# Patient Record
Sex: Female | Born: 1960 | Race: White | Hispanic: No | Smoking: Former smoker
Health system: Southern US, Community
[De-identification: ages and names within clinical notes are randomized; demographics above are authoritative.]

## PROBLEM LIST (undated history)

## (undated) DIAGNOSIS — S72141A Displaced intertrochanteric fracture of right femur, initial encounter for closed fracture: Secondary | ICD-10-CM

## (undated) DIAGNOSIS — R05 Cough: Secondary | ICD-10-CM

## (undated) DIAGNOSIS — R300 Dysuria: Secondary | ICD-10-CM

## (undated) DIAGNOSIS — R0600 Dyspnea, unspecified: Secondary | ICD-10-CM

## (undated) DIAGNOSIS — F32A Depression, unspecified: Secondary | ICD-10-CM

## (undated) DIAGNOSIS — I10 Essential (primary) hypertension: Secondary | ICD-10-CM

## (undated) DIAGNOSIS — Z8781 Personal history of (healed) traumatic fracture: Secondary | ICD-10-CM

## (undated) DIAGNOSIS — E785 Hyperlipidemia, unspecified: Secondary | ICD-10-CM

## (undated) DIAGNOSIS — M810 Age-related osteoporosis without current pathological fracture: Secondary | ICD-10-CM

## (undated) DIAGNOSIS — F419 Anxiety disorder, unspecified: Secondary | ICD-10-CM

## (undated) DIAGNOSIS — F250 Schizoaffective disorder, bipolar type: Secondary | ICD-10-CM

## (undated) DIAGNOSIS — E28319 Asymptomatic premature menopause: Secondary | ICD-10-CM

## (undated) DIAGNOSIS — J449 Chronic obstructive pulmonary disease, unspecified: Secondary | ICD-10-CM

## (undated) DIAGNOSIS — M519 Unspecified thoracic, thoracolumbar and lumbosacral intervertebral disc disorder: Secondary | ICD-10-CM

## (undated) DIAGNOSIS — F329 Major depressive disorder, single episode, unspecified: Secondary | ICD-10-CM

## (undated) DIAGNOSIS — E119 Type 2 diabetes mellitus without complications: Secondary | ICD-10-CM

## (undated) DIAGNOSIS — D649 Anemia, unspecified: Secondary | ICD-10-CM

## (undated) DIAGNOSIS — J9611 Chronic respiratory failure with hypoxia: Secondary | ICD-10-CM

## (undated) DIAGNOSIS — E871 Hypo-osmolality and hyponatremia: Secondary | ICD-10-CM

## (undated) DIAGNOSIS — J9622 Acute and chronic respiratory failure with hypercapnia: Secondary | ICD-10-CM

## (undated) DIAGNOSIS — D751 Secondary polycythemia: Secondary | ICD-10-CM

## (undated) DIAGNOSIS — J9621 Acute and chronic respiratory failure with hypoxia: Secondary | ICD-10-CM

## (undated) DIAGNOSIS — R32 Unspecified urinary incontinence: Secondary | ICD-10-CM

## (undated) DIAGNOSIS — K219 Gastro-esophageal reflux disease without esophagitis: Secondary | ICD-10-CM

## (undated) DIAGNOSIS — C519 Malignant neoplasm of vulva, unspecified: Secondary | ICD-10-CM

## (undated) DIAGNOSIS — I509 Heart failure, unspecified: Secondary | ICD-10-CM

## (undated) DIAGNOSIS — M25559 Pain in unspecified hip: Secondary | ICD-10-CM

## (undated) HISTORY — DX: Essential (primary) hypertension: I10

## (undated) HISTORY — DX: Depression, unspecified: F32.A

## (undated) HISTORY — DX: Major depressive disorder, single episode, unspecified: F32.9

## (undated) HISTORY — DX: Gastro-esophageal reflux disease without esophagitis: K21.9

## (undated) HISTORY — DX: Type 2 diabetes mellitus without complications: E11.9

## (undated) HISTORY — PX: PELVIC FRACTURE SURGERY: SHX119

## (undated) HISTORY — PX: EYE SURGERY: SHX253

---

## 1997-11-21 ENCOUNTER — Emergency Department (HOSPITAL_COMMUNITY): Admission: EM | Admit: 1997-11-21 | Discharge: 1997-11-21 | Payer: Self-pay | Admitting: Emergency Medicine

## 1997-11-25 ENCOUNTER — Emergency Department (HOSPITAL_COMMUNITY): Admission: EM | Admit: 1997-11-25 | Discharge: 1997-11-25 | Payer: Self-pay

## 1997-12-03 ENCOUNTER — Inpatient Hospital Stay (HOSPITAL_COMMUNITY): Admission: EM | Admit: 1997-12-03 | Discharge: 1997-12-17 | Payer: Self-pay | Admitting: Emergency Medicine

## 1998-01-24 ENCOUNTER — Emergency Department (HOSPITAL_COMMUNITY): Admission: EM | Admit: 1998-01-24 | Discharge: 1998-01-24 | Payer: Self-pay | Admitting: Internal Medicine

## 1998-02-25 ENCOUNTER — Emergency Department (HOSPITAL_COMMUNITY): Admission: EM | Admit: 1998-02-25 | Discharge: 1998-02-25 | Payer: Self-pay | Admitting: Emergency Medicine

## 1998-03-03 ENCOUNTER — Encounter: Admission: RE | Admit: 1998-03-03 | Discharge: 1998-06-01 | Payer: Self-pay | Admitting: Orthopedic Surgery

## 1998-07-12 ENCOUNTER — Inpatient Hospital Stay (HOSPITAL_COMMUNITY): Admission: EM | Admit: 1998-07-12 | Discharge: 1998-07-13 | Payer: Self-pay | Admitting: Emergency Medicine

## 1998-07-14 ENCOUNTER — Emergency Department (HOSPITAL_COMMUNITY): Admission: EM | Admit: 1998-07-14 | Discharge: 1998-07-14 | Payer: Self-pay | Admitting: Emergency Medicine

## 1998-08-30 ENCOUNTER — Ambulatory Visit: Admission: RE | Admit: 1998-08-30 | Discharge: 1998-08-30 | Payer: Self-pay | Admitting: Gynecology

## 1998-08-30 ENCOUNTER — Other Ambulatory Visit: Admission: RE | Admit: 1998-08-30 | Discharge: 1998-08-30 | Payer: Self-pay | Admitting: Gynecology

## 1998-12-25 ENCOUNTER — Inpatient Hospital Stay (HOSPITAL_COMMUNITY): Admission: EM | Admit: 1998-12-25 | Discharge: 1999-01-06 | Payer: Self-pay | Admitting: Emergency Medicine

## 1999-01-08 ENCOUNTER — Emergency Department (HOSPITAL_COMMUNITY): Admission: EM | Admit: 1999-01-08 | Discharge: 1999-01-08 | Payer: Self-pay | Admitting: Emergency Medicine

## 1999-02-01 ENCOUNTER — Emergency Department (HOSPITAL_COMMUNITY): Admission: EM | Admit: 1999-02-01 | Discharge: 1999-02-01 | Payer: Self-pay | Admitting: Emergency Medicine

## 1999-07-06 ENCOUNTER — Emergency Department (HOSPITAL_COMMUNITY): Admission: EM | Admit: 1999-07-06 | Discharge: 1999-07-06 | Payer: Self-pay | Admitting: Emergency Medicine

## 1999-08-20 ENCOUNTER — Emergency Department (HOSPITAL_COMMUNITY): Admission: EM | Admit: 1999-08-20 | Discharge: 1999-08-20 | Payer: Self-pay | Admitting: Emergency Medicine

## 1999-08-21 ENCOUNTER — Emergency Department (HOSPITAL_COMMUNITY): Admission: EM | Admit: 1999-08-21 | Discharge: 1999-08-21 | Payer: Self-pay | Admitting: Emergency Medicine

## 1999-08-24 ENCOUNTER — Emergency Department (HOSPITAL_COMMUNITY): Admission: EM | Admit: 1999-08-24 | Discharge: 1999-08-24 | Payer: Self-pay | Admitting: Emergency Medicine

## 1999-08-31 ENCOUNTER — Emergency Department (HOSPITAL_COMMUNITY): Admission: EM | Admit: 1999-08-31 | Discharge: 1999-08-31 | Payer: Self-pay | Admitting: Emergency Medicine

## 1999-09-02 ENCOUNTER — Encounter: Payer: Self-pay | Admitting: Emergency Medicine

## 1999-09-02 ENCOUNTER — Emergency Department (HOSPITAL_COMMUNITY): Admission: EM | Admit: 1999-09-02 | Discharge: 1999-09-02 | Payer: Self-pay | Admitting: Emergency Medicine

## 1999-09-04 ENCOUNTER — Emergency Department (HOSPITAL_COMMUNITY): Admission: EM | Admit: 1999-09-04 | Discharge: 1999-09-04 | Payer: Self-pay | Admitting: Emergency Medicine

## 1999-09-09 ENCOUNTER — Emergency Department (HOSPITAL_COMMUNITY): Admission: EM | Admit: 1999-09-09 | Discharge: 1999-09-09 | Payer: Self-pay

## 1999-09-11 ENCOUNTER — Emergency Department (HOSPITAL_COMMUNITY): Admission: EM | Admit: 1999-09-11 | Discharge: 1999-09-11 | Payer: Self-pay | Admitting: Emergency Medicine

## 1999-09-11 ENCOUNTER — Emergency Department (HOSPITAL_COMMUNITY): Admission: EM | Admit: 1999-09-11 | Discharge: 1999-09-12 | Payer: Self-pay | Admitting: Emergency Medicine

## 1999-09-12 ENCOUNTER — Encounter: Payer: Self-pay | Admitting: Emergency Medicine

## 1999-09-12 ENCOUNTER — Emergency Department (HOSPITAL_COMMUNITY): Admission: EM | Admit: 1999-09-12 | Discharge: 1999-09-12 | Payer: Self-pay | Admitting: Emergency Medicine

## 1999-09-19 ENCOUNTER — Ambulatory Visit: Admission: RE | Admit: 1999-09-19 | Discharge: 1999-09-19 | Payer: Self-pay | Admitting: Gynecology

## 1999-09-19 ENCOUNTER — Encounter (INDEPENDENT_AMBULATORY_CARE_PROVIDER_SITE_OTHER): Payer: Self-pay

## 1999-09-19 ENCOUNTER — Other Ambulatory Visit: Admission: RE | Admit: 1999-09-19 | Discharge: 1999-09-19 | Payer: Self-pay | Admitting: Gynecology

## 1999-09-29 ENCOUNTER — Emergency Department (HOSPITAL_COMMUNITY): Admission: EM | Admit: 1999-09-29 | Discharge: 1999-09-29 | Payer: Self-pay | Admitting: Emergency Medicine

## 1999-10-10 ENCOUNTER — Emergency Department (HOSPITAL_COMMUNITY): Admission: EM | Admit: 1999-10-10 | Discharge: 1999-10-10 | Payer: Self-pay | Admitting: *Deleted

## 1999-10-10 ENCOUNTER — Ambulatory Visit: Admission: RE | Admit: 1999-10-10 | Discharge: 1999-10-10 | Payer: Self-pay | Admitting: Gynecologic Oncology

## 1999-11-03 ENCOUNTER — Emergency Department (HOSPITAL_COMMUNITY): Admission: EM | Admit: 1999-11-03 | Discharge: 1999-11-03 | Payer: Self-pay | Admitting: Emergency Medicine

## 1999-11-10 ENCOUNTER — Ambulatory Visit (HOSPITAL_COMMUNITY): Admission: RE | Admit: 1999-11-10 | Discharge: 1999-11-10 | Payer: Self-pay | Admitting: Gynecology

## 1999-11-10 ENCOUNTER — Encounter: Payer: Self-pay | Admitting: Gynecology

## 1999-11-14 ENCOUNTER — Encounter (INDEPENDENT_AMBULATORY_CARE_PROVIDER_SITE_OTHER): Payer: Self-pay

## 1999-12-20 ENCOUNTER — Ambulatory Visit: Admission: RE | Admit: 1999-12-20 | Discharge: 1999-12-20 | Payer: Self-pay | Admitting: Gynecologic Oncology

## 2000-04-26 ENCOUNTER — Emergency Department (HOSPITAL_COMMUNITY): Admission: EM | Admit: 2000-04-26 | Discharge: 2000-04-26 | Payer: Self-pay | Admitting: Emergency Medicine

## 2000-05-14 ENCOUNTER — Emergency Department (HOSPITAL_COMMUNITY): Admission: EM | Admit: 2000-05-14 | Discharge: 2000-05-15 | Payer: Self-pay | Admitting: Emergency Medicine

## 2000-05-18 ENCOUNTER — Emergency Department (HOSPITAL_COMMUNITY): Admission: EM | Admit: 2000-05-18 | Discharge: 2000-05-18 | Payer: Self-pay | Admitting: Emergency Medicine

## 2000-06-11 ENCOUNTER — Other Ambulatory Visit: Admission: RE | Admit: 2000-06-11 | Discharge: 2000-06-11 | Payer: Self-pay | Admitting: Gynecology

## 2000-06-11 ENCOUNTER — Encounter (INDEPENDENT_AMBULATORY_CARE_PROVIDER_SITE_OTHER): Payer: Self-pay | Admitting: Specialist

## 2000-06-11 ENCOUNTER — Ambulatory Visit: Admission: RE | Admit: 2000-06-11 | Discharge: 2000-06-11 | Payer: Self-pay | Admitting: Gynecology

## 2000-07-31 ENCOUNTER — Encounter: Admission: RE | Admit: 2000-07-31 | Discharge: 2000-07-31 | Payer: Self-pay | Admitting: Otolaryngology

## 2000-07-31 ENCOUNTER — Encounter: Payer: Self-pay | Admitting: Otolaryngology

## 2000-08-25 ENCOUNTER — Inpatient Hospital Stay (HOSPITAL_COMMUNITY): Admission: EM | Admit: 2000-08-25 | Discharge: 2000-09-04 | Payer: Self-pay | Admitting: *Deleted

## 2000-09-06 ENCOUNTER — Emergency Department (HOSPITAL_COMMUNITY): Admission: EM | Admit: 2000-09-06 | Discharge: 2000-09-06 | Payer: Self-pay | Admitting: Emergency Medicine

## 2000-09-15 ENCOUNTER — Emergency Department (HOSPITAL_COMMUNITY): Admission: EM | Admit: 2000-09-15 | Discharge: 2000-09-15 | Payer: Self-pay | Admitting: Emergency Medicine

## 2000-09-20 ENCOUNTER — Emergency Department (HOSPITAL_COMMUNITY): Admission: EM | Admit: 2000-09-20 | Discharge: 2000-09-20 | Payer: Self-pay | Admitting: Emergency Medicine

## 2000-10-11 ENCOUNTER — Emergency Department (HOSPITAL_COMMUNITY): Admission: EM | Admit: 2000-10-11 | Discharge: 2000-10-12 | Payer: Self-pay | Admitting: Emergency Medicine

## 2000-10-20 ENCOUNTER — Emergency Department (HOSPITAL_COMMUNITY): Admission: EM | Admit: 2000-10-20 | Discharge: 2000-10-20 | Payer: Self-pay | Admitting: Emergency Medicine

## 2001-02-07 ENCOUNTER — Inpatient Hospital Stay (HOSPITAL_COMMUNITY): Admission: AD | Admit: 2001-02-07 | Discharge: 2001-02-18 | Payer: Self-pay | Admitting: *Deleted

## 2001-02-19 ENCOUNTER — Encounter: Payer: Self-pay | Admitting: Internal Medicine

## 2001-02-19 ENCOUNTER — Inpatient Hospital Stay (HOSPITAL_COMMUNITY): Admission: EM | Admit: 2001-02-19 | Discharge: 2001-02-24 | Payer: Self-pay | Admitting: Emergency Medicine

## 2001-02-25 ENCOUNTER — Emergency Department (HOSPITAL_COMMUNITY): Admission: EM | Admit: 2001-02-25 | Discharge: 2001-02-25 | Payer: Self-pay | Admitting: Emergency Medicine

## 2001-02-28 ENCOUNTER — Emergency Department (HOSPITAL_COMMUNITY): Admission: EM | Admit: 2001-02-28 | Discharge: 2001-03-01 | Payer: Self-pay | Admitting: Emergency Medicine

## 2001-04-08 ENCOUNTER — Encounter (INDEPENDENT_AMBULATORY_CARE_PROVIDER_SITE_OTHER): Payer: Self-pay | Admitting: *Deleted

## 2001-04-08 ENCOUNTER — Other Ambulatory Visit: Admission: RE | Admit: 2001-04-08 | Discharge: 2001-04-08 | Payer: Self-pay | Admitting: Gynecologic Oncology

## 2001-04-08 ENCOUNTER — Ambulatory Visit: Admission: RE | Admit: 2001-04-08 | Discharge: 2001-04-08 | Payer: Self-pay | Admitting: Gynecologic Oncology

## 2001-05-18 ENCOUNTER — Inpatient Hospital Stay (HOSPITAL_COMMUNITY): Admission: EM | Admit: 2001-05-18 | Discharge: 2001-05-19 | Payer: Self-pay | Admitting: Emergency Medicine

## 2001-05-18 ENCOUNTER — Encounter: Payer: Self-pay | Admitting: Internal Medicine

## 2001-07-24 ENCOUNTER — Inpatient Hospital Stay (HOSPITAL_COMMUNITY): Admission: EM | Admit: 2001-07-24 | Discharge: 2001-08-07 | Payer: Self-pay | Admitting: *Deleted

## 2001-09-16 ENCOUNTER — Ambulatory Visit: Admission: RE | Admit: 2001-09-16 | Discharge: 2001-09-16 | Payer: Self-pay | Admitting: Gynecologic Oncology

## 2001-09-16 ENCOUNTER — Other Ambulatory Visit: Admission: RE | Admit: 2001-09-16 | Discharge: 2001-09-16 | Payer: Self-pay | Admitting: Gynecology

## 2002-05-08 ENCOUNTER — Encounter: Payer: Self-pay | Admitting: *Deleted

## 2002-05-08 ENCOUNTER — Emergency Department (HOSPITAL_COMMUNITY): Admission: EM | Admit: 2002-05-08 | Discharge: 2002-05-09 | Payer: Self-pay | Admitting: *Deleted

## 2002-05-19 ENCOUNTER — Encounter: Payer: Self-pay | Admitting: Emergency Medicine

## 2002-05-19 ENCOUNTER — Emergency Department (HOSPITAL_COMMUNITY): Admission: EM | Admit: 2002-05-19 | Discharge: 2002-05-19 | Payer: Self-pay | Admitting: Emergency Medicine

## 2002-06-02 ENCOUNTER — Emergency Department (HOSPITAL_COMMUNITY): Admission: EM | Admit: 2002-06-02 | Discharge: 2002-06-02 | Payer: Self-pay | Admitting: Emergency Medicine

## 2002-06-20 ENCOUNTER — Emergency Department (HOSPITAL_COMMUNITY): Admission: EM | Admit: 2002-06-20 | Discharge: 2002-06-20 | Payer: Self-pay | Admitting: Emergency Medicine

## 2002-06-20 ENCOUNTER — Encounter: Payer: Self-pay | Admitting: *Deleted

## 2002-06-30 ENCOUNTER — Emergency Department (HOSPITAL_COMMUNITY): Admission: EM | Admit: 2002-06-30 | Discharge: 2002-06-30 | Payer: Self-pay | Admitting: Emergency Medicine

## 2002-10-08 ENCOUNTER — Inpatient Hospital Stay (HOSPITAL_COMMUNITY): Admission: EM | Admit: 2002-10-08 | Discharge: 2002-10-15 | Payer: Self-pay | Admitting: Emergency Medicine

## 2002-10-09 ENCOUNTER — Encounter: Payer: Self-pay | Admitting: Internal Medicine

## 2002-10-13 ENCOUNTER — Encounter: Payer: Self-pay | Admitting: Internal Medicine

## 2002-10-23 ENCOUNTER — Emergency Department (HOSPITAL_COMMUNITY): Admission: EM | Admit: 2002-10-23 | Discharge: 2002-10-24 | Payer: Self-pay | Admitting: Emergency Medicine

## 2002-10-24 ENCOUNTER — Encounter: Payer: Self-pay | Admitting: Emergency Medicine

## 2002-11-20 ENCOUNTER — Observation Stay (HOSPITAL_COMMUNITY): Admission: EM | Admit: 2002-11-20 | Discharge: 2002-11-21 | Payer: Self-pay

## 2002-12-28 ENCOUNTER — Inpatient Hospital Stay (HOSPITAL_COMMUNITY): Admission: EM | Admit: 2002-12-28 | Discharge: 2002-12-31 | Payer: Self-pay | Admitting: *Deleted

## 2003-02-05 ENCOUNTER — Ambulatory Visit (HOSPITAL_COMMUNITY): Admission: RE | Admit: 2003-02-05 | Discharge: 2003-02-05 | Payer: Self-pay | Admitting: Endocrinology

## 2003-02-05 ENCOUNTER — Encounter: Payer: Self-pay | Admitting: Endocrinology

## 2003-02-17 ENCOUNTER — Encounter: Payer: Self-pay | Admitting: Endocrinology

## 2003-02-17 ENCOUNTER — Encounter: Admission: RE | Admit: 2003-02-17 | Discharge: 2003-02-17 | Payer: Self-pay | Admitting: Endocrinology

## 2003-03-04 ENCOUNTER — Ambulatory Visit (HOSPITAL_COMMUNITY): Admission: RE | Admit: 2003-03-04 | Discharge: 2003-03-04 | Payer: Self-pay | Admitting: Endocrinology

## 2003-03-04 ENCOUNTER — Encounter: Payer: Self-pay | Admitting: Endocrinology

## 2003-04-16 ENCOUNTER — Inpatient Hospital Stay (HOSPITAL_COMMUNITY): Admission: EM | Admit: 2003-04-16 | Discharge: 2003-04-19 | Payer: Self-pay | Admitting: Emergency Medicine

## 2003-04-16 ENCOUNTER — Encounter: Payer: Self-pay | Admitting: Emergency Medicine

## 2003-08-15 ENCOUNTER — Emergency Department (HOSPITAL_COMMUNITY): Admission: EM | Admit: 2003-08-15 | Discharge: 2003-08-16 | Payer: Self-pay | Admitting: Unknown Physician Specialty

## 2003-09-07 ENCOUNTER — Other Ambulatory Visit: Admission: RE | Admit: 2003-09-07 | Discharge: 2003-09-07 | Payer: Self-pay | Admitting: Gynecology

## 2003-09-07 ENCOUNTER — Ambulatory Visit: Admission: RE | Admit: 2003-09-07 | Discharge: 2003-09-07 | Payer: Self-pay | Admitting: Gynecology

## 2003-10-04 ENCOUNTER — Ambulatory Visit (HOSPITAL_COMMUNITY): Admission: RE | Admit: 2003-10-04 | Discharge: 2003-10-04 | Payer: Self-pay | Admitting: Endocrinology

## 2003-11-26 ENCOUNTER — Emergency Department (HOSPITAL_COMMUNITY): Admission: EM | Admit: 2003-11-26 | Discharge: 2003-11-26 | Payer: Self-pay | Admitting: Emergency Medicine

## 2003-12-17 ENCOUNTER — Inpatient Hospital Stay (HOSPITAL_COMMUNITY): Admission: AD | Admit: 2003-12-17 | Discharge: 2003-12-22 | Payer: Self-pay | Admitting: Psychiatry

## 2004-01-10 ENCOUNTER — Emergency Department (HOSPITAL_COMMUNITY): Admission: EM | Admit: 2004-01-10 | Discharge: 2004-01-10 | Payer: Self-pay | Admitting: Emergency Medicine

## 2004-02-18 ENCOUNTER — Emergency Department (HOSPITAL_COMMUNITY): Admission: EM | Admit: 2004-02-18 | Discharge: 2004-02-18 | Payer: Self-pay | Admitting: Emergency Medicine

## 2004-05-16 ENCOUNTER — Emergency Department (HOSPITAL_COMMUNITY): Admission: EM | Admit: 2004-05-16 | Discharge: 2004-05-16 | Payer: Self-pay | Admitting: Emergency Medicine

## 2004-08-02 ENCOUNTER — Ambulatory Visit: Payer: Self-pay | Admitting: Endocrinology

## 2004-09-13 ENCOUNTER — Ambulatory Visit: Payer: Self-pay | Admitting: Endocrinology

## 2004-10-06 ENCOUNTER — Emergency Department (HOSPITAL_COMMUNITY): Admission: EM | Admit: 2004-10-06 | Discharge: 2004-10-06 | Payer: Self-pay | Admitting: Emergency Medicine

## 2004-10-10 ENCOUNTER — Ambulatory Visit: Payer: Self-pay | Admitting: Endocrinology

## 2004-12-07 ENCOUNTER — Ambulatory Visit: Payer: Self-pay | Admitting: Endocrinology

## 2005-01-15 ENCOUNTER — Ambulatory Visit: Payer: Self-pay | Admitting: Endocrinology

## 2005-02-17 ENCOUNTER — Ambulatory Visit: Payer: Self-pay | Admitting: Internal Medicine

## 2005-08-07 ENCOUNTER — Encounter: Payer: Self-pay | Admitting: Emergency Medicine

## 2005-08-08 ENCOUNTER — Inpatient Hospital Stay (HOSPITAL_COMMUNITY): Admission: AD | Admit: 2005-08-08 | Discharge: 2005-08-16 | Payer: Self-pay | Admitting: Psychiatry

## 2005-08-09 ENCOUNTER — Ambulatory Visit: Payer: Self-pay | Admitting: Psychiatry

## 2005-08-13 ENCOUNTER — Encounter: Payer: Self-pay | Admitting: Family Medicine

## 2005-08-15 ENCOUNTER — Encounter (INDEPENDENT_AMBULATORY_CARE_PROVIDER_SITE_OTHER): Payer: Self-pay | Admitting: Specialist

## 2005-08-15 ENCOUNTER — Other Ambulatory Visit: Admission: RE | Admit: 2005-08-15 | Discharge: 2005-08-15 | Payer: Self-pay | Admitting: Obstetrics and Gynecology

## 2005-09-15 ENCOUNTER — Inpatient Hospital Stay (HOSPITAL_COMMUNITY): Admission: EM | Admit: 2005-09-15 | Discharge: 2005-09-18 | Payer: Self-pay | Admitting: Internal Medicine

## 2005-09-18 ENCOUNTER — Inpatient Hospital Stay (HOSPITAL_COMMUNITY): Admission: EM | Admit: 2005-09-18 | Discharge: 2005-09-25 | Payer: Self-pay | Admitting: Psychiatry

## 2005-09-23 ENCOUNTER — Ambulatory Visit: Payer: Self-pay | Admitting: Psychiatry

## 2005-09-30 ENCOUNTER — Emergency Department (HOSPITAL_COMMUNITY): Admission: EM | Admit: 2005-09-30 | Discharge: 2005-09-30 | Payer: Self-pay | Admitting: Emergency Medicine

## 2006-01-15 ENCOUNTER — Emergency Department (HOSPITAL_COMMUNITY): Admission: EM | Admit: 2006-01-15 | Discharge: 2006-01-16 | Payer: Self-pay | Admitting: Emergency Medicine

## 2006-08-23 ENCOUNTER — Ambulatory Visit: Payer: Self-pay | Admitting: Endocrinology

## 2007-04-08 ENCOUNTER — Encounter: Payer: Self-pay | Admitting: *Deleted

## 2007-04-08 DIAGNOSIS — C519 Malignant neoplasm of vulva, unspecified: Secondary | ICD-10-CM

## 2007-04-08 DIAGNOSIS — I10 Essential (primary) hypertension: Secondary | ICD-10-CM

## 2007-04-08 DIAGNOSIS — E119 Type 2 diabetes mellitus without complications: Secondary | ICD-10-CM | POA: Insufficient documentation

## 2007-04-08 DIAGNOSIS — R300 Dysuria: Secondary | ICD-10-CM | POA: Insufficient documentation

## 2007-04-08 DIAGNOSIS — E28319 Asymptomatic premature menopause: Secondary | ICD-10-CM | POA: Insufficient documentation

## 2007-04-08 DIAGNOSIS — F259 Schizoaffective disorder, unspecified: Secondary | ICD-10-CM

## 2007-04-08 DIAGNOSIS — K219 Gastro-esophageal reflux disease without esophagitis: Secondary | ICD-10-CM

## 2007-04-08 DIAGNOSIS — J4489 Other specified chronic obstructive pulmonary disease: Secondary | ICD-10-CM

## 2007-04-08 DIAGNOSIS — J449 Chronic obstructive pulmonary disease, unspecified: Secondary | ICD-10-CM | POA: Insufficient documentation

## 2007-04-08 DIAGNOSIS — F329 Major depressive disorder, single episode, unspecified: Secondary | ICD-10-CM | POA: Insufficient documentation

## 2007-04-08 DIAGNOSIS — M810 Age-related osteoporosis without current pathological fracture: Secondary | ICD-10-CM

## 2007-04-08 DIAGNOSIS — E871 Hypo-osmolality and hyponatremia: Secondary | ICD-10-CM | POA: Insufficient documentation

## 2007-04-08 HISTORY — DX: Hypo-osmolality and hyponatremia: E87.1

## 2007-04-08 HISTORY — DX: Other specified chronic obstructive pulmonary disease: J44.89

## 2007-04-08 HISTORY — DX: Asymptomatic premature menopause: E28.319

## 2007-04-08 HISTORY — DX: Malignant neoplasm of vulva, unspecified: C51.9

## 2007-04-08 HISTORY — DX: Essential (primary) hypertension: I10

## 2007-04-08 HISTORY — DX: Chronic obstructive pulmonary disease, unspecified: J44.9

## 2007-04-08 HISTORY — DX: Age-related osteoporosis without current pathological fracture: M81.0

## 2007-09-04 ENCOUNTER — Inpatient Hospital Stay (HOSPITAL_COMMUNITY): Admission: EM | Admit: 2007-09-04 | Discharge: 2007-09-08 | Payer: Self-pay | Admitting: Emergency Medicine

## 2007-09-04 ENCOUNTER — Ambulatory Visit: Payer: Self-pay | Admitting: Internal Medicine

## 2007-09-12 ENCOUNTER — Inpatient Hospital Stay (HOSPITAL_COMMUNITY): Admission: EM | Admit: 2007-09-12 | Discharge: 2007-09-16 | Payer: Self-pay | Admitting: Emergency Medicine

## 2007-09-16 ENCOUNTER — Ambulatory Visit: Payer: Self-pay | Admitting: Psychiatry

## 2007-09-25 ENCOUNTER — Observation Stay (HOSPITAL_COMMUNITY): Admission: EM | Admit: 2007-09-25 | Discharge: 2007-09-26 | Payer: Self-pay | Admitting: Emergency Medicine

## 2007-10-01 ENCOUNTER — Encounter: Payer: Self-pay | Admitting: Endocrinology

## 2007-12-09 ENCOUNTER — Emergency Department (HOSPITAL_COMMUNITY): Admission: EM | Admit: 2007-12-09 | Discharge: 2007-12-09 | Payer: Self-pay | Admitting: Emergency Medicine

## 2007-12-12 ENCOUNTER — Ambulatory Visit: Payer: Self-pay | Admitting: Endocrinology

## 2007-12-12 DIAGNOSIS — M25559 Pain in unspecified hip: Secondary | ICD-10-CM

## 2007-12-15 ENCOUNTER — Encounter (INDEPENDENT_AMBULATORY_CARE_PROVIDER_SITE_OTHER): Payer: Self-pay | Admitting: *Deleted

## 2007-12-17 ENCOUNTER — Emergency Department (HOSPITAL_COMMUNITY): Admission: EM | Admit: 2007-12-17 | Discharge: 2007-12-17 | Payer: Self-pay | Admitting: Emergency Medicine

## 2008-01-01 ENCOUNTER — Inpatient Hospital Stay (HOSPITAL_COMMUNITY): Admission: RE | Admit: 2008-01-01 | Discharge: 2008-01-16 | Payer: Self-pay | Admitting: Neurosurgery

## 2008-01-01 ENCOUNTER — Encounter: Payer: Self-pay | Admitting: Emergency Medicine

## 2010-07-17 ENCOUNTER — Encounter: Payer: Self-pay | Admitting: Family Medicine

## 2010-11-07 NOTE — H&P (Signed)
NAME:  Natalie Thomas, Natalie Thomas            ACCOUNT NO.:  [id number]   MEDICAL RECORD NO.:  [id number]          PATIENT TYPE:  INP   LOCATION:  1444                         FACILITY:  [facility]   PHYSICIAN:  Kela Millin, M.D.DATE OF BIRTH:  Jul 10, 1960   DATE OF ADMISSION:  09/04/2007  DATE OF DISCHARGE:                              HISTORY & PHYSICAL   CHIEF COMPLAINT:  Altered mental status/hostile behavior.   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  The history is  obtained from ER staff and review of ER records secondary to her altered  mental status.  It is reported that the [facility] Police brought the  patient to the emergency room for medical clearance from [facility]  stating that she had been very aggressive and threatening to others.  Upon arrival, it was noted that the patient was agitated, shaking and in  handcuffs and was loud with agitated voice.  In the ER, lab work was  obtained for medical clearance and her sodium was 120 with a potassium  of 2.6, a urine drug screen was negative and she is admitted for further  evaluation and management.  She admits to drinking lots of water, but  otherwise, she does not answer the questions she is asked, very  talkative about unrelated matters.  She is admitted to the medicine  service for further evaluation and management of her electrolyte  imbalances.   PAST MEDICAL HISTORY:  As above.   MEDICATIONS:  1. Cardizem 180 mg daily.  2. Depakote  next episode 1500 mg nightly.  3. Protonix 40 mg daily.  4. Allegra 10 mg daily.  5. Topamax 100 mg b.i.d.  6. Trilafon 8 mg b.i.d.  7. Klonopin 0.25 mg b.i.d.  8. Flovent 110 mcg b.i.d.  9. Colace  100 mg daily.  10.Seroquel 300 mg at bedtime and 200 mg t.i.d.  11.Albuterol p.r.n.  12.Hydrochlorothiazide 25 mg daily.  13.Trilafon 16 mg nightly.  14.Vitamin D 1000 units daily.  15.Symbicort 2 puffs b.i.d.  16.Advair Diskus 100/50 b.i.d.   ALLERGIES:  PENICILLIN per old records.   SOCIAL HISTORY:  Per old records.  No tobacco and no alcohol.   FAMILY HISTORY:  Unobtainable.   PHYSICAL EXAMINATION:  GENERAL:  The patient is a middle-aged white  female, obese, in no apparent distress.  She is very talkative and  confrontational, but not answering the questions asked.  VITAL SIGNS:  Temperature is 97.3, blood pressure 148/81, pulse of 98,  respiratory rate of 20, O2 saturations 95%.  HEENT:  PERRLA, EOMI, sclerae anicteric.  LUNGS:  Decreased air entry, rhonchi present, no wheezes.  CARDIOVASCULAR:  Regular rate and rhythm.  Normal S1, S2.  ABDOMEN:  Obese, bowel sounds present, nontender, nondistended.  No  organomegaly and no masses palpable.  EXTREMITIES:  Trace edema.  No cyanosis.  NEUROLOGIC:  She is alert.  She does not  answer any of the questions  regarding orientation.  She does not follow commands.  Limited  neurologic exam as the patient is not corporative.   LABORATORY DATA AND X-RAY FINDINGS:  Chest x-ray with mild cardiomegaly,  no edema, COPD/emphysematous changes noted.   Urinalysis shows a specific gravity of 1.004, otherwise negative.  White  cell count is 8.4, hemoglobin is 16.2, hematocrit 46, platelet count is  251, neutrophil count 74%.  Urine drug screen is negative.  Dilantin  level is less than 2.5.  Salicylate level is less than 4.  Tylenol level  less than 10.  Sodium is 120, potassium 2.6, chloride 85, CO2 is 29,  glucose 137, BUN 3, creatinine 0.45, calcium is 9.7, total protein is  7.3.  Valproic acid is 65.  Alcohol level is less than 5.  No tricyclics  detected.   ASSESSMENT/PLAN:  1. Hyponatremia.  As discussed above, she has a history of  psychogenic      polydipsia and admits to drinking a lot of water.  She is also on      hydrochlorothiazide.  Will hold hydrochlorothiazide for now.      Obtain urine electrolytes.  Also, obtain a TSH.  Will initially      hydrate with normal saline, follow recheck and fluid restrict as      appropriate for further management.  2. Altered mental status.  Likely contributing, but the patient also      has schizoaffective disorder with multiple psychiatric admissions.      Urinalysis and chest x-ray are negative for infection.  Treat      hyponatremia as above.  Consult psychiatry once medically cleared      for a possible Behavioral Health transfer.  3. Hypokalemia.  Replace potassium (the patient has been on      hydrochlorothiazide as above).  4. Bronchitis/chronic obstructive pulmonary disease.  Nebulized      bronchodilators.  Add Mucinex.  Continue Advair and Flovent.  5. Hypertension.  Continue outpatient medications.  6. Epilepsy.  Continue outpatient medications.  7. Diabetes.  On no medications.  Will monitor Accu-Cheks.  Cover with      sliding scale and obtain a hemoglobin A1c.  Further manage as      appropriate.      Kela Millin, M.D.  Electronically Signed     ACV/MEDQ  D:  09/05/2007  T:  09/05/2007  Job:  [id number]   cc:   Gregary Signs A. Everardo All, MD  [address]  [name]  [place] [address]

## 2010-11-07 NOTE — Discharge Summary (Signed)
NAME:  Natalie Thomas, Natalie Thomas NO.:  [id number]   MEDICAL RECORD NO.:  [id number]          PATIENT TYPE:  INP   LOCATION:  1505                         FACILITY:  [facility]   PHYSICIAN:  Bennett Scrape, MD  DATE OF BIRTH:  1960-10-31   DATE OF ADMISSION:  09/04/2007  DATE OF DISCHARGE:  09/08/2007                               DISCHARGE SUMMARY   PRIMARY DISCHARGE DIAGNOSIS:  1. Hyponatremia from psychogenic polydipsia.  2. Hypertension.  3. Hypokalemia.  4. Schizoaffective disorder.  5. Chronic seizure disorder.  6. Tobacco abuse.  7. Chronic obstructive pulmonary disease.  8. Diet-controlled diabetes.  9. Obesity.   PROCEDURES DONE IN THIS HOSPITALIZATION:  Chest x-ray at the time of  admission  which showed COPD changes and mild cardiomegaly.   CONSULTANTS IN THIS HOSPITALIZATION:  Psychiatry.   HISTORY OF PRESENT ILLNESS:  Ms. Natalie Thomas is a 50 year old lady  who presented to the ER with altered mental status and very hostile  behavior on March 12.  She lives in a group home.  At the time of  admission to the ER, the patient was brought there in restraint by  police from [facility].  She lives in the group home, and  had been  very aggressive and threatening to other people.  Upon arrival, it was  noted that the patient was very agitated, shaking, was in handcuffs, was  loud with agitated voice.  In the ER, labs were drawn and showed her low  sodium at 120 with a low potassium of 2.6.  Drug screen was negative.  The patient did admit to drinking lots of water, especially diet  Coke.She had history of past admissions for hyponatremia from  psychogenic polydipsia.  She was admitted here for treatment of  hyponatremia, hypokalemia, and the mental status changes.  The patient  was also on  HCTZ, for hypertension and it was discontinued during this  hospitalization as HCTZ can worsen hyponatremia.  For hypokalemia, she  was given some potassium  supplement which improved that.   HOSPITAL COURSE:  The patient also got some IV fluids with normal  saline, and that improved her sodium from 127 at the time of admission  to being 134 today at the time of discharge.  She was also started on  fluid restrictionof only 1500 ml per day.  Her blood pressure was  elevated as HCTZ was discontinued.  Her Cardizem dose was increased from  180 mg to 240 mg which controlled her blood pressure.  For her  hypokalemia, she received oral potassium supplements, and today at the  time of discharge, her potassium is 4.7.  Her other medical problems  included COPD, epilepsy, obesity.  They all remained stable throughout  this hospitalization.  Accu-Cheks were done for her history of diabetes.  She was  put on a no concentrated sweets diet.  Her blood sugar remained  under 150 during this hospitalization.  Her hemoglobin A1c was obtained  which was 5.9%.  TSH was obtained which was normal at 0.968.  Her FENA  was low 0.0002% with serum osmolality being slightly low at  257.  The  uterine osmolality was very low at 78.  Psychiatry was consulted,  and  wanted to follow the patient as an outpatient and recommended discharge  of the patient to the assisted living.   DISCHARGE CONDITION:  Stable.  The patient is much calmer and awake and  alert on the current medication.   LABS AT THE TIME OF DISCHARGE:  Sodium of 134, potassium of 4.7,  chloride of 105, CO2 of 23, BUN of 6, creatinine of 0.5 with a glucose  of 109.   DIET:  She should follow a healthy-heart diet.  No concentrated sweets  and try to avoid any concentrated sugars which will affect her diabetes.   FOLLOW UP:  The patient wants to see her primary care Natalie Thomas at the  scheduled appointment.  She will also follow up with psychiatry.  She  should return to the ER if she develops any more confusion, fever, any  chest pain, any shortness of breath.   DISCHARGE MEDICATIONS:  1. Cardizem CD 240  mg p.o. q day.  2. Depakote 1500 mg q h.s.  3. Protonix 40 mg p.o. q d.  4. Claritin 10 mg daily.  5. Topamax 100 mg twice a day.  6. Trilafon 8 mg tablets, 2 tablets at bedtime.  7. Colace 100 mg tablets daily.  8. Seroquel 300 mg tablets at bedtime, and 200 mg tablets three times      a day.  9. Albuterol inhaler 2 puffs three times a day as needed.  10.Vitamin D 3 tablets 1000 unit tablets daily.  11.Symbicort 2 puffs inhaled twice a day.      Bennett Scrape, MD  Electronically Signed     VPC/MEDQ  D:  09/08/2007  T:  09/08/2007  Job:  [id number]

## 2010-11-07 NOTE — Discharge Summary (Signed)
NAME:  Natalie Thomas, SCHLAUCH NO.:  [id number]   MEDICAL RECORD NO.:  [id number]          PATIENT TYPE:  INP   LOCATION:  1518                         FACILITY:  [facility]   PHYSICIAN:  Bennett Scrape, MD  DATE OF BIRTH:  02-Jun-1961   DATE OF ADMISSION:  09/12/2007  DATE OF DISCHARGE:  09/16/2007                               DISCHARGE SUMMARY   Date of admission 09/12/07  Date of discharge 09/16/07  Attending physician- Dr Debby Bud.   PRIMARY DISCHARGE DIAGNOSIS:  Hyponatremia secondary to psychogenic  polydipsia.   OTHER DISCHARGE DIAGNOSES:  1. Hypertension  2. Diet-controlled diabetes.  3. Chronic obstructive pulmonary disease with history of tobacco      abuse.  4. History of chronic seizure disorder.  5. History of schizoaffective disorder.  6. Hypokalemia.   CONSULTANTS IN THIS HOSPITALIZATION:  None.   PROCEDURES DONE IN THIS HOSPITALIZATION:  A chest x-ray at the time of  admission which showed stable cardiomegaly with no active lung disease.   HISTORY OF PRESENT ILLNESS:  Ms. Natalie Thomas is a 50 year old female with  extensive psychiatric history of schizoaffective disorder and history of  multiple admissions in the past for hyponatremia and behavioral issues.  The patient currently lives in a group home.  She was discharged four  days ago prior to her readmission for this time.  She was sent from a  group home because of episodes of increasing agitation and violent  behavior.  The patient complained that she was not feeling well.   PHYSICAL EXAMINATION:  GENERAL:  The patient was obese, slightly  confused, talkative and did not appear in any acute distress.  VITAL SIGNS:  As follows:  Blood pressure 130/80, pulse of 88,  temperature of 98.  HEAD AND NECK EXAM:  Showed pupils equally reactive to light reflex.  NECK EXAM:  No JVD, no bruit, no thyromegaly was noted.  LUNG EXAM:  Clear.  There was no respiratory distress or wheezing.  CARDIOVASCULAR EXAM:  The  rate and rhythm were regular.  S1 and S2 were  normal.  ABDOMEN:  Soft, nontender, bowel sounds were present.  EXTREMITIES:  Showed mild 1+ edema with palpable pulses palpable.  NEUROLOGICAL EXAM:  Showed no motor deficit.  She was alert, although  she was confused.  Cranial nerves II-XII were intact.   LABS ON ADMISSION:  Showed white cell count of 7.2 with a hemoglobin of  16, hematocrit of 46.2.  Cardiac enzymes were all normal.  Serum alcohol  level was 5 which was low.  Chemistry showed a sodium of 118 and a  potassium of 3.4.  BUN was 4 and creatinine was 0.52.  She was  saturating 95% on room air.  The patient was diagnosed with severe  hyponatremia secondary to psychogenic polydipsia.  She was admitted for  this problem, and she was put on a no concentrated sweets diet with 1000  ml of fluid restriction.   HOSPITAL COURSE:  1. Severe hyponatremia secondary to polydipsia.  The patient's sodium      responded well to fluid restriction.  Her sodium improved from 118  to a high of 134, but the patient has been very noncompliant with      fluid restriction and keeps on asking for Diet Coke at her bedside.      Today at the time of discharge, her sodium is 128.  2. Hypertension.  Her blood pressure remains labile but is well      controlled on Cardizem of 240 mg q day.  3. History of COPD, she was continued on a p.r.n. inhaler, and that      remained stable.  4. History of schizoaffective disorder and chronic seizure disorder.      She was continued on her home medications.  5. Type 2 diabetes.  She was well controlled with diet only.  6. Hypokalemia.  She was given some extra potassium supplements.   LABORATORY DATA:  At the time of discharge: Sodium of 128, potassium of  4.3, BUN of 5, creatinine of 0.45, glucose of 77.   DISCHARGE CONDITION:  Stable.   DIET:  She should eat a healthy heart, no concentrated sweets diet.   FOLLOW UP:  The patient will follow up with her  physician and her  psychiatrist at the group home where she lives.  She will need basic  metabolic panel drawn in a week from discharge to follow up on her  hyponatremia and hypokalemia.  She should return to the ER if she  develops any fever of more than 101, any shortness of breath, any chest  pain, any seizures.   DISCHARGE MEDICATIONS:  1. Cardizem CD 240 mg p.o. q d.  2. Seroquel 100 mg tablets, 2 tablets three times a day.  3. Symbicort inhalers.  4. Albuterol inhaler three times a day as needed.  5. There is also an extra dose of Seroquel 100 mg 3 tablets at      bedtime.  6. Protonix 40 mg p.o. q d.  7. Topamax 100 mg twice a day.  8. Os-cal 500 mg three times a day.  9. Vitamin D tablet 1000 units daily.  10.Depakote ER 500 mg tablets three tablets at bedtime.  11.Theragran 1 tablet daily.  12.Perphenazine 4 mg tablets 2 tablets daily and 6 mg perphenazine      tablets at bedtime.  13.Her HCTZ has been discontinued because of her hyponatremia.  14.Clonazepam 0.5 mg twice a day.  15.Potassium chloride tablet 20 mEq tablet 1 tablet p.o. b.i.d.  16.Fluid restriction 1041ml/day. She needs to limit intake of diet coke  which she tends to drink excessively if kept at her bedside.      Bennett Scrape, MD  Electronically Signed     VPC/MEDQ  D:  09/16/2007  T:  09/16/2007  Job:  [id number]

## 2010-11-07 NOTE — H&P (Signed)
NAME:  Natalie Thomas, Natalie Thomas NO.:  [id number]   MEDICAL RECORD NO.:  [id number]          PATIENT TYPE:  INP   LOCATION:  1304                         [facility]  [facility]   PHYSICIAN:  Gordy Savers, MDDATE OF BIRTH:  [birth year]   DATE OF ADMISSION:  09/12/2007  DATE OF DISCHARGE:                              HISTORY & PHYSICAL   CHIEF COMPLAINT:  Confusion.   HISTORY OF PRESENT ILLNESS:  The patient is a 50 year old female with an  extensive psychiatric history significant for schizoaffective disorder  and history of multiple previous admissions for behavioral health  issues.  The patient was discharged from the hospital 4 days ago after  being admitted with altered mental status secondary to severe  hyponatremia resulting from psychogenic polydipsia.  The patient has  been a resident of [facility] and reportedly today became  much more agitated with episodes of crying and due to change in her  mental status was referred to the emergency department.  The patient  states that she feels unwell but is not able to give any specific  complaints other than the soles of her feet burning.  The patient was  admitted on September 04, 2007, for altered mental status and similar  hostile behavior.  At that time she became very aggressive and  threatening and was admitted with a serum sodium of 120 and potassium of  2.6.  Similarly today her serum sodium was 118 with a low normal serum  potassium.  The patient does freely admit to drinking copious amounts of  water.  She is now admitted for further evaluation and treatment of her  electrolyte abnormalities.   PAST MEDICAL HISTORY:  Is detailed in her prior admission history and  physical.  Medical problems include psychogenic polydipsia,  hypertension, schizoaffective disorder, chronic seizure disorder, COPD  and ongoing tobacco use.  She has a history of obesity and diet  controlled diabetes.   MEDICATIONS:  1.  Her medical regimen includes the following Cardizem CD 240 mg      daily.  2. Depakote 1500 mg at bedtime.  3. Protonix 40 mg daily.  4. Claritin 10 mg daily.  5. Topamax 100 mg b.i.d.  6. Trilafon 8 mg 2 tablets at bedtime.  7. Colace 100 mg daily.  8. Seroquel 300 mg at bedtime and 200 mg t.i.d.  9. Albuterol metered dose inhaler 2 inhalations 3x a day as needed.  10.Vitamin D 1000 units daily.  11.Symbicort 2 puffs b.i.d.   ALLERGIES:  Include PENICILLIN.   FAMILY HISTORY AND SOCIAL HISTORY:  May be obtained from old records  with a discharge date of September 08, 2007.   PHYSICAL EXAMINATION:  GENERAL:  Examination revealed a well-developed,  moderately obese white female who was slightly confused, talkative and  in no acute distress.  She was not belligerent or confrontational but  was confused and unable to give a coherent history.  VITAL SIGNS:  Blood pressure 130/80, pulse rate 88, temperature 98.  SKIN:  Warm and dry without rash.  HEAD AND NECK:  Revealed normal pupil responses.  Conjunctivae clear.  ENT  unremarkable.  Oral mucosal membranes were well hydrated.  NECK:  Neck was stocky, supple.  There was no adenopathy.  No thyroid  enlargement.  No bruits were appreciated.  CHEST:  Revealed scattered rhonchi.  There was no respiratory distress  or wheezing.  CARDIOVASCULAR:  Exam revealed normal rate and rhythm.  S1 and S2  normal.  No murmur appreciated.  ABDOMEN:  Obese, soft and nontender.  There was no organomegaly.  EXTREMITIES:  Revealed +1 peripheral edema.  Peripheral pulses were  full.  NEUROLOGICAL:  Exam was nonfocal.  She was alert although confused.  She  was able to move all extremities without difficulty.   LABORATORY STUDIES:  Included a CBC with a white count of 7.2,  hemoglobin 16, hematocrit 46.2.  CK-MB and troponins were normal.  Alcohol level was less than 5.  Chemistries revealed a serum sodium of  118 and a potassium of 3.4.  BUN and creatinine 4  and 0.52 respectively.  O2 saturation 95%.   IMPRESSION:  Severe hyponatremia secondary to psychogenic polydipsia.   ADDITIONAL DIAGNOSES:  Chronic schizoaffective disorder, hypertension,  history of type 2 diabetes, chronic obstructive pulmonary disease.   DISPOSITION:  The patient will be admitted to the hospital.  She will be  placed on a regular diet with 1000 mL of fluid restriction.  She will be  treated with normal saline and serial electrolytes will be monitored.      Gordy Savers, MD  Electronically Signed     PFK/MEDQ  D:  09/12/2007  T:  09/13/2007  Job:  [id number]

## 2010-11-07 NOTE — Discharge Summary (Signed)
Natalie Thomas, Natalie Thomas NO.:  [id number]   MEDICAL RECORD NO.:  [id number]          PATIENT TYPE:  INP   LOCATION:  3016                         FACILITY:  [facility]   PHYSICIAN:  Kathaleen Maser. Pool, M.D.    DATE OF BIRTH:  03-31-1961   DATE OF ADMISSION:  01/01/2008  DATE OF DISCHARGE:  01/13/2008                               DISCHARGE SUMMARY   FINAL DIAGNOSES:  1. 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  2. History of psychiatric illness, schizo-affective disorder.  3. Chronic obstructive pulmonary disease.  4. Coronary artery disease.  5. Diabetes mellitus.  6. Cancer of the vulva.   OPERATIONS AND TREATMENTS:  Percutaneous methyl methacrylate and  sacroplasty.   HOSPITAL COURSE:  The patient was admitted with a 1-month history of  severe lower back pain with bowel and bladder incontinence, and  inability to ambulate secondary to pain.  At that time, she was found to  have saddle anesthesia and loss of rectal tone.  She was incontinent of  both, urine and stool.  Workup demonstrated evidence of a subacute mid  sacral fracture with a kyphotic angulation.  The sacral canal itself was  reasonably well preserved and it was thought that the sacral  dysfunction, she was having was due to tension and traction on the  exiting nerve roots at these levels from her lumbosacral plexus.  The  patient was admitted for pain control and possible sacroplasty.  The  patient subsequently underwent a percutaneous sacroplasty, which  improved her pain somewhat, but not completely.  The fracture itself is  atypical and is not amenable to open repair.  At this point, it is our  feeling that she is best served by chronic bed rest with very minimal  activity level and narcotic pain medication for analgesia.  This is a  longstanding requirement.  She may continue to work with physical and  occupational therapy in terms of passive range of motion  and active  range of motion exercise as well in bed or a chair.  She may be up to a  bedside commode, but I do not foresee her being able to progress to  ambulation in the near future.   CONDITION AT DISCHARGE:  Stable.   DISCHARGE DISPOSITION:  The patient being discharged to a Skilled  Nursing Facility.   DISCHARGE ACTIVITIES:  As stated above.  She should have occupational  and physical therapy continued.   DISCHARGE MEDICATIONS:  1. Diltiazem 240 mg p.o. daily.  2. Claritin 10 mg p.o. daily.  3. Depakote 1500 mg p.o. at bedtime.  4. Protonix 40 mg p.o. daily.  5. Seroquel 300 mg p.o. at bedtime.  6. Seroquel 200 mg p.o. t.i.d.  7. Potassium 20 mEq p.o. daily.  8. Trilafon 8 mg p.o. daily.  9. Trilafon 4 mg p.o. at bedtime.  10.Klonopin 0.5 mg p.o. b.i.d.  11.Topamax 100 mg p.o. b.i.d.  12.Ventolin 2 puffs q.6 h. p.r.n.  13.Advair 250/50 one puff b.i.d.  14.Symbicort 2 puffs b.i.d.  15.Percocet 5/325, 1-2 tablets p.o. p.r.n. q.4 h.  16.Valium 5-10 mg q.6 h. p.r.n.  17.OxyContin 20 mg p.o. b.i.d.   DISCHARGE DISPOSITION:  The patient will follow up in my office in 3  months.           ______________________________  Kathaleen Maser. Pool, M.D.     HAP/MEDQ  D:  01/12/2008  T:  01/13/2008  Job:  [id number]

## 2010-11-07 NOTE — Discharge Summary (Signed)
NAME:  Natalie, Thomas NO.:  [id number]   MEDICAL RECORD NO.:  [id number]          PATIENT TYPE:  INP   LOCATION:  3740                         FACILITY:  [facility]   PHYSICIAN:  Valetta Mole. Swords, MD    DATE OF BIRTH:  [birth year]   DATE OF ADMISSION:  09/25/2007  DATE OF DISCHARGE:  09/26/2007                               DISCHARGE SUMMARY   DISCHARGE DIAGNOSES:  1. Atypical chest pain  2. Diabetes type 2 diet controlled.  3. History of chronic obstructive pulmonary disease with tobacco      abuse.  4. Seizure disorder.  5. Schizoaffective disorder.  6. 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  7. Carcinoma in situ of the vulva in 1998.   HISTORY OF PRESENT ILLNESS:  Ms. Natalie Thomas is a 50 year old female admitted  on September 25, 2007 with a chief complaint of chest pain.  She apparently  woke up on the morning of admission with left-sided chest pain which she  rated as 10 out of 10. There were no associated symptoms, no modifying  factors. This lasted for several hours and spontaneously resolved. There  were no known positive factors and no known trauma.  She denied a  previous history of chest pain.  She was admitted for further evaluation  and treatment.   PAST MEDICAL HISTORY:  1. Atypical chest pain likely musculoskeletal in origin.  The patient      was noted to be acutely tender in the anterior chest wall and today      her chest pain has resolved completely with no further anterior      chest wall tenderness.  Her serial cardiac enzymes have been      negative. There is one additional set pending late this morning. If      that is negative, we plan to have the patient return to skilled      nursing facility.  2. 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 Depakote ER may also be playing a role      in this. Will defer changes to the patient's psychiatrist and her      primary care.  3.  Diabetes type 2 diet controlled.  Her sugars remains 136-145 during      this admission. This will need continued outpatient follow-up.   DISCHARGE MEDICATIONS:  1. Cardizem CD 240 mg p.o. daily.  2. Depakote ER 1500 mg p.o. daily in the evening.  3. Protonix 40 mg p.o. daily.  4. Claritin 10 mg p.o. daily.  5. Topamax 100 mg p.o. b.i.d.  6. Docusate sodium 100 mg p.o. daily.  7. Seroquel 300 mg p.o. daily at bedtime and Seroquel 200 mg p.o.      t.i.d. during the day.  8. Vitamin D3 100 units p.o. once daily.  9. Symbicort 2 puffs twice daily.  10.Perphenazine 8 mg tabs 2 tabs p.o. q.h.s.  11.Clonazepam 0.5 mg p.o. b.i.d.  12.K-Dur 20 mEq p.o. b.i.d.   PERTINENT LABORATORY DATA AT DISCHARGE:  Cardiac enzymes negative x3.  One additional set  is pending at time of this dictation . Sodium 129,  BUN 6, creatinine 0.63, hemoglobin A1c 6.0.   DISPOSITION/PLAN:  Discharge the patient back to assisted living  facility as prior to admission.   FOLLOW UP:  The patient is scheduled follow up with Dr. Romero Belling on  April 8 at 3:10 p.m. and to return to the ER if she develop shortness of  breath or recurrent chest pain.      Sandford Craze, NP      Valetta Mole. Swords, MD  Electronically Signed    MO/MEDQ  D:  09/26/2007  T:  09/26/2007  Job:  [id number]   cc:   Gregary Signs A. Everardo All, MD

## 2010-11-07 NOTE — Consult Note (Signed)
NAME:  Natalie Thomas, ROSSEL NO.:  [id number]   MEDICAL RECORD NO.:  [id number]          PATIENT TYPE:  INP   LOCATION:  1518                         FACILITY:  [facility]   PHYSICIAN:  Anselm Jungling, MD  DATE OF BIRTH:  04/20/1961   DATE OF CONSULTATION:  09/16/2007  DATE OF DISCHARGE:                                 CONSULTATION   IDENTIFYING DATA/REASON FOR REFERRAL:  The patient is a 50 year old  single Caucasian female who lives at the [facility], an  assisted living facility for persons with mental illnesses.  [name]  has a long history of a psychotic disorder.  I have had contact with her  in the past.  On this occasion, psychiatric consultation is requested by  Dr. Debby Bud in relation to the patient's impending discharge and transfer  back to the community.   HISTORY OF PRESENTING PROBLEM:  I will not go into the patient's  psychiatric history in great detail, but in brief, she has a history of  schizoaffective disorder.  She is currently followed by the [facility] [place] (telephone [phone]) and is  on psychotropic regimen of antipsychotic and mood stabilizing  medications.  She has been living at the [facility] for  some time.   She is currently in treatment here at [facility] due to  hyponatremia which is the result of a longstanding behavioral trait that  she has of ingesting excessive liquids.  She does this on a daily basis  in the form of various types of soda and soft drinks, usually consumed  with ice.  This behavior has been a subject of concern for a long time  because in the past it has prompted other admissions for hyponatremia.  There have been many attempts to address this behavior and habit by  various care givers and counselors.  However because of the patient's  generally poor lack of insight and chronically poor impulse control,  this continues to be an ongoing  problem.   When she does experience hyponatremia, she tends to have episodes of  severe delirium and confusion, often characterized by agitated behavior  and visual hallucinations.   MENTAL STATUS/OBSERVATIONS:  The patient is an obese, normally statured,  well-nourished adult female who knows me well from previous contacts.  She is sitting on her bed.  She is reasonably groomed at this time.  She  tells me that she wants to go back to the [facility] and  asks me when she can.  Her moods are not mildly labile.  She becomes  tearful when discussing this.  She also expresses irritation when told  she must wait.  This is within the realm of what I did find to be  typical for [facility].   IMPRESSION:  [name] is a 50 year old woman with chronic  schizoaffective disorder and possibly a history of mild mental  retardation as well who has behavioral polydipsia that results in  recurrent episodes of hyponatremia requiring hospitalization and  associated with acute mental status changes.  At this time, her  condition appears to be  stabilized and it appears appropriate for her to  return to the [facility].   Earlier today, the nurse from the community ACT team was here and saw  the patient with me.  She will be in touch with the [facility] about the patient returning there today.   The patient does not require any inpatient psychiatric treatment at this  time and her same regimen of psychotropic medications is appropriate at  this time as well.   DIAGNOSTIC IMPRESSION:  AXIS I:  Schizoaffective disorder, not otherwise  specified.  AXIS II:  Deferred.  AXIS III:  See medical history.  AXIS IV:  Stressors severe.  AXIS V:  Global assessment of functioning 45.   RECOMMENDATIONS:  As above.  I feel it is reasonable for her to return  to the [facility] at this time.  Cell phone [phone].      Anselm Jungling, MD   Electronically Signed     SPB/MEDQ  D:  09/16/2007  T:  09/16/2007  Job:  [id number]

## 2010-11-09 ENCOUNTER — Emergency Department (HOSPITAL_COMMUNITY)
Admission: EM | Admit: 2010-11-09 | Discharge: 2010-11-09 | Disposition: A | Discharge status: Home / Self Care | Payer: Medicare Other | Attending: Emergency Medicine | Admitting: Emergency Medicine

## 2010-11-09 DIAGNOSIS — R142 Eructation: Secondary | ICD-10-CM | POA: Insufficient documentation

## 2010-11-09 DIAGNOSIS — G40802 Other epilepsy, not intractable, without status epilepticus: Secondary | ICD-10-CM | POA: Insufficient documentation

## 2010-11-09 DIAGNOSIS — J449 Chronic obstructive pulmonary disease, unspecified: Secondary | ICD-10-CM | POA: Insufficient documentation

## 2010-11-09 DIAGNOSIS — R319 Hematuria, unspecified: Secondary | ICD-10-CM | POA: Insufficient documentation

## 2010-11-09 DIAGNOSIS — M549 Dorsalgia, unspecified: Secondary | ICD-10-CM | POA: Insufficient documentation

## 2010-11-09 DIAGNOSIS — F319 Bipolar disorder, unspecified: Secondary | ICD-10-CM | POA: Insufficient documentation

## 2010-11-09 DIAGNOSIS — E119 Type 2 diabetes mellitus without complications: Secondary | ICD-10-CM | POA: Insufficient documentation

## 2010-11-09 DIAGNOSIS — Z8659 Personal history of other mental and behavioral disorders: Secondary | ICD-10-CM | POA: Insufficient documentation

## 2010-11-09 DIAGNOSIS — J4489 Other specified chronic obstructive pulmonary disease: Secondary | ICD-10-CM | POA: Insufficient documentation

## 2010-11-09 DIAGNOSIS — I10 Essential (primary) hypertension: Secondary | ICD-10-CM | POA: Insufficient documentation

## 2010-11-09 DIAGNOSIS — M810 Age-related osteoporosis without current pathological fracture: Secondary | ICD-10-CM | POA: Insufficient documentation

## 2010-11-09 DIAGNOSIS — R110 Nausea: Secondary | ICD-10-CM | POA: Insufficient documentation

## 2010-11-09 DIAGNOSIS — N898 Other specified noninflammatory disorders of vagina: Secondary | ICD-10-CM | POA: Insufficient documentation

## 2010-11-09 DIAGNOSIS — R141 Gas pain: Secondary | ICD-10-CM | POA: Insufficient documentation

## 2010-11-09 DIAGNOSIS — R143 Flatulence: Secondary | ICD-10-CM | POA: Insufficient documentation

## 2010-11-09 DIAGNOSIS — N390 Urinary tract infection, site not specified: Secondary | ICD-10-CM | POA: Insufficient documentation

## 2010-11-09 DIAGNOSIS — R109 Unspecified abdominal pain: Secondary | ICD-10-CM | POA: Insufficient documentation

## 2010-11-09 LAB — URINALYSIS, ROUTINE W REFLEX MICROSCOPIC
Bilirubin Urine: NEGATIVE
Ketones, ur: NEGATIVE mg/dL
Nitrite: NEGATIVE
pH: 6 (ref 5.0–8.0)

## 2010-11-09 LAB — CBC
MCH: 30.3 pg (ref 26.0–34.0)
MCHC: 34.6 g/dL (ref 30.0–36.0)
MCV: 87.6 fL (ref 78.0–100.0)
Platelets: 205 10*3/uL (ref 150–400)
RBC: 5.55 MIL/uL — ABNORMAL HIGH (ref 3.87–5.11)

## 2010-11-09 LAB — POCT PREGNANCY, URINE: Preg Test, Ur: NEGATIVE

## 2010-11-09 LAB — URINE MICROSCOPIC-ADD ON

## 2010-11-09 LAB — COMPREHENSIVE METABOLIC PANEL
ALT: 9 U/L (ref 0–35)
Albumin: 3.6 g/dL (ref 3.5–5.2)
Alkaline Phosphatase: 89 U/L (ref 39–117)
Glucose, Bld: 122 mg/dL — ABNORMAL HIGH (ref 70–99)
Potassium: 3.7 mEq/L (ref 3.5–5.1)
Sodium: 131 mEq/L — ABNORMAL LOW (ref 135–145)
Total Protein: 7.3 g/dL (ref 6.0–8.3)

## 2010-11-09 LAB — DIFFERENTIAL
Eosinophils Absolute: 0.2 10*3/uL (ref 0.0–0.7)
Eosinophils Relative: 1 % (ref 0–5)
Lymphs Abs: 2.8 10*3/uL (ref 0.7–4.0)
Monocytes Absolute: 0.9 10*3/uL (ref 0.1–1.0)
Monocytes Relative: 7 % (ref 3–12)
Neutrophils Relative %: 72 % (ref 43–77)

## 2010-11-09 LAB — WET PREP, GENITAL: Yeast Wet Prep HPF POC: NONE SEEN

## 2010-11-10 NOTE — H&P (Signed)
[facility]  Patient:    Natalie Thomas, Natalie Thomas Visit Number: [id number] MRN: [id number]          Service Type: EMS Location: ED Attending Physician:  Sandi Raveling Dictated by:   Natalie Thomas, M.D. [facility] Admit Date:  05/18/2001   CC:         Natalie Thomas, M.D.  Natalie Thomas, M.D.  Natalie Thomas, M.D.   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  Actually this is almost a carbon copy of her admission of February 19, 2001, of this year.  Despite that hospitalization for hyponatremia, she has continued to drink excessive amounts of ice water.  She indicates that she may drink more than four gallons of ice water per day.  She came to the emergency room because of trembling and shaking with some chilling with visual changes.  She describes "seeing black."  She is a diabetic which is treated with Actos.  Supposedly her glucose this morning was 117, but she admits she is not checking her sugars regularly.  She also has polyuria, cut denies polyphagia.  HOME MEDICATIONS:  Effexor, Klonopin, Depakote, Haldol, Cogentin, Protonix, and Actos.  ALLERGIES:  She states that she is allergic to PENICILLIN.  SOCIAL HISTORY:  She smokes more than a pack per day.  Her reply when asked if she smoked was "too much."  She does not drink.  She is unemployed based on her disability for mental illness.  She is separated.  Gravida 0, para 0.  PAST MEDICAL HISTORY:  Ophthalmologic surgery.  She had a vulvar malignant "wart" and is followed by Natalie Thomas, M.D.  Apparently she has seen him recently.  She actually mentions his name as "Stanford Breed."  She states that her psychiatrist is Natalie Thomas, M.D.  When  in the hospital, she had seen Natalie Thomas, M.D.  FAMILY HISTORY:  COPD in both parents.  Her grandfather had colon cancer.  Her father has hypertension and obesity.  REVIEW OF SYSTEMS:  Positive for "sacks" in her ears.  She has yeast under her breasts.  She has a cough, but has been unable to produce any sputum to describe the character.  As noted, she has had some chills and fever.  PHYSICAL EXAMINATION:  On exam, she appears somewhat lethargic and is somewhat low in responses.  VITAL SIGNS:  Her blood pressure is 195/100, temperature 99 degrees, respiratory rate 16, and pulse 94.  HEENT:  The pupils are markedly dilated.  There is arteriolar narrowing.  The teeth are stained anteriorly, but otherwise hygiene is fair to good.  There are edematous-type changes with hypervascularity of the tympanic membranes.  NECK:  She has no carotid bruits.  LYMPHATICS:  There is no lymphadenopathy about the head, neck, or axilla.  LUNGS:  Breath sounds are decreased with isolated wheezing, which is fairly symmetric.  HEART:  She has a distant S4 with no murmur.  ABDOMEN:  Protuberant with striae.  Candidal dermatitis is noted under the breasts.  BREASTS, GYNECOLOGICAL, AND RECTAL:  Exams are deferred as they are not germane to this admission.  NEUROLOGIC:  She has no localizing neurologic signs, but is sedated.  She is slow to respond and seems somewhat lethargic.  The cranial nerve exam is normal with the exception of lateral deviation  of the left eye with accommodation.  The deep tendon reflexes are 1-1/2+.  PULSES:  The pedal pulses are intact.  There are no pulse deficits.  SKIN:  Warm and dry.  There are no other lesions other than the inframammary areas.  LABORATORY DATA:  The EKG reveals poor R-wave progression in V1 and V2.  There are no ischemic changes and no dysrhythmias.  The white count is 8100, hematocrit 44.7, and platelet count 282,000 with a normal  differential.  Her valproic acid level (Depakene) is 69.5, which is low therapeutic.  Liver functions are normal.  Her sodium was 119 with a chloride of 86.  Her BUN was 2 and creatinine 0.4.  She will be admitted to telemetry if available.  IV fluids will be restricted. The blood pressure will be treated with low-dose multiple antihypertensives. She will receive pulmonary toilet.  A nicotine patch will be prescribed.  A psychiatric consultation is indicated as she continues to ingest excessive amounts of fluid and we are simply treating the signs and complications of that rather than the underlying process, i.e. her excessive polydipsia which does not appear to be related to uncontrolled diabetes.  Additionally, she describes a craving for ice water, but there is no anemia.  To facilitate continuity of care, copies of this will be sent to Natalie Thomas, M.D., and Natalie Thomas, M.D., psychiatrists, and to Rande Brunt. Thomas, M.D., gynecology. Dictated by:   Natalie Thomas, M.D. [facility] Attending Physician:  Sandi Raveling DD:  05/18/01 TD:  05/18/01 Job: [id number]

## 2010-11-10 NOTE — H&P (Signed)
NAME:  Natalie, Thomas NO.:  [id number]   MEDICAL RECORD NO.:  [id number]          PATIENT TYPE:  INP   LOCATION:  3028                         FACILITY:  [facility]   PHYSICIAN:  Sherin Quarry, MD      DATE OF BIRTH:  1960/09/03   DATE OF ADMISSION:  09/15/2005  DATE OF DISCHARGE:                                HISTORY & PHYSICAL   HISTORY OF PRESENT ILLNESS:  Natalie Thomas is a 50 year old lady who is a  resident of [facility].  Natalie Thomas has a longstanding history of  schizoaffective disorder has been admitted to [facility] on  multiple occasions with acute confusion and agitation.  She is on numerous  medications as described below.  Apparently, Natalie Thomas was brought over to  the [employer], arriving at about midnight, with  profound the confusion and agitation.  During her initial workup, she was  found to have marked hyponatremia with a sodium of 116, potassium of 3.3.  We are asked to admit her to the medical service in light of this finding.  [name] is probably an unreliable historian, but she specifically denies  any recent history of headache, visual blurring, breathing difficulty, chest  pain, vomiting, diarrhea or abdominal pain.  She states that she has had no  dysuria or urinary frequency.  She states that she has been taking her  medications regularly.  It is uncertain whether this is, in fact, accurate.   PAST MEDICAL HISTORY:   CURRENT MEDICATIONS:  1.  Cardizem 180 mg daily.  2.  Depakote ER 2 gm at bedtime daily.  3.  Protonix 40 mg daily.  4.  Allegra 180 mg daily.  5.  Colace 100 mg daily.  6.  Topamax 50 mg t.i.d.  7.  Seroquel 200 mg b.i.d. and 400 mg at bedtime.  8.  Trilafon 4 mg two b.i.d. and four at bedtime.  9.  Glucotrol XL 10 mg daily.  10. Albuterol 2 puffs q.i.d.  11. Lasix 60 mg twice daily.  12. K-Dur 20 mEq b.i.d.  13. Lisinopril 10 mg in the morning.  14. Risperdal 2 mg b.i.d.  15. Cogentin; the dose of this is not certain.   ALLERGIES:  There are no known drug allergies.   OPERATIONS:  According to the patient, she has had a previous gallbladder  operation.  She says that she has not had any other surgical procedures.   She states that she has never been hospitalized for any medical illnesses  although her past records suggest that she has a history of epilepsy.   MEDICAL ILLNESSES:  According to the chart, she has a past history of:  1.  COPD.  2.  Diabetes.  3.  Epilepsy.   FAMILY HISTORY:  Cannot be obtained.   SOCIAL HISTORY:  The patient's records indicate that she does not smoke or  abuse drugs or alcohol.   PHYSICAL EXAMINATION:  GENERAL:  Natalie Thomas is a very agitated lady who  claims that she is being held in a prison.  She refuses to allow the nurses  to examine  her or place an IV.  HEENT:  Within normal limits.  CHEST:  Clear.  BACK:  No CVA or point tenderness.  CARDIOVASCULAR:  Normal S1-S2 without rubs, murmurs or gallops.  ABDOMEN:  I was only able to do a cursory abdominal exam.  Her bowel sounds  seemed to be normal.  I really was not able to assess whether she had any  tenderness because of her resistance.  NEUROLOGIC:  I observed that she was moving all extremities.  She responds  to painful stimuli.  Station and gait could not be tested.  Cerebellar could  not be tested.  She is oriented only to name.  She believes that she is in a  prison.  She is not really clear about what the date is.  EXTREMITIES:  1+ pretibial edema.   IMPRESSION:  1.  Hyponatremia, possibly chronic.  2.  Schizoaffective disorder.  3.  Possible history of diabetes.  4.  Chronic obstructive pulmonary disease.  5.  Morbid obesity.  6.  History of epilepsy.   PLAN:  The patient will be admitted to the hospital at this time.  We will  restrict fluid intake, except for isotonic saline if she will agree to  placing an IV.  I will try to sedate her with  Haldol and continue her  psychiatric medications.  The assistance of a psychiatrist will be necessary  for her care.           ______________________________  Sherin Quarry, MD     SY/MEDQ  D:  09/15/2005  T:  09/17/2005  Job:  [id number]

## 2010-11-10 NOTE — Discharge Summary (Signed)
NAME:  Natalie Thomas, Natalie Thomas                      ACCOUNT NO.:  [id number]   MEDICAL RECORD NO.:  [id number]                   PATIENT TYPE:  INP   LOCATION:  5524                                 FACILITY:  [facility]   PHYSICIAN:  Dineen Kid. Reche Dixon, M.D.               DATE OF BIRTH:  [birth year]   DATE OF ADMISSION:  10/08/2002  DATE OF DISCHARGE:  10/14/2002                                 DISCHARGE SUMMARY   PRIMARY CARE PHYSICIAN:  Titus Dubin. Alwyn Ren, M.D. [facility]   DISCHARGE DIAGNOSES:  1. 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  2. Schizoaffective disorder with mood instability.  3. Diabetes mellitus, type 2.  4. Chronic bronchitis.  5. Ongoing tobacco abuse.  6. Gastroesophageal reflux disease.   DISCHARGE MEDICATIONS:  1. Cogentin 1 mg p.o. daily.  2. Klonopin 0.5 mg p.o. daily.  3. Depakote 1000 mg p.o. b.i.d.  4. Effexor 75 mg p.o. daily x7 days, then 37.5 mg p.o. daily x7 days, then     discontinue.  5. Lexapro 5 mg p.o. daily x7 days, the 10 mg p.o. daily.  6. Haldol 5 mg p.o. q.a.m., 10 mg p.o. at bedtime.  7. Rosiglitazone 2 mg p.o. daily.  8. Protonix 40 mg p.o. daily.  9. Vioxx 25 mg p.o. daily.  10.      Albuterol MDI two puffs q.4-6 h. p.r.n.   DISPOSITION:  The patient will be discharged to an assisted living or  nursing home facility of her choice with the assistance of her aunt.   FOLLOWUP:  At this time, the patient's followup is not entirely clear.  She  may be followed by the physicians at the rest home or nursing home of these  are available.  If not, the patient may be followed by Dr. Marga Melnick if  she is still active with his practice.  If not, she can be seen in the  outpatient clinic at [facility].  This issue will be clarified  prior to the patient's discharge.  Additionally, the patient will require  further followup per [facility].  The [facility] team will be  notified of her discharge.   ISSUES TO FOLLOW:  1. The  most important issue in regards to this patient's outpatient followup     is in regard to her fluid restriction.  The patient is to be restricted     to 1 L of fluid per day.  This should be in the form primarily of     Gatorade or other sodium-rich fluids.  The patient is not to consume     large amounts of free water or she will decompensate and return to the     hospital in a state not unlike that in which she initially presented.     Again, it is critical that she be limited to 1 L of fluid per day and     that this not  be in the form of free water but rather in the form of high-     sodium fluids including Gatorade.  Additionally, the patient should be     given generous use of salt with her foods.  2. Psychiatric medications:  The patient has had some titration of her     psychiatric medications during this hospitalization.  She should continue     to be followed by Dr. Katrinka Blazing for further evaluation of her psychiatric     conditions and medication regimen.  3. Hyponatremia:  The patient is on fluid restriction secondary to     hyponatremia.  It may be prudent to check a BMET periodically to ensure     that she is not over consuming free water causing another psychogenic     polydipsia.   CONSULTATIONS:  Dr. Dub Mikes, with psychiatry, was consulted regarding this  patient for possible consideration of inpatient admission.  It was his  impression that this was not needed at this time.  He did make suggestions  regarding medication changes that were carried out during the  hospitalization.  We certainly appreciate his assistance with this case.   HISTORY OF PRESENT ILLNESS:  The patient is a 50 year old Caucasian female  with past medical history significant for schizoaffective disorder and  psychogenic polydipsia with two previous admissions to [facility]  secondary to the above who presented to the emergency department as a  referral from her psychiatric physician who found the  patient had episodes  of nausea and vomiting and over-consumption of water.  Per the patient, she  has approximately a one month history of nausea with increased intake of  diet soft drinks and water.   PHYSICAL EXAMINATION:  VITAL SIGNS:  Temperature 98.6, pulse 82, blood  pressure 90/52, respiratory rate 18, O2 saturation 96% on room air.  GENERAL:  The patient was somewhat groggy and slightly irritable upon  presentation.  She was arousable and alert and oriented x3.  She was in no  acute distress.  HEENT:  Sclerae were clear.  Nares were patent.  Oropharynx was clear  without erythema or exudates.  The patient's mucous membranes were moist.  NECK:  Supple.  RESPIRATORY:  The patient had wheezing bilaterally with good air movement  and able to speak in full sentences.  No rhonchi or crackles were  appreciated.  CARDIOVASCULAR:  Regular rate and rhythm without murmurs, gallops, or rubs.  ABDOMEN:  Soft, nontender, nondistended with normal bowel sounds.  The  patient is obese.  EXTREMITIES:  No edema.  NEUROLOGICAL:  Again, the patient was somewhat lethargic but easily  arousable.  No focal neurologic deficits were appreciated.  PSYCHIATRIC:  The patient was quite irritable upon initial presentation.   ADMISSION LABORATORY DATA:  White blood cell count 13.1, hemoglobin 14.2,  hematocrit 40.7, platelets 200.  Sodium 101, potassium 2.5, chloride 67,  bicarb 26, BUN 3, creatinine 0.4, glucose 98.  Total bilirubin 0.7, alkaline  phosphatase 84, AST 34, ALT 15, protein 5.5, albumin 2.7, calcium 7.5,  magnesium 1.7, phosphorus 3.0.  Serum osmolarity 200.  Urine osmolarity 63.  Urine sodium was less than 10.  Urine drug screen was positive for opiates  and negative for benzodiazepines.  Depakote level as 52.4.   EKG revealed normal sinus rhythm with a rate of 84.   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  The patient was admitted and placed on  fluid restrictions.  She  had frequent BMETs checked to follow her sodium to ensure that it did not  rise too quickly secondary to the possibility of central pontine __________  and brain edema.  Her sodium corrected slowly.  There was also some question  that the psychiatric medications may be contributing to psychomotor IDH thus  leading to hyponatremia.  Therefore, her Effexor was being tapered.  At the  time of discharge, this taper is continued in the outpatient setting.  To  replace this, the patient was started on Lexapro, and this medication is  being tapered up appropriately.  Also, it is likely the patient has a reset  __________ with a slightly hyponatremic baseline.  This should be followed  in the outpatient setting.  It will certainly take a while for the patient's  sodium to equilibrate entirely.  At the time of discharge, the patient is  awake and alert and oriented x3.  She is able to ambulate without a  therapist.  She is felt to be at her baseline.  Additionally during the  patient's initial hospitalization, she was monitored on telemetry and  followed closely with seizure precautions and frequent neurologic  evaluations.   Problem #2:  Schizoaffective disorder.  The patient has a long history of  psychiatric illness that is followed by [facility].  It  was felt that their input would be beneficial here.  Dr. Dub Mikes came and  evaluated the patient and made some recommendations regarding her  medications.  He did not feel that she met the criteria for inpatient  admission at this time.  Certainly she will require further titration of  psychiatric medications and further monitoring of psychiatric disorders in  the outpatient setting.   Problem #3:  Diabetes mellitus, type 2.  The patient was monitored with  frequent CBGs during hospitalization.  Her CBGs remained within control  simply on the Rosiglitazone 2 mg p.o. daily.  Her hemoglobin A1c was  checked  and was within normal limits at 5.5.   Problem #4:  Chronic bronchitis.  The patient was continued on p.r.n.  albuterol nebulizers throughout the hospitalization.  Her wheezing was  improved on this regimen.  She will be discharged home on albuterol MDIs.   Problem #5:  Tobacco abuse.  The patient was counseled regarding affects of  tobacco abuse on her health.  She was not permitted to smoke during this  hospitalization.  She was encouraged to continue with cessation in the  outpatient setting.   Problem #6:  Gastroesophageal reflux disease.  The patient was continued on  proton pump inhibitor throughout this hospitalization.  She will be  discharged home on the same.   DISCHARGE LABORATORY DATA:  Sodium 126, potassium 4.6, chloride 91, bicarb  26, BUN 13, creatinine 0.6, glucose 116, calcium 8.6.     Marijean Bravo, M.D.         Dineen Kid Reche Dixon, M.D.    CAM/MEDQ  D:  10/13/2002  T:  10/13/2002  Job:  [id number]

## 2010-11-10 NOTE — Discharge Summary (Signed)
NAME:  Natalie Thomas, Natalie Thomas                      ACCOUNT NO.:  [id number]   MEDICAL RECORD NO.:  [id number]                   PATIENT TYPE:  INP   LOCATION:  5530                                 FACILITY:  [facility]   PHYSICIAN:  C. Ulyess Mort, M.D.             DATE OF BIRTH:  [birth year]   DATE OF ADMISSION:  11/20/2002  DATE OF DISCHARGE:  11/21/2002                                 DISCHARGE SUMMARY   DISCHARGE DIAGNOSES:  1. Hyponatremia secondary to polydipsia.  2. Schizoaffective disorder with mood instability.  3. Diabetes type 2.  4. Chronic bronchitis.  5. History of dysuria.  6. Tobacco use.  7. Gastroesophageal reflux disease.  8. History of chest pain with no evidence of coronary artery disease.  9. 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  10.      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   DISCHARGE MEDICATIONS:  1. Cogentin 1 mg p.o. daily.  2. Klonopin 0.5 mg p.o. daily.  3. Depakote 1000 mg p.o. b.i.d.  4. Lexapro 10 mg p.o. daily.  5. Haldol 5 mg p.o. q.a.m., 10 mg p.o. q.h.s.  6. Rosiglitazone 2 mg p.o. daily.  7. Protonix 40 mg p.o. daily.  8. Vioxx 25 mg p.o. daily.  9. Albuterol inhaler two puffs q.4h p.r.n. shortness of breath.   DISPOSITION:  The patient was discharged back to her care facility in stable  condition and she will be called with an appointment for the outpatient  clinic and was instructed to call the clinic if she is not notified within  the next several days.   HISTORY OF PRESENT ILLNESS:  For full details of admission history and  physical, please see the chart.  Briefly, the patient is a 50 year old white  female with a history of secondary polydipsia with hyponatremia status post  multiple admissions, also with a history of schizoaffective disorder with  mood instability, who presents from her psychiatrist's office to have her  sodium checked.  Per the patient, her  psychiatrist wanted her to have her  sodium checked because she had not had it checked since her recent discharge  on October 13, 2002, for hyponatremia.  The patient has the complaint of  fatigue, but otherwise is without complaint.  She denies any visual changes,  dizziness, or syncope.  She does have the complaint of dysuria on review of  systems and she does have a history of urinary tract infection.   PHYSICAL EXAMINATION:  VITAL SIGNS:  Temperature 98.3, pulse 71,  respirations 20, blood pressure 151/75, O2 saturation 97% on room.  GENERAL:  The patient is lying in bed, sleeping intermittently during the  examination and conversation.  HEENT:  Pupils equal, round, and reactive to light and accommodation.  Sclerae were white, oropharynx was clear and moist.  LUNGS:  Clear to auscultation bilaterally with the  exception of very mild  wheezes and good air movement.  HEART:  Regular rate and rhythm with no murmurs, rubs, or gallops.  ABDOMEN:  Soft and nontender and nondistended with positive bowel sounds.  EXTREMITIES:  Warm with no cyanosis, clubbing, or edema.  NEUROLOGY:  Nonfocal.   Basic metabolic profile in the emergency department showed a sodium of 121,  potassium 3.3, chloride 88, bicarbonate 26, BUN 3, creatinine 0.4, glucose  95, calcium 85.   The patient was admitted to the medical teaching service for evaluation and  treatment of her hyponatremia.   HOSPITAL COURSE:  Problem 1.  Hyponatremia.  This was thought to be  secondary to polydipsia. A serum osmolality was obtained which was low at  252.  A urine osmolality was ordered in the emergency department, however,  that was never obtained.  The patient was fluid restricted and given normal  saline at a rate of 50 mL an hour, the morning after admission.  Her repeat  metabolic profile showed a sodium of 132 supporting the diagnosis of  psychogenic polydipsia.  The patient admitted to Dr. Aundria Rud that she drinks  a lot of  Diet Coke, however, she was somewhat angry when he suggested that  this is the reason that she has a low sodium. Of note, the patient has had a  recent chest x-ray that shows no mass to be concerning for SIADH in a  smoker. Furthermore, she had a cortisol drawn during her last admission that  was within normal limits.  Overnight, during this hospitalization, the  patient produced 2 liters of urine output which again supports the diagnosis  of psychogenic polydipsia with resolution of her hyponatremia.   Problem 2.  Diabetes.  The patient was continued on her rosiglitazone for  control of her diabetes.  CBG the day of discharge was 83.   Problem 3.  Questionable history of a low TSH.  The patient had a low TSH  during her last admission.  T4 was not checked at that time.  TSH and T4  were both repeated and were within normal limits during this admission.   Problem 4.  Dysuria. A urinalysis was ordered during this admission,  however, it was not obtained and we will obtain a urinalysis before  discharging the patient and will follow up on this as an outpatient.   Problem 5.  Schizoaffective disorder.  The patient was continued on her  antipsychotic medications as well as on her other psychiatric drugs.  A  valproic acid level showed a therapeutic level of Depakote.   Problem 6.  COPD.  The patient was given albuterol inhalers p.r.n.   Problem 7.  High blood pressure.  The patient had one elevated blood  pressure reading upon admission. She has no history of hypertension and  blood pressures were controlled throughout the remainder of her  hospitalization and noted to have temperature.   DISCHARGE LABORATORY DATA:  Basic metabolic profile shows a sodium of 132,  potassium 3.5, chloride 98, bicarbonate 25, BUN 3, creatinine 0.4, glucose  85, calcium 8.4, SGOT 15, SGPT 10, alkaline phosphatase 67, total bilirubin 0.4, total protein 6.0, albumin 3.0. CBC shows white blood cell count 6.8,   hemoglobin 13.5, platelets  217,000, MCV 94, 56% neutrophils, 33% lymphocytes, 7% monocytes, 3%  eosinophils, 1% basophil. TSH 1.023, free T4 1.37, valproic acid level 5.5,  magnesium 2.0, serum osmolality 252.  Urinalysis is pending at the time of  discharge.  Michel Harrow, M.D.                        Gary Fleet, M.D.    KB/MEDQ  D:  11/21/2002  T:  11/21/2002  Job:  [id number]

## 2010-11-10 NOTE — Discharge Summary (Signed)
[place]  Patient:    Natalie Thomas, Natalie Thomas Visit Number: [id number] MRN: [id number]          Service Type: GON Location: GYN Attending Physician:  Jeannette Corpus Adm. Date:  02/07/01 Disc. Date: 02/18/01                             Discharge Summary    BRIEF HISTORY: [name] is a 50 year old white female who was admitted on involuntary           onset of psychosis. She drove herself to the hospital complaining of nausea and pelvic pain. She was seen in the Emergency Room where no major pathology was          , was transferred to Center.  Two days prior to reporting to the Emergency Room, the patient was discharged from          .  She has a history of mental illness.  Her speech in the Emergency Room was confusing and pulse showed  During this evaluation, she refused to speak and behaving quite bizarre. Previously, she has been followed by          Mental Health  She has a history of         medication regular.  She has a history of diabetes Type II and  Upon admission, she supposed to be taking  At [facility], she was medically clear with physical examination unremarkable.  INITIAL IMPRESSION:  Sycosis not has always specified  HOSPITAL COURSE:  After admitted to the ward, the patient was placed on special sedation . She was started on Depakote t.r., 500 mg  She was seen over the weekend by           . She still remains agitated, but on Risperdal which was in the meantime increased to 2 mg three times a day.  On 12/11/2000, she was doing better but still expressed some delusions of her brain was      tape player and still hearing voices played by this player. Attending Physician:  Jeannette Corpus DD:    /  / TD:  04/03/01 Job: [id number]

## 2010-11-10 NOTE — H&P (Signed)
[facility]  Patient:    Natalie Thomas, Natalie Thomas Visit Number: [id number] MRN: [id number]          Service Type: PSY Location: 400 [id number] 02 Attending Physician:  Denny Peon Dictated by:   Young Berry Scott, N.P. Admit Date:  07/24/2001                     Psychiatric Admission Assessment  DATE OF ASSESSMENT:  July 25, 2001 at 8:50 a.m.  IDENTIFYING INFORMATION:  This is a 50 year old Caucasian female, who is an involuntary admission.  HISTORY OF PRESENT ILLNESS:  This patient, with a long history of schizoaffective disorder and a history of psychogenic polydipsia, reported to [facility] for medical clinic and was agitated, yelling, religiously preoccupied and stating that her brother had impregnated her mother.  Family had also reported that she had not showered in several days because she feared that her brother was hiding somewhere in the house.  Today, the patient states "I got off my medicine and my mother is very sick."  She answers correctly as to her identity and her location but otherwise she seems to be quite sedated and is also emotionally withdrawn and is unable to answer other questions.  PAST PSYCHIATRIC HISTORY:  The patient is followed by Dr. Elna Breslow at [facility] and by the [facility] team.  She has a history of multiple admissions with her last one being at [facility] sometime in summer of 2002.  She has a history of two admissions this fall to medicine for psychogenic polydipsia, both in September 2002 and in November of 2002.  She also has a history of prior admissions to [facility] in [facility].  SOCIAL HISTORY:  Unclear at this point since patient is unable to really provide any background information at this time.  FAMILY HISTORY:  Unclear.  ALCOHOL/DRUG HISTORY:  The patient has a history of polysubstance abuse according to the previous record  but, at this time, her urine drug screen is negative.  MEDICAL HISTORY:  The patients last known primary care Wajiha Versteeg was Dr. Vilinda Blanks. [place].  Medical problems are diabetes mellitus type 2 and GERD.  The patient also has a history of multiple admissions for psychogenic polydipsia.  MEDICATIONS:  Klonopin 0.5 mg p.o. t.i.d., Depakote 500 mg, 2 at h.s., Effexor XR 75 mg, 2 in the morning, Haldol 5 mg p.o. b.i.d., Cogentin 1 mg p.o. b.i.d. The patient also reports that she takes oral contraceptive tablets but is unclear on what these are and also takes Protonix daily and she takes some type of ear drop.  The patients medication information was provided by [facility] in terms of her psychiatric medications and her medication compliance is unclear.  She states she went off her medications a couple of days ago.  However, her valproic acid level is noted at less than 1. The patient also takes Actos and, according to previous medical records, she has been on 15 mg.  DRUG ALLERGIES:  PENICILLIN.  POSITIVE PHYSICAL FINDINGS:  The patients PE was done in the emergency room. On admission, her temperature was 98.1, pulse 82, respirations 22, blood pressure 148/83.  Her PE was generally unremarkable.  LABORATORY DATA:  Her sodium is noted at 134 when she was in the emergency room.  Her BUN 4, creatinine 0.6.  SGOT 18, SGPT 12.  She had no somatic complaints at that time.  Her WBC is noted at 12.2 and her neutrophils are mildly shifted upwards at 8.2 but she continues to deny any somatic complaints.  Her routine urinalysis is negative.  MENTAL STATUS EXAMINATION:  We were not able to do a full mental status examination on this patient at this time.  She did rouse up and was cooperative with mild pressure to her speech and, at that time, she attempted to cooperate, was able to say her name and accurately name the hospital where she was and reported that she was off her  medications for two days and that her mother is very sick.  However, then she falls back to sleep and is snoring and not rousable.  DIAGNOSES: Axis I:    1. Rule out schizoaffective disorder.            2. History of polysubstance abuse. Axis II:   Deferred. Axis III:  1. Diabetes mellitus type 2.            2. History of psychogenic polydipsia.            3. History of bronchitis secondary to smoking. Axis IV:   Moderate (problems with repeated medication noncompliance). Axis V:    Current 25; past year 65.  PLAN:  Involuntarily admit the patient to stabilize her mood with 15-minute checks in place.  Our goal is to improve her hygiene and activities of daily living functioning as appropriate.  We will consider whether or not she is safe to return to independent living or if assisted living would be more appropriate.  We also plan to improve her reality testing so we can eliminate any paranoid delusions that she may have.  We will restart her on her Effexor at 75 mg daily.  However, instead of the 75 mg b.i.d., because it does appear that she has been quite noncompliant with her medications, we will start her on oral fluid restriction of 40 ounces daily.  Since her sodium at this time is 134, it is on the low side of normal and she is already headed for the water fountain.  This 40-ounce fluid restriction was what was recommended by medicine in November of 2002.  Meanwhile, we have placed her on a 1800-calorie ADA diet and we will check her CBGs twice daily.  The patients baseline glucose, on admission, on her CMET was noted at 93.  We will attempt to check her medications either with her pharmacy or primary care M.D. to verify that.  ESTIMATED LENGTH OF STAY:  Five to seven days. Dictated by:   Young Berry Scott, N.P. Attending Physician:  Denny Peon DD:  07/25/01 TD:  07/26/01 Job: [id number]

## 2010-11-10 NOTE — Discharge Summary (Signed)
[facility]  Patient:    Natalie Thomas, Natalie Thomas Visit Number: [id number] MRN: [id number]          Service Type: PSY Location: [phone] Attending Physician:  Rachael Fee Dictated by:   Reymundo Poll Dub Mikes, M.D. Admit Date:  07/24/2001 Discharge Date: 08/07/2001                             Discharge Summary  CHIEF COMPLAINT AND HISTORY OF PRESENT ILLNESS:  This was one of multiple admissions to [facility] for this 50 year old female involuntarily committed.  History of schizoaffective disorder, history of psychogenic polydipsia.  She reported to the [facility].  She was agitated, yelling, religiously preoccupied, stating that her brother had impregnated her mother.  Reported that she had not showered in several days.  She feared that her brother was hiding somewhere in the house. The patient stated that she got off medications and her mother was very sick. Answered correctly as to her name and her location; otherwise she seemed to be quite sedated and also emotionally withdrawn, unable to answer questions.  PAST PSYCHIATRIC HISTORY:  Dr. Katrinka Blazing at [facility] and by the PACT Team.  History of multiple admissions, last being at [facility] summer 2002.  History of prior admissions to [facility].  SUBSTANCE ABUSE HISTORY:  History of polysubstance abuse according to previous record but not at the time of the admission.  Urine drug screen was negative.  PAST MEDICAL HISTORY:  Diabetes mellitus type 2 and gastroesophageal reflux. Multiple admissions for psychogenic polydipsia.  MEDICATIONS ON ADMISSION: 1. Klonopin 0.5 mg three times a day. 2. Depakote 500 mg two at bedtime. 3. Effexor XR 75 mg two in the morning. 4. Haldol 5 mg twice a day. 5. Cogentin 1 mg twice a day.  PHYSICAL EXAMINATION:  GENERAL:  Performed and failed  to show any acute findings.  MENTAL STATUS EXAMINATION ON ADMISSION:  Well-developed, overweight female, mostly sedated.  When awake, mildly pressured speech.  Admitted to being off the medications and upset because her mother was sick.  ADMITTING DIAGNOSES: Axis I:    Schizoaffective disorder, depressed. Axis II:   No diagnosis. Axis III:  1. Diabetes mellitus type 2.            2. Psychogenic polydipsia.            3. Bronchitis. Axis IV:   Moderate. Axis V:    Global assessment of functioning upon admission 25, highest global            assessment of functioning in the last year 64.  HOSPITAL COURSE:  She was admitted and started in intensive individual and group psychotherapy.  Other laboratory workup: CBC was within normal limits, hemoglobin 15.8, sodium 133.  Thyroid profile was within normal limits. Valproic acid was less than 0.1.  Urine drug screen for substances of abuse. The patient had two more ______ while in the hospital, initially wanted to go home.  Then she was notified that her mother had died, became very upset with the way they handled it.  She was debating about going to the funeral or not. It was decided that she was better off not going.  Then she started to become more anxious, irritable, religiously preoccupied.  She was placed on Zyprexa. She had gotten notification from her family  that they wanted her somehow to be placed.  Felt that the family was not being supportive, was affecting her negatively.  Became more irritable as we went on.  Felt abandoned by her father.  She was placed on Abilify that was increased up to 30 mg per day and her Depakote was adjusted.  As she continued to respond to the medications, Zyprexa had been discontinued, Abilify was increased to 30 mg per day, Depakote was increased to 1000 mg at night.  She was kept on the Klonopin. She was kept on the Haldol and the Cogentin as well as the Effexor.  Slowly, she started getting better.   On February 12 she was ready to go home, no active psychosis, had been more euthymic in her mood, anxious about being discharged but felt strong enough to be safe outside, willing to follow up on an outpatient basis.  She was going to go into [facility] as it was felt that she would be better at an assisted living facility, something that she agreed to.  DISCHARGE DIAGNOSES: Axis I:    Schizoaffective disorder, depressed. Axis II:   No diagnosis. Axis III:  1. Diabetes mellitus type 2.            2. Psychogenic polydipsia by history.            3. Bronchitis by history. Axis IV:   Moderate. Axis V:    Global assessment of functioning upon discharge 55.  DISCHARGE MEDICATIONS: 1. Haldol 5 mg twice a day. 2. Cogentin 1 mg twice a day. 3. Protonix 40 mg daily. 4. Actos 15 mg daily. 5. Effexor XR 75 mg daily. 6. Abilify 15 mg two at bedtime. 7. Klonopin 0.5 mg twice daily and 2 mg at bedtime. 8. Depakote ER 500 mg two at bedtime.  FOLLOWUP:  [facility]. Dictated by:   Reymundo Poll Dub Mikes, M.D. Attending Physician:  Rachael Fee DD:  09/10/01 TD:  09/10/01 Job: [id number]

## 2010-11-10 NOTE — Discharge Summary (Signed)
[facility]  Patient:    Natalie Thomas, Natalie Thomas Visit Number: [id number] MRN: [id number]          Service Type: MED Location: [id number] [phone] Attending Physician:  Dolores Patty Dictated by:   Titus Dubin. Alwyn Ren, M.D. [facility] Admit Date:  05/18/2001 Discharge Date: 05/19/2001   CC:         Natalie Thomas, M.D.   Discharge Summary  ADMITTING DIAGNOSES: 1. Hyponatremia secondary to polydipsia, psychogenic. 2. Asthmatic bronchitis with continued smoking. 3. Hypertension.  DISCHARGE DIAGNOSES: 1. Hyponatremia, reversed. 2. Asthmatic bronchitis, improved. 3. Hypertension, controlled.  BRIEF HISTORY:  Natalie Thomas is a 50 year old white female who has long-standing psychiatric problems who is now admitted for the second time with hyponatremia secondary to psychogenic polydipsia.  She presented with trembling, shaking, and difficulty sleeping.  She admitted to drinking four gallons of ice water or more per day.  She feels compelled to do this.  Other symptoms included some confusion, visual dysfunction.  She is a noninsulin-requiring diabetic on Actos; her glucose was 117 the morning of admission.  There were no localizing findings on the admission exam, except for some slurring of her speech and _____ deviation of the left eye with accommodation.  Blood pressure initially was 195/100.  Her admitting sodium was 119.  She received normal saline IV and had oral fluid restriction.  The sodium quickly corrected.  Within 12 hours, it was 126 and on the morning of discharge, it was 136.  Her chloride rose from 86 to 100.  Creatinine improved from 2 to 6.  She did have decreased breath sounds on exam compatible with her COPD.  She had a nonproductive cough.  Chest x-ray revealed no active process, but chronic bronchitic changes.  EKG revealed normal sinus rhythm.  White count was normal.  Hematocrit was 44.7.  Valproic acid level was  60.5 (therapeutic 50-100).  Liver enzymes were normal.  She was monitored on telemetry and had no significant dysrhythmias.  Nicotine patch was applied to prevent nicotine withdrawal.  On the morning of admission, she was somewhat lethargic, but oriented x3.  The pathophysiology of hyponatremia secondary to polydipsia was discussed.  It was stressed that she should restrict her oral fluids to less than 40 ounces per day.  Because of the obsessive-compulsive behaviors here which has led to recurrent hyponatremia and admission x2, I have encouraged her to see Dr. Rozanna Box as soon as possible, at least within the next 2-3 weeks.  No change was made in her home medications of Effexor, Klonopin, Depakote, Haldol, Cogentin, Protonix, and Actos.  Because of her hypertension, she was discharged on clonidine 0.1 mg twice a day.  Perhaps this might be beneficial in smoking cessation if she so chose.  For the same reasons, Nicotine patch was also offered as an alternative to continuing smoking.  Her glucoses were well controlled.  It is anticipated if she optimizes her weight to 170 or less, then she will not need therapy Actos.  I recommended the Weight Watchers book Simply the Best, and hospital glucoses were 82-104 with no hypoglycemia.  She did have inframammary Candidiasis despite the normal glucoses.  Nizoral, topically, was recommended once daily.  DISCHARGE STATUS:  Improved.  PROGNOSIS:  Her prognosis depends on her compliance with these recommendations. Dictated by:   Titus Dubin. Alwyn Ren, M.D. [facility] Attending Physician:  Dolores Patty DD:  05/19/01 TD:  05/19/01 Job: [id number]

## 2010-11-10 NOTE — Op Note (Signed)
[facility]  Patient:    Natalie Thomas, Natalie Thomas                   MRN: [id number] Proc. Date: 11/14/99 Adm. Date:  [id number] Attending:  Jeannette Corpus CC:         Telford Nab, R.N.                           Operative Report  PREOPERATIVE DIAGNOSIS:  High-grade squamous intraepithelial lesion of the cervix.  POSTOPERATIVE DIAGNOSIS:  High-grade squamous intraepithelial lesion of the cervix.  PROCEDURE:  Cold knife conization.  SURGEON:  Daniel L. Clarke-Pearson, M.D.  ASSISTANT:  Telford Nab, R.N.  ANESTHESIA:  General with orotracheal tube.  ESTIMATED BLOOD LOSS:  10 cc.  SURGICAL FINDINGS:  Examination under anesthesia revealed a small, normal-appearing cervix and uterus.  No adnexal masses were noted.  The patient had previously undergone colposcopy and the lesion was located anteriorly at 12 oclock.  DESCRIPTION OF PROCEDURE:  The patient was brought to the operating room and after satisfactory attainment of general anesthesia, she was placed in the lithotomy position in candy cane stirrups.  The perineum and vagina were prepped with Betadine and the patient was draped.  A weighted speculum was placed in the posterior vagina and the bladder was elevated with a Deaver retractor.  The cervix was grasped with a tenaculum and sutures were placed at 3 and 9 oclock in the cervix in order to achieve control of the cervical branch of the uterine artery.  Lugol solution was applied.  The cervix was injected with 10 cc of dilute 1% Xylocaine with epinephrine.  A circumferential incision was then made around the entire lesion.  A suture was placed in the cervical specimen at 12 oclock and tied.  Additional sutures at 3, 9, and 6 oclock were placed for traction.  The remainder of the conization was performed using a scalpel, tapering the cone toward the cervical canal. The specimen was submitted to pathology with the suture marked at 12  oclock. The cone bed was cauterized using electrocautery.  Hemostasis was excellent. The tenaculum was removed.  The patient was awaken from anesthesia and taken to the recovery room in satisfactory condition.  The sponge, needle, and instrument counts were correct x 2. DD:  11/14/99 TD:  11/14/99 Job: [id number]

## 2010-11-10 NOTE — H&P (Signed)
[facility]  Patient:    Natalie Thomas, Natalie Thomas                   MRN: [id number] Adm. Date:  [id number] Disc. Date: [id number] Attending:  Shelba Flake Dictator:   Young Berry Lorin Picket, N.P.                   Psychiatric Admission Assessment  DATE OF ADMISSION:  February 07, 2001.  IDENTIFYING INFORMATION:  This is a 50 year old Caucasian female who is an involuntary admission for disorganized thoughts.  HISTORY OF THE PRESENT ILLNESS:  The patient drove herself to the hospital yesterday, reportedly complaining of nausea and pelvic pain.  She was treated with a GI cocktail there and was noted to have confused thinking, with loose associations.  The patient was medically cleared and subsequently transferred to [facility].  The patient reportedly was discharged from [facility] only 2 days prior to reporting to the emergency room. It was noted in the petition that the patient has a long history of mental illness and that her speech in the emergency room was confusing and her thoughts reflecting loose associations.  The patient was involuntarily committed for her own safety.  Today, the patient is lying in bed with her eyes closed and says little.  She opens her eyes intermittently but is unwilling to speak.  She states "I thought my father and my mother were in here."  She does deny any suicidal thoughts today when asked.  She also denies any pain at this time.  It is noted that patient has voided on herself twice in the emergency room and has voided on herself twice since she arrived here.  PAST PSYCHIATRIC HISTORY:  Patient is followed by Dr. Katrinka Blazing at [facility] and is also followed by the PACT team who make visits to her home.  Patient has a history of prior inpatient admissions including to [facility] in March of 2002.  Patient also has history of prior admissions to [facility] in 2000 and reportedly earlier this month, immediately prior to admission here.  Patient has a history of major depression and borderline personality disorder, along with bipolar disorder.  SOCIAL HISTORY:  Little information is available at this time.  It is noted that the patient is single.  She lives alone.  She is on disability due to her mental illness, and the mental health staff are her primary source of support.  FAMILY HISTORY:  Positive for an aunt who has committed suicide, and both her fathers and mothers sides of the family have a history of ETOH abuse.  ALCOHOL AND DRUG HISTORY:  Patient has been unable to supply any history in this regard.  It is noted that her urine drug screen was positive for barbituates and PCP.  PAST MEDICAL HISTORY:  Patient is followed by Dr. Alwyn Ren whose phone number is [phone].  She was last seen there September 10, 2000, at his office, according to his records which we have had faxed over here.  Patient has not been seen there since this time.  According to her history she has a history of diabetes mellitus type 2 and GERD.  Medications:  Patient has reported on admission that she takes Depakote, Effexor, Risperdal, and Actos for her diabetes.  We were unable to get specific doses from her.  We will ask the case manager to call and follow up with  mental health.  Telephone call to her primary care physician and review of her previous meds list indicate that she is on Actos 15 mg p.o. q.d. for her diabetes, and Prilosec 20 mg q.d.  DRUG ALLERGIES:  PENICILLIN.  POSITIVE PHYSICAL FINDINGS:  Patient was seen in the [facility] Emergency Department, complaining of nausea, vomiting and pelvic pain.  It is noted at that time that her white count was mildly elevated at 13.9.  Her physical examination is unremarkable.  She was treated with a GI cocktail prior to transfer over here.  Her temp on admission here was 97.4, pulse 99, respirations 20.   Her blood pressure was 116/76.  Her urine drug screen already noted as positive for barbituates and PCP.  Her sodium was a bit low at 124 and her potassium was at 3.4.  Her urine HCG was negative, and her Depakote level this morning is noted at 42.3, and she has trace of hemoglobin in her urine.  Her BUN was less than 5, and her creatinine is 0.5.  MENTAL STATUS EXAMINATION:  This is a sleepy Caucasian female who is in bed under the covers.  Her affect is blunt.  She opens her eyes intermittently but says only very little.  Speech is soft.  Minimal words are offered.  She is aloof and withdrawn.  Her mood is unable to determine, other than the fact that she is quite withdrawn.  Her thought process is confused.  Her responses are nonsensical to most questions.  Cognitively, she is oriented to name, refuses to respond to other questions or orientation.  We are unable to further assess this.  ADMISSION DIAGNOSES: Axis I:    1. Psychosis not otherwise specified.            2. Bipolar disorder by history.            3. Major depression by history. Axis II:   Borderline personality disorder by history. Axis III:  Gastroesophageal reflux disease and diabetes mellitus type 2. Axis IV:   Deferred. Axis V:    Current is 30, and highest this past year is 80.  INITIAL PLAN OF CARE:  To admit the patient involuntarily to stabilize her mood and clear her thinking, with the goal of improving her reality testing. We will push fluids to increase her hydration and will recheck her electrolytes in the morning.  Since her white blood cell count was also elevated, we will check her vital signs q.i.d. for the first 24 hours.  We will also recheck a CBC in the morning.  We will resume her previous medications with input from her primary care physician, until patient is able to get [place] some better history.  We will ask the case manager to contact mental health for some additional background information, and  get the R.N. to check and see what her current meds were according to what mental health had given her.  We have initiated Risperdal p.o. 1 mg t.i.d. and put her on Depakote 500  mg p.o. at h.s. and offered her Haldol 5 mg p.o. or IM q.6h. p.r.n. for agitation.  We will monitor her and see how well she responds.  ESTIMATED LENGTH OF STAY:  Four to six days. DD:  02/07/01 TD:  02/08/01 Job: [id number]

## 2010-11-10 NOTE — Discharge Summary (Signed)
NAMEMarland Thomas  ANAVICTORIA, Thomas NO.:  [id number]   MEDICAL RECORD NO.:  [id number]          PATIENT TYPE:  INP   LOCATION:  3028                         FACILITY:  [facility]   PHYSICIAN:  Deirdre Peer. Polite, M.D. DATE OF BIRTH:  Nov 17, 1960   DATE OF ADMISSION:  09/15/2005  DATE OF DISCHARGE:  09/18/2005                                 DISCHARGE SUMMARY   DISCHARGE DIAGNOSIS:  1.  Hyponatremia secondary to psychogenic polydipsia, sodium at the time of      this dictation within normal limits at 133, urine osmo greater than 400      post fluid restriction.  2.  Schizo-effective disorder.  3.  Reported history of seizure disorder.  4.  Chronic obstructive pulmonary disease.  5.  Morbid obesity.  6.  Diabetes.  7.  Hypertension.   DISCHARGE MEDICATIONS:  Diltiazem 180 mg daily, Protonix 40 mg daily,  Topamax 50 mg t.i.d., Seroquel 200 mg b.i.d., Seroquel 400 mg q.h.s.,  Depakote 2 grams q.h.s., sliding scale insulin, Haldol 25 mg q.4h. p.r.n.,  p.r.n. Albuterol.   DISPOSITION:  Currently awaiting transfer to [facility]   HISTORY OF PRESENT ILLNESS:  50 year old female with the above medical  problems evaluated in the ED for mental status change and increased  agitation.  In the ED, the patient was evaluated and found to have abnormal  labs consistent with hyponatremia.  Admission was deemed necessary for  further evaluation and treatment.  Please see dictated H&P for details.   PAST MEDICAL HISTORY:  As stated above.   MEDICATIONS:  As stated above and also on admission  H&P.   HOSPITAL COURSE:  The patient was admitted to a medicine floor bed for  evaluation and treatment of increased agitation and hyponatremia.  As for  the patient's hyponatremia, differential diagnoses included meds versus  SIADH versus polydipsia.  The patient had several labs ordered, initial  urine osmo was low at 104, however, post fluid restriction, the patient's  urine osmo was greater than 400.  The  patient's sodium rapidly increased to  a normal range with fluid restriction.  The patient was given some normal  saline and continues to remain within normal range, so normal saline has  been stopped.  The patient had other labs to rule out other causes of  hyponatremia which were essentially unremarkable.  Cortisol within normal  limits, TSH within normal limits.  Again, as stated, after the fluid  restriction, the patient's sodium improved.  The reason for hyponatremia is  psychogenic polydipsia.   Schizo-effective disorder.  The patient has been evaluated by psychiatry and  it is recommended that the patient be transferred to [facility] when medically  center.  The patient has several other chronic medical problems which are  stable, we will continue her current outpatient meds.  At this time, the  patient is felt to be medically stable for transfer to the medical health  center.      Deirdre Peer. Polite, M.D.  Electronically Signed     RDP/MEDQ  D:  09/17/2005  T:  09/18/2005  Job:  [id number]

## 2010-11-10 NOTE — Discharge Summary (Signed)
[facility]  Patient:    Natalie Thomas, Natalie Thomas                   MRN: [id number] Adm. Date:  [id number] Disc. Date: [id number] Attending:  Jasmine Pang CC:         [facility] PACT Team.   Discharge Summary  REASON FOR ADMISSION:  The patient was a 50 year old Caucasian female from [facility] referred by the [facility].  She has had a long history of psychosis and mood instability.  She became extremely depressed with multiple neurovegetative symptoms.  She also began hallucinating (seeing faces and people in black robes).  She had been unable to sleep for several nights prior to admission.  She has been followed regularly at the [facility] by the PACT Team.  She has a diagnosis of major depression and borderline personality disorder.  She has been hospitalized multiple times.  For further psychiatric admission information, see psychiatric admission assessment.  PHYSICAL EXAMINATION:  Calluses on the bottom of her right foot.  History of vulvar reconstruction and recent abnormal menses.  Breasts were quite large and dense.  GERD treated with Prilosec.  No other medical conditions noted.  ADMISSION LABORATORY DATA:  CBC with differential was within normal limits except for a slightly elevated hemoglobin of 16.3 (12 to 15) and a slightly elevated hematocrit 46.6 (36 to 46).  Repeat CBC done on September 03, 2000 revealed the hemoglobin had decreased somewhat to 15.9, hematocrit had increased to 47, WBC had increased from 9 which was within normal limits (4 to 10.5) to 15.6.  It was unclear why the increase in WBC count as the patient was not sick.  Routine chemistry panel: Within normal limits except for a slightly decreased sodium of 133 (135 to 145).  Hemoglobin A1c was 5.3 (4.6 to 6.5).  Lipid profile revealed elevated triglycerides 271 (0 to 200), HDL cholesterol was low at 30 (35 to 150).  T4  was within normal limits.  Urine pregnancy test was negative.  A.m. Depakote level was 31.8 upon admission.  On September 03, 2000, it was 41.0.  Both of these were done on Depakote 1000 mg p.o. q.h.s. (due to concern about compliance with Depakote prior to admission). Urine drug screen: Negative.  Urinalysis: Few bacteria, large hemoglobin initially but on repeat this was negative.  On repeat urinalysis there were no bacteria, either.  TSH was within normal limits.  HOSPITAL COURSE:  Upon admission the patient was continued on her home medications of Klonopin 0.5 mg p.o. t.i.d., Depakote 1000 mg q.h.s., Effexor 75 mg q.a.m., Cogentin 1 mg b.i.d., Prilosec 20 mg q.a.m., Haldol 5 mg q.h.s., multivitamin one q.a.m., Robitussin DM 10 cc q.6h. p.r.n. cough, Imodium two capsules at onset of diarrhea and one after each loose stool not to exceed six in a 24 hour period due to diarrhea.  On August 26, 2000, Effexor was increased to 150 mg q.a.m.  Also on this day, she was placed by our P.A., Mickie D. Adams, on Floxin Otic two drops to the right ear b.i.d.  She is scheduled to have ENT surgery on this ear in the future.  On August 27, 2000, she was given a one time dose of Haldol 5 mg, Klonopin 1 mg, and Cogentin 1 mg p.r.n. EPS due to agitation and severe anxiety.  On September 01, 2000, Cogentin order was changed to a standing dose of one p.o. b.i.d. with  a p.r.n. dose of 2 mg q.1h. not to exceed 6 mg in a 24 hour period.  She tolerated these medications well with no significant side effects except for sedation.  She was often sleepy and stayed in her room when groups were in progress.  It was difficult to encourage her to attend various unit therapeutic groups and activities.  She was anxious about the mental health centers PACT Team being angry at her for being readmitted to the hospital.  She was visited by them and felt reassured that they were supportive and were not angry at her.  She was scheduled to  see them on the day of discharge.  After the first several days of hospitalization, there was no further evidence of hallucinations or psychosis.  DISCHARGE MENTAL STATUS EXAMINATION:  Good eye contact.  Speech was still somewhat soft and slow but improved from admission.  Some psychomotor retardation but less so than admission.  Mood was less depressed and anxious. Affect: Wide range, able to smile appropriately.  No suicidal or homicidal ideation, no psychosis or perceptual disturbance.  Thought processes were logical and goal directed.  Cognitive exam was back to baseline.  Attention and concentration were still diminished.  Insight: Fair.  Judgment: Fair.  DISCHARGE DIAGNOSES: Axis I:    Bipolar disorder, depressed phase, severe with psychosis. Axis II:   Borderline personality disorder. Axis III:  1. Diabetes.            2. Gastroesophageal reflux disease.            3. Right ear abnormality followed by an ears, nose, and throat               physician.            4. Abnormal menses related to reconstruction of vulvar area. Axis IV:   Severe. Axis V:    Global assessment of functioning current is 50, highest past year            55, admission was 20.  DISCHARGE MEDICATIONS: 1. Klonopin 0.5 mg p.o. t.i.d. 2. Effexor XR 150 mg in the a.m. 3. Haldol 5 mg at bedtime. 4. Cogentin 1 mg b.i.d. 5. Prilosec 20 mg in the a.m. 6. Depakote 1000 mg at bed time (Depakote may possibly need to be increased    but due to her excessive sedation, I did not do this while she was in the    hospital).  ACTIVITY LEVEL:  No restrictions.  DIET:  No restrictions.  POST HOSPITAL CARE PLAN:  [facility] PACT Team. DD:  09/04/00 TD:  09/04/00 Job: [id number]

## 2010-11-10 NOTE — H&P (Signed)
NAME:  Natalie Thomas, Natalie Thomas NO.:  [id number]   MEDICAL RECORD NO.:  [id number]          PATIENT TYPE:  IPS   LOCATION:  0404                          [facility]ACILITY[facility]  [facility]   PHYSICIAN:  Geoffery Lyons, M.D.      DATE OF BIRTH:  1960-08-27   DATE OF ADMISSION:  09/18/2005  DATE OF DISCHARGE:  09/25/2005                         PSYCHIATRIC ADMISSION ASSESSMENT   IDENTIFYING INFORMATION:  The patient is a 50 year old divorced white female  voluntarily admitted on September 18, 2005.   HISTORY OF PRESENT ILLNESS:  The patient is a transfer from [facility] after patient was admitted for confusion, agitation, and  hyponatremia.  The patient received IV fluids and stabilization of her  electrolytes.  The patient was having paranoid ideation, convinced that she  was being held in prison.  The patient was having visual hallucinations,  seeing dogs.  There has been no apparent substance abuse.  The patient does  have a history of schizoaffective disorder.   PAST PSYCHIATRIC HISTORY:  The patient's last visit was in February of 2007  to [facility] for agitation, suicidal thoughts and delusional  thinking.   SOCIAL HISTORY:  This is a 50 year old divorced white female.  Has no  children.  She is a resident at [facility].   FAMILY HISTORY:  Not obtainable.   ALCOHOL/DRUG HISTORY:  The patient smokes.  Denies any alcohol or drug use.   PRIMARY CARE PHYSICIAN:  Unclear.   MEDICAL PROBLEMS:  Chronic obstructive pulmonary disease, hyponatremia,  morbid obesity and epilepsy.   MEDICATIONS:  Cardizem 180 mg daily, Protonix 40 mg daily, Topamax 50 mg  t.i.d., Seroquel 200 mg b.i.d., 400 mg at bedtime, K-Dur 20 mEq daily,  Trilafon 8 mg b.i.d. and 16 mg at bedtime, Klonopin 0.5 mg t.i.d. p.r.n.,  albuterol MDI 3 puffs q.i.d.   ALLERGIES:  PENICILLIN, BIAXIN.   PHYSICAL EXAMINATION:  The patient was fully assessed at [facility].  Temperature 97.8, heart rate  94, respirations 20, blood pressure  152/64.  In general, this is an overweight, middle-aged female in no acute  distress.  Currently resting in bed.   REVIEW OF SYSTEMS:  Noncontributory except for history of seizures.   LABORATORY DATA:  TSH is 1.031.  Acetaminophen level less than 10.  Alcohol  level less than 5.  Urinalysis was negative.  Valproic acid level was 71.4.  Drug screen is negative.  Cortisol level is 15.2.  Sodium, on arrival to the  hospital, is 116 with last sodium of 132, prior to transfer.   MENTAL STATUS EXAM:  The patient is in the bed.  Her eyes are closed.  She  is somewhat irritable.  Responding only saying a few words.  The patient,  again, seems somewhat irritable.  Thought processes are difficult to  ascertain due to patient being a poor historian.   DIAGNOSES:  AXIS I:  Schizoaffective disorder.  AXIS II:  Deferred.  AXIS III:  Hyponatremia, chronic obstructive pulmonary disease, morbid  obesity.  AXIS IV:  Medical problems, other psychosocial problems related to burden of  illness.  AXIS V:  Current 30.   PLAN:  To contract for safety.  Stabilize mood and thinking.  Will resume  her medications.  Will follow her labs.  [place]emanager is to clarify the  patient returning to [facility].   TENTATIVE LENGTH OF STAY:  Four to six days.      Landry Corporal, N.P.      Geoffery Lyons, M.D.  Electronically Signed    JO/MEDQ  D:  09/25/2005  T:  09/27/2005  Job:  [id number]

## 2010-11-10 NOTE — H&P (Signed)
NAME:  Natalie Thomas, Natalie Thomas NO.:  [id number]   MEDICAL RECORD NO.:  [id number]                   PATIENT TYPE:  INP   LOCATION:  5739                                 FACILITY:  [facility]   PHYSICIAN:  Lazaro Arms, M.D.        DATE OF BIRTH:  1961/05/31   DATE OF ADMISSION:  12/28/2002  DATE OF DISCHARGE:                                HISTORY & PHYSICAL   PRIMARY CARE PHYSICIAN:  Dr. Everardo All from [facility].   Note:  I was asked to admit this patient as unassigned although after  interview the patient stated that her primary care physician was Dr.  Everardo All.   CHIEF COMPLAINT:  Natalie Thomas was brought via EMS from her rest home  secondary to lethargy and altered mental status which had begun on the day  of admission. History is hard to obtain as the patient is currently  lethargic.   HISTORY OF PRESENT ILLNESS:  This is obtained from old records and notes  sent over from the rest home. Apparently, she had been in her usual state of  health until today when she was found to have decreased mental status and  staggering which is typical for presentation when her sodium is low, as Ms.  Thomas has had multiple admissions for this over the past several years. Had  had multiple extensive workups for this, most recently in May of 2004, with  the diagnosis being that it is secondary to polydipsia and excessive amounts  of ice water and Diet Coke intake. The patient was brought via EMS. Upon my  interview, she was arousable to tactile stimuli; when asked what is wrong,  she was able to state that her sodium is low and that she drinks lots of ice  water. She denied any fever, chills, shortness of breath, or chest pain.   PAST MEDICAL HISTORY:  1. 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  2. Schizoaffective disorder with mood instability.  3. Diabetes.  4. GERD.  5. Atypical chest pain.   MEDICATIONS  AT THE REST HOME:  1. Cogentin 1 mg p.o. q.d.  2. Klonopin 0.5 mg p.o. q.i.d.  3. Haldol 5 mg p.o. q.a.m. and 10 mg p.o. q.h.s.  4. __________ 25 mg p.o. q.d.  5. Depakote 1,000 mg p.o. b.i.d.  6. Protonix 40 mg p.o. q.d.  7. Avandia 2 mg p.o. q.d.  8. Prozac 20 mg p.o. q.d.   Of note, discharge medications were somewhat different. At that point, she  was on Effexor and Lexapro. These appear to have been discontinued and  Prozac has been substituted.   She is allergic to penicillin.   SOCIAL HISTORY:  She is divorced. She does smoke. She does not drink  alcohol. She is a Engineer, agricultural. She currently lives at the rest  home.   FAMILY HISTORY:  Colon cancer in her paternal grandfather and  her mother had  coronary artery disease and COPD.   The rest of her review of systems was unable to be obtained.   PHYSICAL EXAMINATION:  VITAL SIGNS:  Initial vital signs, pulse 86, blood  pressure was 136/72. She was 98% on room air.  GENERAL:  She was lethargic, sleepy, but arousable. Able to answer questions  and follow simple commands. Pupils were 6 mm, equal, and sluggish to react.  There was no icterus. There was no JVD.  CARDIAC EXAM:  Showed S1 and S2. Regular rhythm. There is no murmur  appreciated.  LUNGS:  Clear.  ABDOMEN:  Soft, obese, was not tender. She did have some candidal dermatitis  that was in her inguinal floor under the breast.  NEUROLOGIC:  Exam was hard. There were no localizing signs, however, as she  was only following simple command.  EXTREMITIES:  She just had trace edema and her pulses were intact.   When I saw her in the emergency room, the only lab work that had been  obtained was an i-STAT which was significant for a sodium of 109 and  potassium of 2.9, chloride of 74, BUN 3, and a creatinine of 0.7. A urine  pregnancy was negative. A urine tox was negative.   IMPRESSION:  1. Hyponatremia. 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 I will, however, check a urine OSM     and serum OSM but will start treatment with normal saline at 50 cc an     hour with 28 K/L as well as free water restriction, paying close     attention to the rise in rate of sodium.  2. Decreased mental status. This is almost certainly associated with a     decreased sodium. I will check a Depakote level to make sure that she is     not toxic although I suspect her mental status will improve as the sodium     does as it has on the other previous admissions.  3. Diabetes. The patient stable at this time.  4. Schizoaffective disorder. There were no reports of being unstable prior     to this presentation.   PLAN:  1. Normal saline in addition to free water restriction.  2. Continue usual psych medicines and diabetes medicines.  3. Monitor mental status. If no improvement, consider checking a head CT.                                               Lazaro Arms, M.D.    AMC/MEDQ  D:  12/28/2002  T:  12/29/2002  Job:  [id number]

## 2010-11-10 NOTE — Discharge Summary (Signed)
NAME:  LETRICIA, KRINSKY NO.:  [id number]   MEDICAL RECORD NO.:  [id number]          PATIENT TYPE:  IPS   LOCATION:  0404                          [facility]ACILITY[facility]  [facility]   PHYSICIAN:  Geoffery Lyons, M.D.      DATE OF BIRTH:  1961-05-17   DATE OF ADMISSION:  09/18/2005  DATE OF DISCHARGE:  09/25/2005                                 DISCHARGE SUMMARY   CHIEF COMPLAINT AND PRESENT ILLNESS:  This was one of several admissions to  [facility] for this 50 year old divorced white female  voluntarily admitted.  She was transferred to [facility] after she  was admitted for confusion, agitation and hyponatremia.  Received IV fluids  and her electrolytes were stabilized.  The patient was having paranoid  ideation.  Convinced that she was being held in prison.  Was having visual  hallucinations, seeing dogs.  She has no apparent substance abuse.  History  of schizoaffective disorder.   PAST PSYCHIATRIC HISTORY:  Last time admitted in February of 2007 at  [facility] due to agitation and suicidal thoughts and delusional  thinking.   ALCOHOL/DRUG HISTORY:  Denies any active use of any substances.   MEDICAL HISTORY:  Chronic obstructive pulmonary disease, hyponatremia,  epilepsy. . .   Dictation ended at this point.      Geoffery Lyons, M.D.  Electronically Signed     IL/MEDQ  D:  10/08/2005  T:  10/10/2005  Job:  [id number]

## 2010-11-10 NOTE — Discharge Summary (Signed)
   NAME:  Natalie Thomas, Natalie Thomas                      ACCOUNT NO.:  [id number]   MEDICAL RECORD NO.:  [id number]                   PATIENT TYPE:  INP   LOCATION:  5712                                 FACILITY:  [facility]   PHYSICIAN:  Rene Paci, M.D. [facility]          DATE OF BIRTH:  1960/11/25   DATE OF ADMISSION:  04/15/2003  DATE OF DISCHARGE:  04/19/2003                                 DISCHARGE SUMMARY   DISCHARGE DIAGNOSES:  1. Acute exacerbation of chronic obstructive pulmonary disease.  2. Mild hyponatremia.   HISTORY OF PRESENT ILLNESS:  Natalie Thomas is a 50 year old white female who  presented with moderate shortness of breath associated with dry cough.   PAST MEDICAL HISTORY:  1. COPD.  2. Tobacco abuse.  3. Schizoaffective disorder.  4. 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  5. Epilepsy.  6. Gastroesophageal reflux disease.   HOSPITAL COURSE:  PROBLEM #1 -  PULMONARY:  The patient presented with an  acute exacerbation of COPD.  The patient was started on IV Solu-Medrol,  oxygen, nebulizer treatments.  She was quickly tapered to oral steroids.  She was weaned off oxygen.  She was empirically started on antibiotics.  She  is currently tolerating oral medications and maintaining saturations greater  than 95% on room air.   PROBLEM #2 -  MILD CHRONIC HYPONATREMIA WITH A HISTORY OF PSYCHOGENIC  POLYDIPSIA EXACERBATED BY PSYCHIATRIC MEDICATIONS:  This remained stable.   DISCHARGE LABORATORY DATA:  Sodium 133, BUN 9, creatinine 0.5.   DISCHARGE MEDICATIONS:  She is to resume her previous medications which are  as follows:  1. Prozac 20 mg daily.  2. Declomycin 300 mg daily.  3. Depakote ER 500 mg two tablets b.i.d.  4. Potassium chloride 20 mEq b.i.d.  5. Protonix 40 mg b.i.d.  6. Seroquel 100 mg at 5 p.m. and 8 p.m.  7. Clonazepam 0.5 mg t.i.d.  8. Augmentin 875 mg b.i.d. for five additional days.  9.     Prednisone  20 mg two tablets for three days, then one tablet for three days,     then stop.  10.      Albuterol inhaled two puffs four times a day for five days then     every four to six hours as needed.   FOLLOW UP:  The patient will follow up with Dr. Everardo All as needed.      Cornell Barman, P.A. [facility]                  Rene Paci, M.D. [facility]    LC/MEDQ  D:  04/19/2003  T:  04/19/2003  Job:  [id number]   cc:   Gregary Signs A. Everardo All, M.D. [facility]

## 2010-11-10 NOTE — Discharge Summary (Signed)
NAME:  Natalie, Thomas NO.:  [id number]   MEDICAL RECORD NO.:  [id number]          PATIENT TYPE:  INP   LOCATION:  3028                         FACILITY:  [facility]   PHYSICIAN:  Sherin Quarry, MD      DATE OF BIRTH:  11-09-1960   DATE OF ADMISSION:  09/15/2005  DATE OF DISCHARGE:  09/18/2005                                 DISCHARGE SUMMARY   Natalie Thomas is a 50 year old lady who is a resident of [facility].  Natalie Thomas has a longstanding history of schizoaffective disorder and has  been admitted to [facility] on multiple occasions with confusion  and agitation.  The patient was transported to [facility] Emergency Room on  March 24 with complaints of increased confusion and agitation.  On initial  evaluation for medical clearance, her sodium level was found to be 116.  Review of the chart suggests that the patient has a history of psychogenic  polydipsia and hyponatremia.  She was therefore admitted to the medical  service for further treatment of this problem.   PHYSICAL EXAMINATION ON ADMISSION:  GENERAL:  The patient was very agitated  and was yelling and screaming.  She indicated that she felt she was being  held in a prison.  She refused to allow the nurses to examine her and  initially had refused to allow an IV to be placed, although she eventually  agreed.  HEENT:  Within normal limits.  CHEST:  Clear.  CARDIOVASCULAR:  Revealed normal S1 and S2.  Heart without murmurs, rubs, or  gallops.  ABDOMEN:  Benign, although the patient strongly resisted examination.  There  is no guarding or rebound.  NEUROLOGIC:  The patient was noted to be moving all extremities.  She  responded to painful stimuli.  Station, gait, and cerebellar exam could not  be performed.  She was oriented only to name.  She believed that she was in  a prison and did not know what date it was.  EXTREMITIES:  No clubbing, cyanosis or edema.   LABORATORY STUDIES:  Included  CBC which revealed a white count of 11,300,  hemoglobin 12.9.  CMET showed a sodium of 116, potassium of 3.3, creatinine  of 0.5, BUN was 7.  Alcohol level was negative.  Drug screen was negative.  Serum cortisol was within normal limits.  TSH was normal.   HOSPITAL COURSE:  On admission, the patient was placed on IV normal saline  at 150 mL per hour with 10 mEq KCl.  On March 25, the sodium was up to 130.  At that time the IV was saline locked and the patient was placed on fluid  restriction such that she was not to take more than 1 L of water per day.  Her Lasix medication was also withheld.  On March 27, arrangements were made  for transfer of the patient to behavioral health.   DIAGNOSES AT THE TIME OF TRANSFER:  1.  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  2.  Schizoaffective disorder.  3.  Possible history of diabetes.  4.  Chronic obstructive pulmonary disease.  5.  Morbid obesity.  At the time of discharge, the sodium was 131, potassium was 4.3.   DISCHARGE MEDICATIONS:  1.  Cardizem 180 mg daily.  2.  Protonix 40 mg daily.  3.  Topamax 50 mg t.i.d.  4.  Seroquel 200 mg b.i.d. and 400 mg at bedtime.  5.  K-Dur, the dose of which can be reduced to 20 mEq daily.  6.  Trilafon 8 mg b.i.d. and 16 mg at bedtime.  7.  Depakote 2 g at bedtime daily.  8.  Klonopin 0.5 mg t.i.d. p.r.n. for anxiety.  9.  Haldol 2 to 5 mg IV every four to six hours p.r.n. for agitation.  10. Albuterol metered-dose inhaler three puffs q.i.d. p.r.n. for wheezing.   CONDITION ON DISCHARGE:  Fair.           ______________________________  Sherin Quarry, MD     SY/MEDQ  D:  09/18/2005  T:  09/18/2005  Job:  [id number]   cc:   BE[facility]AL HEALTH   [facility]

## 2010-11-10 NOTE — H&P (Signed)
[facility]  Patient:    Natalie Thomas, Natalie Thomas                   MRN: [id number] Adm. Date:  [id number] Attending:  Jasmine Pang CC:         [facility].   Psychiatric Admission Assessment  DATE OF ADMISSION:  August 25, 2000  PATIENT IDENTIFICATION:  This 50 year old Caucasian female from [place] was referred by [facility].  HISTORY OF PRESENT ILLNESS:  The patient has a long history of psychosis and mood instability.  She states she became extremely depressed with multiple neurovegetative symptoms.  These included anergia, anhedonia, difficulty concentrating, feelings of hopelessness and worthlessness.  In addition, she states she was hallucinating (seeing faces, seeing people in black robes, and seeing a black Jesus).  She has been unable to sleep for at least the past several nights.  She has a diagnosis of major depression and borderline personality disorder and is well known to the [facility].  She has also been hospitalized multiple times and is well known to staff here.  PAST PSYCHIATRIC HISTORY:  Currently seen at the [facility].  She is followed by the PACT Team.  The rest of past psychiatric history is as per HPI.  SUBSTANCE ABUSE HISTORY:  The patient denies.  PAST MEDICAL HISTORY:  The patient sees Dr. Lanna Poche at [facility] for primary care.  Medical problems include diabetes and GERD.  Medications include, Prilosec 20 mg q.a.m., Glucophage 250 mg q.d., Provera 10 mg q.d., Depakote 1000 mg q.h.s., Haldol 5 mg q.h.s., Effexor 75 mg q.a.m., Klonopin 0.5 mg t.i.d.  Drug allergies: PENICILLIN.  SOCIAL HISTORY:  Family psychiatric history: Paternal aunt committed suicide. There is alcoholism on both paternal and maternal sides of the family.  Social history: The patient lives by herself.  She is disabled due to her psychiatric disorder.  She  denies any legal problems.  She states she gets a lot of emotional support from the [facility].  ADMISSION MENTAL STATUS EXAMINATION:  Anxious, tearful Caucasian female with poor eye contact.  Disheveled and unkempt.  Speech was soft and slow. Psychomotor retardation.  Mood was depressed.  Affect: Sad and tearful. Positive suicidal ideation but she denied intent.  Positive psychosis as per history of present illness.  Thought processes were logical and goal directed. Thought content revealed no predominant theme.  Cognitive: Alert and oriented x 4.  Short-term and long-term memory appeared diminished.  General fund of knowledge: Age and education level appropriate.  Attention and concentration were diminished.  Insight: Poor.  Judgment: Poor.  ADMISSION DIAGNOSES: Axis I:    Bipolar disorder, depressed phase, severe with psychosis. Axis II:   Borderline personality disorder. Axis III:  1. Diabetes.            2. Gastroesophageal reflux disease. Axis IV:   Severe. Axis V:    Current 20, highest past year 32.  ASSETS AND STRENGTHS:  The patient is engaging.  Problems: Mood instability with current psychosis and suicidal ideation.  INITIAL PLAN OF CARE:  Short-term treatment goal: Resolution of suicidal ideation and psychosis.  Long-term treatment goal: Resolution of mood instability, stabilization of borderline personality disorder to an optimum functioning.  Plan: Increase Effexor to 150 mg p.o. q.a.m.  Continue other medications.  Will be involved in unit therapeutic groups and activities to decrease suicidal ideation, improve coping strategies, improve  reality testing.  ESTIMATED LENGTH OF STAY:  Three to five days.  CONDITIONS NECESSARY FOR DISCHARGE:  Not suicidal, not psychotic.  POST HOSPITAL CARE PLAN:  Return home to her current living situation. Follow-up therapy and medication management will continue at the [facility] DD:  08/27/00 TD:  08/28/00 Job: [id number]

## 2010-11-10 NOTE — Discharge Summary (Signed)
NAME:  Natalie Thomas, Natalie Thomas NO.:  [id number]   MEDICAL RECORD NO.:  [id number]          PATIENT TYPE:  IPS   LOCATION:  0404                          [facility]ACILITY[facility]  [facility]   PHYSICIAN:  Geoffery Lyons, M.D.      DATE OF BIRTH:  1961-02-24   DATE OF ADMISSION:  09/18/2005  DATE OF DISCHARGE:  09/25/2005                                 DISCHARGE SUMMARY   CHIEF COMPLAINT AND PRESENT ILLNESS:  This was one of several admissions to  [facility] for this 50 year old divorced white female,  voluntarily admitted. Transferred from [facility] after being  admitted for confusion, agitation and hyponatremia. She received IV fluids  and stabilized, and her electrolytes were stabilized. Was having paranoid  ideas, convinced that she was being held in prison. Was having visual  hallucinations, seeing dogs. There has been no apparent substance abuse. Has  a long history of schizoaffective disorder.   PAST PSYCHIATRIC HISTORY:  Last hospitalization February 2007, suicidal  thoughts and delusional thinking.   ALCOHOL AND DRUG HISTORY:  There is no evidence of alcohol or any other  substance use.   PAST MEDICAL HISTORY:  1.  COPD.  2.  Hyponatremia.  3.  Moderate obesity.  4.  Epilepsy.   MEDICATIONS:  1.  Cardizem 180 mg per day.  2.  Protonix 40 mg per day.  3.  Topamax 50 three times a day.  4.  Seroquel 200 twice a day, 400 at bedtime.  5.  K-Dur 20 mEq daily.  6.  Trilafon 8 mg twice a day, 16 at night.  7.  Klonopin 0.5 three times a day.  8.  Albuterol inhaler 4 times a day.   PHYSICAL EXAMINATION:  Physical exam performed but did not show any acute  findings.   LABORATORY DATA:  TSH 1.031. Depakote level 71.4. Drug screen negative for  substances of abuse. Sodium on arrival in the hospital 116. Last sodium upon  transfer 132.   MENTAL STATUS EXAMINATION:  Reveals a patient in bed, eyes closed. Somewhat  irritable. Responding only to say a few  works. Seems irritable.  Noncooperative. Thought process difficult to ascertain due to her lack of  engagement in the conversation.   ADMISSION DIAGNOSES:  AXIS I:  Schizoaffective disorder.  AXIS II:  No diagnosis.  AXIS III:  History of hyponatremia, chronic obstructive pulmonary disease.  AXIS IV:  Moderate.  AXIS V:  Global Assessment of Functioning upon admission 30, highest Global  Assessment of Functioning in the last year 55/60.   COURSE IN THE HOSPITAL:  She was admitted. She was started in individual and  group psychotherapy. She was maintained on her medications, Cardizem 180 mg  per day, Depakote 2 g daily, Protonix 40 mg per day, Allegra 180 mg per day,  Colace 100 daily, Topamax 50 mg 3 times per day, Seroquel 200 twice a day  and 400 at bedtime, Trilafon 4 mg 2 twice a day and 4 at night, Glucotrol XL  10 mg per day, albuterol 2 four times a day, Lasix 60 mg twice a day, K-Dur  20 mEq daily, lisinopril 10 mg in the morning, Risperdal 2 mg twice a day  and Cogentin. Tried to simplify her medications. Risperdal was discontinued,  so we worked with the Seroquel and the Trilafon. She endorsed that she was  feeling that people were following her. Cannot explain why. Does not feel  that she can be safe anywhere. In bed, isolating. Will only stay with lights  on, would not permit it being with lights out. Persistent paranoia and  suspiciousness. As the hospitalization progressed, she felt more comfortable  getting out of bed. She was somewhat irritable and hostile towards certain  staff. We continued to simplify her medications. On April 1, she continued  to endorse that she was feeling that she was in a prison and she wanted to  go home. Endorsed that she has not done anything wrong __________. By April  2, endorsed that she was feeling better, and on April 3, she was objectively  better. She said she was ready to go home. Denied any active suicidal or  homicidal ideation. She  endorsed that she was not feeling paranoid any more,  felt that the medication was agreeing with her. She was willing to return  back to [facility]. Sodium on discharge 136.   DISCHARGE DIAGNOSES:  AXIS I:  Schizoaffective disorder.  AXIS II:  No diagnosis.  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  AXIS IV:  Moderate.  AXIS V:  Global Assessment of Functioning upon discharge 50.   DISCHARGE MEDICATIONS:  1.  Cardizem 180 mg per day.  2.  Depakote ER 500 four at bedtime.  3.  Protonix 40 mg per day.  4.  Colace 100 mg daily.  5.  Topamax 25 two three times a day.  6.  Seroquel 200 two at night.  7.  Trilafon 8 mg 1 twice a day, 2 at night.  8.  Os-Cal 1 three times a day.  9.  Albuterol 2 puffs 3 times a day.   FOLLOW UP:  With the [facility].      Geoffery Lyons, M.D.  Electronically Signed     IL/MEDQ  D:  10/08/2005  T:  10/10/2005  Job:  [id number]

## 2010-11-10 NOTE — H&P (Signed)
[facility]  Patient:    Natalie Thomas, Natalie Thomas                     MRN: [id number] Adm. Date:  12/20/99 Attending:  Jonny Ruiz T. Kyla Balzarine, M.D.                         History and Physical  CHIEF COMPLAINT:  This 50 year old woman returns for a followup after undergoing cervical conization on Nov 15, 1999.  She had minimal bleeding following conization and has no pelvic symptoms.  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  She has longstanding oligomenorrhea.  On conization on Nov 15, 1999, she had variable squamous dysplasia, focally severe, with free margins.  PAST MEDICAL HISTORY: 1. Significant for extensive psychiatric diagnoses with biphasic affect    disorder requiring multiple hospitalizations. 2. She additionally has vulvar and axillary hydradenitis.  CURRENT MEDICATIONS: 1. Depakote. 2. Effexor. 3. Haldol. 4. Klonopin. 5. Cogentin. 6. Prilosec. 7. Vitamins.  ALLERGIES:  No known drug allergies.  PHYSICAL EXAMINATION:  VITAL SIGNS:  Stable and afebrile.  ABDOMEN:  Soft, benign.  GENITOURINARY/PELVIC/RECTAL:  External genitalia and BUS normal.  Speculum examination reveals good supportive bladder and urethra, good clear mucosa and healed cone site of the cervix.  Bimanual and rectovaginal examinations reveal small soft cervix.  The pathology from conization is reviewed with the findings above.  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  PLAN:  The patient is reassured that no invasive cancer was seen.  I recommended that we follow her Pap smears at six-month intervals. DD:  12/20/99 TD:  12/20/99 Job: [id number]

## 2010-11-10 NOTE — Discharge Summary (Signed)
NAME:  Natalie Thomas, Natalie Thomas NO.:  [id number]   MEDICAL RECORD NO.:  [id number]                   PATIENT TYPE:  IPS   LOCATION:  0406                                 [facility]ACILITY:  [facility]   PHYSICIAN:  Geoffery Lyons, M.D.                   DATE OF BIRTH:  [birth year]   DATE OF ADMISSION:  12/17/2003  DATE OF DISCHARGE:  12/22/2003                                 DISCHARGE SUMMARY   CHIEF COMPLAINT AND PRESENT ILLNESS:  This was one of multiple admissions to  [facility] for this 50 year old white female, single,  involuntarily committed.  Initially very sedated but there was information  that she had been increasing more disruptive with meals as well as verbally.  Aggressive towards other residents.  Threatened to stab one with a fork.  Evidenced some delusional ideas.  Upon admission, she was calling staff  names, chanting, refusing to cooperate.  She refused mental health  appointment on the day before admission.  She was agitated and threatening.  She was endorsing having a baby with another resident.   PAST PSYCHIATRIC HISTORY:  This is the third or the fourth at [facility].  Multiple other admissions.  Diagnosed schizoaffective disorder.   ALCOHOL/DRUG HISTORY:  Denies the use or abuse of any substances.   MEDICAL HISTORY:  COPD, epilepsy, diabetes mellitus, type 2.   MEDICATIONS:  Depakote 1000 mg twice a day, Prozac 60 mg daily, Seroquel 100  mg at night and Klonopin 0.5 mg twice a day.   PHYSICAL EXAMINATION:  Performed and failed to show any acute findings.   LABORATORY DATA:  CBC with hemoglobin 16.0, hematocrit 35.9.  Blood  chemistry within normal limits.  Glucose, on June 24th, 197; on June 29th  82.  Hemoglobin A1C 5.3.  Lipid profile within normal limits.   MENTAL STATUS EXAM:  Female in bed.  Initially sedated but readily opens her  eyes, easily arousable.  She had Zyprexa Zydis earlier due to agitation.  Upon admission,  she was agitated, threatening, refusing to cooperate.  Evidencing some paranoid delusional ideation.   ADMISSION DIAGNOSES:   AXIS I:  Schizoaffective disorder.   AXIS II:  No diagnosis.   AXIS III:  1. Diabetes mellitus, type 2.  2. Chronic obstructive pulmonary disease.  3. History of epilepsy.  4. Hyponatremia.   AXIS IV:  Moderate.   AXIS V:  Global Assessment of Functioning upon admission 25; highest Global  Assessment of Functioning in the last year 55.   HOSPITAL COURSE:  She was admitted and started in individual and group  psychotherapy.  She was given the albuterol nebulizer, maintained on  Protonix 40 mg per day, Prozac 60 mg per day, Seroquel 100 mg twice a day,  Zyprexa Zydis 5 mg every four hours as needed for agitation, Ativan 1 mg  every four hours as needed for agitation.  Protonix was increased  to twice a  day.  She was placed on Depakote 500 mg, 2 tabs twice a day, as this was  part of her regime, Cardizem CD 120 mg per day, Fosamax 70 mg every week,  Percocet 5 mg, 1 every 4-6 hour p.r.n. for pain, Declomycin 300 mg daily.  Continued to evidence delusional ideas regarding a baby.  She was evidencing  some confusion.  We continued the Seroquel, the Depakote and the Klonopin.  Seroquel was increased to 100 mg in the morning, 100 mg in the afternoon and  200 mg at night.  By December 20, 2003, she endorsed she was feeling better.  Could not say what had happened to her.  Did endorse that she was overall  better.  She was compliant with medication.  As we increased the Seroquel,  her sleep became more stable.  She was not wanting any medication changes  because felt they were working for her.  A member of the group home came by  and felt that she was back to baseline.  On December 22, 2003, she was in full  contact with reality.  She was voicing no suicidal or homicidal ideation.  There was no evidence of delusions or hallucinations.  She was wanting to be  discharge so  we went ahead and discharged to outpatient treatment.   DISCHARGE DIAGNOSES:   AXIS I:  Schizoaffective disorder.   AXIS II:  No diagnosis.   AXIS III:  1. Diabetes mellitus, type 2.  2. Chronic obstructive pulmonary disease.  3. Hyponatremia.  4. History of epilepsy.   AXIS IV:  Moderate.   AXIS V:  Global Assessment of Functioning upon discharge 50-55.   DISCHARGE MEDICATIONS:  1. Klonopin 0.5 mg twice a day.  2. Protonix 40 mg twice a day.  3. Depakote 500 mg twice a day.  4. Cardizem CD 120 mg daily.  5. Declomycin 150 mg, 2 in the morning.  6. Prozac 20 mg, 3 daily.  7. Fosamax 70 mg every seven days.  8. Lotrimin cream.  9. Seroquel 100 mg twice a day and 200 mg at night.  10.      Percocet every six hours as needed for pain.   FOLLOW UP:  [facility], Dr. Lang Snow.                                               Geoffery Lyons, M.D.    IL/MEDQ  D:  01/18/2004  T:  01/18/2004  Job:  [id number]

## 2010-11-10 NOTE — Consult Note (Signed)
NAME:  Natalie Thomas, WENK NO.:  [id number]   MEDICAL RECORD NO.:  [id number]                   PATIENT TYPE:  OUT   LOCATION:  GYN                                  FACILITY:  [facility]   PHYSICIAN:  De Blanch, M.D.         DATE OF BIRTH:  [birth year]   DATE OF CONSULTATION:  09/07/2003  DATE OF DISCHARGE:                                   CONSULTATION   REASON FOR CONSULTATION:  A 50 year old white female returns for continuing  followup having had a past history of genital dysplasia.   INTERVAL HISTORY:  The patient was last seen in March of 2003.  She has  failed to keep numerous appointments that she has been given.  She presents  today complaining of vulvar itching and a vaginal discharge.   She denies any vaginal bleeding, any pelvic pain, and no new lesions on the  vulva.  The patient does note that she has not had a menstrual period in a  year and one-half and had not used any Depo-Provera during that time.   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  She has been followed since that time with no  evidence of recurrent disease.   In May of 2001, the patient underwent a cold knife conization for severe  squamous dysplasia.  She had negative surgical margins and follow up Pap  smears, which have been intermittent, have all been normal.   PAST MEDICAL HISTORY:  The patient has a chronic psychiatric problem, adult  onset diabetes, obesity.   PAST SURGICAL HISTORY:  As above.   CURRENT MEDICATIONS:  Clonazepam, ibuprofen, albuterol, Clotrimin, Benadryl,  Declomycin, diltiazem, fluoxetine, Depakote, potassium, Protonix, Seroquel.   DRUG ALLERGIES:  PENICILLIN.   REVIEW OF SYSTEMS:  Otherwise negative.   SOCIAL HISTORY:  The patient's parents have both died and she lives at  [facility].   PHYSICAL  EXAMINATION:  VITAL SIGNS:  Weight 234 pounds, blood pressure  130/60.  GENERAL:  The patient is a moderately obese white female in no acute  distress.  HEENT:  Negative.  NECK:  Supple without thyromegaly.  ABDOMEN:  Obese, soft, nontender.  No masses, organomegaly, ascites, or  hernias are noted.  PELVIC:  EGBUS shows that the skin grafted area of the vulva and perianal  area is excellent and no lesions are noted.  She does have some excoriation  on the lateral aspects of the vulva where she has been scratching.  No  lesions are noted.  Vagina is clean, well supported.  The cervix is flush  with the vaginal vault.  The cervical os is visualized.  Pap smears are  obtained.  Bimanual and rectovaginal exams reveal no masses or nodularity.   IMPRESSION:  1. Past history of carcinoma in situ of the vulva and cervix status post  skinning vulvectomy with split-thickness skin graft and status post cold     knife conization.  Pap smears are repeated.  2. Vulvitis.  The patient is instructed to keep the skin of the vulva clean     and dry and is given a prescription for Temovate 0.5% to be applied twice     a day if she needs to if needed for itching.  3. Amenorrhea of questionable etiology.  An follicle-stimulating hormone     will be obtained today.  If the follicle-stimulating hormone is not in     the menopausal range, we will obtain an ultrasound to see whether she has     fluid in her uterus which might suggest the possibility that she has     cervical stenosis following a cold knife conization.  She will return to     see [place] for routine evaluation in one year.                                               De Blanch, M.D.    DC/MEDQ  D:  09/07/2003  T:  09/08/2003  Job:  [id number]   cc:   Telford Nab, R.N.  [address]  [place], [place] [address]

## 2010-11-10 NOTE — Consult Note (Signed)
[facility]  Patient:    Natalie Thomas, Natalie Thomas                   MRN: [id number] Proc. Date: 06/11/00 Adm. Date:  [id number] Attending:  Jeannette Corpus CC:         Malachi Pro. Ambrose Mantle, M.D.  Telford Nab, R.N.  Titus Dubin. Alwyn Ren, M.D. [facility], M.D.  Alfredia Ferguson, M.D.   Consultation Report  HISTORY:  [age]-year-old white female returns for continuing followup of vulvar carcinoma in situ and cervical carcinoma in situ as well as menorrhagia.  Patient had a skinning vulvectomy and split-thickness skin graft in 1998.  She has been followed since that time with no evidence of disease, although she complains today of a lump around her anus.  She had a conization in May of 2001 for severe squamous dysplasia with negative margins.  She has also had menorrhagia and has been on cyclic Provera 10 mg a day for 10 days every other month.  Patient reports aside from the lump on her anus, she has had no vulvar or vaginal symptoms.  She does note that her menstrual period are heavy and last a week to two weeks.  CURRENT MEDICATIONS:  Increasing doses of Depakote, Effexor, Haldol, Klonopin, Cogentin, Prilosec and vitamins.  DRUG ALLERGIES:  None.  PHYSICAL EXAMINATION  GENERAL:  Physical exam reveals an obese white female in no acute distress.  HEENT:  Negative.  NECK:  Supple without thyromegaly.  NODES:  There is no supraclavicular or inguinal adenopathy.  ABDOMEN:  Soft and nontender.  No mass, organomegaly, ascites or herniae are noted.  PELVIC:  EG/BUS show the skin grafted vulva and perianal area to be fine and no lesions are noted.  There is some thickening in the perianal region.  The vagina has menstrual blood in it, no lesions are noted and the cervix is post conization and is flush with the vaginal vault.  Uterus is anterior, normal shape, size and consistency.  There are no adnexal masses noted.  Pap  smears are obtained.  DESCRIPTION OF PROCEDURE:  After infiltrating with 1% Xylocaine, a punch biopsy was obtained from the perianal lesion.  Hemostasis was achieved with silver nitrate.  IMPRESSION 1. Perianal lump, probable scar, biopsy pending. 2. Carcinoma in situ of the vulva, status post skinning vulvectomy, no    evidence of recurrent disease, pending vulvar biopsy. 3. Menorrhagia, which is inadequately controlled using Provera every other    month; will therefore increase her dose to Provera 10 mg for the first    10 days of each month.  She will return to see me in six months for continuing followup. DD:  06/11/00 TD:  06/12/00 Job: [id number]

## 2010-11-10 NOTE — Consult Note (Signed)
[facility]  Patient:    Natalie Thomas, Natalie Thomas Visit Number: [id number] MRN: [id number]          Service Type: GON Location: GYN Attending Physician:  Sabino Donovan Dictated by:   Jackquline Denmark. Kyla Balzarine, M.D. Admit Date:  04/08/2001   CC:         Malachi Pro. Ambrose Mantle, M.D.  Telford Nab, R.N.  Titus Dubin. Alwyn Ren, M.D. [facility]  Zerita Boers, M.D.  Alfredia Ferguson, M.D.   Consultation Report  REASON FOR CONSULTATION:  Ms. Ladley returns for followup of multifocal lower genital tract dysplasia.  INTERVAL NOTE:  The patient has been unable to follow up since December 2001 because of her chronic psychiatric problems.  She notes no condylomatous lesions.  She has been unable to take her Provera, often forgetting to take it during the first part of the month.  She has been amenorrheic over the past month.  She denies sexual activity at present.  HISTORY OF PRESENT ILLNESS:  The patient had skinning vulvectomy and split-thickness skin graft of the vulva in 1998 and has been followed without evidence of disease.  She subsequently underwent cervical conization in May 2001 for severe squamous dysplasia and had negative margins.  Followup cytology has been negative.  PAST MEDICAL HISTORY:  Past medical history is significant for chronic psychiatric problems which have dominated her accessibility to health care. She is on Depakote, Effexor, Haldol, Klonopin, Cogentin, Prilosec and multivitamins.  Otherwise, past history, personal and social history, family history and review of systems are unchanged from those recorded during admission for her vulvar split-thickness skin graft in December 1998.  ALLERGIES:  She has no allergies.  PHYSICAL EXAMINATION:  VITAL SIGNS:  Weight 216.5 pounds.  GENERAL:  Patient is alert and oriented, in no acute distress.  NODES:  There is no pathologic lymphadenopathy.  ABDOMEN:  Obese, soft and benign, without ascites, mass or  tenderness.  BACK:  There is no back or CVA tenderness.  EXTREMITIES:  Extremities have no edema and they have full range of motion.  PELVIC:  External genitalia have well-healed split-thickness skin graft site without condylomatous lesions or lesions suggesting VIN.  The vagina is clear. Cervix is scarred post conization and there are no lesions.  Bimanual and rectovaginal examinations reveal normal uterus with no adnexal mass or parametrial nodularity.  RECTAL:  Examination is confirmatory.  ASSESSMENT: 1. 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 2. Prior history of oligo-ovulation with menorrhagia; difficult compliance    with Provera.  PLAN:  Pap smear obtained and will be communicated to the patient.  If normal, she can be followed at six-month intervals.  A prescription for Micronor one q.d. was given.  Although the contraceptive effect of Micronor may be changed by her high dose of antipsychotic medications, it will still give a progestin effect on her endometrium and may be easier to remember on a daily basis. Dictated by:   Jackquline Denmark. Kyla Balzarine, M.D. Attending Physician:  Ronita Hipps T DD:  04/08/01 TD:  04/09/01 Job: [id number]

## 2010-11-10 NOTE — Consult Note (Signed)
NAME:  Natalie Thomas, Natalie Thomas NO.:  [id number]   MEDICAL RECORD NO.:  [id number]          PATIENT TYPE:  OUT   LOCATION:  GYN                          FACILITY:  [facility]   PHYSICIAN:  John T. Kyla Balzarine, M.D.    DATE OF BIRTH:  1960-08-14   DATE OF CONSULTATION:  08/15/2005  DATE OF DISCHARGE:  08/15/2005                                   CONSULTATION   CHIEF COMPLAINT:  Longstanding history of lower genital tract dysplasia.   HISTORY OF PRESENT ILLNESS:  The patient had skinning vulvectomy and split-  thickness skin graft for extensive carcinoma in situ of the vulva and  perianal region in 1998. She has been followed without evidence of recurrent  disease. We have not seen her since March 2005. She was recently treated  with antibiotics for bronchitis or pneumonia and notes itching and white  vulvar discharge. She states that she is not using any Depo-Provera and has  sporadic periods. It should be noted that the patient underwent cold knife  conization for CIN May 2001 and had negative surgical margins.   PAST MEDICAL HISTORY:  Chronic psychiatric problem, adult onset diabetes and  obesity; status post cervical and vulvar surgeries.   MEDICATIONS:  Clonazepam, ibuprofen, albuterol, __________,  Declomycin,  diltiazem, fluoxetine, Depakote, potassium, Protonix, Sequel.   ALLERGIES:  Penicillin   PERSONAL AND SOCIAL HISTORY:  The patient's parents have both died and she  lives at [facility].   FAMILY HISTORY:  Unknown.   REVIEW OF SYSTEMS:  Otherwise negative 10/10 systems.   PHYSICAL EXAMINATION:  VITAL SIGNS:  Weight 291 pounds. Vital signs stable.  GENERAL:  The patient is obese in no acute distress, oriented x3.  LUNGS:  Lung fields were clear.  LYMPH:  There is no pathologic lymphadenopathy.  ABDOMEN:  The abdomen is obese with no ascites, mass or hernia palpable.  There is intertrigo around the mons.  PELVIC:  External genitalia have a cottage cheese white  discharge  vaginally with introital erythema. Speculum examination reveals no vaginal  or cervical lesions. Bimanual and rectovaginal examinations are extremely  limited because of obesity. No pathologic enlargement of cervix or uterus  and no distinct adnexal or parametrial mass.   ASSESSMENT:  History of lower genital tract dysplasia. Likely monilial  vulvovaginitis following antibiotics.   PLAN:  Cytology obtained and if normal she can be followed at annual  intervals. She was given a prescription for Diflucan.      John T. Kyla Balzarine, M.D.  Electronically Signed     JTS/MEDQ  D:  08/15/2005  T:  08/17/2005  Job:  [id number]   cc:   Telford Nab, R.N.  [address]  [place], [place] [address]

## 2010-11-10 NOTE — Discharge Summary (Signed)
NAME:  Natalie Thomas, Natalie Thomas                      ACCOUNT NO.:  [id number]   MEDICAL RECORD NO.:  [id number]                   PATIENT TYPE:  INP   LOCATION:  5739                                 FACILITY:  [facility]   PHYSICIAN:  Rene Paci, M.D. [facility]          DATE OF BIRTH:  [birth year]   DATE OF ADMISSION:  12/28/2002  DATE OF DISCHARGE:  12/31/2002                                 DISCHARGE SUMMARY   DISCHARGE DIAGNOSES:  1. Hyponatremia.  2. Psychogenic polydipsia.  3. Hypokalemia.  4. Urinary tract infection.   BRIEF ADMISSION HISTORY:  Ms. Shamoon is a 50 year old white female who is a  resident at [facility]  She was in her usual state of health until she  was found on the day of admission quite lethargic.  The patient has a  history of psychogenic polydipsia and hyponatremia.  This a typical  presentation for her when she is hyponatremic.   On exam in the emergency room, she was lethargic and would not answer  questions.   PAST MEDICAL HISTORY:  1. 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  2. Schizoaffective disorder with mood instability.  3. Adult onset diabetes mellitus.  4. Gastroesophageal reflux disease.  5. Atypical chest pain.  She had been scheduled for an outpatient Cardiolite     on December 29, 2002 which she was not able to make.  6. Chronic bronchitis.  7. Tobacco use.  8. Genital retractive closure with vulvar carcinoma in situ diagnosed in     March 2003.   HOSPITAL COURSE:  #1 - HYPONATREMIA SECONDARY TO POLYDIPSIA:  The patient  was fluid restricted and her sodium was slowly replaced with normal saline  at a slow rate.  The patient's sodium has slowly improved.  Currently her  sodium is 129.  The patient described the need to drink incessantly because  her mouth is dry which she attributes to her other medications.  In talking  with the patient's aunt, she states the patient drinks 24 to  30 cans of diet  soda daily in addition to water incessantly.  We discussed with the patient,  the need for her to follow close fluid restriction; however, it is not clear  if she understands the need for this.   #2 - HYPOKALEMIA:  Corrected.   #3 - URINARY TRACT INFECTION:  The patient has been started on Cipro and has  completed three of seven days in the hospital.   PHYSICAL EXAMINATION AT DISCHARGE:  VITAL SIGNS:  She is afebrile.  Blood  pressure is 138/72.  Heart rate is 63.  Respirations are 20.  O2 saturations  91% on room.  CBG 77.  GENERAL:  This is an overweight middle aged white female in no acute  distress.  HEENT:  Unremarkable.  NECK:  Without carotid bruits, JVD or adenopathy.  LUNGS:  Clear without wheezes, rales or rhonchi.  CARDIOVASCULAR:  Regular without murmurs, rubs or gallops.  ABDOMEN:  Bowel sounds are present.  Nontender.  EXTREMITIES:  Without edema.  NEUROLOGIC:  The patient was alert and oriented without any focal neurologic  deficits.   LABORATORY DATA AT DISCHARGE:  Sodium 129, potassium 3.9, BUN 6, creatinine  0.4. Of note, sodium on admission was 109.  LFTs were normal.  Urine  pregnancy is negative.  Urine drug screen was negative. Urine culture was  not done.   MEDICATIONS AT DISCHARGE:  1. Cogentin 1 mg q.d.  2. Clonidine 0.5 mg q.i.d.  3. Haldol 5 mg q.a.m. and 10 mg in the p.m.  4. Depakote 1 gm b.i.d.  5. Avandia 2 mg q.d.  6. Protonix 40 mg q.d.  7. Vioxx 25 mg q.d.  8. Prozac 20 mg q.d.  9. Albuterol MDI  10.      Cipro 500 b.i.d. for four more days.   ACTIVITY:  She may participate in all activities.  It is okay for this  patient to smoke.  Would lift any smoking restrictions as it may keep her  from drinking incessantly.   DIET:  The patient should be on a diabetic, no concentrated sweets diet.   FLUID RESTRICTIONS:  The patient is on a 1 to 2 liter fluid restriction  q.24h.  This includes all fluids, water, soda, juice and  anything else.   SPECIAL INSTRUCTIONS:  The patient should have a BMET drawn q.week with  results to be forwarded to Dr. Everardo All for monitoring.   FOLLOW UP:  The patient needs to follow up with Dr. Everardo All on Thursday,  January 14, 2003 at 10 a.m.     Cornell Barman, P.A. [facility]                  Rene Paci, M.D. [facility]    LC/MEDQ  D:  12/31/2002  T:  12/31/2002  Job:  [id number]   cc:   Gregary Signs A. Everardo All, M.D. [facility]    cc:   Cleophas Dunker. Everardo All, M.D. [facility]

## 2010-11-10 NOTE — Discharge Summary (Signed)
[facility]  Patient:    Natalie Thomas, Natalie Thomas Visit Number: [id number] MRN: [id number]          Service Type: MED Location: 3W 0357 01 Attending Physician:  Duke Salvia Dictated by:   Corwin Levins, M.D. [facility] Admit Date:  02/19/2001 Disc. Date: 02/24/01                             Discharge Summary  DISCHARGE DIAGNOSES: 1. Hyponatremia secondary to polydipsia. 2. Schizoaffective disorder versus schizophrenia, undifferentiated type,    chronic with acute exacerbation. 3. Polysubstance abuse. 4. Diabetes mellitus type 2.  PSYCHIATRY:  Dr. Jeanie Sewer.  PROCEDURES:  None.  HISTORY AND PHYSICAL:  See dictated from date of admission.  HOSPITAL COURSE:   [name] is a 50 year old white female admitted just several days post-psychiatric hospitalization with concerns about medication compliance and polydipsia. Sodium was 123, given abnormal behavioral symptoms. There was no acute illness, not on lithium. At the outset, suspected for psychogenic polydipsia with a dilute urine, no evidence of cirrhosis or CHF, no history of other malignancy. Urine sodium was elevated. Cortisol and TSH normal. She was given some fluid restriction and normal saline to which she responded nicely. She continued on her outpatient psychiatric medications. She had somewhat fluctuating sodiums, eventually fluid restriction was discontinued, made sure she had her psychiatric medications. At the time of discharge, she appeared to be back to baseline psychiatrically. Sodium was 134, CBG was stable, ambulatory, eating. There was some transient nausea the 24 hours prior to discharge but this had resolved at the time of discharge. As she was otherwise stable, it was felt she had gained maximal benefit from this hospitalization and she is to be discharged to home.  DISPOSITION:   Discharge to home in good condition.  DISCHARGE MEDICATIONS: 1. Actos 15 mg p.o. q.d. 2.  Protonix 40 mg p.o. q.d. 3. Cogentin 1 mg b.i.d. 4. Depakote ER 1500 mg q.h.s. 5. Risperdal 1 mg t.i.d. as well as 5 mg q.h.s. 6. Klonopin 1 mg t.i.d.  DISCHARGE FOLLOWUP:  The patient is to follow up with Dr. Alwyn Ren who is her primary care physician in one to two weeks. Dictated by:   Corwin Levins, M.D. [facility] Attending Physician:  Duke Salvia DD:  02/24/01 TD:  02/24/01 Job: [id number]

## 2010-11-10 NOTE — Consult Note (Signed)
[facility]  Patient:    Natalie Thomas, Natalie Thomas Visit Number: [id number] MRN: [id number]          Service Type: GON Location: GYN Attending Physician:  Sabino Donovan Dictated by:   Jackquline Denmark. Kyla Balzarine, M.D. Proc. Date: 09/16/01 Admit Date:  09/16/2001   CC:         Fortunato Curling, M.D.  Adelene Amas. Williford, M.D.  Telford Nab, R.N.  Titus Dubin. Alwyn Ren, M.D. [facility]   Consultation Report  CHIEF COMPLAINT:  Ms. Buccieri returns for a follow-up of multifocal lower genital tract dysplasia.  INTERVAL NOTE:  The patient returns for follow-up.  She was hospitalized in November with hyponatremia secondary to psychogenic water toxicity.  She has recently moved into a structured living situation and appears to be in better control in regards to her psychiatric problems.  She has been amenorrheic and discontinued oral contraceptives approximately one month ago.  She is currently sexually active.  HISTORY OF PRESENT ILLNESS:  The patient had skinny vulvectomy with split thickness skin graft to the vulva in 1998 and has been followed without evidence of recurrent disease.  She underwent cervical conization in May 2001 for severe squamous dysplasia and had negative margins with negative follow-up cytology.  PAST MEDICAL HISTORY:  Significant for chronic psychiatric problems dominating accessibility to health care, adult-onset diabetes.  PAST SURGICAL HISTORY:  As above.  MEDICATIONS:  Effexor, Klonopin, Depakote, Haldol, Cogentin, Protonix, Actos.  FAMILY HISTORY:  Significant for grandfather with colon cancer.  SOCIAL HISTORY:  Greater then one pack per day tobacco use.  Denies ethanol.  ALLERGIES:  PENICILLIN.  REVIEW OF SYSTEMS:  Essentially otherwise negative.  PHYSICAL EXAMINATION  VITAL SIGNS:  Weight 214 pounds, blood pressure 142/70.  GENERAL:  The patient is alert and oriented and in no acute distress.  NODES:  No pathologic  lymphadenopathy.  ABDOMEN:  Obese, soft, and benign without ascites, mass, or tenderness.  BACK:  No back or CVA tenderness.  EXTREMITIES:  No edema.  PELVIC:  External genitalia and BUS have well healed split thickness skin graft site without condylomatous lesions or lesions suggesting VIN.  The vagina is clear.  Cervix is scarred post conization with no gross lesions. Bimanual and rectovaginal examinations reveal normal uterus with no adnexal mass or parametrial nodularity.  RECTAL:  Confirmatory.  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  The prior history of oligo-ovulation, currently amenorrheic after prolonged Micronor.  PLAN:  Cytology obtained and if normal we can see the patient back on an annual basis.  After discussing options, Depo-Provera 150 mg was given subcutaneously and we will make arrangements for her to receive this every three months at her group home. Dictated by:   Jackquline Denmark. Kyla Balzarine, M.D. Attending Physician:  Ronita Hipps T DD:  09/16/01 TD:  09/17/01 Job: [id number]

## 2010-11-10 NOTE — H&P (Signed)
[facility]  Patient:    CELINA, SHILEY Visit Number: [id number] MRN: [id number]          Service Type: MED Location: 1E 0110 01 Attending Physician:  Osvaldo Human Dictated by:   [name]. Norins, M.D. [facility] Adm. Date:  02/19/2001   CC:         Titus Dubin. Alwyn Ren, M.D. [facility]   History and Physical  CHIEF COMPLAINT:  Hyponatremia.  HISTORY OF PRESENT ILLNESS:  Of note, the patient is an unreliable historian. Ms. Rueckert is a 50 year old, divorced, white female recently discharged from behavioral medicine with a a question of psychotic schizophrenic disease.  The patient has become progressively weak and also increasingly confused and disoriented at home.  She is brought by her family to the emergency department for probable readmission to behavioral medicine.  Initial evaluation revealed the patient to be hyponatremic with a sodium of 123.  She is now admitted for appropriate treatment.  PAST SURGICAL HISTORY: 1. Eye surgery in the past. 2. Vulvar surgery for what she says was a malignant wart. 3. Cholecystectomy.  PAST MEDICAL HISTORY:  She had the usual childhood diseases.  She is a gravida 0, para 0.  She has had multiple episodes of bronchitis.  She is non-insulin dependent diabetic.  She has a history of gastroesophageal reflux disease. She has history of chest pain, but no history of known CAD.  CURRENT MEDICATIONS: 1. Actos 15 mg q.d. 2. Protonix 40 mg q.d. 3. Klonopin 0.1 mg t.i.d. 4. Cogentin 1 mg b.i.d. 5. Depakote ER. 6. Risperdal 2 mg tablets 1/2 tablet t.i.d. and 2-1/2 tablets at bed time.  HABITS:  Tobacco greater than one pack-per-day.  Alcohol use none.  No recreational drug use.  ALLERGIES:  No known drug allergies.  FAMILY HISTORY:  Negative for breast cancer.   Positive for colon cancer in paternal grandfather, positive for CAD, positive for COPD in her mother.  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  She reports that she had taken four credit hours at [employer].  She evidently is divorced.  She is unable to give any additional social history.  REVIEW OF SYSTEMS:  Unremarkable, with the patient having no specific complaints, except generalized weakness.  She specifically denies any chest pain, chest discomfort.  She denies shortness of breath.  She has had no GI complaints.  PHYSICAL EXAMINATION:  VITAL SIGNS:  Temperature 97.5, blood pressure 156/92, heart rate 103, and respirations 20.  GENERAL:  This is an obese, white female in no acute distress.  HEENT:  Normocephalic, atraumatic.  EACs and TMs were unremarkable. Oropharynx with native dentition and good repair with no buccal or palatal lesions.  NECK:  Supple.  No thyromegaly.  NODES:  No adenopathy was appreciated  LUNGS:  Without CVA tenderness.  Lungs clear to auscultation and percussion without rales, wheezes, or rhonchi.  HEART:  2+ radial pulse.  She had no JVD, no carotid bruits.  She had a regular rate and rhythm without murmur, rub or gallop.  BREASTS:  Skin normal, nipple without discharge.  There were no fixed masses, lesions, or abnormalities.  No axillary adenopathy.  ABDOMEN:  Obese, she had a gallbladder surgical scar in right upper quadrant. Bowel sounds positive.  There was no guarding or rebound.  Exam was hindered by the patients obesity, but no obvious organomegaly or splenomegaly was noted.  GENITALIA/RECTAL:  Deferred.  EXTREMITIES:  Without clubbing, cyanosis, or edema.  NEUROLOGIC:  The patient is awake, she is alert, and she does seem to be speaking to people who are not there.  She does seem to confabulate her history and has a stream of consciousness and unrealistic impressions and thoughts.  Cranial nerves II-XII were grossly intact.  Motor strength normal. Cerebellar function was unremarkable with no obvious tremor.  DERMATOLOGIC:  The patient had no skin lesions.  She had  distal lower extremity erythema without signs of obvious infection.  The patient has had pierced ears with what appears to be a tear injuries.  LABORATORY:  Urinalysis with specific gravity 1.003, pH 7.5.  Urine pregnancy negative.  Urine drug screen negative.  Alcohol level negative.  Hemoglobin 13.9, hematocrit 39.3, white count 9200 with a normal differential, and platelet count 278,000.  Sodium 123, potassium 3.9, chloride 90, CO2 26, BUN less than 5, creatinine 0.6, and LFTs were normal.  ASSESSMENT: 1. Hyponatremia.  The patient with no obvious illness.  She is not on lithium.    Suspect she has psychogenic polydipsia with an overly dilute urine.  There    is no evidence of cirrhosis or congestive heart failure.  There is no    history of physical findings consistent with malignancy. 2. Diabetes.  The patients serum glucose at admission was normal. 3. Psychiatric.  The patient with obvious psychiatric illness.  She seems to    have persistent psychotic behavior in the emergency room in regards to    hallucinations and confabulation.  PLAN: 1. The patient is admitted to a regular bed.  Will obtain a spot urine sodium.    Will check a random cortisol level and check TSH.  The patient will have    restrict p.o. fluids and will give her normal saline 75 cc/hr and low-dose    Lasix. 2. Will continue Actos at 15 mg q.d. and will check hemoglobin A1C.  The    patient will be on a diabetic diet. 3. Will continue her medications as on her hospital discharge sheet.  Will    obtain a psychiatric consultation. Dictated by:   [name]. Norins, M.D. [facility] Attending Physician:  Osvaldo Human DD:  02/19/01 TD:  02/19/01 Job: [id number]

## 2010-11-10 NOTE — H&P (Signed)
NAME:  Natalie Thomas, Natalie Thomas                      ACCOUNT NO.:  [id number]   MEDICAL RECORD NO.:  [id number]                   PATIENT TYPE:  INP   LOCATION:  1827                                 FACILITY:  [facility]   PHYSICIAN:  Sean A. Everardo All, M.D. [facility]           DATE OF BIRTH:  [birth year]   DATE OF ADMISSION:  04/15/2003  DATE OF DISCHARGE:                                HISTORY & PHYSICAL   REASON FOR ADMISSION:  Asthma exacerbation.   HISTORY OF PRESENT ILLNESS:  The patient is a 50 year old woman with one day  of moderate shortness of breath, there is associated dry cough and pain at  the throat.  She also states a low-grade fever and feels she might have a  mild respiratory infection which precipitated this illness.   PAST MEDICAL HISTORY:  1. Asthma.  2. Cigarette smoker.  3. Schizophrenia.  4. Hyponatremia, the workup of which has been negative in the office, except     for the probable exacerbation of this by her necessary psychiatric     medications.  5. Epilepsy.  6. GERD.   MEDICATIONS:  1. Prozac 20 mg a day.  2. Avandia 2 mg a day.  3. Potassium chloride 20 mEq twice daily.  4. Protonix 40 mg daily.  5. Depakote ER 1000 mg twice a day.  6. Clonazepam 0.5 mg three times a day.  7. Declomycin 300 mg by mouth daily.   SOCIAL HISTORY:  The patient is single, she is disabled.   FAMILY HISTORY:  No one else close by is ill that might have transmitted to  her this viral infection, if in fact she has it.   REVIEW OF SYSTEMS:  Denies the following:  Chills, rash, loss of  consciousness, headache, dysuria, nausea, vomiting, earache, nasal  congestion, abdominal pain, rectal bleeding, hematuria.   PHYSICAL EXAMINATION:  Blood pressure 91/42, heart rate 78, respiratory rate  16, temperature 99.2.  GENERAL:  No distress.  SKIN:  No diaphoretic.  HEENT:  Head is atraumatic.  Sclera nonicteric.  Pharynx clear.  NECK:  Supple.  CHEST:  Few diffuse expiratory wheezes.  CARDIOVASCULAR:  No JVD, no edema.  Regular rate and rhythm.  No murmur.  ABDOMEN:  Soft and nontender.  No hepatosplenomegaly.  No mass.  EXTREMITIES:  No deformity.  NEUROLOGIC:  Alert, oriented, and cooperative.  She is slightly drowsy,  probably owing to the late hour.   LABORATORY STUDIES:  Remarkable only for sodium of 130.   Chest x-ray; no acute disease.   IMPRESSION:  1. Probably has a minor viral illness.  2. Asthma exacerbation due to #1.  3. Schizophrenia.  4. Chronic hyponatremia, which is improved.  5. History of type 2 diabetes.   PLAN:  1. Solu-Medrol.  2. Bronchodilators.  3. Check CBGs.  4. Continue outpatient medications.  5. I discussed code status with the patient and she requests full code.  Sean A. Everardo All, M.D. [facility]    SAE/MEDQ  D:  04/16/2003  T:  04/16/2003  Job:  [id number]

## 2010-11-10 NOTE — Discharge Summary (Signed)
[facility]  Patient:    Natalie Thomas Visit Number: [id number] MRN: [id number]          Service Type: GON Location: GYN Attending Physician:  Jeannette Corpus Dictated by:   Netta Cedars, M.D. Adm. Date:  02/07/01 Disc. Date: 02/18/01                             Discharge Summary  INTRODUCTION:  Natalie Thomas is a 50 year old white female, who was admitted on involuntary commitment due to onset of psychosis.  She drove herself to the hospital complaining of nausea and pelvic pain.  She was seen in emergency room where no major pathology was found and was transferred to [facility].  Two days prior to reporting to emergency room, patient was discharged from [facility].  She has a long history of mental illness.  Her speech in emergency room was confusing and thoughts showed looseness of associations.  During initial evaluation, she refused to speak and behaving in quite bizarre manner.  PAST PSYCHIATRIC HISTORY:  Previously, she has been followed by Dr. Katrinka Blazing at [facility] and by ____ team.  She has history of noncompliance of medications.  PAST MEDICAL HISTORY:  She has history of diabetes, type 2, and GERD.  MEDICATIONS:  Upon admission, she is supposed to be taking Depakote, Effexor, Risperdal and Actos for diabetes.  PHYSICAL EXAMINATION:  At [facility], she was medically cleared with physical examination unremarkable.  INITIAL IMPRESSION:  Psychosis not otherwise specified, rule out chronic schizophrenia, rule out schizoaffective disorder.  HOSPITAL COURSE:  After admitting to the ward, patient was placed on special observation.  She was started on Depakote ER 500 mg, Risperdal 1 mg three times a day, Klonopin 0.5 mg three times a day, Haldol on a p.r.n. basis.  Seen over the weekend by Dr. Katrinka Blazing.  She still remained agitated but, on Risperdal, which was in the meantime  increased to 2 mg three times a day, she developed some akathisia which prompted to decrease the dose.  On August 19, she was doing better but still expressed some delusions that her brain was replaced by the tape player and still hearing voices played by this player.  I increased Risperdal this time under protection of Cogentin.  Because of signs of depression, I started patient on low dose of Effexor.  Due to recurrent hallucinations, Seroquel was added p.r.n. and later, due to good reaction to this, was on steady basis prescribed at bedtime.  Subsequently, Seroquel was increased.  Gradually, patient started improving.  Medication has been adjusted up to the dose used at the time of discharge.  The patient tolerated increase of medication well without any side effects.  Capillary blood sugar was close to normal.  On August 27, she presented with no hallucinations, more alert, said the depression had lifted.  There was still some paranoia but no active hallucinations.  Compliant with medication.  Improved insight and judgment.  Upon patient observation and history, diagnosis has been changed.  DISCHARGE DIAGNOSES: Axis I:    Schizoaffective disorder, bipolar-type, mixed. Axis II:   Borderline personality disorder. Axis III:  1. Gastroesophageal reflux disease.            2. Diabetes mellitus, type 2. Axis IV:   Moderate stressors. Axis V:    Global Assessment of Functioning upon admission 30; maximum for  past year 35; upon discharge 41.  LABORATORY DATA:  Normal CBC and differentiation.  Borderline low sodium and potassium in one reading, normal in the second reading.  Normal liver function tests.  Valproic acid, at the time of discharge, 82.6.  TSH was normal.  DISCHARGE MEDICATIONS: 1. Cogentin 1 mg twice a day. 2. Risperdal 2 mg, 2-1/2 tablets at night and 1/2 tablet three times a day. 3. Depakote ER 3-1/2 tablets at bedtime. 4. Klonopin 0.5 mg three times a day. 5.  Protonix 40 mg daily. 6. Actos 50 mg daily. 7. Effexor 150 mg daily.  DISCHARGE RECOMMENDATIONS:  The patient is supposed to follow a diabetic diet. If recurrence of symptoms or serious side effects from medication, patient should come to emergency room.  Otherwise she will be followed by _______ team clinic on August 29 at 2:30 p.m., which was a Thursday.  The patient understood instructions and, in good condition, was discharged home. Dictated by:   Netta Cedars, M.D. Attending Physician:  Jeannette Corpus DD:  04/02/01 TD:  04/04/01 Job: [id number]

## 2010-11-10 NOTE — Discharge Summary (Signed)
NAMEMarland Kitchen  KENZLEY, KE NO.:  [id number]   MEDICAL RECORD NO.:  [id number]          PATIENT TYPE:  IPS   LOCATION:  0407                          [facility]ACILITY:  [facility]   PHYSICIAN:  Jeanice Lim, M.D. DATE OF BIRTH:  31-Dec-1960   DATE OF ADMISSION:  08/08/2005  DATE OF DISCHARGE:  08/16/2005                                 DISCHARGE SUMMARY   IDENTIFYING DATA:  This is a 50 year old female involuntarily admitted.  Admitted by [facility] with symptoms of active  visual and auditory hallucinations, very agitated, emotional and labile.  Positive for suicidal ideation with plans to overdose.  Positive delusions,  hyperreligious, had been actively delusional, believing that she was  pregnant.   PAST PSYCHIATRIC HISTORY:  History of schizoaffective disorder, __________  had been treated last at [facility] in February.   ALCOHOL/DRUG HISTORY:  Significant for tobacco.   MEDICAL PROBLEMS:  History of pneumonia, epilepsy, chronic obstructive  pulmonary disease, obesity, diabetes mellitus, type 2, hyponatremia, hernia  repair, vulvectomy.   MEDICATIONS:  Depakote, Wellbutrin, Diltiazem, loratadine, Cal-Citrate,  Fosamax and Declomycin.   ALLERGIES:  PENICILLIN, BIAXIN.   PHYSICAL EXAMINATION:  Physical and neurologic exam within normal limits.   REVIEW OF SYSTEMS:  Essentially negative.   MENTAL STATUS EXAM:  Sedated but able to arouse easily but then falling back  asleep.  Unable to fully evaluate mood and thought process, reporting  hallucinations and was cognitively intact.  Judgment and insight highly  impaired.   ADMISSION DIAGNOSES:  AXIS I:  Schizoaffective disorder not otherwise  specified.  AXIS II:  Deferred.  AXIS III:  See past medical history.  AXIS IV:  Moderate (stressors with limited support system, multiple medical  problems and financial stress and other psychosocial issues).  AXIS V:  30/55.   HOSPITAL  COURSE:  The patient was admitted and ordered routine p.r.n.  medications and underwent further monitoring.  Was placed on safety checks.  Monitored fluid intake.  Monitored medically closely and medical consult was  considered versus chest x-ray if not improving.  The patient was optimized  on medications and began to participate more appropriately in treatment.  Reporting a positive response to medication changes and working on a relapse  prevention plan.  The patient was gradually noted to improve with improved  sleep, appetite and decreased medical complaints, tolerating medications  well.  Showing a robust response.  Medication education was given again at  the time of discharge and reviewed.   DISCHARGE MEDICATIONS:  1.  Loxitane 10 mg t.i.d.  2.  Lactulose 15 mL each day as needed.  3.  Depakote ER 500 mg, 4 at night.  4.  Fosamax 70 mg, 1 every seven days, next due on February 29, 2007.  5.  Ventolin 2.5/3 mL nebulizer solution twice a day and at night.  6.  Atrovent 0.5/2.5 mL nebulizer solution twice a day and at night.  7.  Seroquel 300 mg in the morning and 600 mg at night.  8.  Klonopin 0.5 mg three times a day.  9.  Diltiazem 180 mg  in the morning.  10. Declomycin 300 mg in the morning.  11. Claritin 10 mg in the morning.  12. Protonix 40 mg in the morning.  13. Benadryl 25 mg twice a day.  14. Artane 2 mg in the morning and at 6 p.m.  15. Colace 100 mg twice a day.  16. Wellbutrin XL 150 mg in the morning.  17. Os-Cal 500+D 1 three times a day with meals.   FOLLOW UP:  The patient was to follow up with Dr. Lang Snow on Thursday,  August 21, 2005 at 1:30 p.m.   DISCHARGE DIAGNOSES:  AXIS I:  Schizoaffective disorder not otherwise  specified.  AXIS II:  Deferred.  AXIS III:  See past medical history.  AXIS IV:  Moderate (stressors with limited support system, multiple medical  problems and financial stress and other psychosocial issues).  AXIS V:  GAF on discharge  55.      Jeanice Lim, M.D.  Electronically Signed     JEM/MEDQ  D:  09/05/2005  T:  09/06/2005  Job:  [id number]

## 2010-11-11 LAB — URINE CULTURE
Colony Count: >=100000
Culture  Setup Time: 201205180414

## 2011-01-06 ENCOUNTER — Emergency Department (HOSPITAL_COMMUNITY)
Admission: EM | Admit: 2011-01-06 | Discharge: 2011-01-06 | Disposition: A | Discharge status: Home / Self Care | Payer: Medicare Other | Attending: Emergency Medicine | Admitting: Emergency Medicine

## 2011-01-06 ENCOUNTER — Emergency Department (HOSPITAL_COMMUNITY)
Admission: EM | Admit: 2011-01-06 | Discharge: 2011-01-06 | Discharge status: Against Medical Advice | Payer: Medicare Other | Attending: Emergency Medicine | Admitting: Emergency Medicine

## 2011-01-06 DIAGNOSIS — I10 Essential (primary) hypertension: Secondary | ICD-10-CM | POA: Insufficient documentation

## 2011-01-06 DIAGNOSIS — Y9210 Unspecified residential institution as the place of occurrence of the external cause: Secondary | ICD-10-CM | POA: Insufficient documentation

## 2011-01-06 DIAGNOSIS — Z79899 Other long term (current) drug therapy: Secondary | ICD-10-CM | POA: Insufficient documentation

## 2011-01-06 DIAGNOSIS — G40909 Epilepsy, unspecified, not intractable, without status epilepticus: Secondary | ICD-10-CM | POA: Insufficient documentation

## 2011-01-06 DIAGNOSIS — R52 Pain, unspecified: Secondary | ICD-10-CM | POA: Insufficient documentation

## 2011-01-06 DIAGNOSIS — J4489 Other specified chronic obstructive pulmonary disease: Secondary | ICD-10-CM | POA: Insufficient documentation

## 2011-01-06 DIAGNOSIS — J449 Chronic obstructive pulmonary disease, unspecified: Secondary | ICD-10-CM | POA: Insufficient documentation

## 2011-01-06 DIAGNOSIS — F319 Bipolar disorder, unspecified: Secondary | ICD-10-CM | POA: Insufficient documentation

## 2011-01-06 DIAGNOSIS — W010XXA Fall on same level from slipping, tripping and stumbling without subsequent striking against object, initial encounter: Secondary | ICD-10-CM | POA: Insufficient documentation

## 2011-01-06 DIAGNOSIS — T1490XA Injury, unspecified, initial encounter: Secondary | ICD-10-CM | POA: Insufficient documentation

## 2011-01-06 DIAGNOSIS — N390 Urinary tract infection, site not specified: Secondary | ICD-10-CM | POA: Insufficient documentation

## 2011-01-06 DIAGNOSIS — F17200 Nicotine dependence, unspecified, uncomplicated: Secondary | ICD-10-CM | POA: Insufficient documentation

## 2011-01-06 LAB — URINALYSIS, ROUTINE W REFLEX MICROSCOPIC
Bilirubin Urine: NEGATIVE
Protein, ur: NEGATIVE mg/dL
Urobilinogen, UA: 0.2 mg/dL (ref 0.0–1.0)

## 2011-01-06 LAB — URINE MICROSCOPIC-ADD ON

## 2011-02-16 ENCOUNTER — Emergency Department (HOSPITAL_COMMUNITY)
Admission: EM | Admit: 2011-02-16 | Discharge: 2011-02-16 | Disposition: A | Discharge status: Home / Self Care | Payer: Medicare Other | Attending: Emergency Medicine | Admitting: Emergency Medicine

## 2011-02-16 ENCOUNTER — Emergency Department (HOSPITAL_COMMUNITY): Payer: Medicare Other

## 2011-02-16 DIAGNOSIS — N390 Urinary tract infection, site not specified: Secondary | ICD-10-CM | POA: Insufficient documentation

## 2011-02-16 DIAGNOSIS — S335XXA Sprain of ligaments of lumbar spine, initial encounter: Secondary | ICD-10-CM | POA: Insufficient documentation

## 2011-02-16 DIAGNOSIS — M545 Low back pain, unspecified: Secondary | ICD-10-CM | POA: Insufficient documentation

## 2011-02-16 DIAGNOSIS — M25559 Pain in unspecified hip: Secondary | ICD-10-CM | POA: Insufficient documentation

## 2011-02-16 DIAGNOSIS — R109 Unspecified abdominal pain: Secondary | ICD-10-CM | POA: Insufficient documentation

## 2011-02-16 DIAGNOSIS — W010XXA Fall on same level from slipping, tripping and stumbling without subsequent striking against object, initial encounter: Secondary | ICD-10-CM | POA: Insufficient documentation

## 2011-02-16 DIAGNOSIS — Z9889 Other specified postprocedural states: Secondary | ICD-10-CM | POA: Insufficient documentation

## 2011-02-16 DIAGNOSIS — I10 Essential (primary) hypertension: Secondary | ICD-10-CM | POA: Insufficient documentation

## 2011-02-16 LAB — URINALYSIS, ROUTINE W REFLEX MICROSCOPIC
Glucose, UA: 100 mg/dL — AB
Protein, ur: NEGATIVE mg/dL
pH: 7 (ref 5.0–8.0)

## 2011-02-16 LAB — URINE MICROSCOPIC-ADD ON

## 2011-02-19 LAB — URINE CULTURE
Colony Count: >=100000
Culture  Setup Time: 201208250113

## 2011-03-12 ENCOUNTER — Ambulatory Visit: Payer: Medicare Other | Admitting: Endocrinology

## 2011-03-19 LAB — ACETAMINOPHEN LEVEL: Acetaminophen (Tylenol), Serum: <10 — ABNORMAL LOW

## 2011-03-19 LAB — MAGNESIUM: Magnesium: 1.9

## 2011-03-19 LAB — CBC
Hemoglobin: 16.2 — ABNORMAL HIGH
MCHC: 35.3
MCV: 92.3
RBC: 4.98

## 2011-03-19 LAB — BASIC METABOLIC PANEL
BUN: 3 — ABNORMAL LOW
BUN: 3 — ABNORMAL LOW
CO2: 22
Calcium: 8.2 — ABNORMAL LOW
Calcium: 8.5
Calcium: 9
Creatinine, Ser: 0.48
Creatinine, Ser: 0.51
Creatinine, Ser: 0.53
GFR calc Af Amer: >60
GFR calc non Af Amer: >60
GFR calc non Af Amer: >60
Glucose, Bld: 128 — ABNORMAL HIGH
Sodium: 127 — ABNORMAL LOW
Sodium: 127 — ABNORMAL LOW

## 2011-03-19 LAB — DIFFERENTIAL
Lymphocytes Relative: 15
Lymphs Abs: 1.3
Neutrophils Relative %: 74

## 2011-03-19 LAB — URINALYSIS, ROUTINE W REFLEX MICROSCOPIC
Bilirubin Urine: NEGATIVE
Glucose, UA: NEGATIVE
Hgb urine dipstick: NEGATIVE
Protein, ur: NEGATIVE

## 2011-03-19 LAB — VALPROIC ACID LEVEL: Valproic Acid Lvl: 65

## 2011-03-19 LAB — RAPID URINE DRUG SCREEN, HOSP PERFORMED
Amphetamines: NOT DETECTED
Barbiturates: NOT DETECTED
Benzodiazepines: NOT DETECTED

## 2011-03-19 LAB — OSMOLALITY, URINE: Osmolality, Ur: 78 — ABNORMAL LOW

## 2011-03-19 LAB — OSMOLALITY: Osmolality: 257 — ABNORMAL LOW

## 2011-03-19 LAB — COMPREHENSIVE METABOLIC PANEL
CO2: 29
Calcium: 9.7
Creatinine, Ser: 0.45
GFR calc non Af Amer: >60
Glucose, Bld: 137 — ABNORMAL HIGH

## 2011-03-19 LAB — HEMOGLOBIN A1C
Hgb A1c MFr Bld: 6.1
Mean Plasma Glucose: 133

## 2011-03-19 LAB — CREATININE, URINE, RANDOM: Creatinine, Urine: 17.2

## 2011-03-19 LAB — PHENYTOIN LEVEL, TOTAL: Phenytoin Lvl: <2.5 — ABNORMAL LOW

## 2011-03-20 ENCOUNTER — Ambulatory Visit: Payer: Medicare Other | Admitting: Endocrinology

## 2011-03-20 DIAGNOSIS — Z0289 Encounter for other administrative examinations: Secondary | ICD-10-CM

## 2011-03-20 LAB — POCT I-STAT, CHEM 8
Calcium, Ion: 1.02 — ABNORMAL LOW
Glucose, Bld: 126 — ABNORMAL HIGH
HCT: 46
Hemoglobin: 15.6 — ABNORMAL HIGH
Potassium: 3.5
TCO2: 25

## 2011-03-20 LAB — BASIC METABOLIC PANEL
BUN: 5 — ABNORMAL LOW
BUN: 5 — ABNORMAL LOW
BUN: 6
BUN: 6
CO2: 21
CO2: 26
Calcium: 8.7
Calcium: 8.8
Calcium: 9.3
Calcium: 9.3
Chloride: 103
Chloride: 82 — ABNORMAL LOW
Chloride: 94 — ABNORMAL LOW
Creatinine, Ser: 0.45
Creatinine, Ser: 0.45
Creatinine, Ser: 0.5
Creatinine, Ser: 0.52
GFR calc Af Amer: >60
GFR calc Af Amer: >60
GFR calc Af Amer: >60
GFR calc non Af Amer: >60
GFR calc non Af Amer: >60
Glucose, Bld: 109 — ABNORMAL HIGH
Glucose, Bld: 110 — ABNORMAL HIGH
Glucose, Bld: 82
Glucose, Bld: 84
Potassium: 3.8
Potassium: 4.7
Potassium: 3.9

## 2011-03-20 LAB — ETHANOL: Alcohol, Ethyl (B): <5

## 2011-03-20 LAB — POCT CARDIAC MARKERS
CKMB, poc: 2.1
Myoglobin, poc: 49.6
Myoglobin, poc: 62.5
Operator id: 4295
Operator id: 288331
Troponin i, poc: <0.05

## 2011-03-20 LAB — CARDIAC PANEL(CRET KIN+CKTOT+MB+TROPI)
CK, MB: 1.5
CK, MB: 1.7
Relative Index: INVALID
Total CK: 57
Troponin I: <0.01

## 2011-03-20 LAB — DIFFERENTIAL
Lymphs Abs: 1.3
Monocytes Relative: 7
Neutro Abs: 5.4
Neutrophils Relative %: 74

## 2011-03-20 LAB — HEPATIC FUNCTION PANEL
Albumin: 3.3 — ABNORMAL LOW
Alkaline Phosphatase: 55
Bilirubin, Direct: <0.1
Total Bilirubin: 0.4

## 2011-03-20 LAB — LIPASE, BLOOD: Lipase: 20

## 2011-03-20 LAB — CBC
MCV: 92.8
RBC: 4.98
WBC: 7.2

## 2011-03-20 LAB — HEMOGLOBIN A1C: Mean Plasma Glucose: 136

## 2011-03-20 LAB — RAPID URINE DRUG SCREEN, HOSP PERFORMED: Benzodiazepines: NOT DETECTED

## 2011-03-22 LAB — CBC
Hemoglobin: 13.7
MCHC: 34.5
MCHC: 34.3
MCHC: 34.7
Platelets: 318
RBC: 4.74
RBC: 4.11
RDW: 14
WBC: 8.6
WBC: 10.5

## 2011-03-22 LAB — URINALYSIS, ROUTINE W REFLEX MICROSCOPIC
Nitrite: NEGATIVE
Specific Gravity, Urine: 1.007
Urobilinogen, UA: 0.2

## 2011-03-22 LAB — BASIC METABOLIC PANEL
BUN: 6
CO2: 24
CO2: 24
Calcium: 8.8
Calcium: 9.2
Creatinine, Ser: 0.54
GFR calc Af Amer: >60
GFR calc non Af Amer: >60
Glucose, Bld: 159 — ABNORMAL HIGH

## 2011-03-22 LAB — URINE MICROSCOPIC-ADD ON

## 2011-03-22 LAB — APTT: aPTT: 40 — ABNORMAL HIGH

## 2011-03-22 LAB — POCT I-STAT, CHEM 8
Creatinine, Ser: 0.7
Hemoglobin: 16 — ABNORMAL HIGH
Potassium: 3.8
Sodium: 132 — ABNORMAL LOW

## 2011-03-22 LAB — PROTIME-INR: INR: 1

## 2011-03-23 LAB — URINE CULTURE: Colony Count: NO GROWTH

## 2011-03-23 LAB — URINALYSIS, ROUTINE W REFLEX MICROSCOPIC
Bilirubin Urine: NEGATIVE
Nitrite: NEGATIVE
Protein, ur: NEGATIVE
Specific Gravity, Urine: 1.006
Urobilinogen, UA: 0.2

## 2011-03-23 LAB — URINE MICROSCOPIC-ADD ON

## 2011-03-31 ENCOUNTER — Emergency Department (HOSPITAL_COMMUNITY)
Admission: EM | Admit: 2011-03-31 | Discharge: 2011-03-31 | Disposition: A | Discharge status: Home / Self Care | Payer: Medicare Other | Attending: Emergency Medicine | Admitting: Emergency Medicine

## 2011-03-31 DIAGNOSIS — E119 Type 2 diabetes mellitus without complications: Secondary | ICD-10-CM | POA: Insufficient documentation

## 2011-03-31 DIAGNOSIS — R6889 Other general symptoms and signs: Secondary | ICD-10-CM | POA: Insufficient documentation

## 2011-03-31 DIAGNOSIS — J449 Chronic obstructive pulmonary disease, unspecified: Secondary | ICD-10-CM | POA: Insufficient documentation

## 2011-03-31 DIAGNOSIS — M545 Low back pain, unspecified: Secondary | ICD-10-CM | POA: Insufficient documentation

## 2011-03-31 DIAGNOSIS — F259 Schizoaffective disorder, unspecified: Secondary | ICD-10-CM | POA: Insufficient documentation

## 2011-03-31 DIAGNOSIS — F319 Bipolar disorder, unspecified: Secondary | ICD-10-CM | POA: Insufficient documentation

## 2011-03-31 DIAGNOSIS — R569 Unspecified convulsions: Secondary | ICD-10-CM | POA: Insufficient documentation

## 2011-03-31 DIAGNOSIS — E669 Obesity, unspecified: Secondary | ICD-10-CM | POA: Insufficient documentation

## 2011-03-31 DIAGNOSIS — M810 Age-related osteoporosis without current pathological fracture: Secondary | ICD-10-CM | POA: Insufficient documentation

## 2011-03-31 DIAGNOSIS — J4489 Other specified chronic obstructive pulmonary disease: Secondary | ICD-10-CM | POA: Insufficient documentation

## 2011-03-31 DIAGNOSIS — I10 Essential (primary) hypertension: Secondary | ICD-10-CM | POA: Insufficient documentation

## 2011-03-31 LAB — URINALYSIS, ROUTINE W REFLEX MICROSCOPIC
Glucose, UA: NEGATIVE mg/dL
Ketones, ur: NEGATIVE mg/dL
Leukocytes, UA: NEGATIVE
Nitrite: NEGATIVE
Protein, ur: NEGATIVE mg/dL

## 2011-04-01 LAB — URINE CULTURE: Colony Count: 10000

## 2011-04-06 ENCOUNTER — Ambulatory Visit: Payer: Medicare Other | Admitting: Endocrinology

## 2011-04-09 ENCOUNTER — Encounter: Payer: Self-pay | Admitting: Endocrinology

## 2011-04-09 ENCOUNTER — Ambulatory Visit (INDEPENDENT_AMBULATORY_CARE_PROVIDER_SITE_OTHER): Payer: Medicare Other | Admitting: Endocrinology

## 2011-04-09 ENCOUNTER — Other Ambulatory Visit (INDEPENDENT_AMBULATORY_CARE_PROVIDER_SITE_OTHER): Payer: Medicare Other

## 2011-04-09 VITALS — BP 156/84 | HR 89 | Temp 98.0°F | Wt 208.0 lb

## 2011-04-09 DIAGNOSIS — E119 Type 2 diabetes mellitus without complications: Secondary | ICD-10-CM

## 2011-04-09 DIAGNOSIS — Z79899 Other long term (current) drug therapy: Secondary | ICD-10-CM

## 2011-04-09 DIAGNOSIS — D751 Secondary polycythemia: Secondary | ICD-10-CM

## 2011-04-09 DIAGNOSIS — E871 Hypo-osmolality and hyponatremia: Secondary | ICD-10-CM

## 2011-04-09 DIAGNOSIS — C519 Malignant neoplasm of vulva, unspecified: Secondary | ICD-10-CM

## 2011-04-09 DIAGNOSIS — M810 Age-related osteoporosis without current pathological fracture: Secondary | ICD-10-CM

## 2011-04-09 DIAGNOSIS — Z23 Encounter for immunization: Secondary | ICD-10-CM

## 2011-04-09 DIAGNOSIS — M519 Unspecified thoracic, thoracolumbar and lumbosacral intervertebral disc disorder: Secondary | ICD-10-CM

## 2011-04-09 HISTORY — DX: Unspecified thoracic, thoracolumbar and lumbosacral intervertebral disc disorder: M51.9

## 2011-04-09 HISTORY — DX: Secondary polycythemia: D75.1

## 2011-04-09 LAB — MICROALBUMIN / CREATININE URINE RATIO
Creatinine,U: 8.7 mg/dL
Microalb, Ur: 0.2 mg/dL (ref 0.0–1.9)

## 2011-04-09 LAB — HEPATIC FUNCTION PANEL
AST: 11 U/L (ref 0–37)
Albumin: 4.2 g/dL (ref 3.5–5.2)

## 2011-04-09 LAB — CBC WITH DIFFERENTIAL/PLATELET
Basophils Absolute: 0.1 10*3/uL (ref 0.0–0.1)
Eosinophils Absolute: 0.1 10*3/uL (ref 0.0–0.7)
HCT: 48.7 % — ABNORMAL HIGH (ref 36.0–46.0)
Hemoglobin: 16.5 g/dL — ABNORMAL HIGH (ref 12.0–15.0)
Lymphs Abs: 2 10*3/uL (ref 0.7–4.0)
MCHC: 33.8 g/dL (ref 30.0–36.0)
Monocytes Relative: 3.9 % (ref 3.0–12.0)
Neutro Abs: 7.5 10*3/uL (ref 1.4–7.7)
RDW: 14.5 % (ref 11.5–14.6)

## 2011-04-09 LAB — URINALYSIS, ROUTINE W REFLEX MICROSCOPIC
Bilirubin Urine: NEGATIVE
Ketones, ur: NEGATIVE
Leukocytes, UA: NEGATIVE
Urine Glucose: NEGATIVE
pH: 6.5 (ref 5.0–8.0)

## 2011-04-09 LAB — BASIC METABOLIC PANEL
BUN: 9 mg/dL (ref 6–23)
Chloride: 100 mEq/L (ref 96–112)
GFR: 170.07 mL/min (ref >60.00)
Potassium: 4 mEq/L (ref 3.5–5.1)
Sodium: 133 mEq/L — ABNORMAL LOW (ref 135–145)

## 2011-04-09 LAB — TSH: TSH: 0.95 u[IU]/mL (ref 0.35–5.50)

## 2011-04-09 LAB — LIPID PANEL
Cholesterol: 134 mg/dL (ref 0–200)
HDL: 48.9 mg/dL (ref >39.00)
LDL Cholesterol: 69 mg/dL (ref 0–99)
VLDL: 16.2 mg/dL (ref 0.0–40.0)

## 2011-04-09 LAB — HEMOGLOBIN A1C: Hgb A1c MFr Bld: 5.9 % (ref 4.6–6.5)

## 2011-04-09 NOTE — Patient Instructions (Addendum)
blood tests are being requested for you today.  please call [phone] to hear your test results.  You will be prompted to enter the 9-digit "MRN" number that appears at the top left of this page, followed by #.  Then you will hear the message. Please come back for a follow-up appointment in 1 week.  Please bring all of you medication bottles with you.   Refer to pain and gynecologist specialists.  you will receive phone calls, about days and times for appointments. (update: i left message on phone-tree:  Stop smoking, as you have polycythemia).

## 2011-04-09 NOTE — Progress Notes (Signed)
Subjective:    Patient ID: Natalie Thomas, female    DOB: 1961-05-28, 50 y.o.   MRN: [id number]  HPI Pt says she has lost 70 lbs since last ov, approx 3 years ago. Pt states few years of severe low-back pain.  No assoc numbness.  i reviewed the [place] controlled substance database, and she has plenty of refills of vicodin and klonopin. She has not had f/u of vulvar cancer x a few years.   She sees psych at [facility]. Past Medical History  Diagnosis Date  . COPD (chronic obstructive pulmonary disease)   . Depression   . GERD (gastroesophageal reflux disease)   . Hypertension   . Osteoporosis   . Diabetes mellitus, type 2     No past surgical history on file.  History   Social History  . Marital Status: Divorced    Spouse Name: N/A    Number of Children: N/A  . Years of Education: N/A   Occupational History  . Disabled    Social History Main Topics  . Smoking status: Current Everyday Smoker -- 1.0 packs/day    Types: Cigarettes  . Smokeless tobacco: Not on file  . Alcohol Use: Not on file  . Drug Use: Not on file  . Sexually Active: Not on file   Other Topics Concern  . Not on file   Social History Narrative   single    Current Outpatient Prescriptions on File Prior to Visit  Medication Sig Dispense Refill  . Calcium Carbonate-Vitamin D (CALTRATE 600+D) 600-400 MG-UNIT per tablet Take 1 tablet by mouth 3 (three) times daily with meals.       Marland Kitchen diltiazem (DILACOR XR) 240 MG 24 hr capsule Take 240 mg by mouth daily.        Marland Kitchen docusate sodium (COLACE) 100 MG capsule Take 100 mg by mouth daily.        . Fluticasone-Salmeterol (ADVAIR DISKUS) 250-50 MCG/DOSE AEPB Inhale 1 puff into the lungs every 12 (twelve) hours.        . topiramate (TOPAMAX) 100 MG tablet Take 100 mg by mouth 2 (two) times daily.          Allergies  Allergen Reactions  . Biaxin   . Penicillins     REACTION: Nausea    No family history on file.  BP 156/84  Pulse 89  Temp(Src) 98 F (36.7 C)  (Oral)  Wt 208 lb (94.348 kg)  SpO2 95%     Review of Systems Denies fever and dysuria.    Objective:   Physical Exam VITAL SIGNS:  See vs page GENERAL: no distress.  In wheelchair.  head: no deformity eyes: no periorbital swelling, no proptosis external nose and ears are normal mouth: no lesion seen Neck: supple  Spine: nontender Pulses: dorsalis pedis intact bilat.   Feet: no deformity.  no ulcer on the feet.  feet are of normal color and temp.  no edema.  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   Neuro: sensation is intact to touch on the feet. PSYCH: Alert and oriented x 3.  Does not appear anxious nor depressed. Skin: not diaphoretic. Msk: gait is normal and steady.     Lab Results  Component Value Date   WBC 9.9 04/09/2011   HGB 16.5* 04/09/2011   HCT 48.7* 04/09/2011   PLT 226.0 04/09/2011   GLUCOSE 103* 04/09/2011   CHOL 134 04/09/2011   TRIG 81.0 04/09/2011   HDL 48.90 04/09/2011   LDLCALC 69 04/09/2011  ALT 12 04/09/2011   AST 11 04/09/2011   NA 133* 04/09/2011   K 4.0 04/09/2011   CL 100 04/09/2011   CREATININE 0.4 04/09/2011   BUN 9 04/09/2011   CO2 23 04/09/2011   TSH 0.95 04/09/2011   INR 1.0 01/04/2008   HGBA1C 5.9 04/09/2011   MICROALBUR 0.2 04/09/2011      Assessment & Plan:  Severe low-back pain, due to oa Vulvar cancer.  She needs f/u Polycythemia, new.  Due to smoking Hyponatremia, mild Dm, well-controlled

## 2011-04-10 LAB — PTH, INTACT AND CALCIUM: Calcium, Total (PTH): 9.3 mg/dL (ref 8.4–10.5)

## 2011-04-13 ENCOUNTER — Telehealth: Payer: Self-pay

## 2011-04-13 NOTE — Telephone Encounter (Signed)
Pt called requesting referral to a GYN specialist as discussed at OV

## 2011-04-13 NOTE — Telephone Encounter (Signed)
Pt informed

## 2011-04-13 NOTE — Telephone Encounter (Signed)
Done

## 2011-04-16 ENCOUNTER — Encounter: Payer: Self-pay | Admitting: Endocrinology

## 2011-04-16 ENCOUNTER — Ambulatory Visit (INDEPENDENT_AMBULATORY_CARE_PROVIDER_SITE_OTHER): Payer: Medicare Other | Admitting: Endocrinology

## 2011-04-16 ENCOUNTER — Ambulatory Visit: Payer: Medicare Other | Admitting: Endocrinology

## 2011-04-16 VITALS — BP 130/67 | HR 71 | Temp 97.3°F | Wt 209.0 lb

## 2011-04-16 DIAGNOSIS — G8929 Other chronic pain: Secondary | ICD-10-CM

## 2011-04-16 DIAGNOSIS — M810 Age-related osteoporosis without current pathological fracture: Secondary | ICD-10-CM

## 2011-04-16 DIAGNOSIS — R252 Cramp and spasm: Secondary | ICD-10-CM

## 2011-04-16 MED ORDER — IPRATROPIUM-ALBUTEROL 18-103 MCG/ACT IN AERO: 2.0000 | INHALATION_SPRAY | Freq: Four times a day (QID) | RESPIRATORY_TRACT | Status: DC | PRN

## 2011-04-16 NOTE — Progress Notes (Signed)
Subjective:    Patient ID: Natalie Thomas, female    DOB: 29-Aug-1960, 50 y.o.   MRN: [id number]  HPI Pt says she never taken prescription medication for the osteoporosis. Pt states a few years of moderate cramps, worst on the legs.  She has assoc arthralgias of the hips.  Past Medical History  Diagnosis Date  . COPD (chronic obstructive pulmonary disease)   . Depression   . GERD (gastroesophageal reflux disease)   . Hypertension   . Osteoporosis   . Diabetes mellitus, type 2     No past surgical history on file.  History   Social History  . Marital Status: Divorced    Spouse Name: N/A    Number of Children: N/A  . Years of Education: N/A   Occupational History  . Disabled    Social History Main Topics  . Smoking status: Current Everyday Smoker -- 1.0 packs/day    Types: Cigarettes  . Smokeless tobacco: Not on file  . Alcohol Use: Not on file  . Drug Use: Not on file  . Sexually Active: Not on file   Other Topics Concern  . Not on file   Social History Narrative   single    Current Outpatient Prescriptions on File Prior to Visit  Medication Sig Dispense Refill  . aspirin 81 MG tablet Take 81 mg by mouth daily.        . Calcium Carbonate-Vitamin D (CALTRATE 600+D) 600-400 MG-UNIT per tablet Take 1 tablet by mouth 3 (three) times daily with meals.       . cholecalciferol (VITAMIN D) 1000 UNITS tablet Take 1,000 Units by mouth daily.        . clonazePAM (KLONOPIN) 1 MG tablet Take 1 mg by mouth 3 (three) times daily.        Marland Kitchen diltiazem (DILACOR XR) 240 MG 24 hr capsule Take 240 mg by mouth daily.        . divalproex (DEPAKOTE) 250 MG DR tablet Take 250 mg by mouth 2 (two) times daily.        Marland Kitchen docusate sodium (COLACE) 100 MG capsule Take 100 mg by mouth daily.        . DULoxetine (CYMBALTA) 30 MG capsule Take 30 mg by mouth daily.        . Fluticasone-Salmeterol (ADVAIR DISKUS) 250-50 MCG/DOSE AEPB Inhale 1 puff into the lungs every 12 (twelve) hours.        .  furosemide (LASIX) 40 MG tablet Take 40 mg by mouth daily.        Marland Kitchen HYDROcodone-acetaminophen (NORCO) 7.5-325 MG per tablet Take 1 tablet by mouth every 6 (six) hours as needed.        Marland Kitchen ibuprofen (ADVIL,MOTRIN) 600 MG tablet Take 600 mg by mouth 3 (three) times daily as needed.        . metFORMIN (GLUCOPHAGE) 500 MG tablet Take 500 mg by mouth 2 (two) times daily.       Marland Kitchen omeprazole (PRILOSEC) 20 MG capsule Take 20 mg by mouth daily.        Marland Kitchen oxyCODONE (OXYCONTIN) 10 MG 12 hr tablet 2 tablets by mouth every morning       . perphenazine (TRILAFON) 8 MG tablet Take 8 mg by mouth 2 (two) times daily.        . potassium chloride (MICRO-K) 10 MEQ CR capsule Take 20 mEq by mouth daily.        . pravastatin (PRAVACHOL) 40 MG tablet Take 40 mg  by mouth at bedtime.        Marland Kitchen QUEtiapine (SEROQUEL) 100 MG tablet Take 100 mg by mouth 2 (two) times daily.        Marland Kitchen topiramate (TOPAMAX) 100 MG tablet Take 100 mg by mouth 2 (two) times daily.        . traMADol (ULTRAM) 50 MG tablet Take 50 mg by mouth every 6 (six) hours as needed.          Allergies  Allergen Reactions  . Biaxin   . Penicillins     REACTION: Nausea    No family history on file.  BP 130/67  Pulse 71  Temp(Src) 97.3 F (36.3 C) (Oral)  Wt 209 lb (94.802 kg)  SpO2 94%  Review of Systems She has regained 1 lb of what she lost.  Denies sob.    Objective:   Physical Exam VITAL SIGNS:  See vs page GENERAL: no distress LUNGS:  Clear to auscultation     Assessment & Plan:  Leg cramps, uncertain etiology Chronic pain syndrome.  She has upcoming appointment with pmr. Schizophrenia.   This makes the use of narcotics risky, so they will need to be tapered. Osteoporosis.  She is due for a recheck

## 2011-04-16 NOTE — Patient Instructions (Addendum)
When you return, please bring the form you had last week.  We did not have time to do it then, but we are making progress on addressing your health-care needs.  Therefore, i'll try to get it done when you return.   Please come back for a "medicare wellness." appointment in 3 weeks.  That will be after you see dr Merrie Roof.  We will then work to transition from the pain pills to other treatment options she will discuss with you.  This is because the pain medications can interfere with your other medications. Your diabetes is well-controlled, on the metformin.  This is probably due to you weight-loss.  Well recheck the sodium level from time to time.  Let's check the bone-density test.  you will receive a phone call, about a day and time for an appointment.  We'll try to schedule it the same day as your next appointment here.   Take claritin 10 mg daily i have sent a prescription to your pharmacy, for an additional inhale.  We'll check a breathing test when you return.

## 2011-04-19 ENCOUNTER — Telehealth: Payer: Self-pay | Admitting: *Deleted

## 2011-04-19 ENCOUNTER — Ambulatory Visit (INDEPENDENT_AMBULATORY_CARE_PROVIDER_SITE_OTHER)
Admission: RE | Admit: 2011-04-19 | Discharge: 2011-04-19 | Disposition: A | Discharge status: Home / Self Care | Payer: Medicare Other | Source: Ambulatory Visit

## 2011-04-19 DIAGNOSIS — M810 Age-related osteoporosis without current pathological fracture: Secondary | ICD-10-CM

## 2011-04-19 NOTE — Telephone Encounter (Signed)
Order placed per Dr. Everardo All for LVA

## 2011-04-25 ENCOUNTER — Encounter: Payer: Self-pay | Admitting: Endocrinology

## 2011-04-30 ENCOUNTER — Encounter: Payer: Medicare Other | Admitting: Physical Medicine and Rehabilitation

## 2011-05-08 ENCOUNTER — Ambulatory Visit (INDEPENDENT_AMBULATORY_CARE_PROVIDER_SITE_OTHER): Payer: Medicare Other | Admitting: Endocrinology

## 2011-05-08 ENCOUNTER — Ambulatory Visit (INDEPENDENT_AMBULATORY_CARE_PROVIDER_SITE_OTHER)
Admission: RE | Admit: 2011-05-08 | Discharge: 2011-05-08 | Disposition: A | Discharge status: Home / Self Care | Payer: Medicare Other | Source: Ambulatory Visit | Attending: Endocrinology | Admitting: Endocrinology

## 2011-05-08 ENCOUNTER — Encounter: Payer: Self-pay | Admitting: Endocrinology

## 2011-05-08 VITALS — BP 130/84 | HR 71 | Temp 97.5°F | Wt 205.5 lb

## 2011-05-08 DIAGNOSIS — G894 Chronic pain syndrome: Secondary | ICD-10-CM

## 2011-05-08 DIAGNOSIS — R059 Cough, unspecified: Secondary | ICD-10-CM | POA: Insufficient documentation

## 2011-05-08 DIAGNOSIS — Z23 Encounter for immunization: Secondary | ICD-10-CM

## 2011-05-08 DIAGNOSIS — R05 Cough: Secondary | ICD-10-CM

## 2011-05-08 MED ORDER — NICOTINE 21 MG/24HR TD PT24: MEDICATED_PATCH | TRANSDERMAL | Status: DC

## 2011-05-08 NOTE — Patient Instructions (Addendum)
Please continue seeing dr Pamelia Hoit. At next visit here, please tell me the name of your psychiatrist.   i think it would be fine for you to take a medication to prevent losing urine at night. However, please ask the psychiatrist about this, as i don't want to interfere with any of your medications.  Start nicotine patch, 1 per day.   A chest-x-ray is being requested for you today.  please call [phone] to hear your test results.  You will be prompted to enter the 9-digit "MRN" number that appears at the top left of this page, followed by #.  Then you will hear the message.  (update: i left message on phone-tree:  rx as we discussed) (addendum: we still need to to address dexa, spirometry, gyn exam)

## 2011-05-08 NOTE — Progress Notes (Signed)
Subjective:    Patient ID: Natalie Thomas, female    DOB: Nov 10, 1960, 50 y.o.   MRN: [id number]  HPI Representative from [facility] says pt takes norco tid.  Pt reports few years of enuresis. Pt smokes 1-1/2 ppd, and would like to quit.  She reports a few mos of slight dry cough in the chest, but no assoc fever Past Medical History  Diagnosis Date  . COPD (chronic obstructive pulmonary disease)   . Depression   . GERD (gastroesophageal reflux disease)   . Hypertension   . Osteoporosis   . Diabetes mellitus, type 2     No past surgical history on file.  History   Social History  . Marital Status: Divorced    Spouse Name: N/A    Number of Children: N/A  . Years of Education: N/A   Occupational History  . Disabled    Social History Main Topics  . Smoking status: Current Everyday Smoker -- 1.0 packs/day    Types: Cigarettes  . Smokeless tobacco: Not on file  . Alcohol Use: Not on file  . Drug Use: Not on file  . Sexually Active: Not on file   Other Topics Concern  . Not on file   Social History Narrative   single    Current Outpatient Prescriptions on File Prior to Visit  Medication Sig Dispense Refill  . aspirin 81 MG tablet Take 81 mg by mouth daily.        . Calcium Carbonate-Vitamin D (CALTRATE 600+D) 600-400 MG-UNIT per tablet Take 1 tablet by mouth 3 (three) times daily with meals.       . cholecalciferol (VITAMIN D) 1000 UNITS tablet Take 1,000 Units by mouth daily.        . clonazePAM (KLONOPIN) 1 MG tablet Take 1 mg by mouth daily.       Marland Kitchen diltiazem (DILACOR XR) 240 MG 24 hr capsule Take 240 mg by mouth daily.        . divalproex (DEPAKOTE) 250 MG DR tablet Take 250 mg by mouth 2 (two) times daily.        Marland Kitchen docusate sodium (COLACE) 100 MG capsule Take 100 mg by mouth daily.        . DULoxetine (CYMBALTA) 30 MG capsule Take 30 mg by mouth daily.        . Fluticasone-Salmeterol (ADVAIR DISKUS) 250-50 MCG/DOSE AEPB Inhale 1 puff into the lungs every 12  (twelve) hours.        . furosemide (LASIX) 40 MG tablet Take 40 mg by mouth daily.        Marland Kitchen ibuprofen (ADVIL,MOTRIN) 600 MG tablet Take 600 mg by mouth 3 (three) times daily as needed.        . metFORMIN (GLUCOPHAGE) 500 MG tablet Take 500 mg by mouth daily.       Marland Kitchen omeprazole (PRILOSEC) 20 MG capsule Take 20 mg by mouth daily.        Marland Kitchen perphenazine (TRILAFON) 8 MG tablet Take 8 mg by mouth 2 (two) times daily.        . pravastatin (PRAVACHOL) 40 MG tablet Take 40 mg by mouth at bedtime.        Marland Kitchen QUEtiapine (SEROQUEL) 100 MG tablet Take 100 mg by mouth daily.       Marland Kitchen topiramate (TOPAMAX) 100 MG tablet Take 100 mg by mouth 2 (two) times daily.        . traMADol (ULTRAM) 50 MG tablet Take 50 mg by  mouth every 6 (six) hours as needed.        Marland Kitchen albuterol-ipratropium (COMBIVENT) 18-103 MCG/ACT inhaler Inhale 2 puffs into the lungs 4 (four) times daily as needed for wheezing.  1 Inhaler  2    Allergies  Allergen Reactions  . Biaxin   . Penicillins     REACTION: Nausea    No family history on file.  BP 130/84  Pulse 71  Temp(Src) 97.5 F (36.4 C) (Oral)  Wt 205 lb 8 oz (93.214 kg)  SpO2 96%  Review of Systems No change in chronic doe.  She has lost a few more lbs since last ov.      Objective:   Physical Exam VITAL SIGNS:  See vs page GENERAL: no distress LUNGS:  Clear to auscultation PSYCH: Alert and oriented x 3.  Does not appear anxious nor depressed.    (i reviewed cxr report)    Assessment & Plan:  Chronic pain syndrome.  i have reduced norco to 5/325 on med list.  With next refill, i'll reduce the dosage.   Cough, uncertain etiology.  New Enuresis, new.  i could rx this, but the med may interact with psych meds   Subjective:   Patient here for Medicare annual wellness visit and management of other chronic and acute problems.     Risk factors: multiple med probs    Copy Providing Medical Care to Patient: PMR: [facility]iatry: [facility].    Activities of Daily Living: In your present state of health, do you have any difficulty performing the following activities?:   Pt lives at [facility], where meds and meals are brought to her.   Preparing food and eating?: yes Bathing yourself: No  Getting dressed: No  Using the toilet: No  Moving around from place to place: No  In the past year have you fallen or had a near fall?:  Occasionally, but no injury.      Home Safety: Has smoke detector and wears seat belts. No firearms. No excess sun exposure.  Diet and Exercise  Current exercise habits:  Pt says good Dietary issues discussed: pt reports a healthy diet   Depression Screen  Q1: Over the past two weeks, have you felt down, depressed or hopeless?  Sees psychiatrist Q2: Over the past two weeks, have you felt little interest or pleasure in doing things? Sees psychiatrist  The following portions of the patient's history were reviewed and updated as appropriate: allergies, current medications, past family history, past medical history, past social history, past surgical history and problem list.  Past Medical History  Diagnosis Date  . COPD (chronic obstructive pulmonary disease)   . Depression   . GERD (gastroesophageal reflux disease)   . Hypertension   . Osteoporosis   . Diabetes mellitus, type 2     No past surgical history on file.  History   Social History  . Marital Status: Divorced    Spouse Name: N/A    Number of Children: N/A  . Years of Education: N/A   Occupational History  . Disabled    Social History Main Topics  . Smoking status: Current Everyday Smoker -- 1.0 packs/day    Types: Cigarettes  . Smokeless tobacco: Not on file  . Alcohol Use: Not on file  . Drug Use: Not on file  . Sexually Active: Not on file   Other Topics Concern  . Not on file   Social History Narrative  single    Current Outpatient Prescriptions on File Prior to Visit  Medication Sig Dispense Refill    . aspirin 81 MG tablet Take 81 mg by mouth daily.        . Calcium Carbonate-Vitamin D (CALTRATE 600+D) 600-400 MG-UNIT per tablet Take 1 tablet by mouth 3 (three) times daily with meals.       . cholecalciferol (VITAMIN D) 1000 UNITS tablet Take 1,000 Units by mouth daily.        . clonazePAM (KLONOPIN) 1 MG tablet Take 1 mg by mouth daily.       Marland Kitchen diltiazem (DILACOR XR) 240 MG 24 hr capsule Take 240 mg by mouth daily.        . divalproex (DEPAKOTE) 250 MG DR tablet Take 250 mg by mouth 2 (two) times daily.        Marland Kitchen docusate sodium (COLACE) 100 MG capsule Take 100 mg by mouth daily.        . DULoxetine (CYMBALTA) 30 MG capsule Take 30 mg by mouth daily.        . Fluticasone-Salmeterol (ADVAIR DISKUS) 250-50 MCG/DOSE AEPB Inhale 1 puff into the lungs every 12 (twelve) hours.        . furosemide (LASIX) 40 MG tablet Take 40 mg by mouth daily.        Marland Kitchen ibuprofen (ADVIL,MOTRIN) 600 MG tablet Take 600 mg by mouth 3 (three) times daily as needed.        . metFORMIN (GLUCOPHAGE) 500 MG tablet Take 500 mg by mouth daily.       Marland Kitchen omeprazole (PRILOSEC) 20 MG capsule Take 20 mg by mouth daily.        Marland Kitchen perphenazine (TRILAFON) 8 MG tablet Take 8 mg by mouth 2 (two) times daily.        . pravastatin (PRAVACHOL) 40 MG tablet Take 40 mg by mouth at bedtime.        Marland Kitchen QUEtiapine (SEROQUEL) 100 MG tablet Take 100 mg by mouth daily.       Marland Kitchen topiramate (TOPAMAX) 100 MG tablet Take 100 mg by mouth 2 (two) times daily.        . traMADol (ULTRAM) 50 MG tablet Take 50 mg by mouth every 6 (six) hours as needed.        Marland Kitchen albuterol-ipratropium (COMBIVENT) 18-103 MCG/ACT inhaler Inhale 2 puffs into the lungs 4 (four) times daily as needed for wheezing.  1 Inhaler  2    Allergies  Allergen Reactions  . Biaxin   . Penicillins     REACTION: Nausea    No family history on file.  BP 130/84  Pulse 71  Temp(Src) 97.5 F (36.4 C) (Oral)  Wt 205 lb 8 oz (93.214 kg)  SpO2 96%   Review of Systems  Denies hearing  loss, and visual loss Objective:   Vision:  Sees opthalmologist Hearing: grossly normal Body mass index:  See vs page Msk: pt easily and quickly performs "get-up-and-go" from a sitting position Cognitive Impairment Assessment: cognition, memory and judgment appear normal.  remembers 3/3 at 5 minutes.  excellent recall.  can easily read and write a sentence.  alert and oriented x 3   Assessment:   Medicare wellness utd on preventive parameters    Plan:   During the course of the visit the patient was educated and counseled about appropriate screening and preventive services including:        Fall prevention   Screening mammography  Bone densitometry screening  Diabetes screening  Nutrition counseling   Vaccines / LABS Zostavax / Pnemonccoal Vaccine  today  PSA  Patient Instructions (the written plan) was given to the patient.

## 2011-05-28 ENCOUNTER — Encounter: Payer: Medicare Other | Admitting: Advanced Practice Midwife

## 2011-05-30 ENCOUNTER — Encounter
Payer: Medicare Other | Attending: Physical Medicine and Rehabilitation | Admitting: Physical Medicine and Rehabilitation

## 2011-05-31 ENCOUNTER — Emergency Department (HOSPITAL_COMMUNITY): Payer: Medicare Other

## 2011-05-31 ENCOUNTER — Emergency Department (HOSPITAL_COMMUNITY)
Admission: EM | Admit: 2011-05-31 | Discharge: 2011-05-31 | Disposition: A | Discharge status: Skilled Nursing Facility | Payer: Medicare Other | Attending: Emergency Medicine | Admitting: Emergency Medicine

## 2011-05-31 DIAGNOSIS — M25529 Pain in unspecified elbow: Secondary | ICD-10-CM | POA: Insufficient documentation

## 2011-05-31 DIAGNOSIS — F329 Major depressive disorder, single episode, unspecified: Secondary | ICD-10-CM | POA: Insufficient documentation

## 2011-05-31 DIAGNOSIS — T148XXA Other injury of unspecified body region, initial encounter: Secondary | ICD-10-CM

## 2011-05-31 DIAGNOSIS — W19XXXA Unspecified fall, initial encounter: Secondary | ICD-10-CM

## 2011-05-31 DIAGNOSIS — E119 Type 2 diabetes mellitus without complications: Secondary | ICD-10-CM | POA: Insufficient documentation

## 2011-05-31 DIAGNOSIS — J4489 Other specified chronic obstructive pulmonary disease: Secondary | ICD-10-CM | POA: Insufficient documentation

## 2011-05-31 DIAGNOSIS — R079 Chest pain, unspecified: Secondary | ICD-10-CM | POA: Insufficient documentation

## 2011-05-31 DIAGNOSIS — Z7982 Long term (current) use of aspirin: Secondary | ICD-10-CM | POA: Insufficient documentation

## 2011-05-31 DIAGNOSIS — M549 Dorsalgia, unspecified: Secondary | ICD-10-CM | POA: Insufficient documentation

## 2011-05-31 DIAGNOSIS — K219 Gastro-esophageal reflux disease without esophagitis: Secondary | ICD-10-CM | POA: Insufficient documentation

## 2011-05-31 DIAGNOSIS — F3289 Other specified depressive episodes: Secondary | ICD-10-CM | POA: Insufficient documentation

## 2011-05-31 DIAGNOSIS — J449 Chronic obstructive pulmonary disease, unspecified: Secondary | ICD-10-CM | POA: Insufficient documentation

## 2011-05-31 DIAGNOSIS — M810 Age-related osteoporosis without current pathological fracture: Secondary | ICD-10-CM | POA: Insufficient documentation

## 2011-05-31 DIAGNOSIS — I10 Essential (primary) hypertension: Secondary | ICD-10-CM | POA: Insufficient documentation

## 2011-05-31 DIAGNOSIS — IMO0002 Reserved for concepts with insufficient information to code with codable children: Secondary | ICD-10-CM | POA: Insufficient documentation

## 2011-05-31 DIAGNOSIS — M542 Cervicalgia: Secondary | ICD-10-CM | POA: Insufficient documentation

## 2011-05-31 DIAGNOSIS — Z79899 Other long term (current) drug therapy: Secondary | ICD-10-CM | POA: Insufficient documentation

## 2011-05-31 NOTE — ED Notes (Signed)
Pt reports no headache, no blurred vision or dizziness at this time.

## 2011-05-31 NOTE — ED Provider Notes (Signed)
  I performed a history and physical examination of Natalie Thomas and discussed her management with Dr. Leary Roca.  I agree with the history, physical, assessment, and plan of care, with the following exceptions: None  I was present for the following procedures: None Time Spent in Critical Care of the patient: None  No distress, plain films are negative, although suboptimal.  However given low suspicion, no sig concern for spinal injury or fracture.  C collar can be removed and pt can follow up with PCP.      Jara Feider Y.    Gavin Pound. Oletta Lamas, MD 05/31/11 1931
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
## 2011-06-01 ENCOUNTER — Encounter (HOSPITAL_COMMUNITY): Payer: Self-pay | Admitting: Emergency Medicine

## 2011-06-04 NOTE — ED Provider Notes (Signed)
Please see my addendum note.  Woods Gangemi Y. Cantrell Martus, MD 06/04/11 1142 

## 2011-06-07 ENCOUNTER — Ambulatory Visit: Payer: Medicare Other | Admitting: Endocrinology

## 2011-06-07 DIAGNOSIS — Z0289 Encounter for other administrative examinations: Secondary | ICD-10-CM

## 2011-06-08 ENCOUNTER — Ambulatory Visit (INDEPENDENT_AMBULATORY_CARE_PROVIDER_SITE_OTHER): Payer: Medicare Other | Admitting: Endocrinology

## 2011-06-08 VITALS — BP 132/78 | HR 89 | Temp 99.6°F | Wt 197.4 lb

## 2011-06-08 DIAGNOSIS — G894 Chronic pain syndrome: Secondary | ICD-10-CM

## 2011-06-08 DIAGNOSIS — I10 Essential (primary) hypertension: Secondary | ICD-10-CM

## 2011-06-08 DIAGNOSIS — F259 Schizoaffective disorder, unspecified: Secondary | ICD-10-CM

## 2011-06-08 NOTE — Patient Instructions (Addendum)
(  addendum: we still need to to address dexa, spirometry, gyn exam). Please bring pt to [facility] center now.   Please come back for a follow-up appointment for 1 month.  Please make an appointment.   Refer to a psychiatrist.  you will receive a phone call, about a day and time for an appointment.
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
## 2011-06-16 ENCOUNTER — Emergency Department (HOSPITAL_COMMUNITY)
Admission: EM | Admit: 2011-06-16 | Discharge: 2011-06-17 | Disposition: A | Discharge status: Home / Self Care | Payer: Medicare Other | Attending: Emergency Medicine | Admitting: Emergency Medicine

## 2011-06-16 ENCOUNTER — Encounter (HOSPITAL_COMMUNITY): Payer: Self-pay | Admitting: Emergency Medicine

## 2011-06-16 DIAGNOSIS — F17200 Nicotine dependence, unspecified, uncomplicated: Secondary | ICD-10-CM | POA: Insufficient documentation

## 2011-06-16 DIAGNOSIS — F29 Unspecified psychosis not due to a substance or known physiological condition: Secondary | ICD-10-CM

## 2011-06-16 DIAGNOSIS — J449 Chronic obstructive pulmonary disease, unspecified: Secondary | ICD-10-CM | POA: Insufficient documentation

## 2011-06-16 DIAGNOSIS — M810 Age-related osteoporosis without current pathological fracture: Secondary | ICD-10-CM | POA: Insufficient documentation

## 2011-06-16 DIAGNOSIS — K219 Gastro-esophageal reflux disease without esophagitis: Secondary | ICD-10-CM | POA: Insufficient documentation

## 2011-06-16 DIAGNOSIS — J4489 Other specified chronic obstructive pulmonary disease: Secondary | ICD-10-CM | POA: Insufficient documentation

## 2011-06-16 DIAGNOSIS — I10 Essential (primary) hypertension: Secondary | ICD-10-CM | POA: Insufficient documentation

## 2011-06-16 DIAGNOSIS — E119 Type 2 diabetes mellitus without complications: Secondary | ICD-10-CM | POA: Insufficient documentation

## 2011-06-16 LAB — PREGNANCY, URINE: Preg Test, Ur: NEGATIVE

## 2011-06-16 LAB — URINALYSIS, ROUTINE W REFLEX MICROSCOPIC
Ketones, ur: NEGATIVE mg/dL
Leukocytes, UA: NEGATIVE
Nitrite: NEGATIVE
Protein, ur: NEGATIVE mg/dL

## 2011-06-16 LAB — CBC
MCH: 32 pg (ref 26.0–34.0)
MCHC: 35.7 g/dL (ref 30.0–36.0)
Platelets: 191 10*3/uL (ref 150–400)
RBC: 4.63 MIL/uL (ref 3.87–5.11)

## 2011-06-16 LAB — RAPID URINE DRUG SCREEN, HOSP PERFORMED
Cocaine: NOT DETECTED
Opiates: NOT DETECTED
Tetrahydrocannabinol: NOT DETECTED

## 2011-06-16 LAB — BASIC METABOLIC PANEL
Calcium: 9.2 mg/dL (ref 8.4–10.5)
GFR calc Af Amer: >90 mL/min (ref >90)
GFR calc non Af Amer: >90 mL/min (ref >90)
Glucose, Bld: 115 mg/dL — ABNORMAL HIGH (ref 70–99)
Sodium: 128 mEq/L — ABNORMAL LOW (ref 135–145)

## 2011-06-16 LAB — DIFFERENTIAL
Basophils Relative: 0 % (ref 0–1)
Eosinophils Absolute: 0.1 10*3/uL (ref 0.0–0.7)
Lymphs Abs: 2.4 10*3/uL (ref 0.7–4.0)
Neutrophils Relative %: 70 % (ref 43–77)

## 2011-06-16 LAB — ETHANOL: Alcohol, Ethyl (B): <11 mg/dL (ref 0–11)

## 2011-06-16 MED ORDER — ASPIRIN EC 81 MG PO TBEC
81.0000 mg | DELAYED_RELEASE_TABLET | Freq: Every day | ORAL | Status: DC
  Administered 2011-06-16 – 2011-06-17 (×2): 81 mg via ORAL
  Filled 2011-06-16 (×3): qty 1

## 2011-06-16 MED ORDER — IBUPROFEN 600 MG PO TABS
600.0000 mg | ORAL_TABLET | Freq: Three times a day (TID) | ORAL | Status: DC | PRN
  Administered 2011-06-17: 600 mg via ORAL
  Filled 2011-06-16: qty 1

## 2011-06-16 MED ORDER — ACETAMINOPHEN 325 MG PO TABS: 650.0000 mg | ORAL_TABLET | ORAL | Status: DC | PRN

## 2011-06-16 MED ORDER — FLUTICASONE-SALMETEROL 250-50 MCG/DOSE IN AEPB
1.0000 | INHALATION_SPRAY | Freq: Two times a day (BID) | RESPIRATORY_TRACT | Status: DC
  Administered 2011-06-16 – 2011-06-17 (×2): 1 via RESPIRATORY_TRACT
  Filled 2011-06-16: qty 14

## 2011-06-16 MED ORDER — ONDANSETRON HCL 4 MG PO TABS: 4.0000 mg | ORAL_TABLET | Freq: Three times a day (TID) | ORAL | Status: DC | PRN

## 2011-06-16 MED ORDER — METFORMIN HCL 500 MG PO TABS
500.0000 mg | ORAL_TABLET | Freq: Every day | ORAL | Status: DC
  Administered 2011-06-17: 500 mg via ORAL
  Filled 2011-06-16 (×2): qty 1

## 2011-06-16 MED ORDER — FUROSEMIDE 40 MG PO TABS
40.0000 mg | ORAL_TABLET | Freq: Every day | ORAL | Status: DC
  Administered 2011-06-16 – 2011-06-17 (×2): 40 mg via ORAL
  Filled 2011-06-16 (×3): qty 1

## 2011-06-16 MED ORDER — POTASSIUM CHLORIDE CRYS ER 20 MEQ PO TBCR
20.0000 meq | EXTENDED_RELEASE_TABLET | Freq: Every day | ORAL | Status: DC
  Administered 2011-06-16 – 2011-06-17 (×2): 20 meq via ORAL
  Filled 2011-06-16 (×2): qty 1

## 2011-06-16 MED ORDER — DIVALPROEX SODIUM ER 250 MG PO TB24
250.0000 mg | ORAL_TABLET | Freq: Two times a day (BID) | ORAL | Status: DC
  Administered 2011-06-16 – 2011-06-17 (×3): 250 mg via ORAL
  Filled 2011-06-16 (×6): qty 1

## 2011-06-16 MED ORDER — DOCUSATE SODIUM 100 MG PO CAPS
100.0000 mg | ORAL_CAPSULE | Freq: Every day | ORAL | Status: DC
  Administered 2011-06-16 – 2011-06-17 (×2): 100 mg via ORAL
  Filled 2011-06-16 (×3): qty 1

## 2011-06-16 MED ORDER — ROSUVASTATIN CALCIUM 5 MG PO TABS
5.0000 mg | ORAL_TABLET | Freq: Every day | ORAL | Status: DC
  Administered 2011-06-16: 5 mg via ORAL
  Filled 2011-06-16 (×3): qty 1

## 2011-06-16 MED ORDER — HYDROCODONE-ACETAMINOPHEN 7.5-325 MG PO TABS
1.0000 | ORAL_TABLET | Freq: Four times a day (QID) | ORAL | Status: DC | PRN
  Filled 2011-06-16: qty 1

## 2011-06-16 MED ORDER — LORATADINE 10 MG PO TABS
10.0000 mg | ORAL_TABLET | Freq: Every day | ORAL | Status: DC
  Administered 2011-06-16: 10 mg via ORAL
  Filled 2011-06-16: qty 1

## 2011-06-16 MED ORDER — TOPIRAMATE 25 MG PO TABS
100.0000 mg | ORAL_TABLET | Freq: Two times a day (BID) | ORAL | Status: DC
  Administered 2011-06-16: 100 mg via ORAL
  Filled 2011-06-16: qty 4

## 2011-06-16 MED ORDER — QUETIAPINE FUMARATE 100 MG PO TABS
100.0000 mg | ORAL_TABLET | Freq: Two times a day (BID) | ORAL | Status: DC
  Administered 2011-06-16: 100 mg via ORAL
  Filled 2011-06-16: qty 1

## 2011-06-16 MED ORDER — DULOXETINE HCL 30 MG PO CPEP
30.0000 mg | ORAL_CAPSULE | Freq: Every day | ORAL | Status: DC
  Administered 2011-06-16 – 2011-06-17 (×2): 30 mg via ORAL
  Filled 2011-06-16 (×3): qty 1

## 2011-06-16 MED ORDER — PANTOPRAZOLE SODIUM 40 MG PO TBEC
40.0000 mg | DELAYED_RELEASE_TABLET | Freq: Every day | ORAL | Status: DC
  Administered 2011-06-16 – 2011-06-17 (×2): 40 mg via ORAL
  Filled 2011-06-16 (×2): qty 1

## 2011-06-16 MED ORDER — PERPHENAZINE 8 MG PO TABS
8.0000 mg | ORAL_TABLET | Freq: Two times a day (BID) | ORAL | Status: DC
  Administered 2011-06-16: 8 mg via ORAL
  Filled 2011-06-16 (×2): qty 1

## 2011-06-16 MED ORDER — PERPHENAZINE 8 MG PO TABS
8.0000 mg | ORAL_TABLET | Freq: Every day | ORAL | Status: DC
  Filled 2011-06-16 (×2): qty 1

## 2011-06-16 MED ORDER — IPRATROPIUM-ALBUTEROL 18-103 MCG/ACT IN AERO: 2.0000 | INHALATION_SPRAY | Freq: Four times a day (QID) | RESPIRATORY_TRACT | Status: DC | PRN

## 2011-06-16 MED ORDER — SIMVASTATIN 40 MG PO TABS: 40.0000 mg | ORAL_TABLET | Freq: Every day | ORAL | Status: DC

## 2011-06-16 MED ORDER — ALUM & MAG HYDROXIDE-SIMETH 200-200-20 MG/5ML PO SUSP: 30.0000 mL | ORAL | Status: DC | PRN

## 2011-06-16 MED ORDER — LORAZEPAM 1 MG PO TABS: 1.0000 mg | ORAL_TABLET | Freq: Three times a day (TID) | ORAL | Status: DC | PRN

## 2011-06-16 MED ORDER — ZOLPIDEM TARTRATE 5 MG PO TABS: 5.0000 mg | ORAL_TABLET | Freq: Every evening | ORAL | Status: DC | PRN

## 2011-06-16 MED ORDER — DILTIAZEM HCL ER 240 MG PO CP24
240.0000 mg | ORAL_CAPSULE | Freq: Every day | ORAL | Status: DC
  Administered 2011-06-16 – 2011-06-17 (×2): 240 mg via ORAL
  Filled 2011-06-16 (×3): qty 1

## 2011-06-16 MED ORDER — CLONAZEPAM 1 MG PO TABS
1.0000 mg | ORAL_TABLET | Freq: Every day | ORAL | Status: DC
Start: 2011-06-16 — End: 2011-06-17
  Administered 2011-06-17: 1 mg via ORAL
  Filled 2011-06-16: qty 1

## 2011-06-16 MED ORDER — NICOTINE 21 MG/24HR TD PT24
21.0000 mg | MEDICATED_PATCH | Freq: Every day | TRANSDERMAL | Status: DC
  Administered 2011-06-16 – 2011-06-17 (×2): 21 mg via TRANSDERMAL
  Filled 2011-06-16 (×2): qty 1

## 2011-06-16 MED ORDER — IPRATROPIUM-ALBUTEROL 18-103 MCG/ACT IN AERO
2.0000 | INHALATION_SPRAY | Freq: Four times a day (QID) | RESPIRATORY_TRACT | Status: DC | PRN
  Administered 2011-06-16: 2 via RESPIRATORY_TRACT
  Filled 2011-06-16: qty 14.7

## 2011-06-16 NOTE — ED Provider Notes (Signed)
Medical screening examination/treatment/procedure(s) were conducted as a shared visit with non-physician practitioner(s) and myself.  I personally evaluated the patient during the encounter  6:05 AM Patient awake, alert, cooperative.    Hanley Seamen, MD 06/16/11 [phone]

## 2011-06-16 NOTE — ED Notes (Signed)
This Clinical research associate verified w/  Phleta at [facility] that the medication listed in the [facility] crisis assessment were reviewed by her  w/ the counselor and  that they are the current medications that the pt is on.
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
## 2011-06-16 NOTE — ED Notes (Signed)
Report received from night RN. First contact with patient. Pt is resting in bed, is alert, and oriented. Requested for coffee and coffee offered to patient. Pt denied any hallucination right now

## 2011-06-16 NOTE — ED Notes (Signed)
2 pt belongings bags locked in activity room

## 2011-06-16 NOTE — ED Notes (Signed)
Pharmacy tech into see 

## 2011-06-16 NOTE — ED Notes (Signed)
Pt declined at [facility] per dr Lynelle Doctor

## 2011-06-16 NOTE — ED Notes (Addendum)
Abrasion to lt elbow from previous admission closed. Small (<1/4 cm scab noted), no  Redness,drainage,swelling noted
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
## 2011-06-16 NOTE — ED Provider Notes (Signed)
Medical screening examination/treatment/procedure(s) were performed by non-physician practitioner and as supervising physician I was immediately available for consultation/collaboration.   Hanley Seamen, MD 06/16/11 [phone]
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
## 2011-06-16 NOTE — ED Notes (Signed)
Pt denies any SI/HI ideations at this time, however wasn't to be seen and admitted to the [facility]. Pt also states that she has a chronic back pain, lower back, 7/10, and that her depression and anxiety is worsening.

## 2011-06-16 NOTE — ED Notes (Signed)
Pt placed in blue scrubs, 2 bags of belongings, wanded by security. Pt moved to psy ed

## 2011-06-17 LAB — GLUCOSE, CAPILLARY
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
## 2011-06-17 NOTE — ED Provider Notes (Addendum)
Patient ambulatory in the psych ward. She does not appear to be in any distress.  She is waiting for placement back at her group home, she is waiting for the director to give approval.  Devoria Albe, MD, Franz Dell, MD 06/17/11 1726  Ward Givens, MD 06/17/11 1728

## 2011-06-17 NOTE — ED Notes (Signed)
telepsych in progress 

## 2011-06-17 NOTE — ED Notes (Signed)
Natalie Thomas at [facility] contacted and is aware of telespych results

## 2011-06-17 NOTE — ED Notes (Signed)
Up to the desk on the phone 

## 2011-06-17 NOTE — ED Notes (Signed)
Up to the bathroom, ambulatory w/o difficulty.  Pt is aware that the director of the home must approve her retuning and that they are trying to reach her.

## 2011-06-17 NOTE — ED Notes (Signed)
Dr Lynelle Doctor over to talk w/ pt

## 2011-06-17 NOTE — ED Notes (Signed)
Writer notified by pt's RN telepsych results recommend discharge. Writer will contact pt's care home to coordinate transportation upon discharge if available.

## 2011-06-17 NOTE — ED Provider Notes (Signed)
Pt evaluated by telepsych who feels that pt can be discharged home.  Will follow up with [facility] alternatives.  Denies SI/HI.  Behavior appropriate  Rolan Bucco, MD 06/17/11 1416

## 2011-06-17 NOTE — ED Notes (Signed)
Up to the desk trying to find a ride home 

## 2011-06-17 NOTE — ED Notes (Signed)
telepsych info faxed 

## 2011-06-17 NOTE — ED Notes (Signed)
phleta at rest up up dated and is aware that pt will be returning by cab.  She is also aware that the pt will have a list of the medications given here and when the last dose was given-encouraged to follow up this week w/ the pt's private drs.

## 2011-06-17 NOTE — ED Notes (Signed)
Greg CSW in w/ pt 

## 2011-06-17 NOTE — Progress Notes (Signed)
Natalie Thomas from [facility] called at 1620 and reported that her supervisor, Natalie Thomas, agreed to have pt return to program.  Pt will need to be transported to program from hospital and hospital will arrange.  [facility] address is:  [address]   [place]    [place]   [address]   Per staff member, Andee Poles.  Phone number is [phone].

## 2011-06-17 NOTE — ED Notes (Signed)
Up to the bathroom, ambulatory w/o difficulty 
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
## 2011-06-17 NOTE — ED Notes (Signed)
Additional requested information faxed to [facility] in order to make the determination of disposition.

## 2011-06-17 NOTE — ED Notes (Signed)
Pt unable to be transported back to care home by facility and has no other way to get there. Pt attempted to get transportation by family several times but was unsuccessful. CSW provided pt's RN with taxi voucher to provide assistance with transportation back to [facility]: [address] [place], [place] [address]. No other concerns verbalized at this time.

## 2011-06-17 NOTE — ED Notes (Signed)
Tammy Sours CSW into talk w/ pt
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
## 2011-07-02 ENCOUNTER — Encounter (HOSPITAL_COMMUNITY): Payer: Self-pay

## 2011-07-02 ENCOUNTER — Emergency Department (HOSPITAL_COMMUNITY)
Admission: EM | Admit: 2011-07-02 | Discharge: 2011-07-04 | Disposition: A | Discharge status: Other Acute Inpatient Hospital | Payer: Medicare Other | Source: Home / Self Care | Attending: *Deleted | Admitting: *Deleted

## 2011-07-02 DIAGNOSIS — M810 Age-related osteoporosis without current pathological fracture: Secondary | ICD-10-CM | POA: Insufficient documentation

## 2011-07-02 DIAGNOSIS — Z046 Encounter for general psychiatric examination, requested by authority: Secondary | ICD-10-CM | POA: Insufficient documentation

## 2011-07-02 DIAGNOSIS — F29 Unspecified psychosis not due to a substance or known physiological condition: Secondary | ICD-10-CM | POA: Insufficient documentation

## 2011-07-02 DIAGNOSIS — I10 Essential (primary) hypertension: Secondary | ICD-10-CM | POA: Insufficient documentation

## 2011-07-02 DIAGNOSIS — Z79899 Other long term (current) drug therapy: Secondary | ICD-10-CM | POA: Insufficient documentation

## 2011-07-02 DIAGNOSIS — E119 Type 2 diabetes mellitus without complications: Secondary | ICD-10-CM | POA: Insufficient documentation

## 2011-07-02 DIAGNOSIS — F17200 Nicotine dependence, unspecified, uncomplicated: Secondary | ICD-10-CM | POA: Insufficient documentation

## 2011-07-02 DIAGNOSIS — IMO0002 Reserved for concepts with insufficient information to code with codable children: Secondary | ICD-10-CM | POA: Insufficient documentation

## 2011-07-02 LAB — BASIC METABOLIC PANEL
CO2: 23 mEq/L (ref 19–32)
Calcium: 9.6 mg/dL (ref 8.4–10.5)
Chloride: 97 mEq/L (ref 96–112)
Glucose, Bld: 87 mg/dL (ref 70–99)
Potassium: 4 mEq/L (ref 3.5–5.1)
Sodium: 132 mEq/L — ABNORMAL LOW (ref 135–145)

## 2011-07-02 LAB — DIFFERENTIAL
Basophils Absolute: 0 10*3/uL (ref 0.0–0.1)
Eosinophils Relative: 2 % (ref 0–5)
Lymphocytes Relative: 30 % (ref 12–46)
Lymphs Abs: 3.1 10*3/uL (ref 0.7–4.0)
Neutro Abs: 6.3 10*3/uL (ref 1.7–7.7)

## 2011-07-02 LAB — RAPID URINE DRUG SCREEN, HOSP PERFORMED
Amphetamines: NOT DETECTED
Benzodiazepines: NOT DETECTED
Cocaine: NOT DETECTED
Opiates: NOT DETECTED
Tetrahydrocannabinol: NOT DETECTED

## 2011-07-02 LAB — CBC
HCT: 41.5 % (ref 36.0–46.0)
MCV: 92.2 fL (ref 78.0–100.0)
Platelets: 186 10*3/uL (ref 150–400)
RBC: 4.5 MIL/uL (ref 3.87–5.11)
WBC: 10 10*3/uL (ref 4.0–10.5)

## 2011-07-02 LAB — ETHANOL: Alcohol, Ethyl (B): <11 mg/dL (ref 0–11)

## 2011-07-02 MED ORDER — ONDANSETRON HCL 4 MG PO TABS: 4.0000 mg | ORAL_TABLET | Freq: Three times a day (TID) | ORAL | Status: DC | PRN

## 2011-07-02 MED ORDER — NICOTINE 21 MG/24HR TD PT24
21.0000 mg | MEDICATED_PATCH | Freq: Every day | TRANSDERMAL | Status: DC
  Administered 2011-07-03 – 2011-07-04 (×2): 21 mg via TRANSDERMAL
  Filled 2011-07-02 (×2): qty 1

## 2011-07-02 MED ORDER — LORAZEPAM 1 MG PO TABS
1.0000 mg | ORAL_TABLET | Freq: Three times a day (TID) | ORAL | Status: DC | PRN
  Administered 2011-07-02 – 2011-07-04 (×3): 1 mg via ORAL
  Filled 2011-07-02 (×3): qty 1

## 2011-07-02 MED ORDER — ALUM & MAG HYDROXIDE-SIMETH 200-200-20 MG/5ML PO SUSP: 30.0000 mL | ORAL | Status: DC | PRN

## 2011-07-02 MED ORDER — ACETAMINOPHEN 325 MG PO TABS: 650.0000 mg | ORAL_TABLET | ORAL | Status: DC | PRN

## 2011-07-02 MED ORDER — IBUPROFEN 600 MG PO TABS
600.0000 mg | ORAL_TABLET | Freq: Three times a day (TID) | ORAL | Status: DC | PRN
  Administered 2011-07-03 – 2011-07-04 (×2): 600 mg via ORAL
  Filled 2011-07-02 (×2): qty 1

## 2011-07-02 NOTE — ED Notes (Signed)
Family at bedside. 

## 2011-07-02 NOTE — ED Notes (Signed)
Pt information sent to [facility] for review. Oncoming staff to follow up regarding disposition.
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
## 2011-07-02 NOTE — ED Notes (Signed)
Pt here with sherriffs department, IVC per her brother, papers state that she's not taking her medications and talking about things that are not real

## 2011-07-02 NOTE — ED Notes (Signed)
Pt given a sandwich and drink.
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
## 2011-07-02 NOTE — ED Notes (Signed)
CSW met with pt to complete assessment, however pt was unable to wake. CSW will attempt to assess pt at a later time.

## 2011-07-03 MED ORDER — SIMVASTATIN 40 MG PO TABS: 40.0000 mg | ORAL_TABLET | Freq: Every day | ORAL | Status: DC

## 2011-07-03 MED ORDER — SENNA 8.6 MG PO TABS
2.0000 | ORAL_TABLET | Freq: Every day | ORAL | Status: DC
  Administered 2011-07-03: 17.2 mg via ORAL
  Filled 2011-07-03 (×3): qty 2

## 2011-07-03 MED ORDER — CALCIUM CARBONATE-VITAMIN D 600-400 MG-UNIT PO TABS: 1.0000 | ORAL_TABLET | Freq: Two times a day (BID) | ORAL | Status: DC

## 2011-07-03 MED ORDER — ROSUVASTATIN CALCIUM 10 MG PO TABS
10.0000 mg | ORAL_TABLET | Freq: Every day | ORAL | Status: DC
  Administered 2011-07-03: 10 mg via ORAL
  Filled 2011-07-03 (×2): qty 1

## 2011-07-03 MED ORDER — VITAMIN D3 25 MCG (1000 UNIT) PO TABS
1000.0000 [IU] | ORAL_TABLET | Freq: Every day | ORAL | Status: DC
  Administered 2011-07-03 – 2011-07-04 (×2): 1000 [IU] via ORAL
  Filled 2011-07-03 (×3): qty 1

## 2011-07-03 MED ORDER — DIVALPROEX SODIUM 250 MG PO DR TAB
DELAYED_RELEASE_TABLET | ORAL | Status: AC
  Filled 2011-07-03: qty 1

## 2011-07-03 MED ORDER — PANTOPRAZOLE SODIUM 40 MG PO TBEC
DELAYED_RELEASE_TABLET | ORAL | Status: AC
  Filled 2011-07-03: qty 1

## 2011-07-03 MED ORDER — TOPIRAMATE 100 MG PO TABS
100.0000 mg | ORAL_TABLET | Freq: Two times a day (BID) | ORAL | Status: DC
  Administered 2011-07-03 – 2011-07-04 (×3): 100 mg via ORAL
  Filled 2011-07-03: qty 1
  Filled 2011-07-03: qty 4
  Filled 2011-07-03 (×2): qty 1

## 2011-07-03 MED ORDER — DULOXETINE HCL 30 MG PO CPEP
30.0000 mg | ORAL_CAPSULE | Freq: Every day | ORAL | Status: DC
  Administered 2011-07-03 – 2011-07-04 (×2): 30 mg via ORAL
  Filled 2011-07-03 (×2): qty 1

## 2011-07-03 MED ORDER — DILTIAZEM HCL ER 240 MG PO CP24
240.0000 mg | ORAL_CAPSULE | Freq: Every day | ORAL | Status: DC
  Administered 2011-07-03 – 2011-07-04 (×2): 240 mg via ORAL
  Filled 2011-07-03 (×2): qty 1

## 2011-07-03 MED ORDER — QUETIAPINE FUMARATE 100 MG PO TABS
100.0000 mg | ORAL_TABLET | ORAL | Status: DC
  Administered 2011-07-03 – 2011-07-04 (×2): 100 mg via ORAL
  Filled 2011-07-03 (×2): qty 1

## 2011-07-03 MED ORDER — POTASSIUM CHLORIDE CRYS ER 20 MEQ PO TBCR
EXTENDED_RELEASE_TABLET | ORAL | Status: AC
  Filled 2011-07-03: qty 1

## 2011-07-03 MED ORDER — ASPIRIN 81 MG PO CHEW
CHEWABLE_TABLET | ORAL | Status: AC
  Filled 2011-07-03: qty 1

## 2011-07-03 MED ORDER — TRAMADOL HCL 50 MG PO TABS: 50.0000 mg | ORAL_TABLET | Freq: Four times a day (QID) | ORAL | Status: DC | PRN

## 2011-07-03 MED ORDER — PERPHENAZINE 4 MG PO TABS
8.0000 mg | ORAL_TABLET | Freq: Two times a day (BID) | ORAL | Status: DC
  Administered 2011-07-03 – 2011-07-04 (×3): 8 mg via ORAL
  Filled 2011-07-03 (×5): qty 2

## 2011-07-03 MED ORDER — CALCIUM CARBONATE-VITAMIN D 500-200 MG-UNIT PO TABS
1.0000 | ORAL_TABLET | Freq: Two times a day (BID) | ORAL | Status: DC
Start: 2011-07-03 — End: 2011-07-04
  Administered 2011-07-03 – 2011-07-04 (×3): 1 via ORAL
  Filled 2011-07-03 (×4): qty 1

## 2011-07-03 MED ORDER — FUROSEMIDE 40 MG PO TABS
40.0000 mg | ORAL_TABLET | Freq: Every day | ORAL | Status: DC
  Administered 2011-07-03 – 2011-07-04 (×2): 40 mg via ORAL
  Filled 2011-07-03 (×3): qty 1

## 2011-07-03 MED ORDER — METFORMIN HCL 500 MG PO TABS
500.0000 mg | ORAL_TABLET | Freq: Two times a day (BID) | ORAL | Status: DC
  Administered 2011-07-03 – 2011-07-04 (×2): 500 mg via ORAL
  Filled 2011-07-03 (×4): qty 1

## 2011-07-03 MED ORDER — DOCUSATE SODIUM 100 MG PO CAPS
100.0000 mg | ORAL_CAPSULE | Freq: Two times a day (BID) | ORAL | Status: DC
  Administered 2011-07-03 – 2011-07-04 (×3): 100 mg via ORAL
  Filled 2011-07-03 (×5): qty 1

## 2011-07-03 MED ORDER — IPRATROPIUM-ALBUTEROL 18-103 MCG/ACT IN AERO
2.0000 | INHALATION_SPRAY | Freq: Four times a day (QID) | RESPIRATORY_TRACT | Status: DC | PRN
  Filled 2011-07-03: qty 14.7

## 2011-07-03 MED ORDER — ASPIRIN EC 81 MG PO TBEC
81.0000 mg | DELAYED_RELEASE_TABLET | Freq: Every day | ORAL | Status: DC
  Administered 2011-07-03 – 2011-07-04 (×2): 81 mg via ORAL
  Filled 2011-07-03 (×3): qty 1

## 2011-07-03 MED ORDER — DIVALPROEX SODIUM ER 250 MG PO TB24
250.0000 mg | ORAL_TABLET | Freq: Two times a day (BID) | ORAL | Status: DC
  Administered 2011-07-03 – 2011-07-04 (×3): 250 mg via ORAL
  Filled 2011-07-03 (×5): qty 1

## 2011-07-03 MED ORDER — POTASSIUM CHLORIDE CRYS ER 20 MEQ PO TBCR
20.0000 meq | EXTENDED_RELEASE_TABLET | Freq: Every day | ORAL | Status: DC
  Administered 2011-07-03 – 2011-07-04 (×2): 20 meq via ORAL
  Filled 2011-07-03 (×2): qty 1

## 2011-07-03 MED ORDER — PANTOPRAZOLE SODIUM 40 MG PO TBEC
40.0000 mg | DELAYED_RELEASE_TABLET | Freq: Every day | ORAL | Status: DC
  Administered 2011-07-03 – 2011-07-04 (×2): 40 mg via ORAL
  Filled 2011-07-03 (×2): qty 1

## 2011-07-03 MED ORDER — FLUTICASONE-SALMETEROL 250-50 MCG/DOSE IN AEPB
1200.0000 | INHALATION_SPRAY | Freq: Two times a day (BID) | RESPIRATORY_TRACT | Status: DC
  Administered 2011-07-03 – 2011-07-04 (×3): 1200 via RESPIRATORY_TRACT
  Filled 2011-07-03: qty 1204

## 2011-07-03 NOTE — ED Provider Notes (Signed)
Medical screening examination/treatment/procedure(s) were conducted as a shared visit with non-physician practitioner(s) and myself.  I personally evaluated the patient during the encounter  Loren Racer, MD 07/03/11 [phone]

## 2011-07-03 NOTE — ED Provider Notes (Signed)
Patient sitting upright, finishing breakfast.  She denies any complaints, and AH/VH.  Awaiting placement.  Gerhard Munch, MD 07/03/11 [phone]

## 2011-07-04 ENCOUNTER — Inpatient Hospital Stay (HOSPITAL_COMMUNITY)
Admission: AD | Admit: 2011-07-04 | Discharge: 2011-07-13 | DRG: 885 | Disposition: A | Discharge status: Home / Self Care | Payer: Medicare Other | Source: Ambulatory Visit | Attending: Psychiatry | Admitting: Psychiatry

## 2011-07-04 DIAGNOSIS — F250 Schizoaffective disorder, bipolar type: Secondary | ICD-10-CM

## 2011-07-04 DIAGNOSIS — K219 Gastro-esophageal reflux disease without esophagitis: Secondary | ICD-10-CM

## 2011-07-04 DIAGNOSIS — J449 Chronic obstructive pulmonary disease, unspecified: Secondary | ICD-10-CM

## 2011-07-04 DIAGNOSIS — Z7982 Long term (current) use of aspirin: Secondary | ICD-10-CM

## 2011-07-04 DIAGNOSIS — I10 Essential (primary) hypertension: Secondary | ICD-10-CM

## 2011-07-04 DIAGNOSIS — F259 Schizoaffective disorder, unspecified: Principal | ICD-10-CM

## 2011-07-04 DIAGNOSIS — Z888 Allergy status to other drugs, medicaments and biological substances status: Secondary | ICD-10-CM

## 2011-07-04 DIAGNOSIS — F319 Bipolar disorder, unspecified: Secondary | ICD-10-CM

## 2011-07-04 DIAGNOSIS — IMO0002 Reserved for concepts with insufficient information to code with codable children: Secondary | ICD-10-CM

## 2011-07-04 DIAGNOSIS — M810 Age-related osteoporosis without current pathological fracture: Secondary | ICD-10-CM

## 2011-07-04 DIAGNOSIS — Z9119 Patient's noncompliance with other medical treatment and regimen: Secondary | ICD-10-CM

## 2011-07-04 DIAGNOSIS — F17200 Nicotine dependence, unspecified, uncomplicated: Secondary | ICD-10-CM

## 2011-07-04 DIAGNOSIS — J4489 Other specified chronic obstructive pulmonary disease: Secondary | ICD-10-CM

## 2011-07-04 DIAGNOSIS — Z91199 Patient's noncompliance with other medical treatment and regimen due to unspecified reason: Secondary | ICD-10-CM

## 2011-07-04 DIAGNOSIS — E119 Type 2 diabetes mellitus without complications: Secondary | ICD-10-CM

## 2011-07-04 DIAGNOSIS — Z880 Allergy status to penicillin: Secondary | ICD-10-CM

## 2011-07-04 MED ORDER — PERPHENAZINE 4 MG PO TABS
8.0000 mg | ORAL_TABLET | Freq: Two times a day (BID) | ORAL | Status: DC
  Administered 2011-07-04 – 2011-07-05 (×2): 8 mg via ORAL
  Filled 2011-07-04 (×4): qty 2

## 2011-07-04 MED ORDER — METFORMIN HCL 500 MG PO TABS
500.0000 mg | ORAL_TABLET | Freq: Two times a day (BID) | ORAL | Status: DC
  Administered 2011-07-05: 500 mg via ORAL
  Filled 2011-07-04 (×3): qty 1

## 2011-07-04 MED ORDER — ACETAMINOPHEN 325 MG PO TABS
650.0000 mg | ORAL_TABLET | Freq: Four times a day (QID) | ORAL | Status: DC | PRN
  Administered 2011-07-05: 650 mg via ORAL

## 2011-07-04 MED ORDER — ROSUVASTATIN CALCIUM 5 MG PO TABS
5.0000 mg | ORAL_TABLET | Freq: Every day | ORAL | Status: DC
  Filled 2011-07-04: qty 1

## 2011-07-04 MED ORDER — DILTIAZEM HCL ER 240 MG PO CP24
240.0000 mg | ORAL_CAPSULE | Freq: Every day | ORAL | Status: DC
  Administered 2011-07-05 – 2011-07-13 (×9): 240 mg via ORAL
  Filled 2011-07-04 (×12): qty 1

## 2011-07-04 MED ORDER — FLUTICASONE-SALMETEROL 250-50 MCG/DOSE IN AEPB
1.0000 | INHALATION_SPRAY | Freq: Two times a day (BID) | RESPIRATORY_TRACT | Status: DC
  Administered 2011-07-05 – 2011-07-13 (×16): 1 via RESPIRATORY_TRACT
  Filled 2011-07-04 (×2): qty 14

## 2011-07-04 MED ORDER — SIMVASTATIN 20 MG PO TABS: 20.0000 mg | ORAL_TABLET | Freq: Every day | ORAL | Status: DC

## 2011-07-04 MED ORDER — IPRATROPIUM-ALBUTEROL 18-103 MCG/ACT IN AERO
2.0000 | INHALATION_SPRAY | Freq: Four times a day (QID) | RESPIRATORY_TRACT | Status: DC | PRN
  Administered 2011-07-05 – 2011-07-09 (×4): 2 via RESPIRATORY_TRACT
  Filled 2011-07-04: qty 14.7

## 2011-07-04 MED ORDER — NICOTINE 21 MG/24HR TD PT24
21.0000 mg | MEDICATED_PATCH | Freq: Every day | TRANSDERMAL | Status: DC
  Administered 2011-07-05 – 2011-07-13 (×10): 21 mg via TRANSDERMAL
  Filled 2011-07-04 (×11): qty 1

## 2011-07-04 MED ORDER — ASPIRIN EC 81 MG PO TBEC
81.0000 mg | DELAYED_RELEASE_TABLET | Freq: Every day | ORAL | Status: DC
  Administered 2011-07-05 – 2011-07-13 (×9): 81 mg via ORAL
  Filled 2011-07-04 (×11): qty 1

## 2011-07-04 MED ORDER — MAGNESIUM HYDROXIDE 400 MG/5ML PO SUSP: 30.0000 mL | Freq: Every day | ORAL | Status: DC | PRN

## 2011-07-04 MED ORDER — TRAMADOL HCL 50 MG PO TABS
50.0000 mg | ORAL_TABLET | Freq: Four times a day (QID) | ORAL | Status: DC | PRN
  Administered 2011-07-04: 50 mg via ORAL
  Filled 2011-07-04: qty 1

## 2011-07-04 MED ORDER — ALUM & MAG HYDROXIDE-SIMETH 200-200-20 MG/5ML PO SUSP: 30.0000 mL | ORAL | Status: DC | PRN

## 2011-07-04 NOTE — ED Provider Notes (Signed)
Pt accepted to [facility], will transfer stable.   Laray Anger, DO 07/04/11 1549

## 2011-07-04 NOTE — ED Notes (Signed)
Pt accepted to [facility]. [place] to [place] ([phone]). Completed support documentation and updated RN & EDP. Pt is IVC and to be transported via [employer].
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
## 2011-07-04 NOTE — ED Notes (Signed)
Attempted to call report.  Was advised that there was a situation and they would have to call me back.

## 2011-07-04 NOTE — ED Notes (Signed)
Patient up at nursing station asking for a shower. Provided toiletries and clean scrubs. Changed linen and cleaned room while pt showering.

## 2011-07-04 NOTE — Progress Notes (Signed)
In hallway, ambulating in wheelchair on approach. Appears flat and depressed. Calm and cooperative with assessment. A/Ox4. No acute distress noted. States she is adjusting to milieu. Had some questions about her orders and medications. Discussed what was ordered and told her MD would look at her medications first thing in the morning and talk with her about his POC. Verbalized understanding and will f/u with MD in AM. Did c/o pain in her back and prn was provided. Otherwise no questions or concerns expressed. Denies SIHI/AVH and contracts for safety. Denies pain or discomfort. POC and medications for the shift reviewed and understanding verbalized. Safety has been maintained with Q2minute observation. Will continue current POC.

## 2011-07-04 NOTE — ED Notes (Signed)
When cleaning up bathroom

## 2011-07-04 NOTE — ED Notes (Signed)
TC from [facility] Assessment. Informed to hold off on sending pt at this time due to situation on the unit. Informed RN.

## 2011-07-05 DIAGNOSIS — F250 Schizoaffective disorder, bipolar type: Secondary | ICD-10-CM | POA: Diagnosis present

## 2011-07-05 DIAGNOSIS — F259 Schizoaffective disorder, unspecified: Principal | ICD-10-CM

## 2011-07-05 HISTORY — DX: Schizoaffective disorder, bipolar type: F25.0

## 2011-07-05 MED ORDER — CLONAZEPAM 0.5 MG PO TABS
0.5000 mg | ORAL_TABLET | ORAL | Status: DC
  Administered 2011-07-05 – 2011-07-13 (×17): 0.5 mg via ORAL
  Filled 2011-07-05 (×17): qty 1

## 2011-07-05 MED ORDER — SENNOSIDES-DOCUSATE SODIUM 8.6-50 MG PO TABS
1.0000 | ORAL_TABLET | Freq: Every day | ORAL | Status: DC
  Administered 2011-07-05 – 2011-07-13 (×9): 1 via ORAL
  Filled 2011-07-05 (×12): qty 1

## 2011-07-05 MED ORDER — PANTOPRAZOLE SODIUM 40 MG PO TBEC
40.0000 mg | DELAYED_RELEASE_TABLET | Freq: Every day | ORAL | Status: DC
  Administered 2011-07-05 – 2011-07-13 (×9): 40 mg via ORAL
  Filled 2011-07-05 (×10): qty 1

## 2011-07-05 MED ORDER — FUROSEMIDE 40 MG PO TABS
40.0000 mg | ORAL_TABLET | Freq: Every day | ORAL | Status: DC
  Filled 2011-07-05 (×2): qty 1

## 2011-07-05 MED ORDER — DULOXETINE HCL 60 MG PO CPEP
60.0000 mg | ORAL_CAPSULE | Freq: Every day | ORAL | Status: DC
  Administered 2011-07-06 – 2011-07-13 (×8): 60 mg via ORAL
  Filled 2011-07-05 (×10): qty 1

## 2011-07-05 MED ORDER — IBUPROFEN 800 MG PO TABS
800.0000 mg | ORAL_TABLET | Freq: Four times a day (QID) | ORAL | Status: DC | PRN
  Administered 2011-07-06 – 2011-07-13 (×12): 800 mg via ORAL
  Filled 2011-07-05 (×12): qty 1

## 2011-07-05 MED ORDER — DULOXETINE HCL 30 MG PO CPEP
30.0000 mg | ORAL_CAPSULE | Freq: Every day | ORAL | Status: DC
  Administered 2011-07-05: 30 mg via ORAL
  Filled 2011-07-05 (×2): qty 1

## 2011-07-05 MED ORDER — TUBERCULIN PPD 5 UNIT/0.1ML ID SOLN
5.0000 [IU] | Freq: Once | INTRADERMAL | Status: AC
  Administered 2011-07-05: 5 [IU] via INTRADERMAL

## 2011-07-05 MED ORDER — DIVALPROEX SODIUM ER 250 MG PO TB24
250.0000 mg | ORAL_TABLET | Freq: Two times a day (BID) | ORAL | Status: DC
  Administered 2011-07-05 – 2011-07-06 (×2): 250 mg via ORAL
  Filled 2011-07-05 (×4): qty 1

## 2011-07-05 MED ORDER — POTASSIUM CHLORIDE CRYS ER 20 MEQ PO TBCR
20.0000 meq | EXTENDED_RELEASE_TABLET | Freq: Every day | ORAL | Status: DC
  Administered 2011-07-05: 20 meq via ORAL
  Filled 2011-07-05 (×2): qty 1

## 2011-07-05 MED ORDER — PERPHENAZINE 8 MG PO TABS
8.0000 mg | ORAL_TABLET | ORAL | Status: DC
  Administered 2011-07-05 – 2011-07-13 (×16): 8 mg via ORAL
  Filled 2011-07-05 (×8): qty 1
  Filled 2011-07-05: qty 2
  Filled 2011-07-05 (×3): qty 1
  Filled 2011-07-05: qty 2
  Filled 2011-07-05: qty 1
  Filled 2011-07-05: qty 2
  Filled 2011-07-05 (×8): qty 1
  Filled 2011-07-05: qty 2
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
## 2011-07-05 NOTE — Progress Notes (Signed)
Patient has been cooperative and pleasant this afternoon.
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
## 2011-07-05 NOTE — Progress Notes (Signed)
On self inventory sheet, patient sleeps well, has good appetite, normal energy level, good attention span.   Rated depression #7, hopelessness #3.  Denied SI.  Has had back and hip pain.  Worst pain #7.  Plans not to have anything to do with her family, go back to [facility].  Check with staff of [facility] for her meds.

## 2011-07-05 NOTE — Progress Notes (Signed)
Patient ID: Natalie Thomas, female   DOB: [birth year], 52 y.o.   MRN: [id number]  Medication history was validated with a printout from [facility] that was placed in the progress note section of the shadow record.  The plan was reviewed with [name] who voiced her agreement.  We will restart Trilafon 8 mg bid at this time and defer Seroquel 100mg  at bedtime which she had been taking concurrently, but had been not taking any meds very regularly for at least a week or two.  She has not taken lasix or potassium for more than a month and says she doesn't need it.  Cymbalta is increased to 60mg  daily.

## 2011-07-05 NOTE — Tx Team (Addendum)
Interdisciplinary Treatment Plan Update (Adult)  Date:  07/05/2011  Time Reviewed:  10:05 AM   Progress in Treatment: Attending groups:  Yes Participating in groups:    Yes Taking medication as prescribed:  Yes, but new   Tolerating medication:   New, researching medications to determine what needs to be on Family/Significant other contact made:  No, needed Patient understands diagnosis:   Yes, poor judgment Discussing patient identified problems/goals with staff:   Yes Medical problems stabilized or resolved:   Yes Denies suicidal/homicidal ideation:  Yes Issues/concerns per patient self-inventory:  None  Other:  New problem(s) identified: No, Describe:    Reason for Continuation of Hospitalization: Medication stabilization  Interventions implemented related to continuation of hospitalization:  Medication monitoring and adjustment, safety checks Q15 min., group therapy, psychoeducation, collateral contact  Additional comments:  Not applicable  Estimated length of stay:  1-3 days  Discharge Plan:  Needs placement into Assisted Living Facility, will follow up with [name]  New goal(s):  Not applicable  Review of initial/current patient goals per problem list:   1.  Goal(s):  Reduce depression from 10 to 3.  Met:  Yes  Target date:  By Discharge   As evidenced by:  Denies depression today, does not appear to be depressed  2.  Goal(s):  Medication stabilization for mental health diagnosis.  Met:  No  Target date:  By Discharge   As evidenced by:  NP to research medications, make sure get back on correct medications  3.  Goal(s):  Determine placement at discharge.  Met:  No  Target date:  By Discharge   As evidenced by:  [name] has been contacted to come visit, PPD will be done     Attendees: Patient:  Natalie Thomas  07/05/2011 11:17 AM   Family:     Physician:  Dr. Harvie Heck Readling 07/05/2011 11:17 AM   Nursing:   Izola Price, RN  07/05/2011   11:17 AM   Case Manager:  Ambrose Mantle, LCSW 07/05/2011  11:17 AM   Counselor:  Veto Kemps, MT-BC 07/05/2011  11:17 AM   Other:    Lynann Bologna, NP 07/05/2011  11:17 AM  Other:      Other:      Other:       Scribe for Treatment Team:   Sarina Ser, 07/05/2011, 10:05 AM

## 2011-07-06 LAB — T3, FREE: T3, Free: 2.4 pg/mL (ref 2.3–4.2)

## 2011-07-06 MED ORDER — DIVALPROEX SODIUM ER 500 MG PO TB24
500.0000 mg | ORAL_TABLET | Freq: Two times a day (BID) | ORAL | Status: DC
  Administered 2011-07-06 – 2011-07-13 (×14): 500 mg via ORAL
  Filled 2011-07-06 (×19): qty 1

## 2011-07-06 MED ORDER — DULOXETINE HCL 30 MG PO CPEP
30.0000 mg | ORAL_CAPSULE | Freq: Every day | ORAL | Status: DC
  Administered 2011-07-06 – 2011-07-12 (×7): 30 mg via ORAL
  Filled 2011-07-06 (×9): qty 1

## 2011-07-06 NOTE — Progress Notes (Signed)
Patient was interviewed early in the shift from a representative from [facility].  He will come back and speak to her again on Monday.  Patient became tearful and angry because another patient was able to be discharged with him today and she had to stay for the weekend.  Offered support to the patient and reminded her that the extra few days would give [place] a chance to ensure that her medications were right.  She was agreeable and quiet for the rest of my shift.

## 2011-07-06 NOTE — Progress Notes (Signed)
Recreation Therapy Notes  07/06/2011         Time: 1100      Group Topic/Focus: The focus of the group is on enhancing the patients' ability to cope with stressors by understanding what coping is, why it is important, the negative effects of stress and developing healthier coping skills. Patients practice [place] and discuss how exercise can be used as a healthy coping strategy.  Participation Level: Minimal  Participation Quality: Distracted  Affect: Blunted  Cognitive: Confused   Additional Comments: Patient in and out of group, was observed to be giving away her personal belongings (makeup, combs, etc.) to peers as gifts. Patient also observed to have her eyes closed, talking for much of group- patient reports she was praying.  Osric Klopf 07/06/2011 12:23 PM

## 2011-07-06 NOTE — Progress Notes (Signed)
[facility] Group Notes:  (Counselor/Nursing/MHT/Case Management/Adjunct)  07/06/2011 1:11 PM  Type of Therapy:  group Therapy  Participation Level:  Did Not Attend  Veto Kemps 07/06/2011, 1:11 PM

## 2011-07-06 NOTE — Discharge Planning (Signed)
Met with patient in Aftercare Planning Group.   Prepared her for interview with [name] representative.  Did FL-2 and sent to facility.  During Aftercare Planning Group, Case Manager provided psychoeducation on "Suicide Prevention Information."  This included descriptions of risk factors for suicide, warning signs that an individual is in crisis and thinking of suicide, and what to do if this occurs.  Pt indicated understanding of information provided, and will read brochure given upon discharge.   She was tearful throughout this discussion.  After interview with ALF, the representative decided to come interview patient again on Monday prior to deciding.  Ambrose Mantle, LCSW 07/06/2011, 5:54 PM

## 2011-07-06 NOTE — Progress Notes (Signed)
[facility] Group Notes:  (Counselor/Nursing/MHT/Case Management/Adjunct)  07/06/2011 10:17 AM  Type of Therapy:  Group Therapy  Participation Level:  Did Not Attend   Veto Kemps 07/06/2011, 10:17 AM

## 2011-07-06 NOTE — Progress Notes (Signed)
Pt is depressed and sad   She uses a wheelchair and has a weak unsteady gait  She has been up most of the night saying she was pretending to smoke a cigarette to help her nerves  She is cooperative and compliant with treatment   She has not been asking for medications as she was reported to have done on the previous shift   Verbal support given  Medications administered and effectiveness monitored   Q 15 min checks  Pt safe at present

## 2011-07-06 NOTE — Progress Notes (Signed)
[facility] MD Progress Note  07/06/2011 2:04 PM  Diagnosis:  Axis I: Schizoaffective Disorder - Bipolar Type.   The patient was seen today and reports the following:   ADL's: Intact.  Sleep: The patient reports to sleeping well last night.  Appetite: The patient reports a good appetite.   Mild>(1-10) >Severe  Hopelessness (1-10): 0  Depression (1-10): 2-3  Anxiety (1-10): 0   Suicidal Ideation: The patient adamantly denies any suicidal ideations today.  Plan: No  Intent: No  Means: No   Homicidal Ideation: The patient adamantly denies any homicidal ideations today.  Plan: No  Intent: No.  Means: No   General Appearance /Behavior: Casual and cooperative.  Eye Contact: Fair to Good.  Speech: Appropriate in rate and volume.  Motor Behavior: Appropriate.  Level of Consciousness: Alert and Oriented x 3.  Mental Status: Alert and Oriented x 3.  Mood: Mildly Depressed.  Affect: Mildly Constricted.  Anxiety Level: None Reported.  Thought Process: WNL.  Thought Content: The patient denies any auditory or visual hallucinations or delusional thinking today.  Perception:. wnl.  Judgment: Fair to Good.  Insight: Fair to Good.  Cognition: Orientation time, place and person.  Sleep:  Number of Hours: 3   Vital Signs:Blood pressure 142/83, pulse 90, temperature 97.7 F (36.5 C), temperature source Oral, resp. rate 16, height 5\' 1"  (1.549 m), weight 89.812 kg (198 lb), SpO2 97.00%.  Lab Results:  Results for orders placed during the hospital encounter of 07/04/11 (from the past 48 hour(s))  TSH     Status: Normal   Collection Time   07/05/11  7:54 PM      Component Value Range Comment   TSH 2.201  0.350 - 4.500 (uIU/mL)   T3, FREE     Status: Normal   Collection Time   07/05/11  7:54 PM      Component Value Range Comment   T3, Free 2.4  2.3 - 4.2 (pg/mL)   T4, FREE     Status: Normal   Collection Time   07/05/11  7:54 PM      Component Value Range Comment   Free T4 0.89  0.80 -  1.80 (ng/dL)   VALPROIC ACID LEVEL     Status: Abnormal   Collection Time   07/05/11  7:54 PM      Component Value Range Comment   Valproic Acid Lvl 19.2 (*) 50.0 - 100.0 (ug/mL)    Treatment Plan Summary:  1. Daily contact with patient to assess and evaluate symptoms and progress in treatment  2. Medication management  3. The patient will deny suicidal ideations or homicidal ideations for 48 hours prior to discharge and have a depression and anxiety rating of 3 or less. The patient will also deny any auditory or visual hallucinations or delusional thinking.   Plan:  1. Will continue current medications.  2. Will increase Cymbalta to 60 mgs po a am and 30 mgs po q 2 pm to further address her pain and depression. 3. Will increase the medication Depakote ER to 500 mgs po BID to further provide mood stabilization. 4. Will continue to monitor.   Randy Readling 07/06/2011, 2:04 PM

## 2011-07-06 NOTE — Progress Notes (Signed)
Patient states that she had a 'rough' day today. When asked what happened, patient would not elaborate. Denies SI/HI/AV. Patient has been polite, quiet, guarded. Requesting toiletries this evening, and these were given by this nurse to patient. Patient ambulates in hallway with wheelchair. Verbalized no complaints or no complications to this nurse this evening.

## 2011-07-07 NOTE — Progress Notes (Signed)
[facility] Group Notes:  (Counselor/Nursing/MHT/Case Management/Adjunct)  07/07/2011 4:17 PM  Type of Therapy:  After Care Planning Group  Pt. attedned after care planning group and was given [place] SI information and crisis and hotline numbers . Pt. Agreed to use them if needed.   Natalie Thomas 07/07/2011, 4:17 PM

## 2011-07-07 NOTE — Progress Notes (Signed)
Patient ID: Natalie Thomas, female   DOB: [birth year], 51 y.o.   MRN: [id number] S: "I feel so much better than yesterday". Natalie Thomas slept well last night and ate breakfast this morning.  No more episodes of agitation.  Is taking medications as prescribed.  Rates Depression a 0/10, and denies any anxiety or hopelessness.  Wishes she could leave today to go to assisted living, but cooperative with the idea of staying until her placement can be secured.  Assisted living is coming Monday to interview her.   No dangerous ideas.    Plan:  Continue current plan.

## 2011-07-07 NOTE — Progress Notes (Signed)
[facility] Group Notes:  (Counselor/Nursing/MHT/Case Management/Adjunct)  07/07/2011 11 AM  Type of Therapy:  Group Therapy, Dance/Movement Therapy   Participation Level:  Minimal  Participation Quality:  Resistant  Affect:  Depressed  Cognitive:  Oriented  Insight:  Limited  Engagement in Group:  Limited  Engagement in Therapy:  Limited  Modes of Intervention:  Clarification, Problem-solving, Role-play, Socialization and Support  Summary of Progress/Problems:pt participated in a group discussion on feelings. Pt stated that they felt like the color "purple" for it means "love".    Natalie Thomas

## 2011-07-07 NOTE — Progress Notes (Signed)
Pt is in a wheel chair all of the time  She has an unsteady gait   She is pleasant and cooperative  Her mood is depressed but has improved and her appearance and hygiene has improved   She was smiling and more spontaneous to conversation when coming for her medications   Verbal support given  Medications administered and effectiveness monitored  Q 15 min checks  Pt safe at present

## 2011-07-08 NOTE — Progress Notes (Signed)
Pt is somatically focused and is asking for medications frequently   She is depressed and anxious   She is cooperative and compliant with treatment and she attends groups  and participates   Her interactions with others are appropriate   Verbal support given   Medications administered and effectiveness monitored   Q 15 min checks  Discuss coping skills with pt   Pt safe at present

## 2011-07-08 NOTE — Progress Notes (Signed)
Patient ID: Natalie Thomas, female   DOB: Jul 27, 1960, 51 y.o.   MRN: [id number]  Natalie Thomas is looking forward to being discharged tomorrow and says she is doing very well on her currnet medications.  She has had no further episodes of agitation or panic.  Wishes she could have a cigarette.  Denies depression, denies suicidal thoughts, intents or plans.  She is getting along well with her new roommate and has no concerns today.

## 2011-07-08 NOTE — Progress Notes (Signed)
Patient ID: Natalie Thomas, female   DOB: 21-Apr-1961, 51 y.o.   MRN: [id number] Was lying in bed this evening, needed encouragement to attend group, had snack and went back to bed without coming to med window.  Meds were taken to pt , no specific c/o's voiced.  Seemed appreciative that meds were brought to her,  Seemed tired and not very conversational, flat and depressedl.  Will continue to monitor.

## 2011-07-08 NOTE — Progress Notes (Signed)
[facility] Group Notes:  (Counselor/Nursing/MHT/Case Management/Adjunct)  07/08/2011 11 AM  Type of Therapy:  Group Therapy, Dance/Movement Therapy   Participation Level:  Active  Participation Quality:  Appropriate, Attentive, Sharing and Supportive  Affect:  Appropriate  Cognitive:  Appropriate  Insight:  Limited  Engagement in Group:  Limited  Engagement in Therapy:  Limited  Modes of Intervention:  Clarification, Problem-solving, Role-play, Socialization and Support  Summary of Progress/Problems:Pt participated in a group discussion on how sunshine helps elevate mood. Pt stated that they feel "hopeful" today for she likes being in the sunshine, pt was sitting by the window looking out at the sun.  Gevena Mart

## 2011-07-09 ENCOUNTER — Ambulatory Visit: Payer: Medicare Other | Admitting: Endocrinology

## 2011-07-09 MED ORDER — POTASSIUM CHLORIDE CRYS ER 20 MEQ PO TBCR: 20.0000 meq | EXTENDED_RELEASE_TABLET | Freq: Every day | ORAL | Status: DC

## 2011-07-09 MED ORDER — IPRATROPIUM-ALBUTEROL 18-103 MCG/ACT IN AERO: 2.0000 | INHALATION_SPRAY | Freq: Four times a day (QID) | RESPIRATORY_TRACT | Status: DC | PRN

## 2011-07-09 MED ORDER — PRAVASTATIN SODIUM 40 MG PO TABS: 40.0000 mg | ORAL_TABLET | Freq: Every day | ORAL | Status: DC

## 2011-07-09 MED ORDER — ASPIRIN 81 MG PO TABS: 81.0000 mg | ORAL_TABLET | Freq: Every day | ORAL | Status: DC

## 2011-07-09 MED ORDER — VITAMIN D 1000 UNITS PO TABS: 1000.0000 [IU] | ORAL_TABLET | Freq: Every day | ORAL | Status: DC

## 2011-07-09 MED ORDER — DOCUSATE SODIUM 100 MG PO CAPS: 100.0000 mg | ORAL_CAPSULE | Freq: Two times a day (BID) | ORAL | Status: DC

## 2011-07-09 MED ORDER — FLUTICASONE-SALMETEROL 250-50 MCG/DOSE IN AEPB: 1.0000 | INHALATION_SPRAY | Freq: Two times a day (BID) | RESPIRATORY_TRACT | Status: DC

## 2011-07-09 MED ORDER — OMEPRAZOLE 20 MG PO CPDR: 20.0000 mg | DELAYED_RELEASE_CAPSULE | Freq: Every day | ORAL | Status: DC

## 2011-07-09 MED ORDER — DULOXETINE HCL 30 MG PO CPEP: 30.0000 mg | ORAL_CAPSULE | Freq: Every day | ORAL | Status: DC

## 2011-07-09 MED ORDER — DILTIAZEM HCL ER 240 MG PO CP24: 240.0000 mg | ORAL_CAPSULE | Freq: Every day | ORAL | Status: DC

## 2011-07-09 MED ORDER — SENNOSIDES-DOCUSATE SODIUM 8.6-50 MG PO TABS: 1.0000 | ORAL_TABLET | Freq: Every day | ORAL | Status: DC

## 2011-07-09 MED ORDER — FUROSEMIDE 40 MG PO TABS: 40.0000 mg | ORAL_TABLET | Freq: Every day | ORAL | Status: DC

## 2011-07-09 MED ORDER — CALCIUM CARBONATE-VITAMIN D 600-400 MG-UNIT PO TABS: 1.0000 | ORAL_TABLET | Freq: Two times a day (BID) | ORAL | Status: DC

## 2011-07-09 MED ORDER — TRAMADOL HCL 50 MG PO TABS: 50.0000 mg | ORAL_TABLET | Freq: Four times a day (QID) | ORAL | Status: DC | PRN

## 2011-07-09 MED ORDER — PERPHENAZINE 8 MG PO TABS: 8.0000 mg | ORAL_TABLET | ORAL | Status: DC

## 2011-07-09 MED ORDER — ACETAMINOPHEN 325 MG PO TABS: 650.0000 mg | ORAL_TABLET | Freq: Four times a day (QID) | ORAL | Status: DC | PRN

## 2011-07-09 MED ORDER — DIVALPROEX SODIUM ER 500 MG PO TB24: 500.0000 mg | ORAL_TABLET | Freq: Two times a day (BID) | ORAL | Status: DC

## 2011-07-09 MED ORDER — CLONAZEPAM 0.5 MG PO TABS: 0.5000 mg | ORAL_TABLET | ORAL | Status: DC

## 2011-07-09 MED ORDER — TOPIRAMATE 100 MG PO TABS: 100.0000 mg | ORAL_TABLET | Freq: Two times a day (BID) | ORAL | Status: DC

## 2011-07-09 NOTE — Progress Notes (Signed)
[facility] Adult Inpatient Family/Significant Other Suicide Prevention Education  Suicide Prevention Education:  Patient Discharged to Other Healthcare Facility:  Suicide Prevention Education Not Provided: {PT. DISCHARGED TO [name]:SUICIDE PREVENTION EDUCATION NOT PROVIDED [facility]):  The patient is discharging to another healthcare facility for continuation of treatment.  The patient's medical information, including suicide ideations and risk factors, are a part of the medical information shared with the receiving healthcare facility.  HartisAram Beecham 07/09/2011, 3:51 PM

## 2011-07-09 NOTE — Tx Team (Signed)
Interdisciplinary Treatment Plan Update (Adult)  Date:  07/09/2011  Time Reviewed:  11:00AM  Progress in Treatment: Attending groups:  Yes Participating in groups:    Yes, limited to her own needs and demands Taking medication as prescribed:    Yes Tolerating medication:   Yes Family/Significant other contact made:  No Patient understands diagnosis:   Yes, limited insight, poor judgment Discussing patient identified problems/goals with staff:   Yes, limited to discussion re going to [facility] problems stabilized or resolved:   Yes Denies suicidal/homicidal ideation:  Yes Issues/concerns per patient self-inventory:   None  Other:  New problem(s) identified: Yes, Describe:  placement issue  Reason for Continuation of Hospitalization: Anxiety Depression Medication stabilization Other; describe placement  Interventions implemented related to continuation of hospitalization:  Medication monitoring and adjustment, safety checks Q15 min., group therapy, psychoeducation, collateral contact  Additional comments:  Not applicable  Estimated length of stay:  2-3 days  Discharge Plan:  Unknown, as do not know where we will find placement; will follow up at [facility]  New goal(s):  Not applicable  Review of initial/current patient goals per problem list:   1.  Goal(s):  Reduce depression from 10 to 3.  Met:  No  Target date:  By Discharge   As evidenced by:  Still depressed  2.  Goal(s):  Medication stabilization for mental health diagnosis.  Met:  No  Target date:  By Discharge   As evidenced by:  Close to baseline; however, would like additional time stable  3.  Goal(s):  Determine placement at discharge.  Met:  No  Target date:  By Discharge   As evidenced by:  [name] refused to take her, so working on different placement now     Attendees: Patient:      Family:     Physician:  Dr. Harvie Heck Readling 07/09/2011 11:00AM  Nursing:   Robbie Louis, RN 07/09/2011   11:00AM  Case Manager:  Ambrose Mantle, LCSW 07/09/2011  11:00AM  Counselor:       Other:   Lynann Bologna, NP 07/09/2011 11:00AM  Other:      Other:      Other:       Scribe for Treatment Team:   Sarina Ser, 07/09/2011, 11:00AM

## 2011-07-09 NOTE — Discharge Planning (Signed)
Met with patient in Aftercare Planning Group.   She displayed anxiety re being accepted at [facility].  She begged Case Manager to tell the director "I've changed".  When CM did speak with Mr. Alanda Slim, he has decided not to accept patient back into the facility.  Case Manager updated the FL-2 and sent to 10 other locations in [facility], awaiting call backs.  Per State Regulation 482.30  This chart was reviewed for medical necessity with respect to the patient's Admission/Duration of stay.   Next review due:  07/12/11   Ambrose Mantle, LCSW  07/09/2011  12:31 PM

## 2011-07-09 NOTE — Progress Notes (Signed)
In hallway ambulating in wheel chair on approach. Appears flat and depressed. Calm and cooperative with assessment. A/Ox4. No acute distress noted. States she had a bad day. When asked to qualify bad day, replied that she was upset that [facility] would not take her back. States she was hopeful to go there, but now doesn't know what her plan will be. States she is open to anything at this point. Support and encouragement provided. Did c/o pain in lower back. PRN offered and accepted. Otherwise offered no additional questions or concerns.. Denies SI/HI/AVH and contracts for safety. POC and medications for the shift reviewed and understanding verbalized. Safety has been maintained with Q32minute observation. Will f/u response to prn and continue current POC.

## 2011-07-09 NOTE — Progress Notes (Signed)
Patient ID: Natalie Thomas, female   DOB: [birth year], 51 y.o.   MRN: [id number] Seemed tired this evening, went to bed early, meds were taken to her .  Was pleasant and cooperative, was able to transfer to w/c and come out to dayroom for snacks but went back to room, sat up for a few minutes, then back to bed, affect flat, depressed.  Will continue to monitor.

## 2011-07-09 NOTE — Progress Notes (Signed)
[facility] MD Progress Note  07/09/2011 3:42 PM  Diagnosis:  Axis I: Schizoaffective Disorder - Bipolar Type.   The patient was seen today and reports the following:   ADL's: Intact.  Sleep: The patient reports to sleeping well last night.  Appetite: The patient reports a good appetite.   Mild>(1-10) >Severe  Hopelessness (1-10): 0  Depression (1-10): 0  Anxiety (1-10): 0   Suicidal Ideation: The patient adamantly denies any suicidal ideations today.  Plan: No  Intent: No  Means: No   Homicidal Ideation: The patient adamantly denies any homicidal ideations today.  Plan: No  Intent: No.  Means: No   General Appearance /Behavior: Casual and cooperative.  Eye Contact: Good.  Speech: Appropriate in rate and volume.  Motor Behavior: Appropriate.  Level of Consciousness: Alert and Oriented x 3.  Mental Status: Alert and Oriented x 3.  Mood: Essentially Euthymic.  Affect: Mildly Constricted.  Anxiety Level: None Reported.  Thought Process: WNL.  Thought Content: The patient denies any auditory or visual hallucinations or delusional thinking today.  Perception:. wnl.  Judgment: Fair to Good.  Insight: Fair to Good.  Cognition: Orientation time, place and person.  Sleep:  Number of Hours: 6   Vital Signs:Blood pressure 127/75, pulse 87, temperature 98.2 F (36.8 C), temperature source Oral, resp. rate 20, height 5\' 1"  (1.549 m), weight 89.812 kg (198 lb), SpO2 97.00%.  Lab Results: No results found for this or any previous visit (from the past 48 hour(s)).  Treatment Plan Summary:  1. Daily contact with patient to assess and evaluate symptoms and progress in treatment  2. Medication management  3. The patient will deny suicidal ideations or homicidal ideations for 48 hours prior to discharge and have a depression and anxiety rating of 3 or less. The patient will also deny any auditory or visual hallucinations or delusional thinking.   Plan:  1. Will continue current  medications.  2. Will continue to monitor.  3. Patient denied by [facility] today.  Will discharge once placement is found.  Natalie Thomas 07/09/2011, 3:42 PM

## 2011-07-09 NOTE — Progress Notes (Signed)
Recreation Therapy Notes  07/09/2011         Time: 0930      Group Topic/Focus: The focus of this group is on enhancing the patient's understanding of leisure, barriers to leisure, and the importance of engaging in positive leisure activities upon discharge for improved total health.  Participation Level: Active  Participation Quality: Appropriate and Attentive  Affect: Appropriate  Cognitive: Oriented   Additional Comments: None.   Natalie Thomas 07/09/2011 11:46 AM 

## 2011-07-09 NOTE — Progress Notes (Signed)
Pt was up in dayroom upon first assessment.  She was given her self-inventory and rated her depression a 1 and wrote "I feel hopeful" beside the blank for hopelessness.  She denied any anxiety at that time.  However, as the day progressed and she found out that [facility] turned her down she became tearful and crying out in her room.  She was demanding to sign a 72 hour request for discharge.  She claimed she signed one on Friday but there was not one in her shadow chart nor was any documentation noted related to her signing the 72 hour discharge.  After we talked, and she read the request form, she changed her mind and didn't want to sign one for fear the doctor could make her involuntary if the 72 hours were up and she didn't have a safe place to go.  She wanted CM to call her brother to see if she could stay with him which she did and he declined.  Pt still tearful over [name] she stated,"I feel they are judging me from my past.  I am not like that anymore"  For now, the CM has sent out the [facility] 2 to other places that may take pt.  Pt is now calmer and cooperative again.

## 2011-07-10 NOTE — Progress Notes (Signed)
[facility] Group Notes:  (Counselor/Nursing/MHT/Case Management/Adjunct)  07/10/2011 12:57 PM  Type of Therapy:  Group Therapy  Participation Level:  Active  Participation Quality:  Attentive and Sharing  Affect:  Appropriate  Cognitive:  Oriented  Insight:  Limited  Engagement in Group:  Good  Engagement in Therapy:  Good  Modes of Intervention:  Education and Support  Summary of Progress/Problems: Patient was excited about someone from the group home visiting again today. She stated that they thought she was a little depressed on Friday and wanted to give her a little time. Hopeful that everything will work out.   HartisAram Beecham 07/10/2011, 12:57 PM

## 2011-07-10 NOTE — Progress Notes (Signed)
[facility] MD Progress Note  07/10/2011 12:25 PM  Diagnosis:  Axis I: Schizoaffective Disorder - Bipolar Type.   The patient was seen today and reports the following:   ADL's: Intact.  Sleep: The patient reports to continuing to sleep well at night.  Appetite: The patient reports a good appetite.   Mild>(1-10) >Severe  Hopelessness (1-10): 0  Depression (1-10): 0  Anxiety (1-10): 0   Suicidal Ideation: The patient adamantly denies any suicidal ideations today.  Plan: No  Intent: No  Means: No   Homicidal Ideation: The patient adamantly denies any homicidal ideations today.  Plan: No  Intent: No.  Means: No   General Appearance /Behavior: Casual and cooperative.  Eye Contact: Good.  Speech: Appropriate in rate and volume.  Motor Behavior: Appropriate.  Level of Consciousness: Alert and Oriented x 3.  Mental Status: Alert and Oriented x 3.  Mood: Essentially Euthymic.  Affect: Mildly Constricted.  Anxiety Level: None Reported.  Thought Process: WNL.  Thought Content: The patient denies any auditory or visual hallucinations or delusional thinking today.  Perception:. wnl.  Judgment: Fair to Good.  Insight: Fair to Good.  Cognition: Orientation time, place and person.  Sleep:  Number of Hours: 6   Vital Signs:Blood pressure 118/82, pulse 89, temperature 97.9 F (36.6 C), temperature source Oral, resp. rate 16, height 5\' 1"  (1.549 m), weight 89.812 kg (198 lb), SpO2 97.00%.  Lab Results: No results found for this or any previous visit (from the past 48 hour(s)).  Time was spent today discussing with the patient her disappointment of not being accepted to [facility] yesterday.  The patient apologized for her behavior after the denial.  Placement is pending.  Treatment Plan Summary:  1. Daily contact with patient to assess and evaluate symptoms and progress in treatment  2. Medication management  3. The patient will deny suicidal ideations or homicidal ideations for 48 hours  prior to discharge and have a depression and anxiety rating of 3 or less. The patient will also deny any auditory or visual hallucinations or delusional thinking.   Plan:  1. Will continue current medications.  2. Will continue to monitor.  3. Will discharge once placement is found.  Randy Readling 07/10/2011, 12:25 PM

## 2011-07-10 NOTE — Progress Notes (Signed)
In hallway talking with peers on approach. Agreed to speak with Clinical research associate in her room. Writer pushed pt to room in wheelchair. Appears flat and depressed. Calm and cooperative with assessment. States she has had a "So-SO" day r/t her continued rejection by ALFs. States she is ready to go and is eager to find a new place, trying to stay hopeful. Discussed guardianship issue and writer tried to help pt reframe her thoughts on the situation. Helped to identify positive things to take from it. Pt appeared to take ideas in and seemed much more comfortable with the idea. Thanked Clinical research associate for helping her think about it differently. Has been ambulating in wheelchair without issue. Otherwise offered no additional questions or concerns.. Denies SI/HI/AVH and contracts for safety. POC and medications for the shift reviewed and understanding verbalized. Safety has been maintained with Q74minute observation. Will f/u response to prn and continue current POC.

## 2011-07-10 NOTE — Progress Notes (Signed)
[facility] Group Notes:  (Counselor/Nursing/MHT/Case Management/Adjunct)  07/10/2011 12:59 PM  Type of Therapy:  Group Therapy  Participation Level:  Active  Participation Quality:  Attentive and Sharing  Affect:  Appropriate  Cognitive:  Oriented  Insight:  Limited  Engagement in Group:  Good  Engagement in Therapy:  Good  Modes of Intervention:  Education and Support  Summary of Progress/Problems: Patient talked about being angry, sad, rejected and disappointed after the group home turned her down yesterday. She had acted angrily toward Sports coach and was apologetic. She stated that she just has to have patience and she hasn't ever been good at this. She is more hopeful today that she can find a group home. Talked about her things being at [place] group and at her brother's house.   [name] 07/10/2011, 12:59 PM

## 2011-07-10 NOTE — Progress Notes (Signed)
Pt has been up for all groups today.  She has been calm and cooperative thus far.  She did get upset that a Sheriff brought here paperwork that states her Natalie Thomas is trying to get guardianship for patient.  Pt did deny any depression, hopelessness or anxiety on her self-inventory.  She did write on there that she wished someone would accept me so I can go home.  Pt is waiting for placement.

## 2011-07-10 NOTE — Discharge Planning (Signed)
Met with patient in Aftercare Planning Group.   Told her that Case Manager is continuing to look for placement, and she needs to be patient.  She apologized for accusations and outbursts yesterday.  Spoke with patient's brother Iviana Blasingame [phone], who reported "no way in h--- she can come back here."  Stated he had not seen her for 2-3 years prior to her recent incident where she ended up in his home, and she cannot return due to her behaviors.  Spoke to [employer] from [facility], who reported she cannot come back there either because she is not stable.  Called, then sent FL-2 to several more sites, this time by personal fax rather than through [facility] system, as there seem to be issues with some providers being able to see FL-2s on that system.  Awaiting calls back.  Ambrose Mantle, LCSW 07/10/2011, 3:01 PM

## 2011-07-11 MED ORDER — NONFORMULARY OR COMPOUNDED ITEM
1.0000 | Freq: Once | Status: AC
  Administered 2011-07-13 (×2): 1 via TOPICAL
  Filled 2011-07-11: qty 1

## 2011-07-11 MED ORDER — HYDROCODONE-ACETAMINOPHEN 5-325 MG PO TABS
1.0000 | ORAL_TABLET | Freq: Two times a day (BID) | ORAL | Status: DC | PRN
  Administered 2011-07-11 – 2011-07-12 (×3): 1 via ORAL
  Filled 2011-07-11 (×3): qty 1
  Filled 2011-07-11: qty 5

## 2011-07-11 MED ORDER — TUBERCULIN PPD 5 UNIT/0.1ML ID SOLN
5.0000 [IU] | Freq: Once | INTRADERMAL | Status: AC
  Administered 2011-07-11: 5 [IU] via INTRADERMAL

## 2011-07-11 NOTE — Progress Notes (Signed)
Recreation Therapy Notes  07/11/2011         Time: 0930      Group Topic/Focus: The focus of this group is on discussing various styles of communication and communicating assertively using 'I' (feeling) statements.  Participation Level: Active  Participation Quality: Appropriate and Attentive  Affect: Appropriate  Cognitive: Oriented   Additional Comments: None.   Tiah Heckel 07/11/2011 11:51 AM

## 2011-07-11 NOTE — Progress Notes (Signed)
[facility] MD Progress Note  07/11/2011 12:25 PM  Diagnosis:  Axis I: Schizoaffective Disorder - Bipolar Type.   The patient was seen today and reports the following:   ADL's: Intact.  Sleep: The patient reports to continuing to sleep well at night.  Appetite: The patient reports a good appetite.   Mild>(1-10) >Severe  Hopelessness (1-10): 0  Depression (1-10): 0  Anxiety (1-10): 0   Suicidal Ideation: The patient adamantly denies any suicidal ideations today.  Plan: No  Intent: No  Means: No   Homicidal Ideation: The patient adamantly denies any homicidal ideations today.  Plan: No  Intent: No.  Means: No   General Appearance /Behavior: Casual and cooperative.  Eye Contact: Good.  Speech: Appropriate in rate and volume.  Motor Behavior: Appropriate.  Level of Consciousness: Alert and Oriented x 3.  Mental Status: Alert and Oriented x 3.  Mood: Essentially Euthymic.  Affect: Mildly Constricted.  Anxiety Level: None Reported or observed.  Thought Process: WNL.  Thought Content: The patient denies any auditory or visual hallucinations or delusional thinking today.  Perception:. wnl.  Judgment: Fair to Good.  Insight: Fair to Good.  Cognition: Orientation time, place and person.  Sleep:  Number of Hours: 6.75   Vital Signs:Blood pressure 133/80, pulse 84, temperature 98.2 F (36.8 C), temperature source Oral, resp. rate 18, height 5\' 1"  (1.549 m), weight 89.812 kg (198 lb), SpO2 97.00%.  Lab Results: No results found for this or any previous visit (from the past 48 hour(s)).  Time was spent discussing with the patient her frustrations related to not having a placement.  She also reported episodic severe back and hip pain and reports to taking Hydrocodone at home for this.  After some discussion, I agreed to start low dose hydrocodone 5/325 mgs po BID - prn for severe back and or hip pain.  Treatment Plan Summary:  1. Daily contact with patient to assess and evaluate symptoms  and progress in treatment  2. Medication management  3. The patient will deny suicidal ideations or homicidal ideations for 48 hours prior to discharge and have a depression and anxiety rating of 3 or less. The patient will also deny any auditory or visual hallucinations or delusional thinking.   Plan:  1. Will continue current medications.  2. Will continue to monitor.  3. Will start Hydrocodone 5/325 mgs po BID - prn - Severe Pain. 4. Will order PPD today as needed for placement. 5. Will discharge once placement is found.  Ota Ebersole 07/11/2011, 12:25 PM

## 2011-07-11 NOTE — Discharge Planning (Signed)
Met with patient in Aftercare Planning Group.   Continues to have questions about her aunt's filing for her to be assigned a guardian.  Case Manager continued to answer her questions, to her satisfaction.  Shared repeatedly with her that a guardian is someone who will help her make decisions.  During Aftercare Planning Group, Case Manager provided psychoeducation on "Suicide Prevention Information."  This included descriptions of risk factors for suicide, warning signs that an individual is in crisis and thinking of suicide, and what to do if this occurs.  Pt indicated understanding of information provided, and will read brochure given upon discharge.     After multiple talks with Joetta Manners, they reveal that they are considering her application but do not have a bed available currently.  Case Manager contacted Roena Malady about a bed she has available in one of her 3 facilities.  Faxed her the FL-2 and history, psych assessment.  She will come to visit patient tomorrow morning.  Ambrose Mantle, LCSW 07/11/2011, 1:24 PM

## 2011-07-11 NOTE — Progress Notes (Signed)
Pt has been up and active in the milieu today.  She is intrusive but is more humble today than yesterday.  She really keeps asking about if there is any information about a place for her yet.  She will ask every time she sees this nurse even if it has only been 30 minutes that has gone by.  She rated her depression, hopelessness and anxiety all a 1 today.  She denies any S/H ideations or any A/V hallucinations.  Dr. Allena Katz did order a PPD to be placed and started her on hydrocodone and d/c'd her tylenol.  She has not c/o pain since 0808 this morning before the new order was written so she was given Ibuprofen which she claims helped her.  Late in the day, CM informed this nurse that some one was coming to interview pt tomorrow for placement.  Pt is aware and stated, "I will be glad when tomorrow comes"

## 2011-07-11 NOTE — Progress Notes (Signed)
In hallway on approach, had asked to speak to writer r/t pain. Appears flat and depressed. Calm and cooperative with assessment. A/Ox4. No acute distress. States she has had a better day r/t feeling more optimistic about possible placement being found. States she feels like it is a good place and she is glad she can smoke there. Support and encouragement provided. Urged caution in getting hopes too high based on her intermittent depressive episodes and anxiety r/t future placement options and let-down. Did c/o pain in her lower back. PRN offered and accepted. Has been ambulating in wheelchair without issue. Otherwise offered no additional questions or concerns.. Denies SI/HI/AVH and contracts for safety. POC and medications for the shift reviewed and understanding verbalized. Safety has been maintained with Q57minute observation. Will f/u response to prn and continue current POC.

## 2011-07-12 MED ORDER — DULOXETINE HCL 30 MG PO CPEP
30.0000 mg | ORAL_CAPSULE | Freq: Every day | ORAL | Status: DC
  Filled 2011-07-12: qty 7

## 2011-07-12 MED ORDER — IPRATROPIUM-ALBUTEROL 18-103 MCG/ACT IN AERO: 2.0000 | INHALATION_SPRAY | Freq: Four times a day (QID) | RESPIRATORY_TRACT | Status: DC | PRN

## 2011-07-12 MED ORDER — DULOXETINE HCL 60 MG PO CPEP
60.0000 mg | ORAL_CAPSULE | Freq: Every day | ORAL | Status: DC
  Filled 2011-07-12 (×2): qty 7

## 2011-07-12 MED ORDER — PRAVASTATIN SODIUM 40 MG PO TABS: 40.0000 mg | ORAL_TABLET | Freq: Every day | ORAL | Status: DC

## 2011-07-12 MED ORDER — ASPIRIN 81 MG PO TABS: 81.0000 mg | ORAL_TABLET | Freq: Every day | ORAL | Status: DC

## 2011-07-12 MED ORDER — PANTOPRAZOLE SODIUM 40 MG PO TBEC
40.0000 mg | DELAYED_RELEASE_TABLET | Freq: Every day | ORAL | Status: DC
  Filled 2011-07-12: qty 7

## 2011-07-12 MED ORDER — DULOXETINE HCL 60 MG PO CPEP: 60.0000 mg | ORAL_CAPSULE | Freq: Every day | ORAL | Status: DC

## 2011-07-12 MED ORDER — ASPIRIN EC 81 MG PO TBEC
81.0000 mg | DELAYED_RELEASE_TABLET | Freq: Every day | ORAL | Status: DC
  Filled 2011-07-12: qty 7

## 2011-07-12 MED ORDER — HYDROCODONE-ACETAMINOPHEN 5-325 MG PO TABS: 1.0000 | ORAL_TABLET | Freq: Two times a day (BID) | ORAL | Status: AC | PRN

## 2011-07-12 MED ORDER — SENNOSIDES-DOCUSATE SODIUM 8.6-50 MG PO TABS
1.0000 | ORAL_TABLET | Freq: Every day | ORAL | Status: DC
  Filled 2011-07-12: qty 7

## 2011-07-12 MED ORDER — DOCUSATE SODIUM 100 MG PO CAPS: 100.0000 mg | ORAL_CAPSULE | Freq: Two times a day (BID) | ORAL | Status: DC

## 2011-07-12 MED ORDER — DILTIAZEM HCL ER 240 MG PO CP24
240.0000 mg | ORAL_CAPSULE | Freq: Every day | ORAL | Status: DC
  Filled 2011-07-12 (×2): qty 7

## 2011-07-12 MED ORDER — TOPIRAMATE 100 MG PO TABS: 100.0000 mg | ORAL_TABLET | Freq: Two times a day (BID) | ORAL | Status: DC

## 2011-07-12 MED ORDER — SENNOSIDES-DOCUSATE SODIUM 8.6-50 MG PO TABS: ORAL_TABLET | ORAL | Status: DC

## 2011-07-12 MED ORDER — FLUTICASONE-SALMETEROL 250-50 MCG/DOSE IN AEPB: 1.0000 | INHALATION_SPRAY | Freq: Two times a day (BID) | RESPIRATORY_TRACT | Status: DC

## 2011-07-12 MED ORDER — CALCIUM CARBONATE-VITAMIN D 600-400 MG-UNIT PO TABS: 1.0000 | ORAL_TABLET | Freq: Two times a day (BID) | ORAL | Status: DC

## 2011-07-12 MED ORDER — PERPHENAZINE 4 MG PO TABS
8.0000 mg | ORAL_TABLET | ORAL | Status: DC
  Filled 2011-07-12 (×3): qty 28

## 2011-07-12 MED ORDER — VITAMIN D 1000 UNITS PO TABS: 1000.0000 [IU] | ORAL_TABLET | Freq: Every day | ORAL | Status: DC

## 2011-07-12 MED ORDER — PERPHENAZINE 8 MG PO TABS: 8.0000 mg | ORAL_TABLET | ORAL | Status: DC

## 2011-07-12 MED ORDER — DIVALPROEX SODIUM ER 500 MG PO TB24
500.0000 mg | ORAL_TABLET | Freq: Two times a day (BID) | ORAL | Status: DC
  Filled 2011-07-12 (×2): qty 14

## 2011-07-12 MED ORDER — DILTIAZEM HCL ER 240 MG PO CP24: 240.0000 mg | ORAL_CAPSULE | Freq: Every day | ORAL | Status: DC

## 2011-07-12 MED ORDER — OMEPRAZOLE 20 MG PO CPDR: 20.0000 mg | DELAYED_RELEASE_CAPSULE | Freq: Every day | ORAL | Status: DC

## 2011-07-12 MED ORDER — CLONAZEPAM 0.5 MG PO TABS
0.5000 mg | ORAL_TABLET | ORAL | Status: DC
  Filled 2011-07-12 (×2): qty 14

## 2011-07-12 MED ORDER — DIVALPROEX SODIUM ER 500 MG PO TB24: 500.0000 mg | ORAL_TABLET | Freq: Two times a day (BID) | ORAL | Status: DC

## 2011-07-12 NOTE — Progress Notes (Signed)
Pt was up in dayroom upon first assessment.  She did fill out her self-inventory and rated both her depression and hopelessness a 0 today.  She did rate her anxiety about a 2 she said,"I am a little nervous about the interview today, I hope that will accept me"  Allowed time for her talk and vent for a bit.  She did request Ibuprofen for her back this morning at 0741 so given.  She is appropriate and not quite as intrusive thus far today.  She denies any S/H ideations or A/V hallucinations.  She did state,"I have more hope today than I have had in a while" Encouraged her to keep a positive attitude.

## 2011-07-12 NOTE — Discharge Planning (Signed)
Met with patient in Aftercare Planning Group.   She is excited about meeting with Natalie Thomas from [facility] today.  She stated that the meeting held yesterday with her Guardian ad Litem concerning guardianship resulted in him telling her that he will be recommending guardianship.  Case Manager continued to reassure her of the wisdom of this.  She accepted this, and wanted to make sure that she will be taken to the 07/17/11 hearing regardless of where she is staying at that time.   Per State Regulation 482.30  This chart was reviewed for medical necessity with respect to the patient's Admission/Duration of stay.   Next review due:  07/15/11   Ambrose Mantle, LCSW  07/12/2011  9:34 AM

## 2011-07-12 NOTE — Progress Notes (Signed)
Patient ID: Natalie Thomas, female   DOB: 02/06/1961, 51 y.o.   MRN: [id number] Patient polite and appropriate this evening. Denies SI/HI/AV. Patient in bed upon assessment. Patient states that she is having a great day with no complaints or concerns. Patient states that she is very excited because it is planned for her to be discharged tomorrow.

## 2011-07-12 NOTE — Progress Notes (Signed)
[facility] MD Progress Note  07/12/2011 1:32 PM  Diagnosis:  Axis I: Schizoaffective Disorder - Bipolar Type.   The patient was seen today and reports the following:   ADL's: Intact.  Sleep: The patient reports to continuing to sleep well at night.  Appetite: The patient reports a good appetite.   Mild>(1-10) >Severe  Hopelessness (1-10): 0  Depression (1-10): 0  Anxiety (1-10): 0   Suicidal Ideation: The patient continues to adamantly denies any suicidal ideations today.  Plan: No  Intent: No  Means: No   Homicidal Ideation: The patient adamantly denies any homicidal ideations today.  Plan: No  Intent: No.  Means: No   General Appearance /Behavior: Casual and cooperative.  Eye Contact: Good.  Speech: Appropriate in rate and volume.  Motor Behavior: Appropriate.  Level of Consciousness: Alert and Oriented x 3.  Mental Status: Alert and Oriented x 3.  Mood: Essentially Euthymic.  Affect: Mildly Constricted.  Anxiety Level: None reported or observed.  Thought Process: WNL.  Thought Content: The patient denies any auditory or visual hallucinations or delusional thinking today.  Perception:. wnl.  Judgment: Fair to Good.  Insight: Fair to Good.  Cognition: Orientation time, place and person.  Sleep:  Number of Hours: 6.25   Vital Signs:Blood pressure 121/73, pulse 85, temperature 98 F (36.7 C), temperature source Oral, resp. rate 18, height 5\' 1"  (1.549 m), weight 89.812 kg (198 lb), SpO2 97.00%.  Lab Results: No results found for this or any previous visit (from the past 48 hour(s)).  Time was spent discussing with the patient her recent interview with an ALF.  The patient reports that she feels it "went well" and hopes she is accepted.  It was explained that once placement is located she can be discharged.  Treatment Plan Summary:  1. Daily contact with patient to assess and evaluate symptoms and progress in treatment  2. Medication management  3. The patient will deny  suicidal ideations or homicidal ideations for 48 hours prior to discharge and have a depression and anxiety rating of 3 or less. The patient will also deny any auditory or visual hallucinations or delusional thinking.   Plan:  1. Will continue current medications.  2. Will continue to monitor.  3. Will discharge once placement is found.  Natalie Thomas 07/12/2011, 1:32 PM

## 2011-07-13 MED ORDER — VITAMIN D3 25 MCG (1000 UNIT) PO TABS
1000.0000 [IU] | ORAL_TABLET | Freq: Every day | ORAL | Status: DC
  Filled 2011-07-13 (×2): qty 7

## 2011-07-13 MED ORDER — CALCIUM CARBONATE-VITAMIN D 500-200 MG-UNIT PO TABS
1.0000 | ORAL_TABLET | Freq: Two times a day (BID) | ORAL | Status: DC
  Filled 2011-07-13 (×3): qty 7

## 2011-07-13 MED ORDER — PRAVASTATIN SODIUM 40 MG PO TABS
40.0000 mg | ORAL_TABLET | Freq: Every day | ORAL | Status: DC
  Filled 2011-07-13 (×2): qty 7

## 2011-07-13 MED ORDER — DULOXETINE HCL 30 MG PO CPEP: ORAL_CAPSULE | ORAL | Status: DC

## 2011-07-13 MED ORDER — ACETAMINOPHEN 325 MG PO TABS: 650.0000 mg | ORAL_TABLET | Freq: Four times a day (QID) | ORAL | Status: DC | PRN

## 2011-07-13 MED ORDER — DULOXETINE HCL 30 MG PO CPEP
30.0000 mg | ORAL_CAPSULE | Freq: Every day | ORAL | Status: DC
  Administered 2011-07-13: 30 mg via ORAL
  Filled 2011-07-13: qty 1

## 2011-07-13 MED ORDER — DOCUSATE SODIUM 100 MG PO CAPS
100.0000 mg | ORAL_CAPSULE | Freq: Two times a day (BID) | ORAL | Status: DC
  Filled 2011-07-13 (×3): qty 14

## 2011-07-13 NOTE — Progress Notes (Signed)
[facility] Group Notes:  (Counselor/Nursing/MHT/Case Management/Adjunct)  07/13/2011 12:20 PM  Type of Therapy:  Group Therapy  Participation Level:  Active  Participation Quality:  Appropriate  Affect:  Appropriate  Cognitive:  Oriented  Insight:  Limited  Engagement in Group:  Good  Engagement in Therapy:  Good  Modes of Intervention:  Clarification, Education, Problem-solving and Support  Summary of Progress/Problems: Patient was happy that she was accepted at group home. Feeling positive. Plans to return with [facility] but wants to wait a little while since she will be getting accustomed to new group home. Encouraged her not to take too big a break because they could help her with the adjustment. She is worried about being with residents from [facility] where she lived earlier. Hoping they will treat he right.   Craigory Toste, Aram Beecham 07/13/2011, 12:20 PM

## 2011-07-13 NOTE — Progress Notes (Signed)
Recreation Therapy Notes  07/13/2011         Time: 1100      Group Topic/Focus: The focus of the group is on enhancing the patients' ability to cope with stressors by understanding what coping is, why it is important, the negative effects of stress and developing healthier coping skills. Patients practice [place] and discuss how exercise can be used as a healthy coping strategy.  Participation Level: Active  Participation Quality: Appropriate  Affect: Appropritate  Cognitive: Oriented   Additional Comments: Patient reports her back is bothering her, participated in exercises with modifications.   Piper Albro 07/13/2011 11:58 AM

## 2011-07-13 NOTE — Progress Notes (Signed)
Suicide Risk Assessment  Discharge Assessment     Demographic factors:  Assessment Details Time of Assessment: Admission Information Obtained From: Patient Current Mental Status:  Current Mental Status:  (Denies SI/HI) Risk Reduction Factors:  Risk Reduction Factors: Positive social support;Living with another person, especially a relative  CLINICAL FACTORS:   Chronic Pain Previous Psychiatric Diagnoses and Treatments Medical Diagnoses and Treatments/Surgeries Schizoaffective Disorder - Bipolar Type.  COGNITIVE FEATURES THAT CONTRIBUTE TO RISK:  None Noted.  Diagnosis:  Axis I: Schizoaffective Disorder - Bipolar Type.   The patient was seen today and reports the following:   ADL's: Intact.  Sleep: The patient reports to continuing to sleep well at night.  Appetite: The patient continues to report a good appetite.   Mild>(1-10) >Severe  Hopelessness (1-10): 0  Depression (1-10): 0  Anxiety (1-10): 0   Suicidal Ideation: The patient continues to adamantly denies any suicidal ideations today.  Plan: No  Intent: No  Means: No   Homicidal Ideation: The patient adamantly denies any homicidal ideations today.  Plan: No  Intent: No.  Means: No   General Appearance /Behavior: Casual and cooperative.  Eye Contact: Good.  Speech: Appropriate in rate and volume.  Motor Behavior: Appropriate.  Level of Consciousness: Alert and Oriented x 3.  Mental Status: Alert and Oriented x 3.  Mood: Essentially Euthymic.  Affect: Mildly Constricted.  Anxiety Level: None reported or observed.  Thought Process: WNL.  Thought Content: The patient denies any auditory or visual hallucinations or delusional thinking today.  Perception:. wnl.  Judgment: Fair to Good.  Insight: Fair to Good.  Cognition: Orientation time, place and person.   Treatment Plan Summary:  1. Daily contact with patient to assess and evaluate symptoms and progress in treatment  2. Medication management  3. The  patient will deny suicidal ideations or homicidal ideations for 48 hours prior to discharge and have a depression and anxiety rating of 3 or less. The patient will also deny any auditory or visual hallucinations or delusional thinking.   Plan:  1. Will continue current medications.  2. Will continue to monitor.  3. Discharge today to her ALF and to outpatient follow up.  SUICIDE RISK:   Minimal: No identifiable suicidal ideation.  Patients presenting with no risk factors but with morbid ruminations; may be classified as minimal risk based on the severity of the depressive symptoms  Natalie Thomas 07/13/2011, 11:51 AM

## 2011-07-13 NOTE — Progress Notes (Signed)
[facility] Group Notes:  (Counselor/Nursing/MHT/Case Management/Adjunct)  07/13/2011 12:19 PM  Type of Therapy:  group Therapy 07/12/2011  Participation Level:  Active  Participation Quality:  Appropriate  Affect:  Appropriate  Cognitive:  Oriented  Insight:  Limited  Engagement in Group:  Good  Engagement in Therapy:  Good  Modes of Intervention:  Clarification, Education and Support  Summary of Progress/Problems: Patient continues to be hopeful of placement. Waiting on another interview. Talked about positive supports.   HartisAram Beecham 07/13/2011, 12:19 PM

## 2011-07-13 NOTE — Progress Notes (Signed)
Patient ID: Natalie Thomas, female   DOB: [birth year], 51 y.o.   MRN: [id number] 07-13-11 shift note: pt had a problem with back pain . She was given medication and it was effective. On her inventory sheet she wrote slept well, appetite good, energy normal, attention good and depression at 0. She stated she feels hopefull. No suicide ideation. He pain goal is 0 or 1. When she goes home she plans to eat right, lose weight, take medications and go to psr. She see no problem staying on medications after discharge. RN will monitor and safety checks continue every 15 minutes.

## 2011-07-13 NOTE — Tx Team (Signed)
Interdisciplinary Treatment Plan Update (Adult)  Date:  07/13/2011  Time Reviewed:  10:17 AM   Progress in Treatment: Attending groups:  Yes Participating in groups:  Yes   Taking medication as prescribed:    Yes Tolerating medication:   Yes Family/Significant other contact made:  Yes Patient understands diagnosis:   Yes Discussing patient identified problems/goals with staff:   Yes Medical problems stabilized or resolved:   Yes Denies suicidal/homicidal ideation:  Yes Issues/concerns per patient self-inventory:   None Other:  New problem(s) identified: No, Describe:    Reason for Continuation of Hospitalization: None  Interventions implemented related to continuation of hospitalization:  Medication monitoring and adjustment, safety checks Q15 min., group therapy, psychoeducation, collateral contact - until discharge  Additional comments:  Not applicable  Estimated length of stay:  Discharge today  Discharge Plan:  Go to [facility], follow up with [name], wants to stay at the home for a few days prior to going to [facility]  New goal(s):  Not applicable  Review of initial/current patient goals per problem list:   1.  Goal(s):  Reduce depression from 10 to 3.  Met:  Yes  Target date:  By Discharge   As evidenced by:  "0" today, no suicidal thoughts  2.  Goal(s):  Medication stabilization for mental health diagnosis.  Met:  Yes  Target date:  By Discharge   As evidenced by:  Stabilized, no symptoms, no voices  3.  Goal(s):  Determine placement at discharge.  Met:  Yes  Target date:  By Discharge   As evidenced by:  Going to [facility]  :  Attendees: Patient:  Natalie Thomas  07/13/2011 10:57 AM   Family:     Physician:  Dr. Harvie Heck Readling 07/13/2011 10:57 AM   Nursing:   Isaac Laud, RN 07/13/2011   10:57 AM   Case Manager:  Ambrose Mantle, LCSW 07/13/2011  10:57 AM   Counselor:  Veto Kemps, MT-BC 07/13/2011  10:57 AM   Other:       Other:      Other:      Other:       Scribe for Treatment Team:   Sarina Ser, 07/13/2011, 10:17 AM

## 2011-07-13 NOTE — Progress Notes (Signed)
[facility] Case Management Discharge Plan:  Will you be returning to the same living situation after discharge: No.  Going to a new group home. At discharge, do you have transportation home?:Yes,  [facility] to pick up Do you have the ability to pay for your medications:Yes,  income and insurance  Interagency Information:     Release of information consent forms completed and in the chart;  Patient's signature needed at discharge.  Patient to Follow up at:  Follow-up Information    Follow up with [name] on 07/19/2011. (1:45PM appointment with Stevphen Rochester, NP)    Contact information:   [address], [place]  [address] Telephone:  [phone] Fax:  [phone]      Follow up with PSR at [facility]. Call on 07/17/2011. (Call PSR Director Tana Conch to discuss return to Psychosocial Rehabilitation program on 07/19/11)    Contact information:   [address], [place]  [address] Telephone:  [phone] Fax:  [phone]      Follow up with Court Hearing about Interim Guardianship on 07/17/2011. (2:00PM hearing)    Contact information:   [facility] [address] Courtroom  [place]  [address] Guardian ad Litem is Salena Saner [phone]         Patient denies SI/HI:   Yes,      Safety Planning and Suicide Prevention discussed:  Yes,    During multiple Aftercare Planning Groups, Case Manager provided psychoeducation on "Suicide Prevention Information."  This included descriptions of risk factors for suicide, warning signs that an individual is in crisis and thinking of suicide, and what to do if this occurs.  Pt indicated understanding of information provided, and will read brochure given upon discharge.     Barrier to discharge identified:No.  Summary and Recommendations:  Patient to stay home from her PSR several days to adjust to her new home, then return after her 1/24  appointment with her medication manager.   Sarina Ser 07/13/2011, 2:12 PM

## 2011-07-13 NOTE — Progress Notes (Signed)
[facility] Group Notes:  (Counselor/Nursing/MHT/Case Management/Adjunct)  07/13/2011 12:18 PM  Type of Therapy:  Psychoeducational Skills 07/11/2011  Participation Level:  Active  Patient was attentive to speaker from mental health association. She identified with several symptoms he described such as hallucinations and feeling that people didn't understand her.   [name] 07/13/2011, 12:18 PM

## 2011-07-13 NOTE — Progress Notes (Signed)
Discharge Note:   Patient discharged to  [facility].   Driver from [place] picked up patient to take her to [place].   Driver received all discharge instructions, medications, prescriptions, belongings, clothing, etc.  In patient's locker were her black purse, cigarettes, makeup, perfume, one gold color lighter.  Patient stated she thought she had a blue cigarette lighter but could not remember if it had been in her purse.   Encouraged patient to not smoke since she had not smoked in almost 2 weeks of entering [facility].  Suicide prevention information was given to patient, discussed with patient, who stated she understood and had no questions.   Patient denied SI and HI.   Denied A/V hallucinations.   Denied pain.   Patient stated she appreciated all the staff has done to help her at [facility].

## 2011-07-16 ENCOUNTER — Ambulatory Visit: Payer: Medicare Other | Admitting: Endocrinology

## 2011-07-16 DIAGNOSIS — Z0289 Encounter for other administrative examinations: Secondary | ICD-10-CM

## 2011-07-16 NOTE — Progress Notes (Signed)
Patient Discharge Instructions:  Admission Note Faxed,  07/16/2011 After Visit Summary Faxed,  07/16/2011 Faxed to the Next Level Care provider:  07/16/2011 Facesheet faxed 07/16/2011   Faxed to [facility] - Stevphen Rochester, NP, and Psychosocial Rehab @ [phone]  Wandra Scot, 07/16/2011, 5:37 PM
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
## 2011-07-18 ENCOUNTER — Encounter
Payer: Medicare Other | Attending: Physical Medicine and Rehabilitation | Admitting: Physical Medicine and Rehabilitation

## 2011-07-20 ENCOUNTER — Emergency Department (HOSPITAL_COMMUNITY)
Admission: EM | Admit: 2011-07-20 | Discharge: 2011-07-24 | Disposition: A | Discharge status: Other Acute Inpatient Hospital | Payer: Medicare Other | Source: Home / Self Care

## 2011-07-20 ENCOUNTER — Encounter (HOSPITAL_COMMUNITY): Payer: Self-pay | Admitting: Emergency Medicine

## 2011-07-20 DIAGNOSIS — M810 Age-related osteoporosis without current pathological fracture: Secondary | ICD-10-CM | POA: Insufficient documentation

## 2011-07-20 DIAGNOSIS — J449 Chronic obstructive pulmonary disease, unspecified: Secondary | ICD-10-CM | POA: Insufficient documentation

## 2011-07-20 DIAGNOSIS — F329 Major depressive disorder, single episode, unspecified: Secondary | ICD-10-CM | POA: Insufficient documentation

## 2011-07-20 DIAGNOSIS — F200 Paranoid schizophrenia: Secondary | ICD-10-CM | POA: Insufficient documentation

## 2011-07-20 DIAGNOSIS — F17200 Nicotine dependence, unspecified, uncomplicated: Secondary | ICD-10-CM | POA: Insufficient documentation

## 2011-07-20 DIAGNOSIS — Z79899 Other long term (current) drug therapy: Secondary | ICD-10-CM | POA: Insufficient documentation

## 2011-07-20 DIAGNOSIS — J4489 Other specified chronic obstructive pulmonary disease: Secondary | ICD-10-CM | POA: Insufficient documentation

## 2011-07-20 DIAGNOSIS — F250 Schizoaffective disorder, bipolar type: Secondary | ICD-10-CM

## 2011-07-20 DIAGNOSIS — F3289 Other specified depressive episodes: Secondary | ICD-10-CM | POA: Insufficient documentation

## 2011-07-20 DIAGNOSIS — I10 Essential (primary) hypertension: Secondary | ICD-10-CM | POA: Insufficient documentation

## 2011-07-20 DIAGNOSIS — E119 Type 2 diabetes mellitus without complications: Secondary | ICD-10-CM | POA: Insufficient documentation

## 2011-07-20 DIAGNOSIS — K219 Gastro-esophageal reflux disease without esophagitis: Secondary | ICD-10-CM | POA: Insufficient documentation

## 2011-07-20 DIAGNOSIS — Z7982 Long term (current) use of aspirin: Secondary | ICD-10-CM | POA: Insufficient documentation
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
## 2011-07-21 LAB — POCT I-STAT, CHEM 8
BUN: 8 mg/dL (ref 6–23)
Chloride: 103 mEq/L (ref 96–112)
Creatinine, Ser: 0.9 mg/dL (ref 0.50–1.10)
Potassium: 3.4 mEq/L — ABNORMAL LOW (ref 3.5–5.1)
Sodium: 137 mEq/L (ref 135–145)
TCO2: 23 mmol/L (ref 0–100)

## 2011-07-21 LAB — DIFFERENTIAL
Basophils Relative: 0 % (ref 0–1)
Eosinophils Absolute: 0.1 10*3/uL (ref 0.0–0.7)
Eosinophils Relative: 1 % (ref 0–5)
Neutrophils Relative %: 60 % (ref 43–77)

## 2011-07-21 LAB — CBC
MCH: 31.6 pg (ref 26.0–34.0)
MCHC: 35.3 g/dL (ref 30.0–36.0)
Platelets: 232 10*3/uL (ref 150–400)
RDW: 12.9 % (ref 11.5–15.5)

## 2011-07-21 LAB — RAPID URINE DRUG SCREEN, HOSP PERFORMED
Cocaine: NOT DETECTED
Opiates: NOT DETECTED

## 2011-07-21 MED ORDER — ONDANSETRON HCL 4 MG PO TABS: 4.0000 mg | ORAL_TABLET | Freq: Three times a day (TID) | ORAL | Status: DC | PRN

## 2011-07-21 MED ORDER — FLUTICASONE-SALMETEROL 250-50 MCG/DOSE IN AEPB
1.0000 | INHALATION_SPRAY | Freq: Two times a day (BID) | RESPIRATORY_TRACT | Status: DC
  Administered 2011-07-21 – 2011-07-24 (×7): 1 via RESPIRATORY_TRACT
  Filled 2011-07-21: qty 14

## 2011-07-21 MED ORDER — CLONAZEPAM 0.5 MG PO TABS
0.5000 mg | ORAL_TABLET | Freq: Two times a day (BID) | ORAL | Status: DC
  Administered 2011-07-21 – 2011-07-24 (×8): 0.5 mg via ORAL
  Filled 2011-07-21 (×8): qty 1

## 2011-07-21 MED ORDER — DILTIAZEM HCL ER COATED BEADS 240 MG PO CP24
240.0000 mg | ORAL_CAPSULE | Freq: Once | ORAL | Status: AC
  Administered 2011-07-21: 240 mg via ORAL
  Filled 2011-07-21: qty 1

## 2011-07-21 MED ORDER — DULOXETINE HCL 30 MG PO CPEP
30.0000 mg | ORAL_CAPSULE | Freq: Two times a day (BID) | ORAL | Status: DC
  Administered 2011-07-21 – 2011-07-24 (×8): 30 mg via ORAL
  Filled 2011-07-21 (×11): qty 1

## 2011-07-21 MED ORDER — PERPHENAZINE 8 MG PO TABS
8.0000 mg | ORAL_TABLET | Freq: Two times a day (BID) | ORAL | Status: DC
  Administered 2011-07-21 – 2011-07-24 (×8): 8 mg via ORAL
  Filled 2011-07-21 (×11): qty 1

## 2011-07-21 MED ORDER — DILTIAZEM HCL ER COATED BEADS 240 MG PO CP24
240.0000 mg | ORAL_CAPSULE | Freq: Every day | ORAL | Status: DC
  Administered 2011-07-22 – 2011-07-24 (×3): 240 mg via ORAL
  Filled 2011-07-21 (×4): qty 1

## 2011-07-21 MED ORDER — DIVALPROEX SODIUM 500 MG PO DR TAB
500.0000 mg | DELAYED_RELEASE_TABLET | Freq: Once | ORAL | Status: AC
  Administered 2011-07-21: 500 mg via ORAL
  Filled 2011-07-21: qty 1

## 2011-07-21 MED ORDER — IPRATROPIUM-ALBUTEROL 18-103 MCG/ACT IN AERO
2.0000 | INHALATION_SPRAY | Freq: Four times a day (QID) | RESPIRATORY_TRACT | Status: DC | PRN
  Filled 2011-07-21: qty 14.7

## 2011-07-21 MED ORDER — SIMVASTATIN 40 MG PO TABS
40.0000 mg | ORAL_TABLET | Freq: Every day | ORAL | Status: DC
  Filled 2011-07-21 (×2): qty 1

## 2011-07-21 MED ORDER — ROSUVASTATIN CALCIUM 10 MG PO TABS
10.0000 mg | ORAL_TABLET | Freq: Every day | ORAL | Status: DC
  Administered 2011-07-21 – 2011-07-24 (×4): 10 mg via ORAL
  Filled 2011-07-21 (×5): qty 1

## 2011-07-21 MED ORDER — PANTOPRAZOLE SODIUM 40 MG PO TBEC
40.0000 mg | DELAYED_RELEASE_TABLET | Freq: Once | ORAL | Status: AC
  Administered 2011-07-21: 40 mg via ORAL
  Filled 2011-07-21: qty 1

## 2011-07-21 MED ORDER — ASPIRIN 81 MG PO CHEW
81.0000 mg | CHEWABLE_TABLET | Freq: Every day | ORAL | Status: DC
  Administered 2011-07-21 – 2011-07-24 (×4): 81 mg via ORAL
  Filled 2011-07-21 (×4): qty 1

## 2011-07-21 MED ORDER — NICOTINE 21 MG/24HR TD PT24
21.0000 mg | MEDICATED_PATCH | Freq: Every day | TRANSDERMAL | Status: DC
  Administered 2011-07-21 – 2011-07-24 (×4): 21 mg via TRANSDERMAL
  Filled 2011-07-21 (×4): qty 1

## 2011-07-21 MED ORDER — HYDROCODONE-ACETAMINOPHEN 5-325 MG PO TABS
1.0000 | ORAL_TABLET | Freq: Four times a day (QID) | ORAL | Status: DC | PRN
  Administered 2011-07-21 – 2011-07-24 (×6): 1 via ORAL
  Filled 2011-07-21 (×6): qty 1

## 2011-07-21 MED ORDER — DIVALPROEX SODIUM 500 MG PO DR TAB
500.0000 mg | DELAYED_RELEASE_TABLET | Freq: Every day | ORAL | Status: DC
  Administered 2011-07-22 – 2011-07-24 (×3): 500 mg via ORAL
  Filled 2011-07-21 (×3): qty 1

## 2011-07-21 MED ORDER — ACETAMINOPHEN 325 MG PO TABS: 650.0000 mg | ORAL_TABLET | ORAL | Status: DC | PRN

## 2011-07-21 NOTE — ED Notes (Signed)
awaiting meds to come from pharmacy

## 2011-07-21 NOTE — ED Notes (Signed)
Up to the bathroom 
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
## 2011-07-21 NOTE — ED Notes (Signed)
NP at bedside.

## 2011-07-21 NOTE — BH Assessment (Signed)
TELEPSYCH HAS DISPOSITIONED PT BE HOSPITALIZED. AC([facility]) WAS ALL NOTIFIED & PT IS NOT PENDING A 400 HALL BED.

## 2011-07-21 NOTE — ED Notes (Signed)
Holding orders received medications admin. Per orders
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
## 2011-07-21 NOTE — ED Notes (Signed)
[id number]> Expected date:<BR> Expected time:<BR> Means of arrival:<BR> Comments:<BR> Ann Held

## 2011-07-21 NOTE — ED Provider Notes (Signed)
8:48 AM Patient is sleeping, snoring.  The nurse reports no notable events overnight.  Awaiting placement.   Gerhard Munch, MD 07/21/11 [phone]

## 2011-07-22 NOTE — ED Notes (Signed)
Pt requesting to speak w/ EDP-will relay request

## 2011-07-22 NOTE — ED Notes (Addendum)
Pt resting quietly, NAD noted

## 2011-07-22 NOTE — ED Notes (Signed)
Sitting quietly in room reading her bible, drinking coffee-pt reports hip pain 10/10

## 2011-07-22 NOTE — ED Provider Notes (Signed)
History     CSN: [id number]  Arrival date & time 07/20/11  2335   First MD Initiated Contact with Patient 07/21/11 0001      Chief Complaint  Patient presents with  . Psychiatric Evaluation  . Medical Clearance    (Consider location/radiation/quality/duration/timing/severity/associated sxs/prior treatment) HPI  Past Medical History  Diagnosis Date  . COPD (chronic obstructive pulmonary disease)   . Depression   . GERD (gastroesophageal reflux disease)   . Hypertension   . Osteoporosis   . Diabetes mellitus, type 2     Past Surgical History  Procedure Date  . Eye surgery   . Pelvic fracture surgery     History reviewed. No pertinent family history.  History  Substance Use Topics  . Smoking status: Current Everyday Smoker -- 1.0 packs/day    Types: Cigarettes  . Smokeless tobacco: Not on file  . Alcohol Use: No    OB History    Grav Para Term Preterm Abortions TAB SAB Ect Mult Living                  Review of Systems  Allergies  Biaxin and Penicillins  Home Medications   Current Outpatient Rx  Name Route Sig Dispense Refill  . IPRATROPIUM-ALBUTEROL 18-103 MCG/ACT IN AERO Inhalation Inhale 2 puffs into the lungs 4 (four) times daily as needed. For shortness of breath. 1 Inhaler 0  . ASPIRIN 81 MG PO TABS Oral Take 1 tablet (81 mg total) by mouth daily. For heart health 30 tablet 0  . CALCIUM CARBONATE-VITAMIN D 600-400 MG-UNIT PO TABS Oral Take 1 tablet by mouth 2 (two) times daily. For osteoparosis. 60 tablet 0  . VITAMIN D 1000 UNITS PO TABS Oral Take 1 tablet (1,000 Units total) by mouth daily. For osteoparosis. 30 tablet 0  . CLONAZEPAM 0.5 MG PO TABS Oral Take 0.5 mg by mouth 2 (two) times daily.    Marland Kitchen DILTIAZEM HCL ER 240 MG PO CP24 Oral Take 1 capsule (240 mg total) by mouth daily. For blood pressure and stable heart rhythm 30 capsule 0  . DIVALPROEX SODIUM ER 500 MG PO TB24 Oral Take 1 tablet (500 mg total) by mouth 2 (two) times daily. For mood  stability 60 tablet 0  . DOCUSATE SODIUM 100 MG PO CAPS Oral Take 100 mg by mouth 2 (two) times daily.    . DULOXETINE HCL 30 MG PO CPEP  Take 2 capsules (60mg ) by mouth every morning and 1 capsule (30mg ) daily at 2 PM for depression/pain 90 capsule 0  . FLUTICASONE-SALMETEROL 250-50 MCG/DOSE IN AEPB Inhalation Inhale 1 puff into the lungs every 12 (twelve) hours. For copd 60 each 0  . HYDROCODONE-ACETAMINOPHEN 5-325 MG PO TABS Oral Take 1 tablet by mouth 2 (two) times daily as needed for pain (for chronic back pain). 30 tablet 0  . OMEPRAZOLE 20 MG PO CPDR Oral Take 1 capsule (20 mg total) by mouth daily. For acid reflux. 30 capsule 0  . PERPHENAZINE 8 MG PO TABS Oral Take 1 tablet (8 mg total) by mouth 2 (two) times daily in the am and at bedtime.. For clear thoughts. 60 tablet 0  . PRAVASTATIN SODIUM 40 MG PO TABS Oral Take 1 tablet (40 mg total) by mouth at bedtime. For cholesterol. 30 tablet 0  . SENNOSIDES-DOCUSATE SODIUM 8.6-50 MG PO TABS Oral Take 1 tablet by mouth daily. Take one daily.For bowel regularity    . CLONAZEPAM 1 MG PO TABS Oral Take 1 mg  by mouth daily. For anxiety      BP 138/75  Pulse 69  Temp(Src) 98 F (36.7 C) (Oral)  Resp 18  SpO2 97%  Physical Exam  ED Course  Procedures (including critical care time)  Labs Reviewed  POCT I-STAT, CHEM 8 - Abnormal; Notable for the following:    Potassium 3.4 (*)    Glucose, Bld 139 (*)    All other components within normal limits  CBC  DIFFERENTIAL  URINE RAPID DRUG SCREEN (HOSP PERFORMED)  I-STAT, CHEM 8   No results found.   No diagnosis found.    MDM  schizophrenia        Arman Filter, NP 07/22/11 0606  Arman Filter, NP 07/22/11 [phone]  Medical screening examination/treatment/procedure(s) were performed by non-physician practitioner and as supervising physician I was immediately available for consultation/collaboration.   Sunnie Nielsen, MD 07/24/11 1106
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
## 2011-07-22 NOTE — ED Notes (Signed)
Up tot he bathroom to shower and change scrubs 
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
## 2011-07-22 NOTE — ED Notes (Signed)
Noreene Larsson with respiratory in to administer Advair.

## 2011-07-22 NOTE — [facility] Counselor (Signed)
Alma who is Interior and spatial designer of [facility] informed Rolena Infante pt was not able to return to facility.  This assessor informed pt per Rolena Infante request.  Pt would like to be put up in a hotel tomorrow if possible, but admits she cannot afford it.  ACT informed pt she would be seen by SW in the AM to discuss placement options.  Dr. Elsie Saas at [facility] declined pt due to chronic issues and meeting criteria for placement at this time.  Pt needs long term care solution which appears to be at the root of her ER visits.  Pt was cooperative and did not endorse any SI/HI issues or concerns.  Pt did appear sad about news of not returning to placement.

## 2011-07-22 NOTE — ED Notes (Signed)
Linens changed.
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
## 2011-07-23 NOTE — ED Notes (Signed)
Offered patient a shower. Patient declined stating she had one yesterday. Encouraged pt to let me know if she changed her mind. Offered to change sheets but patient stated her sheets were clean.

## 2011-07-23 NOTE — ED Notes (Signed)
CSW has completed pt's FL2 and sent information out via care finder [facility]. Pt is currently awaiting placement. Oncoming CSW to follow up regarding possible bed offers for the pt.

## 2011-07-24 ENCOUNTER — Encounter (HOSPITAL_COMMUNITY): Payer: Self-pay

## 2011-07-24 ENCOUNTER — Inpatient Hospital Stay (HOSPITAL_COMMUNITY)
Admission: AD | Admit: 2011-07-24 | Discharge: 2011-08-06 | DRG: 885 | Disposition: A | Discharge status: Home / Self Care | Payer: Medicare Other | Source: Ambulatory Visit | Attending: Psychiatry | Admitting: Psychiatry

## 2011-07-24 DIAGNOSIS — G8929 Other chronic pain: Secondary | ICD-10-CM

## 2011-07-24 DIAGNOSIS — K219 Gastro-esophageal reflux disease without esophagitis: Secondary | ICD-10-CM

## 2011-07-24 DIAGNOSIS — Z888 Allergy status to other drugs, medicaments and biological substances status: Secondary | ICD-10-CM

## 2011-07-24 DIAGNOSIS — R631 Polydipsia: Secondary | ICD-10-CM

## 2011-07-24 DIAGNOSIS — J4489 Other specified chronic obstructive pulmonary disease: Secondary | ICD-10-CM

## 2011-07-24 DIAGNOSIS — Z79899 Other long term (current) drug therapy: Secondary | ICD-10-CM

## 2011-07-24 DIAGNOSIS — E119 Type 2 diabetes mellitus without complications: Secondary | ICD-10-CM

## 2011-07-24 DIAGNOSIS — Z880 Allergy status to penicillin: Secondary | ICD-10-CM

## 2011-07-24 DIAGNOSIS — M549 Dorsalgia, unspecified: Secondary | ICD-10-CM

## 2011-07-24 DIAGNOSIS — E871 Hypo-osmolality and hyponatremia: Secondary | ICD-10-CM

## 2011-07-24 DIAGNOSIS — F259 Schizoaffective disorder, unspecified: Principal | ICD-10-CM

## 2011-07-24 DIAGNOSIS — F319 Bipolar disorder, unspecified: Secondary | ICD-10-CM

## 2011-07-24 DIAGNOSIS — R45851 Suicidal ideations: Secondary | ICD-10-CM

## 2011-07-24 DIAGNOSIS — F609 Personality disorder, unspecified: Secondary | ICD-10-CM

## 2011-07-24 DIAGNOSIS — J449 Chronic obstructive pulmonary disease, unspecified: Secondary | ICD-10-CM

## 2011-07-24 DIAGNOSIS — I10 Essential (primary) hypertension: Secondary | ICD-10-CM

## 2011-07-24 DIAGNOSIS — M810 Age-related osteoporosis without current pathological fracture: Secondary | ICD-10-CM

## 2011-07-24 DIAGNOSIS — F250 Schizoaffective disorder, bipolar type: Secondary | ICD-10-CM | POA: Diagnosis present

## 2011-07-24 DIAGNOSIS — Z7982 Long term (current) use of aspirin: Secondary | ICD-10-CM

## 2011-07-24 DIAGNOSIS — F17200 Nicotine dependence, unspecified, uncomplicated: Secondary | ICD-10-CM

## 2011-07-24 LAB — GLUCOSE, CAPILLARY: Glucose-Capillary: 97 mg/dL (ref 70–99)

## 2011-07-24 LAB — VALPROIC ACID LEVEL: Valproic Acid Lvl: 54.4 ug/mL (ref 50.0–100.0)

## 2011-07-24 MED ORDER — PERPHENAZINE 4 MG PO TABS
8.0000 mg | ORAL_TABLET | ORAL | Status: DC
  Administered 2011-07-24 – 2011-08-06 (×26): 8 mg via ORAL
  Filled 2011-07-24 (×3): qty 2
  Filled 2011-07-24: qty 10
  Filled 2011-07-24 (×8): qty 2
  Filled 2011-07-24: qty 10
  Filled 2011-07-24 (×16): qty 2

## 2011-07-24 MED ORDER — DULOXETINE HCL 60 MG PO CPEP
60.0000 mg | ORAL_CAPSULE | Freq: Every day | ORAL | Status: DC
  Administered 2011-07-25 – 2011-08-04 (×12): 60 mg via ORAL
  Filled 2011-07-24 (×11): qty 1

## 2011-07-24 MED ORDER — MAGNESIUM HYDROXIDE 400 MG/5ML PO SUSP: 30.0000 mL | Freq: Every day | ORAL | Status: DC | PRN

## 2011-07-24 MED ORDER — PANTOPRAZOLE SODIUM 40 MG PO TBEC: 40.0000 mg | DELAYED_RELEASE_TABLET | Freq: Every day | ORAL | Status: DC

## 2011-07-24 MED ORDER — SIMVASTATIN 20 MG PO TABS: 20.0000 mg | ORAL_TABLET | Freq: Every day | ORAL | Status: DC

## 2011-07-24 MED ORDER — POTASSIUM CHLORIDE CRYS ER 10 MEQ PO TBCR
10.0000 meq | EXTENDED_RELEASE_TABLET | Freq: Two times a day (BID) | ORAL | Status: AC
  Administered 2011-07-24 – 2011-07-25 (×3): 10 meq via ORAL
  Filled 2011-07-24 (×2): qty 1

## 2011-07-24 MED ORDER — IPRATROPIUM-ALBUTEROL 18-103 MCG/ACT IN AERO: 2.0000 | INHALATION_SPRAY | Freq: Four times a day (QID) | RESPIRATORY_TRACT | Status: DC | PRN

## 2011-07-24 MED ORDER — PERPHENAZINE 8 MG PO TABS
8.0000 mg | ORAL_TABLET | Freq: Three times a day (TID) | ORAL | Status: DC
  Administered 2011-07-24: 8 mg via ORAL
  Filled 2011-07-24 (×2): qty 1

## 2011-07-24 MED ORDER — CLONAZEPAM 0.5 MG PO TABS
0.5000 mg | ORAL_TABLET | ORAL | Status: DC
  Administered 2011-07-24 – 2011-08-06 (×26): 0.5 mg via ORAL
  Filled 2011-07-24 (×4): qty 1
  Filled 2011-07-24 (×2): qty 5
  Filled 2011-07-24 (×9): qty 1
  Filled 2011-07-24: qty 5
  Filled 2011-07-24 (×4): qty 1
  Filled 2011-07-24: qty 5
  Filled 2011-07-24 (×9): qty 1

## 2011-07-24 MED ORDER — DOCUSATE SODIUM 100 MG PO CAPS
100.0000 mg | ORAL_CAPSULE | ORAL | Status: DC
  Administered 2011-07-24 – 2011-08-06 (×26): 100 mg via ORAL
  Filled 2011-07-24 (×13): qty 1
  Filled 2011-07-24: qty 5
  Filled 2011-07-24 (×5): qty 1
  Filled 2011-07-24: qty 5
  Filled 2011-07-24 (×11): qty 1

## 2011-07-24 MED ORDER — SIMVASTATIN 20 MG PO TABS
20.0000 mg | ORAL_TABLET | Freq: Every day | ORAL | Status: DC
  Administered 2011-07-24 – 2011-08-05 (×13): 20 mg via ORAL
  Filled 2011-07-24 (×10): qty 1
  Filled 2011-07-24: qty 3
  Filled 2011-07-24 (×4): qty 1

## 2011-07-24 MED ORDER — FLUTICASONE-SALMETEROL 250-50 MCG/DOSE IN AEPB
1.0000 | INHALATION_SPRAY | Freq: Two times a day (BID) | RESPIRATORY_TRACT | Status: DC
  Administered 2011-07-24 – 2011-08-06 (×26): 1 via RESPIRATORY_TRACT
  Filled 2011-07-24 (×4): qty 14

## 2011-07-24 MED ORDER — SENNOSIDES-DOCUSATE SODIUM 8.6-50 MG PO TABS
1.0000 | ORAL_TABLET | Freq: Every day | ORAL | Status: DC
  Administered 2011-07-25 – 2011-08-06 (×8): 1 via ORAL
  Filled 2011-07-24 (×8): qty 1
  Filled 2011-07-24: qty 2
  Filled 2011-07-24 (×5): qty 1

## 2011-07-24 MED ORDER — CALCIUM CARBONATE-VITAMIN D 500-200 MG-UNIT PO TABS
1.0000 | ORAL_TABLET | ORAL | Status: DC
  Administered 2011-07-24 – 2011-08-06 (×39): 1 via ORAL
  Filled 2011-07-24 (×26): qty 1
  Filled 2011-07-24: qty 8
  Filled 2011-07-24 (×2): qty 1
  Filled 2011-07-24: qty 8
  Filled 2011-07-24 (×10): qty 1
  Filled 2011-07-24: qty 8
  Filled 2011-07-24 (×2): qty 1

## 2011-07-24 MED ORDER — ASPIRIN 81 MG PO CHEW
81.0000 mg | CHEWABLE_TABLET | Freq: Every day | ORAL | Status: DC
  Administered 2011-07-25 – 2011-08-06 (×13): 81 mg via ORAL
  Filled 2011-07-24 (×10): qty 1
  Filled 2011-07-24: qty 2
  Filled 2011-07-24 (×3): qty 1

## 2011-07-24 MED ORDER — ACETAMINOPHEN 325 MG PO TABS: 650.0000 mg | ORAL_TABLET | Freq: Four times a day (QID) | ORAL | Status: DC | PRN

## 2011-07-24 MED ORDER — DIVALPROEX SODIUM ER 500 MG PO TB24
500.0000 mg | ORAL_TABLET | ORAL | Status: DC
  Administered 2011-07-24 – 2011-07-25 (×2): 500 mg via ORAL
  Filled 2011-07-24 (×3): qty 1

## 2011-07-24 MED ORDER — DULOXETINE HCL 30 MG PO CPEP
30.0000 mg | ORAL_CAPSULE | Freq: Every day | ORAL | Status: DC
  Filled 2011-07-24: qty 1

## 2011-07-24 MED ORDER — DOCUSATE SODIUM 100 MG PO CAPS: 100.0000 mg | ORAL_CAPSULE | Freq: Two times a day (BID) | ORAL | Status: DC

## 2011-07-24 MED ORDER — PANTOPRAZOLE SODIUM 40 MG PO TBEC
40.0000 mg | DELAYED_RELEASE_TABLET | Freq: Every day | ORAL | Status: DC
  Administered 2011-07-24 – 2011-08-05 (×12): 40 mg via ORAL
  Filled 2011-07-24 (×3): qty 1
  Filled 2011-07-24: qty 3
  Filled 2011-07-24 (×12): qty 1

## 2011-07-24 MED ORDER — CALCIUM CARBONATE-VITAMIN D 500-200 MG-UNIT PO TABS: 1.0000 | ORAL_TABLET | Freq: Three times a day (TID) | ORAL | Status: DC

## 2011-07-24 MED ORDER — VITAMIN D3 25 MCG (1000 UNIT) PO TABS
1000.0000 [IU] | ORAL_TABLET | Freq: Every day | ORAL | Status: DC
  Administered 2011-07-25 – 2011-08-06 (×13): 1000 [IU] via ORAL
  Filled 2011-07-24 (×13): qty 1
  Filled 2011-07-24: qty 2
  Filled 2011-07-24: qty 1

## 2011-07-24 MED ORDER — NICOTINE 21 MG/24HR TD PT24
21.0000 mg | MEDICATED_PATCH | Freq: Every day | TRANSDERMAL | Status: DC
  Administered 2011-07-25 – 2011-08-05 (×12): 21 mg via TRANSDERMAL
  Filled 2011-07-24 (×14): qty 1

## 2011-07-24 MED ORDER — HYDROCODONE-ACETAMINOPHEN 5-325 MG PO TABS
1.0000 | ORAL_TABLET | Freq: Two times a day (BID) | ORAL | Status: DC | PRN
  Administered 2011-07-24 – 2011-08-06 (×18): 1 via ORAL
  Filled 2011-07-24 (×18): qty 1

## 2011-07-24 MED ORDER — DILTIAZEM HCL ER COATED BEADS 240 MG PO CP24
240.0000 mg | ORAL_CAPSULE | Freq: Every day | ORAL | Status: DC
  Administered 2011-07-25 – 2011-08-06 (×13): 240 mg via ORAL
  Filled 2011-07-24 (×13): qty 1
  Filled 2011-07-24: qty 2

## 2011-07-24 NOTE — ED Notes (Signed)
Report called and pt prepared for discharge from ED and transfer to [facility]. No acute distress.

## 2011-07-24 NOTE — ED Notes (Signed)
Departs unit in stable condition, is safe, calm and cooperative.
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
## 2011-07-24 NOTE — ED Provider Notes (Signed)
5:15 PM The patient was seen in the [facility] consultation by Dr. Berlin Hun, and found to be acutely psychotic and in need of inpatient hospitalization for stabilization. She recommended checking a Depakote level XII hours after the last dose that was given, and recommend starting trilafon 8 mg orally 3 times a day. I will and activities orders.  Felisa Bonier, MD 07/24/11 [phone]
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
## 2011-07-24 NOTE — ED Notes (Signed)
Pt has completed her second tele-psych consult which recommended that the pt be stabilized in a psychiatric facility before returning to an ALF. CSW to reassess and have [facility] review updated information.

## 2011-07-24 NOTE — ED Notes (Signed)
Patient came up to nursing desk to apologize to this Clinical research associate. I assured pt it was fine. I then asked if patient would like a shower today. Patient stated "Not until I get some coffee." Pt is limited to coffee and chose to wait until dinner for coffee. Patient is refusing shower at this time.
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
## 2011-07-24 NOTE — ED Notes (Signed)
Pt has been accepted at [facility] by Dr. Allena Katz to bed 400-1. EDP has been notified and is in agreement with disposition. RN made aware to call report. All support paper work has been completed and faxed to [facility]. No further CSW needs identified at this time.

## 2011-07-24 NOTE — Tx Team (Signed)
Initial Interdisciplinary Treatment Plan  PATIENT STRENGTHS: (choose at least two) Ability for insight Active sense of humor General fund of knowledge Supportive family/friends  PATIENT STRESSORS: Health problems Medication change or noncompliance   PROBLEM LIST: Problem List/Patient Goals Date to be addressed Date deferred Reason deferred Estimated date of resolution  Depression 1/29     Increased risk for SI 1/29     Osteoarthritis, COPD, GERD, chronic back pain 1/29                                          DISCHARGE CRITERIA:  Ability to meet basic life and health needs Improved stabilization in mood, thinking, and/or behavior Need for constant or close observation no longer present Reduction of life-threatening or endangering symptoms to within safe limits Verbal commitment to aftercare and medication compliance  PRELIMINARY DISCHARGE PLAN: Attend aftercare/continuing care group Referrals indicated:  pt states that she is homeless and wants placement in a nursing home. Preference [facility].  PATIENT/FAMIILY INVOLVEMENT: This treatment plan has been presented to and reviewed with the patient, Natalie Thomas.  The patient and family have been given the opportunity to ask questions and make suggestions.  Talitha Givens, RN  07/24/2011, 11:44 PM

## 2011-07-25 DIAGNOSIS — F259 Schizoaffective disorder, unspecified: Principal | ICD-10-CM

## 2011-07-25 DIAGNOSIS — E871 Hypo-osmolality and hyponatremia: Secondary | ICD-10-CM

## 2011-07-25 MED ORDER — DIVALPROEX SODIUM ER 500 MG PO TB24
500.0000 mg | ORAL_TABLET | Freq: Every day | ORAL | Status: DC
  Administered 2011-07-26 – 2011-08-06 (×12): 500 mg via ORAL
  Filled 2011-07-25 (×11): qty 1
  Filled 2011-07-25: qty 8
  Filled 2011-07-25 (×2): qty 1

## 2011-07-25 MED ORDER — DULOXETINE HCL 60 MG PO CPEP
60.0000 mg | ORAL_CAPSULE | Freq: Every day | ORAL | Status: DC
  Administered 2011-07-25 – 2011-08-03 (×10): 60 mg via ORAL
  Filled 2011-07-25 (×11): qty 1

## 2011-07-25 MED ORDER — DIVALPROEX SODIUM ER 500 MG PO TB24
1000.0000 mg | ORAL_TABLET | Freq: Every day | ORAL | Status: DC
  Administered 2011-07-25 – 2011-08-05 (×12): 1000 mg via ORAL
  Filled 2011-07-25 (×14): qty 2

## 2011-07-25 NOTE — Progress Notes (Signed)
Recreation Therapy Notes  07/25/2011         Time: 0930      Group Topic/Focus: The focus of this group is on enhancing the patient's understanding of leisure, barriers to leisure, and the importance of engaging in positive leisure activities upon discharge for improved total health.  Participation Level: Active  Participation Quality: Appropriate and Attentive  Affect: Appropriate  Cognitive: Oriented   Additional Comments: Patient report she came to [facility] because she didn't like the assisted living facility she was living in. She says they couldn't be a real place because they don't have an ad in the phonebook. Patient says she would like placement in a nursing home instead.   Jakota Manthei 07/25/2011 12:04 PM
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
## 2011-07-25 NOTE — Progress Notes (Addendum)
Laying in bed, resting quietly with eyes closed on approach. Opened eyes spontaneously to name. Appears blunted and depressed. Calm and cooperative with assessment. No acute distress noted. States she continues to feel extremely depressed. States she feels like the facility she was at was given her generic versions and she doesn't feel like they were working adequately. Support and encouragement provided. Discussed the fact that sometimes pts are d/c'd with the hope that they will continue to improve as their medication level become therapeutic. Sometimes these medications need to be adjusted because the pt doesn't actually see the improvement we expect. Encouraged to continue to be honest with her feelings and allow herself additional time as her MD works to adjust her medications. States she enjoyed groups and was motivated that she can improve and she shouldn't give up on herself. States her goal for tomorrow is to get a shower and clean herself up. Did endorse pain in her back and PRN provided. Is ambulating with wheelchair without issue. Denies SI/HI/AVH and contracts for safety. POC and medications for the shift reviewed and understanding verbalized. Safety has been maintained with Q15 minute observation. Will continue current POC.
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
## 2011-07-26 LAB — COMPREHENSIVE METABOLIC PANEL
ALT: 9 U/L (ref 0–35)
AST: 10 U/L (ref 0–37)
CO2: 27 mEq/L (ref 19–32)
Calcium: 9.2 mg/dL (ref 8.4–10.5)
Creatinine, Ser: 0.39 mg/dL — ABNORMAL LOW (ref 0.50–1.10)
GFR calc Af Amer: >90 mL/min (ref >90)
GFR calc non Af Amer: >90 mL/min (ref >90)
Sodium: 126 mEq/L — ABNORMAL LOW (ref 135–145)
Total Protein: 6.5 g/dL (ref 6.0–8.3)

## 2011-07-26 NOTE — Evaluation (Signed)
Physical Therapy Evaluation Patient Details Name: Natalie Thomas MRN: [id number] DOB: 1960-12-26 Today's Date: 07/26/2011  Problem List:  Patient Active Problem List  Diagnoses  . NEOPLASM, MALIGNANT, VULVA  . DIABETES MELLITUS, TYPE II  . MENOPAUSE, PREMATURE  . HYPONATREMIA  . SCHIZOAFFECTIVE DISORDER  . DEPRESSION  . HYPERTENSION  . COPD  . GERD  . HIP PAIN, LEFT  . OSTEOPOROSIS  . DYSURIA  . Encounter for long-term (current) use of other medications  . Lumbar disc disease  . Polycythemia secondary to smoking  . Cough  . Schizoaffective disorder, bipolar type    Past Medical History:  Past Medical History  Diagnosis Date  . COPD (chronic obstructive pulmonary disease)   . Depression   . GERD (gastroesophageal reflux disease)   . Hypertension   . Osteoporosis   . Diabetes mellitus, type 2     pt denies but states that she has been treated for DM   Past Surgical History:  Past Surgical History  Procedure Date  . Pelvic fracture surgery   . Eye surgery     on left  eye, pt states that she sees bad out of the right eye and needs surgery there    PT Assessment/Plan/Recommendation PT Assessment Clinical Impression Statement: Patient admitted for stabilization after eviction from group home presents wtih decreased balance with h/o falls (reports 9 in the past year,) and high fall risk per Sharlene Motts balance assessment (scores <45 demonstrate fall risk, pt scored 36/56.)  Feel she will benefit from skilled PT in current setting and from [facility] for skilled PT for balance training, core strengthening and pain management.  Would also benefit from orthopedic consult due to h/o falls with osteoporosis and back pain significantly limiting function.   PT Recommendation/Assessment: Patient will need skilled PT in the acute care venue PT Problem List: Decreased activity tolerance;Decreased strength;Decreased mobility;Pain PT Therapy Diagnosis : Difficulty walking;Generalized  weakness;Abnormality of gait PT Plan PT Frequency: Min 2X/week PT Treatment/Interventions: DME instruction;Gait training;Balance training;Therapeutic exercise;Patient/family education PT Recommendation Follow Up Recommendations: Skilled nursing facility Equipment Recommended: Defer to next venue PT Goals  Acute Rehab PT Goals PT Goal Formulation: With patient Time For Goal Achievement: 2 weeks Pt will Transfer Bed to Chair/Chair to Bed: with modified independence PT Transfer Goal: Bed to Chair/Chair to Bed - Progress: Goal set today Pt will Stand: with modified independence;3 - 5 min;with unilateral upper extremity support (to improve tolerance to ADL/IADL's) PT Goal: Stand - Progress: Progressing toward goal Pt will Ambulate: >150 feet;with modified independence;with rolling walker PT Goal: Ambulate - Progress: Goal set today Pt will Perform Home Exercise Program: with supervision, verbal cues required/provided PT Goal: Perform Home Exercise Program - Progress: Goal set today  PT Evaluation Precautions/Restrictions  Precautions Precautions: Fall Precaution Comments: Patient reports 9 falls in 1 year.  Using walker at group home, but would trip on towel or slip in tub.  Able to get up without assist except once Prior Functioning  Home Living Type of Home: Other (Comment) (Group home, but recently evicted) Home Layout: One level Home Access: Stairs to enter Entrance Stairs-Rails: Right Entrance Stairs-Number of Steps: 4 Bathroom Shower/Tub: Tub/shower unit Home Adaptive Equipment: Walker - rolling Additional Comments: hasn't recovered her personal belongings yet from group home.  Hopes to get them through social services Prior Function Level of Independence: Requires assistive device for independence;Independent with gait;Independent with basic ADLs;Independent with transfers Comments: Unable to stand or walk for long due to back pain Cognition Cognition Arousal/Alertness:  Awake/alert Overall Cognitive Status: Appears within functional limits for tasks assessed Sensation/Coordination Sensation Light Touch: Impaired by gross assessment Additional Comments: reports feels "weird" on anterior lower legs unable to give specific term to altered sensation. Extremity Assessment RLE Assessment RLE Assessment: Within Functional Limits (but tight hamstrings and functional quad weakness) LLE Assessment LLE Assessment: Within Functional Limits (but tight hamstrings and functional quad weakness) Mobility (including Balance) Bed Mobility Bed Mobility: Yes Supine to Sit: 6: Modified independent (Device/Increase time) Sit to Supine: 6: Modified independent (Device/Increase time) Transfers Transfers: Yes Sit to Stand: 6: Modified independent (Device/Increase time);With upper extremity assist Stand to Sit: With upper extremity assist;6: Modified independent (Device/Increase time) Stand Pivot Transfers: With armrests;5: Supervision Stand Pivot Transfer Details (indicate cue type and reason): for safety due to LE pain/weakness Ambulation/Gait Ambulation/Gait: Yes Ambulation/Gait Assistance: 5: Supervision Ambulation/Gait Assistance Details (indicate cue type and reason): for safety due to increased pain with walking.  Cues for increased upright posture and to decrease proximity to walker (pt. c/o increased pain with upright posture) Ambulation Distance (Feet): 90 Feet Assistive device: Rolling walker Gait Pattern: Trunk flexed;Decreased stride length Wheelchair Mobility Wheelchair Mobility: No (patient reports using W/C with feet propelling)  Posture/Postural Control Posture/Postural Control: Postural limitations Postural Limitations: Patient kyphotic with shoulder elevation, forward head and flattened lumbar spine Balance Balance Assessed: Yes Berg Balance Test Sit to Stand: Able to stand  independently using hands Standing Unsupported: Able to stand 2 minutes with  supervision Sitting with Back Unsupported but Feet Supported on Floor or Stool: Able to sit safely and securely 2 minutes Stand to Sit: Controls descent by using hands Transfers: Able to transfer with verbal cueing and /or supervision Standing Unsupported with Eyes Closed: Able to stand 10 seconds with supervision Standing Ubsupported with Feet Together: Able to place feet together independently and stand for 1 minute with supervision From Standing, Reach Forward with Outstretched Arm: Can reach forward >12 cm safely (5") From Standing Position, Pick up Object from Floor: Able to pick up shoe, needs supervision From Standing Position, Turn to Look Behind Over each Shoulder: Looks behind one side only/other side shows less weight shift Turn 360 Degrees: Able to turn 360 degrees safely but slowly Standing Unsupported, Alternately Place Feet on Step/Stool: Able to complete >2 steps/needs minimal assist Standing Unsupported, One Foot in Front: Able to take small step independently and hold 30 seconds Standing on One Leg: Tries to lift leg/unable to hold 3 seconds but remains standing independently Total Score: 36  Exercise    End of Session PT - End of Session Activity Tolerance: Patient limited by pain Patient left: in bed;with call bell in reach General Behavior During Session: [facility] for tasks performed Cognition: [facility] for tasks performed  [facility] 07/26/2011, 4:32 PM

## 2011-07-26 NOTE — Discharge Planning (Signed)
Met with patient in Aftercare Planning Group.   Patient much more relaxed and happy in group today.  States now that she liked the group home owner, but did not like the staff where she went.  Case Manager talked to group about burning bridges and then having to go further afield to look for placements, and patient replied that she would be okay with going to [facility] or [facility] as needed due to not having placement possibilities in [place].  Case Manager is awaiting news from PT consult to determine where placement can be requested.  Ambrose Mantle, LCSW 07/26/2011, 12:10 PM

## 2011-07-26 NOTE — Progress Notes (Signed)
[facility] MD Progress Note  07/26/2011 2:13 PM  Diagnosis:  Axis I: Schizoaffective Disorder - Bipolar Type.   The patient was seen today and reports the following:   ADL's: Intact.  Sleep: The patient reports to sleeping well last night.  Appetite: The patient continues to report a good appetite.   Mild>(1-10) >Severe  Hopelessness (1-10): 3  Depression (1-10): 5  Anxiety (1-10): 2  Pain (1-10): 0  Suicidal Ideation: The patient denies any suicidal ideations today.  Plan: No  Intent: No  Means: No   Homicidal Ideation: The patient adamantly denies any homicidal ideations today.  Plan: No  Intent: No.  Means: No   General Appearance /Behavior: Casual and cooperative.  Eye Contact: Good.  Speech: Appropriate in rate and volume.  Motor Behavior: Appropriate.  Level of Consciousness: Alert and Oriented x 3.  Mental Status: Alert and Oriented x 3.  Mood: Moderately Depressed.  Affect: Moderately Constricted.  Anxiety Level: Mild Anxiety Noted.  Thought Process: WNL.  Thought Content: The patient denies any current auditory or visual hallucinations or delusional thinking today.  Perception:. wnl.  Judgment: Fair to Good.  Insight: Fair to Good.  Cognition: Orientated to time, place and person.  Sleep:  Number of Hours: 6.25    Vital Signs:Blood pressure 156/90, pulse 93, temperature 98.1 F (36.7 C), temperature source Oral, resp. rate 18, height 5\' 2"  (1.575 m), weight 88.905 kg (196 lb).  Current Medications: Current Facility-Administered Medications  Medication Dose Route Frequency Provider Last Rate Last Dose  . acetaminophen (TYLENOL) tablet 650 mg  650 mg Oral Q6H PRN Franchot Gallo, MD      . albuterol-ipratropium (COMBIVENT) inhaler 2 puff  2 puff Inhalation QID PRN Franchot Gallo, MD      . aspirin chewable tablet 81 mg  81 mg Oral Daily Franchot Gallo, MD   81 mg at 07/26/11 0759  . calcium-vitamin D (OSCAL WITH D) 500-200 MG-UNIT per tablet 1 tablet  1 tablet  Oral [id number] Franchot Gallo, MD   1 tablet at 07/26/11 0800  . cholecalciferol (VITAMIN D) tablet 1,000 Units  1,000 Units Oral Daily Franchot Gallo, MD   1,000 Units at 07/26/11 0759  . clonazePAM (KLONOPIN) tablet 0.5 mg  0.5 mg Oral BH-qamhs Prabhav Faulkenberry, MD   0.5 mg at 07/26/11 0759  . diltiazem (CARDIZEM CD) 24 hr capsule 240 mg  240 mg Oral Daily Franchot Gallo, MD   240 mg at 07/26/11 0759  . divalproex (DEPAKOTE ER) 24 hr tablet 1,000 mg  1,000 mg Oral QHS Franchot Gallo, MD   1,000 mg at 07/25/11 2149  . divalproex (DEPAKOTE ER) 24 hr tablet 500 mg  500 mg Oral Q breakfast Franchot Gallo, MD   500 mg at 07/26/11 0800  . docusate sodium (COLACE) capsule 100 mg  100 mg Oral BH-qamhs Avyukth Bontempo, MD   100 mg at 07/26/11 0800  . DULoxetine (CYMBALTA) DR capsule 60 mg  60 mg Oral Daily Franchot Gallo, MD   60 mg at 07/26/11 0800  . DULoxetine (CYMBALTA) DR capsule 60 mg  60 mg Oral Q1400 Franchot Gallo, MD   60 mg at 07/25/11 1310  . Fluticasone-Salmeterol (ADVAIR) 250-50 MCG/DOSE inhaler 1 puff  1 puff Inhalation Q12H Franchot Gallo, MD   1 puff at 07/26/11 0804  . HYDROcodone-acetaminophen (NORCO) 5-325 MG per tablet 1 tablet  1 tablet Oral BID PRN Franchot Gallo, MD   1 tablet at 07/26/11 0802  . magnesium hydroxide (MILK OF MAGNESIA) suspension  30 mL  30 mL Oral Daily PRN Franchot Gallo, MD      . nicotine (NICODERM CQ - dosed in mg/24 hours) patch 21 mg  21 mg Transdermal Daily Franchot Gallo, MD   21 mg at 07/26/11 0803  . pantoprazole (PROTONIX) EC tablet 40 mg  40 mg Oral QHS Franchot Gallo, MD   40 mg at 07/25/11 2200  . perphenazine (TRILAFON) tablet 8 mg  8 mg Oral BH-qamhs Keyuana Wank, MD   8 mg at 07/26/11 0801  . potassium chloride (K-DUR,KLOR-CON) CR tablet 10 mEq  10 mEq Oral BID Franchot Gallo, MD   10 mEq at 07/25/11 1704  . senna-docusate (Senokot-S) tablet 1 tablet  1 tablet Oral Daily Franchot Gallo, MD   1 tablet at 07/26/11 0801  . simvastatin (ZOCOR) tablet 20  mg  20 mg Oral QHS Franchot Gallo, MD   20 mg at 07/25/11 2148   Lab Results:  Results for orders placed during the hospital encounter of 07/20/11 (from the past 48 hour(s))  VALPROIC ACID LEVEL     Status: Normal   Collection Time   07/24/11  7:47 PM      Component Value Range Comment   Valproic Acid Lvl 54.4  50.0 - 100.0 (ug/mL)    Time was spent today discussing with the patient her current status.  Overall, the patient states that she is "doing better" and is very pleased with her pain control.  She is eager to locate a new placement "which is safe."  Treatment Plan Summary:  1. Daily contact with patient to assess and evaluate symptoms and progress in treatment  2. Medication management  3. The patient will deny suicidal ideations or homicidal ideations for 48 hours prior to discharge and have a depression and anxiety rating of 3 or less. The patient will also deny any auditory or visual hallucinations or delusional thinking.   Plan:  1. Will continue current medications.  2. Will continue to monitor. 3. Will order a CMP and Serum Depakote Level tonight. 4. Case Manager working on placement issue.   Amir Glaus 07/26/2011, 2:13 PM

## 2011-07-26 NOTE — Progress Notes (Addendum)
Patient in her bed during this assessment. She reports having a good day, although her mood and affect flat and depressed. She denies SI/Hi and denies hallucinations. Q 15 minute checks continues to maintain safety. 0700: Pt's B/P slightly elevated this morning. Writer assessed patient she denied having any symptom. "I take high blood pressure medicines". Writer reviewed patient"s order no B/P medications noted.

## 2011-07-26 NOTE — Progress Notes (Signed)
Pt has been up throughout the day today, has been participating in various milieu activities, has denies any suicidal thoughts, has stated that she is feeling better today, pt did speak some about where she might could go when she is discharged from this facility, pt has received all medications today without incident, support provided, will continue to monitor

## 2011-07-27 DIAGNOSIS — E871 Hypo-osmolality and hyponatremia: Secondary | ICD-10-CM | POA: Diagnosis not present

## 2011-07-27 LAB — BASIC METABOLIC PANEL
Chloride: 87 mEq/L — ABNORMAL LOW (ref 96–112)
GFR calc Af Amer: >90 mL/min (ref >90)
GFR calc non Af Amer: >90 mL/min (ref >90)
Glucose, Bld: 83 mg/dL (ref 70–99)
Potassium: 4.6 mEq/L (ref 3.5–5.1)
Sodium: 121 mEq/L — ABNORMAL LOW (ref 135–145)

## 2011-07-27 MED ORDER — TUBERCULIN PPD 5 UNIT/0.1ML ID SOLN
5.0000 [IU] | Freq: Once | INTRADERMAL | Status: AC
  Administered 2011-07-27: 0.5 [IU] via INTRADERMAL

## 2011-07-27 MED ORDER — SODIUM CHLORIDE 1 G PO TABS
1.0000 g | ORAL_TABLET | ORAL | Status: DC
  Administered 2011-07-27: 1 g via ORAL
  Filled 2011-07-27 (×3): qty 1

## 2011-07-27 MED ORDER — GATORADE (BH)
240.0000 mL | ORAL | Status: DC
  Administered 2011-07-27 – 2011-08-01 (×13): 240 mL via ORAL
  Filled 2011-07-27: qty 480
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
## 2011-07-27 NOTE — Progress Notes (Signed)
Pt is out in mileu interacting with peers and attending groups.  Denies SI/HI and no A/V hallucinations.  Rates depression at 1 and hopelessness at 3.  PPD administered and UA cup with instruction given.  Using w/c for ambulation.  Ongoing support and encouragement and 15' checks cont for safety.

## 2011-07-27 NOTE — Progress Notes (Signed)
[facility] Group Notes:  (Counselor/Nursing/MHT/Case Management/Adjunct)  07/27/2011 10:21 AM  Type of Therapy:  Group therapy  Participation Level:  Active  Participation Quality:  Monopolizing  Affect:  Appropriate  Cognitive:  Oriented  Insight:  Limited  Engagement in Group:  Good  Engagement in Therapy:  Good  Modes of Intervention:  Education, Problem-solving and Support  Summary of Progress/Problems: Patient stated that she is doing well today, feeling better, not suicidal and happy. She stated that PT came to see her and she does qualify for skilled nursing. She stated that they also said that she could have broken bones causing her back pain, and that doctor said she could have MRI, etc. Before leaving the hospital.   Veto Kemps 07/27/2011, 10:21 AM

## 2011-07-27 NOTE — Progress Notes (Addendum)
Recreation Therapy Notes  07/27/2011         Time: 1100      Group Topic/Focus: The focus of the group is on enhancing the patients' ability to cope with stressors by understanding what coping is, why it is important, the negative effects of stress and developing healthier coping skills. Patients practice [place] and discuss how exercise can be used as a healthy coping strategy.  Participation Level: Active  Participation Quality: Appropriate and Attentive  Affect: Appropriate  Cognitive: Oriented   Additional Comments: Patient denies SI/HI and says she is just waiting for placement.   Keefer Soulliere 07/27/2011 4:00 PM

## 2011-07-27 NOTE — Discharge Planning (Addendum)
During Aftercare Planning Group, Case Manager provided psychoeducation on "Suicide Prevention Information."  This included descriptions of risk factors for suicide, warning signs that an individual is in crisis and thinking of suicide, and what to do if this occurs.  Pt indicated understanding of information provided, and will read brochure given upon discharge.     Based on recommendation by Physical Therapist, submitted PASRR request for skilled nursing facility to state.  Faxed PT note to state to support PASRR request.  Per State Regulation 482.30  This chart was reviewed for medical necessity with respect to the patient's Admission/Duration of stay.   Next review due:  07/30/11   Natalie Mantle, LCSW  07/27/2011  11:53 AM

## 2011-07-27 NOTE — Progress Notes (Signed)
[facility] Group Notes:  (Counselor/Nursing/MHT/Case Management/Adjunct)  07/27/2011 8:14 AM  Type of Therapy:  Music Therapy  Participation Level:  Active  Participation Quality:  Appropriate  Affect:  Appropriate  Cognitive:  Oriented  Insight:  Limited  Engagement in Group:  Good  Engagement in Therapy:  Good  Modes of Intervention:  Activity, Support and music, relaxation techniques  Summary of Progress/Problems: Patient was active in music listening. She found music to be wonderful, relaxing and peaceful. Receptive to relaxation techniques    Luisalberto Beegle, Aram Beecham 07/27/2011, 8:14 AM

## 2011-07-27 NOTE — Progress Notes (Signed)
[facility] MD Progress Note  07/27/2011 11:20 AM  Diagnosis:  Axis I: Schizoaffective Disorder - Bipolar Type.   The patient was seen today and reports the following:   ADL's: Intact.  Sleep: The patient reports to continuing to sleep well last night.  Appetite: The patient continues to report a good appetite.   Mild>(1-10) >Severe  Hopelessness (1-10): 0  Depression (1-10): 1  Anxiety (1-10): 1  Pain (1-10): 2   Suicidal Ideation: The patient adamantly denies any suicidal ideations today.  Plan: No  Intent: No  Means: No   Homicidal Ideation: The patient adamantly denies any homicidal ideations today.  Plan: No  Intent: No.  Means: No   General Appearance /Behavior: Casual and cooperative.  Eye Contact: Good.  Speech: Appropriate in rate and volume.  Motor Behavior: Appropriate.  Level of Consciousness: Alert and Oriented x 3.  Mental Status: Alert and Oriented x 3.  Mood: Essentially Euthymic.  Affect: Mildly Constricted.  Anxiety Level: No anxiety.  Thought Process: WNL.  Thought Content: The patient denies any current auditory or visual hallucinations or delusional thinking today.  Perception:. wnl.  Judgment: Fair to Good.  Insight: Fair to Good.  Cognition: Orientated to time, place and person.  Sleep:  Number of Hours: 6.5    Vital Signs:Blood pressure 149/82, pulse 87, temperature 97.7 F (36.5 C), temperature source Oral, resp. rate 18, height 5\' 2"  (1.575 m), weight 88.905 kg (196 lb). Current Medications: Current Facility-Administered Medications  Medication Dose Route Frequency Provider Last Rate Last Dose  . acetaminophen (TYLENOL) tablet 650 mg  650 mg Oral Q6H PRN Franchot Gallo, MD      . albuterol-ipratropium (COMBIVENT) inhaler 2 puff  2 puff Inhalation QID PRN Franchot Gallo, MD      . aspirin chewable tablet 81 mg  81 mg Oral Daily Franchot Gallo, MD   81 mg at 07/27/11 0810  . calcium-vitamin D (OSCAL WITH D) 500-200 MG-UNIT per tablet 1 tablet  1  tablet Oral BH-[id number] Franchot Gallo, MD   1 tablet at 07/27/11 [phone]  . cholecalciferol (VITAMIN D) tablet 1,000 Units  1,000 Units Oral Daily Franchot Gallo, MD   1,000 Units at 07/26/11 0759  . clonazePAM (KLONOPIN) tablet 0.5 mg  0.5 mg Oral BH-qamhs Jaclene Bartelt, MD   0.5 mg at 07/27/11 0813  . diltiazem (CARDIZEM CD) 24 hr capsule 240 mg  240 mg Oral Daily Franchot Gallo, MD   240 mg at 07/27/11 0813  . divalproex (DEPAKOTE ER) 24 hr tablet 1,000 mg  1,000 mg Oral QHS Franchot Gallo, MD   1,000 mg at 07/26/11 2159  . divalproex (DEPAKOTE ER) 24 hr tablet 500 mg  500 mg Oral Q breakfast Franchot Gallo, MD   500 mg at 07/27/11 0813  . docusate sodium (COLACE) capsule 100 mg  100 mg Oral BH-qamhs Cameryn Chrisley, MD   100 mg at 07/27/11 0815  . DULoxetine (CYMBALTA) DR capsule 60 mg  60 mg Oral Daily Franchot Gallo, MD   60 mg at 07/27/11 0813  . DULoxetine (CYMBALTA) DR capsule 60 mg  60 mg Oral Q1400 Franchot Gallo, MD   60 mg at 07/26/11 1415  . Fluticasone-Salmeterol (ADVAIR) 250-50 MCG/DOSE inhaler 1 puff  1 puff Inhalation Q12H Franchot Gallo, MD   1 puff at 07/27/11 0813  . HYDROcodone-acetaminophen (NORCO) 5-325 MG per tablet 1 tablet  1 tablet Oral BID PRN Franchot Gallo, MD   1 tablet at 07/26/11 0802  . magnesium hydroxide (MILK OF  MAGNESIA) suspension 30 mL  30 mL Oral Daily PRN Franchot Gallo, MD      . nicotine (NICODERM CQ - dosed in mg/24 hours) patch 21 mg  21 mg Transdermal Daily Franchot Gallo, MD   21 mg at 07/27/11 2952  . pantoprazole (PROTONIX) EC tablet 40 mg  40 mg Oral QHS Franchot Gallo, MD   40 mg at 07/26/11 2200  . perphenazine (TRILAFON) tablet 8 mg  8 mg Oral BH-qamhs Azyiah Bo, MD   8 mg at 07/27/11 0813  . senna-docusate (Senokot-S) tablet 1 tablet  1 tablet Oral Daily Franchot Gallo, MD   1 tablet at 07/27/11 0813  . simvastatin (ZOCOR) tablet 20 mg  20 mg Oral QHS Franchot Gallo, MD   20 mg at 07/26/11 2200  . tuberculin injection 5 Units  5 Units  Intradermal Once Viviann Spare, NP       Lab Results:  Results for orders placed during the hospital encounter of 07/24/11 (from the past 48 hour(s))  COMPREHENSIVE METABOLIC PANEL     Status: Abnormal   Collection Time   07/26/11  8:29 PM      Component Value Range Comment   Sodium 126 (*) 135 - 145 (mEq/L)    Potassium 4.1  3.5 - 5.1 (mEq/L)    Chloride 91 (*) 96 - 112 (mEq/L)    CO2 27  19 - 32 (mEq/L)    Glucose, Bld 103 (*) 70 - 99 (mg/dL)    BUN 6  6 - 23 (mg/dL)    Creatinine, Ser 8.41 (*) 0.50 - 1.10 (mg/dL)    Calcium 9.2  8.4 - 10.5 (mg/dL)    Total Protein 6.5  6.0 - 8.3 (g/dL)    Albumin 3.0 (*) 3.5 - 5.2 (g/dL)    AST 10  0 - 37 (U/L)    ALT 9  0 - 35 (U/L)    Alkaline Phosphatase 59  39 - 117 (U/L)    Total Bilirubin 0.2 (*) 0.3 - 1.2 (mg/dL)    GFR calc non Af Amer >90  >90 (mL/min)    GFR calc Af Amer >90  >90 (mL/min)   VALPROIC ACID LEVEL     Status: Normal   Collection Time   07/26/11  8:29 PM      Component Value Range Comment   Valproic Acid Lvl 50.4  50.0 - 100.0 (ug/mL)    Time was spent discussing with the patient her desire for a medical workup for her back pain.  The patient also states that she at some point would like PT to work on improving her strength.  [facility] is in the process of evaluating her physical needs.  Treatment Plan Summary:  1. Daily contact with patient to assess and evaluate symptoms and progress in treatment  2. Medication management  3. The patient will deny suicidal ideations or homicidal ideations for 48 hours prior to discharge and have a depression and anxiety rating of 3 or less. The patient will also deny any auditory or visual hallucinations or delusional thinking.   Plan:  1. Will continue current medications.  2. Will continue to monitor.  3. Will start Sodium Chloride 1 gram q am, 2 pm and hs as well as Gatorade 240 ccs q am, 2 pm and hs for hyponatremia. 4. Case Manager working on placement issue.   Pixie Burgener 07/27/2011, 11:20 AM

## 2011-07-27 NOTE — Progress Notes (Addendum)
[facility] Group Notes:  (Counselor/Nursing/MHT/Case Management/Adjunct)  07/27/2011 8:08 AM  Type of Therapy:  Group therapy 07/26/11  Participation Level:  Active  Participation Quality:  Attentive and Sharing  Affect:  Blunted  Cognitive:  Oriented  Insight:  Limited  Engagement in Group:  Good  Engagement in Therapy:  Good  Modes of Intervention:  Support and Exploration  Summary of Progress/Problems: Talked about being more hopeful. Stated that she is feeling well. Supportive of her peers as they talked about future goals.   Natalie Thomas, Aram Beecham 07/27/2011, 8:08 AM

## 2011-07-28 LAB — BASIC METABOLIC PANEL
BUN: 6 mg/dL (ref 6–23)
Creatinine, Ser: 0.37 mg/dL — ABNORMAL LOW (ref 0.50–1.10)
GFR calc Af Amer: >90 mL/min (ref >90)
GFR calc non Af Amer: >90 mL/min (ref >90)

## 2011-07-28 LAB — OSMOLALITY: Osmolality: 251 mOsm/kg — ABNORMAL LOW (ref 275–300)

## 2011-07-28 LAB — CORTISOL-AM, BLOOD: Cortisol - AM: 5.3 ug/dL (ref 4.3–22.4)

## 2011-07-28 LAB — OSMOLALITY, URINE: Osmolality, Ur: 72 mOsm/kg — ABNORMAL LOW (ref 390–1090)

## 2011-07-28 NOTE — Progress Notes (Signed)
Patient is awake alert, oriented to person place situation and date. She states that she is feeling well and has a mood 2/10 worst. She has a smiling affect and says that she is sleeping well and her appetite is good. She denies any racing thoughts.

## 2011-07-28 NOTE — Progress Notes (Signed)
Subjective: Asymptomatic.  Objective: Vital signs in last 24 hours: Temp:  [98.9 F (37.2 C)] 98.9 F (37.2 C) (02/02 0808) Pulse Rate:  [69-88] 88  (02/02 0810) Resp:  [16-20] 16  (02/02 0808) BP: (106-183)/(71-79) 106/71 mmHg (02/02 0810) Weight change:  Last BM Date: 07/27/11  Intake/Output from previous day:       Physical Exam: General: Patient does not appear to be in obvious acute distress. Alert, communicative, fully oriented, talking in complete sentences, not short of breath at rest.  HEENT: No clinical pallor, no jaundice, no conjunctival injection or discharge. Hydration status appears fair.  NECK: Supple, JVP not seen, no carotid bruits, no palpable lymphadenopathy, no palpable goiter.  CHEST: Clinically clear to auscultation, no wheezes, no crackles.  HEART: Sounds 1 and 2 heard, normal, regular, no murmurs.  ABDOMEN: Obese, soft, non-tender, no palpable organomegaly, no palpable masses, normal bowel sounds.  GENITALIA: Not examined.  LOWER EXTREMITIES: No pitting edema, palpable peripheral pulses.  MUSCULOSKELETAL SYSTEM: Generalized osteoarthritic changes, otherwise, normal.  CENTRAL NERVOUS SYSTEM: No focal neurologic deficit on gross examination.  Lab Results: No results found for this basename: WBC:2,HGB:2,HCT:2,PLT:2 in the last 72 hours  Basename 07/28/11 0651 07/27/11 2033  NA 127* 121*  K 4.5 4.6  CL 92* 87*  CO2 26 25  GLUCOSE 99 83  BUN 6 6  CREATININE 0.37* 0.63  CALCIUM 9.1 9.1   No results found for this or any previous visit (from the past 240 hour(s)).   Studies/Results: No results found.  Medications: Scheduled Meds:   . aspirin  81 mg Oral Daily  . calcium-vitamin D  1 tablet Oral BH-[id number]  . cholecalciferol  1,000 Units Oral Daily  . clonazePAM  0.5 mg Oral BH-qamhs  . diltiazem  240 mg Oral Daily  . divalproex  1,000 mg Oral QHS  . divalproex  500 mg Oral Q breakfast  . docusate sodium  100 mg Oral BH-qamhs  . DULoxetine   60 mg Oral Daily  . DULoxetine  60 mg Oral Q1400  . Fluticasone-Salmeterol  1 puff Inhalation Q12H  . gatorade (BH)  240 mL Oral BH-[id number]  . nicotine  21 mg Transdermal Daily  . pantoprazole  40 mg Oral QHS  . perphenazine  8 mg Oral BH-qamhs  . senna-docusate  1 tablet Oral Daily  . simvastatin  20 mg Oral QHS  . tuberculin  5 Units Intradermal Once  . DISCONTD: sodium chloride  1 g Oral BH-[id number]   Continuous Infusions:  PRN Meds:.acetaminophen, albuterol-ipratropium, HYDROcodone-acetaminophen, magnesium hydroxide  Assessment/Plan: Principal Problem:  *Schizoaffective disorder, bipolar type. Managing, per primary.  Active Problems:  1. Hyponatremia: Patient was documented to have a precipitous drop in serum sodium, from 137 on 07/26/11, to 126 on 05/25/12. Repeat testing showed a sodium level of 121 on the same date. Review of patient's current medications have revealed no obvious culprits. 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 TSH and Cortisol levels are normal, while serum osmolality is low at 251, and urine osmolality is exceedingly low, at 72. There is already an improvement in sodium today, and more is anticipated. I recommend restrict free water further, to 1000 cc/day.  2. COPD: This appears stable. Patient smokes about one and half pack cigarettes per day. She is already on Nicoderm CQ patch.  3. HTN: Controlled today. Observe for now, but if necessary, Cardizem CD can be increased to 300 mg daily.  4. DM 2: This appears diet-controlled.  5. GERD: Asymptomatic on PPI.  Comment: Continue to follow sodium levels. Medical team will sign of today, but feel free to call with questions.   LOS: 4 days   Natalie Thomas,CHRISTOPHER 07/28/2011, 2:50 PM

## 2011-07-28 NOTE — Progress Notes (Signed)
Pt has been pleasant and cooperative  She is smiling laughing and joking around with others and is up walking around and not using her wheelchair   She does not have a wheel chair at home but insists on using one here the first few days she is here   She attends groups and participates   She denies suicidal and homicidal ideation and her energy level is normal  Verbal support given medications administered and effectiveness monitored  Q 15 min checks   Pt safe at present

## 2011-07-28 NOTE — Progress Notes (Signed)
Patient ID: Natalie Thomas, female   DOB: 01-15-1961, 51 y.o.   MRN: [id number] The patient is pleasant and interacting appropriately in the milieu. Denies any suicidal ideation. Denies any A/V hallucinations. Her only complaints are somatic in nature. Requested pain medication for back pain. Uses a wheel chair to move about the unit. When asked if she uses a wheel chair at home stated that she did not, but would like to get one.

## 2011-07-28 NOTE — Progress Notes (Signed)
Patient ID: Natalie Thomas, female   DOB: 19-May-1961, 51 y.o.   MRN: [id number]   [facility] Group Notes:  (Counselor/Nursing/MHT/Case Management/Adjunct)  07/28/2011 11 AM  Type of Therapy:  Group Therapy, Dance/Movement Therapy   Participation Level:  Active  Participation Quality:  Appropriate  Affect:  Appropriate  Cognitive:  Appropriate  Insight:  Limited  Engagement in Group:  Good  Engagement in Therapy:  Good  Modes of Intervention:  Clarification, Problem-solving, Role-play, Socialization and Support  Summary of Progress/Problems:  Group enacted activities they enjoy to release physical tension as well as to embody tasks to use when feeling stuck. Pt was actively engaged in the group process, but was unable to lift arms with full mobility and strength. This suggests that she is ready to follow through with a recovery plan, but needs more confidence to do it.   [name], [employer]

## 2011-07-29 LAB — BASIC METABOLIC PANEL
Calcium: 9.3 mg/dL (ref 8.4–10.5)
Creatinine, Ser: 0.39 mg/dL — ABNORMAL LOW (ref 0.50–1.10)
GFR calc Af Amer: >90 mL/min (ref >90)
GFR calc non Af Amer: >90 mL/min (ref >90)
Sodium: 128 mEq/L — ABNORMAL LOW (ref 135–145)

## 2011-07-29 NOTE — Progress Notes (Signed)
Patient ID: Natalie Thomas, female   DOB: 08-06-1960, 51 y.o.   MRN: [id number]   [facility] Group Notes:  (Counselor/Nursing/MHT/Case Management/Adjunct)  07/29/2011 11 AM  Type of Therapy:  Group Therapy, Dance/Movement Therapy   Participation Level:  Active  Participation Quality:  Appropriate  Affect:  Appropriate  Cognitive:  Appropriate  Insight:  Good  Engagement in Group:  Good  Engagement in Therapy:  Good  Modes of Intervention:  Clarification, Problem-solving, Role-play, Socialization and Support  Summary of Progress/Problems: Group focused on what makes a external support healthy or unhealthy. The group then practiced how to show others the ways in which they need to be supported, as well as how to give themselves support. Pt reported that she was feeling awake. It was easy for the pt to give and receive support from her peers. This indicates that she is strongly supported going into recovery.  [name], [employer]

## 2011-07-29 NOTE — Progress Notes (Signed)
Pt is pleasant today and said she slept well last night   She is interactive and has attended group and participated appropriately   She verbalizes insight and positive coping skills referring to things she could do to improve the quality of her life   Pt is on 1000 cc fluid restriction and is having difficulty complying with same  Although she has been given information regarding psychogenic polydipisia and the causes and effects on your body    Verbal support given  Medications administered and effectiveness monitored   Q 15 min checks   Pt safe at present

## 2011-07-29 NOTE — Progress Notes (Signed)
Patient ID: Natalie Thomas, female   DOB: December 26, 1960, 51 y.o.   MRN: [id number] The patient was bright and cheerful this evening. Playing cards in the milieu. No complaints expressed. Attended evening group. Stated she was looking for a new place to live. Compliant with meds.

## 2011-07-29 NOTE — Progress Notes (Signed)
Natalie Thomas is feeling very well this am.  She reports a 0/10 worst mood.  She is moderately neat sitting in a wheelchair. She complains of back and hip pain.  She says she has no suicidal/homicidal thoughts.  She denies AH/VH.  She sleeps well and is eating well.  She is wanting to go to a rehab site or an assisted living home.

## 2011-07-29 NOTE — Progress Notes (Signed)
PPD is negative

## 2011-07-30 LAB — COMPREHENSIVE METABOLIC PANEL
ALT: 11 U/L (ref 0–35)
AST: 11 U/L (ref 0–37)
Albumin: 3.2 g/dL — ABNORMAL LOW (ref 3.5–5.2)
Calcium: 9 mg/dL (ref 8.4–10.5)
GFR calc Af Amer: >90 mL/min (ref >90)
Sodium: 126 mEq/L — ABNORMAL LOW (ref 135–145)
Total Protein: 6.7 g/dL (ref 6.0–8.3)

## 2011-07-30 LAB — GLUCOSE, CAPILLARY: Glucose-Capillary: 128 mg/dL — ABNORMAL HIGH (ref 70–99)

## 2011-07-30 MED ORDER — SODIUM CHLORIDE 1 G PO TABS
1.0000 g | ORAL_TABLET | Freq: Three times a day (TID) | ORAL | Status: DC
  Administered 2011-07-30 – 2011-08-01 (×6): 1 g via ORAL
  Filled 2011-07-30 (×8): qty 1

## 2011-07-30 NOTE — Discharge Planning (Addendum)
Met with patient in Aftercare Planning Group.   Continued work on [employer], sending required paperwork to [employer] who will review and send employee to interview patient.  FL-2 done and included in packet along with H&P and doctor notes.  Patient reports feeling much better, but having continued balance problems.  Per State Regulation 482.30  This chart was reviewed for medical necessity with respect to the patient's Admission/Duration of stay.   Next review due:  08/02/11   Ambrose Mantle, LCSW  07/30/2011  12:49 PM    Spoke to Ron at [employer] [facility], who had called with concern that they have been unable to find patient.  At patient's request, told him that she is back in hospital and misses them.  He stated that they have always been able to redirect patient and have not experienced any of the violent behaviors reported by group home when she came in.  They are concerned, and this was shared with patient at Ron's request.  Ambrose Mantle, LCSW 07/30/2011, 1:46 PM

## 2011-07-30 NOTE — Progress Notes (Signed)
[facility] MD Progress Note  07/30/2011 12:50 PM  Diagnosis:  Axis I: Schizoaffective Disorder - Bipolar Type.   The patient was seen today and reports the following:   ADL's: Intact.  Sleep: The patient reports to continuing to sleep well last night.  Appetite: The patient continues to report a good appetite.   Mild>(1-10) >Severe  Hopelessness (1-10): 0  Depression (1-10): 0  Anxiety (1-10): 0  Pain (1-10): 2   Suicidal Ideation: The patient adamantly denies any suicidal ideations today.  Plan: No  Intent: No  Means: No   Homicidal Ideation: The patient adamantly denies any homicidal ideations today.  Plan: No  Intent: No.  Means: No   General Appearance /Behavior: Casual and cooperative.  Eye Contact: Good.  Speech: Appropriate in rate and volume.  Motor Behavior: Appropriate.  Level of Consciousness: Alert and Oriented x 3.  Mental Status: Alert and Oriented x 3.  Mood: Essentially Euthymic.  Affect: Mildly Constricted.  Anxiety Level: No anxiety reported or noted.  Thought Process: WNL.  Thought Content: The patient denies any current auditory or visual hallucinations or delusional thinking today.  Perception:. wnl.  Judgment: Fair to Good.  Insight: Fair to Good.  Cognition: Orientated to time, place and person.  Sleep:  Number of Hours: 6.5    Vital Signs:Blood pressure 145/84, pulse 96, temperature 98.5 F (36.9 C), temperature source Oral, resp. rate 20, height 5\' 2"  (1.575 m), weight 88.905 kg (196 lb). Current Medications: Current Facility-Administered Medications  Medication Dose Route Frequency Provider Last Rate Last Dose  . acetaminophen (TYLENOL) tablet 650 mg  650 mg Oral Q6H PRN Franchot Gallo, MD      . albuterol-ipratropium (COMBIVENT) inhaler 2 puff  2 puff Inhalation QID PRN Franchot Gallo, MD      . aspirin chewable tablet 81 mg  81 mg Oral Daily Franchot Gallo, MD   81 mg at 07/30/11 0809  . calcium-vitamin D (OSCAL WITH D) 500-200 MG-UNIT per  tablet 1 tablet  1 tablet Oral BH-q8a2phs Franchot Gallo, MD   1 tablet at 07/30/11 0810  . cholecalciferol (VITAMIN D) tablet 1,000 Units  1,000 Units Oral Daily Franchot Gallo, MD   1,000 Units at 07/30/11 0810  . clonazePAM (KLONOPIN) tablet 0.5 mg  0.5 mg Oral BH-qamhs Corrine Tillis, MD   0.5 mg at 07/30/11 0810  . diltiazem (CARDIZEM CD) 24 hr capsule 240 mg  240 mg Oral Daily Franchot Gallo, MD   240 mg at 07/30/11 0809  . divalproex (DEPAKOTE ER) 24 hr tablet 1,000 mg  1,000 mg Oral QHS Franchot Gallo, MD   1,000 mg at 07/29/11 2137  . divalproex (DEPAKOTE ER) 24 hr tablet 500 mg  500 mg Oral Q breakfast Franchot Gallo, MD   500 mg at 07/30/11 0809  . docusate sodium (COLACE) capsule 100 mg  100 mg Oral BH-qamhs Nikalas Bramel, MD   100 mg at 07/30/11 0810  . DULoxetine (CYMBALTA) DR capsule 60 mg  60 mg Oral Daily Franchot Gallo, MD   60 mg at 07/30/11 0809  . DULoxetine (CYMBALTA) DR capsule 60 mg  60 mg Oral Q1400 Franchot Gallo, MD   60 mg at 07/29/11 1431  . Fluticasone-Salmeterol (ADVAIR) 250-50 MCG/DOSE inhaler 1 puff  1 puff Inhalation Q12H Franchot Gallo, MD   1 puff at 07/30/11 0809  . gatorade (BH)  240 mL Oral BH-[id number] Franchot Gallo, MD   240 mL at 07/30/11 0811  . HYDROcodone-acetaminophen (NORCO) 5-325 MG per tablet 1 tablet  1 tablet Oral BID PRN Franchot Gallo, MD   1 tablet at 07/30/11 [phone]  . magnesium hydroxide (MILK OF MAGNESIA) suspension 30 mL  30 mL Oral Daily PRN Franchot Gallo, MD      . nicotine (NICODERM CQ - dosed in mg/24 hours) patch 21 mg  21 mg Transdermal Daily Franchot Gallo, MD   21 mg at 07/30/11 0811  . pantoprazole (PROTONIX) EC tablet 40 mg  40 mg Oral QHS Franchot Gallo, MD   40 mg at 07/29/11 2135  . perphenazine (TRILAFON) tablet 8 mg  8 mg Oral BH-qamhs Naeema Patlan, MD   8 mg at 07/30/11 0810  . senna-docusate (Senokot-S) tablet 1 tablet  1 tablet Oral Daily Franchot Gallo, MD   1 tablet at 07/28/11 [phone]  . simvastatin (ZOCOR) tablet 20 mg  20  mg Oral QHS Franchot Gallo, MD   20 mg at 07/29/11 2134   Lab Results:  Results for orders placed during the hospital encounter of 07/24/11 (from the past 48 hour(s))  BASIC METABOLIC PANEL     Status: Abnormal   Collection Time   07/29/11  7:00 AM      Component Value Range Comment   Sodium 128 (*) 135 - 145 (mEq/L)    Potassium 4.2  3.5 - 5.1 (mEq/L)    Chloride 92 (*) 96 - 112 (mEq/L)    CO2 27  19 - 32 (mEq/L)    Glucose, Bld 135 (*) 70 - 99 (mg/dL)    BUN 5 (*) 6 - 23 (mg/dL)    Creatinine, Ser 6.57 (*) 0.50 - 1.10 (mg/dL)    Calcium 9.3  8.4 - 10.5 (mg/dL)    GFR calc non Af Amer >90  >90 (mL/min)    GFR calc Af Amer >90  >90 (mL/min)    Time was spent today discussing with the patient her desire for discharge.  The patient reports that she is please PT recommended a Skilled Nursing Facility over a Group Home.  She is looking forward to placement as soon as possible.  Treatment Plan Summary:  1. Daily contact with patient to assess and evaluate symptoms and progress in treatment  2. Medication management  3. The patient will deny suicidal ideations or homicidal ideations for 48 hours prior to discharge and have a depression and anxiety rating of 3 or less. The patient will also deny any auditory or visual hallucinations or delusional thinking.   Plan:  1. Will continue current medications.  2. Will continue to monitor.  3. Will restart Sodium Chloride 1 gram q am, 2 pm and hs for hyponatremia.  4. Case Manager working on placement issue.   Heydi Swango 07/30/2011, 12:50 PM

## 2011-07-30 NOTE — Tx Team (Signed)
Interdisciplinary Treatment Plan Update (Adult)  Date:  07/30/2011  Time Reviewed:  10:15AM-11:00AM  Progress in Treatment: Attending groups:  Yes Participating in groups:    Yes Taking medication as prescribed:    Yes Tolerating medication:   Yes Family/Significant other contact made:  No, was made at last admission Patient understands diagnosis:   Yes, to some extent Discussing patient identified problems/goals with staff:   Yes Medical problems stabilized or resolved:   No, needs PT and SNF placement Denies suicidal/homicidal ideation:  Yes Issues/concerns per patient self-inventory:   None Other:  New problem(s) identified: No, Describe:    Reason for Continuation of Hospitalization: Medical Issues Medication stabilization Other; describe SNF placement  Interventions implemented related to continuation of hospitalization:  Medication monitoring and adjustment, safety checks Q15 min., suicide risk assessment, group therapy, psychoeducation, collateral contact, aftercare planning, ongoing physician assessments, medication education  Additional comments:  Not applicable  Estimated length of stay:  3-5 days  Discharge Plan:  Skilled Nursing Facility  New goal(s):  Not applicable  Review of initial/current patient goals per problem list:   1.  Goal(s):  Deny SI for 48 hours prior to D/C.  Met:  Yes  Target date:  By Discharge   As evidenced by:  Denied all weekend  2.  Goal(s):  Reduce depression from 10 at admission to no more than 3 at discharge.  Met:  Yes  Target date:  By Discharge   As evidenced by:  "0" today  3.  Goal(s):  Find placement where patient can go at discharge.  Met:  No  Target date:  By Discharge   As evidenced by:  Needs Skilled Nursing placement, [facility] needed first, has been applied for  4.  Goal(s):  Develop 3 coping skills for her anger.  Met:  No  Target date:  By Discharge   As evidenced by:  Still working on coping  skills  Attendees: Patient:  Did not attend   Family:     Physician:  Dr. Harvie Heck Readling 07/30/2011 10:30AM  Nursing:   Robbie Louis, RN 07/30/2011 10:30AM    Case Manager:  Ambrose Mantle, LCSW 07/30/2011 10:30AM  Counselor:  Veto Kemps, MT-BC 07/30/2011 10:30AM  Other:   Lynann Bologna, NP 07/30/2011 10:30AM  Other:      Other:      Other:       Scribe for Treatment Team:   Sarina Ser, 07/30/2011, 12:33 PM

## 2011-07-30 NOTE — Progress Notes (Signed)
Recreation Therapy Notes  07/30/2011         Time: 0930      Group Topic/Focus: The focus of this group is on enhancing the patient's understanding of leisure, barriers to leisure, and the importance of engaging in positive leisure activities upon discharge for improved total health.  Participation Level: Active  Participation Quality: Appropriate and Attentive  Affect: Appropriate  Cognitive: Oriented   Additional Comments: Patient reports she has been having a "good couple of days" and is waiting on placement.   Kenitha Glendinning 07/30/2011 10:04 AM

## 2011-07-30 NOTE — Progress Notes (Signed)
Pt was up in her room upon first assessment.  She was given her self-inventory to fill out.  She denied any depression, hopelessness or anxiety today.  In addition, she denied any S/H ideations or any A/V hallucinations today.  She has been educated and reeducated on her free water restriction today.  She had a pitcher of water upon first assessment this morning and we discussed her fluid intake at that time.  After lunch, she had an empty soda bottle with water in it so again we talked.  Basically, at that time, we talked about how she is an adult and she needs to take responsibility for her actions.  She has been counseled many times today and she knows she shouldn't have water and why.  She held her head down and apologized but she has continued to "beg" for gatorade, soda, coffee and such.  Pt will have CBG's checked TID before meals to see how she is doing with managing per Dr. Allena Katz and he ordered a hemoglobin A1c and CMET for this evening.  He did order her sodium chloride 1 gram TID with meals.  Discharge plan is for pt to go to SNF once she is ready for discharge.

## 2011-07-30 NOTE — Progress Notes (Signed)
Pt continues to complain about not being able to drink water and not getting her gatorade when she wants it. Pt was told her scheduled times for getting gatorade and also explained to pt why she was put on fluid restrictions. Pt went to wrap-up group and actively participated. Pt offered encouragement and support. Pt safety maintained on unit.

## 2011-07-30 NOTE — Progress Notes (Signed)
Patient ID: Natalie Thomas, female   DOB: [birth year], 51 y.o.   MRN: [id number] Has been out to the dayroom in her w/c for snacks, and to watch part of the game tonight.  Little interaction,with staff or peers except to get needs met.  Pushes the fluid limit for more gatorade, fluid restriction d/t low na. Came to the med window before going to bed, no c/o's at this time. Will continue to monitor.

## 2011-07-30 NOTE — Progress Notes (Signed)
[facility] Group Notes:  (Counselor/Nursing/MHT/Case Management/Adjunct)  07/30/2011 2:26 PM  Type of Therapy:  Group Therapy  Participation Level:  Active  Participation Quality:  Attentive,Sharing  Affect:  Appropriate  Cognitive:  Drowsy,Oriented  Insight:  Good  Engagement in Group:  Good  Engagement in Therapy:  Good  Modes of Intervention:  Education, Support  Summary of Progress/Problems: Pt verbalized that she was ready to go home but is currently waiting on placement. Pt verbalized that she is hoping that placement will be available within the next few days. Pt was insightful regarding the necessary supports she may need in reference to achieving her goals after discharge.    Maitri Schnoebelen, Aram Beecham 07/30/2011, 2:26 PM

## 2011-07-31 ENCOUNTER — Inpatient Hospital Stay (HOSPITAL_COMMUNITY): Payer: Medicare Other

## 2011-07-31 LAB — BASIC METABOLIC PANEL
CO2: 28 mEq/L (ref 19–32)
Calcium: 9.5 mg/dL (ref 8.4–10.5)
Chloride: 91 mEq/L — ABNORMAL LOW (ref 96–112)
Potassium: 4.4 mEq/L (ref 3.5–5.1)
Sodium: 128 mEq/L — ABNORMAL LOW (ref 135–145)

## 2011-07-31 LAB — GLUCOSE, CAPILLARY
Glucose-Capillary: 149 mg/dL — ABNORMAL HIGH (ref 70–99)
Glucose-Capillary: 132 mg/dL — ABNORMAL HIGH (ref 70–99)

## 2011-07-31 NOTE — Progress Notes (Signed)
Pt has been up and down asking for water or gatorade or ginger ale.  Staff has been redirecting her as needed, continually reminding her of her fluid restrictions d/t her hyponatremia.  Tried to encourage pt to think of other things besides getting something to drink.  Pt is frustrated and irritable with her situation, but is cooperative with going back to her room each time.  Safety maintained with q15 minute checks.

## 2011-07-31 NOTE — Progress Notes (Signed)
Patient ID: Natalie Thomas, female   DOB: December 22, 1960, 51 y.o.   MRN: [id number]  Natalie Thomas complains that she is agitated about being requested to restrict her fluids and she is not sure she can do this.  We have had quite a discussion about the purpose of restricting all fluids, and I have requested and challenged her to do her best with this and see what she can come up with.  Na++ is 128 today and Natalie Thomas continues to monitor her levels.  She is willing to try to do this.   She rates her depression a 3/10 today and denies hopelessness, suicidal thoughts, and sleep has been quite good.  Appetite is good.  She denies hallucinations.   We discussed her chronic back pain and the physical therapists recommendations.  LPT discussed with me their concerns that she could have new thoracic compression fx's due to her response on gait testing and ambulation.  We will get a fresh set of x-rays to rule out any new fractures before sending her for rehab at [facility] which PT recommended.  I have spoken with Natalie Thomas in radiology who feels best imaging would be obtained by starting with simple xrays of thoracic and lumbar spine and then go to MRI for further definition if new pathology is found.    Discussed plan with Natalie Thomas and she agrees with plan.

## 2011-07-31 NOTE — Progress Notes (Signed)
[facility] Group Notes:  (Counselor/Nursing/MHT/Case Management/Adjunct)  07/31/2011 10:05 AM  Type of Therapy:  Group Therapy  Participation Level:  Minimal  Participation Quality:  Attentive and Sharing  Affect:  Blunted  Cognitive:  Oriented  Insight:  Limited  Engagement in Group:  Limited  Engagement in Therapy:  Limited  Modes of Intervention:  Education and Support  Summary of Progress/Problems: Patient was in and out of group today. She left to go to the bathroom, and came back  about 15 minutes later. She had only been in the room 5 minutes when she left again and did not return. She did talk about how she takes some personal time for herself which included going ut for a smoking break, being outside, or going to her room for quiet time. She expressed being more agitated today but wasn't sure why.   Floetta Brickey, Aram Beecham 07/31/2011, 10:05 AM

## 2011-07-31 NOTE — Progress Notes (Signed)
Pt has been very apologetic for her past behaviors.  She stated,"I don't understand why I am feeling so agitated and angry for the past couple of days"  She went on to say that is not her "nature" to be angry and aggressive.  She did rate her depression a 3 hopelessness and anxiety both a 2 today.  She denies any S/H ideation or A/V hallucinations.  She did go into group therapy this morning and came out a couple of times and finally asked to speak with this nurse in private.  Pt stated,"I think I know why I have been feeling so agitated"  She went on to say that one of her peers' jacket "smelt like drugs" and that was making her crave and be agitated.  She then reached into her shirt and handed a piece of a cigarette that had been lit and smoked at some point.  She denied she ever smoked or even had matches or lighter to light anything.  She claims that the same peer she complained smelled of drugs gave her the cigarette.  She stated,"I have just been putting it in my mouth and pretending to smoke" ans she went on to say that she didn't want to get anyone in trouble or herself in trouble either.  Thanked her for her honesty.  She did receive a vicodin for pian this morning at 0817 with effective results reported.  She has been asking for the gatorade again today but she has been able to take redirection much better today.  Informed her that this nurse has spoken with Seward Grater to see if pt could have gatorade more often or not.  Pt did voice understanding and thus far has been able to be redirected.

## 2011-07-31 NOTE — Progress Notes (Signed)
Pt's mood is improved from earlier this morning.  She is still requesting gatorade out of scheduled times, but is more easily redirectable.    She requested Norco for her chronic back pain which was given after she returned from having back x-rays.  She denies SI/HI.  Will continue to support pt with her fluid(water) restrictions and emphasize her need for compliance.  She is looking forward to going to a SNF.  Safety maintained with q15 minute checks.

## 2011-08-01 LAB — COMPREHENSIVE METABOLIC PANEL
AST: 10 U/L (ref 0–37)
CO2: 28 mEq/L (ref 19–32)
Calcium: 8.9 mg/dL (ref 8.4–10.5)
Creatinine, Ser: 0.42 mg/dL — ABNORMAL LOW (ref 0.50–1.10)
GFR calc Af Amer: >90 mL/min (ref >90)
GFR calc non Af Amer: >90 mL/min (ref >90)

## 2011-08-01 LAB — GLUCOSE, CAPILLARY: Glucose-Capillary: 178 mg/dL — ABNORMAL HIGH (ref 70–99)

## 2011-08-01 MED ORDER — SODIUM CHLORIDE 1 G PO TABS
2.0000 g | ORAL_TABLET | Freq: Three times a day (TID) | ORAL | Status: DC
  Administered 2011-08-01 – 2011-08-06 (×15): 2 g via ORAL
  Filled 2011-08-01 (×5): qty 2
  Filled 2011-08-01: qty 14
  Filled 2011-08-01 (×9): qty 2
  Filled 2011-08-01: qty 14
  Filled 2011-08-01 (×3): qty 2
  Filled 2011-08-01: qty 14

## 2011-08-01 NOTE — Progress Notes (Addendum)
Patient ID: Natalie Thomas, female   DOB: Sep 10, 1960, 51 y.o.   MRN: [id number]   Informal discussion with Dr. Robynn Pane, [facility], on call for [facility]; to review thoracic and lumbar spine films. Natalie Thomas complains of back pain in L1 region, no thoracic pain, and is neurologically intact.  He does not recommend any further imaging at this point. 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  Also suggests she could benefit from lumbar corset for support.    P: Will attempt to get a lumbar corset.  Will schedule with PCP Romero Belling MD., [facility].  Ortho tech says we will need to call outside vendor for fitting: [employer] [phone]. Will review with team and patientrprior to placing order.

## 2011-08-01 NOTE — Progress Notes (Signed)
[facility] Group Notes:  (Counselor/Nursing/MHT/Case Management/Adjunct)  08/01/2011 1:14 PM  Type of Therapy:  Group Therapy  Participation Level:  Active  Participation Quality:  Attentive and Sharing  Affect:  Appropriate  Cognitive:  Oriented  Insight:  Limited  Engagement in Group:  Good  Engagement in Therapy:  Good  Modes of Intervention:  Education, Problem-solving and Support  Summary of Progress/Problems: Patient stated that someone came to see her yesterday and she has a Sales promotion account executive number. Wondering when she will get to go to a nursing home. Counselor explained process. Patient requested to talk to case manager covering for [place] [facility]. Feels ready to go.   Audreana Hancox, Aram Beecham 08/01/2011, 1:14 PM

## 2011-08-01 NOTE — Progress Notes (Signed)
[facility] Group Notes:  (Counselor/Nursing/MHT/Case Management/Adjunct)  08/01/2011 1:19 PM  Type of Therapy:  Psychoeducational Skills  Participation Level:  Active  Participation Quality:  Appropriate  Affect:  Appropriate  Cognitive:  Oriented  Insight:  Limited  Engagement in Group:  Good  Engagement in Therapy:  Good  Modes of Intervention:  Education, support  Summary of Progress/Problems: Patient was attentive to speaker from mental health association. She could relate to what speaker shared about his illness.   Shaton Lore, Aram Beecham 08/01/2011, 1:19 PM

## 2011-08-01 NOTE — Discharge Planning (Signed)
Was informed we had a PASSR number by staff, even though I was unable to find confirmation.  Contacted [facility] and [facility]  And faxed them info. On pt for possible placement.

## 2011-08-01 NOTE — Progress Notes (Signed)
Pt in her room at this time, lying in bed.  Pt has no complaints at this time.  Lab is here to draw blood for Na+ level.  Pt is in better mood now that she can get gatorade on  Request most any time.  Pt denies SI/HI.  Discussed with pt when she will get her hs meds.  Pt still using her wheelchair for ambulation d/t unsteady gait.  Looking forward to being discharged to a SNF.   Safety maintained with q15 minute checks.

## 2011-08-01 NOTE — Progress Notes (Signed)
Recreation Therapy Notes  08/01/2011         Time: 0930      Group Topic/Focus: The focus of the group is on enhancing the patients' ability to cope with stressors by understanding what coping is, why it is important, the negative effects of stress and developing healthier coping skills.   Participation Level: Minimal  Participation Quality: Redirectable and Resistant  Affect: Labile  Cognitive: Oriented   Additional Comments: Patient argumentative with staff, yelling, and when confronted about yelling patient denied it, and said the staff at the assisted living facility must have told us she was yelling because they lie. Patient would not calm down, demanded to leave hospital, RT reminded patient she was waiting on placement, patient became more irritable, accused RT of breaking confidentiality, and started demanding RT be fired. Patient left to meet with PA, and was significantly calmer when she returned, participated appropriately.   Stevey Stapleton 08/01/2011 10:20 AM

## 2011-08-01 NOTE — Progress Notes (Signed)
Patient ID: Natalie Thomas, female   DOB: 1961/02/01, 51 y.o.   MRN: [id number]   Patient smiling on approach but has been labile this am with staff. Got upset because she couldn't get her gatorade when she wanted it and was upset when she could not get two cups of coffee. Denies any feelings of hopelessness or depression. Denies any SI/HI or a/v hallucinations. Patient states her main concern is placement issues. Staff will continue to monitor and encourage group attendance.

## 2011-08-01 NOTE — Progress Notes (Signed)
[facility] MD Progress Note  08/01/2011 12:28 PM  Diagnosis:  Axis I: Schizoaffective Disorder - Bipolar Type.   The patient was seen today and reports the following:   ADL's: Intact.  Sleep: The patient reports to continuing to sleep well at night.  Appetite: The patient continues to report a good appetite.   Mild>(1-10) >Severe  Hopelessness (1-10): 0  Depression (1-10): 0  Anxiety (1-10): 0  Pain (1-10): 1   Suicidal Ideation: The patient adamantly denies any suicidal ideations today.  Plan: No  Intent: No  Means: No   Homicidal Ideation: The patient adamantly denies any homicidal ideations today.  Plan: No  Intent: No.  Means: No   General Appearance /Behavior: Casual and cooperative.  Eye Contact: Good.  Speech: Appropriate in rate and volume.  Motor Behavior: Appropriate.  Level of Consciousness: Alert and Oriented x 3.  Mental Status: Alert and Oriented x 3.  Mood: Essentially Euthymic.  Affect: Slightly Constricted.  Anxiety Level: No anxiety reported or noted.  Thought Process: WNL.  Thought Content: The patient denies any auditory or visual hallucinations or delusional thinking today.  Perception:. wnl.  Judgment: Fair to Good.  Insight: Fair to Good.  Cognition: Orientated to time, place and person.  Sleep:  Number of Hours: 6.5    Vital Signs:Blood pressure 130/82, pulse 94, temperature 97.8 F (36.6 C), temperature source Oral, resp. rate 18, height 5\' 2"  (1.575 m), weight 88.905 kg (196 lb). Current Medications: Current Facility-Administered Medications  Medication Dose Route Frequency Provider Last Rate Last Dose  . acetaminophen (TYLENOL) tablet 650 mg  650 mg Oral Q6H PRN Franchot Gallo, MD      . albuterol-ipratropium (COMBIVENT) inhaler 2 puff  2 puff Inhalation QID PRN Franchot Gallo, MD      . aspirin chewable tablet 81 mg  81 mg Oral Daily Franchot Gallo, MD   81 mg at 08/01/11 0803  . calcium-vitamin D (OSCAL WITH D) 500-200 MG-UNIT per tablet 1  tablet  1 tablet Oral [id number] Franchot Gallo, MD   1 tablet at 08/01/11 0804  . cholecalciferol (VITAMIN D) tablet 1,000 Units  1,000 Units Oral Daily Franchot Gallo, MD   1,000 Units at 08/01/11 0803  . clonazePAM (KLONOPIN) tablet 0.5 mg  0.5 mg Oral BH-qamhs Deanda Ruddell, MD   0.5 mg at 08/01/11 0805  . diltiazem (CARDIZEM CD) 24 hr capsule 240 mg  240 mg Oral Daily Franchot Gallo, MD   240 mg at 08/01/11 0804  . divalproex (DEPAKOTE ER) 24 hr tablet 1,000 mg  1,000 mg Oral QHS Franchot Gallo, MD   1,000 mg at 07/31/11 2139  . divalproex (DEPAKOTE ER) 24 hr tablet 500 mg  500 mg Oral Q breakfast Franchot Gallo, MD   500 mg at 08/01/11 0804  . docusate sodium (COLACE) capsule 100 mg  100 mg Oral BH-qamhs Jasper Ruminski, MD   100 mg at 08/01/11 0804  . DULoxetine (CYMBALTA) DR capsule 60 mg  60 mg Oral Daily Franchot Gallo, MD   60 mg at 08/01/11 0803  . DULoxetine (CYMBALTA) DR capsule 60 mg  60 mg Oral Q1400 Franchot Gallo, MD   60 mg at 07/31/11 1334  . Fluticasone-Salmeterol (ADVAIR) 250-50 MCG/DOSE inhaler 1 puff  1 puff Inhalation Q12H Franchot Gallo, MD   1 puff at 08/01/11 0805  . HYDROcodone-acetaminophen (NORCO) 5-325 MG per tablet 1 tablet  1 tablet Oral BID PRN Franchot Gallo, MD   1 tablet at 08/01/11 1130  . magnesium hydroxide (  MILK OF MAGNESIA) suspension 30 mL  30 mL Oral Daily PRN Franchot Gallo, MD      . nicotine (NICODERM CQ - dosed in mg/24 hours) patch 21 mg  21 mg Transdermal Daily Franchot Gallo, MD   21 mg at 08/01/11 0810  . pantoprazole (PROTONIX) EC tablet 40 mg  40 mg Oral QHS Franchot Gallo, MD   40 mg at 07/31/11 2138  . perphenazine (TRILAFON) tablet 8 mg  8 mg Oral BH-qamhs Franchot Gallo, MD   8 mg at 08/01/11 0803  . senna-docusate (Senokot-S) tablet 1 tablet  1 tablet Oral Daily Franchot Gallo, MD   1 tablet at 08/01/11 0803  . simvastatin (ZOCOR) tablet 20 mg  20 mg Oral QHS Franchot Gallo, MD   20 mg at 07/31/11 2138  . sodium chloride tablet 1 g  1 g  Oral TID WC Franchot Gallo, MD   1 g at 08/01/11 1130  . DISCONTD: gatorade (BH)  240 mL Oral BH-[id number] Franchot Gallo, MD   240 mL at 08/01/11 0809   Lab Results:  Results for orders placed during the hospital encounter of 07/24/11 (from the past 48 hour(s))  GLUCOSE, CAPILLARY     Status: Abnormal   Collection Time   07/30/11  4:44 PM      Component Value Range Comment   Glucose-Capillary 128 (*) 70 - 99 (mg/dL)   COMPREHENSIVE METABOLIC PANEL     Status: Abnormal   Collection Time   07/30/11  7:42 PM      Component Value Range Comment   Sodium 126 (*) 135 - 145 (mEq/L)    Potassium 3.9  3.5 - 5.1 (mEq/L)    Chloride 89 (*) 96 - 112 (mEq/L)    CO2 28  19 - 32 (mEq/L)    Glucose, Bld 111 (*) 70 - 99 (mg/dL)    BUN 5 (*) 6 - 23 (mg/dL)    Creatinine, Ser 1.61 (*) 0.50 - 1.10 (mg/dL)    Calcium 9.0  8.4 - 10.5 (mg/dL)    Total Protein 6.7  6.0 - 8.3 (g/dL)    Albumin 3.2 (*) 3.5 - 5.2 (g/dL)    AST 11  0 - 37 (U/L)    ALT 11  0 - 35 (U/L)    Alkaline Phosphatase 69  39 - 117 (U/L)    Total Bilirubin 0.1 (*) 0.3 - 1.2 (mg/dL)    GFR calc non Af Amer >90  >90 (mL/min)    GFR calc Af Amer >90  >90 (mL/min)   HEMOGLOBIN A1C     Status: Normal   Collection Time   07/30/11  7:42 PM      Component Value Range Comment   Hemoglobin A1C 5.6  <5.7 (%)    Mean Plasma Glucose 114  <117 (mg/dL)   BASIC METABOLIC PANEL     Status: Abnormal   Collection Time   07/31/11  6:21 AM      Component Value Range Comment   Sodium 128 (*) 135 - 145 (mEq/L)    Potassium 4.4  3.5 - 5.1 (mEq/L)    Chloride 91 (*) 96 - 112 (mEq/L)    CO2 28  19 - 32 (mEq/L)    Glucose, Bld 80  70 - 99 (mg/dL)    BUN 5 (*) 6 - 23 (mg/dL)    Creatinine, Ser 0.96 (*) 0.50 - 1.10 (mg/dL)    Calcium 9.5  8.4 - 10.5 (mg/dL)    GFR calc non Af Amer >  90  >90 (mL/min)    GFR calc Af Amer >90  >90 (mL/min)   GLUCOSE, CAPILLARY     Status: Abnormal   Collection Time   07/31/11 11:35 AM      Component Value Range Comment    Glucose-Capillary 149 (*) 70 - 99 (mg/dL)   GLUCOSE, CAPILLARY     Status: Abnormal   Collection Time   07/31/11  6:28 PM      Component Value Range Comment   Glucose-Capillary 132 (*) 70 - 99 (mg/dL)   GLUCOSE, CAPILLARY     Status: Abnormal   Collection Time   07/31/11  9:33 PM      Component Value Range Comment   Glucose-Capillary 124 (*) 70 - 99 (mg/dL)   GLUCOSE, CAPILLARY     Status: Normal   Collection Time   08/01/11  6:22 AM      Component Value Range Comment   Glucose-Capillary 78  70 - 99 (mg/dL)   GLUCOSE, CAPILLARY     Status: Abnormal   Collection Time   08/01/11 11:34 AM      Component Value Range Comment   Glucose-Capillary 178 (*) 70 - 99 (mg/dL)    IMPRESSION:  Progression of L1 compression fracture when compared with  02/16/2011. Gradient 50% loss vertebral body height. Previous  sacroplasty. Stable findings L3.  Per CMS PQRS reporting requirements (PQRS Measure 24): Given the  patient's age of greater than 50 and the fracture site (hip, distal  radius, or spine), the patient should be tested for osteoporosis  using DXA, and the appropriate treatment considered based on the  DXA results.  Original Report Authenticated By: Elsie Stain, M.D.   IMPRESSION:  Multiple compression fractures as described, including T3, T5, T9,  and T11. These are likely chronic except for T11 which may be  indeterminate. If there are no contraindications, MRI could be  helpful in further evaluation to assess for bone marrow edema.  Per CMS PQRS reporting requirements (PQRS Measure 24): Given the  patient's age of greater than 50 and the fracture site (hip, distal  radius, or spine), the patient should be tested for osteoporosis  using DXA, and the appropriate treatment considered based on the  DXA results.    Time was spent discussing with the patient her desire for discharge.  The patient was assured that progress is being made in finding her placement.  The patient was also  encouraged to restrict her free water intake secondary to her hyponatremia.  Treatment Plan Summary:  1. Daily contact with patient to assess and evaluate symptoms and progress in treatment  2. Medication management  3. The patient will deny suicidal ideations or homicidal ideations for 48 hours prior to discharge and have a depression and anxiety rating of 3 or less. The patient will also deny any auditory or visual hallucinations or delusional thinking.   Plan:  1. Will continue current medications.  2. Will continue to monitor.  3. Case Manager is working on placement issue.  4. Will continue to monitor hyponatremia.  Natalie Thomas 08/01/2011, 12:28 PM

## 2011-08-02 LAB — GLUCOSE, CAPILLARY
Glucose-Capillary: 190 mg/dL — ABNORMAL HIGH (ref 70–99)
Glucose-Capillary: 118 mg/dL — ABNORMAL HIGH (ref 70–99)

## 2011-08-02 LAB — COMPREHENSIVE METABOLIC PANEL
Albumin: 3.1 g/dL — ABNORMAL LOW (ref 3.5–5.2)
BUN: 6 mg/dL (ref 6–23)
Calcium: 8.9 mg/dL (ref 8.4–10.5)
Creatinine, Ser: 0.43 mg/dL — ABNORMAL LOW (ref 0.50–1.10)
Potassium: 4.3 mEq/L (ref 3.5–5.1)
Total Protein: 6.4 g/dL (ref 6.0–8.3)

## 2011-08-02 MED ORDER — INSULIN ASPART 100 UNIT/ML ~~LOC~~ SOLN
0.0000 [IU] | Freq: Three times a day (TID) | SUBCUTANEOUS | Status: DC
  Administered 2011-08-03: 3 [IU] via SUBCUTANEOUS
  Administered 2011-08-05 (×2): 2 [IU] via SUBCUTANEOUS
  Filled 2011-08-02: qty 3

## 2011-08-02 NOTE — Progress Notes (Signed)
[facility] Group Notes:  (Counselor/Nursing/MHT/Case Management/Adjunct)  08/02/2011 12:40 PM  Type of Therapy:  Group Therapy  Participation Level:  Active  Participation Quality:  Attentive  Affect:  Appropriate  Cognitive:  Oriented  Insight:  Limited  Engagement in Group:  Good  Engagement in Therapy:  Good  Modes of Intervention:  Education, Problem-solving and Support  Summary of Progress/Problems: Patient stated that she thinks she will not go back to [employer] for activities through the day. Instead, she will get involved in activities at the nursing home. Counselor told her to wait and see where she was placed before making this decision. Told her that she would need to stay busy. Patient continues to wait for someone to interview her for nursing home. Patient stated that she eventually wants to live independently, but counselor told her that she is actually needing more care at this point rather than less. Encouraged her to make things work wherever she goes so she doesn't have to come back to the hospital.   Veto Kemps 08/02/2011, 12:40 PM

## 2011-08-02 NOTE — Progress Notes (Signed)
[facility] Group Notes:  (Counselor/Nursing/MHT/Case Management/Adjunct)  08/02/2011 12:45 PM  Type of Therapy:  Music Therapy  Participation Level:  Active  Participation Quality:  Appropriate  Affect:  Appropriate  Cognitive:  Oriented  Insight:  Good  Engagement in Group:  Good  Engagement in Therapy:  Good  Modes of Intervention:  Activity, Socialization, Support and Music  Summary of Progress/Problems: Patient had requested that therapist play music in group today. She was active in [employer], and sang to some of her favorites. She exhibited bright affect and was very appreciative of the session. Appeared to benefit also from the interactions with her peers.   Natalie Thomas 08/02/2011, 12:45 PM

## 2011-08-02 NOTE — Progress Notes (Signed)
[facility] MD Progress Note  08/02/2011 1:25 PM  Diagnosis:  Axis I: Schizoaffective Disorder - Bipolar Type.   The patient was seen today and reports the following:   ADL's: Intact.  Sleep: The patient reports to continuing to sleep well at night.  Appetite: The patient continues to report a good appetite.   Mild>(1-10) >Severe  Hopelessness (1-10): 0  Depression (1-10): 0  Anxiety (1-10): 2  Pain (1-10): 1   Suicidal Ideation: The patient adamantly denies any suicidal ideations today.  Plan: No  Intent: No  Means: No   Homicidal Ideation: The patient adamantly denies any homicidal ideations today.  Plan: No  Intent: No.  Means: No   General Appearance /Behavior: Casual and cooperative.  Eye Contact: Good.  Speech: Appropriate in rate and volume.  Motor Behavior: Appropriate.  Level of Consciousness: Alert and Oriented x 3.  Mental Status: Alert and Oriented x 3.  Mood: Essentially Euthymic.  Affect: Slightly Constricted.  Anxiety Level: Mild anxiety reported mostly related to placement unknowns. Thought Process: WNL.  Thought Content: The patient denies any auditory or visual hallucinations or delusional thinking today.  Perception:. wnl.  Judgment: Fair to Good.  Insight: Fair to Good.  Cognition: Orientated to time, place and person.  Sleep:  Number of Hours: 6.75    Vital Signs:Blood pressure 137/83, pulse 89, temperature 96.8 F (36 C), temperature source Oral, resp. rate 18, height 5\' 2"  (1.575 m), weight 88.905 kg (196 lb).  Current Medications: Current Facility-Administered Medications  Medication Dose Route Frequency Provider Last Rate Last Dose  . acetaminophen (TYLENOL) tablet 650 mg  650 mg Oral Q6H PRN Franchot Gallo, MD      . albuterol-ipratropium (COMBIVENT) inhaler 2 puff  2 puff Inhalation QID PRN Franchot Gallo, MD      . aspirin chewable tablet 81 mg  81 mg Oral Daily Franchot Gallo, MD   81 mg at 08/02/11 0803  . calcium-vitamin D (OSCAL WITH D)  500-200 MG-UNIT per tablet 1 tablet  1 tablet Oral [id number] Franchot Gallo, MD   1 tablet at 08/02/11 0802  . cholecalciferol (VITAMIN D) tablet 1,000 Units  1,000 Units Oral Daily Franchot Gallo, MD   1,000 Units at 08/02/11 0802  . clonazePAM (KLONOPIN) tablet 0.5 mg  0.5 mg Oral BH-qamhs Nickoles Gregori, MD   0.5 mg at 08/02/11 0806  . diltiazem (CARDIZEM CD) 24 hr capsule 240 mg  240 mg Oral Daily Franchot Gallo, MD   240 mg at 08/02/11 0804  . divalproex (DEPAKOTE ER) 24 hr tablet 1,000 mg  1,000 mg Oral QHS Franchot Gallo, MD   1,000 mg at 08/01/11 2138  . divalproex (DEPAKOTE ER) 24 hr tablet 500 mg  500 mg Oral Q breakfast Franchot Gallo, MD   500 mg at 08/02/11 0803  . docusate sodium (COLACE) capsule 100 mg  100 mg Oral BH-qamhs Canisha Issac, MD   100 mg at 08/02/11 0802  . DULoxetine (CYMBALTA) DR capsule 60 mg  60 mg Oral Daily Franchot Gallo, MD   60 mg at 08/02/11 0804  . DULoxetine (CYMBALTA) DR capsule 60 mg  60 mg Oral Q1400 Franchot Gallo, MD   60 mg at 08/01/11 1416  . Fluticasone-Salmeterol (ADVAIR) 250-50 MCG/DOSE inhaler 1 puff  1 puff Inhalation Q12H Franchot Gallo, MD   1 puff at 08/02/11 0801  . HYDROcodone-acetaminophen (NORCO) 5-325 MG per tablet 1 tablet  1 tablet Oral BID PRN Franchot Gallo, MD   1 tablet at 08/02/11 0806  .  insulin aspart (novoLOG) injection 0-15 Units  0-15 Units Subcutaneous TID WC Viviann Spare, NP      . magnesium hydroxide (MILK OF MAGNESIA) suspension 30 mL  30 mL Oral Daily PRN Franchot Gallo, MD      . nicotine (NICODERM CQ - dosed in mg/24 hours) patch 21 mg  21 mg Transdermal Daily Franchot Gallo, MD   21 mg at 08/01/11 0810  . pantoprazole (PROTONIX) EC tablet 40 mg  40 mg Oral QHS Franchot Gallo, MD   40 mg at 08/01/11 2138  . perphenazine (TRILAFON) tablet 8 mg  8 mg Oral BH-qamhs [place] Skalicky, MD   8 mg at 08/02/11 0802  . senna-docusate (Senokot-S) tablet 1 tablet  1 tablet Oral Daily Franchot Gallo, MD   1 tablet at 08/02/11 0805    . simvastatin (ZOCOR) tablet 20 mg  20 mg Oral QHS Franchot Gallo, MD   20 mg at 08/01/11 2138  . sodium chloride tablet 2 g  2 g Oral TID WC Franchot Gallo, MD   2 g at 08/02/11 1154   Lab Results:  Results for orders placed during the hospital encounter of 07/24/11 (from the past 48 hour(s))  GLUCOSE, CAPILLARY     Status: Abnormal   Collection Time   07/31/11  6:28 PM      Component Value Range Comment   Glucose-Capillary 132 (*) 70 - 99 (mg/dL)   GLUCOSE, CAPILLARY     Status: Abnormal   Collection Time   07/31/11  9:33 PM      Component Value Range Comment   Glucose-Capillary 124 (*) 70 - 99 (mg/dL)   GLUCOSE, CAPILLARY     Status: Normal   Collection Time   08/01/11  6:22 AM      Component Value Range Comment   Glucose-Capillary 78  70 - 99 (mg/dL)   GLUCOSE, CAPILLARY     Status: Abnormal   Collection Time   08/01/11 11:34 AM      Component Value Range Comment   Glucose-Capillary 178 (*) 70 - 99 (mg/dL)   GLUCOSE, CAPILLARY     Status: Abnormal   Collection Time   08/01/11  4:33 PM      Component Value Range Comment   Glucose-Capillary 137 (*) 70 - 99 (mg/dL)   COMPREHENSIVE METABOLIC PANEL     Status: Abnormal   Collection Time   08/01/11  8:06 PM      Component Value Range Comment   Sodium 126 (*) 135 - 145 (mEq/L)    Potassium 4.0  3.5 - 5.1 (mEq/L)    Chloride 90 (*) 96 - 112 (mEq/L)    CO2 28  19 - 32 (mEq/L)    Glucose, Bld 111 (*) 70 - 99 (mg/dL)    BUN 6  6 - 23 (mg/dL)    Creatinine, Ser 4.09 (*) 0.50 - 1.10 (mg/dL)    Calcium 8.9  8.4 - 10.5 (mg/dL)    Total Protein 6.5  6.0 - 8.3 (g/dL)    Albumin 3.0 (*) 3.5 - 5.2 (g/dL)    AST 10  0 - 37 (U/L)    ALT 10  0 - 35 (U/L)    Alkaline Phosphatase 65  39 - 117 (U/L)    Total Bilirubin 0.1 (*) 0.3 - 1.2 (mg/dL)    GFR calc non Af Amer >90  >90 (mL/min)    GFR calc Af Amer >90  >90 (mL/min)   GLUCOSE, CAPILLARY     Status: Abnormal  Collection Time   08/02/11  6:40 AM      Component Value Range Comment    Glucose-Capillary 190 (*) 70 - 99 (mg/dL)   GLUCOSE, CAPILLARY     Status: Abnormal   Collection Time   08/02/11 11:53 AM      Component Value Range Comment   Glucose-Capillary 118 (*) 70 - 99 (mg/dL)    IMPRESSION:  Progression of L1 compression fracture when compared with  02/16/2011. Gradient 50% loss vertebral body height. Previous  sacroplasty. Stable findings L3.  Per CMS PQRS reporting requirements (PQRS Measure 24): Given the  patient's age of greater than 50 and the fracture site (hip, distal  radius, or spine), the patient should be tested for osteoporosis  using DXA, and the appropriate treatment considered based on the  DXA results.  Original Report Authenticated By: Elsie Stain, M.D.    IMPRESSION:  Multiple compression fractures as described, including T3, T5, T9,  and T11. These are likely chronic except for T11 which may be  indeterminate. If there are no contraindications, MRI could be  helpful in further evaluation to assess for bone marrow edema.  Per CMS PQRS reporting requirements (PQRS Measure 24): Given the  patient's age of greater than 50 and the fracture site (hip, distal  radius, or spine), the patient should be tested for osteoporosis  using DXA, and the appropriate treatment considered based on the  DXA results.    Time was spent discussing with the patient her ongoing desire for discharge. The patient's case manager reported that at least one Nursing facility is planning the evaluate the patient either today or tomorrow.  It was also discussed with the patient the ongoing need to limit her free water intake and patient agreed to comply.  Treatment Plan Summary:  1. Daily contact with patient to assess and evaluate symptoms and progress in treatment  2. Medication management  3. The patient will deny suicidal ideations or homicidal ideations for 48 hours prior to discharge and have a depression and anxiety rating of 3 or less. The patient will also deny  any auditory or visual hallucinations or delusional thinking.   Plan:  1. Will continue current medications.  2. Will continue to monitor.  3. Case Manager is working on placement issue.  4. Will continue to monitor hyponatremia. 5. Will change Gatorade to sugar free.  Denecia Brunette 08/02/2011, 1:25 PM

## 2011-08-02 NOTE — Progress Notes (Signed)
Pt has been up and has been visible in unit, has been participating in various milieu activities this evening, pt spoke about her ongoing situation of looking for a place to go upon discharge, she is unsure at this time where she will be going, pt denies any SI, has received all medications without incident, support provided, will continue to monitor

## 2011-08-02 NOTE — Tx Team (Signed)
Interdisciplinary Treatment Plan Update (Adult)  Date:  08/02/2011  Time Reviewed:  3:26 PM   Progress in Treatment: Attending groups: Yes Participating in groups:  Yes Taking medication as prescribed: Yes Tolerating medication:  Yes Family/Significant othe contact made:  Yes Patient understands diagnosis:  Yes Discussing patient identified problems/goals with staff:  Yes Medical problems stabilized or resolved:  Yes Denies suicidal/homicidal ideation: Yes Issues/concerns per patient self-inventory:  None identified Other: N/A  New problem(s) identified: None Identified  Reason for Continuation of Hospitalization: Other; describe placement issue   Interventions implemented related to continuation of hospitalization: mood stabilization, medication monitoring and adjustment, group therapy and psycho education, safety checks q 15 mins  Additional comments: N/A  Estimated length of stay:  4-5 days  Discharge Plan: [name] to visit pt on Monday, SW has reached out to other facilities as well.  Pt was declined from [facility] and [place].  New goal(s): N/A  Review of initial/current patient goals per problem list:    1. Goal(s): Find placement where patient can go at discharge.  Met: No  Target date: By Discharge  As evidenced by: Needs Skilled Nursing placement, [facility]ARR # received  2. Goal(s): Develop 3 coping skills for her anger.  Met: No  Target date: By Discharge  As evidenced by: Still working on coping skills by attending groups and actively participating   Attendees: Patient:     Family:     Physician:  Franchot Gallo, MD 08/02/2011 3:26 PM   Nursing: Jones Skene, RN 08/02/2011 3:29 PM   Case Manager:  Reyes Ivan, LCSWA 08/02/2011 3:26 PM   Counselor:  Veto Kemps, MT-BC 08/02/2011 3:26 PM   Other:  Nanine Means, RN 08/02/2011 3:26 PM   Other:     Other:     Other:      Scribe for Treatment Team:   Carmina Miller, 08/02/2011, 3:26  PM

## 2011-08-02 NOTE — Progress Notes (Signed)
Patient ID: Natalie Thomas, female   DOB: 02-20-1961, 51 y.o.   MRN: [id number] Pt denies SI/HI/AVH.  She rated her sleep as well last night, good appetite, normal energy level, no depression or anxiety, and her ability to pay attention is improving.  Alenah rates her pain as a 3-4 for lower back pain, relieved with her pain meds this am.  She is awaiting placement.

## 2011-08-02 NOTE — Discharge Planning (Signed)
Today we were informed by SNF that pt does not appear to meet criteria for that level of service.  Contacted [facility] and [facility] for possible placement.

## 2011-08-03 LAB — GLUCOSE, CAPILLARY
Glucose-Capillary: 110 mg/dL — ABNORMAL HIGH (ref 70–99)
Glucose-Capillary: 154 mg/dL — ABNORMAL HIGH (ref 70–99)

## 2011-08-03 MED ORDER — SODIUM CHLORIDE 1 G PO TABS
2.0000 g | ORAL_TABLET | ORAL | Status: AC
  Administered 2011-08-03: 2 g via ORAL
  Filled 2011-08-03: qty 2

## 2011-08-03 MED ORDER — OXYBUTYNIN CHLORIDE ER 5 MG PO TB24
5.0000 mg | ORAL_TABLET | Freq: Every day | ORAL | Status: DC
  Administered 2011-08-03 – 2011-08-05 (×3): 5 mg via ORAL
  Filled 2011-08-03 (×3): qty 1
  Filled 2011-08-03: qty 3

## 2011-08-03 NOTE — Discharge Planning (Signed)
[facility] declined pt.  [name] to review on Monday.

## 2011-08-03 NOTE — Progress Notes (Signed)
[facility] Group Notes:  (Counselor/Nursing/MHT/Case Management/Adjunct)  08/03/2011 12:23 PM  Type of Therapy:  Group Therapy  Participation Level:  Active  Participation Quality:  Appropriate  Affect:  Appropriate  Cognitive:  Oriented  Insight:  Limited  Engagement in Group:  Good  Engagement in Therapy:  Good  Modes of Intervention:  Clarification, Education, Problem-solving and Support  Summary of Progress/Problems: Patient continues to be hopeful that she will find housing. She stated that case manager was looking at nursing facilities and assisted living. She named one place that sounded promising. She stated that wherever she stayed, she hoped it was a loving and caring place. She also talked about her aunt and uncle being in [place] and [employer], and hoped she could still be close to family. Patient was reminded that she would have to do her part in making things work at the facility.   [name] 08/03/2011, 12:23 PM

## 2011-08-03 NOTE — Progress Notes (Signed)
SW made referral to [facility] and [facility] as possible living arrangements for pt. Both opted not to take pt. Awaiting response from [facility] on Monday. Will continue to explore placement options for pt.

## 2011-08-03 NOTE — Progress Notes (Signed)
Recreation Therapy Notes  08/03/2011         Time: 1125      Group Topic/Focus: The focus of the group is on enhancing the patients' ability to cope with stressors by understanding what coping is, why it is important, the negative effects of stress and developing healthier coping skills.  Participation Level: Active  Participation Quality: Attentive  Affect: Appropriate  Cognitive: Oriented   Additional Comments: Patient reports she is hopeful that she will be discharged today.   Natalie Thomas 08/03/2011 12:19 PM

## 2011-08-03 NOTE — Progress Notes (Signed)
Patient ID: [name], female   DOB: 12-29-1960, 51 y.o.   MRN: [id number] 08/03/2011  Nursing 1930 D Natalie Thomas remains having a ahrd time with staff attempting to limit / restrict her free water intake. Sodium down to 124 and MD ordered sugar free  gatorade given to pt. Staff given instructions to keep pt away from water fountain. Take ice pitcher with ice. And this was done. She was given extra dose of sodium 2 gm tablets ( one time order) and was instructed by MD and staff to restict her water intake. Pt and staff instructed NO crystal light. A She completed her self inventory earlier this AM and on it she wrote that she denied SI, she rated her feelings of depression and hopelessness " o / o " and she had periodic outburts at staff and MD when she was instructed her water was going to b resticted. A Stsff to cont to restrict water to 500 cc per 24 hrs. R Safety is amitnaiend and POC includes water restiction PD RN [facility]

## 2011-08-03 NOTE — Progress Notes (Signed)
[facility] MD Progress Note  08/03/2011 1:24 PM  Diagnosis:  Axis I: Schizoaffective Disorder - Bipolar Type.   The patient was seen today and reports the following:   ADL's: Intact.  Sleep: The patient reports to continuing to sleep well at night.  Appetite: The patient continues to report a good appetite.   Mild>(1-10) >Severe  Hopelessness (1-10): 0  Depression (1-10): 0  Anxiety (1-10): 0  Pain (1-10): 1-2   Suicidal Ideation: The patient adamantly denies any suicidal ideations today.  Plan: No  Intent: No  Means: No   Homicidal Ideation: The patient adamantly denies any homicidal ideations today.  Plan: No  Intent: No.  Means: No   General Appearance /Behavior: Casual and cooperative.  Eye Contact: Good.  Speech: Appropriate in rate and volume.  Motor Behavior: Appropriate.  Level of Consciousness: Alert and Oriented x 3.  Mental Status: Alert and Oriented x 3.  Mood: Essentially Euthymic.  Affect: Slightly Constricted.  Anxiety Level: None noted or reported today.  Thought Process: WNL.  Thought Content: The patient denies any auditory or visual hallucinations or delusional thinking today.  Perception:. wnl.  Judgment: Fair to Good.  Insight: Fair to Good.  Cognition: Orientated to time, place and person.  Sleep:  Number of Hours: 5    Vital Signs:Blood pressure 143/89, pulse 67, temperature 97.6 F (36.4 C), temperature source Oral, resp. rate 18, height 5\' 2"  (1.575 m), weight 88.905 kg (196 lb).  Current Medications: Current Facility-Administered Medications  Medication Dose Route Frequency Provider Last Rate Last Dose  . acetaminophen (TYLENOL) tablet 650 mg  650 mg Oral Q6H PRN Franchot Gallo, MD      . albuterol-ipratropium (COMBIVENT) inhaler 2 puff  2 puff Inhalation QID PRN Franchot Gallo, MD      . aspirin chewable tablet 81 mg  81 mg Oral Daily Franchot Gallo, MD   81 mg at 08/03/11 0829  . calcium-vitamin D (OSCAL WITH D) 500-200 MG-UNIT per tablet 1  tablet  1 tablet Oral [id number] Franchot Gallo, MD   1 tablet at 08/03/11 0829  . cholecalciferol (VITAMIN D) tablet 1,000 Units  1,000 Units Oral Daily Franchot Gallo, MD   1,000 Units at 08/03/11 0829  . clonazePAM (KLONOPIN) tablet 0.5 mg  0.5 mg Oral BH-qamhs Ariyannah Pauling, MD   0.5 mg at 08/03/11 0829  . diltiazem (CARDIZEM CD) 24 hr capsule 240 mg  240 mg Oral Daily Franchot Gallo, MD   240 mg at 08/03/11 0829  . divalproex (DEPAKOTE ER) 24 hr tablet 1,000 mg  1,000 mg Oral QHS Franchot Gallo, MD   1,000 mg at 08/02/11 2130  . divalproex (DEPAKOTE ER) 24 hr tablet 500 mg  500 mg Oral Q breakfast Franchot Gallo, MD   500 mg at 08/03/11 0830  . docusate sodium (COLACE) capsule 100 mg  100 mg Oral BH-qamhs Harutyun Monteverde, MD   100 mg at 08/03/11 0830  . DULoxetine (CYMBALTA) DR capsule 60 mg  60 mg Oral Daily Franchot Gallo, MD   60 mg at 08/03/11 0830  . DULoxetine (CYMBALTA) DR capsule 60 mg  60 mg Oral Q1400 Franchot Gallo, MD   60 mg at 08/02/11 1422  . Fluticasone-Salmeterol (ADVAIR) 250-50 MCG/DOSE inhaler 1 puff  1 puff Inhalation Q12H Franchot Gallo, MD   1 puff at 08/03/11 0830  . HYDROcodone-acetaminophen (NORCO) 5-325 MG per tablet 1 tablet  1 tablet Oral BID PRN Franchot Gallo, MD   1 tablet at 08/03/11 0835  . insulin  aspart (novoLOG) injection 0-15 Units  0-15 Units Subcutaneous TID WC Viviann Spare, NP      . magnesium hydroxide (MILK OF MAGNESIA) suspension 30 mL  30 mL Oral Daily PRN Franchot Gallo, MD      . nicotine (NICODERM CQ - dosed in mg/24 hours) patch 21 mg  21 mg Transdermal Daily Franchot Gallo, MD   21 mg at 08/03/11 0650  . pantoprazole (PROTONIX) EC tablet 40 mg  40 mg Oral QHS Franchot Gallo, MD   40 mg at 08/02/11 2131  . perphenazine (TRILAFON) tablet 8 mg  8 mg Oral BH-qamhs Jaelen Soth, MD   8 mg at 08/03/11 0830  . senna-docusate (Senokot-S) tablet 1 tablet  1 tablet Oral Daily Franchot Gallo, MD   1 tablet at 08/03/11 0829  . simvastatin (ZOCOR)  tablet 20 mg  20 mg Oral QHS Franchot Gallo, MD   20 mg at 08/02/11 2132  . sodium chloride tablet 2 g  2 g Oral TID WC Franchot Gallo, MD   2 g at 08/03/11 1234   Lab Results:  Results for orders placed during the hospital encounter of 07/24/11 (from the past 48 hour(s))  GLUCOSE, CAPILLARY     Status: Abnormal   Collection Time   08/01/11  4:33 PM      Component Value Range Comment   Glucose-Capillary 137 (*) 70 - 99 (mg/dL)   COMPREHENSIVE METABOLIC PANEL     Status: Abnormal   Collection Time   08/01/11  8:06 PM      Component Value Range Comment   Sodium 126 (*) 135 - 145 (mEq/L)    Potassium 4.0  3.5 - 5.1 (mEq/L)    Chloride 90 (*) 96 - 112 (mEq/L)    CO2 28  19 - 32 (mEq/L)    Glucose, Bld 111 (*) 70 - 99 (mg/dL)    BUN 6  6 - 23 (mg/dL)    Creatinine, Ser 1.61 (*) 0.50 - 1.10 (mg/dL)    Calcium 8.9  8.4 - 10.5 (mg/dL)    Total Protein 6.5  6.0 - 8.3 (g/dL)    Albumin 3.0 (*) 3.5 - 5.2 (g/dL)    AST 10  0 - 37 (U/L)    ALT 10  0 - 35 (U/L)    Alkaline Phosphatase 65  39 - 117 (U/L)    Total Bilirubin 0.1 (*) 0.3 - 1.2 (mg/dL)    GFR calc non Af Amer >90  >90 (mL/min)    GFR calc Af Amer >90  >90 (mL/min)   GLUCOSE, CAPILLARY     Status: Abnormal   Collection Time   08/02/11  6:40 AM      Component Value Range Comment   Glucose-Capillary 190 (*) 70 - 99 (mg/dL)   GLUCOSE, CAPILLARY     Status: Abnormal   Collection Time   08/02/11 11:53 AM      Component Value Range Comment   Glucose-Capillary 118 (*) 70 - 99 (mg/dL)   GLUCOSE, CAPILLARY     Status: Abnormal   Collection Time   08/02/11  4:46 PM      Component Value Range Comment   Glucose-Capillary 104 (*) 70 - 99 (mg/dL)   COMPREHENSIVE METABOLIC PANEL     Status: Abnormal   Collection Time   08/02/11  8:03 PM      Component Value Range Comment   Sodium 124 (*) 135 - 145 (mEq/L)    Potassium 4.3  3.5 - 5.1 (mEq/L)  Chloride 89 (*) 96 - 112 (mEq/L)    CO2 28  19 - 32 (mEq/L)    Glucose, Bld 107 (*) 70 - 99 (mg/dL)      BUN 6  6 - 23 (mg/dL)    Creatinine, Ser 4.09 (*) 0.50 - 1.10 (mg/dL)    Calcium 8.9  8.4 - 10.5 (mg/dL)    Total Protein 6.4  6.0 - 8.3 (g/dL)    Albumin 3.1 (*) 3.5 - 5.2 (g/dL)    AST 11  0 - 37 (U/L)    ALT 10  0 - 35 (U/L)    Alkaline Phosphatase 64  39 - 117 (U/L)    Total Bilirubin 0.2 (*) 0.3 - 1.2 (mg/dL)    GFR calc non Af Amer >90  >90 (mL/min)    GFR calc Af Amer >90  >90 (mL/min)   GLUCOSE, CAPILLARY     Status: Normal   Collection Time   08/03/11  6:00 AM      Component Value Range Comment   Glucose-Capillary 89  70 - 99 (mg/dL)    Comment 1 Notify RN     GLUCOSE, CAPILLARY     Status: Abnormal   Collection Time   08/03/11 11:57 AM      Component Value Range Comment   Glucose-Capillary 110 (*) 70 - 99 (mg/dL)    IMPRESSION:  Progression of L1 compression fracture when compared with  02/16/2011. Gradient 50% loss vertebral body height. Previous  sacroplasty. Stable findings L3.  Per CMS PQRS reporting requirements (PQRS Measure 24): Given the  patient's age of greater than 50 and the fracture site (hip, distal  radius, or spine), the patient should be tested for osteoporosis  using DXA, and the appropriate treatment considered based on the  DXA results.  Original Report Authenticated By: Elsie Stain, M.D.    IMPRESSION:  Multiple compression fractures as described, including T3, T5, T9,  and T11. These are likely chronic except for T11 which may be  indeterminate. If there are no contraindications, MRI could be  helpful in further evaluation to assess for bone marrow edema.  Per CMS PQRS reporting requirements (PQRS Measure 24): Given the  patient's age of greater than 50 and the fracture site (hip, distal  radius, or spine), the patient should be tested for osteoporosis  using DXA, and the appropriate treatment considered based on the  DXA results.    Time was spent today discussing with the patient her desire for discharge once placement is found.  The  patient is scheduled to interview an Assisted Living Facility this afternoon.  The patient was again reminded of her need to restrict water.  Treatment Plan Summary:  1. Daily contact with patient to assess and evaluate symptoms and progress in treatment  2. Medication management  3. The patient will deny suicidal ideations or homicidal ideations for 48 hours prior to discharge and have a depression and anxiety rating of 3 or less. The patient will also deny any auditory or visual hallucinations or delusional thinking.   Plan:  1. Will continue current medications.  2. Will continue to monitor.  3. Case Manager is working on placement issue.  4. Will continue to monitor hyponatremia.  5. Repeat CMP today.  Zhane Donlan 08/03/2011, 1:24 PM

## 2011-08-03 NOTE — Progress Notes (Signed)
Patient resting quietly with eyes closed. Respirations even and unlabored. No distress noted. q 15 minute checks continues as ordered to maintain safety. 

## 2011-08-03 NOTE — Discharge Planning (Signed)
Per State Regulation 482.30  This chart was reviewed for medical necessity with respect to the patient's Admission/Duration of stay.   Next review due:  08/06/11   Ambrose Mantle, LCSW  08/03/2011  12:22 PM

## 2011-08-04 LAB — GLUCOSE, CAPILLARY
Glucose-Capillary: 95 mg/dL (ref 70–99)
Glucose-Capillary: 118 mg/dL — ABNORMAL HIGH (ref 70–99)
Glucose-Capillary: 130 mg/dL — ABNORMAL HIGH (ref 70–99)

## 2011-08-04 LAB — BASIC METABOLIC PANEL
BUN: 8 mg/dL (ref 6–23)
Chloride: 91 mEq/L — ABNORMAL LOW (ref 96–112)
Creatinine, Ser: 0.44 mg/dL — ABNORMAL LOW (ref 0.50–1.10)
Glucose, Bld: 96 mg/dL (ref 70–99)
Potassium: 4 mEq/L (ref 3.5–5.1)

## 2011-08-04 LAB — URINALYSIS, ROUTINE W REFLEX MICROSCOPIC
Bilirubin Urine: NEGATIVE
Hgb urine dipstick: NEGATIVE
Ketones, ur: NEGATIVE mg/dL
Specific Gravity, Urine: 1.007 (ref 1.005–1.030)
Urobilinogen, UA: 0.2 mg/dL (ref 0.0–1.0)
pH: 7 (ref 5.0–8.0)

## 2011-08-04 MED ORDER — FUROSEMIDE 20 MG PO TABS
20.0000 mg | ORAL_TABLET | Freq: Once | ORAL | Status: AC
  Administered 2011-08-04: 20 mg via ORAL
  Filled 2011-08-04: qty 1

## 2011-08-04 NOTE — Progress Notes (Signed)
Patient ID: Natalie Thomas, female   DOB: Jul 19, 1960, 51 y.o.   MRN: [id number]   [facility] Group Notes:  (Counselor/Nursing/MHT/Case Management/Adjunct)  08/04/2011 11 AM  Type of Therapy:  Group Therapy, Dance/Movement Therapy   Participation Level:  Active  Participation Quality:  Appropriate  Affect:  Appropriate  Cognitive:  Oriented  Insight:  Good  Engagement in Group:  Good  Engagement in Therapy:  Good  Modes of Intervention:  Clarification, Problem-solving, Role-play, Socialization and Support  Summary of Progress/Problems:  Group expressed feelings through movement as a means to release negative thoughts and feelings. Through group interaction, this allowed them to progress towards a more positive feeling state. Pt shared that she was "heartbroken," but after the experiential, she was feeling like the "sun" by drawing these images with finger in space. This shows that she is resilient and can positively cope in her recovery process.   [name], [employer]

## 2011-08-04 NOTE — Progress Notes (Signed)
Patient ID: Natalie Thomas, female   DOB: 11-Feb-1961, 51 y.o.   MRN: [id number]  Pt is planning on being D/C on Monday, depending on the improvement of her sodium levels. She is going to a group home, [name], and says that someone from the home will be picking her up. Pt needs 2 notes verifying her dates of admission in the [facility] system - one for the [employer] [facility] Care center and for Medicaid. Pt has two follow-up appointments set on August 16, 2011.  Pt. attended and participated in aftercare planning group. Pt. accepted information on suicide prevention, warning signs to look for with suicide and crisis line numbers to use. The pt. agreed to call crisis line numbers if having warning signs or having thoughts of suicide.

## 2011-08-04 NOTE — Consult Note (Signed)
Medical Consultation  Demaria Deeney ZOX:[id number] DOB: 09-16-1960 DOA: 07/24/2011  Referring physician: PCP: Romero Belling, MD, MD   Date of consultation: 08/04/2011 Reason for consultation: hyponatremia   HPI:  51 year old with h/o schizoaffective disorder ,copd, gerd, DM not on any medications, came in after being evicted from group home. We were requested to consult for hyponatremia. Pt was consulted for hyponatremia initially and signed off, and we recommended fluid restriction to 1 liter as she has Psychogenic polydipsia. She is non compliant to the fluid restriction and very frustrated that she cannot drink water. Currently she is asymptomatic from the hyponatremia of 128.   Review of Systems:   Patient denies fatigue, diminished appetite, weight loss, fever, chills, headache, blurred vision, difficulty in speaking, dysphagia, chest pain, cough, shortness of breath, orthopnea, paroxysmal nocturnal dyspnea, nausea, diaphoresis, abdominal pain, vomiting, diarrhea, belching, heartburn, hematemesis, melena, dysuria, nocturia, urinary frequency, hematochezia, lower extremity swelling, pain, or redness. Patient admits to ingestion of large amounts of water, ie, over 20 cups of water per day at home. Apparently, has had problems with low sodium levels, years ago, and as a matter of fact, had to be on "Sodium pills" up to a year ago. The rest of the systems review is negative.  Past Medical History  Diagnosis Date  . COPD (chronic obstructive pulmonary disease)   . Depression   . GERD (gastroesophageal reflux disease)   . Hypertension   . Osteoporosis   . Diabetes mellitus, type 2     pt denies but states that she has been treated for DM   Past Surgical History  Procedure Date  . Pelvic fracture surgery   . Eye surgery     on left  eye, pt states that she sees bad out of the right eye and needs surgery there   Social History:  reports that she has been smoking Cigarettes.  She has  been smoking about 1 pack per day. She does not have any smokeless tobacco history on file. She reports that she does not drink alcohol. Her drug history not on file.  Allergies  Allergen Reactions  . Biaxin Itching  . Penicillins Itching   History reviewed. No pertinent family history.  Prior to Admission medications   Medication Sig Start Date End Date Taking? Authorizing Provider  albuterol-ipratropium (COMBIVENT) 18-103 MCG/ACT inhaler Inhale 2 puffs into the lungs 4 (four) times daily as needed. For shortness of breath. 07/12/11  Yes Viviann Spare, NP  HYDROcodone-acetaminophen (NORCO) 5-325 MG per tablet Take 1 tablet by mouth 2 (two) times daily as needed. For chronic back pain   Yes Historical Provider, MD  sennosides-docusate sodium (SENOKOT-S) 8.6-50 MG tablet Take 1 tablet by mouth daily.   Yes Historical Provider, MD  aspirin 81 MG tablet Take 1 tablet (81 mg total) by mouth daily. For heart health 07/12/11   Viviann Spare, NP  Calcium Carbonate-Vitamin D (CALTRATE 600+D) 600-400 MG-UNIT per tablet Take 1 tablet by mouth 2 (two) times daily. For osteoparosis. 07/12/11   Viviann Spare, NP  cholecalciferol (VITAMIN D) 1000 UNITS tablet Take 1 tablet (1,000 Units total) by mouth daily. For osteoparosis. 07/12/11   Viviann Spare, NP  clonazePAM (KLONOPIN) 0.5 MG tablet Take 0.5 mg by mouth 2 (two) times daily.    Historical Provider, MD  clonazePAM (KLONOPIN) 1 MG tablet Take 1 mg by mouth daily. For anxiety    Historical Provider, MD  diltiazem (DILACOR XR) 240 MG 24 hr capsule Take  1 capsule (240 mg total) by mouth daily. For blood pressure and stable heart rhythm 07/12/11   Viviann Spare, NP  divalproex (DEPAKOTE ER) 500 MG 24 hr tablet Take 1 tablet (500 mg total) by mouth 2 (two) times daily. For mood stability 07/12/11 07/11/12  Viviann Spare, NP  docusate sodium (COLACE) 100 MG capsule Take 100 mg by mouth 2 (two) times daily.    Historical Provider, MD  DULoxetine  (CYMBALTA) 30 MG capsule Take 2 capsules (60mg ) by mouth every morning and 1 capsule (30mg ) daily at 2 PM for depression/pain 07/13/11   Franchot Gallo, MD  Fluticasone-Salmeterol (ADVAIR DISKUS) 250-50 MCG/DOSE AEPB Inhale 1 puff into the lungs every 12 (twelve) hours. For copd 07/12/11   Viviann Spare, NP  omeprazole (PRILOSEC) 20 MG capsule Take 1 capsule (20 mg total) by mouth daily. For acid reflux. 07/12/11   Viviann Spare, NP  perphenazine (TRILAFON) 8 MG tablet Take 1 tablet (8 mg total) by mouth 2 (two) times daily in the am and at bedtime.. For clear thoughts. 07/12/11   Viviann Spare, NP  pravastatin (PRAVACHOL) 40 MG tablet Take 1 tablet (40 mg total) by mouth at bedtime. For cholesterol. 07/12/11   Viviann Spare, NP   Physical Exam: Blood pressure 135/78, pulse 88, temperature 97.6 F (36.4 C), temperature source Oral, resp. rate 16, height 5\' 2"  (1.575 m), weight 88.905 kg (196 lb). Filed Vitals:   08/03/11 0608 08/03/11 0611 08/04/11 0910 08/04/11 0911  BP: 177/84 143/89 166/75 135/78  Pulse: 62 67 70 88  Temp: 97.6 F (36.4 C)  97.6 F (36.4 C)   TempSrc: Oral  Oral   Resp: 18  16   Height:      Weight:       Constitutional: Vital signs reviewed.  Patient is a well-developed and well-nourished  in no acute distress and cooperative with exam. Alert and oriented x3.  Head: Normocephalic and atraumatic Ear: TM normal bilaterally Mouth: no erythema or exudates, MMM Eyes: PERRL, EOMI, conjunctivae normal, No scleral icterus.  Neck: Supple, Trachea midline normal ROM, No JVD, mass, thyromegaly, or carotid bruit present.  Cardiovascular: RRR, S1 normal, S2 normal, no MRG, pulses symmetric and intact bilaterally Pulmonary/Chest: CTAB, no wheezes, rales, or rhonchi Abdominal: Soft. Non-tender, non-distended, bowel sounds are normal, no masses, organomegaly, or guarding present.  GU: no CVA tenderness Musculoskeletal: No joint deformities, erythema, or stiffness, ROM full  and no nontender Hematology: no cervical, inginal, or axillary adenopathy.  Neurological: A&O x3, Strenght is normal and symmetric bilaterally, cranial nerve II-XII are grossly intact, no focal motor deficit, sensory intact to light touch bilaterally.  Skin: Warm, dry and intact. No rash, cyanosis, or clubbing.     Labs on Admission:  Basic Metabolic Panel:  Lab 08/04/11 9604 08/02/11 2003 08/01/11 2006 07/31/11 0621 07/30/11 1942  NA 128* 124* 126* 128* 126*  K 4.0 4.3 -- -- --  CL 91* 89* 90* 91* 89*  CO2 29 28 28 28 28   GLUCOSE 96 107* 111* 80 111*  BUN 8 6 6  5* 5*  CREATININE 0.44* 0.43* 0.42* 0.36* 0.37*  CALCIUM 9.1 8.9 8.9 9.5 9.0  MG -- -- -- -- --  PHOS -- -- -- -- --   Liver Function Tests:  Lab 08/02/11 2003 08/01/11 2006 07/30/11 1942  AST 11 10 11   ALT 10 10 11   ALKPHOS 64 65 69  BILITOT 0.2* 0.1* 0.1*  PROT 6.4 6.5 6.7  ALBUMIN  3.1* 3.0* 3.2*   No results found for this basename: LIPASE:5,AMYLASE:5 in the last 168 hours No results found for this basename: AMMONIA:5 in the last 168 hours CBC: No results found for this basename: WBC:5,NEUTROABS:5,HGB:5,HCT:5,MCV:5,PLT:5 in the last 168 hours Cardiac Enzymes: No results found for this basename: CKTOTAL:5,CKMB:5,CKMBINDEX:5,TROPONINI:5 in the last 168 hours BNP: No components found with this basename: POCBNP:5 CBG:  Lab 08/04/11 1135 08/04/11 0613 08/03/11 2137 08/03/11 1647 08/03/11 1157  GLUCAP 118* 95 86 154* 110*    Radiological Exams on Admission: No results found.    Impression/Recommendations 1. Hyponatremia: looking at the previous lab values and history, it is evident that patient has psychogenic polydipsia.  2. Plan is to do fluid restriction to 1 liter per day. Continue with salt tablets. 3. Repeat urine osmolarity and serum sodium and serum osmolarity in am.   I will followup again in the morning. Please contact me if I can be of assistance in the meanwhile. Thank you for this  consultation.  Dreama Saa [phone]

## 2011-08-04 NOTE — Progress Notes (Signed)
Pt has been irritable and angry today   She is upset over not being able to drink what she wants to drink  And is asking for fluids every 30 minutes or more  She is putting salt in her gator aide that she is drinking  Pt has been repeatedly instructed on her consumption of fluids and dangers of her low sodium but she said she would be able to quit drinking so much if she could smoke   Verbal support given  Medications administered and effectiveness monitored   Q 15 min checks  Pt safe at present

## 2011-08-04 NOTE — Progress Notes (Signed)
Patient ID: Natalie Thomas, female   DOB: [birth year], 51 y.o.   MRN: [id number] The patient was made aware of her fluid restriction. She was agitated and irritable and did not want to listen to ways we could help her. Instead attempted several times to get a drink of water from the water fountain. After she was redirected several times, tried to get other patients to get water for her. Was offered and consumed several glasses of gatorade.

## 2011-08-04 NOTE — Progress Notes (Signed)
Patient ID: Natalie Thomas, female   DOB: 07/01/1960, 51 y.o.   MRN: [id number] [name] is fully alert and in full contact with reality and upset by various attempts on our part to counsel her and restrict her fluids.  In spite of drinking Gatorade and taking sodium tablets, her sodium most recently on 2/7 was 124.  This has prevented her from going to her planned SNF placement yesterday.  She reports voiding frequently and voided in the bed last night without realizing it.   She reports her depression is a 2/10, no suicidal thoughts, anxiety a 3/10, and no hallucinations.  She has been without suicidal thoughts for the past 4 days and is pending skilled nursing placement for rehab after PT evaluation and recommendations.   P: Consult pharmacy who is questioning the possibility of Diabetes Insipidus or other medical complication.  I will stop her Cymbalta for now until we get additional recommendations.   Will ask Internal Medicine to have another look at her.

## 2011-08-05 DIAGNOSIS — R45851 Suicidal ideations: Secondary | ICD-10-CM

## 2011-08-05 LAB — BASIC METABOLIC PANEL
CO2: 28 mEq/L (ref 19–32)
Chloride: 96 mEq/L (ref 96–112)
Glucose, Bld: 131 mg/dL — ABNORMAL HIGH (ref 70–99)
Sodium: 132 mEq/L — ABNORMAL LOW (ref 135–145)

## 2011-08-05 LAB — OSMOLALITY, URINE: Osmolality, Ur: 203 mOsm/kg — ABNORMAL LOW (ref 390–1090)

## 2011-08-05 LAB — GLUCOSE, CAPILLARY
Glucose-Capillary: 103 mg/dL — ABNORMAL HIGH (ref 70–99)
Glucose-Capillary: 127 mg/dL — ABNORMAL HIGH (ref 70–99)

## 2011-08-05 MED ORDER — FUROSEMIDE 20 MG PO TABS
20.0000 mg | ORAL_TABLET | Freq: Every day | ORAL | Status: DC
  Administered 2011-08-05 – 2011-08-06 (×2): 20 mg via ORAL
  Filled 2011-08-05 (×2): qty 1
  Filled 2011-08-05: qty 2

## 2011-08-05 MED ORDER — DULOXETINE HCL 60 MG PO CPEP
60.0000 mg | ORAL_CAPSULE | Freq: Two times a day (BID) | ORAL | Status: DC
  Administered 2011-08-05 – 2011-08-06 (×3): 60 mg via ORAL
  Filled 2011-08-05 (×2): qty 1
  Filled 2011-08-05 (×2): qty 7
  Filled 2011-08-05 (×2): qty 1

## 2011-08-05 NOTE — Progress Notes (Signed)
Pt has been quite pleasant this evening. Her requests are appropriate and respectful. Did ask once for Crystal Lite but was reminded of fluid restriction and received that reminder without incident. Gatorade was provided alternatively. She did complain of back pain of a 9/10 and received her Norco prn. Her CBG is 127 tonight and she does not has coverage ordered at hs. Pt given support and encouragement. Room cleaned due to strong smell of urine. Pt reminded to toilet frequently and that staff will assist as needed. She is wheelchair exclusively tonight. On R/A of pain it is only reduced to a 7/10 though pt does state that she feels the improvement. She denies SI/HI/AVH and remains safe. Is hoping for discharge on Monday. [name]

## 2011-08-05 NOTE — Progress Notes (Signed)
Patient ID: Natalie Thomas, female   DOB: December 14, 1960, 51 y.o.   MRN: [id number]  [facility] Group Notes:  (Counselor/Nursing/MHT/Case Management/Adjunct)  08/05/2011 11 AM  Type of Therapy:  Group Therapy, Dance/Movement Therapy   Participation Level:  Active  Participation Quality:  Appropriate  Affect:  Appropriate  Cognitive:  Appropriate  Insight:  Good  Engagement in Group:  Good  Engagement in Therapy:  Good  Modes of Intervention:  Clarification, Problem-solving, Role-play, Socialization and Support  Summary of Progress/Problems: Therapist discussed ways healthy supports can help aid in the recovery process and to name one healthy support that aids them individually.  Pt. stated that "sunshine and relaxatio" helps to aid her recovery  Pt. stated that " slow breathing" is a healthy method of support to help aid in her specific recovery today.   Rhunette Croft

## 2011-08-05 NOTE — Progress Notes (Signed)
Patient ID: Natalie Thomas, female   DOB: 11/22/1960, 51 y.o.   MRN: [id number] The patient is in better spirits this evening. Was eager to share the plan that was devised by the day shift nurse regarding her fluid intake. Proudly announced she was still allowed to have one more cup of fluids for the day and she planned to have a cup of water with her snack. She adhered to the plan through out the shift. Responded positively to the praise given to her for her compliance.

## 2011-08-05 NOTE — Progress Notes (Signed)
Pt was initially irritable demanding and argumentative this morning regarding her fluid restrictions  She was saying staff was lying about her consumption   She talked with this Clinical research associate and did finally calm down  She wants to be discharged tomorrow so she agreed to continue maintaining at her required restrictions on fluids   Pt attended groups and participated appropriately   Verbal support given  Praise efforts regarding fluid restriction  Medications administered and effectiveness monitored   Pt safe at present

## 2011-08-05 NOTE — Progress Notes (Signed)
51 year old lady with h/o schizoaffective disorder, copd and DM, got admitted to [facility], after being evicted fromt he group home for threatening  Her supervisor. Initially Medicine consult was obtained for hyponatremia. She was found to heave psychogenic polydipsia which was evident by low sodium level and low urine osmolality. On fluid restriction her sodium improved to 132 from 124.   PLan: continue fluid restriction 1 to 1.2 liters, with gatorade as needed. Continue with sodium tablets.  Will sign off. Please call if needed.   Hensley Treat 319 [phone].

## 2011-08-05 NOTE — Progress Notes (Addendum)
Patient ID: Natalie Thomas, female   DOB: 01-Aug-1960, 51 y.o.   MRN: [id number] Irritable and anxious with staff this morning, verbally abusive with roommate. Having a lot of difficulty coping with the idea of fluid restrictions.  Subjectively she reports feeling irritable and sad - wants to get out of here and go on to assisted living.  Denies any plans or intents to harm herself.  She is encouraged by the news today that her sodium is 132 - and she received 20mg  of Lasix yesterday.    Cooperative with me today.  Wants to leave.  CM working on placement.   See IM consult note.    A:  Psychogenic polydipsia.  Schizoaffective Disorder.  R/O Personality Disorder NOS with borderline features.    P: Will restart Cymbalta now that we are clear that this is not diabetes insipidus.  Will continue Lasix 20mg  daily.  And decrease sodium tabs.   Has been disruptive on the unit, uncooperative with staff, and verbally abusive to roommate.  Will make do-not-admit status and place in private room.

## 2011-08-06 LAB — GLUCOSE, CAPILLARY
Glucose-Capillary: 86 mg/dL (ref 70–99)
Glucose-Capillary: 103 mg/dL — ABNORMAL HIGH (ref 70–99)

## 2011-08-06 MED ORDER — DOCUSATE SODIUM 100 MG PO CAPS: 100.0000 mg | ORAL_CAPSULE | Freq: Two times a day (BID) | ORAL | Status: DC

## 2011-08-06 MED ORDER — VITAMIN D 1000 UNITS PO TABS: 1000.0000 [IU] | ORAL_TABLET | Freq: Every day | ORAL | Status: DC

## 2011-08-06 MED ORDER — HYDROCODONE-ACETAMINOPHEN 5-325 MG PO TABS: 1.0000 | ORAL_TABLET | Freq: Two times a day (BID) | ORAL | Status: DC | PRN

## 2011-08-06 MED ORDER — IPRATROPIUM-ALBUTEROL 18-103 MCG/ACT IN AERO: 2.0000 | INHALATION_SPRAY | Freq: Four times a day (QID) | RESPIRATORY_TRACT | Status: DC | PRN

## 2011-08-06 MED ORDER — PRAVASTATIN SODIUM 40 MG PO TABS: 40.0000 mg | ORAL_TABLET | Freq: Every day | ORAL | Status: DC

## 2011-08-06 MED ORDER — OMEPRAZOLE 20 MG PO CPDR: 20.0000 mg | DELAYED_RELEASE_CAPSULE | Freq: Every day | ORAL | Status: DC

## 2011-08-06 MED ORDER — FUROSEMIDE 20 MG PO TABS: 20.0000 mg | ORAL_TABLET | Freq: Every day | ORAL | Status: DC

## 2011-08-06 MED ORDER — CALCIUM CARBONATE-VITAMIN D 500-200 MG-UNIT PO TABS: 1.0000 | ORAL_TABLET | ORAL | Status: DC

## 2011-08-06 MED ORDER — PERPHENAZINE 8 MG PO TABS: 8.0000 mg | ORAL_TABLET | ORAL | Status: DC

## 2011-08-06 MED ORDER — DIVALPROEX SODIUM ER 500 MG PO TB24: ORAL_TABLET | ORAL | Status: DC

## 2011-08-06 MED ORDER — FLUTICASONE-SALMETEROL 250-50 MCG/DOSE IN AEPB: 1.0000 | INHALATION_SPRAY | Freq: Two times a day (BID) | RESPIRATORY_TRACT | Status: DC

## 2011-08-06 MED ORDER — SODIUM CHLORIDE 1 G PO TABS: 2.0000 g | ORAL_TABLET | Freq: Three times a day (TID) | ORAL | Status: AC

## 2011-08-06 MED ORDER — CLONAZEPAM 0.5 MG PO TABS: ORAL_TABLET | ORAL | Status: DC

## 2011-08-06 MED ORDER — DULOXETINE HCL 60 MG PO CPEP: 60.0000 mg | ORAL_CAPSULE | Freq: Two times a day (BID) | ORAL | Status: DC

## 2011-08-06 MED ORDER — ASPIRIN 81 MG PO TABS: 81.0000 mg | ORAL_TABLET | Freq: Every day | ORAL | Status: DC

## 2011-08-06 MED ORDER — DILTIAZEM HCL ER 240 MG PO CP24: 240.0000 mg | ORAL_CAPSULE | Freq: Every day | ORAL | Status: DC

## 2011-08-06 MED ORDER — NICOTINE 21 MG/24HR TD PT24
21.0000 mg | MEDICATED_PATCH | Freq: Every day | TRANSDERMAL | Status: DC
Start: 2011-08-06 — End: 2011-08-06
  Administered 2011-08-06: 21 mg via TRANSDERMAL
  Filled 2011-08-06 (×2): qty 1

## 2011-08-06 MED ORDER — SENNOSIDES-DOCUSATE SODIUM 8.6-50 MG PO TABS: 1.0000 | ORAL_TABLET | Freq: Every day | ORAL | Status: DC

## 2011-08-06 MED ORDER — SODIUM CHLORIDE 1 G PO TABS: 2.0000 g | ORAL_TABLET | Freq: Three times a day (TID) | ORAL | Status: DC

## 2011-08-06 NOTE — Progress Notes (Signed)
[facility] Case Management Discharge Plan:  Will you be returning to the same living situation after discharge: No.  Is going to a new group home At discharge, do you have transportation home?:Yes,  Group home will pick up Do you have the ability to pay for your medications:Yes,  insurance and disability income  Interagency Information:     Release of information consent forms completed and in the chart;  Patient's signature needed at discharge.  Patient to Follow up at:  Follow-up Information    Follow up with Romero Belling, MD on 08/14/2011. (@1 :15pm, contact 24 hours in advance if you cannot keep this appt, to avoid  penalty charges.)    Contact information:   [address] [facility] [address] [place] [address] [phone]       Follow up with [facility] on 08/16/2011. (3:15 pm for medication management)    Contact information:   [address] [place]  [address] Telephone:  [phone]         Patient denies SI/HI:   Yes,      Safety Planning and Suicide Prevention discussed:  Yes,    Barrier to discharge identified:No.  Summary and Recommendations:  Patient will be able to return to her psychosocial rehab program if the group home can get her there or if the Medicaid transportation will take her.  She can continue medication management at [facility] until her Medicaid gets transferred to new county, and family care home will arrange new follow-up.   Sarina Ser 08/06/2011, 1:50 PM

## 2011-08-06 NOTE — Progress Notes (Signed)
Pt continues to complain loudly about fluid restrictions stating she's only had 2 cups of coffee however peers notify staff that this is untrue. Coffee and juice had to be locked away from pt.  Pt belligerent with staff. Redirected with minimal success. Pt did drink several pitchers of gatorade throughout the night. [name]

## 2011-08-06 NOTE — Tx Team (Signed)
Interdisciplinary Treatment Plan Update (Adult)  Date:  08/06/2011  Time Reviewed:  10:15AM-11:00AM  Progress in Treatment: Attending groups:  Yes Participating in groups:    Yes Taking medication as prescribed:    Yes Tolerating medication:   Yes Family/Significant other contact made:  Yes Patient understands diagnosis:   Yes Discussing patient identified problems/goals with staff:   Yes Medical problems stabilized or resolved:   Yes, sodium under control Denies suicidal/homicidal ideation:  Yes Issues/concerns per patient self-inventory:   Yes Other:  New problem(s) identified: No, Describe:    Reason for Continuation of Hospitalization: None  Interventions implemented related to continuation of hospitalization:  Medication monitoring and adjustment, safety checks Q15 min., suicide risk assessment, group therapy, psychoeducation, collateral contact, aftercare planning, ongoing physician assessments, medication education - until discharge  Additional comments:  Will remain on fluid restriction  Estimated length of stay:  Discharge today  Discharge Plan:  Go to [facility] group home, follow up with [name], follow up with her guardian Natalie Thomas  New goal(s):  Not applicable  Review of initial/current patient goals per problem list:   1.  Goal(s):  Find placement where patient can go at discharge  Met:  Yes  Target date:  By Discharge   As evidenced by:  Will go to a group home in [place]  2.  Goal(s):  Develop 3 coping skills for her anger  Met:  Yes  Target date:  By Discharge   As evidenced by:  Attendees: Patient:  Natalie Thomas  08/06/2011 10:30AM  Family:     Physician:  Dr. Harvie Heck Readling 08/06/2011 10:30AM  Nursing:   Robbie Louis, RN 08/06/2011 10:30AM    Case Manager:  Ambrose Mantle, LCSW 08/06/2011 10:30AM  Counselor:  Veto Kemps, MT-BC 08/06/2011 10:30AM  Other:   Lynann Bologna, NP 08/06/2011 10:30AM    Other:   Tacy Learn, RN 08/06/2011 10:30AM  Other:      Other:       Scribe for Treatment Team:   Sarina Ser, 08/06/2011, 11:10 AM

## 2011-08-06 NOTE — [facility] Suicide Risk Assessment (Signed)
Suicide Risk Assessment  Discharge Assessment     Demographic factors:  Assessment Details Time of Assessment: Admission Information Obtained From: Patient Current Mental Status:  Current Mental Status:  (denies si at this time) Risk Reduction Factors:  Risk Reduction Factors: Positive social support  CLINICAL FACTORS:   Unstable or Poor Therapeutic Relationship Previous Psychiatric Diagnoses and Treatments Medical Diagnoses and Treatments/Surgeries Schizoaffective Disorder - Bipolar Type.  COGNITIVE FEATURES THAT CONTRIBUTE TO RISK:  None Noted.   Diagnosis:  Axis I: Schizoaffective Disorder - Bipolar Type.   The patient was seen today and reports the following:   ADL's: Intact.  Sleep: The patient reports to sleeping well without difficulty.  Appetite: The patient reports a good appetite.   Mild (1-10) >Severe  Hopelessness (1-10): 0  Depression (1-10): 0  Anxiety (1-10): 1-2 Pain (1-10): 2   Suicidal Ideation: The patient adamantly denies any suicidal ideations today.  Plan: No  Intent: No  Means: No   Homicidal Ideation: The patient adamantly denies any homicidal ideations today.  Plan: No  Intent: No.  Means: No   General Appearance /Behavior: Casual and cooperative.  Eye Contact: Good.  Speech: Appropriate in rate and volume with no pressuring noted.  Motor Behavior: Appropriate.  Level of Consciousness: Alert and Oriented x 3.  Mental Status: Alert and Oriented x 3.  Mood: Essentially Euthymic.  Affect: Slightly Constricted.  Anxiety Level: Mildly Anxious. Thought Process: wnl.  Thought Content: The patient denies any auditory or visual hallucinations as well as any delusional thinking.  Perception:. wnl.  Judgment: Fair to Good.  Insight: Fair to Good.  Cognition: Oriented to time, place and person.   Time was spent today discussing with the patient the need for her to continue to restrict her free water intact due to her chronic problem with  hyponatremia.  The patient agreed to follow these restrictions and will follow up with her PCP on August 14, 2011 as ordered.  Today's sodium level was 132.  Treatment Plan Summary:  1. Daily contact with patient to assess and evaluate symptoms and progress in treatment  2. Medication management  3. The patient will deny suicidal ideations or homicidal ideations for 48 hours prior to discharge and have a depression and anxiety rating of 3 or less. The patient will also deny any auditory or visual hallucinations or delusional thinking.    Plan:  1. Will continue current medications. 2. Continue to monitor.  3. Discharge today to "[facility]."   4. The patient will see her PCP on August 14, 2011 for ongoing follow up of her hyponatremia. 5. The patient will follow up with [facility] for her psychiatric needs.  SUICIDE RISK:   Minimal: No identifiable suicidal ideation.  Patients presenting with no risk factors but with morbid ruminations; may be classified as minimal risk based on the severity of the depressive symptoms  Natalie Thomas 08/06/2011, 1:46 PM

## 2011-08-06 NOTE — Progress Notes (Signed)
[facility] Group Notes:  (Counselor/Nursing/MHT/Case Management/Adjunct)  08/06/2011 3:24 PM  Type of Therapy:  Group Therapy  Participation Level:  Active  Participation Quality:  Appropriate  Affect:  Appropriate  Cognitive:  Oriented  Insight:  Limited  Engagement in Group:  Good  Engagement in Therapy:  Good  Modes of Intervention:  Education, Problem-solving and Support  Summary of Progress/Problems: Patient talked about her father being so controlling and her attempt at suicide by intending to shoot herself. She was not successful because she could not find the bullets for the gun but she did cut on herself. Encouraged her to think more about the present and positive coping skills if she were to become suicidal. Feels like now she can talk to others about how she feels. Looking forward to discharge.   Natalie Thomas, Aram Beecham 08/06/2011, 3:24 PM

## 2011-08-06 NOTE — Progress Notes (Signed)
Discharge instructions reviewed with pt.  Pt.  Denies SI/HI.  All belongings returned to pt.  Encouragement and support given.  Pt. Receptive.

## 2011-08-06 NOTE — Progress Notes (Signed)
Recreation Therapy Notes  08/06/2011         Time: 0930      Group Topic/Focus: The focus of this group is on discussing various styles of communication and communicating assertively using 'I' (feeling) statements.  Participation Level: Active  Participation Quality: Appropriate  Affect: Appropriate  Cognitive: Oriented   Additional Comments: Patient apologized to RT for yelling at her last week, said she feels better now and is looking forward to discharge today.   Natalie Thomas 08/06/2011 12:34 PM

## 2011-08-08 NOTE — Discharge Summary (Signed)
Physician Discharge Summary Note  Patient:  Natalie Thomas is an 51 y.o., female MRN:  [id number] DOB:  1961-05-11 Patient phone:  [phone] (home)  Patient address:   [address] [place] [address],   Date of Admission:  07/24/2011 Date of Discharge: 08/06/2011  Discharge Diagnoses: Principal Problem:  *Schizoaffective disorder, bipolar type Active Problems:  Hyponatremia   Axis Diagnosis:   AXIS I:  Schizoaffective, Bipolar Type. 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  Past Medical History  Diagnosis Date  . COPD (chronic obstructive pulmonary disease)   . Depression   . GERD (gastroesophageal reflux disease)   . Hypertension   . Osteoporosis   . Diabetes mellitus, type 2     pt denies but states that she has been treated for DM   AXIS IV:  housing problems, stabilized AXIS V:  51-60 moderate symptoms  Level of Care:  OP  Hospital Course:  Louretta presented with suicidal thoughts, depressed and irritable mood, and  Had made threatening statements to her group home staff and peers. She was admitted and restarted on her routine medications. Her diabetes remains stable throughout her stay, and she did not require a basal insulin.  She was admitted to her acute stabilization unit and gradually assimilated into the milieu. She was able to calm down and participate in group therapy in a couple of days. She does suffer from a significant back pain and physical therapy consults were obtained along with the thoracic and lumbar imaging as noted below. In formal discussion with the orthopedics recommended followup by her primary care physician for osteoporosis, and no acute interventions.  Her case manager was instrumental in securing her new placement at an assisted living facility  where physical therapy willl followup, along with nursing to monitor her sodium level. Samara Deist compulsively drinks large amounts of water and ice and was found to have hyponatremia with sodium as low as 121. She was encouraged to drink Gatorade rather than free water, which was restricted to 1000cc;, and she had difficulty complying with this restriction and required quite a bit of coaching. Ultimately Lasix was added to her regimen, which she has taken in the past, and this helps to concentrate her serum and elevate her sodium to 132.  By February 11 she was denying any suicidal thoughts and denying depression. She rated her chronic back pain is 2/10 on a 1-10 scale and her anxiety at 2/10 on a 1-10 scale.   Consults:  Internal Medicine Consult for hyponatremia with diagnosis of Psychogenic Polydipsia. Physical therapy Consult for mobilization and recommended home health care physical therapy for follow up.   Significant Diagnostic Studies:  Sodium ranged 121 - 132 at discharge. Brief course of Lasix 20mg  daily was effective in increasing sodium. Lumbar and Thoracic imaging revealed progression of L1 compression fractures and other chronic compression fractures.   Discharge Vitals:   Blood pressure 148/88, pulse 92, temperature 97.9 F (36.6 C), temperature source Oral, resp. rate 16, height 5\' 2"  (1.575 m), weight 88.905 kg (196 lb).  Mental Status Exam: See Mental Status Examination and Suicide Risk Assessment completed by Attending Physician prior to discharge.  Discharge destination:  ALF  Is patient on multiple antipsychotic therapies at discharge:  No   Has Patient had three or more failed trials of antipsychotic monotherapy by history:  No  Recommended  Plan for Multiple Antipsychotic Therapies: N/A.   Discharge Orders    Future Appointments: Provider: Department: Dept Phone: Center:   08/14/2011 1:15 PM Romero Belling, MD [facility] [phone] [facility]     Future Orders Please Complete  By Expires   Ambulatory referral to Physical Therapy      Comments:   [facility] please visit to evaluate and treat.  Please see notes and records pertaining to spinal compression fractures.  May want to consider lumbar corset for stability, but we were not able to order that here in the hospital.   Discharge diet:      Scheduling Instructions:   Restrict free water to 1000cc daily.   Nursing communication      Scheduling Instructions:   [facility] RN to draw Basic Metabolic Panel on Feb14, Thurs. And report to PCP Romero Belling MD, [facility]. (info on discharge information.)     Medication List  As of 08/08/2011  9:23 AM   STOP taking these medications         Calcium Carbonate-Vitamin D 600-400 MG-UNIT per tablet      clonazePAM 1 MG tablet         TAKE these medications      Indication    albuterol-ipratropium 18-103 MCG/ACT inhaler   Commonly known as: COMBIVENT   Inhale 2 puffs into the lungs 4 (four) times daily as needed for wheezing or shortness of breath. For shortness of breath.       aspirin 81 MG tablet   Take 1 tablet (81 mg total) by mouth daily. For heart health       calcium-vitamin D 500-200 MG-UNIT per tablet   Commonly known as: OSCAL WITH D   Take 1 tablet by mouth 3 (three) times daily at 8am, 2pm and bedtime. For bone health.       cholecalciferol 1000 UNITS tablet   Commonly known as: VITAMIN D   Take 1 tablet (1,000 Units total) by mouth daily. For osteoparosis.       clonazePAM 0.5 MG tablet   Commonly known as: KLONOPIN   Take one tablet (.5 mg) in morning and at bedtime, by mouth. For anxiety       diltiazem 240 MG 24 hr capsule   Commonly known as: DILACOR XR   Take 1 capsule (240 mg total) by mouth daily. For blood pressure and stable heart rhythm       divalproex 500 MG 24 hr tablet   Commonly known as: DEPAKOTE ER   Take one tablet (500mg ) by mouth in the morning, and 2 tablets (1000mg ) by mouth at  bedtime.for mood stability.       docusate sodium 100 MG capsule   Commonly known as: COLACE   Take 1 capsule (100 mg total) by mouth 2 (two) times daily. For bowel regularity.       DULoxetine 60 MG capsule   Commonly known as: CYMBALTA   Take 1 capsule (60 mg total) by mouth 2 (two) times daily with breakfast and lunch. For depression.       Fluticasone-Salmeterol 250-50 MCG/DOSE Aepb   Commonly known as: ADVAIR   Inhale 1 puff into the lungs every 12 (twelve) hours. For copd       furosemide 20 MG tablet   Commonly known as: LASIX   Take 1 tablet (20 mg total) by mouth daily. For fluid.       HYDROcodone-acetaminophen 5-325 MG per tablet  Commonly known as: NORCO   Take 1 tablet by mouth 2 (two) times daily as needed for pain (for chronic back pain.). For chronic back pain       omeprazole 20 MG capsule   Commonly known as: PRILOSEC   Take 1 capsule (20 mg total) by mouth daily. For acid reflux.       perphenazine 8 MG tablet   Commonly known as: TRILAFON   Take 1 tablet (8 mg total) by mouth 2 (two) times daily in the am and at bedtime.. For clear thoughts.       pravastatin 40 MG tablet   Commonly known as: PRAVACHOL   Take 1 tablet (40 mg total) by mouth at bedtime. For cholesterol.       senna-docusate 8.6-50 MG per tablet   Commonly known as: Senokot-S   Take 1 tablet by mouth daily. For bowel regularity.       sodium chloride 1 G tablet   Take 2 tablets (2 g total) by mouth 3 (three) times daily with meals. For hyponatremia            Follow-up Information    Follow up with Romero Belling, MD on 08/14/2011. (@1 :15pm, contact 24 hours in advance if you cannot keep this appt, to avoid  penalty charges.)    Contact information:   [address] [place] [address] [phone]       Follow up with [facility] on 08/16/2011. (3:15 pm for medication management)    Contact information:   [address] [place]  [address] Telephone:  [phone]         Follow-up recommendations:  Activity:  unrestricted, physical therapy recommended. Diet:  regular diet. Restrict free water to 1000cc daily. May have gatorade if insistent on fluids. PCP to follow  Comments:  Patient will reside at [facility] .   Patient is referred to PCP for ongoing management of Osteoparosis, and consider Rx for Lumbar Corset for stability.  Appts as noted above.   This was this patient's second admission for stabilization after threatening behavior at group homes, and if she presents for admission again, consideration should be given to long term treatment at a state facility.   Signed: Charistopher Rumble A 08/08/2011, 9:23 AM

## 2011-08-09 NOTE — Progress Notes (Signed)
Patient Discharge Instructions:  Admission Note Faxed,  08/09/2011 After Visit Summary Faxed,  08/09/2011 Faxed to the [facility] Care provider:  08/09/2011 D/C Summary Note faxed 08/09/2011 Facesheet faxed 08/09/2011  Faxed to [facility] of care @ [phone]  Wandra Scot, 08/09/2011, 3:29 PM

## 2011-08-13 ENCOUNTER — Telehealth: Payer: Self-pay

## 2011-08-13 NOTE — Telephone Encounter (Signed)
appt is overdue.  Let's address then.  Ok to [employer] appt to sda this week

## 2011-08-13 NOTE — Telephone Encounter (Signed)
Appointment schedule for 08/14/2011.
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
## 2011-08-14 ENCOUNTER — Encounter: Payer: Self-pay | Admitting: Endocrinology

## 2011-08-14 ENCOUNTER — Other Ambulatory Visit (INDEPENDENT_AMBULATORY_CARE_PROVIDER_SITE_OTHER): Payer: Medicare Other

## 2011-08-14 ENCOUNTER — Ambulatory Visit (INDEPENDENT_AMBULATORY_CARE_PROVIDER_SITE_OTHER): Payer: Medicare Other | Admitting: Endocrinology

## 2011-08-14 VITALS — BP 162/80 | HR 85 | Temp 98.5°F | Wt 200.0 lb

## 2011-08-14 DIAGNOSIS — R32 Unspecified urinary incontinence: Secondary | ICD-10-CM

## 2011-08-14 DIAGNOSIS — E871 Hypo-osmolality and hyponatremia: Secondary | ICD-10-CM

## 2011-08-14 LAB — BASIC METABOLIC PANEL
Calcium: 9.4 mg/dL (ref 8.4–10.5)
Creatinine, Ser: 0.5 mg/dL (ref 0.4–1.2)
GFR: 142.15 mL/min (ref >60.00)

## 2011-08-14 MED ORDER — DILTIAZEM HCL ER BEADS 360 MG PO CP24: 360.0000 mg | ORAL_CAPSULE | Freq: Every day | ORAL | Status: DC

## 2011-08-14 MED ORDER — INCONTINENCE BRIEF LARGE MISC: 1.0000 | Freq: Four times a day (QID) | Status: DC

## 2011-08-14 MED ORDER — DEMECLOCYCLINE HCL 150 MG PO TABS: 150.0000 mg | ORAL_TABLET | Freq: Two times a day (BID) | ORAL | Status: DC

## 2011-08-14 NOTE — Progress Notes (Signed)
Subjective:    Patient ID: Natalie Thomas, female    DOB: Dec 12, 1960, 51 y.o.   MRN: [id number]  HPI Pt returns for f/u of HTN.  Denies sob.  elev bp has been noted at pt facility of residence. Hyponatremia was also noted.  residential facility staff say they are unable to limit fluid intake. Pt states at least 1 week of slight incontinence from the urethra.  No assoc dysuria.   Past Medical History  Diagnosis Date  . COPD (chronic obstructive pulmonary disease)   . Depression   . GERD (gastroesophageal reflux disease)   . Hypertension   . Osteoporosis   . Diabetes mellitus, type 2     pt denies but states that she has been treated for DM    Past Surgical History  Procedure Date  . Pelvic fracture surgery   . Eye surgery     on left  eye, pt states that she sees bad out of the right eye and needs surgery there    History   Social History  . Marital Status: Divorced    Spouse Name: N/A    Number of Children: N/A  . Years of Education: N/A   Occupational History  . Disabled    Social History Main Topics  . Smoking status: Current Everyday Smoker -- 1.0 packs/day    Types: Cigarettes  . Smokeless tobacco: Not on file  . Alcohol Use: No  . Drug Use: No  . Sexually Active: No   Other Topics Concern  . Not on file   Social History Narrative   single    No current facility-administered medications on file prior to visit.   Current Outpatient Prescriptions on File Prior to Visit  Medication Sig Dispense Refill  . aspirin 81 MG tablet Take 1 tablet (81 mg total) by mouth daily. For heart health  30 tablet  0  . DULoxetine (CYMBALTA) 60 MG capsule Take 1 capsule (60 mg total) by mouth 2 (two) times daily with breakfast and lunch. For depression.  60 capsule  0  . Fluticasone-Salmeterol (ADVAIR DISKUS) 250-50 MCG/DOSE AEPB Inhale 1 puff into the lungs every 12 (twelve) hours. For copd  60 each  0  . furosemide (LASIX) 20 MG tablet Take 1 tablet (20 mg total) by mouth  daily. For fluid.  15 tablet  0  . omeprazole (PRILOSEC) 20 MG capsule Take 1 capsule (20 mg total) by mouth daily. For acid reflux.  30 capsule  0  . sodium chloride 1 G tablet Take 2 tablets (2 g total) by mouth 3 (three) times daily with meals. For hyponatremia  90 tablet  0    Allergies  Allergen Reactions  . Biaxin Itching  . Penicillins Itching    No family history on file.  BP 162/80  Pulse 85  Temp(Src) 98.5 F (36.9 C) (Oral)  Wt 200 lb (90.719 kg)  SpO2 95%     Review of Systems Denies n/v and headache    Objective:   Physical Exam VITAL SIGNS:  See vs page GENERAL: no distress Ext: no edema   Lab Results  Component Value Date   WBC 7.4 08/18/2011   HGB 13.0 08/18/2011   HCT 38.2 08/18/2011   PLT 212 08/18/2011   GLUCOSE 111* 08/18/2011   CHOL 134 04/09/2011   TRIG 81.0 04/09/2011   HDL 48.90 04/09/2011   LDLCALC 69 04/09/2011   ALT 10 08/16/2011   AST 11 08/16/2011   NA 136 08/18/2011  K 3.8 08/18/2011   CL 100 08/18/2011   CREATININE 0.47* 08/18/2011   BUN 9 08/18/2011   CO2 30 08/18/2011   TSH 1.691 07/27/2011   INR 1.0 01/04/2008   HGBA1C 5.6 07/30/2011   MICROALBUR 0.2 04/09/2011  UA: neg    Assessment & Plan:  Hyponatremia, needs increased rx Dysuria, no uti HTN, needs increased rx

## 2011-08-14 NOTE — Patient Instructions (Addendum)
Increase diltiazem to 360 mg daily.  i have sent a prescription to your pharmacy blood tests are being requested for you today.  please call [phone] to hear your test results.  You will be prompted to enter the 9-digit "MRN" number that appears at the top left of this page, followed by #.  Then you will hear the message. i have sent a prescription to your pharmacy, to help with the low salt level. i have also sent a prescription to your pharmacy, for "incontinence briefs." Please come back for a follow-up appointment for 1 month.  Please make an appointment.

## 2011-08-16 ENCOUNTER — Encounter (HOSPITAL_COMMUNITY): Payer: Self-pay | Admitting: *Deleted

## 2011-08-16 ENCOUNTER — Emergency Department (HOSPITAL_COMMUNITY)
Admission: EM | Admit: 2011-08-16 | Discharge: 2011-08-16 | Disposition: A | Discharge status: Other Acute Inpatient Hospital | Payer: Medicare Other | Attending: Emergency Medicine | Admitting: Emergency Medicine

## 2011-08-16 ENCOUNTER — Emergency Department (HOSPITAL_COMMUNITY): Payer: Medicare Other

## 2011-08-16 ENCOUNTER — Ambulatory Visit: Payer: Self-pay | Admitting: Endocrinology

## 2011-08-16 DIAGNOSIS — J449 Chronic obstructive pulmonary disease, unspecified: Secondary | ICD-10-CM | POA: Insufficient documentation

## 2011-08-16 DIAGNOSIS — F3289 Other specified depressive episodes: Secondary | ICD-10-CM | POA: Insufficient documentation

## 2011-08-16 DIAGNOSIS — F17200 Nicotine dependence, unspecified, uncomplicated: Secondary | ICD-10-CM | POA: Insufficient documentation

## 2011-08-16 DIAGNOSIS — Z79899 Other long term (current) drug therapy: Secondary | ICD-10-CM | POA: Insufficient documentation

## 2011-08-16 DIAGNOSIS — F329 Major depressive disorder, single episode, unspecified: Secondary | ICD-10-CM | POA: Insufficient documentation

## 2011-08-16 DIAGNOSIS — J4489 Other specified chronic obstructive pulmonary disease: Secondary | ICD-10-CM | POA: Insufficient documentation

## 2011-08-16 DIAGNOSIS — I10 Essential (primary) hypertension: Secondary | ICD-10-CM | POA: Insufficient documentation

## 2011-08-16 DIAGNOSIS — K219 Gastro-esophageal reflux disease without esophagitis: Secondary | ICD-10-CM | POA: Insufficient documentation

## 2011-08-16 DIAGNOSIS — Z046 Encounter for general psychiatric examination, requested by authority: Secondary | ICD-10-CM | POA: Insufficient documentation

## 2011-08-16 DIAGNOSIS — M810 Age-related osteoporosis without current pathological fracture: Secondary | ICD-10-CM | POA: Insufficient documentation

## 2011-08-16 DIAGNOSIS — E119 Type 2 diabetes mellitus without complications: Secondary | ICD-10-CM | POA: Insufficient documentation

## 2011-08-16 LAB — URINALYSIS, ROUTINE W REFLEX MICROSCOPIC
Hgb urine dipstick: NEGATIVE
Nitrite: NEGATIVE
Protein, ur: 30 mg/dL — AB
Urobilinogen, UA: 1 mg/dL (ref 0.0–1.0)

## 2011-08-16 LAB — COMPREHENSIVE METABOLIC PANEL
AST: 11 U/L (ref 0–37)
BUN: 11 mg/dL (ref 6–23)
CO2: 25 mEq/L (ref 19–32)
Calcium: 9.4 mg/dL (ref 8.4–10.5)
Chloride: 90 mEq/L — ABNORMAL LOW (ref 96–112)
Creatinine, Ser: 0.53 mg/dL (ref 0.50–1.10)
GFR calc Af Amer: >90 mL/min (ref >90)
GFR calc non Af Amer: >90 mL/min (ref >90)
Glucose, Bld: 167 mg/dL — ABNORMAL HIGH (ref 70–99)
Total Bilirubin: 0.3 mg/dL (ref 0.3–1.2)

## 2011-08-16 LAB — CBC
HCT: 41.4 % (ref 36.0–46.0)
Hemoglobin: 15 g/dL (ref 12.0–15.0)
MCV: 90.8 fL (ref 78.0–100.0)
RBC: 4.56 MIL/uL (ref 3.87–5.11)
WBC: 10 10*3/uL (ref 4.0–10.5)

## 2011-08-16 LAB — URINE MICROSCOPIC-ADD ON

## 2011-08-16 LAB — RAPID URINE DRUG SCREEN, HOSP PERFORMED
Barbiturates: NOT DETECTED
Benzodiazepines: NOT DETECTED

## 2011-08-16 LAB — DIFFERENTIAL
Basophils Absolute: 0 10*3/uL (ref 0.0–0.1)
Eosinophils Relative: 1 % (ref 0–5)
Lymphocytes Relative: 24 % (ref 12–46)
Lymphs Abs: 2.4 10*3/uL (ref 0.7–4.0)
Monocytes Absolute: 0.9 10*3/uL (ref 0.1–1.0)
Monocytes Relative: 9 % (ref 3–12)
Neutro Abs: 6.7 10*3/uL (ref 1.7–7.7)

## 2011-08-16 LAB — VALPROIC ACID LEVEL: Valproic Acid Lvl: 48.9 ug/mL — ABNORMAL LOW (ref 50.0–100.0)

## 2011-08-16 NOTE — ED Notes (Signed)
Info faxed to [facility]-contacted [facility] to verify.  Debbie RN is concerned re pt's low Na.  Will check with EDP.

## 2011-08-16 NOTE — ED Notes (Signed)
EDP Campos made aware re Debbie RN from [employer] [facility] re pt's low Na-Per Dr. Patria Mane, pt has hx of hyponatremia and is okay with sending pt back to [facility].
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
## 2011-08-18 ENCOUNTER — Encounter (HOSPITAL_COMMUNITY): Payer: Self-pay | Admitting: Emergency Medicine

## 2011-08-18 ENCOUNTER — Emergency Department (HOSPITAL_COMMUNITY)
Admission: EM | Admit: 2011-08-18 | Discharge: 2011-08-21 | Disposition: A | Discharge status: Other Acute Inpatient Hospital | Payer: Medicare Other | Attending: Psychiatry | Admitting: Psychiatry

## 2011-08-18 DIAGNOSIS — J449 Chronic obstructive pulmonary disease, unspecified: Secondary | ICD-10-CM | POA: Insufficient documentation

## 2011-08-18 DIAGNOSIS — Z79899 Other long term (current) drug therapy: Secondary | ICD-10-CM | POA: Insufficient documentation

## 2011-08-18 DIAGNOSIS — F29 Unspecified psychosis not due to a substance or known physiological condition: Secondary | ICD-10-CM | POA: Insufficient documentation

## 2011-08-18 DIAGNOSIS — I10 Essential (primary) hypertension: Secondary | ICD-10-CM | POA: Insufficient documentation

## 2011-08-18 DIAGNOSIS — F250 Schizoaffective disorder, bipolar type: Secondary | ICD-10-CM

## 2011-08-18 DIAGNOSIS — F259 Schizoaffective disorder, unspecified: Secondary | ICD-10-CM | POA: Insufficient documentation

## 2011-08-18 DIAGNOSIS — H269 Unspecified cataract: Secondary | ICD-10-CM | POA: Insufficient documentation

## 2011-08-18 DIAGNOSIS — J4489 Other specified chronic obstructive pulmonary disease: Secondary | ICD-10-CM | POA: Insufficient documentation

## 2011-08-18 LAB — URINALYSIS, ROUTINE W REFLEX MICROSCOPIC
Glucose, UA: NEGATIVE mg/dL
Hgb urine dipstick: NEGATIVE
Specific Gravity, Urine: 1.018 (ref 1.005–1.030)

## 2011-08-18 LAB — BASIC METABOLIC PANEL
CO2: 30 mEq/L (ref 19–32)
Glucose, Bld: 111 mg/dL — ABNORMAL HIGH (ref 70–99)
Potassium: 3.8 mEq/L (ref 3.5–5.1)
Sodium: 136 mEq/L (ref 135–145)

## 2011-08-18 LAB — RAPID URINE DRUG SCREEN, HOSP PERFORMED
Amphetamines: NOT DETECTED
Benzodiazepines: NOT DETECTED
Cocaine: NOT DETECTED
Opiates: NOT DETECTED
Tetrahydrocannabinol: NOT DETECTED

## 2011-08-18 LAB — CBC
HCT: 38.2 % (ref 36.0–46.0)
Hemoglobin: 13 g/dL (ref 12.0–15.0)
MCH: 31.8 pg (ref 26.0–34.0)
RBC: 4.09 MIL/uL (ref 3.87–5.11)

## 2011-08-18 LAB — VALPROIC ACID LEVEL: Valproic Acid Lvl: 65.8 ug/mL (ref 50.0–100.0)

## 2011-08-18 MED ORDER — SODIUM CHLORIDE 1 G PO TABS: 2.0000 g | ORAL_TABLET | Freq: Three times a day (TID) | ORAL | Status: DC

## 2011-08-18 MED ORDER — DILTIAZEM HCL ER BEADS 240 MG PO CP24
240.0000 mg | ORAL_CAPSULE | Freq: Every day | ORAL | Status: DC
  Administered 2011-08-19 – 2011-08-20 (×2): 240 mg via ORAL
  Filled 2011-08-18 (×3): qty 1

## 2011-08-18 MED ORDER — NICOTINE 21 MG/24HR TD PT24: 21.0000 mg | MEDICATED_PATCH | Freq: Every day | TRANSDERMAL | Status: DC

## 2011-08-18 MED ORDER — PANTOPRAZOLE SODIUM 40 MG PO TBEC
40.0000 mg | DELAYED_RELEASE_TABLET | Freq: Every day | ORAL | Status: DC
  Administered 2011-08-18 – 2011-08-20 (×3): 40 mg via ORAL
  Filled 2011-08-18 (×3): qty 1

## 2011-08-18 MED ORDER — DIVALPROEX SODIUM ER 500 MG PO TB24
1000.0000 mg | ORAL_TABLET | Freq: Every day | ORAL | Status: DC
  Administered 2011-08-19 – 2011-08-20 (×2): 1000 mg via ORAL
  Filled 2011-08-18 (×3): qty 2

## 2011-08-18 MED ORDER — DEMECLOCYCLINE HCL 150 MG PO TABS
150.0000 mg | ORAL_TABLET | Freq: Two times a day (BID) | ORAL | Status: DC
  Filled 2011-08-18: qty 1

## 2011-08-18 MED ORDER — DIVALPROEX SODIUM ER 500 MG PO TB24: 500.0000 mg | ORAL_TABLET | Freq: Every day | ORAL | Status: DC

## 2011-08-18 MED ORDER — ZOLPIDEM TARTRATE 5 MG PO TABS: 5.0000 mg | ORAL_TABLET | Freq: Every evening | ORAL | Status: DC | PRN

## 2011-08-18 MED ORDER — ALUM & MAG HYDROXIDE-SIMETH 200-200-20 MG/5ML PO SUSP: 30.0000 mL | ORAL | Status: DC | PRN

## 2011-08-18 MED ORDER — PERPHENAZINE 8 MG PO TABS
8.0000 mg | ORAL_TABLET | Freq: Two times a day (BID) | ORAL | Status: DC
  Administered 2011-08-18 – 2011-08-20 (×5): 8 mg via ORAL
  Filled 2011-08-18 (×7): qty 1

## 2011-08-18 MED ORDER — DULOXETINE HCL 60 MG PO CPEP: 60.0000 mg | ORAL_CAPSULE | Freq: Two times a day (BID) | ORAL | Status: DC

## 2011-08-18 MED ORDER — PERPHENAZINE 8 MG PO TABS
8.0000 mg | ORAL_TABLET | ORAL | Status: DC
  Filled 2011-08-18: qty 1

## 2011-08-18 MED ORDER — FUROSEMIDE 20 MG PO TABS: 20.0000 mg | ORAL_TABLET | Freq: Every day | ORAL | Status: DC

## 2011-08-18 MED ORDER — IPRATROPIUM-ALBUTEROL 18-103 MCG/ACT IN AERO
2.0000 | INHALATION_SPRAY | Freq: Four times a day (QID) | RESPIRATORY_TRACT | Status: DC | PRN
  Filled 2011-08-18: qty 14.7

## 2011-08-18 MED ORDER — RISPERIDONE 2 MG PO TABS: 2.0000 mg | ORAL_TABLET | Freq: Two times a day (BID) | ORAL | Status: DC

## 2011-08-18 MED ORDER — CLONAZEPAM 0.5 MG PO TABS: 0.5000 mg | ORAL_TABLET | Freq: Every day | ORAL | Status: DC

## 2011-08-18 MED ORDER — SIMVASTATIN 5 MG PO TABS: 5.0000 mg | ORAL_TABLET | Freq: Every day | ORAL | Status: DC

## 2011-08-18 MED ORDER — IBUPROFEN 200 MG PO TABS: 600.0000 mg | ORAL_TABLET | Freq: Three times a day (TID) | ORAL | Status: DC | PRN

## 2011-08-18 MED ORDER — ACETAMINOPHEN 325 MG PO TABS: 650.0000 mg | ORAL_TABLET | ORAL | Status: DC | PRN

## 2011-08-18 MED ORDER — FLUTICASONE-SALMETEROL 250-50 MCG/DOSE IN AEPB
1.0000 | INHALATION_SPRAY | Freq: Two times a day (BID) | RESPIRATORY_TRACT | Status: DC
  Filled 2011-08-18: qty 14

## 2011-08-18 MED ORDER — CLONAZEPAM 0.5 MG PO TABS
0.5000 mg | ORAL_TABLET | Freq: Two times a day (BID) | ORAL | Status: DC | PRN
  Administered 2011-08-19: 0.5 mg via ORAL
  Filled 2011-08-18: qty 1

## 2011-08-18 MED ORDER — ONDANSETRON HCL 4 MG PO TABS: 4.0000 mg | ORAL_TABLET | Freq: Three times a day (TID) | ORAL | Status: DC | PRN

## 2011-08-18 MED ORDER — DIVALPROEX SODIUM ER 500 MG PO TB24
500.0000 mg | ORAL_TABLET | Freq: Every day | ORAL | Status: DC
  Administered 2011-08-19 – 2011-08-20 (×2): 500 mg via ORAL
  Filled 2011-08-18 (×3): qty 1
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
## 2011-08-18 NOTE — ED Notes (Signed)
Mary from [employer] [employer] called stating pt is coming in for evaluation of abnormal labs, noted to have swelling in her feet and legs developing, pt is in process of placement, waiting for placement confirmation at several facilities. Pt is here for medical eval

## 2011-08-18 NOTE — ED Notes (Signed)
Bed:WA19<BR> Expected date:<BR> Expected time:<BR> Means of arrival:<BR> Comments:<BR>
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
## 2011-08-18 NOTE — ED Notes (Signed)
Pt belonging bags (2) are locked in the cabinet in Rm. 19
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
## 2011-08-18 NOTE — BH Assessment (Signed)
According to Natalie Thomas at [facility], Dr. Threasa Beards notes that pt needs to be managed medically and is unable to return to [facility].

## 2011-08-18 NOTE — ED Provider Notes (Addendum)
History     CSN: [id number]  Arrival date & time 08/18/11  1739   First MD Initiated Contact with Patient 08/18/11 1755      Chief Complaint  Patient presents with  . Abnormal Lab   level V caveat due to altered mental status.  (Consider location/radiation/quality/duration/timing/severity/associated sxs/prior treatment) The history is provided by the patient (Information is provided also by [facility]).   patient was sent in from [facility], where she is being held for psych placement. She's reportedly been wandering and hitting staff there. Patient is disorganized cannot tell why she is here. 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 She also was report was sent back for having a low sodium. 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   Past Medical History  Diagnosis Date  . COPD (chronic obstructive pulmonary disease)   . Depression   . GERD (gastroesophageal reflux disease)   . Hypertension   . Osteoporosis   . Diabetes mellitus, type 2     pt denies but states that she has been treated for DM    Past Surgical History  Procedure Date  . Pelvic fracture surgery   . Eye surgery     on left  eye, pt states that she sees bad out of the right eye and needs surgery there    No family history on file.  History  Substance Use Topics  . Smoking status: Current Everyday Smoker -- 1.0 packs/day    Types: Cigarettes  . Smokeless tobacco: Not on file  . Alcohol Use: No    OB History    Grav Para Term Preterm Abortions TAB SAB Ect Mult Living                  Review of Systems  Unable to perform ROS   Allergies  Biaxin and Penicillins  Home Medications   Current Outpatient Rx  Name Route Sig Dispense Refill  . IPRATROPIUM-ALBUTEROL 18-103 MCG/ACT IN AERO Inhalation Inhale 2 puffs into the lungs 4 (four) times daily as needed. For shortness of breath.    . ASPIRIN 81 MG PO TABS Oral Take 1 tablet (81 mg total) by mouth daily. For  heart health 30 tablet 0  . BENZTROPINE MESYLATE 2 MG PO TABS Oral Take 2 mg by mouth every 6 (six) hours as needed.    Marland Kitchen CLONAZEPAM 0.5 MG PO TABS Oral Take 0.5 mg by mouth 2 (two) times daily as needed. For anxiety    . DILTIAZEM HCL ER BEADS 240 MG PO CP24 Oral Take 240 mg by mouth daily.    Marland Kitchen DIVALPROEX SODIUM ER 500 MG PO TB24 Oral Take 500-1,000 mg by mouth See admin instructions. Take one tablet (500mg ) by mouth in the morning, and 2 tablets (1000mg ) by mouth at bedtime.for mood stability.    . DULOXETINE HCL 60 MG PO CPEP Oral Take 1 capsule (60 mg total) by mouth 2 (two) times daily with breakfast and lunch. For depression. 60 capsule 0  . FLUTICASONE-SALMETEROL 250-50 MCG/DOSE IN AEPB Inhalation Inhale 1 puff into the lungs every 12 (twelve) hours. For copd 60 each 0  . FUROSEMIDE 20 MG PO TABS Oral Take 1 tablet (20 mg total) by mouth daily. For fluid. 15 tablet 0  . OMEPRAZOLE 20 MG PO CPDR Oral Take 1 capsule (20 mg total) by mouth daily. For acid reflux. 30 capsule 0  . RISPERIDONE 2 MG PO TABS Oral Take 2 mg by  mouth 2 (two) times daily.    . SODIUM CHLORIDE 1 G PO TABS Oral Take 2 tablets (2 g total) by mouth 3 (three) times daily with meals. For hyponatremia 90 tablet 0    BP 160/65  Pulse 77  Temp(Src) 98.1 F (36.7 C) (Oral)  Resp 16  SpO2 99%  Physical Exam  Constitutional: She appears well-developed.  HENT:  Head: Normocephalic and atraumatic.  Eyes: EOM are normal. Pupils are equal, round, and reactive to light.       Patient states she has either a glass eye ever cataract in her left eye. His examination shows she does not have a left prosthetic eye. She does have an apparent cataract  Neck: Normal range of motion.  Cardiovascular: Normal rate.   Pulmonary/Chest: Effort normal and breath sounds normal.  Abdominal: Soft. She exhibits no distension. There is no tenderness.  Musculoskeletal: Normal range of motion.  Neurological: She is alert.       Patient is  confused and cannot tell me why she is here.  Skin: Skin is warm.    ED Course  Procedures (including critical care time)  Labs Reviewed  URINALYSIS, ROUTINE W REFLEX MICROSCOPIC - Abnormal; Notable for the following:    APPearance CLOUDY (*)    All other components within normal limits  CBC  BASIC METABOLIC PANEL  URINE RAPID DRUG SCREEN (HOSP PERFORMED)  VALPROIC ACID LEVEL  AMMONIA   No results found.   No diagnosis found.    MDM  Patient was sent for [facility] for placement. She's reportedly been wandering and hitting staff. Thoughts are very disorganized here. She's had a history of hyponatremia. Her sodium was normalized here. She is waiting ACT evaluation and possible placement        Harrold Donath R. Rubin Payor, MD 08/18/11 2322  Juliet Rude. Rubin Payor, MD 08/18/11 [phone]

## 2011-08-19 LAB — GLUCOSE, CAPILLARY: Glucose-Capillary: 94 mg/dL (ref 70–99)

## 2011-08-19 MED ORDER — ASPIRIN 81 MG PO CHEW
81.0000 mg | CHEWABLE_TABLET | Freq: Every day | ORAL | Status: DC
  Administered 2011-08-19 – 2011-08-20 (×2): 81 mg via ORAL
  Filled 2011-08-19 (×2): qty 1

## 2011-08-19 MED ORDER — RISPERIDONE 2 MG PO TABS
2.0000 mg | ORAL_TABLET | Freq: Two times a day (BID) | ORAL | Status: DC
  Administered 2011-08-19 – 2011-08-20 (×4): 2 mg via ORAL
  Filled 2011-08-19 (×4): qty 1

## 2011-08-19 MED ORDER — DULOXETINE HCL 60 MG PO CPEP
60.0000 mg | ORAL_CAPSULE | Freq: Two times a day (BID) | ORAL | Status: DC
  Administered 2011-08-19 – 2011-08-20 (×4): 60 mg via ORAL
  Filled 2011-08-19 (×6): qty 1

## 2011-08-19 MED ORDER — CLONAZEPAM 0.125 MG PO TBDP: 0.5000 mg | ORAL_TABLET | Freq: Two times a day (BID) | ORAL | Status: DC

## 2011-08-19 MED ORDER — LORAZEPAM 1 MG PO TABS
2.0000 mg | ORAL_TABLET | Freq: Four times a day (QID) | ORAL | Status: DC | PRN
  Administered 2011-08-20: 2 mg via ORAL
  Filled 2011-08-19: qty 2

## 2011-08-19 MED ORDER — BENZTROPINE MESYLATE 1 MG/ML IJ SOLN: 2.0000 mg | Freq: Two times a day (BID) | INTRAMUSCULAR | Status: DC | PRN

## 2011-08-19 MED ORDER — CLONAZEPAM 0.5 MG PO TABS
0.5000 mg | ORAL_TABLET | Freq: Two times a day (BID) | ORAL | Status: DC
  Administered 2011-08-19 – 2011-08-20 (×3): 0.5 mg via ORAL
  Filled 2011-08-19 (×3): qty 1

## 2011-08-19 NOTE — ED Notes (Signed)
Remains pleasant/cooperative

## 2011-08-19 NOTE — ED Notes (Signed)
Pt incont of urine reports "my water broke again"

## 2011-08-19 NOTE — ED Provider Notes (Addendum)
7:18 AM  Patient is stable pending appropriate disposition.  No acute medical emergencies at this time.   Delusional , schizophrenic.  Under IVC.  [facility] made referral to [facility] .   Holiday Mcmenamin A. Patrica Duel, MD 08/19/11 0719   Meds reviewed.  Her normal daily medications have been added on to her regimen. Remains stable, pending. Disposition  Aliyah Abeyta A. Patrica Duel, MD 08/19/11 1116

## 2011-08-19 NOTE — ED Notes (Signed)
Pt in room watching tv and talking to self and reports that "the man" that's in the room is changing the television channels.  Pt reasurred that there is no one in the room w/ her.  Remote is not working and will replace

## 2011-08-19 NOTE — ED Notes (Signed)
Pt  Talking loud, smiling, redirectable, encouraged to use the bathroom when she feels that she needs to.

## 2011-08-19 NOTE — ED Notes (Signed)
Up to the bathroom 

## 2011-08-19 NOTE — ED Notes (Signed)
Calmer,  Quietly watching tv

## 2011-08-19 NOTE — ED Notes (Signed)
meds given w/ snack

## 2011-08-19 NOTE — ED Provider Notes (Signed)
Medical screening examination/treatment/procedure(s) were performed by non-physician practitioner and as supervising physician I was immediately available for consultation/collaboration.  Alexsandro Salek R. Alisi Lupien, MD 08/19/11 0008 

## 2011-08-20 LAB — CBC
MCH: 32.5 pg (ref 26.0–34.0)
Platelets: 195 10*3/uL (ref 150–400)
RBC: 4.64 MIL/uL (ref 3.87–5.11)
RDW: 12.8 % (ref 11.5–15.5)
WBC: 10.2 10*3/uL (ref 4.0–10.5)

## 2011-08-20 LAB — GLUCOSE, CAPILLARY
Glucose-Capillary: 92 mg/dL (ref 70–99)
Glucose-Capillary: 137 mg/dL — ABNORMAL HIGH (ref 70–99)
Glucose-Capillary: 106 mg/dL — ABNORMAL HIGH (ref 70–99)

## 2011-08-20 MED ORDER — ACETAMINOPHEN 325 MG PO TABS
650.0000 mg | ORAL_TABLET | Freq: Once | ORAL | Status: AC
  Administered 2011-08-20: 650 mg via ORAL
  Filled 2011-08-20: qty 2

## 2011-08-20 MED ORDER — ZIPRASIDONE MESYLATE 20 MG IM SOLR
10.0000 mg | Freq: Once | INTRAMUSCULAR | Status: AC
  Administered 2011-08-20: 10 mg via INTRAMUSCULAR
  Filled 2011-08-20: qty 20

## 2011-08-20 MED ORDER — LOPERAMIDE HCL 2 MG PO CAPS
2.0000 mg | ORAL_CAPSULE | ORAL | Status: DC | PRN
  Administered 2011-08-20 – 2011-08-21 (×3): 2 mg via ORAL
  Filled 2011-08-20 (×3): qty 1

## 2011-08-20 NOTE — ED Provider Notes (Addendum)
Patient noted to have a fever.  She has been having diarrhea. On exam without abdominal ttp.  Pt with general malaise.  Repeat CBC and UA are unremarkable.  Will continue to monitor.   Celene Kras, MD 08/21/11 0040

## 2011-08-20 NOTE — ED Notes (Signed)
Patient has defecated on herself again and refuses to clean herself up after being given the proper supplies. She demanded that I should clean her up stating "I can't, I'm sick". She asked for a cup of ice water which was given to her by [employer].

## 2011-08-20 NOTE — ED Notes (Signed)
Pt screaming and yelling in room.  Assisted to bathroom for shower.  After shower, pt continues to yell and scream at staff and other patients.  Given Ativan per order.

## 2011-08-20 NOTE — ED Notes (Signed)
Patient was offered a shower, she refused.

## 2011-08-20 NOTE — ED Notes (Signed)
Pt refuses to clean herself after bm in her room.  Also refuses to allow staff to help her.  Continues to scream and yell at staff.

## 2011-08-20 NOTE — [facility] Counselor (Signed)
Per Shift report patient is on the [facility] wait list and placed on list by [facility]. Contacted [facility] to verify that patient is on their list and nothing else is needed. Spoke to [place] @ [facility]. He sts that patient is not on their list. Writer found patients referral form in chart and re-faxed information. Also faxed updated labs and clinical notes.

## 2011-08-20 NOTE — ED Provider Notes (Addendum)
Pt awake, alert. Nad. Vitals stable. Awaiting [facility] bed.   Recheck pt agitated, unable to cooperative w telepsych eval. Pt reassured by staff. Remains agitated, verbally abusive. Is purpuseful in movements, ambulatory about room. geodon 10 mg im.  Discussed w staff, need to get telepsych eval and recommendations.   Suzi Roots, MD 08/20/11 4098  Suzi Roots, MD 08/20/11 [phone]

## 2011-08-21 ENCOUNTER — Other Ambulatory Visit: Payer: Self-pay | Admitting: *Deleted

## 2011-08-21 ENCOUNTER — Encounter: Payer: Self-pay | Admitting: Endocrinology

## 2011-08-21 NOTE — ED Notes (Signed)
Pt is awake at this time; denies pain. Requested Diet Coke; provided. Repositioned for comfort. NAD noted.

## 2011-08-21 NOTE — BH Assessment (Signed)
Junious Dresser called the Psych ED this am requesting recent vitals, updated MAR, and authorization # from the [facility]. Writer gathered all information requested and faxed to [facility]. Received call from Junious Dresser stating that patient was accepted to [facility]. Informed patients nurse-Mike and patients doctor-Zackowski of patients acceptance to [facility] Kathlene November called to make transportation arrangements. Pt will remain in the ED awaiting transportation via sheriff.

## 2011-08-21 NOTE — Telephone Encounter (Signed)
Pharmacy informed.

## 2011-08-21 NOTE — ED Notes (Signed)
Dr. Iantha Fallen in to assess patients status.

## 2011-08-21 NOTE — ED Notes (Signed)
Pt had an accidental incontinent episode of stool enroute to the restroom. Pt cleansed and assisted back to bed. Medicated with Imodium as ordered.

## 2011-08-21 NOTE — Telephone Encounter (Signed)
Hydrocodone was stopped because pt refused to see pain specialist. Clonazepam would need to be prescribed by pt's psychiatrist, if she is to take it.  i can make no guarantee that the psychiatrist would approve it.

## 2011-08-21 NOTE — Telephone Encounter (Signed)
R'cd fax from [facility] for refill of Clonazepam 0.5 mg 1 tablet bid and Hydrocodone-APAP 5-325mg  1 tablet bid prn for pain-please advise

## 2011-08-22 LAB — ARGININE VASOPRESSIN HORMONE: Arginine Vasopressin: <1 pg/mL — ABNORMAL LOW

## 2011-08-27 ENCOUNTER — Encounter: Payer: Self-pay | Admitting: Endocrinology

## 2011-08-27 DIAGNOSIS — R631 Polydipsia: Secondary | ICD-10-CM

## 2011-08-27 DIAGNOSIS — F2089 Other schizophrenia: Secondary | ICD-10-CM

## 2011-08-27 DIAGNOSIS — E119 Type 2 diabetes mellitus without complications: Secondary | ICD-10-CM

## 2011-08-27 DIAGNOSIS — I10 Essential (primary) hypertension: Secondary | ICD-10-CM

## 2011-09-13 ENCOUNTER — Ambulatory Visit: Payer: Self-pay | Admitting: Endocrinology

## 2011-09-13 DIAGNOSIS — Z0289 Encounter for other administrative examinations: Secondary | ICD-10-CM

## 2011-10-18 ENCOUNTER — Emergency Department: Payer: Self-pay | Admitting: Internal Medicine

## 2011-10-18 LAB — CBC
HCT: 37.8 % (ref 35.0–47.0)
HGB: 12.6 g/dL (ref 12.0–16.0)
MCH: 32.1 pg (ref 26.0–34.0)
MCV: 96 fL (ref 80–100)
Platelet: 289 10*3/uL (ref 150–440)
RBC: 3.92 10*6/uL (ref 3.80–5.20)
RDW: 13.5 % (ref 11.5–14.5)
WBC: 9.5 10*3/uL (ref 3.6–11.0)

## 2011-10-18 LAB — URINALYSIS, COMPLETE
Bilirubin,UR: NEGATIVE
Blood: NEGATIVE
Ketone: NEGATIVE
Ph: 7 (ref 4.5–8.0)
Specific Gravity: 1.003 (ref 1.003–1.030)
Squamous Epithelial: 1

## 2011-10-18 LAB — COMPREHENSIVE METABOLIC PANEL
Albumin: 3.1 g/dL — ABNORMAL LOW (ref 3.4–5.0)
Alkaline Phosphatase: 95 U/L (ref 50–136)
Anion Gap: 10 (ref 7–16)
BUN: 7 mg/dL (ref 7–18)
EGFR (African American): >60
Potassium: 3.9 mmol/L (ref 3.5–5.1)
SGOT(AST): 17 U/L (ref 15–37)
SGPT (ALT): 28 U/L
Sodium: 144 mmol/L (ref 136–145)

## 2011-10-25 ENCOUNTER — Ambulatory Visit: Payer: Self-pay | Admitting: Endocrinology

## 2011-10-29 ENCOUNTER — Ambulatory Visit: Payer: Self-pay | Admitting: Endocrinology

## 2011-10-30 ENCOUNTER — Ambulatory Visit: Payer: Self-pay | Admitting: Endocrinology

## 2011-10-30 DIAGNOSIS — Z0289 Encounter for other administrative examinations: Secondary | ICD-10-CM

## 2011-10-31 ENCOUNTER — Ambulatory Visit: Payer: Self-pay | Admitting: Internal Medicine

## 2011-10-31 LAB — CBC WITH DIFFERENTIAL/PLATELET
Basophil #: 0 10*3/uL (ref 0.0–0.1)
Basophil %: 0.4 %
Eosinophil #: 0 10*3/uL (ref 0.0–0.7)
Eosinophil %: 0 %
HCT: 38.5 % (ref 35.0–47.0)
HGB: 13 g/dL (ref 12.0–16.0)
Lymphocyte #: 2.8 10*3/uL (ref 1.0–3.6)
Lymphocyte %: 26.9 %
MCH: 32.1 pg (ref 26.0–34.0)
MCHC: 33.7 g/dL (ref 32.0–36.0)
MCV: 96 fL (ref 80–100)
Monocyte #: 0.6 x10 3/mm (ref 0.2–0.9)
Monocyte %: 5.8 %
Neutrophil #: 6.9 10*3/uL — ABNORMAL HIGH (ref 1.4–6.5)
Neutrophil %: 66.9 %
Platelet: 255 10*3/uL (ref 150–440)
RBC: 4.04 10*6/uL (ref 3.80–5.20)
RDW: 13.7 % (ref 11.5–14.5)
WBC: 10.2 10*3/uL (ref 3.6–11.0)

## 2011-10-31 LAB — COMPREHENSIVE METABOLIC PANEL
Albumin: 3.4 g/dL (ref 3.4–5.0)
Alkaline Phosphatase: 94 U/L (ref 50–136)
Anion Gap: 5 — ABNORMAL LOW (ref 7–16)
BUN: 8 mg/dL (ref 7–18)
Bilirubin,Total: 0.2 mg/dL (ref 0.2–1.0)
Calcium, Total: 8.6 mg/dL (ref 8.5–10.1)
Chloride: 94 mmol/L — ABNORMAL LOW (ref 98–107)
Co2: 27 mmol/L (ref 21–32)
Creatinine: 0.5 mg/dL — ABNORMAL LOW (ref 0.60–1.30)
EGFR (African American): >60
EGFR (Non-African Amer.): >60
Glucose: 93 mg/dL (ref 65–99)
Osmolality: 251 (ref 275–301)
Potassium: 4.2 mmol/L (ref 3.5–5.1)
SGOT(AST): 13 U/L — ABNORMAL LOW (ref 15–37)
SGPT (ALT): 20 U/L
Sodium: 126 mmol/L — ABNORMAL LOW (ref 136–145)
Total Protein: 7.2 g/dL (ref 6.4–8.2)

## 2011-10-31 LAB — LIPID PANEL
Cholesterol: 139 mg/dL (ref 0–200)
HDL Cholesterol: 48 mg/dL (ref 40–60)
Ldl Cholesterol, Calc: 64 mg/dL (ref 0–100)
Triglycerides: 134 mg/dL (ref 0–200)
VLDL Cholesterol, Calc: 27 mg/dL (ref 5–40)

## 2011-10-31 LAB — HEMOGLOBIN A1C: Hemoglobin A1C: 5.2 % (ref 4.2–6.3)

## 2011-10-31 LAB — TSH: Thyroid Stimulating Horm: 1.78 u[IU]/mL

## 2011-11-18 LAB — COMPREHENSIVE METABOLIC PANEL
Alkaline Phosphatase: 89 U/L (ref 50–136)
Anion Gap: 7 (ref 7–16)
Bilirubin,Total: 0.2 mg/dL (ref 0.2–1.0)
Calcium, Total: 8.7 mg/dL (ref 8.5–10.1)
Chloride: 101 mmol/L (ref 98–107)
Creatinine: 0.51 mg/dL — ABNORMAL LOW (ref 0.60–1.30)
Glucose: 109 mg/dL — ABNORMAL HIGH (ref 65–99)
Osmolality: 267 (ref 275–301)
Potassium: 3.8 mmol/L (ref 3.5–5.1)
SGPT (ALT): 17 U/L
Total Protein: 7.1 g/dL (ref 6.4–8.2)

## 2011-11-18 LAB — CBC
HCT: 40.8 % (ref 35.0–47.0)
HGB: 13.7 g/dL (ref 12.0–16.0)
MCH: 32 pg (ref 26.0–34.0)
MCHC: 33.6 g/dL (ref 32.0–36.0)
MCV: 95 fL (ref 80–100)
Platelet: 248 10*3/uL (ref 150–440)
RBC: 4.28 10*6/uL (ref 3.80–5.20)
RDW: 13.4 % (ref 11.5–14.5)
WBC: 9.4 10*3/uL (ref 3.6–11.0)

## 2011-11-18 LAB — CK TOTAL AND CKMB (NOT AT ARMC)
CK, Total: 106 U/L (ref 21–215)
CK-MB: 3.3 ng/mL (ref 0.5–3.6)

## 2011-11-18 LAB — TROPONIN I: Troponin-I: <0.02 ng/mL

## 2011-11-19 ENCOUNTER — Observation Stay: Payer: Self-pay | Admitting: Internal Medicine

## 2011-11-19 LAB — TROPONIN I: Troponin-I: <0.02 ng/mL

## 2011-11-20 LAB — CBC WITH DIFFERENTIAL/PLATELET
Basophil #: 0 10*3/uL (ref 0.0–0.1)
Basophil %: 0.6 %
Eosinophil #: 0 10*3/uL (ref 0.0–0.7)
Eosinophil %: 0 %
HCT: 42.5 % (ref 35.0–47.0)
HGB: 14.1 g/dL (ref 12.0–16.0)
Lymphocyte #: 2 10*3/uL (ref 1.0–3.6)
Lymphocyte %: 22.5 %
MCH: 31.1 pg (ref 26.0–34.0)
MCHC: 33.2 g/dL (ref 32.0–36.0)
MCV: 94 fL (ref 80–100)
Monocyte #: 0.6 x10 3/mm (ref 0.2–0.9)
Monocyte %: 7.2 %
Neutrophil #: 6.1 10*3/uL (ref 1.4–6.5)
Neutrophil %: 69.7 %
Platelet: 286 10*3/uL (ref 150–440)
RBC: 4.55 10*6/uL (ref 3.80–5.20)
RDW: 13.2 % (ref 11.5–14.5)
WBC: 8.7 10*3/uL (ref 3.6–11.0)

## 2011-11-20 LAB — URINALYSIS, COMPLETE
Bilirubin,UR: NEGATIVE
Blood: NEGATIVE
Glucose,UR: NEGATIVE mg/dL (ref 0–75)
Ketone: NEGATIVE
Leukocyte Esterase: NEGATIVE
Nitrite: NEGATIVE
Ph: 7 (ref 4.5–8.0)
Protein: NEGATIVE
RBC,UR: NONE SEEN /HPF (ref 0–5)
Specific Gravity: 1.003 (ref 1.003–1.030)
Squamous Epithelial: 1
WBC UR: <1 /HPF (ref 0–5)

## 2011-11-20 LAB — BASIC METABOLIC PANEL
Anion Gap: 8 (ref 7–16)
BUN: 8 mg/dL (ref 7–18)
Calcium, Total: 8.7 mg/dL (ref 8.5–10.1)
Chloride: 93 mmol/L — ABNORMAL LOW (ref 98–107)
Co2: 25 mmol/L (ref 21–32)
Creatinine: 0.53 mg/dL — ABNORMAL LOW (ref 0.60–1.30)
EGFR (African American): >60
EGFR (Non-African Amer.): >60
Glucose: 107 mg/dL — ABNORMAL HIGH (ref 65–99)
Osmolality: 252 (ref 275–301)
Potassium: 4.8 mmol/L (ref 3.5–5.1)
Sodium: 126 mmol/L — ABNORMAL LOW (ref 136–145)

## 2011-11-20 LAB — LITHIUM LEVEL: Lithium: 0.86 mmol/L

## 2011-11-27 ENCOUNTER — Ambulatory Visit: Payer: Self-pay | Admitting: Ophthalmology

## 2011-12-04 ENCOUNTER — Ambulatory Visit: Payer: Self-pay | Admitting: Ophthalmology

## 2011-12-04 LAB — SODIUM: Sodium: 135 mmol/L — ABNORMAL LOW (ref 136–145)

## 2012-05-12 LAB — COMPREHENSIVE METABOLIC PANEL
Albumin: 3.8 g/dL (ref 3.4–5.0)
Alkaline Phosphatase: 108 U/L (ref 50–136)
Bilirubin,Total: 0.3 mg/dL (ref 0.2–1.0)
Creatinine: 0.53 mg/dL — ABNORMAL LOW (ref 0.60–1.30)
EGFR (Non-African Amer.): >60
Glucose: 121 mg/dL — ABNORMAL HIGH (ref 65–99)
Sodium: 126 mmol/L — ABNORMAL LOW (ref 136–145)
Total Protein: 8 g/dL (ref 6.4–8.2)

## 2012-05-12 LAB — CBC
HCT: 47.8 % — ABNORMAL HIGH (ref 35.0–47.0)
HGB: 15.5 g/dL (ref 12.0–16.0)
MCH: 30 pg (ref 26.0–34.0)
MCHC: 32.5 g/dL (ref 32.0–36.0)
RDW: 16.1 % — ABNORMAL HIGH (ref 11.5–14.5)

## 2012-05-12 LAB — TROPONIN I: Troponin-I: <0.02 ng/mL

## 2012-05-13 ENCOUNTER — Inpatient Hospital Stay: Payer: Self-pay | Admitting: Internal Medicine

## 2012-05-13 LAB — BASIC METABOLIC PANEL
Anion Gap: 5 — ABNORMAL LOW (ref 7–16)
BUN: 7 mg/dL (ref 7–18)
Calcium, Total: 9.2 mg/dL (ref 8.5–10.1)
Chloride: 92 mmol/L — ABNORMAL LOW (ref 98–107)
Co2: 29 mmol/L (ref 21–32)
Creatinine: 0.51 mg/dL — ABNORMAL LOW (ref 0.60–1.30)
Osmolality: 254 (ref 275–301)
Potassium: 5 mmol/L (ref 3.5–5.1)
Sodium: 126 mmol/L — ABNORMAL LOW (ref 136–145)

## 2012-05-13 LAB — SODIUM, URINE, RANDOM: Sodium, Urine Random: 27 mmol/L (ref 20–110)

## 2012-05-13 LAB — OSMOLALITY, URINE: Osmolality: 99 mOsm/kg

## 2012-05-14 LAB — COMPREHENSIVE METABOLIC PANEL
Albumin: 3.3 g/dL — ABNORMAL LOW (ref 3.4–5.0)
Alkaline Phosphatase: 85 U/L (ref 50–136)
Anion Gap: 3 — ABNORMAL LOW (ref 7–16)
Bilirubin,Total: 0.5 mg/dL (ref 0.2–1.0)
Co2: 31 mmol/L (ref 21–32)
Creatinine: 0.52 mg/dL — ABNORMAL LOW (ref 0.60–1.30)
Osmolality: 253 (ref 275–301)
Sodium: 125 mmol/L — ABNORMAL LOW (ref 136–145)
Total Protein: 6.8 g/dL (ref 6.4–8.2)

## 2012-05-14 LAB — CBC WITH DIFFERENTIAL/PLATELET
Basophil #: 0.1 10*3/uL (ref 0.0–0.1)
Eosinophil #: 0 10*3/uL (ref 0.0–0.7)
Lymphocyte %: 7.2 %
MCH: 29.5 pg (ref 26.0–34.0)
Neutrophil #: 8.6 10*3/uL — ABNORMAL HIGH (ref 1.4–6.5)
Neutrophil %: 89.7 %
Platelet: 194 10*3/uL (ref 150–440)
RBC: 4.59 10*6/uL (ref 3.80–5.20)

## 2012-05-15 LAB — BASIC METABOLIC PANEL
Anion Gap: 5 — ABNORMAL LOW (ref 7–16)
BUN: 11 mg/dL (ref 7–18)
Chloride: 85 mmol/L — ABNORMAL LOW (ref 98–107)
Glucose: 168 mg/dL — ABNORMAL HIGH (ref 65–99)
Potassium: 4.6 mmol/L (ref 3.5–5.1)

## 2012-05-16 LAB — BASIC METABOLIC PANEL
Anion Gap: 3 — ABNORMAL LOW (ref 7–16)
Chloride: 95 mmol/L — ABNORMAL LOW (ref 98–107)
EGFR (African American): >60

## 2012-05-18 LAB — CULTURE, BLOOD (SINGLE)

## 2012-05-30 LAB — CBC
MCH: 29.3 pg (ref 26.0–34.0)
MCHC: 32.4 g/dL (ref 32.0–36.0)
MCV: 91 fL (ref 80–100)
Platelet: 154 10*3/uL (ref 150–440)
RBC: 5.16 10*6/uL (ref 3.80–5.20)

## 2012-05-31 ENCOUNTER — Inpatient Hospital Stay: Payer: Self-pay | Admitting: Internal Medicine

## 2012-05-31 LAB — COMPREHENSIVE METABOLIC PANEL
Calcium, Total: 8.6 mg/dL (ref 8.5–10.1)
Chloride: 94 mmol/L — ABNORMAL LOW (ref 98–107)
Co2: 30 mmol/L (ref 21–32)
Creatinine: 0.4 mg/dL — ABNORMAL LOW (ref 0.60–1.30)
Osmolality: 261 (ref 275–301)
Potassium: 4.3 mmol/L (ref 3.5–5.1)
Sodium: 130 mmol/L — ABNORMAL LOW (ref 136–145)

## 2012-06-01 LAB — CBC WITH DIFFERENTIAL/PLATELET
Basophil %: 0.4 %
Eosinophil #: 0 10*3/uL (ref 0.0–0.7)
HCT: 45.9 % (ref 35.0–47.0)
HGB: 14.9 g/dL (ref 12.0–16.0)
MCH: 29.1 pg (ref 26.0–34.0)
MCHC: 32.5 g/dL (ref 32.0–36.0)
Monocyte #: 0.2 x10 3/mm (ref 0.2–0.9)
Monocyte %: 1.6 %
Neutrophil #: 11.5 10*3/uL — ABNORMAL HIGH (ref 1.4–6.5)
Neutrophil %: 92.9 %
Platelet: 166 10*3/uL (ref 150–440)
RDW: 17.2 % — ABNORMAL HIGH (ref 11.5–14.5)
WBC: 12.4 10*3/uL — ABNORMAL HIGH (ref 3.6–11.0)

## 2012-06-01 LAB — BASIC METABOLIC PANEL
Anion Gap: 8 (ref 7–16)
Calcium, Total: 8.9 mg/dL (ref 8.5–10.1)
Chloride: 86 mmol/L — ABNORMAL LOW (ref 98–107)
Co2: 29 mmol/L (ref 21–32)
Creatinine: 0.53 mg/dL — ABNORMAL LOW (ref 0.60–1.30)
EGFR (African American): >60
EGFR (Non-African Amer.): >60
Glucose: 258 mg/dL — ABNORMAL HIGH (ref 65–99)
Sodium: 123 mmol/L — ABNORMAL LOW (ref 136–145)

## 2012-06-01 LAB — SODIUM, URINE, RANDOM: Sodium, Urine Random: 7 mmol/L (ref 20–110)

## 2012-06-01 LAB — OSMOLALITY, URINE: Osmolality: 163 mOsm/kg

## 2012-06-02 LAB — TSH: Thyroid Stimulating Horm: 0.428 u[IU]/mL — ABNORMAL LOW

## 2012-06-02 LAB — BASIC METABOLIC PANEL
Anion Gap: 9 (ref 7–16)
BUN: 11 mg/dL (ref 7–18)
Chloride: 88 mmol/L — ABNORMAL LOW (ref 98–107)
Co2: 27 mmol/L (ref 21–32)
Creatinine: 0.51 mg/dL — ABNORMAL LOW (ref 0.60–1.30)
Glucose: 229 mg/dL — ABNORMAL HIGH (ref 65–99)
Sodium: 124 mmol/L — ABNORMAL LOW (ref 136–145)

## 2012-06-02 LAB — CHLORIDE, URINE, RANDOM: Chloride, Urine Random: <10 mmol/L — ABNORMAL LOW (ref 55–125)

## 2012-06-02 LAB — EXPECTORATED SPUTUM ASSESSMENT W GRAM STAIN, RFLX TO RESP C

## 2012-06-02 LAB — HEMOGLOBIN A1C: Hemoglobin A1C: 6.3 % (ref 4.2–6.3)

## 2012-06-03 LAB — BASIC METABOLIC PANEL
Calcium, Total: 8.6 mg/dL (ref 8.5–10.1)
Co2: 32 mmol/L (ref 21–32)
Creatinine: 0.32 mg/dL — ABNORMAL LOW (ref 0.60–1.30)
EGFR (African American): >60
EGFR (Non-African Amer.): >60
Glucose: 171 mg/dL — ABNORMAL HIGH (ref 65–99)
Potassium: 4.6 mmol/L (ref 3.5–5.1)
Sodium: 125 mmol/L — ABNORMAL LOW (ref 136–145)

## 2012-06-03 LAB — SODIUM
Sodium: 119 mmol/L — CL (ref 136–145)
Sodium: 127 mmol/L — ABNORMAL LOW (ref 136–145)

## 2012-06-04 LAB — WBC: WBC: 7.9 10*3/uL (ref 3.6–11.0)

## 2012-06-04 LAB — BASIC METABOLIC PANEL
Anion Gap: 5 — ABNORMAL LOW (ref 7–16)
BUN: 10 mg/dL (ref 7–18)
Chloride: 93 mmol/L — ABNORMAL LOW (ref 98–107)
Creatinine: 0.43 mg/dL — ABNORMAL LOW (ref 0.60–1.30)
EGFR (Non-African Amer.): >60
Potassium: 4.4 mmol/L (ref 3.5–5.1)
Sodium: 131 mmol/L — ABNORMAL LOW (ref 136–145)

## 2012-06-04 LAB — SODIUM
Sodium: 132 mmol/L — ABNORMAL LOW (ref 136–145)
Sodium: 127 mmol/L — ABNORMAL LOW (ref 136–145)
Sodium: 125 mmol/L — ABNORMAL LOW (ref 136–145)
Sodium: 134 mmol/L — ABNORMAL LOW (ref 136–145)

## 2012-06-05 LAB — SODIUM
Sodium: 135 mmol/L — ABNORMAL LOW (ref 136–145)
Sodium: 134 mmol/L — ABNORMAL LOW (ref 136–145)

## 2012-06-05 LAB — BASIC METABOLIC PANEL
Anion Gap: 6 — ABNORMAL LOW (ref 7–16)
Calcium, Total: 8.6 mg/dL (ref 8.5–10.1)
Chloride: 95 mmol/L — ABNORMAL LOW (ref 98–107)
Co2: 33 mmol/L — ABNORMAL HIGH (ref 21–32)
Creatinine: 0.69 mg/dL (ref 0.60–1.30)
EGFR (African American): >60
EGFR (Non-African Amer.): >60
Glucose: 90 mg/dL (ref 65–99)
Osmolality: 267 (ref 275–301)

## 2012-06-05 LAB — T4, FREE: Free Thyroxine: 1.23 ng/dL (ref 0.76–1.46)

## 2012-06-22 ENCOUNTER — Inpatient Hospital Stay: Payer: Self-pay | Admitting: Internal Medicine

## 2012-06-22 LAB — COMPREHENSIVE METABOLIC PANEL
Anion Gap: 8 (ref 7–16)
BUN: 3 mg/dL — ABNORMAL LOW (ref 7–18)
Bilirubin,Total: 0.2 mg/dL (ref 0.2–1.0)
Chloride: 99 mmol/L (ref 98–107)
Co2: 29 mmol/L (ref 21–32)
Creatinine: 0.58 mg/dL — ABNORMAL LOW (ref 0.60–1.30)
EGFR (African American): >60
EGFR (Non-African Amer.): >60
Osmolality: 271 (ref 275–301)
Potassium: 3.3 mmol/L — ABNORMAL LOW (ref 3.5–5.1)
SGPT (ALT): 11 U/L — ABNORMAL LOW (ref 12–78)

## 2012-06-22 LAB — PROTIME-INR
INR: 1
Prothrombin Time: 13.6 secs (ref 11.5–14.7)

## 2012-06-22 LAB — CBC
HGB: 14.4 g/dL (ref 12.0–16.0)
MCHC: 33 g/dL (ref 32.0–36.0)
MCV: 89 fL (ref 80–100)
Platelet: 189 10*3/uL (ref 150–440)
RBC: 4.89 10*6/uL (ref 3.80–5.20)
RDW: 17.4 % — ABNORMAL HIGH (ref 11.5–14.5)

## 2012-06-22 LAB — TROPONIN I: Troponin-I: <0.02 ng/mL

## 2012-06-22 LAB — URINALYSIS, COMPLETE
Bacteria: NONE SEEN
Bilirubin,UR: NEGATIVE
Blood: NEGATIVE
Glucose,UR: NEGATIVE mg/dL (ref 0–75)
Ketone: NEGATIVE
Ph: 7 (ref 4.5–8.0)
Protein: NEGATIVE
Specific Gravity: 1.003 (ref 1.003–1.030)
Squamous Epithelial: 2

## 2012-06-22 LAB — CK TOTAL AND CKMB (NOT AT ARMC)
CK, Total: 40 U/L (ref 21–215)
CK-MB: 0.8 ng/mL (ref 0.5–3.6)

## 2012-06-23 LAB — BASIC METABOLIC PANEL
Anion Gap: 8 (ref 7–16)
Chloride: 92 mmol/L — ABNORMAL LOW (ref 98–107)
Co2: 28 mmol/L (ref 21–32)
Creatinine: 0.42 mg/dL — ABNORMAL LOW (ref 0.60–1.30)
EGFR (African American): >60
EGFR (Non-African Amer.): >60
Glucose: 237 mg/dL — ABNORMAL HIGH (ref 65–99)
Osmolality: 262 (ref 275–301)

## 2014-10-12 NOTE — H&P (Signed)
PATIENT NAME:  Natalie Thomas, Natalie Thomas MR#:  [id number] DATE OF BIRTH:  1961-03-03  DATE OF ADMISSION:  05/13/2012  REFERRING PHYSICIAN: Lavonia Drafts, MD  PRIMARY CARE PHYSICIAN: Cletis Athens, MD  CHIEF COMPLAINT: Shortness of breath.   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 The patient has been complaining of shortness of breath for the last couple of days, as well reporting significant cough and some mild productive sputum, white in color, but reports she feels significant congestion where she cannot cough her sputum. In the ED, the patient was hypoxic where she required to be on oxygen, where currently she is 92% on 5 liters nasal cannula, has diffuse wheezing despite getting IV Solu-Medrol and nebulizer treatment, so hospitalist was requested to admit the patient for further management of her chronic obstructive pulmonary disease exacerbation. The patient denies any chest pain, any palpitations or lightheadedness, any coffee-ground emesis, any bright red blood per rectum, or melena. Denies any fever or chills at home. The patient had chest x-ray done which did not show any opacity or infiltrate.  PAST MEDICAL HISTORY:  1. Hypertension.  2. Hypercholesterolemia.  3. Chronic obstructive pulmonary disease.  4. Gastroesophageal reflux disease.  5. Diabetes mellitus, controlled with diet.  6. Manic depressive illness.   PAST SURGICAL HISTORY: Cholecystectomy.   FAMILY HISTORY: Significant for diabetes mellitus and chronic obstructive pulmonary disease in the family.   SOCIAL HISTORY: The patient lives at home, unemployed, living on disability, still continues to smoke 1 pack per day since the age of 73. No history of  alcohol abuse. No history of drug abuse.   HOME MEDICATIONS:  1. Zyprexa 15 mg oral p.o. twice a day. 2. Xopenex inhalation.  3. Tylenol 325 mg 2 tablets every four hours as needed.  4. Prilosec 20 mg oral daily.  5. Paxil oral daily.  6. Lithium 300 mg oral in the morning and 150 mg at bedtime.  7. Imdur 30 mg oral daily.  8. Habitrol 21 mg topically daily.  9. Prilosec 20 mg oral twice a day. 10. Clonazepam 1 mg oral daily.  11. Diazepam 0.5 mg oral daily.  12. Cardizem LA 240 mg oral daily.  13. Coreg 6.25 mg oral twice a day.  ALLERGIES: Biaxin and penicillin.   REVIEW OF SYSTEMS: The patient denies any fever or fatigue. Complains of generalized weakness. EYES: Denies blurry vision, double vision, or eye pain. ENT: Denies tinnitus, ear pain, or hearing loss. RESPIRATORY: Denies any hemoptysis. Has complaints of cough, productive with white sputum. Complains of significant wheezing and known history of chronic obstructive pulmonary disease. CARDIOVASCULAR: Denies chest pain, edema, arrhythmia, palpitations, or syncope. GASTROINTESTINAL: Denies nausea, vomiting, diarrhea, abdominal pain, or hematemesis. GU: Denies dysuria, hematuria, or renal colic. ENDOCRINE: Denies polyuria, polydipsia, heat or cold intolerance. HEMATOLOGY: Denies anemia, easy bruising, or bleeding diathesis. INTEGUMENTARY: Denies any acne, rash, or lesions. MUSCULOSKELETAL: Denies any gout, swelling, arthritis, or cramps. NEUROLOGIC: Denies any weakness, dysarthria, epilepsy, tremors, vertigo, ataxia, or dementia. PSYCH: Has manic depressive disorder. Has some anxiety issues as well. Denies any substance or alcohol abuse.   PHYSICAL EXAMINATION:   VITAL SIGNS: Temperature 99.1, pulse 84, respiratory 20, blood pressure 138/67, respiratory rate 20, and saturating 94% on 5 liters nasal cannula.   GENERAL: Well-nourished female  who is comfortable and in no apparent distress.   HEENT: Head atraumatic, normocephalic. Pupils  are equal and reactive to light. Pink conjunctivae. Anicteric sclerae. Moist oral mucosa.   NECK: Supple. No thyromegaly. No JVD.   PULMONARY: The patient had decreased air entry bilaterally with diffuse wheezing. No rales or rhonchi.   CARDIOVASCULAR: S1 and S2 heard. No rubs, murmurs, or gallops.   ABDOMEN: Soft, nontender, and nondistended. Bowel sounds present.   EXTREMITIES: No edema. No clubbing. No cyanosis.   PSYCHIATRIC: Appropriate affect. Awake and alert x3. Intact judgment and insight.   NEUROLOGIC: Cranial nerves grossly intact. Motor five out of five strength in all extremities.   SKIN: Normal skin turgor. Warm and dry.   PERTINENT RESULTS: Glucose 121, BUN 8, creatinine 0.53, sodium 126, potassium 4.3, chloride 92, and CO2 33. Troponin less than 0.02. White blood cells 11, hemoglobin 15.5, hematocrit 47.8, and platelets 234.   X-ray does not show any infiltrate or opacity, awaiting official reading.   ASSESSMENT AND PLAN:  1. Chronic obstructive pulmonary disease exacerbation. The patient is known to have history of chronic obstructive pulmonary disease, but not on any home oxygen. Currently requiring 5 liters nasal cannula. The patient will be admitted for inpatient status for further management of her acute chronic obstructive pulmonary disease exacerbation. She will be started on Solu-Medrol 80 mg every six hours and DuoNebs every four hours and albuterol every two hours as needed. She will be started on Levaquin as well and will be kept on oxygen via nasal cannula to keep her oxygen saturation around 93. We will obtain an ABG for baseline.  2. Hyponatremia. Seems to be chronic and recurrent. Etiology is unclear. Corrected with fluids during previous times, so this is most likely related to volume depletion, so start the patient on IV normal saline 75 mL per hour, as well we will check urine osmolality and sodium.  3. Systemic hypertension. We will continue the patient's  home medications, but we will decrease her Coreg dose to 3.125 mg oral p.o. twice a day as she was noticed on telemonitor to have her heart rate in the mid 79s.  4. Gastroesophageal reflux disease. We will continue with PPI, but will increase the dose to p.o. twice a day as she is on large dose steroids.  5. Diabetes mellitus, usually diet-controlled, but as the patient is on steroids we will have her on insulin sliding scale.  6. Manic depressive disorder. We will continue the patient on her home medication.  7. Deep vein thrombosis prophylaxis. Subcutaneous heparin.   CODE STATUS: FULL CODE.   TOTAL TIME SPENT ON ADMISSION AND PATIENT CARE: 55 minutes.  ____________________________ Albertine Patricia, MD dse:slb D: 05/13/2012 02:08:51 ET T: 05/13/2012 07:26:45 ET JOB#: [id number]  cc: Albertine Patricia, MD, <Dictator> Cletis Athens, MD DAWOOD Graciela Husbands MD ELECTRONICALLY SIGNED 05/16/2012 5:02

## 2014-10-12 NOTE — Consult Note (Signed)
Chief Complaint and History:   Referring Physician Dr. Ether Griffins    Chief Complaint Hyponatremia   Allergies:  Biaxin: Unknown  Penicillin: Unknown  Assessment/Plan:   Assessment/Plan 54 yo F with COPD admitted on 11/19 with SOB attributed to COPD exacerbation found to have hyponatrmia with a serum sodium in the range of 120-126. On 11/19 when serum Na was 126, BNP was 916, serum Osm was low at 272, surine Osm was low at 99, and urine Na was low at 27. She has normal renal fxn with a creatinine in the 0.53-0.56 range. She has a normal potassium, which today is 4.6. She has not been on thiazides. She has been receiving furosemide 20 mg daily since yesterda. She has been on solumedrol 80 mg q6hrs since admission. She claims she has had low sodium for years, however she is not a reliable historian. She has biopolar disorder and takes lithium. Her lithium level on 11/19 was 1.10. She believes she drinks a lot of water but cannot quantify how much. She also urinates frequently and this year started having enuresis.  Pt was seen, interviewed, examined, and chart was reviewed.  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 other causes of low sodium such as SIADH and AI are less likely with a low urine sodium and low urine Osm. Of note however, she was on Paxil as an out-patient and Paxil can cause SIADH. Diabetes insipidis (either primary or nephrogenic) does not present with hyponatremia, thus does not fit her case.   P/ -Agree with free water restriction. -It would be worthwhile repeat serum Na, serum Osm, urine Na and urine Osm studies while she is not taking furosemide to confirm the diagnosis.  Full consult to follow.   Electronic Signatures: Judi Cong (MD)  (Signed 21-Nov-13 14:46)  Authored: Chief Complaint and History, ALLERGIES, Assessment/Plan   Last Updated:  21-Nov-13 14:46 by Judi Cong (MD)

## 2014-10-12 NOTE — Consult Note (Signed)
PATIENT NAME:  Natalie Thomas, Natalie Thomas MR#:  [id number] DATE OF BIRTH:  Feb 03, 1961  DATE OF ADMISSION: 05/12/2012 DATE OF CONSULTATION:  05/15/2012  REFERRING PHYSICIAN:  Theodoro Grist, MD   CONSULTING PHYSICIAN:  A. Lavone Orn, MD  CHIEF COMPLAINT: Hyponatremia.   HISTORY OF PRESENT ILLNESS: This is a 54 year old female who was admitted on 13 May 2012 for shortness of breath. She has a history of chronic obstructive pulmonary disease. Chest x-ray was clear. She had mild elevation of her BNP. She has been treated for chronic obstructive pulmonary disease exacerbation and congestive heart failure exacerbation with IV steroids, inhalers, as well as antibiotics and Lasix. On presentation, she was found to have a low sodium of 126. She was noted to have a low sodium on a prior admission earlier this year which had improved after the use of IV fluids. On her second hospital day, serum sodium decreased further to 120, and then today serum sodium is 122.  Additional labs on November 19 showed normal creatinine of 0.53, normal potassium of 4.3, normal glucose of 121. Her potassium has consistently been normal. She does not have diabetes or renal insufficiency. Additional labs on November 19 included an elevated BNP of 916, low serum osmolarity of 272, low urine osmolarity of 99, and a low urine sodium of 27.   The patient believe she has had low sodium going back several years, however, it is not clear if she is a reliable historian. She was not able to recall her medications or her pharmacy or the names of her physicians. I did contact her group home and reviewed her outpatient medications. She has been taking Paxil 40 mg per day as well as lithium 300 mg in the a.m. and 450 mg in the p.m. She has not taken any thiazide diuretic. The patient believes she doesn't drink a lot, she is trying to cut back today after being told to do so. She also believes she urinates fairly frequently, however, was not able to  quantify this for me. She developed enuresis earlier this year. Due to enuresis, she was treated for overactive bladder with Toviaz. That has been quite helpful for her. She denies excessive thirst. At this time her shortness of breath has resolved. She is interested in being discharged.   PAST MEDICAL HISTORY:  1. Bipolar disorder.  2. Chronic obstructive pulmonary disease.  3. Hypertension.  4. Hyperlipidemia.  5. Gastroesophageal reflux disease.   PAST SURGICAL HISTORY: Cholecystectomy.   FAMILY HISTORY: Both parents are deceased. She was not aware of the cause of death of her father, and she believes her mother passed away from complications of emphysema.   SOCIAL HISTORY: The patient resides at a group home called [facility]. She smokes between 8 and 24 cigarettes per day. She denies any alcohol use.   OUTPATIENT MEDICATIONS: Per her group home manager: 1. Paxil 40 mg daily.  2. Lithium 300 mg in a.m. and 450 mg in p.m.  3. Vitamin C 500 mg daily.  4. Toviaz 4 mg daily.  5. Nicotine patch 14 mg.  6. Vitamin D 1000 units daily.  7. Cardizem 240 mg daily.  8. Aspirin 325 mg daily.  9. Prilosec 20 mg daily.  10. Isosorbide mononitrate 30 mg daily.  11. Zyprexa 15 mg daily.  12. Carvedilol 6.25 mg b.i.d. 13. Calcium plus vitamin D 600/400 once daily.  14. Colace 10 mg daily.  15. Klonopin 0.5 mg t.i.d.   ALLERGIES: Biaxin and penicillin.  PHYSICAL EXAMINATION:  GENERAL: No weight loss. No fever.   HEENT: No blurred vision. No sore throat.   NECK: No neck pain. No dysphagia.   CARDIAC: No chest pain, no shortness of breath.   PULMONARY: She has wheeze when she sits up. She denies shortness of breath. She does report cough.   ABDOMEN: She denies abdominal pain. She has good appetite.   EXTREMITIES: She reports left ankle swelling, chronic in nature. She denies right ankle swelling. She denies focal weakness.   NEUROLOGIC: She denies headaches.    HEMATOLOGIC/LYMPHATIC: She denies easy bruisability or recent bleeding.   ENDOCRINE: She denies heat or cold intolerance.   PHYSICAL EXAMINATION:  VITAL SIGNS: Weight 220 pounds, BMI 34.5. Height 66.9 inches, temperature 97.8, pulse 71, respirations 20, blood pressure 135/60, pulse oximetry 95% on 2 liters O2.   GENERAL: Morbidly obese white female in no acute distress.   HEENT: Extraocular movements are intact. Oropharynx is clear. Mucous membranes moist. Dentition is poor.   NECK: Supple. No thyromegaly.   LYMPH: No submandibular or anterior cervical lymphadenopathy.   CARDIAC: Regular rate and rhythm. No audible murmur.   PULMONARY: Clear bilaterally. No wheeze. Normal inspiratory effort.   ABDOMEN: Diffusely soft, nontender.   EXTREMITIES: No edema is present. Motor tone is normal throughout. No elicited resting tremor.   SKIN: No rash or dermatopathy.   LABORATORY, DIAGNOSTIC AND RADIOLOGICAL DATA: Laboratory as per history of present illness. Studies: November 18, PA/lateral chest x-ray showed mild interstitial pulmonary edema. Mild opacities in the posterior costophrenic angles laterally.   ASSESSMENT: This is a 54 year old female with bipolar disorder and chronic obstructive pulmonary disease, admitted with chronic obstructive pulmonary disease/congestive heart failure exacerbation and found to have hyponatremia, likely chronic in nature. Studies thus far have showed low serum osmolality with low urine osmolality and low urine sodium. This pattern suggests primary polydipsia. Other causes of hyponatremia such as adrenal insufficiency or SIADH should present with a more concentrated urine. As her urine is very dilute, this suggests excess fluids overall. Diabetes insipidus does not present with hyponatremia, thus does not fit this pattern of lab findings. Of note, she was taking Paxil as an outpatient which can be associated with SIADH, although again with SIADH we would expect  a more concentrated urine.   RECOMMENDATIONS:  1. I agree with fluid restriction as you are doing.  2. I would aim for slow improvement in her serum sodium level. She is asymptomatic and there is no need to correct this acutely.   3. It would be worthwhile to repeat her urine studies while she is off her furosemide, although I suspect that this was started after the above urine studies were drawn, therefore may not be interfering with our testing.   Please call if there are questions or concerns. Thank you for the kind request for consultation. I will follow along with you.   ____________________________ A. Lavone Orn, MD ams:cbb D: 05/15/2012 14:58:03 ET T: 05/15/2012 16:45:21 ET JOB#: [id number]  cc: A. Lavone Orn, MD, <Dictator> Sherlon Handing MD ELECTRONICALLY SIGNED 05/17/2012 19:46

## 2014-10-12 NOTE — H&P (Signed)
PATIENT NAME:  Natalie Thomas, Natalie Thomas MR#:  [id number] DATE OF BIRTH:  08-Aug-1960  DATE OF ADMISSION:  06/22/2012  PRIMARY CARE PHYSICIAN:  Cletis Athens, MD  REFERRING PHYSICIAN:  Charlesetta Ivory, MD   CHIEF COMPLAINT:  Shortness of breath and wheezing and occasional chest pain.   HISTORY OF PRESENT ILLNESS: The patient is a 54 year old Caucasian female with recurrent admissions to the hospital either complaining of chest pain or COPD exacerbation. She was just discharged from the hospital on December 12th after treatment of acute respiratory failure secondary to chronic obstructive pulmonary disease exacerbation. Unfortunately, this patient has bipolar disorder. She continued to smoke and she presents again here with a history of shortness of breath over the last few days, associated with wheezing. She is also upset asking why they stopped her nitroglycerin sublingual that was given to her by Dr. Lavera Guise.  She indicated that she had brief chest pain a few days ago and another episode yesterday, it was brief as well. However, the reason why she came here was the wheezing and increased shortness of breath. The patient tells me that she continued to smoke, although she cut down from 1 pack to less than a pack a day. She reports no fever, no chills, no sputum production but she has some cough.   REVIEW OF SYSTEMS:  CONSTITUTIONAL:  Denies any fever. No chills but she has mild fatigue. EYES:  No blurring of vision. No double vision.  ENT:  No hearing impairment. No sore throat. No dysphagia.  CARDIOVASCULAR:  Reports shortness of breath and wheezing, did have cough. Reports occasional brief chest pain. No syncope.  RESPIRATORY:  Wheezing, cough and shortness of breath was reported. No sputum production. No hemoptysis.  GASTROINTESTINAL:  No abdominal pain. No vomiting. No diarrhea.  GENITOURINARY:  No dysuria. No frequency of urination.  MUSCULOSKELETAL:  No joint pain or swelling. No muscular pain or  swelling.  INTEGUMENTARY:  No skin rash. No ulcers.  NEUROLOGY:  No focal weakness. No seizure activity. No headache.  PSYCHIATRY:  She has bipolar disorder. She is asking me to consider increasing her Klonopin to 3 times a day.  ENDOCRINE:  No polyuria or polydipsia. No heat or cold intolerance.  HEMATOLOGY:  No easy bruisability. No lymph node enlargement.   PAST MEDICAL HISTORY: 1.  Last admission was December 7, discharged December 12 after treatment of acute respiratory failure secondary to chronic obstructive pulmonary disease exacerbation. 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  2.  Systemic hypertension.  3.  Gastroesophageal reflux disease.  4.  Bipolar disorder.  5.  Diabetes mellitus, type 2.  6.  Hyperlipidemia.   PAST SURGICAL HISTORY:  Cholecystectomy.   FAMILY HISTORY:  Her father had some kind of esophageal growth, then he died. Her mother died from complications of emphysema. She has one aunt who has diabetes mellitus.   SOCIAL HISTORY:  She is divorced, lives in a group home. She is unemployed, lives on disability.   SOCIAL HABITS:  Chronic smoker, usually 1 pack a day since the age of 2, but tells me that she cut down just recently. No history of alcoholism. No history of drug abuse.   MEDICATIONS:  Imdur 30 mg once a day.  Colace 100 mg twice a day.  Zyprexa 15 mg twice a day.  Advair Diskus 150/50 one puff twice a day, DuoNeb 4 times a day as needed.  Depakote 750 mg once a day. She takes 3  tablets of the 250 mg.  Calcium with vitamin D 600/200 twice a day. Paxil 40 mg a day. Omeprazole 20 mg a day.  Vitamin D3 1000 units once a day. Klonopin 0.5 mg in the morning and 1 mg at bedtime. Cardizem 120 mg once a day, slow release. She was on prednisone tapering. Lasix 20 mg twice a day. Levaquin is supposed to be finished by now. Spiriva 1 capsule 1 inhalation once a day. Sodium salt tablets 1 gram 3 times a day.    ALLERGIES: 1.  BIAXIN. 2.  PENICILLIN.  PHYSICAL EXAMINATION: VITAL SIGNS:  Blood pressure 110/58, respiratory rate 22, temperature 98.7, oxygen saturation was 96.  HEAD/NECK:  No pallor. No icterus. No cyanosis.  ENT:  Ear examination revealed normal hearing. No discharge. No lesions. Nasal mucosa was normal. No ulcers. No discharge. Oropharyngeal area showed no exudate. No oral thrush. No ulcers.  EYES:  Normal eyelids and conjunctivae. Pupils about 5 mm, round, equal and reactive to light.  NECK:  Supple. Trachea at midline. No thyromegaly. No cervical lymphadenopathy. No masses.  HEART:  Normal rhythm, normal S1, S2. No S3, S4. No murmur. No gallop. No carotid bruits.  LUNGS:  Revealed slight tachypnea, bilateral expiratory wheezing and slight prolongation of expiratory phase. A few scattered rhonchi. No rales.  ABDOMEN:  Morbidly obese, soft without tenderness. No hepatosplenomegaly. No tenderness. She has a ventral hernia just above the umbilicus. It is easily reducible. Nontender.  MUSCULOSKELETAL:  No joint swelling. No clubbing.  SKIN:  No ulcers. No subcutaneous nodules.  NEUROLOGIC:  Cranial nerves II through XII are intact. No focal motor deficit.  Sense of touch is preserved.  PSYCHIATRIC:  The patient is alert, oriented to place and people. Mood and affect were flat, although she appears a little upset or irritated.  LABORATORY AND DIAGNOSTIC DATA:  Chest x-ray showed no acute cardiopulmonary abnormalities. EKG showed normal sinus rhythm at a rate of 88 per minute. Unremarkable EKG. Serum glucose 136, B-type natriuretic peptide was 158, BUN 3 creatinine 0.5, sodium 136, potassium 3.3, calcium 8.4, albumin 2.8. Normal liver transaminases. Total protein 6.9. CPK 40, troponin less than 0.02. CBC showed white count of 8000, hemoglobin 14, hematocrit 43, platelet count 189. Prothrombin time 13. INR 1.   ASSESSMENT: 1.  Acute exacerbation of chronic obstructive pulmonary disease.   2.  Noncompliance. 3.  Tobacco abuse.  4.  Systemic hypertension.  5.  Diabetes mellitus, type 2.  6.  Hyperlipidemia.  7.  Bipolar disorder.  8.  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  9.  Mild hypokalemia.   PLAN:  We will admit the patient to the medical floor. Intensify treatment with DuoNebs q.4 hours p.r.n. while awake. Change prednisone to IV Solu-Medrol at a small dose, 40 mg q.6 hours given her diabetes. Accu-Cheks and sliding scale and follow up on the blood sugar.  I will resume antibiotic Levaquin orally 500 mg a day. Continue Spiriva once a day and Advair Diskus. Continue psychotic medications as listed above. I will give an additional dose of Klonopin 0.5 mg at noon in addition to her morning and evening dose as she requested. The patient needs to quit smoking but she is not showing a desire to do so. If she does not do that she will keep appearing at the Emergency Room.   Time spent in evaluating this patient and reviewing medical records took more than 55 minutes.     ____________________________ Clovis Pu. Lenore Manner, MD amd:ct  D: 06/22/2012 04:49:00 ET T: 06/23/2012 08:18:39 ET JOB#: [id number]  cc: Clovis Pu. Lenore Manner, MD, <Dictator> Ellin Saba MD ELECTRONICALLY SIGNED 06/24/2012 6:29

## 2014-10-12 NOTE — Discharge Summary (Signed)
PATIENT NAME:  Natalie Thomas, GRADILLAS MR#:  [id number] DATE OF BIRTH:  1960-11-06  DATE OF ADMISSION:  05/13/2012 DATE OF DISCHARGE:  05/16/2012  PRIMARY CARE PHYSICIAN: Cletis Athens, MD  DISCHARGE DIAGNOSES:  1. Acute chronic obstructive pulmonary disease exacerbation.  2. Hyponatremia. 3. Bipolar disorder, on lithium.  4. Hypertension.  5. Tobacco abuse.   IMAGING STUDIES: Chest x-ray, PA and lateral, showed no acute infiltrates.   ADMITTING HISTORY AND PHYSICAL: Please see detailed history and physical dictated on 05/13/2012. In brief, this is a 54 year old female patient with history of chronic obstructive pulmonary disease, hypertension, and diabetes who presented to the Emergency Room complaining of shortness of breath. The patient was found to be hyponatremic, wheezing and started on steroids and nebulizers in the ER, did not respond, and was admitted to the hospitalist service.   HOSPITAL COURSE:  1. Acute chronic obstructive pulmonary disease exacerbation. The patient was started on Levaquin and IV steroids and later transitioned to prednisone along with scheduled nebulizers with which she slowly improved. The patient was wheezing mildly at the time of discharge, but felt back to her baseline and was discharged back to her assisted living facility. The patient saturations were 94% on room air on ambulation and did not need any home oxygen. The patient has been started on Advair at the time of discharge along with DuoNeb nebulizer and prednisone taper over 12 days.  2. Hyponatremia. The patient was found to have hyponatremia secondary to lithium. Her lithium was changed to Depakote by Dr. Weber Cooks of psychiatry. The patient was also seen by Dr. Gabriel Carina who suggested fluid restriction with which her sodium is slowly improving and presently is at 129, up from 120.  3. The patient's hypertension and diabetes were well controlled during the hospital stay.   On the day of discharge, the patient's  temperature is 98.1, pulse 61, and saturating 94% on room air on ambulation with lung examination showing good air entry with minimal wheezing and is being discharged back to her assisted-living facility in stable fair condition.   DISCHARGE MEDICATIONS:  1. Prednisone 60 mg once a day tapered over 12 days.  2. Clonazepam 0.5 mg oral once a day.  3. Prilosec 20 mg oral once a day.  4. Imdur  30 mg oral once a day.  5. Colace 100 mg oral twice a day. 6. Zyprexa 10 mg oral twice a day. 7. Cardizem LA 240 mg oral once a day.  8. Nicotine patch 21 mg topical once a day.  9. Coreg 6.25 mg oral twice a day. 10. Tylenol 650 mg oral every four hours as needed for pain or fever.  11. Xopenex inhaled every four hours as needed.  12. Levaquin 500 mg oral once a day for four days.  13. Advair Diskus 100/50 one puff inhaled twice a day.  14. Nebulizer DuoNeb 3 mL four times daily as needed for shortness of breath.  15. Depakote ER 750 mg oral once a day.   NOTE: Lithium is stopped.   DISCHARGE INSTRUCTIONS: The patient will be on a low fat, low cholesterol, diabetic diet.  Activity as tolerated. She is to call her doctor or return to the Emergency Room if she has any worsening shortness of breath.   TIME SPENT: Time spent on the day of discharge in coordinating care and discharge activity was 40 minutes. ____________________________ Leia Alf Mounir Skipper, MD srs:slb D: 05/16/2012 14:40:29 ET     T: 05/16/2012 14:52:45 ET  JOB#: [id number] cc: Cheris Tweten R. Amberia Bayless, MD, <Dictator> Cletis Athens, MD Neita Carp MD ELECTRONICALLY SIGNED 05/17/2012 16:17

## 2014-10-12 NOTE — Consult Note (Signed)
PATIENT NAME:  Natalie Thomas, Natalie Thomas MR#:  [id number] DATE OF BIRTH:  [birth year]  DATE OF CONSULTATION:  06/04/2012  REFERRING PHYSICIAN:  Munsoor Lateef, MD CONSULTING PHYSICIAN:  A. Lavone Orn, MD  CHIEF COMPLAINT: Hyponatremia.   HISTORY OF PRESENT ILLNESS: This is a 54 year old female who was admitted on 05/31/2012 for shortness of breath attributed to chronic obstructive pulmonary disease exacerbation. She was found to have a low sodium and over the course of hospitalization, sodium has essentially been in the range of 123 to 130. There has been some gradual improvement with various therapies including free water restriction, Conivaptan IV infusion, and salt tablets.   The patient is known to me from a prior hospitalization last month. During that time, she was also noted to have hyponatremia. Additionally, she was admitted earlier this year also with hyponatremia. Sodium seems to be consistently in the mid 120s. Additional labs had shown she is hypoosmolar and urine studies have shown a low urine osmolarity and low urine sodium. The patient has a history of bipolar disorder treated with lithium. She also takes long-standing selective serotonin reuptake inhibitor therapy, most recently with Paxil. Due to chronic obstructive pulmonary disease exacerbation, she has received multiple courses of high-dose steroids. She is currently receiving prednisone 5 mg daily. The patient has no acute complaints. She specifically denies lightheadedness on standing. She denies any nausea or vomiting. Appetite is good. She does complain of increased thirst and is having a difficult time with the free water restriction.   PAST MEDICAL HISTORY:  1. Bipolar disorder.  2. Chronic obstructive pulmonary disease.  3. Congestive heart failure.  4. Tobacco dependence.  5. Hypertension.  6. Hyperlipidemia.  7. Gastroesophageal reflux disease.   PAST SURGICAL HISTORY: Cholecystectomy.   FAMILY HISTORY: Positive for  chronic obstructive pulmonary disease.   SOCIAL HISTORY: The patient lives at a group home. She smokes 8 to 24 cigarettes per day. No alcohol use.   CURRENT INPATIENT MEDICATIONS:  1. Prednisone 5 mg daily.  2. Clonazepam 1 mg at bedtime.  3. Docusate 100 mg b.i.d.  4. Olanzapine 15 mg b.i.d.  5. Prilosec 20 mg daily.  6. Sodium chloride tablet 1 gram twice a day.  7. NovoLog insulin sliding scale.  8. Conivaptan drip 20 mg daily.  9. Albuterol-ipratropium nebulizer.   ALLERGIES: Biaxin and penicillin.   REVIEW OF SYSTEMS: GENERAL: No weight loss. No fever. HEENT: No blurred vision. No sore throat. NECK: No neck pain. No dysphasia. CARDIAC: No chest pain. Shortness of breath is fairly stable at this time. Denies cough. ABDOMEN: Denies abdominal pain. Appetite is good. GENITOURINARY: Denies dysuria. EXTREMITIES: Denies leg swelling. MUSCULOSKELETAL: Denies weakness. NEUROLOGIC: No recent falls. No lightheadedness on standing. HEMATOLOGIC: No easy bruisability or recent bleeding. ENDOCRINE: No heat or cold intolerance.   PHYSICAL EXAMINATION:  VITAL SIGNS: Height 67.9 inches, weight 231 pounds, BMI 35.3, temperature 98.2, pulse 74, respirations 20, blood pressure 148/76, pulse oximetry 91% on room air.   GENERAL: This is an obese white female in no acute distress.   HEENT: No proptosis or lid lag. Oropharynx is clear. Mucous membranes moist.   NECK: Supple. No thyromegaly.   CARDIAC: Regular rate and rhythm without murmur.   PULMONARY: Diffuse wheeze without rhonchi.   ABDOMEN: Diffusely soft, nondistended.   EXTREMITIES: No edema is present.   NEUROLOGIC: No tremor of outstretched hands.   PSYCHIATRIC: Alert and oriented, calm and cooperative.   LABORATORY DATA: 05/30/12: sodium 130, serum osmolarity 261. 06/01/12: serum sodium 123,  serum osmolarity 255, creatinine 0.51. Urine sodium 7, urine osmolarity 163. Urine creatinine 15.5. 12/09: Hemoglobin A1c 6.3%. TSH 0.4 to 8. Urine  osmolarity 131. Urine chloride less than 10. 06/04/12: Sodium 131, potassium 4.4, chloride 93, calcium 9.1, WBC 7.9.   RADIOLOGY: CT chest 06/01/2012 showed no mediastinal hilar or axillary lymphadenopathy. Mild basilar opacities likely due to atelectasis. Airways are otherwise patent. Multiple chronic appearing anterior wedge compression fractures in thoracic lumbar spine.   ASSESSMENT:  1. Chronic asymptomatic euvolemic hyposmolar hyponatremia. 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 She has not responded very quickly to free water restriction. In addition, she has had evidence of polyuria, suggesting other etiologies to be considered such as SIADH. Serum sodium now seems to be improving with salt tablets, conivaptan, and free water restriction. Causes of SIADH could include her prior use of selective serotonin reuptake inhibitor. Additionally, she may have a component of adrenal insufficiency given history of recurrent use of glucocorticoids. She has osteoporosis with multiple spinal compression fractures, possibly induced by steroids. Therefore, has an increased likelihood of adrenal insufficiency. Testing for adrenal insufficiency at this time is not warranted given ongoing use of prednisone.  2. Abnormal thyroid function tests. She has a mildly low TSH, although this is not likely contributing to her hyponatremia.   RECOMMENDATIONS:  1. Once steroids have been stopped, it would be worthwhile after 24 to 48 hours to obtain a morning cortisol to assess for adrenal insufficiency.  2. Obtain thyroid labs in the morning to further assess her low TSH. This may be sick euthyroid, rather than true hyperthyroidism.  3. Once serum sodium has normalized, I suspect we will titrate off the conivaptan. She likely should continue her salt tablets and free water restriction to likely treat underlying component of SIADH and primary polydipsia. Given the fact that she is relatively  asymptomatic and this is so chronic in nature, she may not benefit from strict measures to control her serum sodium in the normal range and rather a reasonable goal would be to keep sodium above 125.   Thank you for the kind request for consultation. I will not be available to see the patient tomorrow, however, I will be available again on 06/06/2012 if she remains hospitalized and I will reassess at that time.    ____________________________ A. Lavone Orn, MD ams:ap D: 06/04/2012 22:03:30 ET T: 06/05/2012 08:12:03 ET JOB#: [id number]  cc: A. Lavone Orn, MD, <Dictator> Sherlon Handing MD ELECTRONICALLY SIGNED 06/09/2012 13:05

## 2014-10-12 NOTE — Consult Note (Signed)
Brief Consult Note: Diagnosis: bipolar disorder.   Patient was seen by consultant.   Consult note dictated.   Recommend further assessment or treatment.   Orders entered.   Comments: Psychiatry: Patient seen and chart reviewed. Patient with history of bipolar disorder currently on Li as part of her treatment. Running persistant low Na. Lithium could possibly be a causitive agent. From pt history Nicoletta Dress was just added this spring at the same time as zyprexa. She is not very good at history of other psych meds. I have called [name], her outpt provider and asked to speak to someone, preferably her psychiatrist. I'm waiting for a call back. Meanwhile I will go ahead and stop her lithium. Will make her klonopin standing at her request. Continue zyprexa. Will follow up.  Electronic Signatures: Gonzella Lex (MD)  (Signed 21-Nov-13 15:05)  Authored: Brief Consult Note   Last Updated: 21-Nov-13 15:05 by Gonzella Lex (MD)

## 2014-10-12 NOTE — Consult Note (Signed)
PATIENT NAME:  Natalie Thomas, DAFOE MR#:  [id number] DATE OF BIRTH:  07/19/1960  DATE OF CONSULTATION:  05/15/2012  REFERRING PHYSICIAN:   CONSULTING PHYSICIAN:  Gonzella Lex, MD  IDENTIFYING INFORMATION AND REASON FOR CONSULT: This is a 54 year old woman with a history of bipolar disorder who is currently in the hospital for shortness of breath. She has developed hyponatremia during her hospital stay or rather it has been discovered. Consultation is for consideration of her lithium treatment in the context of her hyponatremia.   HISTORY OF PRESENT ILLNESS: Information obtained from the chart and from the patient. This 54 year old woman tells me that currently her mood is feeling okay. She denies feeling depressed and denies feeling manic. She says she has not been having any auditory or visual hallucinations, not been having any paranoia, not been having any delusions or bizarre experiences. She says she has been sleeping adequately and has not felt agitated. She feels like her current medication has been very effective in keeping her mood stable now for many months. She came into the hospital because of shortness of breath and it has subsequently been found that she has persistent hyponatremia. This is being worked up but one possible causative agent would be her treatment with lithium carbonate. Patient tells me she had been taking lithium since early this spring when she was at [facility]. The lithium was started about the same time as Zyprexa that she is currently taking. Prior to that she says she had been taking Depakote. She cannot remember any information to give me a real clue about whether the Depakote had been working or not working or had had some side effects or what the problem was. Prior to that she cannot recall anything very useful about her previous psychiatric medication history.   PAST PSYCHIATRIC HISTORY: Patient says she started having mental health problems when she  had a "nervous breakdown" in her 8s. Subsequent to that she has had multiple hospitalizations. Multiple episodes of psychosis. She does not describe them very well. She says she has had one suicide attempt years ago but nothing recently. Denies being physically violent to other people.   PAST MEDICAL HISTORY: Patient has chronic obstructive pulmonary disease. She is obese, has a history of hypertension, hypercholesterolemia, gastroesophageal reflux, diabetes.   SOCIAL HISTORY: Lives in a group home. Likes it. Her brother is still somewhat involved in her life. Parents are deceased and she has no children.   REVIEW OF SYSTEMS: Currently she complains just of mild shortness of breath. No acute psychiatric illness.   MENTAL STATUS EXAMINATION: Dressed in a hospital gown. Wide awake. Cooperative with the interview. Good eye contact. Normal psychomotor activity. Speech normal in rate, tone, and volume. Affect slightly blunted. Mood stated as being okay. Thoughts are lucid with no loosening of associations. Some slowness. No evidence of delusions. Denied auditory or visual hallucinations. Denied suicidal or homicidal ideation. Judgment and insight pretty good. Baseline intelligence probably average. Recent memory okay. Longer term memory impaired possibly as a result of her long-term psychosis.   ASSESSMENT: Patient is currently taking 300 mg of lithium in the morning and 450 at night. I am not sure if that is the correct dose since it is different from what was listed on admission. She had a lithium level of 1.1 when she came into the hospital. I have only a little information to go on about how important the lithium is to maintain her stability, however, I do think that it  makes sense that it may be a culprit to causing her hyponatremia.   TREATMENT PLAN: I have contacted her outpatient psychiatrist and am seeking more information and details about her treatment. Meanwhile, I have gone ahead and  discontinued her lithium. Continue the Zyprexa at the current dose. I have made her Klonopin a standing dose. Will continue to follow.   DIAGNOSIS PRINCIPLE AND PRIMARY:  AXIS I: Bipolar disorder type I, currently in remission.   AXIS II: Deferred.   AXIS III:  1. Hyponatremia. 2. Chronic obstructive pulmonary disease. 3. Multiple medical problems as detailed above.   AXIS IV: Moderate from chronic burden of illness.   AXIS V: Functioning at time of evaluation 56. ____________________________ Gonzella Lex, MD jtc:cms D: 05/15/2012 15:14:26 ET T: 05/15/2012 17:06:03 ET  JOB#: [id number] Gonzella Lex MD ELECTRONICALLY SIGNED 05/16/2012 13:29

## 2014-10-12 NOTE — Discharge Summary (Signed)
PATIENT NAME:  Natalie Thomas, Natalie Thomas MR#:  [id number] DATE OF BIRTH:  1961-06-22  DATE OF ADMISSION:  05/31/2012 DATE OF DISCHARGE:  06/05/2012  ADMITTING PHYSICIAN: Dr. Margaretmary Thomas DISCHARGING PHYSICIAN: Dr. Gladstone Thomas   PRIMARY CARE PHYSICIAN: Dr. Cletis Thomas  CONSULTATIONS IN THE HOSPITAL:   1. Nephrology consultation by Dr. Holley Thomas.  2. Endocrinology consultation by Dr. Gabriel Thomas.  DISCHARGE DIAGNOSES:  1. Acute respiratory failure secondary to chronic obstructive pulmonary disease exacerbation.  2. Acute on chronic hyponatremia, sodium at the time of discharge is 134. She did require conivaptan drip while in the hospital.  3. Polydipsia.  4. SIADH.  5. Bradycardia.  6. Hypertension.  7. Tobacco use disorder.  8. Gastroesophageal reflux disease.  9. Diabetes mellitus.  10. Hyperlipidemia.  11. Bipolar disorder.   DISCHARGE HOME MEDICATIONS:  1. Imdur 30 mg p.o. daily.  2. Colace 100 mg p.o. b.i.d.  3. Zyprexa 15 mg p.o. b.i.d.  4. Advair 100/50 1 puff twice a day.  5. DuoNebs 3 mL 4 times a day as needed.  6. Depakote 750 mg Extended Release once a day at bedtime.  7. Calcium with vitamin D 600 mg/200 international units p.o. twice a day.  8. Paxil 40 mg p.o. daily.  9. Omeprazole 20 mg p.o. daily.  10. Vitamin D3 1000 international units p.o. daily.  11. Klonopin 1 mg p.o. at bedtime.  12. Klonopin 0.5 mg in the morning.  13. Cardizem 120 mg p.o. daily.  14. Prednisone taper 15 mg the first day followed by 10 mg, followed by 5 mg for four days, then stop.  15. Lasix 20 mg p.o. b.i.d.  16. Levaquin 750 mg once a day for two more days.  17. Nicotine inhaler every four hours as needed for nicotine craving.  18. Nicotine patch 21 mg transdermal daily.  19. Spiriva 1 capsule inhalation daily.  20. Salt? tablets 1 gram p.o. t.i.d.   DISCHARGE DIET:  Regular diet, fluid restriction to 1200 mL per day.   DISCHARGE ACTIVITY: As tolerated.    FOLLOWUP INSTRUCTIONS:   1. Primary care physician followup in one week.  2. Nephrology followup with Dr. Juleen Thomas in 1 to 2 days.   LABS AND IMAGING STUDIES: Sodium at the time of discharge is 134, potassium 4.3, chloride 95, bicarbonate 33, BUN 10, creatinine 0.6, glucose 19, calcium 8.6. TSH 1.23. Free T4 is 1.23 as well.  CT of the abdomen and pelvis showing nephrolithiasis unchanged and right adrenal scarring. No evidence of obstructing urethral stone is present. WBC 12.4, hemoglobin 14.9, hematocrit 45.9, platelet count 166. Sputum cultures were normal. Chest x-ray revealing mild left basilar opacities, likely atelectasis.  ALT 13, AST 17, alkaline phosphatase 98, total bili 0.3, albumin 3.2.   BRIEF HOSPITAL COURSE: Natalie Thomas is a 54 year old Caucasian female with past medical history significant for bipolar disorder, chronic obstructive pulmonary disease, hypertension, hyperlipemia, and ongoing smoking who presents to the hospital secondary to worsening shortness of breath. She does have a history of known hyponatremia and is very noncompliant with fluid restriction.  1. Acute respiratory failure secondary to chronic obstructive pulmonary disease exacerbation and mild bronchitis.  She was started on IV Solu-Medrol and tapered to oral steroids at this time. She is on DuoNebs, Spiriva, and also Advair, doing well. She is on Levaquin for her bronchitis which she will finish up as an outpatient. 2. Acute on chronic hyponatremia, known history of chronic hyponatremia with sodium as low as 119 during this admission. Not improved  just with Lasix, salt tabs, and fluid restriction. She had to be started on conivaptan.  Conivaptan was just stopped today. The patient is very insistent on going home. Repeat sodium is 134 but the patient is also very noncompliant with fluid intake. Even in the hospital, once her fluids were restricted to 1200 mL she finished her 1200 mL within the first five hours of waking up and has been requesting  more and more fluids. It was strongly reinforced at the time of discharge that she cannot do this. Possible cause of  hyponatremia is polydipsia and also SIADH probably.  She was also seen by an endocrinologist. Random cortisol cannot be ordered now because she is on steroids and has been chronically on steroids on-and-off.  She will follow up as an outpatient if needed with endocrinology. CT of the abdomen did not show any of masses. Nephrology and endocrinology consults were appreciated while in the hospital. She will have outpatient nephrology followup.  3. Hypertension. Blood pressure is stable. She is on diltiazem which she will continue as an outpatient and also on Imdur. Because of her bradycardia Coreg has been stopped at this time.  4. Smoking. Strongly counseled. She is on a nicotine patch and Nicotrol inhaler as well.  5. Depression and mania with bipolar disorder. Her home psychiatric medications are being continued. Her course has been otherwise uneventful in the hospital.   DISCHARGE CONDITION: Stable.   DISCHARGE DISPOSITION: To group home.   TIME SPENT ON DISCHARGE: 45 minutes.   ____________________________ Natalie Lighter, MD rk:bjt D: 06/05/2012 14:43:43 ET T: 06/06/2012 11:16:41 ET JOB#: [id number]  cc: Natalie Lighter, MD, <Dictator> Natalie Athens, MD Natalie Lighter MD ELECTRONICALLY SIGNED 06/10/2012 14:59

## 2014-10-12 NOTE — Consult Note (Signed)
Details:    - Psychiatry: Patient seen. Chart reviewed. Since yesterday I spoke with her group home and the [facility]. We haven't been able to get state hosp records yet. Today she is alert, calm, affect stable, no sign of psychosis, no agitation. Her Na is up today and I would suggest continuing off the lithium. Discussed options with the patient esp given she is being discharged. We agree to start Depakote ER 750mg  at night as a replacement in addition to continuing her Zyprexa at current dose. Patient agrees to this. She has nml LFTs and doesn't remember any bad reaction to depakote. She knows signs and symptoms of bipolar and is counceled to stay on a regular schedule, sleep well, report psychotic symptoms or mood shifts to staff and should follow up with the [facility] within 2-3 weeks to check condition and depakote level. Patient agrees to all this.   Electronic Signatures: Gonzella Lex (MD)  (Signed [phone] 12:14)  Authored: Details   Last Updated: 22-Nov-13 12:14 by Gonzella Lex (MD)

## 2014-10-12 NOTE — H&P (Signed)
PATIENT NAME:  Natalie Thomas, Natalie Thomas MR#:  [id number] DATE OF BIRTH:  [birth year]  DATE OF ADMISSION:  05/31/2012  REFERRING PHYSICIAN: Dr. Beather Arbour.   PRIMARY CARE PHYSICIAN:  Dr Lavera Guise.  PULMONOLOGIST:  Dr Mortimer Fries  CHIEF COMPLAINT:  Shortness of breath.  HISTORY OF PRESENT ILLNESS: The patient is a 54 year old Caucasian female who is a resident of group home with known past medical history of chronic obstructive pulmonary disease not on home oxygen, still smoking 1 pack a day, hypertension, hyperlipidemia, diabetes mellitus, which is diet controlled, manic depressive disorder, was on lithium which was discontinued in view of hyponatremic during previous admission, and hyperlipidemia. Was sent over to the ER with a chief complaint of worsening of shortness of breath. The patient was just recently admitted to the hospital on 11/19 2013 with a similar complaint of shortness of breath and was treated for acute exacerbation of chronic obstructive pulmonary disease, and the patient was discharged to group home on 05/16/2012. The patient is reporting that she was not completely better at the time of discharge and has been feeling short of breath since she got discharged on 11/22. Her shortness of breath is slowly getting worse and for the past 3 to 4 days it has been worse associated with productive cough. The patient is bringing up phlegm which is yellowish in color. Denies any blood in her sputum. Denies any chest pain or fever. She still is smoking and smokes 1 pack a day. She is planning to quit and working on it. The patient was recommended to follow-up with pulmonologist as an outpatient for pulmonary function testing  the previous admission but patient's appointment is some time in the next 10-15 days and she could not recall her appointment date.   When the patient came in to the ER she was hypoxemic with a pulse oximetry of 83% and was diffusely wheezing. The patient was given Solu-Medrol 125 mg IV and  nebulizer treatments as well. Chest x-ray has revealed no acute infiltrates or no acute findings. Eventually on 2 liters of oxygen the patient's pulse oximetry was at 94%. During my examination she is not using any accessory muscles and speaking in full sentences, still feeling tired, but shortness of breath is significantly improved. Hospitalist team is called to admit the patient for acute exacerbation of chronic obstructive pulmonary disease. The patient denies any complaints of chest pain, abdominal pain, nausea, vomiting, diarrhea, hematemesis or hematochezia. Denies any other complaints. During my examination patient is saturating 93% to 94% on oxygen by nasal cannula.   PAST MEDICAL HISTORY: Hypertension, hyperlipidemia, chronic obstructive pulmonary disease, nicotine dependence, GERD, diabetes mellitus diet controlled, manic depressive illness.   PAST SURGICAL HISTORY:  Cholecystectomy.   ALLERGIES: The patient is allergic to Biaxin and penicillin.   PSYCHOSOCIAL HISTORY: Resides in a group home. She is disabled e in view of manic depressive illness. Still smokes 1 pack a day, started at age 70. Denies any alcohol or illicit drug usage.   FAMILY HISTORY: Mother deceased with emphysema at age 19, diabetes runs in her family.   HOME MEDICATIONS: Zyprexa 15 mg 1 tablet twice a day. Vitamin D 1000 international units 1 tablet once a day. Toviaz 4 mg 1 tablet once a day. Paxil 40 mg once a day. Omeprazole 20 mg 1 capsule once a day. Imdur 30 mg 1 tablet once a day. DuoNeb treatments 3 mL inhalation 4 times a day. Depakote extended release 250 mg 3 tablets once a day. Colace 100 mg  twice a day, clonidine 1.5 mg once a day. Coreg 6.25 mg twice a day. Cardizem LA 240 mg 1 tablet once a day. Calcium with vitamin D 1 tablet twice a day. Advair Diskus 1 puff inhalation twice a day.   REVIEW OF SYSTEMS:  CONSTITUTIONAL: Patient denies any fever, fatigue, weakness, denies any pain, weight loss or weight  gain.   EYES: Denies any blurry vision, cataracts, inflammation, glaucoma.   ENT: Denies tinnitus, ear pain, discharge, snoring, postnasal drip, or difficulty in swallowing.   RESPIRATORY: Complaining of cough, is productive, complaining of wheezing diffusely. Denies any hemoptysis. Positive shortness of breath. Positive chronic obstructive pulmonary disease. Denies any painful respirations.   CARDIOVASCULAR: Denies any chest pain, orthopnea, edema, varicose veins, or palpitations.   GI: Denies nausea, vomiting, diarrhea, abdominal pain, hematemesis, rectal bleeding, constipation, change in bowel habits.   GENITOURINARY: Denies dysuria, hematuria, or renal calculus, frequency, incontinence.  GYN:  Denies any breast mass or tenderness or discharge.   ENDOCRINE: Denies polyuria, polyphagia, polydipsia, nocturia, thyroid problems, increased sweating.   HEMATOLOGIC/LYMPHATIC: Denies anemia, easy bruising,  swollen glands.   INTEGUMENTARY: Denies acne, rash, lesions or change in mole or head skin.  MUSCULOSKELETAL: Denies any pain in the neck, back, shoulder. Denies any swelling, gout. Denies any cramps   NEUROLOGIC: Denies any numbness, vertigo, ataxia, seizures, dementia, headache.   PSYCH: Denies anxiety, insomnia, nervousness, bipolar disorder.   PHYSICAL EXAMINATION:   VITAL SIGNS: Temperature 98.9, pulse 67, respiratory rate 20, blood pressure 142/65, pulse oximetry initially 84%, but eventually 94% on oxygen via nasal cannula.   GENERAL APPEARANCE: Not in acute distress. Well built and well nourished.   HEENT: Normocephalic, atraumatic. Pupils are equally reacting to light and accommodation. No scleral icterus. No conjunctival injection. Extraocular movements are intact. No postnasal drip. No nasal congestion. Moist mucous membranes. Oropharynx. No plaques. No abscesses  noticed. Dentition is intact.   NECK: Supple. No JVD. No thyromegaly. No lymphadenopathy.   LUNGS: Diffuse  wheezing is present. No accessory muscle. No chest wall tenderness.   CARDIAC: S1, S2 normal. Regular rate and rhythm.  Chronic pedal edema, which is 1+, peripheral pulses, dorsalis pedis and posterior tibialis are 2+.   GI: Soft. Bowel sounds are positive in all four quadrants. Nontender, nondistended. No masses felt.  NEUROLOGIC:  Alert and oriented x3. Cranial nerves II through XII are grossly intact. Sensory is intact. Reflexes are 2+.  EXTREMITIES: No cyanosis. No clubbing. Chronic edema is present which is nonpitting.   SKIN: No lesions. No rashes. No cyanosis. Warm to touch. Intact skin turgor.  MUSCULOSKELETAL: No swelling  or erythema of the joints. Range of motion is intact.   PSYCH: Normal mood and affect.   LABS AND IMAGING STUDIES: Chest x-ray intact. A 12-lead EKG normal sinus rhythm. Normal PR and QRS intervals,  Labs: Glucose 160, BUN 5, creatinine 0.4, sodium 130, potassium 4.3, chloride 94, CO2 20, GFR greater than 60, anion gap 6, serum osmolality 69, calcium 8.6, total protein 6.8, albumin 3.2, total bilirubin 0.3, alkaline phosphatase 98, ALT 13, AST 17, troponin T less than 0.02. White count to 12.1, hemoglobin 15.1, hematocrit 46.7, platelet count 154,000, MCV 91, RDW 17.4.   ASSESSMENT AND PLAN: 54 year old female was in the ER with a chief complaint of worsening of shortness of breath with productive cough. Will be admitted with the following assessment and plan.  1. Shortness of breath from acute exacerbation of chronic obstructive pulmonary disease. Plan:  Admit to tele bed.  Will continue Solu-Medrol 80 mg IV q.6 hours. DuoNeb nebulizer treatments q.6 hours and, Albuterol ever four hours as needed. Levofloxacin 750 mg IV q.24 hours, pharmacy to dose. Continue oxygen via nasal cannula.  2. Nicotine dependence. The patient was counseled to quit smoking. The patient is supposed to follow with pulmonology as an outpatient as scheduled for pulmonary function tests as  recommended during previous admission. Will start  nicotine inhaler kit. Counseling was provided. 3. Hyponatremia. She is significantly improved since previous admission. The hyponatremia was assumed from Lithium which was discontinue during previous admission.. Will continue Depakote.  4. History of hypertension, blood pressure is stable, continue her home medication Coreg.  5. Diabetes mellitus, which is diet controlled. Will continue diabetic diet, and the patient will be on sliding scale for steroid-induced hyperglycemia during hospital course.  6. Gastroesophageal reflux disease. Will continue proton pump inhibitor. Manic-depressive illness, which  is chronic in nature. Will continue Depakote which was started  during the previous admission.   THE PATIENT IS FULL CODE.   The diagnosis and plan of care was discussed in detail with the patient. She is aware of the plan.   Total time spent on admission including reviewing old medical records is  60 minutes   ____________________________ Nicholes Mango, MD ag:ljs D: 05/31/2012 01:53:00 ET T: 05/31/2012 08:31:40 ET JOB#: [id number]  cc: Nicholes Mango, MD, <Dictator> Cletis Athens, MD Mariane Duval, MD Nicholes Mango MD ELECTRONICALLY SIGNED 06/01/2012 0:39

## 2014-10-15 NOTE — Discharge Summary (Signed)
PATIENT NAME:  Natalie Thomas, Natalie Thomas MR#:  [id number] DATE OF BIRTH:  Dec 26, 1960  DATE OF ADMISSION:  06/22/2012 DATE OF DISCHARGE:  06/23/2012  PRIMARY CARE PHYSICIAN: Cletis Athens, MD  FINAL DIAGNOSES:  1.  Acute respiratory failure.  2.  Chronic obstructive pulmonary disease exacerbation.  3.  Tobacco abuse.  4.  Bipolar disorder.  5.  Chronic hyponatremia.  6.  Weakness.   MEDICATIONS ON DISCHARGE: Include Imdur 30 mg daily, Colace 100 mg twice a day,  Zyprexa 15 mg twice a day, Depakote ER 250 mg 3 tablets at bedtime, calcium and vitamin D 1 tablet twice a day, omeprazole 20 mg daily, vitamin D 3000 international units daily, clonazepam 0.5 mg 2 tablets at bedtime, Cardizem LA 120 mg daily, furosemide 20 mg twice a day, sodium chloride 1000 mg 3 times a day, clonazepam 0.5 mg in the a.m.; prednisone 5 mg 5 tabs day 1, 4 tabs day 2, 3 tabs day 3, 2 tabs day 4 and 5, 1 tab day 6 and 7; Advair Diskus 100/50, 1 inhalation twice a day; Spiriva 1 inhalation daily, Levaquin 500 mg daily for 8 days, nicotine patch 21 mg/24-hour film extended-release 1 patch daily, Paxil 40 mg daily, albuterol/ipratropium CFC-free 100 mcg/20 mcg per inhalation 1 puff 4 times a day.   DISCHARGE FOLLOWUP: Home health PT and nurse. Follow up with Dr. Lavera Guise in 1 week.   DIET: Low sodium, regular consistency.   ACTIVITY: As tolerated.   REASON FOR ADMISSION: The patient was admitted 06/22/2012. Came in with shortness of breath, wheezing, occasional chest pain. She was admitted with acute COPD exacerbation, noncompliance, tobacco abuse, hypertension. She was admitted and started on IV Solu-Medrol, antibiotic. The patient was going to sign out [place] at that time, but then had no ride and wanted to stay.   LABORATORY AND RADIOLOGICAL DATA DURING THE HOSPITAL COURSE: EKG showed a normal sinus rhythm, no acute ST-T wave changes. BNP 158. Glucose 136, BUN 3, creatinine 0.58, sodium 136, potassium 3.3,  chloride 99, CO2 of 29, calcium 8.4. Liver function tests: Normal range. White blood cell count 8.6, hemoglobin and hematocrit 14.4 and 43.6, platelet count of 189. INR 1.0. Troponin negative. Chest x-ray: Shallow inspiration, no focal regions of consolidation. Urinalysis: Trace leukocyte esterase.   HOSPITAL COURSE PER PROBLEM LIST:  1.  For the patient's acute respiratory failure, the patient's pulse ox on room air was 90%. When she came in, pulse ox 91% on 2 liters. This had improved. The patient is off oxygen supplementation.  2.  COPD exacerbation: The patient was started on IV Solu-Medrol, given nebulizers, Advair and Spiriva. She was switched over to prednisone taper upon discharge and given a course of Levaquin. I think the major problem here is the patient continues to smoke.  3.  Tobacco abuse: Smoking cessation counseling done, 3 minutes by me. Nicotine patch applied. 4.  Bipolar disorder: On numerous medications. No changes in medications were made by me.  5.  Chronic hyponatremia: She is on salt pills, sodium chloride and Lasix to help out with this.  6.  Weakness: Home health PT done.   TIME SPENT ON DISCHARGE: 35 minutes.    ____________________________ Tana Conch. Leslye Peer, MD rjw:jm D: 06/24/2012 16:44:26 ET T: 06/24/2012 18:47:30 ET JOB#: [id number]  cc: Tana Conch. Leslye Peer, MD, <Dictator> Marisue Brooklyn MD ELECTRONICALLY SIGNED 06/26/2012 19:08

## 2014-10-17 NOTE — H&P (Signed)
PATIENT NAME:  Natalie, Thomas MR#:  [id number] DATE OF BIRTH:  16-Dec-1960  DATE OF ADMISSION:  11/19/2011  PRIMARY CARE PHYSICIAN: Cletis Athens, MD   CHIEF COMPLAINT: Chest pain.   HISTORY OF PRESENT ILLNESS: Natalie Thomas is a 54 year old Caucasian female with history of systemic hypertension, hypercholesterolemia, and COPD. She also indicates she has history of coronary artery disease but denies prior history of heart attack. She is unsure of any cardiac work-up and does not give a detailed history. She states she takes aspirin every day and may need sublingual nitroglycerin p.r.n. She was in her usual state of health until yesterday when she had left-sided chest pain located around the left breast area and above it and also left axillary area. The pain was described as sharp pain. The severity was 8 on a scale of 10. She reports there was mild shortness of breath with that. The pain duration was 24 hours. She took nitroglycerin yesterday and pain eased off and improved. However, it came back again. Today she decided to call the EMT who gave her four tablets of aspirin and she received nitroglycerin which helped her chest pain. By the time she came here she was chest pain free. She denies any palpitations. No syncope. No vomiting. No sweating.   REVIEW OF SYSTEMS: CONSTITUTIONAL: Denies any fever. No chills. No night sweats. No fatigue. EYES: No blurring of vision. No double vision. ENT: No hearing impairment. No sore throat. No dysphagia. CARDIOVASCULAR: Reports chest pain and some mild shortness of breath. No edema. No syncope. RESPIRATORY: No cough. No sputum production. She has few wheezing and she continues to smoke. No hemoptysis. GASTROINTESTINAL: No abdominal pain. No vomiting. No diarrhea. GENITOURINARY: No dysuria. No frequency of urination. MUSCULOSKELETAL: No joint pain or swelling. No muscular pain or swelling. INTEGUMENTARY: No skin rash. No ulcers. NEUROLOGY: No focal weakness. No seizure  activity. No headache. PSYCHIATRY: She has history of manic depressive illness and history of anxiety. ENDOCRINE: No polyuria or polydipsia. No heat or cold intolerance.   PAST MEDICAL HISTORY:  1. Systemic hypertension. 2. Hypercholesterolemia. 3. Chronic obstructive pulmonary disease. 4. Questionable history of coronary artery disease. 5. Gastroesophageal reflux disease.  6. Diabetes mellitus type II, on diet. 7. Manic depressive illness.   PAST SURGICAL HISTORY: Cholecystectomy.   FAMILY HISTORY: Her mother died from complications of emphysema. Her father had some kind of esophageal growth and he died. She is unsure if it was cancer or not. She has one aunt who has diabetes mellitus.   SOCIAL HISTORY: She is divorced. She lives at home. She is unemployed and living on disability.   SOCIAL HABITS: Chronic smoker, continues to smoke 1 pack per day since age of 77. She does not drink alcohol. States that she quit that when she was a teenager. No history of drug abuse.   ADMISSION MEDICATIONS:  1. Aspirin 81 mg a day. 2. Cardizem LA 240 mg once a day. 3. Clonazepam 0.5 mg in the morning and 1 mg at bedtime.  4. Colace 100 mg twice a day. 5. Imdur 30 mg once a day. 6. Lithium 300 mg in the morning and 450 mg at bedtime.  7. Os-Cal D 500 mg twice a day. 8. Prilosec 20 mg a day. 9. Tylenol 325 mg p.r.n.  10. Vitamin D 1000 units once a day. 11. Zyprexa 15 mg twice a day.   ALLERGIES: Biaxin and penicillin.   PHYSICAL EXAMINATION:   VITAL SIGNS: Blood pressure 119/72, respiratory rate 18, pulse  75, temperature 98.2, oxygen saturation 91%.   GENERAL APPEARANCE: Middle-aged female. She looks older than her age. Sitting on the stretcher in no acute distress.   HEAD: No pallor. No icterus. No cyanosis.   EARS, NOSE, AND THROAT: Hearing was normal. Nasal mucosa, lips, tongue were normal. She is edentulous except for a few teeth left in the lower jaw.   EYES: Normal iris and  conjunctivae. Pupils about 7 mm, equal and reactive to light.   NECK: Supple. Trachea at midline. No thyromegaly. No cervical lymphadenopathy. No masses.   HEART: Normal S1, S2. No S3, S4. No murmur. No gallop. No carotid bruits.   RESPIRATORY: Normal breathing pattern without use of accessory muscles. No rales. There are a few scattered wheezing. There is no chest wall tenderness upon palpation.   ABDOMEN: No tenderness. No hepatosplenomegaly. No masses. No hernias.   SKIN: No ulcers. No subcutaneous nodules.   MUSCULOSKELETAL: No joint swelling. No clubbing.   NEUROLOGIC: Cranial nerves II through XII are intact. No focal motor deficit.   PSYCHIATRY: The patient is alert and oriented x3. Mood and affect were flat.   LABORATORY, DIAGNOSTIC, AND RADIOLOGICAL DATA: Chest x-ray showed cardiomegaly. No consolidation. No effusion. There were increased pulmonary markings.   EKG showed normal sinus rhythm at rate of 73 per minute, first degree atrial block, otherwise unremarkable EKG.  Serum glucose 109, BUN 8, creatinine 0.5, sodium 134, potassium 3.8. Liver function tests were normal except for slightly low albumin at 3.2. Total CPK 106. Troponin less than 0.02. CBC showed white count of 9000, hemoglobin 13, hematocrit 40, platelet count 248.   ASSESSMENT:  1. Chest pain relieved by sublingual nitroglycerin, however, it recurred. She has normal EKG and first troponin is normal.  2. Systemic hypertension.  3. Hypercholesterolemia.  4. Chronic obstructive pulmonary disease.  5. Tobacco abuse. 6. Borderline diabetes mellitus, on diet. 7. Manic depressive illness.  8. History of coronary artery disease.  9. History of gastroesophageal reflux disease.   10. History of cholecystectomy.   PLAN:  1. Admit for observation.  2. Follow-up on cardiac enzymes.  3. I scheduled the patient to have a nuclear stress test in the morning with Dr. Lavera Guise.  4. Sublingual nitroglycerin p.r.n. for the  pain.  5. I will add small dose of beta-blocker using Coreg 6.25 mg twice a day.  6. Increase aspirin from 81 mg to 325 mg a day.  7. The patient needs to quit smoking. I placed her on nicotine patch.  8. I will continue the rest of her home medications as listed above.   TIME SPENT IN EVALUATING THIS PATIENT: More than 55 minutes.    ____________________________ Clovis Pu. Lenore Manner, MD amd:drc D: 11/19/2011 02:01:05 ET T: 11/19/2011 07:09:13 ET JOB#: [id number]  cc: Clovis Pu. Lenore Manner, MD, <Dictator> Cletis Athens, MD Mike Craze Irven Coe MD ELECTRONICALLY SIGNED 11/24/2011 22:09

## 2014-10-17 NOTE — Op Note (Signed)
PATIENT NAME:  Natalie Thomas, Natalie Thomas MR#:  [id number] DATE OF BIRTH:  January 26, 1961  DATE OF PROCEDURE:  12/04/2011  PREOPERATIVE DIAGNOSIS: Visually significant cataract of the left eye.   POSTOPERATIVE DIAGNOSIS: Visually significant cataract of the left eye.   OPERATIVE PROCEDURE: Cataract extraction by phacoemulsification with implant of intraocular lens to the left eye.   SURGEON: Birder Robson, MD  ANESTHESIA:  1. Managed anesthesia care.  2. 50-50 mixture of 0.75% bupivacaine and 4% Xylocaine given as a retrobulbar block.   COMPLICATIONS: None.   TECHNIQUE:  Stop and chop.   DESCRIPTION OF PROCEDURE: The patient was examined and consented for this procedure in the preoperative holding area and then brought back to the Operating Room where the anesthesia team employed managed anesthesia care.  3.5 milliliters of the aforementioned mixture were placed in the left orbit on an Atkinson needle without complication. The left eye was then prepped and draped in the usual sterile ophthalmic fashion. A lid speculum was placed. The side-port blade was used to create a paracentesis and the anterior chamber was filled with viscoelastic. The keratome was used to create a near clear corneal incision. The continuous curvilinear capsulorrhexis was performed with a cystotome followed by the capsulorrhexis forceps. Hydrodissection and hydrodelineation were carried out with BSS on a blunt cannula. The lens was removed in a stop and chop technique. The remaining cortical material was removed with the irrigation-aspiration handpiece. The capsular bag was inflated with viscoelastic and the Tecnis ZCB00 16-diopter lens, serial number [id number] was placed in the capsular bag without complication. The remaining viscoelastic was removed from the eye with the irrigation-aspiration handpiece. The wounds were hydrated. The anterior chamber was flushed with Miostat. The eye was inflated to a physiologic pressure and the  wounds were found to be water tight. The eye was dressed with Vigamox followed by Maxitrol ointment and a protective shield was placed. The patient will follow up with me in one day.   Please note that VisionBlue dye was used to stain the anterior capsule as this was a white cataract. Also, a single 10-0 nylon stitch was placed through the main incision to ensure a good closure.      ____________________________ [name]. [name], MD wlp:bjt D: 12/04/2011 12:36:55 ET T: 12/04/2011 13:18:35 ET JOB#: [id number]  cc: [name], MD, <Dictator> [name] [name] MD ELECTRONICALLY SIGNED 12/06/2011 12:12

## 2015-01-31 ENCOUNTER — Other Ambulatory Visit: Payer: Self-pay | Admitting: Infectious Diseases

## 2017-07-05 ENCOUNTER — Emergency Department (HOSPITAL_COMMUNITY): Payer: Medicare Other

## 2017-07-05 ENCOUNTER — Other Ambulatory Visit: Payer: Self-pay

## 2017-07-05 ENCOUNTER — Emergency Department (HOSPITAL_COMMUNITY)
Admission: EM | Admit: 2017-07-05 | Discharge: 2017-07-05 | Disposition: A | Discharge status: Home / Self Care | Payer: Medicare Other | Attending: Emergency Medicine | Admitting: Emergency Medicine

## 2017-07-05 ENCOUNTER — Encounter (HOSPITAL_COMMUNITY): Payer: Self-pay

## 2017-07-05 DIAGNOSIS — J441 Chronic obstructive pulmonary disease with (acute) exacerbation: Secondary | ICD-10-CM | POA: Diagnosis not present

## 2017-07-05 DIAGNOSIS — E119 Type 2 diabetes mellitus without complications: Secondary | ICD-10-CM | POA: Diagnosis not present

## 2017-07-05 DIAGNOSIS — Z79899 Other long term (current) drug therapy: Secondary | ICD-10-CM | POA: Diagnosis not present

## 2017-07-05 DIAGNOSIS — R0602 Shortness of breath: Secondary | ICD-10-CM | POA: Diagnosis present

## 2017-07-05 DIAGNOSIS — Z7982 Long term (current) use of aspirin: Secondary | ICD-10-CM | POA: Insufficient documentation

## 2017-07-05 DIAGNOSIS — F17210 Nicotine dependence, cigarettes, uncomplicated: Secondary | ICD-10-CM | POA: Diagnosis not present

## 2017-07-05 DIAGNOSIS — I10 Essential (primary) hypertension: Secondary | ICD-10-CM | POA: Insufficient documentation

## 2017-07-05 MED ORDER — ALBUTEROL SULFATE HFA 108 (90 BASE) MCG/ACT IN AERS: 2.0000 | INHALATION_SPRAY | RESPIRATORY_TRACT | 0 refills | Status: DC | PRN

## 2017-07-05 MED ORDER — IPRATROPIUM BROMIDE 0.02 % IN SOLN
0.5000 mg | Freq: Once | RESPIRATORY_TRACT | Status: AC
  Administered 2017-07-05: 0.5 mg via RESPIRATORY_TRACT
  Filled 2017-07-05: qty 2.5

## 2017-07-05 MED ORDER — PREDNISONE 20 MG PO TABS: 60.0000 mg | ORAL_TABLET | Freq: Every day | ORAL | 0 refills | Status: DC

## 2017-07-05 MED ORDER — ALBUTEROL SULFATE (2.5 MG/3ML) 0.083% IN NEBU
5.0000 mg | INHALATION_SOLUTION | Freq: Once | RESPIRATORY_TRACT | Status: AC
  Administered 2017-07-05: 5 mg via RESPIRATORY_TRACT
  Filled 2017-07-05: qty 6

## 2017-07-05 MED ORDER — PREDNISONE 20 MG PO TABS
60.0000 mg | ORAL_TABLET | Freq: Once | ORAL | Status: AC
  Administered 2017-07-05: 60 mg via ORAL
  Filled 2017-07-05: qty 3

## 2017-07-05 NOTE — ED Notes (Signed)
[facility] called for transportation back to the facility 

## 2017-07-05 NOTE — ED Triage Notes (Signed)
Patient arrives by [facility] with complaints of SOB. Patient has COPD and has been having cough and SOB x 1 week. Patient is dependent on O2 2l/[place]. Patient states she was at facility in [place] and transferred her to [facility] 2 days ago. BP 132/94 HR 70 O2 97% 2l/[place] [facility] 168

## 2017-07-05 NOTE — ED Provider Notes (Signed)
TIME SEEN: 2:20 AM  CHIEF COMPLAINT: Cough, shortness of breath  HPI: Patient is a 57 year old female with history of COPD, hypertension, diabetes who presents to the emergency department with cough, shortness of breath, wheezing.  She does wear oxygen chronically.  She is on 2 L normally.  States she has had the symptoms for over a week.  Was previously seen in an outside hospital in [facility].  States she is having chest discomfort from coughing so much.  No chest tightness or pressure.  No lower extremity swelling or pain.  No known fever.  ROS: See HPI Constitutional: no fever  Eyes: no drainage  ENT: no runny nose   Cardiovascular: Chest soreness Resp:  SOB  GI: no vomiting GU: no dysuria Integumentary: no rash  Allergy: no hives  Musculoskeletal: no leg swelling  Neurological: no slurred speech ROS otherwise negative  PAST MEDICAL HISTORY/PAST SURGICAL HISTORY:  Past Medical History:  Diagnosis Date  . COPD (chronic obstructive pulmonary disease) ([place])   . Depression   . Diabetes mellitus, type 2 ([name])    pt denies but states that she has been treated for DM  . GERD (gastroesophageal reflux disease)   . Hypertension   . Osteoporosis     MEDICATIONS:  Prior to Admission medications   Medication Sig Start Date End Date Taking? Authorizing Provider  acetaminophen (TYLENOL) 325 MG tablet Take 650 mg by mouth every 6 (six) hours as needed for mild pain.   Yes [provider]  albuterol (PROVENTIL HFA;VENTOLIN HFA) 108 (90 Base) MCG/ACT inhaler Inhale 2 puffs into the lungs every 4 (four) hours as needed for wheezing or shortness of breath.   Yes [provider]  ALPRAZolam Duanne Moron) 0.5 MG tablet Take 0.5 mg by mouth 3 (three) times daily.   Yes [provider]  antiseptic oral rinse (BIOTENE) LIQD 2 mLs by Mouth Rinse route as needed for dry mouth.   Yes [provider]  aspirin 81 MG tablet Take 1 tablet (81 mg total) by mouth  daily. For heart health 08/06/11  Yes Greig Castilla, FNP  buPROPion (WELLBUTRIN XL) 150 MG 24 hr tablet Take 150 mg by mouth daily.   Yes [provider]  carvedilol (COREG) 25 MG tablet Take 25 mg by mouth 2 (two) times daily with a meal.   Yes [provider]  cloNIDine (CATAPRES - DOSED IN MG/24 HR) 0.3 mg/24hr patch Place 0.3 mg onto the skin once a week.   Yes [provider]  diltiazem (TIAZAC) 240 MG 24 hr capsule Take 240 mg by mouth daily.   Yes [provider]  divalproex (DEPAKOTE ER) 500 MG 24 hr tablet Take 500 mg by mouth 2 (two) times daily.  08/06/11  Yes Greig Castilla, FNP  ferrous sulfate 325 (65 FE) MG tablet Take 325 mg by mouth daily with breakfast.   Yes [provider]  fluticasone (FLONASE) 50 MCG/ACT nasal spray Place 2 sprays into both nostrils 2 (two) times daily.   Yes [provider]  fluticasone furoate-vilanterol (BREO ELLIPTA) 100-25 MCG/INH AEPB Inhale 1 puff into the lungs daily.   Yes [provider]  furosemide (LASIX) 20 MG tablet Take 1 tablet (20 mg total) by mouth daily. For fluid. 08/06/11 07/05/17 Yes Greig Castilla, FNP  hydrALAZINE (APRESOLINE) 100 MG tablet Take 100 mg by mouth every 8 (eight) hours.   Yes [provider]  HYDROcodone-acetaminophen (NORCO/VICODIN) 5-325 MG tablet Take 1 tablet by  mouth every 8 (eight) hours as needed for moderate pain.   Yes [provider]  ipratropium-albuterol (DUONEB) 0.5-2.5 (3) MG/3ML SOLN Take 3 mLs by nebulization 3 (three) times daily as needed (SOb).   Yes [provider]  lurasidone (LATUDA) 80 MG TABS tablet Take 80 mg by mouth daily with breakfast.   Yes [provider]  Melatonin 10 MG TABS Take 10 mg by mouth at bedtime.   Yes [provider]  Multiple Vitamin (MULTIVITAMIN WITH MINERALS) TABS tablet Take 1 tablet by mouth daily.   Yes [provider]  NIFEdipine (PROCARDIA  XL/ADALAT-CC) 90 MG 24 hr tablet Take 90 mg by mouth daily.   Yes [provider]  omeprazole (PRILOSEC) 20 MG capsule Take 1 capsule (20 mg total) by mouth daily. For acid reflux. Patient taking differently: Take 20 mg by mouth 2 (two) times daily before a meal. For acid reflux. 08/06/11  Yes Greig Castilla, FNP  polyethylene glycol (MIRALAX / GLYCOLAX) packet Take 17 g by mouth daily.   Yes [provider]  pravastatin (PRAVACHOL) 20 MG tablet Take 20 mg by mouth daily.   Yes [provider]  promethazine (PHENERGAN) 25 MG tablet Take 25 mg by mouth every 6 (six) hours as needed for nausea or vomiting.   Yes [provider]  sodium chloride 1 g tablet Take 1 g by mouth 3 (three) times daily with meals.   Yes [provider]  sucralfate (CARAFATE) 1 g tablet Take 1 g by mouth 4 (four) times daily.   Yes [provider]  tiotropium (SPIRIVA) 18 MCG inhalation capsule Place 18 mcg into inhaler and inhale daily.   Yes [provider]  DULoxetine (CYMBALTA) 60 MG capsule Take 1 capsule (60 mg total) by mouth 2 (two) times daily with breakfast and lunch. For depression. Patient not taking: Reported on 07/05/2017 08/06/11 08/05/12  Greig Castilla, FNP  Fluticasone-Salmeterol (ADVAIR DISKUS) 250-50 MCG/DOSE AEPB Inhale 1 puff into the lungs every 12 (twelve) hours. For copd Patient not taking: Reported on 07/05/2017 08/06/11   Greig Castilla, FNP    ALLERGIES:  Allergies  Allergen Reactions  . Clarithromycin Itching  . Penicillins Itching    SOCIAL HISTORY:  Social History   Tobacco Use  . Smoking status: Current Every Day Smoker    Packs/day: 1.00    Types: Cigarettes  Substance Use Topics  . Alcohol use: No    FAMILY HISTORY: History reviewed. No pertinent family history.  EXAM: BP 140/82   Pulse 67   Temp 98 F (36.7 C) (Oral)   Resp 20   SpO2 96%  CONSTITUTIONAL: Alert and oriented and responds appropriately to  questions.  Chronically ill-appearing, afebrile HEAD: Normocephalic EYES: Conjunctivae clear, pupils appear equal, EOMI ENT: normal nose; moist mucous membranes NECK: Supple, no meningismus, no nuchal rigidity, no LAD  CARD: RRR; S1 and S2 appreciated; no murmurs, no clicks, no rubs, no gallops RESP: Normal chest excursion without splinting or tachypnea; breath sounds bilaterally, slightly diminished at bases bilaterally, scattered expiratory wheezes, no hypoxia, no rhonchi or rales, speaking full sentences, no respiratory distress ABD/GI: Normal bowel sounds; non-distended; soft, non-tender, no rebound, no guarding, no peritoneal signs, no hepatosplenomegaly BACK:  The back appears normal and is non-tender to palpation, there is no CVA tenderness EXT: Normal ROM in all joints; non-tender to palpation; no edema; normal capillary refill; no cyanosis, no calf tenderness or swelling    SKIN: Normal color for age  and race; warm; no rash NEURO: Moves all extremities equally PSYCH: The patient's mood and manner are appropriate. Grooming and personal hygiene are appropriate.  MEDICAL DECISION MAKING: Patient here with chest soreness from coughing, shortness of breath and wheezing.  Suspect COPD exacerbation.  Do not suspect ACS, PE or dissection.  EKG shows no ischemic abnormality.  Chest x-ray shows bronchitic changes that are chronic.  No pneumonia or edema.  Will give albuterol, Atrovent, prednisone and reassess.  ED PROGRESS: Lungs are now clear.  She is asking for discharge home.  Will discharge with albuterol inhaler and prednisone burst.  Discussed return precautions.  Have advised her to quit smoking.   At this time, I do not feel there is any life-threatening condition present. I have reviewed and discussed all results (EKG, imaging, lab, urine as appropriate) and exam findings with patient/family. I have reviewed nursing notes and appropriate previous records.  I feel the patient is safe to be  discharged home without further emergent workup and can continue workup as an outpatient as needed. Discussed usual and customary return precautions. Patient/family verbalize understanding and are comfortable with this plan.  Outpatient follow-up has been provided if needed. All questions have been answered.      EKG Interpretation  Date/Time:  Friday July 05 2017 01:38:13 EST Ventricular Rate:  70 PR Interval:    QRS Duration: 82 QT Interval:  433 QTC Calculation: 468 R Axis:   17 Text Interpretation:  Sinus rhythm Low voltage, precordial leads Abnormal R-wave progression, early transition No significant change since last tracing Confirmed by Kiona Blume, Cyril Mourning [phone]) on 07/05/2017 1:50:11 AM         Kaidence Callaway, Delice Bison, DO 07/05/17 [id number]

## 2017-07-16 ENCOUNTER — Encounter (HOSPITAL_COMMUNITY): Payer: Self-pay | Admitting: Emergency Medicine

## 2017-07-16 ENCOUNTER — Emergency Department (HOSPITAL_COMMUNITY)
Admission: EM | Admit: 2017-07-16 | Discharge: 2017-07-17 | Disposition: A | Discharge status: Home / Self Care | Payer: Medicare Other | Attending: Emergency Medicine | Admitting: Emergency Medicine

## 2017-07-16 DIAGNOSIS — J441 Chronic obstructive pulmonary disease with (acute) exacerbation: Secondary | ICD-10-CM | POA: Insufficient documentation

## 2017-07-16 DIAGNOSIS — I110 Hypertensive heart disease with heart failure: Secondary | ICD-10-CM | POA: Diagnosis not present

## 2017-07-16 DIAGNOSIS — R1032 Left lower quadrant pain: Secondary | ICD-10-CM | POA: Diagnosis not present

## 2017-07-16 DIAGNOSIS — Z7982 Long term (current) use of aspirin: Secondary | ICD-10-CM | POA: Insufficient documentation

## 2017-07-16 DIAGNOSIS — K5900 Constipation, unspecified: Secondary | ICD-10-CM | POA: Diagnosis not present

## 2017-07-16 DIAGNOSIS — F17210 Nicotine dependence, cigarettes, uncomplicated: Secondary | ICD-10-CM | POA: Diagnosis not present

## 2017-07-16 DIAGNOSIS — R110 Nausea: Secondary | ICD-10-CM | POA: Diagnosis not present

## 2017-07-16 DIAGNOSIS — E871 Hypo-osmolality and hyponatremia: Secondary | ICD-10-CM | POA: Insufficient documentation

## 2017-07-16 DIAGNOSIS — Z79899 Other long term (current) drug therapy: Secondary | ICD-10-CM | POA: Diagnosis not present

## 2017-07-16 DIAGNOSIS — E119 Type 2 diabetes mellitus without complications: Secondary | ICD-10-CM | POA: Diagnosis not present

## 2017-07-16 DIAGNOSIS — I509 Heart failure, unspecified: Secondary | ICD-10-CM | POA: Diagnosis not present

## 2017-07-16 DIAGNOSIS — R05 Cough: Secondary | ICD-10-CM | POA: Diagnosis present

## 2017-07-16 HISTORY — DX: Heart failure, unspecified: I50.9

## 2017-07-16 LAB — CBC WITH DIFFERENTIAL/PLATELET
BASOS ABS: 0 10*3/uL (ref 0.0–0.1)
BASOS PCT: 0 %
Eosinophils Absolute: 0.1 10*3/uL (ref 0.0–0.7)
Eosinophils Relative: 1 %
HEMATOCRIT: 35.8 % — AB (ref 36.0–46.0)
HEMOGLOBIN: 11.8 g/dL — AB (ref 12.0–15.0)
LYMPHS PCT: 22 %
Lymphs Abs: 2.7 10*3/uL (ref 0.7–4.0)
MCH: 29.7 pg (ref 26.0–34.0)
MCHC: 33 g/dL (ref 30.0–36.0)
MCV: 90.2 fL (ref 78.0–100.0)
MONO ABS: 0.9 10*3/uL (ref 0.1–1.0)
Monocytes Relative: 8 %
NEUTROS ABS: 8.2 10*3/uL — AB (ref 1.7–7.7)
NEUTROS PCT: 69 %
Platelets: 241 10*3/uL (ref 150–400)
RBC: 3.97 MIL/uL (ref 3.87–5.11)
RDW: 14.6 % (ref 11.5–15.5)
WBC: 11.7 10*3/uL — AB (ref 4.0–10.5)

## 2017-07-16 MED ORDER — IPRATROPIUM-ALBUTEROL 0.5-2.5 (3) MG/3ML IN SOLN
3.0000 mL | Freq: Once | RESPIRATORY_TRACT | Status: AC
  Administered 2017-07-17: 3 mL via RESPIRATORY_TRACT
  Filled 2017-07-16: qty 3

## 2017-07-16 MED ORDER — OXYCODONE-ACETAMINOPHEN 5-325 MG PO TABS
1.0000 | ORAL_TABLET | Freq: Once | ORAL | Status: AC
  Administered 2017-07-17: 1 via ORAL
  Filled 2017-07-16: qty 1

## 2017-07-16 NOTE — ED Triage Notes (Addendum)
Per [facility], Pt from [facility]. Pt complaining of productive cough x 1 month, . Pt also complaining of nausea for 2 weeks. Pt has knot to LLQ abdomen with some bruising, unsure how long it has been there, complains of pain to area, also complaining of back pain. Reports O2 sats 88% on RA, pt wears 2 L O2 [place] at night.

## 2017-07-17 ENCOUNTER — Emergency Department (HOSPITAL_COMMUNITY): Payer: Medicare Other

## 2017-07-17 DIAGNOSIS — J441 Chronic obstructive pulmonary disease with (acute) exacerbation: Secondary | ICD-10-CM | POA: Diagnosis not present

## 2017-07-17 LAB — COMPREHENSIVE METABOLIC PANEL
ALBUMIN: 3.2 g/dL — AB (ref 3.5–5.0)
ALT: 12 U/L — AB (ref 14–54)
AST: 16 U/L (ref 15–41)
Alkaline Phosphatase: 59 U/L (ref 38–126)
Anion gap: 10 (ref 5–15)
BILIRUBIN TOTAL: 0.3 mg/dL (ref 0.3–1.2)
BUN: 11 mg/dL (ref 6–20)
CO2: 28 mmol/L (ref 22–32)
CREATININE: 0.74 mg/dL (ref 0.44–1.00)
Calcium: 8.8 mg/dL — ABNORMAL LOW (ref 8.9–10.3)
Chloride: 90 mmol/L — ABNORMAL LOW (ref 101–111)
GFR calc Af Amer: >60 mL/min (ref >60)
GFR calc non Af Amer: >60 mL/min (ref >60)
GLUCOSE: 110 mg/dL — AB (ref 65–99)
POTASSIUM: 4.3 mmol/L (ref 3.5–5.1)
Sodium: 128 mmol/L — ABNORMAL LOW (ref 135–145)
TOTAL PROTEIN: 6.7 g/dL (ref 6.5–8.1)

## 2017-07-17 LAB — URINALYSIS, ROUTINE W REFLEX MICROSCOPIC
BILIRUBIN URINE: NEGATIVE
Glucose, UA: NEGATIVE mg/dL
Hgb urine dipstick: NEGATIVE
KETONES UR: NEGATIVE mg/dL
LEUKOCYTES UA: NEGATIVE
Nitrite: NEGATIVE
PROTEIN: NEGATIVE mg/dL
SPECIFIC GRAVITY, URINE: 1.004 — AB (ref 1.005–1.030)
pH: 7 (ref 5.0–8.0)

## 2017-07-17 LAB — LIPASE, BLOOD: Lipase: 31 U/L (ref 11–51)

## 2017-07-17 MED ORDER — POLYETHYLENE GLYCOL 3350 17 G PO PACK: 17.0000 g | PACK | Freq: Every day | ORAL | 0 refills | Status: DC

## 2017-07-17 MED ORDER — DEXAMETHASONE SODIUM PHOSPHATE 10 MG/ML IJ SOLN
10.0000 mg | Freq: Once | INTRAMUSCULAR | Status: AC
  Administered 2017-07-17: 10 mg via INTRAMUSCULAR
  Filled 2017-07-17: qty 1

## 2017-07-17 MED ORDER — DOXYCYCLINE HYCLATE 100 MG PO CAPS: 100.0000 mg | ORAL_CAPSULE | Freq: Two times a day (BID) | ORAL | 0 refills | Status: DC

## 2017-07-17 NOTE — Discharge Instructions (Signed)
He was seen today for multiple complaints.  You likely have an element of acute bronchitis or COPD.  You were given a dose of Decadron.  Take doxycycline for your upper respiratory symptoms.  He was also noted to be constipated.  Make sure to take MiraLAX daily for soft stools.  Your sodium level was 128.  This is comparable to your prior.  Watch fluid intake and follow-up with your primary physician for recheck.

## 2017-07-17 NOTE — ED Provider Notes (Signed)
[facility] EMERGENCY DEPARTMENT Provider Note   CSN: [id number] Arrival date & time: 07/16/17  2229     History   Chief Complaint Chief Complaint  Patient presents with  . Cough    HPI Natalie Thomas is a 57 y.o. female.  HPI  This is a 57 year old female with a history of heart failure, COPD, diabetes who presents with multiple complaints.  Patient reports 1 month history of cough and upper respiratory symptoms.  She also reports abdominal pain, nausea.  No vomiting or diarrhea.  Last normal bowel movement was yesterday.  Denies any fevers.  She reports that her oxygen levels today were low.  At baseline she wears 2 L of nasal cannula.  Regarding her abdominal pain she reports that she has a knot in her left lower quadrant with bruising.  She is not sure how long it has been there.  Currently she rates her pain at 10 out of 10.  She is not taking anything for pain.  Past Medical History:  Diagnosis Date  . CHF (congestive heart failure) ([place])   . COPD (chronic obstructive pulmonary disease) ([place])   . Depression   . Diabetes mellitus, type 2 ([place])    pt denies but states that she has been treated for DM  . GERD (gastroesophageal reflux disease)   . Hypertension   . Osteoporosis     Patient Active Problem List   Diagnosis Date Noted  . Incontinence of urine 08/14/2011  . Hyponatremia 07/27/2011  . Schizoaffective disorder, bipolar type ([place]) 07/05/2011  . Cough 05/08/2011  . Encounter for long-term (current) use of other medications 04/09/2011  . Lumbar disc disease 04/09/2011  . Polycythemia secondary to smoking 04/09/2011  . HIP PAIN, LEFT 12/12/2007  . 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 DIABETES MELLITUS, TYPE II 04/08/2007  . MENOPAUSE, PREMATURE 04/08/2007  . HYPONATREMIA 04/08/2007  . SCHIZOAFFECTIVE DISORDER 04/08/2007  . DEPRESSION 04/08/2007  . HYPERTENSION 04/08/2007  . COPD 04/08/2007  . GERD 04/08/2007  . OSTEOPOROSIS  04/08/2007  . DYSURIA 04/08/2007    Past Surgical History:  Procedure Laterality Date  . EYE SURGERY     on left  eye, pt states that she sees bad out of the right eye and needs surgery there  . PELVIC FRACTURE SURGERY      OB History    No data available       Home Medications    Prior to Admission medications   Medication Sig Start Date End Date Taking? Authorizing Provider  acetaminophen (TYLENOL) 325 MG tablet Take 650 mg by mouth every 6 (six) hours as needed for mild pain.    [provider]  albuterol (PROVENTIL HFA;VENTOLIN HFA) 108 (90 Base) MCG/ACT inhaler Inhale 2 puffs into the lungs every 4 (four) hours as needed for wheezing or shortness of breath.    [provider]  albuterol (PROVENTIL HFA;VENTOLIN HFA) 108 (90 Base) MCG/ACT inhaler Inhale 2 puffs into the lungs every 4 (four) hours as needed for wheezing or shortness of breath. 07/05/17   Ward, Delice Bison, DO  ALPRAZolam Duanne Moron) 0.5 MG tablet Take 0.5 mg by mouth 3 (three) times daily.    [provider]  antiseptic oral rinse (BIOTENE) LIQD 2 mLs by Mouth Rinse route as needed for dry mouth.    [provider]  aspirin 81 MG tablet Take 1 tablet (81 mg total) by mouth daily. For heart health 08/06/11   Greig Castilla, FNP  buPROPion (  WELLBUTRIN XL) 150 MG 24 hr tablet Take 150 mg by mouth daily.    [provider]  carvedilol (COREG) 25 MG tablet Take 25 mg by mouth 2 (two) times daily with a meal.    [provider]  cloNIDine (CATAPRES - DOSED IN MG/24 HR) 0.3 mg/24hr patch Place 0.3 mg onto the skin once a week.    [provider]  diltiazem (TIAZAC) 240 MG 24 hr capsule Take 240 mg by mouth daily.    [provider]  divalproex (DEPAKOTE ER) 500 MG 24 hr tablet Take 500 mg by mouth 2 (two) times daily.  08/06/11   Greig Castilla, FNP  doxycycline (VIBRAMYCIN) 100 MG capsule Take 1 capsule (100 mg total) by mouth 2 (two) times daily.  07/17/17   Jadynn Epping, Barbette Hair, MD  DULoxetine (CYMBALTA) 60 MG capsule Take 1 capsule (60 mg total) by mouth 2 (two) times daily with breakfast and lunch. For depression. Patient not taking: Reported on 07/05/2017 08/06/11 08/05/12  Greig Castilla, FNP  ferrous sulfate 325 (65 FE) MG tablet Take 325 mg by mouth daily with breakfast.    [provider]  fluticasone (FLONASE) 50 MCG/ACT nasal spray Place 2 sprays into both nostrils 2 (two) times daily.    [provider]  fluticasone furoate-vilanterol (BREO ELLIPTA) 100-25 MCG/INH AEPB Inhale 1 puff into the lungs daily.    [provider]  Fluticasone-Salmeterol (ADVAIR DISKUS) 250-50 MCG/DOSE AEPB Inhale 1 puff into the lungs every 12 (twelve) hours. For copd Patient not taking: Reported on 07/05/2017 08/06/11   Greig Castilla, FNP  furosemide (LASIX) 20 MG tablet Take 1 tablet (20 mg total) by mouth daily. For fluid. 08/06/11 07/05/17  Greig Castilla, FNP  hydrALAZINE (APRESOLINE) 100 MG tablet Take 100 mg by mouth every 8 (eight) hours.    [provider]  HYDROcodone-acetaminophen (NORCO/VICODIN) 5-325 MG tablet Take 1 tablet by mouth every 8 (eight) hours as needed for moderate pain.    [provider]  ipratropium-albuterol (DUONEB) 0.5-2.5 (3) MG/3ML SOLN Take 3 mLs by nebulization 3 (three) times daily as needed (SOb).    [provider]  lurasidone (LATUDA) 80 MG TABS tablet Take 80 mg by mouth daily with breakfast.    [provider]  Melatonin 10 MG TABS Take 10 mg by mouth at bedtime.    [provider]  Multiple Vitamin (MULTIVITAMIN WITH MINERALS) TABS tablet Take 1 tablet by mouth daily.    [provider]  NIFEdipine (PROCARDIA XL/ADALAT-CC) 90 MG 24 hr tablet Take 90 mg by mouth daily.    [provider]  omeprazole (PRILOSEC) 20 MG capsule Take 1 capsule (20 mg total) by mouth daily. For acid reflux. Patient taking differently: Take 20 mg by  mouth 2 (two) times daily before a meal. For acid reflux. 08/06/11   Greig Castilla, FNP  polyethylene glycol (MIRALAX / GLYCOLAX) packet Take 17 g by mouth daily.    [provider]  polyethylene glycol (MIRALAX) packet Take 17 g by mouth daily. 07/17/17   Baby Gieger, Barbette Hair, MD  pravastatin (PRAVACHOL) 20 MG tablet Take 20 mg by mouth daily.    [provider]  predniSONE (DELTASONE) 20 MG tablet Take 3 tablets (60 mg total) by mouth daily. 07/05/17   Ward, Delice Bison, DO  promethazine (PHENERGAN) 25 MG tablet Take 25 mg by mouth every 6 (six) hours as needed for nausea or vomiting.    [provider]  sodium chloride 1 g tablet Take 1 g by mouth 3 (three) times daily with meals.    [provider]  sucralfate (CARAFATE) 1 g tablet Take 1 g by mouth 4 (four) times daily.    [provider]  tiotropium (SPIRIVA) 18 MCG inhalation capsule Place 18 mcg into inhaler and inhale daily.    [provider]    Family History History reviewed. No pertinent family history.  Social History Social History   Tobacco Use  . Smoking status: Current Every Day Smoker    Packs/day: 0.25    Types: Cigarettes  . Smokeless tobacco: Never Used  Substance Use Topics  . Alcohol use: No  . Drug use: No     Allergies   Clarithromycin and Penicillins   Review of Systems Review of Systems  Constitutional: Negative for fever.  HENT: Negative for congestion.   Respiratory: Positive for cough and shortness of breath.   Cardiovascular: Negative for chest pain.  Gastrointestinal: Positive for abdominal pain and nausea. Negative for vomiting.  Genitourinary: Negative for dysuria.  All other systems reviewed and are negative.    Physical Exam Updated Vital Signs BP (!) 144/63   Pulse 72   Temp 97.8 F (36.6 C) (Oral)   Resp (!) 22   SpO2 97%   Physical Exam  Constitutional: She is oriented to person, place, and time.  Chronically  ill-appearing, no acute distress  HENT:  Head: Normocephalic and atraumatic.  Cardiovascular: Normal rate, regular rhythm and normal heart sounds.  Pulmonary/Chest: Effort normal. No respiratory distress. She has wheezes.  Slight expiratory wheeze fair air movement  Abdominal: Soft. Bowel sounds are normal.  No significant tenderness palpation, there is slight firmness in the subcu tissue of the left lower quadrant, no tenderness palpation or overlying skin changes  Musculoskeletal: She exhibits no edema.  Neurological: She is alert and oriented to person, place, and time.  Skin: Skin is warm and dry.  Psychiatric: She has a normal mood and affect.  Nursing note and vitals reviewed.    ED Treatments / Results  Labs (all labs ordered are listed, but only abnormal results are displayed) Labs Reviewed  CBC WITH DIFFERENTIAL/PLATELET - Abnormal; Notable for the following components:      Result Value   WBC 11.7 (*)    Hemoglobin 11.8 (*)    HCT 35.8 (*)    Neutro Abs 8.2 (*)    All other components within normal limits  COMPREHENSIVE METABOLIC PANEL - Abnormal; Notable for the following components:   Sodium 128 (*)    Chloride 90 (*)    Glucose, Bld 110 (*)    Calcium 8.8 (*)    Albumin 3.2 (*)    ALT 12 (*)    All other components within normal limits  URINALYSIS, ROUTINE W REFLEX MICROSCOPIC - Abnormal; Notable for the following components:   Color, Urine STRAW (*)    Specific Gravity, Urine 1.004 (*)    All other components within normal limits  LIPASE, BLOOD    EKG  EKG Interpretation None       Radiology Dg Abdomen Acute W/chest  Result Date: 07/17/2017 CLINICAL DATA:  Productive cough for 1 month. Nausea for 2 weeks. Left lower quadrant abdominal not and bruising. Decreased oxygen saturation. EXAM: DG ABDOMEN ACUTE W/ 1V CHEST COMPARISON:  Chest 07/05/2017 FINDINGS: Cardiac enlargement. No airspace disease or consolidation in the lungs. No blunting of  costophrenic angles. No pneumothorax. Calcification of the aorta. Gas and stool  throughout the colon. No small or large bowel distention. No free intra-abdominal air. No abnormal air-fluid levels. No radiopaque stones. Benign-appearing calcification in the sacral ala. Surgical clips in the right upper quadrant. Degenerative changes in the lumbar spine. Vascular calcifications. IMPRESSION: Cardiac enlargement.  No evidence of active pulmonary disease. Nonobstructive bowel gas pattern with stool-filled colon. Aortic atherosclerosis. Electronically Signed   By: Lucienne Capers M.D.   On: 07/17/2017 01:12    Procedures Procedures (including critical care time)  Medications Ordered in ED Medications  ipratropium-albuterol (DUONEB) 0.5-2.5 (3) MG/3ML nebulizer solution 3 mL (3 mLs Nebulization Given 07/17/17 0044)  oxyCODONE-acetaminophen (PERCOCET/ROXICET) 5-325 MG per tablet 1 tablet (1 tablet Oral Given 07/17/17 0043)  dexamethasone (DECADRON) injection 10 mg (10 mg Intramuscular Given 07/17/17 0139)     Initial Impression / Assessment and Plan / ED Course  I have reviewed the triage vital signs and the nursing notes.  Pertinent labs & imaging results that were available during my care of the patient were reviewed by me and considered in my medical decision making (see chart for details).     Patient presents with multiple complaints.  Acute on chronic in nature.  She is overall nontoxic appearing.  Vital signs are reassuring.  She is on her home oxygen requirement of 2 L satting 97%.  She has some wheeze on exam.  She is given a DuoNeb and Decadron.  Lab work obtained.  Sodium is 128.  This is comparable to prior.  Acute abdominal series shows no evidence of pneumonia.  Mild cardiac enlargement.  She has no evidence of volume overload on exam.  Suspect a COPD exacerbation causing her wheezing.  Additionally, she notes to have a stool filled colon.  Recommend MiraLAX daily.  Will discharge home with  doxycycline for COPD exacerbation.  Titrate oxygen as needed.  Patient is not ambulatory at baseline and uses a wheelchair.  She is in no acute distress.  Doubt acute emergent process.  After history, exam, and medical workup I feel the patient has been appropriately medically screened and is safe for discharge home. Pertinent diagnoses were discussed with the patient. Patient was given return precautions.   Final Clinical Impressions(s) / ED Diagnoses   Final diagnoses:  Chronic obstructive pulmonary disease with acute exacerbation (HCC)  Constipation, unspecified constipation type  Hyponatremia    ED Discharge Orders        Ordered    doxycycline (VIBRAMYCIN) 100 MG capsule  2 times daily     07/17/17 0144    polyethylene glycol (MIRALAX) packet  Daily     07/17/17 0144       Merryl Hacker, MD 07/17/17 0147

## 2017-07-17 NOTE — ED Notes (Signed)
Patient transported to X-ray 

## 2017-07-25 ENCOUNTER — Encounter: Payer: Self-pay | Admitting: Gastroenterology

## 2017-07-25 ENCOUNTER — Emergency Department (HOSPITAL_COMMUNITY)
Admission: EM | Admit: 2017-07-25 | Discharge: 2017-07-25 | Disposition: A | Discharge status: Home / Self Care | Payer: Medicare Other | Attending: Emergency Medicine | Admitting: Emergency Medicine

## 2017-07-25 ENCOUNTER — Encounter (HOSPITAL_COMMUNITY): Payer: Self-pay

## 2017-07-25 ENCOUNTER — Emergency Department (HOSPITAL_COMMUNITY): Payer: Medicare Other

## 2017-07-25 ENCOUNTER — Other Ambulatory Visit: Payer: Self-pay

## 2017-07-25 DIAGNOSIS — Z7982 Long term (current) use of aspirin: Secondary | ICD-10-CM | POA: Diagnosis not present

## 2017-07-25 DIAGNOSIS — I10 Essential (primary) hypertension: Secondary | ICD-10-CM | POA: Diagnosis not present

## 2017-07-25 DIAGNOSIS — E119 Type 2 diabetes mellitus without complications: Secondary | ICD-10-CM | POA: Diagnosis not present

## 2017-07-25 DIAGNOSIS — E860 Dehydration: Secondary | ICD-10-CM

## 2017-07-25 DIAGNOSIS — R062 Wheezing: Secondary | ICD-10-CM | POA: Diagnosis not present

## 2017-07-25 DIAGNOSIS — F17210 Nicotine dependence, cigarettes, uncomplicated: Secondary | ICD-10-CM | POA: Diagnosis not present

## 2017-07-25 DIAGNOSIS — K437 Other and unspecified ventral hernia with gangrene: Secondary | ICD-10-CM | POA: Diagnosis not present

## 2017-07-25 DIAGNOSIS — J449 Chronic obstructive pulmonary disease, unspecified: Secondary | ICD-10-CM | POA: Diagnosis not present

## 2017-07-25 DIAGNOSIS — K439 Ventral hernia without obstruction or gangrene: Secondary | ICD-10-CM

## 2017-07-25 DIAGNOSIS — R1084 Generalized abdominal pain: Secondary | ICD-10-CM | POA: Diagnosis not present

## 2017-07-25 DIAGNOSIS — Z79899 Other long term (current) drug therapy: Secondary | ICD-10-CM | POA: Diagnosis not present

## 2017-07-25 LAB — COMPREHENSIVE METABOLIC PANEL
ALBUMIN: 3.2 g/dL — AB (ref 3.5–5.0)
ALK PHOS: 67 U/L (ref 38–126)
ALT: 15 U/L (ref 14–54)
AST: 16 U/L (ref 15–41)
Anion gap: 11 (ref 5–15)
CALCIUM: 8.8 mg/dL — AB (ref 8.9–10.3)
CO2: 24 mmol/L (ref 22–32)
Chloride: 92 mmol/L — ABNORMAL LOW (ref 101–111)
Creatinine, Ser: 0.48 mg/dL (ref 0.44–1.00)
GFR calc Af Amer: >60 mL/min (ref >60)
GFR calc non Af Amer: >60 mL/min (ref >60)
GLUCOSE: 104 mg/dL — AB (ref 65–99)
Potassium: 4.1 mmol/L (ref 3.5–5.1)
SODIUM: 127 mmol/L — AB (ref 135–145)
Total Bilirubin: 0.3 mg/dL (ref 0.3–1.2)
Total Protein: 6.5 g/dL (ref 6.5–8.1)

## 2017-07-25 LAB — URINALYSIS, ROUTINE W REFLEX MICROSCOPIC
BILIRUBIN URINE: NEGATIVE
Glucose, UA: NEGATIVE mg/dL
HGB URINE DIPSTICK: NEGATIVE
Ketones, ur: NEGATIVE mg/dL
Leukocytes, UA: NEGATIVE
Nitrite: NEGATIVE
PROTEIN: NEGATIVE mg/dL
SPECIFIC GRAVITY, URINE: 1.011 (ref 1.005–1.030)
pH: 8 (ref 5.0–8.0)

## 2017-07-25 LAB — LIPASE, BLOOD: Lipase: 24 U/L (ref 11–51)

## 2017-07-25 LAB — CBC
HCT: 35.1 % — ABNORMAL LOW (ref 36.0–46.0)
Hemoglobin: 11.9 g/dL — ABNORMAL LOW (ref 12.0–15.0)
MCH: 29.8 pg (ref 26.0–34.0)
MCHC: 33.9 g/dL (ref 30.0–36.0)
MCV: 88 fL (ref 78.0–100.0)
Platelets: 199 10*3/uL (ref 150–400)
RBC: 3.99 MIL/uL (ref 3.87–5.11)
RDW: 13.9 % (ref 11.5–15.5)
WBC: 8 10*3/uL (ref 4.0–10.5)

## 2017-07-25 LAB — I-STAT BETA HCG BLOOD, ED (MC, WL, AP ONLY)

## 2017-07-25 LAB — CBG MONITORING, ED: GLUCOSE-CAPILLARY: 107 mg/dL — AB (ref 65–99)

## 2017-07-25 LAB — I-STAT TROPONIN, ED: Troponin i, poc: 0 ng/mL (ref 0.00–0.08)

## 2017-07-25 MED ORDER — IOPAMIDOL (ISOVUE-300) INJECTION 61%
INTRAVENOUS | Status: AC
  Administered 2017-07-25: 100 mL
  Filled 2017-07-25: qty 100

## 2017-07-25 MED ORDER — FENTANYL CITRATE (PF) 100 MCG/2ML IJ SOLN
50.0000 ug | Freq: Once | INTRAMUSCULAR | Status: AC
  Administered 2017-07-25: 50 ug via INTRAVENOUS
  Filled 2017-07-25: qty 2

## 2017-07-25 MED ORDER — HYDROCODONE-ACETAMINOPHEN 5-325 MG PO TABS
1.0000 | ORAL_TABLET | Freq: Once | ORAL | Status: AC
  Administered 2017-07-25: 1 via ORAL
  Filled 2017-07-25: qty 1

## 2017-07-25 MED ORDER — SODIUM CHLORIDE 0.9 % IV BOLUS (SEPSIS)
1000.0000 mL | Freq: Once | INTRAVENOUS | Status: AC
  Administered 2017-07-25: 1000 mL via INTRAVENOUS

## 2017-07-25 MED ORDER — ONDANSETRON HCL 4 MG/2ML IJ SOLN
4.0000 mg | Freq: Once | INTRAMUSCULAR | Status: AC
  Administered 2017-07-25: 4 mg via INTRAVENOUS
  Filled 2017-07-25: qty 2

## 2017-07-25 MED ORDER — IPRATROPIUM-ALBUTEROL 0.5-2.5 (3) MG/3ML IN SOLN
3.0000 mL | Freq: Once | RESPIRATORY_TRACT | Status: AC
  Administered 2017-07-25: 3 mL via RESPIRATORY_TRACT
  Filled 2017-07-25: qty 3

## 2017-07-25 NOTE — ED Provider Notes (Signed)
[facility] EMERGENCY DEPARTMENT Provider Note   CSN: [id number] Arrival date & time: 07/25/17  0139     History   Chief Complaint Chief Complaint  Patient presents with  . Abdominal Pain  . Chest Pain    HPI Natalie Thomas is a 57 y.o. female.  The history is provided by the patient.  Abdominal Pain   This is a recurrent problem. The current episode started more than 1 week ago. The problem occurs daily. The problem has been gradually worsening. The pain is located in the generalized abdominal region. The pain is moderate. Associated symptoms include nausea and constipation. Pertinent negatives include fever. The symptoms are aggravated by palpation. Nothing relieves the symptoms.  Chest Pain   Associated symptoms include abdominal pain, cough and nausea. Pertinent negatives include no fever.  Patient with history of CHF/COPD/depression/diabetes presents from [place] for nausea and abdominal pain for 6 months.  She denies vomiting/diarrhea.  She reports recent constipation and small hard bowel movements.  She reports her pain is worsening.  The pain at times will go from her abdomen into her chest. She reports distant history of  Abdominal blockages.  She also reports her sodium is low.  She also reports she is rattling in her chest.  Past Medical History:  Diagnosis Date  . CHF (congestive heart failure) ([place])   . COPD (chronic obstructive pulmonary disease) ([place])   . Depression   . Diabetes mellitus, type 2 ([place])    pt denies but states that she has been treated for DM  . GERD (gastroesophageal reflux disease)   . Hypertension   . Osteoporosis     Patient Active Problem List   Diagnosis Date Noted  . Incontinence of urine 08/14/2011  . Hyponatremia 07/27/2011  . Schizoaffective disorder, bipolar type ([place]) 07/05/2011  . Cough 05/08/2011  . Encounter for long-term (current) use of other medications 04/09/2011  . Lumbar disc disease  04/09/2011  . Polycythemia secondary to smoking 04/09/2011  . HIP PAIN, LEFT 12/12/2007  . 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 DIABETES MELLITUS, TYPE II 04/08/2007  . MENOPAUSE, PREMATURE 04/08/2007  . HYPONATREMIA 04/08/2007  . SCHIZOAFFECTIVE DISORDER 04/08/2007  . DEPRESSION 04/08/2007  . HYPERTENSION 04/08/2007  . COPD 04/08/2007  . GERD 04/08/2007  . OSTEOPOROSIS 04/08/2007  . DYSURIA 04/08/2007    Past Surgical History:  Procedure Laterality Date  . EYE SURGERY     on left  eye, pt states that she sees bad out of the right eye and needs surgery there  . PELVIC FRACTURE SURGERY      OB History    No data available       Home Medications    Prior to Admission medications   Medication Sig Start Date End Date Taking? Authorizing Provider  acetaminophen (TYLENOL) 325 MG tablet Take 650 mg by mouth every 6 (six) hours as needed for mild pain.    [provider]  albuterol (PROVENTIL HFA;VENTOLIN HFA) 108 (90 Base) MCG/ACT inhaler Inhale 2 puffs into the lungs every 4 (four) hours as needed for wheezing or shortness of breath.    [provider]  albuterol (PROVENTIL HFA;VENTOLIN HFA) 108 (90 Base) MCG/ACT inhaler Inhale 2 puffs into the lungs every 4 (four) hours as needed for wheezing or shortness of breath. 07/05/17   Ward, Delice Bison, DO  ALPRAZolam Duanne Moron) 0.5 MG tablet Take 0.5 mg by mouth 3 (three) times daily.    [provider]  antiseptic oral  rinse (BIOTENE) LIQD 2 mLs by Mouth Rinse route as needed for dry mouth.    [provider]  aspirin 81 MG tablet Take 1 tablet (81 mg total) by mouth daily. For heart health 08/06/11   Greig Castilla, FNP  buPROPion (WELLBUTRIN XL) 150 MG 24 hr tablet Take 150 mg by mouth daily.    [provider]  carvedilol (COREG) 25 MG tablet Take 25 mg by mouth 2 (two) times daily with a meal.    [provider]  cloNIDine (CATAPRES - DOSED IN MG/24 HR) 0.3 mg/24hr patch Place  0.3 mg onto the skin once a week.    [provider]  diltiazem (TIAZAC) 240 MG 24 hr capsule Take 240 mg by mouth daily.    [provider]  divalproex (DEPAKOTE ER) 500 MG 24 hr tablet Take 500 mg by mouth 2 (two) times daily.  08/06/11   Greig Castilla, FNP  doxycycline (VIBRAMYCIN) 100 MG capsule Take 1 capsule (100 mg total) by mouth 2 (two) times daily. 07/17/17   Horton, Barbette Hair, MD  DULoxetine (CYMBALTA) 60 MG capsule Take 1 capsule (60 mg total) by mouth 2 (two) times daily with breakfast and lunch. For depression. Patient not taking: Reported on 07/05/2017 08/06/11 08/05/12  Greig Castilla, FNP  ferrous sulfate 325 (65 FE) MG tablet Take 325 mg by mouth daily with breakfast.    [provider]  fluticasone (FLONASE) 50 MCG/ACT nasal spray Place 2 sprays into both nostrils 2 (two) times daily.    [provider]  fluticasone furoate-vilanterol (BREO ELLIPTA) 100-25 MCG/INH AEPB Inhale 1 puff into the lungs daily.    [provider]  Fluticasone-Salmeterol (ADVAIR DISKUS) 250-50 MCG/DOSE AEPB Inhale 1 puff into the lungs every 12 (twelve) hours. For copd Patient not taking: Reported on 07/05/2017 08/06/11   Greig Castilla, FNP  furosemide (LASIX) 20 MG tablet Take 1 tablet (20 mg total) by mouth daily. For fluid. 08/06/11 07/05/17  Greig Castilla, FNP  hydrALAZINE (APRESOLINE) 100 MG tablet Take 100 mg by mouth every 8 (eight) hours.    [provider]  HYDROcodone-acetaminophen (NORCO/VICODIN) 5-325 MG tablet Take 1 tablet by mouth every 8 (eight) hours as needed for moderate pain.    [provider]  ipratropium-albuterol (DUONEB) 0.5-2.5 (3) MG/3ML SOLN Take 3 mLs by nebulization 3 (three) times daily as needed (SOb).    [provider]  lurasidone (LATUDA) 80 MG TABS tablet Take 80 mg by mouth daily with breakfast.    [provider]  Melatonin 10 MG TABS Take 10 mg by mouth at bedtime.    [provider]  Multiple Vitamin (MULTIVITAMIN WITH MINERALS) TABS tablet Take 1 tablet by mouth daily.    [provider]  NIFEdipine (PROCARDIA XL/ADALAT-CC) 90 MG 24 hr tablet Take 90 mg by mouth daily.    [provider]  omeprazole (PRILOSEC) 20 MG capsule Take 1 capsule (20 mg total) by mouth daily. For acid reflux. Patient taking differently: Take 20 mg by mouth 2 (two) times daily before a meal. For acid reflux. 08/06/11   Greig Castilla, FNP  polyethylene glycol (MIRALAX / GLYCOLAX) packet Take 17 g by mouth daily.    [provider]  polyethylene glycol (MIRALAX) packet Take 17 g by mouth daily. 07/17/17   Horton, Barbette Hair, MD  pravastatin (PRAVACHOL) 20 MG tablet Take 20 mg by mouth daily.    [provider]  predniSONE (DELTASONE)  20 MG tablet Take 3 tablets (60 mg total) by mouth daily. 07/05/17   Ward, Delice Bison, DO  promethazine (PHENERGAN) 25 MG tablet Take 25 mg by mouth every 6 (six) hours as needed for nausea or vomiting.    [provider]  sodium chloride 1 g tablet Take 1 g by mouth 3 (three) times daily with meals.    [provider]  sucralfate (CARAFATE) 1 g tablet Take 1 g by mouth 4 (four) times daily.    [provider]  tiotropium (SPIRIVA) 18 MCG inhalation capsule Place 18 mcg into inhaler and inhale daily.    [provider]    Family History History reviewed. No pertinent family history.  Social History Social History   Tobacco Use  . Smoking status: Current Every Day Smoker    Packs/day: 0.25    Types: Cigarettes  . Smokeless tobacco: Never Used  Substance Use Topics  . Alcohol use: No  . Drug use: No     Allergies   Clarithromycin and Penicillins   Review of Systems Review of Systems  Constitutional: Negative for fever.  Respiratory: Positive for cough.   Cardiovascular: Positive for chest pain.  Gastrointestinal: Positive for abdominal pain, constipation and nausea.    All other systems reviewed and are negative.    Physical Exam Updated Vital Signs BP (!) 138/53   Pulse 63   Temp 98.2 F (36.8 C) (Oral)   Resp 13   Ht 1.651 m (5\' 5" )   Wt 81.6 kg (180 lb)   SpO2 97%   BMI 29.95 kg/m   Physical Exam CONSTITUTIONAL: Chronically ill-appearing, appears older than stated age. HEAD: Normocephalic/atraumatic EYES: EOMI/PERRL, no icterus ENMT: Mucous membranes moist NECK: supple no meningeal signs SPINE/BACK:entire spine nontender CV: S1/S2 noted, no murmurs/rubs/gallops noted LUNGS: Lungs are clear to auscultation bilaterally, no apparent distress ABDOMEN: soft, diffuse moderate tenderness, no rebound or guarding, bowel sounds noted throughout abdomen GU:no cva tenderness NEURO: Pt is awake/alert/appropriate, moves all extremitiesx4.  No facial droop.   EXTREMITIES: pulses normal/equal, full ROM SKIN: warm, color normal PSYCH: Anxious  ED Treatments / Results  Labs (all labs ordered are listed, but only abnormal results are displayed) Labs Reviewed  COMPREHENSIVE METABOLIC PANEL - Abnormal; Notable for the following components:      Result Value   Sodium 127 (*)    Chloride 92 (*)    Glucose, Bld 104 (*)    BUN <5 (*)    Calcium 8.8 (*)    Albumin 3.2 (*)    All other components within normal limits  CBC - Abnormal; Notable for the following components:   Hemoglobin 11.9 (*)    HCT 35.1 (*)    All other components within normal limits  URINALYSIS, ROUTINE W REFLEX MICROSCOPIC - Abnormal; Notable for the following components:   Color, Urine STRAW (*)    All other components within normal limits  CBG MONITORING, ED - Abnormal; Notable for the following components:   Glucose-Capillary 107 (*)    All other components within normal limits  LIPASE, BLOOD  I-STAT BETA HCG BLOOD, ED ([facility], [facility], AP ONLY)  I-STAT TROPONIN, ED    EKG  EKG Interpretation  Date/Time:  Thursday July 25 2017 01:49:52 EST Ventricular Rate:  70 PR  Interval:    QRS Duration: 81 QT Interval:  412 QTC Calculation: 445 R Axis:   40 Text Interpretation:  Sinus rhythm Borderline prolonged PR interval Low voltage, precordial leads Probable anteroseptal infarct, old  Minimal ST elevation, inferior leads Confirmed by Ripley Fraise [id number]) on 07/25/2017 1:56:30 AM       Radiology Dg Chest 2 View  Result Date: 07/25/2017 CLINICAL DATA:  Chest pain and sob today, HTN, DM, COPD, CHF, smoker. Pt on 2L O2 at home EXAM: CHEST  2 VIEW COMPARISON:  Chest x-ray dated 07/17/2017, chest x-ray dated 05/30/2012. FINDINGS: Stable mild cardiomegaly. Aortic atherosclerosis. Lungs are clear. No pleural effusion or pneumothorax seen. No acute or suspicious osseous finding. IMPRESSION: No active cardiopulmonary disease. No evidence of pneumonia or pulmonary edema. Stable mild cardiomegaly. Electronically Signed   By: Franki Cabot M.D.   On: 07/25/2017 02:18   Ct Abdomen Pelvis W Contrast  Result Date: 07/25/2017 CLINICAL DATA:  Mid central abdominal pain into the chest for months. Previous hernia repair. Irritable bowel syndrome. Abdominal distention. EXAM: CT ABDOMEN AND PELVIS WITH CONTRAST TECHNIQUE: Multidetector CT imaging of the abdomen and pelvis was performed using the standard protocol following bolus administration of intravenous contrast. CONTRAST:  138mL ISOVUE-300 IOPAMIDOL (ISOVUE-300) INJECTION 61% COMPARISON:  06/04/2012 FINDINGS: Lower chest: Atelectasis in the lung bases. Mild cardiac enlargement with small pericardial effusion. Hepatobiliary: No focal liver abnormality is seen. Status post cholecystectomy. No biliary dilatation. Pancreas: Unremarkable. No pancreatic ductal dilatation or surrounding inflammatory changes. Spleen: Normal in size without focal abnormality. Adrenals/Urinary Tract: No adrenal gland nodules. Scarring in the right kidney. Parenchymal stone in the upper pole associated with the area of scarring, unchanged since previous  study. No hydronephrosis or hydroureter. No ureteral stones. Bladder is unremarkable. Stomach/Bowel: Stomach and small bowel are decompressed. Diffusely stool-filled colon without abnormal distention. No wall thickening or inflammatory changes. There is an infraumbilical midline abdominal wall hernia containing small bowel. No proximal obstruction. The appendix is not identified. Vascular/Lymphatic: Aortic atherosclerosis. Prominent atherosclerotic changes throughout the abdominal aorta with probable areas of significant stenosis in the abdominal aorta and bifurcation. Probable occlusion of the origin of the left common iliac artery with reconstitution in the external iliac. Severe calcification at the origin of the superior mesenteric artery with flow demonstrated distally. No significant lymphadenopathy. Reproductive: Uterus and bilateral adnexa are unremarkable. Other: No free air or free fluid in the abdomen. Nodular infiltration in the left anterior abdominal wall fat likely representing injection sites. Musculoskeletal: Degenerative changes in the spine. Multiple lower thoracic and lumbar vertebral compression deformities, likely chronic. Old sacral fracture deformities. IMPRESSION: 1. Stone and scarring in the right kidney without change. No ureteral stone or obstruction. 2. Low midline abdominal wall hernia containing small bowel without proximal obstruction. 3. Prominent aortic atherosclerosis with likely areas of significant stenosis in the abdominal aorta and iliac arteries. Probable occlusion of the origin of the left common iliac artery with reconstitution in the external iliac. 4. Multiple lower thoracic and lumbar vertebral compression deformities and sacral fracture deformity, likely old. Electronically Signed   By: Lucienne Capers M.D.   On: 07/25/2017 04:26    Procedures Procedures   Medications Ordered in ED Medications  sodium chloride 0.9 % bolus 1,000 mL (0 mLs Intravenous Stopped  07/25/17 0439)  ipratropium-albuterol (DUONEB) 0.5-2.5 (3) MG/3ML nebulizer solution 3 mL (3 mLs Nebulization Given 07/25/17 0323)  fentaNYL (SUBLIMAZE) injection 50 mcg (50 mcg Intravenous Given 07/25/17 0325)  ondansetron (ZOFRAN) injection 4 mg (4 mg Intravenous Given 07/25/17 0328)  iopamidol (ISOVUE-300) 61 % injection (100 mLs  Contrast Given 07/25/17 0330)  HYDROcodone-acetaminophen (NORCO/VICODIN) 5-325 MG per tablet 1 tablet (1 tablet Oral Given 07/25/17 0539)  Initial Impression / Assessment and Plan / ED Course  I have reviewed the triage vital signs and the nursing notes.  Pertinent labs & imaging results that were available during my care of the patient were reviewed by me and considered in my medical decision making (see chart for details).     4:15 AM Patient with multiple medical conditions presents with abdominal pain for several months. She is a very poor historian, but it does seem that her abdominal pain is worsening.  She had diffuse tenderness noted.  She was recently seen in the emergency department for abdominal pain.  CT scan ordered 5:54 AM CT imaging does not reveal any acute abdominal emergency. Labs revealed mild hyponatremia, but this is been noted before.  I do not feel this is an acute process. Patient is awake alert, no distress.  She is taking p.o.  Her abdomen is soft. Patient reports is that she is been going on for some time.  I do not feel she requires admission or further testing at this time.  Will refer to GI and general surgery. Final Clinical Impressions(s) / ED Diagnoses   Final diagnoses:  Generalized abdominal pain  Dehydration  Wheezing  Hernia of abdominal wall    ED Discharge Orders    None       Ripley Fraise, MD 07/25/17 [phone]

## 2017-07-25 NOTE — ED Notes (Signed)
[facility] put in place 

## 2017-07-25 NOTE — ED Notes (Signed)
Patient transported to CT 

## 2017-07-25 NOTE — ED Notes (Signed)
Pt reports upper abd pains for the last 6 months. Pt reports being diagnosed with IBS and she is not sure if she is on any medications for same.

## 2017-07-25 NOTE — ED Triage Notes (Addendum)
Pt. From [facility] via EMS with reports of abdominal pain and nausea X6 months. Pt. Also reports central chest pain that radiates to left arm. Pt. Denies vomiting and diarrhea. Pt. Reports having small hard bowel movement today and reports this to be a regular occurrence. Pt a/o x4.   CBG- 89   NAD at this time.

## 2017-07-30 ENCOUNTER — Other Ambulatory Visit: Payer: Self-pay

## 2017-07-30 ENCOUNTER — Inpatient Hospital Stay (HOSPITAL_COMMUNITY)
Admission: EM | Admit: 2017-07-30 | Discharge: 2017-08-12 | DRG: 641 | Disposition: A | Discharge status: Skilled Nursing Facility | Payer: Medicare Other | Attending: Family Medicine | Admitting: Family Medicine

## 2017-07-30 ENCOUNTER — Emergency Department (HOSPITAL_COMMUNITY): Payer: Medicare Other

## 2017-07-30 DIAGNOSIS — M25551 Pain in right hip: Secondary | ICD-10-CM | POA: Diagnosis not present

## 2017-07-30 DIAGNOSIS — D72829 Elevated white blood cell count, unspecified: Secondary | ICD-10-CM | POA: Diagnosis present

## 2017-07-30 DIAGNOSIS — I10 Essential (primary) hypertension: Secondary | ICD-10-CM | POA: Diagnosis present

## 2017-07-30 DIAGNOSIS — K56609 Unspecified intestinal obstruction, unspecified as to partial versus complete obstruction: Secondary | ICD-10-CM

## 2017-07-30 DIAGNOSIS — Z880 Allergy status to penicillin: Secondary | ICD-10-CM

## 2017-07-30 DIAGNOSIS — Z79899 Other long term (current) drug therapy: Secondary | ICD-10-CM

## 2017-07-30 DIAGNOSIS — E785 Hyperlipidemia, unspecified: Secondary | ICD-10-CM | POA: Diagnosis present

## 2017-07-30 DIAGNOSIS — Z881 Allergy status to other antibiotic agents status: Secondary | ICD-10-CM

## 2017-07-30 DIAGNOSIS — K219 Gastro-esophageal reflux disease without esophagitis: Secondary | ICD-10-CM | POA: Diagnosis present

## 2017-07-30 DIAGNOSIS — E871 Hypo-osmolality and hyponatremia: Principal | ICD-10-CM | POA: Diagnosis present

## 2017-07-30 DIAGNOSIS — Z6832 Body mass index (BMI) 32.0-32.9, adult: Secondary | ICD-10-CM

## 2017-07-30 DIAGNOSIS — F419 Anxiety disorder, unspecified: Secondary | ICD-10-CM | POA: Diagnosis present

## 2017-07-30 DIAGNOSIS — M810 Age-related osteoporosis without current pathological fracture: Secondary | ICD-10-CM | POA: Diagnosis present

## 2017-07-30 DIAGNOSIS — K56600 Partial intestinal obstruction, unspecified as to cause: Secondary | ICD-10-CM | POA: Diagnosis present

## 2017-07-30 DIAGNOSIS — K449 Diaphragmatic hernia without obstruction or gangrene: Secondary | ICD-10-CM | POA: Diagnosis present

## 2017-07-30 DIAGNOSIS — E876 Hypokalemia: Secondary | ICD-10-CM | POA: Diagnosis present

## 2017-07-30 DIAGNOSIS — K639 Disease of intestine, unspecified: Secondary | ICD-10-CM

## 2017-07-30 DIAGNOSIS — E119 Type 2 diabetes mellitus without complications: Secondary | ICD-10-CM | POA: Diagnosis present

## 2017-07-30 DIAGNOSIS — Z9981 Dependence on supplemental oxygen: Secondary | ICD-10-CM

## 2017-07-30 DIAGNOSIS — F17210 Nicotine dependence, cigarettes, uncomplicated: Secondary | ICD-10-CM | POA: Diagnosis present

## 2017-07-30 DIAGNOSIS — W010XXA Fall on same level from slipping, tripping and stumbling without subsequent striking against object, initial encounter: Secondary | ICD-10-CM | POA: Diagnosis present

## 2017-07-30 DIAGNOSIS — W19XXXA Unspecified fall, initial encounter: Secondary | ICD-10-CM

## 2017-07-30 DIAGNOSIS — F329 Major depressive disorder, single episode, unspecified: Secondary | ICD-10-CM | POA: Diagnosis present

## 2017-07-30 DIAGNOSIS — K5900 Constipation, unspecified: Secondary | ICD-10-CM | POA: Diagnosis present

## 2017-07-30 DIAGNOSIS — S32511A Fracture of superior rim of right pubis, initial encounter for closed fracture: Secondary | ICD-10-CM | POA: Diagnosis present

## 2017-07-30 DIAGNOSIS — H9221 Otorrhagia, right ear: Secondary | ICD-10-CM | POA: Diagnosis present

## 2017-07-30 DIAGNOSIS — Z23 Encounter for immunization: Secondary | ICD-10-CM

## 2017-07-30 DIAGNOSIS — J441 Chronic obstructive pulmonary disease with (acute) exacerbation: Secondary | ICD-10-CM | POA: Diagnosis present

## 2017-07-30 DIAGNOSIS — Z7982 Long term (current) use of aspirin: Secondary | ICD-10-CM

## 2017-07-30 DIAGNOSIS — E669 Obesity, unspecified: Secondary | ICD-10-CM | POA: Diagnosis present

## 2017-07-30 LAB — COMPREHENSIVE METABOLIC PANEL
ALT: 19 U/L (ref 14–54)
AST: 19 U/L (ref 15–41)
Albumin: 3.1 g/dL — ABNORMAL LOW (ref 3.5–5.0)
Alkaline Phosphatase: 73 U/L (ref 38–126)
Anion gap: 13 (ref 5–15)
CHLORIDE: 79 mmol/L — AB (ref 101–111)
CO2: 22 mmol/L (ref 22–32)
CREATININE: 0.38 mg/dL — AB (ref 0.44–1.00)
Calcium: 8.3 mg/dL — ABNORMAL LOW (ref 8.9–10.3)
GFR calc Af Amer: >60 mL/min (ref >60)
GFR calc non Af Amer: >60 mL/min (ref >60)
GLUCOSE: 141 mg/dL — AB (ref 65–99)
Potassium: 3.3 mmol/L — ABNORMAL LOW (ref 3.5–5.1)
SODIUM: 114 mmol/L — AB (ref 135–145)
Total Bilirubin: 0.6 mg/dL (ref 0.3–1.2)
Total Protein: 6.5 g/dL (ref 6.5–8.1)

## 2017-07-30 LAB — CBC
HCT: 32.6 % — ABNORMAL LOW (ref 36.0–46.0)
Hemoglobin: 11.4 g/dL — ABNORMAL LOW (ref 12.0–15.0)
MCH: 29.3 pg (ref 26.0–34.0)
MCHC: 35 g/dL (ref 30.0–36.0)
MCV: 83.8 fL (ref 78.0–100.0)
PLATELETS: 185 10*3/uL (ref 150–400)
RBC: 3.89 MIL/uL (ref 3.87–5.11)
RDW: 13.6 % (ref 11.5–15.5)
WBC: 12.9 10*3/uL — AB (ref 4.0–10.5)

## 2017-07-30 NOTE — ED Triage Notes (Signed)
Patient arrived via [facility] from [facility]. Per facility staff, patient fell onto her right side. She reports "right side pain". Fall occurred 20 minutes prior to EMS arrival. Patient is reporting shortness of breath. She states " give me IV  Pain to breath". She also reports that she has not had a bowel movement for a week.

## 2017-07-31 ENCOUNTER — Encounter (HOSPITAL_COMMUNITY): Payer: Self-pay | Admitting: Emergency Medicine

## 2017-07-31 ENCOUNTER — Emergency Department (HOSPITAL_COMMUNITY): Payer: Medicare Other

## 2017-07-31 DIAGNOSIS — F418 Other specified anxiety disorders: Secondary | ICD-10-CM | POA: Diagnosis not present

## 2017-07-31 DIAGNOSIS — I10 Essential (primary) hypertension: Secondary | ICD-10-CM

## 2017-07-31 DIAGNOSIS — R1110 Vomiting, unspecified: Secondary | ICD-10-CM | POA: Diagnosis not present

## 2017-07-31 DIAGNOSIS — H9221 Otorrhagia, right ear: Secondary | ICD-10-CM | POA: Diagnosis present

## 2017-07-31 DIAGNOSIS — K219 Gastro-esophageal reflux disease without esophagitis: Secondary | ICD-10-CM | POA: Diagnosis present

## 2017-07-31 DIAGNOSIS — Z23 Encounter for immunization: Secondary | ICD-10-CM | POA: Diagnosis present

## 2017-07-31 DIAGNOSIS — J441 Chronic obstructive pulmonary disease with (acute) exacerbation: Secondary | ICD-10-CM | POA: Diagnosis present

## 2017-07-31 DIAGNOSIS — Z880 Allergy status to penicillin: Secondary | ICD-10-CM | POA: Diagnosis not present

## 2017-07-31 DIAGNOSIS — E871 Hypo-osmolality and hyponatremia: Principal | ICD-10-CM

## 2017-07-31 DIAGNOSIS — M810 Age-related osteoporosis without current pathological fracture: Secondary | ICD-10-CM | POA: Diagnosis present

## 2017-07-31 DIAGNOSIS — W19XXXA Unspecified fall, initial encounter: Secondary | ICD-10-CM | POA: Diagnosis not present

## 2017-07-31 DIAGNOSIS — E669 Obesity, unspecified: Secondary | ICD-10-CM | POA: Diagnosis present

## 2017-07-31 DIAGNOSIS — Z6832 Body mass index (BMI) 32.0-32.9, adult: Secondary | ICD-10-CM | POA: Diagnosis not present

## 2017-07-31 DIAGNOSIS — Z9981 Dependence on supplemental oxygen: Secondary | ICD-10-CM | POA: Diagnosis not present

## 2017-07-31 DIAGNOSIS — E876 Hypokalemia: Secondary | ICD-10-CM | POA: Diagnosis present

## 2017-07-31 DIAGNOSIS — F419 Anxiety disorder, unspecified: Secondary | ICD-10-CM | POA: Diagnosis present

## 2017-07-31 DIAGNOSIS — F17210 Nicotine dependence, cigarettes, uncomplicated: Secondary | ICD-10-CM | POA: Diagnosis present

## 2017-07-31 DIAGNOSIS — E119 Type 2 diabetes mellitus without complications: Secondary | ICD-10-CM | POA: Diagnosis present

## 2017-07-31 DIAGNOSIS — F329 Major depressive disorder, single episode, unspecified: Secondary | ICD-10-CM | POA: Diagnosis present

## 2017-07-31 DIAGNOSIS — K56600 Partial intestinal obstruction, unspecified as to cause: Secondary | ICD-10-CM | POA: Diagnosis present

## 2017-07-31 DIAGNOSIS — E785 Hyperlipidemia, unspecified: Secondary | ICD-10-CM | POA: Diagnosis present

## 2017-07-31 DIAGNOSIS — K5900 Constipation, unspecified: Secondary | ICD-10-CM | POA: Diagnosis present

## 2017-07-31 DIAGNOSIS — Z881 Allergy status to other antibiotic agents status: Secondary | ICD-10-CM | POA: Diagnosis not present

## 2017-07-31 DIAGNOSIS — R109 Unspecified abdominal pain: Secondary | ICD-10-CM | POA: Diagnosis not present

## 2017-07-31 DIAGNOSIS — W010XXA Fall on same level from slipping, tripping and stumbling without subsequent striking against object, initial encounter: Secondary | ICD-10-CM | POA: Diagnosis not present

## 2017-07-31 DIAGNOSIS — M25551 Pain in right hip: Secondary | ICD-10-CM | POA: Diagnosis present

## 2017-07-31 DIAGNOSIS — D72829 Elevated white blood cell count, unspecified: Secondary | ICD-10-CM | POA: Diagnosis present

## 2017-07-31 DIAGNOSIS — K449 Diaphragmatic hernia without obstruction or gangrene: Secondary | ICD-10-CM | POA: Diagnosis present

## 2017-07-31 DIAGNOSIS — S32511A Fracture of superior rim of right pubis, initial encounter for closed fracture: Secondary | ICD-10-CM | POA: Diagnosis present

## 2017-07-31 LAB — CBC
HCT: 33.4 % — ABNORMAL LOW (ref 36.0–46.0)
Hemoglobin: 11.9 g/dL — ABNORMAL LOW (ref 12.0–15.0)
MCH: 29.8 pg (ref 26.0–34.0)
MCHC: 35.6 g/dL (ref 30.0–36.0)
MCV: 83.7 fL (ref 78.0–100.0)
PLATELETS: 196 10*3/uL (ref 150–400)
RBC: 3.99 MIL/uL (ref 3.87–5.11)
RDW: 13.5 % (ref 11.5–15.5)
WBC: 10 10*3/uL (ref 4.0–10.5)

## 2017-07-31 LAB — NA AND K (SODIUM & POTASSIUM), RAND UR
Potassium Urine: 27 mmol/L
SODIUM UR: 65 mmol/L

## 2017-07-31 LAB — BASIC METABOLIC PANEL
ANION GAP: 12 (ref 5–15)
ANION GAP: 10 (ref 5–15)
Anion gap: 12 (ref 5–15)
BUN: <5 mg/dL — ABNORMAL LOW (ref 6–20)
BUN: 8 mg/dL (ref 6–20)
CHLORIDE: 83 mmol/L — AB (ref 101–111)
CHLORIDE: 84 mmol/L — AB (ref 101–111)
CHLORIDE: 87 mmol/L — AB (ref 101–111)
CO2: 22 mmol/L (ref 22–32)
CO2: 20 mmol/L — AB (ref 22–32)
CO2: 23 mmol/L (ref 22–32)
Calcium: 8.6 mg/dL — ABNORMAL LOW (ref 8.9–10.3)
Calcium: 8.7 mg/dL — ABNORMAL LOW (ref 8.9–10.3)
Calcium: 8.7 mg/dL — ABNORMAL LOW (ref 8.9–10.3)
Creatinine, Ser: 0.4 mg/dL — ABNORMAL LOW (ref 0.44–1.00)
Creatinine, Ser: 0.48 mg/dL (ref 0.44–1.00)
Creatinine, Ser: 0.49 mg/dL (ref 0.44–1.00)
GFR calc Af Amer: >60 mL/min (ref >60)
GFR calc Af Amer: >60 mL/min (ref >60)
GFR calc Af Amer: >60 mL/min (ref >60)
GLUCOSE: 104 mg/dL — AB (ref 65–99)
GLUCOSE: 187 mg/dL — AB (ref 65–99)
Glucose, Bld: 154 mg/dL — ABNORMAL HIGH (ref 65–99)
POTASSIUM: 3.7 mmol/L (ref 3.5–5.1)
POTASSIUM: 4.4 mmol/L (ref 3.5–5.1)
POTASSIUM: 5.3 mmol/L — AB (ref 3.5–5.1)
Sodium: 117 mmol/L — CL (ref 135–145)
Sodium: 116 mmol/L — CL (ref 135–145)
Sodium: 120 mmol/L — ABNORMAL LOW (ref 135–145)

## 2017-07-31 LAB — GLUCOSE, CAPILLARY
GLUCOSE-CAPILLARY: 148 mg/dL — AB (ref 65–99)
Glucose-Capillary: 182 mg/dL — ABNORMAL HIGH (ref 65–99)

## 2017-07-31 LAB — I-STAT TROPONIN, ED: Troponin i, poc: 0.01 ng/mL (ref 0.00–0.08)

## 2017-07-31 LAB — URINALYSIS, ROUTINE W REFLEX MICROSCOPIC
BILIRUBIN URINE: NEGATIVE
Glucose, UA: 50 mg/dL — AB
Hgb urine dipstick: NEGATIVE
KETONES UR: 5 mg/dL — AB
LEUKOCYTES UA: NEGATIVE
Nitrite: NEGATIVE
PROTEIN: NEGATIVE mg/dL
Specific Gravity, Urine: 1.008 (ref 1.005–1.030)
pH: 7 (ref 5.0–8.0)

## 2017-07-31 LAB — CHLORIDE, URINE, RANDOM: CHLORIDE URINE: 71 mmol/L

## 2017-07-31 LAB — CREATININE, SERUM
Creatinine, Ser: 0.37 mg/dL — ABNORMAL LOW (ref 0.44–1.00)
GFR calc Af Amer: >60 mL/min (ref >60)
GFR calc non Af Amer: >60 mL/min (ref >60)

## 2017-07-31 LAB — CBG MONITORING, ED
GLUCOSE-CAPILLARY: 135 mg/dL — AB (ref 65–99)
GLUCOSE-CAPILLARY: 194 mg/dL — AB (ref 65–99)

## 2017-07-31 LAB — I-STAT CHEM 8, ED
BUN: <3 mg/dL — ABNORMAL LOW (ref 6–20)
CALCIUM ION: 1.06 mmol/L — AB (ref 1.15–1.40)
CHLORIDE: 77 mmol/L — AB (ref 101–111)
CREATININE: 0.3 mg/dL — AB (ref 0.44–1.00)
GLUCOSE: 123 mg/dL — AB (ref 65–99)
HCT: 36 % (ref 36.0–46.0)
Hemoglobin: 12.2 g/dL (ref 12.0–15.0)
POTASSIUM: 3.3 mmol/L — AB (ref 3.5–5.1)
Sodium: 115 mmol/L — CL (ref 135–145)
TCO2: 28 mmol/L (ref 22–32)

## 2017-07-31 LAB — MAGNESIUM: Magnesium: 1.4 mg/dL — ABNORMAL LOW (ref 1.7–2.4)

## 2017-07-31 LAB — MRSA PCR SCREENING: MRSA BY PCR: NEGATIVE

## 2017-07-31 LAB — TSH: TSH: 0.808 u[IU]/mL (ref 0.350–4.500)

## 2017-07-31 LAB — OSMOLALITY, URINE: Osmolality, Ur: 275 mOsm/kg — ABNORMAL LOW (ref 300–900)

## 2017-07-31 LAB — HIV ANTIBODY (ROUTINE TESTING W REFLEX): HIV Screen 4th Generation wRfx: NONREACTIVE

## 2017-07-31 LAB — BRAIN NATRIURETIC PEPTIDE: B NATRIURETIC PEPTIDE 5: 88.1 pg/mL (ref 0.0–100.0)

## 2017-07-31 MED ORDER — FLUTICASONE PROPIONATE 50 MCG/ACT NA SUSP
2.0000 | Freq: Two times a day (BID) | NASAL | Status: DC
  Administered 2017-07-31 – 2017-08-12 (×21): 2 via NASAL
  Filled 2017-07-31 (×2): qty 16

## 2017-07-31 MED ORDER — BUPROPION HCL ER (XL) 150 MG PO TB24
150.0000 mg | ORAL_TABLET | Freq: Every day | ORAL | Status: DC
  Administered 2017-07-31 – 2017-08-12 (×11): 150 mg via ORAL
  Filled 2017-07-31 (×14): qty 1

## 2017-07-31 MED ORDER — SODIUM CHLORIDE 0.9 % IV SOLN
INTRAVENOUS | Status: DC
  Administered 2017-07-31 – 2017-08-02 (×4): via INTRAVENOUS

## 2017-07-31 MED ORDER — PAROXETINE HCL 20 MG PO TABS
40.0000 mg | ORAL_TABLET | Freq: Every day | ORAL | Status: DC
  Administered 2017-07-31 – 2017-08-12 (×11): 40 mg via ORAL
  Filled 2017-07-31 (×13): qty 2

## 2017-07-31 MED ORDER — INSULIN ASPART 100 UNIT/ML ~~LOC~~ SOLN
0.0000 [IU] | Freq: Every day | SUBCUTANEOUS | Status: DC
  Administered 2017-08-11: 2 [IU] via SUBCUTANEOUS

## 2017-07-31 MED ORDER — NIFEDIPINE ER OSMOTIC RELEASE 90 MG PO TB24
90.0000 mg | ORAL_TABLET | Freq: Every day | ORAL | Status: DC
  Administered 2017-07-31 – 2017-08-02 (×3): 90 mg via ORAL
  Filled 2017-07-31 (×6): qty 1

## 2017-07-31 MED ORDER — HYDROCODONE-ACETAMINOPHEN 5-325 MG PO TABS
1.0000 | ORAL_TABLET | Freq: Three times a day (TID) | ORAL | Status: DC | PRN
  Administered 2017-07-31 – 2017-08-03 (×5): 1 via ORAL
  Filled 2017-07-31 (×6): qty 1

## 2017-07-31 MED ORDER — MAGNESIUM SULFATE 2 GM/50ML IV SOLN
2.0000 g | Freq: Once | INTRAVENOUS | Status: AC
  Administered 2017-07-31: 2 g via INTRAVENOUS
  Filled 2017-07-31: qty 50

## 2017-07-31 MED ORDER — GUAIFENESIN 100 MG/5ML PO SYRP
200.0000 mg | ORAL_SOLUTION | Freq: Three times a day (TID) | ORAL | Status: DC | PRN
  Administered 2017-08-03: 200 mg via ORAL
  Filled 2017-07-31 (×2): qty 10

## 2017-07-31 MED ORDER — METHYLPREDNISOLONE SODIUM SUCC 40 MG IJ SOLR
40.0000 mg | Freq: Four times a day (QID) | INTRAMUSCULAR | Status: DC
  Administered 2017-07-31 – 2017-08-05 (×21): 40 mg via INTRAVENOUS
  Filled 2017-07-31 (×22): qty 1

## 2017-07-31 MED ORDER — MAGNESIUM HYDROXIDE 400 MG/5ML PO SUSP: 30.0000 mL | Freq: Every day | ORAL | Status: DC | PRN

## 2017-07-31 MED ORDER — HYDRALAZINE HCL 20 MG/ML IJ SOLN
10.0000 mg | INTRAMUSCULAR | Status: DC | PRN
  Administered 2017-08-02 – 2017-08-06 (×5): 10 mg via INTRAVENOUS
  Filled 2017-07-31 (×5): qty 1

## 2017-07-31 MED ORDER — CLONIDINE HCL 0.3 MG/24HR TD PTWK
0.3000 mg | MEDICATED_PATCH | TRANSDERMAL | Status: DC
  Administered 2017-08-02 – 2017-08-09 (×2): 0.3 mg via TRANSDERMAL
  Filled 2017-07-31 (×2): qty 1

## 2017-07-31 MED ORDER — FERROUS SULFATE 325 (65 FE) MG PO TABS
325.0000 mg | ORAL_TABLET | Freq: Every day | ORAL | Status: DC
  Administered 2017-07-31 – 2017-08-12 (×10): 325 mg via ORAL
  Filled 2017-07-31 (×12): qty 1

## 2017-07-31 MED ORDER — HEPARIN SODIUM (PORCINE) 5000 UNIT/ML IJ SOLN
5000.0000 [IU] | Freq: Three times a day (TID) | INTRAMUSCULAR | Status: DC
  Administered 2017-07-31 – 2017-08-12 (×33): 5000 [IU] via SUBCUTANEOUS
  Filled 2017-07-31 (×35): qty 1

## 2017-07-31 MED ORDER — LURASIDONE HCL 40 MG PO TABS
80.0000 mg | ORAL_TABLET | Freq: Every day | ORAL | Status: DC
  Administered 2017-07-31 – 2017-08-12 (×11): 80 mg via ORAL
  Filled 2017-07-31 (×9): qty 2
  Filled 2017-07-31: qty 1
  Filled 2017-07-31 (×3): qty 2
  Filled 2017-07-31: qty 1

## 2017-07-31 MED ORDER — BIOTENE DRY MOUTH MT LIQD: 2.0000 mL | OROMUCOSAL | Status: DC | PRN

## 2017-07-31 MED ORDER — KETOROLAC TROMETHAMINE 30 MG/ML IJ SOLN
30.0000 mg | Freq: Once | INTRAMUSCULAR | Status: AC
  Administered 2017-07-31: 30 mg via INTRAVENOUS
  Filled 2017-07-31: qty 1

## 2017-07-31 MED ORDER — INSULIN ASPART 100 UNIT/ML ~~LOC~~ SOLN
0.0000 [IU] | Freq: Three times a day (TID) | SUBCUTANEOUS | Status: DC
  Administered 2017-07-31 (×2): 2 [IU] via SUBCUTANEOUS
  Administered 2017-08-01: 1 [IU] via SUBCUTANEOUS
  Administered 2017-08-01: 2 [IU] via SUBCUTANEOUS
  Administered 2017-08-01 – 2017-08-02 (×3): 1 [IU] via SUBCUTANEOUS
  Administered 2017-08-02 – 2017-08-03 (×2): 2 [IU] via SUBCUTANEOUS
  Administered 2017-08-03 – 2017-08-06 (×9): 1 [IU] via SUBCUTANEOUS
  Administered 2017-08-07 (×2): 2 [IU] via SUBCUTANEOUS
  Administered 2017-08-09 – 2017-08-11 (×3): 1 [IU] via SUBCUTANEOUS
  Filled 2017-07-31: qty 1

## 2017-07-31 MED ORDER — CARVEDILOL 25 MG PO TABS
25.0000 mg | ORAL_TABLET | Freq: Two times a day (BID) | ORAL | Status: DC
  Administered 2017-07-31 – 2017-08-03 (×7): 25 mg via ORAL
  Filled 2017-07-31 (×7): qty 1

## 2017-07-31 MED ORDER — PROMETHAZINE HCL 25 MG PO TABS
25.0000 mg | ORAL_TABLET | Freq: Four times a day (QID) | ORAL | Status: DC | PRN
  Administered 2017-08-03 (×2): 25 mg via ORAL
  Filled 2017-07-31 (×2): qty 1

## 2017-07-31 MED ORDER — ONDANSETRON HCL 4 MG/2ML IJ SOLN
4.0000 mg | Freq: Four times a day (QID) | INTRAMUSCULAR | Status: DC | PRN
  Administered 2017-07-31 – 2017-08-06 (×4): 4 mg via INTRAVENOUS
  Filled 2017-07-31 (×4): qty 2

## 2017-07-31 MED ORDER — DIVALPROEX SODIUM ER 500 MG PO TB24
500.0000 mg | ORAL_TABLET | Freq: Two times a day (BID) | ORAL | Status: DC
  Administered 2017-07-31 – 2017-08-03 (×7): 500 mg via ORAL
  Filled 2017-07-31 (×8): qty 1

## 2017-07-31 MED ORDER — MELATONIN 3 MG PO TABS
9.0000 mg | ORAL_TABLET | Freq: Every day | ORAL | Status: DC
  Administered 2017-08-01 – 2017-08-02 (×3): 9 mg via ORAL
  Filled 2017-07-31 (×3): qty 3

## 2017-07-31 MED ORDER — HYDRALAZINE HCL 25 MG PO TABS
100.0000 mg | ORAL_TABLET | Freq: Three times a day (TID) | ORAL | Status: DC
  Administered 2017-07-31 – 2017-08-03 (×11): 100 mg via ORAL
  Filled 2017-07-31 (×11): qty 4

## 2017-07-31 MED ORDER — BISACODYL 5 MG PO TBEC
5.0000 mg | DELAYED_RELEASE_TABLET | Freq: Every day | ORAL | Status: DC | PRN
  Administered 2017-07-31 – 2017-08-03 (×2): 5 mg via ORAL
  Filled 2017-07-31 (×2): qty 1

## 2017-07-31 MED ORDER — SUCRALFATE 1 G PO TABS
1.0000 g | ORAL_TABLET | Freq: Four times a day (QID) | ORAL | Status: DC
  Administered 2017-07-31 – 2017-08-12 (×39): 1 g via ORAL
  Filled 2017-07-31 (×39): qty 1

## 2017-07-31 MED ORDER — PRAVASTATIN SODIUM 20 MG PO TABS
20.0000 mg | ORAL_TABLET | Freq: Every day | ORAL | Status: DC
  Administered 2017-07-31 – 2017-08-12 (×11): 20 mg via ORAL
  Filled 2017-07-31 (×12): qty 1

## 2017-07-31 MED ORDER — KETOROLAC TROMETHAMINE 15 MG/ML IJ SOLN
15.0000 mg | Freq: Four times a day (QID) | INTRAMUSCULAR | Status: AC | PRN
  Administered 2017-07-31 – 2017-08-03 (×9): 15 mg via INTRAVENOUS
  Filled 2017-07-31 (×9): qty 1

## 2017-07-31 MED ORDER — LOPERAMIDE HCL 2 MG PO CAPS: 2.0000 mg | ORAL_CAPSULE | Freq: Every day | ORAL | Status: DC | PRN

## 2017-07-31 MED ORDER — ONDANSETRON HCL 4 MG PO TABS: 4.0000 mg | ORAL_TABLET | Freq: Four times a day (QID) | ORAL | Status: DC | PRN

## 2017-07-31 MED ORDER — FLUTICASONE FUROATE-VILANTEROL 100-25 MCG/INH IN AEPB
1.0000 | INHALATION_SPRAY | Freq: Every day | RESPIRATORY_TRACT | Status: DC
Start: 2017-07-31 — End: 2017-08-12
  Administered 2017-07-31 – 2017-08-12 (×13): 1 via RESPIRATORY_TRACT
  Filled 2017-07-31 (×2): qty 28

## 2017-07-31 MED ORDER — IBUPROFEN 600 MG PO TABS
600.0000 mg | ORAL_TABLET | Freq: Four times a day (QID) | ORAL | Status: DC | PRN
  Administered 2017-08-02: 600 mg via ORAL
  Filled 2017-07-31: qty 1

## 2017-07-31 MED ORDER — SODIUM CHLORIDE 0.9 % IV SOLN
INTRAVENOUS | Status: DC
  Administered 2017-07-31: 03:00:00 via INTRAVENOUS

## 2017-07-31 MED ORDER — ACETAMINOPHEN 325 MG PO TABS: 650.0000 mg | ORAL_TABLET | Freq: Four times a day (QID) | ORAL | Status: DC | PRN

## 2017-07-31 MED ORDER — ALUM & MAG HYDROXIDE-SIMETH 200-200-20 MG/5ML PO SUSP
30.0000 mL | Freq: Four times a day (QID) | ORAL | Status: DC | PRN
  Administered 2017-07-31: 30 mL via ORAL
  Filled 2017-07-31: qty 30

## 2017-07-31 MED ORDER — IPRATROPIUM-ALBUTEROL 0.5-2.5 (3) MG/3ML IN SOLN
3.0000 mL | RESPIRATORY_TRACT | Status: DC | PRN
  Administered 2017-07-31 – 2017-08-12 (×4): 3 mL via RESPIRATORY_TRACT
  Filled 2017-07-31 (×3): qty 3

## 2017-07-31 MED ORDER — INFLUENZA VAC SPLIT QUAD 0.5 ML IM SUSY
0.5000 mL | PREFILLED_SYRINGE | INTRAMUSCULAR | Status: AC
  Administered 2017-08-01: 0.5 mL via INTRAMUSCULAR

## 2017-07-31 MED ORDER — IPRATROPIUM-ALBUTEROL 0.5-2.5 (3) MG/3ML IN SOLN
3.0000 mL | Freq: Once | RESPIRATORY_TRACT | Status: AC
  Administered 2017-07-31: 3 mL via RESPIRATORY_TRACT
  Filled 2017-07-31: qty 3

## 2017-07-31 MED ORDER — PANTOPRAZOLE SODIUM 40 MG PO TBEC
40.0000 mg | DELAYED_RELEASE_TABLET | Freq: Every day | ORAL | Status: DC
  Administered 2017-07-31 – 2017-08-03 (×4): 40 mg via ORAL
  Filled 2017-07-31 (×5): qty 1

## 2017-07-31 MED ORDER — ALPRAZOLAM 0.5 MG PO TABS
0.5000 mg | ORAL_TABLET | Freq: Three times a day (TID) | ORAL | Status: DC
  Administered 2017-07-31 – 2017-08-03 (×10): 0.5 mg via ORAL
  Filled 2017-07-31 (×10): qty 1

## 2017-07-31 MED ORDER — POTASSIUM CHLORIDE 10 MEQ/100ML IV SOLN
10.0000 meq | INTRAVENOUS | Status: AC
Start: 2017-07-31 — End: 2017-07-31
  Administered 2017-07-31 (×4): 10 meq via INTRAVENOUS
  Filled 2017-07-31 (×4): qty 100

## 2017-07-31 MED ORDER — POLYETHYLENE GLYCOL 3350 17 G PO PACK
17.0000 g | PACK | Freq: Every day | ORAL | Status: DC
  Administered 2017-07-31 – 2017-08-03 (×4): 17 g via ORAL
  Filled 2017-07-31 (×4): qty 1

## 2017-07-31 MED ORDER — ASPIRIN EC 81 MG PO TBEC
81.0000 mg | DELAYED_RELEASE_TABLET | Freq: Every day | ORAL | Status: DC
  Administered 2017-08-01 – 2017-08-12 (×10): 81 mg via ORAL
  Filled 2017-07-31 (×10): qty 1

## 2017-07-31 MED ORDER — ADULT MULTIVITAMIN W/MINERALS CH
ORAL_TABLET | Freq: Every day | ORAL | Status: DC
  Administered 2017-07-31 – 2017-08-02 (×4): 1 via ORAL
  Filled 2017-07-31 (×3): qty 1

## 2017-07-31 MED ORDER — DOCUSATE SODIUM 100 MG PO CAPS
100.0000 mg | ORAL_CAPSULE | Freq: Two times a day (BID) | ORAL | Status: DC
  Administered 2017-07-31 – 2017-08-03 (×7): 100 mg via ORAL
  Filled 2017-07-31 (×7): qty 1

## 2017-07-31 MED ORDER — SODIUM CHLORIDE 1 G PO TABS
1.0000 g | ORAL_TABLET | Freq: Three times a day (TID) | ORAL | Status: DC
  Administered 2017-07-31 – 2017-08-02 (×6): 1 g via ORAL
  Filled 2017-07-31 (×11): qty 1

## 2017-07-31 MED ORDER — IPRATROPIUM-ALBUTEROL 0.5-2.5 (3) MG/3ML IN SOLN
3.0000 mL | Freq: Four times a day (QID) | RESPIRATORY_TRACT | Status: DC
  Administered 2017-07-31 (×2): 3 mL via RESPIRATORY_TRACT
  Filled 2017-07-31 (×5): qty 3

## 2017-07-31 NOTE — ED Notes (Signed)
Patient transported to X-ray 

## 2017-07-31 NOTE — ED Notes (Signed)
Pt on 2L [place].  

## 2017-07-31 NOTE — Progress Notes (Signed)
CRITICAL VALUE ALERT  Critical Value:  Sodium 116  Date & Time Notied:  07/31/17 16:27  Provider Notified:  Dr. Ree Kida  Orders Received/Actions taken:

## 2017-07-31 NOTE — ED Notes (Signed)
Provider notified of critical NA level

## 2017-07-31 NOTE — ED Notes (Signed)
Admitting MD at bedside.

## 2017-07-31 NOTE — ED Notes (Signed)
Pt asked for pain medications however, she is unable to stay awake to receive it.

## 2017-07-31 NOTE — Evaluation (Signed)
Physical Therapy Evaluation Patient Details Name: Natalie Thomas MRN: [id number] DOB: [birth year] Today's Date: 07/31/2017   History of Present Illness  Pt is a 57 y/o female admitted secondary to increased SOB and hyponatremia. Pt also reports fall which resulted in R hip pain. Imaging for R hip negative for acute abnormality. CT of head/spine negative for acute abnormality. PMH includes CHF, HTN, COPD on 2L, DM, and osteoporosis.   Clinical Impression  Pt admitted secondary to problem above with deficits below. Pt with increased R hip pain, so mobility limited to rolling for clean up following incontinence. Pt at high fall risk and reports her plan is to go back to [place]. Will continue to follow acutely to maximize functional mobility independence and safety.     Follow Up Recommendations SNF;Supervision/Assistance - 24 hour    Equipment Recommendations  None recommended by PT    Recommendations for Other Services       Precautions / Restrictions Precautions Precautions: Fall Precaution Comments: Pt reports fall from [facility] after not locking her brakes on her WC.  Restrictions Weight Bearing Restrictions: No      Mobility  Bed Mobility Overal bed mobility: Needs Assistance Bed Mobility: Rolling Rolling: Min assist         General bed mobility comments: Pt with increased pain with rolling from side to side for clean up following incontinence in bed. Rolled to L/R X 2 and required min A. Pt refusing further mobility secondary to pain.   Transfers                 General transfer comment: Pt refusing secondary to pain.   Ambulation/Gait                Stairs            Wheelchair Mobility    Modified Rankin (Stroke Patients Only)       Balance                                             Pertinent Vitals/Pain Pain Assessment: 0-10 Pain Score: 8  Pain Location: R hip/groin  Pain Descriptors / Indicators: Sharp Pain  Intervention(s): Limited activity within patient's tolerance;Monitored during session;Repositioned    Home Living Family/patient expects to be discharged to:: Skilled nursing facility                 Additional Comments: Return to [employer]    Prior Function Level of Independence: Needs assistance   Gait / Transfers Assistance Needed: Reports using WC for mobility and reports she was sometimes able to transfer. Had started working with PT on walking.   ADL's / Homemaking Assistance Needed: Needed assist for ADLs        Hand Dominance        Extremity/Trunk Assessment   Upper Extremity Assessment Upper Extremity Assessment: Generalized weakness    Lower Extremity Assessment Lower Extremity Assessment: Generalized weakness;RLE deficits/detail RLE Deficits / Details: Limited in movement secondary to pain  RLE: Unable to fully assess due to pain    Cervical / Trunk Assessment Cervical / Trunk Assessment: Kyphotic  Communication   Communication: No difficulties  Cognition Arousal/Alertness: Awake/alert Behavior During Therapy: WFL for tasks assessed/performed Overall Cognitive Status: Within Functional Limits for tasks assessed  General Comments      Exercises     Assessment/Plan    PT Assessment Patient needs continued PT services  PT Problem List Decreased strength;Decreased range of motion;Decreased activity tolerance;Decreased balance;Decreased mobility;Decreased knowledge of use of DME;Decreased knowledge of precautions;Pain       PT Treatment Interventions DME instruction;Therapeutic activities;Gait training;Therapeutic exercise;Stair training;Balance training;Functional mobility training;Neuromuscular re-education;Patient/family education;Wheelchair mobility training    PT Goals (Current goals can be found in the Care Plan section)  Acute Rehab PT Goals Patient Stated Goal: to decrease pain   PT Goal Formulation: With patient Time For Goal Achievement: 08/14/17 Potential to Achieve Goals: Fair    Frequency Min 2X/week   Barriers to discharge        Co-evaluation               AM-PAC PT "6 Clicks" Daily Activity  Outcome Measure Difficulty turning over in bed (including adjusting bedclothes, sheets and blankets)?: Unable Difficulty moving from lying on back to sitting on the side of the bed? : Unable Difficulty sitting down on and standing up from a chair with arms (e.g., wheelchair, bedside commode, etc,.)?: Unable Help needed moving to and from a bed to chair (including a wheelchair)?: Total Help needed walking in hospital room?: Total Help needed climbing 3-5 steps with a railing? : Total 6 Click Score: 6    End of Session   Activity Tolerance: Patient limited by pain Patient left: in bed;with call bell/phone within reach Nurse Communication: Mobility status PT Visit Diagnosis: Other abnormalities of gait and mobility (R26.89);Muscle weakness (generalized) (M62.81);Pain Pain - Right/Left: Right Pain - part of body: Hip    Time: 0340-3524 PT Time Calculation (min) (ACUTE ONLY): 24 min   Charges:   PT Evaluation $PT Eval Moderate Complexity: 1 Mod PT Treatments $Therapeutic Activity: 8-22 mins   PT G Codes:        Leighton Ruff, PT, DPT  Acute Rehabilitation Services  Pager: [phone]   Rudean Hitt 07/31/2017, 1:44 PM

## 2017-07-31 NOTE — H&P (Addendum)
History and Physical    Natalie Thomas WGN:[id number] DOB: [birth year] DOA: 07/30/2017  PCP: Renato Shin, MD Patient coming from: [facility]  Chief Complaint: Fall and hip pain  HPI: Natalie Thomas is a 57 y.o. female with medical history significant of COPD, GERD, depression, hypertension came to the ER with complaints of right hip pain after fall.  Patient states that yesterday afternoon while transferring herself from the wheelchair she ended up sustaining a fall on the right side hurting her right hip.  She denies any loss of consciousness or any other warning signs prior to the fall.  She states it was purely mechanical fall as she lost her balance.  Since then she has had right-sided hip pain.  During this time she also states she has noticed her shortness of breath has worsened and has some coughing but unable to bring up any sputum.  Denies any fevers, chills and other complaints.  She has not had a bowel movement in over a week now but thinks this may be due to poor p.o. appetite.  In the ER patient was noted to have pain in the right hip and nonspecific abdominal pain.  Abdominal x-ray showed large amount of stool in the hip appeared okay.  Her labs suggested sodium of 115 with hypokalemia.  She had diffuse abnormal lung sounds concerning for COPD.  It was determined to admit her for further care and workup.   Review of Systems: As per HPI otherwise 10 point review of systems negative.   Past Medical History:  Diagnosis Date  . CHF (congestive heart failure) ([place])   . COPD (chronic obstructive pulmonary disease) ([place])   . Depression   . Diabetes mellitus, type 2 ([place])    pt denies but states that she has been treated for DM  . GERD (gastroesophageal reflux disease)   . Hypertension   . Osteoporosis     Past Surgical History:  Procedure Laterality Date  . EYE SURGERY     on left  eye, pt states that she sees bad out of the right eye and needs surgery there  . PELVIC  FRACTURE SURGERY       reports that she has been smoking cigarettes.  She has been smoking about 0.25 packs per day. she has never used smokeless tobacco. She reports that she does not drink alcohol or use drugs.  Allergies  Allergen Reactions  . Clarithromycin Itching  . Penicillins Itching    No family history on file.   Prior to Admission medications   Medication Sig Start Date End Date Taking? Authorizing Provider  acetaminophen (TYLENOL) 325 MG tablet Take 650 mg by mouth every 6 (six) hours as needed for mild pain.   Yes [provider]  albuterol (PROVENTIL HFA;VENTOLIN HFA) 108 (90 Base) MCG/ACT inhaler Inhale 2 puffs into the lungs every 4 (four) hours as needed for wheezing or shortness of breath. 07/05/17  Yes Ward, Delice Bison, DO  ALPRAZolam (XANAX) 0.5 MG tablet Take 0.5 mg by mouth 3 (three) times daily.   Yes [provider]  alum & mag hydroxide-simeth ([place]) 200-200-20 MG/5ML suspension Take 30 mLs by mouth every 6 (six) hours as needed for indigestion or heartburn.   Yes [provider]  antiseptic oral rinse (BIOTENE) LIQD 2 mLs by Mouth Rinse route as needed for dry mouth.   Yes [provider]  aspirin 81 MG tablet Take 1 tablet (81 mg total) by mouth daily. For heart health 08/06/11  Yes Greig Castilla, FNP  buPROPion (WELLBUTRIN XL) 150 MG 24 hr tablet Take 150 mg by mouth daily.   Yes [provider]  carvedilol (COREG) 25 MG tablet Take 25 mg by mouth 2 (two) times daily with a meal.   Yes [provider]  cloNIDine (CATAPRES - DOSED IN MG/24 HR) 0.3 mg/24hr patch Place 0.3 mg onto the skin once a week.   Yes [provider]  divalproex (DEPAKOTE ER) 500 MG 24 hr tablet Take 500 mg by mouth 2 (two) times daily.  08/06/11  Yes Greig Castilla, FNP  ferrous sulfate 325 (65 FE) MG tablet Take 325 mg by mouth daily with breakfast.   Yes [provider]  fluticasone (FLONASE) 50 MCG/ACT nasal  spray Place 2 sprays into both nostrils 2 (two) times daily.   Yes [provider]  fluticasone furoate-vilanterol (BREO ELLIPTA) 100-25 MCG/INH AEPB Inhale 1 puff into the lungs daily.   Yes [provider]  furosemide (LASIX) 20 MG tablet Take 1 tablet (20 mg total) by mouth daily. For fluid. 08/06/11 07/31/17 Yes Greig Castilla, FNP  guaifenesin (ROBITUSSIN) 100 MG/5ML syrup Take 200 mg by mouth 3 (three) times daily as needed for cough.   Yes [provider]  hydrALAZINE (APRESOLINE) 100 MG tablet Take 100 mg by mouth every 8 (eight) hours.   Yes [provider]  HYDROcodone-acetaminophen (NORCO/VICODIN) 5-325 MG tablet Take 1 tablet by mouth every 8 (eight) hours as needed for moderate pain.   Yes [provider]  ipratropium-albuterol (DUONEB) 0.5-2.5 (3) MG/3ML SOLN Take 3 mLs by nebulization 3 (three) times daily as needed (SOb).   Yes [provider]  loperamide (IMODIUM) 2 MG capsule Take 2 mg by mouth daily as needed for diarrhea or loose stools.   Yes [provider]  lurasidone (LATUDA) 80 MG TABS tablet Take 80 mg by mouth daily with breakfast.   Yes [provider]  magnesium hydroxide (MILK OF MAGNESIA) 400 MG/5ML suspension Take 30 mLs by mouth at bedtime as needed for mild constipation.   Yes [provider]  Melatonin 10 MG TABS Take 10 mg by mouth at bedtime.   Yes [provider]  Multiple Vitamin (DAILY-VITE PO) Take 1 tablet by mouth daily.   Yes [provider]  neomycin-bacitracin-polymyxin (NEOSPORIN) OINT Apply 1 application topically daily as needed for wound care.   Yes [provider]  NIFEdipine (PROCARDIA XL/ADALAT-CC) 90 MG 24 hr tablet Take 90 mg by mouth daily.   Yes [provider]  omeprazole (PRILOSEC) 20 MG capsule Take 1 capsule (20 mg total) by mouth daily. For acid reflux. Patient taking differently: Take 20 mg by mouth 2 (two) times daily  before a meal. For acid reflux. 08/06/11  Yes Greig Castilla, FNP  PARoxetine (PAXIL) 20 MG tablet Take 40 mg by mouth daily.   Yes [provider]  polyethylene glycol (MIRALAX) packet Take 17 g by mouth daily. 07/17/17  Yes Horton, Barbette Hair, MD  pravastatin (PRAVACHOL) 20 MG tablet Take 20 mg by mouth daily.   Yes [provider]  promethazine (PHENERGAN) 25 MG tablet Take 25 mg by mouth every 6 (six) hours as needed for nausea or vomiting.   Yes [provider]  sodium chloride 1 g tablet Take 1 g by mouth 3 (three) times daily with meals.   Yes [provider]  sucralfate (CARAFATE) 1 g tablet Take 1 g by mouth 4 (four)  times daily.   Yes [provider]  tiotropium (SPIRIVA) 18 MCG inhalation capsule Place 18 mcg into inhaler and inhale daily.   Yes [provider]    Physical Exam: Vitals:   07/31/17 0100 07/31/17 0130 07/31/17 0200 07/31/17 0300  BP: (!) 204/89 (!) 184/84 (!) 200/178 (!) 180/70  Pulse: 84 85 82 86  Resp: 18 20 20 18   Temp:      SpO2: 96% 96% 97% 99%      Constitutional: NAD, calm, comfortable, appears older than his stated age Vitals:   07/31/17 0100 07/31/17 0130 07/31/17 0200 07/31/17 0300  BP: (!) 204/89 (!) 184/84 (!) 200/178 (!) 180/70  Pulse: 84 85 82 86  Resp: 18 20 20 18   Temp:      SpO2: 96% 96% 97% 99%   Eyes: PERRL, lids and conjunctivae normal ENMT: Mucous membranes are moist. Posterior pharynx clear of any exudate or lesions.Normal dentition.  Neck: normal, supple, no masses, no thyromegaly Respiratory: Diffuse diminished breath sounds Cardiovascular: Regular rate and rhythm, no murmurs / rubs / gallops. No extremity edema. 2+ pedal pulses. No carotid bruits.  Abdomen: no tenderness, no masses palpated. No hepatosplenomegaly. Bowel sounds positive.  Musculoskeletal: no clubbing / cyanosis. No joint deformity upper and lower extremities. Good ROM, no contractures. Normal muscle tone.    Skin: no rashes, lesions, ulcers. No induration Neurologic: CN 2-12 grossly intact. Sensation intact, DTR normal. Strength 5/5 in all 4.  Psychiatric: Normal judgment and insight. Alert and oriented x 3. Normal mood.     Labs on Admission: I have personally reviewed following labs and imaging studies  CBC: Recent Labs  Lab 07/25/17 0149 07/30/17 2105 07/31/17 0205  WBC 8.0 12.9*  --   HGB 11.9* 11.4* 12.2  HCT 35.1* 32.6* 36.0  MCV 88.0 83.8  --   PLT 199 185  --    Basic Metabolic Panel: Recent Labs  Lab 07/25/17 0149 07/30/17 2105 07/31/17 0205  NA 127* 114* 115*  K 4.1 3.3* 3.3*  CL 92* 79* 77*  CO2 24 22  --   GLUCOSE 104* 141* 123*  BUN <5* <5* <3*  CREATININE 0.48 0.38* 0.30*  CALCIUM 8.8* 8.3*  --    GFR: Estimated Creatinine Clearance: 83.8 mL/min (A) (by C-G formula based on SCr of 0.3 mg/dL (L)). Liver Function Tests: Recent Labs  Lab 07/25/17 0149 07/30/17 2105  AST 16 19  ALT 15 19  ALKPHOS 67 73  BILITOT 0.3 0.6  PROT 6.5 6.5  ALBUMIN 3.2* 3.1*   Recent Labs  Lab 07/25/17 0149  LIPASE 24   No results for input(s): AMMONIA in the last 168 hours. Coagulation Profile: No results for input(s): INR, PROTIME in the last 168 hours. Cardiac Enzymes: No results for input(s): CKTOTAL, CKMB, CKMBINDEX, TROPONINI in the last 168 hours. BNP (last 3 results) No results for input(s): PROBNP in the last 8760 hours. HbA1C: No results for input(s): HGBA1C in the last 72 hours. CBG: Recent Labs  Lab 07/25/17 0155  GLUCAP 107*   Lipid Profile: No results for input(s): CHOL, HDL, LDLCALC, TRIG, CHOLHDL, LDLDIRECT in the last 72 hours. Thyroid Function Tests: No results for input(s): TSH, T4TOTAL, FREET4, T3FREE, THYROIDAB in the last 72 hours. Anemia Panel: No results for input(s): VITAMINB12, FOLATE, FERRITIN, TIBC, IRON, RETICCTPCT in the last 72 hours. Urine analysis:    Component Value Date/Time   COLORURINE YELLOW 07/31/2017 0114    APPEARANCEUR CLEAR 07/31/2017 0114   APPEARANCEUR Clear 06/22/2012  0618   LABSPEC 1.008 07/31/2017 0114   LABSPEC 1.003 06/22/2012 0618   PHURINE 7.0 07/31/2017 0114   GLUCOSEU 50 (A) 07/31/2017 0114   GLUCOSEU Negative 06/22/2012 0618   GLUCOSEU NEGATIVE 04/09/2011 1058   HGBUR NEGATIVE 07/31/2017 0114   BILIRUBINUR NEGATIVE 07/31/2017 0114   BILIRUBINUR Negative 06/22/2012 0618   KETONESUR 5 (A) 07/31/2017 0114   PROTEINUR NEGATIVE 07/31/2017 0114   UROBILINOGEN 1.0 08/18/2011 1805   NITRITE NEGATIVE 07/31/2017 0114   LEUKOCYTESUR NEGATIVE 07/31/2017 0114   LEUKOCYTESUR Trace 06/22/2012 0618   Sepsis Labs: !!!!!!!!!!!!!!!!!!!!!!!!!!!!!!!!!!!!!!!!!!!! @LABRCNTIP (procalcitonin:4,lacticidven:4) )No results found for this or any previous visit (from the past 240 hour(s)).   Radiological Exams on Admission: Dg Chest 2 View  Result Date: 07/30/2017 CLINICAL DATA:  Shortness of breath and chest tightness. EXAM: CHEST  2 VIEW COMPARISON:  July 25, 2017 FINDINGS: Stable cardiomegaly. The hila, mediastinum, lungs, and pleura are otherwise normal. Anterior wedging of multiple midthoracic vertebral bodies is stable. IMPRESSION: No active cardiopulmonary disease. Electronically Signed   By: Dorise Bullion III M.D   On: 07/30/2017 20:42   Dg Abdomen 1 View  Result Date: 07/31/2017 CLINICAL DATA:  Constipation EXAM: ABDOMEN - 1 VIEW COMPARISON:  CT 07/25/2017 FINDINGS: Visible lung bases are clear. Surgical clips in the right upper quadrant. Nonobstructed gas pattern with large amount of stool in the colon. Radiopaque material over the sacrum. No abnormal calcification IMPRESSION: Nonobstructed gas pattern with large amount of stool in the colon. Electronically Signed   By: Donavan Foil M.D.   On: 07/31/2017 02:37   Ct Head Wo Contrast  Result Date: 07/31/2017 CLINICAL DATA:  Status post fall onto right side, with right-sided head and neck pain. EXAM: CT HEAD WITHOUT CONTRAST CT CERVICAL SPINE  WITHOUT CONTRAST TECHNIQUE: Multidetector CT imaging of the head and cervical spine was performed following the standard protocol without intravenous contrast. Multiplanar CT image reconstructions of the cervical spine were also generated. COMPARISON:  None. FINDINGS: CT HEAD FINDINGS Brain: No evidence of acute infarction, hemorrhage, hydrocephalus, extra-axial collection or mass lesion/mass effect. The posterior fossa, including the cerebellum, brainstem and fourth ventricle, is within normal limits. The third and lateral ventricles, and basal ganglia are unremarkable in appearance. The cerebral hemispheres are symmetric in appearance, with normal gray-white differentiation. No mass effect or midline shift is seen. Vascular: No hyperdense vessel or unexpected calcification. Skull: There is no evidence of fracture; visualized osseous structures are unremarkable in appearance. Sinuses/Orbits: The orbits are within normal limits. There is complete opacification of the mastoid air cells bilaterally, with trace fluid tracking about the middle ears bilaterally. The paranasal sinuses are well-aerated. Other: No significant soft tissue abnormalities are seen. CT CERVICAL SPINE FINDINGS Alignment: Normal. Skull base and vertebrae: No acute fracture. No primary bone lesion or focal pathologic process. Soft tissues and spinal canal: No prevertebral fluid or swelling. No visible canal hematoma. Disc levels: Intervertebral disc spaces are preserved. The bony foramina are grossly unremarkable in appearance. Upper chest: The visualized lung apices are clear. The visualized portions of the thyroid gland are grossly unremarkable. Scattered calcification is noted at the carotid bifurcations bilaterally. Other: No additional soft tissue abnormalities are seen. IMPRESSION: 1. No evidence of traumatic intracranial injury or fracture. 2. No evidence of fracture or subluxation along the cervical spine. 3. Complete opacification of the  mastoid air cells bilaterally, with trace fluid tracking about the middle ears bilaterally. Would correlate clinically for any evidence of ear infection. 4. Scattered calcification at the carotid  bifurcations bilaterally. Carotid ultrasound would be helpful for further evaluation, when and as deemed clinically appropriate. Electronically Signed   By: Garald Balding M.D.   On: 07/31/2017 02:54   Ct Cervical Spine Wo Contrast  Result Date: 07/31/2017 CLINICAL DATA:  Status post fall onto right side, with right-sided head and neck pain. EXAM: CT HEAD WITHOUT CONTRAST CT CERVICAL SPINE WITHOUT CONTRAST TECHNIQUE: Multidetector CT imaging of the head and cervical spine was performed following the standard protocol without intravenous contrast. Multiplanar CT image reconstructions of the cervical spine were also generated. COMPARISON:  None. FINDINGS: CT HEAD FINDINGS Brain: No evidence of acute infarction, hemorrhage, hydrocephalus, extra-axial collection or mass lesion/mass effect. The posterior fossa, including the cerebellum, brainstem and fourth ventricle, is within normal limits. The third and lateral ventricles, and basal ganglia are unremarkable in appearance. The cerebral hemispheres are symmetric in appearance, with normal gray-white differentiation. No mass effect or midline shift is seen. Vascular: No hyperdense vessel or unexpected calcification. Skull: There is no evidence of fracture; visualized osseous structures are unremarkable in appearance. Sinuses/Orbits: The orbits are within normal limits. There is complete opacification of the mastoid air cells bilaterally, with trace fluid tracking about the middle ears bilaterally. The paranasal sinuses are well-aerated. Other: No significant soft tissue abnormalities are seen. CT CERVICAL SPINE FINDINGS Alignment: Normal. Skull base and vertebrae: No acute fracture. No primary bone lesion or focal pathologic process. Soft tissues and spinal canal: No  prevertebral fluid or swelling. No visible canal hematoma. Disc levels: Intervertebral disc spaces are preserved. The bony foramina are grossly unremarkable in appearance. Upper chest: The visualized lung apices are clear. The visualized portions of the thyroid gland are grossly unremarkable. Scattered calcification is noted at the carotid bifurcations bilaterally. Other: No additional soft tissue abnormalities are seen. IMPRESSION: 1. No evidence of traumatic intracranial injury or fracture. 2. No evidence of fracture or subluxation along the cervical spine. 3. Complete opacification of the mastoid air cells bilaterally, with trace fluid tracking about the middle ears bilaterally. Would correlate clinically for any evidence of ear infection. 4. Scattered calcification at the carotid bifurcations bilaterally. Carotid ultrasound would be helpful for further evaluation, when and as deemed clinically appropriate. Electronically Signed   By: Garald Balding M.D.   On: 07/31/2017 02:54   Dg Hip Unilat With Pelvis 2-3 Views Right  Result Date: 07/31/2017 CLINICAL DATA:  Fall with right-sided pain EXAM: DG HIP (WITH OR WITHOUT PELVIS) 2-3V RIGHT COMPARISON:  CT 07/25/2017 FINDINGS: Radiopaque material at the sacrum. Pubic symphysis and rami appear intact. There is no acute displaced fracture or malalignment. IMPRESSION: No definite acute osseous abnormality. Electronically Signed   By: Donavan Foil M.D.   On: 07/31/2017 02:37     Assessment/Plan Active Problems:   Hyponatremia   Hyponatremia -Na is 114, baseline is ~130. Unknown exact etiology. Denies any alcohol use.  -Urine electrolytes, Osm and Serum Osm ordered to guide the therapy. Also check BNP -provide supportive care, will choose IVF fluid accordingly  -No neurologic symptoms at this time  -Check TSH  Acute moderate exacerbation of mild persistent COPD - Start patient on Solu-Medrol 40 mg IV every 6-8 hours, scheduled and as needed  nebulizers -Supplemental oxygen as needed  Right-sided hip pain secondary to mechanical fall - No fracture noted on the x-ray (KUB) she likely may have some bone bruising or contusion.  If the pain does not improve consider getting CT to further evaluate - Pain control, PT/OT -No obvious head trauma  noted but due to ER protocol CT of the head and spine ordered in the ER- pending results   Constipation - KUB suggest large stool burden - Enema ordered, also ordered bowel regimen  Hypokalemia - Repletion for potassium ordered, magnesium level ordered with the morning labs  Essential hypertension -Continue home medications.  Lasix is on hold  History of depression and anxiety -Continue home meds  History of GERD -Continue PPI  History of hyperlipidemia -Continue pravastatin     DVT prophylaxis: Hep SQ Code Status: Full  Family Communication: None at bedside Disposition Plan:  TBD Consults called: None Admission status: Inpatient    Ankit Arsenio Loader MD [facility] Pager [phone]  If 7PM-7AM, please contact night-coverage [email or web address] Password [email or web address]  07/31/2017, 3:11 AM

## 2017-07-31 NOTE — ED Notes (Signed)
Admitting paged to RN per her request 

## 2017-07-31 NOTE — ED Notes (Signed)
Pt CBG was 135, notified Audrey(RN)

## 2017-07-31 NOTE — Progress Notes (Signed)
PROGRESS NOTE    Natalie Thomas  HYW:[id number] DOB: 11-30-1960 DOA: 07/30/2017 PCP: Renato Shin, MD   Chief Complaint  Patient presents with  . Shortness of Breath  . Fall  . Constipation    X 1 WEEK    Brief Narrative:  HPI on 07/31/2017 by Dr. Gerlean Ren LY Thomas is a 57 y.o. female with medical history significant of COPD, GERD, depression, hypertension came to the ER with complaints of right hip pain after fall.  Patient states that yesterday afternoon while transferring herself from the wheelchair she ended up sustaining a fall on the right side hurting her right hip.  She denies any loss of consciousness or any other warning signs prior to the fall.  She states it was purely mechanical fall as she lost her balance.  Since then she has had right-sided hip pain.  During this time she also states she has noticed her shortness of breath has worsened and has some coughing but unable to bring up any sputum.  Denies any fevers, chills and other complaints.  She has not had a bowel movement in over a week now but thinks this may be due to poor p.o. appetite.  Interim history Admitted for hyponatremia, COPD exacerbation and right hip pain. Assessment & Plan   Acute COPD exacerbation -Continue Solu-Medrol, supplemental oxygen as needed, nebulizer treatments -Chest x-ray unremarkable for infection -Patient currently afebrile, no leukocytosis -Very faint wheezing on examination  Hyponatremia -Baseline sodium approximately 130 -Unknown etiology -Denies alcohol use -Sodium on admission 114, currently up to 117 -Urine electrolytes: Urine sodium 65, urine osmolality 275  Right-sided hip pain secondary to mechanical fall -CT head/cervical spine unremarkable -Right hip x-ray showed no definite acute abnormality -Continue pain control -If patient continues to have pain, will obtain CT of the right hip -PT/OT consulted  Constipation -KUB suggest large stool burden -Continue  bowel regimen  Hypokalemia/hypomagnesemia -Potassium 3.7, magnesium 1.4, will replace -Continue to monitor  Essential hypertension -Lasix held -Continue clonidine, nifedipine, Coreg, hydralazine  Depression/anxiety -Continue Xanax, Wellbutrin, Latuda, depakote, Paxil  Hyperlipidemia -Continue statin  GERD -Continue PPI  Diabetes mellitus, type II -Currently on no home medications, continue insulin sliding scale and CBG monitoring  DVT Prophylaxis  heparin  Code Status: Full  Family Communication: None at bedside  Disposition Plan: Admitted  Consultants none  Procedures  none  Antibiotics   Anti-infectives (From admission, onward)   None      Subjective:   Natalie Thomas seen and examined today.  Continues to complain of constipation and poor appetite.  Continues to have shortness of breath.  Continues to complain of hip pain and inability to move.  Denies chest pain, nausea or vomiting dizziness or headache.  Objective:   Vitals:   07/31/17 0630 07/31/17 0700 07/31/17 0730 07/31/17 0745  BP: (!) 151/73 (!) 174/81 (!) 178/70 (!) 165/76  Pulse: 80 80 81 79  Resp: 18 17 15 19   Temp:      SpO2: 96% 96% 97% 96%    Intake/Output Summary (Last 24 hours) at 07/31/2017 1144 Last data filed at 07/31/2017 1000 Gross per 24 hour  Intake 250 ml  Output 1000 ml  Net -750 ml   There were no vitals filed for this visit.  Exam  General: Well developed, well nourished, NAD, appears stated age  HEENT: NCAT, mucous membranes moist.   Cardiovascular: S1 S2 auscultated, no rubs, murmurs or gallops. Regular rate and rhythm.  Respiratory: Diminished breath sounds, faint expiratory  wheezing  Abdomen: Soft, nontender, nondistended, + bowel sounds  Extremities: warm dry without cyanosis clubbing or edema  Neuro: AAOx3, nonfocal  Psych: Anxious   Data Reviewed: I have personally reviewed following labs and imaging studies  CBC: Recent Labs  Lab  07/25/17 0149 07/30/17 2105 07/31/17 0205 07/31/17 0345  WBC 8.0 12.9*  --  10.0  HGB 11.9* 11.4* 12.2 11.9*  HCT 35.1* 32.6* 36.0 33.4*  MCV 88.0 83.8  --  83.7  PLT 199 185  --  379   Basic Metabolic Panel: Recent Labs  Lab 07/25/17 0149 07/30/17 2105 07/31/17 0205 07/31/17 0345 07/31/17 0542  NA 127* 114* 115*  --  117*  K 4.1 3.3* 3.3*  --  3.7  CL 92* 79* 77*  --  83*  CO2 24 22  --   --  22  GLUCOSE 104* 141* 123*  --  104*  BUN <5* <5* <3*  --  <5*  CREATININE 0.48 0.38* 0.30* 0.37* 0.40*  CALCIUM 8.8* 8.3*  --   --  8.6*  MG  --   --   --  1.4*  --    GFR: Estimated Creatinine Clearance: 83.8 mL/min (A) (by C-G formula based on SCr of 0.4 mg/dL (L)). Liver Function Tests: Recent Labs  Lab 07/25/17 0149 07/30/17 2105  AST 16 19  ALT 15 19  ALKPHOS 67 73  BILITOT 0.3 0.6  PROT 6.5 6.5  ALBUMIN 3.2* 3.1*   Recent Labs  Lab 07/25/17 0149  LIPASE 24   No results for input(s): AMMONIA in the last 168 hours. Coagulation Profile: No results for input(s): INR, PROTIME in the last 168 hours. Cardiac Enzymes: No results for input(s): CKTOTAL, CKMB, CKMBINDEX, TROPONINI in the last 168 hours. BNP (last 3 results) No results for input(s): PROBNP in the last 8760 hours. HbA1C: No results for input(s): HGBA1C in the last 72 hours. CBG: Recent Labs  Lab 07/25/17 0155 07/31/17 0847  GLUCAP 107* 135*   Lipid Profile: No results for input(s): CHOL, HDL, LDLCALC, TRIG, CHOLHDL, LDLDIRECT in the last 72 hours. Thyroid Function Tests: Recent Labs    07/31/17 0346  TSH 0.808   Anemia Panel: No results for input(s): VITAMINB12, FOLATE, FERRITIN, TIBC, IRON, RETICCTPCT in the last 72 hours. Urine analysis:    Component Value Date/Time   COLORURINE YELLOW 07/31/2017 0114   APPEARANCEUR CLEAR 07/31/2017 0114   APPEARANCEUR Clear 06/22/2012 0618   LABSPEC 1.008 07/31/2017 0114   LABSPEC 1.003 06/22/2012 0618   PHURINE 7.0 07/31/2017 0114   GLUCOSEU 50  (A) 07/31/2017 0114   GLUCOSEU Negative 06/22/2012 0618   GLUCOSEU NEGATIVE 04/09/2011 1058   HGBUR NEGATIVE 07/31/2017 0114   BILIRUBINUR NEGATIVE 07/31/2017 0114   BILIRUBINUR Negative 06/22/2012 0618   KETONESUR 5 (A) 07/31/2017 0114   PROTEINUR NEGATIVE 07/31/2017 0114   UROBILINOGEN 1.0 08/18/2011 1805   NITRITE NEGATIVE 07/31/2017 0114   LEUKOCYTESUR NEGATIVE 07/31/2017 0114   LEUKOCYTESUR Trace 06/22/2012 0618   Sepsis Labs: @LABRCNTIP (procalcitonin:4,lacticidven:4)  )No results found for this or any previous visit (from the past 240 hour(s)).    Radiology Studies: Dg Chest 2 View  Result Date: 07/30/2017 CLINICAL DATA:  Shortness of breath and chest tightness. EXAM: CHEST  2 VIEW COMPARISON:  July 25, 2017 FINDINGS: Stable cardiomegaly. The hila, mediastinum, lungs, and pleura are otherwise normal. Anterior wedging of multiple midthoracic vertebral bodies is stable. IMPRESSION: No active cardiopulmonary disease. Electronically Signed   By: Dorise Bullion III M.D  On: 07/30/2017 20:42   Dg Abdomen 1 View  Result Date: 07/31/2017 CLINICAL DATA:  Constipation EXAM: ABDOMEN - 1 VIEW COMPARISON:  CT 07/25/2017 FINDINGS: Visible lung bases are clear. Surgical clips in the right upper quadrant. Nonobstructed gas pattern with large amount of stool in the colon. Radiopaque material over the sacrum. No abnormal calcification IMPRESSION: Nonobstructed gas pattern with large amount of stool in the colon. Electronically Signed   By: Donavan Foil M.D.   On: 07/31/2017 02:37   Ct Head Wo Contrast  Result Date: 07/31/2017 CLINICAL DATA:  Status post fall onto right side, with right-sided head and neck pain. EXAM: CT HEAD WITHOUT CONTRAST CT CERVICAL SPINE WITHOUT CONTRAST TECHNIQUE: Multidetector CT imaging of the head and cervical spine was performed following the standard protocol without intravenous contrast. Multiplanar CT image reconstructions of the cervical spine were also generated.  COMPARISON:  None. FINDINGS: CT HEAD FINDINGS Brain: No evidence of acute infarction, hemorrhage, hydrocephalus, extra-axial collection or mass lesion/mass effect. The posterior fossa, including the cerebellum, brainstem and fourth ventricle, is within normal limits. The third and lateral ventricles, and basal ganglia are unremarkable in appearance. The cerebral hemispheres are symmetric in appearance, with normal gray-white differentiation. No mass effect or midline shift is seen. Vascular: No hyperdense vessel or unexpected calcification. Skull: There is no evidence of fracture; visualized osseous structures are unremarkable in appearance. Sinuses/Orbits: The orbits are within normal limits. There is complete opacification of the mastoid air cells bilaterally, with trace fluid tracking about the middle ears bilaterally. The paranasal sinuses are well-aerated. Other: No significant soft tissue abnormalities are seen. CT CERVICAL SPINE FINDINGS Alignment: Normal. Skull base and vertebrae: No acute fracture. No primary bone lesion or focal pathologic process. Soft tissues and spinal canal: No prevertebral fluid or swelling. No visible canal hematoma. Disc levels: Intervertebral disc spaces are preserved. The bony foramina are grossly unremarkable in appearance. Upper chest: The visualized lung apices are clear. The visualized portions of the thyroid gland are grossly unremarkable. Scattered calcification is noted at the carotid bifurcations bilaterally. Other: No additional soft tissue abnormalities are seen. IMPRESSION: 1. No evidence of traumatic intracranial injury or fracture. 2. No evidence of fracture or subluxation along the cervical spine. 3. Complete opacification of the mastoid air cells bilaterally, with trace fluid tracking about the middle ears bilaterally. Would correlate clinically for any evidence of ear infection. 4. Scattered calcification at the carotid bifurcations bilaterally. Carotid ultrasound  would be helpful for further evaluation, when and as deemed clinically appropriate. Electronically Signed   By: Garald Balding M.D.   On: 07/31/2017 02:54   Ct Cervical Spine Wo Contrast  Result Date: 07/31/2017 CLINICAL DATA:  Status post fall onto right side, with right-sided head and neck pain. EXAM: CT HEAD WITHOUT CONTRAST CT CERVICAL SPINE WITHOUT CONTRAST TECHNIQUE: Multidetector CT imaging of the head and cervical spine was performed following the standard protocol without intravenous contrast. Multiplanar CT image reconstructions of the cervical spine were also generated. COMPARISON:  None. FINDINGS: CT HEAD FINDINGS Brain: No evidence of acute infarction, hemorrhage, hydrocephalus, extra-axial collection or mass lesion/mass effect. The posterior fossa, including the cerebellum, brainstem and fourth ventricle, is within normal limits. The third and lateral ventricles, and basal ganglia are unremarkable in appearance. The cerebral hemispheres are symmetric in appearance, with normal gray-white differentiation. No mass effect or midline shift is seen. Vascular: No hyperdense vessel or unexpected calcification. Skull: There is no evidence of fracture; visualized osseous structures are unremarkable in appearance. Sinuses/Orbits:  The orbits are within normal limits. There is complete opacification of the mastoid air cells bilaterally, with trace fluid tracking about the middle ears bilaterally. The paranasal sinuses are well-aerated. Other: No significant soft tissue abnormalities are seen. CT CERVICAL SPINE FINDINGS Alignment: Normal. Skull base and vertebrae: No acute fracture. No primary bone lesion or focal pathologic process. Soft tissues and spinal canal: No prevertebral fluid or swelling. No visible canal hematoma. Disc levels: Intervertebral disc spaces are preserved. The bony foramina are grossly unremarkable in appearance. Upper chest: The visualized lung apices are clear. The visualized portions of  the thyroid gland are grossly unremarkable. Scattered calcification is noted at the carotid bifurcations bilaterally. Other: No additional soft tissue abnormalities are seen. IMPRESSION: 1. No evidence of traumatic intracranial injury or fracture. 2. No evidence of fracture or subluxation along the cervical spine. 3. Complete opacification of the mastoid air cells bilaterally, with trace fluid tracking about the middle ears bilaterally. Would correlate clinically for any evidence of ear infection. 4. Scattered calcification at the carotid bifurcations bilaterally. Carotid ultrasound would be helpful for further evaluation, when and as deemed clinically appropriate. Electronically Signed   By: Garald Balding M.D.   On: 07/31/2017 02:54   Dg Hip Unilat With Pelvis 2-3 Views Right  Result Date: 07/31/2017 CLINICAL DATA:  Fall with right-sided pain EXAM: DG HIP (WITH OR WITHOUT PELVIS) 2-3V RIGHT COMPARISON:  CT 07/25/2017 FINDINGS: Radiopaque material at the sacrum. Pubic symphysis and rami appear intact. There is no acute displaced fracture or malalignment. IMPRESSION: No definite acute osseous abnormality. Electronically Signed   By: Donavan Foil M.D.   On: 07/31/2017 02:37     Scheduled Meds: . ALPRAZolam  0.5 mg Oral TID  . aspirin EC  81 mg Oral Daily  . buPROPion  150 mg Oral Daily  . carvedilol  25 mg Oral BID WC  . [START ON 08/02/2017] cloNIDine  0.3 mg Transdermal Weekly  . divalproex  500 mg Oral BID  . docusate sodium  100 mg Oral BID  . ferrous sulfate  325 mg Oral Q breakfast  . fluticasone  2 spray Each Nare BID  . fluticasone furoate-vilanterol  1 puff Inhalation Daily  . heparin  5,000 Units Subcutaneous Q8H  . hydrALAZINE  100 mg Oral Q8H  . insulin aspart  0-5 Units Subcutaneous QHS  . insulin aspart  0-9 Units Subcutaneous TID WC  . ipratropium-albuterol  3 mL Nebulization Q6H  . lurasidone  80 mg Oral Q breakfast  . Melatonin  9 mg Oral QHS  . methylPREDNISolone  (SOLU-MEDROL) injection  40 mg Intravenous Q6H  . multivitamin with minerals   Oral Daily  . NIFEdipine  90 mg Oral Daily  . pantoprazole  40 mg Oral Daily  . PARoxetine  40 mg Oral Daily  . polyethylene glycol  17 g Oral Daily  . pravastatin  20 mg Oral Daily  . sodium chloride  1 g Oral TID WC  . sucralfate  1 g Oral QID   Continuous Infusions: . sodium chloride 100 mL/hr at 07/31/17 0545     LOS: 0 days   Time Spent in minutes   30 minutes  Taylour Lietzke D.O. on 07/31/2017 at 11:44 AM  Between 7am to 7pm - Pager - [phone]  After 7pm go to [email or web address] - password [email or web address]  And look for the night coverage person covering for me after hours  [facility] Office  [phone]

## 2017-07-31 NOTE — ED Provider Notes (Signed)
[facility] EMERGENCY DEPARTMENT Provider Note   CSN: [id number] Arrival date & time: 07/30/17  1818     History   Chief Complaint Chief Complaint  Patient presents with  . Shortness of Breath  . Fall  . Constipation    X 1 WEEK    HPI Natalie Thomas is a 57 y.o. female.  The history is provided by the patient.  Fall  This is a new problem. The current episode started 6 to 12 hours ago. The problem occurs constantly. The problem has not changed since onset.Pertinent negatives include no chest pain, no abdominal pain, no headaches and no shortness of breath. Associated symptoms comments: Constipation and right hip pain . Nothing aggravates the symptoms. Nothing relieves the symptoms. She has tried nothing for the symptoms. The treatment provided no relief.    Past Medical History:  Diagnosis Date  . CHF (congestive heart failure) ([place])   . COPD (chronic obstructive pulmonary disease) ([place])   . Depression   . Diabetes mellitus, type 2 ([place])    pt denies but states that she has been treated for DM  . GERD (gastroesophageal reflux disease)   . Hypertension   . Osteoporosis     Patient Active Problem List   Diagnosis Date Noted  . Incontinence of urine 08/14/2011  . Hyponatremia 07/27/2011  . Schizoaffective disorder, bipolar type ([place]) 07/05/2011  . Cough 05/08/2011  . Encounter for long-term (current) use of other medications 04/09/2011  . Lumbar disc disease 04/09/2011  . Polycythemia secondary to smoking 04/09/2011  . HIP PAIN, LEFT 12/12/2007  . 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 DIABETES MELLITUS, TYPE II 04/08/2007  . MENOPAUSE, PREMATURE 04/08/2007  . HYPONATREMIA 04/08/2007  . SCHIZOAFFECTIVE DISORDER 04/08/2007  . DEPRESSION 04/08/2007  . HYPERTENSION 04/08/2007  . COPD 04/08/2007  . GERD 04/08/2007  . OSTEOPOROSIS 04/08/2007  . DYSURIA 04/08/2007    Past Surgical History:  Procedure Laterality Date  . EYE SURGERY     on  left  eye, pt states that she sees bad out of the right eye and needs surgery there  . PELVIC FRACTURE SURGERY      OB History    No data available       Home Medications    Prior to Admission medications   Medication Sig Start Date End Date Taking? Authorizing Provider  acetaminophen (TYLENOL) 325 MG tablet Take 650 mg by mouth every 6 (six) hours as needed for mild pain.   Yes [provider]  albuterol (PROVENTIL HFA;VENTOLIN HFA) 108 (90 Base) MCG/ACT inhaler Inhale 2 puffs into the lungs every 4 (four) hours as needed for wheezing or shortness of breath. 07/05/17  Yes Ward, Delice Bison, DO  ALPRAZolam (XANAX) 0.5 MG tablet Take 0.5 mg by mouth 3 (three) times daily.   Yes [provider]  alum & mag hydroxide-simeth ([place]) 200-200-20 MG/5ML suspension Take 30 mLs by mouth every 6 (six) hours as needed for indigestion or heartburn.   Yes [provider]  antiseptic oral rinse (BIOTENE) LIQD 2 mLs by Mouth Rinse route as needed for dry mouth.   Yes [provider]  aspirin 81 MG tablet Take 1 tablet (81 mg total) by mouth daily. For heart health 08/06/11  Yes Greig Castilla, FNP  buPROPion (WELLBUTRIN XL) 150 MG 24 hr tablet Take 150 mg by mouth daily.   Yes [provider]  carvedilol (COREG) 25 MG tablet Take 25 mg by mouth 2 (two) times  daily with a meal.   Yes [provider]  cloNIDine (CATAPRES - DOSED IN MG/24 HR) 0.3 mg/24hr patch Place 0.3 mg onto the skin once a week.   Yes [provider]  divalproex (DEPAKOTE ER) 500 MG 24 hr tablet Take 500 mg by mouth 2 (two) times daily.  08/06/11  Yes Greig Castilla, FNP  ferrous sulfate 325 (65 FE) MG tablet Take 325 mg by mouth daily with breakfast.   Yes [provider]  fluticasone (FLONASE) 50 MCG/ACT nasal spray Place 2 sprays into both nostrils 2 (two) times daily.   Yes [provider]  fluticasone furoate-vilanterol (BREO ELLIPTA) 100-25 MCG/INH  AEPB Inhale 1 puff into the lungs daily.   Yes [provider]  furosemide (LASIX) 20 MG tablet Take 1 tablet (20 mg total) by mouth daily. For fluid. 08/06/11 07/31/17 Yes Greig Castilla, FNP  guaifenesin (ROBITUSSIN) 100 MG/5ML syrup Take 200 mg by mouth 3 (three) times daily as needed for cough.   Yes [provider]  hydrALAZINE (APRESOLINE) 100 MG tablet Take 100 mg by mouth every 8 (eight) hours.   Yes [provider]  HYDROcodone-acetaminophen (NORCO/VICODIN) 5-325 MG tablet Take 1 tablet by mouth every 8 (eight) hours as needed for moderate pain.   Yes [provider]  ipratropium-albuterol (DUONEB) 0.5-2.5 (3) MG/3ML SOLN Take 3 mLs by nebulization 3 (three) times daily as needed (SOb).   Yes [provider]  loperamide (IMODIUM) 2 MG capsule Take 2 mg by mouth daily as needed for diarrhea or loose stools.   Yes [provider]  lurasidone (LATUDA) 80 MG TABS tablet Take 80 mg by mouth daily with breakfast.   Yes [provider]  magnesium hydroxide (MILK OF MAGNESIA) 400 MG/5ML suspension Take 30 mLs by mouth at bedtime as needed for mild constipation.   Yes [provider]  Melatonin 10 MG TABS Take 10 mg by mouth at bedtime.   Yes [provider]  Multiple Vitamin (DAILY-VITE PO) Take 1 tablet by mouth daily.   Yes [provider]  neomycin-bacitracin-polymyxin (NEOSPORIN) OINT Apply 1 application topically daily as needed for wound care.   Yes [provider]  NIFEdipine (PROCARDIA XL/ADALAT-CC) 90 MG 24 hr tablet Take 90 mg by mouth daily.   Yes [provider]  omeprazole (PRILOSEC) 20 MG capsule Take 1 capsule (20 mg total) by mouth daily. For acid reflux. Patient taking differently: Take 20 mg by mouth 2 (two) times daily before a meal. For acid reflux. 08/06/11  Yes Greig Castilla, FNP  PARoxetine (PAXIL) 20 MG tablet Take 40 mg by mouth daily.   Yes [provider]    polyethylene glycol (MIRALAX) packet Take 17 g by mouth daily. 07/17/17  Yes Horton, Barbette Hair, MD  pravastatin (PRAVACHOL) 20 MG tablet Take 20 mg by mouth daily.   Yes [provider]  promethazine (PHENERGAN) 25 MG tablet Take 25 mg by mouth every 6 (six) hours as needed for nausea or vomiting.   Yes [provider]  sodium chloride 1 g tablet Take 1 g by mouth 3 (three) times daily with meals.   Yes [provider]  sucralfate (CARAFATE) 1 g tablet Take 1 g by mouth 4 (four) times daily.   Yes [provider]  tiotropium (SPIRIVA) 18 MCG inhalation capsule Place 18 mcg into inhaler and inhale daily.   Yes [provider]    Family History No family history on file.  Social History Social History   Tobacco Use  . Smoking status: Current Every Day Smoker    Packs/day: 0.25    Types: Cigarettes  . Smokeless tobacco: Never Used  Substance Use Topics  . Alcohol use: No  . Drug use: No     Allergies   Clarithromycin and Penicillins   Review of Systems Review of Systems  Constitutional: Negative for fever.  Respiratory: Positive for cough and wheezing. Negative for shortness of breath.   Cardiovascular: Negative for chest pain, palpitations and leg swelling.  Gastrointestinal: Positive for constipation. Negative for abdominal pain.  Musculoskeletal: Positive for arthralgias.  Neurological: Negative for headaches.  All other systems reviewed and are negative.    Physical Exam Updated Vital Signs BP (!) 180/70   Pulse 86   Temp 97.7 F (36.5 C)   Resp 18   SpO2 99%   Physical Exam  Constitutional: She appears well-developed and well-nourished. No distress.  HENT:  Head: Normocephalic and atraumatic.  Mouth/Throat: No oropharyngeal exudate.  Eyes: Conjunctivae are normal. Pupils are equal, round, and reactive to light.  Neck: Normal range of motion. Neck supple. No JVD present. No tracheal deviation present.   Cardiovascular: Normal rate, regular rhythm, normal heart sounds and intact distal pulses.  Pulmonary/Chest: No stridor. No respiratory distress. She has wheezes. She has no rales. She exhibits no tenderness.  Abdominal: Soft. She exhibits no mass. There is no tenderness. There is no rebound and no guarding.  Palpable stool   Musculoskeletal: Normal range of motion. She exhibits no edema or deformity.  Neurological: She is alert.  Skin: Skin is warm and dry. Capillary refill takes less than 2 seconds.  Psychiatric: She has a normal mood and affect.     ED Treatments / Results  Labs (all labs ordered are listed, but only abnormal results are displayed)  Results for orders placed or performed during the hospital encounter of 07/30/17  CBC  Result Value Ref Range   WBC 12.9 (H) 4.0 - 10.5 K/uL   RBC 3.89 3.87 - 5.11 MIL/uL   Hemoglobin 11.4 (L) 12.0 - 15.0 g/dL   HCT 32.6 (L) 36.0 - 46.0 %   MCV 83.8 78.0 - 100.0 fL   MCH 29.3 26.0 - 34.0 pg   MCHC 35.0 30.0 - 36.0 g/dL   RDW 13.6 11.5 - 15.5 %   Platelets 185 150 - 400 K/uL  Comprehensive metabolic panel  Result Value Ref Range   Sodium 114 (LL) 135 - 145 mmol/L   Potassium 3.3 (L) 3.5 - 5.1 mmol/L   Chloride 79 (L) 101 - 111 mmol/L   CO2 22 22 - 32 mmol/L   Glucose, Bld 141 (H) 65 - 99 mg/dL   BUN <5 (L) 6 - 20 mg/dL   Creatinine, Ser 0.38 (L) 0.44 - 1.00 mg/dL   Calcium 8.3 (L) 8.9 - 10.3 mg/dL   Total Protein 6.5 6.5 - 8.1 g/dL   Albumin 3.1 (L) 3.5 - 5.0 g/dL   AST 19 15 - 41 U/L   ALT 19 14 - 54 U/L   Alkaline Phosphatase 73 38 - 126 U/L   Total Bilirubin 0.6 0.3 - 1.2 mg/dL   GFR calc non Af Amer >60 >60 mL/min   GFR calc Af Amer >60 >60 mL/min   Anion gap 13 5 - 15  Urinalysis, Routine w reflex microscopic  Result Value Ref Range   Color, Urine YELLOW YELLOW   APPearance CLEAR CLEAR   Specific Gravity, Urine  1.008 1.005 - 1.030   pH 7.0 5.0 - 8.0   Glucose, UA 50 (A) NEGATIVE mg/dL   Hgb urine dipstick  NEGATIVE NEGATIVE   Bilirubin Urine NEGATIVE NEGATIVE   Ketones, ur 5 (A) NEGATIVE mg/dL   Protein, ur NEGATIVE NEGATIVE mg/dL   Nitrite NEGATIVE NEGATIVE   Leukocytes, UA NEGATIVE NEGATIVE  Osmolality, urine  Result Value Ref Range   Osmolality, Ur 275 (L) 300 - 900 mOsm/kg  Osmolality  Result Value Ref Range   Osmolality 239 (LL) 275 - 295 mOsm/kg  Na and K (sodium & potassium), rand urine  Result Value Ref Range   Sodium, Ur 65 mmol/L   Potassium Urine 27 mmol/L  Chloride, urine, random  Result Value Ref Range   Chloride Urine 71 mmol/L  I-Stat Chem 8, ED  Result Value Ref Range   Sodium 115 (LL) 135 - 145 mmol/L   Potassium 3.3 (L) 3.5 - 5.1 mmol/L   Chloride 77 (L) 101 - 111 mmol/L   BUN <3 (L) 6 - 20 mg/dL   Creatinine, Ser 0.30 (L) 0.44 - 1.00 mg/dL   Glucose, Bld 123 (H) 65 - 99 mg/dL   Calcium, Ion 1.06 (L) 1.15 - 1.40 mmol/L   TCO2 28 22 - 32 mmol/L   Hemoglobin 12.2 12.0 - 15.0 g/dL   HCT 36.0 36.0 - 46.0 %   Comment NOTIFIED PHYSICIAN   I-stat troponin, ED  Result Value Ref Range   Troponin i, poc 0.01 0.00 - 0.08 ng/mL   Comment 3           Dg Chest 2 View  Result Date: 07/30/2017 CLINICAL DATA:  Shortness of breath and chest tightness. EXAM: CHEST  2 VIEW COMPARISON:  July 25, 2017 FINDINGS: Stable cardiomegaly. The hila, mediastinum, lungs, and pleura are otherwise normal. Anterior wedging of multiple midthoracic vertebral bodies is stable. IMPRESSION: No active cardiopulmonary disease. Electronically Signed   By: Dorise Bullion III M.D   On: 07/30/2017 20:42   Dg Chest 2 View  Result Date: 07/25/2017 CLINICAL DATA:  Chest pain and sob today, HTN, DM, COPD, CHF, smoker. Pt on 2L O2 at home EXAM: CHEST  2 VIEW COMPARISON:  Chest x-ray dated 07/17/2017, chest x-ray dated 05/30/2012. FINDINGS: Stable mild cardiomegaly. Aortic atherosclerosis. Lungs are clear. No pleural effusion or pneumothorax seen. No acute or suspicious osseous finding. IMPRESSION: No  active cardiopulmonary disease. No evidence of pneumonia or pulmonary edema. Stable mild cardiomegaly. Electronically Signed   By: Franki Cabot M.D.   On: 07/25/2017 02:18   Dg Chest 2 View  Result Date: 07/05/2017 CLINICAL DATA:  Dyspnea for 3 weeks and productive cough.  Smoker. EXAM: CHEST  2 VIEW COMPARISON:  06/22/2012 FINDINGS: The heart size and mediastinal contours are within normal limits. Mild uncoiling of the thoracic aorta without aneurysm. Aortic atherosclerosis is noted at the arch. Mild diffuse interstitial prominence consistent with chronic bronchitic change. Atelectasis is noted at the left lung base. The visualized skeletal structures are unremarkable. IMPRESSION: Interstitial prominence of the lungs consistent with chronic bronchitic change. Aortic atherosclerosis. Electronically Signed   By: Ashley Royalty M.D.   On: 07/05/2017 02:42   Dg Abdomen 1 View  Result Date: 07/31/2017 CLINICAL DATA:  Constipation EXAM: ABDOMEN - 1 VIEW COMPARISON:  CT 07/25/2017 FINDINGS: Visible lung bases are clear. Surgical clips in the right upper quadrant. Nonobstructed gas pattern with large amount of stool in the colon. Radiopaque material over the sacrum. No abnormal calcification IMPRESSION: Nonobstructed gas  pattern with large amount of stool in the colon. Electronically Signed   By: Donavan Foil M.D.   On: 07/31/2017 02:37   Ct Head Wo Contrast  Result Date: 07/31/2017 CLINICAL DATA:  Status post fall onto right side, with right-sided head and neck pain. EXAM: CT HEAD WITHOUT CONTRAST CT CERVICAL SPINE WITHOUT CONTRAST TECHNIQUE: Multidetector CT imaging of the head and cervical spine was performed following the standard protocol without intravenous contrast. Multiplanar CT image reconstructions of the cervical spine were also generated. COMPARISON:  None. FINDINGS: CT HEAD FINDINGS Brain: No evidence of acute infarction, hemorrhage, hydrocephalus, extra-axial collection or mass lesion/mass effect.  The posterior fossa, including the cerebellum, brainstem and fourth ventricle, is within normal limits. The third and lateral ventricles, and basal ganglia are unremarkable in appearance. The cerebral hemispheres are symmetric in appearance, with normal gray-white differentiation. No mass effect or midline shift is seen. Vascular: No hyperdense vessel or unexpected calcification. Skull: There is no evidence of fracture; visualized osseous structures are unremarkable in appearance. Sinuses/Orbits: The orbits are within normal limits. There is complete opacification of the mastoid air cells bilaterally, with trace fluid tracking about the middle ears bilaterally. The paranasal sinuses are well-aerated. Other: No significant soft tissue abnormalities are seen. CT CERVICAL SPINE FINDINGS Alignment: Normal. Skull base and vertebrae: No acute fracture. No primary bone lesion or focal pathologic process. Soft tissues and spinal canal: No prevertebral fluid or swelling. No visible canal hematoma. Disc levels: Intervertebral disc spaces are preserved. The bony foramina are grossly unremarkable in appearance. Upper chest: The visualized lung apices are clear. The visualized portions of the thyroid gland are grossly unremarkable. Scattered calcification is noted at the carotid bifurcations bilaterally. Other: No additional soft tissue abnormalities are seen. IMPRESSION: 1. No evidence of traumatic intracranial injury or fracture. 2. No evidence of fracture or subluxation along the cervical spine. 3. Complete opacification of the mastoid air cells bilaterally, with trace fluid tracking about the middle ears bilaterally. Would correlate clinically for any evidence of ear infection. 4. Scattered calcification at the carotid bifurcations bilaterally. Carotid ultrasound would be helpful for further evaluation, when and as deemed clinically appropriate. Electronically Signed   By: Garald Balding M.D.   On: 07/31/2017 02:54   Ct  Cervical Spine Wo Contrast  Result Date: 07/31/2017 CLINICAL DATA:  Status post fall onto right side, with right-sided head and neck pain. EXAM: CT HEAD WITHOUT CONTRAST CT CERVICAL SPINE WITHOUT CONTRAST TECHNIQUE: Multidetector CT imaging of the head and cervical spine was performed following the standard protocol without intravenous contrast. Multiplanar CT image reconstructions of the cervical spine were also generated. COMPARISON:  None. FINDINGS: CT HEAD FINDINGS Brain: No evidence of acute infarction, hemorrhage, hydrocephalus, extra-axial collection or mass lesion/mass effect. The posterior fossa, including the cerebellum, brainstem and fourth ventricle, is within normal limits. The third and lateral ventricles, and basal ganglia are unremarkable in appearance. The cerebral hemispheres are symmetric in appearance, with normal gray-white differentiation. No mass effect or midline shift is seen. Vascular: No hyperdense vessel or unexpected calcification. Skull: There is no evidence of fracture; visualized osseous structures are unremarkable in appearance. Sinuses/Orbits: The orbits are within normal limits. There is complete opacification of the mastoid air cells bilaterally, with trace fluid tracking about the middle ears bilaterally. The paranasal sinuses are well-aerated. Other: No significant soft tissue abnormalities are seen. CT CERVICAL SPINE FINDINGS Alignment: Normal. Skull base and vertebrae: No acute fracture. No primary bone lesion or focal pathologic process. Soft tissues  and spinal canal: No prevertebral fluid or swelling. No visible canal hematoma. Disc levels: Intervertebral disc spaces are preserved. The bony foramina are grossly unremarkable in appearance. Upper chest: The visualized lung apices are clear. The visualized portions of the thyroid gland are grossly unremarkable. Scattered calcification is noted at the carotid bifurcations bilaterally. Other: No additional soft tissue  abnormalities are seen. IMPRESSION: 1. No evidence of traumatic intracranial injury or fracture. 2. No evidence of fracture or subluxation along the cervical spine. 3. Complete opacification of the mastoid air cells bilaterally, with trace fluid tracking about the middle ears bilaterally. Would correlate clinically for any evidence of ear infection. 4. Scattered calcification at the carotid bifurcations bilaterally. Carotid ultrasound would be helpful for further evaluation, when and as deemed clinically appropriate. Electronically Signed   By: Garald Balding M.D.   On: 07/31/2017 02:54   Ct Abdomen Pelvis W Contrast  Result Date: 07/25/2017 CLINICAL DATA:  Mid central abdominal pain into the chest for months. Previous hernia repair. Irritable bowel syndrome. Abdominal distention. EXAM: CT ABDOMEN AND PELVIS WITH CONTRAST TECHNIQUE: Multidetector CT imaging of the abdomen and pelvis was performed using the standard protocol following bolus administration of intravenous contrast. CONTRAST:  11mL ISOVUE-300 IOPAMIDOL (ISOVUE-300) INJECTION 61% COMPARISON:  06/04/2012 FINDINGS: Lower chest: Atelectasis in the lung bases. Mild cardiac enlargement with small pericardial effusion. Hepatobiliary: No focal liver abnormality is seen. Status post cholecystectomy. No biliary dilatation. Pancreas: Unremarkable. No pancreatic ductal dilatation or surrounding inflammatory changes. Spleen: Normal in size without focal abnormality. Adrenals/Urinary Tract: No adrenal gland nodules. Scarring in the right kidney. Parenchymal stone in the upper pole associated with the area of scarring, unchanged since previous study. No hydronephrosis or hydroureter. No ureteral stones. Bladder is unremarkable. Stomach/Bowel: Stomach and small bowel are decompressed. Diffusely stool-filled colon without abnormal distention. No wall thickening or inflammatory changes. There is an infraumbilical midline abdominal wall hernia containing small bowel.  No proximal obstruction. The appendix is not identified. Vascular/Lymphatic: Aortic atherosclerosis. Prominent atherosclerotic changes throughout the abdominal aorta with probable areas of significant stenosis in the abdominal aorta and bifurcation. Probable occlusion of the origin of the left common iliac artery with reconstitution in the external iliac. Severe calcification at the origin of the superior mesenteric artery with flow demonstrated distally. No significant lymphadenopathy. Reproductive: Uterus and bilateral adnexa are unremarkable. Other: No free air or free fluid in the abdomen. Nodular infiltration in the left anterior abdominal wall fat likely representing injection sites. Musculoskeletal: Degenerative changes in the spine. Multiple lower thoracic and lumbar vertebral compression deformities, likely chronic. Old sacral fracture deformities. IMPRESSION: 1. Stone and scarring in the right kidney without change. No ureteral stone or obstruction. 2. Low midline abdominal wall hernia containing small bowel without proximal obstruction. 3. Prominent aortic atherosclerosis with likely areas of significant stenosis in the abdominal aorta and iliac arteries. Probable occlusion of the origin of the left common iliac artery with reconstitution in the external iliac. 4. Multiple lower thoracic and lumbar vertebral compression deformities and sacral fracture deformity, likely old. Electronically Signed   By: Lucienne Capers M.D.   On: 07/25/2017 04:26   Dg Abdomen Acute W/chest  Result Date: 07/17/2017 CLINICAL DATA:  Productive cough for 1 month. Nausea for 2 weeks. Left lower quadrant abdominal not and bruising. Decreased oxygen saturation. EXAM: DG ABDOMEN ACUTE W/ 1V CHEST COMPARISON:  Chest 07/05/2017 FINDINGS: Cardiac enlargement. No airspace disease or consolidation in the lungs. No blunting of costophrenic angles. No pneumothorax. Calcification of the aorta.  Gas and stool throughout the colon. No  small or large bowel distention. No free intra-abdominal air. No abnormal air-fluid levels. No radiopaque stones. Benign-appearing calcification in the sacral ala. Surgical clips in the right upper quadrant. Degenerative changes in the lumbar spine. Vascular calcifications. IMPRESSION: Cardiac enlargement.  No evidence of active pulmonary disease. Nonobstructive bowel gas pattern with stool-filled colon. Aortic atherosclerosis. Electronically Signed   By: Lucienne Capers M.D.   On: 07/17/2017 01:12   Dg Hip Unilat With Pelvis 2-3 Views Right  Result Date: 07/31/2017 CLINICAL DATA:  Fall with right-sided pain EXAM: DG HIP (WITH OR WITHOUT PELVIS) 2-3V RIGHT COMPARISON:  CT 07/25/2017 FINDINGS: Radiopaque material at the sacrum. Pubic symphysis and rami appear intact. There is no acute displaced fracture or malalignment. IMPRESSION: No definite acute osseous abnormality. Electronically Signed   By: Donavan Foil M.D.   On: 07/31/2017 02:37    EKG  EKG Interpretation  Date/Time:  Tuesday July 30 2017 18:20:23 EST Ventricular Rate:  75 PR Interval:  198 QRS Duration: 70 QT Interval:  392 QTC Calculation: 437 R Axis:   129 Text Interpretation:  Sinus rhythm with Premature atrial complexes Confirmed by Dory Horn) on 07/31/2017 12:59:38 AM       Radiology Dg Chest 2 View  Result Date: 07/30/2017 CLINICAL DATA:  Shortness of breath and chest tightness. EXAM: CHEST  2 VIEW COMPARISON:  July 25, 2017 FINDINGS: Stable cardiomegaly. The hila, mediastinum, lungs, and pleura are otherwise normal. Anterior wedging of multiple midthoracic vertebral bodies is stable. IMPRESSION: No active cardiopulmonary disease. Electronically Signed   By: Dorise Bullion III M.D   On: 07/30/2017 20:42   Dg Abdomen 1 View  Result Date: 07/31/2017 CLINICAL DATA:  Constipation EXAM: ABDOMEN - 1 VIEW COMPARISON:  CT 07/25/2017 FINDINGS: Visible lung bases are clear. Surgical clips in the right upper  quadrant. Nonobstructed gas pattern with large amount of stool in the colon. Radiopaque material over the sacrum. No abnormal calcification IMPRESSION: Nonobstructed gas pattern with large amount of stool in the colon. Electronically Signed   By: Donavan Foil M.D.   On: 07/31/2017 02:37   Ct Head Wo Contrast  Result Date: 07/31/2017 CLINICAL DATA:  Status post fall onto right side, with right-sided head and neck pain. EXAM: CT HEAD WITHOUT CONTRAST CT CERVICAL SPINE WITHOUT CONTRAST TECHNIQUE: Multidetector CT imaging of the head and cervical spine was performed following the standard protocol without intravenous contrast. Multiplanar CT image reconstructions of the cervical spine were also generated. COMPARISON:  None. FINDINGS: CT HEAD FINDINGS Brain: No evidence of acute infarction, hemorrhage, hydrocephalus, extra-axial collection or mass lesion/mass effect. The posterior fossa, including the cerebellum, brainstem and fourth ventricle, is within normal limits. The third and lateral ventricles, and basal ganglia are unremarkable in appearance. The cerebral hemispheres are symmetric in appearance, with normal gray-white differentiation. No mass effect or midline shift is seen. Vascular: No hyperdense vessel or unexpected calcification. Skull: There is no evidence of fracture; visualized osseous structures are unremarkable in appearance. Sinuses/Orbits: The orbits are within normal limits. There is complete opacification of the mastoid air cells bilaterally, with trace fluid tracking about the middle ears bilaterally. The paranasal sinuses are well-aerated. Other: No significant soft tissue abnormalities are seen. CT CERVICAL SPINE FINDINGS Alignment: Normal. Skull base and vertebrae: No acute fracture. No primary bone lesion or focal pathologic process. Soft tissues and spinal canal: No prevertebral fluid or swelling. No visible canal hematoma. Disc levels: Intervertebral disc spaces are preserved. The  bony  foramina are grossly unremarkable in appearance. Upper chest: The visualized lung apices are clear. The visualized portions of the thyroid gland are grossly unremarkable. Scattered calcification is noted at the carotid bifurcations bilaterally. Other: No additional soft tissue abnormalities are seen. IMPRESSION: 1. No evidence of traumatic intracranial injury or fracture. 2. No evidence of fracture or subluxation along the cervical spine. 3. Complete opacification of the mastoid air cells bilaterally, with trace fluid tracking about the middle ears bilaterally. Would correlate clinically for any evidence of ear infection. 4. Scattered calcification at the carotid bifurcations bilaterally. Carotid ultrasound would be helpful for further evaluation, when and as deemed clinically appropriate. Electronically Signed   By: Garald Balding M.D.   On: 07/31/2017 02:54   Ct Cervical Spine Wo Contrast  Result Date: 07/31/2017 CLINICAL DATA:  Status post fall onto right side, with right-sided head and neck pain. EXAM: CT HEAD WITHOUT CONTRAST CT CERVICAL SPINE WITHOUT CONTRAST TECHNIQUE: Multidetector CT imaging of the head and cervical spine was performed following the standard protocol without intravenous contrast. Multiplanar CT image reconstructions of the cervical spine were also generated. COMPARISON:  None. FINDINGS: CT HEAD FINDINGS Brain: No evidence of acute infarction, hemorrhage, hydrocephalus, extra-axial collection or mass lesion/mass effect. The posterior fossa, including the cerebellum, brainstem and fourth ventricle, is within normal limits. The third and lateral ventricles, and basal ganglia are unremarkable in appearance. The cerebral hemispheres are symmetric in appearance, with normal gray-white differentiation. No mass effect or midline shift is seen. Vascular: No hyperdense vessel or unexpected calcification. Skull: There is no evidence of fracture; visualized osseous structures are unremarkable in  appearance. Sinuses/Orbits: The orbits are within normal limits. There is complete opacification of the mastoid air cells bilaterally, with trace fluid tracking about the middle ears bilaterally. The paranasal sinuses are well-aerated. Other: No significant soft tissue abnormalities are seen. CT CERVICAL SPINE FINDINGS Alignment: Normal. Skull base and vertebrae: No acute fracture. No primary bone lesion or focal pathologic process. Soft tissues and spinal canal: No prevertebral fluid or swelling. No visible canal hematoma. Disc levels: Intervertebral disc spaces are preserved. The bony foramina are grossly unremarkable in appearance. Upper chest: The visualized lung apices are clear. The visualized portions of the thyroid gland are grossly unremarkable. Scattered calcification is noted at the carotid bifurcations bilaterally. Other: No additional soft tissue abnormalities are seen. IMPRESSION: 1. No evidence of traumatic intracranial injury or fracture. 2. No evidence of fracture or subluxation along the cervical spine. 3. Complete opacification of the mastoid air cells bilaterally, with trace fluid tracking about the middle ears bilaterally. Would correlate clinically for any evidence of ear infection. 4. Scattered calcification at the carotid bifurcations bilaterally. Carotid ultrasound would be helpful for further evaluation, when and as deemed clinically appropriate. Electronically Signed   By: Garald Balding M.D.   On: 07/31/2017 02:54   Dg Hip Unilat With Pelvis 2-3 Views Right  Result Date: 07/31/2017 CLINICAL DATA:  Fall with right-sided pain EXAM: DG HIP (WITH OR WITHOUT PELVIS) 2-3V RIGHT COMPARISON:  CT 07/25/2017 FINDINGS: Radiopaque material at the sacrum. Pubic symphysis and rami appear intact. There is no acute displaced fracture or malalignment. IMPRESSION: No definite acute osseous abnormality. Electronically Signed   By: Donavan Foil M.D.   On: 07/31/2017 02:37    Procedures Procedures  (including critical care time)  Medications Ordered in ED Medications  potassium chloride 10 mEq in 100 mL IVPB (10 mEq Intravenous New Bag/Given 07/31/17 0257)  polyethylene glycol (MIRALAX /  GLYCOLAX) packet 17 g (not administered)  aspirin EC tablet 81 mg (not administered)  pantoprazole (PROTONIX) EC tablet 40 mg (not administered)  divalproex (DEPAKOTE ER) 24 hr tablet 500 mg (not administered)  ALPRAZolam (XANAX) tablet 0.5 mg (not administered)  antiseptic oral rinse (BIOTENE) solution 2 mL (not administered)  fluticasone furoate-vilanterol (BREO ELLIPTA) 100-25 MCG/INH 1 puff (not administered)  buPROPion (WELLBUTRIN XL) 24 hr tablet 150 mg (not administered)  carvedilol (COREG) tablet 25 mg (not administered)  cloNIDine (CATAPRES - Dosed in mg/24 hr) patch 0.3 mg (not administered)  ferrous sulfate tablet 325 mg (not administered)  fluticasone (FLONASE) 50 MCG/ACT nasal spray 2 spray (not administered)  hydrALAZINE (APRESOLINE) tablet 100 mg (not administered)  HYDROcodone-acetaminophen (NORCO/VICODIN) 5-325 MG per tablet 1 tablet (not administered)  lurasidone (LATUDA) tablet 80 mg (not administered)  acetaminophen (TYLENOL) tablet 650 mg (not administered)  Melatonin TABS 9 mg (not administered)  NIFEdipine (PROCARDIA XL/ADALAT-CC) 24 hr tablet 90 mg (not administered)  pravastatin (PRAVACHOL) tablet 20 mg (not administered)  promethazine (PHENERGAN) tablet 25 mg (not administered)  sucralfate (CARAFATE) tablet 1 g (not administered)  multivitamin with minerals tablet (not administered)  loperamide (IMODIUM) capsule 2 mg (not administered)  alum & mag hydroxide-simeth (MAALOX/MYLANTA) 200-200-20 MG/5ML suspension 30 mL (not administered)  PARoxetine (PAXIL) tablet 40 mg (not administered)  guaifenesin (ROBITUSSIN) 100 MG/5ML syrup 200 mg (not administered)  sodium chloride tablet 1 g (not administered)  heparin injection 5,000 Units (not administered)  ibuprofen  (ADVIL,MOTRIN) tablet 600 mg (not administered)  ketorolac (TORADOL) 15 MG/ML injection 15 mg (not administered)  docusate sodium (COLACE) capsule 100 mg (not administered)  magnesium hydroxide (MILK OF MAGNESIA) suspension 30 mL (not administered)  bisacodyl (DULCOLAX) EC tablet 5 mg (not administered)  ondansetron (ZOFRAN) tablet 4 mg (not administered)    Or  ondansetron (ZOFRAN) injection 4 mg (not administered)  insulin aspart (novoLOG) injection 0-9 Units (not administered)  insulin aspart (novoLOG) injection 0-5 Units (not administered)  methylPREDNISolone sodium succinate (SOLU-MEDROL) 40 mg/mL injection 40 mg (not administered)  ipratropium-albuterol (DUONEB) 0.5-2.5 (3) MG/3ML nebulizer solution 3 mL (not administered)  ipratropium-albuterol (DUONEB) 0.5-2.5 (3) MG/3ML nebulizer solution 3 mL (not administered)  hydrALAZINE (APRESOLINE) injection 10 mg (not administered)  ketorolac (TORADOL) 30 MG/ML injection 30 mg (30 mg Intravenous Given 07/31/17 0248)  ipratropium-albuterol (DUONEB) 0.5-2.5 (3) MG/3ML nebulizer solution 3 mL (3 mLs Nebulization Given 07/31/17 0251)    MDM Reviewed: previous chart, nursing note and vitals Reviewed previous: labs Interpretation: labs, ECG, x-ray and CT scan (constipation by me on XR no bleed on head CAT by me hypokalemia and extreme hyponatremia by me NA correction by me) Total time providing critical care: 75-105 minutes (sodium and potassium correction ). This excludes time spent performing separately reportable procedures and services. Consults: admitting MD   CRITICAL CARE Performed by: Carlisle Beers Total critical care time: 75 minutes Critical care time was exclusive of separately billable procedures and treating other patients. Critical care was necessary to treat or prevent imminent or life-threatening deterioration. Critical care was time spent personally by me on the following activities: development of treatment plan with  patient and/or surrogate as well as nursing, discussions with consultants, evaluation of patient's response to treatment, examination of patient, obtaining history from patient or surrogate, ordering and performing treatments and interventions, ordering and review of laboratory studies, ordering and review of radiographic studies, pulse oximetry and re-evaluation of patient's condition.   Final Clinical Impressions(s) / ED Diagnoses   Final diagnoses:  Constipation, unspecified constipation type  Fall, initial encounter  Hyponatremia  Hypokalemia   Will need admission to correct sodium in order to insure it is done over the correct amount of time and to make sure no seizures or falls    Samvel Zinn, MD 07/31/17 [phone]

## 2017-08-01 ENCOUNTER — Inpatient Hospital Stay (HOSPITAL_COMMUNITY): Payer: Medicare Other

## 2017-08-01 ENCOUNTER — Ambulatory Visit: Payer: Self-pay | Admitting: Gastroenterology

## 2017-08-01 DIAGNOSIS — M25551 Pain in right hip: Secondary | ICD-10-CM

## 2017-08-01 DIAGNOSIS — F418 Other specified anxiety disorders: Secondary | ICD-10-CM

## 2017-08-01 LAB — BASIC METABOLIC PANEL
Anion gap: 11 (ref 5–15)
Anion gap: 10 (ref 5–15)
BUN: 7 mg/dL (ref 6–20)
BUN: 9 mg/dL (ref 6–20)
CALCIUM: 9.1 mg/dL (ref 8.9–10.3)
CO2: 23 mmol/L (ref 22–32)
CO2: 21 mmol/L — ABNORMAL LOW (ref 22–32)
CREATININE: 0.64 mg/dL (ref 0.44–1.00)
Calcium: 8.5 mg/dL — ABNORMAL LOW (ref 8.9–10.3)
Chloride: 86 mmol/L — ABNORMAL LOW (ref 101–111)
Chloride: 89 mmol/L — ABNORMAL LOW (ref 101–111)
Creatinine, Ser: 0.51 mg/dL (ref 0.44–1.00)
GFR calc Af Amer: >60 mL/min (ref >60)
GFR calc Af Amer: >60 mL/min (ref >60)
GLUCOSE: 142 mg/dL — AB (ref 65–99)
Glucose, Bld: 165 mg/dL — ABNORMAL HIGH (ref 65–99)
POTASSIUM: 5 mmol/L (ref 3.5–5.1)
POTASSIUM: 4.9 mmol/L (ref 3.5–5.1)
Sodium: 120 mmol/L — ABNORMAL LOW (ref 135–145)
Sodium: 120 mmol/L — ABNORMAL LOW (ref 135–145)

## 2017-08-01 LAB — CBC
HCT: 32.2 % — ABNORMAL LOW (ref 36.0–46.0)
Hemoglobin: 10.9 g/dL — ABNORMAL LOW (ref 12.0–15.0)
MCH: 29.1 pg (ref 26.0–34.0)
MCHC: 33.9 g/dL (ref 30.0–36.0)
MCV: 86.1 fL (ref 78.0–100.0)
PLATELETS: 208 10*3/uL (ref 150–400)
RBC: 3.74 MIL/uL — ABNORMAL LOW (ref 3.87–5.11)
RDW: 13.9 % (ref 11.5–15.5)
WBC: 8.8 10*3/uL (ref 4.0–10.5)

## 2017-08-01 LAB — GLUCOSE, CAPILLARY
GLUCOSE-CAPILLARY: 139 mg/dL — AB (ref 65–99)
Glucose-Capillary: 140 mg/dL — ABNORMAL HIGH (ref 65–99)
Glucose-Capillary: 171 mg/dL — ABNORMAL HIGH (ref 65–99)
Glucose-Capillary: 172 mg/dL — ABNORMAL HIGH (ref 65–99)

## 2017-08-01 LAB — MAGNESIUM: Magnesium: 1.9 mg/dL (ref 1.7–2.4)

## 2017-08-01 LAB — UREA NITROGEN, URINE: UREA NITROGEN UR: 136 mg/dL

## 2017-08-01 MED ORDER — BISACODYL 10 MG RE SUPP
10.0000 mg | Freq: Once | RECTAL | Status: DC
  Filled 2017-08-01: qty 1

## 2017-08-01 MED ORDER — IPRATROPIUM-ALBUTEROL 0.5-2.5 (3) MG/3ML IN SOLN
3.0000 mL | Freq: Three times a day (TID) | RESPIRATORY_TRACT | Status: DC
  Administered 2017-08-01 – 2017-08-08 (×22): 3 mL via RESPIRATORY_TRACT
  Filled 2017-08-01 (×24): qty 3

## 2017-08-01 NOTE — Plan of Care (Signed)
  Education: Knowledge of General Education information will improve 08/01/2017 2234 - Progressing by Tristan Schroeder, RN   Coping: Level of anxiety will decrease 08/01/2017 2234 - Progressing by Tristan Schroeder, RN   Elimination: Will not experience complications related to bowel motility 08/01/2017 2234 - Progressing by Tristan Schroeder, RN   Pain Managment: General experience of comfort will improve 08/01/2017 2234 - Progressing by Tristan Schroeder, RN

## 2017-08-01 NOTE — Plan of Care (Signed)
  Education: Knowledge of General Education information will improve 08/01/2017 0217 - Progressing by Tristan Schroeder, RN   Clinical Measurements: Diagnostic test results will improve 08/01/2017 0217 - Progressing by Tristan Schroeder, RN   Nutrition: Adequate nutrition will be maintained 08/01/2017 0217 - Progressing by Tristan Schroeder, RN   Elimination: Will not experience complications related to bowel motility 08/01/2017 0217 - Progressing by Tristan Schroeder, RN

## 2017-08-01 NOTE — Evaluation (Signed)
Clinical/Bedside Swallow Evaluation Patient Details  Name: Natalie Thomas MRN: [id number] Date of Birth: 16-Sep-1960  Today's Date: 08/01/2017 Time: SLP Start Time (ACUTE ONLY): 1022 SLP Stop Time (ACUTE ONLY): 1039 SLP Time Calculation (min) (ACUTE ONLY): 17 min  Past Medical History:  Past Medical History:  Diagnosis Date  . CHF (congestive heart failure) ([place])   . COPD (chronic obstructive pulmonary disease) ([place])   . Depression   . Diabetes mellitus, type 2 ([place])    pt denies but states that she has been treated for DM  . GERD (gastroesophageal reflux disease)   . Hypertension   . Osteoporosis    Past Surgical History:  Past Surgical History:  Procedure Laterality Date  . EYE SURGERY     on left  eye, pt states that she sees bad out of the right eye and needs surgery there  . PELVIC FRACTURE SURGERY     HPI:  Pt is a 57 y/o female admitted secondary to increased SOB and hyponatremia. Pt also reports fall which resulted in R hip pain. Imaging for R hip negative for acute abnormality. CT of head/spine negative for acute abnormality. PMH includes CHF, HTN, COPD on 2L, DM, GERD and osteoporosis.CXR No active cardiopulmonary disease. Order states "pt on chopped meats at [facility]."   Assessment / Plan / Recommendation Clinical Impression  Pt exhibiting a baseline COPD cough which did increase in frequency following cup/sips thin water. Pt describes possible pharyngeal or cervical esophageal residue. Functional mastication although she eats chopped meats typically due to no dentition. She takes Prilosec normally. Texture downgraded to Dys 3, continue thin, pills whole with thin. Educated pt re: reflux precautions. Will plan to see tomorrow and likely do MBS is continues to be appropriate.    SLP Visit Diagnosis: Dysphagia, unspecified (R13.10)    Aspiration Risk  Mild aspiration risk    Diet Recommendation Dysphagia 3 (Mech soft);Thin liquid   Liquid Administration via:  Cup;Straw Medication Administration: Whole meds with liquid Supervision: Patient able to self feed Compensations: Slow rate;Small sips/bites;Follow solids with liquid Postural Changes: Seated upright at 90 degrees;Remain upright for at least 30 minutes after po intake    Other  Recommendations Oral Care Recommendations: Oral care BID   Follow up Recommendations None      Frequency and Duration min 2x/week  2 weeks       Prognosis Prognosis for Safe Diet Advancement: Good      Swallow Study   General HPI: Pt is a 57 y/o female admitted secondary to increased SOB and hyponatremia. Pt also reports fall which resulted in R hip pain. Imaging for R hip negative for acute abnormality. CT of head/spine negative for acute abnormality. PMH includes CHF, HTN, COPD on 2L, DM, GERD and osteoporosis.CXR No active cardiopulmonary disease. Order states "pt on chopped meats at [facility] Type of Study: Bedside Swallow Evaluation Previous Swallow Assessment: (none) Diet Prior to this Study: Thin liquids;Other (Comment)(soft) Temperature Spikes Noted: No Respiratory Status: Nasal cannula History of Recent Intubation: No Behavior/Cognition: Cooperative;Alert;Pleasant mood Oral Cavity Assessment: Within Functional Limits Oral Care Completed by SLP: No Oral Cavity - Dentition: Edentulous Vision: Functional for self-feeding Self-Feeding Abilities: Able to feed self Patient Positioning: Upright in bed Baseline Vocal Quality: Normal Volitional Cough: Strong Volitional Swallow: Able to elicit    Oral/Motor/Sensory Function Overall Oral Motor/Sensory Function: Within functional limits   Ice Chips Ice chips: Not tested   Thin Liquid Thin Liquid: Impaired Presentation: Cup;Straw Pharyngeal  Phase Impairments: Cough -  Immediate;Cough - Delayed    Nectar Thick Nectar Thick Liquid: Not tested   Honey Thick Honey Thick Liquid: Not tested   Puree Puree: Not tested   Solid   GO   Solid:  Within functional limits        Mick Sell, Orbie Pyo 08/01/2017,10:50 AM Orbie Pyo Colvin Caroli.Ed [employer] [phone]

## 2017-08-01 NOTE — Progress Notes (Signed)
PROGRESS NOTE    Natalie Thomas  BDZ:[id number] DOB: July 28, 1960 DOA: 07/30/2017 PCP: Renato Shin, MD   Chief Complaint  Patient presents with  . Shortness of Breath  . Fall  . Constipation    X 1 WEEK    Brief Narrative:  HPI on 07/31/2017 by Dr. Gerlean Ren Natalie Thomas is a 57 y.o. female with medical history significant of COPD, GERD, depression, hypertension came to the ER with complaints of right hip pain after fall.  Patient states that yesterday afternoon while transferring herself from the wheelchair she ended up sustaining a fall on the right side hurting her right hip.  She denies any loss of consciousness or any other warning signs prior to the fall.  She states it was purely mechanical fall as she lost her balance.  Since then she has had right-sided hip pain.  During this time she also states she has noticed her shortness of breath has worsened and has some coughing but unable to bring up any sputum.  Denies any fevers, chills and other complaints.  She has not had a bowel movement in over a week now but thinks this may be due to poor p.o. appetite.  Interim history Admitted for hyponatremia, COPD exacerbation and right hip pain. Assessment & Plan   Acute COPD exacerbation -Continue Solu-Medrol, supplemental oxygen as needed, nebulizer treatments -Chest x-ray unremarkable for infection -Patient currently afebrile, no leukocytosis  Hyponatremia -Baseline sodium approximately 130 -Unknown etiology -Denies alcohol use -Sodium on admission 114, currently up to 120 -Urine electrolytes: Urine sodium 65, urine osmolality 275 -Continue normal saline and sodium tablets  Right-sided hip pain secondary to mechanical fall -CT head/cervical spine unremarkable -Right hip x-ray showed no definite acute abnormality -Continue pain control -Continues to complain of right hip pain, will obtain CT scan -PT/OT consulted  Constipation -KUB suggest large stool burden -Continue  bowel regimen-continue scheduled MiraLAX, as needed Dulcolax -Will add on suppository  Hypokalemia/hypomagnesemia -Potassium 5 today. -Pending magnesium level, will replace as needed -Continue to monitor  Essential hypertension -Lasix held -Continue clonidine, nifedipine, Coreg, hydralazine  Depression/anxiety -Continue Xanax, Wellbutrin, Latuda, depakote, Paxil  Hyperlipidemia -Continue statin  GERD -Continue PPI  Diabetes mellitus, type II -Currently on no home medications, continue insulin sliding scale and CBG monitoring  DVT Prophylaxis  heparin  Code Status: Full  Family Communication: None at bedside  Disposition Plan: Admitted.  Pending improvement in COPD as well as sodium level.  Will obtain CT scan hip.  Consultants none  Procedures  none  Antibiotics   Anti-infectives (From admission, onward)   None      Subjective:   [name] seen and examined today.  Continues to complain of right hip pain.  Felt shortness of breath last night and some wheezing this morning.  Denies chest pain, abdominal pain.  Continues to feel constipated.  States she is very anxious today.  Objective:   Vitals:   08/01/17 0026 08/01/17 0529 08/01/17 0628 08/01/17 0943  BP: (!) 111/94 139/76    Pulse: 66 67    Resp: 20 18    Temp: (!) 97.5 F (36.4 C) 98 F (36.7 C)    TempSrc: Oral Oral    SpO2: 98% 100% 100% 97%  Weight:  94.2 kg (207 lb 10.8 oz)    Height:        Intake/Output Summary (Last 24 hours) at 08/01/2017 0958 Last data filed at 08/01/2017 3419 Gross per 24 hour  Intake 2978.33 ml  Output  725 ml  Net 2253.33 ml   Filed Weights   07/31/17 1538 08/01/17 0529  Weight: 92.3 kg (203 lb 7.8 oz) 94.2 kg (207 lb 10.8 oz)   Exam  General: Well developed, chronically ill-appearing, no apparent distress  HEENT: NCAT, mucous membranes moist.   Cardiovascular: S1 S2 auscultated, RRR, no murmur  Respiratory: Diminished breath sounds, expiratory  wheezing  Abdomen: Soft, nontender, nondistended, + bowel sounds  Extremities: warm dry without cyanosis clubbing or edema  Neuro: AAOx3, nonfocal  Psych: anxious   Data Reviewed: I have personally reviewed following labs and imaging studies  CBC: Recent Labs  Lab 07/30/17 2105 07/31/17 0205 07/31/17 0345 08/01/17 0605  WBC 12.9*  --  10.0 8.8  HGB 11.4* 12.2 11.9* 10.9*  HCT 32.6* 36.0 33.4* 32.2*  MCV 83.8  --  83.7 86.1  PLT 185  --  196 409   Basic Metabolic Panel: Recent Labs  Lab 07/30/17 2105 07/31/17 0205 07/31/17 0345 07/31/17 0542 07/31/17 1534 07/31/17 2304 08/01/17 0605  NA 114* 115*  --  117* 116* 120* 120*  K 3.3* 3.3*  --  3.7 4.4 5.3* 5.0  CL 79* 77*  --  83* 84* 87* 86*  CO2 22  --   --  22 20* 23 23  GLUCOSE 141* 123*  --  104* 187* 154* 142*  BUN <5* <3*  --  <5* <5* 8 7  CREATININE 0.38* 0.30* 0.37* 0.40* 0.48 0.49 0.51  CALCIUM 8.3*  --   --  8.6* 8.7* 8.7* 9.1  MG  --   --  1.4*  --   --   --   --    GFR: Estimated Creatinine Clearance: 90.2 mL/min (by C-G formula based on SCr of 0.51 mg/dL). Liver Function Tests: Recent Labs  Lab 07/30/17 2105  AST 19  ALT 19  ALKPHOS 73  BILITOT 0.6  PROT 6.5  ALBUMIN 3.1*   No results for input(s): LIPASE, AMYLASE in the last 168 hours. No results for input(s): AMMONIA in the last 168 hours. Coagulation Profile: No results for input(s): INR, PROTIME in the last 168 hours. Cardiac Enzymes: No results for input(s): CKTOTAL, CKMB, CKMBINDEX, TROPONINI in the last 168 hours. BNP (last 3 results) No results for input(s): PROBNP in the last 8760 hours. HbA1C: No results for input(s): HGBA1C in the last 72 hours. CBG: Recent Labs  Lab 07/31/17 0847 07/31/17 1228 07/31/17 1616 07/31/17 2216 08/01/17 0722  GLUCAP 135* 194* 182* 148* 140*   Lipid Profile: No results for input(s): CHOL, HDL, LDLCALC, TRIG, CHOLHDL, LDLDIRECT in the last 72 hours. Thyroid Function Tests: Recent Labs     07/31/17 0346  TSH 0.808   Anemia Panel: No results for input(s): VITAMINB12, FOLATE, FERRITIN, TIBC, IRON, RETICCTPCT in the last 72 hours. Urine analysis:    Component Value Date/Time   COLORURINE YELLOW 07/31/2017 0114   APPEARANCEUR CLEAR 07/31/2017 0114   APPEARANCEUR Clear 06/22/2012 0618   LABSPEC 1.008 07/31/2017 0114   LABSPEC 1.003 06/22/2012 0618   PHURINE 7.0 07/31/2017 0114   GLUCOSEU 50 (A) 07/31/2017 0114   GLUCOSEU Negative 06/22/2012 0618   GLUCOSEU NEGATIVE 04/09/2011 1058   HGBUR NEGATIVE 07/31/2017 0114   BILIRUBINUR NEGATIVE 07/31/2017 0114   BILIRUBINUR Negative 06/22/2012 0618   KETONESUR 5 (A) 07/31/2017 0114   PROTEINUR NEGATIVE 07/31/2017 0114   UROBILINOGEN 1.0 08/18/2011 1805   NITRITE NEGATIVE 07/31/2017 0114   LEUKOCYTESUR NEGATIVE 07/31/2017 0114   LEUKOCYTESUR Trace 06/22/2012 0618  Sepsis Labs: @LABRCNTIP (procalcitonin:4,lacticidven:4)  ) Recent Results (from the past 240 hour(s))  MRSA PCR Screening     Status: None   Collection Time: 07/31/17  4:35 PM  Result Value Ref Range Status   MRSA by PCR NEGATIVE NEGATIVE Final    Comment:        The GeneXpert MRSA Assay (FDA approved for NASAL specimens only), is one component of a comprehensive MRSA colonization surveillance program. It is not intended to diagnose MRSA infection nor to guide or monitor treatment for MRSA infections. Performed at [facility], [place] [address], [place], [place] [address]       Radiology Studies: Dg Chest 2 View  Result Date: 07/30/2017 CLINICAL DATA:  Shortness of breath and chest tightness. EXAM: CHEST  2 VIEW COMPARISON:  July 25, 2017 FINDINGS: Stable cardiomegaly. The hila, mediastinum, lungs, and pleura are otherwise normal. Anterior wedging of multiple midthoracic vertebral bodies is stable. IMPRESSION: No active cardiopulmonary disease. Electronically Signed   By: Dorise Bullion III M.D   On: 07/30/2017 20:42   Dg Abdomen 1  View  Result Date: 07/31/2017 CLINICAL DATA:  Constipation EXAM: ABDOMEN - 1 VIEW COMPARISON:  CT 07/25/2017 FINDINGS: Visible lung bases are clear. Surgical clips in the right upper quadrant. Nonobstructed gas pattern with large amount of stool in the colon. Radiopaque material over the sacrum. No abnormal calcification IMPRESSION: Nonobstructed gas pattern with large amount of stool in the colon. Electronically Signed   By: Donavan Foil M.D.   On: 07/31/2017 02:37   Ct Head Wo Contrast  Result Date: 07/31/2017 CLINICAL DATA:  Status post fall onto right side, with right-sided head and neck pain. EXAM: CT HEAD WITHOUT CONTRAST CT CERVICAL SPINE WITHOUT CONTRAST TECHNIQUE: Multidetector CT imaging of the head and cervical spine was performed following the standard protocol without intravenous contrast. Multiplanar CT image reconstructions of the cervical spine were also generated. COMPARISON:  None. FINDINGS: CT HEAD FINDINGS Brain: No evidence of acute infarction, hemorrhage, hydrocephalus, extra-axial collection or mass lesion/mass effect. The posterior fossa, including the cerebellum, brainstem and fourth ventricle, is within normal limits. The third and lateral ventricles, and basal ganglia are unremarkable in appearance. The cerebral hemispheres are symmetric in appearance, with normal gray-white differentiation. No mass effect or midline shift is seen. Vascular: No hyperdense vessel or unexpected calcification. Skull: There is no evidence of fracture; visualized osseous structures are unremarkable in appearance. Sinuses/Orbits: The orbits are within normal limits. There is complete opacification of the mastoid air cells bilaterally, with trace fluid tracking about the middle ears bilaterally. The paranasal sinuses are well-aerated. Other: No significant soft tissue abnormalities are seen. CT CERVICAL SPINE FINDINGS Alignment: Normal. Skull base and vertebrae: No acute fracture. No primary bone lesion or  focal pathologic process. Soft tissues and spinal canal: No prevertebral fluid or swelling. No visible canal hematoma. Disc levels: Intervertebral disc spaces are preserved. The bony foramina are grossly unremarkable in appearance. Upper chest: The visualized lung apices are clear. The visualized portions of the thyroid gland are grossly unremarkable. Scattered calcification is noted at the carotid bifurcations bilaterally. Other: No additional soft tissue abnormalities are seen. IMPRESSION: 1. No evidence of traumatic intracranial injury or fracture. 2. No evidence of fracture or subluxation along the cervical spine. 3. Complete opacification of the mastoid air cells bilaterally, with trace fluid tracking about the middle ears bilaterally. Would correlate clinically for any evidence of ear infection. 4. Scattered calcification at the carotid bifurcations bilaterally. Carotid ultrasound would be  helpful for further evaluation, when and as deemed clinically appropriate. Electronically Signed   By: Garald Balding M.D.   On: 07/31/2017 02:54   Ct Cervical Spine Wo Contrast  Result Date: 07/31/2017 CLINICAL DATA:  Status post fall onto right side, with right-sided head and neck pain. EXAM: CT HEAD WITHOUT CONTRAST CT CERVICAL SPINE WITHOUT CONTRAST TECHNIQUE: Multidetector CT imaging of the head and cervical spine was performed following the standard protocol without intravenous contrast. Multiplanar CT image reconstructions of the cervical spine were also generated. COMPARISON:  None. FINDINGS: CT HEAD FINDINGS Brain: No evidence of acute infarction, hemorrhage, hydrocephalus, extra-axial collection or mass lesion/mass effect. The posterior fossa, including the cerebellum, brainstem and fourth ventricle, is within normal limits. The third and lateral ventricles, and basal ganglia are unremarkable in appearance. The cerebral hemispheres are symmetric in appearance, with normal gray-white differentiation. No mass  effect or midline shift is seen. Vascular: No hyperdense vessel or unexpected calcification. Skull: There is no evidence of fracture; visualized osseous structures are unremarkable in appearance. Sinuses/Orbits: The orbits are within normal limits. There is complete opacification of the mastoid air cells bilaterally, with trace fluid tracking about the middle ears bilaterally. The paranasal sinuses are well-aerated. Other: No significant soft tissue abnormalities are seen. CT CERVICAL SPINE FINDINGS Alignment: Normal. Skull base and vertebrae: No acute fracture. No primary bone lesion or focal pathologic process. Soft tissues and spinal canal: No prevertebral fluid or swelling. No visible canal hematoma. Disc levels: Intervertebral disc spaces are preserved. The bony foramina are grossly unremarkable in appearance. Upper chest: The visualized lung apices are clear. The visualized portions of the thyroid gland are grossly unremarkable. Scattered calcification is noted at the carotid bifurcations bilaterally. Other: No additional soft tissue abnormalities are seen. IMPRESSION: 1. No evidence of traumatic intracranial injury or fracture. 2. No evidence of fracture or subluxation along the cervical spine. 3. Complete opacification of the mastoid air cells bilaterally, with trace fluid tracking about the middle ears bilaterally. Would correlate clinically for any evidence of ear infection. 4. Scattered calcification at the carotid bifurcations bilaterally. Carotid ultrasound would be helpful for further evaluation, when and as deemed clinically appropriate. Electronically Signed   By: Garald Balding M.D.   On: 07/31/2017 02:54   Dg Hip Unilat With Pelvis 2-3 Views Right  Result Date: 07/31/2017 CLINICAL DATA:  Fall with right-sided pain EXAM: DG HIP (WITH OR WITHOUT PELVIS) 2-3V RIGHT COMPARISON:  CT 07/25/2017 FINDINGS: Radiopaque material at the sacrum. Pubic symphysis and rami appear intact. There is no acute  displaced fracture or malalignment. IMPRESSION: No definite acute osseous abnormality. Electronically Signed   By: Donavan Foil M.D.   On: 07/31/2017 02:37     Scheduled Meds: . ALPRAZolam  0.5 mg Oral TID  . aspirin EC  81 mg Oral Daily  . buPROPion  150 mg Oral Daily  . carvedilol  25 mg Oral BID WC  . [START ON 08/02/2017] cloNIDine  0.3 mg Transdermal Weekly  . divalproex  500 mg Oral BID  . docusate sodium  100 mg Oral BID  . ferrous sulfate  325 mg Oral Q breakfast  . fluticasone  2 spray Each Nare BID  . fluticasone furoate-vilanterol  1 puff Inhalation Daily  . heparin  5,000 Units Subcutaneous Q8H  . hydrALAZINE  100 mg Oral Q8H  . Influenza vac split quadrivalent PF  0.5 mL Intramuscular Tomorrow-1000  . insulin aspart  0-5 Units Subcutaneous QHS  . insulin aspart  0-9 Units  Subcutaneous TID WC  . ipratropium-albuterol  3 mL Nebulization TID  . lurasidone  80 mg Oral Q breakfast  . Melatonin  9 mg Oral QHS  . methylPREDNISolone (SOLU-MEDROL) injection  40 mg Intravenous Q6H  . multivitamin with minerals   Oral Daily  . NIFEdipine  90 mg Oral Daily  . pantoprazole  40 mg Oral Daily  . PARoxetine  40 mg Oral Daily  . polyethylene glycol  17 g Oral Daily  . pravastatin  20 mg Oral Daily  . sodium chloride  1 g Oral TID WC  . sucralfate  1 g Oral QID   Continuous Infusions: . sodium chloride 100 mL/hr at 08/01/17 0259     LOS: 1 day   Time Spent in minutes   30 minutes  Laurrie Toppin D.O. on 08/01/2017 at 9:58 AM  Between 7am to 7pm - Pager - [phone]  After 7pm go to [email or web address] - password [email or web address]  And look for the night coverage person covering for me after hours  [facility] Office  [phone]

## 2017-08-02 LAB — CBC
HCT: 31.9 % — ABNORMAL LOW (ref 36.0–46.0)
Hemoglobin: 10.7 g/dL — ABNORMAL LOW (ref 12.0–15.0)
MCH: 29.2 pg (ref 26.0–34.0)
MCHC: 33.5 g/dL (ref 30.0–36.0)
MCV: 86.9 fL (ref 78.0–100.0)
Platelets: 210 10*3/uL (ref 150–400)
RBC: 3.67 MIL/uL — AB (ref 3.87–5.11)
RDW: 13.9 % (ref 11.5–15.5)
WBC: 10.6 10*3/uL — ABNORMAL HIGH (ref 4.0–10.5)

## 2017-08-02 LAB — BASIC METABOLIC PANEL
Anion gap: 10 (ref 5–15)
BUN: 9 mg/dL (ref 6–20)
CO2: 22 mmol/L (ref 22–32)
Calcium: 8.6 mg/dL — ABNORMAL LOW (ref 8.9–10.3)
Chloride: 88 mmol/L — ABNORMAL LOW (ref 101–111)
Creatinine, Ser: 0.55 mg/dL (ref 0.44–1.00)
GFR calc Af Amer: >60 mL/min (ref >60)
GFR calc non Af Amer: >60 mL/min (ref >60)
Glucose, Bld: 136 mg/dL — ABNORMAL HIGH (ref 65–99)
Potassium: 5.2 mmol/L — ABNORMAL HIGH (ref 3.5–5.1)
SODIUM: 120 mmol/L — AB (ref 135–145)

## 2017-08-02 LAB — GLUCOSE, CAPILLARY
GLUCOSE-CAPILLARY: 132 mg/dL — AB (ref 65–99)
GLUCOSE-CAPILLARY: 145 mg/dL — AB (ref 65–99)
GLUCOSE-CAPILLARY: 166 mg/dL — AB (ref 65–99)
Glucose-Capillary: 136 mg/dL — ABNORMAL HIGH (ref 65–99)

## 2017-08-02 LAB — OSMOLALITY: OSMOLALITY: 239 mosm/kg — AB (ref 275–295)

## 2017-08-02 MED ORDER — SALINE SPRAY 0.65 % NA SOLN
1.0000 | NASAL | Status: DC | PRN
  Administered 2017-08-03: 1 via NASAL
  Filled 2017-08-02: qty 44

## 2017-08-02 MED ORDER — SODIUM CHLORIDE 1 G PO TABS
2.0000 g | ORAL_TABLET | Freq: Three times a day (TID) | ORAL | Status: DC
  Administered 2017-08-02 – 2017-08-03 (×4): 2 g via ORAL
  Filled 2017-08-02 (×5): qty 2

## 2017-08-02 MED ORDER — FUROSEMIDE 10 MG/ML IJ SOLN
10.0000 mg | Freq: Every day | INTRAMUSCULAR | Status: DC
  Administered 2017-08-02: 20 mg via INTRAVENOUS
  Filled 2017-08-02: qty 2

## 2017-08-02 MED ORDER — FUROSEMIDE 10 MG/ML IJ SOLN
20.0000 mg | Freq: Every day | INTRAMUSCULAR | Status: DC
  Administered 2017-08-03 – 2017-08-11 (×9): 20 mg via INTRAVENOUS
  Filled 2017-08-02 (×9): qty 2

## 2017-08-02 NOTE — Progress Notes (Signed)
Physical Therapy Treatment Patient Details Name: Natalie Thomas MRN: [id number] DOB: September 23, 1960 Today's Date: 08/02/2017    History of Present Illness Pt is a 57 y/o female admitted secondary to increased SOB and hyponatremia. Pt also reports fall which resulted in R hip pain. Imaging for R hip negative for acute abnormality. CT of head/spine negative for acute abnormality. PMH includes CHF, HTN, COPD on 2L, DM, and osteoporosis.     PT Comments    Pt performed increased gait with max cues for encouragement to progress mobility this pm.  Pt lacks motivation due to fear.  Plan next session for continued gait and introduction of therapeutic exercise. SNF remains appropriate at this time.    Follow Up Recommendations  SNF;Supervision/Assistance - 24 hour     Equipment Recommendations  None recommended by PT    Recommendations for Other Services       Precautions / Restrictions Precautions Precautions: Fall Precaution Comments: Pt reports fall from [facility] after not locking her brakes on her WC.  Restrictions Weight Bearing Restrictions: No    Mobility  Bed Mobility Overal bed mobility: Needs Assistance Bed Mobility: Supine to Sit Rolling: Min guard         General bed mobility comments: Pt able to advance LEs to edge of bed and elevate trunk into sitting with HOB elevated.  Pt reports bowel incontinence.    Transfers Overall transfer level: Needs assistance Equipment used: Rolling walker (2 wheeled) Transfers: Sit to/from Stand Sit to Stand: Min assist         General transfer comment: Pt required cues for hand placement for safety to and from seated surface.  Pt performed with poor eccentric loading to seated surface.    Ambulation/Gait Ambulation/Gait assistance: Min assist Ambulation Distance (Feet): 30 Feet Assistive device: Rolling walker (2 wheeled) Gait Pattern/deviations: Step-to pattern;Decreased stride length;Wide base of support;Antalgic     General  Gait Details: Cues for RW safety and sequencing to reduce pain in RLE.     Stairs            Wheelchair Mobility    Modified Rankin (Stroke Patients Only)       Balance Overall balance assessment: Needs assistance   Sitting balance-Leahy Scale: Fair       Standing balance-Leahy Scale: Poor                              Cognition Arousal/Alertness: Awake/alert Behavior During Therapy: WFL for tasks assessed/performed Overall Cognitive Status: Within Functional Limits for tasks assessed                                        Exercises      General Comments        Pertinent Vitals/Pain Pain Assessment: 0-10 Pain Score: 7  Pain Location: R hip pain  Pain Descriptors / Indicators: Sharp Pain Intervention(s): Monitored during session;Repositioned    Home Living                      Prior Function            PT Goals (current goals can now be found in the care plan section) Acute Rehab PT Goals Patient Stated Goal: to decrease pain  Potential to Achieve Goals: Fair Progress towards PT goals: Progressing toward goals    Frequency  Min 2X/week      PT Plan Current plan remains appropriate    Co-evaluation              AM-PAC PT "6 Clicks" Daily Activity  Outcome Measure  Difficulty turning over in bed (including adjusting bedclothes, sheets and blankets)?: Unable Difficulty moving from lying on back to sitting on the side of the bed? : Unable Difficulty sitting down on and standing up from a chair with arms (e.g., wheelchair, bedside commode, etc,.)?: Unable Help needed moving to and from a bed to chair (including a wheelchair)?: A Little Help needed walking in hospital room?: A Little Help needed climbing 3-5 steps with a railing? : A Little 6 Click Score: 12    End of Session Equipment Utilized During Treatment: Gait belt Activity Tolerance: Patient limited by pain Patient left: with call  bell/phone within reach;in chair Nurse Communication: Mobility status PT Visit Diagnosis: Other abnormalities of gait and mobility (R26.89);Muscle weakness (generalized) (M62.81);Pain Pain - Right/Left: Right Pain - part of body: Hip     Time: 2924-4628 PT Time Calculation (min) (ACUTE ONLY): 26 min  Charges:  $Gait Training: 8-22 mins $Therapeutic Activity: 8-22 mins                    G Codes:       Governor Rooks, PTA pager [phone]    Cristela Blue 08/02/2017, 5:25 PM

## 2017-08-02 NOTE — Progress Notes (Signed)
PROGRESS NOTE    Natalie Thomas  IRC:[id number] DOB: 01-04-1961 DOA: 07/30/2017 PCP: Renato Shin, MD   Chief Complaint  Patient presents with  . Shortness of Breath  . Fall  . Constipation    X 1 WEEK    Brief Narrative:  HPI on 07/31/2017 by Dr. Gerlean Ren Natalie Thomas is a 57 y.o. female with medical history significant of COPD, GERD, depression, hypertension came to the ER with complaints of right hip pain after fall.  Patient states that yesterday afternoon while transferring herself from the wheelchair she ended up sustaining a fall on the right side hurting her right hip.  She denies any loss of consciousness or any other warning signs prior to the fall.  She states it was purely mechanical fall as she lost her balance.  Since then she has had right-sided hip pain.  During this time she also states she has noticed her shortness of breath has worsened and has some coughing but unable to bring up any sputum.  Denies any fevers, chills and other complaints.  She has not had a bowel movement in over a week now but thinks this may be due to poor p.o. appetite.  Interim history Admitted for hyponatremia, COPD exacerbation and right hip pain. Assessment & Plan   Acute COPD exacerbation -Continue Solu-Medrol, supplemental oxygen as needed, nebulizer treatments -Chest x-ray unremarkable for infection -Patient currently afebrile, no leukocytosis  Hyponatremia -Baseline sodium approximately 130 -Unknown etiology- possibly due to antidepressant medications (patient is on Prozac, wellbutrin, latuda) -Denies alcohol use -Sodium on admission 114, currently up to 120 -Urine electrolytes: Urine sodium 65, urine osmolality 275 -was placed on normal saline and sodium tablets -discussed with nephrology, Dr. Posey Pronto, recommended discontinuing IVF and increasing sodium tablets to 2tabs TID, small dose of IV Lasix  Right-sided hip pain secondary to mechanical fall/right superior pubic ramus  fracture -CT head/cervical spine unremarkable -Right hip x-ray showed no definite acute abnormality -Continue pain control -CT right hip: acute nondisplaced fracture of the right superior pubic ramus -PT/OT consulted-recommended SNF -Discussed with orthopedics, patient does not necessarily need outpatient follow-up.  Allow 6 weeks to heal.  Constipation -KUB suggest large stool burden -Patient was able to have bowel movement on 08/01/2017 -Continue bowel regimen-continue scheduled MiraLAX, as needed Dulcolax  Hypokalemia/hypomagnesemia -Resolved with repletion, continue to monitor and replace as needed  Essential hypertension -Lasix initially held -Continue clonidine, nifedipine, Coreg, hydralazine  Depression/anxiety -Continue Xanax, Wellbutrin, Latuda, depakote, Paxil -There is some overlap with these medications, patient should follow-up with her psychiatrist or psychologist as an outpatient better medication management  Hyperlipidemia -Continue statin  GERD -Continue PPI  Diabetes mellitus, type II -Currently on no home medications, continue insulin sliding scale and CBG monitoring  Right ear bleeding -Secondary to patient placing her finger in her ear and picking at it. -Continue to monitor closely  DVT Prophylaxis  heparin  Code Status: Full  Family Communication: None at bedside  Disposition Plan: Admitted.  Pending improvement in COPD as well as sodium level.  Sniffer upon discharge  Consultants Nephrology, via phone, Dr. Posey Pronto Orthopedic via phone  Procedures  none continues to complain of  Antibiotics   Anti-infectives (From admission, onward)   None      Subjective:   Natalie Thomas seen and examined today.  Right hip and leg pain.  Also complains of bleeding from her right ear after picking at it yesterday evening.  Continues to have shortness of breath although appears to be  comfortable.  Denies current chest pain, abdominal pain, nausea or  vomiting.  Was able to have a bowel movement yesterday.  Objective:   Vitals:   08/02/17 0423 08/02/17 0531 08/02/17 0759 08/02/17 0800  BP: (!) 191/87 (!) 158/67  (!) 196/99  Pulse: 74   64  Resp: 18   18  Temp: 97.8 F (36.6 C)   (!) 97.5 F (36.4 C)  TempSrc: Oral   Axillary  SpO2: 98%  99% 100%  Weight: 95.7 kg (211 lb)     Height:        Intake/Output Summary (Last 24 hours) at 08/02/2017 1053 Last data filed at 08/02/2017 0900 Gross per 24 hour  Intake 3588.33 ml  Output 3050 ml  Net 538.33 ml   Filed Weights   07/31/17 1538 08/01/17 0529 08/02/17 0423  Weight: 92.3 kg (203 lb 7.8 oz) 94.2 kg (207 lb 10.8 oz) 95.7 kg (211 lb)   Exam  General: Well developed, chronically ill-appearing, NAD  HEENT: NCAT, PERRLA, EOMI, Anicteic Sclera, mucous membranes moist.   Cardiovascular: S1 S2 auscultated, no rubs, murmurs or gallops. Regular rate and rhythm.  Respiratory:  Diminished breath sounds, end expiratory wheezing  Abdomen: Soft, nontender, nondistended, + bowel sounds  Extremities: warm dry without cyanosis clubbing or edema  Neuro: AAOx3, nonfocal  Psych: anxious  Data Reviewed: I have personally reviewed following labs and imaging studies  CBC: Recent Labs  Lab 07/30/17 2105 07/31/17 0205 07/31/17 0345 08/01/17 0605 08/02/17 0451  WBC 12.9*  --  10.0 8.8 10.6*  HGB 11.4* 12.2 11.9* 10.9* 10.7*  HCT 32.6* 36.0 33.4* 32.2* 31.9*  MCV 83.8  --  83.7 86.1 86.9  PLT 185  --  196 208 885   Basic Metabolic Panel: Recent Labs  Lab 07/31/17 0345  07/31/17 1534 07/31/17 2304 08/01/17 0605 08/01/17 1424 08/02/17 0451  NA  --    < > 116* 120* 120* 120* 120*  K  --    < > 4.4 5.3* 5.0 4.9 5.2*  CL  --    < > 84* 87* 86* 89* 88*  CO2  --    < > 20* 23 23 21* 22  GLUCOSE  --    < > 187* 154* 142* 165* 136*  BUN  --    < > <5* 8 7 9 9   CREATININE 0.37*   < > 0.48 0.49 0.51 0.64 0.55  CALCIUM  --    < > 8.7* 8.7* 9.1 8.5* 8.6*  MG 1.4*  --   --   --   --   1.9  --    < > = values in this interval not displayed.   GFR: Estimated Creatinine Clearance: 90.9 mL/min (by C-G formula based on SCr of 0.55 mg/dL). Liver Function Tests: Recent Labs  Lab 07/30/17 2105  AST 19  ALT 19  ALKPHOS 73  BILITOT 0.6  PROT 6.5  ALBUMIN 3.1*   No results for input(s): LIPASE, AMYLASE in the last 168 hours. No results for input(s): AMMONIA in the last 168 hours. Coagulation Profile: No results for input(s): INR, PROTIME in the last 168 hours. Cardiac Enzymes: No results for input(s): CKTOTAL, CKMB, CKMBINDEX, TROPONINI in the last 168 hours. BNP (last 3 results) No results for input(s): PROBNP in the last 8760 hours. HbA1C: No results for input(s): HGBA1C in the last 72 hours. CBG: Recent Labs  Lab 08/01/17 0722 08/01/17 1139 08/01/17 1615 08/01/17 2117 08/02/17 0740  GLUCAP 140* 171* 139*  172* 132*   Lipid Profile: No results for input(s): CHOL, HDL, LDLCALC, TRIG, CHOLHDL, LDLDIRECT in the last 72 hours. Thyroid Function Tests: Recent Labs    07/31/17 0346  TSH 0.808   Anemia Panel: No results for input(s): VITAMINB12, FOLATE, FERRITIN, TIBC, IRON, RETICCTPCT in the last 72 hours. Urine analysis:    Component Value Date/Time   COLORURINE YELLOW 07/31/2017 0114   APPEARANCEUR CLEAR 07/31/2017 0114   APPEARANCEUR Clear 06/22/2012 0618   LABSPEC 1.008 07/31/2017 0114   LABSPEC 1.003 06/22/2012 0618   PHURINE 7.0 07/31/2017 0114   GLUCOSEU 50 (A) 07/31/2017 0114   GLUCOSEU Negative 06/22/2012 0618   GLUCOSEU NEGATIVE 04/09/2011 1058   HGBUR NEGATIVE 07/31/2017 0114   BILIRUBINUR NEGATIVE 07/31/2017 0114   BILIRUBINUR Negative 06/22/2012 0618   KETONESUR 5 (A) 07/31/2017 0114   PROTEINUR NEGATIVE 07/31/2017 0114   UROBILINOGEN 1.0 08/18/2011 1805   NITRITE NEGATIVE 07/31/2017 0114   LEUKOCYTESUR NEGATIVE 07/31/2017 0114   LEUKOCYTESUR Trace 06/22/2012 0618   Sepsis  Labs: @LABRCNTIP (procalcitonin:4,lacticidven:4)  ) Recent Results (from the past 240 hour(s))  MRSA PCR Screening     Status: None   Collection Time: 07/31/17  4:35 PM  Result Value Ref Range Status   MRSA by PCR NEGATIVE NEGATIVE Final    Comment:        The GeneXpert MRSA Assay (FDA approved for NASAL specimens only), is one component of a comprehensive MRSA colonization surveillance program. It is not intended to diagnose MRSA infection nor to guide or monitor treatment for MRSA infections. Performed at [facility], [place] [address] [place], [place] [address]       Radiology Studies: Ct Hip Right Wo Contrast  Result Date: 08/01/2017 CLINICAL DATA:  Status post fall 2 days ago.  Right hip pain. EXAM: CT OF THE RIGHT HIP WITHOUT CONTRAST TECHNIQUE: Multidetector CT imaging of the right hip was performed according to the standard protocol. Multiplanar CT image reconstructions were also generated. COMPARISON:  None. FINDINGS: Bones/Joint/Cartilage Nondisplaced fracture of the right superior pubic ramus. No other fracture or dislocation. Normal alignment. No joint effusion. Prior right sacroplasty. Severe bilateral facet arthropathy at L5-S1. Ligaments Ligaments are suboptimally evaluated by CT. Muscles and Tendons Muscles are normal.  No muscle atrophy. Soft tissue No fluid collection or hematoma. No soft tissue mass. Soft tissue contusion overlying the right greater trochanter. IMPRESSION: 1. Acute nondisplaced fracture of the right superior pubic ramus. Electronically Signed   By: Kathreen Devoid   On: 08/01/2017 15:03     Scheduled Meds: . ALPRAZolam  0.5 mg Oral TID  . aspirin EC  81 mg Oral Daily  . bisacodyl  10 mg Rectal Once  . buPROPion  150 mg Oral Daily  . carvedilol  25 mg Oral BID WC  . cloNIDine  0.3 mg Transdermal Weekly  . divalproex  500 mg Oral BID  . docusate sodium  100 mg Oral BID  . ferrous sulfate  325 mg Oral Q breakfast  . fluticasone  2 spray Each  Nare BID  . fluticasone furoate-vilanterol  1 puff Inhalation Daily  . heparin  5,000 Units Subcutaneous Q8H  . hydrALAZINE  100 mg Oral Q8H  . insulin aspart  0-5 Units Subcutaneous QHS  . insulin aspart  0-9 Units Subcutaneous TID WC  . ipratropium-albuterol  3 mL Nebulization TID  . lurasidone  80 mg Oral Q breakfast  . Melatonin  9 mg Oral QHS  . methylPREDNISolone (SOLU-MEDROL) injection  40 mg  Intravenous Q6H  . multivitamin with minerals   Oral Daily  . NIFEdipine  90 mg Oral Daily  . pantoprazole  40 mg Oral Daily  . PARoxetine  40 mg Oral Daily  . polyethylene glycol  17 g Oral Daily  . pravastatin  20 mg Oral Daily  . sodium chloride  1 g Oral TID WC  . sucralfate  1 g Oral QID   Continuous Infusions: . sodium chloride 100 mL/hr at 08/02/17 0126     LOS: 2 days   Time Spent in minutes   30 minutes  Santino Kinsella D.O. on 08/02/2017 at 10:53 AM  Between 7am to 7pm - Pager - [phone]  After 7pm go to [email or web address] - password [email or web address]  And look for the night coverage person covering for me after hours  [facility] Office  [phone]

## 2017-08-02 NOTE — Progress Notes (Signed)
Patient complaining of bleeding in right ear, says she "was trying to get wax out". RN looked into ear with a light and saw blood. Gave patient tissue to hold pressure on the area. Will continue to monitor.

## 2017-08-02 NOTE — Clinical Social Work Note (Signed)
Clinical Social Work Assessment  Patient Details  Name: Natalie Thomas MRN: [id number] Date of Birth: [birth year]  Date of referral:  08/02/17               Reason for consult:  Facility Placement, Discharge Planning                Permission sought to share information with:  Facility Sport and exercise psychologist, Guardian Permission granted to share information::  Yes, Verbal Permission Granted  Name::     Natalie Thomas  Agency::  [facility]  Relationship::  Legal guardian  Contact Information:  [phone]  Housing/Transportation Living arrangements for the past 2 months:  [place] of Information:  Patient, Medical Team Patient Interpreter Needed:  None Criminal Activity/Legal Involvement Pertinent to Current Situation/Hospitalization:  No - Comment as needed Significant Relationships:  Other Family Members, Siblings, Other(Comment)(Legal guardian) Lives with:  Facility Resident Do you feel safe going back to the place where you live?  Yes Need for family participation in patient care:  Yes (Comment)  Care giving concerns:  Patient is a resident at [facility]. PT recommending SNF once medically stable for discharge.   Social Worker assessment / plan:  CSW met with patient. No supports at bedside. CSW introduced role and explained that PT recommendations would be discussed. Patient confirmed that she is a resident at [facility]. She prefers to return there with HHPT rather than SNF placement but stated she will do what her legal guardian wants her to do. CSW left him a voicemail. No further concerns. CSW encouraged patient and her guardian to contact CSW as needed. CSW will continue to follow patient for support and facilitate discharge to SNF vs. Back ALF once medically stable.  Employment status:  Disabled (Comment on whether or not currently receiving Disability) Insurance information:  Managed Medicare PT Recommendations:  [place] / Referral to community resources:  [place]  Patient/Family's Response to care:  Patient prefers to return to ALF with home health but is agreeable to SNF if that is what her guardian wants her to do. Patient's family supportive and involved in patient's care. Patient appreciated social work intervention.  Patient/Family's Understanding of and Emotional Response to Diagnosis, Current Treatment, and Prognosis:  Patient has a good understanding of the reason for admission and her need for therapy after discharge. Patient appears happy with hospital care.  Emotional Assessment Appearance:  Appears stated age Attitude/Demeanor/Rapport:  Engaged, Gracious Affect (typically observed):  Appropriate, Calm, Pleasant Orientation:  Oriented to Self, Oriented to Place, Oriented to  Time, Oriented to Situation Alcohol / Substance use:  Never Used Psych involvement (Current and /or in the community):  No (Comment)  Discharge Needs  Concerns to be addressed:  Care Coordination Readmission within the last 30 days:  No Current discharge risk:  Dependent with Mobility Barriers to Discharge:  Continued Medical Work up, [place], LCSW 08/02/2017, 2:25 PM

## 2017-08-02 NOTE — Clinical Social Work Note (Signed)
CSW spoke with patient's legal guardian today. He stated she was at [facility] in [place] in [place] for one week before being discharged to [facility]. He is unsure if in the end patient will be agreeable to SNF placement/work with therapies but is agreeable to seeing what her options are. CSW discussed with patient. She is agreeable as well but ultimately prefers to return to ALF.  [place] Scrape, [place]

## 2017-08-03 ENCOUNTER — Inpatient Hospital Stay (HOSPITAL_COMMUNITY): Payer: Medicare Other

## 2017-08-03 ENCOUNTER — Encounter (HOSPITAL_COMMUNITY): Payer: Self-pay | Admitting: Radiology

## 2017-08-03 DIAGNOSIS — R109 Unspecified abdominal pain: Secondary | ICD-10-CM

## 2017-08-03 DIAGNOSIS — R1110 Vomiting, unspecified: Secondary | ICD-10-CM

## 2017-08-03 LAB — BASIC METABOLIC PANEL
Anion gap: 10 (ref 5–15)
BUN: 11 mg/dL (ref 6–20)
CALCIUM: 8.9 mg/dL (ref 8.9–10.3)
CO2: 26 mmol/L (ref 22–32)
CREATININE: 0.54 mg/dL (ref 0.44–1.00)
Chloride: 85 mmol/L — ABNORMAL LOW (ref 101–111)
GFR calc non Af Amer: >60 mL/min (ref >60)
Glucose, Bld: 128 mg/dL — ABNORMAL HIGH (ref 65–99)
Potassium: 4.4 mmol/L (ref 3.5–5.1)
SODIUM: 121 mmol/L — AB (ref 135–145)

## 2017-08-03 LAB — GLUCOSE, CAPILLARY
GLUCOSE-CAPILLARY: 145 mg/dL — AB (ref 65–99)
GLUCOSE-CAPILLARY: 157 mg/dL — AB (ref 65–99)
Glucose-Capillary: 155 mg/dL — ABNORMAL HIGH (ref 65–99)
Glucose-Capillary: 126 mg/dL — ABNORMAL HIGH (ref 65–99)

## 2017-08-03 LAB — CBC
HCT: 37.1 % (ref 36.0–46.0)
Hemoglobin: 12.9 g/dL (ref 12.0–15.0)
MCH: 30 pg (ref 26.0–34.0)
MCHC: 34.8 g/dL (ref 30.0–36.0)
MCV: 86.3 fL (ref 78.0–100.0)
PLATELETS: 267 10*3/uL (ref 150–400)
RBC: 4.3 MIL/uL (ref 3.87–5.11)
RDW: 13.9 % (ref 11.5–15.5)
WBC: 11.6 10*3/uL — ABNORMAL HIGH (ref 4.0–10.5)

## 2017-08-03 MED ORDER — LABETALOL HCL 5 MG/ML IV SOLN
5.0000 mg | INTRAVENOUS | Status: DC | PRN
  Administered 2017-08-03 – 2017-08-07 (×6): 5 mg via INTRAVENOUS
  Filled 2017-08-03 (×7): qty 4

## 2017-08-03 MED ORDER — PROMETHAZINE HCL 25 MG/ML IJ SOLN
12.5000 mg | Freq: Four times a day (QID) | INTRAMUSCULAR | Status: DC | PRN
  Administered 2017-08-03: 12.5 mg via INTRAVENOUS
  Filled 2017-08-03: qty 1

## 2017-08-03 MED ORDER — IOPAMIDOL (ISOVUE-300) INJECTION 61%
INTRAVENOUS | Status: AC
  Administered 2017-08-03: 100 mL
  Filled 2017-08-03: qty 100

## 2017-08-03 MED ORDER — PANTOPRAZOLE SODIUM 40 MG IV SOLR
40.0000 mg | INTRAVENOUS | Status: DC
  Administered 2017-08-03 – 2017-08-07 (×5): 40 mg via INTRAVENOUS
  Filled 2017-08-03 (×5): qty 40

## 2017-08-03 MED ORDER — MORPHINE SULFATE (PF) 2 MG/ML IV SOLN
2.0000 mg | INTRAVENOUS | Status: DC | PRN
  Administered 2017-08-04 – 2017-08-07 (×5): 2 mg via INTRAVENOUS
  Filled 2017-08-03 (×5): qty 1

## 2017-08-03 MED ORDER — FLEET ENEMA 7-19 GM/118ML RE ENEM
1.0000 | ENEMA | Freq: Once | RECTAL | Status: AC
  Administered 2017-08-03: 1 via RECTAL
  Filled 2017-08-03: qty 1

## 2017-08-03 MED ORDER — METOPROLOL TARTRATE 5 MG/5ML IV SOLN
2.5000 mg | Freq: Four times a day (QID) | INTRAVENOUS | Status: DC
  Administered 2017-08-03 – 2017-08-10 (×26): 2.5 mg via INTRAVENOUS
  Filled 2017-08-03 (×27): qty 5

## 2017-08-03 MED ORDER — CALCIUM CARBONATE ANTACID 500 MG PO CHEW
1.0000 | CHEWABLE_TABLET | Freq: Three times a day (TID) | ORAL | Status: DC
  Administered 2017-08-03: 200 mg via ORAL
  Filled 2017-08-03: qty 1

## 2017-08-03 MED ORDER — GI COCKTAIL ~~LOC~~
30.0000 mL | Freq: Three times a day (TID) | ORAL | Status: DC | PRN
  Administered 2017-08-03: 30 mL via ORAL
  Filled 2017-08-03: qty 30

## 2017-08-03 MED ORDER — DIATRIZOATE MEGLUMINE & SODIUM 66-10 % PO SOLN
90.0000 mL | Freq: Once | ORAL | Status: AC
  Administered 2017-08-03: 90 mL via NASOGASTRIC
  Filled 2017-08-03: qty 90

## 2017-08-03 MED ORDER — VALPROATE SODIUM 500 MG/5ML IV SOLN
500.0000 mg | Freq: Two times a day (BID) | INTRAVENOUS | Status: DC
  Administered 2017-08-03 – 2017-08-09 (×12): 500 mg via INTRAVENOUS
  Filled 2017-08-03 (×12): qty 5

## 2017-08-03 MED ORDER — LORAZEPAM 2 MG/ML IJ SOLN
0.5000 mg | INTRAMUSCULAR | Status: DC | PRN
  Administered 2017-08-03 – 2017-08-11 (×20): 0.5 mg via INTRAVENOUS
  Filled 2017-08-03 (×21): qty 1

## 2017-08-03 MED ORDER — IOPAMIDOL (ISOVUE-300) INJECTION 61%
INTRAVENOUS | Status: AC
  Filled 2017-08-03: qty 50

## 2017-08-03 NOTE — Progress Notes (Signed)
Fleet enema given, positive result

## 2017-08-03 NOTE — Progress Notes (Signed)
PROGRESS NOTE    Natalie Thomas  WER:[id number] DOB: August 20, 1960 DOA: 07/30/2017 PCP: Renato Shin, MD   Chief Complaint  Patient presents with  . Shortness of Breath  . Fall  . Constipation    X 1 WEEK    Brief Narrative:  HPI on 07/31/2017 by Dr. Gerlean Ren Natalie Thomas is a 57 y.o. female with medical history significant of COPD, GERD, depression, hypertension came to the ER with complaints of right hip pain after fall.  Patient states that yesterday afternoon while transferring herself from the wheelchair she ended up sustaining a fall on the right side hurting her right hip.  She denies any loss of consciousness or any other warning signs prior to the fall.  She states it was purely mechanical fall as she lost her balance.  Since then she has had right-sided hip pain.  During this time she also states she has noticed her shortness of breath has worsened and has some coughing but unable to bring up any sputum.  Denies any fevers, chills and other complaints.  She has not had a bowel movement in over a week now but thinks this may be due to poor p.o. appetite.  Interim history Admitted for hyponatremia, COPD exacerbation and right hip pain. Patient now complaining of vomiting.  Assessment & Plan   Acute COPD exacerbation -Continue Solu-Medrol, supplemental oxygen as needed, nebulizer treatments -Chest x-ray unremarkable for infection -Patient currently afebrile, no leukocytosis  Hyponatremia -Baseline sodium approximately 130 -Unknown etiology- possibly due to antidepressant medications (patient is on Prozac, wellbutrin, latuda) -Denies alcohol use -Sodium on admission 114, currently up to 121 -Urine electrolytes: Urine sodium 65, urine osmolality 275 -was placed on normal saline and sodium tablets -On 08/02/2017: discussed with nephrology, Dr. Posey Pronto, recommended discontinuing IVF and increasing sodium tablets to 2tabs TID, small dose of IV Lasix  Right-sided hip pain  secondary to mechanical fall/right superior pubic ramus fracture -CT head/cervical spine unremarkable -Right hip x-ray showed no definite acute abnormality -Continue pain control -CT right hip: acute nondisplaced fracture of the right superior pubic ramus -PT/OT consulted-recommended SNF -Discussed with orthopedics, patient does not necessarily need outpatient follow-up.  Allow 6 weeks to heal.  Abdominal pain, vomiting -Continue antiemetics -will obtain CT abd, patient complains of "hernia pain" -GI cocktail added   Constipation -KUB suggest large stool burden -Patient was able to have bowel movement on 08/01/2017 -Continue bowel regimen-continue scheduled MiraLAX, as needed Dulcolax -will obtain CT abd  Hypokalemia/hypomagnesemia -Resolved, continue to monitor and replace as neded  Essential hypertension -Lasix initially held- restart IV form to aid with hyponatremia -Continue clonidine, nifedipine, Coreg, hydralazine  Depression/anxiety -Continue Xanax, Wellbutrin, Latuda, depakote, Paxil -There is some overlap with these medications, patient should follow-up with her psychiatrist or psychologist as an outpatient better medication management  Hyperlipidemia -Continue statin  GERD -Continue PPI  Diabetes mellitus, type II -Currently on no home medications, continue insulin sliding scale and CBG monitoring  Right ear bleeding -Secondary to patient placing her finger in her ear and picking at it. -appears to have improved -hemoglobin 12.9 -Continue to monitor CBC  DVT Prophylaxis  heparin  Code Status: Full  Family Communication: None at bedside  Disposition Plan: Admitted.  SNF when medically stable  Consultants Nephrology, via phone, Dr. Posey Pronto Orthopedic via phone  Procedures  none   Antibiotics   Anti-infectives (From admission, onward)   None      Subjective:   Natalie Thomas seen and examined today.  Continues  to complain of pain, now states it  is "hernia pain." Complains of nausea and vomiting. Feels she cannot pass gas. Feels sick and not well. Denies chest pain.  Objective:   Vitals:   08/03/17 0644 08/03/17 0731 08/03/17 0845 08/03/17 0900  BP: (!) 200/80  (!) 200/80 (!) 180/80  Pulse:      Resp:      Temp:      TempSrc:      SpO2:  98%    Weight:      Height:        Intake/Output Summary (Last 24 hours) at 08/03/2017 1023 Last data filed at 08/03/2017 0959 Gross per 24 hour  Intake 1620 ml  Output 3850 ml  Net -2230 ml   Filed Weights   08/01/17 0529 08/02/17 0423 08/03/17 0547  Weight: 94.2 kg (207 lb 10.8 oz) 95.7 kg (211 lb) 95.7 kg (210 lb 15.7 oz)   Exam  General: Well developed, chronically ill appearing, NAD  HEENT: NCAT, mucous membranes moist.   Cardiovascular: S1 S2 auscultated, RRR, no murmur  Respiratory: Diminished breath sounds, end expiratory wheezing  Abdomen: Soft, nontender, nondistended, + bowel sounds  Extremities: warm dry without cyanosis clubbing or edema  Neuro: AAOx3, nonfocal  Psych: Anxious  Data Reviewed: I have personally reviewed following labs and imaging studies  CBC: Recent Labs  Lab 07/30/17 2105 07/31/17 0205 07/31/17 0345 08/01/17 0605 08/02/17 0451 08/03/17 0451  WBC 12.9*  --  10.0 8.8 10.6* 11.6*  HGB 11.4* 12.2 11.9* 10.9* 10.7* 12.9  HCT 32.6* 36.0 33.4* 32.2* 31.9* 37.1  MCV 83.8  --  83.7 86.1 86.9 86.3  PLT 185  --  196 208 210 193   Basic Metabolic Panel: Recent Labs  Lab 07/31/17 0345  07/31/17 2304 08/01/17 0605 08/01/17 1424 08/02/17 0451 08/03/17 0451  NA  --    < > 120* 120* 120* 120* 121*  K  --    < > 5.3* 5.0 4.9 5.2* 4.4  CL  --    < > 87* 86* 89* 88* 85*  CO2  --    < > 23 23 21* 22 26  GLUCOSE  --    < > 154* 142* 165* 136* 128*  BUN  --    < > 8 7 9 9 11   CREATININE 0.37*   < > 0.49 0.51 0.64 0.55 0.54  CALCIUM  --    < > 8.7* 9.1 8.5* 8.6* 8.9  MG 1.4*  --   --   --  1.9  --   --    < > = values in this interval not  displayed.   GFR: Estimated Creatinine Clearance: 90.9 mL/min (by C-G formula based on SCr of 0.54 mg/dL). Liver Function Tests: Recent Labs  Lab 07/30/17 2105  AST 19  ALT 19  ALKPHOS 73  BILITOT 0.6  PROT 6.5  ALBUMIN 3.1*   No results for input(s): LIPASE, AMYLASE in the last 168 hours. No results for input(s): AMMONIA in the last 168 hours. Coagulation Profile: No results for input(s): INR, PROTIME in the last 168 hours. Cardiac Enzymes: No results for input(s): CKTOTAL, CKMB, CKMBINDEX, TROPONINI in the last 168 hours. BNP (last 3 results) No results for input(s): PROBNP in the last 8760 hours. HbA1C: No results for input(s): HGBA1C in the last 72 hours. CBG: Recent Labs  Lab 08/02/17 0740 08/02/17 1202 08/02/17 1629 08/02/17 2134 08/03/17 0744  GLUCAP 132* 136* 145* 166* 145*   Lipid  Profile: No results for input(s): CHOL, HDL, LDLCALC, TRIG, CHOLHDL, LDLDIRECT in the last 72 hours. Thyroid Function Tests: No results for input(s): TSH, T4TOTAL, FREET4, T3FREE, THYROIDAB in the last 72 hours. Anemia Panel: No results for input(s): VITAMINB12, FOLATE, FERRITIN, TIBC, IRON, RETICCTPCT in the last 72 hours. Urine analysis:    Component Value Date/Time   COLORURINE YELLOW 07/31/2017 0114   APPEARANCEUR CLEAR 07/31/2017 0114   APPEARANCEUR Clear 06/22/2012 0618   LABSPEC 1.008 07/31/2017 0114   LABSPEC 1.003 06/22/2012 0618   PHURINE 7.0 07/31/2017 0114   GLUCOSEU 50 (A) 07/31/2017 0114   GLUCOSEU Negative 06/22/2012 0618   GLUCOSEU NEGATIVE 04/09/2011 1058   HGBUR NEGATIVE 07/31/2017 0114   BILIRUBINUR NEGATIVE 07/31/2017 0114   BILIRUBINUR Negative 06/22/2012 0618   KETONESUR 5 (A) 07/31/2017 0114   PROTEINUR NEGATIVE 07/31/2017 0114   UROBILINOGEN 1.0 08/18/2011 1805   NITRITE NEGATIVE 07/31/2017 0114   LEUKOCYTESUR NEGATIVE 07/31/2017 0114   LEUKOCYTESUR Trace 06/22/2012 0618   Sepsis Labs: @LABRCNTIP (procalcitonin:4,lacticidven:4)  ) Recent  Results (from the past 240 hour(s))  MRSA PCR Screening     Status: None   Collection Time: 07/31/17  4:35 PM  Result Value Ref Range Status   MRSA by PCR NEGATIVE NEGATIVE Final    Comment:        The GeneXpert MRSA Assay (FDA approved for NASAL specimens only), is one component of a comprehensive MRSA colonization surveillance program. It is not intended to diagnose MRSA infection nor to guide or monitor treatment for MRSA infections. Performed at [facility], [place] [address], [place], [place] [address]       Radiology Studies: Ct Hip Right Wo Contrast  Result Date: 08/01/2017 CLINICAL DATA:  Status post fall 2 days ago.  Right hip pain. EXAM: CT OF THE RIGHT HIP WITHOUT CONTRAST TECHNIQUE: Multidetector CT imaging of the right hip was performed according to the standard protocol. Multiplanar CT image reconstructions were also generated. COMPARISON:  None. FINDINGS: Bones/Joint/Cartilage Nondisplaced fracture of the right superior pubic ramus. No other fracture or dislocation. Normal alignment. No joint effusion. Prior right sacroplasty. Severe bilateral facet arthropathy at L5-S1. Ligaments Ligaments are suboptimally evaluated by CT. Muscles and Tendons Muscles are normal.  No muscle atrophy. Soft tissue No fluid collection or hematoma. No soft tissue mass. Soft tissue contusion overlying the right greater trochanter. IMPRESSION: 1. Acute nondisplaced fracture of the right superior pubic ramus. Electronically Signed   By: Kathreen Devoid   On: 08/01/2017 15:03     Scheduled Meds: . ALPRAZolam  0.5 mg Oral TID  . aspirin EC  81 mg Oral Daily  . bisacodyl  10 mg Rectal Once  . buPROPion  150 mg Oral Daily  . calcium carbonate  1 tablet Oral TID WC  . carvedilol  25 mg Oral BID WC  . cloNIDine  0.3 mg Transdermal Weekly  . divalproex  500 mg Oral BID  . docusate sodium  100 mg Oral BID  . ferrous sulfate  325 mg Oral Q breakfast  . fluticasone  2 spray Each Nare BID  .  fluticasone furoate-vilanterol  1 puff Inhalation Daily  . furosemide  20 mg Intravenous Daily  . heparin  5,000 Units Subcutaneous Q8H  . hydrALAZINE  100 mg Oral Q8H  . insulin aspart  0-5 Units Subcutaneous QHS  . insulin aspart  0-9 Units Subcutaneous TID WC  . ipratropium-albuterol  3 mL Nebulization TID  . lurasidone  80 mg Oral Q breakfast  .  Melatonin  9 mg Oral QHS  . methylPREDNISolone (SOLU-MEDROL) injection  40 mg Intravenous Q6H  . multivitamin with minerals   Oral Daily  . NIFEdipine  90 mg Oral Daily  . pantoprazole  40 mg Oral Daily  . PARoxetine  40 mg Oral Daily  . polyethylene glycol  17 g Oral Daily  . pravastatin  20 mg Oral Daily  . sodium chloride  2 g Oral TID WC  . sucralfate  1 g Oral QID   Continuous Infusions:    LOS: 3 days   Time Spent in minutes   45 minutes  Marycarmen Hagey D.O. on 08/03/2017 at 10:23 AM  Between 7am to 7pm - Pager - [phone]  After 7pm go to [email or web address] - password [email or web address]  And look for the night coverage person covering for me after hours  [facility] Office  [phone]

## 2017-08-03 NOTE — Progress Notes (Signed)
Pt is nauseous and in pain throughout the time, given nausea medicine and IV pain medicine throughout the time CT abdomen done, Pt is in NPO this time, NG tube insertion done with another RN witness, it is in low intermittent suction,   BP is lowering down than am, will continue to monitor the patient  Palma Holter, RN

## 2017-08-03 NOTE — Progress Notes (Signed)
SLP Cancellation Note  Patient Details Name: Natalie Thomas MRN: [id number] DOB: [birth year]   Cancelled treatment:       Reason Eval/Treat Not Completed: Medical issues which prohibited therapy. Pt reporting nausea, per RN she vomited several times this morning. Will f/u next date for diet tolerance vs need for instrumental assessment.  Deneise Lever, [place], [place] Speech-Language Pathologist [place] 08/03/2017, 11:21 AM

## 2017-08-03 NOTE — Progress Notes (Signed)
[facility] paged BP 223/84 HR 88 scheduled Hydralazine given rechecked 202/155 bp 10mg  IV Hydralazine given, rechecked 200/80. Arthor Captain LPN

## 2017-08-03 NOTE — Progress Notes (Signed)
Nutrition Brief Note  RD consulted for diet education due to "patients sodium being low".   Per chart review and last MD note, the etiology of hyponatremia is unknown, however, potentially due to antidepressant medications.   Pt does not appear to have severe diarrhea, renal./heart failure, or other areas that could be intervened upon by RD.   No diet education warranted.   Burtis Junes RD, LDN, CNSC Clinical Nutrition Pager: [phone] 08/03/2017 8:35 AM

## 2017-08-03 NOTE — Progress Notes (Signed)
Gastrografin given and right now it is clamped 650 cc emptied so far, X-ray been called

## 2017-08-03 NOTE — Consult Note (Signed)
Reason for Consult: Small bowel obstruction Referring Physician: Conita Amenta is an 57 y.o. female.  HPI: Patient admitted 3 days ago after a fall resulting in a right hip contusion and ramus fracture.  Today she started vomiting profusely, CT demonstrated what was called a high grad obstruction.  Past Medical History:  Diagnosis Date  . CHF (congestive heart failure) ([place])   . COPD (chronic obstructive pulmonary disease) ([place])   . Depression   . Diabetes mellitus, type 2 ([place])    pt denies but states that she has been treated for DM  . GERD (gastroesophageal reflux disease)   . Hypertension   . Osteoporosis     Past Surgical History:  Procedure Laterality Date  . EYE SURGERY     on left  eye, pt states that she sees bad out of the right eye and needs surgery there  . PELVIC FRACTURE SURGERY      No family history on file.  Social History:  reports that she has been smoking cigarettes.  She has been smoking about 0.25 packs per day. she has never used smokeless tobacco. She reports that she does not drink alcohol or use drugs.  Allergies:  Allergies  Allergen Reactions  . Clarithromycin Itching  . Penicillins Itching    Medications: I have reviewed the patient's current medications.  Results for orders placed or performed during the hospital encounter of 07/30/17 (from the past 48 hour(s))  Glucose, capillary     Status: Abnormal   Collection Time: 08/01/17  9:17 PM  Result Value Ref Range   Glucose-Capillary 172 (H) 65 - 99 mg/dL   Comment 1 Notify RN    Comment 2 Document in Chart   CBC     Status: Abnormal   Collection Time: 08/02/17  4:51 AM  Result Value Ref Range   WBC 10.6 (H) 4.0 - 10.5 K/uL   RBC 3.67 (L) 3.87 - 5.11 MIL/uL   Hemoglobin 10.7 (L) 12.0 - 15.0 g/dL   HCT 31.9 (L) 36.0 - 46.0 %   MCV 86.9 78.0 - 100.0 fL   MCH 29.2 26.0 - 34.0 pg   MCHC 33.5 30.0 - 36.0 g/dL   RDW 13.9 11.5 - 15.5 %   Platelets 210 150 - 400 K/uL   Comment: Performed at [facility], [address], [place], [place] [address]  Basic metabolic panel     Status: Abnormal   Collection Time: 08/02/17  4:51 AM  Result Value Ref Range   Sodium 120 (L) 135 - 145 mmol/L   Potassium 5.2 (H) 3.5 - 5.1 mmol/L   Chloride 88 (L) 101 - 111 mmol/L   CO2 22 22 - 32 mmol/L   Glucose, Bld 136 (H) 65 - 99 mg/dL   BUN 9 6 - 20 mg/dL   Creatinine, Ser 0.55 0.44 - 1.00 mg/dL   Calcium 8.6 (L) 8.9 - 10.3 mg/dL   GFR calc non Af Amer >60 >60 mL/min   GFR calc Af Amer >60 >60 mL/min    Comment: (NOTE) The eGFR has been calculated using the CKD EPI equation. This calculation has not been validated in all clinical situations. eGFR's persistently <60 mL/min signify possible Chronic Kidney Disease.    Anion gap 10 5 - 15    Comment: Performed at [facility] [address], [place], [place] [address]  Glucose, capillary     Status: Abnormal   Collection Time: 08/02/17  7:40 AM  Result Value Ref Range   Glucose-Capillary 132 (H) 65 - 99 mg/dL  Glucose, capillary     Status: Abnormal   Collection Time: 08/02/17 12:02 PM  Result Value Ref Range   Glucose-Capillary 136 (H) 65 - 99 mg/dL  Glucose, capillary     Status: Abnormal   Collection Time: 08/02/17  4:29 PM  Result Value Ref Range   Glucose-Capillary 145 (H) 65 - 99 mg/dL  Glucose, capillary     Status: Abnormal   Collection Time: 08/02/17  9:34 PM  Result Value Ref Range   Glucose-Capillary 166 (H) 65 - 99 mg/dL   Comment 1 Notify RN    Comment 2 Document in Chart   CBC     Status: Abnormal   Collection Time: 08/03/17  4:51 AM  Result Value Ref Range   WBC 11.6 (H) 4.0 - 10.5 K/uL   RBC 4.30 3.87 - 5.11 MIL/uL   Hemoglobin 12.9 12.0 - 15.0 g/dL   HCT 37.1 36.0 - 46.0 %   MCV 86.3 78.0 - 100.0 fL   MCH 30.0 26.0 - 34.0 pg   MCHC 34.8 30.0 - 36.0 g/dL   RDW 13.9 11.5 - 15.5 %   Platelets 267 150 - 400 K/uL    Comment: Performed at [facility], [place]. [address]  [place], [place] [address]  Basic metabolic panel     Status: Abnormal   Collection Time: 08/03/17  4:51 AM  Result Value Ref Range   Sodium 121 (L) 135 - 145 mmol/L   Potassium 4.4 3.5 - 5.1 mmol/L   Chloride 85 (L) 101 - 111 mmol/L   CO2 26 22 - 32 mmol/L   Glucose, Bld 128 (H) 65 - 99 mg/dL   BUN 11 6 - 20 mg/dL   Creatinine, Ser 0.54 0.44 - 1.00 mg/dL   Calcium 8.9 8.9 - 10.3 mg/dL   GFR calc non Af Amer >60 >60 mL/min   GFR calc Af Amer >60 >60 mL/min    Comment: (NOTE) The eGFR has been calculated using the CKD EPI equation. This calculation has not been validated in all clinical situations. eGFR's persistently <60 mL/min signify possible Chronic Kidney Disease.    Anion gap 10 5 - 15    Comment: Performed at [facility] [address] [place], [place] [address]  Glucose, capillary     Status: Abnormal   Collection Time: 08/03/17  7:44 AM  Result Value Ref Range   Glucose-Capillary 145 (H) 65 - 99 mg/dL   Comment 1 Notify RN   Glucose, capillary     Status: Abnormal   Collection Time: 08/03/17 11:39 AM  Result Value Ref Range   Glucose-Capillary 155 (H) 65 - 99 mg/dL  Glucose, capillary     Status: Abnormal   Collection Time: 08/03/17  4:22 PM  Result Value Ref Range   Glucose-Capillary 126 (H) 65 - 99 mg/dL    Ct Abdomen Pelvis W Contrast  Result Date: 08/03/2017 CLINICAL DATA:  Acute abdominal and pelvic pain with nausea and vomiting today. EXAM: CT ABDOMEN AND PELVIS WITH CONTRAST TECHNIQUE: Multidetector CT imaging of the abdomen and pelvis was performed using the standard protocol following bolus administration of intravenous contrast. CONTRAST:  194m ISOVUE-300 IOPAMIDOL (ISOVUE-300) INJECTION 61% COMPARISON:  07/25/2017 CT and prior studies FINDINGS: Lower chest: Trace bilateral pleural effusions and minimal bibasilar atelectasis noted. Mild cardiomegaly is present. Hepatobiliary: The liver is unremarkable. The patient is status post cholecystectomy. There is no  evidence of biliary dilatation. Pancreas: Unremarkable Spleen: Unremarkable Adrenals/Urinary Tract: The kidneys, adrenal glands and bladder are unremarkable. Stomach/Bowel: There is a high-grade small bowel obstruction with transition point within the lower right abdomen (image 3 series 55 and series 7 image 87). No obstructing cause is identified and this may be secondary to an adhesion. There is no evidence of pneumoperitoneum or abscess. A small umbilical hernia containing a loop of nondistended small bowel is present but this is not the site of bowel obstruction. Vascular/Lymphatic: Aortic atherosclerosis. No enlarged abdominal or pelvic lymph nodes. Reproductive: Unremarkable Other: A trace amount of free fluid within the pelvis is noted. Diffuse subcutaneous edema is present. Musculoskeletal: No acute or suspicious bony abnormalities noted. Lower thoracic and L2 compression fractures are again noted. IMPRESSION: 1. High-grade small bowel obstruction with transition point within the lower right abdomen. No obstructing cause identified and this may be secondary to an adhesion. 2. Small bilateral pleural effusions, subcutaneous edema and small amount of free pelvic fluid. 3. Small umbilical hernia containing a loop of nondistended small bowel, but not the site of bowel obstruction. 4.  Aortic Atherosclerosis (ICD10-I70.0). Electronically Signed   By: Margarette Canada M.D.   On: 08/03/2017 14:19    Review of Systems  Gastrointestinal: Positive for nausea and vomiting.  All other systems reviewed and are negative.  Blood pressure (!) 168/78, pulse 82, temperature 98 F (36.7 C), temperature source Oral, resp. rate 18, height _0  (1.651 m), weight 95.7 kg (210 lb 15.7 oz), SpO2 98 %. Physical Exam  Vitals reviewed. Constitutional: She is oriented to person, place, and time.  Morbidly obese  HENT:  Head: Normocephalic and atraumatic.  Cardiovascular: Normal rate.  Respiratory: Effort normal and breath  sounds normal.  GI: Soft. She exhibits distension. She exhibits no shifting dullness. Bowel sounds are decreased. There is generalized tenderness (mild). There is no rigidity, no rebound and no guarding. A hernia (soft, reducible, nontender umbilical hernia) is present.  Neurological: She is alert and oriented to person, place, and time. She has normal reflexes.  Skin: Skin is warm and dry.  Psychiatric: She has a normal mood and affect. Her behavior is normal. Judgment and thought content normal.    Assessment/Plan: Partial SBO Possible severe constipation  SBO protocol with contrast Fleet enema  Judeth Horn 08/03/2017, 4:51 PM

## 2017-08-04 ENCOUNTER — Inpatient Hospital Stay (HOSPITAL_COMMUNITY): Payer: Medicare Other

## 2017-08-04 LAB — GLUCOSE, CAPILLARY
GLUCOSE-CAPILLARY: 136 mg/dL — AB (ref 65–99)
GLUCOSE-CAPILLARY: 129 mg/dL — AB (ref 65–99)
Glucose-Capillary: 147 mg/dL — ABNORMAL HIGH (ref 65–99)
Glucose-Capillary: 129 mg/dL — ABNORMAL HIGH (ref 65–99)
Glucose-Capillary: 149 mg/dL — ABNORMAL HIGH (ref 65–99)

## 2017-08-04 LAB — CBC
HCT: 38.2 % (ref 36.0–46.0)
HEMOGLOBIN: 12.9 g/dL (ref 12.0–15.0)
MCH: 29.7 pg (ref 26.0–34.0)
MCHC: 33.8 g/dL (ref 30.0–36.0)
MCV: 87.8 fL (ref 78.0–100.0)
PLATELETS: 274 10*3/uL (ref 150–400)
RBC: 4.35 MIL/uL (ref 3.87–5.11)
RDW: 13.9 % (ref 11.5–15.5)
WBC: 14.9 10*3/uL — AB (ref 4.0–10.5)

## 2017-08-04 LAB — COMPREHENSIVE METABOLIC PANEL
ALBUMIN: 3 g/dL — AB (ref 3.5–5.0)
ALK PHOS: 58 U/L (ref 38–126)
ALT: 23 U/L (ref 14–54)
AST: 16 U/L (ref 15–41)
Anion gap: 13 (ref 5–15)
BUN: 17 mg/dL (ref 6–20)
CALCIUM: 8.7 mg/dL — AB (ref 8.9–10.3)
CHLORIDE: 81 mmol/L — AB (ref 101–111)
CO2: 28 mmol/L (ref 22–32)
CREATININE: 0.57 mg/dL (ref 0.44–1.00)
GFR calc Af Amer: >60 mL/min (ref >60)
GFR calc non Af Amer: >60 mL/min (ref >60)
GLUCOSE: 140 mg/dL — AB (ref 65–99)
Potassium: 4.3 mmol/L (ref 3.5–5.1)
SODIUM: 122 mmol/L — AB (ref 135–145)
Total Bilirubin: 0.5 mg/dL (ref 0.3–1.2)
Total Protein: 6 g/dL — ABNORMAL LOW (ref 6.5–8.1)

## 2017-08-04 MED ORDER — LABETALOL HCL 5 MG/ML IV SOLN
5.0000 mg | Freq: Once | INTRAVENOUS | Status: AC
  Administered 2017-08-04: 5 mg via INTRAVENOUS

## 2017-08-04 NOTE — Progress Notes (Signed)
Blood pressures are responding to IV Labetalol 5mg .

## 2017-08-04 NOTE — Progress Notes (Signed)
PROGRESS NOTE    Natalie Thomas  WRU:[id number] DOB: [birth year] DOA: 07/30/2017 PCP: Renato Shin, MD   Chief Complaint  Patient presents with  . Shortness of Breath  . Fall  . Constipation    X 1 WEEK    Brief Narrative:  HPI on 07/31/2017 by Dr. Gerlean Ren Natalie Thomas is a 57 y.o. female with medical history significant of COPD, GERD, depression, hypertension came to the ER with complaints of right hip pain after fall.  Patient states that yesterday afternoon while transferring herself from the wheelchair she ended up sustaining a fall on the right side hurting her right hip.  She denies any loss of consciousness or any other warning signs prior to the fall.  She states it was purely mechanical fall as she lost her balance.  Since then she has had right-sided hip pain.  During this time she also states she has noticed her shortness of breath has worsened and has some coughing but unable to bring up any sputum.  Denies any fevers, chills and other complaints.  She has not had a bowel movement in over a week now but thinks this may be due to poor p.o. appetite.  Interim history Admitted for hyponatremia, COPD exacerbation and right hip pain. Patient now complaining of vomiting. Found to have SBO, general surgery consulted. Assessment & Plan   Acute small bowel obstruction with abdominal pain and vomiting -CT abdomen pelvis showed high-grade small bowel obstruction with transition point within the lower right abdomen.  No obstructing cause identified and this may be secondary to adhesion. -Patient made n.p.o. -NG tube placed -General surgery consulted and appreciated -Continue to monitor closely  Leukocytosis -Likely multifactorial including small bowel obstruction, steroids -Continue to monitor  Acute COPD exacerbation -Continue Solu-Medrol, supplemental oxygen as needed, nebulizer treatments -Chest x-ray unremarkable for infection -Patient currently afebrile, no  leukocytosis -Will wean Solu-Medrol  Hyponatremia -Baseline sodium approximately 130 -Unknown etiology- possibly due to antidepressant medications (patient is on Prozac, wellbutrin, latuda) -Denies alcohol use -Sodium on admission 114, currently up to 122 -Urine electrolytes: Urine sodium 65, urine osmolality 275 -was placed on normal saline and sodium tablets -On 08/02/2017: discussed with nephrology, Dr. Posey Pronto, recommended discontinuing IVF and increasing sodium tablets to 2tabs TID, small dose of IV Lasix -Patient currently n.p.o., will have to hold sodium tablets - we will continue IV Lasix and monitor BMP  Right-sided hip pain secondary to mechanical fall/right superior pubic ramus fracture -CT head/cervical spine unremarkable -Right hip x-ray showed no definite acute abnormality -Continue pain control -CT right hip: acute nondisplaced fracture of the right superior pubic ramus -PT/OT consulted-recommended SNF -Discussed with orthopedics, patient does not necessarily need outpatient follow-up.  Allow 6 weeks to heal.  Constipation -KUB suggest large stool burden -Patient was able to have bowel movement on 08/01/2017 -Continue bowel regimen-continue scheduled MiraLAX, as needed Dulcolax -Patient was given Fleet enema by general surgery  Hypokalemia/hypomagnesemia -Resolved, continue to monitor and replace as neded  Essential hypertension -continue IV Lasix, clonidine patch -Nifedipine and Coreg held due to n.p.o. Status -placed on IV metoprolol 2.5 mg IV scheduled along with IV hydralazine and labetalol as needed  Depression/anxiety -Continue Xanax, Wellbutrin, Latuda, depakote, Paxil -There is some overlap with these medications, patient should follow-up with her psychiatrist or psychologist as an outpatient better medication management  Hyperlipidemia -Continue statin  GERD -Continue PPI- Changed to IV form  Diabetes mellitus, type II -Currently on no home  medications, continue insulin sliding scale  and CBG monitoring  Right ear bleeding -Secondary to patient placing her finger in her ear and picking at it. -appears to have improved -hemoglobin 12.9 -Continue to monitor CBC  DVT Prophylaxis  heparin  Code Status: Full  Family Communication: None at bedside  Disposition Plan: Admitted.  SNF when medically stable  Consultants Nephrology, via phone, Dr. Posey Pronto Orthopedic via phone General surgery  Procedures  NG tube placement  Antibiotics   Anti-infectives (From admission, onward)   None      Subjective:   [name] seen and examined today.  Patient states she is feeling mildly better since the NG tube was placed.  Currently denies any pain.  Denies chest pain, feels breathing is mildly improved.  Denies dizziness or headache.  Objective:   Vitals:   08/04/17 0628 08/04/17 0800 08/04/17 0836 08/04/17 0917  BP: (!) 166/69 (!) 148/58 (!) 150/59 (!) 179/70  Pulse:   66   Resp:   18   Temp:   98 F (36.7 C)   TempSrc:   Oral   SpO2: 97% 96% 96%   Weight:      Height:        Intake/Output Summary (Last 24 hours) at 08/04/2017 1031 Last data filed at 08/04/2017 0300 Gross per 24 hour  Intake 900 ml  Output 1050 ml  Net -150 ml   Filed Weights   08/02/17 0423 08/03/17 0547 08/04/17 0454  Weight: 95.7 kg (211 lb) 95.7 kg (210 lb 15.7 oz) 93.1 kg (205 lb 4 oz)   Exam  General: Well developed, chronically ill-appearing, no apparent distress  HEENT: NCAT, NG tube in place, mucous membranes moist.   Cardiovascular: S1 S2 auscultated, RRR, no murmur  Respiratory: Diminished breath sounds, with expiratory wheezing  Abdomen: Soft, obese, mildly TTP in epigastric area, nontender, nondistended  Extremities: warm dry without cyanosis clubbing or edema  Neuro: AAOx3, nonfocal  Psych: appropriate  Data Reviewed: I have personally reviewed following labs and imaging studies  CBC: Recent Labs  Lab  07/31/17 0345 08/01/17 0605 08/02/17 0451 08/03/17 0451 08/04/17 0735  WBC 10.0 8.8 10.6* 11.6* 14.9*  HGB 11.9* 10.9* 10.7* 12.9 12.9  HCT 33.4* 32.2* 31.9* 37.1 38.2  MCV 83.7 86.1 86.9 86.3 87.8  PLT 196 208 210 267 852   Basic Metabolic Panel: Recent Labs  Lab 07/31/17 0345  08/01/17 0605 08/01/17 1424 08/02/17 0451 08/03/17 0451 08/04/17 0735  NA  --    < > 120* 120* 120* 121* 122*  K  --    < > 5.0 4.9 5.2* 4.4 4.3  CL  --    < > 86* 89* 88* 85* 81*  CO2  --    < > 23 21* 22 26 28   GLUCOSE  --    < > 142* 165* 136* 128* 140*  BUN  --    < > 7 9 9 11 17   CREATININE 0.37*   < > 0.51 0.64 0.55 0.54 0.57  CALCIUM  --    < > 9.1 8.5* 8.6* 8.9 8.7*  MG 1.4*  --   --  1.9  --   --   --    < > = values in this interval not displayed.   GFR: Estimated Creatinine Clearance: 89.6 mL/min (by C-G formula based on SCr of 0.57 mg/dL). Liver Function Tests: Recent Labs  Lab 07/30/17 2105 08/04/17 0735  AST 19 16  ALT 19 23  ALKPHOS 73 58  BILITOT 0.6 0.5  PROT 6.5 6.0*  ALBUMIN 3.1* 3.0*   No results for input(s): LIPASE, AMYLASE in the last 168 hours. No results for input(s): AMMONIA in the last 168 hours. Coagulation Profile: No results for input(s): INR, PROTIME in the last 168 hours. Cardiac Enzymes: No results for input(s): CKTOTAL, CKMB, CKMBINDEX, TROPONINI in the last 168 hours. BNP (last 3 results) No results for input(s): PROBNP in the last 8760 hours. HbA1C: No results for input(s): HGBA1C in the last 72 hours. CBG: Recent Labs  Lab 08/03/17 1139 08/03/17 1622 08/03/17 2214 08/04/17 0621 08/04/17 0725  GLUCAP 155* 126* 157* 147* 129*   Lipid Profile: No results for input(s): CHOL, HDL, LDLCALC, TRIG, CHOLHDL, LDLDIRECT in the last 72 hours. Thyroid Function Tests: No results for input(s): TSH, T4TOTAL, FREET4, T3FREE, THYROIDAB in the last 72 hours. Anemia Panel: No results for input(s): VITAMINB12, FOLATE, FERRITIN, TIBC, IRON, RETICCTPCT in  the last 72 hours. Urine analysis:    Component Value Date/Time   COLORURINE YELLOW 07/31/2017 0114   APPEARANCEUR CLEAR 07/31/2017 0114   APPEARANCEUR Clear 06/22/2012 0618   LABSPEC 1.008 07/31/2017 0114   LABSPEC 1.003 06/22/2012 0618   PHURINE 7.0 07/31/2017 0114   GLUCOSEU 50 (A) 07/31/2017 0114   GLUCOSEU Negative 06/22/2012 0618   GLUCOSEU NEGATIVE 04/09/2011 1058   HGBUR NEGATIVE 07/31/2017 0114   BILIRUBINUR NEGATIVE 07/31/2017 0114   BILIRUBINUR Negative 06/22/2012 0618   KETONESUR 5 (A) 07/31/2017 0114   PROTEINUR NEGATIVE 07/31/2017 0114   UROBILINOGEN 1.0 08/18/2011 1805   NITRITE NEGATIVE 07/31/2017 0114   LEUKOCYTESUR NEGATIVE 07/31/2017 0114   LEUKOCYTESUR Trace 06/22/2012 0618   Sepsis Labs: @LABRCNTIP (procalcitonin:4,lacticidven:4)  ) Recent Results (from the past 240 hour(s))  MRSA PCR Screening     Status: None   Collection Time: 07/31/17  4:35 PM  Result Value Ref Range Status   MRSA by PCR NEGATIVE NEGATIVE Final    Comment:        The GeneXpert MRSA Assay (FDA approved for NASAL specimens only), is one component of a comprehensive MRSA colonization surveillance program. It is not intended to diagnose MRSA infection nor to guide or monitor treatment for MRSA infections. Performed at [facility], [place] [address], [place], [place] [address]       Radiology Studies: Ct Abdomen Pelvis W Contrast  Result Date: 08/03/2017 CLINICAL DATA:  Acute abdominal and pelvic pain with nausea and vomiting today. EXAM: CT ABDOMEN AND PELVIS WITH CONTRAST TECHNIQUE: Multidetector CT imaging of the abdomen and pelvis was performed using the standard protocol following bolus administration of intravenous contrast. CONTRAST:  170mL ISOVUE-300 IOPAMIDOL (ISOVUE-300) INJECTION 61% COMPARISON:  07/25/2017 CT and prior studies FINDINGS: Lower chest: Trace bilateral pleural effusions and minimal bibasilar atelectasis noted. Mild cardiomegaly is present. Hepatobiliary:  The liver is unremarkable. The patient is status post cholecystectomy. There is no evidence of biliary dilatation. Pancreas: Unremarkable Spleen: Unremarkable Adrenals/Urinary Tract: The kidneys, adrenal glands and bladder are unremarkable. Stomach/Bowel: There is a high-grade small bowel obstruction with transition point within the lower right abdomen (image 3 series 55 and series 7 image 87). No obstructing cause is identified and this may be secondary to an adhesion. There is no evidence of pneumoperitoneum or abscess. A small umbilical hernia containing a loop of nondistended small bowel is present but this is not the site of bowel obstruction. Vascular/Lymphatic: Aortic atherosclerosis. No enlarged abdominal or pelvic lymph nodes. Reproductive: Unremarkable Other: A trace amount of free fluid within the pelvis is noted. Diffuse subcutaneous  edema is present. Musculoskeletal: No acute or suspicious bony abnormalities noted. Lower thoracic and L2 compression fractures are again noted. IMPRESSION: 1. High-grade small bowel obstruction with transition point within the lower right abdomen. No obstructing cause identified and this may be secondary to an adhesion. 2. Small bilateral pleural effusions, subcutaneous edema and small amount of free pelvic fluid. 3. Small umbilical hernia containing a loop of nondistended small bowel, but not the site of bowel obstruction. 4.  Aortic Atherosclerosis (ICD10-I70.0). Electronically Signed   By: Margarette Canada M.D.   On: 08/03/2017 14:19   Dg Abd Portable 1v-small Bowel Obstruction Protocol-initial, 8 Hr Delay  Result Date: 08/04/2017 CLINICAL DATA:  Small bowel obstruction 8 hour delay EXAM: PORTABLE ABDOMEN - 1 VIEW COMPARISON:  08/03/2017, CT 08/03/2017 FINDINGS: Esophageal tube tip projects over the proximal stomach, side-port is in the region of GE junction. Retained contrast in the gastric fundus. Surgical clips in the right upper quadrant. Residual contrast in the  urinary bladder. Persistent gaseous enlargement of small bowel loops measuring up to 4.6 cm, consistent with bowel obstruction. No significant contrast within the colon. IMPRESSION: 1. Persistent dilated loops of small bowel, consistent with bowel obstruction. 2. Retained contrast within the stomach. Residual contrast noted in the bladder. Electronically Signed   By: Donavan Foil M.D.   On: 08/04/2017 02:56   Dg Abd Portable 1v  Result Date: 08/03/2017 CLINICAL DATA:  NG tube placement. EXAM: PORTABLE ABDOMEN - 1 VIEW COMPARISON:  CT, 08/03/2017 at 12:56 p.m. FINDINGS: Nasogastric tube passes below the diaphragm. Tip lies in the mid stomach. Dilated loops of small bowel are noted consistent with a partial obstruction as noted on the current CT. IMPRESSION: 1. Well-positioned nasogastric tube. Electronically Signed   By: Lajean Manes M.D.   On: 08/03/2017 17:12     Scheduled Meds: . aspirin EC  81 mg Oral Daily  . bisacodyl  10 mg Rectal Once  . buPROPion  150 mg Oral Daily  . cloNIDine  0.3 mg Transdermal Weekly  . ferrous sulfate  325 mg Oral Q breakfast  . fluticasone  2 spray Each Nare BID  . fluticasone furoate-vilanterol  1 puff Inhalation Daily  . furosemide  20 mg Intravenous Daily  . heparin  5,000 Units Subcutaneous Q8H  . insulin aspart  0-5 Units Subcutaneous QHS  . insulin aspart  0-9 Units Subcutaneous TID WC  . ipratropium-albuterol  3 mL Nebulization TID  . lurasidone  80 mg Oral Q breakfast  . methylPREDNISolone (SOLU-MEDROL) injection  40 mg Intravenous Q6H  . metoprolol tartrate  2.5 mg Intravenous Q6H  . pantoprazole (PROTONIX) IV  40 mg Intravenous Q24H  . PARoxetine  40 mg Oral Daily  . pravastatin  20 mg Oral Daily  . sucralfate  1 g Oral QID   Continuous Infusions: . valproate sodium 500 mg (08/04/17 0924)     LOS: 4 days   Time Spent in minutes   45 minutes  Ashna Dorough D.O. on 08/04/2017 at 10:31 AM  Between 7am to 7pm - Pager -  [phone]  After 7pm go to [email or web address] - password [email or web address]  And look for the night coverage person covering for me after hours  [facility] Office  [phone]

## 2017-08-04 NOTE — Progress Notes (Signed)
Subjective/Chief Complaint: No acute events. Feels better with NG. Denies pain.    Objective: Vital signs in last 24 hours: Temp:  [98 F (36.7 C)-98.5 F (36.9 C)] 98.5 F (36.9 C) (02/10 0454) Pulse Rate:  [69-82] 69 (02/10 0454) Resp:  [18] 18 (02/09 1320) BP: (164-217)/(58-108) 166/69 (02/10 0628) SpO2:  [93 %-99 %] 97 % (02/10 0628) Weight:  [93.1 kg (205 lb 4 oz)] 93.1 kg (205 lb 4 oz) (02/10 0454) Last BM Date: 08/02/17  Intake/Output from previous day: 02/09 0701 - 02/10 0700 In: 1020 [P.O.:120; NG/GT:900] Out: 1050 [Urine:400; Emesis/NG output:650] Intake/Output this shift: No intake/output data recorded.  General appearance: alert and cooperative Head: Normocephalic, without obvious abnormality, edentulous Resp: wheezes bilaterally Cardio: regular rate and rhythm GI: obese, soft, mildly tender in epigastrium. umbilical hernia noted on Ct is not palpable but not tender in this area Extremities: extremities normal, atraumatic, no cyanosis or edema Skin: Skin color, texture, turgor normal. No rashes or lesions Neurologic: Grossly normal  Lab Results:  Recent Labs    08/02/17 0451 08/03/17 0451  WBC 10.6* 11.6*  HGB 10.7* 12.9  HCT 31.9* 37.1  PLT 210 267   BMET Recent Labs    08/02/17 0451 08/03/17 0451  NA 120* 121*  K 5.2* 4.4  CL 88* 85*  CO2 22 26  GLUCOSE 136* 128*  BUN 9 11  CREATININE 0.55 0.54  CALCIUM 8.6* 8.9   PT/INR No results for input(s): LABPROT, INR in the last 72 hours. ABG No results for input(s): PHART, HCO3 in the last 72 hours.  Invalid input(s): PCO2, PO2  Studies/Results: Ct Abdomen Pelvis W Contrast  Result Date: 08/03/2017 CLINICAL DATA:  Acute abdominal and pelvic pain with nausea and vomiting today. EXAM: CT ABDOMEN AND PELVIS WITH CONTRAST TECHNIQUE: Multidetector CT imaging of the abdomen and pelvis was performed using the standard protocol following bolus administration of intravenous contrast. CONTRAST:   131mL ISOVUE-300 IOPAMIDOL (ISOVUE-300) INJECTION 61% COMPARISON:  07/25/2017 CT and prior studies FINDINGS: Lower chest: Trace bilateral pleural effusions and minimal bibasilar atelectasis noted. Mild cardiomegaly is present. Hepatobiliary: The liver is unremarkable. The patient is status post cholecystectomy. There is no evidence of biliary dilatation. Pancreas: Unremarkable Spleen: Unremarkable Adrenals/Urinary Tract: The kidneys, adrenal glands and bladder are unremarkable. Stomach/Bowel: There is a high-grade small bowel obstruction with transition point within the lower right abdomen (image 3 series 55 and series 7 image 87). No obstructing cause is identified and this may be secondary to an adhesion. There is no evidence of pneumoperitoneum or abscess. A small umbilical hernia containing a loop of nondistended small bowel is present but this is not the site of bowel obstruction. Vascular/Lymphatic: Aortic atherosclerosis. No enlarged abdominal or pelvic lymph nodes. Reproductive: Unremarkable Other: A trace amount of free fluid within the pelvis is noted. Diffuse subcutaneous edema is present. Musculoskeletal: No acute or suspicious bony abnormalities noted. Lower thoracic and L2 compression fractures are again noted. IMPRESSION: 1. High-grade small bowel obstruction with transition point within the lower right abdomen. No obstructing cause identified and this may be secondary to an adhesion. 2. Small bilateral pleural effusions, subcutaneous edema and small amount of free pelvic fluid. 3. Small umbilical hernia containing a loop of nondistended small bowel, but not the site of bowel obstruction. 4.  Aortic Atherosclerosis (ICD10-I70.0). Electronically Signed   By: Margarette Canada M.D.   On: 08/03/2017 14:19   Dg Abd Portable 1v-small Bowel Obstruction Protocol-initial, 8 Hr Delay  Result Date: 08/04/2017 CLINICAL  DATA:  Small bowel obstruction 8 hour delay EXAM: PORTABLE ABDOMEN - 1 VIEW COMPARISON:   08/03/2017, CT 08/03/2017 FINDINGS: Esophageal tube tip projects over the proximal stomach, side-port is in the region of GE junction. Retained contrast in the gastric fundus. Surgical clips in the right upper quadrant. Residual contrast in the urinary bladder. Persistent gaseous enlargement of small bowel loops measuring up to 4.6 cm, consistent with bowel obstruction. No significant contrast within the colon. IMPRESSION: 1. Persistent dilated loops of small bowel, consistent with bowel obstruction. 2. Retained contrast within the stomach. Residual contrast noted in the bladder. Electronically Signed   By: Donavan Foil M.D.   On: 08/04/2017 02:56   Dg Abd Portable 1v  Result Date: 08/03/2017 CLINICAL DATA:  NG tube placement. EXAM: PORTABLE ABDOMEN - 1 VIEW COMPARISON:  CT, 08/03/2017 at 12:56 p.m. FINDINGS: Nasogastric tube passes below the diaphragm. Tip lies in the mid stomach. Dilated loops of small bowel are noted consistent with a partial obstruction as noted on the current CT. IMPRESSION: 1. Well-positioned nasogastric tube. Electronically Signed   By: Lajean Manes M.D.   On: 08/03/2017 17:12    Anti-infectives: Anti-infectives (From admission, onward)   None      Assessment/Plan: s/p * No surgery found * Continue NG decompression, NPO. Avoid narcotics. Sb protocol- contrast still in stomach on 8hr delay. Will follow 24hr delay films.   LOS: 4 days    Natalie Thomas 08/04/2017

## 2017-08-04 NOTE — Progress Notes (Signed)
Paged night time md in regards to b/p give 5mg  labetalol.

## 2017-08-04 NOTE — Progress Notes (Signed)
Pt has been off b.p med and psych meds was given prns for blood pressure and moaning pain.

## 2017-08-04 NOTE — Progress Notes (Signed)
Pt is drowsy and sleepy but easily arousal, BP maintained most of the time, NG tube suction continue, pt is on oxygen @3l /min, incontinent, turning and positioning maintained, will continue to monitor the patient  Palma Holter, RN

## 2017-08-04 NOTE — Progress Notes (Signed)
Oral medicines are in hold, NG tube suction continue

## 2017-08-04 NOTE — NC FL2 (Addendum)
[place] MEDICAID FL2 LEVEL OF CARE SCREENING TOOL     IDENTIFICATION  Patient Name: Natalie Thomas Birthdate: [birth year] Sex: female Admission Date (Current Location): 07/30/2017  [facility] and [place] Number:  Herbalist and Address:  [facility] [place], [place] [address] [place], [place] [address]      Provider Number: [id number]  Attending Physician Name and Address:  Cristal Ford, DO  Relative Name and Phone Number:       Current Level of Care: Hospital Recommended Level of Care: [place] Prior Approval Number:    Date Approved/Denied:   PASRR Number: [id number] F Expires 09/11/17  Discharge Plan: SNF    Current Diagnoses: Patient Active Problem List   Diagnosis Date Noted  . Incontinence of urine 08/14/2011  . Hyponatremia 07/27/2011  . Schizoaffective disorder, bipolar type ([place]) 07/05/2011  . Cough 05/08/2011  . Encounter for long-term (current) use of other medications 04/09/2011  . Lumbar disc disease 04/09/2011  . Polycythemia secondary to smoking 04/09/2011  . HIP PAIN, LEFT 12/12/2007  . 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 DIABETES MELLITUS, TYPE II 04/08/2007  . MENOPAUSE, PREMATURE 04/08/2007  . HYPONATREMIA 04/08/2007  . SCHIZOAFFECTIVE DISORDER 04/08/2007  . DEPRESSION 04/08/2007  . HYPERTENSION 04/08/2007  . COPD 04/08/2007  . GERD 04/08/2007  . OSTEOPOROSIS 04/08/2007  . DYSURIA 04/08/2007    Orientation RESPIRATION BLADDER Height & Weight     Self, Time, Situation, Place  O2(2.5L [place]) Continent Weight: 205 lb 4 oz (93.1 kg) Height:  5\' 5"  (165.1 cm)  BEHAVIORAL SYMPTOMS/MOOD NEUROLOGICAL BOWEL NUTRITION STATUS      Continent Diet(see DC summary)  AMBULATORY STATUS COMMUNICATION OF NEEDS Skin   Limited Assist Verbally Normal                       Personal Care Assistance Level of Assistance  Bathing, Dressing Bathing Assistance: Limited assistance   Dressing Assistance:  Limited assistance     Functional Limitations Info             SPECIAL CARE FACTORS FREQUENCY  PT (By licensed PT), OT (By licensed OT)     PT Frequency: 5/wk OT Frequency: 5/wk            Contractures      Additional Factors Info  Code Status, Allergies, Psychotropic, Insulin Sliding Scale Code Status Info: FULL Allergies Info: Clarithromycin, Penicillins Psychotropic Info: wellbutrin Insulin Sliding Scale Info: 4/day       Current Medications (08/04/2017):  This is the current hospital active medication list Current Facility-Administered Medications  Medication Dose Route Frequency Provider Last Rate Last Dose  . antiseptic oral rinse (BIOTENE) solution 2 mL  2 mL Mouth Rinse PRN Amin, Ankit Chirag, MD      . aspirin EC tablet 81 mg  81 mg Oral Daily Damita Lack, MD   Stopped at 08/04/17 0920  . bisacodyl (DULCOLAX) suppository 10 mg  10 mg Rectal Once Cristal Ford, DO      . buPROPion (WELLBUTRIN XL) 24 hr tablet 150 mg  150 mg Oral Daily Damita Lack, MD   Stopped at 08/04/17 [phone]  . cloNIDine (CATAPRES - Dosed in mg/24 hr) patch 0.3 mg  0.3 mg Transdermal Weekly Amin, Ankit Chirag, MD   0.3 mg at 08/02/17 0820  . ferrous sulfate tablet 325 mg  325 mg Oral Q breakfast Amin, Ankit Chirag, MD   325 mg at 08/02/17 0811  . fluticasone (FLONASE)  50 MCG/ACT nasal spray 2 spray  2 spray Each Nare BID Damita Lack, MD   2 spray at 08/02/17 2147  . fluticasone furoate-vilanterol (BREO ELLIPTA) 100-25 MCG/INH 1 puff  1 puff Inhalation Daily Amin, Ankit Chirag, MD   1 puff at 08/04/17 0922  . furosemide (LASIX) injection 20 mg  20 mg Intravenous Daily Cristal Ford, DO   20 mg at 08/04/17 3474  . heparin injection 5,000 Units  5,000 Units Subcutaneous Q8H Amin, Ankit Chirag, MD   5,000 Units at 08/04/17 2595  . hydrALAZINE (APRESOLINE) injection 10 mg  10 mg Intravenous Q4H PRN Damita Lack, MD   10 mg at 08/04/17 0309  . insulin aspart (novoLOG)  injection 0-5 Units  0-5 Units Subcutaneous QHS Amin, Ankit Chirag, MD      . insulin aspart (novoLOG) injection 0-9 Units  0-9 Units Subcutaneous TID WC Amin, Jeanella Flattery, MD   1 Units at 08/04/17 0823  . ipratropium-albuterol (DUONEB) 0.5-2.5 (3) MG/3ML nebulizer solution 3 mL  3 mL Nebulization Q2H PRN Amin, Jeanella Flattery, MD   3 mL at 08/01/17 0626  . ipratropium-albuterol (DUONEB) 0.5-2.5 (3) MG/3ML nebulizer solution 3 mL  3 mL Nebulization TID Cristal Ford, DO   3 mL at 08/04/17 0920  . ketorolac (TORADOL) 15 MG/ML injection 15 mg  15 mg Intravenous Q6H PRN Amin, Ankit Chirag, MD   15 mg at 08/03/17 1518  . labetalol (NORMODYNE,TRANDATE) injection 5 mg  5 mg Intravenous Q2H PRN Cristal Ford, DO   5 mg at 08/04/17 6387  . LORazepam (ATIVAN) injection 0.5 mg  0.5 mg Intravenous Q4H PRN Cristal Ford, DO   0.5 mg at 08/03/17 1734  . lurasidone (LATUDA) tablet 80 mg  80 mg Oral Q breakfast Amin, Ankit Chirag, MD   80 mg at 08/03/17 0835  . methylPREDNISolone sodium succinate (SOLU-MEDROL) 40 mg/mL injection 40 mg  40 mg Intravenous Q6H Amin, Ankit Chirag, MD   40 mg at 08/04/17 0924  . metoprolol tartrate (LOPRESSOR) injection 2.5 mg  2.5 mg Intravenous Q6H Mikhail, [place], DO   2.5 mg at 08/04/17 5643  . morphine 2 MG/ML injection 2 mg  2 mg Intravenous Q4H PRN Cristal Ford, DO   2 mg at 08/04/17 0345  . ondansetron (ZOFRAN) injection 4 mg  4 mg Intravenous Q6H PRN Amin, Jeanella Flattery, MD   4 mg at 08/03/17 1211  . pantoprazole (PROTONIX) injection 40 mg  40 mg Intravenous Q24H Mikhail, [place], DO   40 mg at 08/03/17 1736  . PARoxetine (PAXIL) tablet 40 mg  40 mg Oral Daily Damita Lack, MD   Stopped at 08/04/17 [phone]  . pravastatin (PRAVACHOL) tablet 20 mg  20 mg Oral Daily Damita Lack, MD   Stopped at 08/04/17 [phone]  . promethazine (PHENERGAN) injection 12.5 mg  12.5 mg Intravenous Q6H PRN Cristal Ford, DO   12.5 mg at 08/03/17 1509  . sodium chloride (OCEAN) 0.65  % nasal spray 1 spray  1 spray Each Nare PRN Cristal Ford, DO   1 spray at 08/03/17 [phone]  . sucralfate (CARAFATE) tablet 1 g  1 g Oral QID Amin, Jeanella Flattery, MD   Stopped at 08/03/17 1737  . valproate (DEPACON) 500 mg in dextrose 5 % 50 mL IVPB  500 mg Intravenous Q12H Cristal Ford, DO 55 mL/hr at 08/04/17 0924 500 mg at 08/04/17 6301     Discharge Medications: Please see discharge summary for a list of discharge medications.  Relevant  Imaging Results:  Relevant Lab Results:   Additional Information SS#: [id number]  Jorge Ny, LCSW

## 2017-08-04 NOTE — Progress Notes (Signed)
Pt is alseep, blood pressure elevated 200/100 gave scheduled metoprolol. Blood pressure up unchanged. Changed the canister from N/G tube low wall suction.

## 2017-08-04 NOTE — Progress Notes (Signed)
Pt is drowsy and sleepy this am but still alert and oriented and Latest BP 148/58, suction working good, has a good output, will continue to monitor the patient  Natalie Thomas, Therapist, sports

## 2017-08-04 NOTE — Progress Notes (Signed)
SLP Cancellation Note  Patient Details Name: Natalie Thomas MRN: [id number] DOB: 1961/03/28   Cancelled treatment:       Reason Eval/Treat Not Completed: Medical issues which prohibited therapy. Pt NPO after surgery, has NG; spoke with RN, will f/u next date.  Deneise Lever, [place], [place] Speech-Language Pathologist [place] 08/04/2017, 10:17 AM

## 2017-08-05 ENCOUNTER — Inpatient Hospital Stay (HOSPITAL_COMMUNITY): Payer: Medicare Other

## 2017-08-05 LAB — GLUCOSE, CAPILLARY
GLUCOSE-CAPILLARY: 135 mg/dL — AB (ref 65–99)
GLUCOSE-CAPILLARY: 111 mg/dL — AB (ref 65–99)
Glucose-Capillary: 141 mg/dL — ABNORMAL HIGH (ref 65–99)

## 2017-08-05 LAB — CBC
HEMATOCRIT: 33.2 % — AB (ref 36.0–46.0)
HEMOGLOBIN: 11.2 g/dL — AB (ref 12.0–15.0)
MCH: 29.7 pg (ref 26.0–34.0)
MCHC: 33.7 g/dL (ref 30.0–36.0)
MCV: 88.1 fL (ref 78.0–100.0)
Platelets: 235 10*3/uL (ref 150–400)
RBC: 3.77 MIL/uL — ABNORMAL LOW (ref 3.87–5.11)
RDW: 13.9 % (ref 11.5–15.5)
WBC: 11 10*3/uL — ABNORMAL HIGH (ref 4.0–10.5)

## 2017-08-05 LAB — BASIC METABOLIC PANEL
Anion gap: 13 (ref 5–15)
BUN: 21 mg/dL — ABNORMAL HIGH (ref 6–20)
CHLORIDE: 84 mmol/L — AB (ref 101–111)
CO2: 30 mmol/L (ref 22–32)
CREATININE: 0.57 mg/dL (ref 0.44–1.00)
Calcium: 8.7 mg/dL — ABNORMAL LOW (ref 8.9–10.3)
GFR calc non Af Amer: >60 mL/min (ref >60)
Glucose, Bld: 137 mg/dL — ABNORMAL HIGH (ref 65–99)
Potassium: 4.2 mmol/L (ref 3.5–5.1)
Sodium: 127 mmol/L — ABNORMAL LOW (ref 135–145)

## 2017-08-05 MED ORDER — METHYLPREDNISOLONE SODIUM SUCC 40 MG IJ SOLR
40.0000 mg | Freq: Two times a day (BID) | INTRAMUSCULAR | Status: DC
  Administered 2017-08-05 – 2017-08-07 (×4): 40 mg via INTRAVENOUS
  Filled 2017-08-05 (×4): qty 1

## 2017-08-05 NOTE — Progress Notes (Signed)
SLP Cancellation Note  Patient Details Name: Natalie Thomas MRN: [id number] DOB: 12-Apr-1961   Cancelled treatment:        Pt now NPO with bowel obstruction. Will follow along and continue efforts.    Natalie Thomas 08/05/2017, 11:44 AM   Natalie Thomas.Ed [employer] [phone]

## 2017-08-05 NOTE — Progress Notes (Signed)
PROGRESS NOTE    Natalie Thomas  QMV:[id number] DOB: 1960-10-13 DOA: 07/30/2017 PCP: Natalie Shin, MD   Chief Complaint  Patient presents with  . Shortness of Breath  . Fall  . Constipation    X 1 WEEK    Brief Narrative:  HPI on 07/31/2017 by Natalie Thomas Natalie Thomas is a 57 y.o. female with medical history significant of COPD, GERD, depression, hypertension came to the ER with complaints of right hip pain after fall.  Patient states that yesterday afternoon while transferring herself from the wheelchair she ended up sustaining a fall on the right side hurting her right hip.  She denies any loss of consciousness or any other warning signs prior to the fall.  She states it was purely mechanical fall as she lost her balance.  Since then she has had right-sided hip pain.  During this time she also states she has noticed her shortness of breath has worsened and has some coughing but unable to bring up any sputum.  Denies any fevers, chills and other complaints.  She has not had a bowel movement in over a week now but thinks this may be due to poor p.o. appetite.  Interim history Admitted for hyponatremia, COPD exacerbation and right hip pain. Patient now complaining of vomiting. Found to have SBO, general surgery consulted. Assessment & Plan   Acute small bowel obstruction with abdominal pain and vomiting -CT abdomen pelvis showed high-grade small bowel obstruction with transition point within the lower right abdomen.  No obstructing cause identified and this may be secondary to adhesion. -Patient made n.p.o. -NG tube placed- has been clamped -General surgery consulted and appreciated, repeat abdominal x-ray today to assess bowel distention and to evaluate contrast -Continue to monitor closely  Leukocytosis -Likely multifactorial including small bowel obstruction, steroids -WBC trending downward, currently 11 -Continue to monitor CBC  Acute COPD exacerbation -Continue  Solu-Medrol, supplemental oxygen as needed, nebulizer treatments -Chest x-ray unremarkable for infection -Patient currently afebrile, no leukocytosis -wean Solu-Medrol  Hyponatremia -Baseline sodium approximately 130 -Unknown etiology- possibly due to antidepressant medications (patient is on Prozac, wellbutrin, latuda) -Denies alcohol use -Sodium on admission 114, currently up to 127 -Urine electrolytes: Urine sodium 65, urine osmolality 275 -was placed on normal saline and sodium tablets -On 08/02/2017: discussed with nephrology, Natalie Thomas, recommended discontinuing IVF and increasing sodium tablets to 2tabs TID, small dose of IV Lasix -Patient currently n.p.o., will have to hold sodium tablets -Continue IV Lasix and monitor BMP  Right-sided hip pain secondary to mechanical fall/right superior pubic ramus fracture -CT head/cervical spine unremarkable -Right hip x-ray showed no definite acute abnormality -Continue pain control -CT right hip: acute nondisplaced fracture of the right superior pubic ramus -PT/OT consulted-recommended SNF -Discussed with orthopedics, patient does not necessarily need outpatient follow-up.  Allow 6 weeks to heal. -Social work consulted for SNF placement  Constipation -KUB suggest large stool burden -Patient was able to have bowel movement on 08/01/2017 -Continue bowel regimen-continue scheduled MiraLAX, as needed Dulcolax -Patient was given Fleet enema by general surgery Appears to have a bowel movement this morning-  Hypokalemia/hypomagnesemia -Resolved, continue to monitor and replace as neded  Essential hypertension -continue IV Lasix, clonidine patch -Nifedipine and Coreg held due to n.p.o. Status -placed on IV metoprolol 2.5 mg IV scheduled along with IV hydralazine and labetalol as needed -BP better controlled today  Depression/anxiety -Will continue Xanax, Wellbutrin, Latuda, depakote, Paxil when SBO/ileus has resolved -There is some overlap  with these medications,  patient should follow-up with her psychiatrist or psychologist as an outpatient better medication management -Placed on IV Ativan  Hyperlipidemia -Statin held as patient is NPO  GERD -Continue PPI, on IV form  Diabetes mellitus, type II -Currently on no home medications, continue insulin sliding scale and CBG monitoring  Right ear bleeding -Secondary to patient placing her finger in her ear and picking at it. -appears to have improved -hemoglobin 11.2 -Continue to monitor CBC  DVT Prophylaxis  heparin  Code Status: Full  Family Communication: None at bedside  Disposition Plan: Admitted.  SNF when medically stable  Consultants Nephrology, via phone, Natalie Thomas Orthopedic via phone General surgery  Procedures  NG tube placement  Antibiotics   Anti-infectives (From admission, onward)   None      Subjective:   Natalie Thomas seen and examined today.  Patient states her breathing is improved.  Continues to feel some abdominal discomfort.  Would like to eat and get her medications by mouth.  Denies current chest pain, dizziness, headache. Objective:   Vitals:   08/04/17 2350 08/05/17 0131 08/05/17 0558 08/05/17 0804  BP: (!) 130/52 (!) 130/57 139/65   Pulse:   71   Resp:   18   Temp:   98.3 F (36.8 C)   TempSrc:  Oral Oral   SpO2:  100% 98% 97%  Weight:   88.2 kg (194 lb 7.1 oz)   Height:        Intake/Output Summary (Last 24 hours) at 08/05/2017 1009 Last data filed at 08/05/2017 0811 Gross per 24 hour  Intake 110 ml  Output 1330 ml  Net -1220 ml   Filed Weights   08/03/17 0547 08/04/17 0454 08/05/17 0558  Weight: 95.7 kg (210 lb 15.7 oz) 93.1 kg (205 lb 4 oz) 88.2 kg (194 lb 7.1 oz)   Exam  General: Well developed, chronically ill-appearing, NAD  HEENT: NCAT, mucous membranes moist.  NG tube in place  Cardiovascular: S1 S2 auscultated, no rubs, murmurs or gallops. Regular rate and rhythm.  Respiratory: Diminished breath  sounds, mild expiratory wheezing  Abdomen: Soft, obese, nontender, nondistended, + bowel sounds  Extremities: warm dry without cyanosis clubbing or edema  Neuro: AAOx3, nonfocal  Psych: anxious, appropriate  Data Reviewed: I have personally reviewed following labs and imaging studies  CBC: Recent Labs  Lab 08/01/17 0605 08/02/17 0451 08/03/17 0451 08/04/17 0735 08/05/17 0557  WBC 8.8 10.6* 11.6* 14.9* 11.0*  HGB 10.9* 10.7* 12.9 12.9 11.2*  HCT 32.2* 31.9* 37.1 38.2 33.2*  MCV 86.1 86.9 86.3 87.8 88.1  PLT 208 210 267 274 563   Basic Metabolic Panel: Recent Labs  Lab 07/31/17 0345  08/01/17 1424 08/02/17 0451 08/03/17 0451 08/04/17 0735 08/05/17 0557  NA  --    < > 120* 120* 121* 122* 127*  K  --    < > 4.9 5.2* 4.4 4.3 4.2  CL  --    < > 89* 88* 85* 81* 84*  CO2  --    < > 21* 22 26 28 30   GLUCOSE  --    < > 165* 136* 128* 140* 137*  BUN  --    < > 9 9 11 17  21*  CREATININE 0.37*   < > 0.64 0.55 0.54 0.57 0.57  CALCIUM  --    < > 8.5* 8.6* 8.9 8.7* 8.7*  MG 1.4*  --  1.9  --   --   --   --    < > =  values in this interval not displayed.   GFR: Estimated Creatinine Clearance: 87.2 mL/min (by C-G formula based on SCr of 0.57 mg/dL). Liver Function Tests: Recent Labs  Lab 07/30/17 2105 08/04/17 0735  AST 19 16  ALT 19 23  ALKPHOS 73 58  BILITOT 0.6 0.5  PROT 6.5 6.0*  ALBUMIN 3.1* 3.0*   No results for input(s): LIPASE, AMYLASE in the last 168 hours. No results for input(s): AMMONIA in the last 168 hours. Coagulation Profile: No results for input(s): INR, PROTIME in the last 168 hours. Cardiac Enzymes: No results for input(s): CKTOTAL, CKMB, CKMBINDEX, TROPONINI in the last 168 hours. BNP (last 3 results) No results for input(s): PROBNP in the last 8760 hours. HbA1C: No results for input(s): HGBA1C in the last 72 hours. CBG: Recent Labs  Lab 08/04/17 0725 08/04/17 1143 08/04/17 1608 08/04/17 2048 08/05/17 0733  GLUCAP 129* 149* 136* 129*  141*   Lipid Profile: No results for input(s): CHOL, HDL, LDLCALC, TRIG, CHOLHDL, LDLDIRECT in the last 72 hours. Thyroid Function Tests: No results for input(s): TSH, T4TOTAL, FREET4, T3FREE, THYROIDAB in the last 72 hours. Anemia Panel: No results for input(s): VITAMINB12, FOLATE, FERRITIN, TIBC, IRON, RETICCTPCT in the last 72 hours. Urine analysis:    Component Value Date/Time   COLORURINE YELLOW 07/31/2017 0114   APPEARANCEUR CLEAR 07/31/2017 0114   APPEARANCEUR Clear 06/22/2012 0618   LABSPEC 1.008 07/31/2017 0114   LABSPEC 1.003 06/22/2012 0618   PHURINE 7.0 07/31/2017 0114   GLUCOSEU 50 (A) 07/31/2017 0114   GLUCOSEU Negative 06/22/2012 0618   GLUCOSEU NEGATIVE 04/09/2011 1058   HGBUR NEGATIVE 07/31/2017 0114   BILIRUBINUR NEGATIVE 07/31/2017 0114   BILIRUBINUR Negative 06/22/2012 0618   KETONESUR 5 (A) 07/31/2017 0114   PROTEINUR NEGATIVE 07/31/2017 0114   UROBILINOGEN 1.0 08/18/2011 1805   NITRITE NEGATIVE 07/31/2017 0114   LEUKOCYTESUR NEGATIVE 07/31/2017 0114   LEUKOCYTESUR Trace 06/22/2012 0618   Sepsis Labs: @LABRCNTIP (procalcitonin:4,lacticidven:4)  ) Recent Results (from the past 240 hour(s))  MRSA PCR Screening     Status: None   Collection Time: 07/31/17  4:35 PM  Result Value Ref Range Status   MRSA by PCR NEGATIVE NEGATIVE Final    Comment:        The GeneXpert MRSA Assay (FDA approved for NASAL specimens only), is one component of a comprehensive MRSA colonization surveillance program. It is not intended to diagnose MRSA infection nor to guide or monitor treatment for MRSA infections. Performed at [facility], [place] [address] [place], [place] [address]       Radiology Studies: Ct Abdomen Pelvis W Contrast  Result Date: 08/03/2017 CLINICAL DATA:  Acute abdominal and pelvic pain with nausea and vomiting today. EXAM: CT ABDOMEN AND PELVIS WITH CONTRAST TECHNIQUE: Multidetector CT imaging of the abdomen and pelvis was performed using the  standard protocol following bolus administration of intravenous contrast. CONTRAST:  148mL ISOVUE-300 IOPAMIDOL (ISOVUE-300) INJECTION 61% COMPARISON:  07/25/2017 CT and prior studies FINDINGS: Lower chest: Trace bilateral pleural effusions and minimal bibasilar atelectasis noted. Mild cardiomegaly is present. Hepatobiliary: The liver is unremarkable. The patient is status post cholecystectomy. There is no evidence of biliary dilatation. Pancreas: Unremarkable Spleen: Unremarkable Adrenals/Urinary Tract: The kidneys, adrenal glands and bladder are unremarkable. Stomach/Bowel: There is a high-grade small bowel obstruction with transition point within the lower right abdomen (image 3 series 55 and series 7 image 87). No obstructing cause is identified and this may be secondary to an adhesion. There is no evidence of  pneumoperitoneum or abscess. A small umbilical hernia containing a loop of nondistended small bowel is present but this is not the site of bowel obstruction. Vascular/Lymphatic: Aortic atherosclerosis. No enlarged abdominal or pelvic lymph nodes. Reproductive: Unremarkable Other: A trace amount of free fluid within the pelvis is noted. Diffuse subcutaneous edema is present. Musculoskeletal: No acute or suspicious bony abnormalities noted. Lower thoracic and L2 compression fractures are again noted. IMPRESSION: 1. High-grade small bowel obstruction with transition point within the lower right abdomen. No obstructing cause identified and this may be secondary to an adhesion. 2. Small bilateral pleural effusions, subcutaneous edema and small amount of free pelvic fluid. 3. Small umbilical hernia containing a loop of nondistended small bowel, but not the site of bowel obstruction. 4.  Aortic Atherosclerosis (ICD10-I70.0). Electronically Signed   By: Margarette Canada M.D.   On: 08/03/2017 14:19   Dg Abd Portable 1v  Result Date: 08/05/2017 CLINICAL DATA:  Small bowel obstruction, nasogastric tube. EXAM: PORTABLE  ABDOMEN - 1 VIEW COMPARISON:  08/04/2017. FINDINGS: Slight improvement in gaseous distention of small bowel without resolution. Retained oral contrast in the colon. Nasogastric tube terminates in the stomach with the side port in the region of the gastroesophageal junction. IMPRESSION: Slight improvement in gaseous distension of small bowel, without resolution. Electronically Signed   By: Lorin Picket M.D.   On: 08/05/2017 09:34   Dg Abd Portable 1v-small Bowel Obstruction Protocol-initial, 8 Hr Delay  Result Date: 08/04/2017 CLINICAL DATA:  Small bowel obstruction 8 hour delay EXAM: PORTABLE ABDOMEN - 1 VIEW COMPARISON:  08/03/2017, CT 08/03/2017 FINDINGS: Esophageal tube tip projects over the proximal stomach, side-port is in the region of GE junction. Retained contrast in the gastric fundus. Surgical clips in the right upper quadrant. Residual contrast in the urinary bladder. Persistent gaseous enlargement of small bowel loops measuring up to 4.6 cm, consistent with bowel obstruction. No significant contrast within the colon. IMPRESSION: 1. Persistent dilated loops of small bowel, consistent with bowel obstruction. 2. Retained contrast within the stomach. Residual contrast noted in the bladder. Electronically Signed   By: Donavan Foil M.D.   On: 08/04/2017 02:56   Dg Abd Portable 1v  Result Date: 08/03/2017 CLINICAL DATA:  NG tube placement. EXAM: PORTABLE ABDOMEN - 1 VIEW COMPARISON:  CT, 08/03/2017 at 12:56 p.m. FINDINGS: Nasogastric tube passes below the diaphragm. Tip lies in the mid stomach. Dilated loops of small bowel are noted consistent with a partial obstruction as noted on the current CT. IMPRESSION: 1. Well-positioned nasogastric tube. Electronically Signed   By: Lajean Manes M.D.   On: 08/03/2017 17:12     Scheduled Meds: . aspirin EC  81 mg Oral Daily  . bisacodyl  10 mg Rectal Once  . buPROPion  150 mg Oral Daily  . cloNIDine  0.3 mg Transdermal Weekly  . ferrous sulfate  325  mg Oral Q breakfast  . fluticasone  2 spray Each Nare BID  . fluticasone furoate-vilanterol  1 puff Inhalation Daily  . furosemide  20 mg Intravenous Daily  . heparin  5,000 Units Subcutaneous Q8H  . insulin aspart  0-5 Units Subcutaneous QHS  . insulin aspart  0-9 Units Subcutaneous TID WC  . ipratropium-albuterol  3 mL Nebulization TID  . lurasidone  80 mg Oral Q breakfast  . methylPREDNISolone (SOLU-MEDROL) injection  40 mg Intravenous Q6H  . metoprolol tartrate  2.5 mg Intravenous Q6H  . pantoprazole (PROTONIX) IV  40 mg Intravenous Q24H  . PARoxetine  40 mg  Oral Daily  . pravastatin  20 mg Oral Daily  . sucralfate  1 g Oral QID   Continuous Infusions: . valproate sodium Stopped (08/04/17 2300)     LOS: 5 days   Time Spent in minutes  30 minutes  Kechia Yahnke D.O. on 08/05/2017 at 10:09 AM  Between 7am to 7pm - Pager - [phone]  After 7pm go to [email or web address] - password [email or web address]  And look for the night coverage person covering for me after hours  [facility] Office  [phone]

## 2017-08-05 NOTE — Care Management Important Message (Signed)
Important Message  Patient Details  Name: Natalie Thomas MRN: [id number] Date of Birth: 10-15-1960   Medicare Important Message Given:  Yes    Kameko Hukill 08/05/2017, 1:15 PM

## 2017-08-05 NOTE — Progress Notes (Signed)
Pt is is alert and oriented with n/g tube intact with bloody bile MD paged. To make aware. NO nausea or vomiting

## 2017-08-05 NOTE — Progress Notes (Signed)
Subjective/Chief Complaint: Denies abdominal pain Reports that she is getting ready to have a BM   Objective: Vital signs in last 24 hours: Temp:  [98 F (36.7 C)-99.5 F (37.5 C)] 98.3 F (36.8 C) (02/11 0558) Pulse Rate:  [66-72] 71 (02/11 0558) Resp:  [18] 18 (02/11 0558) BP: (130-179)/(47-78) 139/65 (02/11 0558) SpO2:  [94 %-100 %] 97 % (02/11 0804) Weight:  [88.2 kg (194 lb 7.1 oz)] 88.2 kg (194 lb 7.1 oz) (02/11 0558) Last BM Date: 08/04/17  Intake/Output from previous day: 02/10 0701 - 02/11 0700 In: 110 [IV Piggyback:110] Out: 1330 [Urine:400; Emesis/NG output:930] Intake/Output this shift: No intake/output data recorded.  Exam: Abdomen soft, non tender, non distended  Lab Results:  Recent Labs    08/04/17 0735 08/05/17 0557  WBC 14.9* 11.0*  HGB 12.9 11.2*  HCT 38.2 33.2*  PLT 274 235   BMET Recent Labs    08/04/17 0735 08/05/17 0557  NA 122* 127*  K 4.3 4.2  CL 81* 84*  CO2 28 30  GLUCOSE 140* 137*  BUN 17 21*  CREATININE 0.57 0.57  CALCIUM 8.7* 8.7*   PT/INR No results for input(s): LABPROT, INR in the last 72 hours. ABG No results for input(s): PHART, HCO3 in the last 72 hours.  Invalid input(s): PCO2, PO2  Studies/Results: Ct Abdomen Pelvis W Contrast  Result Date: 08/03/2017 CLINICAL DATA:  Acute abdominal and pelvic pain with nausea and vomiting today. EXAM: CT ABDOMEN AND PELVIS WITH CONTRAST TECHNIQUE: Multidetector CT imaging of the abdomen and pelvis was performed using the standard protocol following bolus administration of intravenous contrast. CONTRAST:  122mL ISOVUE-300 IOPAMIDOL (ISOVUE-300) INJECTION 61% COMPARISON:  07/25/2017 CT and prior studies FINDINGS: Lower chest: Trace bilateral pleural effusions and minimal bibasilar atelectasis noted. Mild cardiomegaly is present. Hepatobiliary: The liver is unremarkable. The patient is status post cholecystectomy. There is no evidence of biliary dilatation. Pancreas: Unremarkable  Spleen: Unremarkable Adrenals/Urinary Tract: The kidneys, adrenal glands and bladder are unremarkable. Stomach/Bowel: There is a high-grade small bowel obstruction with transition point within the lower right abdomen (image 3 series 55 and series 7 image 87). No obstructing cause is identified and this may be secondary to an adhesion. There is no evidence of pneumoperitoneum or abscess. A small umbilical hernia containing a loop of nondistended small bowel is present but this is not the site of bowel obstruction. Vascular/Lymphatic: Aortic atherosclerosis. No enlarged abdominal or pelvic lymph nodes. Reproductive: Unremarkable Other: A trace amount of free fluid within the pelvis is noted. Diffuse subcutaneous edema is present. Musculoskeletal: No acute or suspicious bony abnormalities noted. Lower thoracic and L2 compression fractures are again noted. IMPRESSION: 1. High-grade small bowel obstruction with transition point within the lower right abdomen. No obstructing cause identified and this may be secondary to an adhesion. 2. Small bilateral pleural effusions, subcutaneous edema and small amount of free pelvic fluid. 3. Small umbilical hernia containing a loop of nondistended small bowel, but not the site of bowel obstruction. 4.  Aortic Atherosclerosis (ICD10-I70.0). Electronically Signed   By: Margarette Canada M.D.   On: 08/03/2017 14:19   Dg Abd Portable 1v-small Bowel Obstruction Protocol-initial, 8 Hr Delay  Result Date: 08/04/2017 CLINICAL DATA:  Small bowel obstruction 8 hour delay EXAM: PORTABLE ABDOMEN - 1 VIEW COMPARISON:  08/03/2017, CT 08/03/2017 FINDINGS: Esophageal tube tip projects over the proximal stomach, side-port is in the region of GE junction. Retained contrast in the gastric fundus. Surgical clips in the right upper quadrant. Residual contrast  in the urinary bladder. Persistent gaseous enlargement of small bowel loops measuring up to 4.6 cm, consistent with bowel obstruction. No significant  contrast within the colon. IMPRESSION: 1. Persistent dilated loops of small bowel, consistent with bowel obstruction. 2. Retained contrast within the stomach. Residual contrast noted in the bladder. Electronically Signed   By: Donavan Foil M.D.   On: 08/04/2017 02:56   Dg Abd Portable 1v  Result Date: 08/03/2017 CLINICAL DATA:  NG tube placement. EXAM: PORTABLE ABDOMEN - 1 VIEW COMPARISON:  CT, 08/03/2017 at 12:56 p.m. FINDINGS: Nasogastric tube passes below the diaphragm. Tip lies in the mid stomach. Dilated loops of small bowel are noted consistent with a partial obstruction as noted on the current CT. IMPRESSION: 1. Well-positioned nasogastric tube. Electronically Signed   By: Lajean Manes M.D.   On: 08/03/2017 17:12    Anti-infectives: Anti-infectives (From admission, onward)   None      Assessment/Plan:  SBO vs ileus  Will repeat abdominal xray this morning to assess bowel distension and see if contrast has progressed. NG currently clamped  LOS: 5 days    Natalie Thomas A 08/05/2017

## 2017-08-05 NOTE — Clinical Social Work Note (Signed)
PASARR screen submitted and under manual review.  Natalie Thomas, [place]

## 2017-08-05 NOTE — Progress Notes (Signed)
Reported that NG tube had bloody output this morning. Nightshift RN clamped NG tube until further instruction from MD. Pt with no other symptoms during shift change.

## 2017-08-05 NOTE — Progress Notes (Signed)
RT found pt on 3L [place].

## 2017-08-06 LAB — BASIC METABOLIC PANEL
ANION GAP: 13 (ref 5–15)
BUN: 21 mg/dL — ABNORMAL HIGH (ref 6–20)
CHLORIDE: 86 mmol/L — AB (ref 101–111)
CO2: 30 mmol/L (ref 22–32)
CREATININE: 0.44 mg/dL (ref 0.44–1.00)
Calcium: 8.6 mg/dL — ABNORMAL LOW (ref 8.9–10.3)
GFR calc non Af Amer: >60 mL/min (ref >60)
Glucose, Bld: 130 mg/dL — ABNORMAL HIGH (ref 65–99)
Potassium: 3.7 mmol/L (ref 3.5–5.1)
Sodium: 129 mmol/L — ABNORMAL LOW (ref 135–145)

## 2017-08-06 LAB — GLUCOSE, CAPILLARY
GLUCOSE-CAPILLARY: 131 mg/dL — AB (ref 65–99)
GLUCOSE-CAPILLARY: 132 mg/dL — AB (ref 65–99)
Glucose-Capillary: 111 mg/dL — ABNORMAL HIGH (ref 65–99)
Glucose-Capillary: 136 mg/dL — ABNORMAL HIGH (ref 65–99)
Glucose-Capillary: 121 mg/dL — ABNORMAL HIGH (ref 65–99)

## 2017-08-06 LAB — CBC
HEMATOCRIT: 32.6 % — AB (ref 36.0–46.0)
HEMOGLOBIN: 11 g/dL — AB (ref 12.0–15.0)
MCH: 29.8 pg (ref 26.0–34.0)
MCHC: 33.7 g/dL (ref 30.0–36.0)
MCV: 88.3 fL (ref 78.0–100.0)
Platelets: 218 10*3/uL (ref 150–400)
RBC: 3.69 MIL/uL — ABNORMAL LOW (ref 3.87–5.11)
RDW: 13.8 % (ref 11.5–15.5)
WBC: 10.1 10*3/uL (ref 4.0–10.5)

## 2017-08-06 NOTE — Progress Notes (Signed)
PROGRESS NOTE    Natalie Thomas  CWC:[id number] DOB: 12-Oct-1960 DOA: 07/30/2017 PCP: Renato Shin, MD   Chief Complaint  Patient presents with  . Shortness of Breath  . Fall  . Constipation    X 1 WEEK    Brief Narrative:  HPI on 07/31/2017 by Dr. Gerlean Ren Natalie Thomas is a 58 y.o. female with medical history significant of COPD, GERD, depression, hypertension came to the ER with complaints of right hip pain after fall.  Patient states that yesterday afternoon while transferring herself from the wheelchair she ended up sustaining a fall on the right side hurting her right hip.  She denies any loss of consciousness or any other warning signs prior to the fall.  She states it was purely mechanical fall as she lost her balance.  Since then she has had right-sided hip pain.  During this time she also states she has noticed her shortness of breath has worsened and has some coughing but unable to bring up any sputum.  Denies any fevers, chills and other complaints.  She has not had a bowel movement in over a week now but thinks this may be due to poor p.o. appetite.  Interim history Admitted for hyponatremia, COPD exacerbation and right hip pain. Patient now complaining of vomiting. Found to have SBO, general surgery consulted. NG tube has been removed. Assessment & Plan   Acute small bowel obstruction with abdominal pain and vomiting -CT abdomen pelvis showed high-grade small bowel obstruction with transition point within the lower right abdomen.  No obstructing cause identified and this may be secondary to adhesion. -NG tube placed- has been removed -General surgery consulted and appreciated, recommended starting clears -repeat abdominal xray from 2/11: shows improvement -Continue to monitor closely  Leukocytosis -Likely multifactorial including small bowel obstruction, steroids -WBC trending downward, currently 11 -Continue to monitor CBC  Acute COPD exacerbation -Continue  Solu-Medrol, supplemental oxygen as needed, nebulizer treatments -Chest x-ray unremarkable for infection -Patient currently afebrile, no leukocytosis -Continue to wean Solu-Medrol -per patient, breathing improving   Hyponatremia -Baseline sodium approximately 130 -Unknown etiology- possibly due to antidepressant medications (patient is on Prozac, wellbutrin, latuda) -Denies alcohol use -Sodium on admission 114, currently up to 129 -Urine electrolytes: Urine sodium 65, urine osmolality 275 -was placed on normal saline and sodium tablets -On 08/02/2017: discussed with nephrology, Dr. Posey Pronto, recommended discontinuing IVF and increasing sodium tablets to 2tabs TID, small dose of IV Lasix -patient restarted on clears, once able to tolerate, may consider restarting sodium tablets -Continue IV Lasix and monitor BMP  Right-sided hip pain secondary to mechanical fall/right superior pubic ramus fracture -CT head/cervical spine unremarkable -Right hip x-ray showed no definite acute abnormality -Continue pain control -CT right hip: acute nondisplaced fracture of the right superior pubic ramus -PT/OT consulted-recommended SNF -Discussed with orthopedics, patient does not necessarily need outpatient follow-up.  Allow 6 weeks to heal. -Social work consulted for SNF placement  Constipation -KUB suggest large stool burden -Patient was able to have bowel movement on 08/01/2017 -Continue bowel regimen-continue scheduled MiraLAX, as needed Dulcolax -Patient was given Fleet enema by general surgery -Improved, patient appears to have had 4 bowel movements on 08/05/2017  Hypokalemia/hypomagnesemia -Resolved, continue to monitor and replace as neded  Essential hypertension -continue IV Lasix, clonidine patch -Nifedipine and Coreg held due to n.p.o. Status -placed on IV metoprolol 2.5 mg IV scheduled along with IV hydralazine and labetalol as needed -Will restart home medications once patient is able to  tolerate  clear liquids  Depression/anxiety -Will continue Xanax, Wellbutrin, Latuda, depakote, Paxil when SBO/ileus has resolved -There is some overlap with these medications, patient should follow-up with her psychiatrist or psychologist as an outpatient better medication management -Placed on IV Ativan  Hyperlipidemia -Restart statin  GERD -Continue PPI, on IV form -Transition to oral form once able to tolerate liquids  Diabetes mellitus, type II -Currently on no home medications, continue insulin sliding scale and CBG monitoring  Right ear bleeding -Secondary to patient placing her finger in her ear and picking at it. -appears to have improved -hemoglobin 11.0 -Continue to monitor CBC  DVT Prophylaxis  heparin  Code Status: Full  Family Communication: None at bedside  Disposition Plan: Admitted.  SNF when medically stable, suspected in the next 24-48 hours  Consultants Nephrology, via phone, Dr. Posey Pronto Orthopedic via phone General surgery  Procedures  NG tube placement  Antibiotics   Anti-infectives (From admission, onward)   None      Subjective:   Natalie Thomas seen and examined today.  Patient feels her breathing has improved.  Continues to complain of pain in several areas, her back her abdomen, and leg.  Currently denies any further nausea or vomiting.  Would like to restart some of her home medications.  Currently denies chest pain, dizziness or headache.  Objective:   Vitals:   08/06/17 0330 08/06/17 0539 08/06/17 0601 08/06/17 0732  BP: (!) 166/50 (!) 163/63    Pulse: (!) 59 68    Resp: 18 18 18    Temp: (!) 97.5 F (36.4 C) 98.7 F (37.1 C)    TempSrc: Axillary Oral    SpO2: 96% 96%  93%  Weight:  85.4 kg (188 lb 4.4 oz)    Height:        Intake/Output Summary (Last 24 hours) at 08/06/2017 1029 Last data filed at 08/06/2017 1023 Gross per 24 hour  Intake 720 ml  Output 1100 ml  Net -380 ml   Filed Weights   08/04/17 0454 08/05/17  0558 08/06/17 0539  Weight: 93.1 kg (205 lb 4 oz) 88.2 kg (194 lb 7.1 oz) 85.4 kg (188 lb 4.4 oz)   Exam  General: Well developed, chronically ill-appearing, no apparent distress  HEENT: NCAT, mucous membranes moist.   Cardiovascular: S1 S2 auscultated, RRR, no murmur.  Respiratory: Diminished breath sounds with mild expiratory wheezing  Abdomen: Soft, obese, nontender, nondistended, + bowel sounds  Extremities: warm dry without cyanosis clubbing or edema  Neuro: AAOx3, nonfocal  Psych: appropriate mood and affect  Data Reviewed: I have personally reviewed following labs and imaging studies  CBC: Recent Labs  Lab 08/02/17 0451 08/03/17 0451 08/04/17 0735 08/05/17 0557 08/06/17 0455  WBC 10.6* 11.6* 14.9* 11.0* 10.1  HGB 10.7* 12.9 12.9 11.2* 11.0*  HCT 31.9* 37.1 38.2 33.2* 32.6*  MCV 86.9 86.3 87.8 88.1 88.3  PLT 210 267 274 235 203   Basic Metabolic Panel: Recent Labs  Lab 07/31/17 0345  08/01/17 1424 08/02/17 0451 08/03/17 0451 08/04/17 0735 08/05/17 0557 08/06/17 0455  NA  --    < > 120* 120* 121* 122* 127* 129*  K  --    < > 4.9 5.2* 4.4 4.3 4.2 3.7  CL  --    < > 89* 88* 85* 81* 84* 86*  CO2  --    < > 21* 22 26 28 30 30   GLUCOSE  --    < > 165* 136* 128* 140* 137* 130*  BUN  --    < >  9 9 11 17  21* 21*  CREATININE 0.37*   < > 0.64 0.55 0.54 0.57 0.57 0.44  CALCIUM  --    < > 8.5* 8.6* 8.9 8.7* 8.7* 8.6*  MG 1.4*  --  1.9  --   --   --   --   --    < > = values in this interval not displayed.   GFR: Estimated Creatinine Clearance: 85.8 mL/min (by C-G formula based on SCr of 0.44 mg/dL). Liver Function Tests: Recent Labs  Lab 07/30/17 2105 08/04/17 0735  AST 19 16  ALT 19 23  ALKPHOS 73 58  BILITOT 0.6 0.5  PROT 6.5 6.0*  ALBUMIN 3.1* 3.0*   No results for input(s): LIPASE, AMYLASE in the last 168 hours. No results for input(s): AMMONIA in the last 168 hours. Coagulation Profile: No results for input(s): INR, PROTIME in the last 168  hours. Cardiac Enzymes: No results for input(s): CKTOTAL, CKMB, CKMBINDEX, TROPONINI in the last 168 hours. BNP (last 3 results) No results for input(s): PROBNP in the last 8760 hours. HbA1C: No results for input(s): HGBA1C in the last 72 hours. CBG: Recent Labs  Lab 08/05/17 0733 08/05/17 1116 08/05/17 1723 08/05/17 2040 08/06/17 0741  GLUCAP 141* 135* 131* 111* 111*   Lipid Profile: No results for input(s): CHOL, HDL, LDLCALC, TRIG, CHOLHDL, LDLDIRECT in the last 72 hours. Thyroid Function Tests: No results for input(s): TSH, T4TOTAL, FREET4, T3FREE, THYROIDAB in the last 72 hours. Anemia Panel: No results for input(s): VITAMINB12, FOLATE, FERRITIN, TIBC, IRON, RETICCTPCT in the last 72 hours. Urine analysis:    Component Value Date/Time   COLORURINE YELLOW 07/31/2017 0114   APPEARANCEUR CLEAR 07/31/2017 0114   APPEARANCEUR Clear 06/22/2012 0618   LABSPEC 1.008 07/31/2017 0114   LABSPEC 1.003 06/22/2012 0618   PHURINE 7.0 07/31/2017 0114   GLUCOSEU 50 (A) 07/31/2017 0114   GLUCOSEU Negative 06/22/2012 0618   GLUCOSEU NEGATIVE 04/09/2011 1058   HGBUR NEGATIVE 07/31/2017 0114   BILIRUBINUR NEGATIVE 07/31/2017 0114   BILIRUBINUR Negative 06/22/2012 0618   KETONESUR 5 (A) 07/31/2017 0114   PROTEINUR NEGATIVE 07/31/2017 0114   UROBILINOGEN 1.0 08/18/2011 1805   NITRITE NEGATIVE 07/31/2017 0114   LEUKOCYTESUR NEGATIVE 07/31/2017 0114   LEUKOCYTESUR Trace 06/22/2012 0618   Sepsis Labs: @LABRCNTIP (procalcitonin:4,lacticidven:4)  ) Recent Results (from the past 240 hour(s))  MRSA PCR Screening     Status: None   Collection Time: 07/31/17  4:35 PM  Result Value Ref Range Status   MRSA by PCR NEGATIVE NEGATIVE Final    Comment:        The GeneXpert MRSA Assay (FDA approved for NASAL specimens only), is one component of a comprehensive MRSA colonization surveillance program. It is not intended to diagnose MRSA infection nor to guide or monitor treatment for MRSA  infections. Performed at [facility], [place] [address], [place], [place] [address]       Radiology Studies: Dg Abd Portable 1v  Result Date: 08/05/2017 CLINICAL DATA:  Small bowel obstruction, nasogastric tube. EXAM: PORTABLE ABDOMEN - 1 VIEW COMPARISON:  08/04/2017. FINDINGS: Slight improvement in gaseous distention of small bowel without resolution. Retained oral contrast in the colon. Nasogastric tube terminates in the stomach with the side port in the region of the gastroesophageal junction. IMPRESSION: Slight improvement in gaseous distension of small bowel, without resolution. Electronically Signed   By: Lorin Picket M.D.   On: 08/05/2017 09:34     Scheduled Meds: . aspirin EC  81  mg Oral Daily  . bisacodyl  10 mg Rectal Once  . buPROPion  150 mg Oral Daily  . cloNIDine  0.3 mg Transdermal Weekly  . ferrous sulfate  325 mg Oral Q breakfast  . fluticasone  2 spray Each Nare BID  . fluticasone furoate-vilanterol  1 puff Inhalation Daily  . furosemide  20 mg Intravenous Daily  . heparin  5,000 Units Subcutaneous Q8H  . insulin aspart  0-5 Units Subcutaneous QHS  . insulin aspart  0-9 Units Subcutaneous TID WC  . ipratropium-albuterol  3 mL Nebulization TID  . lurasidone  80 mg Oral Q breakfast  . methylPREDNISolone (SOLU-MEDROL) injection  40 mg Intravenous Q12H  . metoprolol tartrate  2.5 mg Intravenous Q6H  . pantoprazole (PROTONIX) IV  40 mg Intravenous Q24H  . PARoxetine  40 mg Oral Daily  . pravastatin  20 mg Oral Daily  . sucralfate  1 g Oral QID   Continuous Infusions: . valproate sodium Stopped (08/05/17 2330)     LOS: 6 days   Time Spent in minutes  30 minutes  Symphani Eckstrom D.O. on 08/06/2017 at 10:29 AM  Between 7am to 7pm - Pager - [phone]  After 7pm go to [email or web address] - password [email or web address]  And look for the night coverage person covering for me after hours  [facility] Office  [phone]

## 2017-08-06 NOTE — Progress Notes (Signed)
  Speech Language Pathology Treatment: Dysphagia  Patient Details Name: Natalie Thomas MRN: [id number] DOB: 12-22-1960 Today's Date: 08/06/2017 Time: 4235-3614 SLP Time Calculation (min) (ACUTE ONLY): 13 min  Assessment / Plan / Recommendation Clinical Impression  Pt reports she is tired this morning. MD initiated clear liquids due to improvements in bowel obstruction. Delayed coughs with straw sips thin this morning decreased with use of cup (ice removed for cup sip). Limited intake due to fatigue and "not overdo it." Reviewed results of bedside swallow which suspected pharyngeal or cervical esophageal dysphagia. Verbal reminders for small sips. Continue clear liquid diet per MD and will follow for upgrade to solids when able.    HPI HPI: Pt is a 57 y/o female admitted secondary to increased SOB and hyponatremia. Pt also reports fall which resulted in R hip pain. Imaging for R hip negative for acute abnormality. CT of head/spine negative for acute abnormality. PMH includes CHF, HTN, COPD on 2L, DM, GERD and osteoporosis.CXR No active cardiopulmonary disease. Order states "pt on chopped meats at [place] with current plan of care       Recommendations  Diet recommendations: Thin liquid(clear liquids per MD) Liquids provided via: Cup Medication Administration: Whole meds with liquid Supervision: Patient able to self feed;Intermittent supervision to cue for compensatory strategies Compensations: Slow rate;Small sips/bites;Follow solids with liquid Postural Changes and/or Swallow Maneuvers: Seated upright 90 degrees;Upright 30-60 min after meal                Oral Care Recommendations: Oral care BID Follow up Recommendations: (TBD) SLP Visit Diagnosis: Dysphagia, unspecified (R13.10) Plan: Continue with current plan of care                      Houston Siren 08/06/2017, 8:51 AM   Orbie Pyo Colvin Caroli.[employer]  [phone]

## 2017-08-06 NOTE — Clinical Social Work Note (Addendum)
CSW uploaded requested information to [facility] for [facility] review. CSW left voicemail for patient's legal guardian. Will provide SNF bed offers once he returns call.  Dayton Scrape, [place] [phone]  2:14 pm PASARR is under level 2 review.  Dayton Scrape, [place]

## 2017-08-06 NOTE — Progress Notes (Signed)
Physical Therapy Treatment Patient Details Name: Natalie Thomas MRN: [id number] DOB: May 05, 1961 Today's Date: 08/06/2017    History of Present Illness Pt is a 57 y/o female admitted 07/30/17 secondary to increased SOB and hyponatremia; pt also reports fall which resulted in R hip pain. CT of head/spine negative for acute abnormality. CT of R hip shows acute nondisplaced fx of right superior pubic ramus. PMH includes CHF, HTN, COPD on 2L, DM, and osteoporosis.     PT Comments    Pt more receptive to PT intervention during session this pm.  Balance and strength deficits are still present but patient's pain is more controlled and she is able to tolerate therapeutic exercises and increased functional mobility.  Plan for SNF remains appropriate as she remains to improve on a slower path and will benefit from short term rehab before returning home.    Follow Up Recommendations  SNF;Supervision/Assistance - 24 hour     Equipment Recommendations       Recommendations for Other Services       Precautions / Restrictions Precautions Precautions: Fall Precaution Comments: Pt reports fall from [facility] after not locking her brakes on her WC.  Restrictions Weight Bearing Restrictions: No    Mobility  Bed Mobility Overal bed mobility: Needs Assistance Bed Mobility: Supine to Sit Rolling: Min guard         General bed mobility comments: Pt able to advance LEs to edge of bed and elevate trunk into sitting with HOB elevated. Cues to scoot forward toward EOB to prepare for transfer OOB.   Transfers Overall transfer level: Needs assistance Equipment used: Rolling walker (2 wheeled) Transfers: Sit to/from Stand Sit to Stand: Min guard;Min assist         General transfer comment: Cues for hand placement to and from seated surface.  Pt with poor eccentric loading when returning to seated surface and therefore required increased assistance.    Ambulation/Gait Ambulation/Gait assistance: Min  assist Ambulation Distance (Feet): 80 Feet Assistive device: Rolling walker (2 wheeled) Gait Pattern/deviations: Step-to pattern;Decreased stride length;Wide base of support;Antalgic     General Gait Details: Cues for RW safety and sequencing to reduce pain in RLE.  Cues for upper trunk control and forward gaze to improve posture.  LOB noted x1.     Stairs            Wheelchair Mobility    Modified Rankin (Stroke Patients Only)       Balance     Sitting balance-Leahy Scale: Good       Standing balance-Leahy Scale: Poor Standing balance comment: Heavily reliant on UE support.                              Cognition Arousal/Alertness: Awake/alert Behavior During Therapy: WFL for tasks assessed/performed Overall Cognitive Status: Within Functional Limits for tasks assessed                                        Exercises General Exercises - Lower Extremity Ankle Circles/Pumps: AROM;Both;20 reps;Supine Quad Sets: AROM;Right;10 reps;Supine Heel Slides: AROM;Right;10 reps;Supine    General Comments        Pertinent Vitals/Pain Pain Assessment: 0-10 Pain Score: 5  Pain Location: R hip pain  Pain Descriptors / Indicators: Sharp Pain Intervention(s): Monitored during session;Repositioned;Patient requesting pain meds-RN notified    Home Living  Prior Function            PT Goals (current goals can now be found in the care plan section) Acute Rehab PT Goals Patient Stated Goal: to decrease pain  Potential to Achieve Goals: Fair Progress towards PT goals: Progressing toward goals    Frequency    Min 2X/week      PT Plan Current plan remains appropriate    Co-evaluation              AM-PAC PT "6 Clicks" Daily Activity  Outcome Measure  Difficulty turning over in bed (including adjusting bedclothes, sheets and blankets)?: Unable Difficulty moving from lying on back to sitting on the  side of the bed? : Unable Difficulty sitting down on and standing up from a chair with arms (e.g., wheelchair, bedside commode, etc,.)?: Unable Help needed moving to and from a bed to chair (including a wheelchair)?: A Little Help needed walking in hospital room?: A Little Help needed climbing 3-5 steps with a railing? : A Little 6 Click Score: 12    End of Session Equipment Utilized During Treatment: Gait belt Activity Tolerance: Patient limited by pain Patient left: with call bell/phone within reach;in chair;with chair alarm set Nurse Communication: Mobility status PT Visit Diagnosis: Other abnormalities of gait and mobility (R26.89);Muscle weakness (generalized) (M62.81);Pain Pain - Right/Left: Right Pain - part of body: Hip     Time: 6440-3474 PT Time Calculation (min) (ACUTE ONLY): 25 min  Charges:  $Gait Training: 8-22 mins $Therapeutic Activity: 8-22 mins                    G Codes:       Governor Rooks, PTA pager [phone]    Cristela Blue 08/06/2017, 5:55 PM

## 2017-08-06 NOTE — Progress Notes (Signed)
   Subjective/Chief Complaint: Reports multiple BM's Denies abdominal pain   Objective: Vital signs in last 24 hours: Temp:  [97.5 F (36.4 C)-98.7 F (37.1 C)] 98.7 F (37.1 C) (02/12 0539) Pulse Rate:  [56-68] 68 (02/12 0539) Resp:  [16-18] 18 (02/12 0601) BP: (163-201)/(50-66) 163/63 (02/12 0539) SpO2:  [94 %-97 %] 96 % (02/12 0539) Weight:  [85.4 kg (188 lb 4.4 oz)] 85.4 kg (188 lb 4.4 oz) (02/12 0539) Last BM Date: 08/05/17  Intake/Output from previous day: 02/11 0701 - 02/12 0700 In: 0  Out: 1100 [Urine:1100] Intake/Output this shift: No intake/output data recorded.  Exam: Abdomen soft, non-distended, non-tender  Lab Results:  Recent Labs    08/05/17 0557 08/06/17 0455  WBC 11.0* 10.1  HGB 11.2* 11.0*  HCT 33.2* 32.6*  PLT 235 218   BMET Recent Labs    08/05/17 0557 08/06/17 0455  NA 127* 129*  K 4.2 3.7  CL 84* 86*  CO2 30 30  GLUCOSE 137* 130*  BUN 21* 21*  CREATININE 0.57 0.44  CALCIUM 8.7* 8.6*   PT/INR No results for input(s): LABPROT, INR in the last 72 hours. ABG No results for input(s): PHART, HCO3 in the last 72 hours.  Invalid input(s): PCO2, PO2  Studies/Results: Dg Abd Portable 1v  Result Date: 08/05/2017 CLINICAL DATA:  Small bowel obstruction, nasogastric tube. EXAM: PORTABLE ABDOMEN - 1 VIEW COMPARISON:  08/04/2017. FINDINGS: Slight improvement in gaseous distention of small bowel without resolution. Retained oral contrast in the colon. Nasogastric tube terminates in the stomach with the side port in the region of the gastroesophageal junction. IMPRESSION: Slight improvement in gaseous distension of small bowel, without resolution. Electronically Signed   By: Lorin Picket M.D.   On: 08/05/2017 09:34    Anti-infectives: Anti-infectives (From admission, onward)   None      Assessment/Plan: ISBO vs Ileus  Appears to be resolving again Will d/c NG and start clears  LOS: 6 days    Natalie Thomas A 08/06/2017

## 2017-08-06 NOTE — Progress Notes (Signed)
NG tube removed per MD order

## 2017-08-07 ENCOUNTER — Inpatient Hospital Stay (HOSPITAL_COMMUNITY): Payer: Medicare Other

## 2017-08-07 DIAGNOSIS — K5900 Constipation, unspecified: Secondary | ICD-10-CM

## 2017-08-07 LAB — CBC
HCT: 33.2 % — ABNORMAL LOW (ref 36.0–46.0)
Hemoglobin: 11.3 g/dL — ABNORMAL LOW (ref 12.0–15.0)
MCH: 29.7 pg (ref 26.0–34.0)
MCHC: 34 g/dL (ref 30.0–36.0)
MCV: 87.4 fL (ref 78.0–100.0)
Platelets: 230 10*3/uL (ref 150–400)
RBC: 3.8 MIL/uL — ABNORMAL LOW (ref 3.87–5.11)
RDW: 13.5 % (ref 11.5–15.5)
WBC: 9.1 10*3/uL (ref 4.0–10.5)

## 2017-08-07 LAB — BASIC METABOLIC PANEL
Anion gap: 12 (ref 5–15)
BUN: 10 mg/dL (ref 6–20)
CALCIUM: 8.6 mg/dL — AB (ref 8.9–10.3)
CO2: 31 mmol/L (ref 22–32)
CREATININE: 0.5 mg/dL (ref 0.44–1.00)
Chloride: 84 mmol/L — ABNORMAL LOW (ref 101–111)
GFR calc non Af Amer: >60 mL/min (ref >60)
Glucose, Bld: 133 mg/dL — ABNORMAL HIGH (ref 65–99)
Potassium: 3.8 mmol/L (ref 3.5–5.1)
SODIUM: 127 mmol/L — AB (ref 135–145)

## 2017-08-07 LAB — GLUCOSE, CAPILLARY
Glucose-Capillary: 118 mg/dL — ABNORMAL HIGH (ref 65–99)
Glucose-Capillary: 192 mg/dL — ABNORMAL HIGH (ref 65–99)
Glucose-Capillary: 183 mg/dL — ABNORMAL HIGH (ref 65–99)

## 2017-08-07 MED ORDER — POLYETHYLENE GLYCOL 3350 17 G PO PACK
17.0000 g | PACK | Freq: Every day | ORAL | Status: DC
  Administered 2017-08-07 – 2017-08-12 (×6): 17 g via ORAL
  Filled 2017-08-07 (×6): qty 1

## 2017-08-07 MED ORDER — PANTOPRAZOLE SODIUM 40 MG PO TBEC
40.0000 mg | DELAYED_RELEASE_TABLET | Freq: Every day | ORAL | Status: DC
  Administered 2017-08-07 – 2017-08-11 (×6): 40 mg via ORAL
  Filled 2017-08-07 (×5): qty 1

## 2017-08-07 MED ORDER — SENNA 8.6 MG PO TABS
1.0000 | ORAL_TABLET | Freq: Every day | ORAL | Status: DC
  Administered 2017-08-07 – 2017-08-12 (×6): 8.6 mg via ORAL
  Filled 2017-08-07 (×6): qty 1

## 2017-08-07 NOTE — Progress Notes (Signed)
  Speech Language Pathology Treatment: Dysphagia  Patient Details Name: Natalie Thomas MRN: [id number] DOB: [birth year] Today's Date: 08/07/2017 Time: 3785-8850 SLP Time Calculation (min) (ACUTE ONLY): 12 min  Assessment / Plan / Recommendation Clinical Impression  Pt had abdominal discomfort this morning and ultrasound performed showing persistent partial small bowel obstruction; no perforation. She feels somewhat better than this morning and required min verbal cues to put head of bed higher before consuming coffee. Subtle throat clears that have been consistent over sessions although aspiration not suspected. Throat clears suspected to be related to her GERD and hiatal hernia. Recommend MD upgrade diet when stable from SBO standpoint. No further ST recommended.     HPI HPI: Pt is a 57 y/o female admitted secondary to increased SOB and hyponatremia. Pt also reports fall which resulted in R hip pain. Imaging for R hip negative for acute abnormality. CT of head/spine negative for acute abnormality. PMH includes CHF, HTN, COPD on 2L, DM, GERD and osteoporosis.CXR No active cardiopulmonary disease. Order states "pt on chopped meats at [facility] with current plan of care       Recommendations  Diet recommendations: Thin liquid(clears per MD) Liquids provided via: Cup Medication Administration: Whole meds with liquid Supervision: Patient able to self feed Compensations: Slow rate;Small sips/bites Postural Changes and/or Swallow Maneuvers: Seated upright 90 degrees;Upright 30-60 min after meal                Oral Care Recommendations: Oral care BID Follow up Recommendations: None SLP Visit Diagnosis: Dysphagia, unspecified (R13.10) Plan: Continue with current plan of care                       Houston Siren 08/07/2017, 2:47 PM   Orbie Pyo Colvin Caroli[employer] [phone]

## 2017-08-07 NOTE — Progress Notes (Signed)
PROGRESS NOTE    Natalie Thomas  GBT:[id number] DOB: 1961-02-07 DOA: 07/30/2017 PCP: Renato Shin, MD   Chief Complaint  Patient presents with  . Shortness of Breath  . Fall  . Constipation    X 1 WEEK    Brief Narrative:  HPI on 07/31/2017 by Dr. Gerlean Ren Natalie Thomas is a 57 y.o. female with medical history significant of COPD, GERD, depression, hypertension came to the ER with complaints of right hip pain after fall.  Patient states that yesterday afternoon while transferring herself from the wheelchair she ended up sustaining a fall on the right side hurting her right hip.  She denies any loss of consciousness or any other warning signs prior to the fall.  She states it was purely mechanical fall as she lost her balance.  Since then she has had right-sided hip pain.  During this time she also states she has noticed her shortness of breath has worsened and has some coughing but unable to bring up any sputum.  Denies any fevers, chills and other complaints.  She has not had a bowel movement in over a week now but thinks this may be due to poor p.o. appetite.  Interim history Admitted for hyponatremia, COPD exacerbation and right hip pain. Patient now complaining of vomiting. Found to have SBO, general surgery consulted. NG tube has been removed. Assessment & Plan   Acute small bowel obstruction nausea vomiting abdominal pain-CT abdomen pelvis 2/9 high-grade SBO, and per general surgery discontinue NG tube-patient passed stool 2/13 Still having discomfort in abdomen also has hiatal hernia Repeat abdominal x-ray today Appreciate  Leukocytosis Likely multifactorial including small bowel obstruction, steroids WBC trending downward, currently 11---->9.1  Acute COPD exacerbation No active wheezing and no further need for IV steroids so discontinuing given high risk for further fractures with steroids  Hyponatremia Baseline sodium approximately 130 Unknown etiology- possibly  due to antidepressant medications (patient is on Prozac, wellbutrin, latuda) Sodium on admission 114, currently up to 129 Urine electrolytes: Urine sodium 65, urine osmolality 275 was placed on normal saline and sodium tablets On 08/02/2017: discussed with nephrology, Dr. Posey Pronto, recommended discontinuing IVF and increasing sodium tablets to 2tabs TID, small dose of IV Lasix patient restarted on clears, once able to tolerate, may consider restarting sodium tablets Continuing IV Lasix 20 daily as above   Right-sided hip pain secondary to mechanical fall/right superior pubic ramus fracture CT head/cervical spine unremarkable Right hip x-ray showed no definite acute abnormality--CT right hip: acute nondisplaced fracture of the right superior pubic ramus PT/OT consulted-recommended SNF difficult situation as needs pain control-which may contribute to small bowel obstruction  Dr. Maye Hides D/W orthopedics, patient does not necessarily need outpatient follow-up.  Allow 6 weeks to heal. Awaiting skilled placement  Constipation Improved, patient appears to have had 4 bowel movements on 08/05/2017 Discontinue bisacodyl, continueStart MiraLAX and senna scheduled for regular stool  Hypokalemia/hypomagnesemia, hyponatremia, repeat -Resolved, continue to monitor and replace as neded magnesium and labs a.m.  Essential hypertension not controlled -continue IV Lasix, clonidine patch 0.3-7 days -Nifedipine and Coreg held due to n.p.o. Status -placed on IV metoprolol 2.5 mg IV scheduled along with IV hydralazine and labetalol as needed -Will restart home medications going forward if tolerated    Depression/anxiety -Will continue Ativan 0.5 every 4 as needed anxiety-on hold for now are 80 a.m. Wellbutrin 150, Latuda 80 mg every morning 40 mg daily, depakote, Paxil when SBO/ileus has resolved -There is some overlap with these medications, patient  should follow-up with her psychiatrist or psychologist as an  outpatient better medication management -Placed on IV Ativan  Hyperlipidemia -Restart statin  GERD with hiatal hernia -Continue PPI, on IV form -Transition to oral form once able to tolerate liquids  Diabetes mellitus, type II NG tube removed yesterday -Currently on no home medications, continue insulin sliding scale and CBG monitoring  Right ear bleeding -Secondary to patient placing her finger in her ear and picking at it. -appears to have improved -hemoglobin 11.0 -Continue to monitor CBC  DVT Prophylaxis  heparin  Code Status: Full  Family Communication: None at bedside  Disposition Plan: Admitted.  SNF when medically stable, suspected in the next 24-48 hours  Consultants Nephrology, via phone, Dr. Posey Pronto Orthopedic via phone General surgery  Procedures  NG tube placement  Antibiotics   Anti-infectives (From admission, onward)   None      Subjective:  NG tube removed yesterday Had a stool yesterday however also not feeling comfortable after eating amount of diet that she had although clear the tray No nausea no vomiting no fever Having discomfort in abdomen to some degree   Objective:   Vitals:   08/07/17 0600 08/07/17 0800 08/07/17 0901 08/07/17 0902  BP: (!) 181/71 (!) 161/57    Pulse: 61 66    Resp: 18     Temp: 98.8 F (37.1 C)     TempSrc: Oral     SpO2: 95% 100% 98% 98%  Weight: 86.7 kg (191 lb 2.2 oz)     Height:        Intake/Output Summary (Last 24 hours) at 08/07/2017 1023 Last data filed at 08/07/2017 0936 Gross per 24 hour  Intake 1200 ml  Output 2250 ml  Net -1050 ml   Filed Weights   08/05/17 0558 08/06/17 0539 08/07/17 0600  Weight: 88.2 kg (194 lb 7.1 oz) 85.4 kg (188 lb 4.4 oz) 86.7 kg (191 lb 2.2 oz)   Exam Awake alert pleasant oriented without distress however stating some abdominal pain On oxygen 2 L Abdomen soft but a little distended and but slightly tender in epigastrium, no organomegaly S1-S2 no murmur= and  telemetry strips reviewed with first-degree AV block but no other findings and sinus rhythm No lower extremity edema Flat affect with cataract changes to the right eye, mild pallor, no icterus Neurologically intact moving all 4 limbs equally with no deficits deficit  Data Reviewed: I have personally reviewed following labs and imaging studies  CBC: Recent Labs  Lab 08/03/17 0451 08/04/17 0735 08/05/17 0557 08/06/17 0455 08/07/17 0600  WBC 11.6* 14.9* 11.0* 10.1 9.1  HGB 12.9 12.9 11.2* 11.0* 11.3*  HCT 37.1 38.2 33.2* 32.6* 33.2*  MCV 86.3 87.8 88.1 88.3 87.4  PLT 267 274 235 218 401   Basic Metabolic Panel: Recent Labs  Lab 08/01/17 1424  08/03/17 0451 08/04/17 0735 08/05/17 0557 08/06/17 0455 08/07/17 0600  NA 120*   < > 121* 122* 127* 129* 127*  K 4.9   < > 4.4 4.3 4.2 3.7 3.8  CL 89*   < > 85* 81* 84* 86* 84*  CO2 21*   < > 26 28 30 30 31   GLUCOSE 165*   < > 128* 140* 137* 130* 133*  BUN 9   < > 11 17 21* 21* 10  CREATININE 0.64   < > 0.54 0.57 0.57 0.44 0.50  CALCIUM 8.5*   < > 8.9 8.7* 8.7* 8.6* 8.6*  MG 1.9  --   --   --   --   --   --    < > =  values in this interval not displayed.   GFR: Estimated Creatinine Clearance: 86.4 mL/min (by C-G formula based on SCr of 0.5 mg/dL). Liver Function Tests: Recent Labs  Lab 08/04/17 0735  AST 16  ALT 23  ALKPHOS 58  BILITOT 0.5  PROT 6.0*  ALBUMIN 3.0*   No results for input(s): LIPASE, AMYLASE in the last 168 hours. No results for input(s): AMMONIA in the last 168 hours. Coagulation Profile: No results for input(s): INR, PROTIME in the last 168 hours. Cardiac Enzymes: No results for input(s): CKTOTAL, CKMB, CKMBINDEX, TROPONINI in the last 168 hours. BNP (last 3 results) No results for input(s): PROBNP in the last 8760 hours. HbA1C: No results for input(s): HGBA1C in the last 72 hours. CBG: Recent Labs  Lab 08/06/17 0741 08/06/17 1209 08/06/17 1656 08/06/17 2134 08/07/17 0743  GLUCAP 111* 132*  136* 121* 118*   Lipid Profile: No results for input(s): CHOL, HDL, LDLCALC, TRIG, CHOLHDL, LDLDIRECT in the last 72 hours. Thyroid Function Tests: No results for input(s): TSH, T4TOTAL, FREET4, T3FREE, THYROIDAB in the last 72 hours. Anemia Panel: No results for input(s): VITAMINB12, FOLATE, FERRITIN, TIBC, IRON, RETICCTPCT in the last 72 hours. Urine analysis:    Component Value Date/Time   COLORURINE YELLOW 07/31/2017 0114   APPEARANCEUR CLEAR 07/31/2017 0114   APPEARANCEUR Clear 06/22/2012 0618   LABSPEC 1.008 07/31/2017 0114   LABSPEC 1.003 06/22/2012 0618   PHURINE 7.0 07/31/2017 0114   GLUCOSEU 50 (A) 07/31/2017 0114   GLUCOSEU Negative 06/22/2012 0618   GLUCOSEU NEGATIVE 04/09/2011 1058   HGBUR NEGATIVE 07/31/2017 0114   BILIRUBINUR NEGATIVE 07/31/2017 0114   BILIRUBINUR Negative 06/22/2012 0618   KETONESUR 5 (A) 07/31/2017 0114   PROTEINUR NEGATIVE 07/31/2017 0114   UROBILINOGEN 1.0 08/18/2011 1805   NITRITE NEGATIVE 07/31/2017 0114   LEUKOCYTESUR NEGATIVE 07/31/2017 0114   LEUKOCYTESUR Trace 06/22/2012 0618   Sepsis Labs: @LABRCNTIP (procalcitonin:4,lacticidven:4)  ) Recent Results (from the past 240 hour(s))  MRSA PCR Screening     Status: None   Collection Time: 07/31/17  4:35 PM  Result Value Ref Range Status   MRSA by PCR NEGATIVE NEGATIVE Final    Comment:        The GeneXpert MRSA Assay (FDA approved for NASAL specimens only), is one component of a comprehensive MRSA colonization surveillance program. It is not intended to diagnose MRSA infection nor to guide or monitor treatment for MRSA infections. Performed at [facility], [place] [address], [place], [place] [address]       Radiology Studies: No results found.   Scheduled Meds: . aspirin EC  81 mg Oral Daily  . bisacodyl  10 mg Rectal Once  . buPROPion  150 mg Oral Daily  . cloNIDine  0.3 mg Transdermal Weekly  . ferrous sulfate  325 mg Oral Q breakfast  . fluticasone  2 spray Each  Nare BID  . fluticasone furoate-vilanterol  1 puff Inhalation Daily  . furosemide  20 mg Intravenous Daily  . heparin  5,000 Units Subcutaneous Q8H  . insulin aspart  0-5 Units Subcutaneous QHS  . insulin aspart  0-9 Units Subcutaneous TID WC  . ipratropium-albuterol  3 mL Nebulization TID  . lurasidone  80 mg Oral Q breakfast  . methylPREDNISolone (SOLU-MEDROL) injection  40 mg Intravenous Q12H  . metoprolol tartrate  2.5 mg Intravenous Q6H  . pantoprazole (PROTONIX) IV  40 mg Intravenous Q24H  . PARoxetine  40 mg Oral Daily  . pravastatin  20 mg Oral Daily  .  sucralfate  1 g Oral QID   Continuous Infusions: . valproate sodium 500 mg (08/07/17 0928)     LOS: 7 days   time spent 25 min  Verneita Griffes, MD [facility] Office  [phone]

## 2017-08-07 NOTE — Clinical Social Work Note (Addendum)
CSW spoke with patient's legal guardian. He has accepted bed offer from [facility] since they have a contract with [facility]. PASARR still under level 2 review.  Dayton Scrape, CSW [phone]  9:45 am CSW notified patient of the above. She is agreeable.  Dayton Scrape, [place]

## 2017-08-07 NOTE — Progress Notes (Signed)
   Subjective/Chief Complaint: Denies nausea this morning Tolerated clears Had BM Still with some abdominal fullness   Objective: Vital signs in last 24 hours: Temp:  [98.8 F (37.1 C)] 98.8 F (37.1 C) (02/13 0600) Pulse Rate:  [57-67] 61 (02/13 0600) Resp:  [18] 18 (02/13 0600) BP: (162-197)/(59-71) 181/71 (02/13 0600) SpO2:  [93 %-99 %] 95 % (02/13 0600) Weight:  [86.7 kg (191 lb 2.2 oz)] 86.7 kg (191 lb 2.2 oz) (02/13 0600) Last BM Date: 08/06/17  Intake/Output from previous day: 02/12 0701 - 02/13 0700 In: 960 [P.O.:960] Out: 2250 [Urine:2250] Intake/Output this shift: No intake/output data recorded.  Exam: Looks comfortable Abdomen obese, soft, non-distended and non-tender.   Lab Results:  Recent Labs    08/06/17 0455 08/07/17 0600  WBC 10.1 9.1  HGB 11.0* 11.3*  HCT 32.6* 33.2*  PLT 218 230   BMET Recent Labs    08/06/17 0455 08/07/17 0600  NA 129* 127*  K 3.7 3.8  CL 86* 84*  CO2 30 31  GLUCOSE 130* 133*  BUN 21* 10  CREATININE 0.44 0.50  CALCIUM 8.6* 8.6*   PT/INR No results for input(s): LABPROT, INR in the last 72 hours. ABG No results for input(s): PHART, HCO3 in the last 72 hours.  Invalid input(s): PCO2, PO2  Studies/Results: Dg Abd Portable 1v  Result Date: 08/05/2017 CLINICAL DATA:  Small bowel obstruction, nasogastric tube. EXAM: PORTABLE ABDOMEN - 1 VIEW COMPARISON:  08/04/2017. FINDINGS: Slight improvement in gaseous distention of small bowel without resolution. Retained oral contrast in the colon. Nasogastric tube terminates in the stomach with the side port in the region of the gastroesophageal junction. IMPRESSION: Slight improvement in gaseous distension of small bowel, without resolution. Electronically Signed   By: Lorin Picket M.D.   On: 08/05/2017 09:34    Anti-infectives: Anti-infectives (From admission, onward)   None      Assessment/Plan: Ileus vs SBO  Appears to be improving again Will try full liquid  diet  LOS: 7 days    Makara Lanzo A 08/07/2017

## 2017-08-08 LAB — COMPREHENSIVE METABOLIC PANEL
ALK PHOS: 49 U/L (ref 38–126)
ALT: 24 U/L (ref 14–54)
AST: 17 U/L (ref 15–41)
Albumin: 2.6 g/dL — ABNORMAL LOW (ref 3.5–5.0)
Anion gap: 10 (ref 5–15)
BUN: 5 mg/dL — AB (ref 6–20)
CALCIUM: 8.5 mg/dL — AB (ref 8.9–10.3)
CO2: 32 mmol/L (ref 22–32)
CREATININE: 0.43 mg/dL — AB (ref 0.44–1.00)
Chloride: 87 mmol/L — ABNORMAL LOW (ref 101–111)
GFR calc Af Amer: >60 mL/min (ref >60)
Glucose, Bld: 80 mg/dL (ref 65–99)
Potassium: 3.5 mmol/L (ref 3.5–5.1)
Sodium: 129 mmol/L — ABNORMAL LOW (ref 135–145)
Total Bilirubin: 0.4 mg/dL (ref 0.3–1.2)
Total Protein: 5.3 g/dL — ABNORMAL LOW (ref 6.5–8.1)

## 2017-08-08 LAB — CBC WITH DIFFERENTIAL/PLATELET
Basophils Absolute: 0 10*3/uL (ref 0.0–0.1)
Basophils Relative: 0 %
EOS ABS: 0 10*3/uL (ref 0.0–0.7)
EOS PCT: 0 %
HCT: 33.1 % — ABNORMAL LOW (ref 36.0–46.0)
Hemoglobin: 11.3 g/dL — ABNORMAL LOW (ref 12.0–15.0)
LYMPHS ABS: 3.5 10*3/uL (ref 0.7–4.0)
LYMPHS PCT: 36 %
MCH: 30.1 pg (ref 26.0–34.0)
MCHC: 34.1 g/dL (ref 30.0–36.0)
MCV: 88 fL (ref 78.0–100.0)
MONO ABS: 1.1 10*3/uL — AB (ref 0.1–1.0)
Monocytes Relative: 12 %
Neutro Abs: 5 10*3/uL (ref 1.7–7.7)
Neutrophils Relative %: 52 %
PLATELETS: 215 10*3/uL (ref 150–400)
RBC: 3.76 MIL/uL — ABNORMAL LOW (ref 3.87–5.11)
RDW: 13.5 % (ref 11.5–15.5)
WBC: 9.6 10*3/uL (ref 4.0–10.5)

## 2017-08-08 LAB — MAGNESIUM: MAGNESIUM: 1.8 mg/dL (ref 1.7–2.4)

## 2017-08-08 LAB — GLUCOSE, CAPILLARY
Glucose-Capillary: 109 mg/dL — ABNORMAL HIGH (ref 65–99)
Glucose-Capillary: 77 mg/dL (ref 65–99)
Glucose-Capillary: 95 mg/dL (ref 65–99)
Glucose-Capillary: 98 mg/dL (ref 65–99)
Glucose-Capillary: 98 mg/dL (ref 65–99)

## 2017-08-08 MED ORDER — NIFEDIPINE ER OSMOTIC RELEASE 90 MG PO TB24
90.0000 mg | ORAL_TABLET | Freq: Every day | ORAL | Status: DC
  Administered 2017-08-08 – 2017-08-12 (×5): 90 mg via ORAL
  Filled 2017-08-08 (×5): qty 1

## 2017-08-08 MED ORDER — OXYCODONE-ACETAMINOPHEN 5-325 MG PO TABS
1.0000 | ORAL_TABLET | ORAL | Status: DC | PRN
  Administered 2017-08-08 – 2017-08-12 (×10): 1 via ORAL
  Filled 2017-08-08 (×11): qty 1

## 2017-08-08 MED ORDER — SODIUM CHLORIDE 1 G PO TABS
1.0000 g | ORAL_TABLET | Freq: Two times a day (BID) | ORAL | Status: DC
  Administered 2017-08-08 – 2017-08-09 (×2): 1 g via ORAL
  Filled 2017-08-08 (×3): qty 1

## 2017-08-08 MED ORDER — HYDRALAZINE HCL 50 MG PO TABS
100.0000 mg | ORAL_TABLET | Freq: Three times a day (TID) | ORAL | Status: DC
  Administered 2017-08-08 – 2017-08-12 (×12): 100 mg via ORAL
  Filled 2017-08-08 (×12): qty 2

## 2017-08-08 MED ORDER — BISACODYL 10 MG RE SUPP
10.0000 mg | Freq: Once | RECTAL | Status: AC
  Administered 2017-08-08: 10 mg via RECTAL
  Filled 2017-08-08: qty 1

## 2017-08-08 MED ORDER — CARVEDILOL 25 MG PO TABS
25.0000 mg | ORAL_TABLET | Freq: Two times a day (BID) | ORAL | Status: DC
Start: 2017-08-08 — End: 2017-08-12
  Administered 2017-08-08 – 2017-08-12 (×8): 25 mg via ORAL
  Filled 2017-08-08 (×8): qty 1

## 2017-08-08 MED ORDER — METOCLOPRAMIDE HCL 5 MG/ML IJ SOLN
5.0000 mg | Freq: Three times a day (TID) | INTRAMUSCULAR | Status: AC
  Administered 2017-08-08 (×3): 5 mg via INTRAVENOUS
  Filled 2017-08-08 (×3): qty 2

## 2017-08-08 NOTE — Progress Notes (Signed)
PROGRESS NOTE    Natalie Thomas  KWI:[id number] DOB: 1960/08/04 DOA: 07/30/2017 PCP: Renato Shin, MD   Chief Complaint  Patient presents with  . Shortness of Breath  . Fall  . Constipation    X 1 WEEK    Brief Narrative:  HPI on 07/31/2017 by Dr. Gerlean Ren Natalie Thomas is a 57 y.o. female with medical history significant of COPD, GERD, depression, hypertension came to the ER with complaints of right hip pain after fall.  Patient states that yesterday afternoon while transferring herself from the wheelchair she ended up sustaining a fall on the right side hurting her right hip.  She denies any loss of consciousness or any other warning signs prior to the fall.  She states it was purely mechanical fall as she lost her balance.  Since then she has had right-sided hip pain.  During this time she also states she has noticed her shortness of breath has worsened and has some coughing but unable to bring up any sputum.  Denies any fevers, chills and other complaints.  She has not had a bowel movement in over a week now but thinks this may be due to poor p.o. appetite.  Interim history Admitted for hyponatremia, COPD exacerbation and right hip pain. Patient now complaining of vomiting. Found to have SBO, general surgery consulted. NG tube has been removed. Assessment & Plan   Acute small bowel obstruction nausea vomiting abdominal pain-CT abdomen pelvis 2/9 high-grade SBO vs Ileus, and per general surgery discontinue NG tube-patient passed stool 2/13 Still having discomfort in abdomen also has hiatal hernia Repeat abdominal x-ray 2/13 reveals persisitent findings Diet as per Gen surgery has been advanced to full liquids and we will continue to monitor  Leukocytosis Likely multifactorial including small bowel obstruction, steroids WBC trending downward, currently 11---->9.1->9.6  Acute COPD exacerbation No active wheezing and no further need for IV steroids so discontinuing given high  risk for further fractures with steroids  Hyponatremia Baseline sodium approximately 130 Unknown etiology- possibly due to antidepressant medications (patient is on Prozac, wellbutrin, latuda) Sodium on admission 114, currently up to 129 Urine electrolytes: Urine sodium 65, urine osmolality 275 was placed on normal saline and sodium tablets On 08/02/2017: discussed with nephrology, Dr. Posey Pronto, recommended discontinuing IVF and increasing sodium tablets to 2tabs TID, small dose of IV Lasix Now taking p.o. so start back on salt tablets 1 g 3 times daily 2/14 Continuing IV Lasix 20 daily as above  Right-sided hip pain secondary to mechanical fall/right superior pubic ramus fracture CT head/cervical spine unremarkable Right hip x-ray showed no definite acute abnormality--CT right hip: acute nondisplaced fracture of the right superior pubic ramus PT/OT consulted-recommended SNF difficult situation as needs pain control-which may contribute to small bowel obstruction  Dr. Maye Hides D/W orthopedics, patient does not necessarily need outpatient follow-up.  Allow 6 weeks to heal-discussed with patient on 2/14 and have discontinued IV morphine every 4 as needed in place of Percocet oral tablets to see if tolerated and as this may also help with GI motility Awaiting skilled placement once everything resolves  Constipation Discontinue bisacodyl, continueStart MiraLAX and senna scheduled for regular stool  Hypokalemia/hypomagnesemia, hyponatremia, repeat -Resolved, continue to monitor and replace as neded magnesium  Mag checked and 1.8 --labs periodically  Essential hypertension not controlled continue IV Lasix, clonidine patch 0.3- q 7  restarted on 2/14 Coreg 25 twice daily, hydralazine 100 every 8, nifedipine 90 daily placed on IV metoprolol 2.5 mg IV scheduled along  with IV hydralazine and labetalol as needed  Depression/anxiety Will continue Ativan 0.5 every 4 as needed anxiety-on hold for now are  80 a.m. Wellbutrin 150, Latuda 80 mg every morning 40 mg daily, depakote, Paxil when SBO/ileus has resolved There is some overlap with these medications, patient should follow-up with her psychiatrist or psychologist as an outpatient better medication management Placed on IV Ativan  Hyperlipidemia Restart statin  GERD with hiatal hernia Continue PPI, on IV form Transition 2/13 back to oral PPI  Diabetes mellitus, type II NG tube removed yesterday Currently on no home medications, continue insulin sliding scale and CBG monitoring-sugars have been in the 77-98 range  Right ear bleeding Secondary to patient placing her finger in her ear and picking at it. appears to have improved hemoglobin 11.0 Continue to monitor CBC  DVT Prophylaxis  heparin  Code Status: Full  Family Communication: None at bedside  Disposition Plan: Admitted.  SNF when medically stable--not yet ready as not eating a diet  Consultants Nephrology, via phone, Dr. Posey Pronto Orthopedic via phone General surgery  Procedures  NG tube placement  Antibiotics   Anti-infectives (From admission, onward)   None      Subjective:   Some nausea and discomfort in the abdomen the past 24 hours but tolerating full liquids including some grades Many questions about SBO and explained the same to her No chest pain Fever No chills No lower extremity edema Tells me he has used oxygen for long time EOMI NCAT flat affect  Objective:   Vitals:   08/08/17 0008 08/08/17 0436 08/08/17 0800 08/08/17 0855  BP: (!) 170/62 (!) 172/70 (!) 171/57   Pulse: 60 66 (!) 59   Resp:  18    Temp:  98.2 F (36.8 C)    TempSrc:  Oral    SpO2:  100%  98%  Weight:  86.4 kg (190 lb 7.6 oz)    Height:        Intake/Output Summary (Last 24 hours) at 08/08/2017 0925 Last data filed at 08/08/2017 0008 Gross per 24 hour  Intake 1815 ml  Output 3100 ml  Net -1285 ml   Filed Weights   08/06/17 0539 08/07/17 0600 08/08/17 0436  Weight:  85.4 kg (188 lb 4.4 oz) 86.7 kg (191 lb 2.2 oz) 86.4 kg (190 lb 7.6 oz)   Exam Poor dentition, flat affect On nasal oxygen S1-S2 no murmur rub or gallop telemetry shows that she is not on monitors Abdomen is soft slightly tender in upper epigastric area no rebound bowel sounds are minimally heard No lower extremity edema  neurologically is intact without deficit  Data Reviewed: I have personally reviewed following labs and imaging studies  CBC: Recent Labs  Lab 08/04/17 0735 08/05/17 0557 08/06/17 0455 08/07/17 0600 08/08/17 0635  WBC 14.9* 11.0* 10.1 9.1 9.6  NEUTROABS  --   --   --   --  5.0  HGB 12.9 11.2* 11.0* 11.3* 11.3*  HCT 38.2 33.2* 32.6* 33.2* 33.1*  MCV 87.8 88.1 88.3 87.4 88.0  PLT 274 235 218 230 785   Basic Metabolic Panel: Recent Labs  Lab 08/01/17 1424  08/04/17 0735 08/05/17 0557 08/06/17 0455 08/07/17 0600 08/08/17 0635  NA 120*   < > 122* 127* 129* 127* 129*  K 4.9   < > 4.3 4.2 3.7 3.8 3.5  CL 89*   < > 81* 84* 86* 84* 87*  CO2 21*   < > 28 30 30 31  32  GLUCOSE  165*   < > 140* 137* 130* 133* 80  BUN 9   < > 17 21* 21* 10 5*  CREATININE 0.64   < > 0.57 0.57 0.44 0.50 0.43*  CALCIUM 8.5*   < > 8.7* 8.7* 8.6* 8.6* 8.5*  MG 1.9  --   --   --   --   --  1.8   < > = values in this interval not displayed.   GFR: Estimated Creatinine Clearance: 86.3 mL/min (A) (by C-G formula based on SCr of 0.43 mg/dL (L)). Liver Function Tests: Recent Labs  Lab 08/04/17 0735 08/08/17 0635  AST 16 17  ALT 23 24  ALKPHOS 58 49  BILITOT 0.5 0.4  PROT 6.0* 5.3*  ALBUMIN 3.0* 2.6*   No results for input(s): LIPASE, AMYLASE in the last 168 hours. No results for input(s): AMMONIA in the last 168 hours. Coagulation Profile: No results for input(s): INR, PROTIME in the last 168 hours. Cardiac Enzymes: No results for input(s): CKTOTAL, CKMB, CKMBINDEX, TROPONINI in the last 168 hours. BNP (last 3 results) No results for input(s): PROBNP in the last 8760  hours. HbA1C: No results for input(s): HGBA1C in the last 72 hours. CBG: Recent Labs  Lab 08/07/17 0743 08/07/17 1129 08/07/17 1657 08/08/17 0011 08/08/17 0738  GLUCAP 118* 192* 183* 98 77   Lipid Profile: No results for input(s): CHOL, HDL, LDLCALC, TRIG, CHOLHDL, LDLDIRECT in the last 72 hours. Thyroid Function Tests: No results for input(s): TSH, T4TOTAL, FREET4, T3FREE, THYROIDAB in the last 72 hours. Anemia Panel: No results for input(s): VITAMINB12, FOLATE, FERRITIN, TIBC, IRON, RETICCTPCT in the last 72 hours. Urine analysis:    Component Value Date/Time   COLORURINE YELLOW 07/31/2017 0114   APPEARANCEUR CLEAR 07/31/2017 0114   APPEARANCEUR Clear 06/22/2012 0618   LABSPEC 1.008 07/31/2017 0114   LABSPEC 1.003 06/22/2012 0618   PHURINE 7.0 07/31/2017 0114   GLUCOSEU 50 (A) 07/31/2017 0114   GLUCOSEU Negative 06/22/2012 0618   GLUCOSEU NEGATIVE 04/09/2011 1058   HGBUR NEGATIVE 07/31/2017 0114   BILIRUBINUR NEGATIVE 07/31/2017 0114   BILIRUBINUR Negative 06/22/2012 0618   KETONESUR 5 (A) 07/31/2017 0114   PROTEINUR NEGATIVE 07/31/2017 0114   UROBILINOGEN 1.0 08/18/2011 1805   NITRITE NEGATIVE 07/31/2017 0114   LEUKOCYTESUR NEGATIVE 07/31/2017 0114   LEUKOCYTESUR Trace 06/22/2012 0618   Sepsis Labs: @LABRCNTIP (procalcitonin:4,lacticidven:4)  ) Recent Results (from the past 240 hour(s))  MRSA PCR Screening     Status: None   Collection Time: 07/31/17  4:35 PM  Result Value Ref Range Status   MRSA by PCR NEGATIVE NEGATIVE Final    Comment:        The GeneXpert MRSA Assay (FDA approved for NASAL specimens only), is one component of a comprehensive MRSA colonization surveillance program. It is not intended to diagnose MRSA infection nor to guide or monitor treatment for MRSA infections. Performed at [facility], [place] [address] [place], [place] [address]       Radiology Studies: Dg Abd Portable 2v  Result Date: 08/07/2017 CLINICAL DATA:   Follow-up small bowel obstruction. EXAM: PORTABLE ABDOMEN - 2 VIEW COMPARISON:  Abdominal radiographs of August 05, 2017 FINDINGS: There are loops of mildly distended gas-filled small bowel stacked in the mid abdomen. There is contrast and gas within normal caliber colonic loops. A small amount of gas is present in the rectosigmoid. There surgical clips in the gallbladder fossa. There are degenerative changes of the lumbar spine with a compression fracture of  L2. The patient has undergone sclerosing therapy for pelvic fractures. IMPRESSION: Persistent partial distal small bowel obstruction. No evidence of perforation. Electronically Signed   By: David  Martinique M.D.   On: 08/07/2017 12:36     Scheduled Meds: . aspirin EC  81 mg Oral Daily  . bisacodyl  10 mg Rectal Once  . buPROPion  150 mg Oral Daily  . cloNIDine  0.3 mg Transdermal Weekly  . ferrous sulfate  325 mg Oral Q breakfast  . fluticasone  2 spray Each Nare BID  . fluticasone furoate-vilanterol  1 puff Inhalation Daily  . furosemide  20 mg Intravenous Daily  . heparin  5,000 Units Subcutaneous Q8H  . insulin aspart  0-5 Units Subcutaneous QHS  . insulin aspart  0-9 Units Subcutaneous TID WC  . ipratropium-albuterol  3 mL Nebulization TID  . lurasidone  80 mg Oral Q breakfast  . metoCLOPramide (REGLAN) injection  5 mg Intravenous Q8H  . metoprolol tartrate  2.5 mg Intravenous Q6H  . pantoprazole  40 mg Oral QHS  . PARoxetine  40 mg Oral Daily  . polyethylene glycol  17 g Oral Daily  . pravastatin  20 mg Oral Daily  . senna  1 tablet Oral Daily  . sucralfate  1 g Oral QID   Continuous Infusions: . valproate sodium Stopped (08/08/17 0001)     LOS: 8 days   time spent 25 min  Verneita Griffes, MD [facility] [place] [facility] Office  [phone]

## 2017-08-08 NOTE — Progress Notes (Signed)
   Subjective/Chief Complaint: She reports that she "still doesn't feel good" with oral intake. Denies pain or nausea this morning Had small BM. Still not ambulating or getting up much   Objective: Vital signs in last 24 hours: Temp:  [97.8 F (36.6 C)-98.2 F (36.8 C)] 98.2 F (36.8 C) (02/14 0436) Pulse Rate:  [60-66] 66 (02/14 0436) Resp:  [18] 18 (02/14 0436) BP: (110-172)/(55-70) 172/70 (02/14 0436) SpO2:  [98 %-100 %] 100 % (02/14 0436) Weight:  [86.4 kg (190 lb 7.6 oz)] 86.4 kg (190 lb 7.6 oz) (02/14 0436) Last BM Date: 08/07/17  Intake/Output from previous day: 02/13 0701 - 02/14 0700 In: 1815 [P.O.:1760; IV Piggyback:55] Out: 9678 [Urine:3100] Intake/Output this shift: No intake/output data recorded.  Exam: Abdomen is soft, Non-tender, obese  Lab Results:  Recent Labs    08/07/17 0600 08/08/17 0635  WBC 9.1 9.6  HGB 11.3* 11.3*  HCT 33.2* 33.1*  PLT 230 215   BMET Recent Labs    08/07/17 0600 08/08/17 0635  NA 127* 129*  K 3.8 3.5  CL 84* 87*  CO2 31 32  GLUCOSE 133* 80  BUN 10 5*  CREATININE 0.50 0.43*  CALCIUM 8.6* 8.5*   PT/INR No results for input(s): LABPROT, INR in the last 72 hours. ABG No results for input(s): PHART, HCO3 in the last 72 hours.  Invalid input(s): PCO2, PO2  Studies/Results: Dg Abd Portable 2v  Result Date: 08/07/2017 CLINICAL DATA:  Follow-up small bowel obstruction. EXAM: PORTABLE ABDOMEN - 2 VIEW COMPARISON:  Abdominal radiographs of August 05, 2017 FINDINGS: There are loops of mildly distended gas-filled small bowel stacked in the mid abdomen. There is contrast and gas within normal caliber colonic loops. A small amount of gas is present in the rectosigmoid. There surgical clips in the gallbladder fossa. There are degenerative changes of the lumbar spine with a compression fracture of L2. The patient has undergone sclerosing therapy for pelvic fractures. IMPRESSION: Persistent partial distal small bowel  obstruction. No evidence of perforation. Electronically Signed   By: David  Martinique M.D.   On: 08/07/2017 12:36    Anti-infectives: Anti-infectives (From admission, onward)   None      Assessment/Plan: Ileus vs SBO  I still think this is more of an ileus given her immobility than a partial SBO. Will keep on full liquids. She needs to be more active if possible  LOS: 8 days    Zikeria Keough A 08/08/2017

## 2017-08-08 NOTE — Progress Notes (Signed)
PT Cancellation Note  Patient Details Name: Natalie Thomas MRN: [id number] DOB: [birth year]   Cancelled Treatment:    Reason Eval/Treat Not Completed: (P) Pain limiting ability to participate(Pt reports abdominal discomfort, wants to eat lunch and perform therapy .later this pm.  )   Darletta Noblett Eli Hose 08/08/2017, 1:43 PM  Governor Rooks, PTA pager [phone]

## 2017-08-08 NOTE — Progress Notes (Signed)
Physical Therapy Treatment Patient Details Name: Natalie Thomas MRN: [id number] DOB: [birth year] Today's Date: 08/08/2017    History of Present Illness Pt is a 57 y/o female admitted 07/30/17 secondary to increased SOB and hyponatremia; pt also reports fall which resulted in R hip pain. CT of head/spine negative for acute abnormality. CT of R hip shows acute nondisplaced fx of right superior pubic ramus. PMH includes CHF, HTN, COPD on 2L, DM, and osteoporosis.     PT Comments    Pt performed increased activity and increased gait.  Pt remains to require min assistance for gait and transfers at this time due to strength and coordination deficits.  Plan for Short term SNF remains appropriate at this time.  During transfer to commode patient with episode of bowel incontinence.      Follow Up Recommendations  SNF;Supervision/Assistance - 24 hour     Equipment Recommendations  None recommended by PT    Recommendations for Other Services       Precautions / Restrictions Precautions Precautions: Fall Precaution Comments: Pt reports fall from [facility] after not locking her brakes on her WC.  Restrictions Weight Bearing Restrictions: No    Mobility  Bed Mobility Overal bed mobility: Needs Assistance Bed Mobility: Supine to Sit Rolling: Min guard         General bed mobility comments: Pt able to advance LEs to edge of bed and elevate trunk into sitting with HOB elevated. Cues to scoot forward toward EOB to prepare for transfer OOB.   Transfers Overall transfer level: Needs assistance Equipment used: Rolling walker (2 wheeled) Transfers: Sit to/from Stand Sit to Stand: Min assist         General transfer comment: Cues for hand placement to and from seated surface.  Pt with poor eccentric loading when returning to seated surface and therefore required increased assistance.    Ambulation/Gait Ambulation/Gait assistance: Min assist Ambulation Distance (Feet): 120 Feet Assistive  device: Rolling walker (2 wheeled) Gait Pattern/deviations: Step-to pattern;Decreased stride length;Wide base of support;Antalgic   Gait velocity interpretation: Below normal speed for age/gender General Gait Details: Cues for RW safety and sequencing to reduce pain in RLE.  Cues for upper trunk control and forward gaze to improve posture.  LOB noted x1.     Stairs            Wheelchair Mobility    Modified Rankin (Stroke Patients Only)       Balance Overall balance assessment: Needs assistance   Sitting balance-Leahy Scale: Good       Standing balance-Leahy Scale: Poor                              Cognition Arousal/Alertness: Awake/alert Behavior During Therapy: WFL for tasks assessed/performed Overall Cognitive Status: Within Functional Limits for tasks assessed                                        Exercises      General Comments        Pertinent Vitals/Pain Pain Assessment: 0-10 Pain Score: 3  Pain Location: R hip pain  Pain Descriptors / Indicators: Sharp Pain Intervention(s): Monitored during session;Repositioned    Home Living                      Prior Function  PT Goals (current goals can now be found in the care plan section) Acute Rehab PT Goals Patient Stated Goal: to decrease pain  Potential to Achieve Goals: Fair Progress towards PT goals: Progressing toward goals    Frequency    Min 2X/week      PT Plan Current plan remains appropriate    Co-evaluation              AM-PAC PT "6 Clicks" Daily Activity  Outcome Measure  Difficulty turning over in bed (including adjusting bedclothes, sheets and blankets)?: Unable Difficulty moving from lying on back to sitting on the side of the bed? : Unable Difficulty sitting down on and standing up from a chair with arms (e.g., wheelchair, bedside commode, etc,.)?: Unable Help needed moving to and from a bed to chair (including a  wheelchair)?: A Little Help needed walking in hospital room?: A Little Help needed climbing 3-5 steps with a railing? : A Little 6 Click Score: 12    End of Session Equipment Utilized During Treatment: Gait belt Activity Tolerance: Patient limited by pain Patient left: with call bell/phone within reach;in chair;with chair alarm set Nurse Communication: Mobility status PT Visit Diagnosis: Other abnormalities of gait and mobility (R26.89);Muscle weakness (generalized) (M62.81);Pain Pain - Right/Left: Right Pain - part of body: Hip     Time: 8453-6468 PT Time Calculation (min) (ACUTE ONLY): 24 min  Charges:  $Gait Training: 8-22 mins $Therapeutic Activity: 8-22 mins                    G Codes:       Governor Rooks, PTA pager [phone]    Cristela Blue 08/08/2017, 5:46 PM

## 2017-08-09 ENCOUNTER — Inpatient Hospital Stay (HOSPITAL_COMMUNITY): Payer: Medicare Other

## 2017-08-09 LAB — CBC WITH DIFFERENTIAL/PLATELET
BASOS ABS: 0 10*3/uL (ref 0.0–0.1)
BASOS PCT: 0 %
EOS ABS: 0.1 10*3/uL (ref 0.0–0.7)
EOS PCT: 1 %
HEMATOCRIT: 33.5 % — AB (ref 36.0–46.0)
Hemoglobin: 11.3 g/dL — ABNORMAL LOW (ref 12.0–15.0)
Lymphocytes Relative: 33 %
Lymphs Abs: 3.6 10*3/uL (ref 0.7–4.0)
MCH: 30.2 pg (ref 26.0–34.0)
MCHC: 33.7 g/dL (ref 30.0–36.0)
MCV: 89.6 fL (ref 78.0–100.0)
MONOS PCT: 10 %
Monocytes Absolute: 1.1 10*3/uL — ABNORMAL HIGH (ref 0.1–1.0)
NEUTROS ABS: 6.3 10*3/uL (ref 1.7–7.7)
Neutrophils Relative %: 56 %
PLATELETS: 240 10*3/uL (ref 150–400)
RBC: 3.74 MIL/uL — ABNORMAL LOW (ref 3.87–5.11)
RDW: 14.1 % (ref 11.5–15.5)
WBC: 11.1 10*3/uL — ABNORMAL HIGH (ref 4.0–10.5)

## 2017-08-09 LAB — GLUCOSE, CAPILLARY
GLUCOSE-CAPILLARY: 133 mg/dL — AB (ref 65–99)
Glucose-Capillary: 73 mg/dL (ref 65–99)
Glucose-Capillary: 122 mg/dL — ABNORMAL HIGH (ref 65–99)
Glucose-Capillary: 97 mg/dL (ref 65–99)

## 2017-08-09 LAB — BASIC METABOLIC PANEL
ANION GAP: 11 (ref 5–15)
BUN: 9 mg/dL (ref 6–20)
CALCIUM: 8.4 mg/dL — AB (ref 8.9–10.3)
CO2: 29 mmol/L (ref 22–32)
CREATININE: 0.51 mg/dL (ref 0.44–1.00)
Chloride: 85 mmol/L — ABNORMAL LOW (ref 101–111)
Glucose, Bld: 77 mg/dL (ref 65–99)
Potassium: 3.5 mmol/L (ref 3.5–5.1)
SODIUM: 125 mmol/L — AB (ref 135–145)

## 2017-08-09 LAB — MAGNESIUM: MAGNESIUM: 1.7 mg/dL (ref 1.7–2.4)

## 2017-08-09 MED ORDER — MAGNESIUM OXIDE 400 (241.3 MG) MG PO TABS
800.0000 mg | ORAL_TABLET | Freq: Two times a day (BID) | ORAL | Status: DC
  Administered 2017-08-09 – 2017-08-12 (×7): 800 mg via ORAL
  Filled 2017-08-09 (×7): qty 2

## 2017-08-09 MED ORDER — SODIUM CHLORIDE 1 G PO TABS
2.0000 g | ORAL_TABLET | Freq: Three times a day (TID) | ORAL | Status: DC
  Administered 2017-08-09 – 2017-08-12 (×8): 2 g via ORAL
  Filled 2017-08-09 (×10): qty 2

## 2017-08-09 MED ORDER — DIVALPROEX SODIUM 250 MG PO DR TAB
250.0000 mg | DELAYED_RELEASE_TABLET | Freq: Two times a day (BID) | ORAL | Status: DC
  Administered 2017-08-09 – 2017-08-12 (×6): 250 mg via ORAL
  Filled 2017-08-09 (×6): qty 1

## 2017-08-09 NOTE — Progress Notes (Signed)
[facility] Progress Note     Subjective: CC- pain Patient states that she continues to have some abdominal pain. More of her pain is in her hip. Denies n/v. Tolerating full liquids. She had 2 loose BMs yesterday. Passing flatus.  Objective: Vital signs in last 24 hours: Temp:  [97.7 F (36.5 C)-98.6 F (37 C)] 97.9 F (36.6 C) (02/15 0454) Pulse Rate:  [55-70] 62 (02/15 0454) Resp:  [16-18] 18 (02/15 0454) BP: (116-151)/(54-70) 132/54 (02/15 0454) SpO2:  [96 %-99 %] 99 % (02/15 0454) Weight:  [84.9 kg (187 lb 3.2 oz)] 84.9 kg (187 lb 3.2 oz) (02/15 0314) Last BM Date: 08/08/17  Intake/Output from previous day: 02/14 0701 - 02/15 0700 In: 2415 [P.O.:2360; IV Piggyback:55] Out: 1400 [GURKY:7062] Intake/Output this shift: No intake/output data recorded.  PE: Gen:  Alert, NAD, pleasant HEENT: EOM's intact, pupils equal and round Card:  RRR, no M/G/R heard Pulm:  effort normal Abd: obese,soft, ND, +BS, no HSM, no hernia, nontender Skin: no rashes noted, warm and dry  Lab Results:  Recent Labs    08/08/17 0635 08/09/17 0549  WBC 9.6 11.1*  HGB 11.3* 11.3*  HCT 33.1* 33.5*  PLT 215 240   BMET Recent Labs    08/08/17 0635 08/09/17 0549  NA 129* 125*  K 3.5 3.5  CL 87* 85*  CO2 32 29  GLUCOSE 80 77  BUN 5* 9  CREATININE 0.43* 0.51  CALCIUM 8.5* 8.4*   PT/INR No results for input(s): LABPROT, INR in the last 72 hours. CMP     Component Value Date/Time   NA 125 (L) 08/09/2017 0549   NA 128 (L) 06/23/2012 0536   K 3.5 08/09/2017 0549   K 4.3 06/23/2012 0536   CL 85 (L) 08/09/2017 0549   CL 92 (L) 06/23/2012 0536   CO2 29 08/09/2017 0549   CO2 28 06/23/2012 0536   GLUCOSE 77 08/09/2017 0549   GLUCOSE 237 (H) 06/23/2012 0536   BUN 9 08/09/2017 0549   BUN 4 (L) 06/23/2012 0536   CREATININE 0.51 08/09/2017 0549   CREATININE 0.42 (L) 06/23/2012 0536   CALCIUM 8.4 (L) 08/09/2017 0549   CALCIUM 9.1 06/23/2012 0536   CALCIUM 9.3 04/09/2011  1058   PROT 5.3 (L) 08/08/2017 0635   PROT 6.9 06/22/2012 0014   ALBUMIN 2.6 (L) 08/08/2017 0635   ALBUMIN 2.8 (L) 06/22/2012 0014   AST 17 08/08/2017 0635   AST 10 (L) 06/22/2012 0014   ALT 24 08/08/2017 0635   ALT 11 (L) 06/22/2012 0014   ALKPHOS 49 08/08/2017 0635   ALKPHOS 81 06/22/2012 0014   BILITOT 0.4 08/08/2017 0635   BILITOT 0.2 06/22/2012 0014   GFRNONAA >60 08/09/2017 0549   GFRNONAA >60 06/23/2012 0536   GFRAA >60 08/09/2017 0549   GFRAA >60 06/23/2012 0536   Lipase     Component Value Date/Time   LIPASE 24 07/25/2017 0149       Studies/Results: Dg Abd Portable 2v  Result Date: 08/07/2017 CLINICAL DATA:  Follow-up small bowel obstruction. EXAM: PORTABLE ABDOMEN - 2 VIEW COMPARISON:  Abdominal radiographs of August 05, 2017 FINDINGS: There are loops of mildly distended gas-filled small bowel stacked in the mid abdomen. There is contrast and gas within normal caliber colonic loops. A small amount of gas is present in the rectosigmoid. There surgical clips in the gallbladder fossa. There are degenerative changes of the lumbar spine with a compression fracture of L2. The patient has undergone sclerosing therapy for  pelvic fractures. IMPRESSION: Persistent partial distal small bowel obstruction. No evidence of perforation. Electronically Signed   By: David  Martinique M.D.   On: 08/07/2017 12:36    Anti-infectives: Anti-infectives (From admission, onward)   None       Assessment/Plan COPD Hyponatremia Right superior pubic ramus fx 2/2 fall 2/5 HTN Depression/anxiety HLD GERD DM  Ileus vs pSBO - CT 2/9 showed high-grade small bowel obstruction with transition point within the lower right abdomen  ID - none FEN - dysphagia 1 diet VTE - heparin Foley - wick Follow up - TBD  Plan - Xray pending this AM. Patient is tolerating FLD and had 2 BMs yesterday. Advance to dysphagia 1 diet. Continue PT/mobilization as able.    LOS: 9 days    Wellington Hampshire ,  [facility] 08/09/2017, 8:16 AM Pager: [phone] Consults: [phone] Mon-Fri 7:00 am-4:30 pm Sat-Sun 7:00 am-11:30 am

## 2017-08-09 NOTE — Progress Notes (Signed)
PROGRESS NOTE    Natalie Thomas  WEX:[id number] DOB: [birth year] DOA: 07/30/2017 PCP: Renato Shin, MD   Chief Complaint  Patient presents with  . Shortness of Breath  . Fall  . Constipation    X 1 WEEK    Brief Narrative:  HPI on 07/31/2017 by Dr. Gerlean Ren Natalie Thomas is a 57 y.o. female with medical history significant of COPD, GERD, depression, hypertension came to the ER with complaints of right hip pain after fall.  Patient states that yesterday afternoon while transferring herself from the wheelchair she ended up sustaining a fall on the right side hurting her right hip.  She denies any loss of consciousness or any other warning signs prior to the fall.  She states it was purely mechanical fall as she lost her balance.  Since then she has had right-sided hip pain.  During this time she also states she has noticed her shortness of breath has worsened and has some coughing but unable to bring up any sputum.  Denies any fevers, chills and other complaints.  She has not had a bowel movement in over a week now but thinks this may be due to poor p.o. appetite.  Interim history Admitted for hyponatremia, COPD exacerbation and right hip pain. Patient now complaining of vomiting. Found to have SBO, general surgery consulted. NG tube has been removed. Assessment & Plan   Acute small bowel obstruction nausea vomiting abdominal pain-CT abdomen pelvis 2/9 high-grade SBO vs Ileus, and per general surgery discontinue NG tube-patient passed stool 2/13 Still having discomfort in abdomen also has hiatal hernia Repeat abdominal x-ray 2/13 reveals persisitent findings Diet advanced to dys 1 diet  Leukocytosis Likely multifactorial including small bowel obstruction, steroids WBC trending downward, currently 11---->9.1->9.6  Acute COPD exacerbation No active wheezing and no further need for IV steroids so discontinuing given high risk for further fractures with  steroids  Hyponatremia Baseline sodium approximately 130 Unknown etiology- possibly due to antidepressant medications (patient is on Prozac, wellbutrin, latuda) Sodium on admission 114, currently up to 129 Urine electrolytes: Urine sodium 65, urine osmolality 275 was placed on normal saline and sodium tablets On 08/02/2017: discussed with nephrology, Dr. Posey Pronto, recommended discontinuing IVF and increasing sodium tablets to 2 tabs TID, small dose of IV Lasix ?  salt tablets 1 g bnid--2 tabs iii 2/15 Continuing IV Lasix 20 daily as above  Right-sided hip pain secondary to mechanical fall/right superior pubic ramus fracture CT head/cervical spine unremarkable Right hip x-ray showed no definite acute abnormality--CT right hip: acute nondisplaced fracture of the right superior pubic ramus PT/OT consulted-recommended SNF difficult situation as needs pain control-which may contribute to small bowel obstruction  Dr. Maye Hides D/W orthopedics, patient does not necessarily need outpatient follow-up.  Allow 6 weeks to heal-discussed with patient on 2/14 and have discontinued IV morphine every 4 as needed in place of Percocet oral tablets to see if tolerated and as this may also help with GI motility Awaiting skilled placement once everything resolves  Constipation Discontinue bisacodyl, continue Start MiraLAX and senna scheduled for regular stool  Hypokalemia/hypomagnesemia, hyponatremia, repeat -Resolved, continue to monitor and replace as neded magnesium  Mag checked and 1.8--1.7, so will add mag ox 800 bid  Essential hypertension not controlled continue IV Lasix, clonidine patch 0.3- q 7  restarted on 2/14 Coreg 25 twice daily, hydralazine 100 every 8, nifedipine 90 daily placed on IV metoprolol 2.5 mg IV scheduled along with IV hydralazine and labetalol as needed  Depression/anxiety  Will continue Ativan 0.5 every 4 as needed anxiety-on hold for now are 80 a.m. Wellbutrin 150, Latuda 80 mg every  morning 40 mg daily, depakote, Paxil when SBO/ileus has resolved There is some overlap with these medications, patient should follow-up with her psychiatrist or psychologist as an outpatient better medication management Placed on IV Ativan  Hyperlipidemia Restart statin  GERD with hiatal hernia Continue PPI, on IV form Transition 2/13 back to oral PPI  Diabetes mellitus, type II NG tube removed yesterday Currently on no home medications, continue insulin sliding scale and CBG monitoring-sugars have been in the 77-122 range  Right ear bleeding Secondary to patient placing her finger in her ear and picking at it. appears to have improved hemoglobin 11.0 Continue to monitor CBC  DVT Prophylaxis  heparin  Code Status: Full  Family Communication: None at bedside  Disposition Plan: Admitted.  SNF when medically stable--not yet ready as not eating a diet  Consultants Nephrology, via phone, Dr. Posey Pronto Orthopedic via phone General surgery  Procedures  NG tube placement  Antibiotics   Anti-infectives (From admission, onward)   None      Subjective:   Awake alert in nad No stool today No cp No vomit Eating dysph diet without issue Feels no big diff  Objective:   Vitals:   08/09/17 0314 08/09/17 0454 08/09/17 0920 08/09/17 1125  BP:  (!) 132/54  (!) 116/52  Pulse:  62  (!) 54  Resp:  18  18  Temp:  97.9 F (36.6 C)    TempSrc:  Oral  Oral  SpO2:  99% 98% 98%  Weight: 84.9 kg (187 lb 3.2 oz)     Height:        Intake/Output Summary (Last 24 hours) at 08/09/2017 1524 Last data filed at 08/09/2017 1022 Gross per 24 hour  Intake 1320 ml  Output 1000 ml  Net 320 ml   Filed Weights   08/07/17 0600 08/08/17 0436 08/09/17 0314  Weight: 86.7 kg (191 lb 2.2 oz) 86.4 kg (190 lb 7.6 oz) 84.9 kg (187 lb 3.2 oz)   Exam  Poor dentition, flat affect On nasal oxygen S1-S2 no murmur rub or gallop telemetry  Abdomen is soft slightly tender epig rebound bowel sounds are  minimally heard No lower extremity edema  neurologically is intact without deficit  Data Reviewed: I have personally reviewed following labs and imaging studies  CBC: Recent Labs  Lab 08/05/17 0557 08/06/17 0455 08/07/17 0600 08/08/17 0635 08/09/17 0549  WBC 11.0* 10.1 9.1 9.6 11.1*  NEUTROABS  --   --   --  5.0 6.3  HGB 11.2* 11.0* 11.3* 11.3* 11.3*  HCT 33.2* 32.6* 33.2* 33.1* 33.5*  MCV 88.1 88.3 87.4 88.0 89.6  PLT 235 218 230 215 268   Basic Metabolic Panel: Recent Labs  Lab 08/05/17 0557 08/06/17 0455 08/07/17 0600 08/08/17 0635 08/09/17 0549  NA 127* 129* 127* 129* 125*  K 4.2 3.7 3.8 3.5 3.5  CL 84* 86* 84* 87* 85*  CO2 30 30 31  32 29  GLUCOSE 137* 130* 133* 80 77  BUN 21* 21* 10 5* 9  CREATININE 0.57 0.44 0.50 0.43* 0.51  CALCIUM 8.7* 8.6* 8.6* 8.5* 8.4*  MG  --   --   --  1.8 1.7   GFR: Estimated Creatinine Clearance: 85.5 mL/min (by C-G formula based on SCr of 0.51 mg/dL). Liver Function Tests: Recent Labs  Lab 08/04/17 0735 08/08/17 0635  AST 16 17  ALT 23 24  ALKPHOS 58 49  BILITOT 0.5 0.4  PROT 6.0* 5.3*  ALBUMIN 3.0* 2.6*   No results for input(s): LIPASE, AMYLASE in the last 168 hours. No results for input(s): AMMONIA in the last 168 hours. Coagulation Profile: No results for input(s): INR, PROTIME in the last 168 hours. Cardiac Enzymes: No results for input(s): CKTOTAL, CKMB, CKMBINDEX, TROPONINI in the last 168 hours. BNP (last 3 results) No results for input(s): PROBNP in the last 8760 hours. HbA1C: No results for input(s): HGBA1C in the last 72 hours. CBG: Recent Labs  Lab 08/08/17 1122 08/08/17 1640 08/08/17 2140 08/09/17 0739 08/09/17 1104  GLUCAP 95 109* 98 73 122*   Lipid Profile: No results for input(s): CHOL, HDL, LDLCALC, TRIG, CHOLHDL, LDLDIRECT in the last 72 hours. Thyroid Function Tests: No results for input(s): TSH, T4TOTAL, FREET4, T3FREE, THYROIDAB in the last 72 hours. Anemia Panel: No results for  input(s): VITAMINB12, FOLATE, FERRITIN, TIBC, IRON, RETICCTPCT in the last 72 hours. Urine analysis:    Component Value Date/Time   COLORURINE YELLOW 07/31/2017 0114   APPEARANCEUR CLEAR 07/31/2017 0114   APPEARANCEUR Clear 06/22/2012 0618   LABSPEC 1.008 07/31/2017 0114   LABSPEC 1.003 06/22/2012 0618   PHURINE 7.0 07/31/2017 0114   GLUCOSEU 50 (A) 07/31/2017 0114   GLUCOSEU Negative 06/22/2012 0618   GLUCOSEU NEGATIVE 04/09/2011 1058   HGBUR NEGATIVE 07/31/2017 0114   BILIRUBINUR NEGATIVE 07/31/2017 0114   BILIRUBINUR Negative 06/22/2012 0618   KETONESUR 5 (A) 07/31/2017 0114   PROTEINUR NEGATIVE 07/31/2017 0114   UROBILINOGEN 1.0 08/18/2011 1805   NITRITE NEGATIVE 07/31/2017 0114   LEUKOCYTESUR NEGATIVE 07/31/2017 0114   LEUKOCYTESUR Trace 06/22/2012 0618   Sepsis Labs: @LABRCNTIP (procalcitonin:4,lacticidven:4)  ) Recent Results (from the past 240 hour(s))  MRSA PCR Screening     Status: None   Collection Time: 07/31/17  4:35 PM  Result Value Ref Range Status   MRSA by PCR NEGATIVE NEGATIVE Final    Comment:        The GeneXpert MRSA Assay (FDA approved for NASAL specimens only), is one component of a comprehensive MRSA colonization surveillance program. It is not intended to diagnose MRSA infection nor to guide or monitor treatment for MRSA infections. Performed at [facility], [place] [address], [place], [place] [address]       Radiology Studies: Dg Abd 1 View  Result Date: 08/09/2017 CLINICAL DATA:  Abdominal pain EXAM: ABDOMEN - 1 VIEW COMPARISON:  August 07, 2017 FINDINGS: There remain loops of dilated small bowel. There is air in the rectum and colon. No appreciable air-fluid levels. No free air. There are surgical clips in the right upper quadrant. The patient has reportedly undergone vertebroplasty procedures in the sacroiliac joint regions. IMPRESSION: Persistent small bowel dilatation. Question ileus versus a degree of partial obstruction. Note  that there is air in the colon and rectum. No free air evident. Electronically Signed   By: Lowella Grip III M.D.   On: 08/09/2017 08:38     Scheduled Meds: . aspirin EC  81 mg Oral Daily  . bisacodyl  10 mg Rectal Once  . buPROPion  150 mg Oral Daily  . carvedilol  25 mg Oral BID WC  . cloNIDine  0.3 mg Transdermal Weekly  . divalproex  250 mg Oral Q12H  . ferrous sulfate  325 mg Oral Q breakfast  . fluticasone  2 spray Each Nare BID  . fluticasone furoate-vilanterol  1 puff Inhalation Daily  . furosemide  20 mg Intravenous  Daily  . heparin  5,000 Units Subcutaneous Q8H  . hydrALAZINE  100 mg Oral Q8H  . insulin aspart  0-5 Units Subcutaneous QHS  . insulin aspart  0-9 Units Subcutaneous TID WC  . lurasidone  80 mg Oral Q breakfast  . metoprolol tartrate  2.5 mg Intravenous Q6H  . NIFEdipine  90 mg Oral Daily  . pantoprazole  40 mg Oral QHS  . PARoxetine  40 mg Oral Daily  . polyethylene glycol  17 g Oral Daily  . pravastatin  20 mg Oral Daily  . senna  1 tablet Oral Daily  . sodium chloride  1 g Oral BID WC  . sucralfate  1 g Oral QID   Continuous Infusions:    LOS: 9 days   time spent 25 min  Verneita Griffes, MD [facility] [phone]   [facility] Office  [phone]

## 2017-08-09 NOTE — Social Work (Addendum)
CSW f/u for PASSR and it is still pending.  CSW will continue to follow up as PASSR needed for SNF placement.  4:47pm. PASSR pending. CSW will continue to follow as Level 2 assessment needed before they can issue PASSR.  Elissa Hefty, LCSW Clinical Social Worker [phone]

## 2017-08-10 LAB — GLUCOSE, CAPILLARY
GLUCOSE-CAPILLARY: 91 mg/dL (ref 65–99)
GLUCOSE-CAPILLARY: 137 mg/dL — AB (ref 65–99)
GLUCOSE-CAPILLARY: 131 mg/dL — AB (ref 65–99)
Glucose-Capillary: 120 mg/dL — ABNORMAL HIGH (ref 65–99)

## 2017-08-10 LAB — COMPREHENSIVE METABOLIC PANEL
ALBUMIN: 2.6 g/dL — AB (ref 3.5–5.0)
ALT: 14 U/L (ref 14–54)
AST: 9 U/L — ABNORMAL LOW (ref 15–41)
Alkaline Phosphatase: 53 U/L (ref 38–126)
Anion gap: 12 (ref 5–15)
BUN: 9 mg/dL (ref 6–20)
CHLORIDE: 89 mmol/L — AB (ref 101–111)
CO2: 30 mmol/L (ref 22–32)
Calcium: 8.6 mg/dL — ABNORMAL LOW (ref 8.9–10.3)
Creatinine, Ser: 0.55 mg/dL (ref 0.44–1.00)
GFR calc Af Amer: >60 mL/min (ref >60)
GFR calc non Af Amer: >60 mL/min (ref >60)
GLUCOSE: 96 mg/dL (ref 65–99)
POTASSIUM: 3.9 mmol/L (ref 3.5–5.1)
SODIUM: 131 mmol/L — AB (ref 135–145)
TOTAL PROTEIN: 5.4 g/dL — AB (ref 6.5–8.1)
Total Bilirubin: 0.4 mg/dL (ref 0.3–1.2)

## 2017-08-10 LAB — MAGNESIUM: Magnesium: 2 mg/dL (ref 1.7–2.4)

## 2017-08-10 NOTE — Progress Notes (Signed)
PROGRESS NOTE    Natalie Thomas  ONG:[id number] DOB: 1960/07/31 DOA: 07/30/2017 PCP: Renato Shin, MD   Chief Complaint  Patient presents with  . Shortness of Breath  . Fall  . Constipation    X 1 WEEK    Brief Narrative:  HPI on 07/31/2017 by Dr. Gerlean Ren Natalie Thomas is a 57 y.o. female with medical history significant of COPD, GERD, depression, hypertension came to the ER with complaints of right hip pain after fall.  Patient states that yesterday afternoon while transferring herself from the wheelchair she ended up sustaining a fall on the right side hurting her right hip.  She denies any loss of consciousness or any other warning signs prior to the fall.  She states it was purely mechanical fall as she lost her balance.  Since then she has had right-sided hip pain.  During this time she also states she has noticed her shortness of breath has worsened and has some coughing but unable to bring up any sputum.  Denies any fevers, chills and other complaints.  She has not had a bowel movement in over a week now but thinks this may be due to poor p.o. appetite.  Interim history  Fair doing well no distress  Doesn't like dysphagia diet   Assessment & Plan   Acute small bowel obstruction nausea vomiting abdominal pain-CT abdomen pelvis 2/9 high-grade SBO vs Ileus, and per general surgery discontinue NG tube-patient passed stool 2/13 Repeat abdominal x-ray 2/13 and 2/15 shows persistent findings however she is marginally improved Diet advanced from dys 1 to mhec soft and see how tol  Leukocytosis Likely multifactorial including small bowel obstruction, steroids WBC trending downward, currently 11---->9.1->9.6--->11.1  Acute COPD exacerbation No active wheezing and no further need for IV steroids so discontinuing given high risk for further fractures with steroids  Hyponatremia Baseline sodium approximately 130 Unknown etiology- possibly due to antidepressant medications  (patient is on Prozac, wellbutrin, latuda) Sodium on admission 114, currently up to 129 Urine electrolytes: Urine sodium 65, urine osmolality 275 was placed on normal saline and sodium tablets On 08/02/2017: discussed with nephrology, Dr. Posey Pronto, recommended discontinuing IVF and increasing sodium tablets to 2 tabs TID, small dose of IV Lasix ?  salt tablets 1 g bnid--2 tabs iii 2/15 and improved sodium from 125--->131 Continuing IV Lasix 20 daily, will change to p.o. once she is reliably taking  Right-sided hip pain secondary to mechanical fall/right superior pubic ramus fracture CT head/cervical spine unremarkable Right hip x-ray showed no definite acute abnormality--CT right hip: acute nondisplaced fracture of the right superior pubic ramus PT/OT consulted-recommended SNF difficult situation as needs pain control-which may contribute to small bowel obstruction  Dr. Maye Hides D/W orthopedics, patient does not necessarily need outpatient follow-up.  Allow 6 weeks to heal-discussed with patient on 2/14 and have discontinued IV morphine every 4 as needed in place of Percocet oral tablets to see if tolerated and as this may also help with GI motility Awaiting skilled placement once everything resolves  Constipation Discontinue bisacodyl, continue Start MiraLAX and senna scheduled for regular stool  Hypokalemia/hypomagnesemia, hyponatremia, repeat -Resolved, continue to monitor and replace as neded magnesium  Mag low 1.8--1.7, added on 2.15 mag ox 800 bid currently replaced to 2.0  Essential hypertension diabetic controlled now continue IV Lasix, clonidine patch 0.3- q 7  restarted on 2/14 Coreg 25 twice daily, hydralazine 100 every 8, nifedipine 90 daily  Depression/anxiety Will continue Ativan 0.5 every 4 as needed anxiety-on  hold for now are 80 a.m. Wellbutrin 150, Latuda 80 mg every morning 40 mg daily, depakote, Paxil when SBO/ileus has resolved There is some overlap with these medications,  patient should follow-up with her psychiatrist or psychologist as an outpatient better medication management Placed on IV Ativan  Hyperlipidemia Restart statin and continue on discharge  GERD with hiatal hernia Continue PPI, on IV form Transition 2/13 back to oral PPI  Diabetes mellitus, type ii Currently on no home medications, continue insulin sliding scale and CBG monitoring-sugars have been in the 77-122 range  Right ear bleeding Secondary to patient placing her finger in her ear and picking at it. appears to have improved hemoglobin 11.0 Continue to monitor CBC  DVT Prophylaxis  heparin  Code Status: Full  Family Communication: None at bedside  Disposition Plan: Admitted.  SNF when medically stable--not yet ready as not eating a diet  Consultants Nephrology, via phone, Dr. Posey Pronto Orthopedic via phone General surgery  Procedures  NG tube placement  Antibiotics   Anti-infectives (From admission, onward)   None      Subjective:   Awake alert in nad tol diet better 1 medium stool today  No cp No fever abd still a little tender without to A little dizzy with position changes didn't walk today yet  Objective:   Vitals:   08/09/17 0920 08/09/17 1125 08/09/17 1946 08/10/17 0409  BP:  (!) 116/52 (!) 117/54 (!) 126/57  Pulse:  (!) 54 60 (!) 58  Resp:  18 18 18   Temp:   (!) 97.4 F (36.3 C) 98.2 F (36.8 C)  TempSrc:  Oral Oral Oral  SpO2: 98% 98% 96% 97%  Weight:    83.9 kg (185 lb)  Height:        Intake/Output Summary (Last 24 hours) at 08/10/2017 0744 Last data filed at 08/10/2017 0436 Gross per 24 hour  Intake 960 ml  Output 2500 ml  Net -1540 ml   Filed Weights   08/08/17 0436 08/09/17 0314 08/10/17 0409  Weight: 86.4 kg (190 lb 7.6 oz) 84.9 kg (187 lb 3.2 oz) 83.9 kg (185 lb)   Exam  eomi ncat obese flat affect on oxygen cta b no adde dsound abd soft obese nt nd no rebound No le edema   Data Reviewed: I have personally reviewed  following labs and imaging studies  CBC: Recent Labs  Lab 08/05/17 0557 08/06/17 0455 08/07/17 0600 08/08/17 0635 08/09/17 0549  WBC 11.0* 10.1 9.1 9.6 11.1*  NEUTROABS  --   --   --  5.0 6.3  HGB 11.2* 11.0* 11.3* 11.3* 11.3*  HCT 33.2* 32.6* 33.2* 33.1* 33.5*  MCV 88.1 88.3 87.4 88.0 89.6  PLT 235 218 230 215 829   Basic Metabolic Panel: Recent Labs  Lab 08/06/17 0455 08/07/17 0600 08/08/17 0635 08/09/17 0549 08/10/17 0546  NA 129* 127* 129* 125* 131*  K 3.7 3.8 3.5 3.5 3.9  CL 86* 84* 87* 85* 89*  CO2 30 31 32 29 30  GLUCOSE 130* 133* 80 77 96  BUN 21* 10 5* 9 9  CREATININE 0.44 0.50 0.43* 0.51 0.55  CALCIUM 8.6* 8.6* 8.5* 8.4* 8.6*  MG  --   --  1.8 1.7 2.0   GFR: Estimated Creatinine Clearance: 85 mL/min (by C-G formula based on SCr of 0.55 mg/dL). Liver Function Tests: Recent Labs  Lab 08/04/17 0735 08/08/17 0635 08/10/17 0546  AST 16 17 9*  ALT 23 24 14   ALKPHOS 58 49 53  BILITOT 0.5 0.4 0.4  PROT 6.0* 5.3* 5.4*  ALBUMIN 3.0* 2.6* 2.6*   No results for input(s): LIPASE, AMYLASE in the last 168 hours. No results for input(s): AMMONIA in the last 168 hours. Coagulation Profile: No results for input(s): INR, PROTIME in the last 168 hours. Cardiac Enzymes: No results for input(s): CKTOTAL, CKMB, CKMBINDEX, TROPONINI in the last 168 hours. BNP (last 3 results) No results for input(s): PROBNP in the last 8760 hours. HbA1C: No results for input(s): HGBA1C in the last 72 hours. CBG: Recent Labs  Lab 08/09/17 0739 08/09/17 1104 08/09/17 1626 08/09/17 2115 08/10/17 0740  GLUCAP 73 122* 133* 97 91   Lipid Profile: No results for input(s): CHOL, HDL, LDLCALC, TRIG, CHOLHDL, LDLDIRECT in the last 72 hours. Thyroid Function Tests: No results for input(s): TSH, T4TOTAL, FREET4, T3FREE, THYROIDAB in the last 72 hours. Anemia Panel: No results for input(s): VITAMINB12, FOLATE, FERRITIN, TIBC, IRON, RETICCTPCT in the last 72 hours. Urine analysis:      Component Value Date/Time   COLORURINE YELLOW 07/31/2017 0114   APPEARANCEUR CLEAR 07/31/2017 0114   APPEARANCEUR Clear 06/22/2012 0618   LABSPEC 1.008 07/31/2017 0114   LABSPEC 1.003 06/22/2012 0618   PHURINE 7.0 07/31/2017 0114   GLUCOSEU 50 (A) 07/31/2017 0114   GLUCOSEU Negative 06/22/2012 0618   GLUCOSEU NEGATIVE 04/09/2011 1058   HGBUR NEGATIVE 07/31/2017 0114   BILIRUBINUR NEGATIVE 07/31/2017 0114   BILIRUBINUR Negative 06/22/2012 0618   KETONESUR 5 (A) 07/31/2017 0114   PROTEINUR NEGATIVE 07/31/2017 0114   UROBILINOGEN 1.0 08/18/2011 1805   NITRITE NEGATIVE 07/31/2017 0114   LEUKOCYTESUR NEGATIVE 07/31/2017 0114   LEUKOCYTESUR Trace 06/22/2012 0618   Sepsis Labs: @LABRCNTIP (procalcitonin:4,lacticidven:4)  ) Recent Results (from the past 240 hour(s))  MRSA PCR Screening     Status: None   Collection Time: 07/31/17  4:35 PM  Result Value Ref Range Status   MRSA by PCR NEGATIVE NEGATIVE Final    Comment:        The GeneXpert MRSA Assay (FDA approved for NASAL specimens only), is one component of a comprehensive MRSA colonization surveillance program. It is not intended to diagnose MRSA infection nor to guide or monitor treatment for MRSA infections. Performed at [facility], [place] [address] [place], [place] [address]       Radiology Studies: Dg Abd 1 View  Result Date: 08/09/2017 CLINICAL DATA:  Abdominal pain EXAM: ABDOMEN - 1 VIEW COMPARISON:  August 07, 2017 FINDINGS: There remain loops of dilated small bowel. There is air in the rectum and colon. No appreciable air-fluid levels. No free air. There are surgical clips in the right upper quadrant. The patient has reportedly undergone vertebroplasty procedures in the sacroiliac joint regions. IMPRESSION: Persistent small bowel dilatation. Question ileus versus a degree of partial obstruction. Note that there is air in the colon and rectum. No free air evident. Electronically Signed   By: Lowella Grip III M.D.   On: 08/09/2017 08:38     Scheduled Meds: . aspirin EC  81 mg Oral Daily  . bisacodyl  10 mg Rectal Once  . buPROPion  150 mg Oral Daily  . carvedilol  25 mg Oral BID WC  . cloNIDine  0.3 mg Transdermal Weekly  . divalproex  250 mg Oral Q12H  . ferrous sulfate  325 mg Oral Q breakfast  . fluticasone  2 spray Each Nare BID  . fluticasone furoate-vilanterol  1 puff Inhalation Daily  . furosemide  20 mg Intravenous  Daily  . heparin  5,000 Units Subcutaneous Q8H  . hydrALAZINE  100 mg Oral Q8H  . insulin aspart  0-5 Units Subcutaneous QHS  . insulin aspart  0-9 Units Subcutaneous TID WC  . lurasidone  80 mg Oral Q breakfast  . magnesium oxide  800 mg Oral BID  . metoprolol tartrate  2.5 mg Intravenous Q6H  . NIFEdipine  90 mg Oral Daily  . pantoprazole  40 mg Oral QHS  . PARoxetine  40 mg Oral Daily  . polyethylene glycol  17 g Oral Daily  . pravastatin  20 mg Oral Daily  . senna  1 tablet Oral Daily  . sodium chloride  2 g Oral TID WC  . sucralfate  1 g Oral QID   Continuous Infusions:    LOS: 10 days   time spent 15 min  Verneita Griffes, MD [facility] [phone]   [facility] Office  [phone]

## 2017-08-10 NOTE — Plan of Care (Signed)
Patient was given ativan by previous Therapist, sports. She rested well throughout the night without signs of anxiety. Continued to monitor for patient safety and comfort.

## 2017-08-11 LAB — BASIC METABOLIC PANEL
Anion gap: 9 (ref 5–15)
BUN: 7 mg/dL (ref 6–20)
CHLORIDE: 90 mmol/L — AB (ref 101–111)
CO2: 29 mmol/L (ref 22–32)
Calcium: 8.5 mg/dL — ABNORMAL LOW (ref 8.9–10.3)
Creatinine, Ser: 0.41 mg/dL — ABNORMAL LOW (ref 0.44–1.00)
GFR calc non Af Amer: >60 mL/min (ref >60)
Glucose, Bld: 95 mg/dL (ref 65–99)
POTASSIUM: 4.2 mmol/L (ref 3.5–5.1)
Sodium: 128 mmol/L — ABNORMAL LOW (ref 135–145)

## 2017-08-11 LAB — GLUCOSE, CAPILLARY
GLUCOSE-CAPILLARY: 108 mg/dL — AB (ref 65–99)
GLUCOSE-CAPILLARY: 122 mg/dL — AB (ref 65–99)
Glucose-Capillary: 92 mg/dL (ref 65–99)
Glucose-Capillary: 100 mg/dL — ABNORMAL HIGH (ref 65–99)

## 2017-08-11 LAB — MAGNESIUM: Magnesium: 1.9 mg/dL (ref 1.7–2.4)

## 2017-08-11 MED ORDER — FUROSEMIDE 40 MG PO TABS
40.0000 mg | ORAL_TABLET | Freq: Every day | ORAL | Status: DC
  Administered 2017-08-11 – 2017-08-12 (×2): 40 mg via ORAL
  Filled 2017-08-11 (×2): qty 1

## 2017-08-11 NOTE — Progress Notes (Signed)
PROGRESS NOTE    Natalie Thomas  KKX:[id number] DOB: 1960-12-21 DOA: 07/30/2017 PCP: Renato Shin, MD   Chief Complaint  Patient presents with  . Shortness of Breath  . Fall  . Constipation    X 1 WEEK    Brief Narrative:  HPI on 07/31/2017 by Dr. Gerlean Ren Natalie Thomas is a 57 y.o. female with medical history significant of COPD, GERD, depression, hypertension came to the ER with complaints of right hip pain after fall.  Patient states that yesterday afternoon while transferring herself from the wheelchair she ended up sustaining a fall on the right side hurting her right hip.  She denies any loss of consciousness or any other warning signs prior to the fall.  She states it was purely mechanical fall as she lost her balance.  Since then she has had right-sided hip pain.  During this time she also states she has noticed her shortness of breath has worsened and has some coughing but unable to bring up any sputum.  Denies any fevers, chills and other complaints.  She has not had a bowel movement in over a week now but thinks this may be due to poor p.o. appetite.  Interim history  Fair doing well no distress  Doesn't like dysphagia diet   Assessment & Plan   Acute small bowel obstruction nausea vomiting abdominal pain-CT abdomen pelvis 2/9 high-grade SBO vs Ileus, and per general surgery discontinue NG tube-patient passed stool 2/13 Repeat abdominal x-ray 2/13 and 2/15 shows persistent findings however she is marginally improved Diet advanced from dys 1 to mech soft and see how tol  Leukocytosis Likely multifactorial including small bowel obstruction, steroids WBC trending downward, currently 11---->9.1->9.6--->11.1  Acute COPD exacerbation No active wheezing and no further need for IV steroids so discontinuing given high risk for further fractures with steroids  Hyponatremia Baseline sodium approximately 130 Unknown etiology- possibly due to antidepressant medications  (patient is on Prozac, wellbutrin, latuda) Sodium on admission 114, currently up to 129 Urine electrolytes: Urine sodium 65, urine osmolality 275 was placed on normal saline and sodium tablets On 08/02/2017: discussed with nephrology, Dr. Posey Pronto, recommended discontinuing IVF and increasing sodium tablets to 2 tabs TID, small dose of IV Lasix ?  salt tablets 1 g bid--2 tabs iii 2/15 and improved sodium from 125--->131 Changing from IV to p.o. Lasix 40 po daily 2/17  Right-sided hip pain secondary to mechanical fall/right superior pubic ramus fracture CT head/cervical spine unremarkable Right hip x-ray showed no definite acute abnormality--CT right hip: acute nondisplaced fracture of the right superior pubic ramus PT/OT consulted-recommended SNF difficult situation as needs pain control-which may contribute to small bowel obstruction  Dr. Maye Hides D/W orthopedics, patient does not necessarily need outpatient follow-up.  Allow 6 weeks to heal-discussed with patient on 2/14 and have discontinued IV morphine every 4 as needed in place of Percocet oral tablets to see if tolerated and as this may also help with GI motility Awaiting skilled placement in the next 24-48 hours  Constipation Discontinue bisacodyl, continue Start MiraLAX and senna scheduled for regular stool  Hypokalemia/hypomagnesemia, hyponatremia, repeat -Resolved, continue to monitor and replace as neded magnesium  Mag low 1.8--1.7, added on 2.15 mag ox 800 bid- current 1.9  Essential hypertension - controlled now continue IV Lasix, clonidine patch 0.3- q 7  restarted on 2/14 Coreg 25 twice daily, hydralazine 100 every 8, nifedipine 90 daily  Depression/anxiety Will continue Ativan 0.5 every 4 as needed anxiety-on hold for now are  80 a.m. Wellbutrin 150, Latuda 80 mg every morning 40 mg daily, depakote, Paxil when SBO/ileus has resolved There is some overlap with these medications, patient should follow-up with her psychiatrist or  psychologist as an outpatient better medication management Placed on IV Ativan  Hyperlipidemia Restart statin and continue on discharge  GERD with hiatal hernia Continue PPI, on IV form Transition 2/13 back to oral PPI  Diabetes mellitus, type ii Currently on no home medications, continue insulin sliding scale and CBG monitoring-sugars have been in the 77-122 range  Right ear bleeding Secondary to patient placing her finger in her ear and picking at it. appears to have improved hemoglobin 11.0 Continue to monitor CBC  DVT Prophylaxis  heparin  Code Status: Full  Family Communication: None at bedside  Disposition Plan: Admitted.  SNF when medically stable--not yet ready as not eating a diet  Consultants Nephrology, via phone, Dr. Posey Pronto Orthopedic via phone General surgery  Procedures  NG tube placement  Antibiotics   Anti-infectives (From admission, onward)   None      Subjective:   Tolerating diet no distress no abdominal pain some stool No chest pain Asking for dysphagia 3 diet   Objective:   Vitals:   08/11/17 0455 08/11/17 0953 08/11/17 0957 08/11/17 1134  BP: 130/63   (!) 114/59  Pulse: (!) 56   (!) 56  Resp: 18   18  Temp: 98.1 F (36.7 C)   (!) 97.5 F (36.4 C)  TempSrc: Oral   Oral  SpO2: 99% 94% 94% 99%  Weight: 88.9 kg (195 lb 15.8 oz)     Height:        Intake/Output Summary (Last 24 hours) at 08/11/2017 1548 Last data filed at 08/11/2017 1405 Gross per 24 hour  Intake 2160 ml  Output 1202 ml  Net 958 ml   Filed Weights   08/09/17 0314 08/10/17 0409 08/11/17 0455  Weight: 84.9 kg (187 lb 3.2 oz) 83.9 kg (185 lb) 88.9 kg (195 lb 15.8 oz)   Exam  eomi ncat obese flat affect on oxygen Alert oriented no distress Chest is clinically clear without added sounds Abdomen is soft slightly distended but not as tender as previous cta b no adde dsound abd soft obese nt nd no rebound No le edema   Data Reviewed: I have personally reviewed  following labs and imaging studies  CBC: Recent Labs  Lab 08/05/17 0557 08/06/17 0455 08/07/17 0600 08/08/17 0635 08/09/17 0549  WBC 11.0* 10.1 9.1 9.6 11.1*  NEUTROABS  --   --   --  5.0 6.3  HGB 11.2* 11.0* 11.3* 11.3* 11.3*  HCT 33.2* 32.6* 33.2* 33.1* 33.5*  MCV 88.1 88.3 87.4 88.0 89.6  PLT 235 218 230 215 960   Basic Metabolic Panel: Recent Labs  Lab 08/07/17 0600 08/08/17 0635 08/09/17 0549 08/10/17 0546 08/11/17 0800  NA 127* 129* 125* 131* 128*  K 3.8 3.5 3.5 3.9 4.2  CL 84* 87* 85* 89* 90*  CO2 31 32 29 30 29   GLUCOSE 133* 80 77 96 95  BUN 10 5* 9 9 7   CREATININE 0.50 0.43* 0.51 0.55 0.41*  CALCIUM 8.6* 8.5* 8.4* 8.6* 8.5*  MG  --  1.8 1.7 2.0 1.9   GFR: Estimated Creatinine Clearance: 87.6 mL/min (A) (by C-G formula based on SCr of 0.41 mg/dL (L)). Liver Function Tests: Recent Labs  Lab 08/08/17 0635 08/10/17 0546  AST 17 9*  ALT 24 14  ALKPHOS 49 53  BILITOT 0.4  0.4  PROT 5.3* 5.4*  ALBUMIN 2.6* 2.6*   No results for input(s): LIPASE, AMYLASE in the last 168 hours. No results for input(s): AMMONIA in the last 168 hours. Coagulation Profile: No results for input(s): INR, PROTIME in the last 168 hours. Cardiac Enzymes: No results for input(s): CKTOTAL, CKMB, CKMBINDEX, TROPONINI in the last 168 hours. BNP (last 3 results) No results for input(s): PROBNP in the last 8760 hours. HbA1C: No results for input(s): HGBA1C in the last 72 hours. CBG: Recent Labs  Lab 08/10/17 1240 08/10/17 1659 08/10/17 2105 08/11/17 0727 08/11/17 1110  GLUCAP 120* 137* 131* 92 108*   Lipid Profile: No results for input(s): CHOL, HDL, LDLCALC, TRIG, CHOLHDL, LDLDIRECT in the last 72 hours. Thyroid Function Tests: No results for input(s): TSH, T4TOTAL, FREET4, T3FREE, THYROIDAB in the last 72 hours. Anemia Panel: No results for input(s): VITAMINB12, FOLATE, FERRITIN, TIBC, IRON, RETICCTPCT in the last 72 hours. Urine analysis:    Component Value Date/Time     COLORURINE YELLOW 07/31/2017 0114   APPEARANCEUR CLEAR 07/31/2017 0114   APPEARANCEUR Clear 06/22/2012 0618   LABSPEC 1.008 07/31/2017 0114   LABSPEC 1.003 06/22/2012 0618   PHURINE 7.0 07/31/2017 0114   GLUCOSEU 50 (A) 07/31/2017 0114   GLUCOSEU Negative 06/22/2012 0618   GLUCOSEU NEGATIVE 04/09/2011 1058   HGBUR NEGATIVE 07/31/2017 0114   BILIRUBINUR NEGATIVE 07/31/2017 0114   BILIRUBINUR Negative 06/22/2012 0618   KETONESUR 5 (A) 07/31/2017 0114   PROTEINUR NEGATIVE 07/31/2017 0114   UROBILINOGEN 1.0 08/18/2011 1805   NITRITE NEGATIVE 07/31/2017 0114   LEUKOCYTESUR NEGATIVE 07/31/2017 0114   LEUKOCYTESUR Trace 06/22/2012 0618   Sepsis Labs: @LABRCNTIP (procalcitonin:4,lacticidven:4)  ) No results found for this or any previous visit (from the past 240 hour(s)).    Radiology Studies: No results found.   Scheduled Meds: . aspirin EC  81 mg Oral Daily  . bisacodyl  10 mg Rectal Once  . buPROPion  150 mg Oral Daily  . carvedilol  25 mg Oral BID WC  . cloNIDine  0.3 mg Transdermal Weekly  . divalproex  250 mg Oral Q12H  . ferrous sulfate  325 mg Oral Q breakfast  . fluticasone  2 spray Each Nare BID  . fluticasone furoate-vilanterol  1 puff Inhalation Daily  . furosemide  20 mg Intravenous Daily  . heparin  5,000 Units Subcutaneous Q8H  . hydrALAZINE  100 mg Oral Q8H  . insulin aspart  0-5 Units Subcutaneous QHS  . insulin aspart  0-9 Units Subcutaneous TID WC  . lurasidone  80 mg Oral Q breakfast  . magnesium oxide  800 mg Oral BID  . NIFEdipine  90 mg Oral Daily  . pantoprazole  40 mg Oral QHS  . PARoxetine  40 mg Oral Daily  . polyethylene glycol  17 g Oral Daily  . pravastatin  20 mg Oral Daily  . senna  1 tablet Oral Daily  . sodium chloride  2 g Oral TID WC  . sucralfate  1 g Oral QID   Continuous Infusions:    LOS: 11 days   time spent 15 min  Verneita Griffes, MD [facility] [phone]   [facility] Office  [phone]

## 2017-08-12 LAB — BASIC METABOLIC PANEL
Anion gap: 10 (ref 5–15)
BUN: 5 mg/dL — AB (ref 6–20)
CO2: 30 mmol/L (ref 22–32)
Calcium: 8.6 mg/dL — ABNORMAL LOW (ref 8.9–10.3)
Chloride: 91 mmol/L — ABNORMAL LOW (ref 101–111)
Creatinine, Ser: 0.5 mg/dL (ref 0.44–1.00)
GFR calc Af Amer: >60 mL/min (ref >60)
GFR calc non Af Amer: >60 mL/min (ref >60)
GLUCOSE: 91 mg/dL (ref 65–99)
POTASSIUM: 4.3 mmol/L (ref 3.5–5.1)
Sodium: 131 mmol/L — ABNORMAL LOW (ref 135–145)

## 2017-08-12 LAB — CBC WITH DIFFERENTIAL/PLATELET
BASOS ABS: 0 10*3/uL (ref 0.0–0.1)
Basophils Relative: 0 %
Eosinophils Absolute: 0.1 10*3/uL (ref 0.0–0.7)
Eosinophils Relative: 1 %
HEMATOCRIT: 30 % — AB (ref 36.0–46.0)
Hemoglobin: 9.8 g/dL — ABNORMAL LOW (ref 12.0–15.0)
LYMPHS PCT: 22 %
Lymphs Abs: 2.1 10*3/uL (ref 0.7–4.0)
MCH: 30.2 pg (ref 26.0–34.0)
MCHC: 32.7 g/dL (ref 30.0–36.0)
MCV: 92.3 fL (ref 78.0–100.0)
MONO ABS: 0.8 10*3/uL (ref 0.1–1.0)
MONOS PCT: 9 %
NEUTROS ABS: 6.3 10*3/uL (ref 1.7–7.7)
Neutrophils Relative %: 68 %
Platelets: 205 10*3/uL (ref 150–400)
RBC: 3.25 MIL/uL — ABNORMAL LOW (ref 3.87–5.11)
RDW: 14.7 % (ref 11.5–15.5)
WBC: 9.2 10*3/uL (ref 4.0–10.5)

## 2017-08-12 LAB — MAGNESIUM: Magnesium: 1.9 mg/dL (ref 1.7–2.4)

## 2017-08-12 MED ORDER — DIVALPROEX SODIUM 250 MG PO DR TAB: 250.0000 mg | DELAYED_RELEASE_TABLET | Freq: Two times a day (BID) | ORAL | 0 refills | Status: DC

## 2017-08-12 MED ORDER — OXYCODONE-ACETAMINOPHEN 5-325 MG PO TABS: 1.0000 | ORAL_TABLET | ORAL | 0 refills | Status: DC | PRN

## 2017-08-12 MED ORDER — LURASIDONE HCL 80 MG PO TABS: 80.0000 mg | ORAL_TABLET | Freq: Every day | ORAL | 0 refills | Status: DC

## 2017-08-12 MED ORDER — BUPROPION HCL ER (XL) 150 MG PO TB24: 150.0000 mg | ORAL_TABLET | Freq: Every day | ORAL | 0 refills | Status: DC

## 2017-08-12 MED ORDER — ALPRAZOLAM 0.5 MG PO TABS: 0.5000 mg | ORAL_TABLET | Freq: Three times a day (TID) | ORAL | 0 refills | Status: DC

## 2017-08-12 MED ORDER — PAROXETINE HCL 20 MG PO TABS: 40.0000 mg | ORAL_TABLET | Freq: Every day | ORAL | 0 refills | Status: DC

## 2017-08-12 MED ORDER — SENNA 8.6 MG PO TABS: 1.0000 | ORAL_TABLET | Freq: Every day | ORAL | 0 refills | Status: DC

## 2017-08-12 MED ORDER — FUROSEMIDE 40 MG PO TABS: 40.0000 mg | ORAL_TABLET | Freq: Every day | ORAL | Status: DC

## 2017-08-12 MED ORDER — MAGNESIUM OXIDE 400 (241.3 MG) MG PO TABS: 800.0000 mg | ORAL_TABLET | Freq: Two times a day (BID) | ORAL | Status: DC

## 2017-08-12 NOTE — Progress Notes (Signed)
PT Cancellation Note  Patient Details Name: CURRY DULSKI MRN: [id number] DOB: Jun 12, 1961   Cancelled Treatment:    Reason Eval/Treat Not Completed: (P) Other (comment)(Pt awaiting PTAR for transfer to SNF will defer PT needs to next level of care.  )   Inez Stantz Eli Hose 08/12/2017, 3:00 PM  Governor [place]ook[place], PTA pager [phone]

## 2017-08-12 NOTE — Progress Notes (Signed)
[facility]/Trauma Progress Note      Assessment/Plan  COPD Hyponatremia Right superior pubic ramus fx 2/2 fall 2/5 HTN Depression/anxiety HLD GERD DM  Ileus vs pSBO - CT 2/9 showed high-grade small bowel obstruction with transition point within the lower right abdomen - pt tolerating diet and having BM's  ID - none FEN - dysphagia 3 diet VTE - heparin Foley - wick Follow up - none  Plan - Pt is tolerating diet and having BM's. Surgery will sign off. Please page [place] with any further needs.      LOS: 12 days    Subjective: CC: mild abdominal pain  Pt states mild pain in LLQ. No nausea or vomiting. Having BM's and tolerating diet.   Objective: Vital signs in last 24 hours: Temp:  [97.5 F (36.4 C)-98.5 F (36.9 C)] 98.5 F (36.9 C) (02/18 0513) Pulse Rate:  [56-58] 56 (02/18 0513) Resp:  [18] 18 (02/18 0513) BP: (114-120)/(54-59) 120/54 (02/18 0513) SpO2:  [94 %-100 %] 99 % (02/18 0513) Weight:  [195 lb 8.8 oz (88.7 kg)] 195 lb 8.8 oz (88.7 kg) (02/18 0513) Last BM Date: 08/11/17  Intake/Output from previous day: 02/17 0701 - 02/18 0700 In: 2040 [P.O.:2040] Out: 2376 [Urine:3750; Stool:2] Intake/Output this shift: No intake/output data recorded.  PE: Gen:  Alert, NAD, pleasant Pulm: rate and effort normal Abd: obese, soft, ND, +BS, no HSM, no hernia, very mild TTP in LLQ without guarding Skin: no rashes noted, warm and dry   Anti-infectives: Anti-infectives (From admission, onward)   None      Lab Results:  Recent Labs    08/12/17 0555  WBC 9.2  HGB 9.8*  HCT 30.0*  PLT 205   BMET Recent Labs    08/11/17 0800 08/12/17 0555  NA 128* 131*  K 4.2 4.3  CL 90* 91*  CO2 29 30  GLUCOSE 95 91  BUN 7 5*  CREATININE 0.41* 0.50  CALCIUM 8.5* 8.6*   PT/INR No results for input(s): LABPROT, INR in the last 72 hours. CMP     Component Value Date/Time   NA 131 (L) 08/12/2017 0555   NA 128 (L) 06/23/2012 0536   K 4.3  08/12/2017 0555   K 4.3 06/23/2012 0536   CL 91 (L) 08/12/2017 0555   CL 92 (L) 06/23/2012 0536   CO2 30 08/12/2017 0555   CO2 28 06/23/2012 0536   GLUCOSE 91 08/12/2017 0555   GLUCOSE 237 (H) 06/23/2012 0536   BUN 5 (L) 08/12/2017 0555   BUN 4 (L) 06/23/2012 0536   CREATININE 0.50 08/12/2017 0555   CREATININE 0.42 (L) 06/23/2012 0536   CALCIUM 8.6 (L) 08/12/2017 0555   CALCIUM 9.1 06/23/2012 0536   CALCIUM 9.3 04/09/2011 1058   PROT 5.4 (L) 08/10/2017 0546   PROT 6.9 06/22/2012 0014   ALBUMIN 2.6 (L) 08/10/2017 0546   ALBUMIN 2.8 (L) 06/22/2012 0014   AST 9 (L) 08/10/2017 0546   AST 10 (L) 06/22/2012 0014   ALT 14 08/10/2017 0546   ALT 11 (L) 06/22/2012 0014   ALKPHOS 53 08/10/2017 0546   ALKPHOS 81 06/22/2012 0014   BILITOT 0.4 08/10/2017 0546   BILITOT 0.2 06/22/2012 0014   GFRNONAA >60 08/12/2017 0555   GFRNONAA >60 06/23/2012 0536   GFRAA >60 08/12/2017 0555   GFRAA >60 06/23/2012 0536   Lipase     Component Value Date/Time   LIPASE 24 07/25/2017 0149    Studies/Results: No results found.    Natalie Thomas  Jacklynn Ganong , [facility] 08/12/2017, 8:39 AM Pager: [phone] Consults: [phone] Mon-Fri 7:00 am-4:30 pm Sat-Sun 7:00 am-11:30 am

## 2017-08-12 NOTE — Discharge Summary (Signed)
Physician Discharge Summary  Natalie Thomas TIR:[id number] DOB: July 29, 1960 DOA: 07/30/2017  PCP: Renato Shin, MD  Admit date: 07/30/2017 Discharge date: 08/12/2017  Time spent: 35 minutes  Recommendations for Outpatient Follow-up:  1. Recommend outpatient management with orthopedics as needed regarding pubic ramus fracture and this will have to be scheduled from skilled facility 2. Recommend basic metabolic panel in addition to magnesium-have been placed on sodium tablets 2 tablets 3 times daily which can be cut back to 1 tablet 3 times daily in a week depending on labs and ultimately discontinued 3. Dysphagia 3 diet on going forward given difficulty eating with poor dentition, recent ileus 4. Patient is weightbearing as tolerated on discharge for mobility 5. She was started on Lasix for hypervolemic hyponatremia and this may need to be discontinued going forward depending on resolution of her sodium 6. Please evaluate the patient for A1c and workup of diabetes if felt necessary-she was on steroids this admission which may have contributed to hypoglycemia.  Discharge Diagnoses:  Active Problems:   Hyponatremia   Discharge Condition: Improved  Diet recommendation: Regular normal salt in diet  Filed Weights   08/10/17 0409 08/11/17 0455 08/12/17 0513  Weight: 83.9 kg (185 lb) 88.9 kg (195 lb 15.8 oz) 88.7 kg (195 lb 8.8 oz)    History of present illness:  HPI on 07/31/2017 by Dr. Karoline Caldwell Spiresis a 57 y.o.femalewith medical history significant ofCOPD, GERD, depression, hypertension came to the ER with complaints of right hip pain after fall. Patient states that yesterday afternoon while transferring herself from the wheelchair she ended up sustaining a fall on the right side hurting her right hip. She denies any loss of consciousness or any other warning signs prior to the fall. She states it was purely mechanical fall as she lost her balance. Since then she has had  right-sided hip pain. During this time she also states she has noticed her shortness of breath has worsened and has some coughing but unable to bring up any sputum. Denies any fevers, chills and other complaints. She has not had a bowel movement in over a week now but thinks this may be due to poor p.o. appetite.    Hospital Course:  Acute small bowel obstruction nausea vomiting abdominal pain-CT abdomen pelvis 2/9 high-grade SBO vs Ileus, initially did have NG tube which was eventually discontinued-patient passed stool 2/13 Repeat abdominal x-ray 2/13 and 2/15 shows persistent findings however she clinically did improve Patient was advanced generally from a soft diet to dysphagia 1 and then finally dysphagia 3 and tolerated the same well and it was felt that she had stabilized sufficiently to discharge on her way to facility  Leukocytosis Likely multifactorial including small bowel obstruction, steroids WBC trending downward, currently 11---->9.1->9.6--->11.1 and had resolved without any signs of active infection  Acute COPD exacerbation No active wheezing and no further need for IV steroids so discontinuing given high risk for further fractures with steroids  Hyponatremia Baseline sodium approximately 130 Unknown etiology- possibly due to antidepressant medications (patient is on Prozac, wellbutrin, latuda) Sodium on admission 114, currently up to 129 Urine electrolytes: Urine sodium 65, urine osmolality 275 was placed on normal saline and sodium tablets On 08/02/2017: discussed with nephrology, Dr. Posey Pronto, recommended discontinuing IVF and increasing sodium tablets to 2 tabs TID, small dose of IV Lasix ?  salt tablets 1 g bid--2 tabs iii 2/15 and improved sodium from 125--->131 on discharge Changing from IV to p.o. Lasix 40 po daily  2/17 and will continue this until sodium seems to resolve  Right-sided hip pain secondary to mechanical fall/right superior pubic ramus fracture CT  head/cervical spine unremarkable Right hip x-ray showed no definite acute abnormality--CT right hip: acute nondisplaced fracture of the right superior pubic ramus PT/OT consulted-recommended SNF difficult situation as needs pain control-which may contribute to small bowel obstruction  Dr. Maye Hides D/W orthopedics, patient does not necessarily need outpatient follow-up.  Allow 6 weeks to heal-discussed with patient on 2/14 and have discontinued IV morphine every 4 as needed in place of Percocet oral tablets to see if tolerated and as this may also help with GI motility Awaiting skilled placement in the next 24-48 hours  Constipation Discontinue bisacodyl, continue Start MiraLAX and senna scheduled for regular stool  Hypokalemia/hypomagnesemia, hyponatremia, repeat -Resolved, continue to monitor and replace as neded magnesium  Mag low 1.8--1.7, added on 2.15 mag ox 800 bid- current 1.9  Essential hypertension - controlled now continue IV Lasix, clonidine patch 0.3- q 7  restarted on 2/14 Coreg 25 twice daily, hydralazine 100 every 8, nifedipine 90 daily  Depression/anxiety Will continue Ativan 0.5 every 4 as needed anxiety-on hold for now are 80 a.m. Wellbutrin 150, Latuda 80 mg every morning 40 mg daily, depakote, Paxil when SBO/ileus has resolved There is some overlap with these medications, patient should follow-up with her psychiatrist or psychologist as an outpatient better medication management Scripts given on discharge for meds  Hyperlipidemia Restart statin and continue on discharge  GERD with hiatal hernia Continue PPI, on IV form Transition 2/13 back to oral PPI  Diabetes mellitus, type ii Currently on no home medications,  in the hospital was placed on insulin sliding scale and may need outpatient management of the same if trends above 140 CBG monitoring-sugars 91-131  Right ear bleeding Secondary to patient placing her finger in her ear and picking at it. appears  to have improved hemoglobin 11.0 Continue to monitor CBC    Consultants Nephrology, via phone, Dr. Posey Pronto Orthopedic via phone General surgery  Procedures  NG tube placement  Discharge Exam: Vitals:   08/12/17 0927 08/12/17 1019  BP:  124/64  Pulse:  (!) 57  Resp:    Temp:    SpO2: 97%     General: Awake alert pleasant no distress tolerating diet Cardiovascular: EOMI NCAT S1-S2 no murmur rub or gallop telemetry benign Respiratory: Clinically clear no added sound Abdomen soft no rebound no central abdominal tenderness no guarding Neurologically is intact  Discharge Instructions    Allergies as of 08/12/2017      Reactions   Clarithromycin Itching   Penicillins Itching      Medication List    STOP taking these medications   divalproex 500 MG 24 hr tablet Commonly known as:  DEPAKOTE ER Replaced by:  divalproex 250 MG DR tablet   HYDROcodone-acetaminophen 5-325 MG tablet Commonly known as:  NORCO/VICODIN   loperamide 2 MG capsule Commonly known as:  IMODIUM     TAKE these medications   acetaminophen 325 MG tablet Commonly known as:  TYLENOL Take 650 mg by mouth every 6 (six) hours as needed for mild pain.   albuterol 108 (90 Base) MCG/ACT inhaler Commonly known as:  PROVENTIL HFA;VENTOLIN HFA Inhale 2 puffs into the lungs every 4 (four) hours as needed for wheezing or shortness of breath.   ALPRAZolam 0.5 MG tablet Commonly known as:  XANAX Take 1 tablet (0.5 mg total) by mouth 3 (three) times daily.   antiseptic oral  rinse Liqd 2 mLs by Mouth Rinse route as needed for dry mouth.   aspirin 81 MG tablet Take 1 tablet (81 mg total) by mouth daily. For heart health   BREO ELLIPTA 100-25 MCG/INH Aepb Generic drug:  fluticasone furoate-vilanterol Inhale 1 puff into the lungs daily.   buPROPion 150 MG 24 hr tablet Commonly known as:  WELLBUTRIN XL Take 1 tablet (150 mg total) by mouth daily. Start taking on:  08/13/2017   carvedilol 25 MG  tablet Commonly known as:  COREG Take 25 mg by mouth 2 (two) times daily with a meal.   cloNIDine 0.3 mg/24hr patch Commonly known as:  CATAPRES - Dosed in mg/24 hr Place 0.3 mg onto the skin once a week.   DAILY-VITE PO Take 1 tablet by mouth daily.   divalproex 250 MG DR tablet Commonly known as:  DEPAKOTE Take 1 tablet (250 mg total) by mouth every 12 (twelve) hours. Replaces:  divalproex 500 MG 24 hr tablet   ferrous sulfate 325 (65 FE) MG tablet Take 325 mg by mouth daily with breakfast.   fluticasone 50 MCG/ACT nasal spray Commonly known as:  FLONASE Place 2 sprays into both nostrils 2 (two) times daily.   furosemide 40 MG tablet Commonly known as:  LASIX Take 1 tablet (40 mg total) by mouth daily. Start taking on:  08/13/2017 What changed:    medication strength  how much to take  additional instructions   guaifenesin 100 MG/5ML syrup Commonly known as:  ROBITUSSIN Take 200 mg by mouth 3 (three) times daily as needed for cough.   hydrALAZINE 100 MG tablet Commonly known as:  APRESOLINE Take 100 mg by mouth every 8 (eight) hours.   ipratropium-albuterol 0.5-2.5 (3) MG/3ML Soln Commonly known as:  DUONEB Take 3 mLs by nebulization 3 (three) times daily as needed (SOb).   lurasidone 80 MG Tabs tablet Commonly known as:  LATUDA Take 1 tablet (80 mg total) by mouth daily with breakfast.   magnesium hydroxide 400 MG/5ML suspension Commonly known as:  MILK OF MAGNESIA Take 30 mLs by mouth at bedtime as needed for mild constipation.   magnesium oxide 400 (241.3 Mg) MG tablet Commonly known as:  MAG-OX Take 2 tablets (800 mg total) by mouth 2 (two) times daily.   Melatonin 10 MG Tabs Take 10 mg by mouth at bedtime.   [place] 200-200-20 MG/5ML suspension Generic drug:  alum & mag hydroxide-simeth Take 30 mLs by mouth every 6 (six) hours as needed for indigestion or heartburn.   neomycin-bacitracin-polymyxin Oint Commonly known as:  NEOSPORIN Apply 1  application topically daily as needed for wound care.   NIFEdipine 90 MG 24 hr tablet Commonly known as:  PROCARDIA XL/ADALAT-CC Take 90 mg by mouth daily.   omeprazole 20 MG capsule Commonly known as:  PRILOSEC Take 1 capsule (20 mg total) by mouth daily. For acid reflux. What changed:    when to take this  additional instructions   oxyCODONE-acetaminophen 5-325 MG tablet Commonly known as:  PERCOCET/ROXICET Take 1 tablet by mouth every 4 (four) hours as needed for moderate pain.   PARoxetine 20 MG tablet Commonly known as:  PAXIL Take 2 tablets (40 mg total) by mouth daily.   polyethylene glycol packet Commonly known as:  MIRALAX Take 17 g by mouth daily.   pravastatin 20 MG tablet Commonly known as:  PRAVACHOL Take 20 mg by mouth daily.   promethazine 25 MG tablet Commonly known as:  PHENERGAN Take 25 mg by  mouth every 6 (six) hours as needed for nausea or vomiting.   senna 8.6 MG Tabs tablet Commonly known as:  SENOKOT Take 1 tablet (8.6 mg total) by mouth daily.   sodium chloride 1 g tablet Take 1 g by mouth 3 (three) times daily with meals.   sucralfate 1 g tablet Commonly known as:  CARAFATE Take 1 g by mouth 4 (four) times daily.   tiotropium 18 MCG inhalation capsule Commonly known as:  SPIRIVA Place 18 mcg into inhaler and inhale daily.      Allergies  Allergen Reactions  . Clarithromycin Itching  . Penicillins Itching   Contact information for after-discharge care    [facility] .   Service:  Skilled Nursing Contact information: [address] [place] [phone]               The results of significant diagnostics from this hospitalization (including imaging, microbiology, ancillary and laboratory) are listed below for reference.    Significant Diagnostic Studies: Dg Chest 2 View  Result Date: 07/30/2017 CLINICAL DATA:  Shortness of breath and chest tightness. EXAM:  CHEST  2 VIEW COMPARISON:  July 25, 2017 FINDINGS: Stable cardiomegaly. The hila, mediastinum, lungs, and pleura are otherwise normal. Anterior wedging of multiple midthoracic vertebral bodies is stable. IMPRESSION: No active cardiopulmonary disease. Electronically Signed   By: Dorise Bullion III M.D   On: 07/30/2017 20:42   Dg Chest 2 View  Result Date: 07/25/2017 CLINICAL DATA:  Chest pain and sob today, HTN, DM, COPD, CHF, smoker. Pt on 2L O2 at home EXAM: CHEST  2 VIEW COMPARISON:  Chest x-ray dated 07/17/2017, chest x-ray dated 05/30/2012. FINDINGS: Stable mild cardiomegaly. Aortic atherosclerosis. Lungs are clear. No pleural effusion or pneumothorax seen. No acute or suspicious osseous finding. IMPRESSION: No active cardiopulmonary disease. No evidence of pneumonia or pulmonary edema. Stable mild cardiomegaly. Electronically Signed   By: Franki Cabot M.D.   On: 07/25/2017 02:18   Dg Abd 1 View  Result Date: 08/09/2017 CLINICAL DATA:  Abdominal pain EXAM: ABDOMEN - 1 VIEW COMPARISON:  August 07, 2017 FINDINGS: There remain loops of dilated small bowel. There is air in the rectum and colon. No appreciable air-fluid levels. No free air. There are surgical clips in the right upper quadrant. The patient has reportedly undergone vertebroplasty procedures in the sacroiliac joint regions. IMPRESSION: Persistent small bowel dilatation. Question ileus versus a degree of partial obstruction. Note that there is air in the colon and rectum. No free air evident. Electronically Signed   By: Lowella Grip III M.D.   On: 08/09/2017 08:38   Dg Abdomen 1 View  Result Date: 07/31/2017 CLINICAL DATA:  Constipation EXAM: ABDOMEN - 1 VIEW COMPARISON:  CT 07/25/2017 FINDINGS: Visible lung bases are clear. Surgical clips in the right upper quadrant. Nonobstructed gas pattern with large amount of stool in the colon. Radiopaque material over the sacrum. No abnormal calcification IMPRESSION: Nonobstructed gas  pattern with large amount of stool in the colon. Electronically Signed   By: Donavan Foil M.D.   On: 07/31/2017 02:37   Ct Head Wo Contrast  Result Date: 07/31/2017 CLINICAL DATA:  Status post fall onto right side, with right-sided head and neck pain. EXAM: CT HEAD WITHOUT CONTRAST CT CERVICAL SPINE WITHOUT CONTRAST TECHNIQUE: Multidetector CT imaging of the head and cervical spine was performed following the standard protocol without intravenous contrast. Multiplanar CT image reconstructions of the cervical spine were  also generated. COMPARISON:  None. FINDINGS: CT HEAD FINDINGS Brain: No evidence of acute infarction, hemorrhage, hydrocephalus, extra-axial collection or mass lesion/mass effect. The posterior fossa, including the cerebellum, brainstem and fourth ventricle, is within normal limits. The third and lateral ventricles, and basal ganglia are unremarkable in appearance. The cerebral hemispheres are symmetric in appearance, with normal gray-white differentiation. No mass effect or midline shift is seen. Vascular: No hyperdense vessel or unexpected calcification. Skull: There is no evidence of fracture; visualized osseous structures are unremarkable in appearance. Sinuses/Orbits: The orbits are within normal limits. There is complete opacification of the mastoid air cells bilaterally, with trace fluid tracking about the middle ears bilaterally. The paranasal sinuses are well-aerated. Other: No significant soft tissue abnormalities are seen. CT CERVICAL SPINE FINDINGS Alignment: Normal. Skull base and vertebrae: No acute fracture. No primary bone lesion or focal pathologic process. Soft tissues and spinal canal: No prevertebral fluid or swelling. No visible canal hematoma. Disc levels: Intervertebral disc spaces are preserved. The bony foramina are grossly unremarkable in appearance. Upper chest: The visualized lung apices are clear. The visualized portions of the thyroid gland are grossly unremarkable.  Scattered calcification is noted at the carotid bifurcations bilaterally. Other: No additional soft tissue abnormalities are seen. IMPRESSION: 1. No evidence of traumatic intracranial injury or fracture. 2. No evidence of fracture or subluxation along the cervical spine. 3. Complete opacification of the mastoid air cells bilaterally, with trace fluid tracking about the middle ears bilaterally. Would correlate clinically for any evidence of ear infection. 4. Scattered calcification at the carotid bifurcations bilaterally. Carotid ultrasound would be helpful for further evaluation, when and as deemed clinically appropriate. Electronically Signed   By: Garald Balding M.D.   On: 07/31/2017 02:54   Ct Cervical Spine Wo Contrast  Result Date: 07/31/2017 CLINICAL DATA:  Status post fall onto right side, with right-sided head and neck pain. EXAM: CT HEAD WITHOUT CONTRAST CT CERVICAL SPINE WITHOUT CONTRAST TECHNIQUE: Multidetector CT imaging of the head and cervical spine was performed following the standard protocol without intravenous contrast. Multiplanar CT image reconstructions of the cervical spine were also generated. COMPARISON:  None. FINDINGS: CT HEAD FINDINGS Brain: No evidence of acute infarction, hemorrhage, hydrocephalus, extra-axial collection or mass lesion/mass effect. The posterior fossa, including the cerebellum, brainstem and fourth ventricle, is within normal limits. The third and lateral ventricles, and basal ganglia are unremarkable in appearance. The cerebral hemispheres are symmetric in appearance, with normal gray-white differentiation. No mass effect or midline shift is seen. Vascular: No hyperdense vessel or unexpected calcification. Skull: There is no evidence of fracture; visualized osseous structures are unremarkable in appearance. Sinuses/Orbits: The orbits are within normal limits. There is complete opacification of the mastoid air cells bilaterally, with trace fluid tracking about the  middle ears bilaterally. The paranasal sinuses are well-aerated. Other: No significant soft tissue abnormalities are seen. CT CERVICAL SPINE FINDINGS Alignment: Normal. Skull base and vertebrae: No acute fracture. No primary bone lesion or focal pathologic process. Soft tissues and spinal canal: No prevertebral fluid or swelling. No visible canal hematoma. Disc levels: Intervertebral disc spaces are preserved. The bony foramina are grossly unremarkable in appearance. Upper chest: The visualized lung apices are clear. The visualized portions of the thyroid gland are grossly unremarkable. Scattered calcification is noted at the carotid bifurcations bilaterally. Other: No additional soft tissue abnormalities are seen. IMPRESSION: 1. No evidence of traumatic intracranial injury or fracture. 2. No evidence of fracture or subluxation along the cervical spine. 3. Complete opacification  of the mastoid air cells bilaterally, with trace fluid tracking about the middle ears bilaterally. Would correlate clinically for any evidence of ear infection. 4. Scattered calcification at the carotid bifurcations bilaterally. Carotid ultrasound would be helpful for further evaluation, when and as deemed clinically appropriate. Electronically Signed   By: Garald Balding M.D.   On: 07/31/2017 02:54   Ct Abdomen Pelvis W Contrast  Result Date: 08/03/2017 CLINICAL DATA:  Acute abdominal and pelvic pain with nausea and vomiting today. EXAM: CT ABDOMEN AND PELVIS WITH CONTRAST TECHNIQUE: Multidetector CT imaging of the abdomen and pelvis was performed using the standard protocol following bolus administration of intravenous contrast. CONTRAST:  173mL ISOVUE-300 IOPAMIDOL (ISOVUE-300) INJECTION 61% COMPARISON:  07/25/2017 CT and prior studies FINDINGS: Lower chest: Trace bilateral pleural effusions and minimal bibasilar atelectasis noted. Mild cardiomegaly is present. Hepatobiliary: The liver is unremarkable. The patient is status post  cholecystectomy. There is no evidence of biliary dilatation. Pancreas: Unremarkable Spleen: Unremarkable Adrenals/Urinary Tract: The kidneys, adrenal glands and bladder are unremarkable. Stomach/Bowel: There is a high-grade small bowel obstruction with transition point within the lower right abdomen (image 3 series 55 and series 7 image 87). No obstructing cause is identified and this may be secondary to an adhesion. There is no evidence of pneumoperitoneum or abscess. A small umbilical hernia containing a loop of nondistended small bowel is present but this is not the site of bowel obstruction. Vascular/Lymphatic: Aortic atherosclerosis. No enlarged abdominal or pelvic lymph nodes. Reproductive: Unremarkable Other: A trace amount of free fluid within the pelvis is noted. Diffuse subcutaneous edema is present. Musculoskeletal: No acute or suspicious bony abnormalities noted. Lower thoracic and L2 compression fractures are again noted. IMPRESSION: 1. High-grade small bowel obstruction with transition point within the lower right abdomen. No obstructing cause identified and this may be secondary to an adhesion. 2. Small bilateral pleural effusions, subcutaneous edema and small amount of free pelvic fluid. 3. Small umbilical hernia containing a loop of nondistended small bowel, but not the site of bowel obstruction. 4.  Aortic Atherosclerosis (ICD10-I70.0). Electronically Signed   By: Margarette Canada M.D.   On: 08/03/2017 14:19   Ct Abdomen Pelvis W Contrast  Result Date: 07/25/2017 CLINICAL DATA:  Mid central abdominal pain into the chest for months. Previous hernia repair. Irritable bowel syndrome. Abdominal distention. EXAM: CT ABDOMEN AND PELVIS WITH CONTRAST TECHNIQUE: Multidetector CT imaging of the abdomen and pelvis was performed using the standard protocol following bolus administration of intravenous contrast. CONTRAST:  137mL ISOVUE-300 IOPAMIDOL (ISOVUE-300) INJECTION 61% COMPARISON:  06/04/2012 FINDINGS:  Lower chest: Atelectasis in the lung bases. Mild cardiac enlargement with small pericardial effusion. Hepatobiliary: No focal liver abnormality is seen. Status post cholecystectomy. No biliary dilatation. Pancreas: Unremarkable. No pancreatic ductal dilatation or surrounding inflammatory changes. Spleen: Normal in size without focal abnormality. Adrenals/Urinary Tract: No adrenal gland nodules. Scarring in the right kidney. Parenchymal stone in the upper pole associated with the area of scarring, unchanged since previous study. No hydronephrosis or hydroureter. No ureteral stones. Bladder is unremarkable. Stomach/Bowel: Stomach and small bowel are decompressed. Diffusely stool-filled colon without abnormal distention. No wall thickening or inflammatory changes. There is an infraumbilical midline abdominal wall hernia containing small bowel. No proximal obstruction. The appendix is not identified. Vascular/Lymphatic: Aortic atherosclerosis. Prominent atherosclerotic changes throughout the abdominal aorta with probable areas of significant stenosis in the abdominal aorta and bifurcation. Probable occlusion of the origin of the left common iliac artery with reconstitution in the external iliac. Severe calcification at  the origin of the superior mesenteric artery with flow demonstrated distally. No significant lymphadenopathy. Reproductive: Uterus and bilateral adnexa are unremarkable. Other: No free air or free fluid in the abdomen. Nodular infiltration in the left anterior abdominal wall fat likely representing injection sites. Musculoskeletal: Degenerative changes in the spine. Multiple lower thoracic and lumbar vertebral compression deformities, likely chronic. Old sacral fracture deformities. IMPRESSION: 1. Stone and scarring in the right kidney without change. No ureteral stone or obstruction. 2. Low midline abdominal wall hernia containing small bowel without proximal obstruction. 3. Prominent aortic  atherosclerosis with likely areas of significant stenosis in the abdominal aorta and iliac arteries. Probable occlusion of the origin of the left common iliac artery with reconstitution in the external iliac. 4. Multiple lower thoracic and lumbar vertebral compression deformities and sacral fracture deformity, likely old. Electronically Signed   By: Lucienne Capers M.D.   On: 07/25/2017 04:26   Ct Hip Right Wo Contrast  Result Date: 08/01/2017 CLINICAL DATA:  Status post fall 2 days ago.  Right hip pain. EXAM: CT OF THE RIGHT HIP WITHOUT CONTRAST TECHNIQUE: Multidetector CT imaging of the right hip was performed according to the standard protocol. Multiplanar CT image reconstructions were also generated. COMPARISON:  None. FINDINGS: Bones/Joint/Cartilage Nondisplaced fracture of the right superior pubic ramus. No other fracture or dislocation. Normal alignment. No joint effusion. Prior right sacroplasty. Severe bilateral facet arthropathy at L5-S1. Ligaments Ligaments are suboptimally evaluated by CT. Muscles and Tendons Muscles are normal.  No muscle atrophy. Soft tissue No fluid collection or hematoma. No soft tissue mass. Soft tissue contusion overlying the right greater trochanter. IMPRESSION: 1. Acute nondisplaced fracture of the right superior pubic ramus. Electronically Signed   By: Kathreen Devoid   On: 08/01/2017 15:03   Dg Abdomen Acute W/chest  Result Date: 07/17/2017 CLINICAL DATA:  Productive cough for 1 month. Nausea for 2 weeks. Left lower quadrant abdominal not and bruising. Decreased oxygen saturation. EXAM: DG ABDOMEN ACUTE W/ 1V CHEST COMPARISON:  Chest 07/05/2017 FINDINGS: Cardiac enlargement. No airspace disease or consolidation in the lungs. No blunting of costophrenic angles. No pneumothorax. Calcification of the aorta. Gas and stool throughout the colon. No small or large bowel distention. No free intra-abdominal air. No abnormal air-fluid levels. No radiopaque stones. Benign-appearing  calcification in the sacral ala. Surgical clips in the right upper quadrant. Degenerative changes in the lumbar spine. Vascular calcifications. IMPRESSION: Cardiac enlargement.  No evidence of active pulmonary disease. Nonobstructive bowel gas pattern with stool-filled colon. Aortic atherosclerosis. Electronically Signed   By: Lucienne Capers M.D.   On: 07/17/2017 01:12   Dg Abd Portable 1v  Result Date: 08/05/2017 CLINICAL DATA:  Small bowel obstruction, nasogastric tube. EXAM: PORTABLE ABDOMEN - 1 VIEW COMPARISON:  08/04/2017. FINDINGS: Slight improvement in gaseous distention of small bowel without resolution. Retained oral contrast in the colon. Nasogastric tube terminates in the stomach with the side port in the region of the gastroesophageal junction. IMPRESSION: Slight improvement in gaseous distension of small bowel, without resolution. Electronically Signed   By: Lorin Picket M.D.   On: 08/05/2017 09:34   Dg Abd Portable 1v-small Bowel Obstruction Protocol-initial, 8 Hr Delay  Result Date: 08/04/2017 CLINICAL DATA:  Small bowel obstruction 8 hour delay EXAM: PORTABLE ABDOMEN - 1 VIEW COMPARISON:  08/03/2017, CT 08/03/2017 FINDINGS: Esophageal tube tip projects over the proximal stomach, side-port is in the region of GE junction. Retained contrast in the gastric fundus. Surgical clips in the right upper quadrant. Residual contrast in  the urinary bladder. Persistent gaseous enlargement of small bowel loops measuring up to 4.6 cm, consistent with bowel obstruction. No significant contrast within the colon. IMPRESSION: 1. Persistent dilated loops of small bowel, consistent with bowel obstruction. 2. Retained contrast within the stomach. Residual contrast noted in the bladder. Electronically Signed   By: Donavan Foil M.D.   On: 08/04/2017 02:56   Dg Abd Portable 1v  Result Date: 08/03/2017 CLINICAL DATA:  NG tube placement. EXAM: PORTABLE ABDOMEN - 1 VIEW COMPARISON:  CT, 08/03/2017 at 12:56 p.m.  FINDINGS: Nasogastric tube passes below the diaphragm. Tip lies in the mid stomach. Dilated loops of small bowel are noted consistent with a partial obstruction as noted on the current CT. IMPRESSION: 1. Well-positioned nasogastric tube. Electronically Signed   By: Lajean Manes M.D.   On: 08/03/2017 17:12   Dg Abd Portable 2v  Result Date: 08/07/2017 CLINICAL DATA:  Follow-up small bowel obstruction. EXAM: PORTABLE ABDOMEN - 2 VIEW COMPARISON:  Abdominal radiographs of August 05, 2017 FINDINGS: There are loops of mildly distended gas-filled small bowel stacked in the mid abdomen. There is contrast and gas within normal caliber colonic loops. A small amount of gas is present in the rectosigmoid. There surgical clips in the gallbladder fossa. There are degenerative changes of the lumbar spine with a compression fracture of L2. The patient has undergone sclerosing therapy for pelvic fractures. IMPRESSION: Persistent partial distal small bowel obstruction. No evidence of perforation. Electronically Signed   By: David  Martinique M.D.   On: 08/07/2017 12:36   Dg Hip Unilat With Pelvis 2-3 Views Right  Result Date: 07/31/2017 CLINICAL DATA:  Fall with right-sided pain EXAM: DG HIP (WITH OR WITHOUT PELVIS) 2-3V RIGHT COMPARISON:  CT 07/25/2017 FINDINGS: Radiopaque material at the sacrum. Pubic symphysis and rami appear intact. There is no acute displaced fracture or malalignment. IMPRESSION: No definite acute osseous abnormality. Electronically Signed   By: Donavan Foil M.D.   On: 07/31/2017 02:37    Microbiology: No results found for this or any previous visit (from the past 240 hour(s)).   Labs: Basic Metabolic Panel: Recent Labs  Lab 08/08/17 0635 08/09/17 0549 08/10/17 0546 08/11/17 0800 08/12/17 0555  NA 129* 125* 131* 128* 131*  K 3.5 3.5 3.9 4.2 4.3  CL 87* 85* 89* 90* 91*  CO2 32 29 30 29 30   GLUCOSE 80 77 96 95 91  BUN 5* 9 9 7  5*  CREATININE 0.43* 0.51 0.55 0.41* 0.50  CALCIUM 8.5*  8.4* 8.6* 8.5* 8.6*  MG 1.8 1.7 2.0 1.9 1.9   Liver Function Tests: Recent Labs  Lab 08/08/17 0635 08/10/17 0546  AST 17 9*  ALT 24 14  ALKPHOS 49 53  BILITOT 0.4 0.4  PROT 5.3* 5.4*  ALBUMIN 2.6* 2.6*   No results for input(s): LIPASE, AMYLASE in the last 168 hours. No results for input(s): AMMONIA in the last 168 hours. CBC: Recent Labs  Lab 08/06/17 0455 08/07/17 0600 08/08/17 0635 08/09/17 0549 08/12/17 0555  WBC 10.1 9.1 9.6 11.1* 9.2  NEUTROABS  --   --  5.0 6.3 6.3  HGB 11.0* 11.3* 11.3* 11.3* 9.8*  HCT 32.6* 33.2* 33.1* 33.5* 30.0*  MCV 88.3 87.4 88.0 89.6 92.3  PLT 218 230 215 240 205   Cardiac Enzymes: No results for input(s): CKTOTAL, CKMB, CKMBINDEX, TROPONINI in the last 168 hours. BNP: BNP (last 3 results) Recent Labs    07/31/17 0346  BNP 88.1    ProBNP (last 3 results)  No results for input(s): PROBNP in the last 8760 hours.  CBG: Recent Labs  Lab 08/10/17 2105 08/11/17 0727 08/11/17 1110 08/11/17 1614 08/11/17 2138  GLUCAP 131* 92 108* 122* 100*       Signed:  Nita Sells MD   [facility] 08/12/2017, 10:56 AM

## 2017-08-12 NOTE — Clinical Social Work Note (Signed)
Clinical Social Worker facilitated patient discharge including contacting patient family and facility to confirm patient discharge plans.  Clinical information faxed to facility and family agreeable with plan.  CSW arranged ambulance transport via [facility] to [facility].  RN to call [phone] for report prior to discharge.  Clinical Social Worker will sign off for now as social work intervention is no longer needed. Please consult [place] again if new need arises.  [place], [place]

## 2017-08-12 NOTE — Clinical Social Work Placement (Signed)
   CLINICAL SOCIAL WORK PLACEMENT  NOTE  Date:  08/12/2017  Patient Details  Name: Natalie Thomas MRN: [id number] Date of Birth: 1960-07-30  Clinical Social Work is seeking post-discharge placement for this patient at the [place] level of care (*CSW will initial, date and re-position this form in  chart as items are completed):      Patient/family provided with [place] Work Department's list of facilities offering this level of care within the geographic area requested by the patient (or if unable, by the patient's family).  Yes   Patient/family informed of their freedom to choose among providers that offer the needed level of care, that participate in Medicare, Medicaid or managed care program needed by the patient, have an available bed and are willing to accept the patient.      Patient/family informed of [facility]'s ownership interest in [facility] and [facility], as well as of the fact that they are under no obligation to receive care at these facilities.  PASRR submitted to EDS on       PASRR number received on 08/12/17     Existing PASRR number confirmed on       FL2 transmitted to all facilities in geographic area requested by pt/family on 08/12/17     FL2 transmitted to all facilities within larger geographic area on       Patient informed that his/her managed care company has contracts with or will negotiate with certain facilities, including the following:        Yes   Patient/family informed of bed offers received.  Patient chooses bed at [facility]     Physician recommends and patient chooses bed at      Patient to be transferred to [facility] on 08/12/17.  Patient to be transferred to facility by [facility]     Patient family notified on 08/12/17 of transfer.  Name of family member notified:  Clifton James (Legal Guardian)     PHYSICIAN       Additional Comment:     _______________________________________________ Eileen Stanford, LCSW 08/12/2017, 11:39 AM

## 2017-08-13 LAB — GLUCOSE, CAPILLARY
GLUCOSE-CAPILLARY: 94 mg/dL (ref 65–99)
Glucose-Capillary: 158 mg/dL — ABNORMAL HIGH (ref 65–99)

## 2017-10-06 ENCOUNTER — Inpatient Hospital Stay (HOSPITAL_COMMUNITY)
Admission: EM | Admit: 2017-10-06 | Discharge: 2017-10-08 | DRG: 190 | Disposition: A | Discharge status: Nursing Home (Includes ICF) | Payer: Medicare Other | Attending: Internal Medicine | Admitting: Internal Medicine

## 2017-10-06 ENCOUNTER — Emergency Department (HOSPITAL_COMMUNITY): Payer: Medicare Other

## 2017-10-06 ENCOUNTER — Encounter (HOSPITAL_COMMUNITY): Payer: Self-pay | Admitting: Nurse Practitioner

## 2017-10-06 ENCOUNTER — Other Ambulatory Visit: Payer: Self-pay

## 2017-10-06 DIAGNOSIS — I10 Essential (primary) hypertension: Secondary | ICD-10-CM | POA: Diagnosis not present

## 2017-10-06 DIAGNOSIS — Z794 Long term (current) use of insulin: Secondary | ICD-10-CM

## 2017-10-06 DIAGNOSIS — Z7951 Long term (current) use of inhaled steroids: Secondary | ICD-10-CM

## 2017-10-06 DIAGNOSIS — F419 Anxiety disorder, unspecified: Secondary | ICD-10-CM | POA: Diagnosis present

## 2017-10-06 DIAGNOSIS — R1013 Epigastric pain: Secondary | ICD-10-CM | POA: Diagnosis present

## 2017-10-06 DIAGNOSIS — J441 Chronic obstructive pulmonary disease with (acute) exacerbation: Secondary | ICD-10-CM | POA: Diagnosis present

## 2017-10-06 DIAGNOSIS — F250 Schizoaffective disorder, bipolar type: Secondary | ICD-10-CM | POA: Diagnosis not present

## 2017-10-06 DIAGNOSIS — E119 Type 2 diabetes mellitus without complications: Secondary | ICD-10-CM | POA: Diagnosis not present

## 2017-10-06 DIAGNOSIS — J9611 Chronic respiratory failure with hypoxia: Secondary | ICD-10-CM | POA: Diagnosis present

## 2017-10-06 DIAGNOSIS — Z9981 Dependence on supplemental oxygen: Secondary | ICD-10-CM | POA: Diagnosis not present

## 2017-10-06 DIAGNOSIS — K219 Gastro-esophageal reflux disease without esophagitis: Secondary | ICD-10-CM | POA: Diagnosis not present

## 2017-10-06 DIAGNOSIS — J9621 Acute and chronic respiratory failure with hypoxia: Secondary | ICD-10-CM | POA: Diagnosis not present

## 2017-10-06 DIAGNOSIS — E785 Hyperlipidemia, unspecified: Secondary | ICD-10-CM | POA: Diagnosis present

## 2017-10-06 DIAGNOSIS — F17210 Nicotine dependence, cigarettes, uncomplicated: Secondary | ICD-10-CM | POA: Diagnosis present

## 2017-10-06 DIAGNOSIS — E871 Hypo-osmolality and hyponatremia: Secondary | ICD-10-CM | POA: Diagnosis not present

## 2017-10-06 DIAGNOSIS — Z7982 Long term (current) use of aspirin: Secondary | ICD-10-CM

## 2017-10-06 DIAGNOSIS — M810 Age-related osteoporosis without current pathological fracture: Secondary | ICD-10-CM | POA: Diagnosis present

## 2017-10-06 DIAGNOSIS — Z79899 Other long term (current) drug therapy: Secondary | ICD-10-CM | POA: Diagnosis not present

## 2017-10-06 DIAGNOSIS — J9622 Acute and chronic respiratory failure with hypercapnia: Secondary | ICD-10-CM | POA: Diagnosis not present

## 2017-10-06 LAB — COMPREHENSIVE METABOLIC PANEL
ALK PHOS: 79 U/L (ref 38–126)
ALT: 19 U/L (ref 14–54)
AST: 16 U/L (ref 15–41)
Albumin: 4 g/dL (ref 3.5–5.0)
Anion gap: 12 (ref 5–15)
BILIRUBIN TOTAL: 0.6 mg/dL (ref 0.3–1.2)
BUN: 8 mg/dL (ref 6–20)
CALCIUM: 9.5 mg/dL (ref 8.9–10.3)
CHLORIDE: 89 mmol/L — AB (ref 101–111)
CO2: 30 mmol/L (ref 22–32)
Creatinine, Ser: 0.47 mg/dL (ref 0.44–1.00)
Glucose, Bld: 93 mg/dL (ref 65–99)
Potassium: 3.7 mmol/L (ref 3.5–5.1)
Sodium: 131 mmol/L — ABNORMAL LOW (ref 135–145)
TOTAL PROTEIN: 7.8 g/dL (ref 6.5–8.1)

## 2017-10-06 LAB — CBC WITH DIFFERENTIAL/PLATELET
Basophils Absolute: 0 10*3/uL (ref 0.0–0.1)
Basophils Relative: 0 %
Eosinophils Absolute: 0 10*3/uL (ref 0.0–0.7)
Eosinophils Relative: 0 %
HEMATOCRIT: 40.5 % (ref 36.0–46.0)
Hemoglobin: 13.3 g/dL (ref 12.0–15.0)
LYMPHS ABS: 2.5 10*3/uL (ref 0.7–4.0)
LYMPHS PCT: 25 %
MCH: 29.6 pg (ref 26.0–34.0)
MCHC: 32.8 g/dL (ref 30.0–36.0)
MCV: 90.2 fL (ref 78.0–100.0)
Monocytes Absolute: 0.7 10*3/uL (ref 0.1–1.0)
Monocytes Relative: 7 %
NEUTROS ABS: 6.8 10*3/uL (ref 1.7–7.7)
Neutrophils Relative %: 68 %
Platelets: 222 10*3/uL (ref 150–400)
RBC: 4.49 MIL/uL (ref 3.87–5.11)
RDW: 15 % (ref 11.5–15.5)
WBC: 10 10*3/uL (ref 4.0–10.5)

## 2017-10-06 LAB — I-STAT CG4 LACTIC ACID, ED: LACTIC ACID, VENOUS: 0.61 mmol/L (ref 0.5–1.9)

## 2017-10-06 LAB — LIPASE, BLOOD: LIPASE: 34 U/L (ref 11–51)

## 2017-10-06 LAB — BRAIN NATRIURETIC PEPTIDE: B Natriuretic Peptide: 87.6 pg/mL (ref 0.0–100.0)

## 2017-10-06 LAB — TROPONIN I: Troponin I: <0.03 ng/mL (ref <0.03)

## 2017-10-06 MED ORDER — IOPAMIDOL (ISOVUE-300) INJECTION 61%
INTRAVENOUS | Status: AC
  Filled 2017-10-06: qty 100

## 2017-10-06 MED ORDER — METHYLPREDNISOLONE SODIUM SUCC 125 MG IJ SOLR
125.0000 mg | Freq: Once | INTRAMUSCULAR | Status: AC
  Administered 2017-10-06: 125 mg via INTRAVENOUS
  Filled 2017-10-06: qty 2

## 2017-10-06 MED ORDER — SODIUM CHLORIDE 0.9 % IJ SOLN
INTRAMUSCULAR | Status: AC
  Filled 2017-10-06: qty 50

## 2017-10-06 MED ORDER — IPRATROPIUM BROMIDE 0.02 % IN SOLN: 0.5000 mg | Freq: Once | RESPIRATORY_TRACT | Status: DC

## 2017-10-06 MED ORDER — SODIUM CHLORIDE 0.9 % IV SOLN
Freq: Once | INTRAVENOUS | Status: AC
  Administered 2017-10-06: 21:00:00 via INTRAVENOUS

## 2017-10-06 MED ORDER — FENTANYL CITRATE (PF) 100 MCG/2ML IJ SOLN
50.0000 ug | Freq: Once | INTRAMUSCULAR | Status: AC
  Administered 2017-10-06: 50 ug via INTRAVENOUS
  Filled 2017-10-06: qty 2

## 2017-10-06 MED ORDER — IPRATROPIUM BROMIDE 0.02 % IN SOLN
1.0000 mg | Freq: Once | RESPIRATORY_TRACT | Status: AC
  Administered 2017-10-06: 1 mg via RESPIRATORY_TRACT
  Filled 2017-10-06: qty 5

## 2017-10-06 MED ORDER — ALBUTEROL (5 MG/ML) CONTINUOUS INHALATION SOLN
10.0000 mg/h | INHALATION_SOLUTION | Freq: Once | RESPIRATORY_TRACT | Status: AC
Start: 2017-10-06 — End: 2017-10-06
  Administered 2017-10-06: 10 mg/h via RESPIRATORY_TRACT
  Filled 2017-10-06: qty 20

## 2017-10-06 MED ORDER — IOPAMIDOL (ISOVUE-300) INJECTION 61%
100.0000 mL | Freq: Once | INTRAVENOUS | Status: AC | PRN
  Administered 2017-10-06: 100 mL via INTRAVENOUS

## 2017-10-06 MED ORDER — SODIUM CHLORIDE 0.9 % IV SOLN
100.0000 mg | Freq: Once | INTRAVENOUS | Status: AC
  Administered 2017-10-07: 100 mg via INTRAVENOUS
  Filled 2017-10-06: qty 100

## 2017-10-06 NOTE — ED Triage Notes (Signed)
Pt is presented from [facility], c/o shortness of breath, productive cough, epigastric abdominal pain, nausea and urinary frequency.

## 2017-10-06 NOTE — ED Provider Notes (Signed)
[facility] Provider Note   CSN: [id number] Arrival date & time: 10/06/17  2028     History   Chief Complaint Chief Complaint  Patient presents with  . Shortness of Breath  . Abdominal Pain    HPI Natalie Thomas is a 57 y.o. female.  HPI   57 year old female with past medical history of CHF, COPD, diabetes, hypertension, here with shortness of breath.  The patient states that over the last 2 weeks, she is had progressive worsening cough.  This cough is productive of yellow sputum.  She had wheezing and increased shortness of breath.  Over the last 48 hours, she is now developed diffuse abdominal pain and distention.  She describes the pain is an aching, throbbing, generalized pain but primarily in her upper abdomen.  She said associated mild dysuria.  She is also been constipated and is no longer passing flatus last 24 hours.  She has had progressively worsening fatigue as well.  No fevers.  No other complaints.  No known sick contacts.  Denies any known fevers.  She has had mild improvement with her breathing treatment at home.  No worsening factors.  Past Medical History:  Diagnosis Date  . CHF (congestive heart failure) (Natalie Thomas)   . COPD (chronic obstructive pulmonary disease) (Natalie Thomas)   . Depression   . Diabetes mellitus, type 2 (Natalie Thomas)    pt denies but states that she has been treated for DM  . GERD (gastroesophageal reflux disease)   . Hypertension   . Osteoporosis     Patient Active Problem List   Diagnosis Date Noted  . COPD exacerbation (Natalie Thomas) 10/07/2017  . Incontinence of urine 08/14/2011  . Hyponatremia 07/27/2011  . Schizoaffective disorder, bipolar type (Natalie Thomas) 07/05/2011  . Cough 05/08/2011  . Encounter for long-term (current) use of other medications 04/09/2011  . Lumbar disc disease 04/09/2011  . Polycythemia secondary to smoking 04/09/2011  . HIP PAIN, LEFT 12/12/2007  . 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  DIABETES MELLITUS, TYPE II 04/08/2007  . MENOPAUSE, PREMATURE 04/08/2007  . HYPONATREMIA 04/08/2007  . SCHIZOAFFECTIVE DISORDER 04/08/2007  . DEPRESSION 04/08/2007  . HYPERTENSION 04/08/2007  . COPD 04/08/2007  . GERD 04/08/2007  . OSTEOPOROSIS 04/08/2007  . DYSURIA 04/08/2007    Past Surgical History:  Procedure Laterality Date  . EYE SURGERY     on left  eye, pt states that she sees bad out of the right eye and needs surgery there  . PELVIC FRACTURE SURGERY       OB History   None      Home Medications    Prior to Admission medications   Medication Sig Start Date End Date Taking? Authorizing Provider  acetaminophen (TYLENOL) 325 MG tablet Take 650 mg by mouth every 6 (six) hours as needed for mild pain.   Yes [provider]  albuterol (PROVENTIL HFA;VENTOLIN HFA) 108 (90 Base) MCG/ACT inhaler Inhale 2 puffs into the lungs every 4 (four) hours as needed for wheezing or shortness of breath. 07/05/17  Yes Ward, Delice Bison, DO  ALPRAZolam (XANAX) 0.5 MG tablet Take 1 tablet (0.5 mg total) by mouth 3 (three) times daily. 08/12/17  Yes Nita Sells, MD  alum & mag hydroxide-simeth ([place]) 200-200-20 MG/5ML suspension Take 30 mLs by mouth every 6 (six) hours as needed for indigestion or heartburn.   Yes [provider]  aspirin 81 MG tablet Take 1 tablet (81 mg total) by mouth daily. For heart health 08/06/11  Yes Greig Castilla, FNP  carvedilol (COREG) 25 MG tablet Take 25 mg by mouth 2 (two) times daily with a meal.   Yes [provider]  cloNIDine (CATAPRES - DOSED IN MG/24 HR) 0.3 mg/24hr patch Place 0.3 mg onto the skin once a week.   Yes [provider]  dextromethorphan-guaiFENesin (MUCINEX DM) 30-600 MG 12hr tablet Take 1 tablet by mouth every 12 (twelve) hours as needed for cough.   Yes [provider]  divalproex (DEPAKOTE) 250 MG DR tablet Take 1 tablet (250 mg total) by mouth every 12 (twelve) hours. 08/12/17  Yes  Nita Sells, MD  ferrous sulfate 325 (65 FE) MG tablet Take 325 mg by mouth daily with breakfast.   Yes [provider]  fluticasone (FLONASE) 50 MCG/ACT nasal spray Place 2 sprays into both nostrils 2 (two) times daily.   Yes [provider]  fluticasone furoate-vilanterol (BREO ELLIPTA) 100-25 MCG/INH AEPB Inhale 1 puff into the lungs daily.   Yes [provider]  furosemide (LASIX) 40 MG tablet Take 1 tablet (40 mg total) by mouth daily. 08/13/17  Yes Nita Sells, MD  guaifenesin (ROBITUSSIN) 100 MG/5ML syrup Take 200 mg by mouth 3 (three) times daily as needed for cough.   Yes [provider]  hydrALAZINE (APRESOLINE) 100 MG tablet Take 100 mg by mouth every 8 (eight) hours.   Yes [provider]  ipratropium-albuterol (DUONEB) 0.5-2.5 (3) MG/3ML SOLN Take 3 mLs by nebulization 3 (three) times daily as needed (SOb).   Yes [provider]  loperamide (IMODIUM) 2 MG capsule Take 2 mg by mouth as needed for diarrhea or loose stools.   Yes [provider]  lurasidone (LATUDA) 80 MG TABS tablet Take 1 tablet (80 mg total) by mouth daily with breakfast. 08/12/17  Yes Nita Sells, MD  magnesium hydroxide (MILK OF MAGNESIA) 400 MG/5ML suspension Take 30 mLs by mouth at bedtime as needed for mild constipation.   Yes [provider]  magnesium oxide (MAG-OX) 400 (241.3 Mg) MG tablet Take 2 tablets (800 mg total) by mouth 2 (two) times daily. 08/12/17  Yes Nita Sells, MD  Melatonin 10 MG TABS Take 10 mg by mouth at bedtime.   Yes [provider]  Multiple Vitamin (DAILY-VITE PO) Take 1 tablet by mouth daily.   Yes [provider]  neomycin-bacitracin-polymyxin (NEOSPORIN) OINT Apply 1 application topically daily as needed for wound care.   Yes [provider]  NIFEdipine (PROCARDIA XL/ADALAT-CC) 90 MG 24 hr tablet Take 90 mg by mouth daily.   Yes [provider]    omeprazole (PRILOSEC) 20 MG capsule Take 1 capsule (20 mg total) by mouth daily. For acid reflux. Patient taking differently: Take 20 mg by mouth 2 (two) times daily before a meal. For acid reflux. 08/06/11  Yes Greig Castilla, FNP  oxyCODONE (OXY IR/ROXICODONE) 5 MG immediate release tablet Take 5 mg by mouth every 6 (six) hours as needed for severe pain.   Yes [provider]  PARoxetine (PAXIL) 20 MG tablet Take 2 tablets (40 mg total) by mouth daily. Patient taking differently: Take 20 mg by mouth daily.  08/12/17  Yes Nita Sells, MD  pravastatin (PRAVACHOL) 20 MG tablet Take 20 mg by mouth daily.   Yes [provider]  predniSONE (DELTASONE) 10 MG tablet Take 10 mg by mouth daily with breakfast.   Yes [provider]  promethazine (PHENERGAN) 25 MG tablet Take 25 mg by mouth every 6 (  six) hours as needed for nausea or vomiting.   Yes [provider]  sodium chloride 1 g tablet Take 1 g by mouth 3 (three) times daily with meals.    Yes [provider]  sucralfate (CARAFATE) 1 g tablet Take 1 g by mouth 4 (four) times daily.   Yes [provider]  buPROPion (WELLBUTRIN XL) 150 MG 24 hr tablet Take 1 tablet (150 mg total) by mouth daily. Patient not taking: Reported on 10/06/2017 08/13/17   Nita Sells, MD  oxyCODONE-acetaminophen (PERCOCET/ROXICET) 5-325 MG tablet Take 1 tablet by mouth every 4 (four) hours as needed for moderate pain. Patient not taking: Reported on 10/06/2017 08/12/17   Nita Sells, MD  polyethylene glycol [facility]) packet Take 17 g by mouth daily. Patient not taking: Reported on 10/06/2017 07/17/17   Horton, Barbette Hair, MD  senna (SENOKOT) 8.6 MG TABS tablet Take 1 tablet (8.6 mg total) by mouth daily. Patient not taking: Reported on 10/06/2017 08/12/17   Nita Sells, MD    Family History History reviewed. No pertinent family history.  Social History Social History   Tobacco Use  .  Smoking status: Current Every Day Smoker    Packs/day: 0.25    Types: Cigarettes  . Smokeless tobacco: Never Used  Substance Use Topics  . Alcohol use: No  . Drug use: No     Allergies   Clarithromycin and Penicillins   Review of Systems Review of Systems  Constitutional: Positive for fatigue. Negative for chills and fever.  HENT: Negative for congestion and rhinorrhea.   Eyes: Negative for visual disturbance.  Respiratory: Positive for cough and shortness of breath. Negative for wheezing.   Cardiovascular: Negative for chest pain and leg swelling.  Gastrointestinal: Positive for abdominal pain, constipation and nausea. Negative for diarrhea and vomiting.  Genitourinary: Negative for dysuria and flank pain.  Musculoskeletal: Negative for neck pain and neck stiffness.  Skin: Negative for rash and wound.  Allergic/Immunologic: Negative for immunocompromised state.  Neurological: Positive for weakness. Negative for syncope and headaches.  All other systems reviewed and are negative.    Physical Exam Updated Vital Signs BP (!) 168/75 (BP Location: Right Arm)   Pulse 77   Temp 98.7 F (37.1 C) (Oral)   Resp 18   SpO2 95%   Physical Exam  Constitutional: She is oriented to person, place, and time. She appears well-developed and well-nourished. She appears distressed.  HENT:  Head: Normocephalic and atraumatic.  Eyes: Conjunctivae are normal.  Neck: Neck supple.  Cardiovascular: Normal rate, regular rhythm and normal heart sounds. Exam reveals no friction rub.  No murmur heard. Pulmonary/Chest: Accessory muscle usage present. Tachypnea noted. No respiratory distress. She has wheezes in the right upper field, the right middle field, the right lower field, the left upper field, the left middle field and the left lower field. She has rhonchi in the right lower field and the left lower field. She has no rales.  Abdominal: Soft. Bowel sounds are normal. She exhibits distension.  There is generalized tenderness and tenderness in the right upper quadrant, epigastric area and left upper quadrant.  Musculoskeletal: She exhibits no edema.  Neurological: She is alert and oriented to person, place, and time. She exhibits normal muscle tone.  Skin: Skin is warm. Capillary refill takes less than 2 seconds.  Psychiatric: She has a normal mood and affect.  Nursing note and vitals reviewed.    ED Treatments / Results  Labs (all labs ordered are listed, but only  abnormal results are displayed) Labs Reviewed  COMPREHENSIVE METABOLIC PANEL - Abnormal; Notable for the following components:      Result Value   Sodium 131 (*)    Chloride 89 (*)    All other components within normal limits  URINALYSIS, ROUTINE W REFLEX MICROSCOPIC - Abnormal; Notable for the following components:   Color, Urine STRAW (*)    pH 9.0 (*)    All other components within normal limits  CULTURE, BLOOD (ROUTINE X 2)  CULTURE, BLOOD (ROUTINE X 2)  CBC WITH DIFFERENTIAL/PLATELET  LIPASE, BLOOD  BRAIN NATRIURETIC PEPTIDE  TROPONIN I  TROPONIN I  CBC  BASIC METABOLIC PANEL  MAGNESIUM  TROPONIN I  TROPONIN I  I-STAT CG4 LACTIC ACID, ED    EKG EKG Interpretation  Date/Time:  Sunday October 06 2017 21:10:00 EDT Ventricular Rate:  80 PR Interval:    QRS Duration: 90 QT Interval:  410 QTC Calculation: 473 R Axis:   61 Text Interpretation:  Sinus rhythm Low voltage, precordial leads No significant change since last tracing Confirmed by Duffy Bruce [phone]) on 10/06/2017 9:17:25 PM   Radiology Dg Chest 2 View  Result Date: 10/06/2017 CLINICAL DATA:  Short of breath EXAM: CHEST - 2 VIEW COMPARISON:  07/30/2017 FINDINGS: Cardiomegaly with mild central congestion. Subsegmental atelectasis at the left base. No pleural effusion. Aortic atherosclerosis. No pneumothorax. IMPRESSION: Cardiomegaly with mild vascular congestion. Electronically Signed   By: Donavan Foil M.D.   On: 10/06/2017 23:16    Ct Abdomen Pelvis W Contrast  Result Date: 10/06/2017 CLINICAL DATA:  Shortness of breath, productive cough, epigastric abdominal pain, nausea, urinary frequency EXAM: CT ABDOMEN AND PELVIS WITH CONTRAST TECHNIQUE: Multidetector CT imaging of the abdomen and pelvis was performed using the standard protocol following bolus administration of intravenous contrast. CONTRAST:  158mL ISOVUE-300 IOPAMIDOL (ISOVUE-300) INJECTION 61% COMPARISON:  08/03/2017 FINDINGS: Lower chest: Mild linear scarring/atelectasis in the bilateral lower lobes. The heart is top-normal in size.  Small pericardial effusion. Hepatobiliary: Liver is within normal limits. Status post cholecystectomy. No intrahepatic or extrahepatic ductal dilatation. Pancreas: Within normal limits. Spleen: Within normal limits. Adrenals/Urinary Tract: Adrenal glands are within normal limits. Scarring with calyceal diverticulum in the posterior right upper kidney (series 2/image 31). 11 mm calyceal diverticulum in the anterior interpolar right kidney (series 2/image 36). Left kidney is within normal limits. No hydronephrosis. Bladder is within normal limits. Stomach/Bowel: Stomach is within normal limits. No evidence of bowel obstruction. No colonic wall thickening or inflammatory changes. Vascular/Lymphatic: No evidence of abdominal aortic aneurysm. Atherosclerotic calcifications of the abdominal aorta and branch vessels. No suspicious abdominopelvic lymphadenopathy. Reproductive: Uterus is within normal limits. No adnexal masses. Other: No abdominopelvic ascites. Musculoskeletal: Moderate compression fracture deformity at L2. Multiple mild superior endplate compression fracture deformities at T10-12. These are unchanged from the prior. IMPRESSION: No evidence of bowel obstruction. No CT findings to account for the patient's abdominal pain. Additional stable ancillary findings as above. Electronically Signed   By: Julian Hy M.D.   On: 10/06/2017 23:16     Procedures .Critical Care Performed by: Duffy Bruce, MD Authorized by: Duffy Bruce, MD   Critical care provider statement:    Critical care time (minutes):  35   Critical care time was exclusive of:  Separately billable procedures and treating other patients and teaching time   Critical care was necessary to treat or prevent imminent or life-threatening deterioration of the following conditions:  Respiratory failure, circulatory failure and cardiac failure   Critical care  was time spent personally by me on the following activities:  Development of treatment plan with patient or surrogate, discussions with consultants, evaluation of patient's response to treatment, examination of patient, obtaining history from patient or surrogate, ordering and performing treatments and interventions, ordering and review of laboratory studies, ordering and review of radiographic studies, pulse oximetry, re-evaluation of patient's condition and review of old charts   I assumed direction of critical care for this patient from another provider in my specialty: no     (including critical care time)  Medications Ordered in ED Medications  iopamidol (ISOVUE-300) 61 % injection (has no administration in time range)  sodium chloride 0.9 % injection (has no administration in time range)  doxycycline (VIBRAMYCIN) 100 mg in sodium chloride 0.9 % 250 mL IVPB (100 mg Intravenous Natalie Bag/Given 10/07/17 0031)  albuterol (PROVENTIL) (2.5 MG/3ML) 0.083% nebulizer solution 5 mg (5 mg Nebulization Not Given 10/07/17 0119)  ALPRAZolam (XANAX) tablet 0.5 mg (has no administration in time range)  aspirin EC tablet 81 mg (has no administration in time range)  divalproex (DEPAKOTE) DR tablet 250 mg (has no administration in time range)  carvedilol (COREG) tablet 25 mg (has no administration in time range)  sodium chloride tablet 1 g (has no administration in time range)  cloNIDine (CATAPRES - Dosed in mg/24 hr) patch  0.3 mg (has no administration in time range)  budesonide (PULMICORT) nebulizer solution 0.5 mg (0.5 mg Nebulization Not Given 10/07/17 0121)  fluticasone (FLONASE) 50 MCG/ACT nasal spray 2 spray (has no administration in time range)  ferrous sulfate tablet 325 mg (has no administration in time range)  arformoterol (BROVANA) nebulizer solution 15 mcg (15 mcg Nebulization Not Given 10/07/17 0120)  hydrALAZINE (APRESOLINE) tablet 100 mg (has no administration in time range)  guaiFENesin (ROBITUSSIN) 100 MG/5ML solution 200 mg (has no administration in time range)  lurasidone (LATUDA) tablet 80 mg (has no administration in time range)  Melatonin TABS 10 mg (has no administration in time range)  magnesium oxide (MAG-OX) tablet 800 mg (has no administration in time range)  neomycin-bacitracin-polymyxin (NEOSPORIN) ointment 1 application (has no administration in time range)  NIFEdipine (PROCARDIA-XL/ADALAT-CC/NIFEDICAL-XL) 24 hr tablet 90 mg (has no administration in time range)  pantoprazole (PROTONIX) EC tablet 40 mg (has no administration in time range)  ipratropium-albuterol (DUONEB) 0.5-2.5 (3) MG/3ML nebulizer solution 3 mL (has no administration in time range)  methylPREDNISolone sodium succinate (SOLU-MEDROL) 40 mg/mL injection 40 mg (has no administration in time range)  enoxaparin (LOVENOX) injection 40 mg (has no administration in time range)  ondansetron (ZOFRAN) tablet 4 mg (has no administration in time range)    Or  ondansetron (ZOFRAN) injection 4 mg (has no administration in time range)  acetaminophen (TYLENOL) tablet 650 mg (has no administration in time range)    Or  acetaminophen (TYLENOL) suppository 650 mg (has no administration in time range)  doxycycline (VIBRA-TABS) tablet 100 mg (has no administration in time range)  furosemide (LASIX) tablet 40 mg (has no administration in time range)  ipratropium-albuterol (DUONEB) 0.5-2.5 (3) MG/3ML nebulizer solution 3 mL (has no  administration in time range)  oxyCODONE (Oxy IR/ROXICODONE) immediate release tablet 5 mg (has no administration in time range)  PARoxetine (PAXIL) tablet 20 mg (has no administration in time range)  pravastatin (PRAVACHOL) tablet 20 mg (has no administration in time range)  sucralfate (CARAFATE) tablet 1 g (has no administration in time range)  morphine 2 MG/ML injection 2 mg (has no administration in time range)  insulin aspart (novoLOG) injection 0-9 Units (has no administration in time range)  fentaNYL (SUBLIMAZE) injection 50 mcg (50 mcg Intravenous Given 10/06/17 2121)  methylPREDNISolone sodium succinate (SOLU-MEDROL) 125 mg/2 mL injection 125 mg (125 mg Intravenous Given 10/06/17 2121)  0.9 %  sodium chloride infusion ( Intravenous Natalie Bag/Given 10/06/17 2121)  albuterol (PROVENTIL,VENTOLIN) solution continuous neb (10 mg/hr Nebulization Given 10/06/17 2102)  ipratropium (ATROVENT) nebulizer solution 1 mg (1 mg Nebulization Given 10/06/17 2102)  iopamidol (ISOVUE-300) 61 % injection 100 mL (100 mLs Intravenous Contrast Given 10/06/17 2243)  furosemide (LASIX) injection 20 mg (20 mg Intravenous Given 10/07/17 0213)     Initial Impression / Assessment and Plan / ED Course  I have reviewed the triage vital signs and the nursing notes.  Pertinent labs & imaging results that were available during my care of the patient were reviewed by me and considered in my medical decision making (see chart for details).     57 year old female with past medical history as above here with cough, shortness of breath, nausea, and sputum production.  Clinically, concern for COPD exacerbation with possible pneumonia. She has significant increased WOB on arrival. Will give IV steroids, place on continuous neb, and obtain CXR as well as CT A/P given c/o abdominal pain as well.  Labs overall reassuring. Renal function at baseline. EKG non-ischemic and trop neg. BNP wnl. CBC unremarkable. CT A/P neg and CXR shows no  PNA. Pt improved after 1 hour conitnuous albuterol. Admit for COPD exacerbation.  Final Clinical Impressions(s) / ED Diagnoses   Final diagnoses:  COPD exacerbation [facility])    ED Discharge Orders    None       Duffy Bruce, MD 10/07/17 [phone]

## 2017-10-06 NOTE — ED Notes (Signed)
ED TO INPATIENT HANDOFF REPORT  Name/Age/Gender Natalie Thomas 57 y.o. female  Code Status Code Status History    Date Active Date Inactive Code Status Order ID Comments User Context   07/31/2017 0309 08/12/2017 1840 Full Code [id number]  Damita Lack, MD ED   08/18/2011 2311 08/21/2011 1248 Full Code [id number]  Mackie Pai., MD ED   08/18/2011 1803 08/18/2011 1819 Full Code [id number]  Linus Mako, [place] ED   07/21/2011 0310 07/24/2011 2323 Full Code [id number]  Garald Balding, NP ED   07/02/2011 1258 07/04/2011 1914 Full Code [id number]  Sheliah Mends, PA-C ED   06/16/2011 0606 06/17/2011 2024 Full Code [id number]  Linus Mako, PA ED      Home/SNF/Other Nursing Home  Chief Complaint respiratory distress; abd pain  Level of Care/Admitting Diagnosis ED Disposition    ED Disposition Condition Comment   Admit  The patient appears reasonably stabilized for admission considering the current resources, flow, and capabilities available in the ED at this time, and I doubt any other [facility] requiring further screening and/or treatment in the ED prior to admission is  present.       Medical History Past Medical History:  Diagnosis Date  . CHF (congestive heart failure) ([place])   . COPD (chronic obstructive pulmonary disease) ([place])   . Depression   . Diabetes mellitus, type 2 ([place])    pt denies but states that she has been treated for DM  . GERD (gastroesophageal reflux disease)   . Hypertension   . Osteoporosis     Allergies Allergies  Allergen Reactions  . Clarithromycin Itching  . Penicillins Itching    IV Location/Drains/Wounds Patient Lines/Drains/Airways Status   Active Line/Drains/Airways    Name:   Placement date:   Placement time:   Site:   Days:   Peripheral IV 10/06/17 Right Hand   10/06/17    2120    Hand   less than 1   Peripheral IV 10/06/17 Left Forearm   10/06/17    2132    Forearm   less than 1   External Urinary Catheter   -    -    -              Labs/Imaging Results for orders placed or performed during the hospital encounter of 10/06/17 (from the past 48 hour(s))  CBC with Differential     Status: None   Collection Time: 10/06/17  8:46 PM  Result Value Ref Range   WBC 10.0 4.0 - 10.5 K/uL   RBC 4.49 3.87 - 5.11 MIL/uL   Hemoglobin 13.3 12.0 - 15.0 g/dL   HCT 40.5 36.0 - 46.0 %   MCV 90.2 78.0 - 100.0 fL   MCH 29.6 26.0 - 34.0 pg   MCHC 32.8 30.0 - 36.0 g/dL   RDW 15.0 11.5 - 15.5 %   Platelets 222 150 - 400 K/uL   Neutrophils Relative % 68 %   Neutro Abs 6.8 1.7 - 7.7 K/uL   Lymphocytes Relative 25 %   Lymphs Abs 2.5 0.7 - 4.0 K/uL   Monocytes Relative 7 %   Monocytes Absolute 0.7 0.1 - 1.0 K/uL   Eosinophils Relative 0 %   Eosinophils Absolute 0.0 0.0 - 0.7 K/uL   Basophils Relative 0 %   Basophils Absolute 0.0 0.0 - 0.1 K/uL    Comment: Performed at [facility], [place] [address], [place], [place] [address]  Comprehensive metabolic panel  Status: Abnormal   Collection Time: 10/06/17  8:46 PM  Result Value Ref Range   Sodium 131 (L) 135 - 145 mmol/L   Potassium 3.7 3.5 - 5.1 mmol/L   Chloride 89 (L) 101 - 111 mmol/L   CO2 30 22 - 32 mmol/L   Glucose, Bld 93 65 - 99 mg/dL   BUN 8 6 - 20 mg/dL   Creatinine, Ser 0.47 0.44 - 1.00 mg/dL   Calcium 9.5 8.9 - 10.3 mg/dL   Total Protein 7.8 6.5 - 8.1 g/dL   Albumin 4.0 3.5 - 5.0 g/dL   AST 16 15 - 41 U/L   ALT 19 14 - 54 U/L   Alkaline Phosphatase 79 38 - 126 U/L   Total Bilirubin 0.6 0.3 - 1.2 mg/dL   GFR calc non Af Amer >60 >60 mL/min   GFR calc Af Amer >60 >60 mL/min    Comment: (NOTE) The eGFR has been calculated using the CKD EPI equation. This calculation has not been validated in all clinical situations. eGFR's persistently <60 mL/min signify possible Chronic Kidney Disease.    Anion gap 12 5 - 15    Comment: Performed at [facility], [place] [address], [place], [place] [address]  Lipase, blood     Status: None    Collection Time: 10/06/17  8:46 PM  Result Value Ref Range   Lipase 34 11 - 51 U/L    Comment: Performed at [facility], [place] [address], [place], [place] [address]  Troponin I     Status: None   Collection Time: 10/06/17  8:46 PM  Result Value Ref Range   Troponin I <0.03 <0.03 ng/mL    Comment: Performed at [facility], [place] [address], [place], [place] [address]  Brain natriuretic peptide     Status: None   Collection Time: 10/06/17  8:47 PM  Result Value Ref Range   B Natriuretic Peptide 87.6 0.0 - 100.0 pg/mL    Comment: Performed at [facility], [place] [address], [place],  [address]  I-Stat CG4 Lactic Acid, ED     Status: None   Collection Time: 10/06/17  9:07 PM  Result Value Ref Range   Lactic Acid, Venous 0.61 0.5 - 1.9 mmol/L   Dg Chest 2 View  Result Date: 10/06/2017 CLINICAL DATA:  Short of breath EXAM: CHEST - 2 VIEW COMPARISON:  07/30/2017 FINDINGS: Cardiomegaly with mild central congestion. Subsegmental atelectasis at the left base. No pleural effusion. Aortic atherosclerosis. No pneumothorax. IMPRESSION: Cardiomegaly with mild vascular congestion. Electronically Signed   By: Donavan Foil M.D.   On: 10/06/2017 23:16   Ct Abdomen Pelvis W Contrast  Result Date: 10/06/2017 CLINICAL DATA:  Shortness of breath, productive cough, epigastric abdominal pain, nausea, urinary frequency EXAM: CT ABDOMEN AND PELVIS WITH CONTRAST TECHNIQUE: Multidetector CT imaging of the abdomen and pelvis was performed using the standard protocol following bolus administration of intravenous contrast. CONTRAST:  135m ISOVUE-300 IOPAMIDOL (ISOVUE-300) INJECTION 61% COMPARISON:  08/03/2017 FINDINGS: Lower chest: Mild linear scarring/atelectasis in the bilateral lower lobes. The heart is top-normal in size.  Small pericardial effusion. Hepatobiliary: Liver is within normal limits. Status post cholecystectomy. No intrahepatic or  extrahepatic ductal dilatation. Pancreas: Within normal limits. Spleen: Within normal limits. Adrenals/Urinary Tract: Adrenal glands are within normal limits. Scarring with calyceal diverticulum in the posterior right upper kidney (series 2/image 31). 11 mm calyceal diverticulum in the anterior interpolar right kidney (series 2/image 36). Left kidney  is within normal limits. No hydronephrosis. Bladder is within normal limits. Stomach/Bowel: Stomach is within normal limits. No evidence of bowel obstruction. No colonic wall thickening or inflammatory changes. Vascular/Lymphatic: No evidence of abdominal aortic aneurysm. Atherosclerotic calcifications of the abdominal aorta and branch vessels. No suspicious abdominopelvic lymphadenopathy. Reproductive: Uterus is within normal limits. No adnexal masses. Other: No abdominopelvic ascites. Musculoskeletal: Moderate compression fracture deformity at L2. Multiple mild superior endplate compression fracture deformities at T10-12. These are unchanged from the prior. IMPRESSION: No evidence of bowel obstruction. No CT findings to account for the patient's abdominal pain. Additional stable ancillary findings as above. Electronically Signed   By: Julian Hy M.D.   On: 10/06/2017 23:16    Pending Labs Unresulted Labs (From admission, onward)   Start     Ordered   10/06/17 2048  Urinalysis, Routine w reflex microscopic  Once,   R     10/06/17 2047   10/06/17 2047  Blood culture (routine x 2)  BLOOD CULTURE X 2,   STAT     10/06/17 2046      Vitals/Pain Today's Vitals   10/06/17 2130 10/06/17 2200 10/06/17 2230 10/06/17 2237  BP: (!) 160/78 (!) 185/78 (!) 193/84 (!) 193/84  Pulse: 85 81  82  Resp: _0 Temp:      SpO2: 91% 99%  93%  PainSc:        Isolation Precautions No active isolations  Medications Medications  iopamidol (ISOVUE-300) 61 % injection (has no administration in time range)  sodium chloride 0.9 % injection (has no  administration in time range)  doxycycline (VIBRAMYCIN) 100 mg in sodium chloride 0.9 % 250 mL IVPB (has no administration in time range)  fentaNYL (SUBLIMAZE) injection 50 mcg (50 mcg Intravenous Given 10/06/17 2121)  methylPREDNISolone sodium succinate (SOLU-MEDROL) 125 mg/2 mL injection 125 mg (125 mg Intravenous Given 10/06/17 2121)  0.9 %  sodium chloride infusion ( Intravenous New Bag/Given 10/06/17 2121)  albuterol (PROVENTIL,VENTOLIN) solution continuous neb (10 mg/hr Nebulization Given 10/06/17 2102)  ipratropium (ATROVENT) nebulizer solution 1 mg (1 mg Nebulization Given 10/06/17 2102)  iopamidol (ISOVUE-300) 61 % injection 100 mL (100 mLs Intravenous Contrast Given 10/06/17 2243)    Mobility non-ambulatory

## 2017-10-07 DIAGNOSIS — J9622 Acute and chronic respiratory failure with hypercapnia: Secondary | ICD-10-CM

## 2017-10-07 DIAGNOSIS — M810 Age-related osteoporosis without current pathological fracture: Secondary | ICD-10-CM | POA: Diagnosis not present

## 2017-10-07 DIAGNOSIS — I10 Essential (primary) hypertension: Secondary | ICD-10-CM | POA: Diagnosis not present

## 2017-10-07 DIAGNOSIS — E785 Hyperlipidemia, unspecified: Secondary | ICD-10-CM

## 2017-10-07 DIAGNOSIS — F250 Schizoaffective disorder, bipolar type: Secondary | ICD-10-CM

## 2017-10-07 DIAGNOSIS — E871 Hypo-osmolality and hyponatremia: Secondary | ICD-10-CM | POA: Diagnosis not present

## 2017-10-07 DIAGNOSIS — J9621 Acute and chronic respiratory failure with hypoxia: Secondary | ICD-10-CM | POA: Diagnosis not present

## 2017-10-07 DIAGNOSIS — Z9981 Dependence on supplemental oxygen: Secondary | ICD-10-CM | POA: Diagnosis not present

## 2017-10-07 DIAGNOSIS — Z7951 Long term (current) use of inhaled steroids: Secondary | ICD-10-CM | POA: Diagnosis not present

## 2017-10-07 DIAGNOSIS — J441 Chronic obstructive pulmonary disease with (acute) exacerbation: Principal | ICD-10-CM

## 2017-10-07 DIAGNOSIS — E119 Type 2 diabetes mellitus without complications: Secondary | ICD-10-CM | POA: Diagnosis not present

## 2017-10-07 DIAGNOSIS — J9611 Chronic respiratory failure with hypoxia: Secondary | ICD-10-CM

## 2017-10-07 DIAGNOSIS — K219 Gastro-esophageal reflux disease without esophagitis: Secondary | ICD-10-CM | POA: Diagnosis not present

## 2017-10-07 DIAGNOSIS — F17210 Nicotine dependence, cigarettes, uncomplicated: Secondary | ICD-10-CM | POA: Diagnosis not present

## 2017-10-07 DIAGNOSIS — Z79899 Other long term (current) drug therapy: Secondary | ICD-10-CM | POA: Diagnosis not present

## 2017-10-07 DIAGNOSIS — F419 Anxiety disorder, unspecified: Secondary | ICD-10-CM | POA: Diagnosis not present

## 2017-10-07 DIAGNOSIS — Z794 Long term (current) use of insulin: Secondary | ICD-10-CM | POA: Diagnosis not present

## 2017-10-07 DIAGNOSIS — Z7982 Long term (current) use of aspirin: Secondary | ICD-10-CM | POA: Diagnosis not present

## 2017-10-07 HISTORY — DX: Hyperlipidemia, unspecified: E78.5

## 2017-10-07 LAB — BASIC METABOLIC PANEL
ANION GAP: 10 (ref 5–15)
BUN: 9 mg/dL (ref 6–20)
CHLORIDE: 93 mmol/L — AB (ref 101–111)
CO2: 28 mmol/L (ref 22–32)
Calcium: 9.2 mg/dL (ref 8.9–10.3)
Creatinine, Ser: 0.53 mg/dL (ref 0.44–1.00)
GFR calc non Af Amer: >60 mL/min (ref >60)
Glucose, Bld: 166 mg/dL — ABNORMAL HIGH (ref 65–99)
POTASSIUM: 4.2 mmol/L (ref 3.5–5.1)
Sodium: 131 mmol/L — ABNORMAL LOW (ref 135–145)

## 2017-10-07 LAB — CBC
HEMATOCRIT: 41.1 % (ref 36.0–46.0)
HEMOGLOBIN: 13 g/dL (ref 12.0–15.0)
MCH: 28.8 pg (ref 26.0–34.0)
MCHC: 31.6 g/dL (ref 30.0–36.0)
MCV: 91.1 fL (ref 78.0–100.0)
Platelets: 228 10*3/uL (ref 150–400)
RBC: 4.51 MIL/uL (ref 3.87–5.11)
RDW: 15.2 % (ref 11.5–15.5)
WBC: 6.8 10*3/uL (ref 4.0–10.5)

## 2017-10-07 LAB — MRSA PCR SCREENING: MRSA BY PCR: NEGATIVE

## 2017-10-07 LAB — URINALYSIS, ROUTINE W REFLEX MICROSCOPIC
Bilirubin Urine: NEGATIVE
Glucose, UA: NEGATIVE mg/dL
HGB URINE DIPSTICK: NEGATIVE
KETONES UR: NEGATIVE mg/dL
LEUKOCYTES UA: NEGATIVE
Nitrite: NEGATIVE
PROTEIN: NEGATIVE mg/dL
Specific Gravity, Urine: 1.008 (ref 1.005–1.030)
pH: 9 — ABNORMAL HIGH (ref 5.0–8.0)

## 2017-10-07 LAB — GLUCOSE, CAPILLARY
GLUCOSE-CAPILLARY: 173 mg/dL — AB (ref 65–99)
GLUCOSE-CAPILLARY: 164 mg/dL — AB (ref 65–99)
Glucose-Capillary: 177 mg/dL — ABNORMAL HIGH (ref 65–99)
Glucose-Capillary: 165 mg/dL — ABNORMAL HIGH (ref 65–99)

## 2017-10-07 LAB — TROPONIN I: Troponin I: <0.03 ng/mL (ref <0.03)

## 2017-10-07 LAB — MAGNESIUM: Magnesium: 1.7 mg/dL (ref 1.7–2.4)

## 2017-10-07 MED ORDER — BUDESONIDE 0.5 MG/2ML IN SUSP
0.5000 mg | Freq: Two times a day (BID) | RESPIRATORY_TRACT | Status: DC
  Administered 2017-10-07 – 2017-10-08 (×3): 0.5 mg via RESPIRATORY_TRACT
  Filled 2017-10-07 (×3): qty 2

## 2017-10-07 MED ORDER — NIFEDIPINE ER OSMOTIC RELEASE 30 MG PO TB24
90.0000 mg | ORAL_TABLET | Freq: Every day | ORAL | Status: DC
  Administered 2017-10-07 – 2017-10-08 (×2): 90 mg via ORAL
  Filled 2017-10-07 (×2): qty 1

## 2017-10-07 MED ORDER — CARVEDILOL 25 MG PO TABS
25.0000 mg | ORAL_TABLET | Freq: Two times a day (BID) | ORAL | Status: DC
  Administered 2017-10-07 – 2017-10-08 (×3): 25 mg via ORAL
  Filled 2017-10-07 (×3): qty 1

## 2017-10-07 MED ORDER — IPRATROPIUM-ALBUTEROL 0.5-2.5 (3) MG/3ML IN SOLN
3.0000 mL | RESPIRATORY_TRACT | Status: DC | PRN
  Administered 2017-10-08: 3 mL via RESPIRATORY_TRACT
  Filled 2017-10-07: qty 3

## 2017-10-07 MED ORDER — ARFORMOTEROL TARTRATE 15 MCG/2ML IN NEBU
15.0000 ug | INHALATION_SOLUTION | Freq: Two times a day (BID) | RESPIRATORY_TRACT | Status: DC
  Administered 2017-10-07 – 2017-10-08 (×3): 15 ug via RESPIRATORY_TRACT
  Filled 2017-10-07 (×5): qty 2

## 2017-10-07 MED ORDER — FUROSEMIDE 10 MG/ML IJ SOLN
20.0000 mg | Freq: Once | INTRAMUSCULAR | Status: AC
  Administered 2017-10-07: 20 mg via INTRAVENOUS
  Filled 2017-10-07: qty 2

## 2017-10-07 MED ORDER — HYDRALAZINE HCL 20 MG/ML IJ SOLN
10.0000 mg | INTRAMUSCULAR | Status: DC | PRN
  Administered 2017-10-07: 10 mg via INTRAVENOUS
  Filled 2017-10-07: qty 1

## 2017-10-07 MED ORDER — IPRATROPIUM-ALBUTEROL 0.5-2.5 (3) MG/3ML IN SOLN
3.0000 mL | Freq: Three times a day (TID) | RESPIRATORY_TRACT | Status: DC
  Administered 2017-10-07 – 2017-10-08 (×4): 3 mL via RESPIRATORY_TRACT
  Filled 2017-10-07 (×4): qty 3

## 2017-10-07 MED ORDER — IPRATROPIUM-ALBUTEROL 0.5-2.5 (3) MG/3ML IN SOLN: 3.0000 mL | Freq: Three times a day (TID) | RESPIRATORY_TRACT | Status: DC | PRN

## 2017-10-07 MED ORDER — ONDANSETRON HCL 4 MG/2ML IJ SOLN: 4.0000 mg | Freq: Four times a day (QID) | INTRAMUSCULAR | Status: DC | PRN

## 2017-10-07 MED ORDER — PRAVASTATIN SODIUM 20 MG PO TABS
20.0000 mg | ORAL_TABLET | Freq: Every day | ORAL | Status: DC
  Administered 2017-10-07 – 2017-10-08 (×2): 20 mg via ORAL
  Filled 2017-10-07 (×2): qty 1

## 2017-10-07 MED ORDER — ENOXAPARIN SODIUM 40 MG/0.4ML ~~LOC~~ SOLN
40.0000 mg | SUBCUTANEOUS | Status: DC
  Administered 2017-10-07 – 2017-10-08 (×2): 40 mg via SUBCUTANEOUS
  Filled 2017-10-07 (×2): qty 0.4

## 2017-10-07 MED ORDER — ASPIRIN EC 81 MG PO TBEC
81.0000 mg | DELAYED_RELEASE_TABLET | Freq: Every day | ORAL | Status: DC
  Administered 2017-10-07 – 2017-10-08 (×2): 81 mg via ORAL
  Filled 2017-10-07 (×2): qty 1

## 2017-10-07 MED ORDER — ALBUTEROL SULFATE (2.5 MG/3ML) 0.083% IN NEBU: 5.0000 mg | INHALATION_SOLUTION | Freq: Once | RESPIRATORY_TRACT | Status: DC

## 2017-10-07 MED ORDER — BACITRACIN-NEOMYCIN-POLYMYXIN 400-5-5000 EX OINT: 1.0000 "application " | TOPICAL_OINTMENT | Freq: Every day | CUTANEOUS | Status: DC | PRN

## 2017-10-07 MED ORDER — DIVALPROEX SODIUM 250 MG PO DR TAB
250.0000 mg | DELAYED_RELEASE_TABLET | Freq: Two times a day (BID) | ORAL | Status: DC
  Administered 2017-10-07 – 2017-10-08 (×3): 250 mg via ORAL
  Filled 2017-10-07 (×4): qty 1

## 2017-10-07 MED ORDER — METHYLPREDNISOLONE SODIUM SUCC 40 MG IJ SOLR
40.0000 mg | Freq: Three times a day (TID) | INTRAMUSCULAR | Status: DC
  Administered 2017-10-07 – 2017-10-08 (×5): 40 mg via INTRAVENOUS
  Filled 2017-10-07 (×5): qty 1

## 2017-10-07 MED ORDER — MELATONIN 5 MG PO TABS
10.0000 mg | ORAL_TABLET | Freq: Every day | ORAL | Status: DC
Start: 2017-10-07 — End: 2017-10-08
  Administered 2017-10-07: 10 mg via ORAL
  Filled 2017-10-07 (×3): qty 2

## 2017-10-07 MED ORDER — MORPHINE SULFATE (PF) 2 MG/ML IV SOLN: 2.0000 mg | Freq: Once | INTRAVENOUS | Status: DC

## 2017-10-07 MED ORDER — CLONIDINE HCL 0.3 MG/24HR TD PTWK
0.3000 mg | MEDICATED_PATCH | TRANSDERMAL | Status: DC
  Administered 2017-10-07: 0.3 mg via TRANSDERMAL
  Filled 2017-10-07: qty 1

## 2017-10-07 MED ORDER — FLUTICASONE PROPIONATE 50 MCG/ACT NA SUSP
2.0000 | Freq: Two times a day (BID) | NASAL | Status: DC
  Administered 2017-10-07 – 2017-10-08 (×3): 2 via NASAL
  Filled 2017-10-07: qty 16

## 2017-10-07 MED ORDER — SUCRALFATE 1 G PO TABS
1.0000 g | ORAL_TABLET | Freq: Four times a day (QID) | ORAL | Status: DC
  Administered 2017-10-07 – 2017-10-08 (×6): 1 g via ORAL
  Filled 2017-10-07 (×6): qty 1

## 2017-10-07 MED ORDER — SODIUM CHLORIDE 1 G PO TABS
1.0000 g | ORAL_TABLET | Freq: Three times a day (TID) | ORAL | Status: DC
  Administered 2017-10-07 – 2017-10-08 (×5): 1 g via ORAL
  Filled 2017-10-07 (×5): qty 1

## 2017-10-07 MED ORDER — FERROUS SULFATE 325 (65 FE) MG PO TABS
325.0000 mg | ORAL_TABLET | Freq: Every day | ORAL | Status: DC
  Administered 2017-10-07 – 2017-10-08 (×2): 325 mg via ORAL
  Filled 2017-10-07 (×2): qty 1

## 2017-10-07 MED ORDER — GUAIFENESIN 100 MG/5ML PO SOLN
200.0000 mg | Freq: Three times a day (TID) | ORAL | Status: DC | PRN
  Administered 2017-10-07: 200 mg via ORAL
  Filled 2017-10-07: qty 10

## 2017-10-07 MED ORDER — MORPHINE SULFATE (PF) 4 MG/ML IV SOLN
2.0000 mg | Freq: Once | INTRAVENOUS | Status: AC
  Administered 2017-10-07: 2 mg via INTRAVENOUS
  Filled 2017-10-07: qty 1

## 2017-10-07 MED ORDER — PAROXETINE HCL 20 MG PO TABS
20.0000 mg | ORAL_TABLET | Freq: Every day | ORAL | Status: DC
  Administered 2017-10-07 – 2017-10-08 (×2): 20 mg via ORAL
  Filled 2017-10-07 (×2): qty 1

## 2017-10-07 MED ORDER — IPRATROPIUM-ALBUTEROL 0.5-2.5 (3) MG/3ML IN SOLN
3.0000 mL | Freq: Four times a day (QID) | RESPIRATORY_TRACT | Status: DC
  Administered 2017-10-07: 3 mL via RESPIRATORY_TRACT
  Filled 2017-10-07: qty 3

## 2017-10-07 MED ORDER — PANTOPRAZOLE SODIUM 40 MG PO TBEC
40.0000 mg | DELAYED_RELEASE_TABLET | Freq: Two times a day (BID) | ORAL | Status: DC
  Administered 2017-10-07 – 2017-10-08 (×3): 40 mg via ORAL
  Filled 2017-10-07 (×4): qty 1

## 2017-10-07 MED ORDER — HYDRALAZINE HCL 50 MG PO TABS
100.0000 mg | ORAL_TABLET | Freq: Three times a day (TID) | ORAL | Status: DC
  Administered 2017-10-07 – 2017-10-08 (×5): 100 mg via ORAL
  Filled 2017-10-07 (×8): qty 2

## 2017-10-07 MED ORDER — ACETAMINOPHEN 650 MG RE SUPP: 650.0000 mg | Freq: Four times a day (QID) | RECTAL | Status: DC | PRN

## 2017-10-07 MED ORDER — ALPRAZOLAM 0.5 MG PO TABS
0.5000 mg | ORAL_TABLET | Freq: Three times a day (TID) | ORAL | Status: DC
  Administered 2017-10-07 – 2017-10-08 (×5): 0.5 mg via ORAL
  Filled 2017-10-07 (×5): qty 1

## 2017-10-07 MED ORDER — MAGNESIUM OXIDE 400 (241.3 MG) MG PO TABS
800.0000 mg | ORAL_TABLET | Freq: Two times a day (BID) | ORAL | Status: DC
  Administered 2017-10-07 – 2017-10-08 (×3): 800 mg via ORAL
  Filled 2017-10-07 (×4): qty 2

## 2017-10-07 MED ORDER — INSULIN ASPART 100 UNIT/ML ~~LOC~~ SOLN
0.0000 [IU] | Freq: Three times a day (TID) | SUBCUTANEOUS | Status: DC
  Administered 2017-10-07 – 2017-10-08 (×5): 2 [IU] via SUBCUTANEOUS

## 2017-10-07 MED ORDER — OXYCODONE HCL 5 MG PO TABS
5.0000 mg | ORAL_TABLET | Freq: Four times a day (QID) | ORAL | Status: DC | PRN
  Administered 2017-10-07 – 2017-10-08 (×4): 5 mg via ORAL
  Filled 2017-10-07 (×4): qty 1

## 2017-10-07 MED ORDER — FUROSEMIDE 40 MG PO TABS
40.0000 mg | ORAL_TABLET | Freq: Every day | ORAL | Status: DC
  Administered 2017-10-07 – 2017-10-08 (×2): 40 mg via ORAL
  Filled 2017-10-07 (×2): qty 1

## 2017-10-07 MED ORDER — ACETAMINOPHEN 325 MG PO TABS
650.0000 mg | ORAL_TABLET | Freq: Four times a day (QID) | ORAL | Status: DC | PRN
  Filled 2017-10-07: qty 2

## 2017-10-07 MED ORDER — LURASIDONE HCL 40 MG PO TABS
80.0000 mg | ORAL_TABLET | Freq: Every day | ORAL | Status: DC
  Administered 2017-10-07 – 2017-10-08 (×2): 80 mg via ORAL
  Filled 2017-10-07 (×2): qty 2

## 2017-10-07 MED ORDER — DOXYCYCLINE HYCLATE 100 MG PO TABS
100.0000 mg | ORAL_TABLET | Freq: Two times a day (BID) | ORAL | Status: DC
  Administered 2017-10-07 – 2017-10-08 (×3): 100 mg via ORAL
  Filled 2017-10-07 (×3): qty 1

## 2017-10-07 MED ORDER — ONDANSETRON HCL 4 MG PO TABS
4.0000 mg | ORAL_TABLET | Freq: Four times a day (QID) | ORAL | Status: DC | PRN
  Administered 2017-10-07: 4 mg via ORAL
  Filled 2017-10-07: qty 1

## 2017-10-07 NOTE — Progress Notes (Signed)
Patient admitted to room 1442 around 0145. After skin assessment , patient fall asleep, snoring,. Very difficulty to arouse to give po med. IV med given only.

## 2017-10-07 NOTE — H&P (Signed)
History and Physical    Natalie Thomas UUV:[id number] DOB: 01-Feb-1961 DOA: 10/06/2017  Referring MD/NP/PA: Dr. Duffy Bruce PCP: Renato Shin, MD  Patient coming from: [place] via EMS  Chief Complaint: Shortness of breath  I have personally briefly reviewed patient's old medical records in [place]   HPI: Natalie Thomas is a 57 y.o. female with medical history significant of HTN, COPD oxygen dependent on at least 2 L of nasal cannula oxygen, diet controlled DM type II, depression, anxiety, and GERD; who presents with worsening shortness of breath over the last couple of weeks.  She reports having a productive cough with yellow sputum production.  She complains of associated symptoms of generalized malaise, wheezing, throbbing epigastric abdominal pain, dysuria, intermittent lower extremity swelling, constipation, patient and dry heaves.  Denies having any significant fevers or chest pain.  Patient reports trying home treatments without relief of symptoms.   ED Course: Admission to the emergency department patient was noted to be afebrile, pulse 81-88, respirations 12-24, blood pressure 160/78-193/84, O2 saturation 91-99% on 3L of [place].  Labs revealed WBC 10, sodium 131, lactic acid 0.61, BNP 87.6, and all other labs relatively unremarkable.  Chest x-ray showing cardiomegaly with mild vascular congestion.  CT scan of the abdomen showed no acute abnormalities to explain patient's complaints.  Patient was given 125 mg of Solu-Medrol IV, continuous albuterol neb without relief of symptoms.  TRH called to admit for suspected COPD exacerbation.   Review of Systems  Constitutional: Positive for malaise/fatigue.  HENT: Negative for sore throat and tinnitus.   Eyes: Negative for photophobia and pain.  Respiratory: Positive for cough, shortness of breath and wheezing.   Cardiovascular: Positive for leg swelling. Negative for chest pain.  Gastrointestinal: Positive for abdominal  pain, constipation, nausea and vomiting.  Genitourinary: Positive for dysuria. Negative for flank pain.  Musculoskeletal: Negative for falls.  Neurological: Negative for focal weakness and loss of consciousness.  Endo/Heme/Allergies: Does not bruise/bleed easily.  Psychiatric/Behavioral: Negative for memory loss.    Past Medical History:  Diagnosis Date  . CHF (congestive heart failure) ([place])   . COPD (chronic obstructive pulmonary disease) ([place])   . Depression   . Diabetes mellitus, type 2 ([place])    pt denies but states that she has been treated for DM  . GERD (gastroesophageal reflux disease)   . Hypertension   . Osteoporosis     Past Surgical History:  Procedure Laterality Date  . EYE SURGERY     on left  eye, pt states that she sees bad out of the right eye and needs surgery there  . PELVIC FRACTURE SURGERY       reports that she has been smoking cigarettes.  She has been smoking about 0.25 packs per day. She has never used smokeless tobacco. She reports that she does not drink alcohol or use drugs.  Allergies  Allergen Reactions  . Clarithromycin Itching  . Penicillins Itching    Has patient had a PCN reaction causing immediate rash, facial/tongue/throat swelling, SOB or lightheadedness with hypotension: No Has patient had a PCN reaction causing severe rash involving mucus membranes or skin necrosis: No Has patient had a PCN reaction that required hospitalization: Unknown Has patient had a PCN reaction occurring within the last 10 years: No If all of the above answers are "NO", then may proceed with Cephalosporin use.     History reviewed. No pertinent family history.  Prior to Admission medications   Medication Sig Start  Date End Date Taking? Authorizing Provider  acetaminophen (TYLENOL) 325 MG tablet Take 650 mg by mouth every 6 (six) hours as needed for mild pain.   Yes [provider]  albuterol (PROVENTIL HFA;VENTOLIN HFA) 108 (90 Base) MCG/ACT inhaler  Inhale 2 puffs into the lungs every 4 (four) hours as needed for wheezing or shortness of breath. 07/05/17  Yes Ward, Delice Bison, DO  ALPRAZolam (XANAX) 0.5 MG tablet Take 1 tablet (0.5 mg total) by mouth 3 (three) times daily. 08/12/17  Yes Nita Sells, MD  alum & mag hydroxide-simeth ([place]) 200-200-20 MG/5ML suspension Take 30 mLs by mouth every 6 (six) hours as needed for indigestion or heartburn.   Yes [provider]  aspirin 81 MG tablet Take 1 tablet (81 mg total) by mouth daily. For heart health 08/06/11  Yes Greig Castilla, FNP  carvedilol (COREG) 25 MG tablet Take 25 mg by mouth 2 (two) times daily with a meal.   Yes [provider]  cloNIDine (CATAPRES - DOSED IN MG/24 HR) 0.3 mg/24hr patch Place 0.3 mg onto the skin once a week.   Yes [provider]  dextromethorphan-guaiFENesin (MUCINEX DM) 30-600 MG 12hr tablet Take 1 tablet by mouth every 12 (twelve) hours as needed for cough.   Yes [provider]  divalproex (DEPAKOTE) 250 MG DR tablet Take 1 tablet (250 mg total) by mouth every 12 (twelve) hours. 08/12/17  Yes Nita Sells, MD  ferrous sulfate 325 (65 FE) MG tablet Take 325 mg by mouth daily with breakfast.   Yes [provider]  fluticasone (FLONASE) 50 MCG/ACT nasal spray Place 2 sprays into both nostrils 2 (two) times daily.   Yes [provider]  fluticasone furoate-vilanterol (BREO ELLIPTA) 100-25 MCG/INH AEPB Inhale 1 puff into the lungs daily.   Yes [provider]  furosemide (LASIX) 40 MG tablet Take 1 tablet (40 mg total) by mouth daily. 08/13/17  Yes Nita Sells, MD  guaifenesin (ROBITUSSIN) 100 MG/5ML syrup Take 200 mg by mouth 3 (three) times daily as needed for cough.   Yes [provider]  hydrALAZINE (APRESOLINE) 100 MG tablet Take 100 mg by mouth every 8 (eight) hours.   Yes [provider]  ipratropium-albuterol (DUONEB) 0.5-2.5 (3) MG/3ML SOLN Take 3 mLs by  nebulization 3 (three) times daily as needed (SOb).   Yes [provider]  loperamide (IMODIUM) 2 MG capsule Take 2 mg by mouth as needed for diarrhea or loose stools.   Yes [provider]  lurasidone (LATUDA) 80 MG TABS tablet Take 1 tablet (80 mg total) by mouth daily with breakfast. 08/12/17  Yes Nita Sells, MD  magnesium hydroxide (MILK OF MAGNESIA) 400 MG/5ML suspension Take 30 mLs by mouth at bedtime as needed for mild constipation.   Yes [provider]  magnesium oxide (MAG-OX) 400 (241.3 Mg) MG tablet Take 2 tablets (800 mg total) by mouth 2 (two) times daily. 08/12/17  Yes Nita Sells, MD  Melatonin 10 MG TABS Take 10 mg by mouth at bedtime.   Yes [provider]  Multiple Vitamin (DAILY-VITE PO) Take 1 tablet by mouth daily.   Yes [provider]  neomycin-bacitracin-polymyxin (NEOSPORIN) OINT Apply 1 application topically daily as needed for wound care.   Yes [provider]  NIFEdipine (PROCARDIA XL/ADALAT-CC) 90 MG 24 hr tablet Take 90 mg by mouth daily.   Yes [provider]  omeprazole (PRILOSEC) 20 MG capsule Take 1 capsule (20 mg total) by mouth daily.  For acid reflux. Patient taking differently: Take 20 mg by mouth 2 (two) times daily before a meal. For acid reflux. 08/06/11  Yes Greig Castilla, FNP  oxyCODONE (OXY IR/ROXICODONE) 5 MG immediate release tablet Take 5 mg by mouth every 6 (six) hours as needed for severe pain.   Yes [provider]  PARoxetine (PAXIL) 20 MG tablet Take 2 tablets (40 mg total) by mouth daily. Patient taking differently: Take 20 mg by mouth daily.  08/12/17  Yes Nita Sells, MD  pravastatin (PRAVACHOL) 20 MG tablet Take 20 mg by mouth daily.   Yes [provider]  predniSONE (DELTASONE) 10 MG tablet Take 10 mg by mouth daily with breakfast.   Yes [provider]  promethazine (PHENERGAN) 25 MG tablet Take 25 mg by mouth every 6 (six)  hours as needed for nausea or vomiting.   Yes [provider]  sodium chloride 1 g tablet Take 1 g by mouth 3 (three) times daily with meals.    Yes [provider]  sucralfate (CARAFATE) 1 g tablet Take 1 g by mouth 4 (four) times daily.   Yes [provider]  buPROPion (WELLBUTRIN XL) 150 MG 24 hr tablet Take 1 tablet (150 mg total) by mouth daily. Patient not taking: Reported on 10/06/2017 08/13/17   Nita Sells, MD  oxyCODONE-acetaminophen (PERCOCET/ROXICET) 5-325 MG tablet Take 1 tablet by mouth every 4 (four) hours as needed for moderate pain. Patient not taking: Reported on 10/06/2017 08/12/17   Nita Sells, MD  polyethylene glycol [facility]) packet Take 17 g by mouth daily. Patient not taking: Reported on 10/06/2017 07/17/17   Horton, Barbette Hair, MD  senna (SENOKOT) 8.6 MG TABS tablet Take 1 tablet (8.6 mg total) by mouth daily. Patient not taking: Reported on 10/06/2017 08/12/17   Nita Sells, MD    Physical Exam:  Constitutional: Obese female who appears to be in moderate discomfort Vitals:   10/06/17 2130 10/06/17 2200 10/06/17 2230 10/06/17 2237  BP: (!) 160/78 (!) 185/78 (!) 193/84 (!) 193/84  Pulse: 85 81  82  Resp: 17 12 13 17   Temp:      SpO2: 91% 99%  93%   Eyes: PERRL, lids and conjunctivae normal ENMT: Mucous membranes are moist. Posterior pharynx clear of any exudate or lesions. Neck: normal, supple, no masses, no thyromegaly Respiratory: Normal respiratory effort with diffuse bilateral expiratory wheezes appreciated.  Patient able to talk complete sentences, and currently on 3 L of nasal cannula oxygen with O2 sats maintained. Cardiovascular: Regular rate and rhythm, no murmurs / rubs / gallops.  Trace lower extremity edema. 2+ pedal pulses. No carotid bruits.  Abdomen: Epigastric tenderness noted to palpation, no masses palpated. No hepatosplenomegaly. Bowel sounds positive.  Musculoskeletal: no clubbing / cyanosis. No  joint deformity upper and lower extremities. Good ROM, no contractures. Normal muscle tone.  Skin: no rashes, lesions, ulcers. No induration Neurologic: CN 2-12 grossly intact. Sensation intact, DTR normal. Strength 5/5 in all 4.  Psychiatric: Normal judgment and insight. Alert and oriented x 3.  Anxious mood.     Labs on Admission: I have personally reviewed following labs and imaging studies  CBC: Recent Labs  Lab 10/06/17 2046  WBC 10.0  NEUTROABS 6.8  HGB 13.3  HCT 40.5  MCV 90.2  PLT 161   Basic Metabolic Panel: Recent Labs  Lab 10/06/17 2046  NA 131*  K 3.7  CL 89*  CO2 30  GLUCOSE 93  BUN 8  CREATININE 0.47  CALCIUM 9.5   GFR: CrCl cannot be calculated (Unknown ideal weight.). Liver Function Tests: Recent Labs  Lab 10/06/17 2046  AST 16  ALT 19  ALKPHOS 79  BILITOT 0.6  PROT 7.8  ALBUMIN 4.0   Recent Labs  Lab 10/06/17 2046  LIPASE 34   No results for input(s): AMMONIA in the last 168 hours. Coagulation Profile: No results for input(s): INR, PROTIME in the last 168 hours. Cardiac Enzymes: Recent Labs  Lab 10/06/17 2046  TROPONINI <0.03   BNP (last 3 results) No results for input(s): PROBNP in the last 8760 hours. HbA1C: No results for input(s): HGBA1C in the last 72 hours. CBG: No results for input(s): GLUCAP in the last 168 hours. Lipid Profile: No results for input(s): CHOL, HDL, LDLCALC, TRIG, CHOLHDL, LDLDIRECT in the last 72 hours. Thyroid Function Tests: No results for input(s): TSH, T4TOTAL, FREET4, T3FREE, THYROIDAB in the last 72 hours. Anemia Panel: No results for input(s): VITAMINB12, FOLATE, FERRITIN, TIBC, IRON, RETICCTPCT in the last 72 hours. Urine analysis:    Component Value Date/Time   COLORURINE YELLOW 07/31/2017 0114   APPEARANCEUR CLEAR 07/31/2017 0114   APPEARANCEUR Clear 06/22/2012 0618   LABSPEC 1.008 07/31/2017 0114   LABSPEC 1.003 06/22/2012 0618   PHURINE 7.0 07/31/2017 0114   GLUCOSEU 50 (A) 07/31/2017  0114   GLUCOSEU Negative 06/22/2012 0618   GLUCOSEU NEGATIVE 04/09/2011 1058   HGBUR NEGATIVE 07/31/2017 0114   BILIRUBINUR NEGATIVE 07/31/2017 0114   BILIRUBINUR Negative 06/22/2012 0618   KETONESUR 5 (A) 07/31/2017 0114   PROTEINUR NEGATIVE 07/31/2017 0114   UROBILINOGEN 1.0 08/18/2011 1805   NITRITE NEGATIVE 07/31/2017 0114   LEUKOCYTESUR NEGATIVE 07/31/2017 0114   LEUKOCYTESUR Trace 06/22/2012 0618   Sepsis Labs: No results found for this or any previous visit (from the past 240 hour(s)).   Radiological Exams on Admission: Dg Chest 2 View  Result Date: 10/06/2017 CLINICAL DATA:  Short of breath EXAM: CHEST - 2 VIEW COMPARISON:  07/30/2017 FINDINGS: Cardiomegaly with mild central congestion. Subsegmental atelectasis at the left base. No pleural effusion. Aortic atherosclerosis. No pneumothorax. IMPRESSION: Cardiomegaly with mild vascular congestion. Electronically Signed   By: Donavan Foil M.D.   On: 10/06/2017 23:16   Ct Abdomen Pelvis W Contrast  Result Date: 10/06/2017 CLINICAL DATA:  Shortness of breath, productive cough, epigastric abdominal pain, nausea, urinary frequency EXAM: CT ABDOMEN AND PELVIS WITH CONTRAST TECHNIQUE: Multidetector CT imaging of the abdomen and pelvis was performed using the standard protocol following bolus administration of intravenous contrast. CONTRAST:  118mL ISOVUE-300 IOPAMIDOL (ISOVUE-300) INJECTION 61% COMPARISON:  08/03/2017 FINDINGS: Lower chest: Mild linear scarring/atelectasis in the bilateral lower lobes. The heart is top-normal in size.  Small pericardial effusion. Hepatobiliary: Liver is within normal limits. Status post cholecystectomy. No intrahepatic or extrahepatic ductal dilatation. Pancreas: Within normal limits. Spleen: Within normal limits. Adrenals/Urinary Tract: Adrenal glands are within normal limits. Scarring with calyceal diverticulum in the posterior right upper kidney (series 2/image 31). 11 mm calyceal diverticulum in the  anterior interpolar right kidney (series 2/image 36). Left kidney is within normal limits. No hydronephrosis. Bladder is within normal limits. Stomach/Bowel: Stomach is within normal limits. No evidence of bowel obstruction. No colonic wall thickening or inflammatory changes. Vascular/Lymphatic: No evidence of abdominal aortic aneurysm. Atherosclerotic calcifications of the abdominal aorta and branch vessels. No suspicious abdominopelvic lymphadenopathy. Reproductive: Uterus is within normal limits. No adnexal masses. Other: No abdominopelvic ascites. Musculoskeletal: Moderate compression fracture deformity at L2. Multiple mild superior endplate compression fracture deformities  at T10-12. These are unchanged from the prior. IMPRESSION: No evidence of bowel obstruction. No CT findings to account for the patient's abdominal pain. Additional stable ancillary findings as above. Electronically Signed   By: Julian Hy M.D.   On: 10/06/2017 23:16    EKG: Independently reviewed.  Sinus rhythm 80 bpm  Assessment/Plan COPD exacerbation,  respiratory failure: Acute on chronic.  Patient presents with complaints of cough and worsening shortness of breath.  Chest x-ray showing cardiomegaly with mild vascular congestion.  Patient was given 125 mg of Solu-Medrol and 1 hour continuous neb. - Admit to telemetry bed - Continuous pulse oximetry with nasal cannula oxygen to maintain O2 saturation - DuoNeb 4 times daily and prn SOB/Wheezing - Budesonide and Brovana Nebs - Solu-Medrol IV - Empiric antibiotics of doxycycline  History of congestive heart failure: Patient does not appear to be grossly fluid overload.  However, on chest x-ray patient noted to have cardiomegaly with mild vascular congestion.  BNP does not appear to be significantly elevated at 87.6. No records of the patient's last echocardiogram. - Strict I&O's and daily weights - Lasix 20 mg IV x1 dose now - May warrant further  investigation  Essential hypertension - Continue clonidine, Coreg, hydralazine, nifedipine, and furosemide  Anxiety/depression/schizoaffective disorder - Continue Depakote, Latuda, Ativan,  Hyponatremia: Chronic.  Patient on sodium tablets. - Continue sodium chloride tablets  Hyperlipidemia - continue pravastatin  Diabetes mellitus type 2: Patient currently not on any medications for treatment. - Hypoglycemic protocol - CBGs q. before meals with sensitive SSI - Adjust regimen as needed  Chronic pain - Continue patient's home pain medication regimen of oxycodone  GERD - Continue pharmacy substitution of Protonix  DVT prophylaxis: Lovenox   Code Status: Full  Family Communication: None Disposition Plan: Likely discharge back to [place] once medically stable Consults called: None Admission status: inpatient   Norval Morton MD [facility] Pager [phone]   If 7PM-7AM, please contact night-coverage [email or web address] Password [email or web address]  10/07/2017, 12:05 AM

## 2017-10-07 NOTE — Progress Notes (Signed)
[facility] PROGRESS NOTE    Progress Note  Natalie Thomas  EXH:[id number] DOB: [birth year] DOA: 10/06/2017 PCP: Renato Shin, MD     Brief Narrative:   Natalie Thomas is an 57 y.o. female past medical history of hypertension COPD oxygen dependent, diet-controlled diabetes mellitus type 2 presented with worsening shortness of breath over the last week, with productive cough generalized malaise wheezing  Assessment/Plan:   Acute on Chronic respiratory failure with hypoxia ([place]) due to COPD exacerbation: Continue IV steroids inhalers and antibiotics. Discontinue telemetry. We will try her today without oxygen History of she does not appear fluid overloaded on physical exam.  Essential hypertension: Continue current meds no changes made.  Anxiety/depression/schizoaffective disorder: Continue Depakote Voltaren and Ativan.  Hyponatremia chronic: Appears to be stable. Will need follow-up as an outpatient question of due to primary pulmonary lung condition  Upper lipidemia: Continue statins.  Diabetes mellitus type 2: Continue sliding scale insulin.  Chronic pain: Continue current home medication.  Schizoaffective disorder, bipolar type ([place]) Cont home meds.     DVT prophylaxis: lovenox Family Communication:none  Disposition Plan/Barrier to D/C: home in 1-2 days Code Status:     Code Status Orders  (From admission, onward)        Start     Ordered   10/07/17 0029  Full code  Continuous     10/07/17 0029    Code Status History    Date Active Date Inactive Code Status Order ID Comments User Context   07/31/2017 0309 08/12/2017 1840 Full Code [id number]  Damita Lack, MD ED   08/18/2011 2311 08/21/2011 1248 Full Code [id number]  Mackie Pai., MD ED   08/18/2011 1803 08/18/2011 1819 Full Code [id number]  Linus Mako, [facility]   07/21/2011 0310 07/24/2011 2323 Full Code [id number]  Garald Balding, NP ED   07/02/2011 1258 07/04/2011 1914 Full Code  [id number]  Sheliah Mends, PA-C ED   06/16/2011 0606 06/17/2011 2024 Full Code [id number]  Linus Mako, PA ED        IV Access:    Peripheral IV   Procedures and diagnostic studies:   Dg Chest 2 View  Result Date: 10/06/2017 CLINICAL DATA:  Short of breath EXAM: CHEST - 2 VIEW COMPARISON:  07/30/2017 FINDINGS: Cardiomegaly with mild central congestion. Subsegmental atelectasis at the left base. No pleural effusion. Aortic atherosclerosis. No pneumothorax. IMPRESSION: Cardiomegaly with mild vascular congestion. Electronically Signed   By: Donavan Foil M.D.   On: 10/06/2017 23:16   Ct Abdomen Pelvis W Contrast  Result Date: 10/06/2017 CLINICAL DATA:  Shortness of breath, productive cough, epigastric abdominal pain, nausea, urinary frequency EXAM: CT ABDOMEN AND PELVIS WITH CONTRAST TECHNIQUE: Multidetector CT imaging of the abdomen and pelvis was performed using the standard protocol following bolus administration of intravenous contrast. CONTRAST:  158mL ISOVUE-300 IOPAMIDOL (ISOVUE-300) INJECTION 61% COMPARISON:  08/03/2017 FINDINGS: Lower chest: Mild linear scarring/atelectasis in the bilateral lower lobes. The heart is top-normal in size.  Small pericardial effusion. Hepatobiliary: Liver is within normal limits. Status post cholecystectomy. No intrahepatic or extrahepatic ductal dilatation. Pancreas: Within normal limits. Spleen: Within normal limits. Adrenals/Urinary Tract: Adrenal glands are within normal limits. Scarring with calyceal diverticulum in the posterior right upper kidney (series 2/image 31). 11 mm calyceal diverticulum in the anterior interpolar right kidney (series 2/image 36). Left kidney is within normal limits. No hydronephrosis. Bladder is within normal limits. Stomach/Bowel: Stomach is within normal limits. No evidence of bowel obstruction. No colonic wall thickening  or inflammatory changes. Vascular/Lymphatic: No evidence of abdominal aortic aneurysm.  Atherosclerotic calcifications of the abdominal aorta and branch vessels. No suspicious abdominopelvic lymphadenopathy. Reproductive: Uterus is within normal limits. No adnexal masses. Other: No abdominopelvic ascites. Musculoskeletal: Moderate compression fracture deformity at L2. Multiple mild superior endplate compression fracture deformities at T10-12. These are unchanged from the prior. IMPRESSION: No evidence of bowel obstruction. No CT findings to account for the patient's abdominal pain. Additional stable ancillary findings as above. Electronically Signed   By: Julian Hy M.D.   On: 10/06/2017 23:16     Medical Consultants:    None.  Anti-Infectives:   Doxy  Subjective:    Natalie Thomas she relates her breathing is better she is breathing more comfortable.  Objective:    Vitals:   10/07/17 0144 10/07/17 0519 10/07/17 0748 10/07/17 0752  BP: (!) 168/75 (!) 192/82    Pulse: 77 82    Resp: 18     Temp: 98.7 F (37.1 C) 98.3 F (36.8 C)    TempSrc: Oral Oral    SpO2: 95% 98% 96% 96%  Weight: 86.4 kg (190 lb 7.6 oz)     Height: 5\' 4"  (1.626 m)       Intake/Output Summary (Last 24 hours) at 10/07/2017 0757 Last data filed at 10/07/2017 0741 Gross per 24 hour  Intake -  Output 400 ml  Net -400 ml   Filed Weights   10/07/17 0144  Weight: 86.4 kg (190 lb 7.6 oz)    Exam: General exam: In no acute distress. Respiratory system: Good air movement with wheezing bilaterally. Cardiovascular system: S1 & S2 heard, RRR.   Gastrointestinal system: Abdomen is nondistended, soft and nontender.  Central nervous system: Alert and oriented. No focal neurological deficits. Extremities: No pedal edema. Skin: No rashes, lesions or ulcers Psychiatry: Judgement and insight appear normal. Mood & affect appropriate.    Data Reviewed:    Labs: Basic Metabolic Panel: Recent Labs  Lab 10/06/17 2046 10/07/17 0651  NA 131* 131*  K 3.7 4.2  CL 89* 93*  CO2 30 28    GLUCOSE 93 166*  BUN 8 9  CREATININE 0.47 0.53  CALCIUM 9.5 9.2  MG  --  1.7   GFR Estimated Creatinine Clearance: 83.5 mL/min (by C-G formula based on SCr of 0.53 mg/dL). Liver Function Tests: Recent Labs  Lab 10/06/17 2046  AST 16  ALT 19  ALKPHOS 79  BILITOT 0.6  PROT 7.8  ALBUMIN 4.0   Recent Labs  Lab 10/06/17 2046  LIPASE 34   No results for input(s): AMMONIA in the last 168 hours. Coagulation profile No results for input(s): INR, PROTIME in the last 168 hours.  CBC: Recent Labs  Lab 10/06/17 2046 10/07/17 0651  WBC 10.0 6.8  NEUTROABS 6.8  --   HGB 13.3 13.0  HCT 40.5 41.1  MCV 90.2 91.1  PLT 222 228   Cardiac Enzymes: Recent Labs  Lab 10/06/17 2046 10/07/17 0036 10/07/17 0651  TROPONINI <0.03 <0.03 <0.03   BNP (last 3 results) No results for input(s): PROBNP in the last 8760 hours. CBG: Recent Labs  Lab 10/07/17 0722  GLUCAP 173*   D-Dimer: No results for input(s): DDIMER in the last 72 hours. Hgb A1c: No results for input(s): HGBA1C in the last 72 hours. Lipid Profile: No results for input(s): CHOL, HDL, LDLCALC, TRIG, CHOLHDL, LDLDIRECT in the last 72 hours. Thyroid function studies: No results for input(s): TSH, T4TOTAL, T3FREE, THYROIDAB in the last  72 hours.  Invalid input(s): FREET3 Anemia work up: No results for input(s): VITAMINB12, FOLATE, FERRITIN, TIBC, IRON, RETICCTPCT in the last 72 hours. Sepsis Labs: Recent Labs  Lab 10/06/17 2046 10/06/17 2107 10/07/17 0651  WBC 10.0  --  6.8  LATICACIDVEN  --  0.61  --    Microbiology Recent Results (from the past 240 hour(s))  MRSA PCR Screening     Status: None   Collection Time: 10/07/17  5:56 AM  Result Value Ref Range Status   MRSA by PCR NEGATIVE NEGATIVE Final    Comment:        The GeneXpert MRSA Assay (FDA approved for NASAL specimens only), is one component of a comprehensive MRSA colonization surveillance program. It is not intended to diagnose  MRSA infection nor to guide or monitor treatment for MRSA infections. Performed at [facility], [place] [address] [place], [place] [address]      Medications:   . albuterol  5 mg Nebulization Once  . ALPRAZolam  0.5 mg Oral TID  . arformoterol  15 mcg Nebulization BID  . aspirin EC  81 mg Oral Daily  . budesonide (PULMICORT) nebulizer solution  0.5 mg Nebulization BID  . carvedilol  25 mg Oral BID WC  . cloNIDine  0.3 mg Transdermal Weekly  . divalproex  250 mg Oral Q12H  . doxycycline  100 mg Oral Q12H  . enoxaparin (LOVENOX) injection  40 mg Subcutaneous Q24H  . ferrous sulfate  325 mg Oral Q breakfast  . fluticasone  2 spray Each Nare BID  . furosemide  40 mg Oral Daily  . hydrALAZINE  100 mg Oral Q8H  . insulin aspart  0-9 Units Subcutaneous TID WC  . iopamidol      . ipratropium-albuterol  3 mL Nebulization TID  . lurasidone  80 mg Oral Q breakfast  . magnesium oxide  800 mg Oral BID  . Melatonin  10 mg Oral QHS  . methylPREDNISolone (SOLU-MEDROL) injection  40 mg Intravenous Q8H  . NIFEdipine  90 mg Oral Daily  . pantoprazole  40 mg Oral BID  . PARoxetine  20 mg Oral Daily  . pravastatin  20 mg Oral Daily  . sodium chloride      . sodium chloride  1 g Oral TID WC  . sucralfate  1 g Oral QID   Continuous Infusions:    LOS: 0 days   Charlynne Cousins  [facility] Pager [phone]  *Please refer to [email or web address], password [email or web address] to get updated schedule on who will round on this patient, as hospitalists switch teams weekly. If 7PM-7AM, please contact night-coverage at [email or web address], password [email or web address] for any overnight needs.  10/07/2017, 7:57 AM

## 2017-10-08 DIAGNOSIS — J9621 Acute and chronic respiratory failure with hypoxia: Secondary | ICD-10-CM | POA: Diagnosis not present

## 2017-10-08 DIAGNOSIS — I10 Essential (primary) hypertension: Secondary | ICD-10-CM | POA: Diagnosis not present

## 2017-10-08 DIAGNOSIS — F250 Schizoaffective disorder, bipolar type: Secondary | ICD-10-CM | POA: Diagnosis not present

## 2017-10-08 DIAGNOSIS — J441 Chronic obstructive pulmonary disease with (acute) exacerbation: Secondary | ICD-10-CM | POA: Diagnosis not present

## 2017-10-08 LAB — GLUCOSE, CAPILLARY
GLUCOSE-CAPILLARY: 168 mg/dL — AB (ref 65–99)
Glucose-Capillary: 161 mg/dL — ABNORMAL HIGH (ref 65–99)

## 2017-10-08 MED ORDER — DOXYCYCLINE HYCLATE 100 MG PO TABS: 100.0000 mg | ORAL_TABLET | Freq: Two times a day (BID) | ORAL | 0 refills | Status: DC

## 2017-10-08 MED ORDER — PREDNISONE 10 MG PO TABS: ORAL_TABLET | ORAL | 0 refills | Status: DC

## 2017-10-08 NOTE — Progress Notes (Addendum)
Pt resident of [facility]- plan to return at DC. Report [id number] Pt has [place] [phone]- CSW left voicemail to update. Pt states she told guardian this morning plan to DC today. CSW provided DC summary to ALF and sent FL2 for review. Once ALF advises paperwork received and they are prepared for pt's return, will arrange transportation.  Sharren Bridge, MSW, LCSW Clinical Social Work 10/08/2017 [phone]  ALF advises FL2 and DC summary received.  Pt reports her guardian was informed this morning of her plan to return to [place] left voicemail and will update him upon returned call.

## 2017-10-08 NOTE — NC FL2 (Addendum)
MEDICAID FL2 LEVEL OF CARE SCREENING TOOL     IDENTIFICATION  Patient Name: Natalie Thomas Birthdate: 02/16/1961 Sex: female Admission Date (Current Location): 10/06/2017  [facility] and [place] Number:  Herbalist and Address:  [facility],  [place], [place]      Provider Number: [id number]  Attending Physician Name and Address:  Charlynne Cousins, MD  Relative Name and Phone Number:  [phone] Legal guardian [place]    Current Level of Care: Hospital Recommended Level of Care: [place] Prior Approval Number:    Date Approved/Denied:   PASRR Number:    Discharge Plan: Other (Comment)(Assisted Living)    Current Diagnoses: Patient Active Problem List   Diagnosis Date Noted  . COPD exacerbation ([place]) 10/07/2017  . Hyperlipidemia 10/07/2017  . Chronic respiratory failure with hypoxia ([place]) 10/07/2017  . Acute on chronic respiratory failure with hypoxia and hypercapnia ([place]) 10/07/2017  . Incontinence of urine 08/14/2011  . Hyponatremia 07/27/2011  . Schizoaffective disorder, bipolar type ([place]) 07/05/2011  . Cough 05/08/2011  . Encounter for long-term (current) use of other medications 04/09/2011  . Lumbar disc disease 04/09/2011  . Polycythemia secondary to smoking 04/09/2011  . HIP PAIN, LEFT 12/12/2007  . 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 DIABETES MELLITUS, TYPE II 04/08/2007  . MENOPAUSE, PREMATURE 04/08/2007  . Chronic hyponatremia 04/08/2007  . SCHIZOAFFECTIVE DISORDER 04/08/2007  . DEPRESSION 04/08/2007  . Essential hypertension 04/08/2007  . COPD 04/08/2007  . GERD 04/08/2007  . OSTEOPOROSIS 04/08/2007  . DYSURIA 04/08/2007    Orientation RESPIRATION BLADDER Height & Weight     Self, Time, Situation, Place  O2(2L) Incontinent Weight: 194 lb 14.2 oz (88.4 kg) Height:  5\' 4"  (162.6 cm)  BEHAVIORAL SYMPTOMS/MOOD NEUROLOGICAL BOWEL NUTRITION STATUS      Continent  Diet(mechanical soft)  AMBULATORY STATUS COMMUNICATION OF NEEDS Skin   Limited Assist(uses walker) Verbally Normal                       Personal Care Assistance Level of Assistance              Functional Limitations Info  Sight, Hearing, Speech Sight Info: Adequate Hearing Info: Adequate Speech Info: Adequate    SPECIAL CARE FACTORS FREQUENCY  PT (By licensed PT), OT (By licensed OT)     PT Frequency: 3x OT Frequency: 3x            Contractures Contractures Info: Not present    Additional Factors Info  Code Status, Allergies Code Status Info: full Allergies Info: Clarithromycin, Penicillins           Current Medications (10/08/2017):  This is the current hospital active medication list Current Facility-Administered Medications  Medication Dose Route Frequency Provider Last Rate Last Dose  . acetaminophen (TYLENOL) tablet 650 mg  650 mg Oral Q6H PRN Fuller Plan A, MD       Or  . acetaminophen (TYLENOL) suppository 650 mg  650 mg Rectal Q6H PRN Smith, Rondell A, MD      . albuterol (PROVENTIL) (2.5 MG/3ML) 0.083% nebulizer solution 5 mg  5 mg Nebulization Once Fuller Plan A, MD      . ALPRAZolam Duanne Moron) tablet 0.5 mg  0.5 mg Oral TID Fuller Plan A, MD   0.5 mg at 10/08/17 0914  . arformoterol (BROVANA) nebulizer solution 15 mcg  15 mcg Nebulization BID Fuller Plan A, MD   15 mcg at 10/08/17  5366  . aspirin EC tablet 81 mg  81 mg Oral Daily Fuller Plan A, MD   81 mg at 10/08/17 0914  . budesonide (PULMICORT) nebulizer solution 0.5 mg  0.5 mg Nebulization BID Fuller Plan A, MD   0.5 mg at 10/08/17 0821  . carvedilol (COREG) tablet 25 mg  25 mg Oral BID WC Smith, Rondell A, MD   25 mg at 10/08/17 0900  . cloNIDine (CATAPRES - Dosed in mg/24 hr) patch 0.3 mg  0.3 mg Transdermal Weekly Smith, Rondell A, MD   0.3 mg at 10/07/17 1006  . divalproex (DEPAKOTE) DR tablet 250 mg  250 mg Oral Q12H Smith, Rondell A, MD   250 mg at 10/08/17 0914  .  doxycycline (VIBRA-TABS) tablet 100 mg  100 mg Oral Q12H Smith, Rondell A, MD   100 mg at 10/08/17 0913  . enoxaparin (LOVENOX) injection 40 mg  40 mg Subcutaneous Q24H Fuller Plan A, MD   40 mg at 10/08/17 0914  . ferrous sulfate tablet 325 mg  325 mg Oral Q breakfast Fuller Plan A, MD   325 mg at 10/08/17 0900  . fluticasone (FLONASE) 50 MCG/ACT nasal spray 2 spray  2 spray Each Nare BID Fuller Plan A, MD   2 spray at 10/08/17 0917  . furosemide (LASIX) tablet 40 mg  40 mg Oral Daily Fuller Plan A, MD   40 mg at 10/08/17 0915  . guaiFENesin (ROBITUSSIN) 100 MG/5ML solution 200 mg  200 mg Oral TID PRN Fuller Plan A, MD   200 mg at 10/07/17 2128  . hydrALAZINE (APRESOLINE) injection 10 mg  10 mg Intravenous Q4H PRN Fuller Plan A, MD   10 mg at 10/07/17 1015  . hydrALAZINE (APRESOLINE) tablet 100 mg  100 mg Oral Q8H Smith, Rondell A, MD   100 mg at 10/08/17 0530  . insulin aspart (novoLOG) injection 0-9 Units  0-9 Units Subcutaneous TID WC Norval Morton, MD   2 Units at 10/08/17 1223  . ipratropium-albuterol (DUONEB) 0.5-2.5 (3) MG/3ML nebulizer solution 3 mL  3 mL Nebulization Q4H PRN Fuller Plan A, MD   3 mL at 10/08/17 0540  . ipratropium-albuterol (DUONEB) 0.5-2.5 (3) MG/3ML nebulizer solution 3 mL  3 mL Nebulization TID Charlynne Cousins, MD   3 mL at 10/08/17 4403  . lurasidone (LATUDA) tablet 80 mg  80 mg Oral Q breakfast Tamala Julian, Rondell A, MD   80 mg at 10/08/17 0900  . magnesium oxide (MAG-OX) tablet 800 mg  800 mg Oral BID Fuller Plan A, MD   800 mg at 10/08/17 0915  . Melatonin TABS 10 mg  10 mg Oral QHS Fuller Plan A, MD   10 mg at 10/07/17 2120  . methylPREDNISolone sodium succinate (SOLU-MEDROL) 40 mg/mL injection 40 mg  40 mg Intravenous Q8H Smith, Rondell A, MD   40 mg at 10/08/17 0529  . neomycin-bacitracin-polymyxin (NEOSPORIN) ointment 1 application  1 application Topical Daily PRN Fuller Plan A, MD      . NIFEdipine  (PROCARDIA-XL/ADALAT-CC/NIFEDICAL-XL) 24 hr tablet 90 mg  90 mg Oral Daily Tamala Julian, Rondell A, MD   90 mg at 10/08/17 0921  . ondansetron (ZOFRAN) tablet 4 mg  4 mg Oral Q6H PRN Fuller Plan A, MD   4 mg at 10/07/17 1253   Or  . ondansetron (ZOFRAN) injection 4 mg  4 mg Intravenous Q6H PRN Smith, Rondell A, MD      . oxyCODONE (Oxy IR/ROXICODONE) immediate release tablet 5 mg  5 mg Oral Q6H PRN Fuller Plan A, MD   5 mg at 10/08/17 9326  . pantoprazole (PROTONIX) EC tablet 40 mg  40 mg Oral BID Fuller Plan A, MD   40 mg at 10/08/17 0913  . PARoxetine (PAXIL) tablet 20 mg  20 mg Oral Daily Tamala Julian, Rondell A, MD   20 mg at 10/08/17 0913  . pravastatin (PRAVACHOL) tablet 20 mg  20 mg Oral Daily Tamala Julian, Rondell A, MD   20 mg at 10/08/17 0913  . sodium chloride tablet 1 g  1 g Oral TID WC Smith, Rondell A, MD   1 g at 10/08/17 0900  . sucralfate (CARAFATE) tablet 1 g  1 g Oral QID Fuller Plan A, MD   1 g at 10/08/17 0913     Discharge Medications: TAKE these medications   acetaminophen 325 MG tablet Commonly known as:  TYLENOL Take 650 mg by mouth every 6 (six) hours as needed for mild pain.   albuterol 108 (90 Base) MCG/ACT inhaler Commonly known as:  PROVENTIL HFA;VENTOLIN HFA Inhale 2 puffs into the lungs every 4 (four) hours as needed for wheezing or shortness of breath.   ALPRAZolam 0.5 MG tablet Commonly known as:  XANAX Take 1 tablet (0.5 mg total) by mouth 3 (three) times daily.   aspirin 81 MG tablet Take 1 tablet (81 mg total) by mouth daily. For heart health   BREO ELLIPTA 100-25 MCG/INH Aepb Generic drug:  fluticasone furoate-vilanterol Inhale 1 puff into the lungs daily.   buPROPion 150 MG 24 hr tablet Commonly known as:  WELLBUTRIN XL Take 1 tablet (150 mg total) by mouth daily.   carvedilol 25 MG tablet Commonly known as:  COREG Take 25 mg by mouth 2 (two) times daily with a meal.   cloNIDine 0.3 mg/24hr patch Commonly known as:  CATAPRES - Dosed in  mg/24 hr Place 0.3 mg onto the skin once a week.   DAILY-VITE PO Take 1 tablet by mouth daily.   dextromethorphan-guaiFENesin 30-600 MG 12hr tablet Commonly known as:  MUCINEX DM Take 1 tablet by mouth every 12 (twelve) hours as needed for cough.   divalproex 250 MG DR tablet Commonly known as:  DEPAKOTE Take 1 tablet (250 mg total) by mouth every 12 (twelve) hours.   doxycycline 100 MG tablet Commonly known as:  VIBRA-TABS Take 1 tablet (100 mg total) by mouth every 12 (twelve) hours.   ferrous sulfate 325 (65 FE) MG tablet Take 325 mg by mouth daily with breakfast.   fluticasone 50 MCG/ACT nasal spray Commonly known as:  FLONASE Place 2 sprays into both nostrils 2 (two) times daily.   furosemide 40 MG tablet Commonly known as:  LASIX Take 1 tablet (40 mg total) by mouth daily.   guaifenesin 100 MG/5ML syrup Commonly known as:  ROBITUSSIN Take 200 mg by mouth 3 (three) times daily as needed for cough.   hydrALAZINE 100 MG tablet Commonly known as:  APRESOLINE Take 100 mg by mouth every 8 (eight) hours.   ipratropium-albuterol 0.5-2.5 (3) MG/3ML Soln Commonly known as:  DUONEB Take 3 mLs by nebulization 3 (three) times daily as needed (SOb).   loperamide 2 MG capsule Commonly known as:  IMODIUM Take 2 mg by mouth as needed for diarrhea or loose stools.   lurasidone 80 MG Tabs tablet Commonly known as:  LATUDA Take 1 tablet (80 mg total) by mouth daily with breakfast.   magnesium hydroxide 400 MG/5ML suspension Commonly known as:  MILK  OF MAGNESIA Take 30 mLs by mouth at bedtime as needed for mild constipation.   magnesium oxide 400 (241.3 Mg) MG tablet Commonly known as:  MAG-OX Take 2 tablets (800 mg total) by mouth 2 (two) times daily.   Melatonin 10 MG Tabs Take 10 mg by mouth at bedtime.   [place] 200-200-20 MG/5ML suspension Generic drug:  alum & mag hydroxide-simeth Take 30 mLs by mouth every 6 (six) hours as needed for indigestion or  heartburn.   neomycin-bacitracin-polymyxin Oint Commonly known as:  NEOSPORIN Apply 1 application topically daily as needed for wound care.   NIFEdipine 90 MG 24 hr tablet Commonly known as:  PROCARDIA XL/ADALAT-CC Take 90 mg by mouth daily.   omeprazole 20 MG capsule Commonly known as:  PRILOSEC Take 1 capsule (20 mg total) by mouth daily. For acid reflux. What changed:    when to take this  additional instructions   oxyCODONE 5 MG immediate release tablet Commonly known as:  Oxy IR/ROXICODONE Take 5 mg by mouth every 6 (six) hours as needed for severe pain.   oxyCODONE-acetaminophen 5-325 MG tablet Commonly known as:  PERCOCET/ROXICET Take 1 tablet by mouth every 4 (four) hours as needed for moderate pain.   PARoxetine 20 MG tablet Commonly known as:  PAXIL Take 2 tablets (40 mg total) by mouth daily. What changed:  how much to take   polyethylene glycol packet Commonly known as:  MIRALAX Take 17 g by mouth daily.   pravastatin 20 MG tablet Commonly known as:  PRAVACHOL Take 20 mg by mouth daily.   predniSONE 10 MG tablet Commonly known as:  DELTASONE Take 10 mg by mouth daily with breakfast. What changed:  Another medication with the same name was added. Make sure you understand how and when to take each.   predniSONE 10 MG tablet Commonly known as:  DELTASONE Takes 6 tablets for 1 days, then 5 tablets for 1 days, then 4 tablets for 1 days, then 3 tablets for 1 days, then 2 tabs for 1 days, then 1 tab for 1 days, and then stop. What changed:  You were already taking a medication with the same name, and this prescription was added. Make sure you understand how and when to take each.   promethazine 25 MG tablet Commonly known as:  PHENERGAN Take 25 mg by mouth every 6 (six) hours as needed for nausea or vomiting.   senna 8.6 MG Tabs tablet Commonly known as:  SENOKOT Take 1 tablet (8.6 mg total) by mouth daily.   sodium chloride 1 g tablet Take  1 g by mouth 3 (three) times daily with meals.   sucralfate 1 g tablet Commonly known as:  CARAFATE Take 1 g by mouth 4 (four) times daily.      Relevant Imaging Results:  Relevant Lab Results:   Additional Information SS#: [id number]  Nila Nephew, LCSW

## 2017-10-08 NOTE — Progress Notes (Signed)
SATURATION QUALIFICATIONS:  Patient Saturations on Room Air at Rest = 91 %  Patient Saturations on Room Air while Ambulating = 88 %  Patient Saturations on 2 Liters of oxygen while Ambulating = 97 %  Please briefly explain why patient needs home oxygen: With activity pt became short of breath. Saturation decreased.

## 2017-10-08 NOTE — Progress Notes (Signed)
Report called to Santiago Glad, [phone]. Questions concerns denied.

## 2017-10-08 NOTE — Discharge Summary (Signed)
Physician Discharge Summary  VONNETTA AKEY AYT:[id number] DOB: 1960/10/19 DOA: 10/06/2017  PCP: Renato Shin, MD  Admit date: 10/06/2017 Discharge date: 10/08/2017  Admitted From: ALF Disposition:  ALF  Recommendations for Outpatient Follow-up:  1. Follow up with PCP in 1-2 weeks 2. Please obtain BMP/CBC in one week   Home Health:No Equipment/Devices:None  Discharge Condition:stable CODE STATUS:full Diet recommendation: Heart Healthy  Brief/Interim Summary: 57 y.o. female past medical history of hypertension COPD oxygen dependent, diet-controlled diabetes mellitus type 2 presented with worsening shortness of breath over the last week, with productive cough generalized malaise wheezing    Discharge Diagnoses:  Principal Problem:   COPD exacerbation ([place]) Active Problems:   Chronic hyponatremia   Essential hypertension   Schizoaffective disorder, bipolar type (HCC)   Hyperlipidemia   Chronic respiratory failure with hypoxia (HCC)   Acute on chronic respiratory failure with hypoxia and hypercapnia (HCC)  Acute on Chronic respiratory failure with hypoxia (HCC) due to COPD exacerbation: Started on IV steroids inhalers and antibiotics within 2 days she was much improved. We will continue inhalers antibiotics and oral steroids as an outpatient with a steroid taper for 1 week.  Essential hypertension: Continue current meds no changes made.  Anxiety/depression/schizoaffective disorder: Continue Depakote Voltaren and Ativan.  Hyponatremia chronic: Appears to be stable. Will need follow-up as an outpatient, with PCP.  dyslipidemia: Continue statins.  Diabetes mellitus type 2: Current medication no changes made.    Discharge Instructions  Discharge Instructions    Diet - low sodium heart healthy   Complete by:  As directed    Increase activity slowly   Complete by:  As directed      Allergies as of 10/08/2017      Reactions   Clarithromycin Itching    Penicillins Itching   Has patient had a PCN reaction causing immediate rash, facial/tongue/throat swelling, SOB or lightheadedness with hypotension: No Has patient had a PCN reaction causing severe rash involving mucus membranes or skin necrosis: No Has patient had a PCN reaction that required hospitalization: Unknown Has patient had a PCN reaction occurring within the last 10 years: No If all of the above answers are "NO", then may proceed with Cephalosporin use.      Medication List    TAKE these medications   acetaminophen 325 MG tablet Commonly known as:  TYLENOL Take 650 mg by mouth every 6 (six) hours as needed for mild pain.   albuterol 108 (90 Base) MCG/ACT inhaler Commonly known as:  PROVENTIL HFA;VENTOLIN HFA Inhale 2 puffs into the lungs every 4 (four) hours as needed for wheezing or shortness of breath.   ALPRAZolam 0.5 MG tablet Commonly known as:  XANAX Take 1 tablet (0.5 mg total) by mouth 3 (three) times daily.   aspirin 81 MG tablet Take 1 tablet (81 mg total) by mouth daily. For heart health   BREO ELLIPTA 100-25 MCG/INH Aepb Generic drug:  fluticasone furoate-vilanterol Inhale 1 puff into the lungs daily.   buPROPion 150 MG 24 hr tablet Commonly known as:  WELLBUTRIN XL Take 1 tablet (150 mg total) by mouth daily.   carvedilol 25 MG tablet Commonly known as:  COREG Take 25 mg by mouth 2 (two) times daily with a meal.   cloNIDine 0.3 mg/24hr patch Commonly known as:  CATAPRES - Dosed in mg/24 hr Place 0.3 mg onto the skin once a week.   DAILY-VITE PO Take 1 tablet by mouth daily.   dextromethorphan-guaiFENesin 30-600 MG 12hr tablet Commonly known as:  MUCINEX DM Take 1 tablet by mouth every 12 (twelve) hours as needed for cough.   divalproex 250 MG DR tablet Commonly known as:  DEPAKOTE Take 1 tablet (250 mg total) by mouth every 12 (twelve) hours.   doxycycline 100 MG tablet Commonly known as:  VIBRA-TABS Take 1 tablet (100 mg total) by mouth  every 12 (twelve) hours.   ferrous sulfate 325 (65 FE) MG tablet Take 325 mg by mouth daily with breakfast.   fluticasone 50 MCG/ACT nasal spray Commonly known as:  FLONASE Place 2 sprays into both nostrils 2 (two) times daily.   furosemide 40 MG tablet Commonly known as:  LASIX Take 1 tablet (40 mg total) by mouth daily.   guaifenesin 100 MG/5ML syrup Commonly known as:  ROBITUSSIN Take 200 mg by mouth 3 (three) times daily as needed for cough.   hydrALAZINE 100 MG tablet Commonly known as:  APRESOLINE Take 100 mg by mouth every 8 (eight) hours.   ipratropium-albuterol 0.5-2.5 (3) MG/3ML Soln Commonly known as:  DUONEB Take 3 mLs by nebulization 3 (three) times daily as needed (SOb).   loperamide 2 MG capsule Commonly known as:  IMODIUM Take 2 mg by mouth as needed for diarrhea or loose stools.   lurasidone 80 MG Tabs tablet Commonly known as:  LATUDA Take 1 tablet (80 mg total) by mouth daily with breakfast.   magnesium hydroxide 400 MG/5ML suspension Commonly known as:  MILK OF MAGNESIA Take 30 mLs by mouth at bedtime as needed for mild constipation.   magnesium oxide 400 (241.3 Mg) MG tablet Commonly known as:  MAG-OX Take 2 tablets (800 mg total) by mouth 2 (two) times daily.   Melatonin 10 MG Tabs Take 10 mg by mouth at bedtime.   Dongola 200-200-20 MG/5ML suspension Generic drug:  alum & mag hydroxide-simeth Take 30 mLs by mouth every 6 (six) hours as needed for indigestion or heartburn.   neomycin-bacitracin-polymyxin Oint Commonly known as:  NEOSPORIN Apply 1 application topically daily as needed for wound care.   NIFEdipine 90 MG 24 hr tablet Commonly known as:  PROCARDIA XL/ADALAT-CC Take 90 mg by mouth daily.   omeprazole 20 MG capsule Commonly known as:  PRILOSEC Take 1 capsule (20 mg total) by mouth daily. For acid reflux. What changed:    when to take this  additional instructions   oxyCODONE 5 MG immediate release tablet Commonly known  as:  Oxy IR/ROXICODONE Take 5 mg by mouth every 6 (six) hours as needed for severe pain.   oxyCODONE-acetaminophen 5-325 MG tablet Commonly known as:  PERCOCET/ROXICET Take 1 tablet by mouth every 4 (four) hours as needed for moderate pain.   PARoxetine 20 MG tablet Commonly known as:  PAXIL Take 2 tablets (40 mg total) by mouth daily. What changed:  how much to take   polyethylene glycol packet Commonly known as:  MIRALAX Take 17 g by mouth daily.   pravastatin 20 MG tablet Commonly known as:  PRAVACHOL Take 20 mg by mouth daily.   predniSONE 10 MG tablet Commonly known as:  DELTASONE Take 10 mg by mouth daily with breakfast. What changed:  Another medication with the same name was added. Make sure you understand how and when to take each.   predniSONE 10 MG tablet Commonly known as:  DELTASONE Takes 6 tablets for 1 days, then 5 tablets for 1 days, then 4 tablets for 1 days, then 3 tablets for 1 days, then 2 tabs for 1 days, then 1  tab for 1 days, and then stop. What changed:  You were already taking a medication with the same name, and this prescription was added. Make sure you understand how and when to take each.   promethazine 25 MG tablet Commonly known as:  PHENERGAN Take 25 mg by mouth every 6 (six) hours as needed for nausea or vomiting.   senna 8.6 MG Tabs tablet Commonly known as:  SENOKOT Take 1 tablet (8.6 mg total) by mouth daily.   sodium chloride 1 g tablet Take 1 g by mouth 3 (three) times daily with meals.   sucralfate 1 g tablet Commonly known as:  CARAFATE Take 1 g by mouth 4 (four) times daily.       Allergies  Allergen Reactions  . Clarithromycin Itching  . Penicillins Itching    Has patient had a PCN reaction causing immediate rash, facial/tongue/throat swelling, SOB or lightheadedness with hypotension: No Has patient had a PCN reaction causing severe rash involving mucus membranes or skin necrosis: No Has patient had a PCN reaction that  required hospitalization: Unknown Has patient had a PCN reaction occurring within the last 10 years: No If all of the above answers are "NO", then may proceed with Cephalosporin use.     Consultations:  None   Procedures/Studies: Dg Chest 2 View  Result Date: 10/06/2017 CLINICAL DATA:  Short of breath EXAM: CHEST - 2 VIEW COMPARISON:  07/30/2017 FINDINGS: Cardiomegaly with mild central congestion. Subsegmental atelectasis at the left base. No pleural effusion. Aortic atherosclerosis. No pneumothorax. IMPRESSION: Cardiomegaly with mild vascular congestion. Electronically Signed   By: Donavan Foil M.D.   On: 10/06/2017 23:16   Ct Abdomen Pelvis W Contrast  Result Date: 10/06/2017 CLINICAL DATA:  Shortness of breath, productive cough, epigastric abdominal pain, nausea, urinary frequency EXAM: CT ABDOMEN AND PELVIS WITH CONTRAST TECHNIQUE: Multidetector CT imaging of the abdomen and pelvis was performed using the standard protocol following bolus administration of intravenous contrast. CONTRAST:  156mL ISOVUE-300 IOPAMIDOL (ISOVUE-300) INJECTION 61% COMPARISON:  08/03/2017 FINDINGS: Lower chest: Mild linear scarring/atelectasis in the bilateral lower lobes. The heart is top-normal in size.  Small pericardial effusion. Hepatobiliary: Liver is within normal limits. Status post cholecystectomy. No intrahepatic or extrahepatic ductal dilatation. Pancreas: Within normal limits. Spleen: Within normal limits. Adrenals/Urinary Tract: Adrenal glands are within normal limits. Scarring with calyceal diverticulum in the posterior right upper kidney (series 2/image 31). 11 mm calyceal diverticulum in the anterior interpolar right kidney (series 2/image 36). Left kidney is within normal limits. No hydronephrosis. Bladder is within normal limits. Stomach/Bowel: Stomach is within normal limits. No evidence of bowel obstruction. No colonic wall thickening or inflammatory changes. Vascular/Lymphatic: No evidence of  abdominal aortic aneurysm. Atherosclerotic calcifications of the abdominal aorta and branch vessels. No suspicious abdominopelvic lymphadenopathy. Reproductive: Uterus is within normal limits. No adnexal masses. Other: No abdominopelvic ascites. Musculoskeletal: Moderate compression fracture deformity at L2. Multiple mild superior endplate compression fracture deformities at T10-12. These are unchanged from the prior. IMPRESSION: No evidence of bowel obstruction. No CT findings to account for the patient's abdominal pain. Additional stable ancillary findings as above. Electronically Signed   By: Julian Hy M.D.   On: 10/06/2017 23:16    Subjective: No complaints feels great she relates her breathing is much improved.  Discharge Exam: Vitals:   10/08/17 0822 10/08/17 0825  BP:    Pulse:    Resp:    Temp:    SpO2: 95% 95%   Vitals:   10/08/17 0540  10/08/17 0623 10/08/17 0822 10/08/17 0825  BP:  (!) 162/89    Pulse:  65    Resp:      Temp:      TempSrc:      SpO2: 96%  95% 95%  Weight:      Height:        General: Pt is alert, awake, not in acute distress Cardiovascular: RRR, S1/S2 +, no rubs, no gallops Respiratory: CTA bilaterally, no wheezing, no rhonchi Abdominal: Soft, NT, ND, bowel sounds + Extremities: no edema, no cyanosis    The results of significant diagnostics from this hospitalization (including imaging, microbiology, ancillary and laboratory) are listed below for reference.     Microbiology: Recent Results (from the past 240 hour(s))  MRSA PCR Screening     Status: None   Collection Time: 10/07/17  5:56 AM  Result Value Ref Range Status   MRSA by PCR NEGATIVE NEGATIVE Final    Comment:        The GeneXpert MRSA Assay (FDA approved for NASAL specimens only), is one component of a comprehensive MRSA colonization surveillance program. It is not intended to diagnose MRSA infection nor to guide or monitor treatment for MRSA infections. Performed at  [facility], [place] [address], [place], [place] [address]      Labs: BNP (last 3 results) Recent Labs    07/31/17 0346 10/06/17 2047  BNP 88.1 15.1   Basic Metabolic Panel: Recent Labs  Lab 10/06/17 2046 10/07/17 0651  NA 131* 131*  K 3.7 4.2  CL 89* 93*  CO2 30 28  GLUCOSE 93 166*  BUN 8 9  CREATININE 0.47 0.53  CALCIUM 9.5 9.2  MG  --  1.7   Liver Function Tests: Recent Labs  Lab 10/06/17 2046  AST 16  ALT 19  ALKPHOS 79  BILITOT 0.6  PROT 7.8  ALBUMIN 4.0   Recent Labs  Lab 10/06/17 2046  LIPASE 34   No results for input(s): AMMONIA in the last 168 hours. CBC: Recent Labs  Lab 10/06/17 2046 10/07/17 0651  WBC 10.0 6.8  NEUTROABS 6.8  --   HGB 13.3 13.0  HCT 40.5 41.1  MCV 90.2 91.1  PLT 222 228   Cardiac Enzymes: Recent Labs  Lab 10/06/17 2046 10/07/17 0036 10/07/17 0651 10/07/17 1253  TROPONINI <0.03 <0.03 <0.03 <0.03   BNP: Invalid input(s): POCBNP CBG: Recent Labs  Lab 10/07/17 0722 10/07/17 1125 10/07/17 1633 10/07/17 2147 10/08/17 0739  GLUCAP 173* 177* 164* 165* 168*   D-Dimer No results for input(s): DDIMER in the last 72 hours. Hgb A1c No results for input(s): HGBA1C in the last 72 hours. Lipid Profile No results for input(s): CHOL, HDL, LDLCALC, TRIG, CHOLHDL, LDLDIRECT in the last 72 hours. Thyroid function studies No results for input(s): TSH, T4TOTAL, T3FREE, THYROIDAB in the last 72 hours.  Invalid input(s): FREET3 Anemia work up No results for input(s): VITAMINB12, FOLATE, FERRITIN, TIBC, IRON, RETICCTPCT in the last 72 hours. Urinalysis    Component Value Date/Time   COLORURINE STRAW (A) 10/07/2017 0036   APPEARANCEUR CLEAR 10/07/2017 0036   APPEARANCEUR Clear 06/22/2012 0618   LABSPEC 1.008 10/07/2017 0036   LABSPEC 1.003 06/22/2012 0618   PHURINE 9.0 (H) 10/07/2017 0036   GLUCOSEU NEGATIVE 10/07/2017 0036   GLUCOSEU Negative 06/22/2012 0618   GLUCOSEU NEGATIVE 04/09/2011 1058    HGBUR NEGATIVE 10/07/2017 0036   BILIRUBINUR NEGATIVE 10/07/2017 0036   BILIRUBINUR Negative 06/22/2012 0618   KETONESUR NEGATIVE 10/07/2017  0036   PROTEINUR NEGATIVE 10/07/2017 0036   UROBILINOGEN 1.0 08/18/2011 1805   NITRITE NEGATIVE 10/07/2017 0036   LEUKOCYTESUR NEGATIVE 10/07/2017 0036   LEUKOCYTESUR Trace 06/22/2012 0618   Sepsis Labs Invalid input(s): PROCALCITONIN,  WBC,  LACTICIDVEN Microbiology Recent Results (from the past 240 hour(s))  MRSA PCR Screening     Status: None   Collection Time: 10/07/17  5:56 AM  Result Value Ref Range Status   MRSA by PCR NEGATIVE NEGATIVE Final    Comment:        The GeneXpert MRSA Assay (FDA approved for NASAL specimens only), is one component of a comprehensive MRSA colonization surveillance program. It is not intended to diagnose MRSA infection nor to guide or monitor treatment for MRSA infections. Performed at [facility], [place] [address] [place], [place] [address]      Time coordinating discharge: 35 minutes  SIGNED:   Charlynne Cousins, MD  [facility] 10/08/2017, 10:45 AM Pager   If 7PM-7AM, please contact night-coverage [email or web address] Password [email or web address]

## 2017-10-08 NOTE — Progress Notes (Signed)
Attempted to call report,  vm reached and vm left for return call for report. Will attempt again

## 2017-10-12 LAB — CULTURE, BLOOD (ROUTINE X 2)
CULTURE: NO GROWTH
Culture: NO GROWTH
SPECIAL REQUESTS: ADEQUATE

## 2017-10-16 ENCOUNTER — Inpatient Hospital Stay (HOSPITAL_COMMUNITY)
Admission: EM | Admit: 2017-10-16 | Discharge: 2017-10-19 | DRG: 191 | Disposition: A | Discharge status: Skilled Nursing Facility | Payer: Medicare Other | Attending: Internal Medicine | Admitting: Internal Medicine

## 2017-10-16 ENCOUNTER — Emergency Department (HOSPITAL_COMMUNITY): Payer: Medicare Other

## 2017-10-16 ENCOUNTER — Other Ambulatory Visit: Payer: Self-pay

## 2017-10-16 ENCOUNTER — Encounter (HOSPITAL_COMMUNITY): Payer: Self-pay | Admitting: Emergency Medicine

## 2017-10-16 ENCOUNTER — Inpatient Hospital Stay (HOSPITAL_COMMUNITY): Payer: Medicare Other

## 2017-10-16 DIAGNOSIS — D649 Anemia, unspecified: Secondary | ICD-10-CM

## 2017-10-16 DIAGNOSIS — I110 Hypertensive heart disease with heart failure: Secondary | ICD-10-CM | POA: Diagnosis present

## 2017-10-16 DIAGNOSIS — J9611 Chronic respiratory failure with hypoxia: Secondary | ICD-10-CM | POA: Diagnosis present

## 2017-10-16 DIAGNOSIS — Z7951 Long term (current) use of inhaled steroids: Secondary | ICD-10-CM

## 2017-10-16 DIAGNOSIS — Z7982 Long term (current) use of aspirin: Secondary | ICD-10-CM

## 2017-10-16 DIAGNOSIS — I5032 Chronic diastolic (congestive) heart failure: Secondary | ICD-10-CM | POA: Diagnosis present

## 2017-10-16 DIAGNOSIS — F419 Anxiety disorder, unspecified: Secondary | ICD-10-CM

## 2017-10-16 DIAGNOSIS — K219 Gastro-esophageal reflux disease without esophagitis: Secondary | ICD-10-CM | POA: Diagnosis present

## 2017-10-16 DIAGNOSIS — R0600 Dyspnea, unspecified: Secondary | ICD-10-CM

## 2017-10-16 DIAGNOSIS — J441 Chronic obstructive pulmonary disease with (acute) exacerbation: Principal | ICD-10-CM | POA: Diagnosis present

## 2017-10-16 DIAGNOSIS — F319 Bipolar disorder, unspecified: Secondary | ICD-10-CM | POA: Diagnosis present

## 2017-10-16 DIAGNOSIS — K529 Noninfective gastroenteritis and colitis, unspecified: Secondary | ICD-10-CM | POA: Diagnosis present

## 2017-10-16 DIAGNOSIS — E871 Hypo-osmolality and hyponatremia: Secondary | ICD-10-CM | POA: Diagnosis present

## 2017-10-16 DIAGNOSIS — R0602 Shortness of breath: Secondary | ICD-10-CM | POA: Diagnosis not present

## 2017-10-16 DIAGNOSIS — F39 Unspecified mood [affective] disorder: Secondary | ICD-10-CM | POA: Diagnosis not present

## 2017-10-16 DIAGNOSIS — I509 Heart failure, unspecified: Secondary | ICD-10-CM | POA: Diagnosis not present

## 2017-10-16 DIAGNOSIS — M810 Age-related osteoporosis without current pathological fracture: Secondary | ICD-10-CM | POA: Diagnosis present

## 2017-10-16 DIAGNOSIS — Z79899 Other long term (current) drug therapy: Secondary | ICD-10-CM

## 2017-10-16 DIAGNOSIS — E119 Type 2 diabetes mellitus without complications: Secondary | ICD-10-CM | POA: Diagnosis present

## 2017-10-16 DIAGNOSIS — R05 Cough: Secondary | ICD-10-CM

## 2017-10-16 DIAGNOSIS — F329 Major depressive disorder, single episode, unspecified: Secondary | ICD-10-CM

## 2017-10-16 DIAGNOSIS — K5900 Constipation, unspecified: Secondary | ICD-10-CM | POA: Diagnosis present

## 2017-10-16 DIAGNOSIS — F17210 Nicotine dependence, cigarettes, uncomplicated: Secondary | ICD-10-CM | POA: Diagnosis present

## 2017-10-16 DIAGNOSIS — R140 Abdominal distension (gaseous): Secondary | ICD-10-CM

## 2017-10-16 DIAGNOSIS — R059 Cough, unspecified: Secondary | ICD-10-CM

## 2017-10-16 DIAGNOSIS — F32A Depression, unspecified: Secondary | ICD-10-CM

## 2017-10-16 HISTORY — DX: Anemia, unspecified: D64.9

## 2017-10-16 LAB — CBC WITH DIFFERENTIAL/PLATELET
BASOS ABS: 0 10*3/uL (ref 0.0–0.1)
BASOS PCT: 0 %
EOS PCT: 1 %
Eosinophils Absolute: 0 10*3/uL (ref 0.0–0.7)
HCT: 34.1 % — ABNORMAL LOW (ref 36.0–46.0)
Hemoglobin: 11.7 g/dL — ABNORMAL LOW (ref 12.0–15.0)
Lymphocytes Relative: 24 %
Lymphs Abs: 2 10*3/uL (ref 0.7–4.0)
MCH: 29.1 pg (ref 26.0–34.0)
MCHC: 34.3 g/dL (ref 30.0–36.0)
MCV: 84.8 fL (ref 78.0–100.0)
MONOS PCT: 8 %
Monocytes Absolute: 0.7 10*3/uL (ref 0.1–1.0)
Neutro Abs: 5.7 10*3/uL (ref 1.7–7.7)
Neutrophils Relative %: 67 %
PLATELETS: 203 10*3/uL (ref 150–400)
RBC: 4.02 MIL/uL (ref 3.87–5.11)
RDW: 14 % (ref 11.5–15.5)
WBC: 8.5 10*3/uL (ref 4.0–10.5)

## 2017-10-16 LAB — BASIC METABOLIC PANEL
ANION GAP: 9 (ref 5–15)
BUN: 5 mg/dL — ABNORMAL LOW (ref 6–20)
CALCIUM: 8.3 mg/dL — AB (ref 8.9–10.3)
CO2: 27 mmol/L (ref 22–32)
CREATININE: 0.34 mg/dL — AB (ref 0.44–1.00)
Chloride: 77 mmol/L — ABNORMAL LOW (ref 101–111)
GLUCOSE: 114 mg/dL — AB (ref 65–99)
Potassium: 3.9 mmol/L (ref 3.5–5.1)
Sodium: 113 mmol/L — CL (ref 135–145)

## 2017-10-16 LAB — OSMOLALITY, URINE: OSMOLALITY UR: 73 mosm/kg — AB (ref 300–900)

## 2017-10-16 LAB — HEPATIC FUNCTION PANEL
ALT: 16 U/L (ref 14–54)
AST: 13 U/L — AB (ref 15–41)
Albumin: 3.2 g/dL — ABNORMAL LOW (ref 3.5–5.0)
Alkaline Phosphatase: 61 U/L (ref 38–126)
BILIRUBIN DIRECT: 0.1 mg/dL (ref 0.1–0.5)
BILIRUBIN TOTAL: 0.5 mg/dL (ref 0.3–1.2)
Indirect Bilirubin: 0.4 mg/dL (ref 0.3–0.9)
Total Protein: 5.7 g/dL — ABNORMAL LOW (ref 6.5–8.1)

## 2017-10-16 LAB — ECHOCARDIOGRAM COMPLETE

## 2017-10-16 LAB — I-STAT TROPONIN, ED: TROPONIN I, POC: 0.01 ng/mL (ref 0.00–0.08)

## 2017-10-16 LAB — OSMOLALITY: Osmolality: 232 mOsm/kg — CL (ref 275–295)

## 2017-10-16 LAB — BRAIN NATRIURETIC PEPTIDE: B Natriuretic Peptide: 204 pg/mL — ABNORMAL HIGH (ref 0.0–100.0)

## 2017-10-16 LAB — TSH: TSH: 0.793 u[IU]/mL (ref 0.350–4.500)

## 2017-10-16 LAB — VALPROIC ACID LEVEL: Valproic Acid Lvl: 32 ug/mL — ABNORMAL LOW (ref 50.0–100.0)

## 2017-10-16 LAB — LIPASE, BLOOD: LIPASE: 27 U/L (ref 11–51)

## 2017-10-16 LAB — CORTISOL: Cortisol, Plasma: 13.8 ug/dL

## 2017-10-16 LAB — SODIUM, URINE, RANDOM

## 2017-10-16 MED ORDER — CLONIDINE HCL 0.3 MG/24HR TD PTWK
0.3000 mg | MEDICATED_PATCH | TRANSDERMAL | Status: DC
Start: 2017-10-21 — End: 2017-10-19

## 2017-10-16 MED ORDER — ACETAMINOPHEN 325 MG PO TABS
650.0000 mg | ORAL_TABLET | Freq: Four times a day (QID) | ORAL | Status: DC | PRN
  Administered 2017-10-16 – 2017-10-17 (×3): 650 mg via ORAL
  Filled 2017-10-16 (×3): qty 2

## 2017-10-16 MED ORDER — PANTOPRAZOLE SODIUM 40 MG PO TBEC
40.0000 mg | DELAYED_RELEASE_TABLET | Freq: Every day | ORAL | Status: DC
  Administered 2017-10-16 – 2017-10-19 (×4): 40 mg via ORAL
  Filled 2017-10-16 (×4): qty 1

## 2017-10-16 MED ORDER — ADULT MULTIVITAMIN W/MINERALS CH
1.0000 | ORAL_TABLET | Freq: Every day | ORAL | Status: DC
  Administered 2017-10-16 – 2017-10-19 (×4): 1 via ORAL
  Filled 2017-10-16 (×4): qty 1

## 2017-10-16 MED ORDER — ALPRAZOLAM 0.5 MG PO TABS
0.5000 mg | ORAL_TABLET | Freq: Three times a day (TID) | ORAL | Status: DC
  Administered 2017-10-16 – 2017-10-19 (×11): 0.5 mg via ORAL
  Filled 2017-10-16 (×11): qty 1

## 2017-10-16 MED ORDER — PROMETHAZINE HCL 25 MG PO TABS
25.0000 mg | ORAL_TABLET | Freq: Four times a day (QID) | ORAL | Status: DC | PRN
  Administered 2017-10-16 – 2017-10-18 (×2): 25 mg via ORAL
  Filled 2017-10-16 (×2): qty 1

## 2017-10-16 MED ORDER — MELATONIN 5 MG PO TABS
10.0000 mg | ORAL_TABLET | Freq: Every day | ORAL | Status: DC
  Administered 2017-10-16 – 2017-10-18 (×3): 10 mg via ORAL
  Filled 2017-10-16 (×3): qty 2

## 2017-10-16 MED ORDER — MAGNESIUM OXIDE 400 (241.3 MG) MG PO TABS
800.0000 mg | ORAL_TABLET | Freq: Two times a day (BID) | ORAL | Status: DC
  Administered 2017-10-16 – 2017-10-18 (×5): 800 mg via ORAL
  Filled 2017-10-16 (×5): qty 2

## 2017-10-16 MED ORDER — SUCRALFATE 1 G PO TABS
1.0000 g | ORAL_TABLET | Freq: Four times a day (QID) | ORAL | Status: DC
  Administered 2017-10-16 – 2017-10-19 (×14): 1 g via ORAL
  Filled 2017-10-16 (×15): qty 1

## 2017-10-16 MED ORDER — SENNA 8.6 MG PO TABS
1.0000 | ORAL_TABLET | Freq: Every day | ORAL | Status: DC
  Administered 2017-10-16 – 2017-10-19 (×3): 8.6 mg via ORAL
  Filled 2017-10-16 (×3): qty 1

## 2017-10-16 MED ORDER — HYDRALAZINE HCL 50 MG PO TABS
100.0000 mg | ORAL_TABLET | Freq: Three times a day (TID) | ORAL | Status: DC
  Administered 2017-10-16 – 2017-10-19 (×10): 100 mg via ORAL
  Filled 2017-10-16 (×11): qty 2

## 2017-10-16 MED ORDER — ASPIRIN 81 MG PO CHEW
81.0000 mg | CHEWABLE_TABLET | Freq: Every day | ORAL | Status: DC
  Administered 2017-10-16 – 2017-10-19 (×4): 81 mg via ORAL
  Filled 2017-10-16 (×4): qty 1

## 2017-10-16 MED ORDER — FLUTICASONE FUROATE-VILANTEROL 100-25 MCG/INH IN AEPB
1.0000 | INHALATION_SPRAY | Freq: Every day | RESPIRATORY_TRACT | Status: DC
  Administered 2017-10-17 – 2017-10-19 (×3): 1 via RESPIRATORY_TRACT
  Filled 2017-10-16: qty 28

## 2017-10-16 MED ORDER — BUPROPION HCL ER (XL) 150 MG PO TB24
150.0000 mg | ORAL_TABLET | Freq: Every day | ORAL | Status: DC
  Administered 2017-10-16 – 2017-10-18 (×3): 150 mg via ORAL
  Filled 2017-10-16 (×3): qty 1

## 2017-10-16 MED ORDER — PAROXETINE HCL 20 MG PO TABS
20.0000 mg | ORAL_TABLET | Freq: Every day | ORAL | Status: DC
  Administered 2017-10-16 – 2017-10-19 (×4): 20 mg via ORAL
  Filled 2017-10-16 (×4): qty 1

## 2017-10-16 MED ORDER — ALBUTEROL SULFATE (2.5 MG/3ML) 0.083% IN NEBU
5.0000 mg | INHALATION_SOLUTION | Freq: Once | RESPIRATORY_TRACT | Status: AC
  Administered 2017-10-16: 5 mg via RESPIRATORY_TRACT
  Filled 2017-10-16: qty 6

## 2017-10-16 MED ORDER — MAGNESIUM HYDROXIDE 400 MG/5ML PO SUSP: 30.0000 mL | Freq: Every evening | ORAL | Status: DC | PRN

## 2017-10-16 MED ORDER — NIFEDIPINE ER 60 MG PO TB24
90.0000 mg | ORAL_TABLET | Freq: Every day | ORAL | Status: DC
  Administered 2017-10-16 – 2017-10-19 (×4): 90 mg via ORAL
  Filled 2017-10-16 (×7): qty 1

## 2017-10-16 MED ORDER — POLYETHYLENE GLYCOL 3350 17 G PO PACK
17.0000 g | PACK | Freq: Every day | ORAL | Status: DC
  Administered 2017-10-16 – 2017-10-17 (×2): 17 g via ORAL
  Filled 2017-10-16 (×2): qty 1

## 2017-10-16 MED ORDER — ALBUTEROL SULFATE (2.5 MG/3ML) 0.083% IN NEBU
2.5000 mg | INHALATION_SOLUTION | Freq: Four times a day (QID) | RESPIRATORY_TRACT | Status: DC
  Administered 2017-10-16 (×2): 2.5 mg via RESPIRATORY_TRACT
  Filled 2017-10-16: qty 3

## 2017-10-16 MED ORDER — SODIUM CHLORIDE 0.9 % IV SOLN
INTRAVENOUS | Status: DC
  Administered 2017-10-16 – 2017-10-17 (×2): via INTRAVENOUS

## 2017-10-16 MED ORDER — ACETAMINOPHEN 650 MG RE SUPP: 650.0000 mg | Freq: Four times a day (QID) | RECTAL | Status: DC | PRN

## 2017-10-16 MED ORDER — SODIUM CHLORIDE 1 G PO TABS
1.0000 g | ORAL_TABLET | Freq: Three times a day (TID) | ORAL | Status: DC
  Administered 2017-10-16 – 2017-10-19 (×10): 1 g via ORAL
  Filled 2017-10-16 (×11): qty 1

## 2017-10-16 MED ORDER — FUROSEMIDE 40 MG PO TABS
40.0000 mg | ORAL_TABLET | Freq: Every day | ORAL | Status: DC
  Administered 2017-10-16: 40 mg via ORAL
  Filled 2017-10-16: qty 1

## 2017-10-16 MED ORDER — LURASIDONE HCL 40 MG PO TABS
80.0000 mg | ORAL_TABLET | Freq: Every day | ORAL | Status: DC
  Administered 2017-10-16 – 2017-10-19 (×4): 80 mg via ORAL
  Filled 2017-10-16 (×4): qty 2

## 2017-10-16 MED ORDER — LOPERAMIDE HCL 2 MG PO CAPS: 2.0000 mg | ORAL_CAPSULE | ORAL | Status: DC | PRN

## 2017-10-16 MED ORDER — PRAVASTATIN SODIUM 20 MG PO TABS
20.0000 mg | ORAL_TABLET | Freq: Every day | ORAL | Status: DC
  Administered 2017-10-16 – 2017-10-19 (×4): 20 mg via ORAL
  Filled 2017-10-16 (×4): qty 1

## 2017-10-16 MED ORDER — CARVEDILOL 25 MG PO TABS
25.0000 mg | ORAL_TABLET | Freq: Two times a day (BID) | ORAL | Status: DC
  Administered 2017-10-16 – 2017-10-19 (×7): 25 mg via ORAL
  Filled 2017-10-16 (×8): qty 1

## 2017-10-16 MED ORDER — ENOXAPARIN SODIUM 40 MG/0.4ML ~~LOC~~ SOLN
40.0000 mg | SUBCUTANEOUS | Status: DC
  Administered 2017-10-16 – 2017-10-17 (×2): 40 mg via SUBCUTANEOUS
  Filled 2017-10-16 (×2): qty 0.4

## 2017-10-16 MED ORDER — FERROUS SULFATE 325 (65 FE) MG PO TABS
325.0000 mg | ORAL_TABLET | Freq: Every day | ORAL | Status: DC
  Administered 2017-10-16 – 2017-10-19 (×4): 325 mg via ORAL
  Filled 2017-10-16 (×4): qty 1

## 2017-10-16 MED ORDER — FLUTICASONE PROPIONATE 50 MCG/ACT NA SUSP
1.0000 | Freq: Two times a day (BID) | NASAL | Status: DC
  Administered 2017-10-16 – 2017-10-19 (×6): 1 via NASAL
  Filled 2017-10-16: qty 16

## 2017-10-16 MED ORDER — ALBUTEROL SULFATE (2.5 MG/3ML) 0.083% IN NEBU
2.5000 mg | INHALATION_SOLUTION | Freq: Four times a day (QID) | RESPIRATORY_TRACT | Status: DC | PRN
  Filled 2017-10-16: qty 3

## 2017-10-16 MED ORDER — DIVALPROEX SODIUM 250 MG PO DR TAB
250.0000 mg | DELAYED_RELEASE_TABLET | Freq: Two times a day (BID) | ORAL | Status: DC
  Administered 2017-10-16 – 2017-10-19 (×7): 250 mg via ORAL
  Filled 2017-10-16 (×8): qty 1

## 2017-10-16 MED ORDER — ALBUTEROL SULFATE (2.5 MG/3ML) 0.083% IN NEBU
2.5000 mg | INHALATION_SOLUTION | Freq: Three times a day (TID) | RESPIRATORY_TRACT | Status: DC
  Administered 2017-10-16 – 2017-10-18 (×5): 2.5 mg via RESPIRATORY_TRACT
  Filled 2017-10-16 (×5): qty 3

## 2017-10-16 NOTE — Progress Notes (Signed)
  Echocardiogram 2D Echocardiogram has been performed.  Quynn Vilchis T Sya Nestler 10/16/2017, 3:19 PM

## 2017-10-16 NOTE — ED Provider Notes (Signed)
[facility] DEPT Provider Note   CSN: [id number] Arrival date & time: 10/16/17  0326     History   Chief Complaint Chief Complaint  Patient presents with  . Shortness of Breath  . Abdominal Pain    HPI Natalie Thomas is a 57 y.o. female.  The history is provided by the patient and medical records.  Shortness of Breath  Associated symptoms include cough, vomiting and abdominal pain.  Abdominal Pain   Associated symptoms include nausea, vomiting and constipation.     57 year old female with history of congestive heart failure, COPD, depression, diabetes, GERD, hypertension, osteoporosis, schizoaffective disorder, urinary incontinence, presenting to the ED with multiple complaints.  States for the past 3 days she has been having a lot of epigastric abdominal pain as well as some trouble breathing.  She does report productive cough with thick mucus, unsure of color.  Denies any hemoptysis.  No reported fever or chills.  States she has had a little bit of nausea and vomiting.  She cannot specify if this is associated with bouts of coughing or not.  States she has not had a bowel movement in a few days, last BM was hard and smaller in caliber than normal.  She was given breathing treatments on the way to the ED as well as IV Solu-Medrol without significant change in her symptoms.  She is chronically oxygen dependent, 2 L at baseline.  Past Medical History:  Diagnosis Date  . CHF (congestive heart failure) ([place])   . COPD (chronic obstructive pulmonary disease) ([place])   . Depression   . Diabetes mellitus, type 2 ([place])    pt denies but states that she has been treated for DM  . GERD (gastroesophageal reflux disease)   . Hypertension   . Osteoporosis     Patient Active Problem List   Diagnosis Date Noted  . COPD exacerbation ([place]) 10/07/2017  . Hyperlipidemia 10/07/2017  . Chronic respiratory failure with hypoxia ([place]) 10/07/2017  . Acute on  chronic respiratory failure with hypoxia and hypercapnia ([place]) 10/07/2017  . Incontinence of urine 08/14/2011  . Hyponatremia 07/27/2011  . Schizoaffective disorder, bipolar type ([place]) 07/05/2011  . Cough 05/08/2011  . Encounter for long-term (current) use of other medications 04/09/2011  . Lumbar disc disease 04/09/2011  . Polycythemia secondary to smoking 04/09/2011  . HIP PAIN, LEFT 12/12/2007  . 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 DIABETES MELLITUS, TYPE II 04/08/2007  . MENOPAUSE, PREMATURE 04/08/2007  . Chronic hyponatremia 04/08/2007  . SCHIZOAFFECTIVE DISORDER 04/08/2007  . DEPRESSION 04/08/2007  . Essential hypertension 04/08/2007  . COPD 04/08/2007  . GERD 04/08/2007  . OSTEOPOROSIS 04/08/2007  . DYSURIA 04/08/2007    Past Surgical History:  Procedure Laterality Date  . EYE SURGERY     on left  eye, pt states that she sees bad out of the right eye and needs surgery there  . PELVIC FRACTURE SURGERY       OB History   None      Home Medications    Prior to Admission medications   Medication Sig Start Date End Date Taking? Authorizing Provider  acetaminophen (TYLENOL) 325 MG tablet Take 650 mg by mouth every 6 (six) hours as needed for mild pain.    [provider]  albuterol (PROVENTIL HFA;VENTOLIN HFA) 108 (90 Base) MCG/ACT inhaler Inhale 2 puffs into the lungs every 4 (four) hours as needed for wheezing or shortness of breath. 07/05/17   Ward, Delice Bison, DO  ALPRAZolam (XANAX) 0.5 MG tablet Take 1 tablet (0.5 mg total) by mouth 3 (three) times daily. 08/12/17   Nita Sells, MD  alum & mag hydroxide-simeth ([place]) 200-200-20 MG/5ML suspension Take 30 mLs by mouth every 6 (six) hours as needed for indigestion or heartburn.    [provider]  aspirin 81 MG tablet Take 1 tablet (81 mg total) by mouth daily. For heart health 08/06/11   Greig Castilla, FNP  buPROPion (WELLBUTRIN XL) 150 MG 24 hr tablet Take 1 tablet (150 mg total) by  mouth daily. Patient not taking: Reported on 10/06/2017 08/13/17   Nita Sells, MD  carvedilol (COREG) 25 MG tablet Take 25 mg by mouth 2 (two) times daily with a meal.    [provider]  cloNIDine (CATAPRES - DOSED IN MG/24 HR) 0.3 mg/24hr patch Place 0.3 mg onto the skin once a week.    [provider]  dextromethorphan-guaiFENesin (MUCINEX DM) 30-600 MG 12hr tablet Take 1 tablet by mouth every 12 (twelve) hours as needed for cough.    [provider]  divalproex (DEPAKOTE) 250 MG DR tablet Take 1 tablet (250 mg total) by mouth every 12 (twelve) hours. 08/12/17   Nita Sells, MD  doxycycline (VIBRA-TABS) 100 MG tablet Take 1 tablet (100 mg total) by mouth every 12 (twelve) hours. 10/08/17   Charlynne Cousins, MD  ferrous sulfate 325 (65 FE) MG tablet Take 325 mg by mouth daily with breakfast.    [provider]  fluticasone (FLONASE) 50 MCG/ACT nasal spray Place 2 sprays into both nostrils 2 (two) times daily.    [provider]  fluticasone furoate-vilanterol (BREO ELLIPTA) 100-25 MCG/INH AEPB Inhale 1 puff into the lungs daily.    [provider]  furosemide (LASIX) 40 MG tablet Take 1 tablet (40 mg total) by mouth daily. 08/13/17   Nita Sells, MD  guaifenesin (ROBITUSSIN) 100 MG/5ML syrup Take 200 mg by mouth 3 (three) times daily as needed for cough.    [provider]  hydrALAZINE (APRESOLINE) 100 MG tablet Take 100 mg by mouth every 8 (eight) hours.    [provider]  ipratropium-albuterol (DUONEB) 0.5-2.5 (3) MG/3ML SOLN Take 3 mLs by nebulization 3 (three) times daily as needed (SOb).    [provider]  loperamide (IMODIUM) 2 MG capsule Take 2 mg by mouth as needed for diarrhea or loose stools.    [provider]  lurasidone (LATUDA) 80 MG TABS tablet Take 1 tablet (80 mg total) by mouth daily with breakfast. 08/12/17   Nita Sells, MD  magnesium hydroxide (MILK OF  MAGNESIA) 400 MG/5ML suspension Take 30 mLs by mouth at bedtime as needed for mild constipation.    [provider]  magnesium oxide (MAG-OX) 400 (241.3 Mg) MG tablet Take 2 tablets (800 mg total) by mouth 2 (two) times daily. 08/12/17   Nita Sells, MD  Melatonin 10 MG TABS Take 10 mg by mouth at bedtime.    [provider]  Multiple Vitamin (DAILY-VITE PO) Take 1 tablet by mouth daily.    [provider]  neomycin-bacitracin-polymyxin (NEOSPORIN) OINT Apply 1 application topically daily as needed for wound care.    [provider]  NIFEdipine (PROCARDIA XL/ADALAT-CC) 90 MG 24 hr tablet Take 90 mg by mouth daily.    [provider]  omeprazole (PRILOSEC) 20 MG capsule Take 1 capsule (20 mg total) by mouth daily. For acid reflux. Patient taking differently: Take 20 mg by mouth 2 (two)  times daily before a meal. For acid reflux. 08/06/11   Greig Castilla, FNP  oxyCODONE (OXY IR/ROXICODONE) 5 MG immediate release tablet Take 5 mg by mouth every 6 (six) hours as needed for severe pain.    [provider]  oxyCODONE-acetaminophen (PERCOCET/ROXICET) 5-325 MG tablet Take 1 tablet by mouth every 4 (four) hours as needed for moderate pain. Patient not taking: Reported on 10/06/2017 08/12/17   Nita Sells, MD  PARoxetine (PAXIL) 20 MG tablet Take 2 tablets (40 mg total) by mouth daily. Patient taking differently: Take 20 mg by mouth daily.  08/12/17   Nita Sells, MD  polyethylene glycol [facility]) packet Take 17 g by mouth daily. Patient not taking: Reported on 10/06/2017 07/17/17   Horton, Barbette Hair, MD  pravastatin (PRAVACHOL) 20 MG tablet Take 20 mg by mouth daily.    [provider]  predniSONE (DELTASONE) 10 MG tablet Take 10 mg by mouth daily with breakfast.    [provider]  predniSONE (DELTASONE) 10 MG tablet Takes 6 tablets for 1 days, then 5 tablets for 1 days, then 4 tablets for 1 days, then 3 tablets  for 1 days, then 2 tabs for 1 days, then 1 tab for 1 days, and then stop. 10/08/17   Charlynne Cousins, MD  promethazine (PHENERGAN) 25 MG tablet Take 25 mg by mouth every 6 (six) hours as needed for nausea or vomiting.    [provider]  senna (SENOKOT) 8.6 MG TABS tablet Take 1 tablet (8.6 mg total) by mouth daily. Patient not taking: Reported on 10/06/2017 08/12/17   Nita Sells, MD  sodium chloride 1 g tablet Take 1 g by mouth 3 (three) times daily with meals.     [provider]  sucralfate (CARAFATE) 1 g tablet Take 1 g by mouth 4 (four) times daily.    [provider]    Family History No family history on file.  Social History Social History   Tobacco Use  . Smoking status: Current Every Day Smoker    Packs/day: 0.25    Types: Cigarettes  . Smokeless tobacco: Never Used  Substance Use Topics  . Alcohol use: No  . Drug use: No     Allergies   Clarithromycin and Penicillins   Review of Systems Review of Systems  Respiratory: Positive for cough and shortness of breath.   Gastrointestinal: Positive for abdominal pain, constipation, nausea and vomiting.  All other systems reviewed and are negative.    Physical Exam Updated Vital Signs BP (!) 148/67   Pulse (!) 57   Temp 97.6 F (36.4 C) (Oral)   Resp (!) 22   SpO2 99%   Physical Exam  Constitutional: She appears well-developed and well-nourished.  HENT:  Head: Normocephalic and atraumatic.  Mouth/Throat: Oropharynx is clear and moist.  Eyes: Pupils are equal, round, and reactive to light. Conjunctivae and EOM are normal.  Neck: Normal range of motion.  Cardiovascular: Normal rate, regular rhythm and normal heart sounds.  Pulmonary/Chest: Effort normal. She has wheezes. She has no rales.  Expiratory wheezes noted, no acute distress, able to speak in sentences, O2 sats 95% on baseline 2L  Abdominal: Soft. Bowel sounds are normal. There is tenderness (mild) in the epigastric  area.  Musculoskeletal: Normal range of motion.  Neurological: She is alert.  Awake, seems a bit drowsy but able to answer questions and follow commands without issue, moving all extremities well  Skin: Skin is warm and dry.  Psychiatric: She  has a normal mood and affect.  Nursing note and vitals reviewed.    ED Treatments / Results  Labs (all labs ordered are listed, but only abnormal results are displayed) Labs Reviewed  CBC WITH DIFFERENTIAL/PLATELET - Abnormal; Notable for the following components:      Result Value   Hemoglobin 11.7 (*)    HCT 34.1 (*)    All other components within normal limits  BASIC METABOLIC PANEL - Abnormal; Notable for the following components:   Sodium 113 (*)    Chloride 77 (*)    Glucose, Bld 114 (*)    BUN 5 (*)    Creatinine, Ser 0.34 (*)    Calcium 8.3 (*)    All other components within normal limits  HEPATIC FUNCTION PANEL - Abnormal; Notable for the following components:   Total Protein 5.7 (*)    Albumin 3.2 (*)    AST 13 (*)    All other components within normal limits  BRAIN NATRIURETIC PEPTIDE - Abnormal; Notable for the following components:   B Natriuretic Peptide 204.0 (*)    All other components within normal limits  VALPROIC ACID LEVEL - Abnormal; Notable for the following components:   Valproic Acid Lvl 32 (*)    All other components within normal limits  LIPASE, BLOOD  TSH  OSMOLALITY  CORTISOL  OSMOLALITY, URINE  SODIUM, URINE, RANDOM  I-STAT TROPONIN, ED    EKG None  Radiology Dg Abd Acute W/chest  Result Date: 10/16/2017 CLINICAL DATA:  Constipation for 1 week, shortness of breath. EXAM: DG ABDOMEN ACUTE W/ 1V CHEST COMPARISON:  Chest radiograph October 06, 2017 and CT abdomen and pelvis October 06, 2017. FINDINGS: Cardiac silhouette is mildly enlarged. Mild vascular congestion. Mediastinal silhouette is nonsuspicious, calcified aortic knob. Lungs are clear, no pleural effusions. No pneumothorax. Soft tissue planes  and included osseous structures are unremarkable. Bowel gas pattern is nondilated and nonobstructive. A few scattered air-fluid levels. No intra-abdominal mass effect, pathologic calcifications or free air. Severe calcific atherosclerosis. Surgical clips in the included right abdomen compatible with cholecystectomy. Soft tissue planes and included osseous structures are non-suspicious. Multiple spinal fractures. Old RIGHT pelvic fractures. IMPRESSION: Mild cardiomegaly and mild vascular congestion. Scattered air-fluid levels seen with enteritis. Aortic Atherosclerosis (ICD10-I70.0). Electronically Signed   By: Elon Alas M.D.   On: 10/16/2017 04:45    Procedures Procedures (including critical care time)  CRITICAL CARE Performed by: Larene Pickett   Total critical care time: 40 minutes  Critical care time was exclusive of separately billable procedures and treating other patients.  Critical care was necessary to treat or prevent imminent or life-threatening deterioration.  Critical care was time spent personally by me on the following activities: development of treatment plan with patient and/or surrogate as well as nursing, discussions with consultants, evaluation of patient's response to treatment, examination of patient, obtaining history from patient or surrogate, ordering and performing treatments and interventions, ordering and review of laboratory studies, ordering and review of radiographic studies, pulse oximetry and re-evaluation of patient's condition.   Medications Ordered in ED Medications  albuterol (PROVENTIL) (2.5 MG/3ML) 0.083% nebulizer solution 5 mg (5 mg Nebulization Given 10/16/17 0408)     Initial Impression / Assessment and Plan / ED Course  I have reviewed the triage vital signs and the nursing notes.  Pertinent labs & imaging results that were available during my care of the patient were reviewed by me and considered in my medical decision making (see chart  for  details).  57 y.o. F here with SOB and abdominal pain x 3 days.  Reports associated productive cough and a few episodes of emesis.  She is afebrile, non-toxic.  Does have some expiratory wheezes on exam.  She is able to speak in full sentences.  O2 sats 95% on her baseline 2L.  Very mild tenderness in the epigastrium.  No rebound or guarding.  Labs and acute abd series pending.  Was already given nebs and solu-medrol with EMS.  Additional albuterol ordered given continued wheezing.  Labs with critical sodium of 113.  No tremors or seizure activity here.  BNP 204.  Does not appear significantly fluid overloaded.  Acute abd series with some mild vascular congestion, no frank edema.  Has abdominal air fluid levels seen with enteritis.  On chart review, patient has history of hyponatremia, last incidence in February 2019, responded well to interventions during admission.  She will require admission again today for continued close monitoring to ensure this corrects.  Discussed with Dr. Maudie Mercury-- will admit for ongoing care.  Final Clinical Impressions(s) / ED Diagnoses   Final diagnoses:  Hyponatremia  Shortness of breath  Cough    ED Discharge Orders    None       Larene Pickett, PA-C 10/16/17 3716    Varney Biles, MD 10/17/17 [phone]

## 2017-10-16 NOTE — ED Notes (Signed)
Pt placed on a purewick catheter.

## 2017-10-16 NOTE — ED Notes (Signed)
Bed: NU27 Expected date:  Expected time:  Means of arrival:  Comments: EMS 57 yo female from SNF-SOB/abdominal pain/COPD/wheezing neb 162/94 HR 60

## 2017-10-16 NOTE — ED Notes (Signed)
ED Provider at bedside. 

## 2017-10-16 NOTE — H&P (Signed)
TRH H&P   Patient Demographics:    Natalie Thomas, is a 57 y.o. female  MRN: [id number]   DOB - Sep 10, 1960  Admit Date - 10/16/2017  Outpatient Primary MD for the patient is Renato Shin, MD  Referring MD/NP/PA:  Dr. Kathrynn Humble  Outpatient Specialists:    Patient coming from:  home  Chief Complaint  Patient presents with  . Shortness of Breath  . Abdominal Pain      HPI:    Natalie Thomas  is a 57 y.o. female, w hypertension, Dm2, CHF (EF ?), Copd on 2L [place], gerd, apparently c/o dyspnea and slight cough.  Pt apparently told ER that she has constipation. And abdominal pain.  Pt is not a very good historian and unable to tell me if she has gained weight or anything further.    In ED,  Abd xray IMPRESSION: Mild cardiomegaly and mild vascular congestion.  Scattered air-fluid levels seen with enteritis.  Aortic Atherosclerosis (ICD10-I70.0).   Wbc 8.5, Hgb 11.7, Plt 203  Na 113 (prior 131) Bun 5, Creatinine 0.34 Alb 3.2 Ast 13, Alt 16 Lipase 27 BNP 204.0 Valproic acid 32  Tsh 0.793  Pt will be admitted for enteritis, constipation, and hyponatremia, and dyspnea.         Review of systems:    In addition to the HPI above,    No Fever-chills, No Headache, No changes with Vision or hearing, No problems swallowing food or Liquids, No Chest pain, Cough or Shortness of Breath, No Blood in stool or Urine, No dysuria, No new skin rashes or bruises, No new joints pains-aches,  No new weakness, tingling, numbness in any extremity, No recent weight gain or loss, No polyuria, polydypsia or polyphagia, No significant Mental Stressors.  A full 10 point Review of Systems was done, except as stated above, all other Review of Systems were negative.   With Past History of the following :    Past Medical History:  Diagnosis Date  . CHF (congestive heart  failure) ([place])   . COPD (chronic obstructive pulmonary disease) ([place])   . Depression   . Diabetes mellitus, type 2 ([place])    pt denies but states that she has been treated for DM  . GERD (gastroesophageal reflux disease)   . Hypertension   . Osteoporosis       Past Surgical History:  Procedure Laterality Date  . EYE SURGERY     on left  eye, pt states that she sees bad out of the right eye and needs surgery there  . PELVIC FRACTURE SURGERY        Social History:     Social History   Tobacco Use  . Smoking status: Current Every Day Smoker    Packs/day: 0.25    Types: Cigarettes  . Smokeless tobacco: Never Used  Substance Use Topics  . Alcohol use: No  Lives - at home  Mobility - walks by self   Family History :    No family history on file. Unable to obtain due to somnolence.    Home Medications:   Prior to Admission medications   Medication Sig Start Date End Date Taking? Authorizing Provider  acetaminophen (TYLENOL) 325 MG tablet Take 650 mg by mouth every 6 (six) hours as needed for mild pain.   Yes [provider]  albuterol (PROVENTIL HFA;VENTOLIN HFA) 108 (90 Base) MCG/ACT inhaler Inhale 2 puffs into the lungs every 4 (four) hours as needed for wheezing or shortness of breath. 07/05/17  Yes Ward, Delice Bison, DO  ALPRAZolam (XANAX) 0.5 MG tablet Take 1 tablet (0.5 mg total) by mouth 3 (three) times daily. 08/12/17  Yes Nita Sells, MD  alum & mag hydroxide-simeth ([place]) 200-200-20 MG/5ML suspension Take 30 mLs by mouth every 6 (six) hours as needed for indigestion or heartburn.   Yes [provider]  aspirin 81 MG tablet Take 1 tablet (81 mg total) by mouth daily. For heart health 08/06/11  Yes Greig Castilla, FNP  buPROPion (WELLBUTRIN XL) 150 MG 24 hr tablet Take 1 tablet (150 mg total) by mouth daily. 08/13/17  Yes Nita Sells, MD  carvedilol (COREG) 25 MG tablet Take 25 mg by mouth 2 (two) times daily with a meal.   Yes  [provider]  cloNIDine (CATAPRES - DOSED IN MG/24 HR) 0.3 mg/24hr patch Place 0.3 mg onto the skin once a week.   Yes [provider]  dextromethorphan-guaiFENesin (MUCINEX DM) 30-600 MG 12hr tablet Take 1 tablet by mouth every 12 (twelve) hours as needed for cough.   Yes [provider]  divalproex (DEPAKOTE) 250 MG DR tablet Take 1 tablet (250 mg total) by mouth every 12 (twelve) hours. 08/12/17  Yes Nita Sells, MD  ferrous sulfate 325 (65 FE) MG tablet Take 325 mg by mouth daily with breakfast.   Yes [provider]  fluticasone (FLONASE) 50 MCG/ACT nasal spray Place 2 sprays into both nostrils 2 (two) times daily.   Yes [provider]  fluticasone furoate-vilanterol (BREO ELLIPTA) 100-25 MCG/INH AEPB Inhale 1 puff into the lungs daily.   Yes [provider]  furosemide (LASIX) 40 MG tablet Take 1 tablet (40 mg total) by mouth daily. 08/13/17  Yes Nita Sells, MD  guaifenesin (ROBITUSSIN) 100 MG/5ML syrup Take 200 mg by mouth 3 (three) times daily as needed for cough.   Yes [provider]  hydrALAZINE (APRESOLINE) 100 MG tablet Take 100 mg by mouth every 8 (eight) hours.   Yes [provider]  ipratropium-albuterol (DUONEB) 0.5-2.5 (3) MG/3ML SOLN Take 3 mLs by nebulization 3 (three) times daily as needed (SOb).   Yes [provider]  loperamide (IMODIUM) 2 MG capsule Take 2 mg by mouth as needed for diarrhea or loose stools.   Yes [provider]  lurasidone (LATUDA) 80 MG TABS tablet Take 1 tablet (80 mg total) by mouth daily with breakfast. 08/12/17  Yes Nita Sells, MD  magnesium hydroxide (MILK OF MAGNESIA) 400 MG/5ML suspension Take 30 mLs by mouth at bedtime as needed for mild constipation.   Yes [provider]  magnesium oxide (MAG-OX) 400 (241.3 Mg) MG tablet Take 2 tablets (800 mg total) by mouth 2 (two) times daily. 08/12/17  Yes Nita Sells, MD    Melatonin 10 MG TABS Take 10 mg by mouth at bedtime.   Yes [provider]  Multiple Vitamin (  DAILY-VITE PO) Take 1 tablet by mouth daily.   Yes [provider]  neomycin-bacitracin-polymyxin (NEOSPORIN) OINT Apply 1 application topically daily as needed for wound care.   Yes [provider]  NIFEdipine (PROCARDIA XL/ADALAT-CC) 90 MG 24 hr tablet Take 90 mg by mouth daily.   Yes [provider]  omeprazole (PRILOSEC) 20 MG capsule Take 1 capsule (20 mg total) by mouth daily. For acid reflux. Patient taking differently: Take 20 mg by mouth 2 (two) times daily before a meal. For acid reflux. 08/06/11  Yes Greig Castilla, FNP  oxyCODONE (OXY IR/ROXICODONE) 5 MG immediate release tablet Take 5 mg by mouth every 6 (six) hours as needed for severe pain.   Yes [provider]  PARoxetine (PAXIL) 20 MG tablet Take 2 tablets (40 mg total) by mouth daily. Patient taking differently: Take 20 mg by mouth daily.  08/12/17  Yes Nita Sells, MD  polyethylene glycol (MIRALAX) packet Take 17 g by mouth daily. 07/17/17  Yes Horton, Barbette Hair, MD  pravastatin (PRAVACHOL) 20 MG tablet Take 20 mg by mouth daily.   Yes [provider]  promethazine (PHENERGAN) 25 MG tablet Take 25 mg by mouth every 6 (six) hours as needed for nausea or vomiting.   Yes [provider]  senna (SENOKOT) 8.6 MG TABS tablet Take 1 tablet (8.6 mg total) by mouth daily. 08/12/17  Yes Nita Sells, MD  sodium chloride 1 g tablet Take 1 g by mouth 3 (three) times daily with meals.    Yes [provider]  sucralfate (CARAFATE) 1 g tablet Take 1 g by mouth 4 (four) times daily.   Yes [provider]  doxycycline (VIBRA-TABS) 100 MG tablet Take 1 tablet (100 mg total) by mouth every 12 (twelve) hours. Patient not taking: Reported on 10/16/2017 10/08/17   Charlynne Cousins, MD  oxyCODONE-acetaminophen (PERCOCET/ROXICET) 5-325 MG tablet Take 1 tablet  by mouth every 4 (four) hours as needed for moderate pain. Patient not taking: Reported on 10/06/2017 08/12/17   Nita Sells, MD  predniSONE (DELTASONE) 10 MG tablet Takes 6 tablets for 1 days, then 5 tablets for 1 days, then 4 tablets for 1 days, then 3 tablets for 1 days, then 2 tabs for 1 days, then 1 tab for 1 days, and then stop. Patient not taking: Reported on 10/16/2017 10/08/17   Charlynne Cousins, MD     Allergies:     Allergies  Allergen Reactions  . Clarithromycin Itching  . Penicillins Itching    Has patient had a PCN reaction causing immediate rash, facial/tongue/throat swelling, SOB or lightheadedness with hypotension: No Has patient had a PCN reaction causing severe rash involving mucus membranes or skin necrosis: No Has patient had a PCN reaction that required hospitalization: Unknown Has patient had a PCN reaction occurring within the last 10 years: No If all of the above answers are "NO", then may proceed with Cephalosporin use.      Physical Exam:   Vitals  Blood pressure (!) 141/106, pulse 62, temperature 97.6 F (36.4 C), temperature source Oral, resp. rate 17, SpO2 94 %.   1. General  lying in bed in NAD,    2. Normal affect and insight, Not Suicidal or Homicidal, Awake Alert, Oriented X 3.  3. No F.N deficits, ALL C.Nerves Intact, Strength 5/5 all 4 extremities, Sensation intact all 4 extremities, Plantars down going.  4. Ears and Eyes appear Normal, Conjunctivae clear, PERRLA. Moist Oral Mucosa.  5. Supple Neck,  No JVD, No cervical lymphadenopathy appriciated, No Carotid Bruits.  6. Symmetrical Chest wall movement, Good air movement bilaterally, slight bilateral exp wheezing, no crackles.    7. RRR, No Gallops, Rubs or Murmurs, No Parasternal Heave.  8. Positive Bowel Sounds, Abdomen Soft, No tenderness, No organomegaly appriciated,No rebound -guarding or rigidity.  9.  No Cyanosis, Normal Skin Turgor, No Skin Rash or Bruise.  10. Good  muscle tone,  joints appear normal , no effusions, Normal ROM.  11. No Palpable Lymph Nodes in Neck or Axillae     Data Review:    CBC Recent Labs  Lab 10/16/17 0355  WBC 8.5  HGB 11.7*  HCT 34.1*  PLT 203  MCV 84.8  MCH 29.1  MCHC 34.3  RDW 14.0  LYMPHSABS 2.0  MONOABS 0.7  EOSABS 0.0  BASOSABS 0.0   ------------------------------------------------------------------------------------------------------------------  Chemistries  Recent Labs  Lab 10/16/17 0355  NA 113*  K 3.9  CL 77*  CO2 27  GLUCOSE 114*  BUN 5*  CREATININE 0.34*  CALCIUM 8.3*  AST 13*  ALT 16  ALKPHOS 61  BILITOT 0.5   ------------------------------------------------------------------------------------------------------------------ estimated creatinine clearance is 84.5 mL/min (A) (by C-G formula based on SCr of 0.34 mg/dL (L)). ------------------------------------------------------------------------------------------------------------------ Recent Labs    10/16/17 0355  TSH 0.793    Coagulation profile No results for input(s): INR, PROTIME in the last 168 hours. ------------------------------------------------------------------------------------------------------------------- No results for input(s): DDIMER in the last 72 hours. -------------------------------------------------------------------------------------------------------------------  Cardiac Enzymes No results for input(s): CKMB, TROPONINI, MYOGLOBIN in the last 168 hours.  Invalid input(s): CK ------------------------------------------------------------------------------------------------------------------    Component Value Date/Time   BNP 204.0 (H) 10/16/2017 0355     ---------------------------------------------------------------------------------------------------------------  Urinalysis    Component Value Date/Time   COLORURINE STRAW (A) 10/07/2017 0036   APPEARANCEUR CLEAR 10/07/2017 0036   APPEARANCEUR  Clear 06/22/2012 0618   LABSPEC 1.008 10/07/2017 0036   LABSPEC 1.003 06/22/2012 0618   PHURINE 9.0 (H) 10/07/2017 0036   GLUCOSEU NEGATIVE 10/07/2017 0036   GLUCOSEU Negative 06/22/2012 0618   GLUCOSEU NEGATIVE 04/09/2011 1058   HGBUR NEGATIVE 10/07/2017 0036   BILIRUBINUR NEGATIVE 10/07/2017 0036   BILIRUBINUR Negative 06/22/2012 0618   KETONESUR NEGATIVE 10/07/2017 0036   PROTEINUR NEGATIVE 10/07/2017 0036   UROBILINOGEN 1.0 08/18/2011 1805   NITRITE NEGATIVE 10/07/2017 0036   LEUKOCYTESUR NEGATIVE 10/07/2017 0036   LEUKOCYTESUR Trace 06/22/2012 0618    ----------------------------------------------------------------------------------------------------------------   Imaging Results:    Dg Abd Acute W/chest  Result Date: 10/16/2017 CLINICAL DATA:  Constipation for 1 week, shortness of breath. EXAM: DG ABDOMEN ACUTE W/ 1V CHEST COMPARISON:  Chest radiograph October 06, 2017 and CT abdomen and pelvis October 06, 2017. FINDINGS: Cardiac silhouette is mildly enlarged. Mild vascular congestion. Mediastinal silhouette is nonsuspicious, calcified aortic knob. Lungs are clear, no pleural effusions. No pneumothorax. Soft tissue planes and included osseous structures are unremarkable. Bowel gas pattern is nondilated and nonobstructive. A few scattered air-fluid levels. No intra-abdominal mass effect, pathologic calcifications or free air. Severe calcific atherosclerosis. Surgical clips in the included right abdomen compatible with cholecystectomy. Soft tissue planes and included osseous structures are non-suspicious. Multiple spinal fractures. Old RIGHT pelvic fractures. IMPRESSION: Mild cardiomegaly and mild vascular congestion. Scattered air-fluid levels seen with enteritis. Aortic Atherosclerosis (ICD10-I70.0). Electronically Signed   By: Elon Alas M.D.   On: 10/16/2017 04:45       Assessment & Plan:    Principal Problem:   Hyponatremia Active Problems:   Dyspnea    Anemia  Dyspnea secondary to copd exacerbation Solumedrol 80mg  iv q8h levaquin 500mg  iv qday Anoro 1puff qday Albuterol neb q6h and q6h prn  Hyponatremia Check serum osm, cortisol, TSH Check urine sodium, urine osm Hydrate with ns at 60 mL per hour Check cmp in am  Anemia Check cbc in am Consider further w/up with ferritin, iron, tibc, b12, folate  CHF Check cardiac echo Cont cavedilol 25mg  po bid Cont furosemide 40mg  po qday Cont hydralazine 100mg  po tid  Dm2 fsbs ac and qhs, ISS  Mood disorder Cont Latuda    DVT Prophylaxis   Lovenox - SCDs   AM Labs Ordered, also please review Full Orders  Family Communication: Admission, patients condition and plan of care including tests being ordered have been discussed with the patient  who indicate understanding and agree with the plan and Code Status.  Code Status FULL CODE  Likely DC to  home  Condition GUARDED    Consults called: none  Admission status: inpatient   Time spent in minutes : 45   Jani Gravel M.D on 10/16/2017 at 6:40 AM  Between 7am to 7pm - Pager - [phone]  . After 7pm go to [email or web address] - password [facility]  [facility] - Office  [phone]

## 2017-10-16 NOTE — ED Triage Notes (Addendum)
Pt arriving from [facility] complaining of shortness of breath and abdominal pain. Pt reports constipation x3 days, cough, and nausea. Pt reports taking milk of mag to relieve constipation. Lungs diminished in all areas. Hx of COPD, current smoker. Pt arriving receiving last breathing tx. Pt is normally on oxygen via nasal cannula on 2L. 10mg  Albuterol 0.5 Atovent 125mg  Solumedrol

## 2017-10-16 NOTE — ED Notes (Signed)
ED TO INPATIENT HANDOFF REPORT  Name/Age/Gender Franchot Mimes Guynes 57 y.o. female  Code Status Code Status History    Date Active Date Inactive Code Status Order ID Comments User Context   10/07/2017 0030 10/08/2017 2013 Full Code [id number]  Norval Morton, MD ED   07/31/2017 0309 08/12/2017 1840 Full Code [id number]  Damita Lack, MD ED   08/18/2011 2311 08/21/2011 1248 Full Code [id number]  Mackie Pai., MD ED   08/18/2011 1803 08/18/2011 1819 Full Code [id number]  Linus Mako, [place] ED   07/21/2011 0310 07/24/2011 2323 Full Code [id number]  Garald Balding, NP ED   07/02/2011 1258 07/04/2011 1914 Full Code [id number]  Sheliah Mends, PA-C ED   06/16/2011 0606 06/17/2011 2024 Full Code [id number]  Linus Mako, PA ED      Home/SNF/Other Nursing Home  Chief Complaint SOB;abdominal pain  Level of Care/Admitting Diagnosis ED Disposition    ED Disposition Condition [place] [facility] Area: [facility] [[id number]]  Level of Care: Telemetry [5]  Admit to tele based on following criteria: Monitor for Ischemic changes  Diagnosis: Hyponatremia [818403]  Admitting Physician: Jani Gravel [[id number]]  Attending Physician: Jani Gravel [phone]  Estimated length of stay: past midnight tomorrow  Certification:: I certify this patient will need inpatient services for at least 2 midnights  PT Class (Do Not Modify): Inpatient [101]  PT Acc Code (Do Not Modify): Private [1]       Medical History Past Medical History:  Diagnosis Date  . CHF (congestive heart failure) ([place])   . COPD (chronic obstructive pulmonary disease) ([place])   . Depression   . Diabetes mellitus, type 2 ([place])    pt denies but states that she has been treated for DM  . GERD (gastroesophageal reflux disease)   . Hypertension   . Osteoporosis     Allergies Allergies  Allergen Reactions  . Clarithromycin Itching  . Penicillins Itching    Has patient had a PCN reaction causing immediate rash,  facial/tongue/throat swelling, SOB or lightheadedness with hypotension: No Has patient had a PCN reaction causing severe rash involving mucus membranes or skin necrosis: No Has patient had a PCN reaction that required hospitalization: Unknown Has patient had a PCN reaction occurring within the last 10 years: No If all of the above answers are "NO", then may proceed with Cephalosporin use.     IV Location/Drains/Wounds Patient Lines/Drains/Airways Status   Active Line/Drains/Airways    Name:   Placement date:   Placement time:   Site:   Days:   Peripheral IV 10/16/17 Left Hand   10/16/17    -    Hand   less than 1   External Urinary Catheter   10/07/17    0200    -   9          Labs/Imaging Results for orders placed or performed during the hospital encounter of 10/16/17 (from the past 48 hour(s))  CBC with Differential     Status: Abnormal   Collection Time: 10/16/17  3:55 AM  Result Value Ref Range   WBC 8.5 4.0 - 10.5 K/uL   RBC 4.02 3.87 - 5.11 MIL/uL   Hemoglobin 11.7 (L) 12.0 - 15.0 g/dL   HCT 34.1 (L) 36.0 - 46.0 %   MCV 84.8 78.0 - 100.0 fL   MCH 29.1 26.0 - 34.0 pg   MCHC 34.3 30.0 - 36.0 g/dL   RDW 14.0 11.5 - 15.5 %  Platelets 203 150 - 400 K/uL   Neutrophils Relative % 67 %   Neutro Abs 5.7 1.7 - 7.7 K/uL   Lymphocytes Relative 24 %   Lymphs Abs 2.0 0.7 - 4.0 K/uL   Monocytes Relative 8 %   Monocytes Absolute 0.7 0.1 - 1.0 K/uL   Eosinophils Relative 1 %   Eosinophils Absolute 0.0 0.0 - 0.7 K/uL   Basophils Relative 0 %   Basophils Absolute 0.0 0.0 - 0.1 K/uL    Comment: Performed at [facility], [place] [address] [place], [place] [address]  Basic metabolic panel     Status: Abnormal   Collection Time: 10/16/17  3:55 AM  Result Value Ref Range   Sodium 113 (LL) 135 - 145 mmol/L    Comment: CRITICAL RESULT CALLED TO, READ BACK BY AND VERIFIED WITH: T CARTER,RN @0448  10/16/17 MKELLY    Potassium 3.9 3.5 - 5.1 mmol/L   Chloride 77 (L) 101  - 111 mmol/L   CO2 27 22 - 32 mmol/L   Glucose, Bld 114 (H) 65 - 99 mg/dL   BUN 5 (L) 6 - 20 mg/dL   Creatinine, Ser 0.34 (L) 0.44 - 1.00 mg/dL   Calcium 8.3 (L) 8.9 - 10.3 mg/dL   GFR calc non Af Amer >60 >60 mL/min   GFR calc Af Amer >60 >60 mL/min    Comment: (NOTE) The eGFR has been calculated using the CKD EPI equation. This calculation has not been validated in all clinical situations. eGFR's persistently <60 mL/min signify possible Chronic Kidney Disease.    Anion gap 9 5 - 15    Comment: Performed at [facility], [place] [address], [place], [place] [address]  Hepatic function panel     Status: Abnormal   Collection Time: 10/16/17  3:55 AM  Result Value Ref Range   Total Protein 5.7 (L) 6.5 - 8.1 g/dL   Albumin 3.2 (L) 3.5 - 5.0 g/dL   AST 13 (L) 15 - 41 U/L   ALT 16 14 - 54 U/L   Alkaline Phosphatase 61 38 - 126 U/L   Total Bilirubin 0.5 0.3 - 1.2 mg/dL   Bilirubin, Direct 0.1 0.1 - 0.5 mg/dL   Indirect Bilirubin 0.4 0.3 - 0.9 mg/dL    Comment: Performed at [facility], [place] [address], [place], [place] [address]  Lipase, blood     Status: None   Collection Time: 10/16/17  3:55 AM  Result Value Ref Range   Lipase 27 11 - 51 U/L    Comment: Performed at [facility], [place] [address] [place], [place] [address]  Brain natriuretic peptide     Status: Abnormal   Collection Time: 10/16/17  3:55 AM  Result Value Ref Range   B Natriuretic Peptide 204.0 (H) 0.0 - 100.0 pg/mL    Comment: Performed at [facility], [place] [address] [place], [place] [address]  Valproic acid level     Status: Abnormal   Collection Time: 10/16/17  3:55 AM  Result Value Ref Range   Valproic Acid Lvl 32 (L) 50.0 - 100.0 ug/mL    Comment: Performed at [facility], [place] [address], [place], [place] [address]  TSH     Status: None   Collection Time: 10/16/17  3:55 AM  Result Value Ref Range   TSH 0.793 0.350  - 4.500 uIU/mL    Comment: Performed by a 3rd Generation assay with a functional sensitivity of <=  0.01 uIU/mL. Performed at [facility], [place] [address], [place],  [address]   I-stat troponin, ED     Status: None   Collection Time: 10/16/17  3:59 AM  Result Value Ref Range   Troponin i, poc 0.01 0.00 - 0.08 ng/mL   Comment 3            Comment: Due to the release kinetics of cTnI, a negative result within the first hours of the onset of symptoms does not rule out myocardial infarction with certainty. If myocardial infarction is still suspected, repeat the test at appropriate intervals.    Dg Abd Acute W/chest  Result Date: 10/16/2017 CLINICAL DATA:  Constipation for 1 week, shortness of breath. EXAM: DG ABDOMEN ACUTE W/ 1V CHEST COMPARISON:  Chest radiograph October 06, 2017 and CT abdomen and pelvis October 06, 2017. FINDINGS: Cardiac silhouette is mildly enlarged. Mild vascular congestion. Mediastinal silhouette is nonsuspicious, calcified aortic knob. Lungs are clear, no pleural effusions. No pneumothorax. Soft tissue planes and included osseous structures are unremarkable. Bowel gas pattern is nondilated and nonobstructive. A few scattered air-fluid levels. No intra-abdominal mass effect, pathologic calcifications or free air. Severe calcific atherosclerosis. Surgical clips in the included right abdomen compatible with cholecystectomy. Soft tissue planes and included osseous structures are non-suspicious. Multiple spinal fractures. Old RIGHT pelvic fractures. IMPRESSION: Mild cardiomegaly and mild vascular congestion. Scattered air-fluid levels seen with enteritis. Aortic Atherosclerosis (ICD10-I70.0). Electronically Signed   By: Elon Alas M.D.   On: 10/16/2017 04:45    Pending Labs Unresulted Labs (From admission, onward)   Start     Ordered   10/16/17 0524  Osmolality  Once,   R     10/16/17 0523   10/16/17 0524  Cortisol  Once,   R     10/16/17 0523    10/16/17 0524  Osmolality, urine  Once,   R     10/16/17 0523   10/16/17 0524  Sodium, urine, random  Once,   R     10/16/17 0523   Signed and Held  Creatinine, serum  (enoxaparin (LOVENOX)    CrCl >/= 30 ml/min)  Weekly,   R    Comments:  while on enoxaparin therapy    Signed and Held   Signed and Held  Gastrointestinal Panel by PCR , Stool  (Gastrointestinal Panel by PCR, Stool)  Once,   R     Signed and Held   Signed and Held  C difficile quick scan w PCR reflex  (C Difficile quick screen w PCR reflex panel)  Once, for 24 hours,   R     Signed and Held   Signed and Held  Comprehensive metabolic panel  Tomorrow morning,   R     Signed and Held   Signed and Held  CBC  Tomorrow morning,   R     Signed and Held      Vitals/Pain Today's Vitals   10/16/17 0507 10/16/17 0544 10/16/17 0600 10/16/17 0630  BP:  (!) 141/106 (!) 176/76 (!) 159/75  Pulse:  62 63 64  Resp:  17 14 17   Temp:      TempSrc:      SpO2:  94% 96% 96%  PainSc: Asleep       Isolation Precautions No active isolations  Medications Medications  albuterol (PROVENTIL) (2.5 MG/3ML) 0.083% nebulizer solution 5 mg (5 mg Nebulization Given 10/16/17 0408)    Mobility walks with device

## 2017-10-16 NOTE — ED Notes (Signed)
RN asked pt is she would like something to eat. Pt replied "No just something to drink." RN provided a diet coke.

## 2017-10-17 LAB — GASTROINTESTINAL PANEL BY PCR, STOOL (REPLACES STOOL CULTURE)

## 2017-10-17 LAB — COMPREHENSIVE METABOLIC PANEL
ALK PHOS: 55 U/L (ref 38–126)
ALT: 14 U/L (ref 14–54)
ANION GAP: 12 (ref 5–15)
AST: 11 U/L — ABNORMAL LOW (ref 15–41)
Albumin: 3.1 g/dL — ABNORMAL LOW (ref 3.5–5.0)
BILIRUBIN TOTAL: 0.3 mg/dL (ref 0.3–1.2)
BUN: 7 mg/dL (ref 6–20)
CALCIUM: 8.6 mg/dL — AB (ref 8.9–10.3)
CO2: 25 mmol/L (ref 22–32)
CREATININE: 0.4 mg/dL — AB (ref 0.44–1.00)
Chloride: 91 mmol/L — ABNORMAL LOW (ref 101–111)
GFR calc non Af Amer: >60 mL/min (ref >60)
Glucose, Bld: 95 mg/dL (ref 65–99)
Potassium: 3.9 mmol/L (ref 3.5–5.1)
SODIUM: 128 mmol/L — AB (ref 135–145)
TOTAL PROTEIN: 5.9 g/dL — AB (ref 6.5–8.1)

## 2017-10-17 LAB — CBC
HCT: 33.9 % — ABNORMAL LOW (ref 36.0–46.0)
Hemoglobin: 11.3 g/dL — ABNORMAL LOW (ref 12.0–15.0)
MCH: 29.6 pg (ref 26.0–34.0)
MCHC: 33.3 g/dL (ref 30.0–36.0)
MCV: 88.7 fL (ref 78.0–100.0)
Platelets: 194 K/uL (ref 150–400)
RBC: 3.82 MIL/uL — ABNORMAL LOW (ref 3.87–5.11)
RDW: 15 % (ref 11.5–15.5)
WBC: 6.7 K/uL (ref 4.0–10.5)

## 2017-10-17 MED ORDER — SODIUM CHLORIDE 0.9 % IV SOLN: INTRAVENOUS | Status: DC

## 2017-10-17 MED ORDER — OXYCODONE HCL 5 MG PO TABS
5.0000 mg | ORAL_TABLET | Freq: Four times a day (QID) | ORAL | Status: DC | PRN
  Administered 2017-10-17 – 2017-10-19 (×7): 5 mg via ORAL
  Filled 2017-10-17 (×7): qty 1

## 2017-10-17 NOTE — Progress Notes (Signed)
[facility] Progress Note  Subjective: breathing a little better, c/o abd pain. Na up to 128 today.    Vitals:   10/17/17 0500 10/17/17 0748 10/17/17 0841 10/17/17 1517  BP:   (!) 153/54   Pulse:  75 (!) 56 (!) 52  Resp:  16 16 16   Temp:      TempSrc:      SpO2:  98% 97% 96%  Weight: 87.9 kg (193 lb 12.6 oz)     Height:        Inpatient medications: . albuterol  2.5 mg Nebulization TID  . ALPRAZolam  0.5 mg Oral TID  . aspirin  81 mg Oral Daily  . buPROPion  150 mg Oral Daily  . carvedilol  25 mg Oral BID WC  . [START ON 10/21/2017] cloNIDine  0.3 mg Transdermal Weekly  . divalproex  250 mg Oral Q12H  . enoxaparin (LOVENOX) injection  40 mg Subcutaneous Q24H  . ferrous sulfate  325 mg Oral Q breakfast  . fluticasone  1 spray Each Nare BID  . fluticasone furoate-vilanterol  1 puff Inhalation Daily  . hydrALAZINE  100 mg Oral Q8H  . lurasidone  80 mg Oral Q breakfast  . magnesium oxide  800 mg Oral BID  . Melatonin  10 mg Oral QHS  . multivitamin with minerals  1 tablet Oral Daily  . NIFEdipine  90 mg Oral Daily  . pantoprazole  40 mg Oral Daily  . PARoxetine  20 mg Oral Daily  . polyethylene glycol  17 g Oral Daily  . pravastatin  20 mg Oral Daily  . senna  1 tablet Oral Daily  . sodium chloride  1 g Oral TID WC  . sucralfate  1 g Oral QID   . sodium chloride 75 mL/hr at 10/17/17 1500   acetaminophen **OR** acetaminophen, albuterol, loperamide, magnesium hydroxide, oxyCODONE, promethazine  Exam: Alert, no distress, lying flat No jvd Chest slight exp wheezing, no rales RRR no mrg Abd obese soft ntnd no ascites, no hsm Ext no LE edema, no joint effusions NF, Ox 3   Brief Summary:Natalie Thomas  is a 57 y.o. female, w hypertension, Dm2, CHF (EF ?), Copd on 2L [place], gerd, apparently c/o dyspnea and slight cough.  Pt apparently told ER that she had constipation. And abdominal pain.  Pt not a very good historian and unable to tell if she gained weight or  anything further.    In ED,  Abd xray IMPRESSION: Mild cardiomegaly and mild vascular congestion.  Scattered air-fluid levels seen with enteritis.  Aortic Atherosclerosis (ICD10-I70.0).   Wbc 8.5, Hgb 11.7, Plt 203  Na 113 (prior 131) Bun 5, Creatinine 0.34 Alb 3.2 Ast 13, Alt 16 Lipase 27 BNP 204.0 Valproic acid 32  Tsh 0.793  Pt admitted for enteritis, constipation, and hyponatremia, and dyspnea.          Impression/Plan:  Dyspnea : secondary to copd exacerbation; improving Solumedrol 80mg  iv q8h levaquin 500mg  iv qday Anoro 1puff qday Albuterol neb q6h and q6h prn  Hyponatremia: recurrent issue see old chart. Significant improvement overnight , Na 128 - cortisol, TSH and wnl - low UNa, low Uosm - Na+ correcting w/ IVF"s, cont NS at 75/hr - continues w/ home salt tabs 1gm tid as well - check bmet in am  Anemia Check cbc in am Consider further w/up with ferritin, iron, tibc, b12, folate  CHF Check cardiac echo, reading pending Cont carvedilol 25mg  po bid Cont hydralazine 100mg  po tid  Holding home lasix (40 mg /d)  Dm2 fsbs ac and qhs, ISS  Mood disorder Cont Latuda  HTN : on 4 home BP meds + lasix 40/d - cont home BP meds except lasix    DVT Prophylaxis   Lovenox - SCDs   Code Status FULL CODE  Likely DC to  home  Condition GUARDED    Consults called: none  Admission status: inpatient     Kelly Splinter MD [facility] pgr [phone] 10/17/2017, 5:43 PM   Recent Labs  Lab 10/16/17 0355 10/17/17 0551  NA 113* 128*  K 3.9 3.9  CL 77* 91*  CO2 27 25  GLUCOSE 114* 95  BUN 5* 7  CREATININE 0.34* 0.40*  CALCIUM 8.3* 8.6*   Recent Labs  Lab 10/16/17 0355 10/17/17 0551  AST 13* 11*  ALT 16 14  ALKPHOS 61 55  BILITOT 0.5 0.3  PROT 5.7* 5.9*  ALBUMIN 3.2* 3.1*   Recent Labs  Lab 10/16/17 0355 10/17/17 0551  WBC 8.5 6.7  NEUTROABS 5.7  --   HGB 11.7* 11.3*  HCT 34.1* 33.9*  MCV  84.8 88.7  PLT 203 194   Iron/TIBC/Ferritin/ %Sat No results found for: IRON, TIBC, FERRITIN, IRONPCTSAT

## 2017-10-18 DIAGNOSIS — D649 Anemia, unspecified: Secondary | ICD-10-CM

## 2017-10-18 DIAGNOSIS — F419 Anxiety disorder, unspecified: Secondary | ICD-10-CM

## 2017-10-18 DIAGNOSIS — E871 Hypo-osmolality and hyponatremia: Secondary | ICD-10-CM

## 2017-10-18 DIAGNOSIS — F32A Depression, unspecified: Secondary | ICD-10-CM

## 2017-10-18 DIAGNOSIS — I5032 Chronic diastolic (congestive) heart failure: Secondary | ICD-10-CM

## 2017-10-18 DIAGNOSIS — J441 Chronic obstructive pulmonary disease with (acute) exacerbation: Principal | ICD-10-CM

## 2017-10-18 DIAGNOSIS — R450 Nervousness: Secondary | ICD-10-CM

## 2017-10-18 DIAGNOSIS — F17210 Nicotine dependence, cigarettes, uncomplicated: Secondary | ICD-10-CM

## 2017-10-18 DIAGNOSIS — F329 Major depressive disorder, single episode, unspecified: Secondary | ICD-10-CM

## 2017-10-18 DIAGNOSIS — F39 Unspecified mood [affective] disorder: Secondary | ICD-10-CM

## 2017-10-18 LAB — BASIC METABOLIC PANEL
ANION GAP: 9 (ref 5–15)
BUN: 5 mg/dL — AB (ref 6–20)
CHLORIDE: 94 mmol/L — AB (ref 101–111)
CO2: 28 mmol/L (ref 22–32)
Calcium: 8.7 mg/dL — ABNORMAL LOW (ref 8.9–10.3)
Creatinine, Ser: 0.45 mg/dL (ref 0.44–1.00)
GFR calc Af Amer: >60 mL/min (ref >60)
Glucose, Bld: 87 mg/dL (ref 65–99)
POTASSIUM: 4.5 mmol/L (ref 3.5–5.1)
SODIUM: 131 mmol/L — AB (ref 135–145)

## 2017-10-18 MED ORDER — POLYETHYLENE GLYCOL 3350 17 G PO PACK: 17.0000 g | PACK | Freq: Every day | ORAL | Status: DC | PRN

## 2017-10-18 MED ORDER — METHYLPREDNISOLONE SODIUM SUCC 40 MG IJ SOLR
40.0000 mg | Freq: Three times a day (TID) | INTRAMUSCULAR | Status: DC
  Administered 2017-10-18 – 2017-10-19 (×4): 40 mg via INTRAVENOUS
  Filled 2017-10-18 (×4): qty 1

## 2017-10-18 MED ORDER — ALBUTEROL SULFATE (2.5 MG/3ML) 0.083% IN NEBU
2.5000 mg | INHALATION_SOLUTION | Freq: Two times a day (BID) | RESPIRATORY_TRACT | Status: DC
  Administered 2017-10-18 – 2017-10-19 (×2): 2.5 mg via RESPIRATORY_TRACT
  Filled 2017-10-18 (×2): qty 3

## 2017-10-18 MED ORDER — HYDROXYZINE HCL 25 MG PO TABS
25.0000 mg | ORAL_TABLET | Freq: Three times a day (TID) | ORAL | Status: DC | PRN
  Administered 2017-10-18: 25 mg via ORAL
  Filled 2017-10-18: qty 1

## 2017-10-18 MED ORDER — BUPROPION HCL ER (XL) 300 MG PO TB24
300.0000 mg | ORAL_TABLET | Freq: Every day | ORAL | Status: DC
  Administered 2017-10-19: 300 mg via ORAL
  Filled 2017-10-18: qty 1

## 2017-10-18 NOTE — Consult Note (Signed)
[facility] Face-to-Face Psychiatry Consult   Reason for Consult:  Depression  Referring Physician:  Dr. Starla Link  Patient Identification: [facility] MRN:  [id number] Principal Diagnosis: Anxiety and depression Diagnosis:   Patient Active Problem List   Diagnosis Date Noted  . Dyspnea [R06.00] 10/16/2017  . Anemia [D64.9] 10/16/2017  . COPD exacerbation ([place]) [J44.1] 10/07/2017  . Hyperlipidemia [E78.5] 10/07/2017  . Chronic respiratory failure with hypoxia ([place]) [J96.11] 10/07/2017  . Acute on chronic respiratory failure with hypoxia and hypercapnia (H[facility]) [W09.81, J96.22] 10/07/2017  . Incontinence of urine [R32] 08/14/2011  . Hyponatremia [E87.1] 07/27/2011  . Schizoaffective disorder, bipolar type ([place]) [F25.0] 07/05/2011  . Cough [R05] 05/08/2011  . Encounter for long-term (current) use of other medications [Z79.899] 04/09/2011  . Lumbar disc disease [M51.9] 04/09/2011  . Polycythemia secondary to smoking [D75.1] 04/09/2011  . HIP PAIN, LEFT [M25.559] 12/12/2007  . 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 DIABETES MELLITUS, TYPE II [E11.9] 04/08/2007  . MENOPAUSE, PREMATURE [E28.319] 04/08/2007  . Chronic hyponatremia [E87.1] 04/08/2007  . SCHIZOAFFECTIVE DISORDER [F25.9] 04/08/2007  . DEPRESSION [F32.9] 04/08/2007  . Essential hypertension [I10] 04/08/2007  . COPD [J44.9] 04/08/2007  . GERD [K21.9] 04/08/2007  . OSTEOPOROSIS [M81.0] 04/08/2007  . DYSURIA [R30.0] 04/08/2007    Total Time spent with patient: 1 hour  Subjective:   Natalie Thomas is a 57 y.o. female patient admitted with dyspnea and hyponatremia.  HPI:   Per chart review, patient was admitted with dyspnea secondary to COPD exacerbation and hyponatremia. Sodium was 113 on admission and 131 today. She has a history of depression. She is prescribed Xanax 0.5 mg TID, Wellbutrin 150 mg daily, Depakote 250 mg BID, Latuda 80 mg daily and Melatonin 10 mg qhs. PMP indicates no recently filled prescriptions  for controlled substances. She last filled a prescription for Norco in 09/2015.   Of note, patient was last seen by the psychiatry service in 04/2012 for medication management. Lithium was discontinued since it was thought to be contributing to persistent hyponatremia. She was started on Depakote ER 750 mg qhs and Zyprexa was continued.   On interview, Ms. Haxton reports a history of depression schizoaffective disorder.  She reports that Dr. Fredderick Phenix prescribes her medication at her living facility.  She was recently placed on Xanax for anxiety.  She reports a history of panic attacks and reports having one today. She believes that she needs further medication adjustments for her "nerves" because she feels like she is "burning in her skin."  She reports a depressed mood due to a strained relationship with her aunt.  She reports that they frequently argue.   She denies SI, HI or AVH.  She denies any problems with sleep or appetite.  She requires assistance with her ADLs.  Past Psychiatric History: Depression and bipolar disorder. She has a history of one suicide attempt in 2012 by cutting her wrist.   Risk to Self: Is patient at risk for suicide?: No Risk to Others:  None. Denies HI.  Prior Inpatient Therapy:  She has a history of prior hospitalizations including [facility].  Prior Outpatient Therapy:  Prior medications include Zyprexa.   Past Medical History:  Past Medical History:  Diagnosis Date  . CHF (congestive heart failure) ([place])   . COPD (chronic obstructive pulmonary disease) ([place])   . Depression   . Diabetes mellitus, type 2 ([place])    pt denies but states that she has been treated for DM  . GERD (gastroesophageal reflux disease)   .  Hypertension   . Osteoporosis     Past Surgical History:  Procedure Laterality Date  . EYE SURGERY     on left  eye, pt states that she sees bad out of the right eye and needs surgery there  . PELVIC FRACTURE SURGERY     Family History: History reviewed. No  pertinent family history. Family Psychiatric  History: Denies  Social History:  Social History   Substance and Sexual Activity  Alcohol Use No     Social History   Substance and Sexual Activity  Drug Use No    Social History   Socioeconomic History  . Marital status: Divorced    Spouse name: Not on file  . Number of children: Not on file  . Years of education: Not on file  . Highest education level: Not on file  Occupational History  . Occupation: Disabled  Social Needs  . Financial resource strain: Not on file  . Food insecurity:    Worry: Not on file    Inability: Not on file  . Transportation needs:    Medical: Not on file    Non-medical: Not on file  Tobacco Use  . Smoking status: Current Every Day Smoker    Packs/day: 0.25    Types: Cigarettes  . Smokeless tobacco: Never Used  Substance and Sexual Activity  . Alcohol use: No  . Drug use: No  . Sexual activity: Never  Lifestyle  . Physical activity:    Days per week: Not on file    Minutes per session: Not on file  . Stress: Not on file  Relationships  . Social connections:    Talks on phone: Not on file    Gets together: Not on file    Attends religious service: Not on file    Active member of club or organization: Not on file    Attends meetings of clubs or organizations: Not on file    Relationship status: Not on file  Other Topics Concern  . Not on file  Social History Narrative   single   Additional Social History: She lives at [facility] x 6 months. She denies alcohol or illicit substance use.     Allergies:   Allergies  Allergen Reactions  . Clarithromycin Itching  . Penicillins Itching    Has patient had a PCN reaction causing immediate rash, facial/tongue/throat swelling, SOB or lightheadedness with hypotension: No Has patient had a PCN reaction causing severe rash involving mucus membranes or skin necrosis: No Has patient had a PCN reaction that required hospitalization:  Unknown Has patient had a PCN reaction occurring within the last 10 years: No If all of the above answers are "NO", then may proceed with Cephalosporin use.     Labs:  Results for orders placed or performed during the hospital encounter of 10/16/17 (from the past 48 hour(s))  Osmolality, urine     Status: Abnormal   Collection Time: 10/16/17  3:23 PM  Result Value Ref Range   Osmolality, Ur 73 (L) 300 - 900 mOsm/kg    Comment: Performed at [facility], [address], [place], [place] [address]  Sodium, urine, random     Status: None   Collection Time: 10/16/17  3:23 PM  Result Value Ref Range   Sodium, Ur <10 mmol/L    Comment: Performed at [facility], [place] [address] [place], [place] [address]  Gastrointestinal Panel by PCR , Stool     Status: None  Collection Time: 10/16/17  3:43 PM  Result Value Ref Range   Campylobacter species NOT DETECTED NOT DETECTED   Plesimonas shigelloides NOT DETECTED NOT DETECTED   Salmonella species NOT DETECTED NOT DETECTED   Yersinia enterocolitica NOT DETECTED NOT DETECTED   Vibrio species NOT DETECTED NOT DETECTED   Vibrio cholerae NOT DETECTED NOT DETECTED   Enteroaggregative E coli (EAEC) NOT DETECTED NOT DETECTED   Enteropathogenic E coli (EPEC) NOT DETECTED NOT DETECTED   Enterotoxigenic E coli (ETEC) NOT DETECTED NOT DETECTED   Shiga like toxin producing E coli (STEC) NOT DETECTED NOT DETECTED   Shigella/Enteroinvasive E coli (EIEC) NOT DETECTED NOT DETECTED   Cryptosporidium NOT DETECTED NOT DETECTED   Cyclospora cayetanensis NOT DETECTED NOT DETECTED   Entamoeba histolytica NOT DETECTED NOT DETECTED   Giardia lamblia NOT DETECTED NOT DETECTED   Adenovirus F40/41 NOT DETECTED NOT DETECTED   Astrovirus NOT DETECTED NOT DETECTED   Norovirus GI/GII NOT DETECTED NOT DETECTED   Rotavirus A NOT DETECTED NOT DETECTED   Sapovirus (I, II, IV, and V) NOT DETECTED NOT DETECTED    Comment: Performed at [facility], [address] [place], [place] [address]  Comprehensive metabolic panel     Status: Abnormal   Collection Time: 10/17/17  5:51 AM  Result Value Ref Range   Sodium 128 (L) 135 - 145 mmol/L    Comment: DELTA CHECK NOTED REPEATED TO VERIFY    Potassium 3.9 3.5 - 5.1 mmol/L    Comment: REPEATED TO VERIFY   Chloride 91 (L) 101 - 111 mmol/L    Comment: REPEATED TO VERIFY   CO2 25 22 - 32 mmol/L    Comment: REPEATED TO VERIFY   Glucose, Bld 95 65 - 99 mg/dL   BUN 7 6 - 20 mg/dL   Creatinine, Ser 0.40 (L) 0.44 - 1.00 mg/dL   Calcium 8.6 (L) 8.9 - 10.3 mg/dL    Comment: REPEATED TO VERIFY   Total Protein 5.9 (L) 6.5 - 8.1 g/dL   Albumin 3.1 (L) 3.5 - 5.0 g/dL   AST 11 (L) 15 - 41 U/L   ALT 14 14 - 54 U/L   Alkaline Phosphatase 55 38 - 126 U/L   Total Bilirubin 0.3 0.3 - 1.2 mg/dL   GFR calc non Af Amer >60 >60 mL/min   GFR calc Af Amer >60 >60 mL/min    Comment: (NOTE) The eGFR has been calculated using the CKD EPI equation. This calculation has not been validated in all clinical situations. eGFR's persistently <60 mL/min signify possible Chronic Kidney Disease.    Anion gap 12 5 - 15    Comment: REPEATED TO VERIFY Performed at [place] [address] [place], [place] [address]   CBC     Status: Abnormal   Collection Time: 10/17/17  5:51 AM  Result Value Ref Range   WBC 6.7 4.0 - 10.5 K/uL   RBC 3.82 (L) 3.87 - 5.11 MIL/uL   Hemoglobin 11.3 (L) 12.0 - 15.0 g/dL   HCT 33.9 (L) 36.0 - 46.0 %   MCV 88.7 78.0 - 100.0 fL   MCH 29.6 26.0 - 34.0 pg   MCHC 33.3 30.0 - 36.0 g/dL   RDW 15.0 11.5 - 15.5 %   Platelets 194 150 - 400 K/uL    Comment: Performed at [facility], [place] [address], [place], [place] [address]  Basic metabolic panel     Status: Abnormal   Collection Time: 10/18/17  6:02 AM  Result Value Ref Range   Sodium 131 (L) 135 - 145 mmol/L   Potassium 4.5 3.5 - 5.1 mmol/L   Chloride 94 (L) 101 - 111 mmol/L    CO2 28 22 - 32 mmol/L   Glucose, Bld 87 65 - 99 mg/dL   BUN 5 (L) 6 - 20 mg/dL   Creatinine, Ser 0.45 0.44 - 1.00 mg/dL   Calcium 8.7 (L) 8.9 - 10.3 mg/dL   GFR calc non Af Amer >60 >60 mL/min   GFR calc Af Amer >60 >60 mL/min    Comment: (NOTE) The eGFR has been calculated using the CKD EPI equation. This calculation has not been validated in all clinical situations. eGFR's persistently <60 mL/min signify possible Chronic Kidney Disease.    Anion gap 9 5 - 15    Comment: Performed at [facility], [place] [address] [place], [place] [address]    Current Facility-Administered Medications  Medication Dose Route Frequency Provider Last Rate Last Dose  . 0.9 %  sodium chloride infusion   Intravenous Continuous Roney Jaffe, MD 75 mL/hr at 10/17/17 1500    . acetaminophen (TYLENOL) tablet 650 mg  650 mg Oral Q6H PRN Jani Gravel, MD   650 mg at 10/17/17 5621   Or  . acetaminophen (TYLENOL) suppository 650 mg  650 mg Rectal Q6H PRN Jani Gravel, MD      . albuterol (PROVENTIL) (2.5 MG/3ML) 0.083% nebulizer solution 2.5 mg  2.5 mg Nebulization Q6H PRN Jani Gravel, MD      . albuterol (PROVENTIL) (2.5 MG/3ML) 0.083% nebulizer solution 2.5 mg  2.5 mg Nebulization TID Bennie Pierini, MD   2.5 mg at 10/18/17 0740  . ALPRAZolam Duanne Moron) tablet 0.5 mg  0.5 mg Oral TID Jani Gravel, MD   0.5 mg at 10/18/17 1001  . aspirin chewable tablet 81 mg  81 mg Oral Daily Jani Gravel, MD   81 mg at 10/18/17 1000  . buPROPion (WELLBUTRIN XL) 24 hr tablet 150 mg  150 mg Oral Daily Jani Gravel, MD   150 mg at 10/18/17 1000  . carvedilol (COREG) tablet 25 mg  25 mg Oral BID WC Jani Gravel, MD   25 mg at 10/18/17 1000  . [START ON 10/21/2017] cloNIDine (CATAPRES - Dosed in mg/24 hr) patch 0.3 mg  0.3 mg Transdermal Weekly Jani Gravel, MD      . divalproex (DEPAKOTE) DR tablet 250 mg  250 mg Oral Q12H Jani Gravel, MD   250 mg at 10/18/17 1000  . ferrous sulfate tablet 325 mg  325 mg Oral Q breakfast Jani Gravel, MD   325 mg at 10/18/17 1000  . fluticasone (FLONASE) 50 MCG/ACT nasal spray 1 spray  1 spray Each Nare BID Jani Gravel, MD   1 spray at 10/18/17 1001  . fluticasone furoate-vilanterol (BREO ELLIPTA) 100-25 MCG/INH 1 puff  1 puff Inhalation Daily Jani Gravel, MD   1 puff at 10/18/17 0740  . hydrALAZINE (APRESOLINE) tablet 100 mg  100 mg Oral Q8H Jani Gravel, MD   100 mg at 10/18/17 0532  . loperamide (IMODIUM) capsule 2 mg  2 mg Oral PRN Jani Gravel, MD      . lurasidone (LATUDA) tablet 80 mg  80 mg Oral Q breakfast Jani Gravel, MD   80 mg at 10/18/17 0959  . magnesium hydroxide (MILK OF MAGNESIA) suspension 30 mL  30 mL Oral QHS PRN Jani Gravel, MD      . magnesium oxide (  MAG-OX) tablet 800 mg  800 mg Oral BID Jani Gravel, MD   800 mg at 10/18/17 1000  . Melatonin TABS 10 mg  10 mg Oral Loma Sousa, MD   10 mg at 10/17/17 2108  . multivitamin with minerals tablet 1 tablet  1 tablet Oral Daily Jani Gravel, MD   1 tablet at 10/18/17 1000  . NIFEdipine (PROCARDIA-XL/ADALAT-CC/NIFEDICAL-XL) 30 mg, NIFEdipine (PROCARDIA-XL/ADALAT CC) 60 mg  90 mg Oral Daily Jani Gravel, MD   90 mg at 10/18/17 0959  . oxyCODONE (Oxy IR/ROXICODONE) immediate release tablet 5 mg  5 mg Oral Q6H PRN Roney Jaffe, MD   5 mg at 10/18/17 1000  . pantoprazole (PROTONIX) EC tablet 40 mg  40 mg Oral Daily Jani Gravel, MD   40 mg at 10/18/17 1000  . PARoxetine (PAXIL) tablet 20 mg  20 mg Oral Daily Jani Gravel, MD   20 mg at 10/18/17 1000  . polyethylene glycol (MIRALAX / GLYCOLAX) packet 17 g  17 g Oral Daily Jani Gravel, MD   17 g at 10/17/17 1119  . pravastatin (PRAVACHOL) tablet 20 mg  20 mg Oral Daily Jani Gravel, MD   20 mg at 10/18/17 1000  . promethazine (PHENERGAN) tablet 25 mg  25 mg Oral Q6H PRN Jani Gravel, MD   25 mg at 10/16/17 1020  . senna (SENOKOT) tablet 8.6 mg  1 tablet Oral Daily Jani Gravel, MD   8.6 mg at 10/17/17 1120  . sodium chloride tablet 1 g  1 g Oral TID WC Jani Gravel, MD   1 g at 10/18/17 1000  .  sucralfate (CARAFATE) tablet 1 g  1 g Oral QID Jani Gravel, MD   1 g at 10/18/17 1000    Musculoskeletal: Strength & Muscle Tone: within normal limits Gait & Station: UTA since patient is lying in bed. Patient leans: N/A  Psychiatric Specialty Exam: Physical Exam  Nursing note and vitals reviewed. Constitutional: She is oriented to person, place, and time. She appears well-developed and well-nourished.  HENT:  Head: Normocephalic and atraumatic.  Neck: Normal range of motion.  Respiratory: Effort normal.  Musculoskeletal: Normal range of motion.  Neurological: She is alert and oriented to person, place, and time.  Skin: No rash noted.  Psychiatric: Her speech is normal and behavior is normal. Judgment and thought content normal. Her mood appears anxious. Cognition and memory are normal.    Review of Systems  Constitutional: Negative for chills and fever.  Cardiovascular: Negative for chest pain.  Gastrointestinal: Negative for abdominal pain, constipation, diarrhea, nausea and vomiting.  Psychiatric/Behavioral: Positive for depression. Negative for hallucinations, substance abuse and suicidal ideas. The patient is nervous/anxious.   All other systems reviewed and are negative.   Blood pressure (!) 151/50, pulse (!) 53, temperature 97.7 F (36.5 C), resp. rate 16, height 5' 4.02" (1.626 m), weight 91.5 kg (201 lb 11.2 oz), SpO2 97 %.Body mass index is 34.6 kg/m.  General Appearance: Fairly Groomed, middle aged, obese, edentulous, Caucasian female wearing a hospital gown with [place] in place and lying in bed. NAD.   Eye Contact:  Good  Speech:  Clear and Coherent and Normal Rate  Volume:  Normal  Mood:  Anxious and Depressed  Affect:  Dysphoric  Thought Process:  Goal Directed, Linear and Descriptions of Associations: Intact  Orientation:  Full (Time, Place, and Person)  Thought Content:  Logical  Suicidal Thoughts:  No  Homicidal Thoughts:  No  Memory:  Immediate;   Fair  Recent;    Fair Remote;   Fair  Judgement:  Fair  Insight:  Fair  Psychomotor Activity:  Normal  Concentration:  Concentration: Good and Attention Span: Good  Recall:  [employer] of Knowledge:  Fair  Language:  Good  Akathisia:  No  Handed:  Right  AIMS (if indicated):   N/A  Assets:  Communication Skills Desire for Improvement Housing  ADL's:  Impaired  Cognition:  WNL  Sleep:   Okay   Assessment: LORETHA URE is a 57 y.o. female who was admitted with dyspnea secondary to COPD exacerbation and hyponatremia. She reports symptoms of depression due to family stressors and anxiety. She denies SI, HI or AVH. Recommend increasing Wellbutrin for mood and starting Atarax for anxiety.   Treatment Plan Summary: -Continue Depakote 250 mg BID and Latuda 80 mg daily for mood stabilization. -Continue Xanax 0.5 mg TID for anxiety.  -Start Atarax 25 mg TID PRN for anxiety.  -Increase Wellbutrin XL 150 mg daily to 300 mg daily for mood.  -Psychiatry will sign off on patient at this time. Please consult psychiatry again as needed.   Disposition: No evidence of imminent risk to self or others at present.   Patient does not meet criteria for psychiatric inpatient admission.  Faythe Dingwall, DO 10/18/2017 12:15 PM

## 2017-10-18 NOTE — Clinical Social Work Note (Signed)
Clinical Social Work Assessment  Patient Details  Name: Natalie Thomas MRN: [id number] Date of Birth: [birth year]  Date of referral:  10/18/17               Reason for consult:  Facility Placement                Permission sought to share information with:  [place] granted to share information::  No(Patient is not oriented. Has legal guardian)  Name::     Immunologist::  [place]  Relationship::  Guardian  Contact Information:     Housing/Transportation Living arrangements for the past 2 months:  [place] Information:  Facility Patient Interpreter Needed:  None Criminal Activity/Legal Involvement Pertinent to Current Situation/Hospitalization:  No - Comment as needed Significant Relationships:  Warehouse manager Lives with:  Facility Resident Do you feel safe going back to the place where you live?    Need for family participation in patient care:     Care giving concerns:  No care giving concerns at time of assessment.    Social Worker assessment / plan:  LCSW following for facility placement.   Patient admitted for SOB and Abdominal pain.   Patient has legal guardian.   Patient is a resident at [facility]. According to facility patient has been living at the facility since Jan of 2019. At baseline facility reports that patient uses a walker to ambulate. Facility reports that patient is independent with ADLs, but needs assistance with toileting and transfers.    LCSW left message for patients Legal Guardian.   PLAN: Return to ALF at dc.   Employment status:  Disabled (Comment on whether or not currently receiving Disability) Insurance information:  Managed Medicare PT Recommendations:  Not assessed at this time Information / Referral to community resources:     Patient/Family's Response to care:  Unable to assess.   Patient/Family's Understanding of and Emotional Response to  Diagnosis, Current Treatment, and Prognosis:  Facility expressed concerns about patients abdominal pain complaints. Facility states patient has a hernia that they are concerned about. Facility is agreeable for patient's return at dc.   Emotional Assessment Appearance:  Appears stated age Attitude/Demeanor/Rapport:    Affect (typically observed):    Orientation:  Oriented to Self Alcohol / Substance use:  Not Applicable Psych involvement (Current and /or in the community):  No (Comment)  Discharge Needs  Concerns to be addressed:  No discharge needs identified Readmission within the last 30 days:  Yes Current discharge risk:  None Barriers to Discharge:  Continued Medical Work up   [employer], LCSW 10/18/2017, 2:10 PM

## 2017-10-18 NOTE — Progress Notes (Signed)
OT Cancellation Note  Patient Details Name: NATALYIA INNES MRN: [id number] DOB: 1960/10/04   Cancelled Treatment:    Reason Eval/Treat Not Completed: Other (comment).  Pt feels poorly and does not want to move at this time. Will try to return tomorrow.  Rahul Malinak 10/18/2017, 1:46 PM  Lesle Chris, OTR/L [phone] 10/18/2017

## 2017-10-18 NOTE — Progress Notes (Signed)
Lab unable to accept stool specimen, stating that stool is too formed.  Dr. Starla Link notified. Precautions discontinued.

## 2017-10-18 NOTE — Progress Notes (Signed)
Patient refused x ray

## 2017-10-18 NOTE — Progress Notes (Signed)
Patient ID: Natalie Thomas, female   DOB: [birth year], 57 y.o.   MRN: [id number]  PROGRESS NOTE    Natalie Thomas  NAT:[id number] DOB: October 07, 1960 DOA: 10/16/2017 PCP: Renato Shin, MD   Brief Narrative:  57 year old female with history of hypertension, diabetes mellitus type 2, question of CHF, COPD on 2 L nasal cannula, GERD, mood disorder presented on 10/16/2017 with dyspnea, cough and abdominal pain.  Patient is a poor historian and reliability on admission was poor.  Patient was admitted for enteritis and dyspnea.   Assessment & Plan:   Principal Problem:   Hyponatremia Active Problems:   Dyspnea   Anemia   Dyspnea probably secondary COPD exacerbation -Patient does not mention of any improvement although the reliability of her history is poor. -It looks like patient is not on any intravenous Solu-Medrol.  Will start Solu Medrol 40 mg IV every 8 hours.  Continue albuterol neb and in Breo Ellipta -Incentive spirometry -Repeat chest x-ray  Probable chronic diastolic CHF -No signs of fluid overload -Echo shows EF of 55 to 60% with grade 1 diastolic dysfunction -Lasix on hold -Continue hydralazine and Coreg  Hyponatremia -Recurrent issue. -Sodium is 131 today -Cortisol and TSH levels normal -Currently on normal saline at 75 cc an hour.  Also on salt tablets 1 g 3 times daily -Repeat a.m. Labs  Question of enteritis -Stool GI PCR negative.  Stool C. difficile pending.  Will discontinue laxatives for now -Repeat abdominal x-ray today  Mood disorder with probable depression -Continue Latuda, bupropion, alprazolam, paroxetine.  Patient is adamant about inpatient psychiatric evaluation.  Will consult psychiatry  Diabetes mellitus type 2 -Continue Accu-Cheks with sliding scale coverage  Hypertension -Monitor blood pressure.  Continue nifedipine, Coreg and hydralazine.  Lasix on hold  Chronic normocytic anemia -Hemoglobin stable.  DVT prophylaxis: SCDs Code Status:  Full Family Communication: None at bedside Disposition Plan: Depends on clinical outcome  Consultants: Psychiatry  Procedures:  Study Conclusions  - Left ventricle: The cavity size was normal. There was mild focal   basal hypertrophy of the septum. Systolic function was normal.   The estimated ejection fraction was in the range of 55% to 60%.   Wall motion was normal; there were no regional wall motion   abnormalities. Doppler parameters are consistent with abnormal   left ventricular relaxation (grade 1 diastolic dysfunction).   Doppler parameters are consistent with high ventricular filling   pressure. - Aortic valve: There was trivial regurgitation. - Left atrium: The atrium was mildly dilated. - Pericardium, extracardiac: A small pericardial effusion was   identified.  Impressions:  - Normal LV systolic function; mild diastolic dysfunction; elevated   LV filling pressure; mild LAE; small pericardial effusion.   Antimicrobials:  None   Subjective: Patient seen and examined at bedside.  She is intermittently very agitated and states that she is not feeling well and is demanding psychiatry evaluation.  She thinks that she has a stomach virus.  No overnight fever or vomiting.  Objective: Vitals:   10/17/17 2021 10/18/17 0435 10/18/17 0438 10/18/17 0740  BP:  (!) 151/50    Pulse:  (!) 53    Resp:  16    Temp:  97.7 F (36.5 C)    TempSrc:      SpO2: 97% 96%  97%  Weight:   91.5 kg (201 lb 11.2 oz)   Height:        Intake/Output Summary (Last 24 hours) at 10/18/2017 1322 Last data filed at 10/18/2017  1048 Gross per 24 hour  Intake 240 ml  Output 2550 ml  Net -2310 ml   Filed Weights   10/16/17 1650 10/17/17 0500 10/18/17 0438  Weight: 88.4 kg (194 lb 14.2 oz) 87.9 kg (193 lb 12.6 oz) 91.5 kg (201 lb 11.2 oz)    Examination:  General exam: Appears intermittently agitated.  Awake  Respiratory system: Bilateral decreased breath sound at  bases Cardiovascular system: S1 & S2 heard, intermittent bradycardia  gastrointestinal system: Abdomen is soft, nondistended, soft and mild periumbilical tenderness. Normal bowel sounds heard. Central nervous system: Alert and intermittently agitated. No focal neurological deficits. Moving extremities Extremities: No cyanosis, clubbing; Trace edema  Skin: No rashes, lesions or ulcers Psychiatry: Intermittently agitated.  Poor historian    Data Reviewed: I have personally reviewed following labs and imaging studies  CBC: Recent Labs  Lab 10/16/17 0355 10/17/17 0551  WBC 8.5 6.7  NEUTROABS 5.7  --   HGB 11.7* 11.3*  HCT 34.1* 33.9*  MCV 84.8 88.7  PLT 203 510   Basic Metabolic Panel: Recent Labs  Lab 10/16/17 0355 10/17/17 0551 10/18/17 0602  NA 113* 128* 131*  K 3.9 3.9 4.5  CL 77* 91* 94*  CO2 27 25 28   GLUCOSE 114* 95 87  BUN 5* 7 5*  CREATININE 0.34* 0.40* 0.45  CALCIUM 8.3* 8.6* 8.7*   GFR: Estimated Creatinine Clearance: 86 mL/min (by C-G formula based on SCr of 0.45 mg/dL). Liver Function Tests: Recent Labs  Lab 10/16/17 0355 10/17/17 0551  AST 13* 11*  ALT 16 14  ALKPHOS 61 55  BILITOT 0.5 0.3  PROT 5.7* 5.9*  ALBUMIN 3.2* 3.1*   Recent Labs  Lab 10/16/17 0355  LIPASE 27   No results for input(s): AMMONIA in the last 168 hours. Coagulation Profile: No results for input(s): INR, PROTIME in the last 168 hours. Cardiac Enzymes: No results for input(s): CKTOTAL, CKMB, CKMBINDEX, TROPONINI in the last 168 hours. BNP (last 3 results) No results for input(s): PROBNP in the last 8760 hours. HbA1C: No results for input(s): HGBA1C in the last 72 hours. CBG: No results for input(s): GLUCAP in the last 168 hours. Lipid Profile: No results for input(s): CHOL, HDL, LDLCALC, TRIG, CHOLHDL, LDLDIRECT in the last 72 hours. Thyroid Function Tests: Recent Labs    10/16/17 0355  TSH 0.793   Anemia Panel: No results for input(s): VITAMINB12, FOLATE,  FERRITIN, TIBC, IRON, RETICCTPCT in the last 72 hours. Sepsis Labs: No results for input(s): PROCALCITON, LATICACIDVEN in the last 168 hours.  Recent Results (from the past 240 hour(s))  Gastrointestinal Panel by PCR , Stool     Status: None   Collection Time: 10/16/17  3:43 PM  Result Value Ref Range Status   Campylobacter species NOT DETECTED NOT DETECTED Final   Plesimonas shigelloides NOT DETECTED NOT DETECTED Final   Salmonella species NOT DETECTED NOT DETECTED Final   Yersinia enterocolitica NOT DETECTED NOT DETECTED Final   Vibrio species NOT DETECTED NOT DETECTED Final   Vibrio cholerae NOT DETECTED NOT DETECTED Final   Enteroaggregative E coli (EAEC) NOT DETECTED NOT DETECTED Final   Enteropathogenic E coli (EPEC) NOT DETECTED NOT DETECTED Final   Enterotoxigenic E coli (ETEC) NOT DETECTED NOT DETECTED Final   Shiga like toxin producing E coli (STEC) NOT DETECTED NOT DETECTED Final   Shigella/Enteroinvasive E coli (EIEC) NOT DETECTED NOT DETECTED Final   Cryptosporidium NOT DETECTED NOT DETECTED Final   Cyclospora cayetanensis NOT DETECTED NOT DETECTED Final  Entamoeba histolytica NOT DETECTED NOT DETECTED Final   Giardia lamblia NOT DETECTED NOT DETECTED Final   Adenovirus F40/41 NOT DETECTED NOT DETECTED Final   Astrovirus NOT DETECTED NOT DETECTED Final   Norovirus GI/GII NOT DETECTED NOT DETECTED Final   Rotavirus A NOT DETECTED NOT DETECTED Final   Sapovirus (I, II, IV, and V) NOT DETECTED NOT DETECTED Final    Comment: Performed at [facility], [address] [place], [place] [address]         Radiology Studies: No results found.      Scheduled Meds: . albuterol  2.5 mg Nebulization TID  . ALPRAZolam  0.5 mg Oral TID  . aspirin  81 mg Oral Daily  . buPROPion  150 mg Oral Daily  . carvedilol  25 mg Oral BID WC  . [START ON 10/21/2017] cloNIDine  0.3 mg Transdermal Weekly  . divalproex  250 mg Oral Q12H  . ferrous sulfate  325 mg Oral Q  breakfast  . fluticasone  1 spray Each Nare BID  . fluticasone furoate-vilanterol  1 puff Inhalation Daily  . hydrALAZINE  100 mg Oral Q8H  . lurasidone  80 mg Oral Q breakfast  . magnesium oxide  800 mg Oral BID  . Melatonin  10 mg Oral QHS  . multivitamin with minerals  1 tablet Oral Daily  . NIFEdipine  90 mg Oral Daily  . pantoprazole  40 mg Oral Daily  . PARoxetine  20 mg Oral Daily  . polyethylene glycol  17 g Oral Daily  . pravastatin  20 mg Oral Daily  . senna  1 tablet Oral Daily  . sodium chloride  1 g Oral TID WC  . sucralfate  1 g Oral QID   Continuous Infusions: . sodium chloride 75 mL/hr at 10/17/17 1500     LOS: 2 days        Aline August, MD [facility] Pager [phone]  If 7PM-7AM, please contact night-coverage [email or web address] Password [email or web address] 10/18/2017, 1:22 PM

## 2017-10-19 DIAGNOSIS — F419 Anxiety disorder, unspecified: Secondary | ICD-10-CM

## 2017-10-19 DIAGNOSIS — F329 Major depressive disorder, single episode, unspecified: Secondary | ICD-10-CM

## 2017-10-19 LAB — CBC WITH DIFFERENTIAL/PLATELET
Basophils Absolute: 0 10*3/uL (ref 0.0–0.1)
Basophils Relative: 0 %
EOS PCT: 0 %
Eosinophils Absolute: 0 10*3/uL (ref 0.0–0.7)
HEMATOCRIT: 35.8 % — AB (ref 36.0–46.0)
Hemoglobin: 11.6 g/dL — ABNORMAL LOW (ref 12.0–15.0)
LYMPHS ABS: 0.4 10*3/uL — AB (ref 0.7–4.0)
Lymphocytes Relative: 5 %
MCH: 29.3 pg (ref 26.0–34.0)
MCHC: 32.4 g/dL (ref 30.0–36.0)
MCV: 90.4 fL (ref 78.0–100.0)
MONOS PCT: 2 %
Monocytes Absolute: 0.2 10*3/uL (ref 0.1–1.0)
NEUTROS ABS: 7.7 10*3/uL (ref 1.7–7.7)
Neutrophils Relative %: 93 %
Platelets: 241 10*3/uL (ref 150–400)
RBC: 3.96 MIL/uL (ref 3.87–5.11)
RDW: 14.6 % (ref 11.5–15.5)
WBC: 8.3 10*3/uL (ref 4.0–10.5)

## 2017-10-19 LAB — COMPREHENSIVE METABOLIC PANEL
ALT: 13 U/L — ABNORMAL LOW (ref 14–54)
ANION GAP: 9 (ref 5–15)
AST: 11 U/L — ABNORMAL LOW (ref 15–41)
Albumin: 3.1 g/dL — ABNORMAL LOW (ref 3.5–5.0)
Alkaline Phosphatase: 60 U/L (ref 38–126)
BILIRUBIN TOTAL: 0.2 mg/dL — AB (ref 0.3–1.2)
BUN: 8 mg/dL (ref 6–20)
CO2: 28 mmol/L (ref 22–32)
Calcium: 9 mg/dL (ref 8.9–10.3)
Chloride: 91 mmol/L — ABNORMAL LOW (ref 101–111)
Creatinine, Ser: 0.41 mg/dL — ABNORMAL LOW (ref 0.44–1.00)
Glucose, Bld: 161 mg/dL — ABNORMAL HIGH (ref 65–99)
POTASSIUM: 4.6 mmol/L (ref 3.5–5.1)
Sodium: 128 mmol/L — ABNORMAL LOW (ref 135–145)
TOTAL PROTEIN: 6 g/dL — AB (ref 6.5–8.1)

## 2017-10-19 LAB — MAGNESIUM: MAGNESIUM: 1.7 mg/dL (ref 1.7–2.4)

## 2017-10-19 MED ORDER — OXYCODONE HCL 5 MG PO TABS: 5.0000 mg | ORAL_TABLET | Freq: Four times a day (QID) | ORAL | 0 refills | Status: DC | PRN

## 2017-10-19 MED ORDER — OMEPRAZOLE 20 MG PO CPDR: 20.0000 mg | DELAYED_RELEASE_CAPSULE | Freq: Two times a day (BID) | ORAL | Status: DC

## 2017-10-19 MED ORDER — BUPROPION HCL ER (XL) 300 MG PO TB24: 300.0000 mg | ORAL_TABLET | ORAL | 0 refills | Status: DC

## 2017-10-19 MED ORDER — PREDNISONE 20 MG PO TABS: 40.0000 mg | ORAL_TABLET | Freq: Every day | ORAL | 0 refills | Status: AC

## 2017-10-19 MED ORDER — ALPRAZOLAM 0.5 MG PO TABS: 0.5000 mg | ORAL_TABLET | Freq: Three times a day (TID) | ORAL | 0 refills | Status: DC

## 2017-10-19 MED ORDER — PAROXETINE HCL 20 MG PO TABS: 20.0000 mg | ORAL_TABLET | Freq: Every day | ORAL | Status: DC

## 2017-10-19 MED ORDER — POLYETHYLENE GLYCOL 3350 17 G PO PACK: 17.0000 g | PACK | Freq: Every day | ORAL | 0 refills | Status: DC | PRN

## 2017-10-19 MED ORDER — HYDROXYZINE HCL 25 MG PO TABS: 25.0000 mg | ORAL_TABLET | Freq: Three times a day (TID) | ORAL | 0 refills | Status: DC | PRN

## 2017-10-19 NOTE — Progress Notes (Signed)
PT Cancellation Note  Patient Details Name: Natalie Thomas MRN: [id number] DOB: Dec 09, 1960   Cancelled Treatment:    Reason Eval/Treat Not Completed: Other (comment).  Pt states she does not need PT today.  Will attempt again tomorrow if pt is still here.   Ramond Dial 10/19/2017, 12:19 PM   Mee Hives, PT MS Acute Rehab Dept. Number: [place] [facility] and [place]

## 2017-10-19 NOTE — Progress Notes (Signed)
Patient being discharged, report called to facility and AVS sent with packet.

## 2017-10-19 NOTE — NC FL2 (Signed)
[place] MEDICAID FL2 LEVEL OF CARE SCREENING TOOL     IDENTIFICATION  Patient Name: Natalie Thomas Birthdate: July 12, 1960 Sex: female Admission Date (Current Location): 10/16/2017  [facility] and [place] Number:  Herbalist and Address:  [facility],  [place] [address], [place]      Provider Number: [id number]  Attending Physician Name and Address:  Aline August, MD  Relative Name and Phone Number:  [phone] Legal guardian Mona    Current Level of Care: Hospital Recommended Level of Care: Assisted Living Facility([facility]) Prior Approval Number:    Date Approved/Denied:   PASRR Number:    Discharge Plan: Other (Comment)(Assisted Living Facility)    Current Diagnoses: Patient Active Problem List   Diagnosis Date Noted  . Anxiety and depression   . Dyspnea 10/16/2017  . Anemia 10/16/2017  . COPD exacerbation ([place]) 10/07/2017  . Hyperlipidemia 10/07/2017  . Chronic respiratory failure with hypoxia ([place]) 10/07/2017  . Acute on chronic respiratory failure with hypoxia and hypercapnia ([place]) 10/07/2017  . Incontinence of urine 08/14/2011  . Hyponatremia 07/27/2011  . Schizoaffective disorder, bipolar type ([place]) 07/05/2011  . Cough 05/08/2011  . Encounter for Abra Lingenfelter-term (current) use of other medications 04/09/2011  . Lumbar disc disease 04/09/2011  . Polycythemia secondary to smoking 04/09/2011  . HIP PAIN, LEFT 12/12/2007  . 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 DIABETES MELLITUS, TYPE II 04/08/2007  . MENOPAUSE, PREMATURE 04/08/2007  . Chronic hyponatremia 04/08/2007  . SCHIZOAFFECTIVE DISORDER 04/08/2007  . DEPRESSION 04/08/2007  . Essential hypertension 04/08/2007  . COPD 04/08/2007  . GERD 04/08/2007  . OSTEOPOROSIS 04/08/2007  . DYSURIA 04/08/2007    Orientation RESPIRATION BLADDER Height & Weight     Self  O2(nasal cannula 2 L/min) Incontinent Weight: 199 lb 15.3 oz (90.7 kg) Height:  5' 4.02"  (162.6 cm)  BEHAVIORAL SYMPTOMS/MOOD NEUROLOGICAL BOWEL NUTRITION STATUS        Diet(mechanical soft)  AMBULATORY STATUS COMMUNICATION OF NEEDS Skin   Limited Assist Verbally Other (Comment)(MASD Groin Circumferential Barrier Cream   Ecchymosis  foot/abdomen  bilateral cleansed )                       Personal Care Assistance Level of Assistance  Bathing, Feeding, Dressing Bathing Assistance: Maximum assistance Feeding assistance: Independent Dressing Assistance: Maximum assistance     Functional Limitations Info  Sight, Hearing, Speech Sight Info: Adequate Hearing Info: Adequate Speech Info: Adequate    SPECIAL CARE FACTORS FREQUENCY  PT (By licensed PT), OT (By licensed OT)     PT Frequency: 3x OT Frequency: 3x            Contractures Contractures Info: Not present    Additional Factors Info  Code Status, Allergies, Psychotropic Code Status Info: Full Code Allergies Info: Clarithromycin;Penicillins; Psychotropic Info: ALPRAZolam;buPROPion;divalproex;hydrOXYzine;lurasidone         Current Medications (10/19/2017):  This is the current hospital active medication list Current Facility-Administered Medications  Medication Dose Route Frequency Provider Last Rate Last Dose  . acetaminophen (TYLENOL) tablet 650 mg  650 mg Oral Q6H PRN Jani Gravel, MD   650 mg at 10/17/17 5809   Or  . acetaminophen (TYLENOL) suppository 650 mg  650 mg Rectal Q6H PRN Jani Gravel, MD      . albuterol (PROVENTIL) (2.5 MG/3ML) 0.083% nebulizer solution 2.5 mg  2.5 mg Nebulization Q6H PRN Jani Gravel, MD      . albuterol (PROVENTIL) (2.5 MG/3ML) 0.083% nebulizer  solution 2.5 mg  2.5 mg Nebulization BID Aline August, MD   2.5 mg at 10/19/17 0832  . ALPRAZolam Duanne Moron) tablet 0.5 mg  0.5 mg Oral TID Jani Gravel, MD   0.5 mg at 10/19/17 1013  . aspirin chewable tablet 81 mg  81 mg Oral Daily Jani Gravel, MD   81 mg at 10/19/17 1013  . buPROPion (WELLBUTRIN XL) 24 hr tablet 300 mg  300 mg Oral  Daily Starla Link, Kshitiz, MD   300 mg at 10/19/17 1013  . carvedilol (COREG) tablet 25 mg  25 mg Oral BID WC Jani Gravel, MD   25 mg at 10/19/17 0835  . [START ON 10/21/2017] cloNIDine (CATAPRES - Dosed in mg/24 hr) patch 0.3 mg  0.3 mg Transdermal Weekly Jani Gravel, MD      . divalproex (DEPAKOTE) DR tablet 250 mg  250 mg Oral Q12H Jani Gravel, MD   250 mg at 10/19/17 1013  . ferrous sulfate tablet 325 mg  325 mg Oral Q breakfast Jani Gravel, MD   325 mg at 10/19/17 0835  . fluticasone (FLONASE) 50 MCG/ACT nasal spray 1 spray  1 spray Each Nare BID Jani Gravel, MD   1 spray at 10/19/17 1013  . fluticasone furoate-vilanterol (BREO ELLIPTA) 100-25 MCG/INH 1 puff  1 puff Inhalation Daily Jani Gravel, MD   1 puff at 10/19/17 3810  . hydrALAZINE (APRESOLINE) tablet 100 mg  100 mg Oral Q8H Jani Gravel, MD   100 mg at 10/19/17 1751  . hydrOXYzine (ATARAX/VISTARIL) tablet 25 mg  25 mg Oral TID PRN Aline August, MD   25 mg at 10/18/17 2112  . lurasidone (LATUDA) tablet 80 mg  80 mg Oral Q breakfast Jani Gravel, MD   80 mg at 10/19/17 0834  . Melatonin TABS 10 mg  10 mg Oral Loma Sousa, MD   10 mg at 10/18/17 2119  . methylPREDNISolone sodium succinate (SOLU-MEDROL) 40 mg/mL injection 40 mg  40 mg Intravenous Q8H Alekh, Kshitiz, MD   40 mg at 10/19/17 0258  . multivitamin with minerals tablet 1 tablet  1 tablet Oral Daily Jani Gravel, MD   1 tablet at 10/19/17 1013  . NIFEdipine (PROCARDIA-XL/ADALAT-CC/NIFEDICAL-XL) 30 mg, NIFEdipine (PROCARDIA-XL/ADALAT CC) 60 mg  90 mg Oral Daily Jani Gravel, MD   90 mg at 10/18/17 0959  . oxyCODONE (Oxy IR/ROXICODONE) immediate release tablet 5 mg  5 mg Oral Q6H PRN Roney Jaffe, MD   5 mg at 10/19/17 1018  . pantoprazole (PROTONIX) EC tablet 40 mg  40 mg Oral Daily Jani Gravel, MD   40 mg at 10/19/17 1013  . PARoxetine (PAXIL) tablet 20 mg  20 mg Oral Daily Jani Gravel, MD   20 mg at 10/19/17 1013  . polyethylene glycol (MIRALAX / GLYCOLAX) packet 17 g  17 g Oral Daily PRN  Starla Link, Kshitiz, MD      . pravastatin (PRAVACHOL) tablet 20 mg  20 mg Oral Daily Jani Gravel, MD   20 mg at 10/19/17 1013  . promethazine (PHENERGAN) tablet 25 mg  25 mg Oral Q6H PRN Jani Gravel, MD   25 mg at 10/18/17 2335  . senna (SENOKOT) tablet 8.6 mg  1 tablet Oral Daily Jani Gravel, MD   8.6 mg at 10/19/17 1013  . sodium chloride tablet 1 g  1 g Oral TID WC Jani Gravel, MD   1 g at 10/19/17 1208  . sucralfate (CARAFATE) tablet 1 g  1 g Oral QID Jani Gravel, MD  1 g at 10/19/17 1013     Discharge Medications: Please see discharge summary for a list of discharge medications.  Relevant Imaging Results:  Relevant Lab Results:   Additional Information SS#: [id number]  Burnis Medin, LCSW

## 2017-10-19 NOTE — Progress Notes (Signed)
OT Cancellation Note  Patient Details Name: Natalie Thomas MRN: [id number] DOB: May 01, 1961   Cancelled Treatment:    Reason Eval/Treat Not Completed: Patient declined, no reason specified  Carlyon Shadow, [place] 10/19/2017, 12:21 PM

## 2017-10-19 NOTE — Discharge Summary (Signed)
Physician Discharge Summary  Natalie Thomas UMP:[id number] DOB: 06/22/1961 DOA: 10/16/2017  PCP: Renato Shin, MD  Admit date: 10/16/2017 Discharge date: 10/19/2017  Admitted From: ALF Disposition:  ALF  Recommendations for Outpatient Follow-up:  1. Follow up with PCP in 1week repeat CBC/BMP 2. Follow-up with psychiatry at earliest [place]: Yes Equipment/Devices: Oxygen via nasal cannula 2 L/min  Discharge Condition: Guarded CODE STATUS: Full Diet recommendation: Heart Healthy   Brief/Interim Summary: 57 year old female with history of hypertension, diabetes mellitus type 2, question of CHF, COPD on 2 L nasal cannula, GERD, mood disorder presented on 10/16/2017 with dyspnea, cough and abdominal pain.  Patient is a poor historian and reliability on admission was poor.  Patient was admitted for enteritis and dyspnea.  She was placed on Solu-Medrol.  She was also found to be hyponatremic which improved with IV fluids.  Outpatient follow-up of BMP.  Respiratory status remained stable.  She refused repeat chest x-ray and abdominal x-ray ordered for 10/18/2017.  Psychiatry has evaluated her and cleared her for discharge.  She will be discharged back to assisted living facility.     Discharge Diagnoses:  Principal Problem:   Anxiety and depression Active Problems:   Hyponatremia   Dyspnea   Anemia  Dyspnea probably secondary COPD exacerbation -Respite status stable. -Currently on Solu-Medrol.  Switch to oral prednisone 40 mg daily for 7 days.  Continue Breo Ellipta. -Might benefit from outpatient pulmonary evaluation -Repeat chest x-ray ordered for 10/18/2017 was refused by patient  Chronic hypoxic respiratory failure -Continue oxygen supplementation via nasal cannula at 2 L/min  Probable chronic diastolic CHF -No signs of fluid overload -Echo shows EF of 55 to 60% with grade 1 diastolic dysfunction -Lasix on hold -Continue hydralazine and Coreg -Might benefit  from outpatient cardiology evaluation.  Will keep Lasix on hold for now this can be restarted as an outpatient if needed.  Hyponatremia -Recurrent issue. -Sodium is 128 today -Cortisol and TSH levels normal -Currently on normal saline at 75 cc an hour.  Discontinue IV fluids.   Continue salt tablets 1 g 3 times daily -Patient follow-up of BMP.  Might need nephrology evaluation as an outpatient  Question of enteritis -Stool GI PCR negative.  Stool was formed yesterday so C. difficile testing could not be done.  Enteric precautions discontinued.   Use laxatives only as needed -Repeat abdominal x-ray ordered for 10/18/2017 was refused by patient.  Mood disorder with probable depression -Continue Latuda, alprazolam, paroxetine.    Psychiatry evaluation outpatient.  Changed bupropion to 300 mg daily and added hydroxyzine as needed per psychiatric recommendations.  Outpatient follow-up with psychiatry  diabetes mellitus type 2 -Outpatient follow-up  Hypertension -Monitor blood pressure.  Continue nifedipine, Coreg and hydralazine.  Lasix on hold  Chronic normocytic anemia -Hemoglobin stable.    Discharge Instructions  Discharge Instructions    Ambulatory referral to Cardiology   Complete by:  As directed    ?CHF   Call MD for:  difficulty breathing, headache or visual disturbances   Complete by:  As directed    Call MD for:  extreme fatigue   Complete by:  As directed    Call MD for:  hives   Complete by:  As directed    Call MD for:  persistant dizziness or light-headedness   Complete by:  As directed    Call MD for:  persistant nausea and vomiting   Complete by:  As directed    Call MD for:  severe  uncontrolled pain   Complete by:  As directed    Call MD for:  temperature >100.4   Complete by:  As directed    Diet - low sodium heart healthy   Complete by:  As directed    Increase activity slowly   Complete by:  As directed      Allergies as of 10/19/2017       Reactions   Clarithromycin Itching   Penicillins Itching   Has patient had a PCN reaction causing immediate rash, facial/tongue/throat swelling, SOB or lightheadedness with hypotension: No Has patient had a PCN reaction causing severe rash involving mucus membranes or skin necrosis: No Has patient had a PCN reaction that required hospitalization: Unknown Has patient had a PCN reaction occurring within the last 10 years: No If all of the above answers are "NO", then may proceed with Cephalosporin use.      Medication List    STOP taking these medications   doxycycline 100 MG tablet Commonly known as:  VIBRA-TABS   furosemide 40 MG tablet Commonly known as:  LASIX   oxyCODONE-acetaminophen 5-325 MG tablet Commonly known as:  PERCOCET/ROXICET   promethazine 25 MG tablet Commonly known as:  PHENERGAN   senna 8.6 MG Tabs tablet Commonly known as:  SENOKOT     TAKE these medications   acetaminophen 325 MG tablet Commonly known as:  TYLENOL Take 650 mg by mouth every 6 (six) hours as needed for mild pain.   albuterol 108 (90 Base) MCG/ACT inhaler Commonly known as:  PROVENTIL HFA;VENTOLIN HFA Inhale 2 puffs into the lungs every 4 (four) hours as needed for wheezing or shortness of breath.   ALPRAZolam 0.5 MG tablet Commonly known as:  XANAX Take 1 tablet (0.5 mg total) by mouth 3 (three) times daily.   aspirin 81 MG tablet Take 1 tablet (81 mg total) by mouth daily. For heart health   BREO ELLIPTA 100-25 MCG/INH Aepb Generic drug:  fluticasone furoate-vilanterol Inhale 1 puff into the lungs daily.   buPROPion 300 MG 24 hr tablet Commonly known as:  WELLBUTRIN XL Take 1 tablet (300 mg total) by mouth every morning. What changed:    medication strength  how much to take  when to take this   carvedilol 25 MG tablet Commonly known as:  COREG Take 25 mg by mouth 2 (two) times daily with a meal.   cloNIDine 0.3 mg/24hr patch Commonly known as:  CATAPRES - Dosed in  mg/24 hr Place 0.3 mg onto the skin once a week.   DAILY-VITE PO Take 1 tablet by mouth daily.   dextromethorphan-guaiFENesin 30-600 MG 12hr tablet Commonly known as:  MUCINEX DM Take 1 tablet by mouth every 12 (twelve) hours as needed for cough.   divalproex 250 MG DR tablet Commonly known as:  DEPAKOTE Take 1 tablet (250 mg total) by mouth every 12 (twelve) hours.   ferrous sulfate 325 (65 FE) MG tablet Take 325 mg by mouth daily with breakfast.   fluticasone 50 MCG/ACT nasal spray Commonly known as:  FLONASE Place 2 sprays into both nostrils 2 (two) times daily.   guaifenesin 100 MG/5ML syrup Commonly known as:  ROBITUSSIN Take 200 mg by mouth 3 (three) times daily as needed for cough.   hydrALAZINE 100 MG tablet Commonly known as:  APRESOLINE Take 100 mg by mouth every 8 (eight) hours.   hydrOXYzine 25 MG tablet Commonly known as:  ATARAX/VISTARIL Take 1 tablet (25 mg total) by mouth 3 (three) times  daily as needed for anxiety.   ipratropium-albuterol 0.5-2.5 (3) MG/3ML Soln Commonly known as:  DUONEB Take 3 mLs by nebulization 3 (three) times daily as needed (SOb).   loperamide 2 MG capsule Commonly known as:  IMODIUM Take 2 mg by mouth as needed for diarrhea or loose stools.   lurasidone 80 MG Tabs tablet Commonly known as:  LATUDA Take 1 tablet (80 mg total) by mouth daily with breakfast.   magnesium hydroxide 400 MG/5ML suspension Commonly known as:  MILK OF MAGNESIA Take 30 mLs by mouth at bedtime as needed for mild constipation.   magnesium oxide 400 (241.3 Mg) MG tablet Commonly known as:  MAG-OX Take 2 tablets (800 mg total) by mouth 2 (two) times daily.   Melatonin 10 MG Tabs Take 10 mg by mouth at bedtime.   [place] 200-200-20 MG/5ML suspension Generic drug:  alum & mag hydroxide-simeth Take 30 mLs by mouth every 6 (six) hours as needed for indigestion or heartburn.   neomycin-bacitracin-polymyxin Oint Commonly known as:  NEOSPORIN Apply 1  application topically daily as needed for wound care.   NIFEdipine 90 MG 24 hr tablet Commonly known as:  PROCARDIA XL/ADALAT-CC Take 90 mg by mouth daily.   omeprazole 20 MG capsule Commonly known as:  PRILOSEC Take 1 capsule (20 mg total) by mouth 2 (two) times daily before a meal. For acid reflux. What changed:  when to take this   oxyCODONE 5 MG immediate release tablet Commonly known as:  Oxy IR/ROXICODONE Take 1 tablet (5 mg total) by mouth every 6 (six) hours as needed for severe pain.   PARoxetine 20 MG tablet Commonly known as:  PAXIL Take 1 tablet (20 mg total) by mouth daily.   polyethylene glycol packet Commonly known as:  MIRALAX Take 17 g by mouth daily as needed. What changed:    when to take this  reasons to take this   pravastatin 20 MG tablet Commonly known as:  PRAVACHOL Take 20 mg by mouth daily.   predniSONE 20 MG tablet Commonly known as:  DELTASONE Take 2 tablets (40 mg total) by mouth daily for 7 days. What changed:    medication strength  how much to take  how to take this  when to take this  additional instructions   sodium chloride 1 g tablet Take 1 g by mouth 3 (three) times daily with meals.   sucralfate 1 g tablet Commonly known as:  CARAFATE Take 1 g by mouth 4 (four) times daily.      Follow-up Information    Renato Shin, MD. Schedule an appointment as soon as possible for a visit in 1 week(s).   Specialty:  Endocrinology Why:  with repeat CBC/BMP Contact information: [address] [facility] [address] [place] [address] [phone]          Allergies  Allergen Reactions  . Clarithromycin Itching  . Penicillins Itching    Has patient had a PCN reaction causing immediate rash, facial/tongue/throat swelling, SOB or lightheadedness with hypotension: No Has patient had a PCN reaction causing severe rash involving mucus membranes or skin necrosis: No Has patient had a PCN reaction that required hospitalization:  Unknown Has patient had a PCN reaction occurring within the last 10 years: No If all of the above answers are "NO", then may proceed with Cephalosporin use.     Consultations:  Psychiatry   Procedures/Studies: Dg Chest 2 View  Result Date: 10/06/2017 CLINICAL DATA:  Short of breath EXAM: CHEST -  2 VIEW COMPARISON:  07/30/2017 FINDINGS: Cardiomegaly with mild central congestion. Subsegmental atelectasis at the left base. No pleural effusion. Aortic atherosclerosis. No pneumothorax. IMPRESSION: Cardiomegaly with mild vascular congestion. Electronically Signed   By: Donavan Foil M.D.   On: 10/06/2017 23:16   Ct Abdomen Pelvis W Contrast  Result Date: 10/06/2017 CLINICAL DATA:  Shortness of breath, productive cough, epigastric abdominal pain, nausea, urinary frequency EXAM: CT ABDOMEN AND PELVIS WITH CONTRAST TECHNIQUE: Multidetector CT imaging of the abdomen and pelvis was performed using the standard protocol following bolus administration of intravenous contrast. CONTRAST:  175mL ISOVUE-300 IOPAMIDOL (ISOVUE-300) INJECTION 61% COMPARISON:  08/03/2017 FINDINGS: Lower chest: Mild linear scarring/atelectasis in the bilateral lower lobes. The heart is top-normal in size.  Small pericardial effusion. Hepatobiliary: Liver is within normal limits. Status post cholecystectomy. No intrahepatic or extrahepatic ductal dilatation. Pancreas: Within normal limits. Spleen: Within normal limits. Adrenals/Urinary Tract: Adrenal glands are within normal limits. Scarring with calyceal diverticulum in the posterior right upper kidney (series 2/image 31). 11 mm calyceal diverticulum in the anterior interpolar right kidney (series 2/image 36). Left kidney is within normal limits. No hydronephrosis. Bladder is within normal limits. Stomach/Bowel: Stomach is within normal limits. No evidence of bowel obstruction. No colonic wall thickening or inflammatory changes. Vascular/Lymphatic: No evidence of abdominal aortic  aneurysm. Atherosclerotic calcifications of the abdominal aorta and branch vessels. No suspicious abdominopelvic lymphadenopathy. Reproductive: Uterus is within normal limits. No adnexal masses. Other: No abdominopelvic ascites. Musculoskeletal: Moderate compression fracture deformity at L2. Multiple mild superior endplate compression fracture deformities at T10-12. These are unchanged from the prior. IMPRESSION: No evidence of bowel obstruction. No CT findings to account for the patient's abdominal pain. Additional stable ancillary findings as above. Electronically Signed   By: Julian Hy M.D.   On: 10/06/2017 23:16   Dg Abd Acute W/chest  Result Date: 10/16/2017 CLINICAL DATA:  Constipation for 1 week, shortness of breath. EXAM: DG ABDOMEN ACUTE W/ 1V CHEST COMPARISON:  Chest radiograph October 06, 2017 and CT abdomen and pelvis October 06, 2017. FINDINGS: Cardiac silhouette is mildly enlarged. Mild vascular congestion. Mediastinal silhouette is nonsuspicious, calcified aortic knob. Lungs are clear, no pleural effusions. No pneumothorax. Soft tissue planes and included osseous structures are unremarkable. Bowel gas pattern is nondilated and nonobstructive. A few scattered air-fluid levels. No intra-abdominal mass effect, pathologic calcifications or free air. Severe calcific atherosclerosis. Surgical clips in the included right abdomen compatible with cholecystectomy. Soft tissue planes and included osseous structures are non-suspicious. Multiple spinal fractures. Old RIGHT pelvic fractures. IMPRESSION: Mild cardiomegaly and mild vascular congestion. Scattered air-fluid levels seen with enteritis. Aortic Atherosclerosis (ICD10-I70.0). Electronically Signed   By: Elon Alas M.D.   On: 10/16/2017 04:45    Echo Study Conclusions  - Left ventricle: The cavity size was normal. There was mild focal basal hypertrophy of the septum. Systolic function was normal. The estimated ejection fraction  was in the range of 55% to 60%. Wall motion was normal; there were no regional wall motion abnormalities. Doppler parameters are consistent with abnormal left ventricular relaxation (grade 1 diastolic dysfunction). Doppler parameters are consistent with high ventricular filling pressure. - Aortic valve: There was trivial regurgitation. - Left atrium: The atrium was mildly dilated. - Pericardium, extracardiac: A small pericardial effusion was identified.  Impressions:  - Normal LV systolic function; mild diastolic dysfunction; elevated LV filling pressure; mild LAE; small pericardial effusion.    Subjective: Patient seen and examined at bedside.  She is sleepy, wakes up slightly on  calling her name.  Poor historian.  No overnight fever, nausea or vomiting  Discharge Exam: Vitals:   10/19/17 0435 10/19/17 0832  BP: (!) 182/65   Pulse: 62   Resp: 16   Temp: 98.4 F (36.9 C)   SpO2: 98% 96%   Vitals:   10/18/17 1426 10/18/17 2028 10/19/17 0435 10/19/17 0832  BP: (!) 170/58 (!) 179/67 (!) 182/65   Pulse:  67 62   Resp: 18 19 16    Temp: 98.2 F (36.8 C) 98.5 F (36.9 C) 98.4 F (36.9 C)   TempSrc: Oral Oral Oral   SpO2: 99% 98% 98% 96%  Weight:   90.7 kg (199 lb 15.3 oz)   Height:        General: Pt is sleepy, wakes up slightly.  Poor historian.  No acute distress  cardiovascular: Rate controlled, S1/S2 + Respiratory: Bilateral decreased breath sounds at bases Abdominal: Soft, NT, ND, bowel sounds + Extremities: Trace edema, no cyanosis    The results of significant diagnostics from this hospitalization (including imaging, microbiology, ancillary and laboratory) are listed below for reference.     Microbiology: Recent Results (from the past 240 hour(s))  Gastrointestinal Panel by PCR , Stool     Status: None   Collection Time: 10/16/17  3:43 PM  Result Value Ref Range Status   Campylobacter species NOT DETECTED NOT DETECTED Final   Plesimonas  shigelloides NOT DETECTED NOT DETECTED Final   Salmonella species NOT DETECTED NOT DETECTED Final   Yersinia enterocolitica NOT DETECTED NOT DETECTED Final   Vibrio species NOT DETECTED NOT DETECTED Final   Vibrio cholerae NOT DETECTED NOT DETECTED Final   Enteroaggregative E coli (EAEC) NOT DETECTED NOT DETECTED Final   Enteropathogenic E coli (EPEC) NOT DETECTED NOT DETECTED Final   Enterotoxigenic E coli (ETEC) NOT DETECTED NOT DETECTED Final   Shiga like toxin producing E coli (STEC) NOT DETECTED NOT DETECTED Final   Shigella/Enteroinvasive E coli (EIEC) NOT DETECTED NOT DETECTED Final   Cryptosporidium NOT DETECTED NOT DETECTED Final   Cyclospora cayetanensis NOT DETECTED NOT DETECTED Final   Entamoeba histolytica NOT DETECTED NOT DETECTED Final   Giardia lamblia NOT DETECTED NOT DETECTED Final   Adenovirus F40/41 NOT DETECTED NOT DETECTED Final   Astrovirus NOT DETECTED NOT DETECTED Final   Norovirus GI/GII NOT DETECTED NOT DETECTED Final   Rotavirus A NOT DETECTED NOT DETECTED Final   Sapovirus (I, II, IV, and V) NOT DETECTED NOT DETECTED Final    Comment: Performed at [facility], [address] [place], [place] [address]     Labs: BNP (last 3 results) Recent Labs    07/31/17 0346 10/06/17 2047 10/16/17 0355  BNP 88.1 87.6 409.7*   Basic Metabolic Panel: Recent Labs  Lab 10/16/17 0355 10/17/17 0551 10/18/17 0602 10/19/17 0511  NA 113* 128* 131* 128*  K 3.9 3.9 4.5 4.6  CL 77* 91* 94* 91*  CO2 27 25 28 28   GLUCOSE 114* 95 87 161*  BUN 5* 7 5* 8  CREATININE 0.34* 0.40* 0.45 0.41*  CALCIUM 8.3* 8.6* 8.7* 9.0  MG  --   --   --  1.7   Liver Function Tests: Recent Labs  Lab 10/16/17 0355 10/17/17 0551 10/19/17 0511  AST 13* 11* 11*  ALT 16 14 13*  ALKPHOS 61 55 60  BILITOT 0.5 0.3 0.2*  PROT 5.7* 5.9* 6.0*  ALBUMIN 3.2* 3.1* 3.1*   Recent Labs  Lab 10/16/17 0355  LIPASE 27  No results for input(s): AMMONIA in the last 168  hours. CBC: Recent Labs  Lab 10/16/17 0355 10/17/17 0551 10/19/17 0511  WBC 8.5 6.7 8.3  NEUTROABS 5.7  --  7.7  HGB 11.7* 11.3* 11.6*  HCT 34.1* 33.9* 35.8*  MCV 84.8 88.7 90.4  PLT 203 194 241   Cardiac Enzymes: No results for input(s): CKTOTAL, CKMB, CKMBINDEX, TROPONINI in the last 168 hours. BNP: Invalid input(s): POCBNP CBG: No results for input(s): GLUCAP in the last 168 hours. D-Dimer No results for input(s): DDIMER in the last 72 hours. Hgb A1c No results for input(s): HGBA1C in the last 72 hours. Lipid Profile No results for input(s): CHOL, HDL, LDLCALC, TRIG, CHOLHDL, LDLDIRECT in the last 72 hours. Thyroid function studies No results for input(s): TSH, T4TOTAL, T3FREE, THYROIDAB in the last 72 hours.  Invalid input(s): FREET3 Anemia work up No results for input(s): VITAMINB12, FOLATE, FERRITIN, TIBC, IRON, RETICCTPCT in the last 72 hours. Urinalysis    Component Value Date/Time   COLORURINE STRAW (A) 10/07/2017 0036   APPEARANCEUR CLEAR 10/07/2017 0036   APPEARANCEUR Clear 06/22/2012 0618   LABSPEC 1.008 10/07/2017 0036   LABSPEC 1.003 06/22/2012 0618   PHURINE 9.0 (H) 10/07/2017 0036   GLUCOSEU NEGATIVE 10/07/2017 0036   GLUCOSEU Negative 06/22/2012 0618   GLUCOSEU NEGATIVE 04/09/2011 1058   HGBUR NEGATIVE 10/07/2017 0036   BILIRUBINUR NEGATIVE 10/07/2017 0036   BILIRUBINUR Negative 06/22/2012 0618   KETONESUR NEGATIVE 10/07/2017 0036   PROTEINUR NEGATIVE 10/07/2017 0036   UROBILINOGEN 1.0 08/18/2011 1805   NITRITE NEGATIVE 10/07/2017 0036   LEUKOCYTESUR NEGATIVE 10/07/2017 0036   LEUKOCYTESUR Trace 06/22/2012 0618   Sepsis Labs Invalid input(s): PROCALCITONIN,  WBC,  LACTICIDVEN Microbiology Recent Results (from the past 240 hour(s))  Gastrointestinal Panel by PCR , Stool     Status: None   Collection Time: 10/16/17  3:43 PM  Result Value Ref Range Status   Campylobacter species NOT DETECTED NOT DETECTED Final   Plesimonas shigelloides NOT  DETECTED NOT DETECTED Final   Salmonella species NOT DETECTED NOT DETECTED Final   Yersinia enterocolitica NOT DETECTED NOT DETECTED Final   Vibrio species NOT DETECTED NOT DETECTED Final   Vibrio cholerae NOT DETECTED NOT DETECTED Final   Enteroaggregative E coli (EAEC) NOT DETECTED NOT DETECTED Final   Enteropathogenic E coli (EPEC) NOT DETECTED NOT DETECTED Final   Enterotoxigenic E coli (ETEC) NOT DETECTED NOT DETECTED Final   Shiga like toxin producing E coli (STEC) NOT DETECTED NOT DETECTED Final   Shigella/Enteroinvasive E coli (EIEC) NOT DETECTED NOT DETECTED Final   Cryptosporidium NOT DETECTED NOT DETECTED Final   Cyclospora cayetanensis NOT DETECTED NOT DETECTED Final   Entamoeba histolytica NOT DETECTED NOT DETECTED Final   Giardia lamblia NOT DETECTED NOT DETECTED Final   Adenovirus F40/41 NOT DETECTED NOT DETECTED Final   Astrovirus NOT DETECTED NOT DETECTED Final   Norovirus GI/GII NOT DETECTED NOT DETECTED Final   Rotavirus A NOT DETECTED NOT DETECTED Final   Sapovirus (I, II, IV, and V) NOT DETECTED NOT DETECTED Final    Comment: Performed at [facility], [address] [place], [place] [address]     Time coordinating discharge: 35 minutes  SIGNED:   Aline August, MD  [facility] 10/19/2017, 10:34 AM Pager: [phone]  If 7PM-7AM, please contact night-coverage [email or web address] Password [email or web address]

## 2017-10-19 NOTE — Clinical Social Work Placement (Signed)
Patient returning to [facility]. Facility aware of patient's discharge and confirmed patient's ability to return. PTAR contacted, [facility] DSS APS notified, Patient's RN can call report to [phone], packet complete. CSW signing off, no other needs identified at this time.  CLINICAL SOCIAL WORK PLACEMENT  NOTE  Date:  10/19/2017  Patient Details  Name: Natalie Thomas MRN: [id number] Date of Birth: 24-Nov-1960  Clinical Social Work is seeking post-discharge placement for this patient at the [place] level of care (*CSW will initial, date and re-position this form in  chart as items are completed):  Yes   Patient/family provided with [place] Work Department's list of facilities offering this level of care within the geographic area requested by the patient (or if unable, by the patient's family).  Yes   Patient/family informed of their freedom to choose among providers that offer the needed level of care, that participate in Medicare, Medicaid or managed care program needed by the patient, have an available bed and are willing to accept the patient.  Yes   Patient/family informed of [facility]'s ownership interest in [facility] and [facility], as well as of the fact that they are under no obligation to receive care at these facilities.  PASRR submitted to EDS on       PASRR number received on       Existing PASRR number confirmed on       FL2 transmitted to all facilities in geographic area requested by pt/family on 10/19/17     FL2 transmitted to all facilities within larger geographic area on       Patient informed that his/her managed care company has contracts with or will negotiate with certain facilities, including the following:        (Patient is a [facility] term care resident at [facility])   Patient/family informed of bed offers received.  Patient chooses bed at [facility]     Physician recommends and patient  chooses bed at      Patient to be transferred to [facility] on 10/19/17.  Patient to be transferred to facility by PTAR     Patient family notified on 10/19/17 of transfer.  Name of family member notified:  Adria Devon [facility] DSS APS SW)     PHYSICIAN       Additional Comment:    _______________________________________________ Burnis Medin, LCSW 10/19/2017, 2:08 PM

## 2017-11-04 ENCOUNTER — Emergency Department (HOSPITAL_COMMUNITY)
Admission: EM | Admit: 2017-11-04 | Discharge: 2017-11-05 | Disposition: A | Discharge status: Home / Self Care | Payer: Medicare Other | Source: Home / Self Care | Attending: Emergency Medicine | Admitting: Emergency Medicine

## 2017-11-04 ENCOUNTER — Emergency Department (HOSPITAL_COMMUNITY): Payer: Medicare Other

## 2017-11-04 ENCOUNTER — Encounter (HOSPITAL_COMMUNITY): Payer: Self-pay

## 2017-11-04 DIAGNOSIS — E119 Type 2 diabetes mellitus without complications: Secondary | ICD-10-CM

## 2017-11-04 DIAGNOSIS — Z79899 Other long term (current) drug therapy: Secondary | ICD-10-CM

## 2017-11-04 DIAGNOSIS — Z8544 Personal history of malignant neoplasm of other female genital organs: Secondary | ICD-10-CM | POA: Insufficient documentation

## 2017-11-04 DIAGNOSIS — S72141A Displaced intertrochanteric fracture of right femur, initial encounter for closed fracture: Secondary | ICD-10-CM | POA: Diagnosis not present

## 2017-11-04 DIAGNOSIS — Z7984 Long term (current) use of oral hypoglycemic drugs: Secondary | ICD-10-CM | POA: Insufficient documentation

## 2017-11-04 DIAGNOSIS — Z7982 Long term (current) use of aspirin: Secondary | ICD-10-CM | POA: Insufficient documentation

## 2017-11-04 DIAGNOSIS — J449 Chronic obstructive pulmonary disease, unspecified: Secondary | ICD-10-CM | POA: Insufficient documentation

## 2017-11-04 DIAGNOSIS — F17210 Nicotine dependence, cigarettes, uncomplicated: Secondary | ICD-10-CM

## 2017-11-04 DIAGNOSIS — R0600 Dyspnea, unspecified: Secondary | ICD-10-CM

## 2017-11-04 DIAGNOSIS — E871 Hypo-osmolality and hyponatremia: Secondary | ICD-10-CM | POA: Diagnosis not present

## 2017-11-04 DIAGNOSIS — R109 Unspecified abdominal pain: Secondary | ICD-10-CM

## 2017-11-04 DIAGNOSIS — I110 Hypertensive heart disease with heart failure: Secondary | ICD-10-CM | POA: Insufficient documentation

## 2017-11-04 DIAGNOSIS — I509 Heart failure, unspecified: Secondary | ICD-10-CM | POA: Insufficient documentation

## 2017-11-04 LAB — CBC WITH DIFFERENTIAL/PLATELET
BASOS ABS: 0 10*3/uL (ref 0.0–0.1)
Basophils Relative: 0 %
EOS ABS: 0.1 10*3/uL (ref 0.0–0.7)
EOS PCT: 1 %
HCT: 38.1 % (ref 36.0–46.0)
Hemoglobin: 12.1 g/dL (ref 12.0–15.0)
LYMPHS ABS: 1.8 10*3/uL (ref 0.7–4.0)
LYMPHS PCT: 29 %
MCH: 28.7 pg (ref 26.0–34.0)
MCHC: 31.8 g/dL (ref 30.0–36.0)
MCV: 90.3 fL (ref 78.0–100.0)
MONO ABS: 0.6 10*3/uL (ref 0.1–1.0)
Monocytes Relative: 9 %
Neutro Abs: 3.8 10*3/uL (ref 1.7–7.7)
Neutrophils Relative %: 61 %
PLATELETS: 181 10*3/uL (ref 150–400)
RBC: 4.22 MIL/uL (ref 3.87–5.11)
RDW: 14.6 % (ref 11.5–15.5)
WBC: 6.3 10*3/uL (ref 4.0–10.5)

## 2017-11-04 LAB — COMPREHENSIVE METABOLIC PANEL
ALBUMIN: 3.4 g/dL — AB (ref 3.5–5.0)
ALK PHOS: 73 U/L (ref 38–126)
ALT: 15 U/L (ref 14–54)
AST: 16 U/L (ref 15–41)
Anion gap: 9 (ref 5–15)
BILIRUBIN TOTAL: 0.1 mg/dL — AB (ref 0.3–1.2)
BUN: 7 mg/dL (ref 6–20)
CALCIUM: 8.8 mg/dL — AB (ref 8.9–10.3)
CO2: 30 mmol/L (ref 22–32)
Chloride: 94 mmol/L — ABNORMAL LOW (ref 101–111)
Creatinine, Ser: 0.51 mg/dL (ref 0.44–1.00)
GFR calc Af Amer: >60 mL/min (ref >60)
GFR calc non Af Amer: >60 mL/min (ref >60)
Glucose, Bld: 104 mg/dL — ABNORMAL HIGH (ref 65–99)
Potassium: 4.2 mmol/L (ref 3.5–5.1)
SODIUM: 133 mmol/L — AB (ref 135–145)
TOTAL PROTEIN: 6.7 g/dL (ref 6.5–8.1)

## 2017-11-04 LAB — I-STAT CHEM 8, ED
BUN: 5 mg/dL — ABNORMAL LOW (ref 6–20)
CALCIUM ION: 1.1 mmol/L — AB (ref 1.15–1.40)
CREATININE: 0.6 mg/dL (ref 0.44–1.00)
Chloride: 91 mmol/L — ABNORMAL LOW (ref 101–111)
GLUCOSE: 105 mg/dL — AB (ref 65–99)
HCT: 38 % (ref 36.0–46.0)
HEMOGLOBIN: 12.9 g/dL (ref 12.0–15.0)
POTASSIUM: 4.1 mmol/L (ref 3.5–5.1)
Sodium: 131 mmol/L — ABNORMAL LOW (ref 135–145)
TCO2: 32 mmol/L (ref 22–32)

## 2017-11-04 LAB — URINALYSIS, ROUTINE W REFLEX MICROSCOPIC
BILIRUBIN URINE: NEGATIVE
GLUCOSE, UA: NEGATIVE mg/dL
HGB URINE DIPSTICK: NEGATIVE
KETONES UR: NEGATIVE mg/dL
Leukocytes, UA: NEGATIVE
Nitrite: NEGATIVE
PH: 7 (ref 5.0–8.0)
PROTEIN: NEGATIVE mg/dL
Specific Gravity, Urine: 1.002 — ABNORMAL LOW (ref 1.005–1.030)

## 2017-11-04 LAB — I-STAT TROPONIN, ED: Troponin i, poc: 0.02 ng/mL (ref 0.00–0.08)

## 2017-11-04 LAB — LIPASE, BLOOD: LIPASE: 26 U/L (ref 11–51)

## 2017-11-04 LAB — BRAIN NATRIURETIC PEPTIDE: B Natriuretic Peptide: 55.6 pg/mL (ref 0.0–100.0)

## 2017-11-04 MED ORDER — TRAZODONE HCL 50 MG PO TABS
50.0000 mg | ORAL_TABLET | Freq: Every day | ORAL | Status: DC
  Administered 2017-11-04: 50 mg via ORAL
  Filled 2017-11-04: qty 1

## 2017-11-04 MED ORDER — DICYCLOMINE HCL 20 MG PO TABS: 20.0000 mg | ORAL_TABLET | Freq: Two times a day (BID) | ORAL | 0 refills | Status: DC

## 2017-11-04 MED ORDER — GI COCKTAIL ~~LOC~~
30.0000 mL | Freq: Once | ORAL | Status: AC
  Administered 2017-11-04: 30 mL via ORAL
  Filled 2017-11-04: qty 30

## 2017-11-04 MED ORDER — ALPRAZOLAM 0.5 MG PO TABS
0.5000 mg | ORAL_TABLET | Freq: Once | ORAL | Status: AC
  Administered 2017-11-04: 0.5 mg via ORAL
  Filled 2017-11-04: qty 1

## 2017-11-04 MED ORDER — METOCLOPRAMIDE HCL 5 MG/ML IJ SOLN
10.0000 mg | Freq: Once | INTRAMUSCULAR | Status: AC
  Administered 2017-11-04: 10 mg via INTRAVENOUS
  Filled 2017-11-04: qty 2

## 2017-11-04 NOTE — ED Notes (Signed)
Pt also received 125mg  of solumedrol per EMS

## 2017-11-04 NOTE — ED Provider Notes (Signed)
[facility] DEPT Provider Note   CSN: [id number] Arrival date & time: 11/04/17  1910     History   Chief Complaint Chief Complaint  Patient presents with  . Abdominal Pain  . Shortness of Breath    HPI Natalie Thomas is a 57 y.o. female who presents with abdominal pain and SOB. PMH significant for hypertension, diabetes mellitus type 2, grade 1 CHF, COPD on 2 L nasal cannula, GERD, schizoaffective d/o. She lives at [facility]. She states her symptoms have been going on for about 1 month, maybe longer. She is a poor historian. She states the pain is over her entire abdomen. She reports associated nausea. She also has SOB, cough, wheezing. She was admitted from 4/14-4/16 for a COPD exacerbation and from 4/24-4/27 for hyponatremia and enteritis. Her Na improved. Her GI pathogen panel was negative. CT of the abdomen on 4/14 was negative. She feels constipated stating she hasn't had a BM in a week. She is on narcotics chronically. She states that she feels very weak because she can't eat and it's painful. She has not tried Miralax.   HPI  Past Medical History:  Diagnosis Date  . CHF (congestive heart failure) ([place])   . COPD (chronic obstructive pulmonary disease) ([place])   . Depression   . Diabetes mellitus, type 2 ([place])    pt denies but states that she has been treated for DM  . GERD (gastroesophageal reflux disease)   . Hypertension   . Osteoporosis     Patient Active Problem List   Diagnosis Date Noted  . Anxiety and depression   . Dyspnea 10/16/2017  . Anemia 10/16/2017  . COPD exacerbation ([place]) 10/07/2017  . Hyperlipidemia 10/07/2017  . Chronic respiratory failure with hypoxia ([place]) 10/07/2017  . Acute on chronic respiratory failure with hypoxia and hypercapnia ([place]) 10/07/2017  . Incontinence of urine 08/14/2011  . Hyponatremia 07/27/2011  . Schizoaffective disorder, bipolar type ([place]) 07/05/2011  . Cough 05/08/2011  .  Encounter for long-term (current) use of other medications 04/09/2011  . Lumbar disc disease 04/09/2011  . Polycythemia secondary to smoking 04/09/2011  . HIP PAIN, LEFT 12/12/2007  . 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 DIABETES MELLITUS, TYPE II 04/08/2007  . MENOPAUSE, PREMATURE 04/08/2007  . Chronic hyponatremia 04/08/2007  . SCHIZOAFFECTIVE DISORDER 04/08/2007  . DEPRESSION 04/08/2007  . Essential hypertension 04/08/2007  . COPD 04/08/2007  . GERD 04/08/2007  . OSTEOPOROSIS 04/08/2007  . DYSURIA 04/08/2007    Past Surgical History:  Procedure Laterality Date  . EYE SURGERY     on left  eye, pt states that she sees bad out of the right eye and needs surgery there  . PELVIC FRACTURE SURGERY       OB History   None      Home Medications    Prior to Admission medications   Medication Sig Start Date End Date Taking? Authorizing Provider  acetaminophen (TYLENOL) 325 MG tablet Take 650 mg by mouth every 6 (six) hours as needed for mild pain.    [provider]  albuterol (PROVENTIL HFA;VENTOLIN HFA) 108 (90 Base) MCG/ACT inhaler Inhale 2 puffs into the lungs every 4 (four) hours as needed for wheezing or shortness of breath. 07/05/17   Ward, Delice Bison, DO  ALPRAZolam Duanne Moron) 0.5 MG tablet Take 1 tablet (0.5 mg total) by mouth 3 (three) times daily. 10/19/17   Aline August, MD  alum & mag hydroxide-simeth ([place]) 200-200-20 MG/5ML suspension Take 30 mLs  by mouth every 6 (six) hours as needed for indigestion or heartburn.    [provider]  aspirin 81 MG tablet Take 1 tablet (81 mg total) by mouth daily. For heart health 08/06/11   Greig Castilla, FNP  buPROPion (WELLBUTRIN XL) 300 MG 24 hr tablet Take 1 tablet (300 mg total) by mouth every morning. 10/19/17 10/19/18  Aline August, MD  carvedilol (COREG) 25 MG tablet Take 25 mg by mouth 2 (two) times daily with a meal.    [provider]  cloNIDine (CATAPRES - DOSED IN MG/24 HR) 0.3 mg/24hr  patch Place 0.3 mg onto the skin once a week.    [provider]  dextromethorphan-guaiFENesin (MUCINEX DM) 30-600 MG 12hr tablet Take 1 tablet by mouth every 12 (twelve) hours as needed for cough.    [provider]  divalproex (DEPAKOTE) 250 MG DR tablet Take 1 tablet (250 mg total) by mouth every 12 (twelve) hours. 08/12/17   Nita Sells, MD  ferrous sulfate 325 (65 FE) MG tablet Take 325 mg by mouth daily with breakfast.    [provider]  fluticasone (FLONASE) 50 MCG/ACT nasal spray Place 2 sprays into both nostrils 2 (two) times daily.    [provider]  fluticasone furoate-vilanterol (BREO ELLIPTA) 100-25 MCG/INH AEPB Inhale 1 puff into the lungs daily.    [provider]  guaifenesin (ROBITUSSIN) 100 MG/5ML syrup Take 200 mg by mouth 3 (three) times daily as needed for cough.    [provider]  hydrALAZINE (APRESOLINE) 100 MG tablet Take 100 mg by mouth every 8 (eight) hours.    [provider]  hydrOXYzine (ATARAX/VISTARIL) 25 MG tablet Take 1 tablet (25 mg total) by mouth 3 (three) times daily as needed for anxiety. 10/19/17   Aline August, MD  ipratropium-albuterol (DUONEB) 0.5-2.5 (3) MG/3ML SOLN Take 3 mLs by nebulization 3 (three) times daily as needed (SO[place]).    [provider]  loperamide (IMODIUM) 2 MG capsule Take 2 mg by mouth as needed for diarrhea or loose stools.    [provider]  lurasidone (LATUDA) 80 MG TABS tablet Take 1 tablet (80 mg total) by mouth daily with breakfast. 08/12/17   Nita Sells, MD  magnesium hydroxide (MILK OF MAGNESIA) 400 MG/5ML suspension Take 30 mLs by mouth at bedtime as needed for mild constipation.    [provider]  magnesium oxide (MAG-OX) 400 (241.3 Mg) MG tablet Take 2 tablets (800 mg total) by mouth 2 (two) times daily. 08/12/17   Nita Sells, MD  Melatonin 10 MG TABS Take 10 mg by mouth at bedtime.    [provider]    Multiple Vitamin (DAILY-VITE PO) Take 1 tablet by mouth daily.    [provider]  neomycin-bacitracin-polymyxin (NEOSPORIN) OINT Apply 1 application topically daily as needed for wound care.    [provider]  NIFEdipine (PROCARDIA XL/ADALAT-CC) 90 MG 24 hr tablet Take 90 mg by mouth daily.    [provider]  omeprazole (PRILOSEC) 20 MG capsule Take 1 capsule (20 mg total) by mouth 2 (two) times daily before a meal. For acid reflux. 10/19/17   Aline August, MD  oxyCODONE (OXY IR/ROXICODONE) 5 MG immediate release tablet Take 1 tablet (5 mg total) by mouth every 6 (six) hours as needed for severe pain. 10/19/17   Aline August, MD  PARoxetine (PAXIL) 20 MG tablet Take 1 tablet (20 mg total) by mouth daily. 10/19/17   Aline August, MD  polyethylene  glycol (MIRALAX) packet Take 17 g by mouth daily as needed. 10/19/17   Aline August, MD  pravastatin (PRAVACHOL) 20 MG tablet Take 20 mg by mouth daily.    [provider]  sodium chloride 1 g tablet Take 1 g by mouth 3 (three) times daily with meals.     [provider]  sucralfate (CARAFATE) 1 g tablet Take 1 g by mouth 4 (four) times daily.    [provider]    Family History History reviewed. No pertinent family history.  Social History Social History   Tobacco Use  . Smoking status: Current Every Day Smoker    Packs/day: 0.25    Types: Cigarettes  . Smokeless tobacco: Never Used  Substance Use Topics  . Alcohol use: No  . Drug use: No     Allergies   Clarithromycin and Penicillins   Review of Systems Review of Systems  Constitutional: Negative for fever.  Respiratory: Positive for cough, shortness of breath and wheezing.   Cardiovascular: Negative for chest pain.  Gastrointestinal: Positive for abdominal pain, constipation, nausea and vomiting. Negative for diarrhea.  Genitourinary: Negative for dysuria.  All other systems reviewed and are negative.    Physical  Exam Updated Vital Signs BP (!) 177/62   Pulse 70   Temp 97.7 F (36.5 C) (Oral)   Resp 15   SpO2 92%   Physical Exam  Constitutional: She is oriented to person, place, and time. She appears well-developed and well-nourished. No distress.  Chronically ill appearing. On 2L O2  HENT:  Head: Normocephalic and atraumatic.  Eyes: Pupils are equal, round, and reactive to light. Conjunctivae are normal. Right eye exhibits no discharge. Left eye exhibits no discharge. No scleral icterus.  Neck: Normal range of motion.  Cardiovascular: Normal rate and regular rhythm.  Pulmonary/Chest: Effort normal. No respiratory distress.  Scattered rhonchi  Abdominal: Soft. Bowel sounds are normal. She exhibits no distension. There is tenderness (generalized, moderate).  Neurological: She is alert and oriented to person, place, and time.  Skin: Skin is warm and dry.  Psychiatric: Her behavior is normal. Her mood appears anxious.  Nursing note and vitals reviewed.    ED Treatments / Results  Labs (all labs ordered are listed, but only abnormal results are displayed) Labs Reviewed  COMPREHENSIVE METABOLIC PANEL - Abnormal; Notable for the following components:      Result Value   Sodium 133 (*)    Chloride 94 (*)    Glucose, Bld 104 (*)    Calcium 8.8 (*)    Albumin 3.4 (*)    Total Bilirubin 0.1 (*)    All other components within normal limits  URINALYSIS, ROUTINE W REFLEX MICROSCOPIC - Abnormal; Notable for the following components:   Color, Urine STRAW (*)    Specific Gravity, Urine 1.002 (*)    All other components within normal limits  I-STAT CHEM 8, ED - Abnormal; Notable for the following components:   Sodium 131 (*)    Chloride 91 (*)    BUN 5 (*)    Glucose, Bld 105 (*)    Calcium, Ion 1.10 (*)    All other components within normal limits  BRAIN NATRIURETIC PEPTIDE  CBC WITH DIFFERENTIAL/PLATELET  LIPASE, BLOOD  I-STAT TROPONIN, ED    EKG EKG  Interpretation  Date/Time:  Monday Nov 04 2017 19:30:40 EDT Ventricular Rate:  73 PR Interval:    QRS Duration: 86 QT Interval:  395 QTC Calculation: 436 R Axis:   58  Text Interpretation:  Confirmed by Julianne Rice [phone]) on 11/04/2017 7:36:55 PM   Radiology Dg Chest 2 View  Result Date: 11/04/2017 CLINICAL DATA:  Abdominal pain for a month, difficulty breathing, wheezing. EXAM: CHEST - 2 VIEW COMPARISON:  Chest x-ray dated 10/16/2017. Chest x-ray dated 06/22/2012. FINDINGS: Stable cardiomegaly. Lungs are clear. No pleural effusion or pneumothorax seen. Osseous structures about the chest are unremarkable. IMPRESSION: No active cardiopulmonary disease. No evidence of pneumonia or pulmonary edema. Electronically Signed   By: Franki Cabot M.D.   On: 11/04/2017 20:30    Procedures Procedures (including critical care time)  Medications Ordered in ED Medications  gi cocktail (Maalox,Lidocaine,Donnatal) (has no administration in time range)  metoCLOPramide (REGLAN) injection 10 mg (10 mg Intravenous Given 11/04/17 2004)     Initial Impression / Assessment and Plan / ED Course  I have reviewed the triage vital signs and the nursing notes.  Pertinent labs & imaging results that were available during my care of the patient were reviewed by me and considered in my medical decision making (see chart for details).  57 year old female with abdominal pain and SOB. It has been going on a month. She's been admitted twice in this time frame and had an extensive work up. She is a difficult historian but her main complaint is her abdominal pain. She is not significantly SOB and not hypoxic on 2L of O2. Will initiate labs and obtain CXR.   Labs are overall unremarkable. CXR is negative. BNP and Troponin are normal. She is very upset that we are not doing imaging of her abdomen. Will obtain abdominal xray but in light of her recent CT and admission I do not feel that she needs another  CT.  Abdominal xray is negative for obstruction. She was advised to take Miralax and Bentyl. She is upset but verbalized understanding.  Final Clinical Impressions(s) / ED Diagnoses   Final diagnoses:  Abdominal pain  Dyspnea, unspecified type    ED Discharge Orders    None       Iris Pert 11/05/17 2109    Julianne Rice, MD 11/10/17 [phone]

## 2017-11-04 NOTE — ED Triage Notes (Signed)
Pt also complains of trouble breathing and has wheezing throughout Pt is receiving her second duoneb currently

## 2017-11-04 NOTE — Discharge Instructions (Addendum)
Take Miralax and Bentyl for stomach pain:  Starting tomorrow, take 6 capfuls of MiraLAX in a 32 oz bottle of Gatorade over 2-4 hour period.  On day 2, take 3 capfuls, three times a day.  On day 3, take 1 capful 3 times a day. Slowly cut back as needed until you have normal bowel movements.  Follow up with [facility]

## 2017-11-04 NOTE — ED Notes (Signed)
Report called to the facility and [facility] called for transportation

## 2017-11-04 NOTE — ED Triage Notes (Signed)
Pt complains of abd pain for one month and has been seen here for the same EMS reports that pt is very anxious

## 2017-11-06 ENCOUNTER — Telehealth: Payer: Self-pay | Admitting: Internal Medicine

## 2017-11-06 NOTE — Telephone Encounter (Signed)
Pt is a resident at a nursing facility. Please advise regarding scheduling. Referral in inbox for review.

## 2017-11-06 NOTE — Telephone Encounter (Signed)
Routine new patient appointment with [facility]

## 2017-11-07 ENCOUNTER — Encounter (HOSPITAL_COMMUNITY): Payer: Self-pay | Admitting: *Deleted

## 2017-11-07 ENCOUNTER — Other Ambulatory Visit: Payer: Self-pay

## 2017-11-07 ENCOUNTER — Inpatient Hospital Stay (HOSPITAL_COMMUNITY)
Admission: EM | Admit: 2017-11-07 | Discharge: 2017-11-20 | DRG: 956 | Disposition: A | Discharge status: Skilled Nursing Facility | Payer: Medicare Other | Attending: Internal Medicine | Admitting: Internal Medicine

## 2017-11-07 ENCOUNTER — Emergency Department (HOSPITAL_COMMUNITY): Payer: Medicare Other

## 2017-11-07 DIAGNOSIS — S72001A Fracture of unspecified part of neck of right femur, initial encounter for closed fracture: Secondary | ICD-10-CM | POA: Diagnosis not present

## 2017-11-07 DIAGNOSIS — I110 Hypertensive heart disease with heart failure: Secondary | ICD-10-CM | POA: Diagnosis present

## 2017-11-07 DIAGNOSIS — F329 Major depressive disorder, single episode, unspecified: Secondary | ICD-10-CM | POA: Diagnosis present

## 2017-11-07 DIAGNOSIS — N179 Acute kidney failure, unspecified: Secondary | ICD-10-CM | POA: Diagnosis present

## 2017-11-07 DIAGNOSIS — Y95 Nosocomial condition: Secondary | ICD-10-CM | POA: Diagnosis present

## 2017-11-07 DIAGNOSIS — J9600 Acute respiratory failure, unspecified whether with hypoxia or hypercapnia: Secondary | ICD-10-CM

## 2017-11-07 DIAGNOSIS — Z880 Allergy status to penicillin: Secondary | ICD-10-CM

## 2017-11-07 DIAGNOSIS — F17210 Nicotine dependence, cigarettes, uncomplicated: Secondary | ICD-10-CM | POA: Diagnosis present

## 2017-11-07 DIAGNOSIS — Z7951 Long term (current) use of inhaled steroids: Secondary | ICD-10-CM

## 2017-11-07 DIAGNOSIS — M79609 Pain in unspecified limb: Secondary | ICD-10-CM | POA: Diagnosis not present

## 2017-11-07 DIAGNOSIS — J449 Chronic obstructive pulmonary disease, unspecified: Secondary | ICD-10-CM | POA: Diagnosis present

## 2017-11-07 DIAGNOSIS — J181 Lobar pneumonia, unspecified organism: Secondary | ICD-10-CM | POA: Diagnosis not present

## 2017-11-07 DIAGNOSIS — J9611 Chronic respiratory failure with hypoxia: Secondary | ICD-10-CM | POA: Diagnosis present

## 2017-11-07 DIAGNOSIS — K219 Gastro-esophageal reflux disease without esophagitis: Secondary | ICD-10-CM | POA: Diagnosis present

## 2017-11-07 DIAGNOSIS — Z881 Allergy status to other antibiotic agents status: Secondary | ICD-10-CM

## 2017-11-07 DIAGNOSIS — G9340 Encephalopathy, unspecified: Secondary | ICD-10-CM | POA: Diagnosis present

## 2017-11-07 DIAGNOSIS — Z9289 Personal history of other medical treatment: Secondary | ICD-10-CM

## 2017-11-07 DIAGNOSIS — E861 Hypovolemia: Secondary | ICD-10-CM | POA: Diagnosis present

## 2017-11-07 DIAGNOSIS — Z8781 Personal history of (healed) traumatic fracture: Secondary | ICD-10-CM

## 2017-11-07 DIAGNOSIS — R32 Unspecified urinary incontinence: Secondary | ICD-10-CM | POA: Diagnosis present

## 2017-11-07 DIAGNOSIS — J810 Acute pulmonary edema: Secondary | ICD-10-CM | POA: Diagnosis not present

## 2017-11-07 DIAGNOSIS — Z79899 Other long term (current) drug therapy: Secondary | ICD-10-CM

## 2017-11-07 DIAGNOSIS — E785 Hyperlipidemia, unspecified: Secondary | ICD-10-CM | POA: Diagnosis not present

## 2017-11-07 DIAGNOSIS — R0603 Acute respiratory distress: Secondary | ICD-10-CM

## 2017-11-07 DIAGNOSIS — J440 Chronic obstructive pulmonary disease with acute lower respiratory infection: Secondary | ICD-10-CM | POA: Diagnosis present

## 2017-11-07 DIAGNOSIS — E873 Alkalosis: Secondary | ICD-10-CM | POA: Diagnosis present

## 2017-11-07 DIAGNOSIS — E871 Hypo-osmolality and hyponatremia: Secondary | ICD-10-CM | POA: Diagnosis not present

## 2017-11-07 DIAGNOSIS — T17890A Other foreign object in other parts of respiratory tract causing asphyxiation, initial encounter: Secondary | ICD-10-CM | POA: Diagnosis present

## 2017-11-07 DIAGNOSIS — F250 Schizoaffective disorder, bipolar type: Secondary | ICD-10-CM | POA: Diagnosis present

## 2017-11-07 DIAGNOSIS — S32599A Other specified fracture of unspecified pubis, initial encounter for closed fracture: Secondary | ICD-10-CM | POA: Diagnosis present

## 2017-11-07 DIAGNOSIS — J9621 Acute and chronic respiratory failure with hypoxia: Secondary | ICD-10-CM | POA: Diagnosis present

## 2017-11-07 DIAGNOSIS — T148XXA Other injury of unspecified body region, initial encounter: Secondary | ICD-10-CM

## 2017-11-07 DIAGNOSIS — D751 Secondary polycythemia: Secondary | ICD-10-CM | POA: Diagnosis present

## 2017-11-07 DIAGNOSIS — D62 Acute posthemorrhagic anemia: Secondary | ICD-10-CM | POA: Clinically undetermined

## 2017-11-07 DIAGNOSIS — I5033 Acute on chronic diastolic (congestive) heart failure: Secondary | ICD-10-CM | POA: Diagnosis present

## 2017-11-07 DIAGNOSIS — S728X1A Other fracture of right femur, initial encounter for closed fracture: Secondary | ICD-10-CM

## 2017-11-07 DIAGNOSIS — I10 Essential (primary) hypertension: Secondary | ICD-10-CM | POA: Diagnosis present

## 2017-11-07 DIAGNOSIS — Z9981 Dependence on supplemental oxygen: Secondary | ICD-10-CM

## 2017-11-07 DIAGNOSIS — Z6835 Body mass index (BMI) 35.0-35.9, adult: Secondary | ICD-10-CM

## 2017-11-07 DIAGNOSIS — J9601 Acute respiratory failure with hypoxia: Secondary | ICD-10-CM

## 2017-11-07 DIAGNOSIS — E875 Hyperkalemia: Secondary | ICD-10-CM | POA: Diagnosis not present

## 2017-11-07 DIAGNOSIS — W06XXXA Fall from bed, initial encounter: Secondary | ICD-10-CM | POA: Diagnosis present

## 2017-11-07 DIAGNOSIS — D649 Anemia, unspecified: Secondary | ICD-10-CM | POA: Diagnosis not present

## 2017-11-07 DIAGNOSIS — D6489 Other specified anemias: Secondary | ICD-10-CM | POA: Diagnosis present

## 2017-11-07 DIAGNOSIS — J189 Pneumonia, unspecified organism: Secondary | ICD-10-CM | POA: Diagnosis present

## 2017-11-07 DIAGNOSIS — R631 Polydipsia: Secondary | ICD-10-CM | POA: Diagnosis present

## 2017-11-07 DIAGNOSIS — E669 Obesity, unspecified: Secondary | ICD-10-CM | POA: Diagnosis present

## 2017-11-07 DIAGNOSIS — M519 Unspecified thoracic, thoracolumbar and lumbosacral intervertebral disc disorder: Secondary | ICD-10-CM | POA: Diagnosis present

## 2017-11-07 DIAGNOSIS — M7989 Other specified soft tissue disorders: Secondary | ICD-10-CM | POA: Diagnosis not present

## 2017-11-07 DIAGNOSIS — J9602 Acute respiratory failure with hypercapnia: Secondary | ICD-10-CM | POA: Diagnosis not present

## 2017-11-07 DIAGNOSIS — J9809 Other diseases of bronchus, not elsewhere classified: Secondary | ICD-10-CM | POA: Diagnosis not present

## 2017-11-07 DIAGNOSIS — S72141A Displaced intertrochanteric fracture of right femur, initial encounter for closed fracture: Secondary | ICD-10-CM | POA: Diagnosis present

## 2017-11-07 DIAGNOSIS — S72141P Displaced intertrochanteric fracture of right femur, subsequent encounter for closed fracture with malunion: Secondary | ICD-10-CM | POA: Diagnosis not present

## 2017-11-07 DIAGNOSIS — M810 Age-related osteoporosis without current pathological fracture: Secondary | ICD-10-CM | POA: Diagnosis present

## 2017-11-07 DIAGNOSIS — F32A Depression, unspecified: Secondary | ICD-10-CM | POA: Diagnosis present

## 2017-11-07 DIAGNOSIS — F419 Anxiety disorder, unspecified: Secondary | ICD-10-CM | POA: Diagnosis not present

## 2017-11-07 DIAGNOSIS — E876 Hypokalemia: Secondary | ICD-10-CM | POA: Diagnosis present

## 2017-11-07 DIAGNOSIS — E1165 Type 2 diabetes mellitus with hyperglycemia: Secondary | ICD-10-CM | POA: Diagnosis not present

## 2017-11-07 DIAGNOSIS — R52 Pain, unspecified: Secondary | ICD-10-CM

## 2017-11-07 DIAGNOSIS — Z7982 Long term (current) use of aspirin: Secondary | ICD-10-CM

## 2017-11-07 DIAGNOSIS — J9690 Respiratory failure, unspecified, unspecified whether with hypoxia or hypercapnia: Secondary | ICD-10-CM

## 2017-11-07 HISTORY — DX: Anemia, unspecified: D64.9

## 2017-11-07 HISTORY — DX: Cough: R05

## 2017-11-07 HISTORY — DX: Personal history of (healed) traumatic fracture: Z87.81

## 2017-11-07 HISTORY — DX: Hyperlipidemia, unspecified: E78.5

## 2017-11-07 HISTORY — DX: Dysuria: R30.0

## 2017-11-07 HISTORY — DX: Displaced intertrochanteric fracture of right femur, initial encounter for closed fracture: S72.141A

## 2017-11-07 HISTORY — DX: Age-related osteoporosis without current pathological fracture: M81.0

## 2017-11-07 HISTORY — DX: Dyspnea, unspecified: R06.00

## 2017-11-07 HISTORY — DX: Essential (primary) hypertension: I10

## 2017-11-07 HISTORY — DX: Malignant neoplasm of vulva, unspecified: C51.9

## 2017-11-07 HISTORY — DX: Schizoaffective disorder, bipolar type: F25.0

## 2017-11-07 HISTORY — DX: Major depressive disorder, single episode, unspecified: F32.9

## 2017-11-07 HISTORY — DX: Pain in unspecified hip: M25.559

## 2017-11-07 HISTORY — DX: Unspecified urinary incontinence: R32

## 2017-11-07 HISTORY — DX: Chronic respiratory failure with hypoxia: J96.11

## 2017-11-07 HISTORY — DX: Asymptomatic premature menopause: E28.319

## 2017-11-07 HISTORY — DX: Acute and chronic respiratory failure with hypercapnia: J96.22

## 2017-11-07 HISTORY — DX: Hypo-osmolality and hyponatremia: E87.1

## 2017-11-07 HISTORY — DX: Secondary polycythemia: D75.1

## 2017-11-07 HISTORY — DX: Acute and chronic respiratory failure with hypoxia: J96.21

## 2017-11-07 HISTORY — DX: Unspecified thoracic, thoracolumbar and lumbosacral intervertebral disc disorder: M51.9

## 2017-11-07 HISTORY — DX: Chronic obstructive pulmonary disease, unspecified: J44.9

## 2017-11-07 HISTORY — DX: Depression, unspecified: F32.A

## 2017-11-07 HISTORY — DX: Anxiety disorder, unspecified: F41.9

## 2017-11-07 LAB — CBC WITH DIFFERENTIAL/PLATELET
Abs Immature Granulocytes: 0.1 10*3/uL (ref 0.0–0.1)
BASOS PCT: 0 %
Basophils Absolute: 0 10*3/uL (ref 0.0–0.1)
EOS ABS: 0 10*3/uL (ref 0.0–0.7)
Eosinophils Relative: 0 %
HCT: 38 % (ref 36.0–46.0)
Hemoglobin: 12.8 g/dL (ref 12.0–15.0)
Immature Granulocytes: 1 %
Lymphocytes Relative: 10 %
Lymphs Abs: 1.2 10*3/uL (ref 0.7–4.0)
MCH: 28.7 pg (ref 26.0–34.0)
MCHC: 33.7 g/dL (ref 30.0–36.0)
MCV: 85.2 fL (ref 78.0–100.0)
MONO ABS: 0.8 10*3/uL (ref 0.1–1.0)
MONOS PCT: 7 %
Neutro Abs: 9.4 10*3/uL — ABNORMAL HIGH (ref 1.7–7.7)
Neutrophils Relative %: 82 %
PLATELETS: 228 10*3/uL (ref 150–400)
RBC: 4.46 MIL/uL (ref 3.87–5.11)
RDW: 13.9 % (ref 11.5–15.5)
WBC: 11.5 10*3/uL — ABNORMAL HIGH (ref 4.0–10.5)

## 2017-11-07 LAB — BASIC METABOLIC PANEL
ANION GAP: 13 (ref 5–15)
ANION GAP: 9 (ref 5–15)
ANION GAP: 8 (ref 5–15)
Anion gap: 11 (ref 5–15)
BUN: 6 mg/dL (ref 6–20)
BUN: 7 mg/dL (ref 6–20)
BUN: 8 mg/dL (ref 6–20)
BUN: 9 mg/dL (ref 6–20)
CALCIUM: 9.1 mg/dL (ref 8.9–10.3)
CALCIUM: 8.7 mg/dL — AB (ref 8.9–10.3)
CALCIUM: 8.6 mg/dL — AB (ref 8.9–10.3)
CALCIUM: 8.3 mg/dL — AB (ref 8.9–10.3)
CHLORIDE: 87 mmol/L — AB (ref 101–111)
CHLORIDE: 89 mmol/L — AB (ref 101–111)
CHLORIDE: 89 mmol/L — AB (ref 101–111)
CO2: 27 mmol/L (ref 22–32)
CO2: 24 mmol/L (ref 22–32)
CO2: 26 mmol/L (ref 22–32)
CO2: 26 mmol/L (ref 22–32)
CREATININE: 0.51 mg/dL (ref 0.44–1.00)
Chloride: 85 mmol/L — ABNORMAL LOW (ref 101–111)
Creatinine, Ser: 0.5 mg/dL (ref 0.44–1.00)
Creatinine, Ser: 0.51 mg/dL (ref 0.44–1.00)
Creatinine, Ser: 0.53 mg/dL (ref 0.44–1.00)
GFR calc Af Amer: >60 mL/min (ref >60)
GFR calc non Af Amer: >60 mL/min (ref >60)
GFR calc non Af Amer: >60 mL/min (ref >60)
GFR calc non Af Amer: >60 mL/min (ref >60)
GFR calc non Af Amer: >60 mL/min (ref >60)
GLUCOSE: 138 mg/dL — AB (ref 65–99)
GLUCOSE: 119 mg/dL — AB (ref 65–99)
GLUCOSE: 115 mg/dL — AB (ref 65–99)
GLUCOSE: 96 mg/dL (ref 65–99)
POTASSIUM: 4 mmol/L (ref 3.5–5.1)
Potassium: 4 mmol/L (ref 3.5–5.1)
Potassium: 4.2 mmol/L (ref 3.5–5.1)
Potassium: 4 mmol/L (ref 3.5–5.1)
Sodium: 123 mmol/L — ABNORMAL LOW (ref 135–145)
Sodium: 124 mmol/L — ABNORMAL LOW (ref 135–145)
Sodium: 124 mmol/L — ABNORMAL LOW (ref 135–145)
Sodium: 123 mmol/L — ABNORMAL LOW (ref 135–145)

## 2017-11-07 LAB — I-STAT BETA HCG BLOOD, ED (MC, WL, AP ONLY): I-stat hCG, quantitative: <5 m[IU]/mL (ref <5)

## 2017-11-07 LAB — HEMOGLOBIN A1C
Hgb A1c MFr Bld: 5.2 % (ref 4.8–5.6)
Mean Plasma Glucose: 102.54 mg/dL

## 2017-11-07 LAB — CBG MONITORING, ED: GLUCOSE-CAPILLARY: 126 mg/dL — AB (ref 65–99)

## 2017-11-07 LAB — PROTIME-INR
INR: 1.02
Prothrombin Time: 13.3 seconds (ref 11.4–15.2)

## 2017-11-07 LAB — TYPE AND SCREEN
ABO/RH(D): A POS
Antibody Screen: NEGATIVE

## 2017-11-07 LAB — BRAIN NATRIURETIC PEPTIDE: B NATRIURETIC PEPTIDE 5: 206.8 pg/mL — AB (ref 0.0–100.0)

## 2017-11-07 LAB — GLUCOSE, CAPILLARY
GLUCOSE-CAPILLARY: 95 mg/dL (ref 65–99)
Glucose-Capillary: 102 mg/dL — ABNORMAL HIGH (ref 65–99)

## 2017-11-07 LAB — TROPONIN I: Troponin I: 0.03 ng/mL (ref <0.03)

## 2017-11-07 LAB — ABO/RH: ABO/RH(D): A POS

## 2017-11-07 MED ORDER — BUPROPION HCL ER (XL) 300 MG PO TB24
300.0000 mg | ORAL_TABLET | Freq: Every day | ORAL | Status: DC
  Administered 2017-11-07 – 2017-11-08 (×2): 300 mg via ORAL
  Filled 2017-11-07: qty 2
  Filled 2017-11-07: qty 1

## 2017-11-07 MED ORDER — SENNOSIDES-DOCUSATE SODIUM 8.6-50 MG PO TABS
1.0000 | ORAL_TABLET | Freq: Every evening | ORAL | Status: DC | PRN
  Administered 2017-11-07: 1 via ORAL
  Filled 2017-11-07 (×2): qty 1

## 2017-11-07 MED ORDER — ONDANSETRON HCL 4 MG PO TABS
4.0000 mg | ORAL_TABLET | Freq: Four times a day (QID) | ORAL | Status: DC | PRN
  Administered 2017-11-18 – 2017-11-19 (×3): 4 mg via ORAL
  Filled 2017-11-07 (×3): qty 1

## 2017-11-07 MED ORDER — ACETAMINOPHEN 325 MG PO TABS
650.0000 mg | ORAL_TABLET | Freq: Four times a day (QID) | ORAL | Status: DC | PRN
  Administered 2017-11-07: 650 mg via ORAL
  Filled 2017-11-07: qty 2

## 2017-11-07 MED ORDER — ENOXAPARIN SODIUM 40 MG/0.4ML ~~LOC~~ SOLN
40.0000 mg | SUBCUTANEOUS | Status: DC
  Administered 2017-11-07 – 2017-11-14 (×8): 40 mg via SUBCUTANEOUS
  Filled 2017-11-07 (×9): qty 0.4

## 2017-11-07 MED ORDER — ALPRAZOLAM 0.5 MG PO TABS
0.5000 mg | ORAL_TABLET | Freq: Three times a day (TID) | ORAL | Status: DC
  Administered 2017-11-07 – 2017-11-08 (×5): 0.5 mg via ORAL
  Filled 2017-11-07 (×5): qty 1

## 2017-11-07 MED ORDER — HYDROCODONE-ACETAMINOPHEN 5-325 MG PO TABS
1.0000 | ORAL_TABLET | ORAL | Status: DC | PRN
  Administered 2017-11-07 – 2017-11-08 (×2): 2 via ORAL
  Filled 2017-11-07 (×2): qty 2

## 2017-11-07 MED ORDER — DICYCLOMINE HCL 20 MG PO TABS
20.0000 mg | ORAL_TABLET | Freq: Two times a day (BID) | ORAL | Status: DC
  Administered 2017-11-07 – 2017-11-08 (×2): 20 mg via ORAL
  Filled 2017-11-07 (×3): qty 1

## 2017-11-07 MED ORDER — FLUTICASONE FUROATE-VILANTEROL 100-25 MCG/INH IN AEPB
1.0000 | INHALATION_SPRAY | Freq: Every day | RESPIRATORY_TRACT | Status: DC
  Filled 2017-11-07 (×2): qty 28

## 2017-11-07 MED ORDER — CARVEDILOL 25 MG PO TABS
25.0000 mg | ORAL_TABLET | Freq: Two times a day (BID) | ORAL | Status: DC
  Administered 2017-11-07 – 2017-11-08 (×4): 25 mg via ORAL
  Filled 2017-11-07: qty 2
  Filled 2017-11-07 (×4): qty 1

## 2017-11-07 MED ORDER — ACETAMINOPHEN 650 MG RE SUPP: 650.0000 mg | Freq: Four times a day (QID) | RECTAL | Status: DC | PRN

## 2017-11-07 MED ORDER — CLONIDINE HCL 0.3 MG/24HR TD PTWK
0.3000 mg | MEDICATED_PATCH | TRANSDERMAL | Status: DC
  Filled 2017-11-07: qty 1

## 2017-11-07 MED ORDER — TRAZODONE HCL 50 MG PO TABS
50.0000 mg | ORAL_TABLET | Freq: Every day | ORAL | Status: DC
  Administered 2017-11-07: 50 mg via ORAL
  Filled 2017-11-07 (×2): qty 1

## 2017-11-07 MED ORDER — SODIUM CHLORIDE 0.9 % IV SOLN
Freq: Once | INTRAVENOUS | Status: AC
  Administered 2017-11-07: 08:00:00 via INTRAVENOUS

## 2017-11-07 MED ORDER — ONDANSETRON HCL 4 MG/2ML IJ SOLN
4.0000 mg | Freq: Four times a day (QID) | INTRAMUSCULAR | Status: DC | PRN
  Administered 2017-11-20: 4 mg via INTRAVENOUS
  Filled 2017-11-07: qty 2

## 2017-11-07 MED ORDER — METHYLCELLULOSE (LAXATIVE) 500 MG PO TABS: 500.0000 mg | ORAL_TABLET | Freq: Every day | ORAL | Status: DC

## 2017-11-07 MED ORDER — PRAVASTATIN SODIUM 20 MG PO TABS
20.0000 mg | ORAL_TABLET | Freq: Every day | ORAL | Status: DC
  Administered 2017-11-07 – 2017-11-08 (×2): 20 mg via ORAL
  Filled 2017-11-07 (×2): qty 1

## 2017-11-07 MED ORDER — SODIUM CHLORIDE 0.9 % IV SOLN
INTRAVENOUS | Status: DC
  Administered 2017-11-07 (×2): via INTRAVENOUS

## 2017-11-07 MED ORDER — DM-GUAIFENESIN ER 30-600 MG PO TB12
1.0000 | ORAL_TABLET | Freq: Two times a day (BID) | ORAL | Status: DC | PRN
  Filled 2017-11-07: qty 1

## 2017-11-07 MED ORDER — DIVALPROEX SODIUM 250 MG PO DR TAB
250.0000 mg | DELAYED_RELEASE_TABLET | Freq: Two times a day (BID) | ORAL | Status: DC
  Administered 2017-11-07 – 2017-11-08 (×3): 250 mg via ORAL
  Filled 2017-11-07 (×3): qty 1

## 2017-11-07 MED ORDER — ASPIRIN EC 81 MG PO TBEC
81.0000 mg | DELAYED_RELEASE_TABLET | Freq: Every day | ORAL | Status: DC
  Administered 2017-11-07 – 2017-11-08 (×2): 81 mg via ORAL
  Filled 2017-11-07 (×2): qty 1

## 2017-11-07 MED ORDER — PANTOPRAZOLE SODIUM 40 MG PO TBEC
40.0000 mg | DELAYED_RELEASE_TABLET | Freq: Every day | ORAL | Status: DC
  Administered 2017-11-07 – 2017-11-08 (×2): 40 mg via ORAL
  Filled 2017-11-07 (×2): qty 1

## 2017-11-07 MED ORDER — PAROXETINE HCL 20 MG PO TABS
20.0000 mg | ORAL_TABLET | Freq: Every day | ORAL | Status: DC
  Administered 2017-11-08: 20 mg via ORAL
  Filled 2017-11-07: qty 1

## 2017-11-07 MED ORDER — CALCIUM POLYCARBOPHIL 625 MG PO TABS
625.0000 mg | ORAL_TABLET | Freq: Every day | ORAL | Status: DC
  Administered 2017-11-07: 625 mg via ORAL
  Filled 2017-11-07: qty 1

## 2017-11-07 MED ORDER — MORPHINE SULFATE (PF) 4 MG/ML IV SOLN
4.0000 mg | INTRAVENOUS | Status: DC | PRN
  Administered 2017-11-07: 4 mg via INTRAVENOUS
  Filled 2017-11-07 (×2): qty 1

## 2017-11-07 MED ORDER — MAGNESIUM OXIDE 400 (241.3 MG) MG PO TABS: 800.0000 mg | ORAL_TABLET | Freq: Two times a day (BID) | ORAL | Status: DC

## 2017-11-07 MED ORDER — MORPHINE SULFATE (PF) 4 MG/ML IV SOLN
1.0000 mg | INTRAVENOUS | Status: DC | PRN
  Administered 2017-11-07: 4 mg via INTRAVENOUS
  Administered 2017-11-07: 1 mg via INTRAVENOUS
  Filled 2017-11-07: qty 1

## 2017-11-07 MED ORDER — LURASIDONE HCL 40 MG PO TABS
80.0000 mg | ORAL_TABLET | Freq: Every day | ORAL | Status: DC
Start: 2017-11-07 — End: 2017-11-08
  Administered 2017-11-08: 80 mg via ORAL
  Filled 2017-11-07: qty 2
  Filled 2017-11-07: qty 1
  Filled 2017-11-07: qty 2
  Filled 2017-11-07: qty 1

## 2017-11-07 MED ORDER — IPRATROPIUM-ALBUTEROL 0.5-2.5 (3) MG/3ML IN SOLN
3.0000 mL | Freq: Three times a day (TID) | RESPIRATORY_TRACT | Status: DC | PRN
  Administered 2017-11-08: 3 mL via RESPIRATORY_TRACT
  Filled 2017-11-07: qty 3

## 2017-11-07 MED ORDER — FLUTICASONE PROPIONATE 50 MCG/ACT NA SUSP
2.0000 | Freq: Two times a day (BID) | NASAL | Status: DC
  Administered 2017-11-08: 2 via NASAL
  Filled 2017-11-07 (×2): qty 16

## 2017-11-07 MED ORDER — SUCRALFATE 1 G PO TABS
1.0000 g | ORAL_TABLET | Freq: Four times a day (QID) | ORAL | Status: DC
  Administered 2017-11-07 – 2017-11-08 (×5): 1 g via ORAL
  Filled 2017-11-07 (×7): qty 1

## 2017-11-07 MED ORDER — MELATONIN 3 MG PO TABS
9.0000 mg | ORAL_TABLET | Freq: Every day | ORAL | Status: DC
  Administered 2017-11-07: 9 mg via ORAL
  Filled 2017-11-07 (×2): qty 3

## 2017-11-07 MED ORDER — NIFEDIPINE ER OSMOTIC RELEASE 90 MG PO TB24
90.0000 mg | ORAL_TABLET | Freq: Every day | ORAL | Status: DC
  Administered 2017-11-07 – 2017-11-08 (×2): 90 mg via ORAL
  Filled 2017-11-07 (×3): qty 1

## 2017-11-07 MED ORDER — MAGNESIUM HYDROXIDE 400 MG/5ML PO SUSP: 30.0000 mL | Freq: Every evening | ORAL | Status: DC | PRN

## 2017-11-07 MED ORDER — GUAIFENESIN 100 MG/5ML PO SYRP
200.0000 mg | ORAL_SOLUTION | Freq: Three times a day (TID) | ORAL | Status: DC | PRN
  Filled 2017-11-07: qty 10

## 2017-11-07 MED ORDER — HYDROXYZINE HCL 25 MG PO TABS
25.0000 mg | ORAL_TABLET | Freq: Three times a day (TID) | ORAL | Status: DC | PRN
Start: 2017-11-07 — End: 2017-11-08
  Administered 2017-11-08: 25 mg via ORAL
  Filled 2017-11-07 (×2): qty 1

## 2017-11-07 MED ORDER — FERROUS SULFATE 325 (65 FE) MG PO TABS
325.0000 mg | ORAL_TABLET | Freq: Every day | ORAL | Status: DC
  Administered 2017-11-08: 325 mg via ORAL
  Filled 2017-11-07: qty 1

## 2017-11-07 MED ORDER — HYDRALAZINE HCL 50 MG PO TABS
100.0000 mg | ORAL_TABLET | Freq: Three times a day (TID) | ORAL | Status: DC
Start: 2017-11-07 — End: 2017-11-08
  Administered 2017-11-07 – 2017-11-08 (×3): 100 mg via ORAL
  Filled 2017-11-07 (×6): qty 2

## 2017-11-07 MED ORDER — INSULIN ASPART 100 UNIT/ML ~~LOC~~ SOLN
0.0000 [IU] | Freq: Three times a day (TID) | SUBCUTANEOUS | Status: DC
  Administered 2017-11-07: 1 [IU] via SUBCUTANEOUS
  Filled 2017-11-07: qty 1

## 2017-11-07 NOTE — Consult Note (Signed)
Reason for Consult:Right hip fx Referring Physician: T Thomas Stay is an 57 y.o. female.  HPI: Natalie Thomas tried to get OOB and fell onto her right side. She had immediate pain and couldn'Natalie get up. She was brought to the ED where x-rays showed a right intertroch hip fx and orthopedic surgery was consulted. She lives in [place]. She is somewhat altered 2/2 hyponatremia.   Past Medical History:  Diagnosis Date  . CHF (congestive heart failure) ([place])   . COPD (chronic obstructive pulmonary disease) ([place])   . Depression   . Diabetes mellitus, type 2 ([place])    pt denies but states that she has been treated for DM  . GERD (gastroesophageal reflux disease)   . Hypertension   . Osteoporosis     Past Surgical History:  Procedure Laterality Date  . EYE SURGERY     on left  eye, pt states that she sees bad out of the right eye and needs surgery there  . PELVIC FRACTURE SURGERY      No family history on file.  Social History:  reports that she has been smoking cigarettes.  She has been smoking about 0.25 packs per day. She has never used smokeless tobacco. She reports that she does not drink alcohol or use drugs.  Allergies:  Allergies  Allergen Reactions  . Clarithromycin Itching  . Penicillins Itching    Has patient had a PCN reaction causing immediate rash, facial/tongue/throat swelling, SOB or lightheadedness with hypotension: No Has patient had a PCN reaction causing severe rash involving mucus membranes or skin necrosis: No Has patient had a PCN reaction that required hospitalization: Unknown Has patient had a PCN reaction occurring within the last 10 years: No If all of the above answers are "NO", then may proceed with Cephalosporin use.     Medications: I have reviewed the patient's current medications.  Results for orders placed or performed during the hospital encounter of 11/07/17 (from the past 48 hour(s))  Basic metabolic panel     Status: Abnormal   Collection  Time: 11/07/17  5:50 AM  Result Value Ref Range   Sodium 123 (L) 135 - 145 mmol/L   Potassium 4.0 3.5 - 5.1 mmol/L   Chloride 85 (L) 101 - 111 mmol/L   CO2 27 22 - 32 mmol/L   Glucose, Bld 138 (H) 65 - 99 mg/dL   BUN 6 6 - 20 mg/dL   Creatinine, Ser 0.51 0.44 - 1.00 mg/dL   Calcium 9.1 8.9 - 10.3 mg/dL   GFR calc non Af Amer >60 >60 mL/min   GFR calc Af Amer >60 >60 mL/min    Comment: (NOTE) The eGFR has been calculated using the CKD EPI equation. This calculation has not been validated in all clinical situations. eGFR's persistently <60 mL/min signify possible Chronic Kidney Disease.    Anion gap 11 5 - 15    Comment: Performed at [facility] [address] [place], [place] [address]  CBC WITH DIFFERENTIAL     Status: Abnormal   Collection Time: 11/07/17  5:50 AM  Result Value Ref Range   WBC 11.5 (H) 4.0 - 10.5 K/uL   RBC 4.46 3.87 - 5.11 MIL/uL   Hemoglobin 12.8 12.0 - 15.0 g/dL   HCT 38.0 36.0 - 46.0 %   MCV 85.2 78.0 - 100.0 fL   MCH 28.7 26.0 - 34.0 pg   MCHC 33.7 30.0 - 36.0 g/dL   RDW 13.9 11.5 - 15.5 %  Platelets 228 150 - 400 K/uL   Neutrophils Relative % 82 %   Neutro Abs 9.4 (H) 1.7 - 7.7 K/uL   Lymphocytes Relative 10 %   Lymphs Abs 1.2 0.7 - 4.0 K/uL   Monocytes Relative 7 %   Monocytes Absolute 0.8 0.1 - 1.0 K/uL   Eosinophils Relative 0 %   Eosinophils Absolute 0.0 0.0 - 0.7 K/uL   Basophils Relative 0 %   Basophils Absolute 0.0 0.0 - 0.1 K/uL   Immature Granulocytes 1 %   Abs Immature Granulocytes 0.1 0.0 - 0.1 K/uL    Comment: Performed at [facility] [address] [place], [place] [address]  Protime-INR     Status: None   Collection Time: 11/07/17  5:50 AM  Result Value Ref Range   Prothrombin Time 13.3 11.4 - 15.2 seconds   INR 1.02     Comment: Performed at [facility] [address] [place], [place] [address]  I-Stat beta hCG blood, ED     Status: None   Collection Time: 11/07/17  6:17 AM  Result Value Ref Range    I-stat hCG, quantitative <5.0 <5 mIU/mL   Comment 3            Comment:   GEST. AGE      CONC.  (mIU/mL)   <=1 WEEK        5 - 50     2 WEEKS       50 - 500     3 WEEKS       100 - 10,000     4 WEEKS     1,000 - 30,000        FEMALE AND NON-PREGNANT FEMALE:     LESS THAN 5 mIU/mL   Type and screen [place]     Status: None   Collection Time: 11/07/17  6:23 AM  Result Value Ref Range   ABO/RH(D) A POS    Antibody Screen NEG    Sample Expiration      11/10/2017 Performed at [facility], [place] [address], [place], [place] [address]     Dg Chest 1 View  Result Date: 11/07/2017 CLINICAL DATA:  Status post fall, with concern for chest injury. Initial encounter. EXAM: CHEST  1 VIEW COMPARISON:  Chest radiograph performed 11/04/2017 FINDINGS: The lungs are well-aerated. Mild vascular congestion is noted. Peribronchial thickening is seen. Chronically increased interstitial markings are noted. There is no evidence of pleural effusion or pneumothorax. The cardiomediastinal silhouette is within normal limits. No acute osseous abnormalities are seen. IMPRESSION: Mild vascular congestion noted. Peribronchial thickening seen. Chronically increased interstitial markings noted. No displaced rib fractures identified. Electronically Signed   By: Garald Balding M.D.   On: 11/07/2017 06:47   Dg Hip Unilat  With Pelvis 2-3 Views Right  Result Date: 11/07/2017 CLINICAL DATA:  Status post fall, with right hip pain, acute onset. Initial encounter. EXAM: DG HIP (WITH OR WITHOUT PELVIS) 2-3V RIGHT COMPARISON:  Right hip radiographs performed 07/31/2017 FINDINGS: There is a comminuted right femoral intertrochanteric fracture, with mild medial rotation of the distal femur. The right femoral head remains seated at the acetabulum. The left hip joint is unremarkable. There appears to be healing chronic fractures of the right superior and inferior pubic rami. Postoperative changes are noted at  the sacrum bilaterally. The visualized bowel gas pattern is grossly unremarkable. IMPRESSION: 1. Comminuted right femoral intertrochanteric fracture, with mild medial rotation of  the distal femur. 2. Healing chronic fractures of the right superior and inferior pubic rami. Electronically Signed   By: Garald Balding M.D.   On: 11/07/2017 06:46    Review of Systems  Constitutional: Negative for weight loss.  HENT: Negative for ear discharge, ear pain, hearing loss and tinnitus.   Eyes: Negative for blurred vision, double vision, photophobia and pain.  Respiratory: Negative for cough, sputum production and shortness of breath.   Cardiovascular: Negative for chest pain.  Gastrointestinal: Negative for abdominal pain, nausea and vomiting.  Genitourinary: Negative for dysuria, flank pain, frequency and urgency.  Musculoskeletal: Positive for joint pain (Right hip). Negative for back pain, falls, myalgias and neck pain.  Neurological: Negative for dizziness, tingling, sensory change, focal weakness, loss of consciousness and headaches.  Endo/Heme/Allergies: Does not bruise/bleed easily.  Psychiatric/Behavioral: Negative for depression, memory loss and substance abuse. The patient is not nervous/anxious.    Blood pressure (!) 170/88, pulse 75, temperature 98.3 F (36.8 C), resp. rate 20, SpO2 94 %. Physical Exam  Constitutional: She appears well-developed and well-nourished. No distress.  HENT:  Head: Normocephalic and atraumatic.  Eyes: Conjunctivae are normal. Right eye exhibits no discharge. Left eye exhibits no discharge. No scleral icterus.  Neck: Normal range of motion.  Cardiovascular: Normal rate and regular rhythm.  Respiratory: Effort normal. No respiratory distress.  Musculoskeletal:  RLE No traumatic wounds, ecchymosis, or rash  TTP hip  No knee or ankle effusion  Knee stable to varus/ valgus and anterior/posterior stress  Sens DPN, SPN, TN intact  Motor EHL, ext, flex, evers  5/5  DP 2+, PT 2+, 1+ NP edema  Neurological: She is alert.  Skin: Skin is warm and dry. She is not diaphoretic.  Psychiatric: Her mood appears anxious. She is agitated. Thought content is delusional (Inappropriate words).    Assessment/Plan: Fall Right intertroch hip fx -- Will need IMN when cleared by IM, they think 48h or so to get Na+ over 127. NWB until then. Right sup/inf rami fxs, subacute -- No treatment necessary Multiple medical problems including HTN, CHF, DM, O2 dependent COPD, GERD, and schizoaffective disorder -- per IM    Lisette Abu, PA-C Orthopedic Surgery [phone] 11/07/2017, 8:50 AM

## 2017-11-07 NOTE — Telephone Encounter (Signed)
Spoke with [facility] notified her of appointment with Amy on 11/11/17 at 1:30pm.

## 2017-11-07 NOTE — Plan of Care (Signed)
  Problem: Education: Goal: Knowledge of General Education information will improve Outcome: Progressing   

## 2017-11-07 NOTE — Telephone Encounter (Signed)
Pt scheduled to see Amy Esterwood PA 11/11/17@1 :30pm. Please notify pt of appt.

## 2017-11-07 NOTE — H&P (Addendum)
History and Physical    Natalie Thomas WSF:[id number] DOB: [birth year] DOA: 11/07/2017   PCP: Patient, No Pcp Per   Patient coming from:  Home    Chief Complaint: Unwitnessed fall   HPI: Natalie Thomas is a 57 y.o. female with history of CHF, COPD on home oxygen at 2 L nasal cannula, depression, diabetes type 2, GERD, hypertension, chronic hyponatremia baseline 131, bipolar disorder, depression, GERD, [facility], after sustaining an unwitnessed fall.  The patient was found lying on the floor, unknown length.  The patient endorsed right hip pain immediately after being found.  She is a poor historian, and she may have some degree of confusion at this time, for which other information cannot be obtained.   She manages to say "yes" or "no", denying shortness of breath, chest pain, abdominal discomfort. She does admit to be very thirsty, drinking "a lot" of water and ice chips .  She had been seen in the ER on May 13 with abdominal pain felt to be secondary to constipation due to narcotics, and she had one prior admission from 4/24 through 10/19/2017 for enteritis and dyspnea, placed on Solu-Medrol,  At the time her sodium was 128, improved with IV fluids.  At time of discharge, she was in fairly stable clinical condition.  At the time, she was sent home with sodium tablets 1 g 3 times a day, unclear if she had been taking it .No new infections. No other reported falls   ED Course:  BP (!) 156/91   Pulse 75   Temp 98.3 F (36.8 C)   Resp 20   SpO2 94%   Prior to her discharge on 427, as mentioned above, her sodium was 06/26/1986, cortisol and TSH levels were normal, Today, her sodium is 123 Potassium normal at 4, chloride 85, bicarb 27, glucose 138, creatinine 0.51, calcium 9.1, anion gap 11 BNP is pending Troponin and EKG is pending Urinalysis pending White count 11.5, hemoglobin 12.8, platelets 128. The echo on 10/16/2017 shows EF 55 to  60%, with normal systolic and grade 1 diastolic X-ray with mild vascular congestion, chronically increased interstitial markings, no displaced rib fractures identified. Right hip x-ray comminuted right intertrochanteric fracture with medial rotation of the distal femur.  Orthopedic consultation is pending.  Review of Systems:  As per HPI otherwise all other systems reviewed and are negative  Past Medical History:  Diagnosis Date  . CHF (congestive heart failure) ([place])   . COPD (chronic obstructive pulmonary disease) ([place])   . Depression   . Diabetes mellitus, type 2 ([place])    pt denies but states that she has been treated for DM  . GERD (gastroesophageal reflux disease)   . Hypertension   . Osteoporosis     Past Surgical History:  Procedure Laterality Date  . EYE SURGERY     on left  eye, pt states that she sees bad out of the right eye and needs surgery there  . PELVIC FRACTURE SURGERY      Social History Social History   Socioeconomic History  . Marital status: Divorced    Spouse name: Not on file  . Number of children: Not on file  . Years of education: Not on file  . Highest education level: Not on file  Occupational History  . Occupation: Disabled  Social Needs  . Financial resource strain: Not on file  . Food insecurity:    Worry: Not on  file    Inability: Not on file  . Transportation needs:    Medical: Not on file    Non-medical: Not on file  Tobacco Use  . Smoking status: Current Every Day Smoker    Packs/day: 0.25    Types: Cigarettes  . Smokeless tobacco: Never Used  Substance and Sexual Activity  . Alcohol use: No  . Drug use: No  . Sexual activity: Never  Lifestyle  . Physical activity:    Days per week: Not on file    Minutes per session: Not on file  . Stress: Not on file  Relationships  . Social connections:    Talks on phone: Not on file    Gets together: Not on file    Attends religious service: Not on file    Active member of club or  organization: Not on file    Attends meetings of clubs or organizations: Not on file    Relationship status: Not on file  . Intimate partner violence:    Fear of current or ex partner: Not on file    Emotionally abused: Not on file    Physically abused: Not on file    Forced sexual activity: Not on file  Other Topics Concern  . Not on file  Social History Narrative   single     Allergies  Allergen Reactions  . Clarithromycin Itching  . Penicillins Itching    Has patient had a PCN reaction causing immediate rash, facial/tongue/throat swelling, SOB or lightheadedness with hypotension: No Has patient had a PCN reaction causing severe rash involving mucus membranes or skin necrosis: No Has patient had a PCN reaction that required hospitalization: Unknown Has patient had a PCN reaction occurring within the last 10 years: No If all of the above answers are "NO", then may proceed with Cephalosporin use.     No family history on file.    Prior to Admission medications   Medication Sig Start Date End Date Taking? Authorizing Provider  acetaminophen (TYLENOL) 325 MG tablet Take 650 mg by mouth every 6 (six) hours as needed for mild pain.   Yes [provider]  albuterol (PROVENTIL HFA;VENTOLIN HFA) 108 (90 Base) MCG/ACT inhaler Inhale 2 puffs into the lungs every 4 (four) hours as needed for wheezing or shortness of breath. 07/05/17  Yes Ward, Delice Bison, DO  ALPRAZolam (XANAX) 0.5 MG tablet Take 1 tablet (0.5 mg total) by mouth 3 (three) times daily. 10/19/17  Yes Aline August, MD  alum & mag hydroxide-simeth ([place]) 200-200-20 MG/5ML suspension Take 30 mLs by mouth every 6 (six) hours as needed for indigestion or heartburn.   Yes [provider]  aspirin 81 MG tablet Take 1 tablet (81 mg total) by mouth daily. For heart health 08/06/11  Yes Greig Castilla, FNP  buPROPion (WELLBUTRIN XL) 300 MG 24 hr tablet Take 1 tablet (300 mg total) by mouth every morning. Patient  taking differently: Take 300 mg by mouth daily.  10/19/17 10/19/18 Yes Aline August, MD  carvedilol (COREG) 25 MG tablet Take 25 mg by mouth 2 (two) times daily with a meal.   Yes [provider]  cloNIDine (CATAPRES - DOSED IN MG/24 HR) 0.3 mg/24hr patch Place 0.3 mg onto the skin once a week. On Monday   Yes [provider]  dextromethorphan-guaiFENesin (MUCINEX DM) 30-600 MG 12hr tablet Take 1 tablet by mouth every 12 (twelve) hours as needed for cough.   Yes [provider]  divalproex (DEPAKOTE) 250  MG DR tablet Take 1 tablet (250 mg total) by mouth every 12 (twelve) hours. 08/12/17  Yes Nita Sells, MD  ferrous sulfate 325 (65 FE) MG tablet Take 325 mg by mouth daily with breakfast.   Yes [provider]  fluticasone (FLONASE) 50 MCG/ACT nasal spray Place 2 sprays into both nostrils 2 (two) times daily.   Yes [provider]  fluticasone furoate-vilanterol (BREO ELLIPTA) 100-25 MCG/INH AEPB Inhale 1 puff into the lungs daily.   Yes [provider]  guaifenesin (ROBITUSSIN) 100 MG/5ML syrup Take 200 mg by mouth 3 (three) times daily as needed for cough.   Yes [provider]  hydrALAZINE (APRESOLINE) 100 MG tablet Take 100 mg by mouth every 8 (eight) hours.   Yes [provider]  hydrOXYzine (ATARAX/VISTARIL) 25 MG tablet Take 1 tablet (25 mg total) by mouth 3 (three) times daily as needed for anxiety. 10/19/17  Yes Aline August, MD  ipratropium-albuterol (DUONEB) 0.5-2.5 (3) MG/3ML SOLN Take 3 mLs by nebulization 3 (three) times daily as needed (SOb).   Yes [provider]  lurasidone (LATUDA) 80 MG TABS tablet Take 1 tablet (80 mg total) by mouth daily with breakfast. Patient taking differently: Take 40 mg by mouth daily with breakfast.  08/12/17  Yes Nita Sells, MD  magnesium hydroxide (MILK OF MAGNESIA) 400 MG/5ML suspension Take 30 mLs by mouth at bedtime as needed for mild constipation.   Yes  [provider]  magnesium oxide (MAG-OX) 400 (241.3 Mg) MG tablet Take 2 tablets (800 mg total) by mouth 2 (two) times daily. 08/12/17  Yes Nita Sells, MD  Melatonin 10 MG TABS Take 10 mg by mouth at bedtime.   Yes [provider]  Menthol, Topical Analgesic, (BIOFREEZE COLORLESS EX) Apply 1 application topically 3 (three) times daily. TO UPPER ABDOMEN   Yes [provider]  Methylcellulose, Laxative, 500 MG TABS Take 500 mg by mouth at bedtime.   Yes [provider]  Multiple Vitamin (DAILY-VITE PO) Take 1 tablet by mouth daily.   Yes [provider]  neomycin-bacitracin-polymyxin (NEOSPORIN) OINT Apply 1 application topically daily.    Yes [provider]  NIFEdipine (PROCARDIA XL/ADALAT-CC) 90 MG 24 hr tablet Take 90 mg by mouth daily.   Yes [provider]  omeprazole (PRILOSEC) 20 MG capsule Take 1 capsule (20 mg total) by mouth 2 (two) times daily before a meal. For acid reflux. 10/19/17  Yes Aline August, MD  oxyCODONE (OXY IR/ROXICODONE) 5 MG immediate release tablet Take 1 tablet (5 mg total) by mouth every 6 (six) hours as needed for severe pain. 10/19/17  Yes Aline August, MD  PARoxetine (PAXIL) 20 MG tablet Take 1 tablet (20 mg total) by mouth daily. 10/19/17  Yes Aline August, MD  polyethylene glycol (MIRALAX) packet Take 17 g by mouth daily as needed. 10/19/17  Yes Aline August, MD  pravastatin (PRAVACHOL) 20 MG tablet Take 20 mg by mouth daily.   Yes [provider]  sodium chloride 1 g tablet Take 1 g by mouth 3 (three) times daily with meals.    Yes [provider]  sucralfate (CARAFATE) 1 g tablet Take 1 g by mouth 4 (four) times daily.   Yes [provider]  traZODone (DESYREL) 50 MG tablet Take 50 mg by mouth at bedtime.   Yes [provider]  dicyclomine (BENTYL) 20 MG tablet Take 1 tablet (20 mg total) by mouth 2 (two) times daily. 11/04/17   Recardo Evangelist,  PA-C      Physical Exam:  Vitals:   11/07/17 0645 11/07/17 0700 11/07/17 0745 11/07/17 0800  BP: (!) 171/84 (!) 180/79 (!) 170/88 (!) 156/91  Pulse:      Resp:      Temp:      SpO2:       Constitutional: No acute distress, however the patient is very uncomfortable after her fall, she is awake, but not engaging in conversation, although able to answer very simple questions with yes or no Eyes: PERRL, lids and conjunctivae normal ENMT: Mucous membranes are moist, without exudate or lesions  Neck: normal, supple, no masses, no thyromegaly Respiratory: clear to auscultation bilaterally, no wheezing, bibasilar crackles. Normal respiratory effort  Cardiovascular: Regular rate and rhythm,  murmur, rubs or gallops. No extremity edema. 2+ pedal pulses. No carotid bruits.  Abdomen: Soft, non tender, No hepatosplenomegaly. Bowel sounds positive.  Musculoskeletal: no clubbing / cyanosis.  Right leg with external rotation, immobilized at this time Skin: no jaundice, No lesions.  Neurologic: Sensation intact  Strength unable to assess, as the patient is not cooperating at this time. Unable to engage in conversation.  Psychiatric:   Alert and oriented x 2, to name and place, cannot tell date, somewhat depressed mood, and tearful    Labs on Admission: I have personally reviewed following labs and imaging studies  CBC: Recent Labs  Lab 11/04/17 1934 11/04/17 1938 11/07/17 0550  WBC 6.3  --  11.5*  NEUTROABS 3.8  --  9.4*  HGB 12.1 12.9 12.8  HCT 38.1 38.0 38.0  MCV 90.3  --  85.2  PLT 181  --  161    Basic Metabolic Panel: Recent Labs  Lab 11/04/17 1934 11/04/17 1938 11/07/17 0550  NA 133* 131* 123*  K 4.2 4.1 4.0  CL 94* 91* 85*  CO2 30  --  27  GLUCOSE 104* 105* 138*  BUN 7 5* 6  CREATININE 0.51 0.60 0.51  CALCIUM 8.8*  --  9.1    GFR: CrCl cannot be calculated (Unknown ideal weight.).  Liver Function Tests: Recent Labs  Lab 11/04/17 1934  AST 16  ALT 15  ALKPHOS 73   BILITOT 0.1*  PROT 6.7  ALBUMIN 3.4*   Recent Labs  Lab 11/04/17 1933  LIPASE 26   No results for input(s): AMMONIA in the last 168 hours.  Coagulation Profile: Recent Labs  Lab 11/07/17 0550  INR 1.02    Cardiac Enzymes: No results for input(s): CKTOTAL, CKMB, CKMBINDEX, TROPONINI in the last 168 hours.  BNP (last 3 results) No results for input(s): PROBNP in the last 8760 hours.  HbA1C: No results for input(s): HGBA1C in the last 72 hours.  CBG: No results for input(s): GLUCAP in the last 168 hours.  Lipid Profile: No results for input(s): CHOL, HDL, LDLCALC, TRIG, CHOLHDL, LDLDIRECT in the last 72 hours.  Thyroid Function Tests: No results for input(s): TSH, T4TOTAL, FREET4, T3FREE, THYROIDAB in the last 72 hours.  Anemia Panel: No results for input(s): VITAMINB12, FOLATE, FERRITIN, TIBC, IRON, RETICCTPCT in the last 72 hours.  Urine analysis:    Component Value Date/Time   COLORURINE STRAW (A) 11/04/2017 2024   APPEARANCEUR CLEAR 11/04/2017 2024   APPEARANCEUR Clear 06/22/2012 0618   LABSPEC 1.002 (L) 11/04/2017 2024   LABSPEC 1.003 06/22/2012 0618   PHURINE 7.0 11/04/2017 2024   GLUCOSEU NEGATIVE 11/04/2017 2024   GLUCOSEU Negative 06/22/2012 0618   GLUCOSEU NEGATIVE 04/09/2011 [place] 11/04/2017 2024  BILIRUBINUR NEGATIVE 11/04/2017 2024   BILIRUBINUR Negative 06/22/2012 0618   [place] 11/04/2017 2024   PROTEINUR NEGATIVE 11/04/2017 2024   UROBILINOGEN 1.0 08/18/2011 1805   NITRITE NEGATIVE 11/04/2017 2024   LEUKOCYTESUR NEGATIVE 11/04/2017 2024   LEUKOCYTESUR Trace 06/22/2012 0618    Sepsis Labs: @LABRCNTIP (procalcitonin:4,lacticidven:4) )No results found for this or any previous visit (from the past 240 hour(s)).   Radiological Exams on Admission: Dg Chest 1 View  Result Date: 11/07/2017 CLINICAL DATA:  Status post fall, with concern for chest injury. Initial encounter. EXAM: CHEST  1 VIEW COMPARISON:  Chest  radiograph performed 11/04/2017 FINDINGS: The lungs are well-aerated. Mild vascular congestion is noted. Peribronchial thickening is seen. Chronically increased interstitial markings are noted. There is no evidence of pleural effusion or pneumothorax. The cardiomediastinal silhouette is within normal limits. No acute osseous abnormalities are seen. IMPRESSION: Mild vascular congestion noted. Peribronchial thickening seen. Chronically increased interstitial markings noted. No displaced rib fractures identified. Electronically Signed   By: Garald Balding M.D.   On: 11/07/2017 06:47   Dg Hip Unilat  With Pelvis 2-3 Views Right  Result Date: 11/07/2017 CLINICAL DATA:  Status post fall, with right hip pain, acute onset. Initial encounter. EXAM: DG HIP (WITH OR WITHOUT PELVIS) 2-3V RIGHT COMPARISON:  Right hip radiographs performed 07/31/2017 FINDINGS: There is a comminuted right femoral intertrochanteric fracture, with mild medial rotation of the distal femur. The right femoral head remains seated at the acetabulum. The left hip joint is unremarkable. There appears to be healing chronic fractures of the right superior and inferior pubic rami. Postoperative changes are noted at the sacrum bilaterally. The visualized bowel gas pattern is grossly unremarkable. IMPRESSION: 1. Comminuted right femoral intertrochanteric fracture, with mild medial rotation of the distal femur. 2. Healing chronic fractures of the right superior and inferior pubic rami. Electronically Signed   By: Garald Balding M.D.   On: 11/07/2017 06:46    EKG: Independently reviewed.  Assessment/Plan Active Problems:   Chronic hyponatremia   Essential hypertension   GERD   Osteoporosis   Lumbar disc disease   Polycythemia secondary to smoking   Schizoaffective disorder, bipolar type ([facility])   Hyponatremia   Incontinence of urine   Hyperlipidemia   Chronic respiratory failure with hypoxia ([facility])   Anemia   Anxiety and depression   S/P right  hip fracture  Right hip Fracture hip x ray remarkable right comminuted intertrochanteric fracture   Received  IV pain meds, immobilized.  Orthopedic work-up in progress. Admit to med surg Hip fracture order set IVF Pain control with morphine, oxycodone, Tylenol [place] for surgery not earlier than Thursday till sodium normalizes  PT/OT consult postop   Hyponatremia  likely due to polydipsia versus other meds such as antidepressants.  Baseline is 131, recently discharged from the hospital with hyponatremia, at that time her sodium was 128, currently is 123.  Unclear if she has been taking her salt tabs.  Clearly she has any neuro deficits, as she has chronic bipolar and depressive disorder.  No seizures were witnessed.  Recent admission showed normal cortisol and TSH levels. IV Normal saline at 75 cc an hour, with absolute hold of free water at this time. follow BMP Q6 hours to not increase the sodium more than 10 mEq over 24-hours.  To avoid central pontine myeliolysis No ice chips  COPD without exacerbation, in a patient with chronic hypoxic respiratory failure patient had 2 L at home, will continue same.  Continue nebs and O2 prn  Continue prn home mucinex and robitussin  Anemia of chronic disease Hemoglobin on admission 12.8 stable   Repeat CBC in am  No transfusion is indicated at this time Continue oral iron   Diastolic CHF: No acute decompensation weight 199 lbs   Last 2 D echo  10/16/2017 shows EF 55 to 60%, with normal systolic and grade 1 diastolic Continue Coreg, hydralazine Obtain daily weights Monitor intake and output   Hyperlipidemia Continue home statins   Type II Diabetes Current blood sugar level is 134 Lab Results  Component Value Date   HGBA1C 6.3 06/02/2012  Hgb A1C SSI  Mood disorder, patient takes significant amount of psych meds, but at this time will not discontinue Continue Latuda, Depakote, Paxil, Desyrel, Wellbutrin (with recent increase in dose to  300) , and Xanax Continue to follow as an outpatient with psychiatry  Recent history of enteritis, stool GI PCR negative at the time.  No acute issues. Patient on chronic laxatives due to chronic pain, at this time is safe to continue, as no acute abdominal findings were noted. Continue Bentyl   Hypertension BP   156/91   Pulse 75  Controlled Continue home anti-hypertensive medications nifedipine, Coreg and hydralazine.  Has not been on Lasix since last discharge due to hyponatremia   GERD, no acute symptoms Continue Carafate and Protonix   DVT prophylaxis:  Lovenox Code Status:    Full  Family Communication:  Discussed with patient Disposition Plan: Expect patient to be discharged to home after condition improves Consults called:    Orthopedics by EDP Admission status: Med Surg IP    Sharene Butters, PA-C [facility]   Amion text  [phone]   11/07/2017, 9:13 AM

## 2017-11-07 NOTE — ED Provider Notes (Signed)
[facility] EMERGENCY DEPARTMENT Provider Note   CSN: [id number] Arrival date & time: 11/07/17  0537     History   Chief Complaint Chief Complaint  Patient presents with  . Hip Pain    HPI Natalie Thomas is a 57 y.o. female with a history of CHF, COPD on 2L [place], DM type II, HTN, schizoaffective disorder who presents to the emergency department from [place] by EMS with a chief complaint of unwitnessed fall.  The patient was found lying on the floor.  She is endorsing right hip pain.  She is a poor historian and although she is alert and oriented x3 she seems confused.  She denies chest pain, shortness of breath, and abdominal pain.  Patient was seen in the ED 2 days ago for abdominal pain and dyspnea that it persisted over the last month.  Work-up was reassuring and the patient was discharged back to [facility].  It was suspected that her abdominal pain was secondary to chronic narcotic use.  Last admission was April 24-27 for hyponatremia, dyspnea, and enteritis during which the patient received Solu-Medrol.  Sodium was 128 that resolved with fluids.  No other information is able to be obtained from the patient at this time.  Level 5 caveat secondary to acuity of condition.  The history is provided by the patient. No language interpreter was used.    Past Medical History:  Diagnosis Date  . Acute on chronic respiratory failure with hypoxia and hypercapnia ([place]) 10/07/2017  . Anemia 10/16/2017  . Anxiety and depression   . CHF (congestive heart failure) ([place])   . Chronic hyponatremia 04/08/2007   Qualifier: Diagnosis of  By: Marca Ancona RMA, Lucy    . Chronic respiratory failure with hypoxia ([place]) 10/07/2017  . Closed comminuted intertrochanteric fracture of right femur ([place]) 11/07/2017  . COPD 04/08/2007   Qualifier: Diagnosis of  By: Marca Ancona RMA, Lucy    . COPD (chronic obstructive pulmonary disease) ([place])   . Cough 05/08/2011  . Depression   .  Diabetes mellitus, type 2 ([place])    pt denies but states that she has been treated for DM  . Dyspnea 10/16/2017  . Dysuria 04/08/2007   Qualifier: Diagnosis of  By: Reatha Armour, Lucy    . Essential hypertension 04/08/2007   Qualifier: Diagnosis of  By: Marca Ancona RMA, Lucy    . GERD (gastroesophageal reflux disease)   . HIP PAIN, LEFT 12/12/2007   Qualifier: Diagnosis of  By: Loanne Drilling MD, Jacelyn Pi   . Hyperlipidemia 10/07/2017  . Hypertension   . Hyponatremia 07/27/2011  . Incontinence of urine 08/14/2011  . Lumbar disc disease 04/09/2011  . MENOPAUSE, PREMATURE 04/08/2007   Qualifier: Diagnosis of  By: Marca Ancona RMA, Lucy    . 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 Osteoporosis   . Osteoporosis 04/08/2007   Qualifier: Diagnosis of  By: Reatha Armour, Lucy    . Polycythemia secondary to smoking 04/09/2011  . S/P right hip fracture 11/07/2017  . Schizoaffective disorder, bipolar type ([place]) 07/05/2011    Patient Active Problem List   Diagnosis Date Noted  . S/P right hip fracture 11/07/2017  . Closed comminuted intertrochanteric fracture of right femur ([place]) 11/07/2017  . Anxiety and depression   . Dyspnea 10/16/2017  . Anemia 10/16/2017  . COPD exacerbation ([place]) 10/07/2017  . Hyperlipidemia 10/07/2017  . Chronic respiratory failure with hypoxia ([place]) 10/07/2017  . Acute on  chronic respiratory failure with hypoxia and hypercapnia ([place]) 10/07/2017  . Incontinence of urine 08/14/2011  . Hyponatremia 07/27/2011  . Schizoaffective disorder, bipolar type ([place]) 07/05/2011  . Cough 05/08/2011  . Encounter for long-term (current) use of other medications 04/09/2011  . Lumbar disc disease 04/09/2011  . Polycythemia secondary to smoking 04/09/2011  . HIP PAIN, LEFT 12/12/2007  . 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 DIABETES MELLITUS, TYPE II 04/08/2007  . MENOPAUSE, PREMATURE 04/08/2007  . Chronic hyponatremia 04/08/2007  . Schizoaffective disorder  ([place]) 04/08/2007  . DEPRESSION 04/08/2007  . Essential hypertension 04/08/2007  . COPD 04/08/2007  . GERD 04/08/2007  . Osteoporosis 04/08/2007  . DYSURIA 04/08/2007    Past Surgical History:  Procedure Laterality Date  . EYE SURGERY     on left  eye, pt states that she sees bad out of the right eye and needs surgery there  . PELVIC FRACTURE SURGERY       OB History   None      Home Medications    Prior to Admission medications   Medication Sig Start Date End Date Taking? Authorizing Provider  acetaminophen (TYLENOL) 325 MG tablet Take 650 mg by mouth every 6 (six) hours as needed for mild pain.   Yes [provider]  albuterol (PROVENTIL HFA;VENTOLIN HFA) 108 (90 Base) MCG/ACT inhaler Inhale 2 puffs into the lungs every 4 (four) hours as needed for wheezing or shortness of breath. 07/05/17  Yes Ward, Delice Bison, DO  ALPRAZolam (XANAX) 0.5 MG tablet Take 1 tablet (0.5 mg total) by mouth 3 (three) times daily. 10/19/17  Yes Aline August, MD  alum & mag hydroxide-simeth ([place]) 200-200-20 MG/5ML suspension Take 30 mLs by mouth every 6 (six) hours as needed for indigestion or heartburn.   Yes [provider]  aspirin 81 MG tablet Take 1 tablet (81 mg total) by mouth daily. For heart health 08/06/11  Yes Greig Castilla, FNP  buPROPion (WELLBUTRIN XL) 300 MG 24 hr tablet Take 1 tablet (300 mg total) by mouth every morning. Patient taking differently: Take 300 mg by mouth daily.  10/19/17 10/19/18 Yes Aline August, MD  carvedilol (COREG) 25 MG tablet Take 25 mg by mouth 2 (two) times daily with a meal.   Yes [provider]  cloNIDine (CATAPRES - DOSED IN MG/24 HR) 0.3 mg/24hr patch Place 0.3 mg onto the skin once a week. On Monday   Yes [provider]  dextromethorphan-guaiFENesin (MUCINEX DM) 30-600 MG 12hr tablet Take 1 tablet by mouth every 12 (twelve) hours as needed for cough.   Yes [provider]  divalproex (DEPAKOTE) 250 MG DR  tablet Take 1 tablet (250 mg total) by mouth every 12 (twelve) hours. 08/12/17  Yes Nita Sells, MD  ferrous sulfate 325 (65 FE) MG tablet Take 325 mg by mouth daily with breakfast.   Yes [provider]  fluticasone (FLONASE) 50 MCG/ACT nasal spray Place 2 sprays into both nostrils 2 (two) times daily.   Yes [provider]  fluticasone furoate-vilanterol (BREO ELLIPTA) 100-25 MCG/INH AEPB Inhale 1 puff into the lungs daily.   Yes [provider]  guaifenesin (ROBITUSSIN) 100 MG/5ML syrup Take 200 mg by mouth 3 (three) times daily as needed for cough.   Yes [provider]  hydrALAZINE (APRESOLINE) 100 MG tablet Take 100 mg by mouth every 8 (eight) hours.   Yes [provider]  hydrOXYzine (ATARAX/VISTARIL) 25 MG tablet Take 1 tablet (25 mg total) by mouth 3 (  three) times daily as needed for anxiety. 10/19/17  Yes Aline August, MD  ipratropium-albuterol (DUONEB) 0.5-2.5 (3) MG/3ML SOLN Take 3 mLs by nebulization 3 (three) times daily as needed (SOb).   Yes [provider]  lurasidone (LATUDA) 80 MG TABS tablet Take 1 tablet (80 mg total) by mouth daily with breakfast. Patient taking differently: Take 40 mg by mouth daily with breakfast.  08/12/17  Yes Nita Sells, MD  magnesium hydroxide (MILK OF MAGNESIA) 400 MG/5ML suspension Take 30 mLs by mouth at bedtime as needed for mild constipation.   Yes [provider]  magnesium oxide (MAG-OX) 400 (241.3 Mg) MG tablet Take 2 tablets (800 mg total) by mouth 2 (two) times daily. 08/12/17  Yes Nita Sells, MD  Melatonin 10 MG TABS Take 10 mg by mouth at bedtime.   Yes [provider]  Menthol, Topical Analgesic, (BIOFREEZE COLORLESS EX) Apply 1 application topically 3 (three) times daily. TO UPPER ABDOMEN   Yes [provider]  Methylcellulose, Laxative, 500 MG TABS Take 500 mg by mouth at bedtime.   Yes [provider]  Multiple Vitamin  (DAILY-VITE PO) Take 1 tablet by mouth daily.   Yes [provider]  neomycin-bacitracin-polymyxin (NEOSPORIN) OINT Apply 1 application topically daily.    Yes [provider]  NIFEdipine (PROCARDIA XL/ADALAT-CC) 90 MG 24 hr tablet Take 90 mg by mouth daily.   Yes [provider]  omeprazole (PRILOSEC) 20 MG capsule Take 1 capsule (20 mg total) by mouth 2 (two) times daily before a meal. For acid reflux. 10/19/17  Yes Aline August, MD  oxyCODONE (OXY IR/ROXICODONE) 5 MG immediate release tablet Take 1 tablet (5 mg total) by mouth every 6 (six) hours as needed for severe pain. 10/19/17  Yes Aline August, MD  PARoxetine (PAXIL) 20 MG tablet Take 1 tablet (20 mg total) by mouth daily. 10/19/17  Yes Aline August, MD  polyethylene glycol (MIRALAX) packet Take 17 g by mouth daily as needed. 10/19/17  Yes Aline August, MD  pravastatin (PRAVACHOL) 20 MG tablet Take 20 mg by mouth daily.   Yes [provider]  sodium chloride 1 g tablet Take 1 g by mouth 3 (three) times daily with meals.    Yes [provider]  sucralfate (CARAFATE) 1 g tablet Take 1 g by mouth 4 (four) times daily.   Yes [provider]  traZODone (DESYREL) 50 MG tablet Take 50 mg by mouth at bedtime.   Yes [provider]  dicyclomine (BENTYL) 20 MG tablet Take 1 tablet (20 mg total) by mouth 2 (two) times daily. 11/04/17   Recardo Evangelist, PA-C    Family History No family history on file.  Social History Social History   Tobacco Use  . Smoking status: Current Every Day Smoker    Packs/day: 0.25    Types: Cigarettes  . Smokeless tobacco: Never Used  Substance Use Topics  . Alcohol use: No  . Drug use: No     Allergies   Clarithromycin and Penicillins   Review of Systems Review of Systems  Unable to perform ROS: Acuity of condition  Musculoskeletal: Positive for arthralgias, gait problem and myalgias.   Physical Exam Updated Vital Signs BP (!)  190/83 (BP Location: Left Arm)   Pulse 72   Temp 98.3 F (36.8 C)   Resp 19   SpO2 100%   Physical Exam  Constitutional: No distress.  HENT:  Head: Normocephalic.  Eyes: Conjunctivae are normal.  Neck: Neck  supple.  Cardiovascular: Normal rate, regular rhythm, normal heart sounds and intact distal pulses. Exam reveals no gallop and no friction rub.  No murmur heard. Pulmonary/Chest: Effort normal. No stridor. No respiratory distress. She has no wheezes. She has no rales.  Lungs are clear to auscultation bilaterally.  Abdominal: Soft. Bowel sounds are normal. She exhibits no distension and no mass. There is no tenderness. There is no rebound and no guarding. No hernia.  Protuberant abdomen.  Musculoskeletal: She exhibits tenderness. She exhibits no edema.  Tender to palpation over the right hip and proximal thigh.  Decreased strength against resistance on the right lower extremity secondary to pain.  DP pulses are 2+ and symmetric.  Sensation is intact throughout the bilateral lower extremities.  Right leg is externally rotated.  Neurological: She is alert.  Patient states that May or June 2019.  She is oriented to place and that she knows that it is [facility].  She is also oriented to self.  Skin: Skin is warm. No rash noted.  Psychiatric: Her behavior is normal.  Nursing note and vitals reviewed.    ED Treatments / Results  Labs (all labs ordered are listed, but only abnormal results are displayed) Labs Reviewed  BASIC METABOLIC PANEL - Abnormal; Notable for the following components:      Result Value   Sodium 123 (*)    Chloride 85 (*)    Glucose, Bld 138 (*)    All other components within normal limits  CBC WITH DIFFERENTIAL/PLATELET - Abnormal; Notable for the following components:   WBC 11.5 (*)    Neutro Abs 9.4 (*)    All other components within normal limits  BRAIN NATRIURETIC PEPTIDE - Abnormal; Notable for the following  components:   B Natriuretic Peptide 206.8 (*)    All other components within normal limits  TROPONIN I - Abnormal; Notable for the following components:   Troponin I 0.03 (*)    All other components within normal limits  BASIC METABOLIC PANEL - Abnormal; Notable for the following components:   Sodium 124 (*)    Chloride 87 (*)    Glucose, Bld 119 (*)    Calcium 8.7 (*)    All other components within normal limits  BASIC METABOLIC PANEL - Abnormal; Notable for the following components:   Sodium 124 (*)    Chloride 89 (*)    Glucose, Bld 115 (*)    Calcium 8.6 (*)    All other components within normal limits  GLUCOSE, CAPILLARY - Abnormal; Notable for the following components:   Glucose-Capillary 102 (*)    All other components within normal limits  CBG MONITORING, ED - Abnormal; Notable for the following components:   Glucose-Capillary 126 (*)    All other components within normal limits  URINE CULTURE  PROTIME-INR  HEMOGLOBIN F7P  BASIC METABOLIC PANEL  BASIC METABOLIC PANEL  CBC  I-STAT BETA HCG BLOOD, ED ([facility], [facility], [facility] ONLY)  TYPE AND SCREEN  ABO/RH    EKG EKG Interpretation  Date/Time:  Thursday Nov 07 2017 09:03:22 EDT Ventricular Rate:  84 PR Interval:    QRS Duration: 87 QT Interval:  385 QTC Calculation: 456 R Axis:   72 Text Interpretation:  Normal sinus rhythm No significant change was found Confirmed by Jola Schmidt [phone]) on 11/07/2017 9:22:04 AM   Radiology Dg Chest 1 View  Result Date: 11/07/2017 CLINICAL DATA:  Status post fall, with concern for chest injury. Initial encounter.  EXAM: CHEST  1 VIEW COMPARISON:  Chest radiograph performed 11/04/2017 FINDINGS: The lungs are well-aerated. Mild vascular congestion is noted. Peribronchial thickening is seen. Chronically increased interstitial markings are noted. There is no evidence of pleural effusion or pneumothorax. The cardiomediastinal silhouette is within normal limits. No acute osseous abnormalities are  seen. IMPRESSION: Mild vascular congestion noted. Peribronchial thickening seen. Chronically increased interstitial markings noted. No displaced rib fractures identified. Electronically Signed   By: Garald Balding M.D.   On: 11/07/2017 06:47   Dg Hip Unilat  With Pelvis 2-3 Views Right  Result Date: 11/07/2017 CLINICAL DATA:  Status post fall, with right hip pain, acute onset. Initial encounter. EXAM: DG HIP (WITH OR WITHOUT PELVIS) 2-3V RIGHT COMPARISON:  Right hip radiographs performed 07/31/2017 FINDINGS: There is a comminuted right femoral intertrochanteric fracture, with mild medial rotation of the distal femur. The right femoral head remains seated at the acetabulum. The left hip joint is unremarkable. There appears to be healing chronic fractures of the right superior and inferior pubic rami. Postoperative changes are noted at the sacrum bilaterally. The visualized bowel gas pattern is grossly unremarkable. IMPRESSION: 1. Comminuted right femoral intertrochanteric fracture, with mild medial rotation of the distal femur. 2. Healing chronic fractures of the right superior and inferior pubic rami. Electronically Signed   By: Garald Balding M.D.   On: 11/07/2017 06:46    Procedures Procedures (including critical care time)  Medications Ordered in ED Medications  carvedilol (COREG) tablet 25 mg (25 mg Oral Given 11/07/17 1250)  divalproex (DEPAKOTE) DR tablet 250 mg (250 mg Oral Given 11/07/17 0909)  hydrALAZINE (APRESOLINE) tablet 100 mg (100 mg Oral Given 11/07/17 1505)  ALPRAZolam (XANAX) tablet 0.5 mg (0.5 mg Oral Given 11/07/17 1505)  aspirin EC tablet 81 mg (81 mg Oral Given 11/07/17 1252)  buPROPion (WELLBUTRIN XL) 24 hr tablet 300 mg (300 mg Oral Given 11/07/17 1256)  cloNIDine (CATAPRES - Dosed in mg/24 hr) patch 0.3 mg (0.3 mg Transdermal Not Given 11/07/17 1454)  dextromethorphan-guaiFENesin (MUCINEX DM) 30-600 MG per 12 hr tablet 1 tablet (has no administration in time range)  dicyclomine  (BENTYL) tablet 20 mg (20 mg Oral Not Given 11/07/17 0900)  ferrous sulfate tablet 325 mg (has no administration in time range)  fluticasone (FLONASE) 50 MCG/ACT nasal spray 2 spray (has no administration in time range)  fluticasone furoate-vilanterol (BREO ELLIPTA) 100-25 MCG/INH 1 puff (has no administration in time range)  guaifenesin (ROBITUSSIN) 100 MG/5ML syrup 200 mg (has no administration in time range)  hydrOXYzine (ATARAX/VISTARIL) tablet 25 mg (has no administration in time range)  ipratropium-albuterol (DUONEB) 0.5-2.5 (3) MG/3ML nebulizer solution 3 mL (has no administration in time range)  lurasidone (LATUDA) tablet 80 mg (has no administration in time range)  magnesium hydroxide (MILK OF MAGNESIA) suspension 30 mL (has no administration in time range)  Melatonin TABS 9 mg (has no administration in time range)  NIFEdipine (PROCARDIA XL/ADALAT-CC) 24 hr tablet 90 mg (has no administration in time range)  pantoprazole (PROTONIX) EC tablet 40 mg (40 mg Oral Given 11/07/17 1257)  PARoxetine (PAXIL) tablet 20 mg (has no administration in time range)  pravastatin (PRAVACHOL) tablet 20 mg (20 mg Oral Given 11/07/17 1257)  sucralfate (CARAFATE) tablet 1 g (1 g Oral Given 11/07/17 1505)  traZODone (DESYREL) tablet 50 mg (has no administration in time range)  0.9 %  sodium chloride infusion ( Intravenous New Bag/Given 11/07/17 1407)  acetaminophen (TYLENOL) tablet 650 mg (has no administration in time range)  Or  acetaminophen (TYLENOL) suppository 650 mg (has no administration in time range)  HYDROcodone-acetaminophen (NORCO/VICODIN) 5-325 MG per tablet 1-2 tablet (has no administration in time range)  ondansetron (ZOFRAN) tablet 4 mg (has no administration in time range)    Or  ondansetron (ZOFRAN) injection 4 mg (has no administration in time range)  enoxaparin (LOVENOX) injection 40 mg (has no administration in time range)  insulin aspart (novoLOG) injection 0-9 Units (1 Units  Subcutaneous Given 11/07/17 1248)  morphine 4 MG/ML injection 1 mg (1 mg Intravenous Given 11/07/17 1522)  senna-docusate (Senokot-S) tablet 1 tablet (has no administration in time range)  polycarbophil (FIBERCON) tablet 625 mg (has no administration in time range)  0.9 %  sodium chloride infusion ( Intravenous Stopped 11/07/17 0910)     Initial Impression / Assessment and Plan / ED Course  I have reviewed the triage vital signs and the nursing notes.  Pertinent labs & imaging results that were available during my care of the patient were reviewed by me and considered in my medical decision making (see chart for details).     57 year old female with a history of CHF, COPD on 2L [place], DM type II, HTN, schizoaffective disorder who presents to the emergency department from [place] by EMS with a chief complaint of unwitnessed fall.  She has right hip pain.  Right hip x-ray demonstrates a comminuted right femoral intertrochanteric fracture with mild medial rotation of the distal femur.  Healing chronic fractures of the right superior and inferior pubic rami are also noted.  She is neurovascularly intact on exam.  Consulted orthopedic surgery and PA Jacqulynn Cadet will see the patient.  Chest x-ray with mild vascular congestion.  EKG with normal sinus rhythm, no significant changes from previous.  Labs are notable for hyponatremia of 123.  Infusion of normal saline initiated at 671 mL's per hour based on calculation with sodium correction rate and hyponatremia.  Labs are otherwise reassuring.  Consulted the hospitalist team since the patient had been discharged less than 30 days ago by their service.  Spoke with PA [facility] who will accept the admission. The patient appears reasonably stabilized for admission considering the current resources, flow, and capabilities available in the ED at this time, and I doubt any other [facility] requiring further screening and/or treatment in the ED prior to admission.  Final  Clinical Impressions(s) / ED Diagnoses   Final diagnoses:  Closed right hip fracture, initial encounter [facility])  Hyponatremia    ED Discharge Orders    None       Joanne Gavel, PA-C 11/07/17 1727    Jola Schmidt, MD 11/08/17 1729

## 2017-11-07 NOTE — ED Triage Notes (Addendum)
Pt arrived by [facility] from [facility] after a fall. Facility unsure how pt fell but pt was found lying on the floor, c/o R hip pain and has skin tear to R elbow. Pulses present to R foot, rotation outward and shortening noted

## 2017-11-07 NOTE — ED Notes (Signed)
Care assumed at this time - pt resting supine on stretcher - interm moaning in pain; MD aware - will order meds for pain control

## 2017-11-08 ENCOUNTER — Ambulatory Visit: Payer: Self-pay | Admitting: Interventional Cardiology

## 2017-11-08 ENCOUNTER — Encounter (HOSPITAL_COMMUNITY): Payer: Self-pay | Admitting: Certified Registered"

## 2017-11-08 ENCOUNTER — Inpatient Hospital Stay (HOSPITAL_COMMUNITY): Payer: Medicare Other

## 2017-11-08 DIAGNOSIS — G9340 Encephalopathy, unspecified: Secondary | ICD-10-CM

## 2017-11-08 DIAGNOSIS — D649 Anemia, unspecified: Secondary | ICD-10-CM

## 2017-11-08 DIAGNOSIS — J9601 Acute respiratory failure with hypoxia: Secondary | ICD-10-CM

## 2017-11-08 DIAGNOSIS — J9611 Chronic respiratory failure with hypoxia: Secondary | ICD-10-CM

## 2017-11-08 DIAGNOSIS — J810 Acute pulmonary edema: Secondary | ICD-10-CM

## 2017-11-08 DIAGNOSIS — S72141P Displaced intertrochanteric fracture of right femur, subsequent encounter for closed fracture with malunion: Secondary | ICD-10-CM

## 2017-11-08 DIAGNOSIS — E785 Hyperlipidemia, unspecified: Secondary | ICD-10-CM

## 2017-11-08 LAB — POCT I-STAT 3, ART BLOOD GAS (G3+)
ACID-BASE EXCESS: 2 mmol/L (ref 0.0–2.0)
ACID-BASE EXCESS: 3 mmol/L — AB (ref 0.0–2.0)
Bicarbonate: 27.3 mmol/L (ref 20.0–28.0)
Bicarbonate: 28.8 mmol/L — ABNORMAL HIGH (ref 20.0–28.0)
O2 SAT: 94 %
O2 SAT: 91 %
PH ART: 7.41 (ref 7.350–7.450)
PH ART: 7.414 (ref 7.350–7.450)
TCO2: 29 mmol/L (ref 22–32)
TCO2: 30 mmol/L (ref 22–32)
pCO2 arterial: 43 mmHg (ref 32.0–48.0)
pCO2 arterial: 44.8 mmHg (ref 32.0–48.0)
pO2, Arterial: 71 mmHg — ABNORMAL LOW (ref 83.0–108.0)
pO2, Arterial: 58 mmHg — ABNORMAL LOW (ref 83.0–108.0)

## 2017-11-08 LAB — LACTIC ACID, PLASMA: Lactic Acid, Venous: 0.8 mmol/L (ref 0.5–1.9)

## 2017-11-08 LAB — GLUCOSE, CAPILLARY
GLUCOSE-CAPILLARY: 104 mg/dL — AB (ref 65–99)
GLUCOSE-CAPILLARY: 120 mg/dL — AB (ref 65–99)
Glucose-Capillary: 117 mg/dL — ABNORMAL HIGH (ref 65–99)
Glucose-Capillary: 116 mg/dL — ABNORMAL HIGH (ref 65–99)

## 2017-11-08 LAB — BASIC METABOLIC PANEL
ANION GAP: 9 (ref 5–15)
ANION GAP: 10 (ref 5–15)
BUN: 11 mg/dL (ref 6–20)
BUN: 10 mg/dL (ref 6–20)
CALCIUM: 8.5 mg/dL — AB (ref 8.9–10.3)
CALCIUM: 8.3 mg/dL — AB (ref 8.9–10.3)
CO2: 27 mmol/L (ref 22–32)
CO2: 28 mmol/L (ref 22–32)
Chloride: 89 mmol/L — ABNORMAL LOW (ref 101–111)
Chloride: 88 mmol/L — ABNORMAL LOW (ref 101–111)
Creatinine, Ser: 0.63 mg/dL (ref 0.44–1.00)
Creatinine, Ser: 0.62 mg/dL (ref 0.44–1.00)
GFR calc Af Amer: >60 mL/min (ref >60)
GFR calc Af Amer: >60 mL/min (ref >60)
GFR calc non Af Amer: >60 mL/min (ref >60)
GLUCOSE: 89 mg/dL (ref 65–99)
GLUCOSE: 105 mg/dL — AB (ref 65–99)
POTASSIUM: 3.7 mmol/L (ref 3.5–5.1)
Potassium: 3.4 mmol/L — ABNORMAL LOW (ref 3.5–5.1)
SODIUM: 126 mmol/L — AB (ref 135–145)
Sodium: 125 mmol/L — ABNORMAL LOW (ref 135–145)

## 2017-11-08 LAB — CBC
HCT: 29.4 % — ABNORMAL LOW (ref 36.0–46.0)
HEMOGLOBIN: 9.8 g/dL — AB (ref 12.0–15.0)
MCH: 29 pg (ref 26.0–34.0)
MCHC: 33.3 g/dL (ref 30.0–36.0)
MCV: 87 fL (ref 78.0–100.0)
Platelets: 138 10*3/uL — ABNORMAL LOW (ref 150–400)
RBC: 3.38 MIL/uL — ABNORMAL LOW (ref 3.87–5.11)
RDW: 14 % (ref 11.5–15.5)
WBC: 7.2 10*3/uL (ref 4.0–10.5)

## 2017-11-08 LAB — SODIUM: SODIUM: 127 mmol/L — AB (ref 135–145)

## 2017-11-08 LAB — BRAIN NATRIURETIC PEPTIDE: B Natriuretic Peptide: 48.5 pg/mL (ref 0.0–100.0)

## 2017-11-08 LAB — OSMOLALITY, URINE: Osmolality, Ur: 257 mOsm/kg — ABNORMAL LOW (ref 300–900)

## 2017-11-08 LAB — NA AND K (SODIUM & POTASSIUM), RAND UR
Potassium Urine: 23 mmol/L
Sodium, Ur: 33 mmol/L

## 2017-11-08 LAB — SURGICAL PCR SCREEN
MRSA, PCR: NEGATIVE
Staphylococcus aureus: POSITIVE — AB

## 2017-11-08 LAB — OSMOLALITY: Osmolality: 260 mOsm/kg — ABNORMAL LOW (ref 275–295)

## 2017-11-08 MED ORDER — HALOPERIDOL LACTATE 5 MG/ML IJ SOLN
INTRAMUSCULAR | Status: AC
  Administered 2017-11-08: 5 mg via INTRAVENOUS
  Filled 2017-11-08: qty 1

## 2017-11-08 MED ORDER — IPRATROPIUM-ALBUTEROL 0.5-2.5 (3) MG/3ML IN SOLN
3.0000 mL | RESPIRATORY_TRACT | Status: DC
  Administered 2017-11-08 – 2017-11-14 (×34): 3 mL via RESPIRATORY_TRACT
  Filled 2017-11-08 (×35): qty 3

## 2017-11-08 MED ORDER — MIDAZOLAM HCL 2 MG/2ML IJ SOLN: 2.0000 mg | INTRAMUSCULAR | Status: DC | PRN

## 2017-11-08 MED ORDER — FENTANYL CITRATE (PF) 100 MCG/2ML IJ SOLN: 100.0000 ug | INTRAMUSCULAR | Status: DC | PRN

## 2017-11-08 MED ORDER — FUROSEMIDE 10 MG/ML IJ SOLN
40.0000 mg | Freq: Once | INTRAMUSCULAR | Status: AC
  Administered 2017-11-08: 40 mg via INTRAVENOUS
  Filled 2017-11-08: qty 4

## 2017-11-08 MED ORDER — LORAZEPAM 2 MG/ML IJ SOLN
INTRAMUSCULAR | Status: AC
  Filled 2017-11-08: qty 1

## 2017-11-08 MED ORDER — DOCUSATE SODIUM 50 MG/5ML PO LIQD: 100.0000 mg | Freq: Two times a day (BID) | ORAL | Status: DC | PRN

## 2017-11-08 MED ORDER — CLONIDINE HCL 0.2 MG PO TABS
0.2000 mg | ORAL_TABLET | Freq: Three times a day (TID) | ORAL | Status: DC
  Filled 2017-11-08: qty 1

## 2017-11-08 MED ORDER — FENTANYL CITRATE (PF) 100 MCG/2ML IJ SOLN
100.0000 ug | Freq: Once | INTRAMUSCULAR | Status: DC
  Filled 2017-11-08: qty 2

## 2017-11-08 MED ORDER — METOPROLOL TARTRATE 5 MG/5ML IV SOLN: 2.5000 mg | INTRAVENOUS | Status: DC | PRN

## 2017-11-08 MED ORDER — BUDESONIDE 0.5 MG/2ML IN SUSP
0.5000 mg | Freq: Two times a day (BID) | RESPIRATORY_TRACT | Status: DC
  Administered 2017-11-08 – 2017-11-20 (×23): 0.5 mg via RESPIRATORY_TRACT
  Filled 2017-11-08 (×26): qty 2

## 2017-11-08 MED ORDER — FUROSEMIDE 10 MG/ML IJ SOLN
40.0000 mg | Freq: Once | INTRAMUSCULAR | Status: AC
  Administered 2017-11-08: 40 mg via INTRAVENOUS

## 2017-11-08 MED ORDER — ARFORMOTEROL TARTRATE 15 MCG/2ML IN NEBU
15.0000 ug | INHALATION_SOLUTION | Freq: Two times a day (BID) | RESPIRATORY_TRACT | Status: DC
  Administered 2017-11-08 – 2017-11-20 (×23): 15 ug via RESPIRATORY_TRACT
  Filled 2017-11-08 (×25): qty 2

## 2017-11-08 MED ORDER — POTASSIUM CHLORIDE CRYS ER 20 MEQ PO TBCR
40.0000 meq | EXTENDED_RELEASE_TABLET | Freq: Three times a day (TID) | ORAL | Status: DC
  Administered 2017-11-08: 40 meq via ORAL
  Filled 2017-11-08: qty 2

## 2017-11-08 MED ORDER — ASPIRIN 81 MG PO CHEW: 81.0000 mg | CHEWABLE_TABLET | Freq: Every day | ORAL | Status: DC

## 2017-11-08 MED ORDER — PANTOPRAZOLE SODIUM 40 MG PO PACK
40.0000 mg | PACK | Freq: Every day | ORAL | Status: DC
  Filled 2017-11-08: qty 20

## 2017-11-08 MED ORDER — HALOPERIDOL LACTATE 5 MG/ML IJ SOLN
5.0000 mg | Freq: Once | INTRAMUSCULAR | Status: AC
  Administered 2017-11-08: 5 mg via INTRAVENOUS

## 2017-11-08 MED ORDER — FERROUS SULFATE 300 (60 FE) MG/5ML PO SYRP
300.0000 mg | ORAL_SOLUTION | Freq: Every day | ORAL | Status: DC
  Filled 2017-11-08: qty 5

## 2017-11-08 MED ORDER — IOPAMIDOL (ISOVUE-370) INJECTION 76%
100.0000 mL | Freq: Once | INTRAVENOUS | Status: AC | PRN
  Administered 2017-11-09: 100 mL via INTRAVENOUS

## 2017-11-08 MED ORDER — BUPROPION HCL 100 MG PO TABS
100.0000 mg | ORAL_TABLET | Freq: Three times a day (TID) | ORAL | Status: DC
  Filled 2017-11-08 (×2): qty 1

## 2017-11-08 MED ORDER — VALPROIC ACID 250 MG/5ML PO SOLN: 250.0000 mg | Freq: Two times a day (BID) | ORAL | Status: DC

## 2017-11-08 MED ORDER — MUPIROCIN 2 % EX OINT: 1.0000 "application " | TOPICAL_OINTMENT | Freq: Two times a day (BID) | CUTANEOUS | Status: DC

## 2017-11-08 MED ORDER — ROCURONIUM BROMIDE 50 MG/5ML IV SOLN
1.0000 mg/kg | Freq: Once | INTRAVENOUS | Status: DC
  Filled 2017-11-08: qty 8.79

## 2017-11-08 MED ORDER — BISACODYL 10 MG RE SUPP: 10.0000 mg | Freq: Every day | RECTAL | Status: DC | PRN

## 2017-11-08 MED ORDER — MUPIROCIN 2 % EX OINT
1.0000 "application " | TOPICAL_OINTMENT | Freq: Two times a day (BID) | CUTANEOUS | Status: AC
  Administered 2017-11-08 – 2017-11-12 (×10): 1 via NASAL
  Filled 2017-11-08 (×2): qty 22

## 2017-11-08 MED ORDER — INSULIN ASPART 100 UNIT/ML ~~LOC~~ SOLN
1.0000 [IU] | SUBCUTANEOUS | Status: DC
  Administered 2017-11-09: 2 [IU] via SUBCUTANEOUS
  Administered 2017-11-10 – 2017-11-13 (×13): 1 [IU] via SUBCUTANEOUS
  Administered 2017-11-13: 2 [IU] via SUBCUTANEOUS
  Administered 2017-11-13: 1 [IU] via SUBCUTANEOUS
  Administered 2017-11-14: 3 [IU] via SUBCUTANEOUS
  Administered 2017-11-14: 2 [IU] via SUBCUTANEOUS
  Administered 2017-11-14 (×2): 1 [IU] via SUBCUTANEOUS
  Administered 2017-11-14: 3 [IU] via SUBCUTANEOUS
  Administered 2017-11-15 (×2): 1 [IU] via SUBCUTANEOUS

## 2017-11-08 MED ORDER — LORAZEPAM 2 MG/ML IJ SOLN
1.0000 mg | Freq: Once | INTRAMUSCULAR | Status: AC
  Administered 2017-11-08: 1 mg via INTRAVENOUS
  Filled 2017-11-08: qty 1

## 2017-11-08 MED ORDER — ETOMIDATE 2 MG/ML IV SOLN
0.3000 mg/kg | Freq: Once | INTRAVENOUS | Status: DC
  Filled 2017-11-08: qty 20
  Filled 2017-11-08: qty 13.19

## 2017-11-08 MED ORDER — FUROSEMIDE 10 MG/ML IJ SOLN
INTRAMUSCULAR | Status: AC
  Filled 2017-11-08: qty 4

## 2017-11-08 MED ORDER — ORAL CARE MOUTH RINSE: 15.0000 mL | Freq: Two times a day (BID) | OROMUCOSAL | Status: DC

## 2017-11-08 MED ORDER — MIDAZOLAM HCL 2 MG/2ML IJ SOLN
2.0000 mg | INTRAMUSCULAR | Status: DC | PRN
  Filled 2017-11-08: qty 2

## 2017-11-08 MED ORDER — CHLORHEXIDINE GLUCONATE CLOTH 2 % EX PADS
6.0000 | MEDICATED_PAD | Freq: Every day | CUTANEOUS | Status: AC
  Administered 2017-11-08 – 2017-11-12 (×5): 6 via TOPICAL

## 2017-11-08 MED ORDER — FUROSEMIDE 10 MG/ML IJ SOLN
40.0000 mg | Freq: Four times a day (QID) | INTRAMUSCULAR | Status: DC
  Administered 2017-11-08: 40 mg via INTRAVENOUS
  Filled 2017-11-08 (×2): qty 4

## 2017-11-08 MED ORDER — MIDAZOLAM HCL 2 MG/2ML IJ SOLN
2.0000 mg | Freq: Once | INTRAMUSCULAR | Status: DC
  Filled 2017-11-08: qty 2

## 2017-11-08 NOTE — Consult Note (Signed)
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  Patient is confused, on BiPAP with stable vitals and ABG pending.  WBC is normal.  I reviewed CXR myself, pulmonary edema noted.   PAST MEDICAL HISTORY :  She  has a past medical history of Acute on chronic respiratory failure with hypoxia and hypercapnia (HCC) (10/07/2017), Anemia (10/16/2017), Anxiety and depression, CHF (congestive heart failure) ([place]), Chronic hyponatremia (04/08/2007), Chronic respiratory failure with hypoxia ([place]) (10/07/2017), Closed comminuted intertrochanteric fracture of right femur ([place]) (11/07/2017), COPD (04/08/2007), Cough (05/08/2011), Depression, Diabetes mellitus, type 2 ([place]), Dyspnea (10/16/2017), Dysuria (04/08/2007), Essential hypertension (04/08/2007), GERD (gastroesophageal reflux disease), HIP PAIN, LEFT (12/12/2007), Hyperlipidemia (10/07/2017), Hypertension, Hyponatremia (07/27/2011), Incontinence of urine (08/14/2011), Lumbar disc disease (04/09/2011), MENOPAUSE, PREMATURE (04/08/2007), NEOPLASM, MALIGNANT, VULVA (04/08/2007), Osteoporosis, Osteoporosis (04/08/2007), Polycythemia secondary to smoking (04/09/2011), S/P right hip fracture (11/07/2017), and Schizoaffective disorder, bipolar type ([place]) (07/05/2011).  PAST SURGICAL HISTORY: She  has a past surgical history that includes Pelvic fracture surgery and Eye surgery.  Allergies  Allergen Reactions  . Clarithromycin Itching  . Penicillins Itching    Has patient  had a PCN reaction causing immediate rash, facial/tongue/throat swelling, SOB or lightheadedness with hypotension: No Has patient had a PCN reaction causing severe rash involving mucus membranes or skin necrosis: No Has patient had a PCN reaction that required hospitalization: Unknown Has patient had a PCN reaction occurring within the last 10 years: No If all of the above answers are "NO", then may proceed with Cephalosporin use.     No current facility-administered medications on file prior to encounter.    Current Outpatient Medications on File Prior to Encounter  Medication Sig  . acetaminophen (TYLENOL) 325 MG tablet Take 650 mg by mouth every 6 (six) hours as needed for mild pain.  Marland Kitchen albuterol (PROVENTIL HFA;VENTOLIN HFA) 108 (90 Base) MCG/ACT inhaler Inhale 2 puffs into the lungs every 4 (four) hours as needed for wheezing or shortness of breath.  . ALPRAZolam (XANAX) 0.5 MG tablet Take 1 tablet (0.5 mg total) by mouth 3 (three) times daily.  Marland Kitchen alum & mag hydroxide-simeth (MINTOX) 790-240-97 MG/5ML suspension Take 30 mLs by mouth every 6 (six) hours as needed for indigestion or heartburn.  Marland Kitchen aspirin 81 MG tablet Take 1 tablet (81 mg total) by mouth daily. For heart health  . buPROPion (WELLBUTRIN XL) 300 MG 24 hr tablet Take 1 tablet (300 mg total) by mouth every morning. (Patient taking differently: Take 300 mg by mouth daily. )  . carvedilol (COREG) 25 MG tablet Take 25 mg by mouth 2 (two) times daily with a meal.  . cloNIDine (CATAPRES - DOSED IN MG/24 HR) 0.3 mg/24hr patch Place 0.3 mg onto the skin once a week. On Monday  . dextromethorphan-guaiFENesin (MUCINEX DM) 30-600 MG 12hr tablet Take 1 tablet by mouth every 12 (twelve) hours as needed for cough.  . divalproex (DEPAKOTE) 250 MG DR tablet Take 1 tablet (250 mg total) by mouth every 12 (twelve) hours.  Marland Kitchen  ferrous sulfate 325 (65 FE) MG tablet Take 325 mg by mouth daily with breakfast.  . fluticasone (FLONASE) 50 MCG/ACT nasal  spray Place 2 sprays into both nostrils 2 (two) times daily.  . fluticasone furoate-vilanterol (BREO ELLIPTA) 100-25 MCG/INH AEPB Inhale 1 puff into the lungs daily.  Marland Kitchen guaifenesin (ROBITUSSIN) 100 MG/5ML syrup Take 200 mg by mouth 3 (three) times daily as needed for cough.  . hydrALAZINE (APRESOLINE) 100 MG tablet Take 100 mg by mouth every 8 (eight) hours.  . hydrOXYzine (ATARAX/VISTARIL) 25 MG tablet Take 1 tablet (25 mg total) by mouth 3 (three) times daily as needed for anxiety.  Marland Kitchen ipratropium-albuterol (DUONEB) 0.5-2.5 (3) MG/3ML SOLN Take 3 mLs by nebulization 3 (three) times daily as needed (SOb).  . lurasidone (LATUDA) 80 MG TABS tablet Take 1 tablet (80 mg total) by mouth daily with breakfast. (Patient taking differently: Take 40 mg by mouth daily with breakfast. )  . magnesium hydroxide (MILK OF MAGNESIA) 400 MG/5ML suspension Take 30 mLs by mouth at bedtime as needed for mild constipation.  . magnesium oxide (MAG-OX) 400 (241.3 Mg) MG tablet Take 2 tablets (800 mg total) by mouth 2 (two) times daily.  . Melatonin 10 MG TABS Take 10 mg by mouth at bedtime.  . Menthol, Topical Analgesic, (BIOFREEZE COLORLESS EX) Apply 1 application topically 3 (three) times daily. TO UPPER ABDOMEN  . Methylcellulose, Laxative, 500 MG TABS Take 500 mg by mouth at bedtime.  . Multiple Vitamin (DAILY-VITE PO) Take 1 tablet by mouth daily.  Marland Kitchen neomycin-bacitracin-polymyxin (NEOSPORIN) OINT Apply 1 application topically daily.   Marland Kitchen NIFEdipine (PROCARDIA XL/ADALAT-CC) 90 MG 24 hr tablet Take 90 mg by mouth daily.  Marland Kitchen omeprazole (PRILOSEC) 20 MG capsule Take 1 capsule (20 mg total) by mouth 2 (two) times daily before a meal. For acid reflux.  Marland Kitchen oxyCODONE (OXY IR/ROXICODONE) 5 MG immediate release tablet Take 1 tablet (5 mg total) by mouth every 6 (six) hours as needed for severe pain.  Marland Kitchen PARoxetine (PAXIL) 20 MG tablet Take 1 tablet (20 mg total) by mouth daily.  . polyethylene glycol (MIRALAX) packet Take 17 g by  mouth daily as needed.  . pravastatin (PRAVACHOL) 20 MG tablet Take 20 mg by mouth daily.  . sodium chloride 1 g tablet Take 1 g by mouth 3 (three) times daily with meals.   . sucralfate (CARAFATE) 1 g tablet Take 1 g by mouth 4 (four) times daily.  . traZODone (DESYREL) 50 MG tablet Take 50 mg by mouth at bedtime.  . dicyclomine (BENTYL) 20 MG tablet Take 1 tablet (20 mg total) by mouth 2 (two) times daily.    FAMILY HISTORY:  Her has no family status information on file.    SOCIAL HISTORY: She  reports that she has been smoking cigarettes.  She has been smoking about 0.25 packs per day. She has never used smokeless tobacco. She reports that she does not drink alcohol or use drugs.  REVIEW OF SYSTEMS:   Patient is confused on BiPAP  SUBJECTIVE:  Asking for BiPAP off.  VITAL SIGNS: BP 101/85   Pulse 86   Temp 97.9 F (36.6 C) (Axillary)   Resp 19   Ht 5\' 4"  (1.626 m)   Wt 193 lb 12.6 oz (87.9 kg)   SpO2 94%   BMI 33.26 kg/m   HEMODYNAMICS:    VENTILATOR SETTINGS: FiO2 (%):  [100 %] 100 %  INTAKE / OUTPUT: I/O last 3 completed shifts: In: 240 [P.O.:240] Out: -  PHYSICAL EXAMINATION: General:  Acute on chronically ill female, mild respiratory distress Neuro:  Awake but confused, moving all ext to command. HEENT:  /AT, PERRL, EOM-I and MMM Cardiovascular:  RRR, Nl S1/S2 and -M/R/G Lungs:  Diffuse crackles. Abdomen:  Soft, NT, ND and +BS Musculoskeletal:  -edema and -tenderness Skin:  Intact  LABS:  BMET Recent Labs  Lab 11/07/17 1414 11/07/17 2215 11/08/17 0337 11/08/17 0803  NA 124* 123* 125* 127*  K 4.0 4.0 3.7  --   CL 89* 89* 89*  --   CO2 26 26 27   --   BUN 8 9 11   --   CREATININE 0.51 0.53 0.63  --   GLUCOSE 115* 96 89  --     Electrolytes Recent Labs  Lab 11/07/17 1414 11/07/17 2215 11/08/17 0337  CALCIUM 8.6* 8.3* 8.5*    CBC Recent Labs  Lab 11/04/17 1934 11/04/17 1938 11/07/17 0550 11/08/17 0337  WBC 6.3  --  11.5* 7.2   HGB 12.1 12.9 12.8 9.8*  HCT 38.1 38.0 38.0 29.4*  PLT 181  --  228 138*    Coag's Recent Labs  Lab 11/07/17 0550  INR 1.02    Sepsis Markers No results for input(s): LATICACIDVEN, PROCALCITON, O2SATVEN in the last 168 hours.  ABG No results for input(s): PHART, PCO2ART, PO2ART in the last 168 hours.  Liver Enzymes Recent Labs  Lab 11/04/17 1934  AST 16  ALT 15  ALKPHOS 73  BILITOT 0.1*  ALBUMIN 3.4*    Cardiac Enzymes Recent Labs  Lab 11/07/17 0850  TROPONINI 0.03*    Glucose Recent Labs  Lab 11/07/17 1211 11/07/17 1618 11/07/17 2120 11/08/17 0813 11/08/17 1205  GLUCAP 126* 102* 95 117* 116*    Imaging Dg Chest Port 1 View  Result Date: 11/08/2017 CLINICAL DATA:  Acute onset of respiratory distress. EXAM: PORTABLE CHEST 1 VIEW COMPARISON:  Chest radiograph performed 11/07/2017 FINDINGS: Lung expansion is mildly decreased. Patchy left-sided airspace opacity may reflect atelectasis or pneumonia. The right lung remains relatively clear. Mild vascular congestion is noted. No pleural effusion or pneumothorax is seen. The cardiomediastinal silhouette is borderline normal in size. No acute osseous abnormalities are identified. IMPRESSION: Patchy left-sided airspace opacity may reflect atelectasis or pneumonia. Mild vascular congestion noted. Electronically Signed   By: Garald Balding M.D.   On: 11/08/2017 06:34     STUDIES:  4/24 echo with normal EF and grade 1 diastolic dysfunction  CULTURES: Urine 5/16>>>  ANTIBIOTICS: None  SIGNIFICANT EVENTS: 5/17 transfer to the ICU for respiratory failure  LINES/TUBES: PIV  CXR consistent with pulmonary edema and cardiomegaly.  DISCUSSION: 57 year old female with history of psychogenic polydipsia that presents with acute on chronic hypoxemic respiratory failure requiring BiPAP.  PCCM consulted for vent management.  ASSESSMENT / PLAN:  PULMONARY A: Acute on chronic hypoxemic respiratory failure.   ABG  with alkalosis and severe hypoxemia P:   - BiPAP to PRN - 100% O2 - 2 additional doses of lasix 40 mg IV  - Continue bronchodilators as ordered - No need for steroids, do not believe this is a COPD exacerbation  CARDIOVASCULAR A:  Hypertension P:  - Hold anti-HTN - Lasix as ordered - Tele monitoring  RENAL A:   Hypokalemia P:   - Lasix 40 mg IV q6 x3 doses - KVO IVF - BMET in AM - Potassium replacement  GASTROINTESTINAL A:   No active issues P:   - NPO while on BiPAP - PPI  HEMATOLOGIC  A:   No active issues P:  - CBC in AM - Transfuse per ICU protocol  INFECTIOUS A:   No active concerns P:   - Monitor WBC and fever curve  ENDOCRINE A:   DM   P:   - CBG - ISS  NEUROLOGIC A:   AMS, schizophrenic, unknown baseline P:   RASS goal: N/A - Hold all sedation - ABG to insure no hypercarbia  FAMILY  - Updates: No family bedside, patient is encephalopathic.  Will change BiPAP to PRN at this point.  - Inter-disciplinary family meet or Palliative Care meeting due by:  day 7  The patient is critically ill with multiple organ systems failure and requires high complexity decision making for assessment and support, frequent evaluation and titration of therapies, application of advanced monitoring technologies and extensive interpretation of multiple databases.   Critical Care Time devoted to patient care services described in this note is  45  Minutes. This time reflects time of care of this signee Dr Jennet Maduro. This critical care time does not reflect procedure time, or teaching time or supervisory time of PA/NP/Med student/Med Resident etc but could involve care discussion time.  Rush Farmer, M.D. [facility] Pulmonary/Critical Care Medicine. Pager: [phone]. After hours pager: [phone].  11/08/2017, 2:18 PM

## 2017-11-08 NOTE — Progress Notes (Signed)
Late entry- RT Was in a procedure, per RN pt was calling/yelling on bipap.  RN placed pt on HFNC, sat now 94%.  No distress noted.

## 2017-11-08 NOTE — Progress Notes (Signed)
Pt belly-breathing, SpO2 86% on 15L NRB. Notified ELink, provider alerted to come to bedside to evaluate. Obtained an ABG, placed on BiPAP. Provider at bedside.

## 2017-11-08 NOTE — Progress Notes (Signed)
Pt refusing ABG, notified RN and MD

## 2017-11-08 NOTE — Progress Notes (Signed)
RN called d/t pt desat 80's% on NRB.  Pt placed on bipap, sat improving to 93%.  RN at bedside, pt appears to be tol well currently.

## 2017-11-08 NOTE — Progress Notes (Signed)
S/P ABG, MD states we can trial off bipap d/t pt yelling in pain that her "nose hurts".  Pt placed on NRB per MD request, sat 98%.  So far, pt tol well, no distress currently noted, sat 98% currently.

## 2017-11-08 NOTE — Progress Notes (Signed)
Pt SpO2 80's refusing to wear BiPAP mask, currently on NRB @ 15L. Notified ELink MD.

## 2017-11-08 NOTE — Significant Event (Signed)
Rapid Response Event Note  Overview:Called by RN d/t SpO2-70s on 3L [place] (pt SpO2 had been low 90s on 2L [place] t/o night).  Advised RN to placed pt on NRB. Time Called: 0555 Arrival Time: 0600 Event Type: Respiratory  Initial Focused Assessment: Pt laying in bed, alert, oriented, anxious.  SpO2-78% on NRB.  Lungs with rhonchi t/o, wheezes LLL.  RN gave albuterol tx PTA RRT. Pt given atarax and norco PTA RRT. BP-128/76, HR-70s, RR-upper 20s, SpO2-78 on NRB  Interventions: PCXR ordered and pending.  NP ordered 40mg  lasix, bipap and SDU tx.  Plan of Care (if not transferred): Transfer to 72M  Event Summary: Name of Physician Notified: Bodenheimer, NP at [place]    at    Outcome: Transferred (Comment)(28m)     Linna Darner, Carren Rang

## 2017-11-08 NOTE — Progress Notes (Signed)
PROGRESS NOTE    Patient: Natalie Thomas     PCP: Patient, No Pcp Per                    DOB: 10/27/1960            DOA: 11/07/2017 TGG:[id number]             DOS: 11/08/2017, 2:18 PM   LOS: 1 day   Date of Service: The patient was seen and examined on 11/08/2017  Subjective:   Patient was seen and examined this morning, in respiratory distress on BiPAP, mildly confused. Patient has had a rapid response called this morning as patient became hypoxic satting in low 70s. He also agitated, fighting the BiPAP. Respiratory therapist and nursing staff at bedside try to obtain an ABG but patient is not allowing to pursue. Intensivist Dr. Ardis Hughs has been notified, and consulted Will continue to monitor, possibly patient needs to be intubated and transferred to ICU.  Currently patient is awake, complaining of pain, satting approximately 92% on 100% FiO2, BiPAP Blood pressure 101/85 pulse 86 respiratory rate tachypneic, temp 97.9  ----------------------------------------------------------------------------------------------------------------------  Brief Narrative:   Natalie Thomas is a 57 y.o. female with history of CHF, COPD on home oxygen at 2 L nasal cannula, depression, diabetes type 2, GERD, hypertension, chronic hyponatremia baseline 131, bipolar disorder, depression, GERD, brought to the emergency department by EMS, after sustaining an unwitnessed fall.  The patient was found lying on the floor, unknown length.  The patient endorsed right hip pain immediately after being found.  Upon admission patient was confused, poor historian but denied having any chest pain.  Was reporting that she was thirsty.  Omitted that she takes a lot of ice chips and water often.  She was recently seen in the ED with complaint of abdominal pain felt to be secondary to constipation due to narcotics, and she had one prior admission from 4/24 through 10/19/2017 for enteritis and dyspnea, placed on Solu-Medrol,   At the time her sodium was 128, improved with IV fluids.  At time of discharge, she was in fairly stable clinical condition.  At the time, she was sent home with sodium tablets 1 g 3 times a day, unclear if she had been taking it .No new infections. No other reported falls   On admission patient was confused, in respiratory distress but awake no meaningful history was obtained.  Subsequently patient went to respiratory failure, hypoxia patient was placed on BiPAP rapid response was called.     Principal Problem:   Closed comminuted intertrochanteric fracture of right femur (HCC) Active Problems:   Chronic hyponatremia   Essential hypertension   GERD   Osteoporosis   Lumbar disc disease   Polycythemia secondary to smoking   Schizoaffective disorder, bipolar type (HCC)   Hyponatremia   Incontinence of urine   Hyperlipidemia   Chronic respiratory failure with hypoxia (HCC)   Anemia   Anxiety and depression   S/P right hip fracture   Assessment & Plan:   Acute respiratory failure/with history of COPD/COPD exacerbation, chronic hypoxia, O2 dependent 2 L at baseline -Status post rapid response this morning, patient has been placed on BiPAP, 40 mg of IV Lasix was given -Patient remains to be on BiPAP -ICU team, Dr. Ardis Hughs has been consulted, -Patient remains to be on 100% of FiO2, only satting 92%, confusion and agitation anticipating intubation and transfer if no improvement -Patient is confused agitated would not allow ABG to  be obtained -Chest x-ray was reviewed, positive for atelectasis, questionable infiltrate -Continue PRN DuoNeb bronchodilators, appreciate respiratory therapist assist  Acute on chronic hyponatremia -Possibly secondary to underlying psych disorder, polydipsia -Monitoring closely, patient is currently n.p.o., fluid restriction, normal saline was DC'd -Sodium on admission was 123 ---->127 -Proceed with SIADH work-up, TSH and cortisol levels -BP every 6  hours  Acute right hip fracture -Ortho following -Pending to be medically clearance for surgery, possible ORIF -Continue with pain management  Anemia of chronic disease -Monitoring H&H closely -Stable  Hyperlipidemia -Continue statins once tolerating p.o.  Diabetes mellitus type 2  -Recent A1c 6.3, -Checking blood sugar every 4 hours with SSI, as patient is n.p.o. Now   History of mood disorder/bipolar/?schizophrenia -Home medication fluids Latuda, Depakote, Paxil, Desyrel, Wellbutrin, Xanax -Medication will be to be monitored and resume accordingly  Recent history of enteritis  -Stable, no diarrhea  -Chronic laxatives due to chronic pain medication  Hypertension -We will monitor closely, will resume home medication of nifedipine, Coreg, hydralazine as patient becomes more stabilized -Note patient is not on Lasix at home.  DVT prophylaxis:  Lovenox Code Status:    Full  Family Communication:  Discussed with patient, ?  Understanding Disposition Plan: Expect patient to be discharged to home after condition improves Consults called:    Orthopedics by EDP Admission status:  Patient, stepdown/ICU     Consultants:   ICU  Procedures:    BiPAP  Objective: Vitals:   11/08/17 0900 11/08/17 1000 11/08/17 1100 11/08/17 1204  BP: (!) 139/98 101/85    Pulse: (!) 139 76 86   Resp: 17 19    Temp:    97.9 F (36.6 C)  TempSrc:    Axillary  SpO2: 98% 94%    Weight:      Height:        Intake/Output Summary (Last 24 hours) at 11/08/2017 1418 Last data filed at 11/08/2017 1200 Gross per 24 hour  Intake 240 ml  Output 1000 ml  Net -760 ml   Filed Weights   11/08/17 0500 11/08/17 0800  Weight: 90.1 kg (198 lb 9.6 oz) 87.9 kg (193 lb 12.6 oz)    Examination:  General exam: Agitated, confused on BiPAP HEENT: On BiPAP, otherwise within normal limits Respiratory system: Diffuse mild wheezing, poor air exchange noted Cardiovascular system: S1 & S2 heard, RRR. No  JVD, murmurs, rubs, gallops or clicks. No pedal edema. Gastrointestinal system: Abd. nondistended, soft and nontender. No organomegaly or masses felt. Normal bowel sounds heard. Central nervous system: Awake alert agitated, cranial nerves seems to be within normal limits, no asymmetric weaknesses, confused Extremities: Wearing all extremities Skin: No rashes, lesions or ulcers   Data Reviewed: I have personally reviewed following labs and imaging studies  CBC: Recent Labs  Lab 11/04/17 1934 11/04/17 1938 11/07/17 0550 11/08/17 0337  WBC 6.3  --  11.5* 7.2  NEUTROABS 3.8  --  9.4*  --   HGB 12.1 12.9 12.8 9.8*  HCT 38.1 38.0 38.0 29.4*  MCV 90.3  --  85.2 87.0  PLT 181  --  228 737*   Basic Metabolic Panel: Recent Labs  Lab 11/07/17 0550 11/07/17 0850 11/07/17 1414 11/07/17 2215 11/08/17 0337 11/08/17 0803  NA 123* 124* 124* 123* 125* 127*  K 4.0 4.2 4.0 4.0 3.7  --   CL 85* 87* 89* 89* 89*  --   CO2 27 24 26 26 27   --   GLUCOSE 138* 119* 115* 96 89  --  BUN 6 7 8 9 11   --   CREATININE 0.51 0.50 0.51 0.53 0.63  --   CALCIUM 9.1 8.7* 8.6* 8.3* 8.5*  --    GFR: Estimated Creatinine Clearance: 84.3 mL/min (by C-G formula based on SCr of 0.63 mg/dL). Liver Function Tests: Recent Labs  Lab 11/04/17 1934  AST 16  ALT 15  ALKPHOS 73  BILITOT 0.1*  PROT 6.7  ALBUMIN 3.4*   Recent Labs  Lab 11/04/17 1933  LIPASE 26   No results for input(s): AMMONIA in the last 168 hours. Coagulation Profile: Recent Labs  Lab 11/07/17 0550  INR 1.02   Cardiac Enzymes: Recent Labs  Lab 11/07/17 0850  TROPONINI 0.03*   BNP (last 3 results) No results for input(s): PROBNP in the last 8760 hours. HbA1C: Recent Labs    11/07/17 0850  HGBA1C 5.2   CBG: Recent Labs  Lab 11/07/17 1211 11/07/17 1618 11/07/17 2120 11/08/17 0813 11/08/17 1205  GLUCAP 126* 102* 95 117* 116*   Lipid Profile: No results for input(s): CHOL, HDL, LDLCALC, TRIG, CHOLHDL, LDLDIRECT in  the last 72 hours. Thyroid Function Tests: No results for input(s): TSH, T4TOTAL, FREET4, T3FREE, THYROIDAB in the last 72 hours. Anemia Panel: No results for input(s): VITAMINB12, FOLATE, FERRITIN, TIBC, IRON, RETICCTPCT in the last 72 hours. Sepsis Labs: No results for input(s): PROCALCITON, LATICACIDVEN in the last 168 hours.  Recent Results (from the past 240 hour(s))  Surgical PCR screen     Status: Abnormal   Collection Time: 11/08/17  4:54 AM  Result Value Ref Range Status   MRSA, PCR NEGATIVE NEGATIVE Final   Staphylococcus aureus POSITIVE (A) NEGATIVE Final    Comment: (NOTE) The Xpert SA Assay (FDA approved for NASAL specimens in patients 36 years of age and older), is one component of a comprehensive surveillance program. It is not intended to diagnose infection nor to guide or monitor treatment. Performed at [facility], [place] [address], [place], [place] [address]       Radiology Studies: Dg Chest 1 View  Result Date: 11/07/2017 CLINICAL DATA:  Status post fall, with concern for chest injury. Initial encounter. EXAM: CHEST  1 VIEW COMPARISON:  Chest radiograph performed 11/04/2017 FINDINGS: The lungs are well-aerated. Mild vascular congestion is noted. Peribronchial thickening is seen. Chronically increased interstitial markings are noted. There is no evidence of pleural effusion or pneumothorax. The cardiomediastinal silhouette is within normal limits. No acute osseous abnormalities are seen. IMPRESSION: Mild vascular congestion noted. Peribronchial thickening seen. Chronically increased interstitial markings noted. No displaced rib fractures identified. Electronically Signed   By: Garald Balding M.D.   On: 11/07/2017 06:47   Dg Chest Port 1 View  Result Date: 11/08/2017 CLINICAL DATA:  Acute onset of respiratory distress. EXAM: PORTABLE CHEST 1 VIEW COMPARISON:  Chest radiograph performed 11/07/2017 FINDINGS: Lung expansion is mildly decreased. Patchy left-sided  airspace opacity may reflect atelectasis or pneumonia. The right lung remains relatively clear. Mild vascular congestion is noted. No pleural effusion or pneumothorax is seen. The cardiomediastinal silhouette is borderline normal in size. No acute osseous abnormalities are identified. IMPRESSION: Patchy left-sided airspace opacity may reflect atelectasis or pneumonia. Mild vascular congestion noted. Electronically Signed   By: Garald Balding M.D.   On: 11/08/2017 06:34   Dg Hip Unilat  With Pelvis 2-3 Views Right  Result Date: 11/07/2017 CLINICAL DATA:  Status post fall, with right hip pain, acute onset. Initial encounter. EXAM: DG HIP (WITH OR WITHOUT PELVIS) 2-3V RIGHT COMPARISON:  Right hip radiographs performed 07/31/2017 FINDINGS: There is a comminuted right femoral intertrochanteric fracture, with mild medial rotation of the distal femur. The right femoral head remains seated at the acetabulum. The left hip joint is unremarkable. There appears to be healing chronic fractures of the right superior and inferior pubic rami. Postoperative changes are noted at the sacrum bilaterally. The visualized bowel gas pattern is grossly unremarkable. IMPRESSION: 1. Comminuted right femoral intertrochanteric fracture, with mild medial rotation of the distal femur. 2. Healing chronic fractures of the right superior and inferior pubic rami. Electronically Signed   By: Garald Balding M.D.   On: 11/07/2017 06:46    Scheduled Meds: . ALPRAZolam  0.5 mg Oral TID  . [START ON 11/09/2017] aspirin  81 mg Oral Daily  . buPROPion  100 mg Oral TID  . carvedilol  25 mg Oral BID WC  . Chlorhexidine Gluconate Cloth  6 each Topical Daily  . cloNIDine  0.2 mg Oral TID  . dicyclomine  20 mg Oral BID  . enoxaparin (LOVENOX) injection  40 mg Subcutaneous Q24H  . etomidate  0.3 mg/kg Intravenous Once  . fentaNYL (SUBLIMAZE) injection  100 mcg Intravenous Once  . [START ON 11/09/2017] ferrous sulfate  300 mg Per Tube Q breakfast    . fluticasone  2 spray Each Nare BID  . fluticasone furoate-vilanterol  1 puff Inhalation Daily  . furosemide      . hydrALAZINE  100 mg Oral Q8H  . insulin aspart  0-9 Units Subcutaneous TID WC  . lurasidone  80 mg Oral Q breakfast  . Melatonin  9 mg Oral QHS  . midazolam  2 mg Intravenous Once  . mupirocin ointment  1 application Nasal BID  . pantoprazole sodium  40 mg Per Tube Daily  . PARoxetine  20 mg Oral Daily  . pravastatin  20 mg Oral Daily  . rocuronium  1 mg/kg Intravenous Once  . traZODone  50 mg Oral QHS  . valproic acid  250 mg Oral BID   Continuous Infusions:  Time spent: >35 minutes  Deatra James, MD [facility],  Pager [phone]  If 7PM-7AM, please contact night-coverage [email or web address]   Password [email or web address]  11/08/2017, 2:18 PM

## 2017-11-08 NOTE — Progress Notes (Signed)
Noted pt sat drop to 70%, came to room STAT and placed on bipap.  RN at bedside and aware.  MD came to bedside to eval pt.

## 2017-11-09 ENCOUNTER — Inpatient Hospital Stay (HOSPITAL_COMMUNITY): Payer: Medicare Other

## 2017-11-09 DIAGNOSIS — E871 Hypo-osmolality and hyponatremia: Secondary | ICD-10-CM

## 2017-11-09 DIAGNOSIS — R0603 Acute respiratory distress: Secondary | ICD-10-CM

## 2017-11-09 DIAGNOSIS — M79609 Pain in unspecified limb: Secondary | ICD-10-CM

## 2017-11-09 DIAGNOSIS — M7989 Other specified soft tissue disorders: Secondary | ICD-10-CM

## 2017-11-09 DIAGNOSIS — F419 Anxiety disorder, unspecified: Secondary | ICD-10-CM

## 2017-11-09 DIAGNOSIS — F329 Major depressive disorder, single episode, unspecified: Secondary | ICD-10-CM

## 2017-11-09 DIAGNOSIS — S72001A Fracture of unspecified part of neck of right femur, initial encounter for closed fracture: Secondary | ICD-10-CM

## 2017-11-09 LAB — PROCALCITONIN
PROCALCITONIN: 0.16 ng/mL
Procalcitonin: <0.1 ng/mL

## 2017-11-09 LAB — BASIC METABOLIC PANEL
Anion gap: 11 (ref 5–15)
Anion gap: 12 (ref 5–15)
Anion gap: 9 (ref 5–15)
Anion gap: 9 (ref 5–15)
BUN: 12 mg/dL (ref 6–20)
BUN: 13 mg/dL (ref 6–20)
BUN: 13 mg/dL (ref 6–20)
BUN: 18 mg/dL (ref 6–20)
CALCIUM: 8.2 mg/dL — AB (ref 8.9–10.3)
CALCIUM: 8.1 mg/dL — AB (ref 8.9–10.3)
CHLORIDE: 92 mmol/L — AB (ref 101–111)
CHLORIDE: 91 mmol/L — AB (ref 101–111)
CHLORIDE: 91 mmol/L — AB (ref 101–111)
CHLORIDE: 94 mmol/L — AB (ref 101–111)
CO2: 27 mmol/L (ref 22–32)
CO2: 26 mmol/L (ref 22–32)
CO2: 28 mmol/L (ref 22–32)
CO2: 28 mmol/L (ref 22–32)
CREATININE: 0.69 mg/dL (ref 0.44–1.00)
CREATININE: 0.75 mg/dL (ref 0.44–1.00)
CREATININE: 0.76 mg/dL (ref 0.44–1.00)
CREATININE: 0.82 mg/dL (ref 0.44–1.00)
Calcium: 8.2 mg/dL — ABNORMAL LOW (ref 8.9–10.3)
Calcium: 8.3 mg/dL — ABNORMAL LOW (ref 8.9–10.3)
GFR calc Af Amer: >60 mL/min (ref >60)
GFR calc Af Amer: >60 mL/min (ref >60)
GFR calc Af Amer: >60 mL/min (ref >60)
GFR calc non Af Amer: >60 mL/min (ref >60)
GFR calc non Af Amer: >60 mL/min (ref >60)
GFR calc non Af Amer: >60 mL/min (ref >60)
GFR calc non Af Amer: >60 mL/min (ref >60)
GLUCOSE: 157 mg/dL — AB (ref 65–99)
Glucose, Bld: 112 mg/dL — ABNORMAL HIGH (ref 65–99)
Glucose, Bld: 85 mg/dL (ref 65–99)
Glucose, Bld: 142 mg/dL — ABNORMAL HIGH (ref 65–99)
Potassium: 3.4 mmol/L — ABNORMAL LOW (ref 3.5–5.1)
Potassium: 3.5 mmol/L (ref 3.5–5.1)
Potassium: 4.1 mmol/L (ref 3.5–5.1)
Potassium: 4.8 mmol/L (ref 3.5–5.1)
SODIUM: 130 mmol/L — AB (ref 135–145)
SODIUM: 129 mmol/L — AB (ref 135–145)
Sodium: 128 mmol/L — ABNORMAL LOW (ref 135–145)
Sodium: 131 mmol/L — ABNORMAL LOW (ref 135–145)

## 2017-11-09 LAB — URINE CULTURE

## 2017-11-09 LAB — BLOOD GAS, ARTERIAL
ACID-BASE EXCESS: 4.6 mmol/L — AB (ref 0.0–2.0)
Bicarbonate: 28.7 mmol/L — ABNORMAL HIGH (ref 20.0–28.0)
Drawn by: 404151
FIO2: 100
MECHVT: 440 mL
O2 Saturation: 98.7 %
PEEP: 10 cmH2O
PO2 ART: 140 mmHg — AB (ref 83.0–108.0)
Patient temperature: 98.6
RATE: 18 resp/min
pCO2 arterial: 43.6 mmHg (ref 32.0–48.0)
pH, Arterial: 7.434 (ref 7.350–7.450)

## 2017-11-09 LAB — CBC
HEMATOCRIT: 28.7 % — AB (ref 36.0–46.0)
Hemoglobin: 9.8 g/dL — ABNORMAL LOW (ref 12.0–15.0)
MCH: 29.3 pg (ref 26.0–34.0)
MCHC: 34.1 g/dL (ref 30.0–36.0)
MCV: 85.7 fL (ref 78.0–100.0)
Platelets: 121 10*3/uL — ABNORMAL LOW (ref 150–400)
RBC: 3.35 MIL/uL — ABNORMAL LOW (ref 3.87–5.11)
RDW: 13.9 % (ref 11.5–15.5)
WBC: 11.2 10*3/uL — ABNORMAL HIGH (ref 4.0–10.5)

## 2017-11-09 LAB — STREP PNEUMONIAE URINARY ANTIGEN: Strep Pneumo Urinary Antigen: NEGATIVE

## 2017-11-09 LAB — GLUCOSE, CAPILLARY
GLUCOSE-CAPILLARY: 113 mg/dL — AB (ref 65–99)
GLUCOSE-CAPILLARY: 114 mg/dL — AB (ref 65–99)
Glucose-Capillary: 104 mg/dL — ABNORMAL HIGH (ref 65–99)
Glucose-Capillary: 84 mg/dL (ref 65–99)
Glucose-Capillary: 112 mg/dL — ABNORMAL HIGH (ref 65–99)
Glucose-Capillary: 156 mg/dL — ABNORMAL HIGH (ref 65–99)

## 2017-11-09 LAB — MAGNESIUM: Magnesium: 1.4 mg/dL — ABNORMAL LOW (ref 1.7–2.4)

## 2017-11-09 LAB — TRIGLYCERIDES: TRIGLYCERIDES: 131 mg/dL (ref <150)

## 2017-11-09 LAB — PHOSPHORUS: Phosphorus: 3.8 mg/dL (ref 2.5–4.6)

## 2017-11-09 MED ORDER — POTASSIUM CHLORIDE 10 MEQ/100ML IV SOLN
10.0000 meq | INTRAVENOUS | Status: AC
  Administered 2017-11-09 (×4): 10 meq via INTRAVENOUS
  Filled 2017-11-09 (×4): qty 100

## 2017-11-09 MED ORDER — SODIUM CHLORIDE 0.9 % IV SOLN
2.0000 g | Freq: Once | INTRAVENOUS | Status: AC
  Administered 2017-11-09: 2 g via INTRAVENOUS
  Filled 2017-11-09: qty 2

## 2017-11-09 MED ORDER — FUROSEMIDE 10 MG/ML IJ SOLN
40.0000 mg | Freq: Four times a day (QID) | INTRAMUSCULAR | Status: AC
  Administered 2017-11-09 (×3): 40 mg via INTRAVENOUS
  Filled 2017-11-09 (×2): qty 4

## 2017-11-09 MED ORDER — PANTOPRAZOLE SODIUM 40 MG PO PACK
40.0000 mg | PACK | Freq: Every day | ORAL | Status: DC
  Administered 2017-11-09 – 2017-11-13 (×5): 40 mg
  Filled 2017-11-09 (×6): qty 20

## 2017-11-09 MED ORDER — MIDAZOLAM HCL 2 MG/2ML IJ SOLN: 2.0000 mg | INTRAMUSCULAR | Status: DC | PRN

## 2017-11-09 MED ORDER — FENTANYL CITRATE (PF) 100 MCG/2ML IJ SOLN
50.0000 ug | Freq: Once | INTRAMUSCULAR | Status: AC
  Administered 2017-11-09: 50 ug via INTRAVENOUS

## 2017-11-09 MED ORDER — ACETAMINOPHEN 650 MG RE SUPP: 650.0000 mg | Freq: Four times a day (QID) | RECTAL | Status: DC | PRN

## 2017-11-09 MED ORDER — PROPOFOL 1000 MG/100ML IV EMUL
0.0000 ug/kg/min | INTRAVENOUS | Status: DC
  Administered 2017-11-09: 10 ug/kg/min via INTRAVENOUS

## 2017-11-09 MED ORDER — LIDOCAINE 5 % EX PTCH
1.0000 | MEDICATED_PATCH | CUTANEOUS | Status: DC
  Administered 2017-11-09 – 2017-11-15 (×6): 1 via TRANSDERMAL
  Filled 2017-11-09 (×8): qty 1

## 2017-11-09 MED ORDER — SUCCINYLCHOLINE CHLORIDE 20 MG/ML IJ SOLN
100.0000 mg | Freq: Once | INTRAMUSCULAR | Status: AC
  Administered 2017-11-09: 100 mg via INTRAVENOUS

## 2017-11-09 MED ORDER — SENNOSIDES 8.8 MG/5ML PO SYRP
5.0000 mL | ORAL_SOLUTION | Freq: Every day | ORAL | Status: DC
  Administered 2017-11-09 – 2017-11-13 (×5): 5 mL
  Filled 2017-11-09 (×6): qty 5

## 2017-11-09 MED ORDER — POTASSIUM CHLORIDE 20 MEQ/15ML (10%) PO SOLN
40.0000 meq | Freq: Three times a day (TID) | ORAL | Status: AC
  Administered 2017-11-09 (×2): 40 meq
  Filled 2017-11-09 (×2): qty 30

## 2017-11-09 MED ORDER — DOCUSATE SODIUM 50 MG/5ML PO LIQD
100.0000 mg | Freq: Two times a day (BID) | ORAL | Status: DC
  Administered 2017-11-09 – 2017-11-14 (×11): 100 mg
  Filled 2017-11-09 (×12): qty 10

## 2017-11-09 MED ORDER — PRO-STAT SUGAR FREE PO LIQD
30.0000 mL | Freq: Three times a day (TID) | ORAL | Status: DC
  Administered 2017-11-09 – 2017-11-14 (×13): 30 mL
  Filled 2017-11-09 (×12): qty 30

## 2017-11-09 MED ORDER — FENTANYL CITRATE (PF) 100 MCG/2ML IJ SOLN
INTRAMUSCULAR | Status: AC
  Administered 2017-11-09: 50 ug via INTRAVENOUS
  Filled 2017-11-09: qty 2

## 2017-11-09 MED ORDER — ACETAMINOPHEN 160 MG/5ML PO SOLN: 650.0000 mg | Freq: Four times a day (QID) | ORAL | Status: DC | PRN

## 2017-11-09 MED ORDER — PROPOFOL 1000 MG/100ML IV EMUL
INTRAVENOUS | Status: AC
  Filled 2017-11-09: qty 100

## 2017-11-09 MED ORDER — SODIUM CHLORIDE 0.9 % IV SOLN: 250.0000 mL | INTRAVENOUS | Status: DC | PRN

## 2017-11-09 MED ORDER — FENTANYL BOLUS VIA INFUSION
50.0000 ug | INTRAVENOUS | Status: DC | PRN
  Administered 2017-11-09 – 2017-11-10 (×2): 50 ug via INTRAVENOUS
  Administered 2017-11-10: 100 ug via INTRAVENOUS
  Administered 2017-11-11 (×2): 50 ug via INTRAVENOUS
  Filled 2017-11-09: qty 50

## 2017-11-09 MED ORDER — VANCOMYCIN HCL IN DEXTROSE 1-5 GM/200ML-% IV SOLN
1000.0000 mg | Freq: Two times a day (BID) | INTRAVENOUS | Status: DC
  Administered 2017-11-09 – 2017-11-15 (×12): 1000 mg via INTRAVENOUS
  Filled 2017-11-09 (×13): qty 200

## 2017-11-09 MED ORDER — CHLORHEXIDINE GLUCONATE 0.12% ORAL RINSE (MEDLINE KIT)
15.0000 mL | Freq: Two times a day (BID) | OROMUCOSAL | Status: DC
  Administered 2017-11-09 – 2017-11-14 (×10): 15 mL via OROMUCOSAL

## 2017-11-09 MED ORDER — MAGNESIUM SULFATE 4 GM/100ML IV SOLN
4.0000 g | Freq: Once | INTRAVENOUS | Status: AC
  Administered 2017-11-09: 4 g via INTRAVENOUS
  Filled 2017-11-09: qty 100

## 2017-11-09 MED ORDER — ASPIRIN 81 MG PO CHEW
81.0000 mg | CHEWABLE_TABLET | Freq: Every day | ORAL | Status: DC
  Administered 2017-11-09 – 2017-11-20 (×12): 81 mg via ORAL
  Filled 2017-11-09 (×12): qty 1

## 2017-11-09 MED ORDER — VANCOMYCIN HCL IN DEXTROSE 1-5 GM/200ML-% IV SOLN
1000.0000 mg | Freq: Two times a day (BID) | INTRAVENOUS | Status: DC
  Filled 2017-11-09: qty 200

## 2017-11-09 MED ORDER — FENTANYL 2500MCG IN NS 250ML (10MCG/ML) PREMIX INFUSION
25.0000 ug/h | INTRAVENOUS | Status: DC
  Administered 2017-11-09: 400 ug/h via INTRAVENOUS
  Administered 2017-11-09 (×2): 200 ug/h via INTRAVENOUS
  Administered 2017-11-09: 50 ug/h via INTRAVENOUS
  Administered 2017-11-09 – 2017-11-12 (×9): 400 ug/h via INTRAVENOUS
  Administered 2017-11-12: 175 ug/h via INTRAVENOUS
  Filled 2017-11-09 (×14): qty 250

## 2017-11-09 MED ORDER — SODIUM CHLORIDE 0.9 % IV SOLN
2.0000 g | Freq: Three times a day (TID) | INTRAVENOUS | Status: AC
  Administered 2017-11-09 – 2017-11-16 (×21): 2 g via INTRAVENOUS
  Filled 2017-11-09 (×21): qty 2

## 2017-11-09 MED ORDER — ASPIRIN EC 81 MG PO TBEC
81.0000 mg | DELAYED_RELEASE_TABLET | Freq: Every day | ORAL | Status: DC
  Filled 2017-11-09 (×2): qty 1

## 2017-11-09 MED ORDER — VITAL HIGH PROTEIN PO LIQD
1000.0000 mL | ORAL | Status: DC
  Administered 2017-11-09 – 2017-11-12 (×4): 1000 mL
  Administered 2017-11-13: 07:00:00
  Administered 2017-11-13: 1000 mL

## 2017-11-09 MED ORDER — FAMOTIDINE IN NACL 20-0.9 MG/50ML-% IV SOLN
20.0000 mg | Freq: Two times a day (BID) | INTRAVENOUS | Status: DC
  Administered 2017-11-09 – 2017-11-10 (×4): 20 mg via INTRAVENOUS
  Filled 2017-11-09 (×4): qty 50

## 2017-11-09 MED ORDER — VANCOMYCIN HCL 10 G IV SOLR
1750.0000 mg | Freq: Once | INTRAVENOUS | Status: AC
  Administered 2017-11-09: 1750 mg via INTRAVENOUS
  Filled 2017-11-09: qty 1750

## 2017-11-09 MED ORDER — MIDAZOLAM HCL 2 MG/2ML IJ SOLN
2.0000 mg | INTRAMUSCULAR | Status: DC | PRN
  Administered 2017-11-09: 2 mg via INTRAVENOUS

## 2017-11-09 MED ORDER — ALPRAZOLAM 0.5 MG PO TABS
0.5000 mg | ORAL_TABLET | Freq: Three times a day (TID) | ORAL | Status: DC
  Administered 2017-11-09 – 2017-11-14 (×16): 0.5 mg
  Filled 2017-11-09 (×17): qty 1

## 2017-11-09 MED ORDER — ETOMIDATE 2 MG/ML IV SOLN
25.0000 mg | Freq: Once | INTRAVENOUS | Status: AC
  Administered 2017-11-09: 25 mg via INTRAVENOUS

## 2017-11-09 MED ORDER — ORAL CARE MOUTH RINSE
15.0000 mL | OROMUCOSAL | Status: DC
  Administered 2017-11-09 – 2017-11-14 (×50): 15 mL via OROMUCOSAL

## 2017-11-09 NOTE — Progress Notes (Signed)
eLink Physician-Brief Progress Note Patient Name: VIVIANN BROYLES DOB: [birth year] MRN: [id number]   Date of Service  11/09/2017  HPI/Events of Note  Hypokalemia and hypomag  eICU Interventions  Potassium and Mag replaced     Intervention Category Intermediate Interventions: Electrolyte abnormality - evaluation and management  DETERDING,ELIZABETH 11/09/2017, 4:32 AM

## 2017-11-09 NOTE — Progress Notes (Signed)
*  PRELIMINARY RESULTS* Vascular Ultrasound Bilateral lower extremity venosu duplex has been completed.  Preliminary findings: No evidence of deep vein thrombosis in the visualized veins of the lower extremitites or baker's cysts bilaterally.  Somewhat limited exam due to patient body habitus and penetration.   Natalie Thomas 11/09/2017, 12:44 PM

## 2017-11-09 NOTE — Progress Notes (Signed)
77yoF with hx CHF, COPD, Chronic hypoxia (on 2L O2), Anemia, Anxiety, DM, HTN, GERD, Chronic Hyponatremia, Osteoporosis, Schizoaffective disorder, admitted with Hip fracture due to a GLF and Abdominal pain due to Ileus. She since then has developed acute hypoxic respiratory failure that continues to progress. I was called emergently to the bedside to evaluate patient given her worsening hypoxia and work of breathing. At time of my exam patient was already placed on BIPAP due to work of breathing and hypoxia. Pox 85-89% on 100% FIO2 BIPAP with IPAP 14, EPAP 8. Lungs CTA b/l but decreased breath sounds on left. No wheezing, rhonchi, or crackles. Trace BLE edema. Patient agitated and also c/o hip pain. ABG showed no hypercapnea but profound hypoxia (PaO2 58 on those settings). BNP only 48. Concern that she may have a PE given her hip fracture this admission. Obtained CTA Chest that on my review showed no PE but did show a LLL pneumonia (official Radiology read is still pending). On arrival back to the ICU after the CTA patient again c/o pain and was agitated but Pox only 86% on BIPAP. Decision made to intubate her given concern for possible aspiration pna and the need to adequately treat her pain without affecting her already questionable airway. See procedure note for intubation details.   1. Acute-on-Chronic Hypoxic Respiratory Failure; Aspiration pna - ABG prior to intubation with significant hypoxia (up from baseline of 2L home O2); repeat ABG 1hr post intubation - CTA Chest on my review showed no PE but did show a LLL pna. Pulmonary arteries also enlarged likely consistent with Pulmonary HTN. - CXR post intubation on my review shows ETT in good position - Obtain sputum culture (copious creamy secretions suctioned from ETT post intubation) and procalcitonin; lactate only 0.8 - Start Vanc and Aztreonam  2. COPD - no wheezing on my exam or signs of exacerbation - change breo to pulmicort and brovana nebs  BID; continue duonebs scheduled  3. Diastolic CHF - does not appear clinically overloaded at time of my exam, and BNP only 48. Would hold off on further diuresis for now.   4. Recent Ileus - recent admission 4/24-4/27 with enteritis; seen in ER 5/13 with abd pain due to constipation - Abdomen soft and nontender at time of my exam  5. 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  - baseline Na 131. At time of last discharge Na was 128. On admit Na 123, now up to 126.  - continue checking BMP q6hrs  6. Hip fracture - hip fracture due to GLF this admission - fentanyl PRN; lidocaine patch to hip.   7. DM: - NPO; SSI q4hrs   8. HTN: - soft BP post intubation; d/c all scheduled BP meds.   9. Schizoaffective disorder; Bipolar disorder; Anxiety: - hold scheduled SSRI given that we are starting fentanyl for sedation and the combination can increase risk of serotonin syndrome - hold wellbutrin - continue scheduled xanax per tube to prevent withdrawal - haldol PRN   60 minutes nonprocedural critical care time  Vernie Murders, MD Pulmonary & Critical Care Medicine Pager: [phone]

## 2017-11-09 NOTE — Progress Notes (Signed)
Pharmacy Antibiotic Note  Natalie Thomas is a 57 y.o. female admitted on 11/07/2017 with pneumonia.  Pharmacy has been consulted for vancomycin and aztreonam dosing.(Spoke with NP and will use [facility] order set for HCAP with PCN allergy)  Plan: Aztreonam 2gm IV q8 hours Vancomycin 1750 mg IV x 1 then 1gm IV q12 hours F/u renal function, cultures and clinical course  Height: 5\' 4"  (162.6 cm) Weight: 193 lb 12.6 oz (87.9 kg) IBW/kg (Calculated) : 54.7  Temp (24hrs), Avg:97.9 F (36.6 C), Min:97.7 F (36.5 C), Max:98.1 F (36.7 C)  Recent Labs  Lab 11/04/17 1934  11/07/17 0550 11/07/17 0850 11/07/17 1414 11/07/17 2215 11/08/17 0337 11/08/17 1537 11/08/17 2257  WBC 6.3  --  11.5*  --   --   --  7.2  --   --   CREATININE 0.51   < > 0.51 0.50 0.51 0.53 0.63 0.62  --   LATICACIDVEN  --   --   --   --   --   --   --   --  0.8   < > = values in this interval not displayed.    Estimated Creatinine Clearance: 84.3 mL/min (by C-G formula based on SCr of 0.62 mg/dL).    Allergies  Allergen Reactions  . Clarithromycin Itching  . Penicillins Itching    Has patient had a PCN reaction causing immediate rash, facial/tongue/throat swelling, SOB or lightheadedness with hypotension: No Has patient had a PCN reaction causing severe rash involving mucus membranes or skin necrosis: No Has patient had a PCN reaction that required hospitalization: Unknown Has patient had a PCN reaction occurring within the last 10 years: No If all of the above answers are "NO", then may proceed with Cephalosporin use.     Thank you for allowing pharmacy to be a part of this patient's care.  Excell Seltzer Poteet 11/09/2017 12:32 AM

## 2017-11-09 NOTE — Progress Notes (Signed)
PULMONARY / CRITICAL CARE MEDICINE   Name: Natalie Thomas MRN: [id number] DOB: March 19, 1961    ADMISSION DATE:  11/07/2017 CONSULTATION DATE:  11/08/2017  REFERRING MD:  [facility] - Shahmehdi  CHIEF COMPLAINT:  Acute hypoxemic respiratory failure  HISTORY OF PRESENT ILLNESS:   Patient is encephalopathic, patient is a 57 year old schizophrenic female with PMH of CHF and COPD on 2L [place] who seems to have developed respiratory failure with COPD and CHF exacerbations while on the floor after being admitted for a abdominal pain due to narcotic use causing ileus.  Patient is confused, on BiPAP with stable vitals and ABG pending.  WBC is normal.  I reviewed CXR myself, pulmonary edema noted.   SUBJECTIVE:  Intubated overnight  VITAL SIGNS: BP (!) 149/69   Pulse 66   Temp 98.8 F (37.1 C) (Oral)   Resp 17   Ht 5\' 4"  (1.626 m)   Wt 186 lb 8.2 oz (84.6 kg)   SpO2 100%   BMI 32.01 kg/m   HEMODYNAMICS:    VENTILATOR SETTINGS: Vent Mode: PRVC FiO2 (%):  [80 %-100 %] 80 % Set Rate:  [18 bmp] 18 bmp Vt Set:  [440 mL] 440 mL PEEP:  [10 cmH20] 10 cmH20 Plateau Pressure:  [20 cmH20-29 cmH20] 21 cmH20  INTAKE / OUTPUT: I/O last 3 completed shifts: In: 1567.8 [P.O.:250; I.V.:367.8; IV Piggyback:950] Out: 2902 [IRSWN:[id number]]  PHYSICAL EXAMINATION: General:  Acute on chronically ill appearing female, NAD Neuro:  Awake but confused, moving all ext to command. HEENT:  [place]/AT, PERRL, EOM-I and MMM Cardiovascular:  RRR, Nl S1/S2 and -M/R/G Lungs:  Diffuse crackles. Abdomen:  Soft, NT, ND and +BS Musculoskeletal:  -edema and -tenderness Skin:  Intact  LABS:  BMET Recent Labs  Lab 11/08/17 1537 11/09/17 0335 11/09/17 0629  NA 126* 130* 129*  K 3.4* 3.4* 3.5  CL 88* 92* 91*  CO2 28 27 26   BUN 10 12 13   CREATININE 0.62 0.69 0.75  GLUCOSE 105* 112* 85    Electrolytes Recent Labs  Lab 11/08/17 1537 11/09/17 0335 11/09/17 0629  CALCIUM 8.3* 8.2* 8.2*  MG  --  1.4*  --   PHOS  --   3.8  --     CBC Recent Labs  Lab 11/07/17 0550 11/08/17 0337 11/09/17 0335  WBC 11.5* 7.2 11.2*  HGB 12.8 9.8* 9.8*  HCT 38.0 29.4* 28.7*  PLT 228 138* 121*    Coag's Recent Labs  Lab 11/07/17 0550  INR 1.02    Sepsis Markers Recent Labs  Lab 11/08/17 2257 11/09/17 0335  LATICACIDVEN 0.8  --   PROCALCITON <0.10 0.16    ABG Recent Labs  Lab 11/08/17 1536 11/08/17 2133 11/09/17 0135  PHART 7.410 7.414 7.434  PCO2ART 43.0 44.8 43.6  PO2ART 71.0* 58.0* 140*    Liver Enzymes Recent Labs  Lab 11/04/17 1934  AST 16  ALT 15  ALKPHOS 73  BILITOT 0.1*  ALBUMIN 3.4*    Cardiac Enzymes Recent Labs  Lab 11/07/17 0850  TROPONINI 0.03*    Glucose Recent Labs  Lab 11/08/17 1205 11/08/17 1658 11/08/17 2139 11/09/17 0018 11/09/17 0330 11/09/17 0828  GLUCAP 116* 104* 120* 114* 104* 84    Imaging Ct Angio Chest Pe W Or Wo Contrast  Result Date: 11/09/2017 CLINICAL DATA:  Shortness of breath and hypoxia. Preoperative for femur fracture repair. EXAM: CT ANGIOGRAPHY CHEST WITH CONTRAST TECHNIQUE: Multidetector CT imaging of the chest was performed using the standard protocol during bolus administration of  intravenous contrast. Multiplanar CT image reconstructions and MIPs were obtained to evaluate the vascular anatomy. CONTRAST:  185mL ISOVUE-370 IOPAMIDOL (ISOVUE-370) INJECTION 76% COMPARISON:  Chest radiograph performed earlier today at 9:58 p.m., and CTA of the chest performed 11/19/2011 FINDINGS: Cardiovascular:  There is no evidence of pulmonary embolus. The heart is borderline normal in size. Scattered coronary artery calcifications are seen. Scattered calcification is noted along the aortic arch and proximal great vessels, with moderate to severe luminal narrowing at the origin of the left subclavian artery secondary to calcification. Mediastinum/Nodes: A small pericardial effusion is noted. No mediastinal lymphadenopathy is seen. The thyroid gland is  unremarkable. No axillary lymphadenopathy is seen. Lungs/Pleura: There is near-complete opacification of the left mainstem bronchus and bronchioles, reflecting aspiration. There is near complete consolidation of the left lower lobe, reflecting aspiration pneumonia. Mild airspace opacity is noted at the right middle lobe. No significant pleural effusion or pneumothorax is seen. Blebs are noted at the left lung apex. Upper Abdomen: The visualized portions of the liver and spleen are unremarkable. Musculoskeletal: No acute osseous abnormalities are identified. There is mild chronic loss of height at the mid to lower thoracic spine. Endplate irregularity at the midthoracic spine is nonspecific. The visualized musculature is unremarkable in appearance. Review of the MIP images confirms the above findings. IMPRESSION: 1. No evidence of pulmonary embolus. 2. Near complete opacification of the left mainstem bronchus and bronchioles, and dense consolidation of the left lower lobe, reflecting aspiration pneumonia. 3. Mild airspace opacity at the right middle lobe. 4. Scattered coronary artery calcifications seen. 5. Small pericardial effusion noted. 6. Scattered calcification along the aortic arch and proximal great vessels, with moderate to severe luminal narrowing at the origin of the left subclavian artery secondary to calcification. 7. Nonspecific endplate irregularity of the midthoracic spine. Would correlate for any associated symptoms. MRI could be considered for further evaluation, if the patient has significant mid back pain. Electronically Signed   By: Garald Balding M.D.   On: 11/09/2017 01:20   Dg Chest Port 1 View  Result Date: 11/09/2017 CLINICAL DATA:  Endotracheal and orogastric tube placement. EXAM: PORTABLE CHEST 1 VIEW COMPARISON:  Chest radiograph and CTA of the chest performed 11/08/2017 FINDINGS: The patient's endotracheal tube is seen ending 2-3 cm above the carina. An enteric tube is noted  extending below the diaphragm. The lungs are well-aerated. Mild left basilar airspace opacity may reflect atelectasis or pneumonia, improved from the prior study. The previously noted left-sided pleural effusion is less apparent. There is no evidence of pneumothorax. The cardiomediastinal silhouette is within normal limits. No acute osseous abnormalities are seen. IMPRESSION: 1. Endotracheal tube seen ending 2-3 cm above the carina. 2. Enteric tube noted extending below the diaphragm. 3. Mild left basilar airspace opacity may reflect atelectasis or pneumonia, improved from the prior study. Electronically Signed   By: Garald Balding M.D.   On: 11/09/2017 01:22   Dg Chest Port 1 View  Result Date: 11/08/2017 CLINICAL DATA:  Acute onset of respiratory failure. Hypoxia. EXAM: PORTABLE CHEST 1 VIEW COMPARISON:  Chest radiograph performed earlier today at 6:24 a.m. FINDINGS: A small to moderate left-sided pleural effusion is noted, with left basilar airspace opacification. This may reflect asymmetric pulmonary edema or pneumonia, worsened from the prior study. Minimal right basilar atelectasis is noted. There is no evidence of pneumothorax. The cardiomediastinal silhouette is borderline normal in size. No acute osseous abnormalities are seen. IMPRESSION: Small to moderate left-sided pleural effusion, with left basilar airspace opacification.  This may reflect asymmetric pulmonary edema or pneumonia, worsened from the prior study. Minimal right basilar atelectasis noted. Electronically Signed   By: Garald Balding M.D.   On: 11/08/2017 22:15     STUDIES:  4/24 echo with normal EF and grade 1 diastolic dysfunction  CULTURES: Urine 5/16>>>  ANTIBIOTICS: None  SIGNIFICANT EVENTS: 5/17 transfer to the ICU for respiratory failure  LINES/TUBES: PIV  CXR consistent with pulmonary edema and cardiomegaly.  DISCUSSION: 57 year old female with history of psychogenic polydipsia that presents with acute on  chronic hypoxemic respiratory failure requiring BiPAP.  PCCM consulted for vent management.  ASSESSMENT / PLAN:  PULMONARY A: Acute on chronic hypoxemic respiratory failure.   ABG with alkalosis and severe hypoxemia P:   - Full vent support - High PEEP for hypoxemia - Lasix as below - Continue bronchodilators as ordered - No need for steroids, do not believe this is a COPD exacerbation - BD as ordered  CARDIOVASCULAR A:  Hypertension P:  - Hold anti-HTN - Lasix as below - Tele monitoring  RENAL A:   Hypokalemia P:   - Lasix 40 mg IV q6 x3 doses - KVO IVF - BMET in AM - Potassium replacement  GASTROINTESTINAL A:   No active issues P:   - NPO while on BiPAP - PPI - TF per nutrition  HEMATOLOGIC A:   No active issues P:  - CBC in AM - Transfuse per ICU protocol  INFECTIOUS A:   No active concerns P:   - Monitor WBC and fever curve  ENDOCRINE A:   DM   P:   - CBG - ISS  NEUROLOGIC A:   AMS, schizophrenic, unknown baseline P:   RASS goal: 00 - Fentanyl drip - D/C propofol  FAMILY  - Updates: No family bedside, patient is encephalopathic.  - Inter-disciplinary family meet or Palliative Care meeting due by:  day 7  The patient is critically ill with multiple organ systems failure and requires high complexity decision making for assessment and support, frequent evaluation and titration of therapies, application of advanced monitoring technologies and extensive interpretation of multiple databases.   Critical Care Time devoted to patient care services described in this note is  36  Minutes. This time reflects time of care of this signee Dr Jennet Maduro. This critical care time does not reflect procedure time, or teaching time or supervisory time of PA/NP/Med student/Med Resident etc but could involve care discussion time.  Rush Farmer, M.D. [facility] Pulmonary/Critical Care Medicine. Pager: [phone]. After hours pager: [phone].  11/09/2017,  9:59 AM

## 2017-11-09 NOTE — Procedures (Signed)
Endotracheal Intubation Procedure Note Indication for endotracheal intubation: impending respiratory failure Sedation: etomidate and midazolam Paralytic: succinylcholine Equipment: Macintosh 3 laryngoscope blade and 7.73mm cuffed endotracheal tube; secured 23cm at the lip Cricoid Pressure: no Number of attempts: 1 ETT location confirmed by by auscultation, by CXR and ETCO2 monitor.

## 2017-11-09 NOTE — Progress Notes (Signed)
Patient transferred to and from CT on BiPAP without incident.

## 2017-11-09 NOTE — Progress Notes (Signed)
Initial Nutrition Assessment  DOCUMENTATION CODES:   Obesity unspecified  INTERVENTION:    Vital High Protein at 30 ml/h (720 ml per day)  Pro-stat 30 ml TID  Provides 1020 kcal, 108 gm protein, 602 ml free water daily  NUTRITION DIAGNOSIS:   Inadequate oral intake related to inability to eat as evidenced by NPO status.  GOAL:   Provide needs based on ASPEN/SCCM guidelines  MONITOR:   Vent status, TF tolerance, Labs, I & O's  REASON FOR ASSESSMENT:   Ventilator, Consult Enteral/tube feeding initiation and management  ASSESSMENT:   57 yo female with PMH of depression, GERD, HTN, osteoporosis, DM-2, CHF, HLD,COPD, schizophrenia, anemia, and chronic hyponatremia who was admitted on 5/16 with abdominal pain, ileus. Developed hypoxemic respiratory failure and required intubation on 5/18.  Received MD Consult for TF initiation and management. Patient is currently intubated on ventilator support MV: 8.4 L/min Temp (24hrs), Avg:98 F (36.7 C), Min:97.6 F (36.4 C), Max:98.8 F (37.1 C)  Propofol: off Labs reviewed. Sodium 129 (L) CBG's: 84-113  Medications reviewed and include Colace and Novolog.    NUTRITION - FOCUSED PHYSICAL EXAM:    Most Recent Value  Orbital Region  No depletion  Upper Arm Region  No depletion  Thoracic and Lumbar Region  Unable to assess  Buccal Region  Unable to assess  Temple Region  Moderate depletion  Clavicle Bone Region  No depletion  Clavicle and Acromion Bone Region  No depletion  Scapular Bone Region  Unable to assess  Dorsal Hand  No depletion  Patellar Region  No depletion  Anterior Thigh Region  No depletion  Posterior Calf Region  No depletion  Edema (RD Assessment)  Mild  Hair  Reviewed  Eyes  Unable to assess  Mouth  Unable to assess  Skin  Reviewed  Nails  Reviewed       Diet Order:   Diet Order    None      EDUCATION NEEDS:   No education needs have been identified at this time  Skin:  Skin  Assessment: Reviewed RN Assessment(MASD groin)  Last BM:  PTA  Height:   Ht Readings from Last 1 Encounters:  11/08/17 5\' 4"  (1.626 m)    Weight:   Wt Readings from Last 1 Encounters:  11/09/17 186 lb 8.2 oz (84.6 kg)    Ideal Body Weight:  54.5 kg  BMI:  Body mass index is 32.01 kg/m.  Estimated Nutritional Needs:   Kcal:  1000-1200  Protein:  109 gm  Fluid:  1.5 L    Molli Barrows, RD, LDN, [place] Pager [phone] After Hours Pager [phone]

## 2017-11-10 ENCOUNTER — Inpatient Hospital Stay (HOSPITAL_COMMUNITY): Payer: Medicare Other

## 2017-11-10 ENCOUNTER — Encounter (HOSPITAL_COMMUNITY)
Admission: EM | Disposition: A | Discharge status: Skilled Nursing Facility | Payer: Self-pay | Source: Home / Self Care | Attending: Critical Care Medicine

## 2017-11-10 DIAGNOSIS — J9809 Other diseases of bronchus, not elsewhere classified: Secondary | ICD-10-CM

## 2017-11-10 DIAGNOSIS — J181 Lobar pneumonia, unspecified organism: Secondary | ICD-10-CM

## 2017-11-10 DIAGNOSIS — J9602 Acute respiratory failure with hypercapnia: Secondary | ICD-10-CM

## 2017-11-10 DIAGNOSIS — J189 Pneumonia, unspecified organism: Secondary | ICD-10-CM

## 2017-11-10 DIAGNOSIS — J9621 Acute and chronic respiratory failure with hypoxia: Secondary | ICD-10-CM

## 2017-11-10 DIAGNOSIS — J9601 Acute respiratory failure with hypoxia: Secondary | ICD-10-CM

## 2017-11-10 LAB — POCT I-STAT 3, ART BLOOD GAS (G3+)
ACID-BASE EXCESS: 3 mmol/L — AB (ref 0.0–2.0)
ACID-BASE EXCESS: 3 mmol/L — AB (ref 0.0–2.0)
Acid-Base Excess: 2 mmol/L (ref 0.0–2.0)
Bicarbonate: 30.2 mmol/L — ABNORMAL HIGH (ref 20.0–28.0)
Bicarbonate: 29.4 mmol/L — ABNORMAL HIGH (ref 20.0–28.0)
Bicarbonate: 29.6 mmol/L — ABNORMAL HIGH (ref 20.0–28.0)
O2 SAT: 95 %
O2 SAT: 68 %
O2 Saturation: 96 %
PCO2 ART: 61.4 mmHg — AB (ref 32.0–48.0)
PH ART: 7.303 — AB (ref 7.350–7.450)
PO2 ART: 86 mmHg (ref 83.0–108.0)
PO2 ART: 42 mmHg — AB (ref 83.0–108.0)
Patient temperature: 99.6
TCO2: 32 mmol/L (ref 22–32)
TCO2: 31 mmol/L (ref 22–32)
TCO2: 31 mmol/L (ref 22–32)
pCO2 arterial: 61 mmHg — ABNORMAL HIGH (ref 32.0–48.0)
pCO2 arterial: 56.2 mmHg — ABNORMAL HIGH (ref 32.0–48.0)
pH, Arterial: 7.292 — ABNORMAL LOW (ref 7.350–7.450)
pH, Arterial: 7.332 — ABNORMAL LOW (ref 7.350–7.450)
pO2, Arterial: 92 mmHg (ref 83.0–108.0)

## 2017-11-10 LAB — BASIC METABOLIC PANEL
ANION GAP: 9 (ref 5–15)
Anion gap: 8 (ref 5–15)
Anion gap: 10 (ref 5–15)
Anion gap: 8 (ref 5–15)
BUN: 23 mg/dL — AB (ref 6–20)
BUN: 27 mg/dL — ABNORMAL HIGH (ref 6–20)
BUN: 28 mg/dL — ABNORMAL HIGH (ref 6–20)
BUN: 30 mg/dL — ABNORMAL HIGH (ref 6–20)
CHLORIDE: 93 mmol/L — AB (ref 101–111)
CHLORIDE: 95 mmol/L — AB (ref 101–111)
CHLORIDE: 98 mmol/L — AB (ref 101–111)
CO2: 29 mmol/L (ref 22–32)
CO2: 27 mmol/L (ref 22–32)
CO2: 27 mmol/L (ref 22–32)
CO2: 28 mmol/L (ref 22–32)
CREATININE: 0.87 mg/dL (ref 0.44–1.00)
Calcium: 8.4 mg/dL — ABNORMAL LOW (ref 8.9–10.3)
Calcium: 8.4 mg/dL — ABNORMAL LOW (ref 8.9–10.3)
Calcium: 8.3 mg/dL — ABNORMAL LOW (ref 8.9–10.3)
Calcium: 8.5 mg/dL — ABNORMAL LOW (ref 8.9–10.3)
Chloride: 95 mmol/L — ABNORMAL LOW (ref 101–111)
Creatinine, Ser: 0.79 mg/dL (ref 0.44–1.00)
Creatinine, Ser: 0.86 mg/dL (ref 0.44–1.00)
Creatinine, Ser: 0.82 mg/dL (ref 0.44–1.00)
GFR calc Af Amer: >60 mL/min (ref >60)
GFR calc non Af Amer: >60 mL/min (ref >60)
GFR calc non Af Amer: >60 mL/min (ref >60)
GFR calc non Af Amer: >60 mL/min (ref >60)
Glucose, Bld: 153 mg/dL — ABNORMAL HIGH (ref 65–99)
Glucose, Bld: 139 mg/dL — ABNORMAL HIGH (ref 65–99)
Glucose, Bld: 138 mg/dL — ABNORMAL HIGH (ref 65–99)
Glucose, Bld: 146 mg/dL — ABNORMAL HIGH (ref 65–99)
POTASSIUM: 4.4 mmol/L (ref 3.5–5.1)
POTASSIUM: 4.5 mmol/L (ref 3.5–5.1)
POTASSIUM: 4.6 mmol/L (ref 3.5–5.1)
POTASSIUM: 5.2 mmol/L — AB (ref 3.5–5.1)
SODIUM: 131 mmol/L — AB (ref 135–145)
SODIUM: 134 mmol/L — AB (ref 135–145)
Sodium: 132 mmol/L — ABNORMAL LOW (ref 135–145)
Sodium: 130 mmol/L — ABNORMAL LOW (ref 135–145)

## 2017-11-10 LAB — GLUCOSE, CAPILLARY
GLUCOSE-CAPILLARY: 128 mg/dL — AB (ref 65–99)
GLUCOSE-CAPILLARY: 139 mg/dL — AB (ref 65–99)
GLUCOSE-CAPILLARY: 150 mg/dL — AB (ref 65–99)
Glucose-Capillary: 139 mg/dL — ABNORMAL HIGH (ref 65–99)
Glucose-Capillary: 143 mg/dL — ABNORMAL HIGH (ref 65–99)
Glucose-Capillary: 135 mg/dL — ABNORMAL HIGH (ref 65–99)

## 2017-11-10 LAB — BRAIN NATRIURETIC PEPTIDE: B Natriuretic Peptide: 34.8 pg/mL (ref 0.0–100.0)

## 2017-11-10 LAB — TRIGLYCERIDES: TRIGLYCERIDES: 175 mg/dL — AB (ref <150)

## 2017-11-10 LAB — PROCALCITONIN: PROCALCITONIN: 0.19 ng/mL

## 2017-11-10 LAB — CBC
HCT: 27.8 % — ABNORMAL LOW (ref 36.0–46.0)
HEMOGLOBIN: 9.2 g/dL — AB (ref 12.0–15.0)
MCH: 29.7 pg (ref 26.0–34.0)
MCHC: 33.1 g/dL (ref 30.0–36.0)
MCV: 89.7 fL (ref 78.0–100.0)
PLATELETS: 144 10*3/uL — AB (ref 150–400)
RBC: 3.1 MIL/uL — ABNORMAL LOW (ref 3.87–5.11)
RDW: 14.6 % (ref 11.5–15.5)
WBC: 8.2 10*3/uL (ref 4.0–10.5)

## 2017-11-10 LAB — PHOSPHORUS: Phosphorus: 4.1 mg/dL (ref 2.5–4.6)

## 2017-11-10 LAB — LACTIC ACID, PLASMA: LACTIC ACID, VENOUS: 0.9 mmol/L (ref 0.5–1.9)

## 2017-11-10 LAB — MAGNESIUM: Magnesium: 1.9 mg/dL (ref 1.7–2.4)

## 2017-11-10 SURGERY — FIXATION, FRACTURE, INTERTROCHANTERIC, WITH INTRAMEDULLARY ROD
Anesthesia: General | Laterality: Right

## 2017-11-10 MED ORDER — FENTANYL CITRATE (PF) 250 MCG/5ML IJ SOLN
INTRAMUSCULAR | Status: AC
  Filled 2017-11-10: qty 5

## 2017-11-10 MED ORDER — POTASSIUM CHLORIDE 20 MEQ/15ML (10%) PO SOLN
40.0000 meq | Freq: Three times a day (TID) | ORAL | Status: AC
  Administered 2017-11-10 (×2): 40 meq
  Filled 2017-11-10 (×2): qty 30

## 2017-11-10 MED ORDER — SODIUM CHLORIDE 0.9 % IJ SOLN
INTRAMUSCULAR | Status: AC
  Filled 2017-11-10: qty 20

## 2017-11-10 MED ORDER — VANCOMYCIN HCL 1000 MG IV SOLR
INTRAVENOUS | Status: AC
  Filled 2017-11-10: qty 1000

## 2017-11-10 MED ORDER — PROPOFOL 10 MG/ML IV BOLUS
INTRAVENOUS | Status: AC
  Filled 2017-11-10: qty 20

## 2017-11-10 MED ORDER — PHENYLEPHRINE 40 MCG/ML (10ML) SYRINGE FOR IV PUSH (FOR BLOOD PRESSURE SUPPORT)
PREFILLED_SYRINGE | INTRAVENOUS | Status: AC
  Filled 2017-11-10: qty 30

## 2017-11-10 MED ORDER — LIDOCAINE HCL (PF) 1 % IJ SOLN
INTRAMUSCULAR | Status: AC
  Administered 2017-11-10: 9 mL
  Filled 2017-11-10: qty 30

## 2017-11-10 MED ORDER — FUROSEMIDE 10 MG/ML IJ SOLN
40.0000 mg | Freq: Four times a day (QID) | INTRAMUSCULAR | Status: DC
  Administered 2017-11-10 (×2): 40 mg via INTRAVENOUS
  Filled 2017-11-10 (×2): qty 4

## 2017-11-10 MED ORDER — FUROSEMIDE 10 MG/ML IJ SOLN
80.0000 mg | Freq: Two times a day (BID) | INTRAMUSCULAR | Status: DC
  Administered 2017-11-10: 80 mg via INTRAVENOUS
  Filled 2017-11-10: qty 8

## 2017-11-10 MED ORDER — MIDAZOLAM HCL 2 MG/2ML IJ SOLN
2.0000 mg | Freq: Once | INTRAMUSCULAR | Status: AC
  Administered 2017-11-10: 2 mg via INTRAVENOUS

## 2017-11-10 MED ORDER — EPHEDRINE SULFATE 50 MG/ML IJ SOLN
INTRAMUSCULAR | Status: AC
  Filled 2017-11-10: qty 2

## 2017-11-10 MED ORDER — MIDAZOLAM HCL 2 MG/2ML IJ SOLN
INTRAMUSCULAR | Status: AC
  Filled 2017-11-10: qty 2

## 2017-11-10 MED ORDER — MIDAZOLAM HCL 2 MG/2ML IJ SOLN
INTRAMUSCULAR | Status: AC
  Administered 2017-11-10: 2 mg via INTRAVENOUS
  Filled 2017-11-10: qty 2

## 2017-11-10 MED ORDER — PROPOFOL 1000 MG/100ML IV EMUL
0.0000 ug/kg/min | INTRAVENOUS | Status: DC
  Administered 2017-11-10: 10 ug/kg/min via INTRAVENOUS
  Administered 2017-11-11: 30 ug/kg/min via INTRAVENOUS
  Administered 2017-11-11 – 2017-11-12 (×2): 10 ug/kg/min via INTRAVENOUS
  Administered 2017-11-12: 5 ug/kg/min via INTRAVENOUS
  Administered 2017-11-13 (×3): 30 ug/kg/min via INTRAVENOUS
  Administered 2017-11-14: 25 ug/kg/min via INTRAVENOUS
  Filled 2017-11-10 (×10): qty 100

## 2017-11-10 NOTE — Progress Notes (Signed)
Pt SpO2 82-86% on 80% FiO2 and 10 PEEP. RT increased FiO2 to 100% without improvement. MD notified, obtained stat CXR, and adjusted PEEP to 12. SpO2 now 94%. Will continue to monitor.

## 2017-11-10 NOTE — Progress Notes (Signed)
I was called to emergently come see this patient for worsening hypoxia. Blood gas with PaO2 42 with Pox reportedly in the 70's at the time (EMR documents Pox 87%). Patient was on 70% FIO2 and 14 PEEP at this time. In response to these desaturations, FIO2 increased to 100%. Aline placed and ABG repeated, showing PaO2 92 on 100% FIO2 and 14 PEEP. On my exam patient's lungs CTA b/l no wheezing/crackles/rhonchi. No LE edema. Patient well sedated. Careful review of Chest CT from 5/18 appears to show debris within the left mainstem bronchus and occlusion of the LLL bronchi. Decision made to perform bronchoscopy out of concern patient may have a large mucous plug. Bronch discovered plugs in left mainstem bronchus and in RUL, both of which were suctioned and removed. Thorough airway exam following removal of these plugs showed no further purulence, plugs, or any endobronchial lesions. Bronchial washing sent for culture. Will start scheduled Chest PT with her duonebs. Repeat ABG 1hr post bronch.   On further review of patient's chart, she continues to be diuresed despite BUN increasing and BNP only 48. I do NOT think she is volume overloaded at this time. Will stop the Lasix. Mild hyperkalemia (5.2) this AM likely related to developing AKI from overdiuresis. Will recheck BMP in AM.   60 minutes nonprocedural critical care time  Vernie Murders, MD Pulmonary & Critical Care Medicine Pager: [phone]

## 2017-11-10 NOTE — Procedures (Signed)
Arterial Catheter Insertion Procedure Note Natalie Thomas [id number] Nov 10, 1960  Procedure: Insertion of Arterial Catheter  Indications: Blood pressure monitoring and Frequent blood sampling  Procedure Details Consent: Unable to obtain consent because of altered level of consciousness. Time Out: Verified patient identification, verified procedure, site/side was marked, verified correct patient position, special equipment/implants available, medications/allergies/relevent history reviewed, required imaging and test results available.  Performed Assisted by Jenny Reichmann Day RRT,RCP Maximum sterile technique was used including antiseptics, cap, gloves, gown, hand hygiene, mask and sheet. Skin prep: Chlorhexidine; local anesthetic administered 20 gauge catheter was inserted into right radial artery using the Seldinger technique. ULTRASOUND GUIDANCE USED: NO Evaluation Blood flow good; BP tracing good. Complications: No apparent complications.   Natalie Thomas 11/10/2017

## 2017-11-10 NOTE — Procedures (Signed)
Bronchoscopy Procedure Note Natalie Thomas [id number] 08-Aug-1960  Procedure: Bronchoscopy Indications: Diagnostic evaluation of the airways, Obtain specimens for culture and/or other diagnostic studies and Remove secretions  Procedure Details Consent: Unable to obtain consent because of emergent medical necessity. Time Out: Verified patient identification, verified procedure, site/side was marked, verified correct patient position, special equipment/implants available, medications/allergies/relevent history reviewed, required imaging and test results available.  Performed  In preparation for procedure, patient was given 100% FiO2, bronchoscope lubricated and lidocaine given via ETT (9 ml). Sedation: Propofol and Fentanyl infusions   Airway entered and the following bronchi were examined: RUL, RML, RLL, LUL, LLL and Bronchi.   Procedures performed: Brushings performed Bronchoscope removed.  , Patient placed back on 100% FiO2 at conclusion of procedure.    Evaluation Hemodynamic Status: BP stable throughout; O2 sats: stable throughout Patient's Current Condition: stable Specimens:  Sent purulent fluid Complications: No apparent complications Patient did tolerate procedure well.  Mucous plugging in RUL and in Left mainstem bronchus, which were suctioned and removed. Bronchial washing sent for culture. Detailed airway exam of all subsegmental bronchi bilaterally were patent and clear at end of the procedure.   Sharyn Blitz Dent Plantz 11/10/2017

## 2017-11-10 NOTE — Anesthesia Preprocedure Evaluation (Deleted)
Anesthesia Evaluation  Patient identified by MRN, date of birth, ID band Patient unresponsive    Reviewed: Allergy & Precautions, H&P , NPO status , Patient's Chart, lab work & pertinent test results, reviewed documented beta blocker date and time   Airway Mallampati: Intubated       Dental no notable dental hx. (+) Teeth Intact, Dental Advisory Given   Pulmonary COPD,  COPD inhaler, Current Smoker,  VDRF   Pulmonary exam normal breath sounds clear to auscultation       Cardiovascular hypertension, Pt. on medications and Pt. on home beta blockers +CHF   Rhythm:Regular Rate:Normal     Neuro/Psych Anxiety Depression Bipolar Disorder Schizophrenia negative neurological ROS     GI/Hepatic Neg liver ROS, GERD  Medicated,  Endo/Other  diabetes  Renal/GU negative Renal ROS  negative genitourinary   Musculoskeletal   Abdominal   Peds  Hematology  (+) anemia ,   Anesthesia Other Findings   Reproductive/Obstetrics negative OB ROS                             Anesthesia Physical Anesthesia Plan  ASA: IV  Anesthesia Plan: General   Post-op Pain Management:    Induction: Intravenous  PONV Risk Score and Plan: 2 and Ondansetron and Treatment may vary due to age or medical condition  Airway Management Planned: Oral ETT  Additional Equipment:   Intra-op Plan:   Post-operative Plan: Post-operative intubation/ventilation  Informed Consent: I have reviewed the patients History and Physical, chart, labs and discussed the procedure including the risks, benefits and alternatives for the proposed anesthesia with the patient or authorized representative who has indicated his/her understanding and acceptance.   Dental advisory given  Plan Discussed with: CRNA  Anesthesia Plan Comments:         Anesthesia Quick Evaluation

## 2017-11-10 NOTE — Progress Notes (Signed)
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  Patient is confused, on BiPAP with stable vitals and ABG pending.  WBC is normal.  I reviewed CXR myself, pulmonary edema noted.   SUBJECTIVE:  No events overnight, no new complaints  VITAL SIGNS: BP 135/67   Pulse 89   Temp 99.1 F (37.3 C) (Oral)   Resp 18   Ht 5\' 4"  (1.626 m)   Wt 190 lb 11.2 oz (86.5 kg)   SpO2 96%   BMI 32.73 kg/m   HEMODYNAMICS:    VENTILATOR SETTINGS: Vent Mode: PRVC FiO2 (%):  [70 %-100 %] 100 % Set Rate:  [18 bmp] 18 bmp Vt Set:  [440 mL] 440 mL PEEP:  [10 cmH20-12 cmH20] 12 cmH20 Plateau Pressure:  [20 cmH20-25 cmH20] 25 cmH20  INTAKE / OUTPUT: I/O last 3 completed shifts: In: 3771.3 [I.V.:1514.3; NG/GT:407; IV Piggyback:1850] Out: 1736 [Urine:1736]  PHYSICAL EXAMINATION: General:  Acute on chronically appearing female, NAD Neuro:  Arousable, anxious NAD HEENT:  /AT, PERRL, EOM-I and MMM Cardiovascular:  RRR, Nl S1/S2 and -M/R/G Lungs:  Coarse BS diffusely  Abdomen:  Soft, NT, ND and +BS Musculoskeletal:  -edema and -tenderness Skin:  Intact  LABS:  BMET Recent Labs  Lab 11/09/17 1910 11/10/17 0101 11/10/17 0722  NA 131* 132* 130*  K 4.8 4.4 4.5  CL 94* 95* 93*  CO2 28 29 27   BUN 18 23* 27*  CREATININE 0.82 0.79 0.87  GLUCOSE 157* 153* 139*   Electrolytes Recent Labs  Lab 11/09/17 0335  11/09/17 1910 11/10/17 0101 11/10/17 0722  CALCIUM 8.2*   < > 8.3* 8.4* 8.4*   MG 1.4*  --   --  1.9  --   PHOS 3.8  --   --  4.1  --    < > = values in this interval not displayed.   CBC Recent Labs  Lab 11/08/17 0337 11/09/17 0335 11/10/17 0101  WBC 7.2 11.2* 8.2  HGB 9.8* 9.8* 9.2*  HCT 29.4* 28.7* 27.8*  PLT 138* 121* 144*   Coag's Recent Labs  Lab 11/07/17 0550  INR 1.02   Sepsis Markers Recent Labs  Lab 11/08/17 2257 11/09/17 0335 11/10/17 0101  LATICACIDVEN 0.8  --   --   PROCALCITON <0.10 0.16 0.19   ABG Recent Labs  Lab 11/08/17 2133 11/09/17 0135 11/10/17 0416  PHART 7.414 7.434 7.303*  PCO2ART 44.8 43.6 61.0*  PO2ART 58.0* 140* 86.0   Liver Enzymes Recent Labs  Lab 11/04/17 1934  AST 16  ALT 15  ALKPHOS 73  BILITOT 0.1*  ALBUMIN 3.4*   Cardiac Enzymes Recent Labs  Lab 11/07/17 0850  TROPONINI 0.03*   Glucose Recent Labs  Lab 11/09/17 1224 11/09/17 1632 11/09/17 1955 11/10/17 0005 11/10/17 0401 11/10/17 0819  GLUCAP 113* 112* 156* 150* 139* 143*   Imaging Dg Chest Port 1 View  Result Date: 11/10/2017 CLINICAL DATA:  Assess endotracheal tube position. EXAM: PORTABLE CHEST 1 VIEW COMPARISON:  Chest  radiograph performed 11/09/2017 FINDINGS: The patient's endotracheal tube is seen ending 1-2 cm above the carina. An enteric tube is noted extending below the diaphragm. A small left pleural effusion is noted. Retrocardiac airspace opacification may reflect pneumonia, mostly new from the recent prior study. Underlying vascular congestion is noted. No pneumothorax is seen. The cardiomediastinal silhouette is borderline normal in size. No acute osseous abnormalities are identified. IMPRESSION: 1. Endotracheal tube seen ending 1-2 cm above the carina. 2. Small left pleural effusion noted. Retrocardiac airspace opacification may reflect pneumonia, mostly new from the recent prior study. 3. Underlying vascular congestion noted. Electronically Signed   By: Garald Balding M.D.   On: 11/10/2017 05:38   STUDIES:  4/24 echo  with normal EF and grade 1 diastolic dysfunction  CULTURES: Urine 5/16>>> Sputjm 5/17>>>  ANTIBIOTICS: Vanc 5/18>>> Aztreonam 5/18>>>  SIGNIFICANT EVENTS: 5/17 transfer to the ICU for respiratory failure  LINES/TUBES: PIV  I reviewed CXR myself, cardiomegaly and pulmonary edema noted.  DISCUSSION: 57 year old female with history of psychogenic polydipsia that presents with acute on chronic hypoxemic respiratory failure requiring BiPAP.  PCCM consulted for vent management.  ASSESSMENT / PLAN:  PULMONARY A: Acute on chronic hypoxemic respiratory failure.   ABG with alkalosis and severe hypoxemia P:   - Full vent support - Increase PEEP to 14 and drop FiO2 to 70% - Titrate for sat of 88-92% - Lasix as below - Continue bronchodilators as ordered - No need for steroids, do not believe this is a COPD exacerbation - BD as ordered  CARDIOVASCULAR A:  Hypertension P:  - Hold anti-HTN - Lasix as below - Tele monitoring  RENAL A:   Hypokalemia P:   - Lasix 40 mg IV q6 x3 doses - KVO IVF - BMET in AM - Potassium 40 meq PO x2 doses while being diuresed   GASTROINTESTINAL A:   No active issues P:   - PPI - TF per nutrition  HEMATOLOGIC A:   No active issues P:  - CBC in AM - Transfuse per ICU protocol  INFECTIOUS A:   HCAP P:   - Monitor WBC and fever curve - [place] - Vanc - F/U on cultures  ENDOCRINE A:   DM   P:   - CBGs - ISS  NEUROLOGIC A:   AMS, schizophrenic, unknown baseline P:   RASS goal: 00 - Fentanyl drip - Versed PR - D/C propofol - Precedex if needed  FAMILY  - Updates: No family bedside 5/19  - Inter-disciplinary family meet or Palliative Care meeting due by:  day 7  The patient is critically ill with multiple organ systems failure and requires high complexity decision making for assessment and support, frequent evaluation and titration of therapies, application of advanced monitoring technologies and extensive  interpretation of multiple databases.   Critical Care Time devoted to patient care services described in this note is  34  Minutes. This time reflects time of care of this signee Dr Jennet Maduro. This critical care time does not reflect procedure time, or teaching time or supervisory time of PA/NP/Med student/Med Resident etc but could involve care discussion time.  Rush Farmer, M.D. [facility] Pulmonary/Critical Care Medicine. Pager: [phone]. After hours pager: [phone].  11/10/2017, 11:17 AM

## 2017-11-11 ENCOUNTER — Ambulatory Visit: Payer: Self-pay | Admitting: Physician Assistant

## 2017-11-11 ENCOUNTER — Encounter (HOSPITAL_COMMUNITY): Payer: Self-pay | Admitting: Certified Registered Nurse Anesthetist

## 2017-11-11 ENCOUNTER — Inpatient Hospital Stay (HOSPITAL_COMMUNITY): Payer: Medicare Other

## 2017-11-11 ENCOUNTER — Encounter (HOSPITAL_COMMUNITY)
Admission: EM | Disposition: A | Discharge status: Skilled Nursing Facility | Payer: Self-pay | Source: Home / Self Care | Attending: Critical Care Medicine

## 2017-11-11 DIAGNOSIS — J9809 Other diseases of bronchus, not elsewhere classified: Secondary | ICD-10-CM

## 2017-11-11 DIAGNOSIS — N179 Acute kidney failure, unspecified: Secondary | ICD-10-CM

## 2017-11-11 DIAGNOSIS — J181 Lobar pneumonia, unspecified organism: Secondary | ICD-10-CM

## 2017-11-11 DIAGNOSIS — E875 Hyperkalemia: Secondary | ICD-10-CM

## 2017-11-11 LAB — BASIC METABOLIC PANEL
ANION GAP: 9 (ref 5–15)
Anion gap: 10 (ref 5–15)
Anion gap: 8 (ref 5–15)
BUN: 35 mg/dL — ABNORMAL HIGH (ref 6–20)
BUN: 34 mg/dL — ABNORMAL HIGH (ref 6–20)
BUN: 33 mg/dL — ABNORMAL HIGH (ref 6–20)
CALCIUM: 8.3 mg/dL — AB (ref 8.9–10.3)
CALCIUM: 8.4 mg/dL — AB (ref 8.9–10.3)
CALCIUM: 8.4 mg/dL — AB (ref 8.9–10.3)
CHLORIDE: 96 mmol/L — AB (ref 101–111)
CO2: 27 mmol/L (ref 22–32)
CO2: 27 mmol/L (ref 22–32)
CO2: 29 mmol/L (ref 22–32)
CREATININE: 0.68 mg/dL (ref 0.44–1.00)
CREATININE: 0.66 mg/dL (ref 0.44–1.00)
Chloride: 96 mmol/L — ABNORMAL LOW (ref 101–111)
Chloride: 96 mmol/L — ABNORMAL LOW (ref 101–111)
Creatinine, Ser: 0.71 mg/dL (ref 0.44–1.00)
GFR calc Af Amer: >60 mL/min (ref >60)
GFR calc Af Amer: >60 mL/min (ref >60)
GFR calc non Af Amer: >60 mL/min (ref >60)
GLUCOSE: 160 mg/dL — AB (ref 65–99)
Glucose, Bld: 125 mg/dL — ABNORMAL HIGH (ref 65–99)
Glucose, Bld: 118 mg/dL — ABNORMAL HIGH (ref 65–99)
Potassium: 4.7 mmol/L (ref 3.5–5.1)
Potassium: 4.7 mmol/L (ref 3.5–5.1)
Potassium: 4.7 mmol/L (ref 3.5–5.1)
SODIUM: 133 mmol/L — AB (ref 135–145)
SODIUM: 132 mmol/L — AB (ref 135–145)
Sodium: 133 mmol/L — ABNORMAL LOW (ref 135–145)

## 2017-11-11 LAB — POCT I-STAT 3, ART BLOOD GAS (G3+)
ACID-BASE EXCESS: 4 mmol/L — AB (ref 0.0–2.0)
ACID-BASE EXCESS: 5 mmol/L — AB (ref 0.0–2.0)
Bicarbonate: 30.7 mmol/L — ABNORMAL HIGH (ref 20.0–28.0)
Bicarbonate: 30 mmol/L — ABNORMAL HIGH (ref 20.0–28.0)
O2 SAT: 95 %
O2 Saturation: 100 %
PCO2 ART: 46.9 mmHg (ref 32.0–48.0)
PH ART: 7.412 (ref 7.350–7.450)
TCO2: 32 mmol/L (ref 22–32)
TCO2: 31 mmol/L (ref 22–32)
pCO2 arterial: 58.6 mmHg — ABNORMAL HIGH (ref 32.0–48.0)
pH, Arterial: 7.327 — ABNORMAL LOW (ref 7.350–7.450)
pO2, Arterial: 186 mmHg — ABNORMAL HIGH (ref 83.0–108.0)
pO2, Arterial: 76 mmHg — ABNORMAL LOW (ref 83.0–108.0)

## 2017-11-11 LAB — CBC
HEMATOCRIT: 25.9 % — AB (ref 36.0–46.0)
Hemoglobin: 8.4 g/dL — ABNORMAL LOW (ref 12.0–15.0)
MCH: 29.3 pg (ref 26.0–34.0)
MCHC: 32.4 g/dL (ref 30.0–36.0)
MCV: 90.2 fL (ref 78.0–100.0)
Platelets: 185 10*3/uL (ref 150–400)
RBC: 2.87 MIL/uL — ABNORMAL LOW (ref 3.87–5.11)
RDW: 14.4 % (ref 11.5–15.5)
WBC: 8 10*3/uL (ref 4.0–10.5)

## 2017-11-11 LAB — PHOSPHORUS: PHOSPHORUS: 4.1 mg/dL (ref 2.5–4.6)

## 2017-11-11 LAB — CULTURE, RESPIRATORY W GRAM STAIN
Culture: NORMAL
Gram Stain: NONE SEEN

## 2017-11-11 LAB — GLUCOSE, CAPILLARY
GLUCOSE-CAPILLARY: 124 mg/dL — AB (ref 65–99)
GLUCOSE-CAPILLARY: 129 mg/dL — AB (ref 65–99)
GLUCOSE-CAPILLARY: 140 mg/dL — AB (ref 65–99)
Glucose-Capillary: 104 mg/dL — ABNORMAL HIGH (ref 65–99)
Glucose-Capillary: 116 mg/dL — ABNORMAL HIGH (ref 65–99)
Glucose-Capillary: 127 mg/dL — ABNORMAL HIGH (ref 65–99)
Glucose-Capillary: 99 mg/dL (ref 65–99)

## 2017-11-11 LAB — MAGNESIUM: MAGNESIUM: 1.9 mg/dL (ref 1.7–2.4)

## 2017-11-11 LAB — CULTURE, RESPIRATORY

## 2017-11-11 LAB — VANCOMYCIN, TROUGH: Vancomycin Tr: 19 ug/mL (ref 15–20)

## 2017-11-11 SURGERY — FIXATION, FRACTURE, INTERTROCHANTERIC, WITH INTRAMEDULLARY ROD
Anesthesia: General | Laterality: Right

## 2017-11-11 MED ORDER — VALPROIC ACID 250 MG/5ML PO SOLN
125.0000 mg | Freq: Four times a day (QID) | ORAL | Status: DC
  Administered 2017-11-11 – 2017-11-12 (×8): 125 mg
  Filled 2017-11-11 (×11): qty 5

## 2017-11-11 MED ORDER — LURASIDONE HCL 40 MG PO TABS
40.0000 mg | ORAL_TABLET | Freq: Every day | ORAL | Status: DC
  Administered 2017-11-11 – 2017-11-20 (×10): 40 mg via ORAL
  Filled 2017-11-11 (×10): qty 1

## 2017-11-11 MED ORDER — PAROXETINE HCL 20 MG PO TABS
20.0000 mg | ORAL_TABLET | Freq: Every day | ORAL | Status: DC
  Administered 2017-11-11 – 2017-11-20 (×10): 20 mg via ORAL
  Filled 2017-11-11 (×12): qty 1

## 2017-11-11 MED ORDER — TRAZODONE HCL 50 MG PO TABS
50.0000 mg | ORAL_TABLET | Freq: Every day | ORAL | Status: DC
  Administered 2017-11-11 – 2017-11-19 (×9): 50 mg via ORAL
  Filled 2017-11-11 (×10): qty 1

## 2017-11-11 NOTE — Progress Notes (Signed)
PULMONARY / CRITICAL CARE MEDICINE   Name: Natalie Thomas MRN: [id number] DOB: 1961/02/07    ADMISSION DATE:  11/07/2017 CONSULTATION DATE:  11/08/2017  REFERRING MD:  TRH  CHIEF COMPLAINT:   Acute hypoxic respiratory failure.  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  Failed BiPAP and required intubation.  Fall at home and suffered intratrochanteric R hip fracture.   PAST MEDICAL HISTORY :  She  has a past medical history of Acute on chronic respiratory failure with hypoxia and hypercapnia (HCC) (10/07/2017), Anemia (10/16/2017), Anxiety and depression, CHF (congestive heart failure) ([place]), Chronic hyponatremia (04/08/2007), Chronic respiratory failure with hypoxia ([place]) (10/07/2017), Closed comminuted intertrochanteric fracture of right femur ([place]) (11/07/2017), COPD (04/08/2007), Cough (05/08/2011), Depression, Diabetes mellitus, type 2 ([place]), Dyspnea (10/16/2017), Dysuria (04/08/2007), Essential hypertension (04/08/2007), GERD (gastroesophageal reflux disease), HIP PAIN, LEFT (12/12/2007), Hyperlipidemia (10/07/2017), Hypertension, Hyponatremia (07/27/2011), Incontinence of urine (08/14/2011), Lumbar disc disease (04/09/2011), MENOPAUSE, PREMATURE (04/08/2007), NEOPLASM, MALIGNANT, VULVA (04/08/2007), Osteoporosis, Osteoporosis (04/08/2007), Polycythemia secondary to smoking (04/09/2011), S/P right hip fracture (11/07/2017), and Schizoaffective disorder, bipolar type ([place]) (07/05/2011).  PAST SURGICAL HISTORY: She  has a past surgical history that includes Pelvic fracture surgery and Eye surgery.  Allergies  Allergen Reactions  . Clarithromycin Itching  . Penicillins Itching    Has patient had a PCN reaction causing immediate rash, facial/tongue/throat swelling, SOB or lightheadedness with hypotension: No Has patient had a PCN reaction causing severe rash  involving mucus membranes or skin necrosis: No Has patient had a PCN reaction that required hospitalization: Unknown Has patient had a PCN reaction occurring within the last 10 years: No If all of the above answers are "NO", then may proceed with Cephalosporin use.     No current facility-administered medications on file prior to encounter.    Current Outpatient Medications on File Prior to Encounter  Medication Sig  . acetaminophen (TYLENOL) 325 MG tablet Take 650 mg by mouth every 6 (six) hours as needed for mild pain.  Marland Kitchen albuterol (PROVENTIL HFA;VENTOLIN HFA) 108 (90 Base) MCG/ACT inhaler Inhale 2 puffs into the lungs every 4 (four) hours as needed for wheezing or shortness of breath.  . ALPRAZolam (XANAX) 0.5 MG tablet Take 1 tablet (0.5 mg total) by mouth 3 (three) times daily.  Marland Kitchen alum & mag hydroxide-simeth (MINTOX) 527-782-42 MG/5ML suspension Take 30 mLs by mouth every 6 (six) hours as needed for indigestion or heartburn.  Marland Kitchen aspirin 81 MG tablet Take 1 tablet (81 mg total) by mouth daily. For heart health  . buPROPion (WELLBUTRIN XL) 300 MG 24 hr tablet Take 1 tablet (300 mg total) by mouth every morning. (Patient taking differently: Take 300 mg by mouth daily. )  . carvedilol (COREG) 25 MG tablet Take 25 mg by mouth 2 (two) times daily with a meal.  . cloNIDine (CATAPRES - DOSED IN MG/24 HR) 0.3 mg/24hr patch Place 0.3 mg onto the skin once a week. On Monday  . dextromethorphan-guaiFENesin (MUCINEX DM) 30-600 MG 12hr tablet Take 1 tablet by mouth every 12 (twelve) hours as needed for cough.  . divalproex (DEPAKOTE) 250 MG DR tablet Take 1 tablet (250 mg total) by mouth every 12 (twelve) hours.  . ferrous sulfate 325 (65 FE) MG tablet Take 325 mg by mouth daily with breakfast.  . fluticasone (FLONASE) 50 MCG/ACT nasal spray Place 2 sprays into both nostrils  2 (two) times daily.  . fluticasone furoate-vilanterol (BREO ELLIPTA) 100-25 MCG/INH AEPB Inhale 1 puff into the lungs daily.  Marland Kitchen  guaifenesin (ROBITUSSIN) 100 MG/5ML syrup Take 200 mg by mouth 3 (three) times daily as needed for cough.  . hydrALAZINE (APRESOLINE) 100 MG tablet Take 100 mg by mouth every 8 (eight) hours.  . hydrOXYzine (ATARAX/VISTARIL) 25 MG tablet Take 1 tablet (25 mg total) by mouth 3 (three) times daily as needed for anxiety.  Marland Kitchen ipratropium-albuterol (DUONEB) 0.5-2.5 (3) MG/3ML SOLN Take 3 mLs by nebulization 3 (three) times daily as needed (SOb).  . lurasidone (LATUDA) 80 MG TABS tablet Take 1 tablet (80 mg total) by mouth daily with breakfast. (Patient taking differently: Take 40 mg by mouth daily with breakfast. )  . magnesium hydroxide (MILK OF MAGNESIA) 400 MG/5ML suspension Take 30 mLs by mouth at bedtime as needed for mild constipation.  . magnesium oxide (MAG-OX) 400 (241.3 Mg) MG tablet Take 2 tablets (800 mg total) by mouth 2 (two) times daily.  . Melatonin 10 MG TABS Take 10 mg by mouth at bedtime.  . Menthol, Topical Analgesic, (BIOFREEZE COLORLESS EX) Apply 1 application topically 3 (three) times daily. TO UPPER ABDOMEN  . Methylcellulose, Laxative, 500 MG TABS Take 500 mg by mouth at bedtime.  . Multiple Vitamin (DAILY-VITE PO) Take 1 tablet by mouth daily.  Marland Kitchen neomycin-bacitracin-polymyxin (NEOSPORIN) OINT Apply 1 application topically daily.   Marland Kitchen NIFEdipine (PROCARDIA XL/ADALAT-CC) 90 MG 24 hr tablet Take 90 mg by mouth daily.  Marland Kitchen omeprazole (PRILOSEC) 20 MG capsule Take 1 capsule (20 mg total) by mouth 2 (two) times daily before a meal. For acid reflux.  Marland Kitchen oxyCODONE (OXY IR/ROXICODONE) 5 MG immediate release tablet Take 1 tablet (5 mg total) by mouth every 6 (six) hours as needed for severe pain.  Marland Kitchen PARoxetine (PAXIL) 20 MG tablet Take 1 tablet (20 mg total) by mouth daily.  . polyethylene glycol (MIRALAX) packet Take 17 g by mouth daily as needed.  . pravastatin (PRAVACHOL) 20 MG tablet Take 20 mg by mouth daily.  . sodium chloride 1 g tablet Take 1 g by mouth 3 (three) times daily with  meals.   . sucralfate (CARAFATE) 1 g tablet Take 1 g by mouth 4 (four) times daily.  . traZODone (DESYREL) 50 MG tablet Take 50 mg by mouth at bedtime.  . dicyclomine (BENTYL) 20 MG tablet Take 1 tablet (20 mg total) by mouth 2 (two) times daily.    FAMILY HISTORY:  Her has no family status information on file.    SOCIAL HISTORY: She  reports that she has been smoking cigarettes.  She has been smoking about 0.25 packs per day. She has never used smokeless tobacco. She reports that she does not drink alcohol or use drugs.  REVIEW OF SYSTEMS:   Limited history due to mental status.   SUBJECTIVE:  Hypoxia overnight with mucus plugging required bronchoscopy. Still high O2 requirements.   VITAL SIGNS: BP (!) 124/50   Pulse (!) 59   Temp 97.9 F (36.6 C) (Oral)   Resp 20   Ht 5\' 4"  (1.626 m)   Wt 190 lb 14.7 oz (86.6 kg)   SpO2 100%   BMI 32.77 kg/m   HEMODYNAMICS:  No vasopressors.  VENTILATOR SETTINGS: Vent Mode: PRVC FiO2 (%):  [50 %-100 %] 50 % Set Rate:  [18 bmp-20 bmp] 20 bmp Vt Set:  [440 mL] 440 mL PEEP:  [12 cmH20-14 cmH20] 12 cmH20 Plateau Pressure:  [  24 cmH20-27 cmH20] 24 cmH20  INTAKE / OUTPUT: I/O last 3 completed shifts: In: 3613.2 [I.V.:1982.2; NG/GT:581; IV Piggyback:1050] Out: 1660 [Urine:1660]  PHYSICAL EXAMINATION: General:  Well nourished but looks older than stated age. Neuro:  Sedated on propofol and fentanyl. Currently RASS-5.  HEENT:  Mucus membrane. ETT and OGT in place, Cardiovascular:  Normotensive on no pressors. Extremities warm. Normal HS.  Lungs:  Vesicular breath sounds throughout with no crackles or wheezing. Abdomen:  BS + soft no masses. Musculoskeletal:  No edema. Ext rotated R hip with no swelling.  Skin:  No bruising.  LABS:  BMET Recent Labs  Lab 11/10/17 2007 11/11/17 0158 11/11/17 0446  NA 134* 133* 132*  K 5.2* 4.7 4.7  CL 98* 96* 96*  CO2 28 27 27   BUN 30* 35* 34*  CREATININE 0.82 0.71 0.68  GLUCOSE 146* 160*  125*    Electrolytes Recent Labs  Lab 11/09/17 0335  11/10/17 0101  11/10/17 2007 11/11/17 0158 11/11/17 0446  CALCIUM 8.2*   < > 8.4*   < > 8.5* 8.3* 8.4*  MG 1.4*  --  1.9  --   --   --  1.9  PHOS 3.8  --  4.1  --   --   --  4.1   < > = values in this interval not displayed.    CBC Recent Labs  Lab 11/09/17 0335 11/10/17 0101 11/11/17 0446  WBC 11.2* 8.2 8.0  HGB 9.8* 9.2* 8.4*  HCT 28.7* 27.8* 25.9*  PLT 121* 144* 185    Coag's Recent Labs  Lab 11/07/17 0550  INR 1.02    Sepsis Markers Recent Labs  Lab 11/08/17 2257 11/09/17 0335 11/10/17 0101 11/10/17 2243  LATICACIDVEN 0.8  --   --  0.9  PROCALCITON <0.10 0.16 0.19  --     ABG Recent Labs  Lab 11/10/17 2317 11/11/17 0116 11/11/17 0455  PHART 7.332* 7.327* 7.412  PCO2ART 56.2* 58.6* 46.9  PO2ART 92.0 186.0* 76.0*    Liver Enzymes Recent Labs  Lab 11/04/17 1934  AST 16  ALT 15  ALKPHOS 73  BILITOT 0.1*  ALBUMIN 3.4*    Cardiac Enzymes Recent Labs  Lab 11/07/17 0850  TROPONINI 0.03*    Glucose Recent Labs  Lab 11/10/17 1231 11/10/17 1616 11/10/17 2000 11/11/17 0017 11/11/17 0421 11/11/17 0826  GLUCAP 139* 128* 135* 140* 124* 99    Imaging Dg Chest Port 1 View  Result Date: 11/11/2017 CLINICAL DATA:  History of endotracheal tube EXAM: PORTABLE CHEST 1 VIEW COMPARISON:  Yesterday FINDINGS: Endotracheal tube tip 2 cm above the carina. An orogastric tube reaches the stomach. Tubing over the left chest is attributed to nasogastric tube external to the patient. There is artifact from EKG leads. Improved aeration on the left, but still complete opacification of the lower lobe. No Kerley lines or pneumothorax. IMPRESSION: 1. Stable positioning of endotracheal and orogastric tubes. 2. Improved aeration on the left, but persistent lower lobe collapse. Electronically Signed   By: Monte Fantasia M.D.   On: 11/11/2017 07:07   Dg Chest Port 1 View  Result Date: 11/10/2017 CLINICAL  DATA:  Oxygen desaturation EXAM: PORTABLE CHEST 1 VIEW COMPARISON:  11/10/2017, 11/09/2017, CT 11/08/2017 FINDINGS: Endotracheal tube tip is about 1 cm superior to the carina. Esophageal tube tip is below the diaphragm but non included. Mild mediastinal shift to the left. Persistent dense left lung base consolidation and probable small left effusion. Aortic atherosclerosis. Cardiomegaly with vascular congestion. No pneumothorax. IMPRESSION:  1. Endotracheal tube tip about 1 cm superior to carina 2. Increased hazy opacity of left thorax, possible layering effusion. Cardiomegaly with dense left lung base consolidation as before. Electronically Signed   By: Donavan Foil M.D.   On: 11/10/2017 22:28     STUDIES:  N/A  CULTURES: N/A  ANTIBIOTICS: Azetreonam 5/18 Vancomycin 5/18  SIGNIFICANT EVENTS: Required bronchoscopy 5/20.  ORIF right hip has been postponed.  LINES/TUBES: ETT, OGT, arterial line. PIV's  DISCUSSION: Encephalopathy and respiratory failure due to suspected LLL pneumonia with subsequent hip fracture from fall.   ASSESSMENT / PLAN:  PULMONARY A: Acute respiratory failure with poor secretion clearance due to CAP P:   Continue low tidal volume ventilation. Wean as tolerated. Increase pulmonary toilette Lighten sedation to improve cough. VAP bundle SBT - not currently appropriate.  CARDIOVASCULAR A:  Normotensive.  Was on clonidine at home.  P:  Continue monitor. Secondary Prevention: N/A  RENAL A:   Hyponatremia likely hypovolemic.  Volume Status: dry to euvolemic P:   Continue gentle hydration. Foley: required to measure urine output precisely.   GASTROINTESTINAL A:   Nutritional Status: low nutrition risk. P:   Start feeds.   HEMATOLOGIC A:   Anemia of chronic disease +/- acute blood loss from hip fracture. DVT prophylaxis: Enoxaparin at hip fracture dose.; Transfusion requirements: none. P:  Follow HB  INFECTIOUS A:   Radiologic pneumonia  with GPC's and PMNs on tracheal aspirate. P:   Continue antibiotics for 7 days. Line Removal: D/C arterial line.  ENDOCRINE A:   Euglycemic on SSI   Glycemic control: SSI; Stress steroids: no P:   Continue current regimen  NEUROLOGIC A:   Encephalopathic. Oversedated.  P:   RASS goal: 0 Resume psychiatric medications and wean sedation.   FAMILY  - Updates: has a legal guardian, but updates recently.  - Inter-disciplinary family meet or Palliative Care meeting due by:  day 7  CRITICAL CARE Performed by: Kipp Brood  Total critical care time: 40 minutes  Critical care time was exclusive of separately billable procedures and treating other patients.  Critical care was necessary to treat or prevent imminent or life-threatening deterioration.  Critical care was time spent personally by me on the following activities: development of treatment plan with patient and/or surrogate as well as nursing, discussions with consultants, evaluation of patient's response to treatment, examination of patient, obtaining history from patient or surrogate, ordering and performing treatments and interventions, ordering and review of laboratory studies, ordering and review of radiographic studies, pulse oximetry and re-evaluation of patient's condition.  Kipp Brood, MD [facility] Pulmonary and [place] Pager: [phone] After hours [phone]  11/11/2017, 9:40 AM

## 2017-11-11 NOTE — Progress Notes (Signed)
Still awaiting patient to be medically optimized for surgical fixation of hip. Please contact orthopaedic team once pulmonary status has improved to allow for surgical fixation.  Shona Needles, MD [facility] [phone] (phone)

## 2017-11-11 NOTE — Progress Notes (Signed)
Pharmacy Antibiotic Note  Natalie Thomas is a 57 y.o. female admitted on 11/07/2017 with pneumonia.  Pharmacy has been consulted for Vanc dosing.  Plan: Vanc trough came back at 19, w/n target range of 15 - 20. This patient's current antibiotics will be continued without adjustments.  Height: 5\' 4"  (162.6 cm) Weight: 190 lb 14.7 oz (86.6 kg) IBW/kg (Calculated) : 54.7  Temp (24hrs), Avg:98.3 F (36.8 C), Min:97.9 F (36.6 C), Max:99 F (37.2 C)  Recent Labs  Lab 11/07/17 0550  11/08/17 0337  11/08/17 2257 11/09/17 0335  11/10/17 0101  11/10/17 1236 11/10/17 2007 11/10/17 2243 11/11/17 0158 11/11/17 0446 11/11/17 1247  WBC 11.5*  --  7.2  --   --  11.2*  --  8.2  --   --   --   --   --  8.0  --   CREATININE 0.51   < > 0.63   < >  --  0.69   < > 0.79   < > 0.86 0.82  --  0.71 0.68 0.66  LATICACIDVEN  --   --   --   --  0.8  --   --   --   --   --   --  0.9  --   --   --   VANCOTROUGH  --   --   --   --   --   --   --   --   --   --   --   --   --   --  19   < > = values in this interval not displayed.    Estimated Creatinine Clearance: 83.7 mL/min (by C-G formula based on SCr of 0.66 mg/dL).    Allergies  Allergen Reactions  . Clarithromycin Itching  . Penicillins Itching    Has patient had a PCN reaction causing immediate rash, facial/tongue/throat swelling, SOB or lightheadedness with hypotension: No Has patient had a PCN reaction causing severe rash involving mucus membranes or skin necrosis: No Has patient had a PCN reaction that required hospitalization: Unknown Has patient had a PCN reaction occurring within the last 10 years: No If all of the above answers are "NO", then may proceed with Cephalosporin use.     Antimicrobials this admission: Vanc 5/18 >>  Aztreonam 5/18 >>  Microbiology results: Sputum: GPCs  Thank you for allowing pharmacy to be a part of this patient's care.  Corinda Gubler, PharmD, [facility] 11/11/2017 1:58 PM

## 2017-11-12 LAB — GLUCOSE, CAPILLARY
GLUCOSE-CAPILLARY: 120 mg/dL — AB (ref 65–99)
Glucose-Capillary: 123 mg/dL — ABNORMAL HIGH (ref 65–99)
Glucose-Capillary: 116 mg/dL — ABNORMAL HIGH (ref 65–99)
Glucose-Capillary: 128 mg/dL — ABNORMAL HIGH (ref 65–99)
Glucose-Capillary: 112 mg/dL — ABNORMAL HIGH (ref 65–99)
Glucose-Capillary: 126 mg/dL — ABNORMAL HIGH (ref 65–99)

## 2017-11-12 LAB — BLOOD GAS, ARTERIAL
Acid-Base Excess: 4.3 mmol/L — ABNORMAL HIGH (ref 0.0–2.0)
BICARBONATE: 28.9 mmol/L — AB (ref 20.0–28.0)
Drawn by: 252031
FIO2: 40
LHR: 20 {breaths}/min
O2 Saturation: 95.2 %
PEEP: 12 cmH2O
Patient temperature: 98.6
VT: 440 mL
pCO2 arterial: 48.4 mmHg — ABNORMAL HIGH (ref 32.0–48.0)
pH, Arterial: 7.394 (ref 7.350–7.450)
pO2, Arterial: 74.7 mmHg — ABNORMAL LOW (ref 83.0–108.0)

## 2017-11-12 LAB — CBC
HEMATOCRIT: 25.5 % — AB (ref 36.0–46.0)
Hemoglobin: 8 g/dL — ABNORMAL LOW (ref 12.0–15.0)
MCH: 28.6 pg (ref 26.0–34.0)
MCHC: 31.4 g/dL (ref 30.0–36.0)
MCV: 91.1 fL (ref 78.0–100.0)
PLATELETS: 214 10*3/uL (ref 150–400)
RBC: 2.8 MIL/uL — AB (ref 3.87–5.11)
RDW: 14.3 % (ref 11.5–15.5)
WBC: 7.8 10*3/uL (ref 4.0–10.5)

## 2017-11-12 LAB — BASIC METABOLIC PANEL
ANION GAP: 7 (ref 5–15)
BUN: 31 mg/dL — ABNORMAL HIGH (ref 6–20)
CO2: 30 mmol/L (ref 22–32)
Calcium: 8.4 mg/dL — ABNORMAL LOW (ref 8.9–10.3)
Chloride: 99 mmol/L — ABNORMAL LOW (ref 101–111)
Creatinine, Ser: 0.53 mg/dL (ref 0.44–1.00)
GFR calc Af Amer: >60 mL/min (ref >60)
GFR calc non Af Amer: >60 mL/min (ref >60)
GLUCOSE: 130 mg/dL — AB (ref 65–99)
Potassium: 4.4 mmol/L (ref 3.5–5.1)
Sodium: 136 mmol/L (ref 135–145)

## 2017-11-12 MED ORDER — HYDROMORPHONE HCL 1 MG/ML IJ SOLN: 2.0000 mg | INTRAMUSCULAR | Status: DC | PRN

## 2017-11-12 MED ORDER — HYDROMORPHONE HCL 1 MG/ML IJ SOLN
2.0000 mg | Freq: Four times a day (QID) | INTRAMUSCULAR | Status: DC
  Administered 2017-11-12 – 2017-11-14 (×8): 2 mg via INTRAVENOUS
  Filled 2017-11-12 (×9): qty 2

## 2017-11-12 NOTE — Clinical Social Work Note (Signed)
Clinical Social Work Assessment  Patient Details  Name: Natalie Thomas MRN: [id number] Date of Birth: [birth year]  Date of referral:  11/12/17               Reason for consult:  Facility Placement(pt from [facility] )                Permission sought to share information with:  Guardian(Cherly Willmore ) Permission granted to share information::  Yes, Release of Information Signed  Name::     Ezra Sites   Agency::  guardian   Relationship::  guardian   Contact Information:  Ezra Sites   Housing/Transportation Living arrangements for the past 2 months:  Skilled Nursing Facility([facility] ) Source of Information:  Guardian Patient Interpreter Needed:  None Criminal Activity/Legal Involvement Pertinent to Current Situation/Hospitalization:  No - Comment as needed Significant Relationships:  Other Family Members(an aunt and DSS legal Guardian ) Lives with:  Facility Resident Do you feel safe going back to the place where you live?  Yes Need for family participation in patient care:  Yes (Comment)  Care giving concerns:  CSW spoke with pt's legal guardian Ms. Willmore via phone. CSW was informed that at this time she denies having any concerns. Ms. Earlyne Iba expressed that she was not informed that pt was even at the hospital and inquired more information on what is medically going on with pt. CSW directed Ms. Willmore to unit staff for further answers to medical questions.    Social Worker assessment / plan:  CSW spoke with pt's LG Ms. Earlyne Iba via phone. CSW was informed that pt has been involved wit DSS since 2013 and that they are now pt's legal guardian. CSW was informed that the plan is for pt to return to [facility] once pt is medically stable. Ms. Earlyne Iba expressed that supports for pt include an aunt and then the staff at [facility].   Employment status:  Other (Comment)(unknown at this time. ) Insurance information:  Managed Medicare([employer]HC) PT Recommendations:   Not assessed at this time Information / Referral to community resources:  [place]  Patient/Family's Response to care:  Ms. Willmore's response to pt's care is still being examined as she speak with unit staff and MD for further information and answers to questions.   Patient/Family's Understanding of and Emotional Response to Diagnosis, Current Treatment, and Prognosis:  No further questions have been presented to CSW at this time. Pt's legal guardians understanding of care is still unclear as LG is unsure as to what needs pt has at this time. Emotional response from Ms. Willmore was supportive and concerned with pt's needs.   Emotional Assessment Appearance:  Appears stated age Attitude/Demeanor/Rapport:  Unable to Assess Affect (typically observed):  Unable to Assess Orientation:  (pt intubated) Alcohol / Substance use:  Not Applicable Psych involvement (Current and /or in the community):  No (Comment)(not at this time. )  Discharge Needs  Concerns to be addressed:  Denies Needs/Concerns at this time Readmission within the last 30 days:  No Current discharge risk:  Dependent with Mobility, Other(pt remains intubated. ) Barriers to Discharge:  Continued Medical Work up   [employer], [place] 11/12/2017, 2:50 PM

## 2017-11-12 NOTE — Progress Notes (Signed)
CSW reached back out to [employer] with [facility]. CSW left message for Ms. Willmore asking that she call CSW back to complete assessment on pt. CSW awaiting returned call at this time.    Natalie Thomas, MSW, [place] [facility] Clinical Social Worker [phone]

## 2017-11-12 NOTE — Progress Notes (Signed)
eLink Physician-Brief Progress Note Patient Name: LENELL MCCONNELL DOB: [birth year] MRN: [id number]   Date of Service  11/12/2017  HPI/Events of Note  Agitation - Request to renew restraint orders.   eICU Interventions  Restraint orders renewed.      Intervention Category Minor Interventions: Agitation / anxiety - evaluation and management  Lysle Dingwall 11/12/2017, 8:41 PM

## 2017-11-12 NOTE — Progress Notes (Addendum)
10:01am- CSW received call back from [facility]. CSW was informed that Mr. West no longer works for [employer] and that the new Education officer, museum is Passenger transport manager [phone].  CSW has provided this information to RN at this time. CSW attempted to gather information from Education officer, museum and was informed that she was unable to speak with CSW for a long time as she was headed to meetings. CSW to call back after meetings to gather more information on pt. Malachy Mood did express that she able to give consent for pt however can not at the moment due to meetings.    CSW consulted for finding pt's social worker via [place]. CSW reached out to [facility] [phone] and left VM asking that he call CSW back for collateral information at this time.  CSW aware that pt remains intubated and unable to speak with CSW. CSW will speak with DeCarlos once call is returned. CSW will continue to follow for further needs.    Virgie Dad Lizza Huffaker, MSW, [place] [facility] Clinical Social Worker [phone]

## 2017-11-12 NOTE — Progress Notes (Signed)
PULMONARY / CRITICAL CARE MEDICINE   Name: Natalie Thomas MRN: [id number] DOB: 09/06/1960    ADMISSION DATE:  11/07/2017 CONSULTATION DATE:  11/08/2017  REFERRING MD:  TRH  CHIEF COMPLAINT:   Acute hypoxic respiratory failure.  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  Failed BiPAP and required intubation.  Fall at home and suffered intratrochanteric R hip fracture.   PAST MEDICAL HISTORY :  She  has a past medical history of Acute on chronic respiratory failure with hypoxia and hypercapnia (HCC) (10/07/2017), Anemia (10/16/2017), Anxiety and depression, CHF (congestive heart failure) ([place]), Chronic hyponatremia (04/08/2007), Chronic respiratory failure with hypoxia ([place]) (10/07/2017), Closed comminuted intertrochanteric fracture of right femur ([place]) (11/07/2017), COPD (04/08/2007), Cough (05/08/2011), Depression, Diabetes mellitus, type 2 ([place]), Dyspnea (10/16/2017), Dysuria (04/08/2007), Essential hypertension (04/08/2007), GERD (gastroesophageal reflux disease), HIP PAIN, LEFT (12/12/2007), Hyperlipidemia (10/07/2017), Hypertension, Hyponatremia (07/27/2011), Incontinence of urine (08/14/2011), Lumbar disc disease (04/09/2011), MENOPAUSE, PREMATURE (04/08/2007), NEOPLASM, MALIGNANT, VULVA (04/08/2007), Osteoporosis, Osteoporosis (04/08/2007), Polycythemia secondary to smoking (04/09/2011), S/P right hip fracture (11/07/2017), and Schizoaffective disorder, bipolar type ([place]) (07/05/2011).  PAST SURGICAL HISTORY: She  has a past surgical history that includes Pelvic fracture surgery and Eye surgery.  Allergies  Allergen Reactions  . Clarithromycin Itching  . Penicillins Itching    Has patient had a PCN reaction causing immediate rash, facial/tongue/throat swelling, SOB or lightheadedness with hypotension: No Has patient had a PCN reaction causing severe rash  involving mucus membranes or skin necrosis: No Has patient had a PCN reaction that required hospitalization: Unknown Has patient had a PCN reaction occurring within the last 10 years: No If all of the above answers are "NO", then may proceed with Cephalosporin use.     No current facility-administered medications on file prior to encounter.    Current Outpatient Medications on File Prior to Encounter  Medication Sig  . acetaminophen (TYLENOL) 325 MG tablet Take 650 mg by mouth every 6 (six) hours as needed for mild pain.  Marland Kitchen albuterol (PROVENTIL HFA;VENTOLIN HFA) 108 (90 Base) MCG/ACT inhaler Inhale 2 puffs into the lungs every 4 (four) hours as needed for wheezing or shortness of breath.  . ALPRAZolam (XANAX) 0.5 MG tablet Take 1 tablet (0.5 mg total) by mouth 3 (three) times daily.  Marland Kitchen alum & mag hydroxide-simeth (MINTOX) 970-263-78 MG/5ML suspension Take 30 mLs by mouth every 6 (six) hours as needed for indigestion or heartburn.  Marland Kitchen aspirin 81 MG tablet Take 1 tablet (81 mg total) by mouth daily. For heart health  . buPROPion (WELLBUTRIN XL) 300 MG 24 hr tablet Take 1 tablet (300 mg total) by mouth every morning. (Patient taking differently: Take 300 mg by mouth daily. )  . carvedilol (COREG) 25 MG tablet Take 25 mg by mouth 2 (two) times daily with a meal.  . cloNIDine (CATAPRES - DOSED IN MG/24 HR) 0.3 mg/24hr patch Place 0.3 mg onto the skin once a week. On Monday  . dextromethorphan-guaiFENesin (MUCINEX DM) 30-600 MG 12hr tablet Take 1 tablet by mouth every 12 (twelve) hours as needed for cough.  . divalproex (DEPAKOTE) 250 MG DR tablet Take 1 tablet (250 mg total) by mouth every 12 (twelve) hours.  . ferrous sulfate 325 (65 FE) MG tablet Take 325 mg by mouth daily with breakfast.  . fluticasone (FLONASE) 50 MCG/ACT nasal spray Place 2 sprays into both nostrils  2 (two) times daily.  . fluticasone furoate-vilanterol (BREO ELLIPTA) 100-25 MCG/INH AEPB Inhale 1 puff into the lungs daily.  Marland Kitchen  guaifenesin (ROBITUSSIN) 100 MG/5ML syrup Take 200 mg by mouth 3 (three) times daily as needed for cough.  . hydrALAZINE (APRESOLINE) 100 MG tablet Take 100 mg by mouth every 8 (eight) hours.  . hydrOXYzine (ATARAX/VISTARIL) 25 MG tablet Take 1 tablet (25 mg total) by mouth 3 (three) times daily as needed for anxiety.  Marland Kitchen ipratropium-albuterol (DUONEB) 0.5-2.5 (3) MG/3ML SOLN Take 3 mLs by nebulization 3 (three) times daily as needed (SOb).  . lurasidone (LATUDA) 80 MG TABS tablet Take 1 tablet (80 mg total) by mouth daily with breakfast. (Patient taking differently: Take 40 mg by mouth daily with breakfast. )  . magnesium hydroxide (MILK OF MAGNESIA) 400 MG/5ML suspension Take 30 mLs by mouth at bedtime as needed for mild constipation.  . magnesium oxide (MAG-OX) 400 (241.3 Mg) MG tablet Take 2 tablets (800 mg total) by mouth 2 (two) times daily.  . Melatonin 10 MG TABS Take 10 mg by mouth at bedtime.  . Menthol, Topical Analgesic, (BIOFREEZE COLORLESS EX) Apply 1 application topically 3 (three) times daily. TO UPPER ABDOMEN  . Methylcellulose, Laxative, 500 MG TABS Take 500 mg by mouth at bedtime.  . Multiple Vitamin (DAILY-VITE PO) Take 1 tablet by mouth daily.  Marland Kitchen neomycin-bacitracin-polymyxin (NEOSPORIN) OINT Apply 1 application topically daily.   Marland Kitchen NIFEdipine (PROCARDIA XL/ADALAT-CC) 90 MG 24 hr tablet Take 90 mg by mouth daily.  Marland Kitchen omeprazole (PRILOSEC) 20 MG capsule Take 1 capsule (20 mg total) by mouth 2 (two) times daily before a meal. For acid reflux.  Marland Kitchen oxyCODONE (OXY IR/ROXICODONE) 5 MG immediate release tablet Take 1 tablet (5 mg total) by mouth every 6 (six) hours as needed for severe pain.  Marland Kitchen PARoxetine (PAXIL) 20 MG tablet Take 1 tablet (20 mg total) by mouth daily.  . polyethylene glycol (MIRALAX) packet Take 17 g by mouth daily as needed.  . pravastatin (PRAVACHOL) 20 MG tablet Take 20 mg by mouth daily.  . sodium chloride 1 g tablet Take 1 g by mouth 3 (three) times daily with  meals.   . sucralfate (CARAFATE) 1 g tablet Take 1 g by mouth 4 (four) times daily.  . traZODone (DESYREL) 50 MG tablet Take 50 mg by mouth at bedtime.  . dicyclomine (BENTYL) 20 MG tablet Take 1 tablet (20 mg total) by mouth 2 (two) times daily.    FAMILY HISTORY:  Her has no family status information on file.    SOCIAL HISTORY: She  reports that she has been smoking cigarettes.  She has been smoking about 0.25 packs per day. She has never used smokeless tobacco. She reports that she does not drink alcohol or use drugs.  REVIEW OF SYSTEMS:   Limited history due to mental status.   SUBJECTIVE:  Weaning oxygenation overnight,   VITAL SIGNS: BP 134/65   Pulse 69   Temp (!) 101.3 F (38.5 C) (Oral)   Resp 20   Ht 5\' 4"  (1.626 m)   Wt 193 lb 12.6 oz (87.9 kg)   SpO2 94%   BMI 33.26 kg/m   HEMODYNAMICS:  No vasopressors.  VENTILATOR SETTINGS: Vent Mode: PRVC FiO2 (%):  [40 %-50 %] 40 % Set Rate:  [20 bmp] 20 bmp Vt Set:  [440 mL] 440 mL PEEP:  [12 cmH20] 12 cmH20 Plateau Pressure:  [22 PXT06-26 cmH20] 23 cmH20  INTAKE / OUTPUT: I/O last  3 completed shifts: In: 4489.8 [I.V.:2522.3; NG/GT:967.5; IV Piggyback:1000] Out: 3785 [Urine:1550; Emesis/NG output:175]  PHYSICAL EXAMINATION: General:  Well nourished but looks older than stated age. Neuro:  Sedated on propofol and fentanyl. Currently RASS-3  HEENT:  Mucus membrane. ETT and OGT in place, Cardiovascular:  Normotensive on no pressors. Extremities warm. Normal HS.  Lungs:  Vesicular breath sounds throughout with no crackles or wheezing. Minimal secretions. Abdomen:  BS + soft no masses. Musculoskeletal:  No edema. Ext rotated R hip with no swelling.  Skin:  No bruising.  LABS:  BMET Recent Labs  Lab 11/11/17 0446 11/11/17 1247 11/12/17 0433  NA 132* 133* 136  K 4.7 4.7 4.4  CL 96* 96* 99*  CO2 27 29 30   BUN 34* 33* 31*  CREATININE 0.68 0.66 0.53  GLUCOSE 125* 118* 130*    Electrolytes Recent Labs   Lab 11/09/17 0335  11/10/17 0101  11/11/17 0446 11/11/17 1247 11/12/17 0433  CALCIUM 8.2*   < > 8.4*   < > 8.4* 8.4* 8.4*  MG 1.4*  --  1.9  --  1.9  --   --   PHOS 3.8  --  4.1  --  4.1  --   --    < > = values in this interval not displayed.    CBC Recent Labs  Lab 11/10/17 0101 11/11/17 0446 11/12/17 0433  WBC 8.2 8.0 7.8  HGB 9.2* 8.4* 8.0*  HCT 27.8* 25.9* 25.5*  PLT 144* 185 214    Coag's Recent Labs  Lab 11/07/17 0550  INR 1.02    Sepsis Markers Recent Labs  Lab 11/08/17 2257 11/09/17 0335 11/10/17 0101 11/10/17 2243  LATICACIDVEN 0.8  --   --  0.9  PROCALCITON <0.10 0.16 0.19  --     ABG Recent Labs  Lab 11/11/17 0116 11/11/17 0455 11/12/17 0305  PHART 7.327* 7.412 7.394  PCO2ART 58.6* 46.9 48.4*  PO2ART 186.0* 76.0* 74.7*    Liver Enzymes No results for input(s): AST, ALT, ALKPHOS, BILITOT, ALBUMIN in the last 168 hours.  Cardiac Enzymes Recent Labs  Lab 11/07/17 0850  TROPONINI 0.03*    Glucose Recent Labs  Lab 11/11/17 1143 11/11/17 1552 11/11/17 2000 11/11/17 2334 11/12/17 0400 11/12/17 0856  GLUCAP 104* 129* 116* 127* 123* 116*    Imaging No results found.   STUDIES:  N/A  CULTURES: N/A  ANTIBIOTICS: Azetreonam 5/18 Vancomycin 5/18  SIGNIFICANT EVENTS: Required bronchoscopy 5/20.  ORIF right hip has been postponed.  LINES/TUBES: ETT, OGT, arterial line. PIV's  DISCUSSION: Encephalopathy and respiratory failure due to suspected LLL pneumonia with subsequent hip fracture from fall.   ASSESSMENT / PLAN:  PULMONARY A: Acute respiratory failure with poor secretion clearance due to CAP.  Has tolerated weaning.  P:   Continue low tidal volume ventilation. Wean as tolerated. Increase pulmonary toilette Lighten sedation to improve cough. VAP bundle SBT - not currently appropriate.  CARDIOVASCULAR A:  Normotensive.  Was on clonidine at home.  P:  Continue monitor. Secondary Prevention:  N/A  RENAL A:   Hyponatremia likely hypovolemic, now corrected. Volume Status: dry to euvolemic P:   Continue gentle hydration. Foley: required to measure urine output precisely.   GASTROINTESTINAL A:   Nutritional Status: low nutrition risk. P:   Start feeds.   HEMATOLOGIC A:   Anemia of chronic disease +/- acute blood loss from hip fracture. DVT prophylaxis: Enoxaparin at hip fracture dose.; Transfusion requirements: none. P:  Follow HB  INFECTIOUS A:  Radiologic pneumonia with GPC's and PMNs on tracheal aspirate. P:   Continue antibiotics for 7 days. Line Removal: D/C arterial line.  ENDOCRINE A:   Euglycemic on SSI   Glycemic control: SSI; Stress steroids: no P:   Continue current regimen  NEUROLOGIC A:   Encephalopathic. Still oversedated.  P:   RASS goal: 0 Switch to intermittent hydromorphone.  Wean propofol.  Should be less agitated with home psychiatric medications.   FAMILY  - Updates: has a legal guardian, but updates recently.  - Inter-disciplinary family meet or Palliative Care meeting due by:  day 7  CRITICAL CARE Performed by: Kipp Brood  Total critical care time: 40 minutes  Critical care time was exclusive of separately billable procedures and treating other patients.  Critical care was necessary to treat or prevent imminent or life-threatening deterioration.  Critical care was time spent personally by me on the following activities: development of treatment plan with patient and/or surrogate as well as nursing, discussions with consultants, evaluation of patient's response to treatment, examination of patient, obtaining history from patient or surrogate, ordering and performing treatments and interventions, ordering and review of laboratory studies, ordering and review of radiographic studies, pulse oximetry and re-evaluation of patient's condition.  Kipp Brood, MD [facility] [place] Pager: [phone] After hours [phone]  11/12/2017, 10:40 AM

## 2017-11-12 NOTE — Progress Notes (Signed)
Orthopedic Tech Progress Note Patient Details:  Natalie Thomas [birth year] [id number]  Musculoskeletal Traction Type of Traction: [place] Skin Traction Traction Location: RLE Traction Weight: 5 lbs   Post Interventions Patient Tolerated: Well Instructions Provided: Care of device   Braulio Bosch 11/12/2017, 5:08 PM

## 2017-11-13 ENCOUNTER — Encounter (HOSPITAL_COMMUNITY)
Admission: EM | Disposition: A | Discharge status: Skilled Nursing Facility | Payer: Self-pay | Source: Home / Self Care | Attending: Critical Care Medicine

## 2017-11-13 ENCOUNTER — Inpatient Hospital Stay (HOSPITAL_COMMUNITY): Payer: Medicare Other | Admitting: Anesthesiology

## 2017-11-13 ENCOUNTER — Inpatient Hospital Stay (HOSPITAL_COMMUNITY): Payer: Medicare Other

## 2017-11-13 HISTORY — PX: INTRAMEDULLARY (IM) NAIL INTERTROCHANTERIC: SHX5875

## 2017-11-13 LAB — BASIC METABOLIC PANEL
ANION GAP: 8 (ref 5–15)
BUN: 26 mg/dL — ABNORMAL HIGH (ref 6–20)
CALCIUM: 8.4 mg/dL — AB (ref 8.9–10.3)
CO2: 27 mmol/L (ref 22–32)
Chloride: 102 mmol/L (ref 101–111)
Creatinine, Ser: 0.45 mg/dL (ref 0.44–1.00)
GLUCOSE: 142 mg/dL — AB (ref 65–99)
Potassium: 4.3 mmol/L (ref 3.5–5.1)
SODIUM: 137 mmol/L (ref 135–145)

## 2017-11-13 LAB — CBC
HCT: 23.9 % — ABNORMAL LOW (ref 36.0–46.0)
Hemoglobin: 7.5 g/dL — ABNORMAL LOW (ref 12.0–15.0)
MCH: 29.2 pg (ref 26.0–34.0)
MCHC: 31.4 g/dL (ref 30.0–36.0)
MCV: 93 fL (ref 78.0–100.0)
PLATELETS: 194 10*3/uL (ref 150–400)
RBC: 2.57 MIL/uL — AB (ref 3.87–5.11)
RDW: 14.7 % (ref 11.5–15.5)
WBC: 6 10*3/uL (ref 4.0–10.5)

## 2017-11-13 LAB — CULTURE, RESPIRATORY W GRAM STAIN: Culture: NORMAL

## 2017-11-13 LAB — GLUCOSE, CAPILLARY
GLUCOSE-CAPILLARY: 105 mg/dL — AB (ref 65–99)
GLUCOSE-CAPILLARY: 129 mg/dL — AB (ref 65–99)
GLUCOSE-CAPILLARY: 189 mg/dL — AB (ref 65–99)
Glucose-Capillary: 115 mg/dL — ABNORMAL HIGH (ref 65–99)
Glucose-Capillary: 115 mg/dL — ABNORMAL HIGH (ref 65–99)

## 2017-11-13 LAB — CULTURE, RESPIRATORY

## 2017-11-13 SURGERY — FIXATION, FRACTURE, INTERTROCHANTERIC, WITH INTRAMEDULLARY ROD
Anesthesia: General | Site: Hip | Laterality: Right

## 2017-11-13 MED ORDER — VANCOMYCIN HCL 1000 MG IV SOLR
INTRAVENOUS | Status: DC | PRN
  Administered 2017-11-13: 1000 mg via TOPICAL

## 2017-11-13 MED ORDER — CEFAZOLIN SODIUM-DEXTROSE 2-3 GM-%(50ML) IV SOLR
INTRAVENOUS | Status: DC | PRN
  Administered 2017-11-13: 2 g via INTRAVENOUS

## 2017-11-13 MED ORDER — PROPOFOL 10 MG/ML IV BOLUS
INTRAVENOUS | Status: AC
  Filled 2017-11-13: qty 20

## 2017-11-13 MED ORDER — FENTANYL CITRATE (PF) 100 MCG/2ML IJ SOLN
INTRAMUSCULAR | Status: DC | PRN
  Administered 2017-11-13: 50 ug via INTRAVENOUS
  Administered 2017-11-13 (×2): 100 ug via INTRAVENOUS

## 2017-11-13 MED ORDER — ONDANSETRON HCL 4 MG/2ML IJ SOLN
INTRAMUSCULAR | Status: DC | PRN
  Administered 2017-11-13: 4 mg via INTRAVENOUS

## 2017-11-13 MED ORDER — HYDRALAZINE HCL 20 MG/ML IJ SOLN
10.0000 mg | INTRAMUSCULAR | Status: DC | PRN
  Administered 2017-11-14: 10 mg via INTRAVENOUS
  Filled 2017-11-13: qty 1

## 2017-11-13 MED ORDER — MIDAZOLAM HCL 2 MG/2ML IJ SOLN
INTRAMUSCULAR | Status: AC
  Filled 2017-11-13: qty 2

## 2017-11-13 MED ORDER — ROCURONIUM BROMIDE 100 MG/10ML IV SOLN
INTRAVENOUS | Status: DC | PRN
  Administered 2017-11-13: 50 mg via INTRAVENOUS

## 2017-11-13 MED ORDER — VANCOMYCIN HCL 1000 MG IV SOLR
INTRAVENOUS | Status: AC
  Filled 2017-11-13: qty 1000

## 2017-11-13 MED ORDER — 0.9 % SODIUM CHLORIDE (POUR BTL) OPTIME
TOPICAL | Status: DC | PRN
  Administered 2017-11-13: 1000 mL

## 2017-11-13 MED ORDER — CLONIDINE HCL 0.1 MG PO TABS
0.1000 mg | ORAL_TABLET | Freq: Every day | ORAL | Status: DC
  Administered 2017-11-13: 0.1 mg
  Filled 2017-11-13 (×2): qty 1

## 2017-11-13 MED ORDER — PROPOFOL 10 MG/ML IV BOLUS
INTRAVENOUS | Status: DC | PRN
  Administered 2017-11-13: 70 mg via INTRAVENOUS

## 2017-11-13 MED ORDER — LIDOCAINE HCL (CARDIAC) PF 100 MG/5ML IV SOSY
PREFILLED_SYRINGE | INTRAVENOUS | Status: DC | PRN
  Administered 2017-11-13: 30 mg via INTRAVENOUS

## 2017-11-13 MED ORDER — VALPROIC ACID 250 MG/5ML PO SOLN
250.0000 mg | Freq: Two times a day (BID) | ORAL | Status: DC
  Administered 2017-11-13 – 2017-11-14 (×2): 250 mg
  Filled 2017-11-13 (×2): qty 5

## 2017-11-13 MED ORDER — MIDAZOLAM HCL 5 MG/5ML IJ SOLN
INTRAMUSCULAR | Status: DC | PRN
  Administered 2017-11-13: 2 mg via INTRAVENOUS

## 2017-11-13 MED ORDER — HYDROMORPHONE HCL 1 MG/ML IJ SOLN
1.0000 mg | INTRAMUSCULAR | Status: DC | PRN
  Administered 2017-11-15 (×3): 1 mg via INTRAVENOUS
  Filled 2017-11-13 (×3): qty 1

## 2017-11-13 MED ORDER — FENTANYL CITRATE (PF) 250 MCG/5ML IJ SOLN
INTRAMUSCULAR | Status: AC
  Filled 2017-11-13: qty 5

## 2017-11-13 MED ORDER — TOBRAMYCIN SULFATE 1.2 G IJ SOLR
INTRAMUSCULAR | Status: AC
  Filled 2017-11-13: qty 1.2

## 2017-11-13 MED ORDER — LACTATED RINGERS IV SOLN
INTRAVENOUS | Status: DC | PRN
  Administered 2017-11-13: 18:00:00 via INTRAVENOUS

## 2017-11-13 MED ORDER — CEFAZOLIN SODIUM-DEXTROSE 2-4 GM/100ML-% IV SOLN
2.0000 g | Freq: Three times a day (TID) | INTRAVENOUS | Status: DC
  Administered 2017-11-14: 2 g via INTRAVENOUS
  Filled 2017-11-13 (×2): qty 100

## 2017-11-13 SURGICAL SUPPLY — 44 items
ADH SKN CLS APL DERMABOND .7 (GAUZE/BANDAGES/DRESSINGS) ×1
BIT DRILL FLUTED FEMUR 4.2/3 (BIT) ×2 IMPLANT
BLADE HELICAL TFNA 95 STRL (Anchor) ×1 IMPLANT
BLADE HELICAL TFNA 95MM STRL (Anchor) ×1 IMPLANT
BRUSH SCRUB SURG 4.25 DISP (MISCELLANEOUS) ×6 IMPLANT
CHLORAPREP W/TINT 26ML (MISCELLANEOUS) ×5 IMPLANT
COVER PERINEAL POST (MISCELLANEOUS) ×3 IMPLANT
COVER SURGICAL LIGHT HANDLE (MISCELLANEOUS) ×3 IMPLANT
DERMABOND ADVANCED (GAUZE/BANDAGES/DRESSINGS) ×2
DERMABOND ADVANCED .7 DNX12 (GAUZE/BANDAGES/DRESSINGS) ×1 IMPLANT
DRAPE HALF SHEET 40X57 (DRAPES) ×6 IMPLANT
DRAPE IMP U-DRAPE 54X76 (DRAPES) ×9 IMPLANT
DRAPE INCISE IOBAN 66X45 STRL (DRAPES) ×3 IMPLANT
DRAPE STERI IOBAN 125X83 (DRAPES) ×3 IMPLANT
DRAPE SURG 17X23 STRL (DRAPES) ×6 IMPLANT
DRAPE U-SHAPE 47X51 STRL (DRAPES) ×3 IMPLANT
DRSG MEPILEX BORDER 4X4 (GAUZE/BANDAGES/DRESSINGS) ×5 IMPLANT
DRSG MEPILEX BORDER 4X8 (GAUZE/BANDAGES/DRESSINGS) ×1 IMPLANT
ELECT REM PT RETURN 9FT ADLT (ELECTROSURGICAL) ×3
ELECTRODE REM PT RTRN 9FT ADLT (ELECTROSURGICAL) ×1 IMPLANT
GLOVE BIO SURGEON STRL SZ7.5 (GLOVE) ×12 IMPLANT
GLOVE BIOGEL PI IND STRL 7.5 (GLOVE) ×1 IMPLANT
GLOVE BIOGEL PI INDICATOR 7.5 (GLOVE) ×2
GOWN STRL REUS W/ TWL LRG LVL3 (GOWN DISPOSABLE) ×1 IMPLANT
GOWN STRL REUS W/TWL LRG LVL3 (GOWN DISPOSABLE) ×3
GUIDEWIRE 3.2X400 (WIRE) ×4 IMPLANT
KIT BASIN OR (CUSTOM PROCEDURE TRAY) ×3 IMPLANT
KIT TURNOVER KIT B (KITS) ×3 IMPLANT
LINER BOOT UNIVERSAL DISP (MISCELLANEOUS) ×3 IMPLANT
MANIFOLD NEPTUNE II (INSTRUMENTS) ×3 IMPLANT
NAIL TROCH FIX 10X170 130 (Nail) ×2 IMPLANT
NS IRRIG 1000ML POUR BTL (IV SOLUTION) ×3 IMPLANT
PACK GENERAL/GYN (CUSTOM PROCEDURE TRAY) ×3 IMPLANT
PAD ARMBOARD 7.5X6 YLW CONV (MISCELLANEOUS) ×6 IMPLANT
SCREW LOCKING 5.0MMX40MM (Screw) ×2 IMPLANT
SLEEVE SURGEON STRL (DRAPES) ×2 IMPLANT
SUT ETHILON 3 0 PS 1 (SUTURE) ×2 IMPLANT
SUT MNCRL AB 3-0 PS2 18 (SUTURE) ×3 IMPLANT
SUT VIC AB 0 CT1 27 (SUTURE)
SUT VIC AB 0 CT1 27XBRD ANBCTR (SUTURE) IMPLANT
SUT VIC AB 2-0 CT1 27 (SUTURE) ×9
SUT VIC AB 2-0 CT1 TAPERPNT 27 (SUTURE) ×2 IMPLANT
TOWEL OR 17X26 10 PK STRL BLUE (TOWEL DISPOSABLE) ×6 IMPLANT
WATER STERILE IRR 1000ML POUR (IV SOLUTION) ×3 IMPLANT

## 2017-11-13 NOTE — Anesthesia Preprocedure Evaluation (Signed)
Anesthesia Evaluation  Patient identified by MRN, date of birth, ID band Patient unresponsive    Reviewed: Allergy & Precautions, H&P , Patient's Chart, lab work & pertinent test results, reviewed documented beta blocker date and time   Airway Mallampati: Intubated       Dental no notable dental hx. (+) Teeth Intact, Dental Advisory Given   Pulmonary COPD,  COPD inhaler, Current Smoker,  Chronic respiratory failure with hypoxia   Pulmonary exam normal breath sounds clear to auscultation       Cardiovascular hypertension, Pt. on medications and Pt. on home beta blockers +CHF   Rhythm:Regular Rate:Normal  ECG: NSR, rate 84  ECHO: Normal LV systolic function; mild diastolic dysfunction; elevated LV filling pressure; mild LAE; small pericardial effusion.   Neuro/Psych PSYCHIATRIC DISORDERS Anxiety Depression Bipolar Disorder Schizophrenia negative neurological ROS     GI/Hepatic Neg liver ROS, GERD  Medicated,  Endo/Other  diabetes  Renal/GU negative Renal ROS     Musculoskeletal   Abdominal (+) + obese,   Peds  Hematology  (+) anemia , HLD   Anesthesia Other Findings right hip fracture  Reproductive/Obstetrics                             Anesthesia Physical  Anesthesia Plan  ASA: IV  Anesthesia Plan: General   Post-op Pain Management:    Induction: Intravenous  PONV Risk Score and Plan: 2 and Ondansetron and Treatment may vary due to age or medical condition  Airway Management Planned: Oral ETT  Additional Equipment:   Intra-op Plan:   Post-operative Plan: Post-operative intubation/ventilation  Informed Consent: I have reviewed the patients History and Physical, chart, labs and discussed the procedure including the risks, benefits and alternatives for the proposed anesthesia with the patient or authorized representative who has indicated his/her understanding and acceptance.    Dental advisory given  Plan Discussed with: CRNA  Anesthesia Plan Comments:         Anesthesia Quick Evaluation

## 2017-11-13 NOTE — Progress Notes (Signed)
PULMONARY / CRITICAL CARE MEDICINE   Name: Natalie Thomas MRN: [id number] DOB: 14-Sep-1960    ADMISSION DATE:  11/07/2017 CONSULTATION DATE:  11/08/2017  REFERRING MD:  TRH  CHIEF COMPLAINT:   Acute hypoxic respiratory failure.  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  Failed BiPAP and required intubation 5/18. Fall at home and suffered intratrochanteric R hip fracture.     SUBJECTIVE:  No acute events.  Remains intubated.  Awaiting further recs by ortho regarding surgical fixation of right hip fx.  VITAL SIGNS: BP (!) 165/65   Pulse 60   Temp 99.1 F (37.3 C) (Oral)   Resp 20   Ht 5\' 4"  (1.626 m)   Wt 99.4 kg (219 lb 2.2 oz)   SpO2 97%   BMI 37.61 kg/m   HEMODYNAMICS:  No vasopressors.  VENTILATOR SETTINGS: Vent Mode: PRVC FiO2 (%):  [10 %-40 %] 40 % Set Rate:  [20 bmp] 20 bmp Vt Set:  [440 mL] 440 mL PEEP:  [5 XIP38-25 cmH20] 5 cmH20 Plateau Pressure:  [14 cmH20-24 cmH20] 15 cmH20  INTAKE / OUTPUT: I/O last 3 completed shifts: In: 3586.8 [I.V.:1506.8; NG/GT:1080; IV Piggyback:1000] Out: 1815 [Urine:1815]  PHYSICAL EXAMINATION: General:  Well nourished but looks older than stated age, in NAD Neuro:  Sedated on propofol. Currently RASS-3  HEENT:  Mucus membrane. ETT and OGT in place Cardiovascular:  RRR, no M/R/G  Lungs:  Equal and non-labored.  CTAB Abdomen:  BS + soft no masses Musculoskeletal:  No edema. R leg in 5lbs bucks traction Skin:  No bruising  LABS:  BMET Recent Labs  Lab 11/11/17 1247 11/12/17 0433 11/13/17 0253  NA 133* 136 137  K 4.7 4.4 4.3  CL 96* 99* 102  CO2 29 30 27   BUN 33* 31* 26*  CREATININE 0.66 0.53 0.45  GLUCOSE 118* 130* 142*    Electrolytes Recent Labs  Lab 11/09/17 0335  11/10/17 0101  11/11/17 0446 11/11/17 1247 11/12/17 0433 11/13/17 0253  CALCIUM 8.2*   < > 8.4*   <  > 8.4* 8.4* 8.4* 8.4*  MG 1.4*  --  1.9  --  1.9  --   --   --   PHOS 3.8  --  4.1  --  4.1  --   --   --    < > = values in this interval not displayed.    CBC Recent Labs  Lab 11/11/17 0446 11/12/17 0433 11/13/17 0253  WBC 8.0 7.8 6.0  HGB 8.4* 8.0* 7.5*  HCT 25.9* 25.5* 23.9*  PLT 185 214 194    Coag's Recent Labs  Lab 11/07/17 0550  INR 1.02    Sepsis Markers Recent Labs  Lab 11/08/17 2257 11/09/17 0335 11/10/17 0101 11/10/17 2243  LATICACIDVEN 0.8  --   --  0.9  PROCALCITON <0.10 0.16 0.19  --     ABG Recent Labs  Lab 11/11/17 0116 11/11/17 0455 11/12/17 0305  PHART 7.327* 7.412 7.394  PCO2ART 58.6* 46.9 48.4*  PO2ART 186.0* 76.0* 74.7*    Liver Enzymes No results for input(s): AST, ALT, ALKPHOS, BILITOT, ALBUMIN in the last 168 hours.  Cardiac Enzymes Recent Labs  Lab 11/07/17 0850  TROPONINI 0.03*    Glucose Recent Labs  Lab 11/12/17 1218 11/12/17 1533 11/12/17 2026 11/12/17 2317 11/13/17 0335 11/13/17 0802  GLUCAP 128* 120*  112* 126* 129* 105*    Imaging No results found.   STUDIES:  N/A  CULTURES: N/A  ANTIBIOTICS: Azetreonam 5/18 >  Vancomycin 5/18 >   SIGNIFICANT EVENTS: Required bronchoscopy 5/20.  ORIF right hip has been postponed.  LINES/TUBES: ETT, OGT, arterial line. PIV's  DISCUSSION: Encephalopathy and respiratory failure due to suspected LLL pneumonia with subsequent hip fracture from fall.   ASSESSMENT / PLAN:  PULMONARY A: Acute respiratory failure with poor secretion clearance due to HCAP.  P:   Continue vent support No extubation plans given needs for OR Continue pulmonary toilet Follow CXR  CARDIOVASCULAR A:  Hx HTN P:  Resume preadmission clonidine to prevent rebound, but at lower dose PRN hydralazine Continue preadmission ASA Hold preadmission carvedilol, hydralazine, nifedepine, pravastatin  RENAL A:   Hyponatremia likely hypovolemic - resolved P:   Continue gentle  hydration Follow BMP  GASTROINTESTINAL A:   Nutrition GI prophylaxis P:   Continue tube feeds  PPI  HEMATOLOGIC A:   Anemia of chronic disease +/- acute blood loss from hip fracture DVT prophylaxis P:  Transfuse for Hgb < 7 Lovenox / SCD's  INFECTIOUS A:   Radiologic pneumonia with GPC's and PMNs on tracheal aspirate. P:   Continue antibiotics for 7 days (stop date 5/24)  ENDOCRINE A:   Euglycemic on SSI   P:   Continue current regimen  NEUROLOGIC A:   Sedation needs due to mechanical ventilation - has been oversedated though P:   RASS goal: 0 Sedation: Propofol gtt - wean as able Pain: Continue scheduled and intermittent hydromorphone Continue preadmission depakote, alprazolam, latuda, paroxetine, trazodone Hold preadmission bupropion, hydroxyzine, oxycodone  MUSCULOSKELETAL A: Right hip fx P: Per ortho - will ask them to re-eval today 5/22  FAMILY  - Updates: has a legal guardian, but updates recently.  - Inter-disciplinary family meet or Palliative Care meeting due by:  day 7  CC time: 35 min.   Montey Hora, [facility] Pager: [phone]  or [phone] 11/13/2017, 10:17 AM

## 2017-11-13 NOTE — Transfer of Care (Signed)
Immediate Anesthesia Transfer of Care Note  Patient: Natalie Thomas  Procedure(s) Performed: INTRAMEDULLARY (IM) NAIL INTERTROCHANTRIC (Right Hip)  Patient Location: ICU  Anesthesia Type:General  Level of Consciousness: sedated and unresponsive  Airway & Oxygen Therapy: Patient remains intubated per anesthesia plan and Patient placed on Ventilator (see vital sign flow sheet for setting)  Post-op Assessment: Report given to RN and Post -op Vital signs reviewed and stable  Post vital signs: Reviewed and stable  Last Vitals:  Vitals Value Taken Time  BP 191/68 11/13/2017  8:17 PM  Temp    Pulse 69 11/13/2017  8:21 PM  Resp 22 11/13/2017  8:21 PM  SpO2 100 % 11/13/2017  8:21 PM  Vitals shown include unvalidated device data.  Last Pain:  Vitals:   11/13/17 1601  TempSrc: Oral  PainSc:       Patients Stated Pain Goal: 6 (10/62/69 4854)  Complications: No apparent anesthesia complications

## 2017-11-13 NOTE — Progress Notes (Signed)
CSW spoke with Ardis Hughs from [facility] [phone] to give update on pt.    Natalie Thomas, MSW, [facility] Clinical Social Worker [phone]

## 2017-11-13 NOTE — Op Note (Signed)
OrthopaedicSurgeryOperativeNote [phone]) Date of Surgery: 11/13/2017  Admit Date: 11/07/2017   Diagnoses: Pre-Op Diagnoses: Right intertrochanteric femur fracture  Post-Op Diagnosis: Same  Procedures: CPT 27245-Cephalomedullary nailing of right intertrochanteric femur fracture  Surgeons: Primary: Shona Needles, MD   Location:[facility] OR ROOM 07   AnesthesiaGeneral   Antibiotics:Ancef 2g preop   Tourniquettime:* No tourniquets in log * .  INOMVEHMCNOBSJGGEZ:66 mL   Complications:None  Specimens:None  Implants: Implant Name Type Inv. Item Serial No. Manufacturer Lot No. LRB No. Used Action  NAIL TROCH FIX [id number] 130 - [id number] Nail NAIL TROCH FIX [id number] 130  [employer] [id number] Right 1 Implanted  BLADE HELICAL TFNA 81EX STRL - [id number] Anchor BLADE HELICAL TFNA 44HQ STRL  [employer] [id number] Right 1 Implanted  SCREW LOCKING 5.0MMX40MM - [id number] Screw SCREW LOCKING 5.0MMX40MM  [employer] [id number] Right 1 Implanted    IndicationsforSurgery: This is a 57 year old female with a significant history for CHF, COPD, depression, type 2 diabetes who lives at assisted living facility.  She sustained a ground-level fall and was found to have a right displaced intertrochanteric femur fracture.  She presents emergency room where she was found to be severely hyponatremic.  Correction was instituted.  However she became confused following this.  She then went into respiratory failure.  She was intubated.  She was then stabilized by the critical care service and eventually was found fit for proceeding with surgical fixation of her right intertrochanteric femur fracture.  Due to her previous ambulatory capabilities as well as significant pain that she was then it was felt that proceeding with surgical fixation would be most appropriate of an palliative for the patient.  Risks were discussed with the patient prior to her confusion state however consent was never  obtained for her surgical fixation.  Unfortunately she does not have any family and Education officer, museum for the patient has not been able to be signed the consent form.  As result the fixation was deemed emergency due to her relatively good pulmonary status and the need for allowing for extubation.  Operative Findings: Closed reduction and cephalo-medullary nailing of right intertrochanteric femur fracture using [employer] short TFN size 10 mm with a 95 mm helical blade  Procedure: The patient was identified in the ICU and emergent consent was obtained. The operative extremity was marked and the patient was then brought back to the operating room by our anesthesia colleagues. The patient was placed under general anesthesia and then carefully transferred over to a [place]. The feet were secured into a traction boot and well padded. A post was placed in the groin and traction was pulled on the operative leg. The contralateral leg was positioned out of the way of fluoroscopy and secure . Fluoroscopic images were obtained and traction and manipulation was performed to reduce the fracture. Once adequate reduction was performed then the operative extremity was prepped and draped in sterile fashion. Preincision timeout was performed to verify the patient, the procedure and the extremity. Preoperative antibiotics were dosed.  A small incision was made proximal to the greater trochanter. A curved Mayo scissors was used to spread down to the greater trochanter in line with the abductor musculature. A threaded guidepin was positioned at an appropriate starting point on the AP and lateral views. It was advanced in the femur past the lesser trochanter. A entry reamer with soft tissue protector was then used to enter the canal. A radiographic ruler was used to judge the size of the canal of the femur and  a 72mm short nail was placed into the canal and seated down to an appropriate position radiographically. The targeting arm for the  helical blade was attached. A percutaneous incision was made for the guide for the helical blade. A threaded guidepin was placed into the femoral neck and head and fluoroscopy was used to confirm adequate placement with an acceptable tip-apex distance. A drill was used to perforate the lateral cortex and 16XW helical blade was inserted into the head/neck segment. The set screw was then tightened to set rotation and backed off to allow compression. The aiming arm was removed from the nail and position of the helical blade was confirmed with fluoroscopy. Using the targeting arm a distal interlock was placed bicortically in the shaft.  Final fluoroscopic images were obtained and the incisions were copiously irrigated. The skin was closed with 2-0 vicryl and 3-0 nylon. The incisions were dressing with Mepilex dressings. The patient was carefully transferred to the regular floor bed and was taken to PACU in stable condition.  Post Op Plan/Instructions: Weightbearing as tolerated to right leg. Postoperative ancef. I would recommend lovenox for DVT prophylaxis for the patient for 30 days.  I was present and performed the entire surgery.  Katha Hamming, MD [facility]

## 2017-11-13 NOTE — Plan of Care (Signed)
  Problem: Health Behavior/Discharge Planning: Goal: Ability to manage health-related needs will improve Outcome: Progressing   Problem: Clinical Measurements: Goal: Ability to maintain clinical measurements within normal limits will improve Outcome: Progressing Goal: Will remain free from infection Outcome: Progressing Goal: Diagnostic test results will improve Outcome: Progressing Goal: Respiratory complications will improve Outcome: Progressing Goal: Cardiovascular complication will be avoided Outcome: Progressing   Problem: Activity: Goal: Risk for activity intolerance will decrease Outcome: Progressing   Problem: Nutrition: Goal: Adequate nutrition will be maintained Outcome: Progressing   Problem: Coping: Goal: Level of anxiety will decrease Outcome: Progressing   Problem: Elimination: Goal: Will not experience complications related to bowel motility Outcome: Progressing Goal: Will not experience complications related to urinary retention Outcome: Progressing   Problem: Pain Managment: Goal: General experience of comfort will improve Outcome: Progressing   Problem: Safety: Goal: Ability to remain free from injury will improve Outcome: Progressing   Problem: Skin Integrity: Goal: Risk for impaired skin integrity will decrease Outcome: Progressing   Problem: Education: Goal: Ability to demonstrate management of disease process will improve Outcome: Progressing Goal: Ability to verbalize understanding of medication therapies will improve Outcome: Progressing   Problem: Activity: Goal: Capacity to carry out activities will improve Outcome: Progressing   Problem: Cardiac: Goal: Ability to achieve and maintain adequate cardiopulmonary perfusion will improve Outcome: Progressing   Problem: Activity: Goal: Ability to tolerate increased activity will improve Outcome: Progressing   Problem: Respiratory: Goal: Ability to maintain a clear airway and adequate  ventilation will improve Outcome: Progressing   Problem: Role Relationship: Goal: Method of communication will improve Outcome: Progressing

## 2017-11-13 NOTE — Anesthesia Procedure Notes (Signed)
Date/Time: 11/13/2017 7:05 PM Performed by: Eligha Bridegroom, CRNA Pre-anesthesia Checklist: Patient identified, Emergency Drugs available, Suction available, Patient being monitored and Timeout performed Patient Re-evaluated:Patient Re-evaluated prior to induction Oxygen Delivery Method: Circle system utilized Preoxygenation: Pre-oxygenation with 100% oxygen Induction Type: IV induction Placement Confirmation: breath sounds checked- equal and bilateral and positive ETCO2

## 2017-11-14 ENCOUNTER — Inpatient Hospital Stay (HOSPITAL_COMMUNITY): Payer: Medicare Other

## 2017-11-14 ENCOUNTER — Encounter (HOSPITAL_COMMUNITY): Payer: Self-pay | Admitting: Student

## 2017-11-14 LAB — BASIC METABOLIC PANEL
ANION GAP: 8 (ref 5–15)
BUN: 25 mg/dL — AB (ref 6–20)
CHLORIDE: 100 mmol/L — AB (ref 101–111)
CO2: 28 mmol/L (ref 22–32)
Calcium: 8.7 mg/dL — ABNORMAL LOW (ref 8.9–10.3)
Creatinine, Ser: 0.51 mg/dL (ref 0.44–1.00)
GFR calc Af Amer: >60 mL/min (ref >60)
GLUCOSE: 239 mg/dL — AB (ref 65–99)
POTASSIUM: 4.6 mmol/L (ref 3.5–5.1)
Sodium: 136 mmol/L (ref 135–145)

## 2017-11-14 LAB — GLUCOSE, CAPILLARY
GLUCOSE-CAPILLARY: 133 mg/dL — AB (ref 65–99)
GLUCOSE-CAPILLARY: 135 mg/dL — AB (ref 65–99)
Glucose-Capillary: 123 mg/dL — ABNORMAL HIGH (ref 65–99)
Glucose-Capillary: 157 mg/dL — ABNORMAL HIGH (ref 65–99)
Glucose-Capillary: 214 mg/dL — ABNORMAL HIGH (ref 65–99)
Glucose-Capillary: 218 mg/dL — ABNORMAL HIGH (ref 65–99)

## 2017-11-14 LAB — CBC
HEMATOCRIT: 25.3 % — AB (ref 36.0–46.0)
HEMOGLOBIN: 7.9 g/dL — AB (ref 12.0–15.0)
MCH: 28.8 pg (ref 26.0–34.0)
MCHC: 31.2 g/dL (ref 30.0–36.0)
MCV: 92.3 fL (ref 78.0–100.0)
Platelets: 225 10*3/uL (ref 150–400)
RBC: 2.74 MIL/uL — ABNORMAL LOW (ref 3.87–5.11)
RDW: 14.6 % (ref 11.5–15.5)
WBC: 8.4 10*3/uL (ref 4.0–10.5)

## 2017-11-14 LAB — MAGNESIUM: Magnesium: 1.8 mg/dL (ref 1.7–2.4)

## 2017-11-14 LAB — PHOSPHORUS: PHOSPHORUS: 5.1 mg/dL — AB (ref 2.5–4.6)

## 2017-11-14 LAB — TRIGLYCERIDES: TRIGLYCERIDES: 153 mg/dL — AB (ref <150)

## 2017-11-14 MED ORDER — PANTOPRAZOLE SODIUM 40 MG PO TBEC
40.0000 mg | DELAYED_RELEASE_TABLET | Freq: Every day | ORAL | Status: DC
  Administered 2017-11-14 – 2017-11-20 (×7): 40 mg via ORAL
  Filled 2017-11-14 (×7): qty 1

## 2017-11-14 MED ORDER — ALPRAZOLAM 0.5 MG PO TABS
0.5000 mg | ORAL_TABLET | Freq: Three times a day (TID) | ORAL | Status: DC
  Administered 2017-11-14 – 2017-11-20 (×19): 0.5 mg via ORAL
  Filled 2017-11-14 (×19): qty 1

## 2017-11-14 MED ORDER — HALOPERIDOL LACTATE 5 MG/ML IJ SOLN: 5.0000 mg | Freq: Four times a day (QID) | INTRAMUSCULAR | Status: DC | PRN

## 2017-11-14 MED ORDER — PANTOPRAZOLE SODIUM 40 MG PO PACK: 40.0000 mg | PACK | Freq: Every day | ORAL | Status: DC

## 2017-11-14 MED ORDER — ORAL CARE MOUTH RINSE
15.0000 mL | Freq: Two times a day (BID) | OROMUCOSAL | Status: DC
  Administered 2017-11-14 – 2017-11-15 (×2): 15 mL via OROMUCOSAL

## 2017-11-14 MED ORDER — CLONIDINE HCL 0.1 MG PO TABS
0.1000 mg | ORAL_TABLET | Freq: Every day | ORAL | Status: DC
  Administered 2017-11-14 – 2017-11-20 (×7): 0.1 mg via ORAL
  Filled 2017-11-14 (×7): qty 1

## 2017-11-14 MED ORDER — POLYETHYLENE GLYCOL 3350 17 G PO PACK
17.0000 g | PACK | Freq: Two times a day (BID) | ORAL | Status: DC | PRN
  Filled 2017-11-14: qty 1

## 2017-11-14 MED ORDER — SENNOSIDES 8.8 MG/5ML PO SYRP
5.0000 mL | ORAL_SOLUTION | Freq: Every day | ORAL | Status: DC
  Administered 2017-11-14 – 2017-11-18 (×3): 5 mL via ORAL
  Filled 2017-11-14 (×7): qty 5

## 2017-11-14 MED ORDER — DOCUSATE SODIUM 50 MG/5ML PO LIQD
100.0000 mg | Freq: Two times a day (BID) | ORAL | Status: DC
  Administered 2017-11-14 – 2017-11-20 (×9): 100 mg via ORAL
  Filled 2017-11-14 (×15): qty 10

## 2017-11-14 MED ORDER — VALPROIC ACID 250 MG/5ML PO SOLN
250.0000 mg | Freq: Two times a day (BID) | ORAL | Status: DC
  Administered 2017-11-14 – 2017-11-15 (×2): 250 mg via ORAL
  Filled 2017-11-14 (×5): qty 5

## 2017-11-14 MED ORDER — IPRATROPIUM-ALBUTEROL 0.5-2.5 (3) MG/3ML IN SOLN
3.0000 mL | Freq: Three times a day (TID) | RESPIRATORY_TRACT | Status: DC
  Administered 2017-11-14 – 2017-11-19 (×15): 3 mL via RESPIRATORY_TRACT
  Filled 2017-11-14 (×16): qty 3

## 2017-11-14 MED ORDER — CLONIDINE HCL 0.1 MG PO TABS: 0.1000 mg | ORAL_TABLET | Freq: Every day | ORAL | Status: DC

## 2017-11-14 NOTE — Procedures (Signed)
Extubation Procedure Note  Patient Details:   Name: Natalie Thomas DOB: [birth year] MRN: [id number]   Airway Documentation:   Patient extubated per order. Patient had positive cuff leak prior to extubation. Placed on 4 lpm humidified oxygen. Patient has strong cough and able to voice. No stridor nor dyspnea noted. Vent end date: 11/14/17 Vent end time: 1035   Evaluation  O2 sats: stable throughout Complications: No apparent complications Patient did tolerate procedure well. Bilateral Breath Sounds: Clear, Diminished   Yes  Ander Purpura 11/14/2017, 10:38 AM

## 2017-11-14 NOTE — Plan of Care (Signed)
  Problem: Health Behavior/Discharge Planning: Goal: Ability to manage health-related needs will improve Outcome: Progressing   Problem: Clinical Measurements: Goal: Ability to maintain clinical measurements within normal limits will improve Outcome: Progressing Goal: Will remain free from infection Outcome: Progressing Goal: Diagnostic test results will improve Outcome: Progressing Goal: Respiratory complications will improve Outcome: Progressing Goal: Cardiovascular complication will be avoided Outcome: Progressing   Problem: Activity: Goal: Risk for activity intolerance will decrease Outcome: Progressing   Problem: Nutrition: Goal: Adequate nutrition will be maintained Outcome: Progressing   Problem: Coping: Goal: Level of anxiety will decrease Outcome: Progressing   Problem: Elimination: Goal: Will not experience complications related to bowel motility Outcome: Progressing Goal: Will not experience complications related to urinary retention Outcome: Progressing   Problem: Pain Managment: Goal: General experience of comfort will improve Outcome: Progressing   Problem: Safety: Goal: Ability to remain free from injury will improve Outcome: Progressing   Problem: Skin Integrity: Goal: Risk for impaired skin integrity will decrease Outcome: Progressing   Problem: Education: Goal: Ability to demonstrate management of disease process will improve Outcome: Progressing Goal: Ability to verbalize understanding of medication therapies will improve Outcome: Progressing   Problem: Activity: Goal: Capacity to carry out activities will improve Outcome: Progressing   Problem: Cardiac: Goal: Ability to achieve and maintain adequate cardiopulmonary perfusion will improve Outcome: Progressing   Problem: Activity: Goal: Ability to tolerate increased activity will improve Outcome: Completed/Met   Problem: Respiratory: Goal: Ability to maintain a clear airway and  adequate ventilation will improve Outcome: Completed/Met   Problem: Role Relationship: Goal: Method of communication will improve Outcome: Completed/Met

## 2017-11-14 NOTE — Progress Notes (Signed)
Orthopaedic Trauma Progress Note  S: Intubated  O: Open eyes this AM but no other interaction. Does not follow commands  Imaging: Stable post op imaging  Labs:  CBC    Component Value Date/Time   WBC 8.4 11/14/2017 0355   RBC 2.74 (L) 11/14/2017 0355   HGB 7.9 (L) 11/14/2017 0355   HGB 14.4 06/22/2012 0014   HCT 25.3 (L) 11/14/2017 0355   HCT 43.6 06/22/2012 0014   PLT 225 11/14/2017 0355   PLT 189 06/22/2012 0014   MCV 92.3 11/14/2017 0355   MCV 89 06/22/2012 0014   MCH 28.8 11/14/2017 0355   MCHC 31.2 11/14/2017 0355   RDW 14.6 11/14/2017 0355   RDW 17.4 (H) 06/22/2012 0014   LYMPHSABS 1.2 11/07/2017 0550   LYMPHSABS 0.6 (L) 06/01/2012 0458   MONOABS 0.8 11/07/2017 0550   MONOABS 0.2 06/01/2012 0458   EOSABS 0.0 11/07/2017 0550   EOSABS 0.0 06/01/2012 0458   BASOSABS 0.0 11/07/2017 0550   BASOSABS 0.0 06/01/2012 04566    A/P: 57 year old female s/p cephalomedullary nailing of right intertroch femur fracture  Weightbearing: WBAT RLE Insicional and dressing care: OK to remove dressings POD3 and leave open to air with dry gauze PRN Orthopedic device(s): None Showering: Okay to shower once dressings are removed VTE prophylaxis: Lovenox 40mg  qd 30 days Pain control: Dilaudid Follow - up plan: 2 weeks   Shona Needles, MD [facility] [phone] (phone)

## 2017-11-14 NOTE — Anesthesia Postprocedure Evaluation (Signed)
Anesthesia Post Note  Patient: Natalie Thomas  Procedure(s) Performed: INTRAMEDULLARY (IM) NAIL INTERTROCHANTRIC (Right Hip)     Patient location during evaluation: SICU Anesthesia Type: General Level of consciousness: sedated Pain management: pain level controlled Vital Signs Assessment: post-procedure vital signs reviewed and stable Respiratory status: patient remains intubated per anesthesia plan Cardiovascular status: stable Postop Assessment: no apparent nausea or vomiting Anesthetic complications: no    Last Vitals:  Vitals:   11/14/17 0825 11/14/17 0900  BP: (!) 166/80 (!) 153/83  Pulse: (!) 102 (!) 105  Resp: (!) 24 15  Temp:    SpO2: 98% 94%    Last Pain:  Vitals:   11/14/17 0713  TempSrc: Oral  PainSc:                  Karyl Kinnier Caydan Mctavish

## 2017-11-14 NOTE — Progress Notes (Signed)
PULMONARY / CRITICAL CARE MEDICINE   Name: Natalie Thomas MRN: [id number] DOB: [birth year]    ADMISSION DATE:  11/07/2017 CONSULTATION DATE:  11/07/2017  REFERRING MD:  TRH  CHIEF COMPLAINT:  Acute hypoxic respiratory failure.  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 Required intubation. Fractured right hip with [facility]current fall at [facility].   Baseline COPD/CHF on 2L/min.  PAST MEDICAL HISTORY :  She  has a past medical history of Acute on chronic respiratory failure with hypoxia and hypercapnia (HCC) (10/07/2017), Anemia (10/16/2017), Anxiety and depression, CHF (congestive heart failure) ([place]), Chronic hyponatremia (04/08/2007), Chronic respiratory failure with hypoxia ([place]) (10/07/2017), Closed comminuted intertrochanteric fracture of right femur ([place]) (11/07/2017), COPD (04/08/2007), Cough (05/08/2011), Depression, Diabetes mellitus, type 2 ([place]), Dyspnea (10/16/2017), Dysuria (04/08/2007), Essential hypertension (04/08/2007), GERD (gastroesophageal reflux disease), HIP PAIN, LEFT (12/12/2007), Hyperlipidemia (10/07/2017), Hypertension, Hyponatremia (07/27/2011), Incontinence of urine (08/14/2011), Lumbar disc disease (04/09/2011), MENOPAUSE, PREMATURE (04/08/2007), NEOPLASM, MALIGNANT, VULVA (04/08/2007), Osteoporosis, Osteoporosis (04/08/2007), Polycythemia secondary to smoking (04/09/2011), S/P right hip fracture (11/07/2017), and Schizoaffective disorder, bipolar type ([place]) (07/05/2011).  PAST SURGICAL HISTORY: She  has a past surgical history that includes Pelvic fracture surgery and Eye surgery.  Allergies  Allergen Reactions  . Clarithromycin Itching  . Penicillins Itching    Has patient had a PCN reaction causing immediate rash, facial/tongue/throat swelling, SOB or lightheadedness with hypotension: No Has patient had a PCN reaction causing severe rash involving mucus membranes or skin necrosis: No Has patient had a PCN reaction  that required hospitalization: Unknown Has patient had a PCN reaction occurring within the last 10 years: No If all of the above answers are "NO", then may proceed with Cephalosporin use.     No current facility-administered medications on file prior to encounter.    Current Outpatient Medications on File Prior to Encounter  Medication Sig  . acetaminophen (TYLENOL) 325 MG tablet Take 650 mg by mouth every 6 (six) hours as needed for mild pain.  Marland Kitchen albuterol (PROVENTIL HFA;VENTOLIN HFA) 108 (90 Base) MCG/ACT inhaler Inhale 2 puffs into the lungs every 4 (four) hours as needed for wheezing or shortness of breath.  . ALPRAZolam (XANAX) 0.5 MG tablet Take 1 tablet (0.5 mg total) by mouth 3 (three) times daily.  Marland Kitchen alum & mag hydroxide-simeth (MINTOX) 160-109-32 MG/5ML suspension Take 30 mLs by mouth every 6 (six) hours as needed for indigestion or heartburn.  Marland Kitchen aspirin 81 MG tablet Take 1 tablet (81 mg total) by mouth daily. For heart health  . buPROPion (WELLBUTRIN XL) 300 MG 24 hr tablet Take 1 tablet (300 mg total) by mouth every morning. (Patient taking differently: Take 300 mg by mouth daily. )  . carvedilol (COREG) 25 MG tablet Take 25 mg by mouth 2 (two) times daily with a meal.  . cloNIDine (CATAPRES - DOSED IN MG/24 HR) 0.3 mg/24hr patch Place 0.3 mg onto the skin once a week. On Monday  . dextromethorphan-guaiFENesin (MUCINEX DM) 30-600 MG 12hr tablet Take 1 tablet by mouth every 12 (twelve) hours as needed for cough.  . divalproex (DEPAKOTE) 250 MG DR tablet Take 1 tablet (250 mg total) by mouth every 12 (twelve) hours.  . ferrous sulfate 325 (65 FE) MG tablet Take 325 mg by mouth daily with breakfast.  . fluticasone (FLONASE) 50 MCG/ACT nasal spray Place 2 sprays into both nostrils 2 (two) times daily.  . fluticasone furoate-vilanterol (BREO ELLIPTA) 100-25 MCG/INH AEPB Inhale 1 puff into the lungs  daily.  . guaifenesin (ROBITUSSIN) 100 MG/5ML syrup Take 200 mg by mouth 3 (three) times  daily as needed for cough.  . hydrALAZINE (APRESOLINE) 100 MG tablet Take 100 mg by mouth every 8 (eight) hours.  . hydrOXYzine (ATARAX/VISTARIL) 25 MG tablet Take 1 tablet (25 mg total) by mouth 3 (three) times daily as needed for anxiety.  Marland Kitchen ipratropium-albuterol (DUONEB) 0.5-2.5 (3) MG/3ML SOLN Take 3 mLs by nebulization 3 (three) times daily as needed (SOb).  . lurasidone (LATUDA) 80 MG TABS tablet Take 1 tablet (80 mg total) by mouth daily with breakfast. (Patient taking differently: Take 40 mg by mouth daily with breakfast. )  . magnesium hydroxide (MILK OF MAGNESIA) 400 MG/5ML suspension Take 30 mLs by mouth at bedtime as needed for mild constipation.  . magnesium oxide (MAG-OX) 400 (241.3 Mg) MG tablet Take 2 tablets (800 mg total) by mouth 2 (two) times daily.  . Melatonin 10 MG TABS Take 10 mg by mouth at bedtime.  . Menthol, Topical Analgesic, (BIOFREEZE COLORLESS EX) Apply 1 application topically 3 (three) times daily. TO UPPER ABDOMEN  . Methylcellulose, Laxative, 500 MG TABS Take 500 mg by mouth at bedtime.  . Multiple Vitamin (DAILY-VITE PO) Take 1 tablet by mouth daily.  Marland Kitchen neomycin-bacitracin-polymyxin (NEOSPORIN) OINT Apply 1 application topically daily.   Marland Kitchen NIFEdipine (PROCARDIA XL/ADALAT-CC) 90 MG 24 hr tablet Take 90 mg by mouth daily.  Marland Kitchen omeprazole (PRILOSEC) 20 MG capsule Take 1 capsule (20 mg total) by mouth 2 (two) times daily before a meal. For acid reflux.  Marland Kitchen oxyCODONE (OXY IR/ROXICODONE) 5 MG immediate release tablet Take 1 tablet (5 mg total) by mouth every 6 (six) hours as needed for severe pain.  Marland Kitchen PARoxetine (PAXIL) 20 MG tablet Take 1 tablet (20 mg total) by mouth daily.  . polyethylene glycol (MIRALAX) packet Take 17 g by mouth daily as needed.  . pravastatin (PRAVACHOL) 20 MG tablet Take 20 mg by mouth daily.  . sodium chloride 1 g tablet Take 1 g by mouth 3 (three) times daily with meals.   . sucralfate (CARAFATE) 1 g tablet Take 1 g by mouth 4 (four) times  daily.  . traZODone (DESYREL) 50 MG tablet Take 50 mg by mouth at bedtime.  . dicyclomine (BENTYL) 20 MG tablet Take 1 tablet (20 mg total) by mouth 2 (two) times daily.    FAMILY HISTORY:  Her has no family status information on file.    SOCIAL HISTORY: She  reports that she has been smoking cigarettes.  She has been smoking about 0.25 packs per day. She has never used smokeless tobacco. She reports that she does not drink alcohol or use drugs.  REVIEW OF SYSTEMS:   Unable to elicit due to mental status.  SUBJECTIVE:  Remains sedated following ORIF yesterday. Periods of apnea with SBT  VITAL SIGNS: BP (!) 166/80   Pulse (!) 102   Temp 98.2 F (36.8 C) (Oral)   Resp (!) 24   Ht 5\' 4"  (1.626 m)   Wt 210 lb 8.6 oz (95.5 kg)   SpO2 98%   BMI 36.14 kg/m   HEMODYNAMICS:  On no vasopressors.  VENTILATOR SETTINGS: Vent Mode: PRVC FiO2 (%):  [40 %] 40 % Set Rate:  [20 bmp] 20 bmp Vt Set:  [440 mL] 440 mL PEEP:  [5 cmH20] 5 cmH20 Pressure Support:  [10 cmH20] 10 cmH20 Plateau Pressure:  [15 cmH20-17 cmH20] 16 cmH20  INTAKE / OUTPUT: I/O last 3 completed shifts: In:  3692.6 [I.V.:1791.6; NG/GT:1001; IV Piggyback:900] Out: 2952 [Urine:1720; Blood:50]  PHYSICAL EXAMINATION: General:  Looks older than stated age. Neuro:  Sedated with propofol and ATC dilaudid.  Opens eyes weakly to voice. Not following commands.  HEENT:  ETT in place. Cardiovascular:  HS normal, extremities warm.  Lungs:  Clear bilaterally.  Minimal secretions.  Apnea on SBT but adequate Vt. Abdomen:  Soft, tolerating tube feeds, no stool since 5/19. Musculoskeletal:  R hip  Incision intact. Skin:  Line sites intact.  LABS:  BMET Recent Labs  Lab 11/12/17 0433 11/13/17 0253 11/14/17 0355  NA 136 137 136  K 4.4 4.3 4.6  CL 99* 102 100*  CO2 30 27 28   BUN 31* 26* 25*  CREATININE 0.53 0.45 0.51  GLUCOSE 130* 142* 239*    Electrolytes Recent Labs  Lab 11/10/17 0101  11/11/17 0446   11/12/17 0433 11/13/17 0253 11/14/17 0355  CALCIUM 8.4*   < > 8.4*   < > 8.4* 8.4* 8.7*  MG 1.9  --  1.9  --   --   --  1.8  PHOS 4.1  --  4.1  --   --   --  5.1*   < > = values in this interval not displayed.    CBC Recent Labs  Lab 11/12/17 0433 11/13/17 0253 11/14/17 0355  WBC 7.8 6.0 8.4  HGB 8.0* 7.5* 7.9*  HCT 25.5* 23.9* 25.3*  PLT 214 194 225    Coag's No results for input(s): APTT, INR in the last 168 hours.  Sepsis Markers Recent Labs  Lab 11/08/17 2257 11/09/17 0335 11/10/17 0101 11/10/17 2243  LATICACIDVEN 0.8  --   --  0.9  PROCALCITON <0.10 0.16 0.19  --     ABG Recent Labs  Lab 11/11/17 0116 11/11/17 0455 11/12/17 0305  PHART 7.327* 7.412 7.394  PCO2ART 58.6* 46.9 48.4*  PO2ART 186.0* 76.0* 74.7*    Liver Enzymes No results for input(s): AST, ALT, ALKPHOS, BILITOT, ALBUMIN in the last 168 hours.  Cardiac Enzymes Recent Labs  Lab 11/07/17 0850  TROPONINI 0.03*    Glucose Recent Labs  Lab 11/13/17 0802 11/13/17 1105 11/13/17 1558 11/13/17 2345 11/14/17 0354 11/14/17 0727  GLUCAP 105* 115* 115* 189* 218* 214*    Imaging Dg C-arm 1-60 Min  Result Date: 11/13/2017 CLINICAL DATA:  Right femur fracture, IM nail EXAM: DG C-ARM 61-120 MIN; OPERATIVE RIGHT HIP WITH PELVIS COMPARISON:  11/07/2016 FINDINGS: Multiple intraoperative spot images demonstrate placement of IM nail and dynamic hip screw across the right femoral intertrochanteric fracture. Anatomic alignment. No hardware complicating feature. IMPRESSION: Internal fixation across the right femoral intertrochanteric fracture. No complicating feature. Electronically Signed   By: Rolm Baptise M.D.   On: 11/13/2017 20:19   Dg Hip Operative Unilat With Pelvis Right  Result Date: 11/13/2017 CLINICAL DATA:  Right femur fracture, IM nail EXAM: DG C-ARM 61-120 MIN; OPERATIVE RIGHT HIP WITH PELVIS COMPARISON:  11/07/2016 FINDINGS: Multiple intraoperative spot images demonstrate placement of  IM nail and dynamic hip screw across the right femoral intertrochanteric fracture. Anatomic alignment. No hardware complicating feature. IMPRESSION: Internal fixation across the right femoral intertrochanteric fracture. No complicating feature. Electronically Signed   By: Rolm Baptise M.D.   On: 11/13/2017 20:19   STUDIES:  N/A  CULTURES: Sputum consistent normal respiratory flora.  ANTIBIOTICS: Azetreonam, Vancomycin until 5/24 Perioperative Ancef.  SIGNIFICANT EVENTS: ORIF 5/23  LINES/TUBES: OGT, ETT, PIV, Foley  DISCUSSION: Respiratory failure in patient with COPD - Likely CAP with  associated fall.  Now ready to separate from ventilator but sedation and coughing are complicating SBT.  ASSESSMENT / PLAN:  PULMONARY A: Acute respiratory failure due to CAP P:   VAP bundle SBT today.   CARDIOVASCULAR A:  Hemodynamically stable. Hypertension at home, no signs of CHF P:  Continue current therapy.  Secondary Prevention: ASA  RENAL A:   Volume Status:  Clinically euvolemic P:   Allow autoregulation. Foley: ready to discontinue.  GASTROINTESTINAL A:  Constipation from narcotics. Nutritional Status: moderate nutritional risk. P:   Increase bowel regimen.  HEMATOLOGIC A:   Anemia of acute illness. DVT prophylaxis: Lovenox; Transfusion requirements: no P:  As above.  INFECTIOUS A:   No leukocytosis. Secretions improved. P:   Complete course of antibiotic 5/24 Line Removal: PIV  ENDOCRINE A:   Mild hyperglycemia post operatively Glycemic control: SSI ; Stress steroids: No P:   Reassess insulin requirement post extubation.  NEUROLOGIC A:   Somnolent. Not following commands. Delirium on background of psychiatric illness. P:   RASS goal: 0 Sedation vacation in attempt to extubate. Decrease narcotic as hip now repaired.   FAMILY  - Updates: no family present. Patient is a ward of the state.  - Inter-disciplinary family meet or Palliative Care meeting  due by:  day 7  CRITICAL CARE Performed by: Kipp Brood  Total critical care time: 45 minutes  Critical care time was exclusive of separately billable procedures and treating other patients.  Critical care was necessary to treat or prevent imminent or life-threatening deterioration.  Critical care was time spent personally by me on the following activities: development of treatment plan with patient and/or surrogate as well as nursing, discussions with consultants, evaluation of patient's response to treatment, examination of patient, obtaining history from patient or surrogate, ordering and performing treatments and interventions, ordering and review of laboratory studies, ordering and review of radiographic studies, pulse oximetry and re-evaluation of patient's condition.  Kipp Brood, MD [facility] Pulmonary and [place] Pager: [phone] After hours [phone]  11/14/2017, 8:35 AM

## 2017-11-15 LAB — GLUCOSE, CAPILLARY
GLUCOSE-CAPILLARY: 115 mg/dL — AB (ref 65–99)
GLUCOSE-CAPILLARY: 130 mg/dL — AB (ref 65–99)
Glucose-Capillary: 105 mg/dL — ABNORMAL HIGH (ref 65–99)
Glucose-Capillary: 168 mg/dL — ABNORMAL HIGH (ref 65–99)

## 2017-11-15 LAB — CBC WITH DIFFERENTIAL/PLATELET
Abs Immature Granulocytes: 0.1 10*3/uL (ref 0.0–0.1)
BASOS ABS: 0 10*3/uL (ref 0.0–0.1)
BASOS PCT: 0 %
EOS PCT: 0 %
Eosinophils Absolute: 0 10*3/uL (ref 0.0–0.7)
HEMATOCRIT: 22 % — AB (ref 36.0–46.0)
Hemoglobin: 6.8 g/dL — CL (ref 12.0–15.0)
Immature Granulocytes: 1 %
Lymphocytes Relative: 20 %
Lymphs Abs: 1.7 10*3/uL (ref 0.7–4.0)
MCH: 28.5 pg (ref 26.0–34.0)
MCHC: 30.9 g/dL (ref 30.0–36.0)
MCV: 92.1 fL (ref 78.0–100.0)
Monocytes Absolute: 0.9 10*3/uL (ref 0.1–1.0)
Monocytes Relative: 11 %
Neutro Abs: 5.7 10*3/uL (ref 1.7–7.7)
Neutrophils Relative %: 68 %
Platelets: 245 10*3/uL (ref 150–400)
RBC: 2.39 MIL/uL — AB (ref 3.87–5.11)
RDW: 15 % (ref 11.5–15.5)
WBC: 8.3 10*3/uL (ref 4.0–10.5)

## 2017-11-15 LAB — BASIC METABOLIC PANEL
Anion gap: 9 (ref 5–15)
BUN: 24 mg/dL — AB (ref 6–20)
CALCIUM: 8.6 mg/dL — AB (ref 8.9–10.3)
CHLORIDE: 100 mmol/L — AB (ref 101–111)
CO2: 29 mmol/L (ref 22–32)
CREATININE: 0.43 mg/dL — AB (ref 0.44–1.00)
GFR calc Af Amer: >60 mL/min (ref >60)
GLUCOSE: 122 mg/dL — AB (ref 65–99)
Potassium: 3.8 mmol/L (ref 3.5–5.1)
SODIUM: 138 mmol/L (ref 135–145)

## 2017-11-15 LAB — HEMOGLOBIN AND HEMATOCRIT, BLOOD
HCT: 24.4 % — ABNORMAL LOW (ref 36.0–46.0)
HEMOGLOBIN: 7.9 g/dL — AB (ref 12.0–15.0)

## 2017-11-15 LAB — PREPARE RBC (CROSSMATCH)

## 2017-11-15 MED ORDER — SODIUM CHLORIDE 0.9 % IV SOLN
Freq: Once | INTRAVENOUS | Status: AC
  Administered 2017-11-15: 11:00:00 via INTRAVENOUS

## 2017-11-15 MED ORDER — ENOXAPARIN SODIUM 30 MG/0.3ML ~~LOC~~ SOLN
30.0000 mg | Freq: Two times a day (BID) | SUBCUTANEOUS | Status: DC
  Administered 2017-11-15 – 2017-11-20 (×10): 30 mg via SUBCUTANEOUS
  Filled 2017-11-15 (×10): qty 0.3

## 2017-11-15 MED ORDER — HYDROMORPHONE HCL 2 MG/ML IJ SOLN
1.0000 mg | INTRAMUSCULAR | Status: DC | PRN
  Administered 2017-11-15 – 2017-11-19 (×6): 1 mg via INTRAVENOUS
  Filled 2017-11-15 (×6): qty 1

## 2017-11-15 NOTE — Progress Notes (Signed)
PCCM INTERVAL PROGRESS NOTE  57 year old female admitted with fall and hip fracture as well as CAP requiring mechanical ventilation.  Now POD 2 cephalomedullary nailing of right intertroch femur fracture.   Hemoglobin 6.8 this AM.  Will plan transfuse 1 unit PRBC assuming consent can be obtained from legal guardian.   Georgann Housekeeper, AGACNP-BC [facility] Pulmonology/Critical Care Pager [phone] or [phone]  11/15/2017 4:29 AM

## 2017-11-15 NOTE — Progress Notes (Signed)
PT Cancellation Note  Patient Details Name: Natalie Thomas MRN: [id number] DOB: 18-Feb-1961   Cancelled Treatment:    Reason Eval/Treat Not Completed: Medical issues which prohibited therapy(Chart reviewed, pt still awaiting blood transfusion, H&H lower than what is safe/appropriate for exersion and/or OOB activity with. Will attempt eval again at later date/time. )   2:13 PM, 11/15/17 Etta Grandchild, PT, DPT Physical Therapist - [place] [phone] (Pager)  [phone] (Office)     Foxx Klarich C 11/15/2017, 2:13 PM

## 2017-11-15 NOTE — Progress Notes (Signed)
Orthopaedic Trauma Progress Note  S: Extubated and awake. Confused.  O:  Vitals:   11/15/17 0744 11/15/17 0747  BP:    Pulse:    Resp:    Temp:  98.3 F (36.8 C)  SpO2: 94%    Gen: Awake and alert Right leg: Dressing with mild strikethrough. Swelling appropriate. Moves foot and toes. Sensation intact. Warm and well perfused foot  Labs:  CBC    Component Value Date/Time   WBC 8.3 11/15/2017 0347   RBC 2.39 (L) 11/15/2017 0347   HGB 6.8 (LL) 11/15/2017 0347   HGB 14.4 06/22/2012 0014   HCT 22.0 (L) 11/15/2017 0347   HCT 43.6 06/22/2012 0014   PLT 245 11/15/2017 0347   PLT 189 06/22/2012 0014   MCV 92.1 11/15/2017 0347   MCV 89 06/22/2012 0014   MCH 28.5 11/15/2017 0347   MCHC 30.9 11/15/2017 0347   RDW 15.0 11/15/2017 0347   RDW 17.4 (H) 06/22/2012 0014   LYMPHSABS 1.7 11/15/2017 0347   LYMPHSABS 0.6 (L) 06/01/2012 0458   MONOABS 0.9 11/15/2017 0347   MONOABS 0.2 06/01/2012 0458   EOSABS 0.0 11/15/2017 0347   EOSABS 0.0 06/01/2012 0458   BASOSABS 0.0 11/15/2017 0347   BASOSABS 0.0 06/01/2012 04557    A/P: 57 year old female s/p cephalomedullary nailing of right intertroch femur fracture  Hgb 6.8, plan to transfuse today  Plan for mobilization with therapy  Weightbearing: WBAT RLE Insicional and dressing care: OK to remove dressings POD3 and leave open to air with dry gauze PRN Orthopedic device(s): None Showering: Okay to shower once dressings are removed VTE prophylaxis: Lovenox 40mg  qd 30 days Pain control: Dilaudid Follow - up plan: 2 weeks   Shona Needles, MD [facility] [phone] (phone)

## 2017-11-15 NOTE — Progress Notes (Signed)
Follow up - Critical Care Medicine Note  Patient Details:    Natalie Thomas is an 57 y.o. female. Know schizophrenia COPD in residential care. [place]. Admitted with AMS and hypoxia due to CAP. Fell and fractured hip at the same time.  Lines, Airways, Drains: Urethral Catheter Boyce, Rn Straight-tip;Double-lumen;Latex 14 Fr. (Active)  Indication for Insertion or Continuance of Catheter Acute urinary retention 11/15/2017  8:00 AM  Site Assessment Clean;Intact 11/15/2017  8:00 AM  Catheter Maintenance Bag below level of bladder;Catheter secured;Drainage bag/tubing not touching floor;Insertion date on drainage bag;No dependent loops;Seal intact 11/15/2017  8:00 AM  Collection Container Standard drainage bag 11/15/2017  8:00 AM  Securement Method Leg strap 11/15/2017  8:00 AM  Urinary Catheter Interventions Unclamped 11/15/2017  8:00 AM  Output (mL) 60 mL 11/15/2017  6:00 AM    Anti-infectives:  Anti-infectives (From admission, onward)   Start     Dose/Rate Route Frequency Ordered Stop   11/14/17 0200  ceFAZolin (ANCEF) IVPB 2g/100 mL premix  Status:  Discontinued     2 g 200 mL/hr over 30 Minutes Intravenous Every 8 hours 11/13/17 2313 11/14/17 0846   11/13/17 1926  vancomycin (VANCOCIN) powder  Status:  Discontinued       As needed 11/13/17 1926 11/13/17 2011   11/09/17 1300  vancomycin (VANCOCIN) IVPB 1000 mg/200 mL premix     1,000 mg 200 mL/hr over 60 Minutes Intravenous Every 12 hours 11/09/17 0051 11/15/17 2359   11/09/17 0900  aztreonam (AZACTAM) 2 g in sodium chloride 0.9 % 100 mL IVPB     2 g 200 mL/hr over 30 Minutes Intravenous Every 8 hours 11/09/17 0044 11/16/17 0829   11/09/17 0100  vancomycin (VANCOCIN) IVPB 1000 mg/200 mL premix  Status:  Discontinued     1,000 mg 200 mL/hr over 60 Minutes Intravenous Every 12 hours 11/09/17 0044 11/09/17 0051   11/09/17 0045  vancomycin (VANCOCIN) 1,750 mg in sodium chloride 0.9 % 500 mL IVPB     1,750 mg 250 mL/hr  over 120 Minutes Intravenous  Once 11/09/17 0032 11/09/17 0417   11/09/17 0045  aztreonam (AZACTAM) 2 g in sodium chloride 0.9 % 100 mL IVPB     2 g 200 mL/hr over 30 Minutes Intravenous  Once 11/09/17 0032 11/09/17 0230      Microbiology: Results for orders placed or performed during the hospital encounter of 11/07/17  Urine culture     Status: Abnormal   Collection Time: 11/07/17  8:49 AM  Result Value Ref Range Status   Specimen Description URINE, CLEAN CATCH  Final   Special Requests URINE  Final   Culture MULTIPLE SPECIES PRESENT, SUGGEST RECOLLECTION (A)  Final   Report Status 11/09/2017 FINAL  Final  Surgical PCR screen     Status: Abnormal   Collection Time: 11/08/17  4:54 AM  Result Value Ref Range Status   MRSA, PCR NEGATIVE NEGATIVE Final   Staphylococcus aureus POSITIVE (A) NEGATIVE Final    Comment: (NOTE) The Xpert SA Assay (FDA approved for NASAL specimens in patients 11 years of age and older), is one component of a comprehensive surveillance program. It is not intended to diagnose infection nor to guide or monitor treatment. Performed at [facility], [place] [address], [place], [place] [address]   Culture, respiratory (NON-Expectorated)     Status: None   Collection Time: 11/09/17  1:28 AM  Result Value Ref Range Status   Specimen Description TRACHEAL ASPIRATE  Final  Special Requests NONE  Final   Gram Stain NO WBC SEEN NO ORGANISMS SEEN   Final   Culture   Final    RARE Consistent with normal respiratory flora. Performed at [facility], [place] [address] [place], [place] [address]    Report Status 11/11/2017 FINAL  Final  Culture, respiratory (NON-Expectorated)     Status: None   Collection Time: 11/10/17 11:51 PM  Result Value Ref Range Status   Specimen Description TRACHEAL ASPIRATE  Final   Special Requests NONE  Final   Gram Stain   Final    ABUNDANT WBC PRESENT, PREDOMINANTLY PMN RARE GRAM POSITIVE COCCI    Culture   Final    RARE  Consistent with normal respiratory flora. Performed at [facility], [place] [address], [place], [place] [address]    Report Status 11/13/2017 FINAL  Final   Laboratory: CBC    Component Value Date/Time   WBC 8.3 11/15/2017 0347   RBC 2.39 (L) 11/15/2017 0347   HGB 6.8 (LL) 11/15/2017 0347   HGB 14.4 06/22/2012 0014   HCT 22.0 (L) 11/15/2017 0347   HCT 43.6 06/22/2012 0014   PLT 245 11/15/2017 0347   PLT 189 06/22/2012 0014   MCV 92.1 11/15/2017 0347   MCV 89 06/22/2012 0014   MCH 28.5 11/15/2017 0347   MCHC 30.9 11/15/2017 0347   RDW 15.0 11/15/2017 0347   RDW 17.4 (H) 06/22/2012 0014   LYMPHSABS 1.7 11/15/2017 0347   LYMPHSABS 0.6 (L) 06/01/2012 0458   MONOABS 0.9 11/15/2017 0347   MONOABS 0.2 06/01/2012 0458   EOSABS 0.0 11/15/2017 0347   EOSABS 0.0 06/01/2012 0458   BASOSABS 0.0 11/15/2017 0347   BASOSABS 0.0 06/01/2012 0458   BMET    Component Value Date/Time   NA 138 11/15/2017 0347   NA 128 (L) 06/23/2012 0536   K 3.8 11/15/2017 0347   K 4.3 06/23/2012 0536   CL 100 (L) 11/15/2017 0347   CL 92 (L) 06/23/2012 0536   CO2 29 11/15/2017 0347   CO2 28 06/23/2012 0536   GLUCOSE 122 (H) 11/15/2017 0347   GLUCOSE 237 (H) 06/23/2012 0536   BUN 24 (H) 11/15/2017 0347   BUN 4 (L) 06/23/2012 0536   CREATININE 0.43 (L) 11/15/2017 0347   CREATININE 0.42 (L) 06/23/2012 0536   CALCIUM 8.6 (L) 11/15/2017 0347   CALCIUM 9.1 06/23/2012 0536   CALCIUM 9.3 04/09/2011 1058   GFRNONAA >60 11/15/2017 0347   GFRNONAA >60 06/23/2012 0536   GFRAA >60 11/15/2017 0347   GFRAA >60 06/23/2012 0536    Best Practice/Protocols:  VTE Prophylaxis: Lovenox (prophylaxtic dose) No protocolized care.  Events:   Studies: Dg Chest 1 View  Result Date: 11/07/2017 CLINICAL DATA:  Status post fall, with concern for chest injury. Initial encounter. EXAM: CHEST  1 VIEW COMPARISON:  Chest radiograph performed 11/04/2017 FINDINGS: The lungs are well-aerated. Mild vascular congestion is  noted. Peribronchial thickening is seen. Chronically increased interstitial markings are noted. There is no evidence of pleural effusion or pneumothorax. The cardiomediastinal silhouette is within normal limits. No acute osseous abnormalities are seen. IMPRESSION: Mild vascular congestion noted. Peribronchial thickening seen. Chronically increased interstitial markings noted. No displaced rib fractures identified. Electronically Signed   By: Garald Balding M.D.   On: 11/07/2017 06:47   Dg Chest 2 View  Result Date: 11/04/2017 CLINICAL DATA:  Abdominal pain for a month, difficulty breathing, wheezing. EXAM: CHEST - 2 VIEW COMPARISON:  Chest x-ray dated  10/16/2017. Chest x-ray dated 06/22/2012. FINDINGS: Stable cardiomegaly. Lungs are clear. No pleural effusion or pneumothorax seen. Osseous structures about the chest are unremarkable. IMPRESSION: No active cardiopulmonary disease. No evidence of pneumonia or pulmonary edema. Electronically Signed   By: Franki Cabot M.D.   On: 11/04/2017 20:30   Ct Angio Chest Pe W Or Wo Contrast  Result Date: 11/09/2017 CLINICAL DATA:  Shortness of breath and hypoxia. Preoperative for femur fracture repair. EXAM: CT ANGIOGRAPHY CHEST WITH CONTRAST TECHNIQUE: Multidetector CT imaging of the chest was performed using the standard protocol during bolus administration of intravenous contrast. Multiplanar CT image reconstructions and MIPs were obtained to evaluate the vascular anatomy. CONTRAST:  1105mL ISOVUE-370 IOPAMIDOL (ISOVUE-370) INJECTION 76% COMPARISON:  Chest radiograph performed earlier today at 9:58 p.m., and CTA of the chest performed 11/19/2011 FINDINGS: Cardiovascular:  There is no evidence of pulmonary embolus. The heart is borderline normal in size. Scattered coronary artery calcifications are seen. Scattered calcification is noted along the aortic arch and proximal great vessels, with moderate to severe luminal narrowing at the origin of the left subclavian artery  secondary to calcification. Mediastinum/Nodes: A small pericardial effusion is noted. No mediastinal lymphadenopathy is seen. The thyroid gland is unremarkable. No axillary lymphadenopathy is seen. Lungs/Pleura: There is near-complete opacification of the left mainstem bronchus and bronchioles, reflecting aspiration. There is near complete consolidation of the left lower lobe, reflecting aspiration pneumonia. Mild airspace opacity is noted at the right middle lobe. No significant pleural effusion or pneumothorax is seen. Blebs are noted at the left lung apex. Upper Abdomen: The visualized portions of the liver and spleen are unremarkable. Musculoskeletal: No acute osseous abnormalities are identified. There is mild chronic loss of height at the mid to lower thoracic spine. Endplate irregularity at the midthoracic spine is nonspecific. The visualized musculature is unremarkable in appearance. Review of the MIP images confirms the above findings. IMPRESSION: 1. No evidence of pulmonary embolus. 2. Near complete opacification of the left mainstem bronchus and bronchioles, and dense consolidation of the left lower lobe, reflecting aspiration pneumonia. 3. Mild airspace opacity at the right middle lobe. 4. Scattered coronary artery calcifications seen. 5. Small pericardial effusion noted. 6. Scattered calcification along the aortic arch and proximal great vessels, with moderate to severe luminal narrowing at the origin of the left subclavian artery secondary to calcification. 7. Nonspecific endplate irregularity of the midthoracic spine. Would correlate for any associated symptoms. MRI could be considered for further evaluation, if the patient has significant mid back pain. Electronically Signed   By: Garald Balding M.D.   On: 11/09/2017 01:20   Dg Chest Port 1 View  Result Date: 11/14/2017 CLINICAL DATA:  Respiratory failure. EXAM: PORTABLE CHEST 1 VIEW COMPARISON:  Radiograph of Nov 11, 2017. FINDINGS: Stable  cardiomegaly. Atherosclerosis of thoracic aorta is noted. No pneumothorax is noted. Nasogastric tube is seen entering stomach. Endotracheal tube is seen projected over tracheal air shadow with distal tip 1 cm above the carina. Withdrawal by 2-3 cm is recommended. Increased bibasilar edema or atelectasis is noted with associated pleural effusions. Bony thorax is unremarkable. IMPRESSION: Increased bibasilar opacities are noted concerning for edema or atelectasis with associated pleural effusions. Endotracheal tube tip is 1 cm above the carina; withdrawal by 2-3 cm is recommended. Aortic Atherosclerosis (ICD10-I70.0). Electronically Signed   By: Marijo Conception, M.D.   On: 11/14/2017 09:06   Dg Chest Port 1 View  Result Date: 11/11/2017 CLINICAL DATA:  History of endotracheal tube EXAM: PORTABLE CHEST  1 VIEW COMPARISON:  Yesterday FINDINGS: Endotracheal tube tip 2 cm above the carina. An orogastric tube reaches the stomach. Tubing over the left chest is attributed to nasogastric tube external to the patient. There is artifact from EKG leads. Improved aeration on the left, but still complete opacification of the lower lobe. No Kerley lines or pneumothorax. IMPRESSION: 1. Stable positioning of endotracheal and orogastric tubes. 2. Improved aeration on the left, but persistent lower lobe collapse. Electronically Signed   By: Monte Fantasia M.D.   On: 11/11/2017 07:07   Dg Chest Port 1 View  Result Date: 11/10/2017 CLINICAL DATA:  Oxygen desaturation EXAM: PORTABLE CHEST 1 VIEW COMPARISON:  11/10/2017, 11/09/2017, CT 11/08/2017 FINDINGS: Endotracheal tube tip is about 1 cm superior to the carina. Esophageal tube tip is below the diaphragm but non included. Mild mediastinal shift to the left. Persistent dense left lung base consolidation and probable small left effusion. Aortic atherosclerosis. Cardiomegaly with vascular congestion. No pneumothorax. IMPRESSION: 1. Endotracheal tube tip about 1 cm superior to  carina 2. Increased hazy opacity of left thorax, possible layering effusion. Cardiomegaly with dense left lung base consolidation as before. Electronically Signed   By: Donavan Foil M.D.   On: 11/10/2017 22:28   Dg Chest Port 1 View  Result Date: 11/10/2017 CLINICAL DATA:  Assess endotracheal tube position. EXAM: PORTABLE CHEST 1 VIEW COMPARISON:  Chest radiograph performed 11/09/2017 FINDINGS: The patient's endotracheal tube is seen ending 1-2 cm above the carina. An enteric tube is noted extending below the diaphragm. A small left pleural effusion is noted. Retrocardiac airspace opacification may reflect pneumonia, mostly new from the recent prior study. Underlying vascular congestion is noted. No pneumothorax is seen. The cardiomediastinal silhouette is borderline normal in size. No acute osseous abnormalities are identified. IMPRESSION: 1. Endotracheal tube seen ending 1-2 cm above the carina. 2. Small left pleural effusion noted. Retrocardiac airspace opacification may reflect pneumonia, mostly new from the recent prior study. 3. Underlying vascular congestion noted. Electronically Signed   By: Garald Balding M.D.   On: 11/10/2017 05:38   Dg Chest Port 1 View  Result Date: 11/09/2017 CLINICAL DATA:  Endotracheal and orogastric tube placement. EXAM: PORTABLE CHEST 1 VIEW COMPARISON:  Chest radiograph and CTA of the chest performed 11/08/2017 FINDINGS: The patient's endotracheal tube is seen ending 2-3 cm above the carina. An enteric tube is noted extending below the diaphragm. The lungs are well-aerated. Mild left basilar airspace opacity may reflect atelectasis or pneumonia, improved from the prior study. The previously noted left-sided pleural effusion is less apparent. There is no evidence of pneumothorax. The cardiomediastinal silhouette is within normal limits. No acute osseous abnormalities are seen. IMPRESSION: 1. Endotracheal tube seen ending 2-3 cm above the carina. 2. Enteric tube noted  extending below the diaphragm. 3. Mild left basilar airspace opacity may reflect atelectasis or pneumonia, improved from the prior study. Electronically Signed   By: Garald Balding M.D.   On: 11/09/2017 01:22   Dg Chest Port 1 View  Result Date: 11/08/2017 CLINICAL DATA:  Acute onset of respiratory failure. Hypoxia. EXAM: PORTABLE CHEST 1 VIEW COMPARISON:  Chest radiograph performed earlier today at 6:24 a.m. FINDINGS: A small to moderate left-sided pleural effusion is noted, with left basilar airspace opacification. This may reflect asymmetric pulmonary edema or pneumonia, worsened from the prior study. Minimal right basilar atelectasis is noted. There is no evidence of pneumothorax. The cardiomediastinal silhouette is borderline normal in size. No acute osseous abnormalities are seen. IMPRESSION: Small to  moderate left-sided pleural effusion, with left basilar airspace opacification. This may reflect asymmetric pulmonary edema or pneumonia, worsened from the prior study. Minimal right basilar atelectasis noted. Electronically Signed   By: Garald Balding M.D.   On: 11/08/2017 22:15   Dg Chest Port 1 View  Result Date: 11/08/2017 CLINICAL DATA:  Acute onset of respiratory distress. EXAM: PORTABLE CHEST 1 VIEW COMPARISON:  Chest radiograph performed 11/07/2017 FINDINGS: Lung expansion is mildly decreased. Patchy left-sided airspace opacity may reflect atelectasis or pneumonia. The right lung remains relatively clear. Mild vascular congestion is noted. No pleural effusion or pneumothorax is seen. The cardiomediastinal silhouette is borderline normal in size. No acute osseous abnormalities are identified. IMPRESSION: Patchy left-sided airspace opacity may reflect atelectasis or pneumonia. Mild vascular congestion noted. Electronically Signed   By: Garald Balding M.D.   On: 11/08/2017 06:34   Dg Abd 2 Views  Result Date: 11/04/2017 CLINICAL DATA:  Abdominal pain EXAM: ABDOMEN - 2 VIEW COMPARISON:   10/16/2017, CT 10/06/2017 FINDINGS: Visible lung bases are clear. Surgical clips in the right upper quadrant. No definite free air on decubitus view. Mild gas-filled small and large bowel without obstructive pattern. Prior right superior and inferior pubic rami fractures. Dense sclerosis in the sacrum. IMPRESSION: 1. Nonobstructed bowel-gas pattern 2. Subacute appearing right superior and inferior pubic rami fractures. Electronically Signed   By: Donavan Foil M.D.   On: 11/04/2017 22:19   Dg C-arm 1-60 Min  Result Date: 11/13/2017 CLINICAL DATA:  Right femur fracture, IM nail EXAM: DG C-ARM 61-120 MIN; OPERATIVE RIGHT HIP WITH PELVIS COMPARISON:  11/07/2016 FINDINGS: Multiple intraoperative spot images demonstrate placement of IM nail and dynamic hip screw across the right femoral intertrochanteric fracture. Anatomic alignment. No hardware complicating feature. IMPRESSION: Internal fixation across the right femoral intertrochanteric fracture. No complicating feature. Electronically Signed   By: Rolm Baptise M.D.   On: 11/13/2017 20:19   Dg Hip Operative Unilat With Pelvis Right  Result Date: 11/13/2017 CLINICAL DATA:  Right femur fracture, IM nail EXAM: DG C-ARM 61-120 MIN; OPERATIVE RIGHT HIP WITH PELVIS COMPARISON:  11/07/2016 FINDINGS: Multiple intraoperative spot images demonstrate placement of IM nail and dynamic hip screw across the right femoral intertrochanteric fracture. Anatomic alignment. No hardware complicating feature. IMPRESSION: Internal fixation across the right femoral intertrochanteric fracture. No complicating feature. Electronically Signed   By: Rolm Baptise M.D.   On: 11/13/2017 20:19   Dg Hip Unilat With Pelvis 2-3 Views Right  Result Date: 11/14/2017 CLINICAL DATA:  ORIF. EXAM: DG HIP (WITH OR WITHOUT PELVIS) 2-3V RIGHT COMPARISON:  11/13/2017. FINDINGS: Prior ORIF proximal right femur/hip. Hardware intact. Anatomic alignment. Fractures of the right superior inferior pubic rami  are again noted. Postsurgical changes again noted in the sacrum bilaterally. IMPRESSION: ORIF proximal right femur/hip with anatomic alignment. Hardware intact. Electronically Signed   By: Marcello Moores  Register   On: 11/14/2017 09:01   Dg Hip Unilat  With Pelvis 2-3 Views Right  Result Date: 11/07/2017 CLINICAL DATA:  Status post fall, with right hip pain, acute onset. Initial encounter. EXAM: DG HIP (WITH OR WITHOUT PELVIS) 2-3V RIGHT COMPARISON:  Right hip radiographs performed 07/31/2017 FINDINGS: There is a comminuted right femoral intertrochanteric fracture, with mild medial rotation of the distal femur. The right femoral head remains seated at the acetabulum. The left hip joint is unremarkable. There appears to be healing chronic fractures of the right superior and inferior pubic rami. Postoperative changes are noted at the sacrum bilaterally. The visualized bowel gas pattern  is grossly unremarkable. IMPRESSION: 1. Comminuted right femoral intertrochanteric fracture, with mild medial rotation of the distal femur. 2. Healing chronic fractures of the right superior and inferior pubic rami. Electronically Signed   By: Garald Balding M.D.   On: 11/07/2017 06:46    Consults: Treatment Team:  Shona Needles, MD   Subjective:    Overnight Issues:   Objective:  Vital signs for last 24 hours: Temp:  [97.8 F (36.6 C)-99.6 F (37.6 C)] 98.2 F (36.8 C) (05/24 1309) Pulse Rate:  [68-108] 68 (05/24 1309) Resp:  [10-18] 18 (05/24 1309) BP: (121-159)/(60-98) 158/60 (05/24 1309) SpO2:  [93 %-100 %] 94 % (05/24 1309) Weight:  [209 lb 7 oz (95 kg)] 209 lb 7 oz (95 kg) (05/24 0401)  Hemodynamic parameters for last 24 hours:    Intake/Output from previous day: 05/23 0701 - 05/24 0700 In: 1942 [P.O.:940; I.V.:249.5; NG/GT:52.5; IV Piggyback:700] Out: 1580 [Urine:1580]  Intake/Output this shift: Total I/O In: 622 [I.V.:250; Blood:372] Out: -   Vent settings for last 24 hours:    Physical Exam:   General: alert and no respiratory distress Neuro: alert, nonfocal exam and Agitated with baseline psychiatric illness. HEENT/Neck: no JVD Resp: clear to auscultation bilaterally CVS: regular rate and rhythm, S1, S2 normal, no murmur, click, rub or gallop GI: soft, nontender, BS WNL, no r/g Skin: no rash Extremities: no edema, no erythema, pulses WNL and Incision intact right hip.  Assessment/Plan:   NEURO  Altered Mental Status:  schizophrenia and possibly at baseline.   Plan: Continue home psychiatric medications.  PULM  Pneumonia: community-acquired unknown organism. COPD without active wheezing.   Plan: Complete course of antibiotics. Mobilize. Continue home inhalers.  CARDIO  Normotensive.    Plan: Continue home clonidine.  RENAL  Clinically euvolemic.     Plan: Allow autoregulation.  GI  Cleared for oral diet.   Plan: Diet as tolerated with fluid restriction as patient drinks excessively.  ID  Pneumonia (community-acquired unknown organism.)   Plan: Complete scheduled course of antibiotics.  HEME  Anemia acute blood loss anemia) due to hip fracture and recent operation.    Plan: Transfuse 1 unit and reassess.  ENDO Euglycemic with minimal SSI use.   Plan: Continue current management.  Global Issues   Ready for transfer.    LOS: 8 days   Additional comments:None   Ketty Bitton 11/15/2017  *Care during the described time interval was provided by me and/or other providers on the critical care team.  I have reviewed this patient's available data, including medical history, events of note, physical examination and test results as part of my evaluation.

## 2017-11-15 NOTE — Progress Notes (Signed)
Attempted to reach legal guardian Natalie Thomas twice for obtaining blood consent. Left voicemail. Will attempt again later.

## 2017-11-15 NOTE — Progress Notes (Signed)
Nutrition Follow-up  DOCUMENTATION CODES:   Obesity unspecified  INTERVENTION:    Continue Soft diet.  RD to monitor adequacy of intake and add PO supplements as needed.  NUTRITION DIAGNOSIS:   Inadequate oral intake related to inability to eat as evidenced by NPO status.  Resolved  GOAL:   Provide needs based on ASPEN/SCCM guidelines  Met, will continue to monitor  MONITOR:   Vent status, TF tolerance, Labs, I & O's  REASON FOR ASSESSMENT:   Ventilator, Consult Enteral/tube feeding initiation and management  ASSESSMENT:   57 yo female with PMH of depression, GERD, HTN, osteoporosis, DM-2, CHF, HLD,COPD, schizophrenia, anemia, and chronic hyponatremia who was admitted on 5/16 with abdominal pain, ileus. Developed hypoxemic respiratory failure and required intubation on 5/18.  Extubated 5/23. Diet has been advanced to Soft with 2 L fluid restriction. RN reports patient has been eating very well, consumed all of breakfast this morning. She drinks liquids excessively, so she is on a 2 L fluid restriction. Labs reviewed. CBG's: 115-105-130 Medications reviewed and include Colace.   Diet Order:   Diet Order           DIET SOFT Room service appropriate? Yes; Fluid consistency: Thin; Fluid restriction: 2000 mL Fluid  Diet effective now          EDUCATION NEEDS:   No education needs have been identified at this time  Skin:  Skin Assessment: Reviewed RN Assessment(MASD groin)  Last BM:  5/24  Height:   Ht Readings from Last 1 Encounters:  11/08/17 5' 4" (1.626 m)    Weight:   Wt Readings from Last 1 Encounters:  11/15/17 209 lb 7 oz (95 kg)    Ideal Body Weight:  54.5 kg  BMI:  Body mass index is 35.95 kg/m.  Estimated Nutritional Needs:   Kcal:  1650-1850  Protein:  95-105 gm  Fluid:  1.8 L    Molli Barrows, RD, LDN, [place] Pager [phone] After Hours Pager [phone]

## 2017-11-16 LAB — TYPE AND SCREEN
ABO/RH(D): A POS
ANTIBODY SCREEN: NEGATIVE
Unit division: 0

## 2017-11-16 LAB — CBC WITH DIFFERENTIAL/PLATELET
Abs Immature Granulocytes: 0.1 10*3/uL (ref 0.0–0.1)
BASOS ABS: 0 10*3/uL (ref 0.0–0.1)
Basophils Relative: 0 %
EOS ABS: 0.1 10*3/uL (ref 0.0–0.7)
EOS PCT: 1 %
HEMATOCRIT: 25.4 % — AB (ref 36.0–46.0)
Hemoglobin: 8.2 g/dL — ABNORMAL LOW (ref 12.0–15.0)
Immature Granulocytes: 1 %
LYMPHS PCT: 15 %
Lymphs Abs: 1.2 10*3/uL (ref 0.7–4.0)
MCH: 28.6 pg (ref 26.0–34.0)
MCHC: 32.3 g/dL (ref 30.0–36.0)
MCV: 88.5 fL (ref 78.0–100.0)
Monocytes Absolute: 0.9 10*3/uL (ref 0.1–1.0)
Monocytes Relative: 12 %
NEUTROS PCT: 71 %
Neutro Abs: 5.8 10*3/uL (ref 1.7–7.7)
Platelets: 260 10*3/uL (ref 150–400)
RBC: 2.87 MIL/uL — AB (ref 3.87–5.11)
RDW: 14.8 % (ref 11.5–15.5)
WBC: 8.1 10*3/uL (ref 4.0–10.5)

## 2017-11-16 LAB — BASIC METABOLIC PANEL
ANION GAP: 9 (ref 5–15)
BUN: 13 mg/dL (ref 6–20)
CALCIUM: 8.6 mg/dL — AB (ref 8.9–10.3)
CO2: 28 mmol/L (ref 22–32)
Chloride: 98 mmol/L — ABNORMAL LOW (ref 101–111)
Creatinine, Ser: 0.37 mg/dL — ABNORMAL LOW (ref 0.44–1.00)
Glucose, Bld: 89 mg/dL (ref 65–99)
POTASSIUM: 3.5 mmol/L (ref 3.5–5.1)
SODIUM: 135 mmol/L (ref 135–145)

## 2017-11-16 LAB — BPAM RBC
Blood Product Expiration Date: 201906032359
ISSUE DATE / TIME: 201905240947
UNIT TYPE AND RH: 6200

## 2017-11-16 MED ORDER — OXYCODONE HCL 5 MG PO TABS
5.0000 mg | ORAL_TABLET | Freq: Four times a day (QID) | ORAL | Status: DC | PRN
  Administered 2017-11-16 – 2017-11-20 (×9): 5 mg via ORAL
  Filled 2017-11-16 (×9): qty 1

## 2017-11-16 MED ORDER — ACETAMINOPHEN 160 MG/5ML PO SOLN
650.0000 mg | Freq: Four times a day (QID) | ORAL | Status: DC | PRN
Start: 2017-11-16 — End: 2017-11-20
  Administered 2017-11-20: 650 mg via ORAL
  Filled 2017-11-16: qty 20.3

## 2017-11-16 MED ORDER — VALPROIC ACID 250 MG PO CAPS
250.0000 mg | ORAL_CAPSULE | Freq: Two times a day (BID) | ORAL | Status: DC
  Administered 2017-11-16 – 2017-11-20 (×9): 250 mg via ORAL
  Filled 2017-11-16 (×9): qty 1

## 2017-11-16 NOTE — Progress Notes (Signed)
PROGRESS NOTE    Natalie Thomas  EXH:[id number] DOB: 1960-08-01 DOA: 11/07/2017 PCP: Patient, No Pcp Per   Brief Narrative: 57 y.o. female with history of CHF, COPD on home oxygen at 2 L nasal cannula, depression, diabetes type 2, GERD, hypertension, chronic hyponatremia baseline 131, bipolar disorder, depression, GERD, [facility], after sustaining an unwitnessed fall.  The patient was found lying on the floor, unknown length.  The patient endorsed right hip pain immediately after being found.  She is a poor historian, and she may have some degree of confusion at this time, for which other information cannot be obtained.   She manages to say "yes" or "no", denying shortness of breath, chest pain, abdominal discomfort. She does admit to be very thirsty, drinking "a lot" of water and ice chips .  She had been seen in the ER on May 13 with abdominal pain felt to be secondary to constipation due to narcotics, and she had one prior admission from 4/24 through 10/19/2017 for enteritis and dyspnea, placed on Solu-Medrol,  At the time her sodium was 128, improved with IV fluids.  At time of discharge, she was in fairly stable clinical condition.  At the time, she was sent home with sodium tablets 1 g 3 times a day, unclear if she had been taking it .No new infections. No other reported falls   Right hip x-ray comminuted right intertrochanteric fracture with medial rotation of the distal femur.      Assessment & Plan:   Principal Problem:   Closed comminuted intertrochanteric fracture of right femur (HCC) Active Problems:   Chronic hyponatremia   Essential hypertension   GERD   Osteoporosis   Lumbar disc disease   Polycythemia secondary to smoking   Schizoaffective disorder, bipolar type (HCC)   Hyponatremia   Incontinence of urine   Hyperlipidemia   Chronic respiratory failure with hypoxia (HCC)   Anemia   Anxiety and depression   S/P right  hip fracture 1] right hip fracture status post fall and cephalo-medullary nailing of right intertrochanteric femur fracture patient is weightbearing as tolerated to right lower extremity.  2] chronic hyponatremia due to polydipsia  3] history of COPD chronic hypoxic respiratory failure uses 2 L of oxygen at home/community-acquired pneumonia  4] history of diastolic CHF echo 3/81/0175 with 55 to 60% ejection fraction with normal systolic and grade 1 diastolic dysfunction.  5] anemia of chronic disease hemoglobin was 6.8 yesterday up to 8.2 after blood transfusion.  6] type2 diabetes not on any insulin at this time blood sugar stable  7] history of schizophrenia mood disorder on multiple psych meds Latuda, Paxil, Wellbutrin, Depakote, Desyrel and Xanax.  8] history of hypertension    DVT prophylaxis Lovenox :Code Status: Full code  Disposition Plan: TBD Consultants:  Ortho, PCCM  Procedures: Right hip cephalo-medullary nailing of the right intertrochanteric femur fracture Antimicrobials: None  Subjective: Has complaints of pain all over.  Objective: Vitals:   11/15/17 1933 11/15/17 2231 11/16/17 0540 11/16/17 0954  BP:  (!) 141/57 (!) 155/63   Pulse:  72 71   Resp:  16    Temp:  98.1 F (36.7 C) 98.9 F (37.2 C)   TempSrc:  Oral Oral   SpO2: 96% 97% 97% 97%  Weight:      Height:        Intake/Output Summary (Last 24 hours) at 11/16/2017 1042 Last data filed at 11/16/2017 0502 Gross per  24 hour  Intake 919 ml  Output 350 ml  Net 569 ml   Filed Weights   11/13/17 0442 11/14/17 0402 11/15/17 0401  Weight: 99.4 kg (219 lb 2.2 oz) 95.5 kg (210 lb 8.6 oz) 95 kg (209 lb 7 oz)    Examination:  General exam: Appears calm and comfortable  Respiratory system: Clear to auscultation. Respiratory effort normal. Cardiovascular system: S1 & S2 heard, RRR. No JVD, murmurs, rubs, gallops or clicks. No pedal edema. Gastrointestinal system: Abdomen is nondistended, soft and  nontender. No organomegaly or masses felt. Normal bowel sounds heard. Central nervous system: Alert and oriented. No focal neurological deficits. Extremities: Symmetric 5 x 5 power. Skin: No rashes, lesions or ulcers Psychiatry: Judgement and insight appear normal. Mood & affect appropriate.     Data Reviewed: I have personally reviewed following labs and imaging studies  CBC: Recent Labs  Lab 11/12/17 0433 11/13/17 0253 11/14/17 0355 11/15/17 0347 11/15/17 1342 11/16/17 0400  WBC 7.8 6.0 8.4 8.3  --  8.1  NEUTROABS  --   --   --  5.7  --  5.8  HGB 8.0* 7.5* 7.9* 6.8* 7.9* 8.2*  HCT 25.5* 23.9* 25.3* 22.0* 24.4* 25.4*  MCV 91.1 93.0 92.3 92.1  --  88.5  PLT 214 194 225 245  --  732   Basic Metabolic Panel: Recent Labs  Lab 11/10/17 0101  11/11/17 0446  11/12/17 0433 11/13/17 0253 11/14/17 0355 11/15/17 0347 11/16/17 0400  NA 132*   < > 132*   < > 136 137 136 138 135  K 4.4   < > 4.7   < > 4.4 4.3 4.6 3.8 3.5  CL 95*   < > 96*   < > 99* 102 100* 100* 98*  CO2 29   < > 27   < > 30 27 28 29 28   GLUCOSE 153*   < > 125*   < > 130* 142* 239* 122* 89  BUN 23*   < > 34*   < > 31* 26* 25* 24* 13  CREATININE 0.79   < > 0.68   < > 0.53 0.45 0.51 0.43* 0.37*  CALCIUM 8.4*   < > 8.4*   < > 8.4* 8.4* 8.7* 8.6* 8.6*  MG 1.9  --  1.9  --   --   --  1.8  --   --   PHOS 4.1  --  4.1  --   --   --  5.1*  --   --    < > = values in this interval not displayed.   GFR: Estimated Creatinine Clearance: 87.8 mL/min (A) (by C-G formula based on SCr of 0.37 mg/dL (L)). Liver Function Tests: No results for input(s): AST, ALT, ALKPHOS, BILITOT, PROT, ALBUMIN in the last 168 hours. No results for input(s): LIPASE, AMYLASE in the last 168 hours. No results for input(s): AMMONIA in the last 168 hours. Coagulation Profile: No results for input(s): INR, PROTIME in the last 168 hours. Cardiac Enzymes: No results for input(s): CKTOTAL, CKMB, CKMBINDEX, TROPONINI in the last 168 hours. BNP (last  3 results) No results for input(s): PROBNP in the last 8760 hours. HbA1C: No results for input(s): HGBA1C in the last 72 hours. CBG: Recent Labs  Lab 11/14/17 2337 11/15/17 0424 11/15/17 0744 11/15/17 1135 11/15/17 1546  GLUCAP 123* 115* 105* 130* 168*   Lipid Profile: Recent Labs    11/13/17 2347  TRIG 153*   Thyroid Function Tests: No  results for input(s): TSH, T4TOTAL, FREET4, T3FREE, THYROIDAB in the last 72 hours. Anemia Panel: No results for input(s): VITAMINB12, FOLATE, FERRITIN, TIBC, IRON, RETICCTPCT in the last 72 hours. Sepsis Labs: Recent Labs  Lab 11/10/17 0101 11/10/17 2243  PROCALCITON 0.19  --   LATICACIDVEN  --  0.9    Recent Results (from the past 240 hour(s))  Urine culture     Status: Abnormal   Collection Time: 11/07/17  8:49 AM  Result Value Ref Range Status   Specimen Description URINE, CLEAN CATCH  Final   Special Requests URINE  Final   Culture MULTIPLE SPECIES PRESENT, SUGGEST RECOLLECTION (A)  Final   Report Status 11/09/2017 FINAL  Final  Surgical PCR screen     Status: Abnormal   Collection Time: 11/08/17  4:54 AM  Result Value Ref Range Status   MRSA, PCR NEGATIVE NEGATIVE Final   Staphylococcus aureus POSITIVE (A) NEGATIVE Final    Comment: (NOTE) The Xpert SA Assay (FDA approved for NASAL specimens in patients 28 years of age and older), is one component of a comprehensive surveillance program. It is not intended to diagnose infection nor to guide or monitor treatment. Performed at [facility], [place] [address] [place], [place] [address]   Culture, respiratory (NON-Expectorated)     Status: None   Collection Time: 11/09/17  1:28 AM  Result Value Ref Range Status   Specimen Description TRACHEAL ASPIRATE  Final   Special Requests NONE  Final   Gram Stain NO WBC SEEN NO ORGANISMS SEEN   Final   Culture   Final    RARE Consistent with normal respiratory flora. Performed at [facility], [place] [address]  [place], [place] [address]    Report Status 11/11/2017 FINAL  Final  Culture, respiratory (NON-Expectorated)     Status: None   Collection Time: 11/10/17 11:51 PM  Result Value Ref Range Status   Specimen Description TRACHEAL ASPIRATE  Final   Special Requests NONE  Final   Gram Stain   Final    ABUNDANT WBC PRESENT, PREDOMINANTLY PMN RARE GRAM POSITIVE COCCI    Culture   Final    RARE Consistent with normal respiratory flora. Performed at [facility], [place] [address], [place], [place] [address]    Report Status 11/13/2017 FINAL  Final         Radiology Studies: No results found.      Scheduled Meds: . ALPRAZolam  0.5 mg Oral TID  . arformoterol  15 mcg Nebulization BID  . aspirin  81 mg Oral Daily  . budesonide (PULMICORT) nebulizer solution  0.5 mg Nebulization BID  . cloNIDine  0.1 mg Oral Daily  . docusate  100 mg Oral BID  . enoxaparin (LOVENOX) injection  30 mg Subcutaneous Q12H  . ipratropium-albuterol  3 mL Nebulization TID  . lurasidone  40 mg Oral Q breakfast  . pantoprazole  40 mg Oral Daily  . PARoxetine  20 mg Oral Daily  . sennosides  5 mL Oral QHS  . traZODone  50 mg Oral QHS  . valproic acid  250 mg Oral BID   Continuous Infusions: . sodium chloride Stopped (11/15/17 1300)     LOS: 9 days     Georgette Shell, MD [facility]  If 7PM-7AM, please contact night-coverage [email or web address] Password [facility] 11/16/2017, 10:42 AM

## 2017-11-16 NOTE — Evaluation (Signed)
Physical Therapy Evaluation Patient Details Name: Natalie Thomas MRN: [id number] DOB: 03-09-1961 Today's Date: 11/16/2017   History of Present Illness  57 y.o. female admitted on 11/07/17 for R hip fx s/p fall s/p IM nail on 11/14/17.  pt needed PRBCs post op on 11/15/17 and also has hyponatremia.  Pt with other significant PMH of COPD on O2 [place] at baseline, schizoaffective disorder (bipolar type), lumbar disc disease, incontinence of urine, HTN, DM2, CHF, anemia, and pelvic fx surgery.   Clinical Impression  Limited eval due to pt confusion and will need second person to attempt EOB or standing EOB. Pt will require SNF at discharge.     Follow Up Recommendations SNF    Equipment Recommendations  Wheelchair (measurements PT);Wheelchair cushion (measurements PT);Hospital bed;Other (comment)(hoyer lift)    Recommendations for Other Services   NA    Precautions / Restrictions Precautions Precautions: Fall Restrictions RLE Weight Bearing: Weight bearing as tolerated(per Op note)      Mobility  Bed Mobility Overal bed mobility: Needs Assistance Bed Mobility: Rolling Rolling: Max assist         General bed mobility comments: Max assist to roll bil for peri care as pt had a BM.  Pt yelling, "get iron man" throughout mobility.   Transfers                 General transfer comment: Unable without second person or lift.                Pertinent Vitals/Pain Pain Assessment: Faces Faces Pain Scale: Hurts whole lot Pain Location: right and left leg with mobility (R>L) Pain Descriptors / Indicators: Guarding;Grimacing;Moaning Pain Intervention(s): Limited activity within patient's tolerance;Monitored during session;Repositioned    Home Living Family/patient expects to be discharged to:: Skilled nursing facility                 Additional Comments: unsure, no one to report PLOF    Prior Function           Comments: unsure, no one to report PLOF      Hand Dominance        Extremity/Trunk Assessment   Upper Extremity Assessment Upper Extremity Assessment: Overall WFL for tasks assessed(able to pull on bed rail bil)    Lower Extremity Assessment Lower Extremity Assessment: RLE deficits/detail;LLE deficits/detail RLE Deficits / Details: bil LEs in blue pressure relief boots, right leg with limited R ankle ROM/strength, but limited by cognitive comprehension, pt did allow me to do knee/hip flexion ROM with grimacing and resistance on this side.   RLE: Unable to fully assess due to pain(due to cognition) LLE Deficits / Details: left leg with not quite neutral ankle DF ROM, strength testing limited by cognition.  Pt did allow me to do PROM to this side with less resistance, but still some grimacing.  Left lower leg (anterior tibia) very bruised.        Communication   Communication: Other (comment)(non sensical)  Cognition Arousal/Alertness: Awake/alert Behavior During Therapy: Anxious Overall Cognitive Status: No family/caregiver present to determine baseline cognitive functioning                                 General Comments: Pt with psych hx, but unsure of cognitive baseline.          Exercises Total Joint Exercises Ankle Circles/Pumps: PROM;Both;20 reps Heel Slides: PROM;Both;10 reps   Assessment/Plan    PT Assessment  Patient needs continued PT services  PT Problem List Decreased strength;Decreased range of motion;Decreased activity tolerance;Decreased balance;Decreased mobility;Decreased cognition;Decreased knowledge of use of DME;Obesity;Pain       PT Treatment Interventions DME instruction;Gait training;Stair training;Functional mobility training;Therapeutic activities;Therapeutic exercise;Balance training;Cognitive remediation;Patient/family education;Manual techniques;Modalities    PT Goals (Current goals can be found in the Care Plan section)  Acute Rehab PT Goals Patient Stated Goal:  unable to state PT Goal Formulation: Patient unable to participate in goal setting Time For Goal Achievement: 11/30/17 Potential to Achieve Goals: Fair    Frequency Min 3X/week           AM-PAC PT "6 Clicks" Daily Activity  Outcome Measure Difficulty turning over in bed (including adjusting bedclothes, sheets and blankets)?: Unable Difficulty moving from lying on back to sitting on the side of the bed? : Unable Difficulty sitting down on and standing up from a chair with arms (e.g., wheelchair, bedside commode, etc,.)?: Unable Help needed moving to and from a bed to chair (including a wheelchair)?: Total Help needed walking in hospital room?: Total Help needed climbing 3-5 steps with a railing? : Total 6 Click Score: 6    End of Session Equipment Utilized During Treatment: Oxygen Activity Tolerance: Patient limited by pain;Other (comment)(limited by cognition) Patient left: in bed;with call bell/phone within reach Nurse Communication: Other (comment)(RN tech that pt had a BM and needs to be cleaned.  ) PT Visit Diagnosis: History of falling (Z91.81);Muscle weakness (generalized) (M62.81);Difficulty in walking, not elsewhere classified (R26.2);Pain Pain - Right/Left: Right Pain - part of body: Leg    Time: 7253-6644 PT Time Calculation (min) (ACUTE ONLY): 14 min   Charges:        Wells Guiles B. Elia Keenum, PT, DPT [phone]    PT Evaluation $PT Eval Moderate Complexity: 1 Mod     11/16/2017, 3:45 PM

## 2017-11-16 NOTE — Progress Notes (Signed)
The patient is refusing liquid medication taken by mouth due to taste.  Can we change these med's to a pill form.  She will take pills without complaint.

## 2017-11-16 NOTE — Progress Notes (Signed)
Notified MD Rodena Piety, if pt could have alternative PO pain med. Pt has IV Dilaudid for pain at the moment. Awaiting orders.  Paulla Fore, RN

## 2017-11-17 LAB — BASIC METABOLIC PANEL
Anion gap: 8 (ref 5–15)
BUN: 9 mg/dL (ref 6–20)
CHLORIDE: 99 mmol/L — AB (ref 101–111)
CO2: 29 mmol/L (ref 22–32)
CREATININE: 0.46 mg/dL (ref 0.44–1.00)
Calcium: 8.5 mg/dL — ABNORMAL LOW (ref 8.9–10.3)
GFR calc Af Amer: >60 mL/min (ref >60)
GFR calc non Af Amer: >60 mL/min (ref >60)
GLUCOSE: 98 mg/dL (ref 65–99)
POTASSIUM: 3.7 mmol/L (ref 3.5–5.1)
SODIUM: 136 mmol/L (ref 135–145)

## 2017-11-17 LAB — CBC WITH DIFFERENTIAL/PLATELET
Abs Immature Granulocytes: 0 10*3/uL (ref 0.0–0.1)
Basophils Absolute: 0 10*3/uL (ref 0.0–0.1)
Basophils Relative: 0 %
EOS ABS: 0.1 10*3/uL (ref 0.0–0.7)
EOS PCT: 1 %
HEMATOCRIT: 25.9 % — AB (ref 36.0–46.0)
HEMOGLOBIN: 8.3 g/dL — AB (ref 12.0–15.0)
Immature Granulocytes: 1 %
LYMPHS ABS: 1.2 10*3/uL (ref 0.7–4.0)
LYMPHS PCT: 17 %
MCH: 28.8 pg (ref 26.0–34.0)
MCHC: 32 g/dL (ref 30.0–36.0)
MCV: 89.9 fL (ref 78.0–100.0)
MONO ABS: 0.7 10*3/uL (ref 0.1–1.0)
MONOS PCT: 10 %
Neutro Abs: 5.1 10*3/uL (ref 1.7–7.7)
Neutrophils Relative %: 71 %
Platelets: 287 10*3/uL (ref 150–400)
RBC: 2.88 MIL/uL — AB (ref 3.87–5.11)
RDW: 15.4 % (ref 11.5–15.5)
WBC: 7.1 10*3/uL (ref 4.0–10.5)

## 2017-11-17 NOTE — Progress Notes (Addendum)
PROGRESS NOTE    Natalie Thomas  GDJ:[id number] DOB: 1960-10-28 DOA: 11/07/2017 PCP: Patient, No Pcp Per    Brief Narrative: 57 y.o.femalewithhistory of CHF, COPD on home oxygen at 2 L nasal cannula, depression, diabetes type 2, GERD, hypertension, chronic hyponatremia baseline 131, bipolar disorder, depression, GERD, [facility], after sustaining an unwitnessed fall. The patient was found lying on the floor, unknown length. The patient endorsed right hip pain immediately after being found. She is a poor historian, and she may have some degree of confusion at this time, for which other information cannot be obtained. She manages to say "yes" or "no", denying shortness of breath, chest pain, abdominal discomfort. She does admit to be very thirsty, drinking "a lot" of water and ice chips .She had been seen in the ER on May 13 with abdominal pain felt to be secondary to constipation due to narcotics, and she had one prior admission from 4/24 through 10/19/2017 for enteritis and dyspnea, placed on Solu-Medrol,At the time her sodium was128, improved with IV fluids. At time of discharge, she was in fairly stable clinical condition. At the time, she was sent home with sodium tablets 1 g 3 times a day, unclear if she had been taking it .No new infections. No other reported falls  Right hip x-ray comminuted right intertrochanteric fracture with medial rotation of the distal femur.     Assessment & Plan:   Principal Problem:   Closed comminuted intertrochanteric fracture of right femur (HCC) Active Problems:   Chronic hyponatremia   Essential hypertension   GERD   Osteoporosis   Lumbar disc disease   Polycythemia secondary to smoking   Schizoaffective disorder, bipolar type (HCC)   Hyponatremia   Incontinence of urine   Hyperlipidemia   Chronic respiratory failure with hypoxia (HCC)   Anemia   Anxiety and depression   S/P  right hip fracture  1] right hip fracture status post fall and cephalo-medullary nailing of right intertrochanteric femur fracture patient is weightbearing as tolerated to right lower extremity.  2] chronic hyponatremia due to polydipsia stable sodium 136 today  3] history of COPD chronic hypoxic respiratory failure uses 2 L of oxygen at home/community-acquired pneumonia not on antibiotics at this time stable  4] history of diastolic CHF echo 12/14/2977 with 55 to 60% ejection fraction with normal systolic and grade 1 diastolic dysfunction.  Not on any diuretics at this time stable  5] anemia of chronic disease hemoglobin was 6.8 yesterday up to 8.3 after blood transfusion.  6] type2 diabetes not on any insulin at this time blood sugar stable  7] history of schizophrenia mood disorder on multiple psych meds Latuda, Paxil, Wellbutrin, Depakote, Desyrel and Xanax.  8] history of hypertension continue CATAPRESS.      DVT prophylaxis: Lovenox Code Status: Full code Family Communication: No family available Disposition Plan: Patient is medically stable to be discharged.  She will need skilled nursing facility placement.   Consultants: PCCM, Ortho  Procedures: Right hip cephalo-medullary nailing of the right intertrochanteric femur fracture Antimicrobials: None  Subjective: No new complaints  Objective: Vitals:   11/17/17 0531 11/17/17 0822 11/17/17 0823 11/17/17 0824  BP: (!) 158/58     Pulse: 70     Resp: 19     Temp: 98.6 F (37 C)     TempSrc: Oral     SpO2: 97% 95% 95% 95%  Weight:      Height:  Intake/Output Summary (Last 24 hours) at 11/17/2017 0916 Last data filed at 11/17/2017 2956 Gross per 24 hour  Intake 920 ml  Output 350 ml  Net 570 ml   Filed Weights   11/13/17 0442 11/14/17 0402 11/15/17 0401  Weight: 99.4 kg (219 lb 2.2 oz) 95.5 kg (210 lb 8.6 oz) 95 kg (209 lb 7 oz)    Examination:  General exam: Appears calm and comfortable    Respiratory system: Clear to auscultation. Respiratory effort normal. Cardiovascular system: S1 & S2 heard, RRR. No JVD, murmurs, rubs, gallops or clicks. No pedal edema. Gastrointestinal system: Abdomen is nondistended, soft and nontender. No organomegaly or masses felt. Normal bowel sounds heard. Central nervous system: Alert and oriented. No focal neurological deficits. Extremities: Symmetric 5 x 5 power. Skin: No rashes, lesions or ulcers Psychiatry: Judgement and insight appear normal. Mood & affect appropriate.     Data Reviewed: I have personally reviewed following labs and imaging studies  CBC: Recent Labs  Lab 11/13/17 0253 11/14/17 0355 11/15/17 0347 11/15/17 1342 11/16/17 0400 11/17/17 0625  WBC 6.0 8.4 8.3  --  8.1 7.1  NEUTROABS  --   --  5.7  --  5.8 5.1  HGB 7.5* 7.9* 6.8* 7.9* 8.2* 8.3*  HCT 23.9* 25.3* 22.0* 24.4* 25.4* 25.9*  MCV 93.0 92.3 92.1  --  88.5 89.9  PLT 194 225 245  --  260 213   Basic Metabolic Panel: Recent Labs  Lab 11/11/17 0446  11/13/17 0253 11/14/17 0355 11/15/17 0347 11/16/17 0400 11/17/17 0625  NA 132*   < > 137 136 138 135 136  K 4.7   < > 4.3 4.6 3.8 3.5 3.7  CL 96*   < > 102 100* 100* 98* 99*  CO2 27   < > 27 28 29 28 29   GLUCOSE 125*   < > 142* 239* 122* 89 98  BUN 34*   < > 26* 25* 24* 13 9  CREATININE 0.68   < > 0.45 0.51 0.43* 0.37* 0.46  CALCIUM 8.4*   < > 8.4* 8.7* 8.6* 8.6* 8.5*  MG 1.9  --   --  1.8  --   --   --   PHOS 4.1  --   --  5.1*  --   --   --    < > = values in this interval not displayed.   GFR: Estimated Creatinine Clearance: 87.8 mL/min (by C-G formula based on SCr of 0.46 mg/dL). Liver Function Tests: No results for input(s): AST, ALT, ALKPHOS, BILITOT, PROT, ALBUMIN in the last 168 hours. No results for input(s): LIPASE, AMYLASE in the last 168 hours. No results for input(s): AMMONIA in the last 168 hours. Coagulation Profile: No results for input(s): INR, PROTIME in the last 168 hours. Cardiac  Enzymes: No results for input(s): CKTOTAL, CKMB, CKMBINDEX, TROPONINI in the last 168 hours. BNP (last 3 results) No results for input(s): PROBNP in the last 8760 hours. HbA1C: No results for input(s): HGBA1C in the last 72 hours. CBG: Recent Labs  Lab 11/14/17 2337 11/15/17 0424 11/15/17 0744 11/15/17 1135 11/15/17 1546  GLUCAP 123* 115* 105* 130* 168*   Lipid Profile: No results for input(s): CHOL, HDL, LDLCALC, TRIG, CHOLHDL, LDLDIRECT in the last 72 hours. Thyroid Function Tests: No results for input(s): TSH, T4TOTAL, FREET4, T3FREE, THYROIDAB in the last 72 hours. Anemia Panel: No results for input(s): VITAMINB12, FOLATE, FERRITIN, TIBC, IRON, RETICCTPCT in the last 72 hours. Sepsis Labs: Recent Labs  Lab 11/10/17 2243  LATICACIDVEN 0.9    Recent Results (from the past 240 hour(s))  Surgical PCR screen     Status: Abnormal   Collection Time: 11/08/17  4:54 AM  Result Value Ref Range Status   MRSA, PCR NEGATIVE NEGATIVE Final   Staphylococcus aureus POSITIVE (A) NEGATIVE Final    Comment: (NOTE) The Xpert SA Assay (FDA approved for NASAL specimens in patients 3 years of age and older), is one component of a comprehensive surveillance program. It is not intended to diagnose infection nor to guide or monitor treatment. Performed at [facility], [place] [address], [place], [place] [address]   Culture, respiratory (NON-Expectorated)     Status: None   Collection Time: 11/09/17  1:28 AM  Result Value Ref Range Status   Specimen Description TRACHEAL ASPIRATE  Final   Special Requests NONE  Final   Gram Stain NO WBC SEEN NO ORGANISMS SEEN   Final   Culture   Final    RARE Consistent with normal respiratory flora. Performed at [facility], [place] [address], [place], [place] [address]    Report Status 11/11/2017 FINAL  Final  Culture, respiratory (NON-Expectorated)     Status: None   Collection Time: 11/10/17 11:51 PM  Result Value Ref Range Status    Specimen Description TRACHEAL ASPIRATE  Final   Special Requests NONE  Final   Gram Stain   Final    ABUNDANT WBC PRESENT, PREDOMINANTLY PMN RARE GRAM POSITIVE COCCI    Culture   Final    RARE Consistent with normal respiratory flora. Performed at [facility], [place] [address], [place], [place] [address]    Report Status 11/13/2017 FINAL  Final         Radiology Studies: No results found.      Scheduled Meds: . ALPRAZolam  0.5 mg Oral TID  . arformoterol  15 mcg Nebulization BID  . aspirin  81 mg Oral Daily  . budesonide (PULMICORT) nebulizer solution  0.5 mg Nebulization BID  . cloNIDine  0.1 mg Oral Daily  . docusate  100 mg Oral BID  . enoxaparin (LOVENOX) injection  30 mg Subcutaneous Q12H  . ipratropium-albuterol  3 mL Nebulization TID  . lurasidone  40 mg Oral Q breakfast  . pantoprazole  40 mg Oral Daily  . PARoxetine  20 mg Oral Daily  . sennosides  5 mL Oral QHS  . traZODone  50 mg Oral QHS  . valproic acid  250 mg Oral BID   Continuous Infusions: . sodium chloride Stopped (11/15/17 1300)     LOS: 10 days     Georgette Shell, MD [facility]  If 7PM-7AM, please contact night-coverage [email or web address] Password [facility] 11/17/2017, 9:16 AM

## 2017-11-17 NOTE — Plan of Care (Signed)
  Problem: Health Behavior/Discharge Planning: Goal: Ability to manage health-related needs will improve Outcome: Progressing   

## 2017-11-17 NOTE — [place] FL2 (Signed)
MEDICAID FL2 LEVEL OF CARE SCREENING TOOL     IDENTIFICATION  Patient Name: Natalie Thomas Birthdate: Dec 01, 1960 Sex: female Admission Date (Current Location): 11/07/2017  [facility] and [place] Number:  Herbalist and Address:  [facility] [place], [place] [address], [place], [place] [address]      Provider Number: [id number]  Attending Physician Name and Address:  Georgette Shell, MD  Relative Name and Phone Number:  Terressa Koyanagi, [phone]    Current Level of Care: Hospital Recommended Level of Care: [place] Prior Approval Number:    Date Approved/Denied:   PASRR Number:    Discharge Plan: Other (Comment)(ALF)    Current Diagnoses: Patient Active Problem List   Diagnosis Date Noted  . S/P right hip fracture 11/07/2017  . Closed comminuted intertrochanteric fracture of right femur ([place]) 11/07/2017  . Anxiety and depression   . Dyspnea 10/16/2017  . Anemia 10/16/2017  . COPD exacerbation ([place]) 10/07/2017  . Hyperlipidemia 10/07/2017  . Chronic respiratory failure with hypoxia ([place]) 10/07/2017  . Acute on chronic respiratory failure with hypoxia and hypercapnia ([place]) 10/07/2017  . Incontinence of urine 08/14/2011  . Hyponatremia 07/27/2011  . Schizoaffective disorder, bipolar type ([place]) 07/05/2011  . Cough 05/08/2011  . Encounter for long-term (current) use of other medications 04/09/2011  . Lumbar disc disease 04/09/2011  . Polycythemia secondary to smoking 04/09/2011  . HIP PAIN, LEFT 12/12/2007  . 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 DIABETES MELLITUS, TYPE II 04/08/2007  . MENOPAUSE, PREMATURE 04/08/2007  . Chronic hyponatremia 04/08/2007  . Schizoaffective disorder ([place]) 04/08/2007  . DEPRESSION 04/08/2007  . Essential hypertension 04/08/2007  . COPD 04/08/2007  . GERD 04/08/2007  . Osteoporosis 04/08/2007  . DYSURIA 04/08/2007    Orientation RESPIRATION BLADDER Height & Weight      Self  O2(Nasal Cannula 3L) Continent Weight: 209 lb 7 oz (95 kg) Height:  5\' 4"  (162.6 cm)  BEHAVIORAL SYMPTOMS/MOOD NEUROLOGICAL BOWEL NUTRITION STATUS      Incontinent Diet(DIET SOFT; Fluid restriction: 2000 mL Fluid )  AMBULATORY STATUS COMMUNICATION OF NEEDS Skin   Extensive Assist Verbally Surgical wounds(Closed incision right hip silicone dressing)                       Personal Care Assistance Level of Assistance  Bathing, Feeding, Dressing Bathing Assistance: Maximum assistance Feeding assistance: Independent Dressing Assistance: Maximum assistance     Functional Limitations Info  Sight, Hearing, Speech Sight Info: Adequate Hearing Info: Adequate Speech Info: Adequate    SPECIAL CARE FACTORS FREQUENCY                       Contractures Contractures Info: Not present    Additional Factors Info  Code Status, Allergies, Psychotropic Code Status Info: Full Code Allergies Info: Clarithromycin, Penicillins Psychotropic Info: ALPRAZolam, buPROPion, divalproex, hydrOXYzine, lurasidone         Current Medications (11/17/2017):  This is the current hospital active medication list Current Facility-Administered Medications  Medication Dose Route Frequency Provider Last Rate Last Dose  . 0.9 %  sodium chloride infusion  250 mL Intravenous PRN Kipp Brood, MD   Stopped at 11/15/17 1300  . acetaminophen (TYLENOL) solution 650 mg  650 mg Oral Q6H PRN Georgette Shell, MD      . ALPRAZolam Duanne Moron) tablet 0.5 mg  0.5 mg Oral TID Kipp Brood, MD   0.5 mg at 11/17/17 0937  . arformoterol (  BROVANA) nebulizer solution 15 mcg  15 mcg Nebulization BID Kipp Brood, MD   15 mcg at 11/17/17 7793  . aspirin chewable tablet 81 mg  81 mg Oral Daily Kipp Brood, MD   81 mg at 11/17/17 0937  . bisacodyl (DULCOLAX) suppository 10 mg  10 mg Rectal Daily PRN Kipp Brood, MD      . budesonide (PULMICORT) nebulizer solution 0.5 mg  0.5 mg Nebulization BID Kipp Brood, MD   0.5 mg at 11/17/17 9030  . cloNIDine (CATAPRES) tablet 0.1 mg  0.1 mg Oral Daily Agarwala, Einar Grad, MD   0.1 mg at 11/17/17 0937  . docusate (COLACE) 50 MG/5ML liquid 100 mg  100 mg Oral BID Kipp Brood, MD   100 mg at 11/16/17 1043  . enoxaparin (LOVENOX) injection 30 mg  30 mg Subcutaneous Q12H Kipp Brood, MD   30 mg at 11/17/17 0923  . HYDROmorphone (DILAUDID) injection 1 mg  1 mg Intravenous Q4H PRN Hammonds, Sharyn Blitz, MD   1 mg at 11/15/17 2058  . ipratropium-albuterol (DUONEB) 0.5-2.5 (3) MG/3ML nebulizer solution 3 mL  3 mL Nebulization TID Kipp Brood, MD   3 mL at 11/17/17 1428  . lurasidone (LATUDA) tablet 40 mg  40 mg Oral Q breakfast Kipp Brood, MD   40 mg at 11/17/17 0940  . ondansetron (ZOFRAN) tablet 4 mg  4 mg Oral Q6H PRN Kipp Brood, MD       Or  . ondansetron (ZOFRAN) injection 4 mg  4 mg Intravenous Q6H PRN Agarwala, Einar Grad, MD      . oxyCODONE (Oxy IR/ROXICODONE) immediate release tablet 5 mg  5 mg Oral Q6H PRN Georgette Shell, MD   5 mg at 11/16/17 2223  . pantoprazole (PROTONIX) EC tablet 40 mg  40 mg Oral Daily Kipp Brood, MD   40 mg at 11/17/17 0937  . PARoxetine (PAXIL) tablet 20 mg  20 mg Oral Daily Kipp Brood, MD   20 mg at 11/17/17 0937  . polyethylene glycol (MIRALAX / GLYCOLAX) packet 17 g  17 g Oral BID PRN Kipp Brood, MD      . sennosides (SENOKOT) 8.8 MG/5ML syrup 5 mL  5 mL Oral QHS Kipp Brood, MD   5 mL at 11/14/17 2110  . traZODone (DESYREL) tablet 50 mg  50 mg Oral QHS Agarwala, Einar Grad, MD   50 mg at 11/16/17 2222  . valproic acid (DEPAKENE) 250 MG capsule 250 mg  250 mg Oral BID Georgette Shell, MD   250 mg at 11/17/17 3007     Discharge Medications: Please see discharge summary for a list of discharge medications.  Relevant Imaging Results:  Relevant Lab Results:   Additional Information SS#: [id number]  Eileen Stanford, LCSW

## 2017-11-18 LAB — CBC WITH DIFFERENTIAL/PLATELET
Abs Immature Granulocytes: 0.1 10*3/uL (ref 0.0–0.1)
Basophils Absolute: 0 10*3/uL (ref 0.0–0.1)
Basophils Relative: 0 %
EOS ABS: 0.1 10*3/uL (ref 0.0–0.7)
EOS PCT: 1 %
HEMATOCRIT: 25.2 % — AB (ref 36.0–46.0)
Hemoglobin: 8.1 g/dL — ABNORMAL LOW (ref 12.0–15.0)
Immature Granulocytes: 1 %
LYMPHS ABS: 1.5 10*3/uL (ref 0.7–4.0)
Lymphocytes Relative: 18 %
MCH: 29.1 pg (ref 26.0–34.0)
MCHC: 32.1 g/dL (ref 30.0–36.0)
MCV: 90.6 fL (ref 78.0–100.0)
MONO ABS: 0.7 10*3/uL (ref 0.1–1.0)
Monocytes Relative: 8 %
Neutro Abs: 6.2 10*3/uL (ref 1.7–7.7)
Neutrophils Relative %: 72 %
Platelets: 307 10*3/uL (ref 150–400)
RBC: 2.78 MIL/uL — AB (ref 3.87–5.11)
RDW: 15.4 % (ref 11.5–15.5)
WBC: 8.5 10*3/uL (ref 4.0–10.5)

## 2017-11-18 LAB — BASIC METABOLIC PANEL
Anion gap: 6 (ref 5–15)
BUN: 8 mg/dL (ref 6–20)
CHLORIDE: 100 mmol/L — AB (ref 101–111)
CO2: 28 mmol/L (ref 22–32)
CREATININE: 0.32 mg/dL — AB (ref 0.44–1.00)
Calcium: 8.4 mg/dL — ABNORMAL LOW (ref 8.9–10.3)
GFR calc Af Amer: >60 mL/min (ref >60)
GFR calc non Af Amer: >60 mL/min (ref >60)
Glucose, Bld: 102 mg/dL — ABNORMAL HIGH (ref 65–99)
Potassium: 3.3 mmol/L — ABNORMAL LOW (ref 3.5–5.1)
SODIUM: 134 mmol/L — AB (ref 135–145)

## 2017-11-18 MED ORDER — POTASSIUM CHLORIDE CRYS ER 20 MEQ PO TBCR
40.0000 meq | EXTENDED_RELEASE_TABLET | Freq: Once | ORAL | Status: AC
  Administered 2017-11-18: 40 meq via ORAL
  Filled 2017-11-18: qty 2

## 2017-11-18 MED ORDER — OXYCODONE HCL 5 MG PO TABS: 5.0000 mg | ORAL_TABLET | Freq: Four times a day (QID) | ORAL | 0 refills | Status: AC | PRN

## 2017-11-18 MED ORDER — ALUM & MAG HYDROXIDE-SIMETH 200-200-20 MG/5ML PO SUSP
30.0000 mL | ORAL | Status: DC | PRN
  Administered 2017-11-18 – 2017-11-19 (×2): 30 mL via ORAL
  Filled 2017-11-18 (×2): qty 30

## 2017-11-18 MED ORDER — CARVEDILOL 25 MG PO TABS
25.0000 mg | ORAL_TABLET | Freq: Two times a day (BID) | ORAL | Status: DC
  Administered 2017-11-18 – 2017-11-20 (×4): 25 mg via ORAL
  Filled 2017-11-18 (×4): qty 1

## 2017-11-18 MED ORDER — ENOXAPARIN SODIUM 40 MG/0.4ML ~~LOC~~ SOLN: 40.0000 mg | SUBCUTANEOUS | 0 refills | Status: DC

## 2017-11-18 MED ORDER — ALPRAZOLAM 0.5 MG PO TABS: 0.5000 mg | ORAL_TABLET | Freq: Three times a day (TID) | ORAL | 0 refills | Status: DC

## 2017-11-18 NOTE — Discharge Summary (Addendum)
Physician Discharge Summary  BRYLEIGH OTTAWAY PZW:[id number] DOB: [birth year] DOA: 11/07/2017  PCP: Patient, No Pcp Per  Admit date: 11/07/2017 Discharge date: 11/20/2017 Admitted From nursing home  Disposition:  Nursing home  Recommendations for Outpatient Follow-up:  1. Follow up with PCP at the nursing home. 2. Please obtain BMP/CBC in one week 3. Follow up with ortho  Home Health none Equipment/Devices:none  Discharge Condition: stable CODE STATUS full Diet recommendation:cardiac Brief/Interim Summary:57 y.o.femalewithhistory of CHF, COPD on home oxygen at 2 L nasal cannula, depression, diabetes type 2, GERD, hypertension, chronic hyponatremia baseline 131, bipolar disorder, depression, GERD, [facility], after sustaining an unwitnessed fall. The patient was found lying on the floor, unknown length. The patient endorsed right hip pain immediately after being found. She is a poor historian, and she may have some degree of confusion at this time, for which other information cannot be obtained. She manages to say "yes" or "no", denying shortness of breath, chest pain, abdominal discomfort. She does admit to be very thirsty, drinking "a lot" of water and ice chips .She had been seen in the ER on May 13 with abdominal pain felt to be secondary to constipation due to narcotics, and she had one prior admission from 4/24 through 10/19/2017 for enteritis and dyspnea, placed on Solu-Medrol,At the time her sodium was128, improved with IV fluids. At time of discharge, she was in fairly stable clinical condition. At the time, she was sent home with sodium tablets 1 g 3 times a day, unclear if she had been taking it .No new infections. No other reported falls  Right hip x-ray comminuted right intertrochanteric fracture with medial rotation of the distal femur.      Discharge Diagnoses:  Principal Problem:   Closed comminuted  intertrochanteric fracture of right femur (HCC) Active Problems:   Chronic hyponatremia   Essential hypertension   GERD   Osteoporosis   Lumbar disc disease   Polycythemia secondary to smoking   Schizoaffective disorder, bipolar type (HCC)   Hyponatremia   Incontinence of urine   Hyperlipidemia   Chronic respiratory failure with hypoxia (HCC)   Anemia   Anxiety and depression   S/P right hip fracture    1]right hip fracture status post falland cephalo-medullary nailing of right intertrochanteric femur fracture patient is weightbearing as tolerated to right lower extremity.  2]chronic hyponatremia due to polydipsia stable sodium 136 today.continue na tabs.  3]history of COPD chronic hypoxic respiratory failure uses 2 L of oxygen at home/community-acquired pneumonia not on antibiotics at this time stable  4]history of diastolic CHF echo 10/16/5359 with 55 to 60% ejection fraction with normal systolic and grade 1 diastolic dysfunction.  Not on any diuretics at this time stable  5]anemia of chronic disease hemoglobin was 6.8 yesterday up to 8.3 after blood transfusion.  6] type2 diabetes not on any insulin at this time blood sugar stable  7]history of schizophrenia mood disorder on multiple psych meds Latuda, Paxil, Wellbutrin, Depakote, Desyrel and Xanax.  8]history of hypertension continue CATAPRESS,nifedepine,carvidilol.  9]foley care she still has the foley in place.consider dc once she is up and around and able to walk to the bedside commode or rest room.     Discharge Instructions   Allergies as of 11/18/2017      Reactions   Clarithromycin Itching   Penicillins Itching   Has patient had a PCN reaction causing immediate rash, facial/tongue/throat swelling, SOB or lightheadedness with hypotension: No Has  patient had a PCN reaction causing severe rash involving mucus membranes or skin necrosis: No Has patient had a PCN reaction that required  hospitalization: Unknown Has patient had a PCN reaction occurring within the last 10 years: No If all of the above answers are "NO", then may proceed with Cephalosporin use.      Medication List    STOP taking these medications   magnesium hydroxide 400 MG/5ML suspension Commonly known as:  MILK OF MAGNESIA   neomycin-bacitracin-polymyxin Oint Commonly known as:  NEOSPORIN   sodium chloride 1 g tablet     TAKE these medications   acetaminophen 325 MG tablet Commonly known as:  TYLENOL Take 650 mg by mouth every 6 (six) hours as needed for mild pain.   albuterol 108 (90 Base) MCG/ACT inhaler Commonly known as:  PROVENTIL HFA;VENTOLIN HFA Inhale 2 puffs into the lungs every 4 (four) hours as needed for wheezing or shortness of breath.   ALPRAZolam 0.5 MG tablet Commonly known as:  XANAX Take 1 tablet (0.5 mg total) by mouth 3 (three) times daily.   aspirin 81 MG tablet Take 1 tablet (81 mg total) by mouth daily. For heart health   BIOFREEZE COLORLESS EX Apply 1 application topically 3 (three) times daily. TO UPPER ABDOMEN   BREO ELLIPTA 100-25 MCG/INH Aepb Generic drug:  fluticasone furoate-vilanterol Inhale 1 puff into the lungs daily.   buPROPion 300 MG 24 hr tablet Commonly known as:  WELLBUTRIN XL Take 1 tablet (300 mg total) by mouth every morning. What changed:  when to take this   carvedilol 25 MG tablet Commonly known as:  COREG Take 25 mg by mouth 2 (two) times daily with a meal.   cloNIDine 0.3 mg/24hr patch Commonly known as:  CATAPRES - Dosed in mg/24 hr Place 0.3 mg onto the skin once a week. On Monday   DAILY-VITE PO Take 1 tablet by mouth daily.   dextromethorphan-guaiFENesin 30-600 MG 12hr tablet Commonly known as:  MUCINEX DM Take 1 tablet by mouth every 12 (twelve) hours as needed for cough.   dicyclomine 20 MG tablet Commonly known as:  BENTYL Take 1 tablet (20 mg total) by mouth 2 (two) times daily.   divalproex 250 MG DR  tablet Commonly known as:  DEPAKOTE Take 1 tablet (250 mg total) by mouth every 12 (twelve) hours.   ferrous sulfate 325 (65 FE) MG tablet Take 325 mg by mouth daily with breakfast.   fluticasone 50 MCG/ACT nasal spray Commonly known as:  FLONASE Place 2 sprays into both nostrils 2 (two) times daily.   guaifenesin 100 MG/5ML syrup Commonly known as:  ROBITUSSIN Take 200 mg by mouth 3 (three) times daily as needed for cough.   hydrALAZINE 100 MG tablet Commonly known as:  APRESOLINE Take 100 mg by mouth every 8 (eight) hours.   hydrOXYzine 25 MG tablet Commonly known as:  ATARAX/VISTARIL Take 1 tablet (25 mg total) by mouth 3 (three) times daily as needed for anxiety.   ipratropium-albuterol 0.5-2.5 (3) MG/3ML Soln Commonly known as:  DUONEB Take 3 mLs by nebulization 3 (three) times daily as needed (SOb).   lurasidone 80 MG Tabs tablet Commonly known as:  LATUDA Take 1 tablet (80 mg total) by mouth daily with breakfast. What changed:  how much to take   magnesium oxide 400 (241.3 Mg) MG tablet Commonly known as:  MAG-OX Take 2 tablets (800 mg total) by mouth 2 (two) times daily.   Melatonin 10 MG Tabs Take  10 mg by mouth at bedtime.   Methylcellulose (Laxative) 500 MG Tabs Take 500 mg by mouth at bedtime.   Washburn 200-200-20 MG/5ML suspension Generic drug:  alum & mag hydroxide-simeth Take 30 mLs by mouth every 6 (six) hours as needed for indigestion or heartburn.   NIFEdipine 90 MG 24 hr tablet Commonly known as:  PROCARDIA XL/ADALAT-CC Take 90 mg by mouth daily.   omeprazole 20 MG capsule Commonly known as:  PRILOSEC Take 1 capsule (20 mg total) by mouth 2 (two) times daily before a meal. For acid reflux.   oxyCODONE 5 MG immediate release tablet Commonly known as:  Oxy IR/ROXICODONE Take 1 tablet (5 mg total) by mouth every 6 (six) hours as needed for up to 7 days for severe pain.   PARoxetine 20 MG tablet Commonly known as:  PAXIL Take 1 tablet (20 mg  total) by mouth daily.   polyethylene glycol packet Commonly known as:  MIRALAX Take 17 g by mouth daily as needed.   pravastatin 20 MG tablet Commonly known as:  PRAVACHOL Take 20 mg by mouth daily.   sucralfate 1 g tablet Commonly known as:  CARAFATE Take 1 g by mouth 4 (four) times daily.   traZODone 50 MG tablet Commonly known as:  DESYREL Take 50 mg by mouth at bedtime.       Allergies  Allergen Reactions  . Clarithromycin Itching  . Penicillins Itching    Has patient had a PCN reaction causing immediate rash, facial/tongue/throat swelling, SOB or lightheadedness with hypotension: No Has patient had a PCN reaction causing severe rash involving mucus membranes or skin necrosis: No Has patient had a PCN reaction that required hospitalization: Unknown Has patient had a PCN reaction occurring within the last 10 years: No If all of the above answers are "NO", then may proceed with Cephalosporin use.     Consultations:  ortho   Procedures/Studies: Dg Chest 1 View  Result Date: 11/07/2017 CLINICAL DATA:  Status post fall, with concern for chest injury. Initial encounter. EXAM: CHEST  1 VIEW COMPARISON:  Chest radiograph performed 11/04/2017 FINDINGS: The lungs are well-aerated. Mild vascular congestion is noted. Peribronchial thickening is seen. Chronically increased interstitial markings are noted. There is no evidence of pleural effusion or pneumothorax. The cardiomediastinal silhouette is within normal limits. No acute osseous abnormalities are seen. IMPRESSION: Mild vascular congestion noted. Peribronchial thickening seen. Chronically increased interstitial markings noted. No displaced rib fractures identified. Electronically Signed   By: Garald Balding M.D.   On: 11/07/2017 06:47   Dg Chest 2 View  Result Date: 11/04/2017 CLINICAL DATA:  Abdominal pain for a month, difficulty breathing, wheezing. EXAM: CHEST - 2 VIEW COMPARISON:  Chest x-ray dated 10/16/2017. Chest  x-ray dated 06/22/2012. FINDINGS: Stable cardiomegaly. Lungs are clear. No pleural effusion or pneumothorax seen. Osseous structures about the chest are unremarkable. IMPRESSION: No active cardiopulmonary disease. No evidence of pneumonia or pulmonary edema. Electronically Signed   By: Franki Cabot M.D.   On: 11/04/2017 20:30   Ct Angio Chest Pe W Or Wo Contrast  Result Date: 11/09/2017 CLINICAL DATA:  Shortness of breath and hypoxia. Preoperative for femur fracture repair. EXAM: CT ANGIOGRAPHY CHEST WITH CONTRAST TECHNIQUE: Multidetector CT imaging of the chest was performed using the standard protocol during bolus administration of intravenous contrast. Multiplanar CT image reconstructions and MIPs were obtained to evaluate the vascular anatomy. CONTRAST:  170mL ISOVUE-370 IOPAMIDOL (ISOVUE-370) INJECTION 76% COMPARISON:  Chest radiograph performed earlier today at 9:58 p.m., and  CTA of the chest performed 11/19/2011 FINDINGS: Cardiovascular:  There is no evidence of pulmonary embolus. The heart is borderline normal in size. Scattered coronary artery calcifications are seen. Scattered calcification is noted along the aortic arch and proximal great vessels, with moderate to severe luminal narrowing at the origin of the left subclavian artery secondary to calcification. Mediastinum/Nodes: A small pericardial effusion is noted. No mediastinal lymphadenopathy is seen. The thyroid gland is unremarkable. No axillary lymphadenopathy is seen. Lungs/Pleura: There is near-complete opacification of the left mainstem bronchus and bronchioles, reflecting aspiration. There is near complete consolidation of the left lower lobe, reflecting aspiration pneumonia. Mild airspace opacity is noted at the right middle lobe. No significant pleural effusion or pneumothorax is seen. Blebs are noted at the left lung apex. Upper Abdomen: The visualized portions of the liver and spleen are unremarkable. Musculoskeletal: No acute osseous  abnormalities are identified. There is mild chronic loss of height at the mid to lower thoracic spine. Endplate irregularity at the midthoracic spine is nonspecific. The visualized musculature is unremarkable in appearance. Review of the MIP images confirms the above findings. IMPRESSION: 1. No evidence of pulmonary embolus. 2. Near complete opacification of the left mainstem bronchus and bronchioles, and dense consolidation of the left lower lobe, reflecting aspiration pneumonia. 3. Mild airspace opacity at the right middle lobe. 4. Scattered coronary artery calcifications seen. 5. Small pericardial effusion noted. 6. Scattered calcification along the aortic arch and proximal great vessels, with moderate to severe luminal narrowing at the origin of the left subclavian artery secondary to calcification. 7. Nonspecific endplate irregularity of the midthoracic spine. Would correlate for any associated symptoms. MRI could be considered for further evaluation, if the patient has significant mid back pain. Electronically Signed   By: Garald Balding M.D.   On: 11/09/2017 01:20   Dg Chest Port 1 View  Result Date: 11/14/2017 CLINICAL DATA:  Respiratory failure. EXAM: PORTABLE CHEST 1 VIEW COMPARISON:  Radiograph of Nov 11, 2017. FINDINGS: Stable cardiomegaly. Atherosclerosis of thoracic aorta is noted. No pneumothorax is noted. Nasogastric tube is seen entering stomach. Endotracheal tube is seen projected over tracheal air shadow with distal tip 1 cm above the carina. Withdrawal by 2-3 cm is recommended. Increased bibasilar edema or atelectasis is noted with associated pleural effusions. Bony thorax is unremarkable. IMPRESSION: Increased bibasilar opacities are noted concerning for edema or atelectasis with associated pleural effusions. Endotracheal tube tip is 1 cm above the carina; withdrawal by 2-3 cm is recommended. Aortic Atherosclerosis (ICD10-I70.0). Electronically Signed   By: Marijo Conception, M.D.   On:  11/14/2017 09:06   Dg Chest Port 1 View  Result Date: 11/11/2017 CLINICAL DATA:  History of endotracheal tube EXAM: PORTABLE CHEST 1 VIEW COMPARISON:  Yesterday FINDINGS: Endotracheal tube tip 2 cm above the carina. An orogastric tube reaches the stomach. Tubing over the left chest is attributed to nasogastric tube external to the patient. There is artifact from EKG leads. Improved aeration on the left, but still complete opacification of the lower lobe. No Kerley lines or pneumothorax. IMPRESSION: 1. Stable positioning of endotracheal and orogastric tubes. 2. Improved aeration on the left, but persistent lower lobe collapse. Electronically Signed   By: Monte Fantasia M.D.   On: 11/11/2017 07:07   Dg Chest Port 1 View  Result Date: 11/10/2017 CLINICAL DATA:  Oxygen desaturation EXAM: PORTABLE CHEST 1 VIEW COMPARISON:  11/10/2017, 11/09/2017, CT 11/08/2017 FINDINGS: Endotracheal tube tip is about 1 cm superior to the carina. Esophageal tube tip  is below the diaphragm but non included. Mild mediastinal shift to the left. Persistent dense left lung base consolidation and probable small left effusion. Aortic atherosclerosis. Cardiomegaly with vascular congestion. No pneumothorax. IMPRESSION: 1. Endotracheal tube tip about 1 cm superior to carina 2. Increased hazy opacity of left thorax, possible layering effusion. Cardiomegaly with dense left lung base consolidation as before. Electronically Signed   By: Donavan Foil M.D.   On: 11/10/2017 22:28   Dg Chest Port 1 View  Result Date: 11/10/2017 CLINICAL DATA:  Assess endotracheal tube position. EXAM: PORTABLE CHEST 1 VIEW COMPARISON:  Chest radiograph performed 11/09/2017 FINDINGS: The patient's endotracheal tube is seen ending 1-2 cm above the carina. An enteric tube is noted extending below the diaphragm. A small left pleural effusion is noted. Retrocardiac airspace opacification may reflect pneumonia, mostly new from the recent prior study. Underlying  vascular congestion is noted. No pneumothorax is seen. The cardiomediastinal silhouette is borderline normal in size. No acute osseous abnormalities are identified. IMPRESSION: 1. Endotracheal tube seen ending 1-2 cm above the carina. 2. Small left pleural effusion noted. Retrocardiac airspace opacification may reflect pneumonia, mostly new from the recent prior study. 3. Underlying vascular congestion noted. Electronically Signed   By: Garald Balding M.D.   On: 11/10/2017 05:38   Dg Chest Port 1 View  Result Date: 11/09/2017 CLINICAL DATA:  Endotracheal and orogastric tube placement. EXAM: PORTABLE CHEST 1 VIEW COMPARISON:  Chest radiograph and CTA of the chest performed 11/08/2017 FINDINGS: The patient's endotracheal tube is seen ending 2-3 cm above the carina. An enteric tube is noted extending below the diaphragm. The lungs are well-aerated. Mild left basilar airspace opacity may reflect atelectasis or pneumonia, improved from the prior study. The previously noted left-sided pleural effusion is less apparent. There is no evidence of pneumothorax. The cardiomediastinal silhouette is within normal limits. No acute osseous abnormalities are seen. IMPRESSION: 1. Endotracheal tube seen ending 2-3 cm above the carina. 2. Enteric tube noted extending below the diaphragm. 3. Mild left basilar airspace opacity may reflect atelectasis or pneumonia, improved from the prior study. Electronically Signed   By: Garald Balding M.D.   On: 11/09/2017 01:22   Dg Chest Port 1 View  Result Date: 11/08/2017 CLINICAL DATA:  Acute onset of respiratory failure. Hypoxia. EXAM: PORTABLE CHEST 1 VIEW COMPARISON:  Chest radiograph performed earlier today at 6:24 a.m. FINDINGS: A small to moderate left-sided pleural effusion is noted, with left basilar airspace opacification. This may reflect asymmetric pulmonary edema or pneumonia, worsened from the prior study. Minimal right basilar atelectasis is noted. There is no evidence of  pneumothorax. The cardiomediastinal silhouette is borderline normal in size. No acute osseous abnormalities are seen. IMPRESSION: Small to moderate left-sided pleural effusion, with left basilar airspace opacification. This may reflect asymmetric pulmonary edema or pneumonia, worsened from the prior study. Minimal right basilar atelectasis noted. Electronically Signed   By: Garald Balding M.D.   On: 11/08/2017 22:15   Dg Chest Port 1 View  Result Date: 11/08/2017 CLINICAL DATA:  Acute onset of respiratory distress. EXAM: PORTABLE CHEST 1 VIEW COMPARISON:  Chest radiograph performed 11/07/2017 FINDINGS: Lung expansion is mildly decreased. Patchy left-sided airspace opacity may reflect atelectasis or pneumonia. The right lung remains relatively clear. Mild vascular congestion is noted. No pleural effusion or pneumothorax is seen. The cardiomediastinal silhouette is borderline normal in size. No acute osseous abnormalities are identified. IMPRESSION: Patchy left-sided airspace opacity may reflect atelectasis or pneumonia. Mild vascular congestion noted. Electronically Signed  By: Garald Balding M.D.   On: 11/08/2017 06:34   Dg Abd 2 Views  Result Date: 11/04/2017 CLINICAL DATA:  Abdominal pain EXAM: ABDOMEN - 2 VIEW COMPARISON:  10/16/2017, CT 10/06/2017 FINDINGS: Visible lung bases are clear. Surgical clips in the right upper quadrant. No definite free air on decubitus view. Mild gas-filled small and large bowel without obstructive pattern. Prior right superior and inferior pubic rami fractures. Dense sclerosis in the sacrum. IMPRESSION: 1. Nonobstructed bowel-gas pattern 2. Subacute appearing right superior and inferior pubic rami fractures. Electronically Signed   By: Donavan Foil M.D.   On: 11/04/2017 22:19   Dg C-arm 1-60 Min  Result Date: 11/13/2017 CLINICAL DATA:  Right femur fracture, IM nail EXAM: DG C-ARM 61-120 MIN; OPERATIVE RIGHT HIP WITH PELVIS COMPARISON:  11/07/2016 FINDINGS: Multiple  intraoperative spot images demonstrate placement of IM nail and dynamic hip screw across the right femoral intertrochanteric fracture. Anatomic alignment. No hardware complicating feature. IMPRESSION: Internal fixation across the right femoral intertrochanteric fracture. No complicating feature. Electronically Signed   By: Rolm Baptise M.D.   On: 11/13/2017 20:19   Dg Hip Operative Unilat With Pelvis Right  Result Date: 11/13/2017 CLINICAL DATA:  Right femur fracture, IM nail EXAM: DG C-ARM 61-120 MIN; OPERATIVE RIGHT HIP WITH PELVIS COMPARISON:  11/07/2016 FINDINGS: Multiple intraoperative spot images demonstrate placement of IM nail and dynamic hip screw across the right femoral intertrochanteric fracture. Anatomic alignment. No hardware complicating feature. IMPRESSION: Internal fixation across the right femoral intertrochanteric fracture. No complicating feature. Electronically Signed   By: Rolm Baptise M.D.   On: 11/13/2017 20:19   Dg Hip Unilat With Pelvis 2-3 Views Right  Result Date: 11/14/2017 CLINICAL DATA:  ORIF. EXAM: DG HIP (WITH OR WITHOUT PELVIS) 2-3V RIGHT COMPARISON:  11/13/2017. FINDINGS: Prior ORIF proximal right femur/hip. Hardware intact. Anatomic alignment. Fractures of the right superior inferior pubic rami are again noted. Postsurgical changes again noted in the sacrum bilaterally. IMPRESSION: ORIF proximal right femur/hip with anatomic alignment. Hardware intact. Electronically Signed   By: Marcello Moores  Register   On: 11/14/2017 09:01   Dg Hip Unilat  With Pelvis 2-3 Views Right  Result Date: 11/07/2017 CLINICAL DATA:  Status post fall, with right hip pain, acute onset. Initial encounter. EXAM: DG HIP (WITH OR WITHOUT PELVIS) 2-3V RIGHT COMPARISON:  Right hip radiographs performed 07/31/2017 FINDINGS: There is a comminuted right femoral intertrochanteric fracture, with mild medial rotation of the distal femur. The right femoral head remains seated at the acetabulum. The left hip joint  is unremarkable. There appears to be healing chronic fractures of the right superior and inferior pubic rami. Postoperative changes are noted at the sacrum bilaterally. The visualized bowel gas pattern is grossly unremarkable. IMPRESSION: 1. Comminuted right femoral intertrochanteric fracture, with mild medial rotation of the distal femur. 2. Healing chronic fractures of the right superior and inferior pubic rami. Electronically Signed   By: Garald Balding M.D.   On: 11/07/2017 06:46    (Echo, Carotid, EGD, Colonoscopy, ERCP)    Subjective:   Discharge Exam: Vitals:   11/17/17 2124 11/18/17 0528  BP: (!) 176/47 (!) 166/67  Pulse: 69 66  Resp:    Temp: 98.8 F (37.1 C)   SpO2: 96% 97%   Vitals:   11/17/17 1428 11/17/17 2106 11/17/17 2124 11/18/17 0528  BP:   (!) 176/47 (!) 166/67  Pulse:   69 66  Resp:      Temp:   98.8 F (37.1 C)   TempSrc:  Oral   SpO2: 96% 95% 96% 97%  Weight:      Height:        General: Pt is alert, awake, not in acute distress Cardiovascular: RRR, S1/S2 +, no rubs, no gallops Respiratory: CTA bilaterally, no wheezing, no rhonchi Abdominal: Soft, NT, ND, bowel sounds + Extremities: no edema, no cyanosis    The results of significant diagnostics from this hospitalization (including imaging, microbiology, ancillary and laboratory) are listed below for reference.     Microbiology: Recent Results (from the past 240 hour(s))  Culture, respiratory (NON-Expectorated)     Status: None   Collection Time: 11/09/17  1:28 AM  Result Value Ref Range Status   Specimen Description TRACHEAL ASPIRATE  Final   Special Requests NONE  Final   Gram Stain NO WBC SEEN NO ORGANISMS SEEN   Final   Culture   Final    RARE Consistent with normal respiratory flora. Performed at [facility], [place] [address] [place], [place] [address]    Report Status 11/11/2017 FINAL  Final  Culture, respiratory (NON-Expectorated)     Status: None   Collection Time:  11/10/17 11:51 PM  Result Value Ref Range Status   Specimen Description TRACHEAL ASPIRATE  Final   Special Requests NONE  Final   Gram Stain   Final    ABUNDANT WBC PRESENT, PREDOMINANTLY PMN RARE GRAM POSITIVE COCCI    Culture   Final    RARE Consistent with normal respiratory flora. Performed at [facility], [place] [address], [place], [place] [address]    Report Status 11/13/2017 FINAL  Final     Labs: BNP (last 3 results) Recent Labs    11/07/17 0850 11/08/17 2257 11/10/17 2243  BNP 206.8* 48.5 63.1   Basic Metabolic Panel: Recent Labs  Lab 11/14/17 0355 11/15/17 0347 11/16/17 0400 11/17/17 0625 11/18/17 0337  NA 136 138 135 136 134*  K 4.6 3.8 3.5 3.7 3.3*  CL 100* 100* 98* 99* 100*  CO2 28 29 28 29 28   GLUCOSE 239* 122* 89 98 102*  BUN 25* 24* 13 9 8   CREATININE 0.51 0.43* 0.37* 0.46 0.32*  CALCIUM 8.7* 8.6* 8.6* 8.5* 8.4*  MG 1.8  --   --   --   --   PHOS 5.1*  --   --   --   --    Liver Function Tests: No results for input(s): AST, ALT, ALKPHOS, BILITOT, PROT, ALBUMIN in the last 168 hours. No results for input(s): LIPASE, AMYLASE in the last 168 hours. No results for input(s): AMMONIA in the last 168 hours. CBC: Recent Labs  Lab 11/14/17 0355 11/15/17 0347 11/15/17 1342 11/16/17 0400 11/17/17 0625 11/18/17 0337  WBC 8.4 8.3  --  8.1 7.1 8.5  NEUTROABS  --  5.7  --  5.8 5.1 6.2  HGB 7.9* 6.8* 7.9* 8.2* 8.3* 8.1*  HCT 25.3* 22.0* 24.4* 25.4* 25.9* 25.2*  MCV 92.3 92.1  --  88.5 89.9 90.6  PLT 225 245  --  260 287 307   Cardiac Enzymes: No results for input(s): CKTOTAL, CKMB, CKMBINDEX, TROPONINI in the last 168 hours. BNP: Invalid input(s): POCBNP CBG: Recent Labs  Lab 11/14/17 2337 11/15/17 0424 11/15/17 0744 11/15/17 1135 11/15/17 1546  GLUCAP 123* 115* 105* 130* 168*   D-Dimer No results for input(s): DDIMER in the last 72 hours. Hgb A1c No results for input(s): HGBA1C in the last 72 hours. Lipid Profile No results for  input(s): CHOL,  HDL, LDLCALC, TRIG, CHOLHDL, LDLDIRECT in the last 72 hours. Thyroid function studies No results for input(s): TSH, T4TOTAL, T3FREE, THYROIDAB in the last 72 hours.  Invalid input(s): FREET3 Anemia work up No results for input(s): VITAMINB12, FOLATE, FERRITIN, TIBC, IRON, RETICCTPCT in the last 72 hours. Urinalysis    Component Value Date/Time   COLORURINE STRAW (A) 11/04/2017 2024   APPEARANCEUR CLEAR 11/04/2017 2024   APPEARANCEUR Clear 06/22/2012 0618   LABSPEC 1.002 (L) 11/04/2017 2024   LABSPEC 1.003 06/22/2012 0618   PHURINE 7.0 11/04/2017 2024   GLUCOSEU NEGATIVE 11/04/2017 2024   GLUCOSEU Negative 06/22/2012 0618   GLUCOSEU NEGATIVE 04/09/2011 1058   HGBUR NEGATIVE 11/04/2017 2024   BILIRUBINUR NEGATIVE 11/04/2017 2024   BILIRUBINUR Negative 06/22/2012 0618   KETONESUR NEGATIVE 11/04/2017 2024   PROTEINUR NEGATIVE 11/04/2017 2024   UROBILINOGEN 1.0 08/18/2011 1805   NITRITE NEGATIVE 11/04/2017 2024   LEUKOCYTESUR NEGATIVE 11/04/2017 2024   LEUKOCYTESUR Trace 06/22/2012 0618   Sepsis Labs Invalid input(s): PROCALCITONIN,  WBC,  LACTICIDVEN Microbiology Recent Results (from the past 240 hour(s))  Culture, respiratory (NON-Expectorated)     Status: None   Collection Time: 11/09/17  1:28 AM  Result Value Ref Range Status   Specimen Description TRACHEAL ASPIRATE  Final   Special Requests NONE  Final   Gram Stain NO WBC SEEN NO ORGANISMS SEEN   Final   Culture   Final    RARE Consistent with normal respiratory flora. Performed at [facility], [place] [address] [place], [place] [address]    Report Status 11/11/2017 FINAL  Final  Culture, respiratory (NON-Expectorated)     Status: None   Collection Time: 11/10/17 11:51 PM  Result Value Ref Range Status   Specimen Description TRACHEAL ASPIRATE  Final   Special Requests NONE  Final   Gram Stain   Final    ABUNDANT WBC PRESENT, PREDOMINANTLY PMN RARE GRAM POSITIVE COCCI    Culture   Final     RARE Consistent with normal respiratory flora. Performed at [facility], [place] [address] [place], [place] [address]    Report Status 11/13/2017 FINAL  Final     Time coordinating discharge: 52minutes  SIGNED:   Georgette Shell, MD  [facility] 11/18/2017, 8:11 AM Pager   If 7PM-7AM, please contact night-coverage [email or web address] Password [email or web address]

## 2017-11-18 NOTE — Progress Notes (Signed)
PROGRESS NOTE    Natalie Thomas  JOA:[id number] DOB: 20-Nov-1960 DOA: 11/07/2017 PCP: Patient, No Pcp Per  Brief Narrative: 57 y.o.femalewithhistory of CHF, COPD on home oxygen at 2 L nasal cannula, depression, diabetes type 2, GERD, hypertension, chronic hyponatremia baseline 131, bipolar disorder, depression, GERD, [facility], after sustaining an unwitnessed fall. The patient was found lying on the floor, unknown length. The patient endorsed right hip pain immediately after being found. She is a poor historian, and she may have some degree of confusion at this time, for which other information cannot be obtained. She manages to say "yes" or "no", denying shortness of breath, chest pain, abdominal discomfort. She does admit to be very thirsty, drinking "a lot" of water and ice chips .She had been seen in the ER on May 13 with abdominal pain felt to be secondary to constipation due to narcotics, and she had one prior admission from 4/24 through 10/19/2017 for enteritis and dyspnea, placed on Solu-Medrol,At the time her sodium was128, improved with IV fluids. At time of discharge, she was in fairly stable clinical condition. At the time, she was sent home with sodium tablets 1 g 3 times a day, unclear if she had been taking it .No new infections. No other reported falls  Right hip x-ray comminuted right intertrochanteric fracture with medial rotation of the distal femur.     Assessment & Plan:   Principal Problem:   Closed comminuted intertrochanteric fracture of right femur (HCC) Active Problems:   Chronic hyponatremia   Essential hypertension   GERD   Osteoporosis   Lumbar disc disease   Polycythemia secondary to smoking   Schizoaffective disorder, bipolar type (HCC)   Hyponatremia   Incontinence of urine   Hyperlipidemia   Chronic respiratory failure with hypoxia (HCC)   Anemia   Anxiety and depression   S/P  right hip fracture ]right hip fracture status post falland cephalo-medullary nailing of right intertrochanteric femur fracture patient is weightbearing as tolerated to right lower extremity.  2]chronic hyponatremia due to polydipsia stable sodium 136 today  3]history of COPD chronic hypoxic respiratory failure uses 2 L of oxygen at home/community-acquired pneumonia not on antibiotics at this time stable  4]history of diastolic CHF echo 11/24/930 with 55 to 60% ejection fraction with normal systolic and grade 1 diastolic dysfunction.  Not on any diuretics at this time stable  5]anemia of chronic disease hemoglobin was 6.8 yesterday up to 8.3 after blood transfusion.  6] type2 diabetes not on any insulin at this time blood sugar stable  7]history of schizophrenia mood disorder on multiple psych meds Latuda, Paxil, Wellbutrin, Depakote, Desyrel and Xanax.  8]history of hypertension continue CATAPRESS.  Restart  carvedilol.        DVT prophylaxis: lovenox Code Status:full Family Communication:none Disposition Plan: Dc when snf bed available Consultants: ortho,pccm  Procedures: Status post cephalo-medullary nailing of the right intertrochanteric femur fracture Antimicrobials none  Subjective:  She has no specific complaints Objective: Vitals:   11/17/17 2106 11/17/17 2124 11/18/17 0528 11/18/17 0837  BP:  (!) 176/47 (!) 166/67   Pulse:  69 66   Resp:      Temp:  98.8 F (37.1 C)    TempSrc:  Oral    SpO2: 95% 96% 97% 95%  Weight:      Height:        Intake/Output Summary (Last 24 hours) at 11/18/2017 1037 Last data filed at 11/18/2017 0900 Gross  per 24 hour  Intake 600 ml  Output 1102 ml  Net -502 ml   Filed Weights   11/13/17 0442 11/14/17 0402 11/15/17 0401  Weight: 99.4 kg (219 lb 2.2 oz) 95.5 kg (210 lb 8.6 oz) 95 kg (209 lb 7 oz)    Examination:  General exam: Appears calm and comfortable  Respiratory system: Clear to auscultation.  Respiratory effort normal. Cardiovascular system: S1 & S2 heard, RRR. No JVD, murmurs, rubs, gallops or clicks. No pedal edema. Gastrointestinal system: Abdomen is nondistended, soft and nontender. No organomegaly or masses felt. Normal bowel sounds heard. Central nervous system: Alert and oriented. No focal neurological deficits. Extremities: Symmetric 5 x 5 power. Skin: No rashes, lesions or ulcers Psychiatry: Judgement and insight appear normal. Mood & affect appropriate.     Data Reviewed: I have personally reviewed following labs and imaging studies  CBC: Recent Labs  Lab 11/14/17 0355 11/15/17 0347 11/15/17 1342 11/16/17 0400 11/17/17 0625 11/18/17 0337  WBC 8.4 8.3  --  8.1 7.1 8.5  NEUTROABS  --  5.7  --  5.8 5.1 6.2  HGB 7.9* 6.8* 7.9* 8.2* 8.3* 8.1*  HCT 25.3* 22.0* 24.4* 25.4* 25.9* 25.2*  MCV 92.3 92.1  --  88.5 89.9 90.6  PLT 225 245  --  260 287 371   Basic Metabolic Panel: Recent Labs  Lab 11/14/17 0355 11/15/17 0347 11/16/17 0400 11/17/17 0625 11/18/17 0337  NA 136 138 135 136 134*  K 4.6 3.8 3.5 3.7 3.3*  CL 100* 100* 98* 99* 100*  CO2 28 29 28 29 28   GLUCOSE 239* 122* 89 98 102*  BUN 25* 24* 13 9 8   CREATININE 0.51 0.43* 0.37* 0.46 0.32*  CALCIUM 8.7* 8.6* 8.6* 8.5* 8.4*  MG 1.8  --   --   --   --   PHOS 5.1*  --   --   --   --    GFR: Estimated Creatinine Clearance: 87.8 mL/min (A) (by C-G formula based on SCr of 0.32 mg/dL (L)). Liver Function Tests: No results for input(s): AST, ALT, ALKPHOS, BILITOT, PROT, ALBUMIN in the last 168 hours. No results for input(s): LIPASE, AMYLASE in the last 168 hours. No results for input(s): AMMONIA in the last 168 hours. Coagulation Profile: No results for input(s): INR, PROTIME in the last 168 hours. Cardiac Enzymes: No results for input(s): CKTOTAL, CKMB, CKMBINDEX, TROPONINI in the last 168 hours. BNP (last 3 results) No results for input(s): PROBNP in the last 8760 hours. HbA1C: No results for  input(s): HGBA1C in the last 72 hours. CBG: Recent Labs  Lab 11/14/17 2337 11/15/17 0424 11/15/17 0744 11/15/17 1135 11/15/17 1546  GLUCAP 123* 115* 105* 130* 168*   Lipid Profile: No results for input(s): CHOL, HDL, LDLCALC, TRIG, CHOLHDL, LDLDIRECT in the last 72 hours. Thyroid Function Tests: No results for input(s): TSH, T4TOTAL, FREET4, T3FREE, THYROIDAB in the last 72 hours. Anemia Panel: No results for input(s): VITAMINB12, FOLATE, FERRITIN, TIBC, IRON, RETICCTPCT in the last 72 hours. Sepsis Labs: No results for input(s): PROCALCITON, LATICACIDVEN in the last 168 hours.  Recent Results (from the past 240 hour(s))  Culture, respiratory (NON-Expectorated)     Status: None   Collection Time: 11/09/17  1:28 AM  Result Value Ref Range Status   Specimen Description TRACHEAL ASPIRATE  Final   Special Requests NONE  Final   Gram Stain NO WBC SEEN NO ORGANISMS SEEN   Final   Culture   Final    RARE  Consistent with normal respiratory flora. Performed at [facility], [place] [address] [place], [place] [address]    Report Status 11/11/2017 FINAL  Final  Culture, respiratory (NON-Expectorated)     Status: None   Collection Time: 11/10/17 11:51 PM  Result Value Ref Range Status   Specimen Description TRACHEAL ASPIRATE  Final   Special Requests NONE  Final   Gram Stain   Final    ABUNDANT WBC PRESENT, PREDOMINANTLY PMN RARE GRAM POSITIVE COCCI    Culture   Final    RARE Consistent with normal respiratory flora. Performed at [facility], [place] [address], [place], [place] [address]    Report Status 11/13/2017 FINAL  Final         Radiology Studies: No results found.      Scheduled Meds: . ALPRAZolam  0.5 mg Oral TID  . arformoterol  15 mcg Nebulization BID  . aspirin  81 mg Oral Daily  . budesonide (PULMICORT) nebulizer solution  0.5 mg Nebulization BID  . cloNIDine  0.1 mg Oral Daily  . docusate  100 mg Oral BID  . enoxaparin (LOVENOX) injection   30 mg Subcutaneous Q12H  . ipratropium-albuterol  3 mL Nebulization TID  . lurasidone  40 mg Oral Q breakfast  . pantoprazole  40 mg Oral Daily  . PARoxetine  20 mg Oral Daily  . sennosides  5 mL Oral QHS  . traZODone  50 mg Oral QHS  . valproic acid  250 mg Oral BID   Continuous Infusions: . sodium chloride Stopped (11/15/17 1300)     LOS: 11 days     Georgette Shell, MD [facility] If 7PM-7AM, please contact night-coverage [email or web address] Password [facility] 11/18/2017, 10:37 AM

## 2017-11-18 NOTE — Care Management Important Message (Signed)
Important Message  Patient Details  Name: CELIA FRIEDLAND MRN: [id number] Date of Birth: [birth year]   Medicare Important Message Given:  Yes    Tesa Meadors P Onda Kattner 11/18/2017, 12:50 PM

## 2017-11-18 NOTE — Social Work (Signed)
Pt is a ward of the state. CSW called case manager for the state and left message. Due to holiday, CSW will f/u tomorrow. In addition, patient will need PASSR, Insurance Auth and SNF offers.  CSW will leave 30 day note on chart for review.  Elissa Hefty, LCSW Clinical Social Worker [phone]

## 2017-11-18 NOTE — Plan of Care (Signed)
  Problem: Health Behavior/Discharge Planning: Goal: Ability to manage health-related needs will improve Outcome: Progressing   

## 2017-11-19 LAB — CBC WITH DIFFERENTIAL/PLATELET
ABS IMMATURE GRANULOCYTES: 0 10*3/uL (ref 0.0–0.1)
BASOS ABS: 0 10*3/uL (ref 0.0–0.1)
Basophils Relative: 0 %
EOS PCT: 1 %
Eosinophils Absolute: 0.1 10*3/uL (ref 0.0–0.7)
HCT: 26.5 % — ABNORMAL LOW (ref 36.0–46.0)
HEMOGLOBIN: 8.2 g/dL — AB (ref 12.0–15.0)
Immature Granulocytes: 0 %
LYMPHS ABS: 1.5 10*3/uL (ref 0.7–4.0)
LYMPHS PCT: 22 %
MCH: 28.8 pg (ref 26.0–34.0)
MCHC: 30.9 g/dL (ref 30.0–36.0)
MCV: 93 fL (ref 78.0–100.0)
Monocytes Absolute: 0.6 10*3/uL (ref 0.1–1.0)
Monocytes Relative: 9 %
Neutro Abs: 4.6 10*3/uL (ref 1.7–7.7)
Neutrophils Relative %: 68 %
Platelets: 295 10*3/uL (ref 150–400)
RBC: 2.85 MIL/uL — AB (ref 3.87–5.11)
RDW: 15.8 % — ABNORMAL HIGH (ref 11.5–15.5)
WBC: 6.9 10*3/uL (ref 4.0–10.5)

## 2017-11-19 LAB — BASIC METABOLIC PANEL
ANION GAP: 5 (ref 5–15)
BUN: <5 mg/dL — ABNORMAL LOW (ref 6–20)
CHLORIDE: 102 mmol/L (ref 101–111)
CO2: 30 mmol/L (ref 22–32)
CREATININE: 0.45 mg/dL (ref 0.44–1.00)
Calcium: 8.6 mg/dL — ABNORMAL LOW (ref 8.9–10.3)
GFR calc non Af Amer: >60 mL/min (ref >60)
Glucose, Bld: 93 mg/dL (ref 65–99)
POTASSIUM: 4.5 mmol/L (ref 3.5–5.1)
SODIUM: 137 mmol/L (ref 135–145)

## 2017-11-19 MED ORDER — LACTULOSE 10 GM/15ML PO SOLN
20.0000 g | Freq: Once | ORAL | Status: AC
  Administered 2017-11-19: 20 g via ORAL
  Filled 2017-11-19: qty 30

## 2017-11-19 MED ORDER — POLYETHYLENE GLYCOL 3350 17 G PO PACK
17.0000 g | PACK | Freq: Every day | ORAL | Status: DC
  Administered 2017-11-19: 17 g via ORAL
  Filled 2017-11-19 (×2): qty 1

## 2017-11-19 MED ORDER — BISACODYL 5 MG PO TBEC
10.0000 mg | DELAYED_RELEASE_TABLET | Freq: Every day | ORAL | Status: DC
  Administered 2017-11-19 – 2017-11-20 (×2): 10 mg via ORAL
  Filled 2017-11-19 (×2): qty 2

## 2017-11-19 MED ORDER — IPRATROPIUM-ALBUTEROL 0.5-2.5 (3) MG/3ML IN SOLN
3.0000 mL | Freq: Four times a day (QID) | RESPIRATORY_TRACT | Status: DC | PRN
  Administered 2017-11-19 – 2017-11-20 (×2): 3 mL via RESPIRATORY_TRACT
  Filled 2017-11-19 (×2): qty 3

## 2017-11-19 NOTE — Progress Notes (Signed)
PROGRESS NOTE    Natalie Thomas  PFX:[id number] DOB: 03/19/1961 DOA: 11/07/2017 PCP: Patient, No Pcp Per    Brief Narrative:57 y.o.femalewithhistory of CHF, COPD on home oxygen at 2 L nasal cannula, depression, diabetes type 2, GERD, hypertension, chronic hyponatremia baseline 131, bipolar disorder, depression, GERD, [facility], after sustaining an unwitnessed fall. The patient was found lying on the floor, unknown length. The patient endorsed right hip pain immediately after being found. She is a poor historian, and she may have some degree of confusion at this time, for which other information cannot be obtained. She manages to say "yes" or "no", denying shortness of breath, chest pain, abdominal discomfort. She does admit to be very thirsty, drinking "a lot" of water and ice chips .She had been seen in the ER on May 13 with abdominal pain felt to be secondary to constipation due to narcotics, and she had one prior admission from 4/24 through 10/19/2017 for enteritis and dyspnea, placed on Solu-Medrol,At the time her sodium was128, improved with IV fluids. At time of discharge, she was in fairly stable clinical condition. At the time, she was sent home with sodium tablets 1 g 3 times a day, unclear if she had been taking it .No new infections. No other reported falls  Right hip x-ray comminuted right intertrochanteric fracture with medial rotation of the distal femur.     Assessment & Plan:   Principal Problem:   Closed comminuted intertrochanteric fracture of right femur (HCC) Active Problems:   Chronic hyponatremia   Essential hypertension   GERD   Osteoporosis   Lumbar disc disease   Polycythemia secondary to smoking   Schizoaffective disorder, bipolar type (HCC)   Hyponatremia   Incontinence of urine   Hyperlipidemia   Chronic respiratory failure with hypoxia (HCC)   Anemia   Anxiety and depression   S/P  right hip fracture  1]right hip fracture status post falland cephalo-medullary nailing of right intertrochanteric femur fracture patient is weightbearing as tolerated to right lower extremity.  2]chronic hyponatremia due to polydipsiastable sodium 136 today  3]history of COPD chronic hypoxic respiratory failure uses 2 L of oxygen at home/community-acquired pneumonianot on antibiotics at this time stable  4]history of diastolic CHF echo 09/20/9240 with 55 to 60% ejection fraction with normal systolic and grade 1 diastolic dysfunction.Not on any diuretics at this time stable  5]anemia of chronic disease hemoglobin was 6.8 yesterday up to 8.3after blood transfusion.  6] type2 diabetes not on any insulin at this time blood sugar stable  7]history of schizophrenia mood disorder on multiple psych meds Latuda, Paxil, Wellbutrin, Depakote, Desyrel and Xanax.  8]history of hypertensioncontinue CATAPRESS.  Restart  carvedilol.  9] constipation stool softeners and laxatives to be started today.     DVT prophylaxis: Lovenox Code Status: Full code Family Communication: None Disposition Plan: DC to SNF when bed available Consultants:  PCCM, Ortho Procedures: Status post cephalo-medullary nailing of the right intertrochanteric femur fracture Antimicrobials None Subjective: Patient complaining of constipation denies any nausea vomiting or abdominal pain.  Objective: Vitals:   11/19/17 0444 11/19/17 0905 11/19/17 0907 11/19/17 0910  BP: (!) 172/66     Pulse: 65     Resp: 16     Temp: 98.3 F (36.8 C)     TempSrc: Oral     SpO2: 97% 98% 98% 98%  Weight:      Height:        Intake/Output Summary (  Last 24 hours) at 11/19/2017 1121 Last data filed at 11/19/2017 0900 Gross per 24 hour  Intake 720 ml  Output 1800 ml  Net -1080 ml   Filed Weights   11/13/17 0442 11/14/17 0402 11/15/17 0401  Weight: 99.4 kg (219 lb 2.2 oz) 95.5 kg (210 lb 8.6 oz) 95 kg (209 lb 7  oz)    Examination:  General exam: Appears calm and comfortable  Respiratory system: Clear to auscultation. Respiratory effort normal. Cardiovascular system: S1 & S2 heard, RRR. No JVD, murmurs, rubs, gallops or clicks. No pedal edema. Gastrointestinal system: Abdomen is nondistended, soft and nontender. No organomegaly or masses felt. Normal bowel sounds heard. Central nervous system: Alert and oriented. No focal neurological deficits. Extremities: Symmetric 5 x 5 power. Skin: No rashes, lesions or ulcers Psychiatry: Judgement and insight appear normal. Mood & affect appropriate.     Data Reviewed: I have personally reviewed following labs and imaging studies  CBC: Recent Labs  Lab 11/15/17 0347 11/15/17 1342 11/16/17 0400 11/17/17 0625 11/18/17 0337 11/19/17 0358  WBC 8.3  --  8.1 7.1 8.5 6.9  NEUTROABS 5.7  --  5.8 5.1 6.2 4.6  HGB 6.8* 7.9* 8.2* 8.3* 8.1* 8.2*  HCT 22.0* 24.4* 25.4* 25.9* 25.2* 26.5*  MCV 92.1  --  88.5 89.9 90.6 93.0  PLT 245  --  260 287 307 497   Basic Metabolic Panel: Recent Labs  Lab 11/14/17 0355 11/15/17 0347 11/16/17 0400 11/17/17 0625 11/18/17 0337 11/19/17 0358  NA 136 138 135 136 134* 137  K 4.6 3.8 3.5 3.7 3.3* 4.5  CL 100* 100* 98* 99* 100* 102  CO2 28 29 28 29 28 30   GLUCOSE 239* 122* 89 98 102* 93  BUN 25* 24* 13 9 8  <5*  CREATININE 0.51 0.43* 0.37* 0.46 0.32* 0.45  CALCIUM 8.7* 8.6* 8.6* 8.5* 8.4* 8.6*  MG 1.8  --   --   --   --   --   PHOS 5.1*  --   --   --   --   --    GFR: Estimated Creatinine Clearance: 87.8 mL/min (by C-G formula based on SCr of 0.45 mg/dL). Liver Function Tests: No results for input(s): AST, ALT, ALKPHOS, BILITOT, PROT, ALBUMIN in the last 168 hours. No results for input(s): LIPASE, AMYLASE in the last 168 hours. No results for input(s): AMMONIA in the last 168 hours. Coagulation Profile: No results for input(s): INR, PROTIME in the last 168 hours. Cardiac Enzymes: No results for input(s):  CKTOTAL, CKMB, CKMBINDEX, TROPONINI in the last 168 hours. BNP (last 3 results) No results for input(s): PROBNP in the last 8760 hours. HbA1C: No results for input(s): HGBA1C in the last 72 hours. CBG: Recent Labs  Lab 11/14/17 2337 11/15/17 0424 11/15/17 0744 11/15/17 1135 11/15/17 1546  GLUCAP 123* 115* 105* 130* 168*   Lipid Profile: No results for input(s): CHOL, HDL, LDLCALC, TRIG, CHOLHDL, LDLDIRECT in the last 72 hours. Thyroid Function Tests: No results for input(s): TSH, T4TOTAL, FREET4, T3FREE, THYROIDAB in the last 72 hours. Anemia Panel: No results for input(s): VITAMINB12, FOLATE, FERRITIN, TIBC, IRON, RETICCTPCT in the last 72 hours. Sepsis Labs: No results for input(s): PROCALCITON, LATICACIDVEN in the last 168 hours.  Recent Results (from the past 240 hour(s))  Culture, respiratory (NON-Expectorated)     Status: None   Collection Time: 11/10/17 11:51 PM  Result Value Ref Range Status   Specimen Description TRACHEAL ASPIRATE  Final   Special Requests NONE  Final   Gram Stain   Final    ABUNDANT WBC PRESENT, PREDOMINANTLY PMN RARE GRAM POSITIVE COCCI    Culture   Final    RARE Consistent with normal respiratory flora. Performed at [facility], [place] [address], [place], [place] [address]    Report Status 11/13/2017 FINAL  Final         Radiology Studies: No results found.      Scheduled Meds: . ALPRAZolam  0.5 mg Oral TID  . arformoterol  15 mcg Nebulization BID  . aspirin  81 mg Oral Daily  . bisacodyl  10 mg Oral Daily  . budesonide (PULMICORT) nebulizer solution  0.5 mg Nebulization BID  . carvedilol  25 mg Oral BID WC  . cloNIDine  0.1 mg Oral Daily  . docusate  100 mg Oral BID  . enoxaparin (LOVENOX) injection  30 mg Subcutaneous Q12H  . ipratropium-albuterol  3 mL Nebulization TID  . lurasidone  40 mg Oral Q breakfast  . pantoprazole  40 mg Oral Daily  . PARoxetine  20 mg Oral Daily  . polyethylene glycol  17 g Oral Daily  .  sennosides  5 mL Oral QHS  . traZODone  50 mg Oral QHS  . valproic acid  250 mg Oral BID   Continuous Infusions: . sodium chloride Stopped (11/15/17 1300)     LOS: 12 days     Georgette Shell, MD [facility]  If 7PM-7AM, please contact night-coverage [email or web address] Password [facility] 11/19/2017, 11:21 AM

## 2017-11-19 NOTE — Social Work (Addendum)
CSW called case worker from [facility] again and left message to discuss SNF placement as patient is a ward of the state.  CSW will f/u for SNF placement.  PASSR pending, as CSW faxed 30 day note and hard copy FL2 to [facility] today.  Elissa Hefty, LCSW Clinical Social Worker [phone]

## 2017-11-19 NOTE — Plan of Care (Signed)
  Problem: Clinical Measurements: Goal: Ability to maintain clinical measurements within normal limits will improve Outcome: Progressing   Problem: Clinical Measurements: Goal: Will remain free from infection Outcome: Progressing   Problem: Activity: Goal: Risk for activity intolerance will decrease Outcome: Progressing   Problem: Coping: Goal: Level of anxiety will decrease Outcome: Progressing   Problem: Pain Managment: Goal: General experience of comfort will improve Outcome: Progressing

## 2017-11-19 NOTE — Progress Notes (Signed)
Physical Therapy Treatment Patient Details Name: Natalie Thomas MRN: [id number] DOB: 04/25/1961 Today's Date: 11/19/2017    History of Present Illness 57 y.o. female admitted on 11/07/17 for R hip fx s/p fall s/p IM nail on 11/14/17.  pt needed PRBCs post op on 11/15/17 and also has hyponatremia.  Pt with other significant PMH of COPD on O2 [place] at baseline, schizoaffective disorder (bipolar type), lumbar disc disease, incontinence of urine, HTN, DM2, CHF, anemia, and pelvic fx surgery.     PT Comments    Pt performed therapeutic exercises in supine, and had x1 instance of bowel incontinence.  Performed sit to stand and stand to sit with max to total assistance.  Pt fearful to mobilize and place pressure on RLE.  Pt limited due to fear and anxiety.  Xanax given during session.  Plan for use of stedy next session to instill confidence. SNF remains appropriate at this time based on functional presentation.    Follow Up Recommendations  SNF     Equipment Recommendations  Wheelchair (measurements PT);Wheelchair cushion (measurements PT);Hospital bed;Other (comment)(hoyer lift)    Recommendations for Other Services       Precautions / Restrictions Precautions Precautions: Fall Restrictions Weight Bearing Restrictions: Yes RLE Weight Bearing: Weight bearing as tolerated    Mobility  Bed Mobility Overal bed mobility: Needs Assistance Bed Mobility: Rolling;Supine to Sit;Sit to Supine     Supine to sit: Max assist Sit to supine: Total assist;+2 for physical assistance   General bed mobility comments: Pt performed supine to sit with max assistance +1.  Pt required total assistance to return to bed.    Transfers Overall transfer level: Needs assistance Equipment used: Rolling walker (2 wheeled) Transfers: Sit to/from [employer] Transfers Sit to Stand: Mod assist Stand pivot transfers: Total assist;+2 physical assistance Squat pivot transfers: Max  assist;+2 physical assistance     General transfer comment: Pt able to performed sit to stand but remains frozen when attempting to complete step to potty chair.  Pt required total assistance to scoot hips from bed to [facility] at bed side.  To return back to bed post toilet transfer patient required max +2 for squat pivot.    Ambulation/Gait Ambulation/Gait assistance: (1 step toward commode then patient refused and requires assistance to advance hips to [facility] from standing.  )               Stairs             Wheelchair Mobility    Modified Rankin (Stroke Patients Only)       Balance Overall balance assessment: Needs assistance   Sitting balance-Leahy Scale: Fair       Standing balance-Leahy Scale: Poor                              Cognition Arousal/Alertness: Awake/alert Behavior During Therapy: Anxious Overall Cognitive Status: No family/caregiver present to determine baseline cognitive functioning                                 General Comments: Pt with psych hx, but unsure of cognitive baseline.       Exercises Total Joint Exercises Ankle Circles/Pumps: AROM;20 reps;Supine;Both Quad Sets: AROM;Both;10 reps;Supine Short Arc Quad: AROM;Both;10 reps;Supine;AAROM Heel Slides: AROM;Both;10 reps;Supine;AAROM Hip ABduction/ADduction: AAROM;Both;10 reps;Supine    General Comments  Pertinent Vitals/Pain Pain Assessment: 0-10 Pain Score: 9  Pain Location: right and left leg with mobility (R>L) Pain Descriptors / Indicators: Guarding;Grimacing;Moaning Pain Intervention(s): Monitored during session;Repositioned;Ice applied    Home Living                      Prior Function            PT Goals (current goals can now be found in the care plan section) Acute Rehab PT Goals Patient Stated Goal: unable to state Potential to Achieve Goals: Fair Progress towards PT goals: Progressing toward goals    Frequency     Min 3X/week      PT Plan Current plan remains appropriate    Co-evaluation              AM-PAC PT "6 Clicks" Daily Activity  Outcome Measure  Difficulty turning over in bed (including adjusting bedclothes, sheets and blankets)?: Unable Difficulty moving from lying on back to sitting on the side of the bed? : Unable Difficulty sitting down on and standing up from a chair with arms (e.g., wheelchair, bedside commode, etc,.)?: Unable Help needed moving to and from a bed to chair (including a wheelchair)?: A Lot Help needed walking in hospital room?: Total Help needed climbing 3-5 steps with a railing? : Total 6 Click Score: 7    End of Session Equipment Utilized During Treatment: Gait belt;Oxygen Activity Tolerance: Patient limited by pain;Other (comment) Patient left: in bed;with call bell/phone within reach Nurse Communication: Mobility status PT Visit Diagnosis: History of falling (Z91.81);Muscle weakness (generalized) (M62.81);Difficulty in walking, not elsewhere classified (R26.2);Pain Pain - Right/Left: Right Pain - part of body: Leg     Time: 1450-1530 PT Time Calculation (min) (ACUTE ONLY): 40 min  Charges:  $Therapeutic Exercise: 8-22 mins $Therapeutic Activity: 23-37 mins                    G CodesGovernor Rooks, PTA pager [phone]    Cristela Blue 11/19/2017, 4:00 PM

## 2017-11-20 LAB — CBC WITH DIFFERENTIAL/PLATELET
ABS IMMATURE GRANULOCYTES: 0 10*3/uL (ref 0.0–0.1)
Basophils Absolute: 0 10*3/uL (ref 0.0–0.1)
Basophils Relative: 0 %
Eosinophils Absolute: 0.1 10*3/uL (ref 0.0–0.7)
Eosinophils Relative: 1 %
HEMATOCRIT: 26.7 % — AB (ref 36.0–46.0)
Hemoglobin: 8.4 g/dL — ABNORMAL LOW (ref 12.0–15.0)
IMMATURE GRANULOCYTES: 1 %
LYMPHS ABS: 1.1 10*3/uL (ref 0.7–4.0)
Lymphocytes Relative: 15 %
MCH: 29 pg (ref 26.0–34.0)
MCHC: 31.5 g/dL (ref 30.0–36.0)
MCV: 92.1 fL (ref 78.0–100.0)
MONO ABS: 0.7 10*3/uL (ref 0.1–1.0)
MONOS PCT: 9 %
NEUTROS ABS: 5.5 10*3/uL (ref 1.7–7.7)
NEUTROS PCT: 74 %
Platelets: 314 10*3/uL (ref 150–400)
RBC: 2.9 MIL/uL — ABNORMAL LOW (ref 3.87–5.11)
RDW: 15 % (ref 11.5–15.5)
WBC: 7.4 10*3/uL (ref 4.0–10.5)

## 2017-11-20 NOTE — Clinical Social Work Placement (Signed)
   [place] RN to call report to [phone]  Date:  11/20/2017  Patient Details  Name: Natalie Thomas MRN: [id number] Date of Birth: 1961-06-21  Clinical Social Work is seeking post-discharge placement for this patient at the [place] level of care (*CSW will initial, date and re-position this form in  chart as items are completed):  Yes   Patient/family provided with [place] Work Department's list of facilities offering this level of care within the geographic area requested by the patient (or if unable, by the patient's family).  Yes   Patient/family informed of their freedom to choose among providers that offer the needed level of care, that participate in Medicare, Medicaid or managed care program needed by the patient, have an available bed and are willing to accept the patient.  Yes   Patient/family informed of [facility]'s ownership interest in [facility] and [facility], as well as of the fact that they are under no obligation to receive care at these facilities.  PASRR submitted to EDS on       PASRR number received on       Existing PASRR number confirmed on       FL2 transmitted to all facilities in geographic area requested by pt/family on       FL2 transmitted to all facilities within larger geographic area on       Patient informed that his/her managed care company has contracts with or will negotiate with certain facilities, including the following:        Yes   Patient/family informed of bed offers received.  Patient chooses bed at [facility]     Physician recommends and patient chooses bed at      Patient to be transferred to [facility] on 11/20/17.  Patient to be transferred to facility by PTAR     Patient family notified on 11/20/17 of transfer.  Name of family member notified:  Elfredia Nevins [phone]      PHYSICIAN       Additional Comment:    _______________________________________________ Alexander Mt, [place] 11/20/2017, 1:15 PM

## 2017-11-20 NOTE — Social Work (Addendum)
CSW f/u for disposition.  CSW unable to reach social worker, dhmh, Terressa Koyanagi. CSW then spoke to Ms. Brown at [facility] and left verbal message explaining the disposition plan. Ms.Brown will f/u with social woorker directly to assist with SNF placement.  10:18am: CSW received call from [place] at [place], [phone]. CSW discussed SNF offers. Case worker accepted SNF bed at [facility].  CSW contacted SNF to confirm and will f/u.  CSW resent clinicals for PASSR.  11:30am: Passr# [id number]   Elissa Hefty, [place] Social Worker [phone]

## 2017-11-20 NOTE — Social Work (Addendum)
Clinical Social Worker facilitated patient discharge including contacting patient family and facility to confirm patient discharge plans. Message left for pt guardian ,[name] [phone]    Clinical information faxed to facility and family agreeable with plan.  CSW arranged ambulance transport via [facility] to [place].  RN to call [phone] with report prior to discharge.  Clinical Social Worker will sign off for now as social work intervention is no longer needed. Please consult [place] again if new need arises.  Alexander Mt, [place] Social Worker [phone]

## 2017-11-20 NOTE — Progress Notes (Signed)
Rn called report to [employer] at [employer]. Answered all questions to satisfaction. Pt waiting on [facility].

## 2017-12-19 ENCOUNTER — Ambulatory Visit: Payer: Self-pay | Admitting: Endocrinology

## 2017-12-19 DIAGNOSIS — Z0289 Encounter for other administrative examinations: Secondary | ICD-10-CM

## 2017-12-20 ENCOUNTER — Emergency Department (HOSPITAL_COMMUNITY)
Admission: EM | Admit: 2017-12-20 | Discharge: 2017-12-22 | Disposition: A | Discharge status: Skilled Nursing Facility | Payer: Medicare Other | Attending: Emergency Medicine | Admitting: Emergency Medicine

## 2017-12-20 ENCOUNTER — Encounter (HOSPITAL_COMMUNITY): Payer: Self-pay

## 2017-12-20 DIAGNOSIS — Z760 Encounter for issue of repeat prescription: Secondary | ICD-10-CM | POA: Diagnosis present

## 2017-12-20 DIAGNOSIS — Z79899 Other long term (current) drug therapy: Secondary | ICD-10-CM | POA: Insufficient documentation

## 2017-12-20 DIAGNOSIS — Z7982 Long term (current) use of aspirin: Secondary | ICD-10-CM | POA: Diagnosis not present

## 2017-12-20 DIAGNOSIS — Z7901 Long term (current) use of anticoagulants: Secondary | ICD-10-CM | POA: Diagnosis not present

## 2017-12-20 DIAGNOSIS — I10 Essential (primary) hypertension: Secondary | ICD-10-CM | POA: Insufficient documentation

## 2017-12-20 DIAGNOSIS — F17210 Nicotine dependence, cigarettes, uncomplicated: Secondary | ICD-10-CM | POA: Insufficient documentation

## 2017-12-20 DIAGNOSIS — J9610 Chronic respiratory failure, unspecified whether with hypoxia or hypercapnia: Secondary | ICD-10-CM

## 2017-12-20 DIAGNOSIS — F259 Schizoaffective disorder, unspecified: Secondary | ICD-10-CM | POA: Diagnosis present

## 2017-12-20 DIAGNOSIS — E119 Type 2 diabetes mellitus without complications: Secondary | ICD-10-CM | POA: Diagnosis not present

## 2017-12-20 DIAGNOSIS — G8929 Other chronic pain: Secondary | ICD-10-CM | POA: Diagnosis not present

## 2017-12-20 LAB — I-STAT CHEM 8, ED
BUN: 5 mg/dL — ABNORMAL LOW (ref 6–20)
CREATININE: 0.5 mg/dL (ref 0.44–1.00)
Calcium, Ion: 1.16 mmol/L (ref 1.15–1.40)
Chloride: 91 mmol/L — ABNORMAL LOW (ref 98–111)
Glucose, Bld: 88 mg/dL (ref 70–99)
HCT: 31 % — ABNORMAL LOW (ref 36.0–46.0)
HEMOGLOBIN: 10.5 g/dL — AB (ref 12.0–15.0)
Potassium: 3.6 mmol/L (ref 3.5–5.1)
Sodium: 133 mmol/L — ABNORMAL LOW (ref 135–145)
TCO2: 29 mmol/L (ref 22–32)

## 2017-12-20 LAB — CBC WITH DIFFERENTIAL/PLATELET
BASOS ABS: 0 10*3/uL (ref 0.0–0.1)
Basophils Relative: 1 %
Eosinophils Absolute: 0 10*3/uL (ref 0.0–0.7)
Eosinophils Relative: 1 %
HCT: 32.4 % — ABNORMAL LOW (ref 36.0–46.0)
Hemoglobin: 10.5 g/dL — ABNORMAL LOW (ref 12.0–15.0)
Lymphocytes Relative: 30 %
Lymphs Abs: 1.2 10*3/uL (ref 0.7–4.0)
MCH: 28.5 pg (ref 26.0–34.0)
MCHC: 32.4 g/dL (ref 30.0–36.0)
MCV: 87.8 fL (ref 78.0–100.0)
MONO ABS: 0.4 10*3/uL (ref 0.1–1.0)
Monocytes Relative: 11 %
NEUTROS ABS: 2.2 10*3/uL (ref 1.7–7.7)
Neutrophils Relative %: 57 %
PLATELETS: 210 10*3/uL (ref 150–400)
RBC: 3.69 MIL/uL — ABNORMAL LOW (ref 3.87–5.11)
RDW: 14.4 % (ref 11.5–15.5)
WBC: 3.9 10*3/uL — AB (ref 4.0–10.5)

## 2017-12-20 MED ORDER — ALPRAZOLAM 0.5 MG PO TABS
0.5000 mg | ORAL_TABLET | Freq: Three times a day (TID) | ORAL | Status: DC
  Administered 2017-12-20 – 2017-12-21 (×2): 0.5 mg via ORAL
  Filled 2017-12-20 (×2): qty 1

## 2017-12-20 MED ORDER — TRAZODONE HCL 50 MG PO TABS
50.0000 mg | ORAL_TABLET | Freq: Every day | ORAL | Status: DC
  Administered 2017-12-20 – 2017-12-21 (×2): 50 mg via ORAL
  Filled 2017-12-20 (×2): qty 1

## 2017-12-20 MED ORDER — MELATONIN 5 MG PO TABS
10.0000 mg | ORAL_TABLET | Freq: Every day | ORAL | Status: DC
  Administered 2017-12-20 – 2017-12-21 (×2): 10 mg via ORAL
  Filled 2017-12-20 (×2): qty 2

## 2017-12-20 MED ORDER — PANTOPRAZOLE SODIUM 40 MG PO TBEC
40.0000 mg | DELAYED_RELEASE_TABLET | Freq: Every day | ORAL | Status: DC
  Administered 2017-12-21 – 2017-12-22 (×2): 40 mg via ORAL
  Filled 2017-12-20 (×2): qty 1

## 2017-12-20 MED ORDER — ALBUTEROL SULFATE HFA 108 (90 BASE) MCG/ACT IN AERS: 2.0000 | INHALATION_SPRAY | RESPIRATORY_TRACT | Status: DC | PRN

## 2017-12-20 MED ORDER — MAGNESIUM OXIDE 400 (241.3 MG) MG PO TABS
800.0000 mg | ORAL_TABLET | Freq: Two times a day (BID) | ORAL | Status: DC
  Administered 2017-12-20 – 2017-12-22 (×4): 800 mg via ORAL
  Filled 2017-12-20 (×4): qty 2

## 2017-12-20 MED ORDER — LURASIDONE HCL 40 MG PO TABS
40.0000 mg | ORAL_TABLET | Freq: Every day | ORAL | Status: DC
  Administered 2017-12-21 – 2017-12-22 (×2): 40 mg via ORAL
  Filled 2017-12-20 (×2): qty 1

## 2017-12-20 MED ORDER — OXYCODONE HCL 5 MG PO TABS
5.0000 mg | ORAL_TABLET | Freq: Four times a day (QID) | ORAL | Status: DC
  Administered 2017-12-20 – 2017-12-22 (×7): 5 mg via ORAL
  Filled 2017-12-20 (×7): qty 1

## 2017-12-20 MED ORDER — ASPIRIN EC 81 MG PO TBEC
81.0000 mg | DELAYED_RELEASE_TABLET | Freq: Every day | ORAL | Status: DC
  Administered 2017-12-21 – 2017-12-22 (×2): 81 mg via ORAL
  Filled 2017-12-20 (×3): qty 1

## 2017-12-20 MED ORDER — CARVEDILOL 25 MG PO TABS
25.0000 mg | ORAL_TABLET | Freq: Two times a day (BID) | ORAL | Status: DC
  Administered 2017-12-20 – 2017-12-22 (×4): 25 mg via ORAL
  Filled 2017-12-20 (×5): qty 1

## 2017-12-20 MED ORDER — ACETAMINOPHEN 325 MG PO TABS: 650.0000 mg | ORAL_TABLET | Freq: Four times a day (QID) | ORAL | Status: DC | PRN

## 2017-12-20 MED ORDER — ALUM & MAG HYDROXIDE-SIMETH 200-200-20 MG/5ML PO SUSP: 30.0000 mL | Freq: Four times a day (QID) | ORAL | Status: DC | PRN

## 2017-12-20 MED ORDER — IPRATROPIUM-ALBUTEROL 0.5-2.5 (3) MG/3ML IN SOLN
3.0000 mL | Freq: Three times a day (TID) | RESPIRATORY_TRACT | Status: DC
  Administered 2017-12-20 – 2017-12-22 (×6): 3 mL via RESPIRATORY_TRACT
  Filled 2017-12-20 (×7): qty 3

## 2017-12-20 MED ORDER — FERROUS SULFATE 325 (65 FE) MG PO TABS
325.0000 mg | ORAL_TABLET | Freq: Every day | ORAL | Status: DC
  Administered 2017-12-21 – 2017-12-22 (×2): 325 mg via ORAL
  Filled 2017-12-20 (×2): qty 1

## 2017-12-20 MED ORDER — CALCIUM POLYCARBOPHIL 625 MG PO TABS
625.0000 mg | ORAL_TABLET | Freq: Every day | ORAL | Status: DC
  Administered 2017-12-20 – 2017-12-21 (×2): 625 mg via ORAL
  Filled 2017-12-20 (×2): qty 1

## 2017-12-20 MED ORDER — HYDRALAZINE HCL 50 MG PO TABS
100.0000 mg | ORAL_TABLET | Freq: Three times a day (TID) | ORAL | Status: DC
  Administered 2017-12-20 – 2017-12-22 (×5): 100 mg via ORAL
  Filled 2017-12-20 (×6): qty 2

## 2017-12-20 MED ORDER — GUAIFENESIN 100 MG/5ML PO SYRP
200.0000 mg | ORAL_SOLUTION | Freq: Three times a day (TID) | ORAL | Status: DC | PRN
  Filled 2017-12-20: qty 10

## 2017-12-20 MED ORDER — BUPROPION HCL ER (XL) 150 MG PO TB24
300.0000 mg | ORAL_TABLET | Freq: Every day | ORAL | Status: DC
  Administered 2017-12-20 – 2017-12-22 (×3): 300 mg via ORAL
  Filled 2017-12-20 (×3): qty 2

## 2017-12-20 MED ORDER — FUROSEMIDE 40 MG PO TABS
80.0000 mg | ORAL_TABLET | Freq: Every day | ORAL | Status: DC
Start: 2017-12-21 — End: 2017-12-22
  Administered 2017-12-21 – 2017-12-22 (×2): 80 mg via ORAL
  Filled 2017-12-20 (×2): qty 2

## 2017-12-20 MED ORDER — HYDROXYZINE HCL 25 MG PO TABS: 25.0000 mg | ORAL_TABLET | Freq: Three times a day (TID) | ORAL | Status: DC | PRN

## 2017-12-20 MED ORDER — DICYCLOMINE HCL 20 MG PO TABS
20.0000 mg | ORAL_TABLET | Freq: Two times a day (BID) | ORAL | Status: DC
  Administered 2017-12-20 – 2017-12-22 (×4): 20 mg via ORAL
  Filled 2017-12-20 (×4): qty 1

## 2017-12-20 MED ORDER — DM-GUAIFENESIN ER 30-600 MG PO TB12
1.0000 | ORAL_TABLET | Freq: Two times a day (BID) | ORAL | Status: DC | PRN
  Filled 2017-12-20: qty 1

## 2017-12-20 MED ORDER — DIVALPROEX SODIUM 250 MG PO DR TAB
250.0000 mg | DELAYED_RELEASE_TABLET | Freq: Two times a day (BID) | ORAL | Status: DC
  Administered 2017-12-20: 250 mg via ORAL
  Filled 2017-12-20: qty 1

## 2017-12-20 MED ORDER — SUCRALFATE 1 G PO TABS
1.0000 g | ORAL_TABLET | Freq: Four times a day (QID) | ORAL | Status: DC
  Administered 2017-12-20 – 2017-12-22 (×6): 1 g via ORAL
  Filled 2017-12-20 (×8): qty 1

## 2017-12-20 MED ORDER — SIMETHICONE 80 MG PO CHEW
80.0000 mg | CHEWABLE_TABLET | Freq: Three times a day (TID) | ORAL | Status: DC | PRN
  Filled 2017-12-20: qty 1

## 2017-12-20 MED ORDER — ENOXAPARIN SODIUM 40 MG/0.4ML ~~LOC~~ SOLN
40.0000 mg | SUBCUTANEOUS | Status: DC
  Administered 2017-12-21 – 2017-12-22 (×2): 40 mg via SUBCUTANEOUS
  Filled 2017-12-20 (×3): qty 0.4

## 2017-12-20 MED ORDER — CLONIDINE HCL 0.3 MG/24HR TD PTWK
0.3000 mg | MEDICATED_PATCH | TRANSDERMAL | Status: DC
  Filled 2017-12-20: qty 1

## 2017-12-20 MED ORDER — PRAVASTATIN SODIUM 20 MG PO TABS
20.0000 mg | ORAL_TABLET | Freq: Every day | ORAL | Status: DC
  Administered 2017-12-21 – 2017-12-22 (×2): 20 mg via ORAL
  Filled 2017-12-20 (×3): qty 1

## 2017-12-20 MED ORDER — METHYLCELLULOSE (LAXATIVE) 500 MG PO TABS: 500.0000 mg | ORAL_TABLET | Freq: Every day | ORAL | Status: DC

## 2017-12-20 MED ORDER — FLUTICASONE FUROATE-VILANTEROL 100-25 MCG/INH IN AEPB: 1.0000 | INHALATION_SPRAY | Freq: Every day | RESPIRATORY_TRACT | Status: DC

## 2017-12-20 MED ORDER — POLYETHYLENE GLYCOL 3350 17 G PO PACK
17.0000 g | PACK | Freq: Every day | ORAL | Status: DC | PRN
Start: 2017-12-20 — End: 2017-12-22
  Filled 2017-12-20: qty 1

## 2017-12-20 MED ORDER — SODIUM CHLORIDE 1 G PO TABS
1.0000 g | ORAL_TABLET | Freq: Three times a day (TID) | ORAL | Status: DC
  Administered 2017-12-20 – 2017-12-22 (×5): 1 g via ORAL
  Filled 2017-12-20 (×6): qty 1

## 2017-12-20 MED ORDER — FLUTICASONE PROPIONATE 50 MCG/ACT NA SUSP
2.0000 | Freq: Two times a day (BID) | NASAL | Status: DC
  Administered 2017-12-21 – 2017-12-22 (×3): 2 via NASAL
  Filled 2017-12-20: qty 16

## 2017-12-20 MED ORDER — PAROXETINE HCL 20 MG PO TABS
20.0000 mg | ORAL_TABLET | Freq: Every day | ORAL | Status: DC
  Administered 2017-12-21 – 2017-12-22 (×2): 20 mg via ORAL
  Filled 2017-12-20 (×3): qty 1

## 2017-12-20 MED ORDER — LOPERAMIDE HCL 2 MG PO CAPS: 2.0000 mg | ORAL_CAPSULE | ORAL | Status: DC | PRN

## 2017-12-20 MED ORDER — NIFEDIPINE ER OSMOTIC RELEASE 30 MG PO TB24
90.0000 mg | ORAL_TABLET | Freq: Every day | ORAL | Status: DC
  Administered 2017-12-21 – 2017-12-22 (×2): 90 mg via ORAL
  Filled 2017-12-20 (×2): qty 1

## 2017-12-20 NOTE — Progress Notes (Addendum)
Consult request has been received. CSW attempting to follow up at present time.  CSW spoke with the EDP who stated pt is, and chart reflects this, that pt is from [facility] and was before [facility] at a SNF until pt "experienced insurance issues".  Per EPD, pt states pt arrived at the ALF ([facility]) without the pt's needed meds, without needed oxygen and pt states she doesn't want to return.    CSW then called [facility] for collateral information and spoke to RN Miss Evans who stated that approx 2-4 months ago was [facility] and returned home able to walk.  Pt then feel at home, was treated at a hospital (per [facility] then [facility]) and D/C'd to a faciity.  Per [place] social work note from 5/28 pt is a ward of the state and has a "[facility] Social Worker Michele Juchatz at ph: [phone] who accepted a bed at [facility] on 5/29 on behalf of the pt, but apparently that same day, per notes, pt  D/C'd to [facility].    Per FYI [place] "Patient is a ward of the [place] [employer].  Pt's newly assigned social worker is Michele Juchatz, [phone]; after-hours/emergency contact number is [phone]".  CSW contacted [place] [employer] Michele Juchatz, at ph: [phone] (office is [phone]) who confirmed [place] [employer] is the pt's legal guardian and that pt only had an order for 5 days of pain medication and that is why the pt did not have pain medication at [facility].    Per caseworker [facility] is to "possibly" begin PT/OT next week at [facility].  Of note: Pt's PASSR was [id number]   CSW updated the [facility] RN CN covering [facility] who will call the [facility] ED to advise of HH needs being possibly met on 6/28 if pt is appropriate for D/C back to [facility] with HH Services and needed medical equipment (hospital bed  and/or [place] lift).    [facility] RN CM stated these needs cold not be met until 6/29 (Saturday morning) and that the [facility] RN CM will alert the AM [facility] ED CM Alyssa who will be on duty on 6/29.  EPD will place RN CM consult or these needs.  Miss Evans at [facility] had stated a medical bed would be needed for pt, as well as a "water apparatus jug" for the pt's oxygen tank which is currently inoperable, per the pt and per [facility].  Of Note: Miss Evans at [facility] stated Mia the Assistant Director at [facility] at ph: [phone] would know what the pt would need to return to [facility] on 6/29.  Pt's needs at D/C per pt:  1. Pain Medication 2. Working Oxygen Tank  Pt's needs per RN Miss Evans at [facility]:  1. Pain medication (if appropriate). 2. Working oxygen tank 3. Hospital bed (pt cannot sit up or remain sitting on her own due to broken hip).  2nd shift ED CSW will leave handoff for 1st shift ED CSW and the CSW Asst Director  CSW will update RN and EDP.   F. , LCSW, LCAS, CSI Clinical Social Worker Ph: [phone]             

## 2017-12-20 NOTE — ED Triage Notes (Addendum)
Pt presents to ED via EMS for medication refill and placement. EMS reports that the pt was sent to [facility] this afternoon from her previous rehab facility, but the facility reports that they cannot care for the patient. Facility does not have the pt's medications, and pt reports that she needs her night time medications. Pt originally at [facility] for R femur fx, and transferred to ALF for insurance issues.

## 2017-12-20 NOTE — ED Notes (Signed)
Bed: [facility] Expected date:  Expected time:  Means of arrival:  Comments: EMS 57 yo female from facility-SOB/O2 not working and she doesn't want to be there anymore

## 2017-12-20 NOTE — ED Provider Notes (Signed)
[facility] DEPT Provider Note   CSN: [id number] Arrival date & time: 12/20/17  1930     History   Chief Complaint Chief Complaint  Patient presents with  . Medication Refill    problem with medications at assisted living facility    HPI Natalie Thomas is a 57 y.o. female.  HPI Patient presents from assisted living.  Reportedly was at a skilled nursing rehab up until today.  Per patient the patient's insurance ran out so she was transferred to the assisted living.  States she got to the assisted living and they did not have her medications or working oxygen.  States they were able to find a tank but her oxygen had been around 84 most of the day.  Occasional cough.  States that she in the assisted living decided that she needs to come here because they could not take care of her.  Complaining of pain all over and states that she needs her nighttime medicines. Past Medical History:  Diagnosis Date  . Acute on chronic respiratory failure with hypoxia and hypercapnia ([place]) 10/07/2017  . Anemia 10/16/2017  . Anxiety and depression   . CHF (congestive heart failure) ([place])   . Chronic hyponatremia 04/08/2007   Qualifier: Diagnosis of  By: Marca Ancona RMA, Lucy    . Chronic respiratory failure with hypoxia ([place]) 10/07/2017  . Closed comminuted intertrochanteric fracture of right femur ([place]) 11/07/2017  . COPD 04/08/2007   Qualifier: Diagnosis of  By: Marca Ancona RMA, Lucy    . Cough 05/08/2011  . Depression   . Diabetes mellitus, type 2 ([place])    pt denies but states that she has been treated for DM  . Dyspnea 10/16/2017  . Dysuria 04/08/2007   Qualifier: Diagnosis of  By: Reatha Armour, Lucy    . Essential hypertension 04/08/2007   Qualifier: Diagnosis of  By: Marca Ancona RMA, Lucy    . GERD (gastroesophageal reflux disease)   . HIP PAIN, LEFT 12/12/2007   Qualifier: Diagnosis of  By: Loanne Drilling MD, Jacelyn Pi   . Hyperlipidemia 10/07/2017  . Hypertension   . Hyponatremia  07/27/2011  . Incontinence of urine 08/14/2011  . Lumbar disc disease 04/09/2011  . MENOPAUSE, PREMATURE 04/08/2007   Qualifier: Diagnosis of  By: Marca Ancona RMA, Lucy    . 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 Osteoporosis   . Osteoporosis 04/08/2007   Qualifier: Diagnosis of  By: Reatha Armour, Lucy    . Polycythemia secondary to smoking 04/09/2011  . S/P right hip fracture 11/07/2017  . Schizoaffective disorder, bipolar type ([place]) 07/05/2011    Patient Active Problem List   Diagnosis Date Noted  . S/P right hip fracture 11/07/2017  . Closed comminuted intertrochanteric fracture of right femur ([place]) 11/07/2017  . Anxiety and depression   . Dyspnea 10/16/2017  . Anemia 10/16/2017  . COPD exacerbation ([place]) 10/07/2017  . Hyperlipidemia 10/07/2017  . Chronic respiratory failure with hypoxia ([place]) 10/07/2017  . Acute on chronic respiratory failure with hypoxia and hypercapnia ([place]) 10/07/2017  . Incontinence of urine 08/14/2011  . Hyponatremia 07/27/2011  . Schizoaffective disorder, bipolar type ([place]) 07/05/2011  . Cough 05/08/2011  . Encounter for long-term (current) use of other medications 04/09/2011  . Lumbar disc disease 04/09/2011  . Polycythemia secondary to smoking 04/09/2011  . HIP PAIN, LEFT 12/12/2007  . 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 DIABETES MELLITUS, TYPE II 04/08/2007  . MENOPAUSE, PREMATURE 04/08/2007  .  Chronic hyponatremia 04/08/2007  . Schizoaffective disorder ([place]) 04/08/2007  . DEPRESSION 04/08/2007  . Essential hypertension 04/08/2007  . COPD 04/08/2007  . GERD 04/08/2007  . Osteoporosis 04/08/2007  . DYSURIA 04/08/2007    Past Surgical History:  Procedure Laterality Date  . EYE SURGERY     on left  eye, pt states that she sees bad out of the right eye and needs surgery there  . INTRAMEDULLARY (IM) NAIL INTERTROCHANTERIC Right 11/13/2017   Procedure: INTRAMEDULLARY (IM) NAIL INTERTROCHANTRIC;   Surgeon: Shona Needles, MD;  Location: [facility];  Service: Orthopedics;  Laterality: Right;  . PELVIC FRACTURE SURGERY       OB History   None      Home Medications    Prior to Admission medications   Medication Sig Start Date End Date Taking? Authorizing Provider  acetaminophen (TYLENOL) 325 MG tablet Take 650 mg by mouth every 6 (six) hours as needed for mild pain.   Yes [provider]  albuterol (PROVENTIL HFA;VENTOLIN HFA) 108 (90 Base) MCG/ACT inhaler Inhale 2 puffs into the lungs every 4 (four) hours as needed for wheezing or shortness of breath. 07/05/17  Yes Ward, Delice Bison, DO  ALPRAZolam (XANAX) 0.5 MG tablet Take 1 tablet (0.5 mg total) by mouth 3 (three) times daily. 11/18/17  Yes Georgette Shell, MD  alum & mag hydroxide-simeth ([place]) 200-200-20 MG/5ML suspension Take 30 mLs by mouth every 6 (six) hours as needed for indigestion or heartburn.   Yes [provider]  aspirin 81 MG tablet Take 1 tablet (81 mg total) by mouth daily. For heart health 08/06/11  Yes Greig Castilla, FNP  buPROPion (WELLBUTRIN XL) 300 MG 24 hr tablet Take 1 tablet (300 mg total) by mouth every morning. Patient taking differently: Take 300 mg by mouth every morning.  10/19/17 10/19/18 Yes Aline August, MD  carvedilol (COREG) 25 MG tablet Take 25 mg by mouth 2 (two) times daily with a meal.   Yes [provider]  cloNIDine (CATAPRES - DOSED IN MG/24 HR) 0.3 mg/24hr patch Place 0.3 mg onto the skin once a week. On Monday   Yes [provider]  dextromethorphan-guaiFENesin (MUCINEX DM) 30-600 MG 12hr tablet Take 1 tablet by mouth every 12 (twelve) hours as needed for cough.   Yes [provider]  dicyclomine (BENTYL) 20 MG tablet Take 1 tablet (20 mg total) by mouth 2 (two) times daily. 11/04/17  Yes Recardo Evangelist, PA-C  divalproex (DEPAKOTE) 250 MG DR tablet Take 1 tablet (250 mg total) by mouth every 12 (twelve) hours. Patient taking differently:  Take 250 mg by mouth 3 (three) times daily.  08/12/17  Yes Nita Sells, MD  ferrous sulfate 325 (65 FE) MG tablet Take 325 mg by mouth daily with breakfast.   Yes [provider]  fluticasone (FLONASE) 50 MCG/ACT nasal spray Place 2 sprays into both nostrils 2 (two) times daily.   Yes [provider]  fluticasone furoate-vilanterol (BREO ELLIPTA) 100-25 MCG/INH AEPB Inhale 1 puff into the lungs daily.   Yes [provider]  furosemide (LASIX) 80 MG tablet Take 80 mg by mouth 2 (two) times daily.    Yes [provider]  guaifenesin (ROBITUSSIN) 100 MG/5ML syrup Take 200 mg by mouth 3 (three) times daily as needed for cough.   Yes [provider]  hydrALAZINE (APRESOLINE) 100 MG tablet Take 100 mg by mouth 3 (three) times daily.   Yes [provider]  hydrOXYzine (ATARAX/VISTARIL)  25 MG tablet Take 1 tablet (25 mg total) by mouth 3 (three) times daily as needed for anxiety. 10/19/17  Yes Aline August, MD  ipratropium-albuterol (DUONEB) 0.5-2.5 (3) MG/3ML SOLN Take 3 mLs by nebulization 3 (three) times daily.    Yes [provider]  loperamide (IMODIUM A-D) 2 MG tablet Take 2 mg by mouth every 4 (four) hours as needed for diarrhea or loose stools.   Yes [provider]  lurasidone (LATUDA) 80 MG TABS tablet Take 1 tablet (80 mg total) by mouth daily with breakfast. Patient taking differently: Take 40 mg by mouth daily with breakfast.  08/12/17  Yes Nita Sells, MD  magnesium oxide (MAG-OX) 400 (241.3 Mg) MG tablet Take 2 tablets (800 mg total) by mouth 2 (two) times daily. 08/12/17  Yes Nita Sells, MD  Melatonin 10 MG TABS Take 10 mg by mouth at bedtime.   Yes [provider]  Menthol, Topical Analgesic, (BIOFREEZE COLORLESS EX) Apply 1 application topically 3 (three) times daily. TO UPPER ABDOMEN   Yes [provider]  Methylcellulose, Laxative, 500 MG TABS Take 500 mg by mouth at bedtime.    Yes [provider]  Multiple Vitamin (DAILY-VITE PO) Take 1 tablet by mouth daily.   Yes [provider]  NIFEdipine (PROCARDIA XL/ADALAT-CC) 90 MG 24 hr tablet Take 90 mg by mouth daily.   Yes [provider]  omeprazole (PRILOSEC) 20 MG capsule Take 1 capsule (20 mg total) by mouth 2 (two) times daily before a meal. For acid reflux. 10/19/17  Yes Aline August, MD  oxycodone (OXY-IR) 5 MG capsule Take 5 mg by mouth 4 (four) times daily.   Yes [provider]  PARoxetine (PAXIL) 20 MG tablet Take 1 tablet (20 mg total) by mouth daily. 10/19/17  Yes Aline August, MD  polyethylene glycol (MIRALAX) packet Take 17 g by mouth daily as needed. 10/19/17  Yes Aline August, MD  pravastatin (PRAVACHOL) 20 MG tablet Take 20 mg by mouth daily.   Yes [provider]  promethazine (PHENERGAN) 25 MG/ML injection Inject 25 mg into the vein every 6 (six) hours as needed for nausea or vomiting.   Yes [provider]  simethicone (MYLICON) 80 MG chewable tablet Chew 80 mg by mouth 3 (three) times daily as needed for flatulence.   Yes [provider]  sodium chloride 1 g tablet Take 1 g by mouth 3 (three) times daily.   Yes [provider]  sucralfate (CARAFATE) 1 g tablet Take 1 g by mouth 2 (two) times daily.    Yes [provider]  traZODone (DESYREL) 50 MG tablet Take 50 mg by mouth at bedtime.   Yes [provider]  enoxaparin (LOVENOX) 40 MG/0.4ML injection Inject 0.4 mLs (40 mg total) into the skin daily. Patient not taking: Reported on 12/20/2017 11/18/17   Georgette Shell, MD    Family History History reviewed. No pertinent family history.  Social History Social History   Tobacco Use  . Smoking status: Current Every Day Smoker    Packs/day: 0.25    Types: Cigarettes  . Smokeless tobacco: Never Used  Substance Use Topics  . Alcohol use: No  . Drug use: No     Allergies   Clarithromycin and  Penicillins   Review of Systems Review of Systems  Constitutional: Negative for appetite change.  HENT: Negative for congestion.   Respiratory: Positive for shortness of breath.   Cardiovascular: Negative for chest pain.  Genitourinary: Negative  for pelvic pain.  Musculoskeletal: Positive for back pain.       Right hip pain.  Skin: Negative for rash and wound.  Neurological: Positive for weakness.     Physical Exam Updated Vital Signs BP (!) 170/66 (BP Location: Right Arm)   Pulse (!) 59   Temp 97.9 F (36.6 C) (Oral)   Resp 17   Ht 5\' 5"  (1.651 m)   Wt 94.3 kg (208 lb)   SpO2 90%   BMI 34.61 kg/m   Physical Exam  Constitutional: She appears well-developed.  HENT:  Head: Normocephalic.  Eyes: Pupils are equal, round, and reactive to light.  Cardiovascular: Normal rate.  Pulmonary/Chest: She has no wheezes.  Abdominal: There is no tenderness.  Musculoskeletal: She exhibits edema.  Some edema bilateral lower extremities.  Has cushions on both feet.  Neurological: She is alert.  Skin: Skin is warm. Capillary refill takes less than 2 seconds.      ED Treatments / Results  Labs (all labs ordered are listed, but only abnormal results are displayed) Labs Reviewed  CBC WITH DIFFERENTIAL/PLATELET  I-STAT CHEM 8, ED    EKG None  Radiology No results found.  Procedures Procedures (including critical care time)  Medications Ordered in ED Medications  acetaminophen (TYLENOL) tablet 650 mg (has no administration in time range)  albuterol (PROVENTIL HFA;VENTOLIN HFA) 108 (90 Base) MCG/ACT inhaler 2 puff (has no administration in time range)  ALPRAZolam (XANAX) tablet 0.5 mg (has no administration in time range)  alum & mag hydroxide-simeth (MAALOX/MYLANTA) 200-200-20 MG/5ML suspension 30 mL (has no administration in time range)  aspirin EC tablet 81 mg (has no administration in time range)  buPROPion (WELLBUTRIN XL) 24 hr tablet 300 mg (has no administration in  time range)  carvedilol (COREG) tablet 25 mg (has no administration in time range)  cloNIDine (CATAPRES - Dosed in mg/24 hr) patch 0.3 mg (has no administration in time range)  dextromethorphan-guaiFENesin (MUCINEX DM) 30-600 MG per 12 hr tablet 1 tablet (has no administration in time range)  dicyclomine (BENTYL) tablet 20 mg (has no administration in time range)  divalproex (DEPAKOTE) DR tablet 250 mg (has no administration in time range)  enoxaparin (LOVENOX) injection 40 mg (has no administration in time range)  ferrous sulfate tablet 325 mg (has no administration in time range)  fluticasone (FLONASE) 50 MCG/ACT nasal spray 2 spray (has no administration in time range)  fluticasone furoate-vilanterol (BREO ELLIPTA) 100-25 MCG/INH 1 puff (has no administration in time range)  furosemide (LASIX) tablet 80 mg (has no administration in time range)  guaifenesin (ROBITUSSIN) 100 MG/5ML syrup 200 mg (has no administration in time range)  hydrALAZINE (APRESOLINE) tablet 100 mg (has no administration in time range)  hydrOXYzine (ATARAX/VISTARIL) tablet 25 mg (has no administration in time range)  ipratropium-albuterol (DUONEB) 0.5-2.5 (3) MG/3ML nebulizer solution 3 mL (has no administration in time range)  loperamide (IMODIUM) capsule 2 mg (has no administration in time range)  lurasidone (LATUDA) tablet 40 mg (has no administration in time range)  magnesium oxide (MAG-OX) tablet 800 mg (has no administration in time range)  Melatonin TABS 10 mg (has no administration in time range)  NIFEdipine (PROCARDIA-XL/ADALAT-CC/NIFEDICAL-XL) 24 hr tablet 90 mg (has no administration in time range)  pantoprazole (PROTONIX) EC tablet 40 mg (has no administration in time range)  oxyCODONE (Oxy IR/ROXICODONE) immediate release tablet 5 mg (has no administration in time range)  PARoxetine (PAXIL) tablet 20 mg (has no administration in time range)  polyethylene glycol (  MIRALAX / GLYCOLAX) packet 17 g (has no  administration in time range)  sodium chloride tablet 1 g (has no administration in time range)  simethicone (MYLICON) chewable tablet 80 mg (has no administration in time range)  pravastatin (PRAVACHOL) tablet 20 mg (has no administration in time range)  sucralfate (CARAFATE) tablet 1 g (has no administration in time range)  traZODone (DESYREL) tablet 50 mg (has no administration in time range)  polycarbophil (FIBERCON) tablet 625 mg (has no administration in time range)     Initial Impression / Assessment and Plan / ED Course  I have reviewed the triage vital signs and the nursing notes.  Pertinent labs & imaging results that were available during my care of the patient were reviewed by me and considered in my medical decision making (see chart for details).     Patient brought in from assisted living.  Patient was at skilled nursing but apparently insurance ran out.  Patient does not appear as if she will be able to go to an assisted living or back home.  She looks like she is nonambulatory and is on chronic oxygen.  Started on home medicines.  Will now add CBC and i-STAT Chem-8.  Has been seen by social work and discussed with care management.  Both will see patient again tomorrow.  Attempting to find placement.  Will stay in the ER overnight.  Final Clinical Impressions(s) / ED Diagnoses   Final diagnoses:  Chronic respiratory failure, unspecified whether with hypoxia or hypercapnia [facility])    ED Discharge Orders    None       Davonna Belling, MD 12/20/17 2317

## 2017-12-20 NOTE — ED Notes (Signed)
Meds off schedule due to pharmacy just finishing reconciling her medications

## 2017-12-21 ENCOUNTER — Other Ambulatory Visit: Payer: Self-pay

## 2017-12-21 DIAGNOSIS — J9610 Chronic respiratory failure, unspecified whether with hypoxia or hypercapnia: Secondary | ICD-10-CM | POA: Diagnosis not present

## 2017-12-21 MED ORDER — DIVALPROEX SODIUM 250 MG PO DR TAB
250.0000 mg | DELAYED_RELEASE_TABLET | Freq: Three times a day (TID) | ORAL | Status: DC
  Administered 2017-12-21 – 2017-12-22 (×5): 250 mg via ORAL
  Filled 2017-12-21 (×5): qty 1

## 2017-12-21 MED ORDER — LORAZEPAM 0.5 MG PO TABS
0.5000 mg | ORAL_TABLET | Freq: Two times a day (BID) | ORAL | Status: DC
  Administered 2017-12-21 – 2017-12-22 (×3): 0.5 mg via ORAL
  Filled 2017-12-21 (×3): qty 1

## 2017-12-21 MED ORDER — ALBUTEROL SULFATE (2.5 MG/3ML) 0.083% IN NEBU: 2.5000 mg | INHALATION_SOLUTION | RESPIRATORY_TRACT | Status: DC | PRN

## 2017-12-21 MED ORDER — FLUTICASONE FUROATE-VILANTEROL 100-25 MCG/INH IN AEPB
1.0000 | INHALATION_SPRAY | Freq: Every day | RESPIRATORY_TRACT | Status: DC
  Administered 2017-12-21 – 2017-12-22 (×2): 1 via RESPIRATORY_TRACT
  Filled 2017-12-21: qty 28

## 2017-12-21 MED ORDER — DM-GUAIFENESIN ER 30-600 MG PO TB12
1.0000 | ORAL_TABLET | Freq: Two times a day (BID) | ORAL | Status: DC | PRN
  Filled 2017-12-21: qty 1

## 2017-12-21 NOTE — ED Notes (Signed)
Bed: FT95 Expected date:  Expected time:  Means of arrival:  Comments: Venita Sheffield

## 2017-12-21 NOTE — Care Management Note (Addendum)
Case Management Note  Patient Details  Name: ZAMORA COLTON MRN: [id number] Date of Birth: May 04, 1961  Subjective/Objective:   Hx CHF, Pelvic Fracture, COPD                 Action/Plan: NCM contacted [facility], spoke to [employer], [employer]. States she will need blister on pt's foot address and HHRN to do dressing changes. [facility] delivered another oxygen concentrator on yesterday. Pt will need a hospital bed. Contacted [facility] for hospital bed, 3n1 bedside commode and hoyer lift. States they will be able to take pt back to [place] and provide care. She is receiving PT/OT at ALF. Cancelled pending PT/OT consult. She has RW and neb machine at ALF. Pt used [facility] for [facility] in the past.  Expected Discharge Date:                 Expected Discharge Plan:  Assisted Living / Rest Home  In-House Referral:  Clinical Social Work  Discharge planning Services  CM Consult  Post Acute Care Choice:  Home Health Choice offered to:  [facility] POA / Guardian  DME Arranged:  Hospital bed, Gel overlay, Other see comment DME Agency:  [place]:  NA [place] Agency:  NA  Status of Service:  Completed, signed off  If discussed at [place] of Stay Meetings, dates discussed:    Additional Comments:  Erenest Rasher, RN 12/21/2017, 2:10 PM

## 2017-12-21 NOTE — ED Notes (Signed)
Roxanne from [facility] states facility will not have hospital bed until 12-23-17.  Patient can return after that date. [phone]

## 2017-12-21 NOTE — ED Notes (Signed)
Dr. Tomi Bamberger informed of pt's b/p.  No new orders received.

## 2017-12-21 NOTE — ED Notes (Signed)
Pt has incision on right femur from surgical scar.  She has discoloration on left anterior leg, states she has had it for years.    Applied a dry dressing per MD on blister to right lateral foot. Blister is closed and no redness or pain noted.   Patient does not have any yeast or discoloration under breast area.

## 2017-12-21 NOTE — ED Provider Notes (Signed)
Patient was evaluated in the ED overnight and is awaiting return to her facility.  Sounds like she needs to get set up with oxygen and a hospital bed at the facility before she can return there along with making sure she is on her regular medications.  Patient is complaining of leg pain from a femur fracture she had sustained in the past.  She is also complaining of an upset stomach.  She says she has chronic stomach problems.  She breakfast here.  Still waiting to hear from case management regarding whether she is able to be returned to her facility.    Patient was seen by case management and they are working on getting the patient a hospital bed and oxygen back at her facility.  It sounds like they would take her back once these can be secured.   Hayden Rasmussen, MD 12/21/17 [phone]

## 2017-12-21 NOTE — Progress Notes (Signed)
Progress Note for the Evaluation of Need for a Hospital Bed Patient Name: ____[name]_______       DOB: ____[birth year]____________________________ Diagnosis Codes: _K21.9, Z87.81, S72.141A__        Height: _5'5_________        Weight: ____208 lbs______ CHF or Chronic Pulmonary Condition  Patient suffers from __CHF, COPD___ and has trouble breathing at night when head is elevated less than                                           (CHF or chronic pulmonary Dx)  _____30-45 degrees______. Bed wedges do not provide enough elevation to resolve breathing issues. ____Breathing difficult, shortness of breath_  (30 or more)                        (Relevant symptoms)   cause patient to require frequent and immediate changes in body position which cannot be achieved with a normal bed.

## 2017-12-22 DIAGNOSIS — J9610 Chronic respiratory failure, unspecified whether with hypoxia or hypercapnia: Secondary | ICD-10-CM | POA: Diagnosis not present

## 2017-12-22 MED ORDER — ONDANSETRON HCL 4 MG PO TABS
4.0000 mg | ORAL_TABLET | Freq: Three times a day (TID) | ORAL | Status: DC | PRN
  Administered 2017-12-22: 4 mg via ORAL
  Filled 2017-12-22: qty 1

## 2017-12-22 NOTE — Discharge Instructions (Signed)
You were evaluated in the emergency department because you were at a facility that did not have access to your medications or In appropriate hospital that her oxygen.  These of been arranged and now you are being returned to the facility.  It is important that you follow-up with your primary care doctor for continued management of your medical issues.

## 2017-12-22 NOTE — ED Notes (Signed)
Report called to [facility] at [facility]

## 2017-12-22 NOTE — Progress Notes (Addendum)
Discharge Planning:[facility] delivered hospital bed. Contacted [facility] with new referral. Updated CSW for dc back to ALF today.  Jonnie Finner RN CCM Case Mgmt phone [phone]  12/22/3017 1:50 pm NCM spoke to [facility] rep, and active with [facility]. They will do a resumption of care. Jonnie Finner RN CCM Case Mgmt phone [phone]

## 2017-12-22 NOTE — Progress Notes (Signed)
CSW contacted Mia at [facility] [phone]) in an effort to successfully discharge the patient back to her assisted living facility. CSW confirmed with weekend case manager that the patient's equipment had been delivered (oxygen and hospital bed). Mia stated that the facility is prepared to accept the patient at this time.   CSW contacted patient's nurse in [place], informed of readiness for discharge. Nurse will contact [facility] and finalize discharge process.    Stephanie Acre, [place] Social Worker [phone]

## 2017-12-24 ENCOUNTER — Encounter (HOSPITAL_COMMUNITY): Payer: Self-pay | Admitting: Emergency Medicine

## 2017-12-24 ENCOUNTER — Emergency Department (HOSPITAL_COMMUNITY)
Admission: EM | Admit: 2017-12-24 | Discharge: 2017-12-25 | Disposition: A | Discharge status: Home / Self Care | Payer: Medicare Other | Attending: Emergency Medicine | Admitting: Emergency Medicine

## 2017-12-24 ENCOUNTER — Emergency Department (HOSPITAL_COMMUNITY): Payer: Medicare Other

## 2017-12-24 ENCOUNTER — Other Ambulatory Visit: Payer: Self-pay

## 2017-12-24 DIAGNOSIS — E119 Type 2 diabetes mellitus without complications: Secondary | ICD-10-CM | POA: Insufficient documentation

## 2017-12-24 DIAGNOSIS — R0602 Shortness of breath: Secondary | ICD-10-CM

## 2017-12-24 DIAGNOSIS — R05 Cough: Secondary | ICD-10-CM | POA: Insufficient documentation

## 2017-12-24 DIAGNOSIS — Z7982 Long term (current) use of aspirin: Secondary | ICD-10-CM | POA: Insufficient documentation

## 2017-12-24 DIAGNOSIS — F17210 Nicotine dependence, cigarettes, uncomplicated: Secondary | ICD-10-CM | POA: Diagnosis not present

## 2017-12-24 DIAGNOSIS — I110 Hypertensive heart disease with heart failure: Secondary | ICD-10-CM | POA: Diagnosis not present

## 2017-12-24 DIAGNOSIS — J449 Chronic obstructive pulmonary disease, unspecified: Secondary | ICD-10-CM | POA: Diagnosis not present

## 2017-12-24 DIAGNOSIS — Z9889 Other specified postprocedural states: Secondary | ICD-10-CM | POA: Diagnosis not present

## 2017-12-24 DIAGNOSIS — R0789 Other chest pain: Secondary | ICD-10-CM | POA: Diagnosis not present

## 2017-12-24 DIAGNOSIS — Z79899 Other long term (current) drug therapy: Secondary | ICD-10-CM | POA: Diagnosis not present

## 2017-12-24 DIAGNOSIS — Z8544 Personal history of malignant neoplasm of other female genital organs: Secondary | ICD-10-CM | POA: Diagnosis not present

## 2017-12-24 DIAGNOSIS — Z7901 Long term (current) use of anticoagulants: Secondary | ICD-10-CM | POA: Insufficient documentation

## 2017-12-24 DIAGNOSIS — I509 Heart failure, unspecified: Secondary | ICD-10-CM | POA: Insufficient documentation

## 2017-12-24 DIAGNOSIS — F419 Anxiety disorder, unspecified: Secondary | ICD-10-CM | POA: Insufficient documentation

## 2017-12-24 DIAGNOSIS — J441 Chronic obstructive pulmonary disease with (acute) exacerbation: Secondary | ICD-10-CM

## 2017-12-24 LAB — I-STAT ARTERIAL BLOOD GAS, ED
ACID-BASE EXCESS: 9 mmol/L — AB (ref 0.0–2.0)
BICARBONATE: 34.3 mmol/L — AB (ref 20.0–28.0)
O2 SAT: 97 %
Patient temperature: 98.5
TCO2: 36 mmol/L — ABNORMAL HIGH (ref 22–32)
pCO2 arterial: 46.1 mmHg (ref 32.0–48.0)
pH, Arterial: 7.479 — ABNORMAL HIGH (ref 7.350–7.450)
pO2, Arterial: 84 mmHg (ref 83.0–108.0)

## 2017-12-24 LAB — CBC WITH DIFFERENTIAL/PLATELET
Abs Immature Granulocytes: 0 10*3/uL (ref 0.0–0.1)
Basophils Absolute: 0 10*3/uL (ref 0.0–0.1)
Basophils Relative: 0 %
EOS PCT: 0 %
Eosinophils Absolute: 0 10*3/uL (ref 0.0–0.7)
HEMATOCRIT: 35.8 % — AB (ref 36.0–46.0)
HEMOGLOBIN: 11.2 g/dL — AB (ref 12.0–15.0)
Immature Granulocytes: 0 %
LYMPHS ABS: 0.8 10*3/uL (ref 0.7–4.0)
LYMPHS PCT: 16 %
MCH: 27.8 pg (ref 26.0–34.0)
MCHC: 31.3 g/dL (ref 30.0–36.0)
MCV: 88.8 fL (ref 78.0–100.0)
Monocytes Absolute: 0.5 10*3/uL (ref 0.1–1.0)
Monocytes Relative: 10 %
Neutro Abs: 3.6 10*3/uL (ref 1.7–7.7)
Neutrophils Relative %: 74 %
Platelets: 182 10*3/uL (ref 150–400)
RBC: 4.03 MIL/uL (ref 3.87–5.11)
RDW: 13.7 % (ref 11.5–15.5)
WBC: 4.8 10*3/uL (ref 4.0–10.5)

## 2017-12-24 LAB — BASIC METABOLIC PANEL
Anion gap: 9 (ref 5–15)
BUN: 6 mg/dL (ref 6–20)
CHLORIDE: 92 mmol/L — AB (ref 98–111)
CO2: 32 mmol/L (ref 22–32)
Calcium: 9.1 mg/dL (ref 8.9–10.3)
Creatinine, Ser: 0.61 mg/dL (ref 0.44–1.00)
GFR calc Af Amer: >60 mL/min (ref >60)
GFR calc non Af Amer: >60 mL/min (ref >60)
Glucose, Bld: 111 mg/dL — ABNORMAL HIGH (ref 70–99)
POTASSIUM: 4 mmol/L (ref 3.5–5.1)
Sodium: 133 mmol/L — ABNORMAL LOW (ref 135–145)

## 2017-12-24 LAB — BRAIN NATRIURETIC PEPTIDE: B NATRIURETIC PEPTIDE 5: 240.8 pg/mL — AB (ref 0.0–100.0)

## 2017-12-24 LAB — D-DIMER, QUANTITATIVE: D-Dimer, Quant: 2.02 ug/mL-FEU — ABNORMAL HIGH (ref 0.00–0.50)

## 2017-12-24 LAB — I-STAT TROPONIN, ED: Troponin i, poc: 0 ng/mL (ref 0.00–0.08)

## 2017-12-24 MED ORDER — HYDROMORPHONE HCL 1 MG/ML IJ SOLN
1.0000 mg | Freq: Once | INTRAMUSCULAR | Status: AC
  Administered 2017-12-24: 1 mg via INTRAVENOUS
  Filled 2017-12-24: qty 1

## 2017-12-24 MED ORDER — SODIUM CHLORIDE 0.9 % IV SOLN
2000.0000 mg | Freq: Once | INTRAVENOUS | Status: DC
  Filled 2017-12-24: qty 2000

## 2017-12-24 MED ORDER — LEVOFLOXACIN 500 MG PO TABS
500.0000 mg | ORAL_TABLET | Freq: Once | ORAL | Status: AC
  Administered 2017-12-24: 500 mg via ORAL
  Filled 2017-12-24: qty 1

## 2017-12-24 MED ORDER — ALPRAZOLAM 0.25 MG PO TABS
1.0000 mg | ORAL_TABLET | Freq: Once | ORAL | Status: AC
  Administered 2017-12-24: 1 mg via ORAL
  Filled 2017-12-24: qty 4

## 2017-12-24 MED ORDER — HYDROXYZINE HCL 25 MG PO TABS
25.0000 mg | ORAL_TABLET | Freq: Three times a day (TID) | ORAL | Status: DC | PRN
  Administered 2017-12-24 (×2): 25 mg via ORAL
  Filled 2017-12-24 (×2): qty 1

## 2017-12-24 MED ORDER — HYDRALAZINE HCL 100 MG PO TABS: 100.0000 mg | ORAL_TABLET | Freq: Three times a day (TID) | ORAL | Status: DC

## 2017-12-24 MED ORDER — SODIUM CHLORIDE 0.9 % IV SOLN: 1.0000 g | Freq: Once | INTRAVENOUS | Status: DC

## 2017-12-24 MED ORDER — CARVEDILOL 12.5 MG PO TABS
25.0000 mg | ORAL_TABLET | Freq: Two times a day (BID) | ORAL | Status: DC
  Administered 2017-12-24 (×2): 25 mg via ORAL
  Filled 2017-12-24 (×2): qty 2

## 2017-12-24 MED ORDER — DEXAMETHASONE SODIUM PHOSPHATE 10 MG/ML IJ SOLN
10.0000 mg | Freq: Once | INTRAMUSCULAR | Status: AC
  Administered 2017-12-24: 10 mg via INTRAVENOUS
  Filled 2017-12-24: qty 1

## 2017-12-24 MED ORDER — LORAZEPAM 1 MG PO TABS
1.0000 mg | ORAL_TABLET | Freq: Once | ORAL | Status: AC
  Administered 2017-12-24: 1 mg via ORAL
  Filled 2017-12-24: qty 1

## 2017-12-24 MED ORDER — FENTANYL CITRATE (PF) 100 MCG/2ML IJ SOLN
50.0000 ug | Freq: Once | INTRAMUSCULAR | Status: AC
  Administered 2017-12-24: 50 ug via INTRAVENOUS
  Filled 2017-12-24: qty 2

## 2017-12-24 MED ORDER — IOPAMIDOL (ISOVUE-370) INJECTION 76%
INTRAVENOUS | Status: AC
  Filled 2017-12-24: qty 100

## 2017-12-24 MED ORDER — NIFEDIPINE ER OSMOTIC RELEASE 90 MG PO TB24
90.0000 mg | ORAL_TABLET | Freq: Every day | ORAL | Status: DC
  Administered 2017-12-24: 90 mg via ORAL
  Filled 2017-12-24: qty 1

## 2017-12-24 MED ORDER — IPRATROPIUM-ALBUTEROL 0.5-2.5 (3) MG/3ML IN SOLN
3.0000 mL | Freq: Once | RESPIRATORY_TRACT | Status: AC
  Administered 2017-12-24: 3 mL via RESPIRATORY_TRACT
  Filled 2017-12-24: qty 3

## 2017-12-24 MED ORDER — ALBUTEROL SULFATE HFA 108 (90 BASE) MCG/ACT IN AERS
2.0000 | INHALATION_SPRAY | Freq: Once | RESPIRATORY_TRACT | Status: AC
  Administered 2017-12-24: 2 via RESPIRATORY_TRACT
  Filled 2017-12-24: qty 6.7

## 2017-12-24 MED ORDER — LEVOFLOXACIN 750 MG PO TABS: 750.0000 mg | ORAL_TABLET | Freq: Every day | ORAL | 0 refills | Status: DC

## 2017-12-24 MED ORDER — HYDRALAZINE HCL 25 MG PO TABS
100.0000 mg | ORAL_TABLET | Freq: Once | ORAL | Status: AC
  Administered 2017-12-24: 100 mg via ORAL
  Filled 2017-12-24: qty 4

## 2017-12-24 MED ORDER — IOPAMIDOL (ISOVUE-370) INJECTION 76%
100.0000 mL | Freq: Once | INTRAVENOUS | Status: AC | PRN
  Administered 2017-12-24: 100 mL via INTRAVENOUS

## 2017-12-24 NOTE — ED Notes (Signed)
Patient transported to X-ray 

## 2017-12-24 NOTE — ED Notes (Addendum)
Pt speaking in much lass pressured voice. Requesting" something that will let me drop off to sleep". Informed she was given meds for anxiety and pain

## 2017-12-24 NOTE — ED Notes (Signed)
Chest pain and request for breathing treatment discussed with Legrand Como PA. Pt has clear lung sounds and satting 99 % on 2 l [place] but bp rising andc noted to Legrand Como.

## 2017-12-24 NOTE — ED Notes (Signed)
Pt c/o continued pain at chest  Body.

## 2017-12-24 NOTE — ED Notes (Signed)
Patient transported to CT 

## 2017-12-24 NOTE — Discharge Instructions (Addendum)
Contact a health care provider if: You have a worsening of your regular COPD symptoms. Get help right away if: You have worsening shortness of breath, even when resting. You have trouble talking. You have severe chest pain. You cough up blood. You have a fever. You have weakness, vomit repeatedly, or faint. You feel confused.

## 2017-12-24 NOTE — ED Notes (Signed)
Pt c/o continued chest tightness. Will inform provider.

## 2017-12-24 NOTE — ED Notes (Addendum)
Legrand Como PA states to give Coreg now.

## 2017-12-24 NOTE — ED Notes (Signed)
Pt placed on 2L of O2 for comfort. RA sats 92%

## 2017-12-24 NOTE — ED Notes (Signed)
Attempted report to [facility] 5 times. No answer

## 2017-12-24 NOTE — ED Triage Notes (Signed)
Per [facility] pt coming from [facility] for anxiety, chest pain, and shortness of breath onset of this morning. Patients roommate passed away through the night and upset the patient. Patient requested a breathing treatment, lung sounds clear and patient continuously on 2L  at facility. Pt given 324 aspirin and 2 nitro en route. 20G in left wrist saline locked.

## 2017-12-24 NOTE — ED Provider Notes (Signed)
[facility] EMERGENCY DEPARTMENT Provider Note   CSN: [id number] Arrival date & time: 12/24/17  1003     History   Chief Complaint Chief Complaint  Patient presents with  . Anxiety    HPI Natalie Thomas is a 57 y.o. female with a history of CHF, COPD, diabetes, hypertension, hyperlipidemia, recent right hip fracture status post repair who on 11/13/17 & was discharged from a SNF who recently was transferred to [facility] assisted living facility after consultation by social work and case manager with home oxygen & home medications presenting today to the emergency department today for anxiety, chest tightness and shortness of breath.  Patient was reported to have witnessed her roommates passing yesterday night including the CPR that was performed.  She notes since that time she has felt very anxious and had generalized chest tightness as well as shortness of breath.  She states she feels like she needs a albuterol nebulizer however this is different than her normal COPD exacerbations.  Patient reports that over the last 1.5 weeks she has been having ongoing substernal chest pain without radiation as well as a dry cough and shortness of breath.  She notes that this is intensified her symptoms.  She notes is not associate with nausea, emesis, diaphoresis.  She notes that her shortness of breath is not worsened by lying down.  She denies any lower extremity swelling.  Denies she was out of her home medications for the last several days but recently got them refilled and has been taking them as prescribed.  She denies prior history of MI.  She denies history of similar in the past.  She notes her pain is somewhat worsened with deep breathing.  The pain does not radiate to her back, jaw, neck, either shoulder.  She denies any abdominal pain.  The patient denies any visual changes, headache, focal weakness.  She has not had any interventions prior to arrival.  She does nothing makes  her symptoms better or worse.  She rates her current pain level as a 8/10.  She has not taken any of her home medications today and is concerned about her blood pressure rising. No fevers at home.    HPI  Past Medical History:  Diagnosis Date  . Acute on chronic respiratory failure with hypoxia and hypercapnia ([place]) 10/07/2017  . Anemia 10/16/2017  . Anxiety and depression   . CHF (congestive heart failure) ([place])   . Chronic hyponatremia 04/08/2007   Qualifier: Diagnosis of  By: Marca Ancona RMA, Lucy    . Chronic respiratory failure with hypoxia ([place]) 10/07/2017  . Closed comminuted intertrochanteric fracture of right femur ([place]) 11/07/2017  . COPD 04/08/2007   Qualifier: Diagnosis of  By: Marca Ancona RMA, Lucy    . Cough 05/08/2011  . Depression   . Diabetes mellitus, type 2 ([place])    pt denies but states that she has been treated for DM  . Dyspnea 10/16/2017  . Dysuria 04/08/2007   Qualifier: Diagnosis of  By: Reatha Armour, Lucy    . Essential hypertension 04/08/2007   Qualifier: Diagnosis of  By: Marca Ancona RMA, Lucy    . GERD (gastroesophageal reflux disease)   . HIP PAIN, LEFT 12/12/2007   Qualifier: Diagnosis of  By: Loanne Drilling MD, Jacelyn Pi   . Hyperlipidemia 10/07/2017  . Hypertension   . Hyponatremia 07/27/2011  . Incontinence of urine 08/14/2011  . Lumbar disc disease 04/09/2011  . MENOPAUSE, PREMATURE 04/08/2007   Qualifier: Diagnosis of  By:  Brand RMA, Lorre Nick    . 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 Osteoporosis   . Osteoporosis 04/08/2007   Qualifier: Diagnosis of  By: Reatha Armour, Lucy    . Polycythemia secondary to smoking 04/09/2011  . S/P right hip fracture 11/07/2017  . Schizoaffective disorder, bipolar type ([place]) 07/05/2011    Patient Active Problem List   Diagnosis Date Noted  . S/P right hip fracture 11/07/2017  . Closed comminuted intertrochanteric fracture of right femur ([place]) 11/07/2017  . Anxiety and depression   . Dyspnea  10/16/2017  . Anemia 10/16/2017  . COPD exacerbation ([place]) 10/07/2017  . Hyperlipidemia 10/07/2017  . Chronic respiratory failure with hypoxia ([place]) 10/07/2017  . Acute on chronic respiratory failure with hypoxia and hypercapnia ([place]) 10/07/2017  . Incontinence of urine 08/14/2011  . Hyponatremia 07/27/2011  . Schizoaffective disorder, bipolar type ([place]) 07/05/2011  . Cough 05/08/2011  . Encounter for long-term (current) use of other medications 04/09/2011  . Lumbar disc disease 04/09/2011  . Polycythemia secondary to smoking 04/09/2011  . HIP PAIN, LEFT 12/12/2007  . 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 DIABETES MELLITUS, TYPE II 04/08/2007  . MENOPAUSE, PREMATURE 04/08/2007  . Chronic hyponatremia 04/08/2007  . Schizoaffective disorder ([place]) 04/08/2007  . DEPRESSION 04/08/2007  . Essential hypertension 04/08/2007  . COPD 04/08/2007  . GERD 04/08/2007  . Osteoporosis 04/08/2007  . DYSURIA 04/08/2007    Past Surgical History:  Procedure Laterality Date  . EYE SURGERY     on left  eye, pt states that she sees bad out of the right eye and needs surgery there  . INTRAMEDULLARY (IM) NAIL INTERTROCHANTERIC Right 11/13/2017   Procedure: INTRAMEDULLARY (IM) NAIL INTERTROCHANTRIC;  Surgeon: Shona Needles, MD;  Location: [facility];  Service: Orthopedics;  Laterality: Right;  . PELVIC FRACTURE SURGERY       OB History   None      Home Medications    Prior to Admission medications   Medication Sig Start Date End Date Taking? Authorizing Provider  acetaminophen (TYLENOL) 325 MG tablet Take 650 mg by mouth every 6 (six) hours as needed for mild pain.    [provider]  albuterol (PROVENTIL HFA;VENTOLIN HFA) 108 (90 Base) MCG/ACT inhaler Inhale 2 puffs into the lungs every 4 (four) hours as needed for wheezing or shortness of breath. 07/05/17   Ward, Delice Bison, DO  ALPRAZolam Duanne Moron) 0.5 MG tablet Take 1 tablet (0.5 mg total) by mouth 3 (three) times daily. 11/18/17    Georgette Shell, MD  alum & mag hydroxide-simeth ([place]) 200-200-20 MG/5ML suspension Take 30 mLs by mouth every 6 (six) hours as needed for indigestion or heartburn.    [provider]  aspirin 81 MG tablet Take 1 tablet (81 mg total) by mouth daily. For heart health 08/06/11   Greig Castilla, FNP  buPROPion (WELLBUTRIN XL) 300 MG 24 hr tablet Take 1 tablet (300 mg total) by mouth every morning. Patient taking differently: Take 300 mg by mouth every morning.  10/19/17 10/19/18  Aline [facility], MD  carvedilol (COREG) 25 MG tablet Take 25 mg by mouth 2 (two) times daily with a meal.    [provider]  cloNIDine (CATAPRES - DOSED IN MG/24 HR) 0.3 mg/24hr patch Place 0.3 mg onto the skin once a week. On Monday    [provider]  dextromethorphan-guaiFENesin [facility] DM) 30-600 MG 12hr tablet Take 1 tablet by mouth every  12 (twelve) hours as needed for cough.    [provider]  dicyclomine (BENTYL) 20 MG tablet Take 1 tablet (20 mg total) by mouth 2 (two) times daily. 11/04/17   Recardo Evangelist, PA-C  divalproex (DEPAKOTE) 250 MG DR tablet Take 1 tablet (250 mg total) by mouth every 12 (twelve) hours. Patient taking differently: Take 250 mg by mouth 3 (three) times daily.  08/12/17   Nita Sells, MD  enoxaparin (LOVENOX) 40 MG/0.4ML injection Inject 0.4 mLs (40 mg total) into the skin daily. Patient not taking: Reported on 12/20/2017 11/18/17   Georgette Shell, MD  ferrous sulfate 325 (65 FE) MG tablet Take 325 mg by mouth daily with breakfast.    [provider]  fluticasone (FLONASE) 50 MCG/ACT nasal spray Place 2 sprays into both nostrils 2 (two) times daily.    [provider]  fluticasone furoate-vilanterol (BREO ELLIPTA) 100-25 MCG/INH AEPB Inhale 1 puff into the lungs daily.    [provider]  furosemide (LASIX) 80 MG tablet Take 80 mg by mouth 2 (two) times daily.     [provider]  guaifenesin  (ROBITUSSIN) 100 MG/5ML syrup Take 200 mg by mouth 3 (three) times daily as needed for cough.    [provider]  hydrALAZINE (APRESOLINE) 100 MG tablet Take 100 mg by mouth 3 (three) times daily.    [provider]  hydrOXYzine (ATARAX/VISTARIL) 25 MG tablet Take 1 tablet (25 mg total) by mouth 3 (three) times daily as needed for anxiety. 10/19/17   Aline [facility], MD  ipratropium-albuterol (DUONEB) 0.5-2.5 (3) MG/3ML SOLN Take 3 mLs by nebulization 3 (three) times daily.     [provider]  loperamide (IMODIUM A-D) 2 MG tablet Take 2 mg by mouth every 4 (four) hours as needed for diarrhea or loose stools.    [provider]  lurasidone (LATUDA) 80 MG TABS tablet Take 1 tablet (80 mg total) by mouth daily with breakfast. Patient taking differently: Take 40 mg by mouth daily with breakfast.  08/12/17   Nita Sells, MD  magnesium oxide (MAG-OX) 400 (241.3 Mg) MG tablet Take 2 tablets (800 mg total) by mouth 2 (two) times daily. 08/12/17   Nita Sells, MD  Melatonin 10 MG TABS Take 10 mg by mouth at bedtime.    [provider]  Menthol, Topical Analgesic, (BIOFREEZE COLORLESS EX) Apply 1 application topically 3 (three) times daily. TO UPPER ABDOMEN    [provider]  Methylcellulose, Laxative, 500 MG TABS Take 500 mg by mouth at bedtime.    [provider]  Multiple Vitamin (DAILY-VITE PO) Take 1 tablet by mouth daily.    [provider]  NIFEdipine (PROCARDIA XL/ADALAT-CC) 90 MG 24 hr tablet Take 90 mg by mouth daily.    [provider]  omeprazole (PRILOSEC) 20 MG capsule Take 1 capsule (20 mg total) by mouth 2 (two) times daily before a meal. For acid reflux. 10/19/17   Aline [facility], MD  oxycodone (OXY-IR) 5 MG capsule Take 5 mg by mouth 4 (four) times daily.    [provider]  PARoxetine (PAXIL) 20 MG tablet Take 1 tablet (20 mg total) by mouth daily. 10/19/17   Aline [facility], MD    polyethylene glycol (MIRALAX) packet Take 17 g by mouth daily as needed. 10/19/17   Aline [facility], MD  pravastatin (PRAVACHOL) 20 MG tablet Take 20 mg by mouth daily.    [provider]  promethazine (PHENERGAN) 25 MG/ML injection Inject  25 mg into the vein every 6 (six) hours as needed for nausea or vomiting.    [provider]  simethicone (MYLICON) 80 MG chewable tablet Chew 80 mg by mouth 3 (three) times daily as needed for flatulence.    [provider]  sodium chloride 1 g tablet Take 1 g by mouth 3 (three) times daily.    [provider]  sucralfate (CARAFATE) 1 g tablet Take 1 g by mouth 2 (two) times daily.     [provider]  traZODone (DESYREL) 50 MG tablet Take 50 mg by mouth at bedtime.    [provider]    Family History No family history on file.  Social History Social History   Tobacco Use  . Smoking status: Current Every Day Smoker    Packs/day: 0.25    Types: Cigarettes  . Smokeless tobacco: Never Used  Substance Use Topics  . Alcohol use: No  . Drug use: No     Allergies   Clarithromycin and Penicillins   Review of Systems Review of Systems  All other systems reviewed and are negative.    Physical Exam Updated Vital Signs BP (!) 174/71   Pulse (!) 57   Temp 98.5 F (36.9 C) (Oral)   Resp 14   Ht 5\' 5"  (1.651 m)   Wt 94.3 kg (208 lb)   SpO2 98%   BMI 34.61 kg/m   Physical Exam  Constitutional: She appears well-developed and well-nourished.  HENT:  Head: Normocephalic and atraumatic.  Right Ear: External ear normal.  Left Ear: External ear normal.  Nose: Nose normal.  Mouth/Throat: Uvula is midline, oropharynx is clear and moist and mucous membranes are normal. No tonsillar exudate.  Eyes: Pupils are equal, round, and reactive to light. Right eye exhibits no discharge. Left eye exhibits no discharge. No scleral icterus.  Neck: Trachea normal. Neck supple. No spinous process tenderness  present. No neck rigidity. Normal range of motion present.  Cardiovascular: Normal rate, regular rhythm and intact distal pulses.  No murmur heard. Pulses:      Radial pulses are 2+ on the right side, and 2+ on the left side.       Dorsalis pedis pulses are 2+ on the right side, and 2+ on the left side.       Posterior tibial pulses are 2+ on the right side, and 2+ on the left side.  No lower extremity swelling or edema. Calves symmetric in size bilaterally.  Pulmonary/Chest: Effort normal and breath sounds normal. She exhibits no tenderness.  Patient on 2 L O2 at baseline satting at 98% with good waveform on monitor.  Patient able to speak in full sentences without difficulty.  There is no tachypnea, accessory muscle use or pursed lip breathing.  No evidence of respiratory distress.  There is equal rise and fall of chest.  Lungs are clear to auscultation bilaterally.  Abdominal: Soft. Bowel sounds are normal. She exhibits no distension. There is no tenderness. There is no rigidity, no rebound, no guarding and no CVA tenderness.  Musculoskeletal: She exhibits no edema.  Lymphadenopathy:    She has no cervical adenopathy.  Neurological: She is alert.  Skin: Skin is warm and dry. No rash noted. She is not diaphoretic.  Psychiatric: She has a normal mood and affect.  Nursing note and vitals reviewed.    ED Treatments / Results  Labs (all labs ordered are listed, but only abnormal results are displayed) Labs Reviewed  BASIC METABOLIC  PANEL - Abnormal; Notable for the following components:      Result Value   Sodium 133 (*)    Chloride 92 (*)    Glucose, Bld 111 (*)    All other components within normal limits  CBC WITH DIFFERENTIAL/PLATELET - Abnormal; Notable for the following components:   Hemoglobin 11.2 (*)    HCT 35.8 (*)    All other components within normal limits  D-DIMER, QUANTITATIVE (NOT AT [facility]) - Abnormal; Notable for the following components:   D-Dimer, Quant 2.02 (*)      All other components within normal limits  BRAIN NATRIURETIC PEPTIDE - Abnormal; Notable for the following components:   B Natriuretic Peptide 240.8 (*)    All other components within normal limits  I-STAT TROPONIN, ED    EKG EKG Interpretation  Date/Time:  Tuesday December 24 2017 10:11:03 EDT Ventricular Rate:  61 PR Interval:    QRS Duration: 77 QT Interval:  436 QTC Calculation: 440 R Axis:   53 Text Interpretation:  Sinus rhythm No significant change since last tracing Confirmed by Blanchie Dessert [id number]) on 12/24/2017 10:21:17 AM   Radiology Dg Chest 2 View  Result Date: 12/24/2017 CLINICAL DATA:  Chest pain and shortness of breath EXAM: CHEST - 2 VIEW COMPARISON:  11/14/2017 FINDINGS: Cardiac shadow is stable. Endotracheal tube and nasogastric catheter have been removed in the interval. The lungs are well aerated without focal infiltrate. Minimal blunting is noted posteriorly consistent with a small effusion. Multilevel compression deformities are noted throughout the thoracic spine stable from multiple previous exams. IMPRESSION: Small pleural effusion posteriorly. Chronic compression deformities in the thoracic spine. No acute infiltrate is seen. Electronically Signed   By: Inez Catalina M.D.   On: 12/24/2017 11:57    Procedures Procedures (including critical care time)  Medications Ordered in ED Medications  carvedilol (COREG) tablet 25 mg (25 mg Oral Given 12/24/17 1243)  hydrOXYzine (ATARAX/VISTARIL) tablet 25 mg (25 mg Oral Given 12/24/17 1243)  NIFEdipine (PROCARDIA XL/ADALAT-CC) 24 hr tablet 90 mg (0 mg Oral Hold 12/24/17 1509)  LORazepam (ATIVAN) tablet 1 mg (1 mg Oral Given 12/24/17 1124)  fentaNYL (SUBLIMAZE) injection 50 mcg (50 mcg Intravenous Given 12/24/17 1246)  hydrALAZINE (APRESOLINE) tablet 100 mg (100 mg Oral Given 12/24/17 1340)  HYDROmorphone (DILAUDID) injection 1 mg (1 mg Intravenous Given 12/24/17 1418)  albuterol (PROVENTIL HFA;VENTOLIN HFA) 108 (90 Base) MCG/ACT  inhaler 2 puff (2 puffs Inhalation Given 12/24/17 1527)     Initial Impression / Assessment and Plan / ED Course  I have reviewed the triage vital signs and the nursing notes.  Pertinent labs & imaging results that were available during my care of the patient were reviewed by me and considered in my medical decision making (see chart for details).     57 y.o. with a history of CHF, COPD, diabetes, hypertension, hyperlipidemia, recent right hip fracture status post repair who on 11/13/17 & was discharged from a SNF who recently was transferred to [facility] assisted living facility after consultation by social work and case manager with home oxygen & home medications presenting today to the emergency department today for anxiety, chest tightness and shortness of breath.  Patient was reported to have witnessed her roommates passing yesterday night including the CPR that was performed.  She notes since that time she has felt very anxious and had generalized chest tightness as well as shortness of breath.  She states she feels like she needs a albuterol nebulizer however this is  different than her normal COPD exacerbations.  Patient reports that over the last 1.5 weeks she has been having ongoing substernal chest pain without radiation as well as a dry cough and shortness of breath.  She notes that this is intensified her symptoms.  She notes is not associate with nausea, emesis, diaphoresis.    Patient vital signs are reassuring on presentation.  She is slightly hypertensive.  Will order home blood pressure medications.  Patient is without hypoxia.  She is on home 2 L of O2 satting at greater than 95% on room air.  No fever, tachycardia, tachypnea or hypotension.  Lungs are clear to auscultation bilaterally.  Heart is regular rate and rhythm.  Do not feel patient needs nebulizer at this current time.  Will order Ativan and hydroxyzine to see if this resolves patient's symptoms.  Patient symptoms not  resolved with Ativan and hydroxyzine.  Blood pressure did rise for period time but has now improved after home blood pressure medications.  She notes she has persistent chest pain shortness of breath.  Patient's chest x-ray with no pleural effusion from prior chest x-ray.  No infiltrate or pulmonary edema.  There is no leukocytosis.  No acute kidney injury.  Troponin within normal limits.  EKG with normal sinus rhythm.  No evidence of STEMI.  After pain medication symptoms not resolved.  Patient given albuterol inhaler without relief.  Will order d-dimer given patient's recent surgery to evaluate for PE.    D-dimer is positive.  Patient will require CTA to evaluate.  With CTA pending, case signed out to Doreen Salvage, PA-C. Patient appears safe for handoff and updated on plan.   Patient case discussed with Dr. Maryan Rued who is in agreement with plan.  Final Clinical Impressions(s) / ED Diagnoses   Final diagnoses:  Shortness of breath    ED Discharge Orders    None       Lorelle Gibbs 12/24/17 1618    Blanchie Dessert, MD 12/25/17 [phone]

## 2017-12-25 NOTE — ED Notes (Signed)
[facility] taking patient back to [facility]

## 2018-02-27 ENCOUNTER — Emergency Department (HOSPITAL_COMMUNITY)
Admission: EM | Admit: 2018-02-27 | Discharge: 2018-02-28 | Disposition: A | Discharge status: Home / Self Care | Payer: Medicare Other | Attending: Emergency Medicine | Admitting: Emergency Medicine

## 2018-02-27 ENCOUNTER — Emergency Department (HOSPITAL_COMMUNITY): Payer: Medicare Other

## 2018-02-27 ENCOUNTER — Other Ambulatory Visit: Payer: Self-pay

## 2018-02-27 ENCOUNTER — Encounter (HOSPITAL_COMMUNITY): Payer: Self-pay | Admitting: Emergency Medicine

## 2018-02-27 DIAGNOSIS — F250 Schizoaffective disorder, bipolar type: Secondary | ICD-10-CM | POA: Diagnosis not present

## 2018-02-27 DIAGNOSIS — R0602 Shortness of breath: Secondary | ICD-10-CM | POA: Insufficient documentation

## 2018-02-27 DIAGNOSIS — Z7689 Persons encountering health services in other specified circumstances: Secondary | ICD-10-CM | POA: Diagnosis not present

## 2018-02-27 DIAGNOSIS — Z79899 Other long term (current) drug therapy: Secondary | ICD-10-CM | POA: Insufficient documentation

## 2018-02-27 LAB — URINALYSIS, ROUTINE W REFLEX MICROSCOPIC
Bacteria, UA: NONE SEEN
Bilirubin Urine: NEGATIVE
GLUCOSE, UA: NEGATIVE mg/dL
HGB URINE DIPSTICK: NEGATIVE
Ketones, ur: NEGATIVE mg/dL
LEUKOCYTES UA: NEGATIVE
Nitrite: NEGATIVE
PH: 8 (ref 5.0–8.0)
Protein, ur: NEGATIVE mg/dL
Specific Gravity, Urine: 1.008 (ref 1.005–1.030)

## 2018-02-27 LAB — BASIC METABOLIC PANEL
ANION GAP: 10 (ref 5–15)
BUN: 8 mg/dL (ref 6–20)
CALCIUM: 9.2 mg/dL (ref 8.9–10.3)
CO2: 29 mmol/L (ref 22–32)
Chloride: 94 mmol/L — ABNORMAL LOW (ref 98–111)
Creatinine, Ser: 0.62 mg/dL (ref 0.44–1.00)
Glucose, Bld: 75 mg/dL (ref 70–99)
Potassium: 3.9 mmol/L (ref 3.5–5.1)
Sodium: 133 mmol/L — ABNORMAL LOW (ref 135–145)

## 2018-02-27 LAB — CBC WITH DIFFERENTIAL/PLATELET
BASOS ABS: 0 10*3/uL (ref 0.0–0.1)
BASOS PCT: 0 %
EOS PCT: 1 %
Eosinophils Absolute: 0.1 10*3/uL (ref 0.0–0.7)
HCT: 37 % (ref 36.0–46.0)
Hemoglobin: 12.2 g/dL (ref 12.0–15.0)
LYMPHS PCT: 35 %
Lymphs Abs: 1.6 10*3/uL (ref 0.7–4.0)
MCH: 28.4 pg (ref 26.0–34.0)
MCHC: 33 g/dL (ref 30.0–36.0)
MCV: 86.2 fL (ref 78.0–100.0)
MONO ABS: 0.3 10*3/uL (ref 0.1–1.0)
MONOS PCT: 8 %
NEUTROS ABS: 2.4 10*3/uL (ref 1.7–7.7)
Neutrophils Relative %: 56 %
PLATELETS: 201 10*3/uL (ref 150–400)
RBC: 4.29 MIL/uL (ref 3.87–5.11)
RDW: 15.5 % (ref 11.5–15.5)
WBC: 4.4 10*3/uL (ref 4.0–10.5)

## 2018-02-27 LAB — SALICYLATE LEVEL

## 2018-02-27 LAB — I-STAT TROPONIN, ED
Troponin i, poc: 0.02 ng/mL (ref 0.00–0.08)
Troponin i, poc: 0.02 ng/mL (ref 0.00–0.08)

## 2018-02-27 LAB — D-DIMER, QUANTITATIVE (NOT AT ARMC): D DIMER QUANT: 0.79 ug{FEU}/mL — AB (ref 0.00–0.50)

## 2018-02-27 LAB — ETHANOL: Alcohol, Ethyl (B): <10 mg/dL (ref <10)

## 2018-02-27 LAB — RAPID URINE DRUG SCREEN, HOSP PERFORMED
Amphetamines: NOT DETECTED
Barbiturates: NOT DETECTED
Benzodiazepines: POSITIVE — AB
Cocaine: NOT DETECTED
OPIATES: NOT DETECTED
TETRAHYDROCANNABINOL: NOT DETECTED

## 2018-02-27 LAB — ACETAMINOPHEN LEVEL

## 2018-02-27 LAB — BRAIN NATRIURETIC PEPTIDE: B Natriuretic Peptide: 138.2 pg/mL — ABNORMAL HIGH (ref 0.0–100.0)

## 2018-02-27 MED ORDER — ACETAMINOPHEN 325 MG PO TABS: 650.0000 mg | ORAL_TABLET | Freq: Four times a day (QID) | ORAL | Status: DC | PRN

## 2018-02-27 MED ORDER — PRAVASTATIN SODIUM 20 MG PO TABS: 20.0000 mg | ORAL_TABLET | Freq: Every day | ORAL | Status: DC

## 2018-02-27 MED ORDER — CARVEDILOL 25 MG PO TABS
25.0000 mg | ORAL_TABLET | Freq: Two times a day (BID) | ORAL | Status: DC
  Filled 2018-02-27: qty 1

## 2018-02-27 MED ORDER — ASPIRIN 81 MG PO CHEW
324.0000 mg | CHEWABLE_TABLET | Freq: Once | ORAL | Status: AC
  Administered 2018-02-27: 324 mg via ORAL
  Filled 2018-02-27: qty 4

## 2018-02-27 MED ORDER — GABAPENTIN 100 MG PO CAPS
100.0000 mg | ORAL_CAPSULE | Freq: Two times a day (BID) | ORAL | Status: DC
  Administered 2018-02-28: 100 mg via ORAL
  Filled 2018-02-27: qty 1

## 2018-02-27 MED ORDER — BUPROPION HCL ER (XL) 150 MG PO TB24: 300.0000 mg | ORAL_TABLET | ORAL | Status: DC

## 2018-02-27 MED ORDER — MAGNESIUM OXIDE 400 (241.3 MG) MG PO TABS: 800.0000 mg | ORAL_TABLET | Freq: Every day | ORAL | Status: DC

## 2018-02-27 MED ORDER — TRAMADOL HCL 50 MG PO TABS
50.0000 mg | ORAL_TABLET | Freq: Three times a day (TID) | ORAL | Status: DC
  Administered 2018-02-27: 50 mg via ORAL
  Filled 2018-02-27: qty 1

## 2018-02-27 MED ORDER — TRAZODONE HCL 100 MG PO TABS
100.0000 mg | ORAL_TABLET | Freq: Every day | ORAL | Status: DC
  Administered 2018-02-28: 100 mg via ORAL
  Filled 2018-02-27: qty 1

## 2018-02-27 MED ORDER — ASPIRIN 81 MG PO CHEW: 81.0000 mg | CHEWABLE_TABLET | Freq: Every day | ORAL | Status: DC

## 2018-02-27 MED ORDER — NIFEDIPINE ER OSMOTIC RELEASE 60 MG PO TB24: 90.0000 mg | ORAL_TABLET | Freq: Every day | ORAL | Status: DC

## 2018-02-27 MED ORDER — HYDRALAZINE HCL 50 MG PO TABS
100.0000 mg | ORAL_TABLET | Freq: Three times a day (TID) | ORAL | Status: DC
  Administered 2018-02-27: 100 mg via ORAL
  Filled 2018-02-27: qty 2

## 2018-02-27 MED ORDER — IPRATROPIUM-ALBUTEROL 0.5-2.5 (3) MG/3ML IN SOLN
3.0000 mL | Freq: Once | RESPIRATORY_TRACT | Status: DC
  Filled 2018-02-27: qty 3

## 2018-02-27 MED ORDER — POLYETHYLENE GLYCOL 3350 17 G PO PACK: 17.0000 g | PACK | Freq: Every day | ORAL | Status: DC | PRN

## 2018-02-27 MED ORDER — FLUTICASONE FUROATE-VILANTEROL 100-25 MCG/INH IN AEPB
1.0000 | INHALATION_SPRAY | Freq: Every day | RESPIRATORY_TRACT | Status: DC
  Filled 2018-02-27: qty 28

## 2018-02-27 MED ORDER — PAROXETINE HCL 20 MG PO TABS: 20.0000 mg | ORAL_TABLET | Freq: Every day | ORAL | Status: DC

## 2018-02-27 MED ORDER — METHYLPREDNISOLONE SODIUM SUCC 125 MG IJ SOLR
80.0000 mg | Freq: Once | INTRAMUSCULAR | Status: AC
  Administered 2018-02-27: 80 mg via INTRAVENOUS
  Filled 2018-02-27: qty 2

## 2018-02-27 MED ORDER — FLUTICASONE PROPIONATE 50 MCG/ACT NA SUSP
2.0000 | Freq: Two times a day (BID) | NASAL | Status: DC
  Filled 2018-02-27: qty 16

## 2018-02-27 MED ORDER — DIVALPROEX SODIUM 250 MG PO DR TAB
250.0000 mg | DELAYED_RELEASE_TABLET | Freq: Three times a day (TID) | ORAL | Status: DC
  Administered 2018-02-28: 250 mg via ORAL
  Filled 2018-02-27: qty 1

## 2018-02-27 MED ORDER — IOPAMIDOL (ISOVUE-370) INJECTION 76%
INTRAVENOUS | Status: AC
  Filled 2018-02-27: qty 100

## 2018-02-27 MED ORDER — IOPAMIDOL (ISOVUE-370) INJECTION 76%
100.0000 mL | Freq: Once | INTRAVENOUS | Status: AC | PRN
  Administered 2018-02-27: 100 mL via INTRAVENOUS

## 2018-02-27 MED ORDER — LURASIDONE HCL 40 MG PO TABS
80.0000 mg | ORAL_TABLET | Freq: Every day | ORAL | Status: DC
  Filled 2018-02-27: qty 2

## 2018-02-27 MED ORDER — PANTOPRAZOLE SODIUM 40 MG PO TBEC: 40.0000 mg | DELAYED_RELEASE_TABLET | Freq: Every day | ORAL | Status: DC

## 2018-02-27 MED ORDER — SODIUM CHLORIDE 1 G PO TABS
1.0000 g | ORAL_TABLET | Freq: Three times a day (TID) | ORAL | Status: DC
  Administered 2018-02-27: 1 g via ORAL
  Filled 2018-02-27: qty 1

## 2018-02-27 MED ORDER — OXYCODONE HCL 5 MG PO TABS
5.0000 mg | ORAL_TABLET | Freq: Four times a day (QID) | ORAL | Status: DC
  Administered 2018-02-27: 5 mg via ORAL
  Filled 2018-02-27: qty 1

## 2018-02-27 MED ORDER — IPRATROPIUM-ALBUTEROL 0.5-2.5 (3) MG/3ML IN SOLN
3.0000 mL | Freq: Three times a day (TID) | RESPIRATORY_TRACT | Status: DC
  Administered 2018-02-27: 3 mL via RESPIRATORY_TRACT
  Filled 2018-02-27: qty 3

## 2018-02-27 MED ORDER — FENTANYL CITRATE (PF) 100 MCG/2ML IJ SOLN
50.0000 ug | Freq: Once | INTRAMUSCULAR | Status: AC
  Administered 2018-02-27: 50 ug via INTRAVENOUS
  Filled 2018-02-27: qty 2

## 2018-02-27 MED ORDER — ALPRAZOLAM 0.5 MG PO TABS
0.5000 mg | ORAL_TABLET | Freq: Three times a day (TID) | ORAL | Status: DC
  Administered 2018-02-27: 0.5 mg via ORAL
  Filled 2018-02-27: qty 1

## 2018-02-27 MED ORDER — DICYCLOMINE HCL 20 MG PO TABS
20.0000 mg | ORAL_TABLET | Freq: Two times a day (BID) | ORAL | Status: DC
  Administered 2018-02-27: 20 mg via ORAL
  Filled 2018-02-27: qty 1

## 2018-02-27 MED ORDER — SUCRALFATE 1 G PO TABS
1.0000 g | ORAL_TABLET | Freq: Two times a day (BID) | ORAL | Status: DC
  Administered 2018-02-27: 1 g via ORAL
  Filled 2018-02-27: qty 1

## 2018-02-27 MED ORDER — FERROUS SULFATE 325 (65 FE) MG PO TABS
325.0000 mg | ORAL_TABLET | Freq: Every day | ORAL | Status: DC
  Filled 2018-02-27: qty 1

## 2018-02-27 MED ORDER — IPRATROPIUM-ALBUTEROL 0.5-2.5 (3) MG/3ML IN SOLN
3.0000 mL | Freq: Once | RESPIRATORY_TRACT | Status: AC
  Administered 2018-02-27: 3 mL via RESPIRATORY_TRACT
  Filled 2018-02-27: qty 3

## 2018-02-27 MED ORDER — FUROSEMIDE 40 MG PO TABS: 40.0000 mg | ORAL_TABLET | Freq: Two times a day (BID) | ORAL | Status: DC

## 2018-02-27 NOTE — ED Triage Notes (Signed)
Pt c/o SOB x 2 days. Denies that she got her 3x day breathing tx for 2 days now. Staff says she definitely got her breathing tx. She usually is on 2L O2 and is at 99%. Staff at [facility] says she has been suffering from schizophrenic episodes.

## 2018-02-27 NOTE — Discharge Instructions (Signed)
Please follow up with your primary care provider within 5-7 days for re-evaluation of your symptoms. If you do not have a primary care provider, information for a healthcare clinic has been provided for you to make arrangements for follow up care. Please return to the emergency department for any new or worsening symptoms. ° °

## 2018-02-27 NOTE — ED Notes (Signed)
ED Provider at bedside. 

## 2018-02-27 NOTE — ED Notes (Signed)
Patient transported to CT 

## 2018-02-27 NOTE — ED Notes (Signed)
Bed: WA09 Expected date:  Expected time:  Means of arrival:  Comments: EMS-COPD

## 2018-02-27 NOTE — ED Provider Notes (Signed)
[facility] DEPT Provider Note   CSN: [id number] Arrival date & time: 02/27/18  1711     History   Chief Complaint Chief Complaint  Patient presents with  . Shortness of Breath  . Psychiatric Evaluation    HPI Natalie Thomas is a 57 y.o. female.  HPI   Patient is a 57 year old female with a history of CHF, COPD, cough, T2DM, depression/anxiety, hypertension, hyperlipidemia, schizoaffective disorder, who presents the emergency department today for evaluation of shortness of breath which she states has been present for the last 2 days.  She also notes a productive cough for the last 1 to 2 weeks.  States that "I hurt all over".  She also complains of midsternal chest tightness, though denies specific chest pain. States it hurts to take a deep breath. Patient states that she is supposed to take 3 nebulizer treatments per day and she has not received any of them for the last 2 days.   According to EMS who spoke with staff at [facility]. staff reported the patient had had her scheduled breathing treatments for the last several days as normal.  States that she is normally on 2 L O2.  Staff at [facility] noted that patient has been having more frequent schizophrenic episodes.  Patient is a difficult historian.  Reviewed records. Pt was seen on 12/24/17 and had very thorough workup including CTA of the chest that was negative for PE.   Past Medical History:  Diagnosis Date  . Acute on chronic respiratory failure with hypoxia and hypercapnia ([place]) 10/07/2017  . Anemia 10/16/2017  . Anxiety and depression   . CHF (congestive heart failure) ([place])   . Chronic hyponatremia 04/08/2007   Qualifier: Diagnosis of  By: Marca Ancona RMA, Lucy    . Chronic respiratory failure with hypoxia ([place]) 10/07/2017  . Closed comminuted intertrochanteric fracture of right femur ([place]) 11/07/2017  . COPD 04/08/2007   Qualifier: Diagnosis of  By: Marca Ancona RMA, Lucy    . Cough  05/08/2011  . Depression   . Diabetes mellitus, type 2 ([place])    pt denies but states that she has been treated for DM  . Dyspnea 10/16/2017  . Dysuria 04/08/2007   Qualifier: Diagnosis of  By: Reatha Armour, Lucy    . Essential hypertension 04/08/2007   Qualifier: Diagnosis of  By: Marca Ancona RMA, Lucy    . GERD (gastroesophageal reflux disease)   . HIP PAIN, LEFT 12/12/2007   Qualifier: Diagnosis of  By: Loanne Drilling MD, Jacelyn Pi   . Hyperlipidemia 10/07/2017  . Hypertension   . Hyponatremia 07/27/2011  . Incontinence of urine 08/14/2011  . Lumbar disc disease 04/09/2011  . MENOPAUSE, PREMATURE 04/08/2007   Qualifier: Diagnosis of  By: Marca Ancona RMA, Lucy    . 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 Osteoporosis   . Osteoporosis 04/08/2007   Qualifier: Diagnosis of  By: Reatha Armour, Lucy    . Polycythemia secondary to smoking 04/09/2011  . S/P right hip fracture 11/07/2017  . Schizoaffective disorder, bipolar type ([place]) 07/05/2011    Patient Active Problem List   Diagnosis Date Noted  . S/P right hip fracture 11/07/2017  . Closed comminuted intertrochanteric fracture of right femur ([place]) 11/07/2017  . Anxiety and depression   . Dyspnea 10/16/2017  . Anemia 10/16/2017  . COPD exacerbation ([place]) 10/07/2017  . Hyperlipidemia 10/07/2017  . Chronic respiratory failure with hypoxia ([place]) 10/07/2017  .  Acute on chronic respiratory failure with hypoxia and hypercapnia ([place]) 10/07/2017  . Incontinence of urine 08/14/2011  . Hyponatremia 07/27/2011  . Schizoaffective disorder, bipolar type ([place]) 07/05/2011  . Cough 05/08/2011  . Encounter for long-term (current) use of other medications 04/09/2011  . Lumbar disc disease 04/09/2011  . Polycythemia secondary to smoking 04/09/2011  . HIP PAIN, LEFT 12/12/2007  . 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 DIABETES MELLITUS, TYPE II 04/08/2007  . MENOPAUSE, PREMATURE 04/08/2007  . Chronic hyponatremia 04/08/2007   . Schizoaffective disorder ([place]) 04/08/2007  . DEPRESSION 04/08/2007  . Essential hypertension 04/08/2007  . COPD 04/08/2007  . GERD 04/08/2007  . Osteoporosis 04/08/2007  . DYSURIA 04/08/2007    Past Surgical History:  Procedure Laterality Date  . EYE SURGERY     on left  eye, pt states that she sees bad out of the right eye and needs surgery there  . INTRAMEDULLARY (IM) NAIL INTERTROCHANTERIC Right 11/13/2017   Procedure: INTRAMEDULLARY (IM) NAIL INTERTROCHANTRIC;  Surgeon: Shona Needles, MD;  Location: [facility];  Service: Orthopedics;  Laterality: Right;  . PELVIC FRACTURE SURGERY       OB History   None      Home Medications    Prior to Admission medications   Medication Sig Start Date End Date Taking? Authorizing Provider  acetaminophen (TYLENOL) 325 MG tablet Take 650 mg by mouth every 6 (six) hours as needed for mild pain.   Yes [provider]  albuterol (PROVENTIL HFA;VENTOLIN HFA) 108 (90 Base) MCG/ACT inhaler Inhale 2 puffs into the lungs every 4 (four) hours as needed for wheezing or shortness of breath. 07/05/17  Yes Ward, Delice Bison, DO  ALPRAZolam (XANAX) 0.5 MG tablet Take 1 tablet (0.5 mg total) by mouth 3 (three) times daily. 11/18/17  Yes Georgette Shell, MD  alum & mag hydroxide-simeth ([place]) 200-200-20 MG/5ML suspension Take 30 mLs by mouth every 6 (six) hours as needed for indigestion or heartburn.   Yes [provider]  aspirin 81 MG chewable tablet Chew 81 mg by mouth daily.   Yes [provider]  buPROPion (WELLBUTRIN XL) 300 MG 24 hr tablet Take 1 tablet (300 mg total) by mouth every morning. Patient taking differently: Take 300 mg by mouth every morning.  10/19/17 10/19/18 Yes Aline August, MD  carvedilol (COREG) 25 MG tablet Take 25 mg by mouth 2 (two) times daily with a meal.   Yes [provider]  cloNIDine (CATAPRES - DOSED IN MG/24 HR) 0.3 mg/24hr patch Place 0.3 mg onto the skin once a week. On Monday   Yes  [provider]  dicyclomine (BENTYL) 20 MG tablet Take 1 tablet (20 mg total) by mouth 2 (two) times daily. 11/04/17  Yes Recardo Evangelist, PA-C  divalproex (DEPAKOTE) 250 MG DR tablet Take 1 tablet (250 mg total) by mouth every 12 (twelve) hours. Patient taking differently: Take 250 mg by mouth 3 (three) times daily.  08/12/17  Yes Nita Sells, MD  docusate sodium (COLACE) 100 MG capsule Take 100 mg by mouth 2 (two) times daily as needed for mild constipation.   Yes [provider]  ferrous sulfate 325 (65 FE) MG tablet Take 325 mg by mouth daily with breakfast.   Yes [provider]  fluticasone (FLONASE) 50 MCG/ACT nasal spray Place 2 sprays into both nostrils 2 (two) times daily.   Yes [provider]  fluticasone furoate-vilanterol (BREO ELLIPTA) 100-25 MCG/INH AEPB Inhale 1 puff into the lungs daily.  Yes [provider]  furosemide (LASIX) 40 MG tablet Take 40 mg by mouth 2 (two) times daily.   Yes [provider]  gabapentin (NEURONTIN) 100 MG capsule Take 100 mg by mouth 2 (two) times daily.   Yes [provider]  hydrALAZINE (APRESOLINE) 100 MG tablet Take 100 mg by mouth 3 (three) times daily.   Yes [provider]  hydrOXYzine (ATARAX/VISTARIL) 25 MG tablet Take 1 tablet (25 mg total) by mouth 3 (three) times daily as needed for anxiety. 10/19/17  Yes Aline August, MD  ipratropium-albuterol (DUONEB) 0.5-2.5 (3) MG/3ML SOLN Take 3 mLs by nebulization 3 (three) times daily.    Yes [provider]  ipratropium-albuterol (DUONEB) 0.5-2.5 (3) MG/3ML SOLN Take 3 mLs by nebulization every 8 (eight) hours as needed (shortness of breath).   Yes [provider]  lurasidone (LATUDA) 80 MG TABS tablet Take 1 tablet (80 mg total) by mouth daily with breakfast. 08/12/17  Yes Nita Sells, MD  magnesium oxide (MAG-OX) 400 (241.3 Mg) MG tablet Take 2 tablets (800 mg total) by mouth 2 (two) times  daily. Patient taking differently: Take 800 mg by mouth daily.  08/12/17  Yes Nita Sells, MD  Melatonin 10 MG TABS Take 10 mg by mouth at bedtime.   Yes [provider]  NIFEdipine (PROCARDIA XL/ADALAT-CC) 90 MG 24 hr tablet Take 90 mg by mouth daily.   Yes [provider]  omeprazole (PRILOSEC) 20 MG capsule Take 1 capsule (20 mg total) by mouth 2 (two) times daily before a meal. For acid reflux. Patient taking differently: Take 20 mg by mouth daily. For acid reflux. 10/19/17  Yes Aline August, MD  oxycodone (OXY-IR) 5 MG capsule Take 5 mg by mouth every 6 (six) hours.    Yes [provider]  PARoxetine (PAXIL) 20 MG tablet Take 1 tablet (20 mg total) by mouth daily. 10/19/17  Yes Aline August, MD  polyethylene glycol (MIRALAX) packet Take 17 g by mouth daily as needed. Patient taking differently: Take 17 g by mouth daily.  10/19/17  Yes Aline August, MD  pravastatin (PRAVACHOL) 20 MG tablet Take 20 mg by mouth daily.   Yes [provider]  sodium chloride 1 g tablet Take 1 g by mouth 3 (three) times daily.   Yes [provider]  traZODone (DESYREL) 50 MG tablet Take 100 mg by mouth at bedtime.    Yes [provider]  aspirin 81 MG tablet Take 1 tablet (81 mg total) by mouth daily. For heart health Patient not taking: Reported on 02/27/2018 08/06/11   Greig Castilla, FNP  enoxaparin (LOVENOX) 40 MG/0.4ML injection Inject 0.4 mLs (40 mg total) into the skin daily. Patient not taking: Reported on 12/20/2017 11/18/17   Georgette Shell, MD  levofloxacin (LEVAQUIN) 750 MG tablet Take 1 tablet (750 mg total) by mouth daily. X 7 days Patient not taking: Reported on 02/27/2018 12/24/17   Margarita Mail, PA-C    Family History History reviewed. No pertinent family history.  Social History Social History   Tobacco Use  . Smoking status: Current Every Day Smoker    Packs/day: 0.25    Types: Cigarettes  . Smokeless tobacco: Never  Used  Substance Use Topics  . Alcohol use: No  . Drug use: No     Allergies   Clarithromycin and Penicillins   Review of Systems Review of Systems  Constitutional: Negative for chills and fever.  HENT: Negative for congestion.   Eyes:  Negative for visual disturbance.  Respiratory: Positive for cough, chest tightness and shortness of breath.   Cardiovascular: Negative for chest pain and leg swelling.  Gastrointestinal: Negative for nausea and vomiting.  Genitourinary: Positive for dysuria.  Musculoskeletal: Positive for back pain.  Skin: Negative for color change.   Physical Exam Updated Vital Signs BP (!) 197/68   Pulse 70   Temp 98.1 F (36.7 C) (Oral)   Resp 17   SpO2 99%   Physical Exam  Constitutional: She appears well-developed and well-nourished. She does not appear ill. No distress.  HENT:  Head: Normocephalic and atraumatic.  Mouth/Throat: Oropharynx is clear and moist.  Eyes: Conjunctivae are normal.  Neck: Neck supple.  Cardiovascular: Normal rate, regular rhythm and normal heart sounds.  No murmur heard. Pulmonary/Chest: Effort normal. No respiratory distress.  Speaking in full sentences. satting at 96% on monitor. No tachypnea. Crackles noted to LLL. No wheezes noted.   Abdominal: Soft. Bowel sounds are normal.  Mild generalized TTP the improves with distraction.  Musculoskeletal: She exhibits no edema.       Right lower leg: Normal. She exhibits no tenderness and no edema.       Left lower leg: Normal. She exhibits no tenderness and no edema.  Ecchymosis to the left shin  Neurological: She is alert.  Skin: Skin is warm and dry.  Psychiatric: Her mood appears anxious.  Mood is labile  Nursing note and vitals reviewed.  ED Treatments / Results  Labs (all labs ordered are listed, but only abnormal results are displayed) Labs Reviewed  BASIC METABOLIC PANEL - Abnormal; Notable for the following components:      Result Value   Sodium 133 (*)     Chloride 94 (*)    All other components within normal limits  RAPID URINE DRUG SCREEN, HOSP PERFORMED - Abnormal; Notable for the following components:   Benzodiazepines POSITIVE (*)    All other components within normal limits  ACETAMINOPHEN LEVEL - Abnormal; Notable for the following components:   Acetaminophen (Tylenol), Serum <10 (*)    All other components within normal limits  URINALYSIS, ROUTINE W REFLEX MICROSCOPIC - Abnormal; Notable for the following components:   Color, Urine STRAW (*)    All other components within normal limits  BRAIN NATRIURETIC PEPTIDE - Abnormal; Notable for the following components:   B Natriuretic Peptide 138.2 (*)    All other components within normal limits  D-DIMER, QUANTITATIVE (NOT AT [facility]) - Abnormal; Notable for the following components:   D-Dimer, Quant 0.79 (*)    All other components within normal limits  CBC WITH DIFFERENTIAL/PLATELET  ETHANOL  SALICYLATE LEVEL  I-STAT TROPONIN, ED  I-STAT TROPONIN, ED    EKG EKG Interpretation  Date/Time:  Thursday February 27 2018 17:31:28 EDT Ventricular Rate:  58 PR Interval:    QRS Duration: 89 QT Interval:  456 QTC Calculation: 448 R Axis:   52 Text Interpretation:  Sinus rhythm Minimal ST elevation, anterior leads No significant change since last tracing Confirmed by Deno Etienne [id number]) on 02/27/2018 5:36:57 PM   Radiology Dg Chest 2 View  Result Date: 02/27/2018 CLINICAL DATA:  Shortness of breath for 2 days EXAM: CHEST - 2 VIEW COMPARISON:  December 24, 2017 FINDINGS: The heart size and mediastinal contours are stable. The heart size is enlarged. Both lungs are clear. Minimal scar is identified in the right lung base unchanged. The visualized skeletal structures are stable. IMPRESSION: No active cardiopulmonary disease. Electronically Signed   By:  Abelardo Diesel M.D.   On: 02/27/2018 18:26   Ct Angio Chest Pe W And/or Wo Contrast  Result Date: 02/27/2018 CLINICAL DATA:  57 year old female with  acute shortness of breath for 2 days and RIGHT chest pain for 1 day. EXAM: CT ANGIOGRAPHY CHEST WITH CONTRAST TECHNIQUE: Multidetector CT imaging of the chest was performed using the standard protocol during bolus administration of intravenous contrast. Multiplanar CT image reconstructions and MIPs were obtained to evaluate the vascular anatomy. CONTRAST:  100 cc intravenous Isovue 370 COMPARISON:  12/24/2017 CT FINDINGS: Cardiovascular:  This is a technically satisfactory study. No pulmonary emboli are identified. Cardiomegaly and coronary artery atherosclerotic calcifications are present. Aortic atherosclerotic calcifications noted without aneurysm. A small stable pericardial effusion again noted. Mediastinum/Nodes: No enlarged mediastinal, hilar, or axillary lymph nodes. Thyroid gland, trachea, and esophagus demonstrate no significant findings. Lungs/Pleura: No airspace disease, nodule, mass, pleural effusion or pneumothorax. Mild bibasilar atelectasis/scarring again noted. Very mild paraseptal emphysema noted. Interstitial prominence within the anterior aspect of the LEFT UPPER lobe again identified. Upper Abdomen: Very heavy atherosclerotic calcifications that appear to fill the majority of the aortic lumen at the level of the SMA and main renal arteries again noted and appear unchanged from 10/06/17. However there is contrast opacification distal to this area and within the visualized SMA and main renal arteries. Musculoskeletal: No acute or suspicious bony abnormalities identified. Compression fractures of majority of the mid and LOWER thoracic vertebra and UPPER lumbar vertebra are again noted. Review of the MIP images confirms the above findings. IMPRESSION: 1. No evidence of pulmonary emboli. 2. Very heavy abdominal aortic calcification as discussed above, unchanged since 10/06/2017. 3. Cardiomegaly and coronary artery disease. 4. Aortic Atherosclerosis (ICD10-I70.0) and Emphysema (ICD10-J43.9).  Electronically Signed   By: Margarette Canada M.D.   On: 02/27/2018 22:43    Procedures Procedures (including critical care time)  Medications Ordered in ED Medications  ipratropium-albuterol (DUONEB) 0.5-2.5 (3) MG/3ML nebulizer solution 3 mL (3 mLs Nebulization Refused 02/27/18 1906)  carvedilol (COREG) tablet 25 mg (has no administration in time range)  traMADol (ULTRAM) tablet 50 mg (50 mg Oral Given 02/27/18 2128)  acetaminophen (TYLENOL) tablet 650 mg (has no administration in time range)  ALPRAZolam (XANAX) tablet 0.5 mg (has no administration in time range)  aspirin chewable tablet 81 mg (has no administration in time range)  buPROPion (WELLBUTRIN XL) 24 hr tablet 300 mg (has no administration in time range)  dicyclomine (BENTYL) tablet 20 mg (has no administration in time range)  divalproex (DEPAKOTE) DR tablet 250 mg (has no administration in time range)  ferrous sulfate tablet 325 mg (has no administration in time range)  fluticasone (FLONASE) 50 MCG/ACT nasal spray 2 spray (has no administration in time range)  fluticasone furoate-vilanterol (BREO ELLIPTA) 100-25 MCG/INH 1 puff (has no administration in time range)  furosemide (LASIX) tablet 40 mg (has no administration in time range)  gabapentin (NEURONTIN) capsule 100 mg (has no administration in time range)  hydrALAZINE (APRESOLINE) tablet 100 mg (has no administration in time range)  ipratropium-albuterol (DUONEB) 0.5-2.5 (3) MG/3ML nebulizer solution 3 mL (3 mLs Nebulization Given 02/27/18 2328)  lurasidone (LATUDA) tablet 80 mg (has no administration in time range)  magnesium oxide (MAG-OX) tablet 800 mg (has no administration in time range)  NIFEdipine (PROCARDIA-XL/ADALAT-CC/NIFEDICAL-XL) 24 hr tablet 90 mg (has no administration in time range)  pantoprazole (PROTONIX) EC tablet 40 mg (has no administration in time range)  oxyCODONE (Oxy IR/ROXICODONE) immediate release tablet 5 mg (has no  administration in time range)    PARoxetine (PAXIL) tablet 20 mg (has no administration in time range)  polyethylene glycol (MIRALAX / GLYCOLAX) packet 17 g (has no administration in time range)  pravastatin (PRAVACHOL) tablet 20 mg (has no administration in time range)  sodium chloride tablet 1 g (has no administration in time range)  sucralfate (CARAFATE) tablet 1 g (has no administration in time range)  traZODone (DESYREL) tablet 100 mg (has no administration in time range)  ipratropium-albuterol (DUONEB) 0.5-2.5 (3) MG/3ML nebulizer solution 3 mL (3 mLs Nebulization Given 02/27/18 1832)  methylPREDNISolone sodium succinate (SOLU-MEDROL) 125 mg/2 mL injection 80 mg (80 mg Intravenous Given 02/27/18 1852)  aspirin chewable tablet 324 mg (324 mg Oral Given 02/27/18 2001)  fentaNYL (SUBLIMAZE) injection 50 mcg (50 mcg Intravenous Given 02/27/18 2059)  iopamidol (ISOVUE-370) 76 % injection 100 mL (100 mLs Intravenous Contrast Given 02/27/18 2114)     Initial Impression / Assessment and Plan / ED Course  I have reviewed the triage vital signs and the nursing notes.  Pertinent labs & imaging results that were available during my care of the patient were reviewed by me and considered in my medical decision making (see chart for details).  Attempted to contact staff at [facility] multiple. On initial attempt I was sent to voicemail. On second attempt the line was disconnected. On third attempt spoke with [facility] who is one of the caretakers. Pictures on wall were out to get her. Pt has been having behavioral issues and was yelling at staff and her roommates which is not normal for her.  Final Clinical Impressions(s) / ED Diagnoses   Final diagnoses:  SOB (shortness of breath)  Encounter for psychiatric assessment   Pt presenting to the ED today c/o sob, cough, and chest tightness.  Also with concerns from her SNF staff that she has had behavioral changes and some paranoid behaviors. Pt denies SI, HI or AVH but has labile mood  during exam. Patient satting well on room air in the ED.  Has been hypertensive but otherwise vitals stable and she is afebrile.    Lungs with faint crackles to LLL. Chest x-ray is without pneumonia.  Delta troponins are negative.  EKG with NSR minimal ST elevation in the anterior leads with no significant change since last tracing. sxs seem atypical for ACS.   CBC is without leukocytosis. BMP with mild hyponatremia and hypochloremia, likely mildly hypovolemic.  UA negative for UTI. UDS positive for benzos.  EtOH, salicylate and acetaminophen-levels are negative.  Re-evaluated pt and she states she feels no improvement after administration of 2 duo nebs and solumedrol. Will order d-dimer and bnp. BNP is only mildly elevated, do not suspect acute heart failure exacerbation as a cause of her symptoms.  She does not appear fluid overloaded and there is no pulmonary congestion on her chest x-ray. D-dimer positive, will proceed with CTA.  CTA without evidence of PE or pneumonia. Cardiomegaly and CAD. Heavy abd aortic calcification that appears stable.  At this point the pts c/o SOB are unclear. She has no wheezing on exam and she states that her sxs are not resolved after two duonebs and solumedrol so this seems less likely to be COPD exacerbation. She does not have evidence of pulmonary vascular congestion, fluid overload, or findings consistent with CHF exacerbation. She has no evidence of PE on CTA. Additionally, she has been satting well on 2L throughout the entirety of her stay in the ED. She has had no tachypnea  and speaks in full sentences. She refused to ambulate with pulse ox though she tells me she walks with a walker at her SNF. Do not feel that further workup, intervention, or admission to the hospital is necessary at this time. She should continue her regular home medications to tx her COPD.  Additionally, given behavioral changes that were reported by the staff at [facility] that pt has no  evidence of infection to be the cause of her symptoms, will consult TTS for further evaluation and to r/o underlying psychiatric cause of her sxs and/or behavioral changes.   Pt care was signed out to oncoming provider with plan to follow up on TTS consultation. If patient is psychiatrically cleared then she is likely safe for discharge with close outpatient f/u.   ED Discharge Orders    None       Rodney Booze, PA-C 02/27/18 [place], DO 03/03/18 1508

## 2018-02-27 NOTE — ED Notes (Signed)
Pt stated she cant walk  and not willing to try to get up and use a walker
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
## 2018-02-27 NOTE — ED Notes (Signed)
Pt using telemonitor to speak with Corene Cornea, NP with TTS

## 2018-02-28 DIAGNOSIS — R0602 Shortness of breath: Secondary | ICD-10-CM | POA: Diagnosis not present

## 2018-02-28 NOTE — ED Notes (Signed)
Pt transported by [facility] to [facility]

## 2018-02-28 NOTE — ED Provider Notes (Signed)
TTS has completed their assessment, recommendation for discharge back to facility and follow-up with her regular psychiatrist and therapist as scheduled.  [facility] called for transport.   Larene Pickett, PA-C 02/28/18 0018    Varney Biles, MD 02/28/18 [phone]

## 2018-02-28 NOTE — Progress Notes (Signed)
Pt nurse Dominica informed of disposition.

## 2018-02-28 NOTE — ED Notes (Signed)
[place] nurse to give report about Pt's return to facility

## 2018-02-28 NOTE — ED Notes (Addendum)
Called [facility] to set up Pt's transport back to [facility]

## 2018-04-17 ENCOUNTER — Emergency Department (HOSPITAL_COMMUNITY): Payer: Medicare Other

## 2018-04-17 ENCOUNTER — Encounter (HOSPITAL_COMMUNITY): Payer: Self-pay | Admitting: Emergency Medicine

## 2018-04-17 ENCOUNTER — Emergency Department (HOSPITAL_COMMUNITY)
Admission: EM | Admit: 2018-04-17 | Discharge: 2018-04-17 | Disposition: A | Discharge status: Skilled Nursing Facility | Payer: Medicare Other | Attending: Emergency Medicine | Admitting: Emergency Medicine

## 2018-04-17 ENCOUNTER — Other Ambulatory Visit: Payer: Self-pay

## 2018-04-17 DIAGNOSIS — R0789 Other chest pain: Secondary | ICD-10-CM | POA: Diagnosis not present

## 2018-04-17 DIAGNOSIS — L89329 Pressure ulcer of left buttock, unspecified stage: Secondary | ICD-10-CM | POA: Diagnosis not present

## 2018-04-17 DIAGNOSIS — R609 Edema, unspecified: Secondary | ICD-10-CM | POA: Insufficient documentation

## 2018-04-17 DIAGNOSIS — F259 Schizoaffective disorder, unspecified: Secondary | ICD-10-CM | POA: Insufficient documentation

## 2018-04-17 DIAGNOSIS — I10 Essential (primary) hypertension: Secondary | ICD-10-CM | POA: Diagnosis not present

## 2018-04-17 DIAGNOSIS — F17210 Nicotine dependence, cigarettes, uncomplicated: Secondary | ICD-10-CM | POA: Insufficient documentation

## 2018-04-17 DIAGNOSIS — R0602 Shortness of breath: Secondary | ICD-10-CM | POA: Insufficient documentation

## 2018-04-17 DIAGNOSIS — I509 Heart failure, unspecified: Secondary | ICD-10-CM | POA: Insufficient documentation

## 2018-04-17 DIAGNOSIS — E871 Hypo-osmolality and hyponatremia: Secondary | ICD-10-CM | POA: Diagnosis not present

## 2018-04-17 DIAGNOSIS — Z79899 Other long term (current) drug therapy: Secondary | ICD-10-CM | POA: Insufficient documentation

## 2018-04-17 DIAGNOSIS — J449 Chronic obstructive pulmonary disease, unspecified: Secondary | ICD-10-CM | POA: Diagnosis not present

## 2018-04-17 LAB — BASIC METABOLIC PANEL
ANION GAP: 8 (ref 5–15)
BUN: 10 mg/dL (ref 6–20)
CALCIUM: 8.1 mg/dL — AB (ref 8.9–10.3)
CO2: 26 mmol/L (ref 22–32)
Chloride: 95 mmol/L — ABNORMAL LOW (ref 98–111)
Creatinine, Ser: 0.79 mg/dL (ref 0.44–1.00)
GFR calc Af Amer: >60 mL/min (ref >60)
Glucose, Bld: 114 mg/dL — ABNORMAL HIGH (ref 70–99)
POTASSIUM: 4 mmol/L (ref 3.5–5.1)
SODIUM: 129 mmol/L — AB (ref 135–145)

## 2018-04-17 LAB — CBC WITH DIFFERENTIAL/PLATELET
Abs Immature Granulocytes: 0.02 10*3/uL (ref 0.00–0.07)
BASOS PCT: 1 %
Basophils Absolute: 0 10*3/uL (ref 0.0–0.1)
EOS ABS: 0 10*3/uL (ref 0.0–0.5)
Eosinophils Relative: 1 %
HCT: 32.3 % — ABNORMAL LOW (ref 36.0–46.0)
Hemoglobin: 10.4 g/dL — ABNORMAL LOW (ref 12.0–15.0)
Immature Granulocytes: 0 %
Lymphocytes Relative: 33 %
Lymphs Abs: 1.9 10*3/uL (ref 0.7–4.0)
MCH: 29.6 pg (ref 26.0–34.0)
MCHC: 32.2 g/dL (ref 30.0–36.0)
MCV: 92 fL (ref 80.0–100.0)
MONO ABS: 0.6 10*3/uL (ref 0.1–1.0)
MONOS PCT: 10 %
NEUTROS PCT: 55 %
Neutro Abs: 3.1 10*3/uL (ref 1.7–7.7)
Platelets: 132 10*3/uL — ABNORMAL LOW (ref 150–400)
RBC: 3.51 MIL/uL — AB (ref 3.87–5.11)
RDW: 13.3 % (ref 11.5–15.5)
WBC: 5.6 10*3/uL (ref 4.0–10.5)
nRBC: 0 % (ref 0.0–0.2)

## 2018-04-17 LAB — TROPONIN I

## 2018-04-17 LAB — BRAIN NATRIURETIC PEPTIDE: B Natriuretic Peptide: 97.1 pg/mL (ref 0.0–100.0)

## 2018-04-17 MED ORDER — SIMETHICONE 40 MG/0.6ML PO SUSP (UNIT DOSE)
40.0000 mg | Freq: Once | ORAL | Status: DC
  Filled 2018-04-17: qty 0.6

## 2018-04-17 MED ORDER — ALBUTEROL SULFATE (2.5 MG/3ML) 0.083% IN NEBU
5.0000 mg | INHALATION_SOLUTION | Freq: Once | RESPIRATORY_TRACT | Status: AC
  Administered 2018-04-17: 5 mg via RESPIRATORY_TRACT
  Filled 2018-04-17: qty 6

## 2018-04-17 MED ORDER — FUROSEMIDE 40 MG PO TABS
20.0000 mg | ORAL_TABLET | Freq: Once | ORAL | Status: AC
  Administered 2018-04-17: 20 mg via ORAL
  Filled 2018-04-17: qty 1

## 2018-04-17 MED ORDER — MORPHINE SULFATE (PF) 4 MG/ML IV SOLN
4.0000 mg | Freq: Once | INTRAVENOUS | Status: AC
  Administered 2018-04-17: 4 mg via INTRAVENOUS
  Filled 2018-04-17: qty 1

## 2018-04-17 NOTE — ED Triage Notes (Addendum)
Pt to ED from [facility] by [facility] with c/o SOB for the past two weeks. Pt has hx of CHF, DM, and COPD. Staff at facility claims patient is taking her oxygen off, pt states she out of Oxygen but staff says it is not out and pt keeps taking it off. Pt A&O x4 Pt claims that she needs a breathing treatment and wanted to come be checked by ED. Pt states she just doesn't feel good, has a hernia that is bothering her and has a wound near her rectum that is hurting. Pt pain is 7/10

## 2018-04-17 NOTE — Discharge Instructions (Signed)
You were evaluated in the emergency room for increased shortness of breath and increased swelling in your legs.  You had blood work EKG and chest x-ray.  There was no signs of pneumonia or congestive heart failure.  We gave you an extra dose of Lasix for the fluid on your legs.  You will also need to continue to have some padding over that sore in your buttocks.  Please continue to wear your oxygen and use your breathing treatments.  Follow-up with your doctor.

## 2018-04-17 NOTE — ED Provider Notes (Signed)
[facility] DEPT Provider Note   CSN: [id number] Arrival date & time: 04/17/18  1933     History   Chief Complaint Chief Complaint  Patient presents with  . Shortness of Breath    HPI Natalie Thomas is a 57 y.o. female.  She is a poor historian.  She presents by EMS from her facility at [facility] for shortness of breath.  She feels that her oxygen has not been working well at the facility and the company will come out to check it.  Per EMS the nurses at the facility stated everything is been working fine and she keeps taking the oxygen off.  Patient is also complaining some increased shortness of breath some edema on her legs and also a sore near her rectum that is hurting her.  She says the cough but nonproductive any sputum denies any fever.  She says she intermittently will get chest pain.  She is had a few episodes of diarrhea and she states other residents at the facility have the virus.  She is on 24/7 oxygen 2 L and continues to smoke.  The history is provided by the patient.  Shortness of Breath  This is a recurrent problem. The average episode lasts 2 weeks. The problem occurs intermittently.The problem has not changed since onset.Associated symptoms include cough, chest pain, leg pain and leg swelling. Pertinent negatives include no fever, no headaches, no rhinorrhea, no sore throat, no neck pain, no sputum production, no hemoptysis, no syncope, no abdominal pain and no rash. It is unknown what precipitated the problem. Risk factors include smoking. She has tried beta-agonist inhalers for the symptoms. The treatment provided mild relief.    Past Medical History:  Diagnosis Date  . Acute on chronic respiratory failure with hypoxia and hypercapnia ([place]) 10/07/2017  . Anemia 10/16/2017  . Anxiety and depression   . CHF (congestive heart failure) ([place])   . Chronic hyponatremia 04/08/2007   Qualifier: Diagnosis of  By: Marca Ancona RMA, Lucy    .  Chronic respiratory failure with hypoxia ([place]) 10/07/2017  . Closed comminuted intertrochanteric fracture of right femur ([place]) 11/07/2017  . COPD 04/08/2007   Qualifier: Diagnosis of  By: Marca Ancona RMA, Lucy    . Cough 05/08/2011  . Depression   . Diabetes mellitus, type 2 ([place])    pt denies but states that she has been treated for DM  . Dyspnea 10/16/2017  . Dysuria 04/08/2007   Qualifier: Diagnosis of  By: Reatha Armour, Lucy    . Essential hypertension 04/08/2007   Qualifier: Diagnosis of  By: Marca Ancona RMA, Lucy    . GERD (gastroesophageal reflux disease)   . HIP PAIN, LEFT 12/12/2007   Qualifier: Diagnosis of  By: Loanne Drilling MD, Jacelyn Pi   . Hyperlipidemia 10/07/2017  . Hypertension   . Hyponatremia 07/27/2011  . Incontinence of urine 08/14/2011  . Lumbar disc disease 04/09/2011  . MENOPAUSE, PREMATURE 04/08/2007   Qualifier: Diagnosis of  By: Marca Ancona RMA, Lucy    . 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 Osteoporosis   . Osteoporosis 04/08/2007   Qualifier: Diagnosis of  By: Reatha Armour, Lucy    . Polycythemia secondary to smoking 04/09/2011  . S/P right hip fracture 11/07/2017  . Schizoaffective disorder, bipolar type ([place]) 07/05/2011    Patient Active Problem List   Diagnosis Date Noted  . S/P right hip fracture 11/07/2017  . Closed comminuted intertrochanteric  fracture of right femur ([place]) 11/07/2017  . Anxiety and depression   . Dyspnea 10/16/2017  . Anemia 10/16/2017  . COPD exacerbation ([place]) 10/07/2017  . Hyperlipidemia 10/07/2017  . Chronic respiratory failure with hypoxia ([place]) 10/07/2017  . Acute on chronic respiratory failure with hypoxia and hypercapnia ([place]) 10/07/2017  . Incontinence of urine 08/14/2011  . Hyponatremia 07/27/2011  . Schizoaffective disorder, bipolar type ([place]) 07/05/2011  . Cough 05/08/2011  . Encounter for long-term (current) use of other medications 04/09/2011  . Lumbar disc disease 04/09/2011  . Polycythemia  secondary to smoking 04/09/2011  . HIP PAIN, LEFT 12/12/2007  . 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 DIABETES MELLITUS, TYPE II 04/08/2007  . MENOPAUSE, PREMATURE 04/08/2007  . Chronic hyponatremia 04/08/2007  . Schizoaffective disorder ([place]) 04/08/2007  . DEPRESSION 04/08/2007  . Essential hypertension 04/08/2007  . COPD 04/08/2007  . GERD 04/08/2007  . Osteoporosis 04/08/2007  . DYSURIA 04/08/2007    Past Surgical History:  Procedure Laterality Date  . EYE SURGERY     on left  eye, pt states that she sees bad out of the right eye and needs surgery there  . INTRAMEDULLARY (IM) NAIL INTERTROCHANTERIC Right 11/13/2017   Procedure: INTRAMEDULLARY (IM) NAIL INTERTROCHANTRIC;  Surgeon: Shona Needles, MD;  Location: [place];  Service: Orthopedics;  Laterality: Right;  . PELVIC FRACTURE SURGERY       OB History   None      Home Medications    Prior to Admission medications   Medication Sig Start Date End Date Taking? Authorizing Provider  acetaminophen (TYLENOL) 325 MG tablet Take 650 mg by mouth 2 (two) times daily as needed for mild pain.    Yes [provider]  albuterol (PROVENTIL HFA;VENTOLIN HFA) 108 (90 Base) MCG/ACT inhaler Inhale 2 puffs into the lungs every 4 (four) hours as needed for wheezing or shortness of breath. 07/05/17  Yes Ward, Delice Bison, DO  ALPRAZolam (XANAX) 0.5 MG tablet Take 1 tablet (0.5 mg total) by mouth 3 (three) times daily. 11/18/17  Yes Georgette Shell, MD  buPROPion (WELLBUTRIN XL) 150 MG 24 hr tablet Take 150 mg by mouth daily.   Yes [provider]  carvedilol (COREG) 25 MG tablet Take 25 mg by mouth 2 (two) times daily with a meal.   Yes [provider]  divalproex (DEPAKOTE) 250 MG DR tablet Take 1 tablet (250 mg total) by mouth every 12 (twelve) hours. Patient taking differently: Take 250 mg by mouth 3 (three) times daily.  08/12/17  Yes Nita Sells, MD  ferrous sulfate 325 (65 FE) MG tablet Take 325 mg  by mouth daily with breakfast.   Yes [provider]  fluticasone (FLONASE) 50 MCG/ACT nasal spray Place 2 sprays into both nostrils 2 (two) times daily.   Yes [provider]  fluticasone furoate-vilanterol (BREO ELLIPTA) 100-25 MCG/INH AEPB Inhale 1 puff into the lungs daily.   Yes [provider]  furosemide (LASIX) 20 MG tablet Take 20 mg by mouth 2 (two) times daily.   Yes [provider]  gabapentin (NEURONTIN) 100 MG capsule Take 100 mg by mouth 2 (two) times daily as needed (leg pain).    Yes [provider]  hydrALAZINE (APRESOLINE) 100 MG tablet Take 100 mg by mouth 3 (three) times daily.   Yes [provider]  hydrOXYzine (ATARAX/VISTARIL) 25 MG tablet Take 1 tablet (25 mg total) by mouth 3 (three) times daily as needed for anxiety. Patient taking differently: Take 25  mg by mouth 3 (three) times daily.  10/19/17  Yes Aline August, MD  ipratropium-albuterol (DUONEB) 0.5-2.5 (3) MG/3ML SOLN Take 3 mLs by nebulization 3 (three) times daily as needed (sob).    Yes [provider]  ipratropium-albuterol (DUONEB) 0.5-2.5 (3) MG/3ML SOLN Take 3 mLs by nebulization 3 (three) times daily.    Yes [provider]  lurasidone (LATUDA) 80 MG TABS tablet Take 1 tablet (80 mg total) by mouth daily with breakfast. Patient taking differently: Take 80 mg by mouth 2 (two) times daily.  08/12/17  Yes Nita Sells, MD  magnesium oxide (MAG-OX) 400 (241.3 Mg) MG tablet Take 2 tablets (800 mg total) by mouth 2 (two) times daily. Patient taking differently: Take 800 mg by mouth daily.  08/12/17  Yes Nita Sells, MD  Melatonin 10 MG TABS Take 10 mg by mouth at bedtime.   Yes [provider]  NIFEdipine (PROCARDIA XL/ADALAT-CC) 90 MG 24 hr tablet Take 90 mg by mouth daily.   Yes [provider]  omeprazole (PRILOSEC) 20 MG capsule Take 1 capsule (20 mg total) by mouth 2 (two) times daily before a meal. For acid  reflux. Patient taking differently: Take 20 mg by mouth daily. For acid reflux. 10/19/17  Yes Aline August, MD  [place] (OYSTER CALCIUM) 500 MG TABS tablet Take 500 mg of elemental calcium by mouth daily.   Yes [provider]  PARoxetine (PAXIL) 30 MG tablet Take 30 mg by mouth daily.   Yes [provider]  polyethylene glycol (MIRALAX) packet Take 17 g by mouth daily as needed. Patient taking differently: Take 17 g by mouth daily.  10/19/17  Yes Aline August, MD  pravastatin (PRAVACHOL) 20 MG tablet Take 20 mg by mouth daily.   Yes [provider]  sodium chloride 1 g tablet Take 1 g by mouth 3 (three) times daily.   Yes [provider]  traZODone (DESYREL) 100 MG tablet Take 100 mg by mouth at bedtime.   Yes [provider]  aspirin 81 MG tablet Take 1 tablet (81 mg total) by mouth daily. For heart health Patient not taking: Reported on 02/27/2018 08/06/11   Greig Castilla, FNP  buPROPion (WELLBUTRIN XL) 300 MG 24 hr tablet Take 1 tablet (300 mg total) by mouth every morning. Patient not taking: Reported on 04/17/2018 10/19/17 10/19/18  Aline August, MD  cloNIDine (CATAPRES - DOSED IN MG/24 HR) 0.3 mg/24hr patch Place 0.3 mg onto the skin once a week. On Monday    [provider]  dicyclomine (BENTYL) 20 MG tablet Take 1 tablet (20 mg total) by mouth 2 (two) times daily. Patient taking differently: Take 20 mg by mouth 2 (two) times daily as needed for spasms.  11/04/17   Recardo Evangelist, PA-C  docusate sodium (COLACE) 100 MG capsule Take 100 mg by mouth 2 (two) times daily as needed for mild constipation.    [provider]  enoxaparin (LOVENOX) 40 MG/0.4ML injection Inject 0.4 mLs (40 mg total) into the skin daily. Patient not taking: Reported on 12/20/2017 11/18/17   Georgette Shell, MD  levofloxacin (LEVAQUIN) 750 MG tablet Take 1 tablet (750 mg total) by mouth daily. X 7 days Patient not taking: Reported on 02/27/2018  12/24/17   Margarita Mail, PA-C  PARoxetine (PAXIL) 20 MG tablet Take 1 tablet (20 mg total) by mouth daily. Patient not taking: Reported on 04/17/2018 10/19/17   Aline August, MD    Family History No family  history on file.  Social History Social History   Tobacco Use  . Smoking status: Current Every Day Smoker    Packs/day: 0.25    Types: Cigarettes  . Smokeless tobacco: Never Used  Substance Use Topics  . Alcohol use: No  . Drug use: No     Allergies   Clarithromycin and Penicillins   Review of Systems Review of Systems  Constitutional: Negative for fever.  HENT: Negative for rhinorrhea and sore throat.   Eyes: Negative for visual disturbance.  Respiratory: Positive for cough and shortness of breath. Negative for hemoptysis and sputum production.   Cardiovascular: Positive for chest pain and leg swelling. Negative for syncope.  Gastrointestinal: Negative for abdominal pain.  Genitourinary: Negative for dysuria.  Musculoskeletal: Negative for neck pain.  Skin: Positive for wound. Negative for rash.  Neurological: Negative for headaches.     Physical Exam Updated Vital Signs BP (!) 165/56 (BP Location: Right Arm)   Pulse 60   Temp 98.4 F (36.9 C) (Oral)   Resp 16   Ht 5\' 5"  (1.651 m)   Wt 83.9 kg   SpO2 97%   BMI 30.79 kg/m   Physical Exam  Constitutional: She appears well-developed and well-nourished. No distress.  HENT:  Head: Normocephalic and atraumatic.  Eyes: Conjunctivae are normal.  Neck: Neck supple.  Cardiovascular: Normal rate and regular rhythm.  No murmur heard. Pulmonary/Chest: Effort normal and breath sounds normal. No respiratory distress.  Abdominal: Soft. She exhibits no mass. There is no tenderness.  Musculoskeletal:       Right lower leg: She exhibits tenderness and edema.       Left lower leg: She exhibits tenderness and edema.  Neurological: She is alert.  Skin: Skin is warm and dry. Capillary refill takes less than 2  seconds.  The patient's left buttock near her peri-area she has approximately 1 cm shallow ulceration.  There is no induration.  I have asked for the nurses to place some DuoDERM over it.  Psychiatric: She has a normal mood and affect.  Nursing note and vitals reviewed.    ED Treatments / Results  Labs (all labs ordered are listed, but only abnormal results are displayed) Labs Reviewed  BASIC METABOLIC PANEL - Abnormal; Notable for the following components:      Result Value   Sodium 129 (*)    Chloride 95 (*)    Glucose, Bld 114 (*)    Calcium 8.1 (*)    All other components within normal limits  CBC WITH DIFFERENTIAL/PLATELET - Abnormal; Notable for the following components:   RBC 3.51 (*)    Hemoglobin 10.4 (*)    HCT 32.3 (*)    Platelets 132 (*)    All other components within normal limits  TROPONIN I  BRAIN NATRIURETIC PEPTIDE    EKG EKG Interpretation  Date/Time:  Thursday April 17 2018 21:10:32 EDT Ventricular Rate:  57 PR Interval:    QRS Duration: 97 QT Interval:  474 QTC Calculation: 462 R Axis:   57 Text Interpretation:  Sinus rhythm Prolonged PR interval similar to prior 9/19 Confirmed by Aletta Edouard [phone]) on 04/17/2018 9:13:31 PM   Radiology Dg Chest 2 View  Result Date: 04/17/2018 CLINICAL DATA:  Shortness of breath EXAM: CHEST - 2 VIEW COMPARISON:  02/27/2018 FINDINGS: Mild cardiomegaly. Small effusions. No edema. Linear atelectasis in the lung bases. No acute bony abnormality. IMPRESSION: Cardiomegaly with bibasilar atelectasis and small effusions. Electronically Signed   By: Rolm Baptise M.D.  On: 04/17/2018 20:20    Procedures Procedures (including critical care time)  Medications Ordered in ED Medications  morphine 4 MG/ML injection 4 mg (has no administration in time range)  albuterol (PROVENTIL) (2.5 MG/3ML) 0.083% nebulizer solution 5 mg (5 mg Nebulization Given 04/17/18 2023)     Initial Impression / Assessment and Plan / ED  Course  I have reviewed the triage vital signs and the nursing notes.  Pertinent labs & imaging results that were available during my care of the patient were reviewed by me and considered in my medical decision making (see chart for details).  Clinical Course as of Apr 18 2323  Thu Apr 17, 2018  [id number] Ultimately received a phone call back from [facility] who is the patient's legal guardian and they are okay with returning to the facility.   [MB]    Clinical Course User Index [MB] Hayden Rasmussen, MD     Final Clinical Impressions(s) / ED Diagnoses   Final diagnoses:  SOB (shortness of breath)  Peripheral edema  Hyponatremia  Pressure injury of skin of left buttock, unspecified injury stage    ED Discharge Orders    None       Hayden Rasmussen, MD 04/18/18 1135

## 2018-04-17 NOTE — ED Notes (Signed)
[facility] has been called for transport to [facility]

## 2018-04-17 NOTE — ED Notes (Signed)
Pt refused to sign for d/c  

## 2018-05-14 ENCOUNTER — Other Ambulatory Visit: Payer: Self-pay

## 2018-05-14 ENCOUNTER — Emergency Department (HOSPITAL_COMMUNITY): Payer: Medicare Other

## 2018-05-14 ENCOUNTER — Encounter (HOSPITAL_COMMUNITY): Payer: Self-pay

## 2018-05-14 ENCOUNTER — Emergency Department (HOSPITAL_COMMUNITY)
Admission: EM | Admit: 2018-05-14 | Discharge: 2018-05-15 | Disposition: A | Discharge status: Home / Self Care | Payer: Medicare Other | Attending: Emergency Medicine | Admitting: Emergency Medicine

## 2018-05-14 DIAGNOSIS — I110 Hypertensive heart disease with heart failure: Secondary | ICD-10-CM | POA: Insufficient documentation

## 2018-05-14 DIAGNOSIS — Z79899 Other long term (current) drug therapy: Secondary | ICD-10-CM | POA: Diagnosis not present

## 2018-05-14 DIAGNOSIS — Z7982 Long term (current) use of aspirin: Secondary | ICD-10-CM | POA: Diagnosis not present

## 2018-05-14 DIAGNOSIS — J189 Pneumonia, unspecified organism: Secondary | ICD-10-CM | POA: Diagnosis not present

## 2018-05-14 DIAGNOSIS — F17210 Nicotine dependence, cigarettes, uncomplicated: Secondary | ICD-10-CM | POA: Diagnosis not present

## 2018-05-14 DIAGNOSIS — J449 Chronic obstructive pulmonary disease, unspecified: Secondary | ICD-10-CM | POA: Insufficient documentation

## 2018-05-14 DIAGNOSIS — R072 Precordial pain: Secondary | ICD-10-CM | POA: Diagnosis present

## 2018-05-14 DIAGNOSIS — I509 Heart failure, unspecified: Secondary | ICD-10-CM | POA: Diagnosis not present

## 2018-05-14 DIAGNOSIS — E119 Type 2 diabetes mellitus without complications: Secondary | ICD-10-CM | POA: Diagnosis not present

## 2018-05-14 LAB — CBC
HEMATOCRIT: 35.1 % — AB (ref 36.0–46.0)
Hemoglobin: 11.6 g/dL — ABNORMAL LOW (ref 12.0–15.0)
MCH: 29.2 pg (ref 26.0–34.0)
MCHC: 33 g/dL (ref 30.0–36.0)
MCV: 88.4 fL (ref 80.0–100.0)
NRBC: 0 % (ref 0.0–0.2)
PLATELETS: 210 10*3/uL (ref 150–400)
RBC: 3.97 MIL/uL (ref 3.87–5.11)
RDW: 12.7 % (ref 11.5–15.5)
WBC: 6.8 10*3/uL (ref 4.0–10.5)

## 2018-05-14 LAB — LIPASE, BLOOD: Lipase: 31 U/L (ref 11–51)

## 2018-05-14 LAB — BASIC METABOLIC PANEL
ANION GAP: 8 (ref 5–15)
BUN: 8 mg/dL (ref 6–20)
CALCIUM: 8.7 mg/dL — AB (ref 8.9–10.3)
CO2: 28 mmol/L (ref 22–32)
Chloride: 92 mmol/L — ABNORMAL LOW (ref 98–111)
Creatinine, Ser: 0.64 mg/dL (ref 0.44–1.00)
Glucose, Bld: 106 mg/dL — ABNORMAL HIGH (ref 70–99)
POTASSIUM: 4 mmol/L (ref 3.5–5.1)
SODIUM: 128 mmol/L — AB (ref 135–145)

## 2018-05-14 LAB — URINALYSIS, ROUTINE W REFLEX MICROSCOPIC
Bilirubin Urine: NEGATIVE
GLUCOSE, UA: NEGATIVE mg/dL
Hgb urine dipstick: NEGATIVE
KETONES UR: NEGATIVE mg/dL
Leukocytes, UA: NEGATIVE
NITRITE: NEGATIVE
PROTEIN: NEGATIVE mg/dL
Specific Gravity, Urine: 1.003 — ABNORMAL LOW (ref 1.005–1.030)
pH: 8 (ref 5.0–8.0)

## 2018-05-14 LAB — I-STAT TROPONIN, ED: Troponin i, poc: 0 ng/mL (ref 0.00–0.08)

## 2018-05-14 MED ORDER — ALBUTEROL SULFATE (2.5 MG/3ML) 0.083% IN NEBU
5.0000 mg | INHALATION_SOLUTION | Freq: Once | RESPIRATORY_TRACT | Status: AC
  Administered 2018-05-14: 5 mg via RESPIRATORY_TRACT
  Filled 2018-05-14: qty 6

## 2018-05-14 MED ORDER — IOPAMIDOL (ISOVUE-370) INJECTION 76%
100.0000 mL | Freq: Once | INTRAVENOUS | Status: AC | PRN
  Administered 2018-05-14: 100 mL via INTRAVENOUS

## 2018-05-14 MED ORDER — HYDROCODONE-ACETAMINOPHEN 5-325 MG PO TABS
1.0000 | ORAL_TABLET | Freq: Once | ORAL | Status: AC
  Administered 2018-05-14: 1 via ORAL
  Filled 2018-05-14: qty 1

## 2018-05-14 MED ORDER — IOPAMIDOL (ISOVUE-370) INJECTION 76%
INTRAVENOUS | Status: AC
  Filled 2018-05-14: qty 100

## 2018-05-14 NOTE — ED Provider Notes (Signed)
[facility] EMERGENCY DEPARTMENT Provider Note   CSN: [id number] Arrival date & time: 05/14/18  1922     History   Chief Complaint Chief Complaint  Patient presents with  . Chest Pain    HPI Natalie Thomas is a 57 y.o. female.  Patient is a 57 year old female presenting from her nursing home with new onset chest pain. PMH significant for HFpEF, COPD, IDDM, HTN, HLD, OA, h/o vulva cancer, anemia, schizoaffective disorder.  Chest pain began 3 days while patient was sitting on the end of her bed.  He has no prior history of chest pain.  She reports chronic shortness of breath but not worse with chest pain.  She did report some nausea and vomiting during the episodes.  Patient describes pain as sharp substernal pain radiating to back.  Patient says this is not alleviated or exacerbated by movement or exertion.  She has no prior history of cardiac surgery or work-up.  Patient is on home oxygen at 2L [place] during the day and at bedtime.  She is a current everyday smoker.  She denies alcohol or illicit drug use.     Past Medical History:  Diagnosis Date  . Acute on chronic respiratory failure with hypoxia and hypercapnia ([place]) 10/07/2017  . Anemia 10/16/2017  . Anxiety and depression   . CHF (congestive heart failure) ([place])   . Chronic hyponatremia 04/08/2007   Qualifier: Diagnosis of  By: Marca Ancona RMA, Lucy    . Chronic respiratory failure with hypoxia ([place]) 10/07/2017  . Closed comminuted intertrochanteric fracture of right femur ([place]) 11/07/2017  . COPD 04/08/2007   Qualifier: Diagnosis of  By: Marca Ancona RMA, Lucy    . Cough 05/08/2011  . Depression   . Diabetes mellitus, type 2 ([place])    pt denies but states that she has been treated for DM  . Dyspnea 10/16/2017  . Dysuria 04/08/2007   Qualifier: Diagnosis of  By: Reatha Armour, Lucy    . Essential hypertension 04/08/2007   Qualifier: Diagnosis of  By: Marca Ancona RMA, Lucy    . GERD (gastroesophageal reflux disease)   . HIP  PAIN, LEFT 12/12/2007   Qualifier: Diagnosis of  By: Loanne Drilling MD, Jacelyn Pi   . Hyperlipidemia 10/07/2017  . Hypertension   . Hyponatremia 07/27/2011  . Incontinence of urine 08/14/2011  . Lumbar disc disease 04/09/2011  . MENOPAUSE, PREMATURE 04/08/2007   Qualifier: Diagnosis of  By: Marca Ancona RMA, Lucy    . 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 Osteoporosis   . Osteoporosis 04/08/2007   Qualifier: Diagnosis of  By: Reatha Armour, Lucy    . Polycythemia secondary to smoking 04/09/2011  . S/P right hip fracture 11/07/2017  . Schizoaffective disorder, bipolar type ([place]) 07/05/2011    Patient Active Problem List   Diagnosis Date Noted  . S/P right hip fracture 11/07/2017  . Closed comminuted intertrochanteric fracture of right femur ([place]) 11/07/2017  . Anxiety and depression   . Dyspnea 10/16/2017  . Anemia 10/16/2017  . COPD exacerbation ([place]) 10/07/2017  . Hyperlipidemia 10/07/2017  . Chronic respiratory failure with hypoxia ([place]) 10/07/2017  . Acute on chronic respiratory failure with hypoxia and hypercapnia ([place]) 10/07/2017  . Incontinence of urine 08/14/2011  . Hyponatremia 07/27/2011  . Schizoaffective disorder, bipolar type ([place]) 07/05/2011  . Cough 05/08/2011  . Encounter for long-term (current) use of other medications 04/09/2011  . Lumbar disc disease 04/09/2011  . Polycythemia  secondary to smoking 04/09/2011  . HIP PAIN, LEFT 12/12/2007  . 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 DIABETES MELLITUS, TYPE II 04/08/2007  . MENOPAUSE, PREMATURE 04/08/2007  . Chronic hyponatremia 04/08/2007  . Schizoaffective disorder ([place]) 04/08/2007  . DEPRESSION 04/08/2007  . Essential hypertension 04/08/2007  . COPD 04/08/2007  . GERD 04/08/2007  . Osteoporosis 04/08/2007  . DYSURIA 04/08/2007    Past Surgical History:  Procedure Laterality Date  . EYE SURGERY     on left  eye, pt states that she sees bad out of the right eye and needs  surgery there  . INTRAMEDULLARY (IM) NAIL INTERTROCHANTERIC Right 11/13/2017   Procedure: INTRAMEDULLARY (IM) NAIL INTERTROCHANTRIC;  Surgeon: Shona Needles, MD;  Location: [facility];  Service: Orthopedics;  Laterality: Right;  . PELVIC FRACTURE SURGERY       OB History   None      Home Medications    Prior to Admission medications   Medication Sig Start Date End Date Taking? Authorizing Provider  acetaminophen (TYLENOL) 325 MG tablet Take 650 mg by mouth 2 (two) times daily as needed for mild pain.    Yes [provider]  albuterol (PROVENTIL HFA;VENTOLIN HFA) 108 (90 Base) MCG/ACT inhaler Inhale 2 puffs into the lungs every 4 (four) hours as needed for wheezing or shortness of breath. 07/05/17  Yes Ward, Delice Bison, DO  ALPRAZolam (XANAX) 0.5 MG tablet Take 1 tablet (0.5 mg total) by mouth 3 (three) times daily. 11/18/17  Yes Georgette Shell, MD  aspirin 81 MG tablet Take 1 tablet (81 mg total) by mouth daily. For heart health 08/06/11  Yes Greig Castilla, FNP  buPROPion (WELLBUTRIN XL) 150 MG 24 hr tablet Take 150 mg by mouth daily.   Yes [provider]  carvedilol (COREG) 25 MG tablet Take 25 mg by mouth 2 (two) times daily with a meal.   Yes [provider]  cloNIDine (CATAPRES - DOSED IN MG/24 HR) 0.3 mg/24hr patch Place 0.3 mg onto the skin once a week. On Monday   Yes [provider]  dicyclomine (BENTYL) 20 MG tablet Take 1 tablet (20 mg total) by mouth 2 (two) times daily. Patient taking differently: Take 20 mg by mouth 2 (two) times daily as needed for spasms.  11/04/17  Yes Recardo Evangelist, PA-C  divalproex (DEPAKOTE) 250 MG DR tablet Take 1 tablet (250 mg total) by mouth every 12 (twelve) hours. Patient taking differently: Take 250 mg by mouth 3 (three) times daily.  08/12/17  Yes Nita Sells, MD  docusate sodium (COLACE) 100 MG capsule Take 100 mg by mouth 2 (two) times daily as needed for mild constipation.   Yes [provider]  ferrous sulfate 325 (65 FE) MG tablet Take 325 mg by mouth daily with breakfast.   Yes [provider]  fluticasone (FLONASE) 50 MCG/ACT nasal spray Place 2 sprays into both nostrils 2 (two) times daily.   Yes [provider]  fluticasone furoate-vilanterol (BREO ELLIPTA) 100-25 MCG/INH AEPB Inhale 1 puff into the lungs daily.   Yes [provider]  furosemide (LASIX) 20 MG tablet Take 20 mg by mouth 2 (two) times daily.   Yes [provider]  gabapentin (NEURONTIN) 100 MG capsule Take 100 mg by mouth 2 (two) times daily as needed (leg pain).    Yes [provider]  hydrALAZINE (APRESOLINE) 100 MG tablet Take 100 mg by mouth 3 (three) times daily.   Yes [provider]  hydrOXYzine (ATARAX/VISTARIL) 25 MG tablet Take 1 tablet (25 mg total) by mouth 3 (three) times daily as needed for anxiety. Patient taking differently: Take 25 mg by mouth 3 (three) times daily.  10/19/17  Yes Aline August, MD  ipratropium-albuterol (DUONEB) 0.5-2.5 (3) MG/3ML SOLN Take 3 mLs by nebulization 3 (three) times daily as needed (sob).    Yes [provider]  lurasidone (LATUDA) 80 MG TABS tablet Take 1 tablet (80 mg total) by mouth daily with breakfast. Patient taking differently: Take 80 mg by mouth 2 (two) times daily.  08/12/17  Yes Nita Sells, MD  magnesium oxide (MAG-OX) 400 (241.3 Mg) MG tablet Take 2 tablets (800 mg total) by mouth 2 (two) times daily. Patient taking differently: Take 800 mg by mouth daily.  08/12/17  Yes Nita Sells, MD  Melatonin 10 MG TABS Take 10 mg by mouth at bedtime.   Yes [provider]  NIFEdipine (PROCARDIA XL/ADALAT-CC) 90 MG 24 hr tablet Take 90 mg by mouth daily.   Yes [provider]  nystatin cream (MYCOSTATIN) Apply 1 application topically daily.   Yes [provider]  omeprazole (PRILOSEC) 20 MG capsule Take 1 capsule (20 mg total) by mouth 2 (two) times  daily before a meal. For acid reflux. Patient taking differently: Take 20 mg by mouth daily. For acid reflux. 10/19/17  Yes Aline August, MD  oxyCODONE (OXY IR/ROXICODONE) 5 MG immediate release tablet Take 5 mg by mouth every 6 (six) hours as needed for severe pain.   Yes [provider]  [place] (OYSTER CALCIUM) 500 MG TABS tablet Take 500 mg of elemental calcium by mouth daily.   Yes [provider]  PARoxetine (PAXIL) 30 MG tablet Take 30 mg by mouth daily.   Yes [provider]  polyethylene glycol (MIRALAX) packet Take 17 g by mouth daily as needed. Patient taking differently: Take 17 g by mouth daily.  10/19/17  Yes Aline August, MD  pravastatin (PRAVACHOL) 20 MG tablet Take 20 mg by mouth daily.   Yes [provider]  Saccharomyces boulardii (PROBIOTIC) 250 MG CAPS Take 1 capsule by mouth daily.   Yes [provider]  sodium chloride 1 g tablet Take 1 g by mouth 3 (three) times daily.   Yes [provider]  traZODone (DESYREL) 100 MG tablet Take 100 mg by mouth at bedtime.   Yes [provider]  buPROPion (WELLBUTRIN XL) 300 MG 24 hr tablet Take 1 tablet (300 mg total) by mouth every morning. Patient not taking: Reported on 04/17/2018 10/19/17 10/19/18  Aline August, MD  enoxaparin (LOVENOX) 40 MG/0.4ML injection Inject 0.4 mLs (40 mg total) into the skin daily. Patient not taking: Reported on 12/20/2017 11/18/17   Georgette Shell, MD  levofloxacin (LEVAQUIN) 750 MG tablet Take 1 tablet (750 mg total) by mouth daily. X 7 days Patient not taking: Reported on 02/27/2018 12/24/17   Margarita Mail, PA-C  PARoxetine (PAXIL) 20 MG tablet Take 1 tablet (20 mg total) by mouth daily. Patient not taking: Reported on 04/17/2018 10/19/17   Aline August, MD    Family History History reviewed. No pertinent family history.  Social History Social History   Tobacco Use  . Smoking status: Current Every Day Smoker    Packs/day:  0.25    Types: Cigarettes  . Smokeless tobacco: Never Used  Substance Use Topics  . Alcohol use: No  . Drug use: No     Allergies   Clarithromycin and Penicillins  Review of Systems Review of Systems  Constitutional: Negative for chills, diaphoresis, fatigue and fever.  HENT: Negative for congestion and sore throat.   Eyes: Negative for pain and visual disturbance.  Respiratory: Negative for cough, shortness of breath and wheezing.   Cardiovascular: Positive for chest pain and leg swelling. Negative for palpitations.  Gastrointestinal: Positive for nausea and vomiting. Negative for abdominal pain, blood in stool, constipation and diarrhea.  Genitourinary: Positive for dysuria. Negative for frequency and hematuria.  Musculoskeletal: Positive for back pain. Negative for myalgias.  Skin: Negative for color change and rash.  Neurological: Negative for dizziness and headaches.  All other systems reviewed and are negative.    Physical Exam Updated Vital Signs BP (!) 183/86   Pulse 63   Temp 98 F (36.7 C) (Oral)   Resp 15   Ht 5\' 5"  (1.651 m)   Wt 84.8 kg   SpO2 100%   BMI 31.12 kg/m   Physical Exam  Constitutional: She is oriented to person, place, and time. She appears well-developed and well-nourished.  Non-toxic appearance. No distress.  HENT:  Head: Normocephalic and atraumatic.  Eyes: Conjunctivae are normal.  Neck: Neck supple. No JVD present. No tracheal deviation present.  Cardiovascular: Normal rate and regular rhythm.  No murmur heard. Pulmonary/Chest: Effort normal and breath sounds normal. No respiratory distress. She has no wheezes. She has no rhonchi. She has no rales.  On 2L [place]  Abdominal: Soft. There is no tenderness.  Musculoskeletal:  Bilateral 2+ lower extremity edema up to knees  Neurological: She is alert and oriented to person, place, and time.  Skin: Skin is warm and dry. No rash noted. She is not diaphoretic. No pallor.  Psychiatric: She  has a normal mood and affect.  Nursing note and vitals reviewed.    ED Treatments / Results  Labs (all labs ordered are listed, but only abnormal results are displayed) Labs Reviewed  CBC - Abnormal; Notable for the following components:      Result Value   Hemoglobin 11.6 (*)    HCT 35.1 (*)    All other components within normal limits  URINALYSIS, ROUTINE W REFLEX MICROSCOPIC - Abnormal; Notable for the following components:   Color, Urine STRAW (*)    Specific Gravity, Urine 1.003 (*)    All other components within normal limits  BASIC METABOLIC PANEL - Abnormal; Notable for the following components:   Sodium 128 (*)    Chloride 92 (*)    Glucose, Bld 106 (*)    Calcium 8.7 (*)    All other components within normal limits  LIPASE, BLOOD  I-STAT TROPONIN, ED    EKG None  Radiology Dg Chest Port 1 View  Result Date: 05/14/2018 CLINICAL DATA:  Chest pain for 3 days with right arm numbness in shortness of breath. EXAM: PORTABLE CHEST 1 VIEW COMPARISON:  04/17/2018 FINDINGS: Mild-to-moderate enlargement of the cardiopericardial silhouette. Atherosclerotic calcification of the aortic arch. Indistinct airspace opacity in the right mid lung and right lung base. Bilateral interstitial accentuation favoring the lung bases. Mild airway thickening centrally. IMPRESSION: 1. Airspace opacity in the right mid lung and right lung base, suspicious for pneumonia but conceivably from asymmetric or resolving edema. 2. Mild-to-moderate enlargement of the cardiopericardial silhouette. 3. Airway thickening is present, suggesting bronchitis or reactive airways disease. 4. Atherosclerotic aortic arch. Electronically Signed   By: Van Clines M.D.   On: 05/14/2018 19:53    Procedures Procedures (including critical care time)  Medications Ordered  in ED Medications  iopamidol (ISOVUE-370) 76 % injection (has no administration in time range)  albuterol (PROVENTIL) (2.5 MG/3ML) 0.083%  nebulizer solution 5 mg (5 mg Nebulization Given 05/14/18 2126)  HYDROcodone-acetaminophen (NORCO/VICODIN) 5-325 MG per tablet 1 tablet (1 tablet Oral Given 05/14/18 2126)  iopamidol (ISOVUE-370) 76 % injection 100 mL (100 mLs Intravenous Contrast Given 05/14/18 2221)     Initial Impression / Assessment and Plan / ED Course  I have reviewed the triage vital signs and the nursing notes.  Pertinent labs & imaging results that were available during my care of the patient were reviewed by me and considered in my medical decision making (see chart for details).  Patient is a 57 year old female presenting from her nursing home with acute onset chest pain.  Vital signs significant for hypertension at 181/65, afebrile with NSR at 64, RR 13, O2 95% on 2L [place] which is patient's baseline.  Chest pain is atypical based on patient's history.  She did receive 4 baby aspirin and nitro at her nursing home with improvement.  She continues to have sharp chest pain radiating from her sternum to her back.  Patient also endorses left flank pain with a history of dysuria.  Will check lipase and urinalysis.  EKG borderline bradycardia at 60 bpm without ST changes or T wave abnormalities, TC 448.  CXR consistent with mild to moderate enlargement of the cardiopericardial silhouette, with right mid lung and right lung base opacity suspicious for pneumonia versus resolving edema.  WBC 6.8 and patient is anemic at 11.6 which is her baseline. Hyponatremia 128 at baseline, creatinine 0.64.  UA negative. Lipase negative.  Well score 4.5 for PE.  Will proceed with CTA for better visualization of pneumonia versus edema and rule out PE.  Patient requesting albuterol treatment for wheeze. Will give albuterol neb. Also requesting chronic pain medication oxycodone. PWP reviewed. Given single dose of Norco 5-325. Awaiting CTA. Dr. Venora Maples will follow remainder of patients care. Sign out provided.  Final Clinical Impressions(s) / ED  Diagnoses   Final diagnoses:  None    ED Discharge Orders    None       [place] Bing, DO 05/14/18 2232    Jola Schmidt, MD 05/15/18 [phone]
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
## 2018-05-14 NOTE — ED Triage Notes (Signed)
Per ems pt having chest pain 4/10 x 3 days. Right arm is also numb. Pt having SOB with cp. Pt received 324 aspirin and 1 nitro en route. Nitro brought CP down to 3/10

## 2018-05-15 DIAGNOSIS — J189 Pneumonia, unspecified organism: Secondary | ICD-10-CM | POA: Diagnosis not present

## 2018-05-15 MED ORDER — LEVOFLOXACIN 500 MG PO TABS
500.0000 mg | ORAL_TABLET | Freq: Once | ORAL | Status: AC
  Administered 2018-05-15: 500 mg via ORAL
  Filled 2018-05-15: qty 1

## 2018-05-15 MED ORDER — ACETAMINOPHEN 500 MG PO TABS
1000.0000 mg | ORAL_TABLET | Freq: Once | ORAL | Status: AC
  Administered 2018-05-15: 1000 mg via ORAL
  Filled 2018-05-15: qty 2

## 2018-05-15 MED ORDER — LEVOFLOXACIN 500 MG PO TABS: 500.0000 mg | ORAL_TABLET | Freq: Every day | ORAL | 0 refills | Status: DC

## 2018-05-15 NOTE — ED Notes (Signed)
GAVE REPORT TO CHANTELLE AT [facility]

## 2018-05-15 NOTE — ED Notes (Signed)
Contacted [facility] for tx back to [facility].

## 2018-05-29 ENCOUNTER — Other Ambulatory Visit: Payer: Self-pay

## 2018-05-29 ENCOUNTER — Emergency Department (HOSPITAL_COMMUNITY)
Admission: EM | Admit: 2018-05-29 | Discharge: 2018-05-30 | Disposition: A | Discharge status: Home / Self Care | Payer: Medicare Other | Attending: Emergency Medicine | Admitting: Emergency Medicine

## 2018-05-29 ENCOUNTER — Encounter (HOSPITAL_COMMUNITY): Payer: Self-pay | Admitting: Emergency Medicine

## 2018-05-29 ENCOUNTER — Emergency Department (HOSPITAL_COMMUNITY): Payer: Medicare Other

## 2018-05-29 DIAGNOSIS — J441 Chronic obstructive pulmonary disease with (acute) exacerbation: Secondary | ICD-10-CM | POA: Diagnosis not present

## 2018-05-29 DIAGNOSIS — Z720 Tobacco use: Secondary | ICD-10-CM

## 2018-05-29 DIAGNOSIS — Z7982 Long term (current) use of aspirin: Secondary | ICD-10-CM | POA: Diagnosis not present

## 2018-05-29 DIAGNOSIS — Z79899 Other long term (current) drug therapy: Secondary | ICD-10-CM | POA: Diagnosis not present

## 2018-05-29 DIAGNOSIS — I110 Hypertensive heart disease with heart failure: Secondary | ICD-10-CM | POA: Diagnosis not present

## 2018-05-29 DIAGNOSIS — R0602 Shortness of breath: Secondary | ICD-10-CM | POA: Diagnosis present

## 2018-05-29 DIAGNOSIS — F17210 Nicotine dependence, cigarettes, uncomplicated: Secondary | ICD-10-CM | POA: Diagnosis not present

## 2018-05-29 DIAGNOSIS — E119 Type 2 diabetes mellitus without complications: Secondary | ICD-10-CM | POA: Diagnosis not present

## 2018-05-29 DIAGNOSIS — I509 Heart failure, unspecified: Secondary | ICD-10-CM | POA: Diagnosis not present

## 2018-05-29 LAB — CBC WITH DIFFERENTIAL/PLATELET
ABS IMMATURE GRANULOCYTES: 0.01 10*3/uL (ref 0.00–0.07)
Basophils Absolute: 0 10*3/uL (ref 0.0–0.1)
Basophils Relative: 0 %
Eosinophils Absolute: 0.1 10*3/uL (ref 0.0–0.5)
Eosinophils Relative: 1 %
HCT: 34.7 % — ABNORMAL LOW (ref 36.0–46.0)
HEMOGLOBIN: 11 g/dL — AB (ref 12.0–15.0)
Immature Granulocytes: 0 %
LYMPHS PCT: 35 %
Lymphs Abs: 1.5 10*3/uL (ref 0.7–4.0)
MCH: 28.7 pg (ref 26.0–34.0)
MCHC: 31.7 g/dL (ref 30.0–36.0)
MCV: 90.6 fL (ref 80.0–100.0)
MONO ABS: 0.5 10*3/uL (ref 0.1–1.0)
Monocytes Relative: 12 %
NEUTROS ABS: 2.3 10*3/uL (ref 1.7–7.7)
Neutrophils Relative %: 52 %
Platelets: 167 10*3/uL (ref 150–400)
RBC: 3.83 MIL/uL — AB (ref 3.87–5.11)
RDW: 12.4 % (ref 11.5–15.5)
WBC: 4.3 10*3/uL (ref 4.0–10.5)
nRBC: 0 % (ref 0.0–0.2)

## 2018-05-29 LAB — I-STAT VENOUS BLOOD GAS, ED
ACID-BASE EXCESS: 12 mmol/L — AB (ref 0.0–2.0)
BICARBONATE: 38.4 mmol/L — AB (ref 20.0–28.0)
O2 Saturation: 91 %
PO2 VEN: 61 mmHg — AB (ref 32.0–45.0)
TCO2: 40 mmol/L — ABNORMAL HIGH (ref 22–32)
pCO2, Ven: 54.4 mmHg (ref 44.0–60.0)
pH, Ven: 7.457 — ABNORMAL HIGH (ref 7.250–7.430)

## 2018-05-29 LAB — BASIC METABOLIC PANEL
Anion gap: 13 (ref 5–15)
BUN: 7 mg/dL (ref 6–20)
CHLORIDE: 87 mmol/L — AB (ref 98–111)
CO2: 28 mmol/L (ref 22–32)
Calcium: 8.6 mg/dL — ABNORMAL LOW (ref 8.9–10.3)
Creatinine, Ser: 0.74 mg/dL (ref 0.44–1.00)
GFR calc Af Amer: >60 mL/min (ref >60)
GFR calc non Af Amer: >60 mL/min (ref >60)
GLUCOSE: 120 mg/dL — AB (ref 70–99)
POTASSIUM: 4.1 mmol/L (ref 3.5–5.1)
Sodium: 128 mmol/L — ABNORMAL LOW (ref 135–145)

## 2018-05-29 LAB — TROPONIN I

## 2018-05-29 MED ORDER — KETOROLAC TROMETHAMINE 30 MG/ML IJ SOLN
15.0000 mg | Freq: Once | INTRAMUSCULAR | Status: AC
  Administered 2018-05-29: 15 mg via INTRAVENOUS
  Filled 2018-05-29: qty 1

## 2018-05-29 MED ORDER — METHYLPREDNISOLONE SODIUM SUCC 125 MG IJ SOLR
125.0000 mg | Freq: Once | INTRAMUSCULAR | Status: AC
  Administered 2018-05-29: 125 mg via INTRAVENOUS
  Filled 2018-05-29: qty 2

## 2018-05-29 MED ORDER — PREDNISONE 20 MG PO TABS
60.0000 mg | ORAL_TABLET | Freq: Once | ORAL | Status: DC
  Filled 2018-05-29: qty 3

## 2018-05-29 NOTE — ED Notes (Signed)
Pt asking for her "xanax and something to rest." Notified MD.

## 2018-05-29 NOTE — ED Triage Notes (Signed)
Pt arrives to ED from [facility] with complaints of Shortness of breath since 2 weeks. EMS reports Pt was recently d/c with dx of pneumonia. Pt finished oral antibiotics Levaquin on 12/1 and has since still had shortness of breath but today was worse. Pt has hx of COPD and wears 2L O2 at all times. Pt has hx of CHF and has bilateral edema. Pt also complaining of intermittent left sided chest tightness with a dull feeling. Pt placed in position of comfort with bed locked and lowered, call bell in reach.

## 2018-05-29 NOTE — ED Notes (Signed)
EMS gave 10 Albuterol, 0.5 [place]

## 2018-05-29 NOTE — ED Notes (Signed)
ED Provider at bedside. 

## 2018-05-29 NOTE — ED Notes (Signed)
Patient transported to X-ray 

## 2018-05-29 NOTE — ED Provider Notes (Signed)
[facility] EMERGENCY DEPARTMENT Provider Note   CSN: [id number] Arrival date & time: 05/29/18  2008     History   Chief Complaint Chief Complaint  Patient presents with  . Shortness of Breath  . Chest Pain    HPI Natalie Thomas is a 57 y.o. female.  HPI  Is here for evaluation of shortness of breath with cough productive of green sputum.  She was treated for pneumonia about 3 weeks ago, with Levaquin, finished that prescription 4 days ago.  She is concerned about swelling in her lower legs.  She feels short of breath today.  She wears oxygen nasal cannula at 2 L all the time.  She came by EMS and received albuterol nebulizers during transport.  She lives in a nursing care facility.  There are no other known modifying factors.   Past Medical History:  Diagnosis Date  . Acute on chronic respiratory failure with hypoxia and hypercapnia ([place]) 10/07/2017  . Anemia 10/16/2017  . Anxiety and depression   . CHF (congestive heart failure) ([place])   . Chronic hyponatremia 04/08/2007   Qualifier: Diagnosis of  By: Marca Ancona RMA, Lucy    . Chronic respiratory failure with hypoxia ([place]) 10/07/2017  . Closed comminuted intertrochanteric fracture of right femur ([place]) 11/07/2017  . COPD 04/08/2007   Qualifier: Diagnosis of  By: Marca Ancona RMA, Lucy    . Cough 05/08/2011  . Depression   . Diabetes mellitus, type 2 ([place])    pt denies but states that she has been treated for DM  . Dyspnea 10/16/2017  . Dysuria 04/08/2007   Qualifier: Diagnosis of  By: Reatha Armour, Lucy    . Essential hypertension 04/08/2007   Qualifier: Diagnosis of  By: Marca Ancona RMA, Lucy    . GERD (gastroesophageal reflux disease)   . HIP PAIN, LEFT 12/12/2007   Qualifier: Diagnosis of  By: Loanne Drilling MD, Jacelyn Pi   . Hyperlipidemia 10/07/2017  . Hypertension   . Hyponatremia 07/27/2011  . Incontinence of urine 08/14/2011  . Lumbar disc disease 04/09/2011  . MENOPAUSE, PREMATURE 04/08/2007   Qualifier: Diagnosis of  By:  Marca Ancona RMA, Lucy    . 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 Osteoporosis   . Osteoporosis 04/08/2007   Qualifier: Diagnosis of  By: Reatha Armour, Lucy    . Polycythemia secondary to smoking 04/09/2011  . S/P right hip fracture 11/07/2017  . Schizoaffective disorder, bipolar type ([place]) 07/05/2011    Patient Active Problem List   Diagnosis Date Noted  . S/P right hip fracture 11/07/2017  . Closed comminuted intertrochanteric fracture of right femur ([place]) 11/07/2017  . Anxiety and depression   . Dyspnea 10/16/2017  . Anemia 10/16/2017  . COPD exacerbation ([place]) 10/07/2017  . Hyperlipidemia 10/07/2017  . Chronic respiratory failure with hypoxia ([place]) 10/07/2017  . Acute on chronic respiratory failure with hypoxia and hypercapnia ([place]) 10/07/2017  . Incontinence of urine 08/14/2011  . Hyponatremia 07/27/2011  . Schizoaffective disorder, bipolar type ([employer]) 07/05/2011  . Cough 05/08/2011  . Encounter for long-term (current) use of other medications 04/09/2011  . Lumbar disc disease 04/09/2011  . Polycythemia secondary to smoking 04/09/2011  . HIP PAIN, LEFT 12/12/2007  . 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 DIABETES MELLITUS, TYPE II 04/08/2007  . MENOPAUSE, PREMATURE 04/08/2007  . Chronic hyponatremia 04/08/2007  . Schizoaffective disorder ([place]) 04/08/2007  . DEPRESSION 04/08/2007  . Essential hypertension 04/08/2007  . COPD  04/08/2007  . GERD 04/08/2007  . Osteoporosis 04/08/2007  . DYSURIA 04/08/2007    Past Surgical History:  Procedure Laterality Date  . EYE SURGERY     on left  eye, pt states that she sees bad out of the right eye and needs surgery there  . INTRAMEDULLARY (IM) NAIL INTERTROCHANTERIC Right 11/13/2017   Procedure: INTRAMEDULLARY (IM) NAIL INTERTROCHANTRIC;  Surgeon: Shona Needles, MD;  Location: [facility];  Service: Orthopedics;  Laterality: Right;  . PELVIC FRACTURE SURGERY       OB History   None       Home Medications    Prior to Admission medications   Medication Sig Start Date End Date Taking? Authorizing Provider  acetaminophen (TYLENOL) 325 MG tablet Take 650 mg by mouth 2 (two) times daily as needed for mild pain.    Yes [provider]  albuterol (PROVENTIL HFA;VENTOLIN HFA) 108 (90 Base) MCG/ACT inhaler Inhale 2 puffs into the lungs every 4 (four) hours as needed for wheezing or shortness of breath. 07/05/17  Yes Ward, Delice Bison, DO  ALPRAZolam (XANAX) 0.5 MG tablet Take 1 tablet (0.5 mg total) by mouth 3 (three) times daily. 11/18/17  Yes Georgette Shell, MD  aspirin 81 MG tablet Take 1 tablet (81 mg total) by mouth daily. For heart health 08/06/11  Yes Greig Castilla, FNP  benzonatate (TESSALON) 100 MG capsule Take 100 mg by mouth 3 (three) times daily.   Yes [provider]  buPROPion (WELLBUTRIN XL) 150 MG 24 hr tablet Take 150 mg by mouth daily.   Yes [provider]  carvedilol (COREG) 25 MG tablet Take 25 mg by mouth 2 (two) times daily with a meal.   Yes [provider]  cloNIDine (CATAPRES - DOSED IN MG/24 HR) 0.3 mg/24hr patch Place 0.3 mg onto the skin once a week. On Monday   Yes [provider]  dicyclomine (BENTYL) 20 MG tablet Take 1 tablet (20 mg total) by mouth 2 (two) times daily. Patient taking differently: Take 20 mg by mouth 2 (two) times daily as needed for spasms.  11/04/17  Yes Recardo Evangelist, PA-C  divalproex (DEPAKOTE) 250 MG DR tablet Take 1 tablet (250 mg total) by mouth every 12 (twelve) hours. Patient taking differently: Take 250 mg by mouth 3 (three) times daily.  08/12/17  Yes Nita Sells, MD  docusate sodium (COLACE) 100 MG capsule Take 100 mg by mouth 2 (two) times daily as needed for mild constipation.   Yes [provider]  ferrous sulfate 325 (65 FE) MG tablet Take 325 mg by mouth daily with breakfast.   Yes [provider]  fluticasone (FLONASE) 50 MCG/ACT nasal  spray Place 2 sprays into both nostrils 2 (two) times daily.   Yes [provider]  fluticasone furoate-vilanterol (BREO ELLIPTA) 100-25 MCG/INH AEPB Inhale 1 puff into the lungs daily.   Yes [provider]  furosemide (LASIX) 20 MG tablet Take 20 mg by mouth 2 (two) times daily.   Yes [provider]  gabapentin (NEURONTIN) 100 MG capsule Take 100 mg by mouth 2 (two) times daily.    Yes [provider]  guaiFENesin (MUCINEX) 600 MG 12 hr tablet Take 600 mg by mouth 2 (two) times daily.   Yes [provider]  hydrALAZINE (APRESOLINE) 100 MG tablet Take 100 mg by mouth 3 (three) times daily.   Yes [provider]  hydrOXYzine (ATARAX/VISTARIL) 25 MG tablet Take 1 tablet (25 mg  total) by mouth 3 (three) times daily as needed for anxiety. Patient taking differently: Take 25 mg by mouth 3 (three) times daily.  10/19/17  Yes Aline August, MD  ipratropium-albuterol (DUONEB) 0.5-2.5 (3) MG/3ML SOLN Take 3 mLs by nebulization See admin instructions. Use three times daily and every 8 hours as needed for SOB   Yes [provider]  lurasidone (LATUDA) 80 MG TABS tablet Take 1 tablet (80 mg total) by mouth daily with breakfast. Patient taking differently: Take 80 mg by mouth 2 (two) times daily.  08/12/17  Yes Nita Sells, MD  magnesium oxide (MAG-OX) 400 (241.3 Mg) MG tablet Take 2 tablets (800 mg total) by mouth 2 (two) times daily. Patient taking differently: Take 800 mg by mouth daily.  08/12/17  Yes Nita Sells, MD  Melatonin 10 MG TABS Take 10 mg by mouth at bedtime.   Yes [provider]  NIFEdipine (PROCARDIA XL/ADALAT-CC) 90 MG 24 hr tablet Take 90 mg by mouth daily.   Yes [provider]  nystatin cream (MYCOSTATIN) Apply 1 application topically daily.   Yes [provider]  omeprazole (PRILOSEC) 20 MG capsule Take 1 capsule (20 mg total) by mouth 2 (two) times daily before a meal. For acid  reflux. Patient taking differently: Take 20 mg by mouth daily. For acid reflux. 10/19/17  Yes Aline August, MD  oseltamivir (TAMIFLU) 75 MG capsule Take 75 mg by mouth 2 (two) times daily.   Yes [provider]  oxyCODONE (OXY IR/ROXICODONE) 5 MG immediate release tablet Take 5 mg by mouth every 6 (six) hours as needed for severe pain.   Yes [provider]  [place] (OYSTER CALCIUM) 500 MG TABS tablet Take 500 mg of elemental calcium by mouth daily.   Yes [provider]  PARoxetine (PAXIL) 30 MG tablet Take 30 mg by mouth daily.   Yes [provider]  polyethylene glycol (MIRALAX) packet Take 17 g by mouth daily as needed. Patient taking differently: Take 17 g by mouth daily.  10/19/17  Yes Aline August, MD  pravastatin (PRAVACHOL) 20 MG tablet Take 20 mg by mouth daily.   Yes [provider]  Saccharomyces boulardii (PROBIOTIC) 250 MG CAPS Take 1 capsule by mouth daily.   Yes [provider]  sodium chloride 1 g tablet Take 1 g by mouth 3 (three) times daily.   Yes [provider]  traZODone (DESYREL) 100 MG tablet Take 100 mg by mouth at bedtime.   Yes [provider]  buPROPion (WELLBUTRIN XL) 300 MG 24 hr tablet Take 1 tablet (300 mg total) by mouth every morning. Patient not taking: Reported on 04/17/2018 10/19/17 10/19/18  Aline August, MD  enoxaparin (LOVENOX) 40 MG/0.4ML injection Inject 0.4 mLs (40 mg total) into the skin daily. Patient not taking: Reported on 12/20/2017 11/18/17   Georgette Shell, MD  levofloxacin (LEVAQUIN) 500 MG tablet Take 1 tablet (500 mg total) by mouth daily. Patient not taking: Reported on 05/29/2018 05/15/18   Jola Schmidt, MD  PARoxetine (PAXIL) 20 MG tablet Take 1 tablet (20 mg total) by mouth daily. Patient not taking: Reported on 04/17/2018 10/19/17   Aline August, MD  predniSONE (DELTASONE) 10 MG tablet Take q day 6,5,4,3,2,1 05/30/18   Daleen Bo, MD    Family  History No family history on file.  Social History Social History   Tobacco Use  . Smoking status: Current Every Day Smoker    Packs/day: 0.25    Types: Cigarettes  .  Smokeless tobacco: Never Used  Substance Use Topics  . Alcohol use: No  . Drug use: No     Allergies   Clarithromycin and Penicillins   Review of Systems Review of Systems  All other systems reviewed and are negative.    Physical Exam Updated Vital Signs BP (!) 184/62   Pulse 66   Temp 98.5 F (36.9 C)   Resp 18   Ht 5\' 5"  (1.651 m)   Wt 84 kg   SpO2 96%   BMI 30.82 kg/m   Physical Exam  Constitutional: She is oriented to person, place, and time. She appears well-developed. She does not appear ill. No distress.  Appears older than stated age.  HENT:  Head: Normocephalic and atraumatic.  Eyes: Pupils are equal, round, and reactive to light. Conjunctivae and EOM are normal.  Neck: Normal range of motion and phonation normal. Neck supple.  Cardiovascular: Normal rate and regular rhythm.  Pulmonary/Chest: Effort normal. No stridor. No respiratory distress. She has no rales. She exhibits no tenderness.  Decreased air movement bilaterally, with scattered rhonchi and wheezes.  Abdominal: Soft. She exhibits no distension. There is no tenderness. There is no guarding.  Musculoskeletal: Normal range of motion.  Trace lower extremity edema bilaterally.  Small contusion left lower leg, with purple ecchymosis.  No break in the skin.  Neurological: She is alert and oriented to person, place, and time. She exhibits normal muscle tone.  Skin: Skin is warm and dry.  Psychiatric: She has a normal mood and affect. Her behavior is normal. Judgment and thought content normal.  Nursing note and vitals reviewed.    ED Treatments / Results  Labs (all labs ordered are listed, but only abnormal results are displayed) Labs Reviewed  BASIC METABOLIC PANEL - Abnormal; Notable for the following components:      Result  Value   Sodium 128 (*)    Chloride 87 (*)    Glucose, Bld 120 (*)    Calcium 8.6 (*)    All other components within normal limits  CBC WITH DIFFERENTIAL/PLATELET - Abnormal; Notable for the following components:   RBC 3.83 (*)    Hemoglobin 11.0 (*)    HCT 34.7 (*)    All other components within normal limits  I-STAT VENOUS BLOOD GAS, ED - Abnormal; Notable for the following components:   pH, Ven 7.457 (*)    pO2, Ven 61.0 (*)    Bicarbonate 38.4 (*)    TCO2 40 (*)    Acid-Base Excess 12.0 (*)    All other components within normal limits  TROPONIN I    EKG EKG Interpretation  Date/Time:  Thursday May 29 2018 20:18:18 EST Ventricular Rate:  61 PR Interval:    QRS Duration: 76 QT Interval:  477 QTC Calculation: 481 R Axis:   75 Text Interpretation:  Sinus rhythm Anteroseptal infarct, age indeterminate since last tracing no significant change Confirmed by Daleen Bo [phone]) on 05/29/2018 11:08:56 PM   Radiology Dg Chest 2 View  Result Date: 05/29/2018 CLINICAL DATA:  Cough and shortness of breath. EXAM: CHEST - 2 VIEW COMPARISON:  05/14/2018 FINDINGS: Blunting at the left costophrenic angle and suggestive for small left pleural effusion. Cannot exclude a small right pleural effusion. Prominent lung markings appear chronic. Heart size is upper limits of normal and stable. Atherosclerotic calcifications at the aortic arch. Again noted are multiple compression deformities throughout the thoracic spine. Old right eighth rib fracture. IMPRESSION: Left basilar densities are suggestive for a  small left pleural effusion and atelectasis. Cannot exclude a small right pleural effusion. Electronically Signed   By: Markus Daft M.D.   On: 05/29/2018 21:35    Procedures .Critical Care Performed by: Daleen Bo, MD Authorized by: Daleen Bo, MD   Critical care provider statement:    Critical care time (minutes):  35   Critical care start time:  05/29/2018 8:25 PM   Critical  care end time:  05/30/2018 12:30 AM   Critical care time was exclusive of:  Separately billable procedures and treating other patients   Critical care was necessary to treat or prevent imminent or life-threatening deterioration of the following conditions:  Respiratory failure   Critical care was time spent personally by me on the following activities:  Blood draw for specimens, development of treatment plan with patient or surrogate, discussions with consultants, evaluation of patient's response to treatment, examination of patient, obtaining history from patient or surrogate, ordering and performing treatments and interventions, ordering and review of laboratory studies, pulse oximetry, re-evaluation of patient's condition, review of old charts and ordering and review of radiographic studies   (including critical care time)  Medications Ordered in ED Medications  methylPREDNISolone sodium succinate (SOLU-MEDROL) 125 mg/2 mL injection 125 mg (125 mg Intravenous Given 05/29/18 2124)  ketorolac (TORADOL) 30 MG/ML injection 15 mg (15 mg Intravenous Given 05/29/18 2124)  ALPRAZolam Duanne Moron) tablet 0.5 mg (0.5 mg Oral Given 05/30/18 0024)     Initial Impression / Assessment and Plan / ED Course  I have reviewed the triage vital signs and the nursing notes.  Pertinent labs & imaging results that were available during my care of the patient were reviewed by me and considered in my medical decision making (see chart for details).  Clinical Course as of May 30 1006  Thu May 29, 2018  2318 States she is still wheezing.  Oxygen was lowered to 2 L, by me.  She is currently sitting on a bedpan.   [EW]  2318 Abnormal, venous blood gas-mild elevation of bicarbonate indicating chronic respiratory disease.  I-Stat venous blood gas, ED(!) [EW]  2319 Normal except hemoglobin low  CBC with Differential(!) [EW]  2319 Normal except sodium low, chloride low, glucose high, calcium low  Basic metabolic panel(!) [EW]   8676 Normal  Troponin I - Once [EW]    Clinical Course User Index [EW] Daleen Bo, MD     Patient Vitals for the past 24 hrs:  BP Temp Temp src Pulse Resp SpO2 Height Weight  05/30/18 0516 (!) 184/62 98.5 F (36.9 C) - 66 18 96 % - -  05/30/18 0000 (!) 184/62 - - 66 18 93 % - -  05/29/18 2300 (!) 179/65 - - 60 16 93 % - -  05/29/18 2230 (!) 176/63 - - 61 15 92 % - -  05/29/18 2200 (!) 158/63 - - (!) 55 (!) 21 93 % - -  05/29/18 2130 (!) 154/73 - - (!) 54 16 91 % - -  05/29/18 2100 (!) 146/57 - - 61 14 96 % - -  05/29/18 2026 - - - - - - 5\' 5"  (1.651 m) 84 kg  05/29/18 2025 (!) 175/58 98.6 F (37 C) Oral (!) 57 16 100 % - -  05/29/18 2024 - - - - - 94 % - -    00:30 AM Reevaluation with update and discussion. After initial assessment and treatment, an updated evaluation reveals she is comfortable now.  No respiratory distress.  Oxygen  saturation 93% on 2 L nasal cannula.  Findings discussed with the patient and all questions were answered. Daleen Bo   Medical Decision Making: Evaluation consistent with COPD exacerbation.  Doubt CHF, ACS or metabolic instability.  Patient improved after treatment and stable for discharge.  CRITICAL CARE-yes Performed by: Daleen Bo  Nursing Notes Reviewed/ Care Coordinated Applicable Imaging Reviewed Interpretation of Laboratory Data incorporated into ED treatment  The patient appears reasonably screened and/or stabilized for discharge and I doubt any other medical condition or other [facility] requiring further screening, evaluation, or treatment in the ED at this time prior to discharge.  Plan: Home Medications-continue routine medications; Home Treatments-gradually advance activity; return here if the recommended treatment, does not improve the symptoms; Recommended follow up-PCP checkup in 3 to 5 days and as needed   Final Clinical Impressions(s) / ED Diagnoses   Final diagnoses:  COPD exacerbation ([place])  Tobacco abuse    ED  Discharge Orders         Ordered    predniSONE (DELTASONE) 10 MG tablet     05/30/18 0015           Daleen Bo, MD 05/30/18 1011

## 2018-05-30 DIAGNOSIS — J441 Chronic obstructive pulmonary disease with (acute) exacerbation: Secondary | ICD-10-CM | POA: Diagnosis not present

## 2018-05-30 MED ORDER — ALPRAZOLAM 0.25 MG PO TABS
0.5000 mg | ORAL_TABLET | Freq: Once | ORAL | Status: AC
  Administered 2018-05-30: 0.5 mg via ORAL
  Filled 2018-05-30: qty 2

## 2018-05-30 MED ORDER — PREDNISONE 10 MG PO TABS: ORAL_TABLET | ORAL | 0 refills | Status: DC

## 2018-05-30 NOTE — Discharge Instructions (Signed)
It is very important to stop smoking.  Start the prednisone prescription tomorrow morning.  Use your usual treatments including oxygen and nebulizer as needed.

## 2018-05-30 NOTE — ED Notes (Signed)
ED Provider at bedside. 

## 2018-05-30 NOTE — ED Notes (Signed)
Called ptar for pt, Natalie Thomas  

## 2018-05-30 NOTE — ED Notes (Signed)
[facility] at bedside to transport patient 

## 2018-05-30 NOTE — ED Notes (Signed)
Report given to [place] at [facility]

## 2018-06-09 ENCOUNTER — Emergency Department (HOSPITAL_COMMUNITY)
Admission: EM | Admit: 2018-06-09 | Discharge: 2018-06-09 | Disposition: A | Discharge status: Home / Self Care | Payer: Medicare Other | Attending: Emergency Medicine | Admitting: Emergency Medicine

## 2018-06-09 ENCOUNTER — Encounter (HOSPITAL_COMMUNITY): Payer: Self-pay

## 2018-06-09 ENCOUNTER — Emergency Department (HOSPITAL_COMMUNITY): Payer: Medicare Other

## 2018-06-09 ENCOUNTER — Other Ambulatory Visit: Payer: Self-pay

## 2018-06-09 DIAGNOSIS — I110 Hypertensive heart disease with heart failure: Secondary | ICD-10-CM | POA: Diagnosis not present

## 2018-06-09 DIAGNOSIS — E119 Type 2 diabetes mellitus without complications: Secondary | ICD-10-CM | POA: Insufficient documentation

## 2018-06-09 DIAGNOSIS — Z79899 Other long term (current) drug therapy: Secondary | ICD-10-CM | POA: Insufficient documentation

## 2018-06-09 DIAGNOSIS — J449 Chronic obstructive pulmonary disease, unspecified: Secondary | ICD-10-CM | POA: Diagnosis not present

## 2018-06-09 DIAGNOSIS — J209 Acute bronchitis, unspecified: Secondary | ICD-10-CM | POA: Diagnosis not present

## 2018-06-09 DIAGNOSIS — E871 Hypo-osmolality and hyponatremia: Secondary | ICD-10-CM | POA: Diagnosis not present

## 2018-06-09 DIAGNOSIS — I509 Heart failure, unspecified: Secondary | ICD-10-CM | POA: Diagnosis not present

## 2018-06-09 DIAGNOSIS — F17210 Nicotine dependence, cigarettes, uncomplicated: Secondary | ICD-10-CM | POA: Insufficient documentation

## 2018-06-09 DIAGNOSIS — J40 Bronchitis, not specified as acute or chronic: Secondary | ICD-10-CM

## 2018-06-09 DIAGNOSIS — R079 Chest pain, unspecified: Secondary | ICD-10-CM | POA: Diagnosis present

## 2018-06-09 LAB — BASIC METABOLIC PANEL
Anion gap: 11 (ref 5–15)
BUN: 11 mg/dL (ref 6–20)
CO2: 27 mmol/L (ref 22–32)
CREATININE: 0.71 mg/dL (ref 0.44–1.00)
Calcium: 8.4 mg/dL — ABNORMAL LOW (ref 8.9–10.3)
Chloride: 88 mmol/L — ABNORMAL LOW (ref 98–111)
GFR calc Af Amer: >60 mL/min (ref >60)
GFR calc non Af Amer: >60 mL/min (ref >60)
Glucose, Bld: 104 mg/dL — ABNORMAL HIGH (ref 70–99)
Potassium: 4.1 mmol/L (ref 3.5–5.1)
Sodium: 126 mmol/L — ABNORMAL LOW (ref 135–145)

## 2018-06-09 LAB — I-STAT TROPONIN, ED
Troponin i, poc: 0.02 ng/mL (ref 0.00–0.08)
Troponin i, poc: 0 ng/mL (ref 0.00–0.08)

## 2018-06-09 LAB — CBC
HCT: 35.7 % — ABNORMAL LOW (ref 36.0–46.0)
Hemoglobin: 11.6 g/dL — ABNORMAL LOW (ref 12.0–15.0)
MCH: 29.6 pg (ref 26.0–34.0)
MCHC: 32.5 g/dL (ref 30.0–36.0)
MCV: 91.1 fL (ref 80.0–100.0)
Platelets: 210 10*3/uL (ref 150–400)
RBC: 3.92 MIL/uL (ref 3.87–5.11)
RDW: 13 % (ref 11.5–15.5)
WBC: 8.7 10*3/uL (ref 4.0–10.5)
nRBC: 0 % (ref 0.0–0.2)

## 2018-06-09 LAB — BRAIN NATRIURETIC PEPTIDE: B Natriuretic Peptide: 64.7 pg/mL (ref 0.0–100.0)

## 2018-06-09 LAB — D-DIMER, QUANTITATIVE: D-Dimer, Quant: 0.55 ug/mL-FEU — ABNORMAL HIGH (ref 0.00–0.50)

## 2018-06-09 MED ORDER — ACETAMINOPHEN 325 MG PO TABS
650.0000 mg | ORAL_TABLET | Freq: Once | ORAL | Status: AC
  Administered 2018-06-09: 650 mg via ORAL
  Filled 2018-06-09: qty 2

## 2018-06-09 NOTE — ED Notes (Signed)
[facility] called for transport, 3 on list

## 2018-06-09 NOTE — ED Notes (Signed)
Pt continuously stating and yelling "Earlie Server are doing this to me on purpose". Pt was informed that we are trying to find her a room.

## 2018-06-09 NOTE — ED Provider Notes (Signed)
[facility] EMERGENCY DEPARTMENT Provider Note   CSN: [id number] Arrival date & time: 06/09/18  1514     History   Chief Complaint Chief Complaint  Patient presents with  . Chest Pain    HPI Natalie Thomas is a 57 y.o. female.  HPI Planes of left anterior chest pain covering a 1 cm area at left peri-sternal area onset this morning.  Comes and goes.  This particular episode started 3 hours ago.  Pain is pleuritic.  Associated by cough productive of clear sputum.  She admits to mild shortness of breath.  No treatment prior to coming here.  She also reports bilateral arm pain which is worse with raising her arms above her head.  No other associated symptoms Past Medical History:  Diagnosis Date  . Acute on chronic respiratory failure with hypoxia and hypercapnia ([place]) 10/07/2017  . Anemia 10/16/2017  . Anxiety and depression   . CHF (congestive heart failure) ([place])   . Chronic hyponatremia 04/08/2007   Qualifier: Diagnosis of  By: Marca Ancona RMA, Lucy    . Chronic respiratory failure with hypoxia ([place]) 10/07/2017  . Closed comminuted intertrochanteric fracture of right femur ([place]) 11/07/2017  . COPD 04/08/2007   Qualifier: Diagnosis of  By: Marca Ancona RMA, Lucy    . Cough 05/08/2011  . Depression   . Diabetes mellitus, type 2 ([place])    pt denies but states that she has been treated for DM  . Dyspnea 10/16/2017  . Dysuria 04/08/2007   Qualifier: Diagnosis of  By: Reatha Armour, Lucy    . Essential hypertension 04/08/2007   Qualifier: Diagnosis of  By: Marca Ancona RMA, Lucy    . GERD (gastroesophageal reflux disease)   . HIP PAIN, LEFT 12/12/2007   Qualifier: Diagnosis of  By: Loanne Drilling MD, Jacelyn Pi   . Hyperlipidemia 10/07/2017  . Hypertension   . Hyponatremia 07/27/2011  . Incontinence of urine 08/14/2011  . Lumbar disc disease 04/09/2011  . MENOPAUSE, PREMATURE 04/08/2007   Qualifier: Diagnosis of  By: Marca Ancona RMA, Lucy    . NEOPLASM, MALIGNANT, VULVA 04/08/2007   Qualifier:  Diagnosis of  By: Marca Ancona RMA, Lucy    . Osteoporosis   . Osteoporosis 04/08/2007   Qualifier: Diagnosis of  By: Reatha Armour, Lucy    . Polycythemia secondary to smoking 04/09/2011  . S/P right hip fracture 11/07/2017  . Schizoaffective disorder, bipolar type ([place]) 07/05/2011    Patient Active Problem List   Diagnosis Date Noted  . S/P right hip fracture 11/07/2017  . Closed comminuted intertrochanteric fracture of right femur ([place]) 11/07/2017  . Anxiety and depression   . Dyspnea 10/16/2017  . Anemia 10/16/2017  . COPD exacerbation ([place]) 10/07/2017  . Hyperlipidemia 10/07/2017  . Chronic respiratory failure with hypoxia ([place]) 10/07/2017  . Acute on chronic respiratory failure with hypoxia and hypercapnia ([place]) 10/07/2017  . Incontinence of urine 08/14/2011  . Hyponatremia 07/27/2011  . Schizoaffective disorder, bipolar type ([place]) 07/05/2011  . Cough 05/08/2011  . Encounter for long-term (current) use of other medications 04/09/2011  . Lumbar disc disease 04/09/2011  . Polycythemia secondary to smoking 04/09/2011  . HIP PAIN, LEFT 12/12/2007  . 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 DIABETES MELLITUS, TYPE II 04/08/2007  . MENOPAUSE, PREMATURE 04/08/2007  . Chronic hyponatremia 04/08/2007  . Schizoaffective disorder ([place]) 04/08/2007  . DEPRESSION 04/08/2007  . Essential hypertension 04/08/2007  . COPD 04/08/2007  . GERD 04/08/2007  . Osteoporosis 04/08/2007  . DYSURIA 04/08/2007    Past  Surgical History:  Procedure Laterality Date  . EYE SURGERY     on left  eye, pt states that she sees bad out of the right eye and needs surgery there  . INTRAMEDULLARY (IM) NAIL INTERTROCHANTERIC Right 11/13/2017   Procedure: INTRAMEDULLARY (IM) NAIL INTERTROCHANTRIC;  Surgeon: Shona Needles, MD;  Location: [facility];  Service: Orthopedics;  Laterality: Right;  . PELVIC FRACTURE SURGERY       OB History   No obstetric history on file.      Home Medications    Prior to Admission  medications   Medication Sig Start Date End Date Taking? Authorizing Provider  acetaminophen (TYLENOL) 325 MG tablet Take 650 mg by mouth 2 (two) times daily as needed for mild pain.     [provider]  albuterol (PROVENTIL HFA;VENTOLIN HFA) 108 (90 Base) MCG/ACT inhaler Inhale 2 puffs into the lungs every 4 (four) hours as needed for wheezing or shortness of breath. 07/05/17   Ward, Delice Bison, DO  ALPRAZolam Duanne Moron) 0.5 MG tablet Take 1 tablet (0.5 mg total) by mouth 3 (three) times daily. 11/18/17   Georgette Shell, MD  aspirin 81 MG tablet Take 1 tablet (81 mg total) by mouth daily. For heart health 08/06/11   Greig Castilla, FNP  benzonatate (TESSALON) 100 MG capsule Take 100 mg by mouth 3 (three) times daily.    [provider]  buPROPion (WELLBUTRIN XL) 150 MG 24 hr tablet Take 150 mg by mouth daily.    [provider]  buPROPion (WELLBUTRIN XL) 300 MG 24 hr tablet Take 1 tablet (300 mg total) by mouth every morning. Patient not taking: Reported on 04/17/2018 10/19/17 10/19/18  Aline August, MD  carvedilol (COREG) 25 MG tablet Take 25 mg by mouth 2 (two) times daily with a meal.    [provider]  cloNIDine (CATAPRES - DOSED IN MG/24 HR) 0.3 mg/24hr patch Place 0.3 mg onto the skin once a week. On Monday    [provider]  dicyclomine (BENTYL) 20 MG tablet Take 1 tablet (20 mg total) by mouth 2 (two) times daily. Patient taking differently: Take 20 mg by mouth 2 (two) times daily as needed for spasms.  11/04/17   Recardo Evangelist, PA-C  divalproex (DEPAKOTE) 250 MG DR tablet Take 1 tablet (250 mg total) by mouth every 12 (twelve) hours. Patient taking differently: Take 250 mg by mouth 3 (three) times daily.  08/12/17   Nita Sells, MD  docusate sodium (COLACE) 100 MG capsule Take 100 mg by mouth 2 (two) times daily as needed for mild constipation.    [provider]  enoxaparin (LOVENOX) 40 MG/0.4ML injection Inject 0.4 mLs  (40 mg total) into the skin daily. Patient not taking: Reported on 12/20/2017 11/18/17   Georgette Shell, MD  ferrous sulfate 325 (65 FE) MG tablet Take 325 mg by mouth daily with breakfast.    [provider]  fluticasone (FLONASE) 50 MCG/ACT nasal spray Place 2 sprays into both nostrils 2 (two) times daily.    [provider]  fluticasone furoate-vilanterol (BREO ELLIPTA) 100-25 MCG/INH AEPB Inhale 1 puff into the lungs daily.    [provider]  furosemide (LASIX) 20 MG tablet Take 20 mg by mouth 2 (two) times daily.    [provider]  gabapentin (NEURONTIN) 100 MG capsule Take 100 mg by mouth 2 (two) times daily.     [provider]  guaiFENesin (MUCINEX) 600 MG 12 hr tablet  Take 600 mg by mouth 2 (two) times daily.    [provider]  hydrALAZINE (APRESOLINE) 100 MG tablet Take 100 mg by mouth 3 (three) times daily.    [provider]  hydrOXYzine (ATARAX/VISTARIL) 25 MG tablet Take 1 tablet (25 mg total) by mouth 3 (three) times daily as needed for anxiety. Patient taking differently: Take 25 mg by mouth 3 (three) times daily.  10/19/17   Aline August, MD  ipratropium-albuterol (DUONEB) 0.5-2.5 (3) MG/3ML SOLN Take 3 mLs by nebulization See admin instructions. Use three times daily and every 8 hours as needed for SOB    [provider]  levofloxacin (LEVAQUIN) 500 MG tablet Take 1 tablet (500 mg total) by mouth daily. Patient not taking: Reported on 05/29/2018 05/15/18   Jola Schmidt, MD  lurasidone (LATUDA) 80 MG TABS tablet Take 1 tablet (80 mg total) by mouth daily with breakfast. Patient taking differently: Take 80 mg by mouth 2 (two) times daily.  08/12/17   Nita Sells, MD  magnesium oxide (MAG-OX) 400 (241.3 Mg) MG tablet Take 2 tablets (800 mg total) by mouth 2 (two) times daily. Patient taking differently: Take 800 mg by mouth daily.  08/12/17   Nita Sells, MD  Melatonin 10 MG TABS Take 10  mg by mouth at bedtime.    [provider]  NIFEdipine (PROCARDIA XL/ADALAT-CC) 90 MG 24 hr tablet Take 90 mg by mouth daily.    [provider]  nystatin cream (MYCOSTATIN) Apply 1 application topically daily.    [provider]  omeprazole (PRILOSEC) 20 MG capsule Take 1 capsule (20 mg total) by mouth 2 (two) times daily before a meal. For acid reflux. Patient taking differently: Take 20 mg by mouth daily. For acid reflux. 10/19/17   Aline August, MD  oseltamivir (TAMIFLU) 75 MG capsule Take 75 mg by mouth 2 (two) times daily.    [provider]  oxyCODONE (OXY IR/ROXICODONE) 5 MG immediate release tablet Take 5 mg by mouth every 6 (six) hours as needed for severe pain.    [provider]  Loma [place] (OYSTER CALCIUM) 500 MG TABS tablet Take 500 mg of elemental calcium by mouth daily.    [provider]  PARoxetine (PAXIL) 20 MG tablet Take 1 tablet (20 mg total) by mouth daily. Patient not taking: Reported on 04/17/2018 10/19/17   Aline August, MD  PARoxetine (PAXIL) 30 MG tablet Take 30 mg by mouth daily.    [provider]  polyethylene glycol (MIRALAX) packet Take 17 g by mouth daily as needed. Patient taking differently: Take 17 g by mouth daily.  10/19/17   Aline August, MD  pravastatin (PRAVACHOL) 20 MG tablet Take 20 mg by mouth daily.    [provider]  predniSONE (DELTASONE) 10 MG tablet Take q day 6,5,4,3,2,1 05/30/18   Daleen Bo, MD  Saccharomyces boulardii (PROBIOTIC) 250 MG CAPS Take 1 capsule by mouth daily.    [provider]  sodium chloride 1 g tablet Take 1 g by mouth 3 (three) times daily.    [provider]  traZODone (DESYREL) 100 MG tablet Take 100 mg by mouth at bedtime.    [provider]    Family History No family history on file.  Social History Social History   Tobacco Use  . Smoking status: Current Every Day Smoker    Packs/day: 0.25    Types:  Cigarettes  . Smokeless tobacco: Never Used  Substance Use Topics  . Alcohol  use: No  . Drug use: No     Allergies   Clarithromycin and Penicillins   Review of Systems Review of Systems  Respiratory: Positive for cough and shortness of breath.   Musculoskeletal: Positive for myalgias.       BiLateral arm pain  Allergic/Immunologic: Positive for immunocompromised state.       Diabetic  All other systems reviewed and are negative.    Physical Exam Updated Vital Signs BP (!) 158/59 (BP Location: Right Arm)   Pulse (!) 59   Temp 98.1 F (36.7 C) (Oral)   Resp (!) 22   SpO2 95%   Physical Exam Vitals signs and nursing note reviewed.  Constitutional:      Appearance: She is well-developed.  HENT:     Head: Normocephalic and atraumatic.  Eyes:     Conjunctiva/sclera: Conjunctivae normal.     Pupils: Pupils are equal, round, and reactive to light.  Neck:     Musculoskeletal: Neck supple.     Thyroid: No thyromegaly.     Trachea: No tracheal deviation.  Cardiovascular:     Rate and Rhythm: Normal rate and regular rhythm.     Pulses: Normal pulses.     Heart sounds: Normal heart sounds. No murmur. No friction rub.  Pulmonary:     Effort: Pulmonary effort is normal.     Breath sounds: Normal breath sounds.     Comments: Chest tender at left parasternal area reproducing pain exactly Chest:     Chest wall: Tenderness present.  Abdominal:     General: Bowel sounds are normal. There is no distension.     Palpations: Abdomen is soft.     Tenderness: There is no abdominal tenderness.     Comments: Obese  Musculoskeletal: Normal range of motion.        General: No swelling or tenderness.     Comments: Arm pain is reproducible by forcible abduction of both shoulders radial pulses 2+ bilaterally  Skin:    General: Skin is warm and dry.     Findings: No rash.  Neurological:     Mental Status: She is alert.     Coordination: Coordination normal.      ED Treatments /  Results  Labs (all labs ordered are listed, but only abnormal results are displayed) Labs Reviewed  BASIC METABOLIC PANEL - Abnormal; Notable for the following components:      Result Value   Sodium 126 (*)    Chloride 88 (*)    Glucose, Bld 104 (*)    Calcium 8.4 (*)    All other components within normal limits  CBC - Abnormal; Notable for the following components:   Hemoglobin 11.6 (*)    HCT 35.7 (*)    All other components within normal limits  I-STAT TROPONIN, ED    EKG EKG Interpretation  Date/Time:  Monday June 09 2018 15:24:11 EST Ventricular Rate:  65 PR Interval:  190 QRS Duration: 84 QT Interval:  436 QTC Calculation: 453 R Axis:   72 Text Interpretation:  Normal sinus rhythm Septal infarct , age undetermined Abnormal ECG No significant change since last tracing Confirmed by Orlie Dakin [phone]) on 06/09/2018 6:28:00 PM  Results for orders placed or performed during the hospital encounter of 60/45/40  Basic metabolic panel  Result Value Ref Range   Sodium 126 (L) 135 - 145 mmol/L   Potassium 4.1 3.5 - 5.1 mmol/L   Chloride 88 (L) 98 - 111 mmol/L   CO2  27 22 - 32 mmol/L   Glucose, Bld 104 (H) 70 - 99 mg/dL   BUN 11 6 - 20 mg/dL   Creatinine, Ser 0.71 0.44 - 1.00 mg/dL   Calcium 8.4 (L) 8.9 - 10.3 mg/dL   GFR calc non Af Amer >60 >60 mL/min   GFR calc Af Amer >60 >60 mL/min   Anion gap 11 5 - 15  CBC  Result Value Ref Range   WBC 8.7 4.0 - 10.5 K/uL   RBC 3.92 3.87 - 5.11 MIL/uL   Hemoglobin 11.6 (L) 12.0 - 15.0 g/dL   HCT 35.7 (L) 36.0 - 46.0 %   MCV 91.1 80.0 - 100.0 fL   MCH 29.6 26.0 - 34.0 pg   MCHC 32.5 30.0 - 36.0 g/dL   RDW 13.0 11.5 - 15.5 %   Platelets 210 150 - 400 K/uL   nRBC 0.0 0.0 - 0.2 %  Brain natriuretic peptide  Result Value Ref Range   B Natriuretic Peptide 64.7 0.0 - 100.0 pg/mL  D-dimer, quantitative (not at [facility])  Result Value Ref Range   D-Dimer, Quant 0.55 (H) 0.00 - 0.50 ug/mL-FEU  I-stat troponin, ED  Result  Value Ref Range   Troponin i, poc 0.02 0.00 - 0.08 ng/mL   Comment 3          I-stat troponin, ED  Result Value Ref Range   Troponin i, poc 0.00 0.00 - 0.08 ng/mL   Comment 3           Dg Chest 2 View  Result Date: 06/09/2018 CLINICAL DATA:  57 year old female with a history of chest pain and shortness of breath EXAM: CHEST - 2 VIEW COMPARISON:  05/29/2018, 05/14/2018, CT chest 05/14/2018 FINDINGS: Cardiomediastinal silhouette unchanged in size and contour. Calcifications of the aortic arch. Similar appearance of interstitial opacities, with opacity at the posterior lung base on the lateral view with meniscus configuration. No interlobular septal thickening. No new confluent airspace disease. Mild fullness in the central vasculature with peribronchial vasculature thickening. No displaced fracture IMPRESSION: Evidence of mild edema and small pleural effusions. Aortic atherosclerosis Electronically Signed   By: Corrie Mckusick D.O.   On: 06/09/2018 16:39   Dg Chest 2 View  Result Date: 05/29/2018 CLINICAL DATA:  Cough and shortness of breath. EXAM: CHEST - 2 VIEW COMPARISON:  05/14/2018 FINDINGS: Blunting at the left costophrenic angle and suggestive for small left pleural effusion. Cannot exclude a small right pleural effusion. Prominent lung markings appear chronic. Heart size is upper limits of normal and stable. Atherosclerotic calcifications at the aortic arch. Again noted are multiple compression deformities throughout the thoracic spine. Old right eighth rib fracture. IMPRESSION: Left basilar densities are suggestive for a small left pleural effusion and atelectasis. Cannot exclude a small right pleural effusion. Electronically Signed   By: Markus Daft M.D.   On: 05/29/2018 21:35   Ct Angio Chest Pe W/cm &/or Wo Cm  Result Date: 05/14/2018 CLINICAL DATA:  Chest pain.  Shortness of breath. EXAM: CT ANGIOGRAPHY CHEST WITH CONTRAST TECHNIQUE: Multidetector CT imaging of the chest was performed  using the standard protocol during bolus administration of intravenous contrast. Multiplanar CT image reconstructions and MIPs were obtained to evaluate the vascular anatomy. CONTRAST:  159mL ISOVUE-370 IOPAMIDOL (ISOVUE-370) INJECTION 76% COMPARISON:  Chest x-ray May 14, 2018.  Chest CT December 24, 2017. FINDINGS: Cardiovascular: Stable cardiomegaly. Coronary artery calcifications noted. Atherosclerotic changes are seen in the thoracic aorta without aneurysm or dissection. No pulmonary emboli. Mediastinum/Nodes:  The thyroid and esophagus are normal. Trace pleural effusions. Small stable pericardial effusion. A prominent prevascular node on series 6, image 42 measures 10 mm in short axis, not significantly changed and probably reactive. Other shotty nodes are stable and likely reactive as well. Lungs/Pleura: Central airways are normal. Bronchial wall thickening in the bases. Ground-glass opacity anteriorly in the left upper lobe on series 7, image 51. Ground-glass dependently in the upper lobes adjacent to the major fissures. Other regions of scattered subsegmental atelectasis in the bases. No nodule, mass, or other infiltrate. Upper Abdomen: No acute abnormality. Musculoskeletal: Multilevel compression fractures in the thoracic spine, unchanged. No acute fractures noted. Review of the MIP images confirms the above findings. IMPRESSION: 1. No pulmonary emboli identified. 2. Infiltrate in the left upper lobe, worrisome for pneumonia. Recommend follow-up to resolution. 3. Coronary artery calcifications. Atherosclerotic changes in the thoracic aorta. 4. Reactive nodes in the mediastinum. 5. Chronic multilevel compression fractures in the thoracic spine. Aortic Atherosclerosis (ICD10-I70.0). Electronically Signed   By: Dorise Bullion III M.D   On: 05/14/2018 22:47   Dg Chest Port 1 View  Result Date: 05/14/2018 CLINICAL DATA:  Chest pain for 3 days with right arm numbness in shortness of breath. EXAM: PORTABLE  CHEST 1 VIEW COMPARISON:  04/17/2018 FINDINGS: Mild-to-moderate enlargement of the cardiopericardial silhouette. Atherosclerotic calcification of the aortic arch. Indistinct airspace opacity in the right mid lung and right lung base. Bilateral interstitial accentuation favoring the lung bases. Mild airway thickening centrally. IMPRESSION: 1. Airspace opacity in the right mid lung and right lung base, suspicious for pneumonia but conceivably from asymmetric or resolving edema. 2. Mild-to-moderate enlargement of the cardiopericardial silhouette. 3. Airway thickening is present, suggesting bronchitis or reactive airways disease. 4. Atherosclerotic aortic arch. Electronically Signed   By: Van Clines M.D.   On: 05/14/2018 19:53   Radiology Dg Chest 2 View  Result Date: 06/09/2018 CLINICAL DATA:  57 year old female with a history of chest pain and shortness of breath EXAM: CHEST - 2 VIEW COMPARISON:  05/29/2018, 05/14/2018, CT chest 05/14/2018 FINDINGS: Cardiomediastinal silhouette unchanged in size and contour. Calcifications of the aortic arch. Similar appearance of interstitial opacities, with opacity at the posterior lung base on the lateral view with meniscus configuration. No interlobular septal thickening. No new confluent airspace disease. Mild fullness in the central vasculature with peribronchial vasculature thickening. No displaced fracture IMPRESSION: Evidence of mild edema and small pleural effusions. Aortic atherosclerosis Electronically Signed   By: Corrie Mckusick D.O.   On: 06/09/2018 16:39    Procedures Procedures (including critical care time)  Medications Ordered in ED Medications - No data to display X-ray viewed by me At 9:20 PM she states her pain is not improved however she is sleeping soundly and easily arousable to gentle tactile stimulus. Initial Impression / Assessment and Plan / ED Course  I have reviewed the triage vital signs and the nursing notes.  Pertinent labs &  imaging results that were available during my care of the patient were reviewed by me and considered in my medical decision making (see chart for details).     Acute coronary syndrome.  Heart score equals 3.  Highly atypical story.  Nonacute EKG.  Negative troponins x2.  Low pretest clinical suspicion for pulmonary embolism.  Age-adjusted d-dimer is negative.  BNP is not consistent with congestive heart failure.  Symptoms consistent with bronchitis.   Lab work consistent with hyponatremia which is chronic.  Chest pain is consistent with musculoskeletal pain.  Cough  is consistent with bronchitis.  Plan Tylenol for pain.  I counseled patient for 5 minutes on smoking cessation Final Clinical Impressions(s) / ED Diagnoses  Diagnosis #1 chest wall pain #2 bronchitis #3 hyponatremia #4 tobacco abuse Final diagnoses:  None    ED Discharge Orders    None       Orlie Dakin, MD 06/09/18 2135

## 2018-06-09 NOTE — Discharge Instructions (Addendum)
Use your pro-air inhaler 2 puffs every 4 hours as needed for cough or shortness of breath.  Take Tylenol as directed for pain.  Call Dr.Curl if not improving in a week.  Ask Dr. Marjory Lies to help you to stop smoking

## 2018-06-09 NOTE — ED Notes (Signed)
Pt. Stated, I came in on an ambulance and I should not have to sit out here. Explained to pt. The procedure for waiting.

## 2018-06-09 NOTE — ED Notes (Signed)
Pt in lobby yelling. Pt has been informed multiple times that we are trying to find her a room.

## 2018-06-09 NOTE — ED Notes (Signed)
Pt in ED lobby waiting for room/bed. Pt made call out several times making statements like "I need a room" and "I don't know why I'm still waiting, I came by EMS, my heart is hurting." Explained to pt numerous times bed will be assigned once available regardless of arrival method. Offered to recheck vitals and once again assured pt room will be given once available. Pt agreed to have vitals rechecked. Attempted to measure BP by rolling up rt sleeve. Pt hollered loudly "that hurts, stop you're hurting me". Attempt to recheck vitals were immediately discontinued and request made to [facility] RN to complete vitals assessment. ENM

## 2018-06-09 NOTE — ED Triage Notes (Signed)
Pt here with ems from [facility] for chest pain that started this morning. Pt also c.o bilateral arm pain for the past 3 days. Pt was dx with pneumonia 2 weeks ago. Ems reports wheezing, pt was given 5 albuterol and 0.5 atrovent. Hx of COPD on 2L [place] always. 18G L hand. Pt a.o. took ASA prior to EMS arrival.

## 2018-06-09 NOTE — ED Notes (Signed)
ED Provider at bedside. 

## 2018-06-17 ENCOUNTER — Encounter (HOSPITAL_COMMUNITY): Payer: Self-pay

## 2018-06-17 ENCOUNTER — Other Ambulatory Visit: Payer: Self-pay

## 2018-06-17 ENCOUNTER — Emergency Department (HOSPITAL_COMMUNITY): Payer: Medicare Other

## 2018-06-17 ENCOUNTER — Emergency Department (HOSPITAL_COMMUNITY)
Admission: EM | Admit: 2018-06-17 | Discharge: 2018-06-18 | Disposition: A | Discharge status: Skilled Nursing Facility | Payer: Medicare Other | Attending: Emergency Medicine | Admitting: Emergency Medicine

## 2018-06-17 DIAGNOSIS — J449 Chronic obstructive pulmonary disease, unspecified: Secondary | ICD-10-CM | POA: Insufficient documentation

## 2018-06-17 DIAGNOSIS — M791 Myalgia, unspecified site: Secondary | ICD-10-CM | POA: Insufficient documentation

## 2018-06-17 DIAGNOSIS — Z8544 Personal history of malignant neoplasm of other female genital organs: Secondary | ICD-10-CM | POA: Insufficient documentation

## 2018-06-17 DIAGNOSIS — M79605 Pain in left leg: Secondary | ICD-10-CM | POA: Diagnosis not present

## 2018-06-17 DIAGNOSIS — R609 Edema, unspecified: Secondary | ICD-10-CM | POA: Diagnosis not present

## 2018-06-17 DIAGNOSIS — E119 Type 2 diabetes mellitus without complications: Secondary | ICD-10-CM | POA: Diagnosis not present

## 2018-06-17 DIAGNOSIS — R05 Cough: Secondary | ICD-10-CM | POA: Diagnosis not present

## 2018-06-17 DIAGNOSIS — F17210 Nicotine dependence, cigarettes, uncomplicated: Secondary | ICD-10-CM | POA: Insufficient documentation

## 2018-06-17 DIAGNOSIS — R6883 Chills (without fever): Secondary | ICD-10-CM | POA: Diagnosis not present

## 2018-06-17 DIAGNOSIS — Z794 Long term (current) use of insulin: Secondary | ICD-10-CM | POA: Diagnosis not present

## 2018-06-17 DIAGNOSIS — Z79899 Other long term (current) drug therapy: Secondary | ICD-10-CM | POA: Insufficient documentation

## 2018-06-17 DIAGNOSIS — K439 Ventral hernia without obstruction or gangrene: Secondary | ICD-10-CM | POA: Diagnosis not present

## 2018-06-17 DIAGNOSIS — Z7901 Long term (current) use of anticoagulants: Secondary | ICD-10-CM | POA: Insufficient documentation

## 2018-06-17 DIAGNOSIS — I110 Hypertensive heart disease with heart failure: Secondary | ICD-10-CM | POA: Insufficient documentation

## 2018-06-17 DIAGNOSIS — I509 Heart failure, unspecified: Secondary | ICD-10-CM | POA: Insufficient documentation

## 2018-06-17 DIAGNOSIS — M79604 Pain in right leg: Secondary | ICD-10-CM | POA: Insufficient documentation

## 2018-06-17 DIAGNOSIS — K5900 Constipation, unspecified: Secondary | ICD-10-CM | POA: Diagnosis present

## 2018-06-17 LAB — CBC WITH DIFFERENTIAL/PLATELET
Abs Immature Granulocytes: 0.02 10*3/uL (ref 0.00–0.07)
Basophils Absolute: 0 10*3/uL (ref 0.0–0.1)
Basophils Relative: 0 %
Eosinophils Absolute: 0.1 10*3/uL (ref 0.0–0.5)
Eosinophils Relative: 1 %
HCT: 34.7 % — ABNORMAL LOW (ref 36.0–46.0)
HEMOGLOBIN: 11.2 g/dL — AB (ref 12.0–15.0)
Immature Granulocytes: 0 %
LYMPHS PCT: 27 %
Lymphs Abs: 1.4 10*3/uL (ref 0.7–4.0)
MCH: 29.8 pg (ref 26.0–34.0)
MCHC: 32.3 g/dL (ref 30.0–36.0)
MCV: 92.3 fL (ref 80.0–100.0)
Monocytes Absolute: 0.6 10*3/uL (ref 0.1–1.0)
Monocytes Relative: 12 %
Neutro Abs: 3.1 10*3/uL (ref 1.7–7.7)
Neutrophils Relative %: 60 %
Platelets: 146 10*3/uL — ABNORMAL LOW (ref 150–400)
RBC: 3.76 MIL/uL — ABNORMAL LOW (ref 3.87–5.11)
RDW: 13.1 % (ref 11.5–15.5)
WBC: 5.1 10*3/uL (ref 4.0–10.5)
nRBC: 0 % (ref 0.0–0.2)

## 2018-06-17 LAB — COMPREHENSIVE METABOLIC PANEL
ALBUMIN: 3.4 g/dL — AB (ref 3.5–5.0)
ALT: 10 U/L (ref 0–44)
AST: 11 U/L — ABNORMAL LOW (ref 15–41)
Alkaline Phosphatase: 61 U/L (ref 38–126)
Anion gap: 8 (ref 5–15)
BUN: 11 mg/dL (ref 6–20)
CO2: 30 mmol/L (ref 22–32)
Calcium: 8.7 mg/dL — ABNORMAL LOW (ref 8.9–10.3)
Chloride: 91 mmol/L — ABNORMAL LOW (ref 98–111)
Creatinine, Ser: 0.55 mg/dL (ref 0.44–1.00)
GFR calc Af Amer: >60 mL/min (ref >60)
GFR calc non Af Amer: >60 mL/min (ref >60)
Glucose, Bld: 89 mg/dL (ref 70–99)
Potassium: 4 mmol/L (ref 3.5–5.1)
Sodium: 129 mmol/L — ABNORMAL LOW (ref 135–145)
Total Bilirubin: 0.5 mg/dL (ref 0.3–1.2)
Total Protein: 6.6 g/dL (ref 6.5–8.1)

## 2018-06-17 LAB — I-STAT TROPONIN, ED: Troponin i, poc: 0 ng/mL (ref 0.00–0.08)

## 2018-06-17 LAB — LIPASE, BLOOD: Lipase: 38 U/L (ref 11–51)

## 2018-06-17 LAB — INFLUENZA PANEL BY PCR (TYPE A & B)
Influenza A By PCR: NEGATIVE
Influenza B By PCR: NEGATIVE

## 2018-06-17 LAB — BRAIN NATRIURETIC PEPTIDE: B Natriuretic Peptide: 125.4 pg/mL — ABNORMAL HIGH (ref 0.0–100.0)

## 2018-06-17 LAB — VALPROIC ACID LEVEL: Valproic Acid Lvl: 37 ug/mL — ABNORMAL LOW (ref 50.0–100.0)

## 2018-06-17 MED ORDER — ALPRAZOLAM 0.5 MG PO TABS
0.5000 mg | ORAL_TABLET | Freq: Three times a day (TID) | ORAL | Status: DC
  Administered 2018-06-18: 0.5 mg via ORAL
  Filled 2018-06-17: qty 1

## 2018-06-17 MED ORDER — FUROSEMIDE 40 MG PO TABS
20.0000 mg | ORAL_TABLET | Freq: Once | ORAL | Status: AC
  Administered 2018-06-18: 20 mg via ORAL
  Filled 2018-06-17: qty 1

## 2018-06-17 MED ORDER — OXYCODONE-ACETAMINOPHEN 5-325 MG PO TABS
1.0000 | ORAL_TABLET | Freq: Once | ORAL | Status: AC
  Administered 2018-06-17: 1 via ORAL
  Filled 2018-06-17: qty 1

## 2018-06-17 MED ORDER — MELATONIN 5 MG PO TABS
10.0000 mg | ORAL_TABLET | Freq: Every day | ORAL | Status: DC
  Administered 2018-06-18: 10 mg via ORAL
  Filled 2018-06-17: qty 2

## 2018-06-17 MED ORDER — DIVALPROEX SODIUM 250 MG PO DR TAB
250.0000 mg | DELAYED_RELEASE_TABLET | Freq: Two times a day (BID) | ORAL | Status: DC
  Administered 2018-06-18: 250 mg via ORAL
  Filled 2018-06-17: qty 1

## 2018-06-17 MED ORDER — IPRATROPIUM-ALBUTEROL 0.5-2.5 (3) MG/3ML IN SOLN
3.0000 mL | Freq: Once | RESPIRATORY_TRACT | Status: AC
  Administered 2018-06-17: 3 mL via RESPIRATORY_TRACT
  Filled 2018-06-17: qty 3

## 2018-06-17 MED ORDER — TRAZODONE HCL 50 MG PO TABS
150.0000 mg | ORAL_TABLET | Freq: Every day | ORAL | Status: DC
  Administered 2018-06-18: 150 mg via ORAL
  Filled 2018-06-17: qty 1

## 2018-06-17 MED ORDER — HYDRALAZINE HCL 50 MG PO TABS
100.0000 mg | ORAL_TABLET | Freq: Three times a day (TID) | ORAL | Status: DC
  Administered 2018-06-18: 100 mg via ORAL
  Filled 2018-06-17: qty 2

## 2018-06-17 MED ORDER — POLYETHYLENE GLYCOL 3350 17 G PO PACK: 17.0000 g | PACK | Freq: Every day | ORAL | 0 refills | Status: DC | PRN

## 2018-06-17 MED ORDER — GABAPENTIN 100 MG PO CAPS
100.0000 mg | ORAL_CAPSULE | Freq: Two times a day (BID) | ORAL | Status: DC
  Administered 2018-06-18: 100 mg via ORAL
  Filled 2018-06-17: qty 1

## 2018-06-17 MED ORDER — CARVEDILOL 25 MG PO TABS
25.0000 mg | ORAL_TABLET | Freq: Two times a day (BID) | ORAL | Status: DC
  Administered 2018-06-18: 25 mg via ORAL
  Filled 2018-06-17: qty 1

## 2018-06-17 MED ORDER — DOCUSATE SODIUM 100 MG PO CAPS
100.0000 mg | ORAL_CAPSULE | Freq: Two times a day (BID) | ORAL | Status: DC | PRN
  Administered 2018-06-18: 100 mg via ORAL
  Filled 2018-06-17: qty 1

## 2018-06-17 NOTE — ED Notes (Signed)
Bed: JF59 Expected date:  Expected time:  Means of arrival:  Comments: EMS 57 yo female from SNF-constipation for 2 weeks-body aches and chills

## 2018-06-17 NOTE — Discharge Instructions (Addendum)
You were evaluated in the emergency department for multiple complaints including constipation, shortness of breath, fluid in your legs.  You should increase your Lasix to 40 mg daily for the next 3 days.  For the constipation we are sending you home with a prescription for MiraLAX.  Will be important for you to contact your doctor when the office is open after the holidays.

## 2018-06-17 NOTE — ED Triage Notes (Signed)
PT BIB EMS FROM [facility] C/O CONSTIPATION, BILATERAL LEG SWELLING, CHILLS, AND BODY ACHES X 2 WEEKS. PAIN 6/10.

## 2018-06-17 NOTE — ED Provider Notes (Addendum)
[facility] DEPT Provider Note   CSN: [id number] Arrival date & time: 06/17/18  2028     History   Chief Complaint No chief complaint on file.   HPI Natalie Thomas is a 57 y.o. female.  She is presenting by ambulance from [facility] for evaluation of multiple complaints.  She says she feels hot and cold.  Her legs have been swelling and paining her more.  She has various body aches.  She says her ventral hernia has been acting up.  She has been coughing more and wonders if she has pneumonia again.  She also does not feel like the nurses on second shift are giving her medications like they are supposed to.  The history is provided by the patient and the EMS personnel.  Leg Pain   This is a recurrent problem. The current episode started more than 1 week ago. The problem occurs constantly. The problem has not changed since onset.The pain is present in the left lower leg, left upper leg, right lower leg and right upper leg. The quality of the pain is described as aching. The pain is moderate. Associated symptoms include limited range of motion and stiffness. The symptoms are aggravated by activity. She has tried nothing for the symptoms. The treatment provided no relief. There has been no history of extremity trauma.    Past Medical History:  Diagnosis Date  . Acute on chronic respiratory failure with hypoxia and hypercapnia ([place]) 10/07/2017  . Anemia 10/16/2017  . Anxiety and depression   . CHF (congestive heart failure) ([place])   . Chronic hyponatremia 04/08/2007   Qualifier: Diagnosis of  By: Marca Ancona RMA, Lucy    . Chronic respiratory failure with hypoxia ([place]) 10/07/2017  . Closed comminuted intertrochanteric fracture of right femur ([place]) 11/07/2017  . COPD 04/08/2007   Qualifier: Diagnosis of  By: Marca Ancona RMA, Lucy    . Cough 05/08/2011  . Depression   . Diabetes mellitus, type 2 ([place])    pt denies but states that she has been treated for DM  .  Dyspnea 10/16/2017  . Dysuria 04/08/2007   Qualifier: Diagnosis of  By: Reatha Armour, Lucy    . Essential hypertension 04/08/2007   Qualifier: Diagnosis of  By: Marca Ancona RMA, Lucy    . GERD (gastroesophageal reflux disease)   . HIP PAIN, LEFT 12/12/2007   Qualifier: Diagnosis of  By: Loanne Drilling MD, Jacelyn Pi   . Hyperlipidemia 10/07/2017  . Hypertension   . Hyponatremia 07/27/2011  . Incontinence of urine 08/14/2011  . Lumbar disc disease 04/09/2011  . MENOPAUSE, PREMATURE 04/08/2007   Qualifier: Diagnosis of  By: Marca Ancona RMA, Lucy    . 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 Osteoporosis   . Osteoporosis 04/08/2007   Qualifier: Diagnosis of  By: Reatha Armour, Lucy    . Polycythemia secondary to smoking 04/09/2011  . S/P right hip fracture 11/07/2017  . Schizoaffective disorder, bipolar type ([place]) 07/05/2011    Patient Active Problem List   Diagnosis Date Noted  . S/P right hip fracture 11/07/2017  . Closed comminuted intertrochanteric fracture of right femur ([place]) 11/07/2017  . Anxiety and depression   . Dyspnea 10/16/2017  . Anemia 10/16/2017  . COPD exacerbation ([place]) 10/07/2017  . Hyperlipidemia 10/07/2017  . Chronic respiratory failure with hypoxia ([place]) 10/07/2017  . Acute on chronic respiratory failure with hypoxia and hypercapnia ([place]) 10/07/2017  . Incontinence of urine  08/14/2011  . Hyponatremia 07/27/2011  . Schizoaffective disorder, bipolar type ([place]) 07/05/2011  . Cough 05/08/2011  . Encounter for long-term (current) use of other medications 04/09/2011  . Lumbar disc disease 04/09/2011  . Polycythemia secondary to smoking 04/09/2011  . HIP PAIN, LEFT 12/12/2007  . 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 DIABETES MELLITUS, TYPE II 04/08/2007  . MENOPAUSE, PREMATURE 04/08/2007  . Chronic hyponatremia 04/08/2007  . Schizoaffective disorder ([place]) 04/08/2007  . DEPRESSION 04/08/2007  . Essential hypertension 04/08/2007  . COPD  04/08/2007  . GERD 04/08/2007  . Osteoporosis 04/08/2007  . DYSURIA 04/08/2007    Past Surgical History:  Procedure Laterality Date  . EYE SURGERY     on left  eye, pt states that she sees bad out of the right eye and needs surgery there  . INTRAMEDULLARY (IM) NAIL INTERTROCHANTERIC Right 11/13/2017   Procedure: INTRAMEDULLARY (IM) NAIL INTERTROCHANTRIC;  Surgeon: Shona Needles, MD;  Location: [facility];  Service: Orthopedics;  Laterality: Right;  . PELVIC FRACTURE SURGERY       OB History   No obstetric history on file.      Home Medications    Prior to Admission medications   Medication Sig Start Date End Date Taking? Authorizing Provider  acetaminophen (TYLENOL) 325 MG tablet Take 650 mg by mouth 2 (two) times daily as needed for mild pain.     [provider]  albuterol (PROVENTIL HFA;VENTOLIN HFA) 108 (90 Base) MCG/ACT inhaler Inhale 2 puffs into the lungs every 4 (four) hours as needed for wheezing or shortness of breath. 07/05/17   Ward, Delice Bison, DO  ALPRAZolam Duanne Moron) 0.5 MG tablet Take 1 tablet (0.5 mg total) by mouth 3 (three) times daily. 11/18/17   Georgette Shell, MD  aluminum-magnesium hydroxide-simethicone (MAALOX) 200-200-20 MG/5ML SUSP Take 30 mLs by mouth 3 (three) times daily as needed (Heartburn).    [provider]  aspirin 81 MG tablet Take 1 tablet (81 mg total) by mouth daily. For heart health 08/06/11   Greig Castilla, FNP  BACILLUS COAGULANS-INULIN PO Take 250 mg by mouth daily.    [provider]  buPROPion (WELLBUTRIN XL) 150 MG 24 hr tablet Take 150 mg by mouth daily.    [provider]  buPROPion (WELLBUTRIN XL) 300 MG 24 hr tablet Take 1 tablet (300 mg total) by mouth every morning. Patient not taking: Reported on 04/17/2018 10/19/17 10/19/18  Aline August, MD  carvedilol (COREG) 25 MG tablet Take 25 mg by mouth 2 (two) times daily with a meal.    [provider]  cloNIDine (CATAPRES - DOSED IN MG/24  HR) 0.3 mg/24hr patch Place 0.3 mg onto the skin once a week. On Monday    [provider]  dicyclomine (BENTYL) 20 MG tablet Take 1 tablet (20 mg total) by mouth 2 (two) times daily. Patient taking differently: Take 20 mg by mouth 2 (two) times daily as needed for spasms.  11/04/17   Recardo Evangelist, PA-C  divalproex (DEPAKOTE) 250 MG DR tablet Take 1 tablet (250 mg total) by mouth every 12 (twelve) hours. Patient taking differently: Take 250 mg by mouth 3 (three) times daily.  08/12/17   Nita Sells, MD  docusate sodium (COLACE) 100 MG capsule Take 100 mg by mouth 2 (two) times daily as needed for mild constipation.    [provider]  enoxaparin (LOVENOX) 40 MG/0.4ML injection Inject 0.4 mLs (40 mg total) into the skin daily. Patient not taking: Reported on  12/20/2017 11/18/17   Georgette Shell, MD  ferrous sulfate 325 (65 FE) MG tablet Take 325 mg by mouth daily with breakfast.    [provider]  fluticasone (FLONASE) 50 MCG/ACT nasal spray Place 2 sprays into both nostrils 2 (two) times daily.    [provider]  fluticasone furoate-vilanterol (BREO ELLIPTA) 100-25 MCG/INH AEPB Inhale 1 puff into the lungs daily.    [provider]  furosemide (LASIX) 20 MG tablet Take 20 mg by mouth 2 (two) times daily.    [provider]  gabapentin (NEURONTIN) 100 MG capsule Take 100 mg by mouth 2 (two) times daily.     [provider]  guaifenesin (ROBITUSSIN) 100 MG/5ML syrup Take 200 mg by mouth 3 (three) times daily as needed for cough.    [provider]  hydrALAZINE (APRESOLINE) 100 MG tablet Take 100 mg by mouth 3 (three) times daily.    [provider]  hydrOXYzine (ATARAX/VISTARIL) 25 MG tablet Take 1 tablet (25 mg total) by mouth 3 (three) times daily as needed for anxiety. Patient taking differently: Take 25 mg by mouth 3 (three) times daily.  10/19/17   Aline August, MD  ipratropium-albuterol (DUONEB)  0.5-2.5 (3) MG/3ML SOLN Take 3 mLs by nebulization See admin instructions. Use three times daily and every 8 hours as needed for SOB    [provider]  levofloxacin (LEVAQUIN) 500 MG tablet Take 1 tablet (500 mg total) by mouth daily. Patient not taking: Reported on 05/29/2018 05/15/18   Jola Schmidt, MD  loperamide (IMODIUM A-D) 2 MG tablet Take 2 mg by mouth 4 (four) times daily as needed for diarrhea or loose stools.    [provider]  lurasidone (LATUDA) 80 MG TABS tablet Take 1 tablet (80 mg total) by mouth daily with breakfast. Patient taking differently: Take 80 mg by mouth 2 (two) times daily.  08/12/17   Nita Sells, MD  magnesium oxide (MAG-OX) 400 (241.3 Mg) MG tablet Take 2 tablets (800 mg total) by mouth 2 (two) times daily. Patient taking differently: Take 400 mg by mouth daily.  08/12/17   Nita Sells, MD  Melatonin 10 MG TABS Take 10 mg by mouth at bedtime.    [provider]  neomycin-bacitracin-polymyxin (NEOSPORIN) ointment Apply 1 application topically daily as needed for wound care.    [provider]  NIFEdipine (PROCARDIA XL/ADALAT-CC) 90 MG 24 hr tablet Take 90 mg by mouth daily.    [provider]  nystatin cream (MYCOSTATIN) Apply 1 application topically daily.    [provider]  omeprazole (PRILOSEC) 20 MG capsule Take 1 capsule (20 mg total) by mouth 2 (two) times daily before a meal. For acid reflux. Patient taking differently: Take 20 mg by mouth daily. For acid reflux. 10/19/17   Aline August, MD  oxyCODONE (OXY IR/ROXICODONE) 5 MG immediate release tablet Take 5 mg by mouth every 6 (six) hours as needed for severe pain.    [provider]  Loma [place] (OYSTER CALCIUM) 500 MG TABS tablet Take 500 mg of elemental calcium by mouth daily with breakfast.     [provider]  PARoxetine (PAXIL) 20 MG tablet Take 1 tablet (20 mg total) by mouth daily. Patient not taking: Reported on  04/17/2018 10/19/17   Aline August, MD  PARoxetine (PAXIL) 30 MG tablet Take 30 mg by mouth daily.    [provider]  polyethylene glycol (MIRALAX) packet Take 17 g by mouth daily as needed. Patient  taking differently: Take 17 g by mouth daily.  10/19/17   Aline August, MD  pravastatin (PRAVACHOL) 20 MG tablet Take 20 mg by mouth daily.    [provider]  predniSONE (DELTASONE) 10 MG tablet Take q day 6,5,4,3,2,1 Patient not taking: Reported on 06/09/2018 05/30/18   Daleen Bo, MD  sodium chloride 1 g tablet Take 1 g by mouth 3 (three) times daily.    [provider]  traZODone (DESYREL) 100 MG tablet Take 100 mg by mouth at bedtime.    [provider]    Family History No family history on file.  Social History Social History   Tobacco Use  . Smoking status: Current Every Day Smoker    Packs/day: 0.25    Types: Cigarettes  . Smokeless tobacco: Never Used  Substance Use Topics  . Alcohol use: No  . Drug use: No     Allergies   Clarithromycin and Penicillins   Review of Systems Review of Systems  Constitutional: Positive for chills and fever (subjective).  HENT: Negative for sore throat.   Eyes: Negative for visual disturbance.  Respiratory: Positive for cough and shortness of breath.   Cardiovascular: Negative for chest pain.  Gastrointestinal: Positive for constipation. Negative for abdominal pain, nausea and vomiting.  Genitourinary: Negative for dysuria.  Musculoskeletal: Positive for arthralgias, myalgias and stiffness.  Skin: Negative for rash.  Neurological: Positive for headaches. Negative for seizures and speech difficulty.     Physical Exam Updated Vital Signs BP (!) 179/81 (BP Location: Left Arm)   Pulse 60   Temp 98 F (36.7 C) (Oral)   Resp 16   Ht 5\' 5"  (1.651 m)   Wt 82.6 kg   SpO2 98%   BMI 30.29 kg/m   Physical Exam Vitals signs and nursing note reviewed.  Constitutional:      General: She is not  in acute distress.    Appearance: She is well-developed.  HENT:     Head: Normocephalic and atraumatic.  Eyes:     Extraocular Movements: Extraocular movements intact.     Conjunctiva/sclera: Conjunctivae normal.     Pupils: Pupils are equal, round, and reactive to light.  Neck:     Musculoskeletal: Neck supple.  Cardiovascular:     Rate and Rhythm: Normal rate and regular rhythm.     Pulses: Normal pulses.     Heart sounds: No murmur.  Pulmonary:     Effort: Pulmonary effort is normal. No respiratory distress.     Breath sounds: Normal breath sounds.  Abdominal:     Palpations: Abdomen is soft.     Tenderness: There is no abdominal tenderness. There is no guarding or rebound.     Hernia: A hernia (ventral, soft) is present.  Musculoskeletal:        General: Tenderness present. No deformity.     Right lower leg: Edema present.     Left lower leg: Edema present.  Skin:    General: Skin is warm and dry.     Capillary Refill: Capillary refill takes less than 2 seconds.  Neurological:     General: No focal deficit present.     Mental Status: She is alert. Mental status is at baseline.      ED Treatments / Results  Labs (all labs ordered are listed, but only abnormal results are displayed) Labs Reviewed  CBC WITH DIFFERENTIAL/PLATELET - Abnormal; Notable for the following components:      Result Value   RBC 3.76 (*)  Hemoglobin 11.2 (*)    HCT 34.7 (*)    Platelets 146 (*)    All other components within normal limits  COMPREHENSIVE METABOLIC PANEL - Abnormal; Notable for the following components:   Sodium 129 (*)    Chloride 91 (*)    Calcium 8.7 (*)    Albumin 3.4 (*)    AST 11 (*)    All other components within normal limits  BRAIN NATRIURETIC PEPTIDE - Abnormal; Notable for the following components:   B Natriuretic Peptide 125.4 (*)    All other components within normal limits  VALPROIC ACID LEVEL - Abnormal; Notable for the following components:   Valproic  Acid Lvl 37 (*)    All other components within normal limits  LIPASE, BLOOD  INFLUENZA PANEL BY PCR (TYPE A & B)  I-STAT TROPONIN, ED    EKG EKG Interpretation  Date/Time:  Tuesday June 17 2018 20:47:46 EST Ventricular Rate:  58 PR Interval:    QRS Duration: 86 QT Interval:  472 QTC Calculation: 464 R Axis:   60 Text Interpretation:  Normal sinus rhythm no acute st/ts similar to prior 12/19 Confirmed by Aletta Edouard [id number]) on 06/17/2018 8:50:13 PM   Radiology Dg Chest 2 View  Result Date: 06/17/2018 CLINICAL DATA:  Cough.  Hypertension. EXAM: CHEST - 2 VIEW COMPARISON:  Multiple exams, including 06/09/2018 FINDINGS: Hazy densities in the upper lobes with prominence of upper zone pulmonary vasculature. Mild cardiomegaly. The patient is rotated to the right on today's radiograph, reducing diagnostic sensitivity and specificity. Mild scarring or atelectasis at the left lung base. Mild blunting of both posterior costophrenic angles. Multiple thoracic compression fractures as shown on prior exams. IMPRESSION: 1. Cardiomegaly with cephalization of blood flow suspicious for pulmonary venous hypertension. 2. Faint hazy opacity over the upper lobes. A component of alveolitis or low-level/resolving edema is not excluded although no consolidation is evident. 3. Mild blunting of the costophrenic angles, can not exclude small pleural effusions. 4.  Aortic Atherosclerosis (ICD10-I70.0). Electronically Signed   By: Van Clines M.D.   On: 06/17/2018 21:13    Procedures Procedures (including critical care time)  Medications Ordered in ED Medications  furosemide (LASIX) tablet 20 mg (has no administration in time range)  ALPRAZolam (XANAX) tablet 0.5 mg (has no administration in time range)  carvedilol (COREG) tablet 25 mg (has no administration in time range)  divalproex (DEPAKOTE) DR tablet 250 mg (has no administration in time range)  docusate sodium (COLACE) capsule 100 mg (has  no administration in time range)  gabapentin (NEURONTIN) capsule 100 mg (has no administration in time range)  hydrALAZINE (APRESOLINE) tablet 100 mg (has no administration in time range)  Melatonin TABS 10 mg (has no administration in time range)  traZODone (DESYREL) tablet 150 mg (has no administration in time range)  ipratropium-albuterol (DUONEB) 0.5-2.5 (3) MG/3ML nebulizer solution 3 mL (3 mLs Nebulization Given 06/17/18 2246)  oxyCODONE-acetaminophen (PERCOCET/ROXICET) 5-325 MG per tablet 1 tablet (1 tablet Oral Given 06/17/18 2246)     Initial Impression / Assessment and Plan / ED Course  I have reviewed the triage vital signs and the nursing notes.  Pertinent labs & imaging results that were available during my care of the patient were reviewed by me and considered in my medical decision making (see chart for details).  Clinical Course as of Jun 17 2333  Tue Jun 17, 2018  2254 Patient's lab work came back with her sodium being 129 which is baseline for her.  Otherwise  her renal function is normal.  Her hemoglobin is slightly low but baseline for her.   [MB]  2300 BMP is mildly elevated at 125.   [MB]  2316 Patient continues to state how lousy feels but there is been no objective evidence that anything significant is going on.  She said they will give her her evening meds if I send her back now so have ordered some of her evening meds.  She is asking for some IV morphine which I do not think she medically needs.  Given her evening dose of her oxycodone.   [MB]  2317 I put a call into her guardian which is through [facility] and are not received a call back yet.  The nurse is calling for her ambulance it will probably take a while.   [MB]  2333 One of the covering social workers from [place] did call back and I reviewed my plan with her. They will leave the info for the patients primary guardian.    [MB]    Clinical Course User Index [MB] Hayden Rasmussen,  MD      Final Clinical Impressions(s) / ED Diagnoses   Final diagnoses:  Constipation, unspecified constipation type  Peripheral edema    ED Discharge Orders         Ordered    polyethylene glycol (MIRALAX) packet  Daily PRN     06/17/18 2248           Hayden Rasmussen, MD 06/17/18 2324    Hayden Rasmussen, MD 06/17/18 [phone]

## 2018-06-18 DIAGNOSIS — K5900 Constipation, unspecified: Secondary | ICD-10-CM | POA: Diagnosis not present

## 2018-06-18 NOTE — ED Notes (Signed)
PT DISCHARGED. INSTRUCTIONS AND PRESCRIPTION GIVEN TO [facility] STAFF. AAOX4. PT IN NO APPARENT DISTRESS WITH MILD PAIN. THE OPPORTUNITY TO ASK QUESTIONS WAS PROVIDED.

## 2018-07-04 ENCOUNTER — Other Ambulatory Visit: Payer: Self-pay

## 2018-07-04 ENCOUNTER — Emergency Department (HOSPITAL_COMMUNITY)
Admission: EM | Admit: 2018-07-04 | Discharge: 2018-07-05 | Disposition: A | Discharge status: Home / Self Care | Payer: Medicare Other | Attending: Emergency Medicine | Admitting: Emergency Medicine

## 2018-07-04 ENCOUNTER — Emergency Department (HOSPITAL_COMMUNITY): Payer: Medicare Other

## 2018-07-04 ENCOUNTER — Encounter (HOSPITAL_COMMUNITY): Payer: Self-pay | Admitting: Emergency Medicine

## 2018-07-04 DIAGNOSIS — I509 Heart failure, unspecified: Secondary | ICD-10-CM | POA: Diagnosis not present

## 2018-07-04 DIAGNOSIS — J449 Chronic obstructive pulmonary disease, unspecified: Secondary | ICD-10-CM | POA: Diagnosis not present

## 2018-07-04 DIAGNOSIS — F17210 Nicotine dependence, cigarettes, uncomplicated: Secondary | ICD-10-CM | POA: Insufficient documentation

## 2018-07-04 DIAGNOSIS — R51 Headache: Secondary | ICD-10-CM | POA: Diagnosis not present

## 2018-07-04 DIAGNOSIS — F419 Anxiety disorder, unspecified: Secondary | ICD-10-CM | POA: Insufficient documentation

## 2018-07-04 DIAGNOSIS — R45851 Suicidal ideations: Secondary | ICD-10-CM | POA: Diagnosis not present

## 2018-07-04 DIAGNOSIS — R05 Cough: Secondary | ICD-10-CM | POA: Insufficient documentation

## 2018-07-04 DIAGNOSIS — E119 Type 2 diabetes mellitus without complications: Secondary | ICD-10-CM | POA: Insufficient documentation

## 2018-07-04 DIAGNOSIS — W050XXA Fall from non-moving wheelchair, initial encounter: Secondary | ICD-10-CM | POA: Diagnosis not present

## 2018-07-04 DIAGNOSIS — I110 Hypertensive heart disease with heart failure: Secondary | ICD-10-CM | POA: Diagnosis not present

## 2018-07-04 DIAGNOSIS — R059 Cough, unspecified: Secondary | ICD-10-CM

## 2018-07-04 DIAGNOSIS — F329 Major depressive disorder, single episode, unspecified: Secondary | ICD-10-CM | POA: Insufficient documentation

## 2018-07-04 DIAGNOSIS — Z8544 Personal history of malignant neoplasm of other female genital organs: Secondary | ICD-10-CM | POA: Diagnosis not present

## 2018-07-04 DIAGNOSIS — W19XXXA Unspecified fall, initial encounter: Secondary | ICD-10-CM

## 2018-07-04 DIAGNOSIS — F251 Schizoaffective disorder, depressive type: Secondary | ICD-10-CM | POA: Diagnosis not present

## 2018-07-04 DIAGNOSIS — R110 Nausea: Secondary | ICD-10-CM | POA: Diagnosis present

## 2018-07-04 LAB — CBC WITH DIFFERENTIAL/PLATELET
Abs Immature Granulocytes: 0.02 10*3/uL (ref 0.00–0.07)
Basophils Absolute: 0 10*3/uL (ref 0.0–0.1)
Basophils Relative: 1 %
Eosinophils Absolute: 0 10*3/uL (ref 0.0–0.5)
Eosinophils Relative: 1 %
HCT: 36.7 % (ref 36.0–46.0)
Hemoglobin: 12.1 g/dL (ref 12.0–15.0)
Immature Granulocytes: 0 %
Lymphocytes Relative: 32 %
Lymphs Abs: 1.7 10*3/uL (ref 0.7–4.0)
MCH: 30.5 pg (ref 26.0–34.0)
MCHC: 33 g/dL (ref 30.0–36.0)
MCV: 92.4 fL (ref 80.0–100.0)
MONO ABS: 0.6 10*3/uL (ref 0.1–1.0)
Monocytes Relative: 11 %
Neutro Abs: 2.9 10*3/uL (ref 1.7–7.7)
Neutrophils Relative %: 55 %
Platelets: 180 10*3/uL (ref 150–400)
RBC: 3.97 MIL/uL (ref 3.87–5.11)
RDW: 13.4 % (ref 11.5–15.5)
WBC: 5.2 10*3/uL (ref 4.0–10.5)
nRBC: 0 % (ref 0.0–0.2)

## 2018-07-04 LAB — COMPREHENSIVE METABOLIC PANEL
ALT: 10 U/L (ref 0–44)
AST: 14 U/L — ABNORMAL LOW (ref 15–41)
Albumin: 3 g/dL — ABNORMAL LOW (ref 3.5–5.0)
Alkaline Phosphatase: 64 U/L (ref 38–126)
Anion gap: 10 (ref 5–15)
BILIRUBIN TOTAL: 0.5 mg/dL (ref 0.3–1.2)
BUN: 9 mg/dL (ref 6–20)
CO2: 28 mmol/L (ref 22–32)
Calcium: 8.3 mg/dL — ABNORMAL LOW (ref 8.9–10.3)
Chloride: 91 mmol/L — ABNORMAL LOW (ref 98–111)
Creatinine, Ser: 0.56 mg/dL (ref 0.44–1.00)
GFR calc Af Amer: >60 mL/min (ref >60)
GFR calc non Af Amer: >60 mL/min (ref >60)
Glucose, Bld: 81 mg/dL (ref 70–99)
POTASSIUM: 4.4 mmol/L (ref 3.5–5.1)
Sodium: 129 mmol/L — ABNORMAL LOW (ref 135–145)
Total Protein: 6.4 g/dL — ABNORMAL LOW (ref 6.5–8.1)

## 2018-07-04 LAB — RAPID URINE DRUG SCREEN, HOSP PERFORMED
Amphetamines: NOT DETECTED
Barbiturates: NOT DETECTED
Benzodiazepines: POSITIVE — AB
Cocaine: NOT DETECTED
Opiates: NOT DETECTED
Tetrahydrocannabinol: NOT DETECTED

## 2018-07-04 LAB — ETHANOL: Alcohol, Ethyl (B): <10 mg/dL (ref <10)

## 2018-07-04 LAB — I-STAT TROPONIN, ED: Troponin i, poc: 0 ng/mL (ref 0.00–0.08)

## 2018-07-04 LAB — URINALYSIS, ROUTINE W REFLEX MICROSCOPIC
Bilirubin Urine: NEGATIVE
Glucose, UA: NEGATIVE mg/dL
Hgb urine dipstick: NEGATIVE
Ketones, ur: NEGATIVE mg/dL
Leukocytes, UA: NEGATIVE
Nitrite: NEGATIVE
Protein, ur: NEGATIVE mg/dL
Specific Gravity, Urine: 1.005 (ref 1.005–1.030)
pH: 7 (ref 5.0–8.0)

## 2018-07-04 MED ORDER — OXYCODONE HCL 5 MG PO TABS
5.0000 mg | ORAL_TABLET | Freq: Once | ORAL | Status: AC
  Administered 2018-07-04: 5 mg via ORAL
  Filled 2018-07-04: qty 1

## 2018-07-04 MED ORDER — IPRATROPIUM-ALBUTEROL 0.5-2.5 (3) MG/3ML IN SOLN
3.0000 mL | Freq: Once | RESPIRATORY_TRACT | Status: AC
  Administered 2018-07-04: 3 mL via RESPIRATORY_TRACT
  Filled 2018-07-04: qty 3

## 2018-07-04 NOTE — ED Notes (Signed)
[facility] of pts status

## 2018-07-04 NOTE — ED Provider Notes (Signed)
Patient signed out to me by Natalie Gilmore, MD.  Please see previous notes for further history.  In brief, patient presenting for evaluation of all over pain after fall yesterday.  Additionally, endorsing SI.  Imaging today was reassuring, she is medically cleared and pending TTS evaluation.  Behavioral health team evaluated the patient, she does not meet inpatient criteria.  As such, will discharge patient.   I called Department of Social Services (legal guardian), was awaiting a call back.  It is documented that notification was unsuccessful, however I eventually received a call back, and notification was successful.    Franchot Heidelberg, PA-C 07/04/18 2107    Maudie Flakes, MD 07/05/18 1246

## 2018-07-04 NOTE — ED Notes (Signed)
Called [facility] 

## 2018-07-04 NOTE — [facility] Counselor (Signed)
Patient assessed by TTS. Per Lindon Romp, NP, patient does not meet inpatient criteria at this time and is psych-cleared.

## 2018-07-04 NOTE — ED Notes (Signed)
Pt does not meet inpatient criteria, [facility] contacted for transport to [facility] facility.  Pt will hold in purple for transport
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
## 2018-07-04 NOTE — ED Provider Notes (Signed)
[facility] Emergency Department Provider Note MRN:  [id number]  Arrival date & time: 07/04/18     Chief Complaint   Fall, [place] History of Present Illness   Natalie Thomas is a 58 y.o. year-old female with a history of CHF, diabetes, schizoaffective disorder presenting to the ED with chief complaint of fall, SI.  Patient explains that she had a fall yesterday in her care facility.  The wheelchair slipped out from underneath her and she fell backwards.  Unsure if she hit her head, unsure if she had loss of consciousness.  Endorsing pain all over her body.  Nauseated, dry heaves since the fall.  Pain is worst in the hips bilaterally.  Pain is constant, 8 out of 10 in severity, worse with motion.  Denies chest pain or shortness of breath, no fever.  Endorsing 2 months of persistent cough.  Patient also endorsing depression, wants to be dead, wants to kill herself.  Denies hallucinations, no drug or alcohol use.    Review of Systems  A complete 10 system review of systems was obtained and all systems are negative except as noted in the HPI and PMH.   Patient's Health History    Past Medical History:  Diagnosis Date  . Acute on chronic respiratory failure with hypoxia and hypercapnia ([place]) 10/07/2017  . Anemia 10/16/2017  . Anxiety and depression   . CHF (congestive heart failure) ([place])   . Chronic hyponatremia 04/08/2007   Qualifier: Diagnosis of  By: Marca Ancona RMA, Lucy    . Chronic respiratory failure with hypoxia ([place]) 10/07/2017  . Closed comminuted intertrochanteric fracture of right femur ([place]) 11/07/2017  . COPD 04/08/2007   Qualifier: Diagnosis of  By: Marca Ancona RMA, Lucy    . Cough 05/08/2011  . Depression   . Diabetes mellitus, type 2 ([place])    pt denies but states that she has been treated for DM  . Dyspnea 10/16/2017  . Dysuria 04/08/2007   Qualifier: Diagnosis of  By: Reatha Armour, Lucy    . Essential hypertension 04/08/2007   Qualifier: Diagnosis of  By: Marca Ancona  RMA, Lucy    . GERD (gastroesophageal reflux disease)   . HIP PAIN, LEFT 12/12/2007   Qualifier: Diagnosis of  By: Loanne Drilling MD, Jacelyn Pi   . Hyperlipidemia 10/07/2017  . Hypertension   . Hyponatremia 07/27/2011  . Incontinence of urine 08/14/2011  . Lumbar disc disease 04/09/2011  . MENOPAUSE, PREMATURE 04/08/2007   Qualifier: Diagnosis of  By: Marca Ancona RMA, Lucy    . 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 Osteoporosis   . Osteoporosis 04/08/2007   Qualifier: Diagnosis of  By: Reatha Armour, Lucy    . Polycythemia secondary to smoking 04/09/2011  . S/P right hip fracture 11/07/2017  . Schizoaffective disorder, bipolar type ([place]) 07/05/2011    Past Surgical History:  Procedure Laterality Date  . EYE SURGERY     on left  eye, pt states that she sees bad out of the right eye and needs surgery there  . INTRAMEDULLARY (IM) NAIL INTERTROCHANTERIC Right 11/13/2017   Procedure: INTRAMEDULLARY (IM) NAIL INTERTROCHANTRIC;  Surgeon: Shona Needles, MD;  Location: [facility];  Service: Orthopedics;  Laterality: Right;  . PELVIC FRACTURE SURGERY      No family history on file.  Social History   Socioeconomic History  . Marital status: Divorced    Spouse name: Not on file  . Number of children:  Not on file  . Years of education: Not on file  . Highest education level: Not on file  Occupational History  . Occupation: Disabled  Social Needs  . Financial resource strain: Not on file  . Food insecurity:    Worry: Not on file    Inability: Not on file  . Transportation needs:    Medical: Not on file    Non-medical: Not on file  Tobacco Use  . Smoking status: Current Every Day Smoker    Packs/day: 0.25    Types: Cigarettes  . Smokeless tobacco: Never Used  Substance and Sexual Activity  . Alcohol use: No  . Drug use: No  . Sexual activity: Never  Lifestyle  . Physical activity:    Days per week: Not on file    Minutes per session: Not on file  .  Stress: Not on file  Relationships  . Social connections:    Talks on phone: Not on file    Gets together: Not on file    Attends religious service: Not on file    Active member of club or organization: Not on file    Attends meetings of clubs or organizations: Not on file    Relationship status: Not on file  . Intimate partner violence:    Fear of current or ex partner: Not on file    Emotionally abused: Not on file    Physically abused: Not on file    Forced sexual activity: Not on file  Other Topics Concern  . Not on file  Social History Narrative   single     Physical Exam  Vital Signs and Nursing Notes reviewed Vitals:   07/04/18 1545  BP: 132/62  Pulse: 90  Resp: 20  SpO2: 99%    CONSTITUTIONAL: Well-appearing, NAD NEURO:  Alert and oriented x 3, no focal deficits EYES:  eyes equal and reactive ENT/NECK:  no LAD, no JVD CARDIO: Regular rate, well-perfused, normal S1 and S2 PULM:  CTAB no wheezing or rhonchi GI/GU:  normal bowel sounds, non-distended, non-tender MSK/SPINE:  No gross deformities, no edema; mild tenderness to palpation to bilateral hips SKIN:  no rash, atraumatic PSYCH: Depressed speech and behavior  Diagnostic and Interventional Summary    EKG Interpretation  Date/Time:    Ventricular Rate:    PR Interval:    QRS Duration:   QT Interval:    QTC Calculation:   R Axis:     Text Interpretation:        Labs Reviewed  COMPREHENSIVE METABOLIC PANEL - Abnormal; Notable for the following components:      Result Value   Sodium 129 (*)    Chloride 91 (*)    Calcium 8.3 (*)    Total Protein 6.4 (*)    Albumin 3.0 (*)    AST 14 (*)    All other components within normal limits  ETHANOL  CBC WITH DIFFERENTIAL/PLATELET  URINALYSIS, ROUTINE W REFLEX MICROSCOPIC  RAPID URINE DRUG SCREEN, HOSP PERFORMED  I-STAT BETA HCG BLOOD, ED ([facility], [facility], [facility] ONLY)  I-STAT TROPONIN, ED    CT HEAD WO CONTRAST  Final Result    DG Pelvis 1-2 Views  Final  Result    DG Chest 2 View  Final Result      Medications  oxyCODONE (Oxy IR/ROXICODONE) immediate release tablet 5 mg (5 mg Oral Given 07/04/18 1648)     Procedures Critical Care  ED Course and Medical Decision Making  I have reviewed the triage  vital signs and the nursing notes.  Pertinent labs & imaging results that were available during my care of the patient were reviewed by me and considered in my medical decision making (see below for details).  Low concern for significant traumatic injury in this 58 year old very chronically appearing female with diffuse pain after fall yesterday.  Abdomen is soft, nontender, given the nausea and dry heaves after the fall, will screen with CT head, chest x-ray to evaluate for traumatic injuries as well as to exclude infectious process given her chronic cough.  X-ray of the pelvis also pending.  Imaging unremarkable, labs at baseline, medically cleared, awaiting TTS recommendations.  Signed out to oncoming provider at shift change.  Barth Kirks. Sedonia Small, [place] [email or web address]  Final Clinical Impressions(s) / ED Diagnoses     ICD-10-CM   1. Suicidal ideation R45.851   2. Cough R05 DG Chest 2 View    DG Chest 2 View  3. Fall, initial encounter [email or web address].[facility]     ED Discharge Orders    None         Maudie Flakes, MD 07/04/18 [phone]

## 2018-07-04 NOTE — ED Triage Notes (Signed)
Per [facility] pt coming from [facility], staff reports patient having suicidal ideations. Patient states she is here due to a fall yesterday. C/o bilateral thigh pain and chronic abdominal pain. Patient drinking a soda with EMS.

## 2018-07-04 NOTE — ED Notes (Signed)
Pt resting at present, breathing easier, remains pending PTAR transport. 02 2 L's [place] in place.

## 2018-07-04 NOTE — BH Assessment (Signed)
[facility] [place] Assessment Progress Note   TTS attempted to complete assessment, but patient has gone for X-Ray and CT Scan.

## 2018-07-04 NOTE — ED Notes (Signed)
Received pt pending PTAR transport.  Comfort measures given. Pt on O2 2 liters [place]. PA called for nebulizer tx.

## 2018-07-05 NOTE — ED Notes (Signed)
Pt continues to call out, yelling aggressively at staff for issues related to facility in which she stays. This EMT attempted multiple times to de-escalate the pt and understand her frustrations. Pt continues to yell, disrespectfully at all staff.   Pt given two new blankets and an additional pair of socks due to complaints of being cold. Pt asked multiple ways and multiple times to lower her voice due to other pts resting, pt remains non-complaint and disruptive at this time.

## 2018-07-05 NOTE — ED Notes (Signed)
[facility] at bedside to transport pt to [facility].

## 2018-11-08 ENCOUNTER — Other Ambulatory Visit: Payer: Self-pay

## 2018-11-08 ENCOUNTER — Emergency Department (HOSPITAL_COMMUNITY)
Admission: EM | Admit: 2018-11-08 | Discharge: 2018-11-11 | Disposition: A | Discharge status: Home / Self Care | Payer: Medicare Other | Attending: Emergency Medicine | Admitting: Emergency Medicine

## 2018-11-08 ENCOUNTER — Encounter (HOSPITAL_COMMUNITY): Payer: Self-pay | Admitting: Emergency Medicine

## 2018-11-08 ENCOUNTER — Emergency Department (HOSPITAL_COMMUNITY): Payer: Medicare Other

## 2018-11-08 DIAGNOSIS — E785 Hyperlipidemia, unspecified: Secondary | ICD-10-CM | POA: Insufficient documentation

## 2018-11-08 DIAGNOSIS — E119 Type 2 diabetes mellitus without complications: Secondary | ICD-10-CM | POA: Insufficient documentation

## 2018-11-08 DIAGNOSIS — F17210 Nicotine dependence, cigarettes, uncomplicated: Secondary | ICD-10-CM | POA: Insufficient documentation

## 2018-11-08 DIAGNOSIS — Z79899 Other long term (current) drug therapy: Secondary | ICD-10-CM | POA: Diagnosis not present

## 2018-11-08 DIAGNOSIS — R451 Restlessness and agitation: Secondary | ICD-10-CM | POA: Diagnosis not present

## 2018-11-08 DIAGNOSIS — N3000 Acute cystitis without hematuria: Secondary | ICD-10-CM | POA: Diagnosis not present

## 2018-11-08 DIAGNOSIS — R0602 Shortness of breath: Secondary | ICD-10-CM | POA: Insufficient documentation

## 2018-11-08 DIAGNOSIS — F209 Schizophrenia, unspecified: Secondary | ICD-10-CM | POA: Diagnosis present

## 2018-11-08 DIAGNOSIS — Z03818 Encounter for observation for suspected exposure to other biological agents ruled out: Secondary | ICD-10-CM | POA: Diagnosis not present

## 2018-11-08 DIAGNOSIS — Z7901 Long term (current) use of anticoagulants: Secondary | ICD-10-CM | POA: Diagnosis not present

## 2018-11-08 DIAGNOSIS — Z7982 Long term (current) use of aspirin: Secondary | ICD-10-CM | POA: Diagnosis not present

## 2018-11-08 DIAGNOSIS — I110 Hypertensive heart disease with heart failure: Secondary | ICD-10-CM | POA: Diagnosis not present

## 2018-11-08 DIAGNOSIS — R4689 Other symptoms and signs involving appearance and behavior: Secondary | ICD-10-CM

## 2018-11-08 DIAGNOSIS — Z1159 Encounter for screening for other viral diseases: Secondary | ICD-10-CM | POA: Insufficient documentation

## 2018-11-08 DIAGNOSIS — I509 Heart failure, unspecified: Secondary | ICD-10-CM | POA: Insufficient documentation

## 2018-11-08 DIAGNOSIS — R454 Irritability and anger: Secondary | ICD-10-CM

## 2018-11-08 DIAGNOSIS — J449 Chronic obstructive pulmonary disease, unspecified: Secondary | ICD-10-CM | POA: Insufficient documentation

## 2018-11-08 DIAGNOSIS — J9611 Chronic respiratory failure with hypoxia: Secondary | ICD-10-CM | POA: Diagnosis not present

## 2018-11-08 LAB — CBC WITH DIFFERENTIAL/PLATELET
Abs Immature Granulocytes: 0.01 10*3/uL (ref 0.00–0.07)
Basophils Absolute: 0 10*3/uL (ref 0.0–0.1)
Basophils Relative: 0 %
Eosinophils Absolute: 0.1 10*3/uL (ref 0.0–0.5)
Eosinophils Relative: 1 %
HCT: 40.2 % (ref 36.0–46.0)
Hemoglobin: 13.5 g/dL (ref 12.0–15.0)
Immature Granulocytes: 0 %
Lymphocytes Relative: 26 %
Lymphs Abs: 2 10*3/uL (ref 0.7–4.0)
MCH: 30.8 pg (ref 26.0–34.0)
MCHC: 33.6 g/dL (ref 30.0–36.0)
MCV: 91.6 fL (ref 80.0–100.0)
Monocytes Absolute: 0.8 10*3/uL (ref 0.1–1.0)
Monocytes Relative: 10 %
Neutro Abs: 4.8 10*3/uL (ref 1.7–7.7)
Neutrophils Relative %: 63 %
Platelets: 206 10*3/uL (ref 150–400)
RBC: 4.39 MIL/uL (ref 3.87–5.11)
RDW: 13.8 % (ref 11.5–15.5)
WBC: 7.6 10*3/uL (ref 4.0–10.5)
nRBC: 0 % (ref 0.0–0.2)

## 2018-11-08 LAB — COMPREHENSIVE METABOLIC PANEL
ALT: 8 U/L (ref 0–44)
AST: 11 U/L — ABNORMAL LOW (ref 15–41)
Albumin: 3.2 g/dL — ABNORMAL LOW (ref 3.5–5.0)
Alkaline Phosphatase: 65 U/L (ref 38–126)
Anion gap: 9 (ref 5–15)
BUN: 14 mg/dL (ref 6–20)
CO2: 26 mmol/L (ref 22–32)
Calcium: 8.6 mg/dL — ABNORMAL LOW (ref 8.9–10.3)
Chloride: 92 mmol/L — ABNORMAL LOW (ref 98–111)
Creatinine, Ser: 0.8 mg/dL (ref 0.44–1.00)
GFR calc Af Amer: >60 mL/min (ref >60)
GFR calc non Af Amer: >60 mL/min (ref >60)
Glucose, Bld: 116 mg/dL — ABNORMAL HIGH (ref 70–99)
Potassium: 4 mmol/L (ref 3.5–5.1)
Sodium: 127 mmol/L — ABNORMAL LOW (ref 135–145)
Total Bilirubin: 0.3 mg/dL (ref 0.3–1.2)
Total Protein: 6.5 g/dL (ref 6.5–8.1)

## 2018-11-08 LAB — RAPID URINE DRUG SCREEN, HOSP PERFORMED
Amphetamines: NOT DETECTED
Barbiturates: NOT DETECTED
Benzodiazepines: POSITIVE — AB
Cocaine: NOT DETECTED
Opiates: NOT DETECTED
Tetrahydrocannabinol: NOT DETECTED

## 2018-11-08 LAB — URINALYSIS, ROUTINE W REFLEX MICROSCOPIC
Bilirubin Urine: NEGATIVE
Glucose, UA: NEGATIVE mg/dL
Hgb urine dipstick: NEGATIVE
Ketones, ur: NEGATIVE mg/dL
Nitrite: NEGATIVE
Protein, ur: NEGATIVE mg/dL
Specific Gravity, Urine: 1.01 (ref 1.005–1.030)
pH: 6 (ref 5.0–8.0)

## 2018-11-08 LAB — ETHANOL: Alcohol, Ethyl (B): <10 mg/dL (ref <10)

## 2018-11-08 LAB — SARS CORONAVIRUS 2 BY RT PCR (HOSPITAL ORDER, PERFORMED IN ~~LOC~~ HOSPITAL LAB): SARS Coronavirus 2: NEGATIVE

## 2018-11-08 MED ORDER — CEPHALEXIN 250 MG PO CAPS
500.0000 mg | ORAL_CAPSULE | Freq: Three times a day (TID) | ORAL | Status: DC
  Administered 2018-11-08 – 2018-11-11 (×9): 500 mg via ORAL
  Filled 2018-11-08 (×9): qty 2

## 2018-11-08 MED ORDER — SODIUM CHLORIDE 0.9 % IV SOLN: 1.0000 g | Freq: Once | INTRAVENOUS | Status: DC

## 2018-11-08 MED ORDER — ALBUTEROL SULFATE HFA 108 (90 BASE) MCG/ACT IN AERS
4.0000 | INHALATION_SPRAY | Freq: Once | RESPIRATORY_TRACT | Status: AC
  Administered 2018-11-08: 16:00:00 4 via RESPIRATORY_TRACT
  Filled 2018-11-08: qty 6.7

## 2018-11-08 MED ORDER — SODIUM CHLORIDE 0.9 % IV BOLUS
500.0000 mL | Freq: Once | INTRAVENOUS | Status: AC
  Administered 2018-11-08: 17:00:00 500 mL via INTRAVENOUS

## 2018-11-08 MED ORDER — AEROCHAMBER PLUS FLO-VU MISC
1.0000 | Freq: Once | Status: AC
  Administered 2018-11-08: 16:00:00 1
  Filled 2018-11-08: qty 1

## 2018-11-08 MED ORDER — AEROCHAMBER PLUS FLO-VU LARGE MISC
Status: AC
  Filled 2018-11-08: qty 1

## 2018-11-08 MED ORDER — DIVALPROEX SODIUM 250 MG PO DR TAB
750.0000 mg | DELAYED_RELEASE_TABLET | Freq: Two times a day (BID) | ORAL | Status: DC
  Administered 2018-11-08 – 2018-11-11 (×6): 750 mg via ORAL
  Filled 2018-11-08 (×6): qty 3

## 2018-11-08 MED ORDER — HALOPERIDOL LACTATE 5 MG/ML IJ SOLN
5.0000 mg | Freq: Once | INTRAMUSCULAR | Status: AC
  Administered 2018-11-08: 19:00:00 5 mg via INTRAVENOUS
  Filled 2018-11-08: qty 1

## 2018-11-08 MED ORDER — CEPHALEXIN 250 MG PO CAPS
500.0000 mg | ORAL_CAPSULE | Freq: Once | ORAL | Status: DC
  Filled 2018-11-08: qty 2

## 2018-11-08 MED ORDER — GABAPENTIN 300 MG PO CAPS
300.0000 mg | ORAL_CAPSULE | Freq: Three times a day (TID) | ORAL | Status: DC
  Administered 2018-11-08 – 2018-11-11 (×8): 300 mg via ORAL
  Filled 2018-11-08 (×8): qty 1

## 2018-11-08 MED ORDER — OLANZAPINE 5 MG PO TABS
5.0000 mg | ORAL_TABLET | Freq: Two times a day (BID) | ORAL | Status: DC
  Administered 2018-11-08 – 2018-11-11 (×6): 5 mg via ORAL
  Filled 2018-11-08 (×6): qty 1

## 2018-11-08 MED ORDER — NYSTATIN 100000 UNIT/GM EX CREA
TOPICAL_CREAM | Freq: Two times a day (BID) | CUTANEOUS | Status: DC
  Administered 2018-11-08: 1 via TOPICAL
  Administered 2018-11-09 (×2): via TOPICAL
  Administered 2018-11-10: 1 via TOPICAL
  Administered 2018-11-10 – 2018-11-11 (×2): via TOPICAL
  Filled 2018-11-08 (×2): qty 15

## 2018-11-08 MED ORDER — PREDNISONE 20 MG PO TABS
60.0000 mg | ORAL_TABLET | Freq: Once | ORAL | Status: AC
  Administered 2018-11-08: 60 mg via ORAL
  Filled 2018-11-08: qty 3
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
## 2018-11-08 NOTE — ED Notes (Signed)
Sitter is with pt playing soft music. Lights are dimmed to quiet environment. Pt seems to respond to music and dim lighting.

## 2018-11-08 NOTE — ED Notes (Signed)
TTS completed via Natalie Thomas Recommend observation and monitor.

## 2018-11-08 NOTE — ED Notes (Signed)
Pt has been wanded

## 2018-11-08 NOTE — BH Assessment (Signed)
[facility] Assessment Progress Note  Case was staffed with Lord DNP who recommended patient be observed and monitored for safety.     

## 2018-11-09 ENCOUNTER — Encounter (HOSPITAL_COMMUNITY): Payer: Self-pay | Admitting: Registered Nurse

## 2018-11-09 DIAGNOSIS — R454 Irritability and anger: Secondary | ICD-10-CM | POA: Diagnosis not present

## 2018-11-09 DIAGNOSIS — R4689 Other symptoms and signs involving appearance and behavior: Secondary | ICD-10-CM | POA: Diagnosis not present

## 2018-11-09 DIAGNOSIS — R451 Restlessness and agitation: Secondary | ICD-10-CM | POA: Insufficient documentation

## 2018-11-09 DIAGNOSIS — F209 Schizophrenia, unspecified: Secondary | ICD-10-CM | POA: Diagnosis not present

## 2018-11-09 MED ORDER — PRAVASTATIN SODIUM 10 MG PO TABS
20.0000 mg | ORAL_TABLET | Freq: Every day | ORAL | Status: DC
  Administered 2018-11-09 – 2018-11-11 (×3): 20 mg via ORAL
  Filled 2018-11-09 (×3): qty 2

## 2018-11-09 MED ORDER — FERROUS SULFATE 325 (65 FE) MG PO TABS
325.0000 mg | ORAL_TABLET | Freq: Every day | ORAL | Status: DC
  Administered 2018-11-09 – 2018-11-11 (×3): 325 mg via ORAL
  Filled 2018-11-09 (×3): qty 1

## 2018-11-09 MED ORDER — FLUTICASONE PROPIONATE 50 MCG/ACT NA SUSP
2.0000 | Freq: Two times a day (BID) | NASAL | Status: DC
  Administered 2018-11-09 – 2018-11-11 (×4): 2 via NASAL
  Filled 2018-11-09 (×2): qty 16

## 2018-11-09 MED ORDER — ALPRAZOLAM 0.5 MG PO TABS
0.5000 mg | ORAL_TABLET | Freq: Two times a day (BID) | ORAL | Status: DC | PRN
  Administered 2018-11-09 – 2018-11-11 (×3): 0.5 mg via ORAL
  Filled 2018-11-09 (×3): qty 1

## 2018-11-09 MED ORDER — DOCUSATE SODIUM 100 MG PO CAPS: 100.0000 mg | ORAL_CAPSULE | Freq: Two times a day (BID) | ORAL | Status: DC | PRN

## 2018-11-09 MED ORDER — DICYCLOMINE HCL 20 MG PO TABS: 20.0000 mg | ORAL_TABLET | Freq: Two times a day (BID) | ORAL | Status: DC | PRN

## 2018-11-09 MED ORDER — ACETAMINOPHEN 325 MG PO TABS
650.0000 mg | ORAL_TABLET | Freq: Two times a day (BID) | ORAL | Status: DC | PRN
  Administered 2018-11-09 – 2018-11-11 (×3): 650 mg via ORAL
  Filled 2018-11-09 (×4): qty 2

## 2018-11-09 MED ORDER — NICOTINE 21 MG/24HR TD PT24
21.0000 mg | MEDICATED_PATCH | Freq: Once | TRANSDERMAL | Status: AC
  Administered 2018-11-09: 15:00:00 21 mg via TRANSDERMAL
  Filled 2018-11-09: qty 1

## 2018-11-09 MED ORDER — ALPRAZOLAM 0.5 MG PO TABS: 0.5000 mg | ORAL_TABLET | Freq: Two times a day (BID) | ORAL | 0 refills | Status: DC

## 2018-11-09 MED ORDER — MELATONIN 3 MG PO TABS
9.0000 mg | ORAL_TABLET | Freq: Every day | ORAL | Status: DC
  Administered 2018-11-10 (×2): 9 mg via ORAL
  Filled 2018-11-09 (×5): qty 3

## 2018-11-09 MED ORDER — SODIUM CHLORIDE 1 G PO TABS
1.0000 g | ORAL_TABLET | Freq: Three times a day (TID) | ORAL | Status: DC
  Administered 2018-11-09 – 2018-11-11 (×6): 1 g via ORAL
  Filled 2018-11-09 (×9): qty 1

## 2018-11-09 MED ORDER — ALPRAZOLAM 0.5 MG PO TABS
0.5000 mg | ORAL_TABLET | Freq: Once | ORAL | Status: AC
  Administered 2018-11-09: 0.5 mg via ORAL
  Filled 2018-11-09: qty 1

## 2018-11-09 MED ORDER — LORATADINE 10 MG PO TABS
10.0000 mg | ORAL_TABLET | Freq: Every day | ORAL | Status: DC
  Administered 2018-11-09 – 2018-11-11 (×3): 10 mg via ORAL
  Filled 2018-11-09 (×3): qty 1

## 2018-11-09 MED ORDER — ALBUTEROL SULFATE HFA 108 (90 BASE) MCG/ACT IN AERS
2.0000 | INHALATION_SPRAY | RESPIRATORY_TRACT | Status: DC | PRN
  Administered 2018-11-09 – 2018-11-10 (×3): 2 via RESPIRATORY_TRACT
  Filled 2018-11-09 (×2): qty 6.7

## 2018-11-09 MED ORDER — ALUM & MAG HYDROXIDE-SIMETH 200-200-20 MG/5ML PO SUSP
30.0000 mL | Freq: Four times a day (QID) | ORAL | Status: DC | PRN
  Filled 2018-11-09: qty 30

## 2018-11-09 MED ORDER — PANTOPRAZOLE SODIUM 40 MG PO TBEC
40.0000 mg | DELAYED_RELEASE_TABLET | Freq: Every day | ORAL | Status: DC
  Administered 2018-11-09 – 2018-11-11 (×3): 40 mg via ORAL
  Filled 2018-11-09 (×3): qty 1

## 2018-11-09 MED ORDER — CARVEDILOL 12.5 MG PO TABS
25.0000 mg | ORAL_TABLET | Freq: Two times a day (BID) | ORAL | Status: DC
  Administered 2018-11-09 – 2018-11-11 (×4): 25 mg via ORAL
  Filled 2018-11-09 (×5): qty 2

## 2018-11-09 MED ORDER — POLYETHYLENE GLYCOL 3350 17 G PO PACK
17.0000 g | PACK | Freq: Every day | ORAL | Status: DC | PRN
  Administered 2018-11-09: 15:00:00 17 g via ORAL
  Filled 2018-11-09: qty 1

## 2018-11-09 MED ORDER — NIFEDIPINE ER OSMOTIC RELEASE 90 MG PO TB24
90.0000 mg | ORAL_TABLET | Freq: Every day | ORAL | Status: DC
  Administered 2018-11-09 – 2018-11-11 (×3): 90 mg via ORAL
  Filled 2018-11-09 (×3): qty 1

## 2018-11-09 MED ORDER — FUROSEMIDE 20 MG PO TABS
20.0000 mg | ORAL_TABLET | Freq: Two times a day (BID) | ORAL | Status: DC
  Administered 2018-11-09 – 2018-11-11 (×5): 20 mg via ORAL
  Filled 2018-11-09 (×5): qty 1

## 2018-11-09 MED ORDER — HYDRALAZINE HCL 50 MG PO TABS
100.0000 mg | ORAL_TABLET | Freq: Three times a day (TID) | ORAL | Status: DC
  Administered 2018-11-09 – 2018-11-11 (×6): 100 mg via ORAL
  Filled 2018-11-09 (×9): qty 2

## 2018-11-09 MED ORDER — OXYCODONE HCL 5 MG PO TABS
5.0000 mg | ORAL_TABLET | Freq: Once | ORAL | Status: AC
  Administered 2018-11-09: 13:00:00 5 mg via ORAL
  Filled 2018-11-09: qty 1

## 2018-11-09 MED ORDER — FLUTICASONE FUROATE-VILANTEROL 100-25 MCG/INH IN AEPB
1.0000 | INHALATION_SPRAY | Freq: Every day | RESPIRATORY_TRACT | Status: DC
  Administered 2018-11-09 – 2018-11-11 (×2): 1 via RESPIRATORY_TRACT
  Filled 2018-11-09: qty 28

## 2018-11-09 NOTE — ED Notes (Signed)
Patient denies pain and is resting comfortably.  

## 2018-11-09 NOTE — ED Notes (Signed)
Pt given Xanax as ordered d/t pt noted to continue w/talking loudly and attempting to be argumentative. Pt then apologizes for her behavior and states "I can't help it". Pt assisted to more comfortable position in bed.

## 2018-11-09 NOTE — ED Notes (Addendum)
Pt noted to be irritable - states she was not given her medicine earlier. RN advised pt she was given meds and advised her of documented times. Pt then apologized for her behavior.

## 2018-11-09 NOTE — ED Provider Notes (Signed)
Patient is currently under an IVC for inappropriate behavior at her facility.  Received a call from behavioral health who feels her IVC can be lifted and she can return back to her facility.  They have made some adjustments to her medications.  They asked that I decrease her Xanax to twice a day from 3 times a day.  They also recommend that she will need a Depakote level in about a week to be ordered by her PCP.   Hayden Rasmussen, MD 11/09/18 [phone]

## 2018-11-09 NOTE — ED Notes (Addendum)
Pt noted to be irritable initially. Pt then apologized. Took meds w/o difficulty. Pt asking for "Oxy and my Xanax". Pt noted to be lying in bed w/head elevated. O2 infusing at 2L/min via n/c. Pure Wick to pt.

## 2018-11-09 NOTE — ED Notes (Signed)
Pt noted to be irritable and demanding.

## 2018-11-09 NOTE — ED Notes (Signed)
Called pharmacy for melatonin. Pt is fidgety and restless.  Pt is calling out every few minutes for something. Pt has already had snacks and drink and nightly meds.

## 2018-11-09 NOTE — ED Notes (Signed)
Pt asking for lunch tray. Advised pt Kitchen advised should be delivered soon. Pt pointing to her abd x 2 stating "it feels full" I"m not peeing". Advised pt Natalie Thomas is intact and draining yellow urine.

## 2018-11-09 NOTE — ED Notes (Signed)
TTS bedside 

## 2018-11-09 NOTE — Discharge Instructions (Addendum)
You were seen in the emergency department for shortness of breath and agitation.  You were evaluated by behavioral health and they have made some recommendations for medication adjustments.   They also recommend decreasing your Xanax from 3 times a day to twice a day.  Your Depakote level was lower than recommended.  It will be important for you to follow-up with your primary care doctor as you will need a Depakote level checked.   Please take all of your antibiotics until finished!   You may develop abdominal discomfort or diarrhea from the antibiotic.  You may help offset this with probiotics which you can buy or get in yogurt. Do not eat or take the probiotics until 2 hours after your antibiotic.   Continue to use the nystatin cream for total of 7 days.  If the rash persists, follow-up with a primary care provider or dermatologist on this matter.

## 2018-11-09 NOTE — Progress Notes (Signed)
CSW contacted [facility] about patient be cleared by psych and ready to return to facility. CSW spoke with executive director Assunta Gambles regarding patient's return. CSW noted his concerns of patient being cleared in less than 24 hours and his concerns of patient's behavior/aggression towards his staff and other residents. CSW discussed the psych assessment and where she was denying SI/HI and appeared in a stable demeanor. CSW noted their concerns as patient's behavior was quite severe this past week. CSW discussed UTI treatment. CSW noted they wanted to review the psych assessment and reach out to their contracted psychiatrist prior to accepting patient back. CSW faxed over psych assessment. CSW will follow for discharge supports.  Lamonte Richer, LCSW, [place] Worker II [phone]

## 2018-11-09 NOTE — Consult Note (Signed)
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 Appearance: Casual  Eye Contact:  Good  Speech:  Clear and Coherent  Volume:  Normal to increased  when agitated  Mood:  Anxious and Irritable  Affect:  Congruent  Thought Process:  Coherent  Orientation:  Full (Time, Place, and Person)  Thought Content:  Rumination  Suicidal Thoughts:  No  Homicidal Thoughts:  No  Memory:  Immediate;   Fair  Judgement:  Fair  Insight:  Fair  Psychomotor Activity:  Normal  Concentration:  Concentration: Fair and Attention Span: Fair  Recall:  [employer] of Knowledge:  Fair  Language:  Good  Akathisia:  No  Handed:  Right  AIMS (if indicated):     Assets:  Desire for Improvement  ADL's:  Impaired  Needs assistance  Cognition:  WNL  Sleep:  Treatment Plan Summary: Plan Psychiatrically clear,   Recommendation:  Check Depakote level.  Restart Xanax at 0.5 mg Bid (primary physician can gradually taper off)     Disposition: No evidence of imminent risk to self or others at present.   Patient does not meet criteria for psychiatric inpatient  admission. Supportive therapy provided about ongoing stressors. Discussed crisis plan, support from social network, calling 911, coming to the Emergency Department, and calling Suicide Hotline. Spoke with Dr. Melina Copa; informed of above recommendation and disposition This service was provided via telemedicine using a 2-way, interactive audio and video technology.  Names of all persons participating in this telemedicine service and their role in this encounter. Name: Earleen Newport, NP Role: Tele psych  Name: Dr. Dwyane Dee Role: Psychiatrist  Name: Natalie Thomas Role: Patient  Name: Dr. Melina Copa Role: Informed of above recommendation and disposition     Shuvon Rankin, NP 11/09/2018 12:39 PM
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
## 2018-11-09 NOTE — ED Notes (Signed)
Pt given graham crackers and diet coke.

## 2018-11-09 NOTE — ED Notes (Signed)
Pt talking loudly telling staff "Stop talking about me. Y'all have problems too". Assured pt staff are not talking about her - allowed pt to vent feelings/concerns.

## 2018-11-09 NOTE — ED Notes (Signed)
Lunch order placed

## 2018-11-10 DIAGNOSIS — F209 Schizophrenia, unspecified: Secondary | ICD-10-CM | POA: Diagnosis not present

## 2018-11-10 MED ORDER — CARVEDILOL 12.5 MG PO TABS
25.0000 mg | ORAL_TABLET | Freq: Once | ORAL | Status: AC
  Administered 2018-11-10: 18:00:00 25 mg via ORAL
  Filled 2018-11-10: qty 2

## 2018-11-10 MED ORDER — HYDROXYZINE HCL 25 MG PO TABS
25.0000 mg | ORAL_TABLET | Freq: Three times a day (TID) | ORAL | Status: DC | PRN
  Administered 2018-11-10: 01:00:00 25 mg via ORAL
  Filled 2018-11-10: qty 1

## 2018-11-10 MED ORDER — MAGNESIUM OXIDE 400 (241.3 MG) MG PO TABS
400.0000 mg | ORAL_TABLET | Freq: Every day | ORAL | Status: DC
  Administered 2018-11-10 – 2018-11-11 (×2): 400 mg via ORAL
  Filled 2018-11-10 (×2): qty 1

## 2018-11-10 MED ORDER — ASPIRIN 81 MG PO CHEW
81.0000 mg | CHEWABLE_TABLET | Freq: Every day | ORAL | Status: DC
  Administered 2018-11-10 – 2018-11-11 (×2): 81 mg via ORAL
  Filled 2018-11-10 (×2): qty 1

## 2018-11-10 MED ORDER — TRAZODONE HCL 50 MG PO TABS
150.0000 mg | ORAL_TABLET | Freq: Every day | ORAL | Status: DC
  Administered 2018-11-10 (×2): 150 mg via ORAL
  Filled 2018-11-10 (×2): qty 1

## 2018-11-10 MED ORDER — PAROXETINE HCL 30 MG PO TABS
30.0000 mg | ORAL_TABLET | Freq: Every day | ORAL | Status: DC
  Administered 2018-11-10 – 2018-11-11 (×2): 30 mg via ORAL
  Filled 2018-11-10 (×2): qty 1

## 2018-11-10 MED ORDER — BUPROPION HCL ER (XL) 150 MG PO TB24
150.0000 mg | ORAL_TABLET | Freq: Every day | ORAL | Status: DC
  Administered 2018-11-10 – 2018-11-11 (×2): 150 mg via ORAL
  Filled 2018-11-10 (×2): qty 1

## 2018-11-10 MED ORDER — IPRATROPIUM-ALBUTEROL 0.5-2.5 (3) MG/3ML IN SOLN: 3.0000 mL | RESPIRATORY_TRACT | Status: DC

## 2018-11-10 NOTE — ED Notes (Signed)
Texted md about medication for pt due to anxiety.

## 2018-11-10 NOTE — ED Provider Notes (Addendum)
Emergency Medicine Observation Re-evaluation Note  Natalie Thomas is a 58 y.o. female, seen on rounds today.  Pt initially presented to the ED for complaints of Psychiatric Evaluation and Agitation Currently, the patient is Sleeping. Patient IVC lifted yesterday, however her nursing home [facility] would not accept her because they stated "has not been 24 hours."  DH at bedtime is supposed to come on and do a review of her today and the expectation is if they clear her she may go directly back to the nursing home.  Physical Exam  BP (!) 130/54 (BP Location: Left Arm)   Pulse 67   Temp 98.4 F (36.9 C) (Oral)   Resp 18   SpO2 95%  Physical Exam Constitutional: She appears well-developed and well-nourished. No distress.  HENT:  Head: Normocephalic and atraumatic.  Pulmonary/Chest: Effort normal  No respiratory distress.  Abdominal: No distension Neurological: She is sleeping.  Skin:  She is not diaphoretic.  ED Course / MDM  EKG:EKG Interpretation  Date/Time:  Saturday Nov 08 2018 15:35:17 EDT Ventricular Rate:  76 PR Interval:    QRS Duration: 80 QT Interval:  416 QTC Calculation: 468 R Axis:   74 Text Interpretation:  Sinus rhythm Normal ECG When compared with ECG of 06/17/2018, No significant change was found Confirmed by Delora Fuel ([id number]) on 11/10/2018 12:13:28 AM  Clinical Course as of Nov 11 2301  Tue Nov 11, 2018  1055 RN, Jacqlyn Larsen, states patient has been accepted back at her facility of [facility] as of this morning.  IVC will have to be rescinded.   [SJ]  4132 GM, Jacqlyn Larsen, states patient is to be transferred at 1 pm.   [SJ]    Clinical Course User Index [SJ] Joy, Shawn C, PA-C   I have reviewed the labs performed to date as well as medications administered while in observation.  Patient has persistently low sodium levels which are being corrected with oral sodium tablets 3 times daily.  I did a medication review with [facility] and they did not recommend  any changes unless psych gets involved.  She is not having any confusion, weakness or other signs of serious hyponatremia.. Results for orders placed or performed during the hospital encounter of 11/08/18  SARS Coronavirus 2 (CEPHEID- Performed in [place] hospital lab), [facility] Order  Result Value Ref Range   SARS Coronavirus 2 NEGATIVE NEGATIVE  Comprehensive metabolic panel  Result Value Ref Range   Sodium 127 (L) 135 - 145 mmol/L   Potassium 4.0 3.5 - 5.1 mmol/L   Chloride 92 (L) 98 - 111 mmol/L   CO2 26 22 - 32 mmol/L   Glucose, Bld 116 (H) 70 - 99 mg/dL   BUN 14 6 - 20 mg/dL   Creatinine, Ser 0.80 0.44 - 1.00 mg/dL   Calcium 8.6 (L) 8.9 - 10.3 mg/dL   Total Protein 6.5 6.5 - 8.1 g/dL   Albumin 3.2 (L) 3.5 - 5.0 g/dL   AST 11 (L) 15 - 41 U/L   ALT 8 0 - 44 U/L   Alkaline Phosphatase 65 38 - 126 U/L   Total Bilirubin 0.3 0.3 - 1.2 mg/dL   GFR calc non Af Amer >60 >60 mL/min   GFR calc Af Amer >60 >60 mL/min   Anion gap 9 5 - 15  Ethanol  Result Value Ref Range   Alcohol, Ethyl (B) <10 <10 mg/dL  Urine rapid drug screen (hosp performed)  Result Value Ref Range   Opiates  NONE DETECTED NONE DETECTED   Cocaine NONE DETECTED NONE DETECTED   Benzodiazepines POSITIVE (A) NONE DETECTED   Amphetamines NONE DETECTED NONE DETECTED   Tetrahydrocannabinol NONE DETECTED NONE DETECTED   Barbiturates NONE DETECTED NONE DETECTED  CBC with Diff  Result Value Ref Range   WBC 7.6 4.0 - 10.5 K/uL   RBC 4.39 3.87 - 5.11 MIL/uL   Hemoglobin 13.5 12.0 - 15.0 g/dL   HCT 40.2 36.0 - 46.0 %   MCV 91.6 80.0 - 100.0 fL   MCH 30.8 26.0 - 34.0 pg   MCHC 33.6 30.0 - 36.0 g/dL   RDW 13.8 11.5 - 15.5 %   Platelets 206 150 - 400 K/uL   nRBC 0.0 0.0 - 0.2 %   Neutrophils Relative % 63 %   Neutro Abs 4.8 1.7 - 7.7 K/uL   Lymphocytes Relative 26 %   Lymphs Abs 2.0 0.7 - 4.0 K/uL   Monocytes Relative 10 %   Monocytes Absolute 0.8 0.1 - 1.0 K/uL   Eosinophils Relative 1 %   Eosinophils Absolute 0.1  0.0 - 0.5 K/uL   Basophils Relative 0 %   Basophils Absolute 0.0 0.0 - 0.1 K/uL   Immature Granulocytes 0 %   Abs Immature Granulocytes 0.01 0.00 - 0.07 K/uL  Urinalysis, Routine w reflex microscopic  Result Value Ref Range   Color, Urine YELLOW YELLOW   APPearance HAZY (A) CLEAR   Specific Gravity, Urine 1.010 1.005 - 1.030   pH 6.0 5.0 - 8.0   Glucose, UA NEGATIVE NEGATIVE mg/dL   Hgb urine dipstick NEGATIVE NEGATIVE   Bilirubin Urine NEGATIVE NEGATIVE   Ketones, ur NEGATIVE NEGATIVE mg/dL   Protein, ur NEGATIVE NEGATIVE mg/dL   Nitrite NEGATIVE NEGATIVE   Leukocytes,Ua LARGE (A) NEGATIVE   RBC / HPF 0-5 0 - 5 RBC/hpf   WBC, UA 21-50 0 - 5 WBC/hpf   Bacteria, UA RARE (A) NONE SEEN   Squamous Epithelial / LPF 6-10 0 - 5   Mucus PRESENT    Hyaline Casts, UA PRESENT     Plan  Current plan is for [facility] evaluation and return to facility. Patient is not under full IVC at this time.   Margarita Mail, PA-C 11/10/18 [place], [place], PA-C 11/10/18 [place], DO 11/10/18 [place], [place], PA-C 11/12/18 [place], [place], DO 11/14/18 1513

## 2018-11-10 NOTE — ED Notes (Signed)
Breakfast tray ordered 

## 2018-11-10 NOTE — ED Notes (Addendum)
Upon introduction pt found to be uncooperative, excessively loud, and disrespectful towards staff. With time pt calmed down and requested to speak to staff to apologize. Afterwards, pt presented as calm, cooperative and pleasant.
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
## 2018-11-10 NOTE — ED Notes (Signed)
Coreg 12.5 tablet dropped on the floor. Contacted pharmacy to order new pill.

## 2018-11-11 DIAGNOSIS — F209 Schizophrenia, unspecified: Secondary | ICD-10-CM | POA: Diagnosis not present

## 2018-11-11 LAB — URINE CULTURE: Culture: >=100000 — AB

## 2018-11-11 LAB — VALPROIC ACID LEVEL: Valproic Acid Lvl: 44 ug/mL — ABNORMAL LOW (ref 50.0–100.0)

## 2018-11-11 MED ORDER — DIVALPROEX SODIUM 250 MG PO DR TAB: 750.0000 mg | DELAYED_RELEASE_TABLET | Freq: Two times a day (BID) | ORAL | 0 refills | Status: DC

## 2018-11-11 MED ORDER — NYSTATIN 100000 UNIT/GM EX CREA: TOPICAL_CREAM | CUTANEOUS | 0 refills | Status: DC

## 2018-11-11 MED ORDER — CEPHALEXIN 500 MG PO CAPS: 500.0000 mg | ORAL_CAPSULE | Freq: Three times a day (TID) | ORAL | 0 refills | Status: AC

## 2018-11-11 NOTE — ED Notes (Signed)
Lunch order placed

## 2018-11-11 NOTE — ED Notes (Signed)
Phill[employer] crackers given to pt @ snack time.

## 2018-11-11 NOTE — ED Provider Notes (Signed)
Emergency Medicine Observation Re-evaluation Note  Natalie Thomas is a 58 y.o. female, seen on rounds today.  Pt initially presented to the ED for complaints of Psychiatric Evaluation and Agitation Currently, the patient is calm, eating breakfast, sitting upright in bed.  She has had a productive cough for several weeks, but denies any new symptoms.  Physical Exam  BP 139/64 (BP Location: Left Arm)   Pulse 66   Temp 98.4 F (36.9 C) (Oral)   Resp 18   SpO2 97%  Physical Exam Vitals signs and nursing note reviewed.  Constitutional:      General: She is not in acute distress.    Appearance: She is well-developed. She is not diaphoretic.  HENT:     Head: Normocephalic and atraumatic.     Mouth/Throat:     Mouth: Mucous membranes are moist.     Pharynx: Oropharynx is clear.  Eyes:     Conjunctiva/sclera: Conjunctivae normal.  Neck:     Musculoskeletal: Neck supple.  Cardiovascular:     Rate and Rhythm: Normal rate and regular rhythm.     Pulses: Normal pulses.          Radial pulses are 2+ on the right side and 2+ on the left side.       Posterior tibial pulses are 2+ on the right side and 2+ on the left side.     Heart sounds: Normal heart sounds.     Comments: Tactile temperature in the extremities appropriate and equal bilaterally. Pulmonary:     Effort: Pulmonary effort is normal. No respiratory distress.     Breath sounds: Normal breath sounds.  Abdominal:     Palpations: Abdomen is soft.     Tenderness: There is no abdominal tenderness. There is no guarding.  Musculoskeletal:     Right lower leg: No edema.     Left lower leg: No edema.  Lymphadenopathy:     Cervical: No cervical adenopathy.  Skin:    General: Skin is warm and dry.  Neurological:     Mental Status: She is alert.  Psychiatric:        Mood and Affect: Mood and affect normal.        Speech: Speech normal.        Behavior: Behavior normal.     ED Course / MDM  EKG:EKG  Interpretation  Date/Time:  Saturday Nov 08 2018 15:35:17 EDT Ventricular Rate:  76 PR Interval:    QRS Duration: 80 QT Interval:  416 QTC Calculation: 468 R Axis:   74 Text Interpretation:  Sinus rhythm Normal ECG When compared with ECG of 06/17/2018, No significant change was found Confirmed by Delora Fuel ([id number]) on 11/10/2018 12:13:28 AM    Abnormal Labs Reviewed  URINE CULTURE - Abnormal; Notable for the following components:      Result Value   Culture >=100,000 COLONIES/mL ESCHERICHIA COLI (*)    Organism ID, Bacteria ESCHERICHIA COLI (*)    All other components within normal limits  COMPREHENSIVE METABOLIC PANEL - Abnormal; Notable for the following components:   Sodium 127 (*)    Chloride 92 (*)    Glucose, Bld 116 (*)    Calcium 8.6 (*)    Albumin 3.2 (*)    AST 11 (*)    All other components within normal limits  RAPID URINE DRUG SCREEN, HOSP PERFORMED - Abnormal; Notable for the following components:   Benzodiazepines POSITIVE (*)    All other components within normal limits  URINALYSIS, ROUTINE W REFLEX MICROSCOPIC - Abnormal; Notable for the following components:   APPearance HAZY (*)    Leukocytes,Ua LARGE (*)    Bacteria, UA RARE (*)    All other components within normal limits   Dg Chest Port 1 View  Result Date: 11/08/2018 CLINICAL DATA:  Acute onset of shortness of breath. EXAM: PORTABLE CHEST 1 VIEW COMPARISON:  07/04/2018 and earlier. FINDINGS: Cardiac silhouette moderately enlarged, unchanged. Pulmonary venous hypertension without overt edema. No confluent or ground-glass airspace opacities. No visible pleural effusions. Bronchovascular markings normal. IMPRESSION: Stable cardiomegaly. Pulmonary venous hypertension without overt edema. No acute cardiopulmonary disease. Electronically Signed   By: Evangeline Dakin M.D.   On: 11/08/2018 16:36   Sodium  Date Value Ref Range Status  11/08/2018 127 (L) 135 - 145 mmol/L Final  07/04/2018 129 (L) 135 - 145  mmol/L Final  06/17/2018 129 (L) 135 - 145 mmol/L Final  06/09/2018 126 (L) 135 - 145 mmol/L Final  06/23/2012 128 (L) 136 - 145 mmol/L Final  06/22/2012 136 136 - 145 mmol/L Final  06/05/2012 134 (L) 136 - 145 mmol/L Final  06/05/2012 133 (L) 136 - 145 mmol/L Final   I have reviewed the labs performed to date as well as medications administered while in observation.  No noted changes in the last 24 hours.    Medications  nystatin cream (MYCOSTATIN) ( Topical Given 11/11/18 0946)  cephALEXin (KEFLEX) capsule 500 mg (500 mg Oral Given 11/11/18 0557)  divalproex (DEPAKOTE) DR tablet 750 mg (750 mg Oral Given 11/11/18 0946)  gabapentin (NEURONTIN) capsule 300 mg (300 mg Oral Given 11/11/18 0945)  OLANZapine (ZYPREXA) tablet 5 mg (5 mg Oral Given 11/11/18 0945)  acetaminophen (TYLENOL) tablet 650 mg (650 mg Oral Given 11/11/18 0557)  albuterol (VENTOLIN HFA) 108 (90 Base) MCG/ACT inhaler 2 puff (2 puffs Inhalation Given 11/10/18 1639)  alum & mag hydroxide-simeth (MAALOX/MYLANTA) 200-200-20 MG/5ML suspension 30 mL (has no administration in time range)  carvedilol (COREG) tablet 25 mg (25 mg Oral Given 11/11/18 0944)  dicyclomine (BENTYL) tablet 20 mg (has no administration in time range)  docusate sodium (COLACE) capsule 100 mg (has no administration in time range)  ferrous sulfate tablet 325 mg (325 mg Oral Given 11/11/18 0945)  fluticasone (FLONASE) 50 MCG/ACT nasal spray 2 spray (2 sprays Each Nare Given 11/11/18 0944)  fluticasone furoate-vilanterol (BREO ELLIPTA) 100-25 MCG/INH 1 puff (1 puff Inhalation Given 11/11/18 0943)  furosemide (LASIX) tablet 20 mg (20 mg Oral Given 11/11/18 0945)  hydrALAZINE (APRESOLINE) tablet 100 mg (100 mg Oral Given 11/11/18 0945)  loratadine (CLARITIN) tablet 10 mg (10 mg Oral Given 11/11/18 0945)  NIFEdipine (PROCARDIA XL/NIFEDICAL-XL) 24 hr tablet 90 mg (90 mg Oral Given 11/11/18 0945)  pantoprazole (PROTONIX) EC tablet 40 mg (40 mg Oral Given 11/11/18 0945)   pravastatin (PRAVACHOL) tablet 20 mg (20 mg Oral Given 11/11/18 0945)  sodium chloride tablet 1 g (1 g Oral Given 11/11/18 0945)  Melatonin TABS 9 mg (9 mg Oral Given 11/10/18 2335)  polyethylene glycol (MIRALAX / GLYCOLAX) packet 17 g (17 g Oral Given 11/09/18 1443)  ALPRAZolam (XANAX) tablet 0.5 mg (0.5 mg Oral Given 11/11/18 0945)  aspirin chewable tablet 81 mg (81 mg Oral Given 11/11/18 0944)  buPROPion (WELLBUTRIN XL) 24 hr tablet 150 mg (150 mg Oral Given 11/11/18 0944)  hydrOXYzine (ATARAX/VISTARIL) tablet 25 mg (25 mg Oral Given 11/10/18 0049)  ipratropium-albuterol (DUONEB) 0.5-2.5 (3) MG/3ML nebulizer solution 3 mL (has no administration in time range)  magnesium oxide (MAG-OX) tablet 400 mg (400 mg Oral Given 11/11/18 0945)  PARoxetine (PAXIL) tablet 30 mg (30 mg Oral Given 11/11/18 0945)  traZODone (DESYREL) tablet 150 mg (150 mg Oral Given 11/10/18 2337)  albuterol (VENTOLIN HFA) 108 (90 Base) MCG/ACT inhaler 4 puff (4 puffs Inhalation Given 11/08/18 1619)  aerochamber plus with mask device 1 each (1 each Other Given 11/08/18 1622)  predniSONE (DELTASONE) tablet 60 mg (60 mg Oral Given 11/08/18 1617)  sodium chloride 0.9 % bolus 500 mL (0 mLs Intravenous Stopped 11/08/18 1840)  haloperidol lactate (HALDOL) injection 5 mg (5 mg Intravenous Given 11/08/18 1851)  ALPRAZolam (XANAX) tablet 0.5 mg (0.5 mg Oral Given 11/09/18 1325)  oxyCODONE (Oxy IR/ROXICODONE) immediate release tablet 5 mg (5 mg Oral Given 11/09/18 1325)  nicotine (NICODERM CQ - dosed in mg/24 hours) patch 21 mg (21 mg Transdermal Patch Applied 11/09/18 1443)  carvedilol (COREG) tablet 25 mg (25 mg Oral Given 11/10/18 1733)   Plan  Current plan is for patient to ideally return to her SNF. Patient is under full IVC at this time.  Clinical Course as of Nov 10 1652  Tue Nov 11, 2018  1055 RN, Jacqlyn Larsen, states patient has been accepted back at her facility of [facility] as of this morning.  IVC will have to be rescinded.   [SJ]   5400 QQ, Jacqlyn Larsen, states patient is to be transferred at 1 pm.   [SJ]    Clinical Course User Index [SJ] Kharma Sampsel C, PA-C   Patient here under IVC due to aggressive behavior.  Upon record review, it seemed as though patient was initially not going to be welcomed back to her facility.  Social work was able to determine today that the patient would be able to return to [facility].  She has had no noted issues with aggression this morning.  IVC was rescinded by attending physician. During her ED course, her Depakote was increased to 750 mg twice daily. Shortly before patient was set to return to her SNF, it was brought to my attention that on May 17 Shuvon Rankin, psych NP recommended a Depakote level be checked.  This order was not placed by the NP. Once this was brought to my attention, I ordered this test.  It turned out to be subtherapeutic.  Since the patient has not been on an increased dosage of Depakote for very long, we will continue the patient at this current dosage and have her level retested via PCP.  Nystatin cream was initiated during her ED course due to what was thought to be a fungal rash on her abdomen and under her breast.   Keflex was initiated due to evidence of UTI on her UA. Patient's urine culture from May 16 returned positive for E. Coli, sensitive to ceftriaxone and cefazolin.  Based on this information, I expect her UTI to respond to Keflex sufficiently.  I wrote the patient prescriptions for nystatin, the rest of her Keflex course, and her increased dosage of Depakote.    Vitals:   11/09/18 2022 11/10/18 1613 11/10/18 2219 11/11/18 0556  BP: (!) 130/54 (!) 166/101 (!) 169/63 139/64  Pulse: 67 (!) 58 (!) 58 66  Resp: 18 18 18 18   Temp: 98.4 F (36.9 C) 97.7 F (36.5 C) 97.6 F (36.4 C) 98.4 F (36.9 C)  TempSrc: Oral  Oral Oral  SpO2: 95% 95% 94% 97%      Lorayne Bender, PA-C 11/11/18 1711    Virgel Manifold,  MD 11/12/18 1219

## 2018-11-11 NOTE — ED Notes (Signed)
Lunch @ bedside. 

## 2018-11-11 NOTE — TOC Transition Note (Signed)
Transition of Care [facility]) - CM/SW Discharge Note   Patient Details  Name: Natalie Thomas MRN: [id number] Date of Birth: [birth year]  Transition of Care [facility]) CM/SW Contact:  Natalie Endo, LCSW Phone Number: 11/11/2018, 11:38 AM   Clinical Narrative:     CSW spoke with Natalie Thomas at [facility] to determine if there was a bed available and there is. Patient was accepted by Natalie Thomas into room #208 at [facility]. CSW notified patient's legal guardian Natalie Thomas of transfer. CSW provided Natalie Arm, RN with number to call for report.   CSW scheduled PTAR for transportation at 1pm today.  Final next level of care: [place] Barriers to Discharge: No Barriers Identified   Patient Goals and CMS Choice Patient states their goals for this hospitalization and ongoing recovery are:: Return to facility      Discharge Placement              Patient chooses bed at: [facility])   Name of family member notified: None    Discharge Plan and Services                                     Social Determinants of Health (SDOH) Interventions     Readmission Risk Interventions No flowsheet data found.

## 2018-11-11 NOTE — ED Notes (Signed)
S Joy, PA, reviewed pt's urine culture result.

## 2018-11-11 NOTE — NC FL2 (Signed)
[place] MEDICAID FL2 LEVEL OF CARE SCREENING TOOL     IDENTIFICATION  Patient Name: Natalie Thomas Birthdate: 29-Oct-1960 Sex: female Admission Date (Current Location): 11/08/2018  [address]ospital [address] and [place] Number:  Herbalist and Address:  [facility]. [facility] [place] [address], [place], [place] [address]      Provider Number: [id number]  Attending Physician Name and Address:  Default, Provider, MD  Relative Name and Phone Number:  [name]- legal guardian    Current Level of Care: Hospital Recommended Level of Care: [place] Prior Approval Number:    Date Approved/Denied:   PASRR Number:    Discharge Plan:      Current Diagnoses: Patient Active Problem List   Diagnosis Date Noted  . Irritability 11/09/2018  . Aggressive behavior 11/09/2018  . Agitation   . S/P right hip fracture 11/07/2017  . Closed comminuted intertrochanteric fracture of right femur ([place]) 11/07/2017  . Anxiety and depression   . Dyspnea 10/16/2017  . Anemia 10/16/2017  . COPD exacerbation ([place]) 10/07/2017  . Hyperlipidemia 10/07/2017  . Chronic respiratory failure with hypoxia ([place]) 10/07/2017  . Acute on chronic respiratory failure with hypoxia and hypercapnia ([place]) 10/07/2017  . Incontinence of urine 08/14/2011  . Hyponatremia 07/27/2011  . Schizoaffective disorder, bipolar type ([place]) 07/05/2011  . Cough 05/08/2011  . Encounter for long-term (current) use of other medications 04/09/2011  . Lumbar disc disease 04/09/2011  . Polycythemia secondary to smoking 04/09/2011  . HIP PAIN, LEFT 12/12/2007  . 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 DIABETES MELLITUS, TYPE II 04/08/2007  . MENOPAUSE, PREMATURE 04/08/2007  . Chronic hyponatremia 04/08/2007  . Schizoaffective disorder ([place]) 04/08/2007  . DEPRESSION 04/08/2007  . Essential hypertension 04/08/2007  . COPD 04/08/2007  . GERD 04/08/2007  . Osteoporosis 04/08/2007  . DYSURIA 04/08/2007     Orientation RESPIRATION BLADDER Height & Weight     Self, Time, Situation, Place    Incontinent Weight:   Height:     BEHAVIORAL SYMPTOMS/MOOD NEUROLOGICAL BOWEL NUTRITION STATUS  Verbally abusive   Incontinent Diet  AMBULATORY STATUS COMMUNICATION OF NEEDS Skin   Limited Assist Verbally Other (Comment)(Redness under right breast and right skin fold under belly, has been microstatin)                       Personal Care Assistance Level of Assistance  Total care, Dressing, Feeding, Bathing Bathing Assistance: Limited assistance Feeding assistance: Independent Dressing Assistance: Limited assistance Total Care Assistance: Limited assistance   Functional Limitations Info  Speech, Hearing, Sight Sight Info: Adequate Hearing Info: Adequate Speech Info: Adequate    SPECIAL CARE FACTORS FREQUENCY  OT (By licensed OT), PT (By licensed PT)     PT Frequency: 5x weekly OT Frequency: 5x weekly            Contractures Contractures Info: Not present    Additional Factors Info  Allergies   Allergies Info: Clarithromycin, Penicillin           Current Medications (11/11/2018):  This is the current hospital active medication list Current Facility-Administered Medications  Medication Dose Route Frequency Provider Last Rate Last Dose  . acetaminophen (TYLENOL) tablet 650 mg  650 mg Oral BID PRN Jacqlyn Larsen, PA-C   650 mg at 11/11/18 0557  . albuterol (VENTOLIN HFA) 108 (90 Base) MCG/ACT inhaler 2 puff  2 puff Inhalation Q4H PRN Jacqlyn Larsen, PA-C   2 puff at 11/10/18 1639  .  ALPRAZolam Duanne Moron) tablet 0.5 mg  0.5 mg Oral BID PRN Ward, Ozella Almond, PA-C   0.5 mg at 11/11/18 0945  . alum & mag hydroxide-simeth (MAALOX/MYLANTA) 200-200-20 MG/5ML suspension 30 mL  30 mL Oral QID PRN Jacqlyn Larsen, PA-C      . aspirin chewable tablet 81 mg  81 mg Oral Daily Delora Fuel, MD   81 mg at 11/11/18 0944  . buPROPion (WELLBUTRIN XL) 24 hr tablet 150 mg  150 mg Oral Daily  Delora Fuel, MD   606 mg at 11/11/18 0944  . carvedilol (COREG) tablet 25 mg  25 mg Oral BID WC Benedetto Goad N, PA-C   25 mg at 11/11/18 0944  . cephALEXin (KEFLEX) capsule 500 mg  500 mg Oral Q8H Benedetto Goad N, PA-C   500 mg at 11/11/18 0557  . dicyclomine (BENTYL) tablet 20 mg  20 mg Oral BID PRN Jacqlyn Larsen, PA-C      . divalproex (DEPAKOTE) DR tablet 750 mg  750 mg Oral Q12H Patrecia Pour, NP   750 mg at 11/11/18 0946  . docusate sodium (COLACE) capsule 100 mg  100 mg Oral BID PRN Jacqlyn Larsen, PA-C      . ferrous sulfate tablet 325 mg  325 mg Oral Daily Jacqlyn Larsen, [place]   325 mg at 11/11/18 0945  . fluticasone (FLONASE) 50 MCG/ACT nasal spray 2 spray  2 spray Each [place] BID Jacqlyn Larsen, PA-C   2 spray at 11/11/18 0944  . fluticasone furoate-vilanterol (BREO ELLIPTA) 100-25 MCG/INH 1 puff  1 puff Inhalation Daily Jacqlyn Larsen, [place]   1 puff at 11/11/18 3016  . furosemide (LASIX) tablet 20 mg  20 mg Oral BID Benedetto Goad N, PA-C   20 mg at 11/11/18 0945  . gabapentin (NEURONTIN) capsule 300 mg  300 mg Oral TID Patrecia Pour, NP   300 mg at 11/11/18 0945  . hydrALAZINE (APRESOLINE) tablet 100 mg  100 mg Oral TID Jacqlyn Larsen, PA-C   100 mg at 11/11/18 0945  . hydrOXYzine (ATARAX/VISTARIL) tablet 25 mg  25 mg Oral TID PRN Delora Fuel, MD   25 mg at 11/10/18 0049  . ipratropium-albuterol (DUONEB) 0.5-2.5 (3) MG/3ML nebulizer solution 3 mL  3 mL Nebulization See admin instructions Delora Fuel, MD      . loratadine (CLARITIN) tablet 10 mg  10 mg Oral Daily Benedetto Goad N, PA-C   10 mg at 11/11/18 0945  . magnesium oxide (MAG-OX) tablet 400 mg  010 mg Oral Daily Delora Fuel, MD   932 mg at 11/11/18 0945  . Melatonin TABS 9 mg  9 mg Oral QHS Benedetto Goad N, [place]   9 mg at 11/10/18 2335  . NIFEdipine (PROCARDIA XL/NIFEDICAL-XL) 24 hr tablet 90 mg  90 mg Oral Daily Jacqlyn Larsen, PA-C   90 mg at 11/11/18 0945  . nystatin cream (MYCOSTATIN)   Topical BID Benedetto Goad N, PA-C       . OLANZapine [facility]) tablet 5 mg  5 mg Oral BID Patrecia Pour, NP   5 mg at 11/11/18 0945  . pantoprazole (PROTONIX) EC tablet 40 mg  40 mg Oral Daily Jacqlyn Larsen, PA-C   40 mg at 11/11/18 0945  . PARoxetine (PAXIL) tablet 30 mg  30 mg Oral Daily Delora Fuel, MD   30 mg at 11/11/18 0945  . polyethylene glycol (MIRALAX / GLYCOLAX) packet 17 g  17 g Oral Daily PRN  Hayden Rasmussen, MD   17 g at 11/09/18 1443  . pravastatin (PRAVACHOL) tablet 20 mg  20 mg Oral Daily Benedetto Goad N, PA-C   20 mg at 11/11/18 0945  . sodium chloride tablet 1 g  1 g Oral TID Jacqlyn Larsen, PA-C   1 g at 11/11/18 0945  . traZODone (DESYREL) tablet 150 mg  326 mg Oral QHS Delora Fuel, MD   712 mg at 11/10/18 2337   Current Outpatient Medications  Medication Sig Dispense Refill  . acetaminophen (TYLENOL) 325 MG tablet Take 650 mg by mouth 2 (two) times daily as needed (pain). Do not exceed 3 grams/24 hours of tylenol from all sources    . acetaminophen (TYLENOL) 500 MG tablet Take 500 mg by mouth every 6 (six) hours as needed (fever 99.5-101 F). Dose not to exceed 2000 mg in 24 hours    . albuterol (PROVENTIL HFA;VENTOLIN HFA) 108 (90 Base) MCG/ACT inhaler Inhale 2 puffs into the lungs every 4 (four) hours as needed for wheezing or shortness of breath. (Patient taking differently: Inhale 2 puffs into the lungs every 4 (four) hours as needed for shortness of breath. ) 1 Inhaler 0  . aluminum-magnesium hydroxide-simethicone (MAALOX) 200-200-20 MG/5ML SUSP Take 30 mLs by mouth 4 (four) times daily as needed (heartburn, indigestion).    Marland Kitchen asenapine (SAPHRIS) 5 MG SUBL 24 hr tablet Place 5 mg under the tongue 2 (two) times daily.    Marland Kitchen aspirin 81 MG tablet Take 1 tablet (81 mg total) by mouth daily. For heart health 30 tablet 0  . buPROPion (WELLBUTRIN XL) 150 MG 24 hr tablet Take 150 mg by mouth daily.    . carvedilol (COREG) 25 MG tablet Take 25 mg by mouth 2 (two) times daily with a meal.    . dicyclomine (BENTYL)  20 MG tablet Take 1 tablet (20 mg total) by mouth 2 (two) times daily. (Patient taking differently: Take 20 mg by mouth 2 (two) times daily as needed (abdominal pain). ) 20 tablet 0  . divalproex (DEPAKOTE) 500 MG DR tablet Take 500 mg by mouth 2 (two) times daily.    Marland Kitchen docusate sodium (COLACE) 100 MG capsule Take 100 mg by mouth 2 (two) times daily as needed (constipation).     . ferrous sulfate 325 (65 FE) MG tablet Take 325 mg by mouth daily.     . fluticasone (FLONASE) 50 MCG/ACT nasal spray Place 2 sprays into both nostrils 2 (two) times daily.    . fluticasone furoate-vilanterol (BREO ELLIPTA) 100-25 MCG/INH AEPB Inhale 1 puff into the lungs daily.    . furosemide (LASIX) 20 MG tablet Take 20 mg by mouth 2 (two) times daily.    Marland Kitchen gabapentin (NEURONTIN) 300 MG capsule Take 300 mg by mouth 2 (two) times daily.     Marland Kitchen guaifenesin (ROBAFEN) 100 MG/5ML syrup Take 200 mg by mouth 3 (three) times daily as needed for cough or congestion.    . hydrALAZINE (APRESOLINE) 100 MG tablet Take 100 mg by mouth 3 (three) times daily.    . hydrOXYzine (ATARAX/VISTARIL) 25 MG tablet Take 1 tablet (25 mg total) by mouth 3 (three) times daily as needed for anxiety. (Patient taking differently: Take 25 mg by mouth 3 (three) times daily. ) 15 tablet 0  . ipratropium-albuterol (DUONEB) 0.5-2.5 (3) MG/3ML SOLN Take 3 mLs by nebulization See admin instructions. Inhale one vial via hand held nebulizer three times daily, may also use every 8 hours as needed  for shortness of breath    . loperamide (IMODIUM A-D) 2 MG tablet Take 2 mg by mouth as needed for diarrhea or loose stools (not to exceed 8 doses in 24 hours).     . loratadine (CLARITIN) 10 MG tablet Take 10 mg by mouth daily.    Marland Kitchen lurasidone (LATUDA) 40 MG TABS tablet Take 40 mg by mouth 2 (two) times a day.    . magnesium oxide (MAG-OX) 400 (241.3 Mg) MG tablet Take 2 tablets (800 mg total) by mouth 2 (two) times daily. (Patient taking differently: Take 400 mg by  mouth daily. )    . Melatonin 10 MG TABS Take 10 mg by mouth at bedtime.    Marland Kitchen neomycin-bacitracin-polymyxin (NEOSPORIN) ointment Apply 1 application topically daily as needed (minor skin tears or abrasions).     Marland Kitchen NIFEdipine (PROCARDIA XL/ADALAT-CC) 90 MG 24 hr tablet Take 90 mg by mouth daily.    Marland Kitchen nystatin cream (MYCOSTATIN) Apply 1 application topically daily.    Marland Kitchen omeprazole (PRILOSEC) 20 MG capsule Take 1 capsule (20 mg total) by mouth 2 (two) times daily before a meal. For acid reflux. (Patient taking differently: Take 20 mg by mouth daily. For acid reflux.)    . oxyCODONE (OXY IR/ROXICODONE) 5 MG immediate release tablet Take 5 mg by mouth daily.     [place] (OYSTER CALCIUM) 500 MG TABS tablet Take 500 mg of elemental calcium by mouth daily with breakfast.     . PARoxetine (PAXIL) 30 MG tablet Take 30 mg by mouth daily.    . polyethylene glycol (MIRALAX) packet Take 17 g by mouth daily as needed for severe constipation. 14 each 0  . pravastatin (PRAVACHOL) 20 MG tablet Take 20 mg by mouth daily.    . Probiotic Product (PROBIOTIC PO) Take 1 capsule by mouth daily.     . sodium chloride 1 g tablet Take 1 g by mouth 3 (three) times daily.    . traZODone (DESYREL) 150 MG tablet Take 150 mg by mouth at bedtime.     Marland Kitchen buPROPion (WELLBUTRIN XL) 300 MG 24 hr tablet Take 1 tablet (300 mg total) by mouth every morning. (Patient not taking: Reported on 11/08/2018) 30 tablet 0  . divalproex (DEPAKOTE) 250 MG DR tablet Take 1 tablet (250 mg total) by mouth every 12 (twelve) hours. (Patient not taking: Reported on 11/08/2018) 6 tablet 0  . lurasidone (LATUDA) 80 MG TABS tablet Take 1 tablet (80 mg total) by mouth daily with breakfast. (Patient not taking: Reported on 11/08/2018) 4 tablet 0  . PARoxetine (PAXIL) 20 MG tablet Take 1 tablet (20 mg total) by mouth daily. (Patient not taking: Reported on 11/08/2018)       Discharge Medications: Please see discharge summary for a list of discharge  medications.  Relevant Imaging Results:  Relevant Lab Results:   Additional Information SSN: [id number]  Archie Endo, LCSW

## 2018-11-11 NOTE — Progress Notes (Addendum)
CSW spoke with [name] who is the patient's legal guardian. [name] states that she spoke with the ED of [facility] yesterday to determine whether or not patient could return. ED stated that the patient would be accepted back at [facility] today.   CSW placed call to [place], RN to provide her with an update. Patient is medically stable and ready to return to [facility].  CSW left voicemail with Assunta Gambles, director of admissions at [facility] with no answer so a voicemail was left requesting a return call.  Madilyn Fireman, MSW, LCSW-A Clinical Social Worker [facility] [phone]

## 2018-11-11 NOTE — ED Notes (Signed)
[facility] has arrived to transport pt back to [facility]. ALL belongings - 1 labeled belongings bag - PTAR. Pt unable to sign signature pad.

## 2018-11-11 NOTE — ED Notes (Signed)
IVC papers rescinded by Dr Wilson Singer - copy faxed to [facility] - copy sent to Medical Records - original placed in folder for Magistrate.

## 2018-11-12 ENCOUNTER — Telehealth: Payer: Self-pay | Admitting: Emergency Medicine

## 2018-11-12 NOTE — Telephone Encounter (Signed)
Post ED Visit - Positive Culture Follow-up  Culture report reviewed by antimicrobial stewardship pharmacist: [place] Team []  Elenor Quinones, Pharm.D. []  Heide Guile, Pharm.D., BCPS AQ-ID []  Parks Neptune, Pharm.D., BCPS []  Alycia Rossetti, Pharm.D., BCPS []  [place], Pharm.D., BCPS, AAHIVP []  Legrand Como, Pharm.D., BCPS, AAHIVP [x]  Salome Arnt, PharmD, BCPS []  Johnnette Gourd, PharmD, BCPS []  Hughes Better, PharmD, BCPS []  Leeroy Cha, PharmD []  Laqueta Linden, PharmD, BCPS []  Albertina Parr, PharmD  [place] Team []  Leodis Sias, PharmD []  Lindell Spar, PharmD []  Royetta Asal, PharmD []  Graylin Shiver, Rph []  Rema Fendt) Glennon Mac, PharmD []  Arlyn Dunning, PharmD []  Netta Cedars, PharmD []  Dia Sitter, PharmD []  Leone Haven, PharmD []  Gretta Arab, PharmD []  Theodis Shove, PharmD []  Peggyann Juba, PharmD []  Reuel Boom, PharmD   Positive urine culture Treated with cephalexin, organism sensitive to the same and no further patient follow-up is required at this time.  Hazle Nordmann 11/12/2018, 10:01 AM

## 2018-12-03 ENCOUNTER — Inpatient Hospital Stay (HOSPITAL_COMMUNITY)
Admission: EM | Admit: 2018-12-03 | Discharge: 2018-12-18 | DRG: 208 | Disposition: A | Discharge status: Skilled Nursing Facility | Payer: Medicare Other | Attending: Internal Medicine | Admitting: Internal Medicine

## 2018-12-03 ENCOUNTER — Emergency Department (HOSPITAL_COMMUNITY): Payer: Medicare Other

## 2018-12-03 ENCOUNTER — Other Ambulatory Visit: Payer: Self-pay

## 2018-12-03 ENCOUNTER — Encounter (HOSPITAL_COMMUNITY): Payer: Self-pay

## 2018-12-03 DIAGNOSIS — Z79891 Long term (current) use of opiate analgesic: Secondary | ICD-10-CM

## 2018-12-03 DIAGNOSIS — F419 Anxiety disorder, unspecified: Secondary | ICD-10-CM | POA: Diagnosis not present

## 2018-12-03 DIAGNOSIS — J189 Pneumonia, unspecified organism: Secondary | ICD-10-CM | POA: Diagnosis not present

## 2018-12-03 DIAGNOSIS — F17210 Nicotine dependence, cigarettes, uncomplicated: Secondary | ICD-10-CM | POA: Diagnosis present

## 2018-12-03 DIAGNOSIS — Y95 Nosocomial condition: Secondary | ICD-10-CM | POA: Diagnosis present

## 2018-12-03 DIAGNOSIS — J441 Chronic obstructive pulmonary disease with (acute) exacerbation: Secondary | ICD-10-CM | POA: Diagnosis not present

## 2018-12-03 DIAGNOSIS — E878 Other disorders of electrolyte and fluid balance, not elsewhere classified: Secondary | ICD-10-CM | POA: Diagnosis present

## 2018-12-03 DIAGNOSIS — R0602 Shortness of breath: Secondary | ICD-10-CM | POA: Diagnosis present

## 2018-12-03 DIAGNOSIS — E872 Acidosis: Secondary | ICD-10-CM | POA: Diagnosis present

## 2018-12-03 DIAGNOSIS — K5900 Constipation, unspecified: Secondary | ICD-10-CM | POA: Diagnosis present

## 2018-12-03 DIAGNOSIS — J9622 Acute and chronic respiratory failure with hypercapnia: Principal | ICD-10-CM | POA: Diagnosis present

## 2018-12-03 DIAGNOSIS — E785 Hyperlipidemia, unspecified: Secondary | ICD-10-CM | POA: Diagnosis present

## 2018-12-03 DIAGNOSIS — F39 Unspecified mood [affective] disorder: Secondary | ICD-10-CM | POA: Diagnosis not present

## 2018-12-03 DIAGNOSIS — G893 Neoplasm related pain (acute) (chronic): Secondary | ICD-10-CM | POA: Diagnosis present

## 2018-12-03 DIAGNOSIS — E876 Hypokalemia: Secondary | ICD-10-CM | POA: Diagnosis present

## 2018-12-03 DIAGNOSIS — N390 Urinary tract infection, site not specified: Secondary | ICD-10-CM | POA: Diagnosis present

## 2018-12-03 DIAGNOSIS — Z792 Long term (current) use of antibiotics: Secondary | ICD-10-CM

## 2018-12-03 DIAGNOSIS — E871 Hypo-osmolality and hyponatremia: Secondary | ICD-10-CM | POA: Diagnosis not present

## 2018-12-03 DIAGNOSIS — I509 Heart failure, unspecified: Secondary | ICD-10-CM

## 2018-12-03 DIAGNOSIS — I5033 Acute on chronic diastolic (congestive) heart failure: Secondary | ICD-10-CM | POA: Diagnosis present

## 2018-12-03 DIAGNOSIS — K219 Gastro-esophageal reflux disease without esophagitis: Secondary | ICD-10-CM | POA: Diagnosis present

## 2018-12-03 DIAGNOSIS — G9341 Metabolic encephalopathy: Secondary | ICD-10-CM | POA: Diagnosis not present

## 2018-12-03 DIAGNOSIS — I110 Hypertensive heart disease with heart failure: Secondary | ICD-10-CM | POA: Diagnosis present

## 2018-12-03 DIAGNOSIS — J9600 Acute respiratory failure, unspecified whether with hypoxia or hypercapnia: Secondary | ICD-10-CM

## 2018-12-03 DIAGNOSIS — Z20828 Contact with and (suspected) exposure to other viral communicable diseases: Secondary | ICD-10-CM | POA: Diagnosis present

## 2018-12-03 DIAGNOSIS — J440 Chronic obstructive pulmonary disease with acute lower respiratory infection: Secondary | ICD-10-CM | POA: Diagnosis not present

## 2018-12-03 DIAGNOSIS — E119 Type 2 diabetes mellitus without complications: Secondary | ICD-10-CM | POA: Diagnosis not present

## 2018-12-03 DIAGNOSIS — J9621 Acute and chronic respiratory failure with hypoxia: Secondary | ICD-10-CM | POA: Diagnosis present

## 2018-12-03 DIAGNOSIS — J9601 Acute respiratory failure with hypoxia: Secondary | ICD-10-CM | POA: Diagnosis present

## 2018-12-03 DIAGNOSIS — Z7951 Long term (current) use of inhaled steroids: Secondary | ICD-10-CM

## 2018-12-03 DIAGNOSIS — B9620 Unspecified Escherichia coli [E. coli] as the cause of diseases classified elsewhere: Secondary | ICD-10-CM | POA: Diagnosis present

## 2018-12-03 DIAGNOSIS — I10 Essential (primary) hypertension: Secondary | ICD-10-CM | POA: Diagnosis not present

## 2018-12-03 DIAGNOSIS — G8929 Other chronic pain: Secondary | ICD-10-CM | POA: Diagnosis not present

## 2018-12-03 DIAGNOSIS — F329 Major depressive disorder, single episode, unspecified: Secondary | ICD-10-CM | POA: Diagnosis not present

## 2018-12-03 DIAGNOSIS — F250 Schizoaffective disorder, bipolar type: Secondary | ICD-10-CM | POA: Diagnosis present

## 2018-12-03 DIAGNOSIS — Z7982 Long term (current) use of aspirin: Secondary | ICD-10-CM

## 2018-12-03 DIAGNOSIS — Z79899 Other long term (current) drug therapy: Secondary | ICD-10-CM

## 2018-12-03 LAB — POCT I-STAT 7, (LYTES, BLD GAS, ICA,H+H)
Acid-Base Excess: 9 mmol/L — ABNORMAL HIGH (ref 0.0–2.0)
Bicarbonate: 37.6 mmol/L — ABNORMAL HIGH (ref 20.0–28.0)
Calcium, Ion: 1.14 mmol/L — ABNORMAL LOW (ref 1.15–1.40)
HCT: 44 % (ref 36.0–46.0)
Hemoglobin: 15 g/dL (ref 12.0–15.0)
O2 Saturation: 100 %
Patient temperature: 97.9
Potassium: 3.8 mmol/L (ref 3.5–5.1)
Sodium: 118 mmol/L — CL (ref 135–145)
TCO2: 40 mmol/L — ABNORMAL HIGH (ref 22–32)
pCO2 arterial: 67.7 mmHg (ref 32.0–48.0)
pH, Arterial: 7.35 (ref 7.350–7.450)
pO2, Arterial: 235 mmHg — ABNORMAL HIGH (ref 83.0–108.0)

## 2018-12-03 LAB — POCT I-STAT EG7
Acid-Base Excess: 6 mmol/L — ABNORMAL HIGH (ref 0.0–2.0)
Acid-Base Excess: 7 mmol/L — ABNORMAL HIGH (ref 0.0–2.0)
Acid-Base Excess: 9 mmol/L — ABNORMAL HIGH (ref 0.0–2.0)
Bicarbonate: 35.5 mmol/L — ABNORMAL HIGH (ref 20.0–28.0)
Bicarbonate: 37.4 mmol/L — ABNORMAL HIGH (ref 20.0–28.0)
Bicarbonate: 36.9 mmol/L — ABNORMAL HIGH (ref 20.0–28.0)
Calcium, Ion: 0.93 mmol/L — ABNORMAL LOW (ref 1.15–1.40)
Calcium, Ion: 1.13 mmol/L — ABNORMAL LOW (ref 1.15–1.40)
Calcium, Ion: 1.02 mmol/L — ABNORMAL LOW (ref 1.15–1.40)
HCT: 48 % — ABNORMAL HIGH (ref 36.0–46.0)
HCT: 50 % — ABNORMAL HIGH (ref 36.0–46.0)
HCT: 48 % — ABNORMAL HIGH (ref 36.0–46.0)
Hemoglobin: 16.3 g/dL — ABNORMAL HIGH (ref 12.0–15.0)
Hemoglobin: 17 g/dL — ABNORMAL HIGH (ref 12.0–15.0)
Hemoglobin: 16.3 g/dL — ABNORMAL HIGH (ref 12.0–15.0)
O2 Saturation: 91 %
O2 Saturation: 70 %
O2 Saturation: 99 %
Potassium: 4.3 mmol/L (ref 3.5–5.1)
Potassium: 4.5 mmol/L (ref 3.5–5.1)
Potassium: 4.5 mmol/L (ref 3.5–5.1)
Sodium: 117 mmol/L — CL (ref 135–145)
Sodium: 117 mmol/L — CL (ref 135–145)
Sodium: 118 mmol/L — CL (ref 135–145)
TCO2: 38 mmol/L — ABNORMAL HIGH (ref 22–32)
TCO2: 40 mmol/L — ABNORMAL HIGH (ref 22–32)
TCO2: 39 mmol/L — ABNORMAL HIGH (ref 22–32)
pCO2, Ven: 70.8 mmHg (ref 44.0–60.0)
pCO2, Ven: 77.6 mmHg (ref 44.0–60.0)
pCO2, Ven: 62.2 mmHg — ABNORMAL HIGH (ref 44.0–60.0)
pH, Ven: 7.308 (ref 7.250–7.430)
pH, Ven: 7.291 (ref 7.250–7.430)
pH, Ven: 7.381 (ref 7.250–7.430)
pO2, Ven: 69 mmHg — ABNORMAL HIGH (ref 32.0–45.0)
pO2, Ven: 43 mmHg (ref 32.0–45.0)
pO2, Ven: 123 mmHg — ABNORMAL HIGH (ref 32.0–45.0)

## 2018-12-03 LAB — CBC WITH DIFFERENTIAL/PLATELET
Abs Immature Granulocytes: 0.1 10*3/uL — ABNORMAL HIGH (ref 0.00–0.07)
Basophils Absolute: 0 10*3/uL (ref 0.0–0.1)
Basophils Relative: 0 %
Eosinophils Absolute: 0 10*3/uL (ref 0.0–0.5)
Eosinophils Relative: 0 %
HCT: 41.1 % (ref 36.0–46.0)
Hemoglobin: 13.8 g/dL (ref 12.0–15.0)
Immature Granulocytes: 2 %
Lymphocytes Relative: 11 %
Lymphs Abs: 0.6 10*3/uL — ABNORMAL LOW (ref 0.7–4.0)
MCH: 30.9 pg (ref 26.0–34.0)
MCHC: 33.6 g/dL (ref 30.0–36.0)
MCV: 92.2 fL (ref 80.0–100.0)
Monocytes Absolute: 0.4 10*3/uL (ref 0.1–1.0)
Monocytes Relative: 8 %
Neutro Abs: 4.2 10*3/uL (ref 1.7–7.7)
Neutrophils Relative %: 79 %
Platelets: 153 10*3/uL (ref 150–400)
RBC: 4.46 MIL/uL (ref 3.87–5.11)
RDW: 12.6 % (ref 11.5–15.5)
WBC: 5.3 10*3/uL (ref 4.0–10.5)
nRBC: 0 % (ref 0.0–0.2)

## 2018-12-03 LAB — COMPREHENSIVE METABOLIC PANEL
ALT: 10 U/L (ref 0–44)
AST: 15 U/L (ref 15–41)
Albumin: 2.6 g/dL — ABNORMAL LOW (ref 3.5–5.0)
Alkaline Phosphatase: 63 U/L (ref 38–126)
Anion gap: 10 (ref 5–15)
BUN: 5 mg/dL — ABNORMAL LOW (ref 6–20)
CO2: 30 mmol/L (ref 22–32)
Calcium: 8.3 mg/dL — ABNORMAL LOW (ref 8.9–10.3)
Chloride: 78 mmol/L — ABNORMAL LOW (ref 98–111)
Creatinine, Ser: 0.47 mg/dL (ref 0.44–1.00)
GFR calc Af Amer: >60 mL/min (ref >60)
GFR calc non Af Amer: >60 mL/min (ref >60)
Glucose, Bld: 135 mg/dL — ABNORMAL HIGH (ref 70–99)
Potassium: 4.3 mmol/L (ref 3.5–5.1)
Sodium: 118 mmol/L — CL (ref 135–145)
Total Bilirubin: 0.5 mg/dL (ref 0.3–1.2)
Total Protein: 6.1 g/dL — ABNORMAL LOW (ref 6.5–8.1)

## 2018-12-03 LAB — URINALYSIS, ROUTINE W REFLEX MICROSCOPIC
Bilirubin Urine: NEGATIVE
Glucose, UA: NEGATIVE mg/dL
Hgb urine dipstick: NEGATIVE
Ketones, ur: NEGATIVE mg/dL
Nitrite: NEGATIVE
Protein, ur: NEGATIVE mg/dL
Specific Gravity, Urine: 1.008 (ref 1.005–1.030)
pH: 6 (ref 5.0–8.0)

## 2018-12-03 LAB — TRIGLYCERIDES: Triglycerides: 111 mg/dL (ref <150)

## 2018-12-03 LAB — RAPID URINE DRUG SCREEN, HOSP PERFORMED
Amphetamines: NOT DETECTED
Barbiturates: NOT DETECTED
Benzodiazepines: POSITIVE — AB
Cocaine: NOT DETECTED
Opiates: NOT DETECTED
Tetrahydrocannabinol: NOT DETECTED

## 2018-12-03 LAB — MRSA PCR SCREENING: MRSA by PCR: POSITIVE — AB

## 2018-12-03 LAB — GLUCOSE, CAPILLARY
Glucose-Capillary: 123 mg/dL — ABNORMAL HIGH (ref 70–99)
Glucose-Capillary: 68 mg/dL — ABNORMAL LOW (ref 70–99)

## 2018-12-03 LAB — BRAIN NATRIURETIC PEPTIDE: B Natriuretic Peptide: 296.6 pg/mL — ABNORMAL HIGH (ref 0.0–100.0)

## 2018-12-03 LAB — SARS CORONAVIRUS 2: SARS Coronavirus 2: NOT DETECTED

## 2018-12-03 LAB — OSMOLALITY, URINE: Osmolality, Ur: 250 mOsm/kg — ABNORMAL LOW (ref 300–900)

## 2018-12-03 LAB — TROPONIN I: Troponin I: <0.03 ng/mL (ref <0.03)

## 2018-12-03 LAB — SODIUM, URINE, RANDOM: Sodium, Ur: 36 mmol/L

## 2018-12-03 MED ORDER — VANCOMYCIN HCL 10 G IV SOLR
1250.0000 mg | INTRAVENOUS | Status: DC
  Administered 2018-12-04: 1250 mg via INTRAVENOUS
  Filled 2018-12-03 (×2): qty 1250

## 2018-12-03 MED ORDER — VANCOMYCIN HCL 10 G IV SOLR
1500.0000 mg | Freq: Once | INTRAVENOUS | Status: AC
  Administered 2018-12-03: 1500 mg via INTRAVENOUS
  Filled 2018-12-03: qty 1500

## 2018-12-03 MED ORDER — BUDESONIDE 0.5 MG/2ML IN SUSP
0.5000 mg | Freq: Two times a day (BID) | RESPIRATORY_TRACT | Status: DC
  Administered 2018-12-04 – 2018-12-18 (×28): 0.5 mg via RESPIRATORY_TRACT
  Filled 2018-12-03 (×29): qty 2

## 2018-12-03 MED ORDER — MIDAZOLAM HCL 2 MG/2ML IJ SOLN
1.0000 mg | Freq: Once | INTRAMUSCULAR | Status: AC
  Administered 2018-12-03: 1 mg via INTRAVENOUS

## 2018-12-03 MED ORDER — IPRATROPIUM BROMIDE 0.02 % IN SOLN
0.5000 mg | Freq: Four times a day (QID) | RESPIRATORY_TRACT | Status: DC
  Administered 2018-12-03 – 2018-12-05 (×6): 0.5 mg via RESPIRATORY_TRACT
  Filled 2018-12-03 (×5): qty 2.5

## 2018-12-03 MED ORDER — FENTANYL BOLUS VIA INFUSION
50.0000 ug | INTRAVENOUS | Status: DC | PRN
  Administered 2018-12-03 – 2018-12-04 (×7): 50 ug via INTRAVENOUS
  Filled 2018-12-03: qty 50

## 2018-12-03 MED ORDER — FENTANYL CITRATE (PF) 100 MCG/2ML IJ SOLN: 50.0000 ug | Freq: Once | INTRAMUSCULAR | Status: DC

## 2018-12-03 MED ORDER — DEXTROSE 50 % IV SOLN
12.5000 g | INTRAVENOUS | Status: AC
  Administered 2018-12-03: 21:00:00 12.5 g via INTRAVENOUS

## 2018-12-03 MED ORDER — NITROGLYCERIN IN D5W 200-5 MCG/ML-% IV SOLN
0.0000 ug/min | INTRAVENOUS | Status: DC
  Administered 2018-12-03: 12:00:00 5 ug/min via INTRAVENOUS
  Filled 2018-12-03: qty 250

## 2018-12-03 MED ORDER — MIDAZOLAM HCL 2 MG/2ML IJ SOLN: 2.0000 mg | INTRAMUSCULAR | Status: DC | PRN

## 2018-12-03 MED ORDER — SUCCINYLCHOLINE CHLORIDE 20 MG/ML IJ SOLN
150.0000 mg | Freq: Once | INTRAMUSCULAR | Status: AC
  Administered 2018-12-03: 15:00:00 150 mg via INTRAVENOUS
  Filled 2018-12-03: qty 7.5

## 2018-12-03 MED ORDER — HEPARIN SODIUM (PORCINE) 5000 UNIT/ML IJ SOLN
5000.0000 [IU] | Freq: Three times a day (TID) | INTRAMUSCULAR | Status: DC
  Administered 2018-12-03 – 2018-12-18 (×44): 5000 [IU] via SUBCUTANEOUS
  Filled 2018-12-03 (×47): qty 1

## 2018-12-03 MED ORDER — NITROGLYCERIN PEDIATRIC IV INFUSION 100 MCG/ML: 0.2500 ug/kg/min | INTRAVENOUS | Status: DC

## 2018-12-03 MED ORDER — ARFORMOTEROL TARTRATE 15 MCG/2ML IN NEBU
15.0000 ug | INHALATION_SOLUTION | Freq: Two times a day (BID) | RESPIRATORY_TRACT | Status: DC
  Administered 2018-12-04 – 2018-12-18 (×28): 15 ug via RESPIRATORY_TRACT
  Filled 2018-12-03 (×28): qty 2

## 2018-12-03 MED ORDER — FENTANYL CITRATE (PF) 100 MCG/2ML IJ SOLN
100.0000 ug | INTRAMUSCULAR | Status: DC | PRN
  Filled 2018-12-03: qty 2

## 2018-12-03 MED ORDER — PRAVASTATIN SODIUM 10 MG PO TABS
20.0000 mg | ORAL_TABLET | Freq: Every day | ORAL | Status: DC
  Administered 2018-12-03 – 2018-12-18 (×16): 20 mg
  Filled 2018-12-03 (×17): qty 2

## 2018-12-03 MED ORDER — MIDAZOLAM HCL 2 MG/2ML IJ SOLN
INTRAMUSCULAR | Status: AC
  Filled 2018-12-03: qty 2

## 2018-12-03 MED ORDER — DEXTROSE 50 % IV SOLN
INTRAVENOUS | Status: AC
  Filled 2018-12-03: qty 50

## 2018-12-03 MED ORDER — ETOMIDATE 2 MG/ML IV SOLN
20.0000 mg | Freq: Once | INTRAVENOUS | Status: AC
  Administered 2018-12-03: 20 mg via INTRAVENOUS

## 2018-12-03 MED ORDER — FENTANYL CITRATE (PF) 100 MCG/2ML IJ SOLN
100.0000 ug | INTRAMUSCULAR | Status: DC | PRN
  Administered 2018-12-03: 15:00:00 100 ug via INTRAVENOUS
  Filled 2018-12-03: qty 2

## 2018-12-03 MED ORDER — IPRATROPIUM BROMIDE 0.02 % IN SOLN
RESPIRATORY_TRACT | Status: AC
  Administered 2018-12-03: 0.5 mg via RESPIRATORY_TRACT
  Filled 2018-12-03: qty 2.5

## 2018-12-03 MED ORDER — ORAL CARE MOUTH RINSE
15.0000 mL | OROMUCOSAL | Status: DC
  Administered 2018-12-03 – 2018-12-04 (×10): 15 mL via OROMUCOSAL

## 2018-12-03 MED ORDER — FUROSEMIDE 10 MG/ML IJ SOLN
40.0000 mg | INTRAMUSCULAR | Status: AC
  Administered 2018-12-03: 40 mg via INTRAVENOUS
  Filled 2018-12-03: qty 4

## 2018-12-03 MED ORDER — ASPIRIN 81 MG PO CHEW
81.0000 mg | CHEWABLE_TABLET | Freq: Every day | ORAL | Status: DC
  Administered 2018-12-04 – 2018-12-11 (×8): 81 mg
  Filled 2018-12-03 (×8): qty 1

## 2018-12-03 MED ORDER — VALPROATE SODIUM 500 MG/5ML IV SOLN
500.0000 mg | Freq: Two times a day (BID) | INTRAVENOUS | Status: DC
  Administered 2018-12-03 – 2018-12-04 (×3): 500 mg via INTRAVENOUS
  Filled 2018-12-03 (×7): qty 5

## 2018-12-03 MED ORDER — ONDANSETRON HCL 4 MG/2ML IJ SOLN
4.0000 mg | Freq: Once | INTRAMUSCULAR | Status: AC
  Administered 2018-12-03: 4 mg via INTRAVENOUS
  Filled 2018-12-03: qty 2

## 2018-12-03 MED ORDER — PANTOPRAZOLE SODIUM 40 MG IV SOLR
40.0000 mg | Freq: Every day | INTRAVENOUS | Status: DC
  Administered 2018-12-03 – 2018-12-04 (×2): 40 mg via INTRAVENOUS
  Filled 2018-12-03 (×2): qty 40

## 2018-12-03 MED ORDER — SODIUM CHLORIDE 0.9 % IV SOLN
2.0000 g | Freq: Three times a day (TID) | INTRAVENOUS | Status: DC
  Administered 2018-12-03 – 2018-12-05 (×6): 2 g via INTRAVENOUS
  Filled 2018-12-03 (×6): qty 2

## 2018-12-03 MED ORDER — PROPOFOL 1000 MG/100ML IV EMUL
0.0000 ug/kg/min | INTRAVENOUS | Status: DC
  Administered 2018-12-03: 15:00:00 5 ug/kg/min via INTRAVENOUS
  Filled 2018-12-03: qty 100

## 2018-12-03 MED ORDER — CHLORHEXIDINE GLUCONATE CLOTH 2 % EX PADS
6.0000 | MEDICATED_PAD | Freq: Every day | CUTANEOUS | Status: AC
  Administered 2018-12-04 – 2018-12-08 (×5): 6 via TOPICAL

## 2018-12-03 MED ORDER — MUPIROCIN 2 % EX OINT
1.0000 "application " | TOPICAL_OINTMENT | Freq: Two times a day (BID) | CUTANEOUS | Status: AC
  Administered 2018-12-03 – 2018-12-08 (×10): 1 via NASAL
  Filled 2018-12-03 (×3): qty 22

## 2018-12-03 MED ORDER — CHLORHEXIDINE GLUCONATE 0.12% ORAL RINSE (MEDLINE KIT)
15.0000 mL | Freq: Two times a day (BID) | OROMUCOSAL | Status: DC
  Administered 2018-12-03 – 2018-12-04 (×2): 15 mL via OROMUCOSAL

## 2018-12-03 MED ORDER — FENTANYL CITRATE (PF) 100 MCG/2ML IJ SOLN
100.0000 ug | Freq: Once | INTRAMUSCULAR | Status: AC
  Administered 2018-12-03: 100 ug via INTRAVENOUS

## 2018-12-03 MED ORDER — CALCIUM GLUCONATE-NACL 1-0.675 GM/50ML-% IV SOLN
1.0000 g | Freq: Once | INTRAVENOUS | Status: AC
  Administered 2018-12-03: 1000 mg via INTRAVENOUS
  Filled 2018-12-03: qty 50

## 2018-12-03 MED ORDER — FENTANYL 2500MCG IN NS 250ML (10MCG/ML) PREMIX INFUSION
50.0000 ug/h | INTRAVENOUS | Status: DC
  Administered 2018-12-03: 18:00:00 50 ug/h via INTRAVENOUS
  Filled 2018-12-03: qty 250

## 2018-12-03 NOTE — ED Notes (Signed)
Dr. Rex Kras aware of pts BP and Heart rate, advised to come down on propofol and possibly hold and do pushes of fentanyl. May need to use soft wrist restraints as pt keeps moving hands towards ETT.

## 2018-12-03 NOTE — Progress Notes (Signed)
RN updated.  Please reconsult if future social work needs arise.  CSW signing off, as social work intervention is no longer needed.  Alphonse Guild. Alianah Lofton, LCSW, LCAS, CSI Transitions of Care Clinical Social Worker Care Coordination Department Ph: [phone]

## 2018-12-03 NOTE — ED Notes (Signed)
Pt will respond to voice, will not open her eyes, pt responded with her name but it was difficult to understand. Pt seems lethargic. Will move her hand when asked, but does so slowly.

## 2018-12-03 NOTE — ED Triage Notes (Signed)
Per [facility]: Did chest x-ray yesterday showed pulmonary edema with CHF. No fever at facility. Had nasal cannula off and was 60% on room air when [facility] arrived. Pt increased to 96% on 10 L NRB. Rales throughout and wheezing in upper lobes bilaterally. Still on NRB.  20 G left wrist/forearm.  Given 1 nitro for pulmonary edema and high BP 220/110, after nitro decreased to 168/92.

## 2018-12-03 NOTE — Progress Notes (Signed)
Hypoglycemic Event  CBG: 68  Treatment: D50 25 mL (12.5 gm)  Symptoms: None  Follow-up CBG: Time:2121 CBG Result:123  Possible Reasons for Event: Inadequate meal intake  Comments/MD notified:    Levonne Hubert

## 2018-12-03 NOTE — ED Notes (Signed)
Irena Cords PA aware of pts sodium level.

## 2018-12-03 NOTE — Progress Notes (Signed)
CSW received a call back from pt's guardian Terressa Koyanagi at ph: [phone] who stated she was standing by should a decision needed to be made.  CSW will continue to follow for D/C needs.  Alphonse Guild. Kylynn Street, LCSW, LCAS, CSI Transitions of Care Clinical Social Worker Care Coordination Department Ph: [phone]

## 2018-12-03 NOTE — Progress Notes (Signed)
Patient transported on vent from ED to 5T-27 without complication.  Report given to receiving RT.

## 2018-12-03 NOTE — ED Notes (Signed)
ED TO INPATIENT HANDOFF REPORT  ED Nurse Name and Phone #: Ardelle Park [phone]  S Name/Age/Gender Natalie Thomas 58 y.o. female Room/Bed: 028C/028C  Code Status   Code Status: Full Code  Home/SNF/Other Nursing Home Patient oriented to: self Is this baseline? No   Triage Complete: Triage complete  Chief Complaint Pulminary Edema  Triage Note Per [facility]: Did chest x-ray yesterday showed pulmonary edema with CHF. No fever at facility. Had nasal cannula off and was 60% on room air when [facility] arrived. Pt increased to 96% on 10 L NRB. Rales throughout and wheezing in upper lobes bilaterally. Still on NRB.  20 G left wrist/forearm.  Given 1 nitro for pulmonary edema and high BP 220/110, after nitro decreased to 168/92.    Allergies Allergies  Allergen Reactions  . Clarithromycin Itching  . Penicillins Itching    Has tolerated cefazolin before  Has patient had a PCN reaction causing immediate rash, facial/tongue/throat swelling, SOB or lightheadedness with hypotension: No Has patient had a PCN reaction causing severe rash involving mucus membranes or skin necrosis: No Has patient had a PCN reaction that required hospitalization: Unknown Has patient had a PCN reaction occurring within the last 10 years: No If all of the above answers are "NO", then may proceed with Cephalosporin use.     Level of Care/Admitting Diagnosis ED Disposition    ED Disposition Condition [facility] Area: [place] [[id number]]  Level of Care: ICU [6]  Covid Evaluation: Person Under Investigation (PUI)  Isolation Risk Level: High Risk/Airborne (Aerosolizing procedure, nebulizer, intubated/ventilation, CPAP/BiPAP)  Diagnosis: Acute hypoxemic respiratory failure [facility]) [[id number]]  Admitting Physician: Audria Nine [[id number]]  Attending Physician: PCCM, MD [phone]  Estimated length of stay: 5 - 7 days  Certification:: I certify this patient will need inpatient  services for at least 2 midnights  PT Class (Do Not Modify): Inpatient [101]  PT Acc Code (Do Not Modify): Private [1]       B Medical/Surgery History Past Medical History:  Diagnosis Date  . Acute on chronic respiratory failure with hypoxia and hypercapnia ([place]) 10/07/2017  . Anemia 10/16/2017  . Anxiety and depression   . CHF (congestive heart failure) ([place])   . Chronic hyponatremia 04/08/2007   Qualifier: Diagnosis of  By: Marca Ancona RMA, Lucy    . Chronic respiratory failure with hypoxia ([place]) 10/07/2017  . Closed comminuted intertrochanteric fracture of right femur ([place]) 11/07/2017  . COPD 04/08/2007   Qualifier: Diagnosis of  By: Marca Ancona RMA, Lucy    . Cough 05/08/2011  . Depression   . Diabetes mellitus, type 2 ([place])    pt denies but states that she has been treated for DM  . Dyspnea 10/16/2017  . Dysuria 04/08/2007   Qualifier: Diagnosis of  By: Reatha Armour, Lucy    . Essential hypertension 04/08/2007   Qualifier: Diagnosis of  By: Marca Ancona RMA, Lucy    . GERD (gastroesophageal reflux disease)   . HIP PAIN, LEFT 12/12/2007   Qualifier: Diagnosis of  By: Loanne Drilling MD, Jacelyn Pi   . Hyperlipidemia 10/07/2017  . Hypertension   . Hyponatremia 07/27/2011  . Incontinence of urine 08/14/2011  . Lumbar disc disease 04/09/2011  . MENOPAUSE, PREMATURE 04/08/2007   Qualifier: Diagnosis of  By: Marca Ancona RMA, Lucy    . 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 Osteoporosis   . Osteoporosis 04/08/2007   Qualifier: Diagnosis of  By: Larose Kells    . Polycythemia secondary to smoking 04/09/2011  . S/P right hip fracture 11/07/2017  . Schizoaffective disorder, bipolar type ([place]) 07/05/2011   Past Surgical History:  Procedure Laterality Date  . EYE SURGERY     on left  eye, pt states that she sees bad out of the right eye and needs surgery there  . INTRAMEDULLARY (IM) NAIL INTERTROCHANTERIC Right 11/13/2017   Procedure: INTRAMEDULLARY (IM) NAIL  INTERTROCHANTRIC;  Surgeon: Shona Needles, MD;  Location: [facility];  Service: Orthopedics;  Laterality: Right;  . PELVIC FRACTURE SURGERY       A IV Location/Drains/Wounds Patient Lines/Drains/Airways Status   Active Line/Drains/Airways    Name:   Placement date:   Placement time:   Site:   Days:   Peripheral IV 12/03/18 Left Wrist   12/03/18    -    Wrist   less than 1   Peripheral IV 12/03/18 Left Antecubital   12/03/18    1502    Antecubital   less than 1   Peripheral IV 12/03/18 Right Hand   12/03/18    1534    Hand   less than 1   NG/OG Tube Orogastric 14 Fr. Left mouth Xray;Aucultation   12/03/18    1456    Left mouth   less than 1   External Urinary Catheter   12/03/18    1400    -   less than 1   Airway 7.5 mm   12/03/18    1446     less than 1          Intake/Output Last 24 hours No intake or output data in the 24 hours ending 12/03/18 1635  Labs/Imaging Results for orders placed or performed during the hospital encounter of 12/03/18 (from the past 48 hour(s))  Comprehensive metabolic panel     Status: Abnormal   Collection Time: 12/03/18  9:57 AM  Result Value Ref Range   Sodium 118 (LL) 135 - 145 mmol/L    Comment: CRITICAL RESULT CALLED TO, READ BACK BY AND VERIFIED WITH: LINDSAY Lisle Skillman,RN AT 1124 12/03/2018 BY ZBEECH.    Potassium 4.3 3.5 - 5.1 mmol/L   Chloride 78 (L) 98 - 111 mmol/L   CO2 30 22 - 32 mmol/L   Glucose, Bld 135 (H) 70 - 99 mg/dL   BUN 5 (L) 6 - 20 mg/dL   Creatinine, Ser 0.47 0.44 - 1.00 mg/dL   Calcium 8.3 (L) 8.9 - 10.3 mg/dL   Total Protein 6.1 (L) 6.5 - 8.1 g/dL   Albumin 2.6 (L) 3.5 - 5.0 g/dL   AST 15 15 - 41 U/L   ALT 10 0 - 44 U/L   Alkaline Phosphatase 63 38 - 126 U/L   Total Bilirubin 0.5 0.3 - 1.2 mg/dL   GFR calc non Af Amer >60 >60 mL/min   GFR calc Af Amer >60 >60 mL/min   Anion gap 10 5 - 15    Comment: Performed at [facility], [place] [address] [place], [place] [address]  CBC with Differential     Status: Abnormal    Collection Time: 12/03/18  9:57 AM  Result Value Ref Range   WBC 5.3 4.0 - 10.5 K/uL   RBC 4.46 3.87 - 5.11 MIL/uL   Hemoglobin 13.8 12.0 - 15.0 g/dL   HCT 41.1 36.0 - 46.0 %   MCV 92.2 80.0 - 100.0 fL   MCH 30.9 26.0 - 34.0 pg  MCHC 33.6 30.0 - 36.0 g/dL   RDW 12.6 11.5 - 15.5 %   Platelets 153 150 - 400 K/uL   nRBC 0.0 0.0 - 0.2 %   Neutrophils Relative % 79 %   Neutro Abs 4.2 1.7 - 7.7 K/uL   Lymphocytes Relative 11 %   Lymphs Abs 0.6 (L) 0.7 - 4.0 K/uL   Monocytes Relative 8 %   Monocytes Absolute 0.4 0.1 - 1.0 K/uL   Eosinophils Relative 0 %   Eosinophils Absolute 0.0 0.0 - 0.5 K/uL   Basophils Relative 0 %   Basophils Absolute 0.0 0.0 - 0.1 K/uL   Immature Granulocytes 2 %   Abs Immature Granulocytes 0.10 (H) 0.00 - 0.07 K/uL    Comment: Performed at [facility] [address] [place], [place] [address]  Brain natriuretic peptide     Status: Abnormal   Collection Time: 12/03/18  9:57 AM  Result Value Ref Range   B Natriuretic Peptide 296.6 (H) 0.0 - 100.0 pg/mL    Comment: Performed at [facility] [address], [place], [place] [address]  POCT I-Stat EG7     Status: Abnormal   Collection Time: 12/03/18 10:22 AM  Result Value Ref Range   pH, Ven 7.308 7.250 - 7.430   pCO2, Ven 70.8 (HH) 44.0 - 60.0 mmHg   pO2, Ven 69.0 (H) 32.0 - 45.0 mmHg   Bicarbonate 35.5 (H) 20.0 - 28.0 mmol/L   TCO2 38 (H) 22 - 32 mmol/L   O2 Saturation 91.0 %   Acid-Base Excess 6.0 (H) 0.0 - 2.0 mmol/L   Sodium 117 (LL) 135 - 145 mmol/L   Potassium 4.3 3.5 - 5.1 mmol/L   Calcium, Ion 0.93 (L) 1.15 - 1.40 mmol/L   HCT 48.0 (H) 36.0 - 46.0 %   Hemoglobin 16.3 (H) 12.0 - 15.0 g/dL   Patient temperature HIDE    Sample type VENOUS    Comment NOTIFIED PHYSICIAN   POCT I-Stat EG7     Status: Abnormal   Collection Time: 12/03/18  2:20 PM  Result Value Ref Range   pH, Ven 7.291 7.250 - 7.430   pCO2, Ven 77.6 (HH) 44.0 - 60.0 mmHg   pO2, Ven 43.0 32.0 - 45.0 mmHg    Bicarbonate 37.4 (H) 20.0 - 28.0 mmol/L   TCO2 40 (H) 22 - 32 mmol/L   O2 Saturation 70.0 %   Acid-Base Excess 7.0 (H) 0.0 - 2.0 mmol/L   Sodium 117 (LL) 135 - 145 mmol/L   Potassium 4.5 3.5 - 5.1 mmol/L   Calcium, Ion 1.13 (L) 1.15 - 1.40 mmol/L   HCT 50.0 (H) 36.0 - 46.0 %   Hemoglobin 17.0 (H) 12.0 - 15.0 g/dL   Patient temperature HIDE    Sample type VENOUS    Comment NOTIFIED PHYSICIAN   I-STAT 7, (LYTES, BLD GAS, ICA, H+H)     Status: Abnormal   Collection Time: 12/03/18  4:30 PM  Result Value Ref Range   pH, Arterial 7.350 7.350 - 7.450   pCO2 arterial 67.7 (HH) 32.0 - 48.0 mmHg   pO2, Arterial 235.0 (H) 83.0 - 108.0 mmHg   Bicarbonate 37.6 (H) 20.0 - 28.0 mmol/L   TCO2 40 (H) 22 - 32 mmol/L   O2 Saturation 100.0 %   Acid-Base Excess 9.0 (H) 0.0 - 2.0 mmol/L   Sodium 118 (LL) 135 - 145 mmol/L   Potassium 3.8 3.5 - 5.1 mmol/L   Calcium, Ion 1.14 (L) 1.15 -  1.40 mmol/L   HCT 44.0 36.0 - 46.0 %   Hemoglobin 15.0 12.0 - 15.0 g/dL   Patient temperature 97.9 F    Collection site RADIAL, ALLEN'S TEST ACCEPTABLE    Drawn by RT    Sample type ARTERIAL    Comment NOTIFIED PHYSICIAN    Dg Chest Portable 1 View  Result Date: 12/03/2018 CLINICAL DATA:  Hypoxia EXAM: PORTABLE CHEST 1 VIEW COMPARISON:  Nov 08, 2018 FINDINGS: Endotracheal tube tip is 1.2 cm above the carina. Nasogastric tube tip and side port are below the diaphragm. No pneumothorax. There is pleural effusion with airspace consolidation in much of the right middle and lower lobes. There is consolidation in the medial left base. There is cardiomegaly with pulmonary vascularity normal. There is aortic atherosclerosis. No adenopathy. No bone lesions. IMPRESSION: Tube positions as described without pneumothorax. Note that the endotracheal tube tip is near the carina; it may be prudent to consider withdrawing endotracheal tube 2 to 2.5 cm. Right pleural effusion with consolidation throughout much of the right middle and lower  lobes. There is also consolidation in the medial left base. There is stable cardiomegaly. Aortic Atherosclerosis (ICD10-I70.0). Electronically Signed   By: Lowella Grip III M.D.   On: 12/03/2018 15:47   Dg Chest Port 1 View  Result Date: 12/03/2018 CLINICAL DATA:  Oxygen desaturation. EXAM: PORTABLE CHEST 1 VIEW COMPARISON:  Single-view of the chest 11/08/2018. PA and lateral chest 07/04/2018 and 06/09/2018. CT chest 05/14/2018. FINDINGS: The lungs are emphysematous. There is marked cardiomegaly. Mild interstitial edema is present. Hazy opacities in the lung bases likely represent small effusions. No pneumothorax. Atherosclerosis noted. No acute bony abnormality. IMPRESSION: Cardiomegaly with mild interstitial edema and small pleural effusions. Atherosclerosis. Emphysema. Electronically Signed   By: Inge Rise M.D.   On: 12/03/2018 10:35    Pending Labs Unresulted Labs (From admission, onward)    Start     Ordered   12/04/18 0500  CBC  Tomorrow morning,   R     12/03/18 1624   12/04/18 6283  Basic metabolic panel  Tomorrow morning,   R     12/03/18 1624   12/04/18 0500  Blood gas, arterial  Tomorrow morning,   R     12/03/18 1624   12/04/18 0500  Magnesium  Tomorrow morning,   R     12/03/18 1624   12/04/18 0500  Phosphorus  Tomorrow morning,   R     12/03/18 1624   12/04/18 0500  Procalcitonin  Daily,   R     12/03/18 1627   12/03/18 1629  Valproic acid level  Once,   R     12/03/18 1629   12/03/18 1627  Urinalysis, Routine w reflex microscopic  Once,   R     12/03/18 1627   12/03/18 1627  Urine rapid drug screen (hosp performed)  ONCE - STAT,   R     12/03/18 1627   12/03/18 1626  Blood gas, arterial  Once,   R     12/03/18 1626   12/03/18 1622  HIV antibody (Routine Testing)  Once,   R     12/03/18 1624   12/03/18 1622  Culture, blood (routine x 2)  BLOOD CULTURE X 2,   R     12/03/18 1624   12/03/18 1622  Urine culture  Once,   R     12/03/18 1624   12/03/18 1622   Culture, respiratory (tracheal aspirate)  Once,  R     12/03/18 1624   12/03/18 1558  SARS Coronavirus 2 (CEPHEID- Performed in [place] hospital lab), Hosp Order  (Symptomatic Patients Labs with Precautions )  Once,   R     12/03/18 1557   12/03/18 1457  Triglycerides  (propofol (DIPRIVAN))  Every 72 hours,   R    Comments:  While on propofol (DIPRIVAN)    12/03/18 1456   12/03/18 1019  Novel Coronavirus,NAA,(SEND-OUT TO REF LAB - TAT 24-48 hrs); Hosp Order  (Asymptomatic Patients Labs)  Once,   R    Question:  Rule Out  Answer:  Yes   12/03/18 1018   12/03/18 1012  Troponin I - ONCE - STAT  ONCE - STAT,   STAT     12/03/18 1011          Vitals/Pain Today's Vitals   12/03/18 1545 12/03/18 1600 12/03/18 1615 12/03/18 1625  BP: (!) 115/52 (!) 136/55 (!) 153/44 (!) 114/45  Pulse: (!) 40 (!) 43 (!) 45 (!) 40  Resp: 18 18 12    Temp:      TempSrc:      SpO2: 100% 100% 100% 100%  Weight:      Height:      PainSc:        Isolation Precautions Droplet and Contact precautions  Medications Medications  midazolam (VERSED) 2 MG/2ML injection (has no administration in time range)  heparin injection 5,000 Units (has no administration in time range)  pantoprazole (PROTONIX) injection 40 mg (has no administration in time range)  fentaNYL 2575mcg in NS 215mL (44mcg/ml) infusion-PREMIX (has no administration in time range)  fentaNYL (SUBLIMAZE) bolus via infusion 50 mcg (has no administration in time range)  midazolam (VERSED) injection 2 mg (has no administration in time range)  midazolam (VERSED) injection 2 mg (has no administration in time range)  calcium gluconate 1 g/ 50 mL sodium chloride IVPB (has no administration in time range)  aspirin chewable tablet 81 mg (has no administration in time range)  valproate (DEPACON) 500 mg in dextrose 5 % 50 mL IVPB (has no administration in time range)  budesonide (PULMICORT) nebulizer solution 0.5 mg (has no administration in time range)   arformoterol (BROVANA) nebulizer solution 15 mcg (has no administration in time range)  ipratropium (ATROVENT) nebulizer solution 0.5 mg (has no administration in time range)  pravastatin (PRAVACHOL) tablet 20 mg (has no administration in time range)  furosemide (LASIX) injection 40 mg (40 mg Intravenous Given 12/03/18 1307)  etomidate (AMIDATE) injection 20 mg (20 mg Intravenous Given 12/03/18 1445)  succinylcholine (ANECTINE) injection 150 mg (150 mg Intravenous Given 12/03/18 1446)  ondansetron (ZOFRAN) injection 4 mg (4 mg Intravenous Given 12/03/18 1521)  midazolam (VERSED) injection 1 mg (1 mg Intravenous Given 12/03/18 1615)  fentaNYL (SUBLIMAZE) injection 100 mcg (100 mcg Intravenous Given 12/03/18 1615)    Mobility walks with device High fall risk   Focused Assessments Pulmonary Assessment Handoff:  Lung sounds: Bilateral Breath Sounds: Rhonchi, Diminished L Breath Sounds: Rhonchi R Breath Sounds: Rhonchi O2 Device: Ventilator O2 Flow Rate (L/min): 5 L/min      R Recommendations: See Admitting Provider Note  Report given to:   Additional Notes: Pt from [facility]. Pt has been confused in ED. Pt always wears oxygen, 2 l [place].

## 2018-12-03 NOTE — ED Provider Notes (Signed)
Procedure Name: Intubation Date/Time: 12/03/2018 3:27 PM Performed by: Sharlett Iles, MD Pre-anesthesia Checklist: Patient identified, Emergency Drugs available, Suction available, Patient being monitored and Timeout performed Oxygen Delivery Method: Non-rebreather mask Preoxygenation: Pre-oxygenation with 100% oxygen Induction Type: Rapid sequence Laryngoscope Size: Glidescope and 3 Grade View: Grade I Tube size: 7.5 mm Number of attempts: 1 Airway Equipment and Method: Video-laryngoscopy Placement Confirmation: ETT inserted through vocal cords under direct vision,  CO2 detector and Breath sounds checked- equal and bilateral Secured at: 24 cm Tube secured with: ETT holder Dental Injury: Teeth and Oropharynx as per pre-operative assessment  Future Recommendations: Recommend- induction with short-acting agent, and alternative techniques readily available         Little, Wenda Overland, MD 12/03/18 1528

## 2018-12-03 NOTE — Progress Notes (Signed)
Critical value from ABG given to Brent General RN.

## 2018-12-03 NOTE — ED Notes (Signed)
Bilateral soft wrist restraints applied using a slip knot. Secured to bed frame. PMS is intact. Skin is appropriate color. This RN can fit two fingers underneath the restraints.

## 2018-12-03 NOTE — Progress Notes (Signed)
CSW received a call from pt's RN requesting that CSW assist with contacting pt's legal guardian to update them regarding pt's medical condition and pt's location.  Per the notes, "pt's legal guardian is DSS social worker Terressa Koyanagi, [phone]; after-hours/emergency contact number is [phone]".  CSW called the DSS/APS after hours hotline to speak directly with a DSS worker and called pt's LG's office number to leave a VM a left a VM on her SECURE VM.   CSW awaiting return call from [place] on-call social worker.  CSW will continue to follow for D/C needs.  Alphonse Guild. Ernesta Trabert, LCSW, LCAS, CSI Transitions of Care Clinical Social Worker Care Coordination Department Ph: [phone]

## 2018-12-03 NOTE — H&P (Addendum)
NAME:  Natalie Thomas, MRN:  [id number], DOB:  March 06, 1961, LOS: 0 ADMISSION DATE:  12/03/2018, CONSULTATION DATE:  12/03/18 REFERRING MD:  Rex Kras CHIEF COMPLAINT:  SOB   Brief History   Natalie Thomas is a 58 y.o. female who resides at [facility], presented to ED 6/10 with dyspnea and hypoxic respiratory failure requiring intubation.  History of present illness   Pt is encephelopathic; therefore, this HPI is obtained from chart review. Natalie Thomas is a 58 y.o. female who has a PMH including but not limited to HTN, HLD, CHF, COPD, chronic hyponatremia, polycythemia, depression, schizoaffective disorder (see "past medical history" for rest).  She resides at [facility] and she presented to [facility] ED 6/10 with dyspnea and acute hypoxic respiratory failure (sats of 60% on RA).  She apparently had CXR at the facility which showed pulmonary edema.  In ED, she required intubation.  Per RN, she had moderate secretions during intubation.  She was initially hypertensive to 170's; however, SBP dropped to 90's with propofol.  HR also dropped down to as low as 40.  COVID was obtained; however, it was the send out lab.  Rapid COVID was added after PCCM evaluated in ED.  No known fevers, chills, sweats, chest pain, exposures to known sick contacts / COVID positive pts.  Past Medical History  HTN, HLD, CHF, COPD, chronic hyponatremia, polycythemia, DM, depression, schizoaffective disorder  Significant Hospital Events   6/10 > admit, intubated in ED.  Consults:  None.  Procedures:  ETT 6/10 >   Significant Diagnostic Tests:  CXR 6/10 > right effusion, bilateral basilar consolidation. Echo 6/10 >   Micro Data:  Blood 6/10 >  Sputum 6/10 >  Urine 6/10 >  SARS CoV2 6/10 >   Antimicrobials:  Vanc 6/10 >  Cefepime 6/10 >   Interim history/subjective:  Slightly agitated on vent.  Propofol turned down due to hypotension.  Objective:  Blood pressure (!)  115/52, pulse (!) 40, temperature 97.9 F (36.6 C), temperature source Oral, resp. rate 18, height 5\' 3"  (1.6 m), weight 83 kg, SpO2 100 %.    Vent Mode: PRVC FiO2 (%):  [100 %] 100 % Set Rate:  [18 bmp] 18 bmp Vt Set:  [420 mL] 420 mL PEEP:  [5 cmH20] 5 cmH20 Plateau Pressure:  [29 cmH20] 29 cmH20  No intake or output data in the 24 hours ending 12/03/18 1553 Filed Weights   12/03/18 1415  Weight: 83 kg    Examination (while awaiting COVID testing, this exam was performed from doorway and with the assistance of RN who had already examined the pt earlier ): General: Adult female, in NAD. Neuro: Sedated though mildly agitated.Marland Kitchen HEENT: [place]/AT. Sclerae anicteric.  ETT in place. Cardiovascular: Loletha Grayer, regular, no M/R/G.  Lungs: Respirations even and unlabored.  Faint basilar crackles. Abdomen: BS x 4, soft, NT/ND.  Musculoskeletal: No gross deformities, no edema.  Skin: Intact, warm, no rashes.  Assessment & Plan:   Acute hypoxic and hypercapnic respiratory failure - unclear etiology though some concern for HCAP in addition to underlying COPD and possible mild pulmonary edema. We will need to rule out COVID considering the fact that pt is a resident of a local nursing home facility. - Continue full vent support. - Daily SBT. - Bronchial hygiene. - Empiric vanc / cefepime for now and follow cultures. - Assess PCT, if low then consider d/c abx given no leukocytosis or fever (unknown whether she had symptoms). -  Assess rapid COVID testing. - Follow CXR.  Hypotension with bradycardia - due to propofol, was initially hypertensive in NSR. - D/c propofol and switch to fentanyl infusion.  Hypocalcemia. - 1g Ca gluconate.  Hx chronic hyponatremia - likely due to multiple psychotropic meds. - Follow BMP.  Hx HTN, HLD, CHF.  - Assess echo. - Continue preadmission ASA, pravastatin. - Hold preadmission carvedilol, furosemide, hydralazine, nifedipine.  Hx COPD. - Budesonide / Brovana  in lieu of preadmission breo, duonebs.  Hx depression, schizoaffective disorder. - Continue preadmission depakote and assess levels. - Hold preadmission asenapine, diazepam, trazodone, bupropion, lurasidone, paxil, gabapentin. - Pharmacy to please reconcile meds given multiple psychotropic meds on outpatient MAR.  Best Practice:  Diet: NPO. Pain/Anxiety/Delirium protocol (if indicated): fentanyl gtt / midazolam PRN.  RASS goal -1. VAP protocol (if indicated): In place. DVT prophylaxis: SCD's / Heparin. GI prophylaxis: PPI. Glucose control: None. Mobility: Bedrest. Code Status: Full. Family Communication: None available. Disposition: ICU.  Labs   CBC: Recent Labs  Lab 12/03/18 0957 12/03/18 1022 12/03/18 1420  WBC 5.3  --   --   NEUTROABS 4.2  --   --   HGB 13.8 16.3* 17.0*  HCT 41.1 48.0* 50.0*  MCV 92.2  --   --   PLT 153  --   --    Basic Metabolic Panel: Recent Labs  Lab 12/03/18 0957 12/03/18 1022 12/03/18 1420  NA 118* 117* 117*  K 4.3 4.3 4.5  CL 78*  --   --   CO2 30  --   --   GLUCOSE 135*  --   --   BUN 5*  --   --   CREATININE 0.47  --   --   CALCIUM 8.3*  --   --    GFR: Estimated Creatinine Clearance: 79.1 mL/min (by C-G formula based on SCr of 0.47 mg/dL). Recent Labs  Lab 12/03/18 0957  WBC 5.3   Liver Function Tests: Recent Labs  Lab 12/03/18 0957  AST 15  ALT 10  ALKPHOS 63  BILITOT 0.5  PROT 6.1*  ALBUMIN 2.6*   No results for input(s): LIPASE, AMYLASE in the last 168 hours. No results for input(s): AMMONIA in the last 168 hours. ABG    Component Value Date/Time   PHART 7.479 (H) 12/24/2017 1903   PCO2ART 46.1 12/24/2017 1903   PO2ART 84.0 12/24/2017 1903   HCO3 37.4 (H) 12/03/2018 1420   TCO2 40 (H) 12/03/2018 1420   O2SAT 70.0 12/03/2018 1420    Coagulation Profile: No results for input(s): INR, PROTIME in the last 168 hours. Cardiac Enzymes: No results for input(s): CKTOTAL, CKMB, CKMBINDEX, TROPONINI in the last 168  hours. HbA1C: Hemoglobin A1C  Date/Time Value Ref Range Status  06/02/2012 04:31 AM 6.3 4.2 - 6.3 % Final    Comment:    The American Diabetes Association recommends that a primary goal of therapy should be <7% and that physicians should reevaluate the treatment regimen in patients with HbA1c values consistently >8%.   10/31/2011 12:45 PM 5.2 4.2 - 6.3 % Final    Comment:    The American Diabetes Association recommends that a primary goal of therapy should be <7% and that physicians should reevaluate the treatment regimen in patients with HbA1c values consistently >8%.    Hgb A1c MFr Bld  Date/Time Value Ref Range Status  11/07/2017 08:50 AM 5.2 4.8 - 5.6 % Final    Comment:    (NOTE) Pre diabetes:  5.7%-6.4% Diabetes:              >6.4% Glycemic control for   <7.0% adults with diabetes    CBG: No results for input(s): GLUCAP in the last 168 hours.  Review of Systems:   Unable to obtain as pt is encephalopathic.  Past medical history  She,  has a past medical history of Acute on chronic respiratory failure with hypoxia and hypercapnia ([place]) (10/07/2017), Anemia (10/16/2017), Anxiety and depression, CHF (congestive heart failure) ([place]), Chronic hyponatremia (04/08/2007), Chronic respiratory failure with hypoxia ([place]) (10/07/2017), Closed comminuted intertrochanteric fracture of right femur ([place]) (11/07/2017), COPD (04/08/2007), Cough (05/08/2011), Depression, Diabetes mellitus, type 2 ([place]), Dyspnea (10/16/2017), Dysuria (04/08/2007), Essential hypertension (04/08/2007), GERD (gastroesophageal reflux disease), HIP PAIN, LEFT (12/12/2007), Hyperlipidemia (10/07/2017), Hypertension, Hyponatremia (07/27/2011), Incontinence of urine (08/14/2011), Lumbar disc disease (04/09/2011), MENOPAUSE, PREMATURE (04/08/2007), NEOPLASM, MALIGNANT, VULVA (04/08/2007), Osteoporosis, Osteoporosis (04/08/2007), Polycythemia secondary to smoking (04/09/2011), S/P right hip fracture (11/07/2017), and  Schizoaffective disorder, bipolar type ([place]) (07/05/2011).   Surgical History    Past Surgical History:  Procedure Laterality Date  . EYE SURGERY     on left  eye, pt states that she sees bad out of the right eye and needs surgery there  . INTRAMEDULLARY (IM) NAIL INTERTROCHANTERIC Right 11/13/2017   Procedure: INTRAMEDULLARY (IM) NAIL INTERTROCHANTRIC;  Surgeon: Shona Needles, MD;  Location: [place];  Service: Orthopedics;  Laterality: Right;  . PELVIC FRACTURE SURGERY       Social History   reports that she has been smoking cigarettes. She has been smoking about 0.25 packs per day. She has never used smokeless tobacco. She reports that she does not drink alcohol or use drugs.   Family history   Her family history is not on file.   Allergies Allergies  Allergen Reactions  . Clarithromycin Itching  . Penicillins Itching    Has tolerated cefazolin before  Has patient had a PCN reaction causing immediate rash, facial/tongue/throat swelling, SOB or lightheadedness with hypotension: No Has patient had a PCN reaction causing severe rash involving mucus membranes or skin necrosis: No Has patient had a PCN reaction that required hospitalization: Unknown Has patient had a PCN reaction occurring within the last 10 years: No If all of the above answers are "NO", then may proceed with Cephalosporin use.      Home meds  Prior to Admission medications   Medication Sig Start Date End Date Taking? Authorizing Provider  acetaminophen (TYLENOL) 325 MG tablet Take 650 mg by mouth 2 (two) times daily as needed (pain). Do not exceed 3 grams/24 hours of tylenol from all sources   Yes [provider]  acetaminophen (TYLENOL) 500 MG tablet Take 500 mg by mouth every 6 (six) hours as needed (fever 99.5-101 F). Dose not to exceed 2000 mg in 24 hours   Yes [provider]  albuterol (PROVENTIL HFA;VENTOLIN HFA) 108 (90 Base) MCG/ACT inhaler Inhale 2 puffs into the lungs every 4 (four)  hours as needed for wheezing or shortness of breath. Patient taking differently: Inhale 2 puffs into the lungs every 4 (four) hours as needed for shortness of breath.  07/05/17  Yes Ward, Delice Bison, DO  aluminum-magnesium hydroxide-simethicone (MAALOX) 200-200-20 MG/5ML SUSP Take 30 mLs by mouth 4 (four) times daily as needed (heartburn, indigestion).   Yes [provider]  asenapine (SAPHRIS) 5 MG SUBL 24 hr tablet Place 5 mg under the tongue 2 (two) times daily.   Yes [provider]  aspirin 81  MG tablet Take 1 tablet (81 mg total) by mouth daily. For heart health 08/06/11  Yes Greig Castilla, FNP  buPROPion (WELLBUTRIN XL) 150 MG 24 hr tablet Take 150 mg by mouth daily.   Yes [provider]  carvedilol (COREG) 25 MG tablet Take 25 mg by mouth 2 (two) times daily with a meal.   Yes [provider]  diazepam (VALIUM) 5 MG tablet Take 5 mg by mouth 2 (two) times a day.   Yes [provider]  dicyclomine (BENTYL) 20 MG tablet Take 1 tablet (20 mg total) by mouth 2 (two) times daily. Patient taking differently: Take 20 mg by mouth 2 (two) times daily as needed (abdominal pain).  11/04/17  Yes Recardo Evangelist, PA-C  divalproex (DEPAKOTE) 500 MG DR tablet Take 500 mg by mouth 2 (two) times daily.   Yes [provider]  docusate sodium (COLACE) 100 MG capsule Take 100 mg by mouth 2 (two) times daily as needed (constipation).    Yes [provider]  ferrous sulfate 325 (65 FE) MG tablet Take 325 mg by mouth daily.    Yes [provider]  fluticasone (FLONASE) 50 MCG/ACT nasal spray Place 2 sprays into both nostrils 2 (two) times daily.   Yes [provider]  fluticasone furoate-vilanterol (BREO ELLIPTA) 100-25 MCG/INH AEPB Inhale 1 puff into the lungs daily.   Yes [provider]  furosemide (LASIX) 20 MG tablet Take 20 mg by mouth 2 (two) times daily.   Yes [provider]  gabapentin (NEURONTIN) 300 MG  capsule Take 300 mg by mouth 2 (two) times daily.    Yes [provider]  guaifenesin (ROBAFEN) 100 MG/5ML syrup Take 200 mg by mouth 3 (three) times daily as needed for cough or congestion.   Yes [provider]  hydrALAZINE (APRESOLINE) 100 MG tablet Take 100 mg by mouth 3 (three) times daily.   Yes [provider]  hydrOXYzine (ATARAX/VISTARIL) 25 MG tablet Take 1 tablet (25 mg total) by mouth 3 (three) times daily as needed for anxiety. 10/19/17  Yes Aline August, MD  ipratropium-albuterol (DUONEB) 0.5-2.5 (3) MG/3ML SOLN Take 3 mLs by nebulization See admin instructions. Inhale one vial via hand held nebulizer three times daily, may also use every 8 hours as needed for shortness of breath   Yes [provider]  loperamide (IMODIUM A-D) 2 MG tablet Take 2 mg by mouth as needed for diarrhea or loose stools (not to exceed 8 doses in 24 hours).    Yes [provider]  loratadine (CLARITIN) 10 MG tablet Take 10 mg by mouth daily.   Yes [provider]  lurasidone (LATUDA) 40 MG TABS tablet Take 40 mg by mouth 2 (two) times a day.   Yes [provider]  magnesium oxide (MAG-OX) 400 (241.3 Mg) MG tablet Take 2 tablets (800 mg total) by mouth 2 (two) times daily. Patient taking differently: Take 400 mg by mouth daily.  08/12/17  Yes Nita Sells, MD  Melatonin 10 MG TABS Take 10 mg by mouth at bedtime.   Yes [provider]  neomycin-bacitracin-polymyxin (NEOSPORIN) ointment Apply 1 application topically daily as needed (minor skin tears or abrasions).    Yes [provider]  NIFEdipine (PROCARDIA XL/ADALAT-CC) 90 MG 24 hr tablet Take 90 mg by mouth daily.   Yes [provider]  omeprazole (PRILOSEC) 20 MG capsule Take 1 capsule (20 mg total) by mouth 2 (two) times daily before a  meal. For acid reflux. Patient taking differently: Take 20 mg by mouth daily. For acid reflux. 10/19/17  Yes Aline August, MD   oxyCODONE (OXY IR/ROXICODONE) 5 MG immediate release tablet Take 5 mg by mouth daily.    Yes [provider]  Loma [place] (OYSTER CALCIUM) 500 MG TABS tablet Take 500 mg of elemental calcium by mouth daily with breakfast.    Yes [provider]  PARoxetine (PAXIL) 30 MG tablet Take 30 mg by mouth daily.   Yes [provider]  polyethylene glycol (MIRALAX) packet Take 17 g by mouth daily as needed for severe constipation. 06/17/18  Yes Hayden Rasmussen, MD  pravastatin (PRAVACHOL) 20 MG tablet Take 20 mg by mouth daily.   Yes [provider]  Probiotic Product (PROBIOTIC PO) Take 1 capsule by mouth daily.    Yes [provider]  sodium chloride 1 g tablet Take 1 g by mouth 3 (three) times daily.   Yes [provider]  traZODone (DESYREL) 150 MG tablet Take 150 mg by mouth at bedtime.    Yes [provider]  buPROPion (WELLBUTRIN XL) 300 MG 24 hr tablet Take 1 tablet (300 mg total) by mouth every morning. Patient not taking: Reported on 11/08/2018 10/19/17 11/08/18  Aline August, MD  divalproex (DEPAKOTE) 250 MG DR tablet Take 3 tablets (750 mg total) by mouth every 12 (twelve) hours for 30 days. Patient not taking: Reported on 12/03/2018 11/11/18 12/11/18  Joy, Helane Gunther, PA-C  levofloxacin (LEVAQUIN) 750 MG tablet Take 750 mg by mouth daily. Started on 11-22-18 Ds 10    [provider]  lurasidone (LATUDA) 80 MG TABS tablet Take 1 tablet (80 mg total) by mouth daily with breakfast. Patient not taking: Reported on 11/08/2018 08/12/17   Nita Sells, MD  nystatin cream (MYCOSTATIN) Apply to affected area 2 times daily for 7 days Patient not taking: Reported on 12/03/2018 11/11/18   Joy, Helane Gunther, PA-C  PARoxetine (PAXIL) 20 MG tablet Take 1 tablet (20 mg total) by mouth daily. Patient not taking: Reported on 11/08/2018 10/19/17   Aline August, MD    Critical care time: 40 min.    Montey Hora, [facility] Pager: [phone].  If no answer, [phone] 12/03/2018, 5:03 PM

## 2018-12-03 NOTE — ED Notes (Signed)
Dr. Rex Kras, Tray NT, and RT X 2 at bedside

## 2018-12-03 NOTE — Progress Notes (Signed)
Pharmacy Antibiotic Note  Natalie Thomas is a 58 y.o. female admitted on 12/03/2018 with acute hypoxic respiratory failure, patient resides at local nursing home facility. WBC 5.3, Tmax 97.9, Scr 0.47 with CrCl estimated at 3mL/min, now intubated. Pharmacy consulted to dose vancomycin and cefepime in light of possible HCAP.  Plan: Vancomycin IV 1500mg  x1 loading dose Vancomycin IV 1250mg  q24hrs Estimated AUC 522 Cefepime 2g q8hrs Monitor renal function, LOT, culture data, vanc levels as needed.  Height: 5\' 3"  (160 cm) Weight: 182 lb 15.7 oz (83 kg) IBW/kg (Calculated) : 52.4  Temp (24hrs), Avg:97.9 F (36.6 C), Min:97.9 F (36.6 C), Max:97.9 F (36.6 C)  Recent Labs  Lab 12/03/18 0957  WBC 5.3  CREATININE 0.47    Estimated Creatinine Clearance: 79.1 mL/min (by C-G formula based on SCr of 0.47 mg/dL).    Allergies  Allergen Reactions  . Clarithromycin Itching  . Penicillins Itching    Has tolerated cefazolin before  Has patient had a PCN reaction causing immediate rash, facial/tongue/throat swelling, SOB or lightheadedness with hypotension: No Has patient had a PCN reaction causing severe rash involving mucus membranes or skin necrosis: No Has patient had a PCN reaction that required hospitalization: Unknown Has patient had a PCN reaction occurring within the last 10 years: No If all of the above answers are "NO", then may proceed with Cephalosporin use.     Antimicrobials this admission: Vancomycin 6/10 >> Cefepime 6/10 >>  Thank you for involving pharmacy in this patient's care.  Janae Bridgeman, PharmD PGY1 Pharmacy Resident Phone: [phone] 12/03/2018 5:42 PM

## 2018-12-03 NOTE — ED Notes (Signed)
Nurse Navigator communication: The patient has a legal guardian, I have contacted the LCSW on to help with contacting the guardian for information about the patients care. She is from [facility] and is currently intubated in the ER.

## 2018-12-04 ENCOUNTER — Inpatient Hospital Stay (HOSPITAL_COMMUNITY): Payer: Medicare Other

## 2018-12-04 DIAGNOSIS — J9601 Acute respiratory failure with hypoxia: Secondary | ICD-10-CM

## 2018-12-04 DIAGNOSIS — J189 Pneumonia, unspecified organism: Secondary | ICD-10-CM

## 2018-12-04 LAB — BLOOD GAS, ARTERIAL
Acid-Base Excess: 10 mmol/L — ABNORMAL HIGH (ref 0.0–2.0)
Bicarbonate: 35.2 mmol/L — ABNORMAL HIGH (ref 20.0–28.0)
FIO2: 0.6
MECHVT: 420 mL
O2 Saturation: 93.1 %
PEEP: 8 cmH2O
Patient temperature: 98.8
pCO2 arterial: 59.9 mmHg — ABNORMAL HIGH (ref 32.0–48.0)
pH, Arterial: 7.388 (ref 7.350–7.450)
pO2, Arterial: 67.7 mmHg — ABNORMAL LOW (ref 83.0–108.0)

## 2018-12-04 LAB — BASIC METABOLIC PANEL
Anion gap: 9 (ref 5–15)
BUN: 8 mg/dL (ref 6–20)
CO2: 29 mmol/L (ref 22–32)
Calcium: 8.1 mg/dL — ABNORMAL LOW (ref 8.9–10.3)
Chloride: 84 mmol/L — ABNORMAL LOW (ref 98–111)
Creatinine, Ser: 0.53 mg/dL (ref 0.44–1.00)
GFR calc Af Amer: >60 mL/min (ref >60)
GFR calc non Af Amer: >60 mL/min (ref >60)
Glucose, Bld: 93 mg/dL (ref 70–99)
Potassium: 3.9 mmol/L (ref 3.5–5.1)
Sodium: 122 mmol/L — ABNORMAL LOW (ref 135–145)

## 2018-12-04 LAB — CBC
HCT: 39.4 % (ref 36.0–46.0)
Hemoglobin: 13.4 g/dL (ref 12.0–15.0)
MCH: 30.9 pg (ref 26.0–34.0)
MCHC: 34 g/dL (ref 30.0–36.0)
MCV: 90.8 fL (ref 80.0–100.0)
Platelets: 123 10*3/uL — ABNORMAL LOW (ref 150–400)
RBC: 4.34 MIL/uL (ref 3.87–5.11)
RDW: 12.8 % (ref 11.5–15.5)
WBC: 6.4 10*3/uL (ref 4.0–10.5)
nRBC: 0 % (ref 0.0–0.2)

## 2018-12-04 LAB — VALPROIC ACID LEVEL: Valproic Acid Lvl: 35 ug/mL — ABNORMAL LOW (ref 50.0–100.0)

## 2018-12-04 LAB — PROCALCITONIN: Procalcitonin: <0.1 ng/mL

## 2018-12-04 LAB — HIV ANTIBODY (ROUTINE TESTING W REFLEX): HIV Screen 4th Generation wRfx: NONREACTIVE

## 2018-12-04 LAB — NOVEL CORONAVIRUS, NAA (HOSP ORDER, SEND-OUT TO REF LAB; TAT 18-24 HRS): SARS-CoV-2, NAA: NOT DETECTED

## 2018-12-04 LAB — GLUCOSE, CAPILLARY
Glucose-Capillary: 73 mg/dL (ref 70–99)
Glucose-Capillary: 119 mg/dL — ABNORMAL HIGH (ref 70–99)
Glucose-Capillary: 65 mg/dL — ABNORMAL LOW (ref 70–99)
Glucose-Capillary: 82 mg/dL (ref 70–99)
Glucose-Capillary: 76 mg/dL (ref 70–99)
Glucose-Capillary: 102 mg/dL — ABNORMAL HIGH (ref 70–99)
Glucose-Capillary: 89 mg/dL (ref 70–99)

## 2018-12-04 LAB — ECHOCARDIOGRAM COMPLETE
Height: 63 in
Weight: 3266.34 oz

## 2018-12-04 LAB — PHOSPHORUS: Phosphorus: 3.5 mg/dL (ref 2.5–4.6)

## 2018-12-04 LAB — MAGNESIUM: Magnesium: 1.5 mg/dL — ABNORMAL LOW (ref 1.7–2.4)

## 2018-12-04 LAB — OSMOLALITY: Osmolality: 247 mOsm/kg — CL (ref 275–295)

## 2018-12-04 MED ORDER — DEXTROSE 50 % IV SOLN
12.5000 g | INTRAVENOUS | Status: AC
  Administered 2018-12-04: 04:00:00 12.5 g via INTRAVENOUS

## 2018-12-04 MED ORDER — ORAL CARE MOUTH RINSE
15.0000 mL | Freq: Two times a day (BID) | OROMUCOSAL | Status: DC
  Administered 2018-12-04 – 2018-12-18 (×24): 15 mL via OROMUCOSAL

## 2018-12-04 MED ORDER — ASENAPINE MALEATE 5 MG SL SUBL
5.0000 mg | SUBLINGUAL_TABLET | Freq: Two times a day (BID) | SUBLINGUAL | Status: DC
  Administered 2018-12-04 – 2018-12-18 (×29): 5 mg via SUBLINGUAL
  Filled 2018-12-04 (×32): qty 1

## 2018-12-04 MED ORDER — LURASIDONE HCL 40 MG PO TABS
40.0000 mg | ORAL_TABLET | Freq: Two times a day (BID) | ORAL | Status: DC
  Administered 2018-12-04 – 2018-12-18 (×30): 40 mg via ORAL
  Filled 2018-12-04 (×34): qty 1

## 2018-12-04 MED ORDER — DEXTROSE 10 % IV SOLN
INTRAVENOUS | Status: DC
  Administered 2018-12-04: 05:00:00 via INTRAVENOUS

## 2018-12-04 MED ORDER — TRAZODONE HCL 150 MG PO TABS
150.0000 mg | ORAL_TABLET | Freq: Every day | ORAL | Status: DC
  Administered 2018-12-05 – 2018-12-07 (×3): 150 mg via ORAL
  Filled 2018-12-04 (×3): qty 1
  Filled 2018-12-04: qty 3

## 2018-12-04 MED ORDER — DIAZEPAM 5 MG PO TABS
5.0000 mg | ORAL_TABLET | Freq: Two times a day (BID) | ORAL | Status: DC
  Administered 2018-12-04 – 2018-12-06 (×5): 5 mg via ORAL
  Filled 2018-12-04 (×5): qty 1

## 2018-12-04 MED ORDER — MAGNESIUM SULFATE 2 GM/50ML IV SOLN
2.0000 g | Freq: Once | INTRAVENOUS | Status: AC
  Administered 2018-12-04: 11:00:00 2 g via INTRAVENOUS
  Filled 2018-12-04: qty 50

## 2018-12-04 MED ORDER — VITAL HIGH PROTEIN PO LIQD
1000.0000 mL | ORAL | Status: DC
  Administered 2018-12-04: 1000 mL

## 2018-12-04 MED ORDER — DEXTROSE 50 % IV SOLN
INTRAVENOUS | Status: AC
  Administered 2018-12-04: 12.5 g via INTRAVENOUS
  Filled 2018-12-04: qty 50

## 2018-12-04 MED ORDER — FUROSEMIDE 10 MG/ML IJ SOLN
INTRAMUSCULAR | Status: AC
  Filled 2018-12-04: qty 4

## 2018-12-04 MED ORDER — FUROSEMIDE 10 MG/ML IJ SOLN
40.0000 mg | Freq: Two times a day (BID) | INTRAMUSCULAR | Status: DC
  Administered 2018-12-04 – 2018-12-05 (×3): 40 mg via INTRAVENOUS
  Filled 2018-12-04 (×2): qty 4

## 2018-12-04 MED ORDER — OXYCODONE HCL 5 MG PO TABS
5.0000 mg | ORAL_TABLET | Freq: Every day | ORAL | Status: DC
  Administered 2018-12-04 – 2018-12-07 (×4): 5 mg via ORAL
  Filled 2018-12-04 (×4): qty 1

## 2018-12-04 NOTE — Progress Notes (Signed)
eLink Physician-Brief Progress Note Patient Name: Natalie Thomas DOB: 06-17-1961 MRN: [id number]   Date of Service  12/04/2018  HPI/Events of Note  Hyponatremia - S Osm = 118 and Osmolarity = 247.   eICU Interventions  Will order: 1. Lasix 40 mg IV now.      Intervention Category Major Interventions: Electrolyte abnormality - evaluation and management  Dawnna Gritz [place] 12/04/2018, 12:55 AM

## 2018-12-04 NOTE — Progress Notes (Signed)
Initial Nutrition Assessment   RD working remotely.   DOCUMENTATION CODES:   Obesity unspecified  INTERVENTION:   Tube Feeding: GOAL RATE Vital High Protein at 50 ml/hr Provides 1320 kcals, 116 g of protein and 1109 mL of free water Meets 100% protein and calorie needs   NUTRITION DIAGNOSIS:   Inadequate oral intake related to acute illness as evidenced by NPO status.  GOAL:   Provide needs based on ASPEN/SCCM guidelines  MONITOR:   Vent status, Labs, Weight trends, TF tolerance  REASON FOR ASSESSMENT:   Consult, Ventilator Enteral/tube feeding initiation and management  ASSESSMENT:   58 yo female admitted with acute respiratory failure 2/2 HCAP, pulmonary edema requiring intubation, worsening chronic hyponatremia. PMH includes HTN, HLD, CHF, COPD, chronic hyponatremia, polycythemia, depression, schizoaffective disorder   Patient is currently intubated on ventilator support MV: 7.5 L/min Temp (24hrs), Avg:98.4 F (36.9 C), Min:97.9 F (36.6 C), Max:98.8 F (37.1 C)  Propofol: N/A  Unable to obtain diet and weight history at this time. Per weight encounters, no weight loss trend noted  Trickle TF of Vital High Protein started by MD this AM per protocol Hypoglycemia over night, D10 at 20 ml/hr started; should improve with initiation of TF   Chest xray indicating OG tube extends into the stomach  Weight 92.6 kg; admission wt 93.4 kg, Net negative 1 L per I/O flow sheet  Labs: sodium 122 (H), CBGs 65-119 Meds: lasix, D10 at 20 m/hr  NUTRITION - FOCUSED PHYSICAL EXAM:  Unable to assess, working remotely  Diet Order:   Diet Order            Diet NPO time specified  Diet effective now              EDUCATION NEEDS:   Not appropriate for education at this time  Skin:  Skin Assessment: Reviewed RN Assessment  Last BM:  no documented BM  Height:   Ht Readings from Last 1 Encounters:  12/03/18 5\' 3"  (1.6 m)    Weight:   Wt Readings from  Last 1 Encounters:  12/04/18 92.6 kg    Ideal Body Weight:  52.3 kg  BMI:  Body mass index is 36.16 kg/m.  Estimated Nutritional Needs:   Kcal:  9169-4503 kcals  Protein:  105-130  Fluid:  >/= 1.3 L  Kerman Passey MS, RD, LDN, CNSC [phone] Pager  [phone] Weekend/On-Call Pager

## 2018-12-04 NOTE — Progress Notes (Signed)
  Echocardiogram 2D Echocardiogram has been performed.  Jannett Celestine 12/04/2018, 1:52 PM

## 2018-12-04 NOTE — Progress Notes (Signed)
CRITICAL VALUE ALERT  Critical Value:  Serum osmolality 247  Date & Time Notied:  12/04/18 12:52 AM   Provider Notified: Dr. Oletta Darter  Orders Received/Actions taken: See new orders

## 2018-12-04 NOTE — Progress Notes (Signed)
NAME:  Natalie Thomas, MRN:  [id number], DOB:  [birth year], LOS: 1 ADMISSION DATE:  12/03/2018, CONSULTATION DATE:  12/03/18 REFERRING MD:  Rex Kras CHIEF COMPLAINT:  SOB   Brief History   Natalie Thomas is a 58 y.o. female who resides at [place] home, presented to ED 6/10 with dyspnea and hypoxic/hypercarbic respiratory failure requiring intubation. Chest x-ray showing bibasal consolidation, presumptively treated for H CAP   Past Medical History  HTN, HLD, CHF, COPD, chronic hyponatremia, polycythemia, DM, depression, schizoaffective disorder  Significant Hospital Events   6/10 > admit, intubated in ED. 6/10 Propofol turned down due to hypotension.  Consults:  None.  Procedures:  ETT 6/10 >   Significant Diagnostic Tests:  CXR 6/10 > right effusion, bilateral basilar consolidation. Echo 6/10 >   Micro Data:  Blood 6/10 > ng Sputum 6/10 >  Urine 6/10 >  SARS CoV2 6/10 >   Antimicrobials:  Vanc 6/10 >  Cefepime 6/10 >   Interim history/subjective:  Sedated on fentanyl drip Afebrile Hemodynamically stable, critically ill, intubated  Objective:  Blood pressure (!) 150/52, pulse (!) 57, temperature 98.6 F (37 C), temperature source Oral, resp. rate 18, height 5\' 3"  (1.6 m), weight 92.6 kg, SpO2 97 %.    Vent Mode: PRVC FiO2 (%):  [60 %-100 %] 60 % Set Rate:  [18 bmp] 18 bmp Vt Set:  [420 mL] 420 mL PEEP:  [8 cmH20] 8 cmH20 Plateau Pressure:  [17 cmH20-29 cmH20] 20 cmH20   Intake/Output Summary (Last 24 hours) at 12/04/2018 0835 Last data filed at 12/04/2018 0700 Gross per 24 hour  Intake 1100.47 ml  Output 2200 ml  Net -1099.53 ml   Filed Weights   12/03/18 1415 12/03/18 2015 12/04/18 0500  Weight: 83 kg 93.4 kg 92.6 kg    Examination : General: Adult female, in NAD. Neuro: RA SS -1 on fentanyl drip, nonfocal HEENT: [place]/AT. Sclerae anicteric.  ETT in place. Cardiovascular: Loletha Grayer, regular, no M/R/G.  Lungs: Respirations even and  unlabored.  No accessory muscle use, decreased breath sounds bilateral Abdomen: BS x 4, soft, NT/ND.  Musculoskeletal: No gross deformities, no edema.  Skin: Intact, warm, no rashes.  X-ray 6/11 personally reviewed which shows ET tube in position and bibasal infiltrates/atelectasis  Assessment & Plan:   Acute hypoxic and hypercapnic respiratory failure - unclear etiology though some concern for HCAP in addition to underlying COPD and possible mild pulmonary edema.   -Start spontaneous breathing trials with goal extubation SBT. - Bronchial hygiene. - Empiric vanc / cefepime for now and follow cultures.  Since procalcitonin is negative, will discontinue antibiotics in 24 hours if no growth   Hypotension with bradycardia - due to propofol, was initially hypertensive in NSR. - D/c propofol and switch to fentanyl infusion.  Hx chronic hyponatremia - likely due to multiple psychotropic meds and?  Polydipsia, baseline 1 25-1 30 range - Follow BMP. -Replete hypomagnesemia  Hx HTN, HLD, CHF.  - Assess echo. - Continue preadmission ASA, pravastatin. - Hold preadmission carvedilol, furosemide, hydralazine, nifedipine.  Hx COPD. - Budesonide / Brovana in lieu of preadmission breo, duonebs.  Hx depression, schizoaffective disorder. - Continue preadmission depakote , levels were low -Resume preadmission asenapine, diazepam, trazodone, lurasidone, paxil, gabapentin. -Hold bupropion  Best Practice:  Diet: NPO. Pain/Anxiety/Delirium protocol (if indicated): fentanyl gtt / midazolam PRN.  RASS goal -1. VAP protocol (if indicated): In place. DVT prophylaxis: SCD's / Heparin. GI prophylaxis: PPI. Glucose control: None. Mobility: Bedrest. Code Status: Full.  Family Communication: None available. Disposition: ICU.   The patient is critically ill with multiple organ systems failure and requires high complexity decision making for assessment and support, frequent evaluation and titration of  therapies, application of advanced monitoring technologies and extensive interpretation of multiple databases. Critical Care Time devoted to patient care services described in this note independent of APP/resident  time is 35 minutes.   Kara Mead MD. [facility] Flood. [facility] Pager [phone] If no response call [phone]     12/04/2018, 8:35 AM

## 2018-12-04 NOTE — Progress Notes (Signed)
eLink Physician-Brief Progress Note Patient Name: Natalie Thomas DOB: 03/03/1961 MRN: [id number]   Date of Service  12/04/2018  HPI/Events of Note  Hypoglycemia x 2  - Blood glucose = 65 and 73  eICU Interventions  Will order: 1. D10W to run IV at 20 mL/hour.      Intervention Category Major Interventions: Other:  Lysle Dingwall 12/04/2018, 4:30 AM

## 2018-12-04 NOTE — Progress Notes (Signed)
Hypoglycemic Event  CBG: 65  Treatment: D50 25 mL (12.5 gm)  Symptoms: None  Follow-up CBG: [id number] CBG Result:119  Possible Reasons for Event: Inadequate meal intake  Comments/MD notified:Dr. Oletta Darter notified, requesting dextrose in fluids    Natalie Thomas

## 2018-12-04 NOTE — Procedures (Signed)
Extubation Procedure Note  Patient Details:   Name: Natalie Thomas DOB: May 12, 1961 MRN: [id number]   Airway Documentation:    Vent end date: 12/04/18 Vent end time: 1550   Evaluation  O2 sats: stable throughout Complications: No apparent complications Patient did tolerate procedure well. Bilateral Breath Sounds: Diminished   Yes   Positive cuff leak. Patient extubated to 6L [place] with humidity. No stridor noted. RN and 2 RTs at bedside. Patient is stable.   [place] 12/04/2018, 3:57 PM

## 2018-12-04 NOTE — Progress Notes (Signed)
RT attempted to wean patient once sedation wore off. Patient continues to go apneic when switched to PS/CPAP for weaning.

## 2018-12-05 ENCOUNTER — Inpatient Hospital Stay (HOSPITAL_COMMUNITY): Payer: Medicare Other

## 2018-12-05 DIAGNOSIS — J9621 Acute and chronic respiratory failure with hypoxia: Secondary | ICD-10-CM

## 2018-12-05 DIAGNOSIS — B9620 Unspecified Escherichia coli [E. coli] as the cause of diseases classified elsewhere: Secondary | ICD-10-CM

## 2018-12-05 DIAGNOSIS — E876 Hypokalemia: Secondary | ICD-10-CM

## 2018-12-05 DIAGNOSIS — I509 Heart failure, unspecified: Secondary | ICD-10-CM

## 2018-12-05 DIAGNOSIS — E871 Hypo-osmolality and hyponatremia: Secondary | ICD-10-CM

## 2018-12-05 DIAGNOSIS — J9622 Acute and chronic respiratory failure with hypercapnia: Principal | ICD-10-CM

## 2018-12-05 DIAGNOSIS — N390 Urinary tract infection, site not specified: Secondary | ICD-10-CM

## 2018-12-05 LAB — BASIC METABOLIC PANEL WITH GFR
Anion gap: 10 (ref 5–15)
BUN: 5 mg/dL — ABNORMAL LOW (ref 6–20)
CO2: 35 mmol/L — ABNORMAL HIGH (ref 22–32)
Calcium: 8.3 mg/dL — ABNORMAL LOW (ref 8.9–10.3)
Chloride: 84 mmol/L — ABNORMAL LOW (ref 98–111)
Creatinine, Ser: 0.36 mg/dL — ABNORMAL LOW (ref 0.44–1.00)
GFR calc Af Amer: >60 mL/min
GFR calc non Af Amer: >60 mL/min
Glucose, Bld: 92 mg/dL (ref 70–99)
Potassium: 3.2 mmol/L — ABNORMAL LOW (ref 3.5–5.1)
Sodium: 129 mmol/L — ABNORMAL LOW (ref 135–145)

## 2018-12-05 LAB — GLUCOSE, CAPILLARY
Glucose-Capillary: 102 mg/dL — ABNORMAL HIGH (ref 70–99)
Glucose-Capillary: 112 mg/dL — ABNORMAL HIGH (ref 70–99)
Glucose-Capillary: 140 mg/dL — ABNORMAL HIGH (ref 70–99)
Glucose-Capillary: 172 mg/dL — ABNORMAL HIGH (ref 70–99)
Glucose-Capillary: 191 mg/dL — ABNORMAL HIGH (ref 70–99)
Glucose-Capillary: 86 mg/dL (ref 70–99)

## 2018-12-05 LAB — CBC
HCT: 39.7 % (ref 36.0–46.0)
Hemoglobin: 13.3 g/dL (ref 12.0–15.0)
MCH: 30.9 pg (ref 26.0–34.0)
MCHC: 33.5 g/dL (ref 30.0–36.0)
MCV: 92.1 fL (ref 80.0–100.0)
Platelets: 152 K/uL (ref 150–400)
RBC: 4.31 MIL/uL (ref 3.87–5.11)
RDW: 13.2 % (ref 11.5–15.5)
WBC: 7.4 K/uL (ref 4.0–10.5)
nRBC: 0 % (ref 0.0–0.2)

## 2018-12-05 LAB — URINE CULTURE: Culture: >=100000 — AB

## 2018-12-05 LAB — PROCALCITONIN: Procalcitonin: <0.1 ng/mL

## 2018-12-05 LAB — MAGNESIUM: Magnesium: 1.7 mg/dL (ref 1.7–2.4)

## 2018-12-05 LAB — PHOSPHORUS: Phosphorus: 4.5 mg/dL (ref 2.5–4.6)

## 2018-12-05 MED ORDER — FUROSEMIDE 10 MG/ML IJ SOLN
40.0000 mg | Freq: Once | INTRAMUSCULAR | Status: AC
  Administered 2018-12-05: 40 mg via INTRAVENOUS
  Filled 2018-12-05: qty 4

## 2018-12-05 MED ORDER — IPRATROPIUM-ALBUTEROL 0.5-2.5 (3) MG/3ML IN SOLN
3.0000 mL | Freq: Four times a day (QID) | RESPIRATORY_TRACT | Status: DC
  Administered 2018-12-05 – 2018-12-07 (×9): 3 mL via RESPIRATORY_TRACT
  Filled 2018-12-05 (×9): qty 3

## 2018-12-05 MED ORDER — VALPROIC ACID 250 MG PO CAPS
500.0000 mg | ORAL_CAPSULE | Freq: Two times a day (BID) | ORAL | Status: DC
  Administered 2018-12-05 – 2018-12-18 (×27): 500 mg via ORAL
  Filled 2018-12-05 (×29): qty 2

## 2018-12-05 MED ORDER — PREDNISONE 20 MG PO TABS
40.0000 mg | ORAL_TABLET | Freq: Every day | ORAL | Status: DC
  Administered 2018-12-05 – 2018-12-06 (×2): 40 mg via ORAL
  Filled 2018-12-05 (×2): qty 2

## 2018-12-05 MED ORDER — ALBUTEROL SULFATE (2.5 MG/3ML) 0.083% IN NEBU
2.5000 mg | INHALATION_SOLUTION | RESPIRATORY_TRACT | Status: DC | PRN
  Administered 2018-12-05 – 2018-12-10 (×3): 2.5 mg via RESPIRATORY_TRACT
  Filled 2018-12-05 (×3): qty 3

## 2018-12-05 MED ORDER — PANTOPRAZOLE SODIUM 40 MG PO TBEC
40.0000 mg | DELAYED_RELEASE_TABLET | Freq: Every day | ORAL | Status: DC
  Administered 2018-12-05 – 2018-12-17 (×13): 40 mg via ORAL
  Filled 2018-12-05 (×13): qty 1

## 2018-12-05 MED ORDER — SODIUM CHLORIDE 0.9 % IV SOLN
2.0000 g | INTRAVENOUS | Status: AC
  Administered 2018-12-05 – 2018-12-07 (×3): 2 g via INTRAVENOUS
  Filled 2018-12-05 (×3): qty 20

## 2018-12-05 MED ORDER — HYDRALAZINE HCL 50 MG PO TABS
100.0000 mg | ORAL_TABLET | Freq: Three times a day (TID) | ORAL | Status: DC
  Administered 2018-12-05 – 2018-12-18 (×41): 100 mg via ORAL
  Filled 2018-12-05 (×41): qty 2

## 2018-12-05 MED ORDER — CALCIUM CARBONATE ANTACID 500 MG PO CHEW
1.0000 | CHEWABLE_TABLET | Freq: Two times a day (BID) | ORAL | Status: DC | PRN
  Administered 2018-12-05 – 2018-12-07 (×2): 200 mg via ORAL
  Filled 2018-12-05 (×2): qty 1

## 2018-12-05 MED ORDER — POTASSIUM CHLORIDE CRYS ER 20 MEQ PO TBCR
30.0000 meq | EXTENDED_RELEASE_TABLET | ORAL | Status: AC
  Administered 2018-12-05 (×2): 30 meq via ORAL
  Filled 2018-12-05 (×3): qty 1

## 2018-12-05 NOTE — Progress Notes (Signed)
Nutrition Follow-up  DOCUMENTATION CODES:   Obesity unspecified  INTERVENTION:   Magic cup TID with meals, each supplement provides 290 kcal and 9 grams of protein   NUTRITION DIAGNOSIS:   Inadequate oral intake related to acute illness(PNA) as evidenced by meal completion < 50%. Ongoing.   GOAL:   Patient will meet greater than or equal to 90% of their needs Progressing.   MONITOR:   PO intake, Supplement acceptance, Skin  REASON FOR ASSESSMENT:   Consult, Ventilator Enteral/tube feeding initiation and management  ASSESSMENT:   58 yo female admitted with acute respiratory failure 2/2 HCAP, pulmonary edema requiring intubation, worsening chronic hyponatremia. PMH includes HTN, HLD, CHF, COPD, chronic hyponatremia, polycythemia, depression, schizoaffective disorder  6/11 extubated 6/12 diet advanced - appetite/intake not yet determined   Medications reviewed and include: KCl, prednisone Labs reviewed: Na 129 (L), K+ 3.2 (L)   NUTRITION - FOCUSED PHYSICAL EXAM:  Deferred   Diet Order:   Diet Order            DIET DYS 3 Room service appropriate? Yes; Fluid consistency: Thin  Diet effective now              EDUCATION NEEDS:   Not appropriate for education at this time  Skin:  Skin Assessment: (MASD: abdomen)  Last BM:  no documented BM  Height:   Ht Readings from Last 1 Encounters:  12/03/18 5\' 3"  (1.6 m)    Weight:   Wt Readings from Last 1 Encounters:  12/05/18 87.9 kg    Ideal Body Weight:  52.3 kg  BMI:  Body mass index is 34.33 kg/m.  Estimated Nutritional Needs:   Kcal:  1700-1900  Protein:  85-100 grams  Fluid:  > 1.7 L/day  Maylon Peppers RD, LDN, CNSC [phone] Pager [phone] After Hours Pager

## 2018-12-05 NOTE — Progress Notes (Addendum)
NAME:  Natalie Thomas, MRN:  [id number], DOB:  [birth year], LOS: 2 ADMISSION DATE:  12/03/2018, CONSULTATION DATE:  12/03/18 REFERRING MD:  Rex Kras CHIEF COMPLAINT:  SOB   Brief History   AMOUR TRIGG is a 58 y.o. female who resides at [place] home, presented to ED 6/10 with dyspnea and hypoxic/hypercarbic respiratory failure requiring intubation. Chest x-ray showing bibasal consolidation, presumptively treated for H CAP  Past Medical History  HTN, HLD, CHF, COPD, chronic hyponatremia, polycythemia, DM, depression, schizoaffective disorder  Significant Hospital Events   6/10 > admit, intubated in ED. 6/10 Propofol turned down due to hypotension.  Consults:  None.  Procedures:  ETT 6/10 > 6/11  Significant Diagnostic Tests:  CXR 6/10 > right effusion, bilateral basilar consolidation. Echo 6/10 >   Micro Data:  Blood 6/10 > ngtd Sputum 6/10 > pending Urine 6/10 > >100K E. coli SARS CoV2 6/10 > neg  Antimicrobials:  Vanc 6/10 > 6/11 Cefepime 6/10 > 6/11 Ceftriaxone 6/12  Interim history/subjective:  Extubated yesterday. Tolerating nasal cannula this morning. Awake, alert and oriented x 3  Objective:  Blood pressure (!) 167/53, pulse 82, temperature 98.8 F (37.1 C), temperature source Oral, resp. rate 12, height 5\' 3"  (1.6 m), weight 87.9 kg, SpO2 90 %.    Vent Mode: PSV;CPAP FiO2 (%):  [50 %] 50 % Set Rate:  [18 bmp] 18 bmp Vt Set:  [420 mL] 420 mL PEEP:  [5 cmH20] 5 cmH20 Pressure Support:  [5 cmH20-8 cmH20] 5 cmH20 Plateau Pressure:  [17 cmH20] 17 cmH20   Intake/Output Summary (Last 24 hours) at 12/05/2018 0802 Last data filed at 12/05/2018 0500 Gross per 24 hour  Intake 1058.14 ml  Output 5000 ml  Net -3941.86 ml   Filed Weights   12/03/18 2015 12/04/18 0500 12/05/18 0500  Weight: 93.4 kg 92.6 kg 87.9 kg   Physical Exam: General: Chronically ill-appearing, appears older than stated age, no acute distress HENT: [place], AT, OP clear, MMM,  wearing nasal cannula Eyes: EOMI, no scleral icterus Respiratory: Bilateral wheezing Cardiovascular: Irregularly irregular rhythm, -M/R/G, no JVD GI: BS+, soft, nontender Extremities:Trace pedal edema,-tenderness Neuro: AAO x4, CNII-XII grossly intact Psych: Normal mood, normal affect GU: Purwik in place  CXR 6/12 personally reviewed which demonstrates unchanged bibasilar effusions/atelectasis  Assessment & Plan:   Acute on chronic hypoxemic and hypercarbic respiratory failure, COPD exacerbation-on 2L [place] at home. Concern for pneumonia and mild pulmonary edema  -Extubated yesterday -Wean supplemental oxygen to goal SpO2 88-92% -Continue Pulmicort and Brovana -Add scheduled Duonebs and flutter valve for pulmonary hygience -Add PO steroids -Continue antibiotics as below -Lasix 40 mg now  CAP E.coli UTI -De-escalate antibiotics to Ceftriaxone to complete 5 days antibiotics (6/14)  Hypoglycemia - improved with PO intake post-extubation -DC D10 gtt  Hypokalemia, hypomagnesemia -Replete K, Mg  Hx chronic hyponatremia - likely due to multiple psychotropic meds and?  Polydipsia, baseline 1 25-1 30 range - improving -Trend BMP  Hx HTN, HLD, CHF ?Atrial fibrillation on telemetry -Obtain EKG -Continue preadmission ASA, pravastatin. -Restart home hydralazine -Hold preadmission furosemide, carvedilol, hydralazine, nifedipine.  Hx depression, schizoaffective disorder -Preadmission depakote , levels were low -Continue preadmission asenapine, diazepam, trazodone, lurasidone, paxil, gabapentin. -Hold bupropion  Best Practice:  Diet: Yes Pain/Anxiety/Delirium protocol (if indicated): -- VAP protocol (if indicated): -- DVT prophylaxis: SCD's / Heparin. GI prophylaxis: Home PPI. Glucose control: None. Mobility: Bedrest. Code Status: Full. Family Communication: Updated patient at bedside Disposition: Transfer to floor   Care Time devoted  to patient care services including  medication review described in this note is 35 Minutes.    Rodman Pickle, M.D. [facility] Pulmonary/Critical Care Medicine Pager: [phone] After hours pager: [phone]

## 2018-12-05 NOTE — Evaluation (Signed)
Clinical/Bedside Swallow Evaluation Patient Details  Name: Natalie Thomas MRN: [id number] Date of Birth: 09-Aug-1960  Today's Date: 12/05/2018 Time: SLP Start Time (ACUTE ONLY): (P) 0640 SLP Stop Time (ACUTE ONLY): (P) 0653 SLP Time Calculation (min) (ACUTE ONLY): (P) 13 min  Past Medical History:  Past Medical History:  Diagnosis Date  . Acute on chronic respiratory failure with hypoxia and hypercapnia ([place]) 10/07/2017  . Anemia 10/16/2017  . Anxiety and depression   . CHF (congestive heart failure) ([place])   . Chronic hyponatremia 04/08/2007   Qualifier: Diagnosis of  By: Marca Ancona RMA, Lucy    . Chronic respiratory failure with hypoxia ([place]) 10/07/2017  . Closed comminuted intertrochanteric fracture of right femur ([place]) 11/07/2017  . COPD 04/08/2007   Qualifier: Diagnosis of  By: Marca Ancona RMA, Lucy    . Cough 05/08/2011  . Depression   . Diabetes mellitus, type 2 ([place])    pt denies but states that she has been treated for DM  . Dyspnea 10/16/2017  . Dysuria 04/08/2007   Qualifier: Diagnosis of  By: Reatha Armour, Lucy    . Essential hypertension 04/08/2007   Qualifier: Diagnosis of  By: Marca Ancona RMA, Lucy    . GERD (gastroesophageal reflux disease)   . HIP PAIN, LEFT 12/12/2007   Qualifier: Diagnosis of  By: Loanne Drilling MD, Jacelyn Pi   . Hyperlipidemia 10/07/2017  . Hypertension   . Hyponatremia 07/27/2011  . Incontinence of urine 08/14/2011  . Lumbar disc disease 04/09/2011  . MENOPAUSE, PREMATURE 04/08/2007   Qualifier: Diagnosis of  By: Marca Ancona RMA, Lucy    . 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 Osteoporosis   . Osteoporosis 04/08/2007   Qualifier: Diagnosis of  By: Reatha Armour, Lucy    . Polycythemia secondary to smoking 04/09/2011  . S/P right hip fracture 11/07/2017  . Schizoaffective disorder, bipolar type ([place]) 07/05/2011   Past Surgical History:  Past Surgical History:  Procedure Laterality Date  . EYE SURGERY     on left  eye, pt states  that she sees bad out of the right eye and needs surgery there  . INTRAMEDULLARY (IM) NAIL INTERTROCHANTERIC Right 11/13/2017   Procedure: INTRAMEDULLARY (IM) NAIL INTERTROCHANTRIC;  Surgeon: Shona Needles, MD;  Location: [facility];  Service: Orthopedics;  Laterality: Right;  . PELVIC FRACTURE SURGERY     HPI:  Natalie Thomas is a 58 y.o. female who has a PMH including but not limited to HTN, HLD, CHF, COPD, chronic hyponatremia, polycythemia, depression, schizoaffective disorder (see "past medical history" for rest).  She resides at [facility] and she presented to [facility] ED 6/10 with dyspnea and acute hypoxic respiratory failure (sats of 60% on RA).  She apparently had CXR at the facility which showed pulmonary edema. Pt intubated from 6/10 to 6/11.    Assessment / Plan / Recommendation Clinical Impression  Per chart review, pt with history of baseline cough related to respiratory comorbidities. Last St services were 07/2017 with pt discharging on dysphagia 3 diet with thin liquids. Pt reports that her meats are still chopped up and she takes her medicine with applesauce and is agreeable to having medicine curshed in puree if pills are large. During this Bedside Swallow Study, pt's oral motor abilities are functional with no focal neurological deficits observed. Pt appears at reduced risk of aspiration when following general aspiration precautions. Pt must be fully alert and upright in bed when consuming  PO intake. Pt consumed thin liquids via straw and cup sips with appearance of swift swallow and no overt s/s of aspiration. When consuming graham crackers (small pieces) pt with mildly prolonged mastication but suspect this is baseline as it was very functional for pt. Pt with good oral clearing and is appropriate to return to dysphagia 3 with thin liquids, small pills whole in puree or crushed in puree. Pt is likely to cough at baseline and during consumption but again this appears baseline  and her cough appears productive. All education completed with nursing and ST to sign off at no acute deficits were identified. ST can be reconsulted if pt condition changes.  SLP Visit Diagnosis: Dysphagia, unspecified (R13.10)    Aspiration Risk  Mild aspiration risk    Diet Recommendation Dysphagia 3 (Mech soft);Thin liquid   Liquid Administration via: Cup;Straw Medication Administration: Whole meds with puree Supervision: Staff to assist with self feeding;Patient able to self feed;Intermittent supervision to cue for compensatory strategies Compensations: Minimize environmental distractions;Slow rate;Small sips/bites Postural Changes: Seated upright at 90 degrees    Other  Recommendations Oral Care Recommendations: Oral care BID   Follow up Recommendations None      Frequency and Duration            Prognosis        Swallow Study   General Date of Onset: 12/04/18 HPI: Natalie Thomas is a 58 y.o. female who has a PMH including but not limited to HTN, HLD, CHF, COPD, chronic hyponatremia, polycythemia, depression, schizoaffective disorder (see "past medical history" for rest).  She resides at [facility] and she presented to [facility] ED 6/10 with dyspnea and acute hypoxic respiratory failure (sats of 60% on RA).  She apparently had CXR at the facility which showed pulmonary edema. Pt intubated from 6/10 to 6/11.  Type of Study: Bedside Swallow Evaluation Previous Swallow Assessment: BSE and ST service 07/2017 - discharged on dysphagia 3 with thin liquids Diet Prior to this Study: NPO Temperature Spikes Noted: No Respiratory Status: Nasal cannula History of Recent Intubation: Yes Length of Intubations (days): 1 days Date extubated: 12/04/18 Behavior/Cognition: Alert;Cooperative;Pleasant mood Oral Cavity Assessment: Within Functional Limits Oral Care Completed by SLP: No Oral Cavity - Dentition: Missing dentition;Poor condition Vision: Functional for  self-feeding Self-Feeding Abilities: Needs assist Patient Positioning: Upright in bed Baseline Vocal Quality: Normal Volitional Cough: Strong Volitional Swallow: Able to elicit    Oral/Motor/Sensory Function Overall Oral Motor/Sensory Function: Within functional limits   Ice Chips Ice chips: Within functional limits Presentation: Spoon;Self Fed   Thin Liquid Thin Liquid: Within functional limits Presentation: Self Fed;Straw;Cup    Nectar Thick Nectar Thick Liquid: Not tested   Honey Thick Honey Thick Liquid: Not tested   Puree Puree: Within functional limits Presentation: Self Fed;Spoon   Solid     Solid: Within functional limits Presentation: Self Fed;Spoon(soft solids)      Keiosha Cancro 12/05/2018,7:20 AM

## 2018-12-05 NOTE — Evaluation (Signed)
Physical Therapy Evaluation Patient Details Name: QUINTASIA THEROUX MRN: [id number] DOB: 07/30/1960 Today's Date: 12/05/2018   History of Present Illness  Pt adm from SNF with hypoxic respiratory failure due to copd exacerbation and PNA. PMH - rt hip fx, copd, schizoaffective disorder, htn, dm, chf, lumbar disc disease, pelvic fx  Clinical Impression  Pt admitted with above diagnosis and presents to PT with functional limitations due to deficits listed below (See PT problem list). Pt needs skilled PT to maximize independence and safety to allow discharge to back to SNF.      Follow Up Recommendations SNF    Equipment Recommendations  None recommended by PT    Recommendations for Other Services       Precautions / Restrictions Precautions Precautions: Fall Restrictions Weight Bearing Restrictions: No      Mobility  Bed Mobility Overal bed mobility: Needs Assistance Bed Mobility: Supine to Sit;Sit to Supine     Supine to sit: Min assist Sit to supine: Min assist   General bed mobility comments: Assist to elevate trunk into sitting and bring hips to EOB. Assist to bring legs back up in the bed to return to supine  Transfers Overall transfer level: Needs assistance Equipment used: Rolling walker (2 wheeled) Transfers: Sit to/from Stand Sit to Stand: Min assist         General transfer comment: Assist to bring hips up and for balance  Ambulation/Gait Ambulation/Gait assistance: Min assist Gait Distance (Feet): 2 Feet Assistive device: Rolling walker (2 wheeled) Gait Pattern/deviations: Step-to pattern;Decreased step length - right;Decreased step length - left;Shuffle;Trunk flexed Gait velocity: decr Gait velocity interpretation: <1.31 ft/sec, indicative of household ambulator General Gait Details: Assist for balance and support. Pt side stepped up the edge of the bed  Stairs            Wheelchair Mobility    Modified Rankin (Stroke Patients Only)        Balance Overall balance assessment: Needs assistance Sitting-balance support: No upper extremity supported;Feet supported Sitting balance-Leahy Scale: Fair     Standing balance support: Bilateral upper extremity supported Standing balance-Leahy Scale: Poor Standing balance comment: walker and min assist for static standing                             Pertinent Vitals/Pain      Home Living Family/patient expects to be discharged to:: Skilled nursing facility                 Additional Comments: Unsure of prior level of function    Prior Function Level of [place]: Needs assistance   Gait / Transfers Assistance Needed: Pt reports she was working on walking but doesn't know how long ago that was           Hand Dominance   Dominant Hand: Right    Extremity/Trunk Assessment   Upper Extremity Assessment Upper Extremity Assessment: Generalized weakness    Lower Extremity Assessment Lower Extremity Assessment: Generalized weakness       Communication      Cognition Arousal/Alertness: Awake/alert Behavior During Therapy: WFL for tasks assessed/performed Overall Cognitive Status: No family/caregiver present to determine baseline cognitive functioning                                        General Comments      Exercises  Assessment/Plan    PT Assessment Patient needs continued PT services  PT Problem List Decreased strength;Decreased activity tolerance;Decreased balance;Decreased mobility;Obesity       PT Treatment Interventions DME instruction;Gait training;Functional mobility training;Therapeutic activities;Therapeutic exercise;Balance training;Patient/family education    PT Goals (Current goals can be found in the Care Plan section)  Acute Rehab PT Goals Patient Stated Goal: not stated PT Goal Formulation: With patient Time For Goal Achievement: 12/19/18 Potential to Achieve Goals: Fair    Frequency Min  2X/week   Barriers to discharge        Co-evaluation               AM-PAC PT "6 Clicks" Mobility  Outcome Measure Help needed turning from your back to your side while in a flat bed without using bedrails?: A Little Help needed moving from lying on your back to sitting on the side of a flat bed without using bedrails?: A Little Help needed moving to and from a bed to a chair (including a wheelchair)?: A Little Help needed standing up from a chair using your arms (e.g., wheelchair or bedside chair)?: A Little Help needed to walk in hospital room?: A Little Help needed climbing 3-5 steps with a railing? : Total 6 Click Score: 16    End of Session Equipment Utilized During Treatment: Gait belt Activity Tolerance: Patient tolerated treatment well Patient left: in bed;with call bell/phone within reach;with bed alarm set Nurse Communication: Mobility status PT Visit Diagnosis: Unsteadiness on feet (R26.81);Muscle weakness (generalized) (M62.81);Other abnormalities of gait and mobility (R26.89)    Time: 1548-1610 PT Time Calculation (min) (ACUTE ONLY): 22 min   Charges:   PT Evaluation $PT Eval Moderate Complexity: 1 [place] Pager [phone] Office [place] 12/05/2018, 5:10 PM

## 2018-12-06 ENCOUNTER — Other Ambulatory Visit: Payer: Self-pay

## 2018-12-06 DIAGNOSIS — G9341 Metabolic encephalopathy: Secondary | ICD-10-CM

## 2018-12-06 DIAGNOSIS — I10 Essential (primary) hypertension: Secondary | ICD-10-CM

## 2018-12-06 DIAGNOSIS — J441 Chronic obstructive pulmonary disease with (acute) exacerbation: Secondary | ICD-10-CM

## 2018-12-06 DIAGNOSIS — I5033 Acute on chronic diastolic (congestive) heart failure: Secondary | ICD-10-CM

## 2018-12-06 LAB — BLOOD GAS, ARTERIAL
Acid-Base Excess: 18 mmol/L — ABNORMAL HIGH (ref 0.0–2.0)
Bicarbonate: 43.4 mmol/L — ABNORMAL HIGH (ref 20.0–28.0)
Drawn by: 28338
O2 Content: 5 L/min
O2 Saturation: 84.2 %
Patient temperature: 98.6
pCO2 arterial: 62.8 mmHg — ABNORMAL HIGH (ref 32.0–48.0)
pH, Arterial: 7.454 — ABNORMAL HIGH (ref 7.350–7.450)
pO2, Arterial: 48.6 mmHg — ABNORMAL LOW (ref 83.0–108.0)

## 2018-12-06 LAB — BRAIN NATRIURETIC PEPTIDE: B Natriuretic Peptide: 132.3 pg/mL — ABNORMAL HIGH (ref 0.0–100.0)

## 2018-12-06 LAB — GLUCOSE, CAPILLARY
Glucose-Capillary: 84 mg/dL (ref 70–99)
Glucose-Capillary: 113 mg/dL — ABNORMAL HIGH (ref 70–99)
Glucose-Capillary: 140 mg/dL — ABNORMAL HIGH (ref 70–99)
Glucose-Capillary: 181 mg/dL — ABNORMAL HIGH (ref 70–99)
Glucose-Capillary: 196 mg/dL — ABNORMAL HIGH (ref 70–99)

## 2018-12-06 LAB — BASIC METABOLIC PANEL
Anion gap: 13 (ref 5–15)
BUN: 6 mg/dL (ref 6–20)
CO2: 34 mmol/L — ABNORMAL HIGH (ref 22–32)
Calcium: 9 mg/dL (ref 8.9–10.3)
Chloride: 82 mmol/L — ABNORMAL LOW (ref 98–111)
Creatinine, Ser: 0.44 mg/dL (ref 0.44–1.00)
GFR calc Af Amer: >60 mL/min (ref >60)
GFR calc non Af Amer: >60 mL/min (ref >60)
Glucose, Bld: 131 mg/dL — ABNORMAL HIGH (ref 70–99)
Potassium: 3.7 mmol/L (ref 3.5–5.1)
Sodium: 129 mmol/L — ABNORMAL LOW (ref 135–145)

## 2018-12-06 LAB — CULTURE, RESPIRATORY W GRAM STAIN: Culture: NORMAL

## 2018-12-06 LAB — T4, FREE: Free T4: 1.15 ng/dL — ABNORMAL HIGH (ref 0.61–1.12)

## 2018-12-06 LAB — TSH: TSH: 0.241 u[IU]/mL — ABNORMAL LOW (ref 0.350–4.500)

## 2018-12-06 LAB — MAGNESIUM: Magnesium: 1.7 mg/dL (ref 1.7–2.4)

## 2018-12-06 MED ORDER — POTASSIUM CHLORIDE CRYS ER 20 MEQ PO TBCR
40.0000 meq | EXTENDED_RELEASE_TABLET | Freq: Once | ORAL | Status: AC
  Administered 2018-12-06: 40 meq via ORAL
  Filled 2018-12-06: qty 2

## 2018-12-06 MED ORDER — DIAZEPAM 2 MG PO TABS
2.0000 mg | ORAL_TABLET | Freq: Two times a day (BID) | ORAL | Status: DC
  Administered 2018-12-06 – 2018-12-18 (×24): 2 mg via ORAL
  Filled 2018-12-06 (×25): qty 1

## 2018-12-06 MED ORDER — NICOTINE 21 MG/24HR TD PT24
21.0000 mg | MEDICATED_PATCH | Freq: Every day | TRANSDERMAL | Status: DC
  Administered 2018-12-06 – 2018-12-18 (×12): 21 mg via TRANSDERMAL
  Filled 2018-12-06 (×13): qty 1

## 2018-12-06 MED ORDER — MAGNESIUM SULFATE 2 GM/50ML IV SOLN
2.0000 g | Freq: Once | INTRAVENOUS | Status: AC
  Administered 2018-12-06: 2 g via INTRAVENOUS
  Filled 2018-12-06: qty 50

## 2018-12-06 MED ORDER — FUROSEMIDE 10 MG/ML IJ SOLN
40.0000 mg | Freq: Two times a day (BID) | INTRAMUSCULAR | Status: DC
  Administered 2018-12-06 – 2018-12-08 (×5): 40 mg via INTRAVENOUS
  Filled 2018-12-06 (×5): qty 4

## 2018-12-06 MED ORDER — NIFEDIPINE ER OSMOTIC RELEASE 30 MG PO TB24
90.0000 mg | ORAL_TABLET | Freq: Every day | ORAL | Status: DC
  Administered 2018-12-06 – 2018-12-18 (×13): 90 mg via ORAL
  Filled 2018-12-06 (×15): qty 3

## 2018-12-06 MED ORDER — METOPROLOL TARTRATE 12.5 MG HALF TABLET
12.5000 mg | ORAL_TABLET | Freq: Two times a day (BID) | ORAL | Status: DC
  Administered 2018-12-06 – 2018-12-15 (×17): 12.5 mg via ORAL
  Filled 2018-12-06 (×19): qty 1

## 2018-12-06 MED ORDER — METHYLPREDNISOLONE SODIUM SUCC 125 MG IJ SOLR
80.0000 mg | Freq: Three times a day (TID) | INTRAMUSCULAR | Status: DC
  Administered 2018-12-06 – 2018-12-08 (×6): 80 mg via INTRAVENOUS
  Filled 2018-12-06 (×6): qty 2

## 2018-12-06 MED ORDER — BUPROPION HCL ER (XL) 150 MG PO TB24
150.0000 mg | ORAL_TABLET | Freq: Every day | ORAL | Status: DC
  Administered 2018-12-06 – 2018-12-18 (×13): 150 mg via ORAL
  Filled 2018-12-06 (×13): qty 1

## 2018-12-06 MED ORDER — GUAIFENESIN-DM 100-10 MG/5ML PO SYRP
5.0000 mL | ORAL_SOLUTION | Freq: Three times a day (TID) | ORAL | Status: DC | PRN
  Administered 2018-12-06 – 2018-12-08 (×4): 5 mL via ORAL
  Filled 2018-12-06 (×4): qty 5

## 2018-12-06 NOTE — Progress Notes (Signed)
PROGRESS NOTE  Natalie Thomas WCB:[id number] DOB: [birth year] DOA: 12/03/2018 PCP: System, Pcp Not In   LOS: 3 days   Patient is from: [place] home  Brief Narrative / Interim history: 58 year old female with history of COPD, diastolic CHF, hypertension, hyperlipidemia, chronic hyponatremia, polycythemia, depression and schizoaffective disorder admitted with acute on chronic respiratory failure with desaturation to 60% on room air.  CXR showed bibasilar consolidation thought to be due to H CAP.  ABG with chronic respiratory acidosis.  Intubated on 6/10 and admitted to ICU.  Started on broad-spectrum antibiotic.  COVID-19 and blood cultures negative.  Urine showed grew E. coli.  Patient was extubated on 12/04/2018.  TRH assumed care on 12/06/2018.  Subjective: Patient is also a cannula saturating at 89 to 90%.  She is somnolent and briefly arises to voice and falls asleep.  Does not appear to be in distress.  Appears to be breathing through his mouth.   Assessment & Plan: Acute on chronic hypoxemic and hypercarbic respiratory failure due to COPD exacerbation and possible CHF exacerbation. -Procalcitonin is negligible which makes bacterial pneumonia unlikely. -COVID-19 and blood cultures negative. -ETT 6/10-6/11. -Vanc and cefepime 6/10 > 6/11 -Ceftriaxone 6/12-she also have E. coli UTI. -Systemic steroids 6/10--escalated to Solu-Medrol 6/13 -Continue Brovana, budesonide and DuoNeb. -Repeat ABG on 6/13 reassuring. -Decreased diazepam 2 mg twice daily 6/13 due to somnolence.  Acute metabolic encephalopathy: Unknown baseline.  Could be due to the above, possible UTI.  Also on Valium 5 mg 3 times daily. -Reduce Valium to 2 mg twice daily -Treat treatable causes -Frequent reorientation -Check thyroid panel  Acute on chronic diastolic CHF: Echo on 6/16 with EF greater than 65% and no significant structural abnormalities.  Patient was hypertensive on arrival.  CXR with  bibasilar opacities.  BNP marginally elevated. -We will start IV Lasix 40 mg twice daily to see if respiratory improves with this. -Daily weight, intake output and renal functions -Closely monitor electrolytes and replenish aggressively  E. coli UTI: Unknown if this is colonization as patient could not provide history.No fever or leukocytosis.  -We will continue ceftriaxone as above  Chronic hyponatremia: Sodium 118 on arrival.  129 today which is baseline. -Continue monitoring  Depression/anxiety/schizoaffective disorder  -Continue home Depakote, levels were low. -Continue home carcinoma pain, trazodone, Latuda and Paxil -Diazepam reduced.  Gabapentin on hold.  Essential hypertension: Vented.  On Procardia, labetalol, and hydralazine at home. -Metoprolol 12.5 mg twice daily-beta-1 selective. -IV Lasix as above -Resume home Procardia  GERD: -PPI  Scheduled Meds:  arformoterol  15 mcg Nebulization BID   asenapine  5 mg Sublingual BID   aspirin  81 mg Per Tube Daily   budesonide (PULMICORT) nebulizer solution  0.5 mg Nebulization BID   Chlorhexidine Gluconate Cloth  6 each Topical Daily   diazepam  5 mg Oral BID   furosemide  40 mg Intravenous BID   heparin  5,000 Units Subcutaneous Q8H   hydrALAZINE  100 mg Oral Q8H   ipratropium-albuterol  3 mL Nebulization Q6H   lurasidone  40 mg Oral BID   mouth rinse  15 mL Mouth Rinse BID   methylPREDNISolone (SOLU-MEDROL) injection  80 mg Intravenous Q8H   mupirocin ointment  1 application Nasal BID   oxyCODONE  5 mg Oral Daily   pantoprazole  40 mg Oral QHS   pravastatin  20 mg Per Tube q1800   traZODone  150 mg Oral QHS   valproic acid  500 mg Oral BID  Continuous Infusions:  cefTRIAXone (ROCEPHIN)  IV 2 g (12/06/18 1103)   PRN Meds:.albuterol, calcium carbonate   DVT prophylaxis: Subcu heparin Code Status: Full code Family Communication: Pending Disposition Plan: Remains inpatient for acute on chronic  respiratory failure and encephalopathy.  Consultants:   PCCM  Procedures:   ETT 6/10 on 6/11  Microbiology:  Blood cultures negative  COVID-19 negative  Urine culture E. Coli  Sputum culture pending  Antimicrobials: Anti-infectives (From admission, onward)   Start     Dose/Rate Route Frequency Ordered Stop   12/05/18 1000  cefTRIAXone (ROCEPHIN) 2 g in sodium chloride 0.9 % 100 mL IVPB     2 g 200 mL/hr over 30 Minutes Intravenous Every 24 hours 12/05/18 0814 12/08/18 0959   12/04/18 1800  vancomycin (VANCOCIN) 1,250 mg in sodium chloride 0.9 % 250 mL IVPB  Status:  Discontinued     1,250 mg 166.7 mL/hr over 90 Minutes Intravenous Every 24 hours 12/03/18 1744 12/05/18 1131   12/03/18 1715  vancomycin (VANCOCIN) 1,500 mg in sodium chloride 0.9 % 500 mL IVPB     1,500 mg 250 mL/hr over 120 Minutes Intravenous  Once 12/03/18 1709 12/03/18 2037   12/03/18 1715  ceFEPIme (MAXIPIME) 2 g in sodium chloride 0.9 % 100 mL IVPB  Status:  Discontinued     2 g 200 mL/hr over 30 Minutes Intravenous Every 8 hours 12/03/18 1709 12/05/18 0814       Objective: Vitals:   12/06/18 0739 12/06/18 0811 12/06/18 0813 12/06/18 0817  BP: (!) 182/59     Pulse: 90     Resp: 17     Temp: 98.2 F (36.8 C)     TempSrc: Oral     SpO2: 90% 92% 91% 94%  Weight:      Height:        Intake/Output Summary (Last 24 hours) at 12/06/2018 1113 Last data filed at 12/06/2018 1031 Gross per 24 hour  Intake 240 ml  Output 2250 ml  Net -2010 ml   Filed Weights   12/04/18 0500 12/05/18 0500 12/06/18 0300  Weight: 92.6 kg 87.9 kg 92.7 kg    Examination:  GENERAL: Somnolent.  Briefly awakens to voice and falls sleep. HEENT: MMM.  Vision and hearing grossly intact.  NECK: Supple.  No apparent JVD LUNGS:  No IWOB.  Poor aeration bilaterally.  Rhonchi bilaterally. HEART:  RRR. Heart sounds normal.  ABD: Bowel sounds present. Soft. Non tender.  MSK/EXT:  Moves all extremities. No apparent  deformity. No edema bilaterally.  SKIN: no apparent skin lesion or wound NEURO: Somnolent.  Briefly arouses to voice and falls back to sleep.  No apparent focal neuro deficit. PSYCH: Sleepy.   Data Reviewed: I have independently reviewed following labs and imaging studies  CBC: Recent Labs  Lab 12/03/18 0957  12/03/18 1420 12/03/18 1630 12/03/18 1850 12/04/18 0451 12/05/18 0606  WBC 5.3  --   --   --   --  6.4 7.4  NEUTROABS 4.2  --   --   --   --   --   --   HGB 13.8   < > 17.0* 15.0 16.3* 13.4 13.3  HCT 41.1   < > 50.0* 44.0 48.0* 39.4 39.7  MCV 92.2  --   --   --   --  90.8 92.1  PLT 153  --   --   --   --  123* 152   < > = values in this interval not  displayed.   Basic Metabolic Panel: Recent Labs  Lab 12/03/18 0957  12/03/18 1630 12/03/18 1850 12/04/18 0451 12/05/18 0606 12/06/18 0911  NA 118*   < > 118* 118* 122* 129* 129*  K 4.3   < > 3.8 4.5 3.9 3.2* 3.7  CL 78*  --   --   --  84* 84* 82*  CO2 30  --   --   --  29 35* 34*  GLUCOSE 135*  --   --   --  93 92 131*  BUN 5*  --   --   --  8 5* 6  CREATININE 0.47  --   --   --  0.53 0.36* 0.44  CALCIUM 8.3*  --   --   --  8.1* 8.3* 9.0  MG  --   --   --   --  1.5* 1.7 1.7  PHOS  --   --   --   --  3.5 4.5  --    < > = values in this interval not displayed.   GFR: Estimated Creatinine Clearance: 83.9 mL/min (by C-G formula based on SCr of 0.44 mg/dL). Liver Function Tests: Recent Labs  Lab 12/03/18 0957  AST 15  ALT 10  ALKPHOS 63  BILITOT 0.5  PROT 6.1*  ALBUMIN 2.6*   No results for input(s): LIPASE, AMYLASE in the last 168 hours. No results for input(s): AMMONIA in the last 168 hours. Coagulation Profile: No results for input(s): INR, PROTIME in the last 168 hours. Cardiac Enzymes: Recent Labs  Lab 12/03/18 1830  TROPONINI <0.03   BNP (last 3 results) No results for input(s): PROBNP in the last 8760 hours. HbA1C: No results for input(s): HGBA1C in the last 72 hours. CBG: Recent Labs  Lab  12/05/18 1109 12/05/18 1536 12/05/18 1942 12/06/18 0533 12/06/18 0741  GLUCAP 140* 191* 172* 84 113*   Lipid Profile: Recent Labs    12/03/18 1820  TRIG 111   Thyroid Function Tests: No results for input(s): TSH, T4TOTAL, FREET4, T3FREE, THYROIDAB in the last 72 hours. Anemia Panel: No results for input(s): VITAMINB12, FOLATE, FERRITIN, TIBC, IRON, RETICCTPCT in the last 72 hours. Urine analysis:    Component Value Date/Time   COLORURINE YELLOW 12/03/2018 1930   APPEARANCEUR CLEAR 12/03/2018 1930   APPEARANCEUR Clear 06/22/2012 0618   LABSPEC 1.008 12/03/2018 1930   LABSPEC 1.003 06/22/2012 0618   PHURINE 6.0 12/03/2018 1930   GLUCOSEU NEGATIVE 12/03/2018 1930   GLUCOSEU Negative 06/22/2012 0618   GLUCOSEU NEGATIVE 04/09/2011 [place] 12/03/2018 1930   BILIRUBINUR NEGATIVE 12/03/2018 1930   BILIRUBINUR Negative 06/22/2012 0618   [place] NEGATIVE 12/03/2018 1930   PROTEINUR NEGATIVE 12/03/2018 1930   UROBILINOGEN 1.0 08/18/2011 1805   NITRITE NEGATIVE 12/03/2018 1930   LEUKOCYTESUR TRACE (A) 12/03/2018 1930   LEUKOCYTESUR Trace 06/22/2012 0618   Sepsis Labs: Invalid input(s): PROCALCITONIN, LACTICIDVEN  Recent Results (from the past 240 hour(s))  Novel Coronavirus,NAA,(SEND-OUT TO REF LAB - TAT 24-48 hrs); Hosp Order     Status: None   Collection Time: 12/03/18 10:19 AM   Specimen: Nasopharyngeal Swab; Respiratory  Result Value Ref Range Status   SARS-CoV-2, NAA NOT DETECTED NOT DETECTED Final    Comment: (NOTE) This test was developed and its performance characteristics determined by [employer]. This test has not been FDA cleared or approved. This test has been authorized by FDA under an Emergency Use Authorization (EUA). This test is only authorized for the  duration of time the declaration that circumstances exist justifying the authorization of the emergency use of in vitro diagnostic tests for detection of SARS-CoV-2 virus and/or  diagnosis of COVID-19 infection under section 564(b)(1) of the Act, 21 U.S.C. 096EAV-4(U)(9), unless the authorization is terminated or revoked sooner. When diagnostic testing is negative, the possibility of a false negative result should be considered in the context of a patient's recent exposures and the presence of clinical signs and symptoms consistent with COVID-19. An individual without symptoms of COVID-19 and who is not shedding SARS-CoV-2 virus would expect to have a negative (not detected) result in this assay. Performed  At: [facility] [address] [place], [place] [id number] Rush Farmer MD NF:[id number]    [place]  Final    Comment: Performed at [facility], [place] [address], [place], [place] [address]  SARS Coronavirus 2     Status: None   Collection Time: 12/03/18  4:18 PM  Result Value Ref Range Status   SARS Coronavirus 2 NOT DETECTED NOT DETECTED Final    Comment: (NOTE) SARS-CoV-2 target nucleic acids are NOT DETECTED. The SARS-CoV-2 RNA is generally detectable in upper and lower respiratory specimens during the acute phase of infection.  Negative  results do not preclude SARS-CoV-2 infection, do not rule out co-infections with other pathogens, and should not be used as the sole basis for treatment or other patient management decisions.  Negative results must be combined with clinical observations, patient history, and epidemiological information. The expected result is Not Detected. Fact Sheet for Patients: http://www.biofiredefense.com/wp-content/uploads/2020/03/BIOFIRE-COVID -19-patients.pdf Fact Sheet for Healthcare Providers: http://www.biofiredefense.com/wp-content/uploads/2020/03/BIOFIRE-COVID -19-hcp.pdf This test is not yet approved or cleared by the [place] and  has been authorized for detection and/or diagnosis of SARS-CoV-2 by FDA under an Emergency Use Authorization (EUA).  This EUA  will remain in effec t (meaning this test can be used) for the duration of  the COVID-19 declaration under Section 564(b)(1) of the Act, 21 U.S.C. section 360bbb-3(b)(1), unless the authorization is terminated or revoked sooner. Performed at [facility], [place] [address] [place], [place] [address]   Culture, blood (routine x 2)     Status: None (Preliminary result)   Collection Time: 12/03/18  6:15 PM   Specimen: BLOOD  Result Value Ref Range Status   Specimen Description BLOOD SITE NOT SPECIFIED  Final   Special Requests   Final    BOTTLES DRAWN AEROBIC AND ANAEROBIC Blood Culture results may not be optimal due to an inadequate volume of blood received in culture bottles   Culture   Final    NO GROWTH 3 DAYS Performed at [facility], [place] [address] [place], [place] [address]    Report Status PENDING  Incomplete  Culture, blood (routine x 2)     Status: None (Preliminary result)   Collection Time: 12/03/18  6:24 PM   Specimen: BLOOD  Result Value Ref Range Status   Specimen Description BLOOD SITE NOT SPECIFIED  Final   Special Requests   Final    BOTTLES DRAWN AEROBIC AND ANAEROBIC Blood Culture results may not be optimal due to an inadequate volume of blood received in culture bottles   Culture   Final    NO GROWTH 3 DAYS Performed at [facility], [place] [address] [place],  [address]    Report Status PENDING  Incomplete  Urine culture     Status: Abnormal   Collection Time: 12/03/18  7:50 PM  Specimen: Urine, Random  Result Value Ref Range Status   Specimen Description URINE, RANDOM  Final   Special Requests   Final    NONE Performed at [facility], [address], [place], [place] [address]    Culture >=100,000 COLONIES/mL ESCHERICHIA COLI (A)  Final   Report Status 12/05/2018 FINAL  Final   Organism ID, Bacteria ESCHERICHIA COLI (A)  Final      Susceptibility   Escherichia coli - MIC*    AMPICILLIN >=32 RESISTANT Resistant      CEFAZOLIN 16 SENSITIVE Sensitive     CEFTRIAXONE <=1 SENSITIVE Sensitive     CIPROFLOXACIN >=4 RESISTANT Resistant     GENTAMICIN >=16 RESISTANT Resistant     IMIPENEM <=0.25 SENSITIVE Sensitive     NITROFURANTOIN <=16 SENSITIVE Sensitive     TRIMETH/SULFA <=20 SENSITIVE Sensitive     AMPICILLIN/SULBACTAM >=32 RESISTANT Resistant     PIP/TAZO 8 SENSITIVE Sensitive     Extended ESBL NEGATIVE Sensitive     * >=100,000 COLONIES/mL ESCHERICHIA COLI  MRSA PCR Screening     Status: Abnormal   Collection Time: 12/03/18  8:27 PM   Specimen: Nasal Mucosa; Nasopharyngeal  Result Value Ref Range Status   MRSA by PCR POSITIVE (A) NEGATIVE Final    Comment:        The GeneXpert MRSA Assay (FDA approved for NASAL specimens only), is one component of a comprehensive MRSA colonization surveillance program. It is not intended to diagnose MRSA infection nor to guide or monitor treatment for MRSA infections. RESULT CALLED TO, READ BACK BY AND VERIFIED WITH: PARRISH,J RN 12/03/2018 AT 2339 SKEEN,P Performed at [facility], [place] [address] [place], [place] [address]   Culture, respiratory (non-expectorated)     Status: None (Preliminary result)   Collection Time: 12/04/18  9:04 AM   Specimen: Tracheal Aspirate; Respiratory  Result Value Ref Range Status   Specimen Description TRACHEAL ASPIRATE  Final   Special Requests NONE  Final   Gram Stain   Final    RARE WBC PRESENT, PREDOMINANTLY PMN RARE GRAM POSITIVE COCCI    Culture   Final    CULTURE REINCUBATED FOR BETTER GROWTH Performed at [facility], [place] [address], [place], [place] [address]    Report Status PENDING  Incomplete      Radiology Studies: No results found.  35 minutes with more than 50% spent in reviewing records, counseling patient and coordinating care.  Delaila Nand T[facility] Pager [phone]  If 7PM-7AM, please contact night-coverage [email or web address] Password [email or web address] 12/06/2018, 11:13 AM

## 2018-12-07 DIAGNOSIS — G8929 Other chronic pain: Secondary | ICD-10-CM

## 2018-12-07 DIAGNOSIS — K5900 Constipation, unspecified: Secondary | ICD-10-CM

## 2018-12-07 DIAGNOSIS — F39 Unspecified mood [affective] disorder: Secondary | ICD-10-CM

## 2018-12-07 LAB — CBC
HCT: 41.4 % (ref 36.0–46.0)
Hemoglobin: 13.6 g/dL (ref 12.0–15.0)
MCH: 30.5 pg (ref 26.0–34.0)
MCHC: 32.9 g/dL (ref 30.0–36.0)
MCV: 92.8 fL (ref 80.0–100.0)
Platelets: 177 10*3/uL (ref 150–400)
RBC: 4.46 MIL/uL (ref 3.87–5.11)
RDW: 13.2 % (ref 11.5–15.5)
WBC: 7.7 10*3/uL (ref 4.0–10.5)
nRBC: 0 % (ref 0.0–0.2)

## 2018-12-07 LAB — BASIC METABOLIC PANEL
Anion gap: 13 (ref 5–15)
BUN: 14 mg/dL (ref 6–20)
CO2: 37 mmol/L — ABNORMAL HIGH (ref 22–32)
Calcium: 8.9 mg/dL (ref 8.9–10.3)
Chloride: 78 mmol/L — ABNORMAL LOW (ref 98–111)
Creatinine, Ser: 0.52 mg/dL (ref 0.44–1.00)
GFR calc Af Amer: >60 mL/min (ref >60)
GFR calc non Af Amer: >60 mL/min (ref >60)
Glucose, Bld: 196 mg/dL — ABNORMAL HIGH (ref 70–99)
Potassium: 3.8 mmol/L (ref 3.5–5.1)
Sodium: 128 mmol/L — ABNORMAL LOW (ref 135–145)

## 2018-12-07 LAB — HEMOGLOBIN A1C
Hgb A1c MFr Bld: 5.2 % (ref 4.8–5.6)
Mean Plasma Glucose: 102.54 mg/dL

## 2018-12-07 LAB — GLUCOSE, CAPILLARY
Glucose-Capillary: 199 mg/dL — ABNORMAL HIGH (ref 70–99)
Glucose-Capillary: 204 mg/dL — ABNORMAL HIGH (ref 70–99)

## 2018-12-07 LAB — MAGNESIUM: Magnesium: 2 mg/dL (ref 1.7–2.4)

## 2018-12-07 MED ORDER — IPRATROPIUM-ALBUTEROL 0.5-2.5 (3) MG/3ML IN SOLN
3.0000 mL | Freq: Three times a day (TID) | RESPIRATORY_TRACT | Status: DC
  Administered 2018-12-07 – 2018-12-09 (×7): 3 mL via RESPIRATORY_TRACT
  Filled 2018-12-07 (×8): qty 3

## 2018-12-07 MED ORDER — GABAPENTIN 300 MG PO CAPS
300.0000 mg | ORAL_CAPSULE | Freq: Every day | ORAL | Status: DC
  Administered 2018-12-07: 300 mg via ORAL
  Filled 2018-12-07: qty 1

## 2018-12-07 MED ORDER — SODIUM CHLORIDE 0.9 % IV SOLN
INTRAVENOUS | Status: DC | PRN
  Administered 2018-12-07: 250 mL via INTRAVENOUS

## 2018-12-07 MED ORDER — INSULIN ASPART 100 UNIT/ML ~~LOC~~ SOLN
0.0000 [IU] | Freq: Every day | SUBCUTANEOUS | Status: DC
  Administered 2018-12-07: 2 [IU] via SUBCUTANEOUS
  Administered 2018-12-09: 3 [IU] via SUBCUTANEOUS
  Administered 2018-12-16: 2 [IU] via SUBCUTANEOUS

## 2018-12-07 MED ORDER — POLYETHYLENE GLYCOL 3350 17 G PO PACK
17.0000 g | PACK | Freq: Two times a day (BID) | ORAL | Status: AC
  Administered 2018-12-07 – 2018-12-08 (×3): 17 g via ORAL
  Filled 2018-12-07 (×3): qty 1

## 2018-12-07 MED ORDER — OXYCODONE HCL 5 MG PO TABS
5.0000 mg | ORAL_TABLET | Freq: Three times a day (TID) | ORAL | Status: DC | PRN
  Administered 2018-12-07 – 2018-12-08 (×2): 5 mg via ORAL
  Filled 2018-12-07 (×2): qty 1

## 2018-12-07 MED ORDER — TRAMADOL HCL 50 MG PO TABS
50.0000 mg | ORAL_TABLET | Freq: Once | ORAL | Status: AC
  Administered 2018-12-07: 50 mg via ORAL
  Filled 2018-12-07: qty 1

## 2018-12-07 MED ORDER — ACETAMINOPHEN 325 MG PO TABS
650.0000 mg | ORAL_TABLET | Freq: Three times a day (TID) | ORAL | Status: DC
  Administered 2018-12-07 – 2018-12-10 (×9): 650 mg via ORAL
  Filled 2018-12-07 (×9): qty 2

## 2018-12-07 MED ORDER — INSULIN ASPART 100 UNIT/ML ~~LOC~~ SOLN
0.0000 [IU] | Freq: Three times a day (TID) | SUBCUTANEOUS | Status: DC
  Administered 2018-12-07 – 2018-12-08 (×5): 2 [IU] via SUBCUTANEOUS
  Administered 2018-12-08 – 2018-12-09 (×2): 5 [IU] via SUBCUTANEOUS
  Administered 2018-12-09 (×2): 3 [IU] via SUBCUTANEOUS
  Administered 2018-12-10: 1 [IU] via SUBCUTANEOUS
  Administered 2018-12-10: 2 [IU] via SUBCUTANEOUS
  Administered 2018-12-10: 1 [IU] via SUBCUTANEOUS
  Administered 2018-12-11: 2 [IU] via SUBCUTANEOUS
  Administered 2018-12-11: 1 [IU] via SUBCUTANEOUS
  Administered 2018-12-11: 2 [IU] via SUBCUTANEOUS
  Administered 2018-12-12 – 2018-12-13 (×3): 1 [IU] via SUBCUTANEOUS
  Administered 2018-12-13 – 2018-12-15 (×5): 2 [IU] via SUBCUTANEOUS
  Administered 2018-12-15 – 2018-12-16 (×2): 1 [IU] via SUBCUTANEOUS
  Administered 2018-12-16: 2 [IU] via SUBCUTANEOUS
  Administered 2018-12-17: 1 [IU] via SUBCUTANEOUS
  Administered 2018-12-17 – 2018-12-18 (×2): 2 [IU] via SUBCUTANEOUS
  Administered 2018-12-18: 1 [IU] via SUBCUTANEOUS

## 2018-12-07 NOTE — Progress Notes (Signed)
PROGRESS NOTE  Natalie Thomas:[id number] DOB: 1961-03-02 DOA: 12/03/2018 PCP: System, Pcp Not In   LOS: 4 days   Patient is from: [facility]  Brief Narrative / Interim history: 58 year old female with history of COPD on 2 L, diastolic CHF, hypertension, hyperlipidemia, chronic hyponatremia, polycythemia, depression and schizoaffective disorder admitted with acute on chronic respiratory failure with desaturation to 60% on room air.  CXR showed bibasilar consolidation thought to be due to H CAP.  ABG with chronic respiratory acidosis.  Intubated on 6/10 and admitted to ICU.  Started on broad-spectrum antibiotic.  COVID-19 and blood cultures negative.  Urine showed grew E. coli.  Patient was extubated on 12/04/2018.  TRH assumed care on 12/06/2018.  Subjective: No major events overnight of this morning.  Complains about productive cough.  Denies chest pain.  Breathing improved.  She says she has not had a bowel movement in a while.  She also complains generalized pain.  On Percocet at home which was de-escalated this admission due to respiratory failure.  Assessment & Plan: Acute on chronic hypoxemic and hypercarbic respiratory failure due to COPD exacerbation and possible CHF exacerbation. Repeat ABG on 6/13 reassuring.  Blood and sputum cultures negative.  COVID-19 negative.  This is improving.  She is now saturating in low 90s on 4 L by nasal cannula. -Procalcitonin is negligible which makes bacterial pneumonia unlikely. -COVID-19 and blood cultures negative. -ETT 6/10-6/11. -Vanc and cefepime 6/10 > 6/11 -Ceftriaxone 6/12-she also have E. coli UTI. -Systemic steroids 6/10--escalated to Solu-Medrol 6/13 -Changed Coreg to metoprolol for beta-1 selectivity. -Continue Brovana, budesonide and DuoNeb.  -Decreased diazepam 2 mg twice daily 6/13 due to somnolence. -Continue weaning oxygen  Acute metabolic encephalopathy: Multifactorial including acute respiratory failure,  UTI and medications.  Thyroid panel consistent with euthyroid syndrome.  Encephalopathy resolved.  She is oriented x4- date. -Reduce Valium to 2 mg twice daily on 6/13 -Treat treatable causes -Frequent reorientation  Acute on chronic diastolic CHF: Echo on 2/58 with EF greater than 65% and no significant structural abnormalities.  Patient was hypertensive on arrival.  CXR with bibasilar opacities.  BNP marginally elevated.  UOP -3.2L/24 hours.  Renal function is stable.  Respiratory status improved. -Continue IV Lasix 40 mg twice daily.  -Daily weight, intake output and renal functions -Closely monitor electrolytes and replenish aggressively  E. coli UTI: She denies dysuria today.  No fever or leukocytosis.  -We will continue ceftriaxone as above-last dose 6/15  Chronic hypochloremic hyponatremia: Sodium 118 on arrival.  128 today which is baseline. -Continue monitoring  Depression/anxiety/schizoaffective disorder  -Continue home Depakote, levels were low. -Continue home carcinoma pain, trazodone, Latuda and Paxil -Diazepam reduced.   Chronic pain/chronic opiate use -Scheduled Tylenol 650 mg every 8 hours -Resume gabapentin at 300 mg nightly -Increase oxygen to 5 mg every 8 hours as needed  Essential hypertension: Vented.  On Procardia, labetalol, and hydralazine at home. -Metoprolol 12.5 mg twice daily-beta-1 selective. -IV Lasix as above -Resumed home Procardia  GERD/constipation: -PPI -Scheduled MiraLAX x3 until she has bowel movement.  Inadequate oral intake related to acute illness -Appreciate nutrition input-Magic cup and supplements  Scheduled Meds: . arformoterol  15 mcg Nebulization BID  . asenapine  5 mg Sublingual BID  . aspirin  81 mg Per Tube Daily  . budesonide (PULMICORT) nebulizer solution  0.5 mg Nebulization BID  . buPROPion  150 mg Oral Daily  . Chlorhexidine Gluconate Cloth  6 each Topical Daily  . diazepam  2  mg Oral BID  . furosemide  40 mg  Intravenous BID  . heparin  5,000 Units Subcutaneous Q8H  . hydrALAZINE  100 mg Oral Q8H  . ipratropium-albuterol  3 mL Nebulization Q6H  . lurasidone  40 mg Oral BID  . mouth rinse  15 mL Mouth Rinse BID  . methylPREDNISolone (SOLU-MEDROL) injection  80 mg Intravenous Q8H  . metoprolol tartrate  12.5 mg Oral BID  . mupirocin ointment  1 application Nasal BID  . nicotine  21 mg Transdermal Daily  . NIFEdipine  90 mg Oral Daily  . oxyCODONE  5 mg Oral Daily  . pantoprazole  40 mg Oral QHS  . pravastatin  20 mg Per Tube q1800  . traMADol  50 mg Oral Once  . traZODone  150 mg Oral QHS  . valproic acid  500 mg Oral BID   Continuous Infusions: . cefTRIAXone (ROCEPHIN)  IV 2 g (12/06/18 1103)   PRN Meds:.albuterol, calcium carbonate, guaiFENesin-dextromethorphan   DVT prophylaxis: Subcu heparin Code Status: Full code Family Communication: Guardian did not pick up the form.  Updated patient's brother. Disposition Plan: Remains inpatient for acute on chronic respiratory failure/OPD exacerbation/CHF exacerbation/UTI.  Still with high oxygen requirement than baseline.  On IV Solu-Medrol/IV Lasix/IV ceftriaxone.  Consultants:   PCCM  Procedures:   ETT 6/10 on 6/11  Microbiology: . Blood cultures negative . COVID-19 negative . Urine culture E. Coli . Sputum culture-normal flora.  Antimicrobials: Anti-infectives (From admission, onward)   Start     Dose/Rate Route Frequency Ordered Stop   12/05/18 1000  cefTRIAXone (ROCEPHIN) 2 g in sodium chloride 0.9 % 100 mL IVPB     2 g 200 mL/hr over 30 Minutes Intravenous Every 24 hours 12/05/18 0814 12/08/18 0959   12/04/18 1800  vancomycin (VANCOCIN) 1,250 mg in sodium chloride 0.9 % 250 mL IVPB  Status:  Discontinued     1,250 mg 166.7 mL/hr over 90 Minutes Intravenous Every 24 hours 12/03/18 1744 12/05/18 1131   12/03/18 1715  vancomycin (VANCOCIN) 1,500 mg in sodium chloride 0.9 % 500 mL IVPB     1,500 mg 250 mL/hr over 120 Minutes  Intravenous  Once 12/03/18 1709 12/03/18 2037   12/03/18 1715  ceFEPIme (MAXIPIME) 2 g in sodium chloride 0.9 % 100 mL IVPB  Status:  Discontinued     2 g 200 mL/hr over 30 Minutes Intravenous Every 8 hours 12/03/18 1709 12/05/18 0814      Objective: Vitals:   12/07/18 0132 12/07/18 0300 12/07/18 0548 12/07/18 0600  BP:  (!) 143/46 (!) 176/58 (!) 160/47  Pulse: 77 69    Resp: (!) 22 14    Temp:  98.3 F (36.8 C)    TempSrc:  Axillary    SpO2: 91% 92%    Weight:  87.7 kg    Height:        Intake/Output Summary (Last 24 hours) at 12/07/2018 0648 Last data filed at 12/07/2018 0300 Gross per 24 hour  Intake 120 ml  Output 3250 ml  Net -3130 ml   Filed Weights   12/05/18 0500 12/06/18 0300 12/07/18 0300  Weight: 87.9 kg 92.7 kg 87.7 kg    Examination:  GENERAL: Sitting on bed and eating breakfast. HEENT: MMM.  Vision and hearing grossly intact.  NECK: Supple.  No apparent JVD LUNGS: Saturating in low 90s on 4 L.  Fair aeration bilaterally.  Rhonchi bilaterally. HEART:  RRR. Heart sounds normal.  ABD: Bowel sounds present. Soft. Non tender.  MSK/EXT:  Moves all extremities. No apparent deformity. No edema bilaterally.  SKIN: no apparent skin lesion or wound NEURO: Awake, alert and oriented x4- date.  No gross deficit.  PSYCH: Calm. Normal affect.  Data Reviewed: I have independently reviewed following labs and imaging studies  CBC: Recent Labs  Lab 12/03/18 0957  12/03/18 1420 12/03/18 1630 12/03/18 1850 12/04/18 0451 12/05/18 0606  WBC 5.3  --   --   --   --  6.4 7.4  NEUTROABS 4.2  --   --   --   --   --   --   HGB 13.8   < > 17.0* 15.0 16.3* 13.4 13.3  HCT 41.1   < > 50.0* 44.0 48.0* 39.4 39.7  MCV 92.2  --   --   --   --  90.8 92.1  PLT 153  --   --   --   --  123* 152   < > = values in this interval not displayed.   Basic Metabolic Panel: Recent Labs  Lab 12/03/18 0957  12/03/18 1630 12/03/18 1850 12/04/18 0451 12/05/18 0606 12/06/18 0911  NA  118*   < > 118* 118* 122* 129* 129*  K 4.3   < > 3.8 4.5 3.9 3.2* 3.7  CL 78*  --   --   --  84* 84* 82*  CO2 30  --   --   --  29 35* 34*  GLUCOSE 135*  --   --   --  93 92 131*  BUN 5*  --   --   --  8 5* 6  CREATININE 0.47  --   --   --  0.53 0.36* 0.44  CALCIUM 8.3*  --   --   --  8.1* 8.3* 9.0  MG  --   --   --   --  1.5* 1.7 1.7  PHOS  --   --   --   --  3.5 4.5  --    < > = values in this interval not displayed.   GFR: Estimated Creatinine Clearance: 81.5 mL/min (by C-G formula based on SCr of 0.44 mg/dL). Liver Function Tests: Recent Labs  Lab 12/03/18 0957  AST 15  ALT 10  ALKPHOS 63  BILITOT 0.5  PROT 6.1*  ALBUMIN 2.6*   No results for input(s): LIPASE, AMYLASE in the last 168 hours. No results for input(s): AMMONIA in the last 168 hours. Coagulation Profile: No results for input(s): INR, PROTIME in the last 168 hours. Cardiac Enzymes: Recent Labs  Lab 12/03/18 1830  TROPONINI <0.03   BNP (last 3 results) No results for input(s): PROBNP in the last 8760 hours. HbA1C: No results for input(s): HGBA1C in the last 72 hours. CBG: Recent Labs  Lab 12/06/18 1147 12/06/18 1714 12/06/18 1948 12/06/18 2359 12/07/18 0355  GLUCAP 140* 181* 196* 204* 199*   Lipid Profile: No results for input(s): CHOL, HDL, LDLCALC, TRIG, CHOLHDL, LDLDIRECT in the last 72 hours. Thyroid Function Tests: Recent Labs    12/06/18 1505  TSH 0.241*  FREET4 1.15*   Anemia Panel: No results for input(s): VITAMINB12, FOLATE, FERRITIN, TIBC, IRON, RETICCTPCT in the last 72 hours. Urine analysis:    Component Value Date/Time   COLORURINE YELLOW 12/03/2018 1930   APPEARANCEUR CLEAR 12/03/2018 1930   APPEARANCEUR Clear 06/22/2012 0618   LABSPEC 1.008 12/03/2018 1930   LABSPEC 1.003 06/22/2012 0618   PHURINE 6.0 12/03/2018 1930   GLUCOSEU NEGATIVE 12/03/2018  1930   GLUCOSEU Negative 06/22/2012 0618   GLUCOSEU NEGATIVE 04/09/2011 [place] 12/03/2018 1930    BILIRUBINUR NEGATIVE 12/03/2018 1930   BILIRUBINUR Negative 06/22/2012 0618   Circle NEGATIVE 12/03/2018 1930   PROTEINUR NEGATIVE 12/03/2018 1930   UROBILINOGEN 1.0 08/18/2011 1805   NITRITE NEGATIVE 12/03/2018 1930   LEUKOCYTESUR TRACE (A) 12/03/2018 1930   LEUKOCYTESUR Trace 06/22/2012 0618   Sepsis Labs: Invalid input(s): PROCALCITONIN, LACTICIDVEN  Recent Results (from the past 240 hour(s))  Novel Coronavirus,NAA,(SEND-OUT TO REF LAB - TAT 24-48 hrs); Hosp Order     Status: None   Collection Time: 12/03/18 10:19 AM   Specimen: Nasopharyngeal Swab; Respiratory  Result Value Ref Range Status   SARS-CoV-2, NAA NOT DETECTED NOT DETECTED Final    Comment: (NOTE) This test was developed and its performance characteristics determined by [employer]. This test has not been FDA cleared or approved. This test has been authorized by FDA under an Emergency Use Authorization (EUA). This test is only authorized for the duration of time the declaration that circumstances exist justifying the authorization of the emergency use of in vitro diagnostic tests for detection of SARS-CoV-2 virus and/or diagnosis of COVID-19 infection under section 564(b)(1) of the Act, 21 U.S.C. 671IWP-8(K)(9), unless the authorization is terminated or revoked sooner. When diagnostic testing is negative, the possibility of a false negative result should be considered in the context of a patient's recent exposures and the presence of clinical signs and symptoms consistent with COVID-19. An individual without symptoms of COVID-19 and who is not shedding SARS-CoV-2 virus would expect to have a negative (not detected) result in this assay. Performed  At: [facility] [address] [place], [place] [id number] Rush Farmer MD LZ:[id number]    [place]  Final    Comment: Performed at [facility], [place] [address] [place], [place] [address]  SARS Coronavirus 2      Status: None   Collection Time: 12/03/18  4:18 PM  Result Value Ref Range Status   SARS Coronavirus 2 NOT DETECTED NOT DETECTED Final    Comment: (NOTE) SARS-CoV-2 target nucleic acids are NOT DETECTED. The SARS-CoV-2 RNA is generally detectable in upper and lower respiratory specimens during the acute phase of infection.  Negative  results do not preclude SARS-CoV-2 infection, do not rule out co-infections with other pathogens, and should not be used as the sole basis for treatment or other patient management decisions.  Negative results must be combined with clinical observations, patient history, and epidemiological information. The expected result is Not Detected. Fact Sheet for Patients: http://www.biofiredefense.com/wp-content/uploads/2020/03/BIOFIRE-COVID -19-patients.pdf Fact Sheet for Healthcare Providers: http://www.biofiredefense.com/wp-content/uploads/2020/03/BIOFIRE-COVID -19-hcp.pdf This test is not yet approved or cleared by the [place] and  has been authorized for detection and/or diagnosis of SARS-CoV-2 by FDA under an Emergency Use Authorization (EUA).  This EUA will remain in effec t (meaning this test can be used) for the duration of  the COVID-19 declaration under Section 564(b)(1) of the Act, 21 U.S.C. section 360bbb-3(b)(1), unless the authorization is terminated or revoked sooner. Performed at [facility], [place] [address], [place],  [address]   Culture, blood (routine x 2)     Status: None (Preliminary result)   Collection Time: 12/03/18  6:15 PM   Specimen: BLOOD  Result Value Ref Range Status   Specimen Description BLOOD SITE NOT SPECIFIED  Final   Special Requests   Final    BOTTLES DRAWN AEROBIC AND ANAEROBIC Blood Culture  results may not be optimal due to an inadequate volume of blood received in culture bottles   Culture   Final    NO GROWTH 3 DAYS Performed at [facility], [place] [address], [place], [place] [address]     Report Status PENDING  Incomplete  Culture, blood (routine x 2)     Status: None (Preliminary result)   Collection Time: 12/03/18  6:24 PM   Specimen: BLOOD  Result Value Ref Range Status   Specimen Description BLOOD SITE NOT SPECIFIED  Final   Special Requests   Final    BOTTLES DRAWN AEROBIC AND ANAEROBIC Blood Culture results may not be optimal due to an inadequate volume of blood received in culture bottles   Culture   Final    NO GROWTH 3 DAYS Performed at [facility], [place] [address], [place], [place] [address]    Report Status PENDING  Incomplete  Urine culture     Status: Abnormal   Collection Time: 12/03/18  7:50 PM   Specimen: Urine, Random  Result Value Ref Range Status   Specimen Description URINE, RANDOM  Final   Special Requests   Final    NONE Performed at [facility], [place] [address], [place], [place] [address]    Culture >=100,000 COLONIES/mL ESCHERICHIA COLI (A)  Final   Report Status 12/05/2018 FINAL  Final   Organism ID, Bacteria ESCHERICHIA COLI (A)  Final      Susceptibility   Escherichia coli - MIC*    AMPICILLIN >=32 RESISTANT Resistant     CEFAZOLIN 16 SENSITIVE Sensitive     CEFTRIAXONE <=1 SENSITIVE Sensitive     CIPROFLOXACIN >=4 RESISTANT Resistant     GENTAMICIN >=16 RESISTANT Resistant     IMIPENEM <=0.25 SENSITIVE Sensitive     NITROFURANTOIN <=16 SENSITIVE Sensitive     TRIMETH/SULFA <=20 SENSITIVE Sensitive     AMPICILLIN/SULBACTAM >=32 RESISTANT Resistant     PIP/TAZO 8 SENSITIVE Sensitive     Extended ESBL NEGATIVE Sensitive     * >=100,000 COLONIES/mL ESCHERICHIA COLI  MRSA PCR Screening     Status: Abnormal   Collection Time: 12/03/18  8:27 PM   Specimen: Nasal Mucosa; Nasopharyngeal  Result Value Ref Range Status   MRSA by PCR POSITIVE (A) NEGATIVE Final    Comment:        The GeneXpert MRSA Assay (FDA approved for NASAL specimens only), is one component of a comprehensive MRSA colonization surveillance program. It  is not intended to diagnose MRSA infection nor to guide or monitor treatment for MRSA infections. RESULT CALLED TO, READ BACK BY AND VERIFIED WITH: PARRISH,J RN 12/03/2018 AT 2339 SKEEN,P Performed at [facility], [place] [address] [place], [place] [address]   Culture, respiratory (non-expectorated)     Status: None   Collection Time: 12/04/18  9:04 AM   Specimen: Tracheal Aspirate; Respiratory  Result Value Ref Range Status   Specimen Description TRACHEAL ASPIRATE  Final   Special Requests NONE  Final   Gram Stain   Final    RARE WBC PRESENT, PREDOMINANTLY PMN RARE GRAM POSITIVE COCCI    Culture   Final    RARE Consistent with normal respiratory flora. Performed at [facility], [place] [address] [place], [place] [address]    Report Status 12/06/2018 FINAL  Final      Radiology Studies: No results found.  35 minutes with more than 50% spent in reviewing records, counseling patient and  coordinating care.  Thresea Doble T. [facility] Pager [phone]  If 7PM-7AM, please contact night-coverage [email or web address] Password [email or web address] 12/07/2018, 6:48 AM

## 2018-12-07 NOTE — Plan of Care (Signed)
  Problem: Clinical Measurements: Goal: Respiratory complications will improve Outcome: Not Progressing  Dyspnea with coughing, pt refusing full face mask, drops sats with coughing.    Problem: Nutrition: Goal: Adequate nutrition will be maintained Outcome: Progressing

## 2018-12-07 NOTE — Progress Notes (Signed)
OT Cancellation Note  Patient Details Name: Natalie Thomas MRN: [id number] DOB: [birth year]   Cancelled Treatment:    Reason Eval/Treat Not Completed: Patient declined, no reason specified returned to re-attempt evaluation. Pt adamantly declined working with therapy this date, requested we "cancel for today" because she "didn't sleep at all and feels awful". OT will return later to re-attempt evaluation if time allows and pt is appropriate.   Dorinda Hill OTR/L Acute Rehabilitation Services Office: [place] 12/07/2018, 11:28 AM

## 2018-12-07 NOTE — Progress Notes (Signed)
OT Cancellation Note  Patient Details Name: Natalie Thomas MRN: [id number] DOB: [birth year]   Cancelled Treatment:    Reason Eval/Treat Not Completed: Patient declined, no reason specified. Pt declined OT session at this time, due to difficult night and pt wanting to eat breakfast and rest in bed. OT will return later if time allows and pt is appropriate.   Dorinda Hill OTR/L Acute Rehabilitation Services Office: [phone]   [id number] P [id number] 12/07/2018, 9:00 AM

## 2018-12-08 LAB — GLUCOSE, CAPILLARY
Glucose-Capillary: 194 mg/dL — ABNORMAL HIGH (ref 70–99)
Glucose-Capillary: 200 mg/dL — ABNORMAL HIGH (ref 70–99)
Glucose-Capillary: 261 mg/dL — ABNORMAL HIGH (ref 70–99)
Glucose-Capillary: 250 mg/dL — ABNORMAL HIGH (ref 70–99)
Glucose-Capillary: 175 mg/dL — ABNORMAL HIGH (ref 70–99)
Glucose-Capillary: 152 mg/dL — ABNORMAL HIGH (ref 70–99)
Glucose-Capillary: 261 mg/dL — ABNORMAL HIGH (ref 70–99)
Glucose-Capillary: 155 mg/dL — ABNORMAL HIGH (ref 70–99)

## 2018-12-08 LAB — BASIC METABOLIC PANEL
Anion gap: 13 (ref 5–15)
BUN: 21 mg/dL — ABNORMAL HIGH (ref 6–20)
CO2: 34 mmol/L — ABNORMAL HIGH (ref 22–32)
Calcium: 8.7 mg/dL — ABNORMAL LOW (ref 8.9–10.3)
Chloride: 76 mmol/L — ABNORMAL LOW (ref 98–111)
Creatinine, Ser: 0.56 mg/dL (ref 0.44–1.00)
GFR calc Af Amer: >60 mL/min (ref >60)
GFR calc non Af Amer: >60 mL/min (ref >60)
Glucose, Bld: 165 mg/dL — ABNORMAL HIGH (ref 70–99)
Potassium: 4.5 mmol/L (ref 3.5–5.1)
Sodium: 123 mmol/L — ABNORMAL LOW (ref 135–145)

## 2018-12-08 LAB — CBC
HCT: 42.4 % (ref 36.0–46.0)
Hemoglobin: 14.2 g/dL (ref 12.0–15.0)
MCH: 30.4 pg (ref 26.0–34.0)
MCHC: 33.5 g/dL (ref 30.0–36.0)
MCV: 90.8 fL (ref 80.0–100.0)
Platelets: 189 10*3/uL (ref 150–400)
RBC: 4.67 MIL/uL (ref 3.87–5.11)
RDW: 12.7 % (ref 11.5–15.5)
WBC: 9.6 10*3/uL (ref 4.0–10.5)
nRBC: 0 % (ref 0.0–0.2)

## 2018-12-08 LAB — MAGNESIUM: Magnesium: 1.9 mg/dL (ref 1.7–2.4)

## 2018-12-08 MED ORDER — TRAZODONE HCL 50 MG PO TABS
50.0000 mg | ORAL_TABLET | Freq: Every day | ORAL | Status: DC
  Administered 2018-12-08 – 2018-12-17 (×10): 50 mg via ORAL
  Filled 2018-12-08 (×10): qty 1

## 2018-12-08 MED ORDER — WHITE PETROLATUM EX OINT
TOPICAL_OINTMENT | CUTANEOUS | Status: AC
  Administered 2018-12-08: 23:00:00
  Filled 2018-12-08: qty 28.35

## 2018-12-08 MED ORDER — GABAPENTIN 100 MG PO CAPS
100.0000 mg | ORAL_CAPSULE | Freq: Every day | ORAL | Status: DC
  Administered 2018-12-08 – 2018-12-17 (×10): 100 mg via ORAL
  Filled 2018-12-08 (×10): qty 1

## 2018-12-08 MED ORDER — OXYCODONE HCL 5 MG PO TABS
5.0000 mg | ORAL_TABLET | Freq: Two times a day (BID) | ORAL | Status: DC | PRN
  Administered 2018-12-08 – 2018-12-15 (×11): 5 mg via ORAL
  Filled 2018-12-08 (×11): qty 1

## 2018-12-08 MED ORDER — METHYLPREDNISOLONE SODIUM SUCC 125 MG IJ SOLR
80.0000 mg | Freq: Two times a day (BID) | INTRAMUSCULAR | Status: DC
  Administered 2018-12-08 – 2018-12-09 (×2): 80 mg via INTRAVENOUS
  Filled 2018-12-08 (×2): qty 2

## 2018-12-08 MED ORDER — GUAIFENESIN ER 600 MG PO TB12
600.0000 mg | ORAL_TABLET | Freq: Two times a day (BID) | ORAL | Status: DC
  Administered 2018-12-08 – 2018-12-18 (×21): 600 mg via ORAL
  Filled 2018-12-08 (×20): qty 1

## 2018-12-08 NOTE — Progress Notes (Signed)
PROGRESS NOTE  Natalie Thomas IZT:[id number] DOB: 01/30/1961 DOA: 12/03/2018 PCP: System, Pcp Not In   LOS: 5 days   Patient is from: [place] [facility]  Brief Narrative / Interim history: 58 year old female with history of COPD on 2 L, diastolic CHF, hypertension, hyperlipidemia, chronic hyponatremia, polycythemia, depression and schizoaffective disorder admitted with acute on chronic respiratory failure with desaturation to 60% on room air.  CXR showed bibasilar consolidation thought to be due to H CAP.  ABG with chronic respiratory acidosis.  Intubated on 6/10 and admitted to ICU.  Started on broad-spectrum antibiotic.  COVID-19 and blood cultures negative.  Urine showed grew E. coli.  Patient was extubated on 12/04/2018.  TRH assumed care on 12/06/2018.  Subjective: Somewhat somnolent this morning.  Briefly wakes to voice and falls asleep.  Does not appear to be in distress.  Saturating 94% on 4 L by nasal cannula.  Sodium dropped from 129-123.  Assessment & Plan: Acute on chronic hypoxemic and hypercarbic respiratory failure due to COPD exacerbation and possible CHF exacerbation. Repeat ABG on 6/13 reassuring.  Blood and sputum cultures negative.  COVID-19 negative.  This is improving.  She is now saturating in low 90s on 4 L by nasal cannula. -Procalcitonin is negligible which makes bacterial pneumonia unlikely. -COVID-19 and blood cultures negative. -ETT 6/10-6/11. -Vanc and cefepime 6/10 > 6/11 -Ceftriaxone 6/12-she also have E. coli UTI. -Systemic steroids 6/10-- -Changed Coreg to metoprolol for beta-1 selectivity. -Continue Brovana, budesonide and DuoNeb.  -Decreased diazepam 2 mg twice daily 6/13 due to somnolence. -Continue weaning oxygen  Acute metabolic encephalopathy: Multifactorial including acute respiratory failure, UTI and medications.  Thyroid panel consistent with euthyroid syndrome.  She is somnolent this morning. -Reduce Valium to 2 mg twice daily on  6/13 -Decrease nightly gabapentin to 100 mg -Decrease trazodone to 50 mg -Changed Mucinex DM to plain Mucinex -Reduced oxycodone to 5 mg from q8 to q12 prn -Treat treatable causes -Frequent reorientation  Acute on chronic diastolic CHF: Echo on 3/82 with EF greater than 65% and no significant structural abnormalities.  Patient was hypertensive on arrival.  CXR with bibasilar opacities.  BNP marginally elevated.  UOP -3.2L/24 hours.  Renal function is stable.  Hyponatremia worse.  Respiratory status improved. -Hold IV Lasix after morning dose -Daily weight, intake output and renal functions -Closely monitor electrolytes and replenish aggressively  E. coli UTI: She denies dysuria today.  No fever or leukocytosis.  -Completed antibiotic course with ceftriaxone.  Chronic hypochloremic hyponatremia: Sodium 118 on arrival.  Sodium dropped from 128-123 -Hold Lasix as above -Recheck in the morning  Depression/anxiety/schizoaffective disorder  -Continue home Depakote, levels were low. -Continue home carcinoma pain, Latuda and Paxil -Reduced Valium, gabapentin and trazodone  Chronic pain/chronic opiate use -Scheduled Tylenol 650 mg every 8 hours -Gabapentin and oxycodone as above in the setting of somnolence.  Essential hypertension: Vented.  On Procardia, labetalol, and hydralazine at home. -Metoprolol 12.5 mg twice daily-beta-1 selective. -Resumed home Procardia  GERD/constipation: -PPI -Scheduled MiraLAX x3 until she has bowel movement.  Inadequate oral intake related to acute illness -Appreciate nutrition input-Magic cup and supplements  Scheduled Meds: . acetaminophen  650 mg Oral Q8H  . arformoterol  15 mcg Nebulization BID  . asenapine  5 mg Sublingual BID  . aspirin  81 mg Per Tube Daily  . budesonide (PULMICORT) nebulizer solution  0.5 mg Nebulization BID  . buPROPion  150 mg Oral Daily  . Chlorhexidine Gluconate Cloth  6 each Topical Daily  .  diazepam  2 mg Oral BID   . furosemide  40 mg Intravenous BID  . gabapentin  100 mg Oral QHS  . guaiFENesin  600 mg Oral BID  . heparin  5,000 Units Subcutaneous Q8H  . hydrALAZINE  100 mg Oral Q8H  . insulin aspart  0-5 Units Subcutaneous QHS  . insulin aspart  0-9 Units Subcutaneous TID WC  . ipratropium-albuterol  3 mL Nebulization TID  . lurasidone  40 mg Oral BID  . mouth rinse  15 mL Mouth Rinse BID  . methylPREDNISolone (SOLU-MEDROL) injection  80 mg Intravenous Q8H  . metoprolol tartrate  12.5 mg Oral BID  . nicotine  21 mg Transdermal Daily  . NIFEdipine  90 mg Oral Daily  . pantoprazole  40 mg Oral QHS  . pravastatin  20 mg Per Tube q1800  . traZODone  50 mg Oral QHS  . valproic acid  500 mg Oral BID   Continuous Infusions: . sodium chloride 250 mL (12/07/18 0943)   PRN Meds:.sodium chloride, albuterol, calcium carbonate, oxyCODONE   DVT prophylaxis: Subcu heparin Code Status: Full code Family Communication: Guardian did not pick up the form.  Updated patient's brother. Disposition Plan: Remains inpatient for acute on chronic respiratory failure/OPD exacerbation and acute toxic metabolic encephalopathy.  Continues to require higher than baseline oxygen.  Consultants:   PCCM  Procedures:   ETT 6/10 on 6/11  Microbiology: . Blood cultures negative . COVID-19 negative . Urine culture E. Coli . Sputum culture-normal flora.  Antimicrobials: Anti-infectives (From admission, onward)   Start     Dose/Rate Route Frequency Ordered Stop   12/05/18 1000  cefTRIAXone (ROCEPHIN) 2 g in sodium chloride 0.9 % 100 mL IVPB     2 g 200 mL/hr over 30 Minutes Intravenous Every 24 hours 12/05/18 0814 12/07/18 1015   12/04/18 1800  vancomycin (VANCOCIN) 1,250 mg in sodium chloride 0.9 % 250 mL IVPB  Status:  Discontinued     1,250 mg 166.7 mL/hr over 90 Minutes Intravenous Every 24 hours 12/03/18 1744 12/05/18 1131   12/03/18 1715  vancomycin (VANCOCIN) 1,500 mg in sodium chloride 0.9 % 500 mL IVPB      1,500 mg 250 mL/hr over 120 Minutes Intravenous  Once 12/03/18 1709 12/03/18 2037   12/03/18 1715  ceFEPIme (MAXIPIME) 2 g in sodium chloride 0.9 % 100 mL IVPB  Status:  Discontinued     2 g 200 mL/hr over 30 Minutes Intravenous Every 8 hours 12/03/18 1709 12/05/18 0814      Objective: Vitals:   12/08/18 0300 12/08/18 0455 12/08/18 0735 12/08/18 0802  BP: (!) 157/43 (!) 157/43 (!) 177/59 (!) 177/59  Pulse: (!) 56  63 72  Resp: 12  13 13   Temp: (!) 97.5 F (36.4 C)     TempSrc: Oral     SpO2: 92%  96% 97%  Weight: 90.1 kg     Height:        Intake/Output Summary (Last 24 hours) at 12/08/2018 1054 Last data filed at 12/08/2018 0700 Gross per 24 hour  Intake 1321.06 ml  Output 3200 ml  Net -1878.94 ml   Filed Weights   12/06/18 0300 12/07/18 0300 12/08/18 0300  Weight: 92.7 kg 87.7 kg 90.1 kg    Examination:  GENERAL: No acute distress.  Somnolent.  Arouses to voice but falls back to sleep quickly. HEENT: MMM.  Vision and hearing grossly intact.  NECK: Supple.  No apparent JVD. LUNGS: 94% on 4 L by [place].  No IWOB.  Fair aeration bilaterally.  Rhonchi bilaterally. HEART:  RRR. Heart sounds normal.  ABD: Bowel sounds present. Soft. Non tender.  MSK/EXT:  Moves all extremities. No apparent deformity. No edema bilaterally.  SKIN: no apparent skin lesion or wound NEURO: Somnolent but arises to voice and falls back to sleep quickly.  No gross deficit.  PSYCH: Somnolent  Data Reviewed: I have independently reviewed following labs and imaging studies  CBC: Recent Labs  Lab 12/03/18 0957  12/03/18 1850 12/04/18 0451 12/05/18 0606 12/07/18 0604 12/08/18 0621  WBC 5.3  --   --  6.4 7.4 7.7 9.6  NEUTROABS 4.2  --   --   --   --   --   --   HGB 13.8   < > 16.3* 13.4 13.3 13.6 14.2  HCT 41.1   < > 48.0* 39.4 39.7 41.4 42.4  MCV 92.2  --   --  90.8 92.1 92.8 90.8  PLT 153  --   --  123* 152 177 189   < > = values in this interval not displayed.   Basic Metabolic Panel:  Recent Labs  Lab 12/04/18 0451 12/05/18 0606 12/06/18 0911 12/07/18 0604 12/08/18 0621  NA 122* 129* 129* 128* 123*  K 3.9 3.2* 3.7 3.8 4.5  CL 84* 84* 82* 78* 76*  CO2 29 35* 34* 37* 34*  GLUCOSE 93 92 131* 196* 165*  BUN 8 5* 6 14 21*  CREATININE 0.53 0.36* 0.44 0.52 0.56  CALCIUM 8.1* 8.3* 9.0 8.9 8.7*  MG 1.5* 1.7 1.7 2.0 1.9  PHOS 3.5 4.5  --   --   --    GFR: Estimated Creatinine Clearance: 82.7 mL/min (by C-G formula based on SCr of 0.56 mg/dL). Liver Function Tests: Recent Labs  Lab 12/03/18 0957  AST 15  ALT 10  ALKPHOS 63  BILITOT 0.5  PROT 6.1*  ALBUMIN 2.6*   No results for input(s): LIPASE, AMYLASE in the last 168 hours. No results for input(s): AMMONIA in the last 168 hours. Coagulation Profile: No results for input(s): INR, PROTIME in the last 168 hours. Cardiac Enzymes: Recent Labs  Lab 12/03/18 1830  TROPONINI <0.03   BNP (last 3 results) No results for input(s): PROBNP in the last 8760 hours. HbA1C: Recent Labs    12/07/18 0735  HGBA1C 5.2   CBG: Recent Labs  Lab 12/06/18 1147 12/06/18 1714 12/06/18 1948 12/06/18 2359 12/07/18 0355  GLUCAP 140* 181* 196* 204* 199*   Lipid Profile: No results for input(s): CHOL, HDL, LDLCALC, TRIG, CHOLHDL, LDLDIRECT in the last 72 hours. Thyroid Function Tests: Recent Labs    12/06/18 1505  TSH 0.241*  FREET4 1.15*   Anemia Panel: No results for input(s): VITAMINB12, FOLATE, FERRITIN, TIBC, IRON, RETICCTPCT in the last 72 hours. Urine analysis:    Component Value Date/Time   COLORURINE YELLOW 12/03/2018 1930   APPEARANCEUR CLEAR 12/03/2018 1930   APPEARANCEUR Clear 06/22/2012 0618   LABSPEC 1.008 12/03/2018 1930   LABSPEC 1.003 06/22/2012 0618   PHURINE 6.0 12/03/2018 1930   GLUCOSEU NEGATIVE 12/03/2018 1930   GLUCOSEU Negative 06/22/2012 0618   GLUCOSEU NEGATIVE 04/09/2011 [place] 12/03/2018 1930   BILIRUBINUR NEGATIVE 12/03/2018 1930   BILIRUBINUR Negative  06/22/2012 0618   [place] NEGATIVE 12/03/2018 1930   PROTEINUR NEGATIVE 12/03/2018 1930   UROBILINOGEN 1.0 08/18/2011 1805   NITRITE NEGATIVE 12/03/2018 1930   LEUKOCYTESUR TRACE (A) 12/03/2018 1930   LEUKOCYTESUR Trace 06/22/2012 0618  Sepsis Labs: Invalid input(s): PROCALCITONIN, LACTICIDVEN  Recent Results (from the past 240 hour(s))  Novel Coronavirus,NAA,(SEND-OUT TO REF LAB - TAT 24-48 hrs); Hosp Order     Status: None   Collection Time: 12/03/18 10:19 AM   Specimen: Nasopharyngeal Swab; Respiratory  Result Value Ref Range Status   SARS-CoV-2, NAA NOT DETECTED NOT DETECTED Final    Comment: (NOTE) This test was developed and its performance characteristics determined by [employer]. This test has not been FDA cleared or approved. This test has been authorized by FDA under an Emergency Use Authorization (EUA). This test is only authorized for the duration of time the declaration that circumstances exist justifying the authorization of the emergency use of in vitro diagnostic tests for detection of SARS-CoV-2 virus and/or diagnosis of COVID-19 infection under section 564(b)(1) of the Act, 21 U.S.C. 496PRF-1(M)(3), unless the authorization is terminated or revoked sooner. When diagnostic testing is negative, the possibility of a false negative result should be considered in the context of a patient's recent exposures and the presence of clinical signs and symptoms consistent with COVID-19. An individual without symptoms of COVID-19 and who is not shedding SARS-CoV-2 virus would expect to have a negative (not detected) result in this assay. Performed  At: [facility] [address] [place], [place] [id number] Rush Farmer MD TS:[id number]    [place]  Final    Comment: Performed at [facility], [place] [address] [place], [place] [address]  SARS Coronavirus 2     Status: None   Collection Time: 12/03/18  4:18 PM  Result  Value Ref Range Status   SARS Coronavirus 2 NOT DETECTED NOT DETECTED Final    Comment: (NOTE) SARS-CoV-2 target nucleic acids are NOT DETECTED. The SARS-CoV-2 RNA is generally detectable in upper and lower respiratory specimens during the acute phase of infection.  Negative  results do not preclude SARS-CoV-2 infection, do not rule out co-infections with other pathogens, and should not be used as the sole basis for treatment or other patient management decisions.  Negative results must be combined with clinical observations, patient history, and epidemiological information. The expected result is Not Detected. Fact Sheet for Patients: http://www.biofiredefense.com/wp-content/uploads/2020/03/BIOFIRE-COVID -19-patients.pdf Fact Sheet for Healthcare Providers: http://www.biofiredefense.com/wp-content/uploads/2020/03/BIOFIRE-COVID -19-hcp.pdf This test is not yet approved or cleared by the [place] and  has been authorized for detection and/or diagnosis of SARS-CoV-2 by FDA under an Emergency Use Authorization (EUA).  This EUA will remain in effec t (meaning this test can be used) for the duration of  the COVID-19 declaration under Section 564(b)(1) of the Act, 21 U.S.C. section 360bbb-3(b)(1), unless the authorization is terminated or revoked sooner. Performed at [facility], [place] [address], [place], [place] [address]   Culture, blood (routine x 2)     Status: None (Preliminary result)   Collection Time: 12/03/18  6:15 PM   Specimen: BLOOD  Result Value Ref Range Status   Specimen Description BLOOD SITE NOT SPECIFIED  Final   Special Requests   Final    BOTTLES DRAWN AEROBIC AND ANAEROBIC Blood Culture results may not be optimal due to an inadequate volume of blood received in culture bottles   Culture   Final    NO GROWTH 4 DAYS Performed at [facility], [place] [address] [place], [place] [address]    Report Status PENDING  Incomplete  Culture, blood  (routine x 2)     Status: None (Preliminary result)   Collection Time: 12/03/18  6:24 PM   Specimen: BLOOD  Result Value Ref Range Status   Specimen Description BLOOD SITE NOT SPECIFIED  Final   Special Requests   Final    BOTTLES DRAWN AEROBIC AND ANAEROBIC Blood Culture results may not be optimal due to an inadequate volume of blood received in culture bottles   Culture   Final    NO GROWTH 4 DAYS Performed at [facility], [place] [address] [place], [place] [address]    Report Status PENDING  Incomplete  Urine culture     Status: Abnormal   Collection Time: 12/03/18  7:50 PM   Specimen: Urine, Random  Result Value Ref Range Status   Specimen Description URINE, RANDOM  Final   Special Requests   Final    NONE Performed at [facility], [place] [address], [place], [place] [address]    Culture >=100,000 COLONIES/mL ESCHERICHIA COLI (A)  Final   Report Status 12/05/2018 FINAL  Final   Organism ID, Bacteria ESCHERICHIA COLI (A)  Final      Susceptibility   Escherichia coli - MIC*    AMPICILLIN >=32 RESISTANT Resistant     CEFAZOLIN 16 SENSITIVE Sensitive     CEFTRIAXONE <=1 SENSITIVE Sensitive     CIPROFLOXACIN >=4 RESISTANT Resistant     GENTAMICIN >=16 RESISTANT Resistant     IMIPENEM <=0.25 SENSITIVE Sensitive     NITROFURANTOIN <=16 SENSITIVE Sensitive     TRIMETH/SULFA <=20 SENSITIVE Sensitive     AMPICILLIN/SULBACTAM >=32 RESISTANT Resistant     PIP/TAZO 8 SENSITIVE Sensitive     Extended ESBL NEGATIVE Sensitive     * >=100,000 COLONIES/mL ESCHERICHIA COLI  MRSA PCR Screening     Status: Abnormal   Collection Time: 12/03/18  8:27 PM   Specimen: Nasal Mucosa; Nasopharyngeal  Result Value Ref Range Status   MRSA by PCR POSITIVE (A) NEGATIVE Final    Comment:        The GeneXpert MRSA Assay (FDA approved for NASAL specimens only), is one component of a comprehensive MRSA colonization surveillance program. It is not intended to diagnose MRSA infection nor to  guide or monitor treatment for MRSA infections. RESULT CALLED TO, READ BACK BY AND VERIFIED WITH: PARRISH,J RN 12/03/2018 AT 2339 SKEEN,P Performed at [facility], [place] [address] [place], [place] [address]   Culture, respiratory (non-expectorated)     Status: None   Collection Time: 12/04/18  9:04 AM   Specimen: Tracheal Aspirate; Respiratory  Result Value Ref Range Status   Specimen Description TRACHEAL ASPIRATE  Final   Special Requests NONE  Final   Gram Stain   Final    RARE WBC PRESENT, PREDOMINANTLY PMN RARE GRAM POSITIVE COCCI    Culture   Final    RARE Consistent with normal respiratory flora. Performed at [facility], [place] [address], [place], [place] [address]    Report Status 12/06/2018 FINAL  Final      Radiology Studies: No results found.    T. [facility] Pager [phone]  If 7PM-7AM, please contact night-coverage [email or web address] Password [facility] 12/08/2018, 10:54 AM

## 2018-12-08 NOTE — NC FL2 (Addendum)
[place] MEDICAID FL2 LEVEL OF CARE SCREENING TOOL     IDENTIFICATION  Patient Name: Natalie Thomas Birthdate: 10/24/1960 Sex: female Admission Date (Current Location): 12/03/2018  [place] and [place] Number:  Kathleen Argue [id number] [place] and Address:  [facility] [place] [facility], [place] [address] [place], [place] [address]      Provider Number: [id number]  Attending Physician Name and Address:  Mercy Riding, MD  Relative Name and Phone Number:  Carola Rhine, Legal Guardian, [phone]    Current Level of Care: Hospital Recommended Level of Care: [place] Prior Approval Number:    Date Approved/Denied:   PASRR Number:   [id number]  Discharge Plan: SNF    Current Diagnoses: Patient Active Problem List   Diagnosis Date Noted  . Acute hypoxemic respiratory failure ([place]) 12/03/2018  . Irritability 11/09/2018  . Aggressive behavior 11/09/2018  . Agitation   . S/P right hip fracture 11/07/2017  . Closed comminuted intertrochanteric fracture of right femur ([place]) 11/07/2017  . Anxiety and depression   . Dyspnea 10/16/2017  . Anemia 10/16/2017  . COPD exacerbation ([place]) 10/07/2017  . Hyperlipidemia 10/07/2017  . Chronic respiratory failure with hypoxia ([place]) 10/07/2017  . Acute on chronic respiratory failure with hypoxia and hypercapnia ([place]) 10/07/2017  . Incontinence of urine 08/14/2011  . Hyponatremia 07/27/2011  . Schizoaffective disorder, bipolar type ([place]) 07/05/2011  . Cough 05/08/2011  . Encounter for long-term (current) use of other medications 04/09/2011  . Lumbar disc disease 04/09/2011  . Polycythemia secondary to smoking 04/09/2011  . HIP PAIN, LEFT 12/12/2007  . 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 DIABETES MELLITUS, TYPE II 04/08/2007  . MENOPAUSE, PREMATURE 04/08/2007  . Chronic hyponatremia 04/08/2007  . Schizoaffective disorder ([place]) 04/08/2007  . DEPRESSION 04/08/2007  . Essential hypertension 04/08/2007  .  COPD 04/08/2007  . GERD 04/08/2007  . Osteoporosis 04/08/2007  . DYSURIA 04/08/2007    Orientation RESPIRATION BLADDER Height & Weight     Self, Time, Situation, Place  O2(96, , 4L) Incontinent, External catheter Weight: 198 lb 10.2 oz (90.1 kg) Height:  5\' 3"  (160 cm)  BEHAVIORAL SYMPTOMS/MOOD NEUROLOGICAL BOWEL NUTRITION STATUS      Continent Diet(Dys 3, thin liquids)  AMBULATORY STATUS COMMUNICATION OF NEEDS Skin   Limited Assist Verbally Normal, Skin abrasions(abrasion on leg, MASD on abdomen)                       Personal Care Assistance Level of Assistance  Bathing, Feeding, Dressing, Total care Bathing Assistance: Limited assistance Feeding assistance: Independent Dressing Assistance: Limited assistance Total Care Assistance: Limited assistance   Functional Limitations Info  Sight, Speech, Hearing Sight Info: Adequate Hearing Info: Adequate Speech Info: Adequate    SPECIAL CARE FACTORS FREQUENCY  PT (By licensed PT), OT (By licensed OT), Speech therapy     PT Frequency: 5x/wk OT Frequency: 5x/wk     Speech Therapy Frequency: 3x/wk      Contractures Contractures Info: Not present    Additional Factors Info  Code Status, Allergies, Insulin Sliding Scale Code Status Info: Full Code Allergies Info: Clarithromycin, Penicillins   Insulin Sliding Scale Info: insulin aspart novolog 0-5 units at bedtime and insulin aspart novolog 0-9 units 3x daily w/meals       Current Medications (12/08/2018):  This is the current hospital active medication list Current Facility-Administered Medications  Medication Dose Route Frequency Provider Last Rate Last Dose  . 0.9 %  sodium chloride infusion   Intravenous PRN  Mercy Riding, MD 10 mL/hr at 12/07/18 0943 250 mL at 12/07/18 0943  . acetaminophen (TYLENOL) tablet 650 mg  650 mg Oral Q8H Gonfa, Taye T, MD   650 mg at 12/08/18 1336  . albuterol (PROVENTIL) (2.5 MG/3ML) 0.083% nebulizer solution 2.5 mg  2.5 mg  Nebulization Q4H PRN Deterding, Guadelupe Sabin, MD   2.5 mg at 12/05/18 0237  . arformoterol (BROVANA) nebulizer solution 15 mcg  15 mcg Nebulization BID Shearon Stalls, Rahul P, PA-C   15 mcg at 12/08/18 0802  . asenapine (SAPHRIS) sublingual tablet 5 mg  5 mg Sublingual BID Rigoberto Noel, MD   5 mg at 12/08/18 0833  . aspirin chewable tablet 81 mg  81 mg Per Tube Daily Desai, Rahul P, PA-C   81 mg at 12/08/18 0838  . budesonide (PULMICORT) nebulizer solution 0.5 mg  0.5 mg Nebulization BID Shearon Stalls, Rahul P, PA-C   0.5 mg at 12/08/18 0802  . buPROPion (WELLBUTRIN XL) 24 hr tablet 150 mg  150 mg Oral Daily Wendee Beavers T, MD   150 mg at 12/08/18 0835  . calcium carbonate (TUMS - dosed in mg elemental calcium) chewable tablet 200 mg of elemental calcium  1 tablet Oral BID PRN Margaretha Seeds, MD   200 mg of elemental calcium at 12/07/18 0939  . diazepam (VALIUM) tablet 2 mg  2 mg Oral BID Wendee Beavers T, MD   2 mg at 12/08/18 0835  . gabapentin (NEURONTIN) capsule 100 mg  100 mg Oral QHS Gonfa, Taye T, MD      . guaiFENesin (MUCINEX) 12 hr tablet 600 mg  600 mg Oral BID Cyndia Skeeters, Taye T, MD   600 mg at 12/08/18 1059  . heparin injection 5,000 Units  5,000 Units Subcutaneous Q8H Desai, Rahul P, PA-C   5,000 Units at 12/08/18 1335  . hydrALAZINE (APRESOLINE) tablet 100 mg  100 mg Oral Q8H Margaretha Seeds, MD   100 mg at 12/08/18 1336  . insulin aspart (novoLOG) injection 0-5 Units  0-5 Units Subcutaneous QHS Mercy Riding, MD   2 Units at 12/07/18 2217  . insulin aspart (novoLOG) injection 0-9 Units  0-9 Units Subcutaneous TID WC Mercy Riding, MD   2 Units at 12/08/18 1330  . ipratropium-albuterol (DUONEB) 0.5-2.5 (3) MG/3ML nebulizer solution 3 mL  3 mL Nebulization TID Wendee Beavers T, MD   3 mL at 12/08/18 1448  . lurasidone (LATUDA) tablet 40 mg  40 mg Oral BID Rigoberto Noel, MD   40 mg at 12/08/18 0835  . MEDLINE mouth rinse  15 mL Mouth Rinse BID Rush Farmer, MD   15 mL at 12/08/18 0848  .  methylPREDNISolone sodium succinate (SOLU-MEDROL) 125 mg/2 mL injection 80 mg  80 mg Intravenous Q12H Gonfa, Taye T, MD      . metoprolol tartrate (LOPRESSOR) tablet 12.5 mg  12.5 mg Oral BID Wendee Beavers T, MD   12.5 mg at 12/08/18 0832  . nicotine (NICODERM CQ - dosed in mg/24 hours) patch 21 mg  21 mg Transdermal Daily Wendee Beavers T, MD   21 mg at 12/08/18 0840  . NIFEdipine (PROCARDIA-XL/NIFEDICAL-XL) 24 hr tablet 90 mg  90 mg Oral Daily Wendee Beavers T, MD   90 mg at 12/08/18 0837  . oxyCODONE (Oxy IR/ROXICODONE) immediate release tablet 5 mg  5 mg Oral Q12H PRN Wendee Beavers T, MD   5 mg at 12/08/18 1344  . pantoprazole (PROTONIX) EC tablet 40 mg  40 mg Oral QHS Rush Farmer, MD   40 mg at 12/07/18 2143  . pravastatin (PRAVACHOL) tablet 20 mg  20 mg Per Tube q1800 Desai, Rahul P, PA-C   20 mg at 12/07/18 1704  . traZODone (DESYREL) tablet 50 mg  50 mg Oral QHS Gonfa, Taye T, MD      . valproic acid (DEPAKENE) 250 MG capsule 500 mg  500 mg Oral BID Rush Farmer, MD   500 mg at 12/08/18 5329     Discharge Medications: Please see discharge summary for a list of discharge medications.  Relevant Imaging Results:  Relevant Lab Results:   Additional Information SSN: [id number]  [place], LCSWA

## 2018-12-08 NOTE — Progress Notes (Signed)
CSW attempted to complete assessment with the patient's legal guardian, Carola Rhine, APS. Her telephone number is [phone]. The patient will require SNF before she is able to return back to her ALF.   CSW will continue to follow and work the patient up.   Domenic Schwab, MSW, [place]

## 2018-12-08 NOTE — Progress Notes (Signed)
Occupational Therapy Evaluation Patient Details Name: Natalie Thomas MRN: [id number] DOB: 22-Apr-1961 Today's Date: 12/08/2018    History of Present Illness Pt adm from SNF with hypoxic respiratory failure due to copd exacerbation and PNA. PMH - rt hip fx, copd, schizoaffective disorder, htn, dm, chf, lumbar disc disease, pelvic fx   Clinical Impression   PTA, pt was at SNF, and required assistance with ADL/IADL and reports she was working with therapy on walking. Pt on 4lnc SpO2 87-93% throughout session. Educated pt on pursed lip breathing and energy conservation strategies during ADL. Pt currently requires minA for ADL and functional mobility at RW level. Due to decline in current level of function, pt would benefit from acute OT to address established goals to facilitate safe D/C to venue listed below. At this time, recommend SNF follow-up. Will continue to follow acutely.     Follow Up Recommendations  SNF    Equipment Recommendations  3 in 1 bedside commode    Recommendations for Other Services       Precautions / Restrictions Precautions Precautions: Fall Restrictions Weight Bearing Restrictions: No      Mobility Bed Mobility Overal bed mobility: Needs Assistance Bed Mobility: Supine to Sit;Sit to Supine     Supine to sit: Min assist Sit to supine: Min assist   General bed mobility comments: Assist to elevate trunk into sitting and bring hips to EOB. Assist to bring legs back up in the bed to return to supine  Transfers Overall transfer level: Needs assistance Equipment used: Rolling walker (2 wheeled) Transfers: Sit to/from Stand Sit to Stand: Min assist         General transfer comment: Assist to bring hips up and for balance    Balance Overall balance assessment: Needs assistance Sitting-balance support: No upper extremity supported;Feet supported Sitting balance-Leahy Scale: Fair     Standing balance support: Bilateral upper extremity  supported Standing balance-Leahy Scale: Poor Standing balance comment: walker and min assist for static standing                           ADL either performed or assessed with clinical judgement   ADL Overall ADL's : Needs assistance/impaired Eating/Feeding: Set up;Sitting   Grooming: Set up;Sitting   Upper Body Bathing: Minimal assistance;Sitting   Lower Body Bathing: Minimal assistance;Sit to/from stand   Upper Body Dressing : Minimal assistance;Sitting   Lower Body Dressing: Minimal assistance;Sit to/from stand Lower Body Dressing Details (indicate cue type and reason): pt adjusted socks while sitting EOB Toilet Transfer: Minimal assistance;RW;Ambulation Toilet Transfer Details (indicate cue type and reason): simulated sit<>stand from EOB walked around bed to the recliner with minA for stability and safety Toileting- Clothing Manipulation and Hygiene: Minimal assistance;Sit to/from stand       Functional mobility during ADLs: Minimal assistance;Rolling walker General ADL Comments: pt required vc for pursed lip breathing during ADL, SpO2 dipped to 87% when adjusting socks;pt on 4lnc      Vision Patient Visual Report: No change from baseline       Perception     Praxis      Pertinent Vitals/Pain Pain Assessment: 0-10 Pain Score: 5  Pain Location: stomach Pain Descriptors / Indicators: Discomfort;Constant Pain Intervention(s): Monitored during session;Limited activity within patient's tolerance     Hand Dominance Right   Extremity/Trunk Assessment Upper Extremity Assessment Upper Extremity Assessment: Generalized weakness   Lower Extremity Assessment Lower Extremity Assessment: Generalized weakness   Cervical / Trunk  Assessment Cervical / Trunk Assessment: Kyphotic   Communication Communication Communication: No difficulties   Cognition Arousal/Alertness: Awake/alert Behavior During Therapy: WFL for tasks assessed/performed Overall Cognitive  Status: No family/caregiver present to determine baseline cognitive functioning                                     General Comments  pt verbalized understanding to not get up without assistance;pt on 4lnc spO2 87-93% during ADL and functional mobiltiy;educated pt on pursed lip breathing    Exercises     Shoulder Instructions      Home Living Family/patient expects to be discharged to:: Skilled nursing facility                                 Additional Comments: pt reports she was walking with therapy      Prior Functioning/Environment Level of Independence: Needs assistance  Gait / Transfers Assistance Needed: Pt reports she was working on walking but doesn't know how long ago that was ADL's / Homemaking Assistance Needed: needed assistance for ADL             OT Problem List: Decreased strength;Decreased range of motion;Decreased activity tolerance;Impaired balance (sitting and/or standing);Decreased cognition;Decreased safety awareness;Decreased knowledge of use of DME or AE;Cardiopulmonary status limiting activity      OT Treatment/Interventions: Self-care/ADL training;Therapeutic exercise;Energy conservation;DME and/or AE instruction;Therapeutic activities;Patient/family education;Balance training    OT Goals(Current goals can be found in the care plan section) Acute Rehab OT Goals Patient Stated Goal: to be able to walk longer distances OT Goal Formulation: With patient Time For Goal Achievement: 12/22/18 Potential to Achieve Goals: Good ADL Goals Pt Will Perform Grooming: with modified independence;standing;sitting Pt Will Perform Upper Body Dressing: with modified independence Pt Will Perform Lower Body Dressing: with modified independence;sit to/from stand Pt Will Transfer to Toilet: with modified independence;ambulating Pt Will Perform Tub/Shower Transfer: with modified independence;ambulating Additional ADL Goal #1: Pt will  demonstrate use of 3 energy conservation strategies during ADL.  OT Frequency: Min 2X/week   Barriers to D/C:            Co-evaluation              AM-PAC OT "6 Clicks" Daily Activity     Outcome Measure Help from another person eating meals?: None Help from another person taking care of personal grooming?: A Little Help from another person toileting, which includes using toliet, bedpan, or urinal?: A Little Help from another person bathing (including washing, rinsing, drying)?: A Little Help from another person to put on and taking off regular upper body clothing?: A Little Help from another person to put on and taking off regular lower body clothing?: A Little 6 Click Score: 19   End of Session Equipment Utilized During Treatment: Gait belt;Rolling walker;Oxygen Nurse Communication: Mobility status  Activity Tolerance: Patient tolerated treatment well Patient left: in chair;with call bell/phone within reach  OT Visit Diagnosis: Unsteadiness on feet (R26.81);Other abnormalities of gait and mobility (R26.89);Muscle weakness (generalized) (M62.81);Pain Pain - part of body: (stomach)                Time: 6160-7371 OT Time Calculation (min): 29 min Charges:  OT General Charges $OT Visit: 1 Visit OT Evaluation $OT Eval Moderate Complexity: 1 Mod OT Treatments $Self Care/Home Management : 8-22 mins  Dorinda Hill OTR/L  Acute Rehabilitation Services Office: [place] 12/08/2018, 1:21 PM

## 2018-12-08 NOTE — TOC Initial Note (Signed)
Transition of Care [facility]) - Initial/Assessment Note    Patient Details  Name: Natalie Thomas MRN: [id number] Date of Birth: 12-Sep-1960  Transition of Care [facility]) CM/SW Contact:    Gelene Mink, [place] Phone Number: 12/08/2018, 6:10 PM  Clinical Narrative:              CSW spoke with the patient's legal guardian, Carola Rhine, [facility] APS. CSW went over therapy recommendation. CSW obtained permission to fax the patient out to local SNF's and give bed offers to [facility] once they became available.   PASRR is under manual review. CSW will continue to follow and assist with disposition planning.        Expected Discharge Plan: Skilled Nursing Facility Barriers to Discharge: Ship broker, Continued Medical Work up   Patient Goals and CMS Choice   CMS Medicare.gov Compare Post Acute Care list provided to:: Legal Guardian Choice offered to / list presented to : [place] / Guardian  Expected Discharge Plan and Services Expected Discharge Plan: [place] In-house Referral: Clinical Social Work   Post Acute Care Choice: [place] Living arrangements for the past 2 months: [place]                                      Prior Living Arrangements/Services Living arrangements for the past 2 months: [place] Lives with:: Facility Resident Patient language and need for interpreter reviewed:: No Do you feel safe going back to the place where you live?: Yes      Need for Family Participation in Patient Care: Yes (Comment) Care giver support system in place?: Yes (comment)   Criminal Activity/Legal Involvement Pertinent to Current Situation/Hospitalization: No - Comment as needed  Activities of Daily Living Home Assistive Devices/Equipment: Wheelchair, Environmental consultant (specify type) ADL Screening (condition at time of admission) Patient's cognitive ability adequate to safely complete daily activities?:  Yes Is the patient deaf or have difficulty hearing?: No Does the patient have difficulty seeing, even when wearing glasses/contacts?: No Does the patient have difficulty concentrating, remembering, or making decisions?: Yes Patient able to express need for assistance with ADLs?: Yes Does the patient have difficulty dressing or bathing?: Yes Independently performs ADLs?: No Communication: Independent Dressing (OT): Needs assistance Is this a change from baseline?: Pre-admission baseline Grooming: Needs assistance Is this a change from baseline?: Pre-admission baseline Feeding: Independent Bathing: Needs assistance Is this a change from baseline?: Pre-admission baseline Toileting: Needs assistance Is this a change from baseline?: Pre-admission baseline In/Out Bed: Needs assistance Is this a change from baseline?: Pre-admission baseline Walks in Home: Needs assistance Is this a change from baseline?: Pre-admission baseline Does the patient have difficulty walking or climbing stairs?: Yes Weakness of Legs: Both Weakness of Arms/Hands: None  Permission Sought/Granted Permission sought to share information with : Case Manager Permission granted to share information with : Yes, Verbal Permission Granted  Share Information with NAME: Carola Rhine  Permission granted to share info w AGENCY: All SNF  Permission granted to share info w Relationship: Legal Guardian     Emotional Assessment Appearance:: Appears stated age Attitude/Demeanor/Rapport: Unable to Assess Affect (typically observed): Unable to Assess Orientation: : Oriented to Self, Oriented to Place, Oriented to  Time, Oriented to Situation Alcohol / Substance Use: Not Applicable Psych Involvement: No (comment)  Admission diagnosis:  SOB (shortness of breath) [R06.02] Acute congestive heart failure, unspecified heart  failure type [facility]) [I50.9] Patient Active Problem List   Diagnosis Date Noted  . Acute hypoxemic  respiratory failure ([place]) 12/03/2018  . Irritability 11/09/2018  . Aggressive behavior 11/09/2018  . Agitation   . S/P right hip fracture 11/07/2017  . Closed comminuted intertrochanteric fracture of right femur ([place]) 11/07/2017  . Anxiety and depression   . Dyspnea 10/16/2017  . Anemia 10/16/2017  . COPD exacerbation ([place]) 10/07/2017  . Hyperlipidemia 10/07/2017  . Chronic respiratory failure with hypoxia ([place]) 10/07/2017  . Acute on chronic respiratory failure with hypoxia and hypercapnia ([place]) 10/07/2017  . Incontinence of urine 08/14/2011  . Hyponatremia 07/27/2011  . Schizoaffective disorder, bipolar type ([place]) 07/05/2011  . Cough 05/08/2011  . Encounter for long-term (current) use of other medications 04/09/2011  . Lumbar disc disease 04/09/2011  . Polycythemia secondary to smoking 04/09/2011  . HIP PAIN, LEFT 12/12/2007  . 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 DIABETES MELLITUS, TYPE II 04/08/2007  . MENOPAUSE, PREMATURE 04/08/2007  . Chronic hyponatremia 04/08/2007  . Schizoaffective disorder ([place]) 04/08/2007  . DEPRESSION 04/08/2007  . Essential hypertension 04/08/2007  . COPD 04/08/2007  . GERD 04/08/2007  . Osteoporosis 04/08/2007  . DYSURIA 04/08/2007   PCP:  System, Pcp Not In Pharmacy:   [place], [place] [address] Phone: [phone] Fax: [phone]     Social Determinants of Health (SDOH) Interventions    Readmission Risk Interventions Readmission Risk Prevention Plan 12/08/2018  Transportation Screening Complete  Medication Review Press photographer) Complete  PCP or Specialist appointment within 3-5 days of discharge Complete  HRI or Home Care Consult Complete  SW Recovery Care/Counseling Consult Complete  Palliative Care Screening Not [place] Complete  Some recent data might be hidden

## 2018-12-09 DIAGNOSIS — R0602 Shortness of breath: Secondary | ICD-10-CM

## 2018-12-09 LAB — BASIC METABOLIC PANEL
Anion gap: 10 (ref 5–15)
Anion gap: 8 (ref 5–15)
Anion gap: 12 (ref 5–15)
BUN: 24 mg/dL — ABNORMAL HIGH (ref 6–20)
BUN: 21 mg/dL — ABNORMAL HIGH (ref 6–20)
BUN: 19 mg/dL (ref 6–20)
CO2: 32 mmol/L (ref 22–32)
CO2: 34 mmol/L — ABNORMAL HIGH (ref 22–32)
CO2: 28 mmol/L (ref 22–32)
Calcium: 8.3 mg/dL — ABNORMAL LOW (ref 8.9–10.3)
Calcium: 8.1 mg/dL — ABNORMAL LOW (ref 8.9–10.3)
Calcium: 7.9 mg/dL — ABNORMAL LOW (ref 8.9–10.3)
Chloride: 75 mmol/L — ABNORMAL LOW (ref 98–111)
Chloride: 75 mmol/L — ABNORMAL LOW (ref 98–111)
Chloride: 77 mmol/L — ABNORMAL LOW (ref 98–111)
Creatinine, Ser: 0.7 mg/dL (ref 0.44–1.00)
Creatinine, Ser: 0.62 mg/dL (ref 0.44–1.00)
Creatinine, Ser: 0.62 mg/dL (ref 0.44–1.00)
GFR calc Af Amer: >60 mL/min (ref >60)
GFR calc Af Amer: >60 mL/min (ref >60)
GFR calc Af Amer: >60 mL/min (ref >60)
GFR calc non Af Amer: >60 mL/min (ref >60)
GFR calc non Af Amer: >60 mL/min (ref >60)
GFR calc non Af Amer: >60 mL/min (ref >60)
Glucose, Bld: 220 mg/dL — ABNORMAL HIGH (ref 70–99)
Glucose, Bld: 216 mg/dL — ABNORMAL HIGH (ref 70–99)
Glucose, Bld: 217 mg/dL — ABNORMAL HIGH (ref 70–99)
Potassium: 4.6 mmol/L (ref 3.5–5.1)
Potassium: 4.8 mmol/L (ref 3.5–5.1)
Potassium: 4.5 mmol/L (ref 3.5–5.1)
Sodium: 117 mmol/L — CL (ref 135–145)
Sodium: 117 mmol/L — CL (ref 135–145)
Sodium: 117 mmol/L — CL (ref 135–145)

## 2018-12-09 LAB — CBC
HCT: 40.9 % (ref 36.0–46.0)
Hemoglobin: 14.1 g/dL (ref 12.0–15.0)
MCH: 30.8 pg (ref 26.0–34.0)
MCHC: 34.5 g/dL (ref 30.0–36.0)
MCV: 89.3 fL (ref 80.0–100.0)
Platelets: 205 10*3/uL (ref 150–400)
RBC: 4.58 MIL/uL (ref 3.87–5.11)
RDW: 12.4 % (ref 11.5–15.5)
WBC: 8.4 10*3/uL (ref 4.0–10.5)
nRBC: 0 % (ref 0.0–0.2)

## 2018-12-09 LAB — CULTURE, BLOOD (ROUTINE X 2)
Culture: NO GROWTH
Culture: NO GROWTH

## 2018-12-09 LAB — GLUCOSE, CAPILLARY
Glucose-Capillary: 283 mg/dL — ABNORMAL HIGH (ref 70–99)
Glucose-Capillary: 233 mg/dL — ABNORMAL HIGH (ref 70–99)
Glucose-Capillary: 245 mg/dL — ABNORMAL HIGH (ref 70–99)
Glucose-Capillary: 251 mg/dL — ABNORMAL HIGH (ref 70–99)

## 2018-12-09 LAB — MAGNESIUM: Magnesium: 1.8 mg/dL (ref 1.7–2.4)

## 2018-12-09 MED ORDER — SODIUM CHLORIDE 0.9 % IV SOLN
INTRAVENOUS | Status: DC
  Administered 2018-12-09 (×2): via INTRAVENOUS
  Administered 2018-12-10: 100 mL via INTRAVENOUS
  Administered 2018-12-11 – 2018-12-13 (×6): via INTRAVENOUS

## 2018-12-09 MED ORDER — HYDROCOD POLST-CPM POLST ER 10-8 MG/5ML PO SUER
5.0000 mL | Freq: Two times a day (BID) | ORAL | Status: DC | PRN
  Administered 2018-12-09 – 2018-12-10 (×2): 5 mL via ORAL
  Filled 2018-12-09 (×3): qty 5

## 2018-12-09 MED ORDER — METHYLPREDNISOLONE SODIUM SUCC 125 MG IJ SOLR
60.0000 mg | Freq: Two times a day (BID) | INTRAMUSCULAR | Status: DC
  Administered 2018-12-09 – 2018-12-17 (×16): 60 mg via INTRAVENOUS
  Filled 2018-12-09 (×16): qty 2

## 2018-12-09 MED ORDER — HYDROCOD POLST-CPM POLST ER 10-8 MG/5ML PO SUER
5.0000 mL | Freq: Once | ORAL | Status: AC
  Administered 2018-12-09: 5 mL via ORAL
  Filled 2018-12-09: qty 5

## 2018-12-09 MED ORDER — SODIUM CHLORIDE 1 G PO TABS
1.0000 g | ORAL_TABLET | Freq: Three times a day (TID) | ORAL | Status: DC
  Administered 2018-12-09 – 2018-12-13 (×14): 1 g via ORAL
  Filled 2018-12-09 (×16): qty 1

## 2018-12-09 NOTE — Plan of Care (Signed)
  Problem: Activity: Goal: Risk for activity intolerance will decrease Outcome: Progressing   Problem: Nutrition: Goal: Adequate nutrition will be maintained Outcome: Progressing   Problem: Education: Goal: Knowledge of General Education information will improve Description: Including pain rating scale, medication(s)/side effects and non-pharmacologic comfort measures Outcome: Adequate for Discharge   Problem: Coping: Goal: Level of anxiety will decrease Outcome: Adequate for Discharge   Problem: Pain Managment: Goal: General experience of comfort will improve Outcome: Adequate for Discharge

## 2018-12-09 NOTE — Progress Notes (Signed)
Inpatient Diabetes Program Recommendations  AACE/ADA: New Consensus Statement on Inpatient Glycemic Control (2015)  Target Ranges:  Prepandial:   less than 140 mg/dL      Peak postprandial:   less than 180 mg/dL (1-2 hours)      Critically ill patients:  140 - 180 mg/dL   Lab Results  Component Value Date   GLUCAP 233 (H) 12/09/2018   HGBA1C 5.2 12/07/2018    Review of Glycemic Control Results for LAKENDRA, HELLING (MRN [id number]) as of 12/09/2018 15:18  Ref. Range 12/08/2018 16:59 12/08/2018 21:55 12/09/2018 07:55 12/09/2018 12:17  Glucose-Capillary Latest Ref Range: 70 - 99 mg/dL 261 (H) 155 (H) 283 (H) 233 (H)   Diabetes history: None Outpatient Diabetes medications: None Current orders for Inpatient glycemic control:  Novolog sensitive tid with meals and HS  Inpatient Diabetes Program Recommendations:    No history of DM.  A1C=5.2%.  While on steroids, consider adding Levemir 6 units bid.   Thanks,  Adah Perl, RN, BC-ADM Inpatient Diabetes Coordinator Pager [phone] (8a-5p)

## 2018-12-09 NOTE — Progress Notes (Signed)
PROGRESS NOTE  GLANDA SPANBAUER BMW:[id number] DOB: 15-Dec-1960 DOA: 12/03/2018 PCP: System, Pcp Not In  Brief History   58 year old female with history of COPD on 2 L, diastolic CHF, hypertension, hyperlipidemia, chronic hyponatremia, polycythemia, depression and schizoaffective disorder admitted with acute on chronic respiratory failure with desaturation to 60% on room air.  CXR showed bibasilar consolidation thought to be due to H CAP.  ABG with chronic respiratory acidosis.  Intubated on 6/10 and admitted to ICU.  Started on broad-spectrum antibiotic.  COVID-19 and blood cultures negative.  Urine showed grew E. coli.  Patient was extubated on 12/04/2018.  TRH assumed care on 12/06/2018. Sedating medications have been minimized due to the patient's decreased level of consciousness.   Sodium has been low chronically. It was 118 upon admission but did increase to 129 on 12/06/2018. This morning after diuresis was initiated on 12/07/2018, it has dropped back down to 117. NS has been started, and the patient's oral NaCl has been restarted.   Consultants  . None  Procedures  . None  Antibiotics   Anti-infectives (From admission, onward)   Start     Dose/Rate Route Frequency Ordered Stop   12/05/18 1000  cefTRIAXone (ROCEPHIN) 2 g in sodium chloride 0.9 % 100 mL IVPB     2 g 200 mL/hr over 30 Minutes Intravenous Every 24 hours 12/05/18 0814 12/07/18 1015   12/04/18 1800  vancomycin (VANCOCIN) 1,250 mg in sodium chloride 0.9 % 250 mL IVPB  Status:  Discontinued     1,250 mg 166.7 mL/hr over 90 Minutes Intravenous Every 24 hours 12/03/18 1744 12/05/18 1131   12/03/18 1715  vancomycin (VANCOCIN) 1,500 mg in sodium chloride 0.9 % 500 mL IVPB     1,500 mg 250 mL/hr over 120 Minutes Intravenous  Once 12/03/18 1709 12/03/18 2037   12/03/18 1715  ceFEPIme (MAXIPIME) 2 g in sodium chloride 0.9 % 100 mL IVPB  Status:  Discontinued     2 g 200 mL/hr over 30 Minutes Intravenous Every 8 hours 12/03/18  1709 12/05/18 0814    .  Marland Kitchen   Subjective  The patient is resting comfortably. No new complaints.  Objective   Vitals:  Vitals:   12/09/18 0759 12/09/18 1217  BP: (!) 185/56 (!) 198/54  Pulse: 64 63  Resp: 13 12  Temp: (!) 97.5 F (36.4 C)   SpO2: 99% 91%    Exam:  Constitutional:  . The patient's are resting comfortably. No new complaints. Respiratory:  . No increased work of breathing. . No wheezes, rales, or rhonchi. . No tactile fremitus. Cardiovascular:  . Regular rate and rhythm . No murmurs, ectopy, or gallups. . No lateral PMI. No thrills. Abdomen:  . Abdomen is soft, non-tender, non-distended. . No organomegaly, masses, or hernias are appreciated. . Normoactive bowel sounds. Musculoskeletal:  . No cyanosis, clubbing, or edema Skin:  . No rashes, lesions, ulcers . palpation of skin: no induration or nodules Neurologic:  . CN 2-12 intact . Sensation all 4 extremities intact Psychiatric:  . Mental status o Mood, affect appropriate o Orientation to person, place, time  . judgment and insight appear intact   I have personally reviewed the following:   Today's Data  . BMP, CBC, Vitals  Micro Data  . Respiratory culture: Positive for normal respiratory culture . Urine culture positive for E. Coli . Blood cultures have had no growth   Scheduled Meds: . acetaminophen  650 mg Oral Q8H  . arformoterol  15 mcg Nebulization  BID  . asenapine  5 mg Sublingual BID  . aspirin  81 mg Per Tube Daily  . budesonide (PULMICORT) nebulizer solution  0.5 mg Nebulization BID  . buPROPion  150 mg Oral Daily  . diazepam  2 mg Oral BID  . gabapentin  100 mg Oral QHS  . guaiFENesin  600 mg Oral BID  . heparin  5,000 Units Subcutaneous Q8H  . hydrALAZINE  100 mg Oral Q8H  . insulin aspart  0-5 Units Subcutaneous QHS  . insulin aspart  0-9 Units Subcutaneous TID WC  . ipratropium-albuterol  3 mL Nebulization TID  . lurasidone  40 mg Oral BID  . mouth rinse  15 mL  Mouth Rinse BID  . methylPREDNISolone (SOLU-MEDROL) injection  60 mg Intravenous Q12H  . metoprolol tartrate  12.5 mg Oral BID  . nicotine  21 mg Transdermal Daily  . NIFEdipine  90 mg Oral Daily  . pantoprazole  40 mg Oral QHS  . pravastatin  20 mg Per Tube q1800  . sodium chloride  1 g Oral TID WC  . traZODone  50 mg Oral QHS  . valproic acid  500 mg Oral BID   Continuous Infusions: . sodium chloride 250 mL (12/07/18 0943)  . sodium chloride 100 mL/hr at 12/09/18 0840    Active Problems:   Acute hypoxemic respiratory failure (HCC)   LOS: 6 days    A & P   Acute on chronic hypoxemic and hypercarbic respiratory failure due to COPD exacerbation and possible CHF exacerbation. Repeat ABG on 6/13 reassuring.  Blood and sputum cultures negative.  COVID-19 negative.  This is improving.  She is now saturating in mid 90s on 4 L by nasal cannula. Procalcitonin is negligible which makes bacterial pneumonia unlikely. COVID-19 and blood cultures negative. Extubated 12/04/2018. The patient received Vancomycin and cefepime initially in the ICU. That was de-escalated to Rocephin on 6/12. She is also receiving steroids which are being tapered down. Beta blockers were changed from Coreg to metoprolol for beta 1 selectivity. She has been continued on Brovana, budesonide and DuoNeb. Diazepam was decreased from 2 mg bid due to somnolence.  Acute on chronic hyponatremia: Sodium has been low chronically. It was 118 upon admission but did increase to 129 on 12/06/2018. This morning after diuresis was initiated on 12/07/2018, it has dropped back down to 117. NS has been started, and the patient's oral NaCl has been restarted. Will recheck sodium frequently.  Acute metabolic encephalopathy: Multifactorial including acute respiratory failure, UTI and medications. Thyroid panel consistent with euthyroid syndrome.  She is somnolent this morning. Reduce Valium to 2 mg twice daily on 6/13. Nightly gabapentin reduced to  100 mg. Trazadone reduced to 50 mg. Mucinex DM changed to guaifenesin only. Oxycodone has been reduced to 5 mg from q8 to q12 prn pain. Correct sodium appropriately. Frequent reorientation.  Acute on chronic diastolic CHF: Echo on 6/81 with EF greater than 65% and no significant structural abnormalities.  Patient was hypertensive on arrival.  CXR with bibasilar opacities.  BNP marginally elevated.  UOP -3.2L/24 hours.  Renal function is stable.  Hyponatremia worse.  Respiratory status improved. Lasix held due to low sodium. Monitor volume status, electrolytes, renal status,  and respiratory status.  E. coli UTI: She denies dysuria today.  No fever or leukocytosis. She has completed antibiotic course with ceftriaxone.  Depression/anxiety/schizoaffective disorder: Continue home Depakote, levels were low. Continue home carcinoma pain, Latuda and Paxil. Reduced Valium, gabapentin and trazodone.  Chronic pain/chronic  opiate use: Scheduled Tylenol 650 mg every 8 hours. Gabapentin and oxycodone reduced as above in the setting of somnolence.  Essential hypertension: Vented.  On Procardia, labetalol, and hydralazine at home. Metoprolol 12.5 mg twice daily-beta-1 selective. Resumed home Procardia  GERD/constipation: PPI. Scheduled MiraLAX x3 until she has bowel movement.  Inadequate oral intake related to acute illness: Appreciate nutrition input-Magic cup and supplements.  DVT prophylaxis: Heparin Code Status: Full Code Family Communication: None present Disposition Plan: Home   Cesareo Vickrey, DO [facility] Direct contact: see [email or web address]  7PM-7AM contact night coverage as above 12/09/2018, 3:05 PM  LOS: 6 days

## 2018-12-09 NOTE — Progress Notes (Signed)
PT Cancellation Note  Patient Details Name: Natalie Thomas MRN: [id number] DOB: [birth year]   Cancelled Treatment:    Reason Eval/Treat Not Completed: Patient declined. Pt reported she woke up at 3:00AM coughing and she was too tired.   Shary Decamp [facility] 12/09/2018, 11:39 AM  [place] Pager [phone] Office [phone]

## 2018-12-09 NOTE — Plan of Care (Signed)
  Problem: Clinical Measurements: Goal: Ability to maintain clinical measurements within normal limits will improve Outcome: Not Progressing   Problem: Coping: Goal: Level of anxiety will decrease Outcome: Not Progressing   Problem: Pain Managment: Goal: General experience of comfort will improve Outcome: Not Progressing

## 2018-12-10 LAB — GLUCOSE, CAPILLARY
Glucose-Capillary: 157 mg/dL — ABNORMAL HIGH (ref 70–99)
Glucose-Capillary: 133 mg/dL — ABNORMAL HIGH (ref 70–99)
Glucose-Capillary: 174 mg/dL — ABNORMAL HIGH (ref 70–99)
Glucose-Capillary: 148 mg/dL — ABNORMAL HIGH (ref 70–99)
Glucose-Capillary: 181 mg/dL — ABNORMAL HIGH (ref 70–99)

## 2018-12-10 LAB — BASIC METABOLIC PANEL
Anion gap: 10 (ref 5–15)
Anion gap: 8 (ref 5–15)
BUN: 19 mg/dL (ref 6–20)
BUN: 19 mg/dL (ref 6–20)
CO2: 31 mmol/L (ref 22–32)
CO2: 31 mmol/L (ref 22–32)
Calcium: 8.3 mg/dL — ABNORMAL LOW (ref 8.9–10.3)
Calcium: 8 mg/dL — ABNORMAL LOW (ref 8.9–10.3)
Chloride: 83 mmol/L — ABNORMAL LOW (ref 98–111)
Chloride: 84 mmol/L — ABNORMAL LOW (ref 98–111)
Creatinine, Ser: 0.76 mg/dL (ref 0.44–1.00)
Creatinine, Ser: 0.6 mg/dL (ref 0.44–1.00)
GFR calc Af Amer: >60 mL/min (ref >60)
GFR calc Af Amer: >60 mL/min (ref >60)
GFR calc non Af Amer: >60 mL/min (ref >60)
GFR calc non Af Amer: >60 mL/min (ref >60)
Glucose, Bld: 143 mg/dL — ABNORMAL HIGH (ref 70–99)
Glucose, Bld: 203 mg/dL — ABNORMAL HIGH (ref 70–99)
Potassium: 4.8 mmol/L (ref 3.5–5.1)
Potassium: 4.8 mmol/L (ref 3.5–5.1)
Sodium: 124 mmol/L — ABNORMAL LOW (ref 135–145)
Sodium: 123 mmol/L — ABNORMAL LOW (ref 135–145)

## 2018-12-10 LAB — CBC
HCT: 42.8 % (ref 36.0–46.0)
Hemoglobin: 14.4 g/dL (ref 12.0–15.0)
MCH: 30.3 pg (ref 26.0–34.0)
MCHC: 33.6 g/dL (ref 30.0–36.0)
MCV: 90.1 fL (ref 80.0–100.0)
Platelets: 201 10*3/uL (ref 150–400)
RBC: 4.75 MIL/uL (ref 3.87–5.11)
RDW: 12.4 % (ref 11.5–15.5)
WBC: 7 10*3/uL (ref 4.0–10.5)
nRBC: 0 % (ref 0.0–0.2)

## 2018-12-10 LAB — MAGNESIUM: Magnesium: 1.9 mg/dL (ref 1.7–2.4)

## 2018-12-10 MED ORDER — QUETIAPINE FUMARATE 25 MG PO TABS
25.0000 mg | ORAL_TABLET | Freq: Every day | ORAL | Status: DC
  Administered 2018-12-10 – 2018-12-18 (×9): 25 mg via ORAL
  Filled 2018-12-10 (×10): qty 1

## 2018-12-10 MED ORDER — HALOPERIDOL LACTATE 5 MG/ML IJ SOLN
2.0000 mg | Freq: Four times a day (QID) | INTRAMUSCULAR | Status: DC | PRN
  Administered 2018-12-10 – 2018-12-15 (×2): 2 mg via INTRAVENOUS
  Filled 2018-12-10 (×3): qty 1

## 2018-12-10 MED ORDER — QUETIAPINE FUMARATE 25 MG PO TABS
50.0000 mg | ORAL_TABLET | Freq: Every day | ORAL | Status: DC
  Administered 2018-12-10 – 2018-12-17 (×8): 50 mg via ORAL
  Filled 2018-12-10 (×8): qty 2

## 2018-12-10 MED ORDER — BENZONATATE 100 MG PO CAPS
100.0000 mg | ORAL_CAPSULE | Freq: Three times a day (TID) | ORAL | Status: DC | PRN
  Administered 2018-12-10 – 2018-12-11 (×2): 100 mg via ORAL
  Filled 2018-12-10 (×2): qty 1

## 2018-12-10 MED ORDER — ACETAMINOPHEN 325 MG PO TABS
650.0000 mg | ORAL_TABLET | ORAL | Status: DC | PRN
  Administered 2018-12-10 – 2018-12-14 (×7): 650 mg via ORAL
  Filled 2018-12-10 (×7): qty 2

## 2018-12-10 MED ORDER — IPRATROPIUM-ALBUTEROL 0.5-2.5 (3) MG/3ML IN SOLN
3.0000 mL | Freq: Two times a day (BID) | RESPIRATORY_TRACT | Status: DC
  Administered 2018-12-10 – 2018-12-15 (×10): 3 mL via RESPIRATORY_TRACT
  Filled 2018-12-10 (×10): qty 3

## 2018-12-10 MED ORDER — GUAIFENESIN-DM 100-10 MG/5ML PO SYRP
5.0000 mL | ORAL_SOLUTION | ORAL | Status: DC | PRN
  Administered 2018-12-10 – 2018-12-14 (×5): 5 mL via ORAL
  Filled 2018-12-10 (×5): qty 5

## 2018-12-10 NOTE — TOC Progression Note (Signed)
Transition of Care [facility]) - Progression Note    Patient Details  Name: GOWRI SUCHAN MRN: [id number] Date of Birth: 1961-02-27  Transition [facility]) CM/SW Contact  Amador Cunas, [place] Phone Number: 12/10/2018, 4:41 PM  Clinical Narrative:   Face to face manual review completed via phone in room with PASRR staff [facility] and patient. Per Pamala Hurry, screening is complete and PASRR should be issued later tonight or tomorrow morning. Per EMR review, pt not stable for dc today due to low sodium. Pt has auth for [facility].   Expected Discharge Plan: Skilled Nursing Facility Barriers to Discharge: Ship broker, Continued Medical Work up  Expected Discharge Plan and Services Expected Discharge Plan: [place] In-house Referral: Clinical Social Work   Post Acute Care Choice: [place] Living arrangements for the past 2 months: [place]                                       Social Determinants of Health (SDOH) Interventions    Readmission Risk Interventions Readmission Risk Prevention Plan 12/08/2018  Transportation Screening Complete  Medication Review Press [employer]) Complete  PCP or Specialist appointment within 3-5 days of discharge Complete  HRI or Home Care Consult Complete  SW Recovery Care/Counseling Consult Complete  Palliative Care Screening Not [place] Complete  Some recent data might be hidden

## 2018-12-10 NOTE — Progress Notes (Signed)
Ancillary staff (pt services) reports pt was verbally inappropriate in tones and mannerisms when in room and asked patient why she behaved towards her in such mannerisms. Pt also became verbally aggressive with staff several times including this nurse and could be heard yelling from hallway. MD made aware of pt behavior not improving.

## 2018-12-10 NOTE — Progress Notes (Signed)
During bedside report patient became verbally aggressive with increased voice tones (yelling) at reporting nurse. Pt was asked not to raise voice and yell at nursing staff. Pt was reminded that nursing staff was present to keep pt informed regarding care and in the process of also discussing medicine with pt. Pt begin to call reporting nurse untruthful, pt informed that we could discuss medication regimen and plan for day after reports from nurse. Pt remain with verbal agressions. Pt informed that if pt continued that nursing staff would have to step out of room. Pt continued to be verbally aggressive. Nursing staff left pt safe and left room. Pt called out using call bell. C.N.A. noted on hallway, this nurse requested c.n.a. assist pt. C.N.A. came out of room and reported that patient was becoming verbally aggressive with her in the room and c.n.a. had to leave the room, reports pt remain safe.

## 2018-12-10 NOTE — Progress Notes (Signed)
Pt requesting pain medication. This nurse suggested that pt stagger her tussinex and oxycodone every 6 hours. This nurse reminded pt that she had tussinex and 0200 and that if pt also rec'd oxycodone at this time she could not receive Tussinex or hydrocone until 1400. Pt argued stating "something is not right about that". This nurse remindedpt that both medication are ordered every 12 hours. Pt continues to argue.

## 2018-12-10 NOTE — Progress Notes (Signed)
Pt noted yelling at respiratory therapist who entered the room.

## 2018-12-10 NOTE — Progress Notes (Signed)
PROGRESS NOTE  Natalie Thomas MGQ:[id number] DOB: [birth year] DOA: 12/03/2018 PCP: System, Pcp Not In  Brief History   58 year old female with history of COPD on 2 L, diastolic CHF, hypertension, hyperlipidemia, chronic hyponatremia, polycythemia, depression and schizoaffective disorder admitted with acute on chronic respiratory failure with desaturation to 60% on room air.  CXR showed bibasilar consolidation thought to be due to H CAP.  ABG with chronic respiratory acidosis.  Intubated on 6/10 and admitted to ICU.  Started on broad-spectrum antibiotic.  COVID-19 and blood cultures negative.  Urine showed grew E. coli.  Patient was extubated on 12/04/2018.  TRH assumed care on 12/06/2018. Sedating medications have been minimized due to the patient's decreased level of consciousness.   Sodium has been low chronically. It was 118 upon admission but did increase to 129 on 12/06/2018. This morning after diuresis was initiated on 12/07/2018, it has dropped back down to 117. NS has been started, and the patient's oral NaCl has been restarted. Today it is up to 124.  Consultants  . None  Procedures  . None  Antibiotics   Anti-infectives (From admission, onward)   Start     Dose/Rate Route Frequency Ordered Stop   12/05/18 1000  cefTRIAXone (ROCEPHIN) 2 g in sodium chloride 0.9 % 100 mL IVPB     2 g 200 mL/hr over 30 Minutes Intravenous Every 24 hours 12/05/18 0814 12/07/18 1015   12/04/18 1800  vancomycin (VANCOCIN) 1,250 mg in sodium chloride 0.9 % 250 mL IVPB  Status:  Discontinued     1,250 mg 166.7 mL/hr over 90 Minutes Intravenous Every 24 hours 12/03/18 1744 12/05/18 1131   12/03/18 1715  vancomycin (VANCOCIN) 1,500 mg in sodium chloride 0.9 % 500 mL IVPB     1,500 mg 250 mL/hr over 120 Minutes Intravenous  Once 12/03/18 1709 12/03/18 2037   12/03/18 1715  ceFEPIme (MAXIPIME) 2 g in sodium chloride 0.9 % 100 mL IVPB  Status:  Discontinued     2 g 200 mL/hr over 30 Minutes Intravenous  Every 8 hours 12/03/18 1709 12/05/18 0814     .   Subjective  The patient has been very aggressive and inappropriate with staff this morning. Seroquel was attempted to improve her behavior without improvement. IV haldol has now been ordered.  Objective   Vitals:  Vitals:   12/10/18 1016 12/10/18 1203  BP: (!) 160/46 (!) 156/75  Pulse: 67 63  Resp:  13  Temp:  97.6 F (36.4 C)  SpO2:  98%    Exam:  Constitutional:  . The patient is awake, alert, and oriented x 3. She has been very agitated and very agitated. Respiratory:  . No increased work of breathing. . No wheezes, rales, or rhonchi. . No tactile fremitus. Cardiovascular:  . Regular rate and rhythm . No murmurs, ectopy, or gallups. . No lateral PMI. No thrills. Abdomen:  . Abdomen is soft, non-tender, non-distended. . No organomegaly, masses, or hernias are appreciated. . Normoactive bowel sounds. Musculoskeletal:  . No cyanosis, clubbing, or edema Skin:  . No rashes, lesions, ulcers . palpation of skin: no induration or nodules Neurologic:  . CN 2-12 intact . Sensation all 4 extremities intact Psychiatric:  . Mental status o Mood, affect: Anxious/aggressive. o Orientation to person, place, time  . Judgment and insight do not appear intact   I have personally reviewed the following:   Today's Data  . BMP, CBC, Vitals  Micro Data  . Respiratory culture: Positive for normal respiratory  culture . Urine culture positive for E. Coli . Blood cultures have had no growth   Scheduled Meds: . arformoterol  15 mcg Nebulization BID  . asenapine  5 mg Sublingual BID  . aspirin  81 mg Per Tube Daily  . budesonide (PULMICORT) nebulizer solution  0.5 mg Nebulization BID  . buPROPion  150 mg Oral Daily  . diazepam  2 mg Oral BID  . gabapentin  100 mg Oral QHS  . guaiFENesin  600 mg Oral BID  . heparin  5,000 Units Subcutaneous Q8H  . hydrALAZINE  100 mg Oral Q8H  . insulin aspart  0-5 Units Subcutaneous QHS   . insulin aspart  0-9 Units Subcutaneous TID WC  . ipratropium-albuterol  3 mL Nebulization BID  . lurasidone  40 mg Oral BID  . mouth rinse  15 mL Mouth Rinse BID  . methylPREDNISolone (SOLU-MEDROL) injection  60 mg Intravenous Q12H  . metoprolol tartrate  12.5 mg Oral BID  . nicotine  21 mg Transdermal Daily  . NIFEdipine  90 mg Oral Daily  . pantoprazole  40 mg Oral QHS  . pravastatin  20 mg Per Tube q1800  . QUEtiapine  25 mg Oral Daily  . QUEtiapine  50 mg Oral QHS  . sodium chloride  1 g Oral TID WC  . traZODone  50 mg Oral QHS  . valproic acid  500 mg Oral BID   Continuous Infusions: . sodium chloride 250 mL (12/07/18 0943)  . sodium chloride Stopped (12/10/18 1225)    Active Problems:   Acute hypoxemic respiratory failure (HCC)   LOS: 7 days    A & P   Acute on chronic hypoxemic and hypercarbic respiratory failure due to COPD exacerbation and possible CHF exacerbation. Repeat ABG on 6/13 reassuring.  Blood and sputum cultures negative.  COVID-19 negative.  This is improving.  She is now saturating in mid 90s on 4 L by nasal cannula. Procalcitonin is negligible which makes bacterial pneumonia unlikely. COVID-19 and blood cultures negative. Extubated 12/04/2018. The patient received Vancomycin and cefepime initially in the ICU. That was de-escalated to Rocephin on 6/12. She is also receiving steroids which are being tapered down. Beta blockers were changed from Coreg to metoprolol for beta 1 selectivity. She has been continued on Brovana, budesonide and DuoNeb. Diazepam was decreased from 2 mg bid due to somnolence.  Acute on chronic hyponatremia: Sodium has been low chronically. It was 118 upon admission but did increase to 129 on 12/06/2018. This morning after diuresis was initiated on 12/07/2018, it has dropped back down to 117. NS has been started, and the patient's oral NaCl has been restarted. Will recheck sodium frequently.  Acute metabolic encephalopathy: Multifactorial  including acute respiratory failure, UTI and medications. Thyroid panel consistent with euthyroid syndrome.  She is somnolent this morning. Reduce Valium to 2 mg twice daily on 6/13. Nightly gabapentin reduced to 100 mg. Trazadone reduced to 50 mg. Mucinex DM changed to guaifenesin only. Oxycodone has been reduced to 5 mg from q8 to q12 prn pain. Correct sodium appropriately. Frequent reorientation.  Aggression and agitation: Seroquel added, but did not improve her behavior. IV Haldol has been added. The patient is receiving Depakote as well which should help with moderation of her behavior.  Acute on chronic diastolic CHF: Echo on 4/91 with EF greater than 65% and no significant structural abnormalities.  Patient was hypertensive on arrival.  CXR with bibasilar opacities.  BNP marginally elevated.  UOP -3.2L/24 hours.  Renal function is stable.  Hyponatremia worse.  Respiratory status improved. Lasix held due to low sodium. Monitor volume status, electrolytes, renal status,  and respiratory status.  E. coli UTI: She denies dysuria today.  No fever or leukocytosis. She has completed antibiotic course with ceftriaxone.  Depression/anxiety/schizoaffective disorder: Continue home Depakote, levels were low. Continue home carcinoma pain, Latuda and Paxil. Reduced Valium, gabapentin and trazodone.  Chronic pain/chronic opiate use: Scheduled Tylenol 650 mg every 8 hours. Gabapentin and oxycodone reduced as above in the setting of somnolence.  Essential hypertension: Vented.  On Procardia, labetalol, and hydralazine at home. Metoprolol 12.5 mg twice daily-beta-1 selective. Resumed home Procardia  GERD/constipation: PPI. Scheduled MiraLAX x3 until she has bowel movement.  Inadequate oral intake related to acute illness: Appreciate nutrition input-Magic cup and supplements.  I have seen and examined this patient myself. I have spent 35 minutes in her evaluation and care.  DVT prophylaxis: Heparin  Code Status: Full Code Family Communication: None present Disposition Plan: Home   Odaly Peri, DO [facility] Direct contact: see [email or web address]  7PM-7AM contact night coverage as above 12/10/2018, 1:09 PM  LOS: 6 days

## 2018-12-10 NOTE — TOC Progression Note (Signed)
Transition of Care [facility]) - Progression Note    Patient Details  Name: TAKIMA ENCINA MRN: [id number] Date of Birth: January 14, 1961  Transition of Care [facility]) CM/SW Contact  Wandra Feinstein [place], [place] Phone Number: 12/10/2018, 8:44 AM  Clinical Narrative:  Spoke to [place] [phone] who has accepted offer from [facility]. PASRR remains pending manual review; 30 day note placed on paper chart and paged MD for signature. Notified Juliann Pulse with [place] who will initiate [place] request.      Expected Discharge Plan: Skilled Nursing Facility Barriers to Discharge: Ship broker, Continued Medical Work up  Expected Discharge Plan and Services Expected Discharge Plan: [place] In-house Referral: Clinical Social Work   Post Acute Care Choice: [place] Living arrangements for the past 2 months: [place]                                       Social Determinants of Health (SDOH) Interventions    Readmission Risk Interventions Readmission Risk Prevention Plan 12/08/2018  Transportation Screening Complete  Medication Review Press photographer) Complete  PCP or Specialist appointment within 3-5 days of discharge Complete  HRI or Home Care Consult Complete  SW Recovery Care/Counseling Consult Complete  Palliative Care Screening Not [place] Complete  Some recent data might be hidden

## 2018-12-10 NOTE — Progress Notes (Signed)
Communication sent to MD r/t pt behaviors.

## 2018-12-10 NOTE — Care Management Important Message (Signed)
Important Message  Patient Details  Name: Natalie Thomas MRN: [id number] Date of Birth: 1960-10-22   Medicare Important Message Given:  Yes    Orbie Pyo 12/10/2018, 3:38 PM

## 2018-12-10 NOTE — Progress Notes (Signed)
Physical Therapy Treatment Patient Details Name: Natalie Thomas MRN: [id number] DOB: [birth year] Today's Date: 12/10/2018    History of Present Illness Pt adm from SNF with hypoxic respiratory failure due to copd exacerbation and PNA. PMH - rt hip fx, copd, schizoaffective disorder, htn, dm, chf, lumbar disc disease, pelvic fx    PT Comments    Pt received in bed, only agreeable to transfer to chair due to not eating breakfast yet. She required min assist bed mobility, min assist sit to stand and min assist SPT with RW. +2 utilized for safety/lines. Pt will need +2 to progress ambulation for chair follow and line management as pt is on 4 L O2. Pt positioned in recliner with feet elevated at end of session.    Follow Up Recommendations  SNF     Equipment Recommendations  None recommended by PT    Recommendations for Other Services       Precautions / Restrictions Precautions Precautions: Fall    Mobility  Bed Mobility Overal bed mobility: Needs Assistance Bed Mobility: Supine to Sit     Supine to sit: Min assist;HOB elevated     General bed mobility comments: +rail, assist to elevate trunk and scoot to EOB  Transfers Overall transfer level: Needs assistance Equipment used: Rolling walker (2 wheeled) Transfers: Sit to/from [facility] Sit to Stand: Min assist Stand pivot transfers: Min assist       General transfer comment: cues for hand placement. Assist to power up, pivot transfer with RW  Ambulation/Gait                 Stairs             Wheelchair Mobility    Modified Rankin (Stroke Patients Only)       Balance Overall balance assessment: Needs assistance Sitting-balance support: No upper extremity supported;Feet supported Sitting balance-Leahy Scale: Fair     Standing balance support: Bilateral upper extremity supported;During functional activity Standing balance-Leahy Scale: Poor Standing balance comment: reliant  on UE support                            Cognition Arousal/Alertness: Awake/alert Behavior During Therapy: WFL for tasks assessed/performed;Agitated(mildly agitated) Overall Cognitive Status: No family/caregiver present to determine baseline cognitive functioning                                        Exercises      General Comments General comments (skin integrity, edema, etc.): Pt on 4 L O2 during session with SpO2 >90%.      Pertinent Vitals/Pain Pain Assessment: Faces Faces Pain Scale: No hurt    Home Living                      Prior Function            PT Goals (current goals can now be found in the care plan section) Acute Rehab PT Goals Patient Stated Goal: not stated PT Goal Formulation: With patient Time For Goal Achievement: 12/19/18 Potential to Achieve Goals: Fair Progress towards PT goals: Progressing toward goals    Frequency    Min 2X/week      PT Plan Current plan remains appropriate    Co-evaluation              AM-PAC PT "6  Clicks" Mobility   Outcome Measure  Help needed turning from your back to your side while in a flat bed without using bedrails?: A Little Help needed moving from lying on your back to sitting on the side of a flat bed without using bedrails?: A Little Help needed moving to and from a bed to a chair (including a wheelchair)?: A Little Help needed standing up from a chair using your arms (e.g., wheelchair or bedside chair)?: A Little Help needed to walk in hospital room?: A Little Help needed climbing 3-5 steps with a railing? : Total 6 Click Score: 16    End of Session Equipment Utilized During Treatment: Oxygen;Gait belt Activity Tolerance: Patient tolerated treatment well Patient left: in chair;with call bell/phone within reach;with chair alarm set Nurse Communication: Mobility status PT Visit Diagnosis: Unsteadiness on feet (R26.81);Muscle weakness (generalized)  (M62.81);Other abnormalities of gait and mobility (R26.89)     Time: 2423-5361 PT Time Calculation (min) (ACUTE ONLY): 19 min  Charges:  $Therapeutic Activity: 8-22 mins                     Lorrin Goodell, PT  Office # [phone] Pager [phone]    Lorriane Shire 12/10/2018, 12:14 PM

## 2018-12-11 LAB — GLUCOSE, CAPILLARY
Glucose-Capillary: 131 mg/dL — ABNORMAL HIGH (ref 70–99)
Glucose-Capillary: 162 mg/dL — ABNORMAL HIGH (ref 70–99)
Glucose-Capillary: 200 mg/dL — ABNORMAL HIGH (ref 70–99)
Glucose-Capillary: 153 mg/dL — ABNORMAL HIGH (ref 70–99)

## 2018-12-11 LAB — BASIC METABOLIC PANEL
Anion gap: 7 (ref 5–15)
BUN: 18 mg/dL (ref 6–20)
CO2: 34 mmol/L — ABNORMAL HIGH (ref 22–32)
Calcium: 8.6 mg/dL — ABNORMAL LOW (ref 8.9–10.3)
Chloride: 90 mmol/L — ABNORMAL LOW (ref 98–111)
Creatinine, Ser: 0.52 mg/dL (ref 0.44–1.00)
GFR calc Af Amer: >60 mL/min (ref >60)
GFR calc non Af Amer: >60 mL/min (ref >60)
Glucose, Bld: 120 mg/dL — ABNORMAL HIGH (ref 70–99)
Potassium: 4.6 mmol/L (ref 3.5–5.1)
Sodium: 131 mmol/L — ABNORMAL LOW (ref 135–145)

## 2018-12-11 LAB — CBC
HCT: 44.4 % (ref 36.0–46.0)
Hemoglobin: 14.8 g/dL (ref 12.0–15.0)
MCH: 30.7 pg (ref 26.0–34.0)
MCHC: 33.3 g/dL (ref 30.0–36.0)
MCV: 92.1 fL (ref 80.0–100.0)
Platelets: 205 10*3/uL (ref 150–400)
RBC: 4.82 MIL/uL (ref 3.87–5.11)
RDW: 12.6 % (ref 11.5–15.5)
WBC: 7.3 10*3/uL (ref 4.0–10.5)
nRBC: 0 % (ref 0.0–0.2)

## 2018-12-11 LAB — MAGNESIUM: Magnesium: 2 mg/dL (ref 1.7–2.4)

## 2018-12-11 MED ORDER — ARFORMOTEROL TARTRATE 15 MCG/2ML IN NEBU: 15.0000 ug | INHALATION_SOLUTION | Freq: Two times a day (BID) | RESPIRATORY_TRACT | 0 refills | Status: DC

## 2018-12-11 MED ORDER — SAPHRIS 5 MG SL SUBL: 5.0000 mg | SUBLINGUAL_TABLET | Freq: Two times a day (BID) | SUBLINGUAL | 0 refills | Status: DC

## 2018-12-11 MED ORDER — QUETIAPINE FUMARATE 25 MG PO TABS: 25.0000 mg | ORAL_TABLET | Freq: Every day | ORAL | 0 refills | Status: DC

## 2018-12-11 MED ORDER — BUDESONIDE 0.5 MG/2ML IN SUSP: 0.5000 mg | Freq: Two times a day (BID) | RESPIRATORY_TRACT | 0 refills | Status: DC

## 2018-12-11 MED ORDER — CALCIUM CARBONATE ANTACID 500 MG PO CHEW: 1.0000 | CHEWABLE_TABLET | Freq: Two times a day (BID) | ORAL | 0 refills | Status: DC | PRN

## 2018-12-11 MED ORDER — VALPROIC ACID 250 MG PO CAPS: 500.0000 mg | ORAL_CAPSULE | Freq: Two times a day (BID) | ORAL | 0 refills | Status: DC

## 2018-12-11 MED ORDER — POLYETHYLENE GLYCOL 3350 17 G PO PACK: 17.0000 g | PACK | Freq: Every day | ORAL | 0 refills | Status: AC

## 2018-12-11 MED ORDER — OXYCODONE HCL 5 MG PO TABS: 5.0000 mg | ORAL_TABLET | Freq: Every day | ORAL | 0 refills | Status: DC

## 2018-12-11 MED ORDER — QUETIAPINE FUMARATE 50 MG PO TABS: 50.0000 mg | ORAL_TABLET | Freq: Every day | ORAL | 0 refills | Status: DC

## 2018-12-11 MED ORDER — GABAPENTIN 100 MG PO CAPS: 100.0000 mg | ORAL_CAPSULE | Freq: Every day | ORAL | 0 refills | Status: DC

## 2018-12-11 MED ORDER — BENZONATATE 100 MG PO CAPS: 100.0000 mg | ORAL_CAPSULE | Freq: Three times a day (TID) | ORAL | 0 refills | Status: DC | PRN

## 2018-12-11 MED ORDER — DIAZEPAM 2 MG PO TABS: 2.0000 mg | ORAL_TABLET | Freq: Two times a day (BID) | ORAL | 0 refills | Status: DC

## 2018-12-11 MED ORDER — ASPIRIN 81 MG PO CHEW
81.0000 mg | CHEWABLE_TABLET | Freq: Every day | ORAL | Status: DC
  Administered 2018-12-12 – 2018-12-18 (×7): 81 mg via ORAL
  Filled 2018-12-11 (×7): qty 1

## 2018-12-11 MED ORDER — ALBUTEROL SULFATE (2.5 MG/3ML) 0.083% IN NEBU: 2.5000 mg | INHALATION_SOLUTION | RESPIRATORY_TRACT | 12 refills | Status: AC | PRN

## 2018-12-11 MED ORDER — ENALAPRILAT 1.25 MG/ML IV SOLN
1.2500 mg | Freq: Once | INTRAVENOUS | Status: AC
  Administered 2018-12-11: 1.25 mg via INTRAVENOUS
  Filled 2018-12-11: qty 1

## 2018-12-11 MED ORDER — TRAZODONE HCL 50 MG PO TABS: 50.0000 mg | ORAL_TABLET | Freq: Every day | ORAL | 0 refills | Status: DC

## 2018-12-11 MED ORDER — NICOTINE 21 MG/24HR TD PT24: 21.0000 mg | MEDICATED_PATCH | Freq: Every day | TRANSDERMAL | 0 refills | Status: DC

## 2018-12-11 MED ORDER — PREDNISONE 20 MG PO TABS: ORAL_TABLET | ORAL | 0 refills | Status: AC

## 2018-12-11 MED ORDER — SODIUM CHLORIDE 1 G PO TABS: 1.0000 g | ORAL_TABLET | Freq: Three times a day (TID) | ORAL | 0 refills | Status: DC

## 2018-12-11 MED ORDER — METOPROLOL TARTRATE 25 MG PO TABS: 12.5000 mg | ORAL_TABLET | Freq: Two times a day (BID) | ORAL | 0 refills | Status: DC

## 2018-12-11 NOTE — Progress Notes (Signed)
Occupational Therapy Treatment Patient Details Name: Natalie Thomas MRN: [id number] DOB: 09/29/1960 Today's Date: 12/11/2018    History of present illness Pt adm from SNF with hypoxic respiratory failure due to copd exacerbation and PNA. PMH - rt hip fx, copd, schizoaffective disorder, htn, dm, chf, lumbar disc disease, pelvic fx   OT comments  Pt progressing toward established goals. Upon OT arrival, pt returned to recliner after ambulating from [facility] with use of RW and minA from NT. Pt currently requires minguard-minA for ADL and functional mobility. Pt completed oral care while standing with minguard assistance, requiring 2 seated rest breaks 10sec each. Pt on 4lnc maintained SpO2 >92% throughout session. Pt will continue to benefit from skilled OT services to maximize safety and independence with ADL/IADL and functional mobility. Will continue to follow acutely and progress as tolerated.     Follow Up Recommendations  SNF    Equipment Recommendations  3 in 1 bedside commode    Recommendations for Other Services      Precautions / Restrictions Precautions Precautions: Fall Restrictions Weight Bearing Restrictions: No       Mobility Bed Mobility               General bed mobility comments: pt sitting in recliner upon arrival  Transfers Overall transfer level: Needs assistance Equipment used: Rolling walker (2 wheeled) Transfers: Sit to/from Stand Sit to Stand: Min guard         General transfer comment: minguard for safety and stability     Balance Overall balance assessment: Needs assistance Sitting-balance support: No upper extremity supported;Feet supported Sitting balance-Leahy Scale: Fair     Standing balance support: Single extremity supported;During functional activity Standing balance-Leahy Scale: Poor Standing balance comment: reliant on at least one UE support while standing to complete oral care;intermittent unsupported standing for very brief  amount of time                           ADL either performed or assessed with clinical judgement   ADL Overall ADL's : Needs assistance/impaired Eating/Feeding: Set up;Sitting   Grooming: Min guard;Standing Grooming Details (indicate cue type and reason): pt completed oral care while standing with minguard for stability Upper Body Bathing: Set up;Sitting           Lower Body Dressing: Sit to/from stand;Min guard Lower Body Dressing Details (indicate cue type and reason): adjusted socks sitting in recliner Toilet Transfer: Minimal assistance;RW Toilet Transfer Details (indicate cue type and reason): upon arrival, pt returned to recliner with NT after using BSC          Functional mobility during ADLs: Minimal assistance;Rolling walker General ADL Comments: SpO2 92% and above 4lnc throughout session     Vision       Perception     Praxis      Cognition Arousal/Alertness: Awake/alert Behavior During Therapy: WFL for tasks assessed/performed;Agitated Overall Cognitive Status: No family/caregiver present to determine baseline cognitive functioning                                 General Comments: mildly agitated having to complete oral care in standing        Exercises     Shoulder Instructions       General Comments Pt on 4lnc during session with SpO2 >92%    Pertinent Vitals/ Pain       Pain  Assessment: No/denies pain Pain Intervention(s): Limited activity within patient's tolerance;Monitored during session  Home Living                                          Prior Functioning/Environment              Frequency  Min 2X/week        Progress Toward Goals  OT Goals(current goals can now be found in the care plan section)  Progress towards OT goals: Progressing toward goals  Acute Rehab OT Goals Patient Stated Goal: not stated OT Goal Formulation: With patient Time For Goal Achievement:  12/22/18 Potential to Achieve Goals: Good ADL Goals Pt Will Perform Grooming: with modified independence;standing;sitting Pt Will Perform Upper Body Dressing: with modified independence Pt Will Perform Lower Body Dressing: with modified independence;sit to/from stand Pt Will Transfer to Toilet: with modified independence;ambulating Pt Will Perform Tub/Shower Transfer: with modified independence;ambulating Additional ADL Goal #1: Pt will demonstrate use of 3 energy conservation strategies during ADL.  Plan Discharge plan remains appropriate    Co-evaluation                 AM-PAC OT "6 Clicks" Daily Activity     Outcome Measure   Help from another person eating meals?: None Help from another person taking care of personal grooming?: A Little Help from another person toileting, which includes using toliet, bedpan, or urinal?: A Little Help from another person bathing (including washing, rinsing, drying)?: A Little Help from another person to put on and taking off regular upper body clothing?: A Little Help from another person to put on and taking off regular lower body clothing?: A Little 6 Click Score: 19    End of Session Equipment Utilized During Treatment: Gait belt;Rolling walker;Oxygen  OT Visit Diagnosis: Unsteadiness on feet (R26.81);Other abnormalities of gait and mobility (R26.89);Muscle weakness (generalized) (M62.81);Pain   Activity Tolerance Patient tolerated treatment well   Patient Left in chair;with call bell/phone within reach   Nurse Communication Mobility status        Time: 1308-6578 OT Time Calculation (min): 24 min  Charges: OT General Charges $OT Visit: 1 Visit OT Treatments $Self Care/Home Management : 23-37 mins  [place] Office: [place] 12/11/2018, 11:58 AM

## 2018-12-11 NOTE — Progress Notes (Signed)
Physical Therapy Treatment Patient Details Name: Natalie Thomas MRN: [id number] DOB: [birth year] Today's Date: 12/11/2018    History of Present Illness Pt adm from SNF with hypoxic respiratory failure due to copd exacerbation and PNA. PMH - rt hip fx, copd, schizoaffective disorder, htn, dm, chf, lumbar disc disease, pelvic fx    PT Comments    Pt making steady progress with mobility. Continue to recommend SNF.    Follow Up Recommendations  SNF     Equipment Recommendations  None recommended by PT    Recommendations for Other Services       Precautions / Restrictions Precautions Precautions: Fall Restrictions Weight Bearing Restrictions: No    Mobility  Bed Mobility Overal bed mobility: Needs Assistance Bed Mobility: Supine to Sit;Sit to Supine     Supine to sit: Min assist;HOB elevated Sit to supine: Min assist   General bed mobility comments: Assist to elevate trunk into sitting. Assist to bring legs back up into bed and guiding trunk on return to supine.  Transfers Overall transfer level: Needs assistance Equipment used: Rolling walker (2 wheeled) Transfers: Sit to/from Stand Sit to Stand: Min guard         General transfer comment: Assist for safety and lines  Ambulation/Gait Ambulation/Gait assistance: Min assist;+2 safety/equipment Gait Distance (Feet): 25 Feet(25' x 1, 15' x 1) Assistive device: Rolling walker (2 wheeled) Gait Pattern/deviations: Step-through pattern;Decreased step length - right;Decreased step length - left;Trunk flexed Gait velocity: decr Gait velocity interpretation: <1.31 ft/sec, indicative of household ambulator General Gait Details: Assist for balance and support. Pt amb on 4L of O2 with SpO2 94-96%   Stairs             Wheelchair Mobility    Modified Rankin (Stroke Patients Only)       Balance Overall balance assessment: Needs assistance Sitting-balance support: No upper extremity supported;Feet  supported Sitting balance-Leahy Scale: Fair     Standing balance support: Single extremity supported;During functional activity Standing balance-Leahy Scale: Poor Standing balance comment: walker and min guard for static standing                            Cognition Arousal/Alertness: Awake/alert Behavior During Therapy: WFL for tasks assessed/performed;Agitated Overall Cognitive Status: No family/caregiver present to determine baseline cognitive functioning                                 General Comments: Pleasant and following all commands.      Exercises      General Comments General comments (skin integrity, edema, etc.): Pt on 4lnc during session with SpO2 >92%      Pertinent Vitals/Pain Pain Assessment: No/denies pain Pain Intervention(s): Limited activity within patient's tolerance;Monitored during session    Home Living                      Prior Function            PT Goals (current goals can now be found in the care plan section) Acute Rehab PT Goals Patient Stated Goal: not stated Progress towards PT goals: Progressing toward goals    Frequency    Min 2X/week      PT Plan Current plan remains appropriate    Co-evaluation              AM-PAC PT "6 Clicks" Mobility   Outcome  Measure  Help needed turning from your back to your side while in a flat bed without using bedrails?: A Little Help needed moving from lying on your back to sitting on the side of a flat bed without using bedrails?: A Little Help needed moving to and from a bed to a chair (including a wheelchair)?: A Little Help needed standing up from a chair using your arms (e.g., wheelchair or bedside chair)?: A Little Help needed to walk in hospital room?: A Little Help needed climbing 3-5 steps with a railing? : Total 6 Click Score: 16    End of Session Equipment Utilized During Treatment: Oxygen;Gait belt Activity Tolerance: Patient tolerated  treatment well Patient left: in bed;with call bell/phone within reach;with bed alarm set Nurse Communication: Mobility status PT Visit Diagnosis: Unsteadiness on feet (R26.81);Muscle weakness (generalized) (M62.81);Other abnormalities of gait and mobility (R26.89)     Time: 1355-1415 PT Time Calculation (min) (ACUTE ONLY): 20 min  Charges:  $Gait Training: 8-22 mins                     [place] Pager [phone] Office [place] 12/11/2018, 2:50 PM

## 2018-12-11 NOTE — Progress Notes (Signed)
CSW acknowledging discharge to SNF. Patient will need new COVID test for SNF admission, CSW alerted MD. After COVID results, patient can DC to [facility].  Laveda Abbe, [place] Clinical Social Worker [phone]

## 2018-12-11 NOTE — Progress Notes (Signed)
PROGRESS NOTE  Natalie Thomas UTM:[id number] DOB: [birth year] DOA: 12/03/2018 PCP: System, Pcp Not In  Brief History   58 year old female with history of COPD on 2 L, diastolic CHF, hypertension, hyperlipidemia, chronic hyponatremia, polycythemia, depression and schizoaffective disorder admitted with acute on chronic respiratory failure with desaturation to 60% on room air.  CXR showed bibasilar consolidation thought to be due to H CAP.  ABG with chronic respiratory acidosis.  Intubated on 6/10 and admitted to ICU.  Started on broad-spectrum antibiotic.  COVID-19 and blood cultures negative.  Urine showed grew E. coli.  Patient was extubated on 12/04/2018.  TR[facility] assumed care on 12/06/2018. Sedating medications have been minimized due to the patient's decreased level of consciousness.   Sodium has been low chronically. It was 118 upon admission but did increase to 129 on 12/06/2018. This morning after diuresis was initiated on 12/07/2018, it has dropped back down to 117. NS has been started, and the patient's oral NaCl has been restarted. Today it is up to 131.  Consultants  . None  Procedures  . None  Antibiotics   Anti-infectives (From admission, onward)   Start     Dose/Rate Route Frequency Ordered Stop   12/05/18 1000  cefTRIAXone (ROCEPHIN) 2 g in sodium chloride 0.9 % 100 mL IVPB     2 g 200 mL/hr over 30 Minutes Intravenous Every 24 hours 12/05/18 0814 12/07/18 1015   12/04/18 1800  vancomycin (VANCOCIN) 1,250 mg in sodium chloride 0.9 % 250 mL IVPB  Status:  Discontinued     1,250 mg 166.7 mL/hr over 90 Minutes Intravenous Every 24 hours 12/03/18 1744 12/05/18 1131   12/03/18 1715  vancomycin (VANCOCIN) 1,500 mg in sodium chloride 0.9 % 500 mL IVPB     1,500 mg 250 mL/hr over 120 Minutes Intravenous  Once 12/03/18 1709 12/03/18 2037   12/03/18 1715  ceFEPIme (MAXIPIME) 2 g in sodium chloride 0.9 % 100 mL IVPB  Status:  Discontinued     2 g 200 mL/hr over 30 Minutes Intravenous  Every 8 hours 12/03/18 1709 12/05/18 0814     .   Subjective  The patient is resting quietly. No new complaints.   Objective   Vitals:  Vitals:   12/11/18 0736 12/11/18 1201  BP:  (!) 187/51  Pulse:  65  Resp:  18  Temp:  97.8 F (36.6 C)  SpO2: 96% 97%    Exam:  Constitutional:  . The patient is awake, alert, and oriented x 3. No acute distressed. Respiratory:  . No increased work of breathing. . No wheezes, rales, or rhonchi. . No tactile fremitus. Cardiovascular:  . Regular rate and rhythm . No murmurs, ectopy, or gallups. . No lateral PMI. No thrills. Abdomen:  . Abdomen is soft, non-tender, non-distended. . No organomegaly, masses, or hernias are appreciated. . Normoactive bowel sounds. Musculoskeletal:  . No cyanosis, clubbing, or edema Skin:  . No rashes, lesions, ulcers . palpation of skin: no induration or nodules Neurologic:  . CN 2-12 intact . Sensation all 4 extremities intact Psychiatric:  . Mental status o Mood, affect: Anxious/aggressive. o Orientation to person, place, time  . Judgment and insight do not appear intact   I have personally reviewed the following:   Today's Data  . BMP, CBC, Vitals  Micro Data  . Respiratory culture: Positive for normal respiratory culture . Urine culture positive for E. Coli . Blood cultures have had no growth   Scheduled Meds: . arformoterol  15 mcg  Nebulization BID  . asenapine  5 mg Sublingual BID  . [START ON 12/12/2018] aspirin  81 mg Oral Daily  . budesonide (PULMICORT) nebulizer solution  0.5 mg Nebulization BID  . buPROPion  150 mg Oral Daily  . diazepam  2 mg Oral BID  . gabapentin  100 mg Oral QHS  . guaiFENesin  600 mg Oral BID  . heparin  5,000 Units Subcutaneous Q8H  . hydrALAZINE  100 mg Oral Q8H  . insulin aspart  0-5 Units Subcutaneous QHS  . insulin aspart  0-9 Units Subcutaneous TID WC  . ipratropium-albuterol  3 mL Nebulization BID  . lurasidone  40 mg Oral BID  . mouth rinse   15 mL Mouth Rinse BID  . methylPREDNISolone (SOLU-MEDROL) injection  60 mg Intravenous Q12H  . metoprolol tartrate  12.5 mg Oral BID  . nicotine  21 mg Transdermal Daily  . NIFEdipine  90 mg Oral Daily  . pantoprazole  40 mg Oral QHS  . pravastatin  20 mg Per Tube q1800  . QUEtiapine  25 mg Oral Daily  . QUEtiapine  50 mg Oral QHS  . sodium chloride  1 g Oral TID WC  . traZODone  50 mg Oral QHS  . valproic acid  500 mg Oral BID   Continuous Infusions: . sodium chloride 250 mL (12/07/18 0943)  . sodium chloride 100 mL/hr at 12/11/18 1031    Active Problems:   Acute hypoxemic respiratory failure (HCC)   LOS: 8 days    A & P   Acute on chronic hypoxemic and hypercarbic respiratory failure due to COPD exacerbation and possible CHF exacerbation. Repeat ABG on 6/13 reassuring.  Blood and sputum cultures negative.  COVID-19 negative.  This is improving.  She is now saturating in mid 90s on 4 L by nasal cannula. Procalcitonin is negligible which makes bacterial pneumonia unlikely. COVID-19 and blood cultures negative. Extubated 12/04/2018. The patient received Vancomycin and cefepime initially in the ICU. That was de-escalated to Rocephin on 6/12. She is also receiving steroids which are being tapered down. Beta blockers were changed from Coreg to metoprolol for beta 1 selectivity. She has been continued on Brovana, budesonide and DuoNeb. Diazepam was decreased from 2 mg bid due to somnolence.  Acute on chronic hyponatremia: Sodium has been low chronically. It was 118 upon admission but did increase to 129 on 12/06/2018. This morning after diuresis was initiated on 12/07/2018, it has dropped back down to 117. NS has been started, and the patient's oral NaCl has been restarted. Will recheck sodium frequently. She is appropriate for discharge to SNF, but will need second CIVID-19 before she can go to SNF.  Acute metabolic encephalopathy: Resolved. Multifactorial including acute respiratory  failure, UTI and medications. Thyroid panel consistent with euthyroid syndrome.  She is somnolent this morning. Reduce Valium to 2 mg twice daily on 6/13. Nightly gabapentin reduced to 100 mg. Trazadone reduced to 50 mg. Mucinex DM changed to guaifenesin only. Oxycodone has been reduced to 5 mg from q8 to q12 prn pain. Correct sodium appropriately. Frequent reorientation.  Aggression and agitation: appears improved. Seroquel added, but did not improve her behavior. IV Haldol has been added. The patient is receiving Depakote as well which should help with moderation of her behavior.  Acute on chronic diastolic CHF: Echo on 7/34 with EF greater than 65% and no significant structural abnormalities.  Patient was hypertensive on arrival.  CXR with bibasilar opacities.  BNP marginally elevated.  UOP -3.2L/24 hours.  Renal function is stable.  Hyponatremia worse.  Respiratory status improved. Lasix held due to low sodium. Monitor volume status, electrolytes, renal status,  and respiratory status. Restart lasix at a lower dose for discharge.  E. coli UTI: She denies dysuria today.  No fever or leukocytosis. She has completed antibiotic course with ceftriaxone.  Depression/anxiety/schizoaffective disorder: Continue home Depakote, levels were low. Continue home carcinoma pain, Latuda and Paxil. Reduced Valium, gabapentin and trazodone.  Chronic pain/chronic opiate use: Scheduled Tylenol 650 mg every 8 hours. Gabapentin and oxycodone reduced as above in the setting of somnolence.  Essential hypertension: Vented.  On Procardia, labetalol, and hydralazine at home. Metoprolol 12.5 mg twice daily-beta-1 selective. Resumed home Procardia  GERD/constipation: PPI. Scheduled MiraLAX x3 until she has bowel movement.  Inadequate oral intake related to acute illness: Appreciate nutrition input-Magic cup and supplements.  I have seen and examined this patient myself. I have spent 45 minutes in her evaluation and  care. I have discussed the patient in detail with her Gertie Exon. All questions were answered to the best of my ability.  DVT prophylaxis: Heparin Code Status: Full Code Family Communication: I have discussed the patient with her Gertie Exon as the patient had requested. Disposition Plan: SNF   Natalie Scalia, DO [facility] Direct contact: see [email or web address]  7PM-7AM contact night coverage as above 12/11/2018, 1:27 PM  LOS: 1 days

## 2018-12-11 NOTE — Discharge Summary (Addendum)
Physician Discharge Summary  Natalie Thomas NFA:[id number] DOB: 05/22/1961 DOA: 12/03/2018  PCP: System, Pcp Not In  Admit date: 12/03/2018 Discharge date: 12/12/2018  Recommendations for Outpatient Follow-up:  1. Patient is to have PT/OT eval and treat at SNF. 2. Pt is to follow up with PCP in 7-10 days after discharge from SNF.  Contact information for after-discharge care    Destination    [facility] Preferred SNF .   Service: Skilled Nursing Contact information: [address] [place] [phone]               Discharge Diagnoses: Principal diagnosis is #1 1. Acute on chronic respiratory failure. 2. COPD exacerbation 3. Acute on chronic hyponatremia 4. Acute on chronic diastolic CHF. 5. UTI. 6. Depression/Anxiety/Schizophrenia  Discharge Condition: Fair Disposition: SNF  Diet recommendation: Heart healthy  Filed Weights   12/09/18 0353 12/10/18 0412 12/11/18 0318  Weight: 92.9 kg 97.7 kg 100.5 kg    History of present illness:  Natalie Thomas is a 58 y.o. female who has a PMH including but not limited to HTN, HLD, CHF, COPD, chronic hyponatremia, polycythemia, depression, schizoaffective disorder (see "past medical history" for rest).  She resides at [facility] and she presented to [facility] ED 6/10 with dyspnea and acute hypoxic respiratory failure (sats of 60% on RA).  She apparently had CXR at the facility which showed pulmonary edema.  In ED, she required intubation.  Per RN, she had moderate secretions during intubation.  She was initially hypertensive to 170's; however, SBP dropped to 90's with propofol.  HR also dropped down to as low as 40.  COVID was obtained; however, it was the send out lab.  Rapid COVID was added after PCCM evaluated in ED.  No known fevers, chills, sweats, chest pain, exposures to known sick contacts / COVID positive pts.  Hospital Course:  The patient was initially  admitted to the ICU. Patient was extubated on 12/04/2018. TR[facility] assumed care on 12/06/2018. Sedating medications have been minimized due to the patient's decreased level of consciousness.   Sodium has been low chronically. It was 118 upon admission but did increase to 129 on 12/06/2018. This morning after diuresis was initiated on 12/07/2018, it has dropped back down to 117. NS has been started, and the patient's oral NaCl has been restarted. Today it is up to 131.  Repeat COVID-19 was returned negative on 12/11/2018.  Today's assessment: S: The patient is resting comfortably. No new complaints. O: Vitals:  Vitals:   12/11/18 0736 12/11/18 1201  BP:  (!) 187/51  Pulse:  65  Resp:  18  Temp:  97.8 F (36.6 C)  SpO2: 96% 97%    Constitutional:   The patient is awake, alert, and oriented x 3. No acute distress. Respiratory:   No increased work of breathing.  No wheezes, rales, or rhonchi.  No tactile fremitus. Cardiovascular:   Regular rate and rhythm  No LE extremity edema    Normal pedal pulses Abdomen:   Abdomen is soft, non-tender, non-distended  No hernias, masses, or organomegaly  Normoactive bowel sounds. Musculoskeletal:   No cyanosis, clubbing or edema Skin:   No rashes, lesions, ulcers  palpation of skin: no induration or nodules Neurologic:   CN 2-12 intact  Sensation all 4 extremities intact  Discharge Instructions  Discharge Instructions    Activity as tolerated - No restrictions   Complete by: As directed    Call MD for:  difficulty  breathing, headache or visual disturbances   Complete by: As directed    Call MD for:  persistant nausea and vomiting   Complete by: As directed    Diet - low sodium heart healthy   Complete by: As directed    Discharge instructions   Complete by: As directed    PT/OT eval and treat. Follow up with PCP in 7-10 days after discharge.   Increase activity slowly   Complete by: As directed      Allergies as of  12/11/2018      Reactions   Clarithromycin Itching   Penicillins Itching   Has tolerated cefazolin before Has patient had a PCN reaction causing immediate rash, facial/tongue/throat swelling, SOB or lightheadedness with hypotension: No Has patient had a PCN reaction causing severe rash involving mucus membranes or skin necrosis: No Has patient had a PCN reaction that required hospitalization: Unknown Has patient had a PCN reaction occurring within the last 10 years: No If all of the above answers are "NO", then may proceed with Cephalosporin use.      Medication List    STOP taking these medications   albuterol 108 (90 Base) MCG/ACT inhaler Commonly known as: VENTOLIN HFA Replaced by: albuterol (2.5 MG/3ML) 0.083% nebulizer solution   dicyclomine 20 MG tablet Commonly known as: BENTYL   divalproex 250 MG DR tablet Commonly known as: DEPAKOTE   divalproex 500 MG DR tablet Commonly known as: DEPAKOTE   loperamide 2 MG tablet Commonly known as: IMODIUM A-D   loratadine 10 MG tablet Commonly known as: CLARITIN   magnesium oxide 400 (241.3 Mg) MG tablet Commonly known as: MAG-OX   Melatonin 10 MG Tabs   neomycin-bacitracin-polymyxin ointment Commonly known as: NEOSPORIN   nystatin cream Commonly known as: MYCOSTATIN   oyster calcium 500 MG Tabs tablet   PARoxetine 20 MG tablet Commonly known as: PAXIL   PARoxetine 30 MG tablet Commonly known as: PAXIL   PROBIOTIC PO     TAKE these medications   acetaminophen 325 MG tablet Commonly known as: TYLENOL Take 650 mg by mouth 2 (two) times daily as needed (pain). Do not exceed 3 grams/24 hours of tylenol from all sources What changed: Another medication with the same name was removed. Continue taking this medication, and follow the directions you see here.   albuterol (2.5 MG/3ML) 0.083% nebulizer solution Commonly known as: PROVENTIL Take 3 mLs (2.5 mg total) by nebulization every 4 (four) hours as needed for  wheezing or shortness of breath. Replaces: albuterol 108 (90 Base) MCG/ACT inhaler   aluminum-magnesium hydroxide-simethicone 540-086-76 MG/5ML Susp Commonly known as: MAALOX Take 30 mLs by mouth 4 (four) times daily as needed (heartburn, indigestion).   arformoterol 15 MCG/2ML Nebu Commonly known as: BROVANA Take 2 mLs (15 mcg total) by nebulization 2 (two) times daily.   aspirin 81 MG tablet Take 1 tablet (81 mg total) by mouth daily. For heart health   benzonatate 100 MG capsule Commonly known as: TESSALON Take 1 capsule (100 mg total) by mouth 3 (three) times daily as needed for cough.   Breo Ellipta 100-25 MCG/INH Aepb Generic drug: fluticasone furoate-vilanterol Inhale 1 puff into the lungs daily.   budesonide 0.5 MG/2ML nebulizer solution Commonly known as: PULMICORT Take 2 mLs (0.5 mg total) by nebulization 2 (two) times daily.   buPROPion 150 MG 24 hr tablet Commonly known as: WELLBUTRIN XL Take 150 mg by mouth daily. What changed: Another medication with the same name was removed. Continue taking this  medication, and follow the directions you see here.   calcium carbonate 500 MG chewable tablet Commonly known as: TUMS - dosed in mg elemental calcium Chew 1 tablet (200 mg of elemental calcium total) by mouth 2 (two) times daily as needed for indigestion or heartburn.   diazepam 2 MG tablet Commonly known as: VALIUM Take 1 tablet (2 mg total) by mouth 2 (two) times a day. What changed:   medication strength  how much to take   docusate sodium 100 MG capsule Commonly known as: COLACE Take 100 mg by mouth 2 (two) times daily as needed (constipation).   ferrous sulfate 325 (65 FE) MG tablet Take 325 mg by mouth daily.   fluticasone 50 MCG/ACT nasal spray Commonly known as: FLONASE Place 2 sprays into both nostrils 2 (two) times daily.   furosemide 20 MG tablet Commonly known as: LASIX Take 20 mg by mouth 2 (two) times daily.   gabapentin 100 MG  capsule Commonly known as: NEURONTIN Take 1 capsule (100 mg total) by mouth at bedtime. What changed:   medication strength  how much to take  when to take this   hydrALAZINE 100 MG tablet Commonly known as: APRESOLINE Take 100 mg by mouth 3 (three) times daily.   hydrOXYzine 25 MG tablet Commonly known as: ATARAX/VISTARIL Take 1 tablet (25 mg total) by mouth 3 (three) times daily as needed for anxiety.   ipratropium-albuterol 0.5-2.5 (3) MG/3ML Soln Commonly known as: DUONEB Take 3 mLs by nebulization See admin instructions. Inhale one vial via hand held nebulizer three times daily, may also use every 8 hours as needed for shortness of breath   lurasidone 40 MG Tabs tablet Commonly known as: LATUDA Take 40 mg by mouth 2 (two) times a day. What changed: Another medication with the same name was removed. Continue taking this medication, and follow the directions you see here.   metoprolol tartrate 25 MG tablet Commonly known as: LOPRESSOR Take 0.5 tablets (12.5 mg total) by mouth 2 (two) times daily.   nicotine 21 mg/24hr patch Commonly known as: NICODERM CQ - dosed in mg/24 hours Place 1 patch (21 mg total) onto the skin daily. Start taking on: December 12, 2018   NIFEdipine 90 MG 24 hr tablet Commonly known as: PROCARDIA XL/NIFEDICAL-XL Take 90 mg by mouth daily.   omeprazole 20 MG capsule Commonly known as: PRILOSEC Take 1 capsule (20 mg total) by mouth 2 (two) times daily before a meal. For acid reflux. What changed: when to take this   oxyCODONE 5 MG immediate release tablet Commonly known as: Oxy IR/ROXICODONE Take 1 tablet (5 mg total) by mouth daily.   polyethylene glycol 17 g packet Commonly known as: MiraLax Take 17 g by mouth daily. What changed:   when to take this  reasons to take this   pravastatin 20 MG tablet Commonly known as: PRAVACHOL Take 20 mg by mouth daily.   predniSONE 20 MG tablet Commonly known as: DELTASONE Take 2 tablets (40 mg  total) by mouth daily for 3 days, THEN 1 tablet (20 mg total) daily for 3 days, THEN 0.5 tablets (10 mg total) daily for 3 days. Start taking on: December 11, 2018   QUEtiapine 50 MG tablet Commonly known as: SEROQUEL Take 1 tablet (50 mg total) by mouth at bedtime.   QUEtiapine 25 MG tablet Commonly known as: SEROQUEL Take 1 tablet (25 mg total) by mouth daily. Start taking on: December 12, 2018   Robafen 100 MG/5ML syrup  Generic drug: guaifenesin Take 200 mg by mouth 3 (three) times daily as needed for cough or congestion.   Saphris 5 MG Subl 24 hr tablet Generic drug: asenapine Place 1 tablet (5 mg total) under the tongue 2 (two) times daily.   sodium chloride 1 g tablet Take 1 tablet (1 g total) by mouth 3 (three) times daily with meals. What changed: when to take this   traZODone 50 MG tablet Commonly known as: DESYREL Take 1 tablet (50 mg total) by mouth at bedtime. What changed:   medication strength  how much to take   valproic acid 250 MG capsule Commonly known as: DEPAKENE Take 2 capsules (500 mg total) by mouth 2 (two) times daily.      Allergies  Allergen Reactions   Clarithromycin Itching   Penicillins Itching    Has tolerated cefazolin before  Has patient had a PCN reaction causing immediate rash, facial/tongue/throat swelling, SOB or lightheadedness with hypotension: No Has patient had a PCN reaction causing severe rash involving mucus membranes or skin necrosis: No Has patient had a PCN reaction that required hospitalization: Unknown Has patient had a PCN reaction occurring within the last 10 years: No If all of the above answers are "NO", then may proceed with Cephalosporin use.     The results of significant diagnostics from this hospitalization (including imaging, microbiology, ancillary and laboratory) are listed below for reference.    Significant Diagnostic Studies: Dg Chest Port 1 View  Result Date: 12/05/2018 CLINICAL DATA:  Acute  respiratory failure EXAM: PORTABLE CHEST 1 VIEW COMPARISON:  12/04/2018 FINDINGS: Cardiac shadow is enlarged but stable. Aortic calcifications are again seen. Bilateral pleural effusions with underlying atelectatic changes are again seen and stable. Endotracheal tube and gastric catheter have been removed in the interval. IMPRESSION: Stable effusions and atelectatic changes. No new focal abnormality is noted. Electronically Signed   By: Inez Catalina M.D.   On: 12/05/2018 07:36   Dg Chest Port 1 View  Result Date: 12/04/2018 CLINICAL DATA:  respiratory failurerespiratory failure EXAM: PORTABLE CHEST 1 VIEW COMPARISON:  12/03/2018 FINDINGS: Endotracheal tube appears in good position. NG tube extends into the stomach. Stable cardiomegaly. There is bibasilar atelectasis and bibasilar pleural effusions. Upper lungs are clear. IMPRESSION: 1. No significant change. 2. Stable support apparatus. 3. bilateral pleural effusions and bibasilar atelectasis Electronically Signed   By: Suzy Bouchard M.D.   On: 12/04/2018 08:42   Dg Chest Portable 1 View  Result Date: 12/03/2018 CLINICAL DATA:  Hypoxia EXAM: PORTABLE CHEST 1 VIEW COMPARISON:  Nov 08, 2018 FINDINGS: Endotracheal tube tip is 1.2 cm above the carina. Nasogastric tube tip and side port are below the diaphragm. No pneumothorax. There is pleural effusion with airspace consolidation in much of the right middle and lower lobes. There is consolidation in the medial left base. There is cardiomegaly with pulmonary vascularity normal. There is aortic atherosclerosis. No adenopathy. No bone lesions. IMPRESSION: Tube positions as described without pneumothorax. Note that the endotracheal tube tip is near the carina; it may be prudent to consider withdrawing endotracheal tube 2 to 2.5 cm. Right pleural effusion with consolidation throughout much of the right middle and lower lobes. There is also consolidation in the medial left base. There is stable cardiomegaly. Aortic  Atherosclerosis (ICD10-I70.0). Electronically Signed   By: Lowella Grip III M.D.   On: 12/03/2018 15:47   Dg Chest Port 1 View  Result Date: 12/03/2018 CLINICAL DATA:  Oxygen desaturation. EXAM: PORTABLE CHEST 1 VIEW COMPARISON:  Single-view of the chest 11/08/2018. PA and lateral chest 07/04/2018 and 06/09/2018. CT chest 05/14/2018. FINDINGS: The lungs are emphysematous. There is marked cardiomegaly. Mild interstitial edema is present. Hazy opacities in the lung bases likely represent small effusions. No pneumothorax. Atherosclerosis noted. No acute bony abnormality. IMPRESSION: Cardiomegaly with mild interstitial edema and small pleural effusions. Atherosclerosis. Emphysema. Electronically Signed   By: Inge Rise M.D.   On: 12/03/2018 10:35    Microbiology: Recent Results (from the past 240 hour(s))  Novel Coronavirus,NAA,(SEND-OUT TO REF LAB - TAT 24-48 hrs); Hosp Order     Status: None   Collection Time: 12/03/18 10:19 AM   Specimen: Nasopharyngeal Swab; Respiratory  Result Value Ref Range Status   SARS-CoV-2, NAA NOT DETECTED NOT DETECTED Final    Comment: (NOTE) This test was developed and its performance characteristics determined by [employer]. This test has not been FDA cleared or approved. This test has been authorized by FDA under an Emergency Use Authorization (EUA). This test is only authorized for the duration of time the declaration that circumstances exist justifying the authorization of the emergency use of in vitro diagnostic tests for detection of SARS-CoV-2 virus and/or diagnosis of COVID-19 infection under section 564(b)(1) of the Act, 21 U.S.C. 762GBT-5(V)(7), unless the authorization is terminated or revoked sooner. When diagnostic testing is negative, the possibility of a false negative result should be considered in the context of a patient's recent exposures and the presence of clinical signs and symptoms consistent with COVID-19. An  individual without symptoms of COVID-19 and who is not shedding SARS-CoV-2 virus would expect to have a negative (not detected) result in this assay. Performed  At: [facility] [address] [place], [place] [id number] Rush Farmer MD GY:[id number]    [place]  Final    Comment: Performed at [facility], [place] [address] [place], [place] [address]  SARS Coronavirus 2     Status: None   Collection Time: 12/03/18  4:18 PM  Result Value Ref Range Status   SARS Coronavirus 2 NOT DETECTED NOT DETECTED Final    Comment: (NOTE) SARS-CoV-2 target nucleic acids are NOT DETECTED. The SARS-CoV-2 RNA is generally detectable in upper and lower respiratory specimens during the acute phase of infection.  Negative  results do not preclude SARS-CoV-2 infection, do not rule out co-infections with other pathogens, and should not be used as the sole basis for treatment or other patient management decisions.  Negative results must be combined with clinical observations, patient history, and epidemiological information. The expected result is Not Detected. Fact Sheet for Patients: http://www.biofiredefense.com/wp-content/uploads/2020/03/BIOFIRE-COVID -19-patients.pdf Fact Sheet for Healthcare Providers: http://www.biofiredefense.com/wp-content/uploads/2020/03/BIOFIRE-COVID -19-hcp.pdf This test is not yet approved or cleared by the [place] and  has been authorized for detection and/or diagnosis of SARS-CoV-2 by FDA under an Emergency Use Authorization (EUA).  This EUA will remain in effec t (meaning this test can be used) for the duration of  the COVID-19 declaration under Section 564(b)(1) of the Act, 21 U.S.C. section 360bbb-3(b)(1), unless the authorization is terminated or revoked sooner. Performed at [facility], [place] [address], [place], [place] [address]   Culture, blood (routine x 2)     Status: None   Collection Time: 12/03/18   6:15 PM   Specimen: BLOOD  Result Value Ref Range Status   Specimen Description BLOOD SITE NOT SPECIFIED  Final   Special Requests   Final    BOTTLES DRAWN AEROBIC AND ANAEROBIC Blood Culture results may  not be optimal due to an inadequate volume of blood received in culture bottles   Culture   Final    NO GROWTH 6 DAYS Performed at [facility], [place] [address], [place], [place] [address]    Report Status 12/09/2018 FINAL  Final  Culture, blood (routine x 2)     Status: None   Collection Time: 12/03/18  6:24 PM   Specimen: BLOOD  Result Value Ref Range Status   Specimen Description BLOOD SITE NOT SPECIFIED  Final   Special Requests   Final    BOTTLES DRAWN AEROBIC AND ANAEROBIC Blood Culture results may not be optimal due to an inadequate volume of blood received in culture bottles   Culture   Final    NO GROWTH 6 DAYS Performed at [facility], [place] [address] [place], [place] [address]    Report Status 12/09/2018 FINAL  Final  Urine culture     Status: Abnormal   Collection Time: 12/03/18  7:50 PM   Specimen: Urine, Random  Result Value Ref Range Status   Specimen Description URINE, RANDOM  Final   Special Requests   Final    NONE Performed at [facility], [place] [address], [place], [place] [address]    Culture >=100,000 COLONIES/mL ESCHERICHIA COLI (A)  Final   Report Status 12/05/2018 FINAL  Final   Organism ID, Bacteria ESCHERICHIA COLI (A)  Final      Susceptibility   Escherichia coli - MIC*    AMPICILLIN >=32 RESISTANT Resistant     CEFAZOLIN 16 SENSITIVE Sensitive     CEFTRIAXONE <=1 SENSITIVE Sensitive     CIPROFLOXACIN >=4 RESISTANT Resistant     GENTAMICIN >=16 RESISTANT Resistant     IMIPENEM <=0.25 SENSITIVE Sensitive     NITROFURANTOIN <=16 SENSITIVE Sensitive     TRIMETH/SULFA <=20 SENSITIVE Sensitive     AMPICILLIN/SULBACTAM >=32 RESISTANT Resistant     PIP/TAZO 8 SENSITIVE Sensitive     Extended ESBL NEGATIVE Sensitive     * >=100,000  COLONIES/mL ESCHERICHIA COLI  MRSA PCR Screening     Status: Abnormal   Collection Time: 12/03/18  8:27 PM   Specimen: Nasal Mucosa; Nasopharyngeal  Result Value Ref Range Status   MRSA by PCR POSITIVE (A) NEGATIVE Final    Comment:        The GeneXpert MRSA Assay (FDA approved for NASAL specimens only), is one component of a comprehensive MRSA colonization surveillance program. It is not intended to diagnose MRSA infection nor to guide or monitor treatment for MRSA infections. RESULT CALLED TO, READ BACK BY AND VERIFIED WITH: PARRISH,J RN 12/03/2018 AT 2339 SKEEN,P Performed at [facility], [place] [address] [place], [place] [address]   Culture, respiratory (non-expectorated)     Status: None   Collection Time: 12/04/18  9:04 AM   Specimen: Tracheal Aspirate; Respiratory  Result Value Ref Range Status   Specimen Description TRACHEAL ASPIRATE  Final   Special Requests NONE  Final   Gram Stain   Final    RARE WBC PRESENT, PREDOMINANTLY PMN RARE GRAM POSITIVE COCCI    Culture   Final    RARE Consistent with normal respiratory flora. Performed at [facility], [place] [address], [place], [place] [address]    Report Status 12/06/2018 FINAL  Final     Labs: Basic Metabolic Panel: Recent Labs  Lab 12/05/18 0606  12/07/18 1308 12/08/18 6578 12/09/18 0603 12/09/18 1409 12/09/18 2236 12/10/18 4696  12/10/18 1358 12/11/18 0614  NA 129*   < > 128* 123* 117* 117* 117* 124* 123* 131*  K 3.2*   < > 3.8 4.5 4.6 4.8 4.5 4.8 4.8 4.6  CL 84*   < > 78* 76* 75* 75* 77* 83* 84* 90*  CO2 35*   < > 37* 34* 32 34* 28 31 31  34*  GLUCOSE 92   < > 196* 165* 220* 216* 217* 143* 203* 120*  BUN 5*   < > 14 21* 24* 21* 19 19 19 18   CREATININE 0.36*   < > 0.52 0.56 0.70 0.62 0.62 0.76 0.60 0.52  CALCIUM 8.3*   < > 8.9 8.7* 8.3* 8.1* 7.9* 8.3* 8.0* 8.6*  MG 1.7   < > 2.0 1.9 1.8  --   --  1.9  --  2.0  PHOS 4.5  --   --   --   --   --   --   --   --   --    < > = values in this  interval not displayed.   Liver Function Tests: No results for input(s): AST, ALT, ALKPHOS, BILITOT, PROT, ALBUMIN in the last 168 hours. No results for input(s): LIPASE, AMYLASE in the last 168 hours. No results for input(s): AMMONIA in the last 168 hours. CBC: Recent Labs  Lab 12/07/18 0604 12/08/18 0621 12/09/18 0603 12/10/18 0623 12/11/18 0614  WBC 7.7 9.6 8.4 7.0 7.3  HGB 13.6 14.2 14.1 14.4 14.8  HCT 41.4 42.4 40.9 42.8 44.4  MCV 92.8 90.8 89.3 90.1 92.1  PLT 177 189 205 201 205   Cardiac Enzymes: No results for input(s): CKTOTAL, CKMB, CKMBINDEX, TROPONINI in the last 168 hours. BNP: BNP (last 3 results) Recent Labs    06/17/18 2133 12/03/18 0957 12/06/18 0911  BNP 125.4* 296.6* 132.3*    ProBNP (last 3 results) No results for input(s): PROBNP in the last 8760 hours.  CBG: Recent Labs  Lab 12/10/18 1200 12/10/18 1613 12/10/18 2058 12/11/18 0722 12/11/18 1142  GLUCAP 174* 148* 181* 131* 162*    Active Problems:   Acute hypoxemic respiratory failure (HCC)   Time coordinating discharge: 38 minutes.  Signed:        Treyshaun Keatts, DO [facility]  12/12/2018, 2:39 pm

## 2018-12-12 DIAGNOSIS — F329 Major depressive disorder, single episode, unspecified: Secondary | ICD-10-CM

## 2018-12-12 DIAGNOSIS — F419 Anxiety disorder, unspecified: Secondary | ICD-10-CM

## 2018-12-12 LAB — GLUCOSE, CAPILLARY
Glucose-Capillary: 95 mg/dL (ref 70–99)
Glucose-Capillary: 139 mg/dL — ABNORMAL HIGH (ref 70–99)
Glucose-Capillary: 130 mg/dL — ABNORMAL HIGH (ref 70–99)
Glucose-Capillary: 169 mg/dL — ABNORMAL HIGH (ref 70–99)

## 2018-12-12 NOTE — TOC Progression Note (Signed)
Transition of Care [facility]) - Progression Note    Patient Details  Name: Natalie Thomas MRN: [id number] Date of Birth: 1960-10-19  [facility]) CM/SW [place], LCSW Phone Number: 12/12/2018, 5:41 PM  Clinical Narrative:    SNF still awaiting most recent COVID results. Must be within 24-48hrs of discharge.    Expected Discharge Plan: Skilled Nursing Facility Barriers to Discharge: Ship broker, Continued Medical Work up  Expected Discharge Plan and Services Expected Discharge Plan: [place] In-house Referral: Clinical Social Work   Post Acute Care Choice: [place] Living arrangements for the past 2 months: [place] Expected Discharge Date: 12/12/18                                     Social Determinants of Health (SDOH) Interventions    Readmission Risk Interventions Readmission Risk Prevention Plan 12/08/2018  Transportation Screening Complete  Medication Review Press photographer) Complete  PCP or Specialist appointment within 3-5 days of discharge Complete  HRI or Home Care Consult Complete  SW Recovery Care/Counseling Consult Complete  Palliative Care Screening Not [place] Complete  Some recent data might be hidden

## 2018-12-13 LAB — NOVEL CORONAVIRUS, NAA (HOSP ORDER, SEND-OUT TO REF LAB; TAT 18-24 HRS): SARS-CoV-2, NAA: NOT DETECTED

## 2018-12-13 LAB — GLUCOSE, CAPILLARY
Glucose-Capillary: 140 mg/dL — ABNORMAL HIGH (ref 70–99)
Glucose-Capillary: 175 mg/dL — ABNORMAL HIGH (ref 70–99)
Glucose-Capillary: 197 mg/dL — ABNORMAL HIGH (ref 70–99)
Glucose-Capillary: 189 mg/dL — ABNORMAL HIGH (ref 70–99)

## 2018-12-13 NOTE — Progress Notes (Addendum)
PROGRESS NOTE  Natalie Thomas ZGY:[id number] DOB: 1961/05/22 DOA: 12/03/2018 PCP: System, Pcp Not In  Brief History   58 year old female with history of COPD on 2 L, diastolic CHF, hypertension, hyperlipidemia, chronic hyponatremia, polycythemia, depression and schizoaffective disorder admitted with acute on chronic respiratory failure with desaturation to 60% on room air.  CXR showed bibasilar consolidation thought to be due to H CAP.  ABG with chronic respiratory acidosis.  Intubated on 6/10 and admitted to ICU.  Started on broad-spectrum antibiotic.  COVID-19 and blood cultures negative.  Urine showed grew E. coli.  Patient was extubated on 12/04/2018.  TRH assumed care on 12/06/2018. Sedating medications have been minimized due to the patient's decreased level of consciousness.   Sodium has been low chronically. It was 118 upon admission but did increase to 129 on 12/06/2018. This morning after diuresis was initiated on 12/07/2018, it has dropped back down to 117. NS has been started, and the patient's oral NaCl has been restarted. It is up to 131 on 12/11/2018.  Consultants  . None  Procedures  . None  Antibiotics   Anti-infectives (From admission, onward)   Start     Dose/Rate Route Frequency Ordered Stop   12/05/18 1000  cefTRIAXone (ROCEPHIN) 2 g in sodium chloride 0.9 % 100 mL IVPB     2 g 200 mL/hr over 30 Minutes Intravenous Every 24 hours 12/05/18 0814 12/07/18 1015   12/04/18 1800  vancomycin (VANCOCIN) 1,250 mg in sodium chloride 0.9 % 250 mL IVPB  Status:  Discontinued     1,250 mg 166.7 mL/hr over 90 Minutes Intravenous Every 24 hours 12/03/18 1744 12/05/18 1131   12/03/18 1715  vancomycin (VANCOCIN) 1,500 mg in sodium chloride 0.9 % 500 mL IVPB     1,500 mg 250 mL/hr over 120 Minutes Intravenous  Once 12/03/18 1709 12/03/18 2037   12/03/18 1715  ceFEPIme (MAXIPIME) 2 g in sodium chloride 0.9 % 100 mL IVPB  Status:  Discontinued     2 g 200 mL/hr over 30 Minutes  Intravenous Every 8 hours 12/03/18 1709 12/05/18 0814     .   Subjective  The patient is resting quietly. No new complaints.   Objective   Vitals:  Vitals:   12/13/18 0805 12/13/18 1127  BP: (!) 145/61 (!) 175/49  Pulse: 66 (!) 54  Resp: 18 15  Temp: 98.6 F (37 C) 98.3 F (36.8 C)  SpO2: 100% 92%    Exam:  Constitutional:  . The patient is awake, alert, and oriented x 3. No acute distressed. Respiratory:  . No increased work of breathing. . No wheezes, rales, or rhonchi. . No tactile fremitus. Cardiovascular:  . Regular rate and rhythm . No murmurs, ectopy, or gallups. . No lateral PMI. No thrills. Abdomen:  . Abdomen is soft, non-tender, non-distended. . No organomegaly, masses, or hernias are appreciated. . Normoactive bowel sounds. Musculoskeletal:  . No cyanosis, clubbing, or edema Skin:  . No rashes, lesions, ulcers . palpation of skin: no induration or nodules Neurologic:  . CN 2-12 intact . Sensation all 4 extremities intact Psychiatric:  . Mental status o Mood, affect: Anxious/aggressive. o Orientation to person, place, time  . Judgment and insight do not appear intact   I have personally reviewed the following:   Today's Data  . BMP, CBC, Vitals  Micro Data  . Respiratory culture: Positive for normal respiratory culture . Urine culture positive for E. Coli . Blood cultures have had no growth   Scheduled  Meds: . arformoterol  15 mcg Nebulization BID  . asenapine  5 mg Sublingual BID  . aspirin  81 mg Oral Daily  . budesonide (PULMICORT) nebulizer solution  0.5 mg Nebulization BID  . buPROPion  150 mg Oral Daily  . diazepam  2 mg Oral BID  . gabapentin  100 mg Oral QHS  . guaiFENesin  600 mg Oral BID  . heparin  5,000 Units Subcutaneous Q8H  . hydrALAZINE  100 mg Oral Q8H  . insulin aspart  0-5 Units Subcutaneous QHS  . insulin aspart  0-9 Units Subcutaneous TID WC  . ipratropium-albuterol  3 mL Nebulization BID  . lurasidone  40 mg  Oral BID  . mouth rinse  15 mL Mouth Rinse BID  . methylPREDNISolone (SOLU-MEDROL) injection  60 mg Intravenous Q12H  . metoprolol tartrate  12.5 mg Oral BID  . nicotine  21 mg Transdermal Daily  . NIFEdipine  90 mg Oral Daily  . pantoprazole  40 mg Oral QHS  . pravastatin  20 mg Per Tube q1800  . QUEtiapine  25 mg Oral Daily  . QUEtiapine  50 mg Oral QHS  . sodium chloride  1 g Oral TID WC  . traZODone  50 mg Oral QHS  . valproic acid  500 mg Oral BID   Continuous Infusions: . sodium chloride 250 mL (12/07/18 0943)    Active Problems:   Acute hypoxemic respiratory failure (HCC)   LOS: 10 days    A & P   Acute on chronic hypoxemic and hypercarbic respiratory failure due to COPD exacerbation and possible CHF exacerbation. Repeat ABG on 6/13 reassuring.  Blood and sputum cultures negative.  COVID-19 negative.  This is improving.  She is now saturating in mid 90s on 4 L by nasal cannula. Procalcitonin is negligible which makes bacterial pneumonia unlikely. COVID-19 and blood cultures negative. Extubated 12/04/2018. The patient received Vancomycin and cefepime initially in the ICU. That was de-escalated to Rocephin on 6/12. She is also receiving steroids which are being tapered down. Beta blockers were changed from Coreg to metoprolol for beta 1 selectivity. She has been continued on Brovana, budesonide and DuoNeb. Diazepam was decreased from 2 mg bid due to somnolence.  Acute on chronic hyponatremia: Sodium has been low chronically. It was 118 upon admission but did increase to 129 on 12/06/2018. This morning after diuresis was initiated on 12/07/2018, it has dropped back down to 117. NS has been started, and the patient's oral NaCl has been restarted. Will recheck sodium frequently. She is appropriate for discharge to SNF, but will need second CIVID-19 before she can go to SNF.  Acute metabolic encephalopathy: Resolved. Multifactorial including acute respiratory failure, UTI and  medications. Thyroid panel consistent with euthyroid syndrome.  She is somnolent this morning. Reduce Valium to 2 mg twice daily on 6/13. Nightly gabapentin reduced to 100 mg. Trazadone reduced to 50 mg. Mucinex DM changed to guaifenesin only. Oxycodone has been reduced to 5 mg from q8 to q12 prn pain. Correct sodium appropriately. Frequent reorientation.  Aggression and agitation: appears improved. Seroquel added, but did not improve her behavior. IV Haldol has been added. The patient is receiving Depakote as well which should help with moderation of her behavior.  Acute on chronic diastolic CHF: Echo on 2/20 with EF greater than 65% and no significant structural abnormalities.  Patient was hypertensive on arrival.  CXR with bibasilar opacities.  BNP marginally elevated.  UOP -3.2L/24 hours.  Renal function is stable.  Hyponatremia worse.  Respiratory status improved. Lasix held due to low sodium. Monitor volume status, electrolytes, renal status,  and respiratory status. Restart lasix at a lower dose for discharge.  E. coli UTI: She denies dysuria today.  No fever or leukocytosis. She has completed antibiotic course with ceftriaxone.  Depression/anxiety/schizoaffective disorder: Continue home Depakote, levels were low. Continue home carcinoma pain, Latuda and Paxil. Reduced Valium, gabapentin and trazodone.  Chronic pain/chronic opiate use: Scheduled Tylenol 650 mg every 8 hours. Gabapentin and oxycodone reduced as above in the setting of somnolence.  Essential hypertension: Vented.  On Procardia, labetalol, and hydralazine at home. Metoprolol 12.5 mg twice daily-beta-1 selective. Resumed home Procardia  GERD/constipation: PPI. Scheduled MiraLAX x3 until she has bowel movement.  Inadequate oral intake related to acute illness: Appreciate nutrition input-Magic cup and supplements.  I have seen and examined this patient myself. I have spent 32 minutes in her evaluation and care.   DVT  prophylaxis: Heparin Code Status: Full Code Family Communication: I have discussed the patient with her Gertie Exon as the patient had requested. Disposition Plan: SNF   Chrystle Murillo, DO [facility] Direct contact: see [email or web address]  7PM-7AM contact night coverage as above  12/12/2018, 1:27 PM  LOS: 1 days

## 2018-12-13 NOTE — Progress Notes (Signed)
As of 3:08pm, the COVID screen has not come back. The patient should be able to discharge back to her facility once the result has come back.   CSW will continue to assist with discharge.   Domenic Schwab, MSW, [name]

## 2018-12-13 NOTE — Discharge Summary (Signed)
Physician Discharge Summary  [name] QBH:[id number] DOB: 1960-12-06 DOA: 12/03/2018  PCP: System, Pcp Not In  Admit date: 12/03/2018 Discharge date: 12/12/2018  Recommendations for Outpatient Follow-up:  1. Patient is to have PT/OT eval and treat at SNF. 2. Pt is to follow up with PCP in 7-10 days after discharge from SNF.  Contact information for after-discharge care    Destination    [facility] Preferred SNF .   Service: Skilled Nursing Contact information: [address] [place] [phone]               Discharge Diagnoses: Principal diagnosis is #1 1. Acute on chronic respiratory failure. 2. COPD exacerbation 3. Acute on chronic hyponatremia 4. Acute on chronic diastolic CHF. 5. UTI. 6. Depression/Anxiety/Schizophrenia  Discharge Condition: Fair Disposition: SNF  Diet recommendation: Heart healthy  Filed Weights   12/10/18 0412 12/11/18 0318 12/13/18 0300  Weight: 97.7 kg 100.5 kg 100.6 kg    History of present illness:  Natalie Thomas is a 58 y.o. female who has a PMH including but not limited to HTN, HLD, CHF, COPD, chronic hyponatremia, polycythemia, depression, schizoaffective disorder (see "past medical history" for rest).  She resides at [facility] and she presented to [facility] ED 6/10 with dyspnea and acute hypoxic respiratory failure (sats of 60% on RA).  She apparently had CXR at the facility which showed pulmonary edema.  In ED, she required intubation.  Per RN, she had moderate secretions during intubation.  She was initially hypertensive to 170's; however, SBP dropped to 90's with propofol.  HR also dropped down to as low as 40.  COVID was obtained; however, it was the send out lab.  Rapid COVID was added after PCCM evaluated in ED.  No known fevers, chills, sweats, chest pain, exposures to known sick contacts / COVID positive pts.  Hospital Course:  The patient was initially  admitted to the ICU. Patient was extubated on 12/04/2018. [facility] assumed care on 12/06/2018. Sedating medications have been minimized due to the patient's decreased level of consciousness.   Sodium has been low chronically. It was 118 upon admission but did increase to 129 on 12/06/2018. This morning after diuresis was initiated on 12/07/2018, it has dropped back down to 117. NS has been started, and the patient's oral NaCl has been restarted. Today it is up to 131.  Repeat COVID-19 was returned negative on 12/11/2018.  Today's assessment: S: The patient is resting comfortably. No new complaints. O: Vitals:  Vitals:   12/13/18 0805 12/13/18 1127  BP: (!) 145/61 (!) 175/49  Pulse: 66 (!) 54  Resp: 18 15  Temp: 98.6 F (37 C) 98.3 F (36.8 C)  SpO2: 100% 92%    Constitutional:  . The patient is awake, alert, and oriented x 3. No acute distress. Respiratory:  . No increased work of breathing. . No wheezes, rales, or rhonchi. . No tactile fremitus. Cardiovascular:  . Regular rate and rhythm . No LE extremity edema   . Normal pedal pulses Abdomen:  . Abdomen is soft, non-tender, non-distended . No hernias, masses, or organomegaly . Normoactive bowel sounds. Musculoskeletal:  . No cyanosis, clubbing or edema Skin:  . No rashes, lesions, ulcers . palpation of skin: no induration or nodules Neurologic:  . CN 2-12 intact . Sensation all 4 extremities intact  Discharge Instructions  Discharge Instructions    (HEART FAILURE PATIENTS) Call MD:  Anytime you have any of the following symptoms: 1)  3 pound weight gain in 24 hours or 5 pounds in 1 week 2) shortness of breath, with or without a dry hacking cough 3) swelling in the hands, feet or stomach 4) if you have to sleep on extra pillows at night in order to breathe.   Complete by: As directed    Activity as tolerated - No restrictions   Complete by: As directed    Activity as tolerated - No restrictions   Complete by: As directed     Call MD for:  difficulty breathing, headache or visual disturbances   Complete by: As directed    Call MD for:  difficulty breathing, headache or visual disturbances   Complete by: As directed    Call MD for:  persistant nausea and vomiting   Complete by: As directed    Call MD for:  persistant nausea and vomiting   Complete by: As directed    Call MD for:  severe uncontrolled pain   Complete by: As directed    Diet - low sodium heart healthy   Complete by: As directed    Discharge instructions   Complete by: As directed    PT/OT eval and treat. Follow up with PCP in 7-10 days after discharge.   Discharge instructions   Complete by: As directed    Follow up with PCP in 7-10 days. Daily weights.   Increase activity slowly   Complete by: As directed    Increase activity slowly   Complete by: As directed      Allergies as of 12/13/2018      Reactions   Clarithromycin Itching   Penicillins Itching   Has tolerated cefazolin before Has patient had a PCN reaction causing immediate rash, facial/tongue/throat swelling, SOB or lightheadedness with hypotension: No Has patient had a PCN reaction causing severe rash involving mucus membranes or skin necrosis: No Has patient had a PCN reaction that required hospitalization: Unknown Has patient had a PCN reaction occurring within the last 10 years: No If all of the above answers are "NO", then may proceed with Cephalosporin use.      Medication List    STOP taking these medications   albuterol 108 (90 Base) MCG/ACT inhaler Commonly known as: VENTOLIN HFA Replaced by: albuterol (2.5 MG/3ML) 0.083% nebulizer solution   dicyclomine 20 MG tablet Commonly known as: BENTYL   divalproex 250 MG DR tablet Commonly known as: DEPAKOTE   divalproex 500 MG DR tablet Commonly known as: DEPAKOTE   loperamide 2 MG tablet Commonly known as: IMODIUM A-D   loratadine 10 MG tablet Commonly known as: CLARITIN   magnesium oxide 400 (241.3 Mg)  MG tablet Commonly known as: MAG-OX   Melatonin 10 MG Tabs   neomycin-bacitracin-polymyxin ointment Commonly known as: NEOSPORIN   nystatin cream Commonly known as: MYCOSTATIN   oyster calcium 500 MG Tabs tablet   PARoxetine 20 MG tablet Commonly known as: PAXIL   PARoxetine 30 MG tablet Commonly known as: PAXIL   PROBIOTIC PO     TAKE these medications   acetaminophen 325 MG tablet Commonly known as: TYLENOL Take 650 mg by mouth 2 (two) times daily as needed (pain). Do not exceed 3 grams/24 hours of tylenol from all sources What changed: Another medication with the same name was removed. Continue taking this medication, and follow the directions you see here.   albuterol (2.5 MG/3ML) 0.083% nebulizer solution Commonly known as: PROVENTIL Take 3 mLs (2.5 mg total) by nebulization every 4 (four) hours as  needed for wheezing or shortness of breath. Replaces: albuterol 108 (90 Base) MCG/ACT inhaler   aluminum-magnesium hydroxide-simethicone 557-322-02 MG/5ML Susp Commonly known as: MAALOX Take 30 mLs by mouth 4 (four) times daily as needed (heartburn, indigestion).   arformoterol 15 MCG/2ML Nebu Commonly known as: BROVANA Take 2 mLs (15 mcg total) by nebulization 2 (two) times daily.   aspirin 81 MG tablet Take 1 tablet (81 mg total) by mouth daily. For heart health   benzonatate 100 MG capsule Commonly known as: TESSALON Take 1 capsule (100 mg total) by mouth 3 (three) times daily as needed for cough.   Breo Ellipta 100-25 MCG/INH Aepb Generic drug: fluticasone furoate-vilanterol Inhale 1 puff into the lungs daily.   budesonide 0.5 MG/2ML nebulizer solution Commonly known as: PULMICORT Take 2 mLs (0.5 mg total) by nebulization 2 (two) times daily.   buPROPion 150 MG 24 hr tablet Commonly known as: WELLBUTRIN XL Take 150 mg by mouth daily. What changed: Another medication with the same name was removed. Continue taking this medication, and follow the directions  you see here.   calcium carbonate 500 MG chewable tablet Commonly known as: TUMS - dosed in mg elemental calcium Chew 1 tablet (200 mg of elemental calcium total) by mouth 2 (two) times daily as needed for indigestion or heartburn.   diazepam 2 MG tablet Commonly known as: VALIUM Take 1 tablet (2 mg total) by mouth 2 (two) times a day. What changed:   medication strength  how much to take   docusate sodium 100 MG capsule Commonly known as: COLACE Take 100 mg by mouth 2 (two) times daily as needed (constipation).   ferrous sulfate 325 (65 FE) MG tablet Take 325 mg by mouth daily.   fluticasone 50 MCG/ACT nasal spray Commonly known as: FLONASE Place 2 sprays into both nostrils 2 (two) times daily.   furosemide 20 MG tablet Commonly known as: LASIX Take 20 mg by mouth 2 (two) times daily.   gabapentin 100 MG capsule Commonly known as: NEURONTIN Take 1 capsule (100 mg total) by mouth at bedtime. What changed:   medication strength  how much to take  when to take this   hydrALAZINE 100 MG tablet Commonly known as: APRESOLINE Take 100 mg by mouth 3 (three) times daily.   hydrOXYzine 25 MG tablet Commonly known as: ATARAX/VISTARIL Take 1 tablet (25 mg total) by mouth 3 (three) times daily as needed for anxiety.   ipratropium-albuterol 0.5-2.5 (3) MG/3ML Soln Commonly known as: DUONEB Take 3 mLs by nebulization See admin instructions. Inhale one vial via hand held nebulizer three times daily, may also use every 8 hours as needed for shortness of breath   lurasidone 40 MG Tabs tablet Commonly known as: LATUDA Take 40 mg by mouth 2 (two) times a day. What changed: Another medication with the same name was removed. Continue taking this medication, and follow the directions you see here.   metoprolol tartrate 25 MG tablet Commonly known as: LOPRESSOR Take 0.5 tablets (12.5 mg total) by mouth 2 (two) times daily.   nicotine 21 mg/24hr patch Commonly known as:  NICODERM CQ - dosed in mg/24 hours Place 1 patch (21 mg total) onto the skin daily.   NIFEdipine 90 MG 24 hr tablet Commonly known as: PROCARDIA XL/NIFEDICAL-XL Take 90 mg by mouth daily.   omeprazole 20 MG capsule Commonly known as: PRILOSEC Take 1 capsule (20 mg total) by mouth 2 (two) times daily before a meal. For acid reflux. What changed:  when to take this   oxyCODONE 5 MG immediate release tablet Commonly known as: Oxy IR/ROXICODONE Take 1 tablet (5 mg total) by mouth daily.   polyethylene glycol 17 g packet Commonly known as: MiraLax Take 17 g by mouth daily. What changed:   when to take this  reasons to take this   pravastatin 20 MG tablet Commonly known as: PRAVACHOL Take 20 mg by mouth daily.   predniSONE 20 MG tablet Commonly known as: DELTASONE Take 2 tablets (40 mg total) by mouth daily for 3 days, THEN 1 tablet (20 mg total) daily for 3 days, THEN 0.5 tablets (10 mg total) daily for 3 days. Start taking on: December 11, 2018   QUEtiapine 50 MG tablet Commonly known as: SEROQUEL Take 1 tablet (50 mg total) by mouth at bedtime.   QUEtiapine 25 MG tablet Commonly known as: SEROQUEL Take 1 tablet (25 mg total) by mouth daily.   Robafen 100 MG/5ML syrup Generic drug: guaifenesin Take 200 mg by mouth 3 (three) times daily as needed for cough or congestion.   Saphris 5 MG Subl 24 hr tablet Generic drug: asenapine Place 1 tablet (5 mg total) under the tongue 2 (two) times daily.   sodium chloride 1 g tablet Take 1 tablet (1 g total) by mouth 3 (three) times daily with meals. What changed: when to take this   traZODone 50 MG tablet Commonly known as: DESYREL Take 1 tablet (50 mg total) by mouth at bedtime. What changed:   medication strength  how much to take   valproic acid 250 MG capsule Commonly known as: DEPAKENE Take 2 capsules (500 mg total) by mouth 2 (two) times daily.      Allergies  Allergen Reactions  . Clarithromycin Itching  .  Penicillins Itching    Has tolerated cefazolin before  Has patient had a PCN reaction causing immediate rash, facial/tongue/throat swelling, SOB or lightheadedness with hypotension: No Has patient had a PCN reaction causing severe rash involving mucus membranes or skin necrosis: No Has patient had a PCN reaction that required hospitalization: Unknown Has patient had a PCN reaction occurring within the last 10 years: No If all of the above answers are "NO", then may proceed with Cephalosporin use.     The results of significant diagnostics from this hospitalization (including imaging, microbiology, ancillary and laboratory) are listed below for reference.    Significant Diagnostic Studies: Dg Chest Port 1 View  Result Date: 12/05/2018 CLINICAL DATA:  Acute respiratory failure EXAM: PORTABLE CHEST 1 VIEW COMPARISON:  12/04/2018 FINDINGS: Cardiac shadow is enlarged but stable. Aortic calcifications are again seen. Bilateral pleural effusions with underlying atelectatic changes are again seen and stable. Endotracheal tube and gastric catheter have been removed in the interval. IMPRESSION: Stable effusions and atelectatic changes. No new focal abnormality is noted. Electronically Signed   By: Inez Catalina M.D.   On: 12/05/2018 07:36   Dg Chest Port 1 View  Result Date: 12/04/2018 CLINICAL DATA:  respiratory failurerespiratory failure EXAM: PORTABLE CHEST 1 VIEW COMPARISON:  12/03/2018 FINDINGS: Endotracheal tube appears in good position. NG tube extends into the stomach. Stable cardiomegaly. There is bibasilar atelectasis and bibasilar pleural effusions. Upper lungs are clear. IMPRESSION: 1. No significant change. 2. Stable support apparatus. 3. bilateral pleural effusions and bibasilar atelectasis Electronically Signed   By: Suzy Bouchard M.D.   On: 12/04/2018 08:42   Dg Chest Portable 1 View  Result Date: 12/03/2018 CLINICAL DATA:  Hypoxia EXAM: PORTABLE CHEST 1 VIEW  COMPARISON:  Nov 08, 2018  FINDINGS: Endotracheal tube tip is 1.2 cm above the carina. Nasogastric tube tip and side port are below the diaphragm. No pneumothorax. There is pleural effusion with airspace consolidation in much of the right middle and lower lobes. There is consolidation in the medial left base. There is cardiomegaly with pulmonary vascularity normal. There is aortic atherosclerosis. No adenopathy. No bone lesions. IMPRESSION: Tube positions as described without pneumothorax. Note that the endotracheal tube tip is near the carina; it may be prudent to consider withdrawing endotracheal tube 2 to 2.5 cm. Right pleural effusion with consolidation throughout much of the right middle and lower lobes. There is also consolidation in the medial left base. There is stable cardiomegaly. Aortic Atherosclerosis (ICD10-I70.0). Electronically Signed   By: Lowella Grip III M.D.   On: 12/03/2018 15:47   Dg Chest Port 1 View  Result Date: 12/03/2018 CLINICAL DATA:  Oxygen desaturation. EXAM: PORTABLE CHEST 1 VIEW COMPARISON:  Single-view of the chest 11/08/2018. PA and lateral chest 07/04/2018 and 06/09/2018. CT chest 05/14/2018. FINDINGS: The lungs are emphysematous. There is marked cardiomegaly. Mild interstitial edema is present. Hazy opacities in the lung bases likely represent small effusions. No pneumothorax. Atherosclerosis noted. No acute bony abnormality. IMPRESSION: Cardiomegaly with mild interstitial edema and small pleural effusions. Atherosclerosis. Emphysema. Electronically Signed   By: Inge Rise M.D.   On: 12/03/2018 10:35    Microbiology: Recent Results (from the past 240 hour(s))  SARS Coronavirus 2     Status: None   Collection Time: 12/03/18  4:18 PM  Result Value Ref Range Status   SARS Coronavirus 2 NOT DETECTED NOT DETECTED Final    Comment: (NOTE) SARS-CoV-2 target nucleic acids are NOT DETECTED. The SARS-CoV-2 RNA is generally detectable in upper and lower respiratory specimens during the acute  phase of infection.  Negative  results do not preclude SARS-CoV-2 infection, do not rule out co-infections with other pathogens, and should not be used as the sole basis for treatment or other patient management decisions.  Negative results must be combined with clinical observations, patient history, and epidemiological information. The expected result is Not Detected. Fact Sheet for Patients: http://www.biofiredefense.com/wp-content/uploads/2020/03/BIOFIRE-COVID -19-patients.pdf Fact Sheet for Healthcare Providers: http://www.biofiredefense.com/wp-content/uploads/2020/03/BIOFIRE-COVID -19-hcp.pdf This test is not yet approved or cleared by the [place] and  has been authorized for detection and/or diagnosis of SARS-CoV-2 by FDA under an Emergency Use Authorization (EUA).  This EUA will remain in effec t (meaning this test can be used) for the duration of  the COVID-19 declaration under Section 564(b)(1) of the Act, 21 U.S.C. section 360bbb-3(b)(1), unless the authorization is terminated or revoked sooner. Performed at [facility], [place] [address], [place], [place] [address]   Culture, blood (routine x 2)     Status: None   Collection Time: 12/03/18  6:15 PM   Specimen: BLOOD  Result Value Ref Range Status   Specimen Description BLOOD SITE NOT SPECIFIED  Final   Special Requests   Final    BOTTLES DRAWN AEROBIC AND ANAEROBIC Blood Culture results may not be optimal due to an inadequate volume of blood received in culture bottles   Culture   Final    NO GROWTH 6 DAYS Performed at [facility], [place] [address], [place], [place] [address]    Report Status 12/09/2018 FINAL  Final  Culture, blood (routine x 2)     Status: None   Collection Time: 12/03/18  6:24 PM   Specimen: BLOOD  Result Value Ref Range Status   Specimen Description BLOOD SITE NOT SPECIFIED  Final   Special Requests   Final    BOTTLES DRAWN AEROBIC AND ANAEROBIC Blood Culture results may not  be optimal due to an inadequate volume of blood received in culture bottles   Culture   Final    NO GROWTH 6 DAYS Performed at [facility], [place] [address], [place], [place] [address]    Report Status 12/09/2018 FINAL  Final  Urine culture     Status: Abnormal   Collection Time: 12/03/18  7:50 PM   Specimen: Urine, Random  Result Value Ref Range Status   Specimen Description URINE, RANDOM  Final   Special Requests   Final    NONE Performed at [facility], [place] [address] [place], [place] [address]    Culture >=100,000 COLONIES/mL ESCHERICHIA COLI (A)  Final   Report Status 12/05/2018 FINAL  Final   Organism ID, Bacteria ESCHERICHIA COLI (A)  Final      Susceptibility   Escherichia coli - MIC*    AMPICILLIN >=32 RESISTANT Resistant     CEFAZOLIN 16 SENSITIVE Sensitive     CEFTRIAXONE <=1 SENSITIVE Sensitive     CIPROFLOXACIN >=4 RESISTANT Resistant     GENTAMICIN >=16 RESISTANT Resistant     IMIPENEM <=0.25 SENSITIVE Sensitive     NITROFURANTOIN <=16 SENSITIVE Sensitive     TRIMETH/SULFA <=20 SENSITIVE Sensitive     AMPICILLIN/SULBACTAM >=32 RESISTANT Resistant     PIP/TAZO 8 SENSITIVE Sensitive     Extended ESBL NEGATIVE Sensitive     * >=100,000 COLONIES/mL ESCHERICHIA COLI  MRSA PCR Screening     Status: Abnormal   Collection Time: 12/03/18  8:27 PM   Specimen: Nasal Mucosa; Nasopharyngeal  Result Value Ref Range Status   MRSA by PCR POSITIVE (A) NEGATIVE Final    Comment:        The GeneXpert MRSA Assay (FDA approved for NASAL specimens only), is one component of a comprehensive MRSA colonization surveillance program. It is not intended to diagnose MRSA infection nor to guide or monitor treatment for MRSA infections. RESULT CALLED TO, READ BACK BY AND VERIFIED WITH: PARRISH,J RN 12/03/2018 AT 2339 SKEEN,P Performed at [facility], [place] [address] [place], [place] [address]   Culture, respiratory (non-expectorated)     Status: None    Collection Time: 12/04/18  9:04 AM   Specimen: Tracheal Aspirate; Respiratory  Result Value Ref Range Status   Specimen Description TRACHEAL ASPIRATE  Final   Special Requests NONE  Final   Gram Stain   Final    RARE WBC PRESENT, PREDOMINANTLY PMN RARE GRAM POSITIVE COCCI    Culture   Final    RARE Consistent with normal respiratory flora. Performed at [facility], [place] [address], [place], [place] [address]    Report Status 12/06/2018 FINAL  Final     Labs: Basic Metabolic Panel: Recent Labs  Lab 12/07/18 0604 12/08/18 4268 12/09/18 0603 12/09/18 1409 12/09/18 2236 12/10/18 0623 12/10/18 1358 12/11/18 0614  NA 128* 123* 117* 117* 117* 124* 123* 131*  K 3.8 4.5 4.6 4.8 4.5 4.8 4.8 4.6  CL 78* 76* 75* 75* 77* 83* 84* 90*  CO2 37* 34* 32 34* 28 31 31  34*  GLUCOSE 196* 165* 220* 216* 217* 143* 203* 120*  BUN 14 21* 24* 21* 19 19 19 18   CREATININE 0.52 0.56 0.70 0.62 0.62 0.76 0.60 0.52  CALCIUM  8.9 8.7* 8.3* 8.1* 7.9* 8.3* 8.0* 8.6*  MG 2.0 1.9 1.8  --   --  1.9  --  2.0   Liver Function Tests: No results for input(s): AST, ALT, ALKPHOS, BILITOT, PROT, ALBUMIN in the last 168 hours. No results for input(s): LIPASE, AMYLASE in the last 168 hours. No results for input(s): AMMONIA in the last 168 hours. CBC: Recent Labs  Lab 12/07/18 0604 12/08/18 0621 12/09/18 0603 12/10/18 0623 12/11/18 0614  WBC 7.7 9.6 8.4 7.0 7.3  HGB 13.6 14.2 14.1 14.4 14.8  HCT 41.4 42.4 40.9 42.8 44.4  MCV 92.8 90.8 89.3 90.1 92.1  PLT 177 189 205 201 205   Cardiac Enzymes: No results for input(s): CKTOTAL, CKMB, CKMBINDEX, TROPONINI in the last 168 hours. BNP: BNP (last 3 results) Recent Labs    06/17/18 2133 12/03/18 0957 12/06/18 0911  BNP 125.4* 296.6* 132.3*    ProBNP (last 3 results) No results for input(s): PROBNP in the last 8760 hours.  CBG: Recent Labs  Lab 12/12/18 1141 12/12/18 1604 12/12/18 2116 12/13/18 0730 12/13/18 1130  GLUCAP 139* 130* 169* 140*  175*    Active Problems:   Acute hypoxemic respiratory failure (HCC)   Time coordinating discharge: 38 minutes.  Signed:        Jayron Maqueda, DO [facility]  12/13/2018, 1:31 pm

## 2018-12-13 NOTE — Progress Notes (Signed)
Daily Nursing Note  Received report from [place], [place]. Assessed patient who was in good spirits. Gotten OOB to chair. Patient intermittently anxious throughout the day, most especially about leaving. Novel coronavirus test (-) not detected. Patient had good input/output today in the setting of sodium tabs though appears relatively hypertensive as well. Had pain in abdomen in afternoon. Incrementally states "I can't breath" though RR and SPO2 appear stable on supplemental 2LPM Pink. All patient needs met throughout the day.   Discharge: Plan for patient to transition to skilled nursing facility once her insurance authorization is complete.

## 2018-12-14 LAB — BASIC METABOLIC PANEL
Anion gap: 10 (ref 5–15)
Anion gap: 10 (ref 5–15)
BUN: 15 mg/dL (ref 6–20)
BUN: 19 mg/dL (ref 6–20)
CO2: 27 mmol/L (ref 22–32)
CO2: 25 mmol/L (ref 22–32)
Calcium: 8.2 mg/dL — ABNORMAL LOW (ref 8.9–10.3)
Calcium: 8.2 mg/dL — ABNORMAL LOW (ref 8.9–10.3)
Chloride: 89 mmol/L — ABNORMAL LOW (ref 98–111)
Chloride: 88 mmol/L — ABNORMAL LOW (ref 98–111)
Creatinine, Ser: 0.53 mg/dL (ref 0.44–1.00)
Creatinine, Ser: 0.47 mg/dL (ref 0.44–1.00)
GFR calc Af Amer: >60 mL/min (ref >60)
GFR calc Af Amer: >60 mL/min (ref >60)
GFR calc non Af Amer: >60 mL/min (ref >60)
GFR calc non Af Amer: >60 mL/min (ref >60)
Glucose, Bld: 161 mg/dL — ABNORMAL HIGH (ref 70–99)
Glucose, Bld: 159 mg/dL — ABNORMAL HIGH (ref 70–99)
Potassium: 4.7 mmol/L (ref 3.5–5.1)
Potassium: 4.5 mmol/L (ref 3.5–5.1)
Sodium: 126 mmol/L — ABNORMAL LOW (ref 135–145)
Sodium: 123 mmol/L — ABNORMAL LOW (ref 135–145)

## 2018-12-14 LAB — GLUCOSE, CAPILLARY
Glucose-Capillary: 119 mg/dL — ABNORMAL HIGH (ref 70–99)
Glucose-Capillary: 160 mg/dL — ABNORMAL HIGH (ref 70–99)
Glucose-Capillary: 167 mg/dL — ABNORMAL HIGH (ref 70–99)
Glucose-Capillary: 185 mg/dL — ABNORMAL HIGH (ref 70–99)

## 2018-12-14 MED ORDER — SODIUM CHLORIDE 1 G PO TABS
1.0000 g | ORAL_TABLET | Freq: Three times a day (TID) | ORAL | Status: DC
  Administered 2018-12-14 – 2018-12-18 (×12): 1 g via ORAL
  Filled 2018-12-14 (×15): qty 1

## 2018-12-14 MED ORDER — SODIUM CHLORIDE 0.9 % IV SOLN
INTRAVENOUS | Status: DC
  Administered 2018-12-14 – 2018-12-15 (×2): via INTRAVENOUS

## 2018-12-14 NOTE — Progress Notes (Signed)
COVID result was negative. The patient will require a new insurance authorization before they are able to discharge to [facility]. Juliann Pulse is aware and will start insurance authorization on Monday. The patient should be able to discharge to the facility once insurance authorization is received.   CSW will continue to assist with discharge.   Domenic Schwab, MSW, [place]

## 2018-12-14 NOTE — Discharge Summary (Signed)
Physician Discharge Summary  Natalie Thomas RSW:[id number] DOB: Jun 10, 1961 DOA: 12/03/2018  PCP: System, Pcp Not In  Admit date: 12/03/2018 Discharge date: 12/14/2018  Recommendations for Outpatient Follow-up:  1. Patient is to have PT/OT eval and treat at SNF. 2. Pt is to follow up with PCP in 7-10 days after discharge from SNF.  Contact information for after-discharge care    Destination    [facility] Preferred SNF .   Service: Skilled Nursing Contact information: [address] [place] [phone]               Discharge Diagnoses: Principal diagnosis is #1 1. Acute on chronic respiratory failure. 2. COPD exacerbation 3. Acute on chronic hyponatremia 4. Acute on chronic diastolic CHF. 5. UTI. 6. Depression/Anxiety/Schizophrenia  Discharge Condition: Fair Disposition: SNF  Diet recommendation: Heart healthy  Filed Weights   12/11/18 0318 12/13/18 0300 12/14/18 0500  Weight: 100.5 kg 100.6 kg 100.5 kg    History of present illness:  Natalie Thomas is a 58 y.o. female who has a PMH including but not limited to HTN, HLD, CHF, COPD, chronic hyponatremia, polycythemia, depression, schizoaffective disorder (see "past medical history" for rest).  She resides at [facility] and she presented to [facility] ED 6/10 with dyspnea and acute hypoxic respiratory failure (sats of 60% on RA).  She apparently had CXR at the facility which showed pulmonary edema.  In ED, she required intubation.  Per RN, she had moderate secretions during intubation.  She was initially hypertensive to 170's; however, SBP dropped to 90's with propofol.  HR also dropped down to as low as 40. She was asymptomatic.  COVID was obtained; however, it was the send out lab.  Rapid COVID was added after PCCM evaluated in ED.  No known fevers, chills, sweats, chest pain, exposures to known sick contacts / COVID positive pts.  Hospital Course:  The  patient was initially admitted to the ICU. Patient was extubated on 12/04/2018. [facility] assumed care on 12/06/2018. Sedating medications have been minimized due to the patient's decreased level of consciousness.   Sodium has been low chronically. It was 118 upon admission but did increase to 129 on 12/06/2018. This morning after diuresis was initiated on 12/07/2018, it has dropped back down to 117. NS has been started, and the patient's oral NaCl has been restarted. Today it is up to 131.  Repeat COVID-19 was returned negative on 12/11/2018. This was retested on 12/14/2018 so that results will be within a 38-72 hour timeframe from discharge.  Today's assessment: S: The patient is resting comfortably. No new complaints. O: Vitals:  Vitals:   12/14/18 1031 12/14/18 1122  BP: (!) 169/51 138/64  Pulse:  67  Resp:  (!) 173  Temp:  97.9 F (36.6 C)  SpO2:  91%    Constitutional:  . The patient is awake, alert, and oriented x 3. No acute distress. Respiratory:  . No increased work of breathing. . No wheezes, rales, or rhonchi. . No tactile fremitus. Cardiovascular:  . Regular rate and rhythm . No LE extremity edema   . Normal pedal pulses Abdomen:  . Abdomen is soft, non-tender, non-distended . No hernias, masses, or organomegaly . Normoactive bowel sounds. Musculoskeletal:  . No cyanosis, clubbing or edema Skin:  . No rashes, lesions, ulcers . palpation of skin: no induration or nodules Neurologic:  . CN 2-12 intact . Sensation all 4 extremities intact  Discharge Instructions  Discharge Instructions    (  HEART FAILURE PATIENTS) Call MD:  Anytime you have any of the following symptoms: 1) 3 pound weight gain in 24 hours or 5 pounds in 1 week 2) shortness of breath, with or without a dry hacking cough 3) swelling in the hands, feet or stomach 4) if you have to sleep on extra pillows at night in order to breathe.   Complete by: As directed    Activity as tolerated - No restrictions    Complete by: As directed    Activity as tolerated - No restrictions   Complete by: As directed    Call MD for:  difficulty breathing, headache or visual disturbances   Complete by: As directed    Call MD for:  difficulty breathing, headache or visual disturbances   Complete by: As directed    Call MD for:  persistant nausea and vomiting   Complete by: As directed    Call MD for:  persistant nausea and vomiting   Complete by: As directed    Call MD for:  severe uncontrolled pain   Complete by: As directed    Diet - low sodium heart healthy   Complete by: As directed    Discharge instructions   Complete by: As directed    PT/OT eval and treat. Follow up with PCP in 7-10 days after discharge.   Discharge instructions   Complete by: As directed    Follow up with PCP in 7-10 days. Daily weights.   Increase activity slowly   Complete by: As directed    Increase activity slowly   Complete by: As directed      Allergies as of 12/14/2018      Reactions   Clarithromycin Itching   Penicillins Itching   Has tolerated cefazolin before Has patient had a PCN reaction causing immediate rash, facial/tongue/throat swelling, SOB or lightheadedness with hypotension: No Has patient had a PCN reaction causing severe rash involving mucus membranes or skin necrosis: No Has patient had a PCN reaction that required hospitalization: Unknown Has patient had a PCN reaction occurring within the last 10 years: No If all of the above answers are "NO", then may proceed with Cephalosporin use.      Medication List    STOP taking these medications   albuterol 108 (90 Base) MCG/ACT inhaler Commonly known as: VENTOLIN HFA Replaced by: albuterol (2.5 MG/3ML) 0.083% nebulizer solution   dicyclomine 20 MG tablet Commonly known as: BENTYL   divalproex 250 MG DR tablet Commonly known as: DEPAKOTE   divalproex 500 MG DR tablet Commonly known as: DEPAKOTE   loperamide 2 MG tablet Commonly known as:  IMODIUM A-D   loratadine 10 MG tablet Commonly known as: CLARITIN   magnesium oxide 400 (241.3 Mg) MG tablet Commonly known as: MAG-OX   Melatonin 10 MG Tabs   neomycin-bacitracin-polymyxin ointment Commonly known as: NEOSPORIN   nystatin cream Commonly known as: MYCOSTATIN   oyster calcium 500 MG Tabs tablet   PARoxetine 20 MG tablet Commonly known as: PAXIL   PARoxetine 30 MG tablet Commonly known as: PAXIL   PROBIOTIC PO     TAKE these medications   acetaminophen 325 MG tablet Commonly known as: TYLENOL Take 650 mg by mouth 2 (two) times daily as needed (pain). Do not exceed 3 grams/24 hours of tylenol from all sources What changed: Another medication with the same name was removed. Continue taking this medication, and follow the directions you see here.   albuterol (2.5 MG/3ML) 0.083% nebulizer solution Commonly known  as: PROVENTIL Take 3 mLs (2.5 mg total) by nebulization every 4 (four) hours as needed for wheezing or shortness of breath. Replaces: albuterol 108 (90 Base) MCG/ACT inhaler   aluminum-magnesium hydroxide-simethicone 606-301-60 MG/5ML Susp Commonly known as: MAALOX Take 30 mLs by mouth 4 (four) times daily as needed (heartburn, indigestion).   arformoterol 15 MCG/2ML Nebu Commonly known as: BROVANA Take 2 mLs (15 mcg total) by nebulization 2 (two) times daily.   aspirin 81 MG tablet Take 1 tablet (81 mg total) by mouth daily. For heart health   benzonatate 100 MG capsule Commonly known as: TESSALON Take 1 capsule (100 mg total) by mouth 3 (three) times daily as needed for cough.   Breo Ellipta 100-25 MCG/INH Aepb Generic drug: fluticasone furoate-vilanterol Inhale 1 puff into the lungs daily.   budesonide 0.5 MG/2ML nebulizer solution Commonly known as: PULMICORT Take 2 mLs (0.5 mg total) by nebulization 2 (two) times daily.   buPROPion 150 MG 24 hr tablet Commonly known as: WELLBUTRIN XL Take 150 mg by mouth daily. What changed:  Another medication with the same name was removed. Continue taking this medication, and follow the directions you see here.   calcium carbonate 500 MG chewable tablet Commonly known as: TUMS - dosed in mg elemental calcium Chew 1 tablet (200 mg of elemental calcium total) by mouth 2 (two) times daily as needed for indigestion or heartburn.   diazepam 2 MG tablet Commonly known as: VALIUM Take 1 tablet (2 mg total) by mouth 2 (two) times a day. What changed:   medication strength  how much to take   docusate sodium 100 MG capsule Commonly known as: COLACE Take 100 mg by mouth 2 (two) times daily as needed (constipation).   ferrous sulfate 325 (65 FE) MG tablet Take 325 mg by mouth daily.   fluticasone 50 MCG/ACT nasal spray Commonly known as: FLONASE Place 2 sprays into both nostrils 2 (two) times daily.   furosemide 20 MG tablet Commonly known as: LASIX Take 20 mg by mouth 2 (two) times daily.   gabapentin 100 MG capsule Commonly known as: NEURONTIN Take 1 capsule (100 mg total) by mouth at bedtime. What changed:   medication strength  how much to take  when to take this   hydrALAZINE 100 MG tablet Commonly known as: APRESOLINE Take 100 mg by mouth 3 (three) times daily.   hydrOXYzine 25 MG tablet Commonly known as: ATARAX/VISTARIL Take 1 tablet (25 mg total) by mouth 3 (three) times daily as needed for anxiety.   ipratropium-albuterol 0.5-2.5 (3) MG/3ML Soln Commonly known as: DUONEB Take 3 mLs by nebulization See admin instructions. Inhale one vial via hand held nebulizer three times daily, may also use every 8 hours as needed for shortness of breath   lurasidone 40 MG Tabs tablet Commonly known as: LATUDA Take 40 mg by mouth 2 (two) times a day. What changed: Another medication with the same name was removed. Continue taking this medication, and follow the directions you see here.   metoprolol tartrate 25 MG tablet Commonly known as: LOPRESSOR Take 0.5  tablets (12.5 mg total) by mouth 2 (two) times daily.   nicotine 21 mg/24hr patch Commonly known as: NICODERM CQ - dosed in mg/24 hours Place 1 patch (21 mg total) onto the skin daily.   NIFEdipine 90 MG 24 hr tablet Commonly known as: PROCARDIA XL/NIFEDICAL-XL Take 90 mg by mouth daily.   omeprazole 20 MG capsule Commonly known as: PRILOSEC Take 1 capsule (20 mg  total) by mouth 2 (two) times daily before a meal. For acid reflux. What changed: when to take this   oxyCODONE 5 MG immediate release tablet Commonly known as: Oxy IR/ROXICODONE Take 1 tablet (5 mg total) by mouth daily.   polyethylene glycol 17 g packet Commonly known as: MiraLax Take 17 g by mouth daily. What changed:   when to take this  reasons to take this   pravastatin 20 MG tablet Commonly known as: PRAVACHOL Take 20 mg by mouth daily.   predniSONE 20 MG tablet Commonly known as: DELTASONE Take 2 tablets (40 mg total) by mouth daily for 3 days, THEN 1 tablet (20 mg total) daily for 3 days, THEN 0.5 tablets (10 mg total) daily for 3 days. Start taking on: December 11, 2018   QUEtiapine 50 MG tablet Commonly known as: SEROQUEL Take 1 tablet (50 mg total) by mouth at bedtime.   QUEtiapine 25 MG tablet Commonly known as: SEROQUEL Take 1 tablet (25 mg total) by mouth daily.   Robafen 100 MG/5ML syrup Generic drug: guaifenesin Take 200 mg by mouth 3 (three) times daily as needed for cough or congestion.   Saphris 5 MG Subl 24 hr tablet Generic drug: asenapine Place 1 tablet (5 mg total) under the tongue 2 (two) times daily.   sodium chloride 1 g tablet Take 1 tablet (1 g total) by mouth 3 (three) times daily with meals. What changed: when to take this   traZODone 50 MG tablet Commonly known as: DESYREL Take 1 tablet (50 mg total) by mouth at bedtime. What changed:   medication strength  how much to take   valproic acid 250 MG capsule Commonly known as: DEPAKENE Take 2 capsules (500 mg total)  by mouth 2 (two) times daily.      Allergies  Allergen Reactions  . Clarithromycin Itching  . Penicillins Itching    Has tolerated cefazolin before  Has patient had a PCN reaction causing immediate rash, facial/tongue/throat swelling, SOB or lightheadedness with hypotension: No Has patient had a PCN reaction causing severe rash involving mucus membranes or skin necrosis: No Has patient had a PCN reaction that required hospitalization: Unknown Has patient had a PCN reaction occurring within the last 10 years: No If all of the above answers are "NO", then may proceed with Cephalosporin use.     The results of significant diagnostics from this hospitalization (including imaging, microbiology, ancillary and laboratory) are listed below for reference.    Significant Diagnostic Studies: Dg Chest Port 1 View  Result Date: 12/05/2018 CLINICAL DATA:  Acute respiratory failure EXAM: PORTABLE CHEST 1 VIEW COMPARISON:  12/04/2018 FINDINGS: Cardiac shadow is enlarged but stable. Aortic calcifications are again seen. Bilateral pleural effusions with underlying atelectatic changes are again seen and stable. Endotracheal tube and gastric catheter have been removed in the interval. IMPRESSION: Stable effusions and atelectatic changes. No new focal abnormality is noted. Electronically Signed   By: Inez Catalina M.D.   On: 12/05/2018 07:36   Dg Chest Port 1 View  Result Date: 12/04/2018 CLINICAL DATA:  respiratory failurerespiratory failure EXAM: PORTABLE CHEST 1 VIEW COMPARISON:  12/03/2018 FINDINGS: Endotracheal tube appears in good position. NG tube extends into the stomach. Stable cardiomegaly. There is bibasilar atelectasis and bibasilar pleural effusions. Upper lungs are clear. IMPRESSION: 1. No significant change. 2. Stable support apparatus. 3. bilateral pleural effusions and bibasilar atelectasis Electronically Signed   By: Suzy Bouchard M.D.   On: 12/04/2018 08:42   Dg Chest Portable  1 View   Result Date: 12/03/2018 CLINICAL DATA:  Hypoxia EXAM: PORTABLE CHEST 1 VIEW COMPARISON:  Nov 08, 2018 FINDINGS: Endotracheal tube tip is 1.2 cm above the carina. Nasogastric tube tip and side port are below the diaphragm. No pneumothorax. There is pleural effusion with airspace consolidation in much of the right middle and lower lobes. There is consolidation in the medial left base. There is cardiomegaly with pulmonary vascularity normal. There is aortic atherosclerosis. No adenopathy. No bone lesions. IMPRESSION: Tube positions as described without pneumothorax. Note that the endotracheal tube tip is near the carina; it may be prudent to consider withdrawing endotracheal tube 2 to 2.5 cm. Right pleural effusion with consolidation throughout much of the right middle and lower lobes. There is also consolidation in the medial left base. There is stable cardiomegaly. Aortic Atherosclerosis (ICD10-I70.0). Electronically Signed   By: Lowella Grip III M.D.   On: 12/03/2018 15:47   Dg Chest Port 1 View  Result Date: 12/03/2018 CLINICAL DATA:  Oxygen desaturation. EXAM: PORTABLE CHEST 1 VIEW COMPARISON:  Single-view of the chest 11/08/2018. PA and lateral chest 07/04/2018 and 06/09/2018. CT chest 05/14/2018. FINDINGS: The lungs are emphysematous. There is marked cardiomegaly. Mild interstitial edema is present. Hazy opacities in the lung bases likely represent small effusions. No pneumothorax. Atherosclerosis noted. No acute bony abnormality. IMPRESSION: Cardiomegaly with mild interstitial edema and small pleural effusions. Atherosclerosis. Emphysema. Electronically Signed   By: Inge Rise M.D.   On: 12/03/2018 10:35    Microbiology: Recent Results (from the past 240 hour(s))  Novel Coronavirus, NAA (hospital order; send-out to ref lab)     Status: None   Collection Time: 12/11/18  4:02 PM   Specimen: Nasopharyngeal Swab; Respiratory  Result Value Ref Range Status   SARS-CoV-2, NAA NOT DETECTED NOT  DETECTED Final    Comment: (NOTE) This test was developed and its performance characteristics determined by [employer]. This test has not been FDA cleared or approved. This test has been authorized by FDA under an Emergency Use Authorization (EUA). This test is only authorized for the duration of time the declaration that circumstances exist justifying the authorization of the emergency use of in vitro diagnostic tests for detection of SARS-CoV-2 virus and/or diagnosis of COVID-19 infection under section 564(b)(1) of the Act, 21 U.S.C. 073XTG-6(Y)(6), unless the authorization is terminated or revoked sooner. When diagnostic testing is negative, the possibility of a false negative result should be considered in the context of a patient's recent exposures and the presence of clinical signs and symptoms consistent with COVID-19. An individual without symptoms of COVID-19 and who is not shedding SARS-CoV-2 virus would expect to have a negative (not detected) result in this assay. Performed  At: [facility] [address] [place], [place] [id number] Rush Farmer MD JJ:[id number]    [place]  Final    Comment: Performed at [facility], [place] [address], [place], [place] [address]     Labs: Basic Metabolic Panel: Recent Labs  Lab 12/08/18 [phone] 12/09/18 0603  12/09/18 2236 12/10/18 3810 12/10/18 1358 12/11/18 0614 12/14/18 0942  NA 123* 117*   < > 117* 124* 123* 131* 126*  K 4.5 4.6   < > 4.5 4.8 4.8 4.6 4.7  CL 76* 75*   < > 77* 83* 84* 90* 89*  CO2 34* 32   < > 28 31 31  34* 27  GLUCOSE 165* 220*   < > 217* 143* 203* 120* 161*  BUN 21* 24*   < >  19 19 19 18 15   CREATININE 0.56 0.70   < > 0.62 0.76 0.60 0.52 0.53  CALCIUM 8.7* 8.3*   < > 7.9* 8.3* 8.0* 8.6* 8.2*  MG 1.9 1.8  --   --  1.9  --  2.0  --    < > = values in this interval not displayed.   Liver Function Tests: No results for input(s): AST, ALT, ALKPHOS, BILITOT,  PROT, ALBUMIN in the last 168 hours. No results for input(s): LIPASE, AMYLASE in the last 168 hours. No results for input(s): AMMONIA in the last 168 hours. CBC: Recent Labs  Lab 12/08/18 0621 12/09/18 0603 12/10/18 0623 12/11/18 0614  WBC 9.6 8.4 7.0 7.3  HGB 14.2 14.1 14.4 14.8  HCT 42.4 40.9 42.8 44.4  MCV 90.8 89.3 90.1 92.1  PLT 189 205 201 205   Cardiac Enzymes: No results for input(s): CKTOTAL, CKMB, CKMBINDEX, TROPONINI in the last 168 hours. BNP: BNP (last 3 results) Recent Labs    06/17/18 2133 12/03/18 0957 12/06/18 0911  BNP 125.4* 296.6* 132.3*    ProBNP (last 3 results) No results for input(s): PROBNP in the last 8760 hours.  CBG: Recent Labs  Lab 12/13/18 1130 12/13/18 1553 12/13/18 2127 12/14/18 0836 12/14/18 1121  GLUCAP 175* 197* 189* 119* 160*    Active Problems:   Acute hypoxemic respiratory failure (HCC)   Time coordinating discharge: 38 minutes.  Signed:        Terique Kawabata, DO [facility]  12/14/2018, 2:10 pm

## 2018-12-14 NOTE — Progress Notes (Signed)
PROGRESS NOTE  Natalie Thomas WJX:[id number] DOB: 1961/05/06 DOA: 12/03/2018 PCP: System, Pcp Not In  Brief History   58 year old female with history of COPD on 2 L, diastolic CHF, hypertension, hyperlipidemia, chronic hyponatremia, polycythemia, depression and schizoaffective disorder admitted with acute on chronic respiratory failure with desaturation to 60% on room air.  CXR showed bibasilar consolidation thought to be due to H CAP.  ABG with chronic respiratory acidosis.  Intubated on 6/10 and admitted to ICU.  Started on broad-spectrum antibiotic.  COVID-19 and blood cultures negative.  Urine showed grew E. coli.  Patient was extubated on 12/04/2018.  TRH assumed care on 12/06/2018. Sedating medications have been minimized due to the patient's decreased level of consciousness.   Sodium has been low chronically. It was 118 upon admission but did increase to 129 on 12/06/2018. This morning after diuresis was initiated on 12/07/2018, it has dropped back down to 117. NS has been started, and the patient's oral NaCl has been restarted. It was up to 131 on 12/11/2018. Sodium today is again down to 126. NS has been restarted.  Consultants  . None  Procedures  . None  Antibiotics   Anti-infectives (From admission, onward)   Start     Dose/Rate Route Frequency Ordered Stop   12/05/18 1000  cefTRIAXone (ROCEPHIN) 2 g in sodium chloride 0.9 % 100 mL IVPB     2 g 200 mL/hr over 30 Minutes Intravenous Every 24 hours 12/05/18 0814 12/07/18 1015   12/04/18 1800  vancomycin (VANCOCIN) 1,250 mg in sodium chloride 0.9 % 250 mL IVPB  Status:  Discontinued     1,250 mg 166.7 mL/hr over 90 Minutes Intravenous Every 24 hours 12/03/18 1744 12/05/18 1131   12/03/18 1715  vancomycin (VANCOCIN) 1,500 mg in sodium chloride 0.9 % 500 mL IVPB     1,500 mg 250 mL/hr over 120 Minutes Intravenous  Once 12/03/18 1709 12/03/18 2037   12/03/18 1715  ceFEPIme (MAXIPIME) 2 g in sodium chloride 0.9 % 100 mL IVPB   Status:  Discontinued     2 g 200 mL/hr over 30 Minutes Intravenous Every 8 hours 12/03/18 1709 12/05/18 0814     .   Subjective  The patient is resting quietly. No new complaints.   Objective   Vitals:  Vitals:   12/14/18 1031 12/14/18 1122  BP: (!) 169/51 138/64  Pulse:  67  Resp:  (!) 173  Temp:  97.9 F (36.6 C)  SpO2:  91%    Exam:  Constitutional:  . The patient is awake, alert, and oriented x 3. No acute distressed. Respiratory:  . No increased work of breathing. . No wheezes, rales, or rhonchi. . No tactile fremitus. Cardiovascular:  . Regular rate and rhythm . No murmurs, ectopy, or gallups. . No lateral PMI. No thrills. Abdomen:  . Abdomen is soft, non-tender, non-distended. . No organomegaly, masses, or hernias are appreciated. . Normoactive bowel sounds. Musculoskeletal:  . No cyanosis, clubbing, or edema Skin:  . No rashes, lesions, ulcers . palpation of skin: no induration or nodules Neurologic:  . CN 2-12 intact . Sensation all 4 extremities intact Psychiatric:  . Mental status o Mood, affect: Anxious/aggressive. o Orientation to person, place, time  . Judgment and insight do not appear intact   I have personally reviewed the following:   Today's Data  . BMP, CBC, Vitals  Micro Data  . Respiratory culture: Positive for normal respiratory culture . Urine culture positive for E. Coli . Blood cultures  have had no growth   Scheduled Meds: . arformoterol  15 mcg Nebulization BID  . asenapine  5 mg Sublingual BID  . aspirin  81 mg Oral Daily  . budesonide (PULMICORT) nebulizer solution  0.5 mg Nebulization BID  . buPROPion  150 mg Oral Daily  . diazepam  2 mg Oral BID  . gabapentin  100 mg Oral QHS  . guaiFENesin  600 mg Oral BID  . heparin  5,000 Units Subcutaneous Q8H  . hydrALAZINE  100 mg Oral Q8H  . insulin aspart  0-5 Units Subcutaneous QHS  . insulin aspart  0-9 Units Subcutaneous TID WC  . ipratropium-albuterol  3 mL  Nebulization BID  . lurasidone  40 mg Oral BID  . mouth rinse  15 mL Mouth Rinse BID  . methylPREDNISolone (SOLU-MEDROL) injection  60 mg Intravenous Q12H  . metoprolol tartrate  12.5 mg Oral BID  . nicotine  21 mg Transdermal Daily  . NIFEdipine  90 mg Oral Daily  . pantoprazole  40 mg Oral QHS  . pravastatin  20 mg Per Tube q1800  . QUEtiapine  25 mg Oral Daily  . QUEtiapine  50 mg Oral QHS  . traZODone  50 mg Oral QHS  . valproic acid  500 mg Oral BID   Continuous Infusions: . sodium chloride 250 mL (12/07/18 0943)  . sodium chloride      Active Problems:   Acute hypoxemic respiratory failure (HCC)   LOS: 11 days    A & P   Acute on chronic hypoxemic and hypercarbic respiratory failure due to COPD exacerbation and possible CHF exacerbation. Repeat ABG on 6/13 reassuring.  Blood and sputum cultures negative.  COVID-19 negative.  This is improving.  She is now saturating in mid 90s on 4 L by nasal cannula. Procalcitonin is negligible which makes bacterial pneumonia unlikely. COVID-19 and blood cultures negative. Extubated 12/04/2018. The patient received Vancomycin and cefepime initially in the ICU. That was de-escalated to Rocephin on 6/12. She is also receiving steroids which are being tapered down. Beta blockers were changed from Coreg to metoprolol for beta 1 selectivity. She has been continued on Brovana, budesonide and DuoNeb. Diazepam was decreased from 2 mg bid due to somnolence.  Acute on chronic hyponatremia: Sodium has been low chronically. It was 118 upon admission but did increase to 129 on 12/06/2018. This morning after diuresis was initiated on 12/07/2018, it has dropped back down to 117. NS has been started, and the patient's oral NaCl has been restarted. Will recheck sodium frequently. She is appropriate for discharge to SNF, but will need second CIVID-19 before she can go to SNF. Today sodium is again down to 126 likely due to a reset osmostat and potomania according to  her family. I will restart NS and monitor sodium.  Acute metabolic encephalopathy: Resolved. Multifactorial including acute respiratory failure, UTI and medications. Thyroid panel consistent with euthyroid syndrome.  She is somnolent this morning. Reduce Valium to 2 mg twice daily on 6/13. Nightly gabapentin reduced to 100 mg. Trazadone reduced to 50 mg. Mucinex DM changed to guaifenesin only. Oxycodone has been reduced to 5 mg from q8 to q12 prn pain. Correct sodium appropriately. Frequent reorientation.  Aggression and agitation: appears improved. Seroquel added, but did not improve her behavior. IV Haldol has been added. The patient is receiving Depakote as well which should help with moderation of her behavior.  Acute on chronic diastolic CHF: Echo on 2/70 with EF greater than 65% and  no significant structural abnormalities.  Patient was hypertensive on arrival.  CXR with bibasilar opacities.  BNP marginally elevated.  UOP -3.2L/24 hours.  Renal function is stable.  Hyponatremia worse.  Respiratory status improved. Lasix held due to low sodium. Monitor volume status, electrolytes, renal status,  and respiratory status. Restart lasix at a lower dose for discharge.  E. coli UTI: She denies dysuria today.  No fever or leukocytosis. She has completed antibiotic course with ceftriaxone.  Depression/anxiety/schizoaffective disorder: Continue home Depakote, levels were low. Continue home carcinoma pain, Latuda and Paxil. Reduced Valium, gabapentin and trazodone.  Chronic pain/chronic opiate use: Scheduled Tylenol 650 mg every 8 hours. Gabapentin and oxycodone reduced as above in the setting of somnolence.  Essential hypertension: Vented.  On Procardia, labetalol, and hydralazine at home. Metoprolol 12.5 mg twice daily-beta-1 selective. Resumed home Procardia  GERD/constipation: PPI. Scheduled MiraLAX x3 until she has bowel movement.  Inadequate oral intake related to acute illness: Appreciate  nutrition input-Magic cup and supplements.  I have seen and examined this patient myself. I have spent 30 minutes in her evaluation and care.   DVT prophylaxis: Heparin Code Status: Full Code Family Communication: No family at bedside.. Disposition Plan: SNF   Azaria Bartell, DO [facility] Direct contact: see [email or web address]  7PM-7AM contact night coverage as above  12/14/2018, 2:19 PM  LOS: 1 days

## 2018-12-14 NOTE — Progress Notes (Addendum)
PROGRESS NOTE  Natalie Thomas ZPH:[id number] DOB: 03-13-1961 DOA: 12/03/2018 PCP: System, Pcp Not In  Brief History   58 year old female with history of COPD on 2 L, diastolic CHF, hypertension, hyperlipidemia, chronic hyponatremia, polycythemia, depression and schizoaffective disorder admitted with acute on chronic respiratory failure with desaturation to 60% on room air.  CXR showed bibasilar consolidation thought to be due to H CAP.  ABG with chronic respiratory acidosis.  Intubated on 6/10 and admitted to ICU.  Started on broad-spectrum antibiotic.  COVID-19 and blood cultures negative.  Urine showed grew E. coli.  Patient was extubated on 12/04/2018.  TRH assumed care on 12/06/2018. Sedating medications have been minimized due to the patient's decreased level of consciousness.   Sodium has been low chronically. It was 118 upon admission but did increase to 129 on 12/06/2018. This morning after diuresis was initiated on 12/07/2018, it has dropped back down to 117. NS has been started, and the patient's oral NaCl has been restarted. It is up to 131 on 12/11/2018.  Consultants  . None  Procedures  . None  Antibiotics   Anti-infectives (From admission, onward)   Start     Dose/Rate Route Frequency Ordered Stop   12/05/18 1000  cefTRIAXone (ROCEPHIN) 2 g in sodium chloride 0.9 % 100 mL IVPB     2 g 200 mL/hr over 30 Minutes Intravenous Every 24 hours 12/05/18 0814 12/07/18 1015   12/04/18 1800  vancomycin (VANCOCIN) 1,250 mg in sodium chloride 0.9 % 250 mL IVPB  Status:  Discontinued     1,250 mg 166.7 mL/hr over 90 Minutes Intravenous Every 24 hours 12/03/18 1744 12/05/18 1131   12/03/18 1715  vancomycin (VANCOCIN) 1,500 mg in sodium chloride 0.9 % 500 mL IVPB     1,500 mg 250 mL/hr over 120 Minutes Intravenous  Once 12/03/18 1709 12/03/18 2037   12/03/18 1715  ceFEPIme (MAXIPIME) 2 g in sodium chloride 0.9 % 100 mL IVPB  Status:  Discontinued     2 g 200 mL/hr over 30 Minutes  Intravenous Every 8 hours 12/03/18 1709 12/05/18 0814     .   Subjective  The patient is resting quietly. No new complaints.   Objective   Vitals:  Vitals:   12/14/18 1031 12/14/18 1122  BP: (!) 169/51 138/64  Pulse:  67  Resp:  (!) 173  Temp:  97.9 F (36.6 C)  SpO2:  91%    Exam:  Constitutional:  . The patient is awake, alert, and oriented x 3. No acute distressed. Respiratory:  . No increased work of breathing. . No wheezes, rales, or rhonchi. . No tactile fremitus. Cardiovascular:  . Regular rate and rhythm . No murmurs, ectopy, or gallups. . No lateral PMI. No thrills. Abdomen:  . Abdomen is soft, non-tender, non-distended. . No organomegaly, masses, or hernias are appreciated. . Normoactive bowel sounds. Musculoskeletal:  . No cyanosis, clubbing, or edema Skin:  . No rashes, lesions, ulcers . palpation of skin: no induration or nodules Neurologic:  . CN 2-12 intact . Sensation all 4 extremities intact Psychiatric:  . Mental status o Mood, affect: Anxious/aggressive. o Orientation to person, place, time  . Judgment and insight do not appear intact   I have personally reviewed the following:   Today's Data  . BMP, CBC, Vitals  Micro Data  . Respiratory culture: Positive for normal respiratory culture . Urine culture positive for E. Coli . Blood cultures have had no growth   Scheduled Meds: . arformoterol  15 mcg Nebulization BID  . asenapine  5 mg Sublingual BID  . aspirin  81 mg Oral Daily  . budesonide (PULMICORT) nebulizer solution  0.5 mg Nebulization BID  . buPROPion  150 mg Oral Daily  . diazepam  2 mg Oral BID  . gabapentin  100 mg Oral QHS  . guaiFENesin  600 mg Oral BID  . heparin  5,000 Units Subcutaneous Q8H  . hydrALAZINE  100 mg Oral Q8H  . insulin aspart  0-5 Units Subcutaneous QHS  . insulin aspart  0-9 Units Subcutaneous TID WC  . ipratropium-albuterol  3 mL Nebulization BID  . lurasidone  40 mg Oral BID  . mouth rinse   15 mL Mouth Rinse BID  . methylPREDNISolone (SOLU-MEDROL) injection  60 mg Intravenous Q12H  . metoprolol tartrate  12.5 mg Oral BID  . nicotine  21 mg Transdermal Daily  . NIFEdipine  90 mg Oral Daily  . pantoprazole  40 mg Oral QHS  . pravastatin  20 mg Per Tube q1800  . QUEtiapine  25 mg Oral Daily  . QUEtiapine  50 mg Oral QHS  . traZODone  50 mg Oral QHS  . valproic acid  500 mg Oral BID   Continuous Infusions: . sodium chloride 250 mL (12/07/18 0943)  . sodium chloride      Active Problems:   Acute hypoxemic respiratory failure (HCC)   LOS: 11 days    A & P   Acute on chronic hypoxemic and hypercarbic respiratory failure due to COPD exacerbation and possible CHF exacerbation. Repeat ABG on 6/13 reassuring.  Blood and sputum cultures negative.  COVID-19 negative.  This is improving.  She is now saturating in mid 90s on 4 L by nasal cannula. Procalcitonin is negligible which makes bacterial pneumonia unlikely. COVID-19 and blood cultures negative. Extubated 12/04/2018. The patient received Vancomycin and cefepime initially in the ICU. That was de-escalated to Rocephin on 6/12. She is also receiving steroids which are being tapered down. Beta blockers were changed from Coreg to metoprolol for beta 1 selectivity. She has been continued on Brovana, budesonide and DuoNeb. Diazepam was decreased from 2 mg bid due to somnolence.  Acute on chronic hyponatremia: Sodium has been low chronically. It was 118 upon admission but did increase to 129 on 12/06/2018. This morning after diuresis was initiated on 12/07/2018, it has dropped back down to 117. NS has been started, and the patient's oral NaCl has been restarted. Will recheck sodium frequently. She is appropriate for discharge to SNF, but will need second CIVID-19 before she can go to SNF.  Acute metabolic encephalopathy: Resolved. Multifactorial including acute respiratory failure, UTI and medications. Thyroid panel consistent with euthyroid  syndrome.  She is somnolent this morning. Reduce Valium to 2 mg twice daily on 6/13. Nightly gabapentin reduced to 100 mg. Trazadone reduced to 50 mg. Mucinex DM changed to guaifenesin only. Oxycodone has been reduced to 5 mg from q8 to q12 prn pain. Correct sodium appropriately. Frequent reorientation.  Aggression and agitation: appears improved. Seroquel added, but did not improve her behavior. IV Haldol has been added. The patient is receiving Depakote as well which should help with moderation of her behavior.  Acute on chronic diastolic CHF: Echo on 3/32 with EF greater than 65% and no significant structural abnormalities.  Patient was hypertensive on arrival.  CXR with bibasilar opacities.  BNP marginally elevated.  UOP -3.2L/24 hours.  Renal function is stable.  Hyponatremia worse.  Respiratory status improved. Lasix held  due to low sodium. Monitor volume status, electrolytes, renal status,  and respiratory status. Restart lasix at a lower dose for discharge.  E. coli UTI: She denies dysuria today.  No fever or leukocytosis. She has completed antibiotic course with ceftriaxone.  Depression/anxiety/schizoaffective disorder: Continue home Depakote, levels were low. Continue home carcinoma pain, Latuda and Paxil. Reduced Valium, gabapentin and trazodone.  Chronic pain/chronic opiate use: Scheduled Tylenol 650 mg every 8 hours. Gabapentin and oxycodone reduced as above in the setting of somnolence.  Essential hypertension: Vented.  On Procardia, labetalol, and hydralazine at home. Metoprolol 12.5 mg twice daily-beta-1 selective. Resumed home Procardia  GERD/constipation: PPI. Scheduled MiraLAX x3 until she has bowel movement.  Inadequate oral intake related to acute illness: Appreciate nutrition input-Magic cup and supplements.  I have seen and examined this patient myself. I have spent 32 minutes in her evaluation and care.   DVT prophylaxis: Heparin Code Status: Full Code Family  Communication: I have discussed the patient with her Gertie Exon as the patient had requested. Disposition Plan: SNF   Shelsea Hangartner, DO [facility] Direct contact: see [email or web address]  7PM-7AM contact night coverage as above  12/13/2018, 1:27 PM  LOS: 1 days

## 2018-12-15 LAB — BASIC METABOLIC PANEL
Anion gap: 9 (ref 5–15)
BUN: 16 mg/dL (ref 6–20)
CO2: 29 mmol/L (ref 22–32)
Calcium: 8.5 mg/dL — ABNORMAL LOW (ref 8.9–10.3)
Chloride: 92 mmol/L — ABNORMAL LOW (ref 98–111)
Creatinine, Ser: 0.49 mg/dL (ref 0.44–1.00)
GFR calc Af Amer: >60 mL/min (ref >60)
GFR calc non Af Amer: >60 mL/min (ref >60)
Glucose, Bld: 125 mg/dL — ABNORMAL HIGH (ref 70–99)
Potassium: 4.5 mmol/L (ref 3.5–5.1)
Sodium: 130 mmol/L — ABNORMAL LOW (ref 135–145)

## 2018-12-15 LAB — GLUCOSE, CAPILLARY
Glucose-Capillary: 126 mg/dL — ABNORMAL HIGH (ref 70–99)
Glucose-Capillary: 161 mg/dL — ABNORMAL HIGH (ref 70–99)
Glucose-Capillary: 112 mg/dL — ABNORMAL HIGH (ref 70–99)
Glucose-Capillary: 195 mg/dL — ABNORMAL HIGH (ref 70–99)

## 2018-12-15 MED ORDER — ALBUTEROL SULFATE (2.5 MG/3ML) 0.083% IN NEBU: 2.5000 mg | INHALATION_SOLUTION | Freq: Four times a day (QID) | RESPIRATORY_TRACT | Status: DC | PRN

## 2018-12-15 MED ORDER — METOPROLOL TARTRATE 25 MG PO TABS
25.0000 mg | ORAL_TABLET | Freq: Two times a day (BID) | ORAL | Status: DC
  Administered 2018-12-15 – 2018-12-18 (×6): 25 mg via ORAL
  Filled 2018-12-15 (×6): qty 1

## 2018-12-15 MED ORDER — HYDRALAZINE HCL 20 MG/ML IJ SOLN
5.0000 mg | INTRAMUSCULAR | Status: DC | PRN
  Administered 2018-12-15: 5 mg via INTRAVENOUS
  Filled 2018-12-15: qty 1

## 2018-12-15 NOTE — Progress Notes (Signed)
Physical Therapy Treatment Patient Details Name: Natalie Thomas MRN: [id number] DOB: [birth year] Today's Date: 12/15/2018    History of Present Illness Pt adm 12/03/18 with hypoxic respiratory failure due to copd exacerbation and PNA. PMH - rt hip fx, copd, schizoaffective disorder, htn, dm, chf, lumbar disc disease, pelvic fx    PT Comments    Pt was unaware that her d/c plan is for SNF rehab until I told her she may d/c today and has d/c summary in her chart.  She was able to walk a short distance on less O2 (2L this session compared to 4L last session) maintaining sats 90 or higher during mobility.  She has limited gait distance and endurance due to generalized weakness.  She remains appropriate for SNF level rehab at discharge.  PT will continue to follow acutely for safe mobility progression  Follow Up Recommendations  SNF     Equipment Recommendations  None recommended by PT    Recommendations for Other Services    NA    Precautions / Restrictions Precautions Precautions: Fall;Other (comment) Precaution Comments: monitor O2 sats    Mobility  Bed Mobility Overal bed mobility: Needs Assistance Bed Mobility: Supine to Sit     Supine to sit: Supervision;HOB elevated     General bed mobility comments: Supervision for safety, extra time and effort needed.  Pt asked to sit EOB for a few minutes to recover before walking.   Transfers Overall transfer level: Needs assistance Equipment used: Rolling walker (2 wheeled) Transfers: Sit to/from Stand Sit to Stand: Min guard         General transfer comment: Min guard assist for safety, multiple attempts needed to get to standing especially from lower recliner chair.  Heavy reliance on hands for transitions.   Ambulation/Gait Ambulation/Gait assistance: Supervision Gait Distance (Feet): 25 Feet(25'x1, 15'x1) Assistive device: Rolling walker (2 wheeled) Gait Pattern/deviations: Step-through pattern;Shuffle;Trunk  flexed Gait velocity: decr   General Gait Details: slow, shuffling gait patterns, self reported weakness in bil LEs, DOE 2/4 with lowest O2 sat 90% on 2 L O2 [place].           Balance Overall balance assessment: Needs assistance Sitting-balance support: Feet supported;Bilateral upper extremity supported Sitting balance-Leahy Scale: Fair     Standing balance support: Bilateral upper extremity supported Standing balance-Leahy Scale: Poor Standing balance comment: needs UE support in standing.                             Cognition Arousal/Alertness: Awake/alert Behavior During Therapy: WFL for tasks assessed/performed Overall Cognitive Status: No family/caregiver present to determine baseline cognitive functioning                                 General Comments: Pt seems to have some STM deficits as she could not tell me how much O2 she is on normally.             Pertinent Vitals/Pain Pain Assessment: Faces Faces Pain Scale: Hurts little more Pain Location: left knee Pain Descriptors / Indicators: Guarding;Grimacing Pain Intervention(s): Limited activity within patient's tolerance;Monitored during session;Repositioned           PT Goals (current goals can now be found in the care plan section) Acute Rehab PT Goals Patient Stated Goal: to not have to go to rehab Progress towards PT goals: Progressing toward goals    Frequency  Min 2X/week      PT Plan Current plan remains appropriate       AM-PAC PT "6 Clicks" Mobility   Outcome Measure  Help needed turning from your back to your side while in a flat bed without using bedrails?: A Little Help needed moving from lying on your back to sitting on the side of a flat bed without using bedrails?: A Little Help needed moving to and from a bed to a chair (including a wheelchair)?: A Little Help needed standing up from a chair using your arms (e.g., wheelchair or bedside chair)?: A  Little Help needed to walk in hospital room?: None Help needed climbing 3-5 steps with a railing? : A Little 6 Click Score: 19    End of Session Equipment Utilized During Treatment: Gait belt;Oxygen(2 L O2 [place]) Activity Tolerance: Patient limited by fatigue Patient left: in chair;with call bell/phone within reach;with chair alarm set;with nursing/sitter in room Nurse Communication: Mobility status PT Visit Diagnosis: Unsteadiness on feet (R26.81);Muscle weakness (generalized) (M62.81);Other abnormalities of gait and mobility (R26.89)     Time: 1100-1144 PT Time Calculation (min) (ACUTE ONLY): 44 min  Charges:  $Gait Training: 23-37 mins $Therapeutic Activity: 8-22 mins                    Jalayne Ganesh B. Carolos Fecher, PT, DPT  Acute Rehabilitation [phone] pager [phone] office  @ [facility]: [phone]   12/15/2018, 11:45 AM

## 2018-12-15 NOTE — Progress Notes (Signed)
PROGRESS NOTE    Natalie Thomas  VOH:[id number] DOB: January 16, 1961 DOA: 12/03/2018 PCP: System, Pcp Not In    Brief Narrative:  58 year old female with history of COPD on 2 L, diastolic CHF, hypertension, hyperlipidemia, chronic hyponatremia, polycythemia, depression and schizoaffective disorder admitted with acute on chronic respiratory failure with desaturation to 60% on room air. CXR showed bibasilar consolidation thought to be due to H CAP. ABG with chronic respiratory acidosis. Intubated on 6/10 and admitted to ICU. Started on broad-spectrum antibiotic. COVID-19 and blood cultures negative. Urine showed grew E. coli.  Patient was extubated on 12/04/2018. TRH assumed care on 12/06/2018. Sedating medications have been minimized due to the patient's decreased level of consciousness.   Assessment & Plan:   Active Problems:   Acute hypoxemic respiratory failure (HCC)  Acute on chronic hypoxemic and hypercarbic respiratory failure due to COPD exacerbation and possible CHF exacerbation.  -Blood and sputum cultures negative.  -COVID-19 negative.  -Currently on minimal O2 -Completed course of abx -Stable at present  Acute on chronic hyponatremia:  -Suspecting SIADH -improved overnight with fluid restricted diet, although pt is also on IVF -Will hold further IVF and repeat BNP in AM  Acute metabolic encephalopathy:  -Resolved.  -suspect secondary to hypercarbic failure and hyponatremia per above  Aggression and agitation:  -appears improved. Seroquel was added -Currently on depakote  Acute on chronic diastolic CHF:  -Echo on 6/94 with EF greater than 65% and no significant structural abnormalities.  -Sodium worsened with lasix, since stopped -Repeat BMET in AM -Pt voiding well, over 5L overnight  E. coli UTI:  -completed antibiotic course with ceftriaxone.  Depression/anxiety/schizoaffective disorder:  -Continue home Depakote, - Continue home carcinoma pain,  Latuda and Paxil. Reduced Valium, gabapentin and trazodone.   Chronic pain/chronic opiate use:  -Continue with scheduled Tylenol 650 mg every 8 hours. Gabapentin and oxycodone reduced as above in the setting of somnolence.  Essential hypertension:  -on Procardia, labetalol, and hydralazine at home. Metoprolol 12.5 mg twice daily-beta-1 selective.  -BP currently suboptimally controlled. Increase metoprolol to 25mg  BID  GERD/constipation: PPI. Scheduled MiraLAX x3 until she has bowel movement.  Inadequate oral intake related to acute illness: Appreciate nutrition input-Magic cup and supplements.  DVT prophylaxis: Heparin subQ Code Status: Full Family Communication: Pt in room, family not at bedside Disposition Plan: Uncertain at this time  Consultants:     Procedures:     Antimicrobials: Anti-infectives (From admission, onward)   Start     Dose/Rate Route Frequency Ordered Stop   12/05/18 1000  cefTRIAXone (ROCEPHIN) 2 g in sodium chloride 0.9 % 100 mL IVPB     2 g 200 mL/hr over 30 Minutes Intravenous Every 24 hours 12/05/18 0814 12/07/18 1015   12/04/18 1800  vancomycin (VANCOCIN) 1,250 mg in sodium chloride 0.9 % 250 mL IVPB  Status:  Discontinued     1,250 mg 166.7 mL/hr over 90 Minutes Intravenous Every 24 hours 12/03/18 1744 12/05/18 1131   12/03/18 1715  vancomycin (VANCOCIN) 1,500 mg in sodium chloride 0.9 % 500 mL IVPB     1,500 mg 250 mL/hr over 120 Minutes Intravenous  Once 12/03/18 1709 12/03/18 2037   12/03/18 1715  ceFEPIme (MAXIPIME) 2 g in sodium chloride 0.9 % 100 mL IVPB  Status:  Discontinued     2 g 200 mL/hr over 30 Minutes Intravenous Every 8 hours 12/03/18 1709 12/05/18 0814       Subjective: Without complaints at this time. Eager to go home  Objective: Vitals:  12/15/18 0754 12/15/18 1022 12/15/18 1446 12/15/18 1628  BP: (!) 195/71 (!) 175/54 (!) 166/57 (!) 175/57  Pulse: (!) 58 71  62  Resp: 12   20  Temp: (!) 97.3 F (36.3 C)   98.3  F (36.8 C)  TempSrc: Oral   Oral  SpO2: 94%   96%  Weight:      Height:        Intake/Output Summary (Last 24 hours) at 12/15/2018 1844 Last data filed at 12/15/2018 1100 Gross per 24 hour  Intake 1746 ml  Output 5250 ml  Net -3504 ml   Filed Weights   12/13/18 0300 12/14/18 0500 12/15/18 0300  Weight: 100.6 kg 100.5 kg 100.7 kg    Examination:  General exam: Appears calm and comfortable  Respiratory system: Clear to auscultation. Respiratory effort normal. Cardiovascular system: S1 & S2 heard, RRR Gastrointestinal system: Abdomen is nondistended, soft and nontender. No organomegaly or masses felt. Normal bowel sounds heard. Central nervous system: Alert . No focal neurological deficits. Extremities: Symmetric 5 x 5 power. Skin: No rashes, lesions  Psychiatry: mood & affect appropriate, no visual hallucinations  Data Reviewed: I have personally reviewed following labs and imaging studies  CBC: Recent Labs  Lab 12/09/18 0603 12/10/18 0623 12/11/18 0614  WBC 8.4 7.0 7.3  HGB 14.1 14.4 14.8  HCT 40.9 42.8 44.4  MCV 89.3 90.1 92.1  PLT 205 201 683   Basic Metabolic Panel: Recent Labs  Lab 12/09/18 0603  12/10/18 0623 12/10/18 1358 12/11/18 0614 12/14/18 0942 12/14/18 1706 12/15/18 0843  NA 117*   < > 124* 123* 131* 126* 123* 130*  K 4.6   < > 4.8 4.8 4.6 4.7 4.5 4.5  CL 75*   < > 83* 84* 90* 89* 88* 92*  CO2 32   < > 31 31 34* 27 25 29   GLUCOSE 220*   < > 143* 203* 120* 161* 159* 125*  BUN 24*   < > 19 19 18 15 19 16   CREATININE 0.70   < > 0.76 0.60 0.52 0.53 0.47 0.49  CALCIUM 8.3*   < > 8.3* 8.0* 8.6* 8.2* 8.2* 8.5*  MG 1.8  --  1.9  --  2.0  --   --   --    < > = values in this interval not displayed.   GFR: Estimated Creatinine Clearance: 87.8 mL/min (by C-G formula based on SCr of 0.49 mg/dL). Liver Function Tests: No results for input(s): AST, ALT, ALKPHOS, BILITOT, PROT, ALBUMIN in the last 168 hours. No results for input(s): LIPASE, AMYLASE in  the last 168 hours. No results for input(s): AMMONIA in the last 168 hours. Coagulation Profile: No results for input(s): INR, PROTIME in the last 168 hours. Cardiac Enzymes: No results for input(s): CKTOTAL, CKMB, CKMBINDEX, TROPONINI in the last 168 hours. BNP (last 3 results) No results for input(s): PROBNP in the last 8760 hours. HbA1C: No results for input(s): HGBA1C in the last 72 hours. CBG: Recent Labs  Lab 12/14/18 1622 12/14/18 2144 12/15/18 0753 12/15/18 1133 12/15/18 1630  GLUCAP 167* 185* 126* 161* 112*   Lipid Profile: No results for input(s): CHOL, HDL, LDLCALC, TRIG, CHOLHDL, LDLDIRECT in the last 72 hours. Thyroid Function Tests: No results for input(s): TSH, T4TOTAL, FREET4, T3FREE, THYROIDAB in the last 72 hours. Anemia Panel: No results for input(s): VITAMINB12, FOLATE, FERRITIN, TIBC, IRON, RETICCTPCT in the last 72 hours. Sepsis Labs: No results for input(s): PROCALCITON, LATICACIDVEN in the last 168 hours.  Recent Results (from the past 240 hour(s))  Novel Coronavirus, NAA (hospital order; send-out to ref lab)     Status: None   Collection Time: 12/11/18  4:02 PM   Specimen: Nasopharyngeal Swab; Respiratory  Result Value Ref Range Status   SARS-CoV-2, NAA NOT DETECTED NOT DETECTED Final    Comment: (NOTE) This test was developed and its performance characteristics determined by [employer]. This test has not been FDA cleared or approved. This test has been authorized by FDA under an Emergency Use Authorization (EUA). This test is only authorized for the duration of time the declaration that circumstances exist justifying the authorization of the emergency use of in vitro diagnostic tests for detection of SARS-CoV-2 virus and/or diagnosis of COVID-19 infection under section 564(b)(1) of the Act, 21 U.S.C. 975OIT-2(P)(4), unless the authorization is terminated or revoked sooner. When diagnostic testing is negative, the possibility of a false  negative result should be considered in the context of a patient's recent exposures and the presence of clinical signs and symptoms consistent with COVID-19. An individual without symptoms of COVID-19 and who is not shedding SARS-CoV-2 virus would expect to have a negative (not detected) result in this assay. Performed  At: [facility] [address] [place], [place] [id number] Rush Farmer MD EN:[id number]    [place]  Final    Comment: Performed at [facility], [place] [address] [place], [place] [address]     Radiology Studies: No results found.  Scheduled Meds: . arformoterol  15 mcg Nebulization BID  . asenapine  5 mg Sublingual BID  . aspirin  81 mg Oral Daily  . budesonide (PULMICORT) nebulizer solution  0.5 mg Nebulization BID  . buPROPion  150 mg Oral Daily  . diazepam  2 mg Oral BID  . gabapentin  100 mg Oral QHS  . guaiFENesin  600 mg Oral BID  . heparin  5,000 Units Subcutaneous Q8H  . hydrALAZINE  100 mg Oral Q8H  . insulin aspart  0-5 Units Subcutaneous QHS  . insulin aspart  0-9 Units Subcutaneous TID WC  . lurasidone  40 mg Oral BID  . mouth rinse  15 mL Mouth Rinse BID  . methylPREDNISolone (SOLU-MEDROL) injection  60 mg Intravenous Q12H  . metoprolol tartrate  12.5 mg Oral BID  . nicotine  21 mg Transdermal Daily  . NIFEdipine  90 mg Oral Daily  . pantoprazole  40 mg Oral QHS  . pravastatin  20 mg Per Tube q1800  . QUEtiapine  25 mg Oral Daily  . QUEtiapine  50 mg Oral QHS  . sodium chloride  1 g Oral TID WC  . traZODone  50 mg Oral QHS  . valproic acid  500 mg Oral BID   Continuous Infusions: . sodium chloride 250 mL (12/07/18 0943)     LOS: 12 days   Marylu Lund, MD [facility] Pager On Amion  If 7PM-7AM, please contact night-coverage 12/15/2018, 6:44 PM

## 2018-12-15 NOTE — Progress Notes (Signed)
Attempt to call pt aunt per pt request to update on pt status, physical therapy and pt being told that the plan is for her to go to a rehab. Pt states was unaware.

## 2018-12-15 NOTE — Care Management Important Message (Signed)
Important Message  Patient Details  Name: Natalie Thomas MRN: [id number] Date of Birth: 02-Oct-1960   Medicare Important Message Given:  Yes     Orbie Pyo 12/15/2018, 2:11 PM

## 2018-12-16 LAB — GLUCOSE, CAPILLARY
Glucose-Capillary: 121 mg/dL — ABNORMAL HIGH (ref 70–99)
Glucose-Capillary: 173 mg/dL — ABNORMAL HIGH (ref 70–99)
Glucose-Capillary: 177 mg/dL — ABNORMAL HIGH (ref 70–99)
Glucose-Capillary: 205 mg/dL — ABNORMAL HIGH (ref 70–99)

## 2018-12-16 LAB — BASIC METABOLIC PANEL
Anion gap: 8 (ref 5–15)
BUN: 18 mg/dL (ref 6–20)
CO2: 28 mmol/L (ref 22–32)
Calcium: 8.3 mg/dL — ABNORMAL LOW (ref 8.9–10.3)
Chloride: 95 mmol/L — ABNORMAL LOW (ref 98–111)
Creatinine, Ser: 0.53 mg/dL (ref 0.44–1.00)
GFR calc Af Amer: >60 mL/min (ref >60)
GFR calc non Af Amer: >60 mL/min (ref >60)
Glucose, Bld: 120 mg/dL — ABNORMAL HIGH (ref 70–99)
Potassium: 4.9 mmol/L (ref 3.5–5.1)
Sodium: 131 mmol/L — ABNORMAL LOW (ref 135–145)

## 2018-12-16 MED ORDER — HALOPERIDOL LACTATE 5 MG/ML IJ SOLN
3.0000 mg | Freq: Once | INTRAMUSCULAR | Status: AC
  Administered 2018-12-16: 3 mg via INTRAVENOUS
  Filled 2018-12-16: qty 1

## 2018-12-16 NOTE — TOC Progression Note (Signed)
Transition of Care [facility]) - Progression Note    Patient Details  Name: AALINA BREGE MRN: [id number] Date of Birth: [birth year]  Transition of Care [facility]) CM/SW [place], LCSW Phone Number: 12/16/2018, 4:44 PM  Clinical Narrative:    Per [employer], patient's insurance is only good through tomorrow. They requested an updated COVID test for when patient is stable for discharge.    Expected Discharge Plan: Skilled Nursing Facility Barriers to Discharge: Ship broker, Continued Medical Work up  Expected Discharge Plan and Services Expected Discharge Plan: [place] In-house Referral: Clinical Social Work   Post Acute Care Choice: [place] Living arrangements for the past 2 months: [place] Expected Discharge Date: 12/12/18                                     Social Determinants of Health (SDOH) Interventions    Readmission Risk Interventions Readmission Risk Prevention Plan 12/08/2018  Transportation Screening Complete  Medication Review Press photographer) Complete  PCP or Specialist appointment within 3-5 days of discharge Complete  HRI or Home Care Consult Complete  SW Recovery Care/Counseling Consult Complete  Palliative Care Screening Not [place] Complete  Some recent data might be hidden

## 2018-12-16 NOTE — Progress Notes (Signed)
PROGRESS NOTE    Natalie Thomas  GDJ:[id number] DOB: May 28, 1961 DOA: 12/03/2018 PCP: System, Pcp Not In    Brief Narrative:  58 year old female with history of COPD on 2 L, diastolic CHF, hypertension, hyperlipidemia, chronic hyponatremia, polycythemia, depression and schizoaffective disorder admitted with acute on chronic respiratory failure with desaturation to 60% on room air. CXR showed bibasilar consolidation thought to be due to H CAP. ABG with chronic respiratory acidosis. Intubated on 6/10 and admitted to ICU. Started on broad-spectrum antibiotic. COVID-19 and blood cultures negative. Urine showed grew E. coli.  Patient was extubated on 12/04/2018. TRH assumed care on 12/06/2018. Sedating medications have been minimized due to the patient's decreased level of consciousness.   Assessment & Plan:   Active Problems:   Acute hypoxemic respiratory failure (HCC)  Acute on chronic hypoxemic and hypercarbic respiratory failure due to COPD exacerbation and possible CHF exacerbation.  -Blood and sputum cultures negative.  -COVID-19 negative.  -Currently on minimal O2 -Completed course of abx -Currently stable  Acute on chronic hyponatremia:  -Suspecting SIADH -improved overnight with fluid restricted diet, IVF stopped -Labs reviewed, sodium improving with fluid restriction  Acute metabolic encephalopathy:  -Recently resolved, however somewhat confused this AM. Pt had required two doses of haldol overnight.  -suspect secondary to hypercarbic failure and hyponatremia per above  Aggression and agitation:  -appears improved. Seroquel was added -Currently on depakote  Acute on chronic diastolic CHF:  -Echo on 6/22 with EF greater than 65% and no significant structural abnormalities.  -Sodium worsened with lasix, since stopped -Repeat BMET in AM -Pt voiding well, over 5L overnight  E. coli UTI:  -completed antibiotic course with ceftriaxone. 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 Reduced Valium, gabapentin and trazodone as tolerated   Chronic pain/chronic opiate use:  -Continue with scheduled Tylenol 650 mg every 8 hours. Gabapentin and oxycodone was recently reduced as above in the setting of somnolence.  Essential hypertension:  -on Procardia, labetalol, and hydralazine at home. Metoprolol 12.5 mg twice daily-beta-1 selective.  -BP currently improved after metoprolol was increased to 25mg  BID  GERD/constipation: PPI. Scheduled MiraLAX x3 until she has bowel movement.  Inadequate oral intake related to acute illness: Appreciate nutrition input-Magic cup and supplements.  DVT prophylaxis: Heparin subQ Code Status: Full Family Communication: Pt in room, family not at bedside Disposition Plan: Uncertain at this time  Consultants:     Procedures:     Antimicrobials: Anti-infectives (From admission, onward)   Start     Dose/Rate Route Frequency Ordered Stop   12/05/18 1000  cefTRIAXone (ROCEPHIN) 2 g in sodium chloride 0.9 % 100 mL IVPB     2 g 200 mL/hr over 30 Minutes Intravenous Every 24 hours 12/05/18 0814 12/07/18 1015   12/04/18 1800  vancomycin (VANCOCIN) 1,250 mg in sodium chloride 0.9 % 250 mL IVPB  Status:  Discontinued     1,250 mg 166.7 mL/hr over 90 Minutes Intravenous Every 24 hours 12/03/18 1744 12/05/18 1131   12/03/18 1715  vancomycin (VANCOCIN) 1,500 mg in sodium chloride 0.9 % 500 mL IVPB     1,500 mg 250 mL/hr over 120 Minutes Intravenous  Once 12/03/18 1709 12/03/18 2037   12/03/18 1715  ceFEPIme (MAXIPIME) 2 g in sodium chloride 0.9 % 100 mL IVPB  Status:  Discontinued     2 g 200 mL/hr over 30 Minutes Intravenous Every 8 hours 12/03/18 1709 12/05/18 2979  Subjective: No complaints this AM  Objective: Vitals:   12/16/18 0807 12/16/18 0845 12/16/18 0847 12/16/18 1211  BP: (!) 174/47   (!)  147/65  Pulse: 88   64  Resp: 16   17  Temp: 98.7 F (37.1 C)   97.6 F (36.4 C)  TempSrc: Oral   Oral  SpO2: 95% 96% 100% 94%  Weight:      Height:        Intake/Output Summary (Last 24 hours) at 12/16/2018 1454 Last data filed at 12/16/2018 0809 Gross per 24 hour  Intake 240 ml  Output 6950 ml  Net -6710 ml   Filed Weights   12/14/18 0500 12/15/18 0300 12/16/18 0500  Weight: 100.5 kg 100.7 kg 100.9 kg    Examination: General exam: Conversant, in no acute distress Respiratory system: normal chest rise, clear, no audible wheezing Cardiovascular system: regular rhythm, s1-s2 Gastrointestinal system: Nondistended, nontender, pos BS Central nervous system: No seizures, no tremors Extremities: No cyanosis, no joint deformities Skin: No rashes, no pallor Psychiatry: Affect normal // no auditory hallucinations   Data Reviewed: I have personally reviewed following labs and imaging studies  CBC: Recent Labs  Lab 12/10/18 0623 12/11/18 0614  WBC 7.0 7.3  HGB 14.4 14.8  HCT 42.8 44.4  MCV 90.1 92.1  PLT 201 628   Basic Metabolic Panel: Recent Labs  Lab 12/10/18 0623  12/11/18 0614 12/14/18 0942 12/14/18 1706 12/15/18 0843 12/16/18 0831  NA 124*   < > 131* 126* 123* 130* 131*  K 4.8   < > 4.6 4.7 4.5 4.5 4.9  CL 83*   < > 90* 89* 88* 92* 95*  CO2 31   < > 34* 27 25 29 28   GLUCOSE 143*   < > 120* 161* 159* 125* 120*  BUN 19   < > 18 15 19 16 18   CREATININE 0.76   < > 0.52 0.53 0.47 0.49 0.53  CALCIUM 8.3*   < > 8.6* 8.2* 8.2* 8.5* 8.3*  MG 1.9  --  2.0  --   --   --   --    < > = values in this interval not displayed.   GFR: Estimated Creatinine Clearance: 87.9 mL/min (by C-G formula based on SCr of 0.53 mg/dL). Liver Function Tests: No results for input(s): AST, ALT, ALKPHOS, BILITOT, PROT, ALBUMIN in the last 168 hours. No results for input(s): LIPASE, AMYLASE in the last 168 hours. No results for input(s): AMMONIA in the last 168 hours. Coagulation  Profile: No results for input(s): INR, PROTIME in the last 168 hours. Cardiac Enzymes: No results for input(s): CKTOTAL, CKMB, CKMBINDEX, TROPONINI in the last 168 hours. BNP (last 3 results) No results for input(s): PROBNP in the last 8760 hours. HbA1C: No results for input(s): HGBA1C in the last 72 hours. CBG: Recent Labs  Lab 12/15/18 1133 12/15/18 1630 12/15/18 2145 12/16/18 0805 12/16/18 1215  GLUCAP 161* 112* 195* 121* 173*   Lipid Profile: No results for input(s): CHOL, HDL, LDLCALC, TRIG, CHOLHDL, LDLDIRECT in the last 72 hours. Thyroid Function Tests: No results for input(s): TSH, T4TOTAL, FREET4, T3FREE, THYROIDAB in the last 72 hours. Anemia Panel: No results for input(s): VITAMINB12, FOLATE, FERRITIN, TIBC, IRON, RETICCTPCT in the last 72 hours. Sepsis Labs: No results for input(s): PROCALCITON, LATICACIDVEN in the last 168 hours.  Recent Results (from the past 240 hour(s))  Novel Coronavirus, NAA (hospital order; send-out to ref lab)     Status: None   Collection  Time: 12/11/18  4:02 PM   Specimen: Nasopharyngeal Swab; Respiratory  Result Value Ref Range Status   SARS-CoV-2, NAA NOT DETECTED NOT DETECTED Final    Comment: (NOTE) This test was developed and its performance characteristics determined by [employer]. This test has not been FDA cleared or approved. This test has been authorized by FDA under an Emergency Use Authorization (EUA). This test is only authorized for the duration of time the declaration that circumstances exist justifying the authorization of the emergency use of in vitro diagnostic tests for detection of SARS-CoV-2 virus and/or diagnosis of COVID-19 infection under section 564(b)(1) of the Act, 21 U.S.C. 374MOL-0(B)(8), unless the authorization is terminated or revoked sooner. When diagnostic testing is negative, the possibility of a false negative result should be considered in the context of a patient's recent exposures and  the presence of clinical signs and symptoms consistent with COVID-19. An individual without symptoms of COVID-19 and who is not shedding SARS-CoV-2 virus would expect to have a negative (not detected) result in this assay. Performed  At: [facility] [address] [place], [place] [id number] Rush Farmer MD EO:[id number]    [place]  Final    Comment: Performed at [facility], [place] [address], [place], [place] [address]     Radiology Studies: No results found.  Scheduled Meds: . arformoterol  15 mcg Nebulization BID  . asenapine  5 mg Sublingual BID  . aspirin  81 mg Oral Daily  . budesonide (PULMICORT) nebulizer solution  0.5 mg Nebulization BID  . buPROPion  150 mg Oral Daily  . diazepam  2 mg Oral BID  . gabapentin  100 mg Oral QHS  . guaiFENesin  600 mg Oral BID  . heparin  5,000 Units Subcutaneous Q8H  . hydrALAZINE  100 mg Oral Q8H  . insulin aspart  0-5 Units Subcutaneous QHS  . insulin aspart  0-9 Units Subcutaneous TID WC  . lurasidone  40 mg Oral BID  . mouth rinse  15 mL Mouth Rinse BID  . methylPREDNISolone (SOLU-MEDROL) injection  60 mg Intravenous Q12H  . metoprolol tartrate  25 mg Oral BID  . nicotine  21 mg Transdermal Daily  . NIFEdipine  90 mg Oral Daily  . pantoprazole  40 mg Oral QHS  . pravastatin  20 mg Per Tube q1800  . QUEtiapine  25 mg Oral Daily  . QUEtiapine  50 mg Oral QHS  . sodium chloride  1 g Oral TID WC  . traZODone  50 mg Oral QHS  . valproic acid  500 mg Oral BID   Continuous Infusions: . sodium chloride 250 mL (12/07/18 0943)     LOS: 13 days   Marylu Lund, MD [facility] Pager On Amion  If 7PM-7AM, please contact night-coverage 12/16/2018, 2:54 PM

## 2018-12-16 NOTE — Progress Notes (Signed)
Patient alert and oriented x 4 with intermittent confusion throughout shift.  Patient anxiety and confusion increased during shift leading to manic outburst and delusions.  Patient redirected and provided prn Haldol.  Hospitalist informed of change in status.  Post prn patient calm and resting for the remainder of shift.  Continues with hypertension managed with PRN and scheduled antihypertensives.  Stable condition, will continue to monitor.

## 2018-12-16 NOTE — Progress Notes (Signed)
Nutrition Follow-up  DOCUMENTATION CODES:   Obesity unspecified  INTERVENTION:   -Continue Magic cup TID with meals, each supplement provides 290 kcal and 9 grams of protein  NUTRITION DIAGNOSIS:   Inadequate oral intake related to acute illness(PNA) as evidenced by meal completion < 50%.  Ongoing.  GOAL:   Patient will meet greater than or equal to 90% of their needs  Progressing.  MONITOR:   PO intake, Supplement acceptance, Skin  ASSESSMENT:   58 yo female admitted with acute respiratory failure 2/2 HCAP, pulmonary edema requiring intubation, worsening chronic hyponatremia. PMH includes HTN, HLD, CHF, COPD, chronic hyponatremia, polycythemia, depression, schizoaffective disorder  **RD working remotely**  Per chart review, pt has been consuming 80-100% of meals.  Pt has had intermittent confusion. Pt's discharge has been dependent on improving sodium levels. Will continue to monitor plan.  Per weight records, pt's weight has remained 221-222 lbs since 6/18. This is +17 lbs since admit.   Medications reviewed. Labs reviewed: CBGs: 121-195 Low Na  Diet Order:   Diet Order            DIET DYS 3 Room service appropriate? Yes with Assist; Fluid consistency: Thin; Fluid restriction: 1200 mL Fluid  Diet effective now        Diet - low sodium heart healthy              EDUCATION NEEDS:   Not appropriate for education at this time  Skin:  Skin Assessment: (MASD: abdomen)  Last BM:  6/19  Height:   Ht Readings from Last 1 Encounters:  12/03/18 5\' 3"  (1.6 m)    Weight:   Wt Readings from Last 1 Encounters:  12/16/18 100.9 kg    Ideal Body Weight:  52.3 kg  BMI:  Body mass index is 39.4 kg/m.  Estimated Nutritional Needs:   Kcal:  1700-1900  Protein:  85-100 grams  Fluid:  > 1.7 L/day   Clayton Bibles, MS, RD, LDN [place] Dietitian Pager: [phone] After Hours Pager: [phone]

## 2018-12-17 LAB — COMPREHENSIVE METABOLIC PANEL
ALT: 18 U/L (ref 0–44)
AST: 11 U/L — ABNORMAL LOW (ref 15–41)
Albumin: 2.7 g/dL — ABNORMAL LOW (ref 3.5–5.0)
Alkaline Phosphatase: 41 U/L (ref 38–126)
Anion gap: 8 (ref 5–15)
BUN: 19 mg/dL (ref 6–20)
CO2: 29 mmol/L (ref 22–32)
Calcium: 8.6 mg/dL — ABNORMAL LOW (ref 8.9–10.3)
Chloride: 93 mmol/L — ABNORMAL LOW (ref 98–111)
Creatinine, Ser: 0.64 mg/dL (ref 0.44–1.00)
GFR calc Af Amer: >60 mL/min (ref >60)
GFR calc non Af Amer: >60 mL/min (ref >60)
Glucose, Bld: 123 mg/dL — ABNORMAL HIGH (ref 70–99)
Potassium: 4.1 mmol/L (ref 3.5–5.1)
Sodium: 130 mmol/L — ABNORMAL LOW (ref 135–145)
Total Bilirubin: 0.5 mg/dL (ref 0.3–1.2)
Total Protein: 5.6 g/dL — ABNORMAL LOW (ref 6.5–8.1)

## 2018-12-17 LAB — GLUCOSE, CAPILLARY
Glucose-Capillary: 90 mg/dL (ref 70–99)
Glucose-Capillary: 131 mg/dL — ABNORMAL HIGH (ref 70–99)
Glucose-Capillary: 198 mg/dL — ABNORMAL HIGH (ref 70–99)
Glucose-Capillary: 134 mg/dL — ABNORMAL HIGH (ref 70–99)

## 2018-12-17 LAB — NOVEL CORONAVIRUS, NAA (HOSP ORDER, SEND-OUT TO REF LAB; TAT 18-24 HRS): SARS-CoV-2, NAA: NOT DETECTED

## 2018-12-17 MED ORDER — METOPROLOL TARTRATE 25 MG PO TABS: 25.0000 mg | ORAL_TABLET | Freq: Two times a day (BID) | ORAL | 0 refills | Status: DC

## 2018-12-17 MED ORDER — SODIUM CHLORIDE 1 G PO TABS: 1.0000 g | ORAL_TABLET | Freq: Three times a day (TID) | ORAL | 0 refills | Status: DC

## 2018-12-17 MED ORDER — PREDNISONE 20 MG PO TABS
40.0000 mg | ORAL_TABLET | Freq: Every day | ORAL | Status: DC
  Administered 2018-12-17 – 2018-12-18 (×2): 40 mg via ORAL
  Filled 2018-12-17 (×2): qty 2

## 2018-12-17 NOTE — Discharge Summary (Addendum)
Discharge Summary  Natalie Thomas WCB:[id number] DOB: 1960-12-05  PCP: System, Pcp Not In  Admit date: 12/03/2018 Discharge date: 12/17/2018  Time spent: 35 minutes  Recommendations for Outpatient Follow-up:  1. Follow-up with your primary care provider 2. Take your medications as prescribed 3. Continue physical therapy 4. Fall precautions.  Discharge Diagnoses:  Active Hospital Problems   Diagnosis Date Noted  . Acute hypoxemic respiratory failure ([place]) 12/03/2018    Resolved Hospital Problems  No resolved problems to display.    Discharge Condition: Stable  Diet recommendation: Resume previous diet  Vitals:   12/17/18 0819 12/17/18 0822  BP:    Pulse:    Resp:    Temp:    SpO2: 95% 100%    History of present illness:   58 year old female with history of COPD on 2 L of oxygen by nasal cannula continuously, chronic diastolic CHF, hypertension, hyperlipidemia, chronic hyponatremia, polycythemia, depression and schizoaffective disorder admitted with acute on chronic respiratory failure with desaturation to 60% on room air. CXR showed bibasilar consolidation thought to be due to HCAP. ABG with chronic respiratory acidosis. Intubated on 6/10 and admitted to ICU. Started on broad-spectrum antibiotic. COVID-19 and blood cultures negative. Urine showed grew E. coli greater than 100,000 colonies.  Patient was extubated on 12/04/2018. [facility] assumed care on 12/06/2018.  Completed course of IV antibiotics.  Blood cultures negative to date no growth x6 days.  12/17/18: Patient seen and examined at her bedside.  No acute events overnight.  She denies chest pain, shortness of breath, cough, or any GI symptoms.  She has no new concerns.  On the day of discharge, the patient was hemodynamically stable.  She will need to follow-up with her primary care provider and continue physical therapy.  She will also need to abide by fall precautions.  Discharge delayed due to awaiting result  of send out covid-19 test.  Hospital Course:  Active Problems:   Acute hypoxemic respiratory failure ([facility])  Resolved acute on chronic hypoxemic and hypercarbic respiratory failure due to COPD exacerbation and possible CHF exacerbation.  -Blood and sputum cultures negative.  -COVID-19 negative.  -Currently on minimal O2 -Completed course of abx -Currently stable -At rest O2 saturation 97% on room air. -Repeat Covid-19 send out pending  Acute on chronic euvolemic hyponatremia:  -Suspecting SIADH -improved overnight with fluid restricted diet, IVF stopped -Continue fluid restriction <1200 cc/day -Continue salt tablet 1 g 3 times daily with meals -Repeat BMP on Monday  Acute metabolic encephalopathy:  -Resolved  Resolving aggression and agitation:  -Appears improved on Seroquel -Continue Seroquel \  Acute on chronic diastolic CHF:  -Echo on 6/16 with EF greater than 65% and no significant structural abnormalities.  -Sodium worsened with lasix, since stopped -Repeat BMET in AM --28.8 L net I&O since admission  E. coli UTI:  -Urine culture revealed greater than 100,000 colonies E. coli.  Sensitive to ceftriaxone which she completed -Denies dysuria on 12/17/2018  Depression/anxiety/schizoaffective disorder:  -No acute issues -Continue home Depakote, - Continue home carcinoma pain, Latuda and Paxil. Reduced Valium, gabapentin and trazodone as tolerated   Chronic pain/chronic opiate use:  -Continue with scheduled Tylenol 650 mg every 8 hours. Gabapentin and oxycodone was recently reduced as above in the setting of somnolence.  Essential hypertension:  -on Procardia, labetalol, and hydralazine at home. Metoprolol 12.5 mg twice daily-beta-1 selective.  -BP currently improved after metoprolol was increased to 25mg  BID  GERD/constipation: PPI.  Continue bowel regimen.    Code Status: Full  Antimicrobials:             Anti-infectives (From admission,  onward)       Start        Dose/Rate  Route  Frequency  Ordered  Stop     12/05/18 1000    cefTRIAXone (ROCEPHIN) 2 g in sodium chloride 0.9 % 100 mL IVPB       2 g  200 mL/hr over 30 Minutes  Intravenous  Every 24 hours  12/05/18 0814  12/07/18 1015     12/04/18 1800    vancomycin (VANCOCIN) 1,250 mg in sodium chloride 0.9 % 250 mL IVPB  Status:  Discontinued       1,250 mg  166.7 mL/hr over 90 Minutes  Intravenous  Every 24 hours  12/03/18 1744  12/05/18 1131     12/03/18 1715    vancomycin (VANCOCIN) 1,500 mg in sodium chloride 0.9 % 500 mL IVPB       1,500 mg  250 mL/hr over 120 Minutes  Intravenous   Once  12/03/18 1709  12/03/18 2037     12/03/18 1715    ceFEPIme (MAXIPIME) 2 g in sodium chloride 0.9 % 100 mL IVPB  Status:  Discontinued       2 g  200 mL/hr over 30 Minutes  Intravenous  Every 8 hours  12/03/18 1709  12/05/18 0814           Procedures:  Intubation on 12/03/18  Extubation on 12/04/2018  Consultations:  None   Discharge Exam: BP 102/61   Pulse 65   Temp 98.7 F (37.1 C)   Resp 18   Ht 5\' 3"  (1.6 m)   Wt 100.9 kg   SpO2 100%   BMI 39.40 kg/m  . General: 58 y.o. year-old female well developed well nourished in no acute distress.  Alert and oriented x3. . Cardiovascular: Regular rate and rhythm with no rubs or gallops.  No thyromegaly or JVD noted.   Marland Kitchen Respiratory: Clear to auscultation with no wheezes or rales. Good inspiratory effort. . Abdomen: Soft nontender nondistended with normal bowel sounds x4 quadrants. . Musculoskeletal: No lower extremity edema. 2/4 pulses in all 4 extremities. . Skin: No ulcerative lesions noted or rashes, . Psychiatry: Mood is appropriate for condition and setting  Discharge Instructions You were cared for by a hospitalist during your hospital stay. If you have any questions about your discharge medications or the care you received while you were in the  hospital after you are discharged, you can call the unit and asked to speak with the hospitalist on call if the hospitalist that took care of you is not available. Once you are discharged, your primary care physician will handle any further medical issues. Please note that NO REFILLS for any discharge medications will be authorized once you are discharged, as it is imperative that you return to your primary care physician (or establish a relationship with a primary care physician if you do not have one) for your aftercare needs so that they can reassess your need for medications and monitor your lab values.  Discharge Instructions    (HEART FAILURE PATIENTS) Call MD:  Anytime you have any of the following symptoms: 1) 3 pound weight gain in 24 hours or 5 pounds in 1 week 2) shortness of breath, with or without a dry hacking cough 3) swelling in the hands, feet or stomach 4) if you have to sleep on extra pillows at night in order to breathe.  Complete by: As directed    Activity as tolerated - No restrictions   Complete by: As directed    Activity as tolerated - No restrictions   Complete by: As directed    Call MD for:  difficulty breathing, headache or visual disturbances   Complete by: As directed    Call MD for:  difficulty breathing, headache or visual disturbances   Complete by: As directed    Call MD for:  persistant nausea and vomiting   Complete by: As directed    Call MD for:  persistant nausea and vomiting   Complete by: As directed    Call MD for:  severe uncontrolled pain   Complete by: As directed    Diet - low sodium heart healthy   Complete by: As directed    Discharge instructions   Complete by: As directed    PT/OT eval and treat. Follow up with PCP in 7-10 days after discharge.   Discharge instructions   Complete by: As directed    Follow up with PCP in 7-10 days. Daily weights.   Increase activity slowly   Complete by: As directed    Increase activity slowly    Complete by: As directed      Allergies as of 12/17/2018      Reactions   Clarithromycin Itching   Penicillins Itching   Has tolerated cefazolin before Has patient had a PCN reaction causing immediate rash, facial/tongue/throat swelling, SOB or lightheadedness with hypotension: No Has patient had a PCN reaction causing severe rash involving mucus membranes or skin necrosis: No Has patient had a PCN reaction that required hospitalization: Unknown Has patient had a PCN reaction occurring within the last 10 years: No If all of the above answers are "NO", then may proceed with Cephalosporin use.      Medication List    STOP taking these medications   albuterol 108 (90 Base) MCG/ACT inhaler Commonly known as: VENTOLIN HFA Replaced by: albuterol (2.5 MG/3ML) 0.083% nebulizer solution   dicyclomine 20 MG tablet Commonly known as: BENTYL   divalproex 250 MG DR tablet Commonly known as: DEPAKOTE   divalproex 500 MG DR tablet Commonly known as: DEPAKOTE   hydrALAZINE 100 MG tablet Commonly known as: APRESOLINE   loperamide 2 MG tablet Commonly known as: IMODIUM A-D   loratadine 10 MG tablet Commonly known as: CLARITIN   magnesium oxide 400 (241.3 Mg) MG tablet Commonly known as: MAG-OX   Melatonin 10 MG Tabs   neomycin-bacitracin-polymyxin ointment Commonly known as: NEOSPORIN   nystatin cream Commonly known as: MYCOSTATIN   oyster calcium 500 MG Tabs tablet   PARoxetine 20 MG tablet Commonly known as: PAXIL   PARoxetine 30 MG tablet Commonly known as: PAXIL   PROBIOTIC PO     TAKE these medications   acetaminophen 325 MG tablet Commonly known as: TYLENOL Take 650 mg by mouth 2 (two) times daily as needed (pain). Do not exceed 3 grams/24 hours of tylenol from all sources What changed: Another medication with the same name was removed. Continue taking this medication, and follow the directions you see here.   albuterol (2.5 MG/3ML) 0.083% nebulizer solution  Commonly known as: PROVENTIL Take 3 mLs (2.5 mg total) by nebulization every 4 (four) hours as needed for wheezing or shortness of breath. Replaces: albuterol 108 (90 Base) MCG/ACT inhaler   aluminum-magnesium hydroxide-simethicone 703-500-93 MG/5ML Susp Commonly known as: MAALOX Take 30 mLs by mouth 4 (four) times daily as needed (heartburn, indigestion).  arformoterol 15 MCG/2ML Nebu Commonly known as: BROVANA Take 2 mLs (15 mcg total) by nebulization 2 (two) times daily.   aspirin 81 MG tablet Take 1 tablet (81 mg total) by mouth daily. For heart health   benzonatate 100 MG capsule Commonly known as: TESSALON Take 1 capsule (100 mg total) by mouth 3 (three) times daily as needed for cough.   Breo Ellipta 100-25 MCG/INH Aepb Generic drug: fluticasone furoate-vilanterol Inhale 1 puff into the lungs daily.   budesonide 0.5 MG/2ML nebulizer solution Commonly known as: PULMICORT Take 2 mLs (0.5 mg total) by nebulization 2 (two) times daily.   buPROPion 150 MG 24 hr tablet Commonly known as: WELLBUTRIN XL Take 150 mg by mouth daily. What changed: Another medication with the same name was removed. Continue taking this medication, and follow the directions you see here.   calcium carbonate 500 MG chewable tablet Commonly known as: TUMS - dosed in mg elemental calcium Chew 1 tablet (200 mg of elemental calcium total) by mouth 2 (two) times daily as needed for indigestion or heartburn.   diazepam 2 MG tablet Commonly known as: VALIUM Take 1 tablet (2 mg total) by mouth 2 (two) times a day. What changed:   medication strength  how much to take   docusate sodium 100 MG capsule Commonly known as: COLACE Take 100 mg by mouth 2 (two) times daily as needed (constipation).   ferrous sulfate 325 (65 FE) MG tablet Take 325 mg by mouth daily.   fluticasone 50 MCG/ACT nasal spray Commonly known as: FLONASE Place 2 sprays into both nostrils 2 (two) times daily.   furosemide 20  MG tablet Commonly known as: LASIX Take 20 mg by mouth 2 (two) times daily.   gabapentin 100 MG capsule Commonly known as: NEURONTIN Take 1 capsule (100 mg total) by mouth at bedtime. What changed:   medication strength  how much to take  when to take this   hydrOXYzine 25 MG tablet Commonly known as: ATARAX/VISTARIL Take 1 tablet (25 mg total) by mouth 3 (three) times daily as needed for anxiety.   ipratropium-albuterol 0.5-2.5 (3) MG/3ML Soln Commonly known as: DUONEB Take 3 mLs by nebulization See admin instructions. Inhale one vial via hand held nebulizer three times daily, may also use every 8 hours as needed for shortness of breath   lurasidone 40 MG Tabs tablet Commonly known as: LATUDA Take 40 mg by mouth 2 (two) times a day. What changed: Another medication with the same name was removed. Continue taking this medication, and follow the directions you see here.   metoprolol tartrate 25 MG tablet Commonly known as: LOPRESSOR Take 1 tablet (25 mg total) by mouth 2 (two) times daily.   nicotine 21 mg/24hr patch Commonly known as: NICODERM CQ - dosed in mg/24 hours Place 1 patch (21 mg total) onto the skin daily.   NIFEdipine 90 MG 24 hr tablet Commonly known as: PROCARDIA XL/NIFEDICAL-XL Take 90 mg by mouth daily.   omeprazole 20 MG capsule Commonly known as: PRILOSEC Take 1 capsule (20 mg total) by mouth 2 (two) times daily before a meal. For acid reflux. What changed: when to take this   oxyCODONE 5 MG immediate release tablet Commonly known as: Oxy IR/ROXICODONE Take 1 tablet (5 mg total) by mouth daily.   polyethylene glycol 17 g packet Commonly known as: MiraLax Take 17 g by mouth daily. What changed:   when to take this  reasons to take this   pravastatin 20  MG tablet Commonly known as: PRAVACHOL Take 20 mg by mouth daily.   predniSONE 20 MG tablet Commonly known as: DELTASONE Take 2 tablets (40 mg total) by mouth daily for 3 days, THEN 1  tablet (20 mg total) daily for 3 days, THEN 0.5 tablets (10 mg total) daily for 3 days. Start taking on: December 11, 2018   QUEtiapine 50 MG tablet Commonly known as: SEROQUEL Take 1 tablet (50 mg total) by mouth at bedtime.   QUEtiapine 25 MG tablet Commonly known as: SEROQUEL Take 1 tablet (25 mg total) by mouth daily.   Robafen 100 MG/5ML syrup Generic drug: guaifenesin Take 200 mg by mouth 3 (three) times daily as needed for cough or congestion.   Saphris 5 MG Subl 24 hr tablet Generic drug: asenapine Place 1 tablet (5 mg total) under the tongue 2 (two) times daily.   sodium chloride 1 g tablet Take 1 tablet (1 g total) by mouth 3 (three) times daily with meals. What changed: when to take this   traZODone 50 MG tablet Commonly known as: DESYREL Take 1 tablet (50 mg total) by mouth at bedtime. What changed:   medication strength  how much to take   valproic acid 250 MG capsule Commonly known as: DEPAKENE Take 2 capsules (500 mg total) by mouth 2 (two) times daily.      Allergies  Allergen Reactions  . Clarithromycin Itching  . Penicillins Itching    Has tolerated cefazolin before  Has patient had a PCN reaction causing immediate rash, facial/tongue/throat swelling, SOB or lightheadedness with hypotension: No Has patient had a PCN reaction causing severe rash involving mucus membranes or skin necrosis: No Has patient had a PCN reaction that required hospitalization: Unknown Has patient had a PCN reaction occurring within the last 10 years: No If all of the above answers are "NO", then may proceed with Cephalosporin use.     Contact information for follow-up providers    [facility]. Call in 1 day(s).   Why: Please call for post hospital follow-up appointment. Contact information: [address] [place] [address] [phone]           Contact information for after-discharge care    Destination     [facility] Preferred SNF .   Service: Skilled Nursing Contact information: [address] [place] [phone]                   The results of significant diagnostics from this hospitalization (including imaging, microbiology, ancillary and laboratory) are listed below for reference.    Significant Diagnostic Studies: Dg Chest Port 1 View  Result Date: 12/05/2018 CLINICAL DATA:  Acute respiratory failure EXAM: PORTABLE CHEST 1 VIEW COMPARISON:  12/04/2018 FINDINGS: Cardiac shadow is enlarged but stable. Aortic calcifications are again seen. Bilateral pleural effusions with underlying atelectatic changes are again seen and stable. Endotracheal tube and gastric catheter have been removed in the interval. IMPRESSION: Stable effusions and atelectatic changes. No new focal abnormality is noted. Electronically Signed   By: Inez Catalina M.D.   On: 12/05/2018 07:36   Dg Chest Port 1 View  Result Date: 12/04/2018 CLINICAL DATA:  respiratory failurerespiratory failure EXAM: PORTABLE CHEST 1 VIEW COMPARISON:  12/03/2018 FINDINGS: Endotracheal tube appears in good position. NG tube extends into the stomach. Stable cardiomegaly. There is bibasilar atelectasis and bibasilar pleural effusions. Upper lungs are clear. IMPRESSION: 1. No significant change. 2. Stable support apparatus.  3. bilateral pleural effusions and bibasilar atelectasis Electronically Signed   By: Suzy Bouchard M.D.   On: 12/04/2018 08:42   Dg Chest Portable 1 View  Result Date: 12/03/2018 CLINICAL DATA:  Hypoxia EXAM: PORTABLE CHEST 1 VIEW COMPARISON:  Nov 08, 2018 FINDINGS: Endotracheal tube tip is 1.2 cm above the carina. Nasogastric tube tip and side port are below the diaphragm. No pneumothorax. There is pleural effusion with airspace consolidation in much of the right middle and lower lobes. There is consolidation in the medial left base. There is cardiomegaly with pulmonary  vascularity normal. There is aortic atherosclerosis. No adenopathy. No bone lesions. IMPRESSION: Tube positions as described without pneumothorax. Note that the endotracheal tube tip is near the carina; it may be prudent to consider withdrawing endotracheal tube 2 to 2.5 cm. Right pleural effusion with consolidation throughout much of the right middle and lower lobes. There is also consolidation in the medial left base. There is stable cardiomegaly. Aortic Atherosclerosis (ICD10-I70.0). Electronically Signed   By: Lowella Grip III M.D.   On: 12/03/2018 15:47   Dg Chest Port 1 View  Result Date: 12/03/2018 CLINICAL DATA:  Oxygen desaturation. EXAM: PORTABLE CHEST 1 VIEW COMPARISON:  Single-view of the chest 11/08/2018. PA and lateral chest 07/04/2018 and 06/09/2018. CT chest 05/14/2018. FINDINGS: The lungs are emphysematous. There is marked cardiomegaly. Mild interstitial edema is present. Hazy opacities in the lung bases likely represent small effusions. No pneumothorax. Atherosclerosis noted. No acute bony abnormality. IMPRESSION: Cardiomegaly with mild interstitial edema and small pleural effusions. Atherosclerosis. Emphysema. Electronically Signed   By: Inge Rise M.D.   On: 12/03/2018 10:35    Microbiology: Recent Results (from the past 240 hour(s))  Novel Coronavirus, NAA (hospital order; send-out to ref lab)     Status: None   Collection Time: 12/11/18  4:02 PM   Specimen: Nasopharyngeal Swab; Respiratory  Result Value Ref Range Status   SARS-CoV-2, NAA NOT DETECTED NOT DETECTED Final    Comment: (NOTE) This test was developed and its performance characteristics determined by [employer]. This test has not been FDA cleared or approved. This test has been authorized by FDA under an Emergency Use Authorization (EUA). This test is only authorized for the duration of time the declaration that circumstances exist justifying the authorization of the emergency use of in vitro  diagnostic tests for detection of SARS-CoV-2 virus and/or diagnosis of COVID-19 infection under section 564(b)(1) of the Act, 21 U.S.C. 381WEX-9(B)(7), unless the authorization is terminated or revoked sooner. When diagnostic testing is negative, the possibility of a false negative result should be considered in the context of a patient's recent exposures and the presence of clinical signs and symptoms consistent with COVID-19. An individual without symptoms of COVID-19 and who is not shedding SARS-CoV-2 virus would expect to have a negative (not detected) result in this assay. Performed  At: [facility] [address] [place], [place] [id number] Rush Farmer MD BO:[id number]    [place]  Final    Comment: Performed at [facility], [place] [address], [place], [place] [address]     Labs: Basic Metabolic Panel: Recent Labs  Lab 12/11/18 [phone] 12/14/18 6144 12/14/18 1706 12/15/18 0843 12/16/18 0831 12/17/18 0619  NA 131* 126* 123* 130* 131* 130*  K 4.6 4.7 4.5 4.5 4.9 4.1  CL 90* 89* 88* 92* 95* 93*  CO2 34* 27 25 29 28 29   GLUCOSE 120* 161* 159* 125* 120* 123*  BUN 18 15 19  16  18 19  CREATININE 0.52 0.53 0.47 0.49 0.53 0.64  CALCIUM 8.6* 8.2* 8.2* 8.5* 8.3* 8.6*  MG 2.0  --   --   --   --   --    Liver Function Tests: Recent Labs  Lab 12/17/18 0619  AST 11*  ALT 18  ALKPHOS 41  BILITOT 0.5  PROT 5.6*  ALBUMIN 2.7*   No results for input(s): LIPASE, AMYLASE in the last 168 hours. No results for input(s): AMMONIA in the last 168 hours. CBC: Recent Labs  Lab 12/11/18 0614  WBC 7.3  HGB 14.8  HCT 44.4  MCV 92.1  PLT 205   Cardiac Enzymes: No results for input(s): CKTOTAL, CKMB, CKMBINDEX, TROPONINI in the last 168 hours. BNP: BNP (last 3 results) Recent Labs    06/17/18 2133 12/03/18 0957 12/06/18 0911  BNP 125.4* 296.6* 132.3*    ProBNP (last 3 results) No results for input(s): PROBNP in the last 8760 hours.   CBG: Recent Labs  Lab 12/15/18 2145 12/16/18 0805 12/16/18 1215 12/16/18 1544 12/16/18 2114  GLUCAP 195* 121* 173* 177* 205*       Signed:  Kayleen Memos, MD [facility] 12/17/2018, 10:45 AM

## 2018-12-17 NOTE — Progress Notes (Signed)
Occupational Therapy Treatment Patient Details Name: Natalie Thomas MRN: [id number] DOB: 04-22-1961 Today's Date: 12/17/2018    History of present illness Pt adm 12/03/18 with hypoxic respiratory failure due to copd exacerbation and PNA. PMH - rt hip fx, copd, schizoaffective disorder, htn, dm, chf, lumbar disc disease, pelvic fx   OT comments  Pt ambulating in room with RW, minguardA and O2 on 3L with >90% with exertion. Pt stood at sink for grooming with minguardA for safety and stability. Pt leaning one hand on counter to assist with balance at sink. Pt performing LB dressing in recliner independently to adjust socks. Pt progressing well and tolerating OT session. Pt requires continued OT skilled services for ADL, mobility and safety in SNF setting. OT to focus on mobility and self care acutely.    Follow Up Recommendations  SNF    Equipment Recommendations  3 in 1 bedside commode    Recommendations for Other Services      Precautions / Restrictions Precautions Precautions: Fall;Other (comment) Precaution Comments: monitor O2 sats Restrictions Weight Bearing Restrictions: No       Mobility Bed Mobility Overal bed mobility: Needs Assistance Bed Mobility: Supine to Sit     Supine to sit: Supervision;HOB elevated Sit to supine: Supervision   General bed mobility comments: supervision for safety  Transfers Overall transfer level: Needs assistance Equipment used: Rolling walker (2 wheeled) Transfers: Sit to/from Stand Sit to Stand: Min guard Stand pivot transfers: Min guard       General transfer comment: minguardA for safety    Balance Overall balance assessment: Needs assistance Sitting-balance support: Feet supported;Bilateral upper extremity supported Sitting balance-Leahy Scale: Fair     Standing balance support: Bilateral upper extremity supported Standing balance-Leahy Scale: Poor                             ADL either performed or  assessed with clinical judgement   ADL Overall ADL's : Needs assistance/impaired Eating/Feeding: Modified independent;Sitting   Grooming: Min guard;Standing               Lower Body Dressing: Sit to/from stand;Min guard Lower Body Dressing Details (indicate cue type and reason): adjusted socks sitting in recliner             Functional mobility during ADLs: Min guard;Rolling walker General ADL Comments: SPO2 on 3L O2 >90% with exertion. Pt fatigues easily unable to stand >1 min for ADL tasks.     Vision   Vision Assessment?: No apparent visual deficits   Perception     Praxis      Cognition Arousal/Alertness: Awake/alert Behavior During Therapy: WFL for tasks assessed/performed Overall Cognitive Status: No family/caregiver present to determine baseline cognitive functioning                                          Exercises     Shoulder Instructions       General Comments      Pertinent Vitals/ Pain       Pain Assessment: No/denies pain  Home Living                                          Prior Functioning/Environment  Frequency  Min 2X/week        Progress Toward Goals  OT Goals(current goals can now be found in the care plan section)  Progress towards OT goals: Progressing toward goals  Acute Rehab OT Goals Patient Stated Goal: to not have to go to rehab OT Goal Formulation: With patient Time For Goal Achievement: 12/22/18 Potential to Achieve Goals: Good ADL Goals Pt Will Perform Grooming: with modified independence;standing;sitting Pt Will Perform Upper Body Dressing: with modified independence Pt Will Perform Lower Body Dressing: with modified independence;sit to/from stand Pt Will Transfer to Toilet: with modified independence;ambulating Pt Will Perform Tub/Shower Transfer: with modified independence;ambulating Additional ADL Goal #1: Pt will demonstrate use of 3 energy  conservation strategies during ADL.  Plan Discharge plan remains appropriate    Co-evaluation                 AM-PAC OT "6 Clicks" Daily Activity     Outcome Measure   Help from another person eating meals?: None Help from another person taking care of personal grooming?: A Little Help from another person toileting, which includes using toliet, bedpan, or urinal?: A Little Help from another person bathing (including washing, rinsing, drying)?: A Little Help from another person to put on and taking off regular upper body clothing?: A Little Help from another person to put on and taking off regular lower body clothing?: A Little 6 Click Score: 19    End of Session Equipment Utilized During Treatment: Gait belt;Rolling walker;Oxygen  OT Visit Diagnosis: Unsteadiness on feet (R26.81);Other abnormalities of gait and mobility (R26.89);Muscle weakness (generalized) (M62.81);Pain   Activity Tolerance Patient tolerated treatment well   Patient Left in chair;with call bell/phone within reach   Nurse Communication Mobility status        Time: 1327-1400 OT Time Calculation (min): 33 min  Charges: OT General Charges $OT Visit: 1 Visit OT Treatments $Self Care/Home Management : 23-37 mins  Ebony Hail Harold Hedge) Marsa Aris OTR/L Acute Rehabilitation Services Pager: [phone] Office: [phone]    Audie Pinto 12/17/2018, 4:31 PM

## 2018-12-17 NOTE — TOC Progression Note (Signed)
Transition of Care [facility]) - Progression Note    Patient Details  Name: Natalie Thomas MRN: [id number] Date of Birth: January 18, 1961  Transition of Care [facility]) CM/SW [place], [place] Phone Number: 12/17/2018, 3:00 PM  Clinical Narrative:    Continue to await COVID screen.   Expected Discharge Plan: Skilled Nursing Facility Barriers to Discharge: Ship broker, Continued Medical Work up  Expected Discharge Plan and Services Expected Discharge Plan: [place] In-house Referral: Clinical Social Work   Post Acute Care Choice: [place] Living arrangements for the past 2 months: [place] Expected Discharge Date: 12/17/18                 Social Determinants of Health (SDOH) Interventions    Readmission Risk Interventions Readmission Risk Prevention Plan 12/08/2018  Transportation Screening Complete  Medication Review Press photographer) Complete  PCP or Specialist appointment within 3-5 days of discharge Complete  HRI or Home Care Consult Complete  SW Recovery Care/Counseling Consult Complete  Palliative Care Screening Not [place] Complete  Some recent data might be hidden

## 2018-12-17 NOTE — Discharge Instructions (Signed)
Syndrome of Inappropriate Antidiuretic Hormone Syndrome of inappropriate antidiuretic hormone (SIADH) is a condition in which extra antidiuretic hormone (ADH) is produced in the body. ADH controls water levels in the body. When too much of the hormone is produced, it can cause problems such as low salt (sodium) levels in your blood and reduced urine production. When this happens, the body retains too much water, which can cause various symptoms. Symptoms can range from mild to severe. SIADH is most common in people who are hospitalized for another reason. What are the causes? This condition may be caused by:  Tumors.  Injury to the brain.  Infection or inflammation in the brain, such as meningitis or encephalitis.  Lung infections or diseases.  Cancer.  Major surgery.  Extreme exercise.  Stroke.  Guillain-Barr syndrome (GBS).  Medicines, including chemotherapy drugs, NSAIDs, and pain medicines.  Problems with breathing (respiratory failure).  AIDS. What increases the risk? The following factors may make you more likely to develop this condition:  Increasing age.  Recent major surgery.  Hospitalization. What are the signs or symptoms? Symptoms of this condition include:  Irritability.  Muscle cramps.  Nausea or vomiting.  Mental changes, such as memory problems, aggressiveness, confusion, or hallucinations.  Seizure.  Coma. How is this diagnosed? This condition may be diagnosed based on:  Your medical history.  A physical exam.  Blood tests.  Urine tests.  Chest X-ray or brain scan. How is this treated? Treatment for this condition focuses on relieving symptoms. Treatment may include:  Restricting fluid intake.  Receiving sterile salt water (saline) slowly through an IV.  Medicines to help remove water from the body (diuretics).  Medicines to help control sodium levels. Initial treatment for SIADH is done in a hospital, so your health care team  can monitor your condition closely. Other treatments may focus on treating the underlying cause of the condition. Follow these instructions at home:  Follow your health care provider's instructions on restricting your intake of fluids.  Take over-the-counter and prescription medicines only as told by your health care provider.  Keep all follow-up visits as told by your health care provider. This is important. Contact a health care provider if you:  Have muscle cramps.  Are nauseous or you vomit. Get help right away if you have:  A seizure.  Loss of consciousness.  Mental changes, such as memory problems, aggressiveness, confusion, or hallucinations. Summary  Syndrome of inappropriate antidiuretic hormone (SIADH) is a condition in which extra antidiuretic hormone (ADH) is produced in the body.  SIADH is most common in people who are hospitalized for another reason.  Initial treatment for SIADH is done in the hospital setting in order to monitor your condition closely.  Follow your health care provider's instructions on restricting your intake of fluids. This information is not intended to replace advice given to you by your health care provider. Make sure you discuss any questions you have with your health care provider. Document Released: 01/06/2014 Document Revised: 07/04/2017 Document Reviewed: 07/04/2017 Elsevier Interactive Patient Education  2019 [employer].

## 2018-12-18 LAB — GLUCOSE, CAPILLARY
Glucose-Capillary: 100 mg/dL — ABNORMAL HIGH (ref 70–99)
Glucose-Capillary: 122 mg/dL — ABNORMAL HIGH (ref 70–99)
Glucose-Capillary: 192 mg/dL — ABNORMAL HIGH (ref 70–99)

## 2018-12-18 MED ORDER — OXYCODONE HCL 5 MG PO TABS: 5.0000 mg | ORAL_TABLET | Freq: Every day | ORAL | 0 refills | Status: DC

## 2018-12-18 MED ORDER — SAPHRIS 5 MG SL SUBL: 5.0000 mg | SUBLINGUAL_TABLET | Freq: Two times a day (BID) | SUBLINGUAL | 0 refills | Status: DC

## 2018-12-18 MED ORDER — DIAZEPAM 2 MG PO TABS: 2.0000 mg | ORAL_TABLET | Freq: Two times a day (BID) | ORAL | 0 refills | Status: DC

## 2018-12-18 MED ORDER — LURASIDONE HCL 40 MG PO TABS: 40.0000 mg | ORAL_TABLET | Freq: Two times a day (BID) | ORAL | 0 refills | Status: DC

## 2018-12-18 NOTE — TOC Transition Note (Signed)
Transition of Care [facility]) - CM/SW Discharge Note   Patient Details  Name: Natalie Thomas MRN: [id number] Date of Birth: 1961/02/26  Transition of Care [facility]) CM/SW Contact:  Geralynn Ochs, LCSW Phone Number: 12/18/2018, 3:36 PM   Clinical Narrative:   Nurse to call report to [phone], Room 113    Final next level of care: Skilled Nursing Facility Barriers to Discharge: Barriers Resolved   Patient Goals and CMS Choice   CMS Medicare.gov Compare Post Acute Care list provided to:: Legal Guardian Choice offered to / list presented to : [facility] POA / [place]  Discharge Placement              Patient chooses bed at: [facility] Patient to be transferred to facility by: [place] Name of family member notified: [name] Patient and family notified of of transfer: 12/18/18  Discharge Plan and Services In-house Referral: Clinical Social Work   Post Acute Care Choice: [place]                               Social Determinants of Health ([place]) Interventions     Readmission Risk Interventions Readmission Risk Prevention Plan 12/08/2018  Transportation Screening Complete  Medication Review Press photographer) Complete  PCP or Specialist appointment within 3-5 days of discharge Complete  HRI or Home Care Consult Complete  SW Recovery Care/Counseling Consult Complete  Trainer Complete  Some recent data might be hidden

## 2018-12-18 NOTE — Discharge Summary (Addendum)
Discharge Summary  Natalie Thomas XFG:[id number] DOB: 04-Oct-1960  PCP: System, Pcp Not In  Admit date: 12/03/2018 Discharge date: 12/18/2018  Time spent: 35 minutes  UPDATE: Discharge delayed due to awaiting Covid-19 send out test which is negative.  Recommendations for Outpatient Follow-up:  1. Follow-up with your primary care provider 2. Take your medications as prescribed 3. Continue physical therapy 4. Fall precautions.  Discharge Diagnoses:  Active Hospital Problems   Diagnosis Date Noted   Acute hypoxemic respiratory failure ([place]) 12/03/2018    Resolved Hospital Problems  No resolved problems to display.    Discharge Condition: Stable  Diet recommendation: Resume previous diet  Vitals:   12/18/18 0748 12/18/18 0756  BP: (!) 111/58   Pulse: (!) 58   Resp: 20   Temp: 97.7 F (36.5 C)   SpO2: 97% 95%    History of present illness:   58 year old female with history of COPD on 2 L of oxygen by nasal cannula continuously, chronic diastolic CHF, hypertension, hyperlipidemia, chronic hyponatremia, polycythemia, depression and schizoaffective disorder admitted with acute on chronic respiratory failure with desaturation to 60% on room air. CXR showed bibasilar consolidation thought to be due to HCAP. ABG with chronic respiratory acidosis. Intubated on 6/10 and admitted to ICU. Started on broad-spectrum antibiotic. COVID-19 and blood cultures negative. Urine showed grew E. coli greater than 100,000 colonies.  Patient was extubated on 12/04/2018. [facility] assumed care on 12/06/2018.  Completed course of IV antibiotics.  Blood cultures negative to date no growth x6 days.  12/18/18: Patient seen and examined at her bedside.  No acute events overnight.  She has no new concerns.  COVID-19 negative.  Will discharge to SNF.  On the day of discharge, the patient was hemodynamically stable.  She will need to follow-up with her primary care provider and continue physical therapy.   She will also need to abide by fall precautions.  Hospital Course:  Active Problems:   Acute hypoxemic respiratory failure (H[facility])  Resolved acute on chronic hypoxemic and hypercarbic respiratory failure due to COPD exacerbation and possible CHF exacerbation.  -Blood and sputum cultures negative.  -COVID-19 negative.  -Currently on minimal O2 -Completed course of abx -Currently stable -At rest O2 saturation 97% on room air. -Repeat Covid-19 send out pending  Acute on chronic euvolemic hyponatremia:  -Suspecting SIADH -improved overnight with fluid restricted diet, IVF stopped -Continue fluid restriction <1200 cc/day -Continue salt tablet 1 g 3 times daily with meals -Repeat BMP on Monday  Acute metabolic encephalopathy:  -Resolved  Resolving aggression and agitation:  -Appears improved on Seroquel -Continue Seroquel \  Acute on chronic diastolic CHF:  -Echo on 9/67 with EF greater than 65% and no significant structural abnormalities.  -Sodium worsened with lasix, since stopped -Repeat BMET in AM --28.8 L net I&O since admission  E. coli UTI:  -Urine culture revealed greater than 100,000 colonies E. coli.  Sensitive to ceftriaxone which she completed -Denies dysuria on 12/17/2018  Depression/anxiety/schizoaffective disorder:  -No acute issues -Continue home Depakote, - Continue home carcinoma pain, Latuda and Paxil. Reduced Valium, gabapentin and trazodone as tolerated   Chronic pain/chronic opiate use:  -Continue with scheduled Tylenol 650 mg every 8 hours. Gabapentin and oxycodone was recently reduced as above in the setting of somnolence.  Essential hypertension:  -on Procardia, labetalol, and hydralazine at home. Metoprolol 12.5 mg twice daily-beta-1 selective.  -BP currently improved after metoprolol was increased to 25mg  BID  GERD/constipation: PPI.  Continue bowel regimen.    Code  Status: Full    Antimicrobials:             Anti-infectives  (From admission, onward)       Start        Dose/Rate  Route  Frequency  Ordered  Stop     12/05/18 1000    cefTRIAXone (ROCEPHIN) 2 g in sodium chloride 0.9 % 100 mL IVPB       2 g  200 mL/hr over 30 Minutes  Intravenous  Every 24 hours  12/05/18 0814  12/07/18 1015     12/04/18 1800    vancomycin (VANCOCIN) 1,250 mg in sodium chloride 0.9 % 250 mL IVPB  Status:  Discontinued       1,250 mg  166.7 mL/hr over 90 Minutes  Intravenous  Every 24 hours  12/03/18 1744  12/05/18 1131     12/03/18 1715    vancomycin (VANCOCIN) 1,500 mg in sodium chloride 0.9 % 500 mL IVPB       1,500 mg  250 mL/hr over 120 Minutes  Intravenous   Once  12/03/18 1709  12/03/18 2037     12/03/18 1715    ceFEPIme (MAXIPIME) 2 g in sodium chloride 0.9 % 100 mL IVPB  Status:  Discontinued       2 g  200 mL/hr over 30 Minutes  Intravenous  Every 8 hours  12/03/18 1709  12/05/18 0814           Procedures:  Intubation on 12/03/18  Extubation on 12/04/2018  Consultations:  None   Discharge Exam: BP (!) 111/58 (BP Location: Left Arm)    Pulse (!) 58    Temp 97.7 F (36.5 C) (Oral)    Resp 20    Ht 5\' 3"  (1.6 m)    Wt 99 kg    SpO2 95%    BMI 38.66 kg/m   General: 58 y.o. year-old female well-developed well-nourished in no acute distress.  Alert and interactive.  Cardiovascular: Regular rate and rhythm with no rubs or gallops.  No JVD or thyromegaly noted.    Respiratory: Clear to auscultation with no wheezes or rales.  Good inspiratory effort..  Abdomen: Obese nontender nondistended with normal bowel sounds x4 quadrants.   Psychiatry: Mood is appropriate for condition and setting  Discharge Instructions You were cared for by a hospitalist during your hospital stay. If you have any questions about your discharge medications or the care you received while you were in the hospital after you are discharged, you can call the unit and asked to speak  with the hospitalist on call if the hospitalist that took care of you is not available. Once you are discharged, your primary care physician will handle any further medical issues. Please note that NO REFILLS for any discharge medications will be authorized once you are discharged, as it is imperative that you return to your primary care physician (or establish a relationship with a primary care physician if you do not have one) for your aftercare needs so that they can reassess your need for medications and monitor your lab values.  Discharge Instructions    (HEART FAILURE PATIENTS) Call MD:  Anytime you have any of the following symptoms: 1) 3 pound weight gain in 24 hours or 5 pounds in 1 week 2) shortness of breath, with or without a dry hacking cough 3) swelling in the hands, feet or stomach 4) if you have to sleep on extra pillows at night in order to breathe.  Complete by: As directed    Activity as tolerated - No restrictions   Complete by: As directed    Activity as tolerated - No restrictions   Complete by: As directed    Call MD for:  difficulty breathing, headache or visual disturbances   Complete by: As directed    Call MD for:  difficulty breathing, headache or visual disturbances   Complete by: As directed    Call MD for:  persistant nausea and vomiting   Complete by: As directed    Call MD for:  persistant nausea and vomiting   Complete by: As directed    Call MD for:  severe uncontrolled pain   Complete by: As directed    Diet - low sodium heart healthy   Complete by: As directed    Discharge instructions   Complete by: As directed    PT/OT eval and treat. Follow up with PCP in 7-10 days after discharge.   Discharge instructions   Complete by: As directed    Follow up with PCP in 7-10 days. Daily weights.   Increase activity slowly   Complete by: As directed    Increase activity slowly   Complete by: As directed      Allergies as of 12/18/2018      Reactions    Clarithromycin Itching   Penicillins Itching   Has tolerated cefazolin before Has patient had a PCN reaction causing immediate rash, facial/tongue/throat swelling, SOB or lightheadedness with hypotension: No Has patient had a PCN reaction causing severe rash involving mucus membranes or skin necrosis: No Has patient had a PCN reaction that required hospitalization: Unknown Has patient had a PCN reaction occurring within the last 10 years: No If all of the above answers are "NO", then may proceed with Cephalosporin use.      Medication List    STOP taking these medications   albuterol 108 (90 Base) MCG/ACT inhaler Commonly known as: VENTOLIN HFA Replaced by: albuterol (2.5 MG/3ML) 0.083% nebulizer solution   dicyclomine 20 MG tablet Commonly known as: BENTYL   divalproex 250 MG DR tablet Commonly known as: DEPAKOTE   divalproex 500 MG DR tablet Commonly known as: DEPAKOTE   hydrALAZINE 100 MG tablet Commonly known as: APRESOLINE   loperamide 2 MG tablet Commonly known as: IMODIUM A-D   loratadine 10 MG tablet Commonly known as: CLARITIN   magnesium oxide 400 (241.3 Mg) MG tablet Commonly known as: MAG-OX   Melatonin 10 MG Tabs   neomycin-bacitracin-polymyxin ointment Commonly known as: NEOSPORIN   nystatin cream Commonly known as: MYCOSTATIN   oyster calcium 500 MG Tabs tablet   PARoxetine 20 MG tablet Commonly known as: PAXIL   PARoxetine 30 MG tablet Commonly known as: PAXIL   PROBIOTIC PO     TAKE these medications   acetaminophen 325 MG tablet Commonly known as: TYLENOL Take 650 mg by mouth 2 (two) times daily as needed (pain). Do not exceed 3 grams/24 hours of tylenol from all sources What changed: Another medication with the same name was removed. Continue taking this medication, and follow the directions you see here.   albuterol (2.5 MG/3ML) 0.083% nebulizer solution Commonly known as: PROVENTIL Take 3 mLs (2.5 mg total) by nebulization every 4  (four) hours as needed for wheezing or shortness of breath. Replaces: albuterol 108 (90 Base) MCG/ACT inhaler   aluminum-magnesium hydroxide-simethicone 734-193-79 MG/5ML Susp Commonly known as: MAALOX Take 30 mLs by mouth 4 (four) times daily as needed (heartburn, indigestion).  arformoterol 15 MCG/2ML Nebu Commonly known as: BROVANA Take 2 mLs (15 mcg total) by nebulization 2 (two) times daily.   aspirin 81 MG tablet Take 1 tablet (81 mg total) by mouth daily. For heart health   benzonatate 100 MG capsule Commonly known as: TESSALON Take 1 capsule (100 mg total) by mouth 3 (three) times daily as needed for cough.   Breo Ellipta 100-25 MCG/INH Aepb Generic drug: fluticasone furoate-vilanterol Inhale 1 puff into the lungs daily.   budesonide 0.5 MG/2ML nebulizer solution Commonly known as: PULMICORT Take 2 mLs (0.5 mg total) by nebulization 2 (two) times daily.   buPROPion 150 MG 24 hr tablet Commonly known as: WELLBUTRIN XL Take 150 mg by mouth daily. What changed: Another medication with the same name was removed. Continue taking this medication, and follow the directions you see here.   calcium carbonate 500 MG chewable tablet Commonly known as: TUMS - dosed in mg elemental calcium Chew 1 tablet (200 mg of elemental calcium total) by mouth 2 (two) times daily as needed for indigestion or heartburn.   diazepam 2 MG tablet Commonly known as: VALIUM Take 1 tablet (2 mg total) by mouth 2 (two) times a day. What changed:   medication strength  how much to take   docusate sodium 100 MG capsule Commonly known as: COLACE Take 100 mg by mouth 2 (two) times daily as needed (constipation).   ferrous sulfate 325 (65 FE) MG tablet Take 325 mg by mouth daily.   fluticasone 50 MCG/ACT nasal spray Commonly known as: FLONASE Place 2 sprays into both nostrils 2 (two) times daily.   furosemide 20 MG tablet Commonly known as: LASIX Take 20 mg by mouth 2 (two) times daily.     gabapentin 100 MG capsule Commonly known as: NEURONTIN Take 1 capsule (100 mg total) by mouth at bedtime. What changed:   medication strength  how much to take  when to take this   hydrOXYzine 25 MG tablet Commonly known as: ATARAX/VISTARIL Take 1 tablet (25 mg total) by mouth 3 (three) times daily as needed for anxiety.   ipratropium-albuterol 0.5-2.5 (3) MG/3ML Soln Commonly known as: DUONEB Take 3 mLs by nebulization See admin instructions. Inhale one vial via hand held nebulizer three times daily, may also use every 8 hours as needed for shortness of breath   lurasidone 40 MG Tabs tablet Commonly known as: LATUDA Take 1 tablet (40 mg total) by mouth 2 (two) times a day. What changed: Another medication with the same name was removed. Continue taking this medication, and follow the directions you see here.   metoprolol tartrate 25 MG tablet Commonly known as: LOPRESSOR Take 1 tablet (25 mg total) by mouth 2 (two) times daily.   nicotine 21 mg/24hr patch Commonly known as: NICODERM CQ - dosed in mg/24 hours Place 1 patch (21 mg total) onto the skin daily.   NIFEdipine 90 MG 24 hr tablet Commonly known as: PROCARDIA XL/NIFEDICAL-XL Take 90 mg by mouth daily.   omeprazole 20 MG capsule Commonly known as: PRILOSEC Take 1 capsule (20 mg total) by mouth 2 (two) times daily before a meal. For acid reflux. What changed: when to take this   oxyCODONE 5 MG immediate release tablet Commonly known as: Oxy IR/ROXICODONE Take 1 tablet (5 mg total) by mouth daily.   polyethylene glycol 17 g packet Commonly known as: MiraLax Take 17 g by mouth daily. What changed:   when to take this  reasons to take this  pravastatin 20 MG tablet Commonly known as: PRAVACHOL Take 20 mg by mouth daily.   predniSONE 20 MG tablet Commonly known as: DELTASONE Take 2 tablets (40 mg total) by mouth daily for 3 days, THEN 1 tablet (20 mg total) daily for 3 days, THEN 0.5 tablets (10 mg  total) daily for 3 days. Start taking on: December 11, 2018   QUEtiapine 50 MG tablet Commonly known as: SEROQUEL Take 1 tablet (50 mg total) by mouth at bedtime.   QUEtiapine 25 MG tablet Commonly known as: SEROQUEL Take 1 tablet (25 mg total) by mouth daily.   Robafen 100 MG/5ML syrup Generic drug: guaifenesin Take 200 mg by mouth 3 (three) times daily as needed for cough or congestion.   Saphris 5 MG Subl 24 hr tablet Generic drug: asenapine Place 1 tablet (5 mg total) under the tongue 2 (two) times daily.   sodium chloride 1 g tablet Take 1 tablet (1 g total) by mouth 3 (three) times daily with meals. What changed: when to take this   traZODone 50 MG tablet Commonly known as: DESYREL Take 1 tablet (50 mg total) by mouth at bedtime. What changed:   medication strength  how much to take   valproic acid 250 MG capsule Commonly known as: DEPAKENE Take 2 capsules (500 mg total) by mouth 2 (two) times daily.            Durable Medical Equipment  (From admission, onward)         Start     Ordered   12/18/18 0522  For home use only DME 3 n 1  Once    Comments: 3 n 1 bedside commode   12/18/18 0521         Allergies  Allergen Reactions   Clarithromycin Itching   Penicillins Itching    Has tolerated cefazolin before  Has patient had a PCN reaction causing immediate rash, facial/tongue/throat swelling, SOB or lightheadedness with hypotension: No Has patient had a PCN reaction causing severe rash involving mucus membranes or skin necrosis: No Has patient had a PCN reaction that required hospitalization: Unknown Has patient had a PCN reaction occurring within the last 10 years: No If all of the above answers are "NO", then may proceed with Cephalosporin use.     Contact information for follow-up providers    [place]. Call in 1 day(s).   Why: Please call for post hospital follow-up appointment. Contact information: [address] [place] [address] [phone]           Contact information for after-discharge care    Destination    [facility] Preferred SNF .   Service: Skilled Nursing Contact information: [address] [place] [phone]                   The results of significant diagnostics from this hospitalization (including imaging, microbiology, ancillary and laboratory) are listed below for reference.    Significant Diagnostic Studies: Dg Chest Port 1 View  Result Date: 12/05/2018 CLINICAL DATA:  Acute respiratory failure EXAM: PORTABLE CHEST 1 VIEW COMPARISON:  12/04/2018 FINDINGS: Cardiac shadow is enlarged but stable. Aortic calcifications are again seen. Bilateral pleural effusions with underlying atelectatic changes are again seen and stable. Endotracheal tube and gastric catheter have been removed in the interval. IMPRESSION: Stable effusions and atelectatic changes. No new focal abnormality is noted. Electronically Signed   By: Linus Mako.D.  On: 12/05/2018 07:36   Dg Chest Port 1 View  Result Date: 12/04/2018 CLINICAL DATA:  respiratory failurerespiratory failure EXAM: PORTABLE CHEST 1 VIEW COMPARISON:  12/03/2018 FINDINGS: Endotracheal tube appears in good position. NG tube extends into the stomach. Stable cardiomegaly. There is bibasilar atelectasis and bibasilar pleural effusions. Upper lungs are clear. IMPRESSION: 1. No significant change. 2. Stable support apparatus. 3. bilateral pleural effusions and bibasilar atelectasis Electronically Signed   By: Suzy Bouchard M.D.   On: 12/04/2018 08:42   Dg Chest Portable 1 View  Result Date: 12/03/2018 CLINICAL DATA:  Hypoxia EXAM: PORTABLE CHEST 1 VIEW COMPARISON:  Nov 08, 2018 FINDINGS: Endotracheal tube tip is 1.2 cm above the carina. Nasogastric tube tip and side port are below the diaphragm. No pneumothorax. There is pleural effusion with airspace  consolidation in much of the right middle and lower lobes. There is consolidation in the medial left base. There is cardiomegaly with pulmonary vascularity normal. There is aortic atherosclerosis. No adenopathy. No bone lesions. IMPRESSION: Tube positions as described without pneumothorax. Note that the endotracheal tube tip is near the carina; it may be prudent to consider withdrawing endotracheal tube 2 to 2.5 cm. Right pleural effusion with consolidation throughout much of the right middle and lower lobes. There is also consolidation in the medial left base. There is stable cardiomegaly. Aortic Atherosclerosis (ICD10-I70.0). Electronically Signed   By: Lowella Grip III M.D.   On: 12/03/2018 15:47   Dg Chest Port 1 View  Result Date: 12/03/2018 CLINICAL DATA:  Oxygen desaturation. EXAM: PORTABLE CHEST 1 VIEW COMPARISON:  Single-view of the chest 11/08/2018. PA and lateral chest 07/04/2018 and 06/09/2018. CT chest 05/14/2018. FINDINGS: The lungs are emphysematous. There is marked cardiomegaly. Mild interstitial edema is present. Hazy opacities in the lung bases likely represent small effusions. No pneumothorax. Atherosclerosis noted. No acute bony abnormality. IMPRESSION: Cardiomegaly with mild interstitial edema and small pleural effusions. Atherosclerosis. Emphysema. Electronically Signed   By: Inge Rise M.D.   On: 12/03/2018 10:35    Microbiology: Recent Results (from the past 240 hour(s))  Novel Coronavirus, NAA (hospital order; send-out to ref lab)     Status: None   Collection Time: 12/11/18  4:02 PM   Specimen: Nasopharyngeal Swab; Respiratory  Result Value Ref Range Status   SARS-CoV-2, NAA NOT DETECTED NOT DETECTED Final    Comment: (NOTE) This test was developed and its performance characteristics determined by [employer]. This test has not been FDA cleared or approved. This test has been authorized by FDA under an Emergency Use Authorization (EUA). This test is  only authorized for the duration of time the declaration that circumstances exist justifying the authorization of the emergency use of in vitro diagnostic tests for detection of SARS-CoV-2 virus and/or diagnosis of COVID-19 infection under section 564(b)(1) of the Act, 21 U.S.C. 557DUK-0(U)(5), unless the authorization is terminated or revoked sooner. When diagnostic testing is negative, the possibility of a false negative result should be considered in the context of a patient's recent exposures and the presence of clinical signs and symptoms consistent with COVID-19. An individual without symptoms of COVID-19 and who is not shedding SARS-CoV-2 virus would expect to have a negative (not detected) result in this assay. Performed  At: [facility] [address] [place], [place] [id number] Rush Farmer MD EG:[id number]    [place]  Final    Comment: Performed at [facility], [place] [address] [place], [place] [address]  Novel Coronavirus, NAA (hospital order;  send-out to ref lab)     Status: None   Collection Time: 12/16/18  3:25 PM   Specimen: Nasopharyngeal Swab; Respiratory  Result Value Ref Range Status   SARS-CoV-2, NAA NOT DETECTED NOT DETECTED Final    Comment: (NOTE) This test was developed and its performance characteristics determined by [employer]. This test has not been FDA cleared or approved. This test has been authorized by FDA under an Emergency Use Authorization (EUA). This test is only authorized for the duration of time the declaration that circumstances exist justifying the authorization of the emergency use of in vitro diagnostic tests for detection of SARS-CoV-2 virus and/or diagnosis of COVID-19 infection under section 564(b)(1) of the Act, 21 U.S.C. 326ZTI-4(P)(8), unless the authorization is terminated or revoked sooner. When diagnostic testing is negative, the possibility of a false negative result should be  considered in the context of a patient's recent exposures and the presence of clinical signs and symptoms consistent with COVID-19. An individual without symptoms of COVID-19 and who is not shedding SARS-CoV-2 virus would expect to have a negative (not detected) result in this assay. Performed  At: [facility] [address] [place], [place] [id number] Rush Farmer MD KN:[id number]    [place]  Final    Comment: Performed at [facility], [place] [address] [place], [place] [address]     Labs: Basic Metabolic Panel: Recent Labs  Lab 12/14/18 [id number] 12/14/18 1706 12/15/18 0843 12/16/18 0831 12/17/18 0619  NA 126* 123* 130* 131* 130*  K 4.7 4.5 4.5 4.9 4.1  CL 89* 88* 92* 95* 93*  CO2 27 25 29 28 29   GLUCOSE 161* 159* 125* 120* 123*  BUN 15 19 16 18 19   CREATININE 0.53 0.47 0.49 0.53 0.64  CALCIUM 8.2* 8.2* 8.5* 8.3* 8.6*   Liver Function Tests: Recent Labs  Lab 12/17/18 0619  AST 11*  ALT 18  ALKPHOS 41  BILITOT 0.5  PROT 5.6*  ALBUMIN 2.7*   No results for input(s): LIPASE, AMYLASE in the last 168 hours. No results for input(s): AMMONIA in the last 168 hours. CBC: No results for input(s): WBC, NEUTROABS, HGB, HCT, MCV, PLT in the last 168 hours. Cardiac Enzymes: No results for input(s): CKTOTAL, CKMB, CKMBINDEX, TROPONINI in the last 168 hours. BNP: BNP (last 3 results) Recent Labs    06/17/18 2133 12/03/18 0957 12/06/18 0911  BNP 125.4* 296.6* 132.3*    ProBNP (last 3 results) No results for input(s): PROBNP in the last 8760 hours.  CBG: Recent Labs  Lab 12/17/18 0729 12/17/18 1159 12/17/18 1614 12/17/18 2103 12/18/18 0746  GLUCAP 90 131* 198* 134* 100*       Signed:  Kayleen Memos, MD [facility] 12/18/2018, 2:52 PM

## 2018-12-18 NOTE — Progress Notes (Signed)
Physical Therapy Treatment Patient Details Name: Natalie Thomas MRN: [id number] DOB: September 04, 1960 Today's Date: 12/18/2018    History of Present Illness Pt adm 12/03/18 with hypoxic respiratory failure due to copd exacerbation and PNA. PMH - rt hip fx, copd, schizoaffective disorder, htn, dm, chf, lumbar disc disease, pelvic fx    PT Comments    Pt admitted with above diagnosis. Pt currently with functional limitations due to balance and endurance deficits. Pt progressing well.  Met 3/3 goals. Goals revised.  Continue PT.  Pt will benefit from skilled PT to increase their independence and safety with mobility to allow discharge to the venue listed below.     Follow Up Recommendations  SNF     Equipment Recommendations  None recommended by PT    Recommendations for Other Services       Precautions / Restrictions Precautions Precautions: Fall;Other (comment) Precaution Comments: monitor O2 sats Restrictions Weight Bearing Restrictions: No    Mobility  Bed Mobility Overal bed mobility: Needs Assistance Bed Mobility: Supine to Sit     Supine to sit: Supervision;HOB elevated Sit to supine: Supervision   General bed mobility comments: supervision for safety  Transfers Overall transfer level: Needs assistance Equipment used: Rolling walker (2 wheeled) Transfers: Sit to/from Stand Sit to Stand: Min guard Stand pivot transfers: Min guard       General transfer comment: [email or web address] for safety  Ambulation/Gait Ambulation/Gait assistance: Supervision Gait Distance (Feet): 125 Feet Assistive device: Rolling walker (2 wheeled) Gait Pattern/deviations: Step-through pattern;Trunk flexed Gait velocity: decr Gait velocity interpretation: <1.31 ft/sec, indicative of household ambulator General Gait Details: self reported weakness in bil LEs, DOE 2/4 with lowest O2 sat 90% on 2 L O2 [place]. Pt with incr distance and not needing physical assist. Good safety with RW.    Stairs              Wheelchair Mobility    Modified Rankin (Stroke Patients Only)       Balance Overall balance assessment: Needs assistance Sitting-balance support: Feet supported;Bilateral upper extremity supported Sitting balance-Leahy Scale: Fair     Standing balance support: Bilateral upper extremity supported Standing balance-Leahy Scale: Poor Standing balance comment: needs UE support in standing.                             Cognition Arousal/Alertness: Awake/alert Behavior During Therapy: WFL for tasks assessed/performed Overall Cognitive Status: No family/caregiver present to determine baseline cognitive functioning                                 General Comments: Pt seems to have some STM deficits as she could not tell me how much O2 she is on normally.      Exercises      General Comments        Pertinent Vitals/Pain Pain Assessment: No/denies pain    Home Living                      Prior Function            PT Goals (current goals can now be found in the care plan section) Acute Rehab PT Goals Patient Stated Goal: to not have to go to rehab PT Goal Formulation: With patient Time For Goal Achievement: 01/01/19 Potential to Achieve Goals: Fair Progress towards PT goals: Progressing toward goals    Frequency  Min 2X/week      PT Plan Current plan remains appropriate    Co-evaluation              AM-PAC PT "6 Clicks" Mobility   Outcome Measure  Help needed turning from your back to your side while in a flat bed without using bedrails?: A Little Help needed moving from lying on your back to sitting on the side of a flat bed without using bedrails?: A Little Help needed moving to and from a bed to a chair (including a wheelchair)?: A Little Help needed standing up from a chair using your arms (e.g., wheelchair or bedside chair)?: A Little Help needed to walk in hospital room?: None Help needed climbing  3-5 steps with a railing? : A Little 6 Click Score: 19    End of Session Equipment Utilized During Treatment: Gait belt;Oxygen(2 L O2 [place]) Activity Tolerance: Patient limited by fatigue Patient left: in chair;with call bell/phone within reach;with chair alarm set Nurse Communication: Mobility status PT Visit Diagnosis: Unsteadiness on feet (R26.81);Muscle weakness (generalized) (M62.81);Other abnormalities of gait and mobility (R26.89)     Time: 2258-3462 PT Time Calculation (min) (ACUTE ONLY): 19 min  Charges:  $Gait Training: 8-22 mins                     [place] Pager:  [phone]  Office:  [place] 12/18/2018, 1:22 PM

## 2018-12-19 NOTE — Care Management Important Message (Signed)
Important Message  Patient Details  Name: Natalie Thomas MRN: [id number] Date of Birth: 10/06/1960   Medicare Important Message Given:  Yes     Orbie Pyo 12/19/2018, 2:57 PM

## 2019-01-19 NOTE — ED Provider Notes (Cosign Needed)
[facility] Provider Note   CSN: [id number] Arrival date & time: 12/03/18  0945     History   Chief Complaint Chief Complaint  Patient presents with  . Shortness of Breath    HPI Natalie Thomas is a 58 y.o. female.     HPI Patient presents to the emergency department with difficulty breathing and low pulse oximetry.  Patient lives in a nursing facility and EMS was called due to low oxygen saturations and difficulty breathing.  Patient has chronic lung difficulties.  The patient not able to give me much history but does state that she is short of breath.  She states that this started several days ago.  The patient denies chest pain, , headache,blurred vision, neck pain, fever, cough, weakness, numbness, dizziness, anorexia, edema, abdominal pain, nausea, vomiting, diarrhea, rash, back pain, dysuria, hematemesis, bloody stool, near syncope, or syncope. Past Medical History:  Diagnosis Date  . Acute on chronic respiratory failure with hypoxia and hypercapnia ([place]) 10/07/2017  . Anemia 10/16/2017  . Anxiety and depression   . CHF (congestive heart failure) ([place])   . Chronic hyponatremia 04/08/2007   Qualifier: Diagnosis of  By: Marca Ancona RMA, Lucy    . Chronic respiratory failure with hypoxia ([place]) 10/07/2017  . Closed comminuted intertrochanteric fracture of right femur ([place]) 11/07/2017  . COPD 04/08/2007   Qualifier: Diagnosis of  By: Marca Ancona RMA, Lucy    . Cough 05/08/2011  . Depression   . Diabetes mellitus, type 2 ([place])    pt denies but states that she has been treated for DM  . Dyspnea 10/16/2017  . Dysuria 04/08/2007   Qualifier: Diagnosis of  By: Reatha Armour, Lucy    . Essential hypertension 04/08/2007   Qualifier: Diagnosis of  By: Marca Ancona RMA, Lucy    . GERD (gastroesophageal reflux disease)   . HIP PAIN, LEFT 12/12/2007   Qualifier: Diagnosis of  By: Loanne Drilling MD, Jacelyn Pi   . Hyperlipidemia 10/07/2017  . Hypertension   . Hyponatremia 07/27/2011  .  Incontinence of urine 08/14/2011  . Lumbar disc disease 04/09/2011  . MENOPAUSE, PREMATURE 04/08/2007   Qualifier: Diagnosis of  By: Marca Ancona RMA, Lucy    . 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 Osteoporosis   . Osteoporosis 04/08/2007   Qualifier: Diagnosis of  By: Reatha Armour, Lucy    . Polycythemia secondary to smoking 04/09/2011  . S/P right hip fracture 11/07/2017  . Schizoaffective disorder, bipolar type ([place]) 07/05/2011    Patient Active Problem List   Diagnosis Date Noted  . Acute hypoxemic respiratory failure ([place]) 12/03/2018  . Irritability 11/09/2018  . Aggressive behavior 11/09/2018  . Agitation   . S/P right hip fracture 11/07/2017  . Closed comminuted intertrochanteric fracture of right femur ([place]) 11/07/2017  . Anxiety and depression   . Dyspnea 10/16/2017  . Anemia 10/16/2017  . COPD exacerbation ([place]) 10/07/2017  . Hyperlipidemia 10/07/2017  . Chronic respiratory failure with hypoxia ([place]) 10/07/2017  . Acute on chronic respiratory failure with hypoxia and hypercapnia ([place]) 10/07/2017  . Incontinence of urine 08/14/2011  . Hyponatremia 07/27/2011  . Schizoaffective disorder, bipolar type ([place]) 07/05/2011  . Cough 05/08/2011  . Encounter for long-term (current) use of other medications 04/09/2011  . Lumbar disc disease 04/09/2011  . Polycythemia secondary to smoking 04/09/2011  . HIP PAIN, LEFT 12/12/2007  . 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 DIABETES MELLITUS, TYPE II  04/08/2007  . MENOPAUSE, PREMATURE 04/08/2007  . Chronic hyponatremia 04/08/2007  . Schizoaffective disorder ([place]) 04/08/2007  . DEPRESSION 04/08/2007  . Essential hypertension 04/08/2007  . COPD 04/08/2007  . GERD 04/08/2007  . Osteoporosis 04/08/2007  . DYSURIA 04/08/2007    Past Surgical History:  Procedure Laterality Date  . EYE SURGERY     on left  eye, pt states that she sees bad out of the right eye and needs surgery there  .  INTRAMEDULLARY (IM) NAIL INTERTROCHANTERIC Right 11/13/2017   Procedure: INTRAMEDULLARY (IM) NAIL INTERTROCHANTRIC;  Surgeon: Shona Needles, MD;  Location: [facility];  Service: Orthopedics;  Laterality: Right;  . PELVIC FRACTURE SURGERY       OB History   No obstetric history on file.      Home Medications    Prior to Admission medications   Medication Sig Start Date End Date Taking? Authorizing Provider  acetaminophen (TYLENOL) 325 MG tablet Take 650 mg by mouth 2 (two) times daily as needed (pain). Do not exceed 3 grams/24 hours of tylenol from all sources   Yes [provider]  aluminum-magnesium hydroxide-simethicone (MAALOX) 200-200-20 MG/5ML SUSP Take 30 mLs by mouth 4 (four) times daily as needed (heartburn, indigestion).   Yes [provider]  aspirin 81 MG tablet Take 1 tablet (81 mg total) by mouth daily. For heart health 08/06/11  Yes Greig Castilla, FNP  buPROPion (WELLBUTRIN XL) 150 MG 24 hr tablet Take 150 mg by mouth daily.   Yes [provider]  docusate sodium (COLACE) 100 MG capsule Take 100 mg by mouth 2 (two) times daily as needed (constipation).    Yes [provider]  ferrous sulfate 325 (65 FE) MG tablet Take 325 mg by mouth daily.    Yes [provider]  fluticasone (FLONASE) 50 MCG/ACT nasal spray Place 2 sprays into both nostrils 2 (two) times daily.   Yes [provider]  fluticasone furoate-vilanterol (BREO ELLIPTA) 100-25 MCG/INH AEPB Inhale 1 puff into the lungs daily.   Yes [provider]  furosemide (LASIX) 20 MG tablet Take 20 mg by mouth 2 (two) times daily.   Yes [provider]  guaifenesin (ROBAFEN) 100 MG/5ML syrup Take 200 mg by mouth 3 (three) times daily as needed for cough or congestion.   Yes [provider]  hydrOXYzine (ATARAX/VISTARIL) 25 MG tablet Take 1 tablet (25 mg total) by mouth 3 (three) times daily as needed for anxiety. 10/19/17  Yes Aline August, MD   ipratropium-albuterol (DUONEB) 0.5-2.5 (3) MG/3ML SOLN Take 3 mLs by nebulization See admin instructions. Inhale one vial via hand held nebulizer three times daily, may also use every 8 hours as needed for shortness of breath   Yes [provider]  NIFEdipine (PROCARDIA XL/ADALAT-CC) 90 MG 24 hr tablet Take 90 mg by mouth daily.   Yes [provider]  omeprazole (PRILOSEC) 20 MG capsule Take 1 capsule (20 mg total) by mouth 2 (two) times daily before a meal. For acid reflux. Patient taking differently: Take 20 mg by mouth daily. For acid reflux. 10/19/17  Yes Aline August, MD  pravastatin (PRAVACHOL) 20 MG tablet Take 20 mg by mouth daily.   Yes [provider]  albuterol (PROVENTIL) (2.5 MG/3ML) 0.083% nebulizer solution Take 3 mLs (2.5 mg total) by nebulization every 4 (four) hours as needed for wheezing or shortness of breath. 12/11/18   Swayze, Ava, DO  arformoterol (BROVANA) 15 MCG/2ML NEBU Take 2 mLs (15 mcg total)  by nebulization 2 (two) times daily. 12/11/18   Swayze, Ava, DO  asenapine (SAPHRIS) 5 MG SUBL 24 hr tablet Place 1 tablet (5 mg total) under the tongue 2 (two) times daily. 12/18/18   Kayleen Memos, DO  benzonatate (TESSALON) 100 MG capsule Take 1 capsule (100 mg total) by mouth 3 (three) times daily as needed for cough. 12/11/18   Swayze, Ava, DO  budesonide (PULMICORT) 0.5 MG/2ML nebulizer solution Take 2 mLs (0.5 mg total) by nebulization 2 (two) times daily. 12/11/18   Swayze, Ava, DO  calcium carbonate (TUMS - DOSED IN MG ELEMENTAL CALCIUM) 500 MG chewable tablet Chew 1 tablet (200 mg of elemental calcium total) by mouth 2 (two) times daily as needed for indigestion or heartburn. 12/11/18   Swayze, Ava, DO  diazepam (VALIUM) 2 MG tablet Take 1 tablet (2 mg total) by mouth 2 (two) times a day. 12/18/18   Kayleen Memos, DO  gabapentin (NEURONTIN) 100 MG capsule Take 1 capsule (100 mg total) by mouth at bedtime. 12/11/18   Swayze, Ava, DO  lurasidone (LATUDA)  40 MG TABS tablet Take 1 tablet (40 mg total) by mouth 2 (two) times a day. 12/18/18   Kayleen Memos, DO  metoprolol tartrate (LOPRESSOR) 25 MG tablet Take 1 tablet (25 mg total) by mouth 2 (two) times daily. 12/17/18   Kayleen Memos, DO  nicotine (NICODERM CQ - DOSED IN MG/24 HOURS) 21 mg/24hr patch Place 1 patch (21 mg total) onto the skin daily. 12/12/18   Swayze, Ava, DO  oxyCODONE (OXY IR/ROXICODONE) 5 MG immediate release tablet Take 1 tablet (5 mg total) by mouth daily. 12/18/18   Kayleen Memos, DO  polyethylene glycol (MIRALAX) 17 g packet Take 17 g by mouth daily. 12/11/18   Swayze, Ava, DO  QUEtiapine (SEROQUEL) 25 MG tablet Take 1 tablet (25 mg total) by mouth daily. 12/12/18   Swayze, Ava, DO  QUEtiapine (SEROQUEL) 50 MG tablet Take 1 tablet (50 mg total) by mouth at bedtime. 12/11/18   Swayze, Ava, DO  sodium chloride 1 g tablet Take 1 tablet (1 g total) by mouth 3 (three) times daily with meals. 12/17/18   Kayleen Memos, DO  traZODone (DESYREL) 50 MG tablet Take 1 tablet (50 mg total) by mouth at bedtime. 12/11/18   Swayze, Ava, DO  valproic acid (DEPAKENE) 250 MG capsule Take 2 capsules (500 mg total) by mouth 2 (two) times daily. 12/11/18   Swayze, Ava, DO    Family History No family history on file.  Social History Social History   Tobacco Use  . Smoking status: Current Every Day Smoker    Packs/day: 0.25    Types: Cigarettes  . Smokeless tobacco: Never Used  Substance Use Topics  . Alcohol use: No  . Drug use: No     Allergies   Clarithromycin and Penicillins   Review of Systems Review of Systems All other systems negative except as documented in the HPI. All pertinent positives and negatives as reviewed in the HPI.  Physical Exam Updated Vital Signs BP (!) 149/66 (BP Location: Left Arm)   Pulse 65   Temp (!) 97.5 F (36.4 C) (Oral)   Resp (!) 22   Ht 5\' 3"  (1.6 m)   Wt 99 kg   SpO2 99%   BMI 38.66 kg/m   Physical Exam Vitals signs and nursing note  reviewed.  Constitutional:      General: She is not in acute distress.  Appearance: She is well-developed.  HENT:     Head: Normocephalic and atraumatic.  Eyes:     Pupils: Pupils are equal, round, and reactive to light.  Neck:     Musculoskeletal: Normal range of motion and neck supple.  Cardiovascular:     Rate and Rhythm: Normal rate and regular rhythm.     Heart sounds: Normal heart sounds. No murmur. No friction rub. No gallop.   Pulmonary:     Effort: Pulmonary effort is normal. No respiratory distress.     Breath sounds: Examination of the right-upper field reveals wheezing. Examination of the left-upper field reveals wheezing. Examination of the right-middle field reveals wheezing. Examination of the left-middle field reveals wheezing. Examination of the right-lower field reveals rales. Examination of the left-lower field reveals rales. Wheezing and rales present.  Abdominal:     General: Bowel sounds are normal. There is no distension.     Palpations: Abdomen is soft.     Tenderness: There is no abdominal tenderness.  Skin:    General: Skin is warm and dry.     Capillary Refill: Capillary refill takes less than 2 seconds.     Findings: No erythema or rash.  Neurological:     Mental Status: She is alert and oriented to person, place, and time.     Motor: No abnormal muscle tone.     Coordination: Coordination normal.  Psychiatric:        Behavior: Behavior normal.      ED Treatments / Results  Labs (all labs ordered are listed, but only abnormal results are displayed) Labs Reviewed  URINE CULTURE - Abnormal; Notable for the following components:      Result Value   Culture >=100,000 COLONIES/mL ESCHERICHIA COLI (*)    Organism ID, Bacteria ESCHERICHIA COLI (*)    All other components within normal limits  MRSA PCR SCREENING - Abnormal; Notable for the following components:   MRSA by PCR POSITIVE (*)    All other components within normal limits  COMPREHENSIVE  METABOLIC PANEL - Abnormal; Notable for the following components:   Sodium 118 (*)    Chloride 78 (*)    Glucose, Bld 135 (*)    BUN 5 (*)    Calcium 8.3 (*)    Total Protein 6.1 (*)    Albumin 2.6 (*)    All other components within normal limits  CBC WITH DIFFERENTIAL/PLATELET - Abnormal; Notable for the following components:   Lymphs Abs 0.6 (*)    Abs Immature Granulocytes 0.10 (*)    All other components within normal limits  BRAIN NATRIURETIC PEPTIDE - Abnormal; Notable for the following components:   B Natriuretic Peptide 296.6 (*)    All other components within normal limits  URINALYSIS, ROUTINE W REFLEX MICROSCOPIC - Abnormal; Notable for the following components:   Leukocytes,Ua TRACE (*)    Bacteria, UA RARE (*)    All other components within normal limits  RAPID URINE DRUG SCREEN, HOSP PERFORMED - Abnormal; Notable for the following components:   Benzodiazepines POSITIVE (*)    All other components within normal limits  VALPROIC ACID LEVEL - Abnormal; Notable for the following components:   Valproic Acid Lvl 35 (*)    All other components within normal limits  CBC - Abnormal; Notable for the following components:   Platelets 123 (*)    All other components within normal limits  BASIC METABOLIC PANEL - Abnormal; Notable for the following components:   Sodium 122 (*)  Chloride 84 (*)    Calcium 8.1 (*)    All other components within normal limits  BLOOD GAS, ARTERIAL - Abnormal; Notable for the following components:   pCO2 arterial 59.9 (*)    pO2, Arterial 67.7 (*)    Bicarbonate 35.2 (*)    Acid-Base Excess 10.0 (*)    All other components within normal limits  MAGNESIUM - Abnormal; Notable for the following components:   Magnesium 1.5 (*)    All other components within normal limits  OSMOLALITY, URINE - Abnormal; Notable for the following components:   Osmolality, Ur 250 (*)    All other components within normal limits  OSMOLALITY - Abnormal; Notable for  the following components:   Osmolality 247 (*)    All other components within normal limits  GLUCOSE, CAPILLARY - Abnormal; Notable for the following components:   Glucose-Capillary 68 (*)    All other components within normal limits  GLUCOSE, CAPILLARY - Abnormal; Notable for the following components:   Glucose-Capillary 123 (*)    All other components within normal limits  GLUCOSE, CAPILLARY - Abnormal; Notable for the following components:   Glucose-Capillary 119 (*)    All other components within normal limits  GLUCOSE, CAPILLARY - Abnormal; Notable for the following components:   Glucose-Capillary 65 (*)    All other components within normal limits  GLUCOSE, CAPILLARY - Abnormal; Notable for the following components:   Glucose-Capillary 102 (*)    All other components within normal limits  BASIC METABOLIC PANEL - Abnormal; Notable for the following components:   Sodium 129 (*)    Potassium 3.2 (*)    Chloride 84 (*)    CO2 35 (*)    BUN 5 (*)    Creatinine, Ser 0.36 (*)    Calcium 8.3 (*)    All other components within normal limits  GLUCOSE, CAPILLARY - Abnormal; Notable for the following components:   Glucose-Capillary 102 (*)    All other components within normal limits  GLUCOSE, CAPILLARY - Abnormal; Notable for the following components:   Glucose-Capillary 112 (*)    All other components within normal limits  GLUCOSE, CAPILLARY - Abnormal; Notable for the following components:   Glucose-Capillary 140 (*)    All other components within normal limits  GLUCOSE, CAPILLARY - Abnormal; Notable for the following components:   Glucose-Capillary 191 (*)    All other components within normal limits  GLUCOSE, CAPILLARY - Abnormal; Notable for the following components:   Glucose-Capillary 172 (*)    All other components within normal limits  BRAIN NATRIURETIC PEPTIDE - Abnormal; Notable for the following components:   B Natriuretic Peptide 132.3 (*)    All other components  within normal limits  BASIC METABOLIC PANEL - Abnormal; Notable for the following components:   Sodium 129 (*)    Chloride 82 (*)    CO2 34 (*)    Glucose, Bld 131 (*)    All other components within normal limits  GLUCOSE, CAPILLARY - Abnormal; Notable for the following components:   Glucose-Capillary 113 (*)    All other components within normal limits  BLOOD GAS, ARTERIAL - Abnormal; Notable for the following components:   pH, Arterial 7.454 (*)    pCO2 arterial 62.8 (*)    pO2, Arterial 48.6 (*)    Bicarbonate 43.4 (*)    Acid-Base Excess 18.0 (*)    All other components within normal limits  GLUCOSE, CAPILLARY - Abnormal; Notable for the following components:   Glucose-Capillary  140 (*)    All other components within normal limits  TSH - Abnormal; Notable for the following components:   TSH 0.241 (*)    All other components within normal limits  T4, FREE - Abnormal; Notable for the following components:   Free T4 1.15 (*)    All other components within normal limits  BASIC METABOLIC PANEL - Abnormal; Notable for the following components:   Sodium 128 (*)    Chloride 78 (*)    CO2 37 (*)    Glucose, Bld 196 (*)    All other components within normal limits  GLUCOSE, CAPILLARY - Abnormal; Notable for the following components:   Glucose-Capillary 181 (*)    All other components within normal limits  GLUCOSE, CAPILLARY - Abnormal; Notable for the following components:   Glucose-Capillary 196 (*)    All other components within normal limits  GLUCOSE, CAPILLARY - Abnormal; Notable for the following components:   Glucose-Capillary 204 (*)    All other components within normal limits  GLUCOSE, CAPILLARY - Abnormal; Notable for the following components:   Glucose-Capillary 199 (*)    All other components within normal limits  BASIC METABOLIC PANEL - Abnormal; Notable for the following components:   Sodium 123 (*)    Chloride 76 (*)    CO2 34 (*)    Glucose, Bld 165 (*)     BUN 21 (*)    Calcium 8.7 (*)    All other components within normal limits  GLUCOSE, CAPILLARY - Abnormal; Notable for the following components:   Glucose-Capillary 194 (*)    All other components within normal limits  GLUCOSE, CAPILLARY - Abnormal; Notable for the following components:   Glucose-Capillary 200 (*)    All other components within normal limits  GLUCOSE, CAPILLARY - Abnormal; Notable for the following components:   Glucose-Capillary 261 (*)    All other components within normal limits  GLUCOSE, CAPILLARY - Abnormal; Notable for the following components:   Glucose-Capillary 250 (*)    All other components within normal limits  GLUCOSE, CAPILLARY - Abnormal; Notable for the following components:   Glucose-Capillary 175 (*)    All other components within normal limits  GLUCOSE, CAPILLARY - Abnormal; Notable for the following components:   Glucose-Capillary 152 (*)    All other components within normal limits  GLUCOSE, CAPILLARY - Abnormal; Notable for the following components:   Glucose-Capillary 261 (*)    All other components within normal limits  BASIC METABOLIC PANEL - Abnormal; Notable for the following components:   Sodium 117 (*)    Chloride 75 (*)    Glucose, Bld 220 (*)    BUN 24 (*)    Calcium 8.3 (*)    All other components within normal limits  GLUCOSE, CAPILLARY - Abnormal; Notable for the following components:   Glucose-Capillary 155 (*)    All other components within normal limits  GLUCOSE, CAPILLARY - Abnormal; Notable for the following components:   Glucose-Capillary 283 (*)    All other components within normal limits  BASIC METABOLIC PANEL - Abnormal; Notable for the following components:   Sodium 117 (*)    Chloride 75 (*)    CO2 34 (*)    Glucose, Bld 216 (*)    BUN 21 (*)    Calcium 8.1 (*)    All other components within normal limits  GLUCOSE, CAPILLARY - Abnormal; Notable for the following components:   Glucose-Capillary 233 (*)    All  other components  within normal limits  GLUCOSE, CAPILLARY - Abnormal; Notable for the following components:   Glucose-Capillary 245 (*)    All other components within normal limits  BASIC METABOLIC PANEL - Abnormal; Notable for the following components:   Sodium 124 (*)    Chloride 83 (*)    Glucose, Bld 143 (*)    Calcium 8.3 (*)    All other components within normal limits  BASIC METABOLIC PANEL - Abnormal; Notable for the following components:   Sodium 117 (*)    Chloride 77 (*)    Glucose, Bld 217 (*)    Calcium 7.9 (*)    All other components within normal limits  GLUCOSE, CAPILLARY - Abnormal; Notable for the following components:   Glucose-Capillary 251 (*)    All other components within normal limits  GLUCOSE, CAPILLARY - Abnormal; Notable for the following components:   Glucose-Capillary 157 (*)    All other components within normal limits  GLUCOSE, CAPILLARY - Abnormal; Notable for the following components:   Glucose-Capillary 133 (*)    All other components within normal limits  BASIC METABOLIC PANEL - Abnormal; Notable for the following components:   Sodium 123 (*)    Chloride 84 (*)    Glucose, Bld 203 (*)    Calcium 8.0 (*)    All other components within normal limits  GLUCOSE, CAPILLARY - Abnormal; Notable for the following components:   Glucose-Capillary 174 (*)    All other components within normal limits  GLUCOSE, CAPILLARY - Abnormal; Notable for the following components:   Glucose-Capillary 148 (*)    All other components within normal limits  BASIC METABOLIC PANEL - Abnormal; Notable for the following components:   Sodium 131 (*)    Chloride 90 (*)    CO2 34 (*)    Glucose, Bld 120 (*)    Calcium 8.6 (*)    All other components within normal limits  GLUCOSE, CAPILLARY - Abnormal; Notable for the following components:   Glucose-Capillary 181 (*)    All other components within normal limits  GLUCOSE, CAPILLARY - Abnormal; Notable for the following  components:   Glucose-Capillary 131 (*)    All other components within normal limits  GLUCOSE, CAPILLARY - Abnormal; Notable for the following components:   Glucose-Capillary 162 (*)    All other components within normal limits  GLUCOSE, CAPILLARY - Abnormal; Notable for the following components:   Glucose-Capillary 200 (*)    All other components within normal limits  GLUCOSE, CAPILLARY - Abnormal; Notable for the following components:   Glucose-Capillary 153 (*)    All other components within normal limits  GLUCOSE, CAPILLARY - Abnormal; Notable for the following components:   Glucose-Capillary 139 (*)    All other components within normal limits  GLUCOSE, CAPILLARY - Abnormal; Notable for the following components:   Glucose-Capillary 130 (*)    All other components within normal limits  GLUCOSE, CAPILLARY - Abnormal; Notable for the following components:   Glucose-Capillary 169 (*)    All other components within normal limits  GLUCOSE, CAPILLARY - Abnormal; Notable for the following components:   Glucose-Capillary 140 (*)    All other components within normal limits  GLUCOSE, CAPILLARY - Abnormal; Notable for the following components:   Glucose-Capillary 175 (*)    All other components within normal limits  GLUCOSE, CAPILLARY - Abnormal; Notable for the following components:   Glucose-Capillary 197 (*)    All other components within normal limits  GLUCOSE, CAPILLARY - Abnormal; Notable  for the following components:   Glucose-Capillary 189 (*)    All other components within normal limits  BASIC METABOLIC PANEL - Abnormal; Notable for the following components:   Sodium 126 (*)    Chloride 89 (*)    Glucose, Bld 161 (*)    Calcium 8.2 (*)    All other components within normal limits  GLUCOSE, CAPILLARY - Abnormal; Notable for the following components:   Glucose-Capillary 119 (*)    All other components within normal limits  GLUCOSE, CAPILLARY - Abnormal; Notable for the  following components:   Glucose-Capillary 160 (*)    All other components within normal limits  BASIC METABOLIC PANEL - Abnormal; Notable for the following components:   Sodium 123 (*)    Chloride 88 (*)    Glucose, Bld 159 (*)    Calcium 8.2 (*)    All other components within normal limits  GLUCOSE, CAPILLARY - Abnormal; Notable for the following components:   Glucose-Capillary 167 (*)    All other components within normal limits  BASIC METABOLIC PANEL - Abnormal; Notable for the following components:   Sodium 130 (*)    Chloride 92 (*)    Glucose, Bld 125 (*)    Calcium 8.5 (*)    All other components within normal limits  GLUCOSE, CAPILLARY - Abnormal; Notable for the following components:   Glucose-Capillary 185 (*)    All other components within normal limits  GLUCOSE, CAPILLARY - Abnormal; Notable for the following components:   Glucose-Capillary 126 (*)    All other components within normal limits  GLUCOSE, CAPILLARY - Abnormal; Notable for the following components:   Glucose-Capillary 161 (*)    All other components within normal limits  GLUCOSE, CAPILLARY - Abnormal; Notable for the following components:   Glucose-Capillary 112 (*)    All other components within normal limits  BASIC METABOLIC PANEL - Abnormal; Notable for the following components:   Sodium 131 (*)    Chloride 95 (*)    Glucose, Bld 120 (*)    Calcium 8.3 (*)    All other components within normal limits  GLUCOSE, CAPILLARY - Abnormal; Notable for the following components:   Glucose-Capillary 195 (*)    All other components within normal limits  GLUCOSE, CAPILLARY - Abnormal; Notable for the following components:   Glucose-Capillary 121 (*)    All other components within normal limits  GLUCOSE, CAPILLARY - Abnormal; Notable for the following components:   Glucose-Capillary 173 (*)    All other components within normal limits  GLUCOSE, CAPILLARY - Abnormal; Notable for the following components:    Glucose-Capillary 177 (*)    All other components within normal limits  COMPREHENSIVE METABOLIC PANEL - Abnormal; Notable for the following components:   Sodium 130 (*)    Chloride 93 (*)    Glucose, Bld 123 (*)    Calcium 8.6 (*)    Total Protein 5.6 (*)    Albumin 2.7 (*)    AST 11 (*)    All other components within normal limits  GLUCOSE, CAPILLARY - Abnormal; Notable for the following components:   Glucose-Capillary 205 (*)    All other components within normal limits  GLUCOSE, CAPILLARY - Abnormal; Notable for the following components:   Glucose-Capillary 131 (*)    All other components within normal limits  GLUCOSE, CAPILLARY - Abnormal; Notable for the following components:   Glucose-Capillary 198 (*)    All other components within normal limits  GLUCOSE, CAPILLARY - Abnormal;  Notable for the following components:   Glucose-Capillary 134 (*)    All other components within normal limits  GLUCOSE, CAPILLARY - Abnormal; Notable for the following components:   Glucose-Capillary 100 (*)    All other components within normal limits  GLUCOSE, CAPILLARY - Abnormal; Notable for the following components:   Glucose-Capillary 122 (*)    All other components within normal limits  GLUCOSE, CAPILLARY - Abnormal; Notable for the following components:   Glucose-Capillary 192 (*)    All other components within normal limits  POCT I-STAT EG7 - Abnormal; Notable for the following components:   pCO2, Ven 70.8 (*)    pO2, Ven 69.0 (*)    Bicarbonate 35.5 (*)    TCO2 38 (*)    Acid-Base Excess 6.0 (*)    Sodium 117 (*)    Calcium, Ion 0.93 (*)    HCT 48.0 (*)    Hemoglobin 16.3 (*)    All other components within normal limits  POCT I-STAT EG7 - Abnormal; Notable for the following components:   pCO2, Ven 77.6 (*)    Bicarbonate 37.4 (*)    TCO2 40 (*)    Acid-Base Excess 7.0 (*)    Sodium 117 (*)    Calcium, Ion 1.13 (*)    HCT 50.0 (*)    Hemoglobin 17.0 (*)    All other  components within normal limits  POCT I-STAT 7, (LYTES, BLD GAS, ICA,H+H) - Abnormal; Notable for the following components:   pCO2 arterial 67.7 (*)    pO2, Arterial 235.0 (*)    Bicarbonate 37.6 (*)    TCO2 40 (*)    Acid-Base Excess 9.0 (*)    Sodium 118 (*)    Calcium, Ion 1.14 (*)    All other components within normal limits  POCT I-STAT EG7 - Abnormal; Notable for the following components:   pCO2, Ven 62.2 (*)    pO2, Ven 123.0 (*)    Bicarbonate 36.9 (*)    TCO2 39 (*)    Acid-Base Excess 9.0 (*)    Sodium 118 (*)    Calcium, Ion 1.02 (*)    HCT 48.0 (*)    Hemoglobin 16.3 (*)    All other components within normal limits  NOVEL CORONAVIRUS, NAA (HOSPITAL ORDER, SEND-OUT TO REF LAB)  CULTURE, BLOOD (ROUTINE X 2)  CULTURE, BLOOD (ROUTINE X 2)  SARS CORONAVIRUS 2  CULTURE, RESPIRATORY  NOVEL CORONAVIRUS, NAA (HOSPITAL ORDER, SEND-OUT TO REF LAB)  NOVEL CORONAVIRUS, NAA (HOSPITAL ORDER, SEND-OUT TO REF LAB)  TROPONIN I  TRIGLYCERIDES  HIV ANTIBODY (ROUTINE TESTING W REFLEX)  PHOSPHORUS  PROCALCITONIN  SODIUM, URINE, RANDOM  GLUCOSE, CAPILLARY  GLUCOSE, CAPILLARY  GLUCOSE, CAPILLARY  PROCALCITONIN  CBC  MAGNESIUM  PHOSPHORUS  GLUCOSE, CAPILLARY  GLUCOSE, CAPILLARY  GLUCOSE, CAPILLARY  MAGNESIUM  CBC  MAGNESIUM  HEMOGLOBIN A1C  CBC  MAGNESIUM  CBC  MAGNESIUM  CBC  MAGNESIUM  CBC  MAGNESIUM  GLUCOSE, CAPILLARY  GLUCOSE, CAPILLARY    EKG EKG Interpretation  Date/Time:  Wednesday December 03 2018 10:00:33 EDT Ventricular Rate:  61 PR Interval:    QRS Duration: 80 QT Interval:  432 QTC Calculation: 436 R Axis:   68 Text Interpretation:  Sinus rhythm Prolonged PR interval No significant change since last tracing Confirmed by Theotis Burrow [phone]) on 12/03/2018 10:20:34 AM   Radiology No results found.  Procedures Procedures (including critical care time)  Medications Ordered in ED Medications  furosemide (LASIX) injection 40 mg (40 mg  Intravenous Given 12/03/18 1307)  etomidate (AMIDATE) injection 20 mg (20 mg Intravenous Given 12/03/18 1445)  succinylcholine (ANECTINE) injection 150 mg (150 mg Intravenous Given 12/03/18 1446)  ondansetron (ZOFRAN) injection 4 mg (4 mg Intravenous Given 12/03/18 1521)  midazolam (VERSED) injection 1 mg (1 mg Intravenous Given 12/03/18 1615)  fentaNYL (SUBLIMAZE) injection 100 mcg (100 mcg Intravenous Given 12/03/18 1615)  calcium gluconate 1 g/ 50 mL sodium chloride IVPB (0 g Intravenous Stopped 12/03/18 1946)  vancomycin (VANCOCIN) 1,500 mg in sodium chloride 0.9 % 500 mL IVPB ( Intravenous Stopped 12/03/18 2037)  dextrose 50 % solution 12.5 g (12.5 g Intravenous Given 12/03/18 2101)  dextrose 50 % solution (  Duplicate 0/10/93 2355)  mupirocin ointment (BACTROBAN) 2 % 1 application (1 application Nasal Given 12/08/18 0842)  Chlorhexidine Gluconate Cloth 2 % PADS 6 each (6 each Topical Given 12/08/18 1059)  furosemide (LASIX) 10 MG/ML injection (  Duplicate 7/32/20 2542)  dextrose 50 % solution 12.5 g (12.5 g Intravenous Given 12/04/18 0408)  magnesium sulfate IVPB 2 g 50 mL ( Intravenous Stopped 12/04/18 1138)  cefTRIAXone (ROCEPHIN) 2 g in sodium chloride 0.9 % 100 mL IVPB (0 g Intravenous Stopped 12/07/18 1015)  potassium chloride (K-DUR) CR tablet 30 mEq (30 mEq Oral Given 12/05/18 1159)  furosemide (LASIX) injection 40 mg (40 mg Intravenous Given 12/05/18 0932)  magnesium sulfate IVPB 2 g 50 mL (2 g Intravenous New Bag/Given 12/06/18 1408)  potassium chloride SA (K-DUR) CR tablet 40 mEq (40 mEq Oral Given 12/06/18 1351)  traMADol (ULTRAM) tablet 50 mg (50 mg Oral Given 12/07/18 0659)  polyethylene glycol (MIRALAX / GLYCOLAX) packet 17 g (17 g Oral Given 12/08/18 0849)  white petrolatum (VASELINE) gel (  Given 12/08/18 2301)  chlorpheniramine-HYDROcodone (TUSSIONEX) 10-8 MG/5ML suspension 5 mL (5 mLs Oral Given 12/09/18 0350)  enalaprilat (VASOTEC) injection 1.25 mg (1.25 mg Intravenous Given 12/11/18 2238)   haloperidol lactate (HALDOL) injection 3 mg (3 mg Intravenous Given 12/16/18 0018)     Initial Impression / Assessment and Plan / ED Course  I have reviewed the triage vital signs and the nursing notes.  Pertinent labs & imaging results that were available during my care of the patient were reviewed by me and considered in my medical decision making (see chart for details).        Patient will need admission to the hospital for further evaluation and care for shortness of breath.  Patient most likely has CHF exacerbation that is causing her symptoms.  Patient will be admitted to the [facility] service the patient was given Lasix here in the emergency department.   Final Clinical Impressions(s) / ED Diagnoses   Final diagnoses:  SOB (shortness of breath)  Acute congestive heart failure, unspecified heart failure type [facility])    ED Discharge Orders         Ordered    oxyCODONE (OXY IR/ROXICODONE) 5 MG immediate release tablet  Daily     12/18/18 1452    diazepam (VALIUM) 2 MG tablet  2 times daily     12/18/18 1452    asenapine (SAPHRIS) 5 MG SUBL 24 hr tablet  2 times daily     12/18/18 1452    lurasidone (LATUDA) 40 MG TABS tablet  2 times daily     12/18/18 1452    metoprolol tartrate (LOPRESSOR) 25 MG tablet  2 times daily     12/17/18 1043    sodium chloride 1 g tablet  3 times daily with  meals     12/17/18 6606    Basic Metabolic Panel (BMET)     12/17/18 1045    nicotine (NICODERM CQ - DOSED IN MG/24 HOURS) 21 mg/24hr patch  Daily     12/11/18 1222    QUEtiapine (SEROQUEL) 25 MG tablet  Daily     12/11/18 1222    Activity as tolerated - No restrictions     12/12/18 0805    Increase activity slowly     12/12/18 0805    Call MD for:  persistant nausea and vomiting     12/12/18 0805    Call MD for:  severe uncontrolled pain     12/12/18 0805    Discharge instructions    Comments: Follow up with PCP in 7-10 days. Daily weights.   12/12/18 0805    Call MD  for:  difficulty breathing, headache or visual disturbances     12/12/18 0805    (HEART FAILURE PATIENTS) Call MD:  Anytime you have any of the following symptoms: 1) 3 pound weight gain in 24 hours or 5 pounds in 1 week 2) shortness of breath, with or without a dry hacking cough 3) swelling in the hands, feet or stomach 4) if you have to sleep on extra pillows at night in order to breathe.     12/12/18 0805    metoprolol tartrate (LOPRESSOR) 25 MG tablet  2 times daily,   Status:  Discontinued     12/11/18 1222    diazepam (VALIUM) 2 MG tablet  2 times daily,   Status:  Discontinued     12/11/18 1222    QUEtiapine (SEROQUEL) 50 MG tablet  Daily at bedtime     12/11/18 1222    traZODone (DESYREL) 50 MG tablet  Daily at bedtime     12/11/18 1222    calcium carbonate (TUMS - DOSED IN MG ELEMENTAL CALCIUM) 500 MG chewable tablet  2 times daily PRN     12/11/18 1222    polyethylene glycol (MIRALAX) 17 g packet  Daily     12/11/18 1222    valproic acid (DEPAKENE) 250 MG capsule  2 times daily     12/11/18 1222    gabapentin (NEURONTIN) 100 MG capsule  Daily at bedtime     12/11/18 1222    sodium chloride 1 g tablet  3 times daily with meals,   Status:  Discontinued     12/11/18 1222    albuterol (PROVENTIL) (2.5 MG/3ML) 0.083% nebulizer solution  Every 4 hours PRN     12/11/18 1222    arformoterol (BROVANA) 15 MCG/2ML NEBU  2 times daily     12/11/18 1222    benzonatate (TESSALON) 100 MG capsule  3 times daily PRN     12/11/18 1222    budesonide (PULMICORT) 0.5 MG/2ML nebulizer solution  2 times daily     12/11/18 1222    predniSONE (DELTASONE) 20 MG tablet     12/11/18 1222    oxyCODONE (OXY IR/ROXICODONE) 5 MG immediate release tablet  Daily,   Status:  Discontinued     12/11/18 1222    asenapine (SAPHRIS) 5 MG SUBL 24 hr tablet  2 times daily,   Status:  Discontinued     12/11/18 1222    Activity as tolerated - No restrictions     12/11/18 1222    Increase activity slowly      12/11/18 1222    Diet - low sodium heart healthy  12/11/18 1222    Call MD for:  persistant nausea and vomiting     12/11/18 1222    Discharge instructions    Comments: PT/OT eval and treat. Follow up with PCP in 7-10 days after discharge.   12/11/18 1222    Call MD for:  difficulty breathing, headache or visual disturbances     12/11/18 [address] [place] 01/19/19 [phone]

## 2019-02-15 NOTE — ED Provider Notes (Signed)
Medical screening examination/treatment/procedure(s) were conducted as a shared visit with non-physician practitioner(s) and myself.  I personally evaluated the patient during the encounter.  EKG Interpretation  Date/Time:  Wednesday December 03 2018 10:00:33 EDT Ventricular Rate:  61 PR Interval:    QRS Duration: 80 QT Interval:  432 QTC Calculation: 436 R Axis:   68 Text Interpretation:  Sinus rhythm Prolonged PR interval No significant change since last tracing Confirmed by Theotis Burrow [phone]) on 12/03/2018 10:20:34 AM  Patient brought in by EMS from nursing facility for hypoxia and respiratory distress.  She was ill-appearing on exam, unable to provide history.  She had wheezing, rales bilaterally with increased work of breathing. CXR shows cardiomegaly, pulmonary edema and effusions suggestive of CHF exacerbation. Because of pt's mental status decline and respiratory distress, elected to intubate; see procedure note for details. Given lasix in ED, started on propofol for sedation and admitted to MICU for further care.  CRITICAL CARE Performed by: Wenda Overland Tamkia Temples   Total critical care time: 45 minutes  Critical care time was exclusive of separately billable procedures and treating other patients.  Critical care was necessary to treat or prevent imminent or life-threatening deterioration.  Critical care was time spent personally by me on the following activities: development of treatment plan with patient and/or surrogate as well as nursing, discussions with consultants, evaluation of patient's response to treatment, examination of patient, obtaining history from patient or surrogate, ordering and performing treatments and interventions, ordering and review of laboratory studies, ordering and review of radiographic studies, pulse oximetry and re-evaluation of patient's condition.    Aylin Rhoads, Wenda Overland, MD 02/15/19 [phone]

## 2019-03-09 ENCOUNTER — Encounter (HOSPITAL_COMMUNITY): Payer: Self-pay | Admitting: Family Medicine

## 2019-03-09 ENCOUNTER — Emergency Department (HOSPITAL_COMMUNITY): Payer: Medicare Other

## 2019-03-09 ENCOUNTER — Inpatient Hospital Stay (HOSPITAL_COMMUNITY)
Admission: EM | Admit: 2019-03-09 | Discharge: 2019-03-16 | DRG: 690 | Disposition: A | Discharge status: Skilled Nursing Facility | Payer: Medicare Other | Source: Skilled Nursing Facility | Attending: Internal Medicine | Admitting: Internal Medicine

## 2019-03-09 DIAGNOSIS — R109 Unspecified abdominal pain: Secondary | ICD-10-CM | POA: Diagnosis present

## 2019-03-09 DIAGNOSIS — Z7984 Long term (current) use of oral hypoglycemic drugs: Secondary | ICD-10-CM

## 2019-03-09 DIAGNOSIS — Z881 Allergy status to other antibiotic agents status: Secondary | ICD-10-CM | POA: Diagnosis not present

## 2019-03-09 DIAGNOSIS — J449 Chronic obstructive pulmonary disease, unspecified: Secondary | ICD-10-CM | POA: Diagnosis present

## 2019-03-09 DIAGNOSIS — M810 Age-related osteoporosis without current pathological fracture: Secondary | ICD-10-CM | POA: Diagnosis present

## 2019-03-09 DIAGNOSIS — J9611 Chronic respiratory failure with hypoxia: Secondary | ICD-10-CM | POA: Diagnosis present

## 2019-03-09 DIAGNOSIS — Z915 Personal history of self-harm: Secondary | ICD-10-CM

## 2019-03-09 DIAGNOSIS — R1010 Upper abdominal pain, unspecified: Secondary | ICD-10-CM | POA: Diagnosis not present

## 2019-03-09 DIAGNOSIS — Z7982 Long term (current) use of aspirin: Secondary | ICD-10-CM

## 2019-03-09 DIAGNOSIS — F32A Depression, unspecified: Secondary | ICD-10-CM | POA: Diagnosis present

## 2019-03-09 DIAGNOSIS — J90 Pleural effusion, not elsewhere classified: Secondary | ICD-10-CM | POA: Diagnosis not present

## 2019-03-09 DIAGNOSIS — Z8589 Personal history of malignant neoplasm of other organs and systems: Secondary | ICD-10-CM

## 2019-03-09 DIAGNOSIS — F17210 Nicotine dependence, cigarettes, uncomplicated: Secondary | ICD-10-CM | POA: Diagnosis present

## 2019-03-09 DIAGNOSIS — R1084 Generalized abdominal pain: Secondary | ICD-10-CM | POA: Diagnosis not present

## 2019-03-09 DIAGNOSIS — A499 Bacterial infection, unspecified: Secondary | ICD-10-CM | POA: Diagnosis present

## 2019-03-09 DIAGNOSIS — I110 Hypertensive heart disease with heart failure: Secondary | ICD-10-CM | POA: Diagnosis present

## 2019-03-09 DIAGNOSIS — I5043 Acute on chronic combined systolic (congestive) and diastolic (congestive) heart failure: Secondary | ICD-10-CM | POA: Diagnosis present

## 2019-03-09 DIAGNOSIS — E785 Hyperlipidemia, unspecified: Secondary | ICD-10-CM | POA: Diagnosis present

## 2019-03-09 DIAGNOSIS — Z7951 Long term (current) use of inhaled steroids: Secondary | ICD-10-CM

## 2019-03-09 DIAGNOSIS — Z9981 Dependence on supplemental oxygen: Secondary | ICD-10-CM

## 2019-03-09 DIAGNOSIS — K5900 Constipation, unspecified: Secondary | ICD-10-CM | POA: Diagnosis present

## 2019-03-09 DIAGNOSIS — Z20828 Contact with and (suspected) exposure to other viral communicable diseases: Secondary | ICD-10-CM | POA: Diagnosis present

## 2019-03-09 DIAGNOSIS — N390 Urinary tract infection, site not specified: Secondary | ICD-10-CM | POA: Diagnosis not present

## 2019-03-09 DIAGNOSIS — R451 Restlessness and agitation: Secondary | ICD-10-CM | POA: Diagnosis not present

## 2019-03-09 DIAGNOSIS — E119 Type 2 diabetes mellitus without complications: Secondary | ICD-10-CM | POA: Diagnosis present

## 2019-03-09 DIAGNOSIS — F250 Schizoaffective disorder, bipolar type: Secondary | ICD-10-CM | POA: Diagnosis present

## 2019-03-09 DIAGNOSIS — K219 Gastro-esophageal reflux disease without esophagitis: Secondary | ICD-10-CM | POA: Diagnosis present

## 2019-03-09 DIAGNOSIS — J9811 Atelectasis: Secondary | ICD-10-CM | POA: Diagnosis present

## 2019-03-09 DIAGNOSIS — I10 Essential (primary) hypertension: Secondary | ICD-10-CM | POA: Diagnosis not present

## 2019-03-09 DIAGNOSIS — E871 Hypo-osmolality and hyponatremia: Secondary | ICD-10-CM | POA: Diagnosis present

## 2019-03-09 DIAGNOSIS — K429 Umbilical hernia without obstruction or gangrene: Secondary | ICD-10-CM | POA: Diagnosis present

## 2019-03-09 DIAGNOSIS — F329 Major depressive disorder, single episode, unspecified: Secondary | ICD-10-CM | POA: Diagnosis present

## 2019-03-09 DIAGNOSIS — B9620 Unspecified Escherichia coli [E. coli] as the cause of diseases classified elsewhere: Secondary | ICD-10-CM | POA: Diagnosis present

## 2019-03-09 DIAGNOSIS — Z79891 Long term (current) use of opiate analgesic: Secondary | ICD-10-CM

## 2019-03-09 DIAGNOSIS — F600 Paranoid personality disorder: Secondary | ICD-10-CM | POA: Diagnosis not present

## 2019-03-09 DIAGNOSIS — E669 Obesity, unspecified: Secondary | ICD-10-CM | POA: Diagnosis present

## 2019-03-09 DIAGNOSIS — E861 Hypovolemia: Secondary | ICD-10-CM | POA: Diagnosis present

## 2019-03-09 DIAGNOSIS — Z713 Dietary counseling and surveillance: Secondary | ICD-10-CM

## 2019-03-09 DIAGNOSIS — F419 Anxiety disorder, unspecified: Secondary | ICD-10-CM | POA: Diagnosis present

## 2019-03-09 DIAGNOSIS — Z880 Allergy status to penicillin: Secondary | ICD-10-CM

## 2019-03-09 DIAGNOSIS — I5032 Chronic diastolic (congestive) heart failure: Secondary | ICD-10-CM | POA: Diagnosis present

## 2019-03-09 DIAGNOSIS — I160 Hypertensive urgency: Secondary | ICD-10-CM | POA: Diagnosis present

## 2019-03-09 DIAGNOSIS — F259 Schizoaffective disorder, unspecified: Secondary | ICD-10-CM | POA: Diagnosis present

## 2019-03-09 DIAGNOSIS — N3000 Acute cystitis without hematuria: Secondary | ICD-10-CM | POA: Diagnosis present

## 2019-03-09 DIAGNOSIS — Z6834 Body mass index (BMI) 34.0-34.9, adult: Secondary | ICD-10-CM

## 2019-03-09 DIAGNOSIS — Z79899 Other long term (current) drug therapy: Secondary | ICD-10-CM

## 2019-03-09 LAB — CBC
HCT: 43 % (ref 36.0–46.0)
Hemoglobin: 14.2 g/dL (ref 12.0–15.0)
MCH: 29.5 pg (ref 26.0–34.0)
MCHC: 33 g/dL (ref 30.0–36.0)
MCV: 89.2 fL (ref 80.0–100.0)
Platelets: 230 10*3/uL (ref 150–400)
RBC: 4.82 MIL/uL (ref 3.87–5.11)
RDW: 13.5 % (ref 11.5–15.5)
WBC: 9.7 10*3/uL (ref 4.0–10.5)
nRBC: 0 % (ref 0.0–0.2)

## 2019-03-09 LAB — BASIC METABOLIC PANEL
Anion gap: 9 (ref 5–15)
BUN: 6 mg/dL (ref 6–20)
CO2: 28 mmol/L (ref 22–32)
Calcium: 8.8 mg/dL — ABNORMAL LOW (ref 8.9–10.3)
Chloride: 86 mmol/L — ABNORMAL LOW (ref 98–111)
Creatinine, Ser: 0.49 mg/dL (ref 0.44–1.00)
GFR calc Af Amer: >60 mL/min (ref >60)
GFR calc non Af Amer: >60 mL/min (ref >60)
Glucose, Bld: 81 mg/dL (ref 70–99)
Potassium: 4.6 mmol/L (ref 3.5–5.1)
Sodium: 123 mmol/L — ABNORMAL LOW (ref 135–145)

## 2019-03-09 LAB — URINALYSIS, ROUTINE W REFLEX MICROSCOPIC
Bilirubin Urine: NEGATIVE
Glucose, UA: NEGATIVE mg/dL
Ketones, ur: NEGATIVE mg/dL
Nitrite: POSITIVE — AB
Protein, ur: NEGATIVE mg/dL
Specific Gravity, Urine: 1.002 — ABNORMAL LOW (ref 1.005–1.030)
pH: 7 (ref 5.0–8.0)

## 2019-03-09 LAB — COMPREHENSIVE METABOLIC PANEL
ALT: 7 U/L (ref 0–44)
AST: 11 U/L — ABNORMAL LOW (ref 15–41)
Albumin: 3.5 g/dL (ref 3.5–5.0)
Alkaline Phosphatase: 80 U/L (ref 38–126)
Anion gap: 9 (ref 5–15)
BUN: 7 mg/dL (ref 6–20)
CO2: 28 mmol/L (ref 22–32)
Calcium: 8.7 mg/dL — ABNORMAL LOW (ref 8.9–10.3)
Chloride: 83 mmol/L — ABNORMAL LOW (ref 98–111)
Creatinine, Ser: 0.55 mg/dL (ref 0.44–1.00)
GFR calc Af Amer: >60 mL/min (ref >60)
GFR calc non Af Amer: >60 mL/min (ref >60)
Glucose, Bld: 90 mg/dL (ref 70–99)
Potassium: 4.2 mmol/L (ref 3.5–5.1)
Sodium: 120 mmol/L — ABNORMAL LOW (ref 135–145)
Total Bilirubin: 0.4 mg/dL (ref 0.3–1.2)
Total Protein: 7.5 g/dL (ref 6.5–8.1)

## 2019-03-09 LAB — TROPONIN I (HIGH SENSITIVITY)
Troponin I (High Sensitivity): 7 ng/L (ref <18)
Troponin I (High Sensitivity): 7 ng/L (ref <18)

## 2019-03-09 LAB — SODIUM, URINE, RANDOM: Sodium, Ur: 39 mmol/L

## 2019-03-09 LAB — OSMOLALITY, URINE: Osmolality, Ur: 106 mOsm/kg — ABNORMAL LOW (ref 300–900)

## 2019-03-09 LAB — VALPROIC ACID LEVEL: Valproic Acid Lvl: 52 ug/mL (ref 50.0–100.0)

## 2019-03-09 LAB — CBG MONITORING, ED: Glucose-Capillary: 100 mg/dL — ABNORMAL HIGH (ref 70–99)

## 2019-03-09 LAB — LIPASE, BLOOD: Lipase: 31 U/L (ref 11–51)

## 2019-03-09 MED ORDER — AMLODIPINE BESYLATE 5 MG PO TABS
5.0000 mg | ORAL_TABLET | Freq: Once | ORAL | Status: AC
  Administered 2019-03-09: 5 mg via ORAL
  Filled 2019-03-09: qty 1

## 2019-03-09 MED ORDER — BUPROPION HCL ER (XL) 150 MG PO TB24
150.0000 mg | ORAL_TABLET | Freq: Every day | ORAL | Status: DC
  Administered 2019-03-10 – 2019-03-16 (×7): 150 mg via ORAL
  Filled 2019-03-09 (×7): qty 1

## 2019-03-09 MED ORDER — ASPIRIN 81 MG PO CHEW
81.0000 mg | CHEWABLE_TABLET | Freq: Every day | ORAL | Status: DC
  Administered 2019-03-10 – 2019-03-16 (×7): 81 mg via ORAL
  Filled 2019-03-09 (×7): qty 1

## 2019-03-09 MED ORDER — DIAZEPAM 5 MG PO TABS
5.0000 mg | ORAL_TABLET | Freq: Three times a day (TID) | ORAL | Status: DC
  Administered 2019-03-09 – 2019-03-16 (×20): 5 mg via ORAL
  Filled 2019-03-09 (×20): qty 1

## 2019-03-09 MED ORDER — ALUM & MAG HYDROXIDE-SIMETH 200-200-20 MG/5ML PO SUSP
30.0000 mL | Freq: Four times a day (QID) | ORAL | Status: DC | PRN
  Administered 2019-03-10 – 2019-03-16 (×6): 30 mL via ORAL
  Filled 2019-03-09 (×6): qty 30

## 2019-03-09 MED ORDER — FLUTICASONE FUROATE-VILANTEROL 100-25 MCG/INH IN AEPB
1.0000 | INHALATION_SPRAY | Freq: Every day | RESPIRATORY_TRACT | Status: DC
  Administered 2019-03-10 – 2019-03-16 (×7): 1 via RESPIRATORY_TRACT
  Filled 2019-03-09: qty 28

## 2019-03-09 MED ORDER — QUETIAPINE FUMARATE 50 MG PO TABS
50.0000 mg | ORAL_TABLET | Freq: Every day | ORAL | Status: DC
  Administered 2019-03-09 – 2019-03-15 (×7): 50 mg via ORAL
  Filled 2019-03-09 (×7): qty 1

## 2019-03-09 MED ORDER — ASENAPINE MALEATE 5 MG SL SUBL
5.0000 mg | SUBLINGUAL_TABLET | Freq: Two times a day (BID) | SUBLINGUAL | Status: DC
  Administered 2019-03-09 – 2019-03-10 (×2): 5 mg via SUBLINGUAL
  Filled 2019-03-09 (×3): qty 1

## 2019-03-09 MED ORDER — VALPROIC ACID 250 MG PO CAPS
500.0000 mg | ORAL_CAPSULE | Freq: Two times a day (BID) | ORAL | Status: DC
  Administered 2019-03-09 – 2019-03-16 (×14): 500 mg via ORAL
  Filled 2019-03-09 (×15): qty 2

## 2019-03-09 MED ORDER — CALCIUM CARBONATE ANTACID 500 MG PO CHEW: 1.0000 | CHEWABLE_TABLET | Freq: Two times a day (BID) | ORAL | Status: DC | PRN

## 2019-03-09 MED ORDER — OXYCODONE HCL 5 MG PO TABS
5.0000 mg | ORAL_TABLET | Freq: Two times a day (BID) | ORAL | Status: DC | PRN
  Administered 2019-03-10 – 2019-03-16 (×12): 5 mg via ORAL
  Filled 2019-03-09 (×12): qty 1

## 2019-03-09 MED ORDER — SODIUM CHLORIDE 1 G PO TABS
1.0000 g | ORAL_TABLET | Freq: Three times a day (TID) | ORAL | Status: DC
  Administered 2019-03-10 – 2019-03-16 (×20): 1 g via ORAL
  Filled 2019-03-09 (×22): qty 1

## 2019-03-09 MED ORDER — IPRATROPIUM-ALBUTEROL 0.5-2.5 (3) MG/3ML IN SOLN: 3.0000 mL | RESPIRATORY_TRACT | Status: DC

## 2019-03-09 MED ORDER — SODIUM CHLORIDE 0.9 % IV SOLN
1.0000 g | INTRAVENOUS | Status: DC
  Administered 2019-03-10 – 2019-03-11 (×3): 1 g via INTRAVENOUS
  Filled 2019-03-09 (×2): qty 1
  Filled 2019-03-09: qty 10
  Filled 2019-03-09: qty 1

## 2019-03-09 MED ORDER — GABAPENTIN 100 MG PO CAPS
100.0000 mg | ORAL_CAPSULE | Freq: Every day | ORAL | Status: DC
  Administered 2019-03-10 – 2019-03-15 (×7): 100 mg via ORAL
  Filled 2019-03-09 (×7): qty 1

## 2019-03-09 MED ORDER — ACETAMINOPHEN 325 MG PO TABS
650.0000 mg | ORAL_TABLET | Freq: Four times a day (QID) | ORAL | Status: DC | PRN
  Administered 2019-03-10 – 2019-03-14 (×3): 650 mg via ORAL
  Filled 2019-03-09 (×3): qty 2

## 2019-03-09 MED ORDER — DOCUSATE SODIUM 100 MG PO CAPS: 100.0000 mg | ORAL_CAPSULE | Freq: Two times a day (BID) | ORAL | Status: DC

## 2019-03-09 MED ORDER — NIFEDIPINE ER OSMOTIC RELEASE 60 MG PO TB24
90.0000 mg | ORAL_TABLET | Freq: Every day | ORAL | Status: DC
  Administered 2019-03-10 – 2019-03-16 (×7): 90 mg via ORAL
  Filled 2019-03-09 (×7): qty 1

## 2019-03-09 MED ORDER — METOPROLOL TARTRATE 25 MG PO TABS
25.0000 mg | ORAL_TABLET | Freq: Two times a day (BID) | ORAL | Status: DC
  Administered 2019-03-10 – 2019-03-16 (×14): 25 mg via ORAL
  Filled 2019-03-09 (×14): qty 1

## 2019-03-09 MED ORDER — ALBUTEROL SULFATE (2.5 MG/3ML) 0.083% IN NEBU
2.5000 mg | INHALATION_SOLUTION | RESPIRATORY_TRACT | Status: DC | PRN
  Administered 2019-03-09 – 2019-03-16 (×4): 2.5 mg via RESPIRATORY_TRACT
  Filled 2019-03-09 (×4): qty 3

## 2019-03-09 MED ORDER — CIPROFLOXACIN HCL 500 MG PO TABS
500.0000 mg | ORAL_TABLET | Freq: Once | ORAL | Status: AC
  Administered 2019-03-09: 500 mg via ORAL
  Filled 2019-03-09: qty 1

## 2019-03-09 MED ORDER — DOCUSATE SODIUM 100 MG PO CAPS
100.0000 mg | ORAL_CAPSULE | Freq: Two times a day (BID) | ORAL | Status: DC
  Administered 2019-03-09 – 2019-03-16 (×10): 100 mg via ORAL
  Filled 2019-03-09 (×11): qty 1

## 2019-03-09 MED ORDER — FUROSEMIDE 20 MG PO TABS
20.0000 mg | ORAL_TABLET | Freq: Two times a day (BID) | ORAL | Status: DC
  Administered 2019-03-10 – 2019-03-16 (×11): 20 mg via ORAL
  Filled 2019-03-09 (×11): qty 1

## 2019-03-09 MED ORDER — BENZONATATE 100 MG PO CAPS: 100.0000 mg | ORAL_CAPSULE | Freq: Three times a day (TID) | ORAL | Status: DC | PRN

## 2019-03-09 MED ORDER — MELATONIN 5 MG PO TABS
5.0000 mg | ORAL_TABLET | Freq: Every day | ORAL | Status: DC
  Administered 2019-03-09 – 2019-03-15 (×7): 5 mg via ORAL
  Filled 2019-03-09 (×8): qty 1

## 2019-03-09 MED ORDER — HYDROCODONE-ACETAMINOPHEN 5-325 MG PO TABS
1.0000 | ORAL_TABLET | ORAL | Status: AC
  Administered 2019-03-09: 1 via ORAL
  Filled 2019-03-09: qty 1

## 2019-03-09 MED ORDER — HYDROXYZINE HCL 25 MG PO TABS
50.0000 mg | ORAL_TABLET | Freq: Three times a day (TID) | ORAL | Status: DC
  Administered 2019-03-09 – 2019-03-16 (×20): 50 mg via ORAL
  Filled 2019-03-09 (×20): qty 2

## 2019-03-09 MED ORDER — ENOXAPARIN SODIUM 40 MG/0.4ML ~~LOC~~ SOLN
40.0000 mg | SUBCUTANEOUS | Status: DC
  Administered 2019-03-09 – 2019-03-15 (×7): 40 mg via SUBCUTANEOUS
  Filled 2019-03-09 (×7): qty 0.4

## 2019-03-09 MED ORDER — SODIUM CHLORIDE 0.9 % IV BOLUS
500.0000 mL | Freq: Once | INTRAVENOUS | Status: AC
  Administered 2019-03-09: 500 mL via INTRAVENOUS

## 2019-03-09 MED ORDER — PANTOPRAZOLE SODIUM 40 MG PO TBEC
40.0000 mg | DELAYED_RELEASE_TABLET | Freq: Every day | ORAL | Status: DC
  Administered 2019-03-10 – 2019-03-15 (×7): 40 mg via ORAL
  Filled 2019-03-09 (×7): qty 1

## 2019-03-09 MED ORDER — SODIUM CHLORIDE 0.9 % IV SOLN: INTRAVENOUS | Status: DC

## 2019-03-09 MED ORDER — SODIUM CHLORIDE 0.9 % IV SOLN
INTRAVENOUS | Status: DC | PRN
  Administered 2019-03-10: 1000 mL via INTRAVENOUS
  Administered 2019-03-10: 23:00:00 10 mL/h via INTRAVENOUS

## 2019-03-09 MED ORDER — POLYETHYLENE GLYCOL 3350 17 G PO PACK
17.0000 g | PACK | Freq: Two times a day (BID) | ORAL | Status: DC
  Administered 2019-03-09 – 2019-03-16 (×10): 17 g via ORAL
  Filled 2019-03-09 (×10): qty 1

## 2019-03-09 MED ORDER — POLYETHYLENE GLYCOL 3350 17 G PO PACK: 17.0000 g | PACK | Freq: Every day | ORAL | Status: DC

## 2019-03-09 NOTE — ED Provider Notes (Signed)
[facility] DEPT Provider Note   CSN: [id number] Arrival date & time: 03/09/19  1604     History   Chief Complaint Chief Complaint  Patient presents with  . Psychiatric Evaluation    HPI Natalie Thomas is a 58 y.o. female.     HPI Pt feels like her medications are off.  Several things are bothering her.  Pt states staff at her facility were concerned about her.  She has not been acting normally.  She has been paranoid.  She denies any SI but she has been depressed.  People have been threatening her.    Pt also complains of urine being cloudy and she is urinating frequently.   Pt's main concern now is that she has to urinate.  She uses a diaper but will use the restroom when she has assistance.  Pt uses a wheelchair.    Pt resides at an assisted living facility. Past Medical History:  Diagnosis Date  . Acute on chronic respiratory failure with hypoxia and hypercapnia ([place]) 10/07/2017  . Anemia 10/16/2017  . Anxiety and depression   . CHF (congestive heart failure) ([place])   . Chronic hyponatremia 04/08/2007   Qualifier: Diagnosis of  By: Marca Ancona RMA, Lucy    . Chronic respiratory failure with hypoxia ([place]) 10/07/2017  . Closed comminuted intertrochanteric fracture of right femur ([place]) 11/07/2017  . COPD 04/08/2007   Qualifier: Diagnosis of  By: Marca Ancona RMA, Lucy    . Cough 05/08/2011  . Depression   . Diabetes mellitus, type 2 ([place])    pt denies but states that she has been treated for DM  . Dyspnea 10/16/2017  . Dysuria 04/08/2007   Qualifier: Diagnosis of  By: Reatha Armour, Lucy    . Essential hypertension 04/08/2007   Qualifier: Diagnosis of  By: Marca Ancona RMA, Lucy    . GERD (gastroesophageal reflux disease)   . HIP PAIN, LEFT 12/12/2007   Qualifier: Diagnosis of  By: Loanne Drilling MD, Jacelyn Pi   . Hyperlipidemia 10/07/2017  . Hypertension   . Hyponatremia 07/27/2011  . Incontinence of urine 08/14/2011  . Lumbar disc disease 04/09/2011  . MENOPAUSE,  PREMATURE 04/08/2007   Qualifier: Diagnosis of  By: Marca Ancona RMA, Lucy    . 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 Osteoporosis   . Osteoporosis 04/08/2007   Qualifier: Diagnosis of  By: Reatha Armour, Lucy    . Polycythemia secondary to smoking 04/09/2011  . S/P right hip fracture 11/07/2017  . Schizoaffective disorder, bipolar type ([place]) 07/05/2011    Patient Active Problem List   Diagnosis Date Noted  . Acute hypoxemic respiratory failure ([place]) 12/03/2018  . Irritability 11/09/2018  . Aggressive behavior 11/09/2018  . Agitation   . S/P right hip fracture 11/07/2017  . Closed comminuted intertrochanteric fracture of right femur ([place]) 11/07/2017  . Anxiety and depression   . Dyspnea 10/16/2017  . Anemia 10/16/2017  . COPD exacerbation ([place]) 10/07/2017  . Hyperlipidemia 10/07/2017  . Chronic respiratory failure with hypoxia ([place]) 10/07/2017  . Acute on chronic respiratory failure with hypoxia and hypercapnia ([place]) 10/07/2017  . Incontinence of urine 08/14/2011  . Hyponatremia 07/27/2011  . Schizoaffective disorder, bipolar type ([place]) 07/05/2011  . Cough 05/08/2011  . Encounter for long-term (current) use of other medications 04/09/2011  . Lumbar disc disease 04/09/2011  . Polycythemia secondary to smoking 04/09/2011  . HIP PAIN, LEFT 12/12/2007  . 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  DIABETES MELLITUS, TYPE II 04/08/2007  . MENOPAUSE, PREMATURE 04/08/2007  . Chronic hyponatremia 04/08/2007  . Schizoaffective disorder ([place]) 04/08/2007  . DEPRESSION 04/08/2007  . Essential hypertension 04/08/2007  . COPD 04/08/2007  . GERD 04/08/2007  . Osteoporosis 04/08/2007  . DYSURIA 04/08/2007    Past Surgical History:  Procedure Laterality Date  . EYE SURGERY     on left  eye, pt states that she sees bad out of the right eye and needs surgery there  . INTRAMEDULLARY (IM) NAIL INTERTROCHANTERIC Right 11/13/2017   Procedure:  INTRAMEDULLARY (IM) NAIL INTERTROCHANTRIC;  Surgeon: Shona Needles, MD;  Location: [facility];  Service: Orthopedics;  Laterality: Right;  . PELVIC FRACTURE SURGERY       OB History   No obstetric history on file.      Home Medications    Prior to Admission medications   Medication Sig Start Date End Date Taking? Authorizing Provider  acetaminophen (TYLENOL) 325 MG tablet Take 650 mg by mouth 2 (two) times daily as needed (pain). Do not exceed 3 grams/24 hours of tylenol from all sources   Yes [provider]  acetaminophen (TYLENOL) 500 MG tablet Take 500 mg by mouth every 6 (six) hours as needed for fever.   Yes [provider]  albuterol (PROVENTIL) (2.5 MG/3ML) 0.083% nebulizer solution Take 3 mLs (2.5 mg total) by nebulization every 4 (four) hours as needed for wheezing or shortness of breath. 12/11/18  Yes Swayze, Ava, DO  aluminum-magnesium hydroxide-simethicone (MAALOX) [id number] MG/5ML SUSP Take 30 mLs by mouth 4 (four) times daily as needed (heartburn, indigestion).   Yes [provider]  asenapine (SAPHRIS) 5 MG SUBL 24 hr tablet Place 1 tablet (5 mg total) under the tongue 2 (two) times daily. 12/18/18  Yes Kayleen Memos, DO  aspirin 81 MG tablet Take 1 tablet (81 mg total) by mouth daily. For heart health 08/06/11  Yes Greig Castilla, FNP  benzonatate (TESSALON) 100 MG capsule Take 1 capsule (100 mg total) by mouth 3 (three) times daily as needed for cough. 12/11/18  Yes Swayze, Ava, DO  buPROPion (WELLBUTRIN XL) 150 MG 24 hr tablet Take 150 mg by mouth daily.   Yes [provider]  calcium carbonate (TUMS - DOSED IN MG ELEMENTAL CALCIUM) 500 MG chewable tablet Chew 1 tablet (200 mg of elemental calcium total) by mouth 2 (two) times daily as needed for indigestion or heartburn. 12/11/18  Yes Swayze, Ava, DO  Calcium Carbonate-Vit D-Min (CALCIUM-VITAMIN D-MINERALS) 600-800 MG-UNIT CHEW Chew 1 tablet by mouth daily.   Yes [provider]   diazepam (VALIUM) 5 MG tablet Take 5 mg by mouth 3 (three) times daily.   Yes [provider]  docusate sodium (COLACE) 100 MG capsule Take 100 mg by mouth 2 (two) times daily.    Yes [provider]  fluticasone (FLONASE) 50 MCG/ACT nasal spray Place 2 sprays into both nostrils 2 (two) times daily.   Yes [provider]  fluticasone furoate-vilanterol (BREO ELLIPTA) 100-25 MCG/INH AEPB Inhale 1 puff into the lungs daily.   Yes [provider]  furosemide (LASIX) 20 MG tablet Take 20 mg by mouth 2 (two) times daily.   Yes [provider]  gabapentin (NEURONTIN) 100 MG capsule Take 1 capsule (100 mg total) by mouth at bedtime. 12/11/18  Yes Swayze, Ava, DO  guaiFENesin (MUCINEX) 600 MG 12 hr tablet Take 1,200 mg by mouth daily.   Yes [provider]  guaifenesin (ROBAFEN) 100  MG/5ML syrup Take 200 mg by mouth 3 (three) times daily as needed for cough or congestion.   Yes [provider]  hydrOXYzine (ATARAX/VISTARIL) 50 MG tablet Take 50 mg by mouth 3 (three) times daily.   Yes [provider]  Infant Care Products (DERMACLOUD) CREA Apply 1 application topically 3 (three) times daily.   Yes [provider]  ipratropium-albuterol (DUONEB) 0.5-2.5 (3) MG/3ML SOLN Take 3 mLs by nebulization See admin instructions. Inhale one vial via hand held nebulizer three times daily, may also use every 8 hours as needed for shortness of breath   Yes [provider]  Melatonin 5 MG TABS Take 5 mg by mouth at bedtime.   Yes [provider]  methocarbamol (ROBAXIN) 500 MG tablet Take 500 mg by mouth every 8 (eight) hours as needed for muscle spasms.   Yes [provider]  metoprolol tartrate (LOPRESSOR) 25 MG tablet Take 1 tablet (25 mg total) by mouth 2 (two) times daily. 12/17/18  Yes Hall, Carole N, DO  NIFEdipine (PROCARDIA XL/ADALAT-CC) 90 MG 24 hr tablet Take 90 mg by mouth daily.   Yes [provider]  omeprazole (PRILOSEC) 40 MG capsule Take 40 mg by mouth at bedtime.   Yes [provider]  oxyCODONE (OXY IR/ROXICODONE) 5 MG immediate release tablet Take 1 tablet (5 mg total) by mouth daily. Patient taking differently: Take 5 mg by mouth every 12 (twelve) hours as needed for moderate pain or severe pain.  12/18/18  Yes Hall, Carole N, DO  polyethylene glycol (MIRALAX) 17 g packet Take 17 g by mouth daily. Patient taking differently: Take 17 g by mouth 2 (two) times daily.  12/11/18  Yes Swayze, Ava, DO  QUEtiapine (SEROQUEL) 50 MG tablet Take 1 tablet (50 mg total) by mouth at bedtime. Patient taking differently: Take 50 mg by mouth 2 (two) times daily.  12/11/18  Yes Swayze, Ava, DO  sodium chloride 1 g tablet Take 1 tablet (1 g total) by mouth 3 (three) times daily with meals. 12/17/18  Yes Kayleen Memos, DO  valproic acid (DEPAKENE) 250 MG capsule Take 2 capsules (500 mg total) by mouth 2 (two) times daily. 12/11/18  Yes Swayze, Ava, DO  arformoterol (BROVANA) 15 MCG/2ML NEBU Take 2 mLs (15 mcg total) by nebulization 2 (two) times daily. Patient not taking: Reported on 03/09/2019 12/11/18   Swayze, Ava, DO  budesonide (PULMICORT) 0.5 MG/2ML nebulizer solution Take 2 mLs (0.5 mg total) by nebulization 2 (two) times daily. Patient not taking: Reported on 03/09/2019 12/11/18   Swayze, Ava, DO  diazepam (VALIUM) 2 MG tablet Take 1 tablet (2 mg total) by mouth 2 (two) times a day. Patient not taking: Reported on 03/09/2019 12/18/18   Kayleen Memos, DO  hydrOXYzine (ATARAX/VISTARIL) 25 MG tablet Take 1 tablet (25 mg total) by mouth 3 (three) times daily as needed for anxiety. Patient not taking: Reported on 03/09/2019 10/19/17   Aline August, MD  lurasidone (LATUDA) 40 MG TABS tablet Take 1 tablet (40 mg total) by mouth 2 (two) times a day. Patient not taking: Reported on 03/09/2019 12/18/18   Kayleen Memos, DO  nicotine (NICODERM CQ - DOSED IN MG/24 HOURS) 21 mg/24hr patch Place 1 patch (21  mg total) onto the skin daily. Patient not taking: Reported on 03/09/2019 12/12/18   Swayze, Ava, DO  omeprazole (PRILOSEC) 20 MG capsule Take 1 capsule (20 mg total) by mouth 2 (two) times daily before a meal. For  acid reflux. Patient not taking: Reported on 03/09/2019 10/19/17   Aline August, MD  QUEtiapine (SEROQUEL) 25 MG tablet Take 1 tablet (25 mg total) by mouth daily. Patient not taking: Reported on 03/09/2019 12/12/18   Swayze, Ava, DO  traZODone (DESYREL) 50 MG tablet Take 1 tablet (50 mg total) by mouth at bedtime. Patient not taking: Reported on 03/09/2019 12/11/18   Swayze, Ava, DO    Family History History reviewed. No pertinent family history.  Social History Social History   Tobacco Use  . Smoking status: Current Every Day Smoker    Packs/day: 0.25    Types: Cigarettes  . Smokeless tobacco: Never Used  Substance Use Topics  . Alcohol use: No  . Drug use: No     Allergies   Clarithromycin and Penicillins   Review of Systems Review of Systems  Constitutional: Negative for fever.  Respiratory: Negative for cough.   Cardiovascular: Positive for chest pain (intermittent left side).  Gastrointestinal: Negative for abdominal pain, diarrhea and vomiting.       Cant burp  Genitourinary: Positive for frequency.  Neurological:       Legs ache  Psychiatric/Behavioral: Positive for behavioral problems.  All other systems reviewed and are negative.    Physical Exam Updated Vital Signs BP (!) 192/64   Pulse 61   Temp 98.3 F (36.8 C) (Oral)   Resp 17   SpO2 92%   Physical Exam Vitals signs and nursing note reviewed.  Constitutional:      Appearance: She is well-developed. She is not ill-appearing or diaphoretic.  HENT:     Head: Normocephalic and atraumatic.     Right Ear: External ear normal.     Left Ear: External ear normal.  Eyes:     General: No scleral icterus.       Right eye: No discharge.        Left eye: No discharge.     Conjunctiva/sclera:  Conjunctivae normal.  Neck:     Musculoskeletal: Neck supple.     Trachea: No tracheal deviation.  Cardiovascular:     Rate and Rhythm: Normal rate and regular rhythm.  Pulmonary:     Effort: Pulmonary effort is normal. No respiratory distress.     Breath sounds: Normal breath sounds. No stridor. No wheezing or rales.  Abdominal:     General: Bowel sounds are normal. There is no distension.     Palpations: Abdomen is soft.     Tenderness: There is no abdominal tenderness. There is no guarding or rebound.  Musculoskeletal:        General: No tenderness, deformity or signs of injury.     Right lower leg: Edema present.     Left lower leg: Edema present.  Skin:    General: Skin is warm and dry.     Findings: No rash.  Neurological:     Mental Status: She is alert.     Cranial Nerves: No cranial nerve deficit (no facial droop, extraocular movements intact, no slurred speech).     Sensory: No sensory deficit.     Motor: Weakness present. No abnormal muscle tone or seizure activity.     Coordination: Coordination normal.     Comments: Generalized, greater in lower extremities  Psychiatric:        Mood and Affect: Mood is depressed. Affect is blunt.        Speech: Speech is not tangential.        Behavior: Behavior is withdrawn. Behavior is  not aggressive or hyperactive.        Thought Content: Thought content does not include homicidal or suicidal ideation.      ED Treatments / Results  Labs (all labs ordered are listed, but only abnormal results are displayed) Labs Reviewed  COMPREHENSIVE METABOLIC PANEL - Abnormal; Notable for the following components:      Result Value   Sodium 120 (*)    Chloride 83 (*)    Calcium 8.7 (*)    AST 11 (*)    All other components within normal limits  URINALYSIS, ROUTINE W REFLEX MICROSCOPIC - Abnormal; Notable for the following components:   Color, Urine STRAW (*)    APPearance HAZY (*)    Specific Gravity, Urine 1.002 (*)    Hgb urine  dipstick SMALL (*)    Nitrite POSITIVE (*)    Leukocytes,Ua LARGE (*)    Bacteria, UA MANY (*)    All other components within normal limits  CBG MONITORING, ED - Abnormal; Notable for the following components:   Glucose-Capillary 100 (*)    All other components within normal limits  URINE CULTURE  SARS CORONAVIRUS 2 (TAT 6-24 HRS)  CBC  LIPASE, BLOOD  VALPROIC ACID LEVEL  TROPONIN I (HIGH SENSITIVITY)  TROPONIN I (HIGH SENSITIVITY)    EKG EKG Interpretation  Date/Time:  Monday March 09 2019 16:44:11 EDT Ventricular Rate:  63 PR Interval:    QRS Duration: 78 QT Interval:  425 QTC Calculation: 435 R Axis:   62 Text Interpretation:  Sinus rhythm Prolonged PR interval Baseline wander in lead(s) I III aVR aVL No significant change since last tracing Confirmed by Dorie Rank [phone]) on 03/09/2019 6:48:07 PM   Radiology Dg Chest Portable 1 View  Result Date: 03/09/2019 CLINICAL DATA:  Pt arrives [facility] from [facility] for psych and medication evaluation. Rx changed in the last 2 weeks, since this- pt has been paranoid and acting differently. H/o CHF, COPD, Diabetes type 2, HTN. Smoker. EXAM: PORTABLE CHEST 1 VIEW COMPARISON:  12/05/2018 and earlier exams. FINDINGS: Cardiac silhouette is mildly enlarged. No mediastinal or hilar masses. No evidence of adenopathy. Mild opacity projects at the left lung base behind the cardiac silhouette, likely atelectasis. Lungs otherwise clear. No convincing pleural effusion.  No pneumothorax. Skeletal structures are grossly intact. IMPRESSION: No acute cardiopulmonary disease. Electronically Signed   By: Lajean Manes M.D.   On: 03/09/2019 17:07    Procedures Procedures (including critical care time)  Medications Ordered in ED Medications  sodium chloride 0.9 % bolus 500 mL (500 mLs Intravenous New Bag/Given (Non-Interop) 03/09/19 1702)    And  0.9 %  sodium chloride infusion (has no administration in time range)  ciprofloxacin (CIPRO) tablet  500 mg (has no administration in time range)  amLODipine (NORVASC) tablet 5 mg (has no administration in time range)  HYDROcodone-acetaminophen (NORCO/VICODIN) 5-325 MG per tablet 1 tablet (has no administration in time range)     Initial Impression / Assessment and Plan / ED Course  I have reviewed the triage vital signs and the nursing notes.  Pertinent labs & imaging results that were available during my care of the patient were reviewed by me and considered in my medical decision making (see chart for details).  Clinical Course as of Mar 08 1849  Mon Mar 09, 2019  [id number] Urinalysis does show positive nitrite and large leukocyte esterase.  Patient has 11-20 white blood cells and many bacteria.  This is consistent with urinary tract infection.   [  JK]  1829 Patient is also hyponatremic with a sodium of 120.  This is decreased from previous values of 130.   [id number] Chest x-ray without signs of pneumonia.  CBC and troponin are normal.   [JK]  1848 EKG without significant findings.  Patient had multiple complaints.  She did mention chest pain but I doubt her symptoms are related to acute coronary syndrome, PE or other serious etiology   [JK]    Clinical Course User Index [JK] Dorie Rank, MD     Patient presented to ED for evaluation of multiple complaints including worsening issues with her depression and anxiety.  Patient's laboratory tests are notable for hyponatremia.  She has a history of this but it is worse than her normal baseline of 130.  Patient is not showing severe signs of mental status changes.  We will proceed right now with saline infusion and free water restriction.  Patient also has dysuria and her urinalysis is consistent with a urinary tract infection.  Patient is having worsening depression and paranoia.  I do think she would benefit from psychiatry consultation but she is medically stable at this time.  I will consult with the medical service for admission and further  treatment of her hyponatremia and urinary tract infection.  Could consider a psych consult while she is in the hospital on the medical service.  Final Clinical Impressions(s) / ED Diagnoses   Final diagnoses:  Hyponatremia  Acute cystitis without hematuria      Dorie Rank, MD 03/09/19 1850

## 2019-03-09 NOTE — ED Notes (Signed)
Attempted to call report, primary nurse will call back in a few.

## 2019-03-09 NOTE — H&P (Addendum)
History and Physical    Natalie Thomas [id number] DOB: Oct 26, 1960 DOA: 03/09/2019  PCP:  Patient coming from: [place]  I have personally briefly reviewed patient's old medical records in [place]  Chief Complaint: Increased agitation, dysuria  HPI: Natalie Thomas is a 58 y.o. female with medical history significant of COPD on 2 L oxygen, hypertension, type 2 diabetes, chronic hyponatremia, schizoaffective disorder, depression who was sent from facility for concerns of increased agitation and labile mood.  Patient had an array of multiple complaints but was vague in giving history.  She appeared frustrated and irritated when answering questions.  She reports that for a while she has had more upset mood and that people in the nursing home were threatening her.  She felt that a nurse there was not giving her medication properly.Reportedly from ER nursing patient had recent changes to her Depakote and has been more paranoid.  She also reports that her "bowels have not been right" and that she has been constipated.  Unable to recall her last bowel movement.  She also complains of new dysuria.  Also noted chest pain that started today but is better because she is laying down.  She is unable to describe the characteristic, onset or duration of her pain to me.  She endorses hurting in her abdomen every day because of a hernia.    ED Course: She was afebrile but hypertensive up to 200 over 70s on 2 L of oxygen.  CBC showed no leukocytosis or anemia.  BMP showed hyponatremia with sodium of 120 down from 132 months ago.  However, it appears that patient has issue with chronic hyponatremia in the past.  Troponin of 7.  EKG showed normal sinus rhythm.  Urinalysis showed large leukocyte and positive nitrite with many bacteria.  Chest x-ray showed no acute cardiopulmonary process.  Review of Systems: Unable to obtain full review of systems as patient was agitated  and sometimes uncooperative with questions.  Past Medical History:  Diagnosis Date   Acute on chronic respiratory failure with hypoxia and hypercapnia (HCC) 10/07/2017   Anemia 10/16/2017   Anxiety and depression    CHF (congestive heart failure) (HCC)    Chronic hyponatremia 04/08/2007   Qualifier: Diagnosis of  By: Marca Ancona RMA, Lucy     Chronic respiratory failure with hypoxia ([place]) 10/07/2017   Closed comminuted intertrochanteric fracture of right femur ([place]) 11/07/2017   COPD 04/08/2007   Qualifier: Diagnosis of  By: Marca Ancona RMA, Lucy     Cough 05/08/2011   Depression    Diabetes mellitus, type 2 ([place])    pt denies but states that she has been treated for DM   Dyspnea 10/16/2017   Dysuria 04/08/2007   Qualifier: Diagnosis of  By: Reatha Armour, Lucy     Essential hypertension 04/08/2007   Qualifier: Diagnosis of  By: Marca Ancona RMA, Lucy     GERD (gastroesophageal reflux disease)    HIP PAIN, LEFT 12/12/2007   Qualifier: Diagnosis of  By: Loanne Drilling MD, Sean A    Hyperlipidemia 10/07/2017   Hypertension    Hyponatremia 07/27/2011   Incontinence of urine 08/14/2011   Lumbar disc disease 04/09/2011   MENOPAUSE, PREMATURE 04/08/2007   Qualifier: Diagnosis of  By: Marca Ancona RMA, Lucy     NEOPLASM, MALIGNANT, VULVA 04/08/2007   Qualifier: Diagnosis of  By: Marca Ancona RMA, Lucy     Osteoporosis    Osteoporosis 04/08/2007   Qualifier: Diagnosis of  By: Marca Ancona RMA,  Lucy     Polycythemia secondary to smoking 04/09/2011   S/P right hip fracture 11/07/2017   Schizoaffective disorder, bipolar type ([place]) 07/05/2011    Past Surgical History:  Procedure Laterality Date   EYE SURGERY     on left  eye, pt states that she sees bad out of the right eye and needs surgery there   INTRAMEDULLARY (IM) NAIL INTERTROCHANTERIC Right 11/13/2017   Procedure: INTRAMEDULLARY (IM) NAIL INTERTROCHANTRIC;  Surgeon: Shona Needles, MD;  Location: [facility];  Service: Orthopedics;  Laterality: Right;   PELVIC  FRACTURE SURGERY       reports that she has been smoking cigarettes. She has been smoking about 0.25 packs per day. She has never used smokeless tobacco. She reports that she does not drink alcohol or use drugs.  Allergies  Allergen Reactions   Clarithromycin Itching   Penicillins Itching    Has tolerated cefazolin before  Has patient had a PCN reaction causing immediate rash, facial/tongue/throat swelling, SOB or lightheadedness with hypotension: No Has patient had a PCN reaction causing severe rash involving mucus membranes or skin necrosis: No Has patient had a PCN reaction that required hospitalization: Unknown Has patient had a PCN reaction occurring within the last 10 years: No If all of the above answers are "NO", then may proceed with Cephalosporin use.     Family history reviewed and not pertinent   Prior to Admission medications   Medication Sig Start Date End Date Taking? Authorizing Provider  acetaminophen (TYLENOL) 325 MG tablet Take 650 mg by mouth 2 (two) times daily as needed (pain). Do not exceed 3 grams/24 hours of tylenol from all sources   Yes [provider]  acetaminophen (TYLENOL) 500 MG tablet Take 500 mg by mouth every 6 (six) hours as needed for fever.   Yes [provider]  albuterol (PROVENTIL) (2.5 MG/3ML) 0.083% nebulizer solution Take 3 mLs (2.5 mg total) by nebulization every 4 (four) hours as needed for wheezing or shortness of breath. 12/11/18  Yes Swayze, Ava, DO  aluminum-magnesium hydroxide-simethicone (MAALOX) [id number] MG/5ML SUSP Take 30 mLs by mouth 4 (four) times daily as needed (heartburn, indigestion).   Yes [provider]  asenapine (SAPHRIS) 5 MG SUBL 24 hr tablet Place 1 tablet (5 mg total) under the tongue 2 (two) times daily. 12/18/18  Yes Kayleen Memos, DO  aspirin 81 MG tablet Take 1 tablet (81 mg total) by mouth daily. For heart health 08/06/11  Yes Greig Castilla, FNP  benzonatate (TESSALON) 100 MG  capsule Take 1 capsule (100 mg total) by mouth 3 (three) times daily as needed for cough. 12/11/18  Yes Swayze, Ava, DO  buPROPion (WELLBUTRIN XL) 150 MG 24 hr tablet Take 150 mg by mouth daily.   Yes [provider]  calcium carbonate (TUMS - DOSED IN MG ELEMENTAL CALCIUM) 500 MG chewable tablet Chew 1 tablet (200 mg of elemental calcium total) by mouth 2 (two) times daily as needed for indigestion or heartburn. 12/11/18  Yes Swayze, Ava, DO  Calcium Carbonate-Vit D-Min (CALCIUM-VITAMIN D-MINERALS) 600-800 MG-UNIT CHEW Chew 1 tablet by mouth daily.   Yes [provider]  diazepam (VALIUM) 5 MG tablet Take 5 mg by mouth 3 (three) times daily.   Yes [provider]  docusate sodium (COLACE) 100 MG capsule Take 100 mg by mouth 2 (two) times daily.    Yes [provider]  fluticasone (FLONASE) 50 MCG/ACT nasal spray Place 2 sprays into both nostrils 2 (two)  times daily.   Yes [provider]  fluticasone furoate-vilanterol (BREO ELLIPTA) 100-25 MCG/INH AEPB Inhale 1 puff into the lungs daily.   Yes [provider]  furosemide (LASIX) 20 MG tablet Take 20 mg by mouth 2 (two) times daily.   Yes [provider]  gabapentin (NEURONTIN) 100 MG capsule Take 1 capsule (100 mg total) by mouth at bedtime. 12/11/18  Yes Swayze, Ava, DO  guaiFENesin (MUCINEX) 600 MG 12 hr tablet Take 1,200 mg by mouth daily.   Yes [provider]  guaifenesin (ROBAFEN) 100 MG/5ML syrup Take 200 mg by mouth 3 (three) times daily as needed for cough or congestion.   Yes [provider]  hydrOXYzine (ATARAX/VISTARIL) 50 MG tablet Take 50 mg by mouth 3 (three) times daily.   Yes [provider]  Infant Care Products (DERMACLOUD) CREA Apply 1 application topically 3 (three) times daily.   Yes [provider]  ipratropium-albuterol (DUONEB) 0.5-2.5 (3) MG/3ML SOLN Take 3 mLs by nebulization See admin instructions. Inhale one vial via hand held  nebulizer three times daily, may also use every 8 hours as needed for shortness of breath   Yes [provider]  Melatonin 5 MG TABS Take 5 mg by mouth at bedtime.   Yes [provider]  methocarbamol (ROBAXIN) 500 MG tablet Take 500 mg by mouth every 8 (eight) hours as needed for muscle spasms.   Yes [provider]  metoprolol tartrate (LOPRESSOR) 25 MG tablet Take 1 tablet (25 mg total) by mouth 2 (two) times daily. 12/17/18  Yes Hall, Carole N, DO  NIFEdipine (PROCARDIA XL/ADALAT-CC) 90 MG 24 hr tablet Take 90 mg by mouth daily.   Yes [provider]  omeprazole (PRILOSEC) 40 MG capsule Take 40 mg by mouth at bedtime.   Yes [provider]  oxyCODONE (OXY IR/ROXICODONE) 5 MG immediate release tablet Take 1 tablet (5 mg total) by mouth daily. Patient taking differently: Take 5 mg by mouth every 12 (twelve) hours as needed for moderate pain or severe pain.  12/18/18  Yes Hall, Carole N, DO  polyethylene glycol (MIRALAX) 17 g packet Take 17 g by mouth daily. Patient taking differently: Take 17 g by mouth 2 (two) times daily.  12/11/18  Yes Swayze, Ava, DO  QUEtiapine (SEROQUEL) 50 MG tablet Take 1 tablet (50 mg total) by mouth at bedtime. Patient taking differently: Take 50 mg by mouth 2 (two) times daily.  12/11/18  Yes Swayze, Ava, DO  sodium chloride 1 g tablet Take 1 tablet (1 g total) by mouth 3 (three) times daily with meals. 12/17/18  Yes Kayleen Memos, DO  valproic acid (DEPAKENE) 250 MG capsule Take 2 capsules (500 mg total) by mouth 2 (two) times daily. 12/11/18  Yes Swayze, Ava, DO  arformoterol (BROVANA) 15 MCG/2ML NEBU Take 2 mLs (15 mcg total) by nebulization 2 (two) times daily. Patient not taking: Reported on 03/09/2019 12/11/18   Swayze, Ava, DO  budesonide (PULMICORT) 0.5 MG/2ML nebulizer solution Take 2 mLs (0.5 mg total) by nebulization 2 (two) times daily. Patient not taking: Reported on 03/09/2019 12/11/18   Swayze, Ava, DO  diazepam  (VALIUM) 2 MG tablet Take 1 tablet (2 mg total) by mouth 2 (two) times a day. Patient not taking: Reported on 03/09/2019 12/18/18   Kayleen Memos, DO  hydrOXYzine (ATARAX/VISTARIL) 25 MG tablet Take 1 tablet (25 mg total) by mouth 3 (three) times daily as needed for anxiety. Patient not taking: Reported on  03/09/2019 10/19/17   Aline August, MD  lurasidone (LATUDA) 40 MG TABS tablet Take 1 tablet (40 mg total) by mouth 2 (two) times a day. Patient not taking: Reported on 03/09/2019 12/18/18   Kayleen Memos, DO  nicotine (NICODERM CQ - DOSED IN MG/24 HOURS) 21 mg/24hr patch Place 1 patch (21 mg total) onto the skin daily. Patient not taking: Reported on 03/09/2019 12/12/18   Swayze, Ava, DO  omeprazole (PRILOSEC) 20 MG capsule Take 1 capsule (20 mg total) by mouth 2 (two) times daily before a meal. For acid reflux. Patient not taking: Reported on 03/09/2019 10/19/17   Aline August, MD  QUEtiapine (SEROQUEL) 25 MG tablet Take 1 tablet (25 mg total) by mouth daily. Patient not taking: Reported on 03/09/2019 12/12/18   Swayze, Ava, DO  traZODone (DESYREL) 50 MG tablet Take 1 tablet (50 mg total) by mouth at bedtime. Patient not taking: Reported on 03/09/2019 12/11/18   Karie Kirks, DO    Physical Exam: Vitals:   03/09/19 1745 03/09/19 1800 03/09/19 1815 03/09/19 1852  BP: (!) 177/65 (!) 185/59 (!) 192/64 (!) 180/63  Pulse: 64 63 61   Resp: 16 17 17    Temp:      TempSrc:      SpO2: 96% 98% 92%     Constitutional: NAD, calm, obese female, comfortable laying in bed Vitals:   03/09/19 1745 03/09/19 1800 03/09/19 1815 03/09/19 1852  BP: (!) 177/65 (!) 185/59 (!) 192/64 (!) 180/63  Pulse: 64 63 61   Resp: 16 17 17    Temp:      TempSrc:      SpO2: 96% 98% 92%    Eyes: PERRL, lids and conjunctivae normal ENMT: Mucous membranes are moist. Posterior pharynx clear of any exudate or lesions.Normal dentition.  Neck: normal, supple, no masses,  Respiratory: mild wheezing during anterior auscultation,  no crackles. Normal respiratory effort. No accessory muscle use.  On home 2 L via nasal cannula. Cardiovascular: Regular rate and rhythm, no murmurs / rubs / gallops.  +2 pretibial edema of the lower extremity. Abdomen: Moderate tenderness to left upper and epigastric area, no masses palpated. No hepatosplenomegaly. Bowel sounds positive.  Musculoskeletal: no clubbing / cyanosis. No joint deformity upper and lower extremities. Good ROM, no contractures. Normal muscle tone.  Skin: no rashes, lesions, ulcers. No induration Neurologic: CN 2-12 grossly intact. Sensation intact, DTR normal. Strength 5/5 in all 4-endorsed pain to hip area when legs are elevated.  Psychiatric: Normal judgment and insight. Alert and oriented x 3.  Irritable and frustrated.    Labs on Admission: I have personally reviewed following labs and imaging studies  CBC: Recent Labs  Lab 03/09/19 1654  WBC 9.7  HGB 14.2  HCT 43.0  MCV 89.2  PLT 123456   Basic Metabolic Panel: Recent Labs  Lab 03/09/19 1654  NA 120*  K 4.2  CL 83*  CO2 28  GLUCOSE 90  BUN 7  CREATININE 0.55  CALCIUM 8.7*   GFR: CrCl cannot be calculated (Unknown ideal weight.). Liver Function Tests: Recent Labs  Lab 03/09/19 1654  AST 11*  ALT 7  ALKPHOS 80  BILITOT 0.4  PROT 7.5  ALBUMIN 3.5   Recent Labs  Lab 03/09/19 1654  LIPASE 31   No results for input(s): AMMONIA in the last 168 hours. Coagulation Profile: No results for input(s): INR, PROTIME in the last 168 hours. Cardiac Enzymes: No results for input(s): CKTOTAL, CKMB, CKMBINDEX, TROPONINI in the last 168 hours. BNP (  last 3 results) No results for input(s): PROBNP in the last 8760 hours. HbA1C: No results for input(s): HGBA1C in the last 72 hours. CBG: Recent Labs  Lab 03/09/19 1645  GLUCAP 100*   Lipid Profile: No results for input(s): CHOL, HDL, LDLCALC, TRIG, CHOLHDL, LDLDIRECT in the last 72 hours. Thyroid Function Tests: No results for input(s): TSH,  T4TOTAL, FREET4, T3FREE, THYROIDAB in the last 72 hours. Anemia Panel: No results for input(s): VITAMINB12, FOLATE, FERRITIN, TIBC, IRON, RETICCTPCT in the last 72 hours. Urine analysis:    Component Value Date/Time   COLORURINE STRAW (A) 03/09/2019 1655   APPEARANCEUR HAZY (A) 03/09/2019 1655   APPEARANCEUR Clear 06/22/2012 0618   LABSPEC 1.002 (L) 03/09/2019 1655   LABSPEC 1.003 06/22/2012 0618   PHURINE 7.0 03/09/2019 1655   GLUCOSEU NEGATIVE 03/09/2019 1655   GLUCOSEU Negative 06/22/2012 0618   GLUCOSEU NEGATIVE 04/09/2011 1058   HGBUR SMALL (A) 03/09/2019 1655   BILIRUBINUR NEGATIVE 03/09/2019 1655   BILIRUBINUR Negative 06/22/2012 0618   KETONESUR NEGATIVE 03/09/2019 1655   PROTEINUR NEGATIVE 03/09/2019 1655   UROBILINOGEN 1.0 08/18/2011 1805   NITRITE POSITIVE (A) 03/09/2019 1655   LEUKOCYTESUR LARGE (A) 03/09/2019 1655   LEUKOCYTESUR Trace 06/22/2012 0618    Radiological Exams on Admission: Dg Chest Portable 1 View  Result Date: 03/09/2019 CLINICAL DATA:  Pt arrives [facility] from [facility] for psych and medication evaluation. Rx changed in the last 2 weeks, since this- pt has been paranoid and acting differently. H/o CHF, COPD, Diabetes type 2, HTN. Smoker. EXAM: PORTABLE CHEST 1 VIEW COMPARISON:  12/05/2018 and earlier exams. FINDINGS: Cardiac silhouette is mildly enlarged. No mediastinal or hilar masses. No evidence of adenopathy. Mild opacity projects at the left lung base behind the cardiac silhouette, likely atelectasis. Lungs otherwise clear. No convincing pleural effusion.  No pneumothorax. Skeletal structures are grossly intact. IMPRESSION: No acute cardiopulmonary disease. Electronically Signed   By: Lajean Manes M.D.   On: 03/09/2019 17:07    EKG: Independently reviewed.   Assessment/Plan  Increasing agitation and labile mood with history of schizoaffective disorder/depression/anxiety -continue Saphris, wellbutrin, Valium, hydroxyzine, seroquel, tradzone,  depokate - suspect poly-pharmacy-pt on numerous sedating psychiatric meds. Will consult psych to evaluate in the morning for further recommendation.  - check depakote level  Hyponatremia - Chronic but usually baseline around 125-130 range. Possibly due to polypharmacy? or decrease PO? - has received 500cc in ED and will repeat BMP. Actually appears volume overloaded with +2 pitting on exam. - continue home sodium tablet  - Urine osm, sodium labs  Urinary tract infection - given Cipro in ED. Will start Rocephin  Hypertension -Patient was quite hypertensive on admission and is unsure if she is still taking her antihypertensives -We will continue home nifedipine, metoprolol, Lasix  Constipation - continue bowel regimen with Miralax, colace  COPD -Stable. Continue home 2L      DVT prophylaxis: Lovenox  Code Status: Full Family Communication: Plan discussed with patient at bedside  disposition Plan: Back to assisted nursing facility with 2 midnight stay given psychiatric evaluation  Consults called: Psychiatry  Admission status: Inpatient    Alizah Sills T Danielle Mink DO [facility]   If 7PM-7AM, please contact night-coverage [email or web address] Password [facility]  03/09/2019, 7:19 PM

## 2019-03-09 NOTE — ED Notes (Signed)
Attempted to start another IV but unsuccessful.

## 2019-03-09 NOTE — ED Notes (Signed)
Mandy, RN is going to attempt an IV.

## 2019-03-09 NOTE — ED Triage Notes (Signed)
Pt arrives [facility] from [facility] for psych and medication evaluation. Depakote Rx was changed in the last 2 weeks, since this- pt has been paranoid and acting differently.

## 2019-03-09 NOTE — ED Notes (Signed)
Mandy, RN was unsuccessful starting an IV. Will place an IV team consult order.

## 2019-03-10 ENCOUNTER — Inpatient Hospital Stay (HOSPITAL_COMMUNITY): Payer: Medicare Other

## 2019-03-10 ENCOUNTER — Other Ambulatory Visit: Payer: Self-pay

## 2019-03-10 DIAGNOSIS — F600 Paranoid personality disorder: Secondary | ICD-10-CM

## 2019-03-10 DIAGNOSIS — R109 Unspecified abdominal pain: Secondary | ICD-10-CM

## 2019-03-10 DIAGNOSIS — F250 Schizoaffective disorder, bipolar type: Secondary | ICD-10-CM

## 2019-03-10 DIAGNOSIS — N390 Urinary tract infection, site not specified: Secondary | ICD-10-CM

## 2019-03-10 DIAGNOSIS — A499 Bacterial infection, unspecified: Secondary | ICD-10-CM

## 2019-03-10 LAB — COMPREHENSIVE METABOLIC PANEL
ALT: 7 U/L (ref 0–44)
AST: 8 U/L — ABNORMAL LOW (ref 15–41)
Albumin: 3.5 g/dL (ref 3.5–5.0)
Alkaline Phosphatase: 82 U/L (ref 38–126)
Anion gap: 11 (ref 5–15)
BUN: 6 mg/dL (ref 6–20)
CO2: 29 mmol/L (ref 22–32)
Calcium: 9.1 mg/dL (ref 8.9–10.3)
Chloride: 88 mmol/L — ABNORMAL LOW (ref 98–111)
Creatinine, Ser: 0.53 mg/dL (ref 0.44–1.00)
GFR calc Af Amer: >60 mL/min (ref >60)
GFR calc non Af Amer: >60 mL/min (ref >60)
Glucose, Bld: 113 mg/dL — ABNORMAL HIGH (ref 70–99)
Potassium: 5.1 mmol/L (ref 3.5–5.1)
Sodium: 128 mmol/L — ABNORMAL LOW (ref 135–145)
Total Bilirubin: 0.3 mg/dL (ref 0.3–1.2)
Total Protein: 7.2 g/dL (ref 6.5–8.1)

## 2019-03-10 LAB — CBC
HCT: 41.3 % (ref 36.0–46.0)
Hemoglobin: 13.2 g/dL (ref 12.0–15.0)
MCH: 28.8 pg (ref 26.0–34.0)
MCHC: 32 g/dL (ref 30.0–36.0)
MCV: 90.2 fL (ref 80.0–100.0)
Platelets: 191 10*3/uL (ref 150–400)
RBC: 4.58 MIL/uL (ref 3.87–5.11)
RDW: 13.5 % (ref 11.5–15.5)
WBC: 5.4 10*3/uL (ref 4.0–10.5)
nRBC: 0 % (ref 0.0–0.2)

## 2019-03-10 LAB — MAGNESIUM: Magnesium: 2 mg/dL (ref 1.7–2.4)

## 2019-03-10 LAB — PHOSPHORUS: Phosphorus: 5.3 mg/dL — ABNORMAL HIGH (ref 2.5–4.6)

## 2019-03-10 LAB — SARS CORONAVIRUS 2 (TAT 6-24 HRS): SARS Coronavirus 2: NEGATIVE

## 2019-03-10 LAB — OSMOLALITY: Osmolality: 257 mOsm/kg — ABNORMAL LOW (ref 275–295)

## 2019-03-10 LAB — VALPROIC ACID LEVEL: Valproic Acid Lvl: 64 ug/mL (ref 50.0–100.0)

## 2019-03-10 MED ORDER — TRAZODONE HCL 50 MG PO TABS
50.0000 mg | ORAL_TABLET | Freq: Every evening | ORAL | Status: DC | PRN
  Administered 2019-03-10 – 2019-03-15 (×6): 50 mg via ORAL
  Filled 2019-03-10 (×6): qty 1

## 2019-03-10 MED ORDER — ENSURE ENLIVE PO LIQD
237.0000 mL | ORAL | Status: DC
  Administered 2019-03-10 – 2019-03-12 (×3): 237 mL via ORAL

## 2019-03-10 MED ORDER — ORAL CARE MOUTH RINSE
15.0000 mL | Freq: Two times a day (BID) | OROMUCOSAL | Status: DC
  Administered 2019-03-10 – 2019-03-16 (×8): 15 mL via OROMUCOSAL

## 2019-03-10 MED ORDER — ADULT MULTIVITAMIN W/MINERALS CH
1.0000 | ORAL_TABLET | Freq: Every day | ORAL | Status: DC
  Administered 2019-03-10 – 2019-03-16 (×7): 1 via ORAL
  Filled 2019-03-10 (×7): qty 1

## 2019-03-10 MED ORDER — ASENAPINE MALEATE 5 MG SL SUBL
10.0000 mg | SUBLINGUAL_TABLET | Freq: Two times a day (BID) | SUBLINGUAL | Status: DC
  Administered 2019-03-10 – 2019-03-16 (×12): 10 mg via SUBLINGUAL
  Filled 2019-03-10 (×13): qty 2

## 2019-03-10 MED ORDER — PRO-STAT SUGAR FREE PO LIQD
30.0000 mL | Freq: Two times a day (BID) | ORAL | Status: DC
  Administered 2019-03-10 – 2019-03-16 (×9): 30 mL via ORAL
  Filled 2019-03-10 (×9): qty 30

## 2019-03-10 NOTE — Consult Note (Addendum)
Telepsych Consultation   Reason for Consult:  "increase agitation, depression- recent changes in Depakote. Has schizoaffective disorder." Referring Physician: Dr. Raiford Noble Location of Patient: [id number] Location of Provider: [facility]  Patient Identification: Natalie Thomas MRN:  [id number] Principal Diagnosis: Schizoaffective disorder, bipolar type ([place]) Diagnosis:  Active Problems:   Urinary tract bacterial infections   Total Time spent with patient: 1 hour  Subjective:   Natalie Thomas is a 58 y.o. female patient admitted with UTI and hyponatremia.  HPI:   Per chart review, patient presented to the hospital with increased agitation and labile mood from her SNF. She reports paranoid ideas that the people at her nursing home were threatening her. Medication changes were recently made to her Depakote and she has been more paranoid since these changes were made. Home medications include Saphris 5 mg BID, Wellbutrin XL 150 mg daily, Valium 5 mg TID, Seroquel 50 mg qhs, Trazodone 50 mg qhs PRN, Melatonin 5 mg qhs, Depakote 500 mg BID, Gabapentin 100 mg qhs and Atarax 50 mg TID. She was found to have a UTI and hyponatremia (current sodium is 128 and was 120 on admission). She is receiving IV Rocephin.  On interview, Natalie Thomas reports that she is not doing well. She reports, "I have a lot of paranoia, attitude and hollering going on." She reports believing that someone will harm her. She does not believe that staff want her to continue to be a resident at her SNF. She is unable to explain why she feels this way. She reports recent medication changes. She reports no problems with sleep but does report feelings of restlessness. She reports poor appetite. She denies SI, HI or AVH. She is followed by her psychiatrist, Dr. Lucita Ferrara at her SNF. She also regularly sees her therapist, Natalie Thomas.    Past Psychiatric History: Schizoaffective disorder, bipolar type, anxiety and  depression. History of one suicide attempt in 2012 by cutting her wrist.   Risk to Self:  None. Denies SI.  Risk to Others:  None. Denies HI.  Prior Inpatient Therapy:   She has a history of prior hospitalizations including [facility].  Prior Outpatient Therapy:  She is followed by her psychiatrist, Dr. Lucita Ferrara.  Past Medical History:  Past Medical History:  Diagnosis Date  . Acute on chronic respiratory failure with hypoxia and hypercapnia ([place]) 10/07/2017  . Anemia 10/16/2017  . Anxiety and depression   . CHF (congestive heart failure) ([place])   . Chronic hyponatremia 04/08/2007   Qualifier: Diagnosis of  By: Marca Ancona RMA, Lucy    . Chronic respiratory failure with hypoxia ([place]) 10/07/2017  . Closed comminuted intertrochanteric fracture of right femur ([place]) 11/07/2017  . COPD 04/08/2007   Qualifier: Diagnosis of  By: Marca Ancona RMA, Lucy    . Cough 05/08/2011  . Depression   . Diabetes mellitus, type 2 ([place])    pt denies but states that she has been treated for DM  . Dyspnea 10/16/2017  . Dysuria 04/08/2007   Qualifier: Diagnosis of  By: Reatha Armour, Lucy    . Essential hypertension 04/08/2007   Qualifier: Diagnosis of  By: Marca Ancona RMA, Lucy    . GERD (gastroesophageal reflux disease)   . HIP PAIN, LEFT 12/12/2007   Qualifier: Diagnosis of  By: Loanne Drilling MD, Jacelyn Pi   . Hyperlipidemia 10/07/2017  . Hypertension   . Hyponatremia 07/27/2011  . Incontinence of urine 08/14/2011  . Lumbar disc disease 04/09/2011  . MENOPAUSE, PREMATURE 04/08/2007   Qualifier: Diagnosis of  By: Larose Kells    . 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 Osteoporosis   . Osteoporosis 04/08/2007   Qualifier: Diagnosis of  By: Reatha Armour, Lucy    . Polycythemia secondary to smoking 04/09/2011  . S/P right hip fracture 11/07/2017  . Schizoaffective disorder, bipolar type ([place]) 07/05/2011    Past Surgical History:  Procedure Laterality Date  . EYE SURGERY     on left  eye, pt  states that she sees bad out of the right eye and needs surgery there  . INTRAMEDULLARY (IM) NAIL INTERTROCHANTERIC Right 11/13/2017   Procedure: INTRAMEDULLARY (IM) NAIL INTERTROCHANTRIC;  Surgeon: Shona Needles, MD;  Location: [facility];  Service: Orthopedics;  Laterality: Right;  . PELVIC FRACTURE SURGERY     Family History: History reviewed. No pertinent family history. Family Psychiatric  History: Denies  Social History:  Social History   Substance and Sexual Activity  Alcohol Use No     Social History   Substance and Sexual Activity  Drug Use No    Social History   Socioeconomic History  . Marital status: Divorced    Spouse name: Not on file  . Number of children: Not on file  . Years of education: Not on file  . Highest education level: Not on file  Occupational History  . Occupation: Disabled  Social Needs  . Financial resource strain: Not on file  . Food insecurity    Worry: Not on file    Inability: Not on file  . Transportation needs    Medical: Not on file    Non-medical: Not on file  Tobacco Use  . Smoking status: Current Every Day Smoker    Packs/day: 0.25    Types: Cigarettes  . Smokeless tobacco: Never Used  Substance and Sexual Activity  . Alcohol use: No  . Drug use: No  . Sexual activity: Never  Lifestyle  . Physical activity    Days per week: Not on file    Minutes per session: Not on file  . Stress: Not on file  Relationships  . Social Herbalist on phone: Not on file    Gets together: Not on file    Attends religious service: Not on file    Active member of club or organization: Not on file    Attends meetings of clubs or organizations: Not on file    Relationship status: Not on file  Other Topics Concern  . Not on file  Social History Narrative   single   Additional Social History: She is a resident at [facility] x 1 year. She denies alcohol or illicit substance use.     Allergies:   Allergies  Allergen Reactions   . Clarithromycin Itching  . Penicillins Itching    Has tolerated cefazolin before  Has patient had a PCN reaction causing immediate rash, facial/tongue/throat swelling, SOB or lightheadedness with hypotension: No Has patient had a PCN reaction causing severe rash involving mucus membranes or skin necrosis: No Has patient had a PCN reaction that required hospitalization: Unknown Has patient had a PCN reaction occurring within the last 10 years: No If all of the above answers are "NO", then may proceed with Cephalosporin use.     Labs:  Results for orders placed or performed during the hospital encounter of 03/09/19 (from the past 48 hour(s))  CBG monitoring, ED     Status: Abnormal  Collection Time: 03/09/19  4:45 PM  Result Value Ref Range   Glucose-Capillary 100 (H) 70 - 99 mg/dL  Troponin I (High Sensitivity)     Status: None   Collection Time: 03/09/19  4:54 PM  Result Value Ref Range   Troponin I (High Sensitivity) 7 <18 ng/L    Comment: (NOTE) Elevated high sensitivity troponin I (hsTnI) values and significant  changes across serial measurements may suggest ACS but many other  chronic and acute conditions are known to elevate hsTnI results.  Refer to the "Links" section for chest pain algorithms and additional  guidance. Performed at [facility], [place] [address], [place], [place] [address]   CBC     Status: None   Collection Time: 03/09/19  4:54 PM  Result Value Ref Range   WBC 9.7 4.0 - 10.5 K/uL   RBC 4.82 3.87 - 5.11 MIL/uL   Hemoglobin 14.2 12.0 - 15.0 g/dL   HCT 43.0 36.0 - 46.0 %   MCV 89.2 80.0 - 100.0 fL   MCH 29.5 26.0 - 34.0 pg   MCHC 33.0 30.0 - 36.0 g/dL   RDW 13.5 11.5 - 15.5 %   Platelets 230 150 - 400 K/uL   nRBC 0.0 0.0 - 0.2 %    Comment: Performed at [facility], [place] [address] [place], [place] [address]  Comprehensive metabolic panel     Status: Abnormal   Collection Time: 03/09/19  4:54 PM  Result Value  Ref Range   Sodium 120 (L) 135 - 145 mmol/L   Potassium 4.2 3.5 - 5.1 mmol/L   Chloride 83 (L) 98 - 111 mmol/L   CO2 28 22 - 32 mmol/L   Glucose, Bld 90 70 - 99 mg/dL   BUN 7 6 - 20 mg/dL   Creatinine, Ser 0.55 0.44 - 1.00 mg/dL   Calcium 8.7 (L) 8.9 - 10.3 mg/dL   Total Protein 7.5 6.5 - 8.1 g/dL   Albumin 3.5 3.5 - 5.0 g/dL   AST 11 (L) 15 - 41 U/L   ALT 7 0 - 44 U/L   Alkaline Phosphatase 80 38 - 126 U/L   Total Bilirubin 0.4 0.3 - 1.2 mg/dL   GFR calc non Af Amer >60 >60 mL/min   GFR calc Af Amer >60 >60 mL/min   Anion gap 9 5 - 15    Comment: Performed at [facility], [place] [address], [place], [place] [address]  Lipase, blood     Status: None   Collection Time: 03/09/19  4:54 PM  Result Value Ref Range   Lipase 31 11 - 51 U/L    Comment: Performed at [facility], [place] [address] [place], [place] [address]  Valproic acid level     Status: None   Collection Time: 03/09/19  4:54 PM  Result Value Ref Range   Valproic Acid Lvl 52 50.0 - 100.0 ug/mL    Comment: Performed at [facility], [place] [address], [place], [place] [address]  Urinalysis, Routine w reflex microscopic     Status: Abnormal   Collection Time: 03/09/19  4:55 PM  Result Value Ref Range   Color, Urine STRAW (A) YELLOW   APPearance HAZY (A) CLEAR   Specific Gravity, Urine 1.002 (L) 1.005 - 1.030   pH 7.0 5.0 - 8.0   Glucose, UA NEGATIVE NEGATIVE mg/dL   Hgb urine dipstick SMALL (A) NEGATIVE   Bilirubin Urine NEGATIVE NEGATIVE   Ketones,  ur NEGATIVE NEGATIVE mg/dL   Protein, ur NEGATIVE NEGATIVE mg/dL   Nitrite POSITIVE (A) NEGATIVE   Leukocytes,Ua LARGE (A) NEGATIVE   RBC / HPF 0-5 0 - 5 RBC/hpf   WBC, UA 11-20 0 - 5 WBC/hpf   Bacteria, UA MANY (A) NONE SEEN   Squamous Epithelial / LPF 0-5 0 - 5    Comment: Performed at [facility], [place] [address] [place], [place] [address]  Troponin I (High Sensitivity)     Status: None    Collection Time: 03/09/19  6:37 PM  Result Value Ref Range   Troponin I (High Sensitivity) 7 <18 ng/L    Comment: (NOTE) Elevated high sensitivity troponin I (hsTnI) values and significant  changes across serial measurements may suggest ACS but many other  chronic and acute conditions are known to elevate hsTnI results.  Refer to the "Links" section for chest pain algorithms and additional  guidance. Performed at [facility], [place] [address] [place], [place] [address]   SARS CORONAVIRUS 2 (TAT 6-24 HRS) Nasopharyngeal Nasopharyngeal Swab     Status: None   Collection Time: 03/09/19  6:47 PM   Specimen: Nasopharyngeal Swab  Result Value Ref Range   SARS Coronavirus 2 NEGATIVE NEGATIVE    Comment: (NOTE) SARS-CoV-2 target nucleic acids are NOT DETECTED. The SARS-CoV-2 RNA is generally detectable in upper and lower respiratory specimens during the acute phase of infection. Negative results do not preclude SARS-CoV-2 infection, do not rule out co-infections with other pathogens, and should not be used as the sole basis for treatment or other patient management decisions. Negative results must be combined with clinical observations, patient history, and epidemiological information. The expected result is Negative. Fact Sheet for Patients: SugarRoll.be Fact Sheet for Healthcare Providers: https://www.woods-mathews.com/ This test is not yet approved or cleared by the [place] FDA and  has been authorized for detection and/or diagnosis of SARS-CoV-2 by FDA under an Emergency Use Authorization (EUA). This EUA will remain  in effect (meaning this test can be used) for the duration of the COVID-19 declaration under Section 56 4(b)(1) of the Act, 21 U.S.C. section 360bbb-3(b)(1), unless the authorization is terminated or revoked sooner. Performed at [facility], [place] [address], [place], [place] [address]    Osmolality, urine     Status: Abnormal   Collection Time: 03/09/19  7:35 PM  Result Value Ref Range   Osmolality, Ur 106 (L) 300 - 900 mOsm/kg    Comment: Performed at [place] [address] [place], [place] [address]  Sodium, urine, random     Status: None   Collection Time: 03/09/19  7:35 PM  Result Value Ref Range   Sodium, Ur 39 mmol/L    Comment: Performed at [facility] [place] [address], [place], [place] [id number]  Basic metabolic panel     Status: Abnormal   Collection Time: 03/09/19  9:21 PM  Result Value Ref Range   Sodium 123 (L) 135 - 145 mmol/L   Potassium 4.6 3.5 - 5.1 mmol/L   Chloride 86 (L) 98 - 111 mmol/L   CO2 28 22 - 32 mmol/L   Glucose, Bld 81 70 - 99 mg/dL   BUN 6 6 - 20 mg/dL   Creatinine, Ser 0.49 0.44 - 1.00 mg/dL   Calcium 8.8 (L) 8.9 - 10.3 mg/dL   GFR calc non Af Amer >60 >60 mL/min   GFR calc Af Amer >60 >60 mL/min  Anion gap 9 5 - 15    Comment: Performed at [facility], [place] [address], [place], [place] [address]  Osmolality     Status: Abnormal   Collection Time: 03/09/19  9:22 PM  Result Value Ref Range   Osmolality 257 (L) 275 - 295 mOsm/kg    Comment: Performed at [facility], [place] [address] [place], [place] [address]  CBC     Status: None   Collection Time: 03/10/19  2:54 AM  Result Value Ref Range   WBC 5.4 4.0 - 10.5 K/uL   RBC 4.58 3.87 - 5.11 MIL/uL   Hemoglobin 13.2 12.0 - 15.0 g/dL   HCT 41.3 36.0 - 46.0 %   MCV 90.2 80.0 - 100.0 fL   MCH 28.8 26.0 - 34.0 pg   MCHC 32.0 30.0 - 36.0 g/dL   RDW 13.5 11.5 - 15.5 %   Platelets 191 150 - 400 K/uL   nRBC 0.0 0.0 - 0.2 %    Comment: Performed at [facility], [place] [address], [place], [place] [address]  Valproic acid level     Status: None   Collection Time: 03/10/19  2:54 AM  Result Value Ref Range   Valproic Acid Lvl 64 50.0 - 100.0 ug/mL    Comment: Performed at [facility], [place]  [address] [place], [place] [address]  Comprehensive metabolic panel     Status: Abnormal   Collection Time: 03/10/19  8:50 AM  Result Value Ref Range   Sodium 128 (L) 135 - 145 mmol/L   Potassium 5.1 3.5 - 5.1 mmol/L   Chloride 88 (L) 98 - 111 mmol/L   CO2 29 22 - 32 mmol/L   Glucose, Bld 113 (H) 70 - 99 mg/dL   BUN 6 6 - 20 mg/dL   Creatinine, Ser 0.53 0.44 - 1.00 mg/dL   Calcium 9.1 8.9 - 10.3 mg/dL   Total Protein 7.2 6.5 - 8.1 g/dL   Albumin 3.5 3.5 - 5.0 g/dL   AST 8 (L) 15 - 41 U/L   ALT 7 0 - 44 U/L   Alkaline Phosphatase 82 38 - 126 U/L   Total Bilirubin 0.3 0.3 - 1.2 mg/dL   GFR calc non Af Amer >60 >60 mL/min   GFR calc Af Amer >60 >60 mL/min   Anion gap 11 5 - 15    Comment: Performed at [facility], [place] [address] [place], [place] [address]  Magnesium     Status: None   Collection Time: 03/10/19  8:50 AM  Result Value Ref Range   Magnesium 2.0 1.7 - 2.4 mg/dL    Comment: Performed at [facility], [place] [address] [place] [address], [place], [place] [address]  Phosphorus     Status: Abnormal   Collection Time: 03/10/19  8:50 AM  Result Value Ref Range   Phosphorus 5.3 (H) 2.5 - 4.6 mg/dL    Comment: Performed at [facility], [place] [address], [place], [place] [address]    Medications:  Current Facility-Administered Medications  Medication Dose Route Frequency Provider Last Rate Last Dose  . 0.9 %  sodium chloride infusion   Intravenous PRN Tu, Ching T, DO 10 mL/hr at 03/10/19 0005 1,000 mL at 03/10/19 0005  . acetaminophen (TYLENOL) tablet 650 mg  650 mg Oral Q6H PRN Tu, Ching T, DO      . albuterol (PROVENTIL) (2.5 MG/3ML) 0.083% nebulizer solution 2.5 mg  2.5 mg Nebulization  Q4H PRN Ileene Musa T, DO   2.5 mg at 03/09/19 2157  . alum & mag hydroxide-simeth (MAALOX/MYLANTA) 200-200-20 MG/5ML suspension 30 mL  30 mL Oral QID PRN Tu, Ching T, DO   30 mL at 03/10/19 1223  . asenapine (SAPHRIS) sublingual tablet 5 mg  5 mg  Sublingual BID Tu, Ching T, DO   5 mg at 03/10/19 1012  . aspirin chewable tablet 81 mg  81 mg Oral Daily Tu, Ching T, DO   81 mg at 03/10/19 1011  . benzonatate (TESSALON) capsule 100 mg  100 mg Oral TID PRN Tu, Ching T, DO      . buPROPion (WELLBUTRIN XL) 24 hr tablet 150 mg  150 mg Oral Daily Tu, Ching T, DO   150 mg at 03/10/19 1011  . calcium carbonate (TUMS - dosed in mg elemental calcium) chewable tablet 200 mg of elemental calcium  1 tablet Oral BID PRN Tu, Ching T, DO      . cefTRIAXone (ROCEPHIN) 1 g in sodium chloride 0.9 % 100 mL IVPB  1 g Intravenous Q24H Tu, Ching T, DO 200 mL/hr at 03/10/19 0010 1 g at 03/10/19 0010  . diazepam (VALIUM) tablet 5 mg  5 mg Oral TID Tu, Ching T, DO   5 mg at 03/10/19 1011  . docusate sodium (COLACE) capsule 100 mg  100 mg Oral BID Tu, Ching T, DO   100 mg at 03/10/19 1011  . enoxaparin (LOVENOX) injection 40 mg  40 mg Subcutaneous Q24H Tu, Ching T, DO   40 mg at 03/09/19 2350  . feeding supplement (ENSURE ENLIVE) (ENSURE ENLIVE) liquid 237 mL  237 mL Oral Q24H Sheikh, Omair Latif, DO      . feeding supplement (PRO-STAT SUGAR FREE 64) liquid 30 mL  30 mL Oral BID Sheikh, Omair Latif, DO      . fluticasone furoate-vilanterol (BREO ELLIPTA) 100-25 MCG/INH 1 puff  1 puff Inhalation Daily Tu, Ching T, DO   1 puff at 03/10/19 0806  . furosemide (LASIX) tablet 20 mg  20 mg Oral BID Minda Ditto, RPH   20 mg at 03/10/19 0758  . gabapentin (NEURONTIN) capsule 100 mg  100 mg Oral QHS Tu, Ching T, DO   100 mg at 03/10/19 0002  . hydrOXYzine (ATARAX/VISTARIL) tablet 50 mg  50 mg Oral TID Tu, Ching T, DO   50 mg at 03/10/19 1012  . MEDLINE mouth rinse  15 mL Mouth Rinse BID Tu, Ching T, DO   15 mL at 03/10/19 1012  . Melatonin TABS 5 mg  5 mg Oral QHS Tu, Ching T, DO   5 mg at 03/09/19 2344  . metoprolol tartrate (LOPRESSOR) tablet 25 mg  25 mg Oral BID Tu, Ching T, DO   25 mg at 03/10/19 1011  . multivitamin with minerals tablet 1 tablet  1 tablet Oral Daily  Sheikh, Georgina Quint Latif, DO      . NIFEdipine (PROCARDIA-XL/NIFEDICAL-XL) 24 hr tablet 90 mg  90 mg Oral Daily Tu, Ching T, DO   90 mg at 03/10/19 1012  . oxyCODONE (Oxy IR/ROXICODONE) immediate release tablet 5 mg  5 mg Oral Q12H PRN Tu, Ching T, DO   5 mg at 03/10/19 0758  . pantoprazole (PROTONIX) EC tablet 40 mg  40 mg Oral Daily Tu, Ching T, DO   40 mg at 03/10/19 1011  . polyethylene glycol (MIRALAX / GLYCOLAX) packet 17 g  17 g Oral BID Tu, Ching T, DO  17 g at 03/10/19 1013  . QUEtiapine (SEROQUEL) tablet 50 mg  50 mg Oral QHS Tu, Ching T, DO   50 mg at 03/09/19 2347  . sodium chloride tablet 1 g  1 g Oral TID WC Tu, Ching T, DO   1 g at 03/10/19 1219  . traZODone (DESYREL) tablet 50 mg  50 mg Oral QHS PRN Schorr, Rhetta Mura, NP   50 mg at 03/10/19 0048  . valproic acid (DEPAKENE) 250 MG capsule 500 mg  500 mg Oral BID Tu, Ching T, DO   500 mg at 03/10/19 1012    Musculoskeletal: Strength & Muscle Tone: No atrophy noted. Gait & Station: UTA since patient is lying in bed. Patient leans: N/A  Psychiatric Specialty Exam: Physical Exam  Nursing note and vitals reviewed. Constitutional: She is oriented to person, place, and time. She appears well-developed and well-nourished.  HENT:  Head: Normocephalic and atraumatic.  Neck: Normal range of motion.  Respiratory: Effort normal.  Musculoskeletal: Normal range of motion.  Neurological: She is alert and oriented to person, place, and time.  Psychiatric: Her speech is normal and behavior is normal. Judgment and thought content normal. Her mood appears anxious. Cognition and memory are normal.    Review of Systems  Gastrointestinal: Positive for nausea. Negative for abdominal pain, blood in stool, constipation, diarrhea and vomiting.  Musculoskeletal:       Chronic generalized pain  Psychiatric/Behavioral: Negative for hallucinations, substance abuse and suicidal ideas. The patient does not have insomnia.   All other systems reviewed and  are negative.   Blood pressure 138/71, pulse (!) 59, temperature 97.6 F (36.4 C), temperature source Oral, resp. rate 14, weight 89.4 kg, SpO2 96 %.Body mass index is 34.91 kg/m.  General Appearance: Fairly Groomed, middle aged, Caucasian female, wearing a hospital gown with nasal cannula in place who is lying in bed. NAD.   Eye Contact:  Good  Speech:  Clear and Coherent and Normal Rate  Volume:  Normal  Mood:  Anxious  Affect:  Constricted  Thought Process:  Goal Directed, Linear and Descriptions of Associations: Intact  Orientation:  Full (Time, Place, and Person)  Thought Content:  Logical and Paranoid Ideation  Suicidal Thoughts:  No  Homicidal Thoughts:  No  Memory:  Immediate;   Good Recent;   Good Remote;   Good  Judgement:  Fair  Insight:  Fair  Psychomotor Activity:  Normal  Concentration:  Concentration: Good and Attention Span: Good  Recall:  Good  Fund of Knowledge:  Good  Language:  Good  Akathisia:  No  Handed:  Right  AIMS (if indicated):   N/A  Assets:  Communication Skills Desire for Improvement Housing Resilience  ADL's:  Intact  Cognition:  WNL  Sleep:   Okay   Assessment:  Natalie Thomas is a 58 y.o. female who was admitted with UTI and hyponatremia. Patient endorses mood lability and paranoia over the past month in the setting of medication changes and recent UTI. She also endorses mood lability. Recommend increasing Saphris for mood stabilization/paranoia. She denies SI, HI or AVH. She does not appear to be responding to internal stimuli. She does not warrant inpatient psychiatric hospitalization at this time. She should continue to follow up with her outpatient provider at her living facility.   Treatment Plan Summary: -Increase Saphris 5 mg BID to 10 mg BID for mood stabilization/paranoia.  -EKG reviewed and QTc 440 on 9/14. Please closely monitor when starting or increasing QTc  prolonging agents.   -Psychiatry will sign off on patient at this  time. Please consult psychiatry again as needed.   Disposition: No evidence of imminent risk to self or others at present.   Patient does not meet criteria for psychiatric inpatient admission.  This service was provided via telemedicine using a 2-way, interactive audio and video technology.  Names of all persons participating in this telemedicine service and their role in this encounter. Name: Buford Dresser, DO Role: Psychiatrist   Name: Natalie Thomas  Role: Patient    Faythe Dingwall, DO 03/10/2019 12:42 PM

## 2019-03-10 NOTE — Progress Notes (Signed)
Initial Nutrition Assessment  DOCUMENTATION CODES:   Obesity unspecified  INTERVENTION:  - will order Ensure Enlive once/day, each supplement provides 350 kcal and 20 grams of protein. - will order 30 mL Prostat BID, each supplement provides 100 kcal and 15 grams of protein. - will order daily multivitamin with minerals. - continue to encourage PO intakes.    NUTRITION DIAGNOSIS:   Inadequate oral intake related to acute illness as evidenced by per patient/family report.  GOAL:   Patient will meet greater than or equal to 90% of their needs  MONITOR:   PO intake, Supplement acceptance, Labs, Weight trends  REASON FOR ASSESSMENT:   Malnutrition Screening Tool  ASSESSMENT:   58 y.o. female with medical history significant of COPD on 2L oxygen, HTN, type 2 DM, chronic hyponatremia, schizoaffective disorder, and depression. She was sent from nursing home d/t concern for increased agitation and labile mood. She provided a vague history and appeared frustrated and irritated when answering questions. She reported that for awhile she had been more upset and that people in the nursing home were threatening her. She stated her Depakote regimen had been recently adjusted and she had been feeling more paranoid. She also reported constipation PTA with inability to recall last BM. She stated she has new dysuria, new chest pain which is better when laying down, and chronic abdominal pain 2/2 hernia.  Per flow sheet, patient consumed 40% of breakfast this AM (236 kcal, 13 grams protein). Patient reports ongoing abdominal pain/pressure which is not worsened with PO intakes. This has been ongoing for unknown amount of time and does not lead to nausea. Unable to obtain much other nutrition-related information at this time. Patient lives in a nursing home.   Per chart review, current weight is 197 lb, weight on 6/25 was 218 lb, and weight on 06/17/18 was 182 lb. Will monitor weight trends.  Per  notes: - increasing agitation and labile mood--suspected poly-pharmacy for patient on numerous sedating psych meds - hyponatremia--chronic with baseline usually 125-135 mmol/l; on home NaCl tablets - UTI - constipation--bowel regimen ordered    Labs reviewed; Na: 123 mmol/l, Cl: 86 mmol/l. Medications reviewed; 100 mg colace BID, 20 mg oral lasix BID, 5 mg melatonin/day, 40 mg oral protonix/day, 1 packet miralax BID, 1 g oral NaCl TID.     NUTRITION - FOCUSED PHYSICAL EXAM:  completed; no muscle and no fat wasting; moderate edema to BLE.   Diet Order:   Diet Order            Diet Heart Room service appropriate? Yes; Fluid consistency: Thin  Diet effective now              EDUCATION NEEDS:   No education needs have been identified at this time  Skin:  Skin Assessment: Reviewed RN Assessment  Last BM:  9/15  Height:   Ht Readings from Last 1 Encounters:  12/03/18 5\' 3"  (1.6 m)    Weight:   Wt Readings from Last 1 Encounters:  03/10/19 89.4 kg    Ideal Body Weight:  52.3 kg  BMI:  Body mass index is 34.91 kg/m.  Estimated Nutritional Needs:   Kcal:  1775-2050 kcal  Protein:  70-80 grams  Fluid:  >/= 2 L/day      Jarome Matin, MS, RD, LDN, [facility] Inpatient Clinical Dietitian Pager # [phone] After hours/weekend pager # [phone]

## 2019-03-10 NOTE — Progress Notes (Signed)
PROGRESS NOTE    Natalie Thomas  [id number] DOB: March 27, 1961 DOA: 03/09/2019 PCP: System, Pcp Not In  Brief Narrative:  HPI per Dr. Ileene Musa on 03/09/2019  Natalie Thomas is a 58 y.o. female with medical history significant of COPD on 2 L oxygen, hypertension, type 2 diabetes, chronic hyponatremia, schizoaffective disorder, depression who was sent from facility for concerns of increased agitation and labile mood.  Patient had an array of multiple complaints but was vague in giving history.  She appeared frustrated and irritated when answering questions.  She reports that for a while she has had more upset mood and that people in the nursing home were threatening her.  She felt that a nurse there was not giving her medication properly.Reportedly from ER nursing patient had recent changes to her Depakote and has been more paranoid.  She also reports that her "bowels have not been right" and that she has been constipated.  Unable to recall her last bowel movement.  She also complains of new dysuria.  Also noted chest pain that started today but is better because she is laying down.  She is unable to describe the characteristic, onset or duration of her pain to me.  She endorses hurting in her abdomen every day because of a hernia.    ED Course: She was afebrile but hypertensive up to 200 over 70s on 2 L of oxygen.  CBC showed no leukocytosis or anemia.  BMP showed hyponatremia with sodium of 120 down from 132 months ago.  However, it appears that patient has issue with chronic hyponatremia in the past.  Troponin of 7.  EKG showed normal sinus rhythm.  Urinalysis showed large leukocyte and positive nitrite with many bacteria.  Chest x-ray showed no acute cardiopulmonary process.  **Interim History She complains of having mood changes significantly so psychiatry was here.  Also complains of abdominal pain and asking if she has a urinary tract infection.  Will obtain a KUB and defer to psychiatry  to further address her medications  Assessment & Plan:   Principal Problem:   Schizoaffective disorder, bipolar type ([place]) Active Problems:   Urinary tract bacterial infections  Increasing agitation and labile mood with history of schizoaffective disorder/depression/anxiety associated with paranoia -continue Saphris, wellbutrin, Valium, hydroxyzine, seroquel, tradzone, depokate  - suspect poly-pharmacy-pt on numerous sedating psychiatric meds.  -Will consult psych to evaluate in the morning for further recommendation.  -check depakote level -Psych request verification medications if she is taking Saphris 5 mg twice daily she recommends increasing to 10 mg twice daily; we will have pharmacy review verify her medications -Sees Dr. Lucita Ferrara in outpatient setting -Psychiatry feels that she does not warrant inpatient psychiatric hospitalization at this time and recommends continued follow-up with her outpatient provider at her assisted living facility  Hyponatremia - Chronic but usually baseline around 125-130 range. Possibly due to polypharmacy? or decrease PO? - has received 500cc in ED and will repeat BMP. Actually appears a little volume overloaded with +2 pitting on exam. -continue home sodium tablet 1 g p.o. 3 times daily with meals and home Lasix 20 mg p.o. twice daily - Urine osm was 237, Continue to Monitor sodium labs -Sodium is now improved and went from 120-128  Urinary tract infection -Urinalysis on admission showed hazy appearance with straw-colored urine, small hemoglobin, large leukocytes, positive nitrites, many bacteria, 0-5 RBCs per high-power field, 11-20 WBCs per UA and urine culture still pending -Given Cipro in ED. Will start Rocephin -Continue to  monitor cultures and sensitivities and adjust antibiotics as necessary  Hypertension -Patient was quite hypertensive on admission and is unsure if she is still taking her antihypertensives -C/w home Nifedipine 90 mg daily,  metoprolol tartrate 25 mg p.o. twice daily, Lasix 20 mg p.o. twice daily  Constipation and abdominal pain -Check a KUB -Continue with bowel regimen that was initiated with MiraLAX 17 g p.o. twice daily along with docusate 100 mg p.o. twice daily  COPD -Chest x-ray showed no acute cardiopulmonary normality -Stable. Continue home 2L -Continue with home Brio Ellipta  Obesity -Estimated body mass index is 34.91 kg/m as calculated from the following:   Height as of 12/03/18: 5\' 3"  (1.6 m).   Weight as of this encounter: 89.4 kg. -Weight Loss and Dietary Counseling given -Nutritionist consulted for further evaluation and recommendations and recommending Ensure alive once dai, as well as 30 mL's of prostate twice daily and a daily multivitamin and encouraging p.o. intake  DVT prophylaxis: Enoxaparin 40 mg sq q24h Code Status: FULL CODE  Family Communication: No family present at bedside  Disposition Plan: Remain inpatient for continued treatment of urinary tract infection and adjustment of her medications until she is back to baseline and will follow cultures and sensitivities  Consultants:   Psychiatry Dr. Mariea Clonts   Procedures: None   Antimicrobials:  Anti-infectives (From admission, onward)   Start     Dose/Rate Route Frequency Ordered Stop   03/09/19 2200  cefTRIAXone (ROCEPHIN) 1 g in sodium chloride 0.9 % 100 mL IVPB     1 g 200 mL/hr over 30 Minutes Intravenous Every 24 hours 03/09/19 1935     03/09/19 1845  ciprofloxacin (CIPRO) tablet 500 mg     500 mg Oral  Once 03/09/19 1830 03/09/19 1852     Subjective: Seen and examined at bedside and states that she is not doing well with complaints of abdominal pain.  Denies any chest pain, lightheadedness but states her moods been off recently.  No other concerns or plans at this time.  Objective: Vitals:   03/10/19 0808 03/10/19 1011 03/10/19 1340 03/10/19 1441  BP:   (!) 142/73 (!) 147/75  Pulse:  (!) 59 (!) 57 (!) 56   Resp:   16   Temp:    98.8 F (37.1 C)  TempSrc:    Oral  SpO2: 96%  95% 98%  Weight:        Intake/Output Summary (Last 24 hours) at 03/10/2019 1959 Last data filed at 03/10/2019 1845 Gross per 24 hour  Intake 1140.64 ml  Output 3325 ml  Net -2184.36 ml   Filed Weights   03/10/19 0555  Weight: 89.4 kg   Examination: Physical Exam:  Constitutional: WN/WD obese Caucasian female in NAD and appears anxious Eyes: Lids and conjunctivae normal, sclerae anicteric  ENMT: External Ears, Nose appear normal. Grossly normal hearing.  Neck: Appears normal, supple, no cervical masses, normal ROM, no appreciable thyromegaly; no JVD Respiratory: Clear to auscultation bilaterally, no wheezing, rales, rhonchi or crackles. Normal respiratory effort and patient is not tachypenic. No accessory muscle use.  Cardiovascular: RRR, no murmurs / rubs / gallops. S1 and S2 auscultated. 1+ LE extremity edema. 2+ pedal pulses. No carotid bruits.  Abdomen: Soft, Tender to palpation, Distended 2/2 body habitus. No masses palpated. No appreciable hepatosplenomegaly. Bowel sounds positive x4.  GU: Deferred. Musculoskeletal: No clubbing / cyanosis of digits/nails. No joint deformity upper and lower extremities. Skin: No rashes, lesions, ulcers on a limited skin evaluation. No induration;  Warm and dry.  Neurologic: CN 2-12 grossly intact with no focal deficits. Romberg sign and cerebellar reflexes not assessed.  Psychiatric: Normal judgment and insight. Alert and oriented x 3. Anxious mood  Data Reviewed: I have personally reviewed following labs and imaging studies  CBC: Recent Labs  Lab 03/09/19 1654 03/10/19 0254  WBC 9.7 5.4  HGB 14.2 13.2  HCT 43.0 41.3  MCV 89.2 90.2  PLT 230 [id number]   Basic Metabolic Panel: Recent Labs  Lab 03/09/19 1654 03/09/19 2121 03/10/19 0850  NA 120* 123* 128*  K 4.2 4.6 5.1  CL 83* 86* 88*  CO2 28 28 29   GLUCOSE 90 81 113*  BUN 7 6 6   CREATININE 0.55 0.49 0.53   CALCIUM 8.7* 8.8* 9.1  MG  --   --  2.0  PHOS  --   --  5.3*   GFR: Estimated Creatinine Clearance: 82.3 mL/min (by C-G formula based on SCr of 0.53 mg/dL). Liver Function Tests: Recent Labs  Lab 03/09/19 1654 03/10/19 0850  AST 11* 8*  ALT 7 7  ALKPHOS 80 82  BILITOT 0.4 0.3  PROT 7.5 7.2  ALBUMIN 3.5 3.5   Recent Labs  Lab 03/09/19 1654  LIPASE 31   No results for input(s): AMMONIA in the last 168 hours. Coagulation Profile: No results for input(s): INR, PROTIME in the last 168 hours. Cardiac Enzymes: No results for input(s): CKTOTAL, CKMB, CKMBINDEX, TROPONINI in the last 168 hours. BNP (last 3 results) No results for input(s): PROBNP in the last 8760 hours. HbA1C: No results for input(s): HGBA1C in the last 72 hours. CBG: Recent Labs  Lab 03/09/19 1645  GLUCAP 100*   Lipid Profile: No results for input(s): CHOL, HDL, LDLCALC, TRIG, CHOLHDL, LDLDIRECT in the last 72 hours. Thyroid Function Tests: No results for input(s): TSH, T4TOTAL, FREET4, T3FREE, THYROIDAB in the last 72 hours. Anemia Panel: No results for input(s): VITAMINB12, FOLATE, FERRITIN, TIBC, IRON, RETICCTPCT in the last 72 hours. Sepsis Labs: No results for input(s): PROCALCITON, LATICACIDVEN in the last 168 hours.  Recent Results (from the past 240 hour(s))  SARS CORONAVIRUS 2 (TAT 6-24 HRS) Nasopharyngeal Nasopharyngeal Swab     Status: None   Collection Time: 03/09/19  6:47 PM   Specimen: Nasopharyngeal Swab  Result Value Ref Range Status   SARS Coronavirus 2 NEGATIVE NEGATIVE Final    Comment: (NOTE) SARS-CoV-2 target nucleic acids are NOT DETECTED. The SARS-CoV-2 RNA is generally detectable in upper and lower respiratory specimens during the acute phase of infection. Negative results do not preclude SARS-CoV-2 infection, do not rule out co-infections with other pathogens, and should not be used as the sole basis for treatment or other patient management decisions. Negative results  must be combined with clinical observations, patient history, and epidemiological information. The expected result is Negative. Fact Sheet for Patients: SugarRoll.be Fact Sheet for Healthcare Providers: https://www.woods-mathews.com/ This test is not yet approved or cleared by the [place] FDA and  has been authorized for detection and/or diagnosis of SARS-CoV-2 by FDA under an Emergency Use Authorization (EUA). This EUA will remain  in effect (meaning this test can be used) for the duration of the COVID-19 declaration under Section 56 4(b)(1) of the Act, 21 U.S.C. section 360bbb-3(b)(1), unless the authorization is terminated or revoked sooner. Performed at [facility], [place] [address], [place], [place] [address]      Radiology Studies: Dg Chest Portable 1 View  Result Date: 03/09/2019 CLINICAL DATA:  Pt arrives  [facility] from [facility] for psych and medication evaluation. Rx changed in the last 2 weeks, since this- pt has been paranoid and acting differently. H/o CHF, COPD, Diabetes type 2, HTN. Smoker. EXAM: PORTABLE CHEST 1 VIEW COMPARISON:  12/05/2018 and earlier exams. FINDINGS: Cardiac silhouette is mildly enlarged. No mediastinal or hilar masses. No evidence of adenopathy. Mild opacity projects at the left lung base behind the cardiac silhouette, likely atelectasis. Lungs otherwise clear. No convincing pleural effusion.  No pneumothorax. Skeletal structures are grossly intact. IMPRESSION: No acute cardiopulmonary disease. Electronically Signed   By: Lajean Manes M.D.   On: 03/09/2019 17:07   Scheduled Meds: . asenapine  5 mg Sublingual BID  . aspirin  81 mg Oral Daily  . buPROPion  150 mg Oral Daily  . diazepam  5 mg Oral TID  . docusate sodium  100 mg Oral BID  . enoxaparin (LOVENOX) injection  40 mg Subcutaneous Q24H  . feeding supplement (ENSURE ENLIVE)  237 mL Oral Q24H  . feeding supplement (PRO-STAT SUGAR FREE 64)  30  mL Oral BID  . fluticasone furoate-vilanterol  1 puff Inhalation Daily  . furosemide  20 mg Oral BID  . gabapentin  100 mg Oral QHS  . hydrOXYzine  50 mg Oral TID  . mouth rinse  15 mL Mouth Rinse BID  . Melatonin  5 mg Oral QHS  . metoprolol tartrate  25 mg Oral BID  . multivitamin with minerals  1 tablet Oral Daily  . NIFEdipine  90 mg Oral Daily  . pantoprazole  40 mg Oral Daily  . polyethylene glycol  17 g Oral BID  . QUEtiapine  50 mg Oral QHS  . sodium chloride  1 g Oral TID WC  . valproic acid  500 mg Oral BID   Continuous Infusions: . sodium chloride 1,000 mL (03/10/19 0005)  . cefTRIAXone (ROCEPHIN)  IV 1 g (03/10/19 0010)    LOS: 1 day   Kerney Elbe, DO [facility] PAGER is on [place]  If 7PM-7AM, please contact night-coverage [email or web address] Password [facility] 03/10/2019, 7:59 PM

## 2019-03-10 NOTE — Consult Note (Signed)
Spoke to nurse caring for patient by phone and asked to obtain iPad for televisit. He reports that he is not available to obtain it a this time and does not know when he will be available. Asked to have someone who is free to assist and he did not know anyone available. Please contact psychiatry when able to complete televisit for patient.   Buford Dresser, DO 03/10/19 1:06 PM

## 2019-03-10 NOTE — Progress Notes (Signed)
Dr Mariea Clonts never spoke to me regarding this patient. However, after reviewing her note, I have requested that the telepsych iPad be transferred from [place] after she is done with her other consults.   I also passed this information along to the receiving RN, Laural Benes.   Thank you, Concha Norway

## 2019-03-10 NOTE — TOC Initial Note (Signed)
Transition of Care [facility]) - Initial/Assessment Note    Patient Details  Name: Natalie Thomas MRN: [id number] Date of Birth: Oct 11, 1960  Transition of Care (TOC) CM/SW Contact:    Joaquin Courts, RN Phone Number: 03/10/2019, 11:13 AM  Clinical Narrative:   Patient from [facility].                 Expected Discharge Plan: Assisted Living Barriers to Discharge: Continued Medical Work up   Patient Goals and CMS Choice        Expected Discharge Plan and Services Expected Discharge Plan: Assisted Living   Discharge Planning Services: CM Consult   Living arrangements for the past 2 months: [place]                                      Prior Living Arrangements/Services Living arrangements for the past 2 months: [employer] Lives with:: Facility Resident Patient language and need for interpreter reviewed:: Yes Do you feel safe going back to the place where you live?: Yes            Criminal Activity/Legal Involvement Pertinent to Current Situation/Hospitalization: No - Comment as needed  Activities of Daily Living Home Assistive Devices/Equipment: Wheelchair ADL Screening (condition at time of admission) Patient's cognitive ability adequate to safely complete daily activities?: Yes Is the patient deaf or have difficulty hearing?: No Does the patient have difficulty seeing, even when wearing glasses/contacts?: Yes Does the patient have difficulty concentrating, remembering, or making decisions?: Yes Patient able to express need for assistance with ADLs?: Yes Does the patient have difficulty dressing or bathing?: Yes Independently performs ADLs?: No Communication: Independent Dressing (OT): Independent Grooming: Independent Feeding: Independent Bathing: Needs assistance Is this a change from baseline?: Pre-admission baseline Toileting: Needs assistance Is this a change from baseline?: Pre-admission  baseline In/Out Bed: Needs assistance Is this a change from baseline?: Pre-admission baseline Walks in Home: Independent with device (comment)(Wheelchair to get around due to pain in legs) Does the patient have difficulty walking or climbing stairs?: Yes Weakness of Legs: Both Weakness of Arms/Hands: None  Permission Sought/Granted                  Emotional Assessment           Psych Involvement: No (comment)  Admission diagnosis:  Hyponatremia [E87.1] Acute cystitis without hematuria [N30.00] Patient Active Problem List   Diagnosis Date Noted  . Urinary tract bacterial infections 03/09/2019  . Acute cystitis without hematuria   . Constipation   . Acute hypoxemic respiratory failure ([place]) 12/03/2018  . Irritability 11/09/2018  . Aggressive behavior 11/09/2018  . Agitation   . S/P right hip fracture 11/07/2017  . Closed comminuted intertrochanteric fracture of right femur ([place]) 11/07/2017  . Anxiety and depression   . Dyspnea 10/16/2017  . Anemia 10/16/2017  . COPD exacerbation ([place]) 10/07/2017  . Hyperlipidemia 10/07/2017  . Chronic respiratory failure with hypoxia ([place]) 10/07/2017  . Acute on chronic respiratory failure with hypoxia and hypercapnia ([place]) 10/07/2017  . Incontinence of urine 08/14/2011  . Hyponatremia 07/27/2011  . Schizoaffective disorder, bipolar type ([place]) 07/05/2011  . Cough 05/08/2011  . Encounter for long-term (current) use of other medications 04/09/2011  . Lumbar disc disease 04/09/2011  . Polycythemia secondary to smoking 04/09/2011  . HIP PAIN, LEFT 12/12/2007  . 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 DIABETES MELLITUS, TYPE  II 04/08/2007  . MENOPAUSE, PREMATURE 04/08/2007  . Chronic hyponatremia 04/08/2007  . Schizoaffective disorder ([place]) 04/08/2007  . DEPRESSION 04/08/2007  . Essential hypertension 04/08/2007  . COPD 04/08/2007  . GERD 04/08/2007  . Osteoporosis 04/08/2007  . DYSURIA 04/08/2007   PCP:  System, Pcp Not  In Pharmacy:   [facility], [place] [address] Phone: [phone] Fax: [place], [place] [address] Phone: [phone] Fax: [phone]     Social Determinants of Health (SDOH) Interventions    Readmission Risk Interventions Readmission Risk Prevention Plan 03/10/2019 12/08/2018  Transportation Screening Complete Complete  Medication Review ([place]) Complete Complete  PCP or Specialist appointment within 3-5 days of discharge Complete Complete  HRI or Home Care Consult Complete Complete  SW Recovery Care/Counseling Consult Complete Complete  Palliative Care Screening Not Applicable Not Applicable  Skilled Nursing Facility Complete Complete  Some recent data might be hidden

## 2019-03-10 NOTE — Plan of Care (Signed)
  Problem: Education: °Goal: Knowledge of General Education information will improve °Description: Including pain rating scale, medication(s)/side effects and non-pharmacologic comfort measures °Outcome: Progressing °  °Problem: Clinical Measurements: °Goal: Cardiovascular complication will be avoided °Outcome: Progressing °  °Problem: Coping: °Goal: Level of anxiety will decrease °Outcome: Progressing °  °Problem: Elimination: °Goal: Will not experience complications related to urinary retention °Outcome: Progressing °  °

## 2019-03-11 DIAGNOSIS — E119 Type 2 diabetes mellitus without complications: Secondary | ICD-10-CM

## 2019-03-11 DIAGNOSIS — R1010 Upper abdominal pain, unspecified: Secondary | ICD-10-CM

## 2019-03-11 LAB — BASIC METABOLIC PANEL
Anion gap: 9 (ref 5–15)
BUN: 14 mg/dL (ref 6–20)
CO2: 32 mmol/L (ref 22–32)
Calcium: 9.1 mg/dL (ref 8.9–10.3)
Chloride: 88 mmol/L — ABNORMAL LOW (ref 98–111)
Creatinine, Ser: 0.68 mg/dL (ref 0.44–1.00)
GFR calc Af Amer: >60 mL/min (ref >60)
GFR calc non Af Amer: >60 mL/min (ref >60)
Glucose, Bld: 148 mg/dL — ABNORMAL HIGH (ref 70–99)
Potassium: 4.7 mmol/L (ref 3.5–5.1)
Sodium: 129 mmol/L — ABNORMAL LOW (ref 135–145)

## 2019-03-11 LAB — CBC WITH DIFFERENTIAL/PLATELET
Abs Immature Granulocytes: 0.02 10*3/uL (ref 0.00–0.07)
Basophils Absolute: 0 10*3/uL (ref 0.0–0.1)
Basophils Relative: 1 %
Eosinophils Absolute: 0.1 10*3/uL (ref 0.0–0.5)
Eosinophils Relative: 2 %
HCT: 45.8 % (ref 36.0–46.0)
Hemoglobin: 14.2 g/dL (ref 12.0–15.0)
Immature Granulocytes: 0 %
Lymphocytes Relative: 26 %
Lymphs Abs: 1.4 10*3/uL (ref 0.7–4.0)
MCH: 28.6 pg (ref 26.0–34.0)
MCHC: 31 g/dL (ref 30.0–36.0)
MCV: 92.3 fL (ref 80.0–100.0)
Monocytes Absolute: 0.6 10*3/uL (ref 0.1–1.0)
Monocytes Relative: 11 %
Neutro Abs: 3.2 10*3/uL (ref 1.7–7.7)
Neutrophils Relative %: 60 %
Platelets: 211 10*3/uL (ref 150–400)
RBC: 4.96 MIL/uL (ref 3.87–5.11)
RDW: 13.8 % (ref 11.5–15.5)
WBC: 5.4 10*3/uL (ref 4.0–10.5)
nRBC: 0 % (ref 0.0–0.2)

## 2019-03-11 LAB — COMPREHENSIVE METABOLIC PANEL
ALT: 6 U/L (ref 0–44)
AST: 8 U/L — ABNORMAL LOW (ref 15–41)
Albumin: 3 g/dL — ABNORMAL LOW (ref 3.5–5.0)
Alkaline Phosphatase: 70 U/L (ref 38–126)
Anion gap: 10 (ref 5–15)
BUN: 15 mg/dL (ref 6–20)
CO2: 26 mmol/L (ref 22–32)
Calcium: 9 mg/dL (ref 8.9–10.3)
Chloride: 90 mmol/L — ABNORMAL LOW (ref 98–111)
Creatinine, Ser: 0.51 mg/dL (ref 0.44–1.00)
GFR calc Af Amer: >60 mL/min (ref >60)
GFR calc non Af Amer: >60 mL/min (ref >60)
Glucose, Bld: 88 mg/dL (ref 70–99)
Potassium: 4.8 mmol/L (ref 3.5–5.1)
Sodium: 126 mmol/L — ABNORMAL LOW (ref 135–145)
Total Bilirubin: 0.4 mg/dL (ref 0.3–1.2)
Total Protein: 6.6 g/dL (ref 6.5–8.1)

## 2019-03-11 LAB — MRSA PCR SCREENING: MRSA by PCR: POSITIVE — AB

## 2019-03-11 LAB — PHOSPHORUS: Phosphorus: 5.9 mg/dL — ABNORMAL HIGH (ref 2.5–4.6)

## 2019-03-11 LAB — MAGNESIUM: Magnesium: 1.9 mg/dL (ref 1.7–2.4)

## 2019-03-11 MED ORDER — CHLORHEXIDINE GLUCONATE CLOTH 2 % EX PADS
6.0000 | MEDICATED_PAD | Freq: Every day | CUTANEOUS | Status: AC
  Administered 2019-03-11 – 2019-03-13 (×3): 6 via TOPICAL

## 2019-03-11 MED ORDER — MUPIROCIN 2 % EX OINT
1.0000 "application " | TOPICAL_OINTMENT | Freq: Two times a day (BID) | CUTANEOUS | Status: AC
  Administered 2019-03-11 – 2019-03-14 (×7): 1 via NASAL
  Filled 2019-03-11: qty 22

## 2019-03-11 NOTE — Progress Notes (Signed)
[facility]  PROGRESS NOTE  Natalie Thomas [id number] DOB: 06-17-1961 DOA: 03/09/2019 PCP: System, Pcp Not In  Brief History    Natalie Thomas is a 58 y.o. year old female with medical history significant for COPD on 2 Loxygen, hypertension, type 2 diabetes,chronic hyponatremia,schizoaffective disorder, depression who presented on 03/09/2019 with increased agitation and labile mood per SNF.  And was found to have acute on chronic hyponatremia of 120, UTI, and hypertensive urgency with BP 200/76 on admission.  ED course: Sodium of 120.  Previous baseline of 130.,  Serum osmolality 257, urine oxmolality106, urine sodium 39, SARSUrine osmolality testing negative, high-sensitivity troponin 7. Ua with moderate leukocytes, positive nitrites, large leukocytes, many bacteria  A & P     Hypovolemic hyponatremia, acute on chronic, slightly improving.  Nadir of 120, previous baseline of 130, improved from 126 this a.m. to 129 this afternoon on 9/16.  Patient is on several psychiatric medications that could be contributing as well but seems to be improving. Continue to monitor   UTI secondary to E.coli.  Continue ceftriaxone while awaiting specification of urine cultures, remains afebrile, symptoms controlled, normal white count.   Mood stabilization/paranoia related to schizoaffective disorder and depression.  Psychiatry evaluated on 9/15.  Recommended increasing Saphris 10 mg twice daily for mood stabilization, with close monitoring of QTC.  Continue previous home psych regimen.   Nonspecific abdominal pain.  Suspect related to constipation.  Abdominal x-ray shows no acute abnormalities.  Patient still having normal BMs will continue bowel regimen and monitor closely   HTN blood pressure much improved SBP's in the 150s.  Continue home BP regimen   COPD on 2 L.  No active wheezing, continue scheduled inhaler therapy   Type 2 diabetes, diet controlled.  A1c of 6 back in June.   CBGs have been stable here.     DVT prophylaxis: Lovenox Code Status: Full code Family Communication: No family at bedside Disposition Plan: Close monitoring of sodium, still awaiting urine cultures to further narrow antibiotics for UTI, anticipate discharge next 24 hours if continues to improve/remained stable    [facility] Direct contact: see [email or web address] (further directions at bottom of note if needed) 7PM-7AM contact night coverage as at bottom of note 03/11/2019, 8:49 AM  LOS: 2 days   Consultants  . Psychiatry  Procedures  . None  Antibiotics  . Ceftriaxone  Interval History/Subjective  Feels that she is having some belly pain and nausea  Objective   Vitals:  Vitals:   03/10/19 1441 03/11/19 0600  BP: (!) 147/75 (!) 152/66  Pulse: (!) 56 (!) 52  Resp:  17  Temp: 98.8 F (37.1 C) 97.6 F (36.4 C)  SpO2: 98% 95%    Exam:  Awake Alert, Oriented X place, context, self, No new F.N deficits, flat affect [place].AT, Symmetrical Chest wall movement, Good air movement bilaterally, CTAB RRR,No Gallops,Rubs or new Murmurs,  +ve B.Sounds, Abd Soft, No tenderness,  No rebound - guarding or rigidity. No Cyanosis, Clubbing or edema, No new Rash or bruise    I have personally reviewed the following:   Data Reviewed: Basic Metabolic Panel: Recent Labs  Lab 03/09/19 1654 03/09/19 2121 03/10/19 0850 03/11/19 0550  NA 120* 123* 128* 126*  K 4.2 4.6 5.1 4.8  CL 83* 86* 88* 90*  CO2 28 28 29 26   GLUCOSE 90 81 113* 88  BUN 7 6 6 15   CREATININE 0.55 0.49 0.53 0.51  CALCIUM 8.7* 8.8* 9.1 9.0  MG  --   --  2.0 1.9  PHOS  --   --  5.3* 5.9*   Liver Function Tests: Recent Labs  Lab 03/09/19 1654 03/10/19 0850 03/11/19 0550  AST 11* 8* 8*  ALT 7 7 6   ALKPHOS 80 82 70  BILITOT 0.4 0.3 0.4  PROT 7.5 7.2 6.6  ALBUMIN 3.5 3.5 3.0*   Recent Labs  Lab 03/09/19 1654  LIPASE 31   No results for input(s): AMMONIA in the last 168 hours. CBC: Recent Labs   Lab 03/09/19 1654 03/10/19 0254 03/11/19 0550  WBC 9.7 5.4 5.4  NEUTROABS  --   --  3.2  HGB 14.2 13.2 14.2  HCT 43.0 41.3 45.8  MCV 89.2 90.2 92.3  PLT 230 191 211   Cardiac Enzymes: No results for input(s): CKTOTAL, CKMB, CKMBINDEX, TROPONINI in the last 168 hours. BNP (last 3 results) Recent Labs    06/17/18 2133 12/03/18 0957 12/06/18 0911  BNP 125.4* 296.6* 132.3*    ProBNP (last 3 results) No results for input(s): PROBNP in the last 8760 hours.  CBG: Recent Labs  Lab 03/09/19 1645  GLUCAP 100*    Recent Results (from the past 240 hour(s))  Urine Culture     Status: Abnormal (Preliminary result)   Collection Time: 03/09/19  6:31 PM   Specimen: Urine, Random  Result Value Ref Range Status   Specimen Description   Final    URINE, RANDOM Performed at [place] [address] [place], [place] [address]    Special Requests   Final    NONE Performed at [facility], [place] [address] [place], [place] [address]    Culture >=100,000 COLONIES/mL GRAM NEGATIVE RODS (A)  Final   Report Status PENDING  Incomplete  SARS CORONAVIRUS 2 (TAT 6-24 HRS) Nasopharyngeal Nasopharyngeal Swab     Status: None   Collection Time: 03/09/19  6:47 PM   Specimen: Nasopharyngeal Swab  Result Value Ref Range Status   SARS Coronavirus 2 NEGATIVE NEGATIVE Final    Comment: (NOTE) SARS-CoV-2 target nucleic acids are NOT DETECTED. The SARS-CoV-2 RNA is generally detectable in upper and lower respiratory specimens during the acute phase of infection. Negative results do not preclude SARS-CoV-2 infection, do not rule out co-infections with other pathogens, and should not be used as the sole basis for treatment or other patient management decisions. Negative results must be combined with clinical observations, patient history, and epidemiological information. The expected result is Negative. Fact Sheet for Patients:  SugarRoll.be Fact Sheet for Healthcare Providers: https://www.woods-mathews.com/ This test is not yet approved or cleared by the [place] FDA and  has been authorized for detection and/or diagnosis of SARS-CoV-2 by FDA under an Emergency Use Authorization (EUA). This EUA will remain  in effect (meaning this test can be used) for the duration of the COVID-19 declaration under Section 56 4(b)(1) of the Act, 21 U.S.C. section 360bbb-3(b)(1), unless the authorization is terminated or revoked sooner. Performed at [facility], [place] [address], [place],  [address]   MRSA PCR Screening     Status: Abnormal   Collection Time: 03/11/19 12:20 AM   Specimen: Nasopharyngeal  Result Value Ref Range Status   MRSA by PCR POSITIVE (A) NEGATIVE Final    Comment:        The GeneXpert MRSA Assay (FDA approved for NASAL specimens only), is one component of a comprehensive MRSA colonization surveillance program. It is not intended to diagnose MRSA infection nor to guide or monitor treatment  for MRSA infections. RESULT CALLED TO, READ BACK BY AND VERIFIED WITH: VAUGHN,M @ [id number] ON [id number] BY POTEAT,S Performed at [facility], [place] [address], [place], [place] [address]      Studies: Dg Abd 1 View  Result Date: 03/10/2019 CLINICAL DATA:  Abdominal pain EXAM: ABDOMEN - 1 VIEW COMPARISON:  11/04/2017 FINDINGS: Changes of prior sacroplasty are noted bilaterally. Prior fixation of the proximal right femur is seen new from the prior exam. Pelvic ring appears intact. Old pubic rami fractures are seen. Scattered large and small bowel gas is noted without obstructive pattern. No free air is seen. IMPRESSION: No acute abnormality noted.  Chronic changes are identified. Electronically Signed   By: Inez Catalina M.D.   On: 03/10/2019 20:35   Dg Chest Portable 1 View  Result Date: 03/09/2019 CLINICAL DATA:  [facility] for psych and medication evaluation. Rx changed in the last 2 weeks, since this- pt has been paranoid and acting differently. H/o CHF, COPD, Diabetes type 2, HTN. Smoker. EXAM: PORTABLE CHEST 1 VIEW COMPARISON:  12/05/2018 and earlier exams. FINDINGS: Cardiac silhouette is mildly enlarged. No mediastinal or hilar masses. No evidence of adenopathy. Mild opacity projects at the left lung base behind the cardiac silhouette, likely atelectasis. Lungs otherwise clear. No convincing pleural effusion.  No pneumothorax. Skeletal structures are grossly intact. IMPRESSION: No acute cardiopulmonary disease. Electronically Signed   By: Lajean Manes M.D.   On: 03/09/2019 17:07    Scheduled Meds: . asenapine  10 mg Sublingual BID  . aspirin  81 mg Oral Daily  . buPROPion  150 mg Oral Daily  . Chlorhexidine Gluconate Cloth  6 each Topical Q0600  . diazepam  5 mg Oral TID  . docusate sodium  100 mg Oral BID  . enoxaparin (LOVENOX) injection  40 mg Subcutaneous Q24H  . feeding supplement (ENSURE ENLIVE)  237 mL Oral Q24H  . feeding supplement (PRO-STAT SUGAR FREE 64)  30 mL Oral BID  . fluticasone furoate-vilanterol  1 puff Inhalation Daily  . furosemide  20 mg Oral BID  . gabapentin  100 mg Oral QHS  . hydrOXYzine  50 mg Oral TID  . mouth rinse  15 mL Mouth Rinse BID  . Melatonin  5 mg Oral QHS  . metoprolol tartrate  25 mg Oral BID  . multivitamin with minerals  1 tablet Oral Daily  . mupirocin ointment  1 application Nasal BID  . NIFEdipine  90 mg Oral Daily  . pantoprazole  40 mg Oral Daily  . polyethylene glycol  17 g Oral BID  . QUEtiapine  50 mg Oral QHS  . sodium chloride  1 g Oral TID WC  . valproic acid  500 mg Oral BID   Continuous Infusions: . sodium chloride 10 mL/hr (03/10/19 2300)  . cefTRIAXone (ROCEPHIN)  IV 1 g (03/10/19 2300)    Principal Problem:   Schizoaffective disorder, bipolar type ([place]) Active Problems:   Urinary tract bacterial infections      Desiree Hane  [facility]

## 2019-03-12 LAB — BASIC METABOLIC PANEL
Anion gap: 11 (ref 5–15)
Anion gap: 8 (ref 5–15)
BUN: 17 mg/dL (ref 6–20)
BUN: 18 mg/dL (ref 6–20)
CO2: 29 mmol/L (ref 22–32)
CO2: 32 mmol/L (ref 22–32)
Calcium: 8.9 mg/dL (ref 8.9–10.3)
Calcium: 9.3 mg/dL (ref 8.9–10.3)
Chloride: 86 mmol/L — ABNORMAL LOW (ref 98–111)
Chloride: 87 mmol/L — ABNORMAL LOW (ref 98–111)
Creatinine, Ser: 0.57 mg/dL (ref 0.44–1.00)
Creatinine, Ser: 0.74 mg/dL (ref 0.44–1.00)
GFR calc Af Amer: >60 mL/min (ref >60)
GFR calc Af Amer: >60 mL/min (ref >60)
GFR calc non Af Amer: >60 mL/min (ref >60)
GFR calc non Af Amer: >60 mL/min (ref >60)
Glucose, Bld: 83 mg/dL (ref 70–99)
Glucose, Bld: 124 mg/dL — ABNORMAL HIGH (ref 70–99)
Potassium: 4.8 mmol/L (ref 3.5–5.1)
Potassium: 5.3 mmol/L — ABNORMAL HIGH (ref 3.5–5.1)
Sodium: 126 mmol/L — ABNORMAL LOW (ref 135–145)
Sodium: 127 mmol/L — ABNORMAL LOW (ref 135–145)

## 2019-03-12 LAB — URINE CULTURE: Culture: >=100000 — AB

## 2019-03-12 MED ORDER — NITROFURANTOIN MONOHYD MACRO 100 MG PO CAPS
100.0000 mg | ORAL_CAPSULE | Freq: Two times a day (BID) | ORAL | Status: AC
  Administered 2019-03-12 – 2019-03-16 (×8): 100 mg via ORAL
  Filled 2019-03-12 (×8): qty 1

## 2019-03-12 NOTE — Progress Notes (Signed)
[facility]  PROGRESS NOTE  VASUDHA MARINACCIO [id number] DOB: 1961-03-17 DOA: 03/09/2019 PCP: System, Pcp Not In  Brief History    LEXIANN ZOPFI is a 58 y.o. year old female with medical history significant for COPD on 2 Loxygen, hypertension, type 2 diabetes,chronic hyponatremia,schizoaffective disorder, depression who presented on 03/09/2019 with increased agitation and labile mood per SNF.  And was found to have acute on chronic hyponatremia of 120, UTI, and hypertensive urgency with BP 200/76 on admission.  ED course: Sodium of 120.  Previous baseline of 130.,  Serum osmolality 257, urine oxmolality106, urine sodium 39, SARSUrine osmolality testing negative, high-sensitivity troponin 7. Ua with moderate leukocytes, positive nitrites, large leukocytes, many bacteria  A & P     Hypovolemic hyponatremia, acute on chronic, stable.  Nadir of 120, previous baseline range of 125-130, Currently 127  Patient is on several psychiatric medications that could be contributing as well but seems to be improving. Continue to monitor, will restrict some of her fluids as she has had polydipsia associated with her levels before   UTI secondary to E.coli.  Based on sensitivities will transition from ceftriaxone to nitrofurantoin remains afebrile, symptoms controlled, normal white count.   Mood stabilization/paranoia related to schizoaffective disorder and depression.  Psychiatry evaluated on 9/15.  Recommended increasing Saphris 10 mg twice daily for mood stabilization, with close monitoring of QTC.  Continue previous home psych regimen.   Nonspecific abdominal pain.  Suspect related to constipation.  Abdominal x-ray shows no acute abnormalities.  Patient still having normal BMs will continue bowel regimen and monitor closely, nontoxic/nonacute abdominal exam   HTN, improved currently at goal.  SBP's in the 130s..  Continue home Lopressor,   COPD on 2 L.  No active wheezing, continue  scheduled inhaler therapy   Type 2 diabetes, diet controlled.  A1c of 6 back in June.  CBGs have been stable here.     DVT prophylaxis: Lovenox Code Status: Full code Family Communication: No family at bedside Disposition Plan: Close monitoring of sodium, if remains clinically stable on oral antibiotic anticipate discharge in 24 hours    [facility] Direct contact: see [email or web address] (further directions at bottom of note if needed) 7PM-7AM contact night coverage as at bottom of note 03/12/2019, 4:00 PM  LOS: 3 days   Consultants  . Psychiatry  Procedures  . None  Antibiotics  . Ceftriaxone  Interval History/Subjective  Reports of belly pain and decreased appetite  Objective   Vitals:  Vitals:   03/12/19 0825 03/12/19 1409  BP:  133/75  Pulse:  (!) 56  Resp:  16  Temp:  97.8 F (36.6 C)  SpO2: 96% 96%    Exam:  Awake Alert, Oriented X place, context, self, No new F.N deficits, flat affect .AT, Symmetrical Chest wall movement, Good air movement bilaterally, CTAB RRR,No Gallops,Rubs or new Murmurs,  +ve B.Sounds, Abd Soft, No tenderness with deep palpation,  No rebound - guarding or rigidity.     I have personally reviewed the following:   Data Reviewed: Basic Metabolic Panel: Recent Labs  Lab 03/10/19 0850 03/11/19 0550 03/11/19 1327 03/12/19 0514 03/12/19 1309  NA 128* 126* 129* 126* 127*  K 5.1 4.8 4.7 4.8 5.3*  CL 88* 90* 88* 86* 87*  CO2 29 26 32 29 32  GLUCOSE 113* 88 148* 83 124*  BUN 6 15 14 17 18   CREATININE 0.53 0.51 0.68 0.57 0.74  CALCIUM 9.1 9.0 9.1 8.9 9.3  MG 2.0 1.9  --   --   --  PHOS 5.3* 5.9*  --   --   --    Liver Function Tests: Recent Labs  Lab 03/09/19 1654 03/10/19 0850 03/11/19 0550  AST 11* 8* 8*  ALT 7 7 6   ALKPHOS 80 82 70  BILITOT 0.4 0.3 0.4  PROT 7.5 7.2 6.6  ALBUMIN 3.5 3.5 3.0*   Recent Labs  Lab 03/09/19 1654  LIPASE 31   No results for input(s): AMMONIA in the last 168 hours. CBC:  Recent Labs  Lab 03/09/19 1654 03/10/19 0254 03/11/19 0550  WBC 9.7 5.4 5.4  NEUTROABS  --   --  3.2  HGB 14.2 13.2 14.2  HCT 43.0 41.3 45.8  MCV 89.2 90.2 92.3  PLT 230 191 211   Cardiac Enzymes: No results for input(s): CKTOTAL, CKMB, CKMBINDEX, TROPONINI in the last 168 hours. BNP (last 3 results) Recent Labs    06/17/18 2133 12/03/18 0957 12/06/18 0911  BNP 125.4* 296.6* 132.3*    ProBNP (last 3 results) No results for input(s): PROBNP in the last 8760 hours.  CBG: Recent Labs  Lab 03/09/19 1645  GLUCAP 100*    Recent Results (from the past 240 hour(s))  Urine Culture     Status: Abnormal   Collection Time: 03/09/19  6:31 PM   Specimen: Urine, Random  Result Value Ref Range Status   Specimen Description   Final    URINE, RANDOM Performed at [place] [address] [place], [place] [address]    Special Requests   Final    NONE Performed at [facility], [place] [address], [place], [place] [address]    Culture >=100,000 COLONIES/mL ESCHERICHIA COLI (A)  Final   Report Status 03/12/2019 FINAL  Final   Organism ID, Bacteria ESCHERICHIA COLI (A)  Final      Susceptibility   Escherichia coli - MIC*    AMPICILLIN >=32 RESISTANT Resistant     CEFAZOLIN <=4 SENSITIVE Sensitive     CEFTRIAXONE <=1 SENSITIVE Sensitive     CIPROFLOXACIN >=4 RESISTANT Resistant     GENTAMICIN >=16 RESISTANT Resistant     IMIPENEM <=0.25 SENSITIVE Sensitive     NITROFURANTOIN <=16 SENSITIVE Sensitive     TRIMETH/SULFA <=20 SENSITIVE Sensitive     AMPICILLIN/SULBACTAM 16 INTERMEDIATE Intermediate     PIP/TAZO <=4 SENSITIVE Sensitive     Extended ESBL NEGATIVE Sensitive     * >=100,000 COLONIES/mL ESCHERICHIA COLI  SARS CORONAVIRUS 2 (TAT 6-24 HRS) Nasopharyngeal Nasopharyngeal Swab     Status: None   Collection Time: 03/09/19  6:47 PM   Specimen: Nasopharyngeal Swab  Result Value Ref Range Status   SARS Coronavirus 2 NEGATIVE NEGATIVE  Final    Comment: (NOTE) SARS-CoV-2 target nucleic acids are NOT DETECTED. The SARS-CoV-2 RNA is generally detectable in upper and lower respiratory specimens during the acute phase of infection. Negative results do not preclude SARS-CoV-2 infection, do not rule out co-infections with other pathogens, and should not be used as the sole basis for treatment or other patient management decisions. Negative results must be combined with clinical observations, patient history, and epidemiological information. The expected result is Negative. Fact Sheet for Patients: SugarRoll.be Fact Sheet for Healthcare Providers: https://www.woods-mathews.com/ This test is not yet approved or cleared by the [place] FDA and  has been authorized for detection and/or diagnosis of SARS-CoV-2 by FDA under an Emergency Use Authorization (EUA). This EUA will remain  in effect (meaning this test can be used) for the duration of the  COVID-19 declaration under Section 56 4(b)(1) of the Act, 21 U.S.C. section 360bbb-3(b)(1), unless the authorization is terminated or revoked sooner. Performed at [facility], [place] [address], [place], [place] [address]   MRSA PCR Screening     Status: Abnormal   Collection Time: 03/11/19 12:20 AM   Specimen: Nasopharyngeal  Result Value Ref Range Status   MRSA by PCR POSITIVE (A) NEGATIVE Final    Comment:        The GeneXpert MRSA Assay (FDA approved for NASAL specimens only), is one component of a comprehensive MRSA colonization surveillance program. It is not intended to diagnose MRSA infection nor to guide or monitor treatment for MRSA infections. RESULT CALLED TO, READ BACK BY AND VERIFIED WITH: VAUGHN,M @ [id number] ON [id number] BY POTEAT,S Performed at [facility], [place] [address] [place], [place] [address]      Studies: Dg Abd 1 View  Result Date: 03/10/2019 CLINICAL DATA:  Abdominal pain EXAM:  ABDOMEN - 1 VIEW COMPARISON:  11/04/2017 FINDINGS: Changes of prior sacroplasty are noted bilaterally. Prior fixation of the proximal right femur is seen new from the prior exam. Pelvic ring appears intact. Old pubic rami fractures are seen. Scattered large and small bowel gas is noted without obstructive pattern. No free air is seen. IMPRESSION: No acute abnormality noted.  Chronic changes are identified. Electronically Signed   By: Inez Catalina M.D.   On: 03/10/2019 20:35    Scheduled Meds: . asenapine  10 mg Sublingual BID  . aspirin  81 mg Oral Daily  . buPROPion  150 mg Oral Daily  . Chlorhexidine Gluconate Cloth  6 each Topical Q0600  . diazepam  5 mg Oral TID  . docusate sodium  100 mg Oral BID  . enoxaparin (LOVENOX) injection  40 mg Subcutaneous Q24H  . feeding supplement (ENSURE ENLIVE)  237 mL Oral Q24H  . feeding supplement (PRO-STAT SUGAR FREE 64)  30 mL Oral BID  . fluticasone furoate-vilanterol  1 puff Inhalation Daily  . furosemide  20 mg Oral BID  . gabapentin  100 mg Oral QHS  . hydrOXYzine  50 mg Oral TID  . mouth rinse  15 mL Mouth Rinse BID  . Melatonin  5 mg Oral QHS  . metoprolol tartrate  25 mg Oral BID  . multivitamin with minerals  1 tablet Oral Daily  . mupirocin ointment  1 application Nasal BID  . NIFEdipine  90 mg Oral Daily  . nitrofurantoin (macrocrystal-monohydrate)  100 mg Oral Q12H  . pantoprazole  40 mg Oral Daily  . polyethylene glycol  17 g Oral BID  . QUEtiapine  50 mg Oral QHS  . sodium chloride  1 g Oral TID WC  . valproic acid  500 mg Oral BID   Continuous Infusions: . sodium chloride Stopped (03/11/19 2318)    Principal Problem:   Schizoaffective disorder, bipolar type ([place]) Active Problems:   Schizoaffective disorder (H[facility])   Essential hypertension   Acute hyponatremia   Anxiety and depression   Agitation   Urinary tract bacterial infections   Type 2 diabetes mellitus without complication ([place])      Desiree Hane  [facility]

## 2019-03-12 NOTE — Care Management Important Message (Signed)
Important Message  Patient Details IM Letter given to Marney Doctor RN to present to the Patient Name: Natalie Thomas MRN: [id number] Date of Birth: [birth year]   Medicare Important Message Given:  Yes     Kerin Salen 03/12/2019, 9:38 AM

## 2019-03-12 NOTE — Progress Notes (Signed)
Spoke with Pamella Pert from [facility] in regards to patient and provide updates.

## 2019-03-13 ENCOUNTER — Inpatient Hospital Stay (HOSPITAL_COMMUNITY): Payer: Medicare Other

## 2019-03-13 DIAGNOSIS — I5032 Chronic diastolic (congestive) heart failure: Secondary | ICD-10-CM

## 2019-03-13 DIAGNOSIS — J9611 Chronic respiratory failure with hypoxia: Secondary | ICD-10-CM

## 2019-03-13 DIAGNOSIS — F419 Anxiety disorder, unspecified: Secondary | ICD-10-CM

## 2019-03-13 DIAGNOSIS — F329 Major depressive disorder, single episode, unspecified: Secondary | ICD-10-CM

## 2019-03-13 DIAGNOSIS — J9811 Atelectasis: Secondary | ICD-10-CM | POA: Clinically undetermined

## 2019-03-13 DIAGNOSIS — I5043 Acute on chronic combined systolic (congestive) and diastolic (congestive) heart failure: Secondary | ICD-10-CM | POA: Diagnosis present

## 2019-03-13 DIAGNOSIS — J90 Pleural effusion, not elsewhere classified: Secondary | ICD-10-CM

## 2019-03-13 LAB — BASIC METABOLIC PANEL
Anion gap: 11 (ref 5–15)
BUN: 20 mg/dL (ref 6–20)
CO2: 30 mmol/L (ref 22–32)
Calcium: 9.1 mg/dL (ref 8.9–10.3)
Chloride: 87 mmol/L — ABNORMAL LOW (ref 98–111)
Creatinine, Ser: 0.64 mg/dL (ref 0.44–1.00)
GFR calc Af Amer: >60 mL/min (ref >60)
GFR calc non Af Amer: >60 mL/min (ref >60)
Glucose, Bld: 95 mg/dL (ref 70–99)
Potassium: 4.8 mmol/L (ref 3.5–5.1)
Sodium: 128 mmol/L — ABNORMAL LOW (ref 135–145)

## 2019-03-13 LAB — HEPATIC FUNCTION PANEL
ALT: 8 U/L (ref 0–44)
AST: 12 U/L — ABNORMAL LOW (ref 15–41)
Albumin: 3.3 g/dL — ABNORMAL LOW (ref 3.5–5.0)
Alkaline Phosphatase: 72 U/L (ref 38–126)
Bilirubin, Direct: 0.1 mg/dL (ref 0.0–0.2)
Indirect Bilirubin: 0.4 mg/dL (ref 0.3–0.9)
Total Bilirubin: 0.5 mg/dL (ref 0.3–1.2)
Total Protein: 6.7 g/dL (ref 6.5–8.1)

## 2019-03-13 MED ORDER — IOHEXOL 300 MG/ML  SOLN
100.0000 mL | Freq: Once | INTRAMUSCULAR | Status: AC | PRN
  Administered 2019-03-13: 100 mL via INTRAVENOUS

## 2019-03-13 MED ORDER — SODIUM CHLORIDE (PF) 0.9 % IJ SOLN
INTRAMUSCULAR | Status: AC
  Filled 2019-03-13: qty 50

## 2019-03-13 NOTE — Progress Notes (Signed)
[facility]  PROGRESS NOTE  Natalie Thomas [id number] DOB: 19-Jul-1960 DOA: 03/09/2019 PCP: System, Pcp Not In  Brief History    Natalie Thomas is a 58 y.o. year old female with medical history significant for COPD on 2 Loxygen, hypertension, type 2 diabetes,chronic hyponatremia,schizoaffective disorder, depression who presented on 03/09/2019 with increased agitation and labile mood per SNF.  And was found to have acute on chronic hyponatremia of 120, UTI, and hypertensive urgency with BP 200/76 on admission.  ED course: Sodium of 120.  Previous baseline of 130.,  Serum osmolality 257, urine oxmolality106, urine sodium 39, SARSUrine osmolality testing negative, high-sensitivity troponin 7. Ua with moderate leukocytes, positive nitrites, large leukocytes, many bacteria  A & P     Hypovolemic hyponatremia, acute on chronic, stable.  Nadir of 120, previous baseline range of 125-130, Currently 128Patient is on several psychiatric medications that could be contributing as well but seems to be improving. Continue to monitor, will restrict some of her fluids as she has had polydipsia associated with her levels before   UTI secondary to E.coli.  Based on sensitivities on nitrofurantoin remains afebrile, symptoms controlled, normal white count.   Mood stabilization/paranoia related to schizoaffective disorder and depression.  Psychiatry evaluated on 9/15.  Recommended increasing Saphris 10 mg twice daily for mood stabilization, with close monitoring of QTC.  Continue previous home psych regimen.   Nonspecific abdominal pain.  Suspect related to constipation.  Abdominal x-ray shows no acute abnormalities, CT abdomen also shows no acute abnormalities shows chronic hernia, patient still having normal BMs will continue bowel regimen and monitor closely, nontoxic/nonacute abdominal exam   Incidental finding of moderate pleural effusion and dependent atelectasis.  Found on CT abdomen  imaging.  Will assess better with chest x-ray to ensure no need for thoracentesis.  Patient seems stable respiratory wise on home O2 however low threshold given patient COPD, psych issues, will hold off on discharge until this is addressed.   CHF with preserved EF.  No obvious signs of acute exacerbation, no increased volume status, stable O2 requirements does have effusion but unclear association.  Closely monitor, daily weights, monitor output.  Continue oral Lasix 20 mg twice daily   HTN, improved currently at goal.  SBP's in the 130s..  Continue home Lopressor,   COPD on 2 L.  No active wheezing, continue scheduled inhaler therapy   Type 2 diabetes, diet controlled.  A1c of 6 back in June.  CBGs have been stable here.     DVT prophylaxis: Lovenox Code Status: Full code Family Communication: No family at bedside Disposition Plan: Needs chest x-ray for better assessment of moderate-sized pleural effusion to sure no need for thoracentesis, continue to closely monitor respiratory status, encourage incentive spirometry for atelectasis.   [facility] Direct contact: see [email or web address] (further directions at bottom of note if needed) 7PM-7AM contact night coverage as at bottom of note 03/13/2019, 5:35 PM  LOS: 4 days   Consultants   Psychiatry  Procedures   None  Antibiotics   Ceftriaxone  Nitrofurantoin  Interval History/Subjective  Still reports belly pain Diminished appetite Denies any shortness of breath  Objective   Vitals:  Vitals:   03/13/19 0923 03/13/19 1240  BP:  119/78  Pulse:  (!) 57  Resp:  12  Temp:  98.2 F (36.8 C)  SpO2: 94% 93%    Exam:  Awake Alert, Oriented X place, context, self, No new F.N deficits, flat affect, irritable mood [place].AT, Symmetrical Chest wall movement,  Good air movement bilaterally, CTAB difficult to assess given body habitus, still on 2 L 02 [place] RRR,No Gallops,Rubs or new Murmurs,  +ve B.Sounds, Abd Soft, some  tenderness with deep palpation all over,  No rebound - guarding or rigidity.     I have personally reviewed the following:   Data Reviewed: Basic Metabolic Panel: Recent Labs  Lab 03/10/19 0850 03/11/19 0550 03/11/19 1327 03/12/19 0514 03/12/19 1309 03/13/19 0823  NA 128* 126* 129* 126* 127* 128*  K 5.1 4.8 4.7 4.8 5.3* 4.8  CL 88* 90* 88* 86* 87* 87*  CO2 29 26 32 29 32 30  GLUCOSE 113* 88 148* 83 124* 95  BUN 6 15 14 17 18 20   CREATININE 0.53 0.51 0.68 0.57 0.74 0.64  CALCIUM 9.1 9.0 9.1 8.9 9.3 9.1  MG 2.0 1.9  --   --   --   --   PHOS 5.3* 5.9*  --   --   --   --    Liver Function Tests: Recent Labs  Lab 03/09/19 1654 03/10/19 0850 03/11/19 0550 03/13/19 0823  AST 11* 8* 8* 12*  ALT 7 7 6 8   ALKPHOS 80 82 70 72  BILITOT 0.4 0.3 0.4 0.5  PROT 7.5 7.2 6.6 6.7  ALBUMIN 3.5 3.5 3.0* 3.3*   Recent Labs  Lab 03/09/19 1654  LIPASE 31   No results for input(s): AMMONIA in the last 168 hours. CBC: Recent Labs  Lab 03/09/19 1654 03/10/19 0254 03/11/19 0550  WBC 9.7 5.4 5.4  NEUTROABS  --   --  3.2  HGB 14.2 13.2 14.2  HCT 43.0 41.3 45.8  MCV 89.2 90.2 92.3  PLT 230 191 211   Cardiac Enzymes: No results for input(s): CKTOTAL, CKMB, CKMBINDEX, TROPONINI in the last 168 hours. BNP (last 3 results) Recent Labs    06/17/18 2133 12/03/18 0957 12/06/18 0911  BNP 125.4* 296.6* 132.3*    ProBNP (last 3 results) No results for input(s): PROBNP in the last 8760 hours.  CBG: Recent Labs  Lab 03/09/19 1645  GLUCAP 100*    Recent Results (from the past 240 hour(s))  Urine Culture     Status: Abnormal   Collection Time: 03/09/19  6:31 PM   Specimen: Urine, Random  Result Value Ref Range Status   Specimen Description   Final    URINE, RANDOM Performed at [facility] [address] [place], [place] [address]    Special Requests   Final    NONE Performed at [facility], [place] [address], [place], [place]  [address]    Culture >=100,000 COLONIES/mL ESCHERICHIA COLI (A)  Final   Report Status 03/12/2019 FINAL  Final   Organism ID, Bacteria ESCHERICHIA COLI (A)  Final      Susceptibility   Escherichia coli - MIC*    AMPICILLIN >=32 RESISTANT Resistant     CEFAZOLIN <=4 SENSITIVE Sensitive     CEFTRIAXONE <=1 SENSITIVE Sensitive     CIPROFLOXACIN >=4 RESISTANT Resistant     GENTAMICIN >=16 RESISTANT Resistant     IMIPENEM <=0.25 SENSITIVE Sensitive     NITROFURANTOIN <=16 SENSITIVE Sensitive     TRIMETH/SULFA <=20 SENSITIVE Sensitive     AMPICILLIN/SULBACTAM 16 INTERMEDIATE Intermediate     PIP/TAZO <=4 SENSITIVE Sensitive     Extended ESBL NEGATIVE Sensitive     * >=100,000 COLONIES/mL ESCHERICHIA COLI  SARS CORONAVIRUS 2 (TAT 6-24 HRS) Nasopharyngeal Nasopharyngeal Swab     Status: None  Collection Time: 03/09/19  6:47 PM   Specimen: Nasopharyngeal Swab  Result Value Ref Range Status   SARS Coronavirus 2 NEGATIVE NEGATIVE Final    Comment: (NOTE) SARS-CoV-2 target nucleic acids are NOT DETECTED. The SARS-CoV-2 RNA is generally detectable in upper and lower respiratory specimens during the acute phase of infection. Negative results do not preclude SARS-CoV-2 infection, do not rule out co-infections with other pathogens, and should not be used as the sole basis for treatment or other patient management decisions. Negative results must be combined with clinical observations, patient history, and epidemiological information. The expected result is Negative. Fact Sheet for Patients: SugarRoll.be Fact Sheet for Healthcare Providers: https://www.woods-mathews.com/ This test is not yet approved or cleared by the [place] FDA and  has been authorized for detection and/or diagnosis of SARS-CoV-2 by FDA under an Emergency Use Authorization (EUA). This EUA will remain  in effect (meaning this test can be used) for the duration of the COVID-19  declaration under Section 56 4(b)(1) of the Act, 21 U.S.C. section 360bbb-3(b)(1), unless the authorization is terminated or revoked sooner. Performed at [facility], [place] [address], [place], [place] [address]   MRSA PCR Screening     Status: Abnormal   Collection Time: 03/11/19 12:20 AM   Specimen: Nasopharyngeal  Result Value Ref Range Status   MRSA by PCR POSITIVE (A) NEGATIVE Final    Comment:        The GeneXpert MRSA Assay (FDA approved for NASAL specimens only), is one component of a comprehensive MRSA colonization surveillance program. It is not intended to diagnose MRSA infection nor to guide or monitor treatment for MRSA infections. RESULT CALLED TO, READ BACK BY AND VERIFIED WITH: VAUGHN,M @ [id number] ON [id number] BY POTEAT,S Performed at [facility], [place] [address], [place],  [address]      Studies: Ct Abdomen Pelvis W Contrast  Result Date: 03/13/2019 CLINICAL DATA:  Abdominal pain, constipation EXAM: CT ABDOMEN AND PELVIS WITH CONTRAST TECHNIQUE: Multidetector CT imaging of the abdomen and pelvis was performed using the standard protocol following bolus administration of intravenous contrast. CONTRAST:  128mL OMNIPAQUE IOHEXOL 300 MG/ML  SOLN COMPARISON:  10/07/2017 FINDINGS: Lower chest: Moderate left pleural effusion. Mild dependent atelectasis in the bilateral lower lobes. Hepatobiliary: Liver is within normal limits. Status post cholecystectomy. No intrahepatic or extrahepatic ductal dilatation. Pancreas: Within normal limits. Spleen: Within normal limits. Adrenals/Urinary Tract: Adrenal glands are within normal limits. Mild right renal cortical scarring. Calyceal diverticulum along the anterior interpolar right kidney. Left kidney is within normal limits. No hydronephrosis. Bladder is within normal limits. Stomach/Bowel: Stomach is notable for a tiny hiatal hernia. No evidence of bowel obstruction. Appendix is within normal limits (series  2/image 55). No colonic wall thickening or inflammatory changes. Vascular/Lymphatic: No evidence of abdominal aortic aneurysm. Atherosclerotic calcifications of the abdominal aorta and branch vessels. No suspicious abdominopelvic lymphadenopathy. Reproductive: Uterus is unremarkable. No adnexal masses. Other: No abdominopelvic ascites. Tiny periumbilical hernia containing fat and a loop of nondilated small bowel (series 2/image 61). Musculoskeletal: Mild superior endplate compression fracture deformity involving T10 through T12. Moderate central compression fracture deformity at L2 with mild retropulsion. These findings are chronic. IMPRESSION: No evidence of bowel obstruction.  Normal appendix. Tiny periumbilical hernia containing fat and a loop of nondilated small bowel. No CT findings to account for the patient's chronic abdominal pain. Moderate left pleural effusion. Mild dependent atelectasis in the bilateral lower lobes. Electronically Signed   By: Julian Hy  M.D.   On: 03/13/2019 15:06   Dg Chest Port 1 View  Result Date: 03/13/2019 CLINICAL DATA:  Pleural effusion, shortness of breath EXAM: PORTABLE CHEST 1 VIEW COMPARISON:  March 09, 2019 FINDINGS: There is mild cardiomegaly. There is blunting of the left costophrenic angle, likely trace effusion. The right lung is clear. No acute osseous abnormality. IMPRESSION: Probable trace left pleural effusion. Electronically Signed   By: Prudencio Pair M.D.   On: 03/13/2019 17:22    Scheduled Meds:  asenapine  10 mg Sublingual BID   aspirin  81 mg Oral Daily   buPROPion  150 mg Oral Daily   Chlorhexidine Gluconate Cloth  6 each Topical Q0600   diazepam  5 mg Oral TID   docusate sodium  100 mg Oral BID   enoxaparin (LOVENOX) injection  40 mg Subcutaneous Q24H   feeding supplement (ENSURE ENLIVE)  237 mL Oral Q24H   feeding supplement (PRO-STAT SUGAR FREE 64)  30 mL Oral BID   fluticasone furoate-vilanterol  1 puff Inhalation Daily     furosemide  20 mg Oral BID   gabapentin  100 mg Oral QHS   hydrOXYzine  50 mg Oral TID   mouth rinse  15 mL Mouth Rinse BID   Melatonin  5 mg Oral QHS   metoprolol tartrate  25 mg Oral BID   multivitamin with minerals  1 tablet Oral Daily   mupirocin ointment  1 application Nasal BID   NIFEdipine  90 mg Oral Daily   nitrofurantoin (macrocrystal-monohydrate)  100 mg Oral Q12H   pantoprazole  40 mg Oral Daily   polyethylene glycol  17 g Oral BID   QUEtiapine  50 mg Oral QHS   sodium chloride (PF)       sodium chloride  1 g Oral TID WC   valproic acid  500 mg Oral BID   Continuous Infusions:  sodium chloride Stopped (03/11/19 2318)    Principal Problem:   Schizoaffective disorder, bipolar type (HCC) Active Problems:   Schizoaffective disorder (HCC)   Essential hypertension   Acute hyponatremia   Anxiety and depression   Agitation   Urinary tract bacterial infections   Type 2 diabetes mellitus without complication ([place])      Desiree Hane  [facility]

## 2019-03-13 NOTE — Discharge Summary (Addendum)
Natalie Thomas [id number] DOB: 04/08/1961 DOA: 03/09/2019  PCP: System, Pcp Not In  Admit date: 03/09/2019 Discharge date: 03/16/2019  Admitted From: ALF Disposition: ALF  Recommendations for Outpatient Follow-up:  1. Follow up with PCP in 1-2 weeks 2. Please obtain BMP in one week(check na) 3. New medications: Nitrofurantoin, prilosec increased to BID dosing 4. Recommend outpatient GI referral for chronic abdominal pain 5. Please follow up on the following pending results:  Home Health:NO Equipment/Devices: Continue supplemental oxygen Discharge Condition: Stable  CODE STATUS: Full Diet recommendation: Heart Healthy  COVID TEST on 9/19 resulted as negative on 9/20  Brief/Interim Summary: History of present illness:  Natalie Thomas is a 58 y.o. year old female with medical history significant for COPD on 2 Loxygen, hypertension, type 2 diabetes,chronic hyponatremia,schizoaffective disorder, depression who presented on 03/09/2019 with increased agitation and labile mood per SNF.  And was found to have acute on chronic hyponatremia of 120, UTI, and hypertensive urgency with BP 200/76 on admission.  ED course: Sodium of 120.  Previous baseline of 130.,  Serum osmolality 257, urine oxmolality106, urine sodium 39, SARSUrine osmolality testing negative, high-sensitivity troponin 7. Ua with moderate leukocytes, positive nitrites, large leukocytes, many bacteriaRemaining hospital course addressed in problem based format below:   Hospital Course:   Hypovolemic hyponatremia, acute on chronic, stable.  Nadir of 120, previous baseline range of 125-130, 127 on discharge.  Patient is on several psychiatric medications that could be contributing as well but seems to be stable. Continue to monitor as outpatient with repeat BMP in a week, advise close monitoring of her fluids as well as she has had polydipsia associated with her levels before   UTI secondary to E.coli.  Based on  sensitivities on nitrofurantoin remains afebrile, symptoms controlled, normal white count.   Mood stabilization/paranoia related to schizoaffective disorder and depression.  Psychiatry evaluated on 9/15.  Recommended increasing Saphris 10 mg twice daily for mood stabilization which she will continue on discharge in addition to her previous home psych regime.   Nonspecific abdominal pain.  Suspect related to constipation.  Abdominal x-ray shows no acute abnormalities, CT abdomen also shows no acute abnormalities shows chronic small periumbilical hernia, patient still having normal BMs.  Patient is a poor historian, continues to report belly pain but states this is chronic in nature, able to tolerate p.o. without any vomiting.  Increased home protonix to BID dosing during hospitalization   Incidental finding of moderate pleural effusion and dependent atelectasis.  Found on CT abdomen imaging.    Chest x-ray showed small effusion, patient stable on home O2.  Encourage incentive spirometer use given some atelectasis.  Also encourage patient to get out of bed to chair (was not adherent to this during hospital stay)    CHF with preserved EF.  No obvious signs of acute exacerbation, no increased volume status, stable O2 requirements.  Continue oral Lasix 20 mg twice daily   HTN, improved currently at goal.    Continue home Lopressor,   COPD on 2 L.  No active wheezing, continue scheduled inhaler therapy   Type 2 diabetes, diet controlled.  A1c of 6 back in June.  CBGs have been stable here   Consultations:  Psychiatry  Procedures/Studies: None Subjective:  Eating all of her breakfast this morning  Discharge Exam: Vitals:   03/16/19 0640 03/16/19 0850  BP: (!) 163/84   Pulse: (!) 47   Resp: 16   Temp: (!) 97.4 F (36.3 C)   SpO2: 96%  98%   Vitals:   03/15/19 1322 03/15/19 2343 03/16/19 0640 03/16/19 0850  BP: 127/72 139/80 (!) 163/84   Pulse: (!) 56 (!) 55 (!) 47    Resp: 16 16 16    Temp: 98.1 F (36.7 C) 98.2 F (36.8 C) (!) 97.4 F (36.3 C)   TempSrc: Oral Oral Oral   SpO2: 98% 95% 96% 98%  Weight:        Awake Alert, Oriented X place, context, self, No new F.N deficits, normal affect, irritable mood [place].AT, Symmetrical Chest wall movement, Good air movement bilaterally, CTAB difficult to assess given body habitus, still on 2 L 02 [place] RRR,No Gallops,Rubs or new Murmurs,  +ve B.Sounds, Abd Soft, non-tender,  No rebound - guarding or rigidity.   Discharge Diagnoses:  Principal Problem:   Schizoaffective disorder, bipolar type ([place]) Active Problems:   DM (diabetes mellitus), type 2 ([place])   Schizoaffective disorder ([place])   Essential hypertension   Acute hyponatremia   Chronic respiratory failure with hypoxia (HCC)   Anxiety and depression   Agitation   Urinary tract bacterial infections   Type 2 diabetes mellitus without complication (HCC)   Pleural effusion   Atelectasis   Chronic diastolic CHF (congestive heart failure) (HCC)   Abdominal pain    Discharge Instructions  Discharge Instructions    Diet - low sodium heart healthy   Complete by: As directed    Diet - low sodium heart healthy   Complete by: As directed    Increase activity slowly   Complete by: As directed    Increase activity slowly   Complete by: As directed      Allergies as of 03/16/2019      Reactions   Clarithromycin Itching   Penicillins Itching   Has tolerated cefazolin before Has patient had a PCN reaction causing immediate rash, facial/tongue/throat swelling, SOB or lightheadedness with hypotension: No Has patient had a PCN reaction causing severe rash involving mucus membranes or skin necrosis: No Has patient had a PCN reaction that required hospitalization: Unknown Has patient had a PCN reaction occurring within the last 10 years: No If all of the above answers are "NO", then may proceed with Cephalosporin use.      Medication List    TAKE  these medications   acetaminophen 325 MG tablet Commonly known as: TYLENOL Take 650 mg by mouth 2 (two) times daily as needed (pain). Do not exceed 3 grams/24 hours of tylenol from all sources   acetaminophen 500 MG tablet Commonly known as: TYLENOL Take 500 mg by mouth every 6 (six) hours as needed for fever.   albuterol (2.5 MG/3ML) 0.083% nebulizer solution Commonly known as: PROVENTIL Take 3 mLs (2.5 mg total) by nebulization every 4 (four) hours as needed for wheezing or shortness of breath.   aluminum-magnesium hydroxide-simethicone [id number] MG/5ML Susp Commonly known as: MAALOX Take 30 mLs by mouth 4 (four) times daily as needed (heartburn, indigestion).   arformoterol 15 MCG/2ML Nebu Commonly known as: BROVANA Take 2 mLs (15 mcg total) by nebulization 2 (two) times daily.   aspirin 81 MG tablet Take 1 tablet (81 mg total) by mouth daily. For heart health   benzonatate 100 MG capsule Commonly known as: TESSALON Take 1 capsule (100 mg total) by mouth 3 (three) times daily as needed for cough.   Breo Ellipta 100-25 MCG/INH Aepb Generic drug: fluticasone furoate-vilanterol Inhale 1 puff into the lungs daily.   budesonide 0.5 MG/2ML nebulizer solution Commonly known as: PULMICORT Take  2 mLs (0.5 mg total) by nebulization 2 (two) times daily.   buPROPion 150 MG 24 hr tablet Commonly known as: WELLBUTRIN XL Take 150 mg by mouth daily.   calcium carbonate 500 MG chewable tablet Commonly known as: TUMS - dosed in mg elemental calcium Chew 1 tablet (200 mg of elemental calcium total) by mouth 2 (two) times daily as needed for indigestion or heartburn.   Calcium-Vitamin D-Minerals 600-800 MG-UNIT Chew Chew 1 tablet by mouth daily.   Dermacloud Crea Apply 1 application topically 3 (three) times daily.   diazepam 5 MG tablet Commonly known as: VALIUM Take 5 mg by mouth 3 (three) times daily.   diazepam 2 MG tablet Commonly known as: VALIUM Take 1 tablet (2 mg  total) by mouth 2 (two) times a day.   docusate sodium 100 MG capsule Commonly known as: COLACE Take 100 mg by mouth 2 (two) times daily.   feeding supplement (ENSURE ENLIVE) Liqd Take 237 mLs by mouth daily.   feeding supplement (PRO-STAT SUGAR FREE 64) Liqd Take 30 mLs by mouth 2 (two) times daily.   fluticasone 50 MCG/ACT nasal spray Commonly known as: FLONASE Place 2 sprays into both nostrils 2 (two) times daily.   furosemide 20 MG tablet Commonly known as: LASIX Take 20 mg by mouth 2 (two) times daily.   gabapentin 100 MG capsule Commonly known as: NEURONTIN Take 1 capsule (100 mg total) by mouth at bedtime.   hydrOXYzine 50 MG tablet Commonly known as: ATARAX/VISTARIL Take 50 mg by mouth 3 (three) times daily.   hydrOXYzine 25 MG tablet Commonly known as: ATARAX/VISTARIL Take 1 tablet (25 mg total) by mouth 3 (three) times daily as needed for anxiety.   ipratropium-albuterol 0.5-2.5 (3) MG/3ML Soln Commonly known as: DUONEB Take 3 mLs by nebulization See admin instructions. Inhale one vial via hand held nebulizer three times daily, may also use every 8 hours as needed for shortness of breath   lurasidone 40 MG Tabs tablet Commonly known as: LATUDA Take 1 tablet (40 mg total) by mouth 2 (two) times a day.   Melatonin 5 MG Tabs Take 5 mg by mouth at bedtime.   methocarbamol 500 MG tablet Commonly known as: ROBAXIN Take 500 mg by mouth every 8 (eight) hours as needed for muscle spasms.   metoprolol tartrate 25 MG tablet Commonly known as: LOPRESSOR Take 1 tablet (25 mg total) by mouth 2 (two) times daily.   multivitamin with minerals Tabs tablet Take 1 tablet by mouth daily.   nicotine 21 mg/24hr patch Commonly known as: NICODERM CQ - dosed in mg/24 hours Place 1 patch (21 mg total) onto the skin daily.   NIFEdipine 90 MG 24 hr tablet Commonly known as: PROCARDIA XL/NIFEDICAL-XL Take 90 mg by mouth daily.   nitrofurantoin (macrocrystal-monohydrate) 100  MG capsule Commonly known as: MACROBID Take 1 capsule (100 mg total) by mouth every 12 (twelve) hours for 2 days.   omeprazole 40 MG capsule Commonly known as: PRILOSEC Take 1 capsule (40 mg total) by mouth 2 (two) times daily. What changed:   when to take this  Another medication with the same name was removed. Continue taking this medication, and follow the directions you see here.   oxyCODONE 5 MG immediate release tablet Commonly known as: Oxy IR/ROXICODONE Take 1 tablet (5 mg total) by mouth daily. What changed:   when to take this  reasons to take this   polyethylene glycol 17 g packet Commonly known as: MiraLax Take  17 g by mouth daily. What changed: when to take this   QUEtiapine 50 MG tablet Commonly known as: SEROQUEL Take 1 tablet (50 mg total) by mouth at bedtime. What changed: when to take this   QUEtiapine 25 MG tablet Commonly known as: SEROQUEL Take 1 tablet (25 mg total) by mouth daily. What changed: Another medication with the same name was changed. Make sure you understand how and when to take each.   Robafen 100 MG/5ML syrup Generic drug: guaifenesin Take 200 mg by mouth 3 (three) times daily as needed for cough or congestion.   guaiFENesin 600 MG 12 hr tablet Commonly known as: MUCINEX Take 1,200 mg by mouth daily.   Saphris 5 MG Subl 24 hr tablet Generic drug: asenapine Place 2 tablets (10 mg total) under the tongue 2 (two) times daily. What changed: how much to take   sodium chloride 1 g tablet Take 1 tablet (1 g total) by mouth 3 (three) times daily with meals.   traZODone 50 MG tablet Commonly known as: DESYREL Take 1 tablet (50 mg total) by mouth at bedtime.   valproic acid 250 MG capsule Commonly known as: DEPAKENE Take 2 capsules (500 mg total) by mouth 2 (two) times daily.       Allergies  Allergen Reactions  . Clarithromycin Itching  . Penicillins Itching    Has tolerated cefazolin before  Has patient had a PCN  reaction causing immediate rash, facial/tongue/throat swelling, SOB or lightheadedness with hypotension: No Has patient had a PCN reaction causing severe rash involving mucus membranes or skin necrosis: No Has patient had a PCN reaction that required hospitalization: Unknown Has patient had a PCN reaction occurring within the last 10 years: No If all of the above answers are "NO", then may proceed with Cephalosporin use.         The results of significant diagnostics from this hospitalization (including imaging, microbiology, ancillary and laboratory) are listed below for reference.     Microbiology: Recent Results (from the past 240 hour(s))  Urine Culture     Status: Abnormal   Collection Time: 03/09/19  6:31 PM   Specimen: Urine, Random  Result Value Ref Range Status   Specimen Description   Final    URINE, RANDOM Performed at [facility] [address], [place], [place] [address]    Special Requests   Final    NONE Performed at [facility], [place] [address], [place], [place] [address]    Culture >=100,000 COLONIES/mL ESCHERICHIA COLI (A)  Final   Report Status 03/12/2019 FINAL  Final   Organism ID, Bacteria ESCHERICHIA COLI (A)  Final      Susceptibility   Escherichia coli - MIC*    AMPICILLIN >=32 RESISTANT Resistant     CEFAZOLIN <=4 SENSITIVE Sensitive     CEFTRIAXONE <=1 SENSITIVE Sensitive     CIPROFLOXACIN >=4 RESISTANT Resistant     GENTAMICIN >=16 RESISTANT Resistant     IMIPENEM <=0.25 SENSITIVE Sensitive     NITROFURANTOIN <=16 SENSITIVE Sensitive     TRIMETH/SULFA <=20 SENSITIVE Sensitive     AMPICILLIN/SULBACTAM 16 INTERMEDIATE Intermediate     PIP/TAZO <=4 SENSITIVE Sensitive     Extended ESBL NEGATIVE Sensitive     * >=100,000 COLONIES/mL ESCHERICHIA COLI  SARS CORONAVIRUS 2 (TAT 6-24 HRS) Nasopharyngeal Nasopharyngeal Swab     Status: None   Collection Time: 03/09/19  6:47 PM   Specimen: Nasopharyngeal Swab   Result Value Ref Range Status  SARS Coronavirus 2 NEGATIVE NEGATIVE Final    Comment: (NOTE) SARS-CoV-2 target nucleic acids are NOT DETECTED. The SARS-CoV-2 RNA is generally detectable in upper and lower respiratory specimens during the acute phase of infection. Negative results do not preclude SARS-CoV-2 infection, do not rule out co-infections with other pathogens, and should not be used as the sole basis for treatment or other patient management decisions. Negative results must be combined with clinical observations, patient history, and epidemiological information. The expected result is Negative. Fact Sheet for Patients: SugarRoll.be Fact Sheet for Healthcare Providers: https://www.woods-mathews.com/ This test is not yet approved or cleared by the [place] FDA and  has been authorized for detection and/or diagnosis of SARS-CoV-2 by FDA under an Emergency Use Authorization (EUA). This EUA will remain  in effect (meaning this test can be used) for the duration of the COVID-19 declaration under Section 56 4(b)(1) of the Act, 21 U.S.C. section 360bbb-3(b)(1), unless the authorization is terminated or revoked sooner. Performed at [facility], [place] [address] [place], [place] [address]   MRSA PCR Screening     Status: Abnormal   Collection Time: 03/11/19 12:20 AM   Specimen: Nasopharyngeal  Result Value Ref Range Status   MRSA by PCR POSITIVE (A) NEGATIVE Final    Comment:        The GeneXpert MRSA Assay (FDA approved for NASAL specimens only), is one component of a comprehensive MRSA colonization surveillance program. It is not intended to diagnose MRSA infection nor to guide or monitor treatment for MRSA infections. RESULT CALLED TO, READ BACK BY AND VERIFIED WITH: VAUGHN,M @ [id number] ON [id number] BY POTEAT,S Performed at [facility], [place] [address], [place], [place] [address]   Novel Coronavirus, NAA  (hospital order; send-out to ref lab)     Status: None   Collection Time: 03/14/19  5:50 PM   Specimen: Nasopharyngeal Swab; Respiratory  Result Value Ref Range Status   SARS-CoV-2, NAA NOT DETECTED NOT DETECTED Final    Comment: (NOTE) This nucleic acid amplification test was developed and its performance characteristics determined by [employer]. Nucleic acid amplification tests include PCR and TMA. This test has not been FDA cleared or approved. This test has been authorized by FDA under an Emergency Use Authorization (EUA). This test is only authorized for the duration of time the declaration that circumstances exist justifying the authorization of the emergency use of in vitro diagnostic tests for detection of SARS-CoV-2 virus and/or diagnosis of COVID-19 infection under section 564(b)(1) of the Act, 21 U.S.C. [id number]) (1), unless the authorization is terminated or revoked sooner. When diagnostic testing is negative, the possibility of a false negative result should be considered in the context of a patient's recent exposures and the presence of clinical signs and symptoms consistent with COVID-19. An individual without symptoms of COVID- 19 and who is not shedding SARS-CoV-2 vi rus would expect to have a negative (not detected) result in this assay. Performed At: [facility] [place], [place] [id number] Rush Farmer MD [id number]    [place]  Final    Comment: Performed at [facility] [address], [place], [place] [address]     Labs: BNP (last 3 results) Recent Labs    06/17/18 2133 12/03/18 0957 12/06/18 0911  BNP 125.4* 296.6* [id number]   Basic Metabolic Panel: Recent Labs  Lab 03/10/19 0850 03/11/19 0550 03/11/19 1327 03/12/19 0514 03/12/19 1309 03/13/19 0823 03/14/19 0436 03/16/19 0538  NA 128*  126* 129* 126* 127* 128* 127*  --   K 5.1 4.8 4.7 4.8 5.3* 4.8 4.6  --   CL  88* 90* 88* 86* 87* 87* 88*  --   CO2 29 26 32 29 32 30 30  --   GLUCOSE 113* 88 148* 83 124* 95 101*  --   BUN 6 15 14 17 18 20 20   --   CREATININE 0.53 0.51 0.68 0.57 0.74 0.64 0.55 0.65  CALCIUM 9.1 9.0 9.1 8.9 9.3 9.1 9.0  --   MG 2.0 1.9  --   --   --   --   --   --   PHOS 5.3* 5.9*  --   --   --   --   --   --    Liver Function Tests: Recent Labs  Lab 03/09/19 1654 03/10/19 0850 03/11/19 0550 03/13/19 0823  AST 11* 8* 8* 12*  ALT 7 7 6 8   ALKPHOS 80 82 70 72  BILITOT 0.4 0.3 0.4 0.5  PROT 7.5 7.2 6.6 6.7  ALBUMIN 3.5 3.5 3.0* 3.3*   Recent Labs  Lab 03/09/19 1654  LIPASE 31   No results for input(s): AMMONIA in the last 168 hours. CBC: Recent Labs  Lab 03/09/19 1654 03/10/19 0254 03/11/19 0550  WBC 9.7 5.4 5.4  NEUTROABS  --   --  3.2  HGB 14.2 13.2 14.2  HCT 43.0 41.3 45.8  MCV 89.2 90.2 92.3  PLT 230 191 211   Cardiac Enzymes: No results for input(s): CKTOTAL, CKMB, CKMBINDEX, TROPONINI in the last 168 hours. BNP: Invalid input(s): POCBNP CBG: Recent Labs  Lab 03/09/19 1645  GLUCAP 100*   D-Dimer No results for input(s): DDIMER in the last 72 hours. Hgb A1c No results for input(s): HGBA1C in the last 72 hours. Lipid Profile No results for input(s): CHOL, HDL, LDLCALC, TRIG, CHOLHDL, LDLDIRECT in the last 72 hours. Thyroid function studies No results for input(s): TSH, T4TOTAL, T3FREE, THYROIDAB in the last 72 hours.  Invalid input(s): FREET3 Anemia work up No results for input(s): VITAMINB12, FOLATE, FERRITIN, TIBC, IRON, RETICCTPCT in the last 72 hours. Urinalysis    Component Value Date/Time   COLORURINE STRAW (A) 03/09/2019 1655   APPEARANCEUR HAZY (A) 03/09/2019 1655   APPEARANCEUR Clear 06/22/2012 0618   LABSPEC 1.002 (L) 03/09/2019 1655   LABSPEC 1.003 06/22/2012 0618   PHURINE 7.0 03/09/2019 1655   GLUCOSEU NEGATIVE 03/09/2019 1655   GLUCOSEU Negative 06/22/2012 0618   GLUCOSEU NEGATIVE 04/09/2011 1058   HGBUR SMALL (A)  03/09/2019 1655   BILIRUBINUR NEGATIVE 03/09/2019 1655   BILIRUBINUR Negative 06/22/2012 0618   KETONESUR NEGATIVE 03/09/2019 1655   PROTEINUR NEGATIVE 03/09/2019 1655   UROBILINOGEN 1.0 08/18/2011 1805   NITRITE POSITIVE (A) 03/09/2019 1655   LEUKOCYTESUR LARGE (A) 03/09/2019 1655   LEUKOCYTESUR Trace 06/22/2012 0618   Sepsis Labs Invalid input(s): PROCALCITONIN,  WBC,  LACTICIDVEN Microbiology Recent Results (from the past 240 hour(s))  Urine Culture     Status: Abnormal   Collection Time: 03/09/19  6:31 PM   Specimen: Urine, Random  Result Value Ref Range Status   Specimen Description   Final    URINE, RANDOM Performed at [facility], [place] [address], [place], [place] [address]    Special Requests   Final    NONE Performed at [facility], [place] [address], [place],  [address]    Culture >=100,000 COLONIES/mL ESCHERICHIA COLI (A)  Final  Report Status 03/12/2019 FINAL  Final   Organism ID, Bacteria ESCHERICHIA COLI (A)  Final      Susceptibility   Escherichia coli - MIC*    AMPICILLIN >=32 RESISTANT Resistant     CEFAZOLIN <=4 SENSITIVE Sensitive     CEFTRIAXONE <=1 SENSITIVE Sensitive     CIPROFLOXACIN >=4 RESISTANT Resistant     GENTAMICIN >=16 RESISTANT Resistant     IMIPENEM <=0.25 SENSITIVE Sensitive     NITROFURANTOIN <=16 SENSITIVE Sensitive     TRIMETH/SULFA <=20 SENSITIVE Sensitive     AMPICILLIN/SULBACTAM 16 INTERMEDIATE Intermediate     PIP/TAZO <=4 SENSITIVE Sensitive     Extended ESBL NEGATIVE Sensitive     * >=100,000 COLONIES/mL ESCHERICHIA COLI  SARS CORONAVIRUS 2 (TAT 6-24 HRS) Nasopharyngeal Nasopharyngeal Swab     Status: None   Collection Time: 03/09/19  6:47 PM   Specimen: Nasopharyngeal Swab  Result Value Ref Range Status   SARS Coronavirus 2 NEGATIVE NEGATIVE Final    Comment: (NOTE) SARS-CoV-2 target nucleic acids are NOT DETECTED. The SARS-CoV-2 RNA is generally detectable in upper and  lower respiratory specimens during the acute phase of infection. Negative results do not preclude SARS-CoV-2 infection, do not rule out co-infections with other pathogens, and should not be used as the sole basis for treatment or other patient management decisions. Negative results must be combined with clinical observations, patient history, and epidemiological information. The expected result is Negative. Fact Sheet for Patients: SugarRoll.be Fact Sheet for Healthcare Providers: https://www.woods-mathews.com/ This test is not yet approved or cleared by the [place] FDA and  has been authorized for detection and/or diagnosis of SARS-CoV-2 by FDA under an Emergency Use Authorization (EUA). This EUA will remain  in effect (meaning this test can be used) for the duration of the COVID-19 declaration under Section 56 4(b)(1) of the Act, 21 U.S.C. section 360bbb-3(b)(1), unless the authorization is terminated or revoked sooner. Performed at [facility], [place] [address], [place], [place] [address]   MRSA PCR Screening     Status: Abnormal   Collection Time: 03/11/19 12:20 AM   Specimen: Nasopharyngeal  Result Value Ref Range Status   MRSA by PCR POSITIVE (A) NEGATIVE Final    Comment:        The GeneXpert MRSA Assay (FDA approved for NASAL specimens only), is one component of a comprehensive MRSA colonization surveillance program. It is not intended to diagnose MRSA infection nor to guide or monitor treatment for MRSA infections. RESULT CALLED TO, READ BACK BY AND VERIFIED WITH: VAUGHN,M @ [id number] ON [id number] BY POTEAT,S Performed at [facility], [place] [address], [place],  [address]   Novel Coronavirus, NAA (hospital order; send-out to ref lab)     Status: None   Collection Time: 03/14/19  5:50 PM   Specimen: Nasopharyngeal Swab; Respiratory  Result Value Ref Range Status   SARS-CoV-2, NAA NOT DETECTED NOT  DETECTED Final    Comment: (NOTE) This nucleic acid amplification test was developed and its performance characteristics determined by [employer]. Nucleic acid amplification tests include PCR and TMA. This test has not been FDA cleared or approved. This test has been authorized by FDA under an Emergency Use Authorization (EUA). This test is only authorized for the duration of time the declaration that circumstances exist justifying the authorization of the emergency use of in vitro diagnostic tests for detection of SARS-CoV-2 virus and/or diagnosis of COVID-19 infection under section 564(b)(1) of the Act, 21 U.S.C. [id number]) (1),  unless the authorization is terminated or revoked sooner. When diagnostic testing is negative, the possibility of a false negative result should be considered in the context of a patient's recent exposures and the presence of clinical signs and symptoms consistent with COVID-19. An individual without symptoms of COVID- 19 and who is not shedding SARS-CoV-2 vi rus would expect to have a negative (not detected) result in this assay. Performed At: [facility] [place], [place] [id number] Rush Farmer MD [id number]    [place]  Final    Comment: Performed at [facility] [address], [place],  [address]     Time coordinating discharge: Over 30 minutes  SIGNED:   Desiree Hane, MD  [facility] 03/16/2019, 12:11 PM Pager   If 7PM-7AM, please contact night-coverage [email or web address] Password [email or web address]

## 2019-03-14 LAB — BASIC METABOLIC PANEL
Anion gap: 9 (ref 5–15)
BUN: 20 mg/dL (ref 6–20)
CO2: 30 mmol/L (ref 22–32)
Calcium: 9 mg/dL (ref 8.9–10.3)
Chloride: 88 mmol/L — ABNORMAL LOW (ref 98–111)
Creatinine, Ser: 0.55 mg/dL (ref 0.44–1.00)
GFR calc Af Amer: >60 mL/min (ref >60)
GFR calc non Af Amer: >60 mL/min (ref >60)
Glucose, Bld: 101 mg/dL — ABNORMAL HIGH (ref 70–99)
Potassium: 4.6 mmol/L (ref 3.5–5.1)
Sodium: 127 mmol/L — ABNORMAL LOW (ref 135–145)

## 2019-03-14 MED ORDER — SAPHRIS 5 MG SL SUBL: 10.0000 mg | SUBLINGUAL_TABLET | Freq: Two times a day (BID) | SUBLINGUAL | 0 refills | Status: DC

## 2019-03-14 MED ORDER — ADULT MULTIVITAMIN W/MINERALS CH: 1.0000 | ORAL_TABLET | Freq: Every day | ORAL | Status: AC

## 2019-03-14 MED ORDER — PRO-STAT SUGAR FREE PO LIQD: 30.0000 mL | Freq: Two times a day (BID) | ORAL | 0 refills | Status: DC

## 2019-03-14 MED ORDER — NITROFURANTOIN MONOHYD MACRO 100 MG PO CAPS: 100.0000 mg | ORAL_CAPSULE | Freq: Two times a day (BID) | ORAL | 0 refills | Status: AC

## 2019-03-14 MED ORDER — ENSURE ENLIVE PO LIQD: 237.0000 mL | ORAL | 12 refills | Status: DC

## 2019-03-14 NOTE — TOC Transition Note (Addendum)
Transition of Care [facility]) - CM/SW Discharge Note   Patient Details  Name: Natalie Thomas MRN: [id number] Date of Birth: 1960/07/27  Transition of Care [facility]) CM/SW Contact:  Nila Nephew, LCSW Phone Number: weekend coverage [phone] 03/14/2019, 3:37 PM   Clinical Narrative:   Assisting with pt's return to [facility] assisted living. Provided DC summary and FL2 for care manager Mardene Celeste to review. Mardene Celeste is checking to see if medications needed are available at facility through the weekend - pharmacy is closed until Monday. Once determined will call back to advise if pt can return today.  16:13 update: Mardene Celeste at [facility] reports unable to supply pt's medications until Monday due to pharmacy hours. ALF requires covid test within 4 days of return, today was 4 days since original test so new test ordered planning for pt's return on Monday. Hard scripts for new/changed medications needed to go with pt at DC.     Barriers to Discharge: medications needed at facility, covid test  Patient Goals and CMS Choice    Assisted living  N/a- is resident at [facility]    Discharge Placement                 return to ALF      Discharge Plan and Services   Discharge Planning Services: CM Consult                                 Social Determinants of Health (SDOH) Interventions     Readmission Risk Interventions Readmission Risk Prevention Plan 03/10/2019 12/08/2018  Transportation Screening Complete Complete  Medication Review Press photographer) Complete Complete  PCP or Specialist appointment within 3-5 days of discharge Complete Complete  HRI or Home Care Consult Complete Complete  SW Recovery Care/Counseling Consult Complete Complete  Palliative Care Screening Not Applicable Not [place] Complete Complete  Some recent data might be hidden

## 2019-03-14 NOTE — Evaluation (Signed)
Physical Therapy Evaluation Patient Details Name: SMITHA CLAYWELL MRN: [id number] DOB: 10-08-1960 Today's Date: 03/14/2019   History of Present Illness  58 y.o. year old female with medical history significant for COPD on 2 L oxygen, hypertension, type 2 diabetes, chronic hyponatremia, schizoaffective disorder, depression who presented on 03/09/2019 with increased agitation and labile mood per SNF. admitted with Hypovolemic hyponatremia, and UTI  Clinical Impression  Pt admitted with above diagnosis.  Pt will benefit from PT in acute setting.  Difficult to determine pt baseline as she is not a fully reliable historian. She may benefit from HHPT at ALF post acute.  Pt currently with functional limitations due to the deficits listed below (see PT Problem List). Pt will benefit from skilled PT to increase their independence and safety with mobility to allow discharge to the venue listed below.       Follow Up Recommendations Other (comment)(f/u PT at ALF)    Equipment Recommendations  None recommended by PT    Recommendations for Other Services       Precautions / Restrictions Precautions Precautions: Fall Restrictions Weight Bearing Restrictions: No      Mobility  Bed Mobility Overal bed mobility: Needs Assistance Bed Mobility: Supine to Sit     Supine to sit: Min guard     General bed mobility comments: for safety  Transfers Overall transfer level: Needs assistance Equipment used: Rolling walker (2 wheeled) Transfers: Sit to/from Stand Sit to Stand: Min assist         General transfer comment: cues for hand placement and overall safety  Ambulation/Gait Ambulation/Gait assistance: Min guard;Min assist   Assistive device: Rolling walker (2 wheeled) Gait Pattern/deviations: Step-through pattern;Wide base of support;Decreased stride length Gait velocity: decr   General Gait Details: cues for RW safety  Stairs            Wheelchair Mobility    Modified  Rankin (Stroke Patients Only)       Balance Overall balance assessment: Needs assistance           Standing balance-Leahy Scale: Poor Standing balance comment: reliant on UEs                             Pertinent Vitals/Pain      Home Living Family/patient expects to be discharged to:: Assisted living                      Prior Function           Comments: unsure, pt is unreliable historian. she does state that she mostly used w/c, she sometimes walks shorter distances with RW however not as much as she would like     Hand Dominance        Extremity/Trunk Assessment   Upper Extremity Assessment Upper Extremity Assessment: Overall WFL for tasks assessed    Lower Extremity Assessment Lower Extremity Assessment: Generalized weakness       Communication   Communication: No difficulties  Cognition Arousal/Alertness: Awake/alert Behavior During Therapy: WFL for tasks assessed/performed Overall Cognitive Status: Within Functional Limits for tasks assessed                                        General Comments      Exercises General Exercises - Lower Extremity Ankle Circles/Pumps: AROM;Both;10 reps   Assessment/Plan  PT Assessment Patient needs continued PT services  PT Problem List Decreased strength;Decreased activity tolerance;Decreased mobility;Decreased knowledge of use of DME;Pain       PT Treatment Interventions DME instruction;Gait training;Functional mobility training;Therapeutic activities;Therapeutic exercise;Patient/family education;Balance training    PT Goals (Current goals can be found in the Care Plan section)  Acute Rehab PT Goals PT Goal Formulation: With patient Time For Goal Achievement: 03/27/19 Potential to Achieve Goals: Good    Frequency Min 3X/week   Barriers to discharge        Co-evaluation               AM-PAC PT "6 Clicks" Mobility  Outcome Measure Help needed  turning from your back to your side while in a flat bed without using bedrails?: A Little Help needed moving from lying on your back to sitting on the side of a flat bed without using bedrails?: A Little Help needed moving to and from a bed to a chair (including a wheelchair)?: A Little Help needed standing up from a chair using your arms (e.g., wheelchair or bedside chair)?: A Little Help needed to walk in hospital room?: A Little Help needed climbing 3-5 steps with a railing? : A Little 6 Click Score: 18    End of Session Equipment Utilized During Treatment: Gait belt Activity Tolerance: Patient tolerated treatment well Patient left: with call bell/phone within reach;with family/visitor present;with chair alarm set   PT Visit Diagnosis: Difficulty in walking, not elsewhere classified (R26.2)    Time: [id number] PT Time Calculation (min) (ACUTE ONLY): 19 min   Charges:   PT Evaluation $PT Eval Low Complexity: 1 Low          Kenyon Ana, PT  Pager: [phone] Acute Rehab Dept [facility]): [id number]   03/14/2019   [facility] 03/14/2019, 12:53 PM

## 2019-03-14 NOTE — Progress Notes (Signed)
[facility]  PROGRESS NOTE  Natalie Thomas [id number] DOB: Feb 12, 1961 DOA: 03/09/2019 PCP: System, Pcp Not In  Brief History    Natalie Thomas is a 58 y.o. year old female with medical history significant for COPD on 2 Loxygen, hypertension, type 2 diabetes,chronic hyponatremia,schizoaffective disorder, depression who presented on 03/09/2019 with increased agitation and labile mood per SNF.  And was found to have acute on chronic hyponatremia of 120, UTI, and hypertensive urgency with BP 200/76 on admission.  ED course: Sodium of 120.  Previous baseline of 130.,  Serum osmolality 257, urine oxmolality106, urine sodium 39, SARSUrine osmolality testing negative, high-sensitivity troponin 7. Ua with moderate leukocytes, positive nitrites, large leukocytes, many bacteria  A & P     Hypovolemic hyponatremia, acute on chronic, stable.  Nadir of 120, previous baseline range of 125-130, Currently 127. Patient is on several psychiatric medications that could be contributing as well but seems to be improving. Continue to monitor, will restrict some of her fluids as she has had polydipsia associated with her levels before   UTI secondary to E.coli.  Based on sensitivities on nitrofurantoin remains afebrile, symptoms controlled, normal white count.   Mood stabilization/paranoia related to schizoaffective disorder and depression.  Psychiatry evaluated on 9/15.  Recommended increasing Saphris 10 mg twice daily for mood stabilization, with close monitoring of QTC.  Continue previous home psych regimen.   Nonspecific abdominal pain.  Suspect related to constipation.  Abdominal x-ray shows no acute abnormalities, CT abdomen also shows no acute abnormalities shows chronic hernia, patient still having normal BMs will continue bowel regimen and monitor closely, nontoxic/nonacute abdominal exam   Incidental finding of moderate pleural effusion and dependent atelectasis.  Found on CT abdomen  imaging.  CXR showed trace effusion, stable on home O2 requirements.    CHF with preserved EF.  No obvious signs of acute exacerbation, no increased volume status, stable O2 requirements does have effusion but unclear association.  Closely monitor, daily weights, monitor output.  Continue oral Lasix 20 mg twice daily   HTN, improved currently at goal.  SBP's in the 130s..  Continue home Lopressor,   COPD on 2 L.  No active wheezing, continue scheduled inhaler therapy   Type 2 diabetes, diet controlled.  A1c of 6 back in June.  CBGs have been stable here.     DVT prophylaxis: Lovenox Code Status: Full code Family Communication: No family at bedside Disposition Plan: Medically stable for discharge, repeating COVID for return back to ALF  [facility] Direct contact: see [email or web address] (further directions at bottom of note if needed) 7PM-7AM contact night coverage as at bottom of note 03/14/2019, 6:38 PM  LOS: 5 days   Consultants   Psychiatry  Procedures   None  Antibiotics   Ceftriaxone  Nitrofurantoin  Interval History/Subjective  Still reports belly pain Diminished appetite, however nursing notes patient ate all her breakfast and most of the day last night without any nausea/vomiting Denies any shortness of breath  Objective   Vitals:  Vitals:   03/14/19 1103 03/14/19 1411  BP:  100/60  Pulse:  (!) 59  Resp:  14  Temp:  98.6 F (37 C)  SpO2: 98% 96%    Exam:  Awake Alert, Oriented X place, context, self, No new F.N deficits, flat affect, irritable mood [place].AT, Symmetrical Chest wall movement, Good air movement bilaterally, CTAB difficult to assess given body habitus, still on 2 L 02  RRR,No Gallops,Rubs or new Murmurs,  +ve B.Sounds, Abd  Soft, some tenderness with deep palpation all over,  No rebound - guarding or rigidity.     I have personally reviewed the following:   Data Reviewed: Basic Metabolic Panel: Recent Labs  Lab 03/10/19 0850  03/11/19 0550 03/11/19 1327 03/12/19 0514 03/12/19 1309 03/13/19 0823 03/14/19 0436  NA 128* 126* 129* 126* 127* 128* 127*  K 5.1 4.8 4.7 4.8 5.3* 4.8 4.6  CL 88* 90* 88* 86* 87* 87* 88*  CO2 29 26 32 29 32 30 30  GLUCOSE 113* 88 148* 83 124* 95 101*  BUN 6 15 14 17 18 20 20   CREATININE 0.53 0.51 0.68 0.57 0.74 0.64 0.55  CALCIUM 9.1 9.0 9.1 8.9 9.3 9.1 9.0  MG 2.0 1.9  --   --   --   --   --   PHOS 5.3* 5.9*  --   --   --   --   --    Liver Function Tests: Recent Labs  Lab 03/09/19 1654 03/10/19 0850 03/11/19 0550 03/13/19 0823  AST 11* 8* 8* 12*  ALT 7 7 6 8   ALKPHOS 80 82 70 72  BILITOT 0.4 0.3 0.4 0.5  PROT 7.5 7.2 6.6 6.7  ALBUMIN 3.5 3.5 3.0* 3.3*   Recent Labs  Lab 03/09/19 1654  LIPASE 31   No results for input(s): AMMONIA in the last 168 hours. CBC: Recent Labs  Lab 03/09/19 1654 03/10/19 0254 03/11/19 0550  WBC 9.7 5.4 5.4  NEUTROABS  --   --  3.2  HGB 14.2 13.2 14.2  HCT 43.0 41.3 45.8  MCV 89.2 90.2 92.3  PLT 230 191 211   Cardiac Enzymes: No results for input(s): CKTOTAL, CKMB, CKMBINDEX, TROPONINI in the last 168 hours. BNP (last 3 results) Recent Labs    06/17/18 2133 12/03/18 0957 12/06/18 0911  BNP 125.4* 296.6* 132.3*    ProBNP (last 3 results) No results for input(s): PROBNP in the last 8760 hours.  CBG: Recent Labs  Lab 03/09/19 1645  GLUCAP 100*    Recent Results (from the past 240 hour(s))  Urine Culture     Status: Abnormal   Collection Time: 03/09/19  6:31 PM   Specimen: Urine, Random  Result Value Ref Range Status   Specimen Description   Final    URINE, RANDOM Performed at [facility] [address] [place], [place] [address]    Special Requests   Final    NONE Performed at [facility], [place] [address] [place], [place] [address]    Culture >=100,000 COLONIES/mL ESCHERICHIA COLI (A)  Final   Report Status 03/12/2019 FINAL  Final   Organism ID, Bacteria ESCHERICHIA  COLI (A)  Final      Susceptibility   Escherichia coli - MIC*    AMPICILLIN >=32 RESISTANT Resistant     CEFAZOLIN <=4 SENSITIVE Sensitive     CEFTRIAXONE <=1 SENSITIVE Sensitive     CIPROFLOXACIN >=4 RESISTANT Resistant     GENTAMICIN >=16 RESISTANT Resistant     IMIPENEM <=0.25 SENSITIVE Sensitive     NITROFURANTOIN <=16 SENSITIVE Sensitive     TRIMETH/SULFA <=20 SENSITIVE Sensitive     AMPICILLIN/SULBACTAM 16 INTERMEDIATE Intermediate     PIP/TAZO <=4 SENSITIVE Sensitive     Extended ESBL NEGATIVE Sensitive     * >=100,000 COLONIES/mL ESCHERICHIA COLI  SARS CORONAVIRUS 2 (TAT 6-24 HRS) Nasopharyngeal Nasopharyngeal Swab     Status: None   Collection Time: 03/09/19  6:47 PM   Specimen:  Nasopharyngeal Swab  Result Value Ref Range Status   SARS Coronavirus 2 NEGATIVE NEGATIVE Final    Comment: (NOTE) SARS-CoV-2 target nucleic acids are NOT DETECTED. The SARS-CoV-2 RNA is generally detectable in upper and lower respiratory specimens during the acute phase of infection. Negative results do not preclude SARS-CoV-2 infection, do not rule out co-infections with other pathogens, and should not be used as the sole basis for treatment or other patient management decisions. Negative results must be combined with clinical observations, patient history, and epidemiological information. The expected result is Negative. Fact Sheet for Patients: SugarRoll.be Fact Sheet for Healthcare Providers: https://www.woods-mathews.com/ This test is not yet approved or cleared by the [place] FDA and  has been authorized for detection and/or diagnosis of SARS-CoV-2 by FDA under an Emergency Use Authorization (EUA). This EUA will remain  in effect (meaning this test can be used) for the duration of the COVID-19 declaration under Section 56 4(b)(1) of the Act, 21 U.S.C. section 360bbb-3(b)(1), unless the authorization is terminated or revoked  sooner. Performed at [facility], [place] [address], [place], [place] [address]   MRSA PCR Screening     Status: Abnormal   Collection Time: 03/11/19 12:20 AM   Specimen: Nasopharyngeal  Result Value Ref Range Status   MRSA by PCR POSITIVE (A) NEGATIVE Final    Comment:        The GeneXpert MRSA Assay (FDA approved for NASAL specimens only), is one component of a comprehensive MRSA colonization surveillance program. It is not intended to diagnose MRSA infection nor to guide or monitor treatment for MRSA infections. RESULT CALLED TO, READ BACK BY AND VERIFIED WITH: VAUGHN,M @ [id number] ON [id number] BY POTEAT,S Performed at [facility], [place] [address], [place], [place] [address]      Studies: Ct Abdomen Pelvis W Contrast  Result Date: 03/13/2019 CLINICAL DATA:  Abdominal pain, constipation EXAM: CT ABDOMEN AND PELVIS WITH CONTRAST TECHNIQUE: Multidetector CT imaging of the abdomen and pelvis was performed using the standard protocol following bolus administration of intravenous contrast. CONTRAST:  126mL OMNIPAQUE IOHEXOL 300 MG/ML  SOLN COMPARISON:  10/07/2017 FINDINGS: Lower chest: Moderate left pleural effusion. Mild dependent atelectasis in the bilateral lower lobes. Hepatobiliary: Liver is within normal limits. Status post cholecystectomy. No intrahepatic or extrahepatic ductal dilatation. Pancreas: Within normal limits. Spleen: Within normal limits. Adrenals/Urinary Tract: Adrenal glands are within normal limits. Mild right renal cortical scarring. Calyceal diverticulum along the anterior interpolar right kidney. Left kidney is within normal limits. No hydronephrosis. Bladder is within normal limits. Stomach/Bowel: Stomach is notable for a tiny hiatal hernia. No evidence of bowel obstruction. Appendix is within normal limits (series 2/image 55). No colonic wall thickening or inflammatory changes. Vascular/Lymphatic: No evidence of abdominal aortic aneurysm.  Atherosclerotic calcifications of the abdominal aorta and branch vessels. No suspicious abdominopelvic lymphadenopathy. Reproductive: Uterus is unremarkable. No adnexal masses. Other: No abdominopelvic ascites. Tiny periumbilical hernia containing fat and a loop of nondilated small bowel (series 2/image 61). Musculoskeletal: Mild superior endplate compression fracture deformity involving T10 through T12. Moderate central compression fracture deformity at L2 with mild retropulsion. These findings are chronic. IMPRESSION: No evidence of bowel obstruction.  Normal appendix. Tiny periumbilical hernia containing fat and a loop of nondilated small bowel. No CT findings to account for the patient's chronic abdominal pain. Moderate left pleural effusion. Mild dependent atelectasis in the bilateral lower lobes. Electronically Signed   By: Julian Hy M.D.   On: 03/13/2019 15:06   Dg  Chest Port 1 View  Result Date: 03/13/2019 CLINICAL DATA:  Pleural effusion, shortness of breath EXAM: PORTABLE CHEST 1 VIEW COMPARISON:  March 09, 2019 FINDINGS: There is mild cardiomegaly. There is blunting of the left costophrenic angle, likely trace effusion. The right lung is clear. No acute osseous abnormality. IMPRESSION: Probable trace left pleural effusion. Electronically Signed   By: Prudencio Pair M.D.   On: 03/13/2019 17:22    Scheduled Meds:  asenapine  10 mg Sublingual BID   aspirin  81 mg Oral Daily   buPROPion  150 mg Oral Daily   Chlorhexidine Gluconate Cloth  6 each Topical Q0600   diazepam  5 mg Oral TID   docusate sodium  100 mg Oral BID   enoxaparin (LOVENOX) injection  40 mg Subcutaneous Q24H   feeding supplement (ENSURE ENLIVE)  237 mL Oral Q24H   feeding supplement (PRO-STAT SUGAR FREE 64)  30 mL Oral BID   fluticasone furoate-vilanterol  1 puff Inhalation Daily   furosemide  20 mg Oral BID   gabapentin  100 mg Oral QHS   hydrOXYzine  50 mg Oral TID   mouth rinse  15 mL Mouth  Rinse BID   Melatonin  5 mg Oral QHS   metoprolol tartrate  25 mg Oral BID   multivitamin with minerals  1 tablet Oral Daily   mupirocin ointment  1 application Nasal BID   NIFEdipine  90 mg Oral Daily   nitrofurantoin (macrocrystal-monohydrate)  100 mg Oral Q12H   pantoprazole  40 mg Oral Daily   polyethylene glycol  17 g Oral BID   QUEtiapine  50 mg Oral QHS   sodium chloride  1 g Oral TID WC   valproic acid  500 mg Oral BID   Continuous Infusions:  sodium chloride Stopped (03/11/19 2318)    Principal Problem:   Schizoaffective disorder, bipolar type (HCC) Active Problems:   DM (diabetes mellitus), type 2 (HCC)   Schizoaffective disorder (HCC)   Essential hypertension   Acute hyponatremia   Chronic respiratory failure with hypoxia (HCC)   Anxiety and depression   Agitation   Urinary tract bacterial infections   Type 2 diabetes mellitus without complication (HCC)   Pleural effusion   Atelectasis   Chronic diastolic CHF (congestive heart failure) ([place])      Myrlene Broker D Jermaine Neuharth  [facility]

## 2019-03-14 NOTE — NC FL2 (Signed)
[place] MEDICAID FL2 LEVEL OF CARE SCREENING TOOL     IDENTIFICATION  Patient Name: Natalie Thomas Birthdate: Apr 23, 1961 Sex: female Admission Date (Current Location): 03/09/2019  [facility] and [place] Number:  Herbalist and Address:  [facility],  [place] [address] [place]      Provider Number: [id number]  Attending Physician Name and Address:  Desiree Hane, MD  Relative Name and Phone Number:       Current Level of Care: Hospital Recommended Level of Care: [place] Prior Approval Number:    Date Approved/Denied:   PASRR Number:    Discharge Plan: Other (Comment)(ALF)    Current Diagnoses: Patient Active Problem List   Diagnosis Date Noted  . Pleural effusion 03/13/2019  . Atelectasis 03/13/2019  . Chronic diastolic CHF (congestive heart failure) ([place]) 03/13/2019  . Type 2 diabetes mellitus without complication ([place]) [id number]  . Urinary tract bacterial infections 03/09/2019  . Acute cystitis without hematuria   . Constipation   . Acute hypoxemic respiratory failure ([place]) 12/03/2018  . Irritability 11/09/2018  . Aggressive behavior 11/09/2018  . Agitation   . S/P right hip fracture 11/07/2017  . Closed comminuted intertrochanteric fracture of right femur ([place]) 11/07/2017  . Anxiety and depression   . Dyspnea 10/16/2017  . Anemia 10/16/2017  . COPD exacerbation ([place]) 10/07/2017  . Hyperlipidemia 10/07/2017  . Chronic respiratory failure with hypoxia ([place]) 10/07/2017  . Acute on chronic respiratory failure with hypoxia and hypercapnia ([place]) 10/07/2017  . Incontinence of urine 08/14/2011  . Acute hyponatremia 07/27/2011  . Schizoaffective disorder, bipolar type ([place]) 07/05/2011  . Cough 05/08/2011  . Encounter for long-term (current) use of other medications 04/09/2011  . Lumbar disc disease 04/09/2011  . Polycythemia secondary to smoking 04/09/2011  . HIP PAIN, LEFT 12/12/2007  . NEOPLASM, MALIGNANT,  VULVA 04/08/2007  . DM (diabetes mellitus), type 2 ([place]) 04/08/2007  . MENOPAUSE, PREMATURE 04/08/2007  . Chronic hyponatremia 04/08/2007  . Schizoaffective disorder ([place]) 04/08/2007  . DEPRESSION 04/08/2007  . Essential hypertension 04/08/2007  . COPD 04/08/2007  . GERD 04/08/2007  . Osteoporosis 04/08/2007  . DYSURIA 04/08/2007    Orientation RESPIRATION BLADDER Height & Weight     Self, Time, Situation, Place  Supplemental oxygen, 2 L Continent, Incontinent Weight: 197 lb 1.5 oz (89.4 kg) Height:     BEHAVIORAL SYMPTOMS/MOOD NEUROLOGICAL BOWEL NUTRITION STATUS      Continent, Incontinent Diet- no added salt  AMBULATORY STATUS COMMUNICATION OF NEEDS Skin   Limited Assist Verbally Normal                       Personal Care Assistance Level of Assistance  Bathing, Dressing Bathing Assistance: Limited assistance   Dressing Assistance: Limited assistance     Functional Limitations Info  Sight          SPECIAL CARE FACTORS FREQUENCY   PT 2x by licensed physical therapist    Contractures Contractures Info: Not present    Additional Factors Info  Code Status, Allergies Code Status Info: Full Allergies Info: Clarithomycin, Penicillins           Current Medications (03/14/2019):  This is the current hospital active medication list Current Facility-Administered Medications  Medication Dose Route Frequency Provider Last Rate Last Dose  . 0.9 %  sodium chloride infusion   Intravenous PRN Ileene Musa T, DO   Stopped at 03/11/19 2318  . acetaminophen (TYLENOL) tablet 650 mg  650 mg  Oral Q6H PRN Ileene Musa T, DO   650 mg at 03/14/19 0858  . albuterol (PROVENTIL) (2.5 MG/3ML) 0.083% nebulizer solution 2.5 mg  2.5 mg Nebulization Q4H PRN Tu, Ching T, DO   2.5 mg at 03/14/19 1102  . alum & mag hydroxide-simeth (MAALOX/MYLANTA) 200-200-20 MG/5ML suspension 30 mL  30 mL Oral QID PRN Tu, Ching T, DO   30 mL at 03/13/19 0815  . asenapine (SAPHRIS) sublingual tablet 10 mg  10  mg Sublingual BID Raiford Noble [place], DO   10 mg at 03/14/19 0929  . aspirin chewable tablet 81 mg  81 mg Oral Daily Tu, Ching T, DO   81 mg at 03/14/19 [id number]  . benzonatate (TESSALON) capsule 100 mg  100 mg Oral TID PRN Tu, Ching T, DO      . buPROPion (WELLBUTRIN XL) 24 hr tablet 150 mg  150 mg Oral Daily Tu, Ching T, DO   150 mg at 03/14/19 0928  . calcium carbonate (TUMS - dosed in mg elemental calcium) chewable tablet 200 mg of elemental calcium  1 tablet Oral BID PRN Tu, Ching T, DO      . Chlorhexidine Gluconate Cloth 2 % PADS 6 each  6 each Topical Q0600 Raiford Noble [place], [place]   6 each at 03/13/19 0502  . diazepam (VALIUM) tablet 5 mg  5 mg Oral TID Tu, Ching T, DO   5 mg at 03/14/19 0928  . docusate sodium (COLACE) capsule 100 mg  100 mg Oral BID Tu, Ching T, DO   100 mg at 03/14/19 [id number]  . enoxaparin (LOVENOX) injection 40 mg  40 mg Subcutaneous Q24H Tu, Ching T, DO   40 mg at 03/13/19 2232  . feeding supplement (ENSURE ENLIVE) (ENSURE ENLIVE) liquid 237 mL  237 mL Oral Q24H Raiford Noble [place], [place]   237 mL at 03/12/19 2119  . feeding supplement (PRO-STAT SUGAR FREE 64) liquid 30 mL  30 mL Oral BID Raiford Noble [place], DO   30 mL at 03/14/19 [id number]  . fluticasone furoate-vilanterol (BREO ELLIPTA) 100-25 MCG/INH 1 puff  1 puff Inhalation Daily Tu, Ching T, DO   1 puff at 03/14/19 1103  . furosemide (LASIX) tablet 20 mg  20 mg Oral BID Minda Ditto, RPH   20 mg at 03/14/19 0855  . gabapentin (NEURONTIN) capsule 100 mg  100 mg Oral QHS Tu, Ching T, DO   100 mg at 03/13/19 2225  . hydrOXYzine (ATARAX/VISTARIL) tablet 50 mg  50 mg Oral TID Tu, Ching T, DO   50 mg at 03/14/19 0928  . MEDLINE mouth rinse  15 mL Mouth Rinse BID Tu, Ching T, DO   15 mL at 03/13/19 2228  . Melatonin TABS 5 mg  5 mg Oral QHS Tu, Ching T, DO   5 mg at 03/13/19 2225  . metoprolol tartrate (LOPRESSOR) tablet 25 mg  25 mg Oral BID Tu, Ching T, DO   25 mg at 03/14/19 [id number]  . multivitamin with minerals tablet 1 tablet  1  tablet Oral Daily Raiford Noble [place], [place]   1 tablet at 03/14/19 [id number]  . mupirocin ointment (BACTROBAN) 2 % 1 application  1 application Nasal BID Raiford Noble [place], [place]   1 application at [id number]  . NIFEdipine (PROCARDIA-XL/NIFEDICAL-XL) 24 hr tablet 90 mg  90 mg Oral Daily Tu, Ching T, DO   90 mg at 03/14/19 0928  . nitrofurantoin (macrocrystal-monohydrate) (MACROBID) capsule 100 mg  100 mg Oral Q12H Nettey,  Waldron Session, MD   100 mg at 03/14/19 0928  . oxyCODONE (Oxy IR/ROXICODONE) immediate release tablet 5 mg  5 mg Oral Q12H PRN Tu, Ching T, DO   5 mg at 03/14/19 1036  . pantoprazole (PROTONIX) EC tablet 40 mg  40 mg Oral Daily Tu, Ching T, DO   40 mg at 03/14/19 0928  . polyethylene glycol (MIRALAX / GLYCOLAX) packet 17 g  17 g Oral BID Tu, Ching T, DO   17 g at 03/14/19 0925  . QUEtiapine (SEROQUEL) tablet 50 mg  50 mg Oral QHS Tu, Ching T, DO   50 mg at 03/13/19 2225  . sodium chloride tablet 1 g  1 g Oral TID WC Tu, Ching T, DO   1 g at 03/14/19 0855  . traZODone (DESYREL) tablet 50 mg  50 mg Oral QHS PRN Schorr, Rhetta Mura, NP   50 mg at 03/13/19 2231  . valproic acid (DEPAKENE) 250 MG capsule 500 mg  500 mg Oral BID Tu, Ching T, DO   500 mg at 03/14/19 [id number]     Discharge Medications: TAKE these medications   acetaminophen 325 MG tablet Commonly known as: TYLENOL Take 650 mg by mouth 2 (two) times daily as needed (pain). Do not exceed 3 grams/24 hours of tylenol from all sources   acetaminophen 500 MG tablet Commonly known as: TYLENOL Take 500 mg by mouth every 6 (six) hours as needed for fever.   albuterol (2.5 MG/3ML) 0.083% nebulizer solution Commonly known as: PROVENTIL Take 3 mLs (2.5 mg total) by nebulization every 4 (four) hours as needed for wheezing or shortness of breath.   aluminum-magnesium hydroxide-simethicone [id number] MG/5ML Susp Commonly known as: MAALOX Take 30 mLs by mouth 4 (four) times daily as needed (heartburn, indigestion).   arformoterol 15  MCG/2ML Nebu Commonly known as: BROVANA Take 2 mLs (15 mcg total) by nebulization 2 (two) times daily.   aspirin 81 MG tablet Take 1 tablet (81 mg total) by mouth daily. For heart health   benzonatate 100 MG capsule Commonly known as: TESSALON Take 1 capsule (100 mg total) by mouth 3 (three) times daily as needed for cough.   Breo Ellipta 100-25 MCG/INH Aepb Generic drug: fluticasone furoate-vilanterol Inhale 1 puff into the lungs daily.   budesonide 0.5 MG/2ML nebulizer solution Commonly known as: PULMICORT Take 2 mLs (0.5 mg total) by nebulization 2 (two) times daily.   buPROPion 150 MG 24 hr tablet Commonly known as: WELLBUTRIN XL Take 150 mg by mouth daily.   calcium carbonate 500 MG chewable tablet Commonly known as: TUMS - dosed in mg elemental calcium Chew 1 tablet (200 mg of elemental calcium total) by mouth 2 (two) times daily as needed for indigestion or heartburn.   Calcium-Vitamin D-Minerals 600-800 MG-UNIT Chew Chew 1 tablet by mouth daily.   Dermacloud Crea Apply 1 application topically 3 (three) times daily.   diazepam 5 MG tablet Commonly known as: VALIUM Take 5 mg by mouth 3 (three) times daily.   diazepam 2 MG tablet Commonly known as: VALIUM Take 1 tablet (2 mg total) by mouth 2 (two) times a day.   docusate sodium 100 MG capsule Commonly known as: COLACE Take 100 mg by mouth 2 (two) times daily.   feeding supplement (ENSURE ENLIVE) Liqd Take 237 mLs by mouth daily.   feeding supplement (PRO-STAT SUGAR FREE 64) Liqd Take 30 mLs by mouth 2 (two) times daily.   fluticasone 50 MCG/ACT nasal spray Commonly known as: FLONASE  Place 2 sprays into both nostrils 2 (two) times daily.   furosemide 20 MG tablet Commonly known as: LASIX Take 20 mg by mouth 2 (two) times daily.   gabapentin 100 MG capsule Commonly known as: NEURONTIN Take 1 capsule (100 mg total) by mouth at bedtime.   hydrOXYzine 50 MG tablet Commonly known as:  ATARAX/VISTARIL Take 50 mg by mouth 3 (three) times daily.   hydrOXYzine 25 MG tablet Commonly known as: ATARAX/VISTARIL Take 1 tablet (25 mg total) by mouth 3 (three) times daily as needed for anxiety.   ipratropium-albuterol 0.5-2.5 (3) MG/3ML Soln Commonly known as: DUONEB Take 3 mLs by nebulization See admin instructions. Inhale one vial via hand held nebulizer three times daily, may also use every 8 hours as needed for shortness of breath   lurasidone 40 MG Tabs tablet Commonly known as: LATUDA Take 1 tablet (40 mg total) by mouth 2 (two) times a day.   Melatonin 5 MG Tabs Take 5 mg by mouth at bedtime.   methocarbamol 500 MG tablet Commonly known as: ROBAXIN Take 500 mg by mouth every 8 (eight) hours as needed for muscle spasms.   metoprolol tartrate 25 MG tablet Commonly known as: LOPRESSOR Take 1 tablet (25 mg total) by mouth 2 (two) times daily.   multivitamin with minerals Tabs tablet Take 1 tablet by mouth daily. Start taking on: March 15, 2019   nicotine 21 mg/24hr patch Commonly known as: NICODERM CQ - dosed in mg/24 hours Place 1 patch (21 mg total) onto the skin daily.   NIFEdipine 90 MG 24 hr tablet Commonly known as: PROCARDIA XL/NIFEDICAL-XL Take 90 mg by mouth daily.   nitrofurantoin (macrocrystal-monohydrate) 100 MG capsule Commonly known as: MACROBID Take 1 capsule (100 mg total) by mouth every 12 (twelve) hours for 2 days.   omeprazole 40 MG capsule Commonly known as: PRILOSEC Take 40 mg by mouth at bedtime.   omeprazole 20 MG capsule Commonly known as: PRILOSEC Take 1 capsule (20 mg total) by mouth 2 (two) times daily before a meal. For acid reflux.   oxyCODONE 5 MG immediate release tablet Commonly known as: Oxy IR/ROXICODONE Take 1 tablet (5 mg total) by mouth daily. What changed:   when to take this  reasons to take this   polyethylene glycol 17 g packet Commonly known as: MiraLax Take 17 g by mouth daily. What  changed: when to take this   QUEtiapine 50 MG tablet Commonly known as: SEROQUEL Take 1 tablet (50 mg total) by mouth at bedtime. What changed: when to take this   QUEtiapine 25 MG tablet Commonly known as: SEROQUEL Take 1 tablet (25 mg total) by mouth daily. What changed: Another medication with the same name was changed. Make sure you understand how and when to take each.   Robafen 100 MG/5ML syrup Generic drug: guaifenesin Take 200 mg by mouth 3 (three) times daily as needed for cough or congestion.   guaiFENesin 600 MG 12 hr tablet Commonly known as: MUCINEX Take 1,200 mg by mouth daily.   Saphris 5 MG Subl 24 hr tablet Generic drug: asenapine Place 2 tablets (10 mg total) under the tongue 2 (two) times daily. What changed: how much to take   sodium chloride 1 g tablet Take 1 tablet (1 g total) by mouth 3 (three) times daily with meals.   traZODone 50 MG tablet Commonly known as: DESYREL Take 1 tablet (50 mg total) by mouth at bedtime.   valproic acid 250 MG capsule  Commonly known as: DEPAKENE Take 2 capsules (500 mg total) by mouth 2 (two) times daily.   Relevant Imaging Results:  Relevant Lab Results:   Additional Information SSN: [id number]  Nila Nephew, LCSW

## 2019-03-15 DIAGNOSIS — R1084 Generalized abdominal pain: Secondary | ICD-10-CM

## 2019-03-15 DIAGNOSIS — R109 Unspecified abdominal pain: Secondary | ICD-10-CM | POA: Diagnosis present

## 2019-03-15 LAB — NOVEL CORONAVIRUS, NAA (HOSP ORDER, SEND-OUT TO REF LAB; TAT 18-24 HRS): SARS-CoV-2, NAA: NOT DETECTED

## 2019-03-15 MED ORDER — PANTOPRAZOLE SODIUM 40 MG PO TBEC
40.0000 mg | DELAYED_RELEASE_TABLET | Freq: Two times a day (BID) | ORAL | Status: DC
  Administered 2019-03-15 – 2019-03-16 (×2): 40 mg via ORAL
  Filled 2019-03-15 (×2): qty 1

## 2019-03-15 NOTE — Progress Notes (Signed)
[facility]  PROGRESS NOTE  Natalie Thomas [id number] DOB: 29-May-1961 DOA: 03/09/2019 PCP: System, Pcp Not In  Brief History    Natalie Thomas is a 58 y.o. year old female with medical history significant for COPD on 2 Loxygen, hypertension, type 2 diabetes,chronic hyponatremia,schizoaffective disorder, depression who presented on 03/09/2019 with increased agitation and labile mood per SNF.  And was found to have acute on chronic hyponatremia of 120, UTI, and hypertensive urgency with BP 200/76 on admission.  ED course: Sodium of 120.  Previous baseline of 130.,  Serum osmolality 257, urine oxmolality106, urine sodium 39, SARSUrine osmolality testing negative, high-sensitivity troponin 7. Ua with moderate leukocytes, positive nitrites, large leukocytes, many bacteria  A & P     Hypovolemic hyponatremia, acute on chronic, stable.  Nadir of 120, previous baseline range of 125-130, Currently 127. Patient is on several psychiatric medications that could be contributing as well but seems to be improving. Continue to monitor, will restrict some of her fluids as she has had polydipsia associated with her levels before   UTI secondary to E.coli.  Based on sensitivities on nitrofurantoin remains afebrile, symptoms controlled, normal white count.   Mood stabilization/paranoia related to schizoaffective disorder and depression.  Psychiatry evaluated on 9/15.  Recommended increasing Saphris 10 mg twice daily for mood stabilization, with close monitoring of QTC.  Continue previous home psych regimen.   Nonspecific abdominal pain.  Suspect related to constipation.  Abdominal x-ray shows no acute abnormalities, CT abdomen also shows no acute abnormalities shows chronic hernia, patient still having normal BMs will continue bowel regimen and monitor closely, nontoxic/nonacute abdominal exam increase Protonix to twice daily dosing   Incidental finding of moderate pleural effusion and  dependent atelectasis.  Found on CT abdomen imaging.  CXR showed trace effusion, stable on home O2 requirements.    CHF with preserved EF.  No obvious signs of acute exacerbation, no increased volume status, stable O2 requirements does have effusion but unclear association.  Closely monitor, daily weights, monitor output.  Continue oral Lasix 20 mg twice daily   HTN, improved currently at goal.  SBP's in the 130s..  Continue home Lopressor,   COPD on 2-3 L.  No active wheezing, continue scheduled inhaler therapy   Type 2 diabetes, diet controlled.  A1c of 6 back in June.  CBGs have been stable here.     DVT prophylaxis: Lovenox Code Status: Full code Family Communication: No family at bedside Disposition Plan: Medically stable for discharge, repeating COVID for return back to ALF--currently pending  [facility] Direct contact: see [email or web address] (further directions at bottom of note if needed) 7PM-7AM contact night coverage as at bottom of note 03/15/2019, 1:05 PM  LOS: 6 days   Consultants   Psychiatry  Procedures   None  Antibiotics   Ceftriaxone  Nitrofurantoin  Interval History/Subjective  Still having belly pain Was able to eat all of my potatoes, some of check roast/green beans.  Continues to want ice cream/no playing  Objective   Vitals:  Vitals:   03/14/19 2228 03/15/19 0842  BP: 132/84   Pulse: (!) 52   Resp: 16   Temp: 97.6 F (36.4 C)   SpO2: 97% 98%    Exam:  Awake Alert, Oriented X place, context, self, No new F.N deficits, normal mood [place].AT, Symmetrical Chest wall movement, Good air movement bilaterally, CTAB difficult to assess given body habitus, still on 3 L 02 [place] RRR,No Gallops,Rubs or new Murmurs,  +ve B.Sounds, Abd  Soft, some tenderness with deep palpation all over,  No rebound - guarding or rigidity.     I have personally reviewed the following:   Data Reviewed: Basic Metabolic Panel: Recent Labs  Lab 03/10/19 0850  03/11/19 0550 03/11/19 1327 03/12/19 0514 03/12/19 1309 03/13/19 0823 03/14/19 0436  NA 128* 126* 129* 126* 127* 128* 127*  K 5.1 4.8 4.7 4.8 5.3* 4.8 4.6  CL 88* 90* 88* 86* 87* 87* 88*  CO2 29 26 32 29 32 30 30  GLUCOSE 113* 88 148* 83 124* 95 101*  BUN 6 15 14 17 18 20 20   CREATININE 0.53 0.51 0.68 0.57 0.74 0.64 0.55  CALCIUM 9.1 9.0 9.1 8.9 9.3 9.1 9.0  MG 2.0 1.9  --   --   --   --   --   PHOS 5.3* 5.9*  --   --   --   --   --    Liver Function Tests: Recent Labs  Lab 03/09/19 1654 03/10/19 0850 03/11/19 0550 03/13/19 0823  AST 11* 8* 8* 12*  ALT 7 7 6 8   ALKPHOS 80 82 70 72  BILITOT 0.4 0.3 0.4 0.5  PROT 7.5 7.2 6.6 6.7  ALBUMIN 3.5 3.5 3.0* 3.3*   Recent Labs  Lab 03/09/19 1654  LIPASE 31   No results for input(s): AMMONIA in the last 168 hours. CBC: Recent Labs  Lab 03/09/19 1654 03/10/19 0254 03/11/19 0550  WBC 9.7 5.4 5.4  NEUTROABS  --   --  3.2  HGB 14.2 13.2 14.2  HCT 43.0 41.3 45.8  MCV 89.2 90.2 92.3  PLT 230 191 211   Cardiac Enzymes: No results for input(s): CKTOTAL, CKMB, CKMBINDEX, TROPONINI in the last 168 hours. BNP (last 3 results) Recent Labs    06/17/18 2133 12/03/18 0957 12/06/18 0911  BNP 125.4* 296.6* 132.3*    ProBNP (last 3 results) No results for input(s): PROBNP in the last 8760 hours.  CBG: Recent Labs  Lab 03/09/19 1645  GLUCAP 100*    Recent Results (from the past 240 hour(s))  Urine Culture     Status: Abnormal   Collection Time: 03/09/19  6:31 PM   Specimen: Urine, Random  Result Value Ref Range Status   Specimen Description   Final    URINE, RANDOM Performed at [facility] [address] [place], [place] [address]    Special Requests   Final    NONE Performed at [facility], [place] [address], [place], [place] [address]    Culture >=100,000 COLONIES/mL ESCHERICHIA COLI (A)  Final   Report Status 03/12/2019 FINAL  Final   Organism ID, Bacteria ESCHERICHIA  COLI (A)  Final      Susceptibility   Escherichia coli - MIC*    AMPICILLIN >=32 RESISTANT Resistant     CEFAZOLIN <=4 SENSITIVE Sensitive     CEFTRIAXONE <=1 SENSITIVE Sensitive     CIPROFLOXACIN >=4 RESISTANT Resistant     GENTAMICIN >=16 RESISTANT Resistant     IMIPENEM <=0.25 SENSITIVE Sensitive     NITROFURANTOIN <=16 SENSITIVE Sensitive     TRIMETH/SULFA <=20 SENSITIVE Sensitive     AMPICILLIN/SULBACTAM 16 INTERMEDIATE Intermediate     PIP/TAZO <=4 SENSITIVE Sensitive     Extended ESBL NEGATIVE Sensitive     * >=100,000 COLONIES/mL ESCHERICHIA COLI  SARS CORONAVIRUS 2 (TAT 6-24 HRS) Nasopharyngeal Nasopharyngeal Swab     Status: None   Collection Time: 03/09/19  6:47 PM   Specimen:  Nasopharyngeal Swab  Result Value Ref Range Status   SARS Coronavirus 2 NEGATIVE NEGATIVE Final    Comment: (NOTE) SARS-CoV-2 target nucleic acids are NOT DETECTED. The SARS-CoV-2 RNA is generally detectable in upper and lower respiratory specimens during the acute phase of infection. Negative results do not preclude SARS-CoV-2 infection, do not rule out co-infections with other pathogens, and should not be used as the sole basis for treatment or other patient management decisions. Negative results must be combined with clinical observations, patient history, and epidemiological information. The expected result is Negative. Fact Sheet for Patients: SugarRoll.be Fact Sheet for Healthcare Providers: https://www.woods-mathews.com/ This test is not yet approved or cleared by the [place] FDA and  has been authorized for detection and/or diagnosis of SARS-CoV-2 by FDA under an Emergency Use Authorization (EUA). This EUA will remain  in effect (meaning this test can be used) for the duration of the COVID-19 declaration under Section 56 4(b)(1) of the Act, 21 U.S.C. section 360bbb-3(b)(1), unless the authorization is terminated or revoked  sooner. Performed at [facility], [place] [address], [place], [place] [address]   MRSA PCR Screening     Status: Abnormal   Collection Time: 03/11/19 12:20 AM   Specimen: Nasopharyngeal  Result Value Ref Range Status   MRSA by PCR POSITIVE (A) NEGATIVE Final    Comment:        The GeneXpert MRSA Assay (FDA approved for NASAL specimens only), is one component of a comprehensive MRSA colonization surveillance program. It is not intended to diagnose MRSA infection nor to guide or monitor treatment for MRSA infections. RESULT CALLED TO, READ BACK BY AND VERIFIED WITH: VAUGHN,M @ [id number] ON [id number] BY POTEAT,S Performed at [facility], [place] [address] [place], [place] [address]      Studies: Ct Abdomen Pelvis W Contrast  Result Date: 03/13/2019 CLINICAL DATA:  Abdominal pain, constipation EXAM: CT ABDOMEN AND PELVIS WITH CONTRAST TECHNIQUE: Multidetector CT imaging of the abdomen and pelvis was performed using the standard protocol following bolus administration of intravenous contrast. CONTRAST:  124mL OMNIPAQUE IOHEXOL 300 MG/ML  SOLN COMPARISON:  10/07/2017 FINDINGS: Lower chest: Moderate left pleural effusion. Mild dependent atelectasis in the bilateral lower lobes. Hepatobiliary: Liver is within normal limits. Status post cholecystectomy. No intrahepatic or extrahepatic ductal dilatation. Pancreas: Within normal limits. Spleen: Within normal limits. Adrenals/Urinary Tract: Adrenal glands are within normal limits. Mild right renal cortical scarring. Calyceal diverticulum along the anterior interpolar right kidney. Left kidney is within normal limits. No hydronephrosis. Bladder is within normal limits. Stomach/Bowel: Stomach is notable for a tiny hiatal hernia. No evidence of bowel obstruction. Appendix is within normal limits (series 2/image 55). No colonic wall thickening or inflammatory changes. Vascular/Lymphatic: No evidence of abdominal aortic aneurysm.  Atherosclerotic calcifications of the abdominal aorta and branch vessels. No suspicious abdominopelvic lymphadenopathy. Reproductive: Uterus is unremarkable. No adnexal masses. Other: No abdominopelvic ascites. Tiny periumbilical hernia containing fat and a loop of nondilated small bowel (series 2/image 61). Musculoskeletal: Mild superior endplate compression fracture deformity involving T10 through T12. Moderate central compression fracture deformity at L2 with mild retropulsion. These findings are chronic. IMPRESSION: No evidence of bowel obstruction.  Normal appendix. Tiny periumbilical hernia containing fat and a loop of nondilated small bowel. No CT findings to account for the patient's chronic abdominal pain. Moderate left pleural effusion. Mild dependent atelectasis in the bilateral lower lobes. Electronically Signed   By: Julian Hy M.D.   On: 03/13/2019 15:06   Dg  Chest Port 1 View  Result Date: 03/13/2019 CLINICAL DATA:  Pleural effusion, shortness of breath EXAM: PORTABLE CHEST 1 VIEW COMPARISON:  March 09, 2019 FINDINGS: There is mild cardiomegaly. There is blunting of the left costophrenic angle, likely trace effusion. The right lung is clear. No acute osseous abnormality. IMPRESSION: Probable trace left pleural effusion. Electronically Signed   By: Prudencio Pair M.D.   On: 03/13/2019 17:22    Scheduled Meds:  asenapine  10 mg Sublingual BID   aspirin  81 mg Oral Daily   buPROPion  150 mg Oral Daily   Chlorhexidine Gluconate Cloth  6 each Topical Q0600   diazepam  5 mg Oral TID   docusate sodium  100 mg Oral BID   enoxaparin (LOVENOX) injection  40 mg Subcutaneous Q24H   feeding supplement (ENSURE ENLIVE)  237 mL Oral Q24H   feeding supplement (PRO-STAT SUGAR FREE 64)  30 mL Oral BID   fluticasone furoate-vilanterol  1 puff Inhalation Daily   furosemide  20 mg Oral BID   gabapentin  100 mg Oral QHS   hydrOXYzine  50 mg Oral TID   mouth rinse  15 mL Mouth  Rinse BID   Melatonin  5 mg Oral QHS   metoprolol tartrate  25 mg Oral BID   multivitamin with minerals  1 tablet Oral Daily   mupirocin ointment  1 application Nasal BID   NIFEdipine  90 mg Oral Daily   nitrofurantoin (macrocrystal-monohydrate)  100 mg Oral Q12H   pantoprazole  40 mg Oral BID   polyethylene glycol  17 g Oral BID   QUEtiapine  50 mg Oral QHS   sodium chloride  1 g Oral TID WC   valproic acid  500 mg Oral BID   Continuous Infusions:  sodium chloride Stopped (03/11/19 2318)    Principal Problem:   Schizoaffective disorder, bipolar type (HCC) Active Problems:   DM (diabetes mellitus), type 2 (HCC)   Schizoaffective disorder (HCC)   Essential hypertension   Acute hyponatremia   Chronic respiratory failure with hypoxia (HCC)   Anxiety and depression   Agitation   Urinary tract bacterial infections   Type 2 diabetes mellitus without complication (HCC)   Pleural effusion   Atelectasis   Chronic diastolic CHF (congestive heart failure) ([place])      Myrlene Broker D Makira Holleman  [facility]

## 2019-03-16 LAB — CREATININE, SERUM
Creatinine, Ser: 0.65 mg/dL (ref 0.44–1.00)
GFR calc Af Amer: >60 mL/min (ref >60)
GFR calc non Af Amer: >60 mL/min (ref >60)

## 2019-03-16 MED ORDER — OMEPRAZOLE 40 MG PO CPDR: 40.0000 mg | DELAYED_RELEASE_CAPSULE | Freq: Two times a day (BID) | ORAL | Status: DC

## 2019-03-16 NOTE — Progress Notes (Signed)
[facility] notified of patient return to facility.

## 2019-03-16 NOTE — TOC Transition Note (Signed)
Transition of Care [facility]) - CM/SW Discharge Note   Patient Details  Name: Natalie Thomas MRN: [id number] Date of Birth: Aug 09, 1960  [facility] of Care [facility] CM/SW Contact:  Lynnell Catalan, RN Phone Number: [phone] 03/16/2019, 1:01 PM   Clinical Narrative:     Pt to dc back to [facility] today. Pt was already on home 02 prior to admission. [facility] contacted for transport.    Readmission Risk Interventions Readmission Risk Prevention Plan 03/10/2019 12/08/2018  Transportation Screening Complete Complete  Medication Review Press photographer) Complete Complete  PCP or Specialist appointment within 3-5 days of discharge Complete Complete  HRI or Home Care Consult Complete Complete  SW Recovery Care/Counseling Consult Complete Complete  Palliative Care Screening Not Applicable Not Applicable  Skilled Nursing Facility Complete Complete  Some recent data might be hidden

## 2019-03-16 NOTE — Progress Notes (Signed)
Pt discharged to [facility] via [place] in stable condition. No immediate questions or concerns at this time.

## 2019-03-16 NOTE — Care Management Important Message (Signed)
Important Message  Patient Details IM Letter given to Marney Doctor RN to present to the Patient Name: Natalie Thomas MRN: [id number] Date of Birth: Jul 31, 1960   Medicare Important Message Given:  Yes     Kerin Salen 03/16/2019, 10:50 AM

## 2019-04-21 ENCOUNTER — Emergency Department (HOSPITAL_COMMUNITY): Payer: Medicare Other

## 2019-04-21 ENCOUNTER — Inpatient Hospital Stay (HOSPITAL_COMMUNITY)
Admission: EM | Admit: 2019-04-21 | Discharge: 2019-04-24 | DRG: 641 | Disposition: A | Discharge status: Home / Self Care | Payer: Medicare Other | Attending: Internal Medicine | Admitting: Internal Medicine

## 2019-04-21 DIAGNOSIS — F17210 Nicotine dependence, cigarettes, uncomplicated: Secondary | ICD-10-CM | POA: Diagnosis present

## 2019-04-21 DIAGNOSIS — K219 Gastro-esophageal reflux disease without esophagitis: Secondary | ICD-10-CM | POA: Diagnosis present

## 2019-04-21 DIAGNOSIS — Z20828 Contact with and (suspected) exposure to other viral communicable diseases: Secondary | ICD-10-CM | POA: Diagnosis present

## 2019-04-21 DIAGNOSIS — I160 Hypertensive urgency: Secondary | ICD-10-CM | POA: Diagnosis present

## 2019-04-21 DIAGNOSIS — Z880 Allergy status to penicillin: Secondary | ICD-10-CM

## 2019-04-21 DIAGNOSIS — J449 Chronic obstructive pulmonary disease, unspecified: Secondary | ICD-10-CM | POA: Diagnosis present

## 2019-04-21 DIAGNOSIS — I110 Hypertensive heart disease with heart failure: Secondary | ICD-10-CM | POA: Diagnosis present

## 2019-04-21 DIAGNOSIS — R109 Unspecified abdominal pain: Secondary | ICD-10-CM | POA: Diagnosis present

## 2019-04-21 DIAGNOSIS — Z7982 Long term (current) use of aspirin: Secondary | ICD-10-CM

## 2019-04-21 DIAGNOSIS — Z881 Allergy status to other antibiotic agents status: Secondary | ICD-10-CM

## 2019-04-21 DIAGNOSIS — R112 Nausea with vomiting, unspecified: Secondary | ICD-10-CM

## 2019-04-21 DIAGNOSIS — Z79890 Hormone replacement therapy (postmenopausal): Secondary | ICD-10-CM | POA: Diagnosis not present

## 2019-04-21 DIAGNOSIS — Z79899 Other long term (current) drug therapy: Secondary | ICD-10-CM

## 2019-04-21 DIAGNOSIS — E119 Type 2 diabetes mellitus without complications: Secondary | ICD-10-CM | POA: Diagnosis present

## 2019-04-21 DIAGNOSIS — E785 Hyperlipidemia, unspecified: Secondary | ICD-10-CM | POA: Diagnosis present

## 2019-04-21 DIAGNOSIS — M810 Age-related osteoporosis without current pathological fracture: Secondary | ICD-10-CM | POA: Diagnosis present

## 2019-04-21 DIAGNOSIS — K429 Umbilical hernia without obstruction or gangrene: Secondary | ICD-10-CM | POA: Diagnosis present

## 2019-04-21 DIAGNOSIS — I10 Essential (primary) hypertension: Secondary | ICD-10-CM

## 2019-04-21 DIAGNOSIS — E871 Hypo-osmolality and hyponatremia: Principal | ICD-10-CM

## 2019-04-21 DIAGNOSIS — I5032 Chronic diastolic (congestive) heart failure: Secondary | ICD-10-CM | POA: Diagnosis present

## 2019-04-21 DIAGNOSIS — F250 Schizoaffective disorder, bipolar type: Secondary | ICD-10-CM | POA: Diagnosis present

## 2019-04-21 DIAGNOSIS — F259 Schizoaffective disorder, unspecified: Secondary | ICD-10-CM | POA: Diagnosis present

## 2019-04-21 DIAGNOSIS — J9611 Chronic respiratory failure with hypoxia: Secondary | ICD-10-CM | POA: Diagnosis present

## 2019-04-21 DIAGNOSIS — I5043 Acute on chronic combined systolic (congestive) and diastolic (congestive) heart failure: Secondary | ICD-10-CM | POA: Diagnosis present

## 2019-04-21 LAB — LIPASE, BLOOD: Lipase: 32 U/L (ref 11–51)

## 2019-04-21 LAB — CBC
HCT: 44.2 % (ref 36.0–46.0)
Hemoglobin: 15.3 g/dL — ABNORMAL HIGH (ref 12.0–15.0)
MCH: 28.6 pg (ref 26.0–34.0)
MCHC: 34.6 g/dL (ref 30.0–36.0)
MCV: 82.6 fL (ref 80.0–100.0)
Platelets: 204 10*3/uL (ref 150–400)
RBC: 5.35 MIL/uL — ABNORMAL HIGH (ref 3.87–5.11)
RDW: 13 % (ref 11.5–15.5)
WBC: 9.9 10*3/uL (ref 4.0–10.5)
nRBC: 0 % (ref 0.0–0.2)

## 2019-04-21 LAB — URINALYSIS, ROUTINE W REFLEX MICROSCOPIC
Bilirubin Urine: NEGATIVE
Glucose, UA: NEGATIVE mg/dL
Ketones, ur: NEGATIVE mg/dL
Leukocytes,Ua: NEGATIVE
Nitrite: NEGATIVE
Protein, ur: NEGATIVE mg/dL
Specific Gravity, Urine: 1.003 — ABNORMAL LOW (ref 1.005–1.030)
pH: 8 (ref 5.0–8.0)

## 2019-04-21 LAB — COMPREHENSIVE METABOLIC PANEL
ALT: 12 U/L (ref 0–44)
AST: 17 U/L (ref 15–41)
Albumin: 3.7 g/dL (ref 3.5–5.0)
Alkaline Phosphatase: 100 U/L (ref 38–126)
Anion gap: 11 (ref 5–15)
BUN: 14 mg/dL (ref 6–20)
CO2: 24 mmol/L (ref 22–32)
Calcium: 9 mg/dL (ref 8.9–10.3)
Chloride: 86 mmol/L — ABNORMAL LOW (ref 98–111)
Creatinine, Ser: 0.51 mg/dL (ref 0.44–1.00)
GFR calc Af Amer: >60 mL/min (ref >60)
GFR calc non Af Amer: >60 mL/min (ref >60)
Glucose, Bld: 157 mg/dL — ABNORMAL HIGH (ref 70–99)
Potassium: 4 mmol/L (ref 3.5–5.1)
Sodium: 121 mmol/L — ABNORMAL LOW (ref 135–145)
Total Bilirubin: 0.5 mg/dL (ref 0.3–1.2)
Total Protein: 8 g/dL (ref 6.5–8.1)

## 2019-04-21 MED ORDER — SODIUM CHLORIDE (PF) 0.9 % IJ SOLN
INTRAMUSCULAR | Status: AC
  Filled 2019-04-21: qty 50

## 2019-04-21 MED ORDER — SODIUM CHLORIDE 0.9 % IV BOLUS (SEPSIS)
1000.0000 mL | Freq: Once | INTRAVENOUS | Status: AC
  Administered 2019-04-21: 1000 mL via INTRAVENOUS

## 2019-04-21 MED ORDER — IOHEXOL 300 MG/ML  SOLN
100.0000 mL | Freq: Once | INTRAMUSCULAR | Status: AC | PRN
  Administered 2019-04-21: 100 mL via INTRAVENOUS

## 2019-04-21 MED ORDER — MORPHINE SULFATE (PF) 4 MG/ML IV SOLN
4.0000 mg | Freq: Once | INTRAVENOUS | Status: AC
  Administered 2019-04-21: 4 mg via INTRAVENOUS
  Filled 2019-04-21: qty 1

## 2019-04-21 MED ORDER — ONDANSETRON HCL 4 MG/2ML IJ SOLN
4.0000 mg | Freq: Once | INTRAMUSCULAR | Status: AC
  Administered 2019-04-21: 4 mg via INTRAVENOUS
  Filled 2019-04-21: qty 2

## 2019-04-21 MED ORDER — SODIUM CHLORIDE 0.9 % IV SOLN
1000.0000 mL | INTRAVENOUS | Status: DC
  Administered 2019-04-22 – 2019-04-24 (×6): 1000 mL via INTRAVENOUS

## 2019-04-21 MED ORDER — LABETALOL HCL 5 MG/ML IV SOLN
20.0000 mg | Freq: Once | INTRAVENOUS | Status: AC
  Administered 2019-04-21: 20 mg via INTRAVENOUS
  Filled 2019-04-21: qty 4

## 2019-04-21 NOTE — ED Provider Notes (Signed)
[facility] DEPT Provider Note   CSN: [id number] Arrival date & time: 04/21/19  1724     History   Chief Complaint Nausea and vomiting  HPI Natalie Thomas is a 58 y.o. female.     HPI  Patient presents to the ED for evaluation of nausea vomiting abdominal pain.  Patient has history of diabetes, schizoaffective disorder, chronic respiratory failure with hypoxia and hyponatremia.  Patient states she started having trouble with nausea and vomiting today.  She also complains of abdominal pain.  She does not think she has had a bowel movement today.  Patient states she needs her medications.  She is asking for Valium and something for pain.  No diarrhea.  No diarrhea.  No fevers or chills.  Patient is a resident of a nursing facility.  According to the EMS reports patient started having nausea vomiting and has also been confused since this morning.  Staff states that patient was recently switched from Valium to Xanax. Past Medical History:  Diagnosis Date  . Acute on chronic respiratory failure with hypoxia and hypercapnia ([place]) 10/07/2017  . Anemia 10/16/2017  . Anxiety and depression   . CHF (congestive heart failure) ([place])   . Chronic hyponatremia 04/08/2007   Qualifier: Diagnosis of  By: Marca Ancona RMA, Lucy    . Chronic respiratory failure with hypoxia ([place]) 10/07/2017  . Closed comminuted intertrochanteric fracture of right femur ([place]) 11/07/2017  . COPD 04/08/2007   Qualifier: Diagnosis of  By: Marca Ancona RMA, Lucy    . Cough 05/08/2011  . Depression   . Diabetes mellitus, type 2 ([place])    pt denies but states that she has been treated for DM  . Dyspnea 10/16/2017  . Dysuria 04/08/2007   Qualifier: Diagnosis of  By: Reatha Armour, Lucy    . Essential hypertension 04/08/2007   Qualifier: Diagnosis of  By: Marca Ancona RMA, Lucy    . GERD (gastroesophageal reflux disease)   . HIP PAIN, LEFT 12/12/2007   Qualifier: Diagnosis of  By: Loanne Drilling MD, Jacelyn Pi   .  Hyperlipidemia 10/07/2017  . Hypertension   . Hyponatremia 07/27/2011  . Incontinence of urine 08/14/2011  . Lumbar disc disease 04/09/2011  . MENOPAUSE, PREMATURE 04/08/2007   Qualifier: Diagnosis of  By: Marca Ancona RMA, Lucy    . 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 Osteoporosis   . Osteoporosis 04/08/2007   Qualifier: Diagnosis of  By: Reatha Armour, Lucy    . Polycythemia secondary to smoking 04/09/2011  . S/P right hip fracture 11/07/2017  . Schizoaffective disorder, bipolar type ([place]) 07/05/2011    Patient Active Problem List   Diagnosis Date Noted  . Abdominal pain 03/15/2019  . Pleural effusion 03/13/2019  . Atelectasis 03/13/2019  . Chronic diastolic CHF (congestive heart failure) ([place]) 03/13/2019  . Type 2 diabetes mellitus without complication ([place]) [id number]  . Urinary tract bacterial infections 03/09/2019  . Acute cystitis without hematuria   . Constipation   . Acute hypoxemic respiratory failure ([place]) 12/03/2018  . Irritability 11/09/2018  . Aggressive behavior 11/09/2018  . Agitation   . S/P right hip fracture 11/07/2017  . Closed comminuted intertrochanteric fracture of right femur ([place]) 11/07/2017  . Anxiety and depression   . Dyspnea 10/16/2017  . Anemia 10/16/2017  . COPD exacerbation ([place]) 10/07/2017  . Hyperlipidemia 10/07/2017  . Chronic respiratory failure with hypoxia ([place]) 10/07/2017  . Acute on chronic respiratory failure with hypoxia  and hypercapnia ([place]) 10/07/2017  . Incontinence of urine 08/14/2011  . Acute hyponatremia 07/27/2011  . Schizoaffective disorder, bipolar type ([place]) 07/05/2011  . Cough 05/08/2011  . Encounter for long-term (current) use of other medications 04/09/2011  . Lumbar disc disease 04/09/2011  . Polycythemia secondary to smoking 04/09/2011  . HIP PAIN, LEFT 12/12/2007  . 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 DM (diabetes mellitus), type 2 ([place]) 04/08/2007  . MENOPAUSE,  PREMATURE 04/08/2007  . Chronic hyponatremia 04/08/2007  . Schizoaffective disorder ([place]) 04/08/2007  . DEPRESSION 04/08/2007  . Essential hypertension 04/08/2007  . COPD 04/08/2007  . GERD 04/08/2007  . Osteoporosis 04/08/2007  . DYSURIA 04/08/2007    Past Surgical History:  Procedure Laterality Date  . EYE SURGERY     on left  eye, pt states that she sees bad out of the right eye and needs surgery there  . INTRAMEDULLARY (IM) NAIL INTERTROCHANTERIC Right 11/13/2017   Procedure: INTRAMEDULLARY (IM) NAIL INTERTROCHANTRIC;  Surgeon: Shona Needles, MD;  Location: [facility];  Service: Orthopedics;  Laterality: Right;  . PELVIC FRACTURE SURGERY       OB History   No obstetric history on file.      Home Medications    Prior to Admission medications   Medication Sig Start Date End Date Taking? Authorizing Provider  acetaminophen (TYLENOL) 325 MG tablet Take 650 mg by mouth 2 (two) times daily as needed (pain). Do not exceed 3 grams/24 hours of tylenol from all sources   Yes [provider]  acetaminophen (TYLENOL) 500 MG tablet Take 500 mg by mouth every 6 (six) hours as needed for fever.   Yes [provider]  albuterol (PROVENTIL) (2.5 MG/3ML) 0.083% nebulizer solution Take 3 mLs (2.5 mg total) by nebulization every 4 (four) hours as needed for wheezing or shortness of breath. 12/11/18  Yes Swayze, Ava, DO  ALPRAZolam (XANAX) 0.5 MG tablet Take 0.5 mg by mouth 2 (two) times daily.   Yes [provider]  aluminum-magnesium hydroxide-simethicone (MAALOX) 200-200-20 MG/5ML SUSP Take 30 mLs by mouth 4 (four) times daily as needed (heartburn, indigestion).   Yes [provider]  Amino Acids-Protein Hydrolys (FEEDING SUPPLEMENT, PRO-STAT SUGAR FREE 64,) LIQD Take 30 mLs by mouth 2 (two) times daily. 03/14/19  Yes Oretha Milch D, MD  aspirin 81 MG tablet Take 1 tablet (81 mg total) by mouth daily. For heart health 08/06/11  Yes Greig Castilla, FNP   benzonatate (TESSALON) 100 MG capsule Take 1 capsule (100 mg total) by mouth 3 (three) times daily as needed for cough. 12/11/18  Yes Swayze, Ava, DO  buPROPion (WELLBUTRIN XL) 150 MG 24 hr tablet Take 150 mg by mouth daily.   Yes [provider]  calcium carbonate (TUMS - DOSED IN MG ELEMENTAL CALCIUM) 500 MG chewable tablet Chew 1 tablet (200 mg of elemental calcium total) by mouth 2 (two) times daily as needed for indigestion or heartburn. 12/11/18  Yes Swayze, Ava, DO  docusate sodium (COLACE) 100 MG capsule Take 100 mg by mouth 2 (two) times daily as needed (constipation).    Yes [provider]  docusate sodium (COLACE) 100 MG capsule Take 100 mg by mouth 2 (two) times daily. (scheduled)   Yes [provider]  feeding supplement, ENSURE ENLIVE, (ENSURE ENLIVE) LIQD Take 237 mLs by mouth daily. 03/14/19  Yes Desiree Hane, MD  fluticasone (FLONASE) 50 MCG/ACT nasal spray Place 2 sprays into both nostrils 2 (two) times daily.   Yes  [provider]  fluticasone furoate-vilanterol (BREO ELLIPTA) 100-25 MCG/INH AEPB Inhale 1 puff into the lungs daily.   Yes [provider]  furosemide (LASIX) 20 MG tablet Take 20 mg by mouth 2 (two) times daily.   Yes [provider]  gabapentin (NEURONTIN) 100 MG capsule Take 1 capsule (100 mg total) by mouth at bedtime. Patient taking differently: Take 100 mg by mouth daily.  12/11/18  Yes Swayze, Ava, DO  guaiFENesin (MUCINEX) 600 MG 12 hr tablet Take 1,200 mg by mouth daily.   Yes [provider]  guaifenesin (ROBAFEN) 100 MG/5ML syrup Take 200 mg by mouth 3 (three) times daily as needed for cough or congestion.   Yes [provider]  hydrOXYzine (ATARAX/VISTARIL) 25 MG tablet Take 1 tablet (25 mg total) by mouth 3 (three) times daily as needed for anxiety. Patient taking differently: Take 25 mg by mouth 3 (three) times daily.  10/19/17  Yes Aline August, MD  ipratropium-albuterol (DUONEB)  0.5-2.5 (3) MG/3ML SOLN Take 3 mLs by nebulization See admin instructions. Inhale one vial via hand held nebulizer three times daily, may also use every 8 hours as needed for shortness of breath   Yes [provider]  loratadine (CLARITIN) 10 MG tablet Take 10 mg by mouth daily.   Yes [provider]  lurasidone (LATUDA) 40 MG TABS tablet Take 1 tablet (40 mg total) by mouth 2 (two) times a day. 12/18/18  Yes Kayleen Memos, DO  Melatonin 5 MG TABS Take 5 mg by mouth at bedtime.   Yes [provider]  methocarbamol (ROBAXIN) 500 MG tablet Take 500 mg by mouth every 8 (eight) hours as needed for muscle spasms.   Yes [provider]  methocarbamol (ROBAXIN) 500 MG tablet Take 500 mg by mouth daily. (scheduled)   Yes [provider]  metoprolol tartrate (LOPRESSOR) 25 MG tablet Take 1 tablet (25 mg total) by mouth 2 (two) times daily. 12/17/18  Yes Kayleen Memos, DO  Multiple Vitamin (MULTIVITAMIN WITH MINERALS) TABS tablet Take 1 tablet by mouth daily. 03/15/19  Yes Oretha Milch D, MD  NIFEdipine (PROCARDIA XL/ADALAT-CC) 90 MG 24 hr tablet Take 90 mg by mouth daily.   Yes [provider]  nystatin (NYSTATIN) powder Apply topically 2 (two) times daily as needed (to reddened area twice daily as needed for irritation).   Yes [provider]  omeprazole (PRILOSEC) 40 MG capsule Take 1 capsule (40 mg total) by mouth 2 (two) times daily. Patient taking differently: Take 40 mg by mouth at bedtime.  03/16/19  Yes Oretha Milch D, MD  oxyCODONE (OXY IR/ROXICODONE) 5 MG immediate release tablet Take 1 tablet (5 mg total) by mouth daily. Patient taking differently: Take 5 mg by mouth every 12 (twelve) hours as needed for moderate pain or severe pain.  12/18/18  Yes Hall, Carole N, DO  polyethylene glycol (MIRALAX) 17 g packet Take 17 g by mouth daily. 12/11/18  Yes Swayze, Ava, DO  QUEtiapine (SEROQUEL) 50 MG tablet Take 1 tablet (50 mg total) by mouth  at bedtime. Patient taking differently: Take 100 mg by mouth 2 (two) times daily.  12/11/18  Yes Swayze, Ava, DO  sodium chloride 1 g tablet Take 1 tablet (1 g total) by mouth 3 (three) times daily with meals. 12/17/18  Yes Kayleen Memos, DO  valproic acid (DEPAKENE) 250 MG capsule Take 2 capsules (500 mg total) by mouth 2 (two) times daily. 12/11/18  Yes Swayze, Ava,  DO  vitamin C (ASCORBIC ACID) 500 MG tablet Take 500 mg by mouth 2 (two) times daily.   Yes [provider]  Zinc Gluconate 100 MG TABS Take 100 mg by mouth daily.   Yes [provider]  arformoterol (BROVANA) 15 MCG/2ML NEBU Take 2 mLs (15 mcg total) by nebulization 2 (two) times daily. Patient not taking: Reported on 03/09/2019 12/11/18   Swayze, Ava, DO  asenapine (SAPHRIS) 5 MG SUBL 24 hr tablet Place 2 tablets (10 mg total) under the tongue 2 (two) times daily. Patient not taking: Reported on 04/21/2019 03/14/19   Oretha Milch D, MD  budesonide (PULMICORT) 0.5 MG/2ML nebulizer solution Take 2 mLs (0.5 mg total) by nebulization 2 (two) times daily. Patient not taking: Reported on 03/09/2019 12/11/18   Swayze, Ava, DO  diazepam (VALIUM) 2 MG tablet Take 1 tablet (2 mg total) by mouth 2 (two) times a day. Patient not taking: Reported on 03/09/2019 12/18/18   Kayleen Memos, DO  nicotine (NICODERM CQ - DOSED IN MG/24 HOURS) 21 mg/24hr patch Place 1 patch (21 mg total) onto the skin daily. Patient not taking: Reported on 03/09/2019 12/12/18   Swayze, Ava, DO  QUEtiapine (SEROQUEL) 25 MG tablet Take 1 tablet (25 mg total) by mouth daily. Patient not taking: Reported on 03/09/2019 12/12/18   Swayze, Ava, DO  traZODone (DESYREL) 50 MG tablet Take 1 tablet (50 mg total) by mouth at bedtime. Patient not taking: Reported on 03/09/2019 12/11/18   Swayze, Ava, DO    Family History No family history on file.  Social History Social History   Tobacco Use  . Smoking status: Current Every Day Smoker    Packs/day: 0.25    Types:  Cigarettes  . Smokeless tobacco: Never Used  Substance Use Topics  . Alcohol use: No  . Drug use: No     Allergies   Clarithromycin and Penicillins   Review of Systems Review of Systems  All other systems reviewed and are negative.    Physical Exam Updated Vital Signs BP (!) 236/138 (BP Location: Right Arm)   Pulse 88   Resp 18   SpO2 91%   Physical Exam Vitals signs and nursing note reviewed.  Constitutional:      General: She is not in acute distress.    Appearance: She is overweight. She is ill-appearing.  HENT:     Head: Normocephalic and atraumatic.     Right Ear: External ear normal.     Left Ear: External ear normal.  Eyes:     General: No scleral icterus.       Right eye: No discharge.        Left eye: No discharge.     Conjunctiva/sclera: Conjunctivae normal.  Neck:     Musculoskeletal: Neck supple.     Trachea: No tracheal deviation.  Cardiovascular:     Rate and Rhythm: Normal rate and regular rhythm.  Pulmonary:     Effort: Pulmonary effort is normal. No respiratory distress.     Breath sounds: Normal breath sounds. No stridor. No wheezing or rales.  Abdominal:     General: Bowel sounds are normal. There is no distension.     Palpations: Abdomen is soft.     Tenderness: There is generalized abdominal tenderness. There is no guarding or rebound.     Hernia: No hernia is present.     Comments: Dried emesis on her clothing,  Musculoskeletal:        General: No tenderness.  Skin:    General: Skin is warm and dry.     Findings: No rash.  Neurological:     Mental Status: She is alert.     GCS: GCS eye subscore is 4. GCS verbal subscore is 4. GCS motor subscore is 6.     Cranial Nerves: No cranial nerve deficit (no facial droop, extraocular movements intact, no slurred speech).     Sensory: No sensory deficit.     Motor: No abnormal muscle tone or seizure activity.     Coordination: Coordination normal.     Comments: Equal grip strength  bilaterally, normal plantar flexion bilaterally, patient follows commands, patient is oriented to person place but does not know the year  Psychiatric:        Mood and Affect: Mood is anxious.      ED Treatments / Results  Labs (all labs ordered are listed, but only abnormal results are displayed) Labs Reviewed  CBC - Abnormal; Notable for the following components:      Result Value   RBC 5.35 (*)    Hemoglobin 15.3 (*)    All other components within normal limits  COMPREHENSIVE METABOLIC PANEL - Abnormal; Notable for the following components:   Sodium 121 (*)    Chloride 86 (*)    Glucose, Bld 157 (*)    All other components within normal limits  SARS CORONAVIRUS 2 (TAT 6-24 HRS)  LIPASE, BLOOD  URINALYSIS, ROUTINE W REFLEX MICROSCOPIC    EKG None  Radiology Ct Abdomen Pelvis W Contrast  Result Date: 04/21/2019 CLINICAL DATA:  Generalized abdomen pain. EXAM: CT ABDOMEN AND PELVIS WITH CONTRAST TECHNIQUE: Multidetector CT imaging of the abdomen and pelvis was performed using the standard protocol following bolus administration of intravenous contrast. CONTRAST:  145mL OMNIPAQUE IOHEXOL 300 MG/ML  SOLN COMPARISON:  March 13, 2019 FINDINGS: Lower chest: Mild atelectasis of posterior lung bases are noted. The heart size is probably enlarged. Hepatobiliary: No focal liver abnormality is seen. Status post cholecystectomy. No biliary dilatation. Pancreas: Unremarkable. No pancreatic ductal dilatation or surrounding inflammatory changes. Spleen: Normal in size without focal abnormality. Adrenals/Urinary Tract: The bilateral adrenal glands are normal. Right kidney cysts are noted. There is no hydronephrosis bilaterally. The bladder is distended. Stomach/Bowel: The stomach is normal. There is umbilical herniation of bowel loops without evidence of incarceration or bowel obstruction. There is no diverticulitis. The appendix is not seen but no inflammation is noted around cecum.  Vascular/Lymphatic: Aortic atherosclerosis. No enlarged abdominal or pelvic lymph nodes. Reproductive: The uterus is not seen. No abnormal mass is identified in the bilateral adnexa. Other: None. Musculoskeletal: Degenerative joint changes of the spine are identified. Chronic compression deformities of several vertebral bodies are unchanged. IMPRESSION: Umbilical herniation of bowel loops without evidence of incarceration or bowel obstruction. No acute abnormality is identified in the abdomen and pelvis. Electronically Signed   By: Abelardo Diesel M.D.   On: 04/21/2019 21:24   Dg Abdomen Acute W/chest  Result Date: 04/21/2019 CLINICAL DATA:  Nausea and vomiting EXAM: DG ABDOMEN ACUTE W/ 1V CHEST COMPARISON:  03/13/2019 FINDINGS: Cardiac shadow is enlarged in size. Aortic calcifications are noted. Lungs are well aerated bilaterally. Previously seen left pleural effusion has resolved in the interval. Minimal left basilar atelectasis is seen. Scattered large and small bowel gas is noted. No definitive obstructive changes are seen. Changes of prior sacroplasty are noted. Surgical repair of the proximal right femur is noted as well. No free air is seen. IMPRESSION: Minimal left  basilar atelectasis. No acute abnormality noted in the abdomen. Electronically Signed   By: Inez Catalina M.D.   On: 04/21/2019 19:48    Procedures .Critical Care Performed by: Dorie Rank, MD Authorized by: Dorie Rank, MD   Critical care provider statement:    Critical care time (minutes):  45   Critical care was time spent personally by me on the following activities:  Discussions with consultants, evaluation of patient's response to treatment, examination of patient, ordering and performing treatments and interventions, ordering and review of laboratory studies, ordering and review of radiographic studies, pulse oximetry, re-evaluation of patient's condition, obtaining history from patient or surrogate and review of old charts    (including critical care time)  Medications Ordered in ED Medications  sodium chloride 0.9 % bolus 1,000 mL (1,000 mLs Intravenous New Bag/Given 04/21/19 2020)    Followed by  0.9 %  sodium chloride infusion (has no administration in time range)  sodium chloride (PF) 0.9 % injection (has no administration in time range)  labetalol (NORMODYNE) injection 20 mg (has no administration in time range)  ondansetron (ZOFRAN) injection 4 mg (4 mg Intravenous Given 04/21/19 2021)  morphine 4 MG/ML injection 4 mg (4 mg Intravenous Given 04/21/19 2021)  iohexol (OMNIPAQUE) 300 MG/ML solution 100 mL (100 mLs Intravenous Contrast Given 04/21/19 2034)     Initial Impression / Assessment and Plan / ED Course  I have reviewed the triage vital signs and the nursing notes.  Pertinent labs & imaging results that were available during my care of the patient were reviewed by me and considered in my medical decision making (see chart for details).  Clinical Course as of Apr 21 2215  Tue Apr 21, 2019  1931 Worsening hyponatremia compared to prior labs.   [JK]  2210 BP 224/84 at the bedside.  Labetalol ordered.  CT scan findings reviewed.   [JK]    Clinical Course User Index [JK] Dorie Rank, MD     Patient's laboratory tests were notable for worsening hyponatremia.  Patient also had persistent nausea and vomiting.  Patient CT scan demonstrates umbilical hernia but no evidence of obstruction.  Patient's vomiting improved with treatment.  Patient was noted to be hypertensive.  Patient was given a dose of labetalol.  Patient's had similar presentations and was admitted to the hospital last month for hypertension and hyponatremia.  Patient was started on a saline infusion.  I do not feel that she requires hypertonic saline at this time.  Patient was also given a dose of labetalol.  Plan admission to the hospital for further treatment.  Final Clinical Impressions(s) / ED Diagnoses   Final diagnoses:   Hyponatremia  Nausea and vomiting, intractability of vomiting not specified, unspecified vomiting type  Hypertension, unspecified type      Dorie Rank, MD 04/21/19 2218

## 2019-04-21 NOTE — ED Triage Notes (Signed)
58 yo female bib ems from [facility]. Staff reports nausea vomiting and increased confusion since this morning. Staff states increased behavioral episodes since pt was switched from valium to xanax. Pt is AOx2 as per EMS. Pt has history of scitzoaffective disorder. PT has one episode of emesis in route. Pt has had 4mg  of zofran. 20g L forearm  Vitals: 151/103 cbg 164 spo2 99 on 3 liters (pt is on 3 L normally) Hr 77 reg rr 20 reg

## 2019-04-21 NOTE — ED Notes (Signed)
Attempted to call report, no answer will try back

## 2019-04-21 NOTE — ED Notes (Signed)
Patient transported to CT 

## 2019-04-21 NOTE — H&P (Signed)
History and Physical   Natalie Thomas [id number] DOB: 11-03-1960 DOA: 04/21/2019  Referring MD/NP/PA: Dr. Tomi Bamberger  PCP: System, Pcp Not In   Outpatient Specialists: None  Patient coming from: [facility] skilled nursing facility  Chief Complaint: Nausea and vomiting  HPI: Natalie Thomas is a 58 y.o. female with medical history significant of hypertension, COPD, diabetes, anxiety with depression, degenerative disc disease GERD who presented to the ER with persistent nausea vomiting and abdominal pain.  She has history of diabetes with schizoaffective disorder.  She has some abdominal pain.  No persistent diarrhea.  No fever or chills no contact with anybody with COVID-19.  She lives in the skilled facility where the staff noted the nausea vomiting and increasing confusion only this morning.  She was previously on Valium but switch to Xanax and that has triggered some changes in her.  She was noted to have hypertensive urgency in addition to sodium of 121.  She does not appear to be dehydrated.  Her BUN is 14.  Patient is being admitted to the hospital for evaluation of hyponatremia but also hypertensive urgency..  ED Course: Temperature is 98.2 blood pressure 236/138 pulse 88 respirate of 19 oxygen sats 91% room air.  White count 9.9 hemoglobin 15.3 and platelets 205.  Sodium is 121 potassium 4.0 chloride 86 CO2 24 BUN 14 creatinine 0.51 calcium 9.1 glucose 157.  CT abdomen pelvis showed mainly umbilical hernia no acute findings.  Acute abdominal series also showed no significant findings.  Patient is being admitted for further evaluation  Review of Systems: As per HPI otherwise 10 point review of systems negative.    Past Medical History:  Diagnosis Date  . Acute on chronic respiratory failure with hypoxia and hypercapnia ([place]) 10/07/2017  . Anemia 10/16/2017  . Anxiety and depression   . CHF (congestive heart failure) ([place])   . Chronic hyponatremia 04/08/2007   Qualifier:  Diagnosis of  By: Marca Ancona RMA, Lucy    . Chronic respiratory failure with hypoxia ([place]) 10/07/2017  . Closed comminuted intertrochanteric fracture of right femur ([place]) 11/07/2017  . COPD 04/08/2007   Qualifier: Diagnosis of  By: Marca Ancona RMA, Lucy    . Cough 05/08/2011  . Depression   . Diabetes mellitus, type 2 ([place])    pt denies but states that she has been treated for DM  . Dyspnea 10/16/2017  . Dysuria 04/08/2007   Qualifier: Diagnosis of  By: Reatha Armour, Lucy    . Essential hypertension 04/08/2007   Qualifier: Diagnosis of  By: Marca Ancona RMA, Lucy    . GERD (gastroesophageal reflux disease)   . HIP PAIN, LEFT 12/12/2007   Qualifier: Diagnosis of  By: Loanne Drilling MD, Jacelyn Pi   . Hyperlipidemia 10/07/2017  . Hypertension   . Hyponatremia 07/27/2011  . Incontinence of urine 08/14/2011  . Lumbar disc disease 04/09/2011  . MENOPAUSE, PREMATURE 04/08/2007   Qualifier: Diagnosis of  By: Marca Ancona RMA, Lucy    . 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 Osteoporosis   . Osteoporosis 04/08/2007   Qualifier: Diagnosis of  By: Reatha Armour, Lucy    . Polycythemia secondary to smoking 04/09/2011  . S/P right hip fracture 11/07/2017  . Schizoaffective disorder, bipolar type ([place]) 07/05/2011    Past Surgical History:  Procedure Laterality Date  . EYE SURGERY     on left  eye, pt states that she sees bad out of the right eye and  needs surgery there  . INTRAMEDULLARY (IM) NAIL INTERTROCHANTERIC Right 11/13/2017   Procedure: INTRAMEDULLARY (IM) NAIL INTERTROCHANTRIC;  Surgeon: Shona Needles, MD;  Location: [facility];  Service: Orthopedics;  Laterality: Right;  . PELVIC FRACTURE SURGERY       reports that she has been smoking cigarettes. She has been smoking about 0.25 packs per day. She has never used smokeless tobacco. She reports that she does not drink alcohol or use drugs.  Allergies  Allergen Reactions  . Clarithromycin Itching  . Penicillins Itching    Has  tolerated cefazolin before  Has patient had a PCN reaction causing immediate rash, facial/tongue/throat swelling, SOB or lightheadedness with hypotension: No Has patient had a PCN reaction causing severe rash involving mucus membranes or skin necrosis: No Has patient had a PCN reaction that required hospitalization: Unknown Has patient had a PCN reaction occurring within the last 10 years: No If all of the above answers are "NO", then may proceed with Cephalosporin use.     No family history on file.   Prior to Admission medications   Medication Sig Start Date End Date Taking? Authorizing Provider  acetaminophen (TYLENOL) 325 MG tablet Take 650 mg by mouth 2 (two) times daily as needed (pain). Do not exceed 3 grams/24 hours of tylenol from all sources   Yes [provider]  acetaminophen (TYLENOL) 500 MG tablet Take 500 mg by mouth every 6 (six) hours as needed for fever.   Yes [provider]  albuterol (PROVENTIL) (2.5 MG/3ML) 0.083% nebulizer solution Take 3 mLs (2.5 mg total) by nebulization every 4 (four) hours as needed for wheezing or shortness of breath. 12/11/18  Yes Swayze, Ava, DO  ALPRAZolam (XANAX) 0.5 MG tablet Take 0.5 mg by mouth 2 (two) times daily.   Yes [provider]  aluminum-magnesium hydroxide-simethicone (MAALOX) 200-200-20 MG/5ML SUSP Take 30 mLs by mouth 4 (four) times daily as needed (heartburn, indigestion).   Yes [provider]  Amino Acids-Protein Hydrolys (FEEDING SUPPLEMENT, PRO-STAT SUGAR FREE 64,) LIQD Take 30 mLs by mouth 2 (two) times daily. 03/14/19  Yes Oretha Milch D, MD  aspirin 81 MG tablet Take 1 tablet (81 mg total) by mouth daily. For heart health 08/06/11  Yes Greig Castilla, FNP  benzonatate (TESSALON) 100 MG capsule Take 1 capsule (100 mg total) by mouth 3 (three) times daily as needed for cough. 12/11/18  Yes Swayze, Ava, DO  buPROPion (WELLBUTRIN XL) 150 MG 24 hr tablet Take 150 mg by mouth daily.   Yes  [provider]  calcium carbonate (TUMS - DOSED IN MG ELEMENTAL CALCIUM) 500 MG chewable tablet Chew 1 tablet (200 mg of elemental calcium total) by mouth 2 (two) times daily as needed for indigestion or heartburn. 12/11/18  Yes Swayze, Ava, DO  docusate sodium (COLACE) 100 MG capsule Take 100 mg by mouth 2 (two) times daily as needed (constipation).    Yes [provider]  docusate sodium (COLACE) 100 MG capsule Take 100 mg by mouth 2 (two) times daily. (scheduled)   Yes [provider]  feeding supplement, ENSURE ENLIVE, (ENSURE ENLIVE) LIQD Take 237 mLs by mouth daily. 03/14/19  Yes Desiree Hane, MD  fluticasone (FLONASE) 50 MCG/ACT nasal spray Place 2 sprays into both nostrils 2 (two) times daily.   Yes [provider]  fluticasone furoate-vilanterol (BREO ELLIPTA) 100-25 MCG/INH AEPB Inhale 1 puff into the lungs daily.   Yes [provider]  furosemide (LASIX) 20 MG tablet Take  20 mg by mouth 2 (two) times daily.   Yes [provider]  gabapentin (NEURONTIN) 100 MG capsule Take 1 capsule (100 mg total) by mouth at bedtime. Patient taking differently: Take 100 mg by mouth daily.  12/11/18  Yes Swayze, Ava, DO  guaiFENesin (MUCINEX) 600 MG 12 hr tablet Take 1,200 mg by mouth daily.   Yes [provider]  guaifenesin (ROBAFEN) 100 MG/5ML syrup Take 200 mg by mouth 3 (three) times daily as needed for cough or congestion.   Yes [provider]  hydrOXYzine (ATARAX/VISTARIL) 25 MG tablet Take 1 tablet (25 mg total) by mouth 3 (three) times daily as needed for anxiety. Patient taking differently: Take 25 mg by mouth 3 (three) times daily.  10/19/17  Yes Aline August, MD  ipratropium-albuterol (DUONEB) 0.5-2.5 (3) MG/3ML SOLN Take 3 mLs by nebulization See admin instructions. Inhale one vial via hand held nebulizer three times daily, may also use every 8 hours as needed for shortness of breath   Yes [provider]   loratadine (CLARITIN) 10 MG tablet Take 10 mg by mouth daily.   Yes [provider]  lurasidone (LATUDA) 40 MG TABS tablet Take 1 tablet (40 mg total) by mouth 2 (two) times a day. 12/18/18  Yes Kayleen Memos, DO  Melatonin 5 MG TABS Take 5 mg by mouth at bedtime.   Yes [provider]  methocarbamol (ROBAXIN) 500 MG tablet Take 500 mg by mouth every 8 (eight) hours as needed for muscle spasms.   Yes [provider]  methocarbamol (ROBAXIN) 500 MG tablet Take 500 mg by mouth daily. (scheduled)   Yes [provider]  metoprolol tartrate (LOPRESSOR) 25 MG tablet Take 1 tablet (25 mg total) by mouth 2 (two) times daily. 12/17/18  Yes Kayleen Memos, DO  Multiple Vitamin (MULTIVITAMIN WITH MINERALS) TABS tablet Take 1 tablet by mouth daily. 03/15/19  Yes Oretha Milch D, MD  NIFEdipine (PROCARDIA XL/ADALAT-CC) 90 MG 24 hr tablet Take 90 mg by mouth daily.   Yes [provider]  nystatin (NYSTATIN) powder Apply topically 2 (two) times daily as needed (to reddened area twice daily as needed for irritation).   Yes [provider]  omeprazole (PRILOSEC) 40 MG capsule Take 1 capsule (40 mg total) by mouth 2 (two) times daily. Patient taking differently: Take 40 mg by mouth at bedtime.  03/16/19  Yes Oretha Milch D, MD  oxyCODONE (OXY IR/ROXICODONE) 5 MG immediate release tablet Take 1 tablet (5 mg total) by mouth daily. Patient taking differently: Take 5 mg by mouth every 12 (twelve) hours as needed for moderate pain or severe pain.  12/18/18  Yes Hall, Carole N, DO  polyethylene glycol (MIRALAX) 17 g packet Take 17 g by mouth daily. 12/11/18  Yes Swayze, Ava, DO  QUEtiapine (SEROQUEL) 50 MG tablet Take 1 tablet (50 mg total) by mouth at bedtime. Patient taking differently: Take 100 mg by mouth 2 (two) times daily.  12/11/18  Yes Swayze, Ava, DO  sodium chloride 1 g tablet Take 1 tablet (1 g total) by mouth 3 (three) times daily with meals. 12/17/18  Yes  Kayleen Memos, DO  valproic acid (DEPAKENE) 250 MG capsule Take 2 capsules (500 mg total) by mouth 2 (two) times daily. 12/11/18  Yes Swayze, Ava, DO  vitamin C (ASCORBIC ACID) 500 MG tablet Take 500 mg by mouth 2 (two) times daily.   Yes [provider]  Zinc Gluconate 100 MG TABS Take  100 mg by mouth daily.   Yes [provider]  arformoterol (BROVANA) 15 MCG/2ML NEBU Take 2 mLs (15 mcg total) by nebulization 2 (two) times daily. Patient not taking: Reported on 03/09/2019 12/11/18   Swayze, Ava, DO  asenapine (SAPHRIS) 5 MG SUBL 24 hr tablet Place 2 tablets (10 mg total) under the tongue 2 (two) times daily. Patient not taking: Reported on 04/21/2019 03/14/19   Oretha Milch D, MD  budesonide (PULMICORT) 0.5 MG/2ML nebulizer solution Take 2 mLs (0.5 mg total) by nebulization 2 (two) times daily. Patient not taking: Reported on 03/09/2019 12/11/18   Swayze, Ava, DO  diazepam (VALIUM) 2 MG tablet Take 1 tablet (2 mg total) by mouth 2 (two) times a day. Patient not taking: Reported on 03/09/2019 12/18/18   Kayleen Memos, DO  nicotine (NICODERM CQ - DOSED IN MG/24 HOURS) 21 mg/24hr patch Place 1 patch (21 mg total) onto the skin daily. Patient not taking: Reported on 03/09/2019 12/12/18   Swayze, Ava, DO  QUEtiapine (SEROQUEL) 25 MG tablet Take 1 tablet (25 mg total) by mouth daily. Patient not taking: Reported on 03/09/2019 12/12/18   Swayze, Ava, DO  traZODone (DESYREL) 50 MG tablet Take 1 tablet (50 mg total) by mouth at bedtime. Patient not taking: Reported on 03/09/2019 12/11/18   Karie Kirks, DO    Physical Exam: Vitals:   04/21/19 1800 04/21/19 2029  BP: (!) 204/92 (!) 236/138  Pulse: 74 88  Resp: 19 18  SpO2: 99% 91%      Constitutional: Chronically ill-looking, confused Vitals:   04/21/19 1800 04/21/19 2029  BP: (!) 204/92 (!) 236/138  Pulse: 74 88  Resp: 19 18  SpO2: 99% 91%   Eyes: PERRL, lids and conjunctivae normal ENMT: Mucous membranes are moist. Posterior  pharynx clear of any exudate or lesions.Normal dentition.  Neck: normal, supple, no masses, no thyromegaly Respiratory: clear to auscultation bilaterally, no wheezing, no crackles. Normal respiratory effort. No accessory muscle use.  Cardiovascular: Regular rate and rhythm, no murmurs / rubs / gallops. No extremity edema. 2+ pedal pulses. No carotid bruits.  Abdomen: mild tenderness, no masses palpated. No hepatosplenomegaly. Bowel sounds positive.  Musculoskeletal: no clubbing / cyanosis. No joint deformity upper and lower extremities. Good ROM, no contractures. Normal muscle tone.  Skin: no rashes, lesions, ulcers. No induration Neurologic: CN 2-12 grossly intact. Sensation intact, DTR normal. Strength 5/5 in all 4.  Psychiatric: Confused, mildly agitated     Labs on Admission: I have personally reviewed following labs and imaging studies  CBC: Recent Labs  Lab 04/21/19 1746  WBC 9.9  HGB 15.3*  HCT 44.2  MCV 82.6  PLT 0000000   Basic Metabolic Panel: Recent Labs  Lab 04/21/19 1746  NA 121*  K 4.0  CL 86*  CO2 24  GLUCOSE 157*  BUN 14  CREATININE 0.51  CALCIUM 9.0   GFR: CrCl cannot be calculated (Unknown ideal weight.). Liver Function Tests: Recent Labs  Lab 04/21/19 1746  AST 17  ALT 12  ALKPHOS 100  BILITOT 0.5  PROT 8.0  ALBUMIN 3.7   Recent Labs  Lab 04/21/19 1746  LIPASE 32   No results for input(s): AMMONIA in the last 168 hours. Coagulation Profile: No results for input(s): INR, PROTIME in the last 168 hours. Cardiac Enzymes: No results for input(s): CKTOTAL, CKMB, CKMBINDEX, TROPONINI in the last 168 hours. BNP (last 3 results) No results for input(s): PROBNP in the last 8760 hours. HbA1C: No results for input(s):  HGBA1C in the last 72 hours. CBG: No results for input(s): GLUCAP in the last 168 hours. Lipid Profile: No results for input(s): CHOL, HDL, LDLCALC, TRIG, CHOLHDL, LDLDIRECT in the last 72 hours. Thyroid Function Tests: No results  for input(s): TSH, T4TOTAL, FREET4, T3FREE, THYROIDAB in the last 72 hours. Anemia Panel: No results for input(s): VITAMINB12, FOLATE, FERRITIN, TIBC, IRON, RETICCTPCT in the last 72 hours. Urine analysis:    Component Value Date/Time   COLORURINE STRAW (A) 03/09/2019 1655   APPEARANCEUR HAZY (A) 03/09/2019 1655   APPEARANCEUR Clear 06/22/2012 0618   LABSPEC 1.002 (L) 03/09/2019 1655   LABSPEC 1.003 06/22/2012 0618   PHURINE 7.0 03/09/2019 1655   GLUCOSEU NEGATIVE 03/09/2019 1655   GLUCOSEU Negative 06/22/2012 0618   GLUCOSEU NEGATIVE 04/09/2011 1058   HGBUR SMALL (A) 03/09/2019 1655   BILIRUBINUR NEGATIVE 03/09/2019 1655   BILIRUBINUR Negative 06/22/2012 0618   KETONESUR NEGATIVE 03/09/2019 1655   PROTEINUR NEGATIVE 03/09/2019 1655   UROBILINOGEN 1.0 08/18/2011 1805   NITRITE POSITIVE (A) 03/09/2019 1655   LEUKOCYTESUR LARGE (A) 03/09/2019 1655   LEUKOCYTESUR Trace 06/22/2012 0618   Sepsis Labs: @LABRCNTIP (procalcitonin:4,lacticidven:4) )No results found for this or any previous visit (from the past 240 hour(s)).   Radiological Exams on Admission: Ct Abdomen Pelvis W Contrast  Result Date: 04/21/2019 CLINICAL DATA:  Generalized abdomen pain. EXAM: CT ABDOMEN AND PELVIS WITH CONTRAST TECHNIQUE: Multidetector CT imaging of the abdomen and pelvis was performed using the standard protocol following bolus administration of intravenous contrast. CONTRAST:  159mL OMNIPAQUE IOHEXOL 300 MG/ML  SOLN COMPARISON:  March 13, 2019 FINDINGS: Lower chest: Mild atelectasis of posterior lung bases are noted. The heart size is probably enlarged. Hepatobiliary: No focal liver abnormality is seen. Status post cholecystectomy. No biliary dilatation. Pancreas: Unremarkable. No pancreatic ductal dilatation or surrounding inflammatory changes. Spleen: Normal in size without focal abnormality. Adrenals/Urinary Tract: The bilateral adrenal glands are normal. Right kidney cysts are noted. There is no  hydronephrosis bilaterally. The bladder is distended. Stomach/Bowel: The stomach is normal. There is umbilical herniation of bowel loops without evidence of incarceration or bowel obstruction. There is no diverticulitis. The appendix is not seen but no inflammation is noted around cecum. Vascular/Lymphatic: Aortic atherosclerosis. No enlarged abdominal or pelvic lymph nodes. Reproductive: The uterus is not seen. No abnormal mass is identified in the bilateral adnexa. Other: None. Musculoskeletal: Degenerative joint changes of the spine are identified. Chronic compression deformities of several vertebral bodies are unchanged. IMPRESSION: Umbilical herniation of bowel loops without evidence of incarceration or bowel obstruction. No acute abnormality is identified in the abdomen and pelvis. Electronically Signed   By: Abelardo Diesel M.D.   On: 04/21/2019 21:24   Dg Abdomen Acute W/chest  Result Date: 04/21/2019 CLINICAL DATA:  Nausea and vomiting EXAM: DG ABDOMEN ACUTE W/ 1V CHEST COMPARISON:  03/13/2019 FINDINGS: Cardiac shadow is enlarged in size. Aortic calcifications are noted. Lungs are well aerated bilaterally. Previously seen left pleural effusion has resolved in the interval. Minimal left basilar atelectasis is seen. Scattered large and small bowel gas is noted. No definitive obstructive changes are seen. Changes of prior sacroplasty are noted. Surgical repair of the proximal right femur is noted as well. No free air is seen. IMPRESSION: Minimal left basilar atelectasis. No acute abnormality noted in the abdomen. Electronically Signed   By: Inez Catalina M.D.   On: 04/21/2019 19:48      Assessment/Plan Principal Problem:   Malignant hypertensive urgency Active Problems:   DM (diabetes mellitus),  type 2 (HCC)   Schizoaffective disorder (HCC)   GERD   Acute hyponatremia   Chronic respiratory failure with hypoxia (HCC)   Type 2 diabetes mellitus without complication (HCC)   Chronic diastolic CHF  (congestive heart failure) ([place])     #1 malignant hypertension: Patient will be admitted to stepdown unit.  I will do some IV labetalol.  We will hold diuretics because of hyponatremia.  Continue other home medications and titrate to oral medicines.  #2 hyponatremia: Sodium 121.  Patient has been on Lasix.  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  We will get urine and serum osmolality.  Spot urine sodium.  Free water restriction.  Follow closely.  #3 diabetes: Blood sugar appears controlled.  Sliding scale insulin with home regimen  #4 nausea vomiting abdominal pain: Negative CT abdomen.  Continue supportive care  #5 COPD with chronic respiratory failure: Stable with no exacerbation.  Continue monitor  #6 history of CHF: Compensated.   DVT prophylaxis: Lovenox Code Status: Full code Family Communication: No family at bedside Disposition Plan: Back to skilled facility Consults called: None Admission status: Inpatient  Severity of Illness: The appropriate patient status for this patient is INPATIENT. Inpatient status is judged to be reasonable and necessary in order to provide the required intensity of service to ensure the patient's safety. The patient's presenting symptoms, physical exam findings, and initial radiographic and laboratory data in the context of their chronic comorbidities is felt to place them at high risk for further clinical deterioration. Furthermore, it is not anticipated that the patient will be medically stable for discharge from the hospital within 2 midnights of admission. The following factors support the patient status of inpatient.   " The patient's presenting symptoms include nausea vomiting abdominal pain. " The worrisome physical exam findings include chronically ill looking. " The initial radiographic and laboratory data are worrisome because of negative findings except for sodium of 121. " The  chronic co-morbidities include COPD with depression.   * I certify that at the point of admission it is my clinical judgment that the patient will require inpatient hospital care spanning beyond 2 midnights from the point of admission due to high intensity of service, high risk for further deterioration and high frequency of surveillance required.Barbette Merino MD [facility] Pager [phone]  If 7PM-7AM, please contact night-coverage [email or web address] Password [facility]  04/21/2019, 10:36 PM

## 2019-04-21 NOTE — ED Notes (Signed)
Pt changed and pur wick placed

## 2019-04-22 ENCOUNTER — Other Ambulatory Visit: Payer: Self-pay

## 2019-04-22 ENCOUNTER — Encounter (HOSPITAL_COMMUNITY): Payer: Self-pay

## 2019-04-22 LAB — COMPREHENSIVE METABOLIC PANEL
ALT: 15 U/L (ref 0–44)
AST: 17 U/L (ref 15–41)
Albumin: 3.7 g/dL (ref 3.5–5.0)
Alkaline Phosphatase: 102 U/L (ref 38–126)
Anion gap: 10 (ref 5–15)
BUN: 9 mg/dL (ref 6–20)
CO2: 24 mmol/L (ref 22–32)
Calcium: 9.2 mg/dL (ref 8.9–10.3)
Chloride: 93 mmol/L — ABNORMAL LOW (ref 98–111)
Creatinine, Ser: 0.49 mg/dL (ref 0.44–1.00)
GFR calc Af Amer: >60 mL/min (ref >60)
GFR calc non Af Amer: >60 mL/min (ref >60)
Glucose, Bld: 129 mg/dL — ABNORMAL HIGH (ref 70–99)
Potassium: 3.9 mmol/L (ref 3.5–5.1)
Sodium: 127 mmol/L — ABNORMAL LOW (ref 135–145)
Total Bilirubin: 0.3 mg/dL (ref 0.3–1.2)
Total Protein: 7.7 g/dL (ref 6.5–8.1)

## 2019-04-22 LAB — CBC
HCT: 47.6 % — ABNORMAL HIGH (ref 36.0–46.0)
Hemoglobin: 16.1 g/dL — ABNORMAL HIGH (ref 12.0–15.0)
MCH: 28.3 pg (ref 26.0–34.0)
MCHC: 33.8 g/dL (ref 30.0–36.0)
MCV: 83.8 fL (ref 80.0–100.0)
Platelets: 219 10*3/uL (ref 150–400)
RBC: 5.68 MIL/uL — ABNORMAL HIGH (ref 3.87–5.11)
RDW: 13 % (ref 11.5–15.5)
WBC: 8.5 10*3/uL (ref 4.0–10.5)
nRBC: 0 % (ref 0.0–0.2)

## 2019-04-22 LAB — OSMOLALITY: Osmolality: 274 mOsm/kg — ABNORMAL LOW (ref 275–295)

## 2019-04-22 LAB — SARS CORONAVIRUS 2 (TAT 6-24 HRS): SARS Coronavirus 2: NEGATIVE

## 2019-04-22 LAB — MRSA PCR SCREENING: MRSA by PCR: POSITIVE — AB

## 2019-04-22 LAB — SODIUM, URINE, RANDOM: Sodium, Ur: 32 mmol/L

## 2019-04-22 LAB — OSMOLALITY, URINE: Osmolality, Ur: 125 mOsm/kg — ABNORMAL LOW (ref 300–900)

## 2019-04-22 MED ORDER — NYSTATIN 100000 UNIT/GM EX POWD: Freq: Two times a day (BID) | CUTANEOUS | Status: DC | PRN

## 2019-04-22 MED ORDER — PRO-STAT SUGAR FREE PO LIQD
30.0000 mL | Freq: Two times a day (BID) | ORAL | Status: DC
  Administered 2019-04-22 – 2019-04-24 (×3): 30 mL via ORAL
  Filled 2019-04-22 (×5): qty 30

## 2019-04-22 MED ORDER — GABAPENTIN 100 MG PO CAPS
100.0000 mg | ORAL_CAPSULE | Freq: Every day | ORAL | Status: DC
  Administered 2019-04-22 – 2019-04-24 (×3): 100 mg via ORAL
  Filled 2019-04-22 (×3): qty 1

## 2019-04-22 MED ORDER — MUPIROCIN 2 % EX OINT
1.0000 "application " | TOPICAL_OINTMENT | Freq: Two times a day (BID) | CUTANEOUS | Status: DC
  Administered 2019-04-22 – 2019-04-24 (×5): 1 via NASAL
  Filled 2019-04-22 (×2): qty 22

## 2019-04-22 MED ORDER — POLYETHYLENE GLYCOL 3350 17 G PO PACK
17.0000 g | PACK | Freq: Every day | ORAL | Status: DC
  Administered 2019-04-22 – 2019-04-24 (×2): 17 g via ORAL
  Filled 2019-04-22 (×3): qty 1

## 2019-04-22 MED ORDER — ACETAMINOPHEN 325 MG PO TABS: 650.0000 mg | ORAL_TABLET | Freq: Two times a day (BID) | ORAL | Status: DC | PRN

## 2019-04-22 MED ORDER — CALCIUM CARBONATE ANTACID 500 MG PO CHEW: 1.0000 | CHEWABLE_TABLET | Freq: Two times a day (BID) | ORAL | Status: DC | PRN

## 2019-04-22 MED ORDER — ACETAMINOPHEN 325 MG PO TABS: 650.0000 mg | ORAL_TABLET | Freq: Four times a day (QID) | ORAL | Status: DC | PRN

## 2019-04-22 MED ORDER — NIFEDIPINE ER OSMOTIC RELEASE 60 MG PO TB24
90.0000 mg | ORAL_TABLET | Freq: Every day | ORAL | Status: DC
  Administered 2019-04-22 – 2019-04-24 (×2): 90 mg via ORAL
  Filled 2019-04-22 (×8): qty 1

## 2019-04-22 MED ORDER — ACETAMINOPHEN 500 MG PO TABS: 500.0000 mg | ORAL_TABLET | Freq: Four times a day (QID) | ORAL | Status: DC | PRN

## 2019-04-22 MED ORDER — GUAIFENESIN ER 600 MG PO TB12
1200.0000 mg | ORAL_TABLET | Freq: Every day | ORAL | Status: DC
  Administered 2019-04-22 – 2019-04-24 (×3): 1200 mg via ORAL
  Filled 2019-04-22 (×3): qty 2

## 2019-04-22 MED ORDER — SODIUM CHLORIDE 1 G PO TABS
1.0000 g | ORAL_TABLET | Freq: Three times a day (TID) | ORAL | Status: DC
  Administered 2019-04-22 – 2019-04-24 (×9): 1 g via ORAL
  Filled 2019-04-22 (×8): qty 1

## 2019-04-22 MED ORDER — LISINOPRIL 20 MG PO TABS
20.0000 mg | ORAL_TABLET | Freq: Every day | ORAL | Status: DC
  Administered 2019-04-22 – 2019-04-23 (×2): 20 mg via ORAL
  Filled 2019-04-22: qty 1
  Filled 2019-04-22: qty 2

## 2019-04-22 MED ORDER — DOCUSATE SODIUM 100 MG PO CAPS
100.0000 mg | ORAL_CAPSULE | Freq: Two times a day (BID) | ORAL | Status: DC
  Administered 2019-04-22 – 2019-04-24 (×6): 100 mg via ORAL
  Filled 2019-04-22 (×6): qty 1

## 2019-04-22 MED ORDER — METOPROLOL TARTRATE 25 MG PO TABS
25.0000 mg | ORAL_TABLET | Freq: Two times a day (BID) | ORAL | Status: DC
  Administered 2019-04-22 – 2019-04-24 (×4): 25 mg via ORAL
  Filled 2019-04-22 (×5): qty 1

## 2019-04-22 MED ORDER — FLUTICASONE FUROATE-VILANTEROL 100-25 MCG/INH IN AEPB
1.0000 | INHALATION_SPRAY | Freq: Every day | RESPIRATORY_TRACT | Status: DC
  Administered 2019-04-22 – 2019-04-24 (×3): 1 via RESPIRATORY_TRACT
  Filled 2019-04-22: qty 28

## 2019-04-22 MED ORDER — ALUM & MAG HYDROXIDE-SIMETH 200-200-20 MG/5ML PO SUSP: 30.0000 mL | Freq: Four times a day (QID) | ORAL | Status: DC | PRN

## 2019-04-22 MED ORDER — OXYCODONE HCL 5 MG PO TABS
5.0000 mg | ORAL_TABLET | Freq: Two times a day (BID) | ORAL | Status: DC | PRN
  Administered 2019-04-23 – 2019-04-24 (×2): 5 mg via ORAL
  Filled 2019-04-22 (×2): qty 1

## 2019-04-22 MED ORDER — CHLORHEXIDINE GLUCONATE CLOTH 2 % EX PADS
6.0000 | MEDICATED_PAD | Freq: Every day | CUTANEOUS | Status: DC
  Administered 2019-04-22: 6 via TOPICAL

## 2019-04-22 MED ORDER — ORAL CARE MOUTH RINSE
15.0000 mL | Freq: Two times a day (BID) | OROMUCOSAL | Status: DC
  Administered 2019-04-22 – 2019-04-24 (×4): 15 mL via OROMUCOSAL

## 2019-04-22 MED ORDER — ENSURE ENLIVE PO LIQD
237.0000 mL | Freq: Every day | ORAL | Status: DC
  Administered 2019-04-23 – 2019-04-24 (×2): 237 mL via ORAL

## 2019-04-22 MED ORDER — ADULT MULTIVITAMIN W/MINERALS CH
1.0000 | ORAL_TABLET | Freq: Every day | ORAL | Status: DC
  Administered 2019-04-22 – 2019-04-24 (×3): 1 via ORAL
  Filled 2019-04-22 (×3): qty 1

## 2019-04-22 MED ORDER — ALBUTEROL SULFATE (2.5 MG/3ML) 0.083% IN NEBU: 2.5000 mg | INHALATION_SOLUTION | RESPIRATORY_TRACT | Status: DC | PRN

## 2019-04-22 MED ORDER — HYDROXYZINE HCL 25 MG PO TABS
25.0000 mg | ORAL_TABLET | Freq: Three times a day (TID) | ORAL | Status: DC
  Administered 2019-04-22 – 2019-04-24 (×8): 25 mg via ORAL
  Filled 2019-04-22 (×8): qty 1

## 2019-04-22 MED ORDER — ALPRAZOLAM 0.5 MG PO TABS
0.5000 mg | ORAL_TABLET | Freq: Two times a day (BID) | ORAL | Status: DC
  Administered 2019-04-22 – 2019-04-24 (×6): 0.5 mg via ORAL
  Filled 2019-04-22 (×6): qty 1

## 2019-04-22 MED ORDER — LURASIDONE HCL 40 MG PO TABS
40.0000 mg | ORAL_TABLET | Freq: Every day | ORAL | Status: DC
  Administered 2019-04-22 – 2019-04-24 (×3): 40 mg via ORAL
  Filled 2019-04-22 (×6): qty 1

## 2019-04-22 MED ORDER — HEPARIN SODIUM (PORCINE) 5000 UNIT/ML IJ SOLN
5000.0000 [IU] | Freq: Three times a day (TID) | INTRAMUSCULAR | Status: DC
  Administered 2019-04-22 – 2019-04-24 (×8): 5000 [IU] via SUBCUTANEOUS
  Filled 2019-04-22 (×8): qty 1

## 2019-04-22 MED ORDER — ASPIRIN EC 81 MG PO TBEC
81.0000 mg | DELAYED_RELEASE_TABLET | Freq: Every day | ORAL | Status: DC
  Administered 2019-04-22 – 2019-04-24 (×3): 81 mg via ORAL
  Filled 2019-04-22 (×3): qty 1

## 2019-04-22 MED ORDER — ZINC SULFATE 220 (50 ZN) MG PO CAPS
220.0000 mg | ORAL_CAPSULE | Freq: Every day | ORAL | Status: DC
  Administered 2019-04-22 – 2019-04-24 (×3): 220 mg via ORAL
  Filled 2019-04-22 (×3): qty 1

## 2019-04-22 MED ORDER — MELATONIN 5 MG PO TABS
5.0000 mg | ORAL_TABLET | Freq: Every day | ORAL | Status: DC
  Administered 2019-04-22 – 2019-04-23 (×3): 5 mg via ORAL
  Filled 2019-04-22 (×3): qty 1

## 2019-04-22 MED ORDER — FLUTICASONE PROPIONATE 50 MCG/ACT NA SUSP
2.0000 | Freq: Two times a day (BID) | NASAL | Status: DC
  Administered 2019-04-22 – 2019-04-24 (×3): 2 via NASAL
  Filled 2019-04-22: qty 16

## 2019-04-22 MED ORDER — GUAIFENESIN 100 MG/5ML PO SOLN: 200.0000 mg | Freq: Three times a day (TID) | ORAL | Status: DC | PRN

## 2019-04-22 MED ORDER — DOCUSATE SODIUM 100 MG PO CAPS
100.0000 mg | ORAL_CAPSULE | Freq: Two times a day (BID) | ORAL | Status: DC | PRN
  Filled 2019-04-22: qty 1

## 2019-04-22 MED ORDER — ONDANSETRON HCL 4 MG PO TABS: 4.0000 mg | ORAL_TABLET | Freq: Four times a day (QID) | ORAL | Status: DC | PRN

## 2019-04-22 MED ORDER — HYDRALAZINE HCL 25 MG PO TABS
25.0000 mg | ORAL_TABLET | Freq: Four times a day (QID) | ORAL | Status: DC | PRN
  Administered 2019-04-23: 25 mg via ORAL
  Filled 2019-04-22 (×2): qty 1

## 2019-04-22 MED ORDER — LORATADINE 10 MG PO TABS
10.0000 mg | ORAL_TABLET | Freq: Every day | ORAL | Status: DC
  Administered 2019-04-22 – 2019-04-24 (×3): 10 mg via ORAL
  Filled 2019-04-22 (×3): qty 1

## 2019-04-22 MED ORDER — IPRATROPIUM-ALBUTEROL 0.5-2.5 (3) MG/3ML IN SOLN: 3.0000 mL | Freq: Three times a day (TID) | RESPIRATORY_TRACT | Status: DC

## 2019-04-22 MED ORDER — QUETIAPINE FUMARATE 100 MG PO TABS
100.0000 mg | ORAL_TABLET | Freq: Two times a day (BID) | ORAL | Status: DC
  Administered 2019-04-22 – 2019-04-24 (×6): 100 mg via ORAL
  Filled 2019-04-22 (×6): qty 1

## 2019-04-22 MED ORDER — BENZONATATE 100 MG PO CAPS: 100.0000 mg | ORAL_CAPSULE | Freq: Three times a day (TID) | ORAL | Status: DC | PRN

## 2019-04-22 MED ORDER — VITAMIN C 500 MG PO TABS
500.0000 mg | ORAL_TABLET | Freq: Two times a day (BID) | ORAL | Status: DC
  Administered 2019-04-22 – 2019-04-24 (×6): 500 mg via ORAL
  Filled 2019-04-22 (×6): qty 1

## 2019-04-22 MED ORDER — ACETAMINOPHEN 650 MG RE SUPP: 650.0000 mg | Freq: Four times a day (QID) | RECTAL | Status: DC | PRN

## 2019-04-22 MED ORDER — PANTOPRAZOLE SODIUM 40 MG PO TBEC
40.0000 mg | DELAYED_RELEASE_TABLET | Freq: Every day | ORAL | Status: DC
  Administered 2019-04-22 – 2019-04-24 (×3): 40 mg via ORAL
  Filled 2019-04-22 (×3): qty 1

## 2019-04-22 MED ORDER — BUPROPION HCL ER (XL) 150 MG PO TB24
150.0000 mg | ORAL_TABLET | Freq: Every day | ORAL | Status: DC
  Administered 2019-04-22 – 2019-04-24 (×3): 150 mg via ORAL
  Filled 2019-04-22 (×3): qty 1

## 2019-04-22 MED ORDER — LABETALOL HCL 5 MG/ML IV SOLN
0.5000 mg/min | INTRAVENOUS | Status: DC
  Administered 2019-04-22: 0.5 mg/min via INTRAVENOUS
  Administered 2019-04-22: 2 mg/min via INTRAVENOUS
  Filled 2019-04-22 (×4): qty 100

## 2019-04-22 MED ORDER — METHOCARBAMOL 500 MG PO TABS
500.0000 mg | ORAL_TABLET | Freq: Every day | ORAL | Status: DC
  Administered 2019-04-22 – 2019-04-24 (×3): 500 mg via ORAL
  Filled 2019-04-22 (×3): qty 1

## 2019-04-22 MED ORDER — VALPROIC ACID 250 MG PO CAPS
500.0000 mg | ORAL_CAPSULE | Freq: Two times a day (BID) | ORAL | Status: DC
  Administered 2019-04-22 – 2019-04-24 (×6): 500 mg via ORAL
  Filled 2019-04-22 (×7): qty 2

## 2019-04-22 MED ORDER — ONDANSETRON HCL 4 MG/2ML IJ SOLN: 4.0000 mg | Freq: Four times a day (QID) | INTRAMUSCULAR | Status: DC | PRN

## 2019-04-22 MED ORDER — METHOCARBAMOL 500 MG PO TABS: 500.0000 mg | ORAL_TABLET | Freq: Three times a day (TID) | ORAL | Status: DC | PRN

## 2019-04-22 NOTE — Progress Notes (Addendum)
PROGRESS NOTE    Natalie Thomas  [id number] DOB: Aug 12, 1960 DOA: 04/21/2019 PCP: System, Pcp Not In    Brief Narrative:   Natalie Thomas is a 58 y.o. female with medical history significant of hypertension, COPD, diabetes, anxiety with depression, degenerative disc disease GERD who presented to the ER with persistent nausea vomiting and abdominal pain.  She has history of diabetes with schizoaffective disorder.  She has some abdominal pain.  No persistent diarrhea.  No fever or chills no contact with anybody with COVID-19.  She lives in the skilled facility where the staff noted the nausea vomiting and increasing confusion only this morning.  She was previously on Valium but switch to Xanax and that has triggered some changes in her.  She was noted to have hypertensive urgency in addition to sodium of 121.  She does not appear to be dehydrated.  Her BUN is 14.  Patient is being admitted to the hospital for evaluation of hyponatremia but also hypertensive urgency..  ED Course: Temperature is 98.2 blood pressure 236/138 pulse 88 respirate of 19 oxygen sats 91% room air.  White count 9.9 hemoglobin 15.3 and platelets 205.  Sodium is 121 potassium 4.0 chloride 86 CO2 24 BUN 14 creatinine 0.51 calcium 9.1 glucose 157.  CT abdomen pelvis showed mainly umbilical hernia no acute findings.  Acute abdominal series also showed no significant findings.  Patient is being admitted for further evaluation   Assessment & Plan:   Principal Problem:   Malignant hypertensive urgency Active Problems:   DM (diabetes mellitus), type 2 (HCC)   Schizoaffective disorder (HCC)   GERD   Acute hyponatremia   Chronic respiratory failure with hypoxia (HCC)   Type 2 diabetes mellitus without complication (HCC)   Chronic diastolic CHF (congestive heart failure) (HCC)   Malignant hypertensive urgency Blood pressure on presentation 253/83.  Home regimen includes metoprolol tartrate 25 mg p.o. twice daily,  nifedipine 90 mg p.o. daily and furosemide 20 mg p.o. twice daily. --Started on labetalol IV drip; attempt to titrate off today --Continue home metoprolol tartrate 25 mg p.o. twice daily, nifedipine 90 mg p.o. daily --Start lisinopril 20 mg p.o. daily  Acute on chronic hyponatremia Patient with history of hyponatremia with baseline range 125-130.  On several psychiatric medications that could be contributing as well as furosemide and can be related to psychogenic polydipsia versus hypovolemia.  Sodium on presentation 121, with urine sodium 32 and urine osmolality 125. --Na 121-->127 --continue to hold home furosemide --Sodium chloride tablet 1 g TID --Repeat BMP in the a.m.  Type 2 diabetes mellitus Diet controlled.  Hemoglobin A1c 5.2 12/07/2018.  Abdominal pain Patient with history of chronic constipation/GERD.  Acute abdominal series unrevealing.  CT abdomen/pelvis with umbilical herniation of bowel loops without evidence of incarceration/bowel obstruction and no other identified abnormality. --Protonix 40 mg p.o. daily  Hx COPD with chronic hypoxic respiratory failure Patient on home oxygen at 2 L/min via nasal cannula.  Currently on 3 L nasal cannula oxygenating 93%.  This x-ray notable for previous left pleural effusion now resolved with mild left basilar atelectasis no acute findings. --Covid-19/SARS-CoV-2: Pending --Continue home Breo Ellipta inhaler --Continue to titrate supplemental oxygen to maintain SPO2 greater than 88% --Hold neb treatments until Covid test returns back negative --Continue airborne/contact isolation until Covid test returns negative  Schizoaffective disorder with depression and paranoia --Alprazolam 0.5 mg p.o. twice daily --Latuda 40 mg p.o. daily --Seroquel 100 mg p.o. twice daily --Valproic acid 500 mg p.o. twice  daily  Hx diastolic congestive heart failure TTE 12/04/2018 with EF greater than 65%, impaired relaxation, RV mildly enlarged, trivial  circumferential pericardial effusion without evidence of tamponade.  Home regimen includes metoprolol 25 mg p.o. twice daily, Lasix 20 mg p.o. twice daily.   --Continue metoprolol 25 mg p.o. twice daily --Hold Lasix secondary to hyponatremia as above --Monitor strict I's and O's and daily weights  Addendum 1429: Covid-19/SARS-CoV-2 test returned negative.  Will discontinue airborne precautions.  We will continue contact isolation for MRSA + PCR.   DVT prophylaxis: Heparin Code Status: Full code Family Communication: None Disposition Plan: Likely discharge back to her memory care unit, at [facility] when medically ready.   Consultants:   None  Procedures:   None  Antimicrobials:    None   Subjective: Patient seen and examined at bedside, pleasantly confused.  Unable to obtain any further ROS secondary to her schizophrenic disorder and nonsensical speech.  Continues on labetalol IV drip, will add additional antihypertensives to assist with discontinuation of IV drip.  Covid-19/SARS-CoV-2 test still pending.  Will maintain airborne/contact isolation until negative test results.  No other acute concerns per nursing staff overnight.  Objective: Vitals:   04/22/19 1105 04/22/19 1115 04/22/19 1130 04/22/19 1145  BP: (!) 204/55 (!) 154/46 (!) 146/44 (!) 140/43  Pulse: (!) 55 (!) 55 (!) 56   Resp: 15 16 16 15   Temp:    (!) 97.5 F (36.4 C)  TempSrc:    Oral  SpO2: 97%     Weight:      Height:        Intake/Output Summary (Last 24 hours) at 04/22/2019 1304 Last data filed at 04/22/2019 1032 Gross per 24 hour  Intake 2341.82 ml  Output 900 ml  Net 1441.82 ml   Filed Weights   04/22/19 0100  Weight: 82.5 kg    Examination:  General exam: Appears calm and comfortable, pleasantly confused, nonsensical speech Respiratory system: Clear to auscultation. Respiratory effort normal.  On 3 L nasal cannula satting 93% Cardiovascular system: S1 & S2 heard, RRR. No JVD,  murmurs, rubs, gallops or clicks.  Trace to 1+  pedal edema. Gastrointestinal system: Abdomen is nondistended, soft and nontender. No organomegaly or masses felt. Normal bowel sounds heard. Central nervous system: Alert, not oriented. No focal neurological deficits. Extremities: Symmetric 5 x 5 power.  Moves all extremities independently Skin: No rashes, lesions or ulcers Psychiatry: Judgement and insight appear poor, depressed mood/flat affect    Data Reviewed: I have personally reviewed following labs and imaging studies  CBC: Recent Labs  Lab 04/21/19 1746 04/22/19 0149  WBC 9.9 8.5  HGB 15.3* 16.1*  HCT 44.2 47.6*  MCV 82.6 83.8  PLT 204 [id number]   Basic Metabolic Panel: Recent Labs  Lab 04/21/19 1746 04/22/19 0149  NA 121* 127*  K 4.0 3.9  CL 86* 93*  CO2 24 24  GLUCOSE 157* 129*  BUN 14 9  CREATININE 0.51 0.49  CALCIUM 9.0 9.2   GFR: Estimated Creatinine Clearance: 78.9 mL/min (by C-G formula based on SCr of 0.49 mg/dL). Liver Function Tests: Recent Labs  Lab 04/21/19 1746 04/22/19 0149  AST 17 17  ALT 12 15  ALKPHOS 100 102  BILITOT 0.5 0.3  PROT 8.0 7.7  ALBUMIN 3.7 3.7   Recent Labs  Lab 04/21/19 1746  LIPASE 32   No results for input(s): AMMONIA in the last 168 hours. Coagulation Profile: No results for input(s): INR, PROTIME in the last 168  hours. Cardiac Enzymes: No results for input(s): CKTOTAL, CKMB, CKMBINDEX, TROPONINI in the last 168 hours. BNP (last 3 results) No results for input(s): PROBNP in the last 8760 hours. HbA1C: No results for input(s): HGBA1C in the last 72 hours. CBG: No results for input(s): GLUCAP in the last 168 hours. Lipid Profile: No results for input(s): CHOL, HDL, LDLCALC, TRIG, CHOLHDL, LDLDIRECT in the last 72 hours. Thyroid Function Tests: No results for input(s): TSH, T4TOTAL, FREET4, T3FREE, THYROIDAB in the last 72 hours. Anemia Panel: No results for input(s): VITAMINB12, FOLATE, FERRITIN, TIBC, IRON,  RETICCTPCT in the last 72 hours. Sepsis Labs: No results for input(s): PROCALCITON, LATICACIDVEN in the last 168 hours.  Recent Results (from the past 240 hour(s))  MRSA PCR Screening     Status: Abnormal   Collection Time: 04/22/19  2:31 AM   Specimen: Nasal Mucosa; Nasopharyngeal  Result Value Ref Range Status   MRSA by PCR POSITIVE (A) NEGATIVE Final    Comment:        The GeneXpert MRSA Assay (FDA approved for NASAL specimens only), is one component of a comprehensive MRSA colonization surveillance program. It is not intended to diagnose MRSA infection nor to guide or monitor treatment for MRSA infections. RESULT CALLED TO, READ BACK BY AND VERIFIED WITH: Patricia Nettle [id number] @ [id number] [place] Performed at [facility], [place] [address] [place], [place] [address]          Radiology Studies: Ct Abdomen Pelvis W Contrast  Result Date: 04/21/2019 CLINICAL DATA:  Generalized abdomen pain. EXAM: CT ABDOMEN AND PELVIS WITH CONTRAST TECHNIQUE: Multidetector CT imaging of the abdomen and pelvis was performed using the standard protocol following bolus administration of intravenous contrast. CONTRAST:  167mL OMNIPAQUE IOHEXOL 300 MG/ML  SOLN COMPARISON:  March 13, 2019 FINDINGS: Lower chest: Mild atelectasis of posterior lung bases are noted. The heart size is probably enlarged. Hepatobiliary: No focal liver abnormality is seen. Status post cholecystectomy. No biliary dilatation. Pancreas: Unremarkable. No pancreatic ductal dilatation or surrounding inflammatory changes. Spleen: Normal in size without focal abnormality. Adrenals/Urinary Tract: The bilateral adrenal glands are normal. Right kidney cysts are noted. There is no hydronephrosis bilaterally. The bladder is distended. Stomach/Bowel: The stomach is normal. There is umbilical herniation of bowel loops without evidence of incarceration or bowel obstruction. There is no diverticulitis. The appendix is  not seen but no inflammation is noted around cecum. Vascular/Lymphatic: Aortic atherosclerosis. No enlarged abdominal or pelvic lymph nodes. Reproductive: The uterus is not seen. No abnormal mass is identified in the bilateral adnexa. Other: None. Musculoskeletal: Degenerative joint changes of the spine are identified. Chronic compression deformities of several vertebral bodies are unchanged. IMPRESSION: Umbilical herniation of bowel loops without evidence of incarceration or bowel obstruction. No acute abnormality is identified in the abdomen and pelvis. Electronically Signed   By: Abelardo Diesel M.D.   On: 04/21/2019 21:24   Dg Abdomen Acute W/chest  Result Date: 04/21/2019 CLINICAL DATA:  Nausea and vomiting EXAM: DG ABDOMEN ACUTE W/ 1V CHEST COMPARISON:  03/13/2019 FINDINGS: Cardiac shadow is enlarged in size. Aortic calcifications are noted. Lungs are well aerated bilaterally. Previously seen left pleural effusion has resolved in the interval. Minimal left basilar atelectasis is seen. Scattered large and small bowel gas is noted. No definitive obstructive changes are seen. Changes of prior sacroplasty are noted. Surgical repair of the proximal right femur is noted as well. No free air is seen. IMPRESSION: Minimal left basilar atelectasis. No acute abnormality noted  in the abdomen. Electronically Signed   By: Inez Catalina M.D.   On: 04/21/2019 19:48        Scheduled Meds:  ALPRAZolam  0.5 mg Oral BID   aspirin EC  81 mg Oral Daily   buPROPion  150 mg Oral Daily   Chlorhexidine Gluconate Cloth  6 each Topical Daily   docusate sodium  100 mg Oral BID   feeding supplement (ENSURE ENLIVE)  237 mL Oral Daily   feeding supplement (PRO-STAT SUGAR FREE 64)  30 mL Oral BID   fluticasone  2 spray Each Nare BID   fluticasone furoate-vilanterol  1 puff Inhalation Daily   gabapentin  100 mg Oral Daily   guaiFENesin  1,200 mg Oral Daily   heparin  5,000 Units Subcutaneous Q8H   hydrOXYzine   25 mg Oral TID   ipratropium-albuterol  3 mL Nebulization TID   lisinopril  20 mg Oral Daily   loratadine  10 mg Oral Daily   lurasidone  40 mg Oral Q breakfast   mouth rinse  15 mL Mouth Rinse BID   Melatonin  5 mg Oral QHS   methocarbamol  500 mg Oral Daily   metoprolol tartrate  25 mg Oral BID   multivitamin with minerals  1 tablet Oral Daily   NIFEdipine  90 mg Oral Daily   pantoprazole  40 mg Oral Daily   polyethylene glycol  17 g Oral Daily   QUEtiapine  100 mg Oral BID   sodium chloride  1 g Oral TID WC   valproic acid  500 mg Oral BID   vitamin C  500 mg Oral BID   zinc sulfate  220 mg Oral Daily   Continuous Infusions:  sodium chloride 125 mL/hr at 04/22/19 1032   labetalol (NORMODYNE) infusion Stopped (04/22/19 1024)     LOS: 1 day    Time spent: 38 minutes spent on chart review, discussion with nursing staff, consultants, updating family and interview/physical exam; more than 50% of that time was spent in counseling and/or coordination of care.    Orlondo Holycross J [place] DO [facility] 04/22/2019, 1:04 PM

## 2019-04-22 NOTE — Progress Notes (Signed)
CRITICAL VALUE ALERT  Critical Value:  MRSA + in nares  Date & Time Notied: 04/22/2019, 1245  Provider Notified: [place] MD  Orders Received/Actions taken: standing positive MRSA orders placed

## 2019-04-22 NOTE — Plan of Care (Signed)
  Problem: Education: Goal: Knowledge of General Education information will improve Description Including pain rating scale, medication(s)/side effects and non-pharmacologic comfort measures Outcome: Progressing   

## 2019-04-22 NOTE — ED Notes (Signed)
ED TO INPATIENT HANDOFF REPORT  ED Nurse Name and Phone #: Lorre Nick, RN  S Name/Age/Gender Natalie Thomas 58 y.o. female Room/Bed: WA01/WA01  Code Status   Code Status: Prior  Home/SNF/Other Nursing Home Patient oriented to: self Is this baseline? No   Triage Complete: Triage complete  Chief Complaint Altered Mental Status; Nausea; Vomiting  Triage Note 58 yo female bib ems from [facility]. Staff reports nausea vomiting and increased confusion since this morning. Staff states increased behavioral episodes since pt was switched from valium to xanax. Pt is AOx2 as per EMS. Pt has history of scitzoaffective disorder. PT has one episode of emesis in route. Pt has had 4mg  of zofran. 20g L forearm  Vitals: 151/103 cbg 164 spo2 99 on 3 liters (pt is on 3 L normally) Hr 77 reg rr 20 reg   Allergies Allergies  Allergen Reactions  . Clarithromycin Itching  . Penicillins Itching    Has tolerated cefazolin before  Has patient had a PCN reaction causing immediate rash, facial/tongue/throat swelling, SOB or lightheadedness with hypotension: No Has patient had a PCN reaction causing severe rash involving mucus membranes or skin necrosis: No Has patient had a PCN reaction that required hospitalization: Unknown Has patient had a PCN reaction occurring within the last 10 years: No If all of the above answers are "NO", then may proceed with Cephalosporin use.     Level of Care/Admitting Diagnosis ED Disposition    ED Disposition Condition [facility] Area: [place] [[id number]]  Level of Care: Stepdown [14]  Admit to SDU based on following criteria: Hemodynamic compromise or significant risk of instability:  Patient requiring short term acute titration and management of vasoactive drips, and invasive monitoring (i.e., CVP and Arterial line).  Covid Evaluation: Asymptomatic Screening Protocol (No Symptoms)  Diagnosis: Malignant hypertensive  urgency [id number]  Admitting Physician: Elwyn Reach [[id number]]  Attending Physician: Elwyn Reach [[id number]]  Estimated length of stay: past midnight tomorrow  Certification:: I certify this patient will need inpatient services for at least 2 midnights  PT Class (Do Not Modify): Inpatient [101]  PT Acc Code (Do Not Modify): Private [1]       B Medical/Surgery History Past Medical History:  Diagnosis Date  . Acute on chronic respiratory failure with hypoxia and hypercapnia ([place]) 10/07/2017  . Anemia 10/16/2017  . Anxiety and depression   . CHF (congestive heart failure) ([place])   . Chronic hyponatremia 04/08/2007   Qualifier: Diagnosis of  By: Marca Ancona RMA, Jaydence Vanyo    . Chronic respiratory failure with hypoxia ([place]) 10/07/2017  . Closed comminuted intertrochanteric fracture of right femur ([place]) 11/07/2017  . COPD 04/08/2007   Qualifier: Diagnosis of  By: Marca Ancona RMA, Exander Shaul    . Cough 05/08/2011  . Depression   . Diabetes mellitus, type 2 ([place])    pt denies but states that she has been treated for DM  . Dyspnea 10/16/2017  . Dysuria 04/08/2007   Qualifier: Diagnosis of  By: Reatha Armour, Eraina Winnie    . Essential hypertension 04/08/2007   Qualifier: Diagnosis of  By: Marca Ancona RMA, Shaquinta Peruski    . GERD (gastroesophageal reflux disease)   . HIP PAIN, LEFT 12/12/2007   Qualifier: Diagnosis of  By: Loanne Drilling MD, Jacelyn Pi   . Hyperlipidemia 10/07/2017  . Hypertension   . Hyponatremia 07/27/2011  . Incontinence of urine 08/14/2011  . Lumbar disc disease 04/09/2011  . MENOPAUSE, PREMATURE 04/08/2007   Qualifier: Diagnosis of  By: Larose Kells    . NEOPLASM, MALIGNANT, VULVA 04/08/2007   Qualifier: Diagnosis of  By: Marca Ancona RMA, Omer Puccinelli    . Osteoporosis   . Osteoporosis 04/08/2007   Qualifier: Diagnosis of  By: Reatha Armour, Solstice Lastinger    . Polycythemia secondary to smoking 04/09/2011  . S/P right hip fracture 11/07/2017  . Schizoaffective disorder, bipolar type ([place]) 07/05/2011   Past Surgical History:  Procedure Laterality  Date  . EYE SURGERY     on left  eye, pt states that she sees bad out of the right eye and needs surgery there  . INTRAMEDULLARY (IM) NAIL INTERTROCHANTERIC Right 11/13/2017   Procedure: INTRAMEDULLARY (IM) NAIL INTERTROCHANTRIC;  Surgeon: Shona Needles, MD;  Location: [facility];  Service: Orthopedics;  Laterality: Right;  . PELVIC FRACTURE SURGERY       A IV Location/Drains/Wounds Patient Lines/Drains/Airways Status   Active Line/Drains/Airways    Name:   Placement date:   Placement time:   Site:   Days:   Peripheral IV 04/21/19 Anterior;Left Forearm   04/21/19    1737    Forearm   1   Peripheral IV 04/21/19 Right Hand   04/21/19    1840    Hand   1   External Urinary Catheter   03/10/19    1500    -   43   Wound / Incision (Open or Dehisced) 12/09/18 Other (Comment) Labia Left;Medial 0.5 x 0.5 denuded area    12/09/18    1703    Labia   134          Intake/Output Last 24 hours  Intake/Output Summary (Last 24 hours) at 04/22/2019 0004 Last data filed at 04/21/2019 2359 Gross per 24 hour  Intake 1000 ml  Output -  Net 1000 ml    Labs/Imaging Results for orders placed or performed during the hospital encounter of 04/21/19 (from the past 48 hour(s))  CBC     Status: Abnormal   Collection Time: 04/21/19  5:46 PM  Result Value Ref Range   WBC 9.9 4.0 - 10.5 K/uL   RBC 5.35 (H) 3.87 - 5.11 MIL/uL   Hemoglobin 15.3 (H) 12.0 - 15.0 g/dL   HCT 44.2 36.0 - 46.0 %   MCV 82.6 80.0 - 100.0 fL   MCH 28.6 26.0 - 34.0 pg   MCHC 34.6 30.0 - 36.0 g/dL   RDW 13.0 11.5 - 15.5 %   Platelets 204 150 - 400 K/uL   nRBC 0.0 0.0 - 0.2 %    Comment: Performed at [facility], [place] [address], [place], [place] [address]  Comprehensive metabolic panel     Status: Abnormal   Collection Time: 04/21/19  5:46 PM  Result Value Ref Range   Sodium 121 (L) 135 - 145 mmol/L   Potassium 4.0 3.5 - 5.1 mmol/L   Chloride 86 (L) 98 - 111 mmol/L   CO2 24 22 - 32 mmol/L   Glucose, Bld 157  (H) 70 - 99 mg/dL   BUN 14 6 - 20 mg/dL   Creatinine, Ser 0.51 0.44 - 1.00 mg/dL   Calcium 9.0 8.9 - 10.3 mg/dL   Total Protein 8.0 6.5 - 8.1 g/dL   Albumin 3.7 3.5 - 5.0 g/dL   AST 17 15 - 41 U/L   ALT 12 0 - 44 U/L   Alkaline Phosphatase 100 38 - 126 U/L   Total Bilirubin 0.5 0.3 - 1.2 mg/dL   GFR calc non  Af Amer >60 >60 mL/min   GFR calc Af Amer >60 >60 mL/min   Anion gap 11 5 - 15    Comment: Performed at [facility], [place] [address], [place], [place] [address]  Lipase, blood     Status: None   Collection Time: 04/21/19  5:46 PM  Result Value Ref Range   Lipase 32 11 - 51 U/L    Comment: Performed at [facility], [place] [address], [place], [place] [address]  Urinalysis, Routine w reflex microscopic     Status: Abnormal   Collection Time: 04/21/19  5:46 PM  Result Value Ref Range   Color, Urine STRAW (A) YELLOW   APPearance CLEAR CLEAR   Specific Gravity, Urine 1.003 (L) 1.005 - 1.030   pH 8.0 5.0 - 8.0   Glucose, UA NEGATIVE NEGATIVE mg/dL   Hgb urine dipstick SMALL (A) NEGATIVE   Bilirubin Urine NEGATIVE NEGATIVE   Ketones, ur NEGATIVE NEGATIVE mg/dL   Protein, ur NEGATIVE NEGATIVE mg/dL   Nitrite NEGATIVE NEGATIVE   Leukocytes,Ua NEGATIVE NEGATIVE   RBC / HPF 0-5 0 - 5 RBC/hpf   Bacteria, UA RARE (A) NONE SEEN   Squamous Epithelial / LPF 0-5 0 - 5    Comment: Performed at [facility], [place] [address] [place], [place] [address]   Ct Abdomen Pelvis W Contrast  Result Date: 04/21/2019 CLINICAL DATA:  Generalized abdomen pain. EXAM: CT ABDOMEN AND PELVIS WITH CONTRAST TECHNIQUE: Multidetector CT imaging of the abdomen and pelvis was performed using the standard protocol following bolus administration of intravenous contrast. CONTRAST:  114mL OMNIPAQUE IOHEXOL 300 MG/ML  SOLN COMPARISON:  March 13, 2019 FINDINGS: Lower chest: Mild atelectasis of posterior lung bases are noted. The heart size is probably enlarged.  Hepatobiliary: No focal liver abnormality is seen. Status post cholecystectomy. No biliary dilatation. Pancreas: Unremarkable. No pancreatic ductal dilatation or surrounding inflammatory changes. Spleen: Normal in size without focal abnormality. Adrenals/Urinary Tract: The bilateral adrenal glands are normal. Right kidney cysts are noted. There is no hydronephrosis bilaterally. The bladder is distended. Stomach/Bowel: The stomach is normal. There is umbilical herniation of bowel loops without evidence of incarceration or bowel obstruction. There is no diverticulitis. The appendix is not seen but no inflammation is noted around cecum. Vascular/Lymphatic: Aortic atherosclerosis. No enlarged abdominal or pelvic lymph nodes. Reproductive: The uterus is not seen. No abnormal mass is identified in the bilateral adnexa. Other: None. Musculoskeletal: Degenerative joint changes of the spine are identified. Chronic compression deformities of several vertebral bodies are unchanged. IMPRESSION: Umbilical herniation of bowel loops without evidence of incarceration or bowel obstruction. No acute abnormality is identified in the abdomen and pelvis. Electronically Signed   By: Abelardo Diesel M.D.   On: 04/21/2019 21:24   Dg Abdomen Acute W/chest  Result Date: 04/21/2019 CLINICAL DATA:  Nausea and vomiting EXAM: DG ABDOMEN ACUTE W/ 1V CHEST COMPARISON:  03/13/2019 FINDINGS: Cardiac shadow is enlarged in size. Aortic calcifications are noted. Lungs are well aerated bilaterally. Previously seen left pleural effusion has resolved in the interval. Minimal left basilar atelectasis is seen. Scattered large and small bowel gas is noted. No definitive obstructive changes are seen. Changes of prior sacroplasty are noted. Surgical repair of the proximal right femur is noted as well. No free air is seen. IMPRESSION: Minimal left basilar atelectasis. No acute abnormality noted in the abdomen. Electronically Signed   By: Inez Catalina M.D.    On: 04/21/2019 19:48  Pending Labs [employer] (From admission, onward)    Start     Ordered   04/21/19 2216  SARS CORONAVIRUS 2 (TAT 6-24 HRS) Nasopharyngeal Urine, Clean Catch  (Asymptomatic/Tier 2 Patients Labs)  Once,   STAT    Question Answer Comment  Is this test for diagnosis or screening Screening   Symptomatic for COVID-19 as defined by CDC No   Hospitalized for COVID-19 No   Admitted to ICU for COVID-19 No   Previously tested for COVID-19 Yes   Resident in a congregate (group) care setting No   Employed in healthcare setting No   Pregnant No      04/21/19 2215   Signed and Held  CBC  (heparin)  Once,   R    Comments: Baseline for heparin therapy IF NOT ALREADY DRAWN.  Notify MD if PLT < 100 K.    Signed and Held   Signed and Held  Creatinine, serum  (heparin)  Once,   R    Comments: Baseline for heparin therapy IF NOT ALREADY DRAWN.    Signed and Held   Signed and Held  Comprehensive metabolic panel  Tomorrow morning,   R     Signed and Held   Signed and Held  CBC  Tomorrow morning,   R     Signed and Held   Signed and Held  Sodium, urine, random  Once,   R     Signed and Held   Signed and Held  Osmolality  Once,   R     Signed and Held   Signed and Held  Osmolality, urine  Once,   R     Signed and Held          Vitals/Pain Today's Vitals   04/21/19 1800 04/21/19 2029 04/21/19 2238  BP: (!) 204/92 (!) 236/138 (!) 208/168  Pulse: 74 88 88  Resp: 19 18 (!) 22  Temp:   (!) 97.5 F (36.4 C)  TempSrc:   Oral  SpO2: 99% 91% 100%    Isolation Precautions No active isolations  Medications Medications  sodium chloride 0.9 % bolus 1,000 mL (0 mLs Intravenous Stopped 04/21/19 2359)    Followed by  0.9 %  sodium chloride infusion (has no administration in time range)  sodium chloride (PF) 0.9 % injection (has no administration in time range)  ondansetron (ZOFRAN) injection 4 mg (4 mg Intravenous Given 04/21/19 2021)  morphine 4 MG/ML injection 4 mg  (4 mg Intravenous Given 04/21/19 2021)  iohexol (OMNIPAQUE) 300 MG/ML solution 100 mL (100 mLs Intravenous Contrast Given 04/21/19 2034)  labetalol (NORMODYNE) injection 20 mg (20 mg Intravenous Given 04/21/19 2243)    Mobility non-ambulatory     Focused Assessments Cardiac Assessment Handoff:    Lab Results  Component Value Date   CKTOTAL 40 06/22/2012   CKMB 0.8 06/22/2012   TROPONINI <0.03 12/03/2018   Lab Results  Component Value Date   DDIMER 0.55 (H) 06/09/2018   Does the Patient currently have chest pain? No  , Neuro Assessment Handoff:  Swallow screen pass? Did not assess         Neuro Assessment:   Neuro Checks:      Last Documented NIHSS Modified Score:   Has TPA been given? No If patient is a Neuro Trauma and patient is going to OR before floor call report to [place] nurse: [phone] or [phone]     R Recommendations: See Admitting Provider Note  Report given to:  Additional Notes:    ED TO INPATIENT HANDOFF REPORT  ED Nurse Name and Phone #: Lorre Nick, RN  S Name/Age/Gender Natalie Thomas 58 y.o. female Room/Bed: WA01/WA01  Code Status   Code Status: Prior  Home/SNF/Other Nursing Home Patient oriented to: self Is this baseline? No   Triage Complete: Triage complete  Chief Complaint Altered Mental Status; Nausea; Vomiting  Triage Note 58 yo female bib ems from [facility]. Staff reports nausea vomiting and increased confusion since this morning. Staff states increased behavioral episodes since pt was switched from valium to xanax. Pt is AOx2 as per EMS. Pt has history of scitzoaffective disorder. PT has one episode of emesis in route. Pt has had 4mg  of zofran. 20g L forearm  Vitals: 151/103 cbg 164 spo2 99 on 3 liters (pt is on 3 L normally) Hr 77 reg rr 20 reg   Allergies Allergies  Allergen Reactions  . Clarithromycin Itching  . Penicillins Itching    Has tolerated cefazolin before  Has patient had a PCN  reaction causing immediate rash, facial/tongue/throat swelling, SOB or lightheadedness with hypotension: No Has patient had a PCN reaction causing severe rash involving mucus membranes or skin necrosis: No Has patient had a PCN reaction that required hospitalization: Unknown Has patient had a PCN reaction occurring within the last 10 years: No If all of the above answers are "NO", then may proceed with Cephalosporin use.     Level of Care/Admitting Diagnosis ED Disposition    ED Disposition Condition [facility]: [place] [[id number]]  Level of Care: Stepdown [14]  Admit to SDU based on following criteria: Hemodynamic compromise or significant risk of instability:  Patient requiring short term acute titration and management of vasoactive drips, and invasive monitoring (i.e., CVP and Arterial line).  Covid Evaluation: Asymptomatic Screening Protocol (No Symptoms)  Diagnosis: Malignant hypertensive urgency [id number]  Admitting Physician: Elwyn Reach [[id number]]  Attending Physician: Elwyn Reach [[id number]]  Estimated length of stay: past midnight tomorrow  Certification:: I certify this patient will need inpatient services for at least 2 midnights  PT Class (Do Not Modify): Inpatient [101]  PT Acc Code (Do Not Modify): Private [1]       B Medical/Surgery History Past Medical History:  Diagnosis Date  . Acute on chronic respiratory failure with hypoxia and hypercapnia ([place]) 10/07/2017  . Anemia 10/16/2017  . Anxiety and depression   . CHF (congestive heart failure) ([place])   . Chronic hyponatremia 04/08/2007   Qualifier: Diagnosis of  By: Marca Ancona RMA, Gayl Ivanoff    . Chronic respiratory failure with hypoxia ([place]) 10/07/2017  . Closed comminuted intertrochanteric fracture of right femur ([place]) 11/07/2017  . COPD 04/08/2007   Qualifier: Diagnosis of  By: Marca Ancona RMA, Adira Limburg    . Cough 05/08/2011  . Depression   . Diabetes mellitus, type 2 ([place])    pt denies  but states that she has been treated for DM  . Dyspnea 10/16/2017  . Dysuria 04/08/2007   Qualifier: Diagnosis of  By: Reatha Armour, Trinita Devlin    . Essential hypertension 04/08/2007   Qualifier: Diagnosis of  By: Marca Ancona RMA, Gracia Saggese    . GERD (gastroesophageal reflux disease)   . HIP PAIN, LEFT 12/12/2007   Qualifier: Diagnosis of  By: Loanne Drilling MD, Jacelyn Pi   . Hyperlipidemia 10/07/2017  . Hypertension   . Hyponatremia 07/27/2011  . Incontinence of urine 08/14/2011  . Lumbar disc disease 04/09/2011  . MENOPAUSE, PREMATURE 04/08/2007  Qualifier: Diagnosis of  By: Reatha Armour, Lorre Nick    . NEOPLASM, MALIGNANT, VULVA 04/08/2007   Qualifier: Diagnosis of  By: Marca Ancona RMA, Saidi Santacroce    . Osteoporosis   . Osteoporosis 04/08/2007   Qualifier: Diagnosis of  By: Reatha Armour, Brance Dartt    . Polycythemia secondary to smoking 04/09/2011  . S/P right hip fracture 11/07/2017  . Schizoaffective disorder, bipolar type ([place]) 07/05/2011   Past Surgical History:  Procedure Laterality Date  . EYE SURGERY     on left  eye, pt states that she sees bad out of the right eye and needs surgery there  . INTRAMEDULLARY (IM) NAIL INTERTROCHANTERIC Right 11/13/2017   Procedure: INTRAMEDULLARY (IM) NAIL INTERTROCHANTRIC;  Surgeon: Shona Needles, MD;  Location: [facility];  Service: Orthopedics;  Laterality: Right;  . PELVIC FRACTURE SURGERY       A IV Location/Drains/Wounds Patient Lines/Drains/Airways Status   Active Line/Drains/Airways    Name:   Placement date:   Placement time:   Site:   Days:   Peripheral IV 04/21/19 Anterior;Left Forearm   04/21/19    1737    Forearm   1   Peripheral IV 04/21/19 Right Hand   04/21/19    1840    Hand   1   External Urinary Catheter   03/10/19    1500    -   43   Wound / Incision (Open or Dehisced) 12/09/18 Other (Comment) Labia Left;Medial 0.5 x 0.5 denuded area    12/09/18    1703    Labia   134          Intake/Output Last 24 hours  Intake/Output Summary (Last 24 hours) at 04/22/2019 0004 Last data  filed at 04/21/2019 2359 Gross per 24 hour  Intake 1000 ml  Output -  Net 1000 ml    Labs/Imaging Results for orders placed or performed during the hospital encounter of 04/21/19 (from the past 48 hour(s))  CBC     Status: Abnormal   Collection Time: 04/21/19  5:46 PM  Result Value Ref Range   WBC 9.9 4.0 - 10.5 K/uL   RBC 5.35 (H) 3.87 - 5.11 MIL/uL   Hemoglobin 15.3 (H) 12.0 - 15.0 g/dL   HCT 44.2 36.0 - 46.0 %   MCV 82.6 80.0 - 100.0 fL   MCH 28.6 26.0 - 34.0 pg   MCHC 34.6 30.0 - 36.0 g/dL   RDW 13.0 11.5 - 15.5 %   Platelets 204 150 - 400 K/uL   nRBC 0.0 0.0 - 0.2 %    Comment: Performed at [facility], [place] [address] [place], [place] [address]  Comprehensive metabolic panel     Status: Abnormal   Collection Time: 04/21/19  5:46 PM  Result Value Ref Range   Sodium 121 (L) 135 - 145 mmol/L   Potassium 4.0 3.5 - 5.1 mmol/L   Chloride 86 (L) 98 - 111 mmol/L   CO2 24 22 - 32 mmol/L   Glucose, Bld 157 (H) 70 - 99 mg/dL   BUN 14 6 - 20 mg/dL   Creatinine, Ser 0.51 0.44 - 1.00 mg/dL   Calcium 9.0 8.9 - 10.3 mg/dL   Total Protein 8.0 6.5 - 8.1 g/dL   Albumin 3.7 3.5 - 5.0 g/dL   AST 17 15 - 41 U/L   ALT 12 0 - 44 U/L   Alkaline Phosphatase 100 38 - 126 U/L   Total Bilirubin 0.5 0.3 - 1.2 mg/dL  GFR calc non Af Amer >60 >60 mL/min   GFR calc Af Amer >60 >60 mL/min   Anion gap 11 5 - 15    Comment: Performed at [facility], [place] [address], [place], [place] [address]  Lipase, blood     Status: None   Collection Time: 04/21/19  5:46 PM  Result Value Ref Range   Lipase 32 11 - 51 U/L    Comment: Performed at [facility], [place] [address], [place], [place] [address]  Urinalysis, Routine w reflex microscopic     Status: Abnormal   Collection Time: 04/21/19  5:46 PM  Result Value Ref Range   Color, Urine STRAW (A) YELLOW   APPearance CLEAR CLEAR   Specific Gravity, Urine 1.003 (L) 1.005 - 1.030   pH 8.0 5.0 - 8.0    Glucose, UA NEGATIVE NEGATIVE mg/dL   Hgb urine dipstick SMALL (A) NEGATIVE   Bilirubin Urine NEGATIVE NEGATIVE   Ketones, ur NEGATIVE NEGATIVE mg/dL   Protein, ur NEGATIVE NEGATIVE mg/dL   Nitrite NEGATIVE NEGATIVE   Leukocytes,Ua NEGATIVE NEGATIVE   RBC / HPF 0-5 0 - 5 RBC/hpf   Bacteria, UA RARE (A) NONE SEEN   Squamous Epithelial / LPF 0-5 0 - 5    Comment: Performed at [facility], [place] [address], [place], [place] [address]   Ct Abdomen Pelvis W Contrast  Result Date: 04/21/2019 CLINICAL DATA:  Generalized abdomen pain. EXAM: CT ABDOMEN AND PELVIS WITH CONTRAST TECHNIQUE: Multidetector CT imaging of the abdomen and pelvis was performed using the standard protocol following bolus administration of intravenous contrast. CONTRAST:  127mL OMNIPAQUE IOHEXOL 300 MG/ML  SOLN COMPARISON:  March 13, 2019 FINDINGS: Lower chest: Mild atelectasis of posterior lung bases are noted. The heart size is probably enlarged. Hepatobiliary: No focal liver abnormality is seen. Status post cholecystectomy. No biliary dilatation. Pancreas: Unremarkable. No pancreatic ductal dilatation or surrounding inflammatory changes. Spleen: Normal in size without focal abnormality. Adrenals/Urinary Tract: The bilateral adrenal glands are normal. Right kidney cysts are noted. There is no hydronephrosis bilaterally. The bladder is distended. Stomach/Bowel: The stomach is normal. There is umbilical herniation of bowel loops without evidence of incarceration or bowel obstruction. There is no diverticulitis. The appendix is not seen but no inflammation is noted around cecum. Vascular/Lymphatic: Aortic atherosclerosis. No enlarged abdominal or pelvic lymph nodes. Reproductive: The uterus is not seen. No abnormal mass is identified in the bilateral adnexa. Other: None. Musculoskeletal: Degenerative joint changes of the spine are identified. Chronic compression deformities of several vertebral bodies are  unchanged. IMPRESSION: Umbilical herniation of bowel loops without evidence of incarceration or bowel obstruction. No acute abnormality is identified in the abdomen and pelvis. Electronically Signed   By: Abelardo Diesel M.D.   On: 04/21/2019 21:24   Dg Abdomen Acute W/chest  Result Date: 04/21/2019 CLINICAL DATA:  Nausea and vomiting EXAM: DG ABDOMEN ACUTE W/ 1V CHEST COMPARISON:  03/13/2019 FINDINGS: Cardiac shadow is enlarged in size. Aortic calcifications are noted. Lungs are well aerated bilaterally. Previously seen left pleural effusion has resolved in the interval. Minimal left basilar atelectasis is seen. Scattered large and small bowel gas is noted. No definitive obstructive changes are seen. Changes of prior sacroplasty are noted. Surgical repair of the proximal right femur is noted as well. No free air is seen. IMPRESSION: Minimal left basilar atelectasis. No acute abnormality noted in the abdomen. Electronically Signed   By: Inez Catalina M.D.   On: 04/21/2019 19:48  Pending Labs [employer] (From admission, onward)    Start     Ordered   04/21/19 2216  SARS CORONAVIRUS 2 (TAT 6-24 HRS) Nasopharyngeal Urine, Clean Catch  (Asymptomatic/Tier 2 Patients Labs)  Once,   STAT    Question Answer Comment  Is this test for diagnosis or screening Screening   Symptomatic for COVID-19 as defined by CDC No   Hospitalized for COVID-19 No   Admitted to ICU for COVID-19 No   Previously tested for COVID-19 Yes   Resident in a congregate (group) care setting No   Employed in healthcare setting No   Pregnant No      04/21/19 2215   Signed and Held  CBC  (heparin)  Once,   R    Comments: Baseline for heparin therapy IF NOT ALREADY DRAWN.  Notify MD if PLT < 100 K.    Signed and Held   Signed and Held  Creatinine, serum  (heparin)  Once,   R    Comments: Baseline for heparin therapy IF NOT ALREADY DRAWN.    Signed and Held   Signed and Held  Comprehensive metabolic panel  Tomorrow morning,    R     Signed and Held   Signed and Held  CBC  Tomorrow morning,   R     Signed and Held   Signed and Held  Sodium, urine, random  Once,   R     Signed and Held   Signed and Held  Osmolality  Once,   R     Signed and Held   Signed and Held  Osmolality, urine  Once,   R     Signed and Held          Vitals/Pain Today's Vitals   04/21/19 1800 04/21/19 2029 04/21/19 2238  BP: (!) 204/92 (!) 236/138 (!) 208/168  Pulse: 74 88 88  Resp: 19 18 (!) 22  Temp:   (!) 97.5 F (36.4 C)  TempSrc:   Oral  SpO2: 99% 91% 100%    Isolation Precautions No active isolations  Medications Medications  sodium chloride 0.9 % bolus 1,000 mL (0 mLs Intravenous Stopped 04/21/19 2359)    Followed by  0.9 %  sodium chloride infusion (has no administration in time range)  sodium chloride (PF) 0.9 % injection (has no administration in time range)  ondansetron (ZOFRAN) injection 4 mg (4 mg Intravenous Given 04/21/19 2021)  morphine 4 MG/ML injection 4 mg (4 mg Intravenous Given 04/21/19 2021)  iohexol (OMNIPAQUE) 300 MG/ML solution 100 mL (100 mLs Intravenous Contrast Given 04/21/19 2034)  labetalol (NORMODYNE) injection 20 mg (20 mg Intravenous Given 04/21/19 2243)    Mobility non-ambulatory     Focused Assessments Cardiac Assessment Handoff:    Lab Results  Component Value Date   CKTOTAL 40 06/22/2012   CKMB 0.8 06/22/2012   TROPONINI <0.03 12/03/2018   Lab Results  Component Value Date   DDIMER 0.55 (H) 06/09/2018   Does the Patient currently have chest pain? No  , Neuro Assessment Handoff:  Swallow screen pass? Not assessed         Neuro Assessment:   Neuro Checks:      Last Documented NIHSS Modified Score:   Has TPA been given? No If patient is a Neuro Trauma and patient is going to OR before floor call report to [place] nurse: [phone] or [phone]     R Recommendations: See Admitting Provider Note  Report given to:  Additional Notes:

## 2019-04-22 NOTE — Progress Notes (Signed)
Initial Nutrition Assessment  RD working remotely.   DOCUMENTATION CODES:   Obesity unspecified  INTERVENTION:  - continue Ensure Enlive BID, each supplement provides 350 kcal and 20 grams of protein. - continue 30 mL Prostat BID, each supplement provides 100 kcal and 15 grams of protein. - continue to encourage PO intakes. - will monitor for need to adjust ONS.    NUTRITION DIAGNOSIS:   Increased nutrient needs related to acute illness as evidenced by estimated needs.  GOAL:   Patient will meet greater than or equal to 90% of their needs  MONITOR:   PO intake, Supplement acceptance, Labs, Weight trends  REASON FOR ASSESSMENT:   Malnutrition Screening Tool  ASSESSMENT:   58 y.o. female with medical history significant of HTN, COPD, DM, anxiety with depression, DJD, schizoaffective disorder, and GERD. She presented to the ED on 10/27 with persistent N/V, abdominal pain, and confusion. She resides at [facility] and staff there noted the N/V and increasing confusion on 10/27 AM. She was recently switched from valium to Xanax which has elicited some changes in her. She was noted to be in hypertensive urgency and have a serum Na of 121 mmol/l. She was admitted for evaluation of these things. Acute abdominal series did not show any significant findings; CT abd/pelvis showed umbilical hernia and no other findings.  No intakes documented since admission. Ensure Enlive and 30 ml prostat were ordered BID at the time of admission. Patient has not yet been offered Ensure. She was offered 1 packet of prostat, which she declined. Flow sheet documentation indicates that patient is a/o to self only. RD is working remotely today and did not attempt to call patient given documented mentation.   Per chart review, current weight is 182 lb and weight on 03/10/19 was 197 lb. This indicates 15 lb weight loss (7.6% body weight) in the past 1.5 months; significant for time frame. Also noted current high rate  IVF. Unable to determine if patient meets criteria for malnutrition at this time.   Per notes: - malignant HTN - hyponatremia--121 mmol/l on admission - N/V and abdominal pain--CT abd negative - COPD without exacerbation  - hx of CHF--currently compensated   Labs reviewed; Na: 127 mmol/l, Cl: 93 mmol/l. Medications reviewed; 100 mg colace BID, 5 mg melatonin/night, daily multivitamin with minerals, 1 packet miralax/day, 1 g oral NaCl TID, 500 mg ascorbic acid BID, 220 mg zinc sulfate/day.  IVF; NS @ 125 ml/hr.      NUTRITION - FOCUSED PHYSICAL EXAM:  unable to complete at this time.   Diet Order:   Diet Order            Diet Heart Room service appropriate? Yes; Fluid consistency: Thin  Diet effective now              EDUCATION NEEDS:   Not appropriate for education at this time  Skin:  Skin Assessment: Reviewed RN Assessment  Last BM:  PTA/unknown  Height:   Ht Readings from Last 1 Encounters:  04/22/19 5\' 3"  (1.6 m)    Weight:   Wt Readings from Last 1 Encounters:  04/22/19 82.5 kg    Ideal Body Weight:  52.3 kg  BMI:  Body mass index is 32.22 kg/m.  Estimated Nutritional Needs:   Kcal:  1900-2100 kcal  Protein:  95-110 grams  Fluid:  >/= 1.8 L/day      Jarome Matin, MS, RD, LDN, [facility] Inpatient Clinical Dietitian Pager # [phone] After hours/weekend pager # [phone]

## 2019-04-23 LAB — BASIC METABOLIC PANEL
Anion gap: 6 (ref 5–15)
BUN: 13 mg/dL (ref 6–20)
CO2: 23 mmol/L (ref 22–32)
Calcium: 8.5 mg/dL — ABNORMAL LOW (ref 8.9–10.3)
Chloride: 102 mmol/L (ref 98–111)
Creatinine, Ser: 0.56 mg/dL (ref 0.44–1.00)
GFR calc Af Amer: >60 mL/min (ref >60)
GFR calc non Af Amer: >60 mL/min (ref >60)
Glucose, Bld: 88 mg/dL (ref 70–99)
Potassium: 3.8 mmol/L (ref 3.5–5.1)
Sodium: 131 mmol/L — ABNORMAL LOW (ref 135–145)

## 2019-04-23 LAB — TROPONIN I (HIGH SENSITIVITY)
Troponin I (High Sensitivity): 7 ng/L (ref <18)
Troponin I (High Sensitivity): 7 ng/L (ref <18)

## 2019-04-23 MED ORDER — LISINOPRIL 20 MG PO TABS
20.0000 mg | ORAL_TABLET | Freq: Once | ORAL | Status: AC
  Administered 2019-04-23: 20 mg via ORAL
  Filled 2019-04-23: qty 1

## 2019-04-23 MED ORDER — HYDRALAZINE HCL 20 MG/ML IJ SOLN
10.0000 mg | INTRAMUSCULAR | Status: DC | PRN
  Administered 2019-04-23: 10 mg via INTRAVENOUS
  Filled 2019-04-23: qty 1

## 2019-04-23 MED ORDER — LISINOPRIL 20 MG PO TABS
40.0000 mg | ORAL_TABLET | Freq: Every day | ORAL | Status: DC
  Administered 2019-04-24: 40 mg via ORAL
  Filled 2019-04-23: qty 2

## 2019-04-23 MED ORDER — HALOPERIDOL LACTATE 5 MG/ML IJ SOLN
1.0000 mg | Freq: Four times a day (QID) | INTRAMUSCULAR | Status: DC | PRN
  Administered 2019-04-23 (×2): 1 mg via INTRAVENOUS
  Filled 2019-04-23 (×2): qty 1

## 2019-04-23 MED ORDER — FUROSEMIDE 20 MG PO TABS
20.0000 mg | ORAL_TABLET | Freq: Every day | ORAL | Status: DC
  Administered 2019-04-23 – 2019-04-24 (×2): 20 mg via ORAL
  Filled 2019-04-23 (×2): qty 1

## 2019-04-23 NOTE — Progress Notes (Signed)
Patient agitated refusing covid swab at this time.

## 2019-04-23 NOTE — Progress Notes (Signed)
Patient started complaining of chest pain she describes as a tightness in her chest. VS taken. This RN called rapid response and notified the MD. EKG performed.   MD said the EKG looks fine and we will see what her troponin levels are.   Patient also very agitated and paranoid. She thought everyone in the room was out to hurt her. PRN haldol given.   Patient settled and resting fine.   Will continue to monitor.

## 2019-04-23 NOTE — Progress Notes (Addendum)
PROGRESS NOTE    Natalie Thomas  [id number] DOB: 01-05-1961 DOA: 04/21/2019 PCP: System, Pcp Not In    Brief Narrative:   Natalie Thomas is a 58 y.o. female with medical history significant of hypertension, COPD, diabetes, anxiety with depression, degenerative disc disease GERD who presented to the ER with persistent nausea vomiting and abdominal pain.  She has history of diabetes with schizoaffective disorder.  She has some abdominal pain.  No persistent diarrhea.  No fever or chills no contact with anybody with COVID-19.  She lives in the skilled facility where the staff noted the nausea vomiting and increasing confusion only this morning.  She was previously on Valium but switch to Xanax and that has triggered some changes in her.  She was noted to have hypertensive urgency in addition to sodium of 121.  She does not appear to be dehydrated.  Her BUN is 14.  Patient is being admitted to the hospital for evaluation of hyponatremia but also hypertensive urgency..  ED Course: Temperature is 98.2 blood pressure 236/138 pulse 88 respirate of 19 oxygen sats 91% room air.  White count 9.9 hemoglobin 15.3 and platelets 205.  Sodium is 121 potassium 4.0 chloride 86 CO2 24 BUN 14 creatinine 0.51 calcium 9.1 glucose 157.  CT abdomen pelvis showed mainly umbilical hernia no acute findings.  Acute abdominal series also showed no significant findings.  Patient is being admitted for further evaluation   Assessment & Plan:   Principal Problem:   Malignant hypertensive urgency Active Problems:   DM (diabetes mellitus), type 2 (HCC)   Schizoaffective disorder (HCC)   GERD   Acute hyponatremia   Chronic respiratory failure with hypoxia (HCC)   Type 2 diabetes mellitus without complication (HCC)   Chronic diastolic CHF (congestive heart failure) (HCC)   Malignant hypertensive urgency Blood pressure on presentation 253/83.  Home regimen includes metoprolol tartrate 25 mg p.o. twice daily,  nifedipine 90 mg p.o. daily and furosemide 20 mg p.o. twice daily. --Started on labetalol IV drip; attempt to titrate off today --Continue home metoprolol tartrate 25 mg p.o. twice daily, nifedipine 90 mg p.o. daily --increase lisinopril from 20 to 40mg  p.o. daily --resumed furosemide 20mg  PO once daily (on BID at home) --Monitor BP q4hrs  Acute on chronic hyponatremia Patient with history of hyponatremia with baseline range 125-130.  On several psychiatric medications that could be contributing as well as furosemide and can be related to psychogenic polydipsia versus hypovolemia.  Sodium on presentation 121, with urine sodium 32 and urine osmolality 125. --Na 121-->127-->131 --resumed furosemide 20mg  PO once daily (on BID at home) --Sodium chloride tablet 1 g TID --Repeat BMP in the a.m.  Type 2 diabetes mellitus Diet controlled.  Hemoglobin A1c 5.2 12/07/2018.  Abdominal pain Patient with history of chronic constipation/GERD.  Acute abdominal series unrevealing.  CT abdomen/pelvis with umbilical herniation of bowel loops without evidence of incarceration/bowel obstruction and no other identified abnormality. --Protonix 40 mg p.o. daily  Hx COPD with chronic hypoxic respiratory failure Patient on home oxygen at 2 L/min via nasal cannula.  Currently on 2 L nasal cannula oxygenating 93%.  This x-ray notable for previous left pleural effusion now resolved with mild left basilar atelectasis no acute findings. --Covid-19/SARS-CoV-2: negative 10/27 --Continue home Breo Ellipta inhaler --Continue to titrate supplemental oxygen to maintain SPO2 greater than 88%  Schizoaffective disorder with depression and paranoia --Alprazolam 0.5 mg p.o. twice daily --Latuda 40 mg p.o. daily --Seroquel 100 mg p.o. twice daily --Valproic acid  500 mg p.o. twice daily --Haldol 1mg  IV q6h prn severe agitation  Hx diastolic congestive heart failure TTE 12/04/2018 with EF greater than 65%, impaired relaxation,  RV mildly enlarged, trivial circumferential pericardial effusion without evidence of tamponade.  Home regimen includes metoprolol 25 mg p.o. twice daily, Lasix 20 mg p.o. twice daily.   --Continue metoprolol 25 mg p.o. twice daily, started on lisinopril 40mg  PO daily --resumed furosemide 20mg  PO once daily (on BID at home) --Monitor strict I's and O's and daily weights   DVT prophylaxis: Heparin Code Status: Full code Family Communication: None Disposition Plan: Likely discharge back to her memory care unit, at [facility] when medically ready.  Likely 1-2 days, will repeat Covid-19 test today in anticipation of discharge soon.   Consultants:   None  Procedures:   None  Antimicrobials:    None   Subjective: Patient seen and examined at bedside, pleasantly confused.  Unable to obtain any further ROS secondary to her schizophrenic disorder and nonsensical speech.  Slightly agitated.  Complained of chest discomfort and nursing staff, EKG with normal sinus rhythm with high-sensitivity troponin 7, symptoms appear noncardiac.  No other concerns per nursing staff.  Objective: Vitals:   04/23/19 0750 04/23/19 1102 04/23/19 1109 04/23/19 1218  BP:  (!) 205/60 (!) 153/82 (!) 150/81  Pulse:  65 81 80  Resp:  20 (!) 22 19  Temp:  98.1 F (36.7 C)  98.5 F (36.9 C)  TempSrc:    Oral  SpO2: 97% 98% 98% 98%  Weight:      Height:        Intake/Output Summary (Last 24 hours) at 04/23/2019 1451 Last data filed at 04/23/2019 0954 Gross per 24 hour  Intake 2572.61 ml  Output 2150 ml  Net 422.61 ml   Filed Weights   04/22/19 0100  Weight: 82.5 kg    Examination:  General exam: Appears calm and comfortable, pleasantly confused, nonsensical speech Respiratory system: Clear to auscultation. Respiratory effort normal.  On 3 L nasal cannula satting 93% Cardiovascular system: S1 & S2 heard, RRR. No JVD, murmurs, rubs, gallops or clicks.  Trace to 1+  pedal edema. Gastrointestinal  system: Abdomen is nondistended, soft and nontender. No organomegaly or masses felt. Normal bowel sounds heard. Central nervous system: Alert, not oriented. No focal neurological deficits. Extremities: Symmetric 5 x 5 power.  Moves all extremities independently Skin: No rashes, lesions or ulcers Psychiatry: Judgement and insight appear poor, depressed mood/flat affect    Data Reviewed: I have personally reviewed following labs and imaging studies  CBC: Recent Labs  Lab 04/21/19 1746 04/22/19 0149  WBC 9.9 8.5  HGB 15.3* 16.1*  HCT 44.2 47.6*  MCV 82.6 83.8  PLT 204 [id number]   Basic Metabolic Panel: Recent Labs  Lab 04/21/19 1746 04/22/19 0149 04/23/19 0539  NA 121* 127* 131*  K 4.0 3.9 3.8  CL 86* 93* 102  CO2 24 24 23   GLUCOSE 157* 129* 88  BUN 14 9 13   CREATININE 0.51 0.49 0.56  CALCIUM 9.0 9.2 8.5*   GFR: Estimated Creatinine Clearance: 78.9 mL/min (by C-G formula based on SCr of 0.56 mg/dL). Liver Function Tests: Recent Labs  Lab 04/21/19 1746 04/22/19 0149  AST 17 17  ALT 12 15  ALKPHOS 100 102  BILITOT 0.5 0.3  PROT 8.0 7.7  ALBUMIN 3.7 3.7   Recent Labs  Lab 04/21/19 1746  LIPASE 32   No results for input(s): AMMONIA in the last 168 hours. Coagulation  Profile: No results for input(s): INR, PROTIME in the last 168 hours. Cardiac Enzymes: No results for input(s): CKTOTAL, CKMB, CKMBINDEX, TROPONINI in the last 168 hours. BNP (last 3 results) No results for input(s): PROBNP in the last 8760 hours. HbA1C: No results for input(s): HGBA1C in the last 72 hours. CBG: No results for input(s): GLUCAP in the last 168 hours. Lipid Profile: No results for input(s): CHOL, HDL, LDLCALC, TRIG, CHOLHDL, LDLDIRECT in the last 72 hours. Thyroid Function Tests: No results for input(s): TSH, T4TOTAL, FREET4, T3FREE, THYROIDAB in the last 72 hours. Anemia Panel: No results for input(s): VITAMINB12, FOLATE, FERRITIN, TIBC, IRON, RETICCTPCT in the last 72  hours. Sepsis Labs: No results for input(s): PROCALCITON, LATICACIDVEN in the last 168 hours.  Recent Results (from the past 240 hour(s))  SARS CORONAVIRUS 2 (TAT 6-24 HRS) Nasopharyngeal Urine, Clean Catch     Status: None   Collection Time: 04/21/19 10:16 PM   Specimen: Urine, Clean Catch; Nasopharyngeal  Result Value Ref Range Status   SARS Coronavirus 2 NEGATIVE NEGATIVE Final    Comment: (NOTE) SARS-CoV-2 target nucleic acids are NOT DETECTED. The SARS-CoV-2 RNA is generally detectable in upper and lower respiratory specimens during the acute phase of infection. Negative results do not preclude SARS-CoV-2 infection, do not rule out co-infections with other pathogens, and should not be used as the sole basis for treatment or other patient management decisions. Negative results must be combined with clinical observations, patient history, and epidemiological information. The expected result is Negative. Fact Sheet for Patients: SugarRoll.be Fact Sheet for Healthcare Providers: https://www.woods-mathews.com/ This test is not yet approved or cleared by the [place] FDA and  has been authorized for detection and/or diagnosis of SARS-CoV-2 by FDA under an Emergency Use Authorization (EUA). This EUA will remain  in effect (meaning this test can be used) for the duration of the COVID-19 declaration under Section 56 4(b)(1) of the Act, 21 U.S.C. section 360bbb-3(b)(1), unless the authorization is terminated or revoked sooner. Performed at [facility], [place] [address], [place], [place] [address]   MRSA PCR Screening     Status: Abnormal   Collection Time: 04/22/19  2:31 AM   Specimen: Nasal Mucosa; Nasopharyngeal  Result Value Ref Range Status   MRSA by PCR POSITIVE (A) NEGATIVE Final    Comment:        The GeneXpert MRSA Assay (FDA approved for NASAL specimens only), is one component of a comprehensive MRSA  colonization surveillance program. It is not intended to diagnose MRSA infection nor to guide or monitor treatment for MRSA infections. RESULT CALLED TO, READ BACK BY AND VERIFIED WITH: Patricia Nettle [id number] @ [id number] [place] Performed at [facility], [employer] [address], [place],  [place] [address]          Radiology Studies: Ct Abdomen Pelvis W Contrast  Result Date: 04/21/2019 CLINICAL DATA:  Generalized abdomen pain. EXAM: CT ABDOMEN AND PELVIS WITH CONTRAST TECHNIQUE: Multidetector CT imaging of the abdomen and pelvis was performed using the standard protocol following bolus administration of intravenous contrast. CONTRAST:  151mL OMNIPAQUE IOHEXOL 300 MG/ML  SOLN COMPARISON:  March 13, 2019 FINDINGS: Lower chest: Mild atelectasis of posterior lung bases are noted. The heart size is probably enlarged. Hepatobiliary: No focal liver abnormality is seen. Status post cholecystectomy. No biliary dilatation. Pancreas: Unremarkable. No pancreatic ductal dilatation or surrounding inflammatory changes. Spleen: Normal in size without focal abnormality. Adrenals/Urinary Tract: The bilateral adrenal glands are normal. Right kidney cysts  are noted. There is no hydronephrosis bilaterally. The bladder is distended. Stomach/Bowel: The stomach is normal. There is umbilical herniation of bowel loops without evidence of incarceration or bowel obstruction. There is no diverticulitis. The appendix is not seen but no inflammation is noted around cecum. Vascular/Lymphatic: Aortic atherosclerosis. No enlarged abdominal or pelvic lymph nodes. Reproductive: The uterus is not seen. No abnormal mass is identified in the bilateral adnexa. Other: None. Musculoskeletal: Degenerative joint changes of the spine are identified. Chronic compression deformities of several vertebral bodies are unchanged. IMPRESSION: Umbilical herniation of bowel loops without evidence of incarceration or bowel  obstruction. No acute abnormality is identified in the abdomen and pelvis. Electronically Signed   By: Abelardo Diesel M.D.   On: 04/21/2019 21:24   Dg Abdomen Acute W/chest  Result Date: 04/21/2019 CLINICAL DATA:  Nausea and vomiting EXAM: DG ABDOMEN ACUTE W/ 1V CHEST COMPARISON:  03/13/2019 FINDINGS: Cardiac shadow is enlarged in size. Aortic calcifications are noted. Lungs are well aerated bilaterally. Previously seen left pleural effusion has resolved in the interval. Minimal left basilar atelectasis is seen. Scattered large and small bowel gas is noted. No definitive obstructive changes are seen. Changes of prior sacroplasty are noted. Surgical repair of the proximal right femur is noted as well. No free air is seen. IMPRESSION: Minimal left basilar atelectasis. No acute abnormality noted in the abdomen. Electronically Signed   By: Inez Catalina M.D.   On: 04/21/2019 19:48        Scheduled Meds:  ALPRAZolam  0.5 mg Oral BID   aspirin EC  81 mg Oral Daily   buPROPion  150 mg Oral Daily   Chlorhexidine Gluconate Cloth  6 each Topical Daily   docusate sodium  100 mg Oral BID   feeding supplement (ENSURE ENLIVE)  237 mL Oral Daily   feeding supplement (PRO-STAT SUGAR FREE 64)  30 mL Oral BID   fluticasone  2 spray Each Nare BID   fluticasone furoate-vilanterol  1 puff Inhalation Daily   furosemide  20 mg Oral Daily   gabapentin  100 mg Oral Daily   guaiFENesin  1,200 mg Oral Daily   heparin  5,000 Units Subcutaneous Q8H   hydrOXYzine  25 mg Oral TID   lisinopril  20 mg Oral Daily   loratadine  10 mg Oral Daily   lurasidone  40 mg Oral Q breakfast   mouth rinse  15 mL Mouth Rinse BID   Melatonin  5 mg Oral QHS   methocarbamol  500 mg Oral Daily   metoprolol tartrate  25 mg Oral BID   multivitamin with minerals  1 tablet Oral Daily   mupirocin ointment  1 application Nasal BID   NIFEdipine  90 mg Oral Daily   pantoprazole  40 mg Oral Daily   polyethylene  glycol  17 g Oral Daily   QUEtiapine  100 mg Oral BID   sodium chloride  1 g Oral TID WC   valproic acid  500 mg Oral BID   vitamin C  500 mg Oral BID   zinc sulfate  220 mg Oral Daily   Continuous Infusions:  sodium chloride 1,000 mL (04/23/19 1001)     LOS: 2 days    Time spent: 31 minutes spent on chart review, discussion with nursing staff, consultants, updating family and interview/physical exam; more than 50% of that time was spent in counseling and/or coordination of care.    Tressy Kunzman J [place] DO [facility] 04/23/2019, 2:51 PM

## 2019-04-23 NOTE — Significant Event (Signed)
Rapid Response Event Note  Overview: Time Called: 1101 Arrival Time: 1104 Event Type: Cardiac  Initial Focused Assessment: Pt yelling in bed. Confused but able to answer name and describe pain as pressure in chest. Unable to rate pain or describe onset or if pain has ever occurred before. Pt sitting in urine. RN reports BP has been 200s. Blood pressure cuff placed on L arm and reading 153/82 (104).  BP 153/82 (104) NSR 81 98% [place] 2L RR 22   Interventions: Stat EKG obtained- NSR 1 degree block Stat Troponin Oral hydrocodone given for pain MD notified- haldol ordered PRN  Plan of Care (if not transferred): RN to administer haldol. Pt remains in 1512. RN to continue to monitor patients CP and call rapid or MD with any concerns.         Wray Kearns

## 2019-04-23 NOTE — TOC Initial Note (Signed)
Transition of Care [facility]) - Initial/Assessment Note    Patient Details  Name: Natalie Thomas MRN: [id number] Date of Birth: [birth year]  Transition [facility] Care [facility]) CM/SW Contact:    Trish Mage, LCSW Phone Number: 04/23/2019, 1:43 PM  Clinical Narrative:    Ms Curless is a long term resident at [facility]-she has been there for several years.  Has been evaluated in our ED a couple of times over the past year due to SI, complaints of mistreatment by facility staff, and once was IVC'd by facility due to aggression and SI.  Never hospitalized at [facility], always returned to facility.  Has a guardian-either Ms Owens Shark or Ms Liam Rogers.  Am awaiting a call back after leaving a message.  She will return to [facility] at d/c, will need COVID with in 48 hours and FL2. TOC will continue to follow during the course of hospitalization.               Expected Discharge Plan: Assisted Living Barriers to Discharge: No Barriers Identified   Patient Goals and CMS Choice        Expected Discharge Plan and Services Expected Discharge Plan: Assisted Living   Discharge Planning Services: CM Consult   Living arrangements for the past 2 months: Assisted Living Facility                                      Prior Living Arrangements/Services Living arrangements for the past 2 months: [place] Lives with:: Facility Resident Patient language and need for interpreter reviewed:: Yes Do you feel safe going back to the place where you live?: Yes      Need for Family Participation in Patient Care: No (Comment) Care giver support system in place?: Yes (comment) Current home services: DME Criminal Activity/Legal Involvement Pertinent to Current Situation/Hospitalization: No - Comment as needed  Activities of Daily Living Home Assistive Devices/Equipment: None ADL Screening (condition at time of admission) Patient's cognitive ability adequate to safely complete daily  activities?: No Is the patient deaf or have difficulty hearing?: No Does the patient have difficulty seeing, even when wearing glasses/contacts?: No Does the patient have difficulty concentrating, remembering, or making decisions?: Yes Patient able to express need for assistance with ADLs?: No Does the patient have difficulty dressing or bathing?: Yes Independently performs ADLs?: No Does the patient have difficulty walking or climbing stairs?: Yes Weakness of Legs: None Weakness of Arms/Hands: None  Permission Sought/Granted Permission sought to share information with : Guardian Permission granted to share information with : Yes, Verbal Permission Granted              Emotional Assessment Appearance:: Appears stated age Attitude/Demeanor/Rapport: Complaining Affect (typically observed): Irritable Orientation: : Oriented to Self Alcohol / Substance Use: Not Applicable Psych Involvement: No (comment)  Admission diagnosis:  Hyponatremia [E87.1] Nausea and vomiting, intractability of vomiting not specified, unspecified vomiting type [R11.2] Hypertension, unspecified type [I10] Patient Active Problem List   Diagnosis Date Noted  . Malignant hypertensive urgency 04/21/2019  . Abdominal pain 03/15/2019  . Pleural effusion 03/13/2019  . Atelectasis 03/13/2019  . Chronic diastolic CHF (congestive heart failure) ([place]) 03/13/2019  . Type 2 diabetes mellitus without complication ([place]) [id number]  . Urinary tract bacterial infections 03/09/2019  . Acute cystitis without hematuria   . Constipation   . Acute hypoxemic respiratory failure ([place]) 12/03/2018  . Irritability 11/09/2018  .  Aggressive behavior 11/09/2018  . Agitation   . S/P right hip fracture 11/07/2017  . Closed comminuted intertrochanteric fracture of right femur ([place]) 11/07/2017  . Anxiety and depression   . Dyspnea 10/16/2017  . Anemia 10/16/2017  . COPD exacerbation ([place]) 10/07/2017  . Hyperlipidemia 10/07/2017   . Chronic respiratory failure with hypoxia ([place]) 10/07/2017  . Acute on chronic respiratory failure with hypoxia and hypercapnia ([place]) 10/07/2017  . Incontinence of urine 08/14/2011  . Acute hyponatremia 07/27/2011  . Schizoaffective disorder, bipolar type ([place]) 07/05/2011  . Cough 05/08/2011  . Encounter for long-term (current) use of other medications 04/09/2011  . Lumbar disc disease 04/09/2011  . Polycythemia secondary to smoking 04/09/2011  . HIP PAIN, LEFT 12/12/2007  . 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 DM (diabetes mellitus), type 2 ([place]) 04/08/2007  . MENOPAUSE, PREMATURE 04/08/2007  . Chronic hyponatremia 04/08/2007  . Schizoaffective disorder ([place]) 04/08/2007  . DEPRESSION 04/08/2007  . Essential hypertension 04/08/2007  . COPD 04/08/2007  . GERD 04/08/2007  . Osteoporosis 04/08/2007  . DYSURIA 04/08/2007   PCP:  System, Pcp Not In Pharmacy:   [place], [address] [place] [address] Phone: [phone] Fax: [place], [address] [place] [address] Phone: [phone] Fax: [phone]     Social Determinants of Health (SDOH) Interventions    Readmission Risk Interventions Readmission Risk Prevention Plan 03/10/2019 12/08/2018  Transportation Screening Complete Complete  Medication Review ([place]) Complete Complete  PCP or Specialist appointment within 3-5 days of discharge Complete Complete  HRI or Home Care Consult Complete Complete  SW Recovery Care/Counseling Consult Complete Complete  Palliative Care Screening Not Applicable Not Applicable  Skilled Nursing Facility Complete Complete  Some recent data might be hidden

## 2019-04-23 NOTE — Progress Notes (Addendum)
BP 212/102 taken manually after multiple BPs by dinamap. Paged Dr. Linda Hedges. New order for 10mg  IV hydralazine PRN order placed and given. Will continue to monitor.

## 2019-04-24 LAB — BASIC METABOLIC PANEL
Anion gap: 9 (ref 5–15)
BUN: 12 mg/dL (ref 6–20)
CO2: 25 mmol/L (ref 22–32)
Calcium: 8.9 mg/dL (ref 8.9–10.3)
Chloride: 97 mmol/L — ABNORMAL LOW (ref 98–111)
Creatinine, Ser: 0.56 mg/dL (ref 0.44–1.00)
GFR calc Af Amer: >60 mL/min (ref >60)
GFR calc non Af Amer: >60 mL/min (ref >60)
Glucose, Bld: 90 mg/dL (ref 70–99)
Potassium: 3.9 mmol/L (ref 3.5–5.1)
Sodium: 131 mmol/L — ABNORMAL LOW (ref 135–145)

## 2019-04-24 LAB — SARS CORONAVIRUS 2 (TAT 6-24 HRS): SARS Coronavirus 2: NEGATIVE

## 2019-04-24 MED ORDER — OXYCODONE HCL 5 MG PO TABS: 5.0000 mg | ORAL_TABLET | Freq: Two times a day (BID) | ORAL | 0 refills | Status: DC | PRN

## 2019-04-24 MED ORDER — ALPRAZOLAM 0.5 MG PO TABS: 0.5000 mg | ORAL_TABLET | Freq: Two times a day (BID) | ORAL | 0 refills | Status: DC

## 2019-04-24 MED ORDER — LISINOPRIL 40 MG PO TABS: 40.0000 mg | ORAL_TABLET | Freq: Every day | ORAL | 0 refills | Status: DC

## 2019-04-24 MED ORDER — FUROSEMIDE 20 MG PO TABS: 20.0000 mg | ORAL_TABLET | Freq: Every day | ORAL | 0 refills | Status: DC

## 2019-04-24 NOTE — TOC Transition Note (Addendum)
Transition of Care [facility]) - CM/SW Discharge Note   Patient Details  Name: Natalie Thomas MRN: [id number] Date of Birth: 1961/02/23  Transition of Care [facility]) CM/SW Contact:  Trish Mage, LCSW Phone Number: 04/24/2019, 3:57 PM   Clinical Narrative:   Pt to d/c today, return to [place].  Faxed over FL2 and d/c summary for facility.  Arranged transportation with [facility]. Nursing please call report to [phone].  Never received call back from guardian.  TOC sign off.     Final next level of care: Assisted Living Barriers to Discharge: No Barriers Identified   Patient Goals and CMS Choice        Discharge Placement                       Discharge Plan and Services   Discharge Planning Services: CM Consult                                 Social Determinants of Health (SDOH) Interventions     Readmission Risk Interventions Readmission Risk Prevention Plan 03/10/2019 12/08/2018  Transportation Screening Complete Complete  Medication Review [employer]) Complete Complete  PCP or Specialist appointment within 3-5 days of discharge Complete Complete  HRI or Home Care Consult Complete Complete  SW Recovery Care/Counseling Consult Complete Complete  Palliative Care Screening Not Applicable Not Applicable  Skilled Nursing Facility Complete Complete  Some recent data might be hidden

## 2019-04-24 NOTE — NC FL2 (Signed)
[place] MEDICAID FL2 LEVEL OF CARE SCREENING TOOL     IDENTIFICATION  Patient Name: Natalie Thomas Birthdate: Oct 29, 1960 Sex: female Admission Date (Current Location): 04/21/2019  [facility] and [place] Number:  Herbalist and Address:  [facility],  [place], [place]      Provider Number: [id number]  Attending Physician Name and Address:  [place] Donnamarie Poag, DO  Relative Name and Phone Number:       Current Level of Care: Hospital Recommended Level of Care: [place] Prior Approval Number:    Date Approved/Denied:   PASRR Number:    Discharge Plan: Domiciliary (Rest home) [facility]   Current Diagnoses: Patient Active Problem List   Diagnosis Date Noted  . Malignant hypertensive urgency 04/21/2019  . Abdominal pain 03/15/2019  . Pleural effusion 03/13/2019  . Atelectasis 03/13/2019  . Chronic diastolic CHF (congestive heart failure) ([place]) 03/13/2019  . Type 2 diabetes mellitus without complication ([place]) [id number]  . Urinary tract bacterial infections 03/09/2019  . Acute cystitis without hematuria   . Constipation   . Acute hypoxemic respiratory failure ([place]) 12/03/2018  . Irritability 11/09/2018  . Aggressive behavior 11/09/2018  . Agitation   . S/P right hip fracture 11/07/2017  . Closed comminuted intertrochanteric fracture of right femur ([place]) 11/07/2017  . Anxiety and depression   . Dyspnea 10/16/2017  . Anemia 10/16/2017  . COPD exacerbation ([place]) 10/07/2017  . Hyperlipidemia 10/07/2017  . Chronic respiratory failure with hypoxia ([place]) 10/07/2017  . Acute on chronic respiratory failure with hypoxia and hypercapnia ([place]) 10/07/2017  . Incontinence of urine 08/14/2011  . Acute hyponatremia 07/27/2011  . Schizoaffective disorder, bipolar type ([place]) 07/05/2011  . Cough 05/08/2011  . Encounter for long-term (current) use of other medications 04/09/2011  . Lumbar disc disease 04/09/2011  .  Polycythemia secondary to smoking 04/09/2011  . HIP PAIN, LEFT 12/12/2007  . 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 DM (diabetes mellitus), type 2 ([place]) 04/08/2007  . MENOPAUSE, PREMATURE 04/08/2007  . Chronic hyponatremia 04/08/2007  . Schizoaffective disorder ([place]) 04/08/2007  . DEPRESSION 04/08/2007  . Essential hypertension 04/08/2007  . COPD 04/08/2007  . GERD 04/08/2007  . Osteoporosis 04/08/2007  . DYSURIA 04/08/2007    Orientation RESPIRATION BLADDER Height & Weight     Self  O2(nasal cannula 2L) Continent, Incontinent Weight: 82.5 kg Height:  5\' 3"  (160 cm)  BEHAVIORAL SYMPTOMS/MOOD NEUROLOGICAL BOWEL NUTRITION STATUS  (none) (none) Continent, Incontinent Diet(No added salt)  AMBULATORY STATUS COMMUNICATION OF NEEDS Skin   Limited Assist Verbally Normal                       Personal Care Assistance Level of Assistance  Bathing, Feeding, Dressing Bathing Assistance: Limited assistance Feeding assistance: Independent Dressing Assistance: Limited assistance     Functional Limitations Info  Sight, Hearing, Speech Sight Info: Impaired Hearing Info: Adequate Speech Info: Adequate    SPECIAL CARE FACTORS FREQUENCY  PT (By licensed PT)     PT Frequency: 2X/W              Contractures Contractures Info: Not present    Additional Factors Info  Code Status, Allergies Code Status Info: full Allergies Info: Clarithromycin, Penicillins           Current Medications (04/24/2019):  This is the current hospital active medication list Current Facility-Administered Medications  Medication Dose Route Frequency Provider Last Rate Last Dose  . 0.9 %  sodium  chloride infusion  1,000 mL Intravenous Continuous Dorie Rank, MD 125 mL/hr at 04/24/19 0842 1,000 mL at 04/24/19 0842  . acetaminophen (TYLENOL) tablet 650 mg  650 mg Oral Q6H PRN Elwyn Reach, MD       Or  . acetaminophen (TYLENOL) suppository 650 mg  650 mg Rectal Q6H PRN Elwyn Reach, MD       . ALPRAZolam Duanne Moron) tablet 0.5 mg  0.5 mg Oral BID Gala Romney L, MD   0.5 mg at 04/24/19 1151  . alum & mag hydroxide-simeth (MAALOX/MYLANTA) 200-200-20 MG/5ML suspension 30 mL  30 mL Oral QID PRN Elwyn Reach, MD      . aspirin EC tablet 81 mg  81 mg Oral Daily Gala Romney L, MD   81 mg at 04/24/19 1151  . benzonatate (TESSALON) capsule 100 mg  100 mg Oral TID PRN Gala Romney L, MD      . buPROPion (WELLBUTRIN XL) 24 hr tablet 150 mg  150 mg Oral Daily Gala Romney L, MD   150 mg at 04/24/19 1152  . calcium carbonate (TUMS - dosed in mg elemental calcium) chewable tablet 200 mg of elemental calcium  1 tablet Oral BID PRN Elwyn Reach, MD      . Chlorhexidine Gluconate Cloth 2 % PADS 6 each  6 each Topical Daily Elwyn Reach, MD   6 each at 04/22/19 [phone]  . docusate sodium (COLACE) capsule 100 mg  100 mg Oral BID PRN Gala Romney L, MD      . docusate sodium (COLACE) capsule 100 mg  100 mg Oral BID Gala Romney L, MD   100 mg at 04/24/19 1152  . feeding supplement (ENSURE ENLIVE) (ENSURE ENLIVE) liquid 237 mL  237 mL Oral Daily Gala Romney L, MD   237 mL at 04/24/19 1158  . feeding supplement (PRO-STAT SUGAR FREE 64) liquid 30 mL  30 mL Oral BID Gala Romney L, MD   30 mL at 04/24/19 1150  . fluticasone (FLONASE) 50 MCG/ACT nasal spray 2 spray  2 spray Each Nare BID Elwyn Reach, MD   2 spray at 04/24/19 1158  . fluticasone furoate-vilanterol (BREO ELLIPTA) 100-25 MCG/INH 1 puff  1 puff Inhalation Daily Gala Romney L, MD   1 puff at 04/24/19 1159  . furosemide (LASIX) tablet 20 mg  20 mg Oral Daily [place] Donnamarie Poag, DO   20 mg at 04/24/19 1152  . gabapentin (NEURONTIN) capsule 100 mg  100 mg Oral Daily Gala Romney L, MD   100 mg at 04/24/19 1200  . guaiFENesin (MUCINEX) 12 hr tablet 1,200 mg  1,200 mg Oral Daily Gala Romney L, MD   1,200 mg at 04/24/19 1152  . guaiFENesin (ROBITUSSIN) 100 MG/5ML solution 200 mg  200 mg Oral TID PRN  Gala Romney L, MD      . haloperidol lactate (HALDOL) injection 1 mg  1 mg Intravenous Q6H PRN [place] Donnamarie Poag, DO   1 mg at 04/23/19 1745  . heparin injection 5,000 Units  5,000 Units Subcutaneous Q8H Elwyn Reach, MD   5,000 Units at 04/24/19 [phone]  . hydrALAZINE (APRESOLINE) injection 10 mg  10 mg Intravenous Q4H PRN Norins, Heinz Knuckles, MD   10 mg at 04/23/19 2346  . hydrALAZINE (APRESOLINE) tablet 25 mg  25 mg Oral Q6H PRN [place] Eric J, DO   25 mg at 04/23/19 1105  . hydrOXYzine (ATARAX/VISTARIL) tablet 25 mg  25 mg Oral TID Gala Romney  L, MD   25 mg at 04/24/19 1150  . lisinopril (ZESTRIL) tablet 40 mg  40 mg Oral Daily [place] Eric J, DO   40 mg at 04/24/19 1151  . loratadine (CLARITIN) tablet 10 mg  10 mg Oral Daily Gala Romney L, MD   10 mg at 04/24/19 1153  . lurasidone (LATUDA) tablet 40 mg  40 mg Oral Q breakfast Gala Romney L, MD   40 mg at 04/24/19 1204  . MEDLINE mouth rinse  15 mL Mouth Rinse BID Gala Romney L, MD   15 mL at 04/24/19 1200  . Melatonin TABS 5 mg  5 mg Oral QHS Elwyn Reach, MD   5 mg at 04/23/19 2334  . methocarbamol (ROBAXIN) tablet 500 mg  500 mg Oral Q8H PRN Gala Romney L, MD      . methocarbamol (ROBAXIN) tablet 500 mg  500 mg Oral Daily Gala Romney L, MD   500 mg at 04/24/19 1153  . metoprolol tartrate (LOPRESSOR) tablet 25 mg  25 mg Oral BID Gala Romney L, MD   25 mg at 04/24/19 1151  . multivitamin with minerals tablet 1 tablet  1 tablet Oral Daily Elwyn Reach, MD   1 tablet at 04/24/19 1153  . mupirocin ointment (BACTROBAN) 2 % 1 application  1 application Nasal BID [place] Eric J, DO   1 application at [phone]  . NIFEdipine (PROCARDIA-XL/NIFEDICAL-XL) 24 hr tablet 90 mg  90 mg Oral Daily Gala Romney L, MD   90 mg at 04/24/19 1203  . nystatin (MYCOSTATIN/NYSTOP) topical powder   Topical BID PRN Gala Romney L, MD      . ondansetron (ZOFRAN) tablet 4 mg  4 mg Oral Q6H PRN Elwyn Reach, MD        Or  . ondansetron (ZOFRAN) injection 4 mg  4 mg Intravenous Q6H PRN Gala Romney L, MD      . oxyCODONE (Oxy IR/ROXICODONE) immediate release tablet 5 mg  5 mg Oral Q12H PRN Elwyn Reach, MD   5 mg at 04/24/19 1155  . pantoprazole (PROTONIX) EC tablet 40 mg  40 mg Oral Daily Gala Romney L, MD   40 mg at 04/24/19 1150  . polyethylene glycol (MIRALAX / GLYCOLAX) packet 17 g  17 g Oral Daily Gala Romney L, MD   17 g at 04/24/19 1150  . QUEtiapine (SEROQUEL) tablet 100 mg  100 mg Oral BID Gala Romney L, MD   100 mg at 04/23/19 2332  . sodium chloride tablet 1 g  1 g Oral TID WC Gala Romney L, MD   1 g at 04/24/19 1154  . valproic acid (DEPAKENE) 250 MG capsule 500 mg  500 mg Oral BID Gala Romney L, MD   500 mg at 04/24/19 1203  . vitamin C (ASCORBIC ACID) tablet 500 mg  500 mg Oral BID Gala Romney L, MD   500 mg at 04/24/19 1151  . zinc sulfate capsule 220 mg  220 mg Oral Daily Elwyn Reach, MD   220 mg at 04/24/19 1203     Discharge Medications: Please see discharge summary for a list of discharge medications.  Relevant Imaging Results:  Relevant Lab Results:   Additional Information SSN: [id number]  Trish Mage, LCSW

## 2019-04-24 NOTE — Care Management Important Message (Signed)
Important Message  Patient Details IM Letter given to [facility] SW to present to the Patient Name: Natalie Thomas MRN: [id number] Date of Birth: [birth year]   Medicare Important Message Given:  Yes     Kerin Salen 04/24/2019, 11:11 AM

## 2019-04-24 NOTE — Discharge Summary (Signed)
Physician Discharge Summary  KIELEY WEHRLI [id number] DOB: 1960-12-03 DOA: 04/21/2019  PCP: System, Pcp Not In  Admit date: 04/21/2019 Discharge date: 04/24/2019  Admitted From: [facility] memory care unit Disposition: [facility] memory care unit  Recommendations for Outpatient Follow-up:  1. Follow up with physician at facility in 1-2 weeks 2. Please obtain BMP in one week 3. Newly started on lisinopril 40 mg p.o. daily 4. Decreased furosemide from 20 mg p.o. twice daily to once daily 5.  6. Continue to monitor blood pressure closely, may need further adjustments in her antihypertensive regimen 7. Ensure patient takes her antihypertensives as well as the rest of her medications daily  Home Health: no Equipment/Devices: Oxygen 2 L per nasal cannula  Discharge Condition: Stable CODE STATUS: Full code Diet recommendation: Heart Healthy / Carb Modified   History of present illness:  OLANDA BOSSI a 58 y.o.femalewith medical history significant ofhypertension, COPD, diabetes, anxiety with depression, degenerative disc disease GERD who presented to the ER with persistent nausea vomiting and abdominal pain. She has history of diabetes with schizoaffective disorder. She has some abdominal pain. No persistent diarrhea. No fever or chills no contact with anybody with COVID-19. She lives in the skilled facility where the staff noted the nausea vomiting and increasing confusion only this morning. She was previously on Valium but switch to Xanax and that has triggered some changes in her. She was noted to have hypertensive urgency in addition to sodium of 121. She does not appear to be dehydrated. Her BUN is 14. Patient is being admitted to the hospital for evaluation of hyponatremia but also hypertensive urgency..  ED Course:Temperature is 98.2 blood pressure 236/138 pulse 88 respirate of 19 oxygen sats 91% room air. White count 9.9 hemoglobin 15.3 and  platelets 205. Sodium is 121 potassium 4.0 chloride 86 CO2 24 BUN 14 creatinine 0.51 calcium 9.1 glucose 157. CT abdomen pelvis showed mainly umbilical hernia no acute findings. Acute abdominal series also showed no significant findings. Patient is being admitted for further evaluation  Hospital course:  Malignant hypertensive urgency Blood pressure on presentation 253/83.    Initially started on labetalol drip.  Her home antihypertensives were restarted but her blood pressure still remained labile.  Patient was started on lisinopril titrated up to 40 mg p.o. daily with improvement of her blood pressures.  Patient's furosemide was decreased from 20 mg p.o. twice daily to once daily due to her chronic hyponatremia.  Continue to follow blood pressures closely outpatient and may need further titration.  Acute on chronic hyponatremia Patient with history of hyponatremia with baseline range 125-130.  On several psychiatric medications that could be contributing as well as furosemide and can be related to psychogenic polydipsia versus hypovolemia.  Sodium on presentation 121, with urine sodium 32 and urine osmolality 125.  Patient's home furosemide was reduced from 20 mg p.o. twice daily to once daily.  She continued on her sodium chloride tablet 1 g 3 times daily.  Sodium improved and was 131 at time of discharge.  Recommend repeat BMP in 1 week.  Type 2 diabetes mellitus Diet controlled.  Hemoglobin A1c 5.2 12/07/2018.  Abdominal pain Patient with history of chronic constipation/GERD.  Acute abdominal series unrevealing.  CT abdomen/pelvis with umbilical herniation of bowel loops without evidence of incarceration/bowel obstruction and no other identified abnormality.  Hx COPD with chronic hypoxic respiratory failure Patient on home oxygen at 2 L/min via nasal cannula.  Currently on 2 L nasal cannula oxygenating 93%.  This x-ray notable for previous left pleural effusion now resolved with mild  left basilar atelectasis no acute findings.  Continue home Breo Ellipta inhaler.  Continue home oxygen.  Covid-19/SARS-CoV-2 negative on 10/27 and 04/23/2019.  Schizoaffective disorder with depression and paranoia Continue home alprazolam 0.5 mg p.o. twice daily, Latuda 40 mg p.o. daily, Seroquel 100 mg p.o. twice daily, Valproic acid 500 mg p.o. twice daily  Hx diastolic congestive heart failure TTE 12/04/2018 with EF greater than 65%, impaired relaxation, RV mildly enlarged, trivial circumferential pericardial effusion without evidence of tamponade.  Continue metoprolol 25 mg p.o. twice daily, started on lisinopril 40mg  PO daily.changed furosemide to 20mg  PO once daily (was on BID at home).  Recommend continue to monitor daily weights.   Discharge Diagnoses:  Active Problems:   DM (diabetes mellitus), type 2 (HCC)   Schizoaffective disorder (HCC)   GERD   Type 2 diabetes mellitus without complication (HCC)   Chronic diastolic CHF (congestive heart failure) [facility])    Discharge Instructions  Discharge Instructions    Diet - low sodium heart healthy   Complete by: As directed    Increase activity slowly   Complete by: As directed      Allergies as of 04/24/2019      Reactions   Clarithromycin Itching   Penicillins Itching   Has tolerated cefazolin before Has patient had a PCN reaction causing immediate rash, facial/tongue/throat swelling, SOB or lightheadedness with hypotension: No Has patient had a PCN reaction causing severe rash involving mucus membranes or skin necrosis: No Has patient had a PCN reaction that required hospitalization: Unknown Has patient had a PCN reaction occurring within the last 10 years: No If all of the above answers are "NO", then may proceed with Cephalosporin use.      Medication List    STOP taking these medications   arformoterol 15 MCG/2ML Nebu Commonly known as: BROVANA   budesonide 0.5 MG/2ML nebulizer solution Commonly known as:  PULMICORT   diazepam 2 MG tablet Commonly known as: VALIUM   Saphris 5 MG Subl 24 hr tablet Generic drug: asenapine   traZODone 50 MG tablet Commonly known as: DESYREL     TAKE these medications   acetaminophen 325 MG tablet Commonly known as: TYLENOL Take 650 mg by mouth 2 (two) times daily as needed (pain). Do not exceed 3 grams/24 hours of tylenol from all sources   acetaminophen 500 MG tablet Commonly known as: TYLENOL Take 500 mg by mouth every 6 (six) hours as needed for fever.   albuterol (2.5 MG/3ML) 0.083% nebulizer solution Commonly known as: PROVENTIL Take 3 mLs (2.5 mg total) by nebulization every 4 (four) hours as needed for wheezing or shortness of breath.   ALPRAZolam 0.5 MG tablet Commonly known as: XANAX Take 0.5 mg by mouth 2 (two) times daily.   aluminum-magnesium hydroxide-simethicone [id number] MG/5ML Susp Commonly known as: MAALOX Take 30 mLs by mouth 4 (four) times daily as needed (heartburn, indigestion).   aspirin 81 MG tablet Take 1 tablet (81 mg total) by mouth daily. For heart health   benzonatate 100 MG capsule Commonly known as: TESSALON Take 1 capsule (100 mg total) by mouth 3 (three) times daily as needed for cough.   Breo Ellipta 100-25 MCG/INH Aepb Generic drug: fluticasone furoate-vilanterol Inhale 1 puff into the lungs daily.   buPROPion 150 MG 24 hr tablet Commonly known as: WELLBUTRIN XL Take 150 mg by mouth daily.   calcium carbonate 500 MG chewable tablet Commonly known as:  TUMS - dosed in mg elemental calcium Chew 1 tablet (200 mg of elemental calcium total) by mouth 2 (two) times daily as needed for indigestion or heartburn.   docusate sodium 100 MG capsule Commonly known as: COLACE Take 100 mg by mouth 2 (two) times daily as needed (constipation).   docusate sodium 100 MG capsule Commonly known as: COLACE Take 100 mg by mouth 2 (two) times daily. (scheduled)   feeding supplement (ENSURE ENLIVE) Liqd Take 237 mLs  by mouth daily.   feeding supplement (PRO-STAT SUGAR FREE 64) Liqd Take 30 mLs by mouth 2 (two) times daily.   fluticasone 50 MCG/ACT nasal spray Commonly known as: FLONASE Place 2 sprays into both nostrils 2 (two) times daily.   furosemide 20 MG tablet Commonly known as: LASIX Take 1 tablet (20 mg total) by mouth daily. Start taking on: April 25, 2019 What changed: when to take this   gabapentin 100 MG capsule Commonly known as: NEURONTIN Take 1 capsule (100 mg total) by mouth at bedtime. What changed: when to take this   hydrOXYzine 25 MG tablet Commonly known as: ATARAX/VISTARIL Take 1 tablet (25 mg total) by mouth 3 (three) times daily as needed for anxiety. What changed: when to take this   ipratropium-albuterol 0.5-2.5 (3) MG/3ML Soln Commonly known as: DUONEB Take 3 mLs by nebulization See admin instructions. Inhale one vial via hand held nebulizer three times daily, may also use every 8 hours as needed for shortness of breath   lisinopril 40 MG tablet Commonly known as: ZESTRIL Take 1 tablet (40 mg total) by mouth daily. Start taking on: April 25, 2019   loratadine 10 MG tablet Commonly known as: CLARITIN Take 10 mg by mouth daily.   lurasidone 40 MG Tabs tablet Commonly known as: LATUDA Take 1 tablet (40 mg total) by mouth 2 (two) times a day.   Melatonin 5 MG Tabs Take 5 mg by mouth at bedtime.   methocarbamol 500 MG tablet Commonly known as: ROBAXIN Take 500 mg by mouth every 8 (eight) hours as needed for muscle spasms.   methocarbamol 500 MG tablet Commonly known as: ROBAXIN Take 500 mg by mouth daily. (scheduled)   metoprolol tartrate 25 MG tablet Commonly known as: LOPRESSOR Take 1 tablet (25 mg total) by mouth 2 (two) times daily.   multivitamin with minerals Tabs tablet Take 1 tablet by mouth daily.   nicotine 21 mg/24hr patch Commonly known as: NICODERM CQ - dosed in mg/24 hours Place 1 patch (21 mg total) onto the skin daily.    NIFEdipine 90 MG 24 hr tablet Commonly known as: PROCARDIA XL/NIFEDICAL-XL Take 90 mg by mouth daily.   nystatin powder Generic drug: nystatin Apply topically 2 (two) times daily as needed (to reddened area twice daily as needed for irritation).   omeprazole 40 MG capsule Commonly known as: PRILOSEC Take 1 capsule (40 mg total) by mouth 2 (two) times daily. What changed: when to take this   oxyCODONE 5 MG immediate release tablet Commonly known as: Oxy IR/ROXICODONE Take 1 tablet (5 mg total) by mouth daily. What changed:   when to take this  reasons to take this   polyethylene glycol 17 g packet Commonly known as: MiraLax Take 17 g by mouth daily.   QUEtiapine 50 MG tablet Commonly known as: SEROQUEL Take 1 tablet (50 mg total) by mouth at bedtime. What changed:   how much to take  when to take this  Another medication with the same name  was removed. Continue taking this medication, and follow the directions you see here.   Robafen 100 MG/5ML syrup Generic drug: guaifenesin Take 200 mg by mouth 3 (three) times daily as needed for cough or congestion.   guaiFENesin 600 MG 12 hr tablet Commonly known as: MUCINEX Take 1,200 mg by mouth daily.   sodium chloride 1 g tablet Take 1 tablet (1 g total) by mouth 3 (three) times daily with meals.   valproic acid 250 MG capsule Commonly known as: DEPAKENE Take 2 capsules (500 mg total) by mouth 2 (two) times daily.   vitamin C 500 MG tablet Commonly known as: ASCORBIC ACID Take 500 mg by mouth 2 (two) times daily.   Zinc Gluconate 100 MG Tabs Take 100 mg by mouth daily.       Allergies  Allergen Reactions  . Clarithromycin Itching  . Penicillins Itching    Has tolerated cefazolin before  Has patient had a PCN reaction causing immediate rash, facial/tongue/throat swelling, SOB or lightheadedness with hypotension: No Has patient had a PCN reaction causing severe rash involving mucus membranes or skin  necrosis: No Has patient had a PCN reaction that required hospitalization: Unknown Has patient had a PCN reaction occurring within the last 10 years: No If all of the above answers are "NO", then may proceed with Cephalosporin use.     Consultations:  None   Procedures/Studies: Ct Abdomen Pelvis W Contrast  Result Date: 04/21/2019 CLINICAL DATA:  Generalized abdomen pain. EXAM: CT ABDOMEN AND PELVIS WITH CONTRAST TECHNIQUE: Multidetector CT imaging of the abdomen and pelvis was performed using the standard protocol following bolus administration of intravenous contrast. CONTRAST:  172mL OMNIPAQUE IOHEXOL 300 MG/ML  SOLN COMPARISON:  March 13, 2019 FINDINGS: Lower chest: Mild atelectasis of posterior lung bases are noted. The heart size is probably enlarged. Hepatobiliary: No focal liver abnormality is seen. Status post cholecystectomy. No biliary dilatation. Pancreas: Unremarkable. No pancreatic ductal dilatation or surrounding inflammatory changes. Spleen: Normal in size without focal abnormality. Adrenals/Urinary Tract: The bilateral adrenal glands are normal. Right kidney cysts are noted. There is no hydronephrosis bilaterally. The bladder is distended. Stomach/Bowel: The stomach is normal. There is umbilical herniation of bowel loops without evidence of incarceration or bowel obstruction. There is no diverticulitis. The appendix is not seen but no inflammation is noted around cecum. Vascular/Lymphatic: Aortic atherosclerosis. No enlarged abdominal or pelvic lymph nodes. Reproductive: The uterus is not seen. No abnormal mass is identified in the bilateral adnexa. Other: None. Musculoskeletal: Degenerative joint changes of the spine are identified. Chronic compression deformities of several vertebral bodies are unchanged. IMPRESSION: Umbilical herniation of bowel loops without evidence of incarceration or bowel obstruction. No acute abnormality is identified in the abdomen and pelvis.  Electronically Signed   By: Abelardo Diesel M.D.   On: 04/21/2019 21:24   Dg Abdomen Acute W/chest  Result Date: 04/21/2019 CLINICAL DATA:  Nausea and vomiting EXAM: DG ABDOMEN ACUTE W/ 1V CHEST COMPARISON:  03/13/2019 FINDINGS: Cardiac shadow is enlarged in size. Aortic calcifications are noted. Lungs are well aerated bilaterally. Previously seen left pleural effusion has resolved in the interval. Minimal left basilar atelectasis is seen. Scattered large and small bowel gas is noted. No definitive obstructive changes are seen. Changes of prior sacroplasty are noted. Surgical repair of the proximal right femur is noted as well. No free air is seen. IMPRESSION: Minimal left basilar atelectasis. No acute abnormality noted in the abdomen. Electronically Signed   By: Linus Mako.D.  On: 04/21/2019 19:48      Subjective: Patient seen and examined at bedside, pleasantly confused and tangential in regards to her speech pattern.  No specific complaints, but unable to fully evaluate secondary to her schizoaffective disorder.  No acute concerns overnight per nursing staff.   Discharge Exam: Vitals:   04/24/19 0848 04/24/19 1349  BP: (!) 146/90 (!) 180/74  Pulse: 72 69  Resp: 18 20  Temp: 98.1 F (36.7 C) 98.4 F (36.9 C)  SpO2: 97% 97%   Vitals:   04/24/19 0110 04/24/19 0306 04/24/19 0848 04/24/19 1349  BP: 124/67 (!) 144/94 (!) 146/90 (!) 180/74  Pulse: 65 66 72 69  Resp: 16 18 18 20   Temp: 98.4 F (36.9 C) 98.3 F (36.8 C) 98.1 F (36.7 C) 98.4 F (36.9 C)  TempSrc: Oral Oral Oral Oral  SpO2: 94% 97% 97% 97%  Weight:      Height:        General exam: Appears calm and comfortable, pleasantly confused, nonsensical speech Respiratory system: Clear to auscultation. Respiratory effort normal.  On 2 L nasal cannula satting 97% Cardiovascular system: S1 & S2 heard, RRR. No JVD, murmurs, rubs, gallops or clicks.  Trace to 1+  pedal edema. Gastrointestinal system: Abdomen is nondistended,  soft and nontender. No organomegaly or masses felt. Normal bowel sounds heard. Central nervous system: Alert, not oriented. No focal neurological deficits. Extremities: Symmetric 5 x 5 power.  Moves all extremities independently Skin: No rashes, lesions or ulcers Psychiatry: Judgement and insight appear poor, depressed mood/flat affect    The results of significant diagnostics from this hospitalization (including imaging, microbiology, ancillary and laboratory) are listed below for reference.     Microbiology: Recent Results (from the past 240 hour(s))  SARS CORONAVIRUS 2 (TAT 6-24 HRS) Nasopharyngeal Urine, Clean Catch     Status: None   Collection Time: 04/21/19 10:16 PM   Specimen: Urine, Clean Catch; Nasopharyngeal  Result Value Ref Range Status   SARS Coronavirus 2 NEGATIVE NEGATIVE Final    Comment: (NOTE) SARS-CoV-2 target nucleic acids are NOT DETECTED. The SARS-CoV-2 RNA is generally detectable in upper and lower respiratory specimens during the acute phase of infection. Negative results do not preclude SARS-CoV-2 infection, do not rule out co-infections with other pathogens, and should not be used as the sole basis for treatment or other patient management decisions. Negative results must be combined with clinical observations, patient history, and epidemiological information. The expected result is Negative. Fact Sheet for Patients: SugarRoll.be Fact Sheet for Healthcare Providers: https://www.woods-mathews.com/ This test is not yet approved or cleared by the [place] FDA and  has been authorized for detection and/or diagnosis of SARS-CoV-2 by FDA under an Emergency Use Authorization (EUA). This EUA will remain  in effect (meaning this test can be used) for the duration of the COVID-19 declaration under Section 56 4(b)(1) of the Act, 21 U.S.C. section 360bbb-3(b)(1), unless the authorization is terminated or revoked  sooner. Performed at [facility], [place] [address] [place],  [address]   MRSA PCR Screening     Status: Abnormal   Collection Time: 04/22/19  2:31 AM   Specimen: Nasal Mucosa; Nasopharyngeal  Result Value Ref Range Status   MRSA by PCR POSITIVE (A) NEGATIVE Final    Comment:        The GeneXpert MRSA Assay (FDA approved for NASAL specimens only), is one component of a comprehensive MRSA colonization surveillance program. It is not intended to diagnose MRSA infection nor  to guide or monitor treatment for MRSA infections. RESULT CALLED TO, READ BACK BY AND VERIFIED WITH: Patricia Nettle [id number] @ [id number] [place] Performed at [facility], [place] [address] [place], [place] [address]   SARS CORONAVIRUS 2 (TAT 6-24 HRS) Nasopharyngeal Nasopharyngeal Swab     Status: None   Collection Time: 04/24/19 12:13 AM   Specimen: Nasopharyngeal Swab  Result Value Ref Range Status   SARS Coronavirus 2 NEGATIVE NEGATIVE Final    Comment: (NOTE) SARS-CoV-2 target nucleic acids are NOT DETECTED. The SARS-CoV-2 RNA is generally detectable in upper and lower respiratory specimens during the acute phase of infection. Negative results do not preclude SARS-CoV-2 infection, do not rule out co-infections with other pathogens, and should not be used as the sole basis for treatment or other patient management decisions. Negative results must be combined with clinical observations, patient history, and epidemiological information. The expected result is Negative. Fact Sheet for Patients: SugarRoll.be Fact Sheet for Healthcare Providers: https://www.woods-mathews.com/ This test is not yet approved or cleared by the [place] FDA and  has been authorized for detection and/or diagnosis of SARS-CoV-2 by FDA under an Emergency Use Authorization (EUA). This EUA will remain  in effect (meaning this test can be used) for the  duration of the COVID-19 declaration under Section 56 4(b)(1) of the Act, 21 U.S.C. section 360bbb-3(b)(1), unless the authorization is terminated or revoked sooner. Performed at [facility], [place] [address] [place], [place] [address]      Labs: BNP (last 3 results) Recent Labs    06/17/18 2133 12/03/18 0957 12/06/18 0911  BNP 125.4* 296.6* [id number]   Basic Metabolic Panel: Recent Labs  Lab 04/21/19 1746 04/22/19 0149 04/23/19 0539 04/24/19 0525  NA 121* 127* 131* 131*  K 4.0 3.9 3.8 3.9  CL 86* 93* 102 97*  CO2 24 24 23 25   GLUCOSE 157* 129* 88 90  BUN 14 9 13 12   CREATININE 0.51 0.49 0.56 0.56  CALCIUM 9.0 9.2 8.5* 8.9   Liver Function Tests: Recent Labs  Lab 04/21/19 1746 04/22/19 0149  AST 17 17  ALT 12 15  ALKPHOS 100 102  BILITOT 0.5 0.3  PROT 8.0 7.7  ALBUMIN 3.7 3.7   Recent Labs  Lab 04/21/19 1746  LIPASE 32   No results for input(s): AMMONIA in the last 168 hours. CBC: Recent Labs  Lab 04/21/19 1746 04/22/19 0149  WBC 9.9 8.5  HGB 15.3* 16.1*  HCT 44.2 47.6*  MCV 82.6 83.8  PLT 204 219   Cardiac Enzymes: No results for input(s): CKTOTAL, CKMB, CKMBINDEX, TROPONINI in the last 168 hours. BNP: Invalid input(s): POCBNP CBG: No results for input(s): GLUCAP in the last 168 hours. D-Dimer No results for input(s): DDIMER in the last 72 hours. Hgb A1c No results for input(s): HGBA1C in the last 72 hours. Lipid Profile No results for input(s): CHOL, HDL, LDLCALC, TRIG, CHOLHDL, LDLDIRECT in the last 72 hours. Thyroid function studies No results for input(s): TSH, T4TOTAL, T3FREE, THYROIDAB in the last 72 hours.  Invalid input(s): FREET3 Anemia work up No results for input(s): VITAMINB12, FOLATE, FERRITIN, TIBC, IRON, RETICCTPCT in the last 72 hours. Urinalysis    Component Value Date/Time   COLORURINE STRAW (A) 04/21/2019 1746   APPEARANCEUR CLEAR 04/21/2019 1746   APPEARANCEUR Clear 06/22/2012 0618   LABSPEC 1.003 (L)  04/21/2019 1746   LABSPEC 1.003 06/22/2012 0618   PHURINE 8.0 04/21/2019 1746   GLUCOSEU NEGATIVE 04/21/2019 1746  GLUCOSEU Negative 06/22/2012 0618   GLUCOSEU NEGATIVE 04/09/2011 1058   HGBUR SMALL (A) 04/21/2019 1746   BILIRUBINUR NEGATIVE 04/21/2019 1746   BILIRUBINUR Negative 06/22/2012 0618   KETONESUR NEGATIVE 04/21/2019 1746   PROTEINUR NEGATIVE 04/21/2019 1746   UROBILINOGEN 1.0 08/18/2011 1805   NITRITE NEGATIVE 04/21/2019 1746   LEUKOCYTESUR NEGATIVE 04/21/2019 1746   LEUKOCYTESUR Trace 06/22/2012 0618   Sepsis Labs Invalid input(s): PROCALCITONIN,  WBC,  LACTICIDVEN Microbiology Recent Results (from the past 240 hour(s))  SARS CORONAVIRUS 2 (TAT 6-24 HRS) Nasopharyngeal Urine, Clean Catch     Status: None   Collection Time: 04/21/19 10:16 PM   Specimen: Urine, Clean Catch; Nasopharyngeal  Result Value Ref Range Status   SARS Coronavirus 2 NEGATIVE NEGATIVE Final    Comment: (NOTE) SARS-CoV-2 target nucleic acids are NOT DETECTED. The SARS-CoV-2 RNA is generally detectable in upper and lower respiratory specimens during the acute phase of infection. Negative results do not preclude SARS-CoV-2 infection, do not rule out co-infections with other pathogens, and should not be used as the sole basis for treatment or other patient management decisions. Negative results must be combined with clinical observations, patient history, and epidemiological information. The expected result is Negative. Fact Sheet for Patients: SugarRoll.be Fact Sheet for Healthcare Providers: https://www.woods-mathews.com/ This test is not yet approved or cleared by the [place] FDA and  has been authorized for detection and/or diagnosis of SARS-CoV-2 by FDA under an Emergency Use Authorization (EUA). This EUA will remain  in effect (meaning this test can be used) for the duration of the COVID-19 declaration under Section 56 4(b)(1) of the Act, 21  U.S.C. section 360bbb-3(b)(1), unless the authorization is terminated or revoked sooner. Performed at [facility], [place] [address], [place], [place] [address]   MRSA PCR Screening     Status: Abnormal   Collection Time: 04/22/19  2:31 AM   Specimen: Nasal Mucosa; Nasopharyngeal  Result Value Ref Range Status   MRSA by PCR POSITIVE (A) NEGATIVE Final    Comment:        The GeneXpert MRSA Assay (FDA approved for NASAL specimens only), is one component of a comprehensive MRSA colonization surveillance program. It is not intended to diagnose MRSA infection nor to guide or monitor treatment for MRSA infections. RESULT CALLED TO, READ BACK BY AND VERIFIED WITH: Patricia Nettle [id number] @ [id number] [place] Performed at [facility], [place] [address], [place], [place] [address]   SARS CORONAVIRUS 2 (TAT 6-24 HRS) Nasopharyngeal Nasopharyngeal Swab     Status: None   Collection Time: 04/24/19 12:13 AM   Specimen: Nasopharyngeal Swab  Result Value Ref Range Status   SARS Coronavirus 2 NEGATIVE NEGATIVE Final    Comment: (NOTE) SARS-CoV-2 target nucleic acids are NOT DETECTED. The SARS-CoV-2 RNA is generally detectable in upper and lower respiratory specimens during the acute phase of infection. Negative results do not preclude SARS-CoV-2 infection, do not rule out co-infections with other pathogens, and should not be used as the sole basis for treatment or other patient management decisions. Negative results must be combined with clinical observations, patient history, and epidemiological information. The expected result is Negative. Fact Sheet for Patients: Sugar[employer] Fact Sheet for Healthcare Providers: https://www.woods-mathews.com/ This test is not yet approved or cleared by the [place] FDA and  has been authorized for detection and/or diagnosis of SARS-CoV-2 by FDA under an Emergency Use Authorization  (EUA). This EUA will remain  in effect (meaning this test can be  used) for the duration of the COVID-19 declaration under Section 56 4(b)(1) of the Act, 21 U.S.C. section 360bbb-3(b)(1), unless the authorization is terminated or revoked sooner. Performed at [facility], [place] [address], [place], [place] [address]      Time coordinating discharge: Over 30 minutes  SIGNED:   Fredrik Mogel J [place] DO  [facility] 04/24/2019, 2:00 PM

## 2019-05-01 ENCOUNTER — Observation Stay (HOSPITAL_COMMUNITY): Payer: Medicare Other

## 2019-05-01 ENCOUNTER — Emergency Department (HOSPITAL_COMMUNITY): Payer: Medicare Other

## 2019-05-01 ENCOUNTER — Encounter (HOSPITAL_COMMUNITY): Payer: Self-pay | Admitting: Internal Medicine

## 2019-05-01 ENCOUNTER — Inpatient Hospital Stay (HOSPITAL_COMMUNITY)
Admission: EM | Admit: 2019-05-01 | Discharge: 2019-05-15 | DRG: 304 | Disposition: A | Discharge status: Skilled Nursing Facility | Payer: Medicare Other | Source: Skilled Nursing Facility | Attending: Internal Medicine | Admitting: Internal Medicine

## 2019-05-01 DIAGNOSIS — Z79899 Other long term (current) drug therapy: Secondary | ICD-10-CM

## 2019-05-01 DIAGNOSIS — I161 Hypertensive emergency: Secondary | ICD-10-CM | POA: Diagnosis not present

## 2019-05-01 DIAGNOSIS — Z881 Allergy status to other antibiotic agents status: Secondary | ICD-10-CM

## 2019-05-01 DIAGNOSIS — R471 Dysarthria and anarthria: Secondary | ICD-10-CM | POA: Diagnosis present

## 2019-05-01 DIAGNOSIS — E669 Obesity, unspecified: Secondary | ICD-10-CM | POA: Diagnosis present

## 2019-05-01 DIAGNOSIS — E785 Hyperlipidemia, unspecified: Secondary | ICD-10-CM | POA: Diagnosis present

## 2019-05-01 DIAGNOSIS — K219 Gastro-esophageal reflux disease without esophagitis: Secondary | ICD-10-CM | POA: Diagnosis present

## 2019-05-01 DIAGNOSIS — F17210 Nicotine dependence, cigarettes, uncomplicated: Secondary | ICD-10-CM | POA: Diagnosis present

## 2019-05-01 DIAGNOSIS — G9341 Metabolic encephalopathy: Secondary | ICD-10-CM | POA: Diagnosis present

## 2019-05-01 DIAGNOSIS — I169 Hypertensive crisis, unspecified: Secondary | ICD-10-CM | POA: Diagnosis present

## 2019-05-01 DIAGNOSIS — M810 Age-related osteoporosis without current pathological fracture: Secondary | ICD-10-CM | POA: Diagnosis present

## 2019-05-01 DIAGNOSIS — J9611 Chronic respiratory failure with hypoxia: Secondary | ICD-10-CM | POA: Diagnosis present

## 2019-05-01 DIAGNOSIS — Z880 Allergy status to penicillin: Secondary | ICD-10-CM

## 2019-05-01 DIAGNOSIS — R9431 Abnormal electrocardiogram [ECG] [EKG]: Secondary | ICD-10-CM | POA: Diagnosis present

## 2019-05-01 DIAGNOSIS — Z6831 Body mass index (BMI) 31.0-31.9, adult: Secondary | ICD-10-CM

## 2019-05-01 DIAGNOSIS — E119 Type 2 diabetes mellitus without complications: Secondary | ICD-10-CM | POA: Diagnosis present

## 2019-05-01 DIAGNOSIS — Z20828 Contact with and (suspected) exposure to other viral communicable diseases: Secondary | ICD-10-CM | POA: Diagnosis present

## 2019-05-01 DIAGNOSIS — F419 Anxiety disorder, unspecified: Secondary | ICD-10-CM | POA: Diagnosis present

## 2019-05-01 DIAGNOSIS — D751 Secondary polycythemia: Secondary | ICD-10-CM | POA: Diagnosis present

## 2019-05-01 DIAGNOSIS — J449 Chronic obstructive pulmonary disease, unspecified: Secondary | ICD-10-CM | POA: Diagnosis present

## 2019-05-01 DIAGNOSIS — I5032 Chronic diastolic (congestive) heart failure: Secondary | ICD-10-CM | POA: Diagnosis present

## 2019-05-01 DIAGNOSIS — E871 Hypo-osmolality and hyponatremia: Secondary | ICD-10-CM | POA: Diagnosis present

## 2019-05-01 DIAGNOSIS — R29705 NIHSS score 5: Secondary | ICD-10-CM | POA: Diagnosis present

## 2019-05-01 DIAGNOSIS — I5043 Acute on chronic combined systolic (congestive) and diastolic (congestive) heart failure: Secondary | ICD-10-CM | POA: Diagnosis present

## 2019-05-01 DIAGNOSIS — R4701 Aphasia: Secondary | ICD-10-CM

## 2019-05-01 DIAGNOSIS — Z993 Dependence on wheelchair: Secondary | ICD-10-CM

## 2019-05-01 DIAGNOSIS — F250 Schizoaffective disorder, bipolar type: Secondary | ICD-10-CM | POA: Diagnosis present

## 2019-05-01 DIAGNOSIS — Z7982 Long term (current) use of aspirin: Secondary | ICD-10-CM

## 2019-05-01 DIAGNOSIS — Z7951 Long term (current) use of inhaled steroids: Secondary | ICD-10-CM

## 2019-05-01 DIAGNOSIS — I110 Hypertensive heart disease with heart failure: Secondary | ICD-10-CM | POA: Diagnosis present

## 2019-05-01 LAB — COMPREHENSIVE METABOLIC PANEL
ALT: 18 U/L (ref 0–44)
AST: 19 U/L (ref 15–41)
Albumin: 3.9 g/dL (ref 3.5–5.0)
Alkaline Phosphatase: 78 U/L (ref 38–126)
Anion gap: 15 (ref 5–15)
BUN: 16 mg/dL (ref 6–20)
CO2: 21 mmol/L — ABNORMAL LOW (ref 22–32)
Calcium: 9.9 mg/dL (ref 8.9–10.3)
Chloride: 97 mmol/L — ABNORMAL LOW (ref 98–111)
Creatinine, Ser: 0.58 mg/dL (ref 0.44–1.00)
GFR calc Af Amer: >60 mL/min (ref >60)
GFR calc non Af Amer: >60 mL/min (ref >60)
Glucose, Bld: 109 mg/dL — ABNORMAL HIGH (ref 70–99)
Potassium: 3.8 mmol/L (ref 3.5–5.1)
Sodium: 133 mmol/L — ABNORMAL LOW (ref 135–145)
Total Bilirubin: 0.9 mg/dL (ref 0.3–1.2)
Total Protein: 7.9 g/dL (ref 6.5–8.1)

## 2019-05-01 LAB — CBG MONITORING, ED: Glucose-Capillary: 103 mg/dL — ABNORMAL HIGH (ref 70–99)

## 2019-05-01 LAB — DIFFERENTIAL
Abs Immature Granulocytes: 0.04 10*3/uL (ref 0.00–0.07)
Basophils Absolute: 0 10*3/uL (ref 0.0–0.1)
Basophils Relative: 0 %
Eosinophils Absolute: 0 10*3/uL (ref 0.0–0.5)
Eosinophils Relative: 0 %
Immature Granulocytes: 1 %
Lymphocytes Relative: 15 %
Lymphs Abs: 1.3 10*3/uL (ref 0.7–4.0)
Monocytes Absolute: 0.6 10*3/uL (ref 0.1–1.0)
Monocytes Relative: 7 %
Neutro Abs: 6.5 10*3/uL (ref 1.7–7.7)
Neutrophils Relative %: 77 %

## 2019-05-01 LAB — I-STAT CHEM 8, ED
BUN: 24 mg/dL — ABNORMAL HIGH (ref 6–20)
Calcium, Ion: 1.11 mmol/L — ABNORMAL LOW (ref 1.15–1.40)
Chloride: 102 mmol/L (ref 98–111)
Creatinine, Ser: 0.5 mg/dL (ref 0.44–1.00)
Glucose, Bld: 105 mg/dL — ABNORMAL HIGH (ref 70–99)
HCT: 50 % — ABNORMAL HIGH (ref 36.0–46.0)
Hemoglobin: 17 g/dL — ABNORMAL HIGH (ref 12.0–15.0)
Potassium: 4.3 mmol/L (ref 3.5–5.1)
Sodium: 135 mmol/L (ref 135–145)
TCO2: 23 mmol/L (ref 22–32)

## 2019-05-01 LAB — I-STAT BETA HCG BLOOD, ED (MC, WL, AP ONLY): I-stat hCG, quantitative: <5 m[IU]/mL (ref <5)

## 2019-05-01 LAB — SARS CORONAVIRUS 2 (TAT 6-24 HRS): SARS Coronavirus 2: NEGATIVE

## 2019-05-01 LAB — CBC
HCT: 47.7 % — ABNORMAL HIGH (ref 36.0–46.0)
Hemoglobin: 15.9 g/dL — ABNORMAL HIGH (ref 12.0–15.0)
MCH: 28.5 pg (ref 26.0–34.0)
MCHC: 33.3 g/dL (ref 30.0–36.0)
MCV: 85.5 fL (ref 80.0–100.0)
Platelets: 205 10*3/uL (ref 150–400)
RBC: 5.58 MIL/uL — ABNORMAL HIGH (ref 3.87–5.11)
RDW: 13.6 % (ref 11.5–15.5)
WBC: 8.5 10*3/uL (ref 4.0–10.5)
nRBC: 0 % (ref 0.0–0.2)

## 2019-05-01 LAB — PROTIME-INR
INR: 1 (ref 0.8–1.2)
Prothrombin Time: 13.3 seconds (ref 11.4–15.2)

## 2019-05-01 LAB — APTT: aPTT: 30 seconds (ref 24–36)

## 2019-05-01 MED ORDER — GUAIFENESIN 100 MG/5ML PO SYRP
200.0000 mg | ORAL_SOLUTION | Freq: Three times a day (TID) | ORAL | Status: DC | PRN
  Filled 2019-05-01: qty 10

## 2019-05-01 MED ORDER — ENOXAPARIN SODIUM 40 MG/0.4ML ~~LOC~~ SOLN
40.0000 mg | SUBCUTANEOUS | Status: DC
  Administered 2019-05-01 – 2019-05-14 (×14): 40 mg via SUBCUTANEOUS
  Filled 2019-05-01 (×14): qty 0.4

## 2019-05-01 MED ORDER — LABETALOL HCL 5 MG/ML IV SOLN
10.0000 mg | Freq: Once | INTRAVENOUS | Status: AC
  Administered 2019-05-01: 15:00:00 10 mg via INTRAVENOUS
  Filled 2019-05-01: qty 4

## 2019-05-01 MED ORDER — LURASIDONE HCL 40 MG PO TABS
40.0000 mg | ORAL_TABLET | Freq: Two times a day (BID) | ORAL | Status: DC
  Administered 2019-05-01 – 2019-05-04 (×6): 40 mg via ORAL
  Filled 2019-05-01 (×6): qty 1

## 2019-05-01 MED ORDER — MELATONIN 3 MG PO TABS
6.0000 mg | ORAL_TABLET | Freq: Every day | ORAL | Status: DC
  Administered 2019-05-01 – 2019-05-14 (×14): 6 mg via ORAL
  Filled 2019-05-01 (×15): qty 2

## 2019-05-01 MED ORDER — QUETIAPINE FUMARATE 100 MG PO TABS
100.0000 mg | ORAL_TABLET | Freq: Two times a day (BID) | ORAL | Status: DC
  Administered 2019-05-01: 100 mg via ORAL
  Filled 2019-05-01: qty 2
  Filled 2019-05-01 (×2): qty 1

## 2019-05-01 MED ORDER — IPRATROPIUM-ALBUTEROL 0.5-2.5 (3) MG/3ML IN SOLN
3.0000 mL | Freq: Three times a day (TID) | RESPIRATORY_TRACT | Status: DC
  Administered 2019-05-02: 10:00:00 3 mL via RESPIRATORY_TRACT
  Filled 2019-05-01: qty 3

## 2019-05-01 MED ORDER — PRO-STAT SUGAR FREE PO LIQD
30.0000 mL | Freq: Two times a day (BID) | ORAL | Status: DC
  Administered 2019-05-01 – 2019-05-15 (×28): 30 mL via ORAL
  Filled 2019-05-01 (×28): qty 30

## 2019-05-01 MED ORDER — ONDANSETRON HCL 4 MG PO TABS: 4.0000 mg | ORAL_TABLET | Freq: Four times a day (QID) | ORAL | Status: DC | PRN

## 2019-05-01 MED ORDER — ALBUTEROL SULFATE (2.5 MG/3ML) 0.083% IN NEBU: 3.0000 mL | INHALATION_SOLUTION | RESPIRATORY_TRACT | Status: DC | PRN

## 2019-05-01 MED ORDER — PANTOPRAZOLE SODIUM 40 MG PO TBEC
40.0000 mg | DELAYED_RELEASE_TABLET | Freq: Every day | ORAL | Status: DC
  Administered 2019-05-01 – 2019-05-14 (×14): 40 mg via ORAL
  Filled 2019-05-01 (×14): qty 1

## 2019-05-01 MED ORDER — BUPROPION HCL ER (XL) 150 MG PO TB24
150.0000 mg | ORAL_TABLET | Freq: Every day | ORAL | Status: DC
  Administered 2019-05-02 – 2019-05-04 (×3): 150 mg via ORAL
  Filled 2019-05-01 (×3): qty 1

## 2019-05-01 MED ORDER — FLUTICASONE FUROATE-VILANTEROL 100-25 MCG/INH IN AEPB
1.0000 | INHALATION_SPRAY | Freq: Every day | RESPIRATORY_TRACT | Status: DC
  Administered 2019-05-03 – 2019-05-15 (×9): 1 via RESPIRATORY_TRACT
  Filled 2019-05-01: qty 28

## 2019-05-01 MED ORDER — IPRATROPIUM-ALBUTEROL 20-100 MCG/ACT IN AERS: 1.0000 | INHALATION_SPRAY | Freq: Four times a day (QID) | RESPIRATORY_TRACT | Status: DC

## 2019-05-01 MED ORDER — HYDRALAZINE HCL 20 MG/ML IJ SOLN
5.0000 mg | INTRAMUSCULAR | Status: DC | PRN
  Administered 2019-05-01 – 2019-05-08 (×6): 5 mg via INTRAVENOUS
  Filled 2019-05-01 (×7): qty 1

## 2019-05-01 MED ORDER — DOCUSATE SODIUM 100 MG PO CAPS
100.0000 mg | ORAL_CAPSULE | Freq: Two times a day (BID) | ORAL | Status: DC
  Administered 2019-05-01 – 2019-05-15 (×24): 100 mg via ORAL
  Filled 2019-05-01 (×24): qty 1

## 2019-05-01 MED ORDER — LISINOPRIL 20 MG PO TABS
40.0000 mg | ORAL_TABLET | Freq: Every day | ORAL | Status: DC
  Administered 2019-05-02 – 2019-05-15 (×14): 40 mg via ORAL
  Filled 2019-05-01 (×14): qty 2

## 2019-05-01 MED ORDER — METHOCARBAMOL 500 MG PO TABS: 500.0000 mg | ORAL_TABLET | Freq: Three times a day (TID) | ORAL | Status: DC | PRN

## 2019-05-01 MED ORDER — ONDANSETRON HCL 4 MG/2ML IJ SOLN
4.0000 mg | Freq: Four times a day (QID) | INTRAMUSCULAR | Status: DC | PRN
  Administered 2019-05-04: 08:00:00 4 mg via INTRAVENOUS
  Filled 2019-05-01: qty 2

## 2019-05-01 MED ORDER — VALPROIC ACID 250 MG PO CAPS
500.0000 mg | ORAL_CAPSULE | Freq: Two times a day (BID) | ORAL | Status: DC
  Administered 2019-05-01 – 2019-05-15 (×28): 500 mg via ORAL
  Filled 2019-05-01 (×29): qty 2

## 2019-05-01 MED ORDER — LABETALOL HCL 5 MG/ML IV SOLN
INTRAVENOUS | Status: AC
  Administered 2019-05-01: 2.5 mg
  Filled 2019-05-01: qty 4

## 2019-05-01 MED ORDER — LORATADINE 10 MG PO TABS
10.0000 mg | ORAL_TABLET | Freq: Every day | ORAL | Status: DC
  Administered 2019-05-02 – 2019-05-15 (×14): 10 mg via ORAL
  Filled 2019-05-01 (×14): qty 1

## 2019-05-01 MED ORDER — NIFEDIPINE ER OSMOTIC RELEASE 90 MG PO TB24
90.0000 mg | ORAL_TABLET | Freq: Every day | ORAL | Status: DC
  Administered 2019-05-02 – 2019-05-15 (×14): 90 mg via ORAL
  Filled 2019-05-01 (×14): qty 1

## 2019-05-01 MED ORDER — OXYCODONE HCL 5 MG PO TABS
5.0000 mg | ORAL_TABLET | Freq: Two times a day (BID) | ORAL | Status: DC | PRN
  Administered 2019-05-03: 5 mg via ORAL
  Filled 2019-05-01: qty 1

## 2019-05-01 MED ORDER — IOHEXOL 350 MG/ML SOLN
100.0000 mL | Freq: Once | INTRAVENOUS | Status: AC | PRN
  Administered 2019-05-01: 12:00:00 100 mL via INTRAVENOUS

## 2019-05-01 MED ORDER — ENALAPRILAT 1.25 MG/ML IV SOLN
1.2500 mg | Freq: Once | INTRAVENOUS | Status: AC
  Administered 2019-05-01: 1.25 mg via INTRAVENOUS
  Filled 2019-05-01: qty 1

## 2019-05-01 MED ORDER — GABAPENTIN 100 MG PO CAPS
100.0000 mg | ORAL_CAPSULE | Freq: Every day | ORAL | Status: DC
  Administered 2019-05-02 – 2019-05-04 (×3): 100 mg via ORAL
  Filled 2019-05-01 (×3): qty 1

## 2019-05-01 MED ORDER — SODIUM CHLORIDE 1 G PO TABS
1.0000 g | ORAL_TABLET | Freq: Three times a day (TID) | ORAL | Status: DC
  Administered 2019-05-02 – 2019-05-15 (×41): 1 g via ORAL
  Filled 2019-05-01 (×43): qty 1

## 2019-05-01 MED ORDER — ACETAMINOPHEN 325 MG PO TABS
650.0000 mg | ORAL_TABLET | Freq: Four times a day (QID) | ORAL | Status: DC | PRN
  Administered 2019-05-12: 650 mg via ORAL
  Filled 2019-05-01: qty 2

## 2019-05-01 MED ORDER — METOPROLOL TARTRATE 25 MG PO TABS
25.0000 mg | ORAL_TABLET | Freq: Two times a day (BID) | ORAL | Status: DC
  Administered 2019-05-01 – 2019-05-15 (×27): 25 mg via ORAL
  Filled 2019-05-01 (×28): qty 1

## 2019-05-01 MED ORDER — FLUTICASONE PROPIONATE 50 MCG/ACT NA SUSP
2.0000 | Freq: Two times a day (BID) | NASAL | Status: DC
  Administered 2019-05-01 – 2019-05-15 (×28): 2 via NASAL
  Filled 2019-05-01: qty 16

## 2019-05-01 MED ORDER — ACETAMINOPHEN 650 MG RE SUPP: 650.0000 mg | Freq: Four times a day (QID) | RECTAL | Status: DC | PRN

## 2019-05-01 MED ORDER — ENSURE ENLIVE PO LIQD
237.0000 mL | ORAL | Status: DC
  Administered 2019-05-04 – 2019-05-15 (×11): 237 mL via ORAL
  Filled 2019-05-01: qty 237

## 2019-05-01 MED ORDER — ASPIRIN EC 81 MG PO TBEC
81.0000 mg | DELAYED_RELEASE_TABLET | Freq: Every day | ORAL | Status: DC
  Administered 2019-05-02 – 2019-05-15 (×14): 81 mg via ORAL
  Filled 2019-05-01 (×14): qty 1

## 2019-05-01 MED ORDER — HYDROXYZINE HCL 25 MG PO TABS
25.0000 mg | ORAL_TABLET | Freq: Three times a day (TID) | ORAL | Status: DC
  Administered 2019-05-01: 25 mg via ORAL
  Filled 2019-05-01: qty 1

## 2019-05-01 MED ORDER — IPRATROPIUM-ALBUTEROL 0.5-2.5 (3) MG/3ML IN SOLN
3.0000 mL | Freq: Four times a day (QID) | RESPIRATORY_TRACT | Status: DC
  Administered 2019-05-01: 3 mL via RESPIRATORY_TRACT
  Filled 2019-05-01: qty 3

## 2019-05-01 MED ORDER — SODIUM CHLORIDE 0.9% FLUSH: 3.0000 mL | Freq: Once | INTRAVENOUS | Status: DC

## 2019-05-01 MED ORDER — ALPRAZOLAM 0.25 MG PO TABS
0.5000 mg | ORAL_TABLET | Freq: Two times a day (BID) | ORAL | Status: DC
  Administered 2019-05-01 – 2019-05-03 (×5): 0.5 mg via ORAL
  Filled 2019-05-01 (×5): qty 2

## 2019-05-01 MED ORDER — GUAIFENESIN ER 600 MG PO TB12
1200.0000 mg | ORAL_TABLET | Freq: Every day | ORAL | Status: DC
  Administered 2019-05-02 – 2019-05-15 (×14): 1200 mg via ORAL
  Filled 2019-05-01 (×14): qty 2

## 2019-05-01 MED ORDER — POLYETHYLENE GLYCOL 3350 17 G PO PACK
17.0000 g | PACK | Freq: Every day | ORAL | Status: DC
  Administered 2019-05-03 – 2019-05-15 (×10): 17 g via ORAL
  Filled 2019-05-01 (×11): qty 1

## 2019-05-01 NOTE — Progress Notes (Signed)
EEG Completed; Results Pending  

## 2019-05-01 NOTE — ED Notes (Signed)
Pt arrives from facility with complaints of hypertension and stroke like symptoms. EMS reports pt vital sighs were being checked by facility staff, during which the patient became unable to speak or form words. Pt at baseline is able to carry on conversations.

## 2019-05-01 NOTE — ED Notes (Signed)
Code stroke discontinued per Dr Earnest Bailey.

## 2019-05-01 NOTE — ED Notes (Signed)
Diet tray ordered 

## 2019-05-01 NOTE — H&P (Signed)
History and Physical    Natalie Thomas [id number] DOB: 1961-01-27 DOA: 05/01/2019  PCP: System, Pcp Not In Consultants:  None Patient coming from: [facility]; [facility]: Legal Lyda Jester Leeper, [phone]  Chief Complaint: speech difficulty  HPI: Natalie Thomas is a 58 y.o. female with medical history significant of bipolar/schizoaffective d/o;  HTN; HLD: DM; COPD; chronic hyponatremia; and CHF presenting with difficulty talking.  At baseline at the time of last discharge, she was calm and comfortable, "pleasantly confused, nonsensical speech."  She is currently alert and does not answer questions, but she heard someone yelling down the hall and repeatedly asked what was going on.  She was last admitted from 10/27-30 with malignant hypertensive urgency.  She was started on a labetalol drip and increased her lisinopril.  Lasix dose was decreased due to chronic hyponatremia.  ED Course:  ?hypertensive crisis.  Presented as code stroke with aphasia.  MRI negative.  BP in 200s.  Neuro thinks possibly related to BP.  Had prn Labetalol and in 170s.  Review of Systems: Unable to perform   Past Medical History:  Diagnosis Date   Anemia 10/16/2017   Anxiety and depression    CHF (congestive heart failure) (HCC)    Chronic hyponatremia 04/08/2007   Qualifier: Diagnosis of  By: Marca Ancona RMA, Lucy     Closed comminuted intertrochanteric fracture of right femur ([place]) 11/07/2017   COPD 04/08/2007   Qualifier: Diagnosis of  By: Marca Ancona RMA, Lucy     Depression    Diabetes mellitus, type 2 ([place])    pt denies but states that she has been treated for DM   Essential hypertension 04/08/2007   Qualifier: Diagnosis of  By: Marca Ancona RMA, Lucy     GERD (gastroesophageal reflux disease)    Hyperlipidemia 10/07/2017   Lumbar disc disease 04/09/2011   MENOPAUSE, PREMATURE 04/08/2007   Qualifier: Diagnosis of  By: Marca Ancona RMA, Lucy     NEOPLASM, MALIGNANT, VULVA  04/08/2007   Qualifier: Diagnosis of  By: Marca Ancona RMA, Lucy     Osteoporosis 04/08/2007   Qualifier: Diagnosis of  By: Marca Ancona RMA, Lucy     Polycythemia secondary to smoking 04/09/2011   Schizoaffective disorder, bipolar type ([place]) 07/05/2011    Past Surgical History:  Procedure Laterality Date   EYE SURGERY     on left  eye, pt states that she sees bad out of the right eye and needs surgery there   INTRAMEDULLARY (IM) NAIL INTERTROCHANTERIC Right 11/13/2017   Procedure: INTRAMEDULLARY (IM) NAIL INTERTROCHANTRIC;  Surgeon: Shona Needles, MD;  Location: [place];  Service: Orthopedics;  Laterality: Right;   PELVIC FRACTURE SURGERY      Social History   Socioeconomic History   Marital status: Divorced    Spouse name: Not on file   Number of children: Not on file   Years of education: Not on file   Highest education level: Not on file  Occupational History   Occupation: Disabled  Social Designer, fashion/clothing strain: Not on file   Food insecurity    Worry: Not on file    Inability: Not on file   Transportation needs    Medical: Not on file    Non-medical: Not on file  Tobacco Use   Smoking status: Current Every Day Smoker    Packs/day: 0.25    Types: Cigarettes   Smokeless tobacco: Never Used  Substance and Sexual Activity   Alcohol use: No   Drug use:  No   Sexual activity: Never  Lifestyle   Physical activity    Days per week: Not on file    Minutes per session: Not on file   Stress: Not on file  Relationships   Social connections    Talks on phone: Not on file    Gets together: Not on file    Attends religious service: Not on file    Active member of club or organization: Not on file    Attends meetings of clubs or organizations: Not on file    Relationship status: Not on file   Intimate partner violence    Fear of current or ex partner: Not on file    Emotionally abused: Not on file    Physically abused: Not on file    Forced sexual  activity: Not on file  Other Topics Concern   Not on file  Social History Narrative   single    Allergies  Allergen Reactions   Clarithromycin Itching   Penicillins Itching    Has tolerated cefazolin before  Has patient had a PCN reaction causing immediate rash, facial/tongue/throat swelling, SOB or lightheadedness with hypotension: No Has patient had a PCN reaction causing severe rash involving mucus membranes or skin necrosis: No Has patient had a PCN reaction that required hospitalization: Unknown Has patient had a PCN reaction occurring within the last 10 years: No If all of the above answers are "NO", then may proceed with Cephalosporin use.     No family history on file.  Prior to Admission medications   Medication Sig Start Date End Date Taking? Authorizing Provider  acetaminophen (TYLENOL) 325 MG tablet Take 650 mg by mouth 2 (two) times daily as needed (pain). Do not exceed 3 grams/24 hours of tylenol from all sources    [provider]  acetaminophen (TYLENOL) 500 MG tablet Take 500 mg by mouth every 6 (six) hours as needed for fever.    [provider]  albuterol (PROVENTIL) (2.5 MG/3ML) 0.083% nebulizer solution Take 3 mLs (2.5 mg total) by nebulization every 4 (four) hours as needed for wheezing or shortness of breath. 12/11/18   Swayze, Ava, DO  ALPRAZolam (XANAX) 0.5 MG tablet Take 1 tablet (0.5 mg total) by mouth 2 (two) times daily. 04/24/19   [place] Donnamarie Poag, DO  aluminum-magnesium hydroxide-simethicone (MAALOX) 200-200-20 MG/5ML SUSP Take 30 mLs by mouth 4 (four) times daily as needed (heartburn, indigestion).    [provider]  Amino Acids-Protein Hydrolys (FEEDING SUPPLEMENT, PRO-STAT SUGAR FREE 64,) LIQD Take 30 mLs by mouth 2 (two) times daily. 03/14/19   Desiree Hane, MD  aspirin 81 MG tablet Take 1 tablet (81 mg total) by mouth daily. For heart health 08/06/11   Greig Castilla, FNP  benzonatate (TESSALON) 100 MG capsule Take  1 capsule (100 mg total) by mouth 3 (three) times daily as needed for cough. 12/11/18   Swayze, Ava, DO  buPROPion (WELLBUTRIN XL) 150 MG 24 hr tablet Take 150 mg by mouth daily.    [provider]  calcium carbonate (TUMS - DOSED IN MG ELEMENTAL CALCIUM) 500 MG chewable tablet Chew 1 tablet (200 mg of elemental calcium total) by mouth 2 (two) times daily as needed for indigestion or heartburn. 12/11/18   Swayze, Ava, DO  docusate sodium (COLACE) 100 MG capsule Take 100 mg by mouth 2 (two) times daily as needed (constipation).     [provider]  docusate sodium (COLACE) 100 MG capsule Take 100 mg  by mouth 2 (two) times daily. (scheduled)    [provider]  feeding supplement, ENSURE ENLIVE, (ENSURE ENLIVE) LIQD Take 237 mLs by mouth daily. 03/14/19   Desiree Hane, MD  fluticasone (FLONASE) 50 MCG/ACT nasal spray Place 2 sprays into both nostrils 2 (two) times daily.    [provider]  fluticasone furoate-vilanterol (BREO ELLIPTA) 100-25 MCG/INH AEPB Inhale 1 puff into the lungs daily.    [provider]  furosemide (LASIX) 20 MG tablet Take 1 tablet (20 mg total) by mouth daily. 04/25/19 05/25/19  [place] Donnamarie Poag, DO  gabapentin (NEURONTIN) 100 MG capsule Take 1 capsule (100 mg total) by mouth at bedtime. Patient taking differently: Take 100 mg by mouth daily.  12/11/18   Swayze, Ava, DO  guaiFENesin (MUCINEX) 600 MG 12 hr tablet Take 1,200 mg by mouth daily.    [provider]  guaifenesin (ROBAFEN) 100 MG/5ML syrup Take 200 mg by mouth 3 (three) times daily as needed for cough or congestion.    [provider]  hydrOXYzine (ATARAX/VISTARIL) 25 MG tablet Take 1 tablet (25 mg total) by mouth 3 (three) times daily as needed for anxiety. Patient taking differently: Take 25 mg by mouth 3 (three) times daily.  10/19/17   Aline August, MD  ipratropium-albuterol (DUONEB) 0.5-2.5 (3) MG/3ML SOLN Take 3 mLs by nebulization See admin  instructions. Inhale one vial via hand held nebulizer three times daily, may also use every 8 hours as needed for shortness of breath    [provider]  lisinopril (ZESTRIL) 40 MG tablet Take 1 tablet (40 mg total) by mouth daily. 04/25/19 05/25/19  [place] Donnamarie Poag, DO  loratadine (CLARITIN) 10 MG tablet Take 10 mg by mouth daily.    [provider]  lurasidone (LATUDA) 40 MG TABS tablet Take 1 tablet (40 mg total) by mouth 2 (two) times a day. 12/18/18   Kayleen Memos, DO  Melatonin 5 MG TABS Take 5 mg by mouth at bedtime.    [provider]  methocarbamol (ROBAXIN) 500 MG tablet Take 500 mg by mouth every 8 (eight) hours as needed for muscle spasms.    [provider]  methocarbamol (ROBAXIN) 500 MG tablet Take 500 mg by mouth daily. (scheduled)    [provider]  metoprolol tartrate (LOPRESSOR) 25 MG tablet Take 1 tablet (25 mg total) by mouth 2 (two) times daily. 12/17/18   Kayleen Memos, DO  Multiple Vitamin (MULTIVITAMIN WITH MINERALS) TABS tablet Take 1 tablet by mouth daily. 03/15/19   Oretha Milch D, MD  nicotine (NICODERM CQ - DOSED IN MG/24 HOURS) 21 mg/24hr patch Place 1 patch (21 mg total) onto the skin daily. Patient not taking: Reported on 03/09/2019 12/12/18   Swayze, Ava, DO  NIFEdipine (PROCARDIA XL/ADALAT-CC) 90 MG 24 hr tablet Take 90 mg by mouth daily.    [provider]  nystatin (NYSTATIN) powder Apply topically 2 (two) times daily as needed (to reddened area twice daily as needed for irritation).    [provider]  omeprazole (PRILOSEC) 40 MG capsule Take 1 capsule (40 mg total) by mouth 2 (two) times daily. Patient taking differently: Take 40 mg by mouth at bedtime.  03/16/19   Oretha Milch D, MD  oxyCODONE (OXY IR/ROXICODONE) 5 MG immediate release tablet Take 1 tablet (5 mg total) by mouth every 12 (twelve) hours as needed for moderate pain or severe pain. 04/24/19   [place] Eric J, DO  polyethylene glycol  (MIRALAX)  17 g packet Take 17 g by mouth daily. 12/11/18   Swayze, Ava, DO  QUEtiapine (SEROQUEL) 50 MG tablet Take 1 tablet (50 mg total) by mouth at bedtime. Patient taking differently: Take 100 mg by mouth 2 (two) times daily.  12/11/18   Swayze, Ava, DO  sodium chloride 1 g tablet Take 1 tablet (1 g total) by mouth 3 (three) times daily with meals. 12/17/18   Kayleen Memos, DO  valproic acid (DEPAKENE) 250 MG capsule Take 2 capsules (500 mg total) by mouth 2 (two) times daily. 12/11/18   Swayze, Ava, DO  vitamin C (ASCORBIC ACID) 500 MG tablet Take 500 mg by mouth 2 (two) times daily.    [provider]  Zinc Gluconate 100 MG TABS Take 100 mg by mouth daily.    [provider]    Physical Exam: Vitals:   05/01/19 1400 05/01/19 1415 05/01/19 1437 05/01/19 1445  BP: (!) 180/77 (!) 196/84 (!) 211/80 (!) 189/92  Pulse: 85 94 93   Resp: (!) 21 (!) 28 20   Temp:      TempSrc:      SpO2: 93% 97% 95%   Weight:   80 kg   Height:   5\' 3"  (1.6 m)       General:  Appears calm and comfortable and is NAD; nonsensical speech  Eyes:  PERRL, EOMI, normal lids, iris  ENT:  grossly normal hearing, lips & tongue, mmm; edentulous  Neck:  no LAD, masses or thyromegaly  Cardiovascular:  RRR, no m/r/g. No LE edema.   Respiratory:   CTA bilaterally with no wheezes/rales/rhonchi.  Normal respiratory effort.  Abdomen:  soft, NT, ND, NABS  Skin:  no rash or induration seen on limited exam  Musculoskeletal:  grossly normal tone BUE/BLE, good ROM, no bony abnormality  Psychiatric: alert, pleasant, speech that appears to be sensical at times but then other times stares blankly when asked questions or gives nonsensical answers  Neurologic:  Unable to perform    Radiological Exams on Admission: Ct Angio Head W Or Wo Contrast  Result Date: 05/01/2019 CLINICAL DATA:  Pure aphasia.  Dementia EXAM: CT ANGIOGRAPHY HEAD AND NECK CT PERFUSION BRAIN TECHNIQUE: Multidetector CT imaging of  the head and neck was performed using the standard protocol during bolus administration of intravenous contrast. Multiplanar CT image reconstructions and MIPs were obtained to evaluate the vascular anatomy. Carotid stenosis measurements (when applicable) are obtained utilizing NASCET criteria, using the distal internal carotid diameter as the denominator. Multiphase CT imaging of the brain was performed following IV bolus contrast injection. Subsequent parametric perfusion maps were calculated using RAPID software. CONTRAST:  Doses not yet known, studies in progress. COMPARISON:  Noncontrast head CT from earlier today FINDINGS: CTA NECK FINDINGS Aortic arch: Extensive atherosclerotic plaque. Bulky calcified plaque at the proximal left subclavian with high-grade stenosis if not occlusion. There is symmetric enhancement in the subclavians. Right carotid system: Common carotid and proximal ICA atherosclerotic calcification. No flow limiting stenosis. ICA tortuosity with nonstenotic kink. Left carotid system: Scattered atherosclerotic calcification of the common and internal carotids without flow limiting stenosis or ulceration. Vertebral arteries: Severely stenotic or occluded proximal left subclavian with robust reconstitution. 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 Upper chest: Mild dependent atelectasis in the upper lobes. Partially covered ground-glass density in the left upper lobe. Review  of the MIP images confirms the above findings CTA HEAD FINDINGS Anterior circulation: Atherosclerotic plaque on the carotid siphons. No branch occlusion or proximal flow limiting stenosis. Negative for aneurysm. Atherosclerotic irregularity of bilateral medium size branches. Posterior circulation: Atherosclerotic plaque on the left V4 segment  without flow limiting stenosis. The vertebrobasilar arteries are smooth and patent. No PCA branch occlusion or beading. There is a mild to moderate atheromatous type narrowing at the left P3 segment. Negative for aneurysm Venous sinuses: Negative Anatomic variants: None significant Review of the MIP images confirms the above findings CT Brain Perfusion Findings: ASPECTS: 10 CBF (<30%) Volume: 13mL Perfusion (Tmax>6.0s) volume: 38mL IMPRESSION: 1. No emergent large vessel occlusion or infarct/penumbra by CT perfusion. 2. Occlusion or pre occlusion at the proximal left subclavian due to bulky calcified plaque. There is symmetric enhancement of the vertebral and subclavian arteries. 3. Moderate intracranial atherosclerosis without proximal flow limiting stenosis. 4. Possible left upper lobe airspace disease, suggest chest x-ray correlation. Electronically Signed   By: Monte Fantasia M.D.   On: 05/01/2019 12:04   Ct Angio Neck W Or Wo Contrast  Result Date: 05/01/2019 CLINICAL DATA:  Pure aphasia.  Dementia EXAM: CT ANGIOGRAPHY HEAD AND NECK CT PERFUSION BRAIN TECHNIQUE: Multidetector CT imaging of the head and neck was performed using the standard protocol during bolus administration of intravenous contrast. Multiplanar CT image reconstructions and MIPs were obtained to evaluate the vascular anatomy. Carotid stenosis measurements (when applicable) are obtained utilizing NASCET criteria, using the distal internal carotid diameter as the denominator. Multiphase CT imaging of the brain was performed following IV bolus contrast injection. Subsequent parametric perfusion maps were calculated using RAPID software. CONTRAST:  Doses not yet known, studies in progress. COMPARISON:  Noncontrast head CT from earlier today FINDINGS: CTA NECK FINDINGS Aortic arch: Extensive atherosclerotic plaque. Bulky calcified plaque at the proximal left subclavian with high-grade stenosis if not occlusion. There is symmetric enhancement in  the subclavians. Right carotid system: Common carotid and proximal ICA atherosclerotic calcification. No flow limiting stenosis. ICA tortuosity with nonstenotic kink. Left carotid system: Scattered atherosclerotic calcification of the common and internal carotids without flow limiting stenosis or ulceration. Vertebral arteries: Severely stenotic or occluded proximal left subclavian with robust reconstitution. 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 Upper chest: Mild dependent atelectasis in the upper lobes. Partially covered ground-glass density in the left upper lobe. Review of the MIP images confirms the above findings CTA HEAD FINDINGS Anterior circulation: Atherosclerotic plaque on the carotid siphons. No branch occlusion or proximal flow limiting stenosis. Negative for aneurysm. Atherosclerotic irregularity of bilateral medium size branches. Posterior circulation: Atherosclerotic plaque on the left V4 segment without flow limiting stenosis. The vertebrobasilar arteries are smooth and patent. No PCA branch occlusion or beading. There is a mild to moderate atheromatous type narrowing at the left P3 segment. Negative for aneurysm Venous sinuses: Negative Anatomic variants: None significant Review of the MIP images confirms the above findings CT Brain Perfusion Findings: ASPECTS: 10 CBF (<30%) Volume: 3mL Perfusion (Tmax>6.0s) volume: 44mL IMPRESSION: 1. No emergent large vessel occlusion or infarct/penumbra by CT perfusion. 2. Occlusion or pre occlusion at the proximal left subclavian due to bulky calcified plaque. There is symmetric enhancement of the vertebral and subclavian arteries. 3. Moderate intracranial atherosclerosis without proximal flow limiting stenosis. 4. Possible left upper lobe airspace disease, suggest chest x-ray  correlation. Electronically Signed   By: Monte Fantasia M.D.   On: 05/01/2019 12:04   Mr Brain Wo Contrast  Result Date: 05/01/2019 CLINICAL DATA:  Stroke. EXAM: MRI HEAD WITHOUT CONTRAST TECHNIQUE: Multiplanar, multiecho pulse sequences of the brain and surrounding structures were obtained without intravenous contrast. COMPARISON:  CTA head 05/01/2019 FINDINGS: Brain: Negative for acute infarct. Small hyperintensities in the cerebral white matter bilaterally and in the central pons most consistent with mild chronic microvascular ischemia. Negative for hemorrhage or mass. Motion degraded image quality Vascular: Normal arterial flow voids Skull and upper cervical spine: Negative Sinuses/Orbits: Mild mucosal edema paranasal sinuses. Right mastoid effusion. Bilateral cataract surgery Other: None IMPRESSION: No acute abnormality.  Mild chronic microvascular ischemic changes. Electronically Signed   By: Franchot Gallo M.D.   On: 05/01/2019 12:46   Ct Cerebral Perfusion W Contrast  Result Date: 05/01/2019 CLINICAL DATA:  Pure aphasia.  Dementia EXAM: CT ANGIOGRAPHY HEAD AND NECK CT PERFUSION BRAIN TECHNIQUE: Multidetector CT imaging of the head and neck was performed using the standard protocol during bolus administration of intravenous contrast. Multiplanar CT image reconstructions and MIPs were obtained to evaluate the vascular anatomy. Carotid stenosis measurements (when applicable) are obtained utilizing NASCET criteria, using the distal internal carotid diameter as the denominator. Multiphase CT imaging of the brain was performed following IV bolus contrast injection. Subsequent parametric perfusion maps were calculated using RAPID software. CONTRAST:  Doses not yet known, studies in progress. COMPARISON:  Noncontrast head CT from earlier today FINDINGS: CTA NECK FINDINGS Aortic arch: Extensive atherosclerotic plaque. Bulky calcified plaque at the proximal left subclavian with high-grade stenosis if not  occlusion. There is symmetric enhancement in the subclavians. Right carotid system: Common carotid and proximal ICA atherosclerotic calcification. No flow limiting stenosis. ICA tortuosity with nonstenotic kink. Left carotid system: Scattered atherosclerotic calcification of the common and internal carotids without flow limiting stenosis or ulceration. Vertebral arteries: Severely stenotic or occluded proximal left subclavian with robust reconstitution. 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 Upper chest: Mild dependent atelectasis in the upper lobes. Partially covered ground-glass density in the left upper lobe. Review of the MIP images confirms the above findings CTA HEAD FINDINGS Anterior circulation: Atherosclerotic plaque on the carotid siphons. No branch occlusion or proximal flow limiting stenosis. Negative for aneurysm. Atherosclerotic irregularity of bilateral medium size branches. Posterior circulation: Atherosclerotic plaque on the left V4 segment without flow limiting stenosis. The vertebrobasilar arteries are smooth and patent. No PCA branch occlusion or beading. There is a mild to moderate atheromatous type narrowing at the left P3 segment. Negative for aneurysm Venous sinuses: Negative Anatomic variants: None significant Review of the MIP images confirms the above findings CT Brain Perfusion Findings: ASPECTS: 10 CBF (<30%) Volume: 37mL Perfusion (Tmax>6.0s) volume: 45mL IMPRESSION: 1. No emergent large vessel occlusion or infarct/penumbra by CT perfusion. 2. Occlusion or pre occlusion at the proximal left subclavian due to bulky calcified plaque. There is symmetric enhancement of the vertebral and subclavian arteries. 3. Moderate intracranial atherosclerosis without proximal flow limiting stenosis. 4. Possible left  upper lobe airspace disease, suggest chest x-ray correlation. Electronically Signed   By: Monte Fantasia M.D.   On: 05/01/2019 12:04   Ct Head Code Stroke Wo Contrast  Result Date: 05/01/2019 CLINICAL DATA:  Code stroke.  Expressive aphasia EXAM: CT HEAD WITHOUT CONTRAST TECHNIQUE: Contiguous axial images were  obtained from the base of the skull through the vertex without intravenous contrast. COMPARISON:  07/04/2018 FINDINGS: Brain: No evidence of acute infarction, hemorrhage, hydrocephalus, extra-axial collection or mass lesion/mass effect. Cerebral volume loss greater than expected for age Vascular: No definite hyperdensity. There is atherosclerotic calcification that is multifocal Skull: Hyperostosis interna. Sinuses/Orbits: Chronic right ear with opacified and sclerotic mastoid. Other: These results were communicated to Dr. Rory Percy at 11:48 amon 11/6/2020by text page via the [facility] messaging system. ASPECTS [facility] Stroke Program Early CT Score) - Ganglionic level infarction (caudate, lentiform nuclei, internal capsule, insula, M1-M3 cortex): 7 - Supraganglionic infarction (M4-M6 cortex): 3 Total score (0-10 with 10 being normal): 10 IMPRESSION: 1. No acute finding. 2. ASPECTS is 10. 3. Age advanced cerebral volume loss and atherosclerotic calcification. Electronically Signed   By: Monte Fantasia M.D.   On: 05/01/2019 11:51    EKG: Independently reviewed.  Sinus tachycardia with rate 100; nonspecific ST changes with no evidence of acute ischemia   Labs on Admission: I have personally reviewed the available labs and imaging studies at the time of the admission.  Pertinent labs:   Na++ 133 Glucose 109 WBC 8.5 Hgb 15.9 INR 1.0 COVID pending today; negative on 10/30, 10/27, 9/19, 9/14, 6/23, 6/18, 6/10, and 5/16   Assessment/Plan Principal Problem:   Hypertensive crisis Active Problems:   DM (diabetes mellitus), type 2 (HCC)   COPD (chronic obstructive pulmonary disease) (HCC)    Schizoaffective disorder, bipolar type (HCC)   Hyperlipidemia   Chronic diastolic CHF (congestive heart failure) ([place])    Hypertensive crisis with Abnormal EKG -Patient presenting with AMS and speech dysfunction that may be different from baseline -Initially called code stroke but negative MRI and clinical picture is less consistent with this  -She was recently admitted with hypertensive crisis and had marked BP elevation once again today -The patient received Labetalol IV x 1 in the ER with subsequent significant decrease in BP without apparent difficulty -Based on her BP control no longer in a dangerous range, will observe patient on telemetry rather than placing in ICU -Will continue home medications - Lisinopril, Lopressor, Procardia -Will add prn hydralazine -Possible d/c as early as tomorrow, but needs improved outpatient BP control  DM -A1c 5.2 in 11/2018, well controlled with diet alone  Schizoaffective d/o -Continue home Xanax, Wellbutrin, Latuda. Depakene -She appears to have been taking both Latuda and Seroquel, but both are 2nd generation atypical antipsychotics and likely should not be used together so will hold Seroquel -Neuro has ordered EEG  COPD -Hold nebs, will give Combivent and Albuterol -Continue Breo Ellipta -Appears to be compensated at this time  HLD -She does not appear to be taking medications for this issue at this time  Chronic diastolic CHF -Appears to be compensated -12/04/18 echo with preserved EF and "impaired" relaxation -Holding Lasix, not giving IVF, appears euvolemic for now    Note: This patient has been tested and is pending for the novel coronavirus COVID-19.  DVT prophylaxis:  Lovenox  Code Status: Full  Family Communication: None present; I spoke with her legal guardian, Silvano Rusk Disposition Plan:  Back to her SNF once clinically improved Consults called: SW Admission status: It is my clinical opinion that referral for  OBSERVATION is reasonable and necessary in this patient based on the above information provided. The aforementioned taken together are felt to place the patient at high risk for further clinical deterioration. However it is anticipated that the patient may be medically stable for discharge  from the hospital within 24 to 48 hours.     Karmen Bongo MD [facility]   How to contact the [facility] Attending or Consulting provider [place] or covering provider during after hours [place], for this patient?  1. Check the care team in [facility] and look for a) attending/consulting [facility] provider listed and b) the [facility] team listed 2. Log into [email or web address] and use [place]'s universal password to access. If you do not have the password, please contact the hospital operator. 3. Locate the [facility] provider you are looking for under [facility] and page to a number that you can be directly reached. 4. If you still have difficulty reaching the provider, please page the [facility] (Director on Call) for the Hospitalists listed on amion for assistance.   05/01/2019, 3:34 PM

## 2019-05-01 NOTE — ED Notes (Signed)
Patient transported to MRI 

## 2019-05-01 NOTE — Consult Note (Signed)
Neurology Consultation  Reason for Consult: Code stroke for aphasia Referring Physician: Dr. Delbert Phenix provider  CC: Difficulty talking  History is obtained from: Chart review, phone call with the facility  HPI: Natalie Thomas is a 58 y.o. female past medical history of anxiety, depression, schizoaffective disorder, CHF, diabetes, hypertension, hyperlipidemia, chronic hyponatremia, tobacco abuse, who is a resident of [facility] memory care facility, brought in for concern for speech difficulty. She was last seen normal by staff at 8:30 AM today. Upon return evaluation, she was noted to be not talking in her no producing any words.  She just was found to be mumbling.  EMS was called.  Initial blood pressure systolic in the [id number].  Other than the inability to talk, she was able to follow some commands.  She had no focal weakness or cranial nerve symptoms according to EMS on initial evaluation. She was still brought in as a code stroke because of the speech deficit. She has a recent admission to the hospital where she was discharged on 04/24/2019 for hypertensive urgency, acute on chronic hyponatremia. Patient unable to provide any reliable history  Chart review and HPI from last admission reveals that she was on chronic Valium but was switched to Xanax and that had triggered some behavioral changes in her.  She also had nausea vomiting, which initiated the last ER visit and admission in October 2020.  LKW: 8:30 AM on 05/01/2019 tpa given?: no, nonfocal exam, low NIH Premorbid modified Rankin scale (mRS): 4 wheelchair-bound at baseline  ROS:  Unable to obtain due to altered mental status.   Past Medical History:  Diagnosis Date  . Acute on chronic respiratory failure with hypoxia and hypercapnia ([place]) 10/07/2017  . Anemia 10/16/2017  . Anxiety and depression   . CHF (congestive heart failure) ([place])   . Chronic hyponatremia 04/08/2007   Qualifier: Diagnosis of  By: Marca Ancona RMA,  Lucy    . Chronic respiratory failure with hypoxia ([place]) 10/07/2017  . Closed comminuted intertrochanteric fracture of right femur ([place]) 11/07/2017  . COPD 04/08/2007   Qualifier: Diagnosis of  By: Marca Ancona RMA, Lucy    . Cough 05/08/2011  . Depression   . Diabetes mellitus, type 2 ([place])    pt denies but states that she has been treated for DM  . Dyspnea 10/16/2017  . Dysuria 04/08/2007   Qualifier: Diagnosis of  By: Reatha Armour, Lucy    . Essential hypertension 04/08/2007   Qualifier: Diagnosis of  By: Marca Ancona RMA, Lucy    . GERD (gastroesophageal reflux disease)   . HIP PAIN, LEFT 12/12/2007   Qualifier: Diagnosis of  By: Loanne Drilling MD, Jacelyn Pi   . Hyperlipidemia 10/07/2017  . Hypertension   . Hyponatremia 07/27/2011  . Incontinence of urine 08/14/2011  . Lumbar disc disease 04/09/2011  . MENOPAUSE, PREMATURE 04/08/2007   Qualifier: Diagnosis of  By: Marca Ancona RMA, Lucy    . 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 Osteoporosis   . Osteoporosis 04/08/2007   Qualifier: Diagnosis of  By: Reatha Armour, Lucy    . Polycythemia secondary to smoking 04/09/2011  . S/P right hip fracture 11/07/2017  . Schizoaffective disorder, bipolar type ([place]) 07/05/2011   No family history on file. Patient unable to provide  Social History:   reports that she has been smoking cigarettes. She has been smoking about 0.25 packs per day. She has never used smokeless tobacco. She reports that she  does not drink alcohol or use drugs.  Medications  Current Facility-Administered Medications:  .  sodium chloride flush (NS) 0.9 % injection 3 mL, 3 mL, Intravenous, Once, Gareth Morgan, MD  Current Outpatient Medications:  .  acetaminophen (TYLENOL) 325 MG tablet, Take 650 mg by mouth 2 (two) times daily as needed (pain). Do not exceed 3 grams/24 hours of tylenol from all sources, Disp: , Rfl:  .  acetaminophen (TYLENOL) 500 MG tablet, Take 500 mg by mouth every 6 (six) hours as  needed for fever., Disp: , Rfl:  .  albuterol (PROVENTIL) (2.5 MG/3ML) 0.083% nebulizer solution, Take 3 mLs (2.5 mg total) by nebulization every 4 (four) hours as needed for wheezing or shortness of breath., Disp: 75 mL, Rfl: 12 .  ALPRAZolam (XANAX) 0.5 MG tablet, Take 1 tablet (0.5 mg total) by mouth 2 (two) times daily., Disp: 60 tablet, Rfl: 0 .  aluminum-magnesium hydroxide-simethicone (MAALOX) [id number] MG/5ML SUSP, Take 30 mLs by mouth 4 (four) times daily as needed (heartburn, indigestion)., Disp: , Rfl:  .  Amino Acids-Protein Hydrolys (FEEDING SUPPLEMENT, PRO-STAT SUGAR FREE 64,) LIQD, Take 30 mLs by mouth 2 (two) times daily., Disp: 887 mL, Rfl: 0 .  aspirin 81 MG tablet, Take 1 tablet (81 mg total) by mouth daily. For heart health, Disp: 30 tablet, Rfl: 0 .  benzonatate (TESSALON) 100 MG capsule, Take 1 capsule (100 mg total) by mouth 3 (three) times daily as needed for cough., Disp: 20 capsule, Rfl: 0 .  buPROPion (WELLBUTRIN XL) 150 MG 24 hr tablet, Take 150 mg by mouth daily., Disp: , Rfl:  .  calcium carbonate (TUMS - DOSED IN MG ELEMENTAL CALCIUM) 500 MG chewable tablet, Chew 1 tablet (200 mg of elemental calcium total) by mouth 2 (two) times daily as needed for indigestion or heartburn., Disp: 30 tablet, Rfl: 0 .  docusate sodium (COLACE) 100 MG capsule, Take 100 mg by mouth 2 (two) times daily as needed (constipation). , Disp: , Rfl:  .  docusate sodium (COLACE) 100 MG capsule, Take 100 mg by mouth 2 (two) times daily. (scheduled), Disp: , Rfl:  .  feeding supplement, ENSURE ENLIVE, (ENSURE ENLIVE) LIQD, Take 237 mLs by mouth daily., Disp: 237 mL, Rfl: 12 .  fluticasone (FLONASE) 50 MCG/ACT nasal spray, Place 2 sprays into both nostrils 2 (two) times daily., Disp: , Rfl:  .  fluticasone furoate-vilanterol (BREO ELLIPTA) 100-25 MCG/INH AEPB, Inhale 1 puff into the lungs daily., Disp: , Rfl:  .  furosemide (LASIX) 20 MG tablet, Take 1 tablet (20 mg total) by mouth daily., Disp: 30  tablet, Rfl: 0 .  gabapentin (NEURONTIN) 100 MG capsule, Take 1 capsule (100 mg total) by mouth at bedtime. (Patient taking differently: Take 100 mg by mouth daily. ), Disp: 30 capsule, Rfl: 0 .  guaiFENesin (MUCINEX) 600 MG 12 hr tablet, Take 1,200 mg by mouth daily., Disp: , Rfl:  .  guaifenesin (ROBAFEN) 100 MG/5ML syrup, Take 200 mg by mouth 3 (three) times daily as needed for cough or congestion., Disp: , Rfl:  .  hydrOXYzine (ATARAX/VISTARIL) 25 MG tablet, Take 1 tablet (25 mg total) by mouth 3 (three) times daily as needed for anxiety. (Patient taking differently: Take 25 mg by mouth 3 (three) times daily. ), Disp: 15 tablet, Rfl: 0 .  ipratropium-albuterol (DUONEB) 0.5-2.5 (3) MG/3ML SOLN, Take 3 mLs by nebulization See admin instructions. Inhale one vial via hand held nebulizer three times daily, may also use every 8 hours as needed  for shortness of breath, Disp: , Rfl:  .  lisinopril (ZESTRIL) 40 MG tablet, Take 1 tablet (40 mg total) by mouth daily., Disp: 30 tablet, Rfl: 0 .  loratadine (CLARITIN) 10 MG tablet, Take 10 mg by mouth daily., Disp: , Rfl:  .  lurasidone (LATUDA) 40 MG TABS tablet, Take 1 tablet (40 mg total) by mouth 2 (two) times a day., Disp: 60 tablet, Rfl: 0 .  Melatonin 5 MG TABS, Take 5 mg by mouth at bedtime., Disp: , Rfl:  .  methocarbamol (ROBAXIN) 500 MG tablet, Take 500 mg by mouth every 8 (eight) hours as needed for muscle spasms., Disp: , Rfl:  .  methocarbamol (ROBAXIN) 500 MG tablet, Take 500 mg by mouth daily. (scheduled), Disp: , Rfl:  .  metoprolol tartrate (LOPRESSOR) 25 MG tablet, Take 1 tablet (25 mg total) by mouth 2 (two) times daily., Disp: 60 tablet, Rfl: 0 .  Multiple Vitamin (MULTIVITAMIN WITH MINERALS) TABS tablet, Take 1 tablet by mouth daily., Disp:  , Rfl:  .  nicotine (NICODERM CQ - DOSED IN MG/24 HOURS) 21 mg/24hr patch, Place 1 patch (21 mg total) onto the skin daily. (Patient not taking: Reported on 03/09/2019), Disp: 28 patch, Rfl: 0 .   NIFEdipine (PROCARDIA XL/ADALAT-CC) 90 MG 24 hr tablet, Take 90 mg by mouth daily., Disp: , Rfl:  .  nystatin (NYSTATIN) powder, Apply topically 2 (two) times daily as needed (to reddened area twice daily as needed for irritation)., Disp: , Rfl:  .  omeprazole (PRILOSEC) 40 MG capsule, Take 1 capsule (40 mg total) by mouth 2 (two) times daily. (Patient taking differently: Take 40 mg by mouth at bedtime. ), Disp: 60 capsule, Rfl:  .  oxyCODONE (OXY IR/ROXICODONE) 5 MG immediate release tablet, Take 1 tablet (5 mg total) by mouth every 12 (twelve) hours as needed for moderate pain or severe pain., Disp: 60 tablet, Rfl: 0 .  polyethylene glycol (MIRALAX) 17 g packet, Take 17 g by mouth daily., Disp: 14 each, Rfl: 0 .  QUEtiapine (SEROQUEL) 50 MG tablet, Take 1 tablet (50 mg total) by mouth at bedtime. (Patient taking differently: Take 100 mg by mouth 2 (two) times daily. ), Disp: 30 tablet, Rfl: 0 .  sodium chloride 1 g tablet, Take 1 tablet (1 g total) by mouth 3 (three) times daily with meals., Disp: 30 tablet, Rfl: 0 .  valproic acid (DEPAKENE) 250 MG capsule, Take 2 capsules (500 mg total) by mouth 2 (two) times daily., Disp: 120 capsule, Rfl: 0 .  vitamin C (ASCORBIC ACID) 500 MG tablet, Take 500 mg by mouth 2 (two) times daily., Disp: , Rfl:  .  Zinc Gluconate 100 MG TABS, Take 100 mg by mouth daily., Disp: , Rfl:    Exam: Current vital signs: BP (!) 201/86 (BP Location: Right Arm)   Pulse 99   Resp 20   Wt 80 kg   SpO2 97%   BMI 31.24 kg/m  Vital signs in last 24 hours: Pulse Rate:  [99] 99 (11/06 1204) Resp:  [20] 20 (11/06 1204) BP: (201)/(86) 201/86 (11/06 1204) SpO2:  [97 %] 97 % (11/06 1221) Weight:  [80 kg] 80 kg (11/06 1100) General: She is awake, alert, appears a little worried. HEENT: Cephalic atraumatic CVS: Regular rate rhythm, hypertensive Respiratory: Breathing well saturating normally on room air Abdomen: Nondistended nontender Extremities: Trace pedal edema on the  left leg. Neurological exam She is awake, alert. Initially, she was completely nonverbal but would intermittently respond  to questions-she told her name to the ED provider, upon noxious stimulation, yelled on my card. Follow some basic commands.  Able to mimic. Speech dysarthric-unclear if baseline or because of the poor dentition Cranial nerves: Pupils equal round reactive light, tracks examiner in both sides, blinks to threat from both sides, face appears symmetric, tongue and palate appear midline. Motor exam: Is able to mimic commands and raise all 4 extremities antigravity without any drift.  The left leg is slightly weaker than the right but there is no vertical drift. Sensory exam: Grimaces and withdraws to noxious stimulation symmetrically in all 4 extremities Coordination: No dysmetria assessed on reach for objects-unable to provide formal finger-nose-finger testing due to mentation Gait testing deferred at this time NIH stroke scale 1a Level of Conscious.: 0 1b LOC Questions: 2 1c LOC Commands: 0 2 Best Gaze: 0 3 Visual: 0 4 Facial Palsy: 0 5a Motor Arm - left: 0 5b Motor Arm - Right: 0 6a Motor Leg - Left: 0 6b Motor Leg - Right: 0 7 Limb Ataxia: 0 8 Sensory: 0 9 Best Language: 2 10 Dysarthria: 1 11 Extinct. and Inatten.: 0 TOTAL: 5  Labs I have reviewed labs in epic and the results pertinent to this consultation are:  CBC    Component Value Date/Time   WBC 8.5 04/22/2019 0149   RBC 5.68 (H) 04/22/2019 0149   HGB 17.0 (H) 05/01/2019 1133   HGB 14.4 06/22/2012 0014   HCT 50.0 (H) 05/01/2019 1133   HCT 43.6 06/22/2012 0014   PLT 219 04/22/2019 0149   PLT 189 06/22/2012 0014   MCV 83.8 04/22/2019 0149   MCV 89 06/22/2012 0014   MCH 28.3 04/22/2019 0149   MCHC 33.8 04/22/2019 0149   RDW 13.0 04/22/2019 0149   RDW 17.4 (H) 06/22/2012 0014   LYMPHSABS 1.4 03/11/2019 0550   LYMPHSABS 0.6 (L) 06/01/2012 0458   MONOABS 0.6 03/11/2019 0550   MONOABS 0.2 06/01/2012  0458   EOSABS 0.1 03/11/2019 0550   EOSABS 0.0 06/01/2012 0458   BASOSABS 0.0 03/11/2019 0550   BASOSABS 0.0 06/01/2012 0458    CMP     Component Value Date/Time   NA 135 05/01/2019 1133   NA 128 (L) 06/23/2012 0536   K 4.3 05/01/2019 1133   K 4.3 06/23/2012 0536   CL 102 05/01/2019 1133   CL 92 (L) 06/23/2012 0536   CO2 25 04/24/2019 0525   CO2 28 06/23/2012 0536   GLUCOSE 105 (H) 05/01/2019 1133   GLUCOSE 237 (H) 06/23/2012 0536   BUN 24 (H) 05/01/2019 1133   BUN 4 (L) 06/23/2012 0536   CREATININE 0.50 05/01/2019 1133   CREATININE 0.42 (L) 06/23/2012 0536   CALCIUM 8.9 04/24/2019 0525   CALCIUM 9.1 06/23/2012 0536   CALCIUM 9.3 04/09/2011 1058   PROT 7.7 04/22/2019 0149   PROT 6.9 06/22/2012 0014   ALBUMIN 3.7 04/22/2019 0149   ALBUMIN 2.8 (L) 06/22/2012 0014   AST 17 04/22/2019 0149   AST 10 (L) 06/22/2012 0014   ALT 15 04/22/2019 0149   ALT 11 (L) 06/22/2012 0014   ALKPHOS 102 04/22/2019 0149   ALKPHOS 81 06/22/2012 0014   BILITOT 0.3 04/22/2019 0149   BILITOT 0.2 06/22/2012 0014   GFRNONAA >60 04/24/2019 0525   GFRNONAA >60 06/23/2012 0536   GFRAA >60 04/24/2019 0525   GFRAA >60 06/23/2012 0536     Imaging I have reviewed the images obtained:  CT-scan of the brain-no acute changes.  Aspects  10 CTA head and neck-no emergent LVO.  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  Symmetric enhancement of the vertebral and subclavian arteries bilaterally.  Moderate iCAD without proximal flow-limiting stenosis.  Possible left upper airway lobe airspace disease.  MRI examination of the brain-stat MRI negative for acute stroke  Assessment: 58 year old with above past medical history brought in for sudden onset of difficulty talking. She was last seen normal 8:30 AM and brought as an acute code stroke for pure expressive aphasia. Her aphasia was intermittent and she was able to at times speak some words and the other times  not. On examination appeared slightly inconsistent.  Because of that reason, I pursued a stat MRI, which did not reveal any acute stroke. This likely represents symptoms related to hypertensive urgency versus behavioral issues. Other considerations could include seizures although suspicion is low but her benzodiazepines were recently changed and could have adverse electrographic impacts.   Impression: Evaluate for hypertensive urgency Evaluate for stroke-negative with a negative MRI Evaluate for seizures Evaluate for behavioral issues related to her existing psychiatric diagnosis  Recommendations: Blood pressure control-goal blood pressure will be less than 180.  Gradually reduce blood pressure over the next few days to normotension. Obtain a routine EEG.  If negative, no antiepileptics needed. No stroke work-up indicated-MRI negative for stroke. Supportive care and medical management per the primary teams for any other abnormalities that might be seen.  Evaluate for any underlying evidence of infection such as pneumonia or UTI.  -- Amie Portland, MD [facility] Pager: [phone] If 7pm to 7am, please call on call as listed on AMION.  CRITICAL CARE ATTESTATION Performed by: Amie Portland, MD Total critical care time: 60 minutes Critical care time was exclusive of separately billable procedures and treating other patients and/or supervising APPs/Residents/Students Critical care was necessary to treat or prevent imminent or life-threatening deterioration due to strokelike symptoms, hypertensive urgency This patient is critically ill and at significant risk for neurological worsening and/or death and care requires constant monitoring. Critical care was time spent personally by me on the following activities: development of treatment plan with patient and/or surrogate as well as nursing, discussions with consultants, evaluation of patient's response to treatment, examination of  patient, obtaining history from patient or surrogate, ordering and performing treatments and interventions, ordering and review of laboratory studies, ordering and review of radiographic studies, pulse oximetry, re-evaluation of patient's condition, participation in multidisciplinary rounds and medical decision making of high complexity in the care of this patient.

## 2019-05-01 NOTE — ED Provider Notes (Signed)
[facility] EMERGENCY DEPARTMENT Provider Note   CSN: [id number] Arrival date & time: 05/01/19  1126  An emergency department physician performed an initial assessment on this suspected stroke patient at 1129.  History   Chief Complaint Chief Complaint  Patient presents with   Code Stroke    HPI Natalie Thomas is a 58 y.o. female.     HPI   58 year old female with a history of CHF, chronic respiratory failure with hypoxia, COPD, diabetes, hypertension, hyperlipidemia, schizoaffective disorder, recent admission for malignant hypertensive urgency, acute on chronic hyponatremia, who presents as a code stroke from [facility] with concern for difficulty speaking beginning at 8:30 AM.   History is limited by patient's difficulty speaking.  Difficulty speaking per report began at 830 AM, no other focal deficits noted.   Past Medical History:  Diagnosis Date   Anemia 10/16/2017   Anxiety and depression    CHF (congestive heart failure) (HCC)    Chronic hyponatremia 04/08/2007   Qualifier: Diagnosis of  By: Marca Ancona RMA, Lucy     Closed comminuted intertrochanteric fracture of right femur ([place]) 11/07/2017   COPD 04/08/2007   Qualifier: Diagnosis of  By: Marca Ancona RMA, Lucy     Depression    Diabetes mellitus, type 2 ([place])    pt denies but states that she has been treated for DM   Essential hypertension 04/08/2007   Qualifier: Diagnosis of  By: Marca Ancona RMA, Lucy     GERD (gastroesophageal reflux disease)    Hyperlipidemia 10/07/2017   Lumbar disc disease 04/09/2011   MENOPAUSE, PREMATURE 04/08/2007   Qualifier: Diagnosis of  By: Marca Ancona RMA, Lucy     NEOPLASM, MALIGNANT, VULVA 04/08/2007   Qualifier: Diagnosis of  By: Marca Ancona RMA, Lucy     Osteoporosis 04/08/2007   Qualifier: Diagnosis of  By: Marca Ancona RMA, Lucy     Polycythemia secondary to smoking 04/09/2011   Schizoaffective disorder, bipolar type ([place]) 07/05/2011    Patient Active Problem List     Diagnosis Date Noted   Hypertensive crisis 05/01/2019   Abdominal pain 03/15/2019   Pleural effusion 03/13/2019   Atelectasis 03/13/2019   Chronic diastolic CHF (congestive heart failure) ([place]) 03/13/2019   Type 2 diabetes mellitus without complication ([place]) [id number]   Urinary tract bacterial infections 03/09/2019   Acute cystitis without hematuria    Constipation    Acute hypoxemic respiratory failure ([place]) 12/03/2018   Irritability 11/09/2018   Aggressive behavior 11/09/2018   Agitation    S/P right hip fracture 11/07/2017   Closed comminuted intertrochanteric fracture of right femur ([place]) 11/07/2017   Anxiety and depression    Dyspnea 10/16/2017   Anemia 10/16/2017   COPD exacerbation ([place]) 10/07/2017   Hyperlipidemia 10/07/2017   Acute on chronic respiratory failure with hypoxia and hypercapnia ([place]) 10/07/2017   Incontinence of urine 08/14/2011   Schizoaffective disorder, bipolar type ([place]) 07/05/2011   Cough 05/08/2011   Encounter for long-term (current) use of other medications 04/09/2011   Lumbar disc disease 04/09/2011   Polycythemia secondary to smoking 04/09/2011   HIP PAIN, LEFT 12/12/2007   NEOPLASM, MALIGNANT, VULVA 04/08/2007   DM (diabetes mellitus), type 2 ([place]) 04/08/2007   MENOPAUSE, PREMATURE 04/08/2007   Chronic hyponatremia 04/08/2007   Schizoaffective disorder ([place]) 04/08/2007   DEPRESSION 04/08/2007   Essential hypertension 04/08/2007   COPD (chronic obstructive pulmonary disease) ([place]) 04/08/2007   GERD 04/08/2007   Osteoporosis 04/08/2007   DYSURIA 04/08/2007    Past Surgical History:  Procedure Laterality Date  EYE SURGERY     on left  eye, pt states that she sees bad out of the right eye and needs surgery there   INTRAMEDULLARY (IM) NAIL INTERTROCHANTERIC Right 11/13/2017   Procedure: INTRAMEDULLARY (IM) NAIL INTERTROCHANTRIC;  Surgeon: Shona Needles, MD;  Location: [facility];  Service: Orthopedics;   Laterality: Right;   PELVIC FRACTURE SURGERY       OB History   No obstetric history on file.      Home Medications    Prior to Admission medications   Medication Sig Start Date End Date Taking? Authorizing Provider  acetaminophen (TYLENOL) 325 MG tablet Take 650 mg by mouth 2 (two) times daily as needed (pain). Do not exceed 3 grams/24 hours of tylenol from all sources   Yes [provider]  acetaminophen (TYLENOL) 500 MG tablet Take 500 mg by mouth every 6 (six) hours as needed.   Yes [provider]  albuterol (PROVENTIL) (2.5 MG/3ML) 0.083% nebulizer solution Take 3 mLs (2.5 mg total) by nebulization every 4 (four) hours as needed for wheezing or shortness of breath. 12/11/18  Yes Swayze, Ava, DO  ALPRAZolam (XANAX) 0.5 MG tablet Take 1 tablet (0.5 mg total) by mouth 2 (two) times daily. 04/24/19  Yes [place] Eric J, DO  aluminum-magnesium hydroxide-simethicone (MAALOX) 200-200-20 MG/5ML SUSP Take 30 mLs by mouth every 6 (six) hours as needed (heartburn, indigestion).    Yes [provider]  Amino Acids-Protein Hydrolys (FEEDING SUPPLEMENT, PRO-STAT SUGAR FREE 64,) LIQD Take 30 mLs by mouth 2 (two) times daily. 03/14/19  Yes Oretha Milch D, MD  aspirin 81 MG tablet Take 1 tablet (81 mg total) by mouth daily. For heart health 08/06/11  Yes Greig Castilla, FNP  benzonatate (TESSALON) 100 MG capsule Take 1 capsule (100 mg total) by mouth 3 (three) times daily as needed for cough. 12/11/18  Yes Swayze, Ava, DO  buPROPion (WELLBUTRIN XL) 150 MG 24 hr tablet Take 150 mg by mouth daily.   Yes [provider]  Calcium Carb-Cholecalciferol (CALCIUM 600+D) 600-800 MG-UNIT TABS Take 1 tablet by mouth daily.   Yes [provider]  calcium carbonate (TUMS - DOSED IN MG ELEMENTAL CALCIUM) 500 MG chewable tablet Chew 1 tablet (200 mg of elemental calcium total) by mouth 2 (two) times daily as needed for indigestion or heartburn. 12/11/18  Yes Swayze, Ava,  DO  docusate sodium (COLACE) 100 MG capsule Take 100 mg by mouth See admin instructions. Take one capsule (100 mg) by mouth every 12 hours, may also take one capsule (100 mg) twice daily as needed for constipation   Yes [provider]  feeding supplement, ENSURE ENLIVE, (ENSURE ENLIVE) LIQD Take 237 mLs by mouth daily. 03/14/19  Yes Desiree Hane, MD  fluticasone (FLONASE) 50 MCG/ACT nasal spray Place 2 sprays into both nostrils 2 (two) times daily.   Yes [provider]  fluticasone furoate-vilanterol (BREO ELLIPTA) 100-25 MCG/INH AEPB Inhale 1 puff into the lungs daily.   Yes [provider]  furosemide (LASIX) 20 MG tablet Take 1 tablet (20 mg total) by mouth daily. 04/25/19 05/25/19 Yes [place] Eric J, DO  gabapentin (NEURONTIN) 100 MG capsule Take 1 capsule (100 mg total) by mouth at bedtime. Patient taking differently: Take 100 mg by mouth daily.  12/11/18  Yes Swayze, Ava, DO  guaiFENesin (MUCINEX) 600 MG 12 hr tablet Take 1,200 mg by mouth daily.   Yes [provider]  guaifenesin (ROBAFEN) 100 MG/5ML syrup Take 200 mg  by mouth every 8 (eight) hours as needed for cough or congestion.    Yes [provider]  hydrOXYzine (VISTARIL) 50 MG capsule Take 50 mg by mouth 3 (three) times daily.   Yes [provider]  ipratropium-albuterol (DUONEB) 0.5-2.5 (3) MG/3ML SOLN Take 3 mLs by nebulization 3 (three) times daily.    Yes [provider]  lisinopril (ZESTRIL) 40 MG tablet Take 1 tablet (40 mg total) by mouth daily. 04/25/19 05/25/19 Yes [place] Eric J, DO  loratadine (CLARITIN) 10 MG tablet Take 10 mg by mouth daily.   Yes [provider]  lurasidone (LATUDA) 40 MG TABS tablet Take 1 tablet (40 mg total) by mouth 2 (two) times a day. Patient taking differently: Take 40 mg by mouth daily with breakfast.  12/18/18  Yes Kayleen Memos, DO  Melatonin 5 MG TABS Take 5 mg by mouth at bedtime.   Yes [provider]    methocarbamol (ROBAXIN) 500 MG tablet Take 500 mg by mouth See admin instructions. Take one tablet (500 mg) by mouth daily, may also take one tablet (500 mg) every 8 hours as needed for muscle spasms   Yes [provider]  metoprolol tartrate (LOPRESSOR) 25 MG tablet Take 1 tablet (25 mg total) by mouth 2 (two) times daily. 12/17/18  Yes Kayleen Memos, DO  Multiple Vitamin (MULTIVITAMIN WITH MINERALS) TABS tablet Take 1 tablet by mouth daily. 03/15/19  Yes Oretha Milch D, MD  neomycin-bacitracin-polymyxin (NEOSPORIN) OINT Apply 1 application topically as needed for wound care (minor skin tears or abrasians).   Yes [provider]  NIFEdipine (PROCARDIA XL/ADALAT-CC) 90 MG 24 hr tablet Take 90 mg by mouth daily.   Yes [provider]  nystatin (NYSTATIN) powder Apply topically See admin instructions. Apply topically to reddened area twice daily as needed for irritation   Yes [provider]  omeprazole (PRILOSEC) 40 MG capsule Take 1 capsule (40 mg total) by mouth 2 (two) times daily. Patient taking differently: Take 40 mg by mouth at bedtime.  03/16/19  Yes Oretha Milch D, MD  oxyCODONE (OXY IR/ROXICODONE) 5 MG immediate release tablet Take 1 tablet (5 mg total) by mouth every 12 (twelve) hours as needed for moderate pain or severe pain. 04/24/19  Yes [place] Eric J, DO  polyethylene glycol (MIRALAX) 17 g packet Take 17 g by mouth daily. 12/11/18  Yes Swayze, Ava, DO  QUEtiapine (SEROQUEL) 50 MG tablet Take 1 tablet (50 mg total) by mouth at bedtime. Patient taking differently: Take 50 mg by mouth 2 (two) times daily.  12/11/18  Yes Swayze, Ava, DO  sodium chloride 1 g tablet Take 1 tablet (1 g total) by mouth 3 (three) times daily with meals. 12/17/18  Yes Kayleen Memos, DO  valproic acid (DEPAKENE) 250 MG capsule Take 2 capsules (500 mg total) by mouth 2 (two) times daily. 12/11/18  Yes Swayze, Ava, DO  vitamin C (ASCORBIC ACID) 500 MG tablet Take 500 mg by mouth  2 (two) times daily.   Yes [provider]  Zinc Gluconate 100 MG TABS Take 100 mg by mouth daily.   Yes [provider]  hydrOXYzine (ATARAX/VISTARIL) 25 MG tablet Take 1 tablet (25 mg total) by mouth 3 (three) times daily as needed for anxiety. Patient not taking: Reported on 05/01/2019 10/19/17   Aline August, MD    Family History No family history on file.  Social History Social History   Tobacco Use   Smoking status:  Current Every Day Smoker    Packs/day: 0.25    Types: Cigarettes   Smokeless tobacco: Never Used  Substance Use Topics   Alcohol use: No   Drug use: No     Allergies   Clarithromycin and Penicillins   Review of Systems Review of Systems  Unable to perform ROS: Mental status change     Physical Exam Updated Vital Signs BP (!) 231/80 (BP Location: Left Arm)    Pulse 91    Temp 98.2 F (36.8 C) (Oral)    Resp 19    Ht 5\' 3"  (1.6 m)    Wt 80 kg    SpO2 98%    BMI 31.24 kg/m   Physical Exam Vitals signs and nursing note reviewed.  Constitutional:      General: She is not in acute distress.    Appearance: She is well-developed. She is not diaphoretic.  HENT:     Head: Normocephalic and atraumatic.  Eyes:     Conjunctiva/sclera: Conjunctivae normal.  Neck:     Musculoskeletal: Normal range of motion.  Cardiovascular:     Rate and Rhythm: Normal rate and regular rhythm.     Heart sounds: Normal heart sounds. No murmur. No friction rub. No gallop.   Pulmonary:     Effort: Pulmonary effort is normal. No respiratory distress.     Breath sounds: Normal breath sounds. No wheezing or rales.  Abdominal:     General: There is no distension.     Palpations: Abdomen is soft.     Tenderness: There is no abdominal tenderness. There is no guarding.  Musculoskeletal:        General: No tenderness.  Skin:    General: Skin is warm and dry.     Findings: No erythema or rash.  Neurological:     Mental Status: She is alert.     Comments:  States name, unable to state location Names colors of pen but not able to state the name Observed lifting extremities to command with nursing but on my exam does need more specific direction, no focal deficits, no weakness, no facial droop, normal EOM, normal pupils. Unable to state if sensation different.        ED Treatments / Results  Labs (all labs ordered are listed, but only abnormal results are displayed) Labs Reviewed  CBC - Abnormal; Notable for the following components:      Result Value   RBC 5.58 (*)    Hemoglobin 15.9 (*)    HCT 47.7 (*)    All other components within normal limits  COMPREHENSIVE METABOLIC PANEL - Abnormal; Notable for the following components:   Sodium 133 (*)    Chloride 97 (*)    CO2 21 (*)    Glucose, Bld 109 (*)    All other components within normal limits  I-STAT CHEM 8, ED - Abnormal; Notable for the following components:   BUN 24 (*)    Glucose, Bld 105 (*)    Calcium, Ion 1.11 (*)    Hemoglobin 17.0 (*)    HCT 50.0 (*)    All other components within normal limits  CBG MONITORING, ED - Abnormal; Notable for the following components:   Glucose-Capillary 103 (*)    All other components within normal limits  SARS CORONAVIRUS 2 (TAT 6-24 HRS)  DIFFERENTIAL  PROTIME-INR  APTT  BASIC METABOLIC PANEL  CBC  I-STAT BETA HCG BLOOD, ED ([facility], [facility], [facility] ONLY)    EKG None  Radiology Ct Angio Head W Or Wo Contrast  Result Date: 05/01/2019 CLINICAL DATA:  Pure aphasia.  Dementia EXAM: CT ANGIOGRAPHY HEAD AND NECK CT PERFUSION BRAIN TECHNIQUE: Multidetector CT imaging of the head and neck was performed using the standard protocol during bolus administration of intravenous contrast. Multiplanar CT image reconstructions and MIPs were obtained to evaluate the vascular anatomy. Carotid stenosis measurements (when applicable) are obtained utilizing NASCET criteria, using the distal internal carotid diameter as the denominator. Multiphase CT imaging of the  brain was performed following IV bolus contrast injection. Subsequent parametric perfusion maps were calculated using RAPID software. CONTRAST:  Doses not yet known, studies in progress. COMPARISON:  Noncontrast head CT from earlier today FINDINGS: CTA NECK FINDINGS Aortic arch: Extensive atherosclerotic plaque. Bulky calcified plaque at the proximal left subclavian with high-grade stenosis if not occlusion. There is symmetric enhancement in the subclavians. Right carotid system: Common carotid and proximal ICA atherosclerotic calcification. No flow limiting stenosis. ICA tortuosity with nonstenotic kink. Left carotid system: Scattered atherosclerotic calcification of the common and internal carotids without flow limiting stenosis or ulceration. Vertebral arteries: Severely stenotic or occluded proximal left subclavian with robust reconstitution. 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 Upper chest: Mild dependent atelectasis in the upper lobes. Partially covered ground-glass density in the left upper lobe. Review of the MIP images confirms the above findings CTA HEAD FINDINGS Anterior circulation: Atherosclerotic plaque on the carotid siphons. No branch occlusion or proximal flow limiting stenosis. Negative for aneurysm. Atherosclerotic irregularity of bilateral medium size branches. Posterior circulation: Atherosclerotic plaque on the left V4 segment without flow limiting stenosis. The vertebrobasilar arteries are smooth and patent. No PCA branch occlusion or beading. There is a mild to moderate atheromatous type narrowing at the left P3 segment. Negative for aneurysm Venous sinuses: Negative Anatomic variants: None significant Review of the MIP images confirms the above findings CT Brain Perfusion Findings: ASPECTS: 10 CBF (<30%)  Volume: 47mL Perfusion (Tmax>6.0s) volume: 68mL IMPRESSION: 1. No emergent large vessel occlusion or infarct/penumbra by CT perfusion. 2. Occlusion or pre occlusion at the proximal left subclavian due to bulky calcified plaque. There is symmetric enhancement of the vertebral and subclavian arteries. 3. Moderate intracranial atherosclerosis without proximal flow limiting stenosis. 4. Possible left upper lobe airspace disease, suggest chest x-ray correlation. Electronically Signed   By: Monte Fantasia M.D.   On: 05/01/2019 12:04   Ct Angio Neck W Or Wo Contrast  Result Date: 05/01/2019 CLINICAL DATA:  Pure aphasia.  Dementia EXAM: CT ANGIOGRAPHY HEAD AND NECK CT PERFUSION BRAIN TECHNIQUE: Multidetector CT imaging of the head and neck was performed using the standard protocol during bolus administration of intravenous contrast. Multiplanar CT image reconstructions and MIPs were obtained to evaluate the vascular anatomy. Carotid stenosis measurements (when applicable) are obtained utilizing NASCET criteria, using the distal internal carotid diameter as the denominator. Multiphase CT imaging of the brain was performed following IV bolus contrast injection. Subsequent parametric perfusion maps were calculated using RAPID software. CONTRAST:  Doses not yet known, studies in progress. COMPARISON:  Noncontrast head CT from earlier today FINDINGS: CTA NECK FINDINGS Aortic arch: Extensive atherosclerotic plaque. Bulky calcified plaque at the proximal left subclavian with high-grade stenosis if not occlusion. There is symmetric enhancement in the subclavians. Right carotid system: Common carotid and proximal ICA atherosclerotic calcification. No flow limiting stenosis. ICA tortuosity with  nonstenotic kink. Left carotid system: Scattered atherosclerotic calcification of the common and internal carotids without flow limiting stenosis or ulceration. Vertebral arteries: Severely stenotic or occluded proximal left subclavian with  robust reconstitution. 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 Upper chest: Mild dependent atelectasis in the upper lobes. Partially covered ground-glass density in the left upper lobe. Review of the MIP images confirms the above findings CTA HEAD FINDINGS Anterior circulation: Atherosclerotic plaque on the carotid siphons. No branch occlusion or proximal flow limiting stenosis. Negative for aneurysm. Atherosclerotic irregularity of bilateral medium size branches. Posterior circulation: Atherosclerotic plaque on the left V4 segment without flow limiting stenosis. The vertebrobasilar arteries are smooth and patent. No PCA branch occlusion or beading. There is a mild to moderate atheromatous type narrowing at the left P3 segment. Negative for aneurysm Venous sinuses: Negative Anatomic variants: None significant Review of the MIP images confirms the above findings CT Brain Perfusion Findings: ASPECTS: 10 CBF (<30%) Volume: 59mL Perfusion (Tmax>6.0s) volume: 66mL IMPRESSION: 1. No emergent large vessel occlusion or infarct/penumbra by CT perfusion. 2. Occlusion or pre occlusion at the proximal left subclavian due to bulky calcified plaque. There is symmetric enhancement of the vertebral and subclavian arteries. 3. Moderate intracranial atherosclerosis without proximal flow limiting stenosis. 4. Possible left upper lobe airspace disease, suggest chest x-ray correlation. Electronically Signed   By: Monte Fantasia M.D.   On: 05/01/2019 12:04   Mr Brain Wo Contrast  Result Date: 05/01/2019 CLINICAL DATA:  Stroke. EXAM: MRI HEAD WITHOUT CONTRAST TECHNIQUE: Multiplanar, multiecho pulse sequences of the brain and surrounding structures were obtained without intravenous contrast. COMPARISON:  CTA head 05/01/2019 FINDINGS: Brain: Negative for  acute infarct. Small hyperintensities in the cerebral white matter bilaterally and in the central pons most consistent with mild chronic microvascular ischemia. Negative for hemorrhage or mass. Motion degraded image quality Vascular: Normal arterial flow voids Skull and upper cervical spine: Negative Sinuses/Orbits: Mild mucosal edema paranasal sinuses. Right mastoid effusion. Bilateral cataract surgery Other: None IMPRESSION: No acute abnormality.  Mild chronic microvascular ischemic changes. Electronically Signed   By: Franchot Gallo M.D.   On: 05/01/2019 12:46   Ct Cerebral Perfusion W Contrast  Result Date: 05/01/2019 CLINICAL DATA:  Pure aphasia.  Dementia EXAM: CT ANGIOGRAPHY HEAD AND NECK CT PERFUSION BRAIN TECHNIQUE: Multidetector CT imaging of the head and neck was performed using the standard protocol during bolus administration of intravenous contrast. Multiplanar CT image reconstructions and MIPs were obtained to evaluate the vascular anatomy. Carotid stenosis measurements (when applicable) are obtained utilizing NASCET criteria, using the distal internal carotid diameter as the denominator. Multiphase CT imaging of the brain was performed following IV bolus contrast injection. Subsequent parametric perfusion maps were calculated using RAPID software. CONTRAST:  Doses not yet known, studies in progress. COMPARISON:  Noncontrast head CT from earlier today FINDINGS: CTA NECK FINDINGS Aortic arch: Extensive atherosclerotic plaque. Bulky calcified plaque at the proximal left subclavian with high-grade stenosis if not occlusion. There is symmetric enhancement in the subclavians. Right carotid system: Common carotid and proximal ICA atherosclerotic calcification. No flow limiting stenosis. ICA tortuosity with nonstenotic kink. Left carotid system: Scattered atherosclerotic calcification of the common and internal carotids without flow limiting stenosis or ulceration. Vertebral arteries: Severely stenotic  or occluded proximal left subclavian with robust reconstitution. The left vertebral and subclavian arteries are symmetrically enhancing, no definite retrograde  flow Skeleton: No acute finding Other neck: Submucosal nodular density in the left paraglottic fat, likely a internal laryngocele. Upper chest: Mild dependent atelectasis in the upper lobes. Partially covered ground-glass density in the left upper lobe. Review of the MIP images confirms the above findings CTA HEAD FINDINGS Anterior circulation: Atherosclerotic plaque on the carotid siphons. No branch occlusion or proximal flow limiting stenosis. Negative for aneurysm. Atherosclerotic irregularity of bilateral medium size branches. Posterior circulation: Atherosclerotic plaque on the left V4 segment without flow limiting stenosis. The vertebrobasilar arteries are smooth and patent. No PCA branch occlusion or beading. There is a mild to moderate atheromatous type narrowing at the left P3 segment. Negative for aneurysm Venous sinuses: Negative Anatomic variants: None significant Review of the MIP images confirms the above findings CT Brain Perfusion Findings: ASPECTS: 10 CBF (<30%) Volume: 79mL Perfusion (Tmax>6.0s) volume: 53mL IMPRESSION: 1. No emergent large vessel occlusion or infarct/penumbra by CT perfusion. 2. Occlusion or pre occlusion at the proximal left subclavian due to bulky calcified plaque. There is symmetric enhancement of the vertebral and subclavian arteries. 3. Moderate intracranial atherosclerosis without proximal flow limiting stenosis. 4. Possible left upper lobe airspace disease, suggest chest x-ray correlation. Electronically Signed   By: Monte Fantasia M.D.   On: 05/01/2019 12:04   Ct Head Code Stroke Wo Contrast  Result Date: 05/01/2019 CLINICAL DATA:  Code stroke.  Expressive aphasia EXAM: CT HEAD WITHOUT CONTRAST TECHNIQUE: Contiguous axial images were obtained from the base of the skull through the vertex without intravenous  contrast. COMPARISON:  07/04/2018 FINDINGS: Brain: No evidence of acute infarction, hemorrhage, hydrocephalus, extra-axial collection or mass lesion/mass effect. Cerebral volume loss greater than expected for age Vascular: No definite hyperdensity. There is atherosclerotic calcification that is multifocal Skull: Hyperostosis interna. Sinuses/Orbits: Chronic right ear with opacified and sclerotic mastoid. Other: These results were communicated to Dr. Rory Percy at 11:48 amon 11/6/2020by text page via the [facility] messaging system. ASPECTS [facility] Stroke Program Early CT Score) - Ganglionic level infarction (caudate, lentiform nuclei, internal capsule, insula, M1-M3 cortex): 7 - Supraganglionic infarction (M4-M6 cortex): 3 Total score (0-10 with 10 being normal): 10 IMPRESSION: 1. No acute finding. 2. ASPECTS is 10. 3. Age advanced cerebral volume loss and atherosclerotic calcification. Electronically Signed   By: Monte Fantasia M.D.   On: 05/01/2019 11:51    Procedures .Critical Care Performed by: Gareth Morgan, MD Authorized by: Gareth Morgan, MD   Critical care provider statement:    Critical care time (minutes):  30   Critical care was necessary to treat or prevent imminent or life-threatening deterioration of the following conditions:  CNS failure or compromise   Critical care was time spent personally by me on the following activities:  Discussions with consultants, blood draw for specimens, evaluation of patient's response to treatment, review of old charts, ordering and review of radiographic studies and ordering and review of laboratory studies   (including critical care time)  Medications Ordered in ED Medications  aspirin EC tablet 81 mg (has no administration in time range)  oxyCODONE (Oxy IR/ROXICODONE) immediate release tablet 5 mg (has no administration in time range)  lisinopril (ZESTRIL) tablet 40 mg (40 mg Oral Not Given 05/01/19 1616)  metoprolol tartrate (LOPRESSOR) tablet 25 mg  (has no administration in time range)  NIFEdipine (PROCARDIA XL/NIFEDICAL-XL) 24 hr tablet 90 mg (has no administration in time range)  hydrALAZINE (APRESOLINE) injection 5 mg (5 mg Intravenous Given 05/01/19 1704)  ALPRAZolam (XANAX) tablet 0.5 mg (has no administration in time  range)  buPROPion (WELLBUTRIN XL) 24 hr tablet 150 mg (has no administration in time range)  hydrOXYzine (ATARAX/VISTARIL) tablet 25 mg (25 mg Oral Not Given 05/01/19 1617)  lurasidone (LATUDA) tablet 40 mg (has no administration in time range)  QUEtiapine (SEROQUEL) tablet 100 mg (has no administration in time range)  pantoprazole (PROTONIX) EC tablet 40 mg (has no administration in time range)  polyethylene glycol (MIRALAX / GLYCOLAX) packet 17 g (has no administration in time range)  Melatonin TABS 6 mg (has no administration in time range)  gabapentin (NEURONTIN) capsule 100 mg (has no administration in time range)  methocarbamol (ROBAXIN) tablet 500 mg (has no administration in time range)  valproic acid (DEPAKENE) 250 MG capsule 500 mg (has no administration in time range)  feeding supplement (PRO-STAT SUGAR FREE 64) liquid 30 mL (has no administration in time range)  feeding supplement (ENSURE ENLIVE) (ENSURE ENLIVE) liquid 237 mL (has no administration in time range)  sodium chloride tablet 1 g (has no administration in time range)  fluticasone (FLONASE) 50 MCG/ACT nasal spray 2 spray (has no administration in time range)  fluticasone furoate-vilanterol (BREO ELLIPTA) 100-25 MCG/INH 1 puff (1 puff Inhalation Not Given 05/01/19 1632)  guaiFENesin (MUCINEX) 12 hr tablet 1,200 mg (1,200 mg Oral Not Given 05/01/19 1618)  guaifenesin (ROBITUSSIN) 100 MG/5ML syrup 200 mg (has no administration in time range)  loratadine (CLARITIN) tablet 10 mg (has no administration in time range)  albuterol (PROVENTIL) (2.5 MG/3ML) 0.083% nebulizer solution 3 mL (has no administration in time range)  enoxaparin (LOVENOX) injection 40 mg  (40 mg Subcutaneous Given 05/01/19 2023)  acetaminophen (TYLENOL) tablet 650 mg (has no administration in time range)    Or  acetaminophen (TYLENOL) suppository 650 mg (has no administration in time range)  docusate sodium (COLACE) capsule 100 mg (100 mg Oral Not Given 05/01/19 1617)  ondansetron (ZOFRAN) tablet 4 mg (has no administration in time range)    Or  ondansetron (ZOFRAN) injection 4 mg (has no administration in time range)  ipratropium-albuterol (DUONEB) 0.5-2.5 (3) MG/3ML nebulizer solution 3 mL (has no administration in time range)  iohexol (OMNIPAQUE) 350 MG/ML injection 100 mL (100 mLs Intravenous Contrast Given 05/01/19 1144)  labetalol (NORMODYNE) 5 MG/ML injection (2.5 mg  Given 05/01/19 1210)  labetalol (NORMODYNE) injection 10 mg (10 mg Intravenous Given 05/01/19 1442)     Initial Impression / Assessment and Plan / ED Course  I have reviewed the triage vital signs and the nursing notes.  Pertinent labs & imaging results that were available during my care of the patient were reviewed by me and considered in my medical decision making (see chart for details).        58 year old female with a history of CHF, chronic respiratory failure with hypoxia, COPD, diabetes, hypertension, hyperlipidemia, schizoaffective disorder, recent admission for malignant hypertensive urgency, acute on chronic hyponatremia, who presents as a code stroke from [facility] with concern for difficulty speaking beginning at 8:30 AM.   Dr Rory Percy at bedside on patient's arrival and evaluated.  Currently, higher suspicion for hypertensive emergency than acute ischemic stroke given aphasia without other deficits, history unclear, and feel risk of tPA outweigh current benefits however will obtain emergent MRI imaging, given labetalol for hypertensive emergency.   MRI without signs of stroke. Will admit for htn emergency.  BP improved with labetalol to 170s. Will admit for continued care.    Final Clinical  Impressions(s) / ED Diagnoses   Final diagnoses:  Aphasia  Hypertensive emergency  ED Discharge Orders    None       Gareth Morgan, MD 05/01/19 2030

## 2019-05-02 DIAGNOSIS — I5032 Chronic diastolic (congestive) heart failure: Secondary | ICD-10-CM | POA: Diagnosis present

## 2019-05-02 DIAGNOSIS — E669 Obesity, unspecified: Secondary | ICD-10-CM | POA: Diagnosis present

## 2019-05-02 DIAGNOSIS — Z20828 Contact with and (suspected) exposure to other viral communicable diseases: Secondary | ICD-10-CM | POA: Diagnosis present

## 2019-05-02 DIAGNOSIS — M810 Age-related osteoporosis without current pathological fracture: Secondary | ICD-10-CM | POA: Diagnosis present

## 2019-05-02 DIAGNOSIS — R471 Dysarthria and anarthria: Secondary | ICD-10-CM | POA: Diagnosis present

## 2019-05-02 DIAGNOSIS — J9611 Chronic respiratory failure with hypoxia: Secondary | ICD-10-CM | POA: Diagnosis present

## 2019-05-02 DIAGNOSIS — J449 Chronic obstructive pulmonary disease, unspecified: Secondary | ICD-10-CM | POA: Diagnosis present

## 2019-05-02 DIAGNOSIS — K219 Gastro-esophageal reflux disease without esophagitis: Secondary | ICD-10-CM | POA: Diagnosis present

## 2019-05-02 DIAGNOSIS — E785 Hyperlipidemia, unspecified: Secondary | ICD-10-CM | POA: Diagnosis present

## 2019-05-02 DIAGNOSIS — R4701 Aphasia: Secondary | ICD-10-CM

## 2019-05-02 DIAGNOSIS — Z79899 Other long term (current) drug therapy: Secondary | ICD-10-CM | POA: Diagnosis not present

## 2019-05-02 DIAGNOSIS — E871 Hypo-osmolality and hyponatremia: Secondary | ICD-10-CM | POA: Diagnosis present

## 2019-05-02 DIAGNOSIS — E119 Type 2 diabetes mellitus without complications: Secondary | ICD-10-CM | POA: Diagnosis present

## 2019-05-02 DIAGNOSIS — J42 Unspecified chronic bronchitis: Secondary | ICD-10-CM | POA: Diagnosis not present

## 2019-05-02 DIAGNOSIS — G9341 Metabolic encephalopathy: Secondary | ICD-10-CM | POA: Diagnosis present

## 2019-05-02 DIAGNOSIS — Z7951 Long term (current) use of inhaled steroids: Secondary | ICD-10-CM | POA: Diagnosis not present

## 2019-05-02 DIAGNOSIS — F411 Generalized anxiety disorder: Secondary | ICD-10-CM | POA: Diagnosis not present

## 2019-05-02 DIAGNOSIS — D751 Secondary polycythemia: Secondary | ICD-10-CM | POA: Diagnosis present

## 2019-05-02 DIAGNOSIS — I169 Hypertensive crisis, unspecified: Secondary | ICD-10-CM | POA: Diagnosis not present

## 2019-05-02 DIAGNOSIS — Z881 Allergy status to other antibiotic agents status: Secondary | ICD-10-CM | POA: Diagnosis not present

## 2019-05-02 DIAGNOSIS — F250 Schizoaffective disorder, bipolar type: Secondary | ICD-10-CM | POA: Diagnosis present

## 2019-05-02 DIAGNOSIS — I110 Hypertensive heart disease with heart failure: Secondary | ICD-10-CM | POA: Diagnosis present

## 2019-05-02 DIAGNOSIS — F17210 Nicotine dependence, cigarettes, uncomplicated: Secondary | ICD-10-CM | POA: Diagnosis present

## 2019-05-02 DIAGNOSIS — Z6831 Body mass index (BMI) 31.0-31.9, adult: Secondary | ICD-10-CM | POA: Diagnosis not present

## 2019-05-02 DIAGNOSIS — Z880 Allergy status to penicillin: Secondary | ICD-10-CM | POA: Diagnosis not present

## 2019-05-02 DIAGNOSIS — F419 Anxiety disorder, unspecified: Secondary | ICD-10-CM | POA: Diagnosis present

## 2019-05-02 DIAGNOSIS — I161 Hypertensive emergency: Secondary | ICD-10-CM | POA: Diagnosis present

## 2019-05-02 LAB — BASIC METABOLIC PANEL
Anion gap: 11 (ref 5–15)
BUN: 20 mg/dL (ref 6–20)
CO2: 23 mmol/L (ref 22–32)
Calcium: 9.1 mg/dL (ref 8.9–10.3)
Chloride: 100 mmol/L (ref 98–111)
Creatinine, Ser: 0.62 mg/dL (ref 0.44–1.00)
GFR calc Af Amer: >60 mL/min (ref >60)
GFR calc non Af Amer: >60 mL/min (ref >60)
Glucose, Bld: 106 mg/dL — ABNORMAL HIGH (ref 70–99)
Potassium: 3.4 mmol/L — ABNORMAL LOW (ref 3.5–5.1)
Sodium: 134 mmol/L — ABNORMAL LOW (ref 135–145)

## 2019-05-02 LAB — CBC
HCT: 42 % (ref 36.0–46.0)
Hemoglobin: 13.9 g/dL (ref 12.0–15.0)
MCH: 28.1 pg (ref 26.0–34.0)
MCHC: 33.1 g/dL (ref 30.0–36.0)
MCV: 84.8 fL (ref 80.0–100.0)
Platelets: 206 10*3/uL (ref 150–400)
RBC: 4.95 MIL/uL (ref 3.87–5.11)
RDW: 13.8 % (ref 11.5–15.5)
WBC: 6.8 10*3/uL (ref 4.0–10.5)
nRBC: 0 % (ref 0.0–0.2)

## 2019-05-02 LAB — GLUCOSE, CAPILLARY
Glucose-Capillary: 90 mg/dL (ref 70–99)
Glucose-Capillary: 138 mg/dL — ABNORMAL HIGH (ref 70–99)
Glucose-Capillary: 160 mg/dL — ABNORMAL HIGH (ref 70–99)

## 2019-05-02 LAB — URINALYSIS, ROUTINE W REFLEX MICROSCOPIC
Bacteria, UA: NONE SEEN
Bilirubin Urine: NEGATIVE
Glucose, UA: NEGATIVE mg/dL
Hgb urine dipstick: NEGATIVE
Ketones, ur: 20 mg/dL — AB
Leukocytes,Ua: NEGATIVE
Nitrite: NEGATIVE
Protein, ur: NEGATIVE mg/dL
Specific Gravity, Urine: 1.033 — ABNORMAL HIGH (ref 1.005–1.030)
pH: 6 (ref 5.0–8.0)

## 2019-05-02 LAB — VALPROIC ACID LEVEL: Valproic Acid Lvl: 53 ug/mL (ref 50.0–100.0)

## 2019-05-02 MED ORDER — HYDROXYZINE HCL 50 MG PO TABS
50.0000 mg | ORAL_TABLET | Freq: Three times a day (TID) | ORAL | Status: DC
  Administered 2019-05-02 – 2019-05-04 (×7): 50 mg via ORAL
  Filled 2019-05-02 (×7): qty 1

## 2019-05-02 MED ORDER — IPRATROPIUM-ALBUTEROL 0.5-2.5 (3) MG/3ML IN SOLN
3.0000 mL | Freq: Two times a day (BID) | RESPIRATORY_TRACT | Status: DC
  Administered 2019-05-02: 3 mL via RESPIRATORY_TRACT
  Filled 2019-05-02: qty 3

## 2019-05-02 MED ORDER — CLONIDINE HCL 0.1 MG PO TABS
0.2000 mg | ORAL_TABLET | Freq: Once | ORAL | Status: AC
  Administered 2019-05-02: 0.2 mg via ORAL
  Filled 2019-05-02: qty 2

## 2019-05-02 NOTE — Progress Notes (Signed)
Progress Note    Natalie Thomas  [id number] DOB: 04-12-1961  DOA: 05/01/2019 PCP: System, Pcp Not In    Brief Narrative:     Medical records reviewed and are as summarized below:  Natalie Thomas is an 58 y.o. female with medical history significant of bipolar/schizoaffective d/o;  HTN; HLD: DM; COPD; chronic hyponatremia; and CHF presenting with difficulty talking.  At baseline at the time of last discharge, she was calm and comfortable, "pleasantly confused, nonsensical speech."  She is currently alert and does not answer questions, but she heard someone yelling down the hall and repeatedly asked what was going on.  Assessment/Plan:   Principal Problem:   Hypertensive crisis Active Problems:   DM (diabetes mellitus), type 2 (HCC)   COPD (chronic obstructive pulmonary disease) (HCC)   Schizoaffective disorder, bipolar type (HCC)   Hyperlipidemia   Chronic diastolic CHF (congestive heart failure) ([place])   Hypertensive crisis with Abnormal EKG -Patient presenting with AMS and speech dysfunction that may be different from baseline -Initially called code stroke but negative MRI and clinical picture is less consistent with this  -She was recently admitted with hypertensive crisis and had marked BP elevation once again today -The patient received Labetalol IV x 1 in the ER with subsequent significant decrease in BP without apparent difficulty -Will continue home medications - Lisinopril, Lopressor, Procardia- goal was to slowly lower BP but with resumption of her home meds BP dropped quicker than anticipated.  DM -A1c 5.2 in 11/2018, well controlled with diet alone  Schizoaffective d/o Psychiatry evaluated on 9/15.  Recommended increasing Saphris 10 mg twice daily for mood stabilization, with close monitoring of QTC.= between 9/21 and now that medication has been d/c'd and I am not sure why She is followed by her psychiatrist, Dr. Lucita Ferrara at her SNF. She also regularly  sees her therapist, Colletta [place]- if here on Monday will need to reach out and discuss -requested MAR from [place] home Xanax, Wellbutrin, [place]. Depakene -She appears to have been taking both Latuda and Seroquel, but both are 2nd generation atypical antipsychotics and likely should not be used together so will hold Seroquel -EEG- no seizures seen apparently attention patient at times is attention seeking  COPD -Hold nebs, will give Combivent and Albuterol -Continue Breo Ellipta -Appears to be compensated at this time  HLD -She does not appear to be taking medications for this issue at this time  Chronic diastolic CHF -Appears to be compensated -12/04/18 echo with preserved EF and "impaired" relaxation   obesity Body mass index is 31.24 kg/m.   Family Communication/Anticipated D/C date and plan/Code Status   DVT prophylaxis: Lovenox ordered. Code Status: Full Code.  Family Communication:  Disposition Plan: pending medical stability    Medical Consultants:    Neuro     Subjective:     Objective:    Vitals:   05/02/19 0940 05/02/19 1028 05/02/19 1029 05/02/19 1137  BP:  (!) 144/60 122/65 (!) 134/53  Pulse:    72  Resp:    18  Temp:    99.3 F (37.4 C)  TempSrc:    Oral  SpO2: 96%   94%  Weight:      Height:        Intake/Output Summary (Last 24 hours) at 05/02/2019 1503 Last data filed at 05/01/2019 2130 Gross per 24 hour  Intake 240 ml  Output --  Net 240 ml   Filed Weights   05/01/19 1100 05/01/19 1437  Weight: 80 kg 80 kg    Exam: Kept eyes shut and would moan. rrr No increased work of breathing Chronically ill appearing   Data Reviewed:   I have personally reviewed following labs and imaging studies:  Labs: Labs show the following:   Basic Metabolic Panel: Recent Labs  Lab 05/01/19 1133 05/01/19 1151 05/02/19 0311  NA 135 133* 134*  K 4.3 3.8 3.4*  CL 102 97* 100  CO2  --  21* 23  GLUCOSE 105* 109* 106*   BUN 24* 16 20  CREATININE 0.50 0.58 0.62  CALCIUM  --  9.9 9.1   GFR Estimated Creatinine Clearance: 77.7 mL/min (by C-G formula based on SCr of 0.62 mg/dL). Liver Function Tests: Recent Labs  Lab 05/01/19 1151  AST 19  ALT 18  ALKPHOS 78  BILITOT 0.9  PROT 7.9  ALBUMIN 3.9   No results for input(s): LIPASE, AMYLASE in the last 168 hours. No results for input(s): AMMONIA in the last 168 hours. Coagulation profile Recent Labs  Lab 05/01/19 1147  INR 1.0    CBC: Recent Labs  Lab 05/01/19 1133 05/01/19 1151 05/02/19 0311  WBC  --  8.5 6.8  NEUTROABS  --  6.5  --   HGB 17.0* 15.9* 13.9  HCT 50.0* 47.7* 42.0  MCV  --  85.5 84.8  PLT  --  205 206   Cardiac Enzymes: No results for input(s): CKTOTAL, CKMB, CKMBINDEX, TROPONINI in the last 168 hours. BNP (last 3 results) No results for input(s): PROBNP in the last 8760 hours. CBG: Recent Labs  Lab 05/01/19 1142 05/02/19 0641 05/02/19 1137  GLUCAP 103* 90 138*   D-Dimer: No results for input(s): DDIMER in the last 72 hours. Hgb A1c: No results for input(s): HGBA1C in the last 72 hours. Lipid Profile: No results for input(s): CHOL, HDL, LDLCALC, TRIG, CHOLHDL, LDLDIRECT in the last 72 hours. Thyroid function studies: No results for input(s): TSH, T4TOTAL, T3FREE, THYROIDAB in the last 72 hours.  Invalid input(s): FREET3 Anemia work up: No results for input(s): VITAMINB12, FOLATE, FERRITIN, TIBC, IRON, RETICCTPCT in the last 72 hours. Sepsis Labs: Recent Labs  Lab 05/01/19 1151 05/02/19 0311  WBC 8.5 6.8    Microbiology Recent Results (from the past 240 hour(s))  SARS CORONAVIRUS 2 (TAT 6-24 HRS) Nasopharyngeal Nasopharyngeal Swab     Status: None   Collection Time: 04/24/19 12:13 AM   Specimen: Nasopharyngeal Swab  Result Value Ref Range Status   SARS Coronavirus 2 NEGATIVE NEGATIVE Final    Comment: (NOTE) SARS-CoV-2 target nucleic acids are NOT DETECTED. The SARS-CoV-2 RNA is generally  detectable in upper and lower respiratory specimens during the acute phase of infection. Negative results do not preclude SARS-CoV-2 infection, do not rule out co-infections with other pathogens, and should not be used as the sole basis for treatment or other patient management decisions. Negative results must be combined with clinical observations, patient history, and epidemiological information. The expected result is Negative. Fact Sheet for Patients: SugarRoll.be Fact Sheet for Healthcare Providers: https://www.woods-mathews.com/ This test is not yet approved or cleared by the [place] FDA and  has been authorized for detection and/or diagnosis of SARS-CoV-2 by FDA under an Emergency Use Authorization (EUA). This EUA will remain  in effect (meaning this test can be used) for the duration of the COVID-19 declaration under Section 56 4(b)(1) of the Act, 21 U.S.C. section 360bbb-3(b)(1), unless the authorization is terminated or revoked sooner. Performed at [facility], [address] [place], [place] [address]   SARS CORONAVIRUS 2 (TAT 6-24 HRS) Nasopharyngeal Nasopharyngeal Swab     Status: None   Collection Time: 05/01/19  1:47 PM   Specimen: Nasopharyngeal Swab  Result Value Ref Range Status   SARS Coronavirus 2 NEGATIVE NEGATIVE Final    Comment: (NOTE) SARS-CoV-2 target nucleic acids are NOT DETECTED. The SARS-CoV-2 RNA is generally detectable in upper and lower respiratory specimens during the acute phase of infection. Negative results do not preclude SARS-CoV-2 infection, do not rule out co-infections with other pathogens, and should not be used as the sole basis for treatment or other patient management decisions. Negative results must be combined with clinical observations, patient history, and epidemiological information. The expected result is Negative. Fact Sheet for  Patients: SugarRoll.be Fact Sheet for Healthcare Providers: https://www.woods-mathews.com/ This test is not yet approved or cleared by the [place] FDA and  has been authorized for detection and/or diagnosis of SARS-CoV-2 by FDA under an Emergency Use Authorization (EUA). This EUA will remain  in effect (meaning this test can be used) for the duration of the COVID-19 declaration under Section 56 4(b)(1) of the Act, 21 U.S.C. section 360bbb-3(b)(1), unless the authorization is terminated or revoked sooner. Performed at [facility], [place] [address] [place], [place] [address]     Procedures and diagnostic studies:  Ct Angio Head W Or Wo Contrast  Result Date: 05/01/2019 CLINICAL DATA:  Pure aphasia.  Dementia EXAM: CT ANGIOGRAPHY HEAD AND NECK CT PERFUSION BRAIN TECHNIQUE: Multidetector CT imaging of the head and neck was performed using the standard protocol during bolus administration of intravenous contrast. Multiplanar CT image reconstructions and MIPs were obtained to evaluate the vascular anatomy. Carotid stenosis measurements (when applicable) are obtained utilizing NASCET criteria, using the distal internal carotid diameter as the denominator. Multiphase CT imaging of the brain was performed following IV bolus contrast injection. Subsequent parametric perfusion maps were calculated using RAPID software. CONTRAST:  Doses not yet known, studies in progress. COMPARISON:  Noncontrast head CT from earlier today FINDINGS: CTA NECK FINDINGS Aortic arch: Extensive atherosclerotic plaque. Bulky calcified plaque at the proximal left subclavian with high-grade stenosis if not occlusion. There is symmetric enhancement in the subclavians. Right carotid system: Common carotid and proximal ICA atherosclerotic calcification. No flow limiting stenosis. ICA tortuosity with nonstenotic kink. Left carotid system: Scattered atherosclerotic calcification of the  common and internal carotids without flow limiting stenosis or ulceration. Vertebral arteries: Severely stenotic or occluded proximal left subclavian with robust reconstitution. 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 Upper chest: Mild dependent atelectasis in the upper lobes. Partially covered ground-glass density in the left upper lobe. Review of the MIP images confirms the above findings CTA HEAD FINDINGS Anterior circulation: Atherosclerotic plaque on the carotid siphons. No branch occlusion or proximal flow limiting stenosis. Negative for aneurysm. Atherosclerotic irregularity of bilateral medium size branches. Posterior circulation: Atherosclerotic plaque on the left V4 segment without flow limiting stenosis. The vertebrobasilar arteries are smooth and patent. No PCA branch occlusion or beading. There is a mild to moderate atheromatous type narrowing at the left P3 segment. Negative for aneurysm Venous sinuses: Negative Anatomic variants: None significant Review of the MIP images confirms the above findings CT Brain Perfusion Findings: ASPECTS: 10 CBF (<30%) Volume: 1mL Perfusion (Tmax>6.0s) volume: 64mL IMPRESSION: 1. No emergent large vessel occlusion  or infarct/penumbra by CT perfusion. 2. Occlusion or pre occlusion at the proximal left subclavian due to bulky calcified plaque. There is symmetric enhancement of the vertebral and subclavian arteries. 3. Moderate intracranial atherosclerosis without proximal flow limiting stenosis. 4. Possible left upper lobe airspace disease, suggest chest x-ray correlation. Electronically Signed   By: Monte Fantasia M.D.   On: 05/01/2019 12:04   Ct Angio Neck W Or Wo Contrast  Result Date: 05/01/2019 CLINICAL DATA:  Pure aphasia.  Dementia EXAM: CT ANGIOGRAPHY HEAD AND NECK CT PERFUSION  BRAIN TECHNIQUE: Multidetector CT imaging of the head and neck was performed using the standard protocol during bolus administration of intravenous contrast. Multiplanar CT image reconstructions and MIPs were obtained to evaluate the vascular anatomy. Carotid stenosis measurements (when applicable) are obtained utilizing NASCET criteria, using the distal internal carotid diameter as the denominator. Multiphase CT imaging of the brain was performed following IV bolus contrast injection. Subsequent parametric perfusion maps were calculated using RAPID software. CONTRAST:  Doses not yet known, studies in progress. COMPARISON:  Noncontrast head CT from earlier today FINDINGS: CTA NECK FINDINGS Aortic arch: Extensive atherosclerotic plaque. Bulky calcified plaque at the proximal left subclavian with high-grade stenosis if not occlusion. There is symmetric enhancement in the subclavians. Right carotid system: Common carotid and proximal ICA atherosclerotic calcification. No flow limiting stenosis. ICA tortuosity with nonstenotic kink. Left carotid system: Scattered atherosclerotic calcification of the common and internal carotids without flow limiting stenosis or ulceration. Vertebral arteries: Severely stenotic or occluded proximal left subclavian with robust reconstitution. 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 Upper chest: Mild dependent atelectasis in the upper lobes. Partially covered ground-glass density in the left upper lobe. Review of the MIP images confirms the above findings CTA HEAD FINDINGS Anterior circulation: Atherosclerotic plaque on the carotid siphons. No branch occlusion or proximal flow limiting stenosis. Negative for aneurysm. Atherosclerotic irregularity of bilateral medium size branches. Posterior circulation:  Atherosclerotic plaque on the left V4 segment without flow limiting stenosis. The vertebrobasilar arteries are smooth and patent. No PCA branch occlusion or beading. There is a mild to moderate atheromatous type narrowing at the left P3 segment. Negative for aneurysm Venous sinuses: Negative Anatomic variants: None significant Review of the MIP images confirms the above findings CT Brain Perfusion Findings: ASPECTS: 10 CBF (<30%) Volume: 54mL Perfusion (Tmax>6.0s) volume: 57mL IMPRESSION: 1. No emergent large vessel occlusion or infarct/penumbra by CT perfusion. 2. Occlusion or pre occlusion at the proximal left subclavian due to bulky calcified plaque. There is symmetric enhancement of the vertebral and subclavian arteries. 3. Moderate intracranial atherosclerosis without proximal flow limiting stenosis. 4. Possible left upper lobe airspace disease, suggest chest x-ray correlation. Electronically Signed   By: Monte Fantasia M.D.   On: 05/01/2019 12:04   Mr Brain Wo Contrast  Result Date: 05/01/2019 CLINICAL DATA:  Stroke. EXAM: MRI HEAD WITHOUT CONTRAST TECHNIQUE: Multiplanar, multiecho pulse sequences of the brain and surrounding structures were obtained without intravenous contrast. COMPARISON:  CTA head 05/01/2019 FINDINGS: Brain: Negative for acute infarct. Small hyperintensities in the cerebral white matter bilaterally and in the central pons most consistent with mild chronic microvascular ischemia. Negative for hemorrhage or mass. Motion degraded image quality Vascular: Normal arterial flow voids Skull and upper cervical spine: Negative Sinuses/Orbits: Mild mucosal edema paranasal sinuses. Right mastoid effusion. Bilateral cataract surgery Other: None IMPRESSION: No acute  abnormality.  Mild chronic microvascular ischemic changes. Electronically Signed   By: Franchot Gallo M.D.   On: 05/01/2019 12:46   Ct Cerebral Perfusion W Contrast  Result Date: 05/01/2019 CLINICAL DATA:  Pure aphasia.  Dementia  EXAM: CT ANGIOGRAPHY HEAD AND NECK CT PERFUSION BRAIN TECHNIQUE: Multidetector CT imaging of the head and neck was performed using the standard protocol during bolus administration of intravenous contrast. Multiplanar CT image reconstructions and MIPs were obtained to evaluate the vascular anatomy. Carotid stenosis measurements (when applicable) are obtained utilizing NASCET criteria, using the distal internal carotid diameter as the denominator. Multiphase CT imaging of the brain was performed following IV bolus contrast injection. Subsequent parametric perfusion maps were calculated using RAPID software. CONTRAST:  Doses not yet known, studies in progress. COMPARISON:  Noncontrast head CT from earlier today FINDINGS: CTA NECK FINDINGS Aortic arch: Extensive atherosclerotic plaque. Bulky calcified plaque at the proximal left subclavian with high-grade stenosis if not occlusion. There is symmetric enhancement in the subclavians. Right carotid system: Common carotid and proximal ICA atherosclerotic calcification. No flow limiting stenosis. ICA tortuosity with nonstenotic kink. Left carotid system: Scattered atherosclerotic calcification of the common and internal carotids without flow limiting stenosis or ulceration. Vertebral arteries: Severely stenotic or occluded proximal left subclavian with robust reconstitution. 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 Upper chest: Mild dependent atelectasis in the upper lobes. Partially covered ground-glass density in the left upper lobe. Review of the MIP images confirms the above findings CTA HEAD FINDINGS Anterior circulation: Atherosclerotic plaque on the carotid siphons. No branch occlusion or proximal flow limiting stenosis. Negative for aneurysm. Atherosclerotic irregularity of bilateral  medium size branches. Posterior circulation: Atherosclerotic plaque on the left V4 segment without flow limiting stenosis. The vertebrobasilar arteries are smooth and patent. No PCA branch occlusion or beading. There is a mild to moderate atheromatous type narrowing at the left P3 segment. Negative for aneurysm Venous sinuses: Negative Anatomic variants: None significant Review of the MIP images confirms the above findings CT Brain Perfusion Findings: ASPECTS: 10 CBF (<30%) Volume: 1mL Perfusion (Tmax>6.0s) volume: 98mL IMPRESSION: 1. No emergent large vessel occlusion or infarct/penumbra by CT perfusion. 2. Occlusion or pre occlusion at the proximal left subclavian due to bulky calcified plaque. There is symmetric enhancement of the vertebral and subclavian arteries. 3. Moderate intracranial atherosclerosis without proximal flow limiting stenosis. 4. Possible left upper lobe airspace disease, suggest chest x-ray correlation. Electronically Signed   By: Monte Fantasia M.D.   On: 05/01/2019 12:04   Ct Head Code Stroke Wo Contrast  Result Date: 05/01/2019 CLINICAL DATA:  Code stroke.  Expressive aphasia EXAM: CT HEAD WITHOUT CONTRAST TECHNIQUE: Contiguous axial images were obtained from the base of the skull through the vertex without intravenous contrast. COMPARISON:  07/04/2018 FINDINGS: Brain: No evidence of acute infarction, hemorrhage, hydrocephalus, extra-axial collection or mass lesion/mass effect. Cerebral volume loss greater than expected for age Vascular: No definite hyperdensity. There is atherosclerotic calcification that is multifocal Skull: Hyperostosis interna. Sinuses/Orbits: Chronic right ear with opacified and sclerotic mastoid. Other: These results were communicated to Dr. Rory Percy at 11:48 amon 11/6/2020by text page via the [facility] messaging system. ASPECTS [facility] Stroke Program Early CT Score) - Ganglionic level infarction (caudate, lentiform nuclei, internal capsule, insula, M1-M3 cortex): 7 -  Supraganglionic infarction (M4-M6 cortex): 3 Total score (0-10 with 10 being normal): 10 IMPRESSION: 1.  No acute finding. 2. ASPECTS is 10. 3. Age advanced cerebral volume loss and atherosclerotic calcification. Electronically Signed   By: Monte Fantasia M.D.   On: 05/01/2019 11:51    Medications:    ALPRAZolam  0.5 mg Oral BID   aspirin EC  81 mg Oral Daily   buPROPion  150 mg Oral Daily   docusate sodium  100 mg Oral BID   enoxaparin (LOVENOX) injection  40 mg Subcutaneous Q24H   feeding supplement (ENSURE ENLIVE)  237 mL Oral Q24H   feeding supplement (PRO-STAT SUGAR FREE 64)  30 mL Oral BID   fluticasone  2 spray Each Nare BID   fluticasone furoate-vilanterol  1 puff Inhalation Daily   gabapentin  100 mg Oral Daily   guaiFENesin  1,200 mg Oral Daily   hydrOXYzine  50 mg Oral TID   ipratropium-albuterol  3 mL Nebulization BID   lisinopril  40 mg Oral Daily   loratadine  10 mg Oral Daily   lurasidone  40 mg Oral BID   Melatonin  6 mg Oral QHS   metoprolol tartrate  25 mg Oral BID   NIFEdipine  90 mg Oral Daily   pantoprazole  40 mg Oral QHS   polyethylene glycol  17 g Oral Daily   sodium chloride  1 g Oral TID WC   valproic acid  500 mg Oral BID   Continuous Infusions:   LOS: 0 days   Geradine Girt  [facility]   How to contact the [facility] Attending or Consulting provider [place] or covering provider during after hours [place], for this patient?  1. Check the care team in [facility] and look for a) attending/consulting [facility] provider listed and b) the [facility] team listed 2. Log into [email or web address] and use [place]'s universal password to access. If you do not have the password, please contact the hospital operator. 3. Locate the [facility] provider you are looking for under [facility] and page to a number that you can be directly reached. 4. If you still have difficulty reaching the provider, please page the [facility] (Director on Call) for the Hospitalists  listed on amion for assistance.  05/02/2019, 3:03 PM

## 2019-05-02 NOTE — Progress Notes (Signed)
Neurology Progress Note   S:// Seen and examined. Remains confused according to the RN. She spoke in some words but not necessarily following commands  I spoke with her legal guardian, social worker Ms. Silvano Rusk, who reported that she has been her Education officer, museum for a while and the patient has been in assisted living because of her deteriorating health.  She was in a group home before for her mental illness and continued to deteriorate in terms of her general health.  She continues to smoke and keeps getting sick with problems related to smoking and complications of that but also at times her presentations have been primarily psychiatric, to paraphrase what I was told-when she needs attention.  The legal guardian can be reached at her numbers listed in the chart and would like to be notified and contacted if there is a need for any procedures or consents as she is authorized to provide that.  I thanked her for her time and will reach back out if I needed more information.    O:// Current vital signs: BP (!) 179/55 (BP Location: Right Arm)   Pulse 81   Temp 98.4 F (36.9 C) (Oral)   Resp 16   Ht 5\' 3"  (1.6 m)   Wt 80 kg   SpO2 96%   BMI 31.24 kg/m  Vital signs in last 24 hours: Temp:  [98.1 F (36.7 C)-98.5 F (36.9 C)] 98.4 F (36.9 C) (11/07 0814) Pulse Rate:  [75-99] 81 (11/07 0814) Resp:  [14-28] 16 (11/07 0814) BP: (161-231)/(55-94) 179/55 (11/07 0814) SpO2:  [92 %-98 %] 96 % (11/07 0814) Weight:  [80 kg] 80 kg (11/06 1437) Neurological exam Comfortably sleeping in bed.  Upon waking up, appears surprised and then closes her eyes and resists eye opening.  Neck is supple She was not able to tell me her name but when I asked her if her name is Natalie Thomas, she said yes. She then said her name.  When I asked her age, she did not respond. She did not follow commands but was able to mimic some. When I was passively moving her limbs, she kept saying "oh my God". Cranial  nerves: Pupils are equal round reactive light, blinks to threat from both sides, extraocular movements are intact, face is symmetric. Motor exam: She moves all 4 extremities antigravity and symmetrically.  Did not cooperate with a formal exam for individual muscle testing. Sensory exam: Intact to light touch and noxious stimulation.  Medications  Current Facility-Administered Medications:  .  acetaminophen (TYLENOL) tablet 650 mg, 650 mg, Oral, Q6H PRN **OR** acetaminophen (TYLENOL) suppository 650 mg, 650 mg, Rectal, Q6H PRN, Karmen Bongo, MD .  albuterol (PROVENTIL) (2.5 MG/3ML) 0.083% nebulizer solution 3 mL, 3 mL, Inhalation, Q4H PRN, Karmen Bongo, MD .  ALPRAZolam Duanne Moron) tablet 0.5 mg, 0.5 mg, Oral, BID, Karmen Bongo, MD, 0.5 mg at 05/01/19 2256 .  aspirin EC tablet 81 mg, 81 mg, Oral, Daily, Karmen Bongo, MD .  buPROPion (WELLBUTRIN XL) 24 hr tablet 150 mg, 150 mg, Oral, Daily, Karmen Bongo, MD .  docusate sodium (COLACE) capsule 100 mg, 100 mg, Oral, BID, Karmen Bongo, MD, 100 mg at 05/01/19 2258 .  enoxaparin (LOVENOX) injection 40 mg, 40 mg, Subcutaneous, Q24H, Karmen Bongo, MD, 40 mg at 05/01/19 2023 .  feeding supplement (ENSURE ENLIVE) (ENSURE ENLIVE) liquid 237 mL, 237 mL, Oral, Q24H, Karmen Bongo, MD .  feeding supplement (PRO-STAT SUGAR FREE 64) liquid 30 mL, 30 mL, Oral, BID, Karmen Bongo,  MD, 30 mL at 05/01/19 2259 .  fluticasone (FLONASE) 50 MCG/ACT nasal spray 2 spray, 2 spray, Each Nare, BID, Karmen Bongo, MD, 2 spray at 05/01/19 2259 .  fluticasone furoate-vilanterol (BREO ELLIPTA) 100-25 MCG/INH 1 puff, 1 puff, Inhalation, Daily, Karmen Bongo, MD .  gabapentin (NEURONTIN) capsule 100 mg, 100 mg, Oral, Daily, Karmen Bongo, MD .  guaiFENesin [facility]) 12 hr tablet 1,200 mg, 1,200 mg, Oral, Daily, Karmen Bongo, MD .  guaifenesin (ROBITUSSIN) 100 MG/5ML syrup 200 mg, 200 mg, Oral, TID PRN, Karmen Bongo, MD .  hydrALAZINE (APRESOLINE)  injection 5 mg, 5 mg, Intravenous, Q4H PRN, Karmen Bongo, MD, 5 mg at 05/02/19 [id number] .  hydrOXYzine (ATARAX/VISTARIL) tablet 50 mg, 50 mg, Oral, TID, Karmen Bongo, MD .  ipratropium-albuterol (DUONEB) 0.5-2.5 (3) MG/3ML nebulizer solution 3 mL, 3 mL, Nebulization, TID, Karmen Bongo, MD .  lisinopril (ZESTRIL) tablet 40 mg, 40 mg, Oral, Daily, Karmen Bongo, MD .  loratadine (CLARITIN) tablet 10 mg, 10 mg, Oral, Daily, Karmen Bongo, MD .  lurasidone (LATUDA) tablet 40 mg, 40 mg, Oral, BID, Karmen Bongo, MD, 40 mg at 05/01/19 2303 .  Melatonin TABS 6 mg, 6 mg, Oral, QHS, Karmen Bongo, MD, 6 mg at 05/01/19 2301 .  methocarbamol (ROBAXIN) tablet 500 mg, 500 mg, Oral, Q8H PRN, Karmen Bongo, MD .  metoprolol tartrate (LOPRESSOR) tablet 25 mg, 25 mg, Oral, BID, Karmen Bongo, MD, 25 mg at 05/01/19 2304 .  NIFEdipine (PROCARDIA XL/NIFEDICAL-XL) 24 hr tablet 90 mg, 90 mg, Oral, Daily, Karmen Bongo, MD .  ondansetron [facility]) tablet 4 mg, 4 mg, Oral, Q6H PRN **OR** ondansetron (ZOFRAN) injection 4 mg, 4 mg, Intravenous, Q6H PRN, Karmen Bongo, MD .  oxyCODONE (Oxy IR/ROXICODONE) immediate release tablet 5 mg, 5 mg, Oral, Q12H PRN, Karmen Bongo, MD .  pantoprazole (PROTONIX) EC tablet 40 mg, 40 mg, Oral, QHS, Karmen Bongo, MD, 40 mg at 05/01/19 2300 .  polyethylene glycol (MIRALAX / GLYCOLAX) packet 17 g, 17 g, Oral, Daily, Karmen Bongo, MD .  sodium chloride tablet 1 g, 1 g, Oral, TID WC, Karmen Bongo, MD .  valproic acid (DEPAKENE) 250 MG capsule 500 mg, 500 mg, Oral, BID, Karmen Bongo, MD, 500 mg at 05/01/19 2252 Labs CBC    Component Value Date/Time   WBC 6.8 05/02/2019 0311   RBC 4.95 05/02/2019 0311   HGB 13.9 05/02/2019 0311   HGB 14.4 06/22/2012 0014   HCT 42.0 05/02/2019 0311   HCT 43.6 06/22/2012 0014   PLT 206 05/02/2019 0311   PLT 189 06/22/2012 0014   MCV 84.8 05/02/2019 0311   MCV 89 06/22/2012 0014   MCH 28.1 05/02/2019 0311   MCHC 33.1  05/02/2019 0311   RDW 13.8 05/02/2019 0311   RDW 17.4 (H) 06/22/2012 0014   LYMPHSABS 1.3 05/01/2019 1151   LYMPHSABS 0.6 (L) 06/01/2012 0458   MONOABS 0.6 05/01/2019 1151   MONOABS 0.2 06/01/2012 0458   EOSABS 0.0 05/01/2019 1151   EOSABS 0.0 06/01/2012 0458   BASOSABS 0.0 05/01/2019 1151   BASOSABS 0.0 06/01/2012 0458    CMP     Component Value Date/Time   NA 134 (L) 05/02/2019 0311   NA 128 (L) 06/23/2012 0536   K 3.4 (L) 05/02/2019 0311   K 4.3 06/23/2012 0536   CL 100 05/02/2019 0311   CL 92 (L) 06/23/2012 0536   CO2 23 05/02/2019 0311   CO2 28 06/23/2012 0536   GLUCOSE 106 (H) 05/02/2019 0311   GLUCOSE 237 (H) 06/23/2012  0536   BUN 20 05/02/2019 0311   BUN 4 (L) 06/23/2012 0536   CREATININE 0.62 05/02/2019 0311   CREATININE 0.42 (L) 06/23/2012 0536   CALCIUM 9.1 05/02/2019 0311   CALCIUM 9.1 06/23/2012 0536   CALCIUM 9.3 04/09/2011 1058   PROT 7.9 05/01/2019 1151   PROT 6.9 06/22/2012 0014   ALBUMIN 3.9 05/01/2019 1151   ALBUMIN 2.8 (L) 06/22/2012 0014   AST 19 05/01/2019 1151   AST 10 (L) 06/22/2012 0014   ALT 18 05/01/2019 1151   ALT 11 (L) 06/22/2012 0014   ALKPHOS 78 05/01/2019 1151   ALKPHOS 81 06/22/2012 0014   BILITOT 0.9 05/01/2019 1151   BILITOT 0.2 06/22/2012 0014   GFRNONAA >60 05/02/2019 0311   GFRNONAA >60 06/23/2012 0536   GFRAA >60 05/02/2019 0311   GFRAA >60 06/23/2012 0536   Imaging I have reviewed images in epic and the results pertinent to this consultation are: CT head unremarkable for acute process. CTA head and neck with no emergent LVO, multiple other areas of stenosis along with intracranial atherosclerotic disease. MRI examination of the brain showed no acute stroke.  Assessment: 58 year old woman with past history of anxiety, depression, schizoaffective disorder bipolar type, CHF, diabetes, hypertension, hyperlipidemia, chronic hyponatremia tobacco abuse, resident of a [place] memory care center, brought  in for concerns of speech difficulty. She was last seen normal yesterday at 8:30 AM and brought in as a code stroke for the speech symptoms. Systolic blood pressures were in the 200s. Overnight pressures have been better but still trending in the 170s 180s. Nonfocal exam other than aphasia. MRI negative for acute stroke. Likely hypertensive urgency versus behavioral change from or related to her existing psychiatric illnesses.  Recommendations: --Strict blood pressure control.  Goal should be to keep the blood pressure less than 180 and then gradually reduce it by 20% every day towards normotension upon discharge. --Pending EEG results --I do not see need for further neuroimaging. --Might consider getting inpatient psychiatry consultation  Discussed with Dr. Eliseo Squires, primary hospitalist.  -- Natalie Portland, MD [facility] Pager: [phone] If 7pm to 7am, please call on call as listed on AMION.

## 2019-05-02 NOTE — Progress Notes (Signed)
Bladder scanned pt with volume of 566ml of urine. In and Out cathed 844ml of urine.

## 2019-05-02 NOTE — Procedures (Signed)
Patient Name: EMMALYN COWDIN  MRN: [id number]  Epilepsy Attending: Lora Havens  Referring Physician/Provider: Dr Karmen Bongo Date: 05/01/2019 Duration: 23.09 mins  Patient history: 58yo F with speech difficulty. EEG to evaluate for seizure.  Level of alertness: awake/lethargic  AEDs during EEG study: None  Technical aspects: This EEG study was done with scalp electrodes positioned according to the 10-20 International system of electrode placement. Electrical activity was acquired at a sampling rate of 500Hz  and reviewed with a high frequency filter of 70Hz  and a low frequency filter of 1Hz . EEG data were recorded continuously and digitally stored.   DESCRIPTION: The posterior dominant rhythm consists of 9-10 Hz activity of moderate voltage (25-35 uV) seen predominantly in posterior head regions, symmetric and reactive to eye opening and eye closing. There was also continuous 3-5hz  theta-delta slowing.       Hyperventilation and photic stimulation were not performed.  Of note, there was significant myogenic and chewing artifact.  IMPRESSION - Continuous slow, generalized  IMPRESSION: This technically difficult study is suggestive of mild diffuse encephalopathy, non specific to etiology. No seizures or epileptiform discharges were seen throughout the recording.   Krystalynn Ridgeway Barbra Sarks

## 2019-05-02 NOTE — Progress Notes (Signed)
Spoke with receptionist at [facility] about sending medication list via Fax. Receptionist said that they will send it as soon as they can.

## 2019-05-03 LAB — BASIC METABOLIC PANEL
Anion gap: 9 (ref 5–15)
BUN: 23 mg/dL — ABNORMAL HIGH (ref 6–20)
CO2: 24 mmol/L (ref 22–32)
Calcium: 8.9 mg/dL (ref 8.9–10.3)
Chloride: 103 mmol/L (ref 98–111)
Creatinine, Ser: 0.55 mg/dL (ref 0.44–1.00)
GFR calc Af Amer: >60 mL/min (ref >60)
GFR calc non Af Amer: >60 mL/min (ref >60)
Glucose, Bld: 107 mg/dL — ABNORMAL HIGH (ref 70–99)
Potassium: 3.4 mmol/L — ABNORMAL LOW (ref 3.5–5.1)
Sodium: 136 mmol/L (ref 135–145)

## 2019-05-03 LAB — CBC
HCT: 40.6 % (ref 36.0–46.0)
Hemoglobin: 13.4 g/dL (ref 12.0–15.0)
MCH: 28.2 pg (ref 26.0–34.0)
MCHC: 33 g/dL (ref 30.0–36.0)
MCV: 85.5 fL (ref 80.0–100.0)
Platelets: 206 10*3/uL (ref 150–400)
RBC: 4.75 MIL/uL (ref 3.87–5.11)
RDW: 13.9 % (ref 11.5–15.5)
WBC: 7.2 10*3/uL (ref 4.0–10.5)
nRBC: 0 % (ref 0.0–0.2)

## 2019-05-03 LAB — GLUCOSE, CAPILLARY
Glucose-Capillary: 107 mg/dL — ABNORMAL HIGH (ref 70–99)
Glucose-Capillary: 109 mg/dL — ABNORMAL HIGH (ref 70–99)
Glucose-Capillary: 96 mg/dL (ref 70–99)
Glucose-Capillary: 92 mg/dL (ref 70–99)

## 2019-05-03 MED ORDER — IPRATROPIUM-ALBUTEROL 0.5-2.5 (3) MG/3ML IN SOLN
3.0000 mL | Freq: Three times a day (TID) | RESPIRATORY_TRACT | Status: DC
  Administered 2019-05-03 – 2019-05-05 (×7): 3 mL via RESPIRATORY_TRACT
  Filled 2019-05-03 (×7): qty 3

## 2019-05-03 NOTE — Progress Notes (Signed)
Progress Note    Natalie Thomas  [id number] DOB: Nov 30, 1960  DOA: 05/01/2019 PCP: System, Pcp Not In    Brief Narrative:     Medical records reviewed and are as summarized below:  Natalie Thomas is an 57 y.o. female with medical history significant of bipolar/schizoaffective d/o;  HTN; HLD: DM; COPD; chronic hyponatremia; and CHF presenting with difficulty talking.  At baseline at the time of last discharge, she was calm and comfortable, "pleasantly confused, nonsensical speech."    Assessment/Plan:   Principal Problem:   Hypertensive crisis Active Problems:   DM (diabetes mellitus), type 2 (HCC)   COPD (chronic obstructive pulmonary disease) (HCC)   Schizoaffective disorder, bipolar type ([place])   Hyperlipidemia   Chronic diastolic CHF (congestive heart failure) ([place])   Hypertensive crisis with Abnormal EKG -Patient presenting with AMS and speech dysfunction that may be different from baseline -Initially called code stroke but negative MRI and clinical picture is less consistent with this  -She was recently admitted with hypertensive crisis and had marked BP elevation once again today -The patient received Labetalol IV x 1 in the ER with subsequent significant decrease in BP without apparent difficulty -Will continue home medications - Lisinopril, Lopressor, Procardia- BP controlled  DM -A1c 5.2 in 11/2018, well controlled with diet alone  Schizoaffective d/o Psychiatry evaluated on 9/15.  Recommended increasing Saphris 10 mg twice daily for mood stabilization, with close monitoring of QTC.= between 9/21 and now that medication has been d/c'd and I am not sure why She is followed by her psychiatrist, Dr. Lucita Ferrara at her SNF. She also regularly sees her therapist, Colletta Maryland- if here on Monday will need to reach out and discuss -requested MAR from [facility] but still have not received  -Continue home Xanax, Wellbutrin, Latuda. Depakene -She appears to have been  taking both Latuda and Seroquel, but both are 2nd generation atypical antipsychotics and likely should not be used together so will hold Seroquel -EEG- no seizures seen -apparently patient at times is attention seeking -psych consult for mediation maximization-- specifically is latuda causing Side effects ? She may be back to her baseline: per d/c summary on 10/30: pleasantly confused and tangential in regards to her speech pattern.    COPD -Hold nebs, will give Combivent and Albuterol -Continue Breo Ellipta -Appears to be compensated at this time  HLD -She does not appear to be taking medications for this issue at this time  Chronic diastolic CHF -Appears to be compensated -12/04/18 echo with preserved EF and "impaired" relaxation  obesity Body mass index is 31.24 kg/m.   Family Communication/Anticipated D/C date and plan/Code Status   DVT prophylaxis: Lovenox ordered. Code Status: Full Code.  Family Communication:  Disposition Plan: pending medical stability    Medical Consultants:    Neuro  psych     Subjective:   Per nursing and neurology was talking this AM-- did not talk to me but after I left spoke with nurse  Objective:    Vitals:   05/03/19 0745 05/03/19 0809 05/03/19 1224 05/03/19 1313  BP:  (!) 157/73 (!) 159/53   Pulse:  70 66   Resp:  15 16   Temp:  98.6 F (37 C) 98.3 F (36.8 C)   TempSrc:  Oral Oral   SpO2: 95% 97% 94% 94%  Weight:      Height:        Intake/Output Summary (Last 24 hours) at 05/03/2019 1324 Last data filed at 05/02/2019 2038  Gross per 24 hour  Intake 240 ml  Output 826 ml  Net -586 ml   Filed Weights   05/01/19 1100 05/01/19 1437  Weight: 80 kg 80 kg    Exam: More alert and per nursing talking- would not speak with me Purposefully ignoring me NAD rrr No increased work of breathing No teeth    Data Reviewed:   I have personally reviewed following labs and imaging studies:  Labs: Labs show the  following:   Basic Metabolic Panel: Recent Labs  Lab 05/01/19 1133 05/01/19 1151 05/02/19 0311 05/03/19 0220  NA 135 133* 134* 136  K 4.3 3.8 3.4* 3.4*  CL 102 97* 100 103  CO2  --  21* 23 24  GLUCOSE 105* 109* 106* 107*  BUN 24* 16 20 23*  CREATININE 0.50 0.58 0.62 0.55  CALCIUM  --  9.9 9.1 8.9   GFR Estimated Creatinine Clearance: 77.7 mL/min (by C-G formula based on SCr of 0.55 mg/dL). Liver Function Tests: Recent Labs  Lab 05/01/19 1151  AST 19  ALT 18  ALKPHOS 78  BILITOT 0.9  PROT 7.9  ALBUMIN 3.9   No results for input(s): LIPASE, AMYLASE in the last 168 hours. No results for input(s): AMMONIA in the last 168 hours. Coagulation profile Recent Labs  Lab 05/01/19 1147  INR 1.0    CBC: Recent Labs  Lab 05/01/19 1133 05/01/19 1151 05/02/19 0311 05/03/19 0220  WBC  --  8.5 6.8 7.2  NEUTROABS  --  6.5  --   --   HGB 17.0* 15.9* 13.9 13.4  HCT 50.0* 47.7* 42.0 40.6  MCV  --  85.5 84.8 85.5  PLT  --  205 206 206   Cardiac Enzymes: No results for input(s): CKTOTAL, CKMB, CKMBINDEX, TROPONINI in the last 168 hours. BNP (last 3 results) No results for input(s): PROBNP in the last 8760 hours. CBG: Recent Labs  Lab 05/02/19 0641 05/02/19 1137 05/02/19 2120 05/03/19 0608 05/03/19 1138  GLUCAP 90 138* 160* 107* 109*   D-Dimer: No results for input(s): DDIMER in the last 72 hours. Hgb A1c: No results for input(s): HGBA1C in the last 72 hours. Lipid Profile: No results for input(s): CHOL, HDL, LDLCALC, TRIG, CHOLHDL, LDLDIRECT in the last 72 hours. Thyroid function studies: No results for input(s): TSH, T4TOTAL, T3FREE, THYROIDAB in the last 72 hours.  Invalid input(s): FREET3 Anemia work up: No results for input(s): VITAMINB12, FOLATE, FERRITIN, TIBC, IRON, RETICCTPCT in the last 72 hours. Sepsis Labs: Recent Labs  Lab 05/01/19 1151 05/02/19 0311 05/03/19 0220  WBC 8.5 6.8 7.2    Microbiology Recent Results (from the past 240 hour(s))   SARS CORONAVIRUS 2 (TAT 6-24 HRS) Nasopharyngeal Nasopharyngeal Swab     Status: None   Collection Time: 04/24/19 12:13 AM   Specimen: Nasopharyngeal Swab  Result Value Ref Range Status   SARS Coronavirus 2 NEGATIVE NEGATIVE Final    Comment: (NOTE) SARS-CoV-2 target nucleic acids are NOT DETECTED. The SARS-CoV-2 RNA is generally detectable in upper and lower respiratory specimens during the acute phase of infection. Negative results do not preclude SARS-CoV-2 infection, do not rule out co-infections with other pathogens, and should not be used as the sole basis for treatment or other patient management decisions. Negative results must be combined with clinical observations, patient history, and epidemiological information. The expected result is Negative. Fact Sheet for Patients: SugarRoll.be Fact Sheet for Healthcare Providers: https://www.woods-mathews.com/ This test is not yet approved or cleared by the [place] FDA  and  has been authorized for detection and/or diagnosis of SARS-CoV-2 by FDA under an Emergency Use Authorization (EUA). This EUA will remain  in effect (meaning this test can be used) for the duration of the COVID-19 declaration under Section 56 4(b)(1) of the Act, 21 U.S.C. section 360bbb-3(b)(1), unless the authorization is terminated or revoked sooner. Performed at [facility], [place] [address], [place], [place] [address]   SARS CORONAVIRUS 2 (TAT 6-24 HRS) Nasopharyngeal Nasopharyngeal Swab     Status: None   Collection Time: 05/01/19  1:47 PM   Specimen: Nasopharyngeal Swab  Result Value Ref Range Status   SARS Coronavirus 2 NEGATIVE NEGATIVE Final    Comment: (NOTE) SARS-CoV-2 target nucleic acids are NOT DETECTED. The SARS-CoV-2 RNA is generally detectable in upper and lower respiratory specimens during the acute phase of infection. Negative results do not preclude SARS-CoV-2 infection, do not rule out  co-infections with other pathogens, and should not be used as the sole basis for treatment or other patient management decisions. Negative results must be combined with clinical observations, patient history, and epidemiological information. The expected result is Negative. Fact Sheet for Patients: SugarRoll.be Fact Sheet for Healthcare Providers: https://www.woods-mathews.com/ This test is not yet approved or cleared by the [place] FDA and  has been authorized for detection and/or diagnosis of SARS-CoV-2 by FDA under an Emergency Use Authorization (EUA). This EUA will remain  in effect (meaning this test can be used) for the duration of the COVID-19 declaration under Section 56 4(b)(1) of the Act, 21 U.S.C. section 360bbb-3(b)(1), unless the authorization is terminated or revoked sooner. Performed at [facility], [place] [address], [place], [place] [address]     Procedures and diagnostic studies:  No results found.  Medications:   . ALPRAZolam  0.5 mg Oral BID  . aspirin EC  81 mg Oral Daily  . buPROPion  150 mg Oral Daily  . docusate sodium  100 mg Oral BID  . enoxaparin (LOVENOX) injection  40 mg Subcutaneous Q24H  . feeding supplement (ENSURE ENLIVE)  237 mL Oral Q24H  . feeding supplement (PRO-STAT SUGAR FREE 64)  30 mL Oral BID  . fluticasone  2 spray Each Nare BID  . fluticasone furoate-vilanterol  1 puff Inhalation Daily  . gabapentin  100 mg Oral Daily  . guaiFENesin  1,200 mg Oral Daily  . hydrOXYzine  50 mg Oral TID  . ipratropium-albuterol  3 mL Nebulization TID  . lisinopril  40 mg Oral Daily  . loratadine  10 mg Oral Daily  . lurasidone  40 mg Oral BID  . Melatonin  6 mg Oral QHS  . metoprolol tartrate  25 mg Oral BID  . NIFEdipine  90 mg Oral Daily  . pantoprazole  40 mg Oral QHS  . polyethylene glycol  17 g Oral Daily  . sodium chloride  1 g Oral TID WC  . valproic acid  500 mg Oral BID   Continuous  Infusions:   LOS: 1 day   Geradine Girt  [facility]   How to contact the [facility] Attending or Consulting provider [place] or covering provider during after hours [place], for this patient?  1. Check the care team in [facility] and look for a) attending/consulting TRH provider listed and b) the [facility] team listed 2. Log into [email or web address] and use [place]'s universal password to access. If you do not have the password, please contact the hospital operator. 3. Locate the  [place] provider you are looking for under [facility] and page to a number that you can be directly reached. 4. If you still have difficulty reaching the provider, please page the [facility] (Director on Call) for the Hospitalists listed on amion for assistance.  05/03/2019, 1:24 PM

## 2019-05-03 NOTE — Progress Notes (Signed)
Neurology Progress Note   S:// Seen and examined.  More awake and following more commands per RN.   O:// Current vital signs: BP (!) 157/73 (BP Location: Left Arm)   Pulse 70   Temp 98.6 F (37 C) (Oral)   Resp 15   Ht 5\' 3"  (1.6 m)   Wt 80 kg   SpO2 97%   BMI 31.24 kg/m  Vital signs in last 24 hours: Temp:  [98 F (36.7 C)-99.3 F (37.4 C)] 98.6 F (37 C) (11/08 0809) Pulse Rate:  [70-91] 70 (11/08 0809) Resp:  [15-20] 15 (11/08 0809) BP: (122-157)/(53-77) 157/73 (11/08 0809) SpO2:  [94 %-98 %] 97 % (11/08 0809) Neurological exam She is awake alert. Does not have good eye contact Is able to tell me her name today. Speech is dysarthric Does not cooperate with exam to name or repeat. Cranial nerves: Pupils equal round react light, extraocular movements intact, face grossly symmetri with the caveat that she is edentulous.  Tongue palate midline Motor exam: She has a resting tremor.  No cogwheeling.  Antigravity in both upper extremities.  Is able to wiggle toes to command on both lower extremities. Sensory exam: Withdraws strongly in all 4 extremities Coordination: Did not cooperate with exam to follow finger-nose-finger or heel-knee-shin testing  Medications  Current Facility-Administered Medications:  .  acetaminophen (TYLENOL) tablet 650 mg, 650 mg, Oral, Q6H PRN **OR** acetaminophen (TYLENOL) suppository 650 mg, 650 mg, Rectal, Q6H PRN, Karmen Bongo, MD .  albuterol (PROVENTIL) (2.5 MG/3ML) 0.083% nebulizer solution 3 mL, 3 mL, Inhalation, Q4H PRN, Karmen Bongo, MD .  ALPRAZolam Duanne Moron) tablet 0.5 mg, 0.5 mg, Oral, BID, Karmen Bongo, MD, 0.5 mg at 05/03/19 0859 .  aspirin EC tablet 81 mg, 81 mg, Oral, Daily, Karmen Bongo, MD, 81 mg at 05/03/19 0856 .  buPROPion (WELLBUTRIN XL) 24 hr tablet 150 mg, 150 mg, Oral, Daily, Karmen Bongo, MD, 150 mg at 05/03/19 0858 .  docusate sodium (COLACE) capsule 100 mg, 100 mg, Oral, BID, Karmen Bongo, MD, 100 mg at  05/03/19 0856 .  enoxaparin (LOVENOX) injection 40 mg, 40 mg, Subcutaneous, Q24H, Karmen Bongo, MD, 40 mg at 05/02/19 2019 .  feeding supplement (ENSURE ENLIVE) (ENSURE ENLIVE) liquid 237 mL, 237 mL, Oral, Q24H, Karmen Bongo, MD .  feeding supplement (PRO-STAT SUGAR FREE 64) liquid 30 mL, 30 mL, Oral, BID, Karmen Bongo, MD, 30 mL at 05/03/19 0900 .  fluticasone (FLONASE) 50 MCG/ACT nasal spray 2 spray, 2 spray, Each Nare, BID, Karmen Bongo, MD, 2 spray at 05/03/19 0900 .  fluticasone furoate-vilanterol (BREO ELLIPTA) 100-25 MCG/INH 1 puff, 1 puff, Inhalation, Daily, Karmen Bongo, MD, 1 puff at 05/03/19 0744 .  gabapentin (NEURONTIN) capsule 100 mg, 100 mg, Oral, Daily, Karmen Bongo, MD, 100 mg at 05/03/19 0859 .  guaiFENesin (MUCINEX) 12 hr tablet 1,200 mg, 1,200 mg, Oral, Daily, Karmen Bongo, MD, 1,200 mg at 05/03/19 0857 .  guaifenesin (ROBITUSSIN) 100 MG/5ML syrup 200 mg, 200 mg, Oral, TID PRN, Karmen Bongo, MD .  hydrALAZINE (APRESOLINE) injection 5 mg, 5 mg, Intravenous, Q4H PRN, Karmen Bongo, MD, 5 mg at 05/02/19 0517 .  hydrOXYzine (ATARAX/VISTARIL) tablet 50 mg, 50 mg, Oral, TID, Karmen Bongo, MD, 50 mg at 05/03/19 0900 .  ipratropium-albuterol (DUONEB) 0.5-2.5 (3) MG/3ML nebulizer solution 3 mL, 3 mL, Nebulization, TID, Vann, Jessica U, DO, 3 mL at 05/03/19 0744 .  lisinopril (ZESTRIL) tablet 40 mg, 40 mg, Oral, Daily, Karmen Bongo, MD, 40 mg at 05/03/19 0859 .  loratadine (CLARITIN) tablet 10 mg, 10 mg, Oral, Daily, Karmen Bongo, MD, 10 mg at 05/03/19 0857 .  lurasidone (LATUDA) tablet 40 mg, 40 mg, Oral, BID, Karmen Bongo, MD, 40 mg at 05/03/19 0858 .  Melatonin TABS 6 mg, 6 mg, Oral, QHS, Karmen Bongo, MD, 6 mg at 05/02/19 2247 .  methocarbamol (ROBAXIN) tablet 500 mg, 500 mg, Oral, Q8H PRN, Karmen Bongo, MD .  metoprolol tartrate (LOPRESSOR) tablet 25 mg, 25 mg, Oral, BID, Karmen Bongo, MD, 25 mg at 05/03/19 0854 .  NIFEdipine (PROCARDIA  XL/NIFEDICAL-XL) 24 hr tablet 90 mg, 90 mg, Oral, Daily, Karmen Bongo, MD, 90 mg at 05/03/19 0858 .  ondansetron (ZOFRAN) tablet 4 mg, 4 mg, Oral, Q6H PRN **OR** ondansetron (ZOFRAN) injection 4 mg, 4 mg, Intravenous, Q6H PRN, Karmen Bongo, MD .  oxyCODONE (Oxy IR/ROXICODONE) immediate release tablet 5 mg, 5 mg, Oral, Q12H PRN, Karmen Bongo, MD .  pantoprazole (PROTONIX) EC tablet 40 mg, 40 mg, Oral, QHS, Karmen Bongo, MD, 40 mg at 05/02/19 2246 .  polyethylene glycol (MIRALAX / GLYCOLAX) packet 17 g, 17 g, Oral, Daily, Karmen Bongo, MD, 17 g at 05/03/19 0900 .  sodium chloride tablet 1 g, 1 g, Oral, TID WC, Karmen Bongo, MD, 1 g at 05/03/19 0854 .  valproic acid (DEPAKENE) 250 MG capsule 500 mg, 500 mg, Oral, BID, Karmen Bongo, MD, 500 mg at 05/03/19 0858 Labs CBC    Component Value Date/Time   WBC 7.2 05/03/2019 0220   RBC 4.75 05/03/2019 0220   HGB 13.4 05/03/2019 0220   HGB 14.4 06/22/2012 0014   HCT 40.6 05/03/2019 0220   HCT 43.6 06/22/2012 0014   PLT 206 05/03/2019 0220   PLT 189 06/22/2012 0014   MCV 85.5 05/03/2019 0220   MCV 89 06/22/2012 0014   MCH 28.2 05/03/2019 0220   MCHC 33.0 05/03/2019 0220   RDW 13.9 05/03/2019 0220   RDW 17.4 (H) 06/22/2012 0014   LYMPHSABS 1.3 05/01/2019 1151   LYMPHSABS 0.6 (L) 06/01/2012 0458   MONOABS 0.6 05/01/2019 1151   MONOABS 0.2 06/01/2012 0458   EOSABS 0.0 05/01/2019 1151   EOSABS 0.0 06/01/2012 0458   BASOSABS 0.0 05/01/2019 1151   BASOSABS 0.0 06/01/2012 0458   CMP     Component Value Date/Time   NA 136 05/03/2019 0220   NA 128 (L) 06/23/2012 0536   K 3.4 (L) 05/03/2019 0220   K 4.3 06/23/2012 0536   CL 103 05/03/2019 0220   CL 92 (L) 06/23/2012 0536   CO2 24 05/03/2019 0220   CO2 28 06/23/2012 0536   GLUCOSE 107 (H) 05/03/2019 0220   GLUCOSE 237 (H) 06/23/2012 0536   BUN 23 (H) 05/03/2019 0220   BUN 4 (L) 06/23/2012 0536   CREATININE 0.55 05/03/2019 0220   CREATININE 0.42 (L) 06/23/2012 0536    CALCIUM 8.9 05/03/2019 0220   CALCIUM 9.1 06/23/2012 0536   CALCIUM 9.3 04/09/2011 1058   PROT 7.9 05/01/2019 1151   PROT 6.9 06/22/2012 0014   ALBUMIN 3.9 05/01/2019 1151   ALBUMIN 2.8 (L) 06/22/2012 0014   AST 19 05/01/2019 1151   AST 10 (L) 06/22/2012 0014   ALT 18 05/01/2019 1151   ALT 11 (L) 06/22/2012 0014   ALKPHOS 78 05/01/2019 1151   ALKPHOS 81 06/22/2012 0014   BILITOT 0.9 05/01/2019 1151   BILITOT 0.2 06/22/2012 0014   GFRNONAA >60 05/03/2019 0220   GFRNONAA >60 06/23/2012 0536   GFRAA >60 05/03/2019 0220   GFRAA >60  06/23/2012 0536   Imaging I have reviewed images in epic and the results pertinent to this consultation are: CT head unremarkable for acute process.  No bleed CTA head and neck with no emergent LVO.  Multiple intracranial stenoses and intermittent atherosclerotic disease with no emergent LVO. MRI examination of the brain with no stroke EEG with continuous slowing.  Significant myogenic and chewing artifact.  Assessment: 58 year old history of anxiety, depression, schizoaffective disorder bipolar type, CHF, diabetes, hypertension, hyperlipidemia, chronic hyponatremia tobacco abuse resident of a nursing facility memory care center, brought in for concern for speech difficulty. Systolic blood pressures are high-in the 200s. Pressures have been improving since admission. Exam, with the exception of aphasia is nonfocal. MRI, CT and CTA are negative for acute process Likely differentials include: Hypertensive urgency plus minus neuro changes related to her psychiatric illness. Social worker responsible for her care and her legal guardian says she has history of behavioral issues for attention seeking.  Recommendations: Continue blood pressure management as recommended before. No need for repeat EEG.  I do not see need for further neuroimaging Consider psychiatry consultation.  Specifically question on Latuda causing cognitive side effects such as dementia and  behavioral disturbance.  Neurology will be available as needed.  -- Amie Portland, MD [facility] Pager: [phone] If 7pm to 7am, please call on call as listed on AMION.

## 2019-05-03 NOTE — Consult Note (Addendum)
I attempted to assist the patient today but she is unable to answer any questions.  Patient only states "I do not know what to say."  Neurology had consulted on this patient and there is concern for Latuda causing some cognitive deficits.  After consulting with Dr. Parke Poisson feel that if there is concern for this medication then he can be discontinued and the Saphris 10 mg p.o. twice daily can be restarted which is what the patient was on per the chart review when she discharged from the hospital last.  I have sent a message to Dr. Eliseo Squires notifying her of the recommendations.  Patient will remain on consult list to be followed for tomorrow  Attest to NP note

## 2019-05-04 DIAGNOSIS — F250 Schizoaffective disorder, bipolar type: Secondary | ICD-10-CM

## 2019-05-04 DIAGNOSIS — F411 Generalized anxiety disorder: Secondary | ICD-10-CM

## 2019-05-04 LAB — BASIC METABOLIC PANEL
Anion gap: 12 (ref 5–15)
BUN: 16 mg/dL (ref 6–20)
CO2: 22 mmol/L (ref 22–32)
Calcium: 9.1 mg/dL (ref 8.9–10.3)
Chloride: 102 mmol/L (ref 98–111)
Creatinine, Ser: 0.48 mg/dL (ref 0.44–1.00)
GFR calc Af Amer: >60 mL/min (ref >60)
GFR calc non Af Amer: >60 mL/min (ref >60)
Glucose, Bld: 113 mg/dL — ABNORMAL HIGH (ref 70–99)
Potassium: 3.6 mmol/L (ref 3.5–5.1)
Sodium: 136 mmol/L (ref 135–145)

## 2019-05-04 LAB — GLUCOSE, CAPILLARY
Glucose-Capillary: 118 mg/dL — ABNORMAL HIGH (ref 70–99)
Glucose-Capillary: 94 mg/dL (ref 70–99)
Glucose-Capillary: 103 mg/dL — ABNORMAL HIGH (ref 70–99)
Glucose-Capillary: 86 mg/dL (ref 70–99)

## 2019-05-04 LAB — MAGNESIUM: Magnesium: 1.6 mg/dL — ABNORMAL LOW (ref 1.7–2.4)

## 2019-05-04 MED ORDER — OLANZAPINE 2.5 MG PO TABS
5.0000 mg | ORAL_TABLET | Freq: Two times a day (BID) | ORAL | Status: DC
  Administered 2019-05-04 – 2019-05-15 (×23): 5 mg via ORAL
  Filled 2019-05-04 (×23): qty 2

## 2019-05-04 MED ORDER — MAGNESIUM SULFATE 4 GM/100ML IV SOLN
4.0000 g | Freq: Once | INTRAVENOUS | Status: AC
  Administered 2019-05-04: 4 g via INTRAVENOUS
  Filled 2019-05-04: qty 100

## 2019-05-04 MED ORDER — CLONAZEPAM 0.5 MG PO TABS
0.5000 mg | ORAL_TABLET | Freq: Two times a day (BID) | ORAL | Status: DC
  Administered 2019-05-04 – 2019-05-06 (×5): 0.5 mg via ORAL
  Filled 2019-05-04 (×5): qty 1

## 2019-05-04 MED ORDER — CITALOPRAM HYDROBROMIDE 10 MG PO TABS
20.0000 mg | ORAL_TABLET | Freq: Every day | ORAL | Status: DC
  Administered 2019-05-04 – 2019-05-14 (×11): 20 mg via ORAL
  Filled 2019-05-04 (×12): qty 2

## 2019-05-04 MED ORDER — ALPRAZOLAM 0.5 MG PO TABS
0.5000 mg | ORAL_TABLET | Freq: Three times a day (TID) | ORAL | Status: DC
  Administered 2019-05-04: 08:00:00 0.5 mg via ORAL
  Filled 2019-05-04: qty 1

## 2019-05-04 MED ORDER — GABAPENTIN 100 MG PO CAPS
100.0000 mg | ORAL_CAPSULE | Freq: Two times a day (BID) | ORAL | Status: DC
  Administered 2019-05-04 – 2019-05-15 (×22): 100 mg via ORAL
  Filled 2019-05-04 (×22): qty 1

## 2019-05-04 MED ORDER — LURASIDONE HCL 20 MG PO TABS
20.0000 mg | ORAL_TABLET | Freq: Two times a day (BID) | ORAL | Status: DC
  Administered 2019-05-04 – 2019-05-15 (×22): 20 mg via ORAL
  Filled 2019-05-04 (×24): qty 1

## 2019-05-04 NOTE — Progress Notes (Signed)
PROGRESS NOTE  Natalie Thomas [id number] DOB: March 22, 1961 DOA: 05/01/2019 PCP: System, Pcp Not In  HPI/Recap of past 24 hours: Natalie Thomas is an 58 y.o. female with medical history significant ofbipolar/schizoaffective d/o; HTN; HLD: DM; COPD; chronic hyponatremia; and CHF presenting with difficulty talking. At baseline at the time of last discharge, she was calm and comfortable, "pleasantly confused, nonsensical speech."   05/04/19: Patient was seen and examined at her bedside this morning.  She is very anxious this morning. Pulling at her pure wick which was removed.  Seen by psych with recommendations which have been applied.  We will start continuous pulse oximetry and closely monitor on new psych medications.  Due to her confusion and high risk for falls and self injury, will obtain 1 to 1.  Assessment/Plan: Principal Problem:   Hypertensive crisis Active Problems:   DM (diabetes mellitus), type 2 (HCC)   COPD (chronic obstructive pulmonary disease) (HCC)   Schizoaffective disorder, bipolar type (HCC)   Hyperlipidemia   Chronic diastolic CHF (congestive heart failure) ([place])   Hypertensive crisis with Abnormal EKG -Patient presenting withAMS and speech dysfunction that may be different from baseline -Initially called code stroke but negative MRI and clinical picture is less consistent with this  Blood pressure is improving Continue lisinopril 40 mg daily, Lopressor p.o. 25 mg twice daily, p.o. nifedipine 90 mg daily Continue IV hydralazine as needed with parameters  Schizoaffective disorder/bipolar type Seen by psych with recommendations are stated below Schizoaffective disorder, bipolar type: -Recommend tapering off of Latuda from 40 mg BID to 20 mg BID -Start Zyprexa 5 mg BID -Discontinue Wellbutrin 150 mg daily (increases anxiety) -Start Celexa 20 mg daily at bedtime, assists with sleep also  Anxiety: -Recommend increasing gabapentin 100 mg daily to  BID -Recommend discontinuing Xanax 0.5 mg TID -Recommend Klonopin 0.5 mg BID (longer acting) Psych recommendations have been applied. Closely monitor on new medications  Acute metabolic encephalopathy in the setting of mental illness Management as above EEG no seizure seen. One-to-one on 05/04/2019 for patient's own safety Fall precautions  COPD Continue Combivent and Albuterol -Continue Breo Ellipta -Appears to be compensated at this time  HLD -She does not appear to be taking medications for this issue at this time  follow-up with PCP  Chronic diastolic CHF -Appears to be compensated -12/04/18 echo with preserved EF and "impaired" relaxation  Obesity Body mass index is 31.24 kg/m Recommend weight loss outpatient with regular physical activity activity and healthy dieting.   Family Communication/Anticipated D/C date and plan/Code Status   DVT prophylaxis: Lovenox subcu daily Code Status: Full Code.  Family Communication:  Disposition Plan:  Possible DC to home in 24 to 48 hours once her mentation is close to baseline.   Medical Consultants:    Neuro  psych      Objective: Vitals:   05/04/19 0402 05/04/19 0805 05/04/19 0819 05/04/19 1118  BP: (!) 177/83  (!) 127/55 (!) 166/77  Pulse: 67  90 72  Resp:   20 20  Temp: 98.2 F (36.8 C)  98.4 F (36.9 C) 97.9 F (36.6 C)  TempSrc: Oral  Oral Oral  SpO2: 95% 95% 97% 97%  Weight:      Height:        Intake/Output Summary (Last 24 hours) at 05/04/2019 1151 Last data filed at 05/04/2019 0355 Gross per 24 hour  Intake -  Output 1000 ml  Net -1000 ml   Filed Weights   05/01/19 1100 05/01/19 1437  Weight: 80 kg 80 kg    Exam:  . General: 58 y.o. year-old female well developed well nourished in no acute distress.  Alert and confused. . Cardiovascular: Regular rate and rhythm with no rubs or gallops.  No thyromegaly or JVD noted.   Marland Kitchen Respiratory: Clear to auscultation with no wheezes or  rales. Good inspiratory effort. . Abdomen: Soft nontender nondistended with normal bowel sounds x4 quadrants. . Musculoskeletal: No lower extremity edema. 2/4 pulses in all 4 extremities. Marland Kitchen Psychiatry: Mood is appropriate for condition and setting   Data Reviewed: CBC: Recent Labs  Lab 05/01/19 1133 05/01/19 1151 05/02/19 0311 05/03/19 0220  WBC  --  8.5 6.8 7.2  NEUTROABS  --  6.5  --   --   HGB 17.0* 15.9* 13.9 13.4  HCT 50.0* 47.7* 42.0 40.6  MCV  --  85.5 84.8 85.5  PLT  --  205 206 [id number]   Basic Metabolic Panel: Recent Labs  Lab 05/01/19 1133 05/01/19 1151 05/02/19 0311 05/03/19 0220 05/04/19 0650  NA 135 133* 134* 136 136  K 4.3 3.8 3.4* 3.4* 3.6  CL 102 97* 100 103 102  CO2  --  21* 23 24 22   GLUCOSE 105* 109* 106* 107* 113*  BUN 24* 16 20 23* 16  CREATININE 0.50 0.58 0.62 0.55 0.48  CALCIUM  --  9.9 9.1 8.9 9.1  MG  --   --   --   --  1.6*   GFR: Estimated Creatinine Clearance: 77.7 mL/min (by C-G formula based on SCr of 0.48 mg/dL). Liver Function Tests: Recent Labs  Lab 05/01/19 1151  AST 19  ALT 18  ALKPHOS 78  BILITOT 0.9  PROT 7.9  ALBUMIN 3.9   No results for input(s): LIPASE, AMYLASE in the last 168 hours. No results for input(s): AMMONIA in the last 168 hours. Coagulation Profile: Recent Labs  Lab 05/01/19 1147  INR 1.0   Cardiac Enzymes: No results for input(s): CKTOTAL, CKMB, CKMBINDEX, TROPONINI in the last 168 hours. BNP (last 3 results) No results for input(s): PROBNP in the last 8760 hours. HbA1C: No results for input(s): HGBA1C in the last 72 hours. CBG: Recent Labs  Lab 05/03/19 1138 05/03/19 1638 05/03/19 2122 05/04/19 0557 05/04/19 1115  GLUCAP 109* 96 92 118* 94   Lipid Profile: No results for input(s): CHOL, HDL, LDLCALC, TRIG, CHOLHDL, LDLDIRECT in the last 72 hours. Thyroid Function Tests: No results for input(s): TSH, T4TOTAL, FREET4, T3FREE, THYROIDAB in the last 72 hours. Anemia Panel: No results for  input(s): VITAMINB12, FOLATE, FERRITIN, TIBC, IRON, RETICCTPCT in the last 72 hours. Urine analysis:    Component Value Date/Time   COLORURINE YELLOW 05/02/2019 1828   APPEARANCEUR CLEAR 05/02/2019 1828   APPEARANCEUR Clear 06/22/2012 0618   LABSPEC 1.033 (H) 05/02/2019 1828   LABSPEC 1.003 06/22/2012 0618   PHURINE 6.0 05/02/2019 1828   GLUCOSEU NEGATIVE 05/02/2019 1828   GLUCOSEU Negative 06/22/2012 0618   GLUCOSEU NEGATIVE 04/09/2011 1058   HGBUR NEGATIVE 05/02/2019 1828   BILIRUBINUR NEGATIVE 05/02/2019 1828   BILIRUBINUR Negative 06/22/2012 0618   KETONESUR 20 (A) 05/02/2019 1828   PROTEINUR NEGATIVE 05/02/2019 1828   UROBILINOGEN 1.0 08/18/2011 1805   NITRITE NEGATIVE 05/02/2019 1828   LEUKOCYTESUR NEGATIVE 05/02/2019 1828   LEUKOCYTESUR Trace 06/22/2012 0618   Sepsis Labs: @LABRCNTIP (procalcitonin:4,lacticidven:4)  ) Recent Results (from the past 240 hour(s))  SARS CORONAVIRUS 2 (TAT 6-24 HRS) Nasopharyngeal Nasopharyngeal Swab     Status: None   Collection Time:  05/01/19  1:47 PM   Specimen: Nasopharyngeal Swab  Result Value Ref Range Status   SARS Coronavirus 2 NEGATIVE NEGATIVE Final    Comment: (NOTE) SARS-CoV-2 target nucleic acids are NOT DETECTED. The SARS-CoV-2 RNA is generally detectable in upper and lower respiratory specimens during the acute phase of infection. Negative results do not preclude SARS-CoV-2 infection, do not rule out co-infections with other pathogens, and should not be used as the sole basis for treatment or other patient management decisions. Negative results must be combined with clinical observations, patient history, and epidemiological information. The expected result is Negative. Fact Sheet for Patients: SugarRoll.be Fact Sheet for Healthcare Providers: https://www.woods-mathews.com/ This test is not yet approved or cleared by the [place] FDA and  has been authorized for detection  and/or diagnosis of SARS-CoV-2 by FDA under an Emergency Use Authorization (EUA). This EUA will remain  in effect (meaning this test can be used) for the duration of the COVID-19 declaration under Section 56 4(b)(1) of the Act, 21 U.S.C. section 360bbb-3(b)(1), unless the authorization is terminated or revoked sooner. Performed at [facility], [place] [address], [place], [place] [address]       Studies: No results found.  Scheduled Meds: . aspirin EC  81 mg Oral Daily  . citalopram  20 mg Oral QHS  . clonazePAM  0.5 mg Oral BID  . docusate sodium  100 mg Oral BID  . enoxaparin (LOVENOX) injection  40 mg Subcutaneous Q24H  . feeding supplement (ENSURE ENLIVE)  237 mL Oral Q24H  . feeding supplement (PRO-STAT SUGAR FREE 64)  30 mL Oral BID  . fluticasone  2 spray Each Nare BID  . fluticasone furoate-vilanterol  1 puff Inhalation Daily  . gabapentin  100 mg Oral BID  . guaiFENesin  1,200 mg Oral Daily  . ipratropium-albuterol  3 mL Nebulization TID  . lisinopril  40 mg Oral Daily  . loratadine  10 mg Oral Daily  . lurasidone  20 mg Oral BID  . Melatonin  6 mg Oral QHS  . metoprolol tartrate  25 mg Oral BID  . NIFEdipine  90 mg Oral Daily  . OLANZapine  5 mg Oral BID  . pantoprazole  40 mg Oral QHS  . polyethylene glycol  17 g Oral Daily  . sodium chloride  1 g Oral TID WC  . valproic acid  500 mg Oral BID    Continuous Infusions:   LOS: 2 days     Kayleen Memos, MD [facility] Pager [phone]  If 7PM-7AM, please contact night-coverage [email or web address] Password [email or web address] 05/04/2019, 11:51 AM

## 2019-05-04 NOTE — Consult Note (Signed)
[facility] Face-to-Face Psychiatry Consult   Reason for Consult:  Medication (psychiatric) and symptom evaluation Referring Physician:  Dr Eliseo Squires Patient Identification: JAILEEN MALKIEWICZ MRN:  [id number] Principal Diagnosis: Hypertensive crisis Diagnosis:  Principal Problem:   Hypertensive crisis Active Problems:   Schizoaffective disorder, bipolar type ([place])   DM (diabetes mellitus), type 2 ([place])   COPD (chronic obstructive pulmonary disease) (HCC)   Hyperlipidemia   Chronic diastolic CHF (congestive heart failure) ([place])   Total Time spent with patient: 1 hour  Subjective:   CAROLANNE FRANCISCO is a 58 y.o. female patient admitted with HTN crisis.  "My stomach doesn't feel good."  Later in the conversation, "there is a beeping in my ear."  Patient seen and evaluated in person by this provider.  Admitted with hypertensive crisis and hyponatremia.  She has difficult to understand on assessment with minimal coherent speech, mumbles and slurs, minimal teeth exacerbate the issue.  Anxious on assessment and pulling at her vagina area even though the nurse in the room reports that she removed her urinary wick.  Her anxiety was worse earlier with MD increasing her Xanax to 3 times daily from twice daily.  Grunts and moans frequently during assessment reports her sleep is not good and says there is a "beeping in my ear".  Does not communicate how long this has been going on or other concerns.  HPI per MD on admission:  NEHA WALDRIDGE is a 58 y.o. female with medical history significant of bipolar/schizoaffective d/o;  HTN; HLD: DM; COPD; chronic hyponatremia; and CHF presenting with difficulty talking.  At baseline at the time of last discharge, she was calm and comfortable, "pleasantly confused, nonsensical speech."  She is currently alert and does not answer questions, but she heard someone yelling down the hall and repeatedly asked what was going on.  She was last admitted from 10/27-30 with  malignant hypertensive urgency.  She was started on a labetalol drip and increased her lisinopril.  Lasix dose was decreased due to chronic hyponatremia.  Past Psychiatric History:   Schizoaffective disorder  Risk to Self:  none Risk to Others:  none Prior Inpatient Therapy:  unknown Prior Outpatient Therapy:  yes  Past Medical History:  Past Medical History:  Diagnosis Date  . Anemia 10/16/2017  . Anxiety and depression   . CHF (congestive heart failure) ([place])   . Chronic hyponatremia 04/08/2007   Qualifier: Diagnosis of  By: Marca Ancona RMA, Lucy    . Closed comminuted intertrochanteric fracture of right femur ([place]) 11/07/2017  . COPD 04/08/2007   Qualifier: Diagnosis of  By: Marca Ancona RMA, Lucy    . Depression   . Diabetes mellitus, type 2 ([place])    pt denies but states that she has been treated for DM  . Essential hypertension 04/08/2007   Qualifier: Diagnosis of  By: Marca Ancona RMA, Lucy    . GERD (gastroesophageal reflux disease)   . Hyperlipidemia 10/07/2017  . Lumbar disc disease 04/09/2011  . MENOPAUSE, PREMATURE 04/08/2007   Qualifier: Diagnosis of  By: Marca Ancona RMA, Lucy    . 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 Osteoporosis 04/08/2007   Qualifier: Diagnosis of  By: Reatha Armour, Lucy    . Polycythemia secondary to smoking 04/09/2011  . Schizoaffective disorder, bipolar type ([place]) 07/05/2011    Past Surgical History:  Procedure Laterality Date  . EYE SURGERY     on left  eye, pt states that she  sees bad out of the right eye and needs surgery there  . INTRAMEDULLARY (IM) NAIL INTERTROCHANTERIC Right 11/13/2017   Procedure: INTRAMEDULLARY (IM) NAIL INTERTROCHANTRIC;  Surgeon: Shona Needles, MD;  Location: [facility];  Service: Orthopedics;  Laterality: Right;  . PELVIC FRACTURE SURGERY     Family History: No family history on file. Family Psychiatric  History: unknown Social History:  Social History   Substance and Sexual Activity   Alcohol Use No     Social History   Substance and Sexual Activity  Drug Use No    Social History   Socioeconomic History  . Marital status: Divorced    Spouse name: Not on file  . Number of children: Not on file  . Years of education: Not on file  . Highest education level: Not on file  Occupational History  . Occupation: Disabled  Social Needs  . Financial resource strain: Not on file  . Food insecurity    Worry: Not on file    Inability: Not on file  . Transportation needs    Medical: Not on file    Non-medical: Not on file  Tobacco Use  . Smoking status: Current Every Day Smoker    Packs/day: 0.25    Types: Cigarettes  . Smokeless tobacco: Never Used  Substance and Sexual Activity  . Alcohol use: No  . Drug use: No  . Sexual activity: Never  Lifestyle  . Physical activity    Days per week: Not on file    Minutes per session: Not on file  . Stress: Not on file  Relationships  . Social Herbalist on phone: Not on file    Gets together: Not on file    Attends religious service: Not on file    Active member of club or organization: Not on file    Attends meetings of clubs or organizations: Not on file    Relationship status: Not on file  Other Topics Concern  . Not on file  Social History Narrative   single   Additional Social History:    Allergies:   Allergies  Allergen Reactions  . Clarithromycin Itching  . Penicillins Itching    Has tolerated cefazolin before  Has patient had a PCN reaction causing immediate rash, facial/tongue/throat swelling, SOB or lightheadedness with hypotension: No Has patient had a PCN reaction causing severe rash involving mucus membranes or skin necrosis: No Has patient had a PCN reaction that required hospitalization: Unknown Has patient had a PCN reaction occurring within the last 10 years: No If all of the above answers are "NO", then may proceed with Cephalosporin use.     Labs:  Results for orders  placed or performed during the hospital encounter of 05/01/19 (from the past 48 hour(s))  Valproic acid level     Status: None   Collection Time: 05/02/19 10:04 AM  Result Value Ref Range   Valproic Acid Lvl 53 50.0 - 100.0 ug/mL    Comment: Performed at [facility], [address], [place],  [address]  Glucose, capillary     Status: Abnormal   Collection Time: 05/02/19 11:37 AM  Result Value Ref Range   Glucose-Capillary 138 (H) 70 - 99 mg/dL   Comment 1 Notify RN    Comment 2 Document in Chart   Urinalysis, Routine w reflex microscopic     Status: Abnormal   Collection Time: 05/02/19  6:28 PM  Result Value Ref Range   Color, Urine  YELLOW YELLOW   APPearance CLEAR CLEAR   Specific Gravity, Urine 1.033 (H) 1.005 - 1.030   pH 6.0 5.0 - 8.0   Glucose, UA NEGATIVE NEGATIVE mg/dL   Hgb urine dipstick NEGATIVE NEGATIVE   Bilirubin Urine NEGATIVE NEGATIVE   Ketones, ur 20 (A) NEGATIVE mg/dL   Protein, ur NEGATIVE NEGATIVE mg/dL   Nitrite NEGATIVE NEGATIVE   Leukocytes,Ua NEGATIVE NEGATIVE   RBC / HPF 0-5 0 - 5 RBC/hpf   WBC, UA 0-5 0 - 5 WBC/hpf   Bacteria, UA NONE SEEN NONE SEEN   Mucus PRESENT    Hyaline Casts, UA PRESENT     Comment: Performed at [facility] [address] [place], [place] [address]  Glucose, capillary     Status: Abnormal   Collection Time: 05/02/19  9:20 PM  Result Value Ref Range   Glucose-Capillary 160 (H) 70 - 99 mg/dL   Comment 1 Notify RN    Comment 2 Document in Chart   CBC     Status: None   Collection Time: 05/03/19  2:20 AM  Result Value Ref Range   WBC 7.2 4.0 - 10.5 K/uL   RBC 4.75 3.87 - 5.11 MIL/uL   Hemoglobin 13.4 12.0 - 15.0 g/dL   HCT 40.6 36.0 - 46.0 %   MCV 85.5 80.0 - 100.0 fL   MCH 28.2 26.0 - 34.0 pg   MCHC 33.0 30.0 - 36.0 g/dL   RDW 13.9 11.5 - 15.5 %   Platelets 206 150 - 400 K/uL   nRBC 0.0 0.0 - 0.2 %    Comment: Performed at [facility], [place] [address], [place], [place] [id number]  Basic  metabolic panel     Status: Abnormal   Collection Time: 05/03/19  2:20 AM  Result Value Ref Range   Sodium 136 135 - 145 mmol/L   Potassium 3.4 (L) 3.5 - 5.1 mmol/L   Chloride 103 98 - 111 mmol/L   CO2 24 22 - 32 mmol/L   Glucose, Bld 107 (H) 70 - 99 mg/dL   BUN 23 (H) 6 - 20 mg/dL   Creatinine, Ser 0.55 0.44 - 1.00 mg/dL   Calcium 8.9 8.9 - 10.3 mg/dL   GFR calc non Af Amer >60 >60 mL/min   GFR calc Af Amer >60 >60 mL/min   Anion gap 9 5 - 15    Comment: Performed at [facility], [place] [address] [place], [place] [address]  Glucose, capillary     Status: Abnormal   Collection Time: 05/03/19  6:08 AM  Result Value Ref Range   Glucose-Capillary 107 (H) 70 - 99 mg/dL   Comment 1 Notify RN    Comment 2 Document in Chart   Glucose, capillary     Status: Abnormal   Collection Time: 05/03/19 11:38 AM  Result Value Ref Range   Glucose-Capillary 109 (H) 70 - 99 mg/dL   Comment 1 Notify RN    Comment 2 Document in Chart   Glucose, capillary     Status: None   Collection Time: 05/03/19  4:38 PM  Result Value Ref Range   Glucose-Capillary 96 70 - 99 mg/dL   Comment 1 Notify RN    Comment 2 Document in Chart   Glucose, capillary     Status: None   Collection Time: 05/03/19  9:22 PM  Result Value Ref Range   Glucose-Capillary 92 70 - 99 mg/dL  Glucose, capillary     Status:  Abnormal   Collection Time: 05/04/19  5:57 AM  Result Value Ref Range   Glucose-Capillary 118 (H) 70 - 99 mg/dL  Basic metabolic panel     Status: Abnormal   Collection Time: 05/04/19  6:50 AM  Result Value Ref Range   Sodium 136 135 - 145 mmol/L   Potassium 3.6 3.5 - 5.1 mmol/L   Chloride 102 98 - 111 mmol/L   CO2 22 22 - 32 mmol/L   Glucose, Bld 113 (H) 70 - 99 mg/dL   BUN 16 6 - 20 mg/dL   Creatinine, Ser 0.48 0.44 - 1.00 mg/dL   Calcium 9.1 8.9 - 10.3 mg/dL   GFR calc non Af Amer >60 >60 mL/min   GFR calc Af Amer >60 >60 mL/min   Anion gap 12 5 - 15    Comment: Performed at [facility], [place] [address] [place], [place] [address]  Magnesium     Status: Abnormal   Collection Time: 05/04/19  6:50 AM  Result Value Ref Range   Magnesium 1.6 (L) 1.7 - 2.4 mg/dL    Comment: Performed at [facility] [address] [place], [place] [address]    Current Facility-Administered Medications  Medication Dose Route Frequency Provider Last Rate Last Dose  . acetaminophen (TYLENOL) tablet 650 mg  650 mg Oral Q6H PRN Karmen Bongo, MD       Or  . acetaminophen (TYLENOL) suppository 650 mg  650 mg Rectal Q6H PRN Karmen Bongo, MD      . albuterol (PROVENTIL) (2.5 MG/3ML) 0.083% nebulizer solution 3 mL  3 mL Inhalation Q4H PRN Karmen Bongo, MD      . ALPRAZolam Duanne Moron) tablet 0.5 mg  0.5 mg Oral TID Irene Pap N, DO   0.5 mg at 05/04/19 0803  . aspirin EC tablet 81 mg  81 mg Oral Daily Karmen Bongo, MD   81 mg at 05/03/19 0856  . buPROPion (WELLBUTRIN XL) 24 hr tablet 150 mg  150 mg Oral Daily Karmen Bongo, MD   150 mg at 05/03/19 0858  . docusate sodium (COLACE) capsule 100 mg  100 mg Oral BID Karmen Bongo, MD   100 mg at 05/03/19 2142  . enoxaparin (LOVENOX) injection 40 mg  40 mg Subcutaneous Q24H Karmen Bongo, MD   40 mg at 05/03/19 2143  . feeding supplement (ENSURE ENLIVE) (ENSURE ENLIVE) liquid 237 mL  237 mL Oral Q24H Karmen Bongo, MD      . feeding supplement (PRO-STAT SUGAR FREE 64) liquid 30 mL  30 mL Oral BID Karmen Bongo, MD   30 mL at 05/03/19 2142  . fluticasone (FLONASE) 50 MCG/ACT nasal spray 2 spray  2 spray Each Nare BID Karmen Bongo, MD   2 spray at 05/03/19 2148  . fluticasone furoate-vilanterol (BREO ELLIPTA) 100-25 MCG/INH 1 puff  1 puff Inhalation Daily Karmen Bongo, MD   1 puff at 05/04/19 0807  . gabapentin (NEURONTIN) capsule 100 mg  100 mg Oral Daily Karmen Bongo, MD   100 mg at 05/03/19 0859  . guaiFENesin (MUCINEX) 12 hr tablet 1,200 mg  1,200 mg Oral Daily Karmen Bongo, MD   1,200 mg at 05/03/19 0857  . guaifenesin  (ROBITUSSIN) 100 MG/5ML syrup 200 mg  200 mg Oral TID PRN Karmen Bongo, MD      . hydrALAZINE (APRESOLINE) injection 5 mg  5 mg Intravenous Q4H PRN Karmen Bongo, MD   5 mg at 05/02/19 0517  . hydrOXYzine (ATARAX/VISTARIL) tablet  50 mg  50 mg Oral TID Karmen Bongo, MD   50 mg at 05/03/19 2143  . ipratropium-albuterol (DUONEB) 0.5-2.5 (3) MG/3ML nebulizer solution 3 mL  3 mL Nebulization TID Eulogio Bear U, DO   3 mL at 05/04/19 0805  . lisinopril (ZESTRIL) tablet 40 mg  40 mg Oral Daily Karmen Bongo, MD   40 mg at 05/03/19 0859  . loratadine (CLARITIN) tablet 10 mg  10 mg Oral Daily Karmen Bongo, MD   10 mg at 05/03/19 0857  . lurasidone (LATUDA) tablet 40 mg  40 mg Oral BID Karmen Bongo, MD   40 mg at 05/03/19 2143  . Melatonin TABS 6 mg  6 mg Oral QHS Karmen Bongo, MD   6 mg at 05/03/19 2143  . methocarbamol (ROBAXIN) tablet 500 mg  500 mg Oral Q8H PRN Karmen Bongo, MD      . metoprolol tartrate (LOPRESSOR) tablet 25 mg  25 mg Oral BID Karmen Bongo, MD   25 mg at 05/03/19 2142  . NIFEdipine (PROCARDIA XL/NIFEDICAL-XL) 24 hr tablet 90 mg  90 mg Oral Daily Karmen Bongo, MD   90 mg at 05/03/19 0858  . ondansetron (ZOFRAN) tablet 4 mg  4 mg Oral Q6H PRN Karmen Bongo, MD       Or  . ondansetron [facility]) injection 4 mg  4 mg Intravenous Q6H PRN Karmen Bongo, MD   4 mg at 05/04/19 0748  . oxyCODONE (Oxy IR/ROXICODONE) immediate release tablet 5 mg  5 mg Oral Q12H PRN Karmen Bongo, MD   5 mg at 05/03/19 1835  . pantoprazole (PROTONIX) EC tablet 40 mg  40 mg Oral Ivery Quale, MD   40 mg at 05/03/19 2142  . polyethylene glycol (MIRALAX / GLYCOLAX) packet 17 g  17 g Oral Daily Karmen Bongo, MD   17 g at 05/03/19 0900  . sodium chloride tablet 1 g  1 g Oral TID WC Karmen Bongo, MD   1 g at 05/03/19 1633  . valproic acid (DEPAKENE) 250 MG capsule 500 mg  500 mg Oral BID Karmen Bongo, MD   500 mg at 05/03/19 2142    Musculoskeletal: Strength & Muscle  Tone: decreased Gait & Station: did not witness Patient leans: N/A  Psychiatric Specialty Exam: Physical Exam  Nursing note and vitals reviewed. Constitutional: She appears well-developed and well-nourished.  HENT:  Head: Normocephalic.  Neck: Normal range of motion.  Respiratory: Effort normal.  Neurological: She is alert.  Psychiatric: Her speech is normal. Judgment and thought content normal. Her mood appears anxious. Her affect is blunt. She is agitated. Cognition and memory are impaired. She exhibits a depressed mood.    Review of Systems  Gastrointestinal: Positive for nausea.  Neurological: Positive for weakness.  Psychiatric/Behavioral: Positive for depression and memory loss. The patient is nervous/anxious.   All other systems reviewed and are negative.   Blood pressure (!) 127/55, pulse 90, temperature 98.4 F (36.9 C), temperature source Oral, resp. rate 20, height 5\' 3"  (1.6 m), weight 80 kg, SpO2 97 %.Body mass index is 31.24 kg/m.  General Appearance: Disheveled  Eye Contact:  Fair  Speech:  Slurred and minimal teeth  Volume:  Decreased  Mood:  Anxious and Depressed  Affect:  Blunt  Thought Process:  Disorganized  Orientation:  Other:  person  Thought Content:  unable to assess, difficult to understand and minimal verbal dialogue  Suicidal Thoughts:  No  Homicidal Thoughts:  No  Memory:  Immediate;   Poor  Recent;   Fair Remote;   Poor  Judgement:  Impaired  Insight:  Lacking  Psychomotor Activity:  Normal  Concentration:  Concentration: Fair and Attention Span: Fair  Recall:  Poor  Fund of Knowledge:  Fair  Language:  Poor  Akathisia:  No  Handed:  Right  AIMS (if indicated):     Assets:  Housing Resilience  ADL's:  Impaired  Cognition:  Impaired,  Moderate  Sleep:      58 year old female admitted for hypertensive crisis and intelligible speech, long history of schizoaffective bipolar type.  Resides in a SNF.  Speech remains difficult to understand  and minimal history obtained.  She is anxious on assessment with complaints of beeping in one of her ears along with stomach pain.  Shakes her head no to hearing voices and seeing things along with suicidal/homicidal ideations.  Consult placed for evaluation of medications possibly contributing to confusion and behavior changes.  Treatment Plan Summary: Schizoaffective disorder, bipolar type: -Recommend tapering off of Latuda from 40 mg BID to 20 mg BID -Start Zyprexa 5 mg BID -Discontinue Wellbutrin 150 mg daily (increases anxiety) -Start Celexa 20 mg daily at bedtime, assists with sleep also  Anxiety: -Recommend increasing gabapentin 100 mg daily to BID -Recommend discontinuing Xanax 0.5 mg TID -Recommend Klonopin 0.5 mg BID (longer acting)  Disposition: No evidence of imminent risk to self or others at present.   Patient does not meet criteria for psychiatric inpatient admission.  Waylan Boga, NP 05/04/2019 9:28 AM

## 2019-05-05 LAB — GLUCOSE, CAPILLARY
Glucose-Capillary: 126 mg/dL — ABNORMAL HIGH (ref 70–99)
Glucose-Capillary: 99 mg/dL (ref 70–99)

## 2019-05-05 MED ORDER — IPRATROPIUM-ALBUTEROL 0.5-2.5 (3) MG/3ML IN SOLN
3.0000 mL | Freq: Two times a day (BID) | RESPIRATORY_TRACT | Status: DC
  Administered 2019-05-05 – 2019-05-06 (×2): 3 mL via RESPIRATORY_TRACT
  Filled 2019-05-05 (×2): qty 3

## 2019-05-05 NOTE — Progress Notes (Signed)
PROGRESS NOTE  HYLDA HUGO [id number] DOB: 10-07-1960 DOA: 05/01/2019 PCP: System, Pcp Not In  HPI/Recap of past 24 hours: LESHIA ROG is an 58 y.o. female with medical history significant ofbipolar/schizoaffective d/o; HTN; HLD: DM; COPD; chronic hyponatremia; and CHF presenting with difficulty talking. At baseline at the time of last discharge, she was calm and comfortable, "pleasantly confused, nonsensical speech."   05/04/19: Patient was seen and examined at her bedside this morning.  She is very anxious this morning. Pulling at her pure wick which was removed.  Seen by psych with recommendations which have been applied.  We will start continuous pulse oximetry and closely monitor on new psych medications.  Due to her confusion and high risk for falls and self injury, will obtain 1 to 1.  05/05/19: Patient was seen and examined at bedside this morning.  Per night RN she was able to sleep with improvement of her behavior on current psych medications.  No new complaints.  Assessment/Plan: Principal Problem:   Hypertensive crisis Active Problems:   DM (diabetes mellitus), type 2 (HCC)   COPD (chronic obstructive pulmonary disease) (HCC)   Schizoaffective disorder, bipolar type (HCC)   Hyperlipidemia   Chronic diastolic CHF (congestive heart failure) ([place])   Resolved hypertensive crisis with Abnormal EKG -Patient presenting withAMS and speech dysfunction that may be different from baseline -Initially called code stroke but negative MRI and clinical picture is less consistent with this  Blood pressure is currently at goal. Continue lisinopril 40 mg daily, Lopressor p.o. 25 mg twice daily, p.o. nifedipine 90 mg daily Continue IV hydralazine as needed with parameters  Schizoaffective disorder/bipolar type Seen by psych with recommendations are stated below Schizoaffective disorder, bipolar type: -Recommend tapering off of Latuda from 40 mg BID to 20 mg BID  -Start Zyprexa 5 mg BID -Discontinue Wellbutrin 150 mg daily (increases anxiety) -Start Celexa 20 mg daily at bedtime, assists with sleep also  Anxiety: -Recommend increasing gabapentin 100 mg daily to BID -Recommend discontinuing Xanax 0.5 mg TID -Recommend Klonopin 0.5 mg BID (longer acting) Psych recommendations have been applied. Responding well to current psych medications regimen.  Resolving acute metabolic encephalopathy in the setting of mental illness Management as above EEG no seizure seen. One-to-one on 05/04/2019 for patient's own safety Continue fall precautions  COPD Continue Combivent and Albuterol -Continue Breo Ellipta -Appears to be compensated at this time O2 saturation 96% on room air.  HLD -She does not appear to be taking medications for this issue at this time  follow-up with PCP  Chronic diastolic CHF -Appears to be compensated -12/04/18 echo with preserved EF and "impaired" relaxation  Obesity Body mass index is 31.24 kg/m Recommend weight loss outpatient with regular physical activity activity and healthy dieting.  Physical debility PT OT to assess Continue PT OT with fall precautions   Family Communication/Anticipated D/C date and plan/Code Status   DVT prophylaxis: Lovenox subcu daily Code Status: Full Code.  Family Communication:  Disposition Plan:  Possible DC on 05/06/19 pending PT OT evaluation. Medical Consultants:    Neuro  psych      Objective: Vitals:   05/05/19 0059 05/05/19 0426 05/05/19 0801 05/05/19 0821  BP: 126/72 (!) 124/103 104/63   Pulse: (!) 54 91 63   Resp:  16 18   Temp:  97.7 F (36.5 C) 98.1 F (36.7 C)   TempSrc:  Oral Oral   SpO2:  95% 96% 96%  Weight:      Height:  No intake or output data in the 24 hours ending 05/05/19 1057 Filed Weights   05/01/19 1100 05/01/19 1437  Weight: 80 kg 80 kg    Exam:  . General: 58 y.o. year-old female well-developed well-nourished in no  acute distress.  Alert with minimal interaction. . Cardiovascular: Regular rate and rhythm no rubs or gallops.  Respiratory: Clear consultation no wheezes or rales. . Abdomen: Soft nontender nondistended normal bowel sounds.  Musculoskeletal: No lower extremity edema.  2 out of 4 pulses in all 4 extremities.   Marland Kitchen Psychiatry: Mood is appropriate for condition and setting.   Data Reviewed: CBC: Recent Labs  Lab 05/01/19 1133 05/01/19 1151 05/02/19 0311 05/03/19 0220  WBC  --  8.5 6.8 7.2  NEUTROABS  --  6.5  --   --   HGB 17.0* 15.9* 13.9 13.4  HCT 50.0* 47.7* 42.0 40.6  MCV  --  85.5 84.8 85.5  PLT  --  205 206 [id number]   Basic Metabolic Panel: Recent Labs  Lab 05/01/19 1133 05/01/19 1151 05/02/19 0311 05/03/19 0220 05/04/19 0650  NA 135 133* 134* 136 136  K 4.3 3.8 3.4* 3.4* 3.6  CL 102 97* 100 103 102  CO2  --  21* 23 24 22   GLUCOSE 105* 109* 106* 107* 113*  BUN 24* 16 20 23* 16  CREATININE 0.50 0.58 0.62 0.55 0.48  CALCIUM  --  9.9 9.1 8.9 9.1  MG  --   --   --   --  1.6*   GFR: Estimated Creatinine Clearance: 77.7 mL/min (by C-G formula based on SCr of 0.48 mg/dL). Liver Function Tests: Recent Labs  Lab 05/01/19 1151  AST 19  ALT 18  ALKPHOS 78  BILITOT 0.9  PROT 7.9  ALBUMIN 3.9   No results for input(s): LIPASE, AMYLASE in the last 168 hours. No results for input(s): AMMONIA in the last 168 hours. Coagulation Profile: Recent Labs  Lab 05/01/19 1147  INR 1.0   Cardiac Enzymes: No results for input(s): CKTOTAL, CKMB, CKMBINDEX, TROPONINI in the last 168 hours. BNP (last 3 results) No results for input(s): PROBNP in the last 8760 hours. HbA1C: No results for input(s): HGBA1C in the last 72 hours. CBG: Recent Labs  Lab 05/04/19 0557 05/04/19 1115 05/04/19 1623 05/04/19 2104 05/05/19 0606  GLUCAP 118* 94 103* 86 126*   Lipid Profile: No results for input(s): CHOL, HDL, LDLCALC, TRIG, CHOLHDL, LDLDIRECT in the last 72 hours. Thyroid Function  Tests: No results for input(s): TSH, T4TOTAL, FREET4, T3FREE, THYROIDAB in the last 72 hours. Anemia Panel: No results for input(s): VITAMINB12, FOLATE, FERRITIN, TIBC, IRON, RETICCTPCT in the last 72 hours. Urine analysis:    Component Value Date/Time   COLORURINE YELLOW 05/02/2019 1828   APPEARANCEUR CLEAR 05/02/2019 1828   APPEARANCEUR Clear 06/22/2012 0618   LABSPEC 1.033 (H) 05/02/2019 1828   LABSPEC 1.003 06/22/2012 0618   PHURINE 6.0 05/02/2019 1828   GLUCOSEU NEGATIVE 05/02/2019 1828   GLUCOSEU Negative 06/22/2012 0618   GLUCOSEU NEGATIVE 04/09/2011 1058   HGBUR NEGATIVE 05/02/2019 1828   BILIRUBINUR NEGATIVE 05/02/2019 1828   BILIRUBINUR Negative 06/22/2012 0618   KETONESUR 20 (A) 05/02/2019 1828   PROTEINUR NEGATIVE 05/02/2019 1828   UROBILINOGEN 1.0 08/18/2011 1805   NITRITE NEGATIVE 05/02/2019 1828   LEUKOCYTESUR NEGATIVE 05/02/2019 1828   LEUKOCYTESUR Trace 06/22/2012 0618   Sepsis Labs: @LABRCNTIP (procalcitonin:4,lacticidven:4)  ) Recent Results (from the past 240 hour(s))  SARS CORONAVIRUS 2 (TAT 6-24 HRS) Nasopharyngeal Nasopharyngeal Swab  Status: None   Collection Time: 05/01/19  1:47 PM   Specimen: Nasopharyngeal Swab  Result Value Ref Range Status   SARS Coronavirus 2 NEGATIVE NEGATIVE Final    Comment: (NOTE) SARS-CoV-2 target nucleic acids are NOT DETECTED. The SARS-CoV-2 RNA is generally detectable in upper and lower respiratory specimens during the acute phase of infection. Negative results do not preclude SARS-CoV-2 infection, do not rule out co-infections with other pathogens, and should not be used as the sole basis for treatment or other patient management decisions. Negative results must be combined with clinical observations, patient history, and epidemiological information. The expected result is Negative. Fact Sheet for Patients: SugarRoll.be Fact Sheet for Healthcare Providers:  https://www.woods-mathews.com/ This test is not yet approved or cleared by the [place] FDA and  has been authorized for detection and/or diagnosis of SARS-CoV-2 by FDA under an Emergency Use Authorization (EUA). This EUA will remain  in effect (meaning this test can be used) for the duration of the COVID-19 declaration under Section 56 4(b)(1) of the Act, 21 U.S.C. section 360bbb-3(b)(1), unless the authorization is terminated or revoked sooner. Performed at [facility], [place] [address] [place], [place] [address]       Studies: No results found.  Scheduled Meds: . aspirin EC  81 mg Oral Daily  . citalopram  20 mg Oral QHS  . clonazePAM  0.5 mg Oral BID  . docusate sodium  100 mg Oral BID  . enoxaparin (LOVENOX) injection  40 mg Subcutaneous Q24H  . feeding supplement (ENSURE ENLIVE)  237 mL Oral Q24H  . feeding supplement (PRO-STAT SUGAR FREE 64)  30 mL Oral BID  . fluticasone  2 spray Each Nare BID  . fluticasone furoate-vilanterol  1 puff Inhalation Daily  . gabapentin  100 mg Oral BID  . guaiFENesin  1,200 mg Oral Daily  . ipratropium-albuterol  3 mL Nebulization BID  . lisinopril  40 mg Oral Daily  . loratadine  10 mg Oral Daily  . lurasidone  20 mg Oral BID  . Melatonin  6 mg Oral QHS  . metoprolol tartrate  25 mg Oral BID  . NIFEdipine  90 mg Oral Daily  . OLANZapine  5 mg Oral BID  . pantoprazole  40 mg Oral QHS  . polyethylene glycol  17 g Oral Daily  . sodium chloride  1 g Oral TID WC  . valproic acid  500 mg Oral BID    Continuous Infusions:   LOS: 3 days     Kayleen Memos, MD [facility] Pager [phone]  If 7PM-7AM, please contact night-coverage [email or web address] Password [email or web address] 05/05/2019, 10:57 AM

## 2019-05-05 NOTE — Evaluation (Signed)
Physical Therapy Evaluation Patient Details Name: Natalie Thomas MRN: [id number] DOB: 11-05-1960 Today's Date: 05/05/2019   History of Present Illness  Natalie Thomas is an 58 y.o. female with medical history significant of bipolar/schizoaffective d/o;  HTN; HLD: DM; COPD; chronic hyponatremia; and CHF presenting with difficulty talking.  Initially code stroke called but MRI neg. Psych has made new med recs. HTN crisis upon arrival.   Clinical Impression  Pt admitted with above diagnosis. Pt moaning and agitated in bed upon PT arrival. Withdraws to all touch. Sat EOB with mod A but maintained <1 min before expressing discomfort and leaning to return to supine. Mod A to achieve LE's into bed. Encouraged pt to try to eat some of her breakfast and she would not. Followed 25% of simple, one step commands. Did not speak except for one bout in which she was very emotional and clearly said, "I want my family, I want my mama, I want my brother".  Pt currently with functional limitations due to the deficits listed below (see PT Problem List). Pt will benefit from skilled PT to increase their independence and safety with mobility to allow discharge to the venue listed below.       Follow Up Recommendations SNF;Supervision/Assistance - 24 hour    Equipment Recommendations  None recommended by PT    Recommendations for Other Services       Precautions / Restrictions Precautions Precautions: Fall Restrictions Weight Bearing Restrictions: No      Mobility  Bed Mobility Overal bed mobility: Needs Assistance Bed Mobility: Supine to Sit;Sit to Supine     Supine to sit: Mod assist Sit to supine: Mod assist   General bed mobility comments: pt initiated coming to EOB when cued with hand gesture, was able to slide LE's off bed but needed mod A to gain balance EOB. Mod A for LE's to return to supine, pt managed upper body.   Transfers                 General transfer comment: could  not tolerate  Ambulation/Gait             General Gait Details: appears unable at this point  Stairs            Wheelchair Mobility    Modified Rankin (Stroke Patients Only)       Balance Overall balance assessment: Needs assistance Sitting-balance support: Feet supported;Bilateral upper extremity supported Sitting balance-Leahy Scale: Fair Sitting balance - Comments: pt maintained sitting with supervision. Only sat for ~1 min before expressing displeasure (pain?) and beginning to return to supine                                     Pertinent Vitals/Pain Pain Assessment: Faces Faces Pain Scale: Hurts even more Pain Location: generalized, withdraws and grimaces to all touch and moaning. Seems like she has some pain but also seems like she has had some trauma and some of her withdrawal is not due to pain Pain Descriptors / Indicators: Moaning;Grimacing Pain Intervention(s): Limited activity within patient's tolerance    Home Living Family/patient expects to be discharged to:: Assisted living               Home Equipment: Walker - 2 wheels Additional Comments: info from chart, pt aphasic, not answering questions on eval    Prior Function Level of Independence: Needs assistance  Comments: unsure how much assist she was getting at facility     Hand Dominance   Dominant Hand: Right    Extremity/Trunk Assessment   Upper Extremity Assessment Upper Extremity Assessment: Generalized weakness    Lower Extremity Assessment Lower Extremity Assessment: Generalized weakness    Cervical / Trunk Assessment Cervical / Trunk Assessment: Kyphotic  Communication   Communication: Receptive difficulties;Expressive difficulties  Cognition Arousal/Alertness: Awake/alert Behavior During Therapy: Restless Overall Cognitive Status: Difficult to assess                                 General Comments: the only clear words that  pt spoke were, "want my family, I want my mama, I want my brother". Otherwise no intelligible speech. Pt followed simple commands 25% of time.       General Comments      Exercises     Assessment/Plan    PT Assessment Patient needs continued PT services  PT Problem List Decreased strength;Decreased activity tolerance;Decreased balance;Decreased mobility;Decreased cognition;Decreased safety awareness;Decreased knowledge of precautions;Pain       PT Treatment Interventions DME instruction;Gait training;Functional mobility training;Therapeutic activities;Therapeutic exercise;Balance training;Neuromuscular re-education;Cognitive remediation;Patient/family education    PT Goals (Current goals can be found in the Care Plan section)  Acute Rehab PT Goals Patient Stated Goal: unable to state PT Goal Formulation: Patient unable to participate in goal setting Time For Goal Achievement: 05/19/19 Potential to Achieve Goals: Fair    Frequency Min 2X/week   Barriers to discharge        Co-evaluation               AM-PAC PT "6 Clicks" Mobility  Outcome Measure Help needed turning from your back to your side while in a flat bed without using bedrails?: A Lot Help needed moving from lying on your back to sitting on the side of a flat bed without using bedrails?: A Lot Help needed moving to and from a bed to a chair (including a wheelchair)?: Total Help needed standing up from a chair using your arms (e.g., wheelchair or bedside chair)?: Total Help needed to walk in hospital room?: Total Help needed climbing 3-5 steps with a railing? : Total 6 Click Score: 8    End of Session Equipment Utilized During Treatment: Oxygen Activity Tolerance: Treatment limited secondary to agitation;Other (comment)(aphasia) Patient left: in bed;with call bell/phone within reach;with bed alarm set(telasys) Nurse Communication: Mobility status PT Visit Diagnosis: Muscle weakness (generalized)  (M62.81);Pain;Other abnormalities of gait and mobility (R26.89);Apraxia (R48.2) Pain - part of body: (generalized)    Time: [id number] PT Time Calculation (min) (ACUTE ONLY): 12 min   Charges:   PT Evaluation $PT Eval Moderate Complexity: [employer], [place]  Pager [phone] Office [place] 05/05/2019, 11:29 AM

## 2019-05-06 DIAGNOSIS — E119 Type 2 diabetes mellitus without complications: Secondary | ICD-10-CM

## 2019-05-06 DIAGNOSIS — J42 Unspecified chronic bronchitis: Secondary | ICD-10-CM

## 2019-05-06 LAB — CBC
HCT: 41.5 % (ref 36.0–46.0)
Hemoglobin: 13.7 g/dL (ref 12.0–15.0)
MCH: 28.7 pg (ref 26.0–34.0)
MCHC: 33 g/dL (ref 30.0–36.0)
MCV: 86.8 fL (ref 80.0–100.0)
Platelets: 194 10*3/uL (ref 150–400)
RBC: 4.78 MIL/uL (ref 3.87–5.11)
RDW: 14 % (ref 11.5–15.5)
WBC: 8.8 10*3/uL (ref 4.0–10.5)
nRBC: 0 % (ref 0.0–0.2)

## 2019-05-06 LAB — GLUCOSE, CAPILLARY
Glucose-Capillary: 90 mg/dL (ref 70–99)
Glucose-Capillary: 113 mg/dL — ABNORMAL HIGH (ref 70–99)
Glucose-Capillary: 85 mg/dL (ref 70–99)
Glucose-Capillary: 112 mg/dL — ABNORMAL HIGH (ref 70–99)

## 2019-05-06 LAB — BASIC METABOLIC PANEL
Anion gap: 12 (ref 5–15)
BUN: 31 mg/dL — ABNORMAL HIGH (ref 6–20)
CO2: 21 mmol/L — ABNORMAL LOW (ref 22–32)
Calcium: 9.1 mg/dL (ref 8.9–10.3)
Chloride: 104 mmol/L (ref 98–111)
Creatinine, Ser: 0.74 mg/dL (ref 0.44–1.00)
GFR calc Af Amer: >60 mL/min (ref >60)
GFR calc non Af Amer: >60 mL/min (ref >60)
Glucose, Bld: 73 mg/dL (ref 70–99)
Potassium: 3.8 mmol/L (ref 3.5–5.1)
Sodium: 137 mmol/L (ref 135–145)

## 2019-05-06 MED ORDER — CLONAZEPAM 0.5 MG PO TABS
1.0000 mg | ORAL_TABLET | Freq: Two times a day (BID) | ORAL | Status: DC
  Administered 2019-05-06 – 2019-05-15 (×18): 1 mg via ORAL
  Filled 2019-05-06 (×18): qty 2

## 2019-05-06 MED ORDER — NON FORMULARY: 6.0000 mg | Freq: Every day | Status: DC

## 2019-05-06 NOTE — Progress Notes (Signed)
Occupational Therapy Evaluation Patient Details Name: Natalie Thomas MRN: [id number] DOB: 02/03/1961 Today's Date: 05/06/2019    History of Present Illness Natalie Thomas is an 58 y.o. female with medical history significant of bipolar/schizoaffective d/o;  HTN; HLD: DM; COPD; chronic hyponatremia; and CHF presenting with difficulty talking.  Initially code stroke called but MRI neg. Psych has made new med recs. HTN crisis upon arrival.    Clinical Impression   Upon arrival, pt yelling nonsensically, when asked if she was hearing/seeing anyone pt glared at therapist. Session limited this date secondary to pt's level of agitation. Session focused on use of therapeutic use of self, calming music, and decreasing environmental distractions. Pt appeared to calm down after 56min of calming music and minimal environmental distractions, becoming more participatory, washing her face with washcloth and speaking with a calm tone. Upon RN arrival to provide medication, pt resumed yelling.  Recommend SNF level therapies follow d/c. Pt will continue to benefit from skilled OT services to maximize safety and independence with ADL/IADL and functional mobility. Will continue to follow acutely and progress as tolerated.      Follow Up Recommendations  SNF;Supervision/Assistance - 24 hour    Equipment Recommendations  Other (comment)(defer to next venue)    Recommendations for Other Services       Precautions / Restrictions Precautions Precautions: Fall Restrictions Weight Bearing Restrictions: No      Mobility Bed Mobility               General bed mobility comments: deferred  Transfers                 General transfer comment: deferred    Balance                                           ADL either performed or assessed with clinical judgement   ADL Overall ADL's : Needs assistance/impaired Eating/Feeding: Set up;Sitting Eating/Feeding Details  (indicate cue type and reason): provided pt with water, able to hold cup and drink Grooming: Minimal assistance;Wash/dry face                                 General ADL Comments: deferred mobility this session secondary to pt's level of arousal, agitation, and insufficient de-escalation strategies,      Vision         Perception     Praxis      Pertinent Vitals/Pain Pain Assessment: No/denies pain Pain Intervention(s): Monitored during session     Hand Dominance Right   Extremity/Trunk Assessment Upper Extremity Assessment Upper Extremity Assessment: Generalized weakness   Lower Extremity Assessment Lower Extremity Assessment: Defer to PT evaluation       Communication Communication Communication: Receptive difficulties;Expressive difficulties   Cognition Arousal/Alertness: Awake/alert Behavior During Therapy: Agitated;Anxious;Restless Overall Cognitive Status: Difficult to assess                                 General Comments: pt yelling upon arrival, appeared to be having discussion with someone, when therapist asked pt if she was seeing/hearing someone, pt glared back at therapist. Pt continued to yell stating "everyone in here wants to hurt me" "don't you hit me or ill hit back", therapist utilized de-escalation strategies,  communicating with pt and use of relaxing music;after 23min of music and no conversation pt appeared calmer, when prompted to wash her face with towel, pt completed task after 46min. RN arrived to give pt medication, pt resumed yelling   General Comments  VSS    Exercises     Shoulder Instructions      Home Living Family/patient expects to be discharged to:: Assisted living                             Home Equipment: Walker - 2 wheels   Additional Comments: info per chart, pt perseverating about getting out of hospital, not answering questions      Prior Functioning/Environment Level of  Independence: Needs assistance      Communication / Swallowing Assistance Needed: apparently, pt had stopped eating prior to hospitalization Comments: unsure how much assist she was getting at facility        OT Problem List: Impaired balance (sitting and/or standing);Decreased activity tolerance;Decreased strength;Decreased safety awareness;Decreased cognition;Decreased knowledge of use of DME or AE;Decreased knowledge of precautions      OT Treatment/Interventions: Self-care/ADL training;Therapeutic exercise;DME and/or AE instruction;Therapeutic activities;Cognitive remediation/compensation;Patient/family education;Balance training    OT Goals(Current goals can be found in the care plan section) Acute Rehab OT Goals Patient Stated Goal: "to get out of here" OT Goal Formulation: With patient Time For Goal Achievement: 05/20/19 Potential to Achieve Goals: Fair ADL Goals Pt Will Perform Grooming: with set-up Pt Will Perform Upper Body Dressing: with set-up Pt Will Perform Lower Body Dressing: with supervision;sit to/from stand Pt Will Transfer to Toilet: with min assist;stand pivot transfer Additional ADL Goal #1: Pt will follow one-step commands consistently. Additional ADL Goal #2: Pt will engage in therapist led ADL 75% of the time.  OT Frequency: Min 2X/week   Barriers to D/C: Decreased caregiver support          Co-evaluation              AM-PAC OT "6 Clicks" Daily Activity     Outcome Measure Help from another person eating meals?: A Little Help from another person taking care of personal grooming?: A Little Help from another person toileting, which includes using toliet, bedpan, or urinal?: Total Help from another person bathing (including washing, rinsing, drying)?: Total Help from another person to put on and taking off regular upper body clothing?: Total Help from another person to put on and taking off regular lower body clothing?: Total 6 Click Score: 10    End of Session Nurse Communication: Mobility status;Other (comment)(use of music for relaxation strategies)  Activity Tolerance: Treatment limited secondary to agitation Patient left: in bed;with call bell/phone within reach;with bed alarm set;with nursing/sitter in room  OT Visit Diagnosis: Other abnormalities of gait and mobility (R26.89);Other symptoms and signs involving cognitive function                Time: 0911-0927 OT Time Calculation (min): 16 min Charges:  OT General Charges $OT Visit: 1 Visit OT Evaluation $OT Eval Moderate Complexity: [place] OTR/L Acute Rehabilitation Services Office: [place] 05/06/2019, 10:19 AM

## 2019-05-06 NOTE — [place] FL2 (Signed)
[place] MEDICAID FL2 LEVEL OF CARE SCREENING TOOL     IDENTIFICATION  Patient Name: Natalie Thomas Birthdate: [birth year] Sex: female Admission Date (Current Location): 05/01/2019  [facility] and [place] Number:  Herbalist and Address:  [facility] [place], [place] [address] [place], [place] [address]      Provider Number: [id number]  Attending Physician Name and Address:  Jonnie Finner, DO  Relative Name and Phone Number:       Current Level of Care: Hospital Recommended Level of Care: [place] Prior Approval Number:    Date Approved/Denied:   PASRR Number: PASAR closed 05/06/2019  Discharge Plan: SNF    Current Diagnoses: Patient Active Problem List   Diagnosis Date Noted  . Hypertensive crisis 05/01/2019  . Abdominal pain 03/15/2019  . Pleural effusion 03/13/2019  . Atelectasis 03/13/2019  . Chronic diastolic CHF (congestive heart failure) ([place]) 03/13/2019  . Type 2 diabetes mellitus without complication ([place]) [id number]  . Urinary tract bacterial infections 03/09/2019  . Acute cystitis without hematuria   . Constipation   . Acute hypoxemic respiratory failure ([place]) 12/03/2018  . Irritability 11/09/2018  . Aggressive behavior 11/09/2018  . Agitation   . S/P right hip fracture 11/07/2017  . Closed comminuted intertrochanteric fracture of right femur ([place]) 11/07/2017  . Anxiety and depression   . Dyspnea 10/16/2017  . Anemia 10/16/2017  . COPD exacerbation ([place]) 10/07/2017  . Hyperlipidemia 10/07/2017  . Acute on chronic respiratory failure with hypoxia and hypercapnia ([place]) 10/07/2017  . Incontinence of urine 08/14/2011  . Schizoaffective disorder, bipolar type ([place]) 07/05/2011  . Cough 05/08/2011  . Lumbar disc disease 04/09/2011  . Polycythemia secondary to smoking 04/09/2011  . HIP PAIN, LEFT 12/12/2007  . 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 DM (diabetes mellitus), type 2 ([place]) 04/08/2007  . MENOPAUSE,  PREMATURE 04/08/2007  . Chronic hyponatremia 04/08/2007  . Essential hypertension 04/08/2007  . COPD (chronic obstructive pulmonary disease) ([place]) 04/08/2007  . GERD 04/08/2007  . Osteoporosis 04/08/2007    Orientation RESPIRATION BLADDER Height & Weight     Self  Normal Incontinent Weight: 80 kg Height:  5\' 3"  (160 cm)  BEHAVIORAL SYMPTOMS/MOOD NEUROLOGICAL BOWEL NUTRITION STATUS      Incontinent Diet(heart healthy/ carb  modified)  AMBULATORY STATUS COMMUNICATION OF NEEDS Skin   Extensive Assist Verbally (MASD to bilateral groins)                       Personal Care Assistance Level of Assistance  Bathing, Feeding, Dressing Bathing Assistance: Limited assistance Feeding assistance: Independent Dressing Assistance: Limited assistance     Functional Limitations Info  Sight, Hearing, Speech Sight Info: Impaired Hearing Info: Adequate Speech Info: Impaired    SPECIAL CARE FACTORS FREQUENCY  PT (By licensed PT), OT (By licensed OT)     PT Frequency: 5x/wk OT Frequency: 5x/wk            Contractures Contractures Info: Not present    Additional Factors Info  Code Status, Allergies, Psychotropic Code Status Info: Full Allergies Info: Clarithromycin and Penicillin Psychotropic Info: Celexa 20 mg at bedtime/ Klonopin 0.5 mg BID/ Neurontin 100 mg BID/ Latuda 20 mg BID/ melatonin 6 mg at bedtime/ Zyprexa 5 mg BID/ Dapakene 500 mg BID         Current Medications (05/06/2019):  This is the current hospital active medication list Current Facility-Administered Medications  Medication Dose Route Frequency Provider Last Rate Last Dose  .  acetaminophen (TYLENOL) tablet 650 mg  650 mg Oral Q6H PRN Karmen Bongo, MD       Or  . acetaminophen (TYLENOL) suppository 650 mg  650 mg Rectal Q6H PRN Karmen Bongo, MD      . albuterol (PROVENTIL) (2.5 MG/3ML) 0.083% nebulizer solution 3 mL  3 mL Inhalation Q4H PRN Karmen Bongo, MD      . aspirin EC tablet 81 mg  81 mg  Oral Daily Karmen Bongo, MD   81 mg at 05/06/19 0942  . citalopram (CELEXA) tablet 20 mg  20 mg Oral QHS Hall, Carole N, DO   20 mg at 05/05/19 2200  . clonazePAM (KLONOPIN) tablet 0.5 mg  0.5 mg Oral BID Irene Pap N, DO   0.5 mg at 05/06/19 0941  . docusate sodium (COLACE) capsule 100 mg  100 mg Oral BID Karmen Bongo, MD   100 mg at 05/06/19 0941  . enoxaparin (LOVENOX) injection 40 mg  40 mg Subcutaneous Q24H Karmen Bongo, MD   40 mg at 05/05/19 2157  . feeding supplement (ENSURE ENLIVE) (ENSURE ENLIVE) liquid 237 mL  237 mL Oral Q24H Karmen Bongo, MD   237 mL at 05/06/19 0947  . feeding supplement (PRO-STAT SUGAR FREE 64) liquid 30 mL  30 mL Oral BID Karmen Bongo, MD   30 mL at 05/06/19 0941  . fluticasone (FLONASE) 50 MCG/ACT nasal spray 2 spray  2 spray Each Nare BID Karmen Bongo, MD   2 spray at 05/06/19 0948  . fluticasone furoate-vilanterol (BREO ELLIPTA) 100-25 MCG/INH 1 puff  1 puff Inhalation Daily Karmen Bongo, MD   1 puff at 05/05/19 [phone]  . gabapentin (NEURONTIN) capsule 100 mg  100 mg Oral BID Irene Pap N, DO   100 mg at 05/06/19 [id number]  . guaiFENesin (MUCINEX) 12 hr tablet 1,200 mg  1,200 mg Oral Daily Karmen Bongo, MD   1,200 mg at 05/06/19 0941  . guaifenesin (ROBITUSSIN) 100 MG/5ML syrup 200 mg  200 mg Oral TID PRN Karmen Bongo, MD      . hydrALAZINE (APRESOLINE) injection 5 mg  5 mg Intravenous Q4H PRN Karmen Bongo, MD   5 mg at 05/02/19 0517  . lisinopril (ZESTRIL) tablet 40 mg  40 mg Oral Daily Karmen Bongo, MD   40 mg at 05/06/19 0941  . loratadine (CLARITIN) tablet 10 mg  10 mg Oral Daily Karmen Bongo, MD   10 mg at 05/06/19 0942  . lurasidone (LATUDA) tablet 20 mg  20 mg Oral BID Irene Pap N, DO   20 mg at 05/06/19 0941  . Melatonin TABS 6 mg  6 mg Oral QHS Karmen Bongo, MD   6 mg at 05/05/19 2159  . methocarbamol (ROBAXIN) tablet 500 mg  500 mg Oral Q8H PRN Karmen Bongo, MD      . metoprolol tartrate (LOPRESSOR) tablet 25 mg   25 mg Oral BID Karmen Bongo, MD   25 mg at 05/06/19 0941  . NIFEdipine (PROCARDIA XL/NIFEDICAL-XL) 24 hr tablet 90 mg  90 mg Oral Daily Karmen Bongo, MD   90 mg at 05/06/19 0941  . OLANZapine (ZYPREXA) tablet 5 mg  5 mg Oral BID Irene Pap N, DO   5 mg at 05/06/19 0941  . ondansetron (ZOFRAN) tablet 4 mg  4 mg Oral Q6H PRN Karmen Bongo, MD       Or  . ondansetron [facility]) injection 4 mg  4 mg Intravenous Q6H PRN Karmen Bongo, MD   4 mg at 05/04/19 0748  .  oxyCODONE (Oxy IR/ROXICODONE) immediate release tablet 5 mg  5 mg Oral Q12H PRN Karmen Bongo, MD   5 mg at 05/03/19 1835  . pantoprazole (PROTONIX) EC tablet 40 mg  40 mg Oral Ivery Quale, MD   40 mg at 05/05/19 2202  . polyethylene glycol (MIRALAX / GLYCOLAX) packet 17 g  17 g Oral Daily Karmen Bongo, MD   17 g at 05/06/19 0948  . sodium chloride tablet 1 g  1 g Oral TID WC Karmen Bongo, MD   1 g at 05/06/19 0941  . valproic acid (DEPAKENE) 250 MG capsule 500 mg  500 mg Oral BID Karmen Bongo, MD   500 mg at 05/06/19 [id number]     Discharge Medications: Please see discharge summary for a list of discharge medications.  Relevant Imaging Results:  Relevant Lab Results:   Additional Information SS#: [id number] has a legal guardian through [facility]: Silvano Rusk [phone] History of MRSA  Pollie Friar, RN

## 2019-05-06 NOTE — TOC Initial Note (Signed)
Transition of Care [facility]) - Initial/Assessment Note    Patient Details  Name: VELETA GUCK MRN: [id number] Date of Birth: 1960/06/26  [facility]) CM/SW Contact:    Pollie Friar, RN Phone Number: 05/06/2019, 11:18 AM  Clinical Narrative:                 CM called and spoke to patients legal guardian through [facility]: Silvano Rusk. She is agreeable with patient going to SNF for rehab. She asked that patient be faxed out in [facility]. She states the patient has been [facility] in the past and if they offer she prefers them.  FL2 completed and patient faxed out. Unable to obtain a PASAR today d/t the holiday.  TOC following.  Expected Discharge Plan: Skilled Nursing Facility Barriers to Discharge: Continued Medical Work up   Patient Goals and CMS Choice   CMS Medicare.gov Compare Post Acute Care list provided to:: Patient Represenative (must comment) Choice offered to / list presented to : (legal guardian)  Expected Discharge Plan and Services Expected Discharge Plan: [place] In-house Referral: Clinical Social Work Discharge Planning Services: CM Consult Post Acute Care Choice: [place] arrangements for the past 2 months: Assisted Living Facility([facility])                                      Prior Living Arrangements/Services Living arrangements for the past 2 months: Assisted Living Facility([facility]) Lives with:: Facility Resident          Need for Family Participation in Patient Care: Yes (Comment) Care giver support system in place?: No (comment)   Criminal Activity/Legal Involvement Pertinent to Current Situation/Hospitalization: No - Comment as needed  Activities of Daily Living      Permission Sought/Granted                  Emotional Assessment Appearance:: Appears stated age Attitude/Demeanor/Rapport: Aggressive (Verbally and/or physically), Angry Affect  (typically observed): Restless, Irritable Orientation: : Oriented to Self   Psych Involvement: No (comment)  Admission diagnosis:  Stroke Patient Active Problem List   Diagnosis Date Noted  . Hypertensive crisis 05/01/2019  . Abdominal pain 03/15/2019  . Pleural effusion 03/13/2019  . Atelectasis 03/13/2019  . Chronic diastolic CHF (congestive heart failure) ([place]) 03/13/2019  . Type 2 diabetes mellitus without complication ([place]) [id number]  . Urinary tract bacterial infections 03/09/2019  . Acute cystitis without hematuria   . Constipation   . Acute hypoxemic respiratory failure ([place]) 12/03/2018  . Irritability 11/09/2018  . Aggressive behavior 11/09/2018  . Agitation   . S/P right hip fracture 11/07/2017  . Closed comminuted intertrochanteric fracture of right femur ([place]) 11/07/2017  . Anxiety and depression   . Dyspnea 10/16/2017  . Anemia 10/16/2017  . COPD exacerbation ([place]) 10/07/2017  . Hyperlipidemia 10/07/2017  . Acute on chronic respiratory failure with hypoxia and hypercapnia ([place]) 10/07/2017  . Incontinence of urine 08/14/2011  . Schizoaffective disorder, bipolar type ([place]) 07/05/2011  . Cough 05/08/2011  . Lumbar disc disease 04/09/2011  . Polycythemia secondary to smoking 04/09/2011  . HIP PAIN, LEFT 12/12/2007  . 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 DM (diabetes mellitus), type 2 ([place]) 04/08/2007  . MENOPAUSE, PREMATURE 04/08/2007  . Chronic hyponatremia 04/08/2007  . Essential hypertension 04/08/2007  . COPD (chronic obstructive pulmonary disease) ([place]) 04/08/2007  . GERD 04/08/2007  . Osteoporosis 04/08/2007  PCP:  System, Pcp Not In Pharmacy:   [facility], [address] [place] [address] Phone: [phone] Fax: [phone]     Social Determinants of Health (SDOH) Interventions    Readmission Risk Interventions Readmission Risk Prevention Plan 03/10/2019 12/08/2018  Transportation Screening  Complete Complete  Medication Review Press photographer) Complete Complete  PCP or Specialist appointment within 3-5 days of discharge Complete Complete  HRI or Home Care Consult Complete Complete  SW Recovery Care/Counseling Consult Complete Complete  Palliative Care Screening Not Applicable Not Applicable  Skilled Nursing Facility Complete Complete  Some recent data might be hidden

## 2019-05-06 NOTE — Progress Notes (Signed)
[facility]  PROGRESS NOTE    Natalie Thomas  [id number] DOB: 01/14/1961 DOA: 05/01/2019 PCP: System, Pcp Not In   Brief Narrative:   Natalie Thomas an 58 y.o.femalewith medical history significant ofbipolar/schizoaffective d/o; HTN; HLD: DM; COPD; chronic hyponatremia; and CHF presenting with difficulty talking. At baseline at the time of last discharge, she was calm and comfortable, "pleasantly confused, nonsensical speech."   11/11: Angry this morning. Yelling at everyone.   Assessment & Plan:   Principal Problem:   Hypertensive crisis Active Problems:   DM (diabetes mellitus), type 2 (HCC)   COPD (chronic obstructive pulmonary disease) (HCC)   Schizoaffective disorder, bipolar type (HCC)   Hyperlipidemia   Chronic diastolic CHF (congestive heart failure) (HCC)   HTN crisis with Abnormal EKG     - Pt presented w/ AMS and speech dysfunction; ? if different from baseline     - BP is currently at goal.     - continue lisinopril 40 mg daily, Lopressor 25 mg BID, nifedipine 90 mg daily, PRN hydralazine  Schizoaffective disorder/bipolar type Anxiety     - Seen by psych; recs below         - Schizoaffective d/o; bipolar type             - Recommend tapering off of Latuda from 40 mg BID to 20 mg BID             - Start Zyprexa 5 mg BID             - Discontinue Wellbutrin 150 mg daily (increases anxiety)             - Start Celexa 20 mg daily at bedtime, assists with sleep also        - Anxiety:             - Recommend increasing gabapentin 100 mg daily to BID             - Recommend discontinuing Xanax 0.5 mg TID             - Recommend Klonopin 0.5 mg BID (longer acting)     - Psych recommendations have been applied.     - agitated this AM; yelling at everyone, not cooperative with exam; psyc re-paged  Acute metabolic encephalopathy in the setting of mental illness     - Management as above     - EEG no seizure seen.     - One-to-one on 05/04/2019 for patient's  own safety     - Continue fall precautions  COPD     - continue Combivent, Breo Ellipta, Albuterol     - no acute needs; monitor  HLD     - no home meds; follow up with PCP  Chronic diastolic CHF     - Appears to be compensated     - 12/04/18 echo with preserved EF and "impaired" relaxation     - continue metoprolol, lisinopril  Obesity     - Body mass index is 31.24 kg/m     - Recommend weight loss outpatient with regular physical activity activity and healthy dieting.  Physical debility     - PT/OT: SNF, 24-hr supervision     - continue PT/OT with fall precautions  Add melatonin. Increase klonopin.  DVT prophylaxis: lovenox Code Status: FULL   Disposition Plan: TBD  Consultants:   Psychiatry  Neurology  ROS:  Denies CP, dyspnea, N, V . Remainder 10-pt ROS is negative for  all not previously mentioned.  Subjective: "I've been in this bed for a very long time!!!"  Objective: Vitals:   05/06/19 0340 05/06/19 0711 05/06/19 0831 05/06/19 1202  BP: 113/72  (!) (P) 180/49 (P) 95/74  Pulse: 73  (P) 68 (P) 76  Resp: 20  (P) 20 (P) 20  Temp: 98 F (36.7 C)  (P) 98.7 F (37.1 C) (P) 98.3 F (36.8 C)  TempSrc: Oral  (P) Oral (P) Oral  SpO2: 94% 95% (P) 94% (P) 94%  Weight:      Height:        Intake/Output Summary (Last 24 hours) at 05/06/2019 1252 Last data filed at 05/06/2019 0745 Gross per 24 hour  Intake 940 ml  Output -  Net 940 ml   Filed Weights   05/01/19 1100 05/01/19 1437  Weight: 80 kg 80 kg    Examination:  General: 58 y.o. female resting in bed in NAD, agitated, refusing exam Respiratory: normal WOB, no accessory muscle use MSK: No e/c/c Psyc: agitated, refusing exam   Data Reviewed: I have personally reviewed following labs and imaging studies.  CBC: Recent Labs  Lab 05/01/19 1133 05/01/19 1151 05/02/19 0311 05/03/19 0220 05/06/19 0307  WBC  --  8.5 6.8 7.2 8.8  NEUTROABS  --  6.5  --   --   --   HGB 17.0* 15.9* 13.9 13.4  13.7  HCT 50.0* 47.7* 42.0 40.6 41.5  MCV  --  85.5 84.8 85.5 86.8  PLT  --  205 206 206 [id number]   Basic Metabolic Panel: Recent Labs  Lab 05/01/19 1151 05/02/19 0311 05/03/19 0220 05/04/19 0650 05/06/19 0307  NA 133* 134* 136 136 137  K 3.8 3.4* 3.4* 3.6 3.8  CL 97* 100 103 102 104  CO2 21* 23 24 22  21*  GLUCOSE 109* 106* 107* 113* 73  BUN 16 20 23* 16 31*  CREATININE 0.58 0.62 0.55 0.48 0.74  CALCIUM 9.9 9.1 8.9 9.1 9.1  MG  --   --   --  1.6*  --    GFR: Estimated Creatinine Clearance: 77.7 mL/min (by C-G formula based on SCr of 0.74 mg/dL). Liver Function Tests: Recent Labs  Lab 05/01/19 1151  AST 19  ALT 18  ALKPHOS 78  BILITOT 0.9  PROT 7.9  ALBUMIN 3.9   No results for input(s): LIPASE, AMYLASE in the last 168 hours. No results for input(s): AMMONIA in the last 168 hours. Coagulation Profile: Recent Labs  Lab 05/01/19 1147  INR 1.0   Cardiac Enzymes: No results for input(s): CKTOTAL, CKMB, CKMBINDEX, TROPONINI in the last 168 hours. BNP (last 3 results) No results for input(s): PROBNP in the last 8760 hours. HbA1C: No results for input(s): HGBA1C in the last 72 hours. CBG: Recent Labs  Lab 05/04/19 2104 05/05/19 0606 05/05/19 2130 05/06/19 0620 05/06/19 1205  GLUCAP 86 126* 99 90 113*   Lipid Profile: No results for input(s): CHOL, HDL, LDLCALC, TRIG, CHOLHDL, LDLDIRECT in the last 72 hours. Thyroid Function Tests: No results for input(s): TSH, T4TOTAL, FREET4, T3FREE, THYROIDAB in the last 72 hours. Anemia Panel: No results for input(s): VITAMINB12, FOLATE, FERRITIN, TIBC, IRON, RETICCTPCT in the last 72 hours. Sepsis Labs: No results for input(s): PROCALCITON, LATICACIDVEN in the last 168 hours.  Recent Results (from the past 240 hour(s))  SARS CORONAVIRUS 2 (TAT 6-24 HRS) Nasopharyngeal Nasopharyngeal Swab     Status: None   Collection Time: 05/01/19  1:47 PM   Specimen: Nasopharyngeal Swab  Result Value Ref Range Status   SARS  Coronavirus 2 NEGATIVE NEGATIVE Final    Comment: (NOTE) SARS-CoV-2 target nucleic acids are NOT DETECTED. The SARS-CoV-2 RNA is generally detectable in upper and lower respiratory specimens during the acute phase of infection. Negative results do not preclude SARS-CoV-2 infection, do not rule out co-infections with other pathogens, and should not be used as the sole basis for treatment or other patient management decisions. Negative results must be combined with clinical observations, patient history, and epidemiological information. The expected result is Negative. Fact Sheet for Patients: SugarRoll.be Fact Sheet for Healthcare Providers: https://www.woods-mathews.com/ This test is not yet approved or cleared by the [place] FDA and  has been authorized for detection and/or diagnosis of SARS-CoV-2 by FDA under an Emergency Use Authorization (EUA). This EUA will remain  in effect (meaning this test can be used) for the duration of the COVID-19 declaration under Section 56 4(b)(1) of the Act, 21 U.S.C. section 360bbb-3(b)(1), unless the authorization is terminated or revoked sooner. Performed at [facility], [place] [address] [place], [place] [address]       Radiology Studies: No results found.   Scheduled Meds: . aspirin EC  81 mg Oral Daily  . citalopram  20 mg Oral QHS  . clonazePAM  1 mg Oral BID  . docusate sodium  100 mg Oral BID  . enoxaparin (LOVENOX) injection  40 mg Subcutaneous Q24H  . feeding supplement (ENSURE ENLIVE)  237 mL Oral Q24H  . feeding supplement (PRO-STAT SUGAR FREE 64)  30 mL Oral BID  . fluticasone  2 spray Each Nare BID  . fluticasone furoate-vilanterol  1 puff Inhalation Daily  . gabapentin  100 mg Oral BID  . guaiFENesin  1,200 mg Oral Daily  . lisinopril  40 mg Oral Daily  . loratadine  10 mg Oral Daily  . lurasidone  20 mg Oral BID  . Melatonin  6 mg Oral QHS  . metoprolol tartrate  25  mg Oral BID  . NIFEdipine  90 mg Oral Daily  . NON FORMULARY 6 mg  6 mg Oral QHS  . OLANZapine  5 mg Oral BID  . pantoprazole  40 mg Oral QHS  . polyethylene glycol  17 g Oral Daily  . sodium chloride  1 g Oral TID WC  . valproic acid  500 mg Oral BID   Continuous Infusions:   LOS: 4 days    Time spent: 25 minutes spent in the coordination of care today.    Jonnie Finner, DO [facility] Pager [phone]  If 7PM-7AM, please contact night-coverage [email or web address] Password [email or web address] 05/06/2019, 12:52 PM

## 2019-05-07 LAB — RENAL FUNCTION PANEL
Albumin: 3 g/dL — ABNORMAL LOW (ref 3.5–5.0)
Anion gap: 10 (ref 5–15)
BUN: 28 mg/dL — ABNORMAL HIGH (ref 6–20)
CO2: 24 mmol/L (ref 22–32)
Calcium: 9 mg/dL (ref 8.9–10.3)
Chloride: 104 mmol/L (ref 98–111)
Creatinine, Ser: 0.69 mg/dL (ref 0.44–1.00)
GFR calc Af Amer: >60 mL/min (ref >60)
GFR calc non Af Amer: >60 mL/min (ref >60)
Glucose, Bld: 97 mg/dL (ref 70–99)
Phosphorus: 4.8 mg/dL — ABNORMAL HIGH (ref 2.5–4.6)
Potassium: 4.2 mmol/L (ref 3.5–5.1)
Sodium: 138 mmol/L (ref 135–145)

## 2019-05-07 LAB — CBC WITH DIFFERENTIAL/PLATELET
Abs Immature Granulocytes: 0.02 10*3/uL (ref 0.00–0.07)
Basophils Absolute: 0 10*3/uL (ref 0.0–0.1)
Basophils Relative: 1 %
Eosinophils Absolute: 0.1 10*3/uL (ref 0.0–0.5)
Eosinophils Relative: 1 %
HCT: 41.1 % (ref 36.0–46.0)
Hemoglobin: 13.7 g/dL (ref 12.0–15.0)
Immature Granulocytes: 0 %
Lymphocytes Relative: 32 %
Lymphs Abs: 2.1 10*3/uL (ref 0.7–4.0)
MCH: 28.4 pg (ref 26.0–34.0)
MCHC: 33.3 g/dL (ref 30.0–36.0)
MCV: 85.3 fL (ref 80.0–100.0)
Monocytes Absolute: 0.6 10*3/uL (ref 0.1–1.0)
Monocytes Relative: 9 %
Neutro Abs: 3.8 10*3/uL (ref 1.7–7.7)
Neutrophils Relative %: 57 %
Platelets: 188 10*3/uL (ref 150–400)
RBC: 4.82 MIL/uL (ref 3.87–5.11)
RDW: 14.1 % (ref 11.5–15.5)
WBC: 6.6 10*3/uL (ref 4.0–10.5)
nRBC: 0 % (ref 0.0–0.2)

## 2019-05-07 LAB — GLUCOSE, CAPILLARY
Glucose-Capillary: 102 mg/dL — ABNORMAL HIGH (ref 70–99)
Glucose-Capillary: 91 mg/dL (ref 70–99)
Glucose-Capillary: 190 mg/dL — ABNORMAL HIGH (ref 70–99)
Glucose-Capillary: 87 mg/dL (ref 70–99)

## 2019-05-07 LAB — MAGNESIUM: Magnesium: 1.8 mg/dL (ref 1.7–2.4)

## 2019-05-07 NOTE — Progress Notes (Signed)
Marland Kitchen  PROGRESS NOTE    Natalie Thomas  [id number] DOB: 06-05-1961 DOA: 05/01/2019 PCP: System, Pcp Not In   Brief Narrative:   Natalie Thomas an 58 y.o.femalewith medical history significant ofbipolar/schizoaffective d/o; HTN; HLD: DM; COPD; chronic hyponatremia; and CHF presenting with difficulty talking. At baseline at the time of last discharge, she was calm and comfortable, "pleasantly confused, nonsensical speech."   11/11: Angry this morning. Yelling at everyone. 11/12: Sleeping this AM. Reduced outbursts per nursing.   Assessment & Plan:   Principal Problem:   Hypertensive crisis Active Problems:   DM (diabetes mellitus), type 2 (HCC)   COPD (chronic obstructive pulmonary disease) (HCC)   Schizoaffective disorder, bipolar type (HCC)   Hyperlipidemia   Chronic diastolic CHF (congestive heart failure) (HCC)  HTN crisis with Abnormal EKG     - Pt presented w/ AMS and speech dysfunction; ? if different from baseline     - BP is currently at goal.     - continue lisinopril 40 mg daily, Lopressor 25 mg BID, nifedipine 90 mg daily, PRN hydralazine  Schizoaffective disorder/bipolar type Anxiety     - Seen by psych; recs below         - Schizoaffective d/o; bipolar type             - Recommend tapering off of Latuda from 40 mg BID to 20 mg BID             - Start Zyprexa 5 mg BID             - Discontinue Wellbutrin 150 mg daily (increases anxiety)             - Start Celexa 20 mg daily at bedtime, assists with sleep also        - Anxiety:             - Recommend increasing gabapentin 100 mg daily to BID             - Recommend discontinuing Xanax 0.5 mg TID             - Recommend Klonopin 0.5 mg BID (longer acting)     - Psych recommendations have been applied.     - agitated this AM; yelling at everyone, not cooperative with exam; psyc re-paged; unable to get in contact with psyc     - klonopin increased to 1mg  BID; continue  Acute metabolic  encephalopathy in the setting of mental illness     - Management as above     - EEG no seizure seen.     - One-to-one on 05/04/2019 for patient's own safety     - Continue fall precautions  COPD     - continue Combivent, Breo Ellipta, Albuterol     - no acute needs; monitor  HLD     - no home meds; follow up with PCP  Chronic diastolic CHF     - Appears to be compensated     - 12/04/18 echo with preserved EF and "impaired" relaxation     - continue metoprolol, lisinopril  Obesity     - Body mass index is 31.24 kg/m     - Recommend weight loss outpatient with regular physical activity activity and healthy dieting.  Physical debility     - PT/OT: SNF, 24-hr supervision     - continue PT/OT with fall precautions  Working on SNF placement. Improved behavior with increase in klonopin.   DVT  prophylaxis: lovenox Code Status: FULL   Disposition Plan: TBD  Consultants:   Psychiatry  Neurology   Subjective: No acute events ON per nursing.   Objective: Vitals:   05/06/19 2337 05/07/19 0335 05/07/19 0815 05/07/19 1225  BP: (!) 188/73 (!) 177/73 133/75 (!) 159/52  Pulse: 61 60 67 60  Resp: 17 17 20 18   Temp: 98.3 F (36.8 C) 98.8 F (37.1 C) 97.8 F (36.6 C) 98.1 F (36.7 C)  TempSrc:   Oral Axillary  SpO2:  95% 97% 94%  Weight:      Height:        Intake/Output Summary (Last 24 hours) at 05/07/2019 1425 Last data filed at 05/07/2019 1300 Gross per 24 hour  Intake 900 ml  Output 1590 ml  Net -690 ml   Filed Weights   05/01/19 1100 05/01/19 1437  Weight: 80 kg 80 kg    Examination:  General: 58 y.o. female resting in bed in NAD Cardiovascular: RRR, +S1, S2, no m/g/r, equal pulses throughout Respiratory: CTABL, no w/r/r, normal WOB GI: BS+, NDNT, no masses noted, no organomegaly noted MSK: No e/c/c Neuro: somnolent    Data Reviewed: I have personally reviewed following labs and imaging studies.  CBC: Recent Labs  Lab 05/01/19 1151 05/02/19  0311 05/03/19 0220 05/06/19 0307 05/07/19 0346  WBC 8.5 6.8 7.2 8.8 6.6  NEUTROABS 6.5  --   --   --  3.8  HGB 15.9* 13.9 13.4 13.7 13.7  HCT 47.7* 42.0 40.6 41.5 41.1  MCV 85.5 84.8 85.5 86.8 85.3  PLT 205 206 206 194 0000000   Basic Metabolic Panel: Recent Labs  Lab 05/02/19 0311 05/03/19 0220 05/04/19 0650 05/06/19 0307 05/07/19 0346  NA 134* 136 136 137 138  K 3.4* 3.4* 3.6 3.8 4.2  CL 100 103 102 104 104  CO2 23 24 22  21* 24  GLUCOSE 106* 107* 113* 73 97  BUN 20 23* 16 31* 28*  CREATININE 0.62 0.55 0.48 0.74 0.69  CALCIUM 9.1 8.9 9.1 9.1 9.0  MG  --   --  1.6*  --  1.8  PHOS  --   --   --   --  4.8*   GFR: Estimated Creatinine Clearance: 77.7 mL/min (by C-G formula based on SCr of 0.69 mg/dL). Liver Function Tests: Recent Labs  Lab 05/01/19 1151 05/07/19 0346  AST 19  --   ALT 18  --   ALKPHOS 78  --   BILITOT 0.9  --   PROT 7.9  --   ALBUMIN 3.9 3.0*   No results for input(s): LIPASE, AMYLASE in the last 168 hours. No results for input(s): AMMONIA in the last 168 hours. Coagulation Profile: Recent Labs  Lab 05/01/19 1147  INR 1.0   Cardiac Enzymes: No results for input(s): CKTOTAL, CKMB, CKMBINDEX, TROPONINI in the last 168 hours. BNP (last 3 results) No results for input(s): PROBNP in the last 8760 hours. HbA1C: No results for input(s): HGBA1C in the last 72 hours. CBG: Recent Labs  Lab 05/06/19 1205 05/06/19 1641 05/06/19 2110 05/07/19 0645 05/07/19 1205  GLUCAP 113* 85 112* 102* 91   Lipid Profile: No results for input(s): CHOL, HDL, LDLCALC, TRIG, CHOLHDL, LDLDIRECT in the last 72 hours. Thyroid Function Tests: No results for input(s): TSH, T4TOTAL, FREET4, T3FREE, THYROIDAB in the last 72 hours. Anemia Panel: No results for input(s): VITAMINB12, FOLATE, FERRITIN, TIBC, IRON, RETICCTPCT in the last 72 hours. Sepsis Labs: No results for input(s): PROCALCITON, LATICACIDVEN in the  last 168 hours.  Recent Results (from the past 240  hour(s))  SARS CORONAVIRUS 2 (TAT 6-24 HRS) Nasopharyngeal Nasopharyngeal Swab     Status: None   Collection Time: 05/01/19  1:47 PM   Specimen: Nasopharyngeal Swab  Result Value Ref Range Status   SARS Coronavirus 2 NEGATIVE NEGATIVE Final    Comment: (NOTE) SARS-CoV-2 target nucleic acids are NOT DETECTED. The SARS-CoV-2 RNA is generally detectable in upper and lower respiratory specimens during the acute phase of infection. Negative results do not preclude SARS-CoV-2 infection, do not rule out co-infections with other pathogens, and should not be used as the sole basis for treatment or other patient management decisions. Negative results must be combined with clinical observations, patient history, and epidemiological information. The expected result is Negative. Fact Sheet for Patients: SugarRoll.be Fact Sheet for Healthcare Providers: https://www.woods-mathews.com/ This test is not yet approved or cleared by the [place] FDA and  has been authorized for detection and/or diagnosis of SARS-CoV-2 by FDA under an Emergency Use Authorization (EUA). This EUA will remain  in effect (meaning this test can be used) for the duration of the COVID-19 declaration under Section 56 4(b)(1) of the Act, 21 U.S.C. section 360bbb-3(b)(1), unless the authorization is terminated or revoked sooner. Performed at [facility], [place] [address], [place], [place] [address]       Radiology Studies: No results found.   Scheduled Meds: . aspirin EC  81 mg Oral Daily  . citalopram  20 mg Oral QHS  . clonazePAM  1 mg Oral BID  . docusate sodium  100 mg Oral BID  . enoxaparin (LOVENOX) injection  40 mg Subcutaneous Q24H  . feeding supplement (ENSURE ENLIVE)  237 mL Oral Q24H  . feeding supplement (PRO-STAT SUGAR FREE 64)  30 mL Oral BID  . fluticasone  2 spray Each Nare BID  . fluticasone furoate-vilanterol  1 puff Inhalation Daily  . gabapentin   100 mg Oral BID  . guaiFENesin  1,200 mg Oral Daily  . lisinopril  40 mg Oral Daily  . loratadine  10 mg Oral Daily  . lurasidone  20 mg Oral BID  . Melatonin  6 mg Oral QHS  . metoprolol tartrate  25 mg Oral BID  . NIFEdipine  90 mg Oral Daily  . OLANZapine  5 mg Oral BID  . pantoprazole  40 mg Oral QHS  . polyethylene glycol  17 g Oral Daily  . sodium chloride  1 g Oral TID WC  . valproic acid  500 mg Oral BID   Continuous Infusions:   LOS: 5 days    Time spent: 25 minutes spent in coordination of care today.    Jonnie Finner, DO [facility] Pager [phone]  If 7PM-7AM, please contact night-coverage [email or web address] Password [email or web address] 05/07/2019, 2:25 PM

## 2019-05-07 NOTE — TOC Progression Note (Signed)
Transition of Care [facility]) - Progression Note    Patient Details  Name: Natalie Thomas MRN: [id number] Date of Birth: 04/04/1961  [facility] CM/SW Contact  Pollie Friar, RN Phone Number: 05/07/2019, 2:43 PM  Clinical Narrative:    Pt has one bed offer for SNF rehab. CM spoke to Pt's legal guardian: [name] and she is agreeable with patient going to [facility]. Janie with [facility] aware of acceptance.  Currently we await a PASAR number for pt to be able to d/c. PASAR has gone to manual review.  TOC following..   Expected Discharge Plan: Skilled Nursing Facility Barriers to Discharge: Continued Medical Work up  Expected Discharge Plan and Services Expected Discharge Plan: [place] In-house Referral: Clinical Social Work Discharge Planning Services: CM Consult Post Acute Care Choice: [place] arrangements for the past 2 months: Assisted Living Facility([place])                                       Social Determinants of Health (SDOH) Interventions    Readmission Risk Interventions Readmission Risk Prevention Plan 03/10/2019 12/08/2018  Transportation Screening Complete Complete  Medication Review Press photographer) Complete Complete  PCP or Specialist appointment within 3-5 days of discharge Complete Complete  HRI or Home Care Consult Complete Complete  SW Recovery Care/Counseling Consult Complete Complete  Palliative Care Screening Not Applicable Not [name] Complete Complete  Some recent data might be hidden

## 2019-05-07 NOTE — Care Management Important Message (Signed)
Important Message  Patient Details  Name: Natalie Thomas MRN: [id number] Date of Birth: [birth year]   Medicare Important Message Given:  Yes     Annalicia Renfrew 05/07/2019, 3:30 PM

## 2019-05-08 LAB — GLUCOSE, CAPILLARY
Glucose-Capillary: 143 mg/dL — ABNORMAL HIGH (ref 70–99)
Glucose-Capillary: 115 mg/dL — ABNORMAL HIGH (ref 70–99)
Glucose-Capillary: 136 mg/dL — ABNORMAL HIGH (ref 70–99)
Glucose-Capillary: 179 mg/dL — ABNORMAL HIGH (ref 70–99)

## 2019-05-08 LAB — CREATININE, SERUM
Creatinine, Ser: 0.61 mg/dL (ref 0.44–1.00)
GFR calc Af Amer: >60 mL/min (ref >60)
GFR calc non Af Amer: >60 mL/min (ref >60)

## 2019-05-08 NOTE — Progress Notes (Signed)
Physical Therapy Treatment Patient Details Name: Natalie Thomas MRN: [id number] DOB: 1961-06-10 Today's Date: 05/08/2019    History of Present Illness Natalie Thomas is an 58 y.o. female with medical history significant of bipolar/schizoaffective d/o;  HTN; HLD: DM; COPD; chronic hyponatremia; and CHF presenting with difficulty talking.  Initially code stroke called but MRI neg. Psych has made new med recs. HTN crisis upon arrival.     PT Comments    Pt pleasant on arrival. Pt limited by cognition and ability to follow commands/ participate in mobility. Pt able to demonstrate ability to transfer bed<>chair but unable to get pt to attempt to walk or educate for benefit of mobility in chair. Will follow to maximize function.     Follow Up Recommendations  SNF;Supervision/Assistance - 24 hour     Equipment Recommendations  None recommended by PT    Recommendations for Other Services       Precautions / Restrictions Precautions Precautions: Fall    Mobility  Bed Mobility Overal bed mobility: Needs Assistance Bed Mobility: Supine to Sit;Sit to Supine     Supine to sit: Supervision Sit to supine: Supervision   General bed mobility comments: pt cued for mobility with pt able to rise from supine on her own impulsively. return to bed also without assist and pt not following cues to expand mobility  Transfers Overall transfer level: Needs assistance   Transfers: Stand Pivot Transfers   Stand pivot transfers: Supervision;+2 safety/equipment       General transfer comment: supervision with pt pivoting bed to chair and then only a few minutes later declining mobility and returning to bed. +2 safety due to behavior/cognition  Ambulation/Gait             General Gait Details: pt would not attempt   Stairs             Wheelchair Mobility    Modified Rankin (Stroke Patients Only)       Balance Overall balance assessment: Needs  assistance Sitting-balance support: No upper extremity supported;Feet supported Sitting balance-Leahy Scale: Fair       Standing balance-Leahy Scale: Fair Standing balance comment: pt able to stand and turn but did not perform static standing long enough to truly assess                            Cognition Arousal/Alertness: Awake/alert Behavior During Therapy: Anxious;Restless Overall Cognitive Status: Difficult to assess                                 General Comments: pt calm on arrival and willing to have therapy enter. Pt very tangential and anxious with statements of "you're trying to fool me" "I wanna talk to my family". Pt then stating she wants to get up but immediately wanting to get back in bed. After settled then briefly stating wanting to walk but unable to follow through. Pt then pulling up gown stating she is going to tell her family how she is naked.      Exercises      General Comments        Pertinent Vitals/Pain Pain Assessment: No/denies pain    Home Living                      Prior Function  PT Goals (current goals can now be found in the care plan section) Progress towards PT goals: Progressing toward goals    Frequency    Min 2X/week      PT Plan Current plan remains appropriate    Co-evaluation              AM-PAC PT "6 Clicks" Mobility   Outcome Measure  Help needed turning from your back to your side while in a flat bed without using bedrails?: A Little Help needed moving from lying on your back to sitting on the side of a flat bed without using bedrails?: A Little Help needed moving to and from a bed to a chair (including a wheelchair)?: A Little Help needed standing up from a chair using your arms (e.g., wheelchair or bedside chair)?: A Little Help needed to walk in hospital room?: A Lot Help needed climbing 3-5 steps with a railing? : A Lot 6 Click Score: 16    End of Session    Activity Tolerance: Patient tolerated treatment well Patient left: in bed;with call bell/phone within reach;with bed alarm set Nurse Communication: Mobility status PT Visit Diagnosis: Other abnormalities of gait and mobility (R26.89);Unsteadiness on feet (R26.81)     Time: [id number] PT Time Calculation (min) (ACUTE ONLY): 17 min  Charges:  $Therapeutic Activity: 8-22 mins                     [name] P, PT Acute Rehabilitation Services Pager: [phone] Office: [place] Natalie Thomas 05/08/2019, 1:29 PM

## 2019-05-08 NOTE — Progress Notes (Signed)
Natalie Kitchen  PROGRESS Thomas    Natalie Thomas  [id number] DOB: 1960/08/20 DOA: 05/01/2019 PCP: System, Pcp Not In   Brief Narrative:   Natalie Thomas an 58 y.o.femalewith medical history significant ofbipolar/schizoaffective d/o; HTN; HLD: DM; COPD; chronic hyponatremia; and CHF presenting with difficulty talking. At baseline at the time of last discharge, she was calm and comfortable, "pleasantly confused, nonsensical speech."   11/11: Angry this morning. Yelling at everyone. 11/12: Sleeping this AM. Reduced outbursts per nursing. 11/13: Calmer this AM. Agreeable to examination. Working on SNF.   Assessment & Plan:   Principal Problem:   Hypertensive crisis Active Problems:   DM (diabetes mellitus), type 2 (HCC)   COPD (chronic obstructive pulmonary disease) (HCC)   Schizoaffective disorder, bipolar type (HCC)   Hyperlipidemia   Chronic diastolic CHF (congestive heart failure) (HCC)  HTN crisis with Abnormal EKG - Pt presented w/ AMS and speech dysfunction; ? if different from baseline - BP is currently at goal. - continue lisinopril 40 mg daily, Lopressor 25 mg BID, nifedipine 90 mg daily, PRN hydralazine  Schizoaffective disorder/bipolar type Anxiety - Seen by psych; recs below - Schizoaffective d/o; bipolar type - Recommend tapering off of Latuda from 40 mg BID to 20 mg BID - Start Zyprexa 5 mg BID - Discontinue Wellbutrin 150 mg daily (increases anxiety) - Start Celexa 20 mg daily at bedtime, assists with sleep also  - Anxiety: - Recommend increasing gabapentin 100 mg daily to BID - Recommend discontinuing Xanax 0.5 mg TID - Recommend Klonopin 0.5 mg BID (longer acting) - Psych recommendations have been applied. - agitated this AM; yelling at everyone, not cooperative with exam; psyc re-paged; unable to get in contact with psyc     -  klonopin increased to 1mg  BID; continue  Acute metabolic encephalopathy in the setting of mental illness - Management as above - EEG no seizure seen. - One-to-one on 05/04/2019 for patient's own safety - Continue fall precautions  COPD - continue Combivent, Breo Ellipta, Albuterol - no acute needs; monitor  HLD - no home meds; follow up with PCP  Chronic diastolic CHF - Appears to be compensated - 12/04/18 echo with preserved EF and "impaired" relaxation - continue metoprolol, lisinopril  Obesity - Body mass index is 31.24 kg/m - Recommend weight loss outpatient with regular physical activity activity and healthy dieting.  Physical debility - PT/OT: SNF, 24-hr supervision - continue PT/OT with fall precautions  DVT prophylaxis: lovenox Code Status: FULL   Disposition Plan: TBD  Consultants:   Psychiatry  Neurology  ROS:  Denies CP, ab pain, N, V . Remainder 10-pt ROS is negative for all not previously mentioned.  Subjective: "Please don't keep me here."  Objective: Vitals:   05/08/19 0417 05/08/19 0805 05/08/19 1049 05/08/19 1100  BP: (!) 183/68 98/68 131/75 131/75  Pulse: 80 80 86 86  Resp: 18 18  18   Temp: 98.1 F (36.7 C) 97.8 F (36.6 C)  98.2 F (36.8 C)  TempSrc: Oral Axillary  Oral  SpO2: 94% 96%  97%  Weight:      Height:        Intake/Output Summary (Last 24 hours) at 05/08/2019 1455 Last data filed at 05/08/2019 1300 Gross per 24 hour  Intake 2420 ml  Output 1700 ml  Net 720 ml   Filed Weights   05/01/19 1100 05/01/19 1437  Weight: 80 kg 80 kg    Examination:  General: 58 y.o. female resting in bed in NAD Cardiovascular: RRR, +  S1, S2, no m/g/r, equal pulses throughout Respiratory: CTABL, no w/r/r, normal WOB GI: BS+, NDNT, no masses noted, no organomegaly noted MSK: No e/c/c Neuro: alert to name, follows commands   Data Reviewed: I have personally reviewed following  labs and imaging studies.  CBC: Recent Labs  Lab 05/02/19 0311 05/03/19 0220 05/06/19 0307 05/07/19 0346  WBC 6.8 7.2 8.8 6.6  NEUTROABS  --   --   --  3.8  HGB 13.9 13.4 13.7 13.7  HCT 42.0 40.6 41.5 41.1  MCV 84.8 85.5 86.8 85.3  PLT 206 206 194 0000000   Basic Metabolic Panel: Recent Labs  Lab 05/02/19 0311 05/03/19 0220 05/04/19 0650 05/06/19 0307 05/07/19 0346 05/08/19 0423  NA 134* 136 136 137 138  --   K 3.4* 3.4* 3.6 3.8 4.2  --   CL 100 103 102 104 104  --   CO2 23 24 22  21* 24  --   GLUCOSE 106* 107* 113* 73 97  --   BUN 20 23* 16 31* 28*  --   CREATININE 0.62 0.55 0.48 0.74 0.69 0.61  CALCIUM 9.1 8.9 9.1 9.1 9.0  --   MG  --   --  1.6*  --  1.8  --   PHOS  --   --   --   --  4.8*  --    GFR: Estimated Creatinine Clearance: 77.7 mL/min (by C-G formula based on SCr of 0.61 mg/dL). Liver Function Tests: Recent Labs  Lab 05/07/19 0346  ALBUMIN 3.0*   No results for input(s): LIPASE, AMYLASE in the last 168 hours. No results for input(s): AMMONIA in the last 168 hours. Coagulation Profile: No results for input(s): INR, PROTIME in the last 168 hours. Cardiac Enzymes: No results for input(s): CKTOTAL, CKMB, CKMBINDEX, TROPONINI in the last 168 hours. BNP (last 3 results) No results for input(s): PROBNP in the last 8760 hours. HbA1C: No results for input(s): HGBA1C in the last 72 hours. CBG: Recent Labs  Lab 05/07/19 1205 05/07/19 1639 05/07/19 2117 05/08/19 0636 05/08/19 1127  GLUCAP 91 190* 87 143* 115*   Lipid Profile: No results for input(s): CHOL, HDL, LDLCALC, TRIG, CHOLHDL, LDLDIRECT in the last 72 hours. Thyroid Function Tests: No results for input(s): TSH, T4TOTAL, FREET4, T3FREE, THYROIDAB in the last 72 hours. Anemia Panel: No results for input(s): VITAMINB12, FOLATE, FERRITIN, TIBC, IRON, RETICCTPCT in the last 72 hours. Sepsis Labs: No results for input(s): PROCALCITON, LATICACIDVEN in the last 168 hours.  Recent Results (from the  past 240 hour(s))  SARS CORONAVIRUS 2 (TAT 6-24 HRS) Nasopharyngeal Nasopharyngeal Swab     Status: None   Collection Time: 05/01/19  1:47 PM   Specimen: Nasopharyngeal Swab  Result Value Ref Range Status   SARS Coronavirus 2 NEGATIVE NEGATIVE Final    Comment: (Thomas) SARS-CoV-2 target nucleic acids are NOT DETECTED. The SARS-CoV-2 RNA is generally detectable in upper and lower respiratory specimens during the acute phase of infection. Negative results do not preclude SARS-CoV-2 infection, do not rule out co-infections with other pathogens, and should not be used as the sole basis for treatment or other patient management decisions. Negative results must be combined with clinical observations, patient history, and epidemiological information. The expected result is Negative. Fact Sheet for Patients: SugarRoll.be Fact Sheet for Healthcare Providers: https://www.woods-mathews.com/ This test is not yet approved or cleared by the [place] FDA and  has been authorized for detection and/or diagnosis of SARS-CoV-2 by FDA under an Emergency Use Authorization (  EUA). This EUA will remain  in effect (meaning this test can be used) for the duration of the COVID-19 declaration under Section 56 4(b)(1) of the Act, 21 U.S.C. section 360bbb-3(b)(1), unless the authorization is terminated or revoked sooner. Performed at [facility], [place] [address], [place], [place] [address]       Radiology Studies: No results found.   Scheduled Meds: . aspirin EC  81 mg Oral Daily  . citalopram  20 mg Oral QHS  . clonazePAM  1 mg Oral BID  . docusate sodium  100 mg Oral BID  . enoxaparin (LOVENOX) injection  40 mg Subcutaneous Q24H  . feeding supplement (ENSURE ENLIVE)  237 mL Oral Q24H  . feeding supplement (PRO-STAT SUGAR FREE 64)  30 mL Oral BID  . fluticasone  2 spray Each Nare BID  . fluticasone furoate-vilanterol  1 puff Inhalation Daily  .  gabapentin  100 mg Oral BID  . guaiFENesin  1,200 mg Oral Daily  . lisinopril  40 mg Oral Daily  . loratadine  10 mg Oral Daily  . lurasidone  20 mg Oral BID  . Melatonin  6 mg Oral QHS  . metoprolol tartrate  25 mg Oral BID  . NIFEdipine  90 mg Oral Daily  . OLANZapine  5 mg Oral BID  . pantoprazole  40 mg Oral QHS  . polyethylene glycol  17 g Oral Daily  . sodium chloride  1 g Oral TID WC  . valproic acid  500 mg Oral BID   Continuous Infusions:   LOS: 6 days    Time spent: 25 minutes spent in the coordination of care today.    Jonnie Finner, DO [facility] Pager [phone]  If 7PM-7AM, please contact night-coverage [email or web address] Password [email or web address] 05/08/2019, 2:55 PM

## 2019-05-09 DIAGNOSIS — E871 Hypo-osmolality and hyponatremia: Secondary | ICD-10-CM

## 2019-05-09 DIAGNOSIS — E785 Hyperlipidemia, unspecified: Secondary | ICD-10-CM

## 2019-05-09 LAB — CBC WITH DIFFERENTIAL/PLATELET
Abs Immature Granulocytes: 0.03 10*3/uL (ref 0.00–0.07)
Basophils Absolute: 0 10*3/uL (ref 0.0–0.1)
Basophils Relative: 0 %
Eosinophils Absolute: 0.1 10*3/uL (ref 0.0–0.5)
Eosinophils Relative: 1 %
HCT: 43.3 % (ref 36.0–46.0)
Hemoglobin: 14 g/dL (ref 12.0–15.0)
Immature Granulocytes: 0 %
Lymphocytes Relative: 28 %
Lymphs Abs: 1.9 10*3/uL (ref 0.7–4.0)
MCH: 27.9 pg (ref 26.0–34.0)
MCHC: 32.3 g/dL (ref 30.0–36.0)
MCV: 86.4 fL (ref 80.0–100.0)
Monocytes Absolute: 0.7 10*3/uL (ref 0.1–1.0)
Monocytes Relative: 10 %
Neutro Abs: 4.1 10*3/uL (ref 1.7–7.7)
Neutrophils Relative %: 61 %
Platelets: 186 10*3/uL (ref 150–400)
RBC: 5.01 MIL/uL (ref 3.87–5.11)
RDW: 14 % (ref 11.5–15.5)
WBC: 6.7 10*3/uL (ref 4.0–10.5)
nRBC: 0 % (ref 0.0–0.2)

## 2019-05-09 LAB — GLUCOSE, CAPILLARY
Glucose-Capillary: 116 mg/dL — ABNORMAL HIGH (ref 70–99)
Glucose-Capillary: 165 mg/dL — ABNORMAL HIGH (ref 70–99)
Glucose-Capillary: 79 mg/dL (ref 70–99)
Glucose-Capillary: 120 mg/dL — ABNORMAL HIGH (ref 70–99)

## 2019-05-09 LAB — RENAL FUNCTION PANEL
Albumin: 3 g/dL — ABNORMAL LOW (ref 3.5–5.0)
Anion gap: 10 (ref 5–15)
BUN: 17 mg/dL (ref 6–20)
CO2: 25 mmol/L (ref 22–32)
Calcium: 9.4 mg/dL (ref 8.9–10.3)
Chloride: 98 mmol/L (ref 98–111)
Creatinine, Ser: 0.6 mg/dL (ref 0.44–1.00)
GFR calc Af Amer: >60 mL/min (ref >60)
GFR calc non Af Amer: >60 mL/min (ref >60)
Glucose, Bld: 123 mg/dL — ABNORMAL HIGH (ref 70–99)
Phosphorus: 5.1 mg/dL — ABNORMAL HIGH (ref 2.5–4.6)
Potassium: 4.8 mmol/L (ref 3.5–5.1)
Sodium: 133 mmol/L — ABNORMAL LOW (ref 135–145)

## 2019-05-09 LAB — MAGNESIUM: Magnesium: 1.8 mg/dL (ref 1.7–2.4)

## 2019-05-09 NOTE — Progress Notes (Signed)
Marland Kitchen  PROGRESS NOTE    Natalie Thomas  [id number] DOB: [birth year] DOA: 05/01/2019 PCP: System, Pcp Not In   Brief Narrative:   [name] an 58 y.o.femalewith medical history significant ofbipolar/schizoaffective d/o; HTN; HLD: DM; COPD; chronic hyponatremia; and CHF presenting with difficulty talking. At baseline at the time of last discharge, she was calm and comfortable, "pleasantly confused, nonsensical speech."   11/11: Angry this morning. Yelling at everyone. 11/12: Sleeping this AM. Reduced outbursts per nursing. 11/13: Calmer this AM. Agreeable to examination. Working on SNF. 11/14: Calm, eating in bed this AM. No outbursts noted by nursing.    Assessment & Plan:   Principal Problem:   Hypertensive crisis Active Problems:   DM (diabetes mellitus), type 2 (HCC)   COPD (chronic obstructive pulmonary disease) (HCC)   Schizoaffective disorder, bipolar type (HCC)   Hyperlipidemia   Chronic diastolic CHF (congestive heart failure) (HCC)  HTN crisis with Abnormal EKG - Pt presented w/ AMS and speech dysfunction; ? if different from baseline - BP is currently at goal. - continue lisinopril 40 mg daily, Lopressor 25 mg BID, nifedipine 90 mg daily, PRN hydralazine  Schizoaffective disorder/bipolar type Anxiety - Seen by psych; recs below - Schizoaffective d/o; bipolar type - Recommend tapering off of Latuda from 40 mg BID to 20 mg BID - Start Zyprexa 5 mg BID - Discontinue Wellbutrin 150 mg daily (increases anxiety) - Start Celexa 20 mg daily at bedtime, assists with sleep also  - Anxiety: - Recommend increasing gabapentin 100 mg daily to BID - Recommend discontinuing Xanax 0.5 mg TID - Recommend Klonopin 0.5 mg BID (longer acting) - Psych recommendations have been applied. - agitated this AM; yelling at everyone, not cooperative  with exam; psyc re-paged; unable to get in contact with psyc - klonopin increased to 1mg  BID; continue, she is calmer  Acute metabolic encephalopathy in the setting of mental illness - Management as above - EEG no seizure seen. - One-to-one on 05/04/2019 for patient's own safety - Continue fall precautions  COPD - continue Combivent, Breo Ellipta, Albuterol - no acute needs; monitor  HLD - no home meds; follow up with PCP  Chronic diastolic CHF - Appears to be compensated - 12/04/18 echo with preserved EF and "impaired" relaxation - continue metoprolol, lisinopril     - blood pressures are acceptable  Obesity - Body mass index is 31.24 kg/m - Recommend weight loss outpatient with regular physical activity activity and healthy dieting.  Physical debility - PT/OT: SNF, 24-hr supervision - continue PT/OT with fall precautions  Chronic hyponatremia     - continue salt tablets  Awaiting [facility] for d/c to SNF. Continue as above.  DVT prophylaxis: lovenox Code Status: FULL   Disposition Plan: TBD  Consultants:   Neurology  Psychiatry  ROS:  Denies CP, dyspnea . Remainder 10-pt ROS is negative for all not previously mentioned.  Subjective: "I want to get back on my right meds."  Objective: Vitals:   05/08/19 2000 05/08/19 2344 05/09/19 0351 05/09/19 0729  BP: (!) 149/95 (!) 154/81 121/75   Pulse: 68 71 65   Resp: 18 18 18    Temp: 97.7 F (36.5 C) 98.2 F (36.8 C) 98.3 F (36.8 C)   TempSrc: Axillary Oral Oral   SpO2: 93% 98% 93% 98%  Weight:      Height:        Intake/Output Summary (Last 24 hours) at 05/09/2019 0744 Last data filed at 05/08/2019 2344 Gross per 24 hour  Intake 1440 ml  Output 2000 ml  Net -560 ml   Filed Weights   05/01/19 1100 05/01/19 1437  Weight: 80 kg 80 kg    Examination:  General: 58 y.o. female resting in bed in NAD Cardiovascular: RRR, +S1, S2, no m/g/r  Respiratory: CTABL, no w/r/r GI: BS+, NDNT, soft MSK: No e/c/c Neuro: alert to name, follows commands Psyc: Appropriate interaction but flat affect, calm/cooperative   Data Reviewed: I have personally reviewed following labs and imaging studies.  CBC: Recent Labs  Lab 05/03/19 0220 05/06/19 0307 05/07/19 0346 05/09/19 0319  WBC 7.2 8.8 6.6 6.7  NEUTROABS  --   --  3.8 4.1  HGB 13.4 13.7 13.7 14.0  HCT 40.6 41.5 41.1 43.3  MCV 85.5 86.8 85.3 86.4  PLT 206 194 188 [id number]   Basic Metabolic Panel: Recent Labs  Lab 05/03/19 0220 05/04/19 0650 05/06/19 0307 05/07/19 0346 05/08/19 0423 05/09/19 0319  NA 136 136 137 138  --  133*  K 3.4* 3.6 3.8 4.2  --  4.8  CL 103 102 104 104  --  98  CO2 24 22 21* 24  --  25  GLUCOSE 107* 113* 73 97  --  123*  BUN 23* 16 31* 28*  --  17  CREATININE 0.55 0.48 0.74 0.69 0.61 0.60  CALCIUM 8.9 9.1 9.1 9.0  --  9.4  MG  --  1.6*  --  1.8  --  1.8  PHOS  --   --   --  4.8*  --  5.1*   GFR: Estimated Creatinine Clearance: 77.7 mL/min (by C-G formula based on SCr of 0.6 mg/dL). Liver Function Tests: Recent Labs  Lab 05/07/19 0346 05/09/19 0319  ALBUMIN 3.0* 3.0*   No results for input(s): LIPASE, AMYLASE in the last 168 hours. No results for input(s): AMMONIA in the last 168 hours. Coagulation Profile: No results for input(s): INR, PROTIME in the last 168 hours. Cardiac Enzymes: No results for input(s): CKTOTAL, CKMB, CKMBINDEX, TROPONINI in the last 168 hours. BNP (last 3 results) No results for input(s): PROBNP in the last 8760 hours. HbA1C: No results for input(s): HGBA1C in the last 72 hours. CBG: Recent Labs  Lab 05/08/19 0636 05/08/19 1127 05/08/19 1633 05/08/19 2127 05/09/19 0608  GLUCAP 143* 115* 136* 179* 116*   Lipid Profile: No results for input(s): CHOL, HDL, LDLCALC, TRIG, CHOLHDL, LDLDIRECT in the last 72 hours. Thyroid Function Tests: No results for input(s): TSH, T4TOTAL, FREET4, T3FREE, THYROIDAB in the  last 72 hours. Anemia Panel: No results for input(s): VITAMINB12, FOLATE, FERRITIN, TIBC, IRON, RETICCTPCT in the last 72 hours. Sepsis Labs: No results for input(s): PROCALCITON, LATICACIDVEN in the last 168 hours.  Recent Results (from the past 240 hour(s))  SARS CORONAVIRUS 2 (TAT 6-24 HRS) Nasopharyngeal Nasopharyngeal Swab     Status: None   Collection Time: 05/01/19  1:47 PM   Specimen: Nasopharyngeal Swab  Result Value Ref Range Status   SARS Coronavirus 2 NEGATIVE NEGATIVE Final    Comment: (NOTE) SARS-CoV-2 target nucleic acids are NOT DETECTED. The SARS-CoV-2 RNA is generally detectable in upper and lower respiratory specimens during the acute phase of infection. Negative results do not preclude SARS-CoV-2 infection, do not rule out co-infections with other pathogens, and should not be used as the sole basis for treatment or other patient management decisions. Negative results must be combined with clinical observations, patient history, and epidemiological information. The expected result is Negative. Fact Sheet for Patients: SugarRoll.be  Fact Sheet for Healthcare Providers: https://www.woods-mathews.com/ This test is not yet approved or cleared by the [place] FDA and  has been authorized for detection and/or diagnosis of SARS-CoV-2 by FDA under an Emergency Use Authorization (EUA). This EUA will remain  in effect (meaning this test can be used) for the duration of the COVID-19 declaration under Section 56 4(b)(1) of the Act, 21 U.S.C. section 360bbb-3(b)(1), unless the authorization is terminated or revoked sooner. Performed at [facility], [place] [address] [place], [place] [address]       Radiology Studies: No results found.   Scheduled Meds: . aspirin EC  81 mg Oral Daily  . citalopram  20 mg Oral QHS  . clonazePAM  1 mg Oral BID  . docusate sodium  100 mg Oral BID  . enoxaparin (LOVENOX) injection  40  mg Subcutaneous Q24H  . feeding supplement (ENSURE ENLIVE)  237 mL Oral Q24H  . feeding supplement (PRO-STAT SUGAR FREE 64)  30 mL Oral BID  . fluticasone  2 spray Each Nare BID  . fluticasone furoate-vilanterol  1 puff Inhalation Daily  . gabapentin  100 mg Oral BID  . guaiFENesin  1,200 mg Oral Daily  . lisinopril  40 mg Oral Daily  . loratadine  10 mg Oral Daily  . lurasidone  20 mg Oral BID  . Melatonin  6 mg Oral QHS  . metoprolol tartrate  25 mg Oral BID  . NIFEdipine  90 mg Oral Daily  . OLANZapine  5 mg Oral BID  . pantoprazole  40 mg Oral QHS  . polyethylene glycol  17 g Oral Daily  . sodium chloride  1 g Oral TID WC  . valproic acid  500 mg Oral BID   Continuous Infusions:   LOS: 7 days    Time spent: 25 minutes spent in the coordination of care today.    Jonnie Finner, DO [facility] Pager [phone]  If 7PM-7AM, please contact night-coverage [email or web address] Password [facility] 05/09/2019, 7:44 AM

## 2019-05-09 NOTE — Progress Notes (Signed)
Pt screaming at staff for drinks, to be changed, and that she is being neglected. Staff has brought her multiple drinks, changed linens multiple times, and answered all call lights. Dr. Marylyn Ishihara notified. No new orders at this time. [place]

## 2019-05-10 LAB — GLUCOSE, CAPILLARY
Glucose-Capillary: 129 mg/dL — ABNORMAL HIGH (ref 70–99)
Glucose-Capillary: 160 mg/dL — ABNORMAL HIGH (ref 70–99)
Glucose-Capillary: 110 mg/dL — ABNORMAL HIGH (ref 70–99)
Glucose-Capillary: 126 mg/dL — ABNORMAL HIGH (ref 70–99)

## 2019-05-10 NOTE — Progress Notes (Signed)
Natalie Thomas Kitchen  PROGRESS NOTE    Natalie Thomas  [id number] DOB: 07/12/1960 DOA: 05/01/2019 PCP: System, Pcp Not In   Brief Narrative:   Natalie Thomas an 58 y.o.femalewith medical history significant ofbipolar/schizoaffective d/o; HTN; HLD: DM; COPD; chronic hyponatremia; and CHF presenting with difficulty talking. At baseline at the time of last discharge, she was calm and comfortable, "pleasantly confused, nonsensical speech."   11/15: Nursing reports constant requests for diet coke.   Assessment & Plan:   Principal Problem:   Hypertensive crisis Active Problems:   DM (diabetes mellitus), type 2 (HCC)   COPD (chronic obstructive pulmonary disease) (HCC)   Schizoaffective disorder, bipolar type (HCC)   Hyperlipidemia   Chronic diastolic CHF (congestive heart failure) (HCC)  HTN crisis with Abnormal EKG - Pt presented w/ AMS and speech dysfunction; ? if different from baseline - BP is currently at goal. - continue lisinopril 40 mg daily, Lopressor 25 mg BID, nifedipine 90 mg daily, PRN hydralazine  Schizoaffective disorder/bipolar type Anxiety - Seen by psych; recs below - Schizoaffective d/o; bipolar type - Recommend tapering off of Latuda from 40 mg BID to 20 mg BID - Start Zyprexa 5 mg BID - Discontinue Wellbutrin 150 mg daily (increases anxiety) - Start Celexa 20 mg daily at bedtime, assists with sleep also  - Anxiety: - Recommend increasing gabapentin 100 mg daily to BID - Recommend discontinuing Xanax 0.5 mg TID - Recommend Klonopin 0.5 mg BID (longer acting) - Psych recommendations have been applied. - agitated this AM; yelling at everyone, not cooperative with exam; psyc re-paged; unable to get in contact with psyc - klonopin increased to 1mg  BID; stable, continue  Acute metabolic encephalopathy in the setting of mental illness  - Management as above - EEG no seizure seen. - One-to-one on 05/04/2019 for patient's own safety - Continue fall precautions  COPD - continue Combivent, Breo Ellipta, Albuterol - no acute needs; monitor  HLD - no home meds; follow up with PCP  Chronic diastolic CHF - Appears to be compensated - 12/04/18 echo with preserved EF and "impaired" relaxation - continue metoprolol, lisinopril     - BP is fine this AM  Obesity - Body mass index is 31.24 kg/m - Recommend weight loss outpatient with regular physical activity activity and healthy dieting.  Physical debility - PT/OT: SNF, 24-hr supervision - continue PT/OT with fall precautions  Chronic hyponatremia     - continue salt tablets  Has a pending [facility]. Awaiting complete. Hopeful for placement soon.   DVT prophylaxis: lovenox Code Status: FULL   Disposition Plan: TBD  Consultants:   Neurology  Psychiatry  ROS:  Reports thirst. Denies CP, N, V. Remainder 10-pt ROS is negative for all not previously mentioned.  Subjective: "Can I get a coke?"  Objective: Vitals:   05/09/19 2021 05/09/19 2329 05/10/19 0629 05/10/19 0745  BP: 96/71 129/69 (!) 157/90 113/75  Pulse: 65 68 90 (!) 109  Resp: 18 18 18 18   Temp: 97.8 F (36.6 C) 98 F (36.7 C) 98.4 F (36.9 C) 97.9 F (36.6 C)  TempSrc: Axillary Oral Oral Oral  SpO2: 93% 96% 95% 94%  Weight:      Height:        Intake/Output Summary (Last 24 hours) at 05/10/2019 0747 Last data filed at 05/09/2019 2330 Gross per 24 hour  Intake 970 ml  Output 2225 ml  Net -1255 ml   Filed Weights   05/01/19 1100 05/01/19 1437  Weight: 80 kg 80 kg  Examination:  General: 58 y.o. female resting in bed in NAD Cardiovascular: RRR, +S1, S2, no m/g/r, equal pulses throughout Respiratory: CTABL, no w/r/r, normal WOB GI: BS+, NDNT, no masses noted, no organomegaly noted MSK: No e/c/c Neuro: alert to name,  follows commands Psyc: calm/cooperative   Data Reviewed: I have personally reviewed following labs and imaging studies.  CBC: Recent Labs  Lab 05/06/19 0307 05/07/19 0346 05/09/19 0319  WBC 8.8 6.6 6.7  NEUTROABS  --  3.8 4.1  HGB 13.7 13.7 14.0  HCT 41.5 41.1 43.3  MCV 86.8 85.3 86.4  PLT 194 188 [id number]   Basic Metabolic Panel: Recent Labs  Lab 05/04/19 0650 05/06/19 0307 05/07/19 0346 05/08/19 0423 05/09/19 0319  NA 136 137 138  --  133*  K 3.6 3.8 4.2  --  4.8  CL 102 104 104  --  98  CO2 22 21* 24  --  25  GLUCOSE 113* 73 97  --  123*  BUN 16 31* 28*  --  17  CREATININE 0.48 0.74 0.69 0.61 0.60  CALCIUM 9.1 9.1 9.0  --  9.4  MG 1.6*  --  1.8  --  1.8  PHOS  --   --  4.8*  --  5.1*   GFR: Estimated Creatinine Clearance: 77.7 mL/min (by C-G formula based on SCr of 0.6 mg/dL). Liver Function Tests: Recent Labs  Lab 05/07/19 0346 05/09/19 0319  ALBUMIN 3.0* 3.0*   No results for input(s): LIPASE, AMYLASE in the last 168 hours. No results for input(s): AMMONIA in the last 168 hours. Coagulation Profile: No results for input(s): INR, PROTIME in the last 168 hours. Cardiac Enzymes: No results for input(s): CKTOTAL, CKMB, CKMBINDEX, TROPONINI in the last 168 hours. BNP (last 3 results) No results for input(s): PROBNP in the last 8760 hours. HbA1C: No results for input(s): HGBA1C in the last 72 hours. CBG: Recent Labs  Lab 05/09/19 0608 05/09/19 1120 05/09/19 1702 05/09/19 2117 05/10/19 0628  GLUCAP 116* 165* 79 120* 129*   Lipid Profile: No results for input(s): CHOL, HDL, LDLCALC, TRIG, CHOLHDL, LDLDIRECT in the last 72 hours. Thyroid Function Tests: No results for input(s): TSH, T4TOTAL, FREET4, T3FREE, THYROIDAB in the last 72 hours. Anemia Panel: No results for input(s): VITAMINB12, FOLATE, FERRITIN, TIBC, IRON, RETICCTPCT in the last 72 hours. Sepsis Labs: No results for input(s): PROCALCITON, LATICACIDVEN in the last 168 hours.  Recent  Results (from the past 240 hour(s))  SARS CORONAVIRUS 2 (TAT 6-24 HRS) Nasopharyngeal Nasopharyngeal Swab     Status: None   Collection Time: 05/01/19  1:47 PM   Specimen: Nasopharyngeal Swab  Result Value Ref Range Status   SARS Coronavirus 2 NEGATIVE NEGATIVE Final    Comment: (NOTE) SARS-CoV-2 target nucleic acids are NOT DETECTED. The SARS-CoV-2 RNA is generally detectable in upper and lower respiratory specimens during the acute phase of infection. Negative results do not preclude SARS-CoV-2 infection, do not rule out co-infections with other pathogens, and should not be used as the sole basis for treatment or other patient management decisions. Negative results must be combined with clinical observations, patient history, and epidemiological information. The expected result is Negative. Fact Sheet for Patients: SugarRoll.be Fact Sheet for Healthcare Providers: https://www.woods-mathews.com/ This test is not yet approved or cleared by the [place] FDA and  has been authorized for detection and/or diagnosis of SARS-CoV-2 by FDA under an Emergency Use Authorization (EUA). This EUA will remain  in effect (meaning this test can be used)  for the duration of the COVID-19 declaration under Section 56 4(b)(1) of the Act, 21 U.S.C. section 360bbb-3(b)(1), unless the authorization is terminated or revoked sooner. Performed at [facility], [place] [address], [place], [place] [address]       Radiology Studies: No results found.   Scheduled Meds: . aspirin EC  81 mg Oral Daily  . citalopram  20 mg Oral QHS  . clonazePAM  1 mg Oral BID  . docusate sodium  100 mg Oral BID  . enoxaparin (LOVENOX) injection  40 mg Subcutaneous Q24H  . feeding supplement (ENSURE ENLIVE)  237 mL Oral Q24H  . feeding supplement (PRO-STAT SUGAR FREE 64)  30 mL Oral BID  . fluticasone  2 spray Each Nare BID  . fluticasone furoate-vilanterol  1 puff  Inhalation Daily  . gabapentin  100 mg Oral BID  . guaiFENesin  1,200 mg Oral Daily  . lisinopril  40 mg Oral Daily  . loratadine  10 mg Oral Daily  . lurasidone  20 mg Oral BID  . Melatonin  6 mg Oral QHS  . metoprolol tartrate  25 mg Oral BID  . NIFEdipine  90 mg Oral Daily  . OLANZapine  5 mg Oral BID  . pantoprazole  40 mg Oral QHS  . polyethylene glycol  17 g Oral Daily  . sodium chloride  1 g Oral TID WC  . valproic acid  500 mg Oral BID   Continuous Infusions:   LOS: 8 days    Time spent: 25 minutes spent in the coordination of care today.    Jonnie Finner, DO [facility] Pager [phone]  If 7PM-7AM, please contact night-coverage [email or web address] Password [facility] 05/10/2019, 7:47 AM

## 2019-05-11 LAB — GLUCOSE, CAPILLARY
Glucose-Capillary: 85 mg/dL (ref 70–99)
Glucose-Capillary: 96 mg/dL (ref 70–99)
Glucose-Capillary: 96 mg/dL (ref 70–99)
Glucose-Capillary: 123 mg/dL — ABNORMAL HIGH (ref 70–99)
Glucose-Capillary: 131 mg/dL — ABNORMAL HIGH (ref 70–99)

## 2019-05-11 NOTE — Progress Notes (Signed)
Natalie Thomas  PROGRESS NOTE    Natalie Thomas  [id number] DOB: 06-Mar-1961 DOA: 05/01/2019 PCP: System, Pcp Not In   Brief Narrative:   Natalie Thomas an 58 y.o.femalewith medical history significant ofbipolar/schizoaffective d/o; HTN; HLD: DM; COPD; chronic hyponatremia; and CHF presenting with difficulty talking. At baseline at the time of last discharge, she was calm and comfortable, "pleasantly confused, nonsensical speech."   11/15: Nursing reports constant requests for diet coke. 11/16: Awaiting [facility] review. No acute events.    Assessment & Plan:   Principal Problem:   Hypertensive crisis Active Problems:   DM (diabetes mellitus), type 2 (HCC)   COPD (chronic obstructive pulmonary disease) (HCC)   Schizoaffective disorder, bipolar type (HCC)   Hyperlipidemia   Chronic diastolic CHF (congestive heart failure) (HCC)  HTN crisis with Abnormal EKG - Pt presented w/ AMS and speech dysfunction; ? if different from baseline - BP is currently at goal. - continue lisinopril 40 mg daily, Lopressor 25 mg BID, nifedipine 90 mg daily, PRN hydralazine  Schizoaffective disorder/bipolar type Anxiety - Seen by psych; recs below - Schizoaffective d/o; bipolar type - Recommend tapering off of Latuda from 40 mg BID to 20 mg BID - Start Zyprexa 5 mg BID - Discontinue Wellbutrin 150 mg daily (increases anxiety) - Start Celexa 20 mg daily at bedtime, assists with sleep also  - Anxiety: - Recommend increasing gabapentin 100 mg daily to BID - Recommend discontinuing Xanax 0.5 mg TID - Recommend Klonopin 0.5 mg BID (longer acting) - Psych recommendations have been applied. - agitated this AM; yelling at everyone, not cooperative with exam; psyc re-paged; unable to get in contact with psyc - klonopin increased to 1mg  BID; stable, continue     - outbursts are  better per nursing  Acute metabolic encephalopathy in the setting of mental illness - Management as above - EEG no seizure seen. - One-to-one on 05/04/2019 for patient's own safety - Continue fall precautions  COPD - continue Combivent, Breo Ellipta, Albuterol - comfortable on RA, no changes for today  HLD - no home meds; follow up with PCP  Chronic diastolic CHF - Appears to be compensated - 12/04/18 echo with preserved EF and "impaired" relaxation - continue metoprolol, lisinopril - BP ok today  Obesity - Body mass index is 31.24 kg/m - Recommend weight loss outpatient with regular physical activity activity and healthy dieting.  Physical debility - PT/OT: SNF, 24-hr supervision - continue PT/OT with fall precautions     - working on SNF option  Chronic hyponatremia - continue salt tablets     - periodic lab checks  [facility] interview today? Awaiting placement.   DVT prophylaxis: lovenox Code Status: FULL   Disposition Plan: TBD  Consultants:   Neurology  Psychiatry  Subjective: "When can I get a coke?"  Objective: Vitals:   05/10/19 1644 05/10/19 1952 05/10/19 2323 05/11/19 0335  BP: 103/67 (!) 110/6 130/87 (!) 147/96  Pulse: 71 64 69 69  Resp: 16 18 18 19   Temp: 98.5 F (36.9 C) 98.2 F (36.8 C) 98.4 F (36.9 C) 98.5 F (36.9 C)  TempSrc: Oral Oral Oral   SpO2: 96% 96% 96% 92%  Weight:      Height:        Intake/Output Summary (Last 24 hours) at 05/11/2019 0740 Last data filed at 05/10/2019 1906 Gross per 24 hour  Intake 600 ml  Output 1760 ml  Net -1160 ml   Filed Weights   05/01/19 1100 05/01/19 1437  Weight: 80 kg 80 kg    Examination:  General: 58 y.o. female resting in bed in NAD Cardiovascular: +S1, s2, no m/g/r, reg RR Respiratory: clear, normal WOB GI: BS+, NDNT MSK: No e/c/c Neuro: alert to name, follows some commands   Data Reviewed: I have  personally reviewed following labs and imaging studies.  CBC: Recent Labs  Lab 05/06/19 0307 05/07/19 0346 05/09/19 0319  WBC 8.8 6.6 6.7  NEUTROABS  --  3.8 4.1  HGB 13.7 13.7 14.0  HCT 41.5 41.1 43.3  MCV 86.8 85.3 86.4  PLT 194 188 [id number]   Basic Metabolic Panel: Recent Labs  Lab 05/06/19 0307 05/07/19 0346 05/08/19 0423 05/09/19 0319  NA 137 138  --  133*  K 3.8 4.2  --  4.8  CL 104 104  --  98  CO2 21* 24  --  25  GLUCOSE 73 97  --  123*  BUN 31* 28*  --  17  CREATININE 0.74 0.69 0.61 0.60  CALCIUM 9.1 9.0  --  9.4  MG  --  1.8  --  1.8  PHOS  --  4.8*  --  5.1*   GFR: Estimated Creatinine Clearance: 77.7 mL/min (by C-G formula based on SCr of 0.6 mg/dL). Liver Function Tests: Recent Labs  Lab 05/07/19 0346 05/09/19 0319  ALBUMIN 3.0* 3.0*   No results for input(s): LIPASE, AMYLASE in the last 168 hours. No results for input(s): AMMONIA in the last 168 hours. Coagulation Profile: No results for input(s): INR, PROTIME in the last 168 hours. Cardiac Enzymes: No results for input(s): CKTOTAL, CKMB, CKMBINDEX, TROPONINI in the last 168 hours. BNP (last 3 results) No results for input(s): PROBNP in the last 8760 hours. HbA1C: No results for input(s): HGBA1C in the last 72 hours. CBG: Recent Labs  Lab 05/10/19 0628 05/10/19 1132 05/10/19 1628 05/10/19 2115 05/11/19 0612  GLUCAP 129* 160* 110* 126* 85   Lipid Profile: No results for input(s): CHOL, HDL, LDLCALC, TRIG, CHOLHDL, LDLDIRECT in the last 72 hours. Thyroid Function Tests: No results for input(s): TSH, T4TOTAL, FREET4, T3FREE, THYROIDAB in the last 72 hours. Anemia Panel: No results for input(s): VITAMINB12, FOLATE, FERRITIN, TIBC, IRON, RETICCTPCT in the last 72 hours. Sepsis Labs: No results for input(s): PROCALCITON, LATICACIDVEN in the last 168 hours.  Recent Results (from the past 240 hour(s))  SARS CORONAVIRUS 2 (TAT 6-24 HRS) Nasopharyngeal Nasopharyngeal Swab     Status: None    Collection Time: 05/01/19  1:47 PM   Specimen: Nasopharyngeal Swab  Result Value Ref Range Status   SARS Coronavirus 2 NEGATIVE NEGATIVE Final    Comment: (NOTE) SARS-CoV-2 target nucleic acids are NOT DETECTED. The SARS-CoV-2 RNA is generally detectable in upper and lower respiratory specimens during the acute phase of infection. Negative results do not preclude SARS-CoV-2 infection, do not rule out co-infections with other pathogens, and should not be used as the sole basis for treatment or other patient management decisions. Negative results must be combined with clinical observations, patient history, and epidemiological information. The expected result is Negative. Fact Sheet for Patients: SugarRoll.be Fact Sheet for Healthcare Providers: https://www.woods-mathews.com/ This test is not yet approved or cleared by the [place] FDA and  has been authorized for detection and/or diagnosis of SARS-CoV-2 by FDA under an Emergency Use Authorization (EUA). This EUA will remain  in effect (meaning this test can be used) for the duration of the COVID-19 declaration under Section 56 4(b)(1) of the Act, 21 U.S.C. section  360bbb-3(b)(1), unless the authorization is terminated or revoked sooner. Performed at [facility], [place] [address] [place], [place] [address]       Radiology Studies: No results found.   Scheduled Meds: . aspirin EC  81 mg Oral Daily  . citalopram  20 mg Oral QHS  . clonazePAM  1 mg Oral BID  . docusate sodium  100 mg Oral BID  . enoxaparin (LOVENOX) injection  40 mg Subcutaneous Q24H  . feeding supplement (ENSURE ENLIVE)  237 mL Oral Q24H  . feeding supplement (PRO-STAT SUGAR FREE 64)  30 mL Oral BID  . fluticasone  2 spray Each Nare BID  . fluticasone furoate-vilanterol  1 puff Inhalation Daily  . gabapentin  100 mg Oral BID  . guaiFENesin  1,200 mg Oral Daily  . lisinopril  40 mg Oral Daily  . loratadine   10 mg Oral Daily  . lurasidone  20 mg Oral BID  . Melatonin  6 mg Oral QHS  . metoprolol tartrate  25 mg Oral BID  . NIFEdipine  90 mg Oral Daily  . OLANZapine  5 mg Oral BID  . pantoprazole  40 mg Oral QHS  . polyethylene glycol  17 g Oral Daily  . sodium chloride  1 g Oral TID WC  . valproic acid  500 mg Oral BID   Continuous Infusions:   LOS: 9 days    Time spent: 15 minutes spent in the coordination of care today.    Jonnie Finner, DO [facility] Pager [phone]  If 7PM-7AM, please contact night-coverage [email or web address] Password [email or web address] 05/11/2019, 7:40 AM

## 2019-05-11 NOTE — Progress Notes (Signed)
Physical Therapy Treatment Patient Details Name: TANGANIKA MEINEKE MRN: [id number] DOB: 1961-04-22 Today's Date: 05/11/2019    History of Present Illness DORINE NEELD is an 58 y.o. female with medical history significant of bipolar/schizoaffective d/o;  HTN; HLD: DM; COPD; chronic hyponatremia; and CHF presenting with difficulty talking.  Initially code stroke called but MRI neg. Psych has made new med recs. HTN crisis upon arrival.     PT Comments    Pt very confused throughout session, requiring encouragement for participation & pt willing to get out of bed 2/2 bowel & bladder incontinence. Pt able to perform peri hygiene while standing at EOB with supervision & transfer to recliner with supervision. Pt would benefit from gait attempts but pt unwilling this session. Pt will require 24 hr supervision/assist at d/c 2/2 significant cognitive deficits & physical deficits. Will continue to follow acutely to increase independence with functional mobility.     Follow Up Recommendations  SNF;Supervision/Assistance - 24 hour     Equipment Recommendations  (TBD in next venue)    Recommendations for Other Services       Precautions / Restrictions Precautions Precautions: Fall Restrictions Weight Bearing Restrictions: No    Mobility  Bed Mobility Overal bed mobility: Needs Assistance Bed Mobility: Supine to Sit     Supine to sit: Supervision;HOB elevated     General bed mobility comments: pt requires encouragement to initiate supine>sit but able to perform without physical assist  Transfers Overall transfer level: Needs assistance   Transfers: Sit to/from Stand;Stand Pivot Transfers Sit to Stand: Supervision(bed rail) Stand pivot transfers: Supervision(bed rail, chair armrest)       General transfer comment: pt completes transfers with close supervision with pt electing to have UE support throughout movement  Ambulation/Gait             General Gait Details:  unable to encourage pt to attempt gait   Stairs             Wheelchair Mobility    Modified Rankin (Stroke Patients Only)       Balance Overall balance assessment: Needs assistance Sitting-balance support: Feet supported;No upper extremity supported Sitting balance-Leahy Scale: Fair Sitting balance - Comments: supervision for static sitting balance EOB     Standing balance-Leahy Scale: Fair Standing balance comment: pt is able to stand at EOB, elects to hold to bed rail with 1UE while performing posterior peri hygiene with other UE with close supervision overall                            Cognition Arousal/Alertness: Awake/alert Behavior During Therapy: Anxious Overall Cognitive Status: Difficult to assess                                 General Comments: Pt frequently asking "who are you? what are you doing?" despite therapist answering these questions seconds before, pt only oriented to self, when asked to perform various tasks (ex: transfers) pt will yell "I will not" as she is performing transfer, pt yelling for "help" at times for no apparent reason, pt asking to bathe then reporting she does not want to      Exercises      General Comments General comments (skin integrity, edema, etc.): pt very confused throughout session despite therapist attempting to orient her, pt with seconds long short term memory      Pertinent  Vitals/Pain Pain Assessment: Faces Faces Pain Scale: No hurt    Home Living                      Prior Function            PT Goals (current goals can now be found in the care plan section) Acute Rehab PT Goals Patient Stated Goal: none stated PT Goal Formulation: Patient unable to participate in goal setting Time For Goal Achievement: 05/19/19 Potential to Achieve Goals: Fair Progress towards PT goals: Progressing toward goals;PT to reassess next treatment    Frequency    Min 2X/week      PT  Plan Current plan remains appropriate    Co-evaluation              AM-PAC PT "6 Clicks" Mobility   Outcome Measure  Help needed turning from your back to your side while in a flat bed without using bedrails?: A Little Help needed moving from lying on your back to sitting on the side of a flat bed without using bedrails?: A Little Help needed moving to and from a bed to a chair (including a wheelchair)?: A Little Help needed standing up from a chair using your arms (e.g., wheelchair or bedside chair)?: A Little Help needed to walk in hospital room?: A Lot Help needed climbing 3-5 steps with a railing? : A Lot 6 Click Score: 16    End of Session   Activity Tolerance: Patient tolerated treatment well Patient left: in chair;with call bell/phone within reach;with chair alarm set;with nursing/sitter in room Nurse Communication: Mobility status PT Visit Diagnosis: Other abnormalities of gait and mobility (R26.89);Unsteadiness on feet (R26.81)     Time: [id number] PT Time Calculation (min) (ACUTE ONLY): 23 min  Charges:  $Therapeutic Activity: 23-37 mins                         Waunita Schooner, PT, DPT 05/11/2019, 3:14 PM

## 2019-05-12 LAB — GLUCOSE, CAPILLARY
Glucose-Capillary: 115 mg/dL — ABNORMAL HIGH (ref 70–99)
Glucose-Capillary: 104 mg/dL — ABNORMAL HIGH (ref 70–99)
Glucose-Capillary: 135 mg/dL — ABNORMAL HIGH (ref 70–99)
Glucose-Capillary: 122 mg/dL — ABNORMAL HIGH (ref 70–99)
Glucose-Capillary: 123 mg/dL — ABNORMAL HIGH (ref 70–99)

## 2019-05-12 NOTE — Progress Notes (Signed)
[facility] called, wanted to know patient plan. Notified discharge still pending.

## 2019-05-12 NOTE — Progress Notes (Signed)
Occupational Therapy Treatment Patient Details Name: Natalie Thomas MRN: [id number] DOB: 12/08/1960 Today's Date: 05/12/2019    History of present illness Natalie Thomas is an 58 y.o. female with medical history significant of bipolar/schizoaffective d/o;  HTN; HLD: DM; COPD; chronic hyponatremia; and CHF presenting with difficulty talking.  Initially code stroke called but MRI neg. Psych has made new med recs. HTN crisis upon arrival.    OT comments  Pt pleasantly confused throughout today's session. Pt followed 100% of simple commands and completed 3 therapist led ADL. Pt required Supervision-minguard for functional mobility at [place]. Pt took 3 pivotal steps toward recliner from EOB, pt declined further mobility despite education on importance of mobility. Pt continues to demonstrate cognitive limitations impacting safety and independence with ADL/IADL and functional mobility. Pt will continue to benefit from skilled OT services to maximize safety and independence with ADL/IADL and functional mobility. Will continue to follow acutely and progress as tolerated.    Follow Up Recommendations  SNF;Supervision/Assistance - 24 hour    Equipment Recommendations  Other (comment)(defer to next venue)    Recommendations for Other Services      Precautions / Restrictions Precautions Precautions: Fall Restrictions Weight Bearing Restrictions: No       Mobility Bed Mobility Overal bed mobility: Needs Assistance Bed Mobility: Supine to Sit     Supine to sit: Supervision;HOB elevated     General bed mobility comments: required 2 vc to progress to EOB  Transfers Overall transfer level: Needs assistance Equipment used: Rolling walker (2 wheeled) Transfers: Sit to/from [facility] Sit to Stand: Supervision Stand pivot transfers: Supervision       General transfer comment: use of RW for increased safety;pt took 3 steps toward recliner    Balance Overall balance  assessment: Needs assistance Sitting-balance support: Feet supported;No upper extremity supported Sitting balance-Leahy Scale: Fair Sitting balance - Comments: supervision for static sitting balance EOB   Standing balance support: Single extremity supported Standing balance-Leahy Scale: Fair Standing balance comment: pt took a 3 steps to the recliner with minguard                           ADL either performed or assessed with clinical judgement   ADL Overall ADL's : Needs assistance/impaired Eating/Feeding: Set up;Sitting Eating/Feeding Details (indicate cue type and reason): pt drinking from cup upon arrival     Upper Body Bathing: Set up;Sitting Upper Body Bathing Details (indicate cue type and reason): applied lotion to arms Lower Body Bathing: Set up;Sit to/from stand Lower Body Bathing Details (indicate cue type and reason): applied lotion to BLE         Toilet Transfer: Stand-pivot;Min Radio broadcast assistant Details (indicate cue type and reason): simulated from EOB to recliner Toileting- Clothing Manipulation and Hygiene: Moderate assistance Toileting - Clothing Manipulation Details (indicate cue type and reason): modA for thorough posterior pericare in standing     Functional mobility during ADLs: Min guard;Rolling walker;Supervision/safety General ADL Comments: ADL completed sitting in recliner     Vision       Perception     Praxis      Cognition Arousal/Alertness: Awake/alert Behavior During Therapy: Flat affect Overall Cognitive Status: No family/caregiver present to determine baseline cognitive functioning                                 General Comments: frequently asking "where  am I" when therapist stated "this is [facility]" pt replied, "yall keep saying that";pt compliant throughout session;soiled upon arrival, appeared unaware;followed simple commands 100% of the time this session         Exercises     Shoulder Instructions       General Comments pt pleasantly confused this session    Pertinent Vitals/ Pain       Pain Assessment: No/denies pain  Home Living                                          Prior Functioning/Environment              Frequency  Min 2X/week        Progress Toward Goals  OT Goals(current goals can now be found in the care plan section)  Progress towards OT goals: Progressing toward goals  Acute Rehab OT Goals Patient Stated Goal: none stated OT Goal Formulation: With patient Time For Goal Achievement: 05/20/19 Potential to Achieve Goals: Good ADL Goals Pt Will Perform Grooming: with set-up Pt Will Perform Upper Body Dressing: with set-up Pt Will Perform Lower Body Dressing: with supervision;sit to/from stand Pt Will Transfer to Toilet: with min assist;stand pivot transfer Additional ADL Goal #1: Pt will follow one-step commands consistently. Additional ADL Goal #2: Pt will engage in therapist led ADL 75% of the time.  Plan Discharge plan remains appropriate    Co-evaluation                 AM-PAC OT "6 Clicks" Daily Activity     Outcome Measure   Help from another person eating meals?: A Little Help from another person taking care of personal grooming?: A Little Help from another person toileting, which includes using toliet, bedpan, or urinal?: A Little Help from another person bathing (including washing, rinsing, drying)?: A Little Help from another person to put on and taking off regular upper body clothing?: A Little Help from another person to put on and taking off regular lower body clothing?: A Little 6 Click Score: 18    End of Session Equipment Utilized During Treatment: Gait belt;Rolling walker  OT Visit Diagnosis: Other abnormalities of gait and mobility (R26.89);Other symptoms and signs involving cognitive function   Activity Tolerance Patient tolerated treatment well    Patient Left in chair;with call bell/phone within reach;with chair alarm set   Nurse Communication Mobility status        Time: [id number] OT Time Calculation (min): 19 min  Charges: OT General Charges $OT Visit: 1 Visit OT Treatments $Self Care/Home Management : 8-22 mins  Dorinda Hill OTR/L [place] Office: [place] 05/12/2019, 11:06 AM

## 2019-05-12 NOTE — TOC Progression Note (Signed)
Transition of Care [facility]) - Progression Note    Patient Details  Name: Natalie Thomas MRN: [id number] Date of Birth: 1961-05-12  Transition of Care [facility] CM/SW Contact  Pollie Friar, RN Phone Number: 05/12/2019, 3:10 PM  Clinical Narrative:    Pt did [facility] interview. [facility] had offered a bed but have now retracted. CM has reached out to a couple other SNF rehabs to see if they can offer. TOC following.   Expected Discharge Plan: [place] Barriers to Discharge: Continued Medical Work up  Expected Discharge Plan and Services Expected Discharge Plan: [place] In-house Referral: Clinical Social Work Discharge Planning Services: CM Consult Post Acute Care Choice: [place] arrangements for the past 2 months: Assisted Living Facility([place])                                       Social Determinants of Health (SDOH) Interventions    Readmission Risk Interventions Readmission Risk Prevention Plan 03/10/2019 12/08/2018  Transportation Screening Complete Complete  Medication Review Press photographer) Complete Complete  PCP or Specialist appointment within 3-5 days of discharge Complete Complete  HRI or Home Care Consult Complete Complete  SW Recovery Care/Counseling Consult Complete Complete  Palliative Care Screening Not Applicable Not Wall Complete Complete  Some recent data might be hidden

## 2019-05-12 NOTE — Progress Notes (Signed)
Marland Kitchen  PROGRESS NOTE    Natalie Thomas  [id number] DOB: 04-Dec-1960 DOA: 05/01/2019 PCP: System, Pcp Not In   Brief Narrative:   Natalie Thomas an 58 y.o.femalewith medical history significant ofbipolar/schizoaffective d/o; HTN; HLD: DM; COPD; chronic hyponatremia; and CHF presenting with difficulty talking. At baseline at the time of last discharge, she was calm and comfortable, "pleasantly confused, nonsensical speech."   11/17: No acute events ON per nursing.    Assessment & Plan:   Principal Problem:   Hypertensive crisis Active Problems:   DM (diabetes mellitus), type 2 (HCC)   COPD (chronic obstructive pulmonary disease) (HCC)   Schizoaffective disorder, bipolar type (HCC)   Hyperlipidemia   Chronic diastolic CHF (congestive heart failure) (HCC)  HTN crisis with Abnormal EKG - Pt presented w/ AMS and speech dysfunction; ? if different from baseline - BP is currently at goal. - continue lisinopril 40 mg daily, Lopressor 25 mg BID, nifedipine 90 mg daily, PRN hydralazine     - watch HR  Schizoaffective disorder/bipolar type Anxiety - Seen by psych; recs below - Schizoaffective d/o; bipolar type - Recommend tapering off of Latuda from 40 mg BID to 20 mg BID - Start Zyprexa 5 mg BID - Discontinue Wellbutrin 150 mg daily (increases anxiety) - Start Celexa 20 mg daily at bedtime, assists with sleep also  - Anxiety: - Recommend increasing gabapentin 100 mg daily to BID - Recommend discontinuing Xanax 0.5 mg TID - Recommend Klonopin 0.5 mg BID (longer acting) - klonopin 1mg  BID     - calm and appropriately interactive this AM; she is eager to go though  Acute metabolic encephalopathy in the setting of mental illness - Management as above - EEG no seizure seen. - One-to-one on 05/04/2019 for patient's own safety - Continue  fall precautions  COPD - continue Combivent, Breo Ellipta, Albuterol - stable, continue as above  HLD - no home meds; follow up with PCP  Chronic diastolic CHF - Appears to be compensated - 12/04/18 echo with preserved EF and "impaired" relaxation - continue metoprolol, lisinopril, procardia  Obesity - Body mass index is 31.24 kg/m - Recommend weight loss outpatient with regular physical activity activity and healthy dieting.  Physical debility - PT/OT: SNF, 24-hr supervision - continue PT/OT with fall precautions     - working on SNF option  Chronic hyponatremia - continue salt tablets     - periodic lab checks  Awaiting PASAR eval.  DVT prophylaxis: lovenox Code Status: FULL   Disposition Plan: TBD  Consultants:   Neurology  Psychiatry  Subjective: "I don't know why I'm."  Objective: Vitals:   05/11/19 1606 05/11/19 2025 05/11/19 2349 05/12/19 0407  BP: (!) 157/59 (!) 157/61 (!) 153/65 (!) 154/51  Pulse: 63 61 63 (!) 55  Resp: 18 20 20 20   Temp: 98.1 F (36.7 C) 98 F (36.7 C) 98.3 F (36.8 C) 97.7 F (36.5 C)  TempSrc: Oral Oral  Oral  SpO2: 97% 95% 97% 96%  Weight:      Height:        Intake/Output Summary (Last 24 hours) at 05/12/2019 0746 Last data filed at 05/11/2019 2207 Gross per 24 hour  Intake 1060 ml  Output 3500 ml  Net -2440 ml   Filed Weights   05/01/19 1100 05/01/19 1437  Weight: 80 kg 80 kg    Examination:  General: 58 y.o. female resting in bed in NAD Cardiovascular: RRR, +S1, S2, no m/g/r, equal pulses throughout Respiratory: CTABL, no  w/r/r, normal WOB GI: BS+, NDNT, no masses noted, no organomegaly noted MSK: No e/c/c Neuro: Alert to name, follows commands Psyc: flat affect, calm/cooperative   Data Reviewed: I have personally reviewed following labs and imaging studies.  CBC: Recent Labs  Lab 05/06/19 0307 05/07/19 0346 05/09/19 0319  WBC 8.8 6.6 6.7   NEUTROABS  --  3.8 4.1  HGB 13.7 13.7 14.0  HCT 41.5 41.1 43.3  MCV 86.8 85.3 86.4  PLT 194 188 [id number]   Basic Metabolic Panel: Recent Labs  Lab 05/06/19 0307 05/07/19 0346 05/08/19 0423 05/09/19 0319  NA 137 138  --  133*  K 3.8 4.2  --  4.8  CL 104 104  --  98  CO2 21* 24  --  25  GLUCOSE 73 97  --  123*  BUN 31* 28*  --  17  CREATININE 0.74 0.69 0.61 0.60  CALCIUM 9.1 9.0  --  9.4  MG  --  1.8  --  1.8  PHOS  --  4.8*  --  5.1*   GFR: Estimated Creatinine Clearance: 77.7 mL/min (by C-G formula based on SCr of 0.6 mg/dL). Liver Function Tests: Recent Labs  Lab 05/07/19 0346 05/09/19 0319  ALBUMIN 3.0* 3.0*   No results for input(s): LIPASE, AMYLASE in the last 168 hours. No results for input(s): AMMONIA in the last 168 hours. Coagulation Profile: No results for input(s): INR, PROTIME in the last 168 hours. Cardiac Enzymes: No results for input(s): CKTOTAL, CKMB, CKMBINDEX, TROPONINI in the last 168 hours. BNP (last 3 results) No results for input(s): PROBNP in the last 8760 hours. HbA1C: No results for input(s): HGBA1C in the last 72 hours. CBG: Recent Labs  Lab 05/11/19 0612 05/11/19 1142 05/11/19 1621 05/11/19 2024 05/12/19 0626  GLUCAP 85 96  96 123* 131* 104*   Lipid Profile: No results for input(s): CHOL, HDL, LDLCALC, TRIG, CHOLHDL, LDLDIRECT in the last 72 hours. Thyroid Function Tests: No results for input(s): TSH, T4TOTAL, FREET4, T3FREE, THYROIDAB in the last 72 hours. Anemia Panel: No results for input(s): VITAMINB12, FOLATE, FERRITIN, TIBC, IRON, RETICCTPCT in the last 72 hours. Sepsis Labs: No results for input(s): PROCALCITON, LATICACIDVEN in the last 168 hours.  No results found for this or any previous visit (from the past 240 hour(s)).    Radiology Studies: No results found.   Scheduled Meds: . aspirin EC  81 mg Oral Daily  . citalopram  20 mg Oral QHS  . clonazePAM  1 mg Oral BID  . docusate sodium  100 mg Oral BID  .  enoxaparin (LOVENOX) injection  40 mg Subcutaneous Q24H  . feeding supplement (ENSURE ENLIVE)  237 mL Oral Q24H  . feeding supplement (PRO-STAT SUGAR FREE 64)  30 mL Oral BID  . fluticasone  2 spray Each Nare BID  . fluticasone furoate-vilanterol  1 puff Inhalation Daily  . gabapentin  100 mg Oral BID  . guaiFENesin  1,200 mg Oral Daily  . lisinopril  40 mg Oral Daily  . loratadine  10 mg Oral Daily  . lurasidone  20 mg Oral BID  . Melatonin  6 mg Oral QHS  . metoprolol tartrate  25 mg Oral BID  . NIFEdipine  90 mg Oral Daily  . OLANZapine  5 mg Oral BID  . pantoprazole  40 mg Oral QHS  . polyethylene glycol  17 g Oral Daily  . sodium chloride  1 g Oral TID WC  . valproic acid  500 mg  Oral BID   Continuous Infusions:   LOS: 10 days    Time spent: 15 minutes spent in the coordination of care today.    Jonnie Finner, DO [facility] Pager [phone]  If 7PM-7AM, please contact night-coverage [email or web address] Password [facility] 05/12/2019, 7:46 AM

## 2019-05-13 LAB — CBC WITH DIFFERENTIAL/PLATELET
Abs Immature Granulocytes: 0.07 10*3/uL (ref 0.00–0.07)
Basophils Absolute: 0 10*3/uL (ref 0.0–0.1)
Basophils Relative: 1 %
Eosinophils Absolute: 0.1 10*3/uL (ref 0.0–0.5)
Eosinophils Relative: 1 %
HCT: 39.7 % (ref 36.0–46.0)
Hemoglobin: 13.2 g/dL (ref 12.0–15.0)
Immature Granulocytes: 1 %
Lymphocytes Relative: 30 %
Lymphs Abs: 1.7 10*3/uL (ref 0.7–4.0)
MCH: 28.4 pg (ref 26.0–34.0)
MCHC: 33.2 g/dL (ref 30.0–36.0)
MCV: 85.6 fL (ref 80.0–100.0)
Monocytes Absolute: 0.6 10*3/uL (ref 0.1–1.0)
Monocytes Relative: 11 %
Neutro Abs: 3.3 10*3/uL (ref 1.7–7.7)
Neutrophils Relative %: 56 %
Platelets: 200 10*3/uL (ref 150–400)
RBC: 4.64 MIL/uL (ref 3.87–5.11)
RDW: 13.9 % (ref 11.5–15.5)
WBC: 5.8 10*3/uL (ref 4.0–10.5)
nRBC: 0 % (ref 0.0–0.2)

## 2019-05-13 LAB — GLUCOSE, CAPILLARY
Glucose-Capillary: 106 mg/dL — ABNORMAL HIGH (ref 70–99)
Glucose-Capillary: 127 mg/dL — ABNORMAL HIGH (ref 70–99)
Glucose-Capillary: 134 mg/dL — ABNORMAL HIGH (ref 70–99)
Glucose-Capillary: 129 mg/dL — ABNORMAL HIGH (ref 70–99)

## 2019-05-13 LAB — RENAL FUNCTION PANEL
Albumin: 2.9 g/dL — ABNORMAL LOW (ref 3.5–5.0)
Anion gap: 9 (ref 5–15)
BUN: 29 mg/dL — ABNORMAL HIGH (ref 6–20)
CO2: 26 mmol/L (ref 22–32)
Calcium: 9 mg/dL (ref 8.9–10.3)
Chloride: 95 mmol/L — ABNORMAL LOW (ref 98–111)
Creatinine, Ser: 0.67 mg/dL (ref 0.44–1.00)
GFR calc Af Amer: >60 mL/min (ref >60)
GFR calc non Af Amer: >60 mL/min (ref >60)
Glucose, Bld: 116 mg/dL — ABNORMAL HIGH (ref 70–99)
Phosphorus: 4.1 mg/dL (ref 2.5–4.6)
Potassium: 4.9 mmol/L (ref 3.5–5.1)
Sodium: 130 mmol/L — ABNORMAL LOW (ref 135–145)

## 2019-05-13 LAB — MAGNESIUM: Magnesium: 1.9 mg/dL (ref 1.7–2.4)

## 2019-05-13 NOTE — Progress Notes (Signed)
PROGRESS NOTE   Natalie Thomas  [id number]    DOB: Mar 13, 1961    DOA: 05/01/2019  PCP: System, Pcp Not In   I have briefly reviewed patients previous medical records in [facility].  Chief Complaint  Patient presents with  . Code Stroke    Brief Narrative:  58 year old female with PMH of bipolar/schizoaffective disorder, HTN, HLD, DM, COPD, chronic hyponatremia and CHF, resident of ALF, brought in for concern for speech difficulty, had SBP's high in the 200s, neurology consulted, asked stroke imaging negative, felt that this was due to hypertensive urgency versus related to her psychiatric illness.   Assessment & Plan:   Principal Problem:   Hypertensive crisis Active Problems:   DM (diabetes mellitus), type 2 (HCC)   COPD (chronic obstructive pulmonary disease) (HCC)   Schizoaffective disorder, bipolar type (HCC)   Hyperlipidemia   Chronic diastolic CHF (congestive heart failure) ([place])   Hypertensive urgency Presented with altered mental status and speech dysfunction.  Neurology consulted, evaluated and negative for stroke. Blood pressures well controlled on current regimen as below.  Schizoaffective disorder/bipolar disorder/anxiety Evaluated by psychiatry.  Multiple medication changes were made as per recommendations as per PN 11/17. Appears stable.  Acute metabolic encephalopathy in the setting of mental illness\ EEG without seizures. Seems to have resolved.  COPD Stable without clinical bronchospasm.  Hyperlipidemia Not on medications prior to admission.  Chronic diastolic CHF Compensated. Echo this admission with preserved EF.  Obesity/Body mass index is 31.24 kg/m.  Chronic hyponatremia Sodium stable in the 130s.   DVT prophylaxis: Lovenox Code Status: Full Family Communication: None at bedside Disposition: Awaiting SNF.   Consultants:  Neurology Psych  Procedures:    Antimicrobials:  None   Subjective: Patient denies  complaints.  "I want to go home".  Denies pain, AVH, SI or HI.  Objective:  Vitals:   05/13/19 0824 05/13/19 0829 05/13/19 1203 05/13/19 1524  BP:  (!) 104/52 128/79 139/68  Pulse:  (!) 51 (!) 54 (!) 55  Resp:  18 18 18   Temp:  98 F (36.7 C) 98 F (36.7 C) 97.7 F (36.5 C)  TempSrc:  Oral Oral Oral  SpO2: 94% 97% 100% 98%  Weight:      Height:        Examination:  General exam: Pleasant young female sitting up comfortably in bed. Respiratory system: Clear to auscultation. Respiratory effort normal. Cardiovascular system: S1 & S2 heard, RRR. No JVD, murmurs, rubs, gallops or clicks. No pedal edema.  Telemetry personally reviewed: SR-SB in the 69s. Gastrointestinal system: Abdomen is nondistended, soft and nontender. No organomegaly or masses felt. Normal bowel sounds heard. Central nervous system: Alert and oriented x2. No focal neurological deficits. Extremities: Symmetric 5 x 5 power. Skin: No rashes, lesions or ulcers Psychiatry: Judgement and insight appear impaired. Mood & affect flat.     Data Reviewed: I have personally reviewed following labs and imaging studies   CBC: Recent Labs  Lab 05/07/19 0346 05/09/19 0319 05/13/19 0342  WBC 6.6 6.7 5.8  NEUTROABS 3.8 4.1 3.3  HGB 13.7 14.0 13.2  HCT 41.1 43.3 39.7  MCV 85.3 86.4 85.6  PLT 188 186 [id number]    Basic Metabolic Panel: Recent Labs  Lab 05/07/19 0346 05/08/19 0423 05/09/19 0319 05/13/19 0342  NA 138  --  133* 130*  K 4.2  --  4.8 4.9  CL 104  --  98 95*  CO2 24  --  25 26  GLUCOSE 97  --  123* 116*  BUN 28*  --  17 29*  CREATININE 0.69 0.61 0.60 0.67  CALCIUM 9.0  --  9.4 9.0  MG 1.8  --  1.8 1.9  PHOS 4.8*  --  5.1* 4.1    Liver Function Tests: Recent Labs  Lab 05/07/19 0346 05/09/19 0319 05/13/19 0342  ALBUMIN 3.0* 3.0* 2.9*    CBG: Recent Labs  Lab 05/12/19 1649 05/12/19 2138 05/13/19 0621 05/13/19 1201 05/13/19 1633  GLUCAP 122* 123* 106* 127* 134*    No results found  for this or any previous visit (from the past 240 hour(s)).    Radiology Studies: No results found.        Scheduled Meds: . aspirin EC  81 mg Oral Daily  . citalopram  20 mg Oral QHS  . clonazePAM  1 mg Oral BID  . docusate sodium  100 mg Oral BID  . enoxaparin (LOVENOX) injection  40 mg Subcutaneous Q24H  . feeding supplement (ENSURE ENLIVE)  237 mL Oral Q24H  . feeding supplement (PRO-STAT SUGAR FREE 64)  30 mL Oral BID  . fluticasone  2 spray Each Nare BID  . fluticasone furoate-vilanterol  1 puff Inhalation Daily  . gabapentin  100 mg Oral BID  . guaiFENesin  1,200 mg Oral Daily  . lisinopril  40 mg Oral Daily  . loratadine  10 mg Oral Daily  . lurasidone  20 mg Oral BID  . Melatonin  6 mg Oral QHS  . metoprolol tartrate  25 mg Oral BID  . NIFEdipine  90 mg Oral Daily  . OLANZapine  5 mg Oral BID  . pantoprazole  40 mg Oral QHS  . polyethylene glycol  17 g Oral Daily  . sodium chloride  1 g Oral TID WC  . valproic acid  500 mg Oral BID   Continuous Infusions:   LOS: 11 days     Vernell Leep, MD, [place], [facility]. [facility]  To contact the attending provider between 7A-7P or the covering provider during after hours 7P-7A, please log into the web site [email or web address] and access using universal [place] password for that web site. If you do not have the password, please call the hospital operator.  05/13/2019, 6:57 PM

## 2019-05-14 LAB — BASIC METABOLIC PANEL
Anion gap: 10 (ref 5–15)
BUN: 28 mg/dL — ABNORMAL HIGH (ref 6–20)
CO2: 24 mmol/L (ref 22–32)
Calcium: 8.5 mg/dL — ABNORMAL LOW (ref 8.9–10.3)
Chloride: 93 mmol/L — ABNORMAL LOW (ref 98–111)
Creatinine, Ser: 0.7 mg/dL (ref 0.44–1.00)
GFR calc Af Amer: >60 mL/min (ref >60)
GFR calc non Af Amer: >60 mL/min (ref >60)
Glucose, Bld: 106 mg/dL — ABNORMAL HIGH (ref 70–99)
Potassium: 4.7 mmol/L (ref 3.5–5.1)
Sodium: 127 mmol/L — ABNORMAL LOW (ref 135–145)

## 2019-05-14 LAB — GLUCOSE, CAPILLARY
Glucose-Capillary: 114 mg/dL — ABNORMAL HIGH (ref 70–99)
Glucose-Capillary: 104 mg/dL — ABNORMAL HIGH (ref 70–99)
Glucose-Capillary: 128 mg/dL — ABNORMAL HIGH (ref 70–99)
Glucose-Capillary: 139 mg/dL — ABNORMAL HIGH (ref 70–99)

## 2019-05-14 NOTE — Progress Notes (Signed)
PROGRESS NOTE   Natalie Thomas  [id number]    DOB: Dec 16, 1960    DOA: 05/01/2019  PCP: System, Pcp Not In   I have briefly reviewed patients previous medical records in [facility].  Chief Complaint  Patient presents with  . Code Stroke    Brief Narrative:  58 year old female with PMH of bipolar/schizoaffective disorder, HTN, HLD, DM, COPD, chronic hyponatremia and CHF, resident of ALF, brought in for concern for speech difficulty, had SBP's high in the 200s, neurology consulted, asked stroke imaging negative, felt that this was due to hypertensive urgency versus related to her psychiatric illness.  Course complicated by gradually worsening hyponatremia.  Fluid restriction initiated.   Assessment & Plan:   Principal Problem:   Hypertensive crisis Active Problems:   DM (diabetes mellitus), type 2 (HCC)   COPD (chronic obstructive pulmonary disease) (HCC)   Schizoaffective disorder, bipolar type (HCC)   Hyperlipidemia   Chronic diastolic CHF (congestive heart failure) ([place])   Hypertensive urgency Presented with altered mental status and speech dysfunction.  Neurology consulted, evaluated and negative for stroke. Blood pressures mildly uncontrolled.  Currently on lisinopril 40 mg daily, metoprolol 25 mg twice daily nifedipine 90 mg daily. Continue current regimen.  Schizoaffective disorder/bipolar disorder/anxiety Evaluated by psychiatry.  Multiple medication changes were made as per recommendations as per PN 11/17. Appears stable. Gradually dropping serum sodium.  Unclear if this is due to increased free water intake versus SIADH from multiple psychiatric medications.  Please see management below.  Acute metabolic encephalopathy in the setting of mental illness\ EEG without seizures. Seems to have resolved.  COPD Stable without clinical bronchospasm.  Hyperlipidemia Not on medications prior to admission.  Chronic diastolic CHF Compensated. Echo this  admission with preserved EF.  Obesity/Body mass index is 31.24 kg/m.  Chronic hyponatremia Serum sodium which was 138 on 11/12 has gradually drifted down to 127 today.  Asymptomatic of same.  As per discussion with RN, increased fluid intake, not certain if this is all free water. Fluid restriction.  Follow BMP in a.m.  If does not improve or worsens, consider repeating serum and urine osmolarity, reviewing psych medications with psychiatry.   DVT prophylaxis: Lovenox Code Status: Full Family Communication: None at bedside Disposition: Awaiting SNF.  DC when medically ready.   Consultants:  Neurology Psych  Procedures:    Antimicrobials:  None   Subjective: Patient denies specific complaints.  Does not say much.  As per RN, no acute issues noted.  Objective:  Vitals:   05/14/19 0734 05/14/19 0758 05/14/19 1143 05/14/19 1551  BP:  (!) 161/62 (!) 173/67 (!) 166/63  Pulse:  (!) 57 (!) 59 62  Resp:  18 18 18   Temp:  98.2 F (36.8 C) 99.3 F (37.4 C) 98 F (36.7 C)  TempSrc:  Oral Oral Oral  SpO2: 94% 94% 97% 95%  Weight:      Height:        Examination:  General exam: Pleasant young female sitting up comfortably in bed. Respiratory system: Clear to auscultation.  No increased work of breathing. Cardiovascular system: S1 & S2 heard, RRR. No JVD, murmurs, rubs, gallops or clicks. No pedal edema.  Telemetry personally reviewed: SR-SB in the 82s. Gastrointestinal system: Abdomen is nondistended, soft and nontender. No organomegaly or masses felt. Normal bowel sounds heard. Central nervous system: Alert and oriented x2. No focal neurological deficits. Extremities: Symmetric 5 x 5 power. Skin: No rashes, lesions or ulcers Psychiatry: Judgement and insight appear impaired.  Mood & affect flat.    Data Reviewed: I have personally reviewed following labs and imaging studies   CBC: Recent Labs  Lab 05/09/19 0319 05/13/19 0342  WBC 6.7 5.8  NEUTROABS 4.1 3.3  HGB  14.0 13.2  HCT 43.3 39.7  MCV 86.4 85.6  PLT 186 [id number]    Basic Metabolic Panel: Recent Labs  Lab 05/08/19 0423 05/09/19 0319 05/13/19 0342 05/14/19 0505  NA  --  133* 130* 127*  K  --  4.8 4.9 4.7  CL  --  98 95* 93*  CO2  --  25 26 24   GLUCOSE  --  123* 116* 106*  BUN  --  17 29* 28*  CREATININE 0.61 0.60 0.67 0.70  CALCIUM  --  9.4 9.0 8.5*  MG  --  1.8 1.9  --   PHOS  --  5.1* 4.1  --     Liver Function Tests: Recent Labs  Lab 05/09/19 0319 05/13/19 0342  ALBUMIN 3.0* 2.9*    CBG: Recent Labs  Lab 05/13/19 1633 05/13/19 2129 05/14/19 0616 05/14/19 1141 05/14/19 1629  GLUCAP 134* 129* 114* 104* 128*    No results found for this or any previous visit (from the past 240 hour(s)).    Radiology Studies: No results found.        Scheduled Meds: . aspirin EC  81 mg Oral Daily  . citalopram  20 mg Oral QHS  . clonazePAM  1 mg Oral BID  . docusate sodium  100 mg Oral BID  . enoxaparin (LOVENOX) injection  40 mg Subcutaneous Q24H  . feeding supplement (ENSURE ENLIVE)  237 mL Oral Q24H  . feeding supplement (PRO-STAT SUGAR FREE 64)  30 mL Oral BID  . fluticasone  2 spray Each Nare BID  . fluticasone furoate-vilanterol  1 puff Inhalation Daily  . gabapentin  100 mg Oral BID  . guaiFENesin  1,200 mg Oral Daily  . lisinopril  40 mg Oral Daily  . loratadine  10 mg Oral Daily  . lurasidone  20 mg Oral BID  . Melatonin  6 mg Oral QHS  . metoprolol tartrate  25 mg Oral BID  . NIFEdipine  90 mg Oral Daily  . OLANZapine  5 mg Oral BID  . pantoprazole  40 mg Oral QHS  . polyethylene glycol  17 g Oral Daily  . sodium chloride  1 g Oral TID WC  . valproic acid  500 mg Oral BID   Continuous Infusions:   LOS: 12 days     Vernell Leep, MD, [place], [facility]. [facility]  To contact the attending provider between 7A-7P or the covering provider during after hours 7P-7A, please log into the web site [email or web address] and access using universal [place]  password for that web site. If you do not have the password, please call the hospital operator.  05/14/2019, 6:42 PM

## 2019-05-14 NOTE — Progress Notes (Signed)
Patient arrived to unit, report received from ED RN. Seizure precautions in place. Bed in lowest position, bed rails up, bed alarm on, call bell in reach.

## 2019-05-14 NOTE — Progress Notes (Signed)
Physical Therapy Treatment Patient Details Name: Natalie Thomas MRN: [id number] DOB: Jun 16, 1961 Today's Date: 05/14/2019    History of Present Illness BREND CHAGOYA is an 58 y.o. female with medical history significant of bipolar/schizoaffective d/o;  HTN; HLD: DM; COPD; chronic hyponatremia; and CHF presenting with difficulty talking.  Initially code stroke called but MRI neg. Psych has made new med recs. HTN crisis upon arrival.     PT Comments    Pt performed functional mobility including rolling and sitting edge of bed.  She required supervision to min assistance and was able to stand and takes steps from her bed to her recliner.  Pt continues to be limited due to back pain and weakness.  She continues to benefit from return to SNF at d/c.     Follow Up Recommendations  SNF;Supervision/Assistance - 24 hour     Equipment Recommendations  (TBD at next venue)    Recommendations for Other Services       Precautions / Restrictions Precautions Precautions: Fall Restrictions Weight Bearing Restrictions: No    Mobility  Bed Mobility Overal bed mobility: Needs Assistance Bed Mobility: Supine to Sit;Rolling Rolling: Supervision   Supine to sit: Min assist     General bed mobility comments: Pt rolled to her R side to clean patient of urinary incontinence.  She required min assistance to elevate her trunk into sitting after rolling.  Transfers Overall transfer level: Needs assistance Equipment used: Rolling walker (2 wheeled) Transfers: Sit to/from [facility] Sit to Stand: Min guard Stand pivot transfers: Min guard       General transfer comment: Cues for hand placement to and from seated surface,  Pt able to take steps from bed to chair.  Cues for RW safety.  Ambulation/Gait                 Stairs             Wheelchair Mobility    Modified Rankin (Stroke Patients Only)       Balance Overall balance assessment: Needs  assistance Sitting-balance support: Feet supported;No upper extremity supported Sitting balance-Leahy Scale: Fair       Standing balance-Leahy Scale: Fair                              Cognition Arousal/Alertness: Awake/alert Behavior During Therapy: Flat affect Overall Cognitive Status: No family/caregiver present to determine baseline cognitive functioning                                 General Comments: Pt continues to be confused and asking, "where am I?"      Exercises      General Comments        Pertinent Vitals/Pain Pain Assessment: No/denies pain    Home Living                      Prior Function            PT Goals (current goals can now be found in the care plan section) Acute Rehab PT Goals Patient Stated Goal: none stated Potential to Achieve Goals: Fair Progress towards PT goals: Progressing toward goals    Frequency    Min 2X/week      PT Plan Current plan remains appropriate    Co-evaluation  AM-PAC PT "6 Clicks" Mobility   Outcome Measure  Help needed turning from your back to your side while in a flat bed without using bedrails?: A Little Help needed moving from lying on your back to sitting on the side of a flat bed without using bedrails?: A Little Help needed moving to and from a bed to a chair (including a wheelchair)?: A Little Help needed standing up from a chair using your arms (e.g., wheelchair or bedside chair)?: A Little Help needed to walk in hospital room?: A Lot Help needed climbing 3-5 steps with a railing? : A Lot 6 Click Score: 16    End of Session Equipment Utilized During Treatment: Oxygen Activity Tolerance: Patient tolerated treatment well Patient left: in chair;with call bell/phone within reach;with chair alarm set;with nursing/sitter in room Nurse Communication: Mobility status PT Visit Diagnosis: Other abnormalities of gait and mobility  (R26.89);Unsteadiness on feet (R26.81) Pain - part of body: (generalized.)     Time: [id number] PT Time Calculation (min) (ACUTE ONLY): 16 min  Charges:  $Therapeutic Activity: 8-22 mins                     Erasmo Leventhal , PTA Acute Rehabilitation Services Pager [phone] Office [phone]     Trevelle Mcgurn Eli Hose 05/14/2019, 5:20 PM

## 2019-05-15 LAB — BASIC METABOLIC PANEL
Anion gap: 10 (ref 5–15)
BUN: 27 mg/dL — ABNORMAL HIGH (ref 6–20)
CO2: 25 mmol/L (ref 22–32)
Calcium: 8.8 mg/dL — ABNORMAL LOW (ref 8.9–10.3)
Chloride: 95 mmol/L — ABNORMAL LOW (ref 98–111)
Creatinine, Ser: 0.7 mg/dL (ref 0.44–1.00)
GFR calc Af Amer: >60 mL/min (ref >60)
GFR calc non Af Amer: >60 mL/min (ref >60)
Glucose, Bld: 100 mg/dL — ABNORMAL HIGH (ref 70–99)
Potassium: 4.7 mmol/L (ref 3.5–5.1)
Sodium: 130 mmol/L — ABNORMAL LOW (ref 135–145)

## 2019-05-15 LAB — GLUCOSE, CAPILLARY
Glucose-Capillary: 92 mg/dL (ref 70–99)
Glucose-Capillary: 118 mg/dL — ABNORMAL HIGH (ref 70–99)
Glucose-Capillary: 114 mg/dL — ABNORMAL HIGH (ref 70–99)

## 2019-05-15 LAB — SARS CORONAVIRUS 2 (TAT 6-24 HRS): SARS Coronavirus 2: NEGATIVE

## 2019-05-15 MED ORDER — CLONAZEPAM 1 MG PO TABS: 1.0000 mg | ORAL_TABLET | Freq: Two times a day (BID) | ORAL | 0 refills | Status: DC

## 2019-05-15 MED ORDER — CITALOPRAM HYDROBROMIDE 20 MG PO TABS: 20.0000 mg | ORAL_TABLET | Freq: Every day | ORAL | Status: DC

## 2019-05-15 MED ORDER — GABAPENTIN 100 MG PO CAPS: 100.0000 mg | ORAL_CAPSULE | Freq: Two times a day (BID) | ORAL | Status: DC

## 2019-05-15 MED ORDER — OLANZAPINE 5 MG PO TABS: 5.0000 mg | ORAL_TABLET | Freq: Two times a day (BID) | ORAL | Status: DC

## 2019-05-15 MED ORDER — OMEPRAZOLE 40 MG PO CPDR: 40.0000 mg | DELAYED_RELEASE_CAPSULE | Freq: Every day | ORAL | Status: DC

## 2019-05-15 MED ORDER — LURASIDONE HCL 20 MG PO TABS: 20.0000 mg | ORAL_TABLET | Freq: Two times a day (BID) | ORAL | Status: AC

## 2019-05-15 NOTE — Progress Notes (Deleted)
Bladder scan 953, patient able to urinate, post void 618. MD notified, RN to monitor. Will attempt to urinate again in 1-2 hours. Patient also requesting to drink water, MD gave verbal order to place order for regular diet if she passes swallow screen.

## 2019-05-15 NOTE — Discharge Summary (Signed)
Physician Discharge Summary  Natalie Thomas [id number] DOB: 05/29/1961  PCP: System, Pcp Not In  Admitted from: Home Discharged to: SNF  Admit date: 05/01/2019 Discharge date: 05/15/2019  Recommendations for Outpatient Follow-up:    Contact information for follow-up providers    MD at SNF. Schedule an appointment as soon as possible for a visit.   Why: To be seen in 2 to 3 days with repeat labs (CBC & BMP) & EKG for QTC monitoring.  Follow BMP periodically thereafter for hyponatremia.  Recommend outpatient psychiatric consultation and follow-up.  Needs PCP follow-up upon discharge from SNF.           Contact information for after-discharge care    [facility] .   Service: Skilled Nursing Contact information: [address] [place]: None Equipment/Devices: TBD at SNF  Discharge Condition: Improved and stable CODE STATUS: Full Diet recommendation: Heart healthy diet  Discharge Diagnoses:  Principal Problem:   Hypertensive crisis Active Problems:   DM (diabetes mellitus), type 2 (HCC)   COPD (chronic obstructive pulmonary disease) (HCC)   Schizoaffective disorder, bipolar type ([place])   Hyperlipidemia   Chronic diastolic CHF (congestive heart failure) ([place])   Brief Summary: 58 year old female with PMH of bipolar/schizoaffective disorder, HTN, HLD, DM, COPD, chronic hyponatremia and CHF, resident of ALF, brought in for concern for speech difficulty, had SBP's high in the 200s, neurology consulted, stroke work-up negative, felt that this was due to hypertensive urgency versus related to her psychiatric illness.  Course complicated by gradually worsening hyponatremia.  Fluid restriction initiated.   Assessment & Plan:  Hypertensive urgency/crisis  Presented with altered mental status and speech dysfunction.  Initially stroke code was called.Neurology  consulted, evaluated and negative for stroke.  She was recently admitted with hypertensive crisis.  Blood pressures mildly uncontrolled.  Currently on lisinopril 40 mg daily, metoprolol 25 mg twice daily nifedipine 90 mg daily.  Continue current regimen.  Monitor at SNF and adjust medications as needed.  Schizoaffective disorder/bipolar disorder/anxiety  Patient reportedly stayed at [facility]/ALF prior to hospitalization.  She was followed by her psychiatrist Dr. Lucita Ferrara at the facility.  She reportedly sees a therapist regularly, Colletta [place].  She was on multiple psychiatric medications with concern for duplications.  Due to symptoms i.e. anxiety, pulling at medical devices, psychiatry was consulted.  They were also consulted to review her psychiatric polypharmacy. Evaluated by psychiatry.  Multiple medication changes were made as per psychiatry recommendations.  Please refer to their detailed note from 05/04/2019.  In summary: For her schizoaffective disorder/bipolar disorder, Anette Guarneri was reduced from 40 mg twice daily to 20 mg twice daily, started Zyprexa 5 mg twice daily, discontinued Wellbutrin because it increases anxiety, started Celexa 20 mg at bedtime which will assist with sleep.  For her anxiety: Gabapentin was increased 200 mg twice daily, Xanax was discontinued and replaced by Klonopin 1 mg twice daily.  QTc 461 by EKG 05/01/2019.  Appears stable.  No A/V hallucinations, suicidal or homicidal ideations.  Regarding hyponatremia: Unclear if this is due to increased free water intake versus SIADH from multiple psychiatric medications.  Please see management below.  Recommend continued fluid restriction between 1200-1500 mL/day.  Acute metabolic encephalopathy in the setting of mental illness\  EEG without seizures.  Seems to have resolved.  COPD  Stable without clinical bronchospasm.  Hyperlipidemia  Not on medications prior to admission.  Chronic diastolic  CHF  Compensated.  Echo this admission with preserved EF.  Lasix 20 mg daily was discontinued.  Monitor closely at SNF regarding need to reinitiate Lasix.  Obesity/Body mass index is 31.24 kg/m.  Chronic hyponatremia  Serum sodium which was 138 on 11/12 had gradually drifted down to 127 on 11/19.  Asymptomatic of same.  As per discussion with RN, increased fluid intake, not certain if this is all free water.  Does drink a lot of Diet Coke.  Fluid restriction.  Continue sodium chloride tablets.   Sodium improved to 130. Follow BMP closely at SNF.  If has recurrent worsening of hyponatremia, may consider initiation of Lasix 20 mg daily, reassessing her psychiatry polypharmacy medications which may contribute to hyponatremia.  Diet-controlled DM 2  A1c 5.2 in June 2020.  Consultants:  Neurology Psych  Procedures:  None   Discharge Instructions  Discharge Instructions    (HEART FAILURE PATIENTS) Call MD:  Anytime you have any of the following symptoms: 1) 3 pound weight gain in 24 hours or 5 pounds in 1 week 2) shortness of breath, with or without a dry hacking cough 3) swelling in the hands, feet or stomach 4) if you have to sleep on extra pillows at night in order to breathe.   Complete by: As directed    Call MD for:   Complete by: As directed    Strokelike symptoms.   Call MD for:  difficulty breathing, headache or visual disturbances   Complete by: As directed    Call MD for:  extreme fatigue   Complete by: As directed    Call MD for:  persistant dizziness or light-headedness   Complete by: As directed    Call MD for:  persistant nausea and vomiting   Complete by: As directed    Call MD for:  severe uncontrolled pain   Complete by: As directed    Call MD for:  temperature >100.4   Complete by: As directed    Diet - low sodium heart healthy   Complete by: As directed    Increase activity slowly   Complete by: As directed        Medication List    STOP  taking these medications   ALPRAZolam 0.5 MG tablet Commonly known as: XANAX   aluminum-magnesium hydroxide-simethicone [id number] MG/5ML Susp Commonly known as: MAALOX   benzonatate 100 MG capsule Commonly known as: TESSALON   buPROPion 150 MG 24 hr tablet Commonly known as: WELLBUTRIN XL   calcium carbonate 500 MG chewable tablet Commonly known as: TUMS - dosed in mg elemental calcium   furosemide 20 MG tablet Commonly known as: LASIX   guaiFENesin 600 MG 12 hr tablet Commonly known as: MUCINEX   hydrOXYzine 50 MG capsule Commonly known as: VISTARIL   ipratropium-albuterol 0.5-2.5 (3) MG/3ML Soln Commonly known as: DUONEB   methocarbamol 500 MG tablet Commonly known as: ROBAXIN   oxyCODONE 5 MG immediate release tablet Commonly known as: Oxy IR/ROXICODONE   QUEtiapine 50 MG tablet Commonly known as: SEROQUEL   Robafen 100 MG/5ML syrup Generic drug: guaifenesin     TAKE these medications   acetaminophen 325 MG tablet Commonly known as: TYLENOL Take 650 mg by mouth 2 (two) times daily as needed (pain). Do not exceed 3 grams/24 hours of tylenol from all sources What changed: Another medication with the same name was removed. Continue taking this medication, and follow the directions you  see here.   albuterol (2.5 MG/3ML) 0.083% nebulizer solution Commonly known as: PROVENTIL Take 3 mLs (2.5 mg total) by nebulization every 4 (four) hours as needed for wheezing or shortness of breath.   aspirin 81 MG tablet Take 1 tablet (81 mg total) by mouth daily. For heart health   Breo Ellipta 100-25 MCG/INH Aepb Generic drug: fluticasone furoate-vilanterol Inhale 1 puff into the lungs daily.   Calcium 600+D 600-800 MG-UNIT Tabs Generic drug: Calcium Carb-Cholecalciferol Take 1 tablet by mouth daily.   citalopram 20 MG tablet Commonly known as: CELEXA Take 1 tablet (20 mg total) by mouth at bedtime.   clonazePAM 1 MG tablet Commonly known as: KLONOPIN Take 1  tablet (1 mg total) by mouth 2 (two) times daily.   docusate sodium 100 MG capsule Commonly known as: COLACE Take 100 mg by mouth See admin instructions. Take one capsule (100 mg) by mouth every 12 hours, may also take one capsule (100 mg) twice daily as needed for constipation   feeding supplement (ENSURE ENLIVE) Liqd Take 237 mLs by mouth daily.   feeding supplement (PRO-STAT SUGAR FREE 64) Liqd Take 30 mLs by mouth 2 (two) times daily.   fluticasone 50 MCG/ACT nasal spray Commonly known as: FLONASE Place 2 sprays into both nostrils 2 (two) times daily.   gabapentin 100 MG capsule Commonly known as: NEURONTIN Take 1 capsule (100 mg total) by mouth 2 (two) times daily. What changed: when to take this   lisinopril 40 MG tablet Commonly known as: ZESTRIL Take 1 tablet (40 mg total) by mouth daily.   loratadine 10 MG tablet Commonly known as: CLARITIN Take 10 mg by mouth daily.   lurasidone 20 MG Tabs tablet Commonly known as: LATUDA Take 1 tablet (20 mg total) by mouth 2 (two) times daily. What changed:   medication strength  how much to take  when to take this   Melatonin 5 MG Tabs Take 5 mg by mouth at bedtime.   metoprolol tartrate 25 MG tablet Commonly known as: LOPRESSOR Take 1 tablet (25 mg total) by mouth 2 (two) times daily.   multivitamin with minerals Tabs tablet Take 1 tablet by mouth daily.   neomycin-bacitracin-polymyxin Oint Commonly known as: NEOSPORIN Apply 1 application topically as needed for wound care (minor skin tears or abrasians).   NIFEdipine 90 MG 24 hr tablet Commonly known as: PROCARDIA XL/NIFEDICAL-XL Take 90 mg by mouth daily.   nystatin powder Generic drug: nystatin Apply topically See admin instructions. Apply topically to reddened area twice daily as needed for irritation   OLANZapine 5 MG tablet Commonly known as: ZYPREXA Take 1 tablet (5 mg total) by mouth 2 (two) times daily.   omeprazole 40 MG capsule Commonly known  as: PRILOSEC Take 1 capsule (40 mg total) by mouth at bedtime.   polyethylene glycol 17 g packet Commonly known as: MiraLax Take 17 g by mouth daily.   sodium chloride 1 g tablet Take 1 tablet (1 g total) by mouth 3 (three) times daily with meals.   valproic acid 250 MG capsule Commonly known as: DEPAKENE Take 2 capsules (500 mg total) by mouth 2 (two) times daily.   vitamin C 500 MG tablet Commonly known as: ASCORBIC ACID Take 500 mg by mouth 2 (two) times daily.   Zinc Gluconate 100 MG Tabs Take 100 mg by mouth daily.      Allergies  Allergen Reactions  . Clarithromycin Itching  . Penicillins Itching    Has tolerated cefazolin  before  Has patient had a PCN reaction causing immediate rash, facial/tongue/throat swelling, SOB or lightheadedness with hypotension: No Has patient had a PCN reaction causing severe rash involving mucus membranes or skin necrosis: No Has patient had a PCN reaction that required hospitalization: Unknown Has patient had a PCN reaction occurring within the last 10 years: No If all of the above answers are "NO", then may proceed with Cephalosporin use.       Procedures/Studies: Ct Angio Head W Or Wo Contrast  Result Date: 05/01/2019 CLINICAL DATA:  Pure aphasia.  Dementia EXAM: CT ANGIOGRAPHY HEAD AND NECK CT PERFUSION BRAIN TECHNIQUE: Multidetector CT imaging of the head and neck was performed using the standard protocol during bolus administration of intravenous contrast. Multiplanar CT image reconstructions and MIPs were obtained to evaluate the vascular anatomy. Carotid stenosis measurements (when applicable) are obtained utilizing NASCET criteria, using the distal internal carotid diameter as the denominator. Multiphase CT imaging of the brain was performed following IV bolus contrast injection. Subsequent parametric perfusion maps were calculated using RAPID software. CONTRAST:  Doses not yet known, studies in progress. COMPARISON:  Noncontrast  head CT from earlier today FINDINGS: CTA NECK FINDINGS Aortic arch: Extensive atherosclerotic plaque. Bulky calcified plaque at the proximal left subclavian with high-grade stenosis if not occlusion. There is symmetric enhancement in the subclavians. Right carotid system: Common carotid and proximal ICA atherosclerotic calcification. No flow limiting stenosis. ICA tortuosity with nonstenotic kink. Left carotid system: Scattered atherosclerotic calcification of the common and internal carotids without flow limiting stenosis or ulceration. Vertebral arteries: Severely stenotic or occluded proximal left subclavian with robust reconstitution. 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 Upper chest: Mild dependent atelectasis in the upper lobes. Partially covered ground-glass density in the left upper lobe. Review of the MIP images confirms the above findings CTA HEAD FINDINGS Anterior circulation: Atherosclerotic plaque on the carotid siphons. No branch occlusion or proximal flow limiting stenosis. Negative for aneurysm. Atherosclerotic irregularity of bilateral medium size branches. Posterior circulation: Atherosclerotic plaque on the left V4 segment without flow limiting stenosis. The vertebrobasilar arteries are smooth and patent. No PCA branch occlusion or beading. There is a mild to moderate atheromatous type narrowing at the left P3 segment. Negative for aneurysm Venous sinuses: Negative Anatomic variants: None significant Review of the MIP images confirms the above findings CT Brain Perfusion Findings: ASPECTS: 10 CBF (<30%) Volume: 37mL Perfusion (Tmax>6.0s) volume: 71mL IMPRESSION: 1. No emergent large vessel occlusion or infarct/penumbra by CT perfusion. 2. Occlusion or pre occlusion at the proximal left subclavian due to bulky  calcified plaque. There is symmetric enhancement of the vertebral and subclavian arteries. 3. Moderate intracranial atherosclerosis without proximal flow limiting stenosis. 4. Possible left upper lobe airspace disease, suggest chest x-ray correlation. Electronically Signed   By: Monte Fantasia M.D.   On: 05/01/2019 12:04   Ct Angio Neck W Or Wo Contrast  Result Date: 05/01/2019 CLINICAL DATA:  Pure aphasia.  Dementia EXAM: CT ANGIOGRAPHY HEAD AND NECK CT PERFUSION BRAIN TECHNIQUE: Multidetector CT imaging of the head and neck was performed using the standard protocol during bolus administration of intravenous contrast. Multiplanar CT image reconstructions and MIPs were obtained to evaluate the vascular anatomy. Carotid stenosis measurements (when applicable) are obtained utilizing NASCET criteria, using the distal internal carotid diameter as the denominator. Multiphase CT imaging of the brain was performed  following IV bolus contrast injection. Subsequent parametric perfusion maps were calculated using RAPID software. CONTRAST:  Doses not yet known, studies in progress. COMPARISON:  Noncontrast head CT from earlier today FINDINGS: CTA NECK FINDINGS Aortic arch: Extensive atherosclerotic plaque. Bulky calcified plaque at the proximal left subclavian with high-grade stenosis if not occlusion. There is symmetric enhancement in the subclavians. Right carotid system: Common carotid and proximal ICA atherosclerotic calcification. No flow limiting stenosis. ICA tortuosity with nonstenotic kink. Left carotid system: Scattered atherosclerotic calcification of the common and internal carotids without flow limiting stenosis or ulceration. Vertebral arteries: Severely stenotic or occluded proximal left subclavian with robust reconstitution. 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 Upper chest: Mild dependent atelectasis in the upper lobes. Partially covered ground-glass density in the left upper lobe. Review of the MIP images confirms the above findings CTA HEAD FINDINGS Anterior circulation: Atherosclerotic plaque on the carotid siphons. No branch occlusion or proximal flow limiting stenosis. Negative for aneurysm. Atherosclerotic irregularity of bilateral medium size branches. Posterior circulation: Atherosclerotic plaque on the left V4 segment without flow limiting stenosis. The vertebrobasilar arteries are smooth and patent. No PCA branch occlusion or beading. There is a mild to moderate atheromatous type narrowing at the left P3 segment. Negative for aneurysm Venous sinuses: Negative Anatomic variants: None significant Review of the MIP images confirms the above findings CT Brain Perfusion Findings: ASPECTS: 10 CBF (<30%) Volume: 42mL Perfusion (Tmax>6.0s) volume: 78mL IMPRESSION: 1. No emergent large vessel occlusion or infarct/penumbra by CT perfusion. 2. Occlusion or pre occlusion at the proximal left subclavian due to bulky calcified plaque. There is symmetric enhancement of the vertebral and subclavian arteries. 3. Moderate intracranial atherosclerosis without proximal flow limiting stenosis. 4. Possible left upper lobe airspace disease, suggest chest x-ray correlation. Electronically Signed   By: Monte Fantasia M.D.   On: 05/01/2019 12:04   Mr Brain Wo Contrast  Result Date: 05/01/2019 CLINICAL DATA:  Stroke. EXAM: MRI HEAD WITHOUT CONTRAST TECHNIQUE: Multiplanar, multiecho pulse sequences of the brain and surrounding structures were obtained without intravenous contrast. COMPARISON:  CTA head 05/01/2019 FINDINGS: Brain: Negative for acute infarct. Small hyperintensities in the cerebral white matter bilaterally and in the central pons most consistent with mild chronic microvascular ischemia. Negative for hemorrhage or mass. Motion  degraded image quality Vascular: Normal arterial flow voids Skull and upper cervical spine: Negative Sinuses/Orbits: Mild mucosal edema paranasal sinuses. Right mastoid effusion. Bilateral cataract surgery Other: None IMPRESSION: No acute abnormality.  Mild chronic microvascular ischemic changes. Electronically Signed   By: Franchot Gallo M.D.   On: 05/01/2019 12:46   Ct Abdomen Pelvis W Contrast  Result Date: 04/21/2019 CLINICAL DATA:  Generalized abdomen pain. EXAM: CT ABDOMEN AND PELVIS WITH CONTRAST TECHNIQUE: Multidetector CT imaging of the abdomen and pelvis was performed using the standard protocol following bolus administration of intravenous contrast. CONTRAST:  173mL OMNIPAQUE IOHEXOL 300 MG/ML  SOLN COMPARISON:  March 13, 2019 FINDINGS: Lower chest: Mild atelectasis of posterior lung bases are noted. The heart size is probably enlarged. Hepatobiliary: No focal liver abnormality is seen. Status post cholecystectomy. No biliary dilatation. Pancreas: Unremarkable. No pancreatic ductal dilatation or surrounding inflammatory changes. Spleen: Normal in size without focal abnormality. Adrenals/Urinary Tract: The bilateral adrenal glands are normal. Right kidney cysts are noted. There is no hydronephrosis bilaterally. The bladder is distended. Stomach/Bowel: The stomach is normal.  There is umbilical herniation of bowel loops without evidence of incarceration or bowel obstruction. There is no diverticulitis. The appendix is not seen but no inflammation is noted around cecum. Vascular/Lymphatic: Aortic atherosclerosis. No enlarged abdominal or pelvic lymph nodes. Reproductive: The uterus is not seen. No abnormal mass is identified in the bilateral adnexa. Other: None. Musculoskeletal: Degenerative joint changes of the spine are identified. Chronic compression deformities of several vertebral bodies are unchanged. IMPRESSION: Umbilical herniation of bowel loops without evidence of incarceration or bowel  obstruction. No acute abnormality is identified in the abdomen and pelvis. Electronically Signed   By: Abelardo Diesel M.D.   On: 04/21/2019 21:24   Ct Cerebral Perfusion W Contrast  Result Date: 05/01/2019 CLINICAL DATA:  Pure aphasia.  Dementia EXAM: CT ANGIOGRAPHY HEAD AND NECK CT PERFUSION BRAIN TECHNIQUE: Multidetector CT imaging of the head and neck was performed using the standard protocol during bolus administration of intravenous contrast. Multiplanar CT image reconstructions and MIPs were obtained to evaluate the vascular anatomy. Carotid stenosis measurements (when applicable) are obtained utilizing NASCET criteria, using the distal internal carotid diameter as the denominator. Multiphase CT imaging of the brain was performed following IV bolus contrast injection. Subsequent parametric perfusion maps were calculated using RAPID software. CONTRAST:  Doses not yet known, studies in progress. COMPARISON:  Noncontrast head CT from earlier today FINDINGS: CTA NECK FINDINGS Aortic arch: Extensive atherosclerotic plaque. Bulky calcified plaque at the proximal left subclavian with high-grade stenosis if not occlusion. There is symmetric enhancement in the subclavians. Right carotid system: Common carotid and proximal ICA atherosclerotic calcification. No flow limiting stenosis. ICA tortuosity with nonstenotic kink. Left carotid system: Scattered atherosclerotic calcification of the common and internal carotids without flow limiting stenosis or ulceration. Vertebral arteries: Severely stenotic or occluded proximal left subclavian with robust reconstitution. 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 Upper chest: Mild dependent atelectasis in the upper lobes. Partially covered ground-glass density in the left upper lobe. Review of  the MIP images confirms the above findings CTA HEAD FINDINGS Anterior circulation: Atherosclerotic plaque on the carotid siphons. No branch occlusion or proximal flow limiting stenosis. Negative for aneurysm. Atherosclerotic irregularity of bilateral medium size branches. Posterior circulation: Atherosclerotic plaque on the left V4 segment without flow limiting stenosis. The vertebrobasilar arteries are smooth and patent. No PCA branch occlusion or beading. There is a mild to moderate atheromatous type narrowing at the left P3 segment. Negative for aneurysm Venous sinuses: Negative Anatomic variants: None significant Review of the MIP images confirms the above findings CT Brain Perfusion Findings: ASPECTS: 10 CBF (<30%) Volume: 31mL Perfusion (Tmax>6.0s) volume: 43mL IMPRESSION: 1. No emergent large vessel occlusion or infarct/penumbra by CT perfusion. 2. Occlusion or pre occlusion at the proximal left subclavian due to bulky calcified plaque. There is symmetric enhancement of the vertebral and subclavian arteries. 3. Moderate intracranial atherosclerosis without proximal flow limiting stenosis. 4. Possible left upper lobe airspace disease, suggest chest x-ray correlation. Electronically Signed   By: Monte Fantasia M.D.   On: 05/01/2019 12:04   Dg Abdomen Acute W/chest  Result Date: 04/21/2019 CLINICAL DATA:  Nausea and vomiting EXAM: DG ABDOMEN ACUTE W/ 1V CHEST COMPARISON:  03/13/2019 FINDINGS: Cardiac shadow is enlarged in size. Aortic calcifications are noted. Lungs are well aerated bilaterally. Previously seen left pleural effusion has resolved in the interval. Minimal left basilar atelectasis is  seen. Scattered large and small bowel gas is noted. No definitive obstructive changes are seen. Changes of prior sacroplasty are noted. Surgical repair of the proximal right femur is noted as well. No free air is seen. IMPRESSION: Minimal left basilar atelectasis. No acute abnormality noted in the abdomen.  Electronically Signed   By: Inez Catalina M.D.   On: 04/21/2019 19:48   Ct Head Code Stroke Wo Contrast  Result Date: 05/01/2019 CLINICAL DATA:  Code stroke.  Expressive aphasia EXAM: CT HEAD WITHOUT CONTRAST TECHNIQUE: Contiguous axial images were obtained from the base of the skull through the vertex without intravenous contrast. COMPARISON:  07/04/2018 FINDINGS: Brain: No evidence of acute infarction, hemorrhage, hydrocephalus, extra-axial collection or mass lesion/mass effect. Cerebral volume loss greater than expected for age Vascular: No definite hyperdensity. There is atherosclerotic calcification that is multifocal Skull: Hyperostosis interna. Sinuses/Orbits: Chronic right ear with opacified and sclerotic mastoid. Other: These results were communicated to Dr. Rory Percy at 11:48 amon 11/6/2020by text page via the [facility] messaging system. ASPECTS [facility] Stroke Program Early CT Score) - Ganglionic level infarction (caudate, lentiform nuclei, internal capsule, insula, M1-M3 cortex): 7 - Supraganglionic infarction (M4-M6 cortex): 3 Total score (0-10 with 10 being normal): 10 IMPRESSION: 1. No acute finding. 2. ASPECTS is 10. 3. Age advanced cerebral volume loss and atherosclerotic calcification. Electronically Signed   By: Monte Fantasia M.D.   On: 05/01/2019 11:51      Subjective: Patient was slightly irritable this morning because she had not gotten her breakfast.  Also demanding her anxiety medications which had yet to be served.  Otherwise no complaints.  As per RN, no acute issues noted.  Discharge Exam:  Vitals:   05/15/19 0800 05/15/19 0810 05/15/19 0917 05/15/19 1135  BP:  109/86  (!) 136/58  Pulse:  (!) 53 (!) 59 (!) 54  Resp:  16  16  Temp:  (!) 97.5 F (36.4 C)  98.1 F (36.7 C)  TempSrc:  Oral  Oral  SpO2: 95% 97%  96%  Weight:      Height:        General exam: Pleasant young female sitting up comfortably in bed. Respiratory system: Clear to auscultation.  No increased work  of breathing. Cardiovascular system: S1 & S2 heard, RRR. No JVD, murmurs, rubs, gallops or clicks. No pedal edema.   Gastrointestinal system: Abdomen is nondistended, soft and nontender. No organomegaly or masses felt. Normal bowel sounds heard. Central nervous system: Alert and oriented x2. No focal neurological deficits. Extremities: Symmetric 5 x 5 power. Skin: No rashes, lesions or ulcers Psychiatry: Judgement and insight appear impaired. Mood & affect somewhat irritable.    The results of significant diagnostics from this hospitalization (including imaging, microbiology, ancillary and laboratory) are listed below for reference.     Microbiology: Recent Results (from the past 240 hour(s))  SARS CORONAVIRUS 2 (TAT 6-24 HRS) Nasopharyngeal Nasopharyngeal Swab     Status: None   Collection Time: 05/15/19  9:21 AM   Specimen: Nasopharyngeal Swab  Result Value Ref Range Status   SARS Coronavirus 2 NEGATIVE NEGATIVE Final    Comment: (NOTE) SARS-CoV-2 target nucleic acids are NOT DETECTED. The SARS-CoV-2 RNA is generally detectable in upper and lower respiratory specimens during the acute phase of infection. Negative results do not preclude SARS-CoV-2 infection, do not rule out co-infections with other pathogens, and should not be used as the sole basis for treatment or other patient management decisions. Negative results must be combined with clinical observations, patient  history, and epidemiological information. The expected result is Negative. Fact Sheet for Patients: SugarRoll.be Fact Sheet for Healthcare Providers: https://www.woods-mathews.com/ This test is not yet approved or cleared by the [place] FDA and  has been authorized for detection and/or diagnosis of SARS-CoV-2 by FDA under an Emergency Use Authorization (EUA). This EUA will remain  in effect (meaning this test can be used) for the duration of the COVID-19 declaration  under Section 56 4(b)(1) of the Act, 21 U.S.C. section 360bbb-3(b)(1), unless the authorization is terminated or revoked sooner. Performed at [facility], [place] [address] [place], [place] [address]      Labs: CBC: Recent Labs  Lab 05/09/19 0319 05/13/19 0342  WBC 6.7 5.8  NEUTROABS 4.1 3.3  HGB 14.0 13.2  HCT 43.3 39.7  MCV 86.4 85.6  PLT 186 [id number]    Basic Metabolic Panel: Recent Labs  Lab 05/09/19 0319 05/13/19 0342 05/14/19 0505 05/15/19 0250  NA 133* 130* 127* 130*  K 4.8 4.9 4.7 4.7  CL 98 95* 93* 95*  CO2 25 26 24 25   GLUCOSE 123* 116* 106* 100*  BUN 17 29* 28* 27*  CREATININE 0.60 0.67 0.70 0.70  CALCIUM 9.4 9.0 8.5* 8.8*  MG 1.8 1.9  --   --   PHOS 5.1* 4.1  --   --     Liver Function Tests: Recent Labs  Lab 05/09/19 0319 05/13/19 0342  ALBUMIN 3.0* 2.9*    CBG: Recent Labs  Lab 05/14/19 1141 05/14/19 1629 05/14/19 2134 05/15/19 0621 05/15/19 1120  GLUCAP 104* 128* 139* 92 118*     Urinalysis    Component Value Date/Time   COLORURINE YELLOW 05/02/2019 1828   APPEARANCEUR CLEAR 05/02/2019 1828   APPEARANCEUR Clear 06/22/2012 0618   LABSPEC 1.033 (H) 05/02/2019 1828   LABSPEC 1.003 06/22/2012 0618   PHURINE 6.0 05/02/2019 1828   GLUCOSEU NEGATIVE 05/02/2019 1828   GLUCOSEU Negative 06/22/2012 0618   GLUCOSEU NEGATIVE 04/09/2011 [place] 05/02/2019 1828   BILIRUBINUR NEGATIVE 05/02/2019 1828   BILIRUBINUR Negative 06/22/2012 0618   KETONESUR 20 (A) 05/02/2019 1828   PROTEINUR NEGATIVE 05/02/2019 1828   UROBILINOGEN 1.0 08/18/2011 1805   NITRITE NEGATIVE 05/02/2019 1828   LEUKOCYTESUR NEGATIVE 05/02/2019 1828   LEUKOCYTESUR Trace 06/22/2012 0618      Time coordinating discharge: 45 minutes  SIGNED:  Vernell Leep, MD, FACP, [facility]. [facility]  To contact the attending provider between 7A-7P or the covering provider during after hours 7P-7A, please log into the web site [email or web address] and access  using universal Alex password for that web site. If you do not have the password, please call the hospital operator.

## 2019-05-15 NOTE — TOC Transition Note (Signed)
Transition of Care [facility]) - CM/SW Discharge Note   Patient Details  Name: Natalie Thomas MRN: [id number] Date of Birth: [birth year]  [facility]) CM/SW Contact:  Pollie Friar, RN Phone Number: 05/15/2019, 3:45 PM   Clinical Narrative:    Pt discharging to [facility] [place]. CM called and updated her legal guardian: Silvano Rusk with DSS.  PTAR to provide transport home. Bedside RN updated and d/c packet at the desk.   Room: 203 Number for report: [phone]   Final next level of care: Skilled Nursing Facility Barriers to Discharge: No Barriers Identified   Patient Goals and CMS Choice   CMS Medicare.gov Compare Post Acute Care list provided to:: Patient Represenative (must comment) Choice offered to / list presented to : (legal guardian)  Discharge Placement              Patient chooses bed at: [facility] Patient to be transferred to facility by: [place] Name of family member notified: Silvano Rusk Patient and family notified of of transfer: 05/15/19  Discharge Plan and Services In-house Referral: Clinical Social Work Discharge Planning Services: CM Consult Post Acute Care Choice: [place]                               Social Determinants of Health (SDOH) Interventions     Readmission Risk Interventions Readmission Risk Prevention Plan 03/10/2019 12/08/2018  Transportation Screening Complete Complete  Medication Review Press photographer) Complete Complete  PCP or Specialist appointment within 3-5 days of discharge Complete Complete  HRI or Home Care Consult Complete Complete  SW Recovery Care/Counseling Consult Complete Complete  Palliative Care Screening Not Applicable Not Applicable  Skilled Nursing Facility Complete Complete  Some recent data might be hidden

## 2019-05-15 NOTE — Progress Notes (Signed)
Gave report to [place], Therapist, sports at [facility]

## 2019-05-15 NOTE — TOC Progression Note (Signed)
Transition of Care [facility]) - Progression Note    Patient Details  Name: Natalie Thomas MRN: [id number] Date of Birth: 08-14-1960  Transition of Care [facility]) CM/SW Contact  Pollie Friar, RN Phone Number: 05/15/2019, 11:16 AM  Clinical Narrative:    [facility] has offered a bed for SNF rehab. CM reached out to patients legal guardian and she is in agreement. CM updated MD and asked for covid if d/cing in next couple of days.    Expected Discharge Plan: [place] Barriers to Discharge: Continued Medical Work up  Expected Discharge Plan and Services Expected Discharge Plan: [place] In-house Referral: Clinical Social Work Discharge Planning Services: CM Consult Post Acute Care Choice: [place] arrangements for the past 2 months: Assisted Living Facility([place])                                       Social Determinants of Health (SDOH) Interventions    Readmission Risk Interventions Readmission Risk Prevention Plan 03/10/2019 12/08/2018  Transportation Screening Complete Complete  Medication Review Press photographer) Complete Complete  PCP or Specialist appointment within 3-5 days of discharge Complete Complete  HRI or Home Care Consult Complete Complete  SW Recovery Care/Counseling Consult Complete Complete  Palliative Care Screening Not Applicable Not [place] Complete Complete  Some recent data might be hidden

## 2019-05-15 NOTE — Progress Notes (Signed)
IV and Tele removed. Patient belongings packed at bedside. Awaiting transport

## 2019-05-15 NOTE — Discharge Instructions (Signed)

## 2019-05-15 NOTE — Care Management Important Message (Signed)
Important Message  Patient Details  Name: Natalie Thomas MRN: [id number] Date of Birth: [birth year]   Medicare Important Message Given:  Yes     Evagelia Knack 05/15/2019, 2:01 PM

## 2019-06-30 ENCOUNTER — Encounter (HOSPITAL_COMMUNITY): Payer: Self-pay

## 2019-06-30 ENCOUNTER — Emergency Department (HOSPITAL_COMMUNITY): Payer: Medicare Other

## 2019-06-30 ENCOUNTER — Other Ambulatory Visit: Payer: Self-pay

## 2019-06-30 ENCOUNTER — Inpatient Hospital Stay (HOSPITAL_COMMUNITY)
Admission: EM | Admit: 2019-06-30 | Discharge: 2019-07-06 | DRG: 392 | Disposition: A | Discharge status: Nursing Home (Includes ICF) | Payer: Medicare Other | Attending: Internal Medicine | Admitting: Internal Medicine

## 2019-06-30 DIAGNOSIS — E871 Hypo-osmolality and hyponatremia: Secondary | ICD-10-CM | POA: Diagnosis present

## 2019-06-30 DIAGNOSIS — I110 Hypertensive heart disease with heart failure: Secondary | ICD-10-CM | POA: Diagnosis present

## 2019-06-30 DIAGNOSIS — Z880 Allergy status to penicillin: Secondary | ICD-10-CM

## 2019-06-30 DIAGNOSIS — K5903 Drug induced constipation: Secondary | ICD-10-CM

## 2019-06-30 DIAGNOSIS — R1084 Generalized abdominal pain: Principal | ICD-10-CM | POA: Diagnosis present

## 2019-06-30 DIAGNOSIS — K5900 Constipation, unspecified: Secondary | ICD-10-CM | POA: Diagnosis present

## 2019-06-30 DIAGNOSIS — Z20822 Contact with and (suspected) exposure to covid-19: Secondary | ICD-10-CM | POA: Diagnosis present

## 2019-06-30 DIAGNOSIS — I5030 Unspecified diastolic (congestive) heart failure: Secondary | ICD-10-CM | POA: Diagnosis present

## 2019-06-30 DIAGNOSIS — Z7901 Long term (current) use of anticoagulants: Secondary | ICD-10-CM

## 2019-06-30 DIAGNOSIS — I5032 Chronic diastolic (congestive) heart failure: Secondary | ICD-10-CM | POA: Diagnosis not present

## 2019-06-30 DIAGNOSIS — K429 Umbilical hernia without obstruction or gangrene: Secondary | ICD-10-CM | POA: Diagnosis present

## 2019-06-30 DIAGNOSIS — R109 Unspecified abdominal pain: Secondary | ICD-10-CM | POA: Diagnosis not present

## 2019-06-30 DIAGNOSIS — F17200 Nicotine dependence, unspecified, uncomplicated: Secondary | ICD-10-CM | POA: Diagnosis present

## 2019-06-30 DIAGNOSIS — I10 Essential (primary) hypertension: Secondary | ICD-10-CM | POA: Diagnosis present

## 2019-06-30 DIAGNOSIS — F419 Anxiety disorder, unspecified: Secondary | ICD-10-CM | POA: Diagnosis present

## 2019-06-30 DIAGNOSIS — D751 Secondary polycythemia: Secondary | ICD-10-CM | POA: Diagnosis present

## 2019-06-30 DIAGNOSIS — J449 Chronic obstructive pulmonary disease, unspecified: Secondary | ICD-10-CM | POA: Diagnosis present

## 2019-06-30 DIAGNOSIS — F250 Schizoaffective disorder, bipolar type: Secondary | ICD-10-CM | POA: Diagnosis present

## 2019-06-30 DIAGNOSIS — K219 Gastro-esophageal reflux disease without esophagitis: Secondary | ICD-10-CM | POA: Diagnosis present

## 2019-06-30 DIAGNOSIS — Z79899 Other long term (current) drug therapy: Secondary | ICD-10-CM

## 2019-06-30 DIAGNOSIS — F17210 Nicotine dependence, cigarettes, uncomplicated: Secondary | ICD-10-CM | POA: Diagnosis present

## 2019-06-30 DIAGNOSIS — R110 Nausea: Secondary | ICD-10-CM

## 2019-06-30 DIAGNOSIS — Z720 Tobacco use: Secondary | ICD-10-CM

## 2019-06-30 DIAGNOSIS — E669 Obesity, unspecified: Secondary | ICD-10-CM | POA: Diagnosis present

## 2019-06-30 DIAGNOSIS — E119 Type 2 diabetes mellitus without complications: Secondary | ICD-10-CM | POA: Diagnosis present

## 2019-06-30 DIAGNOSIS — I5043 Acute on chronic combined systolic (congestive) and diastolic (congestive) heart failure: Secondary | ICD-10-CM | POA: Diagnosis present

## 2019-06-30 DIAGNOSIS — E876 Hypokalemia: Secondary | ICD-10-CM | POA: Diagnosis present

## 2019-06-30 DIAGNOSIS — Z6831 Body mass index (BMI) 31.0-31.9, adult: Secondary | ICD-10-CM

## 2019-06-30 DIAGNOSIS — R531 Weakness: Secondary | ICD-10-CM

## 2019-06-30 DIAGNOSIS — E785 Hyperlipidemia, unspecified: Secondary | ICD-10-CM | POA: Diagnosis present

## 2019-06-30 LAB — CBC WITH DIFFERENTIAL/PLATELET
Abs Immature Granulocytes: 0.02 10*3/uL (ref 0.00–0.07)
Basophils Absolute: 0 10*3/uL (ref 0.0–0.1)
Basophils Relative: 0 %
Eosinophils Absolute: 0.1 10*3/uL (ref 0.0–0.5)
Eosinophils Relative: 1 %
HCT: 39.6 % (ref 36.0–46.0)
Hemoglobin: 13.3 g/dL (ref 12.0–15.0)
Immature Granulocytes: 0 %
Lymphocytes Relative: 28 %
Lymphs Abs: 1.4 10*3/uL (ref 0.7–4.0)
MCH: 30.3 pg (ref 26.0–34.0)
MCHC: 33.6 g/dL (ref 30.0–36.0)
MCV: 90.2 fL (ref 80.0–100.0)
Monocytes Absolute: 0.5 10*3/uL (ref 0.1–1.0)
Monocytes Relative: 10 %
Neutro Abs: 3.1 10*3/uL (ref 1.7–7.7)
Neutrophils Relative %: 61 %
Platelets: 175 10*3/uL (ref 150–400)
RBC: 4.39 MIL/uL (ref 3.87–5.11)
RDW: 14.3 % (ref 11.5–15.5)
WBC: 5.1 10*3/uL (ref 4.0–10.5)
nRBC: 0 % (ref 0.0–0.2)

## 2019-06-30 LAB — COMPREHENSIVE METABOLIC PANEL
ALT: 11 U/L (ref 0–44)
AST: 13 U/L — ABNORMAL LOW (ref 15–41)
Albumin: 3.7 g/dL (ref 3.5–5.0)
Alkaline Phosphatase: 96 U/L (ref 38–126)
Anion gap: 9 (ref 5–15)
BUN: 13 mg/dL (ref 6–20)
CO2: 29 mmol/L (ref 22–32)
Calcium: 9 mg/dL (ref 8.9–10.3)
Chloride: 83 mmol/L — ABNORMAL LOW (ref 98–111)
Creatinine, Ser: 0.55 mg/dL (ref 0.44–1.00)
GFR calc Af Amer: >60 mL/min (ref >60)
GFR calc non Af Amer: >60 mL/min (ref >60)
Glucose, Bld: 80 mg/dL (ref 70–99)
Potassium: 4.8 mmol/L (ref 3.5–5.1)
Sodium: 121 mmol/L — ABNORMAL LOW (ref 135–145)
Total Bilirubin: 0.6 mg/dL (ref 0.3–1.2)
Total Protein: 7.3 g/dL (ref 6.5–8.1)

## 2019-06-30 LAB — BRAIN NATRIURETIC PEPTIDE: B Natriuretic Peptide: 273.7 pg/mL — ABNORMAL HIGH (ref 0.0–100.0)

## 2019-06-30 LAB — TROPONIN I (HIGH SENSITIVITY)
Troponin I (High Sensitivity): 6 ng/L (ref <18)
Troponin I (High Sensitivity): 6 ng/L (ref <18)

## 2019-06-30 MED ORDER — MELATONIN 5 MG PO TABS
5.0000 mg | ORAL_TABLET | Freq: Every day | ORAL | Status: DC
  Administered 2019-07-01 – 2019-07-05 (×6): 5 mg via ORAL
  Filled 2019-06-30 (×6): qty 1

## 2019-06-30 MED ORDER — ENOXAPARIN SODIUM 40 MG/0.4ML ~~LOC~~ SOLN
40.0000 mg | SUBCUTANEOUS | Status: DC
  Administered 2019-07-01 – 2019-07-05 (×6): 40 mg via SUBCUTANEOUS
  Filled 2019-06-30 (×6): qty 0.4

## 2019-06-30 MED ORDER — BUPROPION HCL ER (XL) 150 MG PO TB24: 150.0000 mg | ORAL_TABLET | Freq: Every day | ORAL | Status: DC

## 2019-06-30 MED ORDER — NICOTINE 7 MG/24HR TD PT24
7.0000 mg | MEDICATED_PATCH | Freq: Every day | TRANSDERMAL | Status: DC
  Administered 2019-07-01 – 2019-07-06 (×6): 7 mg via TRANSDERMAL
  Filled 2019-06-30 (×6): qty 1

## 2019-06-30 MED ORDER — FLUTICASONE FUROATE-VILANTEROL 100-25 MCG/INH IN AEPB
1.0000 | INHALATION_SPRAY | Freq: Every day | RESPIRATORY_TRACT | Status: DC
  Administered 2019-07-02 – 2019-07-06 (×5): 1 via RESPIRATORY_TRACT
  Filled 2019-06-30 (×3): qty 28

## 2019-06-30 MED ORDER — PANTOPRAZOLE SODIUM 40 MG PO TBEC
40.0000 mg | DELAYED_RELEASE_TABLET | Freq: Every day | ORAL | Status: DC
  Administered 2019-07-01 – 2019-07-06 (×6): 40 mg via ORAL
  Filled 2019-06-30 (×6): qty 1

## 2019-06-30 MED ORDER — SENNOSIDES-DOCUSATE SODIUM 8.6-50 MG PO TABS
1.0000 | ORAL_TABLET | Freq: Two times a day (BID) | ORAL | Status: DC
  Administered 2019-07-01 – 2019-07-06 (×11): 1 via ORAL
  Filled 2019-06-30 (×11): qty 1

## 2019-06-30 MED ORDER — FUROSEMIDE 20 MG PO TABS
20.0000 mg | ORAL_TABLET | Freq: Every day | ORAL | Status: DC
  Administered 2019-07-01 – 2019-07-06 (×6): 20 mg via ORAL
  Filled 2019-06-30 (×6): qty 1

## 2019-06-30 MED ORDER — GABAPENTIN 100 MG PO CAPS: 100.0000 mg | ORAL_CAPSULE | Freq: Two times a day (BID) | ORAL | Status: DC

## 2019-06-30 MED ORDER — HYDROXYZINE HCL 50 MG PO TABS
50.0000 mg | ORAL_TABLET | Freq: Three times a day (TID) | ORAL | Status: DC
  Administered 2019-07-01 – 2019-07-06 (×17): 50 mg via ORAL
  Filled 2019-06-30 (×18): qty 1

## 2019-06-30 MED ORDER — SODIUM CHLORIDE 1 G PO TABS
1.0000 g | ORAL_TABLET | Freq: Four times a day (QID) | ORAL | Status: DC
  Administered 2019-07-01 – 2019-07-06 (×22): 1 g via ORAL
  Filled 2019-06-30 (×24): qty 1

## 2019-06-30 MED ORDER — ACETAMINOPHEN 325 MG PO TABS
650.0000 mg | ORAL_TABLET | Freq: Once | ORAL | Status: AC
  Administered 2019-06-30: 650 mg via ORAL
  Filled 2019-06-30: qty 2

## 2019-06-30 MED ORDER — POLYETHYLENE GLYCOL 3350 17 G PO PACK: 17.0000 g | PACK | Freq: Every day | ORAL | Status: DC

## 2019-06-30 MED ORDER — SODIUM CHLORIDE 0.9 % IV BOLUS
500.0000 mL | Freq: Once | INTRAVENOUS | Status: AC
  Administered 2019-06-30: 500 mL via INTRAVENOUS

## 2019-06-30 MED ORDER — MORPHINE SULFATE (PF) 2 MG/ML IV SOLN
0.5000 mg | INTRAVENOUS | Status: DC | PRN
  Administered 2019-07-02: 0.5 mg via INTRAVENOUS
  Filled 2019-06-30: qty 1

## 2019-06-30 MED ORDER — OLANZAPINE 5 MG PO TABS
5.0000 mg | ORAL_TABLET | Freq: Two times a day (BID) | ORAL | Status: DC
  Administered 2019-07-01 – 2019-07-06 (×10): 5 mg via ORAL
  Filled 2019-06-30 (×11): qty 1

## 2019-06-30 MED ORDER — OXYCODONE HCL 5 MG PO TABS
5.0000 mg | ORAL_TABLET | Freq: Two times a day (BID) | ORAL | Status: DC | PRN
  Administered 2019-07-01 – 2019-07-05 (×7): 5 mg via ORAL
  Filled 2019-06-30 (×9): qty 1

## 2019-06-30 MED ORDER — CALCIUM CARBONATE ANTACID 500 MG PO CHEW
500.0000 mg | CHEWABLE_TABLET | Freq: Two times a day (BID) | ORAL | Status: DC | PRN
  Administered 2019-07-01: 500 mg via ORAL
  Filled 2019-06-30: qty 3

## 2019-06-30 MED ORDER — VALPROIC ACID 250 MG PO CAPS
500.0000 mg | ORAL_CAPSULE | Freq: Three times a day (TID) | ORAL | Status: DC
  Administered 2019-07-01 – 2019-07-06 (×17): 500 mg via ORAL
  Filled 2019-06-30 (×17): qty 2

## 2019-06-30 MED ORDER — LURASIDONE HCL 20 MG PO TABS
20.0000 mg | ORAL_TABLET | Freq: Two times a day (BID) | ORAL | Status: DC
  Administered 2019-07-01 – 2019-07-06 (×12): 20 mg via ORAL
  Filled 2019-06-30 (×12): qty 1

## 2019-06-30 MED ORDER — BENZONATATE 100 MG PO CAPS
100.0000 mg | ORAL_CAPSULE | Freq: Three times a day (TID) | ORAL | Status: DC | PRN
  Administered 2019-07-01 (×2): 100 mg via ORAL
  Filled 2019-06-30 (×2): qty 1

## 2019-06-30 MED ORDER — METOPROLOL TARTRATE 25 MG PO TABS
25.0000 mg | ORAL_TABLET | Freq: Two times a day (BID) | ORAL | Status: DC
  Administered 2019-07-01 – 2019-07-06 (×8): 25 mg via ORAL
  Filled 2019-06-30 (×12): qty 1

## 2019-06-30 MED ORDER — OLANZAPINE 5 MG PO TABS
5.0000 mg | ORAL_TABLET | Freq: Every day | ORAL | Status: DC | PRN
  Administered 2019-07-01: 5 mg via ORAL
  Filled 2019-06-30 (×3): qty 1

## 2019-06-30 MED ORDER — IPRATROPIUM-ALBUTEROL 0.5-2.5 (3) MG/3ML IN SOLN
3.0000 mL | Freq: Three times a day (TID) | RESPIRATORY_TRACT | Status: DC
  Administered 2019-07-01 – 2019-07-06 (×15): 3 mL via RESPIRATORY_TRACT
  Filled 2019-06-30 (×16): qty 3

## 2019-06-30 MED ORDER — CITALOPRAM HYDROBROMIDE 20 MG PO TABS
20.0000 mg | ORAL_TABLET | Freq: Every day | ORAL | Status: DC
  Administered 2019-07-01 – 2019-07-05 (×6): 20 mg via ORAL
  Filled 2019-06-30 (×6): qty 1

## 2019-06-30 MED ORDER — FAMOTIDINE IN NACL 20-0.9 MG/50ML-% IV SOLN
20.0000 mg | Freq: Once | INTRAVENOUS | Status: AC
  Administered 2019-06-30: 20 mg via INTRAVENOUS
  Filled 2019-06-30: qty 50

## 2019-06-30 MED ORDER — ALUM & MAG HYDROXIDE-SIMETH 200-200-20 MG/5ML PO SUSP
30.0000 mL | Freq: Once | ORAL | Status: AC
  Administered 2019-06-30: 30 mL via ORAL
  Filled 2019-06-30: qty 30

## 2019-06-30 MED ORDER — GABAPENTIN 100 MG PO CAPS
100.0000 mg | ORAL_CAPSULE | Freq: Two times a day (BID) | ORAL | Status: DC
  Administered 2019-07-01 – 2019-07-06 (×12): 100 mg via ORAL
  Filled 2019-06-30 (×12): qty 1

## 2019-06-30 MED ORDER — ONDANSETRON HCL 4 MG/2ML IJ SOLN
4.0000 mg | Freq: Four times a day (QID) | INTRAMUSCULAR | Status: DC | PRN
  Administered 2019-07-01 – 2019-07-06 (×3): 4 mg via INTRAVENOUS
  Filled 2019-06-30 (×3): qty 2

## 2019-06-30 MED ORDER — GABAPENTIN 100 MG PO CAPS: 200.0000 mg | ORAL_CAPSULE | Freq: Two times a day (BID) | ORAL | Status: DC

## 2019-06-30 MED ORDER — ASPIRIN 81 MG PO CHEW
81.0000 mg | CHEWABLE_TABLET | Freq: Every day | ORAL | Status: DC
  Administered 2019-07-01 – 2019-07-06 (×6): 81 mg via ORAL
  Filled 2019-06-30 (×6): qty 1

## 2019-06-30 MED ORDER — CLONAZEPAM 1 MG PO TABS
1.0000 mg | ORAL_TABLET | Freq: Two times a day (BID) | ORAL | Status: DC
  Administered 2019-07-01 – 2019-07-06 (×12): 1 mg via ORAL
  Filled 2019-06-30 (×12): qty 1

## 2019-06-30 MED ORDER — ALBUTEROL SULFATE (2.5 MG/3ML) 0.083% IN NEBU: 2.5000 mg | INHALATION_SOLUTION | RESPIRATORY_TRACT | Status: DC | PRN

## 2019-06-30 MED ORDER — NIFEDIPINE ER OSMOTIC RELEASE 60 MG PO TB24
90.0000 mg | ORAL_TABLET | Freq: Every day | ORAL | Status: DC
  Administered 2019-07-01 – 2019-07-06 (×6): 90 mg via ORAL
  Filled 2019-06-30 (×6): qty 1

## 2019-06-30 MED ORDER — LISINOPRIL 20 MG PO TABS
40.0000 mg | ORAL_TABLET | Freq: Every day | ORAL | Status: DC
  Administered 2019-07-01 – 2019-07-06 (×6): 40 mg via ORAL
  Filled 2019-06-30 (×6): qty 2

## 2019-06-30 NOTE — ED Triage Notes (Signed)
Arrived by [facility] from [facility] second floor room 208. Patient arrives with c/o abdominal pain "for quite some time". EMS also reports SHOB with walking from room to stretcher, productive cough, and rhonchi at bases.

## 2019-06-30 NOTE — ED Notes (Signed)
Pt wheeled to bathroom using the sara steady. Urine sample at bedside PRN.

## 2019-06-30 NOTE — ED Provider Notes (Signed)
[facility] DEPT Provider Note   CSN: [id number] Arrival date & time: 06/30/19  1537     History Chief Complaint  Patient presents with  . Abdominal Pain    Natalie Thomas is a 59 y.o. female.  Patient presents from [facility] with a complaint of epigastric discomfort ("for a long time") that has recently started to "cut off my breathing". Patient endorses cough. No known fevers. Increasing nausea. She uses a wheelchair primarily for mobility. She rarely walks. Patient has noted weakness when attempting to transfer to and from wheelchair.  The history is provided by the patient and medical records. No language interpreter was used.  Abdominal Pain Pain location:  Generalized Pain quality: gnawing   Pain severity:  Moderate Onset quality:  Gradual Timing:  Intermittent Progression:  Waxing and waning Chronicity:  Chronic Associated symptoms: cough   Associated symptoms: no fever and no vomiting        Past Medical History:  Diagnosis Date  . Anemia 10/16/2017  . Anxiety and depression   . CHF (congestive heart failure) ([place])   . Chronic hyponatremia 04/08/2007   Qualifier: Diagnosis of  By: Marca Ancona RMA, Lucy    . Closed comminuted intertrochanteric fracture of right femur ([place]) 11/07/2017  . COPD 04/08/2007   Qualifier: Diagnosis of  By: Marca Ancona RMA, Lucy    . Depression   . Diabetes mellitus, type 2 ([place])    pt denies but states that she has been treated for DM  . Essential hypertension 04/08/2007   Qualifier: Diagnosis of  By: Marca Ancona RMA, Lucy    . GERD (gastroesophageal reflux disease)   . Hyperlipidemia 10/07/2017  . Lumbar disc disease 04/09/2011  . MENOPAUSE, PREMATURE 04/08/2007   Qualifier: Diagnosis of  By: Marca Ancona RMA, Lucy    . 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 Osteoporosis 04/08/2007   Qualifier: Diagnosis of  By: Reatha Armour, Lucy    . Polycythemia secondary to smoking  04/09/2011  . Schizoaffective disorder, bipolar type ([place]) 07/05/2011    Patient Active Problem List   Diagnosis Date Noted  . Hypertensive crisis 05/01/2019  . Abdominal pain 03/15/2019  . Pleural effusion 03/13/2019  . Atelectasis 03/13/2019  . Chronic diastolic CHF (congestive heart failure) ([place]) 03/13/2019  . Type 2 diabetes mellitus without complication ([place]) [id number]  . Urinary tract bacterial infections 03/09/2019  . Acute cystitis without hematuria   . Constipation   . Acute hypoxemic respiratory failure ([place]) 12/03/2018  . Irritability 11/09/2018  . Aggressive behavior 11/09/2018  . Agitation   . S/P right hip fracture 11/07/2017  . Closed comminuted intertrochanteric fracture of right femur ([place]) 11/07/2017  . Anxiety and depression   . Dyspnea 10/16/2017  . Anemia 10/16/2017  . COPD exacerbation ([place]) 10/07/2017  . Hyperlipidemia 10/07/2017  . Acute on chronic respiratory failure with hypoxia and hypercapnia ([place]) 10/07/2017  . Incontinence of urine 08/14/2011  . Schizoaffective disorder, bipolar type ([place]) 07/05/2011  . Cough 05/08/2011  . Lumbar disc disease 04/09/2011  . Polycythemia secondary to smoking 04/09/2011  . HIP PAIN, LEFT 12/12/2007  . 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 DM (diabetes mellitus), type 2 ([place]) 04/08/2007  . MENOPAUSE, PREMATURE 04/08/2007  . Chronic hyponatremia 04/08/2007  . Essential hypertension 04/08/2007  . COPD (chronic obstructive pulmonary disease) ([place]) 04/08/2007  . GERD 04/08/2007  . Osteoporosis 04/08/2007    Past Surgical History:  Procedure Laterality Date  .  EYE SURGERY     on left  eye, pt states that she sees bad out of the right eye and needs surgery there  . INTRAMEDULLARY (IM) NAIL INTERTROCHANTERIC Right 11/13/2017   Procedure: INTRAMEDULLARY (IM) NAIL INTERTROCHANTRIC;  Surgeon: Shona Needles, MD;  Location: [facility];  Service: Orthopedics;  Laterality: Right;  . PELVIC FRACTURE SURGERY       OB  History   No obstetric history on file.     No family history on file.  Social History   Tobacco Use  . Smoking status: Current Every Day Smoker    Packs/day: 0.25    Types: Cigarettes  . Smokeless tobacco: Never Used  Substance Use Topics  . Alcohol use: No  . Drug use: No    Home Medications Prior to Admission medications   Medication Sig Start Date End Date Taking? Authorizing Provider  acetaminophen (TYLENOL) 325 MG tablet Take 650 mg by mouth 2 (two) times daily as needed (pain). Do not exceed 3 grams/24 hours of tylenol from all sources    [provider]  albuterol (PROVENTIL) (2.5 MG/3ML) 0.083% nebulizer solution Take 3 mLs (2.5 mg total) by nebulization every 4 (four) hours as needed for wheezing or shortness of breath. 12/11/18   Swayze, Ava, DO  Amino Acids-Protein Hydrolys (FEEDING SUPPLEMENT, PRO-STAT SUGAR FREE 64,) LIQD Take 30 mLs by mouth 2 (two) times daily. 03/14/19   Desiree Hane, MD  aspirin 81 MG tablet Take 1 tablet (81 mg total) by mouth daily. For heart health 08/06/11   Greig Castilla, FNP  Calcium Carb-Cholecalciferol (CALCIUM 600+D) 600-800 MG-UNIT TABS Take 1 tablet by mouth daily.    [provider]  citalopram (CELEXA) 20 MG tablet Take 1 tablet (20 mg total) by mouth at bedtime. 05/15/19   Hongalgi, Lenis Dickinson, MD  clonazePAM (KLONOPIN) 1 MG tablet Take 1 tablet (1 mg total) by mouth 2 (two) times daily. 05/15/19   Hongalgi, Lenis Dickinson, MD  docusate sodium (COLACE) 100 MG capsule Take 100 mg by mouth See admin instructions. Take one capsule (100 mg) by mouth every 12 hours, may also take one capsule (100 mg) twice daily as needed for constipation    [provider]  feeding supplement, ENSURE ENLIVE, (ENSURE ENLIVE) LIQD Take 237 mLs by mouth daily. 03/14/19   Desiree Hane, MD  fluticasone (FLONASE) 50 MCG/ACT nasal spray Place 2 sprays into both nostrils 2 (two) times daily.    [provider]  fluticasone  furoate-vilanterol (BREO ELLIPTA) 100-25 MCG/INH AEPB Inhale 1 puff into the lungs daily.    [provider]  gabapentin (NEURONTIN) 100 MG capsule Take 1 capsule (100 mg total) by mouth 2 (two) times daily. 05/15/19   Hongalgi, Lenis Dickinson, MD  lisinopril (ZESTRIL) 40 MG tablet Take 1 tablet (40 mg total) by mouth daily. 04/25/19 05/25/19  [place] Donnamarie Poag, DO  loratadine (CLARITIN) 10 MG tablet Take 10 mg by mouth daily.    [provider]  lurasidone (LATUDA) 20 MG TABS tablet Take 1 tablet (20 mg total) by mouth 2 (two) times daily. 05/15/19   Hongalgi, Lenis Dickinson, MD  Melatonin 5 MG TABS Take 5 mg by mouth at bedtime.    [provider]  metoprolol tartrate (LOPRESSOR) 25 MG tablet Take 1 tablet (25 mg total) by mouth 2 (two) times daily. 12/17/18   Kayleen Memos, DO  Multiple Vitamin (MULTIVITAMIN WITH MINERALS) TABS tablet Take 1 tablet by mouth daily. 03/15/19  Desiree Hane, MD  neomycin-bacitracin-polymyxin (NEOSPORIN) OINT Apply 1 application topically as needed for wound care (minor skin tears or abrasians).    [provider]  NIFEdipine (PROCARDIA XL/ADALAT-CC) 90 MG 24 hr tablet Take 90 mg by mouth daily.    [provider]  nystatin (NYSTATIN) powder Apply topically See admin instructions. Apply topically to reddened area twice daily as needed for irritation    [provider]  OLANZapine (ZYPREXA) 5 MG tablet Take 1 tablet (5 mg total) by mouth 2 (two) times daily. 05/15/19   Hongalgi, Lenis Dickinson, MD  omeprazole (PRILOSEC) 40 MG capsule Take 1 capsule (40 mg total) by mouth at bedtime. 05/15/19   Hongalgi, Lenis Dickinson, MD  polyethylene glycol (MIRALAX) 17 g packet Take 17 g by mouth daily. 12/11/18   Swayze, Ava, DO  sodium chloride 1 g tablet Take 1 tablet (1 g total) by mouth 3 (three) times daily with meals. 12/17/18   Kayleen Memos, DO  valproic acid (DEPAKENE) 250 MG capsule Take 2 capsules (500 mg total) by mouth 2 (two) times daily.  12/11/18   Swayze, Ava, DO  vitamin C (ASCORBIC ACID) 500 MG tablet Take 500 mg by mouth 2 (two) times daily.    [provider]  Zinc Gluconate 100 MG TABS Take 100 mg by mouth daily.    [provider]    Allergies    Clarithromycin and Penicillins  Review of Systems   Review of Systems  Constitutional: Negative for fever.  Respiratory: Positive for cough.   Gastrointestinal: Positive for abdominal pain. Negative for vomiting.  All other systems reviewed and are negative.   Physical Exam Updated Vital Signs SpO2 97%   Physical Exam Vitals and nursing note reviewed.  HENT:     Head: Normocephalic.     Mouth/Throat:     Mouth: Mucous membranes are moist.  Eyes:     Conjunctiva/sclera: Conjunctivae normal.  Pulmonary:     Effort: Pulmonary effort is normal.  Chest:     Chest wall: No tenderness.  Abdominal:     General: Abdomen is protuberant.     Palpations: Abdomen is soft.     Tenderness: There is abdominal tenderness in the epigastric area.     Hernia: A hernia is present. Hernia is present in the umbilical area.  Musculoskeletal:        General: Normal range of motion.     Cervical back: Neck supple.  Skin:    General: Skin is warm and dry.  Neurological:     Mental Status: She is alert and oriented to person, place, and time.  Psychiatric:        Mood and Affect: Mood normal.     ED Results / Procedures / Treatments   Labs (all labs ordered are listed, but only abnormal results are displayed) Labs Reviewed  COMPREHENSIVE METABOLIC PANEL - Abnormal; Notable for the following components:      Result Value   Sodium 121 (*)    Chloride 83 (*)    AST 13 (*)    All other components within normal limits  BRAIN NATRIURETIC PEPTIDE - Abnormal; Notable for the following components:   B Natriuretic Peptide 273.7 (*)    All other components within normal limits  CBC WITH DIFFERENTIAL/PLATELET  TROPONIN I (HIGH SENSITIVITY)  TROPONIN I (HIGH  SENSITIVITY)    EKG None  Radiology DG Abdomen Acute W/Chest  Result Date: 06/30/2019 CLINICAL DATA:  Abdominal pain EXAM: DG ABDOMEN  ACUTE W/ 1V CHEST COMPARISON:  None. FINDINGS: There is no evidence of dilated bowel loops or free intraperitoneal air. Moderate stool burden. Cholecystectomy clips. Mild chronic interstitial prominence.  Stable cardiomegaly. Bilateral sacral plasty.  Partially imaged right femur fixation. IMPRESSION: No acute process. Electronically Signed   By: Macy Mis M.D.   On: 06/30/2019 17:19    Procedures Procedures (including critical care time)  Medications Ordered in ED Medications - No data to display  ED Course  I have reviewed the triage vital signs and the nursing notes.  Pertinent labs & imaging results that were available during my care of the patient were reviewed by me and considered in my medical decision making (see chart for details).    MDM Rules/Calculators/A&P                      Patient with numerous admissions for hypokalemia. She is noted to be hypokalemic today (121) with nausea and weakness. She also reports epigastric discomfort. LFT's normal. Gall bladder has been removed. Previous CT's of abdomen reviewed (most recently 04/21/19). Known umbilical hernia.  Will request admission for hyponatremia.     Final Clinical Impression(s) / ED Diagnoses Final diagnoses:  Hyponatremia  Nausea  Weakness    Rx / DC Orders ED Discharge Orders    None       Etta Quill, NP 06/30/19 [id number]    Davonna Belling, MD 06/30/19 [phone]

## 2019-06-30 NOTE — H&P (Signed)
History and Physical    [name] [id number] DOB: 28-Jun-1960 DOA: 06/30/2019  PCP: System, Pcp Not In  Patient coming from: [facility]  I have personally briefly reviewed patient's old medical records in [place]  Chief Complaint: abdominal pain  HPI: Natalie Thomas is a 59 y.o. female with medical history significant of bipolar/schizoaffective disorder, HTN, HLD, DM, COPD on 2L, chronic hyponatremia and diastolic CHF who presents with concerns of nausea, abdominal pain for the past few days.  Pt notes diffuse abdominal pain but appears worse on epigastric area. Also thinks it could be due to her umbilical hernia. She reports gagging every morning and then her abdomen would hurt all day. She is unable to describe the characteristic of the pain. Not been able to eat much. Not sure if food makes it worse. Had not had a "good" bowel movement. Last bowel movement was earlier today. No dysuria or increase frequency.  No chest pain or shortness of breath. Has chronic cough with productive sputum.   ED Course:  She was afebrile, hypertensive up tp 198/66 on home 2L via [place].  CBC unremarkable.  Sodium of 121, K of 4.8, Creatinine of 0.55, AST of 13, ALT of 11 Troponin flat at 6.  BNP of 273.   She received Tylenol, pepcid and Maalox in the ED.   Review of Systems:  Constitutional: No Weight Change, No Fever ENT/Mouth: No sore throat, No Rhinorrhea Eyes:  No Vision Changes Cardiovascular: No Chest Pain, no SOB Respiratory: + Cough, + Sputum, Gastrointestinal: + Nausea, No Vomiting, No Diarrhea, + Constipation, + Pain Genitourinary: no dysuria Musculoskeletal: No Arthralgias, No Myalgias Skin: No Skin Lesions, No Pruritus, Neuro: no Weakness, No Numbness Psych: No Anxiety/Panic, No Depression, no decrease appetite Heme/Lymph: No Bruising, No Bleeding labs  Past Medical History:  Diagnosis Date  . Anemia 10/16/2017  . Anxiety and depression   .  CHF (congestive heart failure) ([place])   . Chronic hyponatremia 04/08/2007   Qualifier: Diagnosis of  By: Marca Ancona RMA, Lucy    . Closed comminuted intertrochanteric fracture of right femur ([place]) 11/07/2017  . COPD 04/08/2007   Qualifier: Diagnosis of  By: Marca Ancona RMA, Lucy    . Depression   . Diabetes mellitus, type 2 ([place])    pt denies but states that she has been treated for DM  . Essential hypertension 04/08/2007   Qualifier: Diagnosis of  By: Marca Ancona RMA, Lucy    . GERD (gastroesophageal reflux disease)   . Hyperlipidemia 10/07/2017  . Lumbar disc disease 04/09/2011  . MENOPAUSE, PREMATURE 04/08/2007   Qualifier: Diagnosis of  By: Marca Ancona RMA, Lucy    . 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 Osteoporosis 04/08/2007   Qualifier: Diagnosis of  By: Reatha Armour, Lucy    . Polycythemia secondary to smoking 04/09/2011  . Schizoaffective disorder, bipolar type ([place]) 07/05/2011    Past Surgical History:  Procedure Laterality Date  . EYE SURGERY     on left  eye, pt states that she sees bad out of the right eye and needs surgery there  . INTRAMEDULLARY (IM) NAIL INTERTROCHANTERIC Right 11/13/2017   Procedure: INTRAMEDULLARY (IM) NAIL INTERTROCHANTRIC;  Surgeon: Shona Needles, MD;  Location: [facility];  Service: Orthopedics;  Laterality: Right;  . PELVIC FRACTURE SURGERY       reports that she has been smoking cigarettes. She has been smoking about 0.25 packs per  day. She has never used smokeless tobacco. She reports that she does not drink alcohol or use drugs.  Allergies  Allergen Reactions  . Clarithromycin Itching  . Penicillins Itching    Has tolerated cefazolin before  Has patient had a PCN reaction causing immediate rash, facial/tongue/throat swelling, SOB or lightheadedness with hypotension: No Has patient had a PCN reaction causing severe rash involving mucus membranes or skin necrosis: No Has patient had a PCN reaction that required  hospitalization: Unknown Has patient had a PCN reaction occurring within the last 10 years: No If all of the above answers are "NO", then may proceed with Cephalosporin use.     No family history on file.   Prior to Admission medications   Medication Sig Start Date End Date Taking? Authorizing Provider  acetaminophen (TYLENOL) 325 MG tablet Take 325 mg by mouth every 6 (six) hours as needed for fever. Not to exceed 2000MG    Yes [provider]  acetaminophen (TYLENOL) 500 MG tablet Take 1,000 mg by mouth every 8 (eight) hours as needed for mild pain or moderate pain. Not to exceed 3g/24h of tylenol from all sources.   Yes [provider]  albuterol (PROVENTIL) (2.5 MG/3ML) 0.083% nebulizer solution Take 3 mLs (2.5 mg total) by nebulization every 4 (four) hours as needed for wheezing or shortness of breath. 12/11/18  Yes Swayze, Ava, DO  Alum & Mag Hydroxide-Simeth (ANTACID LIQUID PO) Take 30 mLs by mouth every 6 (six) hours as needed (heartburn).   Yes [provider]  Amino Acids-Protein Hydrolys (FEEDING SUPPLEMENT, PRO-STAT SUGAR FREE 64,) LIQD Take 30 mLs by mouth 2 (two) times daily. 03/14/19  Yes Oretha Milch D, MD  aspirin 81 MG chewable tablet Chew 81 mg by mouth daily.   Yes [provider]  benzonatate (TESSALON) 100 MG capsule Take 100 mg by mouth every 8 (eight) hours as needed for cough.   Yes [provider]  buPROPion (WELLBUTRIN XL) 150 MG 24 hr tablet Take 150 mg by mouth daily.   Yes [provider]  Calcium Carb-Cholecalciferol (CALCIUM 600+D) 600-800 MG-UNIT TABS Take 1 tablet by mouth daily.   Yes [provider]  Calcium Carbonate 500 MG CHEW Chew 500 mg by mouth every 12 (twelve) hours as needed (indigestion).   Yes [provider]  citalopram (CELEXA) 20 MG tablet Take 1 tablet (20 mg total) by mouth at bedtime. 05/15/19  Yes Hongalgi, Lenis Dickinson, MD  clonazePAM (KLONOPIN) 0.5 MG tablet Take 0.5 mg by  mouth 2 (two) times daily as needed for anxiety.   Yes [provider]  clonazePAM (KLONOPIN) 1 MG tablet Take 1 tablet (1 mg total) by mouth 2 (two) times daily. Patient taking differently: Take 1 mg by mouth 3 (three) times daily.  05/15/19  Yes Hongalgi, Lenis Dickinson, MD  demeclocycline (DECLOMYCIN) 150 MG tablet Take 150 mg by mouth 2 (two) times daily.   Yes [provider]  docusate sodium (COLACE) 100 MG capsule Take 100 mg by mouth See admin instructions. Take one capsule (100 mg) by mouth every 12 hours, may also take one capsule (100 mg) twice daily as needed for constipation   Yes [provider]  feeding supplement, ENSURE ENLIVE, (ENSURE ENLIVE) LIQD Take 237 mLs by mouth daily. 03/14/19  Yes Desiree Hane, MD  fluticasone (FLONASE) 50 MCG/ACT nasal spray Place 2 sprays into both nostrils 2 (two) times daily.   Yes [provider]  fluticasone furoate-vilanterol (BREO ELLIPTA) 100-25  MCG/INH AEPB Inhale 1 puff into the lungs daily.   Yes [provider]  furosemide (LASIX) 20 MG tablet Take 20 mg by mouth.   Yes [provider]  gabapentin (NEURONTIN) 100 MG capsule Take 1 capsule (100 mg total) by mouth 2 (two) times daily. 05/15/19  Yes Hongalgi, Lenis Dickinson, MD  guaiFENesin (MUCINEX) 600 MG 12 hr tablet Take 1,200 mg by mouth daily.   Yes [provider]  guaiFENesin (ROBITUSSIN) 100 MG/5ML liquid Take 200 mg by mouth every 8 (eight) hours as needed for cough.   Yes [provider]  hydrOXYzine (VISTARIL) 50 MG capsule Take 50 mg by mouth 3 (three) times daily.   Yes [provider]  ipratropium-albuterol (DUONEB) 0.5-2.5 (3) MG/3ML SOLN Take 3 mLs by nebulization 3 (three) times daily.   Yes [provider]  lisinopril (ZESTRIL) 40 MG tablet Take 1 tablet (40 mg total) by mouth daily. 04/25/19 06/29/20 Yes [place] Eric J, DO  loratadine (CLARITIN) 10 MG tablet Take 10 mg by mouth daily.   Yes [provider]  lurasidone (LATUDA) 20 MG TABS tablet Take 1 tablet (20 mg total) by mouth 2 (two) times daily. 05/15/19  Yes Hongalgi, Lenis Dickinson, MD  Melatonin 5 MG TABS Take 5 mg by mouth at bedtime.   Yes [provider]  methocarbamol (ROBAXIN) 500 MG tablet Take 500 mg by mouth daily.   Yes [provider]  metoprolol tartrate (LOPRESSOR) 25 MG tablet Take 1 tablet (25 mg total) by mouth 2 (two) times daily. 12/17/18  Yes Kayleen Memos, DO  Multiple Vitamin (MULTIVITAMIN WITH MINERALS) TABS tablet Take 1 tablet by mouth daily. 03/15/19  Yes Oretha Milch D, MD  neomycin-bacitracin-polymyxin (NEOSPORIN) ophthalmic ointment 1 application as needed (minor skin tears or abrasions).   Yes [provider]  nicotine (NICODERM CQ - DOSED IN MG/24 HR) 7 mg/24hr patch Place 7 mg onto the skin daily.   Yes [provider]  NIFEdipine (PROCARDIA XL/ADALAT-CC) 90 MG 24 hr tablet Take 90 mg by mouth daily.   Yes [provider]  nystatin (NYSTATIN) powder Apply 1 application topically 2 (two) times daily as needed (irritation). Apply topically to reddened area twice daily as needed for irritation   Yes [provider]  OLANZapine (ZYPREXA) 5 MG tablet Take 1 tablet (5 mg total) by mouth 2 (two) times daily. 05/15/19  Yes Hongalgi, Lenis Dickinson, MD  OLANZapine (ZYPREXA) 5 MG tablet Take 5 mg by mouth daily as needed (psychotic agitation).   Yes [provider]  omeprazole (PRILOSEC) 40 MG capsule Take 1 capsule (40 mg total) by mouth at bedtime. 05/15/19  Yes Hongalgi, Lenis Dickinson, MD  ondansetron (ZOFRAN) 8 MG tablet Take 8 mg by mouth every 6 (six) hours as needed for nausea or vomiting.   Yes [provider]  oxyCODONE (OXY IR/ROXICODONE) 5 MG immediate release tablet Take 5 mg by mouth 2 (two) times daily as needed for severe pain.   Yes [provider]  polyethylene glycol (MIRALAX) 17 g packet Take 17 g by mouth daily. Patient taking  differently: Take 17 g by mouth daily. Mix 1 capful (17 g) in 4-8 oz of water or juice and give by mouth once daily for bowel regimen 12/11/18  Yes Swayze, Ava, DO  sodium chloride (OCEAN) 0.65 % SOLN nasal spray Place 2 sprays into both nostrils every 2 (two) hours as needed (dryness, irritation).   Yes [provider]  sodium  chloride 1 g tablet Take 1 tablet (1 g total) by mouth 3 (three) times daily with meals. Patient taking differently: Take 1 g by mouth 4 (four) times daily.  12/17/18  Yes Kayleen Memos, DO  valproic acid (DEPAKENE) 250 MG capsule Take 2 capsules (500 mg total) by mouth 2 (two) times daily. Patient taking differently: Take 500 mg by mouth 3 (three) times daily.  12/11/18  Yes Swayze, Ava, DO  Zinc Gluconate 100 MG TABS Take 100 mg by mouth daily.   Yes [provider]  aspirin 81 MG tablet Take 1 tablet (81 mg total) by mouth daily. For heart health 08/06/11   Greig Castilla, FNP  loperamide (IMODIUM A-D) 2 MG tablet Take 2 mg by mouth every 6 (six) hours as needed for diarrhea or loose stools. 06/28/19   [provider]    Physical Exam: Vitals:   06/30/19 1551 06/30/19 1632 06/30/19 2134  BP:  (!) 161/72 (!) 198/66  Pulse:  62 61  Resp:  19 20  Temp:  98.7 F (37.1 C) 98.3 F (36.8 C)  TempSrc:  Oral Oral  SpO2: 97% 99% 95%    Constitutional: NAD, calm, comfortable, nontoxic appearing female laying in bed Vitals:   06/30/19 1551 06/30/19 1632 06/30/19 2134  BP:  (!) 161/72 (!) 198/66  Pulse:  62 61  Resp:  19 20  Temp:  98.7 F (37.1 C) 98.3 F (36.8 C)  TempSrc:  Oral Oral  SpO2: 97% 99% 95%   Eyes: PERRL, lids and conjunctivae normal. Wears glasses ENMT: Mucous membranes are moist.  Neck: normal, supple Respiratory: Poor aeration throughout no wheezing, no crackles. Normal respiratory effort on 2 L via nasal cannula. No accessory muscle use.  Cardiovascular: Regular rate and rhythm, no murmurs / rubs / gallops. No extremity  edema. 2+ pedal pulses. No carotid bruits.  Abdomen: no tenderness, easily reducible umbilical hernia. bowel sounds positive.  Musculoskeletal: no clubbing / cyanosis. No joint deformity upper and lower extremities. Good ROM, no contractures. Normal muscle tone.  Skin: no rashes, lesions, ulcers. No induration Neurologic: CN 2-12 grossly intact. Sensation intact.  Strength 4/5 in all 4.  Psychiatric: Normal judgment and insight. Alert and oriented x 3. Pt was irritated and frustrated.     Labs on Admission: I have personally reviewed following labs and imaging studies  CBC: Recent Labs  Lab 06/30/19 1720  WBC 5.1  NEUTROABS 3.1  HGB 13.3  HCT 39.6  MCV 90.2  PLT 0000000   Basic Metabolic Panel: Recent Labs  Lab 06/30/19 1720  NA 121*  K 4.8  CL 83*  CO2 29  GLUCOSE 80  BUN 13  CREATININE 0.55  CALCIUM 9.0   GFR: CrCl cannot be calculated (Unknown ideal weight.). Liver Function Tests: Recent Labs  Lab 06/30/19 1720  AST 13*  ALT 11  ALKPHOS 96  BILITOT 0.6  PROT 7.3  ALBUMIN 3.7   No results for input(s): LIPASE, AMYLASE in the last 168 hours. No results for input(s): AMMONIA in the last 168 hours. Coagulation Profile: No results for input(s): INR, PROTIME in the last 168 hours. Cardiac Enzymes: No results for input(s): CKTOTAL, CKMB, CKMBINDEX, TROPONINI in the last 168 hours. BNP (last 3 results) No results for input(s): PROBNP in the last 8760 hours. HbA1C: No results for input(s): HGBA1C in the last 72 hours. CBG: No results for input(s): GLUCAP in the last 168 hours. Lipid Profile: No results for input(s): CHOL, HDL, LDLCALC, TRIG,  CHOLHDL, LDLDIRECT in the last 72 hours. Thyroid Function Tests: No results for input(s): TSH, T4TOTAL, FREET4, T3FREE, THYROIDAB in the last 72 hours. Anemia Panel: No results for input(s): VITAMINB12, FOLATE, FERRITIN, TIBC, IRON, RETICCTPCT in the last 72 hours. Urine analysis:    Component Value Date/Time    COLORURINE YELLOW 05/02/2019 1828   APPEARANCEUR CLEAR 05/02/2019 1828   APPEARANCEUR Clear 06/22/2012 0618   LABSPEC 1.033 (H) 05/02/2019 1828   LABSPEC 1.003 06/22/2012 0618   PHURINE 6.0 05/02/2019 1828   GLUCOSEU NEGATIVE 05/02/2019 1828   GLUCOSEU Negative 06/22/2012 0618   GLUCOSEU NEGATIVE 04/09/2011 1058   HGBUR NEGATIVE 05/02/2019 1828   BILIRUBINUR NEGATIVE 05/02/2019 1828   BILIRUBINUR Negative 06/22/2012 0618   KETONESUR 20 (A) 05/02/2019 1828   PROTEINUR NEGATIVE 05/02/2019 1828   UROBILINOGEN 1.0 08/18/2011 1805   NITRITE NEGATIVE 05/02/2019 1828   LEUKOCYTESUR NEGATIVE 05/02/2019 1828   LEUKOCYTESUR Trace 06/22/2012 0618    Radiological Exams on Admission: DG Abdomen Acute W/Chest  Result Date: 06/30/2019 CLINICAL DATA:  Abdominal pain EXAM: DG ABDOMEN ACUTE W/ 1V CHEST COMPARISON:  None. FINDINGS: There is no evidence of dilated bowel loops or free intraperitoneal air. Moderate stool burden. Cholecystectomy clips. Mild chronic interstitial prominence.  Stable cardiomegaly. Bilateral sacral plasty.  Partially imaged right femur fixation. IMPRESSION: No acute process. Electronically Signed   By: Macy Mis M.D.   On: 06/30/2019 17:19    EKG: Independently reviewed.   Assessment/Plan  Abdominal pain/nausea Hx of chronic constipation/GERD. Recently had negative CT abdomen at the end of Oct.  Abdominal X-ray shows moderate stool burden Suspect this could be from her chronic issues and polypharmacy-she is on a significant amount of medication that could cause GI side effects Received Maloxx and pepcid in the ED PRN low dose morphine for pain PRN Zofran for nausea  Constipation continue miralax add senna  Acute on chronic Hyponatremia  121 on admit. Appears 130 at baseline.  Per previous admission, appears to be concerns of polydipsia. Psych medication also could be contributory.  Has already received 500cc bolus in the ER. Will enforce fluid restriction for  now to 1.5L and follow repeat lab.  Urine studies  Schizoaffective disorder/bipolar disorder/anxiety continue Celexa, vistaril, latuda, Zyprexa Pt had numerous medication changes during her last admission in Nov 2020  COPD reports chronically on 2L  continue duoneb, Breo Ellipta, Albuterol  Chronic diastolic CHF Appears to be compensated continue metoprolol, Lasix, lisinopril, procardia  Hypertension Elevated Continue home meds  Tobacco use continue daily nicotine patch  Obesity body mass index is 31.24 kg/m   DVT prophylaxis:.Lovenox Code Status: Full Family Communication: Plan discussed with patient at bedside  disposition Plan: Home with observation Consults called:  Admission status: Observation  Leolia Vinzant T Jaidah Lomax DO [facility]   If 7PM-7AM, please contact night-coverage [email or web address] Password [email or web address]  06/30/2019, 10:00 PM

## 2019-07-01 ENCOUNTER — Inpatient Hospital Stay (HOSPITAL_COMMUNITY): Payer: Medicare Other

## 2019-07-01 ENCOUNTER — Encounter (HOSPITAL_COMMUNITY): Payer: Self-pay | Admitting: Family Medicine

## 2019-07-01 DIAGNOSIS — K219 Gastro-esophageal reflux disease without esophagitis: Secondary | ICD-10-CM | POA: Diagnosis present

## 2019-07-01 DIAGNOSIS — J42 Unspecified chronic bronchitis: Secondary | ICD-10-CM

## 2019-07-01 DIAGNOSIS — Z7901 Long term (current) use of anticoagulants: Secondary | ICD-10-CM | POA: Diagnosis not present

## 2019-07-01 DIAGNOSIS — E785 Hyperlipidemia, unspecified: Secondary | ICD-10-CM | POA: Diagnosis present

## 2019-07-01 DIAGNOSIS — E876 Hypokalemia: Secondary | ICD-10-CM | POA: Diagnosis present

## 2019-07-01 DIAGNOSIS — F17200 Nicotine dependence, unspecified, uncomplicated: Secondary | ICD-10-CM | POA: Diagnosis present

## 2019-07-01 DIAGNOSIS — D751 Secondary polycythemia: Secondary | ICD-10-CM | POA: Diagnosis present

## 2019-07-01 DIAGNOSIS — I5032 Chronic diastolic (congestive) heart failure: Secondary | ICD-10-CM

## 2019-07-01 DIAGNOSIS — Z20822 Contact with and (suspected) exposure to covid-19: Secondary | ICD-10-CM | POA: Diagnosis present

## 2019-07-01 DIAGNOSIS — J449 Chronic obstructive pulmonary disease, unspecified: Secondary | ICD-10-CM | POA: Diagnosis present

## 2019-07-01 DIAGNOSIS — F419 Anxiety disorder, unspecified: Secondary | ICD-10-CM | POA: Diagnosis present

## 2019-07-01 DIAGNOSIS — E871 Hypo-osmolality and hyponatremia: Secondary | ICD-10-CM | POA: Diagnosis present

## 2019-07-01 DIAGNOSIS — K5900 Constipation, unspecified: Secondary | ICD-10-CM

## 2019-07-01 DIAGNOSIS — F17210 Nicotine dependence, cigarettes, uncomplicated: Secondary | ICD-10-CM | POA: Diagnosis present

## 2019-07-01 DIAGNOSIS — R109 Unspecified abdominal pain: Secondary | ICD-10-CM | POA: Diagnosis present

## 2019-07-01 DIAGNOSIS — E119 Type 2 diabetes mellitus without complications: Secondary | ICD-10-CM | POA: Diagnosis present

## 2019-07-01 DIAGNOSIS — R1084 Generalized abdominal pain: Principal | ICD-10-CM

## 2019-07-01 DIAGNOSIS — I110 Hypertensive heart disease with heart failure: Secondary | ICD-10-CM | POA: Diagnosis present

## 2019-07-01 DIAGNOSIS — I5030 Unspecified diastolic (congestive) heart failure: Secondary | ICD-10-CM | POA: Diagnosis present

## 2019-07-01 DIAGNOSIS — F250 Schizoaffective disorder, bipolar type: Secondary | ICD-10-CM | POA: Diagnosis present

## 2019-07-01 DIAGNOSIS — Z79899 Other long term (current) drug therapy: Secondary | ICD-10-CM | POA: Diagnosis not present

## 2019-07-01 DIAGNOSIS — Z6831 Body mass index (BMI) 31.0-31.9, adult: Secondary | ICD-10-CM | POA: Diagnosis not present

## 2019-07-01 DIAGNOSIS — I10 Essential (primary) hypertension: Secondary | ICD-10-CM

## 2019-07-01 DIAGNOSIS — E669 Obesity, unspecified: Secondary | ICD-10-CM | POA: Diagnosis present

## 2019-07-01 DIAGNOSIS — K429 Umbilical hernia without obstruction or gangrene: Secondary | ICD-10-CM | POA: Diagnosis present

## 2019-07-01 DIAGNOSIS — Z880 Allergy status to penicillin: Secondary | ICD-10-CM | POA: Diagnosis not present

## 2019-07-01 LAB — CBC
HCT: 37.2 % (ref 36.0–46.0)
Hemoglobin: 12.1 g/dL (ref 12.0–15.0)
MCH: 30 pg (ref 26.0–34.0)
MCHC: 32.5 g/dL (ref 30.0–36.0)
MCV: 92.1 fL (ref 80.0–100.0)
Platelets: 175 10*3/uL (ref 150–400)
RBC: 4.04 MIL/uL (ref 3.87–5.11)
RDW: 14.5 % (ref 11.5–15.5)
WBC: 4.4 10*3/uL (ref 4.0–10.5)
nRBC: 0 % (ref 0.0–0.2)

## 2019-07-01 LAB — OSMOLALITY: Osmolality: 260 mOsm/kg — ABNORMAL LOW (ref 275–295)

## 2019-07-01 LAB — BASIC METABOLIC PANEL
Anion gap: 9 (ref 5–15)
BUN: 12 mg/dL (ref 6–20)
CO2: 28 mmol/L (ref 22–32)
Calcium: 8.5 mg/dL — ABNORMAL LOW (ref 8.9–10.3)
Chloride: 89 mmol/L — ABNORMAL LOW (ref 98–111)
Creatinine, Ser: 0.52 mg/dL (ref 0.44–1.00)
GFR calc Af Amer: >60 mL/min (ref >60)
GFR calc non Af Amer: >60 mL/min (ref >60)
Glucose, Bld: 79 mg/dL (ref 70–99)
Potassium: 4.6 mmol/L (ref 3.5–5.1)
Sodium: 126 mmol/L — ABNORMAL LOW (ref 135–145)

## 2019-07-01 LAB — SODIUM, URINE, RANDOM: Sodium, Ur: 49 mmol/L

## 2019-07-01 LAB — OSMOLALITY, URINE: Osmolality, Ur: 143 mOsm/kg — ABNORMAL LOW (ref 300–900)

## 2019-07-01 LAB — LIPASE, BLOOD: Lipase: 25 U/L (ref 11–51)

## 2019-07-01 LAB — SARS CORONAVIRUS 2 (TAT 6-24 HRS): SARS Coronavirus 2: NEGATIVE

## 2019-07-01 LAB — MRSA PCR SCREENING: MRSA by PCR: NEGATIVE

## 2019-07-01 MED ORDER — IOHEXOL 300 MG/ML  SOLN
100.0000 mL | Freq: Once | INTRAMUSCULAR | Status: AC | PRN
  Administered 2019-07-01: 100 mL via INTRAVENOUS

## 2019-07-01 MED ORDER — POLYETHYLENE GLYCOL 3350 17 G PO PACK
17.0000 g | PACK | Freq: Two times a day (BID) | ORAL | Status: DC
  Administered 2019-07-01 – 2019-07-06 (×8): 17 g via ORAL
  Filled 2019-07-01 (×9): qty 1

## 2019-07-01 MED ORDER — IOHEXOL 9 MG/ML PO SOLN
500.0000 mL | ORAL | Status: AC
  Administered 2019-07-01 (×2): 500 mL via ORAL

## 2019-07-01 NOTE — TOC Initial Note (Addendum)
Transition of Care [facility]) - Initial/Assessment Note    Patient Details  Name: Natalie Thomas MRN: [id number] Date of Birth: 1960-10-10  Transition of Care [facility]) CM/SW Contact:    Ross Ludwig, LCSW Phone Number: 07/01/2019, 4:47 PM  Clinical Narrative:                 Patient is a 59 year old female who is alert and oriented x4.  Patient was sleeping, CSW completed assessment by speaking to [facility] the Therapist, sports at [facility].  Patient has a legal guardian through [place], [place] spoke to legal guardian Riccardo Dubin the plan is for patient to return back to ALF once she is medically ready for discharge.  Per DSS worker and RN at ALF, patient has had some increased delusions, hallucinations, and paranoia.  She has had some psych med changes they are requesting that psych see patient while in the hospital.  CSW sent message to attending physician.  Per RN and legal guardian, patient is normally standby assistance and does not need much help from staff.  Patient does have history of becoming verbally aggressive towards staff at [facility].  Patient does have an extensive psych history as well, and is followed by psych regularly.  CSW to continue to follow patient's progress throughout discharge planning.   Expected Discharge Plan: Assisted Living Barriers to Discharge: Continued Medical Work up   Patient Goals and CMS Choice Patient states their goals for this hospitalization and ongoing recovery are:: To return back to ALF CMS Medicare.gov Compare Post Acute Care list provided to:: Patient Represenative (must comment)    Expected Discharge Plan and Services Expected Discharge Plan: Assisted Living In-house Referral: Clinical Social Work                                 HH Arranged: NA          Prior Living Arrangements/Services   Lives with:: Facility Resident Patient language and need for interpreter reviewed:: Yes Do you feel safe going back to the place  where you live?: Yes      Need for Family Participation in Patient Care: Yes (Comment) Care giver support system in place?: Yes (comment)      Activities of Daily Living Home Assistive Devices/Equipment: Wheelchair, Environmental consultant (specify type) ADL Screening (condition at time of admission) Patient's cognitive ability adequate to safely complete daily activities?: Yes Is the patient deaf or have difficulty hearing?: No Does the patient have difficulty seeing, even when wearing glasses/contacts?: No Does the patient have difficulty concentrating, remembering, or making decisions?: No Patient able to express need for assistance with ADLs?: Yes Does the patient have difficulty dressing or bathing?: Yes Independently performs ADLs?: Yes (appropriate for developmental age)(lives at [facility]) Does the patient have difficulty walking or climbing stairs?: Yes Weakness of Legs: Both Weakness of Arms/Hands: None  Permission Sought/Granted Permission sought to share information with : Facility Sport and exercise psychologist, Guardian Permission granted to share information with : Yes, Release of Information Signed  Share Information with NAME: Silvano Rusk Legal Guardian [phone]  Permission granted to share info w AGENCY: [facility]        Emotional Assessment Appearance:: Appears stated age   Affect (typically observed): Accepting Orientation: : Oriented to Self, Oriented to Place, Oriented to  Time, Oriented to Situation Alcohol / Substance Use: Not Applicable Psych Involvement: Yes (comment)  Admission  diagnosis:  Hyponatremia [E87.1] Nausea [R11.0] Weakness [R53.1] Abdominal pain [R10.9] Patient Active Problem List   Diagnosis Date Noted  . Hypertensive crisis 05/01/2019  . Abdominal pain 03/15/2019  . Pleural effusion 03/13/2019  . Atelectasis 03/13/2019  . Chronic diastolic CHF (congestive heart failure) ([place]) 03/13/2019  . Type 2  diabetes mellitus without complication ([place]) [id number]  . Urinary tract bacterial infections 03/09/2019  . Acute cystitis without hematuria   . Constipation   . Acute hypoxemic respiratory failure ([place]) 12/03/2018  . Irritability 11/09/2018  . Aggressive behavior 11/09/2018  . Agitation   . S/P right hip fracture 11/07/2017  . Closed comminuted intertrochanteric fracture of right femur ([place]) 11/07/2017  . Anxiety and depression   . Dyspnea 10/16/2017  . Anemia 10/16/2017  . COPD exacerbation ([place]) 10/07/2017  . Hyperlipidemia 10/07/2017  . Acute on chronic respiratory failure with hypoxia and hypercapnia ([place]) 10/07/2017  . Incontinence of urine 08/14/2011  . Schizoaffective disorder, bipolar type ([place]) 07/05/2011  . Cough 05/08/2011  . Lumbar disc disease 04/09/2011  . Polycythemia secondary to smoking 04/09/2011  . HIP PAIN, LEFT 12/12/2007  . 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 DM (diabetes mellitus), type 2 ([place]) 04/08/2007  . MENOPAUSE, PREMATURE 04/08/2007  . Chronic hyponatremia 04/08/2007  . Essential hypertension 04/08/2007  . COPD (chronic obstructive pulmonary disease) ([place]) 04/08/2007  . GERD 04/08/2007  . Osteoporosis 04/08/2007   PCP:  System, Pcp Not In Pharmacy:   [place], [place] [address] Phone: [phone] Fax: [phone]     Social Determinants of Health (SDOH) Interventions    Readmission Risk Interventions Readmission Risk Prevention Plan 03/10/2019 12/08/2018  Transportation Screening Complete Complete  Medication Review Press photographer) Complete Complete  PCP or Specialist appointment within 3-5 days of discharge Complete Complete  HRI or Home Care Consult Complete Complete  SW Recovery Care/Counseling Consult Complete Complete  Palliative Care Screening Not Applicable Not Applicable  Skilled Nursing Facility Complete Complete  Some recent data might be hidden

## 2019-07-01 NOTE — Progress Notes (Signed)
RN called the patient's legal guardian, [name], and updated her on the patient's status. RN asked the patient's legal guardian for a number for the patient's family d/t the patient asking for her family to be called. [name] gave RN the number for the patient's Aunt Rochel Brome [phone].

## 2019-07-01 NOTE — Progress Notes (Signed)
Writer received call from [employer] at [facility], pt  was hallucinating for the past 1 week. She also mentioned there was a change in her mental status. Pt may need a psych consult. MD is paged.

## 2019-07-01 NOTE — Progress Notes (Signed)
PROGRESS NOTE  Natalie Thomas [id number] DOB: March 18, 1961 DOA: 06/30/2019 PCP: System, Pcp Not In  HPI/Recap of past 24 hours: HPI from Dr Candise Bowens Eckert is a 59 y.o. female with medical history significant of bipolar/schizoaffective disorder, HTN, HLD, DM, COPD on 2L, chronic hyponatremia and diastolic CHF who presents with concerns of nausea, abdominal pain for the past few days. Pt notes diffuse abdominal pain but appears worse on epigastric area. Also thinks it could be due to her umbilical hernia. She reports gagging every morning and then her abdomen would hurt all day. She is unable to describe the characteristic of the pain. Not been able to eat much. Not sure if food makes it worse. Had not had a "good" bowel movement. In the ED, afebrile, hypertensive up tp 198/66 on home 2L via [place]. Sodium of 121, BNP of 273. Pt received Tylenol, pepcid and Maalox in the ED. TRH asked to admit    Today, patient continues to complain of abdominal pain, generalize, unable to localize, denies any nausea/vomiting, no BM yet.  Denies any chest pain, cough, shortness of breath.  Assessment/Plan: Principal Problem:   Abdominal pain Active Problems:   Chronic hyponatremia   Essential hypertension   COPD (chronic obstructive pulmonary disease) (HCC)   Schizoaffective disorder, bipolar type (HCC)   Constipation   Chronic diastolic CHF (congestive heart failure) (HCC)  Abdominal pain/nausea Unknown etiology, hx of chronic constipation/GERD Recently had negative CT abdomen in Oct 2020 Abdominal X-ray shows moderate stool burden Suspect this could be from her chronic issues and polypharmacy-she is on a significant amount of medication that could cause GI side effects Received Maloxx and pepcid in the ED PRN low dose morphine for pain PRN Zofran for nausea If abdominal pain worsens, will order repeat CT abdomen  Constipation Continue miralax, add senna  Acute on chronic Hyponatremia    121 on admit. Appears 130 at baseline, improving Per previous admission, appears to be concerns of polydipsia. Psych medication also could be contributory Urine sodium 149, osmolality 143, plasma osmolality 260 Status post IV fluids in the ED, start fluid restriction Continue salt tabs  Schizoaffective disorder/bipolar disorder/anxiety Continue Celexa, vistaril, latuda, Zyprexa Pt had numerous medication changes during her last admission in Nov 2020  COPD Reports chronically on 2L  Continue duoneb, Breo Ellipta, Albuterol  Chronic diastolic CHF Appears to be ?compensated BNP 273.7 Continue metoprolol, Lasix, lisinopril, procardia  Hypertension Stable Continue home meds  Tobacco use Advised to quit Continue daily nicotine patch  Obesity Body mass index is 31.24 kg/m Lifestyle modification advised          Malnutrition Type:      Malnutrition Characteristics:      Nutrition Interventions:       Estimated body mass index is 31.92 kg/m as calculated from the following:   Height as of this encounter: 5\' 5"  (1.651 m).   Weight as of this encounter: 87 kg.     Code Status: Full  Family Communication: None at bedside  Disposition Plan: To be determined   Consultants:  None  Procedures:  None  Antimicrobials:  None  DVT prophylaxis: Lovenox   Objective: Vitals:   07/01/19 0750 07/01/19 0936 07/01/19 1302 07/01/19 1411  BP:  133/87  132/67  Pulse:  (!) 53  (!) 58  Resp:  20  17  Temp:  98.1 F (36.7 C)  97.7 F (36.5 C)  TempSrc:  Oral    SpO2: 95% 97% 95% 95%  Weight:      Height:        Intake/Output Summary (Last 24 hours) at 07/01/2019 1522 Last data filed at 07/01/2019 1149 Gross per 24 hour  Intake 1277 ml  Output 1050 ml  Net 227 ml   Filed Weights   07/01/19 0007  Weight: 87 kg    Exam:  General: NAD   Cardiovascular: S1, S2 present  Respiratory:  Diminished breath sounds bilaterally  Abdomen:  Soft, nontender, reducible umbilical hernia, nondistended, bowel sounds present  Musculoskeletal: No bilateral pedal edema noted  Skin: Normal  Psychiatry:  Agitated mood   Data Reviewed: CBC: Recent Labs  Lab 06/30/19 1720 07/01/19 0433  WBC 5.1 4.4  NEUTROABS 3.1  --   HGB 13.3 12.1  HCT 39.6 37.2  MCV 90.2 92.1  PLT 175 0000000   Basic Metabolic Panel: Recent Labs  Lab 06/30/19 1720 07/01/19 0433  NA 121* 126*  K 4.8 4.6  CL 83* 89*  CO2 29 28  GLUCOSE 80 79  BUN 13 12  CREATININE 0.55 0.52  CALCIUM 9.0 8.5*   GFR: Estimated Creatinine Clearance: 84.5 mL/min (by C-G formula based on SCr of 0.52 mg/dL). Liver Function Tests: Recent Labs  Lab 06/30/19 1720  AST 13*  ALT 11  ALKPHOS 96  BILITOT 0.6  PROT 7.3  ALBUMIN 3.7   Recent Labs  Lab 07/01/19 0433  LIPASE 25   No results for input(s): AMMONIA in the last 168 hours. Coagulation Profile: No results for input(s): INR, PROTIME in the last 168 hours. Cardiac Enzymes: No results for input(s): CKTOTAL, CKMB, CKMBINDEX, TROPONINI in the last 168 hours. BNP (last 3 results) No results for input(s): PROBNP in the last 8760 hours. HbA1C: No results for input(s): HGBA1C in the last 72 hours. CBG: No results for input(s): GLUCAP in the last 168 hours. Lipid Profile: No results for input(s): CHOL, HDL, LDLCALC, TRIG, CHOLHDL, LDLDIRECT in the last 72 hours. Thyroid Function Tests: No results for input(s): TSH, T4TOTAL, FREET4, T3FREE, THYROIDAB in the last 72 hours. Anemia Panel: No results for input(s): VITAMINB12, FOLATE, FERRITIN, TIBC, IRON, RETICCTPCT in the last 72 hours. Urine analysis:    Component Value Date/Time   COLORURINE YELLOW 05/02/2019 1828   APPEARANCEUR CLEAR 05/02/2019 1828   APPEARANCEUR Clear 06/22/2012 0618   LABSPEC 1.033 (H) 05/02/2019 1828   LABSPEC 1.003 06/22/2012 0618   PHURINE 6.0 05/02/2019 1828   GLUCOSEU NEGATIVE 05/02/2019 1828   GLUCOSEU Negative 06/22/2012 0618    GLUCOSEU NEGATIVE 04/09/2011 1058   HGBUR NEGATIVE 05/02/2019 1828   BILIRUBINUR NEGATIVE 05/02/2019 1828   BILIRUBINUR Negative 06/22/2012 0618   KETONESUR 20 (A) 05/02/2019 1828   PROTEINUR NEGATIVE 05/02/2019 1828   UROBILINOGEN 1.0 08/18/2011 1805   NITRITE NEGATIVE 05/02/2019 1828   LEUKOCYTESUR NEGATIVE 05/02/2019 1828   LEUKOCYTESUR Trace 06/22/2012 0618   Sepsis Labs: @LABRCNTIP (procalcitonin:4,lacticidven:4)  ) Recent Results (from the past 240 hour(s))  SARS CORONAVIRUS 2 (TAT 6-24 HRS) Nasopharyngeal Nasopharyngeal Swab     Status: None   Collection Time: 06/30/19  8:40 PM   Specimen: Nasopharyngeal Swab  Result Value Ref Range Status   SARS Coronavirus 2 NEGATIVE NEGATIVE Final    Comment: (NOTE) SARS-CoV-2 target nucleic acids are NOT DETECTED. The SARS-CoV-2 RNA is generally detectable in upper and lower respiratory specimens during the acute phase of infection. Negative results do not preclude SARS-CoV-2 infection, do not rule out co-infections with other pathogens, and should not be used as the sole basis for  treatment or other patient management decisions. Negative results must be combined with clinical observations, patient history, and epidemiological information. The expected result is Negative. Fact Sheet for Patients: SugarRoll.be Fact Sheet for Healthcare Providers: https://www.woods-mathews.com/ This test is not yet approved or cleared by the [place] FDA and  has been authorized for detection and/or diagnosis of SARS-CoV-2 by FDA under an Emergency Use Authorization (EUA). This EUA will remain  in effect (meaning this test can be used) for the duration of the COVID-19 declaration under Section 56 4(b)(1) of the Act, 21 U.S.C. section 360bbb-3(b)(1), unless the authorization is terminated or revoked sooner. Performed at [facility], [place] [address], [place], [place] [address]   MRSA PCR  Screening     Status: None   Collection Time: 07/01/19  2:27 AM   Specimen: Nasal Mucosa; Nasopharyngeal  Result Value Ref Range Status   MRSA by PCR NEGATIVE NEGATIVE Final    Comment:        The GeneXpert MRSA Assay (FDA approved for NASAL specimens only), is one component of a comprehensive MRSA colonization surveillance program. It is not intended to diagnose MRSA infection nor to guide or monitor treatment for MRSA infections. Performed at [facility], [place] [address] [place], [place] [address]       Studies: DG Abdomen Acute W/Chest  Result Date: 06/30/2019 CLINICAL DATA:  Abdominal pain EXAM: DG ABDOMEN ACUTE W/ 1V CHEST COMPARISON:  None. FINDINGS: There is no evidence of dilated bowel loops or free intraperitoneal air. Moderate stool burden. Cholecystectomy clips. Mild chronic interstitial prominence.  Stable cardiomegaly. Bilateral sacral plasty.  Partially imaged right femur fixation. IMPRESSION: No acute process. Electronically Signed   By: Macy Mis M.D.   On: 06/30/2019 17:19    Scheduled Meds: . aspirin  81 mg Oral Daily  . citalopram  20 mg Oral QHS  . clonazePAM  1 mg Oral BID  . enoxaparin (LOVENOX) injection  40 mg Subcutaneous Q24H  . fluticasone furoate-vilanterol  1 puff Inhalation Daily  . furosemide  20 mg Oral Daily  . gabapentin  100 mg Oral BID  . hydrOXYzine  50 mg Oral TID  . ipratropium-albuterol  3 mL Nebulization TID  . lisinopril  40 mg Oral Daily  . lurasidone  20 mg Oral BID  . Melatonin  5 mg Oral QHS  . metoprolol tartrate  25 mg Oral BID  . nicotine  7 mg Transdermal Daily  . NIFEdipine  90 mg Oral Daily  . OLANZapine  5 mg Oral BID  . pantoprazole  40 mg Oral Daily  . polyethylene glycol  17 g Oral BID  . senna-docusate  1 tablet Oral BID  . sodium chloride  1 g Oral QID  . valproic acid  500 mg Oral TID    Continuous Infusions:   LOS: 0 days     Alma Friendly, MD [facility]  If  7PM-7AM, please contact night-coverage [email or web address] 07/01/2019, 3:22 PM

## 2019-07-01 NOTE — Plan of Care (Signed)
  Problem: Education: Goal: Knowledge of General Education information will improve Description: Including pain rating scale, medication(s)/side effects and non-pharmacologic comfort measures Outcome: Progressing   Problem: Clinical Measurements: Goal: Respiratory complications will improve Outcome: Progressing   Problem: Activity: Goal: Risk for activity intolerance will decrease Outcome: Progressing   Problem: Coping: Goal: Level of anxiety will decrease Outcome: Progressing   Problem: Elimination: Goal: Will not experience complications related to urinary retention Outcome: Progressing   Problem: Pain Managment: Goal: General experience of comfort will improve Outcome: Progressing   Problem: Safety: Goal: Ability to remain free from injury will improve Outcome: Progressing   Problem: Skin Integrity: Goal: Risk for impaired skin integrity will decrease Outcome: Progressing

## 2019-07-01 NOTE — Progress Notes (Signed)
Patient is having increased agitation, confusion and paranoia. Rn gave the patient PRN Olanzapine per order. RN notified Baltazar Najjar, NP. No orders received. RN Will continue to monitor.

## 2019-07-02 LAB — URINALYSIS, ROUTINE W REFLEX MICROSCOPIC
Bilirubin Urine: NEGATIVE
Glucose, UA: NEGATIVE mg/dL
Hgb urine dipstick: NEGATIVE
Ketones, ur: NEGATIVE mg/dL
Leukocytes,Ua: NEGATIVE
Nitrite: NEGATIVE
Protein, ur: NEGATIVE mg/dL
Specific Gravity, Urine: 1.012 (ref 1.005–1.030)
pH: 6 (ref 5.0–8.0)

## 2019-07-02 LAB — COMPREHENSIVE METABOLIC PANEL
ALT: 10 U/L (ref 0–44)
AST: 11 U/L — ABNORMAL LOW (ref 15–41)
Albumin: 2.9 g/dL — ABNORMAL LOW (ref 3.5–5.0)
Alkaline Phosphatase: 76 U/L (ref 38–126)
Anion gap: 9 (ref 5–15)
BUN: 13 mg/dL (ref 6–20)
CO2: 27 mmol/L (ref 22–32)
Calcium: 8.2 mg/dL — ABNORMAL LOW (ref 8.9–10.3)
Chloride: 87 mmol/L — ABNORMAL LOW (ref 98–111)
Creatinine, Ser: 0.69 mg/dL (ref 0.44–1.00)
GFR calc Af Amer: >60 mL/min (ref >60)
GFR calc non Af Amer: >60 mL/min (ref >60)
Glucose, Bld: 114 mg/dL — ABNORMAL HIGH (ref 70–99)
Potassium: 4.7 mmol/L (ref 3.5–5.1)
Sodium: 123 mmol/L — ABNORMAL LOW (ref 135–145)
Total Bilirubin: 0.3 mg/dL (ref 0.3–1.2)
Total Protein: 5.9 g/dL — ABNORMAL LOW (ref 6.5–8.1)

## 2019-07-02 LAB — CBC WITH DIFFERENTIAL/PLATELET
Abs Immature Granulocytes: 0.02 10*3/uL (ref 0.00–0.07)
Basophils Absolute: 0 10*3/uL (ref 0.0–0.1)
Basophils Relative: 1 %
Eosinophils Absolute: 0.1 10*3/uL (ref 0.0–0.5)
Eosinophils Relative: 1 %
HCT: 36 % (ref 36.0–46.0)
Hemoglobin: 11.6 g/dL — ABNORMAL LOW (ref 12.0–15.0)
Immature Granulocytes: 0 %
Lymphocytes Relative: 22 %
Lymphs Abs: 1.2 10*3/uL (ref 0.7–4.0)
MCH: 30 pg (ref 26.0–34.0)
MCHC: 32.2 g/dL (ref 30.0–36.0)
MCV: 93 fL (ref 80.0–100.0)
Monocytes Absolute: 0.6 10*3/uL (ref 0.1–1.0)
Monocytes Relative: 11 %
Neutro Abs: 3.7 10*3/uL (ref 1.7–7.7)
Neutrophils Relative %: 65 %
Platelets: 168 10*3/uL (ref 150–400)
RBC: 3.87 MIL/uL (ref 3.87–5.11)
RDW: 14.6 % (ref 11.5–15.5)
WBC: 5.7 10*3/uL (ref 4.0–10.5)
nRBC: 0 % (ref 0.0–0.2)

## 2019-07-02 LAB — GLUCOSE, CAPILLARY: Glucose-Capillary: 146 mg/dL — ABNORMAL HIGH (ref 70–99)

## 2019-07-02 NOTE — Progress Notes (Signed)
Patient is noted to be having increased confusion, agitation,and paranoia. RN accompanied the patient to CT in order to keep reassuring her that she was safe and to continue reorienting her. RN will continue to monitor the patient.

## 2019-07-02 NOTE — Progress Notes (Signed)
PROGRESS NOTE  Natalie Thomas [id number] DOB: 20-Jun-1961 DOA: 06/30/2019 PCP: System, Pcp Not In  HPI/Recap of past 24 hours: HPI from Dr Candise Bowens Natalie Thomas is a 59 y.o. female with medical history significant of bipolar/schizoaffective disorder, HTN, HLD, DM, COPD on 2L, chronic hyponatremia and diastolic CHF who presents with concerns of nausea, abdominal pain for the past few days. Pt notes diffuse abdominal pain but appears worse on epigastric area. Also thinks it could be due to her umbilical hernia. She reports gagging every morning and then her abdomen would hurt all day. She is unable to describe the characteristic of the pain. Not been able to eat much. Not sure if food makes it worse. Had not had a "good" bowel movement. In the ED, afebrile, hypertensive up tp 198/66 on home 2L via [place]. Sodium of 121, BNP of 273. Pt received Tylenol, pepcid and Maalox in the ED. TRH asked to admit    Today, patient with continuous paranoia, with auditory/visual hallucinations, agitation, periodically yelling at staff, continues to complain of chronic abdominal pain, fixated on the fact that she has hernia.  Psych consulted, no further recommendation     Assessment/Plan: Principal Problem:   Abdominal pain Active Problems:   Chronic hyponatremia   Essential hypertension   COPD (chronic obstructive pulmonary disease) (HCC)   Schizoaffective disorder, bipolar type (HCC)   Constipation   Chronic diastolic CHF (congestive heart failure) (HCC)  Abdominal pain/nausea Unknown etiology, hx of chronic constipation/GERD Recently had negative CT abdomen in Oct 2020 Abdominal X-ray shows moderate stool burden CT abdomen/pelvis unremarkable except for mild thickening along the anterior right wall of the bladder, recommend for signs of cystitis UA negative, UC pending Suspect this could be from her chronic issues and polypharmacy, ??  Psych related (longstanding delusion) Received Maloxx and  pepcid in the ED PRN Zofran for nausea  Constipation Continue miralax, add senna, having loose stool, likely overflow  Acute on chronic Hyponatremia  121 on admit. Appears 130 at baseline, fluctuating Per previous admission, appears to be concerns of polydipsia. Psych medication also could be contributory Urine sodium 149, osmolality 143, plasma osmolality 260 Status post IV fluids in the ED, start fluid restriction Continue salt tabs  Schizoaffective disorder/bipolar disorder/anxiety Continue Celexa, vistaril, latuda, Zyprexa Pt had numerous medication changes during her last admission in Nov 2020 Psychiatry consulted, no further recs, outpatient follow-up  COPD Reports chronically on 2L  Continue duoneb, Breo Ellipta, Albuterol  Chronic diastolic CHF Appears to be ?compensated BNP 273.7 Continue metoprolol, Lasix, lisinopril, procardia  Hypertension Stable Continue home meds  Tobacco use Advised to quit Continue daily nicotine patch  Obesity Lifestyle modification advised       Malnutrition Type:      Malnutrition Characteristics:      Nutrition Interventions:       Estimated body mass index is 31.92 kg/m as calculated from the following:   Height as of this encounter: 5\' 5"  (1.651 m).   Weight as of this encounter: 87 kg.     Code Status: Full  Family Communication: None at bedside  Disposition Plan: To be determined   Consultants:  Psychiatry  Procedures:  None  Antimicrobials:  None  DVT prophylaxis: Lovenox   Objective: Vitals:   07/02/19 0853 07/02/19 0922 07/02/19 1348 07/02/19 1404  BP:  115/68 (!) 178/55   Pulse:  62 60   Resp:  18    Temp:  98.4 F (36.9 C)    TempSrc:  Oral    SpO2: 96% 97%  95%  Weight:      Height:        Intake/Output Summary (Last 24 hours) at 07/02/2019 1626 Last data filed at 07/02/2019 1528 Gross per 24 hour  Intake 2135 ml  Output 3200 ml  Net -1065 ml   Filed Weights    07/01/19 0007  Weight: 87 kg    Exam:  General:  Agitated, periodically yelling, paranoia with hallucinations  Cardiovascular: S1, S2 present  Respiratory:  Diminished breath sounds bilaterally  Abdomen: Soft, nontender, nondistended, reducible umbilical hernia, bowel sounds present  Musculoskeletal: No bilateral pedal edema noted  Skin: Normal  Psychiatry: Agitated mood   Data Reviewed: CBC: Recent Labs  Lab 06/30/19 1720 07/01/19 0433 07/02/19 0414  WBC 5.1 4.4 5.7  NEUTROABS 3.1  --  3.7  HGB 13.3 12.1 11.6*  HCT 39.6 37.2 36.0  MCV 90.2 92.1 93.0  PLT 175 175 [id number]   Basic Metabolic Panel: Recent Labs  Lab 06/30/19 1720 07/01/19 0433 07/02/19 0414  NA 121* 126* 123*  K 4.8 4.6 4.7  CL 83* 89* 87*  CO2 29 28 27   GLUCOSE 80 79 114*  BUN 13 12 13   CREATININE 0.55 0.52 0.69  CALCIUM 9.0 8.5* 8.2*   GFR: Estimated Creatinine Clearance: 84.5 mL/min (by C-G formula based on SCr of 0.69 mg/dL). Liver Function Tests: Recent Labs  Lab 06/30/19 1720 07/02/19 0414  AST 13* 11*  ALT 11 10  ALKPHOS 96 76  BILITOT 0.6 0.3  PROT 7.3 5.9*  ALBUMIN 3.7 2.9*   Recent Labs  Lab 07/01/19 0433  LIPASE 25   No results for input(s): AMMONIA in the last 168 hours. Coagulation Profile: No results for input(s): INR, PROTIME in the last 168 hours. Cardiac Enzymes: No results for input(s): CKTOTAL, CKMB, CKMBINDEX, TROPONINI in the last 168 hours. BNP (last 3 results) No results for input(s): PROBNP in the last 8760 hours. HbA1C: No results for input(s): HGBA1C in the last 72 hours. CBG: Recent Labs  Lab 07/02/19 0504  GLUCAP 146*   Lipid Profile: No results for input(s): CHOL, HDL, LDLCALC, TRIG, CHOLHDL, LDLDIRECT in the last 72 hours. Thyroid Function Tests: No results for input(s): TSH, T4TOTAL, FREET4, T3FREE, THYROIDAB in the last 72 hours. Anemia Panel: No results for input(s): VITAMINB12, FOLATE, FERRITIN, TIBC, IRON, RETICCTPCT in the last 72  hours. Urine analysis:    Component Value Date/Time   COLORURINE YELLOW 07/02/2019 1048   APPEARANCEUR CLEAR 07/02/2019 1048   APPEARANCEUR Clear 06/22/2012 0618   LABSPEC 1.012 07/02/2019 1048   LABSPEC 1.003 06/22/2012 0618   PHURINE 6.0 07/02/2019 1048   GLUCOSEU NEGATIVE 07/02/2019 1048   GLUCOSEU Negative 06/22/2012 0618   GLUCOSEU NEGATIVE 04/09/2011 1058   HGBUR NEGATIVE 07/02/2019 1048   BILIRUBINUR NEGATIVE 07/02/2019 1048   BILIRUBINUR Negative 06/22/2012 0618   KETONESUR NEGATIVE 07/02/2019 1048   PROTEINUR NEGATIVE 07/02/2019 1048   UROBILINOGEN 1.0 08/18/2011 1805   NITRITE NEGATIVE 07/02/2019 1048   LEUKOCYTESUR NEGATIVE 07/02/2019 1048   LEUKOCYTESUR Trace 06/22/2012 0618   Sepsis Labs: @LABRCNTIP (procalcitonin:4,lacticidven:4)  ) Recent Results (from the past 240 hour(s))  SARS CORONAVIRUS 2 (TAT 6-24 HRS) Nasopharyngeal Nasopharyngeal Swab     Status: None   Collection Time: 06/30/19  8:40 PM   Specimen: Nasopharyngeal Swab  Result Value Ref Range Status   SARS Coronavirus 2 NEGATIVE NEGATIVE Final    Comment: (NOTE) SARS-CoV-2 target nucleic acids are NOT DETECTED. The SARS-CoV-2 RNA is  generally detectable in upper and lower respiratory specimens during the acute phase of infection. Negative results do not preclude SARS-CoV-2 infection, do not rule out co-infections with other pathogens, and should not be used as the sole basis for treatment or other patient management decisions. Negative results must be combined with clinical observations, patient history, and epidemiological information. The expected result is Negative. Fact Sheet for Patients: SugarRoll.be Fact Sheet for Healthcare Providers: https://www.woods-mathews.com/ This test is not yet approved or cleared by the [place] FDA and  has been authorized for detection and/or diagnosis of SARS-CoV-2 by FDA under an Emergency Use Authorization (EUA).  This EUA will remain  in effect (meaning this test can be used) for the duration of the COVID-19 declaration under Section 56 4(b)(1) of the Act, 21 U.S.C. section 360bbb-3(b)(1), unless the authorization is terminated or revoked sooner. Performed at [facility], [place] [address], [place], [place] [address]   MRSA PCR Screening     Status: None   Collection Time: 07/01/19  2:27 AM   Specimen: Nasal Mucosa; Nasopharyngeal  Result Value Ref Range Status   MRSA by PCR NEGATIVE NEGATIVE Final    Comment:        The GeneXpert MRSA Assay (FDA approved for NASAL specimens only), is one component of a comprehensive MRSA colonization surveillance program. It is not intended to diagnose MRSA infection nor to guide or monitor treatment for MRSA infections. Performed at [facility], [place] [address], [place], [place] [address]       Studies: CT ABDOMEN PELVIS W CONTRAST  Result Date: 07/01/2019 CLINICAL DATA:  Abdominal pain and distension EXAM: CT ABDOMEN AND PELVIS WITH CONTRAST TECHNIQUE: Multidetector CT imaging of the abdomen and pelvis was performed using the standard protocol following bolus administration of intravenous contrast. CONTRAST:  167mL OMNIPAQUE IOHEXOL 300 MG/ML  SOLN COMPARISON:  CT 04/21/2019 FINDINGS: Lower chest: Bilateral small pleural effusions Hepatobiliary: No focal hepatic lesion. Postcholecystectomy. No biliary dilatation. Pancreas: Pancreas is normal. No ductal dilatation. No pancreatic inflammation. Spleen: Normal spleen Adrenals/urinary tract: Nonobstructing calculus in the mid LEFT kidney. Cortical calcification in the RIGHT kidney. Ureters are normal. There is thickening along the anterior RIGHT wall the bladder which is mild measuring 6 mm on image 65/2. Stomach/Bowel: Stomach, small-bowel and cecum are normal. The appendix is not identified but there is no pericecal inflammation to suggest appendicitis. The colon and rectosigmoid colon are  normal. Vascular/Lymphatic: Abdominal aorta is normal caliber with atherosclerotic calcification. There is no retroperitoneal or periportal lymphadenopathy. No pelvic lymphadenopathy. Reproductive: Uterus and necks are normal. Other: No free fluid. Musculoskeletal: Degenerative changes of the spine. Chronic compression fracture T12. IMPRESSION: 1. Small bilateral pleural effusions. 2. Mild thickening along the anterior RIGHT wall of the bladder. Recommend correlation with hematuria or signs of cystitis. 3. Aortic Atherosclerosis (ICD10-I70.0). Electronically Signed   By: Suzy Bouchard M.D.   On: 07/01/2019 20:41    Scheduled Meds: . aspirin  81 mg Oral Daily  . citalopram  20 mg Oral QHS  . clonazePAM  1 mg Oral BID  . enoxaparin (LOVENOX) injection  40 mg Subcutaneous Q24H  . fluticasone furoate-vilanterol  1 puff Inhalation Daily  . furosemide  20 mg Oral Daily  . gabapentin  100 mg Oral BID  . hydrOXYzine  50 mg Oral TID  . ipratropium-albuterol  3 mL Nebulization TID  . lisinopril  40 mg Oral Daily  . lurasidone  20 mg Oral BID  . Melatonin  5  mg Oral QHS  . metoprolol tartrate  25 mg Oral BID  . nicotine  7 mg Transdermal Daily  . NIFEdipine  90 mg Oral Daily  . OLANZapine  5 mg Oral BID  . pantoprazole  40 mg Oral Daily  . polyethylene glycol  17 g Oral BID  . senna-docusate  1 tablet Oral BID  . sodium chloride  1 g Oral QID  . valproic acid  500 mg Oral TID    Continuous Infusions:   LOS: 1 day     Alma Friendly, MD [facility]  If 7PM-7AM, please contact night-coverage [email or web address] 07/02/2019, 4:26 PM

## 2019-07-02 NOTE — Plan of Care (Signed)
  Problem: Education: Goal: Knowledge of General Education information will improve Description: Including pain rating scale, medication(s)/side effects and non-pharmacologic comfort measures Outcome: Progressing   Problem: Clinical Measurements: Goal: Will remain free from infection Outcome: Progressing Goal: Respiratory complications will improve Outcome: Progressing   Problem: Elimination: Goal: Will not experience complications related to urinary retention Outcome: Progressing   Problem: Safety: Goal: Ability to remain free from injury will improve Outcome: Progressing

## 2019-07-02 NOTE — Progress Notes (Signed)
RN notified Natalie Najjar, NP that the pt c/o Lightheadedness, BP 115/66, HR 54, CBG 146. No orders received. RN will continue to monitor.

## 2019-07-02 NOTE — Consult Note (Signed)
Telepsych Consultation   Reason for Consult:  "Pt with hx of bipolar, noted to be hallucinating 1 week ago, with increased paranoia and delusions." Referring Physician:  Dr Horris Latino Location of Patient: [facility] [id number] Location of Provider: [facility]  Patient Identification: Natalie Thomas MRN:  [id number] Principal Diagnosis: Abdominal pain Diagnosis:  Principal Problem:   Abdominal pain Active Problems:   Chronic hyponatremia   Essential hypertension   COPD (chronic obstructive pulmonary disease) (HCC)   Schizoaffective disorder, bipolar type ([place])   Constipation   Chronic diastolic CHF (congestive heart failure) ([place])   Total Time spent with patient: 30 minutes  Subjective:   Natalie Thomas is a 59 y.o. female patient admitted with abdominal pain.  Patient assessed by nurse practitioner.  Patient alert and oriented, answers appropriately.  Patient reports she is from assisted living facility.  Patient states "I have manic depressive, nerves, bipolar and schizoaffective." Patient denies suicidal and homicidal ideations.  Denies history of self-harm.  Denies access to weapons.  Patient denies hallucinations.  Patient denies history of command hallucinations.  Patient reports "I have been taking medications for a lot of years."  Patient reports currently seen outpatient by psychiatry Dr. Bobby Rumpf and therapist Colletta Maryland.  Patient reports "I would like my Klonopin changed back to Xanax or Valium."  Patient states "I just do not like Latuda."  Patient states "take me off Paxil and put me on another depression medication." Patient reports fixed delusion x2 years "the TV mentions my first name I think I have paranoia."  Patient reports chronic abdominal pain.  Patient states "I just want to have a great day and think positive." Case discussed with Dr. Dwyane Dee.  HPI: Patient admitted with abdominal pain.  Past Psychiatric History: Major depressive disorder,  schizoaffective disorder  Risk to Self: Is patient at risk for suicide?: No Risk to Others:  Denies Prior Inpatient Therapy:  Denies Prior Outpatient Therapy:  Yes  Past Medical History:  Past Medical History:  Diagnosis Date  . Anemia 10/16/2017  . Anxiety and depression   . CHF (congestive heart failure) ([place])   . Chronic hyponatremia 04/08/2007   Qualifier: Diagnosis of  By: Marca Ancona RMA, Lucy    . Closed comminuted intertrochanteric fracture of right femur ([place]) 11/07/2017  . COPD 04/08/2007   Qualifier: Diagnosis of  By: Marca Ancona RMA, Lucy    . Depression   . Diabetes mellitus, type 2 ([place])    pt denies but states that she has been treated for DM  . Essential hypertension 04/08/2007   Qualifier: Diagnosis of  By: Marca Ancona RMA, Lucy    . GERD (gastroesophageal reflux disease)   . Hyperlipidemia 10/07/2017  . Lumbar disc disease 04/09/2011  . MENOPAUSE, PREMATURE 04/08/2007   Qualifier: Diagnosis of  By: Marca Ancona RMA, Lucy    . 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 Osteoporosis 04/08/2007   Qualifier: Diagnosis of  By: Reatha Armour, Lucy    . Polycythemia secondary to smoking 04/09/2011  . Schizoaffective disorder, bipolar type ([place]) 07/05/2011    Past Surgical History:  Procedure Laterality Date  . EYE SURGERY     on left  eye, pt states that she sees bad out of the right eye and needs surgery there  . INTRAMEDULLARY (IM) NAIL INTERTROCHANTERIC Right 11/13/2017   Procedure: INTRAMEDULLARY (IM) NAIL INTERTROCHANTRIC;  Surgeon: Shona Needles, MD;  Location: [place];  Service: Orthopedics;  Laterality: Right;  .  PELVIC FRACTURE SURGERY     Family History: History reviewed. No pertinent family history. Family Psychiatric  History: Denies Social History:  Social History   Substance and Sexual Activity  Alcohol Use No     Social History   Substance and Sexual Activity  Drug Use No    Social History   Socioeconomic History  .  Marital status: Divorced    Spouse name: Not on file  . Number of children: Not on file  . Years of education: Not on file  . Highest education level: Not on file  Occupational History  . Occupation: Disabled  Tobacco Use  . Smoking status: Current Every Day Smoker    Packs/day: 0.25    Types: Cigarettes  . Smokeless tobacco: Never Used  Substance and Sexual Activity  . Alcohol use: No  . Drug use: No  . Sexual activity: Never  Other Topics Concern  . Not on file  Social History Narrative   single   Social Determinants of Health   Financial Resource Strain:   . Difficulty of Paying Living Expenses: Not on file  Food Insecurity:   . Worried About Charity fundraiser in the Last Year: Not on file  . Ran Out of Food in the Last Year: Not on file  Transportation Needs:   . Lack of Transportation (Medical): Not on file  . Lack of Transportation (Non-Medical): Not on file  Physical Activity:   . Days of Exercise per Week: Not on file  . Minutes of Exercise per Session: Not on file  Stress:   . Feeling of Stress : Not on file  Social Connections:   . Frequency of Communication with Friends and Family: Not on file  . Frequency of Social Gatherings with Friends and Family: Not on file  . Attends Religious Services: Not on file  . Active Member of Clubs or Organizations: Not on file  . Attends Archivist Meetings: Not on file  . Marital Status: Not on file   Additional Social History:    Allergies:   Allergies  Allergen Reactions  . Clarithromycin Itching  . Penicillins Itching    Has tolerated cefazolin before  Has patient had a PCN reaction causing immediate rash, facial/tongue/throat swelling, SOB or lightheadedness with hypotension: No Has patient had a PCN reaction causing severe rash involving mucus membranes or skin necrosis: No Has patient had a PCN reaction that required hospitalization: Unknown Has patient had a PCN reaction occurring within the last  10 years: No If all of the above answers are "NO", then may proceed with Cephalosporin use.     Labs:  Results for orders placed or performed during the hospital encounter of 06/30/19 (from the past 48 hour(s))  CBC with Differential     Status: None   Collection Time: 06/30/19  5:20 PM  Result Value Ref Range   WBC 5.1 4.0 - 10.5 K/uL   RBC 4.39 3.87 - 5.11 MIL/uL   Hemoglobin 13.3 12.0 - 15.0 g/dL   HCT 39.6 36.0 - 46.0 %   MCV 90.2 80.0 - 100.0 fL   MCH 30.3 26.0 - 34.0 pg   MCHC 33.6 30.0 - 36.0 g/dL   RDW 14.3 11.5 - 15.5 %   Platelets 175 150 - 400 K/uL   nRBC 0.0 0.0 - 0.2 %   Neutrophils Relative % 61 %   Neutro Abs 3.1 1.7 - 7.7 K/uL   Lymphocytes Relative 28 %   Lymphs Abs 1.4  0.7 - 4.0 K/uL   Monocytes Relative 10 %   Monocytes Absolute 0.5 0.1 - 1.0 K/uL   Eosinophils Relative 1 %   Eosinophils Absolute 0.1 0.0 - 0.5 K/uL   Basophils Relative 0 %   Basophils Absolute 0.0 0.0 - 0.1 K/uL   Immature Granulocytes 0 %   Abs Immature Granulocytes 0.02 0.00 - 0.07 K/uL    Comment: Performed at [facility], [place] [address], [place], [place] [address]  Troponin I (High Sensitivity)     Status: None   Collection Time: 06/30/19  5:20 PM  Result Value Ref Range   Troponin I (High Sensitivity) 6 <18 ng/L    Comment: (NOTE) Elevated high sensitivity troponin I (hsTnI) values and significant  changes across serial measurements may suggest ACS but many other  chronic and acute conditions are known to elevate hsTnI results.  Refer to the "Links" section for chest pain algorithms and additional  guidance. Performed at [facility], [place] [address] [place], [place] [address]   Comprehensive metabolic panel     Status: Abnormal   Collection Time: 06/30/19  5:20 PM  Result Value Ref Range   Sodium 121 (L) 135 - 145 mmol/L   Potassium 4.8 3.5 - 5.1 mmol/L   Chloride 83 (L) 98 - 111 mmol/L   CO2 29 22 - 32 mmol/L   Glucose, Bld 80 70 -  99 mg/dL   BUN 13 6 - 20 mg/dL   Creatinine, Ser 0.55 0.44 - 1.00 mg/dL   Calcium 9.0 8.9 - 10.3 mg/dL   Total Protein 7.3 6.5 - 8.1 g/dL   Albumin 3.7 3.5 - 5.0 g/dL   AST 13 (L) 15 - 41 U/L   ALT 11 0 - 44 U/L   Alkaline Phosphatase 96 38 - 126 U/L   Total Bilirubin 0.6 0.3 - 1.2 mg/dL   GFR calc non Af Amer >60 >60 mL/min   GFR calc Af Amer >60 >60 mL/min   Anion gap 9 5 - 15    Comment: Performed at [facility], [place] [address], [place], [place] [address]  Brain natriuretic peptide     Status: Abnormal   Collection Time: 06/30/19  5:20 PM  Result Value Ref Range   B Natriuretic Peptide 273.7 (H) 0.0 - 100.0 pg/mL    Comment: Performed at [facility], [place] [address], [place], [place] [address]  Troponin I (High Sensitivity)     Status: None   Collection Time: 06/30/19  6:38 PM  Result Value Ref Range   Troponin I (High Sensitivity) 6 <18 ng/L    Comment: (NOTE) Elevated high sensitivity troponin I (hsTnI) values and significant  changes across serial measurements may suggest ACS but many other  chronic and acute conditions are known to elevate hsTnI results.  Refer to the "Links" section for chest pain algorithms and additional  guidance. Performed at [facility], [place] [address] [place], [place] [address]   SARS CORONAVIRUS 2 (TAT 6-24 HRS) Nasopharyngeal Nasopharyngeal Swab     Status: None   Collection Time: 06/30/19  8:40 PM   Specimen: Nasopharyngeal Swab  Result Value Ref Range   SARS Coronavirus 2 NEGATIVE NEGATIVE    Comment: (NOTE) SARS-CoV-2 target nucleic acids are NOT DETECTED. The SARS-CoV-2 RNA is generally detectable in upper and lower respiratory specimens during the acute phase of infection. Negative results do not preclude SARS-CoV-2 infection, do not rule out co-infections with other  pathogens, and should not be used as the sole basis for treatment or other patient management  decisions. Negative results must be combined with clinical observations, patient history, and epidemiological information. The expected result is Negative. Fact Sheet for Patients: SugarRoll.be Fact Sheet for Healthcare Providers: https://www.woods-mathews.com/ This test is not yet approved or cleared by the [place] FDA and  has been authorized for detection and/or diagnosis of SARS-CoV-2 by FDA under an Emergency Use Authorization (EUA). This EUA will remain  in effect (meaning this test can be used) for the duration of the COVID-19 declaration under Section 56 4(b)(1) of the Act, 21 U.S.C. section 360bbb-3(b)(1), unless the authorization is terminated or revoked sooner. Performed at [facility], [place] [address], [place], [place] [address]   Osmolality, urine     Status: Abnormal   Collection Time: 07/01/19  2:21 AM  Result Value Ref Range   Osmolality, Ur 143 (L) 300 - 900 mOsm/kg    Comment: Performed at [place] [address] [place], [place] [address]  Sodium, urine, random     Status: None   Collection Time: 07/01/19  2:22 AM  Result Value Ref Range   Sodium, Ur 49 mmol/L    Comment: Performed at [facility], [place] [address], [place], [place] [address]  MRSA PCR Screening     Status: None   Collection Time: 07/01/19  2:27 AM   Specimen: Nasal Mucosa; Nasopharyngeal  Result Value Ref Range   MRSA by PCR NEGATIVE NEGATIVE    Comment:        The GeneXpert MRSA Assay (FDA approved for NASAL specimens only), is one component of a comprehensive MRSA colonization surveillance program. It is not intended to diagnose MRSA infection nor to guide or monitor treatment for MRSA infections. Performed at [facility], [place] [address] [place], [place] [id number]   Basic metabolic panel     Status: Abnormal   Collection Time: 07/01/19  4:33 AM  Result Value Ref Range   Sodium 126  (L) 135 - 145 mmol/L   Potassium 4.6 3.5 - 5.1 mmol/L   Chloride 89 (L) 98 - 111 mmol/L   CO2 28 22 - 32 mmol/L   Glucose, Bld 79 70 - 99 mg/dL   BUN 12 6 - 20 mg/dL   Creatinine, Ser 0.52 0.44 - 1.00 mg/dL   Calcium 8.5 (L) 8.9 - 10.3 mg/dL   GFR calc non Af Amer >60 >60 mL/min   GFR calc Af Amer >60 >60 mL/min   Anion gap 9 5 - 15    Comment: Performed at [place] [address] [place], [place] [address]  CBC     Status: None   Collection Time: 07/01/19  4:33 AM  Result Value Ref Range   WBC 4.4 4.0 - 10.5 K/uL   RBC 4.04 3.87 - 5.11 MIL/uL   Hemoglobin 12.1 12.0 - 15.0 g/dL   HCT 37.2 36.0 - 46.0 %   MCV 92.1 80.0 - 100.0 fL   MCH 30.0 26.0 - 34.0 pg   MCHC 32.5 30.0 - 36.0 g/dL   RDW 14.5 11.5 - 15.5 %   Platelets 175 150 - 400 K/uL   nRBC 0.0 0.0 - 0.2 %    Comment: Performed at [facility], [place] [address] [place],  [address]  Osmolality     Status: Abnormal   Collection Time: 07/01/19  4:33 AM  Result Value Ref  Range   Osmolality 260 (L) 275 - 295 mOsm/kg    Comment: Performed at [place] [address], [place], [place] [address]  Lipase, blood     Status: None   Collection Time: 07/01/19  4:33 AM  Result Value Ref Range   Lipase 25 11 - 51 U/L    Comment: Performed at [facility], [place] [address], [place], [place] [address]  CBC with Differential/Platelet     Status: Abnormal   Collection Time: 07/02/19  4:14 AM  Result Value Ref Range   WBC 5.7 4.0 - 10.5 K/uL   RBC 3.87 3.87 - 5.11 MIL/uL   Hemoglobin 11.6 (L) 12.0 - 15.0 g/dL   HCT 36.0 36.0 - 46.0 %   MCV 93.0 80.0 - 100.0 fL   MCH 30.0 26.0 - 34.0 pg   MCHC 32.2 30.0 - 36.0 g/dL   RDW 14.6 11.5 - 15.5 %   Platelets 168 150 - 400 K/uL   nRBC 0.0 0.0 - 0.2 %   Neutrophils Relative % 65 %   Neutro Abs 3.7 1.7 - 7.7 K/uL   Lymphocytes Relative 22 %   Lymphs Abs 1.2 0.7 - 4.0 K/uL   Monocytes Relative 11 %   Monocytes  Absolute 0.6 0.1 - 1.0 K/uL   Eosinophils Relative 1 %   Eosinophils Absolute 0.1 0.0 - 0.5 K/uL   Basophils Relative 1 %   Basophils Absolute 0.0 0.0 - 0.1 K/uL   Immature Granulocytes 0 %   Abs Immature Granulocytes 0.02 0.00 - 0.07 K/uL    Comment: Performed at [facility], [place] [address], [place], [place] [address]  Comprehensive metabolic panel     Status: Abnormal   Collection Time: 07/02/19  4:14 AM  Result Value Ref Range   Sodium 123 (L) 135 - 145 mmol/L   Potassium 4.7 3.5 - 5.1 mmol/L   Chloride 87 (L) 98 - 111 mmol/L   CO2 27 22 - 32 mmol/L   Glucose, Bld 114 (H) 70 - 99 mg/dL   BUN 13 6 - 20 mg/dL   Creatinine, Ser 0.69 0.44 - 1.00 mg/dL   Calcium 8.2 (L) 8.9 - 10.3 mg/dL   Total Protein 5.9 (L) 6.5 - 8.1 g/dL   Albumin 2.9 (L) 3.5 - 5.0 g/dL   AST 11 (L) 15 - 41 U/L   ALT 10 0 - 44 U/L   Alkaline Phosphatase 76 38 - 126 U/L   Total Bilirubin 0.3 0.3 - 1.2 mg/dL   GFR calc non Af Amer >60 >60 mL/min   GFR calc Af Amer >60 >60 mL/min   Anion gap 9 5 - 15    Comment: Performed at [facility], [place] [address], [place], [place] [address]  Glucose, capillary     Status: Abnormal   Collection Time: 07/02/19  5:04 AM  Result Value Ref Range   Glucose-Capillary 146 (H) 70 - 99 mg/dL  Urinalysis, Routine w reflex microscopic     Status: None   Collection Time: 07/02/19 10:48 AM  Result Value Ref Range   Color, Urine YELLOW YELLOW   APPearance CLEAR CLEAR   Specific Gravity, Urine 1.012 1.005 - 1.030   pH 6.0 5.0 - 8.0   Glucose, UA NEGATIVE NEGATIVE mg/dL   Hgb urine dipstick NEGATIVE NEGATIVE   Bilirubin Urine NEGATIVE NEGATIVE   Ketones, ur NEGATIVE NEGATIVE mg/dL   Protein, ur NEGATIVE NEGATIVE mg/dL   Nitrite NEGATIVE NEGATIVE  Leukocytes,Ua NEGATIVE NEGATIVE    Comment: Performed at [facility], [place] [address], [place], [place] [address]    Medications:  Current Facility-Administered Medications   Medication Dose Route Frequency Provider Last Rate Last Admin  . albuterol (PROVENTIL) (2.5 MG/3ML) 0.083% nebulizer solution 2.5 mg  2.5 mg Nebulization Q4H PRN Tu, Ching T, DO      . aspirin chewable tablet 81 mg  81 mg Oral Daily Tu, Ching T, DO   81 mg at 07/02/19 0923  . benzonatate (TESSALON) capsule 100 mg  100 mg Oral Q8H PRN Tu, Ching T, DO   100 mg at 07/01/19 1701  . calcium carbonate (TUMS - dosed in mg elemental calcium) chewable tablet 500 mg  500 mg Oral Q12H PRN Tu, Ching T, DO   500 mg at 07/01/19 1538  . citalopram (CELEXA) tablet 20 mg  20 mg Oral QHS Tu, Ching T, DO   20 mg at 07/01/19 2110  . clonazePAM (KLONOPIN) tablet 1 mg  1 mg Oral BID Tu, Ching T, DO   1 mg at 07/02/19 0924  . enoxaparin (LOVENOX) injection 40 mg  40 mg Subcutaneous Q24H Tu, Ching T, DO   40 mg at 07/01/19 2111  . fluticasone furoate-vilanterol (BREO ELLIPTA) 100-25 MCG/INH 1 puff  1 puff Inhalation Daily Tu, Ching T, DO   1 puff at 07/02/19 0853  . furosemide (LASIX) tablet 20 mg  20 mg Oral Daily Tu, Ching T, DO   20 mg at 07/02/19 0925  . gabapentin (NEURONTIN) capsule 100 mg  100 mg Oral BID Tu, Ching T, DO   100 mg at 07/02/19 [id number]  . hydrOXYzine (ATARAX/VISTARIL) tablet 50 mg  50 mg Oral TID Tu, Ching T, DO   50 mg at 07/02/19 0925  . ipratropium-albuterol (DUONEB) 0.5-2.5 (3) MG/3ML nebulizer solution 3 mL  3 mL Nebulization TID Tu, Ching T, DO   3 mL at 07/02/19 0853  . lisinopril (ZESTRIL) tablet 40 mg  40 mg Oral Daily Tu, Ching T, DO   40 mg at 07/01/19 0937  . lurasidone (LATUDA) tablet 20 mg  20 mg Oral BID Tu, Ching T, DO   20 mg at 07/02/19 0924  . Melatonin TABS 5 mg  5 mg Oral QHS Tu, Ching T, DO   5 mg at 07/01/19 2109  . metoprolol tartrate (LOPRESSOR) tablet 25 mg  25 mg Oral BID Tu, Ching T, DO   25 mg at 07/02/19 [id number]  . morphine 2 MG/ML injection 0.5 mg  0.5 mg Intravenous Q3H PRN Tu, Ching T, DO      . nicotine (NICODERM CQ - dosed in mg/24 hr) patch 7 mg  7 mg Transdermal Daily  Tu, Ching T, DO   7 mg at 07/02/19 0924  . NIFEdipine (PROCARDIA-XL/NIFEDICAL-XL) 24 hr tablet 90 mg  90 mg Oral Daily Tu, Ching T, DO   90 mg at 07/01/19 0940  . OLANZapine (ZYPREXA) tablet 5 mg  5 mg Oral BID Tu, Ching T, DO   5 mg at 07/02/19 0930  . OLANZapine (ZYPREXA) tablet 5 mg  5 mg Oral Daily PRN Tu, Ching T, DO   5 mg at 07/01/19 2031  . ondansetron (ZOFRAN) injection 4 mg  4 mg Intravenous Q6H PRN Tu, Ching T, DO   4 mg at 07/01/19 0109  . oxyCODONE (Oxy IR/ROXICODONE) immediate release tablet 5 mg  5 mg Oral BID PRN Tu, Ching T, DO   5 mg at 07/02/19  0532  . pantoprazole (PROTONIX) EC tablet 40 mg  40 mg Oral Daily Tu, Ching T, DO   40 mg at 07/02/19 0923  . polyethylene glycol (MIRALAX / GLYCOLAX) packet 17 g  17 g Oral BID Alma Friendly, MD   17 g at 07/01/19 2107  . senna-docusate (Senokot-S) tablet 1 tablet  1 tablet Oral BID Tu, Ching T, DO   1 tablet at 07/01/19 2109  . sodium chloride tablet 1 g  1 g Oral QID Tu, Ching T, DO   1 g at 07/02/19 0924  . valproic acid (DEPAKENE) 250 MG capsule 500 mg  500 mg Oral TID Tu, Ching T, DO   500 mg at 07/02/19 [id number]    Musculoskeletal: Strength & Muscle Tone: Unable to assess Gait & Station: Unable to assess Patient leans: Unable to assess  Psychiatric Specialty Exam: Physical Exam  Nursing note and vitals reviewed. Constitutional: She is oriented to person, place, and time. She appears well-developed.  HENT:  Head: Normocephalic.  Cardiovascular: Normal rate.  Respiratory: Effort normal.  Neurological: She is alert and oriented to person, place, and time.  Psychiatric: She has a normal mood and affect. Her speech is normal and behavior is normal. Judgment normal. Thought content is delusional. Cognition and memory are normal.    Review of Systems  Constitutional: Negative.   HENT: Negative.   Eyes: Negative.   Respiratory: Negative.   Cardiovascular: Negative.   Gastrointestinal: Negative.   Genitourinary:  Negative.   Musculoskeletal: Negative.   Skin: Negative.   Neurological: Negative.     Blood pressure 115/68, pulse 62, temperature 98.4 F (36.9 C), temperature source Oral, resp. rate 18, height 5\' 5"  (1.651 m), weight 87 kg, SpO2 97 %.Body mass index is 31.92 kg/m.  General Appearance: Casual and Fairly Groomed  Eye Contact:  Good  Speech:  Clear and Coherent and Normal Rate  Volume:  Normal  Mood:  Euthymic  Affect:  Appropriate and Congruent  Thought Process:  Coherent, Goal Directed and Descriptions of Associations: Intact  Orientation:  Full (Time, Place, and Person)  Thought Content:  Delusions  Suicidal Thoughts:  No  Homicidal Thoughts:  No  Memory:  Immediate;   Good Recent;   Good Remote;   Good  Judgement:  Fair  Insight:  Good  Psychomotor Activity:  Normal  Concentration:  Concentration: Good and Attention Span: Good  Recall:  Good  Fund of Knowledge:  Fair  Language:  Fair  Akathisia:  No  Handed:  Right  AIMS (if indicated):     Assets:  Communication Skills Desire for Improvement Financial Resources/Insurance Housing Leisure Time Physical Health Resilience Social Support  ADL's: Unable to assess  Cognition:  WNL  Sleep:        Treatment Plan Summary: Continue home medications.  Cleared by psychiatry.  Follow-up with outpatient psychiatric provider and therapist.  Disposition: No evidence of imminent risk to self or others at present.   Patient does not meet criteria for psychiatric inpatient admission. Supportive therapy provided about ongoing stressors. Discussed crisis plan, support from social network, calling 911, coming to the Emergency Department, and calling Suicide Hotline.  This service was provided via telemedicine using a 2-way, interactive audio and video technology.  Names of all persons participating in this telemedicine service and their role in this encounter. Name: Natalie Thomas Role: Patient  Name: Letitia Libra Role: [place], FNP 07/02/2019 12:13 PM

## 2019-07-03 LAB — COMPREHENSIVE METABOLIC PANEL
ALT: 9 U/L (ref 0–44)
AST: 9 U/L — ABNORMAL LOW (ref 15–41)
Albumin: 2.8 g/dL — ABNORMAL LOW (ref 3.5–5.0)
Alkaline Phosphatase: 71 U/L (ref 38–126)
Anion gap: 8 (ref 5–15)
BUN: 28 mg/dL — ABNORMAL HIGH (ref 6–20)
CO2: 29 mmol/L (ref 22–32)
Calcium: 8.4 mg/dL — ABNORMAL LOW (ref 8.9–10.3)
Chloride: 92 mmol/L — ABNORMAL LOW (ref 98–111)
Creatinine, Ser: 0.71 mg/dL (ref 0.44–1.00)
GFR calc Af Amer: >60 mL/min (ref >60)
GFR calc non Af Amer: >60 mL/min (ref >60)
Glucose, Bld: 117 mg/dL — ABNORMAL HIGH (ref 70–99)
Potassium: 5 mmol/L (ref 3.5–5.1)
Sodium: 129 mmol/L — ABNORMAL LOW (ref 135–145)
Total Bilirubin: 0.3 mg/dL (ref 0.3–1.2)
Total Protein: 5.8 g/dL — ABNORMAL LOW (ref 6.5–8.1)

## 2019-07-03 LAB — URINE CULTURE: Culture: NO GROWTH

## 2019-07-03 LAB — CBC WITH DIFFERENTIAL/PLATELET
Abs Immature Granulocytes: 0.02 10*3/uL (ref 0.00–0.07)
Basophils Absolute: 0 10*3/uL (ref 0.0–0.1)
Basophils Relative: 0 %
Eosinophils Absolute: 0.1 10*3/uL (ref 0.0–0.5)
Eosinophils Relative: 1 %
HCT: 36.3 % (ref 36.0–46.0)
Hemoglobin: 11.6 g/dL — ABNORMAL LOW (ref 12.0–15.0)
Immature Granulocytes: 0 %
Lymphocytes Relative: 17 %
Lymphs Abs: 1.1 10*3/uL (ref 0.7–4.0)
MCH: 30.1 pg (ref 26.0–34.0)
MCHC: 32 g/dL (ref 30.0–36.0)
MCV: 94.3 fL (ref 80.0–100.0)
Monocytes Absolute: 0.8 10*3/uL (ref 0.1–1.0)
Monocytes Relative: 12 %
Neutro Abs: 4.8 10*3/uL (ref 1.7–7.7)
Neutrophils Relative %: 70 %
Platelets: 177 10*3/uL (ref 150–400)
RBC: 3.85 MIL/uL — ABNORMAL LOW (ref 3.87–5.11)
RDW: 14.8 % (ref 11.5–15.5)
WBC: 6.8 10*3/uL (ref 4.0–10.5)
nRBC: 0 % (ref 0.0–0.2)

## 2019-07-03 LAB — TROPONIN I (HIGH SENSITIVITY)
Troponin I (High Sensitivity): 3 ng/L (ref <18)
Troponin I (High Sensitivity): 4 ng/L (ref <18)

## 2019-07-03 MED ORDER — ACETAMINOPHEN 325 MG PO TABS
650.0000 mg | ORAL_TABLET | Freq: Four times a day (QID) | ORAL | Status: DC | PRN
  Administered 2019-07-03 – 2019-07-05 (×3): 650 mg via ORAL
  Filled 2019-07-03 (×3): qty 2

## 2019-07-03 NOTE — Plan of Care (Signed)

## 2019-07-03 NOTE — Evaluation (Signed)
Physical Therapy Evaluation Patient Details Name: Natalie Thomas MRN: [id number] DOB: 10/10/1960 Today's Date: 07/03/2019   History of Present Illness  Natalie Thomas is an 59 y.o. female with medical history significant of bipolar/schizoaffective d/o;  HTN; HLD: DM; COPD; chronic hyponatremia; and CHF presenting with difficulty talking.  Initially code stroke called but MRI neg. Psych has made new med recs. HTN crisis upon arrival.   Clinical Impression  Pt admitted with above diagnosis.  Pt perseverating on her fluid restricts however was agreeable to get up to chair for lunch. Will continue to follow in acute setting. anticipate that pt is near her functional baseline and she will be likely be able to return to ALF post acute.   Pt currently with functional limitations due to the deficits listed below (see PT Problem List). Pt will benefit from skilled PT to increase their independence and safety with mobility to allow discharge to the venue listed below.       Follow Up Recommendations No PT follow up(return to ALF)    Equipment Recommendations  None recommended by PT    Recommendations for Other Services       Precautions / Restrictions Precautions Precautions: Fall Restrictions Weight Bearing Restrictions: No      Mobility  Bed Mobility Overal bed mobility: Needs Assistance Bed Mobility: Supine to Sit     Supine to sit: HOB elevated;Min guard     General bed mobility comments: incr time, min/guard for safety   Transfers Overall transfer level: Needs assistance Equipment used: Rolling walker (2 wheeled) Transfers: Sit to/from [facility] Sit to Stand: Min assist;Min guard Stand pivot transfers: Min assist;Min guard       General transfer comment: cues for safety, min to min/guard to maneuver RW   Ambulation/Gait             General Gait Details: NT at this time, lunch arrived  [employer] Mobility     Modified Rankin (Stroke Patients Only)       Balance Overall balance assessment: Needs assistance Sitting-balance support: No upper extremity supported;Feet supported Sitting balance-Leahy Scale: Fair       Standing balance-Leahy Scale: Poor Standing balance comment: reliant on UEs                             Pertinent Vitals/Pain Pain Assessment: Faces Faces Pain Scale: Hurts even more Pain Location: diffuse with movement--non-specific Pain Descriptors / Indicators: Grimacing Pain Intervention(s): Limited activity within patient's tolerance;Monitored during session    Home Living Family/patient expects to be discharged to:: Assisted living               Home Equipment: Walker - 2 wheels      Prior Function           Comments: pt is reluctant to answer some questions. perseverating on fluid restrictions     Hand Dominance        Extremity/Trunk Assessment   Upper Extremity Assessment Upper Extremity Assessment: Overall WFL for tasks assessed;Defer to OT evaluation    Lower Extremity Assessment Lower Extremity Assessment: Overall WFL for tasks assessed       Communication      Cognition Arousal/Alertness: Awake/alert Behavior During Therapy: Agitated Overall Cognitive Status: No family/caregiver present to determine baseline cognitive functioning  General Comments      Exercises     Assessment/Plan    PT Assessment Patient needs continued PT services  PT Problem List Decreased mobility;Decreased activity tolerance;Decreased balance;Pain;Decreased safety awareness       PT Treatment Interventions DME instruction;Therapeutic exercise;Functional mobility training;Therapeutic activities;Patient/family education    PT Goals (Current goals can be found in the Care Plan section)  Acute Rehab PT Goals Patient Stated Goal: unable to state PT Goal Formulation: With  patient Time For Goal Achievement: 07/17/19 Potential to Achieve Goals: Good    Frequency Min 2X/week   Barriers to discharge        Co-evaluation               AM-PAC PT "6 Clicks" Mobility  Outcome Measure Help needed turning from your back to your side while in a flat bed without using bedrails?: A Little Help needed moving from lying on your back to sitting on the side of a flat bed without using bedrails?: A Little Help needed moving to and from a bed to a chair (including a wheelchair)?: A Little Help needed standing up from a chair using your arms (e.g., wheelchair or bedside chair)?: A Little Help needed to walk in hospital room?: A Little Help needed climbing 3-5 steps with a railing? : A Lot 6 Click Score: 17    End of Session Equipment Utilized During Treatment: Gait belt Activity Tolerance: Patient tolerated treatment well Patient left: in chair;with call bell/phone within reach;with chair alarm set   PT Visit Diagnosis: Difficulty in walking, not elsewhere classified (R26.2)    Time: [id number] PT Time Calculation (min) (ACUTE ONLY): 17 min   Charges:   PT Evaluation $PT Eval Low Complexity: [place], PT   Acute Rehab Dept [facility]): [id number]   07/03/2019   [facility] 07/03/2019, 2:43 PM

## 2019-07-03 NOTE — Progress Notes (Signed)
PROGRESS NOTE  Natalie Thomas [id number] DOB: Apr 22, 1961 DOA: 06/30/2019 PCP: System, Pcp Not In  HPI/Recap of past 24 hours: HPI from Dr Candise Bowens Dirocco is a 59 y.o. female with medical history significant of bipolar/schizoaffective disorder, HTN, HLD, DM, COPD on 2L, chronic hyponatremia and diastolic CHF who presents with concerns of nausea, abdominal pain for the past few days. Pt notes diffuse abdominal pain but appears worse on epigastric area. Also thinks it could be due to her umbilical hernia. She reports gagging every morning and then her abdomen would hurt all day. She is unable to describe the characteristic of the pain. Not been able to eat much. Not sure if food makes it worse. Had not had a "good" bowel movement. In the ED, afebrile, hypertensive up tp 198/66 on home 2L via [place]. Sodium of 121, BNP of 273. Pt received Tylenol, pepcid and Maalox in the ED. TRH asked to admit    Today, patient continues to complain of left upper body pain.  Of note, patient constantly complains of pain, despite all work-up negative.  Continues to yell out staff.     Assessment/Plan: Principal Problem:   Abdominal pain Active Problems:   Chronic hyponatremia   Essential hypertension   COPD (chronic obstructive pulmonary disease) (HCC)   Schizoaffective disorder, bipolar type (HCC)   Constipation   Chronic diastolic CHF (congestive heart failure) (HCC)  Abdominal pain/nausea Unknown etiology, hx of chronic constipation/GERD Abdominal X-ray shows moderate stool burden CT abdomen/pelvis unremarkable except for mild thickening along the anterior right wall of the bladder, recommend for signs of cystitis UA negative, UC no growth Suspect this could be ??  Psych related (longstanding delusion) Pain management, PRN Zofran for nausea  Constipation Continue miralax, senna  Acute on chronic Hyponatremia  121 on admit. Appears 130 at baseline, fluctuating Per previous admission,  appears to be concerns of polydipsia. Psych medication also could be contributory Urine sodium 149, osmolality 143, plasma osmolality 260 Status post IV fluids in the ED, start fluid restriction Continue salt tabs  Schizoaffective disorder/bipolar disorder/anxiety Continue Celexa, vistaril, latuda, Zyprexa Pt had numerous medication changes during her last admission in Nov 2020 Psychiatry consulted, no further recs, outpatient follow-up  COPD Reports chronically on 2L  Continue duoneb, Breo Ellipta, Albuterol  Chronic diastolic CHF Appears to be ?compensated BNP 273.7 Continue metoprolol, Lasix, lisinopril, procardia  Hypertension Stable Continue home meds  Tobacco use Advised to quit Continue daily nicotine patch  Obesity Lifestyle modification advised       Malnutrition Type:      Malnutrition Characteristics:      Nutrition Interventions:       Estimated body mass index is 31.92 kg/m as calculated from the following:   Height as of this encounter: 5\' 5"  (1.651 m).   Weight as of this encounter: 87 kg.     Code Status: Full  Family Communication: None at bedside  Disposition Plan: Back to ALF   Consultants:  Psychiatry  Procedures:  None  Antimicrobials:  None  DVT prophylaxis: Lovenox   Objective: Vitals:   07/03/19 0730 07/03/19 0912 07/03/19 1339 07/03/19 1416  BP:  120/80  (!) 161/57  Pulse:    72  Resp:    18  Temp:    98.5 F (36.9 C)  TempSrc:    Oral  SpO2: 96%  96% 96%  Weight:      Height:        Intake/Output Summary (Last 24 hours)  at 07/03/2019 1552 Last data filed at 07/03/2019 1205 Gross per 24 hour  Intake 1140 ml  Output 1700 ml  Net -560 ml   Filed Weights   07/01/19 0007  Weight: 87 kg    Exam:  General:  Agitated, periodically yelling  Cardiovascular: S1, S2 present  Respiratory:  Diminished breath sounds bilaterally  Abdomen: Soft, nontender, nondistended, bowel sounds present,  reducible umbilical hernia  Musculoskeletal: No bilateral pedal edema noted  Skin: Normal  Psychiatry:  Agitated mood   Data Reviewed: CBC: Recent Labs  Lab 06/30/19 1720 07/01/19 0433 07/02/19 0414 07/03/19 0424  WBC 5.1 4.4 5.7 6.8  NEUTROABS 3.1  --  3.7 4.8  HGB 13.3 12.1 11.6* 11.6*  HCT 39.6 37.2 36.0 36.3  MCV 90.2 92.1 93.0 94.3  PLT 175 175 168 [id number]   Basic Metabolic Panel: Recent Labs  Lab 06/30/19 1720 07/01/19 0433 07/02/19 0414 07/03/19 0424  NA 121* 126* 123* 129*  K 4.8 4.6 4.7 5.0  CL 83* 89* 87* 92*  CO2 29 28 27 29   GLUCOSE 80 79 114* 117*  BUN 13 12 13  28*  CREATININE 0.55 0.52 0.69 0.71  CALCIUM 9.0 8.5* 8.2* 8.4*   GFR: Estimated Creatinine Clearance: 84.5 mL/min (by C-G formula based on SCr of 0.71 mg/dL). Liver Function Tests: Recent Labs  Lab 06/30/19 1720 07/02/19 0414 07/03/19 0424  AST 13* 11* 9*  ALT 11 10 9   ALKPHOS 96 76 71  BILITOT 0.6 0.3 0.3  PROT 7.3 5.9* 5.8*  ALBUMIN 3.7 2.9* 2.8*   Recent Labs  Lab 07/01/19 0433  LIPASE 25   No results for input(s): AMMONIA in the last 168 hours. Coagulation Profile: No results for input(s): INR, PROTIME in the last 168 hours. Cardiac Enzymes: No results for input(s): CKTOTAL, CKMB, CKMBINDEX, TROPONINI in the last 168 hours. BNP (last 3 results) No results for input(s): PROBNP in the last 8760 hours. HbA1C: No results for input(s): HGBA1C in the last 72 hours. CBG: Recent Labs  Lab 07/02/19 0504  GLUCAP 146*   Lipid Profile: No results for input(s): CHOL, HDL, LDLCALC, TRIG, CHOLHDL, LDLDIRECT in the last 72 hours. Thyroid Function Tests: No results for input(s): TSH, T4TOTAL, FREET4, T3FREE, THYROIDAB in the last 72 hours. Anemia Panel: No results for input(s): VITAMINB12, FOLATE, FERRITIN, TIBC, IRON, RETICCTPCT in the last 72 hours. Urine analysis:    Component Value Date/Time   COLORURINE YELLOW 07/02/2019 1048   APPEARANCEUR CLEAR 07/02/2019 1048    APPEARANCEUR Clear 06/22/2012 0618   LABSPEC 1.012 07/02/2019 1048   LABSPEC 1.003 06/22/2012 0618   PHURINE 6.0 07/02/2019 1048   GLUCOSEU NEGATIVE 07/02/2019 1048   GLUCOSEU Negative 06/22/2012 0618   GLUCOSEU NEGATIVE 04/09/2011 1058   HGBUR NEGATIVE 07/02/2019 1048   BILIRUBINUR NEGATIVE 07/02/2019 1048   BILIRUBINUR Negative 06/22/2012 0618   KETONESUR NEGATIVE 07/02/2019 1048   PROTEINUR NEGATIVE 07/02/2019 1048   UROBILINOGEN 1.0 08/18/2011 1805   NITRITE NEGATIVE 07/02/2019 1048   LEUKOCYTESUR NEGATIVE 07/02/2019 1048   LEUKOCYTESUR Trace 06/22/2012 0618   Sepsis Labs: @LABRCNTIP (procalcitonin:4,lacticidven:4)  ) Recent Results (from the past 240 hour(s))  SARS CORONAVIRUS 2 (TAT 6-24 HRS) Nasopharyngeal Nasopharyngeal Swab     Status: None   Collection Time: 06/30/19  8:40 PM   Specimen: Nasopharyngeal Swab  Result Value Ref Range Status   SARS Coronavirus 2 NEGATIVE NEGATIVE Final    Comment: (NOTE) SARS-CoV-2 target nucleic acids are NOT DETECTED. The SARS-CoV-2 RNA is generally detectable in upper and  lower respiratory specimens during the acute phase of infection. Negative results do not preclude SARS-CoV-2 infection, do not rule out co-infections with other pathogens, and should not be used as the sole basis for treatment or other patient management decisions. Negative results must be combined with clinical observations, patient history, and epidemiological information. The expected result is Negative. Fact Sheet for Patients: SugarRoll.be Fact Sheet for Healthcare Providers: https://www.woods-mathews.com/ This test is not yet approved or cleared by the [place] FDA and  has been authorized for detection and/or diagnosis of SARS-CoV-2 by FDA under an Emergency Use Authorization (EUA). This EUA will remain  in effect (meaning this test can be used) for the duration of the COVID-19 declaration under Section 56  4(b)(1) of the Act, 21 U.S.C. section 360bbb-3(b)(1), unless the authorization is terminated or revoked sooner. Performed at [facility], [place] [address], [place], [place] [address]   MRSA PCR Screening     Status: None   Collection Time: 07/01/19  2:27 AM   Specimen: Nasal Mucosa; Nasopharyngeal  Result Value Ref Range Status   MRSA by PCR NEGATIVE NEGATIVE Final    Comment:        The GeneXpert MRSA Assay (FDA approved for NASAL specimens only), is one component of a comprehensive MRSA colonization surveillance program. It is not intended to diagnose MRSA infection nor to guide or monitor treatment for MRSA infections. Performed at [facility], [place] [address] [place], [place] [address]   Culture, Urine     Status: None   Collection Time: 07/02/19 10:48 AM   Specimen: Urine, Catheterized  Result Value Ref Range Status   Specimen Description   Final    URINE, CATHETERIZED Performed at [place] [address] [place], [place] [address]    Special Requests   Final    NONE Performed at [facility], [place] [address] [place], [place] [address]    Culture   Final    NO GROWTH Performed at [facility], [place] [address], [place], [place] [address]    Report Status 07/03/2019 FINAL  Final      Studies: No results found.  Scheduled Meds: . aspirin  81 mg Oral Daily  . citalopram  20 mg Oral QHS  . clonazePAM  1 mg Oral BID  . enoxaparin (LOVENOX) injection  40 mg Subcutaneous Q24H  . fluticasone furoate-vilanterol  1 puff Inhalation Daily  . furosemide  20 mg Oral Daily  . gabapentin  100 mg Oral BID  . hydrOXYzine  50 mg Oral TID  . ipratropium-albuterol  3 mL Nebulization TID  . lisinopril  40 mg Oral Daily  . lurasidone  20 mg Oral BID  . Melatonin  5 mg Oral QHS  . metoprolol tartrate  25 mg Oral BID  . nicotine  7 mg Transdermal Daily  . NIFEdipine  90 mg Oral Daily  . OLANZapine  5 mg  Oral BID  . pantoprazole  40 mg Oral Daily  . polyethylene glycol  17 g Oral BID  . senna-docusate  1 tablet Oral BID  . sodium chloride  1 g Oral QID  . valproic acid  500 mg Oral TID    Continuous Infusions:   LOS: 2 days     Alma Friendly, MD [facility]  If 7PM-7AM, please contact night-coverage [email or web address] 07/03/2019, 3:52 PM

## 2019-07-03 NOTE — Progress Notes (Signed)
OT Cancellation Note  Patient Details Name: Natalie Thomas MRN: [id number] DOB: [birth year]   Cancelled Treatment:    Reason Eval/Treat Not Completed: Other (comment). Pt currently agitated, shouting at healthcare staff. Nursing staff in room and attempting to de-escalate. Will try OT eval again at a later date/time.   [name] Schimke, OT Acute Rehabilitation Services Pager: [phone] Office: [phone]  07/03/2019, 10:32 AM

## 2019-07-03 NOTE — Care Management Important Message (Signed)
Important Message  Patient Details IM Letter given to Velva Harman RN Case Manager to present to the Patient Name: Natalie Thomas MRN: [id number] Date of Birth: [birth year]   Medicare Important Message Given:  Yes     Kerin Salen 07/03/2019, 12:55 PM

## 2019-07-04 LAB — COMPREHENSIVE METABOLIC PANEL
ALT: 7 U/L (ref 0–44)
AST: 8 U/L — ABNORMAL LOW (ref 15–41)
Albumin: 2.9 g/dL — ABNORMAL LOW (ref 3.5–5.0)
Alkaline Phosphatase: 66 U/L (ref 38–126)
Anion gap: 8 (ref 5–15)
BUN: 42 mg/dL — ABNORMAL HIGH (ref 6–20)
CO2: 29 mmol/L (ref 22–32)
Calcium: 8.4 mg/dL — ABNORMAL LOW (ref 8.9–10.3)
Chloride: 93 mmol/L — ABNORMAL LOW (ref 98–111)
Creatinine, Ser: 0.89 mg/dL (ref 0.44–1.00)
GFR calc Af Amer: >60 mL/min (ref >60)
GFR calc non Af Amer: >60 mL/min (ref >60)
Glucose, Bld: 95 mg/dL (ref 70–99)
Potassium: 5.1 mmol/L (ref 3.5–5.1)
Sodium: 130 mmol/L — ABNORMAL LOW (ref 135–145)
Total Bilirubin: 0.5 mg/dL (ref 0.3–1.2)
Total Protein: 5.9 g/dL — ABNORMAL LOW (ref 6.5–8.1)

## 2019-07-04 NOTE — Progress Notes (Signed)
Occupational Therapy Evaluation Patient Details Name: Natalie Thomas MRN: [id number] DOB: March 17, 1961 Today's Date: 07/04/2019    History of Present Illness Natalie Thomas is an 59 y.o. female with medical history significant of bipolar/schizoaffective d/o;  HTN; HLD: DM; COPD; chronic hyponatremia; and CHF presenting with difficulty talking.  Initially code stroke called but MRI neg. Psych has made new med recs. HTN crisis upon arrival.    Clinical Impression   PTA, pt was living at ALF, pt agitated upon OT arrival and providing limited answers to questions. When asked prior level of functioning pt stated "I attempt to dress myself" and reports she has not done much walking. Pt currently required minguard for simulated toilet transfer, she requires minA for LB dressing to access bilateral feet, requires supervision for UB dressing and ADL while seated EOB. Anticipate pt is at or close to her baseline functioning. No additional OT needs addressed, recommend return to ALF with available support. OT will sign off. Thank you for referral.     Follow Up Recommendations  No OT follow up(return to ALF)    Equipment Recommendations  None recommended by OT    Recommendations for Other Services       Precautions / Restrictions Precautions Precautions: Fall Restrictions Weight Bearing Restrictions: No      Mobility Bed Mobility Overal bed mobility: Needs Assistance Bed Mobility: Supine to Sit     Supine to sit: Min guard;HOB elevated     General bed mobility comments: incr time, min/guard for safety   Transfers Overall transfer level: Needs assistance Equipment used: Rolling walker (2 wheeled) Transfers: Sit to/from [facility] Sit to Stand: Min guard Stand pivot transfers: Supervision       General transfer comment: cues for safety, minguard for safety    Balance Overall balance assessment: Needs assistance Sitting-balance support: No upper extremity  supported;Feet supported Sitting balance-Leahy Scale: Fair Sitting balance - Comments: able to bend over to adjust socks     Standing balance-Leahy Scale: Poor Standing balance comment: reliant on UEs                           ADL either performed or assessed with clinical judgement   ADL Overall ADL's : Needs assistance/impaired Eating/Feeding: Modified independent   Grooming: Supervision/safety;Standing   Upper Body Bathing: Supervision/ safety;Sitting   Lower Body Bathing: Minimal assistance Lower Body Bathing Details (indicate cue type and reason): minA to access BLE Upper Body Dressing : Supervision/safety;Sitting   Lower Body Dressing: Minimal assistance Lower Body Dressing Details (indicate cue type and reason): minA to access feet, pt able to reach ankles Toilet Transfer: Min guard;Ambulation Toilet Transfer Details (indicate cue type and reason): minguard for safety and line management Toileting- Clothing Manipulation and Hygiene: Min guard;Sit to/from stand Toileting - Clothing Manipulation Details (indicate cue type and reason): no physical assistance provided     Functional mobility during ADLs: Min guard;Rolling walker General ADL Comments: self-limiting behaviors;     Vision         Perception     Praxis      Pertinent Vitals/Pain Pain Assessment: 0-10 Pain Score: 5  Pain Location: abdominal pain Pain Descriptors / Indicators: Grimacing Pain Intervention(s): Limited activity within patient's tolerance;Monitored during session     Hand Dominance Right   Extremity/Trunk Assessment Upper Extremity Assessment Upper Extremity Assessment: Overall WFL for tasks assessed   Lower Extremity Assessment Lower Extremity Assessment: Overall WFL for tasks assessed  Cervical / Trunk Assessment Cervical / Trunk Assessment: Kyphotic   Communication     Cognition Arousal/Alertness: Awake/alert Behavior During Therapy: Agitated Overall Cognitive  Status: No family/caregiver present to determine baseline cognitive functioning                                 General Comments: pt agitated with therapist, appeared skeptical of therapist's validity, questioning the reasoning of everything (ex. therapist tightening O2 connection on wall, pt called in RN to verify);pt requires increased time and encouragement to participate, required NT and RN to motivate pt   General Comments  vss    Exercises     Shoulder Instructions      Home Living Family/patient expects to be discharged to:: Assisted living Living Arrangements: Other (Comment)(Assisted Living Facility)                           Home Equipment: Gilford Rile - 2 wheels   Additional Comments: pt agitated, limited answers provided this session      Prior Functioning/Environment Level of Independence: Needs assistance  Gait / Transfers Assistance Needed: pt reports she wasn't doing much walking prior to arrival, then reported she was using a RW ADL's / Homemaking Assistance Needed: pt stated  "I attempt to dress myself"   Comments: pt is reluctant to answer some questions        OT Problem List: Decreased activity tolerance;Impaired balance (sitting and/or standing);Decreased cognition;Decreased safety awareness;Decreased knowledge of use of DME or AE;Pain;Cardiopulmonary status limiting activity      OT Treatment/Interventions:      OT Goals(Current goals can be found in the care plan section) Acute Rehab OT Goals Patient Stated Goal: to go home OT Goal Formulation: With patient Time For Goal Achievement: 07/18/19 Potential to Achieve Goals: Fair  OT Frequency:     Barriers to D/C:            Co-evaluation              AM-PAC OT "6 Clicks" Daily Activity     Outcome Measure Help from another person eating meals?: None Help from another person taking care of personal grooming?: A Little Help from another person toileting, which includes  using toliet, bedpan, or urinal?: A Little Help from another person bathing (including washing, rinsing, drying)?: A Little Help from another person to put on and taking off regular upper body clothing?: A Little Help from another person to put on and taking off regular lower body clothing?: A Little 6 Click Score: 19   End of Session Equipment Utilized During Treatment: Rolling walker;Gait belt;Oxygen Nurse Communication: Mobility status  Activity Tolerance: Patient tolerated treatment well;Treatment limited secondary to agitation Patient left: in chair;with call bell/phone within reach;with chair alarm set  OT Visit Diagnosis: Unsteadiness on feet (R26.81);Other abnormalities of gait and mobility (R26.89);Muscle weakness (generalized) (M62.81);Other symptoms and signs involving cognitive function;Pain Pain - part of body: (abdomen)                Time: 1130-1157 OT Time Calculation (min): 27 min Charges:  OT General Charges $OT Visit: 1 Visit OT Evaluation $OT Eval Moderate Complexity: 1 Mod OT Treatments $Self Care/Home Management : 8-22 mins  Dorinda Hill OTR/L Acute Rehabilitation Services Office: [phone]   Wyn Forster 07/04/2019, 1:48 PM

## 2019-07-04 NOTE — Progress Notes (Signed)
Complaints of left flank pain. 2 liters  nasal canula.

## 2019-07-04 NOTE — Progress Notes (Signed)
PROGRESS NOTE  Natalie Thomas [id number] DOB: [birth year] DOA: 06/30/2019 PCP: System, Pcp Not In  HPI/Recap of past 24 hours: HPI from Dr Candise Bowens Verge is a 59 y.o. female with medical history significant of bipolar/schizoaffective disorder, HTN, HLD, DM, COPD on 2L, chronic hyponatremia and diastolic CHF who presents with concerns of nausea, abdominal pain for the past few days. Pt notes diffuse abdominal pain but appears worse on epigastric area. Also thinks it could be due to her umbilical hernia. She reports gagging every morning and then her abdomen would hurt all day. She is unable to describe the characteristic of the pain. Not been able to eat much. Not sure if food makes it worse. Had not had a "good" bowel movement. In the ED, afebrile, hypertensive up tp 198/66 on home 2L via [place]. Sodium of 121, BNP of 273. Pt received Tylenol, pepcid and Maalox in the ED. TRH asked to admit     Today, patient upset about not being able to get the amount of liquid she wants.  Denies any new complaints.  Appears agitated    Assessment/Plan: Principal Problem:   Abdominal pain Active Problems:   Chronic hyponatremia   Essential hypertension   COPD (chronic obstructive pulmonary disease) (HCC)   Schizoaffective disorder, bipolar type (HCC)   Constipation   Chronic diastolic CHF (congestive heart failure) (HCC)  Abdominal pain/nausea Unknown etiology, hx of chronic constipation/GERD Abdominal X-ray shows moderate stool burden CT abdomen/pelvis unremarkable except for mild thickening along the anterior right wall of the bladder, recommend for signs of cystitis UA negative, UC no growth Suspect this could be ??  Psych related (longstanding delusion) Pain management, PRN Zofran for nausea  Constipation Continue miralax, senna  Acute on chronic Hyponatremia  121 on admit. Appears 130 at baseline Per previous admission, appears to be concerns of polydipsia. Psych medication  also could be contributory Urine sodium 149, osmolality 143, plasma osmolality 260 Status post IV fluids in the ED, start fluid restriction Continue salt tabs  Schizoaffective disorder/bipolar disorder/anxiety Continue Celexa, vistaril, latuda, Zyprexa Pt had numerous medication changes during her last admission in Nov 2020 Psychiatry consulted, no further recs, outpatient follow-up  COPD Reports chronically on 2L  Continue duoneb, Breo Ellipta, Albuterol  Chronic diastolic CHF Appears to be ?compensated BNP 273.7 Continue metoprolol, Lasix, lisinopril, procardia  Hypertension Stable Continue home meds  Tobacco use Advised to quit Continue daily nicotine patch  Obesity Lifestyle modification advised       Malnutrition Type:      Malnutrition Characteristics:      Nutrition Interventions:       Estimated body mass index is 31.92 kg/m as calculated from the following:   Height as of this encounter: 5\' 5"  (1.651 m).   Weight as of this encounter: 87 kg.     Code Status: Full  Family Communication: Attempted to call legal guardian x2, no answer.   Disposition Plan: Patient is medically stable to discharge, notified TOC, awaiting update   Consultants:  Psychiatry  Procedures:  None  Antimicrobials:  None  DVT prophylaxis: Lovenox   Objective: Vitals:   07/03/19 2132 07/04/19 0652 07/04/19 0835 07/04/19 1309  BP: 107/72 126/77  (!) 145/55  Pulse: 72 65  (!) 57  Resp: 16 14  16   Temp: 98.9 F (37.2 C) 98.2 F (36.8 C)  99.3 F (37.4 C)  TempSrc: Oral Oral  Oral  SpO2: 97% 94% 96% 97%  Weight:  Height:        Intake/Output Summary (Last 24 hours) at 07/04/2019 1445 Last data filed at 07/04/2019 1300 Gross per 24 hour  Intake 730 ml  Output 950 ml  Net -220 ml   Filed Weights   07/01/19 0007  Weight: 87 kg    Exam:  General: NAD, agitated  Cardiovascular: S1, S2 present  Respiratory:  Diminished breath sounds  bilaterally  Abdomen: Soft, nontender, nondistended, bowel sounds present, reducible umbilical hernia  Musculoskeletal: No bilateral pedal edema noted  Skin: Normal  Psychiatry:  Agitated mood   Data Reviewed: CBC: Recent Labs  Lab 06/30/19 1720 07/01/19 0433 07/02/19 0414 07/03/19 0424  WBC 5.1 4.4 5.7 6.8  NEUTROABS 3.1  --  3.7 4.8  HGB 13.3 12.1 11.6* 11.6*  HCT 39.6 37.2 36.0 36.3  MCV 90.2 92.1 93.0 94.3  PLT 175 175 168 [id number]   Basic Metabolic Panel: Recent Labs  Lab 06/30/19 1720 07/01/19 0433 07/02/19 0414 07/03/19 0424 07/04/19 0411  NA 121* 126* 123* 129* 130*  K 4.8 4.6 4.7 5.0 5.1  CL 83* 89* 87* 92* 93*  CO2 29 28 27 29 29   GLUCOSE 80 79 114* 117* 95  BUN 13 12 13  28* 42*  CREATININE 0.55 0.52 0.69 0.71 0.89  CALCIUM 9.0 8.5* 8.2* 8.4* 8.4*   GFR: Estimated Creatinine Clearance: 76 mL/min (by C-G formula based on SCr of 0.89 mg/dL). Liver Function Tests: Recent Labs  Lab 06/30/19 1720 07/02/19 0414 07/03/19 0424 07/04/19 0411  AST 13* 11* 9* 8*  ALT 11 10 9 7   ALKPHOS 96 76 71 66  BILITOT 0.6 0.3 0.3 0.5  PROT 7.3 5.9* 5.8* 5.9*  ALBUMIN 3.7 2.9* 2.8* 2.9*   Recent Labs  Lab 07/01/19 0433  LIPASE 25   No results for input(s): AMMONIA in the last 168 hours. Coagulation Profile: No results for input(s): INR, PROTIME in the last 168 hours. Cardiac Enzymes: No results for input(s): CKTOTAL, CKMB, CKMBINDEX, TROPONINI in the last 168 hours. BNP (last 3 results) No results for input(s): PROBNP in the last 8760 hours. HbA1C: No results for input(s): HGBA1C in the last 72 hours. CBG: Recent Labs  Lab 07/02/19 0504  GLUCAP 146*   Lipid Profile: No results for input(s): CHOL, HDL, LDLCALC, TRIG, CHOLHDL, LDLDIRECT in the last 72 hours. Thyroid Function Tests: No results for input(s): TSH, T4TOTAL, FREET4, T3FREE, THYROIDAB in the last 72 hours. Anemia Panel: No results for input(s): VITAMINB12, FOLATE, FERRITIN, TIBC, IRON,  RETICCTPCT in the last 72 hours. Urine analysis:    Component Value Date/Time   COLORURINE YELLOW 07/02/2019 1048   APPEARANCEUR CLEAR 07/02/2019 1048   APPEARANCEUR Clear 06/22/2012 0618   LABSPEC 1.012 07/02/2019 1048   LABSPEC 1.003 06/22/2012 0618   PHURINE 6.0 07/02/2019 1048   GLUCOSEU NEGATIVE 07/02/2019 1048   GLUCOSEU Negative 06/22/2012 0618   GLUCOSEU NEGATIVE 04/09/2011 1058   HGBUR NEGATIVE 07/02/2019 1048   BILIRUBINUR NEGATIVE 07/02/2019 1048   BILIRUBINUR Negative 06/22/2012 0618   KETONESUR NEGATIVE 07/02/2019 1048   PROTEINUR NEGATIVE 07/02/2019 1048   UROBILINOGEN 1.0 08/18/2011 1805   NITRITE NEGATIVE 07/02/2019 1048   LEUKOCYTESUR NEGATIVE 07/02/2019 1048   LEUKOCYTESUR Trace 06/22/2012 0618   Sepsis Labs: @LABRCNTIP (procalcitonin:4,lacticidven:4)  ) Recent Results (from the past 240 hour(s))  SARS CORONAVIRUS 2 (TAT 6-24 HRS) Nasopharyngeal Nasopharyngeal Swab     Status: None   Collection Time: 06/30/19  8:40 PM   Specimen: Nasopharyngeal Swab  Result Value Ref Range Status  SARS Coronavirus 2 NEGATIVE NEGATIVE Final    Comment: (NOTE) SARS-CoV-2 target nucleic acids are NOT DETECTED. The SARS-CoV-2 RNA is generally detectable in upper and lower respiratory specimens during the acute phase of infection. Negative results do not preclude SARS-CoV-2 infection, do not rule out co-infections with other pathogens, and should not be used as the sole basis for treatment or other patient management decisions. Negative results must be combined with clinical observations, patient history, and epidemiological information. The expected result is Negative. Fact Sheet for Patients: SugarRoll.be Fact Sheet for Healthcare Providers: https://www.woods-mathews.com/ This test is not yet approved or cleared by the [place] FDA and  has been authorized for detection and/or diagnosis of SARS-CoV-2 by FDA under an Emergency  Use Authorization (EUA). This EUA will remain  in effect (meaning this test can be used) for the duration of the COVID-19 declaration under Section 56 4(b)(1) of the Act, 21 U.S.C. section 360bbb-3(b)(1), unless the authorization is terminated or revoked sooner. Performed at [facility], [place] [address] [place], [place] [address]   MRSA PCR Screening     Status: None   Collection Time: 07/01/19  2:27 AM   Specimen: Nasal Mucosa; Nasopharyngeal  Result Value Ref Range Status   MRSA by PCR NEGATIVE NEGATIVE Final    Comment:        The GeneXpert MRSA Assay (FDA approved for NASAL specimens only), is one component of a comprehensive MRSA colonization surveillance program. It is not intended to diagnose MRSA infection nor to guide or monitor treatment for MRSA infections. Performed at [facility], [place] [address], [place], [place] [address]   Culture, Urine     Status: None   Collection Time: 07/02/19 10:48 AM   Specimen: Urine, Catheterized  Result Value Ref Range Status   Specimen Description   Final    URINE, CATHETERIZED Performed at [place] [address] [place], [place] [address]    Special Requests   Final    NONE Performed at [facility], [place] [address] [place], [place] [address]    Culture   Final    NO GROWTH Performed at [facility], [place] [address] [place], [place] [address]    Report Status 07/03/2019 FINAL  Final      Studies: No results found.  Scheduled Meds: . aspirin  81 mg Oral Daily  . citalopram  20 mg Oral QHS  . clonazePAM  1 mg Oral BID  . enoxaparin (LOVENOX) injection  40 mg Subcutaneous Q24H  . fluticasone furoate-vilanterol  1 puff Inhalation Daily  . furosemide  20 mg Oral Daily  . gabapentin  100 mg Oral BID  . hydrOXYzine  50 mg Oral TID  . ipratropium-albuterol  3 mL Nebulization TID  . lisinopril  40 mg Oral Daily  . lurasidone  20 mg Oral BID  .  Melatonin  5 mg Oral QHS  . metoprolol tartrate  25 mg Oral BID  . nicotine  7 mg Transdermal Daily  . NIFEdipine  90 mg Oral Daily  . OLANZapine  5 mg Oral BID  . pantoprazole  40 mg Oral Daily  . polyethylene glycol  17 g Oral BID  . senna-docusate  1 tablet Oral BID  . sodium chloride  1 g Oral QID  . valproic acid  500 mg Oral TID    Continuous Infusions:   LOS: 3 days     Alma Friendly, MD [facility]  If 7PM-7AM, please contact night-coverage [email or web address] 07/04/2019, 2:45 PM

## 2019-07-05 NOTE — TOC Progression Note (Signed)
Transition of Care [facility]) - Progression Note    Patient Details  Name: Natalie Thomas MRN: [id number] Date of Birth: Jan 01, 1961  Transition [facility] CM/SW [place], LCSW Phone Number: 07/05/2019, 4:22 PM  Clinical Narrative:    CSW in contact with [facility] to inform staff that patient is deemed stable and medically ready for discharge. Staff on the phone is agreeable and instructs CSW to arrange transportation.  CSW later receives from call from Lavona M[place] who explains that there is no Systems analyst to oversee admission and that patient could not be transferred until Monday. Cornell Barman goes into detail and instructs CSW to fax over progress notes and dc summary for staff to review on Monday.   TOC team will continue following patient for discharge related needs.  [facility] of Care  Clinical Social Worker  Ph: [phone]   Expected Discharge Plan: Assisted Living Barriers to Discharge: Continued Medical Work up  Expected Discharge Plan and Services Expected Discharge Plan: Assisted Living In-house Referral: Clinical Social Work       Expected Discharge Date: 07/05/19                         [facility] Arranged: NA           Social Determinants of Health (SDOH) Interventions    Readmission Risk Interventions Readmission Risk Prevention Plan 07/01/2019 03/10/2019 12/08/2018  Transportation Screening Complete Complete Complete  Medication Review (RN Care Manager) Referral to Pharmacy Complete Complete  PCP or Specialist appointment within 3-5 days of discharge Complete Complete Complete  HRI or [place] Not Complete Complete Complete  [place] or Home Care Consult Pt Refusal Comments No orders yet, patient is from [facility]. - -  SW Recovery Care/Counseling Consult Complete Complete Complete  Palliative Care Screening Not Applicable Not Applicable Not Applicable  Skilled Nursing Facility Not  Applicable Complete Complete  Some recent data might be hidden

## 2019-07-05 NOTE — Discharge Summary (Signed)
Discharge Summary  Natalie Thomas [id number] DOB: [birth year]  PCP: System, Pcp Not In  Admit date: 06/30/2019 Discharge date: 07/06/2019  Time spent: 35 mins  Recommendations for Outpatient Follow-up:  1. PCP in 1 week 2. Psychiatrist as scheduled  Discharge Diagnoses:  Active Hospital Problems   Diagnosis Date Noted  . Abdominal pain 03/15/2019  . Chronic diastolic CHF (congestive heart failure) ([place]) 03/13/2019  . Constipation   . Schizoaffective disorder, bipolar type ([place]) 07/05/2011  . Essential hypertension 04/08/2007  . Chronic hyponatremia 04/08/2007  . COPD (chronic obstructive pulmonary disease) ([place]) 04/08/2007    Resolved Hospital Problems  No resolved problems to display.    Discharge Condition: Stable  Diet recommendation: 1500cc fluid   Vitals:   07/06/19 0454 07/06/19 0905  BP: 139/79   Pulse: 67 68  Resp: 17 16  Temp: 98.3 F (36.8 C)   SpO2: 97% 97%    History of present illness:  Natalie Thomas a 59 y.o.femalewith medical history significant ofbipolar/schizoaffective disorder, HTN, HLD, DM, COPDon 2L, chronic hyponatremia anddiastolicCHFwho presents with concerns of nausea, abdominal pain for the past few days. Pt notes diffuse abdominal pain but appears worse on epigastric area. Also thinks it could be due to her umbilical hernia. She reports gagging every morning and then her abdomen would hurt all day. She is unable to describe the characteristic of the pain. Not been able to eat much. Not sure if food makes it worse. Had not had a "good" bowel movement. In the ED, afebrile, hypertensiveup tp 198/66 on home 2L via [place]. Sodium of 121, BNP of 273. Pt received Tylenol, pepcid and Maalox in the ED. TR[facility] asked to admit    Patient denies any new complaints.  Stable for discharge back to ALF.  High risk of readmission due to fixations on chronic issues exacerbated by her mental health.  Patient needs to be thoroughly evaluated in the  emergency room prior to requesting admission.    Hospital Course:  Principal Problem:   Abdominal pain Active Problems:   Chronic hyponatremia   Essential hypertension   COPD (chronic obstructive pulmonary disease) (H[facility])   Schizoaffective disorder, bipolar type (H[facility])   Constipation   Chronic diastolic CHF (congestive heart failure) (H[facility])   Abdominal pain/nausea Unknown etiology, hx of chronic constipation/GERD Abdominal X-ray shows moderate stool burden CT abdomen/pelvis unremarkable except for mild thickening along the anterior right wall of the bladder, recommend for signs of cystitis UA negative, UC no growth Suspect this could be ??  Psych related (longstanding delusion) Pain management, PRN Zofran for nausea  Constipation Continue miralax, senna  Acute on chronic Hyponatremia  121 on admit. Appears 130 at baseline Per previous admission, appears to be concerns of polydipsia. Psych medication also could be contributory Urine sodium 149, osmolality 143, plasma osmolality 260 Continue fluid restriction Continue salt tabs  Schizoaffective disorder/bipolar disorder/anxiety Continue Celexa, vistaril, latuda, Zyprexa Psychiatry consulted, no further recs, outpatient follow-up  COPD Reports chronically on 2L  Continue duoneb, Breo Ellipta, Albuterol  Chronic diastolic CHF Appears to be compensated BNP 273.7 Continue metoprolol, Lasix, lisinopril, procardia  Hypertension Stable Continue home meds  Tobacco use Advised to quit Continue daily nicotine patch  Obesity Lifestyle modification advised           Malnutrition Type:      Malnutrition Characteristics:      Nutrition Interventions:      Estimated body mass index is 31.92 kg/m as calculated from the following:   Height as  of this encounter: 5\' 5"  (1.651 m).   Weight as of this encounter: 87 kg.    Procedures:  None  Consultations:  Psychiatry  Discharge Exam: BP  139/79   Pulse 68   Temp 98.3 F (36.8 C) (Oral)   Resp 16   Ht 5\' 5"  (1.651 m)   Wt 87 kg   SpO2 97%   BMI 31.92 kg/m   General: NAD, agitated Cardiovascular: S1, S2 present Respiratory: Diminished breath sounds at the bases  Discharge Instructions You were cared for by a hospitalist during your hospital stay. If you have any questions about your discharge medications or the care you received while you were in the hospital after you are discharged, you can call the unit and asked to speak with the hospitalist on call if the hospitalist that took care of you is not available. Once you are discharged, your primary care physician will handle any further medical issues. Please note that NO REFILLS for any discharge medications will be authorized once you are discharged, as it is imperative that you return to your primary care physician (or establish a relationship with a primary care physician if you do not have one) for your aftercare needs so that they can reassess your need for medications and monitor your lab values.  Discharge Instructions    Diet - low sodium heart healthy   Complete by: As directed    Increase activity slowly   Complete by: As directed    Increase activity slowly   Complete by: As directed      Allergies as of 07/06/2019      Reactions   Clarithromycin Itching   Penicillins Itching   Has tolerated cefazolin before Has patient had a PCN reaction causing immediate rash, facial/tongue/throat swelling, SOB or lightheadedness with hypotension: No Has patient had a PCN reaction causing severe rash involving mucus membranes or skin necrosis: No Has patient had a PCN reaction that required hospitalization: Unknown Has patient had a PCN reaction occurring within the last 10 years: No If all of the above answers are "NO", then may proceed with Cephalosporin use.      Medication List    TAKE these medications   acetaminophen 325 MG tablet Commonly known as:  TYLENOL Take 325 mg by mouth every 6 (six) hours as needed for fever. Not to exceed 2000MG    acetaminophen 500 MG tablet Commonly known as: TYLENOL Take 1,000 mg by mouth every 8 (eight) hours as needed for mild pain or moderate pain. Not to exceed 3g/24h of tylenol from all sources.   albuterol (2.5 MG/3ML) 0.083% nebulizer solution Commonly known as: PROVENTIL Take 3 mLs (2.5 mg total) by nebulization every 4 (four) hours as needed for wheezing or shortness of breath.   ANTACID LIQUID PO Take 30 mLs by mouth every 6 (six) hours as needed (heartburn).   aspirin 81 MG chewable tablet Chew 81 mg by mouth daily. What changed: Another medication with the same name was removed. Continue taking this medication, and follow the directions you see here.   benzonatate 100 MG capsule Commonly known as: TESSALON Take 100 mg by mouth every 8 (eight) hours as needed for cough.   Breo Ellipta 100-25 MCG/INH Aepb Generic drug: fluticasone furoate-vilanterol Inhale 1 puff into the lungs daily.   buPROPion 150 MG 24 hr tablet Commonly known as: WELLBUTRIN XL Take 150 mg by mouth daily.   Calcium 600+D 600-800 MG-UNIT Tabs Generic drug: Calcium Carb-Cholecalciferol Take 1 tablet by mouth  daily.   Calcium Carbonate 500 MG Chew Chew 500 mg by mouth every 12 (twelve) hours as needed (indigestion).   citalopram 20 MG tablet Commonly known as: CELEXA Take 1 tablet (20 mg total) by mouth at bedtime.   clonazePAM 0.5 MG tablet Commonly known as: KLONOPIN Take 0.5 mg by mouth 2 (two) times daily as needed for anxiety. What changed: Another medication with the same name was changed. Make sure you understand how and when to take each.   clonazePAM 1 MG tablet Commonly known as: KLONOPIN Take 1 tablet (1 mg total) by mouth 2 (two) times daily. What changed: when to take this   demeclocycline 150 MG tablet Commonly known as: DECLOMYCIN Take 150 mg by mouth 2 (two) times daily.   docusate  sodium 100 MG capsule Commonly known as: COLACE Take 100 mg by mouth See admin instructions. Take one capsule (100 mg) by mouth every 12 hours, may also take one capsule (100 mg) twice daily as needed for constipation   feeding supplement (ENSURE ENLIVE) Liqd Take 237 mLs by mouth daily.   feeding supplement (PRO-STAT SUGAR FREE 64) Liqd Take 30 mLs by mouth 2 (two) times daily.   fluticasone 50 MCG/ACT nasal spray Commonly known as: FLONASE Place 2 sprays into both nostrils 2 (two) times daily.   furosemide 20 MG tablet Commonly known as: LASIX Take 20 mg by mouth.   gabapentin 100 MG capsule Commonly known as: NEURONTIN Take 1 capsule (100 mg total) by mouth 2 (two) times daily.   guaiFENesin 100 MG/5ML liquid Commonly known as: ROBITUSSIN Take 200 mg by mouth every 8 (eight) hours as needed for cough.   guaiFENesin 600 MG 12 hr tablet Commonly known as: MUCINEX Take 1,200 mg by mouth daily.   hydrOXYzine 50 MG capsule Commonly known as: VISTARIL Take 50 mg by mouth 3 (three) times daily.   ipratropium-albuterol 0.5-2.5 (3) MG/3ML Soln Commonly known as: DUONEB Take 3 mLs by nebulization 3 (three) times daily.   lisinopril 40 MG tablet Commonly known as: ZESTRIL Take 1 tablet (40 mg total) by mouth daily.   loperamide 2 MG tablet Commonly known as: IMODIUM A-D Take 2 mg by mouth every 6 (six) hours as needed for diarrhea or loose stools.   loratadine 10 MG tablet Commonly known as: CLARITIN Take 10 mg by mouth daily.   lurasidone 20 MG Tabs tablet Commonly known as: LATUDA Take 1 tablet (20 mg total) by mouth 2 (two) times daily.   Melatonin 5 MG Tabs Take 5 mg by mouth at bedtime.   methocarbamol 500 MG tablet Commonly known as: ROBAXIN Take 500 mg by mouth daily.   metoprolol tartrate 25 MG tablet Commonly known as: LOPRESSOR Take 1 tablet (25 mg total) by mouth 2 (two) times daily.   multivitamin with minerals Tabs tablet Take 1 tablet by mouth  daily.   neomycin-bacitracin-polymyxin ophthalmic ointment Commonly known as: NEOSPORIN 1 application as needed (minor skin tears or abrasions).   nicotine 7 mg/24hr patch Commonly known as: NICODERM CQ - dosed in mg/24 hr Place 7 mg onto the skin daily.   NIFEdipine 90 MG 24 hr tablet Commonly known as: PROCARDIA XL/NIFEDICAL-XL Take 90 mg by mouth daily.   nystatin powder Generic drug: nystatin Apply 1 application topically 2 (two) times daily as needed (irritation). Apply topically to reddened area twice daily as needed for irritation   OLANZapine 5 MG tablet Commonly known as: ZYPREXA Take 5 mg by mouth daily as needed (  psychotic agitation).   OLANZapine 5 MG tablet Commonly known as: ZYPREXA Take 1 tablet (5 mg total) by mouth 2 (two) times daily.   omeprazole 40 MG capsule Commonly known as: PRILOSEC Take 1 capsule (40 mg total) by mouth at bedtime.   ondansetron 8 MG tablet Commonly known as: ZOFRAN Take 8 mg by mouth every 6 (six) hours as needed for nausea or vomiting.   oxyCODONE 5 MG immediate release tablet Commonly known as: Oxy IR/ROXICODONE Take 5 mg by mouth 2 (two) times daily as needed for severe pain.   polyethylene glycol 17 g packet Commonly known as: MiraLax Take 17 g by mouth daily. What changed: additional instructions   sodium chloride 0.65 % Soln nasal spray Commonly known as: OCEAN Place 2 sprays into both nostrils every 2 (two) hours as needed (dryness, irritation).   sodium chloride 1 g tablet Take 1 tablet (1 g total) by mouth 3 (three) times daily with meals. What changed: when to take this   valproic acid 250 MG capsule Commonly known as: DEPAKENE Take 2 capsules (500 mg total) by mouth 2 (two) times daily. What changed: when to take this   Zinc Gluconate 100 MG Tabs Take 100 mg by mouth daily.      Allergies  Allergen Reactions  . Clarithromycin Itching  . Penicillins Itching    Has tolerated cefazolin before  Has  patient had a PCN reaction causing immediate rash, facial/tongue/throat swelling, SOB or lightheadedness with hypotension: No Has patient had a PCN reaction causing severe rash involving mucus membranes or skin necrosis: No Has patient had a PCN reaction that required hospitalization: Unknown Has patient had a PCN reaction occurring within the last 10 years: No If all of the above answers are "NO", then may proceed with Cephalosporin use.       The results of significant diagnostics from this hospitalization (including imaging, microbiology, ancillary and laboratory) are listed below for reference.    Significant Diagnostic Studies: CT ABDOMEN PELVIS W CONTRAST  Result Date: 07/01/2019 CLINICAL DATA:  Abdominal pain and distension EXAM: CT ABDOMEN AND PELVIS WITH CONTRAST TECHNIQUE: Multidetector CT imaging of the abdomen and pelvis was performed using the standard protocol following bolus administration of intravenous contrast. CONTRAST:  157mL OMNIPAQUE IOHEXOL 300 MG/ML  SOLN COMPARISON:  CT 04/21/2019 FINDINGS: Lower chest: Bilateral small pleural effusions Hepatobiliary: No focal hepatic lesion. Postcholecystectomy. No biliary dilatation. Pancreas: Pancreas is normal. No ductal dilatation. No pancreatic inflammation. Spleen: Normal spleen Adrenals/urinary tract: Nonobstructing calculus in the mid LEFT kidney. Cortical calcification in the RIGHT kidney. Ureters are normal. There is thickening along the anterior RIGHT wall the bladder which is mild measuring 6 mm on image 65/2. Stomach/Bowel: Stomach, small-bowel and cecum are normal. The appendix is not identified but there is no pericecal inflammation to suggest appendicitis. The colon and rectosigmoid colon are normal. Vascular/Lymphatic: Abdominal aorta is normal caliber with atherosclerotic calcification. There is no retroperitoneal or periportal lymphadenopathy. No pelvic lymphadenopathy. Reproductive: Uterus and necks are normal. Other: No  free fluid. Musculoskeletal: Degenerative changes of the spine. Chronic compression fracture T12. IMPRESSION: 1. Small bilateral pleural effusions. 2. Mild thickening along the anterior RIGHT wall of the bladder. Recommend correlation with hematuria or signs of cystitis. 3. Aortic Atherosclerosis (ICD10-I70.0). Electronically Signed   By: Suzy Bouchard M.D.   On: 07/01/2019 20:41   DG Abdomen Acute W/Chest  Result Date: 06/30/2019 CLINICAL DATA:  Abdominal pain EXAM: DG ABDOMEN ACUTE W/ 1V CHEST COMPARISON:  None. FINDINGS:  There is no evidence of dilated bowel loops or free intraperitoneal air. Moderate stool burden. Cholecystectomy clips. Mild chronic interstitial prominence.  Stable cardiomegaly. Bilateral sacral plasty.  Partially imaged right femur fixation. IMPRESSION: No acute process. Electronically Signed   By: Macy Mis M.D.   On: 06/30/2019 17:19    Microbiology: Recent Results (from the past 240 hour(s))  SARS CORONAVIRUS 2 (TAT 6-24 HRS) Nasopharyngeal Nasopharyngeal Swab     Status: None   Collection Time: 06/30/19  8:40 PM   Specimen: Nasopharyngeal Swab  Result Value Ref Range Status   SARS Coronavirus 2 NEGATIVE NEGATIVE Final    Comment: (NOTE) SARS-CoV-2 target nucleic acids are NOT DETECTED. The SARS-CoV-2 RNA is generally detectable in upper and lower respiratory specimens during the acute phase of infection. Negative results do not preclude SARS-CoV-2 infection, do not rule out co-infections with other pathogens, and should not be used as the sole basis for treatment or other patient management decisions. Negative results must be combined with clinical observations, patient history, and epidemiological information. The expected result is Negative. Fact Sheet for Patients: SugarRoll.be Fact Sheet for Healthcare Providers: https://www.woods-mathews.com/ This test is not yet approved or cleared by the [place] FDA and   has been authorized for detection and/or diagnosis of SARS-CoV-2 by FDA under an Emergency Use Authorization (EUA). This EUA will remain  in effect (meaning this test can be used) for the duration of the COVID-19 declaration under Section 56 4(b)(1) of the Act, 21 U.S.C. section 360bbb-3(b)(1), unless the authorization is terminated or revoked sooner. Performed at [facility], [place] [address], [place], [place] [address]   MRSA PCR Screening     Status: None   Collection Time: 07/01/19  2:27 AM   Specimen: Nasal Mucosa; Nasopharyngeal  Result Value Ref Range Status   MRSA by PCR NEGATIVE NEGATIVE Final    Comment:        The GeneXpert MRSA Assay (FDA approved for NASAL specimens only), is one component of a comprehensive MRSA colonization surveillance program. It is not intended to diagnose MRSA infection nor to guide or monitor treatment for MRSA infections. Performed at [facility], [place] [address], [place], [place] [address]   Culture, Urine     Status: None   Collection Time: 07/02/19 10:48 AM   Specimen: Urine, Catheterized  Result Value Ref Range Status   Specimen Description   Final    URINE, CATHETERIZED Performed at [place] [address], [place], [place] [address]    Special Requests   Final    NONE Performed at [facility], [place] [address] [place], [place] [address]    Culture   Final    NO GROWTH Performed at [facility], [place] [address], [place],  [address]    Report Status 07/03/2019 FINAL  Final     Labs: Basic Metabolic Panel: Recent Labs  Lab 06/30/19 1720 07/01/19 0433 07/02/19 0414 07/03/19 0424 07/04/19 0411  NA 121* 126* 123* 129* 130*  K 4.8 4.6 4.7 5.0 5.1  CL 83* 89* 87* 92* 93*  CO2 29 28 27 29 29   GLUCOSE 80 79 114* 117* 95  BUN 13 12 13  28* 42*  CREATININE 0.55 0.52 0.69 0.71 0.89  CALCIUM 9.0 8.5* 8.2* 8.4* 8.4*   Liver Function Tests: Recent  Labs  Lab 06/30/19 1720 07/02/19 0414 07/03/19 0424 07/04/19 0411  AST 13* 11* 9* 8*  ALT 11 10 9  7  ALKPHOS 96 76 71 66  BILITOT 0.6 0.3 0.3 0.5  PROT 7.3 5.9* 5.8* 5.9*  ALBUMIN 3.7 2.9* 2.8* 2.9*   Recent Labs  Lab 07/01/19 0433  LIPASE 25   No results for input(s): AMMONIA in the last 168 hours. CBC: Recent Labs  Lab 06/30/19 1720 07/01/19 0433 07/02/19 0414 07/03/19 0424  WBC 5.1 4.4 5.7 6.8  NEUTROABS 3.1  --  3.7 4.8  HGB 13.3 12.1 11.6* 11.6*  HCT 39.6 37.2 36.0 36.3  MCV 90.2 92.1 93.0 94.3  PLT 175 175 168 177   Cardiac Enzymes: No results for input(s): CKTOTAL, CKMB, CKMBINDEX, TROPONINI in the last 168 hours. BNP: BNP (last 3 results) Recent Labs    12/03/18 0957 12/06/18 0911 06/30/19 1720  BNP 296.6* 132.3* 273.7*    ProBNP (last 3 results) No results for input(s): PROBNP in the last 8760 hours.  CBG: Recent Labs  Lab 07/02/19 0504  GLUCAP 146*       Signed:  Alma Friendly, MD [facility] 07/06/2019, 10:47 AM

## 2019-07-05 NOTE — Progress Notes (Signed)
PROGRESS NOTE  Natalie Thomas [id number] DOB: 1961/05/21 DOA: 06/30/2019 PCP: System, Pcp Not In  HPI/Recap of past 24 hours: HPI from Dr Candise Bowens Colley is a 59 y.o. female with medical history significant of bipolar/schizoaffective disorder, HTN, HLD, DM, COPD on 2L, chronic hyponatremia and diastolic CHF who presents with concerns of nausea, abdominal pain for the past few days. Pt notes diffuse abdominal pain but appears worse on epigastric area. Also thinks it could be due to her umbilical hernia. She reports gagging every morning and then her abdomen would hurt all day. She is unable to describe the characteristic of the pain. Not been able to eat much. Not sure if food makes it worse. Had not had a "good" bowel movement. In the ED, afebrile, hypertensive up tp 198/66 on home 2L via [place]. Sodium of 121, BNP of 273. Pt received Tylenol, pepcid and Maalox in the ED. TRH asked to admit    Today, patient denies any new complaints, requesting fluid restriction to be stopped    Assessment/Plan: Principal Problem:   Abdominal pain Active Problems:   Chronic hyponatremia   Essential hypertension   COPD (chronic obstructive pulmonary disease) (HCC)   Schizoaffective disorder, bipolar type (HCC)   Constipation   Chronic diastolic CHF (congestive heart failure) (HCC)  Abdominal pain/nausea Unknown etiology, hx of chronic constipation/GERD Abdominal X-ray shows moderate stool burden CT abdomen/pelvis unremarkable except for mild thickening along the anterior right wall of the bladder, recommend for signs of cystitis UA negative, UC no growth Suspect this could be ??  Psych related (longstanding delusion) Pain management, PRN Zofran for nausea  Constipation Continue miralax, senna  Acute on chronic Hyponatremia  121 on admit. Appears 130 at baseline Per previous admission, appears to be concerns of polydipsia. Psych medication also could be contributory Urine sodium 149,  osmolality 143, plasma osmolality 260 Status post IV fluids in the ED, start fluid restriction Continue salt tabs  Schizoaffective disorder/bipolar disorder/anxiety Continue Celexa, vistaril, latuda, Zyprexa Pt had numerous medication changes during her last admission in Nov 2020 Psychiatry consulted, no further recs, outpatient follow-up  COPD Reports chronically on 2L  Continue duoneb, Breo Ellipta, Albuterol  Chronic diastolic CHF Appears to be ?compensated BNP 273.7 Continue metoprolol, Lasix, lisinopril, procardia  Hypertension Stable Continue home meds  Tobacco use Advised to quit Continue daily nicotine patch  Obesity Lifestyle modification advised       Malnutrition Type:      Malnutrition Characteristics:      Nutrition Interventions:       Estimated body mass index is 31.92 kg/m as calculated from the following:   Height as of this encounter: 5\' 5"  (1.651 m).   Weight as of this encounter: 87 kg.     Code Status: Full  Family Communication: Attempted to call legal guardian x2, no answer on 07/04/2019 and 07/05/2019  Disposition Plan: Patient is medically stable to discharge, notified TOC, facility will not be able to take patient due to staffing issues on 07/06/2019   Consultants:  Psychiatry  Procedures:  None  Antimicrobials:  None  DVT prophylaxis: Lovenox   Objective: Vitals:   07/04/19 2120 07/04/19 2145 07/05/19 0447 07/05/19 0924  BP:  (!) 102/57 117/77   Pulse:  68 61   Resp:  16 18   Temp:  98.4 F (36.9 C) 99.1 F (37.3 C)   TempSrc:  Oral Oral   SpO2: 95% 95% 97% 94%  Weight:  Height:        Intake/Output Summary (Last 24 hours) at 07/05/2019 1316 Last data filed at 07/05/2019 0946 Gross per 24 hour  Intake 1517 ml  Output 2220 ml  Net -703 ml   Filed Weights   07/01/19 0007  Weight: 87 kg    Exam:  General: NAD, agitated  Cardiovascular: S1, S2 present  Respiratory:  Diminished  breath sounds at the bases bilaterally  Abdomen: Soft, nontender, nondistended, bowel sounds present, reducible umbilical hernia  Musculoskeletal: No bilateral pedal edema noted  Skin: Normal  Psychiatry:  Agitated mood   Data Reviewed: CBC: Recent Labs  Lab 06/30/19 1720 07/01/19 0433 07/02/19 0414 07/03/19 0424  WBC 5.1 4.4 5.7 6.8  NEUTROABS 3.1  --  3.7 4.8  HGB 13.3 12.1 11.6* 11.6*  HCT 39.6 37.2 36.0 36.3  MCV 90.2 92.1 93.0 94.3  PLT 175 175 168 [id number]   Basic Metabolic Panel: Recent Labs  Lab 06/30/19 1720 07/01/19 0433 07/02/19 0414 07/03/19 0424 07/04/19 0411  NA 121* 126* 123* 129* 130*  K 4.8 4.6 4.7 5.0 5.1  CL 83* 89* 87* 92* 93*  CO2 29 28 27 29 29   GLUCOSE 80 79 114* 117* 95  BUN 13 12 13  28* 42*  CREATININE 0.55 0.52 0.69 0.71 0.89  CALCIUM 9.0 8.5* 8.2* 8.4* 8.4*   GFR: Estimated Creatinine Clearance: 76 mL/min (by C-G formula based on SCr of 0.89 mg/dL). Liver Function Tests: Recent Labs  Lab 06/30/19 1720 07/02/19 0414 07/03/19 0424 07/04/19 0411  AST 13* 11* 9* 8*  ALT 11 10 9 7   ALKPHOS 96 76 71 66  BILITOT 0.6 0.3 0.3 0.5  PROT 7.3 5.9* 5.8* 5.9*  ALBUMIN 3.7 2.9* 2.8* 2.9*   Recent Labs  Lab 07/01/19 0433  LIPASE 25   No results for input(s): AMMONIA in the last 168 hours. Coagulation Profile: No results for input(s): INR, PROTIME in the last 168 hours. Cardiac Enzymes: No results for input(s): CKTOTAL, CKMB, CKMBINDEX, TROPONINI in the last 168 hours. BNP (last 3 results) No results for input(s): PROBNP in the last 8760 hours. HbA1C: No results for input(s): HGBA1C in the last 72 hours. CBG: Recent Labs  Lab 07/02/19 0504  GLUCAP 146*   Lipid Profile: No results for input(s): CHOL, HDL, LDLCALC, TRIG, CHOLHDL, LDLDIRECT in the last 72 hours. Thyroid Function Tests: No results for input(s): TSH, T4TOTAL, FREET4, T3FREE, THYROIDAB in the last 72 hours. Anemia Panel: No results for input(s): VITAMINB12, FOLATE,  FERRITIN, TIBC, IRON, RETICCTPCT in the last 72 hours. Urine analysis:    Component Value Date/Time   COLORURINE YELLOW 07/02/2019 1048   APPEARANCEUR CLEAR 07/02/2019 1048   APPEARANCEUR Clear 06/22/2012 0618   LABSPEC 1.012 07/02/2019 1048   LABSPEC 1.003 06/22/2012 0618   PHURINE 6.0 07/02/2019 1048   GLUCOSEU NEGATIVE 07/02/2019 1048   GLUCOSEU Negative 06/22/2012 0618   GLUCOSEU NEGATIVE 04/09/2011 1058   HGBUR NEGATIVE 07/02/2019 1048   BILIRUBINUR NEGATIVE 07/02/2019 1048   BILIRUBINUR Negative 06/22/2012 0618   KETONESUR NEGATIVE 07/02/2019 1048   PROTEINUR NEGATIVE 07/02/2019 1048   UROBILINOGEN 1.0 08/18/2011 1805   NITRITE NEGATIVE 07/02/2019 1048   LEUKOCYTESUR NEGATIVE 07/02/2019 1048   LEUKOCYTESUR Trace 06/22/2012 0618   Sepsis Labs: @LABRCNTIP (procalcitonin:4,lacticidven:4)  ) Recent Results (from the past 240 hour(s))  SARS CORONAVIRUS 2 (TAT 6-24 HRS) Nasopharyngeal Nasopharyngeal Swab     Status: None   Collection Time: 06/30/19  8:40 PM   Specimen: Nasopharyngeal Swab  Result Value  Ref Range Status   SARS Coronavirus 2 NEGATIVE NEGATIVE Final    Comment: (NOTE) SARS-CoV-2 target nucleic acids are NOT DETECTED. The SARS-CoV-2 RNA is generally detectable in upper and lower respiratory specimens during the acute phase of infection. Negative results do not preclude SARS-CoV-2 infection, do not rule out co-infections with other pathogens, and should not be used as the sole basis for treatment or other patient management decisions. Negative results must be combined with clinical observations, patient history, and epidemiological information. The expected result is Negative. Fact Sheet for Patients: SugarRoll.be Fact Sheet for Healthcare Providers: https://www.woods-mathews.com/ This test is not yet approved or cleared by the [place] FDA and  has been authorized for detection and/or diagnosis of SARS-CoV-2  by FDA under an Emergency Use Authorization (EUA). This EUA will remain  in effect (meaning this test can be used) for the duration of the COVID-19 declaration under Section 56 4(b)(1) of the Act, 21 U.S.C. section 360bbb-3(b)(1), unless the authorization is terminated or revoked sooner. Performed at [facility], [place] [address] [place], [place] [address]   MRSA PCR Screening     Status: None   Collection Time: 07/01/19  2:27 AM   Specimen: Nasal Mucosa; Nasopharyngeal  Result Value Ref Range Status   MRSA by PCR NEGATIVE NEGATIVE Final    Comment:        The GeneXpert MRSA Assay (FDA approved for NASAL specimens only), is one component of a comprehensive MRSA colonization surveillance program. It is not intended to diagnose MRSA infection nor to guide or monitor treatment for MRSA infections. Performed at [facility], [place] [address] [place], [place] [address]   Culture, Urine     Status: None   Collection Time: 07/02/19 10:48 AM   Specimen: Urine, Catheterized  Result Value Ref Range Status   Specimen Description   Final    URINE, CATHETERIZED Performed at [place] [address] [place], [place] [address]    Special Requests   Final    NONE Performed at [facility], [place] [address], [place], [place] [address]    Culture   Final    NO GROWTH Performed at [facility], [place] [address] [place], [place] [address]    Report Status 07/03/2019 FINAL  Final      Studies: No results found.  Scheduled Meds: . aspirin  81 mg Oral Daily  . citalopram  20 mg Oral QHS  . clonazePAM  1 mg Oral BID  . enoxaparin (LOVENOX) injection  40 mg Subcutaneous Q24H  . fluticasone furoate-vilanterol  1 puff Inhalation Daily  . furosemide  20 mg Oral Daily  . gabapentin  100 mg Oral BID  . hydrOXYzine  50 mg Oral TID  . ipratropium-albuterol  3 mL Nebulization TID  . lisinopril  40 mg Oral Daily  .  lurasidone  20 mg Oral BID  . Melatonin  5 mg Oral QHS  . metoprolol tartrate  25 mg Oral BID  . nicotine  7 mg Transdermal Daily  . NIFEdipine  90 mg Oral Daily  . OLANZapine  5 mg Oral BID  . pantoprazole  40 mg Oral Daily  . polyethylene glycol  17 g Oral BID  . senna-docusate  1 tablet Oral BID  . sodium chloride  1 g Oral QID  . valproic acid  500 mg Oral TID    Continuous Infusions:   LOS: 4 days     Jarvis Newcomer  Kendell Bane, MD [facility]  If 7PM-7AM, please contact night-coverage [email or web address] 07/05/2019, 1:16 PM

## 2019-07-06 ENCOUNTER — Emergency Department (HOSPITAL_COMMUNITY)
Admission: EM | Admit: 2019-07-06 | Discharge: 2019-07-06 | Disposition: A | Discharge status: Home / Self Care | Payer: Medicare Other | Source: Home / Self Care | Attending: Emergency Medicine | Admitting: Emergency Medicine

## 2019-07-06 ENCOUNTER — Other Ambulatory Visit: Payer: Self-pay

## 2019-07-06 ENCOUNTER — Encounter (HOSPITAL_COMMUNITY): Payer: Self-pay

## 2019-07-06 DIAGNOSIS — Z7982 Long term (current) use of aspirin: Secondary | ICD-10-CM | POA: Insufficient documentation

## 2019-07-06 DIAGNOSIS — N179 Acute kidney failure, unspecified: Secondary | ICD-10-CM | POA: Diagnosis not present

## 2019-07-06 DIAGNOSIS — E119 Type 2 diabetes mellitus without complications: Secondary | ICD-10-CM | POA: Insufficient documentation

## 2019-07-06 DIAGNOSIS — J449 Chronic obstructive pulmonary disease, unspecified: Secondary | ICD-10-CM | POA: Insufficient documentation

## 2019-07-06 DIAGNOSIS — E871 Hypo-osmolality and hyponatremia: Secondary | ICD-10-CM

## 2019-07-06 DIAGNOSIS — F17210 Nicotine dependence, cigarettes, uncomplicated: Secondary | ICD-10-CM | POA: Insufficient documentation

## 2019-07-06 DIAGNOSIS — I110 Hypertensive heart disease with heart failure: Secondary | ICD-10-CM | POA: Insufficient documentation

## 2019-07-06 DIAGNOSIS — I5032 Chronic diastolic (congestive) heart failure: Secondary | ICD-10-CM | POA: Insufficient documentation

## 2019-07-06 DIAGNOSIS — Z79899 Other long term (current) drug therapy: Secondary | ICD-10-CM | POA: Insufficient documentation

## 2019-07-06 MED ORDER — OLANZAPINE 5 MG PO TABS
5.0000 mg | ORAL_TABLET | Freq: Two times a day (BID) | ORAL | Status: DC
  Administered 2019-07-06: 22:00:00 5 mg via ORAL
  Filled 2019-07-06: qty 1

## 2019-07-06 MED ORDER — MELATONIN 5 MG PO TABS
5.0000 mg | ORAL_TABLET | Freq: Every day | ORAL | Status: DC
  Administered 2019-07-06: 23:00:00 5 mg via ORAL
  Filled 2019-07-06: qty 1

## 2019-07-06 MED ORDER — PANTOPRAZOLE SODIUM 40 MG PO TBEC: 80.0000 mg | DELAYED_RELEASE_TABLET | Freq: Every day | ORAL | Status: DC

## 2019-07-06 MED ORDER — BUPROPION HCL ER (XL) 150 MG PO TB24: 150.0000 mg | ORAL_TABLET | Freq: Every day | ORAL | Status: DC

## 2019-07-06 MED ORDER — CITALOPRAM HYDROBROMIDE 10 MG PO TABS
20.0000 mg | ORAL_TABLET | Freq: Every day | ORAL | Status: DC
  Administered 2019-07-06: 20 mg via ORAL
  Filled 2019-07-06: qty 2

## 2019-07-06 MED ORDER — GABAPENTIN 100 MG PO CAPS
100.0000 mg | ORAL_CAPSULE | Freq: Two times a day (BID) | ORAL | Status: DC
  Administered 2019-07-06: 23:00:00 100 mg via ORAL
  Filled 2019-07-06: qty 1

## 2019-07-06 MED ORDER — NIFEDIPINE ER OSMOTIC RELEASE 60 MG PO TB24: 90.0000 mg | ORAL_TABLET | Freq: Every day | ORAL | Status: DC

## 2019-07-06 MED ORDER — HYDROXYZINE HCL 50 MG PO TABS
50.0000 mg | ORAL_TABLET | Freq: Three times a day (TID) | ORAL | Status: DC
  Administered 2019-07-06: 23:00:00 50 mg via ORAL
  Filled 2019-07-06: qty 1

## 2019-07-06 MED ORDER — GUAIFENESIN ER 600 MG PO TB12: 1200.0000 mg | ORAL_TABLET | Freq: Every day | ORAL | Status: DC

## 2019-07-06 MED ORDER — FLUTICASONE PROPIONATE 50 MCG/ACT NA SUSP
2.0000 | Freq: Two times a day (BID) | NASAL | Status: DC
  Administered 2019-07-06: 23:00:00 2 via NASAL
  Filled 2019-07-06: qty 16

## 2019-07-06 MED ORDER — FLUTICASONE FUROATE-VILANTEROL 100-25 MCG/INH IN AEPB: 1.0000 | INHALATION_SPRAY | Freq: Every day | RESPIRATORY_TRACT | Status: DC

## 2019-07-06 MED ORDER — CLONAZEPAM 0.5 MG PO TABS
0.5000 mg | ORAL_TABLET | Freq: Two times a day (BID) | ORAL | Status: DC | PRN
  Administered 2019-07-06: 0.5 mg via ORAL
  Filled 2019-07-06: qty 1

## 2019-07-06 MED ORDER — LURASIDONE HCL 20 MG PO TABS
20.0000 mg | ORAL_TABLET | Freq: Two times a day (BID) | ORAL | Status: DC
  Administered 2019-07-06: 20 mg via ORAL
  Filled 2019-07-06: qty 1

## 2019-07-06 MED ORDER — LISINOPRIL 20 MG PO TABS: 40.0000 mg | ORAL_TABLET | Freq: Every day | ORAL | Status: DC

## 2019-07-06 MED ORDER — LORATADINE 10 MG PO TABS: 10.0000 mg | ORAL_TABLET | Freq: Every day | ORAL | Status: DC

## 2019-07-06 MED ORDER — ASPIRIN 81 MG PO CHEW: 81.0000 mg | CHEWABLE_TABLET | Freq: Every day | ORAL | Status: DC

## 2019-07-06 MED ORDER — METOPROLOL TARTRATE 25 MG PO TABS
25.0000 mg | ORAL_TABLET | Freq: Two times a day (BID) | ORAL | Status: DC
  Administered 2019-07-06: 25 mg via ORAL
  Filled 2019-07-06: qty 1

## 2019-07-06 MED ORDER — BENZONATATE 100 MG PO CAPS: 100.0000 mg | ORAL_CAPSULE | Freq: Three times a day (TID) | ORAL | Status: DC | PRN

## 2019-07-06 MED ORDER — DOCUSATE SODIUM 100 MG PO CAPS: 100.0000 mg | ORAL_CAPSULE | ORAL | Status: DC

## 2019-07-06 MED ORDER — FUROSEMIDE 20 MG PO TABS: 20.0000 mg | ORAL_TABLET | Freq: Every day | ORAL | Status: DC

## 2019-07-06 MED ORDER — VALPROIC ACID 250 MG PO CAPS
500.0000 mg | ORAL_CAPSULE | Freq: Two times a day (BID) | ORAL | Status: DC
  Administered 2019-07-06: 23:00:00 500 mg via ORAL
  Filled 2019-07-06: qty 2

## 2019-07-06 MED ORDER — FLUTICASONE FUROATE-VILANTEROL 100-25 MCG/INH IN AEPB
1.0000 | INHALATION_SPRAY | Freq: Every day | RESPIRATORY_TRACT | Status: DC
  Filled 2019-07-06: qty 28

## 2019-07-06 MED ORDER — NICOTINE 7 MG/24HR TD PT24: 7.0000 mg | MEDICATED_PATCH | Freq: Every day | TRANSDERMAL | Status: DC

## 2019-07-06 MED ORDER — SODIUM CHLORIDE 1 G PO TABS
1.0000 g | ORAL_TABLET | Freq: Three times a day (TID) | ORAL | Status: DC
  Filled 2019-07-06: qty 1

## 2019-07-06 MED ORDER — DEMECLOCYCLINE HCL 150 MG PO TABS
150.0000 mg | ORAL_TABLET | Freq: Two times a day (BID) | ORAL | Status: DC
  Administered 2019-07-06: 23:00:00 150 mg via ORAL
  Filled 2019-07-06: qty 1

## 2019-07-06 NOTE — [place] FL2 (Signed)
[place] MEDICAID FL2 LEVEL OF CARE SCREENING TOOL     IDENTIFICATION  Patient Name: Natalie Thomas Birthdate: 1960/07/27 Sex: female Admission Date (Current Location): 07/06/2019  [facility] and [place] Number:  Herbalist and Address:  [facility],  [place] [address], [place]      Provider Number: [id number]  Attending Physician Name and Address:  Lajean Saver, MD  Relative Name and Phone Number:       Current Level of Care: Hospital(ALF) Recommended Level of Care: Assisted Living Facility Prior Approval Number:    Date Approved/Denied:   PASRR Number:    Discharge Plan: (ALF)    Current Diagnoses: Patient Active Problem List   Diagnosis Date Noted  . Hypertensive crisis 05/01/2019  . Abdominal pain 03/15/2019  . Pleural effusion 03/13/2019  . Atelectasis 03/13/2019  . Chronic diastolic CHF (congestive heart failure) ([place]) 03/13/2019  . Type 2 diabetes mellitus without complication ([place]) [id number]  . Urinary tract bacterial infections 03/09/2019  . Acute cystitis without hematuria   . Constipation   . Acute hypoxemic respiratory failure ([place]) 12/03/2018  . Irritability 11/09/2018  . Aggressive behavior 11/09/2018  . Agitation   . S/P right hip fracture 11/07/2017  . Closed comminuted intertrochanteric fracture of right femur ([place]) 11/07/2017  . Anxiety and depression   . Dyspnea 10/16/2017  . Anemia 10/16/2017  . COPD exacerbation ([place]) 10/07/2017  . Hyperlipidemia 10/07/2017  . Acute on chronic respiratory failure with hypoxia and hypercapnia ([place]) 10/07/2017  . Incontinence of urine 08/14/2011  . Schizoaffective disorder, bipolar type ([place]) 07/05/2011  . Cough 05/08/2011  . Lumbar disc disease 04/09/2011  . Polycythemia secondary to smoking 04/09/2011  . HIP PAIN, LEFT 12/12/2007  . 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 DM (diabetes mellitus), type 2 ([place]) 04/08/2007  . MENOPAUSE, PREMATURE 04/08/2007  . Chronic  hyponatremia 04/08/2007  . Essential hypertension 04/08/2007  . COPD (chronic obstructive pulmonary disease) ([place]) 04/08/2007  . GERD 04/08/2007  . Osteoporosis 04/08/2007    Orientation RESPIRATION BLADDER Height & Weight     Self, Time, Situation, Place  O2(2 liter nasal cannula continuous)  Continent(Request a purewick for convenience, per the pt.) Weight: 191 lb 12.8 oz (87 kg) Height:  5\' 5"  (165.1 cm)  BEHAVIORAL SYMPTOMS/MOOD NEUROLOGICAL BOWEL NUTRITION STATUS      Continent Diet(Regular)  AMBULATORY STATUS COMMUNICATION OF NEEDS Skin   Independent(Walks with a walker at times) Verbally Normal                       Personal Care Assistance Level of Assistance  Bathing, Dressing Bathing Assistance: Limited assistance   Dressing Assistance: Limited assistance     Functional Limitations Info             SPECIAL CARE FACTORS FREQUENCY                       Contractures Contractures Info: Not present    Additional Factors Info  Code Status Code Status Info: FULL CODE Allergies Info: Clarithromycin, Penicillins           Current Medications (07/06/2019):  This is the current hospital active medication list Current Facility-Administered Medications  Medication Dose Route Frequency Provider Last Rate Last Admin  . aspirin chewable tablet 81 mg  81 mg Oral Daily Joy, Shawn C, PA-C      . benzonatate (TESSALON) capsule 100 mg  100 mg Oral Q8H PRN Joy, Shawn C,  PA-C      . Derrill Memo ON 07/07/2019] buPROPion (WELLBUTRIN XL) 24 hr tablet 150 mg  150 mg Oral Daily Joy, Shawn C, PA-C      . citalopram (CELEXA) tablet 20 mg  20 mg Oral QHS Joy, Shawn C, PA-C      . clonazePAM (KLONOPIN) tablet 0.5 mg  0.5 mg Oral BID PRN Joy, Shawn C, PA-C      . demeclocycline (DECLOMYCIN) tablet 150 mg  150 mg Oral BID Joy, Shawn C, PA-C      . docusate sodium (COLACE) capsule 100 mg  100 mg Oral See admin instructions Joy, Shawn C, PA-C      . fluticasone (FLONASE) 50 MCG/ACT  nasal spray 2 spray  2 spray Each Nare BID Joy, Shawn C, PA-C      . [START ON 07/07/2019] fluticasone furoate-vilanterol (BREO ELLIPTA) 100-25 MCG/INH 1 puff  1 puff Inhalation Daily Lenis Noon, RPH      . [START ON 07/07/2019] furosemide (LASIX) tablet 20 mg  20 mg Oral Daily Joy, Shawn C, PA-C      . gabapentin (NEURONTIN) capsule 100 mg  100 mg Oral BID Joy, Shawn C, PA-C      . [START ON 07/07/2019] guaiFENesin (MUCINEX) 12 hr tablet 1,200 mg  1,200 mg Oral Daily Joy, Shawn C, PA-C      . hydrOXYzine (ATARAX/VISTARIL) tablet 50 mg  50 mg Oral TID Joy, Shawn C, PA-C      . [START ON 07/07/2019] lisinopril (ZESTRIL) tablet 40 mg  40 mg Oral Daily Joy, Shawn C, PA-C      . [START ON 07/07/2019] loratadine (CLARITIN) tablet 10 mg  10 mg Oral Daily Joy, Shawn C, PA-C      . lurasidone (LATUDA) tablet 20 mg  20 mg Oral BID Joy, Shawn C, PA-C      . Melatonin TABS 5 mg  5 mg Oral QHS Joy, Shawn C, PA-C      . metoprolol tartrate (LOPRESSOR) tablet 25 mg  25 mg Oral BID Joy, Shawn C, PA-C      . nicotine (NICODERM CQ - dosed in mg/24 hr) patch 7 mg  7 mg Transdermal Daily Joy, Shawn C, PA-C      . [START ON 07/07/2019] NIFEdipine (PROCARDIA-XL/NIFEDICAL-XL) 24 hr tablet 90 mg  90 mg Oral Daily Joy, Shawn C, PA-C      . OLANZapine (ZYPREXA) tablet 5 mg  5 mg Oral BID Joy, Shawn C, PA-C      . pantoprazole (PROTONIX) EC tablet 80 mg  80 mg Oral Daily Joy, Shawn C, PA-C      . [START ON 07/07/2019] sodium chloride tablet 1 g  1 g Oral TID WC Joy, Shawn C, PA-C      . valproic acid (DEPAKENE) 250 MG capsule 500 mg  500 mg Oral BID Joy, Shawn C, PA-C       Current Outpatient Medications  Medication Sig Dispense Refill  . acetaminophen (TYLENOL) 325 MG tablet Take 325 mg by mouth every 6 (six) hours as needed for fever. Not to exceed 2000MG     . acetaminophen (TYLENOL) 500 MG tablet Take 1,000 mg by mouth every 8 (eight) hours as needed for mild pain or moderate pain. Not to exceed 3g/24h of tylenol from  all sources.    Marland Kitchen albuterol (PROVENTIL) (2.5 MG/3ML) 0.083% nebulizer solution Take 3 mLs (2.5 mg total) by nebulization every 4 (four) hours as needed for wheezing or shortness of breath.  75 mL 12  . Alum & Mag Hydroxide-Simeth (ANTACID LIQUID PO) Take 30 mLs by mouth every 6 (six) hours as needed (heartburn).    . Amino Acids-Protein Hydrolys (FEEDING SUPPLEMENT, PRO-STAT SUGAR FREE 64,) LIQD Take 30 mLs by mouth 2 (two) times daily. 887 mL 0  . aspirin 81 MG chewable tablet Chew 81 mg by mouth daily.    . benzonatate (TESSALON) 100 MG capsule Take 100 mg by mouth every 8 (eight) hours as needed for cough.    Marland Kitchen buPROPion (WELLBUTRIN XL) 150 MG 24 hr tablet Take 150 mg by mouth daily.    . Calcium Carb-Cholecalciferol (CALCIUM 600+D) 600-800 MG-UNIT TABS Take 1 tablet by mouth daily.    . Calcium Carbonate 500 MG CHEW Chew 500 mg by mouth every 12 (twelve) hours as needed (indigestion).    . citalopram (CELEXA) 20 MG tablet Take 1 tablet (20 mg total) by mouth at bedtime.    . clonazePAM (KLONOPIN) 0.5 MG tablet Take 0.5 mg by mouth 2 (two) times daily as needed for anxiety.    . clonazePAM (KLONOPIN) 1 MG tablet Take 1 tablet (1 mg total) by mouth 2 (two) times daily. 10 tablet 0  . demeclocycline (DECLOMYCIN) 150 MG tablet Take 150 mg by mouth 2 (two) times daily.    Marland Kitchen docusate sodium (COLACE) 100 MG capsule Take 100 mg by mouth See admin instructions. Take one capsule (100 mg) by mouth every 12 hours, may also take one capsule (100 mg) twice daily as needed for constipation    . feeding supplement, ENSURE ENLIVE, (ENSURE ENLIVE) LIQD Take 237 mLs by mouth daily. 237 mL 12  . fluticasone (FLONASE) 50 MCG/ACT nasal spray Place 2 sprays into both nostrils 2 (two) times daily.    . fluticasone furoate-vilanterol (BREO ELLIPTA) 100-25 MCG/INH AEPB Inhale 1 puff into the lungs daily.    . furosemide (LASIX) 20 MG tablet Take 20 mg by mouth.    . gabapentin (NEURONTIN) 100 MG capsule Take 1 capsule  (100 mg total) by mouth 2 (two) times daily.    Marland Kitchen guaiFENesin (MUCINEX) 600 MG 12 hr tablet Take 1,200 mg by mouth daily.    Marland Kitchen guaiFENesin (ROBITUSSIN) 100 MG/5ML liquid Take 200 mg by mouth every 8 (eight) hours as needed for cough.    . hydrOXYzine (VISTARIL) 50 MG capsule Take 50 mg by mouth 3 (three) times daily.    Marland Kitchen ipratropium-albuterol (DUONEB) 0.5-2.5 (3) MG/3ML SOLN Take 3 mLs by nebulization 3 (three) times daily.    Marland Kitchen lisinopril (ZESTRIL) 40 MG tablet Take 1 tablet (40 mg total) by mouth daily. 30 tablet 0  . loperamide (IMODIUM A-D) 2 MG tablet Take 2 mg by mouth every 6 (six) hours as needed for diarrhea or loose stools.    Marland Kitchen loratadine (CLARITIN) 10 MG tablet Take 10 mg by mouth daily.    Marland Kitchen lurasidone (LATUDA) 20 MG TABS tablet Take 1 tablet (20 mg total) by mouth 2 (two) times daily.    . Melatonin 5 MG TABS Take 5 mg by mouth at bedtime.    . methocarbamol (ROBAXIN) 500 MG tablet Take 500 mg by mouth daily.    . metoprolol tartrate (LOPRESSOR) 25 MG tablet Take 1 tablet (25 mg total) by mouth 2 (two) times daily. 60 tablet 0  . Multiple Vitamin (MULTIVITAMIN WITH MINERALS) TABS tablet Take 1 tablet by mouth daily.    Marland Kitchen neomycin-bacitracin-polymyxin (NEOSPORIN) ophthalmic ointment 1 application as needed (minor skin tears or abrasions).    Marland Kitchen  nicotine (NICODERM CQ - DOSED IN MG/24 HR) 7 mg/24hr patch Place 7 mg onto the skin daily.    Marland Kitchen NIFEdipine (PROCARDIA XL/ADALAT-CC) 90 MG 24 hr tablet Take 90 mg by mouth daily.    Marland Kitchen nystatin (NYSTATIN) powder Apply 1 application topically 2 (two) times daily as needed (irritation). Apply topically to reddened area twice daily as needed for irritation    . OLANZapine (ZYPREXA) 5 MG tablet Take 1 tablet (5 mg total) by mouth 2 (two) times daily. (Patient taking differently: Take 5 mg by mouth daily as needed (agitation). )    . OLANZapine (ZYPREXA) 5 MG tablet Take 5 mg by mouth 2 (two) times daily.     Marland Kitchen omeprazole (PRILOSEC) 40 MG capsule Take  1 capsule (40 mg total) by mouth at bedtime.    . ondansetron (ZOFRAN) 8 MG tablet Take 8 mg by mouth every 6 (six) hours as needed for nausea or vomiting.    Marland Kitchen oxyCODONE (OXY IR/ROXICODONE) 5 MG immediate release tablet Take 5 mg by mouth 2 (two) times daily as needed for severe pain.    . polyethylene glycol (MIRALAX) 17 g packet Take 17 g by mouth daily. (Patient taking differently: Take 17 g by mouth daily. Mix 1 capful (17 g) in 4-8 oz of water or juice and give by mouth once daily for bowel regimen) 14 each 0  . sodium chloride (OCEAN) 0.65 % SOLN nasal spray Place 2 sprays into both nostrils every 2 (two) hours as needed (dryness, irritation).    . sodium chloride 1 g tablet Take 1 tablet (1 g total) by mouth 3 (three) times daily with meals. (Patient taking differently: Take 1 g by mouth 4 (four) times daily. ) 30 tablet 0  . valproic acid (DEPAKENE) 250 MG capsule Take 2 capsules (500 mg total) by mouth 2 (two) times daily. (Patient taking differently: Take 500 mg by mouth 3 (three) times daily. ) 120 capsule 0  . Zinc Gluconate 100 MG TABS Take 100 mg by mouth daily.       Discharge Medications: Please see discharge summary for a list of discharge medications.  Relevant Imaging Results:  Relevant Lab Results:   Additional Information [id number]  Alphonse Guild Stefhanie Kachmar, LCSW

## 2019-07-06 NOTE — ED Triage Notes (Signed)
Per EMS- Patient was discharged from [facility]. Patient was rejected from [facility] when [facility] arrived with the patient. Facility stated that the patient was not accompanied by an FL2 form and needed a facility that could provide more skilled care than what they could.

## 2019-07-06 NOTE — Discharge Instructions (Addendum)
It was our pleasure to provide your ER care today - we hope that you feel better.  Transport patient to [facility].  Previous discharge instructions/plan per earlier hospital discharge.

## 2019-07-06 NOTE — Progress Notes (Signed)
CSW spoke to there [facility] Supervisor who provided permission for the CSW to send the pt's needed referral info to the admissions director via [facility] email and CSW sent it along with the PT notes recommending ALF w/no PT follow up.  CSW had FL-2 signed by the EDP and sent the info to the Admissions Director and called her by phone. The Director Miss Behrendt in admissions intially argued she was in bed and was reminded by the CSW that the [facility] ED and hospital is open 24 hours a day and as such pt has not been readmitted, is discharged and needs to go back ASAP.    The Director complained about a lack of an FL-2 and CSW stated that this would be remedied in the future and that CSW understood the Director's difficulties but stated the director now has the info needed to make a decision and requested that the director review the FL-2 with staff as pt needs to return tonight for the pt's safety and best care.   CSW awaiting return call from the Director.  CSW will continue to follow for D/C needs.  Natalie Guild. Tailor Lucking, LCSW, LCAS, CSI Transitions of Care Clinical Social Worker Care Coordination Department Ph: [phone]

## 2019-07-06 NOTE — Progress Notes (Signed)
CSW received a call from Engineer, materials at [facility] who stated oxygen must be either 1, 2, or less than .5 as she cannot provide .5 and oxygen must say either PRN or continuous and to emphasize to the pt that upon return the pt must remain in her room for two weeks for quarantine and then must know that since she is returning late she cannot smoke upon her return tonight.  CSW called EDP who stated that pt will be appropriate for 2 liters nasal cannula continuous.  RN updated.  CSW will continue to follow for D/C needs.  Alphonse Guild. Simon Llamas, LCSW, LCAS, CSI Transitions of Care Clinical Social Worker Care Coordination Department Ph: [phone]

## 2019-07-06 NOTE — ED Notes (Signed)
Received Natalie Thomas this PM at the change of shift in her room. Her personalneeds were addressed. She was given fluids to drink and medicated per order and her request with PRN medications. Her pur wick is draining urine and oxygen is infusing at 2 liters. Later report was called to the nurse at [facility] and Corey Harold was called to transport. She was transport without incident at 2319 hrs.

## 2019-07-06 NOTE — ED Notes (Signed)
Two hospital bags of belongings placed in locker 28

## 2019-07-06 NOTE — Progress Notes (Signed)
PTAR arrived to pick up patient for transport. I attempted to call report to [facility] and was put on hold >45min, then was told someone would call me right back.

## 2019-07-06 NOTE — ED Notes (Signed)
Gave pt a coke and cheese.  In bed resting. Alert.

## 2019-07-06 NOTE — ED Notes (Signed)
Received call from director of [facility] stating a pt was sent to them without and FL2 form. Upon researching, pt was discharge from [place], Referred her to call [place], number provided

## 2019-07-06 NOTE — Progress Notes (Signed)
CSW reviewed chart and sees no FL-2 form created thus far and also saw that per the CSW's note on 1/10 pt was ready for D/C, the facility was informed and the admissions director had states, "Lavona Mound who explains that there is no Systems analyst to oversee admission and that patient could not be transferred until Monday" (see below).  Clinical Narrative from CSW's note on 1/10:    CSW in contact with [facility] to inform staff that patient is deemed stable and medically ready for discharge. Staff on the phone is agreeable and instructs CSW to arrange transportation.  CSW later receives from call from Lavona Mound who explains that there is no Systems analyst to oversee admission and that patient could not be transferred until Monday. Cornell Barman goes into detail and instructs CSW to fax over progress notes and dc summary for staff to review on Monday.   TOC team will continue following patient for discharge related needs.  *End of Note from 1/10*  CSW will continue to follow for D/C needs.  Alphonse Guild. Anayelli Lai, LCSW, LCAS, CSI Transitions of Care Clinical Social Worker Care Coordination Department Ph: [phone]            :

## 2019-07-06 NOTE — NC FL2 (Deleted)
[place] MEDICAID FL2 LEVEL OF CARE SCREENING TOOL     IDENTIFICATION  Patient Name: Natalie Thomas Birthdate: 08-10-1960 Sex: female Admission Date (Current Location): 07/06/2019  [facility] and [place] Number:  Herbalist and Address:  [facility],  [place] [address] [place]      Provider Number: [id number]  Attending Physician Name and Address:  Lajean Saver, MD  Relative Name and Phone Number:       Current Level of Care: Hospital(ALF) Recommended Level of Care: Assisted Living Facility Prior Approval Number:    Date Approved/Denied:   PASRR Number:    Discharge Plan: (ALF)    Current Diagnoses: Patient Active Problem List   Diagnosis Date Noted  . Hypertensive crisis 05/01/2019  . Abdominal pain 03/15/2019  . Pleural effusion 03/13/2019  . Atelectasis 03/13/2019  . Chronic diastolic CHF (congestive heart failure) ([place]) 03/13/2019  . Type 2 diabetes mellitus without complication ([place]) [id number]  . Urinary tract bacterial infections 03/09/2019  . Acute cystitis without hematuria   . Constipation   . Acute hypoxemic respiratory failure ([place]) 12/03/2018  . Irritability 11/09/2018  . Aggressive behavior 11/09/2018  . Agitation   . S/P right hip fracture 11/07/2017  . Closed comminuted intertrochanteric fracture of right femur ([place]) 11/07/2017  . Anxiety and depression   . Dyspnea 10/16/2017  . Anemia 10/16/2017  . COPD exacerbation ([place]) 10/07/2017  . Hyperlipidemia 10/07/2017  . Acute on chronic respiratory failure with hypoxia and hypercapnia ([place]) 10/07/2017  . Incontinence of urine 08/14/2011  . Schizoaffective disorder, bipolar type ([place]) 07/05/2011  . Cough 05/08/2011  . Lumbar disc disease 04/09/2011  . Polycythemia secondary to smoking 04/09/2011  . HIP PAIN, LEFT 12/12/2007  . 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 DM (diabetes mellitus), type 2 ([place]) 04/08/2007  . MENOPAUSE, PREMATURE 04/08/2007  . Chronic  hyponatremia 04/08/2007  . Essential hypertension 04/08/2007  . COPD (chronic obstructive pulmonary disease) ([place]) 04/08/2007  . GERD 04/08/2007  . Osteoporosis 04/08/2007    Orientation RESPIRATION BLADDER Height & Weight     Self, Time, Situation, Place  O2(.5 liters nasal cannula) Continent(Request a purewick for convenience, per the pt.) Weight: 191 lb 12.8 oz (87 kg) Height:  5\' 5"  (165.1 cm)  BEHAVIORAL SYMPTOMS/MOOD NEUROLOGICAL BOWEL NUTRITION STATUS      Continent Diet(Regular)  AMBULATORY STATUS COMMUNICATION OF NEEDS Skin   Independent(Walks with a walker at times) Verbally Normal                       Personal Care Assistance Level of Assistance  Bathing, Dressing Bathing Assistance: Limited assistance   Dressing Assistance: Limited assistance     Functional Limitations Info             SPECIAL CARE FACTORS FREQUENCY                       Contractures Contractures Info: Not present    Additional Factors Info  Code Status Code Status Info: FULL CODE Allergies Info: Clarithromycin, Penicillins           Current Medications (07/06/2019):  This is the current hospital active medication list Current Facility-Administered Medications  Medication Dose Route Frequency Provider Last Rate Last Admin  . aspirin chewable tablet 81 mg  81 mg Oral Daily Joy, Shawn C, PA-C      . benzonatate (TESSALON) capsule 100 mg  100 mg Oral Q8H PRN Joy, Shawn C, PA-C      . [  START ON 07/07/2019] buPROPion (WELLBUTRIN XL) 24 hr tablet 150 mg  150 mg Oral Daily Joy, Shawn C, PA-C      . citalopram (CELEXA) tablet 20 mg  20 mg Oral QHS Joy, Shawn C, PA-C      . clonazePAM (KLONOPIN) tablet 0.5 mg  0.5 mg Oral BID PRN Joy, Shawn C, PA-C      . demeclocycline (DECLOMYCIN) tablet 150 mg  150 mg Oral BID Joy, Shawn C, PA-C      . docusate sodium (COLACE) capsule 100 mg  100 mg Oral See admin instructions Joy, Shawn C, PA-C      . fluticasone (FLONASE) 50 MCG/ACT nasal  spray 2 spray  2 spray Each Nare BID Joy, Shawn C, PA-C      . [START ON 07/07/2019] fluticasone furoate-vilanterol (BREO ELLIPTA) 100-25 MCG/INH 1 puff  1 puff Inhalation Daily Lenis Noon, RPH      . [START ON 07/07/2019] furosemide (LASIX) tablet 20 mg  20 mg Oral Daily Joy, Shawn C, PA-C      . gabapentin (NEURONTIN) capsule 100 mg  100 mg Oral BID Joy, Shawn C, PA-C      . [START ON 07/07/2019] guaiFENesin (MUCINEX) 12 hr tablet 1,200 mg  1,200 mg Oral Daily Joy, Shawn C, PA-C      . hydrOXYzine (ATARAX/VISTARIL) tablet 50 mg  50 mg Oral TID Joy, Shawn C, PA-C      . [START ON 07/07/2019] lisinopril (ZESTRIL) tablet 40 mg  40 mg Oral Daily Joy, Shawn C, PA-C      . [START ON 07/07/2019] loratadine (CLARITIN) tablet 10 mg  10 mg Oral Daily Joy, Shawn C, PA-C      . lurasidone (LATUDA) tablet 20 mg  20 mg Oral BID Joy, Shawn C, PA-C      . Melatonin TABS 5 mg  5 mg Oral QHS Joy, Shawn C, PA-C      . metoprolol tartrate (LOPRESSOR) tablet 25 mg  25 mg Oral BID Joy, Shawn C, PA-C      . nicotine (NICODERM CQ - dosed in mg/24 hr) patch 7 mg  7 mg Transdermal Daily Joy, Shawn C, PA-C      . [START ON 07/07/2019] NIFEdipine (PROCARDIA-XL/NIFEDICAL-XL) 24 hr tablet 90 mg  90 mg Oral Daily Joy, Shawn C, PA-C      . OLANZapine (ZYPREXA) tablet 5 mg  5 mg Oral BID Joy, Shawn C, PA-C      . pantoprazole (PROTONIX) EC tablet 80 mg  80 mg Oral Daily Joy, Shawn C, PA-C      . [START ON 07/07/2019] sodium chloride tablet 1 g  1 g Oral TID WC Joy, Shawn C, PA-C      . valproic acid (DEPAKENE) 250 MG capsule 500 mg  500 mg Oral BID Joy, Shawn C, PA-C       Current Outpatient Medications  Medication Sig Dispense Refill  . acetaminophen (TYLENOL) 325 MG tablet Take 325 mg by mouth every 6 (six) hours as needed for fever. Not to exceed 2000MG     . acetaminophen (TYLENOL) 500 MG tablet Take 1,000 mg by mouth every 8 (eight) hours as needed for mild pain or moderate pain. Not to exceed 3g/24h of tylenol from all  sources.    Marland Kitchen albuterol (PROVENTIL) (2.5 MG/3ML) 0.083% nebulizer solution Take 3 mLs (2.5 mg total) by nebulization every 4 (four) hours as needed for wheezing or shortness of breath. 75 mL 12  . Alum &  Mag Hydroxide-Simeth (ANTACID LIQUID PO) Take 30 mLs by mouth every 6 (six) hours as needed (heartburn).    . Amino Acids-Protein Hydrolys (FEEDING SUPPLEMENT, PRO-STAT SUGAR FREE 64,) LIQD Take 30 mLs by mouth 2 (two) times daily. 887 mL 0  . aspirin 81 MG chewable tablet Chew 81 mg by mouth daily.    . benzonatate (TESSALON) 100 MG capsule Take 100 mg by mouth every 8 (eight) hours as needed for cough.    Marland Kitchen buPROPion (WELLBUTRIN XL) 150 MG 24 hr tablet Take 150 mg by mouth daily.    . Calcium Carb-Cholecalciferol (CALCIUM 600+D) 600-800 MG-UNIT TABS Take 1 tablet by mouth daily.    . Calcium Carbonate 500 MG CHEW Chew 500 mg by mouth every 12 (twelve) hours as needed (indigestion).    . citalopram (CELEXA) 20 MG tablet Take 1 tablet (20 mg total) by mouth at bedtime.    . clonazePAM (KLONOPIN) 0.5 MG tablet Take 0.5 mg by mouth 2 (two) times daily as needed for anxiety.    . clonazePAM (KLONOPIN) 1 MG tablet Take 1 tablet (1 mg total) by mouth 2 (two) times daily. 10 tablet 0  . demeclocycline (DECLOMYCIN) 150 MG tablet Take 150 mg by mouth 2 (two) times daily.    Marland Kitchen docusate sodium (COLACE) 100 MG capsule Take 100 mg by mouth See admin instructions. Take one capsule (100 mg) by mouth every 12 hours, may also take one capsule (100 mg) twice daily as needed for constipation    . feeding supplement, ENSURE ENLIVE, (ENSURE ENLIVE) LIQD Take 237 mLs by mouth daily. 237 mL 12  . fluticasone (FLONASE) 50 MCG/ACT nasal spray Place 2 sprays into both nostrils 2 (two) times daily.    . fluticasone furoate-vilanterol (BREO ELLIPTA) 100-25 MCG/INH AEPB Inhale 1 puff into the lungs daily.    . furosemide (LASIX) 20 MG tablet Take 20 mg by mouth.    . gabapentin (NEURONTIN) 100 MG capsule Take 1 capsule (100  mg total) by mouth 2 (two) times daily.    Marland Kitchen guaiFENesin (MUCINEX) 600 MG 12 hr tablet Take 1,200 mg by mouth daily.    Marland Kitchen guaiFENesin (ROBITUSSIN) 100 MG/5ML liquid Take 200 mg by mouth every 8 (eight) hours as needed for cough.    . hydrOXYzine (VISTARIL) 50 MG capsule Take 50 mg by mouth 3 (three) times daily.    Marland Kitchen ipratropium-albuterol (DUONEB) 0.5-2.5 (3) MG/3ML SOLN Take 3 mLs by nebulization 3 (three) times daily.    Marland Kitchen lisinopril (ZESTRIL) 40 MG tablet Take 1 tablet (40 mg total) by mouth daily. 30 tablet 0  . loperamide (IMODIUM A-D) 2 MG tablet Take 2 mg by mouth every 6 (six) hours as needed for diarrhea or loose stools.    Marland Kitchen loratadine (CLARITIN) 10 MG tablet Take 10 mg by mouth daily.    Marland Kitchen lurasidone (LATUDA) 20 MG TABS tablet Take 1 tablet (20 mg total) by mouth 2 (two) times daily.    . Melatonin 5 MG TABS Take 5 mg by mouth at bedtime.    . methocarbamol (ROBAXIN) 500 MG tablet Take 500 mg by mouth daily.    . metoprolol tartrate (LOPRESSOR) 25 MG tablet Take 1 tablet (25 mg total) by mouth 2 (two) times daily. 60 tablet 0  . Multiple Vitamin (MULTIVITAMIN WITH MINERALS) TABS tablet Take 1 tablet by mouth daily.    Marland Kitchen neomycin-bacitracin-polymyxin (NEOSPORIN) ophthalmic ointment 1 application as needed (minor skin tears or abrasions).    . nicotine (NICODERM CQ -  DOSED IN MG/24 HR) 7 mg/24hr patch Place 7 mg onto the skin daily.    Marland Kitchen NIFEdipine (PROCARDIA XL/ADALAT-CC) 90 MG 24 hr tablet Take 90 mg by mouth daily.    Marland Kitchen nystatin (NYSTATIN) powder Apply 1 application topically 2 (two) times daily as needed (irritation). Apply topically to reddened area twice daily as needed for irritation    . OLANZapine (ZYPREXA) 5 MG tablet Take 1 tablet (5 mg total) by mouth 2 (two) times daily. (Patient taking differently: Take 5 mg by mouth daily as needed (agitation). )    . OLANZapine (ZYPREXA) 5 MG tablet Take 5 mg by mouth 2 (two) times daily.     Marland Kitchen omeprazole (PRILOSEC) 40 MG capsule Take 1  capsule (40 mg total) by mouth at bedtime.    . ondansetron (ZOFRAN) 8 MG tablet Take 8 mg by mouth every 6 (six) hours as needed for nausea or vomiting.    Marland Kitchen oxyCODONE (OXY IR/ROXICODONE) 5 MG immediate release tablet Take 5 mg by mouth 2 (two) times daily as needed for severe pain.    . polyethylene glycol (MIRALAX) 17 g packet Take 17 g by mouth daily. (Patient taking differently: Take 17 g by mouth daily. Mix 1 capful (17 g) in 4-8 oz of water or juice and give by mouth once daily for bowel regimen) 14 each 0  . sodium chloride (OCEAN) 0.65 % SOLN nasal spray Place 2 sprays into both nostrils every 2 (two) hours as needed (dryness, irritation).    . sodium chloride 1 g tablet Take 1 tablet (1 g total) by mouth 3 (three) times daily with meals. (Patient taking differently: Take 1 g by mouth 4 (four) times daily. ) 30 tablet 0  . valproic acid (DEPAKENE) 250 MG capsule Take 2 capsules (500 mg total) by mouth 2 (two) times daily. (Patient taking differently: Take 500 mg by mouth 3 (three) times daily. ) 120 capsule 0  . Zinc Gluconate 100 MG TABS Take 100 mg by mouth daily.       Discharge Medications: Please see discharge summary for a list of discharge medications.  Relevant Imaging Results:  Relevant Lab Results:   Additional Information [id number]  Alphonse Guild Alvenia Treese, LCSW

## 2019-07-06 NOTE — TOC Progression Note (Addendum)
Transition of Care [facility]) - Progression Note    Patient Details  Name: Natalie Thomas MRN: [id number] Date of Birth: 1960-07-06  Transition of Care [facility]) CM/SW Contact  Leeroy Cha, RN Phone Number: 07/06/2019, 10:34 AM  Clinical Narrative:    tct [place] call bACK AND CHOSE OPTION 3 TCT-hOLDEN hEIGHTS-mARY OKAY FOR PATIENT TO RETURN CALL  [phone] for rekport.  Given to floor nurse. PTAR called at [place]  Expected Discharge Plan: Assisted Living Barriers to Discharge: Continued Medical Work up  Expected Discharge Plan and Services Expected Discharge Plan: Assisted Living In-house Referral: Clinical Social Work       Expected Discharge Date: 07/05/19                         [facility] Arranged: NA           Social Determinants of Health (SDOH) Interventions    Readmission Risk Interventions Readmission Risk Prevention Plan 07/01/2019 03/10/2019 12/08/2018  Transportation Screening Complete Complete Complete  Medication Review Press photographer) Referral to Pharmacy Complete Complete  PCP or Specialist appointment within 3-5 days of discharge Complete Complete Complete  HRI or [place] Not Complete Complete Complete  [place] or Home Care Consult Pt Refusal Comments No orders yet, patient is from [facility]. - -  SW Recovery Care/Counseling Consult Complete Complete Complete  Palliative Care Screening Not Applicable Not Applicable Not Applicable  Skilled Nursing Facility Not Applicable Complete Complete  Some recent data might be hidden

## 2019-07-06 NOTE — ED Provider Notes (Signed)
[facility] DEPT Provider Note   CSN: [id number] Arrival date & time: 07/06/19  1340     History Chief Complaint  Patient presents with  . refused from nursing home    Natalie Thomas is a 59 y.o. female.  HPI      Natalie Thomas is a 59 y.o. female, with a history of COPD, CHF, anxiety, DM, HTN, GERD, hyperlipidemia, schizoaffective, presenting to the ED from [facility].  Patient was discharged from the hospital today back to this SNF.  The staff there reportedly reject the patient's return because she did not have a FL-2 Form. Patient denies any acute complaints.      Past Medical History:  Diagnosis Date  . Anemia 10/16/2017  . Anxiety and depression   . CHF (congestive heart failure) ([place])   . Chronic hyponatremia 04/08/2007   Qualifier: Diagnosis of  By: Marca Ancona RMA, Lucy    . Closed comminuted intertrochanteric fracture of right femur ([place]) 11/07/2017  . COPD 04/08/2007   Qualifier: Diagnosis of  By: Marca Ancona RMA, Lucy    . Depression   . Diabetes mellitus, type 2 ([place])    pt denies but states that she has been treated for DM  . Essential hypertension 04/08/2007   Qualifier: Diagnosis of  By: Marca Ancona RMA, Lucy    . GERD (gastroesophageal reflux disease)   . Hyperlipidemia 10/07/2017  . Lumbar disc disease 04/09/2011  . MENOPAUSE, PREMATURE 04/08/2007   Qualifier: Diagnosis of  By: Marca Ancona RMA, Lucy    . 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 Osteoporosis 04/08/2007   Qualifier: Diagnosis of  By: Reatha Armour, Lucy    . Polycythemia secondary to smoking 04/09/2011  . Schizoaffective disorder, bipolar type ([place]) 07/05/2011    Patient Active Problem List   Diagnosis Date Noted  . Hypertensive crisis 05/01/2019  . Abdominal pain 03/15/2019  . Pleural effusion 03/13/2019  . Atelectasis 03/13/2019  . Chronic diastolic CHF (congestive heart failure) ([place]) 03/13/2019  . Type 2  diabetes mellitus without complication ([place]) [id number]  . Urinary tract bacterial infections 03/09/2019  . Acute cystitis without hematuria   . Constipation   . Acute hypoxemic respiratory failure ([place]) 12/03/2018  . Irritability 11/09/2018  . Aggressive behavior 11/09/2018  . Agitation   . S/P right hip fracture 11/07/2017  . Closed comminuted intertrochanteric fracture of right femur ([place]) 11/07/2017  . Anxiety and depression   . Dyspnea 10/16/2017  . Anemia 10/16/2017  . COPD exacerbation ([place]) 10/07/2017  . Hyperlipidemia 10/07/2017  . Acute on chronic respiratory failure with hypoxia and hypercapnia ([place]) 10/07/2017  . Incontinence of urine 08/14/2011  . Schizoaffective disorder, bipolar type ([place]) 07/05/2011  . Cough 05/08/2011  . Lumbar disc disease 04/09/2011  . Polycythemia secondary to smoking 04/09/2011  . HIP PAIN, LEFT 12/12/2007  . 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 DM (diabetes mellitus), type 2 ([place]) 04/08/2007  . MENOPAUSE, PREMATURE 04/08/2007  . Chronic hyponatremia 04/08/2007  . Essential hypertension 04/08/2007  . COPD (chronic obstructive pulmonary disease) ([place]) 04/08/2007  . GERD 04/08/2007  . Osteoporosis 04/08/2007    Past Surgical History:  Procedure Laterality Date  . EYE SURGERY     on left  eye, pt states that she sees bad out of the right eye and needs surgery there  . INTRAMEDULLARY (IM) NAIL INTERTROCHANTERIC Right 11/13/2017   Procedure: INTRAMEDULLARY (IM) NAIL INTERTROCHANTRIC;  Surgeon: Shona Needles,  MD;  Location: [facility];  Service: Orthopedics;  Laterality: Right;  . PELVIC FRACTURE SURGERY       OB History   No obstetric history on file.     History reviewed. No pertinent family history.  Social History   Tobacco Use  . Smoking status: Current Every Day Smoker    Packs/day: 0.25    Types: Cigarettes  . Smokeless tobacco: Never Used  Substance Use Topics  . Alcohol use: No  . Drug use: No    Home  Medications Prior to Admission medications   Medication Sig Start Date End Date Taking? Authorizing Provider  acetaminophen (TYLENOL) 325 MG tablet Take 325 mg by mouth every 6 (six) hours as needed for fever. Not to exceed 2000MG    Yes [provider]  acetaminophen (TYLENOL) 500 MG tablet Take 1,000 mg by mouth every 8 (eight) hours as needed for mild pain or moderate pain. Not to exceed 3g/24h of tylenol from all sources.   Yes [provider]  albuterol (PROVENTIL) (2.5 MG/3ML) 0.083% nebulizer solution Take 3 mLs (2.5 mg total) by nebulization every 4 (four) hours as needed for wheezing or shortness of breath. 12/11/18  Yes Swayze, Ava, DO  Alum & Mag Hydroxide-Simeth (ANTACID LIQUID PO) Take 30 mLs by mouth every 6 (six) hours as needed (heartburn).   Yes [provider]  Amino Acids-Protein Hydrolys (FEEDING SUPPLEMENT, PRO-STAT SUGAR FREE 64,) LIQD Take 30 mLs by mouth 2 (two) times daily. 03/14/19  Yes Oretha Milch D, MD  aspirin 81 MG chewable tablet Chew 81 mg by mouth daily.   Yes [provider]  benzonatate (TESSALON) 100 MG capsule Take 100 mg by mouth every 8 (eight) hours as needed for cough.   Yes [provider]  buPROPion (WELLBUTRIN XL) 150 MG 24 hr tablet Take 150 mg by mouth daily.   Yes [provider]  Calcium Carb-Cholecalciferol (CALCIUM 600+D) 600-800 MG-UNIT TABS Take 1 tablet by mouth daily.   Yes [provider]  Calcium Carbonate 500 MG CHEW Chew 500 mg by mouth every 12 (twelve) hours as needed (indigestion).   Yes [provider]  citalopram (CELEXA) 20 MG tablet Take 1 tablet (20 mg total) by mouth at bedtime. 05/15/19  Yes Hongalgi, Lenis Dickinson, MD  clonazePAM (KLONOPIN) 0.5 MG tablet Take 0.5 mg by mouth 2 (two) times daily as needed for anxiety.   Yes [provider]  clonazePAM (KLONOPIN) 1 MG tablet Take 1 tablet (1 mg total) by mouth 2 (two) times daily. 05/15/19  Yes Hongalgi, Lenis Dickinson, MD  demeclocycline (DECLOMYCIN) 150 MG tablet Take 150 mg by mouth 2 (two) times daily.   Yes [provider]  docusate sodium (COLACE) 100 MG capsule Take 100 mg by mouth See admin instructions. Take one capsule (100 mg) by mouth every 12 hours, may also take one capsule (100 mg) twice daily as needed for constipation   Yes [provider]  feeding supplement, ENSURE ENLIVE, (ENSURE ENLIVE) LIQD Take 237 mLs by mouth daily. 03/14/19  Yes Desiree Hane, MD  fluticasone (FLONASE) 50 MCG/ACT nasal spray Place 2 sprays into both nostrils 2 (two) times daily.   Yes [provider]  fluticasone furoate-vilanterol (BREO ELLIPTA) 100-25 MCG/INH AEPB Inhale 1 puff into the lungs daily.   Yes [provider]  furosemide (LASIX) 20 MG tablet Take 20 mg by mouth.   Yes [provider]  gabapentin (NEURONTIN) 100 MG capsule Take 1 capsule (100  mg total) by mouth 2 (two) times daily. 05/15/19  Yes Hongalgi, Lenis Dickinson, MD  guaiFENesin (MUCINEX) 600 MG 12 hr tablet Take 1,200 mg by mouth daily.   Yes [provider]  guaiFENesin (ROBITUSSIN) 100 MG/5ML liquid Take 200 mg by mouth every 8 (eight) hours as needed for cough.   Yes [provider]  hydrOXYzine (VISTARIL) 50 MG capsule Take 50 mg by mouth 3 (three) times daily.   Yes [provider]  ipratropium-albuterol (DUONEB) 0.5-2.5 (3) MG/3ML SOLN Take 3 mLs by nebulization 3 (three) times daily.   Yes [provider]  lisinopril (ZESTRIL) 40 MG tablet Take 1 tablet (40 mg total) by mouth daily. 04/25/19 06/29/20 Yes [place] Eric J, DO  loperamide (IMODIUM A-D) 2 MG tablet Take 2 mg by mouth every 6 (six) hours as needed for diarrhea or loose stools. 06/28/19  Yes [provider]  loratadine (CLARITIN) 10 MG tablet Take 10 mg by mouth daily.   Yes [provider]  lurasidone (LATUDA) 20 MG TABS tablet Take 1 tablet (20 mg total) by mouth 2 (two) times daily. 05/15/19   Yes Hongalgi, Lenis Dickinson, MD  Melatonin 5 MG TABS Take 5 mg by mouth at bedtime.   Yes [provider]  methocarbamol (ROBAXIN) 500 MG tablet Take 500 mg by mouth daily.   Yes [provider]  metoprolol tartrate (LOPRESSOR) 25 MG tablet Take 1 tablet (25 mg total) by mouth 2 (two) times daily. 12/17/18  Yes Kayleen Memos, DO  Multiple Vitamin (MULTIVITAMIN WITH MINERALS) TABS tablet Take 1 tablet by mouth daily. 03/15/19  Yes Oretha Milch D, MD  neomycin-bacitracin-polymyxin (NEOSPORIN) ophthalmic ointment 1 application as needed (minor skin tears or abrasions).   Yes [provider]  nicotine (NICODERM CQ - DOSED IN MG/24 HR) 7 mg/24hr patch Place 7 mg onto the skin daily.   Yes [provider]  NIFEdipine (PROCARDIA XL/ADALAT-CC) 90 MG 24 hr tablet Take 90 mg by mouth daily.   Yes [provider]  nystatin (NYSTATIN) powder Apply 1 application topically 2 (two) times daily as needed (irritation). Apply topically to reddened area twice daily as needed for irritation   Yes [provider]  OLANZapine (ZYPREXA) 5 MG tablet Take 1 tablet (5 mg total) by mouth 2 (two) times daily. Patient taking differently: Take 5 mg by mouth daily as needed (agitation).  05/15/19  Yes Hongalgi, Lenis Dickinson, MD  OLANZapine (ZYPREXA) 5 MG tablet Take 5 mg by mouth 2 (two) times daily.    Yes [provider]  omeprazole (PRILOSEC) 40 MG capsule Take 1 capsule (40 mg total) by mouth at bedtime. 05/15/19  Yes Hongalgi, Lenis Dickinson, MD  ondansetron (ZOFRAN) 8 MG tablet Take 8 mg by mouth every 6 (six) hours as needed for nausea or vomiting.   Yes [provider]  oxyCODONE (OXY IR/ROXICODONE) 5 MG immediate release tablet Take 5 mg by mouth 2 (two) times daily as needed for severe pain.   Yes [provider]  polyethylene glycol (MIRALAX) 17 g packet Take 17 g by mouth daily. Patient taking differently: Take 17 g by mouth daily. Mix 1 capful (17 g) in  4-8 oz of water or juice and give by mouth once daily for bowel regimen 12/11/18  Yes Swayze, Ava, DO  sodium chloride (OCEAN) 0.65 % SOLN nasal spray Place 2 sprays into both nostrils every 2 (two) hours as needed (dryness, irritation).   Yes [provider]  sodium  chloride 1 g tablet Take 1 tablet (1 g total) by mouth 3 (three) times daily with meals. Patient taking differently: Take 1 g by mouth 4 (four) times daily.  12/17/18  Yes Kayleen Memos, DO  valproic acid (DEPAKENE) 250 MG capsule Take 2 capsules (500 mg total) by mouth 2 (two) times daily. Patient taking differently: Take 500 mg by mouth 3 (three) times daily.  12/11/18  Yes Swayze, Ava, DO  Zinc Gluconate 100 MG TABS Take 100 mg by mouth daily.   Yes [provider]    Allergies    Clarithromycin and Penicillins  Review of Systems   Review of Systems  Constitutional: Negative for chills and fever.  Respiratory: Negative for cough and shortness of breath.   Cardiovascular: Negative for chest pain.  Gastrointestinal: Negative for abdominal pain, blood in stool, diarrhea, nausea and vomiting.  Genitourinary: Negative for dysuria, frequency and hematuria.  Neurological: Negative for syncope and weakness.  All other systems reviewed and are negative.   Physical Exam Updated Vital Signs BP 93/68 (BP Location: Left Arm)   Pulse 65   Temp 99.4 F (37.4 C) (Oral)   Resp 16   Ht 5\' 5"  (1.651 m)   Wt 87 kg   SpO2 92%   BMI 31.92 kg/m   Physical Exam Vitals and nursing note reviewed.  Constitutional:      General: She is not in acute distress.    Appearance: She is well-developed. She is not diaphoretic.  HENT:     Head: Normocephalic and atraumatic.     Mouth/Throat:     Mouth: Mucous membranes are moist.     Pharynx: Oropharynx is clear.  Eyes:     Conjunctiva/sclera: Conjunctivae normal.  Cardiovascular:     Rate and Rhythm: Normal rate and regular rhythm.     Pulses: Normal pulses.           Radial pulses are 2+ on the right side and 2+ on the left side.       Posterior tibial pulses are 2+ on the right side and 2+ on the left side.     Heart sounds: Normal heart sounds.     Comments: Tactile temperature in the extremities appropriate and equal bilaterally. Pulmonary:     Effort: Pulmonary effort is normal. No respiratory distress.     Breath sounds: Normal breath sounds.     Comments: No increased work of breathing.  Speaks in full sentences without difficulty.  Patient is currently on 1 L supplemental O2.  She states she is typically on up to 2 L supplemental O2. Abdominal:     Palpations: Abdomen is soft.     Tenderness: There is no abdominal tenderness. There is no guarding.  Musculoskeletal:     Cervical back: Neck supple.     Right lower leg: No edema.     Left lower leg: No edema.  Lymphadenopathy:     Cervical: No cervical adenopathy.  Skin:    General: Skin is warm and dry.  Neurological:     Mental Status: She is alert.  Psychiatric:        Mood and Affect: Mood and affect normal.        Speech: Speech normal.        Behavior: Behavior normal.     ED Results / Procedures / Treatments   Labs (all labs ordered are listed, but only abnormal results are displayed) Labs Reviewed - No data to display  EKG None  Radiology No results found.  Procedures Procedures (including critical care time)  Medications Ordered in ED Medications  aspirin chewable tablet 81 mg (81 mg Oral Not Given 07/06/19 1855)  benzonatate (TESSALON) capsule 100 mg (has no administration in time range)  buPROPion (WELLBUTRIN XL) 24 hr tablet 150 mg (has no administration in time range)  citalopram (CELEXA) tablet 20 mg (has no administration in time range)  clonazePAM (KLONOPIN) tablet 0.5 mg (has no administration in time range)  demeclocycline (DECLOMYCIN) tablet 150 mg (has no administration in time range)  docusate sodium (COLACE) capsule 100 mg (has no administration in time  range)  fluticasone (FLONASE) 50 MCG/ACT nasal spray 2 spray (has no administration in time range)  furosemide (LASIX) tablet 20 mg (has no administration in time range)  guaiFENesin (MUCINEX) 12 hr tablet 1,200 mg (has no administration in time range)  gabapentin (NEURONTIN) capsule 100 mg (has no administration in time range)  lisinopril (ZESTRIL) tablet 40 mg (has no administration in time range)  hydrOXYzine (ATARAX/VISTARIL) tablet 50 mg (has no administration in time range)  loratadine (CLARITIN) tablet 10 mg (has no administration in time range)  Melatonin TABS 5 mg (has no administration in time range)  lurasidone (LATUDA) tablet 20 mg (has no administration in time range)  metoprolol tartrate (LOPRESSOR) tablet 25 mg (has no administration in time range)  nicotine (NICODERM CQ - dosed in mg/24 hr) patch 7 mg (7 mg Transdermal Not Given 07/06/19 1855)  NIFEdipine (PROCARDIA-XL/NIFEDICAL-XL) 24 hr tablet 90 mg (has no administration in time range)  OLANZapine (ZYPREXA) tablet 5 mg (has no administration in time range)  pantoprazole (PROTONIX) EC tablet 80 mg (80 mg Oral Not Given 07/06/19 1856)  sodium chloride tablet 1 g (has no administration in time range)  valproic acid (DEPAKENE) 250 MG capsule 500 mg (has no administration in time range)  fluticasone furoate-vilanterol (BREO ELLIPTA) 100-25 MCG/INH 1 puff (has no administration in time range)    ED Course  I have reviewed the triage vital signs and the nursing notes.  Pertinent labs & imaging results that were available during my care of the patient were reviewed by me and considered in my medical decision making (see chart for details).  Clinical Course as of Jul 05 2099  Mon Jul 06, 2019  1615 Attempted to call [facility].  Was put on hold for 10 minutes.   [SJ]  [place], Education officer, museum, states patient's facility sent patient back because she did not have a FL-2 accompanying her.  He states they did not have to send  her back to get this form.  He will work on sorting this out.   [SJ]    Clinical Course User Index [SJ] Amrita Radu, Maceo Pro   MDM Rules/Calculators/A&P                       Patient discharged from the hospital today back to her SNF.  SNF refused to allow patient to stay stating she did not have FL-2 form.  Patient was then transported back to the ED. Social work is working on sorting out the issue. Patient will board in the ED in the mean time.  Findings and plan of care discussed with Lajean Saver, MD.    Final Clinical Impression(s) / ED Diagnoses Final diagnoses:  None    Rx / DC Orders ED Discharge Orders    None       Layla Maw 07/06/19 2103  Lajean Saver, MD 07/06/19 2252

## 2019-07-06 NOTE — TOC Progression Note (Signed)
Transition of Care [facility]) - Progression Note    Patient Details  Name: Natalie Thomas MRN: [id number] Date of Birth: Nov 29, 1960  Transition of [facility] CM/SW Contact  Leeroy Cha, RN Phone Number: 07/06/2019, 11:39 AM  Clinical Narrative:    tct-legal guardian/michelle juchat/informed that patient would be returning to [facility] this am.   Expected Discharge Plan: Assisted Living Barriers to Discharge: Continued Medical Work up  Expected Discharge Plan and Services Expected Discharge Plan: Assisted Living In-house Referral: Clinical Social Work       Expected Discharge Date: 07/06/19                         [facility] Arranged: NA           Social Determinants of Health (SDOH) Interventions    Readmission Risk Interventions Readmission Risk Prevention Plan 07/01/2019 03/10/2019 12/08/2018  Transportation Screening Complete Complete Complete  Medication Review (RN Care Manager) Referral to Pharmacy Complete Complete  PCP or Specialist appointment within 3-5 days of discharge Complete Complete Complete  HRI or [place] Not Complete Complete Complete  [place] or Home Care Consult Pt Refusal Comments No orders yet, patient is from [facility]. - -  SW Recovery Care/Counseling Consult Complete Complete Complete  Palliative Care Screening Not Applicable Not Applicable Not Applicable  Skilled Nursing Facility Not Applicable Complete Complete  Some recent data might be hidden

## 2019-07-06 NOTE — Progress Notes (Addendum)
CSW updated DON Angel who voiced understanding of the changes to pt's FL-2 and approved.  CSW updated Tori in admissions who voiced understanding and started pt could return to [facility] and that the pt can arrive ASAP on 07/06/19.  The room number will be 208.  The number for report is [phone] and ask for [facility] on the second floor or the pt's RN.  If you can't reach both please call the DON Angel at ph: [phone].  CSW will update RN and EDP.  Alphonse Guild. Rasheda Ledger, Latanya Presser, LCAS Clinical Social Worker Ph: [phone]

## 2019-07-06 NOTE — Progress Notes (Signed)
CSW called and spoke to staff who stated to send pt's FL-2 over to [facility] at [phone].  CSW called Miss Magdalene Molly who stated that the "fax machine isn't working due to it being out of toner and you will have to wait until morning".  CSW stated CSW can send the FL-2 by [facility] email and CSW was told the facility "still won't be able to get it because the fax machine isn't working".  CSW stated that CSW needed clarification that no one at the facility has access to their email account and if this is so and this keeps pt from D/C'ing then the hospital protocol would require the CSW to contact DSS and report that the facility is not taking the pt back.  CSW was told by Miss Wyvonnia Dusky that she would take the CSW's number, call the Development worker, international aid and, "hopefully she will give you a call back".  CSW stated that the CSW would expect a call shortly.  CSW did not receive a call and called the Executive Director's office phone and left a message stating pt is to return with FL-2 being provided shortly.  7:31 PM CSW received a call from from the [facility] [facility] the Admissions Director stating pt was sent without an FL-2 and that the facility has officially discharged the pt due to:  1.  The patient being in the hospital for 4 days and.. 2.  Pt needing a higher level of care.  CSW then asked:  1.  Was a 30-day notice given? Miss Claris Gower stated no. 2. Were police charges files?  Miss Claris Gower stated no.  CSW then stated that unless a 30-day notice was given and/or an immediate discharge due to aggression was documented, usually by a police report being created, then the facility cannot just discharge a person without notice in the state of [place] and that the authorities are likely to back this.  Per Miss Behrendt, "We have to review the FL-2 and we can't because no one is at the facility.  CSW stated he could email via [place] email to [employer] and she stated no, that, "Clinical biochemist,  a Nurse and Resident Care Coordinator would have to review it".  CSW stated he would call Miss Behrendt back.  CSW documented the above, called Miss Behrendt back and stated the CSW was now going to call DSS and file a report with DSS and request an investigation for this discharge and asked if Miss Behrendt if the CSW could just send the FL-2 for review via [facility] email so she could review it with her staff tonight and Miss Claris Gower stated yes and to send it to:  [email or web address]  CSW confirmed this email X 2 and stated it would be sent and CSW would alert Miss Claris Gower when this was done.  CSW will continue to follow for D/C needs.  Alphonse Guild. Keyera Hattabaugh, LCSW, LCAS, CSI Transitions of Care Clinical Social Worker Care Coordination Department Ph: [phone]

## 2019-07-09 ENCOUNTER — Inpatient Hospital Stay (HOSPITAL_COMMUNITY): Payer: Medicare Other

## 2019-07-09 ENCOUNTER — Other Ambulatory Visit: Payer: Self-pay

## 2019-07-09 ENCOUNTER — Inpatient Hospital Stay (HOSPITAL_COMMUNITY)
Admission: EM | Admit: 2019-07-09 | Discharge: 2019-07-17 | DRG: 682 | Disposition: A | Discharge status: Skilled Nursing Facility | Payer: Medicare Other | Attending: Student | Admitting: Student

## 2019-07-09 ENCOUNTER — Emergency Department (HOSPITAL_COMMUNITY): Payer: Medicare Other

## 2019-07-09 ENCOUNTER — Encounter (HOSPITAL_COMMUNITY): Payer: Self-pay | Admitting: Emergency Medicine

## 2019-07-09 DIAGNOSIS — I5031 Acute diastolic (congestive) heart failure: Secondary | ICD-10-CM

## 2019-07-09 DIAGNOSIS — E875 Hyperkalemia: Secondary | ICD-10-CM | POA: Diagnosis present

## 2019-07-09 DIAGNOSIS — I110 Hypertensive heart disease with heart failure: Secondary | ICD-10-CM | POA: Diagnosis present

## 2019-07-09 DIAGNOSIS — Z79899 Other long term (current) drug therapy: Secondary | ICD-10-CM

## 2019-07-09 DIAGNOSIS — T502X5A Adverse effect of carbonic-anhydrase inhibitors, benzothiadiazides and other diuretics, initial encounter: Secondary | ICD-10-CM | POA: Diagnosis present

## 2019-07-09 DIAGNOSIS — K219 Gastro-esophageal reflux disease without esophagitis: Secondary | ICD-10-CM | POA: Diagnosis present

## 2019-07-09 DIAGNOSIS — I160 Hypertensive urgency: Secondary | ICD-10-CM | POA: Diagnosis present

## 2019-07-09 DIAGNOSIS — J9611 Chronic respiratory failure with hypoxia: Secondary | ICD-10-CM | POA: Diagnosis present

## 2019-07-09 DIAGNOSIS — F250 Schizoaffective disorder, bipolar type: Secondary | ICD-10-CM | POA: Diagnosis present

## 2019-07-09 DIAGNOSIS — R110 Nausea: Secondary | ICD-10-CM

## 2019-07-09 DIAGNOSIS — F17210 Nicotine dependence, cigarettes, uncomplicated: Secondary | ICD-10-CM | POA: Diagnosis present

## 2019-07-09 DIAGNOSIS — Z79891 Long term (current) use of opiate analgesic: Secondary | ICD-10-CM

## 2019-07-09 DIAGNOSIS — R5381 Other malaise: Secondary | ICD-10-CM | POA: Diagnosis present

## 2019-07-09 DIAGNOSIS — D638 Anemia in other chronic diseases classified elsewhere: Secondary | ICD-10-CM | POA: Diagnosis present

## 2019-07-09 DIAGNOSIS — K2900 Acute gastritis without bleeding: Secondary | ICD-10-CM | POA: Diagnosis not present

## 2019-07-09 DIAGNOSIS — G8929 Other chronic pain: Secondary | ICD-10-CM | POA: Diagnosis present

## 2019-07-09 DIAGNOSIS — J449 Chronic obstructive pulmonary disease, unspecified: Secondary | ICD-10-CM | POA: Diagnosis present

## 2019-07-09 DIAGNOSIS — N179 Acute kidney failure, unspecified: Secondary | ICD-10-CM | POA: Diagnosis present

## 2019-07-09 DIAGNOSIS — R6250 Unspecified lack of expected normal physiological development in childhood: Secondary | ICD-10-CM | POA: Diagnosis present

## 2019-07-09 DIAGNOSIS — K5900 Constipation, unspecified: Secondary | ICD-10-CM | POA: Diagnosis present

## 2019-07-09 DIAGNOSIS — R1013 Epigastric pain: Secondary | ICD-10-CM | POA: Diagnosis present

## 2019-07-09 DIAGNOSIS — M79605 Pain in left leg: Secondary | ICD-10-CM

## 2019-07-09 DIAGNOSIS — R109 Unspecified abdominal pain: Secondary | ICD-10-CM | POA: Diagnosis present

## 2019-07-09 DIAGNOSIS — J441 Chronic obstructive pulmonary disease with (acute) exacerbation: Secondary | ICD-10-CM | POA: Diagnosis present

## 2019-07-09 DIAGNOSIS — Z9981 Dependence on supplemental oxygen: Secondary | ICD-10-CM

## 2019-07-09 DIAGNOSIS — E222 Syndrome of inappropriate secretion of antidiuretic hormone: Secondary | ICD-10-CM | POA: Diagnosis present

## 2019-07-09 DIAGNOSIS — R631 Polydipsia: Secondary | ICD-10-CM | POA: Diagnosis present

## 2019-07-09 DIAGNOSIS — I501 Left ventricular failure: Secondary | ICD-10-CM

## 2019-07-09 DIAGNOSIS — Z7982 Long term (current) use of aspirin: Secondary | ICD-10-CM

## 2019-07-09 DIAGNOSIS — I5032 Chronic diastolic (congestive) heart failure: Secondary | ICD-10-CM | POA: Diagnosis not present

## 2019-07-09 DIAGNOSIS — M810 Age-related osteoporosis without current pathological fracture: Secondary | ICD-10-CM | POA: Diagnosis present

## 2019-07-09 DIAGNOSIS — E785 Hyperlipidemia, unspecified: Secondary | ICD-10-CM | POA: Diagnosis present

## 2019-07-09 DIAGNOSIS — F419 Anxiety disorder, unspecified: Secondary | ICD-10-CM | POA: Diagnosis present

## 2019-07-09 DIAGNOSIS — Z20822 Contact with and (suspected) exposure to covid-19: Secondary | ICD-10-CM | POA: Diagnosis present

## 2019-07-09 DIAGNOSIS — K429 Umbilical hernia without obstruction or gangrene: Secondary | ICD-10-CM

## 2019-07-09 DIAGNOSIS — Z6832 Body mass index (BMI) 32.0-32.9, adult: Secondary | ICD-10-CM

## 2019-07-09 DIAGNOSIS — K449 Diaphragmatic hernia without obstruction or gangrene: Secondary | ICD-10-CM | POA: Diagnosis present

## 2019-07-09 DIAGNOSIS — Z9049 Acquired absence of other specified parts of digestive tract: Secondary | ICD-10-CM

## 2019-07-09 DIAGNOSIS — I5033 Acute on chronic diastolic (congestive) heart failure: Secondary | ICD-10-CM | POA: Diagnosis present

## 2019-07-09 DIAGNOSIS — R609 Edema, unspecified: Secondary | ICD-10-CM

## 2019-07-09 DIAGNOSIS — K439 Ventral hernia without obstruction or gangrene: Secondary | ICD-10-CM | POA: Diagnosis present

## 2019-07-09 DIAGNOSIS — E871 Hypo-osmolality and hyponatremia: Secondary | ICD-10-CM | POA: Diagnosis present

## 2019-07-09 DIAGNOSIS — K2970 Gastritis, unspecified, without bleeding: Secondary | ICD-10-CM | POA: Diagnosis present

## 2019-07-09 DIAGNOSIS — E119 Type 2 diabetes mellitus without complications: Secondary | ICD-10-CM | POA: Diagnosis present

## 2019-07-09 DIAGNOSIS — F54 Psychological and behavioral factors associated with disorders or diseases classified elsewhere: Secondary | ICD-10-CM | POA: Diagnosis present

## 2019-07-09 DIAGNOSIS — Z6834 Body mass index (BMI) 34.0-34.9, adult: Secondary | ICD-10-CM

## 2019-07-09 DIAGNOSIS — F411 Generalized anxiety disorder: Secondary | ICD-10-CM | POA: Diagnosis not present

## 2019-07-09 DIAGNOSIS — K2950 Unspecified chronic gastritis without bleeding: Secondary | ICD-10-CM | POA: Diagnosis not present

## 2019-07-09 DIAGNOSIS — J42 Unspecified chronic bronchitis: Secondary | ICD-10-CM | POA: Diagnosis not present

## 2019-07-09 LAB — LIPASE, BLOOD: Lipase: 26 U/L (ref 11–51)

## 2019-07-09 LAB — OSMOLALITY, URINE: Osmolality, Ur: 130 mOsm/kg — ABNORMAL LOW (ref 300–900)

## 2019-07-09 LAB — CBC WITH DIFFERENTIAL/PLATELET
Abs Immature Granulocytes: 0.01 10*3/uL (ref 0.00–0.07)
Basophils Absolute: 0 10*3/uL (ref 0.0–0.1)
Basophils Relative: 1 %
Eosinophils Absolute: 0.1 10*3/uL (ref 0.0–0.5)
Eosinophils Relative: 3 %
HCT: 37 % (ref 36.0–46.0)
Hemoglobin: 12.6 g/dL (ref 12.0–15.0)
Immature Granulocytes: 0 %
Lymphocytes Relative: 25 %
Lymphs Abs: 1 10*3/uL (ref 0.7–4.0)
MCH: 30.7 pg (ref 26.0–34.0)
MCHC: 34.1 g/dL (ref 30.0–36.0)
MCV: 90.2 fL (ref 80.0–100.0)
Monocytes Absolute: 0.5 10*3/uL (ref 0.1–1.0)
Monocytes Relative: 11 %
Neutro Abs: 2.5 10*3/uL (ref 1.7–7.7)
Neutrophils Relative %: 60 %
Platelets: 167 10*3/uL (ref 150–400)
RBC: 4.1 MIL/uL (ref 3.87–5.11)
RDW: 12.8 % (ref 11.5–15.5)
WBC: 4.1 10*3/uL (ref 4.0–10.5)
nRBC: 0 % (ref 0.0–0.2)

## 2019-07-09 LAB — BASIC METABOLIC PANEL
Anion gap: 8 (ref 5–15)
Anion gap: 12 (ref 5–15)
BUN: 13 mg/dL (ref 6–20)
BUN: 15 mg/dL (ref 6–20)
CO2: 29 mmol/L (ref 22–32)
CO2: 25 mmol/L (ref 22–32)
Calcium: 8.5 mg/dL — ABNORMAL LOW (ref 8.9–10.3)
Calcium: 8.5 mg/dL — ABNORMAL LOW (ref 8.9–10.3)
Chloride: 87 mmol/L — ABNORMAL LOW (ref 98–111)
Chloride: 88 mmol/L — ABNORMAL LOW (ref 98–111)
Creatinine, Ser: 0.63 mg/dL (ref 0.44–1.00)
Creatinine, Ser: 0.95 mg/dL (ref 0.44–1.00)
GFR calc Af Amer: >60 mL/min (ref >60)
GFR calc Af Amer: >60 mL/min (ref >60)
GFR calc non Af Amer: >60 mL/min (ref >60)
GFR calc non Af Amer: >60 mL/min (ref >60)
Glucose, Bld: 91 mg/dL (ref 70–99)
Glucose, Bld: 177 mg/dL — ABNORMAL HIGH (ref 70–99)
Potassium: 5.3 mmol/L — ABNORMAL HIGH (ref 3.5–5.1)
Potassium: 5.3 mmol/L — ABNORMAL HIGH (ref 3.5–5.1)
Sodium: 124 mmol/L — ABNORMAL LOW (ref 135–145)
Sodium: 125 mmol/L — ABNORMAL LOW (ref 135–145)

## 2019-07-09 LAB — COMPREHENSIVE METABOLIC PANEL
ALT: 12 U/L (ref 0–44)
AST: 18 U/L (ref 15–41)
Albumin: 2.8 g/dL — ABNORMAL LOW (ref 3.5–5.0)
Alkaline Phosphatase: 65 U/L (ref 38–126)
Anion gap: 8 (ref 5–15)
BUN: 15 mg/dL (ref 6–20)
CO2: 27 mmol/L (ref 22–32)
Calcium: 8.4 mg/dL — ABNORMAL LOW (ref 8.9–10.3)
Chloride: 83 mmol/L — ABNORMAL LOW (ref 98–111)
Creatinine, Ser: 0.69 mg/dL (ref 0.44–1.00)
GFR calc Af Amer: >60 mL/min (ref >60)
GFR calc non Af Amer: >60 mL/min (ref >60)
Glucose, Bld: 88 mg/dL (ref 70–99)
Potassium: 5 mmol/L (ref 3.5–5.1)
Sodium: 118 mmol/L — CL (ref 135–145)
Total Bilirubin: 0.3 mg/dL (ref 0.3–1.2)
Total Protein: 6.7 g/dL (ref 6.5–8.1)

## 2019-07-09 LAB — TROPONIN I (HIGH SENSITIVITY)
Troponin I (High Sensitivity): 4 ng/L (ref <18)
Troponin I (High Sensitivity): 4 ng/L (ref <18)

## 2019-07-09 LAB — SARS CORONAVIRUS 2 (TAT 6-24 HRS): SARS Coronavirus 2: NEGATIVE

## 2019-07-09 LAB — D-DIMER, QUANTITATIVE: D-Dimer, Quant: 2.57 ug/mL-FEU — ABNORMAL HIGH (ref 0.00–0.50)

## 2019-07-09 LAB — PROTIME-INR
INR: 0.9 (ref 0.8–1.2)
Prothrombin Time: 12.5 seconds (ref 11.4–15.2)

## 2019-07-09 LAB — ECHOCARDIOGRAM COMPLETE

## 2019-07-09 LAB — BRAIN NATRIURETIC PEPTIDE: B Natriuretic Peptide: 178.8 pg/mL — ABNORMAL HIGH (ref 0.0–100.0)

## 2019-07-09 LAB — SODIUM, URINE, RANDOM: Sodium, Ur: 32 mmol/L

## 2019-07-09 LAB — OSMOLALITY: Osmolality: 258 mOsm/kg — ABNORMAL LOW (ref 275–295)

## 2019-07-09 MED ORDER — ASPIRIN 81 MG PO CHEW
81.0000 mg | CHEWABLE_TABLET | Freq: Every day | ORAL | Status: DC
  Administered 2019-07-09 – 2019-07-17 (×8): 81 mg via ORAL
  Filled 2019-07-09 (×8): qty 1

## 2019-07-09 MED ORDER — CITALOPRAM HYDROBROMIDE 20 MG PO TABS
20.0000 mg | ORAL_TABLET | Freq: Every day | ORAL | Status: DC
  Administered 2019-07-09 – 2019-07-13 (×5): 20 mg via ORAL
  Filled 2019-07-09 (×5): qty 1

## 2019-07-09 MED ORDER — HYDROXYZINE HCL 25 MG PO TABS
50.0000 mg | ORAL_TABLET | Freq: Three times a day (TID) | ORAL | Status: DC
  Administered 2019-07-09 – 2019-07-17 (×23): 50 mg via ORAL
  Filled 2019-07-09 (×5): qty 2
  Filled 2019-07-09: qty 1
  Filled 2019-07-09: qty 2
  Filled 2019-07-09: qty 1
  Filled 2019-07-09 (×5): qty 2
  Filled 2019-07-09 (×2): qty 1
  Filled 2019-07-09 (×3): qty 2
  Filled 2019-07-09: qty 1
  Filled 2019-07-09 (×2): qty 2
  Filled 2019-07-09 (×2): qty 1
  Filled 2019-07-09 (×2): qty 2

## 2019-07-09 MED ORDER — ALBUTEROL SULFATE HFA 108 (90 BASE) MCG/ACT IN AERS
2.0000 | INHALATION_SPRAY | Freq: Once | RESPIRATORY_TRACT | Status: AC
  Administered 2019-07-09: 2 via RESPIRATORY_TRACT
  Filled 2019-07-09: qty 6.7

## 2019-07-09 MED ORDER — FUROSEMIDE 10 MG/ML IJ SOLN
20.0000 mg | Freq: Once | INTRAMUSCULAR | Status: AC
  Administered 2019-07-09: 20 mg via INTRAVENOUS
  Filled 2019-07-09: qty 2

## 2019-07-09 MED ORDER — ALBUTEROL SULFATE (2.5 MG/3ML) 0.083% IN NEBU: 2.5000 mg | INHALATION_SOLUTION | RESPIRATORY_TRACT | Status: DC | PRN

## 2019-07-09 MED ORDER — LOPERAMIDE HCL 2 MG PO CAPS: 2.0000 mg | ORAL_CAPSULE | Freq: Four times a day (QID) | ORAL | Status: DC | PRN

## 2019-07-09 MED ORDER — OLANZAPINE 5 MG PO TABS
5.0000 mg | ORAL_TABLET | Freq: Every day | ORAL | Status: DC | PRN
  Administered 2019-07-13: 5 mg via ORAL
  Filled 2019-07-09 (×3): qty 1

## 2019-07-09 MED ORDER — BACITRACIN-NEOMYCIN-POLYMYXIN 400-5-5000 EX OINT
1.0000 "application " | TOPICAL_OINTMENT | CUTANEOUS | Status: DC | PRN
  Filled 2019-07-09: qty 1

## 2019-07-09 MED ORDER — ONDANSETRON HCL 4 MG PO TABS
4.0000 mg | ORAL_TABLET | Freq: Four times a day (QID) | ORAL | Status: DC | PRN
  Administered 2019-07-15: 4 mg via ORAL
  Filled 2019-07-09: qty 1

## 2019-07-09 MED ORDER — NYSTATIN 100000 UNIT/GM EX POWD
1.0000 "application " | Freq: Two times a day (BID) | CUTANEOUS | Status: DC | PRN
  Filled 2019-07-09: qty 15

## 2019-07-09 MED ORDER — BENZONATATE 100 MG PO CAPS
100.0000 mg | ORAL_CAPSULE | Freq: Three times a day (TID) | ORAL | Status: DC | PRN
  Administered 2019-07-13: 100 mg via ORAL
  Filled 2019-07-09: qty 1

## 2019-07-09 MED ORDER — METOPROLOL TARTRATE 25 MG PO TABS
25.0000 mg | ORAL_TABLET | Freq: Two times a day (BID) | ORAL | Status: DC
  Administered 2019-07-09 – 2019-07-17 (×11): 25 mg via ORAL
  Filled 2019-07-09 (×14): qty 1

## 2019-07-09 MED ORDER — GUAIFENESIN ER 600 MG PO TB12
1200.0000 mg | ORAL_TABLET | Freq: Every day | ORAL | Status: DC
  Administered 2019-07-09 – 2019-07-17 (×8): 1200 mg via ORAL
  Filled 2019-07-09 (×8): qty 2

## 2019-07-09 MED ORDER — OLANZAPINE 5 MG PO TABS
5.0000 mg | ORAL_TABLET | Freq: Two times a day (BID) | ORAL | Status: DC
  Administered 2019-07-09 – 2019-07-17 (×17): 5 mg via ORAL
  Filled 2019-07-09 (×18): qty 1

## 2019-07-09 MED ORDER — PANTOPRAZOLE SODIUM 40 MG PO TBEC
40.0000 mg | DELAYED_RELEASE_TABLET | Freq: Every day | ORAL | Status: DC
  Administered 2019-07-09 – 2019-07-12 (×3): 40 mg via ORAL
  Filled 2019-07-09 (×3): qty 1

## 2019-07-09 MED ORDER — CALCIUM CARBONATE ANTACID 500 MG PO CHEW
500.0000 mg | CHEWABLE_TABLET | Freq: Two times a day (BID) | ORAL | Status: DC | PRN
  Administered 2019-07-14: 500 mg via ORAL
  Filled 2019-07-09: qty 3

## 2019-07-09 MED ORDER — NIFEDIPINE ER OSMOTIC RELEASE 90 MG PO TB24
90.0000 mg | ORAL_TABLET | Freq: Every day | ORAL | Status: DC
  Administered 2019-07-09 – 2019-07-17 (×9): 90 mg via ORAL
  Filled 2019-07-09 (×9): qty 1

## 2019-07-09 MED ORDER — FUROSEMIDE 20 MG PO TABS
20.0000 mg | ORAL_TABLET | Freq: Every day | ORAL | Status: DC
  Administered 2019-07-10: 20 mg via ORAL
  Filled 2019-07-09: qty 1

## 2019-07-09 MED ORDER — DEMECLOCYCLINE HCL 150 MG PO TABS
150.0000 mg | ORAL_TABLET | Freq: Two times a day (BID) | ORAL | Status: DC
  Administered 2019-07-09 – 2019-07-11 (×5): 150 mg via ORAL
  Filled 2019-07-09 (×6): qty 1

## 2019-07-09 MED ORDER — HYDRALAZINE HCL 20 MG/ML IJ SOLN
10.0000 mg | Freq: Once | INTRAMUSCULAR | Status: AC
  Administered 2019-07-09: 10 mg via INTRAVENOUS
  Filled 2019-07-09: qty 1

## 2019-07-09 MED ORDER — ENSURE ENLIVE PO LIQD
237.0000 mL | ORAL | Status: DC
  Administered 2019-07-10 – 2019-07-17 (×6): 237 mL via ORAL
  Filled 2019-07-09: qty 237

## 2019-07-09 MED ORDER — MORPHINE SULFATE (PF) 4 MG/ML IV SOLN
4.0000 mg | Freq: Once | INTRAVENOUS | Status: AC
  Administered 2019-07-09: 4 mg via INTRAVENOUS
  Filled 2019-07-09: qty 1

## 2019-07-09 MED ORDER — SODIUM CHLORIDE 1 G PO TABS
1.0000 g | ORAL_TABLET | Freq: Four times a day (QID) | ORAL | Status: DC
  Administered 2019-07-09 – 2019-07-11 (×10): 1 g via ORAL
  Filled 2019-07-09 (×14): qty 1

## 2019-07-09 MED ORDER — BUPROPION HCL ER (XL) 150 MG PO TB24
150.0000 mg | ORAL_TABLET | Freq: Every day | ORAL | Status: DC
  Administered 2019-07-09 – 2019-07-17 (×8): 150 mg via ORAL
  Filled 2019-07-09 (×8): qty 1

## 2019-07-09 MED ORDER — ALUM & MAG HYDROXIDE-SIMETH 200-200-20 MG/5ML PO SUSP
15.0000 mL | Freq: Four times a day (QID) | ORAL | Status: DC | PRN
  Administered 2019-07-10 – 2019-07-16 (×5): 15 mL via ORAL
  Filled 2019-07-09 (×5): qty 30

## 2019-07-09 MED ORDER — ACETAMINOPHEN 325 MG PO TABS
650.0000 mg | ORAL_TABLET | Freq: Four times a day (QID) | ORAL | Status: DC | PRN
  Administered 2019-07-10 – 2019-07-17 (×3): 650 mg via ORAL
  Filled 2019-07-09 (×3): qty 2

## 2019-07-09 MED ORDER — FLUTICASONE PROPIONATE 50 MCG/ACT NA SUSP
2.0000 | Freq: Two times a day (BID) | NASAL | Status: DC
  Administered 2019-07-09 – 2019-07-17 (×14): 2 via NASAL
  Filled 2019-07-09: qty 16

## 2019-07-09 MED ORDER — SALINE SPRAY 0.65 % NA SOLN
2.0000 | NASAL | Status: DC | PRN
  Filled 2019-07-09: qty 44

## 2019-07-09 MED ORDER — LORATADINE 10 MG PO TABS
10.0000 mg | ORAL_TABLET | Freq: Every day | ORAL | Status: DC
  Administered 2019-07-09 – 2019-07-17 (×8): 10 mg via ORAL
  Filled 2019-07-09 (×8): qty 1

## 2019-07-09 MED ORDER — POLYETHYLENE GLYCOL 3350 17 G PO PACK
17.0000 g | PACK | Freq: Every day | ORAL | Status: DC
  Administered 2019-07-10 – 2019-07-16 (×4): 17 g via ORAL
  Filled 2019-07-09 (×7): qty 1

## 2019-07-09 MED ORDER — HYDRALAZINE HCL 20 MG/ML IJ SOLN
10.0000 mg | INTRAMUSCULAR | Status: DC | PRN
  Administered 2019-07-11: 5 mg via INTRAVENOUS
  Administered 2019-07-14: 10 mg via INTRAVENOUS
  Filled 2019-07-09: qty 1

## 2019-07-09 MED ORDER — GABAPENTIN 100 MG PO CAPS
100.0000 mg | ORAL_CAPSULE | Freq: Two times a day (BID) | ORAL | Status: DC
  Administered 2019-07-09 – 2019-07-14 (×10): 100 mg via ORAL
  Filled 2019-07-09 (×10): qty 1

## 2019-07-09 MED ORDER — VALPROIC ACID 250 MG PO CAPS
500.0000 mg | ORAL_CAPSULE | Freq: Three times a day (TID) | ORAL | Status: DC
  Administered 2019-07-09 – 2019-07-17 (×24): 500 mg via ORAL
  Filled 2019-07-09 (×29): qty 2

## 2019-07-09 MED ORDER — DOCUSATE SODIUM 100 MG PO CAPS
100.0000 mg | ORAL_CAPSULE | Freq: Two times a day (BID) | ORAL | Status: DC
  Administered 2019-07-09 – 2019-07-17 (×15): 100 mg via ORAL
  Filled 2019-07-09 (×14): qty 1

## 2019-07-09 MED ORDER — IPRATROPIUM-ALBUTEROL 0.5-2.5 (3) MG/3ML IN SOLN
3.0000 mL | Freq: Three times a day (TID) | RESPIRATORY_TRACT | Status: DC
  Administered 2019-07-09 – 2019-07-15 (×18): 3 mL via RESPIRATORY_TRACT
  Filled 2019-07-09 (×20): qty 3

## 2019-07-09 MED ORDER — ENOXAPARIN SODIUM 40 MG/0.4ML ~~LOC~~ SOLN
40.0000 mg | SUBCUTANEOUS | Status: DC
  Administered 2019-07-09 – 2019-07-10 (×2): 40 mg via SUBCUTANEOUS
  Filled 2019-07-09 (×2): qty 0.4

## 2019-07-09 MED ORDER — NICOTINE 7 MG/24HR TD PT24
7.0000 mg | MEDICATED_PATCH | Freq: Every day | TRANSDERMAL | Status: DC
  Administered 2019-07-09 – 2019-07-17 (×9): 7 mg via TRANSDERMAL
  Filled 2019-07-09 (×9): qty 1

## 2019-07-09 MED ORDER — LURASIDONE HCL 20 MG PO TABS
20.0000 mg | ORAL_TABLET | Freq: Two times a day (BID) | ORAL | Status: DC
  Administered 2019-07-09 – 2019-07-17 (×17): 20 mg via ORAL
  Filled 2019-07-09 (×18): qty 1

## 2019-07-09 MED ORDER — LISINOPRIL 40 MG PO TABS
40.0000 mg | ORAL_TABLET | Freq: Every day | ORAL | Status: DC
  Administered 2019-07-09 – 2019-07-11 (×2): 40 mg via ORAL
  Filled 2019-07-09: qty 1
  Filled 2019-07-09: qty 2

## 2019-07-09 MED ORDER — SODIUM CHLORIDE 0.9% FLUSH
3.0000 mL | Freq: Two times a day (BID) | INTRAVENOUS | Status: DC
  Administered 2019-07-09 – 2019-07-17 (×13): 3 mL via INTRAVENOUS

## 2019-07-09 MED ORDER — ONDANSETRON HCL 4 MG/2ML IJ SOLN
4.0000 mg | Freq: Four times a day (QID) | INTRAMUSCULAR | Status: DC | PRN
  Administered 2019-07-10 – 2019-07-17 (×9): 4 mg via INTRAVENOUS
  Filled 2019-07-09 (×8): qty 2

## 2019-07-09 MED ORDER — FLUTICASONE FUROATE-VILANTEROL 100-25 MCG/INH IN AEPB
1.0000 | INHALATION_SPRAY | Freq: Every day | RESPIRATORY_TRACT | Status: DC
  Administered 2019-07-10 – 2019-07-17 (×7): 1 via RESPIRATORY_TRACT
  Filled 2019-07-09: qty 28

## 2019-07-09 MED ORDER — METHYLPREDNISOLONE SODIUM SUCC 125 MG IJ SOLR
125.0000 mg | Freq: Once | INTRAMUSCULAR | Status: AC
  Administered 2019-07-09: 125 mg via INTRAVENOUS
  Filled 2019-07-09: qty 2

## 2019-07-09 MED ORDER — OXYCODONE HCL 5 MG PO TABS
5.0000 mg | ORAL_TABLET | Freq: Two times a day (BID) | ORAL | Status: DC | PRN
  Administered 2019-07-09 – 2019-07-14 (×5): 5 mg via ORAL
  Filled 2019-07-09 (×7): qty 1

## 2019-07-09 MED ORDER — ONDANSETRON HCL 4 MG/2ML IJ SOLN
4.0000 mg | Freq: Once | INTRAMUSCULAR | Status: AC
  Administered 2019-07-09: 4 mg via INTRAVENOUS
  Filled 2019-07-09: qty 2

## 2019-07-09 MED ORDER — CLONAZEPAM 0.5 MG PO TABS
0.5000 mg | ORAL_TABLET | Freq: Two times a day (BID) | ORAL | Status: DC | PRN
  Administered 2019-07-11 – 2019-07-15 (×5): 0.5 mg via ORAL
  Filled 2019-07-09 (×5): qty 1

## 2019-07-09 MED ORDER — MELATONIN 3 MG PO TABS
6.0000 mg | ORAL_TABLET | Freq: Every day | ORAL | Status: DC
  Administered 2019-07-09 – 2019-07-16 (×8): 6 mg via ORAL
  Filled 2019-07-09 (×9): qty 2

## 2019-07-09 MED ORDER — CLONAZEPAM 0.5 MG PO TABS
1.0000 mg | ORAL_TABLET | Freq: Three times a day (TID) | ORAL | Status: DC
  Administered 2019-07-09 – 2019-07-17 (×24): 1 mg via ORAL
  Filled 2019-07-09 (×24): qty 2

## 2019-07-09 MED ORDER — ACETAMINOPHEN 650 MG RE SUPP: 650.0000 mg | Freq: Four times a day (QID) | RECTAL | Status: DC | PRN

## 2019-07-09 NOTE — ED Triage Notes (Addendum)
Patient arrived with EMS from [facility] home reports central chest pain and generalized abdominal pain for several days with SOB/cough and nausea , denies emesis or diarrhea , no fever or chills . History of CHF/COPD .

## 2019-07-09 NOTE — ED Notes (Signed)
Pt's aunt updated, message left for legal guardian

## 2019-07-09 NOTE — H&P (Signed)
History and Physical    Natalie Thomas DOB: 29-Sep-1960 DOA: 07/09/2019  Referring MD/NP/PA: Varney Biles, MD PCP: System, Pcp Not In  Patient coming from: Skilled nursing facility via EMS  Chief Complaint: Abdominal pain  I have personally briefly reviewed patient's old medical records in [place]   HPI: Natalie Thomas is a 59 y.o. female with medical history significant of  bipolar/schizoaffective disorder, HTN, HLD, DM, COPDon 2L oxygen, chronic hyponatremia anddiastolicCHF.  She presents with complaints of epigastric abdominal pain.  Complains of having gagging and dry heaves which further worsens her abdominal pain.  Believe symptoms are related with her abdominal hernia.  Patient reports that she likes to drink sodas and ice water, but cannot quantify how much of this she is eating.  Other associated symptoms include difficulty breathing, chronic productive cough, constipation, and chest pain.  Denies having any fever or dysuria symptoms.  She reports that her last bowel movement was yesterday patient just recently admitted on 1/5-1/11  with similar abdominal pain complaints found to have sodium of 121.  Patient was put on fluid restriction with improvement in sodium and given stool softeners with improvement in symptoms.  ED Course: Upon admission into the emergency department patient was noted to be afebrile, pulse 59, blood pressure 137/60 vital signs maintained.  Labs significant for sodium 118, chloride 83, BNP 178.8, D-dimer 2.57, and troponin negative x2.  Chest x-ray significant for cardiomegaly with signs of pleural effusion and interstitial edema.  Doppler ultrasound of bilateral lower extremities did not reveal any signs of the DVT.  Patient was given 125 mg of Solu-Medrol IV, morphine, furosemide 20 mg IV, and restarted on home blood pressure medications.  Review of Systems  Constitutional: Positive for malaise/fatigue. Negative for fever.    HENT: Negative for nosebleeds.   Eyes: Negative for photophobia and pain.  Respiratory: Positive for shortness of breath. Negative for stridor.   Cardiovascular: Positive for chest pain and leg swelling.  Gastrointestinal: Positive for abdominal pain, constipation and nausea.       Positive for dry heaves  Genitourinary: Negative for dysuria and hematuria.  Musculoskeletal: Positive for myalgias. Negative for falls.  Skin: Negative for itching.  Neurological: Negative for focal weakness and loss of consciousness.  Psychiatric/Behavioral: Negative for substance abuse.    Past Medical History:  Diagnosis Date  . Anemia 10/16/2017  . Anxiety and depression   . CHF (congestive heart failure) ([place])   . Chronic hyponatremia 04/08/2007   Qualifier: Diagnosis of  By: Marca Ancona RMA, Lucy    . Closed comminuted intertrochanteric fracture of right femur ([place]) 11/07/2017  . COPD 04/08/2007   Qualifier: Diagnosis of  By: Marca Ancona RMA, Lucy    . Depression   . Diabetes mellitus, type 2 ([place])    pt denies but states that she has been treated for DM  . Essential hypertension 04/08/2007   Qualifier: Diagnosis of  By: Marca Ancona RMA, Lucy    . GERD (gastroesophageal reflux disease)   . Hyperlipidemia 10/07/2017  . Lumbar disc disease 04/09/2011  . MENOPAUSE, PREMATURE 04/08/2007   Qualifier: Diagnosis of  By: Marca Ancona RMA, Lucy    . 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 Osteoporosis 04/08/2007   Qualifier: Diagnosis of  By: Reatha Armour, Lucy    . Polycythemia secondary to smoking 04/09/2011  . Schizoaffective disorder, bipolar type ([place]) 07/05/2011    Past Surgical History:  Procedure Laterality  Date  . EYE SURGERY     on left  eye, pt states that she sees bad out of the right eye and needs surgery there  . INTRAMEDULLARY (IM) NAIL INTERTROCHANTERIC Right 11/13/2017   Procedure: INTRAMEDULLARY (IM) NAIL INTERTROCHANTRIC;  Surgeon: Shona Needles, MD;   Location: [facility];  Service: Orthopedics;  Laterality: Right;  . PELVIC FRACTURE SURGERY       reports that she has been smoking cigarettes. She has been smoking about 0.25 packs per day. She has never used smokeless tobacco. She reports that she does not drink alcohol or use drugs.  Allergies  Allergen Reactions  . Clarithromycin Itching  . Penicillins Itching    Has tolerated cefazolin before  Has patient had a PCN reaction causing immediate rash, facial/tongue/throat swelling, SOB or lightheadedness with hypotension: No Has patient had a PCN reaction causing severe rash involving mucus membranes or skin necrosis: No Has patient had a PCN reaction that required hospitalization: Unknown Has patient had a PCN reaction occurring within the last 10 years: No If all of the above answers are "NO", then may proceed with Cephalosporin use.     No family history on file.  Prior to Admission medications   Medication Sig Start Date End Date Taking? Authorizing Provider  acetaminophen (TYLENOL) 325 MG tablet Take 325 mg by mouth every 6 (six) hours as needed for fever. Not to exceed 2000MG     [provider]  acetaminophen (TYLENOL) 500 MG tablet Take 1,000 mg by mouth every 8 (eight) hours as needed for mild pain or moderate pain. Not to exceed 3g/24h of tylenol from all sources.    [provider]  albuterol (PROVENTIL) (2.5 MG/3ML) 0.083% nebulizer solution Take 3 mLs (2.5 mg total) by nebulization every 4 (four) hours as needed for wheezing or shortness of breath. 12/11/18   Swayze, Ava, DO  Alum & Mag Hydroxide-Simeth (ANTACID LIQUID PO) Take 30 mLs by mouth every 6 (six) hours as needed (heartburn).    [provider]  Amino Acids-Protein Hydrolys (FEEDING SUPPLEMENT, PRO-STAT SUGAR FREE 64,) LIQD Take 30 mLs by mouth 2 (two) times daily. 03/14/19   Desiree Hane, MD  aspirin 81 MG chewable tablet Chew 81 mg by mouth daily.    [provider]  benzonatate  (TESSALON) 100 MG capsule Take 100 mg by mouth every 8 (eight) hours as needed for cough.    [provider]  buPROPion (WELLBUTRIN XL) 150 MG 24 hr tablet Take 150 mg by mouth daily.    [provider]  Calcium Carb-Cholecalciferol (CALCIUM 600+D) 600-800 MG-UNIT TABS Take 1 tablet by mouth daily.    [provider]  Calcium Carbonate 500 MG CHEW Chew 500 mg by mouth every 12 (twelve) hours as needed (indigestion).    [provider]  citalopram (CELEXA) 20 MG tablet Take 1 tablet (20 mg total) by mouth at bedtime. 05/15/19   Hongalgi, Lenis Dickinson, MD  clonazePAM (KLONOPIN) 0.5 MG tablet Take 0.5 mg by mouth 2 (two) times daily as needed for anxiety.    [provider]  clonazePAM (KLONOPIN) 1 MG tablet Take 1 tablet (1 mg total) by mouth 2 (two) times daily. 05/15/19   Hongalgi, Lenis Dickinson, MD  demeclocycline (DECLOMYCIN) 150 MG tablet Take 150 mg by mouth 2 (two) times daily.    [provider]  docusate sodium (COLACE) 100 MG capsule Take 100 mg by mouth See admin instructions. Take one capsule (100 mg) by mouth every  12 hours, may also take one capsule (100 mg) twice daily as needed for constipation    [provider]  feeding supplement, ENSURE ENLIVE, (ENSURE ENLIVE) LIQD Take 237 mLs by mouth daily. 03/14/19   Desiree Hane, MD  fluticasone (FLONASE) 50 MCG/ACT nasal spray Place 2 sprays into both nostrils 2 (two) times daily.    [provider]  fluticasone furoate-vilanterol (BREO ELLIPTA) 100-25 MCG/INH AEPB Inhale 1 puff into the lungs daily.    [provider]  furosemide (LASIX) 20 MG tablet Take 20 mg by mouth.    [provider]  gabapentin (NEURONTIN) 100 MG capsule Take 1 capsule (100 mg total) by mouth 2 (two) times daily. 05/15/19   Hongalgi, Lenis Dickinson, MD  guaiFENesin (MUCINEX) 600 MG 12 hr tablet Take 1,200 mg by mouth daily.    [provider]  guaiFENesin (ROBITUSSIN) 100 MG/5ML liquid  Take 200 mg by mouth every 8 (eight) hours as needed for cough.    [provider]  hydrOXYzine (VISTARIL) 50 MG capsule Take 50 mg by mouth 3 (three) times daily.    [provider]  ipratropium-albuterol (DUONEB) 0.5-2.5 (3) MG/3ML SOLN Take 3 mLs by nebulization 3 (three) times daily.    [provider]  lisinopril (ZESTRIL) 40 MG tablet Take 1 tablet (40 mg total) by mouth daily. 04/25/19 06/29/20  [place] Eric J, DO  loperamide (IMODIUM A-D) 2 MG tablet Take 2 mg by mouth every 6 (six) hours as needed for diarrhea or loose stools. 06/28/19   [provider]  loratadine (CLARITIN) 10 MG tablet Take 10 mg by mouth daily.    [provider]  lurasidone (LATUDA) 20 MG TABS tablet Take 1 tablet (20 mg total) by mouth 2 (two) times daily. 05/15/19   Hongalgi, Lenis Dickinson, MD  Melatonin 5 MG TABS Take 5 mg by mouth at bedtime.    [provider]  methocarbamol (ROBAXIN) 500 MG tablet Take 500 mg by mouth daily.    [provider]  metoprolol tartrate (LOPRESSOR) 25 MG tablet Take 1 tablet (25 mg total) by mouth 2 (two) times daily. 12/17/18   Kayleen Memos, DO  Multiple Vitamin (MULTIVITAMIN WITH MINERALS) TABS tablet Take 1 tablet by mouth daily. 03/15/19   Desiree Hane, MD  neomycin-bacitracin-polymyxin (NEOSPORIN) ophthalmic ointment 1 application as needed (minor skin tears or abrasions).    [provider]  nicotine (NICODERM CQ - DOSED IN MG/24 HR) 7 mg/24hr patch Place 7 mg onto the skin daily.    [provider]  NIFEdipine (PROCARDIA XL/ADALAT-CC) 90 MG 24 hr tablet Take 90 mg by mouth daily.    [provider]  nystatin (NYSTATIN) powder Apply 1 application topically 2 (two) times daily as needed (irritation). Apply topically to reddened area twice daily as needed for irritation    [provider]  OLANZapine (ZYPREXA) 5 MG tablet Take 1 tablet (5 mg total) by mouth 2 (two) times daily. Patient  taking differently: Take 5 mg by mouth daily as needed (agitation).  05/15/19   Hongalgi, Lenis Dickinson, MD  OLANZapine (ZYPREXA) 5 MG tablet Take 5 mg by mouth 2 (two) times daily.     [provider]  omeprazole (PRILOSEC) 40 MG capsule Take 1 capsule (40 mg total) by mouth at bedtime. 05/15/19   Hongalgi, Lenis Dickinson, MD  ondansetron (ZOFRAN) 8 MG tablet Take 8 mg by mouth every 6 (six) hours as needed for nausea or vomiting.  [provider]  oxyCODONE (OXY IR/ROXICODONE) 5 MG immediate release tablet Take 5 mg by mouth 2 (two) times daily as needed for severe pain.    [provider]  polyethylene glycol (MIRALAX) 17 g packet Take 17 g by mouth daily. Patient taking differently: Take 17 g by mouth daily. Mix 1 capful (17 g) in 4-8 oz of water or juice and give by mouth once daily for bowel regimen 12/11/18   Swayze, Ava, DO  sodium chloride (OCEAN) 0.65 % SOLN nasal spray Place 2 sprays into both nostrils every 2 (two) hours as needed (dryness, irritation).    [provider]  sodium chloride 1 g tablet Take 1 tablet (1 g total) by mouth 3 (three) times daily with meals. Patient taking differently: Take 1 g by mouth 4 (four) times daily.  12/17/18   Kayleen Memos, DO  valproic acid (DEPAKENE) 250 MG capsule Take 2 capsules (500 mg total) by mouth 2 (two) times daily. Patient taking differently: Take 500 mg by mouth 3 (three) times daily.  12/11/18   Swayze, Ava, DO  Zinc Gluconate 100 MG TABS Take 100 mg by mouth daily.    [provider]    Physical Exam:  Constitutional: Obese female who appears older than stated age no acute distress Vitals:   07/09/19 0651  BP: (!) 197/60  Pulse: (!) 59  Resp: 19  Temp: 97.9 F (36.6 C)  TempSrc: Oral  SpO2: 98%   Eyes: PERRL, lids and conjunctivae normal ENMT: Mucous membranes are moist. Posterior pharynx clear of any exudate or lesions.   Neck: normal, supple, no masses, no thyromegaly.  JVD  appreciated. Respiratory: Coarse breath sounds with expiratory wheezing appreciated.  Patient currently on 2 L of nasal cannula oxygen with O2 saturation maintained Cardiovascular: Bradycardic, no murmurs / rubs / gallops.  Trace bilateral extremity edema. 2+ pedal pulses. No carotid bruits.  Abdomen: Reducible ventral hernia appreciated.  Bowel sounds present. Musculoskeletal: no clubbing / cyanosis. No joint deformity upper and lower extremities. Good ROM, no contractures. Normal muscle tone.  Skin: no rashes, lesions, ulcers. No induration Neurologic: CN 2-12 grossly intact. Sensation intact, DTR normal. Strength 5/5 in all 4.  Psychiatric: Normal judgment and insight. Alert and oriented x 3.  Anxious mood.     Labs on Admission: I have personally reviewed following labs and imaging studies  CBC: Recent Labs  Lab 07/03/19 0424 07/09/19 0725  WBC 6.8 4.1  NEUTROABS 4.8 2.5  HGB 11.6* 12.6  HCT 36.3 37.0  MCV 94.3 90.2  PLT 177 [id number]   Basic Metabolic Panel: Recent Labs  Lab 07/03/19 0424 07/04/19 0411 07/09/19 0725  NA 129* 130* 118*  K 5.0 5.1 5.0  CL 92* 93* 83*  CO2 29 29 27   GLUCOSE 117* 95 88  BUN 28* 42* 15  CREATININE 0.71 0.89 0.69  CALCIUM 8.4* 8.4* 8.4*   GFR: Estimated Creatinine Clearance: 84.5 mL/min (by C-G formula based on SCr of 0.69 mg/dL). Liver Function Tests: Recent Labs  Lab 07/03/19 0424 07/04/19 0411 07/09/19 0725  AST 9* 8* 18  ALT 9 7 12   ALKPHOS 71 66 65  BILITOT 0.3 0.5 0.3  PROT 5.8* 5.9* 6.7  ALBUMIN 2.8* 2.9* 2.8*   Recent Labs  Lab 07/09/19 0725  LIPASE 26   No results for input(s): AMMONIA in the last 168 hours. Coagulation Profile: Recent Labs  Lab 07/09/19 0725  INR 0.9   Cardiac Enzymes: No results for input(s): CKTOTAL,  CKMB, CKMBINDEX, TROPONINI in the last 168 hours. BNP (last 3 results) No results for input(s): PROBNP in the last 8760 hours. HbA1C: No results for input(s): HGBA1C in the last 72  hours. CBG: No results for input(s): GLUCAP in the last 168 hours. Lipid Profile: No results for input(s): CHOL, HDL, LDLCALC, TRIG, CHOLHDL, LDLDIRECT in the last 72 hours. Thyroid Function Tests: No results for input(s): TSH, T4TOTAL, FREET4, T3FREE, THYROIDAB in the last 72 hours. Anemia Panel: No results for input(s): VITAMINB12, FOLATE, FERRITIN, TIBC, IRON, RETICCTPCT in the last 72 hours. Urine analysis:    Component Value Date/Time   COLORURINE YELLOW 07/02/2019 1048   APPEARANCEUR CLEAR 07/02/2019 1048   APPEARANCEUR Clear 06/22/2012 0618   LABSPEC 1.012 07/02/2019 1048   LABSPEC 1.003 06/22/2012 0618   PHURINE 6.0 07/02/2019 1048   GLUCOSEU NEGATIVE 07/02/2019 1048   GLUCOSEU Negative 06/22/2012 0618   GLUCOSEU NEGATIVE 04/09/2011 1058   HGBUR NEGATIVE 07/02/2019 1048   BILIRUBINUR NEGATIVE 07/02/2019 1048   BILIRUBINUR Negative 06/22/2012 0618   KETONESUR NEGATIVE 07/02/2019 1048   PROTEINUR NEGATIVE 07/02/2019 1048   UROBILINOGEN 1.0 08/18/2011 1805   NITRITE NEGATIVE 07/02/2019 1048   LEUKOCYTESUR NEGATIVE 07/02/2019 1048   LEUKOCYTESUR Trace 06/22/2012 0618   Sepsis Labs: Recent Results (from the past 240 hour(s))  SARS CORONAVIRUS 2 (TAT 6-24 HRS) Nasopharyngeal Nasopharyngeal Swab     Status: None   Collection Time: 06/30/19  8:40 PM   Specimen: Nasopharyngeal Swab  Result Value Ref Range Status   SARS Coronavirus 2 NEGATIVE NEGATIVE Final    Comment: (NOTE) SARS-CoV-2 target nucleic acids are NOT DETECTED. The SARS-CoV-2 RNA is generally detectable in upper and lower respiratory specimens during the acute phase of infection. Negative results do not preclude SARS-CoV-2 infection, do not rule out co-infections with other pathogens, and should not be used as the sole basis for treatment or other patient management decisions. Negative results must be combined with clinical observations, patient history, and epidemiological information. The expected result  is Negative. Fact Sheet for Patients: SugarRoll.be Fact Sheet for Healthcare Providers: https://www.woods-mathews.com/ This test is not yet approved or cleared by the [place] FDA and  has been authorized for detection and/or diagnosis of SARS-CoV-2 by FDA under an Emergency Use Authorization (EUA). This EUA will remain  in effect (meaning this test can be used) for the duration of the COVID-19 declaration under Section 56 4(b)(1) of the Act, 21 U.S.C. section 360bbb-3(b)(1), unless the authorization is terminated or revoked sooner. Performed at [facility], [place] [address], [place], [place] [address]   MRSA PCR Screening     Status: None   Collection Time: 07/01/19  2:27 AM   Specimen: Nasal Mucosa; Nasopharyngeal  Result Value Ref Range Status   MRSA by PCR NEGATIVE NEGATIVE Final    Comment:        The GeneXpert MRSA Assay (FDA approved for NASAL specimens only), is one component of a comprehensive MRSA colonization surveillance program. It is not intended to diagnose MRSA infection nor to guide or monitor treatment for MRSA infections. Performed at [facility], [place] [address], [place], [place] [address]   Culture, Urine     Status: None   Collection Time: 07/02/19 10:48 AM   Specimen: Urine, Catheterized  Result Value Ref Range Status   Specimen Description   Final    URINE, CATHETERIZED Performed at [place] [address] [place], [place] [address]    Special Requests  Final    NONE Performed at [facility], [place] [address] [place], [place] [address]    Culture   Final    NO GROWTH Performed at [facility], [place] [address], [place], [place] [address]    Report Status 07/03/2019 FINAL  Final     Radiological Exams on Admission: DG Chest Port 1 View  Result Date: 07/09/2019 CLINICAL DATA:  Nausea, hernia history EXAM: PORTABLE CHEST 1  VIEW COMPARISON:  03/13/2019 and CT abdomen and pelvis of 07/01/2019 FINDINGS: Cardiomediastinal contours remain enlarged, accentuated by portable technique. The left hemidiaphragm is obscured likely related to mixture of airspace disease and pleural effusion. Mild increase in interstitial markings bilaterally. Signs of aortic atherosclerosis. Visualized skeletal structures are unremarkable. IMPRESSION: 1. Stable enlargement of the cardiomediastinal contours with presumed left effusion and basilar airspace disease. 2. Mild increase in interstitial markings may represent mild pulmonary edema. Electronically Signed   By: Zetta Bills M.D.   On: 07/09/2019 08:20   DG Abd Portable 1 View  Result Date: 07/09/2019 CLINICAL DATA:  Nausea, history of hernia EXAM: PORTABLE ABDOMEN - 1 VIEW COMPARISON:  CT of 07/01/2019 FINDINGS: Scattered gas-filled loops of bowel throughout the abdomen. Imaging obtained in supine position. No signs of free air or definitive signs of obstruction on plain film radiograph. Signs of bilateral sacral plasty. Post op changes of cholecystectomy and prior femoral ORIF partially imaged. No acute bone process. Signs of vascular calcification. IMPRESSION: Scattered gas-filled loops of bowel throughout the abdomen. No signs of free air or definitive signs of obstruction on supine radiography. Electronically Signed   By: Zetta Bills M.D.   On: 07/09/2019 08:22    EKG: Independently reviewed.  Sinus rhythm at 58 bpm  Assessment/Plan Abdominal pain, ventral hernia: Acute on chronic.  Abdominal x-rays revealed scattered gas filled loops of bowel throughout the abdomen, but no free air or definitive bowel obstruction noted.  Ventral hernia appreciated on physical exam, but is easily reducible. -Admit to a medical telemetry bed -Continue home pain medication of oxycodone  Hyponatremia: Acute on chronic: Patient presents with sodium down to 118.  Patient with previous history of SIADH for  which she is on dicyclomine 150 mg twice daily and sodium 1 g tablets 4 times daily.  Previously noted possible aspect of polydipsia and per patient it does not appear that she is monitoring how much she is drinking daily.. -Check urine sodium(32) and urine osmolarity(130) -Fluid restriction of 1500 mL/day  -Continue dicyclomine and sodium tablets -Goal correction of  sodium <10 mmol/L within a 24-hour -BMP every 4 hours  Hypertensive urgency: Blood pressures initially noted elevated up to 197/60.  Home medications include furosemide 20 mg daily, lisinopril 40 mg daily, metoprolol 25 mg twice daily, and nifedipine 90 mg daily. -Continue home regimen -Hydralazine IV as needed  Diastolic CHF exacerbation: Acute on chronic. Labs significant for BNP 178.8 which appears lower than previous.  Chest x-ray concerning for presumed left-sided pleural effusion with concern for possible mild interstitial edema.  Last EF noted to be greater than 65% by echocardiogram in June 2020.  Patient had been given 20 mg of Lasix IV this a.m. -Strict intake and output -Daily weights  -Check echocardiogram -Give additional 20 mg of Lasix IV at 1800 as long as patient sodium levels appear to be improving -Reassess in a.m. and determine if patient can be put back on home oral Lasix dose  COPD exacerbation, respiratory failure with hypoxia: Patient on 2  L nasal cannula oxygen at baseline and presents with worsening shortness of breath.  Chest x-ray concerning for interstitial edema -Continue Breo Ellipta and duonebs 4 times daily  Constipation: Acute on chronic.  Patient reports only having a small bowel movement yesterday. -Continue MiraLAX and Colace  Bipolar/schizoaffective disorder/anxiety: During previous admission psychiatry was consulted, but did not give any further recommendations and recommended outpatient follow-up -Continue Celexa, Vistaril, Latuda, Zyprexa, and Klonopin as needed for  anxiety  GERD -Continue pharmacy substitution for omeprazole  Tobacco abuse -Continue nicotine patch  DVT prophylaxis: lovenox Code Status: Full Family Communication: No family present at bedside Disposition Plan: SNF Consults called: None Admission status: Inpatient  Norval Morton MD [facility] Pager [phone]   If 7PM-7AM, please contact night-coverage [email or web address] Password [facility]  07/09/2019, 9:19 AM

## 2019-07-09 NOTE — ED Provider Notes (Signed)
[facility] EMERGENCY DEPARTMENT Provider Note   CSN: [id number] Arrival date & time: 07/09/19  [id number]     History Chief Complaint  Patient presents with  . Chest Pain / Abdominal Pain    Natalie Thomas is a 59 y.o. female.  HPI     59 year old with history of CHF, COPD, diabetes, umbilical hernia comes in a chief complaint of abdominal pain, nausea, chest pain and shortness of breath.  Patient is not a very good historian.  She reports having some abdominal pain that is epigastric and in the lower abdominal quadrants.  Associated with that she has nausea and gagging.  She has not had any emesis yet.  She is unsure if she is passing flatus and does not recall the last time she had a BM.  She is also having chest discomfort, but having difficulty describing the pain.  It does not appear to be a heaviness type pain.  She has no known history of CAD.  Patient has COPD but denies any wheezing or new cough.  Past Medical History:  Diagnosis Date  . Anemia 10/16/2017  . Anxiety and depression   . CHF (congestive heart failure) ([place])   . Chronic hyponatremia 04/08/2007   Qualifier: Diagnosis of  By: Marca Ancona RMA, Lucy    . Closed comminuted intertrochanteric fracture of right femur ([place]) 11/07/2017  . COPD 04/08/2007   Qualifier: Diagnosis of  By: Marca Ancona RMA, Lucy    . Depression   . Diabetes mellitus, type 2 ([place])    pt denies but states that she has been treated for DM  . Essential hypertension 04/08/2007   Qualifier: Diagnosis of  By: Marca Ancona RMA, Lucy    . GERD (gastroesophageal reflux disease)   . Hyperlipidemia 10/07/2017  . Lumbar disc disease 04/09/2011  . MENOPAUSE, PREMATURE 04/08/2007   Qualifier: Diagnosis of  By: Marca Ancona RMA, Lucy    . 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 Osteoporosis 04/08/2007   Qualifier: Diagnosis of  By: Reatha Armour, Lucy    . Polycythemia secondary to smoking 04/09/2011  .  Schizoaffective disorder, bipolar type ([place]) 07/05/2011    Patient Active Problem List   Diagnosis Date Noted  . Hypertensive crisis 05/01/2019  . Abdominal pain 03/15/2019  . Pleural effusion 03/13/2019  . Atelectasis 03/13/2019  . Chronic diastolic CHF (congestive heart failure) ([place]) 03/13/2019  . Type 2 diabetes mellitus without complication ([place]) [id number]  . Urinary tract bacterial infections 03/09/2019  . Acute cystitis without hematuria   . Constipation   . Acute hypoxemic respiratory failure ([place]) 12/03/2018  . Irritability 11/09/2018  . Aggressive behavior 11/09/2018  . Agitation   . S/P right hip fracture 11/07/2017  . Closed comminuted intertrochanteric fracture of right femur ([place]) 11/07/2017  . Anxiety and depression   . Dyspnea 10/16/2017  . Anemia 10/16/2017  . COPD exacerbation ([place]) 10/07/2017  . Hyperlipidemia 10/07/2017  . Acute on chronic respiratory failure with hypoxia and hypercapnia ([place]) 10/07/2017  . Incontinence of urine 08/14/2011  . Schizoaffective disorder, bipolar type ([place]) 07/05/2011  . Cough 05/08/2011  . Lumbar disc disease 04/09/2011  . Polycythemia secondary to smoking 04/09/2011  . HIP PAIN, LEFT 12/12/2007  . 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 DM (diabetes mellitus), type 2 ([place]) 04/08/2007  . MENOPAUSE, PREMATURE 04/08/2007  . Chronic hyponatremia 04/08/2007  . Essential hypertension 04/08/2007  . COPD (chronic obstructive pulmonary disease) ([place]) 04/08/2007  .  GERD 04/08/2007  . Osteoporosis 04/08/2007    Past Surgical History:  Procedure Laterality Date  . EYE SURGERY     on left  eye, pt states that she sees bad out of the right eye and needs surgery there  . INTRAMEDULLARY (IM) NAIL INTERTROCHANTERIC Right 11/13/2017   Procedure: INTRAMEDULLARY (IM) NAIL INTERTROCHANTRIC;  Surgeon: Shona Needles, MD;  Location: [facility];  Service: Orthopedics;  Laterality: Right;  . PELVIC FRACTURE SURGERY       OB History   No  obstetric history on file.     No family history on file.  Social History   Tobacco Use  . Smoking status: Current Every Day Smoker    Packs/day: 0.25    Types: Cigarettes  . Smokeless tobacco: Never Used  Substance Use Topics  . Alcohol use: No  . Drug use: No    Home Medications Prior to Admission medications   Medication Sig Start Date End Date Taking? Authorizing Provider  acetaminophen (TYLENOL) 325 MG tablet Take 325 mg by mouth every 6 (six) hours as needed for fever. Not to exceed 2000MG     [provider]  acetaminophen (TYLENOL) 500 MG tablet Take 1,000 mg by mouth every 8 (eight) hours as needed for mild pain or moderate pain. Not to exceed 3g/24h of tylenol from all sources.    [provider]  albuterol (PROVENTIL) (2.5 MG/3ML) 0.083% nebulizer solution Take 3 mLs (2.5 mg total) by nebulization every 4 (four) hours as needed for wheezing or shortness of breath. 12/11/18   Swayze, Ava, DO  Alum & Mag Hydroxide-Simeth (ANTACID LIQUID PO) Take 30 mLs by mouth every 6 (six) hours as needed (heartburn).    [provider]  Amino Acids-Protein Hydrolys (FEEDING SUPPLEMENT, PRO-STAT SUGAR FREE 64,) LIQD Take 30 mLs by mouth 2 (two) times daily. 03/14/19   Desiree Hane, MD  aspirin 81 MG chewable tablet Chew 81 mg by mouth daily.    [provider]  benzonatate (TESSALON) 100 MG capsule Take 100 mg by mouth every 8 (eight) hours as needed for cough.    [provider]  buPROPion (WELLBUTRIN XL) 150 MG 24 hr tablet Take 150 mg by mouth daily.    [provider]  Calcium Carb-Cholecalciferol (CALCIUM 600+D) 600-800 MG-UNIT TABS Take 1 tablet by mouth daily.    [provider]  Calcium Carbonate 500 MG CHEW Chew 500 mg by mouth every 12 (twelve) hours as needed (indigestion).    [provider]  citalopram (CELEXA) 20 MG tablet Take 1 tablet (20 mg total) by mouth at bedtime. 05/15/19   Hongalgi, Lenis Dickinson, MD    clonazePAM (KLONOPIN) 0.5 MG tablet Take 0.5 mg by mouth 2 (two) times daily as needed for anxiety.    [provider]  clonazePAM (KLONOPIN) 1 MG tablet Take 1 tablet (1 mg total) by mouth 2 (two) times daily. 05/15/19   Hongalgi, Lenis Dickinson, MD  demeclocycline (DECLOMYCIN) 150 MG tablet Take 150 mg by mouth 2 (two) times daily.    [provider]  docusate sodium (COLACE) 100 MG capsule Take 100 mg by mouth See admin instructions. Take one capsule (100 mg) by mouth every 12 hours, may also take one capsule (100 mg) twice daily as needed for constipation    [provider]  feeding supplement, ENSURE ENLIVE, (ENSURE ENLIVE) LIQD Take 237 mLs by mouth daily. 03/14/19   Desiree Hane, MD  fluticasone (FLONASE) 50 MCG/ACT nasal spray Place  2 sprays into both nostrils 2 (two) times daily.    [provider]  fluticasone furoate-vilanterol (BREO ELLIPTA) 100-25 MCG/INH AEPB Inhale 1 puff into the lungs daily.    [provider]  furosemide (LASIX) 20 MG tablet Take 20 mg by mouth.    [provider]  gabapentin (NEURONTIN) 100 MG capsule Take 1 capsule (100 mg total) by mouth 2 (two) times daily. 05/15/19   Hongalgi, Lenis Dickinson, MD  guaiFENesin (MUCINEX) 600 MG 12 hr tablet Take 1,200 mg by mouth daily.    [provider]  guaiFENesin (ROBITUSSIN) 100 MG/5ML liquid Take 200 mg by mouth every 8 (eight) hours as needed for cough.    [provider]  hydrOXYzine (VISTARIL) 50 MG capsule Take 50 mg by mouth 3 (three) times daily.    [provider]  ipratropium-albuterol (DUONEB) 0.5-2.5 (3) MG/3ML SOLN Take 3 mLs by nebulization 3 (three) times daily.    [provider]  lisinopril (ZESTRIL) 40 MG tablet Take 1 tablet (40 mg total) by mouth daily. 04/25/19 06/29/20  [place] Eric J, DO  loperamide (IMODIUM A-D) 2 MG tablet Take 2 mg by mouth every 6 (six) hours as needed for diarrhea or loose stools. 06/28/19   [provider]  loratadine (CLARITIN) 10 MG tablet Take 10 mg by mouth daily.    [provider]  lurasidone (LATUDA) 20 MG TABS tablet Take 1 tablet (20 mg total) by mouth 2 (two) times daily. 05/15/19   Hongalgi, Lenis Dickinson, MD  Melatonin 5 MG TABS Take 5 mg by mouth at bedtime.    [provider]  methocarbamol (ROBAXIN) 500 MG tablet Take 500 mg by mouth daily.    [provider]  metoprolol tartrate (LOPRESSOR) 25 MG tablet Take 1 tablet (25 mg total) by mouth 2 (two) times daily. 12/17/18   Kayleen Memos, DO  Multiple Vitamin (MULTIVITAMIN WITH MINERALS) TABS tablet Take 1 tablet by mouth daily. 03/15/19   Desiree Hane, MD  neomycin-bacitracin-polymyxin (NEOSPORIN) ophthalmic ointment 1 application as needed (minor skin tears or abrasions).    [provider]  nicotine (NICODERM CQ - DOSED IN MG/24 HR) 7 mg/24hr patch Place 7 mg onto the skin daily.    [provider]  NIFEdipine (PROCARDIA XL/ADALAT-CC) 90 MG 24 hr tablet Take 90 mg by mouth daily.    [provider]  nystatin (NYSTATIN) powder Apply 1 application topically 2 (two) times daily as needed (irritation). Apply topically to reddened area twice daily as needed for irritation    [provider]  OLANZapine (ZYPREXA) 5 MG tablet Take 1 tablet (5 mg total) by mouth 2 (two) times daily. Patient taking differently: Take 5 mg by mouth daily as needed (agitation).  05/15/19   Hongalgi, Lenis Dickinson, MD  OLANZapine (ZYPREXA) 5 MG tablet Take 5 mg by mouth 2 (two) times daily.     [provider]  omeprazole (PRILOSEC) 40 MG capsule Take 1 capsule (40 mg total) by mouth at bedtime. 05/15/19   Hongalgi, Lenis Dickinson, MD  ondansetron (ZOFRAN) 8 MG tablet Take 8 mg by mouth every 6 (six) hours as needed for nausea or vomiting.    [provider]  oxyCODONE (OXY IR/ROXICODONE) 5 MG immediate release tablet Take 5 mg by mouth 2 (two) times daily as needed for severe pain.     [provider]  polyethylene glycol (MIRALAX) 17 g packet Take 17 g by mouth daily. Patient taking differently: Take  17 g by mouth daily. Mix 1 capful (17 g) in 4-8 oz of water or juice and give by mouth once daily for bowel regimen 12/11/18   Swayze, Ava, DO  sodium chloride (OCEAN) 0.65 % SOLN nasal spray Place 2 sprays into both nostrils every 2 (two) hours as needed (dryness, irritation).    [provider]  sodium chloride 1 g tablet Take 1 tablet (1 g total) by mouth 3 (three) times daily with meals. Patient taking differently: Take 1 g by mouth 4 (four) times daily.  12/17/18   Kayleen Memos, DO  valproic acid (DEPAKENE) 250 MG capsule Take 2 capsules (500 mg total) by mouth 2 (two) times daily. Patient taking differently: Take 500 mg by mouth 3 (three) times daily.  12/11/18   Swayze, Ava, DO  Zinc Gluconate 100 MG TABS Take 100 mg by mouth daily.    [provider]    Allergies    Clarithromycin and Penicillins  Review of Systems   Review of Systems  Constitutional: Positive for activity change.  Respiratory: Positive for shortness of breath.   Cardiovascular: Positive for chest pain.  Gastrointestinal: Positive for abdominal pain and nausea.  Allergic/Immunologic: Negative for immunocompromised state.  Hematological: Does not bruise/bleed easily.  All other systems reviewed and are negative.   Physical Exam Updated Vital Signs BP (!) 197/60 (BP Location: Right Arm)   Pulse (!) 59   Temp 97.9 F (36.6 C) (Oral)   Resp 19   SpO2 98%   Physical Exam Vitals and nursing note reviewed.  Constitutional:      Appearance: She is well-developed. She is not toxic-appearing or diaphoretic.  HENT:     Head: Normocephalic and atraumatic.  Eyes:     Pupils: Pupils are equal, round, and reactive to light.  Cardiovascular:     Rate and Rhythm: Normal rate and regular rhythm.     Heart sounds: Normal heart sounds.  Pulmonary:     Effort: Pulmonary  effort is normal. No respiratory distress.     Breath sounds: Wheezing present. No rales.  Abdominal:     General: There is no distension.     Palpations: Abdomen is soft.     Tenderness: There is abdominal tenderness. There is no guarding or rebound.     Comments: Exam is limited due to morbid obesity  Musculoskeletal:     Cervical back: Neck supple.     Right lower leg: No edema.     Left lower leg: Edema present.  Skin:    General: Skin is warm and dry.  Neurological:     Mental Status: She is alert and oriented to person, place, and time.     ED Results / Procedures / Treatments   Labs (all labs ordered are listed, but only abnormal results are displayed) Labs Reviewed  COMPREHENSIVE METABOLIC PANEL - Abnormal; Notable for the following components:      Result Value   Sodium 118 (*)    Chloride 83 (*)    Calcium 8.4 (*)    Albumin 2.8 (*)    All other components within normal limits  D-DIMER, QUANTITATIVE (NOT AT [facility]) - Abnormal; Notable for the following components:   D-Dimer, Quant 2.57 (*)    All other components within normal limits  SARS CORONAVIRUS 2 (TAT 6-24 HRS)  CBC WITH DIFFERENTIAL/PLATELET  PROTIME-INR  LIPASE, BLOOD  BRAIN NATRIURETIC PEPTIDE  TROPONIN I (HIGH SENSITIVITY)  TROPONIN I (HIGH SENSITIVITY)    EKG EKG  Interpretation  Date/Time:  Thursday July 09 2019 06:52:06 EST Ventricular Rate:  58 PR Interval:    QRS Duration: 79 QT Interval:  435 QTC Calculation: 428 R Axis:   66 Text Interpretation: remove ekg Confirmed by Ripley Fraise [phone]) on 07/09/2019 7:02:21 AM   Radiology DG Chest Port 1 View  Result Date: 07/09/2019 CLINICAL DATA:  Nausea, hernia history EXAM: PORTABLE CHEST 1 VIEW COMPARISON:  03/13/2019 and CT abdomen and pelvis of 07/01/2019 FINDINGS: Cardiomediastinal contours remain enlarged, accentuated by portable technique. The left hemidiaphragm is obscured likely related to mixture of airspace disease and pleural  effusion. Mild increase in interstitial markings bilaterally. Signs of aortic atherosclerosis. Visualized skeletal structures are unremarkable. IMPRESSION: 1. Stable enlargement of the cardiomediastinal contours with presumed left effusion and basilar airspace disease. 2. Mild increase in interstitial markings may represent mild pulmonary edema. Electronically Signed   By: Zetta Bills M.D.   On: 07/09/2019 08:20   DG Abd Portable 1 View  Result Date: 07/09/2019 CLINICAL DATA:  Nausea, history of hernia EXAM: PORTABLE ABDOMEN - 1 VIEW COMPARISON:  CT of 07/01/2019 FINDINGS: Scattered gas-filled loops of bowel throughout the abdomen. Imaging obtained in supine position. No signs of free air or definitive signs of obstruction on plain film radiograph. Signs of bilateral sacral plasty. Post op changes of cholecystectomy and prior femoral ORIF partially imaged. No acute bone process. Signs of vascular calcification. IMPRESSION: Scattered gas-filled loops of bowel throughout the abdomen. No signs of free air or definitive signs of obstruction on supine radiography. Electronically Signed   By: Zetta Bills M.D.   On: 07/09/2019 08:22    Procedures .Critical Care Performed by: Varney Biles, MD Authorized by: Varney Biles, MD   Critical care provider statement:    Critical care time (minutes):  48   Critical care was necessary to treat or prevent imminent or life-threatening deterioration of the following conditions:  Metabolic crisis   Critical care was time spent personally by me on the following activities:  Discussions with consultants, evaluation of patient's response to treatment, examination of patient, ordering and performing treatments and interventions, ordering and review of laboratory studies, ordering and review of radiographic studies, pulse oximetry, re-evaluation of patient's condition, obtaining history from patient or surrogate and review of old charts   (including critical care  time)  Medications Ordered in ED Medications  lisinopril (ZESTRIL) tablet 40 mg (has no administration in time range)  metoprolol tartrate (LOPRESSOR) tablet 25 mg (has no administration in time range)  furosemide (LASIX) injection 20 mg (has no administration in time range)  hydrALAZINE (APRESOLINE) injection 10 mg (has no administration in time range)  ondansetron (ZOFRAN) injection 4 mg (4 mg Intravenous Given 07/09/19 0814)  morphine 4 MG/ML injection 4 mg (4 mg Intravenous Given 07/09/19 0816)  albuterol (VENTOLIN HFA) 108 (90 Base) MCG/ACT inhaler 2 puff (2 puffs Inhalation Given 07/09/19 0817)    ED Course  I have reviewed the triage vital signs and the nursing notes.  Pertinent labs & imaging results that were available during my care of the patient were reviewed by me and considered in my medical decision making (see chart for details).  Clinical Course as of Jul 09 915  Sherin Quarry  [id number] Patient sodium is 118. Likely due to CHF.  She has had history of hyponatremia in the past but this appears to be the lowest sodium.  We will give her IV Lasix.  We will admit her to medicine.  Her  nausea could be because of her low sodium.  Sodium(!!): 118 [AN]  0915 D-dimer is elevated.  Ultrasound DVT ordered.  D-Dimer, Quant(!): 2.57 [AN]  0915 Labs are reassuring otherwise.  White count is 4.1.  Patient's abdominal exam was repeated and there is no peritoneal findings.  WBC: 4.1 [AN]  0916 Patient does not have significant air-fluid levels.  Suspicion for obstruction is low for this acute discomfort.  With white count normal and no peritoneal findings I do not think she is having incarcerated hernia either.  No clear indication for a CT scan to be completed emergently.  DG Abd Portable 1 View [AN]  [id number] We will give patient oral home meds and IV hydralazine.  IV Lasix also ordered.  BP(!): 197/60 [AN]    Clinical Course User Index [AN] Varney Biles, MD   MDM  Rules/Calculators/A&P                      59 year old female comes in with multiple complaints.  Her primary complaint is abdominal pain with associated nausea and gagging.  She has history of umbilical hernia.  Pain is mostly in the epigastric and lower quadrants.  No UTI-like symptoms.  DDx includes: Pancreatitis Hepatobiliary pathology including cholecystitis Gastritis/PUD SBO ACS syndrome  Additionally she is also having epigastric chest pain, shortness of breath.  She has known history of COPD and multiple cardiovascular risk factors.  Differential includes ACS, COPD exacerbation, pulmonary edema, pneumonia.    Final Clinical Impression(s) / ED Diagnoses Final diagnoses:  Acute hyponatremia  Pulmonary edema cardiac cause ([place])  Umbilical hernia without obstruction and without gangrene    Rx / DC Orders ED Discharge Orders    None       Varney Biles, MD 07/09/19 [phone]

## 2019-07-09 NOTE — Progress Notes (Signed)
Pt admitted from the ED. Legal Guardian updated. Pt introduced to room call bell with in reach no new needs.

## 2019-07-09 NOTE — ED Notes (Signed)
Pt assisted with cleaning and changing. Pt ask for help to breath better, RN notified. Oxygen level 99% with [place] 3L/min.

## 2019-07-09 NOTE — Progress Notes (Signed)
Left lower ext venous duplex  has been completed. Refer to [facility] under chart review to view preliminary results.   07/09/2019  10:36 AM Kariya Lavergne, Bonnye Fava

## 2019-07-10 DIAGNOSIS — K429 Umbilical hernia without obstruction or gangrene: Secondary | ICD-10-CM

## 2019-07-10 DIAGNOSIS — I501 Left ventricular failure: Secondary | ICD-10-CM

## 2019-07-10 LAB — BASIC METABOLIC PANEL
Anion gap: 7 (ref 5–15)
BUN: 23 mg/dL — ABNORMAL HIGH (ref 6–20)
CO2: 28 mmol/L (ref 22–32)
Calcium: 8.3 mg/dL — ABNORMAL LOW (ref 8.9–10.3)
Chloride: 90 mmol/L — ABNORMAL LOW (ref 98–111)
Creatinine, Ser: 1.4 mg/dL — ABNORMAL HIGH (ref 0.44–1.00)
GFR calc Af Amer: 48 mL/min — ABNORMAL LOW (ref >60)
GFR calc non Af Amer: 42 mL/min — ABNORMAL LOW (ref >60)
Glucose, Bld: 132 mg/dL — ABNORMAL HIGH (ref 70–99)
Potassium: 5 mmol/L (ref 3.5–5.1)
Sodium: 125 mmol/L — ABNORMAL LOW (ref 135–145)

## 2019-07-10 LAB — CBC
HCT: 34.4 % — ABNORMAL LOW (ref 36.0–46.0)
Hemoglobin: 11.7 g/dL — ABNORMAL LOW (ref 12.0–15.0)
MCH: 30.5 pg (ref 26.0–34.0)
MCHC: 34 g/dL (ref 30.0–36.0)
MCV: 89.8 fL (ref 80.0–100.0)
Platelets: 191 10*3/uL (ref 150–400)
RBC: 3.83 MIL/uL — ABNORMAL LOW (ref 3.87–5.11)
RDW: 13.2 % (ref 11.5–15.5)
WBC: 4.4 10*3/uL (ref 4.0–10.5)
nRBC: 0 % (ref 0.0–0.2)

## 2019-07-10 MED ORDER — ENOXAPARIN SODIUM 40 MG/0.4ML ~~LOC~~ SOLN
40.0000 mg | SUBCUTANEOUS | Status: DC
  Administered 2019-07-11 – 2019-07-16 (×6): 40 mg via SUBCUTANEOUS
  Filled 2019-07-10 (×6): qty 0.4

## 2019-07-10 MED ORDER — SODIUM CHLORIDE 0.9 % IV SOLN: INTRAVENOUS | Status: DC

## 2019-07-10 MED ORDER — BISACODYL 10 MG RE SUPP
10.0000 mg | Freq: Once | RECTAL | Status: AC
  Administered 2019-07-10: 10 mg via RECTAL
  Filled 2019-07-10: qty 1

## 2019-07-10 MED ORDER — FAMOTIDINE IN NACL 20-0.9 MG/50ML-% IV SOLN
20.0000 mg | Freq: Two times a day (BID) | INTRAVENOUS | Status: DC
  Administered 2019-07-10 – 2019-07-12 (×5): 20 mg via INTRAVENOUS
  Filled 2019-07-10 (×6): qty 50

## 2019-07-10 NOTE — Progress Notes (Signed)
PROGRESS NOTE    Natalie Thomas   [id number]  DOB: [birth year]  DOA: 07/09/2019 PCP: System, Pcp Not In   Brief Narrative:  Natalie Thomas is a 59 y.o.femalewith medical history significant ofbipolar/schizoaffective disorder, HTN, HLD, DM, COPDon 2Loxygen, chronic hyponatremia anddiastolicCHF.She presents with complaints of epigastric abdominal pain. Complains of having gagging and dry heaves which further worsens her abdominal pain. Believe symptoms are related with her abdominal hernia.  In the ED the patient is found to have a sodium of 118.  Patient was last admitted from 1/5 through 1/11 for the same complaints which are abdominal pain, nausea and was found to have a sodium of 121 at that time. Takes demeclocycline for her hyponatremia.  Subjective: She states that she is having upper abdominal pain and because of it she is having difficulty breathing.  She thinks she is constipated and her last bowel movement was yesterday and soft.  She is nauseated but has not vomited.    Assessment & Plan:   Principal Problem:   Hyponatremia with suspected acute on chronic systolic heart failure -She states that she has been unable to eat and drink at home but has been able to keep her medications down -He is on Lasix and sodium chloride tablets along with demeclocycline at home - - sodium has improved to 125 -Her poor oral intake/abdominal issues are likely contributing to her chronically low sodium -I will request a nephrology consult for long-term management  Active Problems:   Chronic abdominal pain/GERD -Ongoing nausea is limiting her from eating any food today therefore I will place her on a liquid diet -Multiple CT scans have been essentially negative and I feel it is time for a GI eval -Continue Protonix-add Pepcid-consult GI  COPD (chronic obstructive pulmonary disease) ([place]) -Continue inhalers-no acute exacerbation noted  Hypertension - Continue  nifedipine, lisinopril and Lopressor    Schizoaffective disorder, bipolar type (HCC) -Continue Wellbutrin, Neurontin, Latuda, clonazepam, Celexa, Zyprexa, hydroxyzine and valproic acid    Time spent in minutes: 5 DVT prophylaxis: Lovenox Code Status: Full code Family Communication:  Disposition Plan: Continue to treat sodium and abdominal issues Consultants:   GI Procedures:   None Antimicrobials:  Anti-infectives (From admission, onward)   Start     Dose/Rate Route Frequency Ordered Stop   07/09/19 1130  demeclocycline (DECLOMYCIN) tablet 150 mg     150 mg Oral 2 times daily 07/09/19 1126         Objective: Vitals:   07/10/19 0013 07/10/19 0553 07/10/19 0847 07/10/19 1200  BP: (!) 81/60 (!) 76/52 96/60 (!) 148/85  Pulse: (!) 46 62  61  Resp:  16  14  Temp:  98.3 F (36.8 C)    TempSrc:  Oral    SpO2: 96% 99%  97%  Weight:  88.1 kg    Height:        Intake/Output Summary (Last 24 hours) at 07/10/2019 1436 Last data filed at 07/10/2019 0923 Gross per 24 hour  Intake 463 ml  Output 2100 ml  Net -1637 ml   Filed Weights   07/09/19 1804 07/10/19 0553  Weight: 88.1 kg 88.1 kg    Examination: General exam: Appears comfortable  HEENT: PERRLA, oral mucosa moist, no sclera icterus or thrush Respiratory system: Clear to auscultation. Respiratory effort normal. Cardiovascular system: S1 & S2 heard, RRR.   Gastrointestinal system: Abdomen soft, non-tender, nondistended. Normal bowel sounds. Central nervous system: Alert and oriented. No focal neurological deficits. Extremities: No cyanosis, clubbing or  edema Skin: No rashes or ulcers Psychiatry:  Mood & affect appropriate.     Data Reviewed: I have personally reviewed following labs and imaging studies  CBC: Recent Labs  Lab 07/09/19 0725 07/10/19 0259  WBC 4.1 4.4  NEUTROABS 2.5  --   HGB 12.6 11.7*  HCT 37.0 34.4*  MCV 90.2 89.8  PLT 167 [id number]   Basic Metabolic Panel: Recent Labs  Lab 07/04/19 0411  07/09/19 0725 07/09/19 1200 07/09/19 1700 07/10/19 0259  NA 130* 118* 124* 125* 125*  K 5.1 5.0 5.3* 5.3* 5.0  CL 93* 83* 87* 88* 90*  CO2 29 27 29 25 28   GLUCOSE 95 88 91 177* 132*  BUN 42* 15 13 15  23*  CREATININE 0.89 0.69 0.63 0.95 1.40*  CALCIUM 8.4* 8.4* 8.5* 8.5* 8.3*   GFR: Estimated Creatinine Clearance: 48.6 mL/min (A) (by C-G formula based on SCr of 1.4 mg/dL (H)). Liver Function Tests: Recent Labs  Lab 07/04/19 0411 07/09/19 0725  AST 8* 18  ALT 7 12  ALKPHOS 66 65  BILITOT 0.5 0.3  PROT 5.9* 6.7  ALBUMIN 2.9* 2.8*   Recent Labs  Lab 07/09/19 0725  LIPASE 26   No results for input(s): AMMONIA in the last 168 hours. Coagulation Profile: Recent Labs  Lab 07/09/19 0725  INR 0.9   Cardiac Enzymes: No results for input(s): CKTOTAL, CKMB, CKMBINDEX, TROPONINI in the last 168 hours. BNP (last 3 results) No results for input(s): PROBNP in the last 8760 hours. HbA1C: No results for input(s): HGBA1C in the last 72 hours. CBG: No results for input(s): GLUCAP in the last 168 hours. Lipid Profile: No results for input(s): CHOL, HDL, LDLCALC, TRIG, CHOLHDL, LDLDIRECT in the last 72 hours. Thyroid Function Tests: No results for input(s): TSH, T4TOTAL, FREET4, T3FREE, THYROIDAB in the last 72 hours. Anemia Panel: No results for input(s): VITAMINB12, FOLATE, FERRITIN, TIBC, IRON, RETICCTPCT in the last 72 hours. Urine analysis:    Component Value Date/Time   COLORURINE YELLOW 07/02/2019 1048   APPEARANCEUR CLEAR 07/02/2019 1048   APPEARANCEUR Clear 06/22/2012 0618   LABSPEC 1.012 07/02/2019 1048   LABSPEC 1.003 06/22/2012 0618   PHURINE 6.0 07/02/2019 1048   GLUCOSEU NEGATIVE 07/02/2019 1048   GLUCOSEU Negative 06/22/2012 0618   GLUCOSEU NEGATIVE 04/09/2011 1058   HGBUR NEGATIVE 07/02/2019 1048   BILIRUBINUR NEGATIVE 07/02/2019 1048   BILIRUBINUR Negative 06/22/2012 0618   KETONESUR NEGATIVE 07/02/2019 1048   PROTEINUR NEGATIVE 07/02/2019 1048    UROBILINOGEN 1.0 08/18/2011 1805   NITRITE NEGATIVE 07/02/2019 1048   LEUKOCYTESUR NEGATIVE 07/02/2019 1048   LEUKOCYTESUR Trace 06/22/2012 0618   Sepsis Labs: @LABRCNTIP (procalcitonin:4,lacticidven:4) ) Recent Results (from the past 240 hour(s))  SARS CORONAVIRUS 2 (TAT 6-24 HRS) Nasopharyngeal Nasopharyngeal Swab     Status: None   Collection Time: 06/30/19  8:40 PM   Specimen: Nasopharyngeal Swab  Result Value Ref Range Status   SARS Coronavirus 2 NEGATIVE NEGATIVE Final    Comment: (NOTE) SARS-CoV-2 target nucleic acids are NOT DETECTED. The SARS-CoV-2 RNA is generally detectable in upper and lower respiratory specimens during the acute phase of infection. Negative results do not preclude SARS-CoV-2 infection, do not rule out co-infections with other pathogens, and should not be used as the sole basis for treatment or other patient management decisions. Negative results must be combined with clinical observations, patient history, and epidemiological information. The expected result is Negative. Fact Sheet for Patients: SugarRoll.be Fact Sheet for Healthcare Providers: https://www.woods-mathews.com/ This test is not yet approved  or cleared by the [place] and  has been authorized for detection and/or diagnosis of SARS-CoV-2 by FDA under an Emergency Use Authorization (EUA). This EUA will remain  in effect (meaning this test can be used) for the duration of the COVID-19 declaration under Section 56 4(b)(1) of the Act, 21 U.S.C. section 360bbb-3(b)(1), unless the authorization is terminated or revoked sooner. Performed at [facility], [place] [address], [place], [place] [address]   MRSA PCR Screening     Status: None   Collection Time: 07/01/19  2:27 AM   Specimen: Nasal Mucosa; Nasopharyngeal  Result Value Ref Range Status   MRSA by PCR NEGATIVE NEGATIVE Final    Comment:        The GeneXpert MRSA Assay (FDA approved  for NASAL specimens only), is one component of a comprehensive MRSA colonization surveillance program. It is not intended to diagnose MRSA infection nor to guide or monitor treatment for MRSA infections. Performed at [facility], [place] [address], [place], [place] [address]   Culture, Urine     Status: None   Collection Time: 07/02/19 10:48 AM   Specimen: Urine, Catheterized  Result Value Ref Range Status   Specimen Description   Final    URINE, CATHETERIZED Performed at [facility] [address] [place], [place] [address]    Special Requests   Final    NONE Performed at [facility], [place] [address] [place], [place] [address]    Culture   Final    NO GROWTH Performed at [facility], [place] [address], [place], [place] [address]    Report Status 07/03/2019 FINAL  Final  SARS CORONAVIRUS 2 (TAT 6-24 HRS) Nasopharyngeal Nasopharyngeal Swab     Status: None   Collection Time: 07/09/19 10:27 AM   Specimen: Nasopharyngeal Swab  Result Value Ref Range Status   SARS Coronavirus 2 NEGATIVE NEGATIVE Final    Comment: (NOTE) SARS-CoV-2 target nucleic acids are NOT DETECTED. The SARS-CoV-2 RNA is generally detectable in upper and lower respiratory specimens during the acute phase of infection. Negative results do not preclude SARS-CoV-2 infection, do not rule out co-infections with other pathogens, and should not be used as the sole basis for treatment or other patient management decisions. Negative results must be combined with clinical observations, patient history, and epidemiological information. The expected result is Negative. Fact Sheet for Patients: SugarRoll.be Fact Sheet for Healthcare Providers: https://www.woods-mathews.com/ This test is not yet approved or cleared by the [place] FDA and  has been authorized for detection and/or diagnosis of SARS-CoV-2 by FDA  under an Emergency Use Authorization (EUA). This EUA will remain  in effect (meaning this test can be used) for the duration of the COVID-19 declaration under Section 56 4(b)(1) of the Act, 21 U.S.C. section 360bbb-3(b)(1), unless the authorization is terminated or revoked sooner. Performed at [facility], [place] [address], [place],  [address]          Radiology Studies: DG Chest Port 1 View  Result Date: 07/09/2019 CLINICAL DATA:  Nausea, hernia history EXAM: PORTABLE CHEST 1 VIEW COMPARISON:  03/13/2019 and CT abdomen and pelvis of 07/01/2019 FINDINGS: Cardiomediastinal contours remain enlarged, accentuated by portable technique. The left hemidiaphragm is obscured likely related to mixture of airspace disease and pleural effusion. Mild increase in interstitial markings bilaterally. Signs of aortic atherosclerosis. Visualized skeletal structures are unremarkable. IMPRESSION: 1. Stable enlargement of the cardiomediastinal contours with presumed left effusion  and basilar airspace disease. 2. Mild increase in interstitial markings may represent mild pulmonary edema. Electronically Signed   By: Zetta Bills M.D.   On: 07/09/2019 08:20   DG Abd Portable 1 View  Result Date: 07/09/2019 CLINICAL DATA:  Nausea, history of hernia EXAM: PORTABLE ABDOMEN - 1 VIEW COMPARISON:  CT of 07/01/2019 FINDINGS: Scattered gas-filled loops of bowel throughout the abdomen. Imaging obtained in supine position. No signs of free air or definitive signs of obstruction on plain film radiograph. Signs of bilateral sacral plasty. Post op changes of cholecystectomy and prior femoral ORIF partially imaged. No acute bone process. Signs of vascular calcification. IMPRESSION: Scattered gas-filled loops of bowel throughout the abdomen. No signs of free air or definitive signs of obstruction on supine radiography. Electronically Signed   By: Zetta Bills M.D.   On: 07/09/2019 08:22   ECHOCARDIOGRAM  COMPLETE  Result Date: 07/09/2019   ECHOCARDIOGRAM REPORT   Patient Name:   MADELLA CAMINERO Date of Exam: 07/09/2019 Medical Rec #:  [id number]          Height:       65.0 in Accession #:    [id number]         Weight:       191.8 lb Date of Birth:  [birth year]           BSA:          1.94 m Patient Age:    53 years           BP:           185/54 mmHg Patient Gender: F                  HR:           87 bpm. Exam Location:  Inpatient Procedure: 2D Echo Indications:    CHF-Acute Diastolic [id number] / [id number]  History:        Patient has prior history of Echocardiogram examinations, most                 recent 12/04/2018. COPD; Risk Factors:Dyslipidemia and Former                 Smoker. GERD>.  Sonographer:    Darlina Sicilian RDCS Referring Phys: [id number] [place]  1. Left ventricular ejection fraction, by visual estimation, is 60 to 65%. The left ventricle has normal function. Left ventricular septal wall thickness was moderately increased. Mildly increased left ventricular posterior wall thickness. There is moderately increased left ventricular hypertrophy.  2. Elevated left ventricular end-diastolic pressure.  3. Left ventricular diastolic parameters are consistent with Grade I diastolic dysfunction (impaired relaxation).  4. The left ventricle demonstrates global hypokinesis.  5. Global right ventricle has normal systolic function.The right ventricular size is normal. No increase in right ventricular wall thickness.  6. Left atrial size was severely dilated.  7. Right atrial size was normal.  8. The mitral valve is normal in structure. Trivial mitral valve regurgitation. No evidence of mitral stenosis.  9. The tricuspid valve is normal in structure. 10. The aortic valve is normal in structure. Aortic valve regurgitation is not visualized. No evidence of aortic valve sclerosis or stenosis. 11. The pulmonic valve was normal in structure. Pulmonic valve regurgitation is not visualized. 12. TR signal is  inadequate for assessing pulmonary artery systolic pressure. 13. The inferior vena cava is dilated in size with <50% respiratory variability, suggesting right atrial pressure of 15 mmHg. FINDINGS  Left Ventricle: Left ventricular ejection fraction, by visual estimation, is 60 to 65%. The left ventricle has normal function. The left ventricle demonstrates global hypokinesis. Mildly increased left ventricular posterior wall thickness. There is moderately increased left ventricular hypertrophy. Left ventricular diastolic parameters are consistent with Grade I diastolic dysfunction (impaired relaxation). Elevated left ventricular end-diastolic pressure. Right Ventricle: The right ventricular size is normal. No increase in right ventricular wall thickness. Global RV systolic function is has normal systolic function. Left Atrium: Left atrial size was severely dilated. Right Atrium: Right atrial size was normal in size Pericardium: There is no evidence of pericardial effusion. Mitral Valve: The mitral valve is normal in structure. Trivial mitral valve regurgitation. No evidence of mitral valve stenosis by observation. Tricuspid Valve: The tricuspid valve is normal in structure. Tricuspid valve regurgitation is trivial. Aortic Valve: The aortic valve is normal in structure. Aortic valve regurgitation is not visualized. The aortic valve is structurally normal, with no evidence of sclerosis or stenosis. Pulmonic Valve: The pulmonic valve was normal in structure. Pulmonic valve regurgitation is not visualized. Pulmonic regurgitation is not visualized. Aorta: The aortic root, ascending aorta and aortic arch are all structurally normal, with no evidence of dilitation or obstruction. Venous: The inferior vena cava is dilated in size with less than 50% respiratory variability, suggesting right atrial pressure of 15 mmHg. IAS/Shunts: No atrial level shunt detected by color flow Doppler. There is no evidence of a patent foramen  ovale. No ventricular septal defect is seen or detected. There is no evidence of an atrial septal defect.  LEFT VENTRICLE PLAX 2D LVIDd:         4.80 cm  Diastology LVIDs:         2.70 cm  LV e' lateral:   5.11 cm/s LV PW:         0.85 cm  LV E/e' lateral: 17.6 LV IVS:        1.55 cm  LV e' medial:    5.22 cm/s LVOT diam:     2.10 cm  LV E/e' medial:  17.2 LV SV:         81 ml LV SV Index:   39.69 LVOT Area:     3.46 cm  RIGHT VENTRICLE TAPSE (M-mode): 1.6 cm LEFT ATRIUM             Index LA diam:        4.10 cm 2.11 cm/m LA Vol (A2C):   79.9 ml 41.12 ml/m LA Vol (A4C):   80.4 ml 41.37 ml/m LA Biplane Vol: 80.2 ml 41.27 ml/m  AORTIC VALVE LVOT Vmax:   104.00 cm/s LVOT Vmean:  60.500 cm/s LVOT VTI:    0.240 m  AORTA Ao Root diam: 3.15 cm MITRAL VALVE MV Area (PHT): 3.27 cm              SHUNTS MV PHT:        67.28 msec            Systemic VTI:  0.24 m MV Decel Time: 232 msec              Systemic Diam: 2.10 cm MV E velocity: 90.00 cm/s  103 cm/s MV A velocity: 103.00 cm/s 70.3 cm/s MV E/A ratio:  0.87        1.5  Skeet Latch MD Electronically signed by Skeet Latch MD Signature Date/Time: 07/09/2019/7:13:04 PM    Final    VAS [place] LOWER EXTREMITY VENOUS (DVT) (ONLY [facility] & [facility])  Result Date: 07/09/2019  Lower Venous Study Indications: Pain, and Edema.  Comparison Study: Last LEV test done on 11/09/17, negative. Performing Technologist: Oda Cogan RDMS, RVT  Examination Guidelines: A complete evaluation includes B-mode imaging, spectral Doppler, color Doppler, and power Doppler as needed of all accessible portions of each vessel. Bilateral testing is considered an integral part of a complete examination. Limited examinations for reoccurring indications may be performed as noted.  +-----+---------------+---------+-----------+----------+--------------+ RIGHTCompressibilityPhasicitySpontaneityPropertiesThrombus Aging +-----+---------------+---------+-----------+----------+--------------+ CFV  Full            Yes      Yes                                 +-----+---------------+---------+-----------+----------+--------------+ SFJ  Full                                                        +-----+---------------+---------+-----------+----------+--------------+   +---------+---------------+---------+-----------+----------+--------------+ LEFT     CompressibilityPhasicitySpontaneityPropertiesThrombus Aging +---------+---------------+---------+-----------+----------+--------------+ CFV      Full           Yes                                          +---------+---------------+---------+-----------+----------+--------------+ SFJ      Full                                                        +---------+---------------+---------+-----------+----------+--------------+ FV Prox  Full                                                        +---------+---------------+---------+-----------+----------+--------------+ FV Mid   Full                                                        +---------+---------------+---------+-----------+----------+--------------+ FV DistalFull                                                        +---------+---------------+---------+-----------+----------+--------------+ PFV      Full                                                        +---------+---------------+---------+-----------+----------+--------------+ POP      Full           Yes      Yes                                 +---------+---------------+---------+-----------+----------+--------------+  PTV      Full                                                        +---------+---------------+---------+-----------+----------+--------------+ PERO     Full                                                        +---------+---------------+---------+-----------+----------+--------------+     Summary: Right: No evidence of common femoral vein obstruction. Left:  There is no evidence of deep vein thrombosis in the lower extremity. No cystic structure found in the popliteal fossa.  *See table(s) above for measurements and observations. Electronically signed by Servando Snare MD on 07/09/2019 at 3:32:45 PM.    Final       Scheduled Meds: . aspirin  81 mg Oral Daily  . buPROPion  150 mg Oral Daily  . citalopram  20 mg Oral QHS  . clonazePAM  1 mg Oral TID  . demeclocycline  150 mg Oral BID  . docusate sodium  100 mg Oral BID  . enoxaparin (LOVENOX) injection  40 mg Subcutaneous Q24H  . feeding supplement (ENSURE ENLIVE)  237 mL Oral Q24H  . fluticasone  2 spray Each Nare BID  . fluticasone furoate-vilanterol  1 puff Inhalation Daily  . gabapentin  100 mg Oral BID  . guaiFENesin  1,200 mg Oral Daily  . hydrOXYzine  50 mg Oral TID  . ipratropium-albuterol  3 mL Nebulization TID  . lisinopril  40 mg Oral Daily  . loratadine  10 mg Oral Daily  . lurasidone  20 mg Oral BID  . Melatonin  6 mg Oral QHS  . metoprolol tartrate  25 mg Oral BID  . nicotine  7 mg Transdermal Daily  . NIFEdipine  90 mg Oral Daily  . OLANZapine  5 mg Oral BID  . pantoprazole  40 mg Oral Daily  . polyethylene glycol  17 g Oral Daily  . sodium chloride flush  3 mL Intravenous Q12H  . sodium chloride  1 g Oral QID  . valproic acid  500 mg Oral TID   Continuous Infusions: . sodium chloride 100 mL/hr at 07/10/19 1238  . famotidine (PEPCID) IV 20 mg (07/10/19 1239)     LOS: 1 day      Debbe Odea, MD [facility] Pager: [email or web address] Password [facility] 07/10/2019, 2:36 PM

## 2019-07-10 NOTE — Progress Notes (Signed)
BP 87/63, P66, MD notified, pt asymptomatic, parameters ordered for lopressor, med held per order.

## 2019-07-10 NOTE — Social Work (Signed)
Pt from [facility] which is a [facility]. CSW called to speak with staff there. Was on hold for extended period. No answer.   Guardian called and assessment completed with her. Pt guardian is Silvano Rusk with [facility] please utilize her cell for any questions at any time: [phone].   Westley Hummer, MSW, [place] Work

## 2019-07-10 NOTE — TOC Initial Note (Signed)
Transition of Care [facility]) - Initial/Assessment Note    Patient Details  Name: Natalie Thomas MRN: [id number] Date of Birth: 1961-03-13  Transition of Care [facility]) CM/SW Contact:    Alexander Mt, [place] Phone Number:  07/10/2019, 10:49 AM  Clinical Narrative:                 CSW spoke with pt guardian [name] at [place] [phone]). Pt from [facility] where she has been living for about the past 2 years. At baseline pt uses a wheelchair and oxygen. She only really has contact with her [place]; but all decisions etc should be sent through Angie with DSS.   [facility] responded back to CSW and let this Probation officer know they cannot accept pts back on the weekend, they can take her starting Monday if she is medically stable.   [name] states that she would prefer her cell phone be contact number instead of any work after hours number- this has been indicated on her chart.   CSW/TOC team continuing to follow.   Expected Discharge Plan: Memory Care Barriers to Discharge: Continued Medical Work up   Patient Goals and CMS Choice Patient states their goals for this hospitalization and ongoing recovery are:: for her to return to [facility] [email or web address].gov Compare Post Acute Care list provided to:: (n/a) Choice offered to / list presented to : [place] / Guardian(pt guardian [name] with [facility] DSS.)  Expected Discharge Plan and Services Expected Discharge Plan: Memory Care In-house Referral: Clinical Social Work Discharge Planning Services: CM Consult Post Acute Care Choice: Resumption of Svcs/PTA Provider Living arrangements for the past 2 months: Assisted Living Facility(Memory Care)    Prior Living Arrangements/Services Living arrangements for the past 2 months: Assisted Living Facility(Memory Care) Lives with:: Facility Resident Patient language and need for interpreter reviewed:: Yes(no needs) Do you feel safe going back to the place  where you live?: Yes      Need for Family Participation in Patient Care: Yes (Comment)(assistance with care; support with decision making) Care giver support system in place?: Yes (comment)(legal guardian; aunt; facility staff) Current home services: DME Criminal Activity/Legal Involvement Pertinent to Current Situation/Hospitalization: No - Comment as needed  Activities of Daily Living Home Assistive Devices/Equipment: Wheelchair, Environmental consultant (specify type) ADL Screening (condition at time of admission) Patient's cognitive ability adequate to safely complete daily activities?: Yes Is the patient deaf or have difficulty hearing?: No Does the patient have difficulty seeing, even when wearing glasses/contacts?: No Does the patient have difficulty concentrating, remembering, or making decisions?: No Patient able to express need for assistance with ADLs?: Yes Does the patient have difficulty dressing or bathing?: Yes Independently performs ADLs?: Yes (appropriate for developmental age)(lives in assisted living) Does the patient have difficulty walking or climbing stairs?: Yes Weakness of Legs: Both Weakness of Arms/Hands: None  Permission Sought/Granted Permission sought to share information with : Guardian Permission granted to share information with : No(pt has legal guardian)  Share Information with NAME: Silvano Rusk  Permission granted to share info w AGENCY: [facility]ie Kicks; [place] granted to share info w Relationship: legal guardian  Permission granted to share info w Contact Information: [phone]  Emotional Assessment Appearance:: Other (Comment Required(telephonic assessment with guardian) Attitude/Demeanor/Rapport: (telephonic assessment with guardian) Affect (typically observed): (telephonic assessment with guardian) Orientation: : Oriented to Self, Oriented to Place, Oriented to  Time, Oriented to Situation, Fluctuating Orientation (Suspected and/or  reported Sundowners) Alcohol / Substance  Use: Not Applicable Psych Involvement: Outpatient Provider  Admission diagnosis:  Acute hyponatremia [E87.1] Hyponatremia [E87.1] Pulmonary edema cardiac cause ([place]) [id number] Umbilical hernia without obstruction and without gangrene [K42.9] Patient Active Problem List   Diagnosis Date Noted  . Hyponatremia 07/09/2019  . Acute diastolic CHF (congestive heart failure) ([place]) 07/09/2019  . Ventral hernia 07/09/2019  . Hypertensive crisis 05/01/2019  . Chronic abdominal pain 03/15/2019  . Pleural effusion 03/13/2019  . Atelectasis 03/13/2019  . Chronic diastolic CHF (congestive heart failure) ([place]) 03/13/2019  . Type 2 diabetes mellitus without complication ([place]) [id number]  . Urinary tract bacterial infections 03/09/2019  . Acute cystitis without hematuria   . Constipation   . Acute hypoxemic respiratory failure ([place]) 12/03/2018  . Irritability 11/09/2018  . Aggressive behavior 11/09/2018  . Agitation   . S/P right hip fracture 11/07/2017  . Closed comminuted intertrochanteric fracture of right femur ([place]) 11/07/2017  . Anxiety and depression   . Dyspnea 10/16/2017  . Anemia 10/16/2017  . COPD exacerbation ([place]) 10/07/2017  . Hyperlipidemia 10/07/2017  . Acute on chronic respiratory failure with hypoxia and hypercapnia ([place]) 10/07/2017  . Incontinence of urine 08/14/2011  . Schizoaffective disorder, bipolar type ([place]) 07/05/2011  . Cough 05/08/2011  . Lumbar disc disease 04/09/2011  . Polycythemia secondary to smoking 04/09/2011  . HIP PAIN, LEFT 12/12/2007  . 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 DM (diabetes mellitus), type 2 ([place]) 04/08/2007  . MENOPAUSE, PREMATURE 04/08/2007  . Chronic hyponatremia 04/08/2007  . Essential hypertension 04/08/2007  . COPD (chronic obstructive pulmonary disease) ([place]) 04/08/2007  . GERD 04/08/2007  . Osteoporosis 04/08/2007   PCP:  System, Pcp Not In Pharmacy:   [place], [address] [place] [address] Phone: [phone] Fax: [phone]   Readmission Risk Interventions Readmission Risk Prevention Plan 07/10/2019 07/01/2019 03/10/2019  Transportation Screening Complete Complete Complete  Medication Review ([place]) Referral to Pharmacy Referral to Pharmacy Complete  PCP or Specialist appointment within 3-5 days of discharge Complete Complete Complete  HRI or [place] Not Complete Not Complete Complete  [place] or Home Care Consult Pt Refusal Comments pt is resident of [facility] No orders yet, patient is from [place] Care/Counseling Consult Complete Complete Complete  Palliative Care Screening Not Applicable Not Applicable Not [place] Not Applicable Not Applicable Complete  Some recent data might be hidden

## 2019-07-10 NOTE — H&P (View-Only) (Signed)
[facility] Consult: 2:58 PM 07/10/2019  LOS: 1 day    Referring Provider: Dr Wynelle Cleveland  Primary Care Physician:  System, Pcp Not In Primary Gastroenterologist:  Unassigned.       Reason for Consultation:  Nausea abdominal pain   HPI: Natalie Thomas is a 59 y.o. female.  Hx bipolar, schizoaffective disorder.  Hypertension.  Hyperlipidemia.  Diabetes.  COPD.  Chronic hyponatremia on demeclocycline..  Diastolic CHF.  Anemia of chronic dz. status post cholecystectomy.  Previous hip surgery. Polypharmacy.   Patient resides at [facility].  CT scans (9/ Multiple admissions with plain films and CT scans (03/13/2019, 04/21/2019, 07/01/19) for recurrent complaint of abdominal pain, nausea.   CT in 02/2019 showed tiny periumbilical hernia containing fat and a loop of nondilated small bowel but findings did not explain patient's chronic abdominal pain.  On the CT in October there was an umbilical herniation of loops of small bowel but no incarceration or obstruction. recent admission of 1 5 -1/11 for the same.   Admitted 1/5 -07/06/2019 admission for abdominal pain  CTAP with contrast of 07/01/2019 showed small bilateral pleural effusions.  Mild thickening of the right wall of urinary bladder.  Aortic atherosclerosis LFTs consistently normal.  Readmitted after presenting back to the emergency room yesterday with central chest and bil upper abdominal pain for several days with shortness of breath, cough, nausea.  No emesis or diarrhea, fevers, chills. She got very somnolent from a dose of oxycodone yesterday in the ED. She says the pain gets a little better when she belches.  Has not had any observed nausea or vomiting by her current nurse.  Patient tolerated solid food this morning and there are MD bottles of  Ensure and lemonade at her bedside tray.  Last bowel movement not known.  WBCs normal.  Sodium 118.  AKI 23/1.4.  Hb 11.7. Repeatedly COVID-19 negative Portable KUB shows scattered gas-filled loops of small bowel throughout the abdomen no free air or definitive obstruction.  Patient has never had colonoscopy or EGD, nothing found in epic. Her home med lists consists of at least 30 medications. GI related meds include MiraLAX, Prilosec 40 mg a day    Past Medical History:  Diagnosis Date   Anemia 10/16/2017   Anxiety and depression    CHF (congestive heart failure) (HCC)    Chronic hyponatremia 04/08/2007   Qualifier: Diagnosis of  By: Marca Ancona RMA, Lucy     Closed comminuted intertrochanteric fracture of right femur ([place]) 11/07/2017   COPD 04/08/2007   Qualifier: Diagnosis of  By: Marca Ancona RMA, Lucy     Depression    Diabetes mellitus, type 2 ([place])    pt denies but states that she has been treated for DM   Essential hypertension 04/08/2007   Qualifier: Diagnosis of  By: Marca Ancona RMA, Lucy     GERD (gastroesophageal reflux disease)    Hyperlipidemia 10/07/2017   Lumbar disc disease 04/09/2011   MENOPAUSE, PREMATURE 04/08/2007   Qualifier: Diagnosis of  By: Marca Ancona RMA, Lavell Luster,  MALIGNANT, VULVA 04/08/2007   Qualifier: Diagnosis of  By: Marca Ancona RMA, Lucy     Osteoporosis 04/08/2007   Qualifier: Diagnosis of  By: Marca Ancona RMA, Lucy     Polycythemia secondary to smoking 04/09/2011   Schizoaffective disorder, bipolar type ([place]) 07/05/2011    Past Surgical History:  Procedure Laterality Date   EYE SURGERY     on left  eye, pt states that she sees bad out of the right eye and needs surgery there   INTRAMEDULLARY (IM) NAIL INTERTROCHANTERIC Right 11/13/2017   Procedure: INTRAMEDULLARY (IM) NAIL INTERTROCHANTRIC;  Surgeon: Shona Needles, MD;  Location: [facility];  Service: Orthopedics;  Laterality: Right;   PELVIC FRACTURE SURGERY      Prior to Admission medications     Medication Sig Start Date End Date Taking? Authorizing Provider  acetaminophen (TYLENOL) 325 MG tablet Take 325 mg by mouth every 6 (six) hours as needed for fever. Not to exceed 2000MG    Yes [provider]  acetaminophen (TYLENOL) 500 MG tablet Take 1,000 mg by mouth every 8 (eight) hours as needed for mild pain or moderate pain. Not to exceed 3g/24h of tylenol from all sources.   Yes [provider]  albuterol (PROVENTIL) (2.5 MG/3ML) 0.083% nebulizer solution Take 3 mLs (2.5 mg total) by nebulization every 4 (four) hours as needed for wheezing or shortness of breath. 12/11/18  Yes Swayze, Ava, DO  Alum & Mag Hydroxide-Simeth (ANTACID LIQUID PO) Take 30 mLs by mouth every 6 (six) hours as needed (heartburn).   Yes [provider]  aspirin 81 MG chewable tablet Chew 81 mg by mouth daily.   Yes [provider]  benzonatate (TESSALON) 100 MG capsule Take 100 mg by mouth every 8 (eight) hours as needed for cough.   Yes [provider]  buPROPion (WELLBUTRIN XL) 150 MG 24 hr tablet Take 150 mg by mouth daily.   Yes [provider]  Calcium Carb-Cholecalciferol (CALCIUM 600+D) 600-800 MG-UNIT TABS Take 1 tablet by mouth daily.   Yes [provider]  Calcium Carbonate 500 MG CHEW Chew 500 mg by mouth every 12 (twelve) hours as needed (indigestion).   Yes [provider]  citalopram (CELEXA) 20 MG tablet Take 1 tablet (20 mg total) by mouth at bedtime. 05/15/19  Yes Hongalgi, Lenis Dickinson, MD  clonazePAM (KLONOPIN) 0.5 MG tablet Take 0.5 mg by mouth 2 (two) times daily as needed for anxiety.   Yes [provider]  clonazePAM (KLONOPIN) 1 MG tablet Take 1 tablet (1 mg total) by mouth 2 (two) times daily. Patient taking differently: Take 1 mg by mouth 3 (three) times daily.  05/15/19  Yes Hongalgi, Lenis Dickinson, MD  demeclocycline (DECLOMYCIN) 150 MG tablet Take 150 mg by mouth 2 (two) times daily.   Yes [provider]   docusate sodium (COLACE) 100 MG capsule Take 100 mg by mouth See admin instructions. Take one capsule (100 mg) by mouth every 12 hours, may also take one capsule (100 mg) twice daily as needed for constipation   Yes [provider]  feeding supplement, ENSURE ENLIVE, (ENSURE ENLIVE) LIQD Take 237 mLs by mouth daily. 03/14/19  Yes Desiree Hane, MD  fluticasone (FLONASE) 50 MCG/ACT nasal spray Place 2 sprays into both nostrils 2 (two) times daily.   Yes [provider]  fluticasone furoate-vilanterol (BREO ELLIPTA) 100-25 MCG/INH AEPB Inhale 1 puff into the lungs daily.   Yes [provider]  furosemide (LASIX) 20 MG tablet Take  20 mg by mouth daily.    Yes [provider]  gabapentin (NEURONTIN) 100 MG capsule Take 1 capsule (100 mg total) by mouth 2 (two) times daily. 05/15/19  Yes Hongalgi, Lenis Dickinson, MD  guaiFENesin (MUCINEX) 600 MG 12 hr tablet Take 1,200 mg by mouth daily.   Yes [provider]  guaiFENesin (ROBITUSSIN) 100 MG/5ML liquid Take 200 mg by mouth every 8 (eight) hours as needed for cough.   Yes [provider]  hydrOXYzine (VISTARIL) 50 MG capsule Take 50 mg by mouth 3 (three) times daily.   Yes [provider]  ipratropium-albuterol (DUONEB) 0.5-2.5 (3) MG/3ML SOLN Take 3 mLs by nebulization 3 (three) times daily.   Yes [provider]  lisinopril (ZESTRIL) 40 MG tablet Take 1 tablet (40 mg total) by mouth daily. 04/25/19 06/29/20 Yes [place] Eric J, DO  loperamide (IMODIUM A-D) 2 MG tablet Take 2 mg by mouth every 6 (six) hours as needed for diarrhea or loose stools. 06/28/19  Yes [provider]  loratadine (CLARITIN) 10 MG tablet Take 10 mg by mouth daily.   Yes [provider]  lurasidone (LATUDA) 20 MG TABS tablet Take 1 tablet (20 mg total) by mouth 2 (two) times daily. 05/15/19  Yes Hongalgi, Lenis Dickinson, MD  Melatonin 5 MG TABS Take 5 mg by mouth at bedtime.   Yes [provider]   methocarbamol (ROBAXIN) 500 MG tablet Take 500 mg by mouth daily. And 1 tablet every 8 hrs as needed for spasms.   Yes [provider]  metoprolol tartrate (LOPRESSOR) 25 MG tablet Take 1 tablet (25 mg total) by mouth 2 (two) times daily. 12/17/18  Yes Kayleen Memos, DO  Multiple Vitamin (MULTIVITAMIN WITH MINERALS) TABS tablet Take 1 tablet by mouth daily. 03/15/19  Yes Oretha Milch D, MD  neomycin-bacitracin-polymyxin (NEOSPORIN) ointment Apply 1 application topically as needed for wound care.   Yes [provider]  nicotine (NICODERM CQ - DOSED IN MG/24 HR) 7 mg/24hr patch Place 7 mg onto the skin daily.   Yes [provider]  NIFEdipine (PROCARDIA XL/ADALAT-CC) 90 MG 24 hr tablet Take 90 mg by mouth daily.   Yes [provider]  nystatin (NYSTATIN) powder Apply 1 application topically 2 (two) times daily as needed (irritation). Apply topically to reddened area twice daily as needed for irritation   Yes [provider]  OLANZapine (ZYPREXA) 5 MG tablet Take 1 tablet (5 mg total) by mouth 2 (two) times daily. Patient taking differently: Take 5 mg by mouth daily as needed (agitation).  05/15/19  Yes Hongalgi, Lenis Dickinson, MD  OLANZapine (ZYPREXA) 5 MG tablet Take 5 mg by mouth 2 (two) times daily.    Yes [provider]  omeprazole (PRILOSEC) 40 MG capsule Take 1 capsule (40 mg total) by mouth at bedtime. 05/15/19  Yes Hongalgi, Lenis Dickinson, MD  ondansetron (ZOFRAN) 8 MG tablet Take 8 mg by mouth every 6 (six) hours as needed for nausea or vomiting.   Yes [provider]  oxyCODONE (OXY IR/ROXICODONE) 5 MG immediate release tablet Take 5 mg by mouth 2 (two) times daily as needed for severe pain.   Yes [provider]  polyethylene glycol (MIRALAX) 17 g packet Take 17 g by mouth daily. Patient taking differently: Take 17 g by mouth daily. Mix 1 capful (17 g) in 4-8 oz of water or juice and give by mouth once daily for bowel regimen  12/11/18  Yes Swayze, Ava, DO  sodium chloride (OCEAN) 0.65 % SOLN nasal spray Place 2 sprays into both nostrils every 2 (two) hours as needed (dryness, irritation).   Yes [provider]  sodium chloride 1 g tablet Take 1 tablet (1 g total) by mouth 3 (three) times daily with meals. Patient taking differently: Take 1 g by mouth 4 (four) times daily.  12/17/18  Yes Kayleen Memos, DO  valproic acid (DEPAKENE) 250 MG capsule Take 2 capsules (500 mg total) by mouth 2 (two) times daily. Patient taking differently: Take 500 mg by mouth 3 (three) times daily.  12/11/18  Yes Swayze, Ava, DO  Zinc Gluconate 100 MG TABS Take 100 mg by mouth daily.   Yes [provider]  Amino Acids-Protein Hydrolys (FEEDING SUPPLEMENT, PRO-STAT SUGAR FREE 64,) LIQD Take 30 mLs by mouth 2 (two) times daily. Patient not taking: Reported on 07/09/2019 03/14/19   Desiree Hane, MD    Scheduled Meds:  aspirin  81 mg Oral Daily   buPROPion  150 mg Oral Daily   citalopram  20 mg Oral QHS   clonazePAM  1 mg Oral TID   demeclocycline  150 mg Oral BID   docusate sodium  100 mg Oral BID   enoxaparin (LOVENOX) injection  40 mg Subcutaneous Q24H   feeding supplement (ENSURE ENLIVE)  237 mL Oral Q24H   fluticasone  2 spray Each Nare BID   fluticasone furoate-vilanterol  1 puff Inhalation Daily   gabapentin  100 mg Oral BID   guaiFENesin  1,200 mg Oral Daily   hydrOXYzine  50 mg Oral TID   ipratropium-albuterol  3 mL Nebulization TID   lisinopril  40 mg Oral Daily   loratadine  10 mg Oral Daily   lurasidone  20 mg Oral BID   Melatonin  6 mg Oral QHS   metoprolol tartrate  25 mg Oral BID   nicotine  7 mg Transdermal Daily   NIFEdipine  90 mg Oral Daily   OLANZapine  5 mg Oral BID   pantoprazole  40 mg Oral Daily   polyethylene glycol  17 g Oral Daily   sodium chloride flush  3 mL Intravenous Q12H   sodium chloride  1 g Oral QID   valproic acid  500 mg Oral TID    Infusions:  sodium chloride 100 mL/hr at 07/10/19 1238   famotidine (PEPCID) IV 20 mg (07/10/19 1239)   PRN Meds: acetaminophen **OR** acetaminophen, albuterol, alum & mag hydroxide-simeth, benzonatate, calcium carbonate, clonazePAM, hydrALAZINE, loperamide, neomycin-bacitracin-polymyxin, nystatin, OLANZapine, ondansetron **OR** ondansetron (ZOFRAN) IV, oxyCODONE, sodium chloride   Allergies as of 07/09/2019 - Review Complete 07/09/2019  Allergen Reaction Noted   Clarithromycin Itching 04/09/2011   Penicillins Itching     History reviewed. No pertinent family history.  Social History   Socioeconomic History   Marital status: Divorced    Spouse name: Not on file   Number of children: Not on file   Years of education: Not on file   Highest education level: Not on file  Occupational History   Occupation: Disabled  Tobacco Use   Smoking status: Current Every Day Smoker    Packs/day: 0.25    Types: Cigarettes   Smokeless tobacco: Never Used  Substance and Sexual Activity   Alcohol use: No   Drug use: No   Sexual activity: Never  Other Topics Concern   Not on file  Social History Narrative   single   Social Determinants of Health   Financial Resource Strain:  Difficulty of Paying Living Expenses: Not on file  Food Insecurity:    Worried About [place] in the Last Year: Not on file   Ran Out of Food in the Last Year: Not on file  Transportation Needs:    Lack of Transportation (Medical): Not on file   Lack of Transportation (Non-Medical): Not on file  Physical Activity:    Days of Exercise per Week: Not on file   Minutes of Exercise per Session: Not on file  Stress:    Feeling of Stress : Not on file  Social Connections:    Frequency of Communication with Friends and Family: Not on file   Frequency of Social Gatherings with Friends and Family: Not on file   Attends Religious Services: Not on file   Active Member of Clubs  or Organizations: Not on file   Attends Archivist Meetings: Not on file   Marital Status: Not on file  Intimate Partner Violence:    Fear of Current or Ex-Partner: Not on file   Emotionally Abused: Not on file   Physically Abused: Not on file   Sexually Abused: Not on file    REVIEW OF SYSTEMS: Constitutional: Feels poorly.  Fatigue. ENT:  No nose bleeds Pulm: Not currently short of breath. CV:  No palpitations, no LE edema.  GU:  No hematuria, no frequency GI: See HPI.  No bloody emesis. Heme: No unusual bleeding or bruising. Transfusions: None Neuro:  No headaches, no peripheral tingling or numbness.  No seizures. Derm:  No itching, no rash or sores.  Endocrine:  No sweats or chills.  No polyuria or dysuria Immunization: Reviewed Travel:  None   PHYSICAL EXAM: Vital signs in last 24 hours: Vitals:   07/10/19 0847 07/10/19 1200  BP: 96/60 (!) 148/85  Pulse:  61  Resp:  14  Temp:    SpO2:  97%   Wt Readings from Last 3 Encounters:  07/10/19 88.1 kg  07/06/19 87 kg  07/01/19 87 kg    General: Patient is somnolent but arousable.  Obese.  Laying in bed and looks comfortable. Head: No facial asymmetry or swelling Eyes: No conjunctival pallor Ears: Hard of hearing Nose: No discharge, no congestion. Mouth: Moist, clear, pink mucosa.  Tongue midline Neck: No JVD, no thyromegaly Lungs: Clear bilaterally.  There is a cough present and it is loose/wet Heart: RRR.  No MRG.  S1, S2 present Abdomen: Soft.  When distracted there has not much response to palpation throughout the abdomen and specifically in the upper abdomen.  When she is focused she starts to complain of pain but there is no guarding or rebound.  This is not a significant leak tender abdominal exam.  Active bowel sounds.  Not distended.   Rectal: Deferred Musc/Skeltl: No joint redness or swelling Extremities: No CCE. Neurologic: Alert.  Oriented to that she is in the "hospital".  Not able to  provide much history.  Moves all 4 limbs.  No tremors Skin: Pale, no significant lesions, rash. Tattoos: None observed Nodes: No cervical adenopathy Psych: Affect flat, cooperative.  Intake/Output from previous day: 01/14 0701 - 01/15 0700 In: 460 [P.O.:460] Out: 3475 [Urine:3475] Intake/Output this shift: Total I/O In: 3 [I.V.:3] Out: 900 [Urine:900]  LAB RESULTS: Recent Labs    07/09/19 0725 07/10/19 0259  WBC 4.1 4.4  HGB 12.6 11.7*  HCT 37.0 34.4*  PLT 167 191   BMET Lab Results  Component Value Date   NA 125 (L)  07/10/2019   NA 125 (L) 07/09/2019   NA 124 (L) 07/09/2019   K 5.0 07/10/2019   K 5.3 (H) 07/09/2019   K 5.3 (H) 07/09/2019   CL 90 (L) 07/10/2019   CL 88 (L) 07/09/2019   CL 87 (L) 07/09/2019   CO2 28 07/10/2019   CO2 25 07/09/2019   CO2 29 07/09/2019   GLUCOSE 132 (H) 07/10/2019   GLUCOSE 177 (H) 07/09/2019   GLUCOSE 91 07/09/2019   BUN 23 (H) 07/10/2019   BUN 15 07/09/2019   BUN 13 07/09/2019   CREATININE 1.40 (H) 07/10/2019   CREATININE 0.95 07/09/2019   CREATININE 0.63 07/09/2019   CALCIUM 8.3 (L) 07/10/2019   CALCIUM 8.5 (L) 07/09/2019   CALCIUM 8.5 (L) 07/09/2019   LFT Recent Labs    07/09/19 0725  PROT 6.7  ALBUMIN 2.8*  AST 18  ALT 12  ALKPHOS 65  BILITOT 0.3   PT/INR Lab Results  Component Value Date   INR 0.9 07/09/2019   INR 1.0 05/01/2019   INR 1.02 11/07/2017   Hepatitis Panel No results for input(s): HEPBSAG, HCVAB, HEPAIGM, HEPBIGM in the last 72 hours. C-Diff No components found for: CDIFF Lipase     Component Value Date/Time   LIPASE 26 07/09/2019 0725    Drugs of Abuse     Component Value Date/Time   LABOPIA NONE DETECTED 12/03/2018 1930   COCAINSCRNUR NONE DETECTED 12/03/2018 1930   LABBENZ POSITIVE (A) 12/03/2018 1930   AMPHETMU NONE DETECTED 12/03/2018 1930   THCU NONE DETECTED 12/03/2018 1930   LABBARB NONE DETECTED 12/03/2018 1930     RADIOLOGY STUDIES: DG Chest Port 1 View  Result  Date: 07/09/2019 CLINICAL DATA:  Nausea, hernia history EXAM: PORTABLE CHEST 1 VIEW COMPARISON:  03/13/2019 and CT abdomen and pelvis of 07/01/2019 FINDINGS: Cardiomediastinal contours remain enlarged, accentuated by portable technique. The left hemidiaphragm is obscured likely related to mixture of airspace disease and pleural effusion. Mild increase in interstitial markings bilaterally. Signs of aortic atherosclerosis. Visualized skeletal structures are unremarkable. IMPRESSION: 1. Stable enlargement of the cardiomediastinal contours with presumed left effusion and basilar airspace disease. 2. Mild increase in interstitial markings may represent mild pulmonary edema. Electronically Signed   By: Zetta Bills M.D.   On: 07/09/2019 08:20   DG Abd Portable 1 View  Result Date: 07/09/2019 CLINICAL DATA:  Nausea, history of hernia EXAM: PORTABLE ABDOMEN - 1 VIEW COMPARISON:  CT of 07/01/2019 FINDINGS: Scattered gas-filled loops of bowel throughout the abdomen. Imaging obtained in supine position. No signs of free air or definitive signs of obstruction on plain film radiograph. Signs of bilateral sacral plasty. Post op changes of cholecystectomy and prior femoral ORIF partially imaged. No acute bone process. Signs of vascular calcification. IMPRESSION: Scattered gas-filled loops of bowel throughout the abdomen. No signs of free air or definitive signs of obstruction on supine radiography. Electronically Signed   By: Zetta Bills M.D.   On: 07/09/2019 08:22   ECHOCARDIOGRAM COMPLETE  Result Date: 07/09/2019   ECHOCARDIOGRAM REPORT   Patient Name:   GIRLENE ZELLER Date of Exam: 07/09/2019 Medical Rec #:  [id number]          Height:       65.0 in Accession #:    [id number]         Weight:       191.8 lb Date of Birth:  06/25/1961           BSA:  1.94 m Patient Age:    19 years           BP:           185/54 mmHg Patient Gender: F                  HR:           87 bpm. Exam Location:  Inpatient  Procedure: 2D Echo Indications:    CHF-Acute Diastolic [id number] / [id number]  History:        Patient has prior history of Echocardiogram examinations, most                 recent 12/04/2018. COPD; Risk Factors:Dyslipidemia and Former                 Smoker. GERD>.  Sonographer:    Darlina Sicilian RDCS Referring Phys: [id number] [place]  1. Left ventricular ejection fraction, by visual estimation, is 60 to 65%. The left ventricle has normal function. Left ventricular septal wall thickness was moderately increased. Mildly increased left ventricular posterior wall thickness. There is moderately increased left ventricular hypertrophy.  2. Elevated left ventricular end-diastolic pressure.  3. Left ventricular diastolic parameters are consistent with Grade I diastolic dysfunction (impaired relaxation).  4. The left ventricle demonstrates global hypokinesis.  5. Global right ventricle has normal systolic function.The right ventricular size is normal. No increase in right ventricular wall thickness.  6. Left atrial size was severely dilated.  7. Right atrial size was normal.  8. The mitral valve is normal in structure. Trivial mitral valve regurgitation. No evidence of mitral stenosis.  9. The tricuspid valve is normal in structure. 10. The aortic valve is normal in structure. Aortic valve regurgitation is not visualized. No evidence of aortic valve sclerosis or stenosis. 11. The pulmonic valve was normal in structure. Pulmonic valve regurgitation is not visualized. 12. TR signal is inadequate for assessing pulmonary artery systolic pressure. 13. The inferior vena cava is dilated in size with <50% respiratory variability, suggesting right atrial pressure of 15 mmHg. FINDINGS  Left Ventricle: Left ventricular ejection fraction, by visual estimation, is 60 to 65%. The left ventricle has normal function. The left ventricle demonstrates global hypokinesis. Mildly increased left ventricular posterior wall thickness.  There is moderately increased left ventricular hypertrophy. Left ventricular diastolic parameters are consistent with Grade I diastolic dysfunction (impaired relaxation). Elevated left ventricular end-diastolic pressure. Right Ventricle: The right ventricular size is normal. No increase in right ventricular wall thickness. Global RV systolic function is has normal systolic function. Left Atrium: Left atrial size was severely dilated. Right Atrium: Right atrial size was normal in size Pericardium: There is no evidence of pericardial effusion. Mitral Valve: The mitral valve is normal in structure. Trivial mitral valve regurgitation. No evidence of mitral valve stenosis by observation. Tricuspid Valve: The tricuspid valve is normal in structure. Tricuspid valve regurgitation is trivial. Aortic Valve: The aortic valve is normal in structure. Aortic valve regurgitation is not visualized. The aortic valve is structurally normal, with no evidence of sclerosis or stenosis. Pulmonic Valve: The pulmonic valve was normal in structure. Pulmonic valve regurgitation is not visualized. Pulmonic regurgitation is not visualized. Aorta: The aortic root, ascending aorta and aortic arch are all structurally normal, with no evidence of dilitation or obstruction. Venous: The inferior vena cava is dilated in size with less than 50% respiratory variability, suggesting right atrial pressure of 15 mmHg. IAS/Shunts: No atrial level shunt detected by color flow Doppler.  There is no evidence of a patent foramen ovale. No ventricular septal defect is seen or detected. There is no evidence of an atrial septal defect.  LEFT VENTRICLE PLAX 2D LVIDd:         4.80 cm  Diastology LVIDs:         2.70 cm  LV e' lateral:   5.11 cm/s LV PW:         0.85 cm  LV E/e' lateral: 17.6 LV IVS:        1.55 cm  LV e' medial:    5.22 cm/s LVOT diam:     2.10 cm  LV E/e' medial:  17.2 LV SV:         81 ml LV SV Index:   39.69 LVOT Area:     3.46 cm  RIGHT VENTRICLE  TAPSE (M-mode): 1.6 cm LEFT ATRIUM             Index LA diam:        4.10 cm 2.11 cm/m LA Vol (A2C):   79.9 ml 41.12 ml/m LA Vol (A4C):   80.4 ml 41.37 ml/m LA Biplane Vol: 80.2 ml 41.27 ml/m  AORTIC VALVE LVOT Vmax:   104.00 cm/s LVOT Vmean:  60.500 cm/s LVOT VTI:    0.240 m  AORTA Ao Root diam: 3.15 cm MITRAL VALVE MV Area (PHT): 3.27 cm              SHUNTS MV PHT:        67.28 msec            Systemic VTI:  0.24 m MV Decel Time: 232 msec              Systemic Diam: 2.10 cm MV E velocity: 90.00 cm/s  103 cm/s MV A velocity: 103.00 cm/s 70.3 cm/s MV E/A ratio:  0.87        1.5  Skeet Latch MD Electronically signed by Skeet Latch MD Signature Date/Time: 07/09/2019/7:13:04 PM    Final    VAS [place] LOWER EXTREMITY VENOUS (DVT) (ONLY [facility] & [facility])  Result Date: 07/09/2019  Lower Venous Study Indications: Pain, and Edema.  Comparison Study: Last LEV test done on 11/09/17, negative. Performing Technologist: Oda Cogan RDMS, RVT  Examination Guidelines: A complete evaluation includes B-mode imaging, spectral Doppler, color Doppler, and power Doppler as needed of all accessible portions of each vessel. Bilateral testing is considered an integral part of a complete examination. Limited examinations for reoccurring indications may be performed as noted.  +-----+---------------+---------+-----------+----------+--------------+  RIGHT Compressibility Phasicity Spontaneity Properties Thrombus Aging  +-----+---------------+---------+-----------+----------+--------------+  CFV   Full            Yes       Yes                                    +-----+---------------+---------+-----------+----------+--------------+  SFJ   Full                                                             +-----+---------------+---------+-----------+----------+--------------+   +---------+---------------+---------+-----------+----------+--------------+  LEFT      Compressibility Phasicity Spontaneity Properties Thrombus Aging   +---------+---------------+---------+-----------+----------+--------------+  CFV       Full  Yes                                              +---------+---------------+---------+-----------+----------+--------------+  SFJ       Full                                                             +---------+---------------+---------+-----------+----------+--------------+  FV Prox   Full                                                             +---------+---------------+---------+-----------+----------+--------------+  FV Mid    Full                                                             +---------+---------------+---------+-----------+----------+--------------+  FV Distal Full                                                             +---------+---------------+---------+-----------+----------+--------------+  PFV       Full                                                             +---------+---------------+---------+-----------+----------+--------------+  POP       Full            Yes       Yes                                    +---------+---------------+---------+-----------+----------+--------------+  PTV       Full                                                             +---------+---------------+---------+-----------+----------+--------------+  PERO      Full                                                             +---------+---------------+---------+-----------+----------+--------------+     Summary: Right: No evidence of common  femoral vein obstruction. Left: There is no evidence of deep vein thrombosis in the lower extremity. No cystic structure found in the popliteal fossa.  *See table(s) above for measurements and observations. Electronically signed by Servando Snare MD on 07/09/2019 at 3:32:45 PM.    Final     IMPRESSION:   *   Chronic recurrent abdominal pain with nausea. Lab work, x-rays, CT mostly unremarkable but do show nonobstructive, small periumbilical hernia with  dilated loops of bowel. Outpatient meds include Prilosec. Previous cholecystectomy.  *     Hyponatremia, chronic..  AKI.  *      Bipolar, schizoaffective disorder.  On multiple psychotropic meds which incidentally can affect sodium    PLAN:     *     EGD tmrw Saturday, orders in [place]   [name]  07/10/2019, 2:58 PM Phone [phone]

## 2019-07-10 NOTE — Consult Note (Signed)
[facility] Consult: 2:58 PM 07/10/2019  LOS: 1 day    Referring Provider: Dr Wynelle Cleveland  Primary Care Physician:  System, Pcp Not In Primary Gastroenterologist:  Unassigned.       Reason for Consultation:  Nausea abdominal pain   HPI: Natalie Thomas is a 59 y.o. female.  Hx bipolar, schizoaffective disorder.  Hypertension.  Hyperlipidemia.  Diabetes.  COPD.  Chronic hyponatremia on demeclocycline..  Diastolic CHF.  Anemia of chronic dz. status post cholecystectomy.  Previous hip surgery. Polypharmacy.   Patient resides at [facility].  CT scans (9/ Multiple admissions with plain films and CT scans (03/13/2019, 04/21/2019, 07/01/19) for recurrent complaint of abdominal pain, nausea.   CT in 02/2019 showed tiny periumbilical hernia containing fat and a loop of nondilated small bowel but findings did not explain patient's chronic abdominal pain.  On the CT in October there was an umbilical herniation of loops of small bowel but no incarceration or obstruction. recent admission of 1 5 -1/11 for the same.   Admitted 1/5 -07/06/2019 admission for abdominal pain  CTAP with contrast of 07/01/2019 showed small bilateral pleural effusions.  Mild thickening of the right wall of urinary bladder.  Aortic atherosclerosis LFTs consistently normal.  Readmitted after presenting back to the emergency room yesterday with central chest and bil upper abdominal pain for several days with shortness of breath, cough, nausea.  No emesis or diarrhea, fevers, chills. She got very somnolent from a dose of oxycodone yesterday in the ED. She says the pain gets a little better when she belches.  Has not had any observed nausea or vomiting by her current nurse.  Patient tolerated solid food this morning and there are MD bottles of  Ensure and lemonade at her bedside tray.  Last bowel movement not known.  WBCs normal.  Sodium 118.  AKI 23/1.4.  Hb 11.7. Repeatedly COVID-19 negative Portable KUB shows scattered gas-filled loops of small bowel throughout the abdomen no free air or definitive obstruction.  Patient has never had colonoscopy or EGD, nothing found in epic. Her home med lists consists of at least 30 medications. GI related meds include MiraLAX, Prilosec 40 mg a day    Past Medical History:  Diagnosis Date   Anemia 10/16/2017   Anxiety and depression    CHF (congestive heart failure) (HCC)    Chronic hyponatremia 04/08/2007   Qualifier: Diagnosis of  By: Marca Ancona RMA, Lucy     Closed comminuted intertrochanteric fracture of right femur ([place]) 11/07/2017   COPD 04/08/2007   Qualifier: Diagnosis of  By: Marca Ancona RMA, Lucy     Depression    Diabetes mellitus, type 2 ([place])    pt denies but states that she has been treated for DM   Essential hypertension 04/08/2007   Qualifier: Diagnosis of  By: Marca Ancona RMA, Lucy     GERD (gastroesophageal reflux disease)    Hyperlipidemia 10/07/2017   Lumbar disc disease 04/09/2011   MENOPAUSE, PREMATURE 04/08/2007   Qualifier: Diagnosis of  By: Marca Ancona RMA, Lavell Luster,  MALIGNANT, VULVA 04/08/2007   Qualifier: Diagnosis of  By: Marca Ancona RMA, Lucy     Osteoporosis 04/08/2007   Qualifier: Diagnosis of  By: Marca Ancona RMA, Lucy     Polycythemia secondary to smoking 04/09/2011   Schizoaffective disorder, bipolar type ([place]) 07/05/2011    Past Surgical History:  Procedure Laterality Date   EYE SURGERY     on left  eye, pt states that she sees bad out of the right eye and needs surgery there   INTRAMEDULLARY (IM) NAIL INTERTROCHANTERIC Right 11/13/2017   Procedure: INTRAMEDULLARY (IM) NAIL INTERTROCHANTRIC;  Surgeon: Shona Needles, MD;  Location: [place];  Service: Orthopedics;  Laterality: Right;   PELVIC FRACTURE SURGERY      Prior to Admission medications     Medication Sig Start Date End Date Taking? Authorizing Provider  acetaminophen (TYLENOL) 325 MG tablet Take 325 mg by mouth every 6 (six) hours as needed for fever. Not to exceed 2000MG    Yes [provider]  acetaminophen (TYLENOL) 500 MG tablet Take 1,000 mg by mouth every 8 (eight) hours as needed for mild pain or moderate pain. Not to exceed 3g/24h of tylenol from all sources.   Yes [provider]  albuterol (PROVENTIL) (2.5 MG/3ML) 0.083% nebulizer solution Take 3 mLs (2.5 mg total) by nebulization every 4 (four) hours as needed for wheezing or shortness of breath. 12/11/18  Yes Swayze, Ava, DO  Alum & Mag Hydroxide-Simeth (ANTACID LIQUID PO) Take 30 mLs by mouth every 6 (six) hours as needed (heartburn).   Yes [provider]  aspirin 81 MG chewable tablet Chew 81 mg by mouth daily.   Yes [provider]  benzonatate (TESSALON) 100 MG capsule Take 100 mg by mouth every 8 (eight) hours as needed for cough.   Yes [provider]  buPROPion (WELLBUTRIN XL) 150 MG 24 hr tablet Take 150 mg by mouth daily.   Yes [provider]  Calcium Carb-Cholecalciferol (CALCIUM 600+D) 600-800 MG-UNIT TABS Take 1 tablet by mouth daily.   Yes [provider]  Calcium Carbonate 500 MG CHEW Chew 500 mg by mouth every 12 (twelve) hours as needed (indigestion).   Yes [provider]  citalopram (CELEXA) 20 MG tablet Take 1 tablet (20 mg total) by mouth at bedtime. 05/15/19  Yes Hongalgi, Lenis Dickinson, MD  clonazePAM (KLONOPIN) 0.5 MG tablet Take 0.5 mg by mouth 2 (two) times daily as needed for anxiety.   Yes [provider]  clonazePAM (KLONOPIN) 1 MG tablet Take 1 tablet (1 mg total) by mouth 2 (two) times daily. Patient taking differently: Take 1 mg by mouth 3 (three) times daily.  05/15/19  Yes Hongalgi, Lenis Dickinson, MD  demeclocycline (DECLOMYCIN) 150 MG tablet Take 150 mg by mouth 2 (two) times daily.   Yes [provider]   docusate sodium (COLACE) 100 MG capsule Take 100 mg by mouth See admin instructions. Take one capsule (100 mg) by mouth every 12 hours, may also take one capsule (100 mg) twice daily as needed for constipation   Yes [provider]  feeding supplement, ENSURE ENLIVE, (ENSURE ENLIVE) LIQD Take 237 mLs by mouth daily. 03/14/19  Yes Desiree Hane, MD  fluticasone (FLONASE) 50 MCG/ACT nasal spray Place 2 sprays into both nostrils 2 (two) times daily.   Yes [provider]  fluticasone furoate-vilanterol (BREO ELLIPTA) 100-25 MCG/INH AEPB Inhale 1 puff into the lungs daily.   Yes [provider]  furosemide (LASIX) 20 MG tablet Take  20 mg by mouth daily.    Yes [provider]  gabapentin (NEURONTIN) 100 MG capsule Take 1 capsule (100 mg total) by mouth 2 (two) times daily. 05/15/19  Yes Hongalgi, Lenis Dickinson, MD  guaiFENesin (MUCINEX) 600 MG 12 hr tablet Take 1,200 mg by mouth daily.   Yes [provider]  guaiFENesin (ROBITUSSIN) 100 MG/5ML liquid Take 200 mg by mouth every 8 (eight) hours as needed for cough.   Yes [provider]  hydrOXYzine (VISTARIL) 50 MG capsule Take 50 mg by mouth 3 (three) times daily.   Yes [provider]  ipratropium-albuterol (DUONEB) 0.5-2.5 (3) MG/3ML SOLN Take 3 mLs by nebulization 3 (three) times daily.   Yes [provider]  lisinopril (ZESTRIL) 40 MG tablet Take 1 tablet (40 mg total) by mouth daily. 04/25/19 06/29/20 Yes [place] Eric J, DO  loperamide (IMODIUM A-D) 2 MG tablet Take 2 mg by mouth every 6 (six) hours as needed for diarrhea or loose stools. 06/28/19  Yes [provider]  loratadine (CLARITIN) 10 MG tablet Take 10 mg by mouth daily.   Yes [provider]  lurasidone (LATUDA) 20 MG TABS tablet Take 1 tablet (20 mg total) by mouth 2 (two) times daily. 05/15/19  Yes Hongalgi, Lenis Dickinson, MD  Melatonin 5 MG TABS Take 5 mg by mouth at bedtime.   Yes [provider]   methocarbamol (ROBAXIN) 500 MG tablet Take 500 mg by mouth daily. And 1 tablet every 8 hrs as needed for spasms.   Yes [provider]  metoprolol tartrate (LOPRESSOR) 25 MG tablet Take 1 tablet (25 mg total) by mouth 2 (two) times daily. 12/17/18  Yes Kayleen Memos, DO  Multiple Vitamin (MULTIVITAMIN WITH MINERALS) TABS tablet Take 1 tablet by mouth daily. 03/15/19  Yes Oretha Milch D, MD  neomycin-bacitracin-polymyxin (NEOSPORIN) ointment Apply 1 application topically as needed for wound care.   Yes [provider]  nicotine (NICODERM CQ - DOSED IN MG/24 HR) 7 mg/24hr patch Place 7 mg onto the skin daily.   Yes [provider]  NIFEdipine (PROCARDIA XL/ADALAT-CC) 90 MG 24 hr tablet Take 90 mg by mouth daily.   Yes [provider]  nystatin (NYSTATIN) powder Apply 1 application topically 2 (two) times daily as needed (irritation). Apply topically to reddened area twice daily as needed for irritation   Yes [provider]  OLANZapine (ZYPREXA) 5 MG tablet Take 1 tablet (5 mg total) by mouth 2 (two) times daily. Patient taking differently: Take 5 mg by mouth daily as needed (agitation).  05/15/19  Yes Hongalgi, Lenis Dickinson, MD  OLANZapine (ZYPREXA) 5 MG tablet Take 5 mg by mouth 2 (two) times daily.    Yes [provider]  omeprazole (PRILOSEC) 40 MG capsule Take 1 capsule (40 mg total) by mouth at bedtime. 05/15/19  Yes Hongalgi, Lenis Dickinson, MD  ondansetron (ZOFRAN) 8 MG tablet Take 8 mg by mouth every 6 (six) hours as needed for nausea or vomiting.   Yes [provider]  oxyCODONE (OXY IR/ROXICODONE) 5 MG immediate release tablet Take 5 mg by mouth 2 (two) times daily as needed for severe pain.   Yes [provider]  polyethylene glycol (MIRALAX) 17 g packet Take 17 g by mouth daily. Patient taking differently: Take 17 g by mouth daily. Mix 1 capful (17 g) in 4-8 oz of water or juice and give by mouth once daily for bowel regimen  12/11/18  Yes Swayze, Ava, DO  sodium chloride (OCEAN) 0.65 % SOLN nasal spray Place 2 sprays into both nostrils every 2 (two) hours as needed (dryness, irritation).   Yes [provider]  sodium chloride 1 g tablet Take 1 tablet (1 g total) by mouth 3 (three) times daily with meals. Patient taking differently: Take 1 g by mouth 4 (four) times daily.  12/17/18  Yes Kayleen Memos, DO  valproic acid (DEPAKENE) 250 MG capsule Take 2 capsules (500 mg total) by mouth 2 (two) times daily. Patient taking differently: Take 500 mg by mouth 3 (three) times daily.  12/11/18  Yes Swayze, Ava, DO  Zinc Gluconate 100 MG TABS Take 100 mg by mouth daily.   Yes [provider]  Amino Acids-Protein Hydrolys (FEEDING SUPPLEMENT, PRO-STAT SUGAR FREE 64,) LIQD Take 30 mLs by mouth 2 (two) times daily. Patient not taking: Reported on 07/09/2019 03/14/19   Desiree Hane, MD    Scheduled Meds:  aspirin  81 mg Oral Daily   buPROPion  150 mg Oral Daily   citalopram  20 mg Oral QHS   clonazePAM  1 mg Oral TID   demeclocycline  150 mg Oral BID   docusate sodium  100 mg Oral BID   enoxaparin (LOVENOX) injection  40 mg Subcutaneous Q24H   feeding supplement (ENSURE ENLIVE)  237 mL Oral Q24H   fluticasone  2 spray Each Nare BID   fluticasone furoate-vilanterol  1 puff Inhalation Daily   gabapentin  100 mg Oral BID   guaiFENesin  1,200 mg Oral Daily   hydrOXYzine  50 mg Oral TID   ipratropium-albuterol  3 mL Nebulization TID   lisinopril  40 mg Oral Daily   loratadine  10 mg Oral Daily   lurasidone  20 mg Oral BID   Melatonin  6 mg Oral QHS   metoprolol tartrate  25 mg Oral BID   nicotine  7 mg Transdermal Daily   NIFEdipine  90 mg Oral Daily   OLANZapine  5 mg Oral BID   pantoprazole  40 mg Oral Daily   polyethylene glycol  17 g Oral Daily   sodium chloride flush  3 mL Intravenous Q12H   sodium chloride  1 g Oral QID   valproic acid  500 mg Oral TID    Infusions:  sodium chloride 100 mL/hr at 07/10/19 1238   famotidine (PEPCID) IV 20 mg (07/10/19 1239)   PRN Meds: acetaminophen **OR** acetaminophen, albuterol, alum & mag hydroxide-simeth, benzonatate, calcium carbonate, clonazePAM, hydrALAZINE, loperamide, neomycin-bacitracin-polymyxin, nystatin, OLANZapine, ondansetron **OR** ondansetron (ZOFRAN) IV, oxyCODONE, sodium chloride   Allergies as of 07/09/2019 - Review Complete 07/09/2019  Allergen Reaction Noted   Clarithromycin Itching 04/09/2011   Penicillins Itching     History reviewed. No pertinent family history.  Social History   Socioeconomic History   Marital status: Divorced    Spouse name: Not on file   Number of children: Not on file   Years of education: Not on file   Highest education level: Not on file  Occupational History   Occupation: Disabled  Tobacco Use   Smoking status: Current Every Day Smoker    Packs/day: 0.25    Types: Cigarettes   Smokeless tobacco: Never Used  Substance and Sexual Activity   Alcohol use: No   Drug use: No   Sexual activity: Never  Other Topics Concern   Not on file  Social History Narrative   single   Social Determinants of Health   Financial Resource Strain:  Difficulty of Paying Living Expenses: Not on file  Food Insecurity:    Worried About [place] in the Last Year: Not on file   Ran Out of Food in the Last Year: Not on file  Transportation Needs:    Lack of Transportation (Medical): Not on file   Lack of Transportation (Non-Medical): Not on file  Physical Activity:    Days of Exercise per Week: Not on file   Minutes of Exercise per Session: Not on file  Stress:    Feeling of Stress : Not on file  Social Connections:    Frequency of Communication with Friends and Family: Not on file   Frequency of Social Gatherings with Friends and Family: Not on file   Attends Religious Services: Not on file   Active Member of Clubs  or Organizations: Not on file   Attends Archivist Meetings: Not on file   Marital Status: Not on file  Intimate Partner Violence:    Fear of Current or Ex-Partner: Not on file   Emotionally Abused: Not on file   Physically Abused: Not on file   Sexually Abused: Not on file    REVIEW OF SYSTEMS: Constitutional: Feels poorly.  Fatigue. ENT:  No nose bleeds Pulm: Not currently short of breath. CV:  No palpitations, no LE edema.  GU:  No hematuria, no frequency GI: See HPI.  No bloody emesis. Heme: No unusual bleeding or bruising. Transfusions: None Neuro:  No headaches, no peripheral tingling or numbness.  No seizures. Derm:  No itching, no rash or sores.  Endocrine:  No sweats or chills.  No polyuria or dysuria Immunization: Reviewed Travel:  None   PHYSICAL EXAM: Vital signs in last 24 hours: Vitals:   07/10/19 0847 07/10/19 1200  BP: 96/60 (!) 148/85  Pulse:  61  Resp:  14  Temp:    SpO2:  97%   Wt Readings from Last 3 Encounters:  07/10/19 88.1 kg  07/06/19 87 kg  07/01/19 87 kg    General: Patient is somnolent but arousable.  Obese.  Laying in bed and looks comfortable. Head: No facial asymmetry or swelling Eyes: No conjunctival pallor Ears: Hard of hearing Nose: No discharge, no congestion. Mouth: Moist, clear, pink mucosa.  Tongue midline Neck: No JVD, no thyromegaly Lungs: Clear bilaterally.  There is a cough present and it is loose/wet Heart: RRR.  No MRG.  S1, S2 present Abdomen: Soft.  When distracted there has not much response to palpation throughout the abdomen and specifically in the upper abdomen.  When she is focused she starts to complain of pain but there is no guarding or rebound.  This is not a significant leak tender abdominal exam.  Active bowel sounds.  Not distended.   Rectal: Deferred Musc/Skeltl: No joint redness or swelling Extremities: No CCE. Neurologic: Alert.  Oriented to that she is in the "hospital".  Not able to  provide much history.  Moves all 4 limbs.  No tremors Skin: Pale, no significant lesions, rash. Tattoos: None observed Nodes: No cervical adenopathy Psych: Affect flat, cooperative.  Intake/Output from previous day: 01/14 0701 - 01/15 0700 In: 460 [P.O.:460] Out: 3475 [Urine:3475] Intake/Output this shift: Total I/O In: 3 [I.V.:3] Out: 900 [Urine:900]  LAB RESULTS: Recent Labs    07/09/19 0725 07/10/19 0259  WBC 4.1 4.4  HGB 12.6 11.7*  HCT 37.0 34.4*  PLT 167 191   BMET Lab Results  Component Value Date   NA 125 (L)  07/10/2019   NA 125 (L) 07/09/2019   NA 124 (L) 07/09/2019   K 5.0 07/10/2019   K 5.3 (H) 07/09/2019   K 5.3 (H) 07/09/2019   CL 90 (L) 07/10/2019   CL 88 (L) 07/09/2019   CL 87 (L) 07/09/2019   CO2 28 07/10/2019   CO2 25 07/09/2019   CO2 29 07/09/2019   GLUCOSE 132 (H) 07/10/2019   GLUCOSE 177 (H) 07/09/2019   GLUCOSE 91 07/09/2019   BUN 23 (H) 07/10/2019   BUN 15 07/09/2019   BUN 13 07/09/2019   CREATININE 1.40 (H) 07/10/2019   CREATININE 0.95 07/09/2019   CREATININE 0.63 07/09/2019   CALCIUM 8.3 (L) 07/10/2019   CALCIUM 8.5 (L) 07/09/2019   CALCIUM 8.5 (L) 07/09/2019   LFT Recent Labs    07/09/19 0725  PROT 6.7  ALBUMIN 2.8*  AST 18  ALT 12  ALKPHOS 65  BILITOT 0.3   PT/INR Lab Results  Component Value Date   INR 0.9 07/09/2019   INR 1.0 05/01/2019   INR 1.02 11/07/2017   Hepatitis Panel No results for input(s): HEPBSAG, HCVAB, HEPAIGM, HEPBIGM in the last 72 hours. C-Diff No components found for: CDIFF Lipase     Component Value Date/Time   LIPASE 26 07/09/2019 0725    Drugs of Abuse     Component Value Date/Time   LABOPIA NONE DETECTED 12/03/2018 1930   COCAINSCRNUR NONE DETECTED 12/03/2018 1930   LABBENZ POSITIVE (A) 12/03/2018 1930   AMPHETMU NONE DETECTED 12/03/2018 1930   THCU NONE DETECTED 12/03/2018 1930   LABBARB NONE DETECTED 12/03/2018 1930     RADIOLOGY STUDIES: DG Chest Port 1 View  Result  Date: 07/09/2019 CLINICAL DATA:  Nausea, hernia history EXAM: PORTABLE CHEST 1 VIEW COMPARISON:  03/13/2019 and CT abdomen and pelvis of 07/01/2019 FINDINGS: Cardiomediastinal contours remain enlarged, accentuated by portable technique. The left hemidiaphragm is obscured likely related to mixture of airspace disease and pleural effusion. Mild increase in interstitial markings bilaterally. Signs of aortic atherosclerosis. Visualized skeletal structures are unremarkable. IMPRESSION: 1. Stable enlargement of the cardiomediastinal contours with presumed left effusion and basilar airspace disease. 2. Mild increase in interstitial markings may represent mild pulmonary edema. Electronically Signed   By: Zetta Bills M.D.   On: 07/09/2019 08:20   DG Abd Portable 1 View  Result Date: 07/09/2019 CLINICAL DATA:  Nausea, history of hernia EXAM: PORTABLE ABDOMEN - 1 VIEW COMPARISON:  CT of 07/01/2019 FINDINGS: Scattered gas-filled loops of bowel throughout the abdomen. Imaging obtained in supine position. No signs of free air or definitive signs of obstruction on plain film radiograph. Signs of bilateral sacral plasty. Post op changes of cholecystectomy and prior femoral ORIF partially imaged. No acute bone process. Signs of vascular calcification. IMPRESSION: Scattered gas-filled loops of bowel throughout the abdomen. No signs of free air or definitive signs of obstruction on supine radiography. Electronically Signed   By: Zetta Bills M.D.   On: 07/09/2019 08:22   ECHOCARDIOGRAM COMPLETE  Result Date: 07/09/2019   ECHOCARDIOGRAM REPORT   Patient Name:   DANIJA SEDBERRY Date of Exam: 07/09/2019 Medical Rec #:  [id number]          Height:       65.0 in Accession #:    [id number]         Weight:       191.8 lb Date of Birth:  Sep 27, 1960           BSA:  1.94 m Patient Age:    39 years           BP:           185/54 mmHg Patient Gender: F                  HR:           87 bpm. Exam Location:  Inpatient  Procedure: 2D Echo Indications:    CHF-Acute Diastolic [id number] / [id number]  History:        Patient has prior history of Echocardiogram examinations, most                 recent 12/04/2018. COPD; Risk Factors:Dyslipidemia and Former                 Smoker. GERD>.  Sonographer:    Darlina Sicilian RDCS Referring Phys: [id number] [place]  1. Left ventricular ejection fraction, by visual estimation, is 60 to 65%. The left ventricle has normal function. Left ventricular septal wall thickness was moderately increased. Mildly increased left ventricular posterior wall thickness. There is moderately increased left ventricular hypertrophy.  2. Elevated left ventricular end-diastolic pressure.  3. Left ventricular diastolic parameters are consistent with Grade I diastolic dysfunction (impaired relaxation).  4. The left ventricle demonstrates global hypokinesis.  5. Global right ventricle has normal systolic function.The right ventricular size is normal. No increase in right ventricular wall thickness.  6. Left atrial size was severely dilated.  7. Right atrial size was normal.  8. The mitral valve is normal in structure. Trivial mitral valve regurgitation. No evidence of mitral stenosis.  9. The tricuspid valve is normal in structure. 10. The aortic valve is normal in structure. Aortic valve regurgitation is not visualized. No evidence of aortic valve sclerosis or stenosis. 11. The pulmonic valve was normal in structure. Pulmonic valve regurgitation is not visualized. 12. TR signal is inadequate for assessing pulmonary artery systolic pressure. 13. The inferior vena cava is dilated in size with <50% respiratory variability, suggesting right atrial pressure of 15 mmHg. FINDINGS  Left Ventricle: Left ventricular ejection fraction, by visual estimation, is 60 to 65%. The left ventricle has normal function. The left ventricle demonstrates global hypokinesis. Mildly increased left ventricular posterior wall thickness.  There is moderately increased left ventricular hypertrophy. Left ventricular diastolic parameters are consistent with Grade I diastolic dysfunction (impaired relaxation). Elevated left ventricular end-diastolic pressure. Right Ventricle: The right ventricular size is normal. No increase in right ventricular wall thickness. Global RV systolic function is has normal systolic function. Left Atrium: Left atrial size was severely dilated. Right Atrium: Right atrial size was normal in size Pericardium: There is no evidence of pericardial effusion. Mitral Valve: The mitral valve is normal in structure. Trivial mitral valve regurgitation. No evidence of mitral valve stenosis by observation. Tricuspid Valve: The tricuspid valve is normal in structure. Tricuspid valve regurgitation is trivial. Aortic Valve: The aortic valve is normal in structure. Aortic valve regurgitation is not visualized. The aortic valve is structurally normal, with no evidence of sclerosis or stenosis. Pulmonic Valve: The pulmonic valve was normal in structure. Pulmonic valve regurgitation is not visualized. Pulmonic regurgitation is not visualized. Aorta: The aortic root, ascending aorta and aortic arch are all structurally normal, with no evidence of dilitation or obstruction. Venous: The inferior vena cava is dilated in size with less than 50% respiratory variability, suggesting right atrial pressure of 15 mmHg. IAS/Shunts: No atrial level shunt detected by color flow Doppler.  There is no evidence of a patent foramen ovale. No ventricular septal defect is seen or detected. There is no evidence of an atrial septal defect.  LEFT VENTRICLE PLAX 2D LVIDd:         4.80 cm  Diastology LVIDs:         2.70 cm  LV e' lateral:   5.11 cm/s LV PW:         0.85 cm  LV E/e' lateral: 17.6 LV IVS:        1.55 cm  LV e' medial:    5.22 cm/s LVOT diam:     2.10 cm  LV E/e' medial:  17.2 LV SV:         81 ml LV SV Index:   39.69 LVOT Area:     3.46 cm  RIGHT VENTRICLE  TAPSE (M-mode): 1.6 cm LEFT ATRIUM             Index LA diam:        4.10 cm 2.11 cm/m LA Vol (A2C):   79.9 ml 41.12 ml/m LA Vol (A4C):   80.4 ml 41.37 ml/m LA Biplane Vol: 80.2 ml 41.27 ml/m  AORTIC VALVE LVOT Vmax:   104.00 cm/s LVOT Vmean:  60.500 cm/s LVOT VTI:    0.240 m  AORTA Ao Root diam: 3.15 cm MITRAL VALVE MV Area (PHT): 3.27 cm              SHUNTS MV PHT:        67.28 msec            Systemic VTI:  0.24 m MV Decel Time: 232 msec              Systemic Diam: 2.10 cm MV E velocity: 90.00 cm/s  103 cm/s MV A velocity: 103.00 cm/s 70.3 cm/s MV E/A ratio:  0.87        1.5  Skeet Latch MD Electronically signed by Skeet Latch MD Signature Date/Time: 07/09/2019/7:13:04 PM    Final    VAS [place] LOWER EXTREMITY VENOUS (DVT) (ONLY [facility] & [facility])  Result Date: 07/09/2019  Lower Venous Study Indications: Pain, and Edema.  Comparison Study: Last LEV test done on 11/09/17, negative. Performing Technologist: Oda Cogan RDMS, RVT  Examination Guidelines: A complete evaluation includes B-mode imaging, spectral Doppler, color Doppler, and power Doppler as needed of all accessible portions of each vessel. Bilateral testing is considered an integral part of a complete examination. Limited examinations for reoccurring indications may be performed as noted.  +-----+---------------+---------+-----------+----------+--------------+  RIGHT Compressibility Phasicity Spontaneity Properties Thrombus Aging  +-----+---------------+---------+-----------+----------+--------------+  CFV   Full            Yes       Yes                                    +-----+---------------+---------+-----------+----------+--------------+  SFJ   Full                                                             +-----+---------------+---------+-----------+----------+--------------+   +---------+---------------+---------+-----------+----------+--------------+  LEFT      Compressibility Phasicity Spontaneity Properties Thrombus Aging   +---------+---------------+---------+-----------+----------+--------------+  CFV       Full  Yes                                              +---------+---------------+---------+-----------+----------+--------------+  SFJ       Full                                                             +---------+---------------+---------+-----------+----------+--------------+  FV Prox   Full                                                             +---------+---------------+---------+-----------+----------+--------------+  FV Mid    Full                                                             +---------+---------------+---------+-----------+----------+--------------+  FV Distal Full                                                             +---------+---------------+---------+-----------+----------+--------------+  PFV       Full                                                             +---------+---------------+---------+-----------+----------+--------------+  POP       Full            Yes       Yes                                    +---------+---------------+---------+-----------+----------+--------------+  PTV       Full                                                             +---------+---------------+---------+-----------+----------+--------------+  PERO      Full                                                             +---------+---------------+---------+-----------+----------+--------------+     Summary: Right: No evidence of common  femoral vein obstruction. Left: There is no evidence of deep vein thrombosis in the lower extremity. No cystic structure found in the popliteal fossa.  *See table(s) above for measurements and observations. Electronically signed by Servando Snare MD on 07/09/2019 at 3:32:45 PM.    Final     IMPRESSION:   *   Chronic recurrent abdominal pain with nausea. Lab work, x-rays, CT mostly unremarkable but do show nonobstructive, small periumbilical hernia with  dilated loops of bowel. Outpatient meds include Prilosec. Previous cholecystectomy.  *     Hyponatremia, chronic..  AKI.  *      Bipolar, schizoaffective disorder.  On multiple psychotropic meds which incidentally can affect sodium    PLAN:     *     EGD tmrw Saturday, orders in depot   [name]  07/10/2019, 2:58 PM Phone [phone]

## 2019-07-10 NOTE — NC FL2 (Addendum)
[place] MEDICAID FL2 LEVEL OF CARE SCREENING TOOL     IDENTIFICATION  Patient Name: Natalie Thomas Birthdate: 1961/02/12 Sex: female Admission Date (Current Location): 07/09/2019  [place] and [place] Number:  Kathleen Argue [id number] [place] and Address:  [facility], [place] [address] [place], [place] [address]      Provider Number: [id number]  Attending Physician Name and Address:  Debbe Odea, MD  Relative Name and Phone Number:       Current Level of Care: Hospital Recommended Level of Care: [place] Prior Approval Number:    Date Approved/Denied:   PASRR Number:    Discharge Plan: Other (Comment)([place])    Current Diagnoses: Patient Active Problem List   Diagnosis Date Noted  . Hyponatremia 07/09/2019  . Acute diastolic CHF (congestive heart failure) ([place]) 07/09/2019  . Ventral hernia 07/09/2019  . Hypertensive crisis 05/01/2019  . Chronic abdominal pain 03/15/2019  . Pleural effusion 03/13/2019  . Atelectasis 03/13/2019  . Chronic diastolic CHF (congestive heart failure) ([place]) 03/13/2019  . Type 2 diabetes mellitus without complication ([place]) [id number]  . Urinary tract bacterial infections 03/09/2019  . Acute cystitis without hematuria   . Constipation   . Acute hypoxemic respiratory failure ([place]) 12/03/2018  . Irritability 11/09/2018  . Aggressive behavior 11/09/2018  . Agitation   . S/P right hip fracture 11/07/2017  . Closed comminuted intertrochanteric fracture of right femur ([place]) 11/07/2017  . Anxiety and depression   . Dyspnea 10/16/2017  . Anemia 10/16/2017  . COPD exacerbation ([place]) 10/07/2017  . Hyperlipidemia 10/07/2017  . Acute on chronic respiratory failure with hypoxia and hypercapnia ([place]) 10/07/2017  . Incontinence of urine 08/14/2011  . Schizoaffective disorder, bipolar type ([place]) 07/05/2011  . Cough 05/08/2011  . Lumbar disc disease 04/09/2011  . Polycythemia secondary to smoking  04/09/2011  . HIP PAIN, LEFT 12/12/2007  . 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 DM (diabetes mellitus), type 2 ([place]) 04/08/2007  . MENOPAUSE, PREMATURE 04/08/2007  . Chronic hyponatremia 04/08/2007  . Essential hypertension 04/08/2007  . COPD (chronic obstructive pulmonary disease) ([place]) 04/08/2007  . GERD 04/08/2007  . Osteoporosis 04/08/2007    Orientation RESPIRATION BLADDER Height & Weight     Self, Time, Situation, Place  Normal Continent Weight: 194 lb 3.6 oz (88.1 kg) Height:  5\' 5"  (165.1 cm)  BEHAVIORAL SYMPTOMS/MOOD NEUROLOGICAL BOWEL NUTRITION STATUS      Continent Diet  AMBULATORY STATUS COMMUNICATION OF NEEDS Skin   Extensive Assist Verbally Other (Comment)(MASD on abdomen)                       Personal Care Assistance Level of Assistance  Bathing, Feeding, Dressing Bathing Assistance: Maximum assistance Feeding assistance: Independent Dressing Assistance: Maximum assistance     Functional Limitations Info  Hearing, Sight, Speech Sight Info: Adequate Hearing Info: Adequate Speech Info: Adequate    SPECIAL CARE FACTORS FREQUENCY                       Contractures Contractures Info: Not present    Additional Factors Info  Code Status, Allergies, Psychotropic Code Status Info: Full Code Allergies Info: Clarithromycin, Penicillins Psychotropic Info: buPROPion (WELLBUTRIN XL) 24 hr tablet 150 mg daily PO; citalopram (CELEXA) tablet 20 mg daily at bedtime PO; clonazePAM (KLONOPIN) tablet 1 mg 3x daily PO; lurasidone (LATUDA) tablet 20 mg 2x daily PO; OLANZapine (ZYPREXA) tablet 5 mg 2x daily PO  Current Medications (07/10/2019):  This is the current hospital active medication list Current Facility-Administered Medications  Medication Dose Route Frequency Provider Last Rate Last Admin  . 0.9 %  sodium chloride infusion   Intravenous Continuous Debbe Odea, MD 100 mL/hr at 07/10/19 1500 Rate Verify at 07/10/19 1500  . acetaminophen  (TYLENOL) tablet 650 mg  650 mg Oral Q6H PRN Fuller Plan A, MD   650 mg at 07/10/19 1247   Or  . acetaminophen (TYLENOL) suppository 650 mg  650 mg Rectal Q6H PRN Smith, Rondell A, MD      . albuterol (PROVENTIL) (2.5 MG/3ML) 0.083% nebulizer solution 2.5 mg  2.5 mg Nebulization Q4H PRN Smith, Rondell A, MD      . alum & mag hydroxide-simeth (MAALOX/MYLANTA) 200-200-20 MG/5ML suspension 15 mL  15 mL Oral Q6H PRN Fuller Plan A, MD   15 mL at 07/10/19 1247  . aspirin chewable tablet 81 mg  81 mg Oral Daily Fuller Plan A, MD   81 mg at 07/10/19 0914  . benzonatate (TESSALON) capsule 100 mg  100 mg Oral Q8H PRN Tamala Julian, Rondell A, MD      . buPROPion (WELLBUTRIN XL) 24 hr tablet 150 mg  150 mg Oral Daily Tamala Julian, Rondell A, MD   150 mg at 07/10/19 0916  . calcium carbonate (TUMS - dosed in mg elemental calcium) chewable tablet 500 mg  500 mg Oral Q12H PRN Smith, Rondell A, MD      . citalopram (CELEXA) tablet 20 mg  20 mg Oral QHS Smith, Rondell A, MD   20 mg at 07/09/19 2152  . clonazePAM (KLONOPIN) tablet 0.5 mg  0.5 mg Oral BID PRN Fuller Plan A, MD      . clonazePAM (KLONOPIN) tablet 1 mg  1 mg Oral TID Fuller Plan A, MD   1 mg at 07/10/19 1541  . demeclocycline (DECLOMYCIN) tablet 150 mg  150 mg Oral BID Fuller Plan A, MD   150 mg at 07/10/19 [id number]  . docusate sodium (COLACE) capsule 100 mg  100 mg Oral BID Fuller Plan A, MD   100 mg at 07/10/19 0915  . [START ON 07/11/2019] enoxaparin (LOVENOX) injection 40 mg  40 mg Subcutaneous Q24H Gribbin, Sarah J, PA-C      . famotidine (PEPCID) IVPB 20 mg premix  20 mg Intravenous Q12H Debbe Odea, MD   Stopped at 07/10/19 1309  . feeding supplement (ENSURE ENLIVE) (ENSURE ENLIVE) liquid 237 mL  237 mL Oral Q24H Smith, Rondell A, MD   237 mL at 07/10/19 1214  . fluticasone (FLONASE) 50 MCG/ACT nasal spray 2 spray  2 spray Each Nare BID Norval Morton, MD   2 spray at 07/10/19 0920  . fluticasone furoate-vilanterol (BREO ELLIPTA) 100-25  MCG/INH 1 puff  1 puff Inhalation Daily Fuller Plan A, MD   1 puff at 07/10/19 0923  . gabapentin (NEURONTIN) capsule 100 mg  100 mg Oral BID Fuller Plan A, MD   100 mg at 07/10/19 0914  . guaiFENesin (MUCINEX) 12 hr tablet 1,200 mg  1,200 mg Oral Daily Smith, Rondell A, MD   1,200 mg at 07/10/19 0916  . hydrALAZINE (APRESOLINE) injection 10 mg  10 mg Intravenous Q4H PRN Fuller Plan A, MD      . hydrOXYzine (ATARAX/VISTARIL) tablet 50 mg  50 mg Oral TID Fuller Plan A, MD   50 mg at 07/10/19 1555  . ipratropium-albuterol (DUONEB) 0.5-2.5 (3) MG/3ML nebulizer solution 3 mL  3 mL Nebulization TID  Fuller Plan A, MD   3 mL at 07/10/19 1541  . lisinopril (ZESTRIL) tablet 40 mg  40 mg Oral Daily Varney Biles, MD   Stopped at 07/10/19 0851  . loperamide (IMODIUM) capsule 2 mg  2 mg Oral Q6H PRN Fuller Plan A, MD      . loratadine (CLARITIN) tablet 10 mg  10 mg Oral Daily Tamala Julian, Rondell A, MD   10 mg at 07/10/19 0915  . lurasidone (LATUDA) tablet 20 mg  20 mg Oral BID Fuller Plan A, MD   20 mg at 07/10/19 0920  . Melatonin TABS 6 mg  6 mg Oral QHS Fuller Plan A, MD   6 mg at 07/09/19 2155  . metoprolol tartrate (LOPRESSOR) tablet 25 mg  25 mg Oral BID Neila Gear, NP   Stopped at 07/10/19 [phone]  . neomycin-bacitracin-polymyxin (NEOSPORIN) ointment packet 1 application  1 application Topical PRN Smith, Rondell A, MD      . nicotine (NICODERM CQ - dosed in mg/24 hr) patch 7 mg  7 mg Transdermal Daily Tamala Julian, Rondell A, MD   7 mg at 07/10/19 0913  . NIFEdipine (PROCARDIA XL/NIFEDICAL-XL) 24 hr tablet 90 mg  90 mg Oral Daily Fuller Plan A, MD   90 mg at 07/10/19 0921  . nystatin (MYCOSTATIN/NYSTOP) topical powder 1 application  1 application Topical BID PRN Fuller Plan A, MD      . OLANZapine (ZYPREXA) tablet 5 mg  5 mg Oral BID Fuller Plan A, MD   5 mg at 07/10/19 0921  . OLANZapine (ZYPREXA) tablet 5 mg  5 mg Oral Daily PRN Fuller Plan A, MD      . ondansetron (ZOFRAN)  tablet 4 mg  4 mg Oral Q6H PRN Debbe Odea, MD       Or  . ondansetron (ZOFRAN) injection 4 mg  4 mg Intravenous Q6H PRN Debbe Odea, MD   4 mg at 07/10/19 0908  . oxyCODONE (Oxy IR/ROXICODONE) immediate release tablet 5 mg  5 mg Oral BID PRN Fuller Plan A, MD   5 mg at 07/09/19 1941  . pantoprazole (PROTONIX) EC tablet 40 mg  40 mg Oral Daily Fuller Plan A, MD   40 mg at 07/10/19 0916  . polyethylene glycol (MIRALAX / GLYCOLAX) packet 17 g  17 g Oral Daily Tamala Julian, Rondell A, MD   17 g at 07/10/19 1247  . sodium chloride (OCEAN) 0.65 % nasal spray 2 spray  2 spray Each Nare Q2H PRN Smith, Rondell A, MD      . sodium chloride flush (NS) 0.9 % injection 3 mL  3 mL Intravenous Q12H Smith, Rondell A, MD   3 mL at 07/10/19 0923  . sodium chloride tablet 1 g  1 g Oral QID Fuller Plan A, MD   1 g at 07/10/19 1541  . valproic acid (DEPAKENE) 250 MG capsule 500 mg  500 mg Oral TID Fuller Plan A, MD   500 mg at 07/10/19 1555     Discharge Medications: Please see discharge summary for a list of discharge medications.  Relevant Imaging Results:  Relevant Lab Results:   Additional Information SS# [id number]  [place], [place]

## 2019-07-11 ENCOUNTER — Encounter (HOSPITAL_COMMUNITY)
Admission: EM | Disposition: A | Discharge status: Skilled Nursing Facility | Payer: Self-pay | Source: Home / Self Care | Attending: Internal Medicine

## 2019-07-11 ENCOUNTER — Inpatient Hospital Stay (HOSPITAL_COMMUNITY): Payer: Medicare Other | Admitting: Anesthesiology

## 2019-07-11 ENCOUNTER — Encounter (HOSPITAL_COMMUNITY): Payer: Self-pay | Admitting: Internal Medicine

## 2019-07-11 DIAGNOSIS — K2900 Acute gastritis without bleeding: Secondary | ICD-10-CM

## 2019-07-11 DIAGNOSIS — G8929 Other chronic pain: Secondary | ICD-10-CM | POA: Diagnosis present

## 2019-07-11 DIAGNOSIS — K2970 Gastritis, unspecified, without bleeding: Secondary | ICD-10-CM

## 2019-07-11 DIAGNOSIS — K449 Diaphragmatic hernia without obstruction or gangrene: Secondary | ICD-10-CM

## 2019-07-11 HISTORY — PX: ESOPHAGOGASTRODUODENOSCOPY (EGD) WITH PROPOFOL: SHX5813

## 2019-07-11 HISTORY — PX: BIOPSY: SHX5522

## 2019-07-11 SURGERY — ESOPHAGOGASTRODUODENOSCOPY (EGD) WITH PROPOFOL
Anesthesia: Monitor Anesthesia Care

## 2019-07-11 MED ORDER — PROPOFOL 10 MG/ML IV BOLUS
INTRAVENOUS | Status: DC | PRN
  Administered 2019-07-11: 20 mg via INTRAVENOUS

## 2019-07-11 MED ORDER — ONDANSETRON HCL 4 MG/2ML IJ SOLN
INTRAMUSCULAR | Status: AC
  Filled 2019-07-11: qty 2

## 2019-07-11 MED ORDER — HYDRALAZINE HCL 20 MG/ML IJ SOLN
INTRAMUSCULAR | Status: AC
  Filled 2019-07-11: qty 1

## 2019-07-11 MED ORDER — AMLODIPINE BESYLATE 5 MG PO TABS: 5.0000 mg | ORAL_TABLET | Freq: Two times a day (BID) | ORAL | Status: DC

## 2019-07-11 MED ORDER — PROPOFOL 500 MG/50ML IV EMUL
INTRAVENOUS | Status: DC | PRN
  Administered 2019-07-11: 100 ug/kg/min via INTRAVENOUS

## 2019-07-11 MED ORDER — HYDRALAZINE HCL 25 MG PO TABS
25.0000 mg | ORAL_TABLET | Freq: Three times a day (TID) | ORAL | Status: DC
  Administered 2019-07-11 – 2019-07-17 (×17): 25 mg via ORAL
  Filled 2019-07-11 (×18): qty 1

## 2019-07-11 MED ORDER — LACTATED RINGERS IV SOLN: INTRAVENOUS | Status: DC | PRN

## 2019-07-11 SURGICAL SUPPLY — 15 items

## 2019-07-11 NOTE — Transfer of Care (Signed)
Immediate Anesthesia Transfer of Care Note  Patient: Natalie Thomas  Procedure(s) Performed: ESOPHAGOGASTRODUODENOSCOPY (EGD) WITH PROPOFOL (N/A ) BIOPSY  Patient Location: PACU and Endoscopy Unit  Anesthesia Type:MAC  Level of Consciousness: awake, alert  and oriented  Airway & Oxygen Therapy: Patient Spontanous Breathing  Post-op Assessment: Report given to RN and Post -op Vital signs reviewed and stable  Post vital signs: Reviewed and stable  Last Vitals:  Vitals Value Taken Time  BP 189/42 07/11/19 1412  Temp 36.6 C 07/11/19 1412  Pulse 65 07/11/19 1414  Resp 15 07/11/19 1414  SpO2 98 % 07/11/19 1414  Vitals shown include unvalidated device data.  Last Pain:  Vitals:   07/11/19 1412  TempSrc: Oral  PainSc: 8          Complications: No apparent anesthesia complications

## 2019-07-11 NOTE — Progress Notes (Addendum)
RN spoke to ENDO staff, Pt's unable to consent for her procedure d/t her cognition and mental capacity.  RN called Trinna Post  Juchat at [phone] to consent but no answer. Will attempt at a later time. Pt has been NPO past MN.   Per note, she is bedbound and require O2 at 2L [place]. VSS.   Ave Filter, RN

## 2019-07-11 NOTE — Consult Note (Signed)
Renal Service Consult Note [facility]  Natalie Thomas 07/11/2019 Sol Blazing Requesting Physician:  Dr Wynelle Cleveland  Reason for Consult:  Hyponatremia HPI: The patient is a 59 y.o. year-old with hx of anxiety/ depression, bipolar and schizaffective disorders, HL, HTN and COPD admitted on 1/14 w/ abd pain and nausea/ dry heaves. In ED labs showed normal VS, Na 118, neg trop and CXR showing CM w/ questionable IS edema.  Pt was admitted and rec'd fluid restriction, cont'd home salt tabs / demeclo. BP's were high and home meds were cont'd including acei. Due to concern for CHF pt given IV lasix 20mg  on 1/14.  The labs from later in the day and on 1/15 showed improved Na to 124-125.  Creat was normal on admit but bumped to 1.4 yesterday. Lasix was held today. No labs today. Asked to see for AKI and hyponatremia.   Patient says has been taking lasix "for years" for leg swelling. No c/o SOB or hx of heart problems. Long hx recurrent low Na's, lowest was 114.  States 'I crave ice and water".    ROS  denies CP  no joint pain   no HA  no blurry vision  no rash  no diarrhea  no nausea/ vomiting  no dysuria  no difficulty voiding  no change in urine color    Past Medical History  Past Medical History:  Diagnosis Date  . Anemia 10/16/2017  . Anxiety and depression   . CHF (congestive heart failure) ([place])   . Chronic hyponatremia 04/08/2007   Qualifier: Diagnosis of  By: Marca Ancona RMA, Lucy    . Closed comminuted intertrochanteric fracture of right femur ([place]) 11/07/2017  . COPD 04/08/2007   Qualifier: Diagnosis of  By: Marca Ancona RMA, Lucy    . Depression   . Diabetes mellitus, type 2 ([place])    pt denies but states that she has been treated for DM  . Essential hypertension 04/08/2007   Qualifier: Diagnosis of  By: Marca Ancona RMA, Lucy    . GERD (gastroesophageal reflux disease)   . Hyperlipidemia 10/07/2017  . Lumbar disc disease 04/09/2011  . MENOPAUSE, PREMATURE 04/08/2007    Qualifier: Diagnosis of  By: Marca Ancona RMA, Lucy    . 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 Osteoporosis 04/08/2007   Qualifier: Diagnosis of  By: Reatha Armour, Lucy    . Polycythemia secondary to smoking 04/09/2011  . Schizoaffective disorder, bipolar type ([place]) 07/05/2011   Past Surgical History  Past Surgical History:  Procedure Laterality Date  . EYE SURGERY     on left  eye, pt states that she sees bad out of the right eye and needs surgery there  . INTRAMEDULLARY (IM) NAIL INTERTROCHANTERIC Right 11/13/2017   Procedure: INTRAMEDULLARY (IM) NAIL INTERTROCHANTRIC;  Surgeon: Shona Needles, MD;  Location: [place];  Service: Orthopedics;  Laterality: Right;  . PELVIC FRACTURE SURGERY     Family History History reviewed. No pertinent family history. Social History  reports that she has been smoking cigarettes. She has been smoking about 0.25 packs per day. She has never used smokeless tobacco. She reports that she does not drink alcohol or use drugs. Allergies  Allergies  Allergen Reactions  . Clarithromycin Itching  . Penicillins Itching    Has tolerated cefazolin before  Has patient had a PCN reaction causing immediate rash, facial/tongue/throat swelling, SOB or lightheadedness with hypotension: No Has patient had a PCN reaction  causing severe rash involving mucus membranes or skin necrosis: No Has patient had a PCN reaction that required hospitalization: Unknown Has patient had a PCN reaction occurring within the last 10 years: No If all of the above answers are "NO", then may proceed with Cephalosporin use.    Home medications Prior to Admission medications   Medication Sig Start Date End Date Taking? Authorizing Provider  acetaminophen (TYLENOL) 325 MG tablet Take 325 mg by mouth every 6 (six) hours as needed for fever. Not to exceed 2000MG    Yes [provider]  acetaminophen (TYLENOL) 500 MG tablet Take 1,000 mg by mouth  every 8 (eight) hours as needed for mild pain or moderate pain. Not to exceed 3g/24h of tylenol from all sources.   Yes [provider]  albuterol (PROVENTIL) (2.5 MG/3ML) 0.083% nebulizer solution Take 3 mLs (2.5 mg total) by nebulization every 4 (four) hours as needed for wheezing or shortness of breath. 12/11/18  Yes Swayze, Ava, DO  Alum & Mag Hydroxide-Simeth (ANTACID LIQUID PO) Take 30 mLs by mouth every 6 (six) hours as needed (heartburn).   Yes [provider]  aspirin 81 MG chewable tablet Chew 81 mg by mouth daily.   Yes [provider]  benzonatate (TESSALON) 100 MG capsule Take 100 mg by mouth every 8 (eight) hours as needed for cough.   Yes [provider]  buPROPion (WELLBUTRIN XL) 150 MG 24 hr tablet Take 150 mg by mouth daily.   Yes [provider]  Calcium Carb-Cholecalciferol (CALCIUM 600+D) 600-800 MG-UNIT TABS Take 1 tablet by mouth daily.   Yes [provider]  Calcium Carbonate 500 MG CHEW Chew 500 mg by mouth every 12 (twelve) hours as needed (indigestion).   Yes [provider]  citalopram (CELEXA) 20 MG tablet Take 1 tablet (20 mg total) by mouth at bedtime. 05/15/19  Yes Hongalgi, Lenis Dickinson, MD  clonazePAM (KLONOPIN) 0.5 MG tablet Take 0.5 mg by mouth 2 (two) times daily as needed for anxiety.   Yes [provider]  clonazePAM (KLONOPIN) 1 MG tablet Take 1 tablet (1 mg total) by mouth 2 (two) times daily. Patient taking differently: Take 1 mg by mouth 3 (three) times daily.  05/15/19  Yes Hongalgi, Lenis Dickinson, MD  demeclocycline (DECLOMYCIN) 150 MG tablet Take 150 mg by mouth 2 (two) times daily.   Yes [provider]  docusate sodium (COLACE) 100 MG capsule Take 100 mg by mouth See admin instructions. Take one capsule (100 mg) by mouth every 12 hours, may also take one capsule (100 mg) twice daily as needed for constipation   Yes [provider]  feeding supplement, ENSURE ENLIVE, (ENSURE  ENLIVE) LIQD Take 237 mLs by mouth daily. 03/14/19  Yes Desiree Hane, MD  fluticasone (FLONASE) 50 MCG/ACT nasal spray Place 2 sprays into both nostrils 2 (two) times daily.   Yes [provider]  fluticasone furoate-vilanterol (BREO ELLIPTA) 100-25 MCG/INH AEPB Inhale 1 puff into the lungs daily.   Yes [provider]  furosemide (LASIX) 20 MG tablet Take 20 mg by mouth daily.    Yes [provider]  gabapentin (NEURONTIN) 100 MG capsule Take 1 capsule (100 mg total) by mouth 2 (two) times daily. 05/15/19  Yes Hongalgi, Lenis Dickinson, MD  guaiFENesin (MUCINEX) 600 MG 12 hr tablet Take 1,200 mg by mouth daily.   Yes [provider]  guaiFENesin (ROBITUSSIN) 100 MG/5ML liquid Take 200 mg by mouth every 8 (eight) hours as  needed for cough.   Yes [provider]  hydrOXYzine (VISTARIL) 50 MG capsule Take 50 mg by mouth 3 (three) times daily.   Yes [provider]  ipratropium-albuterol (DUONEB) 0.5-2.5 (3) MG/3ML SOLN Take 3 mLs by nebulization 3 (three) times daily.   Yes [provider]  lisinopril (ZESTRIL) 40 MG tablet Take 1 tablet (40 mg total) by mouth daily. 04/25/19 06/29/20 Yes [place] Eric J, DO  loperamide (IMODIUM A-D) 2 MG tablet Take 2 mg by mouth every 6 (six) hours as needed for diarrhea or loose stools. 06/28/19  Yes [provider]  loratadine (CLARITIN) 10 MG tablet Take 10 mg by mouth daily.   Yes [provider]  lurasidone (LATUDA) 20 MG TABS tablet Take 1 tablet (20 mg total) by mouth 2 (two) times daily. 05/15/19  Yes Hongalgi, Lenis Dickinson, MD  Melatonin 5 MG TABS Take 5 mg by mouth at bedtime.   Yes [provider]  methocarbamol (ROBAXIN) 500 MG tablet Take 500 mg by mouth daily. And 1 tablet every 8 hrs as needed for spasms.   Yes [provider]  metoprolol tartrate (LOPRESSOR) 25 MG tablet Take 1 tablet (25 mg total) by mouth 2 (two) times daily. 12/17/18  Yes Kayleen Memos, DO   Multiple Vitamin (MULTIVITAMIN WITH MINERALS) TABS tablet Take 1 tablet by mouth daily. 03/15/19  Yes Oretha Milch D, MD  neomycin-bacitracin-polymyxin (NEOSPORIN) ointment Apply 1 application topically as needed for wound care.   Yes [provider]  nicotine (NICODERM CQ - DOSED IN MG/24 HR) 7 mg/24hr patch Place 7 mg onto the skin daily.   Yes [provider]  NIFEdipine (PROCARDIA XL/ADALAT-CC) 90 MG 24 hr tablet Take 90 mg by mouth daily.   Yes [provider]  nystatin (NYSTATIN) powder Apply 1 application topically 2 (two) times daily as needed (irritation). Apply topically to reddened area twice daily as needed for irritation   Yes [provider]  OLANZapine (ZYPREXA) 5 MG tablet Take 1 tablet (5 mg total) by mouth 2 (two) times daily. Patient taking differently: Take 5 mg by mouth daily as needed (agitation).  05/15/19  Yes Hongalgi, Lenis Dickinson, MD  OLANZapine (ZYPREXA) 5 MG tablet Take 5 mg by mouth 2 (two) times daily.    Yes [provider]  omeprazole (PRILOSEC) 40 MG capsule Take 1 capsule (40 mg total) by mouth at bedtime. 05/15/19  Yes Hongalgi, Lenis Dickinson, MD  ondansetron (ZOFRAN) 8 MG tablet Take 8 mg by mouth every 6 (six) hours as needed for nausea or vomiting.   Yes [provider]  oxyCODONE (OXY IR/ROXICODONE) 5 MG immediate release tablet Take 5 mg by mouth 2 (two) times daily as needed for severe pain.   Yes [provider]  polyethylene glycol (MIRALAX) 17 g packet Take 17 g by mouth daily. Patient taking differently: Take 17 g by mouth daily. Mix 1 capful (17 g) in 4-8 oz of water or juice and give by mouth once daily for bowel regimen 12/11/18  Yes Swayze, Ava, DO  sodium chloride (OCEAN) 0.65 % SOLN nasal spray Place 2 sprays into both nostrils every 2 (two) hours as needed (dryness, irritation).   Yes [provider]  sodium chloride 1 g tablet Take 1 tablet (1 g total) by mouth 3 (three) times daily with  meals. Patient taking differently: Take 1 g by mouth 4 (four) times daily.  12/17/18  Yes Irene Pap N, DO  valproic acid (DEPAKENE)  250 MG capsule Take 2 capsules (500 mg total) by mouth 2 (two) times daily. Patient taking differently: Take 500 mg by mouth 3 (three) times daily.  12/11/18  Yes Swayze, Ava, DO  Zinc Gluconate 100 MG TABS Take 100 mg by mouth daily.   Yes [provider]  Amino Acids-Protein Hydrolys (FEEDING SUPPLEMENT, PRO-STAT SUGAR FREE 64,) LIQD Take 30 mLs by mouth 2 (two) times daily. Patient not taking: Reported on 07/09/2019 03/14/19   Desiree Hane, MD   Liver Function Tests Recent Labs  Lab 07/09/19 0725  AST 18  ALT 12  ALKPHOS 65  BILITOT 0.3  PROT 6.7  ALBUMIN 2.8*   Recent Labs  Lab 07/09/19 0725  LIPASE 26   CBC Recent Labs  Lab 07/09/19 0725 07/10/19 0259  WBC 4.1 4.4  NEUTROABS 2.5  --   HGB 12.6 11.7*  HCT 37.0 34.4*  MCV 90.2 89.8  PLT 167 [id number]   Basic Metabolic Panel Recent Labs  Lab 07/09/19 0725 07/09/19 1200 07/09/19 1700 07/10/19 0259  NA 118* 124* 125* 125*  K 5.0 5.3* 5.3* 5.0  CL 83* 87* 88* 90*  CO2 27 29 25 28   GLUCOSE 88 91 177* 132*  BUN 15 13 15  23*  CREATININE 0.69 0.63 0.95 1.40*  CALCIUM 8.4* 8.5* 8.5* 8.3*   Iron/TIBC/Ferritin/ %Sat No results found for: IRON, TIBC, FERRITIN, IRONPCTSAT  Vitals:   07/11/19 1441 07/11/19 1448 07/11/19 1451 07/11/19 1526  BP: (!) 221/44 (!) 223/39 (!) 215/42 (!) 211/63  Pulse: 70 69 69 71  Resp: 16 16 15 18   Temp:    (!) 97.4 F (36.3 C)  TempSrc:    Axillary  SpO2: 97% 98% 98% 98%  Weight:      Height:        Exam Gen alert, no tdistress No rash, cyanosis or gangrene Sclera anicteric, throat clear and moist  No jvd or bruits Chest clear bilat to base RRR no MRG Abd soft ntnd no mass or ascites +bs GU defer MS no joint effusions or deformity Ext no LE edema, no wounds or ulcers Neuro is alert, Ox 3 , nf    Home meds:  - valproic acid 500 tid/  oxycodone IR prn/ olanzapine 5 bid/ lurasidone 20 bid/ gabapentin 100 bid/ bupropion 150 qd/ citalopram 20 hs/ clonazepam 1 tid + 0.5 prn  - lisinopril 40 qd/ metoprolol 25 bid/ nifedipine xl 90  - sod chloride tabs 1gm qid/ demeclocycline 150 bid/ furosemide 20  - aspirin 81  - fluticasone +vilanterol 100-25 qd  - prn's/ vitamins/ supplements     Na 124 > 125 > 125 here    Creat 0.63 > 0.95 > 1.40 today    IV lasix 20mg  on 1/14 and po lasix 20mg  on 1/15 x 1    UOP 3.4L, 2.0L and 2.8 L on 1/14, 1/15 and today so far   Net I/O = 5.4L in and 8.2 L out    BP 197/60 on admit 1/14, dropped to 70's- 100's on 1/15 and back up to 189/57 today 1/16    UNa 32, Uosm 130. Uosm's from last 2 years are all < 150 mOsm/kg      Over last 11 years lowest was in the 70's (x 3) and highest was in the 200- 275 range (x4)  Assessment/ Plan: 1. AKI - due to diuresis-related hypotension+ ACEi. Low bp's have resolved. Creat 1.4 today.  DC acei. Cont IVF NS 100/hr. Should improve.  2.  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 Pt is euvolemic on exam. 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  Demeclocycline has not been shown to help with this, will dc.  Would dc salt tabs as well since this is given for SIADH.  If she needs lasix for edema then would resume after creat back to normal. pt is not symptomatic from her hyponatremia and this has been a recurrent problem over many years. There is no specific medication for psychogenic polydipsia. Lowering dilute fluid intake was recommended to patient.  3. HTN - would consider avoiding acei/ ARB in this pt, have started hydralazine and dc'd lisinopril for now.  4. Bipolar d/o/ schizoaffective disorder/ depression/ anxiety 5. COPD 6. HL      Kelly Splinter  MD 07/11/2019, 5:28 PM

## 2019-07-11 NOTE — Interval H&P Note (Signed)
History and Physical Interval Note: For EGD today to eval upper abd pain.  The nature of the procedure, as well as the risks, benefits, and alternatives were carefully and thoroughly reviewed with the patient. Ample time for discussion and questions allowed. The patient understood, was satisfied, and agreed to proceed.   07/11/2019 1:34 PM  Natalie Thomas  has presented today for surgery, with the diagnosis of abdominal pain, nausea.  The various methods of treatment have been discussed with the patient and family. After consideration of risks, benefits and other options for treatment, the patient has consented to  Procedure(s): ESOPHAGOGASTRODUODENOSCOPY (EGD) WITH PROPOFOL (N/A) as a surgical intervention.  The patient's history has been reviewed, patient examined, no change in status, stable for surgery.  I have reviewed the patient's chart and labs.  Questions were answered to the patient's satisfaction.     Natalie Thomas

## 2019-07-11 NOTE — Op Note (Signed)
[facility] Patient Name: Natalie Thomas Procedure Date : 07/11/2019 MRN: [id number] Attending MD: Jerene Bears , MD Date of Birth: 09-10-1960 CSN: [id number] Age: 59 Admit Type: Inpatient Procedure:                Upper GI endoscopy Indications:              Epigastric abdominal pain Providers:                Lajuan Lines. Hilarie Fredrickson, MD, Glori Bickers, RN, Elspeth Cho                            Tech., Technician, Rejeana Brock, CRNA Referring MD:             [facility] Medicines:                Monitored Anesthesia Care Complications:            No immediate complications. Estimated Blood Loss:     Estimated blood loss was minimal. Procedure:                Pre-Anesthesia Assessment:                           - Prior to the procedure, a History and Physical                            was performed, and patient medications and                            allergies were reviewed. The patient's tolerance of                            previous anesthesia was also reviewed. The risks                            and benefits of the procedure and the sedation                            options and risks were discussed with the patient.                            All questions were answered, and informed consent                            was obtained. Prior Anticoagulants: The patient has                            taken no previous anticoagulant or antiplatelet                            agents. ASA Grade Assessment: III - A patient with                            severe systemic disease. After reviewing the risks  and benefits, the patient was deemed in                            satisfactory condition to undergo the procedure.                           After obtaining informed consent, the endoscope was                            passed under direct vision. Throughout the                            procedure, the patient's blood pressure, pulse,  and                            oxygen saturations were monitored continuously. The                            [id number]) Olympus gastroscope was                            introduced through the mouth, and advanced to the                            second part of duodenum. The upper GI endoscopy was                            accomplished without difficulty. The patient                            tolerated the procedure well. Scope In: Scope Out: Findings:      Normal mucosa was found in the entire esophagus.      A 2 cm hiatal hernia was present.      Scattered moderate inflammation characterized by erythema was found in       the cardia, in the gastric fundus, in the gastric body and in the       gastric antrum. Biopsies were taken with a cold forceps for histology       and Helicobacter pylori testing.      The examined duodenum was normal. Impression:               - Normal mucosa was found in the entire esophagus.                           - 2 cm hiatal hernia.                           - Gastritis. Biopsied.                           - Normal examined duodenum. Moderate Sedation:      N/A Recommendation:           - Return patient to hospital ward for ongoing care.                           -  Resume previous diet and advance as tolerated.                           - Continue present medications, including daily PPI                            and H2 blocker qHS.                           - Await pathology results to exclude H. Pylori.                           - Will follow-up pathology. If negative, then there                            is no clear explanation for her nausea and                            epigastric pain. Polypharmacy could potentially                            explain nausea. 4 hour gastric emptying study could                            be considered for further evaluation of nausea and                            upper abdominal pain. If persistent  symptoms,                            evaluation could continue as an outpatient.                           - Routine, outpatient screening colonoscopy is                            recommended. Procedure Code(s):        --- Professional ---                           [phone], Esophagogastroduodenoscopy, flexible,                            transoral; with biopsy, single or multiple Diagnosis Code(s):        --- Professional ---                           K44.9, Diaphragmatic hernia without obstruction or                            gangrene                           K29.70, Gastritis, unspecified, without bleeding  R10.13, Epigastric pain CPT copyright 2019 American Medical Association. All rights reserved. The codes documented in this report are preliminary and upon coder review may  be revised to meet current compliance requirements. Jerene Bears, MD 07/11/2019 2:23:57 PM This report has been signed electronically. Number of Addenda: 0

## 2019-07-11 NOTE — Progress Notes (Signed)
Patient left unit for ENDO at this time.  Ave Filter, RN

## 2019-07-11 NOTE — Progress Notes (Signed)
Patient returned from ENDO, c/o abdominal pain. Pain medication administered along with daily meds. B/P remain elevated since ENDO. PRN hydralazine and daily b/p meds given. Will recheck b/p. No other distress or concerns voice.  Ave Filter, RN

## 2019-07-11 NOTE — Anesthesia Preprocedure Evaluation (Signed)
Anesthesia Evaluation  Patient identified by MRN, date of birth, ID band Patient awake    Reviewed: Allergy & Precautions, NPO status , Patient's Chart, lab work & pertinent test results  Airway Mallampati: II  TM Distance: >3 FB Neck ROM: Full    Dental  (+) Dental Advisory Given   Pulmonary shortness of breath, COPD,  oxygen dependent, Current Smoker,    breath sounds clear to auscultation       Cardiovascular hypertension, Pt. on medications and Pt. on home beta blockers +CHF   Rhythm:Regular Rate:Normal     Neuro/Psych Anxiety Depression Bipolar Disorder Schizophrenia negative neurological ROS     GI/Hepatic Neg liver ROS, GERD  ,  Endo/Other  diabetes  Renal/GU ARFRenal disease     Musculoskeletal   Abdominal   Peds  Hematology  (+) anemia ,   Anesthesia Other Findings   Reproductive/Obstetrics                             Anesthesia Physical Anesthesia Plan  ASA: III  Anesthesia Plan: MAC   Post-op Pain Management:    Induction: Intravenous  PONV Risk Score and Plan: 1 and Propofol infusion, Ondansetron and Treatment may vary due to age or medical condition  Airway Management Planned: Natural Airway and Simple Face Mask  Additional Equipment:   Intra-op Plan:   Post-operative Plan:   Informed Consent: I have reviewed the patients History and Physical, chart, labs and discussed the procedure including the risks, benefits and alternatives for the proposed anesthesia with the patient or authorized representative who has indicated his/her understanding and acceptance.       Plan Discussed with: CRNA  Anesthesia Plan Comments:         Anesthesia Quick Evaluation

## 2019-07-11 NOTE — Procedures (Addendum)
I tried contacting the patient's legal guardian by phone on her mobile number, listed as preferred, to update her after upper endoscopy. I reached her voicemail and left a message

## 2019-07-11 NOTE — Progress Notes (Signed)
RN able to get in touch with Vernell Leep at [phone] to consent patient for procedure.   2 RNs witnessed consent at this time.  Ave Filter, RN

## 2019-07-11 NOTE — Progress Notes (Signed)
PROGRESS NOTE    Natalie Thomas   [id number]  DOB: 1961-01-29  DOA: 07/09/2019 PCP: System, Pcp Not In   Brief Narrative:  Natalie Thomas is a 59 y.o.femalewith medical history significant ofbipolar/schizoaffective disorder, HTN, HLD, DM, COPDon 2Loxygen, chronic hyponatremia anddiastolicCHF.She presents with complaints of epigastric abdominal pain. Complains of having gagging and dry heaves which further worsens her abdominal pain. Believe symptoms are related with her abdominal hernia.  In the ED the patient is found to have a sodium of 118.  Patient was last admitted from 1/5 through 1/11 for the same complaints which are abdominal pain, nausea and was found to have a sodium of 121 at that time. Takes demeclocycline for her hyponatremia.  Subjective: She has ongoing nausea and upper abdominal pain.     Assessment & Plan:   Principal Problem:   Hyponatremia with suspected acute on chronic systolic heart failure -She states that she has been unable to eat and drink at home but has been able to keep her medications down -He is on Lasix and sodium chloride tablets along with demeclocycline at home - - sodium has improved to 125 -Her poor oral intake/abdominal issues are likely contributing to her chronically low sodium -I have asked for a nephrology consult for long-term management  Active Problems:   Chronic abdominal pain/GERD -Ongoing nausea is limiting her from eating any food today therefore I will place her on a liquid diet -Multiple CT scans have been essentially negative and I feel it is time for a GI eval -Continue Protonix-add Pepcid-consulted GI - EGD today shows gastritis- GI has advanced her back to solid food- will start Voltaren gel for abdominal wall pain  COPD (chronic obstructive pulmonary disease) ([place]) -Continue inhalers-no acute exacerbation noted  Hypertension - Continue nifedipine, lisinopril and Lopressor    Schizoaffective  disorder, bipolar type (HCC) -Continue Wellbutrin, Neurontin, Latuda, clonazepam, Celexa, Zyprexa, hydroxyzine and valproic acid    Time spent in minutes: 5 DVT prophylaxis: Lovenox Code Status: Full code Family Communication:  Disposition Plan: Continue to treat sodium and abdominal issues Consultants:   GI Procedures:   None Antimicrobials:  Anti-infectives (From admission, onward)   Start     Dose/Rate Route Frequency Ordered Stop   07/09/19 1130  demeclocycline (DECLOMYCIN) tablet 150 mg     150 mg Oral 2 times daily 07/09/19 1126         Objective: Vitals:   07/11/19 1441 07/11/19 1448 07/11/19 1451 07/11/19 1526  BP: (!) 221/44 (!) 223/39 (!) 215/42 (!) 211/63  Pulse: 70 69 69 71  Resp: 16 16 15 18   Temp:    (!) 97.4 F (36.3 C)  TempSrc:    Axillary  SpO2: 97% 98% 98% 98%  Weight:      Height:        Intake/Output Summary (Last 24 hours) at 07/11/2019 1637 Last data filed at 07/11/2019 1528 Gross per 24 hour  Intake 3696.28 ml  Output 2900 ml  Net 796.28 ml   Filed Weights   07/09/19 1804 07/10/19 0553 07/11/19 1257  Weight: 88.1 kg 88.1 kg 88.1 kg    Examination: General exam: Appears comfortable  HEENT: PERRLA, oral mucosa moist, no sclera icterus or thrush Respiratory system: Clear to auscultation. Respiratory effort normal. Cardiovascular system: S1 & S2 heard,  No murmurs  Gastrointestinal system: Abdomen soft,  Tender in upper abdomen, nondistended. Normal bowel sounds   Central nervous system: Alert and oriented. No focal neurological deficits. Extremities: No cyanosis, clubbing  or edema Skin: No rashes or ulcers Psychiatry:  Mood & affect appropriate.     Data Reviewed: I have personally reviewed following labs and imaging studies  CBC: Recent Labs  Lab 07/09/19 0725 07/10/19 0259  WBC 4.1 4.4  NEUTROABS 2.5  --   HGB 12.6 11.7*  HCT 37.0 34.4*  MCV 90.2 89.8  PLT 167 99991111   Basic Metabolic Panel: Recent Labs  Lab  07/09/19 0725 07/09/19 1200 07/09/19 1700 07/10/19 0259  NA 118* 124* 125* 125*  K 5.0 5.3* 5.3* 5.0  CL 83* 87* 88* 90*  CO2 27 29 25 28   GLUCOSE 88 91 177* 132*  BUN 15 13 15  23*  CREATININE 0.69 0.63 0.95 1.40*  CALCIUM 8.4* 8.5* 8.5* 8.3*   GFR: Estimated Creatinine Clearance: 48.6 mL/min (A) (by C-G formula based on SCr of 1.4 mg/dL (H)). Liver Function Tests: Recent Labs  Lab 07/09/19 0725  AST 18  ALT 12  ALKPHOS 65  BILITOT 0.3  PROT 6.7  ALBUMIN 2.8*   Recent Labs  Lab 07/09/19 0725  LIPASE 26   No results for input(s): AMMONIA in the last 168 hours. Coagulation Profile: Recent Labs  Lab 07/09/19 0725  INR 0.9   Cardiac Enzymes: No results for input(s): CKTOTAL, CKMB, CKMBINDEX, TROPONINI in the last 168 hours. BNP (last 3 results) No results for input(s): PROBNP in the last 8760 hours. HbA1C: No results for input(s): HGBA1C in the last 72 hours. CBG: No results for input(s): GLUCAP in the last 168 hours. Lipid Profile: No results for input(s): CHOL, HDL, LDLCALC, TRIG, CHOLHDL, LDLDIRECT in the last 72 hours. Thyroid Function Tests: No results for input(s): TSH, T4TOTAL, FREET4, T3FREE, THYROIDAB in the last 72 hours. Anemia Panel: No results for input(s): VITAMINB12, FOLATE, FERRITIN, TIBC, IRON, RETICCTPCT in the last 72 hours. Urine analysis:    Component Value Date/Time   COLORURINE YELLOW 07/02/2019 1048   APPEARANCEUR CLEAR 07/02/2019 1048   APPEARANCEUR Clear 06/22/2012 0618   LABSPEC 1.012 07/02/2019 1048   LABSPEC 1.003 06/22/2012 0618   PHURINE 6.0 07/02/2019 1048   GLUCOSEU NEGATIVE 07/02/2019 1048   GLUCOSEU Negative 06/22/2012 0618   GLUCOSEU NEGATIVE 04/09/2011 1058   HGBUR NEGATIVE 07/02/2019 1048   BILIRUBINUR NEGATIVE 07/02/2019 1048   BILIRUBINUR Negative 06/22/2012 0618   KETONESUR NEGATIVE 07/02/2019 1048   PROTEINUR NEGATIVE 07/02/2019 1048   UROBILINOGEN 1.0 08/18/2011 1805   NITRITE NEGATIVE 07/02/2019 1048    LEUKOCYTESUR NEGATIVE 07/02/2019 1048   LEUKOCYTESUR Trace 06/22/2012 0618   Sepsis Labs: @LABRCNTIP (procalcitonin:4,lacticidven:4) ) Recent Results (from the past 240 hour(s))  Culture, Urine     Status: None   Collection Time: 07/02/19 10:48 AM   Specimen: Urine, Catheterized  Result Value Ref Range Status   Specimen Description   Final    URINE, CATHETERIZED Performed at [facility], [place] [address] [place], [place] [address]    Special Requests   Final    NONE Performed at [facility], [place] [address], [place], [place] [address]    Culture   Final    NO GROWTH Performed at [facility], [place] [address], [place], [place] [address]    Report Status 07/03/2019 FINAL  Final  SARS CORONAVIRUS 2 (TAT 6-24 HRS) Nasopharyngeal Nasopharyngeal Swab     Status: None   Collection Time: 07/09/19 10:27 AM   Specimen: Nasopharyngeal Swab  Result Value Ref Range Status   SARS Coronavirus 2 NEGATIVE NEGATIVE Final  Comment: (NOTE) SARS-CoV-2 target nucleic acids are NOT DETECTED. The SARS-CoV-2 RNA is generally detectable in upper and lower respiratory specimens during the acute phase of infection. Negative results do not preclude SARS-CoV-2 infection, do not rule out co-infections with other pathogens, and should not be used as the sole basis for treatment or other patient management decisions. Negative results must be combined with clinical observations, patient history, and epidemiological information. The expected result is Negative. Fact Sheet for Patients: SugarRoll.be Fact Sheet for Healthcare Providers: https://www.woods-mathews.com/ This test is not yet approved or cleared by the [place] FDA and  has been authorized for detection and/or diagnosis of SARS-CoV-2 by FDA under an Emergency Use Authorization (EUA). This EUA will remain  in effect (meaning this test can be used) for the  duration of the COVID-19 declaration under Section 56 4(b)(1) of the Act, 21 U.S.C. section 360bbb-3(b)(1), unless the authorization is terminated or revoked sooner. Performed at [facility], [place] [address] [place], [place] [address]          Radiology Studies: No results found.    Scheduled Meds: . aspirin  81 mg Oral Daily  . buPROPion  150 mg Oral Daily  . citalopram  20 mg Oral QHS  . clonazePAM  1 mg Oral TID  . demeclocycline  150 mg Oral BID  . docusate sodium  100 mg Oral BID  . enoxaparin (LOVENOX) injection  40 mg Subcutaneous Q24H  . feeding supplement (ENSURE ENLIVE)  237 mL Oral Q24H  . fluticasone  2 spray Each Nare BID  . fluticasone furoate-vilanterol  1 puff Inhalation Daily  . gabapentin  100 mg Oral BID  . guaiFENesin  1,200 mg Oral Daily  . hydrOXYzine  50 mg Oral TID  . ipratropium-albuterol  3 mL Nebulization TID  . lisinopril  40 mg Oral Daily  . loratadine  10 mg Oral Daily  . lurasidone  20 mg Oral BID  . Melatonin  6 mg Oral QHS  . metoprolol tartrate  25 mg Oral BID  . nicotine  7 mg Transdermal Daily  . NIFEdipine  90 mg Oral Daily  . OLANZapine  5 mg Oral BID  . pantoprazole  40 mg Oral Daily  . polyethylene glycol  17 g Oral Daily  . sodium chloride flush  3 mL Intravenous Q12H  . sodium chloride  1 g Oral QID  . valproic acid  500 mg Oral TID   Continuous Infusions: . sodium chloride 100 mL/hr at 07/11/19 1530  . famotidine (PEPCID) IV 20 mg (07/11/19 0912)     LOS: 2 days      Debbe Odea, MD [facility] Pager: [email or web address] Password [facility] 07/11/2019, 4:37 PM

## 2019-07-11 NOTE — Anesthesia Postprocedure Evaluation (Signed)
Anesthesia Post Note  Patient: Natalie Thomas Ask  Procedure(s) Performed: ESOPHAGOGASTRODUODENOSCOPY (EGD) WITH PROPOFOL (N/A ) BIOPSY     Patient location during evaluation: PACU Anesthesia Type: MAC Level of consciousness: awake and alert Pain management: pain level controlled Vital Signs Assessment: post-procedure vital signs reviewed and stable Respiratory status: spontaneous breathing, nonlabored ventilation, respiratory function stable and patient connected to nasal cannula oxygen Cardiovascular status: stable and blood pressure returned to baseline Postop Assessment: no apparent nausea or vomiting Anesthetic complications: no    Last Vitals:  Vitals:   07/11/19 1441 07/11/19 1448  BP: (!) 221/44 (!) 223/39  Pulse: 70 69  Resp: 16 16  Temp:    SpO2: 97% 98%    Last Pain:  Vitals:   07/11/19 1412  TempSrc: Oral  PainSc: 8                  [name]

## 2019-07-12 DIAGNOSIS — R110 Nausea: Secondary | ICD-10-CM

## 2019-07-12 LAB — BASIC METABOLIC PANEL
Anion gap: 7 (ref 5–15)
Anion gap: 5 (ref 5–15)
BUN: 16 mg/dL (ref 6–20)
BUN: 14 mg/dL (ref 6–20)
CO2: 27 mmol/L (ref 22–32)
CO2: 28 mmol/L (ref 22–32)
Calcium: 7.9 mg/dL — ABNORMAL LOW (ref 8.9–10.3)
Calcium: 8.4 mg/dL — ABNORMAL LOW (ref 8.9–10.3)
Chloride: 94 mmol/L — ABNORMAL LOW (ref 98–111)
Chloride: 94 mmol/L — ABNORMAL LOW (ref 98–111)
Creatinine, Ser: 0.58 mg/dL (ref 0.44–1.00)
Creatinine, Ser: 0.59 mg/dL (ref 0.44–1.00)
GFR calc Af Amer: >60 mL/min (ref >60)
GFR calc Af Amer: >60 mL/min (ref >60)
GFR calc non Af Amer: >60 mL/min (ref >60)
GFR calc non Af Amer: >60 mL/min (ref >60)
Glucose, Bld: 131 mg/dL — ABNORMAL HIGH (ref 70–99)
Glucose, Bld: 103 mg/dL — ABNORMAL HIGH (ref 70–99)
Potassium: 4.7 mmol/L (ref 3.5–5.1)
Potassium: 5.2 mmol/L — ABNORMAL HIGH (ref 3.5–5.1)
Sodium: 128 mmol/L — ABNORMAL LOW (ref 135–145)
Sodium: 127 mmol/L — ABNORMAL LOW (ref 135–145)

## 2019-07-12 MED ORDER — FAMOTIDINE 20 MG PO TABS
20.0000 mg | ORAL_TABLET | Freq: Two times a day (BID) | ORAL | Status: DC
  Administered 2019-07-12 – 2019-07-17 (×10): 20 mg via ORAL
  Filled 2019-07-12 (×10): qty 1

## 2019-07-12 MED ORDER — PANTOPRAZOLE SODIUM 40 MG PO TBEC
40.0000 mg | DELAYED_RELEASE_TABLET | Freq: Two times a day (BID) | ORAL | Status: DC
  Administered 2019-07-12 – 2019-07-17 (×10): 40 mg via ORAL
  Filled 2019-07-12 (×10): qty 1

## 2019-07-12 NOTE — Progress Notes (Addendum)
PROGRESS NOTE    Natalie Thomas   [id number]  DOB: Nov 29, 1960  DOA: 07/09/2019 PCP: System, Pcp Not In   Brief Narrative:  Natalie Thomas is a 59 y.o.femalewith medical history significant ofbipolar/schizoaffective disorder, HTN, HLD, DM, COPDon 2Loxygen, chronic hyponatremia anddiastolicCHF.She presents with complaints of epigastric abdominal pain. Complains of having gagging and dry heaves which further worsens her abdominal pain. Believe symptoms are related with her abdominal hernia.  In the ED the patient is found to have a sodium of 118.  Patient was last admitted from 1/5 through 1/11 for the same complaints which are abdominal pain, nausea and was found to have a sodium of 121 at that time. Takes demeclocycline for her hyponatremia.  Subjective: She woke up with severe nausea and continues to have upper abdominal pain. She was able to eat breakfast and is drinking her coffee.     Assessment & Plan:   Principal Problem:   Hyponatremia- chronic d CHF   -He is on Lasix, sodium chloride tablets along with demeclocycline at home - -Her poor oral intake/abdominal issues and maybe her psych meds are likely contributing to her chronically low sodium -I have asked for a nephrology consult for long-term management - nephrology suspects she is drinking too many liquids and have fluid restricted her to 2 L today - sodium tabs, Lasix and demeclocycline all d/c'd by nephrology- if she is to develop fluid overload related to d CHF, Lasix can be resumed  Active Problems:   Chronic abdominal pain/GERD -Multiple CT scans have been essentially negative and I feel it is time for a GI eval -Continue Protonix-added Pepcid-consulted GI - 1/16 > EGD shows diffuse gastritis and a small hiatal hernia- biopsies were obtained - GI has advanced her back to solid food but she is still very nauseated and has on going pain- have discussed dietary restrictions with her- she drinks a  lot of soda and coffee- will ask dietician to discuss bland diet, low in caffeine, fats and spicy foods- she states she has no idea what she can eat  COPD (chronic obstructive pulmonary disease) ([place]) -Continue inhalers-no acute exacerbation noted  Hypertension - Continue nifedipine, lisinopril and Lopressor    Schizoaffective disorder, bipolar type (HCC) -Continue Wellbutrin, Neurontin, Latuda, clonazepam, Celexa, Zyprexa, hydroxyzine and valproic acid  Morbid obesity - needs to lose weight Body mass index is 32.28 kg/m.     Time spent in minutes: 5 DVT prophylaxis: Lovenox Code Status: Full code Family Communication:  Disposition Plan: Continue to treat sodium and abdominal issues- will go to a group home but she does not want to go back to the same place she came from Consultants:   GI  nephro Procedures:   EGD Antimicrobials:  Anti-infectives (From admission, onward)   Start     Dose/Rate Route Frequency Ordered Stop   07/09/19 1130  demeclocycline (DECLOMYCIN) tablet 150 mg  Status:  Discontinued     150 mg Oral 2 times daily 07/09/19 1126 07/11/19 1838       Objective: Vitals:   07/11/19 1804 07/11/19 2118 07/12/19 0500 07/12/19 0840  BP: 122/60 107/76  120/68  Pulse: (!) 57 (!) 59  (!) 58  Resp: 18 17  18   Temp: 98.2 F (36.8 C) 98.4 F (36.9 C)  98 F (36.7 C)  TempSrc: Oral Oral  Oral  SpO2: 100% 97%  100%  Weight:   88 kg   Height:        Intake/Output Summary (Last 24  hours) at 07/12/2019 1212 Last data filed at 07/12/2019 1059 Gross per 24 hour  Intake 3793.63 ml  Output 3200 ml  Net 593.63 ml   Filed Weights   07/10/19 0553 07/11/19 1257 07/12/19 0500  Weight: 88.1 kg 88.1 kg 88 kg    Examination: General exam: Appears comfortable  HEENT: PERRLA, oral mucosa moist, no sclera icterus or thrush Respiratory system: Clear to auscultation. Respiratory effort normal. Cardiovascular system: S1 & S2 heard,  No murmurs  Gastrointestinal  system: Abdomen soft,  Tender in upper abdomen, nondistended. Normal bowel sounds   Central nervous system: Alert and oriented. No focal neurological deficits. Extremities: No cyanosis, clubbing or edema Skin: No rashes or ulcers Psychiatry:  Mood & affect appropriate.     Data Reviewed: I have personally reviewed following labs and imaging studies  CBC: Recent Labs  Lab 07/09/19 0725 07/10/19 0259  WBC 4.1 4.4  NEUTROABS 2.5  --   HGB 12.6 11.7*  HCT 37.0 34.4*  MCV 90.2 89.8  PLT 167 99991111   Basic Metabolic Panel: Recent Labs  Lab 07/09/19 0725 07/09/19 1200 07/09/19 1700 07/10/19 0259 07/12/19 0521  NA 118* 124* 125* 125* 128*  K 5.0 5.3* 5.3* 5.0 4.7  CL 83* 87* 88* 90* 94*  CO2 27 29 25 28 27   GLUCOSE 88 91 177* 132* 131*  BUN 15 13 15  23* 16  CREATININE 0.69 0.63 0.95 1.40* 0.58  CALCIUM 8.4* 8.5* 8.5* 8.3* 7.9*   GFR: Estimated Creatinine Clearance: 85 mL/min (by C-G formula based on SCr of 0.58 mg/dL). Liver Function Tests: Recent Labs  Lab 07/09/19 0725  AST 18  ALT 12  ALKPHOS 65  BILITOT 0.3  PROT 6.7  ALBUMIN 2.8*   Recent Labs  Lab 07/09/19 0725  LIPASE 26   No results for input(s): AMMONIA in the last 168 hours. Coagulation Profile: Recent Labs  Lab 07/09/19 0725  INR 0.9   Cardiac Enzymes: No results for input(s): CKTOTAL, CKMB, CKMBINDEX, TROPONINI in the last 168 hours. BNP (last 3 results) No results for input(s): PROBNP in the last 8760 hours. HbA1C: No results for input(s): HGBA1C in the last 72 hours. CBG: No results for input(s): GLUCAP in the last 168 hours. Lipid Profile: No results for input(s): CHOL, HDL, LDLCALC, TRIG, CHOLHDL, LDLDIRECT in the last 72 hours. Thyroid Function Tests: No results for input(s): TSH, T4TOTAL, FREET4, T3FREE, THYROIDAB in the last 72 hours. Anemia Panel: No results for input(s): VITAMINB12, FOLATE, FERRITIN, TIBC, IRON, RETICCTPCT in the last 72 hours. Urine analysis:    Component  Value Date/Time   COLORURINE YELLOW 07/02/2019 1048   APPEARANCEUR CLEAR 07/02/2019 1048   APPEARANCEUR Clear 06/22/2012 0618   LABSPEC 1.012 07/02/2019 1048   LABSPEC 1.003 06/22/2012 0618   PHURINE 6.0 07/02/2019 1048   GLUCOSEU NEGATIVE 07/02/2019 1048   GLUCOSEU Negative 06/22/2012 0618   GLUCOSEU NEGATIVE 04/09/2011 1058   HGBUR NEGATIVE 07/02/2019 1048   BILIRUBINUR NEGATIVE 07/02/2019 1048   BILIRUBINUR Negative 06/22/2012 0618   KETONESUR NEGATIVE 07/02/2019 1048   PROTEINUR NEGATIVE 07/02/2019 1048   UROBILINOGEN 1.0 08/18/2011 1805   NITRITE NEGATIVE 07/02/2019 1048   LEUKOCYTESUR NEGATIVE 07/02/2019 1048   LEUKOCYTESUR Trace 06/22/2012 0618   Sepsis Labs: @LABRCNTIP (procalcitonin:4,lacticidven:4) ) Recent Results (from the past 240 hour(s))  SARS CORONAVIRUS 2 (TAT 6-24 HRS) Nasopharyngeal Nasopharyngeal Swab     Status: None   Collection Time: 07/09/19 10:27 AM   Specimen: Nasopharyngeal Swab  Result Value Ref Range Status  SARS Coronavirus 2 NEGATIVE NEGATIVE Final    Comment: (NOTE) SARS-CoV-2 target nucleic acids are NOT DETECTED. The SARS-CoV-2 RNA is generally detectable in upper and lower respiratory specimens during the acute phase of infection. Negative results do not preclude SARS-CoV-2 infection, do not rule out co-infections with other pathogens, and should not be used as the sole basis for treatment or other patient management decisions. Negative results must be combined with clinical observations, patient history, and epidemiological information. The expected result is Negative. Fact Sheet for Patients: SugarRoll.be Fact Sheet for Healthcare Providers: https://www.woods-mathews.com/ This test is not yet approved or cleared by the [place] FDA and  has been authorized for detection and/or diagnosis of SARS-CoV-2 by FDA under an Emergency Use Authorization (EUA). This EUA will remain  in effect (meaning  this test can be used) for the duration of the COVID-19 declaration under Section 56 4(b)(1) of the Act, 21 U.S.C. section 360bbb-3(b)(1), unless the authorization is terminated or revoked sooner. Performed at [facility], [place] [address] [place], [place] [address]          Radiology Studies: No results found.    Scheduled Meds: . aspirin  81 mg Oral Daily  . buPROPion  150 mg Oral Daily  . citalopram  20 mg Oral QHS  . clonazePAM  1 mg Oral TID  . docusate sodium  100 mg Oral BID  . enoxaparin (LOVENOX) injection  40 mg Subcutaneous Q24H  . feeding supplement (ENSURE ENLIVE)  237 mL Oral Q24H  . fluticasone  2 spray Each Nare BID  . fluticasone furoate-vilanterol  1 puff Inhalation Daily  . gabapentin  100 mg Oral BID  . guaiFENesin  1,200 mg Oral Daily  . hydrALAZINE  25 mg Oral Q8H  . hydrOXYzine  50 mg Oral TID  . ipratropium-albuterol  3 mL Nebulization TID  . loratadine  10 mg Oral Daily  . lurasidone  20 mg Oral BID  . Melatonin  6 mg Oral QHS  . metoprolol tartrate  25 mg Oral BID  . nicotine  7 mg Transdermal Daily  . NIFEdipine  90 mg Oral Daily  . OLANZapine  5 mg Oral BID  . pantoprazole  40 mg Oral Daily  . polyethylene glycol  17 g Oral Daily  . sodium chloride flush  3 mL Intravenous Q12H  . valproic acid  500 mg Oral TID   Continuous Infusions: . famotidine (PEPCID) IV 20 mg (07/12/19 [id number])     LOS: 3 days      Debbe Odea, MD [facility] Pager: [email or web address] Password [email or web address] 07/12/2019, 12:12 PM

## 2019-07-12 NOTE — Progress Notes (Signed)
[facility] Progress Note  Subjective: 4.8 L UOP yest , Na up to 128  Vitals:   07/11/19 1804 07/11/19 2118 07/12/19 0500 07/12/19 0840  BP: 122/60 107/76  120/68  Pulse: (!) 57 (!) 59  (!) 58  Resp: 18 17  18   Temp: 98.2 F (36.8 C) 98.4 F (36.9 C)  98 F (36.7 C)  TempSrc: Oral Oral  Oral  SpO2: 100% 97%  100%  Weight:   88 kg   Height:        Exam: Gen alert, no tdistress No rash, cyanosis or gangrene Sclera anicteric, throat clear and moist  No jvd or bruits Chest clear bilat to base RRR no MRG Abd soft ntnd no mass or ascites +bs GU defer MS no joint effusions or deformity Ext no LE edema, no wounds or ulcers Neuro is alert, Ox 3 , nf    Home meds:  - valproic acid 500 tid/ oxycodone IR prn/ olanzapine 5 bid/ lurasidone 20 bid/ gabapentin 100 bid/ bupropion 150 qd/ citalopram 20 hs/ clonazepam 1 tid + 0.5 prn  - lisinopril 40 qd/ metoprolol 25 bid/ nifedipine xl 90  - sod chloride tabs 1gm qid/ demeclocycline 150 bid/ furosemide 20  - aspirin 81  - fluticasone +vilanterol 100-25 qd  - prn's/ vitamins/ supplements         IV lasix 20mg  on 1/14 and po lasix 20mg  on 1/15 x 1    UNa 32, Uosm 130. Uosm's from last 2 years are all < 150 mOsm/kg      Over last 11 years lowest was in the 70's (x 3) and highest was in the 200- 275 range (x4)  Assessment/ Plan: 1. AKI - due to diuresis-related hypotension+ ACEi. Low bp's have resolved. Creat 1.4 today.  DC acei. Got NS 100/hr and creat down to 0.58 today, resolved. Will dc 0.9% saline. I don't think she really ever had pulm edema.  2. 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 Pt is euvolemic on exam. 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 DC"d her home po lasix too, if she needs po lasix for edema,  then would resume at low dose after AKI resolved.  Pt is not symptomatic from her hyponatremia and this has been a recurrent problem over many years. There is no specific medication for psychogenic polydipsia. Lowering dilute fluid intake was recommended to patient, she drinks up to 1 gallon of fluid per day , told her to lower to 2 quarts max for now. Na up 128 today. Will order fluid restriction at 2 L here. Will arrange OP f/u at [place]. Will sign off.  3. HTN - would consider avoiding acei/ ARB in this pt in the future, have started hydralazine and dc'd lisinopril for now.  4. Bipolar d/o/ schizoaffective disorder/ depression/ anxiety 5. COPD 6. HL    Rob Makalynn Berwanger 07/12/2019, 10:25 AM  Inpatient medications: . aspirin  81 mg Oral Daily  . buPROPion  150 mg Oral Daily  . citalopram  20 mg Oral QHS  . clonazePAM  1 mg Oral TID  . docusate sodium  100 mg Oral BID  . enoxaparin (LOVENOX) injection  40 mg Subcutaneous Q24H  . feeding supplement (ENSURE ENLIVE)  237 mL Oral Q24H  . fluticasone  2 spray  Each Nare BID  . fluticasone furoate-vilanterol  1 puff Inhalation Daily  . gabapentin  100 mg Oral BID  . guaiFENesin  1,200 mg Oral Daily  . hydrALAZINE  25 mg Oral Q8H  . hydrOXYzine  50 mg Oral TID  . ipratropium-albuterol  3 mL Nebulization TID  . loratadine  10 mg Oral Daily  . lurasidone  20 mg Oral BID  . Melatonin  6 mg Oral QHS  . metoprolol tartrate  25 mg Oral BID  . nicotine  7 mg Transdermal Daily  . NIFEdipine  90 mg Oral Daily  . OLANZapine  5 mg Oral BID  . pantoprazole  40 mg Oral Daily  . polyethylene glycol  17 g Oral Daily  . sodium chloride flush  3 mL Intravenous Q12H  . valproic acid  500 mg Oral TID   . sodium chloride 100 mL/hr at 07/12/19 0201  . famotidine (PEPCID) IV 20 mg (07/12/19 [id number])   acetaminophen **OR** acetaminophen, albuterol, alum & mag hydroxide-simeth, benzonatate, calcium carbonate, clonazePAM, hydrALAZINE, loperamide,  neomycin-bacitracin-polymyxin, nystatin, OLANZapine, ondansetron **OR** ondansetron (ZOFRAN) IV, oxyCODONE, sodium chloride

## 2019-07-13 ENCOUNTER — Encounter (HOSPITAL_COMMUNITY): Payer: Self-pay | Admitting: Internal Medicine

## 2019-07-13 DIAGNOSIS — R1013 Epigastric pain: Secondary | ICD-10-CM

## 2019-07-13 DIAGNOSIS — R110 Nausea: Secondary | ICD-10-CM

## 2019-07-13 DIAGNOSIS — K2900 Acute gastritis without bleeding: Secondary | ICD-10-CM

## 2019-07-13 DIAGNOSIS — R631 Polydipsia: Secondary | ICD-10-CM

## 2019-07-13 DIAGNOSIS — G8929 Other chronic pain: Secondary | ICD-10-CM

## 2019-07-13 DIAGNOSIS — F54 Psychological and behavioral factors associated with disorders or diseases classified elsewhere: Secondary | ICD-10-CM

## 2019-07-13 HISTORY — DX: Psychological and behavioral factors associated with disorders or diseases classified elsewhere: F54

## 2019-07-13 LAB — BASIC METABOLIC PANEL
Anion gap: 7 (ref 5–15)
BUN: 8 mg/dL (ref 6–20)
CO2: 30 mmol/L (ref 22–32)
Calcium: 8.7 mg/dL — ABNORMAL LOW (ref 8.9–10.3)
Chloride: 93 mmol/L — ABNORMAL LOW (ref 98–111)
Creatinine, Ser: 0.61 mg/dL (ref 0.44–1.00)
GFR calc Af Amer: >60 mL/min (ref >60)
GFR calc non Af Amer: >60 mL/min (ref >60)
Glucose, Bld: 77 mg/dL (ref 70–99)
Potassium: 5.3 mmol/L — ABNORMAL HIGH (ref 3.5–5.1)
Sodium: 130 mmol/L — ABNORMAL LOW (ref 135–145)

## 2019-07-13 MED ORDER — OXYCODONE HCL 5 MG PO TABS
5.0000 mg | ORAL_TABLET | Freq: Once | ORAL | Status: AC
  Administered 2019-07-13: 5 mg via ORAL
  Filled 2019-07-13 (×2): qty 1

## 2019-07-13 MED ORDER — KETOROLAC TROMETHAMINE 30 MG/ML IJ SOLN
30.0000 mg | Freq: Four times a day (QID) | INTRAMUSCULAR | Status: AC
  Administered 2019-07-13 – 2019-07-14 (×4): 30 mg via INTRAVENOUS
  Filled 2019-07-13 (×4): qty 1

## 2019-07-13 NOTE — Progress Notes (Signed)
Nutrition Education Note  RD consulted for nutrition education regarding a bland diet, specifically -- no oily/greasy/fatty foods, no spicy foods, limited caffeine, and 2L fluid restriction.    RD provided "Gastroesophageal Reflux Disease (GERD) Nutrition Therapy" handout from the Academy of Nutrition and Dietetics. This handout was selected given pt has GERD dx and the handout reviews following a low-fat diet that avoids caffeine and spicy foods.  Reviewed patient's dietary recall and discussed ways for pt to meet nutrition goals over the next several weeks. Pt will likely be in a facility, so her dietary restrictions should be communicated to staff.  Explained reasons for pt to follow a bland diet. Reviewed recommended foods and foods to avoid. Also reviewed how to adhere to a 2L fluid restriction as indicated by MD.    Teach back method used. Pt verbalizes understanding of information provided.   RN in room at time of pt education. Pt expressed desire to transition off of full liquid diet. Pt reports that her usual diet is as follows: eggs and sausage for breakfast, deli meat sandwiches for lunch, and spaghetti or fish with rice and vegetables for dinner. Pt reports no recent wt loss and states she weighed ~170 lbs last year (unsure of exact time). Pt reports drinking one Ensure daily PTA.   Expect fair compliance.  Body mass index is Body mass index is 32.69 kg/m.Marland Kitchen Pt meets criteria for obesity class I based on current BMI.  Medications reviewed and include:  Colace, Miralax  Labs reviewed: Na 130 (L), K+ 5.3 (H)  Current diet order is full liquids with a 2L fluid restriction, patient is consuming approximately 100% of meals at this time. Pt currently has an order for Ensure Enlive once daily; pt reports she has not received one yet but will drink it when she does receive one. No further nutrition interventions warranted at this time. RD contact information provided. If additional nutrition  issues arise, please re-consult RD.   Larkin Ina, MS, RD, LDN Pager: [phone] Weekend/After Hours Pager: [phone]

## 2019-07-13 NOTE — Discharge Instructions (Signed)
edu

## 2019-07-13 NOTE — Consult Note (Signed)
[facility] Surgery Consult Note  Natalie Thomas [birth year]  CA:[id number].    Requesting MD: Marzetta Board Chief Complaint: Generalized abdominal pain for several days shortness of breath with cough, nausea, no emesis or diarrhea Reason for Consult: Periumbilical hernia  HPI: Patient is a 59 year old female with a medical history of bipolar/schizoaffective disorder, hypertension, hyperlipidemia, type 2 diabetes, COPD on chronic oxygen, chronic hyponatremia and diastolic congestive heart failure who presents with concerns of nausea and abdominal pain for the past few days on 06/30/2019 she complains of diffuse abdominal pain but appears worse over the epigastric area.  She was concerned it could be due to her hernia.  She was not sure if food made it better or worse.  She is not obstructed and had a bowel movement earlier that day.  She was treated for chronic constipation along with her acute on chronic hyponatremia.  She was discharged home on 07/06/2019.  She returned to hospital on 07/09/2019 again with similar complaints.  On readmission her sodium was again 118.  Chloride was 83, BNP was 178 D-dimer was 2.57 troponins are negative x2 chest x-ray shows significant cardiomegaly with signs of pleural effusion and interstitial edema.  Lower extremity Dopplers did not reveal any sign of DVT.  She was treated with Solu-Medrol, morphine and Lasix.  He was placed on fluid restriction and treated with [place] sodium chloride tablets.  She continues to complain of abdominal pain and was seen by GI.  She underwent an EGD on 07/11/2019 which showed some scattered gastritis which was biopsied.  This finding did not adequately explain her symptoms.  There is some concern that this might be gastroparesis. With her multiple psychiatric medications they are hesitant to put her on a trial of Reglan.  She has a known ventral hernia for some time.  This is never been incarcerated.  Her only abdominal surgery is a  laparoscopic cholecystectomy.   Currently she is afebrile vital signs are stable and sats are 9800% on 2 L nasal cannula.  NA 124 >>130(1/18), K+ 5.3, chloride 93, creatinine 0.61  Currently resides in a skilled nursing facility.  Review of Systems  Unable to perform ROS: Psychiatric disorder    History reviewed. No pertinent family history.  Past Medical History:  Diagnosis Date  . Anemia 10/16/2017  . Anxiety and depression   . CHF (congestive heart failure) ([place])   . Chronic hyponatremia 04/08/2007   Qualifier: Diagnosis of  By: Marca Ancona RMA, Lucy    . Closed comminuted intertrochanteric fracture of right femur ([place]) 11/07/2017  . COPD 04/08/2007   Qualifier: Diagnosis of  By: Marca Ancona RMA, Lucy    . Depression   . Diabetes mellitus, type 2 ([place])    pt denies but states that she has been treated for DM  . Essential hypertension 04/08/2007   Qualifier: Diagnosis of  By: Marca Ancona RMA, Lucy    . GERD (gastroesophageal reflux disease)   . Hyperlipidemia 10/07/2017  . Lumbar disc disease 04/09/2011  . MENOPAUSE, PREMATURE 04/08/2007   Qualifier: Diagnosis of  By: Marca Ancona RMA, Lucy    . 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 Osteoporosis 04/08/2007   Qualifier: Diagnosis of  By: Reatha Armour, Lucy    . Polycythemia secondary to smoking 04/09/2011  . Schizoaffective disorder, bipolar type ([place]) 07/05/2011    Past Surgical History:  Procedure Laterality Date  . BIOPSY  07/11/2019   Procedure: BIOPSY;  Surgeon: Hilarie Fredrickson,  Lajuan Lines, MD;  Location: [facility] ENDOSCOPY;  Service: Gastroenterology;;  . ESOPHAGOGASTRODUODENOSCOPY (EGD) WITH PROPOFOL N/A 07/11/2019   Procedure: ESOPHAGOGASTRODUODENOSCOPY (EGD) WITH PROPOFOL;  Surgeon: Jerene Bears, MD;  Location: [facility];  Service: Gastroenterology;  Laterality: N/A;  . EYE SURGERY     on left  eye, pt states that she sees bad out of the right eye and needs surgery there  . INTRAMEDULLARY (IM) NAIL  INTERTROCHANTERIC Right 11/13/2017   Procedure: INTRAMEDULLARY (IM) NAIL INTERTROCHANTRIC;  Surgeon: Shona Needles, MD;  Location: [place];  Service: Orthopedics;  Laterality: Right;  . PELVIC FRACTURE SURGERY      Social History:  reports that she has been smoking cigarettes. She has been smoking about 0.25 packs per day. She has never used smokeless tobacco. She reports that she does not drink alcohol or use drugs.  Allergies:  Allergies  Allergen Reactions  . Clarithromycin Itching  . Penicillins Itching    Has tolerated cefazolin before  Has patient had a PCN reaction causing immediate rash, facial/tongue/throat swelling, SOB or lightheadedness with hypotension: No Has patient had a PCN reaction causing severe rash involving mucus membranes or skin necrosis: No Has patient had a PCN reaction that required hospitalization: Unknown Has patient had a PCN reaction occurring within the last 10 years: No If all of the above answers are "NO", then may proceed with Cephalosporin use.     Medications Prior to Admission  Medication Sig Dispense Refill  . acetaminophen (TYLENOL) 325 MG tablet Take 325 mg by mouth every 6 (six) hours as needed for fever. Not to exceed 2000MG     . acetaminophen (TYLENOL) 500 MG tablet Take 1,000 mg by mouth every 8 (eight) hours as needed for mild pain or moderate pain. Not to exceed 3g/24h of tylenol from all sources.    Marland Kitchen albuterol (PROVENTIL) (2.5 MG/3ML) 0.083% nebulizer solution Take 3 mLs (2.5 mg total) by nebulization every 4 (four) hours as needed for wheezing or shortness of breath. 75 mL 12  . Alum & Mag Hydroxide-Simeth (ANTACID LIQUID PO) Take 30 mLs by mouth every 6 (six) hours as needed (heartburn).    Marland Kitchen aspirin 81 MG chewable tablet Chew 81 mg by mouth daily.    . benzonatate (TESSALON) 100 MG capsule Take 100 mg by mouth every 8 (eight) hours as needed for cough.    Marland Kitchen buPROPion (WELLBUTRIN XL) 150 MG 24 hr tablet Take 150 mg by mouth daily.    .  Calcium Carb-Cholecalciferol (CALCIUM 600+D) 600-800 MG-UNIT TABS Take 1 tablet by mouth daily.    . Calcium Carbonate 500 MG CHEW Chew 500 mg by mouth every 12 (twelve) hours as needed (indigestion).    . citalopram (CELEXA) 20 MG tablet Take 1 tablet (20 mg total) by mouth at bedtime.    . clonazePAM (KLONOPIN) 0.5 MG tablet Take 0.5 mg by mouth 2 (two) times daily as needed for anxiety.    . clonazePAM (KLONOPIN) 1 MG tablet Take 1 tablet (1 mg total) by mouth 2 (two) times daily. (Patient taking differently: Take 1 mg by mouth 3 (three) times daily. ) 10 tablet 0  . demeclocycline (DECLOMYCIN) 150 MG tablet Take 150 mg by mouth 2 (two) times daily.    Marland Kitchen docusate sodium (COLACE) 100 MG capsule Take 100 mg by mouth See admin instructions. Take one capsule (100 mg) by mouth every 12 hours, may also take one capsule (100 mg) twice daily as needed for constipation    . feeding  supplement, ENSURE ENLIVE, (ENSURE ENLIVE) LIQD Take 237 mLs by mouth daily. 237 mL 12  . fluticasone (FLONASE) 50 MCG/ACT nasal spray Place 2 sprays into both nostrils 2 (two) times daily.    . fluticasone furoate-vilanterol (BREO ELLIPTA) 100-25 MCG/INH AEPB Inhale 1 puff into the lungs daily.    . furosemide (LASIX) 20 MG tablet Take 20 mg by mouth daily.     Marland Kitchen gabapentin (NEURONTIN) 100 MG capsule Take 1 capsule (100 mg total) by mouth 2 (two) times daily.    Marland Kitchen guaiFENesin (MUCINEX) 600 MG 12 hr tablet Take 1,200 mg by mouth daily.    Marland Kitchen guaiFENesin (ROBITUSSIN) 100 MG/5ML liquid Take 200 mg by mouth every 8 (eight) hours as needed for cough.    . hydrOXYzine (VISTARIL) 50 MG capsule Take 50 mg by mouth 3 (three) times daily.    Marland Kitchen ipratropium-albuterol (DUONEB) 0.5-2.5 (3) MG/3ML SOLN Take 3 mLs by nebulization 3 (three) times daily.    Marland Kitchen lisinopril (ZESTRIL) 40 MG tablet Take 1 tablet (40 mg total) by mouth daily. 30 tablet 0  . loperamide (IMODIUM A-D) 2 MG tablet Take 2 mg by mouth every 6 (six) hours as needed for  diarrhea or loose stools.    Marland Kitchen loratadine (CLARITIN) 10 MG tablet Take 10 mg by mouth daily.    Marland Kitchen lurasidone (LATUDA) 20 MG TABS tablet Take 1 tablet (20 mg total) by mouth 2 (two) times daily.    . Melatonin 5 MG TABS Take 5 mg by mouth at bedtime.    . methocarbamol (ROBAXIN) 500 MG tablet Take 500 mg by mouth daily. And 1 tablet every 8 hrs as needed for spasms.    . metoprolol tartrate (LOPRESSOR) 25 MG tablet Take 1 tablet (25 mg total) by mouth 2 (two) times daily. 60 tablet 0  . Multiple Vitamin (MULTIVITAMIN WITH MINERALS) TABS tablet Take 1 tablet by mouth daily.    Marland Kitchen neomycin-bacitracin-polymyxin (NEOSPORIN) ointment Apply 1 application topically as needed for wound care.    . nicotine (NICODERM CQ - DOSED IN MG/24 HR) 7 mg/24hr patch Place 7 mg onto the skin daily.    Marland Kitchen NIFEdipine (PROCARDIA XL/ADALAT-CC) 90 MG 24 hr tablet Take 90 mg by mouth daily.    Marland Kitchen nystatin (NYSTATIN) powder Apply 1 application topically 2 (two) times daily as needed (irritation). Apply topically to reddened area twice daily as needed for irritation    . OLANZapine (ZYPREXA) 5 MG tablet Take 1 tablet (5 mg total) by mouth 2 (two) times daily. (Patient taking differently: Take 5 mg by mouth daily as needed (agitation). )    . OLANZapine (ZYPREXA) 5 MG tablet Take 5 mg by mouth 2 (two) times daily.     Marland Kitchen omeprazole (PRILOSEC) 40 MG capsule Take 1 capsule (40 mg total) by mouth at bedtime.    . ondansetron (ZOFRAN) 8 MG tablet Take 8 mg by mouth every 6 (six) hours as needed for nausea or vomiting.    Marland Kitchen oxyCODONE (OXY IR/ROXICODONE) 5 MG immediate release tablet Take 5 mg by mouth 2 (two) times daily as needed for severe pain.    . polyethylene glycol (MIRALAX) 17 g packet Take 17 g by mouth daily. (Patient taking differently: Take 17 g by mouth daily. Mix 1 capful (17 g) in 4-8 oz of water or juice and give by mouth once daily for bowel regimen) 14 each 0  . sodium chloride (OCEAN) 0.65 % SOLN nasal spray Place 2  sprays into both nostrils  every 2 (two) hours as needed (dryness, irritation).    . sodium chloride 1 g tablet Take 1 tablet (1 g total) by mouth 3 (three) times daily with meals. (Patient taking differently: Take 1 g by mouth 4 (four) times daily. ) 30 tablet 0  . valproic acid (DEPAKENE) 250 MG capsule Take 2 capsules (500 mg total) by mouth 2 (two) times daily. (Patient taking differently: Take 500 mg by mouth 3 (three) times daily. ) 120 capsule 0  . Zinc Gluconate 100 MG TABS Take 100 mg by mouth daily.    . Amino Acids-Protein Hydrolys (FEEDING SUPPLEMENT, PRO-STAT SUGAR FREE 64,) LIQD Take 30 mLs by mouth 2 (two) times daily. (Patient not taking: Reported on 07/09/2019) 887 mL 0    Blood pressure (!) 168/40, pulse (!) 56, temperature 97.9 F (36.6 C), temperature source Oral, resp. rate 18, height 5\' 5"  (1.651 m), weight 89.1 kg, SpO2 99 %. Physical Exam: Physical Exam Constitutional:      General: She is not in acute distress.    Appearance: She is obese. She is not ill-appearing, toxic-appearing or diaphoretic.  HENT:     Head: Normocephalic and atraumatic.     Mouth/Throat:     Mouth: Mucous membranes are moist.     Pharynx: Oropharynx is clear.  Eyes:     General: No scleral icterus.    Conjunctiva/sclera: Conjunctivae normal.     Comments: Pupils are equal  Neck:     Vascular: No carotid bruit.  Cardiovascular:     Rate and Rhythm: Normal rate and regular rhythm.     Pulses: Normal pulses.     Heart sounds: Normal heart sounds.  Pulmonary:     Effort: Pulmonary effort is normal.     Breath sounds: Normal breath sounds.  Abdominal:     General: Bowel sounds are normal. There is no distension.     Palpations: There is no mass.     Tenderness: There is no abdominal tenderness. There is no right CVA tenderness, left CVA tenderness, guarding or rebound.     Hernia: A hernia is present.     Comments: Patient has a prior laparoscopic cholecystectomy scar visible.  She has a 7  cm periumbilical hernia and diastases recti.  She points to pain from just below her xiphoid all the way down to her umbilical hernia which is soft, and easily reducible.  Musculoskeletal:     Cervical back: Normal range of motion and neck supple. No rigidity or tenderness.  Lymphadenopathy:     Cervical: No cervical adenopathy.  Skin:    General: Skin is warm and dry.     Capillary Refill: Capillary refill takes less than 2 seconds.     Coloration: Skin is not jaundiced or pale.     Findings: Lesion present. No bruising, erythema or rash.  Neurological:     General: No focal deficit present.     Mental Status: She is alert. Mental status is at baseline.     Cranial Nerves: No cranial nerve deficit.  Psychiatric:        Mood and Affect: Mood normal.     Comments: Patient's easily agitated and upset.  She does have a 7 cm umbilical hernia.  It is soft and easily reducible.  She also has a diastases, and points to her hernia all the way up to her xiphoid, complaining of pain.  I tried explaining to her that she has 2 problems the hernia and gastritis.  She  cannot grasp this concept.     Results for orders placed or performed during the hospital encounter of 07/09/19 (from the past 48 hour(s))  Basic metabolic panel     Status: Abnormal   Collection Time: 07/12/19  5:21 AM  Result Value Ref Range   Sodium 128 (L) 135 - 145 mmol/L   Potassium 4.7 3.5 - 5.1 mmol/L   Chloride 94 (L) 98 - 111 mmol/L   CO2 27 22 - 32 mmol/L   Glucose, Bld 131 (H) 70 - 99 mg/dL   BUN 16 6 - 20 mg/dL   Creatinine, Ser 0.58 0.44 - 1.00 mg/dL   Calcium 7.9 (L) 8.9 - 10.3 mg/dL   GFR calc non Af Amer >60 >60 mL/min   GFR calc Af Amer >60 >60 mL/min   Anion gap 7 5 - 15    Comment: Performed at [facility], [place] [address] [place], [place] [address]  BMET tomorrow am     Status: Abnormal   Collection Time: 07/12/19 12:27 PM  Result Value Ref Range   Sodium 127 (L) 135 - 145 mmol/L   Potassium 5.2 (H)  3.5 - 5.1 mmol/L   Chloride 94 (L) 98 - 111 mmol/L   CO2 28 22 - 32 mmol/L   Glucose, Bld 103 (H) 70 - 99 mg/dL   BUN 14 6 - 20 mg/dL   Creatinine, Ser 0.59 0.44 - 1.00 mg/dL   Calcium 8.4 (L) 8.9 - 10.3 mg/dL   GFR calc non Af Amer >60 >60 mL/min   GFR calc Af Amer >60 >60 mL/min   Anion gap 5 5 - 15    Comment: Performed at [facility] [address] [place], [place] [id number]  Basic metabolic panel     Status: Abnormal   Collection Time: 07/13/19  7:40 AM  Result Value Ref Range   Sodium 130 (L) 135 - 145 mmol/L   Potassium 5.3 (H) 3.5 - 5.1 mmol/L   Chloride 93 (L) 98 - 111 mmol/L   CO2 30 22 - 32 mmol/L   Glucose, Bld 77 70 - 99 mg/dL   BUN 8 6 - 20 mg/dL   Creatinine, Ser 0.61 0.44 - 1.00 mg/dL   Calcium 8.7 (L) 8.9 - 10.3 mg/dL   GFR calc non Af Amer >60 >60 mL/min   GFR calc Af Amer >60 >60 mL/min   Anion gap 7 5 - 15    Comment: Performed at [facility], [place] [address] [place], [place] [address]   No results found.     Assessment/Plan Hyponatremia/polydipsia GERD Hx tobacco use COPD with chronic hypoxic respiratory failure -on home O2 Hypertension Schizoaffective disorder, bipolar disorder  -On multiple medications Morbid obesity BMI 32.7  Chronic abdominal pain Nonobstructing small umbilical hernia Scattered gastritis -biopsies by EGD 07/11/2019  FEN: Full liquids ID: None DVT: Lovenox Follow-up: TBD  Plan: She does have a medium sized umbilical hernia that is soft and easily reducible.  She also has gastritis and most likely GERD symptoms.  I was not able to convince her that there are 2 different problems.  Will review with Dr. Brantley Stage.  I am not certain that fixing her hernia will resolve all of her symptoms.  I am also not sure she can understand that she is going to have more abdominal pain with surgery for some weeks before it gets better.  If we opt to go forward with surgery she would need medical clearance,  and management of her  schizoaffective disorder and bipolar disorder off oral agents for a short time.  Patient has a legal guardian that will also need to be involved in the decision-making.  Earnstine Regal [facility] Surgery 07/13/2019, 1:24 PM Please see Amion for pager number during day hours 7:00am-4:30pm

## 2019-07-13 NOTE — Progress Notes (Addendum)
Daily Rounding Note  07/13/2019, 12:27 PM  LOS: 4 days   SUBJECTIVE:   Chief complaint:  Nausea, abdominal pain   Both continue but tolerating full liquid diet.    OBJECTIVE:         Vital signs in last 24 hours:    Temp:  [97.8 F (36.6 C)-98.2 F (36.8 C)] 97.9 F (36.6 C) (01/18 0525) Pulse Rate:  [55-63] 56 (01/18 0525) Resp:  [17-18] 17 (01/18 0525) BP: (116-169)/(51-96) 134/96 (01/18 0525) SpO2:  [96 %-100 %] 98 % (01/18 0937) FiO2 (%):  [28 %] 28 % (01/17 1430) Weight:  [89.1 kg] 89.1 kg (01/18 0500) Last BM Date: 07/12/19 Filed Weights   07/11/19 1257 07/12/19 0500 07/13/19 0500  Weight: 88.1 kg 88 kg 89.1 kg   General: alert, anxious   Heart: rrr Chest: clear bil Abdomen: soft, mild to mod tenderness throughout.  Ventral hernia  Extremities: no CCE Neuro/Psych:  Alert, not confused.  Anxious.    Intake/Output from previous day: 01/17 0701 - 01/18 0700 In: 2893.9 [P.O.:2200; I.V.:593.9; IV Piggyback:100] Out: 4353 [id number]; Stool:1]  Intake/Output this shift: Total I/O In: 240 [P.O.:240] Out: 600 [Urine:600]  BMET Recent Labs    07/12/19 0521 07/12/19 1227 07/13/19 0740  NA 128* 127* 130*  K 4.7 5.2* 5.3*  CL 94* 94* 93*  CO2 27 28 30   GLUCOSE 131* 103* 77  BUN 16 14 8   CREATININE 0.58 0.59 0.61  CALCIUM 7.9* 8.4* 8.7*      Scheduled Meds: . aspirin  81 mg Oral Daily  . buPROPion  150 mg Oral Daily  . citalopram  20 mg Oral QHS  . clonazePAM  1 mg Oral TID  . docusate sodium  100 mg Oral BID  . enoxaparin (LOVENOX) injection  40 mg Subcutaneous Q24H  . famotidine  20 mg Oral BID  . feeding supplement (ENSURE ENLIVE)  237 mL Oral Q24H  . fluticasone  2 spray Each Nare BID  . fluticasone furoate-vilanterol  1 puff Inhalation Daily  . gabapentin  100 mg Oral BID  . guaiFENesin  1,200 mg Oral Daily  . hydrALAZINE  25 mg Oral Q8H  . hydrOXYzine  50 mg Oral TID  .  ipratropium-albuterol  3 mL Nebulization TID  . loratadine  10 mg Oral Daily  . lurasidone  20 mg Oral BID  . Melatonin  6 mg Oral QHS  . metoprolol tartrate  25 mg Oral BID  . nicotine  7 mg Transdermal Daily  . NIFEdipine  90 mg Oral Daily  . OLANZapine  5 mg Oral BID  . pantoprazole  40 mg Oral BID AC  . polyethylene glycol  17 g Oral Daily  . sodium chloride flush  3 mL Intravenous Q12H  . valproic acid  500 mg Oral TID   Continuous Infusions: PRN Meds:.acetaminophen **OR** acetaminophen, albuterol, alum & mag hydroxide-simeth, benzonatate, calcium carbonate, clonazePAM, hydrALAZINE, loperamide, neomycin-bacitracin-polymyxin, nystatin, OLANZapine, ondansetron **OR** ondansetron (ZOFRAN) IV, oxyCODONE, sodium chloride   ASSESMENT:   *    Chronic abdominal pain and nausea but still manages to tolerate diet. Multiple unrevealing CTs over the last few years. Non-obstructing, small umbilical hernia per CTs.   07/11/2019 EGD.  Scattered gastritis, biopsied.  2 cm HH.  Findings do not explain her symptoms.  Suspect this is functional and may be related to her polypharmacy. R/o gastroparesis.   Pt not able to understand that gastritis likely not the cause  of her sxs.  Insisting I look at the pictures.       *     Hyponatremia.  *   Mental illness and ? Developmental delay.     PLAN   *   Patient wanted endoscopy MD to call her aunt.  I attempted to call both her aunt Rochel Brome and [name], legal guardian.  No answers to the phone calls and no voicemail.  *   Consider 4 hour GES but ? Trial of Reglan bid?  Given her multiple psych meds not sure chronic reglan is advisable given potential for tardive dyskinesia.    *   Advance to chopped D2 diet.      Natalie Thomas  07/13/2019, 12:27 PM Phone [phone]     [facility] Attending   I have taken an interval history, reviewed the chart and examined the patient. I agree with the Advanced Practitioner's note,  impression and recommendations.   Difficult situation w/ mental illness, psychogenic polydipsia, mild gastritis which is unlikely to account for her problems.  I agree that hernia is present but would have an extremely high threshold to do surgery on that and current situation does not support. She also has a huge abdominal pannus with diastasis recti.  The hyponatremia is likely part of the nausea and vomiting. 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  I do not support doing a gastric emptying study or using Reglan.  If she would comply with fluid restriction then things might be better. She certainly may have some abdominal wall pain from the vomiting she has had.  Will see what the biopsies show and f/u  I am sorry to say that I do not have any great ideas. ? If psychiatry could help.  I will try some IV Toradol for the pain x 4 doses  Gatha Mayer, MD, [facility] Gastroenterology 07/13/2019 4:32 PM [phone]

## 2019-07-13 NOTE — Progress Notes (Signed)
PROGRESS NOTE  Natalie Thomas [id number] DOB: Aug 07, 1960 DOA: 07/09/2019 PCP: System, Pcp Not In   LOS: 4 days   Brief Narrative / Interim history: 59 year old female with history of bipolar/schizoaffective disorder, HTN, HLD, DM, COPD on chronic oxygen at home 2 L, chronic hyponatremia with recurrent hospitalizations for same, diastolic CHF, chronic abdominal pain.  She presents to the hospital again with epigastric abdominal pain along with nausea and dry heaves which fortunately worsens her abdominal pain.  She also has been having a history of abdominal hernia.  She was found to be hyponatremic in the ED with a sodium of 118.  Subjective / 24h Interval events: Complains of persistent symptoms today with abdominal pain, nausea and dry heaves.  She is very upset and she wants to know "the plan" to treat her chronic abdominal pain.   Assessment & Plan: Principal Problem Hyponatremia-chronic diastolic CHF -At home she is on Lasix, sodium chloride tablets along with demeclocycline -Her poor p.o. 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  Biopsies were obtained -GI has advance her diet however she continues to have persistent symptoms.  She complains of abdominal pain right at the hernia site, I have also consulted  general surgery today to evaluate, appreciate input  COPD with chronic hypoxic respiratory failure -No wheezing, this is stable, continue medications as below  Essential hypertension -Continue nifedipine, metoprolol, hydralazine, blood pressure stable  Schizoaffective disorder, bipolar type -Continue Wellbutrin, Klonopin, hydroxyzine, Zyprexa, valproic acid, Latuda  Morbid obesity -Based on BMI of 32, needs to lose weight   Scheduled Meds: . aspirin  81 mg Oral Daily  . buPROPion  150 mg Oral Daily  . citalopram  20 mg Oral QHS  . clonazePAM  1 mg Oral TID  . docusate sodium  100 mg Oral BID  . enoxaparin (LOVENOX) injection  40 mg Subcutaneous Q24H  . famotidine  20 mg Oral BID  . feeding supplement (ENSURE ENLIVE)  237 mL Oral Q24H  . fluticasone  2 spray Each Nare BID  . fluticasone furoate-vilanterol  1 puff Inhalation Daily  . gabapentin  100 mg Oral BID  . guaiFENesin  1,200 mg Oral Daily  . hydrALAZINE  25 mg Oral Q8H  . hydrOXYzine  50 mg Oral TID  . ipratropium-albuterol  3 mL Nebulization TID  . loratadine  10 mg Oral Daily  . lurasidone  20 mg Oral BID  . Melatonin  6 mg Oral QHS  . metoprolol tartrate  25 mg Oral BID  . nicotine  7 mg Transdermal Daily  . NIFEdipine  90 mg Oral Daily  . OLANZapine  5 mg Oral BID  . pantoprazole  40 mg Oral BID AC  . polyethylene glycol  17 g Oral Daily  . sodium chloride flush  3 mL Intravenous Q12H  . valproic acid  500 mg Oral TID  Continuous Infusions: PRN Meds:.acetaminophen **OR** acetaminophen, albuterol, alum & mag hydroxide-simeth, benzonatate, calcium carbonate, clonazePAM, hydrALAZINE, loperamide, neomycin-bacitracin-polymyxin, nystatin, OLANZapine, ondansetron **OR** ondansetron (ZOFRAN) IV, oxyCODONE, sodium chloride  DVT prophylaxis: Lovenox Code Status: Full code Family Communication: Discussed with patient, no family at bedside Patient admitted from: Group home Anticipated d/c place: Group home, patient  however wants to change group homes.  I have discussed with social worker today, we are unable to place to a different group home and she will need to return to her old one and then apply to change group homes from there Barriers to d/c: Persistent symptoms, general surgery input  Consultants:  Gastroenterology General surgery  Procedures:  EGD 1/16  Microbiology  SARS-CoV-2 1/14-negative  Antimicrobials: None   Objective: Vitals:   07/13/19 0500 07/13/19 0525 07/13/19 0933 07/13/19 0937  BP:  (!) 134/96    Pulse:  (!) 56    Resp:  17    Temp:  97.9 F (36.6 C)    TempSrc:  Oral    SpO2:  98% 97% 98%  Weight: 89.1 kg     Height:        Intake/Output Summary (Last 24 hours) at 07/13/2019 1220 Last data filed at 07/13/2019 1000 Gross per 24 hour  Intake 1960 ml  Output 4051 ml  Net -2091 ml   Filed Weights   07/11/19 1257 07/12/19 0500 07/13/19 0500  Weight: 88.1 kg 88 kg 89.1 kg    Examination:  Constitutional: NAD Eyes: no scleral icterus ENMT: Mucous membranes are moist.  Neck: normal, supple Respiratory: clear to auscultation bilaterally, no wheezing, no crackles. Normal respiratory effort. No accessory muscle use.  Cardiovascular: Regular rate and rhythm, no murmurs / rubs / gallops. No LE edema. Good peripheral pulses Abdomen: non distended, mild tenderness to palpation, minimal ventral hernia noted, bowel sounds positive Musculoskeletal: no clubbing / cyanosis.  Skin: no rashes Neurologic: CN 2-12 grossly intact. Strength 5/5 in all 4.  Psychiatric: Normal judgment and insight. Alert and oriented x 3. Normal mood.    Data Reviewed: I have independently reviewed following labs and imaging studies   CBC: Recent Labs  Lab 07/09/19 0725 07/10/19 0259  WBC 4.1 4.4  NEUTROABS 2.5  --   HGB 12.6 11.7*  HCT 37.0 34.4*  MCV 90.2 89.8  PLT 167 [id number]   Basic Metabolic Panel: Recent Labs  Lab 07/09/19 1700 07/10/19 0259 07/12/19 0521 07/12/19 1227  07/13/19 0740  NA 125* 125* 128* 127* 130*  K 5.3* 5.0 4.7 5.2* 5.3*  CL 88* 90* 94* 94* 93*  CO2 25 28 27 28 30   GLUCOSE 177* 132* 131* 103* 77  BUN 15 23* 16 14 8   CREATININE 0.95 1.40* 0.58 0.59 0.61  CALCIUM 8.5* 8.3* 7.9* 8.4* 8.7*   Liver Function Tests: Recent Labs  Lab 07/09/19 0725  AST 18  ALT 12  ALKPHOS 65  BILITOT 0.3  PROT 6.7  ALBUMIN 2.8*   Coagulation Profile: Recent Labs  Lab 07/09/19 0725  INR 0.9   HbA1C: No results for input(s): HGBA1C in the last 72 hours. CBG: No results for input(s): GLUCAP in the last 168 hours.  Recent Results (from the past 240 hour(s))  SARS CORONAVIRUS 2 (TAT 6-24 HRS) Nasopharyngeal Nasopharyngeal Swab     Status: None   Collection Time: 07/09/19 10:27 AM   Specimen: Nasopharyngeal Swab  Result Value Ref Range Status   SARS Coronavirus 2 NEGATIVE NEGATIVE Final    Comment: (NOTE) SARS-CoV-2 target nucleic acids  are NOT DETECTED. The SARS-CoV-2 RNA is generally detectable in upper and lower respiratory specimens during the acute phase of infection. Negative results do not preclude SARS-CoV-2 infection, do not rule out co-infections with other pathogens, and should not be used as the sole basis for treatment or other patient management decisions. Negative results must be combined with clinical observations, patient history, and epidemiological information. The expected result is Negative. Fact Sheet for Patients: SugarRoll.be Fact Sheet for Healthcare Providers: https://www.woods-mathews.com/ This test is not yet approved or cleared by the [place] FDA and  has been authorized for detection and/or diagnosis of SARS-CoV-2 by FDA under an Emergency Use Authorization (EUA). This EUA will remain  in effect (meaning this test can be used) for the duration of the COVID-19 declaration under Section 56 4(b)(1) of the Act, 21 U.S.C. section 360bbb-3(b)(1), unless the  authorization is terminated or revoked sooner. Performed at [facility], [place] [address], [place], [place] [address]      Radiology Studies: No results found.  Marzetta Board, MD, PhD [facility]  Between 7 am - 7 pm I am available, please contact me via Amion or Securechat  Between 7 pm - 7 am I am not available, please contact night coverage MD/APP via Amion

## 2019-07-14 ENCOUNTER — Other Ambulatory Visit: Payer: Self-pay | Admitting: Physician Assistant

## 2019-07-14 DIAGNOSIS — I5032 Chronic diastolic (congestive) heart failure: Secondary | ICD-10-CM

## 2019-07-14 DIAGNOSIS — F411 Generalized anxiety disorder: Secondary | ICD-10-CM

## 2019-07-14 LAB — CBC
HCT: 36.8 % (ref 36.0–46.0)
Hemoglobin: 12 g/dL (ref 12.0–15.0)
MCH: 30 pg (ref 26.0–34.0)
MCHC: 32.6 g/dL (ref 30.0–36.0)
MCV: 92 fL (ref 80.0–100.0)
Platelets: 206 10*3/uL (ref 150–400)
RBC: 4 MIL/uL (ref 3.87–5.11)
RDW: 13.1 % (ref 11.5–15.5)
WBC: 5.5 10*3/uL (ref 4.0–10.5)
nRBC: 0 % (ref 0.0–0.2)

## 2019-07-14 LAB — COMPREHENSIVE METABOLIC PANEL
ALT: 14 U/L (ref 0–44)
AST: 12 U/L — ABNORMAL LOW (ref 15–41)
Albumin: 2.4 g/dL — ABNORMAL LOW (ref 3.5–5.0)
Alkaline Phosphatase: 77 U/L (ref 38–126)
Anion gap: 7 (ref 5–15)
BUN: 18 mg/dL (ref 6–20)
CO2: 30 mmol/L (ref 22–32)
Calcium: 8.2 mg/dL — ABNORMAL LOW (ref 8.9–10.3)
Chloride: 92 mmol/L — ABNORMAL LOW (ref 98–111)
Creatinine, Ser: 0.84 mg/dL (ref 0.44–1.00)
GFR calc Af Amer: >60 mL/min (ref >60)
GFR calc non Af Amer: >60 mL/min (ref >60)
Glucose, Bld: 98 mg/dL (ref 70–99)
Potassium: 5.6 mmol/L — ABNORMAL HIGH (ref 3.5–5.1)
Sodium: 129 mmol/L — ABNORMAL LOW (ref 135–145)
Total Bilirubin: 0.2 mg/dL — ABNORMAL LOW (ref 0.3–1.2)
Total Protein: 5.4 g/dL — ABNORMAL LOW (ref 6.5–8.1)

## 2019-07-14 LAB — SURGICAL PATHOLOGY

## 2019-07-14 LAB — SARS CORONAVIRUS 2 (TAT 6-24 HRS): SARS Coronavirus 2: NEGATIVE

## 2019-07-14 MED ORDER — OXYCODONE HCL 5 MG PO TABS
5.0000 mg | ORAL_TABLET | Freq: Four times a day (QID) | ORAL | Status: DC | PRN
  Administered 2019-07-14 – 2019-07-17 (×9): 5 mg via ORAL
  Filled 2019-07-14 (×9): qty 1

## 2019-07-14 NOTE — Progress Notes (Signed)
Emergency Contact(s)  Name Relation Home Work [place], [place] Legal [phone]   I called all three numbers and got voice mails for each.  Unable to talk to legal guardian at this time.

## 2019-07-14 NOTE — Progress Notes (Addendum)
Daily Rounding Note  07/14/2019, 9:53 AM  LOS: 5 days   SUBJECTIVE:   Chief complaint: Chronic abdominal pain and nausea. She is tolerating full liquids, consuming 100% of these.  After consultation with RD, patient expressed desire to resume her usual regular type diet, she is currently on dysphagia 2 diet.  She continues to obsess with abdominal pain, the hernia in her abdomen, some nausea.  Toradol does not seem to have helped.   Wants t know why female MD with brown hair ("my doctor") has not seen her.     OBJECTIVE:         Vital signs in last 24 hours:    Temp:  [97.7 F (36.5 C)-98.7 F (37.1 C)] 97.7 F (36.5 C) (01/19 0550) Pulse Rate:  [51-58] 55 (01/19 0915) Resp:  [15-19] 15 (01/19 0550) BP: (123-199)/(40-90) 199/49 (01/19 0915) SpO2:  [96 %-99 %] 96 % (01/19 0830) Weight:  [90.1 kg] 90.1 kg (01/19 0422) Last BM Date: 07/12/19 Filed Weights   07/12/19 0500 07/13/19 0500 07/14/19 0422  Weight: 88 kg 89.1 kg 90.1 kg   General: not acutely ill.  Sitting up quietly but immediately launced into multiple complaints.   Heart: RRR Chest: clear Abdomen: soft, minor diffuse tenderness.    Extremities: no CCE Neuro/Psych:  Pressured speech.  Moves all 4s.    Intake/Output from previous day: 01/18 0701 - 01/19 0700 In: 1120 [P.O.:1120] Out: 1900 [Urine:1900]  Intake/Output this shift: Total I/O In: 10 [I.V.:10] Out: -   Lab Results: Recent Labs    07/14/19 0734  WBC 5.5  HGB 12.0  HCT 36.8  PLT 206   BMET Recent Labs    07/12/19 1227 07/13/19 0740 07/14/19 0734  NA 127* 130* 129*  K 5.2* 5.3* 5.6*  CL 94* 93* 92*  CO2 28 30 30   GLUCOSE 103* 77 98  BUN 14 8 18   CREATININE 0.59 0.61 0.84  CALCIUM 8.4* 8.7* 8.2*   LFT Recent Labs    07/14/19 0734  PROT 5.4*  ALBUMIN 2.4*  AST 12*  ALT 14  ALKPHOS 77  BILITOT 0.2*     Scheduled Meds: . aspirin  81 mg Oral Daily  . buPROPion   150 mg Oral Daily  . citalopram  20 mg Oral QHS  . clonazePAM  1 mg Oral TID  . docusate sodium  100 mg Oral BID  . enoxaparin (LOVENOX) injection  40 mg Subcutaneous Q24H  . famotidine  20 mg Oral BID  . feeding supplement (ENSURE ENLIVE)  237 mL Oral Q24H  . fluticasone  2 spray Each Nare BID  . fluticasone furoate-vilanterol  1 puff Inhalation Daily  . gabapentin  100 mg Oral BID  . guaiFENesin  1,200 mg Oral Daily  . hydrALAZINE  25 mg Oral Q8H  . hydrOXYzine  50 mg Oral TID  . ipratropium-albuterol  3 mL Nebulization TID  . ketorolac  30 mg Intravenous Q6H  . loratadine  10 mg Oral Daily  . lurasidone  20 mg Oral BID  . Melatonin  6 mg Oral QHS  . metoprolol tartrate  25 mg Oral BID  . nicotine  7 mg Transdermal Daily  . NIFEdipine  90 mg Oral Daily  . OLANZapine  5 mg Oral BID  . pantoprazole  40 mg Oral BID AC  . polyethylene glycol  17 g Oral Daily  . sodium chloride flush  3 mL Intravenous Q12H  .  valproic acid  500 mg Oral TID   Continuous Infusions: PRN Meds:.acetaminophen **OR** acetaminophen, albuterol, alum & mag hydroxide-simeth, benzonatate, calcium carbonate, clonazePAM, hydrALAZINE, loperamide, neomycin-bacitracin-polymyxin, nystatin, OLANZapine, ondansetron **OR** ondansetron (ZOFRAN) IV, oxyCODONE, sodium chloride   ASSESMENT:   *    Chronic abdominal pain and nausea. Gastritis and HH per 07/11/2019 EGD. Small umbilical hernia per previous CT scans.  Diastases recti. Surgery consult feels periumbilical hernia is unlikely cause of pain.  *   Mental illness and significant cognitive issues.  *    Hyponatremia, psychogenic polydipsia.   PLAN   *   No further GI or radiologic studies planned.  Unfortunately not much to offer.   Does not require outpt GI fup.   ? If meds adjustment may help?, note Psych consult is ordered.    *  K Pad and advance to soft, 2 liter fluid restricted diet.     Natalie Thomas  07/14/2019, 9:53 AM Phone [phone]  [phone]   Agree with Ms. Natalie Thomas assessment and plan. We are available if needed but will stop rounding Natalie Mayer, MD, [facility]

## 2019-07-14 NOTE — Progress Notes (Signed)
Patient ID: Natalie Thomas, female   DOB: Jan 15, 1961, 59 y.o.   MRN: [id number]  PROGRESS NOTE    Natalie Thomas  [id number] DOB: 04-22-1961 DOA: 07/09/2019 PCP: System, Pcp Not In   Brief Narrative:  59 year old female with history of bipolar/schizoaffective disorder, hypertension, hyperlipidemia, diabetes mellitus type 2, COPD, chronic hypoxic respiratory failure on 2 L oxygen via nasal cannula, chronic hyponatremia with recurrent hospitalizations for the same, chronic diastolic CHF, chronic abdominal pain presented to the hospital again with epigastric/abdominal pain along with nausea and dry heaves.  She also has a history of abdominal hernia.  She was found to be hyponatremic in the ED with a sodium of 118.  During the hospitalization, nephrology evaluated the patient and subsequently signed off with recommendations of fluid restriction.  GI and general surgery were also consulted for abdominal pain and hernia.  She underwent EGD on 07/11/2019 which showed diffuse gastritis and small hiatal hernia.  Assessment & Plan:   Hyponatremia Chronic diastolic CHF -At home, she is on Lasix and sodium chloride tablets along with demeclocycline -Her poor p.o. 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  Sodium 129 this morning.  Monitor  Chronic abdominal pain/GERD -Underwent multiple CT scans in the past which showed ventral hernia containing fat and on occasions small loops of small bowel without incarceration -GI following: Underwent EGD on 07/11/2019 which showed diffuse gastritis and small hiatal hernia.  Biopsies pending.  Continue famotidine and Protonix.  GI is advancing diet  although patient continues to have persistent symptoms.  She complains of abdominal pain right at the hernia site.  General surgery is also following. -GI recommended psych evaluation to see if her symptoms are psychogenic in pressure.  I have consulted psychiatry for the same.  COPD Chronic hypoxic respiratory failure -Stable.  Continue nebs and inhalers.  Outpatient follow-up  Essential hypertension -Blood pressure intermittently on the higher side.  Continue metoprolol, hydralazine, nifedipine.  Schizoaffective disorder/bipolar type -Continue Wellbutrin, Klonopin, hydroxyzine, Zyprexa, valproic acid and Latuda.  Will request psychiatry consultation.  Obesity -Outpatient follow-up  Generalized deconditioning -PT eval  DVT prophylaxis: Lovenox Code Status: Full Family Communication: Spoke to patient at bedside.  Called legal guardian on phone but she did not pick up Disposition Plan:Group home, patient however wants to change group homes.  Prior hospitalist discussed with social worker, we are unable to place to a different group home and she will need to return to her old one and then apply to change group homes from there  Consultants: GI/general surgery  Procedures:  EGD on 07/11/2019: Scattered gastritis, biopsied.  Small hiatal hernia  Antimicrobials:  None  Subjective: Patient seen and examined at bedside.  Very poor historian.  Still continues to complain of severe abdominal pain intermittently.  Does not feel that she is ready to be discharged; states that she does not have a place to go.  No overnight fever or vomiting reported.  Objective: Vitals:   07/14/19 0550 07/14/19 0830 07/14/19 0915 07/14/19 1000  BP: (!) 146/90  (!) 199/49 (!) 186/76  Pulse: (!) 51  (!) 55 (!) 57  Resp: 15     Temp: 97.7 F (36.5 C)     TempSrc:  Oral     SpO2: 96% 96%    Weight:      Height:        Intake/Output Summary (Last 24 hours) at 07/14/2019 1104 Last data filed at 07/14/2019  0927 Gross per 24 hour  Intake 1250 ml  Output 1300 ml  Net -50 ml   Filed Weights   07/12/19 0500 07/13/19 0500 07/14/19 0422  Weight: 88 kg 89.1 kg 90.1 kg    Examination:  General exam: Extremely poor historian.  Awake.  Does not participate in conversation much and complains of abdominal pain  respiratory system: Bilateral decreased breath sounds at bases with some scattered crackles  cardiovascular system: S1 & S2 heard, bradycardic intermittently Gastrointestinal system: Abdomen is nondistended, soft and mild diffuse tenderness. Normal bowel sounds heard. Extremities: No cyanosis, clubbing; trace lower extremity edema Central nervous system: Awake, extremely poor historian. No focal neurological deficits. Moving extremities Skin: No rashes, lesions or ulcers Psychiatry: Pressured speech.   Data Reviewed: I have personally reviewed following labs and imaging studies  CBC: Recent Labs  Lab 07/09/19 0725 07/10/19 0259 07/14/19 0734  WBC 4.1 4.4 5.5  NEUTROABS 2.5  --   --   HGB 12.6 11.7* 12.0  HCT 37.0 34.4* 36.8  MCV 90.2 89.8 92.0  PLT 167 191 [id number]   Basic Metabolic Panel: Recent Labs  Lab 07/10/19 0259 07/12/19 0521 07/12/19 1227 07/13/19 0740 07/14/19 0734  NA 125* 128* 127* 130* 129*  K 5.0 4.7 5.2* 5.3* 5.6*  CL 90* 94* 94* 93* 92*  CO2 28 27 28 30 30   GLUCOSE 132* 131* 103* 77 98  BUN 23* 16 14 8 18   CREATININE 1.40* 0.58 0.59 0.61 0.84  CALCIUM 8.3* 7.9* 8.4* 8.7* 8.2*   GFR: Estimated Creatinine Clearance: 81.9 mL/min (by C-G formula based on SCr of 0.84 mg/dL). Liver Function Tests: Recent Labs  Lab 07/09/19 0725 07/14/19 0734  AST 18 12*  ALT 12 14  ALKPHOS 65 77  BILITOT 0.3 0.2*  PROT 6.7 5.4*  ALBUMIN 2.8* 2.4*   Recent Labs  Lab 07/09/19 0725  LIPASE 26   No results for input(s): AMMONIA in the last 168 hours. Coagulation Profile: Recent Labs  Lab 07/09/19 0725  INR 0.9   Cardiac Enzymes: No results for input(s):  CKTOTAL, CKMB, CKMBINDEX, TROPONINI in the last 168 hours. BNP (last 3 results) No results for input(s): PROBNP in the last 8760 hours. HbA1C: No results for input(s): HGBA1C in the last 72 hours. CBG: No results for input(s): GLUCAP in the last 168 hours. Lipid Profile: No results for input(s): CHOL, HDL, LDLCALC, TRIG, CHOLHDL, LDLDIRECT in the last 72 hours. Thyroid Function Tests: No results for input(s): TSH, T4TOTAL, FREET4, T3FREE, THYROIDAB in the last 72 hours. Anemia Panel: No results for input(s): VITAMINB12, FOLATE, FERRITIN, TIBC, IRON, RETICCTPCT in the last 72 hours. Sepsis Labs: No results for input(s): PROCALCITON, LATICACIDVEN in the last 168 hours.  Recent Results (from the past 240 hour(s))  SARS CORONAVIRUS 2 (TAT 6-24 HRS) Nasopharyngeal Nasopharyngeal Swab     Status: None   Collection Time: 07/09/19 10:27 AM   Specimen: Nasopharyngeal Swab  Result Value Ref Range Status   SARS Coronavirus 2 NEGATIVE NEGATIVE Final    Comment: (NOTE) SARS-CoV-2 target nucleic acids are NOT DETECTED. The SARS-CoV-2 RNA is generally detectable in upper and lower respiratory specimens during the acute phase of infection. Negative results do not preclude SARS-CoV-2 infection, do not rule out co-infections with other pathogens, and  should not be used as the sole basis for treatment or other patient management decisions. Negative results must be combined with clinical observations, patient history, and epidemiological information. The expected result is Negative. Fact Sheet for Patients: SugarRoll.be Fact Sheet for Healthcare Providers: https://www.woods-mathews.com/ This test is not yet approved or cleared by the [place] FDA and  has been authorized for detection and/or diagnosis of SARS-CoV-2 by FDA under an Emergency Use Authorization (EUA). This EUA will remain  in effect (meaning this test can be used) for the duration of  the COVID-19 declaration under Section 56 4(b)(1) of the Act, 21 U.S.C. section 360bbb-3(b)(1), unless the authorization is terminated or revoked sooner. Performed at [facility], [place] [address], [place], [place] [address]          Radiology Studies: No results found.      Scheduled Meds: . aspirin  81 mg Oral Daily  . buPROPion  150 mg Oral Daily  . citalopram  20 mg Oral QHS  . clonazePAM  1 mg Oral TID  . docusate sodium  100 mg Oral BID  . enoxaparin (LOVENOX) injection  40 mg Subcutaneous Q24H  . famotidine  20 mg Oral BID  . feeding supplement (ENSURE ENLIVE)  237 mL Oral Q24H  . fluticasone  2 spray Each Nare BID  . fluticasone furoate-vilanterol  1 puff Inhalation Daily  . gabapentin  100 mg Oral BID  . guaiFENesin  1,200 mg Oral Daily  . hydrALAZINE  25 mg Oral Q8H  . hydrOXYzine  50 mg Oral TID  . ipratropium-albuterol  3 mL Nebulization TID  . ketorolac  30 mg Intravenous Q6H  . loratadine  10 mg Oral Daily  . lurasidone  20 mg Oral BID  . Melatonin  6 mg Oral QHS  . metoprolol tartrate  25 mg Oral BID  . nicotine  7 mg Transdermal Daily  . NIFEdipine  90 mg Oral Daily  . OLANZapine  5 mg Oral BID  . pantoprazole  40 mg Oral BID AC  . polyethylene glycol  17 g Oral Daily  . sodium chloride flush  3 mL Intravenous Q12H  . valproic acid  500 mg Oral TID   Continuous Infusions:        Aline August, MD [facility] 07/14/2019, 11:04 AM

## 2019-07-14 NOTE — TOC Progression Note (Signed)
Transition of Care [facility]) - Progression Note    Patient Details  Name: Natalie Thomas MRN: [id number] Date of Birth: January 09, 1961  Transition of Care [facility]) CM/SW [place], [place] Phone Number:  07/14/2019, 12:25 PM  Clinical Narrative:    CSW spoke with MD, pt likely stable for discharge tomorrow. Called and f/u with pt guardian [name] [phone]). She states plan is for pt to return to [facility]. Pt has preference to go to another facility however pt guardian states that currently is not an option. CSW called and spoke with Cindee Lame at [facility]. Tomorrow they will need new Fl2 and dc summary with medication and diet recommendations faxed to [phone].   If anyone from care team calls pt guardian please leave message with call back number if you get voicemail.    Expected Discharge Plan: Memory Care Barriers to Discharge: Continued Medical Work up  Expected Discharge Plan and Services Expected Discharge Plan: Memory Care In-house Referral: Clinical Social Work Discharge Planning Services: CM Consult Post Acute Care Choice: Resumption of Svcs/PTA Provider Living arrangements for the past 2 months: Assisted Living Facility(Memory Care)   Readmission Risk Interventions Readmission Risk Prevention Plan 07/10/2019 07/01/2019 03/10/2019  Transportation Screening Complete Complete Complete  Medication Review (RN Care Manager) Referral to Pharmacy Referral to Pharmacy Complete  PCP or Specialist appointment within 3-5 days of discharge Complete Complete Complete  HRI or [place] Not Complete Not Complete Complete  [place] or Home Care Consult Pt Refusal Comments pt is resident of [facility] No orders yet, patient is from [place] Care/Counseling Consult Complete Complete Complete  Palliative Care Screening Not Applicable Not Applicable Not [place] Not Applicable Not Applicable  Complete  Some recent data might be hidden

## 2019-07-14 NOTE — Consult Note (Signed)
[facility] Face-to-Face Psychiatry Consult   Reason for Consult:  Medication adjustments Referring Physician:  Dr Roosevelt Locks Patient Identification: Natalie Thomas MRN:  [id number] Principal Diagnosis: Hyponatremia Diagnosis:  Principal Problem:   Hyponatremia Active Problems:   Schizoaffective disorder, bipolar type (HCC)   COPD (chronic obstructive pulmonary disease) (HCC)   GERD   Acute abdominal pain   Acute diastolic CHF (congestive heart failure) (HCC)   Ventral hernia   Acute gastritis   Abdominal pain, chronic, epigastric   Nausea   Psychogenic polydipsia  Total Time spent with patient: 1 hour  Subjective:   Natalie Thomas is a 59 y.o. female patient admitted with hyponatremia.  Patient seen and evaluated in person by this provider.  Consult placed for hyponatremia and medication adjustments to assist, long history of bipolar disorder.  Patient is on multiple medications at this time and resides in a assisted living.  Her main focus was trying to get her Xanax and Valium back versus the Klonopin.  Explained to her the reason Klonopin was prescribed versus the others as it has a longer period of affect and safer in her age group.  No suicidal/homicidal ideations, hallucinations, or substance abuse.  She does report that she does not want to go back to her assisted living but her guardian is insisting that she does.  Psychiatrically clear.  HPI per MD: Natalie Thomas a 59 y.o.femalewith medical history significant ofbipolar/schizoaffective disorder, HTN, HLD, DM, COPDon 2Loxygen, chronic hyponatremia anddiastolicCHF.She presents with complaints of epigastric abdominal pain. Complains of having gagging and dry heaves which further worsens her abdominal pain. Believe symptoms are related with her abdominal hernia. Patient reports that she likes to drink sodas and ice water, but cannot quantify how much of this she is eating. Other associated symptoms include difficulty  breathing, chronic productive cough, constipation, and chest pain. Denies having any fever or dysuria symptoms. She reports that her last bowel movement was yesterday patient just recently admitted on 1/5-1/11 with similar abdominal pain complaints found to have sodium of 121.  Patient was put on fluid restriction with improvement in sodium and given stool softeners with improvement in symptoms.   Past Psychiatric History: schizoaffective d/o  Risk to Self:  none Risk to Others:  none Prior Inpatient Therapy:  multiple Prior Outpatient Therapy:  yes  Past Medical History:  Past Medical History:  Diagnosis Date  . Anemia 10/16/2017  . Anxiety and depression   . CHF (congestive heart failure) ([place])   . Chronic hyponatremia 04/08/2007   Qualifier: Diagnosis of  By: Marca Ancona RMA, Lucy    . Closed comminuted intertrochanteric fracture of right femur ([place]) 11/07/2017  . COPD 04/08/2007   Qualifier: Diagnosis of  By: Marca Ancona RMA, Lucy    . Depression   . Diabetes mellitus, type 2 ([place])    pt denies but states that she has been treated for DM  . Essential hypertension 04/08/2007   Qualifier: Diagnosis of  By: Marca Ancona RMA, Lucy    . GERD (gastroesophageal reflux disease)   . Hyperlipidemia 10/07/2017  . Lumbar disc disease 04/09/2011  . MENOPAUSE, PREMATURE 04/08/2007   Qualifier: Diagnosis of  By: Marca Ancona RMA, Lucy    . 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 Osteoporosis 04/08/2007   Qualifier: Diagnosis of  By: Reatha Armour, Lucy    . Polycythemia secondary to smoking 04/09/2011  . 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 Schizoaffective disorder, bipolar type ([place]) 07/05/2011  Past Surgical History:  Procedure Laterality Date  . BIOPSY  07/11/2019   Procedure: BIOPSY;  Surgeon: Jerene Bears, MD;  Location: [facility];  Service: Gastroenterology;;  . ESOPHAGOGASTRODUODENOSCOPY (EGD) WITH PROPOFOL N/A 07/11/2019   Procedure:  ESOPHAGOGASTRODUODENOSCOPY (EGD) WITH PROPOFOL;  Surgeon: Jerene Bears, MD;  Location: [place];  Service: Gastroenterology;  Laterality: N/A;  . EYE SURGERY     on left  eye, pt states that she sees bad out of the right eye and needs surgery there  . INTRAMEDULLARY (IM) NAIL INTERTROCHANTERIC Right 11/13/2017   Procedure: INTRAMEDULLARY (IM) NAIL INTERTROCHANTRIC;  Surgeon: Shona Needles, MD;  Location: [facility];  Service: Orthopedics;  Laterality: Right;  . PELVIC FRACTURE SURGERY     Family History: History reviewed. No pertinent family history. Family Psychiatric  History: none Social History:  Social History   Substance and Sexual Activity  Alcohol Use No     Social History   Substance and Sexual Activity  Drug Use No    Social History   Socioeconomic History  . Marital status: Divorced    Spouse name: Not on file  . Number of children: Not on file  . Years of education: Not on file  . Highest education level: Not on file  Occupational History  . Occupation: Disabled  Tobacco Use  . Smoking status: Current Every Day Smoker    Packs/day: 0.25    Types: Cigarettes  . Smokeless tobacco: Never Used  Substance and Sexual Activity  . Alcohol use: No  . Drug use: No  . Sexual activity: Never  Other Topics Concern  . Not on file  Social History Narrative   single   Social Determinants of Health   Financial Resource Strain:   . Difficulty of Paying Living Expenses: Not on file  Food Insecurity:   . Worried About Charity fundraiser in the Last Year: Not on file  . Ran Out of Food in the Last Year: Not on file  Transportation Needs:   . Lack of Transportation (Medical): Not on file  . Lack of Transportation (Non-Medical): Not on file  Physical Activity:   . Days of Exercise per Week: Not on file  . Minutes of Exercise per Session: Not on file  Stress:   . Feeling of Stress : Not on file  Social Connections:   . Frequency of Communication with Friends and  Family: Not on file  . Frequency of Social Gatherings with Friends and Family: Not on file  . Attends Religious Services: Not on file  . Active Member of Clubs or Organizations: Not on file  . Attends Archivist Meetings: Not on file  . Marital Status: Not on file   Additional Social History:    Allergies:   Allergies  Allergen Reactions  . Clarithromycin Itching  . Penicillins Itching    Has tolerated cefazolin before  Has patient had a PCN reaction causing immediate rash, facial/tongue/throat swelling, SOB or lightheadedness with hypotension: No Has patient had a PCN reaction causing severe rash involving mucus membranes or skin necrosis: No Has patient had a PCN reaction that required hospitalization: Unknown Has patient had a PCN reaction occurring within the last 10 years: No If all of the above answers are "NO", then may proceed with Cephalosporin use.     Labs:  Results for orders placed or performed during the hospital encounter of 07/09/19 (from the past 48 hour(s))  Basic metabolic panel     Status: Abnormal  Collection Time: 07/13/19  7:40 AM  Result Value Ref Range   Sodium 130 (L) 135 - 145 mmol/L   Potassium 5.3 (H) 3.5 - 5.1 mmol/L   Chloride 93 (L) 98 - 111 mmol/L   CO2 30 22 - 32 mmol/L   Glucose, Bld 77 70 - 99 mg/dL   BUN 8 6 - 20 mg/dL   Creatinine, Ser 0.61 0.44 - 1.00 mg/dL   Calcium 8.7 (L) 8.9 - 10.3 mg/dL   GFR calc non Af Amer >60 >60 mL/min   GFR calc Af Amer >60 >60 mL/min   Anion gap 7 5 - 15    Comment: Performed at [facility] [address], [place], [place] [address]  Comprehensive metabolic panel     Status: Abnormal   Collection Time: 07/14/19  7:34 AM  Result Value Ref Range   Sodium 129 (L) 135 - 145 mmol/L   Potassium 5.6 (H) 3.5 - 5.1 mmol/L   Chloride 92 (L) 98 - 111 mmol/L   CO2 30 22 - 32 mmol/L   Glucose, Bld 98 70 - 99 mg/dL   BUN 18 6 - 20 mg/dL   Creatinine, Ser 0.84 0.44 - 1.00 mg/dL   Calcium 8.2  (L) 8.9 - 10.3 mg/dL   Total Protein 5.4 (L) 6.5 - 8.1 g/dL   Albumin 2.4 (L) 3.5 - 5.0 g/dL   AST 12 (L) 15 - 41 U/L   ALT 14 0 - 44 U/L   Alkaline Phosphatase 77 38 - 126 U/L   Total Bilirubin 0.2 (L) 0.3 - 1.2 mg/dL   GFR calc non Af Amer >60 >60 mL/min   GFR calc Af Amer >60 >60 mL/min   Anion gap 7 5 - 15    Comment: Performed at [facility], [place] [address], [place], [place] [address]  CBC     Status: None   Collection Time: 07/14/19  7:34 AM  Result Value Ref Range   WBC 5.5 4.0 - 10.5 K/uL   RBC 4.00 3.87 - 5.11 MIL/uL   Hemoglobin 12.0 12.0 - 15.0 g/dL   HCT 36.8 36.0 - 46.0 %   MCV 92.0 80.0 - 100.0 fL   MCH 30.0 26.0 - 34.0 pg   MCHC 32.6 30.0 - 36.0 g/dL   RDW 13.1 11.5 - 15.5 %   Platelets 206 150 - 400 K/uL   nRBC 0.0 0.0 - 0.2 %    Comment: Performed at [facility], [place] [address] [place], [place] [address]    Current Facility-Administered Medications  Medication Dose Route Frequency Provider Last Rate Last Admin  . acetaminophen (TYLENOL) tablet 650 mg  650 mg Oral Q6H PRN Fuller Plan A, MD   650 mg at 07/14/19 1535   Or  . acetaminophen (TYLENOL) suppository 650 mg  650 mg Rectal Q6H PRN Smith, Rondell A, MD      . albuterol (PROVENTIL) (2.5 MG/3ML) 0.083% nebulizer solution 2.5 mg  2.5 mg Nebulization Q4H PRN Smith, Rondell A, MD      . alum & mag hydroxide-simeth (MAALOX/MYLANTA) 200-200-20 MG/5ML suspension 15 mL  15 mL Oral Q6H PRN Fuller Plan A, MD   15 mL at 07/14/19 1650  . aspirin chewable tablet 81 mg  81 mg Oral Daily Fuller Plan A, MD   81 mg at 07/14/19 0917  . benzonatate (TESSALON) capsule 100 mg  100 mg Oral Q8H PRN Fuller Plan A, MD   100 mg at 07/13/19  1418  . buPROPion (WELLBUTRIN XL) 24 hr tablet 150 mg  150 mg Oral Daily Tamala Julian, Rondell A, MD   150 mg at 07/14/19 0916  . calcium carbonate (TUMS - dosed in mg elemental calcium) chewable tablet 500 mg  500 mg Oral Q12H PRN Smith, Rondell A, MD      . citalopram (CELEXA)  tablet 20 mg  20 mg Oral QHS Smith, Rondell A, MD   20 mg at 07/13/19 2234  . clonazePAM (KLONOPIN) tablet 0.5 mg  0.5 mg Oral BID PRN Fuller Plan A, MD   0.5 mg at 07/13/19 1311  . clonazePAM (KLONOPIN) tablet 1 mg  1 mg Oral TID Fuller Plan A, MD   1 mg at 07/14/19 1535  . docusate sodium (COLACE) capsule 100 mg  100 mg Oral BID Fuller Plan A, MD   100 mg at 07/14/19 0916  . enoxaparin (LOVENOX) injection 40 mg  40 mg Subcutaneous Q24H Vena Rua, PA-C   40 mg at 07/14/19 1615  . famotidine (PEPCID) tablet 20 mg  20 mg Oral BID Debbe Odea, MD   20 mg at 07/14/19 0916  . feeding supplement (ENSURE ENLIVE) (ENSURE ENLIVE) liquid 237 mL  237 mL Oral Q24H Smith, Rondell A, MD   237 mL at 07/14/19 1230  . fluticasone (FLONASE) 50 MCG/ACT nasal spray 2 spray  2 spray Each Nare BID Norval Morton, MD   2 spray at 07/14/19 0918  . fluticasone furoate-vilanterol (BREO ELLIPTA) 100-25 MCG/INH 1 puff  1 puff Inhalation Daily Fuller Plan A, MD   1 puff at 07/14/19 0828  . gabapentin (NEURONTIN) capsule 100 mg  100 mg Oral BID Fuller Plan A, MD   100 mg at 07/14/19 0917  . guaiFENesin (MUCINEX) 12 hr tablet 1,200 mg  1,200 mg Oral Daily Tamala Julian, Rondell A, MD   1,200 mg at 07/14/19 0917  . hydrALAZINE (APRESOLINE) injection 10 mg  10 mg Intravenous Q4H PRN Fuller Plan A, MD   10 mg at 07/14/19 0930  . hydrALAZINE (APRESOLINE) tablet 25 mg  25 mg Oral Q8H Roney Jaffe, MD   25 mg at 07/14/19 1158  . hydrOXYzine (ATARAX/VISTARIL) tablet 50 mg  50 mg Oral TID Fuller Plan A, MD   50 mg at 07/14/19 1614  . ipratropium-albuterol (DUONEB) 0.5-2.5 (3) MG/3ML nebulizer solution 3 mL  3 mL Nebulization TID Fuller Plan A, MD   3 mL at 07/14/19 1315  . loperamide (IMODIUM) capsule 2 mg  2 mg Oral Q6H PRN Fuller Plan A, MD      . loratadine (CLARITIN) tablet 10 mg  10 mg Oral Daily Tamala Julian, Rondell A, MD   10 mg at 07/14/19 0918  . lurasidone (LATUDA) tablet 20 mg  20 mg Oral BID Fuller Plan A, MD   20 mg at 07/14/19 0915  . Melatonin TABS 6 mg  6 mg Oral QHS Smith, Rondell A, MD   6 mg at 07/13/19 2234  . metoprolol tartrate (LOPRESSOR) tablet 25 mg  25 mg Oral BID Neila Gear, NP   Stopped at 07/14/19 0919  . neomycin-bacitracin-polymyxin (NEOSPORIN) ointment packet 1 application  1 application Topical PRN Smith, Rondell A, MD      . nicotine (NICODERM CQ - dosed in mg/24 hr) patch 7 mg  7 mg Transdermal Daily Fuller Plan A, MD   7 mg at 07/14/19 0918  . NIFEdipine (PROCARDIA XL/NIFEDICAL-XL) 24 hr tablet 90 mg  90 mg Oral Daily Smith, Rondell  A, MD   90 mg at 07/14/19 0917  . nystatin (MYCOSTATIN/NYSTOP) topical powder 1 application  1 application Topical BID PRN Fuller Plan A, MD      . OLANZapine (ZYPREXA) tablet 5 mg  5 mg Oral BID Fuller Plan A, MD   5 mg at 07/14/19 0916  . OLANZapine (ZYPREXA) tablet 5 mg  5 mg Oral Daily PRN Fuller Plan A, MD   5 mg at 07/13/19 1407  . ondansetron (ZOFRAN) tablet 4 mg  4 mg Oral Q6H PRN Debbe Odea, MD       Or  . ondansetron (ZOFRAN) injection 4 mg  4 mg Intravenous Q6H PRN Debbe Odea, MD   4 mg at 07/14/19 1645  . oxyCODONE (Oxy IR/ROXICODONE) immediate release tablet 5 mg  5 mg Oral Q6H PRN Aline August, MD   5 mg at 07/14/19 1614  . pantoprazole (PROTONIX) EC tablet 40 mg  40 mg Oral BID AC Debbe Odea, MD   40 mg at 07/14/19 1614  . polyethylene glycol (MIRALAX / GLYCOLAX) packet 17 g  17 g Oral Daily Fuller Plan A, MD   17 g at 07/14/19 0926  . sodium chloride (OCEAN) 0.65 % nasal spray 2 spray  2 spray Each Nare Q2H PRN Smith, Rondell A, MD      . sodium chloride flush (NS) 0.9 % injection 3 mL  3 mL Intravenous Q12H Smith, Rondell A, MD   3 mL at 07/14/19 [id number]  . valproic acid (DEPAKENE) 250 MG capsule 500 mg  500 mg Oral TID Norval Morton, MD   500 mg at 07/14/19 1615    Musculoskeletal: Strength & Muscle Tone: decreased Gait & Station: did not witness Patient leans: N/A  Psychiatric  Specialty Exam: Physical Exam  Nursing note and vitals reviewed. Constitutional: She is oriented to person, place, and time. She appears well-developed and well-nourished.  HENT:  Head: Normocephalic.  Respiratory: Effort normal.  Musculoskeletal:        General: Normal range of motion.     Cervical back: Normal range of motion.  Neurological: She is alert and oriented to person, place, and time.  Psychiatric: Her speech is normal and behavior is normal. Judgment and thought content normal. Her mood appears anxious. Cognition and memory are normal.    Review of Systems  Gastrointestinal: Positive for nausea.  Psychiatric/Behavioral: The patient is nervous/anxious.   All other systems reviewed and are negative.   Blood pressure (!) 190/63, pulse (!) 57, temperature 97.8 F (36.6 C), temperature source Oral, resp. rate 18, height 5\' 5"  (1.651 m), weight 90.1 kg, SpO2 96 %.Body mass index is 33.05 kg/m.  General Appearance: Casual  Eye Contact:  Good  Speech:  Normal Rate  Volume:  Normal  Mood:  Anxious  Affect:  Congruent  Thought Process:  Coherent and Descriptions of Associations: Intact  Orientation:  Full (Time, Place, and Person)  Thought Content:  WDL and Logical  Suicidal Thoughts:  No  Homicidal Thoughts:  No  Memory:  Immediate;   Good Recent;   Good Remote;   Good  Judgement:  Fair  Insight:  Fair  Psychomotor Activity:  Decreased  Concentration:  Concentration: Good and Attention Span: Good  Recall:  Good  Fund of Knowledge:  Good  Language:  Good  Akathisia:  No  Handed:  Right  AIMS (if indicated):     Assets:  Housing Leisure Time Resilience Social Support  ADL's:  Impaired  Cognition:  WNL  Sleep:      Patient seen and evaluated in person by this provider.  Consult placed for medication adjustment based on hyponatremia.  Patient has a long history of schizoaffective disorder bipolar type and on multiple medications.  Recommendations stated  below.  Treatment Plan Summary: Schizoaffective disorder, bipolar type: -discontinue Celexa 20 mg daily -Discontinued Gabapentin 100 mg BID -Continue Wellbutrin 150 mg daily -Continue Latuda 20 mg BID -Continue Depakote 500 mg TID -Continue Zyprexa 5 mg BID  -Discontinued Zyprexa 5 mg PRN  Anxiety: -Continue Klonopin 1 mg TID and 0.5 mg daily PRN  Disposition: No evidence of imminent risk to self or others at present.    Waylan Boga, NP 07/14/2019 5:10 PM

## 2019-07-14 NOTE — Plan of Care (Signed)
Patient states she is not yet ready for discharge. Problem: Education: Goal: Knowledge of General Education information will improve Description: Including pain rating scale, medication(s)/side effects and non-pharmacologic comfort measures Outcome: Progressing   Problem: Clinical Measurements: Goal: Ability to maintain clinical measurements within normal limits will improve Outcome: Progressing Goal: Will remain free from infection Outcome: Progressing Goal: Diagnostic test results will improve Outcome: Progressing Goal: Respiratory complications will improve Outcome: Progressing Goal: Cardiovascular complication will be avoided Outcome: Progressing   Problem: Activity: Goal: Risk for activity intolerance will decrease Outcome: Progressing   Problem: Nutrition: Goal: Adequate nutrition will be maintained Outcome: Progressing   Problem: Coping: Goal: Level of anxiety will decrease Outcome: Progressing   Problem: Elimination: Goal: Will not experience complications related to bowel motility Outcome: Progressing Goal: Will not experience complications related to urinary retention Outcome: Progressing   Problem: Pain Managment: Goal: General experience of comfort will improve Outcome: Progressing   Problem: Safety: Goal: Ability to remain free from injury will improve Outcome: Progressing   Problem: Skin Integrity: Goal: Risk for impaired skin integrity will decrease Outcome: Progressing

## 2019-07-15 DIAGNOSIS — R5381 Other malaise: Secondary | ICD-10-CM

## 2019-07-15 DIAGNOSIS — J42 Unspecified chronic bronchitis: Secondary | ICD-10-CM

## 2019-07-15 DIAGNOSIS — E871 Hypo-osmolality and hyponatremia: Secondary | ICD-10-CM

## 2019-07-15 DIAGNOSIS — K439 Ventral hernia without obstruction or gangrene: Secondary | ICD-10-CM

## 2019-07-15 DIAGNOSIS — K219 Gastro-esophageal reflux disease without esophagitis: Secondary | ICD-10-CM

## 2019-07-15 DIAGNOSIS — F250 Schizoaffective disorder, bipolar type: Secondary | ICD-10-CM

## 2019-07-15 DIAGNOSIS — R109 Unspecified abdominal pain: Secondary | ICD-10-CM

## 2019-07-15 DIAGNOSIS — K2950 Unspecified chronic gastritis without bleeding: Secondary | ICD-10-CM

## 2019-07-15 LAB — CBC WITH DIFFERENTIAL/PLATELET
Abs Immature Granulocytes: 0.01 10*3/uL (ref 0.00–0.07)
Basophils Absolute: 0 10*3/uL (ref 0.0–0.1)
Basophils Relative: 0 %
Eosinophils Absolute: 0.1 10*3/uL (ref 0.0–0.5)
Eosinophils Relative: 2 %
HCT: 35.9 % — ABNORMAL LOW (ref 36.0–46.0)
Hemoglobin: 12.1 g/dL (ref 12.0–15.0)
Immature Granulocytes: 0 %
Lymphocytes Relative: 29 %
Lymphs Abs: 1.4 10*3/uL (ref 0.7–4.0)
MCH: 30.5 pg (ref 26.0–34.0)
MCHC: 33.7 g/dL (ref 30.0–36.0)
MCV: 90.4 fL (ref 80.0–100.0)
Monocytes Absolute: 0.4 10*3/uL (ref 0.1–1.0)
Monocytes Relative: 9 %
Neutro Abs: 2.9 10*3/uL (ref 1.7–7.7)
Neutrophils Relative %: 60 %
Platelets: 210 10*3/uL (ref 150–400)
RBC: 3.97 MIL/uL (ref 3.87–5.11)
RDW: 12.8 % (ref 11.5–15.5)
WBC: 4.8 10*3/uL (ref 4.0–10.5)
nRBC: 0 % (ref 0.0–0.2)

## 2019-07-15 LAB — COMPREHENSIVE METABOLIC PANEL
ALT: 13 U/L (ref 0–44)
AST: 11 U/L — ABNORMAL LOW (ref 15–41)
Albumin: 2.6 g/dL — ABNORMAL LOW (ref 3.5–5.0)
Alkaline Phosphatase: 69 U/L (ref 38–126)
Anion gap: 8 (ref 5–15)
BUN: 18 mg/dL (ref 6–20)
CO2: 29 mmol/L (ref 22–32)
Calcium: 8.2 mg/dL — ABNORMAL LOW (ref 8.9–10.3)
Chloride: 84 mmol/L — ABNORMAL LOW (ref 98–111)
Creatinine, Ser: 0.87 mg/dL (ref 0.44–1.00)
GFR calc Af Amer: >60 mL/min (ref >60)
GFR calc non Af Amer: >60 mL/min (ref >60)
Glucose, Bld: 113 mg/dL — ABNORMAL HIGH (ref 70–99)
Potassium: 5.2 mmol/L — ABNORMAL HIGH (ref 3.5–5.1)
Sodium: 121 mmol/L — ABNORMAL LOW (ref 135–145)
Total Bilirubin: 0.5 mg/dL (ref 0.3–1.2)
Total Protein: 5.7 g/dL — ABNORMAL LOW (ref 6.5–8.1)

## 2019-07-15 LAB — MAGNESIUM: Magnesium: 1.9 mg/dL (ref 1.7–2.4)

## 2019-07-15 MED ORDER — FLUDROCORTISONE ACETATE 0.1 MG PO TABS
0.1000 mg | ORAL_TABLET | Freq: Every day | ORAL | Status: DC
  Administered 2019-07-15 – 2019-07-17 (×3): 0.1 mg via ORAL
  Filled 2019-07-15 (×3): qty 1

## 2019-07-15 MED ORDER — SUCRALFATE 1 GM/10ML PO SUSP
1.0000 g | Freq: Three times a day (TID) | ORAL | Status: DC
  Administered 2019-07-15 – 2019-07-17 (×9): 1 g via ORAL
  Filled 2019-07-15 (×9): qty 10

## 2019-07-15 NOTE — Plan of Care (Signed)
  Problem: Activity: Goal: Risk for activity intolerance will decrease Outcome: Progressing   Problem: Nutrition: Goal: Adequate nutrition will be maintained Outcome: Progressing   Problem: Coping: Goal: Level of anxiety will decrease Outcome: Progressing   Problem: Pain Managment: Goal: General experience of comfort will improve Outcome: Progressing   Problem: Elimination: Goal: Will not experience complications related to bowel motility Outcome: Progressing Goal: Will not experience complications related to urinary retention Outcome: Progressing   Problem: Safety: Goal: Ability to remain free from injury will improve Outcome: Progressing

## 2019-07-15 NOTE — Final Consult Note (Signed)
Consultant Final Sign-Off Note    Assessment/Final recommendations  Natalie Thomas is a 59 y.o. female followed by me for generalized abdominal pain with ventral hernia. Hernia is soft and reducible, patient non-obstructed. Gastritis and hiatal hernia seen on EGD 07/11/19.    Wound care (if applicable): N/A   Diet at discharge: per primary team   Activity at discharge: per primary team   Follow-up appointment:  CCS prn to discuss elective hernia repair    Pending results:  Unresulted Labs (From admission, onward)    Start     Ordered   07/16/19 0700  Cortisol-am, blood  Once,   R     07/15/19 0824   07/15/19 0824  Aldosterone + renin activity w/ ratio  Once,   R     07/15/19 [id number]           Medication recommendations: none    Other recommendations: none     Thank you for allowing [place] to participate in the care of your patient!  Please consult [place] again if you have further needs for your patient.  Claiborne Billings Rayburn 07/15/2019 9:38 AM    Subjective   Patient reports some upper abdominal pain. Reports some nausea but is chugging ensure while I was present. Having bowel function. Complains that hernia has not gotten any smaller. We discussed pathophysiology of hernias and indications for urgent/emergent hernia surgery. Patient does not have any indications for urgent/emergent surgical intervention.   Objective  Vital signs in last 24 hours: Temp:  [97.8 F (36.6 C)-98 F (36.7 C)] 97.8 F (36.6 C) (01/20 0603) Pulse Rate:  [50-66] 55 (01/20 0903) Resp:  [16-18] 17 (01/20 0603) BP: (132-190)/(54-76) 132/60 (01/20 0903) SpO2:  [94 %-100 %] 94 % (01/20 0830) FiO2 (%):  [28 %] 28 % (01/19 1133) Weight:  [93.2 kg] 93.2 kg (01/20 0603)  PE: General: alert, NAD Resp: normal effort GI: soft, ND, non-tender, reducible periumbilical hernia, +BS Skin: warm, dry, no rashes   Pertinent labs and Studies: Recent Labs    07/14/19 0734 07/15/19 0345  WBC 5.5 4.8  HGB 12.0  12.1  HCT 36.8 35.9*   BMET Recent Labs    07/14/19 0734 07/15/19 0345  NA 129* 121*  K 5.6* 5.2*  CL 92* 84*  CO2 30 29  GLUCOSE 98 113*  BUN 18 18  CREATININE 0.84 0.87  CALCIUM 8.2* 8.2*   No results for input(s): LABURIN in the last 72 hours. Results for orders placed or performed during the hospital encounter of 07/09/19  SARS CORONAVIRUS 2 (TAT 6-24 HRS) Nasopharyngeal Nasopharyngeal Swab     Status: None   Collection Time: 07/09/19 10:27 AM   Specimen: Nasopharyngeal Swab  Result Value Ref Range Status   SARS Coronavirus 2 NEGATIVE NEGATIVE Final    Comment: (NOTE) SARS-CoV-2 target nucleic acids are NOT DETECTED. The SARS-CoV-2 RNA is generally detectable in upper and lower respiratory specimens during the acute phase of infection. Negative results do not preclude SARS-CoV-2 infection, do not rule out co-infections with other pathogens, and should not be used as the sole basis for treatment or other patient management decisions. Negative results must be combined with clinical observations, patient history, and epidemiological information. The expected result is Negative. Fact Sheet for Patients: SugarRoll.be Fact Sheet for Healthcare Providers: https://www.woods-mathews.com/ This test is not yet approved or cleared by the [place] FDA and  has been authorized for detection and/or diagnosis of SARS-CoV-2 by FDA under an Emergency Use Authorization (EUA). This  EUA will remain  in effect (meaning this test can be used) for the duration of the COVID-19 declaration under Section 56 4(b)(1) of the Act, 21 U.S.C. section 360bbb-3(b)(1), unless the authorization is terminated or revoked sooner. Performed at [facility], [place] [address], [place], [place] [address]   SARS CORONAVIRUS 2 (TAT 6-24 HRS) Nasopharyngeal Nasopharyngeal Swab     Status: None   Collection Time: 07/14/19  2:24 PM   Specimen: Nasopharyngeal Swab   Result Value Ref Range Status   SARS Coronavirus 2 NEGATIVE NEGATIVE Final    Comment: (NOTE) SARS-CoV-2 target nucleic acids are NOT DETECTED. The SARS-CoV-2 RNA is generally detectable in upper and lower respiratory specimens during the acute phase of infection. Negative results do not preclude SARS-CoV-2 infection, do not rule out co-infections with other pathogens, and should not be used as the sole basis for treatment or other patient management decisions. Negative results must be combined with clinical observations, patient history, and epidemiological information. The expected result is Negative. Fact Sheet for Patients: SugarRoll.be Fact Sheet for Healthcare Providers: https://www.woods-mathews.com/ This test is not yet approved or cleared by the [place] FDA and  has been authorized for detection and/or diagnosis of SARS-CoV-2 by FDA under an Emergency Use Authorization (EUA). This EUA will remain  in effect (meaning this test can be used) for the duration of the COVID-19 declaration under Section 56 4(b)(1) of the Act, 21 U.S.C. section 360bbb-3(b)(1), unless the authorization is terminated or revoked sooner. Performed at [facility], [place] [address] [place], [place] [address]     Imaging: No results found.

## 2019-07-15 NOTE — Progress Notes (Signed)
PROGRESS NOTE  VICTOR SCHEINER [id number] DOB: 03-16-1961   PCP: System, Pcp Not In  Patient is from: Home  DOA: 07/09/2019 LOS: 6  Brief Narrative / Interim history: 59 year old female with history of bipolar/schizoaffective disorder, hypertension, hyperlipidemia, diabetes mellitus type 2, COPD, chronic hypoxic respiratory failure on 2 L oxygen via nasal cannula, chronic hyponatremia with recurrent hospitalizations for the same, chronic diastolic CHF, chronic abdominal pain presented to the hospital again with epigastric/abdominal pain along with nausea and dry heaves.  She also has a history of abdominal hernia.  She was found to be hyponatremic in the ED with a sodium of 118.  During the hospitalization, nephrology evaluated the patient and subsequently signed off with recommendations of fluid restriction.  GI and general surgery were also consulted for abdominal pain and hernia.  She underwent EGD on 07/11/2019 which showed diffuse gastritis and small hiatal hernia.  Subjective: No major events overnight of this morning.  Complains of diffuse abdominal pain.  She rates his pain as 8/10 but hardly describe the nature of the pain.  No radiation.  Denies nausea, vomiting or diarrhea.  She attributes her pain to abdominal hernia.  Denies chest pain, dyspnea or UTI symptoms.  Objective: Vitals:   07/15/19 0603 07/15/19 0830 07/15/19 0903 07/15/19 1227  BP: (!) 135/57  132/60 113/75  Pulse: (!) 50  (!) 55 60  Resp: 17   18  Temp: 97.8 F (36.6 C)   98.1 F (36.7 C)  TempSrc: Oral   Oral  SpO2: 97% 94%  99%  Weight: 93.2 kg     Height:        Intake/Output Summary (Last 24 hours) at 07/15/2019 1611 Last data filed at 07/15/2019 1400 Gross per 24 hour  Intake 2060 ml  Output 1450 ml  Net 610 ml   Filed Weights   07/13/19 0500 07/14/19 0422 07/15/19 0603  Weight: 89.1 kg 90.1 kg 93.2 kg    Examination:  GENERAL: No acute distress.  Appears well.  HEENT: MMM.  Vision and  hearing grossly intact.  NECK: Supple.  No apparent JVD.  RESP:  No IWOB. Good air movement bilaterally. CVS:  RRR. Heart sounds normal.  ABD/GI/GU: Bowel sounds present. Soft.  Mild diffuse tenderness.  Reducible abdominal hernia. MSK/EXT:  Moves extremities. No apparent deformity. No edema.  SKIN: no apparent skin lesion or wound NEURO: Awake, alert and oriented appropriately.  No apparent focal neuro deficit. PSYCH: Calm. Normal affect.  Procedures:  None  Assessment & Plan: Hyponatremia: Na 125-130 (baseline)> 118 (admit)> 129> 121Concern about psychogenic polydipsia versus SIADH per nephrology.  Urine sodium slightly high.  P.o. NaCl and demeclocycline stopped by nephrology. Associated hyperkalemia also concerning for hypoaldosteronism.  Sodium dropped to 121 again. -Continue 2L fluid restriction.   -Check aldosterone/renin activities -Start Florinef -Recheck in the morning.  Chronic diastolic CHF: Appears euvolemic.  On Lasix at home. -Continue fluid restriction as above -Monitor fluid status, renal functions and electrolytes.  Chronic abdominal pain/gastritis/GERD/abdominal hernia: Had multiple CT scans that revealed ventral hernia without incarceration.  Hernia repair visible.  EGD on 1/16 with diffuse gastritis.  -General surgery and GI signed off. -Continue PPI -Add Carafate.  Chronic COPD/chronic respiratory failure: Stable -Continue nebs and inhalers.  Essential hypertension: Normotensive -Continue metoprolol, hydralazine and nifedipine  Schizoaffective disorder/type I bipolar disorder/anxiety -Appreciate psych input-  -discontinue Celexa 20 mg daily -Discontinued Gabapentin 100 mg BID -Continue Wellbutrin 150 mg daily -Continue Latuda 20 mg BID -Continue Depakote 500 mg TID -  Continue Zyprexa 5 mg BID  -Discontinued Zyprexa 5 mg PRN  Class II obesity: BMI 34.19.  Debility/general deconditioning -PT/OT eval                 DVT prophylaxis: Subcu  Lovenox Code Status: Full code Family Communication: Patient and/or RN. Available if any question.  Disposition Plan: Back to ALF once hyponatremia improves. Consultants: GI (off).  General surgery (off).  Psychiatry (off)   Microbiology summarized: 1/14-COVID-19 negative. 1/19-COVID-19 negative.  Sch Meds:  Scheduled Meds: . aspirin  81 mg Oral Daily  . buPROPion  150 mg Oral Daily  . clonazePAM  1 mg Oral TID  . docusate sodium  100 mg Oral BID  . enoxaparin (LOVENOX) injection  40 mg Subcutaneous Q24H  . famotidine  20 mg Oral BID  . feeding supplement (ENSURE ENLIVE)  237 mL Oral Q24H  . fludrocortisone  0.1 mg Oral Daily  . fluticasone  2 spray Each Nare BID  . fluticasone furoate-vilanterol  1 puff Inhalation Daily  . guaiFENesin  1,200 mg Oral Daily  . hydrALAZINE  25 mg Oral Q8H  . hydrOXYzine  50 mg Oral TID  . ipratropium-albuterol  3 mL Nebulization TID  . loratadine  10 mg Oral Daily  . lurasidone  20 mg Oral BID  . Melatonin  6 mg Oral QHS  . metoprolol tartrate  25 mg Oral BID  . nicotine  7 mg Transdermal Daily  . NIFEdipine  90 mg Oral Daily  . OLANZapine  5 mg Oral BID  . pantoprazole  40 mg Oral BID AC  . polyethylene glycol  17 g Oral Daily  . sodium chloride flush  3 mL Intravenous Q12H  . sucralfate  1 g Oral TID WC & HS  . valproic acid  500 mg Oral TID   Continuous Infusions: PRN Meds:.acetaminophen **OR** acetaminophen, albuterol, alum & mag hydroxide-simeth, benzonatate, calcium carbonate, clonazePAM, hydrALAZINE, loperamide, neomycin-bacitracin-polymyxin, nystatin, ondansetron **OR** ondansetron (ZOFRAN) IV, oxyCODONE, sodium chloride  Antimicrobials: Anti-infectives (From admission, onward)   Start     Dose/Rate Route Frequency Ordered Stop   07/09/19 1130  demeclocycline (DECLOMYCIN) tablet 150 mg  Status:  Discontinued     150 mg Oral 2 times daily 07/09/19 1126 07/11/19 1838       I have personally reviewed the following labs and  images: CBC: Recent Labs  Lab 07/09/19 0725 07/10/19 0259 07/14/19 0734 07/15/19 0345  WBC 4.1 4.4 5.5 4.8  NEUTROABS 2.5  --   --  2.9  HGB 12.6 11.7* 12.0 12.1  HCT 37.0 34.4* 36.8 35.9*  MCV 90.2 89.8 92.0 90.4  PLT 167 191 206 210   BMP &GFR Recent Labs  Lab 07/12/19 0521 07/12/19 1227 07/13/19 0740 07/14/19 0734 07/15/19 0345  NA 128* 127* 130* 129* 121*  K 4.7 5.2* 5.3* 5.6* 5.2*  CL 94* 94* 93* 92* 84*  CO2 27 28 30 30 29   GLUCOSE 131* 103* 77 98 113*  BUN 16 14 8 18 18   CREATININE 0.58 0.59 0.61 0.84 0.87  CALCIUM 7.9* 8.4* 8.7* 8.2* 8.2*  MG  --   --   --   --  1.9   Estimated Creatinine Clearance: 80.5 mL/min (by C-G formula based on SCr of 0.87 mg/dL). Liver & Pancreas: Recent Labs  Lab 07/09/19 0725 07/14/19 0734 07/15/19 0345  AST 18 12* 11*  ALT 12 14 13   ALKPHOS 65 77 69  BILITOT 0.3 0.2* 0.5  PROT 6.7 5.4* 5.7*  ALBUMIN 2.8* 2.4* 2.6*   Recent Labs  Lab 07/09/19 0725  LIPASE 26   No results for input(s): AMMONIA in the last 168 hours. Diabetic: No results for input(s): HGBA1C in the last 72 hours. No results for input(s): GLUCAP in the last 168 hours. Cardiac Enzymes: No results for input(s): CKTOTAL, CKMB, CKMBINDEX, TROPONINI in the last 168 hours. No results for input(s): PROBNP in the last 8760 hours. Coagulation Profile: Recent Labs  Lab 07/09/19 0725  INR 0.9   Thyroid Function Tests: No results for input(s): TSH, T4TOTAL, FREET4, T3FREE, THYROIDAB in the last 72 hours. Lipid Profile: No results for input(s): CHOL, HDL, LDLCALC, TRIG, CHOLHDL, LDLDIRECT in the last 72 hours. Anemia Panel: No results for input(s): VITAMINB12, FOLATE, FERRITIN, TIBC, IRON, RETICCTPCT in the last 72 hours. Urine analysis:    Component Value Date/Time   COLORURINE YELLOW 07/02/2019 1048   APPEARANCEUR CLEAR 07/02/2019 1048   APPEARANCEUR Clear 06/22/2012 0618   LABSPEC 1.012 07/02/2019 1048   LABSPEC 1.003 06/22/2012 0618   PHURINE 6.0  07/02/2019 1048   GLUCOSEU NEGATIVE 07/02/2019 1048   GLUCOSEU Negative 06/22/2012 0618   GLUCOSEU NEGATIVE 04/09/2011 1058   HGBUR NEGATIVE 07/02/2019 1048   BILIRUBINUR NEGATIVE 07/02/2019 1048   BILIRUBINUR Negative 06/22/2012 0618   KETONESUR NEGATIVE 07/02/2019 1048   PROTEINUR NEGATIVE 07/02/2019 1048   UROBILINOGEN 1.0 08/18/2011 1805   NITRITE NEGATIVE 07/02/2019 1048   LEUKOCYTESUR NEGATIVE 07/02/2019 1048   LEUKOCYTESUR Trace 06/22/2012 0618   Sepsis Labs: Invalid input(s): PROCALCITONIN, [place]  Microbiology: Recent Results (from the past 240 hour(s))  SARS CORONAVIRUS 2 (TAT 6-24 HRS) Nasopharyngeal Nasopharyngeal Swab     Status: None   Collection Time: 07/09/19 10:27 AM   Specimen: Nasopharyngeal Swab  Result Value Ref Range Status   SARS Coronavirus 2 NEGATIVE NEGATIVE Final    Comment: (NOTE) SARS-CoV-2 target nucleic acids are NOT DETECTED. The SARS-CoV-2 RNA is generally detectable in upper and lower respiratory specimens during the acute phase of infection. Negative results do not preclude SARS-CoV-2 infection, do not rule out co-infections with other pathogens, and should not be used as the sole basis for treatment or other patient management decisions. Negative results must be combined with clinical observations, patient history, and epidemiological information. The expected result is Negative. Fact Sheet for Patients: SugarRoll.be Fact Sheet for Healthcare Providers: https://www.woods-mathews.com/ This test is not yet approved or cleared by the [place] FDA and  has been authorized for detection and/or diagnosis of SARS-CoV-2 by FDA under an Emergency Use Authorization (EUA). This EUA will remain  in effect (meaning this test can be used) for the duration of the COVID-19 declaration under Section 56 4(b)(1) of the Act, 21 U.S.C. section 360bbb-3(b)(1), unless the authorization is terminated or revoked  sooner. Performed at [facility], [place] [address], [place], [place] [address]   SARS CORONAVIRUS 2 (TAT 6-24 HRS) Nasopharyngeal Nasopharyngeal Swab     Status: None   Collection Time: 07/14/19  2:24 PM   Specimen: Nasopharyngeal Swab  Result Value Ref Range Status   SARS Coronavirus 2 NEGATIVE NEGATIVE Final    Comment: (NOTE) SARS-CoV-2 target nucleic acids are NOT DETECTED. The SARS-CoV-2 RNA is generally detectable in upper and lower respiratory specimens during the acute phase of infection. Negative results do not preclude SARS-CoV-2 infection, do not rule out co-infections with other pathogens, and should not be used as the sole basis for treatment or other patient management decisions. Negative results must be combined with clinical  observations, patient history, and epidemiological information. The expected result is Negative. Fact Sheet for Patients: SugarRoll.be Fact Sheet for Healthcare Providers: https://www.woods-mathews.com/ This test is not yet approved or cleared by the [place] FDA and  has been authorized for detection and/or diagnosis of SARS-CoV-2 by FDA under an Emergency Use Authorization (EUA). This EUA will remain  in effect (meaning this test can be used) for the duration of the COVID-19 declaration under Section 56 4(b)(1) of the Act, 21 U.S.C. section 360bbb-3(b)(1), unless the authorization is terminated or revoked sooner. Performed at [facility], [place] [address], [place], [place] [address]     Radiology Studies: No results found.  35 minutes with more than 50% spent in reviewing records, counseling patient/family and coordinating care.   Kerly Rigsbee T. Falkland  If 7PM-7AM, please contact night-coverage [email or web address] Password [email or web address] 07/15/2019, 4:11 PM

## 2019-07-16 LAB — BASIC METABOLIC PANEL
Anion gap: 6 (ref 5–15)
BUN: 20 mg/dL (ref 6–20)
CO2: 31 mmol/L (ref 22–32)
Calcium: 8.2 mg/dL — ABNORMAL LOW (ref 8.9–10.3)
Chloride: 86 mmol/L — ABNORMAL LOW (ref 98–111)
Creatinine, Ser: 0.73 mg/dL (ref 0.44–1.00)
GFR calc Af Amer: >60 mL/min (ref >60)
GFR calc non Af Amer: >60 mL/min (ref >60)
Glucose, Bld: 99 mg/dL (ref 70–99)
Potassium: 5 mmol/L (ref 3.5–5.1)
Sodium: 123 mmol/L — ABNORMAL LOW (ref 135–145)

## 2019-07-16 LAB — CBC
HCT: 34.3 % — ABNORMAL LOW (ref 36.0–46.0)
Hemoglobin: 11.4 g/dL — ABNORMAL LOW (ref 12.0–15.0)
MCH: 30.1 pg (ref 26.0–34.0)
MCHC: 33.2 g/dL (ref 30.0–36.0)
MCV: 90.5 fL (ref 80.0–100.0)
Platelets: 217 10*3/uL (ref 150–400)
RBC: 3.79 MIL/uL — ABNORMAL LOW (ref 3.87–5.11)
RDW: 12.8 % (ref 11.5–15.5)
WBC: 5.2 10*3/uL (ref 4.0–10.5)
nRBC: 0 % (ref 0.0–0.2)

## 2019-07-16 LAB — MAGNESIUM: Magnesium: 1.8 mg/dL (ref 1.7–2.4)

## 2019-07-16 LAB — SARS CORONAVIRUS 2 (TAT 6-24 HRS): SARS Coronavirus 2: NEGATIVE

## 2019-07-16 MED ORDER — IPRATROPIUM-ALBUTEROL 0.5-2.5 (3) MG/3ML IN SOLN
3.0000 mL | Freq: Two times a day (BID) | RESPIRATORY_TRACT | Status: DC
  Administered 2019-07-16 – 2019-07-17 (×2): 3 mL via RESPIRATORY_TRACT
  Filled 2019-07-16 (×2): qty 3

## 2019-07-16 MED ORDER — PANTOPRAZOLE SODIUM 40 MG PO TBEC: 40.0000 mg | DELAYED_RELEASE_TABLET | Freq: Every day | ORAL | 1 refills | Status: DC

## 2019-07-16 MED ORDER — ACETAMINOPHEN 325 MG PO TABS: 650.0000 mg | ORAL_TABLET | Freq: Four times a day (QID) | ORAL | Status: DC | PRN

## 2019-07-16 MED ORDER — HYDROXYZINE PAMOATE 25 MG PO CAPS: 25.0000 mg | ORAL_CAPSULE | Freq: Four times a day (QID) | ORAL | 1 refills | Status: DC | PRN

## 2019-07-16 MED ORDER — IPRATROPIUM-ALBUTEROL 0.5-2.5 (3) MG/3ML IN SOLN
3.0000 mL | Freq: Three times a day (TID) | RESPIRATORY_TRACT | Status: DC
  Administered 2019-07-16: 3 mL via RESPIRATORY_TRACT

## 2019-07-16 MED ORDER — CLONAZEPAM 1 MG PO TABS: 1.0000 mg | ORAL_TABLET | Freq: Two times a day (BID) | ORAL | 0 refills | Status: DC

## 2019-07-16 MED ORDER — FLUDROCORTISONE ACETATE 0.1 MG PO TABS: 0.1000 mg | ORAL_TABLET | Freq: Two times a day (BID) | ORAL | 1 refills | Status: DC

## 2019-07-16 MED ORDER — SUCRALFATE 1 GM/10ML PO SUSP: 1.0000 g | Freq: Three times a day (TID) | ORAL | 1 refills | Status: AC

## 2019-07-16 NOTE — TOC Progression Note (Addendum)
Transition of Care [facility]) - Progression Note    Patient Details  Name: Natalie Thomas MRN: [id number] Date of Birth: 06-May-1961  Transition of Care [facility]) CM/SW [place], [place] Phone Number: 07/16/2019, 10:57 AM  Clinical Narrative:    4:00pm- CSW also received a call from Development worker, international aid Natalie Thomas who states that the RN has said that they cannot meet her "fluid restriction and oxygen needs." Pt only on 2L nasal canula however ED states that the RN said they cannot manage that. CSW requested that ED call and discuss this with pt guardian.   3:53pm- CSW received call from [place] at [facility]. CSW was informed that per Development worker, international aid Natalie Thomas that they cannot meet pt needs due to her 2071mL fluid restriction. CSW has alerted [facility] supervisor Natalie Thomas and pt guardian Natalie Thomas of this update. Natalie Thomas will contact her department to see if this is something they can work on with ALF, in the mean time CSW to send referral for SNF at guardian request.   1:41pm- CSW spoke with contact Natalie Thomas at ALF, confirmed fax number and they state they will look for fax on their machine and review it as they "havent received it" but then stated they would "check their machine." CSW also sent secure email to Executive Director Natalie Thomas letting her know pt is stable for return.   12:34pm- CSW spoke with pt, pt agreeable to return to [facility], aware we are waiting for approval from ALF. CSW called Natalie Mccoy, NP with ALF [phone]) to review faxed documents.  10:57am- CSW spoke with Natalie Thomas, pt guardian. We discussed pt return to ALF today, she is amenable, understands that we need to get approval from ALF staff with dc summary and fl2 prior to pt return.   CSW will send information requested to [phone]. Will also f/u with Natalie Thomas at ALF.    Expected Discharge Plan: Memory Care Barriers to Discharge: Continued Medical Work up  Expected Discharge Plan and  Services Expected Discharge Plan: Memory Care In-house Referral: Clinical Social Work Discharge Planning Services: CM Consult Post Acute Care Choice: Resumption of Svcs/PTA Provider Living arrangements for the past 2 months: Assisted Living Facility(Memory Care)  Readmission Risk Interventions Readmission Risk Prevention Plan 07/10/2019 07/01/2019 03/10/2019  Transportation Screening Complete Complete Complete  Medication Review (RN Care Manager) Referral to Pharmacy Referral to Pharmacy Complete  PCP Thomas Specialist appointment within 3-5 days of discharge Complete Complete Complete  HRI Thomas [place] Not Complete Not Complete Complete  [place] [facility] Consult Pt Refusal Comments pt is resident of [facility] No orders yet, patient is from [place] Care/Counseling Consult Complete Complete Complete  Palliative Care Screening Not Applicable Not Applicable Not [place] Not Applicable Not Applicable Complete  Some recent data might be hidden

## 2019-07-16 NOTE — Evaluation (Signed)
Physical Therapy Evaluation Patient Details Name: Natalie Thomas MRN: [id number] DOB: 1961/03/21 Today's Date: 07/16/2019   History of Present Illness  Pt is a 59 y/o female admitted from ALF secndary to abdominal pain. Pt is s/p EGD which showed diffuse gastritis and small hiatal hernia. PMH includes COPD on 2L of O2, schizoaffective disorder, HTN, DM, and dCHF.   Clinical Impression  Pt admitted secondary to problem above with deficits below. Pt fatigued quickly and only able to tolerate short distance ambulation. Required min guard A for mobility using RW. Pt with increased shortness of breath, however, oxygen sats at 91-94% on 2L. Feel pt would benefit from SNF level therapies at d/c. Will continue to follow acutely to maximize functional mobility independence and safety.     Follow Up Recommendations SNF    Equipment Recommendations  None recommended by PT    Recommendations for Other Services       Precautions / Restrictions Precautions Precautions: Fall Restrictions Weight Bearing Restrictions: No      Mobility  Bed Mobility Overal bed mobility: Needs Assistance Bed Mobility: Supine to Sit;Sit to Supine     Supine to sit: Min assist Sit to supine: Min guard   General bed mobility comments: Min A for trunk elevation. Increased time to perform.   Transfers Overall transfer level: Needs assistance Equipment used: Rolling walker (2 wheeled) Transfers: Sit to/from Stand Sit to Stand: Min guard         General transfer comment: Min guard for for safety. Cues for safe hand placement.   Ambulation/Gait Ambulation/Gait assistance: Min guard Gait Distance (Feet): 6 Feet Assistive device: Rolling walker (2 wheeled) Gait Pattern/deviations: Step-through pattern;Decreased stride length Gait velocity: Decreased   General Gait Details: Slow guarded gait. Pt fatiguing quickly. Some SOB noted, however, oxygen sats at 91-94% on 2L.   Stairs             Wheelchair Mobility    Modified Rankin (Stroke Patients Only)       Balance Overall balance assessment: Needs assistance Sitting-balance support: No upper extremity supported;Feet supported Sitting balance-Leahy Scale: Fair     Standing balance support: Bilateral upper extremity supported;During functional activity Standing balance-Leahy Scale: Poor Standing balance comment: Reliant on BUE suppory                             Pertinent Vitals/Pain Pain Assessment: Faces Faces Pain Scale: Hurts little more Pain Location: abdominal pain Pain Descriptors / Indicators: Grimacing Pain Intervention(s): Limited activity within patient's tolerance;Monitored during session;Repositioned    Home Living Family/patient expects to be discharged to:: Skilled nursing facility                 Additional Comments: Pt from ALF, however, plans to go to SNF at d/c     Prior Function Level of Independence: Needs assistance   Gait / Transfers Assistance Needed: Pt reports using WC mainly and doing some walking with RW and assist.   ADL's / Homemaking Assistance Needed: Pt reports needing assist with ADLs.         Hand Dominance        Extremity/Trunk Assessment   Upper Extremity Assessment Upper Extremity Assessment: Defer to OT evaluation    Lower Extremity Assessment Lower Extremity Assessment: Generalized weakness    Cervical / Trunk Assessment Cervical / Trunk Assessment: Kyphotic  Communication   Communication: No difficulties  Cognition Arousal/Alertness: Awake/alert Behavior During Therapy: WFL for tasks  assessed/performed Overall Cognitive Status: No family/caregiver present to determine baseline cognitive functioning                                        General Comments      Exercises     Assessment/Plan    PT Assessment Patient needs continued PT services  PT Problem List Decreased mobility;Decreased activity  tolerance;Decreased balance;Pain;Decreased strength       PT Treatment Interventions DME instruction;Therapeutic exercise;Functional mobility training;Therapeutic activities;Patient/family education;Gait training;Balance training    PT Goals (Current goals can be found in the Care Plan section)  Acute Rehab PT Goals Patient Stated Goal: to go for a walk  PT Goal Formulation: With patient Time For Goal Achievement: 07/30/19 Potential to Achieve Goals: Good    Frequency Min 2X/week   Barriers to discharge        Co-evaluation               AM-PAC PT "6 Clicks" Mobility  Outcome Measure Help needed turning from your back to your side while in a flat bed without using bedrails?: A Little Help needed moving from lying on your back to sitting on the side of a flat bed without using bedrails?: A Little Help needed moving to and from a bed to a chair (including a wheelchair)?: A Little Help needed standing up from a chair using your arms (e.g., wheelchair or bedside chair)?: A Little Help needed to walk in hospital room?: A Little Help needed climbing 3-5 steps with a railing? : A Lot 6 Click Score: 17    End of Session Equipment Utilized During Treatment: Gait belt;Oxygen Activity Tolerance: Patient limited by fatigue Patient left: in bed;with call bell/phone within reach;with bed alarm set Nurse Communication: Mobility status PT Visit Diagnosis: Difficulty in walking, not elsewhere classified (R26.2);Muscle weakness (generalized) (M62.81)    Time: [id number] PT Time Calculation (min) (ACUTE ONLY): 21 min   Charges:   PT Evaluation $PT Eval Moderate Complexity: 1 Mod          Reuel Derby, PT, DPT  Acute Rehabilitation Services  Pager: [phone] Office: [phone]   Rudean Hitt 07/16/2019, 5:47 PM

## 2019-07-16 NOTE — Discharge Summary (Signed)
Physician Discharge Summary  Natalie Thomas [id number] DOB: 1960/12/04 DOA: 07/09/2019  PCP: System, Pcp Not In  Admit date: 07/09/2019 Discharge date: 07/16/2019  Admitted From: ALF Disposition: ALF  Recommendations for Outpatient Follow-up:  1. Follow ups as below.  2. Recommend referral to psychiatry outpatient. 3. Please obtain CBC/BMP/Mag in 1 week. 4. Please follow up on the following pending results: Aldosterone/renin activity  Home Health: None Equipment/Devices: None  Discharge Condition: Stable CODE STATUS: Full code  Follow-up Information    Sur[facility], [place]. Call.   Specialty: General Surgery Why: Call and schedule an appointment to discuss elective hernia repair if you would like to consider this.  Contact information: [address] [place] [address] [phone]           Hospital Course: 59 year old female with history of bipolar/schizoaffective disorder, hypertension, hyperlipidemia, diabetes mellitus type 2, COPD, chronic hypoxic respiratory failure on 2 L oxygen via nasal cannula, chronic hyponatraemia with recurrent hospitalizations for the same, chronic diastolic CHF, chronic abdominal pain presented to the hospital again with epigastric/abdominal pain along with nausea and dry heaves. She also has a history of abdominal hernia. She was found to be hyponatremic in the ED with a sodium of 118.  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  Nephrology recommended fluid restriction to 2 L a day and then discontinuing demeclocycline and oral sodium chloride.  Sodium initially improved to 129 before dropping back to 121.  She also had a mild hyperkalemia.  She was started on Florinefwith improvement in her hyponatremia and hyperkalemia.  She is discharged on Florinef.  In regards to abdominal pain, CT abdomen and pelvis with ventral hernia without incarceration. GI and general surgery were were  consulted. She underwent EGD on 07/11/2019 which showed diffuse gastritis and small hiatal hernia.  General surgery had nothing much to offer, and signed off.  She was started on PPI and Carafate and discharged home.  Encouraged to avoid NSAIDs and aspirin.  See individual problem list below for more on hospital course.  Discharge Diagnoses:  Hyponatremia: Na 125-130 (baseline)> 118 (admit)> 129> 121>123.  Concern about psychogenic polydipsia versus SIADH per nephrology.  Urine sodium slightly high.  P.o. NaCl and demeclocycline stopped by nephrology. Associated hyperkalemia also concerning for hypoaldosteronism.  Discontinued Celexa per psych recommendation.  Also discontinued lisinopril which could contribute to hyponatremia. -Continue 2L fluid restriction.   -Follow aldosterone/renin activities-pending at time of discharge. -Continue Florinef 0.1 mg twice daily. -Recheck BMP in 1 week.  Chronic diastolic CHF: Appears euvolemic.  On Lasix at home. -Continue home Lasix. -Fluid restriction as above. -Liberate sodium intake given hyponatremia  Chronic abdominal pain/gastritis/GERD/abdominal hernia: Had multiple CT scans that revealed ventral hernia without incarceration. EGD on 1/16 with diffuse gastritis.  -General surgery and GI signed off. -Continue PPI and Carafate. -Avoid NSAIDs.  Chronic COPD/chronic respiratory failure: On 2 L by nasal cannula at baseline.  Stable -Continue nebs and inhalers.  Essential hypertension: Normotensive -Continue home medications.  Discontinued lisinopril in the setting of hyponatremia  Schizoaffective disorder/type I bipolar disorder/anxiety -Appreciate psych input-             -Discontinue Celexa 20 mg daily -Discontinued Gabapentin 100 mg BID -Continue Wellbutrin 150 mg daily -Continue Latuda 20 mg BID -Continue Depakote 500 mg TID -Continue Zyprexa 5 mg BID  -Discontinued Zyprexa 5 mg PRN  Class II obesity: BMI 34.19.    Discharge  Instructions  Discharge Instructions  Diet general   Complete by: As directed    With fluid restriction to 2 L a day.   Increase activity slowly   Complete by: As directed      Allergies as of 07/16/2019      Reactions   Clarithromycin Itching   Penicillins Itching   Has tolerated cefazolin before Has patient had a PCN reaction causing immediate rash, facial/tongue/throat swelling, SOB or lightheadedness with hypotension: No Has patient had a PCN reaction causing severe rash involving mucus membranes or skin necrosis: No Has patient had a PCN reaction that required hospitalization: Unknown Has patient had a PCN reaction occurring within the last 10 years: No If all of the above answers are "NO", then may proceed with Cephalosporin use.      Medication List    STOP taking these medications   aspirin 81 MG chewable tablet   benzonatate 100 MG capsule Commonly known as: TESSALON   Calcium Carbonate 500 MG Chew   citalopram 20 MG tablet Commonly known as: CELEXA   demeclocycline 150 MG tablet Commonly known as: DECLOMYCIN   feeding supplement (PRO-STAT SUGAR FREE 64) Liqd   gabapentin 100 MG capsule Commonly known as: NEURONTIN   lisinopril 40 MG tablet Commonly known as: ZESTRIL   methocarbamol 500 MG tablet Commonly known as: ROBAXIN   oxyCODONE 5 MG immediate release tablet Commonly known as: Oxy IR/ROXICODONE   sodium chloride 1 g tablet   Zinc Gluconate 100 MG Tabs     TAKE these medications   acetaminophen 325 MG tablet Commonly known as: TYLENOL Take 325 mg by mouth every 6 (six) hours as needed for fever. Not to exceed 2000MG    acetaminophen 500 MG tablet Commonly known as: TYLENOL Take 1,000 mg by mouth every 8 (eight) hours as needed for mild pain or moderate pain. Not to exceed 3g/24h of tylenol from all sources.   albuterol (2.5 MG/3ML) 0.083% nebulizer solution Commonly known as: PROVENTIL Take 3 mLs (2.5 mg total) by nebulization every 4  (four) hours as needed for wheezing or shortness of breath.   ANTACID LIQUID PO Take 30 mLs by mouth every 6 (six) hours as needed (heartburn).   Breo Ellipta 100-25 MCG/INH Aepb Generic drug: fluticasone furoate-vilanterol Inhale 1 puff into the lungs daily.   buPROPion 150 MG 24 hr tablet Commonly known as: WELLBUTRIN XL Take 150 mg by mouth daily.   Calcium 600+D 600-800 MG-UNIT Tabs Generic drug: Calcium Carb-Cholecalciferol Take 1 tablet by mouth daily.   clonazePAM 1 MG tablet Commonly known as: KLONOPIN Take 1 tablet (1 mg total) by mouth 2 (two) times daily. What changed:   when to take this  Another medication with the same name was removed. Continue taking this medication, and follow the directions you see here.   docusate sodium 100 MG capsule Commonly known as: COLACE Take 100 mg by mouth See admin instructions. Take one capsule (100 mg) by mouth every 12 hours, may also take one capsule (100 mg) twice daily as needed for constipation   feeding supplement (ENSURE ENLIVE) Liqd Take 237 mLs by mouth daily.   fludrocortisone 0.1 MG tablet Commonly known as: FLORINEF Take 1 tablet (0.1 mg total) by mouth 2 (two) times daily.   fluticasone 50 MCG/ACT nasal spray Commonly known as: FLONASE Place 2 sprays into both nostrils 2 (two) times daily.   furosemide 20 MG tablet Commonly known as: LASIX Take 20 mg by mouth daily.   guaiFENesin 600 MG 12 hr tablet Commonly  known as: MUCINEX Take 1,200 mg by mouth daily. What changed: Another medication with the same name was removed. Continue taking this medication, and follow the directions you see here.   hydrOXYzine 25 MG capsule Commonly known as: VISTARIL Take 1 capsule (25 mg total) by mouth every 6 (six) hours as needed for anxiety or itching. What changed:   medication strength  how much to take  when to take this  reasons to take this   ipratropium-albuterol 0.5-2.5 (3) MG/3ML Soln Commonly known  as: DUONEB Take 3 mLs by nebulization 3 (three) times daily.   loperamide 2 MG tablet Commonly known as: IMODIUM A-D Take 2 mg by mouth every 6 (six) hours as needed for diarrhea or loose stools.   loratadine 10 MG tablet Commonly known as: CLARITIN Take 10 mg by mouth daily.   lurasidone 20 MG Tabs tablet Commonly known as: LATUDA Take 1 tablet (20 mg total) by mouth 2 (two) times daily.   Melatonin 5 MG Tabs Take 5 mg by mouth at bedtime.   metoprolol tartrate 25 MG tablet Commonly known as: LOPRESSOR Take 1 tablet (25 mg total) by mouth 2 (two) times daily.   multivitamin with minerals Tabs tablet Take 1 tablet by mouth daily.   neomycin-bacitracin-polymyxin ointment Commonly known as: NEOSPORIN Apply 1 application topically as needed for wound care.   nicotine 7 mg/24hr patch Commonly known as: NICODERM CQ - dosed in mg/24 hr Place 7 mg onto the skin daily.   NIFEdipine 90 MG 24 hr tablet Commonly known as: PROCARDIA XL/NIFEDICAL-XL Take 90 mg by mouth daily.   nystatin powder Generic drug: nystatin Apply 1 application topically 2 (two) times daily as needed (irritation). Apply topically to reddened area twice daily as needed for irritation   OLANZapine 5 MG tablet Commonly known as: ZYPREXA Take 1 tablet (5 mg total) by mouth 2 (two) times daily. What changed:   when to take this  reasons to take this  Another medication with the same name was removed. Continue taking this medication, and follow the directions you see here.   omeprazole 40 MG capsule Commonly known as: PRILOSEC Take 1 capsule (40 mg total) by mouth at bedtime.   ondansetron 8 MG tablet Commonly known as: ZOFRAN Take 8 mg by mouth every 6 (six) hours as needed for nausea or vomiting.   pantoprazole 40 MG tablet Commonly known as: Protonix Take 1 tablet (40 mg total) by mouth daily.   polyethylene glycol 17 g packet Commonly known as: MiraLax Take 17 g by mouth daily. What  changed: additional instructions   sodium chloride 0.65 % Soln nasal spray Commonly known as: OCEAN Place 2 sprays into both nostrils every 2 (two) hours as needed (dryness, irritation).   sucralfate 1 GM/10ML suspension Commonly known as: CARAFATE Take 10 mLs (1 g total) by mouth 4 (four) times daily -  with meals and at bedtime.   valproic acid 250 MG capsule Commonly known as: DEPAKENE Take 2 capsules (500 mg total) by mouth 2 (two) times daily. What changed: when to take this       Consultations:  Nephrology  GI  General surgery  Procedures/Studies:   CT ABDOMEN PELVIS W CONTRAST  Result Date: 07/01/2019 CLINICAL DATA:  Abdominal pain and distension EXAM: CT ABDOMEN AND PELVIS WITH CONTRAST TECHNIQUE: Multidetector CT imaging of the abdomen and pelvis was performed using the standard protocol following bolus administration of intravenous contrast. CONTRAST:  158mL OMNIPAQUE IOHEXOL 300 MG/ML  SOLN  COMPARISON:  CT 04/21/2019 FINDINGS: Lower chest: Bilateral small pleural effusions Hepatobiliary: No focal hepatic lesion. Postcholecystectomy. No biliary dilatation. Pancreas: Pancreas is normal. No ductal dilatation. No pancreatic inflammation. Spleen: Normal spleen Adrenals/urinary tract: Nonobstructing calculus in the mid LEFT kidney. Cortical calcification in the RIGHT kidney. Ureters are normal. There is thickening along the anterior RIGHT wall the bladder which is mild measuring 6 mm on image 65/2. Stomach/Bowel: Stomach, small-bowel and cecum are normal. The appendix is not identified but there is no pericecal inflammation to suggest appendicitis. The colon and rectosigmoid colon are normal. Vascular/Lymphatic: Abdominal aorta is normal caliber with atherosclerotic calcification. There is no retroperitoneal or periportal lymphadenopathy. No pelvic lymphadenopathy. Reproductive: Uterus and necks are normal. Other: No free fluid. Musculoskeletal: Degenerative changes of the spine.  Chronic compression fracture T12. IMPRESSION: 1. Small bilateral pleural effusions. 2. Mild thickening along the anterior RIGHT wall of the bladder. Recommend correlation with hematuria or signs of cystitis. 3. Aortic Atherosclerosis (ICD10-I70.0). Electronically Signed   By: Suzy Bouchard M.D.   On: 07/01/2019 20:41   DG Chest Port 1 View  Result Date: 07/09/2019 CLINICAL DATA:  Nausea, hernia history EXAM: PORTABLE CHEST 1 VIEW COMPARISON:  03/13/2019 and CT abdomen and pelvis of 07/01/2019 FINDINGS: Cardiomediastinal contours remain enlarged, accentuated by portable technique. The left hemidiaphragm is obscured likely related to mixture of airspace disease and pleural effusion. Mild increase in interstitial markings bilaterally. Signs of aortic atherosclerosis. Visualized skeletal structures are unremarkable. IMPRESSION: 1. Stable enlargement of the cardiomediastinal contours with presumed left effusion and basilar airspace disease. 2. Mild increase in interstitial markings may represent mild pulmonary edema. Electronically Signed   By: Zetta Bills M.D.   On: 07/09/2019 08:20   DG Abdomen Acute W/Chest  Result Date: 06/30/2019 CLINICAL DATA:  Abdominal pain EXAM: DG ABDOMEN ACUTE W/ 1V CHEST COMPARISON:  None. FINDINGS: There is no evidence of dilated bowel loops or free intraperitoneal air. Moderate stool burden. Cholecystectomy clips. Mild chronic interstitial prominence.  Stable cardiomegaly. Bilateral sacral plasty.  Partially imaged right femur fixation. IMPRESSION: No acute process. Electronically Signed   By: Macy Mis M.D.   On: 06/30/2019 17:19   DG Abd Portable 1 View  Result Date: 07/09/2019 CLINICAL DATA:  Nausea, history of hernia EXAM: PORTABLE ABDOMEN - 1 VIEW COMPARISON:  CT of 07/01/2019 FINDINGS: Scattered gas-filled loops of bowel throughout the abdomen. Imaging obtained in supine position. No signs of free air or definitive signs of obstruction on plain film radiograph.  Signs of bilateral sacral plasty. Post op changes of cholecystectomy and prior femoral ORIF partially imaged. No acute bone process. Signs of vascular calcification. IMPRESSION: Scattered gas-filled loops of bowel throughout the abdomen. No signs of free air or definitive signs of obstruction on supine radiography. Electronically Signed   By: Zetta Bills M.D.   On: 07/09/2019 08:22   ECHOCARDIOGRAM COMPLETE  Result Date: 07/09/2019   ECHOCARDIOGRAM REPORT   Patient Name:   Natalie Thomas Date of Exam: 07/09/2019 Medical Rec #:  [id number]          Height:       65.0 in Accession #:    [id number]         Weight:       191.8 lb Date of Birth:  1961/03/12           BSA:          1.94 m Patient Age:    73 years  BP:           185/54 mmHg Patient Gender: F                  HR:           87 bpm. Exam Location:  Inpatient Procedure: 2D Echo Indications:    CHF-Acute Diastolic [id number] / [id number]  History:        Patient has prior history of Echocardiogram examinations, most                 recent 12/04/2018. COPD; Risk Factors:Dyslipidemia and Former                 Smoker. GERD>.  Sonographer:    Darlina Sicilian RDCS Referring Phys: [id number] Kimberly  1. Left ventricular ejection fraction, by visual estimation, is 60 to 65%. The left ventricle has normal function. Left ventricular septal wall thickness was moderately increased. Mildly increased left ventricular posterior wall thickness. There is moderately increased left ventricular hypertrophy.  2. Elevated left ventricular end-diastolic pressure.  3. Left ventricular diastolic parameters are consistent with Grade I diastolic dysfunction (impaired relaxation).  4. The left ventricle demonstrates global hypokinesis.  5. Global right ventricle has normal systolic function.The right ventricular size is normal. No increase in right ventricular wall thickness.  6. Left atrial size was severely dilated.  7. Right atrial size was normal.  8. The  mitral valve is normal in structure. Trivial mitral valve regurgitation. No evidence of mitral stenosis.  9. The tricuspid valve is normal in structure. 10. The aortic valve is normal in structure. Aortic valve regurgitation is not visualized. No evidence of aortic valve sclerosis or stenosis. 11. The pulmonic valve was normal in structure. Pulmonic valve regurgitation is not visualized. 12. TR signal is inadequate for assessing pulmonary artery systolic pressure. 13. The inferior vena cava is dilated in size with <50% respiratory variability, suggesting right atrial pressure of 15 mmHg. FINDINGS  Left Ventricle: Left ventricular ejection fraction, by visual estimation, is 60 to 65%. The left ventricle has normal function. The left ventricle demonstrates global hypokinesis. Mildly increased left ventricular posterior wall thickness. There is moderately increased left ventricular hypertrophy. Left ventricular diastolic parameters are consistent with Grade I diastolic dysfunction (impaired relaxation). Elevated left ventricular end-diastolic pressure. Right Ventricle: The right ventricular size is normal. No increase in right ventricular wall thickness. Global RV systolic function is has normal systolic function. Left Atrium: Left atrial size was severely dilated. Right Atrium: Right atrial size was normal in size Pericardium: There is no evidence of pericardial effusion. Mitral Valve: The mitral valve is normal in structure. Trivial mitral valve regurgitation. No evidence of mitral valve stenosis by observation. Tricuspid Valve: The tricuspid valve is normal in structure. Tricuspid valve regurgitation is trivial. Aortic Valve: The aortic valve is normal in structure. Aortic valve regurgitation is not visualized. The aortic valve is structurally normal, with no evidence of sclerosis or stenosis. Pulmonic Valve: The pulmonic valve was normal in structure. Pulmonic valve regurgitation is not visualized. Pulmonic  regurgitation is not visualized. Aorta: The aortic root, ascending aorta and aortic arch are all structurally normal, with no evidence of dilitation or obstruction. Venous: The inferior vena cava is dilated in size with less than 50% respiratory variability, suggesting right atrial pressure of 15 mmHg. IAS/Shunts: No atrial level shunt detected by color flow Doppler. There is no evidence of a patent foramen ovale. No ventricular septal defect is seen or detected. There is  no evidence of an atrial septal defect.  LEFT VENTRICLE PLAX 2D LVIDd:         4.80 cm  Diastology LVIDs:         2.70 cm  LV e' lateral:   5.11 cm/s LV PW:         0.85 cm  LV E/e' lateral: 17.6 LV IVS:        1.55 cm  LV e' medial:    5.22 cm/s LVOT diam:     2.10 cm  LV E/e' medial:  17.2 LV SV:         81 ml LV SV Index:   39.69 LVOT Area:     3.46 cm  RIGHT VENTRICLE TAPSE (M-mode): 1.6 cm LEFT ATRIUM             Index LA diam:        4.10 cm 2.11 cm/m LA Vol (A2C):   79.9 ml 41.12 ml/m LA Vol (A4C):   80.4 ml 41.37 ml/m LA Biplane Vol: 80.2 ml 41.27 ml/m  AORTIC VALVE LVOT Vmax:   104.00 cm/s LVOT Vmean:  60.500 cm/s LVOT VTI:    0.240 m  AORTA Ao Root diam: 3.15 cm MITRAL VALVE MV Area (PHT): 3.27 cm              SHUNTS MV PHT:        67.28 msec            Systemic VTI:  0.24 m MV Decel Time: 232 msec              Systemic Diam: 2.10 cm MV E velocity: 90.00 cm/s  103 cm/s MV A velocity: 103.00 cm/s 70.3 cm/s MV E/A ratio:  0.87        1.5  Skeet Latch MD Electronically signed by Skeet Latch MD Signature Date/Time: 07/09/2019/7:13:04 PM    Final    VAS [place] LOWER EXTREMITY VENOUS (DVT) (ONLY [facility] & [facility])  Result Date: 07/09/2019  Lower Venous Study Indications: Pain, and Edema.  Comparison Study: Last LEV test done on 11/09/17, negative. Performing Technologist: Oda Cogan RDMS, RVT  Examination Guidelines: A complete evaluation includes B-mode imaging, spectral Doppler, color Doppler, and power Doppler as needed of all  accessible portions of each vessel. Bilateral testing is considered an integral part of a complete examination. Limited examinations for reoccurring indications may be performed as noted.  +-----+---------------+---------+-----------+----------+--------------+ RIGHTCompressibilityPhasicitySpontaneityPropertiesThrombus Aging +-----+---------------+---------+-----------+----------+--------------+ CFV  Full           Yes      Yes                                 +-----+---------------+---------+-----------+----------+--------------+ SFJ  Full                                                        +-----+---------------+---------+-----------+----------+--------------+   +---------+---------------+---------+-----------+----------+--------------+ LEFT     CompressibilityPhasicitySpontaneityPropertiesThrombus Aging +---------+---------------+---------+-----------+----------+--------------+ CFV      Full           Yes                                          +---------+---------------+---------+-----------+----------+--------------+  SFJ      Full                                                        +---------+---------------+---------+-----------+----------+--------------+ FV Prox  Full                                                        +---------+---------------+---------+-----------+----------+--------------+ FV Mid   Full                                                        +---------+---------------+---------+-----------+----------+--------------+ FV DistalFull                                                        +---------+---------------+---------+-----------+----------+--------------+ PFV      Full                                                        +---------+---------------+---------+-----------+----------+--------------+ POP      Full           Yes      Yes                                  +---------+---------------+---------+-----------+----------+--------------+ PTV      Full                                                        +---------+---------------+---------+-----------+----------+--------------+ PERO     Full                                                        +---------+---------------+---------+-----------+----------+--------------+     Summary: Right: No evidence of common femoral vein obstruction. Left: There is no evidence of deep vein thrombosis in the lower extremity. No cystic structure found in the popliteal fossa.  *See table(s) above for measurements and observations. Electronically signed by Servando Snare MD on 07/09/2019 at 3:32:45 PM.    Final       Discharge Exam: Vitals:   07/16/19 0648 07/16/19 0743  BP: 127/65   Pulse: 62 65  Resp:  16  Temp:    SpO2: 97% 96%    GENERAL: No acute distress.  Appears well.  HEENT: MMM.  Vision and hearing grossly intact.  NECK: Supple.  No apparent JVD.  RESP:  No IWOB. Good air movement bilaterally. CVS:  RRR. Heart sounds normal.  ABD/GI/GU: Bowel sounds present. Soft. Non tender.  MSK/EXT:  Moves extremities. No apparent deformity or edema.  SKIN: no apparent skin lesion or wound NEURO: Awake, alert and oriented appropriately.  No apparent focal neuro deficit. PSYCH: Calm. Normal affect.   The results of significant diagnostics from this hospitalization (including imaging, microbiology, ancillary and laboratory) are listed below for reference.     Microbiology: Recent Results (from the past 240 hour(s))  SARS CORONAVIRUS 2 (TAT 6-24 HRS) Nasopharyngeal Nasopharyngeal Swab     Status: None   Collection Time: 07/09/19 10:27 AM   Specimen: Nasopharyngeal Swab  Result Value Ref Range Status   SARS Coronavirus 2 NEGATIVE NEGATIVE Final    Comment: (NOTE) SARS-CoV-2 target nucleic acids are NOT DETECTED. The SARS-CoV-2 RNA is generally detectable in upper and lower respiratory specimens  during the acute phase of infection. Negative results do not preclude SARS-CoV-2 infection, do not rule out co-infections with other pathogens, and should not be used as the sole basis for treatment or other patient management decisions. Negative results must be combined with clinical observations, patient history, and epidemiological information. The expected result is Negative. Fact Sheet for Patients: SugarRoll.be Fact Sheet for Healthcare Providers: https://www.woods-mathews.com/ This test is not yet approved or cleared by the [place] FDA and  has been authorized for detection and/or diagnosis of SARS-CoV-2 by FDA under an Emergency Use Authorization (EUA). This EUA will remain  in effect (meaning this test can be used) for the duration of the COVID-19 declaration under Section 56 4(b)(1) of the Act, 21 U.S.C. section 360bbb-3(b)(1), unless the authorization is terminated or revoked sooner. Performed at [facility], [place] [address], [place], [place] [address]   SARS CORONAVIRUS 2 (TAT 6-24 HRS) Nasopharyngeal Nasopharyngeal Swab     Status: None   Collection Time: 07/14/19  2:24 PM   Specimen: Nasopharyngeal Swab  Result Value Ref Range Status   SARS Coronavirus 2 NEGATIVE NEGATIVE Final    Comment: (NOTE) SARS-CoV-2 target nucleic acids are NOT DETECTED. The SARS-CoV-2 RNA is generally detectable in upper and lower respiratory specimens during the acute phase of infection. Negative results do not preclude SARS-CoV-2 infection, do not rule out co-infections with other pathogens, and should not be used as the sole basis for treatment or other patient management decisions. Negative results must be combined with clinical observations, patient history, and epidemiological information. The expected result is Negative. Fact Sheet for Patients: SugarRoll.be Fact Sheet for Healthcare  Providers: https://www.woods-mathews.com/ This test is not yet approved or cleared by the [place] FDA and  has been authorized for detection and/or diagnosis of SARS-CoV-2 by FDA under an Emergency Use Authorization (EUA). This EUA will remain  in effect (meaning this test can be used) for the duration of the COVID-19 declaration under Section 56 4(b)(1) of the Act, 21 U.S.C. section 360bbb-3(b)(1), unless the authorization is terminated or revoked sooner. Performed at [facility], [place] [address], [place], [place] [address]      Labs: BNP (last 3 results) Recent Labs    12/06/18 0911 06/30/19 1720 07/09/19 0726  BNP 132.3* 273.7* 123XX123*   Basic Metabolic Panel: Recent Labs  Lab 07/12/19 1227 07/13/19 0740 07/14/19 0734 07/15/19 0345 07/16/19 0342  NA 127* 130* 129* 121* 123*  K 5.2* 5.3* 5.6* 5.2* 5.0  CL 94* 93* 92* 84* 86*  CO2  28 30 30 29 31   GLUCOSE 103* 77 98 113* 99  BUN 14 8 18 18 20   CREATININE 0.59 0.61 0.84 0.87 0.73  CALCIUM 8.4* 8.7* 8.2* 8.2* 8.2*  MG  --   --   --  1.9 1.8   Liver Function Tests: Recent Labs  Lab 07/14/19 0734 07/15/19 0345  AST 12* 11*  ALT 14 13  ALKPHOS 77 69  BILITOT 0.2* 0.5  PROT 5.4* 5.7*  ALBUMIN 2.4* 2.6*   No results for input(s): LIPASE, AMYLASE in the last 168 hours. No results for input(s): AMMONIA in the last 168 hours. CBC: Recent Labs  Lab 07/10/19 0259 07/14/19 0734 07/15/19 0345 07/16/19 0342  WBC 4.4 5.5 4.8 5.2  NEUTROABS  --   --  2.9  --   HGB 11.7* 12.0 12.1 11.4*  HCT 34.4* 36.8 35.9* 34.3*  MCV 89.8 92.0 90.4 90.5  PLT 191 206 210 217   Cardiac Enzymes: No results for input(s): CKTOTAL, CKMB, CKMBINDEX, TROPONINI in the last 168 hours. BNP: Invalid input(s): POCBNP CBG: No results for input(s): GLUCAP in the last 168 hours. D-Dimer No results for input(s): DDIMER in the last 72 hours. Hgb A1c No results for input(s): HGBA1C in the last 72 hours. Lipid  Profile No results for input(s): CHOL, HDL, LDLCALC, TRIG, CHOLHDL, LDLDIRECT in the last 72 hours. Thyroid function studies No results for input(s): TSH, T4TOTAL, T3FREE, THYROIDAB in the last 72 hours.  Invalid input(s): FREET3 Anemia work up No results for input(s): VITAMINB12, FOLATE, FERRITIN, TIBC, IRON, RETICCTPCT in the last 72 hours. Urinalysis    Component Value Date/Time   COLORURINE YELLOW 07/02/2019 1048   APPEARANCEUR CLEAR 07/02/2019 1048   APPEARANCEUR Clear 06/22/2012 0618   LABSPEC 1.012 07/02/2019 1048   LABSPEC 1.003 06/22/2012 0618   PHURINE 6.0 07/02/2019 1048   GLUCOSEU NEGATIVE 07/02/2019 1048   GLUCOSEU Negative 06/22/2012 0618   GLUCOSEU NEGATIVE 04/09/2011 1058   HGBUR NEGATIVE 07/02/2019 1048   BILIRUBINUR NEGATIVE 07/02/2019 1048   BILIRUBINUR Negative 06/22/2012 0618   KETONESUR NEGATIVE 07/02/2019 1048   PROTEINUR NEGATIVE 07/02/2019 1048   UROBILINOGEN 1.0 08/18/2011 1805   NITRITE NEGATIVE 07/02/2019 1048   LEUKOCYTESUR NEGATIVE 07/02/2019 1048   LEUKOCYTESUR Trace 06/22/2012 0618   Sepsis Labs Invalid input(s): PROCALCITONIN,  WBC,  LACTICIDVEN   Time coordinating discharge: 45 minutes  SIGNED:  Mercy Riding, MD  [facility] 07/16/2019, 11:12 AM  If 7PM-7AM, please contact night-coverage [email or web address] Password [email or web address]

## 2019-07-16 NOTE — NC FL2 (Addendum)
[place] MEDICAID FL2 LEVEL OF CARE SCREENING TOOL     IDENTIFICATION  Patient Name: Natalie Thomas Birthdate: 23-May-1961 Sex: female Admission Date (Current Location): 07/09/2019  [place] and [place] Number:  Kathleen Argue [id number] [place] and Address:  [facility] [place], [place] [address] [place], [place] [address]      Provider Number: [id number]  Attending Physician Name and Address:  Mercy Riding, MD  Relative Name and Phone Number:       Current Level of Care: Hospital Recommended Level of Care: [place] Prior Approval Number:    Date Approved/Denied:   PASRR Number:    Discharge Plan: SNF    Current Diagnoses: Patient Active Problem List   Diagnosis Date Noted  . 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 Nausea   . Acute gastritis   . Abdominal pain, chronic, epigastric   . Hyponatremia 07/09/2019  . Acute diastolic CHF (congestive heart failure) ([place]) 07/09/2019  . Ventral hernia 07/09/2019  . Hypertensive crisis 05/01/2019  . Acute abdominal pain 03/15/2019  . Pleural effusion 03/13/2019  . Atelectasis 03/13/2019  . Chronic diastolic CHF (congestive heart failure) ([place]) 03/13/2019  . Type 2 diabetes mellitus without complication ([place]) [id number]  . Urinary tract bacterial infections 03/09/2019  . Acute cystitis without hematuria   . Constipation   . Acute hypoxemic respiratory failure ([place]) 12/03/2018  . Irritability 11/09/2018  . Aggressive behavior 11/09/2018  . Agitation   . S/P right hip fracture 11/07/2017  . Closed comminuted intertrochanteric fracture of right femur ([place]) 11/07/2017  . Anxiety and depression   . Dyspnea 10/16/2017  . Anemia 10/16/2017  . COPD exacerbation ([place]) 10/07/2017  . Hyperlipidemia 10/07/2017  . Acute on chronic respiratory failure with hypoxia and hypercapnia ([place]) 10/07/2017  . Incontinence of urine 08/14/2011  . Schizoaffective disorder, bipolar type ([place]) 07/05/2011  . Cough  05/08/2011  . Lumbar disc disease 04/09/2011  . Polycythemia secondary to smoking 04/09/2011  . HIP PAIN, LEFT 12/12/2007  . 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 DM (diabetes mellitus), type 2 ([place]) 04/08/2007  . MENOPAUSE, PREMATURE 04/08/2007  . Chronic hyponatremia 04/08/2007  . Essential hypertension 04/08/2007  . COPD (chronic obstructive pulmonary disease) ([place]) 04/08/2007  . GERD 04/08/2007  . Osteoporosis 04/08/2007    Orientation RESPIRATION BLADDER Height & Weight     Self, Time, Situation, Place  Normal Continent Weight: 205 lb 7.5 oz (93.2 kg) Height:  5\' 5"  (165.1 cm)  BEHAVIORAL SYMPTOMS/MOOD NEUROLOGICAL BOWEL NUTRITION STATUS      Continent Diet (see discharge summary)  AMBULATORY STATUS COMMUNICATION OF NEEDS Skin   Extensive Assist(utilizes wheelchair) Verbally Other (Comment)(MASD on abdomen)                       Personal Care Assistance Level of Assistance  Bathing, Feeding, Dressing Bathing Assistance: Limited assistance Feeding assistance: Independent Dressing Assistance: Limited assistance     Functional Limitations Info  Hearing, Sight, Speech Sight Info: Adequate Hearing Info: Adequate Speech Info: Adequate    SPECIAL CARE FACTORS FREQUENCY                       Contractures Contractures Info: Not present    Additional Factors Info  Code Status, Allergies, Psychotropic Code Status Info: Full Code Allergies Info: Clarithromycin, Penicillins Psychotropic Info: buPROPion (WELLBUTRIN XL) 24 hr tablet 150 mg daily PO; clonazePAM (KLONOPIN) tablet 1 mg  3x daily PO; lurasidone (LATUDA) tablet 20 mg 2x daily  PO; OLANZapine (ZYPREXA) tablet 5 mg 2x daily PO;         Current Medications (07/16/2019):  This is the current hospital active medication list Current Facility-Administered Medications  Medication Dose Route Frequency Provider Last Rate Last Admin  . acetaminophen (TYLENOL) tablet 650 mg  650 mg Oral Q6H PRN Fuller Plan A, MD   650 mg at 07/14/19 1535   Or  . acetaminophen (TYLENOL) suppository 650 mg  650 mg Rectal Q6H PRN Smith, Rondell A, MD      . albuterol (PROVENTIL) (2.5 MG/3ML) 0.083% nebulizer solution 2.5 mg  2.5 mg Nebulization Q4H PRN Smith, Rondell A, MD      . alum & mag hydroxide-simeth (MAALOX/MYLANTA) 200-200-20 MG/5ML suspension 15 mL  15 mL Oral Q6H PRN Fuller Plan A, MD   15 mL at 07/15/19 0408  . aspirin chewable tablet 81 mg  81 mg Oral Daily Fuller Plan A, MD   81 mg at 07/16/19 0937  . benzonatate (TESSALON) capsule 100 mg  100 mg Oral Q8H PRN Fuller Plan A, MD   100 mg at 07/13/19 1418  . buPROPion (WELLBUTRIN XL) 24 hr tablet 150 mg  150 mg Oral Daily Fuller Plan A, MD   150 mg at 07/16/19 0939  . calcium carbonate (TUMS - dosed in mg elemental calcium) chewable tablet 500 mg  500 mg Oral Q12H PRN Fuller Plan A, MD   500 mg at 07/14/19 1759  . clonazePAM (KLONOPIN) tablet 0.5 mg  0.5 mg Oral BID PRN Fuller Plan A, MD   0.5 mg at 07/15/19 1422  . clonazePAM (KLONOPIN) tablet 1 mg  1 mg Oral TID Fuller Plan A, MD   1 mg at 07/16/19 [id number]  . docusate sodium (COLACE) capsule 100 mg  100 mg Oral BID Fuller Plan A, MD   100 mg at 07/16/19 0937  . enoxaparin (LOVENOX) injection 40 mg  40 mg Subcutaneous Q24H Vena Rua, PA-C   40 mg at 07/15/19 1638  . famotidine (PEPCID) tablet 20 mg  20 mg Oral BID Debbe Odea, MD   20 mg at 07/16/19 0937  . feeding supplement (ENSURE ENLIVE) (ENSURE ENLIVE) liquid 237 mL  237 mL Oral Q24H Smith, Rondell A, MD   237 mL at 07/15/19 0959  . fludrocortisone (FLORINEF) tablet 0.1 mg  0.1 mg Oral Daily Wendee Beavers T, MD   0.1 mg at 07/16/19 0937  . fluticasone (FLONASE) 50 MCG/ACT nasal spray 2 spray  2 spray Each Nare BID Norval Morton, MD   2 spray at 07/16/19 0939  . fluticasone furoate-vilanterol (BREO ELLIPTA) 100-25 MCG/INH 1 puff  1 puff Inhalation Daily Fuller Plan A, MD   1 puff at 07/16/19 0743  . guaiFENesin  (MUCINEX) 12 hr tablet 1,200 mg  1,200 mg Oral Daily Smith, Rondell A, MD   1,200 mg at 07/16/19 0936  . hydrALAZINE (APRESOLINE) injection 10 mg  10 mg Intravenous Q4H PRN Fuller Plan A, MD   10 mg at 07/14/19 0930  . hydrALAZINE (APRESOLINE) tablet 25 mg  25 mg Oral Q8H Roney Jaffe, MD   25 mg at 07/16/19 0649  . hydrOXYzine (ATARAX/VISTARIL) tablet 50 mg  50 mg Oral TID Norval Morton, MD   50 mg at 07/16/19 [id number]  . ipratropium-albuterol (DUONEB) 0.5-2.5 (3) MG/3ML nebulizer solution 3 mL  3 mL Nebulization BID Gonfa, Taye T, MD      . loperamide (IMODIUM) capsule 2 mg  2 mg Oral Q6H  PRN Fuller Plan A, MD      . loratadine (CLARITIN) tablet 10 mg  10 mg Oral Daily Fuller Plan A, MD   10 mg at 07/16/19 0937  . lurasidone (LATUDA) tablet 20 mg  20 mg Oral BID Fuller Plan A, MD   20 mg at 07/16/19 [id number]  . Melatonin TABS 6 mg  6 mg Oral QHS Fuller Plan A, MD   6 mg at 07/15/19 2113  . metoprolol tartrate (LOPRESSOR) tablet 25 mg  25 mg Oral BID Lovey Newcomer T, NP   25 mg at 07/16/19 [id number]  . neomycin-bacitracin-polymyxin (NEOSPORIN) ointment packet 1 application  1 application Topical PRN Smith, Rondell A, MD      . nicotine (NICODERM CQ - dosed in mg/24 hr) patch 7 mg  7 mg Transdermal Daily Fuller Plan A, MD   7 mg at 07/16/19 0938  . NIFEdipine (PROCARDIA XL/NIFEDICAL-XL) 24 hr tablet 90 mg  90 mg Oral Daily Fuller Plan A, MD   90 mg at 07/16/19 0938  . nystatin (MYCOSTATIN/NYSTOP) topical powder 1 application  1 application Topical BID PRN Fuller Plan A, MD      . OLANZapine (ZYPREXA) tablet 5 mg  5 mg Oral BID Fuller Plan A, MD   5 mg at 07/16/19 [id number]  . ondansetron (ZOFRAN) tablet 4 mg  4 mg Oral Q6H PRN Debbe Odea, MD   4 mg at 07/15/19 1921   Or  . ondansetron [facility]) injection 4 mg  4 mg Intravenous Q6H PRN Debbe Odea, MD   4 mg at 07/16/19 0938  . oxyCODONE (Oxy IR/ROXICODONE) immediate release tablet 5 mg  5 mg Oral Q6H PRN Aline August, MD   5 mg  at 07/16/19 0258  . pantoprazole (PROTONIX) EC tablet 40 mg  40 mg Oral BID AC Debbe Odea, MD   40 mg at 07/16/19 0937  . polyethylene glycol (MIRALAX / GLYCOLAX) packet 17 g  17 g Oral Daily Fuller Plan A, MD   17 g at 07/16/19 [id number]  . sodium chloride (OCEAN) 0.65 % nasal spray 2 spray  2 spray Each Nare Q2H PRN Smith, Rondell A, MD      . sodium chloride flush (NS) 0.9 % injection 3 mL  3 mL Intravenous Q12H Smith, Rondell A, MD   3 mL at 07/16/19 0939  . sucralfate (CARAFATE) 1 GM/10ML suspension 1 g  1 g Oral TID WC & HS Wendee Beavers T, MD   1 g at 07/16/19 0939  . valproic acid (DEPAKENE) 250 MG capsule 500 mg  500 mg Oral TID Norval Morton, MD   500 mg at 07/16/19 [id number]     Discharge Medications: STOP taking these medications   aspirin 81 MG chewable tablet   benzonatate 100 MG capsule Commonly known as: TESSALON   Calcium Carbonate 500 MG Chew   citalopram 20 MG tablet Commonly known as: CELEXA   demeclocycline 150 MG tablet Commonly known as: DECLOMYCIN   feeding supplement (PRO-STAT SUGAR FREE 64) Liqd   gabapentin 100 MG capsule Commonly known as: NEURONTIN   lisinopril 40 MG tablet Commonly known as: ZESTRIL   methocarbamol 500 MG tablet Commonly known as: ROBAXIN   oxyCODONE 5 MG immediate release tablet Commonly known as: Oxy IR/ROXICODONE   sodium chloride 1 g tablet   Zinc Gluconate 100 MG Tabs     TAKE these medications   acetaminophen 325 MG tablet Commonly known as: TYLENOL Take 325 mg by mouth every 6 (six)  hours as needed for fever. Not to exceed 2000MG    acetaminophen 500 MG tablet Commonly known as: TYLENOL Take 1,000 mg by mouth every 8 (eight) hours as needed for mild pain or moderate pain. Not to exceed 3g/24h of tylenol from all sources.   albuterol (2.5 MG/3ML) 0.083% nebulizer solution Commonly known as: PROVENTIL Take 3 mLs (2.5 mg total) by nebulization every 4 (four) hours as needed for wheezing or shortness  of breath.   ANTACID LIQUID PO Take 30 mLs by mouth every 6 (six) hours as needed (heartburn).   Breo Ellipta 100-25 MCG/INH Aepb Generic drug: fluticasone furoate-vilanterol Inhale 1 puff into the lungs daily.   buPROPion 150 MG 24 hr tablet Commonly known as: WELLBUTRIN XL Take 150 mg by mouth daily.   Calcium 600+D 600-800 MG-UNIT Tabs Generic drug: Calcium Carb-Cholecalciferol Take 1 tablet by mouth daily.   clonazePAM 1 MG tablet Commonly known as: KLONOPIN Take 1 tablet (1 mg total) by mouth 2 (two) times daily. What changed:   when to take this  Another medication with the same name was removed. Continue taking this medication, and follow the directions you see here.   docusate sodium 100 MG capsule Commonly known as: COLACE Take 100 mg by mouth See admin instructions. Take one capsule (100 mg) by mouth every 12 hours, may also take one capsule (100 mg) twice daily as needed for constipation   feeding supplement (ENSURE ENLIVE) Liqd Take 237 mLs by mouth daily.   fludrocortisone 0.1 MG tablet Commonly known as: FLORINEF Take 1 tablet (0.1 mg total) by mouth 2 (two) times daily.   fluticasone 50 MCG/ACT nasal spray Commonly known as: FLONASE Place 2 sprays into both nostrils 2 (two) times daily.   furosemide 20 MG tablet Commonly known as: LASIX Take 20 mg by mouth daily.   guaiFENesin 600 MG 12 hr tablet Commonly known as: MUCINEX Take 1,200 mg by mouth daily. What changed: Another medication with the same name was removed. Continue taking this medication, and follow the directions you see here.   hydrOXYzine 25 MG capsule Commonly known as: VISTARIL Take 1 capsule (25 mg total) by mouth every 6 (six) hours as needed for anxiety or itching. What changed:   medication strength  how much to take  when to take this  reasons to take this   ipratropium-albuterol 0.5-2.5 (3) MG/3ML Soln Commonly known as: DUONEB Take 3 mLs by nebulization  3 (three) times daily.   loperamide 2 MG tablet Commonly known as: IMODIUM A-D Take 2 mg by mouth every 6 (six) hours as needed for diarrhea or loose stools.   loratadine 10 MG tablet Commonly known as: CLARITIN Take 10 mg by mouth daily.   lurasidone 20 MG Tabs tablet Commonly known as: LATUDA Take 1 tablet (20 mg total) by mouth 2 (two) times daily.   Melatonin 5 MG Tabs Take 5 mg by mouth at bedtime.   metoprolol tartrate 25 MG tablet Commonly known as: LOPRESSOR Take 1 tablet (25 mg total) by mouth 2 (two) times daily.   multivitamin with minerals Tabs tablet Take 1 tablet by mouth daily.   neomycin-bacitracin-polymyxin ointment Commonly known as: NEOSPORIN Apply 1 application topically as needed for wound care.   nicotine 7 mg/24hr patch Commonly known as: NICODERM CQ - dosed in mg/24 hr Place 7 mg onto the skin daily.   NIFEdipine 90 MG 24 hr tablet Commonly known as: PROCARDIA XL/NIFEDICAL-XL Take 90 mg by mouth daily.   nystatin powder  Generic drug: nystatin Apply 1 application topically 2 (two) times daily as needed (irritation). Apply topically to reddened area twice daily as needed for irritation   OLANZapine 5 MG tablet Commonly known as: ZYPREXA Take 1 tablet (5 mg total) by mouth 2 (two) times daily. What changed:   when to take this  reasons to take this  Another medication with the same name was removed. Continue taking this medication, and follow the directions you see here.   omeprazole 40 MG capsule Commonly known as: PRILOSEC Take 1 capsule (40 mg total) by mouth at bedtime.   ondansetron 8 MG tablet Commonly known as: ZOFRAN Take 8 mg by mouth every 6 (six) hours as needed for nausea or vomiting.   pantoprazole 40 MG tablet Commonly known as: Protonix Take 1 tablet (40 mg total) by mouth daily.   polyethylene glycol 17 g packet Commonly known as: MiraLax Take 17 g by mouth daily. What changed: additional  instructions   sodium chloride 0.65 % Soln nasal spray Commonly known as: OCEAN Place 2 sprays into both nostrils every 2 (two) hours as needed (dryness, irritation).   sucralfate 1 GM/10ML suspension Commonly known as: CARAFATE Take 10 mLs (1 g total) by mouth 4 (four) times daily -  with meals and at bedtime.   valproic acid 250 MG capsule Commonly known as: DEPAKENE Take 2 capsules (500 mg total) by mouth 2 (two) times daily. What changed: when to take this      Relevant Imaging Results:  Relevant Lab Results:   Additional Information SS# [id number]  [place], [place]

## 2019-07-17 LAB — BASIC METABOLIC PANEL
Anion gap: 10 (ref 5–15)
BUN: 23 mg/dL — ABNORMAL HIGH (ref 6–20)
CO2: 30 mmol/L (ref 22–32)
Calcium: 8.5 mg/dL — ABNORMAL LOW (ref 8.9–10.3)
Chloride: 87 mmol/L — ABNORMAL LOW (ref 98–111)
Creatinine, Ser: 0.65 mg/dL (ref 0.44–1.00)
GFR calc Af Amer: >60 mL/min (ref >60)
GFR calc non Af Amer: >60 mL/min (ref >60)
Glucose, Bld: 90 mg/dL (ref 70–99)
Potassium: 4.6 mmol/L (ref 3.5–5.1)
Sodium: 127 mmol/L — ABNORMAL LOW (ref 135–145)

## 2019-07-17 LAB — CBC
HCT: 38 % (ref 36.0–46.0)
Hemoglobin: 12.6 g/dL (ref 12.0–15.0)
MCH: 30.1 pg (ref 26.0–34.0)
MCHC: 33.2 g/dL (ref 30.0–36.0)
MCV: 90.9 fL (ref 80.0–100.0)
Platelets: 225 10*3/uL (ref 150–400)
RBC: 4.18 MIL/uL (ref 3.87–5.11)
RDW: 13.1 % (ref 11.5–15.5)
WBC: 5.4 10*3/uL (ref 4.0–10.5)
nRBC: 0 % (ref 0.0–0.2)

## 2019-07-17 LAB — MAGNESIUM: Magnesium: 1.8 mg/dL (ref 1.7–2.4)

## 2019-07-17 MED ORDER — FLUDROCORTISONE ACETATE 0.1 MG PO TABS: 0.1000 mg | ORAL_TABLET | Freq: Every day | ORAL | 1 refills | Status: DC

## 2019-07-17 NOTE — Care Management Important Message (Signed)
Important Message  Patient Details  Name: Natalie Thomas MRN: [id number] Date of Birth: 08/20/1960   Medicare Important Message Given:  Yes     Lance Galas 07/17/2019, 1:56 PM

## 2019-07-17 NOTE — Discharge Summary (Signed)
Physician Discharge Summary  Natalie Thomas [id number] DOB: 09-Nov-1960 DOA: 07/09/2019  PCP: System, Pcp Not In  Admit date: 07/09/2019 Discharge date: 07/17/2019  Admitted From: ALF Disposition: ALF  Recommendations for Outpatient Follow-up:  1. Follow ups as below.  2. Recommend referral to psychiatry outpatient. 3. Please obtain CBC/BMP/Mag in 1 week. 4. Please follow up on the following pending results: Aldosterone/renin activity  Home Health: None Equipment/Devices: None  Discharge Condition: Stable CODE STATUS: Full code   Contact information for follow-up providers    Sur[facility], [place]. Call.   Specialty: General Surgery Why: Call and schedule an appointment to discuss elective hernia repair if you would like to consider this.  Contact information: [place] [address] [phone]            Contact information for after-discharge care    [facility] .   Service: Skilled Nursing Contact information: [place] [phone]                  Hospital Course: 59 year old female with history of bipolar/schizoaffective disorder, hypertension, hyperlipidemia, diabetes mellitus type 2, COPD, chronic hypoxic respiratory failure on 2 L oxygen via nasal cannula, chronic hyponatraemia with recurrent hospitalizations for the same, chronic diastolic CHF, chronic abdominal pain presented to the hospital again with epigastric/abdominal pain along with nausea and dry heaves. She also has a history of abdominal hernia. She was found to be hyponatremic in the ED with a sodium of 118.  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  Nephrology recommended fluid restriction to 2 L a day and then discontinuing demeclocycline and oral sodium chloride.  Sodium initially improved to 129 before dropping back to 121.  She also  had a mild hyperkalemia.  She was started on Florinefwith improvement in her hyponatremia and hyperkalemia.  She is discharged on Florinef.  In regards to abdominal pain, CT abdomen and pelvis with ventral hernia without incarceration. GI and general surgery were were consulted. She underwent EGD on 07/11/2019 which showed diffuse gastritis and small hiatal hernia.  Biopsy obtained.  Pathology negative.  H. pylori test negative.  General surgery had nothing much to offer, and signed off.  She was started on PPI and Carafate and discharged home.  Encouraged to avoid NSAIDs and aspirin.  See individual problem list below for more on hospital course.  Discharge Diagnoses:  Hyponatremia: Na 125-130 (baseline)> 118 (admit)> 129> 121>123.  Concern about psychogenic polydipsia versus SIADH per nephrology.  Urine sodium slightly high.  P.o. NaCl and demeclocycline stopped by nephrology. Associated hyperkalemia also concerning for hypoaldosteronism.  Discontinued Celexa per psych recommendation.  Also discontinued lisinopril which could contribute to hyponatremia. -Continue 2L fluid restriction.   -Follow aldosterone/renin activities-pending at time of discharge. -Continue Florinef 0.1 mg daily. -Recheck BMP in 1 week.  Chronic diastolic CHF: Appears euvolemic.  On Lasix at home. -Continue home Lasix. -Fluid restriction as above. -Liberate sodium intake given hyponatremia  Chronic abdominal pain/gastritis/GERD/abdominal hernia: Had multiple CT scans that revealed ventral hernia without incarceration. EGD on 1/16 with diffuse gastritis.  -General surgery and GI signed off. -Continue Protonix 40 mg twice daily and Carafate 4 times daily. -Avoid NSAIDs.  Chronic COPD/chronic respiratory failure: On 2 L by nasal cannula at baseline.  Stable -Continue nebs and inhalers.  Essential hypertension: Normotensive -Continue home medications.  Discontinued lisinopril in the setting of  hyponatremia  Schizoaffective disorder/type I bipolar disorder/anxiety -Appreciate psych input-             -Discontinue Celexa 20 mg daily -Discontinued Gabapentin 100 mg BID -Continue Wellbutrin 150 mg daily -Continue Latuda 20 mg BID -Continue Depakote 500 mg TID -Continue Zyprexa 5 mg BID  -Discontinued Zyprexa 5 mg PRN  Class II obesity: BMI 34.19.    Discharge Instructions  Discharge Instructions    Diet general   Complete by: As directed    With fluid restriction to 2 L a day.   Increase activity slowly   Complete by: As directed      Allergies as of 07/17/2019      Reactions   Clarithromycin Itching   Penicillins Itching   Has tolerated cefazolin before Has patient had a PCN reaction causing immediate rash, facial/tongue/throat swelling, SOB or lightheadedness with hypotension: No Has patient had a PCN reaction causing severe rash involving mucus membranes or skin necrosis: No Has patient had a PCN reaction that required hospitalization: Unknown Has patient had a PCN reaction occurring within the last 10 years: No If all of the above answers are "NO", then may proceed with Cephalosporin use.      Medication List    STOP taking these medications   aspirin 81 MG chewable tablet   benzonatate 100 MG capsule Commonly known as: TESSALON   Calcium Carbonate 500 MG Chew   citalopram 20 MG tablet Commonly known as: CELEXA   demeclocycline 150 MG tablet Commonly known as: DECLOMYCIN   feeding supplement (PRO-STAT SUGAR FREE 64) Liqd   gabapentin 100 MG capsule Commonly known as: NEURONTIN   lisinopril 40 MG tablet Commonly known as: ZESTRIL   methocarbamol 500 MG tablet Commonly known as: ROBAXIN   omeprazole 40 MG capsule Commonly known as: PRILOSEC   oxyCODONE 5 MG immediate release tablet Commonly known as: Oxy IR/ROXICODONE   sodium chloride 1 g tablet   Zinc Gluconate 100 MG Tabs     TAKE these medications   acetaminophen 500 MG  tablet Commonly known as: TYLENOL Take 1,000 mg by mouth every 8 (eight) hours as needed for mild pain or moderate pain. Not to exceed 3g/24h of tylenol from all sources. What changed: Another medication with the same name was removed. Continue taking this medication, and follow the directions you see here.   albuterol (2.5 MG/3ML) 0.083% nebulizer solution Commonly known as: PROVENTIL Take 3 mLs (2.5 mg total) by nebulization every 4 (four) hours as needed for wheezing or shortness of breath.   ANTACID LIQUID PO Take 30 mLs by mouth every 6 (six) hours as needed (heartburn).   Breo Ellipta 100-25 MCG/INH Aepb Generic drug: fluticasone furoate-vilanterol Inhale 1 puff into the lungs daily.   buPROPion 150 MG 24 hr tablet Commonly known as: WELLBUTRIN XL Take 150 mg by mouth daily.   Calcium 600+D 600-800 MG-UNIT Tabs Generic drug: Calcium Carb-Cholecalciferol Take 1 tablet by mouth daily.   clonazePAM 1 MG tablet Commonly known as: KLONOPIN Take 1 tablet (1 mg total) by mouth 2 (two) times daily. What changed:   when to take this  Another medication with the same name was removed. Continue taking this medication, and follow the directions you see here.   docusate sodium 100 MG capsule Commonly known as: COLACE Take 100 mg by mouth See admin instructions. Take one capsule (100 mg) by mouth every 12 hours, may also take one capsule (100 mg) twice daily as needed for constipation   feeding supplement (  ENSURE ENLIVE) Liqd Take 237 mLs by mouth daily.   fludrocortisone 0.1 MG tablet Commonly known as: FLORINEF Take 1 tablet (0.1 mg total) by mouth daily.   fluticasone 50 MCG/ACT nasal spray Commonly known as: FLONASE Place 2 sprays into both nostrils 2 (two) times daily.   furosemide 20 MG tablet Commonly known as: LASIX Take 20 mg by mouth daily.   guaiFENesin 600 MG 12 hr tablet Commonly known as: MUCINEX Take 1,200 mg by mouth daily. What changed: Another  medication with the same name was removed. Continue taking this medication, and follow the directions you see here.   hydrOXYzine 25 MG capsule Commonly known as: VISTARIL Take 1 capsule (25 mg total) by mouth every 6 (six) hours as needed for anxiety or itching. What changed:   medication strength  how much to take  when to take this  reasons to take this   ipratropium-albuterol 0.5-2.5 (3) MG/3ML Soln Commonly known as: DUONEB Take 3 mLs by nebulization 3 (three) times daily.   loperamide 2 MG tablet Commonly known as: IMODIUM A-D Take 2 mg by mouth every 6 (six) hours as needed for diarrhea or loose stools.   loratadine 10 MG tablet Commonly known as: CLARITIN Take 10 mg by mouth daily.   lurasidone 20 MG Tabs tablet Commonly known as: LATUDA Take 1 tablet (20 mg total) by mouth 2 (two) times daily.   Melatonin 5 MG Tabs Take 5 mg by mouth at bedtime.   metoprolol tartrate 25 MG tablet Commonly known as: LOPRESSOR Take 1 tablet (25 mg total) by mouth 2 (two) times daily.   multivitamin with minerals Tabs tablet Take 1 tablet by mouth daily.   neomycin-bacitracin-polymyxin ointment Commonly known as: NEOSPORIN Apply 1 application topically as needed for wound care.   nicotine 7 mg/24hr patch Commonly known as: NICODERM CQ - dosed in mg/24 hr Place 7 mg onto the skin daily.   NIFEdipine 90 MG 24 hr tablet Commonly known as: PROCARDIA XL/NIFEDICAL-XL Take 90 mg by mouth daily.   nystatin powder Generic drug: nystatin Apply 1 application topically 2 (two) times daily as needed (irritation). Apply topically to reddened area twice daily as needed for irritation   OLANZapine 5 MG tablet Commonly known as: ZYPREXA Take 1 tablet (5 mg total) by mouth 2 (two) times daily. What changed:   when to take this  reasons to take this  Another medication with the same name was removed. Continue taking this medication, and follow the directions you see here.    ondansetron 8 MG tablet Commonly known as: ZOFRAN Take 8 mg by mouth every 6 (six) hours as needed for nausea or vomiting.   pantoprazole 40 MG tablet Commonly known as: Protonix Take 1 tablet (40 mg total) by mouth daily.   polyethylene glycol 17 g packet Commonly known as: MiraLax Take 17 g by mouth daily. What changed: additional instructions   sodium chloride 0.65 % Soln nasal spray Commonly known as: OCEAN Place 2 sprays into both nostrils every 2 (two) hours as needed (dryness, irritation).   sucralfate 1 GM/10ML suspension Commonly known as: CARAFATE Take 10 mLs (1 g total) by mouth 4 (four) times daily -  with meals and at bedtime.   valproic acid 250 MG capsule Commonly known as: DEPAKENE Take 2 capsules (500 mg total) by mouth 2 (two) times daily. What changed: when to take this       Consultations:  Nephrology  GI  General surgery  Procedures/Studies:  CT ABDOMEN PELVIS W CONTRAST  Result Date: 07/01/2019 CLINICAL DATA:  Abdominal pain and distension EXAM: CT ABDOMEN AND PELVIS WITH CONTRAST TECHNIQUE: Multidetector CT imaging of the abdomen and pelvis was performed using the standard protocol following bolus administration of intravenous contrast. CONTRAST:  150mL OMNIPAQUE IOHEXOL 300 MG/ML  SOLN COMPARISON:  CT 04/21/2019 FINDINGS: Lower chest: Bilateral small pleural effusions Hepatobiliary: No focal hepatic lesion. Postcholecystectomy. No biliary dilatation. Pancreas: Pancreas is normal. No ductal dilatation. No pancreatic inflammation. Spleen: Normal spleen Adrenals/urinary tract: Nonobstructing calculus in the mid LEFT kidney. Cortical calcification in the RIGHT kidney. Ureters are normal. There is thickening along the anterior RIGHT wall the bladder which is mild measuring 6 mm on image 65/2. Stomach/Bowel: Stomach, small-bowel and cecum are normal. The appendix is not identified but there is no pericecal inflammation to suggest appendicitis. The colon  and rectosigmoid colon are normal. Vascular/Lymphatic: Abdominal aorta is normal caliber with atherosclerotic calcification. There is no retroperitoneal or periportal lymphadenopathy. No pelvic lymphadenopathy. Reproductive: Uterus and necks are normal. Other: No free fluid. Musculoskeletal: Degenerative changes of the spine. Chronic compression fracture T12. IMPRESSION: 1. Small bilateral pleural effusions. 2. Mild thickening along the anterior RIGHT wall of the bladder. Recommend correlation with hematuria or signs of cystitis. 3. Aortic Atherosclerosis (ICD10-I70.0). Electronically Signed   By: Suzy Bouchard M.D.   On: 07/01/2019 20:41   DG Chest Port 1 View  Result Date: 07/09/2019 CLINICAL DATA:  Nausea, hernia history EXAM: PORTABLE CHEST 1 VIEW COMPARISON:  03/13/2019 and CT abdomen and pelvis of 07/01/2019 FINDINGS: Cardiomediastinal contours remain enlarged, accentuated by portable technique. The left hemidiaphragm is obscured likely related to mixture of airspace disease and pleural effusion. Mild increase in interstitial markings bilaterally. Signs of aortic atherosclerosis. Visualized skeletal structures are unremarkable. IMPRESSION: 1. Stable enlargement of the cardiomediastinal contours with presumed left effusion and basilar airspace disease. 2. Mild increase in interstitial markings may represent mild pulmonary edema. Electronically Signed   By: Zetta Bills M.D.   On: 07/09/2019 08:20   DG Abdomen Acute W/Chest  Result Date: 06/30/2019 CLINICAL DATA:  Abdominal pain EXAM: DG ABDOMEN ACUTE W/ 1V CHEST COMPARISON:  None. FINDINGS: There is no evidence of dilated bowel loops or free intraperitoneal air. Moderate stool burden. Cholecystectomy clips. Mild chronic interstitial prominence.  Stable cardiomegaly. Bilateral sacral plasty.  Partially imaged right femur fixation. IMPRESSION: No acute process. Electronically Signed   By: Macy Mis M.D.   On: 06/30/2019 17:19   DG Abd Portable  1 View  Result Date: 07/09/2019 CLINICAL DATA:  Nausea, history of hernia EXAM: PORTABLE ABDOMEN - 1 VIEW COMPARISON:  CT of 07/01/2019 FINDINGS: Scattered gas-filled loops of bowel throughout the abdomen. Imaging obtained in supine position. No signs of free air or definitive signs of obstruction on plain film radiograph. Signs of bilateral sacral plasty. Post op changes of cholecystectomy and prior femoral ORIF partially imaged. No acute bone process. Signs of vascular calcification. IMPRESSION: Scattered gas-filled loops of bowel throughout the abdomen. No signs of free air or definitive signs of obstruction on supine radiography. Electronically Signed   By: Zetta Bills M.D.   On: 07/09/2019 08:22   ECHOCARDIOGRAM COMPLETE  Result Date: 07/09/2019   ECHOCARDIOGRAM REPORT   Patient Name:   Natalie Thomas Date of Exam: 07/09/2019 Medical Rec #:  [id number]          Height:       65.0 in Accession #:    [id number]  Weight:       191.8 lb Date of Birth:  12-02-1960           BSA:          1.94 m Patient Age:    40 years           BP:           185/54 mmHg Patient Gender: F                  HR:           87 bpm. Exam Location:  Inpatient Procedure: 2D Echo Indications:    CHF-Acute Diastolic [id number] / [id number]  History:        Patient has prior history of Echocardiogram examinations, most                 recent 12/04/2018. COPD; Risk Factors:Dyslipidemia and Former                 Smoker. GERD>.  Sonographer:    Darlina Sicilian RDCS Referring Phys: [id number] [place]  1. Left ventricular ejection fraction, by visual estimation, is 60 to 65%. The left ventricle has normal function. Left ventricular septal wall thickness was moderately increased. Mildly increased left ventricular posterior wall thickness. There is moderately increased left ventricular hypertrophy.  2. Elevated left ventricular end-diastolic pressure.  3. Left ventricular diastolic parameters are consistent with Grade I  diastolic dysfunction (impaired relaxation).  4. The left ventricle demonstrates global hypokinesis.  5. Global right ventricle has normal systolic function.The right ventricular size is normal. No increase in right ventricular wall thickness.  6. Left atrial size was severely dilated.  7. Right atrial size was normal.  8. The mitral valve is normal in structure. Trivial mitral valve regurgitation. No evidence of mitral stenosis.  9. The tricuspid valve is normal in structure. 10. The aortic valve is normal in structure. Aortic valve regurgitation is not visualized. No evidence of aortic valve sclerosis or stenosis. 11. The pulmonic valve was normal in structure. Pulmonic valve regurgitation is not visualized. 12. TR signal is inadequate for assessing pulmonary artery systolic pressure. 13. The inferior vena cava is dilated in size with <50% respiratory variability, suggesting right atrial pressure of 15 mmHg. FINDINGS  Left Ventricle: Left ventricular ejection fraction, by visual estimation, is 60 to 65%. The left ventricle has normal function. The left ventricle demonstrates global hypokinesis. Mildly increased left ventricular posterior wall thickness. There is moderately increased left ventricular hypertrophy. Left ventricular diastolic parameters are consistent with Grade I diastolic dysfunction (impaired relaxation). Elevated left ventricular end-diastolic pressure. Right Ventricle: The right ventricular size is normal. No increase in right ventricular wall thickness. Global RV systolic function is has normal systolic function. Left Atrium: Left atrial size was severely dilated. Right Atrium: Right atrial size was normal in size Pericardium: There is no evidence of pericardial effusion. Mitral Valve: The mitral valve is normal in structure. Trivial mitral valve regurgitation. No evidence of mitral valve stenosis by observation. Tricuspid Valve: The tricuspid valve is normal in structure. Tricuspid valve  regurgitation is trivial. Aortic Valve: The aortic valve is normal in structure. Aortic valve regurgitation is not visualized. The aortic valve is structurally normal, with no evidence of sclerosis or stenosis. Pulmonic Valve: The pulmonic valve was normal in structure. Pulmonic valve regurgitation is not visualized. Pulmonic regurgitation is not visualized. Aorta: The aortic root, ascending aorta and aortic arch are all structurally normal, with no evidence of dilitation  or obstruction. Venous: The inferior vena cava is dilated in size with less than 50% respiratory variability, suggesting right atrial pressure of 15 mmHg. IAS/Shunts: No atrial level shunt detected by color flow Doppler. There is no evidence of a patent foramen ovale. No ventricular septal defect is seen or detected. There is no evidence of an atrial septal defect.  LEFT VENTRICLE PLAX 2D LVIDd:         4.80 cm  Diastology LVIDs:         2.70 cm  LV e' lateral:   5.11 cm/s LV PW:         0.85 cm  LV E/e' lateral: 17.6 LV IVS:        1.55 cm  LV e' medial:    5.22 cm/s LVOT diam:     2.10 cm  LV E/e' medial:  17.2 LV SV:         81 ml LV SV Index:   39.69 LVOT Area:     3.46 cm  RIGHT VENTRICLE TAPSE (M-mode): 1.6 cm LEFT ATRIUM             Index LA diam:        4.10 cm 2.11 cm/m LA Vol (A2C):   79.9 ml 41.12 ml/m LA Vol (A4C):   80.4 ml 41.37 ml/m LA Biplane Vol: 80.2 ml 41.27 ml/m  AORTIC VALVE LVOT Vmax:   104.00 cm/s LVOT Vmean:  60.500 cm/s LVOT VTI:    0.240 m  AORTA Ao Root diam: 3.15 cm MITRAL VALVE MV Area (PHT): 3.27 cm              SHUNTS MV PHT:        67.28 msec            Systemic VTI:  0.24 m MV Decel Time: 232 msec              Systemic Diam: 2.10 cm MV E velocity: 90.00 cm/s  103 cm/s MV A velocity: 103.00 cm/s 70.3 cm/s MV E/A ratio:  0.87        1.5  Skeet Latch MD Electronically signed by Skeet Latch MD Signature Date/Time: 07/09/2019/7:13:04 PM    Final    VAS [place] LOWER EXTREMITY VENOUS (DVT) (ONLY [facility] &  [facility])  Result Date: 07/09/2019  Lower Venous Study Indications: Pain, and Edema.  Comparison Study: Last LEV test done on 11/09/17, negative. Performing Technologist: Oda Cogan RDMS, RVT  Examination Guidelines: A complete evaluation includes B-mode imaging, spectral Doppler, color Doppler, and power Doppler as needed of all accessible portions of each vessel. Bilateral testing is considered an integral part of a complete examination. Limited examinations for reoccurring indications may be performed as noted.  +-----+---------------+---------+-----------+----------+--------------+ RIGHTCompressibilityPhasicitySpontaneityPropertiesThrombus Aging +-----+---------------+---------+-----------+----------+--------------+ CFV  Full           Yes      Yes                                 +-----+---------------+---------+-----------+----------+--------------+ SFJ  Full                                                        +-----+---------------+---------+-----------+----------+--------------+   +---------+---------------+---------+-----------+----------+--------------+ LEFT     CompressibilityPhasicitySpontaneityPropertiesThrombus Aging +---------+---------------+---------+-----------+----------+--------------+ CFV  Full           Yes                                          +---------+---------------+---------+-----------+----------+--------------+ SFJ      Full                                                        +---------+---------------+---------+-----------+----------+--------------+ FV Prox  Full                                                        +---------+---------------+---------+-----------+----------+--------------+ FV Mid   Full                                                        +---------+---------------+---------+-----------+----------+--------------+ FV DistalFull                                                         +---------+---------------+---------+-----------+----------+--------------+ PFV      Full                                                        +---------+---------------+---------+-----------+----------+--------------+ POP      Full           Yes      Yes                                 +---------+---------------+---------+-----------+----------+--------------+ PTV      Full                                                        +---------+---------------+---------+-----------+----------+--------------+ PERO     Full                                                        +---------+---------------+---------+-----------+----------+--------------+     Summary: Right: No evidence of common femoral vein obstruction. Left: There is no evidence of deep vein thrombosis in the lower extremity. No cystic structure found in the popliteal fossa.  *See table(s) above for measurements and observations. Electronically signed by Servando Snare MD on 07/09/2019 at 3:32:45 PM.    Final  Discharge Exam: Vitals:   07/17/19 0840 07/17/19 0941  BP: (!) 184/64 120/61  Pulse: (!) 57 (!) 59  Resp:    Temp:    SpO2:      GENERAL: No acute distress.  Appears well.  HEENT: MMM.  Vision and hearing grossly intact.  NECK: Supple.  No apparent JVD.  RESP:  No IWOB. Good air movement bilaterally. CVS:  RRR. Heart sounds normal.  ABD/GI/GU: Bowel sounds present. Soft. Non tender.  MSK/EXT:  Moves extremities. No apparent deformity or edema.  SKIN: no apparent skin lesion or wound NEURO: Awake, alert and oriented appropriately.  No apparent focal neuro deficit. PSYCH: Calm. Normal affect.   The results of significant diagnostics from this hospitalization (including imaging, microbiology, ancillary and laboratory) are listed below for reference.     Microbiology: Recent Results (from the past 240 hour(s))  SARS CORONAVIRUS 2 (TAT 6-24 HRS) Nasopharyngeal Nasopharyngeal Swab     Status:  None   Collection Time: 07/09/19 10:27 AM   Specimen: Nasopharyngeal Swab  Result Value Ref Range Status   SARS Coronavirus 2 NEGATIVE NEGATIVE Final    Comment: (NOTE) SARS-CoV-2 target nucleic acids are NOT DETECTED. The SARS-CoV-2 RNA is generally detectable in upper and lower respiratory specimens during the acute phase of infection. Negative results do not preclude SARS-CoV-2 infection, do not rule out co-infections with other pathogens, and should not be used as the sole basis for treatment or other patient management decisions. Negative results must be combined with clinical observations, patient history, and epidemiological information. The expected result is Negative. Fact Sheet for Patients: SugarRoll.be Fact Sheet for Healthcare Providers: https://www.woods-mathews.com/ This test is not yet approved or cleared by the [place] FDA and  has been authorized for detection and/or diagnosis of SARS-CoV-2 by FDA under an Emergency Use Authorization (EUA). This EUA will remain  in effect (meaning this test can be used) for the duration of the COVID-19 declaration under Section 56 4(b)(1) of the Act, 21 U.S.C. section 360bbb-3(b)(1), unless the authorization is terminated or revoked sooner. Performed at [facility], [place] [address] [address] [place], [place] [address]   SARS CORONAVIRUS 2 (TAT 6-24 HRS) Nasopharyngeal Nasopharyngeal Swab     Status: None   Collection Time: 07/14/19  2:24 PM   Specimen: Nasopharyngeal Swab  Result Value Ref Range Status   SARS Coronavirus 2 NEGATIVE NEGATIVE Final    Comment: (NOTE) SARS-CoV-2 target nucleic acids are NOT DETECTED. The SARS-CoV-2 RNA is generally detectable in upper and lower respiratory specimens during the acute phase of infection. Negative results do not preclude SARS-CoV-2 infection, do not rule out co-infections with other pathogens, and should not be used as the sole basis for  treatment or other patient management decisions. Negative results must be combined with clinical observations, patient history, and epidemiological information. The expected result is Negative. Fact Sheet for Patients: SugarRoll.be Fact Sheet for Healthcare Providers: https://www.woods-mathews.com/ This test is not yet approved or cleared by the [place] FDA and  has been authorized for detection and/or diagnosis of SARS-CoV-2 by FDA under an Emergency Use Authorization (EUA). This EUA will remain  in effect (meaning this test can be used) for the duration of the COVID-19 declaration under Section 56 4(b)(1) of the Act, 21 U.S.C. section 360bbb-3(b)(1), unless the authorization is terminated or revoked sooner. Performed at [facility], [place] [address], [place], [place] [address]   SARS CORONAVIRUS 2 (TAT 6-24 HRS) Nasopharyngeal Nasopharyngeal Swab     Status:  None   Collection Time: 07/16/19  4:13 PM   Specimen: Nasopharyngeal Swab  Result Value Ref Range Status   SARS Coronavirus 2 NEGATIVE NEGATIVE Final    Comment: (NOTE) SARS-CoV-2 target nucleic acids are NOT DETECTED. The SARS-CoV-2 RNA is generally detectable in upper and lower respiratory specimens during the acute phase of infection. Negative results do not preclude SARS-CoV-2 infection, do not rule out co-infections with other pathogens, and should not be used as the sole basis for treatment or other patient management decisions. Negative results must be combined with clinical observations, patient history, and epidemiological information. The expected result is Negative. Fact Sheet for Patients: SugarRoll.be Fact Sheet for Healthcare Providers: https://www.woods-mathews.com/ This test is not yet approved or cleared by the [place] FDA and  has been authorized for detection and/or diagnosis of SARS-CoV-2 by FDA under an  Emergency Use Authorization (EUA). This EUA will remain  in effect (meaning this test can be used) for the duration of the COVID-19 declaration under Section 56 4(b)(1) of the Act, 21 U.S.C. section 360bbb-3(b)(1), unless the authorization is terminated or revoked sooner. Performed at [facility], [place] [address] [place], [place] [address]      Labs: BNP (last 3 results) Recent Labs    12/06/18 0911 06/30/19 1720 07/09/19 0726  BNP 132.3* 273.7* 123XX123*   Basic Metabolic Panel: Recent Labs  Lab 07/13/19 0740 07/14/19 0734 07/15/19 0345 07/16/19 0342 07/17/19 0700  NA 130* 129* 121* 123* 127*  K 5.3* 5.6* 5.2* 5.0 4.6  CL 93* 92* 84* 86* 87*  CO2 30 30 29 31 30   GLUCOSE 77 98 113* 99 90  BUN 8 18 18 20  23*  CREATININE 0.61 0.84 0.87 0.73 0.65  CALCIUM 8.7* 8.2* 8.2* 8.2* 8.5*  MG  --   --  1.9 1.8 1.8   Liver Function Tests: Recent Labs  Lab 07/14/19 0734 07/15/19 0345  AST 12* 11*  ALT 14 13  ALKPHOS 77 69  BILITOT 0.2* 0.5  PROT 5.4* 5.7*  ALBUMIN 2.4* 2.6*   No results for input(s): LIPASE, AMYLASE in the last 168 hours. No results for input(s): AMMONIA in the last 168 hours. CBC: Recent Labs  Lab 07/14/19 0734 07/15/19 0345 07/16/19 0342 07/17/19 0700  WBC 5.5 4.8 5.2 5.4  NEUTROABS  --  2.9  --   --   HGB 12.0 12.1 11.4* 12.6  HCT 36.8 35.9* 34.3* 38.0  MCV 92.0 90.4 90.5 90.9  PLT 206 210 217 225   Cardiac Enzymes: No results for input(s): CKTOTAL, CKMB, CKMBINDEX, TROPONINI in the last 168 hours. BNP: Invalid input(s): POCBNP CBG: No results for input(s): GLUCAP in the last 168 hours. D-Dimer No results for input(s): DDIMER in the last 72 hours. Hgb A1c No results for input(s): HGBA1C in the last 72 hours. Lipid Profile No results for input(s): CHOL, HDL, LDLCALC, TRIG, CHOLHDL, LDLDIRECT in the last 72 hours. Thyroid function studies No results for input(s): TSH, T4TOTAL, T3FREE, THYROIDAB in the last 72 hours.  Invalid  input(s): FREET3 Anemia work up No results for input(s): VITAMINB12, FOLATE, FERRITIN, TIBC, IRON, RETICCTPCT in the last 72 hours. Urinalysis    Component Value Date/Time   COLORURINE YELLOW 07/02/2019 1048   APPEARANCEUR CLEAR 07/02/2019 1048   APPEARANCEUR Clear 06/22/2012 0618   LABSPEC 1.012 07/02/2019 1048   LABSPEC 1.003 06/22/2012 0618   PHURINE 6.0 07/02/2019 1048   GLUCOSEU NEGATIVE 07/02/2019 1048   GLUCOSEU Negative 06/22/2012 0618   GLUCOSEU NEGATIVE  04/09/2011 [place] 07/02/2019 1048   BILIRUBINUR NEGATIVE 07/02/2019 1048   BILIRUBINUR Negative 06/22/2012 0618   KETONESUR NEGATIVE 07/02/2019 1048   PROTEINUR NEGATIVE 07/02/2019 1048   UROBILINOGEN 1.0 08/18/2011 1805   NITRITE NEGATIVE 07/02/2019 1048   LEUKOCYTESUR NEGATIVE 07/02/2019 1048   LEUKOCYTESUR Trace 06/22/2012 0618   Sepsis Labs Invalid input(s): PROCALCITONIN,  WBC,  LACTICIDVEN   Time coordinating discharge: 45 minutes  SIGNED:  Mercy Riding, MD  [facility] 07/17/2019, 11:20 AM  If 7PM-7AM, please contact night-coverage [email or web address] Password [email or web address]

## 2019-07-17 NOTE — Progress Notes (Signed)
Report called to RN at [facility]

## 2019-07-17 NOTE — TOC Progression Note (Addendum)
Transition of Care [facility]) - Progression Note    Patient Details  Name: Natalie Thomas MRN: [id number] Date of Birth: [birth year]  Transition of Care [facility]) CM/SW [place], [place] Phone Number: 07/17/2019, 10:11 AM  Clinical Narrative:    10:50am- CSW received call back from pt guardian [name], she has selected [facility] for SNF placement as pt preferred SNF [facility] cannot offer at this time. CSW has spoken with Claiborne Billings at [facility]. They will accept 30 day waiver for PASRR once all information has been submitted. Will request MD update dc summary.   10:11am- CSW left message with pt guardian to provide SNF offers.  Pt has two offers at this time- [facility]. PASRR pending, MD to sign 30 day note.    Expected Discharge Plan: [place] Barriers to Discharge: Continued Medical Work up, [place] Forensic scientist)  Expected Discharge Plan and Services Expected Discharge Plan: [place] In-house Referral: Clinical Social Work Discharge Planning Services: CM Consult Post Acute Care Choice: [place] arrangements for the past 2 months: Assisted Living Facility(Memory Care) Expected Discharge Date: 07/16/19                 Readmission Risk Interventions Readmission Risk Prevention Plan 07/10/2019 07/01/2019 03/10/2019  Transportation Screening Complete Complete Complete  Medication Review (RN Care Manager) Referral to Pharmacy Referral to Pharmacy Complete  PCP or Specialist appointment within 3-5 days of discharge Complete Complete Complete  HRI or [place] Not Complete Not Complete Complete  [place] or Home Care Consult Pt Refusal Comments pt is resident of [facility] No orders yet, patient is from [place] Care/Counseling Consult Complete Complete Complete  Palliative Care Screening Not Applicable Not Applicable Not [place] Not Applicable Not Applicable Complete  Some recent data might be hidden

## 2019-07-17 NOTE — Social Work (Signed)
Clinical Social Worker facilitated patient discharge including contacting patient family and facility to confirm patient discharge plans.  Clinical information faxed to facility and family agreeable with plan.  CSW arranged ambulance transport via PTAR to [facility]. RN to call [phone]  with report prior to discharge.  Clinical Social Worker will sign off for now as social work intervention is no longer needed. Please consult us again if new need arises.  Ellar Hakala, MSW, LCSWA Clinical Social Worker   

## 2019-07-17 NOTE — TOC Transition Note (Signed)
Transition of Care [facility]) - CM/SW Discharge Note   Patient Details  Name: Natalie Thomas MRN: [id number] Date of Birth: 12/31/1960  [facility]) CM/SW Contact:  [place], [place] Phone Number: 07/17/2019, 12:51 PM   Clinical Narrative:    CSW spoke with pt guardian, confirmed choice.  PTAR papers completed. Waiver in place for [employer], clinicals submitted. PTAR called for 2pm.    Final next level of care: [place] Barriers to Discharge: Barriers Resolved   Patient Goals and CMS Choice Patient states their goals for this hospitalization and ongoing recovery are:: for her to return to [facility].gov Compare Post Acute Care list provided to:: Legal Guardian Choice offered to / list presented to : [place] / Guardian  Discharge Placement PASRR number recieved: (submitted to [facility] 1/22; waiver in place)            Patient chooses bed at: [facility] Patient to be transferred to facility by: [place] Name of family member notified: pt guardian [name] Patient and family notified of of transfer: 07/17/19  Discharge Plan and Services In-house Referral: Clinical Social Work Discharge Planning Services: [place] Acute Care Choice: [place]            Readmission Risk Interventions Readmission Risk Prevention Plan 07/10/2019 07/01/2019 03/10/2019  Transportation Screening Complete Complete Complete  Medication Review Press photographer) Referral to Pharmacy Referral to Pharmacy Complete  PCP or Specialist appointment within 3-5 days of discharge Complete Complete Complete  HRI or [place] Not Complete Not Complete Complete  [place] or Home Care Consult Pt Refusal Comments pt is resident of [facility] No orders yet, patient is from [facility] Care/Counseling Consult Complete Complete Complete  Palliative Care Screening Not Applicable Not Applicable Not [place] Not Applicable Not Applicable Complete  Some recent data might be hidden

## 2019-07-17 NOTE — Evaluation (Signed)
Occupational Therapy Evaluation Patient Details Name: Natalie Thomas MRN: [id number] DOB: 18-Jul-1960 Today's Date: 07/17/2019    History of Present Illness Pt is a 59 y/o female admitted from ALF secndary to abdominal pain. Pt is s/p EGD which showed diffuse gastritis and small hiatal hernia. PMH includes COPD on 2L of O2, schizoaffective disorder, HTN, DM, and dCHF.    Clinical Impression   Pt with decline in function and safety with ADLs and ADL mobility with impaired strength, balance, endurance and cognition. PTA, pt lived at an ALF and used a RW for mobility and min A with ADLs/selfcare. Pt currently requires min guard A for bed mobility, min A with LB ADLs, min guard A with grooming standing, min guard A with toileting and min guard A with mobility using RW. No further acute OT is indicated at this time, will defer any further OT intervention to the SNF    Follow Up Recommendations  SNF    Equipment Recommendations  None recommended by OT    Recommendations for Other Services       Precautions / Restrictions Precautions Precautions: Fall Restrictions Weight Bearing Restrictions: No      Mobility Bed Mobility Overal bed mobility: Needs Assistance Bed Mobility: Supine to Sit;Sit to Supine     Supine to sit: Min guard Sit to supine: Min guard   General bed mobility comments: Increased time  Transfers Overall transfer level: Needs assistance Equipment used: Rolling walker (2 wheeled) Transfers: Sit to/from Stand Sit to Stand: Min guard Stand pivot transfers: Min guard       General transfer comment: Min guard for for safety. Cues for safe hand placement.     Balance Overall balance assessment: Needs assistance Sitting-balance support: No upper extremity supported;Feet supported       Standing balance support: Bilateral upper extremity supported;During functional activity Standing balance-Leahy Scale: Poor                             ADL  either performed or assessed with clinical judgement   ADL Overall ADL's : Needs assistance/impaired Eating/Feeding: Set up;Sitting   Grooming: Wash/dry hands;Wash/dry face;Min guard;Standing   Upper Body Bathing: Set up;Supervision/ safety;Sitting   Lower Body Bathing: Minimal assistance   Upper Body Dressing : Set up;Supervision/safety;Sitting   Lower Body Dressing: Minimal assistance   Toilet Transfer: Min guard;Ambulation;RW   Toileting- Water quality scientist and Hygiene: Min guard;Sit to/from stand       Functional mobility during ADLs: Min guard;Rolling walker       Vision Patient Visual Report: No change from baseline       Perception     Praxis      Pertinent Vitals/Pain Pain Assessment: Faces Pain Score: 3  Pain Location: abdominal pain Pain Descriptors / Indicators: Grimacing;Guarding Pain Intervention(s): Monitored during session;Repositioned     Hand Dominance Right   Extremity/Trunk Assessment Upper Extremity Assessment Upper Extremity Assessment: Generalized weakness   Lower Extremity Assessment Lower Extremity Assessment: Defer to PT evaluation   Cervical / Trunk Assessment Cervical / Trunk Assessment: Kyphotic   Communication Communication Communication: No difficulties   Cognition Arousal/Alertness: Awake/alert Behavior During Therapy: WFL for tasks assessed/performed Overall Cognitive Status: No family/caregiver present to determine baseline cognitive functioning                                 General Comments: pt continuosly asked "can  you tell me anything about the place I'm going for rehab?" despiteOT explaining multilpe times that it is a SNF for ST rehab and that she will continue to have assist and care from the staff there   General Comments       Exercises     Shoulder Instructions      Home Living Family/patient expects to be discharged to:: Skilled nursing facility Living Arrangements: Other  (Comment)(ALF)                           Home Equipment: [place] - 2 wheels   Additional Comments: Pt from ALF, however, plans to go to SNF at d/c       Prior Functioning/Environment Level of Independence: Needs assistance  Gait / Transfers Assistance Needed: Pt reports using WC mainly and doing some walking with RW and assist.  ADL's / Homemaking Assistance Needed: Pt reports needing assist with ADLs.             OT Problem List: Decreased activity tolerance;Impaired balance (sitting and/or standing);Decreased cognition;Decreased safety awareness;Decreased knowledge of use of DME or AE;Pain;Cardiopulmonary status limiting activity      OT Treatment/Interventions:      OT Goals(Current goals can be found in the care plan section) Acute Rehab OT Goals Patient Stated Goal: go home OT Goal Formulation: With patient  OT Frequency:     Barriers to D/C:            Co-evaluation              AM-PAC OT "6 Clicks" Daily Activity     Outcome Measure Help from another person eating meals?: None Help from another person taking care of personal grooming?: A Little Help from another person toileting, which includes using toliet, bedpan, or urinal?: A Little Help from another person bathing (including washing, rinsing, drying)?: A Lot Help from another person to put on and taking off regular upper body clothing?: A Little Help from another person to put on and taking off regular lower body clothing?: A Little 6 Click Score: 18   End of Session Equipment Utilized During Treatment: Rolling walker;Gait belt;Oxygen  Activity Tolerance: Patient tolerated treatment well Patient left: in bed;with call bell/phone within reach;with bed alarm set  OT Visit Diagnosis: Unsteadiness on feet (R26.81);Other abnormalities of gait and mobility (R26.89);Muscle weakness (generalized) (M62.81);Other symptoms and signs involving cognitive function;Pain Pain - part of body: (abdomen)                 Time: [id number] OT Time Calculation (min): 26 min Charges:  OT General Charges $OT Visit: 1 Visit OT Evaluation $OT Eval Moderate Complexity: 1 Mod OT Treatments $Self Care/Home Management : 8-22 mins    Britt Bottom 07/17/2019, 2:34 PM

## 2019-07-21 LAB — ALDOSTERONE + RENIN ACTIVITY W/ RATIO
ALDO / PRA Ratio: 9 (ref 0.0–30.0)
Aldosterone: 7.2 ng/dL (ref 0.0–30.0)
PRA LC/MS/MS: 0.803 ng/mL/hr (ref 0.167–5.380)

## 2019-09-04 ENCOUNTER — Inpatient Hospital Stay
Admission: EM | Admit: 2019-09-04 | Discharge: 2019-09-08 | DRG: 177 | Disposition: A | Discharge status: Skilled Nursing Facility | Payer: Medicare Other | Source: Skilled Nursing Facility | Attending: Internal Medicine | Admitting: Internal Medicine

## 2019-09-04 ENCOUNTER — Encounter: Payer: Self-pay | Admitting: Emergency Medicine

## 2019-09-04 ENCOUNTER — Emergency Department: Payer: Medicare Other

## 2019-09-04 ENCOUNTER — Other Ambulatory Visit: Payer: Self-pay

## 2019-09-04 DIAGNOSIS — E871 Hypo-osmolality and hyponatremia: Secondary | ICD-10-CM | POA: Diagnosis present

## 2019-09-04 DIAGNOSIS — K439 Ventral hernia without obstruction or gangrene: Secondary | ICD-10-CM | POA: Diagnosis present

## 2019-09-04 DIAGNOSIS — E873 Alkalosis: Secondary | ICD-10-CM | POA: Diagnosis present

## 2019-09-04 DIAGNOSIS — F329 Major depressive disorder, single episode, unspecified: Secondary | ICD-10-CM | POA: Diagnosis present

## 2019-09-04 DIAGNOSIS — J9622 Acute and chronic respiratory failure with hypercapnia: Secondary | ICD-10-CM | POA: Diagnosis present

## 2019-09-04 DIAGNOSIS — E785 Hyperlipidemia, unspecified: Secondary | ICD-10-CM | POA: Diagnosis present

## 2019-09-04 DIAGNOSIS — R0602 Shortness of breath: Secondary | ICD-10-CM | POA: Diagnosis present

## 2019-09-04 DIAGNOSIS — Z6839 Body mass index (BMI) 39.0-39.9, adult: Secondary | ICD-10-CM

## 2019-09-04 DIAGNOSIS — I5033 Acute on chronic diastolic (congestive) heart failure: Secondary | ICD-10-CM | POA: Diagnosis present

## 2019-09-04 DIAGNOSIS — M810 Age-related osteoporosis without current pathological fracture: Secondary | ICD-10-CM | POA: Diagnosis present

## 2019-09-04 DIAGNOSIS — Z79890 Hormone replacement therapy (postmenopausal): Secondary | ICD-10-CM

## 2019-09-04 DIAGNOSIS — Z5329 Procedure and treatment not carried out because of patient's decision for other reasons: Secondary | ICD-10-CM | POA: Diagnosis present

## 2019-09-04 DIAGNOSIS — J1282 Pneumonia due to coronavirus disease 2019: Secondary | ICD-10-CM | POA: Diagnosis present

## 2019-09-04 DIAGNOSIS — E119 Type 2 diabetes mellitus without complications: Secondary | ICD-10-CM | POA: Diagnosis present

## 2019-09-04 DIAGNOSIS — E669 Obesity, unspecified: Secondary | ICD-10-CM

## 2019-09-04 DIAGNOSIS — Z79899 Other long term (current) drug therapy: Secondary | ICD-10-CM | POA: Diagnosis not present

## 2019-09-04 DIAGNOSIS — J9621 Acute and chronic respiratory failure with hypoxia: Secondary | ICD-10-CM | POA: Diagnosis present

## 2019-09-04 DIAGNOSIS — G8929 Other chronic pain: Secondary | ICD-10-CM | POA: Diagnosis present

## 2019-09-04 DIAGNOSIS — J440 Chronic obstructive pulmonary disease with acute lower respiratory infection: Secondary | ICD-10-CM | POA: Diagnosis present

## 2019-09-04 DIAGNOSIS — F17210 Nicotine dependence, cigarettes, uncomplicated: Secondary | ICD-10-CM | POA: Diagnosis present

## 2019-09-04 DIAGNOSIS — Z6841 Body Mass Index (BMI) 40.0 and over, adult: Secondary | ICD-10-CM | POA: Diagnosis not present

## 2019-09-04 DIAGNOSIS — K219 Gastro-esophageal reflux disease without esophagitis: Secondary | ICD-10-CM | POA: Diagnosis present

## 2019-09-04 DIAGNOSIS — F250 Schizoaffective disorder, bipolar type: Secondary | ICD-10-CM | POA: Diagnosis present

## 2019-09-04 DIAGNOSIS — I110 Hypertensive heart disease with heart failure: Secondary | ICD-10-CM | POA: Diagnosis present

## 2019-09-04 DIAGNOSIS — J441 Chronic obstructive pulmonary disease with (acute) exacerbation: Secondary | ICD-10-CM | POA: Diagnosis present

## 2019-09-04 DIAGNOSIS — F259 Schizoaffective disorder, unspecified: Secondary | ICD-10-CM | POA: Diagnosis not present

## 2019-09-04 DIAGNOSIS — Z880 Allergy status to penicillin: Secondary | ICD-10-CM

## 2019-09-04 DIAGNOSIS — I248 Other forms of acute ischemic heart disease: Secondary | ICD-10-CM | POA: Diagnosis present

## 2019-09-04 DIAGNOSIS — Z881 Allergy status to other antibiotic agents status: Secondary | ICD-10-CM

## 2019-09-04 DIAGNOSIS — U071 COVID-19: Principal | ICD-10-CM

## 2019-09-04 DIAGNOSIS — I10 Essential (primary) hypertension: Secondary | ICD-10-CM

## 2019-09-04 DIAGNOSIS — R0902 Hypoxemia: Secondary | ICD-10-CM

## 2019-09-04 DIAGNOSIS — Z8544 Personal history of malignant neoplasm of other female genital organs: Secondary | ICD-10-CM

## 2019-09-04 LAB — COMPREHENSIVE METABOLIC PANEL
ALT: 9 U/L (ref 0–44)
AST: 14 U/L — ABNORMAL LOW (ref 15–41)
Albumin: 3.3 g/dL — ABNORMAL LOW (ref 3.5–5.0)
Alkaline Phosphatase: 54 U/L (ref 38–126)
Anion gap: 5 (ref 5–15)
BUN: 14 mg/dL (ref 6–20)
CO2: 39 mmol/L — ABNORMAL HIGH (ref 22–32)
Calcium: 8.5 mg/dL — ABNORMAL LOW (ref 8.9–10.3)
Chloride: 86 mmol/L — ABNORMAL LOW (ref 98–111)
Creatinine, Ser: 0.45 mg/dL (ref 0.44–1.00)
GFR calc Af Amer: >60 mL/min (ref >60)
GFR calc non Af Amer: >60 mL/min (ref >60)
Glucose, Bld: 123 mg/dL — ABNORMAL HIGH (ref 70–99)
Potassium: 4.1 mmol/L (ref 3.5–5.1)
Sodium: 130 mmol/L — ABNORMAL LOW (ref 135–145)
Total Bilirubin: 0.2 mg/dL — ABNORMAL LOW (ref 0.3–1.2)
Total Protein: 6.6 g/dL (ref 6.5–8.1)

## 2019-09-04 LAB — CBC WITH DIFFERENTIAL/PLATELET
Abs Immature Granulocytes: 0.03 10*3/uL (ref 0.00–0.07)
Basophils Absolute: 0 10*3/uL (ref 0.0–0.1)
Basophils Relative: 0 %
Eosinophils Absolute: 0 10*3/uL (ref 0.0–0.5)
Eosinophils Relative: 0 %
HCT: 41.1 % (ref 36.0–46.0)
Hemoglobin: 12.5 g/dL (ref 12.0–15.0)
Immature Granulocytes: 0 %
Lymphocytes Relative: 10 %
Lymphs Abs: 0.7 10*3/uL (ref 0.7–4.0)
MCH: 29.3 pg (ref 26.0–34.0)
MCHC: 30.4 g/dL (ref 30.0–36.0)
MCV: 96.3 fL (ref 80.0–100.0)
Monocytes Absolute: 0.6 10*3/uL (ref 0.1–1.0)
Monocytes Relative: 8 %
Neutro Abs: 5.8 10*3/uL (ref 1.7–7.7)
Neutrophils Relative %: 82 %
Platelets: 127 10*3/uL — ABNORMAL LOW (ref 150–400)
RBC: 4.27 MIL/uL (ref 3.87–5.11)
RDW: 13.6 % (ref 11.5–15.5)
WBC: 7.2 10*3/uL (ref 4.0–10.5)
nRBC: 0 % (ref 0.0–0.2)

## 2019-09-04 LAB — BLOOD GAS, VENOUS
Acid-Base Excess: 15.2 mmol/L — ABNORMAL HIGH (ref 0.0–2.0)
Bicarbonate: 46.6 mmol/L — ABNORMAL HIGH (ref 20.0–28.0)
FIO2: 50
O2 Saturation: 66.6 %
Patient temperature: 37
pCO2, Ven: 97 mmHg (ref 44.0–60.0)
pH, Ven: 7.29 (ref 7.250–7.430)
pO2, Ven: 39 mmHg (ref 32.0–45.0)

## 2019-09-04 LAB — POC SARS CORONAVIRUS 2 AG: SARS Coronavirus 2 Ag: NEGATIVE

## 2019-09-04 LAB — GLUCOSE, CAPILLARY: Glucose-Capillary: 138 mg/dL — ABNORMAL HIGH (ref 70–99)

## 2019-09-04 LAB — HEMOGLOBIN A1C
Hgb A1c MFr Bld: 5 % (ref 4.8–5.6)
Mean Plasma Glucose: 96.8 mg/dL

## 2019-09-04 LAB — LACTATE DEHYDROGENASE: LDH: 155 U/L (ref 98–192)

## 2019-09-04 LAB — RESPIRATORY PANEL BY RT PCR (FLU A&B, COVID)
Influenza A by PCR: NEGATIVE
Influenza B by PCR: NEGATIVE
SARS Coronavirus 2 by RT PCR: NEGATIVE

## 2019-09-04 LAB — BRAIN NATRIURETIC PEPTIDE: B Natriuretic Peptide: 1205 pg/mL — ABNORMAL HIGH (ref 0.0–100.0)

## 2019-09-04 LAB — BLOOD GAS, ARTERIAL
Acid-Base Excess: 20.4 mmol/L — ABNORMAL HIGH (ref 0.0–2.0)
Bicarbonate: 48.7 mmol/L — ABNORMAL HIGH (ref 20.0–28.0)
O2 Saturation: 94.6 %
Patient temperature: 37
pCO2 arterial: 70 mmHg (ref 32.0–48.0)
pH, Arterial: 7.45 (ref 7.350–7.450)
pO2, Arterial: 70 mmHg — ABNORMAL LOW (ref 83.0–108.0)

## 2019-09-04 LAB — C-REACTIVE PROTEIN: CRP: 0.8 mg/dL (ref <1.0)

## 2019-09-04 LAB — MRSA PCR SCREENING: MRSA by PCR: POSITIVE — AB

## 2019-09-04 LAB — PROCALCITONIN: Procalcitonin: <0.1 ng/mL

## 2019-09-04 LAB — LACTIC ACID, PLASMA: Lactic Acid, Venous: 0.9 mmol/L (ref 0.5–1.9)

## 2019-09-04 LAB — TROPONIN I (HIGH SENSITIVITY)
Troponin I (High Sensitivity): 41 ng/L — ABNORMAL HIGH (ref <18)
Troponin I (High Sensitivity): 44 ng/L — ABNORMAL HIGH (ref <18)

## 2019-09-04 LAB — FIBRIN DERIVATIVES D-DIMER (ARMC ONLY): Fibrin derivatives D-dimer (ARMC): 1028.78 ng/mL (FEU) — ABNORMAL HIGH (ref 0.00–499.00)

## 2019-09-04 LAB — ABO/RH: ABO/RH(D): A POS

## 2019-09-04 LAB — FERRITIN: Ferritin: 30 ng/mL (ref 11–307)

## 2019-09-04 MED ORDER — POLYETHYLENE GLYCOL 3350 17 G PO PACK
17.0000 g | PACK | Freq: Every day | ORAL | Status: DC
  Administered 2019-09-06 – 2019-09-08 (×3): 17 g via ORAL
  Filled 2019-09-04 (×3): qty 1

## 2019-09-04 MED ORDER — STERILE WATER FOR INJECTION IJ SOLN
INTRAMUSCULAR | Status: AC
  Administered 2019-09-04: 10 mL
  Filled 2019-09-04: qty 10

## 2019-09-04 MED ORDER — CALCIUM CARBONATE-VITAMIN D 500-200 MG-UNIT PO TABS
1.0000 | ORAL_TABLET | Freq: Every day | ORAL | Status: DC
  Administered 2019-09-05 – 2019-09-08 (×4): 1 via ORAL
  Filled 2019-09-04 (×4): qty 1

## 2019-09-04 MED ORDER — ASCORBIC ACID 500 MG PO TABS
500.0000 mg | ORAL_TABLET | Freq: Every day | ORAL | Status: DC
  Administered 2019-09-05 – 2019-09-08 (×4): 500 mg via ORAL
  Filled 2019-09-04 (×4): qty 1

## 2019-09-04 MED ORDER — INSULIN ASPART 100 UNIT/ML ~~LOC~~ SOLN
0.0000 [IU] | Freq: Three times a day (TID) | SUBCUTANEOUS | Status: DC
  Administered 2019-09-05: 2 [IU] via SUBCUTANEOUS
  Administered 2019-09-06: 3 [IU] via SUBCUTANEOUS
  Administered 2019-09-06 – 2019-09-08 (×3): 2 [IU] via SUBCUTANEOUS
  Filled 2019-09-04 (×5): qty 1

## 2019-09-04 MED ORDER — ORAL CARE MOUTH RINSE
15.0000 mL | Freq: Two times a day (BID) | OROMUCOSAL | Status: DC
  Administered 2019-09-04 – 2019-09-06 (×3): 15 mL via OROMUCOSAL

## 2019-09-04 MED ORDER — SODIUM CHLORIDE 0.9 % IV SOLN
200.0000 mg | Freq: Once | INTRAVENOUS | Status: AC
  Administered 2019-09-04: 200 mg via INTRAVENOUS
  Filled 2019-09-04: qty 40

## 2019-09-04 MED ORDER — ACETAZOLAMIDE SODIUM 500 MG IJ SOLR
500.0000 mg | Freq: Once | INTRAMUSCULAR | Status: AC
  Administered 2019-09-04: 500 mg via INTRAVENOUS
  Filled 2019-09-04: qty 500

## 2019-09-04 MED ORDER — DEXAMETHASONE SODIUM PHOSPHATE 10 MG/ML IJ SOLN
8.0000 mg | Freq: Once | INTRAMUSCULAR | Status: AC
  Administered 2019-09-04: 8 mg via INTRAVENOUS
  Filled 2019-09-04: qty 1

## 2019-09-04 MED ORDER — GUAIFENESIN-DM 100-10 MG/5ML PO SYRP: 10.0000 mL | ORAL_SOLUTION | ORAL | Status: DC | PRN

## 2019-09-04 MED ORDER — HALOPERIDOL LACTATE 5 MG/ML IJ SOLN
2.0000 mg | Freq: Four times a day (QID) | INTRAMUSCULAR | Status: DC | PRN
  Administered 2019-09-04: 2 mg via INTRAVENOUS
  Filled 2019-09-04: qty 1

## 2019-09-04 MED ORDER — ONDANSETRON HCL 4 MG PO TABS: 4.0000 mg | ORAL_TABLET | Freq: Four times a day (QID) | ORAL | Status: DC | PRN

## 2019-09-04 MED ORDER — CHLORHEXIDINE GLUCONATE 0.12 % MT SOLN
15.0000 mL | Freq: Two times a day (BID) | OROMUCOSAL | Status: DC
  Administered 2019-09-04 – 2019-09-08 (×7): 15 mL via OROMUCOSAL
  Filled 2019-09-04 (×8): qty 15

## 2019-09-04 MED ORDER — GUAIFENESIN ER 600 MG PO TB12
1200.0000 mg | ORAL_TABLET | Freq: Every day | ORAL | Status: DC
  Administered 2019-09-05 – 2019-09-08 (×4): 1200 mg via ORAL
  Filled 2019-09-04 (×4): qty 2

## 2019-09-04 MED ORDER — NIFEDIPINE ER OSMOTIC RELEASE 90 MG PO TB24: 90.0000 mg | ORAL_TABLET | Freq: Every day | ORAL | Status: DC

## 2019-09-04 MED ORDER — METOPROLOL TARTRATE 25 MG PO TABS
25.0000 mg | ORAL_TABLET | Freq: Two times a day (BID) | ORAL | Status: DC
  Administered 2019-09-04 – 2019-09-08 (×3): 25 mg via ORAL
  Filled 2019-09-04 (×7): qty 1

## 2019-09-04 MED ORDER — DEXAMETHASONE SODIUM PHOSPHATE 10 MG/ML IJ SOLN
6.0000 mg | INTRAMUSCULAR | Status: DC
  Administered 2019-09-05 – 2019-09-07 (×3): 6 mg via INTRAVENOUS
  Filled 2019-09-04: qty 1
  Filled 2019-09-04 (×2): qty 0.6

## 2019-09-04 MED ORDER — NYSTATIN 100000 UNIT/GM EX POWD
1.0000 "application " | Freq: Two times a day (BID) | CUTANEOUS | Status: DC | PRN
  Filled 2019-09-04: qty 15

## 2019-09-04 MED ORDER — ONDANSETRON HCL 4 MG/2ML IJ SOLN: 4.0000 mg | Freq: Four times a day (QID) | INTRAMUSCULAR | Status: DC | PRN

## 2019-09-04 MED ORDER — HYDRALAZINE HCL 20 MG/ML IJ SOLN
10.0000 mg | INTRAMUSCULAR | Status: DC | PRN
  Filled 2019-09-04: qty 1

## 2019-09-04 MED ORDER — ADULT MULTIVITAMIN W/MINERALS CH
1.0000 | ORAL_TABLET | Freq: Every day | ORAL | Status: DC
  Administered 2019-09-05 – 2019-09-08 (×4): 1 via ORAL
  Filled 2019-09-04 (×4): qty 1

## 2019-09-04 MED ORDER — BUPROPION HCL ER (XL) 150 MG PO TB24
150.0000 mg | ORAL_TABLET | Freq: Every day | ORAL | Status: DC
  Administered 2019-09-05 – 2019-09-08 (×4): 150 mg via ORAL
  Filled 2019-09-04 (×5): qty 1

## 2019-09-04 MED ORDER — ENOXAPARIN SODIUM 40 MG/0.4ML ~~LOC~~ SOLN
40.0000 mg | Freq: Two times a day (BID) | SUBCUTANEOUS | Status: DC
  Administered 2019-09-04 – 2019-09-08 (×8): 40 mg via SUBCUTANEOUS
  Filled 2019-09-04 (×8): qty 0.4

## 2019-09-04 MED ORDER — HYDRALAZINE HCL 20 MG/ML IJ SOLN
10.0000 mg | Freq: Four times a day (QID) | INTRAMUSCULAR | Status: DC | PRN
  Administered 2019-09-04: 10 mg via INTRAVENOUS
  Filled 2019-09-04: qty 1

## 2019-09-04 MED ORDER — HYDROCOD POLST-CPM POLST ER 10-8 MG/5ML PO SUER
5.0000 mL | Freq: Two times a day (BID) | ORAL | Status: DC | PRN
  Administered 2019-09-05 (×2): 5 mL via ORAL
  Filled 2019-09-04 (×3): qty 5

## 2019-09-04 MED ORDER — SUCRALFATE 1 GM/10ML PO SUSP
1.0000 g | Freq: Three times a day (TID) | ORAL | Status: DC
  Administered 2019-09-05 – 2019-09-08 (×13): 1 g via ORAL
  Filled 2019-09-04 (×16): qty 10

## 2019-09-04 MED ORDER — FUROSEMIDE 10 MG/ML IJ SOLN
40.0000 mg | Freq: Once | INTRAMUSCULAR | Status: AC
  Administered 2019-09-04: 40 mg via INTRAVENOUS
  Filled 2019-09-04: qty 4

## 2019-09-04 MED ORDER — ALBUTEROL SULFATE HFA 108 (90 BASE) MCG/ACT IN AERS
2.0000 | INHALATION_SPRAY | RESPIRATORY_TRACT | Status: DC | PRN
  Administered 2019-09-05 (×2): 2 via RESPIRATORY_TRACT
  Filled 2019-09-04 (×2): qty 6.7

## 2019-09-04 MED ORDER — INSULIN ASPART 100 UNIT/ML ~~LOC~~ SOLN: 0.0000 [IU] | Freq: Every day | SUBCUTANEOUS | Status: DC

## 2019-09-04 MED ORDER — CHLORHEXIDINE GLUCONATE CLOTH 2 % EX PADS
6.0000 | MEDICATED_PAD | Freq: Every day | CUTANEOUS | Status: DC
  Administered 2019-09-04 – 2019-09-08 (×5): 6 via TOPICAL

## 2019-09-04 MED ORDER — DOCUSATE SODIUM 100 MG PO CAPS
100.0000 mg | ORAL_CAPSULE | Freq: Two times a day (BID) | ORAL | Status: DC
  Administered 2019-09-04 – 2019-09-08 (×8): 100 mg via ORAL
  Filled 2019-09-04 (×8): qty 1

## 2019-09-04 MED ORDER — NITROGLYCERIN 2 % TD OINT
2.0000 [in_us] | TOPICAL_OINTMENT | Freq: Once | TRANSDERMAL | Status: AC
  Administered 2019-09-04: 2 [in_us] via TOPICAL
  Filled 2019-09-04: qty 1

## 2019-09-04 MED ORDER — ZINC SULFATE 220 (50 ZN) MG PO CAPS
220.0000 mg | ORAL_CAPSULE | Freq: Every day | ORAL | Status: DC
  Administered 2019-09-05 – 2019-09-08 (×4): 220 mg via ORAL
  Filled 2019-09-04 (×4): qty 1

## 2019-09-04 MED ORDER — PANTOPRAZOLE SODIUM 40 MG PO TBEC
40.0000 mg | DELAYED_RELEASE_TABLET | Freq: Every day | ORAL | Status: DC
  Administered 2019-09-05 – 2019-09-08 (×4): 40 mg via ORAL
  Filled 2019-09-04 (×4): qty 1

## 2019-09-04 MED ORDER — HYDRALAZINE HCL 20 MG/ML IJ SOLN
10.0000 mg | INTRAMUSCULAR | Status: DC | PRN
  Administered 2019-09-04: 10 mg via INTRAVENOUS

## 2019-09-04 MED ORDER — FLUTICASONE FUROATE-VILANTEROL 100-25 MCG/INH IN AEPB
1.0000 | INHALATION_SPRAY | Freq: Every day | RESPIRATORY_TRACT | Status: DC
  Administered 2019-09-05 – 2019-09-08 (×4): 1 via RESPIRATORY_TRACT
  Filled 2019-09-04: qty 28

## 2019-09-04 MED ORDER — FLUDROCORTISONE ACETATE 0.1 MG PO TABS: 0.1000 mg | ORAL_TABLET | Freq: Every day | ORAL | Status: DC

## 2019-09-04 MED ORDER — NIFEDIPINE ER OSMOTIC RELEASE 90 MG PO TB24
90.0000 mg | ORAL_TABLET | Freq: Every day | ORAL | Status: DC
  Filled 2019-09-04 (×8): qty 1

## 2019-09-04 MED ORDER — NIFEDIPINE ER OSMOTIC RELEASE 30 MG PO TB24
90.0000 mg | ORAL_TABLET | Freq: Every day | ORAL | Status: DC
  Administered 2019-09-04 – 2019-09-08 (×4): 90 mg via ORAL
  Filled 2019-09-04 (×6): qty 3

## 2019-09-04 MED ORDER — ENSURE ENLIVE PO LIQD
237.0000 mL | ORAL | Status: DC
  Administered 2019-09-06 – 2019-09-07 (×2): 237 mL via ORAL

## 2019-09-04 MED ORDER — VALPROIC ACID 250 MG PO CAPS
500.0000 mg | ORAL_CAPSULE | Freq: Three times a day (TID) | ORAL | Status: DC
  Filled 2019-09-04 (×4): qty 2

## 2019-09-04 MED ORDER — FUROSEMIDE 10 MG/ML IJ SOLN
40.0000 mg | Freq: Two times a day (BID) | INTRAMUSCULAR | Status: DC
  Administered 2019-09-05: 40 mg via INTRAVENOUS
  Filled 2019-09-04: qty 4

## 2019-09-04 MED ORDER — FLUTICASONE PROPIONATE 50 MCG/ACT NA SUSP
2.0000 | Freq: Two times a day (BID) | NASAL | Status: DC
  Administered 2019-09-05 – 2019-09-08 (×7): 2 via NASAL
  Filled 2019-09-04: qty 16

## 2019-09-04 MED ORDER — SODIUM CHLORIDE 0.9 % IV SOLN
100.0000 mg | Freq: Every day | INTRAVENOUS | Status: AC
  Administered 2019-09-05 – 2019-09-08 (×4): 100 mg via INTRAVENOUS
  Filled 2019-09-04 (×4): qty 20
  Filled 2019-09-04: qty 100

## 2019-09-04 MED ORDER — NICARDIPINE HCL IN NACL 20-0.86 MG/200ML-% IV SOLN
3.0000 mg/h | INTRAVENOUS | Status: DC
  Administered 2019-09-04: 5 mg/h via INTRAVENOUS
  Filled 2019-09-04 (×4): qty 200

## 2019-09-04 NOTE — H&P (Signed)
[facility] [place] NAME: Natalie Thomas    MR#:  [id number]  DATE OF BIRTH:  1960/10/15  DATE OF ADMISSION:  09/04/2019  PRIMARY CARE PHYSICIAN: System, Pcp Not In   REQUESTING/REFERRING PHYSICIAN: Dr Joan Mayans  Patient coming from :[facility]  History of claim for more records and ER physician. Patient is lethargic she opens [place] to verbal commands however does not participate in meaningful conversation   CHIEF COMPLAINT:   Shortness of breath HISTORY OF PRESENT ILLNESS:  Natalie Thomas  is a 59 y.o. 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  Patient was reportedly tested positive for COVID past Tuesday. Requirement went out from 2 L to four later over this time.  Despite this EMS on arrival reported saturations in the 70s. She received some doorknobs came to the emergency room with acute hypoxic respiratory failure  ED course: patient currently is arrives able however with me unable to hold meaningful conversation. She did discuss with ER physician earlier. She reports ongoing cough and shortness of breath since Tuesday. Was tested positive for COVID chest x-ray shows bilateral pulmonary infiltrates with pulmonary edema COVID PCR-- negative here  Given her symptoms and positive test patient will be admitted with acute on chronic hypoxic respiratory failure secondary to cope with pneumonia and acute on chronic diastolic congestive heart failure  She received IV steroids, IV Lasix. Will start IV remedesivir PAST MEDICAL HISTORY:   Past Medical History:  Diagnosis Date  . Anemia 10/16/2017  . Anxiety and depression    . CHF (congestive heart failure) ([place])   . Chronic hyponatremia 04/08/2007   Qualifier: Diagnosis of  By: Marca Ancona RMA, Lucy    . Closed comminuted intertrochanteric fracture of right femur ([place]) 11/07/2017  . COPD 04/08/2007   Qualifier: Diagnosis of  By: Marca Ancona RMA, Lucy    . Depression   . Diabetes mellitus, type 2 ([place])    pt denies but states that she has been treated for DM  . Essential hypertension 04/08/2007   Qualifier: Diagnosis of  By: Marca Ancona RMA, Lucy    . GERD (gastroesophageal reflux disease)   . Hyperlipidemia 10/07/2017  . Lumbar disc disease 04/09/2011  . MENOPAUSE, PREMATURE 04/08/2007   Qualifier: Diagnosis of  By: Marca Ancona RMA, Lucy    . 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 Osteoporosis 04/08/2007   Qualifier: Diagnosis of  By: Reatha Armour, Lucy    . Polycythemia secondary to smoking 04/09/2011  . 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 Schizoaffective disorder, bipolar type ([place]) 07/05/2011    PAST SURGICAL HISTOIRY:   Past Surgical History:  Procedure Laterality Date  . BIOPSY  07/11/2019   Procedure: BIOPSY;  Surgeon: Jerene Bears, MD;  Location: [facility] ENDOSCOPY;  Service: Gastroenterology;;  . ESOPHAGOGASTRODUODENOSCOPY (EGD) WITH PROPOFOL N/A 07/11/2019   Procedure: ESOPHAGOGASTRODUODENOSCOPY (EGD) WITH PROPOFOL;  Surgeon: Jerene Bears, MD;  Location: [place];  Service: Gastroenterology;  Laterality: N/A;  . EYE SURGERY     on left  eye, pt states that she sees bad out of the right eye and needs surgery there  . INTRAMEDULLARY (IM) NAIL INTERTROCHANTERIC Right 11/13/2017   Procedure: INTRAMEDULLARY (IM) NAIL  INTERTROCHANTRIC;  Surgeon: Shona Needles, MD;  Location: [facility];  Service: Orthopedics;  Laterality: Right;  . PELVIC FRACTURE SURGERY      SOCIAL HISTORY:   Social History   Tobacco Use  . Smoking status: Current Every Day Smoker    Packs/day: 0.25    Types: Cigarettes  . Smokeless tobacco: Never  Used  Substance Use Topics  . Alcohol use: No    FAMILY HISTORY:  History reviewed. No pertinent family history.  DRUG ALLERGIES:   Allergies  Allergen Reactions  . Clarithromycin Itching  . Penicillins Itching    Has tolerated cefazolin before  Has patient had a PCN reaction causing immediate rash, facial/tongue/throat swelling, SOB or lightheadedness with hypotension: No Has patient had a PCN reaction causing severe rash involving mucus membranes or skin necrosis: No Has patient had a PCN reaction that required hospitalization: Unknown Has patient had a PCN reaction occurring within the last 10 years: No If all of the above answers are "NO", then may proceed with Cephalosporin use.     REVIEW OF SYSTEMS:  Review of Systems  Unable to perform ROS: Mental status change     MEDICATIONS AT HOME:   Prior to Admission medications   Medication Sig Start Date End Date Taking? Authorizing Provider  acetaminophen (TYLENOL) 500 MG tablet Take 1,000 mg by mouth every 8 (eight) hours as needed for mild pain or moderate pain. Not to exceed 3g/24h of tylenol from all sources.   Yes [provider]  albuterol (PROVENTIL) (2.5 MG/3ML) 0.083% nebulizer solution Take 3 mLs (2.5 mg total) by nebulization every 4 (four) hours as needed for wheezing or shortness of breath. 12/11/18  Yes Swayze, Ava, DO  albuterol (VENTOLIN HFA) 108 (90 Base) MCG/ACT inhaler Inhale 2 puffs into the lungs every 6 (six) hours as needed for wheezing or shortness of breath.   Yes [provider]  Alum & Mag Hydroxide-Simeth (ANTACID LIQUID PO) Take 30 mLs by mouth every 6 (six) hours as needed (heartburn).   Yes [provider]  buPROPion (WELLBUTRIN XL) 150 MG 24 hr tablet Take 150 mg by mouth daily.   Yes [provider]  Calcium Carb-Cholecalciferol (CALCIUM 600+D) 600-800 MG-UNIT TABS Take 1 tablet by mouth daily.   Yes [provider]  clonazePAM (KLONOPIN) 1 MG tablet  Take 1 tablet (1 mg total) by mouth 2 (two) times daily. 07/16/19  Yes Mercy Riding, MD  docusate sodium (COLACE) 100 MG capsule Take 100 mg by mouth 2 (two) times daily.    Yes [provider]  docusate sodium (COLACE) 100 MG capsule Take 100 mg by mouth 2 (two) times daily as needed for mild constipation.   Yes [provider]  fludrocortisone (FLORINEF) 0.1 MG tablet Take 1 tablet (0.1 mg total) by mouth daily. 07/17/19  Yes Mercy Riding, MD  fluticasone (FLONASE) 50 MCG/ACT nasal spray Place 2 sprays into both nostrils 2 (two) times daily.   Yes [provider]  fluticasone furoate-vilanterol (BREO ELLIPTA) 100-25 MCG/INH AEPB Inhale 1 puff into the lungs daily.   Yes [provider]  furosemide (LASIX) 20 MG tablet Take 20 mg by mouth daily.    Yes [provider]  guaiFENesin (MUCINEX) 600 MG 12 hr tablet Take 1,200 mg by mouth daily.   Yes [provider]  hydrOXYzine (ATARAX/VISTARIL) 25 MG tablet Take 25 mg by mouth every 6 (six) hours as needed for anxiety or itching.   Yes [provider]  ipratropium-albuterol (DUONEB) 0.5-2.5 (3) MG/3ML SOLN Take 3 mLs by nebulization 3 (three) times daily.   Yes [provider]  loperamide (IMODIUM) 2 MG capsule Take 2 mg by mouth every 6 (six) hours as needed for diarrhea or loose stools.  06/28/19  Yes [provider]  loratadine (CLARITIN) 10 MG tablet Take 10 mg by mouth daily.   Yes [provider]  LORazepam (ATIVAN) 1 MG tablet Take 1 mg by mouth every 8 (eight) hours as needed for anxiety (agitation).   Yes [provider]  lurasidone (LATUDA) 20 MG TABS tablet Take 1 tablet (20 mg total) by mouth 2 (two) times daily. 05/15/19  Yes Hongalgi, Lenis Dickinson, MD  Melatonin 5 MG TABS Take 5 mg by mouth at bedtime.   Yes [provider]  metoprolol tartrate (LOPRESSOR) 25 MG tablet Take 1 tablet (25 mg total) by mouth 2 (two) times daily. 12/17/18  Yes  Kayleen Memos, DO  Multiple Vitamin (MULTIVITAMIN WITH MINERALS) TABS tablet Take 1 tablet by mouth daily. 03/15/19  Yes Oretha Milch D, MD  neomycin-bacitracin-polymyxin (NEOSPORIN) ointment Apply 1 application topically as needed for wound care.   Yes [provider]  nicotine (NICODERM CQ - DOSED IN MG/24 HOURS) 21 mg/24hr patch Place 21 mg onto the skin daily.    Yes [provider]  NIFEdipine (PROCARDIA XL/ADALAT-CC) 90 MG 24 hr tablet Take 90 mg by mouth daily.   Yes [provider]  nystatin (NYSTATIN) powder Apply 1 application topically 2 (two) times daily as needed (irritation). Apply topically to reddened area twice daily as needed for irritation   Yes [provider]  OLANZapine (ZYPREXA) 10 MG tablet Take 10 mg by mouth 2 (two) times daily.   Yes [provider]  ondansetron (ZOFRAN) 8 MG tablet Take 8 mg by mouth every 6 (six) hours as needed for nausea or vomiting.   Yes [provider]  pantoprazole (PROTONIX) 40 MG tablet Take 1 tablet (40 mg total) by mouth daily. 07/16/19 01/12/20 Yes Mercy Riding, MD  polyethylene glycol (MIRALAX) 17 g packet Take 17 g by mouth daily. Patient taking differently: Take 17 g by mouth daily. Mix 1 capful (17 g) in 4-8 oz of water or juice and give by mouth once daily for bowel regimen 12/11/18  Yes Swayze, Ava, DO  sodium chloride (OCEAN) 0.65 % SOLN nasal spray Place 2 sprays into both nostrils every 2 (two) hours as needed (dryness, irritation).   Yes [provider]  sucralfate (CARAFATE) 1 GM/10ML suspension Take 10 mLs (1 g total) by mouth 4 (four) times daily -  with meals and at bedtime. 07/16/19  Yes Mercy Riding, MD  valproic acid (DEPAKENE) 250 MG capsule Take 2 capsules (500 mg total) by mouth 2 (two) times daily. Patient taking differently: Take 500 mg by mouth 3 (three) times daily.  12/11/18  Yes Swayze, Ava, DO  feeding supplement, ENSURE ENLIVE, (ENSURE ENLIVE) LIQD Take 237  mLs by mouth daily. 03/14/19   Oretha Milch D, MD      VITAL SIGNS:  Blood pressure (!) 197/64, pulse 75, temperature 97.8 F (36.6 C), temperature source Oral, resp. rate (!) 22, height 5' (1.524 m), weight 93 kg, SpO2 (!) 86 %.  PHYSICAL EXAMINATION:  GENERAL:  59 y.o.-year-old patient lying in the bed with mild respiratory acute distress. Morbidly obese EYES: Pupils equal, round, reactive to light and accommodation. No scleral icterus.  HEENT: Head atraumatic, normocephalic.  Oropharynx and nasopharynx clear.  NECK:  Supple, no jugular venous distention. No thyroid enlargement, no tenderness.  LUNGS: distant breath sounds bilaterally, no wheezing, rales,rhonchi or crepitation. No use of accessory muscles of respiration. On high flow nasal cannula oxygen CARDIOVASCULAR: S1, S2 normal. No murmurs, rubs, or gallops.  ABDOMEN: Soft, nontender, nondistended. Bowel sounds present. No organomegaly or mass.  EXTREMITIES: sonic bilateral 1+ pedal edema, -no cyanosis, or clubbing.  NEUROLOGIC: patient is visible. Unable to assess in detail. She knows her extremities well  PSYCHIATRIC: lethargic but arousal SKIN: No obvious rash, lesion, or ulcer. ---per RN  LABORATORY PANEL:   CBC Recent Labs  Lab 09/04/19 1214  WBC 7.2  HGB 12.5  HCT 41.1  PLT 127*   ------------------------------------------------------------------------------------------------------------------  Chemistries  Recent Labs  Lab 09/04/19 1214  NA 130*  K 4.1  CL 86*  CO2 39*  GLUCOSE 123*  BUN 14  CREATININE 0.45  CALCIUM 8.5*  AST 14*  ALT 9  ALKPHOS 54  BILITOT 0.2*   ------------------------------------------------------------------------------------------------------------------  Cardiac Enzymes No results for input(s): TROPONINI in the last 168 hours. ------------------------------------------------------------------------------------------------------------------  RADIOLOGY:  DG Chest Port 1  View  Result Date: 09/04/2019 CLINICAL DATA:  Shortness of breath and hypoxia. COVID positive 3 days ago. EXAM: PORTABLE CHEST 1 VIEW COMPARISON:  Radiograph 07/09/2019 FINDINGS: Cardiomegaly. Unchanged mediastinal contours with aortic atherosclerosis. Hazy opacities at both lung bases consistent with pleural effusions, new from prior exam. There is probable fluid in the fissures bilaterally. Patchy bilateral airspace opacities as well as interstitial thickening. Compression fractures in the thoracic spine which are chronic based on 05/14/2018 chest CT. IMPRESSION: 1. Cardiomegaly. Hazy opacities at the lung bases consistent with pleural effusions. 2. Bilateral patchy opacities as well as interstitial thickening. Suspect combination of pneumonia and pulmonary edema given patient is recently COVID positive. Aortic Atherosclerosis (ICD10-I70.0). Electronically Signed   By: Keith Rake M.D.   On: 09/04/2019 12:50    EKG:    IMPRESSION AND PLAN:   Natalie Thomas  is a 59 y.o. 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  Acute on chronic hypoxic/hypercapneic Respiratory failure secondary to COPD exacerbation in the setting of COVID pneumonia and congestive heart failure acute on chronic diastolic -patient presented with increasing shortness of breath with increased oxygen requirement. She is on baseline 2 L nasal count -she was decided to 70%. Currently on 7 L high flow nasal cannula -P CO2 is 97 -admit to step down ICU -d/w with ICU attending okay to use BiPAP in the setting of COVID -she was tested COVID positive past Tuesday at [place] PCR negative  here -IV Decadron -IV Remedesivir (1/5) -IV Lasix 40 mg BID -PRN nebulizer/inhalers  Acute on chronic diastolic heart failure -Lasix 40 mg BID, monitor input output -given BMI unable to do Reds vest  History of type II diabetes sugars are stable -will place on sliding scale insulin  Hypertension resume home meds  schizoaffective disorder since patient is very lethargic I'll hold off on her psych meds will resume once mental status improves  Chronic hyponatremia -- patient was recently admitted of [facility] in January.  -There was a concern raised about psychogenic polydipsia versus SI DH per nephrology she was started on florinef and  water restriction  chronic abdominal pain with history of abdominal hernia/ gastritis/Gerd -multiple CT scans rebuild ventral hernia without incarceration in the past -EEG done in January 2021 showed diffuse gastritis -continue PPI  Obesity, Morbid   Family Communication :pt has legal guardian Consults :PCCM Code Status :FULL DVT prophylaxis :lovenox  TOTAL critical TIME TAKING CARE OF THIS PATIENT: **50* minutes.    Fritzi Mandes M.D  [facility]  CC: Primary care physician; System, Pcp Not In

## 2019-09-04 NOTE — ED Triage Notes (Signed)
Pt to ER via EMS from [facility] with c/o [facility] and decreased O2 sats.  Pt tested pos for Covid on Tuesday and reports pneumonia.  Pt was given Duoneb and additional albuterol en route and has improved.

## 2019-09-04 NOTE — ED Provider Notes (Signed)
[facility] Emergency Department Provider Note  ____________________________________________   First MD Initiated Contact with Patient 09/04/19 1204     (approximate)  I have reviewed the triage vital signs and the nursing notes.  History  Chief Complaint Shortness of Breath    HPI Natalie Thomas is a 59 y.o. female with extensive past medical history as noted below, including COPD, HF, HTN, HLD, DM, schizoaffective disorder, chronic hypoxic respiratory failure on 2 L nasal cannula who presents from her assisted living facility for shortness of breath, increased oxygen requirements, reportedly tested positive at the living facility for COVID on Tuesday.  Patient reportedly tested positive for COVID on Tuesday via testing at her facility.  She reports that she has had to increase her nasal cannula from 2 L up to 4 L over this time.  Despite this increase, on EMS arrival they report her oxygen saturation was in the 70s.  They administered a DuoNeb plus additional albuterol with improvement to the low 90s on 4 L nasal cannula on arrival to the ED.  Patient reports ongoing cough and shortness of breath since Tuesday.  Associated with the chest tightness across her anterior chest.  The symptoms have been constant since onset, and progressively worsening.  She denies any vomiting or diarrhea.  Afebrile on arrival. No apparent alleviating or aggravating components.    Past Medical Hx Past Medical History:  Diagnosis Date  . Anemia 10/16/2017  . Anxiety and depression   . CHF (congestive heart failure) ([place])   . Chronic hyponatremia 04/08/2007   Qualifier: Diagnosis of  By: Marca Ancona RMA, Lucy    . Closed comminuted intertrochanteric fracture of right femur ([place]) 11/07/2017  . COPD 04/08/2007   Qualifier: Diagnosis of  By: Marca Ancona RMA, Lucy    . Depression   . Diabetes mellitus, type 2 ([place])    pt denies but states that she has been treated for DM  . Essential  hypertension 04/08/2007   Qualifier: Diagnosis of  By: Marca Ancona RMA, Lucy    . GERD (gastroesophageal reflux disease)   . Hyperlipidemia 10/07/2017  . Lumbar disc disease 04/09/2011  . MENOPAUSE, PREMATURE 04/08/2007   Qualifier: Diagnosis of  By: Marca Ancona RMA, Lucy    . 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 Osteoporosis 04/08/2007   Qualifier: Diagnosis of  By: Reatha Armour, Lucy    . Polycythemia secondary to smoking 04/09/2011  . 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 Schizoaffective disorder, bipolar type ([place]) 07/05/2011    Problem List Patient Active Problem List   Diagnosis Date Noted  . 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 Nausea   . Acute gastritis   . Abdominal pain, chronic, epigastric   . Hyponatremia 07/09/2019  . Acute diastolic CHF (congestive heart failure) ([place]) 07/09/2019  . Ventral hernia 07/09/2019  . Hypertensive crisis 05/01/2019  . Acute abdominal pain 03/15/2019  . Pleural effusion 03/13/2019  . Atelectasis 03/13/2019  . Chronic diastolic CHF (congestive heart failure) ([place]) 03/13/2019  . Type 2 diabetes mellitus without complication ([place]) [id number]  . Urinary tract bacterial infections 03/09/2019  . Acute cystitis without hematuria   . Constipation   . Acute hypoxemic respiratory failure ([place]) 12/03/2018  . Irritability 11/09/2018  . Aggressive behavior 11/09/2018  . Agitation   . S/P right hip fracture 11/07/2017  . Closed comminuted intertrochanteric fracture of right femur ([place]) 11/07/2017  . Anxiety and depression   . Dyspnea 10/16/2017  .  Anemia 10/16/2017  . COPD exacerbation ([place]) 10/07/2017  . Hyperlipidemia 10/07/2017  . Acute on chronic respiratory failure with hypoxia and hypercapnia ([place]) 10/07/2017  . Incontinence of urine 08/14/2011  . Schizoaffective disorder, bipolar type ([place]) 07/05/2011  . Cough 05/08/2011  . Lumbar disc disease 04/09/2011  . Polycythemia secondary to smoking  04/09/2011  . HIP PAIN, LEFT 12/12/2007  . 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 DM (diabetes mellitus), type 2 ([place]) 04/08/2007  . MENOPAUSE, PREMATURE 04/08/2007  . Chronic hyponatremia 04/08/2007  . Essential hypertension 04/08/2007  . COPD (chronic obstructive pulmonary disease) ([place]) 04/08/2007  . GERD 04/08/2007  . Osteoporosis 04/08/2007    Past Surgical Hx Past Surgical History:  Procedure Laterality Date  . BIOPSY  07/11/2019   Procedure: BIOPSY;  Surgeon: Jerene Bears, MD;  Location: [facility] ENDOSCOPY;  Service: Gastroenterology;;  . ESOPHAGOGASTRODUODENOSCOPY (EGD) WITH PROPOFOL N/A 07/11/2019   Procedure: ESOPHAGOGASTRODUODENOSCOPY (EGD) WITH PROPOFOL;  Surgeon: Jerene Bears, MD;  Location: [place];  Service: Gastroenterology;  Laterality: N/A;  . EYE SURGERY     on left  eye, pt states that she sees bad out of the right eye and needs surgery there  . INTRAMEDULLARY (IM) NAIL INTERTROCHANTERIC Right 11/13/2017   Procedure: INTRAMEDULLARY (IM) NAIL INTERTROCHANTRIC;  Surgeon: Shona Needles, MD;  Location: [place];  Service: Orthopedics;  Laterality: Right;  . PELVIC FRACTURE SURGERY      Medications Prior to Admission medications   Medication Sig Start Date End Date Taking? Authorizing Provider  acetaminophen (TYLENOL) 500 MG tablet Take 1,000 mg by mouth every 8 (eight) hours as needed for mild pain or moderate pain. Not to exceed 3g/24h of tylenol from all sources.    [provider]  albuterol (PROVENTIL) (2.5 MG/3ML) 0.083% nebulizer solution Take 3 mLs (2.5 mg total) by nebulization every 4 (four) hours as needed for wheezing or shortness of breath. 12/11/18   Swayze, Ava, DO  Alum & Mag Hydroxide-Simeth (ANTACID LIQUID PO) Take 30 mLs by mouth every 6 (six) hours as needed (heartburn).    [provider]  buPROPion (WELLBUTRIN XL) 150 MG 24 hr tablet Take 150 mg by mouth daily.    [provider]  Calcium Carb-Cholecalciferol (CALCIUM  600+D) 600-800 MG-UNIT TABS Take 1 tablet by mouth daily.    [provider]  clonazePAM (KLONOPIN) 1 MG tablet Take 1 tablet (1 mg total) by mouth 2 (two) times daily. 07/16/19   Mercy Riding, MD  docusate sodium (COLACE) 100 MG capsule Take 100 mg by mouth See admin instructions. Take one capsule (100 mg) by mouth every 12 hours, may also take one capsule (100 mg) twice daily as needed for constipation    [provider]  feeding supplement, ENSURE ENLIVE, (ENSURE ENLIVE) LIQD Take 237 mLs by mouth daily. 03/14/19   Desiree Hane, MD  fludrocortisone (FLORINEF) 0.1 MG tablet Take 1 tablet (0.1 mg total) by mouth daily. 07/17/19   Mercy Riding, MD  fluticasone (FLONASE) 50 MCG/ACT nasal spray Place 2 sprays into both nostrils 2 (two) times daily.    [provider]  fluticasone furoate-vilanterol (BREO ELLIPTA) 100-25 MCG/INH AEPB Inhale 1 puff into the lungs daily.    [provider]  furosemide (LASIX) 20 MG tablet Take 20 mg by mouth daily.     [provider]  guaiFENesin (MUCINEX) 600 MG 12 hr tablet Take 1,200 mg by mouth daily.    [provider]  hydrOXYzine (VISTARIL) 25 MG  capsule Take 1 capsule (25 mg total) by mouth every 6 (six) hours as needed for anxiety or itching. 07/16/19   Mercy Riding, MD  ipratropium-albuterol (DUONEB) 0.5-2.5 (3) MG/3ML SOLN Take 3 mLs by nebulization 3 (three) times daily.    [provider]  loperamide (IMODIUM A-D) 2 MG tablet Take 2 mg by mouth every 6 (six) hours as needed for diarrhea or loose stools. 06/28/19   [provider]  loratadine (CLARITIN) 10 MG tablet Take 10 mg by mouth daily.    [provider]  lurasidone (LATUDA) 20 MG TABS tablet Take 1 tablet (20 mg total) by mouth 2 (two) times daily. 05/15/19   Hongalgi, Lenis Dickinson, MD  Melatonin 5 MG TABS Take 5 mg by mouth at bedtime.    [provider]  metoprolol tartrate (LOPRESSOR) 25 MG tablet Take 1 tablet (25  mg total) by mouth 2 (two) times daily. 12/17/18   Kayleen Memos, DO  Multiple Vitamin (MULTIVITAMIN WITH MINERALS) TABS tablet Take 1 tablet by mouth daily. 03/15/19   Desiree Hane, MD  neomycin-bacitracin-polymyxin (NEOSPORIN) ointment Apply 1 application topically as needed for wound care.    [provider]  nicotine (NICODERM CQ - DOSED IN MG/24 HR) 7 mg/24hr patch Place 7 mg onto the skin daily.    [provider]  NIFEdipine (PROCARDIA XL/ADALAT-CC) 90 MG 24 hr tablet Take 90 mg by mouth daily.    [provider]  nystatin (NYSTATIN) powder Apply 1 application topically 2 (two) times daily as needed (irritation). Apply topically to reddened area twice daily as needed for irritation    [provider]  OLANZapine (ZYPREXA) 5 MG tablet Take 1 tablet (5 mg total) by mouth 2 (two) times daily. Patient taking differently: Take 5 mg by mouth daily as needed (agitation).  05/15/19   Hongalgi, Lenis Dickinson, MD  ondansetron (ZOFRAN) 8 MG tablet Take 8 mg by mouth every 6 (six) hours as needed for nausea or vomiting.    [provider]  pantoprazole (PROTONIX) 40 MG tablet Take 1 tablet (40 mg total) by mouth daily. 07/16/19 01/12/20  Mercy Riding, MD  polyethylene glycol (MIRALAX) 17 g packet Take 17 g by mouth daily. Patient taking differently: Take 17 g by mouth daily. Mix 1 capful (17 g) in 4-8 oz of water or juice and give by mouth once daily for bowel regimen 12/11/18   Swayze, Ava, DO  sodium chloride (OCEAN) 0.65 % SOLN nasal spray Place 2 sprays into both nostrils every 2 (two) hours as needed (dryness, irritation).    [provider]  sucralfate (CARAFATE) 1 GM/10ML suspension Take 10 mLs (1 g total) by mouth 4 (four) times daily -  with meals and at bedtime. 07/16/19   Mercy Riding, MD  valproic acid (DEPAKENE) 250 MG capsule Take 2 capsules (500 mg total) by mouth 2 (two) times daily. Patient taking differently: Take 500 mg by mouth 3 (three)  times daily.  12/11/18   Swayze, Ava, DO    Allergies Clarithromycin and Penicillins  Family Hx History reviewed. No pertinent family history.  Social Hx Social History   Tobacco Use  . Smoking status: Current Every Day Smoker    Packs/day: 0.25    Types: Cigarettes  . Smokeless tobacco: Never Used  Substance Use Topics  . Alcohol use: No  . Drug use: No     Review of Systems  Constitutional: Negative for fever, chills. Eyes: Negative for visual  changes. ENT: Negative for sore throat. Cardiovascular: + for chest pain. Respiratory: + for shortness of breath and cough. Gastrointestinal: Negative for nausea, vomiting.  Genitourinary: Negative for dysuria. Musculoskeletal: Negative for leg swelling. Skin: Negative for rash. Neurological: Negative for headaches.   Physical Exam  Vital Signs: ED Triage Vitals  Enc Vitals Group     BP 09/04/19 1155 (!) 195/68     Pulse Rate 09/04/19 1155 68     Resp 09/04/19 1155 18     Temp 09/04/19 1155 97.8 F (36.6 C)     Temp Source 09/04/19 1155 Oral     SpO2 09/04/19 1155 94 %     Weight 09/04/19 1158 205 lb 0.4 oz (93 kg)     Height 09/04/19 1158 5' (1.524 m)     Head Circumference --      Peak Flow --      Pain Score 09/04/19 1157 7     Pain Loc --      Pain Edu? --      Excl. in [place]? --     Constitutional: Alert and oriented.  Overweight.  Appears older than stated age.  Extremely difficult to understand. Head: Normocephalic. Atraumatic. Eyes: Conjunctivae clear, sclera anicteric. Pupils equal and symmetric. Nose: No masses or lesions. No congestion or rhinorrhea. Mouth/Throat: Wearing mask.  Neck: No stridor. Trachea midline.  Cardiovascular: Normal rate, regular rhythm. Extremities well perfused. Respiratory: Oxygen low to mid 90s on 4 L nasal cannula.  Coarse lung sounds bilaterally.  Appears somewhat winded with long sentences. Gastrointestinal: Soft. Non-distended. Non-tender.  Genitourinary:  Deferred. Musculoskeletal: Mild lower extremity edema. No deformities. Neurologic: Speech is difficult to understand, but seems this is her baseline. No gross focal or lateralizing neurologic deficits are appreciated.  Skin: Skin is warm, dry and intact. No rash noted. Psychiatric: Mood and affect are appropriate for situation.  EKG  Personally reviewed and interpreted by myself.   Rate: 66 Rhythm: sinus Axis: normal Intervals: WNL Isolated ST changes in V3, no other leads, no reciprocal changes No STEMI    Radiology  CXR:  IMPRESSION:  1. Cardiomegaly. Hazy opacities at the lung bases consistent with  pleural effusions.  2. Bilateral patchy opacities as well as interstitial thickening.  Suspect combination of pneumonia and pulmonary edema given patient  is recently COVID positive.     Procedures  Procedure(s) performed (including critical care):  .Critical Care Performed by: Lilia Pro., MD Authorized by: Lilia Pro., MD   Critical care provider statement:    Critical care time (minutes):  45   Critical care was necessary to treat or prevent imminent or life-threatening deterioration of the following conditions:  Respiratory failure   Critical care was time spent personally by me on the following activities:  Discussions with consultants, evaluation of patient's response to treatment, examination of patient, ordering and performing treatments and interventions, ordering and review of laboratory studies, ordering and review of radiographic studies, pulse oximetry, re-evaluation of patient's condition, obtaining history from patient or surrogate and review of old charts     Initial Impression / Assessment and Plan / MDM / ED Course  59 y.o. female who presents to the ED for shortness of breath, cough, and increased nasal cannula requirements in the setting of reported COVID positive testing on Tuesday.  Ddx: COVID, PNA, HF exacerbation  We will plan for  labs, including inflammatory markers, troponin, BNP, XR, EKG. Unfortunately, her living facility does not have physical results of her swab  available to [place], will therefore have to repeat testing here.  If/once confirmed, we will proceed with likely antiviral treatment.  Patient still with mild increased work of breathing and hypoxia to the upper 80s while on 4 L nasal cannula, will place on 8 L, high flow nasal cannula.  COVID PCR negative however still have a very high suspicion given her history and additionally as patient was not tolerant of thorough swab. Work up also notable for pulmonary edema on XR and elevated BNP, which is likely contributing to her presentation. Will give IV diuresis. Slightly elevated troponin likely 2/2 demand. Lactic WNL. Negative procalcitonin. Discussed w/ hospitalist for admission.   _______________________________  As part of my medical decision making I have reviewed available labs, radiology tests, reviewed old records.   Final Clinical Impression(s) / ED Diagnosis  Final diagnoses:  Hypoxia  SOB (shortness of breath)       Note:  This document was prepared using Dragon voice recognition software and may include unintentional dictation errors.   Lilia Pro., MD 09/04/19 1600

## 2019-09-04 NOTE — Consult Note (Signed)
Name: Natalie Thomas MRN: [id number] DOB: 1960-10-09    ADMISSION DATE:  09/04/2019 CONSULTATION DATE: 09/04/2019   REFERRING MD : Dr. Posey Pronto   CHIEF COMPLAINT: Shortness of Breath   BRIEF PATIENT DESCRIPTION:  59 yo female admitted with uncontrolled hypertension and acute hypoxic respiratory failure secondary to acute CHF exacerbation and COVID-19 pneumonia   SIGNIFICANT EVENTS/STUDIES:  03/12: Pt admitted to the stepdown unit PCCM team consulted   HISTORY OF PRESENT ILLNESS:   This is a 59 yo female with a PMH of Schizoaffective Disorder, Psychogenic Polydipsia, Polycythemia secondary to Tobacco Abuse, Malignant Neoplasm of the Vulva, HLD, GERD, Essential HTN, Type II Diabetes Mellitus, Depression, COPD, Right Femur Fracture, Anxiety/Depression, Anemia, and Chronic Diastolic CHF.  She presented to [facility] via EMS from [name] on 03/12 with c/o shortness of breath, cough, and hypoxia (increasing O2 requirements from 2L to 4L).  Per ER notes pt recently diagnosed (03/9) with COVID-19 pneumonia.  En route to the ER she receivd duoneb and albuterol treatments with improvement in respiratory failure.  In the ER Influenza PCR/COVID-19 negative, however CXR concerning for pneumonia and pulmonary edema.  Lab results revealed Na+ 130, chloride 86, gluocose 123, troponin 41, BNP 1,205, pct <0.10, lactic acid 0.8, and VBG pH 7.29/pCO2 70/bicarb 46.6.  Pt also hypertensive with sbp 190-200's.  She received iv decadron, 40 mg iv lasix, nitropaste, and remdesivir.  She was subsequently admitted to the stepdown unit per hospitalist team for additional workup and treatment PCCM team consulted to assist with treatment plan.    PAST MEDICAL HISTORY :   has a past medical history of Anemia (10/16/2017), Anxiety and depression, CHF (congestive heart failure) ([place]), Chronic hyponatremia (04/08/2007), Closed comminuted intertrochanteric fracture of right femur ([facility]) (11/07/2017), COPD (04/08/2007),  Depression, Diabetes mellitus, type 2 ([place]), Essential hypertension (04/08/2007), GERD (gastroesophageal reflux disease), Hyperlipidemia (10/07/2017), Lumbar disc disease (04/09/2011), MENOPAUSE, PREMATURE (04/08/2007), NEOPLASM, MALIGNANT, VULVA (04/08/2007), Osteoporosis (04/08/2007), Polycythemia secondary to smoking (04/09/2011), Psychogenic polydipsia (07/13/2019), and Schizoaffective disorder, bipolar type ([place]) (07/05/2011).  has a past surgical history that includes Pelvic fracture surgery; Eye surgery; Intramedullary (im) nail intertrochanteric (Right, 11/13/2017); Esophagogastroduodenoscopy (egd) with propofol (N/A, 07/11/2019); and biopsy (07/11/2019). Prior to Admission medications   Medication Sig Start Date End Date Taking? Authorizing Provider  acetaminophen (TYLENOL) 500 MG tablet Take 1,000 mg by mouth every 8 (eight) hours as needed for mild pain or moderate pain. Not to exceed 3g/24h of tylenol from all sources.   Yes [provider]  albuterol (PROVENTIL) (2.5 MG/3ML) 0.083% nebulizer solution Take 3 mLs (2.5 mg total) by nebulization every 4 (four) hours as needed for wheezing or shortness of breath. 12/11/18  Yes Swayze, Ava, DO  albuterol (VENTOLIN HFA) 108 (90 Base) MCG/ACT inhaler Inhale 2 puffs into the lungs every 6 (six) hours as needed for wheezing or shortness of breath.   Yes [provider]  Alum & Mag Hydroxide-Simeth (ANTACID LIQUID PO) Take 30 mLs by mouth every 6 (six) hours as needed (heartburn).   Yes [provider]  buPROPion (WELLBUTRIN XL) 150 MG 24 hr tablet Take 150 mg by mouth daily.   Yes [provider]  Calcium Carb-Cholecalciferol (CALCIUM 600+D) 600-800 MG-UNIT TABS Take 1 tablet by mouth daily.   Yes [provider]  clonazePAM (KLONOPIN) 1 MG tablet Take 1 tablet (1 mg total) by mouth 2 (two) times daily. 07/16/19  Yes Mercy Riding, MD  docusate sodium (COLACE) 100 MG capsule Take 100 mg by mouth 2 (  two) times daily.     Yes [provider]  docusate sodium (COLACE) 100 MG capsule Take 100 mg by mouth 2 (two) times daily as needed for mild constipation.   Yes [provider]  fludrocortisone (FLORINEF) 0.1 MG tablet Take 1 tablet (0.1 mg total) by mouth daily. 07/17/19  Yes Mercy Riding, MD  fluticasone (FLONASE) 50 MCG/ACT nasal spray Place 2 sprays into both nostrils 2 (two) times daily.   Yes [provider]  fluticasone furoate-vilanterol (BREO ELLIPTA) 100-25 MCG/INH AEPB Inhale 1 puff into the lungs daily.   Yes [provider]  furosemide (LASIX) 20 MG tablet Take 20 mg by mouth daily.    Yes [provider]  guaiFENesin (MUCINEX) 600 MG 12 hr tablet Take 1,200 mg by mouth daily.   Yes [provider]  hydrOXYzine (ATARAX/VISTARIL) 25 MG tablet Take 25 mg by mouth every 6 (six) hours as needed for anxiety or itching.   Yes [provider]  ipratropium-albuterol (DUONEB) 0.5-2.5 (3) MG/3ML SOLN Take 3 mLs by nebulization 3 (three) times daily.   Yes [provider]  loperamide (IMODIUM) 2 MG capsule Take 2 mg by mouth every 6 (six) hours as needed for diarrhea or loose stools.  06/28/19  Yes [provider]  loratadine (CLARITIN) 10 MG tablet Take 10 mg by mouth daily.   Yes [provider]  LORazepam (ATIVAN) 1 MG tablet Take 1 mg by mouth every 8 (eight) hours as needed for anxiety (agitation).   Yes [provider]  lurasidone (LATUDA) 20 MG TABS tablet Take 1 tablet (20 mg total) by mouth 2 (two) times daily. 05/15/19  Yes Hongalgi, Lenis Dickinson, MD  Melatonin 5 MG TABS Take 5 mg by mouth at bedtime.   Yes [provider]  metoprolol tartrate (LOPRESSOR) 25 MG tablet Take 1 tablet (25 mg total) by mouth 2 (two) times daily. 12/17/18  Yes Kayleen Memos, DO  Multiple Vitamin (MULTIVITAMIN WITH MINERALS) TABS tablet Take 1 tablet by mouth daily. 03/15/19  Yes Oretha Milch D, MD    neomycin-bacitracin-polymyxin (NEOSPORIN) ointment Apply 1 application topically as needed for wound care.   Yes [provider]  nicotine (NICODERM CQ - DOSED IN MG/24 HOURS) 21 mg/24hr patch Place 21 mg onto the skin daily.    Yes [provider]  NIFEdipine (PROCARDIA XL/ADALAT-CC) 90 MG 24 hr tablet Take 90 mg by mouth daily.   Yes [provider]  nystatin (NYSTATIN) powder Apply 1 application topically 2 (two) times daily as needed (irritation). Apply topically to reddened area twice daily as needed for irritation   Yes [provider]  OLANZapine (ZYPREXA) 10 MG tablet Take 10 mg by mouth 2 (two) times daily.   Yes [provider]  ondansetron (ZOFRAN) 8 MG tablet Take 8 mg by mouth every 6 (six) hours as needed for nausea or vomiting.   Yes [provider]  pantoprazole (PROTONIX) 40 MG tablet Take 1 tablet (40 mg total) by mouth daily. 07/16/19 01/12/20 Yes Mercy Riding, MD  polyethylene glycol (MIRALAX) 17 g packet Take 17 g by mouth daily. Patient taking differently: Take 17 g by mouth daily. Mix 1 capful (17 g) in 4-8 oz of water or juice and give by mouth once daily for bowel regimen 12/11/18  Yes Swayze, Ava, DO  sodium chloride (OCEAN) 0.65 % SOLN nasal spray Place 2 sprays into both nostrils every 2 (two) hours as needed (dryness, irritation).  Yes [provider]  sucralfate (CARAFATE) 1 GM/10ML suspension Take 10 mLs (1 g total) by mouth 4 (four) times daily -  with meals and at bedtime. 07/16/19  Yes Mercy Riding, MD  valproic acid (DEPAKENE) 250 MG capsule Take 2 capsules (500 mg total) by mouth 2 (two) times daily. Patient taking differently: Take 500 mg by mouth 3 (three) times daily.  12/11/18  Yes Swayze, Ava, DO  feeding supplement, ENSURE ENLIVE, (ENSURE ENLIVE) LIQD Take 237 mLs by mouth daily. 03/14/19   Desiree Hane, MD   Allergies  Allergen Reactions  . Clarithromycin Itching  . Penicillins Itching     Has tolerated cefazolin before  Has patient had a PCN reaction causing immediate rash, facial/tongue/throat swelling, SOB or lightheadedness with hypotension: No Has patient had a PCN reaction causing severe rash involving mucus membranes or skin necrosis: No Has patient had a PCN reaction that required hospitalization: Unknown Has patient had a PCN reaction occurring within the last 10 years: No If all of the above answers are "NO", then may proceed with Cephalosporin use.     FAMILY HISTORY:  family history is not on file. SOCIAL HISTORY:  reports that she has been smoking cigarettes. She has been smoking about 0.25 packs per day. She has never used smokeless tobacco. She reports that she does not drink alcohol or use drugs.  REVIEW OF SYSTEMS: Positives in BOLD  Constitutional: Negative for fever, chills, weight loss, malaise/fatigue and diaphoresis.  HENT: Negative for hearing loss, ear pain, nosebleeds, congestion, sore throat, neck pain, tinnitus and ear discharge.   Eyes: Negative for blurred vision, double vision, photophobia, pain, discharge and redness.  Respiratory: cough, hemoptysis, sputum production, shortness of breath, wheezing and stridor.   Cardiovascular: chest tightness, palpitations, orthopnea, claudication, leg swelling and PND.  Gastrointestinal: Negative for heartburn, nausea, vomiting, abdominal pain, diarrhea, constipation, blood in stool and melena.  Genitourinary: Negative for dysuria, urgency, frequency, hematuria and flank pain.  Musculoskeletal: Negative for myalgias, back pain, joint pain and falls.  Skin: Negative for itching and rash.  Neurological: Negative for dizziness, tingling, tremors, sensory change, speech change, focal weakness, seizures, loss of consciousness, weakness and headaches.  Endo/Heme/Allergies: Negative for environmental allergies and polydipsia. Does not bruise/bleed easily.  SUBJECTIVE:  No complaints at this time   VITAL  SIGNS: Temp:  [97.8 F (36.6 C)-98.7 F (37.1 C)] 98.7 F (37.1 C) (03/12 1800) Pulse Rate:  [68-88] 79 (03/12 1855) Resp:  [17-25] 17 (03/12 1855) BP: (179-238)/(54-78) 196/78 (03/12 1855) SpO2:  [86 %-97 %] 95 % (03/12 1800) Weight:  [93 kg] 93 kg (03/12 1158)  PHYSICAL EXAMINATION: General: well developed, well nourished female, NAD resting in bed Neuro: lethargic, follows commands, PERRLA HEENT: supple, no JVD  Cardiovascular: nsr, rrr, no R/G  Lungs: diminished throughout, even, non labored  Abdomen: +BS x4, obese, soft, non tender, non distended  Musculoskeletal: normal bulk and tone, no edema  Skin: intact no rashes or lesions   Recent Labs  Lab 09/04/19 1214  NA 130*  K 4.1  CL 86*  CO2 39*  BUN 14  CREATININE 0.45  GLUCOSE 123*   Recent Labs  Lab 09/04/19 1214  HGB 12.5  HCT 41.1  WBC 7.2  PLT 127*   DG Chest Port 1 View  Result Date: 09/04/2019 CLINICAL DATA:  Shortness of breath and hypoxia. COVID positive 3 days ago. EXAM: PORTABLE CHEST 1 VIEW COMPARISON:  Radiograph 07/09/2019 FINDINGS: Cardiomegaly. Unchanged mediastinal contours with aortic  atherosclerosis. Hazy opacities at both lung bases consistent with pleural effusions, new from prior exam. There is probable fluid in the fissures bilaterally. Patchy bilateral airspace opacities as well as interstitial thickening. Compression fractures in the thoracic spine which are chronic based on 05/14/2018 chest CT. IMPRESSION: 1. Cardiomegaly. Hazy opacities at the lung bases consistent with pleural effusions. 2. Bilateral patchy opacities as well as interstitial thickening. Suspect combination of pneumonia and pulmonary edema given patient is recently COVID positive. Aortic Atherosclerosis (ICD10-I70.0). Electronically Signed   By: Keith Rake M.D.   On: 09/04/2019 12:50    ASSESSMENT / PLAN:  Acute on chronic hypercapnic hypoxic respiratory failure secondary to COVID-19 pneumonia and acute CHF  exacerbation  Hx: COPD Supplemental O2 for dyspnea and/or hypoxia  Scheduled and prn bronchodilator therapy  Maintain airborne and contact precautions  Continue iv decadron Continue remdesivir, prn robitussin, and vitamins  Trend WBC, inflammatory markers, and monitor fever curve  Follow cultures   Acute on chronic CHF exacerbation (Echo 07/09/19: EF 60-65%) Uncontrolled hypertension Elevated troponin secondary to demand ischemia in the setting of acute respiratory failure  Hx: HLD Continuous telemetry monitoring  Continue metoprolol, po nifedipine, and iv lasix  Prn nicardipine gtt to maintain sbp <180  Type II Diabetes Mellitus  CBG's ac/hs  SSI   Schizoaffective Disorder, Bipolar Type  Continue bupropion, valproic acid, and haldol   Best Practice: SUP px: po protonix  VTE px: subq lovenox Diet: heart healthy   Marda Stalker, [place] Pager [phone] (please enter 7 digits) PCCM Consult Pager [phone] (please enter 7 digits)

## 2019-09-04 NOTE — Progress Notes (Signed)
Dr Duwayne Heck notified of patient Natalie Thomas. She will come up to evaluate patient. No additional orders.

## 2019-09-04 NOTE — Progress Notes (Signed)
Dr. Posey Pronto paged for BP 238/66. Verbal orders for Nitro paste two inches and to modify prn IV hydralazine order to q4h prn.

## 2019-09-04 NOTE — ED Notes (Signed)
Pt cleaned and linens and brief changed, external catheter placed.

## 2019-09-04 NOTE — Progress Notes (Signed)
Remdesivir - Pharmacy Brief Note   O:  ALT: 9  CXR: bilateral opacities  SpO2: 86 % on RA   A/P:  Remdesivir 200 mg IVPB once followed by 100 mg IVPB daily x 4 days.   Natalie Thomas D  09/04/2019 4:06 PM

## 2019-09-05 ENCOUNTER — Encounter: Payer: Self-pay | Admitting: Internal Medicine

## 2019-09-05 DIAGNOSIS — E871 Hypo-osmolality and hyponatremia: Secondary | ICD-10-CM

## 2019-09-05 DIAGNOSIS — F259 Schizoaffective disorder, unspecified: Secondary | ICD-10-CM

## 2019-09-05 LAB — CBC WITH DIFFERENTIAL/PLATELET
Abs Immature Granulocytes: 0.02 10*3/uL (ref 0.00–0.07)
Basophils Absolute: 0 10*3/uL (ref 0.0–0.1)
Basophils Relative: 0 %
Eosinophils Absolute: 0 10*3/uL (ref 0.0–0.5)
Eosinophils Relative: 0 %
HCT: 40.5 % (ref 36.0–46.0)
Hemoglobin: 12.5 g/dL (ref 12.0–15.0)
Immature Granulocytes: 0 %
Lymphocytes Relative: 18 %
Lymphs Abs: 1.1 10*3/uL (ref 0.7–4.0)
MCH: 29.1 pg (ref 26.0–34.0)
MCHC: 30.9 g/dL (ref 30.0–36.0)
MCV: 94.4 fL (ref 80.0–100.0)
Monocytes Absolute: 0.6 10*3/uL (ref 0.1–1.0)
Monocytes Relative: 9 %
Neutro Abs: 4.4 10*3/uL (ref 1.7–7.7)
Neutrophils Relative %: 73 %
Platelets: 146 10*3/uL — ABNORMAL LOW (ref 150–400)
RBC: 4.29 MIL/uL (ref 3.87–5.11)
RDW: 13.8 % (ref 11.5–15.5)
WBC: 6.1 10*3/uL (ref 4.0–10.5)
nRBC: 0 % (ref 0.0–0.2)

## 2019-09-05 LAB — COMPREHENSIVE METABOLIC PANEL
ALT: 8 U/L (ref 0–44)
AST: 10 U/L — ABNORMAL LOW (ref 15–41)
Albumin: 3 g/dL — ABNORMAL LOW (ref 3.5–5.0)
Alkaline Phosphatase: 49 U/L (ref 38–126)
Anion gap: 9 (ref 5–15)
BUN: 16 mg/dL (ref 6–20)
CO2: 36 mmol/L — ABNORMAL HIGH (ref 22–32)
Calcium: 8.5 mg/dL — ABNORMAL LOW (ref 8.9–10.3)
Chloride: 86 mmol/L — ABNORMAL LOW (ref 98–111)
Creatinine, Ser: 0.56 mg/dL (ref 0.44–1.00)
GFR calc Af Amer: >60 mL/min (ref >60)
GFR calc non Af Amer: >60 mL/min (ref >60)
Glucose, Bld: 122 mg/dL — ABNORMAL HIGH (ref 70–99)
Potassium: 3.4 mmol/L — ABNORMAL LOW (ref 3.5–5.1)
Sodium: 131 mmol/L — ABNORMAL LOW (ref 135–145)
Total Bilirubin: 0.4 mg/dL (ref 0.3–1.2)
Total Protein: 6 g/dL — ABNORMAL LOW (ref 6.5–8.1)

## 2019-09-05 LAB — GLUCOSE, CAPILLARY
Glucose-Capillary: 105 mg/dL — ABNORMAL HIGH (ref 70–99)
Glucose-Capillary: 68 mg/dL — ABNORMAL LOW (ref 70–99)
Glucose-Capillary: 101 mg/dL — ABNORMAL HIGH (ref 70–99)
Glucose-Capillary: 146 mg/dL — ABNORMAL HIGH (ref 70–99)
Glucose-Capillary: 175 mg/dL — ABNORMAL HIGH (ref 70–99)

## 2019-09-05 LAB — C-REACTIVE PROTEIN: CRP: 0.8 mg/dL (ref <1.0)

## 2019-09-05 MED ORDER — VALPROIC ACID 250 MG/5ML PO SOLN
500.0000 mg | Freq: Three times a day (TID) | ORAL | Status: DC
  Administered 2019-09-05 – 2019-09-08 (×10): 500 mg via ORAL
  Filled 2019-09-05 (×13): qty 10

## 2019-09-05 MED ORDER — MELATONIN 5 MG PO TABS
5.0000 mg | ORAL_TABLET | Freq: Every day | ORAL | Status: DC
  Administered 2019-09-05: 5 mg via ORAL
  Filled 2019-09-05 (×2): qty 1

## 2019-09-05 MED ORDER — CLONAZEPAM 1 MG PO TABS
1.0000 mg | ORAL_TABLET | Freq: Two times a day (BID) | ORAL | Status: DC
  Administered 2019-09-05 – 2019-09-08 (×7): 1 mg via ORAL
  Filled 2019-09-05 (×7): qty 1

## 2019-09-05 MED ORDER — IPRATROPIUM-ALBUTEROL 20-100 MCG/ACT IN AERS
1.0000 | INHALATION_SPRAY | RESPIRATORY_TRACT | Status: DC
  Administered 2019-09-05 – 2019-09-08 (×14): 1 via RESPIRATORY_TRACT
  Filled 2019-09-05: qty 4

## 2019-09-05 MED ORDER — ALBUTEROL SULFATE (2.5 MG/3ML) 0.083% IN NEBU: 2.5000 mg | INHALATION_SOLUTION | RESPIRATORY_TRACT | Status: DC | PRN

## 2019-09-05 MED ORDER — OLANZAPINE 10 MG PO TABS
10.0000 mg | ORAL_TABLET | Freq: Two times a day (BID) | ORAL | Status: DC
  Administered 2019-09-05 – 2019-09-08 (×7): 10 mg via ORAL
  Filled 2019-09-05 (×9): qty 1

## 2019-09-05 MED ORDER — LURASIDONE HCL 40 MG PO TABS
20.0000 mg | ORAL_TABLET | Freq: Two times a day (BID) | ORAL | Status: DC
  Administered 2019-09-05 – 2019-09-07 (×4): 20 mg via ORAL
  Administered 2019-09-07: 10 mg via ORAL
  Administered 2019-09-08: 09:00:00 20 mg via ORAL
  Filled 2019-09-05 (×9): qty 1

## 2019-09-05 MED ORDER — ACETAZOLAMIDE ER 500 MG PO CP12
500.0000 mg | ORAL_CAPSULE | Freq: Two times a day (BID) | ORAL | Status: AC
  Administered 2019-09-05 – 2019-09-07 (×4): 500 mg via ORAL
  Filled 2019-09-05 (×5): qty 1

## 2019-09-05 NOTE — Care Plan (Signed)
Please see consultation done by Marda Stalker, NP and my attestation.  Discussed the case with Dr. Posey Pronto today.  Continue management for COVID-19 and for acute decompensation of diastolic heart failure.  Patient is not tolerating BiPAP well however is tolerating high flow well.  Titrate O2 for O2 sats between 88 to 92%.  Bronchodilators have to be given as metered-dose inhalers due to COVID-19 diagnosis and avoidance of aerosols dispersion with nebulizers.   She has significant metabolic alkalosis, she will receive additional 4 doses of Diamox p.o. (500 mg every 12h)   C. Derrill Kay, MD [place] PCCM   *This note was dictated using voice recognition software/Dragon.  Despite best efforts to proofread, errors can occur which can change the meaning.  Any change was purely unintentional.

## 2019-09-05 NOTE — Progress Notes (Signed)
[facility] [place] NAME: Yakeline Grajewski    MR#:  [id number]  DATE OF BIRTH:  Jan 12, 1961  SUBJECTIVE:  patient is more awake alert. She wants nebulizer treatment. She wants her Klonopin. Eating well. Currently on high flow nasal cannula oxygen with 45% of fio2 and 40 L per minute REVIEW OF SYSTEMS:   Review of Systems  Constitutional: Negative for chills, fever and weight loss.  HENT: Negative for ear discharge, ear pain and nosebleeds.   Eyes: Negative for blurred vision, pain and discharge.  Respiratory: Positive for cough, shortness of breath and wheezing. Negative for sputum production and stridor.   Cardiovascular: Negative for chest pain, palpitations, orthopnea and PND.  Gastrointestinal: Negative for abdominal pain, diarrhea, nausea and vomiting.  Genitourinary: Negative for frequency and urgency.  Musculoskeletal: Positive for back pain. Negative for joint pain.  Neurological: Positive for weakness. Negative for sensory change, speech change and focal weakness.  Psychiatric/Behavioral: Negative for depression and hallucinations. The patient is not nervous/anxious.    Tolerating Diet:yes Tolerating PT: pending  DRUG ALLERGIES:   Allergies  Allergen Reactions  . Clarithromycin Itching  . Penicillins Itching    Has tolerated cefazolin before  Has patient had a PCN reaction causing immediate rash, facial/tongue/throat swelling, SOB or lightheadedness with hypotension: No Has patient had a PCN reaction causing severe rash involving mucus membranes or skin necrosis: No Has patient had a PCN reaction that required hospitalization: Unknown Has patient had a PCN reaction occurring within the last 10 years: No If all of the above answers are "NO", then may proceed with Cephalosporin use.     VITALS:  Blood pressure (!) 164/111, pulse 78, temperature 98 F (36.7 C), temperature source Oral, resp. rate (!) 22, height 5' (1.524 m),  weight 96.1 kg, SpO2 93 %.  PHYSICAL EXAMINATION:   Physical Exam  GENERAL:  59 y.o.-year-old patient lying in the bed with no acute distress. Morbid obesity EYES: Pupils equal, round, reactive to light and accommodation. No scleral icterus.   HEENT: Head atraumatic, normocephalic. Oropharynx and nasopharynx clear.  NECK:  Supple, no jugular venous distention. No thyroid enlargement, no tenderness.  LUNGS: distant breath sounds bilaterally, +wheezing, no rales  No use of accessory muscles of respiration.  CARDIOVASCULAR: S1, S2 normal. No murmurs, rubs, or gallops.  ABDOMEN: Soft, nontender, nondistended. Bowel sounds present. No organomegaly or mass.  EXTREMITIES: No cyanosis, clubbing or edema b/l.    NEUROLOGIC: Cranial nerves II through XII are intact. No focal Motor or sensory deficits b/l.   PSYCHIATRIC:  patient is alert and oriented x 2.  SKIN: No obvious rash, lesion, or ulcer.   LABORATORY PANEL:  CBC Recent Labs  Lab 09/05/19 0510  WBC 6.1  HGB 12.5  HCT 40.5  PLT 146*    Chemistries  Recent Labs  Lab 09/05/19 0510  NA 131*  K 3.4*  CL 86*  CO2 36*  GLUCOSE 122*  BUN 16  CREATININE 0.56  CALCIUM 8.5*  AST 10*  ALT 8  ALKPHOS 49  BILITOT 0.4   Cardiac Enzymes No results for input(s): TROPONINI in the last 168 hours. RADIOLOGY:  DG Chest Port 1 View  Result Date: 09/04/2019 CLINICAL DATA:  Shortness of breath and hypoxia. COVID positive 3 days ago. EXAM: PORTABLE CHEST 1 VIEW COMPARISON:  Radiograph 07/09/2019 FINDINGS: Cardiomegaly. Unchanged mediastinal contours with aortic atherosclerosis. Hazy opacities at both lung bases consistent with pleural effusions, new from prior exam. There is  probable fluid in the fissures bilaterally. Patchy bilateral airspace opacities as well as interstitial thickening. Compression fractures in the thoracic spine which are chronic based on 05/14/2018 chest CT. IMPRESSION: 1. Cardiomegaly. Hazy opacities at the lung bases  consistent with pleural effusions. 2. Bilateral patchy opacities as well as interstitial thickening. Suspect combination of pneumonia and pulmonary edema given patient is recently COVID positive. Aortic Atherosclerosis (ICD10-I70.0). Electronically Signed   By: Keith Rake M.D.   On: 09/04/2019 12:50   ASSESSMENT AND PLAN:   Inta Suski  is a 59 y.o. female with a known history of COPD on chronic 2 L nasal cannula oxygen, diastolic congestive heart failure chronic, hypertension, morbid obesity, hyperlipidemia, type II diabetes, schizoaffective disorder, chronic hyponatremia suspected psychogenic polydipsia comes to the emergency room from [place] with shortness of breath and increased oxygen requirement. Patient was reportedly tested positive for COVID past Tuesday (09/01/2019)  Ghada Blanche  is a 59 y.o. female with a known history of COPD on chronic 2 L nasal cannula oxygen, diastolic congestive heart failure chronic, hypertension, morbid obesity, hyperlipidemia, type II diabetes, schizoaffective disorder, chronic hyponatremia suspected psychogenic polydipsia comes to the emergency room from [place] with shortness of breath and increased oxygen requirement.  Acute on chronic hypoxic/hypercapneic Respiratory failure secondary to COPD exacerbation in the setting of COVID pneumonia and congestive heart failure acute on chronic diastolic -patient presented with increasing shortness of breath with increased oxygen requirement. She is on baseline 2 L nasal count -she was decided to 70%. Currently on 7 L high flow nasal cannula -P CO2 is 97 likely chronic--got diamox x1 -pt refused to wear BIPAP -she was tested COVID positive past Tuesday at [place] PCR negative here -IV Decadron -IV Remedesivir (2/5) -IV Lasix 40 mg BID -PRN nebulizer/inhalers -currently on 40 L/min with 45% Fio2  Acute on chronic diastolic heart failure -Lasix 40 mg BID,  monitor input output -given BMI unable to do Reds vest  History of type II diabetes sugars are stable -will place on sliding scale insulin  Hypertension resume home meds  schizoaffective disorder since patient's mentation has improved will resume her Klonopin, Zyprexa, melatonin, lutada  Chronic hyponatremia -- patient was recently admitted of [facility] in January.  -There was a concern raised about psychogenic polydipsia versus SI DH per nephrology she was started on florinef and water restriction  chronic abdominal pain with history of abdominal hernia/ gastritis/Gerd -multiple CT scans rebuild ventral hernia without incarceration in the past -EEG done in January 2021 showed diffuse gastritis -continue PPI  Obesity, Morbid   Family Communication :pt has legal guardian Consults :PCCM Code Status :FULL DVT prophylaxis :lovenox Discharge Disposition: to rehab [facility]) once oxygen requirement <5 liters/min  TOTAL TIME TAKING CARE OF THIS PATIENT: *35* minutes.  >50% time spent on counselling and coordination of care  Note: This dictation was prepared with Dragon dictation along with smaller phrase technology. Any transcriptional errors that result from this process are unintentional.  Fritzi Mandes M.D    [facility]   CC: Primary care physician; System, Pcp Not InPatient ID: RANEA RENEGAR, female   DOB: [birth year], 59 y.o.   MRN: [id number]

## 2019-09-06 LAB — COMPREHENSIVE METABOLIC PANEL
ALT: 7 U/L (ref 0–44)
AST: 12 U/L — ABNORMAL LOW (ref 15–41)
Albumin: 3 g/dL — ABNORMAL LOW (ref 3.5–5.0)
Alkaline Phosphatase: 52 U/L (ref 38–126)
Anion gap: 10 (ref 5–15)
BUN: 24 mg/dL — ABNORMAL HIGH (ref 6–20)
CO2: 32 mmol/L (ref 22–32)
Calcium: 8.4 mg/dL — ABNORMAL LOW (ref 8.9–10.3)
Chloride: 89 mmol/L — ABNORMAL LOW (ref 98–111)
Creatinine, Ser: 0.57 mg/dL (ref 0.44–1.00)
GFR calc Af Amer: >60 mL/min (ref >60)
GFR calc non Af Amer: >60 mL/min (ref >60)
Glucose, Bld: 144 mg/dL — ABNORMAL HIGH (ref 70–99)
Potassium: 3.5 mmol/L (ref 3.5–5.1)
Sodium: 131 mmol/L — ABNORMAL LOW (ref 135–145)
Total Bilirubin: 0.4 mg/dL (ref 0.3–1.2)
Total Protein: 6 g/dL — ABNORMAL LOW (ref 6.5–8.1)

## 2019-09-06 LAB — CBC WITH DIFFERENTIAL/PLATELET
Abs Immature Granulocytes: 0.02 10*3/uL (ref 0.00–0.07)
Basophils Absolute: 0 10*3/uL (ref 0.0–0.1)
Basophils Relative: 0 %
Eosinophils Absolute: 0 10*3/uL (ref 0.0–0.5)
Eosinophils Relative: 1 %
HCT: 37.8 % (ref 36.0–46.0)
Hemoglobin: 12.2 g/dL (ref 12.0–15.0)
Immature Granulocytes: 0 %
Lymphocytes Relative: 19 %
Lymphs Abs: 1.3 10*3/uL (ref 0.7–4.0)
MCH: 30 pg (ref 26.0–34.0)
MCHC: 32.3 g/dL (ref 30.0–36.0)
MCV: 92.9 fL (ref 80.0–100.0)
Monocytes Absolute: 0.7 10*3/uL (ref 0.1–1.0)
Monocytes Relative: 11 %
Neutro Abs: 4.5 10*3/uL (ref 1.7–7.7)
Neutrophils Relative %: 69 %
Platelets: 141 10*3/uL — ABNORMAL LOW (ref 150–400)
RBC: 4.07 MIL/uL (ref 3.87–5.11)
RDW: 13.6 % (ref 11.5–15.5)
WBC: 6.6 10*3/uL (ref 4.0–10.5)
nRBC: 0 % (ref 0.0–0.2)

## 2019-09-06 LAB — C-REACTIVE PROTEIN: CRP: 1 mg/dL — ABNORMAL HIGH (ref <1.0)

## 2019-09-06 LAB — GLUCOSE, CAPILLARY
Glucose-Capillary: 89 mg/dL (ref 70–99)
Glucose-Capillary: 133 mg/dL — ABNORMAL HIGH (ref 70–99)
Glucose-Capillary: 172 mg/dL — ABNORMAL HIGH (ref 70–99)
Glucose-Capillary: 173 mg/dL — ABNORMAL HIGH (ref 70–99)

## 2019-09-06 MED ORDER — FUROSEMIDE 10 MG/ML IJ SOLN
40.0000 mg | Freq: Two times a day (BID) | INTRAMUSCULAR | Status: DC
  Administered 2019-09-06 (×2): 40 mg via INTRAVENOUS
  Filled 2019-09-06 (×2): qty 4

## 2019-09-06 MED ORDER — FUROSEMIDE 10 MG/ML IJ SOLN: 40.0000 mg | Freq: Two times a day (BID) | INTRAMUSCULAR | Status: DC

## 2019-09-06 MED ORDER — ALBUTEROL SULFATE HFA 108 (90 BASE) MCG/ACT IN AERS
1.0000 | INHALATION_SPRAY | RESPIRATORY_TRACT | Status: DC | PRN
  Administered 2019-09-06 (×2): 2 via RESPIRATORY_TRACT
  Administered 2019-09-06: 1 via RESPIRATORY_TRACT
  Administered 2019-09-07: 2 via RESPIRATORY_TRACT
  Filled 2019-09-06: qty 6.7

## 2019-09-06 MED ORDER — FUROSEMIDE 10 MG/ML IJ SOLN: 20.0000 mg | Freq: Two times a day (BID) | INTRAMUSCULAR | Status: DC

## 2019-09-06 MED ORDER — MELATONIN 5 MG PO TABS
10.0000 mg | ORAL_TABLET | Freq: Every day | ORAL | Status: DC
  Administered 2019-09-06 – 2019-09-07 (×2): 10 mg via ORAL
  Filled 2019-09-06 (×3): qty 2

## 2019-09-06 NOTE — Progress Notes (Addendum)
[facility] [place] NAME: Natalie Thomas    MR#:  [id number]  DATE OF BIRTH:  1960/07/18  SUBJECTIVE:  patient is more awake alert. Eating well. Currently on high flow nasal cannula oxygen with 55% of fio2 and 40 L per minute sats 95-100% REVIEW OF SYSTEMS:   Review of Systems  Constitutional: Negative for chills, fever and weight loss.  HENT: Negative for ear discharge, ear pain and nosebleeds.   Eyes: Negative for blurred vision, pain and discharge.  Respiratory: Positive for cough and shortness of breath. Negative for sputum production and stridor.   Cardiovascular: Negative for chest pain, palpitations, orthopnea and PND.  Gastrointestinal: Negative for abdominal pain, diarrhea, nausea and vomiting.  Genitourinary: Negative for frequency and urgency.  Musculoskeletal: Negative for joint pain.  Neurological: Positive for weakness. Negative for sensory change, speech change and focal weakness.  Psychiatric/Behavioral: Negative for depression and hallucinations. The patient is not nervous/anxious.    Tolerating Diet:yes Tolerating PT: pending  DRUG ALLERGIES:   Allergies  Allergen Reactions  . Clarithromycin Itching  . Penicillins Itching    Has tolerated cefazolin before  Has patient had a PCN reaction causing immediate rash, facial/tongue/throat swelling, SOB or lightheadedness with hypotension: No Has patient had a PCN reaction causing severe rash involving mucus membranes or skin necrosis: No Has patient had a PCN reaction that required hospitalization: Unknown Has patient had a PCN reaction occurring within the last 10 years: No If all of the above answers are "NO", then may proceed with Cephalosporin use.     VITALS:  Blood pressure 111/79, pulse 64, temperature 98.3 F (36.8 C), temperature source Oral, resp. rate 15, height 5' (1.524 m), weight 97.1 kg, SpO2 96 %.  PHYSICAL EXAMINATION:   Physical Exam  GENERAL:   59 y.o.-year-old patient lying in the bed with no acute distress. Morbid obesity EYES: Pupils equal, round, reactive to light and accommodation. No scleral icterus.   HEENT: Head atraumatic, normocephalic. Oropharynx and nasopharynx clear.  NECK:  Supple, no jugular venous distention. No thyroid enlargement, no tenderness.  LUNGS: distant breath sounds bilaterally, +wheezing, no rales  No use of accessory muscles of respiration.  CARDIOVASCULAR: S1, S2 normal. No murmurs, rubs, or gallops.  ABDOMEN: Soft, nontender, nondistended. Bowel sounds present. No organomegaly or mass.  EXTREMITIES: No cyanosis, clubbing or edema b/l.    NEUROLOGIC: Cranial nerves II through XII are intact. No focal Motor or sensory deficits b/l.   PSYCHIATRIC:  patient is alert and oriented x 2.  SKIN: No obvious rash, lesion, or ulcer.   LABORATORY PANEL:  CBC Recent Labs  Lab 09/06/19 0452  WBC 6.6  HGB 12.2  HCT 37.8  PLT 141*    Chemistries  Recent Labs  Lab 09/06/19 0452  NA 131*  K 3.5  CL 89*  CO2 32  GLUCOSE 144*  BUN 24*  CREATININE 0.57  CALCIUM 8.4*  AST 12*  ALT 7  ALKPHOS 52  BILITOT 0.4   Cardiac Enzymes No results for input(s): TROPONINI in the last 168 hours. RADIOLOGY:  DG Chest Port 1 View  Result Date: 09/04/2019 CLINICAL DATA:  Shortness of breath and hypoxia. COVID positive 3 days ago. EXAM: PORTABLE CHEST 1 VIEW COMPARISON:  Radiograph 07/09/2019 FINDINGS: Cardiomegaly. Unchanged mediastinal contours with aortic atherosclerosis. Hazy opacities at both lung bases consistent with pleural effusions, new from prior exam. There is probable fluid in the fissures bilaterally. Patchy bilateral airspace opacities as well as  interstitial thickening. Compression fractures in the thoracic spine which are chronic based on 05/14/2018 chest CT. IMPRESSION: 1. Cardiomegaly. Hazy opacities at the lung bases consistent with pleural effusions. 2. Bilateral patchy opacities as well as  interstitial thickening. Suspect combination of pneumonia and pulmonary edema given patient is recently COVID positive. Aortic Atherosclerosis (ICD10-I70.0). Electronically Signed   By: Keith Rake M.D.   On: 09/04/2019 12:50   ASSESSMENT AND PLAN:   Natalie Thomas  is a 59 y.o. 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 Patient was reportedly tested positive for COVID past Tuesday (09/01/2019)  Natalie Thomas  is a 59 y.o. 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  Acute on chronic hypoxic/hypercapneic Respiratory failure secondary to COPD exacerbation in the setting of COVID pneumonia and congestive heart failure acute on chronic diastolic -patient presented with increasing shortness of breath with increased oxygen requirement. She is on baseline 2 L nasal count --P CO2 is 97 likely chronic--on diamox 500 mg bid  -pt refused to wear BIPAP -she was tested COVID positive past Tuesday at [facility] PCR negative here -IV Decadron -IV Remedesivir (3/5) -IV Lasix 40 mg BID--transition to lower dose tomorrow -PRN inhalers -currently on 40 L/min with 55% Fio2--d/w RT to wean her down to keep sats 88-92%  Acute on chronic diastolic heart failure -Lasix 40 mg BID, monitor input output -given BMI unable to do Reds vest -Good  UOP  History of type II diabetes sugars are stable -will place on sliding scale insulin  Hypertension resume home meds -BP soft--hold Metoprolol and nicardipine this am  schizoaffective disorder since patient's mentation has improved will resume her Klonopin, Zyprexa, melatonin, Latuda  Chronic hyponatremia -- patient was recently admitted of [facility] in January.  -There was a concern raised about psychogenic polydipsia versus SI DH per nephrology she was started on florinef and water restriction  chronic abdominal pain with history of abdominal hernia/ gastritis/Gerd -multiple CT scans rebuild ventral hernia without incarceration in the past -EGD done in January 2021 showed diffuse gastritis -continue PPI  Obesity, Morbid   Family Communication :pt has legal guardian--spoke with [name] today and updated Consults :PCCM Code Status :FULL DVT prophylaxis :lovenox Discharge Disposition: to rehab [facility]) once oxygen requirement <5 liters/min  TOTAL TIME TAKING CARE OF THIS PATIENT: *30* minutes.  >50% time spent on counselling and coordination of care  Note: This dictation was prepared with Dragon dictation along with smaller phrase technology. Any transcriptional errors that result from this process are unintentional.  Fritzi Mandes M.D    [facility]   CC: Primary care physician; System, Pcp Not InPatient ID: Natalie Thomas, female   DOB: 01-11-1961, 59 y.o.   MRN: [id number]

## 2019-09-06 NOTE — Care Plan (Signed)
Oxygen requirements greatly reduced she is now on bubble high flow at 8 L.  Very comfortable  Continue management for COVID-19 and for acute decompensation of diastolic heart failure.Titrate O2 for O2 sats between 88 to 92%.  She does have CO2 retention likely due to obesity with obesity hypoventilation and COPD.  Bronchodilators have to be given as metered-dose inhalers due to COVID-19 diagnosis and avoidance of aerosols dispersion with nebulizers.  She is to continue [employer] and Combivent.  Metabolic alkalosis is improving with Diamox.  She will receive last doses today.  From our standpoint can transfer to [place] floor.   Renold Don, MD [facility]   *This note was dictated using voice recognition software/Dragon.  Despite best efforts to proofread, errors can occur which can change the meaning.  Any change was purely unintentional.

## 2019-09-07 DIAGNOSIS — F250 Schizoaffective disorder, bipolar type: Secondary | ICD-10-CM

## 2019-09-07 DIAGNOSIS — E669 Obesity, unspecified: Secondary | ICD-10-CM

## 2019-09-07 DIAGNOSIS — Z6839 Body mass index (BMI) 39.0-39.9, adult: Secondary | ICD-10-CM

## 2019-09-07 LAB — CBC WITH DIFFERENTIAL/PLATELET
Abs Immature Granulocytes: 0.02 10*3/uL (ref 0.00–0.07)
Basophils Absolute: 0 10*3/uL (ref 0.0–0.1)
Basophils Relative: 0 %
Eosinophils Absolute: 0 10*3/uL (ref 0.0–0.5)
Eosinophils Relative: 0 %
HCT: 41.2 % (ref 36.0–46.0)
Hemoglobin: 12.9 g/dL (ref 12.0–15.0)
Immature Granulocytes: 0 %
Lymphocytes Relative: 17 %
Lymphs Abs: 1.1 10*3/uL (ref 0.7–4.0)
MCH: 29.3 pg (ref 26.0–34.0)
MCHC: 31.3 g/dL (ref 30.0–36.0)
MCV: 93.6 fL (ref 80.0–100.0)
Monocytes Absolute: 0.6 10*3/uL (ref 0.1–1.0)
Monocytes Relative: 10 %
Neutro Abs: 4.9 10*3/uL (ref 1.7–7.7)
Neutrophils Relative %: 73 %
Platelets: 142 10*3/uL — ABNORMAL LOW (ref 150–400)
RBC: 4.4 MIL/uL (ref 3.87–5.11)
RDW: 13.7 % (ref 11.5–15.5)
WBC: 6.8 10*3/uL (ref 4.0–10.5)
nRBC: 0 % (ref 0.0–0.2)

## 2019-09-07 LAB — COMPREHENSIVE METABOLIC PANEL
ALT: 10 U/L (ref 0–44)
AST: 12 U/L — ABNORMAL LOW (ref 15–41)
Albumin: 3.4 g/dL — ABNORMAL LOW (ref 3.5–5.0)
Alkaline Phosphatase: 53 U/L (ref 38–126)
Anion gap: 8 (ref 5–15)
BUN: 27 mg/dL — ABNORMAL HIGH (ref 6–20)
CO2: 33 mmol/L — ABNORMAL HIGH (ref 22–32)
Calcium: 8.7 mg/dL — ABNORMAL LOW (ref 8.9–10.3)
Chloride: 91 mmol/L — ABNORMAL LOW (ref 98–111)
Creatinine, Ser: 0.48 mg/dL (ref 0.44–1.00)
GFR calc Af Amer: >60 mL/min (ref >60)
GFR calc non Af Amer: >60 mL/min (ref >60)
Glucose, Bld: 111 mg/dL — ABNORMAL HIGH (ref 70–99)
Potassium: 4 mmol/L (ref 3.5–5.1)
Sodium: 132 mmol/L — ABNORMAL LOW (ref 135–145)
Total Bilirubin: 0.4 mg/dL (ref 0.3–1.2)
Total Protein: 6.9 g/dL (ref 6.5–8.1)

## 2019-09-07 LAB — GLUCOSE, CAPILLARY
Glucose-Capillary: 90 mg/dL (ref 70–99)
Glucose-Capillary: 124 mg/dL — ABNORMAL HIGH (ref 70–99)
Glucose-Capillary: 144 mg/dL — ABNORMAL HIGH (ref 70–99)
Glucose-Capillary: 168 mg/dL — ABNORMAL HIGH (ref 70–99)

## 2019-09-07 LAB — C-REACTIVE PROTEIN: CRP: 0.8 mg/dL (ref <1.0)

## 2019-09-07 MED ORDER — DEXAMETHASONE 4 MG PO TABS
6.0000 mg | ORAL_TABLET | Freq: Every day | ORAL | Status: DC
  Administered 2019-09-08: 6 mg via ORAL
  Filled 2019-09-07: qty 2

## 2019-09-07 MED ORDER — FUROSEMIDE 10 MG/ML IJ SOLN
20.0000 mg | Freq: Two times a day (BID) | INTRAMUSCULAR | Status: DC
  Administered 2019-09-07 (×2): 20 mg via INTRAVENOUS
  Filled 2019-09-07 (×2): qty 2

## 2019-09-07 NOTE — Progress Notes (Signed)
[facility] at [place] NAME: Natalie Thomas    MR#:  [id number]  DATE OF BIRTH:  [birth year]  SUBJECTIVE:  patient is more awake alert. Eating well. Currently on 4 L [place] and sats 94--97% REVIEW OF SYSTEMS:   Review of Systems  Constitutional: Negative for chills, fever and weight loss.  HENT: Negative for ear discharge, ear pain and nosebleeds.   Eyes: Negative for blurred vision, pain and discharge.  Respiratory: Positive for cough and shortness of breath. Negative for sputum production and stridor.   Cardiovascular: Negative for chest pain, palpitations, orthopnea and PND.  Gastrointestinal: Negative for abdominal pain, diarrhea, nausea and vomiting.  Genitourinary: Negative for frequency and urgency.  Musculoskeletal: Negative for joint pain.  Neurological: Positive for weakness. Negative for sensory change, speech change and focal weakness.  Psychiatric/Behavioral: Negative for depression and hallucinations. The patient is not nervous/anxious.    Tolerating Diet:yes Tolerating PT: rehab  DRUG ALLERGIES:   Allergies  Allergen Reactions  . Clarithromycin Itching  . Penicillins Itching    Has tolerated cefazolin before  Has patient had a PCN reaction causing immediate rash, facial/tongue/throat swelling, SOB or lightheadedness with hypotension: No Has patient had a PCN reaction causing severe rash involving mucus membranes or skin necrosis: No Has patient had a PCN reaction that required hospitalization: Unknown Has patient had a PCN reaction occurring within the last 10 years: No If all of the above answers are "NO", then may proceed with Cephalosporin use.     VITALS:  Blood pressure (!) 143/61, pulse 73, temperature 98.2 F (36.8 C), temperature source Axillary, resp. rate 16, height 5' (1.524 m), weight 97.1 kg, SpO2 97 %.  PHYSICAL EXAMINATION:   Physical Exam  GENERAL:  59 y.o.-year-old patient lying in the bed with no  acute distress. Morbid obesity EYES: Pupils equal, round, reactive to light and accommodation. No scleral icterus.   HEENT: Head atraumatic, normocephalic. Oropharynx and nasopharynx clear.  NECK:  Supple, no jugular venous distention. No thyroid enlargement, no tenderness.  LUNGS: distant breath sounds bilaterally, scattered +wheezing, no rales  No use of accessory muscles of respiration.  CARDIOVASCULAR: S1, S2 normal. No murmurs, rubs, or gallops.  ABDOMEN: Soft, nontender, nondistended. Bowel sounds present. No organomegaly or mass. Obese EXTREMITIES: No cyanosis, clubbing or edema b/l.    NEUROLOGIC: Cranial nerves II through XII are intact. No focal Motor or sensory deficits b/l.   PSYCHIATRIC:  patient is alert and oriented x 2.  SKIN: No obvious rash, lesion, or ulcer.   LABORATORY PANEL:  CBC Recent Labs  Lab 09/07/19 0609  WBC 6.8  HGB 12.9  HCT 41.2  PLT 142*    Chemistries  Recent Labs  Lab 09/07/19 0609  NA 132*  K 4.0  CL 91*  CO2 33*  GLUCOSE 111*  BUN 27*  CREATININE 0.48  CALCIUM 8.7*  AST 12*  ALT 10  ALKPHOS 53  BILITOT 0.4   Cardiac Enzymes No results for input(s): TROPONINI in the last 168 hours. RADIOLOGY:  No results found. ASSESSMENT AND PLAN:   Natalie Thomas  is a 59 y.o. 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 Patient was reportedly tested positive for COVID past Tuesday (09/01/2019)  Natalie Cruise  Thomas  is a 59 y.o. 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  Acute on chronic hypoxic/hypercapneic Respiratory failure secondary to COPD exacerbation in the setting of COVID pneumonia and congestive heart failure acute on chronic diastolic -patient presented with increasing shortness of breath with increased oxygen requirement. She is on baseline 2 L nasal count --P CO2 is 97 likely chronic--on diamox 500 mg bid (complted for 5 doses) -pt refused to wear BIPAP -she was tested COVID positive past Tuesday (09/01/2019) at [facility] PCR negative here -IV Decadron -IV Remedesivir (4/5) -IV Lasix 40 mg BID--transition to 20 mg bid today -PRN inhalers -currently on 4 L/min [place] and sats 94-97%  Acute on chronic diastolic heart failure -Lasix 40 mg BID, monitor input output -given BMI unable to do Reds vest -Good UOP (~5800 cc since admisison)  History of type II diabetes sugars are stable -will place on sliding scale insulin - not on any meds at home  Hypertension resume home meds Metoprolol and nicardipine  schizoaffective disorder since patient's mentation has improved will resume her Klonopin, Zyprexa, melatonin, Latuda  Chronic hyponatremia -- patient was recently admitted of [facility] in January.  -There was a concern raised about psychogenic polydipsia versus SIADH per nephrology she was started on florinef and water restriction  chronic abdominal pain with history of abdominal hernia/ gastritis/Gerd -multiple CT scans rebuild ventral hernia without incarceration in the past -EGD done in January 2021 showed diffuse gastritis -continue PPI  Obesity, Morbid   Family Communication :pt has legal guardian--spoke with Natalie Thomas on 3/15 and updated Consults :PCCM Code Status :FULL DVT prophylaxis :lovenox Discharge Disposition: to rehab  [facility]) tomorrow if she continues to require <5 liter [place] oxygen  TOTAL TIME TAKING CARE OF THIS PATIENT: *30* minutes.  >50% time spent on counselling and coordination of care  Note: This dictation was prepared with Dragon dictation along with smaller phrase technology. Any transcriptional errors that result from this process are unintentional.  Fritzi Mandes M.D    [facility]   CC: Primary care physician; System, Pcp Not InPatient ID: Natalie Thomas, female   DOB: 09-19-1960, 59 y.o.   MRN: [id number]

## 2019-09-07 NOTE — NC FL2 (Signed)
[place] LEVEL OF CARE SCREENING TOOL     IDENTIFICATION  Patient Name: Natalie Thomas Birthdate: [birth year] Sex: female Admission Date (Current Location): 09/04/2019  [place] and [place] Number:  Engineering geologist and Address:  [facility], [address], [place], [place] [address]      Provider Number: [id number]  Attending Physician Name and Address:  Fritzi Mandes, MD  Relative Name and Phone Number:  [name] - legal guardian [phone]    Current Level of Care: Hospital Recommended Level of Care: [place] Prior Approval Number:    Date Approved/Denied:   PASRR Number: [id number]  Discharge Plan: SNF    Current Diagnoses: Patient Active Problem List   Diagnosis Date Noted  . Acute on chronic respiratory failure with hypoxemia ([place]) 09/04/2019  . Pneumonia due to COVID-19 virus   . Acute on chronic diastolic CHF (congestive heart failure) ([place])   . 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 Nausea   . Acute gastritis   . Abdominal pain, chronic, epigastric   . Hyponatremia 07/09/2019  . Acute diastolic CHF (congestive heart failure) ([place]) 07/09/2019  . Ventral hernia 07/09/2019  . Hypertensive crisis 05/01/2019  . Acute abdominal pain 03/15/2019  . Pleural effusion 03/13/2019  . Atelectasis 03/13/2019  . Chronic diastolic CHF (congestive heart failure) ([place]) 03/13/2019  . Type 2 diabetes mellitus without complication ([place]) [id number]  . Urinary tract bacterial infections 03/09/2019  . Acute cystitis without hematuria   . Constipation   . Acute hypoxemic respiratory failure ([place]) 12/03/2018  . Irritability 11/09/2018  . Aggressive behavior 11/09/2018  . Agitation   . S/P right hip fracture 11/07/2017  . Closed comminuted intertrochanteric fracture of right femur ([place]) 11/07/2017  . Anxiety and depression   . Dyspnea 10/16/2017  . Anemia 10/16/2017  . COPD exacerbation ([place]) 10/07/2017  .  Hyperlipidemia 10/07/2017  . Acute on chronic respiratory failure with hypoxia and hypercapnia ([place]) 10/07/2017  . Incontinence of urine 08/14/2011  . Schizoaffective disorder, bipolar type ([place]) 07/05/2011  . Cough 05/08/2011  . Lumbar disc disease 04/09/2011  . Polycythemia secondary to smoking 04/09/2011  . HIP PAIN, LEFT 12/12/2007  . 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 DM (diabetes mellitus), type 2 ([place]) 04/08/2007  . MENOPAUSE, PREMATURE 04/08/2007  . Chronic hyponatremia 04/08/2007  . Schizoaffective disorder ([place]) 04/08/2007  . Essential hypertension 04/08/2007  . COPD (chronic obstructive pulmonary disease) ([place]) 04/08/2007  . GERD 04/08/2007  . Osteoporosis 04/08/2007    Orientation RESPIRATION BLADDER Height & Weight     Self, Time, Situation, Place  O2(4L [place]) Continent Weight: 97.1 kg Height:  5' (152.4 cm)  BEHAVIORAL SYMPTOMS/MOOD NEUROLOGICAL BOWEL NUTRITION STATUS      Continent Diet(Heart Healthy)  AMBULATORY STATUS COMMUNICATION OF NEEDS Skin   Extensive Assist Verbally Normal                       Personal Care Assistance Level of Assistance  Bathing, Feeding, Dressing Bathing Assistance: Limited assistance Feeding assistance: Independent Dressing Assistance: Limited assistance     Functional Limitations Info             SPECIAL CARE FACTORS FREQUENCY                       Contractures Contractures Info: Not present    Additional Factors Info  Code Status, Allergies Code Status Info: Full Allergies Info: Clarithromycin, PCN  Current Medications (09/07/2019):  This is the current hospital active medication list Current Facility-Administered Medications  Medication Dose Route Frequency Provider Last Rate Last Admin  . acetaZOLAMIDE (DIAMOX) 12 hr capsule 500 mg  500 mg Oral Q12H Tyler Pita, MD   500 mg at 09/06/19 2225  . albuterol (VENTOLIN HFA) 108 (90 Base) MCG/ACT inhaler 1-2 puff  1-2 puff Inhalation  Q4H PRN Fritzi Mandes, MD   1 puff at 09/06/19 2135  . ascorbic acid (VITAMIN C) tablet 500 mg  500 mg Oral Daily Fritzi Mandes, MD   500 mg at 09/06/19 1100  . buPROPion (WELLBUTRIN XL) 24 hr tablet 150 mg  150 mg Oral Daily Fritzi Mandes, MD   150 mg at 09/06/19 1100  . calcium-vitamin D (OSCAL WITH D) 500-200 MG-UNIT per tablet 1 tablet  1 tablet Oral Daily Fritzi Mandes, MD   1 tablet at 09/06/19 1100  . chlorhexidine (PERIDEX) 0.12 % solution 15 mL  15 mL Mouth Rinse BID Fritzi Mandes, MD   15 mL at 09/06/19 2137  . Chlorhexidine Gluconate Cloth 2 % PADS 6 each  6 each Topical Daily Fritzi Mandes, MD   6 each at 09/06/19 1000  . chlorpheniramine-HYDROcodone (TUSSIONEX) 10-8 MG/5ML suspension 5 mL  5 mL Oral Q12H PRN Fritzi Mandes, MD   5 mL at 09/05/19 2339  . clonazePAM (KLONOPIN) tablet 1 mg  1 mg Oral BID Fritzi Mandes, MD   1 mg at 09/06/19 2136  . dexamethasone (DECADRON) injection 6 mg  6 mg Intravenous Q24H Fritzi Mandes, MD   6 mg at 09/06/19 1100  . docusate sodium (COLACE) capsule 100 mg  100 mg Oral BID Fritzi Mandes, MD   100 mg at 09/06/19 2136  . enoxaparin (LOVENOX) injection 40 mg  40 mg Subcutaneous Q12H Fritzi Mandes, MD   40 mg at 09/06/19 2135  . feeding supplement (ENSURE ENLIVE) (ENSURE ENLIVE) liquid 237 mL  237 mL Oral Q24H Fritzi Mandes, MD   237 mL at 09/06/19 1728  . fluticasone (FLONASE) 50 MCG/ACT nasal spray 2 spray  2 spray Each Nare BID Fritzi Mandes, MD   2 spray at 09/06/19 2136  . fluticasone furoate-vilanterol (BREO ELLIPTA) 100-25 MCG/INH 1 puff  1 puff Inhalation Daily Fritzi Mandes, MD   1 puff at 09/06/19 1100  . furosemide (LASIX) injection 20 mg  20 mg Intravenous BID Fritzi Mandes, MD      . guaiFENesin (MUCINEX) 12 hr tablet 1,200 mg  1,200 mg Oral Daily Fritzi Mandes, MD   1,200 mg at 09/06/19 1100  . guaiFENesin-dextromethorphan (ROBITUSSIN DM) 100-10 MG/5ML syrup 10 mL  10 mL Oral Q4H PRN Fritzi Mandes, MD      . hydrALAZINE (APRESOLINE) injection 10-20 mg  10-20 mg Intravenous Q4H  PRN Awilda Bill, NP   10 mg at 09/04/19 2024  . insulin aspart (novoLOG) injection 0-15 Units  0-15 Units Subcutaneous TID WC Awilda Bill, NP   3 Units at 09/06/19 1716  . insulin aspart (novoLOG) injection 0-5 Units  0-5 Units Subcutaneous QHS Awilda Bill, NP      . Ipratropium-Albuterol (COMBIVENT) respimat 1 puff  1 puff Inhalation Q4H Tyler Pita, MD   1 puff at 09/06/19 2135  . lurasidone (LATUDA) tablet 20 mg  20 mg Oral BID Fritzi Mandes, MD   20 mg at 09/06/19 2225  . MEDLINE mouth rinse  15 mL Mouth Rinse q12n4p Fritzi Mandes, MD   15 mL at 09/06/19 1700  .  Melatonin TABS 10 mg  10 mg Oral QHS Tyler Pita, MD   10 mg at 09/06/19 2137  . metoprolol tartrate (LOPRESSOR) tablet 25 mg  25 mg Oral BID Fritzi Mandes, MD   Stopped at 09/05/19 2047  . multivitamin with minerals tablet 1 tablet  1 tablet Oral Daily Fritzi Mandes, MD   1 tablet at 09/06/19 1100  . NIFEdipine (PROCARDIA-XL/NIFEDICAL-XL) 24 hr tablet 90 mg  90 mg Oral Daily Fritzi Mandes, MD   90 mg at 09/06/19 1100  . nystatin (MYCOSTATIN/NYSTOP) topical powder 1 application  1 application Topical BID PRN Fritzi Mandes, MD      . OLANZapine [facility]) tablet 10 mg  10 mg Oral BID Fritzi Mandes, MD   10 mg at 09/06/19 2224  . ondansetron (ZOFRAN) tablet 4 mg  4 mg Oral Q6H PRN Fritzi Mandes, MD       Or  . ondansetron [facility]) injection 4 mg  4 mg Intravenous Q6H PRN Fritzi Mandes, MD      . pantoprazole (PROTONIX) EC tablet 40 mg  40 mg Oral Daily Fritzi Mandes, MD   40 mg at 09/06/19 1100  . polyethylene glycol (MIRALAX / GLYCOLAX) packet 17 g  17 g Oral Daily Fritzi Mandes, MD   17 g at 09/06/19 1100  . remdesivir 100 mg in sodium chloride 0.9 % 100 mL IVPB  100 mg Intravenous Daily Lilia Pro., MD 200 mL/hr at 09/06/19 1042 100 mg at 09/06/19 1042  . sucralfate (CARAFATE) 1 GM/10ML suspension 1 g  1 g Oral TID WC & HS Fritzi Mandes, MD   1 g at 09/06/19 2139  . valproic acid (DEPAKENE) 250 MG/5ML solution 500 mg  500 mg  Oral TID Fritzi Mandes, MD   500 mg at 09/06/19 2223  . zinc sulfate capsule 220 mg  220 mg Oral Daily Fritzi Mandes, MD   220 mg at 09/06/19 1100     Discharge Medications: Please see discharge summary for a list of discharge medications.  Relevant Imaging Results:  Relevant Lab Results:   Additional Information SS# [id number]  Shelbie Hutching, RN

## 2019-09-07 NOTE — Progress Notes (Signed)
Pt given scheduled dose of depakote syrup and began coughing very forcefully. At that time patient was on 4 liters high flow nasal cannula, her nose began bleeding. Patient placed on non rebreather and head placed forward and pt encouraged to blow nose into tissue with non rebreather held to face. Nasal passages cleared of blood and pt kept on non rebreather for approximately 15 minutes until no longer coughing or bleeding. Provider notified via secure text, no new orders received. Patient transitioned back to high flow nasal cannula at 4 liters.and is currently sitting up in bed eating meal.

## 2019-09-07 NOTE — TOC Initial Note (Signed)
Transition of Care [facility]) - Initial/Assessment Note    Patient Details  Name: Natalie Thomas MRN: [id number] Date of Birth: 09-06-1960  Transition of Care [facility]) CM/SW Contact:    Shelbie Hutching, RN Phone Number: 09/07/2019, 2:22 PM  Clinical Narrative:                 Patient admitted with acute on chronic respiratory failure, COVID positive.  Patient is from [facility] for long term care and patient has a legal guardian.  Plan will be for patient to return to [name] at discharge.    Expected Discharge Plan: Skilled Nursing Facility Barriers to Discharge: Continued Medical Work up   Patient Goals and CMS Choice        Expected Discharge Plan and Services Expected Discharge Plan: [place]   Discharge Planning Services: CM Consult Post Acute Care Choice: Resumption of Svcs/PTA Provider, [place] Living arrangements for the past 2 months: [place]                                      Prior Living Arrangements/Services Living arrangements for the past 2 months: [place] Lives with:: Facility Resident Patient language and need for interpreter reviewed:: Yes        Need for Family Participation in Patient Care: No (Comment) Care giver support system in place?: No (comment)   Criminal Activity/Legal Involvement Pertinent to Current Situation/Hospitalization: No - Comment as needed  Activities of Daily Living Home Assistive Devices/Equipment: None ADL Screening (condition at time of admission) Patient's cognitive ability adequate to safely complete daily activities?: Yes Is the patient deaf or have difficulty hearing?: No Does the patient have difficulty seeing, even when wearing glasses/contacts?: No Does the patient have difficulty concentrating, remembering, or making decisions?: No Patient able to express need for assistance with ADLs?: Yes Does the patient have  difficulty dressing or bathing?: Yes Independently performs ADLs?: No Communication: Independent Dressing (OT): Needs assistance Is this a change from baseline?: Pre-admission baseline Grooming: Needs assistance Is this a change from baseline?: Pre-admission baseline Feeding: Independent Bathing: Needs assistance Is this a change from baseline?: Pre-admission baseline Toileting: Needs assistance Is this a change from baseline?: Pre-admission baseline In/Out Bed: Needs assistance Is this a change from baseline?: Pre-admission baseline Walks in Home: Needs assistance Is this a change from baseline?: Pre-admission baseline Does the patient have difficulty walking or climbing stairs?: Yes Weakness of Legs: Both Weakness of Arms/Hands: Both  Permission Sought/Granted Permission sought to share information with : Facility Sport and exercise psychologist, Case Optician, dispensing granted to share information with : Yes, Verbal Permission Granted     Permission granted to share info w AGENCY: Investment banker, operational granted to share info w Relationship: Legal Guardian     Emotional Assessment       Orientation: : Oriented to Self, Oriented to Place, Oriented to  Time, Oriented to Situation Alcohol / Substance Use: Not Applicable Psych Involvement: No (comment)  Admission diagnosis:  SOB (shortness of breath) [R06.02] Hypoxia [R09.02] Acute on chronic respiratory failure with hypoxemia ([place]) [J96.21] Patient Active Problem List   Diagnosis Date Noted  . Obesity, Class III, BMI 40-49.9 (morbid obesity) ([place])   . Acute on chronic respiratory failure with hypoxemia ([place]) 09/04/2019  . COVID-19 virus infection   . Acute on chronic diastolic CHF (congestive heart failure) ([place])   .  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 Nausea   . Acute gastritis   . Abdominal pain, chronic, epigastric   . Hyponatremia 07/09/2019  . Acute diastolic CHF (congestive heart failure) ([name]) 07/09/2019  .  Ventral hernia 07/09/2019  . Hypertensive crisis 05/01/2019  . Acute abdominal pain 03/15/2019  . Pleural effusion 03/13/2019  . Atelectasis 03/13/2019  . Chronic diastolic CHF (congestive heart failure) ([place]) 03/13/2019  . Type 2 diabetes mellitus without complication ([place]) [id number]  . Urinary tract bacterial infections 03/09/2019  . Acute cystitis without hematuria   . Constipation   . Acute hypoxemic respiratory failure ([place]) 12/03/2018  . Irritability 11/09/2018  . Aggressive behavior 11/09/2018  . Agitation   . S/P right hip fracture 11/07/2017  . Closed comminuted intertrochanteric fracture of right femur ([place]) 11/07/2017  . Anxiety and depression   . Dyspnea 10/16/2017  . Anemia 10/16/2017  . COPD exacerbation ([place]) 10/07/2017  . Hyperlipidemia 10/07/2017  . Acute on chronic respiratory failure with hypoxia and hypercapnia ([place]) 10/07/2017  . Incontinence of urine 08/14/2011  . Schizoaffective disorder, bipolar type ([place]) 07/05/2011  . Cough 05/08/2011  . Lumbar disc disease 04/09/2011  . Polycythemia secondary to smoking 04/09/2011  . HIP PAIN, LEFT 12/12/2007  . 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 DM (diabetes mellitus), type 2 ([place]) 04/08/2007  . MENOPAUSE, PREMATURE 04/08/2007  . Chronic hyponatremia 04/08/2007  . Schizoaffective disorder ([place]) 04/08/2007  . Essential hypertension 04/08/2007  . COPD (chronic obstructive pulmonary disease) ([place]) 04/08/2007  . GERD 04/08/2007  . Osteoporosis 04/08/2007   PCP:  System, Pcp Not In Pharmacy:   [place], [address] [place] [address] Phone: [phone] Fax: [phone]     Social Determinants of Health ([place]) Interventions    Readmission Risk Interventions Readmission Risk Prevention Plan 07/10/2019 07/01/2019 03/10/2019  Transportation Screening Complete Complete Complete  Medication Review (RN Care Manager) Referral to Pharmacy Referral to Pharmacy  Complete  PCP or Specialist appointment within 3-5 days of discharge Complete Complete Complete  HRI or [facility] Not Complete Not Complete Complete  [place] or Home Care Consult Pt Refusal Comments pt is resident of [facility] No orders yet, patient is from [place] Care/Counseling Consult Complete Complete Complete  Palliative Care Screening Not Applicable Not Applicable Not [place] Not Applicable Not Applicable Complete  Some recent data might be hidden

## 2019-09-07 NOTE — Care Management Important Message (Signed)
Important Message  Patient Details  Name: PAVANI PENNOYER MRN: [id number] Date of Birth: [birth year]   Medicare Important Message Given:  Yes     Shelbie Hutching, RN 09/07/2019, 3:40 PM

## 2019-09-08 LAB — CBC WITH DIFFERENTIAL/PLATELET
Abs Immature Granulocytes: 0.03 10*3/uL (ref 0.00–0.07)
Basophils Absolute: 0 10*3/uL (ref 0.0–0.1)
Basophils Relative: 0 %
Eosinophils Absolute: 0 10*3/uL (ref 0.0–0.5)
Eosinophils Relative: 0 %
HCT: 39.2 % (ref 36.0–46.0)
Hemoglobin: 12.6 g/dL (ref 12.0–15.0)
Immature Granulocytes: 1 %
Lymphocytes Relative: 24 %
Lymphs Abs: 1.4 10*3/uL (ref 0.7–4.0)
MCH: 30 pg (ref 26.0–34.0)
MCHC: 32.1 g/dL (ref 30.0–36.0)
MCV: 93.3 fL (ref 80.0–100.0)
Monocytes Absolute: 0.5 10*3/uL (ref 0.1–1.0)
Monocytes Relative: 9 %
Neutro Abs: 3.9 10*3/uL (ref 1.7–7.7)
Neutrophils Relative %: 66 %
Platelets: 145 10*3/uL — ABNORMAL LOW (ref 150–400)
RBC: 4.2 MIL/uL (ref 3.87–5.11)
RDW: 13.6 % (ref 11.5–15.5)
WBC: 5.9 10*3/uL (ref 4.0–10.5)
nRBC: 0 % (ref 0.0–0.2)

## 2019-09-08 LAB — COMPREHENSIVE METABOLIC PANEL
ALT: 21 U/L (ref 0–44)
AST: 19 U/L (ref 15–41)
Albumin: 3.1 g/dL — ABNORMAL LOW (ref 3.5–5.0)
Alkaline Phosphatase: 54 U/L (ref 38–126)
Anion gap: 9 (ref 5–15)
BUN: 29 mg/dL — ABNORMAL HIGH (ref 6–20)
CO2: 31 mmol/L (ref 22–32)
Calcium: 8.6 mg/dL — ABNORMAL LOW (ref 8.9–10.3)
Chloride: 93 mmol/L — ABNORMAL LOW (ref 98–111)
Creatinine, Ser: 0.49 mg/dL (ref 0.44–1.00)
GFR calc Af Amer: >60 mL/min (ref >60)
GFR calc non Af Amer: >60 mL/min (ref >60)
Glucose, Bld: 147 mg/dL — ABNORMAL HIGH (ref 70–99)
Potassium: 4.1 mmol/L (ref 3.5–5.1)
Sodium: 133 mmol/L — ABNORMAL LOW (ref 135–145)
Total Bilirubin: 0.5 mg/dL (ref 0.3–1.2)
Total Protein: 6.5 g/dL (ref 6.5–8.1)

## 2019-09-08 LAB — C-REACTIVE PROTEIN: CRP: 1 mg/dL — ABNORMAL HIGH (ref <1.0)

## 2019-09-08 LAB — GLUCOSE, CAPILLARY
Glucose-Capillary: 153 mg/dL — ABNORMAL HIGH (ref 70–99)
Glucose-Capillary: 149 mg/dL — ABNORMAL HIGH (ref 70–99)
Glucose-Capillary: 106 mg/dL — ABNORMAL HIGH (ref 70–99)

## 2019-09-08 MED ORDER — FUROSEMIDE 20 MG PO TABS
20.0000 mg | ORAL_TABLET | Freq: Every day | ORAL | Status: DC
  Administered 2019-09-08: 20 mg via ORAL
  Filled 2019-09-08: qty 1

## 2019-09-08 MED ORDER — DEXAMETHASONE 6 MG PO TABS: 6.0000 mg | ORAL_TABLET | Freq: Every day | ORAL | 0 refills | Status: DC

## 2019-09-08 MED ORDER — VALPROIC ACID 250 MG PO CAPS: 500.0000 mg | ORAL_CAPSULE | Freq: Three times a day (TID) | ORAL | 0 refills | Status: DC

## 2019-09-08 MED ORDER — FLEET ENEMA 7-19 GM/118ML RE ENEM: 1.0000 | ENEMA | Freq: Once | RECTAL | Status: DC

## 2019-09-08 NOTE — Discharge Instructions (Signed)
Pt will f/u up MD's at [facility]

## 2019-09-08 NOTE — Discharge Summary (Signed)
[facility] [place] NAME: Natalie Thomas    MR#:  [id number]  DATE OF BIRTH:  August 16, 1960  DATE OF ADMISSION:  09/04/2019 ADMITTING PHYSICIAN: Natalie Mandes, MD  DATE OF DISCHARGE: 09/08/2019  PRIMARY CARE PHYSICIAN: System, Pcp Not In    ADMISSION DIAGNOSIS:  SOB (shortness of breath) [R06.02] Hypoxia [R09.02] Acute on chronic respiratory failure with hypoxemia (HCC) [J96.21]  DISCHARGE DIAGNOSIS:  acute on chronic hypoxic/Hypercapneic respiratory failure COVID-19 infection acute on chronic diastolic congestive heart failure morbid obesity SECONDARY DIAGNOSIS:   Past Medical History:  Diagnosis Date  . Anemia 10/16/2017  . Anxiety and depression   . CHF (congestive heart failure) ([place])   . Chronic hyponatremia 04/08/2007   Qualifier: Diagnosis of  By: Marca Ancona RMA, Lucy    . Closed comminuted intertrochanteric fracture of right femur ([place]) 11/07/2017  . COPD 04/08/2007   Qualifier: Diagnosis of  By: Marca Ancona RMA, Lucy    . Depression   . Diabetes mellitus, type 2 ([place])    pt denies but states that she has been treated for DM  . Essential hypertension 04/08/2007   Qualifier: Diagnosis of  By: Marca Ancona RMA, Lucy    . GERD (gastroesophageal reflux disease)   . Hyperlipidemia 10/07/2017  . Lumbar disc disease 04/09/2011  . MENOPAUSE, PREMATURE 04/08/2007   Qualifier: Diagnosis of  By: Marca Ancona RMA, Lucy    . 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 Osteoporosis 04/08/2007   Qualifier: Diagnosis of  By: Reatha Armour, Lucy    . Polycythemia secondary to smoking 04/09/2011  . 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 Schizoaffective disorder, bipolar type ([place]) 07/05/2011    HOSPITAL COURSE:   Natalie Thomas a58 y.o.femalewith a known history of COPD on chronic 2 L nasal cannula oxygen, diastolic congestive heart failure chronic, hypertension, morbid obesity, hyperlipidemia, type II diabetes,  schizoaffective disorder, chronic hyponatremia suspected psychogenic polydipsia comes to the emergency room from [place] with shortness of breath and increased oxygen requirement. Patient was reportedly tested positive for COVID past Tuesday (09/01/2019)  Natalie Thomas a58 y.o.femalewith a known history of COPD on chronic 2 L nasal cannula oxygen, diastolic congestive heart failure chronic, hypertension, morbid obesity, hyperlipidemia, type II diabetes, schizoaffective disorder, chronic hyponatremia suspected psychogenic polydipsia comes to the emergency room from [place] with shortness of breath and increased oxygen requirement.  Acute on chronic hypoxic/hypercapneicRespiratory failure secondary to COPD exacerbation in the setting of COVIDpneumonia and congestive heart failure acute on chronic diastolic -patient presented with increasing shortness of breath with increased oxygen requirement. She is on baseline 2 L nasal canula --P CO2 is 97 likely chronic--on diamox 500 mg bid (completed for 5 doses) -pt refused to wear BIPAP -she was testedCOVID positivepast Tuesday (09/01/2019) at [place] PCR negative here -po  Decadron-- for 5 more days -completed IVRemedesivir (5/5) -IV Lasix 40 mg BID--transition to 20 mg bid today -PRN inhalers -currently on 4 L/min [place] and sats 94-97%  Acute on chronic diastolic heart failure -Lasix 40 mg BID, monitor input output--change to lasix 20 mg qd -given BMI unable to doReds vest -Good UOP (~10 L  since admisison)  History of type II diabetes sugars are stable -will place on sliding scale insulin - not on any meds at home  Hypertension -resume home meds Metoprolol and nicardipine  schizoaffective disorder -since patient's mentation has improved will resume her Klonopin, Zyprexa, melatonin, Latuda  Chronic hyponatremia -patient was  recently admitted [facility].  -There was a  concern raised about psychogenic polydipsia versus SIADH per nephrology she was started on florinefand water restriction -sodium 133  chronic abdominal pain with history of abdominal hernia/gastritis/Gerd -multiple CT scans rebuild ventral hernia without incarceration in the past -EGD done in January 2021 showed diffuse gastritis -continue PPI  Obesity, Morbid  patient overall is best at baseline. We will discharge her back to her facility-- [facility]. Should follow-up with PCP there  Family Communication:pt has legal guardian--spoke with Natalie Thomas on 3/15 and updated Consults:PCCM Code Status:FULL DVT prophylaxis:lovenox Discharge Disposition: to rehab [facility]) to today  CONSULTS OBTAINED:    DRUG ALLERGIES:   Allergies  Allergen Reactions  . Clarithromycin Itching  . Penicillins Itching    Has tolerated cefazolin before  Has patient had a PCN reaction causing immediate rash, facial/tongue/throat swelling, SOB or lightheadedness with hypotension: No Has patient had a PCN reaction causing severe rash involving mucus membranes or skin necrosis: No Has patient had a PCN reaction that required hospitalization: Unknown Has patient had a PCN reaction occurring within the last 10 years: No If all of the above answers are "NO", then may proceed with Cephalosporin use.     DISCHARGE MEDICATIONS:   Allergies as of 09/08/2019      Reactions   Clarithromycin Itching   Penicillins Itching   Has tolerated cefazolin before Has patient had a PCN reaction causing immediate rash, facial/tongue/throat swelling, SOB or lightheadedness with hypotension: No Has patient had a PCN reaction causing severe rash involving mucus membranes or skin necrosis: No Has patient had a PCN reaction that required hospitalization: Unknown Has patient had a PCN reaction occurring within the last 10 years: No If all of the above answers are "NO", then may proceed with Cephalosporin use.       Medication List    STOP taking these medications   LORazepam 1 MG tablet Commonly known as: ATIVAN     TAKE these medications   acetaminophen 500 MG tablet Commonly known as: TYLENOL Take 1,000 mg by mouth every 8 (eight) hours as needed for mild pain or moderate pain. Not to exceed 3g/24h of tylenol from all sources.   albuterol 108 (90 Base) MCG/ACT inhaler Commonly known as: VENTOLIN HFA Inhale 2 puffs into the lungs every 6 (six) hours as needed for wheezing or shortness of breath.   albuterol (2.5 MG/3ML) 0.083% nebulizer solution Commonly known as: PROVENTIL Take 3 mLs (2.5 mg total) by nebulization every 4 (four) hours as needed for wheezing or shortness of breath.   ANTACID LIQUID PO Take 30 mLs by mouth every 6 (six) hours as needed (heartburn).   Breo Ellipta 100-25 MCG/INH Aepb Generic drug: fluticasone furoate-vilanterol Inhale 1 puff into the lungs daily.   buPROPion 150 MG 24 hr tablet Commonly known as: WELLBUTRIN XL Take 150 mg by mouth daily.   Calcium 600+D 600-800 MG-UNIT Tabs Generic drug: Calcium Carb-Cholecalciferol Take 1 tablet by mouth daily.   clonazePAM 1 MG tablet Commonly known as: KLONOPIN Take 1 tablet (1 mg total) by mouth 2 (two) times daily.   dexamethasone 6 MG tablet Commonly known as: DECADRON Take 1 tablet (6 mg total) by mouth daily for 5 days. Start taking on: September 09, 2019   docusate sodium 100 MG capsule Commonly known as: COLACE Take 100 mg by mouth 2 (two) times daily. What changed: Another medication with the same name was removed. Continue taking this medication, and follow the  directions you see here.   feeding supplement (ENSURE ENLIVE) Liqd Take 237 mLs by mouth daily.   fludrocortisone 0.1 MG tablet Commonly known as: FLORINEF Take 1 tablet (0.1 mg total) by mouth daily.   fluticasone 50 MCG/ACT nasal spray Commonly known as: FLONASE Place 2 sprays into both nostrils 2 (two) times daily.   furosemide 20  MG tablet Commonly known as: LASIX Take 20 mg by mouth daily.   guaiFENesin 600 MG 12 hr tablet Commonly known as: MUCINEX Take 1,200 mg by mouth daily.   hydrOXYzine 25 MG tablet Commonly known as: ATARAX/VISTARIL Take 25 mg by mouth every 6 (six) hours as needed for anxiety or itching.   ipratropium-albuterol 0.5-2.5 (3) MG/3ML Soln Commonly known as: DUONEB Take 3 mLs by nebulization 3 (three) times daily.   loperamide 2 MG capsule Commonly known as: IMODIUM Take 2 mg by mouth every 6 (six) hours as needed for diarrhea or loose stools.   loratadine 10 MG tablet Commonly known as: CLARITIN Take 10 mg by mouth daily.   lurasidone 20 MG Tabs tablet Commonly known as: LATUDA Take 1 tablet (20 mg total) by mouth 2 (two) times daily.   Melatonin 5 MG Tabs Take 5 mg by mouth at bedtime.   metoprolol tartrate 25 MG tablet Commonly known as: LOPRESSOR Take 1 tablet (25 mg total) by mouth 2 (two) times daily.   multivitamin with minerals Tabs tablet Take 1 tablet by mouth daily.   neomycin-bacitracin-polymyxin ointment Commonly known as: NEOSPORIN Apply 1 application topically as needed for wound care.   nicotine 21 mg/24hr patch Commonly known as: NICODERM CQ - dosed in mg/24 hours Place 21 mg onto the skin daily.   NIFEdipine 90 MG 24 hr tablet Commonly known as: PROCARDIA XL/NIFEDICAL-XL Take 90 mg by mouth daily.   nystatin powder Generic drug: nystatin Apply 1 application topically 2 (two) times daily as needed (irritation). Apply topically to reddened area twice daily as needed for irritation   OLANZapine 10 MG tablet Commonly known as: ZYPREXA Take 10 mg by mouth 2 (two) times daily.   ondansetron 8 MG tablet Commonly known as: ZOFRAN Take 8 mg by mouth every 6 (six) hours as needed for nausea or vomiting.   pantoprazole 40 MG tablet Commonly known as: Protonix Take 1 tablet (40 mg total) by mouth daily.   polyethylene glycol 17 g packet Commonly  known as: MiraLax Take 17 g by mouth daily. What changed: additional instructions   sodium chloride 0.65 % Soln nasal spray Commonly known as: OCEAN Place 2 sprays into both nostrils every 2 (two) hours as needed (dryness, irritation).   sucralfate 1 GM/10ML suspension Commonly known as: CARAFATE Take 10 mLs (1 g total) by mouth 4 (four) times daily -  with meals and at bedtime.   valproic acid 250 MG capsule Commonly known as: DEPAKENE Take 2 capsules (500 mg total) by mouth 3 (three) times daily.       If you experience worsening of your admission symptoms, develop shortness of breath, life threatening emergency, suicidal or homicidal thoughts you must seek medical attention immediately by calling 911 or calling your MD immediately  if symptoms less severe.  You Must read complete instructions/literature along with all the possible adverse reactions/side effects for all the Medicines you take and that have been prescribed to you. Take any new Medicines after you have completely understood and accept all the possible adverse reactions/side effects.   Please note  You were cared for  by a hospitalist during your hospital stay. If you have any questions about your discharge medications or the care you received while you were in the hospital after you are discharged, you can call the unit and asked to speak with the hospitalist on call if the hospitalist that took care of you is not available. Once you are discharged, your primary care physician will handle any further medical issues. Please note that NO REFILLS for any discharge medications will be authorized once you are discharged, as it is imperative that you return to your primary care physician (or establish a relationship with a primary care physician if you do not have one) for your aftercare needs so that they can reassess your need for medications and monitor your lab values. Today   SUBJECTIVE   No new complaints  VITAL SIGNS:   Blood pressure 118/61, pulse 76, temperature 98.3 F (36.8 C), temperature source Oral, resp. rate 18, height 5' (1.524 m), weight 97.1 kg, SpO2 95 %.  I/O:    Intake/Output Summary (Last 24 hours) at 09/08/2019 1120 Last data filed at 09/08/2019 1111 Gross per 24 hour  Intake 440 ml  Output 4250 ml  Net -3810 ml    PHYSICAL EXAMINATION:  GENERAL:  59 y.o.-year-old patient lying in the bed with no acute distress. Morbidly obese EYES: Pupils equal, round, reactive to light and accommodation. No scleral icterus.  HEENT: Head atraumatic, normocephalic. Oropharynx and nasopharynx clear.  NECK:  Supple, no jugular venous distention. No thyroid enlargement, no tenderness.  LUNGS: Normal breath sounds bilaterally, decreased breath sounds basically no wheezing, rales,rhonchi or crepitation. No use of accessory muscles of respiration. -- Chronic nasal cannula oxygen  CARDIOVASCULAR: S1, S2 normal. No murmurs, rubs, or gallops.  ABDOMEN: Soft, non-tender, non-distended. Bowel sounds present. No organomegaly or mass.  EXTREMITIES: No pedal edema, cyanosis, or clubbing.  NEUROLOGIC: Cranial nerves II through XII are intact. Muscle strength 5/5 in all extremities. Sensation intact. Gait not checked.  PSYCHIATRIC: The patient is alert and oriented x 3.  SKIN: No obvious rash, lesion, or ulcer.   DATA REVIEW:   CBC  Recent Labs  Lab 09/08/19 0619  WBC 5.9  HGB 12.6  HCT 39.2  PLT 145*    Chemistries  Recent Labs  Lab 09/08/19 0619  NA 133*  K 4.1  CL 93*  CO2 31  GLUCOSE 147*  BUN 29*  CREATININE 0.49  CALCIUM 8.6*  AST 19  ALT 21  ALKPHOS 54  BILITOT 0.5    Microbiology Results   Recent Results (from the past 240 hour(s))  Blood Culture (routine x 2)     Status: None (Preliminary result)   Collection Time: 09/04/19  2:13 PM   Specimen: BLOOD  Result Value Ref Range Status   Specimen Description BLOOD BLOOD LEFT ARM  Final   Special Requests   Final    BOTTLES DRAWN  AEROBIC AND ANAEROBIC Blood Culture adequate volume   Culture   Final    NO GROWTH 4 DAYS Performed at [facility], [address] [place], [place] [address]    Report Status PENDING  Incomplete  Respiratory Panel by RT PCR (Flu A&B, Covid) - Nasopharyngeal Swab     Status: None   Collection Time: 09/04/19  2:13 PM   Specimen: Nasopharyngeal Swab  Result Value Ref Range Status   SARS Coronavirus 2 by RT PCR NEGATIVE NEGATIVE Final    Comment: (NOTE) SARS-CoV-2 target nucleic acids are NOT DETECTED. The SARS-CoV-2  RNA is generally detectable in upper respiratoy specimens during the acute phase of infection. The lowest concentration of SARS-CoV-2 viral copies this assay can detect is 131 copies/mL. A negative result does not preclude SARS-Cov-2 infection and should not be used as the sole basis for treatment or other patient management decisions. A negative result may occur with  improper specimen collection/handling, submission of specimen other than nasopharyngeal swab, presence of viral mutation(s) within the areas targeted by this assay, and inadequate number of viral copies (<131 copies/mL). A negative result must be combined with clinical observations, patient history, and epidemiological information. The expected result is Negative. Fact Sheet for Patients:  PinkCheek.be Fact Sheet for Healthcare Providers:  GravelBags.it This test is not yet ap proved or cleared by the [place] FDA and  has been authorized for detection and/or diagnosis of SARS-CoV-2 by FDA under an Emergency Use Authorization (EUA). This EUA will remain  in effect (meaning this test can be used) for the duration of the COVID-19 declaration under Section 564(b)(1) of the Act, 21 U.S.C. section 360bbb-3(b)(1), unless the authorization is terminated or revoked sooner.    Influenza A by PCR NEGATIVE NEGATIVE Final   Influenza B by PCR  NEGATIVE NEGATIVE Final    Comment: (NOTE) The Xpert Xpress SARS-CoV-2/FLU/RSV assay is intended as an aid in  the diagnosis of influenza from Nasopharyngeal swab specimens and  should not be used as a sole basis for treatment. Nasal washings and  aspirates are unacceptable for Xpert Xpress SARS-CoV-2/FLU/RSV  testing. Fact Sheet for Patients: PinkCheek.be Fact Sheet for Healthcare Providers: GravelBags.it This test is not yet approved or cleared by the [place] FDA and  has been authorized for detection and/or diagnosis of SARS-CoV-2 by  FDA under an Emergency Use Authorization (EUA). This EUA will remain  in effect (meaning this test can be used) for the duration of the  Covid-19 declaration under Section 564(b)(1) of the Act, 21  U.S.C. section 360bbb-3(b)(1), unless the authorization is  terminated or revoked. Performed at [facility], [address] [place], [place] [address]   Blood Culture (routine x 2)     Status: None (Preliminary result)   Collection Time: 09/04/19  2:15 PM   Specimen: BLOOD  Result Value Ref Range Status   Specimen Description BLOOD BLOOD LEFT HAND  Final   Special Requests   Final    BOTTLES DRAWN AEROBIC AND ANAEROBIC Blood Culture adequate volume   Culture   Final    NO GROWTH 4 DAYS Performed at [facility], [address] [place], [place] [address]    Report Status PENDING  Incomplete  MRSA PCR Screening     Status: Abnormal   Collection Time: 09/04/19  5:57 PM   Specimen: Nasal Mucosa; Nasopharyngeal  Result Value Ref Range Status   MRSA by PCR POSITIVE (A) NEGATIVE Final    Comment:        The GeneXpert MRSA Assay (FDA approved for NASAL specimens only), is one component of a comprehensive MRSA colonization surveillance program. It is not intended to diagnose MRSA infection nor to guide or monitor treatment for MRSA infections. RESULT CALLED TO, READ  BACK BY AND VERIFIED WITH: MIKE BILLS 09/04/19 AT 1913 HS Performed at [facility], [address] [place], [place] [address]     RADIOLOGY:  No results found.   CODE STATUS:     Code Status Orders  (From admission, onward)         Start  Ordered   09/04/19 1743  Full code  Continuous     09/04/19 1743        Code Status History    Date Active Date Inactive Code Status Order ID Comments User Context   07/09/2019 1127 07/17/2019 2007 Full Code [id number]  Norval Morton, MD ED   06/30/2019 2159 07/06/2019 1341 Full Code [id number]  Ileene Musa T, DO ED   05/01/2019 1438 05/16/2019 0020 Full Code [id number]  Karmen Bongo, MD ED   04/22/2019 0100 04/24/2019 2206 Full Code [id number]  Elwyn Reach, MD Inpatient   03/09/2019 1918 03/16/2019 1713 Full Code [id number]  Orene Desanctis, DO ED   12/03/2018 1624 12/18/2018 2147 Full Code [id number]  Germain Osgood, PA-C ED   02/27/2018 2258 02/28/2018 0447 Full Code [id number]  Couture, Cortni S, PA-C ED   11/09/2017 0035 11/20/2017 1756 Full Code [id number]  Omar Person, NP Inpatient   11/07/2017 0854 11/09/2017 0035 Full Code [id number]  Rondel Jumbo, PA-C ED   11/07/2017 0854 11/07/2017 0854 Full Code [id number]  Rondel Jumbo, PA-C ED   10/16/2017 1003 10/19/2017 1908 Full Code [id number]  Jani Gravel, MD ED   10/07/2017 0030 10/08/2017 2013 Full Code [id number]  Norval Morton, MD ED   07/31/2017 0309 08/12/2017 1840 Full Code [id number]  Damita Lack, MD ED   08/18/2011 2311 08/21/2011 1248 Full Code [id number]  Mackie Pai., MD ED   08/18/2011 1803 08/18/2011 1819 Full Code [id number]  Linus Mako, [place] ED   07/21/2011 0310 07/24/2011 2323 Full Code [id number]  Garald Balding, NP ED   07/02/2011 1258 07/04/2011 1914 Full Code [id number]  Sheliah Mends ED   06/16/2011 0606 06/17/2011 2024 Full Code [id number]  Linus Mako, PA ED   Advance Care Planning Activity       TOTAL TIME TAKING CARE OF THIS PATIENT: *40* minutes.     Natalie Thomas M.D  [facility]    CC: Primary care physician; System, Pcp Not In

## 2019-09-08 NOTE — TOC Transition Note (Signed)
Transition of Care [facility]) - CM/SW Discharge Note   Patient Details  Name: Natalie Thomas MRN: [id number] Date of Birth: 06/01/1961  [facility] CM/SW Contact:  Shelbie Hutching, RN Phone Number: 09/08/2019, 11:43 AM   Clinical Narrative:    Patient is medically ready for discharge back to [facility]  Patient will be going to room 38B, bedside RN calling report to [phone].  This RNCM has arranged transport via [facility].    Final next level of care: [place] Barriers to Discharge: Barriers Resolved   Patient Goals and CMS Choice        Discharge Placement   Existing PASRR number confirmed : 09/07/19            Patient to be transferred to facility by: [facility] Name of family member notified: Legal Guardian informed via phone- [name] Patient and family notified of of transfer: 09/08/19  Discharge Plan and Services   Discharge Planning Services: CM Consult Post Acute Care Choice: Resumption of Svcs/PTA Provider, [place]                               Social Determinants of Health (SDOH) Interventions     Readmission Risk Interventions Readmission Risk Prevention Plan 07/10/2019 07/01/2019 03/10/2019  Transportation Screening Complete Complete Complete  Medication Review (RN Care Manager) Referral to Pharmacy Referral to Pharmacy Complete  PCP or Specialist appointment within 3-5 days of discharge Complete Complete Complete  HRI or [place] Not Complete Not Complete Complete  HRI or Home Care Consult Pt Refusal Comments pt is resident of [facility] No orders yet, patient is from [place] Care/Counseling Consult Complete Complete Complete  Palliative Care Screening Not Applicable Not Applicable Not [place] Not Applicable Not Applicable Complete  Some recent data might be hidden

## 2019-09-09 LAB — CULTURE, BLOOD (ROUTINE X 2)
Culture: NO GROWTH
Culture: NO GROWTH
Special Requests: ADEQUATE
Special Requests: ADEQUATE

## 2019-09-13 ENCOUNTER — Other Ambulatory Visit: Payer: Self-pay

## 2019-09-13 ENCOUNTER — Emergency Department: Payer: Medicare Other

## 2019-09-13 ENCOUNTER — Inpatient Hospital Stay
Admission: EM | Admit: 2019-09-13 | Discharge: 2019-09-16 | DRG: 189 | Disposition: A | Discharge status: Skilled Nursing Facility | Payer: Medicare Other | Attending: Internal Medicine | Admitting: Internal Medicine

## 2019-09-13 DIAGNOSIS — J9621 Acute and chronic respiratory failure with hypoxia: Secondary | ICD-10-CM | POA: Diagnosis present

## 2019-09-13 DIAGNOSIS — I110 Hypertensive heart disease with heart failure: Secondary | ICD-10-CM | POA: Diagnosis present

## 2019-09-13 DIAGNOSIS — I371 Nonrheumatic pulmonary valve insufficiency: Secondary | ICD-10-CM | POA: Diagnosis present

## 2019-09-13 DIAGNOSIS — D72829 Elevated white blood cell count, unspecified: Secondary | ICD-10-CM | POA: Diagnosis present

## 2019-09-13 DIAGNOSIS — Z880 Allergy status to penicillin: Secondary | ICD-10-CM

## 2019-09-13 DIAGNOSIS — Z6841 Body Mass Index (BMI) 40.0 and over, adult: Secondary | ICD-10-CM | POA: Diagnosis not present

## 2019-09-13 DIAGNOSIS — E871 Hypo-osmolality and hyponatremia: Secondary | ICD-10-CM | POA: Diagnosis present

## 2019-09-13 DIAGNOSIS — E860 Dehydration: Secondary | ICD-10-CM | POA: Diagnosis present

## 2019-09-13 DIAGNOSIS — F250 Schizoaffective disorder, bipolar type: Secondary | ICD-10-CM | POA: Diagnosis present

## 2019-09-13 DIAGNOSIS — I5043 Acute on chronic combined systolic (congestive) and diastolic (congestive) heart failure: Secondary | ICD-10-CM | POA: Diagnosis present

## 2019-09-13 DIAGNOSIS — F329 Major depressive disorder, single episode, unspecified: Secondary | ICD-10-CM | POA: Diagnosis present

## 2019-09-13 DIAGNOSIS — J9622 Acute and chronic respiratory failure with hypercapnia: Principal | ICD-10-CM | POA: Diagnosis present

## 2019-09-13 DIAGNOSIS — T380X5A Adverse effect of glucocorticoids and synthetic analogues, initial encounter: Secondary | ICD-10-CM | POA: Diagnosis present

## 2019-09-13 DIAGNOSIS — J9601 Acute respiratory failure with hypoxia: Secondary | ICD-10-CM

## 2019-09-13 DIAGNOSIS — E119 Type 2 diabetes mellitus without complications: Secondary | ICD-10-CM | POA: Diagnosis present

## 2019-09-13 DIAGNOSIS — I071 Rheumatic tricuspid insufficiency: Secondary | ICD-10-CM | POA: Diagnosis present

## 2019-09-13 DIAGNOSIS — K219 Gastro-esophageal reflux disease without esophagitis: Secondary | ICD-10-CM | POA: Diagnosis present

## 2019-09-13 DIAGNOSIS — Z6839 Body mass index (BMI) 39.0-39.9, adult: Secondary | ICD-10-CM | POA: Diagnosis present

## 2019-09-13 DIAGNOSIS — Z881 Allergy status to other antibiotic agents status: Secondary | ICD-10-CM

## 2019-09-13 DIAGNOSIS — U071 COVID-19: Secondary | ICD-10-CM | POA: Diagnosis not present

## 2019-09-13 DIAGNOSIS — B948 Sequelae of other specified infectious and parasitic diseases: Secondary | ICD-10-CM

## 2019-09-13 DIAGNOSIS — M810 Age-related osteoporosis without current pathological fracture: Secondary | ICD-10-CM | POA: Diagnosis present

## 2019-09-13 DIAGNOSIS — E785 Hyperlipidemia, unspecified: Secondary | ICD-10-CM | POA: Diagnosis present

## 2019-09-13 DIAGNOSIS — F259 Schizoaffective disorder, unspecified: Secondary | ICD-10-CM | POA: Diagnosis present

## 2019-09-13 DIAGNOSIS — I5031 Acute diastolic (congestive) heart failure: Secondary | ICD-10-CM | POA: Diagnosis not present

## 2019-09-13 DIAGNOSIS — J1282 Pneumonia due to coronavirus disease 2019: Secondary | ICD-10-CM | POA: Diagnosis not present

## 2019-09-13 DIAGNOSIS — J449 Chronic obstructive pulmonary disease, unspecified: Secondary | ICD-10-CM | POA: Diagnosis present

## 2019-09-13 DIAGNOSIS — F419 Anxiety disorder, unspecified: Secondary | ICD-10-CM | POA: Diagnosis present

## 2019-09-13 DIAGNOSIS — Z20822 Contact with and (suspected) exposure to covid-19: Secondary | ICD-10-CM | POA: Diagnosis present

## 2019-09-13 DIAGNOSIS — R001 Bradycardia, unspecified: Secondary | ICD-10-CM | POA: Diagnosis not present

## 2019-09-13 DIAGNOSIS — I10 Essential (primary) hypertension: Secondary | ICD-10-CM | POA: Diagnosis present

## 2019-09-13 DIAGNOSIS — R0902 Hypoxemia: Secondary | ICD-10-CM

## 2019-09-13 DIAGNOSIS — F17210 Nicotine dependence, cigarettes, uncomplicated: Secondary | ICD-10-CM | POA: Diagnosis present

## 2019-09-13 DIAGNOSIS — Z8701 Personal history of pneumonia (recurrent): Secondary | ICD-10-CM

## 2019-09-13 DIAGNOSIS — E872 Acidosis: Secondary | ICD-10-CM | POA: Diagnosis present

## 2019-09-13 DIAGNOSIS — E669 Obesity, unspecified: Secondary | ICD-10-CM | POA: Diagnosis present

## 2019-09-13 DIAGNOSIS — Z7952 Long term (current) use of systemic steroids: Secondary | ICD-10-CM

## 2019-09-13 DIAGNOSIS — J9602 Acute respiratory failure with hypercapnia: Secondary | ICD-10-CM

## 2019-09-13 DIAGNOSIS — R0602 Shortness of breath: Secondary | ICD-10-CM | POA: Diagnosis present

## 2019-09-13 DIAGNOSIS — Z79899 Other long term (current) drug therapy: Secondary | ICD-10-CM

## 2019-09-13 LAB — BLOOD GAS, ARTERIAL
Acid-Base Excess: 12.1 mmol/L — ABNORMAL HIGH (ref 0.0–2.0)
Allens test (pass/fail): POSITIVE — AB
Bicarbonate: 40.5 mmol/L — ABNORMAL HIGH (ref 20.0–28.0)
Delivery systems: POSITIVE
Expiratory PAP: 5
FIO2: 50
Inspiratory PAP: 10
O2 Saturation: 96.9 %
Patient temperature: 37
pCO2 arterial: 70 mmHg (ref 32.0–48.0)
pH, Arterial: 7.37 (ref 7.350–7.450)
pO2, Arterial: 92 mmHg (ref 83.0–108.0)

## 2019-09-13 LAB — BLOOD GAS, VENOUS
Acid-Base Excess: 7.6 mmol/L — ABNORMAL HIGH (ref 0.0–2.0)
Bicarbonate: 37.7 mmol/L — ABNORMAL HIGH (ref 20.0–28.0)
Delivery systems: POSITIVE
FIO2: 0.5
O2 Saturation: 87.3 %
Patient temperature: 37
pCO2, Ven: 82 mmHg (ref 44.0–60.0)
pH, Ven: 7.27 (ref 7.250–7.430)
pO2, Ven: 61 mmHg — ABNORMAL HIGH (ref 32.0–45.0)

## 2019-09-13 LAB — BASIC METABOLIC PANEL
Anion gap: 7 (ref 5–15)
BUN: 21 mg/dL — ABNORMAL HIGH (ref 6–20)
CO2: 35 mmol/L — ABNORMAL HIGH (ref 22–32)
Calcium: 8.6 mg/dL — ABNORMAL LOW (ref 8.9–10.3)
Chloride: 88 mmol/L — ABNORMAL LOW (ref 98–111)
Creatinine, Ser: 0.66 mg/dL (ref 0.44–1.00)
GFR calc Af Amer: >60 mL/min (ref >60)
GFR calc non Af Amer: >60 mL/min (ref >60)
Glucose, Bld: 188 mg/dL — ABNORMAL HIGH (ref 70–99)
Potassium: 4.6 mmol/L (ref 3.5–5.1)
Sodium: 130 mmol/L — ABNORMAL LOW (ref 135–145)

## 2019-09-13 LAB — RESPIRATORY PANEL BY RT PCR (FLU A&B, COVID)
Influenza A by PCR: NEGATIVE
Influenza B by PCR: NEGATIVE
SARS Coronavirus 2 by RT PCR: NEGATIVE

## 2019-09-13 LAB — CBC WITH DIFFERENTIAL/PLATELET
Abs Immature Granulocytes: 0.08 10*3/uL — ABNORMAL HIGH (ref 0.00–0.07)
Basophils Absolute: 0.1 10*3/uL (ref 0.0–0.1)
Basophils Relative: 0 %
Eosinophils Absolute: 0 10*3/uL (ref 0.0–0.5)
Eosinophils Relative: 0 %
HCT: 43 % (ref 36.0–46.0)
Hemoglobin: 13.5 g/dL (ref 12.0–15.0)
Immature Granulocytes: 1 %
Lymphocytes Relative: 6 %
Lymphs Abs: 0.9 10*3/uL (ref 0.7–4.0)
MCH: 29.5 pg (ref 26.0–34.0)
MCHC: 31.4 g/dL (ref 30.0–36.0)
MCV: 93.9 fL (ref 80.0–100.0)
Monocytes Absolute: 0.4 10*3/uL (ref 0.1–1.0)
Monocytes Relative: 3 %
Neutro Abs: 13.8 10*3/uL — ABNORMAL HIGH (ref 1.7–7.7)
Neutrophils Relative %: 90 %
Platelets: 181 10*3/uL (ref 150–400)
RBC: 4.58 MIL/uL (ref 3.87–5.11)
RDW: 13.7 % (ref 11.5–15.5)
WBC: 15.3 10*3/uL — ABNORMAL HIGH (ref 4.0–10.5)
nRBC: 0 % (ref 0.0–0.2)

## 2019-09-13 LAB — BRAIN NATRIURETIC PEPTIDE: B Natriuretic Peptide: 1107 pg/mL — ABNORMAL HIGH (ref 0.0–100.0)

## 2019-09-13 LAB — TROPONIN I (HIGH SENSITIVITY)
Troponin I (High Sensitivity): 18 ng/L — ABNORMAL HIGH (ref <18)
Troponin I (High Sensitivity): 19 ng/L — ABNORMAL HIGH (ref <18)

## 2019-09-13 LAB — LACTIC ACID, PLASMA: Lactic Acid, Venous: 1.4 mmol/L (ref 0.5–1.9)

## 2019-09-13 LAB — GLUCOSE, CAPILLARY: Glucose-Capillary: 180 mg/dL — ABNORMAL HIGH (ref 70–99)

## 2019-09-13 MED ORDER — ONDANSETRON HCL 4 MG PO TABS: 4.0000 mg | ORAL_TABLET | Freq: Four times a day (QID) | ORAL | Status: DC | PRN

## 2019-09-13 MED ORDER — LURASIDONE HCL 40 MG PO TABS
20.0000 mg | ORAL_TABLET | Freq: Two times a day (BID) | ORAL | Status: DC
  Administered 2019-09-13 – 2019-09-16 (×6): 20 mg via ORAL
  Filled 2019-09-13 (×8): qty 1

## 2019-09-13 MED ORDER — DEXAMETHASONE SODIUM PHOSPHATE 10 MG/ML IJ SOLN
6.0000 mg | Freq: Once | INTRAMUSCULAR | Status: AC
  Administered 2019-09-13: 6 mg via INTRAVENOUS
  Filled 2019-09-13: qty 0.6

## 2019-09-13 MED ORDER — IPRATROPIUM-ALBUTEROL 0.5-2.5 (3) MG/3ML IN SOLN
3.0000 mL | Freq: Three times a day (TID) | RESPIRATORY_TRACT | Status: DC
  Filled 2019-09-13: qty 3

## 2019-09-13 MED ORDER — BUPROPION HCL ER (XL) 150 MG PO TB24
150.0000 mg | ORAL_TABLET | Freq: Every day | ORAL | Status: DC
  Administered 2019-09-15 – 2019-09-16 (×2): 150 mg via ORAL
  Filled 2019-09-13 (×3): qty 1

## 2019-09-13 MED ORDER — METOPROLOL TARTRATE 25 MG PO TABS
25.0000 mg | ORAL_TABLET | Freq: Two times a day (BID) | ORAL | Status: DC
  Administered 2019-09-13: 25 mg via ORAL
  Filled 2019-09-13: qty 1

## 2019-09-13 MED ORDER — FLUTICASONE FUROATE-VILANTEROL 100-25 MCG/INH IN AEPB
1.0000 | INHALATION_SPRAY | Freq: Every day | RESPIRATORY_TRACT | Status: DC
  Administered 2019-09-13 – 2019-09-16 (×4): 1 via RESPIRATORY_TRACT
  Filled 2019-09-13: qty 28

## 2019-09-13 MED ORDER — OLANZAPINE 10 MG PO TABS
10.0000 mg | ORAL_TABLET | Freq: Two times a day (BID) | ORAL | Status: DC
  Administered 2019-09-13 – 2019-09-16 (×6): 10 mg via ORAL
  Filled 2019-09-13 (×7): qty 1

## 2019-09-13 MED ORDER — SUCRALFATE 1 GM/10ML PO SUSP
1.0000 g | Freq: Three times a day (TID) | ORAL | Status: DC
  Administered 2019-09-13 – 2019-09-16 (×11): 1 g via ORAL
  Filled 2019-09-13 (×15): qty 10

## 2019-09-13 MED ORDER — SODIUM CHLORIDE 0.9% FLUSH
3.0000 mL | Freq: Two times a day (BID) | INTRAVENOUS | Status: DC
  Administered 2019-09-13 – 2019-09-16 (×6): 3 mL via INTRAVENOUS

## 2019-09-13 MED ORDER — POLYETHYLENE GLYCOL 3350 17 G PO PACK
17.0000 g | PACK | Freq: Every day | ORAL | Status: DC
  Administered 2019-09-15 – 2019-09-16 (×2): 17 g via ORAL
  Filled 2019-09-13 (×2): qty 1

## 2019-09-13 MED ORDER — LOPERAMIDE HCL 2 MG PO CAPS: 2.0000 mg | ORAL_CAPSULE | Freq: Four times a day (QID) | ORAL | Status: DC | PRN

## 2019-09-13 MED ORDER — ADULT MULTIVITAMIN W/MINERALS CH
1.0000 | ORAL_TABLET | Freq: Every day | ORAL | Status: DC
  Administered 2019-09-15 – 2019-09-16 (×2): 1 via ORAL
  Filled 2019-09-13 (×3): qty 1

## 2019-09-13 MED ORDER — CALCIUM CARBONATE ANTACID 500 MG PO CHEW
400.0000 mg | CHEWABLE_TABLET | Freq: Every day | ORAL | Status: DC
  Administered 2019-09-15 – 2019-09-16 (×2): 400 mg via ORAL
  Filled 2019-09-13 (×2): qty 2

## 2019-09-13 MED ORDER — NYSTATIN 100000 UNIT/GM EX POWD
1.0000 "application " | Freq: Two times a day (BID) | CUTANEOUS | Status: DC | PRN
  Filled 2019-09-13: qty 15

## 2019-09-13 MED ORDER — ENSURE ENLIVE PO LIQD
237.0000 mL | ORAL | Status: DC
  Administered 2019-09-14 – 2019-09-16 (×3): 237 mL via ORAL

## 2019-09-13 MED ORDER — GUAIFENESIN ER 600 MG PO TB12
1200.0000 mg | ORAL_TABLET | Freq: Every day | ORAL | Status: DC
  Administered 2019-09-14 – 2019-09-16 (×3): 1200 mg via ORAL
  Filled 2019-09-13 (×3): qty 2

## 2019-09-13 MED ORDER — PIPERACILLIN-TAZOBACTAM 3.375 G IVPB 30 MIN
3.3750 g | Freq: Once | INTRAVENOUS | Status: AC
  Administered 2019-09-13: 3.375 g via INTRAVENOUS
  Filled 2019-09-13: qty 50

## 2019-09-13 MED ORDER — DOCUSATE SODIUM 100 MG PO CAPS
100.0000 mg | ORAL_CAPSULE | Freq: Two times a day (BID) | ORAL | Status: DC
  Administered 2019-09-14 – 2019-09-16 (×4): 100 mg via ORAL
  Filled 2019-09-13 (×4): qty 1

## 2019-09-13 MED ORDER — NICOTINE 21 MG/24HR TD PT24
21.0000 mg | MEDICATED_PATCH | Freq: Every day | TRANSDERMAL | Status: DC
  Administered 2019-09-13 – 2019-09-16 (×4): 21 mg via TRANSDERMAL
  Filled 2019-09-13 (×4): qty 1

## 2019-09-13 MED ORDER — IOHEXOL 350 MG/ML SOLN
75.0000 mL | Freq: Once | INTRAVENOUS | Status: AC | PRN
  Administered 2019-09-13: 75 mL via INTRAVENOUS

## 2019-09-13 MED ORDER — MELATONIN 5 MG PO TABS
5.0000 mg | ORAL_TABLET | Freq: Every day | ORAL | Status: DC
  Administered 2019-09-13 – 2019-09-14 (×2): 5 mg via ORAL
  Filled 2019-09-13 (×2): qty 1

## 2019-09-13 MED ORDER — PANTOPRAZOLE SODIUM 40 MG PO TBEC
40.0000 mg | DELAYED_RELEASE_TABLET | Freq: Every day | ORAL | Status: DC
  Administered 2019-09-14 – 2019-09-16 (×3): 40 mg via ORAL
  Filled 2019-09-13 (×3): qty 1

## 2019-09-13 MED ORDER — CHLORHEXIDINE GLUCONATE CLOTH 2 % EX PADS
6.0000 | MEDICATED_PAD | Freq: Every day | CUTANEOUS | Status: DC
  Administered 2019-09-13 – 2019-09-14 (×2): 6 via TOPICAL

## 2019-09-13 MED ORDER — MORPHINE SULFATE (PF) 4 MG/ML IV SOLN
4.0000 mg | Freq: Once | INTRAVENOUS | Status: AC
  Administered 2019-09-13: 4 mg via INTRAVENOUS
  Filled 2019-09-13: qty 1

## 2019-09-13 MED ORDER — NIFEDIPINE ER OSMOTIC RELEASE 90 MG PO TB24
90.0000 mg | ORAL_TABLET | Freq: Every day | ORAL | Status: DC
  Filled 2019-09-13 (×2): qty 1

## 2019-09-13 MED ORDER — IPRATROPIUM-ALBUTEROL 20-100 MCG/ACT IN AERS
1.0000 | INHALATION_SPRAY | Freq: Three times a day (TID) | RESPIRATORY_TRACT | Status: DC
  Administered 2019-09-14 – 2019-09-16 (×7): 1 via RESPIRATORY_TRACT
  Filled 2019-09-13: qty 4

## 2019-09-13 MED ORDER — FUROSEMIDE 10 MG/ML IJ SOLN
40.0000 mg | Freq: Once | INTRAMUSCULAR | Status: AC
  Administered 2019-09-13: 40 mg via INTRAVENOUS
  Filled 2019-09-13: qty 4

## 2019-09-13 MED ORDER — INSULIN ASPART 100 UNIT/ML ~~LOC~~ SOLN
6.0000 [IU] | Freq: Three times a day (TID) | SUBCUTANEOUS | Status: DC
  Administered 2019-09-14 – 2019-09-16 (×8): 6 [IU] via SUBCUTANEOUS
  Filled 2019-09-13 (×8): qty 1

## 2019-09-13 MED ORDER — ONDANSETRON HCL 4 MG/2ML IJ SOLN: 4.0000 mg | Freq: Four times a day (QID) | INTRAMUSCULAR | Status: DC | PRN

## 2019-09-13 MED ORDER — HYDROXYZINE HCL 25 MG PO TABS
25.0000 mg | ORAL_TABLET | Freq: Four times a day (QID) | ORAL | Status: DC | PRN
  Administered 2019-09-13 – 2019-09-16 (×5): 25 mg via ORAL
  Filled 2019-09-13 (×8): qty 1

## 2019-09-13 MED ORDER — CALCIUM CARB-CHOLECALCIFEROL 600-800 MG-UNIT PO TABS: 1.0000 | ORAL_TABLET | Freq: Every day | ORAL | Status: DC

## 2019-09-13 MED ORDER — FLUTICASONE PROPIONATE 50 MCG/ACT NA SUSP
2.0000 | Freq: Two times a day (BID) | NASAL | Status: DC
  Administered 2019-09-13 – 2019-09-16 (×5): 2 via NASAL
  Filled 2019-09-13: qty 16

## 2019-09-13 MED ORDER — INSULIN ASPART 100 UNIT/ML ~~LOC~~ SOLN
0.0000 [IU] | Freq: Three times a day (TID) | SUBCUTANEOUS | Status: DC
  Administered 2019-09-13: 3 [IU] via SUBCUTANEOUS
  Administered 2019-09-14: 7 [IU] via SUBCUTANEOUS
  Administered 2019-09-14 – 2019-09-15 (×2): 3 [IU] via SUBCUTANEOUS
  Administered 2019-09-15: 7 [IU] via SUBCUTANEOUS
  Administered 2019-09-16: 4 [IU] via SUBCUTANEOUS
  Filled 2019-09-13 (×7): qty 1

## 2019-09-13 MED ORDER — SODIUM CHLORIDE 0.9 % IV SOLN: 250.0000 mL | INTRAVENOUS | Status: DC | PRN

## 2019-09-13 MED ORDER — CLONAZEPAM 1 MG PO TABS
1.0000 mg | ORAL_TABLET | Freq: Two times a day (BID) | ORAL | Status: DC
  Administered 2019-09-13 – 2019-09-16 (×6): 1 mg via ORAL
  Filled 2019-09-13 (×6): qty 1

## 2019-09-13 MED ORDER — DEXAMETHASONE 4 MG PO TABS
6.0000 mg | ORAL_TABLET | Freq: Every day | ORAL | Status: DC
  Filled 2019-09-13 (×2): qty 1.5

## 2019-09-13 MED ORDER — SODIUM CHLORIDE 0.9% FLUSH: 3.0000 mL | INTRAVENOUS | Status: DC | PRN

## 2019-09-13 MED ORDER — LORATADINE 10 MG PO TABS
10.0000 mg | ORAL_TABLET | Freq: Every day | ORAL | Status: DC
  Administered 2019-09-15 – 2019-09-16 (×2): 10 mg via ORAL
  Filled 2019-09-13 (×2): qty 1

## 2019-09-13 MED ORDER — VANCOMYCIN HCL IN DEXTROSE 1-5 GM/200ML-% IV SOLN
1000.0000 mg | Freq: Once | INTRAVENOUS | Status: AC
  Administered 2019-09-13: 1000 mg via INTRAVENOUS
  Filled 2019-09-13: qty 200

## 2019-09-13 MED ORDER — ALUM & MAG HYDROXIDE-SIMETH 200-200-20 MG/5ML PO SUSP: 30.0000 mL | Freq: Four times a day (QID) | ORAL | Status: DC | PRN

## 2019-09-13 MED ORDER — FLUDROCORTISONE ACETATE 0.1 MG PO TABS
0.1000 mg | ORAL_TABLET | Freq: Every day | ORAL | Status: DC
  Administered 2019-09-14 – 2019-09-16 (×3): 0.1 mg via ORAL
  Filled 2019-09-13 (×4): qty 1

## 2019-09-13 MED ORDER — VALPROIC ACID 250 MG PO CAPS
500.0000 mg | ORAL_CAPSULE | Freq: Three times a day (TID) | ORAL | Status: DC
  Administered 2019-09-13 – 2019-09-16 (×8): 500 mg via ORAL
  Filled 2019-09-13 (×11): qty 2

## 2019-09-13 MED ORDER — ACETAMINOPHEN 500 MG PO TABS
1000.0000 mg | ORAL_TABLET | Freq: Three times a day (TID) | ORAL | Status: DC | PRN
  Administered 2019-09-14 – 2019-09-16 (×2): 1000 mg via ORAL
  Filled 2019-09-13 (×2): qty 2

## 2019-09-13 MED ORDER — ENOXAPARIN SODIUM 40 MG/0.4ML ~~LOC~~ SOLN
40.0000 mg | SUBCUTANEOUS | Status: DC
  Administered 2019-09-13 – 2019-09-15 (×3): 40 mg via SUBCUTANEOUS
  Filled 2019-09-13 (×3): qty 0.4

## 2019-09-13 MED ORDER — SALINE SPRAY 0.65 % NA SOLN
2.0000 | NASAL | Status: DC | PRN
  Filled 2019-09-13: qty 44

## 2019-09-13 MED ORDER — CHOLECALCIFEROL 10 MCG (400 UNIT) PO TABS
800.0000 [IU] | ORAL_TABLET | Freq: Every day | ORAL | Status: DC
  Administered 2019-09-15 – 2019-09-16 (×2): 800 [IU] via ORAL
  Filled 2019-09-13 (×4): qty 2

## 2019-09-13 NOTE — ED Notes (Signed)
Pt changed of urine and stool and given new brief.

## 2019-09-13 NOTE — ED Notes (Signed)
Pt taken off bipap OK per EDP to trial. Pt on 6L [place] and at 93%. Pt states that she feels much better than when she arrived.

## 2019-09-13 NOTE — H&P (Signed)
History and Physical    Natalie Thomas [id number] DOB: [birth year] DOA: 09/13/2019  PCP: System, Pcp Not In   Patient coming from: Rehab  I have personally briefly reviewed patient's old medical records in [place]  Chief Complaint: Shortness of breath  HPI: Natalie Thomas is a 59 y.o. 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  Patient was recently discharged from the hospital on 09/08/19 after treatment for acute on chronic respiratory failure secondary to acute CHF exacerbation and Covid pneumonia.  Patient was said to have tested positive for the COVID-19 virus on 09/01/19 but had a negative PCR test here in the hospital.  She was brought into the ER by EMS and was said to have a pulse oximetry of 84% on her home 2 liters of oxygen.  She arrived on CPAP and had to be placed on a BiPAP.  She denies having any chest pain, diaphoresis, palpitations, nausea, vomiting,abdominal pain, dizziness or lightheadedness  Labs revealed significantly elevated BNP levels and chest x ray showed diffuse bilateral interstitial opacities and small bilateral effusions. Findings most suspicious for pulmonary edema, infection not excluded.     ED Course: The patient had presented for dyspnea and hypoxia on CPAP. She was subsequently changed over to BiPAP which she tolerated much more easily. Patient's labs are nonspecific and likely reflects multiple etiologies including COPD and CHF. Patient's imaging again is nonspecific, resemble CHF, COPD and COVID-19.  She received broad-spectrum antibiotic therapy. She remains on BiPAP.  She has already completed courses of  steroids and remdesivir. She will be admitted to the hospital for further evaluation  Review of Systems: As per HPI otherwise 10 point review of systems negative.    Past Medical History:  Diagnosis Date  . Anemia 10/16/2017  . Anxiety and depression   . CHF (congestive heart failure) ([place])   . Chronic hyponatremia 04/08/2007   Qualifier: Diagnosis of  By: Marca Ancona RMA, Lucy    . Closed comminuted intertrochanteric fracture of right femur ([place]) 11/07/2017  . COPD 04/08/2007   Qualifier: Diagnosis of  By: Marca Ancona RMA, Lucy    . Depression   . Diabetes mellitus, type 2 ([place])    pt denies but states that she has been treated for DM  . Essential hypertension 04/08/2007   Qualifier: Diagnosis of  By: Marca Ancona RMA, Lucy    . GERD (gastroesophageal reflux disease)   . Hyperlipidemia 10/07/2017  . Lumbar disc disease 04/09/2011  . MENOPAUSE, PREMATURE 04/08/2007   Qualifier: Diagnosis of  By: Marca Ancona RMA, Lucy    . 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 Osteoporosis 04/08/2007   Qualifier: Diagnosis of  By: Reatha Armour, Lucy    . Polycythemia secondary to smoking 04/09/2011  . 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 Schizoaffective disorder, bipolar type ([place]) 07/05/2011    Past Surgical History:  Procedure Laterality Date  . BIOPSY  07/11/2019   Procedure: BIOPSY;  Surgeon: Jerene Bears, MD;  Location: [facility] ENDOSCOPY;  Service: Gastroenterology;;  . ESOPHAGOGASTRODUODENOSCOPY (EGD) WITH PROPOFOL N/A 07/11/2019   Procedure: ESOPHAGOGASTRODUODENOSCOPY (EGD) WITH PROPOFOL;  Surgeon: Jerene Bears, MD;  Location: [place];  Service: Gastroenterology;  Laterality: N/A;  . EYE SURGERY     on left  eye, pt states that she sees bad out of the right eye and needs surgery there  . INTRAMEDULLARY (IM) NAIL INTERTROCHANTERIC Right 11/13/2017   Procedure: INTRAMEDULLARY (IM) NAIL INTERTROCHANTRIC;  Surgeon: Shona Needles, MD;  Location: [place];  Service:  Orthopedics;  Laterality: Right;  . PELVIC FRACTURE SURGERY       reports that she has been smoking cigarettes. She has been smoking about 0.25 packs per day. She has never used smokeless tobacco. She reports that she does not drink alcohol or use drugs.  Allergies  Allergen Reactions  . Clarithromycin Itching  . Penicillins Itching    Has tolerated cefazolin before  Has patient had a PCN reaction causing immediate rash, facial/tongue/throat swelling, SOB or lightheadedness with hypotension: No Has patient had a PCN reaction causing severe rash involving mucus membranes or skin necrosis: No Has patient had a PCN reaction that required hospitalization: Unknown Has patient had a PCN reaction occurring within the last 10 years: No If all of the above answers are "NO", then may proceed with Cephalosporin use.     History reviewed. No pertinent family history.   Prior to Admission medications   Medication Sig Start Date End Date Taking? Authorizing Provider  acetaminophen (TYLENOL) 500 MG tablet Take 1,000 mg by mouth every 8 (eight) hours as needed for mild pain or moderate pain. Not to exceed 3g/24h of tylenol from all sources.    [provider]  albuterol (PROVENTIL) (2.5 MG/3ML) 0.083% nebulizer solution Take 3 mLs (2.5 mg total) by nebulization every 4 (four) hours as needed for wheezing or shortness of breath. 12/11/18   Swayze, Ava, DO  albuterol (VENTOLIN HFA) 108 (90 Base) MCG/ACT inhaler Inhale 2 puffs into the lungs every 6 (six) hours as needed for wheezing or shortness of breath.    [provider]  Alum & Mag Hydroxide-Simeth (ANTACID LIQUID PO) Take 30 mLs by mouth every 6 (six) hours as needed (heartburn).    [provider]  buPROPion (WELLBUTRIN XL) 150 MG 24 hr tablet Take 150 mg by mouth daily.    [provider]  Calcium Carb-Cholecalciferol (CALCIUM 600+D) 600-800 MG-UNIT TABS Take 1 tablet by mouth daily.    [provider]    clonazePAM (KLONOPIN) 1 MG tablet Take 1 tablet (1 mg total) by mouth 2 (two) times daily. 07/16/19   Mercy Riding, MD  dexamethasone (DECADRON) 6 MG tablet Take 1 tablet (6 mg total) by mouth daily for 5 days. 09/09/19 09/14/19  Fritzi Mandes, MD  docusate sodium (COLACE) 100 MG capsule Take 100 mg by mouth 2 (two) times daily.     [provider]  feeding supplement, ENSURE ENLIVE, (ENSURE ENLIVE) LIQD Take 237 mLs by mouth daily. 03/14/19   Desiree Hane, MD  fludrocortisone (FLORINEF) 0.1 MG tablet Take 1 tablet (0.1 mg total) by mouth daily. 07/17/19   Mercy Riding, MD  fluticasone (FLONASE) 50 MCG/ACT nasal spray Place 2 sprays into both nostrils 2 (two) times daily.    [provider]  fluticasone furoate-vilanterol (BREO ELLIPTA) 100-25 MCG/INH AEPB Inhale 1 puff into the lungs daily.    [provider]  furosemide (LASIX) 20 MG tablet Take 20 mg by mouth daily.     [provider]  guaiFENesin (MUCINEX) 600 MG 12 hr tablet Take 1,200 mg by mouth daily.    [provider]  hydrOXYzine (ATARAX/VISTARIL) 25 MG  tablet Take 25 mg by mouth every 6 (six) hours as needed for anxiety or itching.    [provider]  ipratropium-albuterol (DUONEB) 0.5-2.5 (3) MG/3ML SOLN Take 3 mLs by nebulization 3 (three) times daily.    [provider]  loperamide (IMODIUM) 2 MG capsule Take 2 mg by mouth every 6 (six) hours as needed for diarrhea or loose stools.  06/28/19   [provider]  loratadine (CLARITIN) 10 MG tablet Take 10 mg by mouth daily.    [provider]  lurasidone (LATUDA) 20 MG TABS tablet Take 1 tablet (20 mg total) by mouth 2 (two) times daily. 05/15/19   Hongalgi, Lenis Dickinson, MD  Melatonin 5 MG TABS Take 5 mg by mouth at bedtime.    [provider]  metoprolol tartrate (LOPRESSOR) 25 MG tablet Take 1 tablet (25 mg total) by mouth 2 (two) times daily. 12/17/18   Kayleen Memos, DO  Multiple Vitamin  (MULTIVITAMIN WITH MINERALS) TABS tablet Take 1 tablet by mouth daily. 03/15/19   Desiree Hane, MD  neomycin-bacitracin-polymyxin (NEOSPORIN) ointment Apply 1 application topically as needed for wound care.    [provider]  nicotine (NICODERM CQ - DOSED IN MG/24 HOURS) 21 mg/24hr patch Place 21 mg onto the skin daily.     [provider]  NIFEdipine (PROCARDIA XL/ADALAT-CC) 90 MG 24 hr tablet Take 90 mg by mouth daily.    [provider]  nystatin (NYSTATIN) powder Apply 1 application topically 2 (two) times daily as needed (irritation). Apply topically to reddened area twice daily as needed for irritation    [provider]  OLANZapine (ZYPREXA) 10 MG tablet Take 10 mg by mouth 2 (two) times daily.    [provider]  ondansetron (ZOFRAN) 8 MG tablet Take 8 mg by mouth every 6 (six) hours as needed for nausea or vomiting.    [provider]  pantoprazole (PROTONIX) 40 MG tablet Take 1 tablet (40 mg total) by mouth daily. 07/16/19 01/12/20  Mercy Riding, MD  polyethylene glycol (MIRALAX) 17 g packet Take 17 g by mouth daily. Patient taking differently: Take 17 g by mouth daily. Mix 1 capful (17 g) in 4-8 oz of water or juice and give by mouth once daily for bowel regimen 12/11/18   Swayze, Ava, DO  sodium chloride (OCEAN) 0.65 % SOLN nasal spray Place 2 sprays into both nostrils every 2 (two) hours as needed (dryness, irritation).    [provider]  sucralfate (CARAFATE) 1 GM/10ML suspension Take 10 mLs (1 g total) by mouth 4 (four) times daily -  with meals and at bedtime. 07/16/19   Mercy Riding, MD  valproic acid (DEPAKENE) 250 MG capsule Take 2 capsules (500 mg total) by mouth 3 (three) times daily. 09/08/19   Fritzi Mandes, MD    Physical Exam: Vitals:   09/13/19 1430 09/13/19 1500 09/13/19 1515 09/13/19 1530  BP: (!) 195/67 (!) 183/81  (!) 184/78  Pulse: (!) 58 60 (!) 57 (!) 57  Resp: 17 17 16 15   Temp:      TempSrc:       SpO2: 95% 94% 95% 94%  Weight:      Height:         Vitals:   09/13/19 1430 09/13/19 1500 09/13/19 1515 09/13/19 1530  BP: (!) 195/67 (!) 183/81  (!) 184/78  Pulse: (!) 58 60 (!) 57 (!) 57  Resp: 17 17 16 15   Temp:  TempSrc:      SpO2: 95% 94% 95% 94%  Weight:      Height:        Constitutional: NAD, alert and oriented x 3. Morbidly obese. Conversational dyspnea Eyes: PERRL, lids and conjunctivae normal ENMT: Mucous membranes are moist.  Neck: normal, supple, no masses, no thyromegaly Respiratory: Rales at the bases, no wheezing, no crackles. Normal respiratory effort. No accessory muscle use.  Cardiovascular: Regular rate and rhythm, no murmurs / rubs / gallops. 2+ extremity edema. 2+ pedal pulses. No carotid bruits.  Abdomen: no tenderness, no masses palpated. No hepatosplenomegaly. Bowel sounds positive.  Musculoskeletal: no clubbing / cyanosis. No joint deformity upper and lower extremities.  Skin: no rashes, lesions, ulcers.  Neurologic: No gross focal neurologic deficit. Psychiatric: Normal mood and affect.   Labs on Admission: I have personally reviewed following labs and imaging studies  CBC: Recent Labs  Lab 09/07/19 0609 09/08/19 0619 09/13/19 1345  WBC 6.8 5.9 15.3*  NEUTROABS 4.9 3.9 13.8*  HGB 12.9 12.6 13.5  HCT 41.2 39.2 43.0  MCV 93.6 93.3 93.9  PLT 142* 145* 0000000   Basic Metabolic Panel: Recent Labs  Lab 09/07/19 0609 09/08/19 0619 09/13/19 1345  NA 132* 133* 130*  K 4.0 4.1 4.6  CL 91* 93* 88*  CO2 33* 31 35*  GLUCOSE 111* 147* 188*  BUN 27* 29* 21*  CREATININE 0.48 0.49 0.66  CALCIUM 8.7* 8.6* 8.6*   GFR: Estimated Creatinine Clearance: 79.5 mL/min (by C-G formula based on SCr of 0.66 mg/dL). Liver Function Tests: Recent Labs  Lab 09/07/19 0609 09/08/19 0619  AST 12* 19  ALT 10 21  ALKPHOS 53 54  BILITOT 0.4 0.5  PROT 6.9 6.5  ALBUMIN 3.4* 3.1*   No results for input(s): LIPASE, AMYLASE in the last 168 hours. No  results for input(s): AMMONIA in the last 168 hours. Coagulation Profile: No results for input(s): INR, PROTIME in the last 168 hours. Cardiac Enzymes: No results for input(s): CKTOTAL, CKMB, CKMBINDEX, TROPONINI in the last 168 hours. BNP (last 3 results) No results for input(s): PROBNP in the last 8760 hours. HbA1C: No results for input(s): HGBA1C in the last 72 hours. CBG: Recent Labs  Lab 09/07/19 1656 09/07/19 2055 09/07/19 2138 09/08/19 0925 09/08/19 1153  GLUCAP 144* 153* 168* 149* 106*   Lipid Profile: No results for input(s): CHOL, HDL, LDLCALC, TRIG, CHOLHDL, LDLDIRECT in the last 72 hours. Thyroid Function Tests: No results for input(s): TSH, T4TOTAL, FREET4, T3FREE, THYROIDAB in the last 72 hours. Anemia Panel: No results for input(s): VITAMINB12, FOLATE, FERRITIN, TIBC, IRON, RETICCTPCT in the last 72 hours. Urine analysis:    Component Value Date/Time   COLORURINE YELLOW 07/02/2019 1048   APPEARANCEUR CLEAR 07/02/2019 1048   APPEARANCEUR Clear 06/22/2012 0618   LABSPEC 1.012 07/02/2019 1048   LABSPEC 1.003 06/22/2012 0618   PHURINE 6.0 07/02/2019 1048   GLUCOSEU NEGATIVE 07/02/2019 1048   GLUCOSEU Negative 06/22/2012 0618   GLUCOSEU NEGATIVE 04/09/2011 1058   HGBUR NEGATIVE 07/02/2019 1048   BILIRUBINUR NEGATIVE 07/02/2019 1048   BILIRUBINUR Negative 06/22/2012 0618   KETONESUR NEGATIVE 07/02/2019 1048   PROTEINUR NEGATIVE 07/02/2019 1048   UROBILINOGEN 1.0 08/18/2011 1805   NITRITE NEGATIVE 07/02/2019 1048   LEUKOCYTESUR NEGATIVE 07/02/2019 1048   LEUKOCYTESUR Trace 06/22/2012 0618    Radiological Exams on Admission: DG Chest Port 1 View  Result Date: 09/13/2019 CLINICAL DATA:  Recent discharge from hospital, now on CPAP with increasing low sats and SOB EXAM:  PORTABLE CHEST 1 VIEW COMPARISON:  Chest radiograph 09/04/2019 FINDINGS: Stable cardiomediastinal contours with enlarged heart size. There are diffuse bilateral interstitial opacities. Hazy  opacities in the lung bases likely a combination of atelectasis and pleural effusion. No pneumothorax. No acute finding in the visualized skeleton. IMPRESSION: Diffuse bilateral interstitial opacities and small bilateral effusions. Findings most suspicious for pulmonary edema, infection not excluded. Electronically Signed   By: Audie Pinto M.D.   On: 09/13/2019 14:25    EKG: Independently reviewed.  Sinus rhythm  Assessment/Plan Principal Problem:   Acute on chronic respiratory failure with hypoxia and hypercapnia (HCC) Active Problems:   DM (diabetes mellitus), type 2 (HCC)   Schizoaffective disorder (HCC)   Essential hypertension   Schizoaffective disorder, bipolar type (HCC)   Obesity, Class III, BMI 40-49.9 (morbid obesity) ([place])   Acute on chronic respiratory failure with hypercapnia (HCC)   Leukocytosis    Acute on chronic hypercapneicRespiratory failure  Most likely secondary to acute diastolic dysfunction CHF Patient has a history of chronic respiratory failure and wears 2 L of oxygen continuous On 2 L of oxygen at rest she had a pulse oximetry of 84% and pulse oximetry has improved following BiPAP to 95 - 96% Patient had an arterial blood gas which showed uncompensated respiratory acidosis Repeat ABG in 2 hours Will attempt to wean patient off BiPAP once her acute exacerbation improves   Acute on chronic diastolic heart failure Last 2D echocardiogram shows an LVEF of 60 to 65%  Will place patient on Lasix 40 mg IV twice daily Continue metoprolol Maintain low sodium diet   Diabetes Mellitus Maintain consistent carbohydrate diet Glycemic control with sliding scale coverage   Hypertension Continue metoprolol and nicardipine Optimize blood pressure control   Schizoaffective disorder Continue Klonopin, Zyprexa, melatonin, Latuda   Chronic hyponatremia Serum sodium on admission is 130 Patient was recently admitted to [facility] in January.  There was  a concern raised about psychogenic polydipsia versus SIADH per nephrology Continue florinefand water restriction   GERD Continue PPI   Obesity, Morbid BMI 40 Complicates overall prognosis and care   Leukocytosis Related to recent steroid administration Patient is on dexamethasone and completes the course on 09/14/19 No evidence of infection at this time    COPD not in acute exacerbation Continue as needed bronchodilator therapy Continue inhaled steroids   DVT prophylaxis: Lovenox Code Status: Full  Family Communication: Plan of care was discussed with patient in detail. She  verbalizes understanding and agrees with the plan Disposition Plan: Back to previous home environment Consults called: Cardiology    Ciara Kagan MD [facility]     09/13/2019, 4:21 PM

## 2019-09-13 NOTE — ED Notes (Signed)
Pt to be restarted on bipap per admitting MD. Respiratory now at bedside.

## 2019-09-13 NOTE — Progress Notes (Signed)
Holding all PO meds at this time due to bi-pap and ABG levels. Per MD [place] change Decadron to IV form. Pharmacy called for dosing instructions and equivalency. Pharmacy says they will call back with instructions. VSS. Bi-pap intact.

## 2019-09-13 NOTE — ED Triage Notes (Signed)
Pt comes EMS from [place] with positive covid test 3/15 and moderna shot on 3/08. Pt comes on CPAP. 1/2 in nitro paste on chest from EMS. Pt was satting 84% on 2L chronic. Pt AOx4 and answering questions appropriately.

## 2019-09-13 NOTE — ED Provider Notes (Signed)
[facility] Emergency Department Provider Note       Time seen: ----------------------------------------- 1:55 PM on 09/13/2019 -----------------------------------------   I have reviewed the triage vital signs and the nursing notes.  HISTORY   Chief Complaint Shortness of Breath and Respiratory Distress    HPI Natalie Thomas is a 59 y.o. female with a history of anemia, anxiety, CHF, COPD, diabetes, schizoaffective disorder who presents to the ED for dyspnea. Reportedly she tested positive for Covid as an outpatient but was recently admitted here and tested negative. She comes in on CPAP satting only 84%. She is on 2 L nasal cannula oxygen chronically. Discomfort is 8 out of 10.  Past Medical History:  Diagnosis Date  . Anemia 10/16/2017  . Anxiety and depression   . CHF (congestive heart failure) ([place])   . Chronic hyponatremia 04/08/2007   Qualifier: Diagnosis of  By: Marca Ancona RMA, Lucy    . Closed comminuted intertrochanteric fracture of right femur ([place]) 11/07/2017  . COPD 04/08/2007   Qualifier: Diagnosis of  By: Marca Ancona RMA, Lucy    . Depression   . Diabetes mellitus, type 2 ([place])    pt denies but states that she has been treated for DM  . Essential hypertension 04/08/2007   Qualifier: Diagnosis of  By: Marca Ancona RMA, Lucy    . GERD (gastroesophageal reflux disease)   . Hyperlipidemia 10/07/2017  . Lumbar disc disease 04/09/2011  . MENOPAUSE, PREMATURE 04/08/2007   Qualifier: Diagnosis of  By: Marca Ancona RMA, Lucy    . 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 Osteoporosis 04/08/2007   Qualifier: Diagnosis of  By: Reatha Armour, Lucy    . Polycythemia secondary to smoking 04/09/2011  . 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 Schizoaffective disorder, bipolar type ([place]) 07/05/2011    Patient Active Problem List   Diagnosis Date Noted  . Obesity, Class III, BMI 40-49.9 (morbid obesity) ([place])   . Acute on  chronic respiratory failure with hypoxemia ([place]) 09/04/2019  . COVID-19 virus infection   . Acute on chronic diastolic CHF (congestive heart failure) ([place])   . 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 Nausea   . Acute gastritis   . Abdominal pain, chronic, epigastric   . Hyponatremia 07/09/2019  . Acute diastolic CHF (congestive heart failure) ([place]) 07/09/2019  . Ventral hernia 07/09/2019  . Hypertensive crisis 05/01/2019  . Acute abdominal pain 03/15/2019  . Pleural effusion 03/13/2019  . Atelectasis 03/13/2019  . Chronic diastolic CHF (congestive heart failure) ([place]) 03/13/2019  . Type 2 diabetes mellitus without complication ([place]) [id number]  . Urinary tract bacterial infections 03/09/2019  . Acute cystitis without hematuria   . Constipation   . Acute hypoxemic respiratory failure ([place]) 12/03/2018  . Irritability 11/09/2018  . Aggressive behavior 11/09/2018  . Agitation   . S/P right hip fracture 11/07/2017  . Closed comminuted intertrochanteric fracture of right femur ([place]) 11/07/2017  . Anxiety and depression   . Dyspnea 10/16/2017  . Anemia 10/16/2017  . COPD exacerbation ([place]) 10/07/2017  . Hyperlipidemia 10/07/2017  . Acute on chronic respiratory failure with hypoxia and hypercapnia ([place]) 10/07/2017  . Incontinence of urine 08/14/2011  . Schizoaffective disorder, bipolar type ([place]) 07/05/2011  . Cough 05/08/2011  . Lumbar disc disease 04/09/2011  . Polycythemia secondary to smoking 04/09/2011  . HIP PAIN, LEFT 12/12/2007  . 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 DM (diabetes mellitus), type 2 ([place]) 04/08/2007  . MENOPAUSE, PREMATURE 04/08/2007  .  Chronic hyponatremia 04/08/2007  . Schizoaffective disorder ([place]) 04/08/2007  . Essential hypertension 04/08/2007  . COPD (chronic obstructive pulmonary disease) ([place]) 04/08/2007  . GERD 04/08/2007  . Osteoporosis 04/08/2007    Past Surgical History:  Procedure Laterality Date  . BIOPSY  07/11/2019   Procedure: BIOPSY;   Surgeon: Jerene Bears, MD;  Location: [facility] ENDOSCOPY;  Service: Gastroenterology;;  . ESOPHAGOGASTRODUODENOSCOPY (EGD) WITH PROPOFOL N/A 07/11/2019   Procedure: ESOPHAGOGASTRODUODENOSCOPY (EGD) WITH PROPOFOL;  Surgeon: Jerene Bears, MD;  Location: [facility];  Service: Gastroenterology;  Laterality: N/A;  . EYE SURGERY     on left  eye, pt states that she sees bad out of the right eye and needs surgery there  . INTRAMEDULLARY (IM) NAIL INTERTROCHANTERIC Right 11/13/2017   Procedure: INTRAMEDULLARY (IM) NAIL INTERTROCHANTRIC;  Surgeon: Shona Needles, MD;  Location: [place];  Service: Orthopedics;  Laterality: Right;  . PELVIC FRACTURE SURGERY      Allergies Clarithromycin and Penicillins  Social History Social History   Tobacco Use  . Smoking status: Current Every Day Smoker    Packs/day: 0.25    Types: Cigarettes  . Smokeless tobacco: Never Used  Substance Use Topics  . Alcohol use: No  . Drug use: No    Review of Systems Constitutional: Negative for fever. Cardiovascular: Negative for chest pain. Respiratory: Positive for shortness of breath Gastrointestinal: Negative for abdominal pain, vomiting and diarrhea. Musculoskeletal: Negative for back pain. Skin: Negative for rash. Neurological: Negative for headaches, focal weakness or numbness.  All systems negative/normal/unremarkable except as stated in the HPI  ____________________________________________   PHYSICAL EXAM:  VITAL SIGNS: ED Triage Vitals  Enc Vitals Group     BP 09/13/19 1344 (!) 179/89     Pulse Rate 09/13/19 1342 89     Resp 09/13/19 1342 (!) 25     Temp 09/13/19 1346 (!) 97.4 F (36.3 C)     Temp Source 09/13/19 1346 Axillary     SpO2 09/13/19 1342 (!) 84 %     Weight 09/13/19 1342 207 lb (93.9 kg)     Height 09/13/19 1342 5' (1.524 m)     Head Circumference --      Peak Flow --      Pain Score 09/13/19 1342 8     Pain Loc --      Pain Edu? --      Excl. in [place]? --     Constitutional:  Alert and oriented. Moderate distress Eyes: Conjunctivae are normal. Normal extraocular movements. Cardiovascular: Normal rate, regular rhythm. No murmurs, rubs, or gallops. Respiratory: Tachypnea with rales and rhonchi bilaterally Gastrointestinal: Soft and nontender. Normal bowel sounds Musculoskeletal: Nontender with normal range of motion in extremities. Peripheral edema is noted Neurologic:  Normal speech and language. No gross focal neurologic deficits are appreciated.  Skin:  Skin is warm, dry and intact. No rash noted. Psychiatric: Mood and affect are normal. Speech and behavior are normal.  ____________________________________________  EKG: Interpreted by me. Sinus rhythm with rate of 75 bpm, PACs, normal axis, normal QT  ____________________________________________  ED COURSE:  As part of my medical decision making, I reviewed the following data within the [place] History obtained from family if available, nursing notes, old chart and ekg, as well as notes from prior ED visits. Patient presented for dyspnea, we will assess with labs and imaging as indicated at this time.   Procedures  MIKALEE BUNGARD was evaluated in Emergency Department on 09/13/2019 for the  symptoms described in the history of present illness. She was evaluated in the context of the global COVID-19 pandemic, which necessitated consideration that the patient might be at risk for infection with the SARS-CoV-2 virus that causes COVID-19. Institutional protocols and algorithms that pertain to the evaluation of patients at risk for COVID-19 are in a state of rapid change based on information released by regulatory bodies including the CDC and federal and state organizations. These policies and algorithms were followed during the patient's care in the ED.  ____________________________________________   LABS (pertinent positives/negatives)  Labs Reviewed  CBC WITH DIFFERENTIAL/PLATELET -  Abnormal; Notable for the following components:      Result Value   WBC 15.3 (*)    Neutro Abs 13.8 (*)    Abs Immature Granulocytes 0.08 (*)    All other components within normal limits  BASIC METABOLIC PANEL - Abnormal; Notable for the following components:   Sodium 130 (*)    Chloride 88 (*)    CO2 35 (*)    Glucose, Bld 188 (*)    BUN 21 (*)    Calcium 8.6 (*)    All other components within normal limits  BRAIN NATRIURETIC PEPTIDE - Abnormal; Notable for the following components:   B Natriuretic Peptide 1,107.0 (*)    All other components within normal limits  BLOOD GAS, VENOUS - Abnormal; Notable for the following components:   pCO2, Ven 82 (*)    pO2, Ven 61.0 (*)    Bicarbonate 37.7 (*)    Acid-Base Excess 7.6 (*)    All other components within normal limits  TROPONIN I (HIGH SENSITIVITY) - Abnormal; Notable for the following components:   Troponin I (High Sensitivity) 18 (*)    All other components within normal limits  CULTURE, BLOOD (ROUTINE X 2)  CULTURE, BLOOD (ROUTINE X 2)  LACTIC ACID, PLASMA   CRITICAL CARE Performed by: Laurence Aly   Total critical care time: 30 minutes  Critical care time was exclusive of separately billable procedures and treating other patients.  Critical care was necessary to treat or prevent imminent or life-threatening deterioration.  Critical care was time spent personally by me on the following activities: development of treatment plan with patient and/or surrogate as well as nursing, discussions with consultants, evaluation of patient's response to treatment, examination of patient, obtaining history from patient or surrogate, ordering and performing treatments and interventions, ordering and review of laboratory studies, ordering and review of radiographic studies, pulse oximetry and re-evaluation of patient's condition.  RADIOLOGY Images were viewed by me  Chest x-ray IMPRESSION:  Diffuse bilateral interstitial  opacities and small bilateral  effusions. Findings most suspicious for pulmonary edema, infection  not excluded.  ____________________________________________   DIFFERENTIAL DIAGNOSIS   CHF, COPD, pneumonia, COVID-19, dehydration, electrolyte abnormality, sepsis  FINAL ASSESSMENT AND PLAN  Dyspnea, hypoxia   Plan: The patient had presented for dyspnea and hypoxia on CPAP. We subsequently changed over to BiPAP which she tolerated much more easily. Patient's labs are nonspecific and likely reflects multiple etiologies including COPD and CHF. Patient's imaging again is nonspecific, resemble CHF, COPD and COVID-19.  I have ordered broad-spectrum antibiotics for her.  She remains on BiPAP.  She has already completed courses of steroids and remdesivir.  I will discuss with the hospitalist for admission.   Laurence Aly, MD    Note: This note was generated in part or whole with voice recognition software. Voice recognition is usually quite accurate but there are transcription  errors that can and very often do occur. I apologize for any typographical errors that were not detected and corrected.     Earleen Newport, MD 09/13/19 [phone]

## 2019-09-14 ENCOUNTER — Inpatient Hospital Stay
Admit: 2019-09-14 | Discharge: 2019-09-14 | Disposition: A | Discharge status: Home / Self Care | Payer: Medicare Other | Attending: Nurse Practitioner | Admitting: Nurse Practitioner

## 2019-09-14 ENCOUNTER — Telehealth: Payer: Self-pay | Admitting: Family

## 2019-09-14 DIAGNOSIS — R001 Bradycardia, unspecified: Secondary | ICD-10-CM

## 2019-09-14 LAB — GLUCOSE, CAPILLARY
Glucose-Capillary: 145 mg/dL — ABNORMAL HIGH (ref 70–99)
Glucose-Capillary: 124 mg/dL — ABNORMAL HIGH (ref 70–99)
Glucose-Capillary: 108 mg/dL — ABNORMAL HIGH (ref 70–99)
Glucose-Capillary: 206 mg/dL — ABNORMAL HIGH (ref 70–99)
Glucose-Capillary: 129 mg/dL — ABNORMAL HIGH (ref 70–99)

## 2019-09-14 LAB — CBC
HCT: 36.9 % (ref 36.0–46.0)
Hemoglobin: 11.6 g/dL — ABNORMAL LOW (ref 12.0–15.0)
MCH: 29.2 pg (ref 26.0–34.0)
MCHC: 31.4 g/dL (ref 30.0–36.0)
MCV: 92.9 fL (ref 80.0–100.0)
Platelets: 144 10*3/uL — ABNORMAL LOW (ref 150–400)
RBC: 3.97 MIL/uL (ref 3.87–5.11)
RDW: 13.5 % (ref 11.5–15.5)
WBC: 7.8 10*3/uL (ref 4.0–10.5)
nRBC: 0 % (ref 0.0–0.2)

## 2019-09-14 LAB — BASIC METABOLIC PANEL
Anion gap: 7 (ref 5–15)
Anion gap: 11 (ref 5–15)
BUN: 20 mg/dL (ref 6–20)
BUN: 20 mg/dL (ref 6–20)
CO2: 38 mmol/L — ABNORMAL HIGH (ref 22–32)
CO2: 36 mmol/L — ABNORMAL HIGH (ref 22–32)
Calcium: 8.2 mg/dL — ABNORMAL LOW (ref 8.9–10.3)
Calcium: 8.2 mg/dL — ABNORMAL LOW (ref 8.9–10.3)
Chloride: 90 mmol/L — ABNORMAL LOW (ref 98–111)
Chloride: 83 mmol/L — ABNORMAL LOW (ref 98–111)
Creatinine, Ser: 0.5 mg/dL (ref 0.44–1.00)
Creatinine, Ser: 0.5 mg/dL (ref 0.44–1.00)
GFR calc Af Amer: >60 mL/min (ref >60)
GFR calc Af Amer: >60 mL/min (ref >60)
GFR calc non Af Amer: >60 mL/min (ref >60)
GFR calc non Af Amer: >60 mL/min (ref >60)
Glucose, Bld: 153 mg/dL — ABNORMAL HIGH (ref 70–99)
Glucose, Bld: 119 mg/dL — ABNORMAL HIGH (ref 70–99)
Potassium: 4.4 mmol/L (ref 3.5–5.1)
Potassium: 4.2 mmol/L (ref 3.5–5.1)
Sodium: 135 mmol/L (ref 135–145)
Sodium: 130 mmol/L — ABNORMAL LOW (ref 135–145)

## 2019-09-14 LAB — PROCALCITONIN: Procalcitonin: <0.1 ng/mL

## 2019-09-14 MED ORDER — DEXAMETHASONE SODIUM PHOSPHATE 10 MG/ML IJ SOLN
6.0000 mg | INTRAMUSCULAR | Status: DC
  Administered 2019-09-14: 6 mg via INTRAVENOUS
  Filled 2019-09-14 (×2): qty 0.6

## 2019-09-14 MED ORDER — FUROSEMIDE 10 MG/ML IJ SOLN
40.0000 mg | Freq: Two times a day (BID) | INTRAMUSCULAR | Status: DC
  Administered 2019-09-14: 40 mg via INTRAVENOUS
  Filled 2019-09-14: qty 4

## 2019-09-14 MED ORDER — LORAZEPAM 2 MG/ML IJ SOLN
1.0000 mg | Freq: Four times a day (QID) | INTRAMUSCULAR | Status: DC | PRN
  Administered 2019-09-14 – 2019-09-15 (×6): 1 mg via INTRAVENOUS
  Filled 2019-09-14 (×7): qty 1

## 2019-09-14 MED ORDER — NIFEDIPINE ER OSMOTIC RELEASE 90 MG PO TB24
90.0000 mg | ORAL_TABLET | Freq: Every day | ORAL | Status: DC
  Filled 2019-09-14 (×3): qty 1

## 2019-09-14 MED ORDER — MORPHINE SULFATE (PF) 2 MG/ML IV SOLN
INTRAVENOUS | Status: AC
  Administered 2019-09-14: 2 mg via INTRAVENOUS
  Filled 2019-09-14: qty 1

## 2019-09-14 MED ORDER — MORPHINE SULFATE (PF) 2 MG/ML IV SOLN
1.0000 mg | INTRAVENOUS | Status: DC | PRN
  Administered 2019-09-15 (×3): 2 mg via INTRAVENOUS
  Filled 2019-09-14 (×3): qty 1

## 2019-09-14 MED ORDER — FUROSEMIDE 10 MG/ML IJ SOLN
40.0000 mg | Freq: Every day | INTRAMUSCULAR | Status: DC
  Administered 2019-09-15 – 2019-09-16 (×2): 40 mg via INTRAVENOUS
  Filled 2019-09-14 (×2): qty 4

## 2019-09-14 MED ORDER — MUPIROCIN 2 % EX OINT
1.0000 "application " | TOPICAL_OINTMENT | Freq: Two times a day (BID) | CUTANEOUS | Status: DC
  Administered 2019-09-14 – 2019-09-16 (×4): 1 via NASAL
  Filled 2019-09-14: qty 22

## 2019-09-14 NOTE — Consult Note (Addendum)
Natalie Thomas is a 59 y.o. female  [id number]  Primary Cardiologist: Neoma Laming  Reason for Consultation: CHF Exacerbation  HPI: The patient is a 59 year old Caucasian female with a past medical history of obesity COPD on 2 L nasal cannula at home, HFpEF, hypertension, type 2 diabetes, and schizoaffective disorder.  The patient has also been recently tested positive for Covid 19 and recently discharged from the hospital on 09/08/2019 due to COVID-19 pneumonia.  EMS was called for the patient and brought to the hospital due to being hypoxic on 2 L nasal cannula.  Patient had to be placed on BiPAP in the ER and transferred to the ICU.  At that time the patient denied any chest pain.  The patient has remained dyspneic and has continued to require the BiPAP for comfort.   Review of Systems: Denies chest pain.  Patient reports shortness of breath.   Past Medical History:  Diagnosis Date   Anemia 10/16/2017   Anxiety and depression    CHF (congestive heart failure) (HCC)    Chronic hyponatremia 04/08/2007   Qualifier: Diagnosis of  By: Marca Ancona RMA, Lucy     Closed comminuted intertrochanteric fracture of right femur ([place]) 11/07/2017   COPD 04/08/2007   Qualifier: Diagnosis of  By: Marca Ancona RMA, Lucy     Depression    Diabetes mellitus, type 2 ([place])    pt denies but states that she has been treated for DM   Essential hypertension 04/08/2007   Qualifier: Diagnosis of  By: Marca Ancona RMA, Lucy     GERD (gastroesophageal reflux disease)    Hyperlipidemia 10/07/2017   Lumbar disc disease 04/09/2011   MENOPAUSE, PREMATURE 04/08/2007   Qualifier: Diagnosis of  By: Marca Ancona RMA, Lucy     NEOPLASM, MALIGNANT, VULVA 04/08/2007   Qualifier: Diagnosis of  By: Marca Ancona RMA, Lucy     Osteoporosis 04/08/2007   Qualifier: Diagnosis of  By: Marca Ancona RMA, Lucy     Polycythemia secondary to smoking 04/09/2011   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 Not to exceed 3g/24h of tylenol from all sources.     albuterol (PROVENTIL) (2.5 MG/3ML) 0.083% nebulizer solution Take 3 mLs (2.5 mg total) by nebulization every 4 (four) hours as needed for wheezing or shortness of breath. 75 mL 12   albuterol (VENTOLIN HFA) 108 (90 Base) MCG/ACT inhaler Inhale 2 puffs into the lungs every 6 (six) hours as needed for wheezing or shortness of breath.     Alum & Mag Hydroxide-Simeth (ANTACID LIQUID PO) Take 30 mLs by mouth every 6 (six) hours as needed (heartburn).     buPROPion (WELLBUTRIN XL) 150 MG 24 hr tablet Take 150 mg by mouth daily.     Calcium Carb-Cholecalciferol (CALCIUM 600+D) 600-800 MG-UNIT TABS Take 1 tablet by mouth daily.     clonazePAM (KLONOPIN) 1 MG tablet Take 1 tablet (1 mg total) by mouth 2 (two) times daily. 60 tablet 0   dexamethasone (DECADRON) 6 MG tablet Take 1 tablet (6 mg total) by mouth daily for 5 days. 5 tablet 0   docusate sodium (COLACE) 100 MG capsule Take 100 mg by mouth 2 (two) times daily.      feeding supplement, ENSURE ENLIVE, (ENSURE ENLIVE) LIQD Take 237 mLs by  mouth daily. 237 mL 12   fludrocortisone (FLORINEF) 0.1 MG tablet Take 1 tablet (0.1 mg total) by mouth daily. 60 tablet 1   fluticasone (FLONASE) 50 MCG/ACT nasal spray Place 2 sprays into both nostrils 2 (two) times daily.     fluticasone furoate-vilanterol (BREO ELLIPTA) 100-25 MCG/INH AEPB Inhale 1 puff into the lungs daily.     furosemide (LASIX) 20 MG tablet Take 20 mg by mouth daily.      guaiFENesin (MUCINEX) 600 MG 12 hr tablet Take 1,200 mg by mouth daily.     hydrOXYzine (ATARAX/VISTARIL) 25 MG tablet Take 25 mg by mouth every 6 (six) hours as needed for anxiety or itching.     ipratropium-albuterol (DUONEB) 0.5-2.5 (3) MG/3ML SOLN Take 3 mLs by nebulization 3  (three) times daily.     loperamide (IMODIUM) 2 MG capsule Take 2 mg by mouth every 6 (six) hours as needed for diarrhea or loose stools.      loratadine (CLARITIN) 10 MG tablet Take 10 mg by mouth daily.     lurasidone (LATUDA) 20 MG TABS tablet Take 1 tablet (20 mg total) by mouth 2 (two) times daily.     Melatonin 5 MG TABS Take 5 mg by mouth at bedtime.     metoprolol tartrate (LOPRESSOR) 25 MG tablet Take 1 tablet (25 mg total) by mouth 2 (two) times daily. 60 tablet 0   Multiple Vitamin (MULTIVITAMIN WITH MINERALS) TABS tablet Take 1 tablet by mouth daily.     neomycin-bacitracin-polymyxin (NEOSPORIN) ointment Apply 1 application topically as needed for wound care.     nicotine (NICODERM CQ - DOSED IN MG/24 HOURS) 21 mg/24hr patch Place 21 mg onto the skin daily.      NIFEdipine (PROCARDIA XL/ADALAT-CC) 90 MG 24 hr tablet Take 90 mg by mouth daily.     nystatin (NYSTATIN) powder Apply 1 application topically 2 (two) times daily as needed (irritation). Apply topically to reddened area twice daily as needed for irritation     OLANZapine (ZYPREXA) 10 MG tablet Take 10 mg by mouth 2 (two) times daily.     ondansetron (ZOFRAN) 8 MG tablet Take 8 mg by mouth every 6 (six) hours as needed for nausea or vomiting.     pantoprazole (PROTONIX) 40 MG tablet Take 1 tablet (40 mg total) by mouth daily. 90 tablet 1   polyethylene glycol (MIRALAX) 17 g packet Take 17 g by mouth daily. (Patient taking differently: Take 17 g by mouth daily. Mix 1 capful (17 g) in 4-8 oz of water or juice and give by mouth once daily for bowel regimen) 14 each 0   sodium chloride (OCEAN) 0.65 % SOLN nasal spray Place 2 sprays into both nostrils every 2 (two) hours as needed (dryness, irritation).     sucralfate (CARAFATE) 1 GM/10ML suspension Take 10 mLs (1 g total) by mouth 4 (four) times daily -  with meals and at bedtime. 420 mL 1   valproic acid (DEPAKENE) 250 MG capsule Take 2 capsules (500 mg total) by mouth 3 (three)  times daily. 30 capsule 0      buPROPion  150 mg Oral Daily   calcium carbonate  400 mg of elemental calcium Oral Daily   And   cholecalciferol  800 Units Oral Daily   Chlorhexidine Gluconate Cloth  6 each Topical Daily   clonazePAM  1 mg Oral BID   dexamethasone (DECADRON) injection  6 mg Intravenous Q24H   docusate sodium  100 mg Oral  BID   enoxaparin (LOVENOX) injection  40 mg Subcutaneous Q24H   feeding supplement (ENSURE ENLIVE)  237 mL Oral Q24H   fludrocortisone  0.1 mg Oral Daily   fluticasone  2 spray Each Nare BID   fluticasone furoate-vilanterol  1 puff Inhalation Daily   [START ON 09/15/2019] furosemide  40 mg Intravenous Daily   guaiFENesin  1,200 mg Oral Daily   insulin aspart  0-20 Units Subcutaneous TID WC   insulin aspart  6 Units Subcutaneous TID WC   Ipratropium-Albuterol  1 puff Inhalation TID   loratadine  10 mg Oral Daily   lurasidone  20 mg Oral BID   Melatonin  5 mg Oral QHS   metoprolol tartrate  25 mg Oral BID   multivitamin with minerals  1 tablet Oral Daily   mupirocin ointment  1 application Nasal BID   nicotine  21 mg Transdermal Daily   NIFEdipine  90 mg Oral Daily   OLANZapine  10 mg Oral BID   pantoprazole  40 mg Oral Daily   polyethylene glycol  17 g Oral Daily   sodium chloride flush  3 mL Intravenous Q12H   sucralfate  1 g Oral TID WC & HS   valproic acid  500 mg Oral TID    Infusions:  sodium chloride      Allergies  Allergen Reactions   Clarithromycin Itching   Penicillins Itching    Has tolerated cefazolin before  Has patient had a PCN reaction causing immediate rash, facial/tongue/throat swelling, SOB or lightheadedness with hypotension: No Has patient had a PCN reaction causing severe rash involving mucus membranes or skin necrosis: No Has patient had a PCN reaction that required hospitalization: Unknown Has patient had a PCN reaction occurring within the last 10 years: No If all of the above answers are "NO", then may  proceed with Cephalosporin use.     Social History   Socioeconomic History   Marital status: Divorced    Spouse name: Not on file   Number of children: Not on file   Years of education: Not on file   Highest education level: Not on file  Occupational History   Occupation: Disabled  Tobacco Use   Smoking status: Current Every Day Smoker    Packs/day: 0.25    Types: Cigarettes   Smokeless tobacco: Never Used  Substance and Sexual Activity   Alcohol use: No   Drug use: No   Sexual activity: Never  Other Topics Concern   Not on file  Social History Narrative   single   Social Determinants of Health   Financial Resource Strain:    Difficulty of Paying Living Expenses:   Food Insecurity:    Worried About Charity fundraiser in the Last Year:    Arboriculturist in the Last Year:   Transportation Needs:    Film/video editor (Medical):    Lack of Transportation (Non-Medical):   Physical Activity:    Days of Exercise per Week:    Minutes of Exercise per Session:   Stress:    Feeling of Stress :   Social Connections:    Frequency of Communication with Friends and Family:    Frequency of Social Gatherings with Friends and Family:    Attends Religious Services:    Active Member of Clubs or Organizations:    Attends Archivist Meetings:    Marital Status:   Intimate Partner Violence:    Fear of Current or Ex-Partner:  Emotionally Abused:    Physically Abused:    Sexually Abused:     History reviewed. No pertinent family history.  PHYSICAL EXAM: Vitals:   09/14/19 0900 09/14/19 1000  BP: 104/81 (!) 110/59  Pulse: (!) 50 (!) 44  Resp: 18 16  Temp: (!) 97.5 F (36.4 C)   SpO2: 98% 98%     Intake/Output Summary (Last 24 hours) at 09/14/2019 1250 Last data filed at 09/14/2019 1005 Gross per 24 hour  Intake 237.26 ml  Output 2900 ml  Net -2662.74 ml    General:  Well appearing. No respiratory difficulty HEENT: normal Neck: supple. no JVD.  Carotids 2+ bilat; no bruits. No lymphadenopathy or thryomegaly appreciated. Cor: PMI nondisplaced. Bradycardic. No rubs, gallops or murmurs.  Lungs: crackles bilateral lower lobes. Abdomen: soft, nontender, nondistended. No hepatosplenomegaly. No bruits or masses. Good bowel sounds. Extremities: no cyanosis, clubbing, rash, edema Neuro: alert & oriented x 3, cranial nerves grossly intact. moves all 4 extremities w/o difficulty. Affect pleasant.  ECG: NSR. HR 75 with mildly peaked T waves  Results for orders placed or performed during the hospital encounter of 09/13/19 (from the past 24 hour(s))  CBC with Differential     Status: Abnormal   Collection Time: 09/13/19  1:45 PM  Result Value Ref Range   WBC 15.3 (H) 4.0 - 10.5 K/uL   RBC 4.58 3.87 - 5.11 MIL/uL   Hemoglobin 13.5 12.0 - 15.0 g/dL   HCT 43.0 36.0 - 46.0 %   MCV 93.9 80.0 - 100.0 fL   MCH 29.5 26.0 - 34.0 pg   MCHC 31.4 30.0 - 36.0 g/dL   RDW 13.7 11.5 - 15.5 %   Platelets 181 150 - 400 K/uL   nRBC 0.0 0.0 - 0.2 %   Neutrophils Relative % 90 %   Neutro Abs 13.8 (H) 1.7 - 7.7 K/uL   Lymphocytes Relative 6 %   Lymphs Abs 0.9 0.7 - 4.0 K/uL   Monocytes Relative 3 %   Monocytes Absolute 0.4 0.1 - 1.0 K/uL   Eosinophils Relative 0 %   Eosinophils Absolute 0.0 0.0 - 0.5 K/uL   Basophils Relative 0 %   Basophils Absolute 0.1 0.0 - 0.1 K/uL   Immature Granulocytes 1 %   Abs Immature Granulocytes 0.08 (H) 0.00 - 0.07 K/uL  Basic metabolic panel     Status: Abnormal   Collection Time: 09/13/19  1:45 PM  Result Value Ref Range   Sodium 130 (L) 135 - 145 mmol/L   Potassium 4.6 3.5 - 5.1 mmol/L   Chloride 88 (L) 98 - 111 mmol/L   CO2 35 (H) 22 - 32 mmol/L   Glucose, Bld 188 (H) 70 - 99 mg/dL   BUN 21 (H) 6 - 20 mg/dL   Creatinine, Ser 0.66 0.44 - 1.00 mg/dL   Calcium 8.6 (L) 8.9 - 10.3 mg/dL   GFR calc non Af Amer >60 >60 mL/min   GFR calc Af Amer >60 >60 mL/min   Anion gap 7 5 - 15  Brain natriuretic peptide      Status: Abnormal   Collection Time: 09/13/19  1:45 PM  Result Value Ref Range   B Natriuretic Peptide 1,107.0 (H) 0.0 - 100.0 pg/mL  Troponin I (High Sensitivity)     Status: Abnormal   Collection Time: 09/13/19  1:45 PM  Result Value Ref Range   Troponin I (High Sensitivity) 18 (H) <18 ng/L  Blood culture (routine x 2)     Status:  None (Preliminary result)   Collection Time: 09/13/19  1:45 PM   Specimen: BLOOD  Result Value Ref Range   Specimen Description BLOOD BLOOD RIGHT FOREARM    Special Requests      BOTTLES DRAWN AEROBIC AND ANAEROBIC Blood Culture adequate volume   Culture      NO GROWTH < 24 HOURS Performed at [facility], [address] [place], [place] [address]    Report Status PENDING   Lactic acid, plasma     Status: None   Collection Time: 09/13/19  1:45 PM  Result Value Ref Range   Lactic Acid, Venous 1.4 0.5 - 1.9 mmol/L  Blood gas, venous     Status: Abnormal   Collection Time: 09/13/19  1:49 PM  Result Value Ref Range   FIO2 0.50    Delivery systems BILEVEL POSITIVE AIRWAY PRESSURE    pH, Ven 7.27 7.250 - 7.430   pCO2, Ven 82 (HH) 44.0 - 60.0 mmHg   pO2, Ven 61.0 (H) 32.0 - 45.0 mmHg   Bicarbonate 37.7 (H) 20.0 - 28.0 mmol/L   Acid-Base Excess 7.6 (H) 0.0 - 2.0 mmol/L   O2 Saturation 87.3 %   Patient temperature 37.0    Collection site VENOUS    Sample type VENOUS   Blood culture (routine x 2)     Status: None (Preliminary result)   Collection Time: 09/13/19  1:53 PM   Specimen: BLOOD  Result Value Ref Range   Specimen Description BLOOD BLOOD LEFT HAND    Special Requests      BOTTLES DRAWN AEROBIC AND ANAEROBIC Blood Culture results may not be optimal due to an inadequate volume of blood received in culture bottles   Culture      NO GROWTH < 24 HOURS Performed at [facility], [address] [place], [place] [address]    Report Status PENDING   Respiratory Panel by RT PCR (Flu A&B, Covid) - Nasopharyngeal Swab     Status:  None   Collection Time: 09/13/19  4:35 PM   Specimen: Nasopharyngeal Swab  Result Value Ref Range   SARS Coronavirus 2 by RT PCR NEGATIVE NEGATIVE   Influenza A by PCR NEGATIVE NEGATIVE   Influenza B by PCR NEGATIVE NEGATIVE  Troponin I (High Sensitivity)     Status: Abnormal   Collection Time: 09/13/19  4:40 PM  Result Value Ref Range   Troponin I (High Sensitivity) 19 (H) <18 ng/L  Blood gas, arterial     Status: Abnormal   Collection Time: 09/13/19  6:00 PM  Result Value Ref Range   FIO2 50.00    Delivery systems BILEVEL POSITIVE AIRWAY PRESSURE    Inspiratory PAP 10    Expiratory PAP 5    pH, Arterial 7.37 7.350 - 7.450   pCO2 arterial 70 (HH) 32.0 - 48.0 mmHg   pO2, Arterial 92 83.0 - 108.0 mmHg   Bicarbonate 40.5 (H) 20.0 - 28.0 mmol/L   Acid-Base Excess 12.1 (H) 0.0 - 2.0 mmol/L   O2 Saturation 96.9 %   Patient temperature 37.0    Collection site LEFT RADIAL    Sample type ARTHROGRAPHIS SPECIES    Allens test (pass/fail) POSITIVE (A) PASS  Glucose, capillary     Status: Abnormal   Collection Time: 09/13/19  6:12 PM  Result Value Ref Range   Glucose-Capillary 145 (H) 70 - 99 mg/dL  Glucose, capillary     Status: Abnormal   Collection Time: 09/13/19  9:56 PM  Result  Value Ref Range   Glucose-Capillary 180 (H) 70 - 99 mg/dL  Basic metabolic panel     Status: Abnormal   Collection Time: 09/14/19  3:50 AM  Result Value Ref Range   Sodium 135 135 - 145 mmol/L   Potassium 4.4 3.5 - 5.1 mmol/L   Chloride 90 (L) 98 - 111 mmol/L   CO2 38 (H) 22 - 32 mmol/L   Glucose, Bld 153 (H) 70 - 99 mg/dL   BUN 20 6 - 20 mg/dL   Creatinine, Ser 0.50 0.44 - 1.00 mg/dL   Calcium 8.2 (L) 8.9 - 10.3 mg/dL   GFR calc non Af Amer >60 >60 mL/min   GFR calc Af Amer >60 >60 mL/min   Anion gap 7 5 - 15  CBC     Status: Abnormal   Collection Time: 09/14/19  3:50 AM  Result Value Ref Range   WBC 7.8 4.0 - 10.5 K/uL   RBC 3.97 3.87 - 5.11 MIL/uL   Hemoglobin 11.6 (L) 12.0 - 15.0 g/dL   HCT  36.9 36.0 - 46.0 %   MCV 92.9 80.0 - 100.0 fL   MCH 29.2 26.0 - 34.0 pg   MCHC 31.4 30.0 - 36.0 g/dL   RDW 13.5 11.5 - 15.5 %   Platelets 144 (L) 150 - 400 K/uL   nRBC 0.0 0.0 - 0.2 %  Procalcitonin - Baseline     Status: None   Collection Time: 09/14/19  6:50 AM  Result Value Ref Range   Procalcitonin <0.10 ng/mL  Glucose, capillary     Status: Abnormal   Collection Time: 09/14/19  7:22 AM  Result Value Ref Range   Glucose-Capillary 124 (H) 70 - 99 mg/dL  Glucose, capillary     Status: Abnormal   Collection Time: 09/14/19 12:16 PM  Result Value Ref Range   Glucose-Capillary 108 (H) 70 - 99 mg/dL   CT ANGIO CHEST PE W OR WO CONTRAST  Result Date: 09/13/2019 CLINICAL DATA:  PE suspected. Positive COVID-19 test. EXAM: CT ANGIOGRAPHY CHEST WITH CONTRAST TECHNIQUE: Multidetector CT imaging of the chest was performed using the standard protocol during bolus administration of intravenous contrast. Multiplanar CT image reconstructions and MIPs were obtained to evaluate the vascular anatomy. CONTRAST:  10mL OMNIPAQUE IOHEXOL 350 MG/ML SOLN COMPARISON:  CT PE study dated May 14, 2018. FINDINGS: Cardiovascular: Contrast injection is sufficient to demonstrate satisfactory opacification of the pulmonary arteries to the segmental level. There is no pulmonary embolus. The main pulmonary artery is dilated measuring approximately 3.4 cm. There is no CT evidence of acute right heart strain. There are atherosclerotic changes of the visualized thoracic aorta without evidence for an aneurysm. There is extensive calcified atherosclerotic disease at the origin of the left subclavian artery. This likely results in high-grade stenosis or near occlusion and can contribute to steal syndrome in the appropriate clinical setting. The heart size is significantly enlarged. There is no pericardial effusion. Coronary artery calcifications are noted. The left atrium is significantly enlarged. Mediastinum/Nodes: --No  mediastinal or hilar lymphadenopathy. --No axillary lymphadenopathy. --No supraclavicular lymphadenopathy. --Normal thyroid gland. --The esophagus is unremarkable Lungs/Pleura: There are moderate to large bilateral pleural effusions. Bibasilar compressive atelectasis is noted. There is interlobular septal thickening bilaterally. There is no large focal infiltrate. Emphysematous changes are noted bilaterally. The trachea is unremarkable. Upper Abdomen: Again noted is extensive atherosclerotic disease involving the abdominal aorta at the level of the celiac axis. 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 Musculoskeletal: Multilevel chronic appearing compression fractures are noted throughout the visualized thoracolumbar spine. These all appear to be essentially stable since 2019. Review of the MIP images confirms the above findings. IMPRESSION: 1. No evidence for acute pulmonary embolus. 2. Moderate to large bilateral pleural effusions with associated bibasilar compressive atelectasis. 3. Interlobular septal thickening, compatible with pulmonary edema. 4. Cardiomegaly. 5. 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 6. Dilated main pulmonary artery which can be seen in patients with elevated pulmonary artery pressures. 7. Multiple additional chronic findings as detailed above. 8. Aortic Atherosclerosis (ICD10-I70.0) and Emphysema (ICD10-J43.9). Electronically Signed   By: Constance Holster M.D.   On: 09/13/2019 16:21   DG Chest Port 1 View  Result Date: 09/13/2019 CLINICAL DATA:  Recent discharge from hospital, now on CPAP with increasing low sats and SOB EXAM: PORTABLE CHEST 1 VIEW COMPARISON:  Chest radiograph 09/04/2019 FINDINGS: Stable cardiomediastinal contours with enlarged heart size. There are diffuse bilateral  interstitial opacities. Hazy opacities in the lung bases likely a combination of atelectasis and pleural effusion. No pneumothorax. No acute finding in the visualized skeleton. IMPRESSION: Diffuse bilateral interstitial opacities and small bilateral effusions. Findings most suspicious for pulmonary edema, infection not excluded. Electronically Signed   By: Audie Pinto M.D.   On: 09/13/2019 14:25     ASSESSMENT AND PLAN: On assessment the patient continues to require the BiPAP. Most recent ABG did show her respiratory acidosis had resolved. Shortness of breath still most likely from her HFpEF exacerbation vs COVID.  Patient denies chest pain.  Last echocardiogram showed an LVEF of 60 to 65%. We will order an echocardiogram to determine the patient's HFpEF status.   Patient is currently bradycardic in the 40s but asymptomatic.  Please hold beta-blockers if the patient rate is less than 60.  Please continue IV Lasix 40 mg daily.  Goal is to keep the patient net negative each day. If not net negative, consider re-dosing her this afternoon. BP is currently well controlled, please continue nifedipine 90mg  Daily. Will continue to monitor and f/u after the echo. Agree.  Adaline Sill NP-C

## 2019-09-14 NOTE — Progress Notes (Signed)
PROGRESS NOTE    Natalie Thomas  [id number] DOB: [birth year] DOA: 09/13/2019 PCP: System, Pcp Not In   Brief Narrative:  Natalie Thomas is a 59 y.o. 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 She was brought into the ER by EMS and was said to have a pulse oximetry of 84% on her home 2 liters of oxygen.  She arrived on CPAP and had to be placed on a BiPAP, thought to be more due to CHF exacerbation.  Recent COVID-19 infection can be another factor.  Elevated BNP and chest x-ray with diffuse bilateral interstitial opacities and small bilateral effusions suspicious for pulmonary edema, infection not excluded.  Subjective: Experience shortness of breath whenever BiPAP stops.  She was seen on BiPAP. She was also asking for food.  Assessment & Plan:   Principal Problem:   Acute on chronic respiratory failure with hypoxia and hypercapnia (HCC) Active Problems:   DM (diabetes mellitus), type 2 (HCC)   Schizoaffective disorder (HCC)   Essential hypertension   Schizoaffective disorder, bipolar type (HCC)   Obesity, Class III, BMI 40-49.9 (morbid obesity) ([place])   Acute on chronic respiratory failure with hypercapnia (HCC)   Leukocytosis  Acute on chronic hypercapneicRespiratory failure.  Most likely secondary to acute on chronic diastolic heart failure.  Post COVID-19 pneumonitis can be a factor.  Continue to require BiPAP.  No wheezing with few basal crackles. Repeat ABG with resolution of respiratory  acidosis. -Continue Decadron 6 mg daily for possible post Covid pneumonitis. -We will try weaning her off from BiPAP. -Continue supplemental oxygen to keep saturation above 90%.  Acute on chronic diastolic heart failure Last 2D echocardiogram shows an LVEF of 60 to 65%. Repeat echocardiogram done-pending results. Lasix 40 mg twice daily which will be decreased to 40 mg daily. -Discontinue metoprolol due to bradycardia. -Cardiology is following-appreciate their recommendations.  Bradycardia.  Patient remained asymptomatic.  Persistent bradycardia with heart rate in 40s to low 50s. -Discontinue metoprolol and observe.  Hypertension.  Currently blood pressure is little soft. -Holding metoprolol for bradycardia. -Hold nicardipine for systolic below [id number].  Diabetes.  Well-controlled with A1c of 5.  CBG elevated as patient is on steroid. -Continue with 6 units of NovoLog with meals and resistant SSI. -Can add long-acting if needed.  Schizoaffective disorder Continue Klonopin, Zyprexa, melatonin, Latuda  Chronic hyponatremia Serum sodium  130 Patient was recently admitted to [facility] in [facility].  There was a concern raised about psychogenic polydipsia versus SIADH per nephrology Continue florinefand water restriction  GERD Continue PPI  Obesity, Morbid BMI 40 Complicates overall prognosis and care   Leukocytosis.  Resolved today. Related to recent steroid administration.  No evidence of infection at this time, PCT less than 0.10.  COPD not in acute exacerbation Continue as needed bronchodilator therapy Continue inhaled steroids   Objective: Vitals:   09/14/19 1200 09/14/19 1300 09/14/19 1400 09/14/19 1405  BP: (!) 125/100 (!) 124/93 (!) 80/58 (!) 92/52  Pulse: (!) 59 63 (!) 54 (!) 55  Resp: 18 (!) 21 18 (!) 22  Temp: 97.8 F (36.6 C)  TempSrc:      SpO2: 94% 97% 95% 96%  Weight:      Height:        Intake/Output Summary (Last 24 hours) at 09/14/2019  1550 Last data filed at 09/14/2019 1005 Gross per 24 hour  Intake 237.26 ml  Output 2900 ml  Net -2662.74 ml   Filed Weights   09/13/19 1342  Weight: 93.9 kg    Examination:  General exam: Alert, obese lady, appears calm and comfortable with BiPAP. Respiratory system: Few basal crackles, respiratory effort normal. Cardiovascular system: S1 & S2 heard, RRR. No JVD, murmurs, rubs, gallops or clicks. Gastrointestinal system: Soft, nontender, nondistended, bowel sounds positive. Central nervous system: Alert and oriented. No focal neurological deficits.Symmetric 5 x 5 power. Extremities: Trace LE edema, no cyanosis, pulses intact and symmetrical. Skin: No rashes, lesions or ulcers Psychiatry: Judgement and insight appear normal.   DVT prophylaxis: Lovenox. Code Status: Full. Family Communication: Patient was updated. Disposition Plan: Pending improvement patient is currently on BiPAP.  Needs weaning of from BiPAP for discharge.  Will go back home.  Consultants:   Cardiology.  Procedures:  Antimicrobials:   Data Reviewed: I have personally reviewed following labs and imaging studies  CBC: Recent Labs  Lab 09/08/19 0619 09/13/19 1345 09/14/19 0350  WBC 5.9 15.3* 7.8  NEUTROABS 3.9 13.8*  --   HGB 12.6 13.5 11.6*  HCT 39.2 43.0 36.9  MCV 93.3 93.9 92.9  PLT 145* 181 [id number]*   Basic Metabolic Panel: Recent Labs  Lab 09/08/19 0619 09/13/19 1345 09/14/19 0350 09/14/19 1251  NA 133* 130* 135 130*  K 4.1 4.6 4.4 4.2  CL 93* 88* 90* 83*  CO2 31 35* 38* 36*  GLUCOSE 147* 188* 153* 119*  BUN 29* 21* 20 20  CREATININE 0.49 0.66 0.50 0.50  CALCIUM 8.6* 8.6* 8.2* 8.2*   GFR: Estimated Creatinine Clearance: 79.5 mL/min (by C-G formula based on SCr of 0.5 mg/dL). Liver Function Tests: Recent Labs  Lab 09/08/19 0619  AST 19  ALT 21  ALKPHOS 54  BILITOT 0.5  PROT 6.5  ALBUMIN 3.1*   No results for input(s): LIPASE, AMYLASE in the last 168 hours. No results for  input(s): AMMONIA in the last 168 hours. Coagulation Profile: No results for input(s): INR, PROTIME in the last 168 hours. Cardiac Enzymes: No results for input(s): CKTOTAL, CKMB, CKMBINDEX, TROPONINI in the last 168 hours. BNP (last 3 results) No results for input(s): PROBNP in the last 8760 hours. HbA1C: No results for input(s): HGBA1C in the last 72 hours. CBG: Recent Labs  Lab 09/08/19 1153 09/13/19 1812 09/13/19 2156 09/14/19 0722 09/14/19 1216  GLUCAP 106* 145* 180* 124* 108*   Lipid Profile: No results for input(s): CHOL, HDL, LDLCALC, TRIG, CHOLHDL, LDLDIRECT in the last 72 hours. Thyroid Function Tests: No results for input(s): TSH, T4TOTAL, FREET4, T3FREE, THYROIDAB in the last 72 hours. Anemia Panel: No results for input(s): VITAMINB12, FOLATE, FERRITIN, TIBC, IRON, RETICCTPCT in the last 72 hours. Sepsis Labs: Recent Labs  Lab 09/13/19 1345 09/14/19 0650  PROCALCITON  --  <0.10  LATICACIDVEN 1.4  --     Recent Results (from the past 240 hour(s))  MRSA PCR Screening     Status: Abnormal   Collection Time: 09/04/19  5:57 PM   Specimen: Nasal Mucosa; Nasopharyngeal  Result Value Ref Range Status   MRSA by PCR POSITIVE (A) NEGATIVE Final    Comment:        The GeneXpert MRSA Assay (FDA  approved for NASAL specimens only), is one component of a comprehensive MRSA colonization surveillance program. It is not intended to diagnose MRSA infection nor to guide or monitor treatment for MRSA infections. RESULT CALLED TO, READ BACK BY AND VERIFIED WITH: MIKE BILLS 09/04/19 AT 1913 HS Performed at [facility], [place], [place] [address]   Blood culture (routine x 2)     Status: None (Preliminary result)   Collection Time: 09/13/19  1:45 PM   Specimen: BLOOD  Result Value Ref Range Status   Specimen Description BLOOD BLOOD RIGHT FOREARM  Final   Special Requests   Final    BOTTLES DRAWN AEROBIC AND ANAEROBIC Blood Culture adequate  volume   Culture   Final    NO GROWTH < 24 HOURS Performed at [facility], [address] [place], [place] [address]    Report Status PENDING  Incomplete  Blood culture (routine x 2)     Status: None (Preliminary result)   Collection Time: 09/13/19  1:53 PM   Specimen: BLOOD  Result Value Ref Range Status   Specimen Description BLOOD BLOOD LEFT HAND  Final   Special Requests   Final    BOTTLES DRAWN AEROBIC AND ANAEROBIC Blood Culture results may not be optimal due to an inadequate volume of blood received in culture bottles   Culture   Final    NO GROWTH < 24 HOURS Performed at [facility], [address] [place], [place] [address]    Report Status PENDING  Incomplete  Respiratory Panel by RT PCR (Flu A&B, Covid) - Nasopharyngeal Swab     Status: None   Collection Time: 09/13/19  4:35 PM   Specimen: Nasopharyngeal Swab  Result Value Ref Range Status   SARS Coronavirus 2 by RT PCR NEGATIVE NEGATIVE Final    Comment: (NOTE) SARS-CoV-2 target nucleic acids are NOT DETECTED. The SARS-CoV-2 RNA is generally detectable in upper respiratoy specimens during the acute phase of infection. The lowest concentration of SARS-CoV-2 viral copies this assay can detect is 131 copies/mL. A negative result does not preclude SARS-Cov-2 infection and should not be used as the sole basis for treatment or other patient management decisions. A negative result may occur with  improper specimen collection/handling, submission of specimen other than nasopharyngeal swab, presence of viral mutation(s) within the areas targeted by this assay, and inadequate number of viral copies (<131 copies/mL). A negative result must be combined with clinical observations, patient history, and epidemiological information. The expected result is Negative. Fact Sheet for Patients:  PinkCheek.be Fact Sheet for Healthcare Providers:    GravelBags.it This test is not yet ap proved or cleared by the [place] FDA and  has been authorized for detection and/or diagnosis of SARS-CoV-2 by FDA under an Emergency Use Authorization (EUA). This EUA will remain  in effect (meaning this test can be used) for the duration of the COVID-19 declaration under Section 564(b)(1) of the Act, 21 U.S.C. section 360bbb-3(b)(1), unless the authorization is terminated or revoked sooner.    Influenza A by PCR NEGATIVE NEGATIVE Final   Influenza B by PCR NEGATIVE NEGATIVE Final    Comment: (NOTE) The Xpert Xpress SARS-CoV-2/FLU/RSV assay is intended as an aid in  the diagnosis of influenza from Nasopharyngeal swab specimens and  should not be used as a sole basis for treatment. Nasal washings and  aspirates are unacceptable for Xpert Xpress SARS-CoV-2/FLU/RSV  testing. Fact Sheet for Patients: PinkCheek.be Fact Sheet for Healthcare Providers: GravelBags.it  This test is not yet approved or cleared by the [place] and  has been authorized for detection and/or diagnosis of SARS-CoV-2 by  FDA under an Emergency Use Authorization (EUA). This EUA will remain  in effect (meaning this test can be used) for the duration of the  Covid-19 declaration under Section 564(b)(1) of the Act, 21  U.S.C. section 360bbb-3(b)(1), unless the authorization is  terminated or revoked. Performed at [facility], [address] [place], [place] [address]      Radiology Studies: CT ANGIO CHEST PE W OR WO CONTRAST  Result Date: 09/13/2019 CLINICAL DATA:  PE suspected. Positive COVID-19 test. EXAM: CT ANGIOGRAPHY CHEST WITH CONTRAST TECHNIQUE: Multidetector CT imaging of the chest was performed using the standard protocol during bolus administration of intravenous contrast. Multiplanar CT image reconstructions and MIPs were obtained to evaluate the vascular  anatomy. CONTRAST:  40mL OMNIPAQUE IOHEXOL 350 MG/ML SOLN COMPARISON:  CT PE study dated May 14, 2018. FINDINGS: Cardiovascular: Contrast injection is sufficient to demonstrate satisfactory opacification of the pulmonary arteries to the segmental level. There is no pulmonary embolus. The main pulmonary artery is dilated measuring approximately 3.4 cm. There is no CT evidence of acute right heart strain. There are atherosclerotic changes of the visualized thoracic aorta without evidence for an aneurysm. There is extensive calcified atherosclerotic disease at the origin of the left subclavian artery. This likely results in high-grade stenosis or near occlusion and can contribute to steal syndrome in the appropriate clinical setting. The heart size is significantly enlarged. There is no pericardial effusion. Coronary artery calcifications are noted. The left atrium is significantly enlarged. Mediastinum/Nodes: --No mediastinal or hilar lymphadenopathy. --No axillary lymphadenopathy. --No supraclavicular lymphadenopathy. --Normal thyroid gland. --The esophagus is unremarkable Lungs/Pleura: There are moderate to large bilateral pleural effusions. Bibasilar compressive atelectasis is noted. There is interlobular septal thickening bilaterally. There is no large focal infiltrate. Emphysematous changes are noted bilaterally. The trachea is unremarkable. Upper Abdomen: Again noted is extensive atherosclerotic disease involving the abdominal aorta at the level of the celiac axis. 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 Musculoskeletal: Multilevel chronic appearing compression fractures are noted throughout the visualized thoracolumbar spine. These all appear to be essentially stable since 2019. Review of the MIP images confirms the above findings. IMPRESSION: 1. No evidence for acute pulmonary embolus. 2. Moderate to large bilateral pleural effusions with associated bibasilar  compressive atelectasis. 3. Interlobular septal thickening, compatible with pulmonary edema. 4. Cardiomegaly. 5. 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 6. Dilated main pulmonary artery which can be seen in patients with elevated pulmonary artery pressures. 7. Multiple additional chronic findings as detailed above. 8. Aortic Atherosclerosis (ICD10-I70.0) and Emphysema (ICD10-J43.9). Electronically Signed   By: Constance Holster M.D.   On: 09/13/2019 16:21   DG Chest Port 1 View  Result Date: 09/13/2019 CLINICAL DATA:  Recent discharge from hospital, now on CPAP with increasing low sats and SOB EXAM: PORTABLE CHEST 1 VIEW COMPARISON:  Chest radiograph 09/04/2019 FINDINGS: Stable cardiomediastinal contours with enlarged heart size. There are diffuse bilateral interstitial opacities. Hazy opacities in the lung bases likely a combination of atelectasis and pleural effusion. No pneumothorax. No acute finding in the visualized skeleton. IMPRESSION: Diffuse bilateral interstitial opacities and small bilateral effusions. Findings most suspicious for pulmonary edema, infection not excluded. Electronically Signed  By: Audie Pinto M.D.   On: 09/13/2019 14:25    Scheduled Meds: . buPROPion  150 mg Oral Daily  . calcium carbonate  400 mg of elemental calcium Oral Daily   And  . cholecalciferol  800 Units Oral Daily  . Chlorhexidine Gluconate Cloth  6 each Topical Daily  . clonazePAM  1 mg Oral BID  . dexamethasone (DECADRON) injection  6 mg Intravenous Q24H  . docusate sodium  100 mg Oral BID  . enoxaparin (LOVENOX) injection  40 mg Subcutaneous Q24H  . feeding supplement (ENSURE ENLIVE)  237 mL Oral Q24H  . fludrocortisone  0.1 mg Oral Daily  . fluticasone  2 spray Each Nare BID  . fluticasone furoate-vilanterol  1 puff Inhalation Daily  . [START ON  09/15/2019] furosemide  40 mg Intravenous Daily  . guaiFENesin  1,200 mg Oral Daily  . insulin aspart  0-20 Units Subcutaneous TID WC  . insulin aspart  6 Units Subcutaneous TID WC  . Ipratropium-Albuterol  1 puff Inhalation TID  . loratadine  10 mg Oral Daily  . lurasidone  20 mg Oral BID  . Melatonin  5 mg Oral QHS  . multivitamin with minerals  1 tablet Oral Daily  . mupirocin ointment  1 application Nasal BID  . nicotine  21 mg Transdermal Daily  . [START ON 09/15/2019] NIFEdipine  90 mg Oral Daily  . OLANZapine  10 mg Oral BID  . pantoprazole  40 mg Oral Daily  . polyethylene glycol  17 g Oral Daily  . sodium chloride flush  3 mL Intravenous Q12H  . sucralfate  1 g Oral TID WC & HS  . valproic acid  500 mg Oral TID   Continuous Infusions: . sodium chloride       LOS: 1 day   Time spent: 40 minutes.  Lorella Nimrod, MD [facility]  If 7PM-7AM, please contact night-coverage [email or web address]  09/14/2019, 3:50 PM   This record has been created using Systems analyst. Errors have been sought and corrected,but may not always be located. Such creation errors do not reflect on the standard of care.

## 2019-09-14 NOTE — Progress Notes (Signed)
Pt complained of difficulty breathing this AM and placed on bi-pap for approximately 4 hours. Now on HFNC tolerating well. EKG for bradycardia read by Dr. Humphrey Rolls. Tolerating diet. Ativan administered PRN for agitation and anxiety.

## 2019-09-14 NOTE — Progress Notes (Signed)
*  PRELIMINARY RESULTS* Echocardiogram 2D Echocardiogram has been performed.  Grizel Vesely M Shun Pletz 09/14/2019, 3:16 PM 

## 2019-09-14 NOTE — Telephone Encounter (Signed)
Patient is unable to reach. Called to follow up with patient after her recent hospital d/c on the 3/16 but patient has now been readmitted in the icu so therefore will be unable to keep new patient CHF Clinic appointment. Will reschedule after patient is discharged.    Alyse Low, [place]

## 2019-09-15 DIAGNOSIS — J9621 Acute and chronic respiratory failure with hypoxia: Secondary | ICD-10-CM

## 2019-09-15 DIAGNOSIS — J9622 Acute and chronic respiratory failure with hypercapnia: Principal | ICD-10-CM

## 2019-09-15 DIAGNOSIS — U071 COVID-19: Secondary | ICD-10-CM

## 2019-09-15 DIAGNOSIS — J1282 Pneumonia due to coronavirus disease 2019: Secondary | ICD-10-CM

## 2019-09-15 LAB — CBC
HCT: 35 % — ABNORMAL LOW (ref 36.0–46.0)
Hemoglobin: 11.3 g/dL — ABNORMAL LOW (ref 12.0–15.0)
MCH: 29.4 pg (ref 26.0–34.0)
MCHC: 32.3 g/dL (ref 30.0–36.0)
MCV: 91.1 fL (ref 80.0–100.0)
Platelets: 147 10*3/uL — ABNORMAL LOW (ref 150–400)
RBC: 3.84 MIL/uL — ABNORMAL LOW (ref 3.87–5.11)
RDW: 13.8 % (ref 11.5–15.5)
WBC: 9.6 10*3/uL (ref 4.0–10.5)
nRBC: 0 % (ref 0.0–0.2)

## 2019-09-15 LAB — ECHOCARDIOGRAM COMPLETE
Height: 60 in
Weight: 3312 oz

## 2019-09-15 LAB — BASIC METABOLIC PANEL
Anion gap: 10 (ref 5–15)
BUN: 27 mg/dL — ABNORMAL HIGH (ref 6–20)
CO2: 40 mmol/L — ABNORMAL HIGH (ref 22–32)
Calcium: 8.1 mg/dL — ABNORMAL LOW (ref 8.9–10.3)
Chloride: 84 mmol/L — ABNORMAL LOW (ref 98–111)
Creatinine, Ser: 0.52 mg/dL (ref 0.44–1.00)
GFR calc Af Amer: >60 mL/min (ref >60)
GFR calc non Af Amer: >60 mL/min (ref >60)
Glucose, Bld: 75 mg/dL (ref 70–99)
Potassium: 3.9 mmol/L (ref 3.5–5.1)
Sodium: 134 mmol/L — ABNORMAL LOW (ref 135–145)

## 2019-09-15 LAB — GLUCOSE, CAPILLARY
Glucose-Capillary: 89 mg/dL (ref 70–99)
Glucose-Capillary: 122 mg/dL — ABNORMAL HIGH (ref 70–99)
Glucose-Capillary: 224 mg/dL — ABNORMAL HIGH (ref 70–99)
Glucose-Capillary: 207 mg/dL — ABNORMAL HIGH (ref 70–99)

## 2019-09-15 LAB — PHOSPHORUS: Phosphorus: 3.3 mg/dL (ref 2.5–4.6)

## 2019-09-15 LAB — MAGNESIUM: Magnesium: 2.1 mg/dL (ref 1.7–2.4)

## 2019-09-15 MED ORDER — NIFEDIPINE ER OSMOTIC RELEASE 30 MG PO TB24
90.0000 mg | ORAL_TABLET | Freq: Every day | ORAL | Status: DC
  Administered 2019-09-15 – 2019-09-16 (×2): 90 mg via ORAL
  Filled 2019-09-15 (×2): qty 3

## 2019-09-15 MED ORDER — SPIRONOLACTONE 25 MG PO TABS
25.0000 mg | ORAL_TABLET | Freq: Every day | ORAL | Status: DC
  Administered 2019-09-15 – 2019-09-16 (×2): 25 mg via ORAL
  Filled 2019-09-15 (×2): qty 1

## 2019-09-15 MED ORDER — ASCORBIC ACID 500 MG PO TABS
1000.0000 mg | ORAL_TABLET | Freq: Every day | ORAL | Status: DC
  Administered 2019-09-15 – 2019-09-16 (×2): 1000 mg via ORAL
  Filled 2019-09-15 (×2): qty 2

## 2019-09-15 MED ORDER — METHYLPREDNISOLONE SODIUM SUCC 125 MG IJ SOLR
60.0000 mg | Freq: Two times a day (BID) | INTRAMUSCULAR | Status: DC
  Filled 2019-09-15: qty 2

## 2019-09-15 MED ORDER — ZINC SULFATE 220 (50 ZN) MG PO CAPS
220.0000 mg | ORAL_CAPSULE | Freq: Every day | ORAL | Status: DC
  Administered 2019-09-15 – 2019-09-16 (×2): 220 mg via ORAL
  Filled 2019-09-15 (×2): qty 1

## 2019-09-15 MED ORDER — MELATONIN 5 MG PO TABS
10.0000 mg | ORAL_TABLET | Freq: Every day | ORAL | Status: DC
  Administered 2019-09-15: 10 mg via ORAL
  Filled 2019-09-15: qty 2

## 2019-09-15 MED ORDER — DEXAMETHASONE SODIUM PHOSPHATE 10 MG/ML IJ SOLN
6.0000 mg | Freq: Every day | INTRAMUSCULAR | Status: DC
  Administered 2019-09-15 – 2019-09-16 (×2): 6 mg via INTRAVENOUS
  Filled 2019-09-15 (×2): qty 0.6

## 2019-09-15 NOTE — Progress Notes (Signed)
Patient intermittently anxious and experiencing pain. PRN morphine, ativan, and hydroxyzine given along with scheduled meds. PT order placed, per patients request. Oxygen weaned down, switched from a high flow nasal cannula to regular nasal cannula at 4L.

## 2019-09-15 NOTE — Progress Notes (Signed)
PROGRESS NOTE    Natalie Thomas  [id number] DOB: 10-21-1960 DOA: 09/13/2019 PCP: System, Pcp Not In   Brief Narrative:  Natalie Thomas is a 59 y.o. 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 She was brought into the ER by EMS and was said to have a pulse oximetry of 84% on her home 2 liters of oxygen.  She arrived on CPAP and had to be placed on a BiPAP, thought to be more due to CHF exacerbation.  Recent COVID-19 infection can be another factor.  Elevated BNP and chest x-ray with diffuse bilateral interstitial opacities and small bilateral effusions suspicious for pulmonary edema, infection not excluded.  Subjective: She is still complaining of some shortness of breath, although improved from before.  She appears comfortable and eating breakfast.  Assessment & Plan:   Principal Problem:   Acute on chronic respiratory failure with hypoxia and hypercapnia (HCC) Active Problems:   DM (diabetes mellitus), type 2 (HCC)   Schizoaffective disorder (HCC)   Essential hypertension   Schizoaffective disorder, bipolar type (HCC)   Obesity, Class III, BMI 40-49.9 (morbid obesity) ([place])   Acute on chronic respiratory failure with hypercapnia (HCC)   Leukocytosis  Acute on chronic hypercapneicRespiratory failure.  Most likely secondary to acute on chronic diastolic heart failure.  Post COVID-19 pneumonitis can be a factor. Repeat ABG with resolution of respiratory acidosis. She was requiring HFNC with up to 40  L after the BiPAP.  She appears comfortable and saturating close to 100% on that one saw today.  She is on 4 L at home.  CTA was negative for PE on 09/13/2019 -Try weaning her to her baseline. -Pulmonary consult-appreciate their recommendations. -Continue Decadron 6 mg daily for possible post Covid pneumonitis. -Continue supplemental oxygen to keep saturation above 90%.  Acute on chronic diastolic heart failure Last 2D echocardiogram shows an LVEF of 60 to 65%. Repeat echocardiogram with mildly reduced EF of 40 to 45% and LV global hypokinesis along with grade 3 diastolic dysfunction. -Continue IV Lasix 40 mg daily. -Spironolactone was added by cardiology today. -Continue holding metoprolol due to bradycardia. -Cardiology is following-appreciate their recommendations.  Bradycardia.  Patient remained asymptomatic.  Persistent bradycardia with heart rate in 40s to low 50s. -Continue holding metoprolol and observe.  Hypertension.  Currently blood pressure is little soft. -Holding metoprolol for bradycardia. -Hold nicardipine for systolic below [id number].  Diabetes.  Well-controlled with A1c of 5.  CBG elevated as patient is on steroid. -Continue with 6 units of NovoLog with meals and resistant SSI. -Can add long-acting if needed.  Schizoaffective disorder Continue Klonopin, Zyprexa, melatonin, Latuda  Chronic hyponatremia Serum sodium  134 Patient was recently admitted to [facility] in [facility]. There was a concern raised about psychogenic polydipsia versus SIADH per nephrology Continue florinefand water restriction  GERD Continue PPI  Obesity, Morbid BMI 40 Complicates overall prognosis and care   Leukocytosis.  Resolved today. Related to recent steroid administration.  No evidence of infection at this time, PCT less than 0.10.  COPD not in acute exacerbation Continue as needed bronchodilator therapy Continue inhaled steroids  Objective: Vitals:   09/15/19 0700  09/15/19 0800 09/15/19 0900 09/15/19 1000  BP: 119/83 (!) 98/46 102/70   Pulse: (!) 48 (!) 52 60 (!) 54  Resp: 17 20 20 15   Temp:      TempSrc:      SpO2: 100% 98% 98% 95%  Weight:      Height:        Intake/Output Summary (Last 24 hours) at 09/15/2019 1309 Last data filed at 09/15/2019 1000 Gross per 24 hour  Intake 370 ml  Output 400 ml  Net -30 ml   Filed Weights   09/13/19 1342  Weight: 93.9 kg    Examination:  General exam: Alert, obese lady, appears calm and comfortable. Respiratory system: Clear bilaterally, respiratory effort normal. Cardiovascular system: S1 & S2 heard, RRR. No JVD, murmurs, rubs, gallops or clicks. Gastrointestinal system: Soft, nontender, nondistended, bowel sounds positive. Central nervous system: Alert and oriented. No focal neurological deficits.Symmetric 5 x 5 power. Extremities: Trace LE edema, no cyanosis, pulses intact and symmetrical. Skin: No rashes, lesions or ulcers Psychiatry: Judgement and insight appear normal.   DVT prophylaxis: Lovenox. Code Status: Full. Family Communication: And was updated on phone. Disposition Plan: Pending improvement patient is currently on HFNC with higher oxygen requirement.  Trying to wean her off to her baseline of 4 L.  She will go back to her facility.  Consultants:   Cardiology.  Pulmonary  Procedures:  Antimicrobials:   Data Reviewed: I have personally reviewed following labs and imaging studies  CBC: Recent Labs  Lab 09/13/19 1345 09/14/19 0350 09/15/19 0401  WBC 15.3* 7.8 9.6  NEUTROABS 13.8*  --   --   HGB 13.5 11.6* 11.3*  HCT 43.0 36.9 35.0*  MCV 93.9 92.9 91.1  PLT 181 144* [id number]*   Basic Metabolic Panel: Recent Labs  Lab 09/13/19 1345 09/14/19 0350 09/14/19 1251 09/15/19 0401  NA 130* 135 130* 134*  K 4.6 4.4 4.2 3.9  CL 88* 90* 83* 84*  CO2 35* 38* 36* 40*  GLUCOSE 188* 153* 119* 75  BUN 21* 20 20 27*  CREATININE 0.66 0.50 0.50 0.52  CALCIUM 8.6* 8.2* 8.2* 8.1*    MG  --   --   --  2.1  PHOS  --   --   --  3.3   GFR: Estimated Creatinine Clearance: 79.5 mL/min (by C-G formula based on SCr of 0.52 mg/dL). Liver Function Tests: No results for input(s): AST, ALT, ALKPHOS, BILITOT, PROT, ALBUMIN in the last 168 hours. No results for input(s): LIPASE, AMYLASE in the last 168 hours. No results for input(s): AMMONIA in the last 168 hours. Coagulation Profile: No results for input(s): INR, PROTIME in the last 168 hours. Cardiac Enzymes: No results for input(s): CKTOTAL, CKMB, CKMBINDEX, TROPONINI in the last 168 hours. BNP (last 3 results) No results for input(s): PROBNP in the last 8760 hours. HbA1C: No results for input(s): HGBA1C in the last 72 hours. CBG: Recent Labs  Lab 09/14/19 1216 09/14/19 1603 09/14/19 2027 09/15/19 0835 09/15/19 1231  GLUCAP 108* 206* 129* 89 122*   Lipid Profile: No results for input(s): CHOL, HDL, LDLCALC, TRIG, CHOLHDL, LDLDIRECT in the last 72 hours. Thyroid Function Tests: No results for input(s): TSH, T4TOTAL, FREET4, T3FREE, THYROIDAB in the last 72 hours. Anemia Panel: No results for input(s): VITAMINB12, FOLATE, FERRITIN, TIBC, IRON, RETICCTPCT in the last 72 hours. Sepsis Labs: Recent Labs  Lab 09/13/19 1345 09/14/19 0650  PROCALCITON  --  <0.10  LATICACIDVEN  1.4  --     Recent Results (from the past 240 hour(s))  Blood culture (routine x 2)     Status: None (Preliminary result)   Collection Time: 09/13/19  1:45 PM   Specimen: BLOOD  Result Value Ref Range Status   Specimen Description BLOOD BLOOD RIGHT FOREARM  Final   Special Requests   Final    BOTTLES DRAWN AEROBIC AND ANAEROBIC Blood Culture adequate volume   Culture   Final    NO GROWTH 2 DAYS Performed at [facility], [address], [place], [place] [address]    Report Status PENDING  Incomplete  Blood culture (routine x 2)     Status: None (Preliminary result)   Collection Time: 09/13/19  1:53 PM   Specimen: BLOOD   Result Value Ref Range Status   Specimen Description BLOOD BLOOD LEFT HAND  Final   Special Requests   Final    BOTTLES DRAWN AEROBIC AND ANAEROBIC Blood Culture results may not be optimal due to an inadequate volume of blood received in culture bottles   Culture   Final    NO GROWTH 2 DAYS Performed at [facility], [address], [place], [place] [address]    Report Status PENDING  Incomplete  Respiratory Panel by RT PCR (Flu A&B, Covid) - Nasopharyngeal Swab     Status: None   Collection Time: 09/13/19  4:35 PM   Specimen: Nasopharyngeal Swab  Result Value Ref Range Status   SARS Coronavirus 2 by RT PCR NEGATIVE NEGATIVE Final    Comment: (NOTE) SARS-CoV-2 target nucleic acids are NOT DETECTED. The SARS-CoV-2 RNA is generally detectable in upper respiratoy specimens during the acute phase of infection. The lowest concentration of SARS-CoV-2 viral copies this assay can detect is 131 copies/mL. A negative result does not preclude SARS-Cov-2 infection and should not be used as the sole basis for treatment or other patient management decisions. A negative result may occur with  improper specimen collection/handling, submission of specimen other than nasopharyngeal swab, presence of viral mutation(s) within the areas targeted by this assay, and inadequate number of viral copies (<131 copies/mL). A negative result must be combined with clinical observations, patient history, and epidemiological information. The expected result is Negative. Fact Sheet for Patients:  PinkCheek.be Fact Sheet for Healthcare Providers:  GravelBags.it This test is not yet ap proved or cleared by the [place] FDA and  has been authorized for detection and/or diagnosis of SARS-CoV-2 by FDA under an Emergency Use Authorization (EUA). This EUA will remain  in effect (meaning this test can be used) for the duration of the COVID-19  declaration under Section 564(b)(1) of the Act, 21 U.S.C. section 360bbb-3(b)(1), unless the authorization is terminated or revoked sooner.    Influenza A by PCR NEGATIVE NEGATIVE Final   Influenza B by PCR NEGATIVE NEGATIVE Final    Comment: (NOTE) The Xpert Xpress SARS-CoV-2/FLU/RSV assay is intended as an aid in  the diagnosis of influenza from Nasopharyngeal swab specimens and  should not be used as a sole basis for treatment. Nasal washings and  aspirates are unacceptable for Xpert Xpress SARS-CoV-2/FLU/RSV  testing. Fact Sheet for Patients: PinkCheek.be Fact Sheet for Healthcare Providers: GravelBags.it This test is not yet approved or cleared by the [place] FDA and  has been authorized for detection and/or diagnosis of SARS-CoV-2 by  FDA under an Emergency Use Authorization (EUA). This EUA will remain  in effect (meaning this test can be used) for the duration  of the  Covid-19 declaration under Section 564(b)(1) of the Act, 21  U.S.C. section 360bbb-3(b)(1), unless the authorization is  terminated or revoked. Performed at [facility], [address] [place], [place] [address]      Radiology Studies: CT ANGIO CHEST PE W OR WO CONTRAST  Result Date: 09/13/2019 CLINICAL DATA:  PE suspected. Positive COVID-19 test. EXAM: CT ANGIOGRAPHY CHEST WITH CONTRAST TECHNIQUE: Multidetector CT imaging of the chest was performed using the standard protocol during bolus administration of intravenous contrast. Multiplanar CT image reconstructions and MIPs were obtained to evaluate the vascular anatomy. CONTRAST:  23mL OMNIPAQUE IOHEXOL 350 MG/ML SOLN COMPARISON:  CT PE study dated May 14, 2018. FINDINGS: Cardiovascular: Contrast injection is sufficient to demonstrate satisfactory opacification of the pulmonary arteries to the segmental level. There is no pulmonary embolus. The main pulmonary artery is dilated  measuring approximately 3.4 cm. There is no CT evidence of acute right heart strain. There are atherosclerotic changes of the visualized thoracic aorta without evidence for an aneurysm. There is extensive calcified atherosclerotic disease at the origin of the left subclavian artery. This likely results in high-grade stenosis or near occlusion and can contribute to steal syndrome in the appropriate clinical setting. The heart size is significantly enlarged. There is no pericardial effusion. Coronary artery calcifications are noted. The left atrium is significantly enlarged. Mediastinum/Nodes: --No mediastinal or hilar lymphadenopathy. --No axillary lymphadenopathy. --No supraclavicular lymphadenopathy. --Normal thyroid gland. --The esophagus is unremarkable Lungs/Pleura: There are moderate to large bilateral pleural effusions. Bibasilar compressive atelectasis is noted. There is interlobular septal thickening bilaterally. There is no large focal infiltrate. Emphysematous changes are noted bilaterally. The trachea is unremarkable. Upper Abdomen: Again noted is extensive atherosclerotic disease involving the abdominal aorta at the level of the celiac axis. 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 Musculoskeletal: Multilevel chronic appearing compression fractures are noted throughout the visualized thoracolumbar spine. These all appear to be essentially stable since 2019. Review of the MIP images confirms the above findings. IMPRESSION: 1. No evidence for acute pulmonary embolus. 2. Moderate to large bilateral pleural effusions with associated bibasilar compressive atelectasis. 3. Interlobular septal thickening, compatible with pulmonary edema. 4. Cardiomegaly. 5. 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 6. Dilated main pulmonary  artery which can be seen in patients with elevated pulmonary artery pressures. 7. Multiple additional chronic findings as detailed above. 8. Aortic Atherosclerosis (ICD10-I70.0) and Emphysema (ICD10-J43.9). Electronically Signed   By: Constance Holster M.D.   On: 09/13/2019 16:21   DG Chest Port 1 View  Result Date: 09/13/2019 CLINICAL DATA:  Recent discharge from hospital, now on CPAP with increasing low sats and SOB EXAM: PORTABLE CHEST 1 VIEW COMPARISON:  Chest radiograph 09/04/2019 FINDINGS: Stable cardiomediastinal contours with enlarged heart size. There are diffuse bilateral interstitial opacities. Hazy opacities in the lung bases likely a combination of atelectasis and pleural effusion. No pneumothorax. No acute finding in the visualized skeleton. IMPRESSION: Diffuse bilateral interstitial opacities and small bilateral effusions. Findings most suspicious for pulmonary edema, infection not excluded. Electronically Signed   By: Audie Pinto M.D.   On: 09/13/2019 14:25   ECHOCARDIOGRAM COMPLETE  Result Date: 09/15/2019    ECHOCARDIOGRAM REPORT   Patient Name:   KIELYN DUDZIAK Date of Exam: 09/14/2019 Medical Rec #:  [id number]  Height:       60.0 in Accession #:    [id number]         Weight:       207.0 lb Date of Birth:  1961/02/05           BSA:          1.894 m Patient Age:    33 years           BP:           110/59 mmHg Patient Gender: F                  HR:           51 bpm. Exam Location:  [facility] Procedure: 2D Echo, Color Doppler and Cardiac Doppler Indications:     I50.9 Congestive Heart Failure  History:         Patient has prior history of Echocardiogram examinations. COPD;                  Risk Factors:Hypertension, Dyslipidemia and Diabetes. Pt tested                  positive for covid19 on 09/07/2019.  Sonographer:     Charmayne Sheer RDCS (AE) Referring Phys:  [id number] [place] Diagnosing Phys: Neoma Laming MD  Sonographer Comments: Suboptimal apical window and suboptimal  subcostal window. IMPRESSIONS  1. Left ventricular ejection fraction, by estimation, is 40 to 45%. The left ventricle has mildly decreased function. The left ventricle demonstrates global hypokinesis. The left ventricular internal cavity size was mildly dilated. Left ventricular diastolic parameters are consistent with Grade III diastolic dysfunction (restrictive).  2. Right ventricular systolic function is normal. The right ventricular size is normal.  3. The pericardial effusion is circumferential. Large pleural effusion.  4. The mitral valve is normal in structure. Trivial mitral valve regurgitation. No evidence of mitral stenosis.  5. The aortic valve is normal in structure. Aortic valve regurgitation is not visualized. No aortic stenosis is present.  6. The inferior vena cava is normal in size with greater than 50% respiratory variability, suggesting right atrial pressure of 3 mmHg. FINDINGS  Left Ventricle: Left ventricular ejection fraction, by estimation, is 40 to 45%. The left ventricle has mildly decreased function. The left ventricle demonstrates global hypokinesis. The left ventricular internal cavity size was mildly dilated. There is  no left ventricular hypertrophy. Left ventricular diastolic parameters are consistent with Grade III diastolic dysfunction (restrictive). Right Ventricle: The right ventricular size is normal. No increase in right ventricular wall thickness. Right ventricular systolic function is normal. Left Atrium: Left atrial size was normal in size. Right Atrium: Right atrial size was normal in size. Pericardium: Trivial pericardial effusion is present. The pericardial effusion is circumferential. Mitral Valve: The mitral valve is normal in structure. There is moderate calcification of the mitral valve leaflet(s). Normal mobility of the mitral valve leaflets. Trivial mitral valve regurgitation. No evidence of mitral valve stenosis. MV peak gradient, 7.0 mmHg. The mean mitral valve  gradient is 2.0 mmHg. Tricuspid Valve: The tricuspid valve is normal in structure. Tricuspid valve regurgitation is trivial. No evidence of tricuspid stenosis. Aortic Valve: The aortic valve is normal in structure. Aortic valve regurgitation is not visualized. No aortic stenosis is present. Aortic valve mean gradient measures 4.0 mmHg. Aortic valve peak gradient measures 8.0 mmHg. Aortic valve area, by VTI measures 3.06 cm. Pulmonic Valve: The pulmonic valve was normal in structure. Pulmonic valve regurgitation is  trivial. No evidence of pulmonic stenosis. Aorta: The aortic root is normal in size and structure. Venous: The inferior vena cava is normal in size with greater than 50% respiratory variability, suggesting right atrial pressure of 3 mmHg. IAS/Shunts: No atrial level shunt detected by color flow Doppler. Additional Comments: There is a large pleural effusion.  LEFT VENTRICLE PLAX 2D LVIDd:         5.31 cm  Diastology LVIDs:         4.16 cm  LV e' lateral:   4.68 cm/s LV PW:         1.21 cm  LV E/e' lateral: 22.6 LV IVS:        1.05 cm  LV e' medial:    4.90 cm/s LVOT diam:     2.10 cm  LV E/e' medial:  21.6 LV SV:         65 LV SV Index:   35 LVOT Area:     3.46 cm  LEFT ATRIUM             Index LA diam:        4.40 cm 2.32 cm/m LA Vol (A2C):   92.5 ml 48.84 ml/m LA Vol (A4C):   94.6 ml 49.94 ml/m LA Biplane Vol: 95.8 ml 50.58 ml/m  AORTIC VALVE                   PULMONIC VALVE AV Area (Vmax):    2.58 cm    PV Vmax:       0.94 m/s AV Area (Vmean):   2.65 cm    PV Vmean:      61.100 cm/s AV Area (VTI):     3.06 cm    PV VTI:        0.172 m AV Vmax:           141.00 cm/s PV Peak grad:  3.5 mmHg AV Vmean:          88.200 cm/s PV Mean grad:  2.0 mmHg AV VTI:            0.214 m AV Peak Grad:      8.0 mmHg AV Mean Grad:      4.0 mmHg LVOT Vmax:         105.00 cm/s LVOT Vmean:        67.400 cm/s LVOT VTI:          0.189 m LVOT/AV VTI ratio: 0.88  AORTA Ao Root diam: 3.00 cm MITRAL VALVE MV Area (PHT):  2.56 cm     SHUNTS MV Peak grad:  7.0 mmHg     Systemic VTI:  0.19 m MV Mean grad:  2.0 mmHg     Systemic Diam: 2.10 cm MV Vmax:       1.32 m/s MV Vmean:      69.2 cm/s MV Decel Time: 296 msec MV E velocity: 106.00 cm/s MV A velocity: 65.50 cm/s MV E/A ratio:  1.62 Neoma Laming MD Electronically signed by Neoma Laming MD Signature Date/Time: 09/15/2019/9:58:20 AM    Final     Scheduled Meds: . vitamin C  1,000 mg Oral Daily  . buPROPion  150 mg Oral Daily  . calcium carbonate  400 mg of elemental calcium Oral Daily   And  . cholecalciferol  800 Units Oral Daily  . Chlorhexidine Gluconate Cloth  6 each Topical Daily  . clonazePAM  1 mg Oral BID  . dexamethasone (DECADRON) injection  6 mg Intravenous  Daily  . docusate sodium  100 mg Oral BID  . enoxaparin (LOVENOX) injection  40 mg Subcutaneous Q24H  . feeding supplement (ENSURE ENLIVE)  237 mL Oral Q24H  . fludrocortisone  0.1 mg Oral Daily  . fluticasone  2 spray Each Nare BID  . fluticasone furoate-vilanterol  1 puff Inhalation Daily  . furosemide  40 mg Intravenous Daily  . guaiFENesin  1,200 mg Oral Daily  . insulin aspart  0-20 Units Subcutaneous TID WC  . insulin aspart  6 Units Subcutaneous TID WC  . Ipratropium-Albuterol  1 puff Inhalation TID  . loratadine  10 mg Oral Daily  . lurasidone  20 mg Oral BID  . melatonin  10 mg Oral QHS  . multivitamin with minerals  1 tablet Oral Daily  . mupirocin ointment  1 application Nasal BID  . nicotine  21 mg Transdermal Daily  . NIFEdipine  90 mg Oral Daily  . OLANZapine  10 mg Oral BID  . pantoprazole  40 mg Oral Daily  . polyethylene glycol  17 g Oral Daily  . sodium chloride flush  3 mL Intravenous Q12H  . spironolactone  25 mg Oral Daily  . sucralfate  1 g Oral TID WC & HS  . valproic acid  500 mg Oral TID  . zinc sulfate  220 mg Oral Daily   Continuous Infusions: . sodium chloride       LOS: 2 days   Time spent: 40 minutes.  Lorella Nimrod, MD [facility]  If  7PM-7AM, please contact night-coverage [email or web address]  09/15/2019, 1:09 PM   This record has been created using Systems analyst. Errors have been sought and corrected,but may not always be located. Such creation errors do not reflect on the standard of care.

## 2019-09-15 NOTE — Progress Notes (Signed)
PT Cancellation Note  Patient Details Name: Natalie Thomas MRN: [id number] DOB: Jun 24, 1961   Cancelled Treatment:    Reason Eval/Treat Not Completed: Medical issues which prohibited therapy(Consult received and chart reviewed.  Patient currently transferred to CCU for respiratory management (with meds to manage anxiety/agitation).  Will hold PT evaluation at this time and re-attempt as medically appropriate next date.)   Cianni Manny H. Owens Shark, PT, DPT, NCS 09/15/19, 10:30 AM [phone]

## 2019-09-15 NOTE — TOC Initial Note (Addendum)
Transition of Care [facility]) - Initial/Assessment Note    Patient Details  Name: Natalie Thomas MRN: [id number] Date of Birth: [birth year]  Transition of Care [facility]) CM/SW Contact:    Magnus Ivan, LCSW Phone Number: 09/15/2019, 8:48 AM  Clinical Narrative:                Patient is a long-term resident at [facility]. CSW spoke with patient's Legal Guardian, [name], and [employer], [employer] Confirmed the plan is for patient to return to the SNF at discharge. CSW will continue to follow.   1:45- CSW was informed by RN that patient does not want to go back to [name]. Patient has a legal guardian who would be responsible for all care decisions. CSW called [employer] [name] reported they do not have concerns regarding patient's care at [facility] and the plan is for patient to return back there.    Expected Discharge Plan: Skilled Nursing Facility Barriers to Discharge: Continued Medical Work up   Patient Goals and CMS Choice     Choice offered to / list presented to : [place] / Guardian  Expected Discharge Plan and Services Expected Discharge Plan: [place] Acute Care Choice: [place] Living arrangements for the past 2 months: [place]                                      Prior Living Arrangements/Services Living arrangements for the past 2 months: [place] Lives with:: Facility Resident Patient language and need for interpreter reviewed:: Yes Do you feel safe going back to the place where you live?: Yes          Current home services: DME Criminal Activity/Legal Involvement Pertinent to Current Situation/Hospitalization: No - Comment as needed  Activities of Daily Living Home Assistive Devices/Equipment: None ADL Screening (condition at time of admission) Patient's cognitive ability adequate to safely complete  daily activities?: Yes Is the patient deaf or have difficulty hearing?: No Does the patient have difficulty seeing, even when wearing glasses/contacts?: No Does the patient have difficulty concentrating, remembering, or making decisions?: No Patient able to express need for assistance with ADLs?: Yes Does the patient have difficulty dressing or bathing?: Yes Independently performs ADLs?: No Communication: Independent Dressing (OT): Needs assistance Is this a change from baseline?: Pre-admission baseline Grooming: Needs assistance Is this a change from baseline?: Pre-admission baseline Feeding: Independent Bathing: Needs assistance Is this a change from baseline?: Pre-admission baseline Toileting: Needs assistance Is this a change from baseline?: Pre-admission baseline In/Out Bed: Needs assistance Is this a change from baseline?: Pre-admission baseline Walks in Home: Needs assistance Is this a change from baseline?: Pre-admission baseline Does the patient have difficulty walking or climbing stairs?: Yes Weakness of Legs: Both Weakness of Arms/Hands: Both  Permission Sought/Granted Permission sought to share information with : Chartered certified accountant granted to share information with : Yes, Verbal Permission Granted     Permission granted to share info w AGENCY: [facility]  Permission granted to share info w Relationship: Legal Guardian     Emotional Assessment         Alcohol / Substance Use: Not Applicable Psych Involvement: No (comment)  Admission diagnosis:  Hypoxia [R09.02] Acute on chronic respiratory failure with hypercapnia (HCC) [J96.22] Acute respiratory failure with hypoxia and hypercapnia ([place]) [J96.01,  J96.02] Patient Active Problem List   Diagnosis Date Noted  . Acute on chronic respiratory failure with hypercapnia ([place]) 09/13/2019  . Leukocytosis 09/13/2019  . Obesity, Class III, BMI 40-49.9 (morbid obesity) ([place])   . Acute  on chronic respiratory failure with hypoxemia ([place]) 09/04/2019  . COVID-19 virus infection   . 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 Nausea   . Acute gastritis   . Abdominal pain, chronic, epigastric   . Hyponatremia 07/09/2019  . Ventral hernia 07/09/2019  . Hypertensive crisis 05/01/2019  . Acute abdominal pain 03/15/2019  . Pleural effusion 03/13/2019  . Atelectasis 03/13/2019  . Chronic diastolic CHF (congestive heart failure) ([place]) 03/13/2019  . Type 2 diabetes mellitus without complication ([place]) [id number]  . Urinary tract bacterial infections 03/09/2019  . Acute cystitis without hematuria   . Constipation   . Acute hypoxemic respiratory failure ([place]) 12/03/2018  . Irritability 11/09/2018  . Aggressive behavior 11/09/2018  . Agitation   . S/P right hip fracture 11/07/2017  . Closed comminuted intertrochanteric fracture of right femur ([place]) 11/07/2017  . Anxiety and depression   . Dyspnea 10/16/2017  . Anemia 10/16/2017  . COPD exacerbation ([place]) 10/07/2017  . Hyperlipidemia 10/07/2017  . Acute on chronic respiratory failure with hypoxia and hypercapnia ([place]) 10/07/2017  . Incontinence of urine 08/14/2011  . Schizoaffective disorder, bipolar type ([place]) 07/05/2011  . Cough 05/08/2011  . Lumbar disc disease 04/09/2011  . Polycythemia secondary to smoking 04/09/2011  . HIP PAIN, LEFT 12/12/2007  . 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 DM (diabetes mellitus), type 2 ([place]) 04/08/2007  . MENOPAUSE, PREMATURE 04/08/2007  . Chronic hyponatremia 04/08/2007  . Schizoaffective disorder ([place]) 04/08/2007  . Essential hypertension 04/08/2007  . COPD (chronic obstructive pulmonary disease) ([place]) 04/08/2007  . GERD 04/08/2007  . Osteoporosis 04/08/2007   PCP:  System, Pcp Not In Pharmacy:   [facility], [place] [address] Phone: [phone] Fax: [phone]     Social Determinants of Health (SDOH) Interventions     Readmission Risk Interventions Readmission Risk Prevention Plan 09/15/2019 09/08/2019 07/10/2019  Transportation Screening Complete Complete Complete  Medication Review Press photographer) Complete Complete Referral to Pharmacy  PCP or Specialist appointment within 3-5 days of discharge Complete Complete Complete  HRI or Home Care Consult Complete Complete Not Complete  HRI or Home Care Consult Pt Refusal Comments - - pt is resident of [facility]  SW Recovery Care/Counseling Consult - Complete Complete  Palliative Care Screening - Not Applicable Not [place] Complete Complete Not Applicable  Some recent data might be hidden

## 2019-09-15 NOTE — Progress Notes (Signed)
SYNOPSIS 59 yo morbidly obese WF with Hypoxic resp failure due to COVID 19 pneumonia with acute systolic CHF exacerbation  CC  RESP FAILURE   HPI 59 y.o. 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    Patient was recently discharged from the hospital on 09/08/19 after treatment for acute on chronic respiratory failure secondary to acute CHF exacerbation and Covid pneumonia.    Patient was said to have tested positive for the COVID-19 virus on 09/01/19 but had a negative PCR test here in the hospital.    She was brought into the ER by EMS and was said to have a pulse oximetry of 84% on her home 2 liters of oxygen.    She arrived on CPAP and had to be placed on a BiPAP.  She denies having any chest pain, diaphoresis, palpitations, nausea, vomiting,abdominal pain, dizziness or lightheadedness   Labs revealed significantly elevated BNP levels and chest x ray showed diffuse bilateral interstitial opacities and small bilateral effusions. Findings most suspicious for pulmonary edema, infection not excluded.     ED Course: The patient had presented fordyspnea and hypoxia on CPAP. She was subsequently changed over to BiPAP which she tolerated much more easily. Patient's labsare nonspecific and likely reflects multiple etiologies including COPD and CHF. Patient's imagingagain is nonspecific, resemble CHF, COPD and COVID-19. She received broad-spectrum antibiotic therapy.She remains on BiPAP. She has already completed courses of steroids and remdesivir. She will be admitted to the hospital for further evaluation  Alert and awake Follows commands fio2 down to 35%  on 30 L High flow [place]       VITALS: BP 102/70   Pulse (!) 54   Temp 97.8 F (36.6 C) (Oral)   Resp 15   Ht 5' (1.524 m)   Wt 93.9 kg   SpO2 95%   BMI 40.43 kg/m     I/O last 3 completed shifts: In: 0  Out: 3300 [Urine:3300] Total I/O In: 370 [P.O.:370] Out: -   SpO2: 95 % O2 Flow Rate (L/min): 35 L/min FiO2 (%): (S) 40 %  Estimated body mass index is 40.43 kg/m as calculated from the following:   Height as of this encounter: 5' (1.524 m).   Weight as of this encounter: 93.9 kg.   Review of Systems:  Gen:  Weakness, SOB HEENT: Denies blurred vision, double vision, ear pain, eye pain, hearing loss, nose bleeds, sore throat Cardiac:  No dizziness, chest pain or heaviness, chest tightness,edema, No JVD Resp:   +shortness of breath,-wheezing, -hemoptysis,  Other:  All other systems negative     Physical Examination:   General Appearance: + distress  Neuro:without focal findings,  speech normal,  HEENT: PERRLA, EOM intact.   Pulmonary: normal breath sounds, No wheezing.  CardiovascularNormal S1,S2.  No m/r/g.   Abdomen: Benign, Soft, non-tender. Renal:  No costovertebral tenderness  GU:  Not performed at this time. Endoc: No evident thyromegaly Skin:   warm, no rashes, no ecchymosis  Extremities: normal, no cyanosis, clubbing. PSYCHIATRIC: +anxious     I personally reviewed Labs under Results section.   MEDICATIONS: I have reviewed all medications and confirmed regimen as documented   CULTURE RESULTS   Recent Results (from the past 240 hour(s))  Blood culture (routine x 2)     Status: None (Preliminary result)   Collection Time: 09/13/19  1:45 PM   Specimen: BLOOD  Result Value Ref Range Status   Specimen Description BLOOD BLOOD RIGHT FOREARM  Final   Special Requests   Final    BOTTLES DRAWN AEROBIC AND ANAEROBIC Blood Culture adequate volume   Culture   Final    NO GROWTH 2 DAYS Performed at [facility], [address],  [place], [place] [address]    Report Status PENDING  Incomplete  Blood culture (routine x 2)     Status: None (Preliminary result)   Collection Time: 09/13/19  1:53 PM   Specimen: BLOOD  Result Value Ref Range Status   Specimen Description BLOOD BLOOD LEFT HAND  Final   Special Requests   Final    BOTTLES DRAWN AEROBIC AND ANAEROBIC Blood Culture results may not be optimal due to an inadequate volume of blood received in culture bottles   Culture   Final    NO GROWTH 2 DAYS Performed at [facility], [address] [place], [place] [address]    Report Status PENDING  Incomplete  Respiratory Panel by RT PCR (Flu A&B, Covid) - Nasopharyngeal Swab     Status: None   Collection Time: 09/13/19  4:35 PM   Specimen: Nasopharyngeal Swab  Result Value Ref Range Status   SARS Coronavirus 2 by RT PCR NEGATIVE NEGATIVE Final    Comment: (NOTE) SARS-CoV-2 target nucleic acids are NOT DETECTED. The SARS-CoV-2 RNA is generally detectable in upper respiratoy specimens during the acute phase of infection. The lowest concentration of SARS-CoV-2 viral copies this assay can detect is 131 copies/mL. A negative result does not preclude SARS-Cov-2 infection and should not be used as the sole basis for treatment or other patient management decisions. A negative result may occur with  improper specimen collection/handling, submission of specimen other than nasopharyngeal swab, presence of viral mutation(s) within the areas targeted by this assay, and inadequate number of viral copies (<131 copies/mL). A negative result must be combined with clinical observations, patient history, and epidemiological information. The expected result is Negative. Fact Sheet for Patients:  PinkCheek.be Fact Sheet for Healthcare Providers:  GravelBags.it This test is not yet ap proved or cleared by the [place] FDA and  has been authorized for detection  and/or diagnosis of SARS-CoV-2 by FDA under an Emergency Use Authorization (EUA). This EUA will remain  in effect (meaning this test can be used) for the duration of the COVID-19 declaration under Section 564(b)(1) of the Act, 21 U.S.C. section 360bbb-3(b)(1), unless the authorization is terminated or revoked sooner.    Influenza A by PCR NEGATIVE NEGATIVE Final   Influenza B by PCR NEGATIVE NEGATIVE Final    Comment: (NOTE) The Xpert Xpress SARS-CoV-2/FLU/RSV assay is intended as an aid in  the diagnosis of influenza from Nasopharyngeal swab specimens and  should not be used as a sole basis for treatment. Nasal washings and  aspirates are unacceptable for Xpert Xpress SARS-CoV-2/FLU/RSV  testing. Fact Sheet for Patients: PinkCheek.be Fact Sheet for Healthcare Providers: GravelBags.it This test is not yet approved or cleared by the [place] FDA and  has been authorized for detection and/or diagnosis of SARS-CoV-2 by  FDA under an Emergency Use Authorization (EUA). This EUA will remain  in effect (meaning this test can be used) for the duration of the  Covid-19 declaration under Section 564(b)(1) of the Act, 21  U.S.C. section  360bbb-3(b)(1), unless the authorization is  terminated or revoked. Performed at [facility], [address] [place], [place] [address]           IMAGING    ECHOCARDIOGRAM COMPLETE  Result Date: 09/15/2019    ECHOCARDIOGRAM REPORT   Patient Name:   Natalie Thomas Date of Exam: 09/14/2019 Medical Rec #:  [id number]          Height:       60.0 in Accession #:    [id number]         Weight:       207.0 lb Date of Birth:  Apr 25, 1961           BSA:          1.894 m Patient Age:    51 years           BP:           110/59 mmHg Patient Gender: F                  HR:           51 bpm. Exam Location:  [facility] Procedure: 2D Echo, Color Doppler and Cardiac Doppler Indications:     I50.9 Congestive  Heart Failure  History:         Patient has prior history of Echocardiogram examinations. COPD;                  Risk Factors:Hypertension, Dyslipidemia and Diabetes. Pt tested                  positive for covid19 on 09/07/2019.  Sonographer:     Charmayne Sheer RDCS (AE) Referring Phys:  [id number] [place] Diagnosing Phys: Neoma Laming MD  Sonographer Comments: Suboptimal apical window and suboptimal subcostal window. IMPRESSIONS  1. Left ventricular ejection fraction, by estimation, is 40 to 45%. The left ventricle has mildly decreased function. The left ventricle demonstrates global hypokinesis. The left ventricular internal cavity size was mildly dilated. Left ventricular diastolic parameters are consistent with Grade III diastolic dysfunction (restrictive).  2. Right ventricular systolic function is normal. The right ventricular size is normal.  3. The pericardial effusion is circumferential. Large pleural effusion.  4. The mitral valve is normal in structure. Trivial mitral valve regurgitation. No evidence of mitral stenosis.  5. The aortic valve is normal in structure. Aortic valve regurgitation is not visualized. No aortic stenosis is present.  6. The inferior vena cava is normal in size with greater than 50% respiratory variability, suggesting right atrial pressure of 3 mmHg. FINDINGS  Left Ventricle: Left ventricular ejection fraction, by estimation, is 40 to 45%. The left ventricle has mildly decreased function. The left ventricle demonstrates global hypokinesis. The left ventricular internal cavity size was mildly dilated. There is  no left ventricular hypertrophy. Left ventricular diastolic parameters are consistent with Grade III diastolic dysfunction (restrictive). Right Ventricle: The right ventricular size is normal. No increase in right ventricular wall thickness. Right ventricular systolic function is normal. Left Atrium: Left atrial size was normal in size. Right Atrium: Right atrial size was  normal in size. Pericardium: Trivial pericardial effusion is present. The pericardial effusion is circumferential. Mitral Valve: The mitral valve is normal in structure. There is moderate calcification of the mitral valve leaflet(s). Normal mobility of the mitral valve leaflets. Trivial mitral valve regurgitation. No evidence of mitral valve stenosis. MV peak gradient, 7.0 mmHg. The mean mitral valve gradient is 2.0 mmHg. Tricuspid  Valve: The tricuspid valve is normal in structure. Tricuspid valve regurgitation is trivial. No evidence of tricuspid stenosis. Aortic Valve: The aortic valve is normal in structure. Aortic valve regurgitation is not visualized. No aortic stenosis is present. Aortic valve mean gradient measures 4.0 mmHg. Aortic valve peak gradient measures 8.0 mmHg. Aortic valve area, by VTI measures 3.06 cm. Pulmonic Valve: The pulmonic valve was normal in structure. Pulmonic valve regurgitation is trivial. No evidence of pulmonic stenosis. Aorta: The aortic root is normal in size and structure. Venous: The inferior vena cava is normal in size with greater than 50% respiratory variability, suggesting right atrial pressure of 3 mmHg. IAS/Shunts: No atrial level shunt detected by color flow Doppler. Additional Comments: There is a large pleural effusion.  LEFT VENTRICLE PLAX 2D LVIDd:         5.31 cm  Diastology LVIDs:         4.16 cm  LV e' lateral:   4.68 cm/s LV PW:         1.21 cm  LV E/e' lateral: 22.6 LV IVS:        1.05 cm  LV e' medial:    4.90 cm/s LVOT diam:     2.10 cm  LV E/e' medial:  21.6 LV SV:         65 LV SV Index:   35 LVOT Area:     3.46 cm  LEFT ATRIUM             Index LA diam:        4.40 cm 2.32 cm/m LA Vol (A2C):   92.5 ml 48.84 ml/m LA Vol (A4C):   94.6 ml 49.94 ml/m LA Biplane Vol: 95.8 ml 50.58 ml/m  AORTIC VALVE                   PULMONIC VALVE AV Area (Vmax):    2.58 cm    PV Vmax:       0.94 m/s AV Area (Vmean):   2.65 cm    PV Vmean:      61.100 cm/s AV Area (VTI):      3.06 cm    PV VTI:        0.172 m AV Vmax:           141.00 cm/s PV Peak grad:  3.5 mmHg AV Vmean:          88.200 cm/s PV Mean grad:  2.0 mmHg AV VTI:            0.214 m AV Peak Grad:      8.0 mmHg AV Mean Grad:      4.0 mmHg LVOT Vmax:         105.00 cm/s LVOT Vmean:        67.400 cm/s LVOT VTI:          0.189 m LVOT/AV VTI ratio: 0.88  AORTA Ao Root diam: 3.00 cm MITRAL VALVE MV Area (PHT): 2.56 cm     SHUNTS MV Peak grad:  7.0 mmHg     Systemic VTI:  0.19 m MV Mean grad:  2.0 mmHg     Systemic Diam: 2.10 cm MV Vmax:       1.32 m/s MV Vmean:      69.2 cm/s MV Decel Time: 296 msec MV E velocity: 106.00 cm/s MV A velocity: 65.50 cm/s MV E/A ratio:  1.62 Neoma Laming MD Electronically signed by Neoma Laming MD Signature Date/Time: 09/15/2019/9:58:20 AM  Final          ASSESSMENT AND PLAN SYNOPSIS  Severe COVID-19 infection, pneumonia/pneumonitis Continue IV steroids  Completed  remdisivir Aggressive pulm toilet recommended OOB to chair as tolerated  Pulmonary hygiene Continue proning as tolerated due to severe hypoxia   Maintain airborne and contact precautions  As needed bronchodilators (MDI) Vitamin C and zinc Antitussives fio2 at 35%, wean off high flow Lasix as tolerated  ACUTE SYSTOLIC CARDIAC FAILURE- EF 40% -oxygen as needed -Lasix as tolerated -follow up cardiac enzymes as indicated -follow up cardiology recs   Remains SD status    Maretta Bees Patricia Pesa, M.D.  [facility]  Medical Director Tony Director [place] Department

## 2019-09-15 NOTE — Progress Notes (Signed)
SUBJECTIVE: No acute events overnight. Patient seen this morning stating mild improvement with her breathing, tolerating being off the BiPAP and on HFNC. Denies chest pain. Remains bradycardic, but asymptomatic.    Vitals:   09/15/19 0700 09/15/19 0800 09/15/19 0900 09/15/19 1000  BP: 119/83 (!) 98/46 102/70   Pulse: (!) 48 (!) 52 60 (!) 54  Resp: 17 20 20 15   Temp:      TempSrc:      SpO2: 100% 98% 98% 95%  Weight:      Height:        Intake/Output Summary (Last 24 hours) at 09/15/2019 1135 Last data filed at 09/15/2019 1000 Gross per 24 hour  Intake 370 ml  Output 400 ml  Net -30 ml    LABS: Basic Metabolic Panel: Recent Labs    09/14/19 1251 09/15/19 0401  NA 130* 134*  K 4.2 3.9  CL 83* 84*  CO2 36* 40*  GLUCOSE 119* 75  BUN 20 27*  CREATININE 0.50 0.52  CALCIUM 8.2* 8.1*  MG  --  2.1  PHOS  --  3.3   Liver Function Tests: No results for input(s): AST, ALT, ALKPHOS, BILITOT, PROT, ALBUMIN in the last 72 hours. No results for input(s): LIPASE, AMYLASE in the last 72 hours. CBC: Recent Labs    09/13/19 1345 09/13/19 1345 09/14/19 0350 09/15/19 0401  WBC 15.3*   < > 7.8 9.6  NEUTROABS 13.8*  --   --   --   HGB 13.5   < > 11.6* 11.3*  HCT 43.0   < > 36.9 35.0*  MCV 93.9   < > 92.9 91.1  PLT 181   < > 144* 147*   < > = values in this interval not displayed.   Cardiac Enzymes: No results for input(s): CKTOTAL, CKMB, CKMBINDEX, TROPONINI in the last 72 hours. BNP: Invalid input(s): POCBNP D-Dimer: No results for input(s): DDIMER in the last 72 hours. Hemoglobin A1C: No results for input(s): HGBA1C in the last 72 hours. Fasting Lipid Panel: No results for input(s): CHOL, HDL, LDLCALC, TRIG, CHOLHDL, LDLDIRECT in the last 72 hours. Thyroid Function Tests: No results for input(s): TSH, T4TOTAL, T3FREE, THYROIDAB in the last 72 hours.  Invalid input(s): FREET3 Anemia Panel: No results for input(s): VITAMINB12, FOLATE, FERRITIN, TIBC, IRON, RETICCTPCT in  the last 72 hours.   PHYSICAL EXAM General: Well developed, well nourished, in no acute distress HEENT:  Normocephalic and atramatic Neck:  No JVD.  Lungs: Clear bilaterally to auscultation and percussion. Heart: HRRR . Normal S1 and S2 without gallops or murmurs.  Abdomen: Bowel sounds are positive, abdomen soft and non-tender  Msk:  Back normal, normal gait. Normal strength and tone for age. Extremities: No clubbing, cyanosis or edema.   Neuro: Alert and oriented X 3. Psych:  Good affect, responds appropriately  TELEMETRY: SB 40-50s  ASSESSMENT AND PLAN: Patient's echo results with EF 40-45% and LV global hypokinesis and grade III diastolic dysfunction. In the setting of her CHF exacerbation, patient remains bradycardic and we will continue to hold beta blockers. Please continue IV lasix 40mg  daily and will also add spironolactone 25mg  daily. Acute respiratory failure most likely d/t CHF exacerbation, with resolved respiratory acidosis. Continue to titrate down O2 as tolerated. Will continue to monitor her BP and add on ACEi, ARB, or ARNi as tolerated. Will continue to follow.  Principal Problem:   Acute on chronic respiratory failure with hypoxia and hypercapnia (HCC) Active Problems:   DM (diabetes mellitus),  type 2 ([place])   Schizoaffective disorder ([place])   Essential hypertension   Schizoaffective disorder, bipolar type ([place])   Obesity, Class III, BMI 40-49.9 (morbid obesity) ([place])   Acute on chronic respiratory failure with hypercapnia ([place])   Leukocytosis    Natalie Sill, NP-C 09/15/2019 11:35 AM

## 2019-09-16 LAB — BASIC METABOLIC PANEL
Anion gap: 9 (ref 5–15)
BUN: 26 mg/dL — ABNORMAL HIGH (ref 6–20)
CO2: 38 mmol/L — ABNORMAL HIGH (ref 22–32)
Calcium: 8.2 mg/dL — ABNORMAL LOW (ref 8.9–10.3)
Chloride: 84 mmol/L — ABNORMAL LOW (ref 98–111)
Creatinine, Ser: 0.55 mg/dL (ref 0.44–1.00)
GFR calc Af Amer: >60 mL/min (ref >60)
GFR calc non Af Amer: >60 mL/min (ref >60)
Glucose, Bld: 146 mg/dL — ABNORMAL HIGH (ref 70–99)
Potassium: 4.4 mmol/L (ref 3.5–5.1)
Sodium: 131 mmol/L — ABNORMAL LOW (ref 135–145)

## 2019-09-16 LAB — GLUCOSE, CAPILLARY
Glucose-Capillary: 105 mg/dL — ABNORMAL HIGH (ref 70–99)
Glucose-Capillary: 106 mg/dL — ABNORMAL HIGH (ref 70–99)
Glucose-Capillary: 185 mg/dL — ABNORMAL HIGH (ref 70–99)

## 2019-09-16 MED ORDER — DEXAMETHASONE 4 MG PO TABS: 6.0000 mg | ORAL_TABLET | Freq: Every day | ORAL | Status: DC

## 2019-09-16 MED ORDER — ZINC SULFATE 220 (50 ZN) MG PO CAPS: 220.0000 mg | ORAL_CAPSULE | Freq: Every day | ORAL | Status: DC

## 2019-09-16 MED ORDER — SPIRONOLACTONE 25 MG PO TABS: 25.0000 mg | ORAL_TABLET | Freq: Every day | ORAL | Status: DC

## 2019-09-16 MED ORDER — LORAZEPAM 1 MG PO TABS
1.0000 mg | ORAL_TABLET | Freq: Four times a day (QID) | ORAL | Status: DC | PRN
  Administered 2019-09-16: 1 mg via ORAL
  Filled 2019-09-16: qty 1

## 2019-09-16 MED ORDER — FUROSEMIDE 40 MG PO TABS: 40.0000 mg | ORAL_TABLET | Freq: Every day | ORAL | Status: DC

## 2019-09-16 MED ORDER — ASCORBIC ACID 1000 MG PO TABS: 1000.0000 mg | ORAL_TABLET | Freq: Every day | ORAL | Status: AC

## 2019-09-16 NOTE — Progress Notes (Signed)
SUBJECTIVE: Patient weaned down to baseline oxygen overnight. No acute events. Patients states her breathing is much better and denies any chest pain.   Vitals:   09/16/19 0819 09/16/19 0900 09/16/19 1000 09/16/19 1100  BP:  116/80 115/66 113/69  Pulse: 68 70 67 66  Resp: 18 19 (!) 21 20  Temp: 98.3 F (36.8 C)     TempSrc: Oral     SpO2: 97% 96% 97% 95%  Weight:      Height:        Intake/Output Summary (Last 24 hours) at 09/16/2019 1317 Last data filed at 09/16/2019 1300 Gross per 24 hour  Intake 1530 ml  Output 3800 ml  Net -2270 ml    LABS: Basic Metabolic Panel: Recent Labs    09/15/19 0401 09/16/19 0630  NA 134* 131*  K 3.9 4.4  CL 84* 84*  CO2 40* 38*  GLUCOSE 75 146*  BUN 27* 26*  CREATININE 0.52 0.55  CALCIUM 8.1* 8.2*  MG 2.1  --   PHOS 3.3  --    Liver Function Tests: No results for input(s): AST, ALT, ALKPHOS, BILITOT, PROT, ALBUMIN in the last 72 hours. No results for input(s): LIPASE, AMYLASE in the last 72 hours. CBC: Recent Labs    09/13/19 1345 09/13/19 1345 09/14/19 0350 09/15/19 0401  WBC 15.3*   < > 7.8 9.6  NEUTROABS 13.8*  --   --   --   HGB 13.5   < > 11.6* 11.3*  HCT 43.0   < > 36.9 35.0*  MCV 93.9   < > 92.9 91.1  PLT 181   < > 144* 147*   < > = values in this interval not displayed.   Cardiac Enzymes: No results for input(s): CKTOTAL, CKMB, CKMBINDEX, TROPONINI in the last 72 hours. BNP: Invalid input(s): POCBNP D-Dimer: No results for input(s): DDIMER in the last 72 hours. Hemoglobin A1C: No results for input(s): HGBA1C in the last 72 hours. Fasting Lipid Panel: No results for input(s): CHOL, HDL, LDLCALC, TRIG, CHOLHDL, LDLDIRECT in the last 72 hours. Thyroid Function Tests: No results for input(s): TSH, T4TOTAL, T3FREE, THYROIDAB in the last 72 hours.  Invalid input(s): FREET3 Anemia Panel: No results for input(s): VITAMINB12, FOLATE, FERRITIN, TIBC, IRON, RETICCTPCT in the last 72 hours.   PHYSICAL EXAM General:  Well developed, well nourished, in no acute distress HEENT:  Normocephalic and atramatic Neck:  No JVD.  Lungs: Clear bilaterally to auscultation and percussion. Heart: HRRR . Normal S1 and S2 without gallops or murmurs.  Abdomen: Bowel sounds are positive, abdomen soft and non-tender  Msk:  Back normal, normal gait. Normal strength and tone for age. Extremities: No clubbing, cyanosis or edema.   Neuro: Alert and oriented X 3. Psych:  Good affect, responds appropriately  TELEMETRY: NSR 70s  ASSESSMENT AND PLAN: Bradycardia resolved. Continue the patient on spironolactone and switch IV lasix to oral dose. If patient's HR and BP can tolerate would recommend starting the patient back on metoprolol succinate at 12.5mg  and slowly titrate up as tolerated. Patient is stable from a cardiac point of view and can be discharged. Please schedule f/u appt at [facility] on 4/6 at 1130am.  Principal Problem:   Acute on chronic respiratory failure with hypoxia and hypercapnia (HCC) Active Problems:   DM (diabetes mellitus), type 2 (HCC)   Schizoaffective disorder (HCC)   Essential hypertension   Schizoaffective disorder, bipolar type ([place])   Obesity, Class III, BMI 40-49.9 (morbid obesity) ([place])  Acute on chronic respiratory failure with hypercapnia ([place])   Leukocytosis    Adaline Sill, NP-C 09/16/2019 1:17 PM

## 2019-09-16 NOTE — Discharge Summary (Signed)
Physician Discharge Summary  Natalie Thomas [id number] DOB: 1960-07-31 DOA: 09/13/2019  PCP: System, Pcp Not In  Admit date: 09/13/2019 Discharge date: 09/16/2019  Admitted From: SNF Disposition: SNF  Recommendations for Outpatient Follow-up:  1. Follow up with PCP in 1-2 weeks 2. Follow-up with cardiology in 1 week. 3. Please obtain BMP/CBC in one week 4. Please follow up on the following pending results: None  Home Health: No Equipment/Devices: Home oxygen at 4L. 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 She was brought into the ER by EMS and was said to have a pulse oximetry of 84% on her home 2liters of oxygen.She arrived on CPAP and had to be placed on a BiPAP, thought to be more due to CHF exacerbation.  Recent COVID-19 infection can be another factor.  Elevated BNP and chest x-ray with diffuse bilateral interstitial opacities and small bilateral effusions suspicious for pulmonary edema.  She was initially treated with IV Lasix.  Cardiology was consulted and they increase the home dose of Lasix from 20 to 40 mg daily and added spironolactone 25 mg daily.  She will follow-up with cardiology for further management of her heart failure medications.  Repeat echocardiogram with 40 to 45% EF and global hypokinesia along with grade 3 diastolic  dysfunction. Patient did developed bradycardia with heart rate in 40s, mostly remained asymptomatic.  Home dose of metoprolol was held with some improvement in her heart rate.  She will keep holding metoprolol until she will see her cardiologist and then they can restart if needed. She was initially transitioned to HFNC from BiPAP, and then to 4 L with good saturation.  Patient will continue on 4 L and follow-up with her PCP and cardiologist for further management. She completed a course of Decadron.  She will continue her home medications for diabetes and hypertension.  Patient is on multiple psych meds which were continued during her current hospitalization and she will follow up with her psychiatrist as an outpatient.  Patient has an history of chronic hyponatremia which were worked up in the past as psychogenic polydipsia versus SIADH.  Sodium stable on discharge and she will continue home dose of Florinef and water restrictions to 1.5 L.  Discharge Diagnoses:  Principal Problem:   Acute on chronic respiratory failure with hypoxia and hypercapnia (HCC) Active Problems:   DM (diabetes mellitus), type 2 (HCC)   Schizoaffective disorder (HCC)   Essential hypertension   Schizoaffective disorder, bipolar type (HCC)   Obesity, Class III, BMI 40-49.9 (morbid obesity) ([place])   Acute on chronic respiratory failure with hypercapnia (HCC)   Leukocytosis  Discharge Instructions  Discharge Instructions    (HEART FAILURE PATIENTS) Call MD:  Anytime you have any of the following symptoms: 1) 3 pound weight gain in 24 hours or 5 pounds in 1 week 2) shortness of breath, with or without a dry hacking cough 3) swelling in the hands, feet or stomach 4) if you have to sleep on extra pillows at night in order to breathe.   Complete by: As directed  Diet - low sodium heart healthy   Complete by: As directed    Discharge instructions   Complete by: As directed    It was pleasure taking care of  you. Please continue using oxygen at 4L. We make some changes to your medications, we are holding metoprolol as your heart rate was low, we increased the dose of Lasix and add spironolactone per your cardiologist request.  Please take all the medications as directed and follow-up with your cardiologist for further management of your heart failure. Please restrict your fluid to 1.5 L/day.   Increase activity slowly   Complete by: As directed      Allergies as of 09/16/2019      Reactions   Clarithromycin Itching   Penicillins Itching   Has tolerated cefazolin before Has patient had a PCN reaction causing immediate rash, facial/tongue/throat swelling, SOB or lightheadedness with hypotension: No Has patient had a PCN reaction causing severe rash involving mucus membranes or skin necrosis: No Has patient had a PCN reaction that required hospitalization: Unknown Has patient had a PCN reaction occurring within the last 10 years: No If all of the above answers are "NO", then may proceed with Cephalosporin use.      Medication List    STOP taking these medications   dexamethasone 6 MG tablet Commonly known as: DECADRON   ipratropium-albuterol 0.5-2.5 (3) MG/3ML Soln Commonly known as: DUONEB   metoprolol tartrate 25 MG tablet Commonly known as: LOPRESSOR     TAKE these medications   acetaminophen 500 MG tablet Commonly known as: TYLENOL Take 1,000 mg by mouth every 8 (eight) hours as needed for mild pain or moderate pain. Not to exceed 3g/24h of tylenol from all sources.   albuterol 108 (90 Base) MCG/ACT inhaler Commonly known as: VENTOLIN HFA Inhale 2 puffs into the lungs every 6 (six) hours as needed for wheezing or shortness of breath.   albuterol (2.5 MG/3ML) 0.083% nebulizer solution Commonly known as: PROVENTIL Take 3 mLs (2.5 mg total) by nebulization every 4 (four) hours as needed for wheezing or shortness of breath.   ANTACID LIQUID PO Take 30 mLs by mouth every 6 (six)  hours as needed (heartburn).   ascorbic acid 1000 MG tablet Commonly known as: VITAMIN C Take 1 tablet (1,000 mg total) by mouth daily. Start taking on: September 17, 2019   Breo Ellipta 100-25 MCG/INH Aepb Generic drug: fluticasone furoate-vilanterol Inhale 1 puff into the lungs daily.   buPROPion 150 MG 24 hr tablet Commonly known as: WELLBUTRIN XL Take 150 mg by mouth daily.   Calcium 600+D 600-800 MG-UNIT Tabs Generic drug: Calcium Carb-Cholecalciferol Take 1 tablet by mouth daily.   clonazePAM 1 MG tablet Commonly known as: KLONOPIN Take 1 tablet (1 mg total) by mouth 2 (two) times daily.   docusate sodium 100 MG capsule Commonly known as: COLACE Take 100 mg by mouth 2 (two) times daily.   feeding supplement (ENSURE ENLIVE) Liqd Take 237 mLs by mouth daily.   fludrocortisone 0.1 MG tablet Commonly known as: FLORINEF Take 1 tablet (0.1 mg total) by mouth daily.   fluticasone 50 MCG/ACT nasal spray Commonly known as: FLONASE Place 2 sprays into both nostrils 2 (two) times daily.   furosemide 40 MG tablet Commonly known as: LASIX Take 1 tablet (40 mg total) by mouth daily. What changed:   medication strength  how much to take   guaiFENesin 600 MG 12 hr tablet Commonly known as: MUCINEX Take 1,200 mg by mouth  daily.   hydrOXYzine 25 MG tablet Commonly known as: ATARAX/VISTARIL Take 25 mg by mouth every 6 (six) hours as needed for anxiety or itching.   loperamide 2 MG capsule Commonly known as: IMODIUM Take 2 mg by mouth every 6 (six) hours as needed for diarrhea or loose stools.   loratadine 10 MG tablet Commonly known as: CLARITIN Take 10 mg by mouth daily.   lurasidone 20 MG Tabs tablet Commonly known as: LATUDA Take 1 tablet (20 mg total) by mouth 2 (two) times daily.   melatonin 5 MG Tabs Take 5 mg by mouth at bedtime.   multivitamin with minerals Tabs tablet Take 1 tablet by mouth daily.   neomycin-bacitracin-polymyxin ointment Commonly  known as: NEOSPORIN Apply 1 application topically as needed for wound care.   nicotine 21 mg/24hr patch Commonly known as: NICODERM CQ - dosed in mg/24 hours Place 21 mg onto the skin daily.   NIFEdipine 90 MG 24 hr tablet Commonly known as: PROCARDIA XL/NIFEDICAL-XL Take 90 mg by mouth daily.   nystatin powder Generic drug: nystatin Apply 1 application topically 2 (two) times daily as needed (irritation). Apply topically to reddened area twice daily as needed for irritation   OLANZapine 10 MG tablet Commonly known as: ZYPREXA Take 10 mg by mouth 2 (two) times daily.   ondansetron 8 MG tablet Commonly known as: ZOFRAN Take 8 mg by mouth every 6 (six) hours as needed for nausea or vomiting.   pantoprazole 40 MG tablet Commonly known as: Protonix Take 1 tablet (40 mg total) by mouth daily.   polyethylene glycol 17 g packet Commonly known as: MiraLax Take 17 g by mouth daily. What changed: additional instructions   sodium chloride 0.65 % Soln nasal spray Commonly known as: OCEAN Place 2 sprays into both nostrils every 2 (two) hours as needed (dryness, irritation).   spironolactone 25 MG tablet Commonly known as: ALDACTONE Take 1 tablet (25 mg total) by mouth daily. Start taking on: September 17, 2019   sucralfate 1 GM/10ML suspension Commonly known as: CARAFATE Take 10 mLs (1 g total) by mouth 4 (four) times daily -  with meals and at bedtime.   valproic acid 250 MG capsule Commonly known as: DEPAKENE Take 2 capsules (500 mg total) by mouth 3 (three) times daily.   zinc sulfate 220 (50 Zn) MG capsule Take 1 capsule (220 mg total) by mouth daily. Start taking on: September 17, 2019      Follow-up Information    [facility] Follow up on 09/24/2019.   Specialty: Cardiology Why: at 10:00am. Enter through the [place] entrance Contact information: [place]          Allergies  Allergen Reactions  . Clarithromycin Itching  . Penicillins Itching    Has tolerated cefazolin before  Has patient had a PCN reaction causing immediate rash, facial/tongue/throat swelling, SOB or lightheadedness with hypotension: No Has patient had a PCN reaction causing severe rash involving mucus membranes or skin necrosis: No Has patient had a PCN reaction that required hospitalization: Unknown Has patient had a PCN reaction occurring within the last 10 years: No If all of the above answers are "NO", then may proceed with Cephalosporin use.     Consultations:  Cardiology  Pulmonology  Procedures/Studies: CT ANGIO CHEST PE W OR WO CONTRAST  Result Date: 09/13/2019 CLINICAL DATA:  PE suspected. Positive COVID-19 test. EXAM: CT ANGIOGRAPHY CHEST WITH  CONTRAST TECHNIQUE: Multidetector CT imaging of the chest was performed using the standard protocol during bolus administration of intravenous contrast. Multiplanar CT image reconstructions and MIPs were obtained to evaluate the vascular anatomy. CONTRAST:  67mL OMNIPAQUE IOHEXOL 350 MG/ML SOLN COMPARISON:  CT PE study dated May 14, 2018. FINDINGS: Cardiovascular: Contrast injection is sufficient to demonstrate satisfactory opacification of the pulmonary arteries to the segmental level. There is no pulmonary embolus. The main pulmonary artery is dilated measuring approximately 3.4 cm. There is no CT evidence of acute right heart strain. There are atherosclerotic changes of the visualized thoracic aorta without evidence for an aneurysm. There is extensive calcified atherosclerotic disease at the origin of the left subclavian artery. This likely results in high-grade stenosis or near occlusion and can contribute to steal syndrome in the appropriate clinical setting. The heart size is significantly enlarged. There is no pericardial effusion. Coronary artery calcifications are noted. The left atrium is significantly enlarged.  Mediastinum/Nodes: --No mediastinal or hilar lymphadenopathy. --No axillary lymphadenopathy. --No supraclavicular lymphadenopathy. --Normal thyroid gland. --The esophagus is unremarkable Lungs/Pleura: There are moderate to large bilateral pleural effusions. Bibasilar compressive atelectasis is noted. There is interlobular septal thickening bilaterally. There is no large focal infiltrate. Emphysematous changes are noted bilaterally. The trachea is unremarkable. Upper Abdomen: Again noted is extensive atherosclerotic disease involving the abdominal aorta at the level of the celiac axis. 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 Musculoskeletal: Multilevel chronic appearing compression fractures are noted throughout the visualized thoracolumbar spine. These all appear to be essentially stable since 2019. Review of the MIP images confirms the above findings. IMPRESSION: 1. No evidence for acute pulmonary embolus. 2. Moderate to large bilateral pleural effusions with associated bibasilar compressive atelectasis. 3. Interlobular septal thickening, compatible with pulmonary edema. 4. Cardiomegaly. 5. 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 6. Dilated main pulmonary artery which can be seen in patients with elevated pulmonary artery pressures. 7. Multiple additional chronic findings as detailed above. 8. Aortic Atherosclerosis (ICD10-I70.0) and Emphysema (ICD10-J43.9). Electronically Signed   By: Constance Holster M.D.   On: 09/13/2019 16:21   DG Chest Port 1 View  Result Date: 09/13/2019 CLINICAL DATA:  Recent discharge from hospital, now on CPAP with increasing low sats and SOB EXAM: PORTABLE CHEST 1 VIEW COMPARISON:  Chest radiograph 09/04/2019 FINDINGS: Stable cardiomediastinal contours with enlarged heart size. There are  diffuse bilateral interstitial opacities. Hazy opacities in the lung bases likely a combination of atelectasis and pleural effusion. No pneumothorax. No acute finding in the visualized skeleton. IMPRESSION: Diffuse bilateral interstitial opacities and small bilateral effusions. Findings most suspicious for pulmonary edema, infection not excluded. Electronically Signed   By: Audie Pinto M.D.   On: 09/13/2019 14:25   DG Chest Port 1 View  Result Date: 09/04/2019 CLINICAL DATA:  Shortness of breath and hypoxia. COVID positive 3 days ago. EXAM: PORTABLE CHEST 1 VIEW COMPARISON:  Radiograph 07/09/2019 FINDINGS: Cardiomegaly. Unchanged mediastinal contours with aortic atherosclerosis. Hazy opacities at both lung bases consistent with pleural effusions, new from prior exam. There is probable fluid in the fissures bilaterally. Patchy bilateral airspace opacities as well as interstitial thickening. Compression fractures in the thoracic spine which are chronic based on 05/14/2018 chest CT. IMPRESSION: 1. Cardiomegaly. Hazy opacities at the lung bases consistent with pleural effusions. 2. Bilateral patchy opacities as well as interstitial  thickening. Suspect combination of pneumonia and pulmonary edema given patient is recently COVID positive. Aortic Atherosclerosis (ICD10-I70.0). Electronically Signed   By: Keith Rake M.D.   On: 09/04/2019 12:50   ECHOCARDIOGRAM COMPLETE  Result Date: 09/15/2019    ECHOCARDIOGRAM REPORT   Patient Name:   HARRIS MCQUARY Date of Exam: 09/14/2019 Medical Rec #:  [id number]          Height:       60.0 in Accession #:    [id number]         Weight:       207.0 lb Date of Birth:  06-22-1961           BSA:          1.894 m Patient Age:    15 years           BP:           110/59 mmHg Patient Gender: F                  HR:           51 bpm. Exam Location:  [facility] Procedure: 2D Echo, Color Doppler and Cardiac Doppler Indications:     I50.9 Congestive Heart Failure  History:          Patient has prior history of Echocardiogram examinations. COPD;                  Risk Factors:Hypertension, Dyslipidemia and Diabetes. Pt tested                  positive for covid19 on 09/07/2019.  Sonographer:     Charmayne Sheer RDCS (AE) Referring Phys:  [id number] [place] Diagnosing Phys: Neoma Laming MD  Sonographer Comments: Suboptimal apical window and suboptimal subcostal window. IMPRESSIONS  1. Left ventricular ejection fraction, by estimation, is 40 to 45%. The left ventricle has mildly decreased function. The left ventricle demonstrates global hypokinesis. The left ventricular internal cavity size was mildly dilated. Left ventricular diastolic parameters are consistent with Grade III diastolic dysfunction (restrictive).  2. Right ventricular systolic function is normal. The right ventricular size is normal.  3. The pericardial effusion is circumferential. Large pleural effusion.  4. The mitral valve is normal in structure. Trivial mitral valve regurgitation. No evidence of mitral stenosis.  5. The aortic valve is normal in structure. Aortic valve regurgitation is not visualized. No aortic stenosis is present.  6. The inferior vena cava is normal in size with greater than 50% respiratory variability, suggesting right atrial pressure of 3 mmHg. FINDINGS  Left Ventricle: Left ventricular ejection fraction, by estimation, is 40 to 45%. The left ventricle has mildly decreased function. The left ventricle demonstrates global hypokinesis. The left ventricular internal cavity size was mildly dilated. There is  no left ventricular hypertrophy. Left ventricular diastolic parameters are consistent with Grade III diastolic dysfunction (restrictive). Right Ventricle: The right ventricular size is normal. No increase in right ventricular wall thickness. Right ventricular systolic function is normal. Left Atrium: Left atrial size was normal in size. Right Atrium: Right atrial size was normal in size. Pericardium:  Trivial pericardial effusion is present. The pericardial effusion is circumferential. Mitral Valve: The mitral valve is normal in structure. There is moderate calcification of the mitral valve leaflet(s). Normal mobility of the mitral valve leaflets. Trivial mitral valve regurgitation. No evidence of mitral valve stenosis. MV peak gradient, 7.0 mmHg. The mean mitral valve gradient is 2.0 mmHg. Tricuspid Valve: The  tricuspid valve is normal in structure. Tricuspid valve regurgitation is trivial. No evidence of tricuspid stenosis. Aortic Valve: The aortic valve is normal in structure. Aortic valve regurgitation is not visualized. No aortic stenosis is present. Aortic valve mean gradient measures 4.0 mmHg. Aortic valve peak gradient measures 8.0 mmHg. Aortic valve area, by VTI measures 3.06 cm. Pulmonic Valve: The pulmonic valve was normal in structure. Pulmonic valve regurgitation is trivial. No evidence of pulmonic stenosis. Aorta: The aortic root is normal in size and structure. Venous: The inferior vena cava is normal in size with greater than 50% respiratory variability, suggesting right atrial pressure of 3 mmHg. IAS/Shunts: No atrial level shunt detected by color flow Doppler. Additional Comments: There is a large pleural effusion.  LEFT VENTRICLE PLAX 2D LVIDd:         5.31 cm  Diastology LVIDs:         4.16 cm  LV e' lateral:   4.68 cm/s LV PW:         1.21 cm  LV E/e' lateral: 22.6 LV IVS:        1.05 cm  LV e' medial:    4.90 cm/s LVOT diam:     2.10 cm  LV E/e' medial:  21.6 LV SV:         65 LV SV Index:   35 LVOT Area:     3.46 cm  LEFT ATRIUM             Index LA diam:        4.40 cm 2.32 cm/m LA Vol (A2C):   92.5 ml 48.84 ml/m LA Vol (A4C):   94.6 ml 49.94 ml/m LA Biplane Vol: 95.8 ml 50.58 ml/m  AORTIC VALVE                   PULMONIC VALVE AV Area (Vmax):    2.58 cm    PV Vmax:       0.94 m/s AV Area (Vmean):   2.65 cm    PV Vmean:      61.100 cm/s AV Area (VTI):     3.06 cm    PV VTI:         0.172 m AV Vmax:           141.00 cm/s PV Peak grad:  3.5 mmHg AV Vmean:          88.200 cm/s PV Mean grad:  2.0 mmHg AV VTI:            0.214 m AV Peak Grad:      8.0 mmHg AV Mean Grad:      4.0 mmHg LVOT Vmax:         105.00 cm/s LVOT Vmean:        67.400 cm/s LVOT VTI:          0.189 m LVOT/AV VTI ratio: 0.88  AORTA Ao Root diam: 3.00 cm MITRAL VALVE MV Area (PHT): 2.56 cm     SHUNTS MV Peak grad:  7.0 mmHg     Systemic VTI:  0.19 m MV Mean grad:  2.0 mmHg     Systemic Diam: 2.10 cm MV Vmax:       1.32 m/s MV Vmean:      69.2 cm/s MV Decel Time: 296 msec MV E velocity: 106.00 cm/s MV A velocity: 65.50 cm/s MV E/A ratio:  1.62 Neoma Laming MD Electronically signed by Neoma Laming MD Signature Date/Time: 09/15/2019/9:58:20 AM    Final  Subjective: Patient was feeling better when seen today.  No new complaints.  Discharge Exam: Vitals:   09/16/19 0900 09/16/19 1000  BP: 116/80 115/66  Pulse: 70 67  Resp: 19 (!) 21  Temp:    SpO2: 96% 97%   Vitals:   09/16/19 0800 09/16/19 0819 09/16/19 0900 09/16/19 1000  BP: 127/86  116/80 115/66  Pulse: 73 68 70 67  Resp: 14 18 19  (!) 21  Temp:  98.3 F (36.8 C)    TempSrc:  Oral    SpO2: 95% 97% 96% 97%  Weight:      Height:        General: Pt is alert, awake, not in acute distress Cardiovascular: RRR, S1/S2 +, no rubs, no gallops Respiratory: CTA bilaterally, no wheezing, no rhonchi Abdominal: Soft, NT, ND, bowel sounds + Extremities: no edema, no cyanosis   The results of significant diagnostics from this hospitalization (including imaging, microbiology, ancillary and laboratory) are listed below for reference.    Microbiology: Recent Results (from the past 240 hour(s))  Blood culture (routine x 2)     Status: None (Preliminary result)   Collection Time: 09/13/19  1:45 PM   Specimen: BLOOD  Result Value Ref Range Status   Specimen Description BLOOD BLOOD RIGHT FOREARM  Final   Special Requests   Final    BOTTLES DRAWN AEROBIC  AND ANAEROBIC Blood Culture adequate volume   Culture   Final    NO GROWTH 3 DAYS Performed at [facility], [address] [place], [place] [address]    Report Status PENDING  Incomplete  Blood culture (routine x 2)     Status: None (Preliminary result)   Collection Time: 09/13/19  1:53 PM   Specimen: BLOOD  Result Value Ref Range Status   Specimen Description BLOOD BLOOD LEFT HAND  Final   Special Requests   Final    BOTTLES DRAWN AEROBIC AND ANAEROBIC Blood Culture results may not be optimal due to an inadequate volume of blood received in culture bottles   Culture   Final    NO GROWTH 3 DAYS Performed at [facility], [address], [place], [place] [address]    Report Status PENDING  Incomplete  Respiratory Panel by RT PCR (Flu A&B, Covid) - Nasopharyngeal Swab     Status: None   Collection Time: 09/13/19  4:35 PM   Specimen: Nasopharyngeal Swab  Result Value Ref Range Status   SARS Coronavirus 2 by RT PCR NEGATIVE NEGATIVE Final    Comment: (NOTE) SARS-CoV-2 target nucleic acids are NOT DETECTED. The SARS-CoV-2 RNA is generally detectable in upper respiratoy specimens during the acute phase of infection. The lowest concentration of SARS-CoV-2 viral copies this assay can detect is 131 copies/mL. A negative result does not preclude SARS-Cov-2 infection and should not be used as the sole basis for treatment or other patient management decisions. A negative result may occur with  improper specimen collection/handling, submission of specimen other than nasopharyngeal swab, presence of viral mutation(s) within the areas targeted by this assay, and inadequate number of viral copies (<131 copies/mL). A negative result must be combined with clinical observations, patient history, and epidemiological information. The expected result is Negative. Fact Sheet for Patients:  PinkCheek.be Fact Sheet for Healthcare Providers:   GravelBags.it This test is not yet ap proved or cleared by the [place] FDA and  has been authorized for detection and/or diagnosis of SARS-CoV-2 by FDA under an Emergency Use Authorization (EUA). This EUA  will remain  in effect (meaning this test can be used) for the duration of the COVID-19 declaration under Section 564(b)(1) of the Act, 21 U.S.C. section 360bbb-3(b)(1), unless the authorization is terminated or revoked sooner.    Influenza A by PCR NEGATIVE NEGATIVE Final   Influenza B by PCR NEGATIVE NEGATIVE Final    Comment: (NOTE) The Xpert Xpress SARS-CoV-2/FLU/RSV assay is intended as an aid in  the diagnosis of influenza from Nasopharyngeal swab specimens and  should not be used as a sole basis for treatment. Nasal washings and  aspirates are unacceptable for Xpert Xpress SARS-CoV-2/FLU/RSV  testing. Fact Sheet for Patients: PinkCheek.be Fact Sheet for Healthcare Providers: GravelBags.it This test is not yet approved or cleared by the [place] FDA and  has been authorized for detection and/or diagnosis of SARS-CoV-2 by  FDA under an Emergency Use Authorization (EUA). This EUA will remain  in effect (meaning this test can be used) for the duration of the  Covid-19 declaration under Section 564(b)(1) of the Act, 21  U.S.C. section 360bbb-3(b)(1), unless the authorization is  terminated or revoked. Performed at [facility], [address] [place], [place] [address]      Labs: BNP (last 3 results) Recent Labs    07/09/19 0726 09/04/19 1413 09/13/19 1345  BNP 178.8* 1,205.0* [id number]*   Basic Metabolic Panel: Recent Labs  Lab 09/13/19 1345 09/14/19 0350 09/14/19 1251 09/15/19 0401 09/16/19 0630  NA 130* 135 130* 134* 131*  K 4.6 4.4 4.2 3.9 4.4  CL 88* 90* 83* 84* 84*  CO2 35* 38* 36* 40* 38*  GLUCOSE 188* 153* 119* 75 146*  BUN 21* 20 20 27* 26*   CREATININE 0.66 0.50 0.50 0.52 0.55  CALCIUM 8.6* 8.2* 8.2* 8.1* 8.2*  MG  --   --   --  2.1  --   PHOS  --   --   --  3.3  --    Liver Function Tests: No results for input(s): AST, ALT, ALKPHOS, BILITOT, PROT, ALBUMIN in the last 168 hours. No results for input(s): LIPASE, AMYLASE in the last 168 hours. No results for input(s): AMMONIA in the last 168 hours. CBC: Recent Labs  Lab 09/13/19 1345 09/14/19 0350 09/15/19 0401  WBC 15.3* 7.8 9.6  NEUTROABS 13.8*  --   --   HGB 13.5 11.6* 11.3*  HCT 43.0 36.9 35.0*  MCV 93.9 92.9 91.1  PLT 181 144* 147*   Cardiac Enzymes: No results for input(s): CKTOTAL, CKMB, CKMBINDEX, TROPONINI in the last 168 hours. BNP: Invalid input(s): POCBNP CBG: Recent Labs  Lab 09/15/19 0835 09/15/19 1231 09/15/19 1721 09/15/19 1956 09/16/19 0734  GLUCAP 89 122* 224* 207* 105*   D-Dimer No results for input(s): DDIMER in the last 72 hours. Hgb A1c No results for input(s): HGBA1C in the last 72 hours. Lipid Profile No results for input(s): CHOL, HDL, LDLCALC, TRIG, CHOLHDL, LDLDIRECT in the last 72 hours. Thyroid function studies No results for input(s): TSH, T4TOTAL, T3FREE, THYROIDAB in the last 72 hours.  Invalid input(s): FREET3 Anemia work up No results for input(s): VITAMINB12, FOLATE, FERRITIN, TIBC, IRON, RETICCTPCT in the last 72 hours. Urinalysis    Component Value Date/Time   COLORURINE YELLOW 07/02/2019 1048   APPEARANCEUR CLEAR 07/02/2019 1048   APPEARANCEUR Clear 06/22/2012 0618   LABSPEC 1.012 07/02/2019 1048   LABSPEC 1.003 06/22/2012 0618   PHURINE 6.0 07/02/2019 1048   GLUCOSEU NEGATIVE 07/02/2019 1048   GLUCOSEU Negative 06/22/2012 0618   GLUCOSEU  NEGATIVE 04/09/2011 [place] 07/02/2019 1048   BILIRUBINUR NEGATIVE 07/02/2019 1048   BILIRUBINUR Negative 06/22/2012 0618   KETONESUR NEGATIVE 07/02/2019 1048   PROTEINUR NEGATIVE 07/02/2019 1048   UROBILINOGEN 1.0 08/18/2011 1805   NITRITE NEGATIVE  07/02/2019 1048   LEUKOCYTESUR NEGATIVE 07/02/2019 1048   LEUKOCYTESUR Trace 06/22/2012 0618   Sepsis Labs Invalid input(s): PROCALCITONIN,  WBC,  LACTICIDVEN Microbiology Recent Results (from the past 240 hour(s))  Blood culture (routine x 2)     Status: None (Preliminary result)   Collection Time: 09/13/19  1:45 PM   Specimen: BLOOD  Result Value Ref Range Status   Specimen Description BLOOD BLOOD RIGHT FOREARM  Final   Special Requests   Final    BOTTLES DRAWN AEROBIC AND ANAEROBIC Blood Culture adequate volume   Culture   Final    NO GROWTH 3 DAYS Performed at [facility], [address], [place], [place] [address]    Report Status PENDING  Incomplete  Blood culture (routine x 2)     Status: None (Preliminary result)   Collection Time: 09/13/19  1:53 PM   Specimen: BLOOD  Result Value Ref Range Status   Specimen Description BLOOD BLOOD LEFT HAND  Final   Special Requests   Final    BOTTLES DRAWN AEROBIC AND ANAEROBIC Blood Culture results may not be optimal due to an inadequate volume of blood received in culture bottles   Culture   Final    NO GROWTH 3 DAYS Performed at [facility], [address], [place], [place] [address]    Report Status PENDING  Incomplete  Respiratory Panel by RT PCR (Flu A&B, Covid) - Nasopharyngeal Swab     Status: None   Collection Time: 09/13/19  4:35 PM   Specimen: Nasopharyngeal Swab  Result Value Ref Range Status   SARS Coronavirus 2 by RT PCR NEGATIVE NEGATIVE Final    Comment: (NOTE) SARS-CoV-2 target nucleic acids are NOT DETECTED. The SARS-CoV-2 RNA is generally detectable in upper respiratoy specimens during the acute phase of infection. The lowest concentration of SARS-CoV-2 viral copies this assay can detect is 131 copies/mL. A negative result does not preclude SARS-Cov-2 infection and should not be used as the sole basis for treatment or other patient management decisions. A negative result may occur with   improper specimen collection/handling, submission of specimen other than nasopharyngeal swab, presence of viral mutation(s) within the areas targeted by this assay, and inadequate number of viral copies (<131 copies/mL). A negative result must be combined with clinical observations, patient history, and epidemiological information. The expected result is Negative. Fact Sheet for Patients:  PinkCheek.be Fact Sheet for Healthcare Providers:  GravelBags.it This test is not yet ap proved or cleared by the [place] FDA and  has been authorized for detection and/or diagnosis of SARS-CoV-2 by FDA under an Emergency Use Authorization (EUA). This EUA will remain  in effect (meaning this test can be used) for the duration of the COVID-19 declaration under Section 564(b)(1) of the Act, 21 U.S.C. section 360bbb-3(b)(1), unless the authorization is terminated or revoked sooner.    Influenza A by PCR NEGATIVE NEGATIVE Final   Influenza B by PCR NEGATIVE NEGATIVE Final    Comment: (NOTE) The Xpert Xpress SARS-CoV-2/FLU/RSV assay is intended as an aid in  the diagnosis of influenza from Nasopharyngeal swab specimens and  should not be used as a sole basis for treatment. Nasal washings and  aspirates are unacceptable for Xpert Xpress SARS-CoV-2/FLU/RSV  testing. Fact Sheet for Patients: PinkCheek.be Fact Sheet for Healthcare Providers: GravelBags.it This test is not yet approved or cleared by the [place] FDA and  has been authorized for detection and/or diagnosis of SARS-CoV-2 by  FDA under an Emergency Use Authorization (EUA). This EUA will remain  in effect (meaning this test can be used) for the duration of the  Covid-19 declaration under Section 564(b)(1) of the Act, 21  U.S.C. section 360bbb-3(b)(1), unless the authorization is  terminated or revoked. Performed at  [facility], [address] [place], [place] [address]     Time coordinating discharge: Over 30 minutes  SIGNED:  Lorella Nimrod, MD  [facility] 09/16/2019, 11:38 AM  If 7PM-7AM, please contact night-coverage [email or web address]  This record has been created using Systems analyst. Errors have been sought and corrected,but may not always be located. Such creation errors do not reflect on the standard of care.

## 2019-09-16 NOTE — Progress Notes (Signed)
IV's d/c, Belongings given to Patient. VSS. Paperwork given to transport. [facility] here to take patient back to [name].

## 2019-09-16 NOTE — Progress Notes (Signed)
Pharmacy Electrolyte Monitoring Consult:  Pharmacy consulted to assist in monitoring and replacing electrolytes in this 59 y.o. female admitted on 09/13/2019 with COVID pneumonia and CHF exacerbation. Patient with past medical history significant for COPD, CHF, Hypertension, Diabetes, and Schizoaffective Disorder.   Labs:  Sodium (mmol/L)  Date Value  09/16/2019 131 (L)  06/23/2012 128 (L)   Potassium (mmol/L)  Date Value  09/16/2019 4.4  06/23/2012 4.3   Magnesium (mg/dL)  Date Value  09/15/2019 2.1  05/14/2012 2.1   Phosphorus (mg/dL)  Date Value  09/15/2019 3.3   Calcium (mg/dL)  Date Value  09/16/2019 8.2 (L)   Calcium, Total (PTH) (mg/dL)  Date Value  04/09/2011 9.3   Calcium, Total (mg/dL)  Date Value  06/23/2012 9.1   Albumin (g/dL)  Date Value  09/08/2019 3.1 (L)  06/22/2012 2.8 (L)    Assessment/Plan: No replacement warranted.  BMP ordered with am labs.   Will replace for goal potassium ~4 and goal magnesium ~ 2.   Pharmacy will continue to monitor and adjust per consult.   Harlene Petralia L 09/16/2019 11:47 AM

## 2019-09-16 NOTE — Progress Notes (Signed)
Pt upset she has to go back to facility today. Myself, CM, and pts Legal Natalie Thomas spoke with her about this. Discharge order placed by MD, report called to[facility] at [facility]

## 2019-09-16 NOTE — TOC Transition Note (Signed)
Transition of Care [facility]) - CM/SW Discharge Note   Patient Details  Name: Natalie Thomas MRN: [id number] Date of Birth: 03-16-1961  [facility] CM/SW Contact:  Shelbie Ammons, RN Phone Number: 09/16/2019, 2:22 PM   Clinical Narrative:   RNCM contacted by nurse and MD about patient being ready for discharge however she does not want to return to facility she came from. Placed call to [place] at [name] and she stated that this is not surprising for patient. Placed call and spoke with patient's guardian Natalie Thomas who reports that patient has to return to [facility] because she there are no other places that will accept her. RNCM received call from patient's bedside nurse that patient was very upset and complaining about going back. RNCM placed additional call to [facility] guardian and requested that she call patient herself which she said she would do. EMS packet completed and taken up to nurse who immediately called for transport. Natalie Thomas returned call to report that she explained situation to patient.     Final next level of care: [place] Barriers to Discharge: Continued Medical Work up   Patient Goals and CMS Choice     Choice offered to / list presented to : [id number] POA / Guardian  Discharge Placement                Patient to be transferred to facility by: [facility]   Patient and family notified of of transfer: 09/16/19  Discharge Plan and Services     Post Acute Care Choice: [place]                               Social Determinants of Health (SDOH) Interventions     Readmission Risk Interventions Readmission Risk Prevention Plan 09/15/2019 09/08/2019 07/10/2019  Transportation Screening Complete Complete Complete  Medication Review Press photographer) Complete Complete Referral to Pharmacy  PCP or Specialist appointment within 3-5 days of discharge Complete Complete Complete  HRI or Home Care Consult Complete  Complete Not Complete  HRI or Home Care Consult Pt Refusal Comments - - pt is resident of [facility]  SW Recovery Care/Counseling Consult - Complete Complete  Palliative Care Screening - Not Applicable Not [place] Complete Complete Not Applicable  Some recent data might be hidden

## 2019-09-17 ENCOUNTER — Ambulatory Visit: Payer: Medicare Other | Admitting: Family

## 2019-09-17 NOTE — Progress Notes (Signed)
As CH was walking by room, pt. called out loudly: 'Am I going somewhere?'  CH consulted pt.'s RN: pt. is going home.  CH relayed this message to pt. which seemed to be a relief to her.  CH enroute to other appointment and did not don PPE to enter pt.'s rm. but needs appear to have been met.    09/16/19 1345  Clinical Encounter Type  Visited With Patient;Health care provider  Visit Type Initial;Critical Care  Referral From Patient   

## 2019-09-18 ENCOUNTER — Telehealth: Payer: Self-pay | Admitting: Family

## 2019-09-18 LAB — CULTURE, BLOOD (ROUTINE X 2)
Culture: NO GROWTH
Culture: NO GROWTH
Special Requests: ADEQUATE

## 2019-09-18 NOTE — Telephone Encounter (Signed)
Spoke with patients nurse at [facility] who told me she is doing well. She is on a low sodium diet, taking medications without issues, has had no symptoms, and they have her on a daily weight. They confirmed her new patient appointment with [place] on 4/1.    Alyse Low, [place]

## 2019-09-22 ENCOUNTER — Inpatient Hospital Stay
Admission: EM | Admit: 2019-09-22 | Discharge: 2019-09-26 | DRG: 291 | Disposition: A | Discharge status: Skilled Nursing Facility | Payer: Medicare Other | Attending: Internal Medicine | Admitting: Internal Medicine

## 2019-09-22 ENCOUNTER — Emergency Department: Payer: Medicare Other

## 2019-09-22 ENCOUNTER — Other Ambulatory Visit: Payer: Self-pay

## 2019-09-22 DIAGNOSIS — Z79899 Other long term (current) drug therapy: Secondary | ICD-10-CM

## 2019-09-22 DIAGNOSIS — Z7951 Long term (current) use of inhaled steroids: Secondary | ICD-10-CM | POA: Diagnosis not present

## 2019-09-22 DIAGNOSIS — I10 Essential (primary) hypertension: Secondary | ICD-10-CM | POA: Diagnosis present

## 2019-09-22 DIAGNOSIS — F259 Schizoaffective disorder, unspecified: Secondary | ICD-10-CM | POA: Diagnosis present

## 2019-09-22 DIAGNOSIS — J90 Pleural effusion, not elsewhere classified: Secondary | ICD-10-CM

## 2019-09-22 DIAGNOSIS — J918 Pleural effusion in other conditions classified elsewhere: Secondary | ICD-10-CM | POA: Diagnosis present

## 2019-09-22 DIAGNOSIS — M810 Age-related osteoporosis without current pathological fracture: Secondary | ICD-10-CM | POA: Diagnosis present

## 2019-09-22 DIAGNOSIS — Z6841 Body Mass Index (BMI) 40.0 and over, adult: Secondary | ICD-10-CM | POA: Diagnosis not present

## 2019-09-22 DIAGNOSIS — F250 Schizoaffective disorder, bipolar type: Secondary | ICD-10-CM | POA: Diagnosis present

## 2019-09-22 DIAGNOSIS — I110 Hypertensive heart disease with heart failure: Principal | ICD-10-CM | POA: Diagnosis present

## 2019-09-22 DIAGNOSIS — E11649 Type 2 diabetes mellitus with hypoglycemia without coma: Secondary | ICD-10-CM | POA: Diagnosis not present

## 2019-09-22 DIAGNOSIS — K219 Gastro-esophageal reflux disease without esophagitis: Secondary | ICD-10-CM | POA: Diagnosis present

## 2019-09-22 DIAGNOSIS — I5043 Acute on chronic combined systolic (congestive) and diastolic (congestive) heart failure: Secondary | ICD-10-CM | POA: Diagnosis present

## 2019-09-22 DIAGNOSIS — Z8616 Personal history of COVID-19: Secondary | ICD-10-CM

## 2019-09-22 DIAGNOSIS — J449 Chronic obstructive pulmonary disease, unspecified: Secondary | ICD-10-CM | POA: Diagnosis present

## 2019-09-22 DIAGNOSIS — R0602 Shortness of breath: Secondary | ICD-10-CM | POA: Diagnosis present

## 2019-09-22 DIAGNOSIS — E1165 Type 2 diabetes mellitus with hyperglycemia: Secondary | ICD-10-CM | POA: Diagnosis present

## 2019-09-22 DIAGNOSIS — J9621 Acute and chronic respiratory failure with hypoxia: Secondary | ICD-10-CM | POA: Diagnosis present

## 2019-09-22 DIAGNOSIS — Z7984 Long term (current) use of oral hypoglycemic drugs: Secondary | ICD-10-CM | POA: Diagnosis not present

## 2019-09-22 DIAGNOSIS — E785 Hyperlipidemia, unspecified: Secondary | ICD-10-CM | POA: Diagnosis present

## 2019-09-22 DIAGNOSIS — Z87891 Personal history of nicotine dependence: Secondary | ICD-10-CM | POA: Diagnosis not present

## 2019-09-22 DIAGNOSIS — J9622 Acute and chronic respiratory failure with hypercapnia: Secondary | ICD-10-CM

## 2019-09-22 DIAGNOSIS — Z9889 Other specified postprocedural states: Secondary | ICD-10-CM

## 2019-09-22 DIAGNOSIS — E119 Type 2 diabetes mellitus without complications: Secondary | ICD-10-CM

## 2019-09-22 DIAGNOSIS — R0902 Hypoxemia: Secondary | ICD-10-CM

## 2019-09-22 LAB — BASIC METABOLIC PANEL
Anion gap: 10 (ref 5–15)
BUN: 18 mg/dL (ref 6–20)
CO2: 34 mmol/L — ABNORMAL HIGH (ref 22–32)
Calcium: 8.9 mg/dL (ref 8.9–10.3)
Chloride: 85 mmol/L — ABNORMAL LOW (ref 98–111)
Creatinine, Ser: 0.71 mg/dL (ref 0.44–1.00)
GFR calc Af Amer: >60 mL/min (ref >60)
GFR calc non Af Amer: >60 mL/min (ref >60)
Glucose, Bld: 138 mg/dL — ABNORMAL HIGH (ref 70–99)
Potassium: 4.9 mmol/L (ref 3.5–5.1)
Sodium: 129 mmol/L — ABNORMAL LOW (ref 135–145)

## 2019-09-22 LAB — CBC
HCT: 39.3 % (ref 36.0–46.0)
HCT: 36.5 % (ref 36.0–46.0)
Hemoglobin: 12.6 g/dL (ref 12.0–15.0)
Hemoglobin: 11.8 g/dL — ABNORMAL LOW (ref 12.0–15.0)
MCH: 29.5 pg (ref 26.0–34.0)
MCH: 29.9 pg (ref 26.0–34.0)
MCHC: 32.1 g/dL (ref 30.0–36.0)
MCHC: 32.3 g/dL (ref 30.0–36.0)
MCV: 92 fL (ref 80.0–100.0)
MCV: 92.4 fL (ref 80.0–100.0)
Platelets: 154 10*3/uL (ref 150–400)
Platelets: 130 10*3/uL — ABNORMAL LOW (ref 150–400)
RBC: 4.27 MIL/uL (ref 3.87–5.11)
RBC: 3.95 MIL/uL (ref 3.87–5.11)
RDW: 14 % (ref 11.5–15.5)
RDW: 14 % (ref 11.5–15.5)
WBC: 11.8 10*3/uL — ABNORMAL HIGH (ref 4.0–10.5)
WBC: 7.3 10*3/uL (ref 4.0–10.5)
nRBC: 0 % (ref 0.0–0.2)
nRBC: 0 % (ref 0.0–0.2)

## 2019-09-22 LAB — URINALYSIS, COMPLETE (UACMP) WITH MICROSCOPIC
Bilirubin Urine: NEGATIVE
Glucose, UA: NEGATIVE mg/dL
Hgb urine dipstick: NEGATIVE
Ketones, ur: NEGATIVE mg/dL
Leukocytes,Ua: NEGATIVE
Nitrite: NEGATIVE
Protein, ur: NEGATIVE mg/dL
Specific Gravity, Urine: 1.006 (ref 1.005–1.030)
pH: 6 (ref 5.0–8.0)

## 2019-09-22 LAB — LACTIC ACID, PLASMA
Lactic Acid, Venous: 1.2 mmol/L (ref 0.5–1.9)
Lactic Acid, Venous: 1.1 mmol/L (ref 0.5–1.9)

## 2019-09-22 LAB — GLUCOSE, CAPILLARY: Glucose-Capillary: 197 mg/dL — ABNORMAL HIGH (ref 70–99)

## 2019-09-22 LAB — BLOOD GAS, VENOUS
Acid-Base Excess: 9.8 mmol/L — ABNORMAL HIGH (ref 0.0–2.0)
Bicarbonate: 39.3 mmol/L — ABNORMAL HIGH (ref 20.0–28.0)
O2 Saturation: 87.5 %
Patient temperature: 37
pCO2, Ven: 78 mmHg (ref 44.0–60.0)
pH, Ven: 7.31 (ref 7.250–7.430)
pO2, Ven: 59 mmHg — ABNORMAL HIGH (ref 32.0–45.0)

## 2019-09-22 LAB — TROPONIN I (HIGH SENSITIVITY)
Troponin I (High Sensitivity): 21 ng/L — ABNORMAL HIGH (ref <18)
Troponin I (High Sensitivity): 19 ng/L — ABNORMAL HIGH (ref <18)

## 2019-09-22 LAB — CREATININE, SERUM
Creatinine, Ser: 0.7 mg/dL (ref 0.44–1.00)
GFR calc Af Amer: >60 mL/min (ref >60)
GFR calc non Af Amer: >60 mL/min (ref >60)

## 2019-09-22 LAB — BRAIN NATRIURETIC PEPTIDE: B Natriuretic Peptide: 624 pg/mL — ABNORMAL HIGH (ref 0.0–100.0)

## 2019-09-22 LAB — RESPIRATORY PANEL BY RT PCR (FLU A&B, COVID)
Influenza A by PCR: NEGATIVE
Influenza B by PCR: NEGATIVE
SARS Coronavirus 2 by RT PCR: NEGATIVE

## 2019-09-22 LAB — PROCALCITONIN: Procalcitonin: <0.1 ng/mL

## 2019-09-22 MED ORDER — ONDANSETRON HCL 4 MG/2ML IJ SOLN: 4.0000 mg | Freq: Four times a day (QID) | INTRAMUSCULAR | Status: DC | PRN

## 2019-09-22 MED ORDER — INSULIN ASPART 100 UNIT/ML ~~LOC~~ SOLN: 0.0000 [IU] | Freq: Every day | SUBCUTANEOUS | Status: DC

## 2019-09-22 MED ORDER — LURASIDONE HCL 40 MG PO TABS
20.0000 mg | ORAL_TABLET | Freq: Two times a day (BID) | ORAL | Status: DC
  Administered 2019-09-22 – 2019-09-26 (×8): 20 mg via ORAL
  Filled 2019-09-22 (×9): qty 1

## 2019-09-22 MED ORDER — OLANZAPINE 10 MG PO TABS
10.0000 mg | ORAL_TABLET | Freq: Two times a day (BID) | ORAL | Status: DC
  Administered 2019-09-22 – 2019-09-26 (×8): 10 mg via ORAL
  Filled 2019-09-22 (×9): qty 1

## 2019-09-22 MED ORDER — ALUM & MAG HYDROXIDE-SIMETH 200-200-20 MG/5ML PO SUSP
30.0000 mL | Freq: Four times a day (QID) | ORAL | Status: DC | PRN
  Administered 2019-09-25: 30 mL via ORAL
  Filled 2019-09-22: qty 30

## 2019-09-22 MED ORDER — FUROSEMIDE 10 MG/ML IJ SOLN
40.0000 mg | Freq: Two times a day (BID) | INTRAMUSCULAR | Status: DC
  Administered 2019-09-23 – 2019-09-24 (×4): 40 mg via INTRAVENOUS
  Filled 2019-09-22 (×4): qty 4

## 2019-09-22 MED ORDER — SALINE SPRAY 0.65 % NA SOLN
2.0000 | NASAL | Status: DC | PRN
  Filled 2019-09-22: qty 44

## 2019-09-22 MED ORDER — FLUTICASONE FUROATE-VILANTEROL 100-25 MCG/INH IN AEPB
1.0000 | INHALATION_SPRAY | Freq: Every day | RESPIRATORY_TRACT | Status: DC
  Administered 2019-09-23 – 2019-09-26 (×4): 1 via RESPIRATORY_TRACT
  Filled 2019-09-22: qty 28

## 2019-09-22 MED ORDER — POLYETHYLENE GLYCOL 3350 17 G PO PACK
17.0000 g | PACK | Freq: Every day | ORAL | Status: DC
  Administered 2019-09-22 – 2019-09-26 (×5): 17 g via ORAL
  Filled 2019-09-22 (×5): qty 1

## 2019-09-22 MED ORDER — FUROSEMIDE 10 MG/ML IJ SOLN
INTRAMUSCULAR | Status: AC
  Administered 2019-09-22: 60 mg via INTRAVENOUS
  Filled 2019-09-22: qty 8

## 2019-09-22 MED ORDER — GUAIFENESIN ER 600 MG PO TB12
1200.0000 mg | ORAL_TABLET | Freq: Every day | ORAL | Status: DC
  Administered 2019-09-22 – 2019-09-26 (×5): 1200 mg via ORAL
  Filled 2019-09-22 (×5): qty 2

## 2019-09-22 MED ORDER — ALBUTEROL SULFATE (2.5 MG/3ML) 0.083% IN NEBU
2.5000 mg | INHALATION_SOLUTION | RESPIRATORY_TRACT | Status: DC | PRN
  Administered 2019-09-23 – 2019-09-24 (×2): 2.5 mg via RESPIRATORY_TRACT
  Filled 2019-09-22 (×2): qty 3

## 2019-09-22 MED ORDER — MORPHINE SULFATE (PF) 2 MG/ML IV SOLN
2.0000 mg | INTRAVENOUS | Status: DC | PRN
  Administered 2019-09-22 – 2019-09-26 (×13): 2 mg via INTRAVENOUS
  Filled 2019-09-22 (×13): qty 1

## 2019-09-22 MED ORDER — FUROSEMIDE 10 MG/ML IJ SOLN: 60.0000 mg | Freq: Once | INTRAMUSCULAR | Status: AC

## 2019-09-22 MED ORDER — ACETAMINOPHEN 500 MG PO TABS
1000.0000 mg | ORAL_TABLET | Freq: Three times a day (TID) | ORAL | Status: DC | PRN
  Administered 2019-09-22 – 2019-09-26 (×4): 1000 mg via ORAL
  Filled 2019-09-22 (×4): qty 2

## 2019-09-22 MED ORDER — BUPROPION HCL ER (XL) 150 MG PO TB24
150.0000 mg | ORAL_TABLET | Freq: Every day | ORAL | Status: DC
  Administered 2019-09-22 – 2019-09-26 (×5): 150 mg via ORAL
  Filled 2019-09-22 (×5): qty 1

## 2019-09-22 MED ORDER — SPIRONOLACTONE 25 MG PO TABS
25.0000 mg | ORAL_TABLET | Freq: Every day | ORAL | Status: DC
  Administered 2019-09-23 – 2019-09-25 (×3): 25 mg via ORAL
  Filled 2019-09-22 (×5): qty 1

## 2019-09-22 MED ORDER — MELATONIN 5 MG PO TABS
5.0000 mg | ORAL_TABLET | Freq: Every day | ORAL | Status: DC
  Administered 2019-09-22 – 2019-09-25 (×4): 5 mg via ORAL
  Filled 2019-09-22 (×4): qty 1

## 2019-09-22 MED ORDER — FLUDROCORTISONE ACETATE 0.1 MG PO TABS
0.1000 mg | ORAL_TABLET | Freq: Every day | ORAL | Status: DC
  Administered 2019-09-23 – 2019-09-26 (×4): 0.1 mg via ORAL
  Filled 2019-09-22 (×4): qty 1

## 2019-09-22 MED ORDER — ACETAMINOPHEN 325 MG PO TABS: 650.0000 mg | ORAL_TABLET | ORAL | Status: DC | PRN

## 2019-09-22 MED ORDER — DOCUSATE SODIUM 100 MG PO CAPS
100.0000 mg | ORAL_CAPSULE | Freq: Two times a day (BID) | ORAL | Status: DC
  Administered 2019-09-22 – 2019-09-26 (×8): 100 mg via ORAL
  Filled 2019-09-22 (×8): qty 1

## 2019-09-22 MED ORDER — NICOTINE 21 MG/24HR TD PT24
21.0000 mg | MEDICATED_PATCH | Freq: Every day | TRANSDERMAL | Status: DC
  Administered 2019-09-22 – 2019-09-26 (×5): 21 mg via TRANSDERMAL
  Filled 2019-09-22 (×5): qty 1

## 2019-09-22 MED ORDER — VALPROIC ACID 250 MG PO CAPS
500.0000 mg | ORAL_CAPSULE | Freq: Three times a day (TID) | ORAL | Status: DC
  Administered 2019-09-22 – 2019-09-26 (×12): 500 mg via ORAL
  Filled 2019-09-22 (×13): qty 2

## 2019-09-22 MED ORDER — CALCIUM CARBONATE-VITAMIN D 500-200 MG-UNIT PO TABS
1.0000 | ORAL_TABLET | Freq: Every day | ORAL | Status: DC
  Administered 2019-09-22 – 2019-09-26 (×5): 1 via ORAL
  Filled 2019-09-22 (×6): qty 1

## 2019-09-22 MED ORDER — INSULIN ASPART 100 UNIT/ML ~~LOC~~ SOLN
0.0000 [IU] | Freq: Three times a day (TID) | SUBCUTANEOUS | Status: DC
  Administered 2019-09-23: 3 [IU] via SUBCUTANEOUS
  Administered 2019-09-23: 2 [IU] via SUBCUTANEOUS
  Filled 2019-09-22 (×2): qty 1

## 2019-09-22 MED ORDER — ZINC SULFATE 220 (50 ZN) MG PO CAPS
220.0000 mg | ORAL_CAPSULE | Freq: Every day | ORAL | Status: DC
  Administered 2019-09-22 – 2019-09-26 (×5): 220 mg via ORAL
  Filled 2019-09-22 (×5): qty 1

## 2019-09-22 MED ORDER — HEPARIN SODIUM (PORCINE) 5000 UNIT/ML IJ SOLN
5000.0000 [IU] | Freq: Three times a day (TID) | INTRAMUSCULAR | Status: DC
  Administered 2019-09-22 – 2019-09-26 (×12): 5000 [IU] via SUBCUTANEOUS
  Filled 2019-09-22 (×12): qty 1

## 2019-09-22 MED ORDER — HYDROXYZINE HCL 25 MG PO TABS
25.0000 mg | ORAL_TABLET | Freq: Four times a day (QID) | ORAL | Status: DC | PRN
  Administered 2019-09-23 – 2019-09-26 (×4): 25 mg via ORAL
  Filled 2019-09-22 (×4): qty 1

## 2019-09-22 MED ORDER — PANTOPRAZOLE SODIUM 40 MG PO TBEC
40.0000 mg | DELAYED_RELEASE_TABLET | Freq: Every day | ORAL | Status: DC
  Administered 2019-09-22 – 2019-09-26 (×5): 40 mg via ORAL
  Filled 2019-09-22 (×5): qty 1

## 2019-09-22 MED ORDER — LOPERAMIDE HCL 2 MG PO CAPS: 2.0000 mg | ORAL_CAPSULE | Freq: Four times a day (QID) | ORAL | Status: DC | PRN

## 2019-09-22 MED ORDER — ADULT MULTIVITAMIN W/MINERALS CH
1.0000 | ORAL_TABLET | Freq: Every day | ORAL | Status: DC
  Administered 2019-09-22 – 2019-09-26 (×5): 1 via ORAL
  Filled 2019-09-22 (×5): qty 1

## 2019-09-22 MED ORDER — SODIUM CHLORIDE 0.9% FLUSH
3.0000 mL | Freq: Two times a day (BID) | INTRAVENOUS | Status: DC
  Administered 2019-09-23 – 2019-09-26 (×7): 3 mL via INTRAVENOUS

## 2019-09-22 MED ORDER — IBUPROFEN 600 MG PO TABS
600.0000 mg | ORAL_TABLET | Freq: Once | ORAL | Status: AC
  Administered 2019-09-22: 600 mg via ORAL
  Filled 2019-09-22: qty 1

## 2019-09-22 MED ORDER — SODIUM CHLORIDE 0.9% FLUSH: 3.0000 mL | INTRAVENOUS | Status: DC | PRN

## 2019-09-22 MED ORDER — FLUTICASONE PROPIONATE 50 MCG/ACT NA SUSP
2.0000 | Freq: Two times a day (BID) | NASAL | Status: DC
  Administered 2019-09-22 – 2019-09-25 (×7): 2 via NASAL
  Filled 2019-09-22: qty 16

## 2019-09-22 MED ORDER — SODIUM CHLORIDE 0.9 % IV SOLN: 250.0000 mL | INTRAVENOUS | Status: DC | PRN

## 2019-09-22 MED ORDER — LORATADINE 10 MG PO TABS
10.0000 mg | ORAL_TABLET | Freq: Every day | ORAL | Status: DC
  Administered 2019-09-22 – 2019-09-26 (×5): 10 mg via ORAL
  Filled 2019-09-22 (×5): qty 1

## 2019-09-22 MED ORDER — ONDANSETRON HCL 4 MG PO TABS
8.0000 mg | ORAL_TABLET | Freq: Four times a day (QID) | ORAL | Status: DC | PRN
  Administered 2019-09-24: 8 mg via ORAL
  Filled 2019-09-22: qty 2

## 2019-09-22 MED ORDER — SUCRALFATE 1 GM/10ML PO SUSP
1.0000 g | Freq: Three times a day (TID) | ORAL | Status: DC
  Administered 2019-09-22 – 2019-09-26 (×15): 1 g via ORAL
  Filled 2019-09-22 (×19): qty 10

## 2019-09-22 MED ORDER — ALBUTEROL SULFATE HFA 108 (90 BASE) MCG/ACT IN AERS
2.0000 | INHALATION_SPRAY | Freq: Four times a day (QID) | RESPIRATORY_TRACT | Status: DC | PRN
  Administered 2019-09-24: 13:00:00 2 via RESPIRATORY_TRACT
  Filled 2019-09-22 (×2): qty 6.7

## 2019-09-22 MED ORDER — NIFEDIPINE ER OSMOTIC RELEASE 30 MG PO TB24
90.0000 mg | ORAL_TABLET | Freq: Every day | ORAL | Status: DC
  Administered 2019-09-23 – 2019-09-26 (×4): 90 mg via ORAL
  Filled 2019-09-22 (×2): qty 3
  Filled 2019-09-22: qty 1
  Filled 2019-09-22 (×2): qty 3
  Filled 2019-09-22: qty 1

## 2019-09-22 MED ORDER — ASCORBIC ACID 500 MG PO TABS
1000.0000 mg | ORAL_TABLET | Freq: Every day | ORAL | Status: DC
  Administered 2019-09-22 – 2019-09-26 (×5): 1000 mg via ORAL
  Filled 2019-09-22 (×5): qty 2

## 2019-09-22 MED ORDER — CLONAZEPAM 1 MG PO TABS
1.0000 mg | ORAL_TABLET | Freq: Two times a day (BID) | ORAL | Status: DC
  Administered 2019-09-22 – 2019-09-26 (×8): 1 mg via ORAL
  Filled 2019-09-22 (×5): qty 1
  Filled 2019-09-22: qty 2
  Filled 2019-09-22 (×2): qty 1

## 2019-09-22 MED ORDER — ENSURE ENLIVE PO LIQD
237.0000 mL | ORAL | Status: DC
  Administered 2019-09-22 – 2019-09-25 (×4): 237 mL via ORAL

## 2019-09-22 NOTE — Plan of Care (Signed)
  Problem: Education: Goal: Knowledge of General Education information will improve Description Including pain rating scale, medication(s)/side effects and non-pharmacologic comfort measures Outcome: Progressing   

## 2019-09-22 NOTE — ED Notes (Signed)
Admitting MD at bedside at this time. MD made aware of pt's hypertension in R arm (200s/80s) and normal BP measurements in L arm.

## 2019-09-22 NOTE — ED Triage Notes (Signed)
Pt arrives to ED via [place] from [facility] with SOB. Chronic 4 L [place], was 90% on that. Pt c/o that she was having difficulty breathing so EMS placed on non rebreather but denies change. Recent COVID with pneumonia. Arrives on non-rebreather. Labored breathing noted.

## 2019-09-22 NOTE — ED Notes (Signed)
Taken pt off nonrebreather and placed on [place] at 6 L at this time.

## 2019-09-22 NOTE — H&P (Signed)
History and Physical   Natalie Thomas [id number] DOB: February 01, 1961 DOA: 09/22/2019  Referring MD/NP/PA: Dr. Archie Balboa  PCP: System, Pcp Not In   Outpatient Specialists: None  Patient coming from: Home  Chief Complaint: Shortness of  HPI: Natalie Thomas is a 59 y.o. female with medical history significant of recent diagnosis of systolic dysfunction CHF, chronic hyponatremia, anxiety disorder, schizoaffective disorder, anemia, hyperlipidemia, hypertension and diabetes who was discharged from the hospital on March 24 to skilled nursing facility after admission for 3 days with acute respiratory failure secondary to hypoxemia from CHF and COPD.  During that admission she had echocardiogram showing EF of 40 to 45%.  Patient discharged on oxygen at 2 L/min.  She was seen by cardiology and home dose Lasix was increased from 20 mg to 40 mg.  Aldactone added at 25 mg.  Patient still to follow-up with cardiology.  She was brought back today with worsening shortness of breath cough and hypoxemia.  She is hypoxic even on the 4 L.  Work-up in the ER showed bilateral pleural effusion as well as pulmonary edema.  Patient is being admitted to the hospital at this point with acute respiratory failure with hypoxia secondary to bilateral pleural effusion.  Patient was said to have had previous Covid pneumonia but most recent testings have been negative..  ED Course: Temperature nine 6.9 blood pressure 126/79 initially on the right side and 109/82 on the left side, pulse 79 respirate 28 oxygen sat 92% on 4 L.  CBC appears to be within normal.  Creatinine 0.70.  Troponin XIX lactic acid 1.1.  Urinalysis essentially negative.  Repeat COVID-19 also negative.  Chest x-ray shows findings consistent with CHF.  She has been admitted to the hospital for treatment.  Review of Systems: As per HPI otherwise 10 point review of systems negative.    Past Medical History:  Diagnosis Date  . Anemia 10/16/2017  . Anxiety and  depression   . CHF (congestive heart failure) ([place])   . Chronic hyponatremia 04/08/2007   Qualifier: Diagnosis of  By: Marca Ancona RMA, Lucy    . Closed comminuted intertrochanteric fracture of right femur ([place]) 11/07/2017  . COPD 04/08/2007   Qualifier: Diagnosis of  By: Marca Ancona RMA, Lucy    . Depression   . Diabetes mellitus, type 2 ([place])    pt denies but states that she has been treated for DM  . Essential hypertension 04/08/2007   Qualifier: Diagnosis of  By: Marca Ancona RMA, Lucy    . GERD (gastroesophageal reflux disease)   . Hyperlipidemia 10/07/2017  . Lumbar disc disease 04/09/2011  . MENOPAUSE, PREMATURE 04/08/2007   Qualifier: Diagnosis of  By: Marca Ancona RMA, Lucy    . 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 Osteoporosis 04/08/2007   Qualifier: Diagnosis of  By: Reatha Armour, Lucy    . Polycythemia secondary to smoking 04/09/2011  . 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 Schizoaffective disorder, bipolar type ([place]) 07/05/2011    Past Surgical History:  Procedure Laterality Date  . BIOPSY  07/11/2019   Procedure: BIOPSY;  Surgeon: Jerene Bears, MD;  Location: [facility] ENDOSCOPY;  Service: Gastroenterology;;  . ESOPHAGOGASTRODUODENOSCOPY (EGD) WITH PROPOFOL N/A 07/11/2019   Procedure: ESOPHAGOGASTRODUODENOSCOPY (EGD) WITH PROPOFOL;  Surgeon: Jerene Bears, MD;  Location: [facility];  Service: Gastroenterology;  Laterality: N/A;  . EYE SURGERY     on left  eye, pt states that she sees bad out of  the right eye and needs surgery there  . INTRAMEDULLARY (IM) NAIL INTERTROCHANTERIC Right 11/13/2017   Procedure: INTRAMEDULLARY (IM) NAIL INTERTROCHANTRIC;  Surgeon: Shona Needles, MD;  Location: [facility];  Service: Orthopedics;  Laterality: Right;  . PELVIC FRACTURE SURGERY       reports that she quit smoking about 2 months ago. Her smoking use included cigarettes. She smoked 0.25 packs per day. She has never used smokeless tobacco. She reports that she does  not drink alcohol or use drugs.  Allergies  Allergen Reactions  . Clarithromycin Itching  . Penicillins Itching    Has tolerated cefazolin before  Has patient had a PCN reaction causing immediate rash, facial/tongue/throat swelling, SOB or lightheadedness with hypotension: No Has patient had a PCN reaction causing severe rash involving mucus membranes or skin necrosis: No Has patient had a PCN reaction that required hospitalization: Unknown Has patient had a PCN reaction occurring within the last 10 years: No If all of the above answers are "NO", then may proceed with Cephalosporin use.     History reviewed. No pertinent family history.   Prior to Admission medications   Medication Sig Start Date End Date Taking? Authorizing Provider  acetaminophen (TYLENOL) 500 MG tablet Take 1,000 mg by mouth every 8 (eight) hours as needed for mild pain or moderate pain. Not to exceed 3g/24h of tylenol from all sources.   Yes [provider]  albuterol (PROVENTIL) (2.5 MG/3ML) 0.083% nebulizer solution Take 3 mLs (2.5 mg total) by nebulization every 4 (four) hours as needed for wheezing or shortness of breath. 12/11/18  Yes Swayze, Ava, DO  albuterol (VENTOLIN HFA) 108 (90 Base) MCG/ACT inhaler Inhale 2 puffs into the lungs every 6 (six) hours as needed for wheezing or shortness of breath.   Yes [provider]  Alum & Mag Hydroxide-Simeth (ANTACID LIQUID PO) Take 30 mLs by mouth every 6 (six) hours as needed (heartburn).   Yes [provider]  ascorbic acid (VITAMIN C) 1000 MG tablet Take 1 tablet (1,000 mg total) by mouth daily. 09/17/19  Yes Lorella Nimrod, MD  buPROPion (WELLBUTRIN XL) 150 MG 24 hr tablet Take 150 mg by mouth daily.   Yes [provider]  Calcium Carb-Cholecalciferol (CALCIUM 600+D) 600-800 MG-UNIT TABS Take 1 tablet by mouth daily.   Yes [provider]  clonazePAM (KLONOPIN) 1 MG tablet Take 1 tablet (1 mg total) by mouth 2 (two) times  daily. 07/16/19  Yes Mercy Riding, MD  docusate sodium (COLACE) 100 MG capsule Take 100 mg by mouth 2 (two) times daily.    Yes [provider]  feeding supplement, ENSURE ENLIVE, (ENSURE ENLIVE) LIQD Take 237 mLs by mouth daily. 03/14/19  Yes Oretha Milch D, MD  fludrocortisone (FLORINEF) 0.1 MG tablet Take 1 tablet (0.1 mg total) by mouth daily. 07/17/19  Yes Mercy Riding, MD  fluticasone (FLONASE) 50 MCG/ACT nasal spray Place 2 sprays into both nostrils 2 (two) times daily.   Yes [provider]  fluticasone furoate-vilanterol (BREO ELLIPTA) 100-25 MCG/INH AEPB Inhale 1 puff into the lungs daily.   Yes [provider]  furosemide (LASIX) 40 MG tablet Take 1 tablet (40 mg total) by mouth daily. 09/16/19  Yes Lorella Nimrod, MD  guaiFENesin (MUCINEX) 600 MG 12 hr tablet Take 1,200 mg by mouth daily. 09/17/19 09/27/19 Yes [provider]  hydrOXYzine (ATARAX/VISTARIL) 25 MG tablet Take 25 mg by mouth every 6 (six) hours as needed for anxiety or itching.  Yes [provider]  loperamide (IMODIUM) 2 MG capsule Take 2 mg by mouth every 6 (six) hours as needed for diarrhea or loose stools.  06/28/19  Yes [provider]  loratadine (CLARITIN) 10 MG tablet Take 10 mg by mouth daily.   Yes [provider]  lurasidone (LATUDA) 20 MG TABS tablet Take 1 tablet (20 mg total) by mouth 2 (two) times daily. 05/15/19  Yes Hongalgi, Lenis Dickinson, MD  Melatonin 5 MG TABS Take 5 mg by mouth at bedtime.   Yes [provider]  Multiple Vitamin (MULTIVITAMIN WITH MINERALS) TABS tablet Take 1 tablet by mouth daily. 03/15/19  Yes Oretha Milch D, MD  nicotine (NICODERM CQ - DOSED IN MG/24 HOURS) 21 mg/24hr patch Place 21 mg onto the skin daily.    Yes [provider]  NIFEdipine (PROCARDIA XL/ADALAT-CC) 90 MG 24 hr tablet Take 90 mg by mouth daily.   Yes [provider]  nystatin (NYSTATIN) powder Apply 1 application topically 2 (two) times  daily as needed (irritation). Apply topically to reddened area twice daily as needed for irritation   Yes [provider]  OLANZapine (ZYPREXA) 10 MG tablet Take 10 mg by mouth 2 (two) times daily.   Yes [provider]  ondansetron (ZOFRAN) 8 MG tablet Take 8 mg by mouth every 6 (six) hours as needed for nausea or vomiting.   Yes [provider]  pantoprazole (PROTONIX) 40 MG tablet Take 1 tablet (40 mg total) by mouth daily. 07/16/19 01/12/20 Yes Mercy Riding, MD  polyethylene glycol (MIRALAX) 17 g packet Take 17 g by mouth daily. Patient taking differently: Take 17 g by mouth daily. Mix 1 capful (17 g) in 4-8 oz of water or juice and give by mouth once daily for bowel regimen 12/11/18  Yes Swayze, Ava, DO  sodium chloride (OCEAN) 0.65 % SOLN nasal spray Place 2 sprays into both nostrils every 2 (two) hours as needed (dryness, irritation).   Yes [provider]  spironolactone (ALDACTONE) 25 MG tablet Take 1 tablet (25 mg total) by mouth daily. 09/17/19  Yes Lorella Nimrod, MD  sucralfate (CARAFATE) 1 GM/10ML suspension Take 10 mLs (1 g total) by mouth 4 (four) times daily -  with meals and at bedtime. 07/16/19  Yes Mercy Riding, MD  valproic acid (DEPAKENE) 250 MG capsule Take 2 capsules (500 mg total) by mouth 3 (three) times daily. 09/08/19  Yes Fritzi Mandes, MD  zinc sulfate 220 (50 Zn) MG capsule Take 1 capsule (220 mg total) by mouth daily. 09/17/19  Yes Lorella Nimrod, MD  neomycin-bacitracin-polymyxin (NEOSPORIN) ointment Apply 1 application topically as needed for wound care.    [provider]    Physical Exam: Vitals:   09/22/19 1700 09/22/19 1730 09/22/19 1800 09/22/19 1830  BP: (!) 215/71 (!) 195/72 (!) 216/79 (!) 202/112  Pulse: 67  68 72  Resp: 16 (!) 24 (!) 23 16  Temp:      TempSrc:      SpO2: 96%  99% 95%  Weight:      Height:          Constitutional: Anxious and acute respiratory distress Vitals:   09/22/19 1700 09/22/19 1730  09/22/19 1800 09/22/19 1830  BP: (!) 215/71 (!) 195/72 (!) 216/79 (!) 202/112  Pulse: 67  68 72  Resp: 16 (!) 24 (!) 23 16  Temp:      TempSrc:      SpO2: 96%  99% 95%  Weight:      Height:       Eyes: PERRL, lids and conjunctivae normal ENMT: Mucous membranes are moist. Posterior pharynx clear of any exudate or lesions.Normal dentition.  Neck: normal, supple, no masses, no thyromegaly Respiratory: Tachypneic, decreased air entry bilaterally, marked crackles, no significant wheezing, use of accessory muscles of respiration Cardiovascular: Sinus tachycardia, no murmurs / rubs / gallops.  2+ extremity edema. 2+ pedal pulses. No carotid bruits.  Abdomen: no tenderness, no masses palpated. No hepatosplenomegaly. Bowel sounds positive.  Musculoskeletal: no clubbing / cyanosis. No joint deformity upper and lower extremities. Good ROM, no contractures. Normal muscle tone.  Skin: no rashes, lesions, ulcers. No induration Neurologic: CN 2-12 grossly intact. Sensation intact, DTR normal. Strength 5/5 in all 4.  Psychiatric: Normal judgment and insight. Alert and oriented x 3.  Anxious mood.     Labs on Admission: I have personally reviewed following labs and imaging studies  CBC: Recent Labs  Lab 09/22/19 1535  WBC 11.8*  HGB 12.6  HCT 39.3  MCV 92.0  PLT 123456   Basic Metabolic Panel: Recent Labs  Lab 09/16/19 0630 09/22/19 1535  NA 131* 129*  K 4.4 4.9  CL 84* 85*  CO2 38* 34*  GLUCOSE 146* 138*  BUN 26* 18  CREATININE 0.55 0.71  CALCIUM 8.2* 8.9   GFR: Estimated Creatinine Clearance: 94.2 mL/min (by C-G formula based on SCr of 0.71 mg/dL). Liver Function Tests: No results for input(s): AST, ALT, ALKPHOS, BILITOT, PROT, ALBUMIN in the last 168 hours. No results for input(s): LIPASE, AMYLASE in the last 168 hours. No results for input(s): AMMONIA in the last 168 hours. Coagulation Profile: No results for input(s): INR, PROTIME in the last 168 hours. Cardiac Enzymes: No  results for input(s): CKTOTAL, CKMB, CKMBINDEX, TROPONINI in the last 168 hours. BNP (last 3 results) No results for input(s): PROBNP in the last 8760 hours. HbA1C: No results for input(s): HGBA1C in the last 72 hours. CBG: Recent Labs  Lab 09/15/19 1956 09/16/19 0734 09/16/19 1151 09/16/19 1639  GLUCAP 207* 105* 106* 185*   Lipid Profile: No results for input(s): CHOL, HDL, LDLCALC, TRIG, CHOLHDL, LDLDIRECT in the last 72 hours. Thyroid Function Tests: No results for input(s): TSH, T4TOTAL, FREET4, T3FREE, THYROIDAB in the last 72 hours. Anemia Panel: No results for input(s): VITAMINB12, FOLATE, FERRITIN, TIBC, IRON, RETICCTPCT in the last 72 hours. Urine analysis:    Component Value Date/Time   COLORURINE YELLOW 07/02/2019 1048   APPEARANCEUR CLEAR 07/02/2019 1048   APPEARANCEUR Clear 06/22/2012 0618   LABSPEC 1.012 07/02/2019 1048   LABSPEC 1.003 06/22/2012 0618   PHURINE 6.0 07/02/2019 1048   GLUCOSEU NEGATIVE 07/02/2019 1048   GLUCOSEU Negative 06/22/2012 0618   GLUCOSEU NEGATIVE 04/09/2011 1058   HGBUR NEGATIVE 07/02/2019 1048   BILIRUBINUR NEGATIVE 07/02/2019 1048   BILIRUBINUR Negative 06/22/2012 0618   KETONESUR NEGATIVE 07/02/2019 1048   PROTEINUR NEGATIVE 07/02/2019 1048   UROBILINOGEN 1.0 08/18/2011 1805   NITRITE NEGATIVE 07/02/2019 1048   LEUKOCYTESUR NEGATIVE 07/02/2019 1048   LEUKOCYTESUR Trace 06/22/2012 0618   Sepsis Labs: @LABRCNTIP (procalcitonin:4,lacticidven:4) ) Recent Results (from the past 240 hour(s))  Blood culture (routine x 2)     Status: None   Collection Time: 09/13/19  1:45 PM   Specimen: BLOOD  Result Value Ref Range Status   Specimen Description BLOOD BLOOD RIGHT FOREARM  Final   Special Requests   Final    BOTTLES DRAWN AEROBIC AND ANAEROBIC Blood Culture adequate volume  Culture   Final    NO GROWTH 5 DAYS Performed at [facility], [address] [place], [place] [address]    Report Status 09/18/2019 FINAL  Final   Blood culture (routine x 2)     Status: None   Collection Time: 09/13/19  1:53 PM   Specimen: BLOOD  Result Value Ref Range Status   Specimen Description BLOOD BLOOD LEFT HAND  Final   Special Requests   Final    BOTTLES DRAWN AEROBIC AND ANAEROBIC Blood Culture results may not be optimal due to an inadequate volume of blood received in culture bottles   Culture   Final    NO GROWTH 5 DAYS Performed at [facility], [address] [place], [place] [address]    Report Status 09/18/2019 FINAL  Final  Respiratory Panel by RT PCR (Flu A&B, Covid) - Nasopharyngeal Swab     Status: None   Collection Time: 09/13/19  4:35 PM   Specimen: Nasopharyngeal Swab  Result Value Ref Range Status   SARS Coronavirus 2 by RT PCR NEGATIVE NEGATIVE Final    Comment: (NOTE) SARS-CoV-2 target nucleic acids are NOT DETECTED. The SARS-CoV-2 RNA is generally detectable in upper respiratoy specimens during the acute phase of infection. The lowest concentration of SARS-CoV-2 viral copies this assay can detect is 131 copies/mL. A negative result does not preclude SARS-Cov-2 infection and should not be used as the sole basis for treatment or other patient management decisions. A negative result may occur with  improper specimen collection/handling, submission of specimen other than nasopharyngeal swab, presence of viral mutation(s) within the areas targeted by this assay, and inadequate number of viral copies (<131 copies/mL). A negative result must be combined with clinical observations, patient history, and epidemiological information. The expected result is Negative. Fact Sheet for Patients:  PinkCheek.be Fact Sheet for Healthcare Providers:  GravelBags.it This test is not yet ap proved or cleared by the [place] FDA and  has been authorized for detection and/or diagnosis of SARS-CoV-2 by FDA under an Emergency Use Authorization  (EUA). This EUA will remain  in effect (meaning this test can be used) for the duration of the COVID-19 declaration under Section 564(b)(1) of the Act, 21 U.S.C. section 360bbb-3(b)(1), unless the authorization is terminated or revoked sooner.    Influenza A by PCR NEGATIVE NEGATIVE Final   Influenza B by PCR NEGATIVE NEGATIVE Final    Comment: (NOTE) The Xpert Xpress SARS-CoV-2/FLU/RSV assay is intended as an aid in  the diagnosis of influenza from Nasopharyngeal swab specimens and  should not be used as a sole basis for treatment. Nasal washings and  aspirates are unacceptable for Xpert Xpress SARS-CoV-2/FLU/RSV  testing. Fact Sheet for Patients: PinkCheek.be Fact Sheet for Healthcare Providers: GravelBags.it This test is not yet approved or cleared by the [place] FDA and  has been authorized for detection and/or diagnosis of SARS-CoV-2 by  FDA under an Emergency Use Authorization (EUA). This EUA will remain  in effect (meaning this test can be used) for the duration of the  Covid-19 declaration under Section 564(b)(1) of the Act, 21  U.S.C. section 360bbb-3(b)(1), unless the authorization is  terminated or revoked. Performed at [facility], [address] [place], [place] [address]      Radiological Exams on Admission: DG Chest Portable 1 View  Result Date: 09/22/2019 CLINICAL DATA:  Difficulty breathing, recent COVID pneumonia EXAM: PORTABLE CHEST 1 VIEW COMPARISON:  09/13/2019 FINDINGS: Single frontal view of the chest  demonstrates persistent enlargement the cardiac silhouette. Chronic bilateral vascular congestion with scattered ground-glass airspace disease and bilateral effusions. No pneumothorax. IMPRESSION: 1. Findings consistent with congestive heart failure. Electronically Signed   By: Randa Ngo M.D.   On: 09/22/2019 16:01    EKG: Independently reviewed.  Sinus rhythm with no significant ST  changes.  Assessment/Plan Principal Problem:   Acute on chronic respiratory failure with hypoxia and hypercapnia (HCC) Active Problems:   DM (diabetes mellitus), type 2 (HCC)   Schizoaffective disorder (HCC)   Essential hypertension   COPD (chronic obstructive pulmonary disease) (HCC)   GERD   Hyperlipidemia   Pleural effusion   Acute on chronic respiratory failure with hypoxemia (HCC)     #1 acute on chronic respiratory failure with hypoxia and hypercarbia: Patient appears to have acute exacerbation of systolic CHF.  She has worsening pleural effusion which is moderate in size and bilateral.  Also has pedal edema with elevated BNP and chest x-ray confirmed CHF.  #2 diabetes: Sliding scale insulin and home regimen  #3 essential hypertension: Continue blood pressure control  #4 schizoaffective disorder: Continue close monitoring  #5 COPD: No exacerbation  #6 bilateral pleural effusion: Most likely due to CHF.  Diuresis and consider thoracentesis if no improvement  #7 GERD: Continue PPI  #8 hyperlipidemia: Continue with statin    DVT prophylaxis: Heparin Code Status: Full code Family Communication: No family at bedside Disposition Plan: Back to skilled facility Consults called: None but needs to consult cardiology in the morning Admission status: Inpatient  Severity of Illness: The appropriate patient status for this patient is INPATIENT. Inpatient status is judged to be reasonable and necessary in order to provide the required intensity of service to ensure the patient's safety. The patient's presenting symptoms, physical exam findings, and initial radiographic and laboratory data in the context of their chronic comorbidities is felt to place them at high risk for further clinical deterioration. Furthermore, it is not anticipated that the patient will be medically stable for discharge from the hospital within 2 midnights of admission. The following factors support the  patient status of inpatient.   " The patient's presenting symptoms include shortness of breath. " The worrisome physical exam findings include decreased air entry with tachypnea. " The initial radiographic and laboratory data are worrisome because of bilateral pleural effusion. " The chronic co-morbidities include systolic dysfunction CHF.   * I certify that at the point of admission it is my clinical judgment that the patient will require inpatient hospital care spanning beyond 2 midnights from the point of admission due to high intensity of service, high risk for further deterioration and high frequency of surveillance required.Barbette Merino MD [facility] Pager [phone]  If 7PM-7AM, please contact night-coverage [email or web address] Password [facility]  09/22/2019, 6:56 PM

## 2019-09-22 NOTE — ED Provider Notes (Signed)
[facility] Emergency Department Provider Note   ____________________________________________   I have reviewed the triage vital signs and the nursing notes.   HISTORY  Chief Complaint Shortness of Breath   History limited by: Not Limited   HPI Natalie Thomas is a 59 y.o. female who presents to the emergency department today because of concerns for shortness of breath.  Patient states this episode started 3 days ago.  Patient has had 2 hospitalizations earlier this month for shortness of breath.  She states since discharge from the hospital roughly 5 days ago she had been feeling somewhat better until the shortness of breath started again.  She also is complaining of discomfort in her back.  She states she normally takes OxyContin for this.  Patient has noticed some swelling in her legs. She denies any fevers.    Records reviewed. Per medical record review patient has a history of two hospitalizations earlier this month for shortness of breath.   Past Medical History:  Diagnosis Date  . Anemia 10/16/2017  . Anxiety and depression   . CHF (congestive heart failure) ([place])   . Chronic hyponatremia 04/08/2007   Qualifier: Diagnosis of  By: Marca Ancona RMA, Lucy    . Closed comminuted intertrochanteric fracture of right femur ([place]) 11/07/2017  . COPD 04/08/2007   Qualifier: Diagnosis of  By: Marca Ancona RMA, Lucy    . Depression   . Diabetes mellitus, type 2 ([place])    pt denies but states that she has been treated for DM  . Essential hypertension 04/08/2007   Qualifier: Diagnosis of  By: Marca Ancona RMA, Lucy    . GERD (gastroesophageal reflux disease)   . Hyperlipidemia 10/07/2017  . Lumbar disc disease 04/09/2011  . MENOPAUSE, PREMATURE 04/08/2007   Qualifier: Diagnosis of  By: Marca Ancona RMA, Lucy    . 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 Osteoporosis 04/08/2007   Qualifier: Diagnosis of  By: Reatha Armour, Lucy    .  Polycythemia secondary to smoking 04/09/2011  . 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 Schizoaffective disorder, bipolar type ([place]) 07/05/2011    Patient Active Problem List   Diagnosis Date Noted  . Acute on chronic respiratory failure with hypercapnia ([place]) 09/13/2019  . Leukocytosis 09/13/2019  . Obesity, Class III, BMI 40-49.9 (morbid obesity) ([place])   . Acute on chronic respiratory failure with hypoxemia ([place]) 09/04/2019  . COVID-19 virus infection   . 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 Nausea   . Acute gastritis   . Abdominal pain, chronic, epigastric   . Hyponatremia 07/09/2019  . Ventral hernia 07/09/2019  . Hypertensive crisis 05/01/2019  . Acute abdominal pain 03/15/2019  . Pleural effusion 03/13/2019  . Atelectasis 03/13/2019  . Chronic diastolic CHF (congestive heart failure) ([place]) 03/13/2019  . Type 2 diabetes mellitus without complication ([place]) [id number]  . Urinary tract bacterial infections 03/09/2019  . Acute cystitis without hematuria   . Constipation   . Acute hypoxemic respiratory failure ([place]) 12/03/2018  . Irritability 11/09/2018  . Aggressive behavior 11/09/2018  . Agitation   . S/P right hip fracture 11/07/2017  . Closed comminuted intertrochanteric fracture of right femur ([place]) 11/07/2017  . Anxiety and depression   . Dyspnea 10/16/2017  . Anemia 10/16/2017  . COPD exacerbation ([place]) 10/07/2017  . Hyperlipidemia 10/07/2017  . Acute on chronic respiratory failure with hypoxia and hypercapnia ([place]) 10/07/2017  . Incontinence of urine 08/14/2011  . Schizoaffective disorder, bipolar type ([place])  07/05/2011  . Cough 05/08/2011  . Lumbar disc disease 04/09/2011  . Polycythemia secondary to smoking 04/09/2011  . HIP PAIN, LEFT 12/12/2007  . 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 DM (diabetes mellitus), type 2 ([place]) 04/08/2007  . MENOPAUSE, PREMATURE 04/08/2007  . Chronic hyponatremia 04/08/2007  . Schizoaffective disorder ([place]) 04/08/2007  . Essential  hypertension 04/08/2007  . COPD (chronic obstructive pulmonary disease) ([place]) 04/08/2007  . GERD 04/08/2007  . Osteoporosis 04/08/2007    Past Surgical History:  Procedure Laterality Date  . BIOPSY  07/11/2019   Procedure: BIOPSY;  Surgeon: Jerene Bears, MD;  Location: [facility] ENDOSCOPY;  Service: Gastroenterology;;  . ESOPHAGOGASTRODUODENOSCOPY (EGD) WITH PROPOFOL N/A 07/11/2019   Procedure: ESOPHAGOGASTRODUODENOSCOPY (EGD) WITH PROPOFOL;  Surgeon: Jerene Bears, MD;  Location: [place];  Service: Gastroenterology;  Laterality: N/A;  . EYE SURGERY     on left  eye, pt states that she sees bad out of the right eye and needs surgery there  . INTRAMEDULLARY (IM) NAIL INTERTROCHANTERIC Right 11/13/2017   Procedure: INTRAMEDULLARY (IM) NAIL INTERTROCHANTRIC;  Surgeon: Shona Needles, MD;  Location: [place];  Service: Orthopedics;  Laterality: Right;  . PELVIC FRACTURE SURGERY      Prior to Admission medications   Medication Sig Start Date End Date Taking? Authorizing Provider  acetaminophen (TYLENOL) 500 MG tablet Take 1,000 mg by mouth every 8 (eight) hours as needed for mild pain or moderate pain. Not to exceed 3g/24h of tylenol from all sources.    [provider]  albuterol (PROVENTIL) (2.5 MG/3ML) 0.083% nebulizer solution Take 3 mLs (2.5 mg total) by nebulization every 4 (four) hours as needed for wheezing or shortness of breath. 12/11/18   Swayze, Ava, DO  albuterol (VENTOLIN HFA) 108 (90 Base) MCG/ACT inhaler Inhale 2 puffs into the lungs every 6 (six) hours as needed for wheezing or shortness of breath.    [provider]  Alum & Mag Hydroxide-Simeth (ANTACID LIQUID PO) Take 30 mLs by mouth every 6 (six) hours as needed (heartburn).    [provider]  ascorbic acid (VITAMIN C) 1000 MG tablet Take 1 tablet (1,000 mg total) by mouth daily. 09/17/19   Lorella Nimrod, MD  buPROPion (WELLBUTRIN XL) 150 MG 24 hr tablet Take 150 mg by mouth daily.    [provider]  Calcium Carb-Cholecalciferol (CALCIUM 600+D) 600-800 MG-UNIT TABS Take 1 tablet by mouth daily.    [provider]  clonazePAM (KLONOPIN) 1 MG tablet Take 1 tablet (1 mg total) by mouth 2 (two) times daily. 07/16/19   Mercy Riding, MD  docusate sodium (COLACE) 100 MG capsule Take 100 mg by mouth 2 (two) times daily.     [provider]  feeding supplement, ENSURE ENLIVE, (ENSURE ENLIVE) LIQD Take 237 mLs by mouth daily. 03/14/19   Desiree Hane, MD  fludrocortisone (FLORINEF) 0.1 MG tablet Take 1 tablet (0.1 mg total) by mouth daily. 07/17/19   Mercy Riding, MD  fluticasone (FLONASE) 50 MCG/ACT nasal spray Place 2 sprays into both nostrils 2 (two) times daily.    [provider]  fluticasone furoate-vilanterol (BREO ELLIPTA) 100-25 MCG/INH AEPB Inhale 1 puff into the lungs daily.    [provider]  furosemide (LASIX) 40 MG tablet Take 1 tablet (40 mg total) by mouth daily. 09/16/19   Lorella Nimrod, MD  guaiFENesin (MUCINEX) 600 MG 12 hr tablet Take 1,200 mg by mouth daily.    [provider]  hydrOXYzine (ATARAX/VISTARIL) 25  MG tablet Take 25 mg by mouth every 6 (six) hours as needed for anxiety or itching.    [provider]  loperamide (IMODIUM) 2 MG capsule Take 2 mg by mouth every 6 (six) hours as needed for diarrhea or loose stools.  06/28/19   [provider]  loratadine (CLARITIN) 10 MG tablet Take 10 mg by mouth daily.    [provider]  lurasidone (LATUDA) 20 MG TABS tablet Take 1 tablet (20 mg total) by mouth 2 (two) times daily. 05/15/19   Hongalgi, Lenis Dickinson, MD  Melatonin 5 MG TABS Take 5 mg by mouth at bedtime.    [provider]  Multiple Vitamin (MULTIVITAMIN WITH MINERALS) TABS tablet Take 1 tablet by mouth daily. 03/15/19   Desiree Hane, MD  neomycin-bacitracin-polymyxin (NEOSPORIN) ointment Apply 1 application topically as needed for wound care.    [provider]  nicotine (NICODERM CQ  - DOSED IN MG/24 HOURS) 21 mg/24hr patch Place 21 mg onto the skin daily.     [provider]  NIFEdipine (PROCARDIA XL/ADALAT-CC) 90 MG 24 hr tablet Take 90 mg by mouth daily.    [provider]  nystatin (NYSTATIN) powder Apply 1 application topically 2 (two) times daily as needed (irritation). Apply topically to reddened area twice daily as needed for irritation    [provider]  OLANZapine (ZYPREXA) 10 MG tablet Take 10 mg by mouth 2 (two) times daily.    [provider]  ondansetron (ZOFRAN) 8 MG tablet Take 8 mg by mouth every 6 (six) hours as needed for nausea or vomiting.    [provider]  pantoprazole (PROTONIX) 40 MG tablet Take 1 tablet (40 mg total) by mouth daily. 07/16/19 01/12/20  Mercy Riding, MD  polyethylene glycol (MIRALAX) 17 g packet Take 17 g by mouth daily. Patient taking differently: Take 17 g by mouth daily. Mix 1 capful (17 g) in 4-8 oz of water or juice and give by mouth once daily for bowel regimen 12/11/18   Swayze, Ava, DO  sodium chloride (OCEAN) 0.65 % SOLN nasal spray Place 2 sprays into both nostrils every 2 (two) hours as needed (dryness, irritation).    [provider]  spironolactone (ALDACTONE) 25 MG tablet Take 1 tablet (25 mg total) by mouth daily. 09/17/19   Lorella Nimrod, MD  sucralfate (CARAFATE) 1 GM/10ML suspension Take 10 mLs (1 g total) by mouth 4 (four) times daily -  with meals and at bedtime. 07/16/19   Mercy Riding, MD  valproic acid (DEPAKENE) 250 MG capsule Take 2 capsules (500 mg total) by mouth 3 (three) times daily. 09/08/19   Fritzi Mandes, MD  zinc sulfate 220 (50 Zn) MG capsule Take 1 capsule (220 mg total) by mouth daily. 09/17/19   Lorella Nimrod, MD    Allergies Clarithromycin and Penicillins  History reviewed. No pertinent family history.  Social History Social History   Tobacco Use  . Smoking status: Former Smoker    Packs/day: 0.25    Types: Cigarettes    Quit date: 07/23/2019     Years since quitting: 0.1  . Smokeless tobacco: Never Used  Substance Use Topics  . Alcohol use: No  . Drug use: No    Review of Systems Constitutional: No fever/chills Eyes: No visual changes. ENT: No sore throat. Cardiovascular: Denies chest pain. Respiratory: Positive for shortness of breath. Gastrointestinal: No abdominal pain.  No nausea, no vomiting.  No diarrhea.   Genitourinary: Negative for  dysuria. Musculoskeletal: Positive for back pain.  Skin: Negative for rash. Neurological: Negative for headaches, focal weakness or numbness.  ____________________________________________   PHYSICAL EXAM:  VITAL SIGNS: ED Triage Vitals  Enc Vitals Group     BP 09/22/19 1531 (!) 189/86     Pulse Rate 09/22/19 1525 78     Resp 09/22/19 1525 (!) 28     Temp 09/22/19 1525 (!) 96.9 F (36.1 C)     Temp Source 09/22/19 1525 Axillary     SpO2 09/22/19 1525 94 %     Weight 09/22/19 1531 235 lb 4.8 oz (106.7 kg)     Height 09/22/19 1531 5\' 5"  (1.651 m)     Head Circumference --      Peak Flow --      Pain Score 09/22/19 1531 9   Constitutional: Alert and oriented.  Eyes: Conjunctivae are normal.  ENT      Head: Normocephalic and atraumatic.      Nose: No congestion/rhinnorhea.      Mouth/Throat: Mucous membranes are moist.      Neck: No stridor. Hematological/Lymphatic/Immunilogical: No cervical lymphadenopathy. Cardiovascular: Normal rate, regular rhythm.  No murmurs, rubs, or gallops. Respiratory: Tachypnea. Increased work of breathing.  Gastrointestinal: Soft and non tender. No rebound. No guarding.  Genitourinary: Deferred Musculoskeletal: Normal range of motion in all extremities. Bilateral lower extremity edema.  Neurologic:  Normal speech and language. No gross focal neurologic deficits are appreciated.  Skin:  Skin is warm, dry and intact. No rash noted. Psychiatric: Mood and affect are normal. Speech and behavior are normal. Patient exhibits appropriate insight  and judgment.  ____________________________________________    LABS (pertinent positives/negatives)  CBC wbc 11.8, hgb 12.6, plr 154 Trop hs 21 BMP na 129, k 4.9, glu 138, cr 0.71 VBG pH 7.31, pCO2 78 ____________________________________________   EKG  I, Nance Pear, attending physician, personally viewed and interpreted this EKG  EKG Time: 1533 Rate: 75 Rhythm: sinus rhythm Axis: normal Intervals: qtc 438 QRS: narrow, q waves v1, v2 ST changes: no st elevation Impression: abnormal ekg  ____________________________________________    RADIOLOGY  CXR Cardiomegaly. Chronic vascular congestion and bilateral ground glass opacities  ____________________________________________   PROCEDURES  Procedures  ____________________________________________   INITIAL IMPRESSION / ASSESSMENT AND PLAN / ED COURSE  Pertinent labs & imaging results that were available during my care of the patient were reviewed by me and considered in my medical decision making (see chart for details).   Patient presented to the emergency department today because of concerns for shortness of breath.  On chart review the patient has been hospitalized twice this month already for shortness of breath.  Patient was however found to be hypoxic here.  She was placed on nonrebreather which helped her oxygen saturation.  Chest x-ray is consistent with congestive heart failure.  Procalcitonin negative.  At this point I doubt bacterial pneumonia.  I doubt PE.  Patient has swelling in her lower legs although it is bilateral more consistent with CHF.  Will plan on admission and will start Lasix.  ____________________________________________   FINAL CLINICAL IMPRESSION(S) / ED DIAGNOSES  Final diagnoses:  SOB (shortness of breath)  Hypoxia     Note: This dictation was prepared with Dragon dictation. Any transcriptional errors that result from this process are unintentional     Nance Pear,  MD 09/22/19 1857

## 2019-09-22 NOTE — Progress Notes (Signed)
Pt received into room 256 from ED and assist x4 into bed.  Pt oriented to room and call bell.  See assessment and vs's.  Pt alert and oriented x4, O2 at 6 liter [place] w/ O2 sat of 96%.  Dypsnea with activity.    Lungs diminished with right sided expiratory wheezes.  Obese.  LBM today 09/22/19.  Pt provided with graham crackers, PNB, and ensure enlive. Pt c/o back pain when being moved.  Skin benign.  NSR.  +2 pitting edema bilat lower extremeties from hips to feet.  Purewick in place.  Nikki in to do admission data base.

## 2019-09-22 NOTE — ED Notes (Signed)
X-ray at bedside

## 2019-09-23 ENCOUNTER — Inpatient Hospital Stay: Payer: Medicare Other

## 2019-09-23 ENCOUNTER — Encounter: Payer: Self-pay | Admitting: Internal Medicine

## 2019-09-23 DIAGNOSIS — J90 Pleural effusion, not elsewhere classified: Secondary | ICD-10-CM

## 2019-09-23 DIAGNOSIS — R0602 Shortness of breath: Secondary | ICD-10-CM

## 2019-09-23 LAB — CBC WITH DIFFERENTIAL/PLATELET
Abs Immature Granulocytes: 0.02 10*3/uL (ref 0.00–0.07)
Basophils Absolute: 0 10*3/uL (ref 0.0–0.1)
Basophils Relative: 0 %
Eosinophils Absolute: 0 10*3/uL (ref 0.0–0.5)
Eosinophils Relative: 0 %
HCT: 34.3 % — ABNORMAL LOW (ref 36.0–46.0)
Hemoglobin: 11 g/dL — ABNORMAL LOW (ref 12.0–15.0)
Immature Granulocytes: 0 %
Lymphocytes Relative: 8 %
Lymphs Abs: 0.5 10*3/uL — ABNORMAL LOW (ref 0.7–4.0)
MCH: 29.6 pg (ref 26.0–34.0)
MCHC: 32.1 g/dL (ref 30.0–36.0)
MCV: 92.2 fL (ref 80.0–100.0)
Monocytes Absolute: 0.2 10*3/uL (ref 0.1–1.0)
Monocytes Relative: 4 %
Neutro Abs: 4.8 10*3/uL (ref 1.7–7.7)
Neutrophils Relative %: 88 %
Platelets: 135 10*3/uL — ABNORMAL LOW (ref 150–400)
RBC: 3.72 MIL/uL — ABNORMAL LOW (ref 3.87–5.11)
RDW: 13.9 % (ref 11.5–15.5)
WBC: 5.5 10*3/uL (ref 4.0–10.5)
nRBC: 0 % (ref 0.0–0.2)

## 2019-09-23 LAB — HEPATIC FUNCTION PANEL
ALT: 11 U/L (ref 0–44)
AST: 14 U/L — ABNORMAL LOW (ref 15–41)
Albumin: 3.1 g/dL — ABNORMAL LOW (ref 3.5–5.0)
Alkaline Phosphatase: 51 U/L (ref 38–126)
Bilirubin, Direct: <0.1 mg/dL (ref 0.0–0.2)
Total Bilirubin: 0.5 mg/dL (ref 0.3–1.2)
Total Protein: 6.6 g/dL (ref 6.5–8.1)

## 2019-09-23 LAB — LACTATE DEHYDROGENASE, PLEURAL OR PERITONEAL FLUID: LD, Fluid: 87 U/L — ABNORMAL HIGH (ref 3–23)

## 2019-09-23 LAB — BODY FLUID CELL COUNT WITH DIFFERENTIAL
Eos, Fluid: 1 %
Lymphs, Fluid: 70 %
Monocyte-Macrophage-Serous Fluid: 15 %
Neutrophil Count, Fluid: 14 %
Total Nucleated Cell Count, Fluid: 1502 cu mm

## 2019-09-23 LAB — BASIC METABOLIC PANEL
Anion gap: 8 (ref 5–15)
BUN: 21 mg/dL — ABNORMAL HIGH (ref 6–20)
CO2: 38 mmol/L — ABNORMAL HIGH (ref 22–32)
Calcium: 8.4 mg/dL — ABNORMAL LOW (ref 8.9–10.3)
Chloride: 88 mmol/L — ABNORMAL LOW (ref 98–111)
Creatinine, Ser: 0.64 mg/dL (ref 0.44–1.00)
GFR calc Af Amer: >60 mL/min (ref >60)
GFR calc non Af Amer: >60 mL/min (ref >60)
Glucose, Bld: 151 mg/dL — ABNORMAL HIGH (ref 70–99)
Potassium: 4.9 mmol/L (ref 3.5–5.1)
Sodium: 134 mmol/L — ABNORMAL LOW (ref 135–145)

## 2019-09-23 LAB — PROTEIN, PLEURAL OR PERITONEAL FLUID: Total protein, fluid: <3 g/dL

## 2019-09-23 LAB — GLUCOSE, CAPILLARY
Glucose-Capillary: 151 mg/dL — ABNORMAL HIGH (ref 70–99)
Glucose-Capillary: 105 mg/dL — ABNORMAL HIGH (ref 70–99)
Glucose-Capillary: 124 mg/dL — ABNORMAL HIGH (ref 70–99)
Glucose-Capillary: 146 mg/dL — ABNORMAL HIGH (ref 70–99)

## 2019-09-23 LAB — LACTATE DEHYDROGENASE: LDH: 147 U/L (ref 98–192)

## 2019-09-23 NOTE — Procedures (Signed)
Interventional Radiology Procedure:   Indications: Respiratory failure  Procedure: US guided thoracentesis  Findings: Removed 250 ml from right chest.  Complications: None     EBL: Less than 10 ml  Plan: Follow up CXR   Kiyaan Haq R. Anselm Pancoast, MD  Pager: [phone]

## 2019-09-23 NOTE — Progress Notes (Signed)
Progress Note    Natalie Thomas  [id number] DOB: 10/19/1960  DOA: 09/22/2019 PCP: System, Pcp Not In      Brief Narrative:    Medical records reviewed and are as summarized below:  Natalie Thomas is an 59 y.o. female with medical history significant of recent diagnosis of systolic dysfunction CHF, chronic hyponatremia, anxiety disorder, schizoaffective disorder, anemia, hyperlipidemia, hypertension and diabetes who was discharged from the hospital on March 24 to skilled nursing facility after admission for 3 days with acute respiratory failure secondary to hypoxemia from CHF and COPD.  During that admission she had echocardiogram showing EF of 40 to 45%.  Patient discharged on oxygen at 2 L/min.  She was seen by cardiology and home dose Lasix was increased from 20 mg to 40 mg.  Aldactone added at 25 mg.  Patient still to follow-up with cardiology.    She was brought back to to the hospital because of worsening shortness of breath cough and hypoxemia.  She was hypoxemic, requiring 5 L/min oxygen via nasal cannula. Work-up in the ER showed bilateral pleural effusion as well as pulmonary edema.  She was admitted to the hospital for acute exacerbation of chronic systolic CHF and bilateral pleural effusions     Assessment/Plan:   Principal Problem:   Acute on chronic respiratory failure with hypoxia and hypercapnia (HCC) Active Problems:   DM (diabetes mellitus), type 2 (HCC)   Schizoaffective disorder (HCC)   Essential hypertension   COPD (chronic obstructive pulmonary disease) (HCC)   GERD   Hyperlipidemia   Pleural effusion   Acute on chronic respiratory failure with hypoxemia (HCC)   Shortness of breath    acute on chronic respiratory failure with hypoxia and hypercarbia/acute exacerbation of chronic systolic CHF/bilateral pleural effusions:  Ordered left-sided thoracentesis and only to 50 mls of fluid was removed. Continue IV Lasix.  Monitor BMP, daily weights  and urine output.  diabetes: Sliding scale insulin and home regimen  essential hypertension: Continue antihypertensives  schizoaffective disorder: Continue psychotropics  COPD:  Stable  GERD: Continue PPI  hyperlipidemia: Continue with statin    Body mass index is 42.59 kg/m.   Family Communication/Anticipated D/C date and plan/Code Status   DVT prophylaxis: Subcu heparin Code Status: Full code Family Communication: Plan discussed with patient Disposition Plan: Patient is from group home.  Plan to discharge her back to the group home when shortness of breath and hypoxia improve      Subjective:   C/o cough and shortness of breath  Objective:    Vitals:   09/23/19 0451 09/23/19 0736 09/23/19 1150 09/23/19 1623  BP: (!) 142/72 117/75 112/74 120/81  Pulse: 63 71 68 73  Resp: (!) 22 17 17 17   Temp: 98 F (36.7 C) 98.3 F (36.8 C) 97.8 F (36.6 C) 97.8 F (36.6 C)  TempSrc: Oral Oral    SpO2: 98% 99% 98% 98%  Weight: 102.2 kg     Height: 5\' 1"  (1.549 m)       Intake/Output Summary (Last 24 hours) at 09/23/2019 1650 Last data filed at 09/23/2019 1624 Gross per 24 hour  Intake 360 ml  Output 4150 ml  Net -3790 ml   Filed Weights   09/22/19 1531 09/22/19 2248 09/23/19 0451  Weight: 106.7 kg 102.2 kg 102.2 kg    Exam:  GEN: NAD SKIN: Multiple bruises on lower abdomen EYES: EOMI ENT: MMM CV: RRR PULM: Decreased air entry bilaterally, bilateral expiratory wheezing, bibasilar rales ABD:  soft, ND, NT, +BS CNS: AAO x 3, non focal EXT: b/l leg edema (2+ ), no tenderness   Data Reviewed:   I have personally reviewed following labs and imaging studies:  Labs: Labs show the following:   Basic Metabolic Panel: Recent Labs  Lab 09/22/19 1535 09/22/19 2152 09/23/19 0417  NA 129*  --  134*  K 4.9  --  4.9  CL 85*  --  88*  CO2 34*  --  38*  GLUCOSE 138*  --  151*  BUN 18  --  21*  CREATININE 0.71 0.70 0.64  CALCIUM 8.9  --  8.4*    GFR Estimated Creatinine Clearance: 85.2 mL/min (by C-G formula based on SCr of 0.64 mg/dL). Liver Function Tests: Recent Labs  Lab 09/23/19 0923  AST 14*  ALT 11  ALKPHOS 51  BILITOT 0.5  PROT 6.6  ALBUMIN 3.1*   No results for input(s): LIPASE, AMYLASE in the last 168 hours. No results for input(s): AMMONIA in the last 168 hours. Coagulation profile No results for input(s): INR, PROTIME in the last 168 hours.  CBC: Recent Labs  Lab 09/22/19 1535 09/22/19 2152 09/23/19 0417  WBC 11.8* 7.3 5.5  NEUTROABS  --   --  4.8  HGB 12.6 11.8* 11.0*  HCT 39.3 36.5 34.3*  MCV 92.0 92.4 92.2  PLT 154 130* 135*   Cardiac Enzymes: No results for input(s): CKTOTAL, CKMB, CKMBINDEX, TROPONINI in the last 168 hours. BNP (last 3 results) No results for input(s): PROBNP in the last 8760 hours. CBG: Recent Labs  Lab 09/22/19 2156 09/23/19 0735 09/23/19 1151 09/23/19 1622  GLUCAP 197* 151* 105* 124*   D-Dimer: No results for input(s): DDIMER in the last 72 hours. Hgb A1c: No results for input(s): HGBA1C in the last 72 hours. Lipid Profile: No results for input(s): CHOL, HDL, LDLCALC, TRIG, CHOLHDL, LDLDIRECT in the last 72 hours. Thyroid function studies: No results for input(s): TSH, T4TOTAL, T3FREE, THYROIDAB in the last 72 hours.  Invalid input(s): FREET3 Anemia work up: No results for input(s): VITAMINB12, FOLATE, FERRITIN, TIBC, IRON, RETICCTPCT in the last 72 hours. Sepsis Labs: Recent Labs  Lab 09/22/19 1535 09/22/19 1751 09/22/19 1959 09/22/19 2152 09/23/19 0417  PROCALCITON <0.10  --   --   --   --   WBC 11.8*  --   --  7.3 5.5  LATICACIDVEN  --  1.2 1.1  --   --     Microbiology Recent Results (from the past 240 hour(s))  Blood culture (routine x 2)     Status: None (Preliminary result)   Collection Time: 09/22/19  3:35 PM   Specimen: BLOOD  Result Value Ref Range Status   Specimen Description BLOOD BLOOD RIGHT WRIST  Final   Special Requests    Final    BOTTLES DRAWN AEROBIC AND ANAEROBIC Blood Culture adequate volume   Culture   Final    NO GROWTH < 24 HOURS Performed at [facility], [address] [place], [place] [address]    Report Status PENDING  Incomplete  Blood culture (routine x 2)     Status: None (Preliminary result)   Collection Time: 09/22/19  3:36 PM   Specimen: BLOOD  Result Value Ref Range Status   Specimen Description BLOOD BLOOD LEFT WRIST  Final   Special Requests   Final    BOTTLES DRAWN AEROBIC AND ANAEROBIC Blood Culture adequate volume   Culture   Final    NO GROWTH <  24 HOURS Performed at [facility], [address] [place], [place] [address]    Report Status PENDING  Incomplete  Respiratory Panel by RT PCR (Flu A&B, Covid) - Nasopharyngeal Swab     Status: None   Collection Time: 09/22/19  7:59 PM   Specimen: Nasopharyngeal Swab  Result Value Ref Range Status   SARS Coronavirus 2 by RT PCR NEGATIVE NEGATIVE Final    Comment: (NOTE) SARS-CoV-2 target nucleic acids are NOT DETECTED. The SARS-CoV-2 RNA is generally detectable in upper respiratoy specimens during the acute phase of infection. The lowest concentration of SARS-CoV-2 viral copies this assay can detect is 131 copies/mL. A negative result does not preclude SARS-Cov-2 infection and should not be used as the sole basis for treatment or other patient management decisions. A negative result may occur with  improper specimen collection/handling, submission of specimen other than nasopharyngeal swab, presence of viral mutation(s) within the areas targeted by this assay, and inadequate number of viral copies (<131 copies/mL). A negative result must be combined with clinical observations, patient history, and epidemiological information. The expected result is Negative. Fact Sheet for Patients:  PinkCheek.be Fact Sheet for Healthcare Providers:   GravelBags.it This test is not yet ap proved or cleared by the [place] FDA and  has been authorized for detection and/or diagnosis of SARS-CoV-2 by FDA under an Emergency Use Authorization (EUA). This EUA will remain  in effect (meaning this test can be used) for the duration of the COVID-19 declaration under Section 564(b)(1) of the Act, 21 U.S.C. section 360bbb-3(b)(1), unless the authorization is terminated or revoked sooner.    Influenza A by PCR NEGATIVE NEGATIVE Final   Influenza B by PCR NEGATIVE NEGATIVE Final    Comment: (NOTE) The Xpert Xpress SARS-CoV-2/FLU/RSV assay is intended as an aid in  the diagnosis of influenza from Nasopharyngeal swab specimens and  should not be used as a sole basis for treatment. Nasal washings and  aspirates are unacceptable for Xpert Xpress SARS-CoV-2/FLU/RSV  testing. Fact Sheet for Patients: PinkCheek.be Fact Sheet for Healthcare Providers: GravelBags.it This test is not yet approved or cleared by the [place] FDA and  has been authorized for detection and/or diagnosis of SARS-CoV-2 by  FDA under an Emergency Use Authorization (EUA). This EUA will remain  in effect (meaning this test can be used) for the duration of the  Covid-19 declaration under Section 564(b)(1) of the Act, 21  U.S.C. section 360bbb-3(b)(1), unless the authorization is  terminated or revoked. Performed at [facility], [address] [place], [place] [address]     Procedures and diagnostic studies:  DG Chest [facility] 1 View  Result Date: 09/23/2019 CLINICAL DATA:  Provided history: Pleural effusion on right. Status post thoracentesis. EXAM: PORTABLE CHEST 1 VIEW COMPARISON:  Chest radiograph 09/22/2019 FINDINGS: Unchanged cardiomegaly. Aortic atherosclerosis. Similar appearance of pulmonary vascular congestion with interstitial edema. No significant residual right  pleural effusion is evident. Probable persistent small left pleural effusion with left basilar atelectasis. No evidence of pneumothorax. No acute bony abnormality. IMPRESSION: Status post reported right thoracentesis. No residual right pleural effusion is evident. No evidence of pneumothorax. Unchanged cardiomegaly with pulmonary vascular congestion and interstitial edema. Persistent small left pleural effusion with left basilar atelectasis. Electronically Signed   By: Kellie Simmering DO   On: 09/23/2019 14:21   DG Chest Portable 1 View  Result Date: 09/22/2019 CLINICAL DATA:  Difficulty breathing, recent COVID pneumonia EXAM: PORTABLE CHEST 1 VIEW COMPARISON:  09/13/2019 FINDINGS: Single  frontal view of the chest demonstrates persistent enlargement the cardiac silhouette. Chronic bilateral vascular congestion with scattered ground-glass airspace disease and bilateral effusions. No pneumothorax. IMPRESSION: 1. Findings consistent with congestive heart failure. Electronically Signed   By: Randa Ngo M.D.   On: 09/22/2019 16:01   US THORACENTESIS ASP PLEURAL SPACE W/IMG GUIDE  Result Date: 09/23/2019 INDICATION: Acute on chronic respiratory failure. Small amount of pleural fluid on chest radiography. EXAM: ULTRASOUND GUIDED RIGHT THORACENTESIS MEDICATIONS: None. COMPLICATIONS: None immediate. PROCEDURE: An ultrasound guided thoracentesis was thoroughly discussed with the patient and questions answered. The benefits, risks, alternatives and complications were also discussed. The patient understands and wishes to proceed with the procedure. Written consent was obtained. Ultrasound was performed to localize and mark an adequate pocket of fluid in the right chest. The area was then prepped and draped in the normal sterile fashion. 1% Lidocaine was used for local anesthesia. Under ultrasound guidance a 6 Fr Safe-T-Centesis catheter was introduced. Thoracentesis was performed. The catheter was removed and a  dressing applied. FINDINGS: A total of approximately 250 of amber fluid was removed. Samples were sent to the laboratory as requested by the clinical team. No significant left pleural fluid identified with ultrasound. Follow-up chest radiograph was negative for a pneumothorax. IMPRESSION: Successful ultrasound guided right thoracentesis yielding 250 mL of pleural fluid. Electronically Signed   By: Markus Daft M.D.   On: 09/23/2019 16:22    Medications:   . ascorbic acid  1,000 mg Oral Daily  . buPROPion  150 mg Oral Daily  . calcium-vitamin D  1 tablet Oral Daily  . clonazePAM  1 mg Oral BID  . docusate sodium  100 mg Oral BID  . feeding supplement (ENSURE ENLIVE)  237 mL Oral Q24H  . fludrocortisone  0.1 mg Oral Daily  . fluticasone  2 spray Each Nare BID  . fluticasone furoate-vilanterol  1 puff Inhalation Daily  . furosemide  40 mg Intravenous BID  . guaiFENesin  1,200 mg Oral Daily  . heparin  5,000 Units Subcutaneous Q8H  . insulin aspart  0-15 Units Subcutaneous TID WC  . insulin aspart  0-5 Units Subcutaneous QHS  . loratadine  10 mg Oral Daily  . lurasidone  20 mg Oral BID  . melatonin  5 mg Oral QHS  . multivitamin with minerals  1 tablet Oral Daily  . nicotine  21 mg Transdermal Daily  . NIFEdipine  90 mg Oral Daily  . OLANZapine  10 mg Oral BID  . pantoprazole  40 mg Oral Daily  . polyethylene glycol  17 g Oral Daily  . sodium chloride flush  3 mL Intravenous Q12H  . spironolactone  25 mg Oral Daily  . sucralfate  1 g Oral TID WC & HS  . valproic acid  500 mg Oral TID  . zinc sulfate  220 mg Oral Daily   Continuous Infusions: . sodium chloride       LOS: 1 day   Natalie Thomas  [facility]     09/23/2019, 4:50 PM

## 2019-09-23 NOTE — TOC Initial Note (Signed)
Transition of Care [facility]) - Initial/Assessment Note    Patient Details  Name: Natalie Thomas MRN: [id number] Date of Birth: [birth year]  [facility]) CM/SW Contact:    Victorino Dike, RN Phone Number: 09/23/2019, 10:35 AM  Clinical Narrative:                   Met with patient in room, she is pleasant and talkative.  Affect is child-like.  She reports being a resident at [facility],  She reports having a guardian named Arts development officer.  Facility manages her medications and monitors her vital signs.   Patient's guardian is aware that patient is non-compliant with regimen and continues to come to hospital and request being moved to a group home or a different SNF.  Reports of patient being pleasant and agreeable to care until ready for discharge and can become very agitated and upset at that time. Reported this is typical behavior.  Expected Discharge Plan: Skilled Nursing Facility Barriers to Discharge: Continued Medical Work up   Patient Goals and CMS Choice     Choice offered to / list presented to : [place] / Guardian  Expected Discharge Plan and Services Expected Discharge Plan: [place]   Discharge Planning Services: CM Consult Post Acute Care Choice: [place] Living arrangements for the past 2 months: [place]                                      Prior Living Arrangements/Services Living arrangements for the past 2 months: [place] Lives with:: Facility Resident Patient language and need for interpreter reviewed:: Yes Do you feel safe going back to the place where you live?: Yes      Need for Family Participation in Patient Care: No (Comment) Care giver support system in place?: No (comment)   Criminal Activity/Legal Involvement Pertinent to Current Situation/Hospitalization: No - Comment as needed  Activities of Daily Living Home Assistive Devices/Equipment:  Oxygen ADL Screening (condition at time of admission) Patient's cognitive ability adequate to safely complete daily activities?: Yes Is the patient deaf or have difficulty hearing?: No Does the patient have difficulty seeing, even when wearing glasses/contacts?: No Does the patient have difficulty concentrating, remembering, or making decisions?: No Patient able to express need for assistance with ADLs?: Yes Does the patient have difficulty dressing or bathing?: Yes Independently performs ADLs?: No Does the patient have difficulty walking or climbing stairs?: Yes Weakness of Legs: None Weakness of Arms/Hands: None  Permission Sought/Granted Permission sought to share information with : Other (comment)                Emotional Assessment Appearance:: Appears older than stated age Attitude/Demeanor/Rapport: Engaged Affect (typically observed): Accepting Orientation: : Oriented to Self, Oriented to Place, Oriented to  Time, Oriented to Situation Alcohol / Substance Use: Not Applicable Psych Involvement: No (comment)  Admission diagnosis:  Shortness of breath [R06.02] SOB (shortness of breath) [R06.02] Hypoxia [R09.02] Acute on chronic respiratory failure with hypoxemia (HCC) [J96.21] Patient Active Problem List   Diagnosis Date Noted  . Shortness of breath 09/22/2019  . Acute on chronic respiratory failure with hypercapnia ([place]) 09/13/2019  . Leukocytosis 09/13/2019  . Obesity, Class III, BMI 40-49.9 (morbid obesity) ([place])   . Acute on chronic respiratory failure with hypoxemia ([place]) 09/04/2019  . COVID-19 virus infection   . 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 Nausea   . Acute gastritis   . Abdominal pain, chronic, epigastric   . Hyponatremia 07/09/2019  . Ventral hernia 07/09/2019  . Hypertensive crisis 05/01/2019  . Acute abdominal pain 03/15/2019  . Pleural effusion 03/13/2019  . Atelectasis 03/13/2019  . Chronic diastolic CHF (congestive heart failure) ([place])  03/13/2019  . Type 2 diabetes mellitus without complication ([place]) 42/99/8069  . Urinary tract bacterial infections 03/09/2019  . Acute cystitis without hematuria   . Constipation   . Acute hypoxemic respiratory failure ([place]) 12/03/2018  . Irritability 11/09/2018  . Aggressive behavior 11/09/2018  . Agitation   . S/P right hip fracture 11/07/2017  . Closed comminuted intertrochanteric fracture of right femur ([place]) 11/07/2017  . Anxiety and depression   . Dyspnea 10/16/2017  . Anemia 10/16/2017  . COPD exacerbation ([place]) 10/07/2017  . Hyperlipidemia 10/07/2017  . Acute on chronic respiratory failure with hypoxia and hypercapnia ([employer]) 10/07/2017  . Incontinence of urine 08/14/2011  . Schizoaffective disorder, bipolar type ([place]) 07/05/2011  . Cough 05/08/2011  . Lumbar disc disease 04/09/2011  . Polycythemia secondary to smoking 04/09/2011  . HIP PAIN, LEFT 12/12/2007  . 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 DM (diabetes mellitus), type 2 ([place]) 04/08/2007  . MENOPAUSE, PREMATURE 04/08/2007  . Chronic hyponatremia 04/08/2007  . Schizoaffective disorder ([place]) 04/08/2007  . Essential hypertension 04/08/2007  . COPD (chronic obstructive pulmonary disease) ([place]) 04/08/2007  . GERD 04/08/2007  . Osteoporosis 04/08/2007   PCP:  System, Pcp Not In Pharmacy:   [place], [place] [address] Phone: [phone] Fax: [phone]     Social Determinants of Health (SDOH) Interventions    Readmission Risk Interventions Readmission Risk Prevention Plan 09/15/2019 09/08/2019 07/10/2019  Transportation Screening Complete Complete Complete  Medication Review Press photographer) Complete Complete Referral to Pharmacy  PCP or Specialist appointment within 3-5 days of discharge Complete Complete Complete  HRI or Home Care Consult Complete Complete Not Complete  HRI or Home Care Consult Pt Refusal Comments - - pt is resident of [facility]  SW Recovery Care/Counseling Consult - Complete Complete  Palliative Care Screening - Not Applicable Not [place] Complete Complete Not Applicable  Some recent data might be hidden

## 2019-09-24 ENCOUNTER — Ambulatory Visit: Payer: Medicare Other | Admitting: Family

## 2019-09-24 DIAGNOSIS — F250 Schizoaffective disorder, bipolar type: Secondary | ICD-10-CM

## 2019-09-24 LAB — PROTEIN, BODY FLUID (OTHER): Total Protein, Body Fluid Other: 1.6 g/dL

## 2019-09-24 LAB — BASIC METABOLIC PANEL
Anion gap: 10 (ref 5–15)
BUN: 22 mg/dL — ABNORMAL HIGH (ref 6–20)
CO2: 39 mmol/L — ABNORMAL HIGH (ref 22–32)
Calcium: 8.6 mg/dL — ABNORMAL LOW (ref 8.9–10.3)
Chloride: 84 mmol/L — ABNORMAL LOW (ref 98–111)
Creatinine, Ser: 0.54 mg/dL (ref 0.44–1.00)
GFR calc Af Amer: >60 mL/min (ref >60)
GFR calc non Af Amer: >60 mL/min (ref >60)
Glucose, Bld: 99 mg/dL (ref 70–99)
Potassium: 4.4 mmol/L (ref 3.5–5.1)
Sodium: 133 mmol/L — ABNORMAL LOW (ref 135–145)

## 2019-09-24 LAB — GLUCOSE, CAPILLARY
Glucose-Capillary: 69 mg/dL — ABNORMAL LOW (ref 70–99)
Glucose-Capillary: 100 mg/dL — ABNORMAL HIGH (ref 70–99)
Glucose-Capillary: 162 mg/dL — ABNORMAL HIGH (ref 70–99)
Glucose-Capillary: 133 mg/dL — ABNORMAL HIGH (ref 70–99)

## 2019-09-24 MED ORDER — CALCIUM CARBONATE ANTACID 500 MG PO CHEW
1.0000 | CHEWABLE_TABLET | Freq: Three times a day (TID) | ORAL | Status: AC
  Administered 2019-09-24 – 2019-09-26 (×5): 200 mg via ORAL
  Filled 2019-09-24 (×5): qty 1

## 2019-09-24 NOTE — Progress Notes (Signed)
Inpatient Diabetes Program Recommendations  AACE/ADA: New Consensus Statement on Inpatient Glycemic Control (2015)  Target Ranges:  Prepandial:   less than 140 mg/dL      Peak postprandial:   less than 180 mg/dL (1-2 hours)      Critically ill patients:  140 - 180 mg/dL   Lab Results  Component Value Date   GLUCAP 100 (H) 09/24/2019   HGBA1C 5.0 09/04/2019    Review of Glycemic Control Results for Natalie Thomas, Natalie Thomas (MRN [id number]) as of 09/24/2019 11:45  Ref. Range 09/23/2019 11:51 09/23/2019 16:22 09/23/2019 21:04 09/24/2019 07:32 09/24/2019 11:32  Glucose-Capillary Latest Ref Range: 70 - 99 mg/dL 105 (H) 124 (H) 146 (H) 69 (L) 100 (H)   Diabetes history: DM 2 Outpatient Diabetes medications: No home DM medications listed Current orders for Inpatient glycemic control:  Novolog moderate tid with meals and HS Inpatient Diabetes Program Recommendations:   Blood sugars less than 150 mg/dL.  Fasting CBG=69 mg/dL.  Does not appear to need insulin in the hospital.  ? D/c Novolog correction.   Thanks  Adah Perl, RN, BC-ADM Inpatient Diabetes Coordinator Pager [phone] (8a-5p)

## 2019-09-24 NOTE — Progress Notes (Addendum)
Progress Note    Natalie Thomas  [id number] DOB: Oct 21, 1960  DOA: 09/22/2019 PCP: System, Pcp Not In      Brief Narrative:    Medical records reviewed and are as summarized below:  Natalie Thomas is an 59 y.o. female with medical history significant of recent diagnosis of systolic dysfunction CHF, chronic hyponatremia, anxiety disorder, schizoaffective disorder, anemia, hyperlipidemia, hypertension and diabetes who was discharged from the hospital on March 24 to skilled nursing facility after admission for 3 days with acute respiratory failure secondary to hypoxemia from CHF and COPD.  During that admission she had echocardiogram showing EF of 40 to 45%.  Patient discharged on oxygen at 2 L/min.  She was seen by cardiology and home dose Lasix was increased from 20 mg to 40 mg.  Aldactone added at 25 mg.  Patient still to follow-up with cardiology.    She was brought back to to the hospital because of worsening shortness of breath cough and hypoxemia.  She was hypoxemic, requiring 5 L/min oxygen via nasal cannula. Work-up in the ER showed bilateral pleural effusion as well as pulmonary edema.  She was admitted to the hospital for acute exacerbation of chronic systolic CHF and bilateral pleural effusions     Assessment/Plan:   Principal Problem:   Acute on chronic combined systolic and diastolic CHF (congestive heart failure) (HCC) Active Problems:   DM (diabetes mellitus), type 2 (HCC)   Schizoaffective disorder (HCC)   Essential hypertension   COPD (chronic obstructive pulmonary disease) (HCC)   GERD   Hyperlipidemia   Acute on chronic respiratory failure with hypoxia and hypercapnia (HCC)   Pleural effusion   Acute on chronic respiratory failure with hypoxemia (HCC)   Shortness of breath    acute on chronic respiratory failure with hypoxia and hypercarbia/acute exacerbation of chronic systolic CHF/bilateral pleural effusions:  S/p left-sided thoracentesis on  09/23/2019 and only 250 mls of transudative fluid was removed. Continue IV Lasix.  Monitor BMP, daily weights and urine output.  Type 2 diabetes mellitus with mild hyperglycemia: Glucose level was 69 this morning.  Encouraged oral intake.  Discontinue sliding scale insulin continue to monitor.  essential hypertension: Continue antihypertensives  schizoaffective disorder: Continue psychotropics  COPD:  Stable.  Continue bronchodilators  GERD: Continue PPI  hyperlipidemia: Continue with statin   Body mass index is 42.15 kg/m.  (morbid obesity)   Family Communication/Anticipated D/C date and plan/Code Status   DVT prophylaxis: Subcu heparin Code Status: Full code Family Communication: Plan discussed with patient Disposition Plan: Patient is from group home.  Plan to discharge her back to the group home when shortness of breath and hypoxia improve      Subjective:   C/o shortness of breath  Objective:    Vitals:   09/23/19 1951 09/24/19 0434 09/24/19 0731 09/24/19 1131  BP: (!) 121/96 132/84 121/70 100/73  Pulse: 78 67 62 73  Resp:   14 16  Temp: 98.6 F (37 C) 97.7 F (36.5 C) 98.2 F (36.8 C) 98.1 F (36.7 C)  TempSrc: Oral Oral    SpO2: 97% 98% 100% 98%  Weight:  101.2 kg    Height:        Intake/Output Summary (Last 24 hours) at 09/24/2019 1622 Last data filed at 09/24/2019 1438 Gross per 24 hour  Intake 240 ml  Output 6300 ml  Net -6060 ml   Filed Weights   09/22/19 2248 09/23/19 0451 09/24/19 0434  Weight: 102.2 kg 102.2  kg 101.2 kg    Exam:  GEN: NAD SKIN: Multiple bruises on lower abdomen EYES: EOMI ENT: MMM CV: RRR PULM: Decreased air entry bilaterally, bilateral expiratory wheezing, bibasilar rales ABD: soft, obese, NT, +BS CNS: AAO x 3, non focal EXT: b/l leg edema (2+ ), left greater than right, no tenderness   Data Reviewed:   I have personally reviewed following labs and imaging studies:  Labs: Labs show the following:    Basic Metabolic Panel: Recent Labs  Lab 09/22/19 1535 09/22/19 1535 09/22/19 2152 09/23/19 0417 09/24/19 0454  NA 129*  --   --  134* 133*  K 4.9   < >  --  4.9 4.4  CL 85*  --   --  88* 84*  CO2 34*  --   --  38* 39*  GLUCOSE 138*  --   --  151* 99  BUN 18  --   --  21* 22*  CREATININE 0.71  --  0.70 0.64 0.54  CALCIUM 8.9  --   --  8.4* 8.6*   < > = values in this interval not displayed.   GFR Estimated Creatinine Clearance: 84.8 mL/min (by C-G formula based on SCr of 0.54 mg/dL). Liver Function Tests: Recent Labs  Lab 09/23/19 0923  AST 14*  ALT 11  ALKPHOS 51  BILITOT 0.5  PROT 6.6  ALBUMIN 3.1*   No results for input(s): LIPASE, AMYLASE in the last 168 hours. No results for input(s): AMMONIA in the last 168 hours. Coagulation profile No results for input(s): INR, PROTIME in the last 168 hours.  CBC: Recent Labs  Lab 09/22/19 1535 09/22/19 2152 09/23/19 0417  WBC 11.8* 7.3 5.5  NEUTROABS  --   --  4.8  HGB 12.6 11.8* 11.0*  HCT 39.3 36.5 34.3*  MCV 92.0 92.4 92.2  PLT 154 130* 135*   Cardiac Enzymes: No results for input(s): CKTOTAL, CKMB, CKMBINDEX, TROPONINI in the last 168 hours. BNP (last 3 results) No results for input(s): PROBNP in the last 8760 hours. CBG: Recent Labs  Lab 09/23/19 1151 09/23/19 1622 09/23/19 2104 09/24/19 0732 09/24/19 1132  GLUCAP 105* 124* 146* 69* 100*   D-Dimer: No results for input(s): DDIMER in the last 72 hours. Hgb A1c: No results for input(s): HGBA1C in the last 72 hours. Lipid Profile: No results for input(s): CHOL, HDL, LDLCALC, TRIG, CHOLHDL, LDLDIRECT in the last 72 hours. Thyroid function studies: No results for input(s): TSH, T4TOTAL, T3FREE, THYROIDAB in the last 72 hours.  Invalid input(s): FREET3 Anemia work up: No results for input(s): VITAMINB12, FOLATE, FERRITIN, TIBC, IRON, RETICCTPCT in the last 72 hours. Sepsis Labs: Recent Labs  Lab 09/22/19 1535 09/22/19 1751 09/22/19 1959  09/22/19 2152 09/23/19 0417  PROCALCITON <0.10  --   --   --   --   WBC 11.8*  --   --  7.3 5.5  LATICACIDVEN  --  1.2 1.1  --   --     Microbiology Recent Results (from the past 240 hour(s))  Blood culture (routine x 2)     Status: None (Preliminary result)   Collection Time: 09/22/19  3:35 PM   Specimen: BLOOD  Result Value Ref Range Status   Specimen Description BLOOD BLOOD RIGHT WRIST  Final   Special Requests   Final    BOTTLES DRAWN AEROBIC AND ANAEROBIC Blood Culture adequate volume   Culture   Final    NO GROWTH 2 DAYS Performed at [facility], [address] [place], [place] [address]    Report Status PENDING  Incomplete  Blood culture (routine x 2)     Status: None (Preliminary result)   Collection Time: 09/22/19  3:36 PM   Specimen: BLOOD  Result Value Ref Range Status   Specimen Description BLOOD BLOOD LEFT WRIST  Final   Special Requests   Final    BOTTLES DRAWN AEROBIC AND ANAEROBIC Blood Culture adequate volume   Culture   Final    NO GROWTH 2 DAYS Performed at [facility], [address] [place], [place] [address]    Report Status PENDING  Incomplete  Respiratory Panel by RT PCR (Flu A&B, Covid) - Nasopharyngeal Swab     Status: None   Collection Time: 09/22/19  7:59 PM   Specimen: Nasopharyngeal Swab  Result Value Ref Range Status   SARS Coronavirus 2 by RT PCR NEGATIVE NEGATIVE Final    Comment: (NOTE) SARS-CoV-2 target nucleic acids are NOT DETECTED. The SARS-CoV-2 RNA is generally detectable in upper respiratoy specimens during the acute phase of infection. The lowest concentration of SARS-CoV-2 viral copies this assay can detect is 131 copies/mL. A negative result does not preclude SARS-Cov-2 infection and should not be used as the sole basis for treatment or other patient management decisions. A negative result may occur with  improper specimen collection/handling, submission of specimen other than nasopharyngeal swab,  presence of viral mutation(s) within the areas targeted by this assay, and inadequate number of viral copies (<131 copies/mL). A negative result must be combined with clinical observations, patient history, and epidemiological information. The expected result is Negative. Fact Sheet for Patients:  PinkCheek.be Fact Sheet for Healthcare Providers:  GravelBags.it This test is not yet ap proved or cleared by the [place] FDA and  has been authorized for detection and/or diagnosis of SARS-CoV-2 by FDA under an Emergency Use Authorization (EUA). This EUA will remain  in effect (meaning this test can be used) for the duration of the COVID-19 declaration under Section 564(b)(1) of the Act, 21 U.S.C. section 360bbb-3(b)(1), unless the authorization is terminated or revoked sooner.    Influenza A by PCR NEGATIVE NEGATIVE Final   Influenza B by PCR NEGATIVE NEGATIVE Final    Comment: (NOTE) The Xpert Xpress SARS-CoV-2/FLU/RSV assay is intended as an aid in  the diagnosis of influenza from Nasopharyngeal swab specimens and  should not be used as a sole basis for treatment. Nasal washings and  aspirates are unacceptable for Xpert Xpress SARS-CoV-2/FLU/RSV  testing. Fact Sheet for Patients: PinkCheek.be Fact Sheet for Healthcare Providers: GravelBags.it This test is not yet approved or cleared by the [place] FDA and  has been authorized for detection and/or diagnosis of SARS-CoV-2 by  FDA under an Emergency Use Authorization (EUA). This EUA will remain  in effect (meaning this test can be used) for the duration of the  Covid-19 declaration under Section 564(b)(1) of the Act, 21  U.S.C. section 360bbb-3(b)(1), unless the authorization is  terminated or revoked. Performed at [facility], [address] [place], [place] [address]   Body fluid culture      Status: None (Preliminary result)   Collection Time: 09/23/19  2:04 PM   Specimen: PATH Cytology Pleural fluid  Result Value Ref Range Status   Specimen Description   Final    PLEURAL Performed at [facility], [address], [place], [place] [address]    Special Requests   Final    NONE Performed at [facility], [place], [place] [address]    Gram Stain   Final    MODERATE WBC PRESENT, PREDOMINANTLY MONONUCLEAR NO ORGANISMS SEEN    Culture   Final    NO GROWTH < 12 HOURS Performed at [facility] [address] [place], [place] [address]    Report Status PENDING  Incomplete    Procedures and diagnostic studies:  DG Chest Port 1 View  Result Date: 09/23/2019 CLINICAL DATA:  Provided history: Pleural effusion on right. Status post thoracentesis. EXAM: PORTABLE CHEST 1 VIEW COMPARISON:  Chest radiograph 09/22/2019 FINDINGS: Unchanged cardiomegaly. Aortic atherosclerosis. Similar appearance of pulmonary vascular congestion with interstitial edema. No significant residual right pleural effusion is evident. Probable persistent small left pleural effusion with left basilar atelectasis. No evidence of pneumothorax. No acute bony abnormality. IMPRESSION: Status post reported right thoracentesis. No residual right pleural effusion is evident. No evidence of pneumothorax. Unchanged cardiomegaly with pulmonary vascular congestion and interstitial edema. Persistent small left pleural effusion with left basilar atelectasis. Electronically Signed   By: Kellie Simmering DO   On: 09/23/2019 14:21   US THORACENTESIS ASP PLEURAL SPACE W/IMG GUIDE  Result Date: 09/23/2019 INDICATION: Acute on chronic respiratory failure. Small amount of pleural fluid on chest radiography. EXAM: ULTRASOUND GUIDED RIGHT THORACENTESIS MEDICATIONS: None. COMPLICATIONS: None immediate. PROCEDURE: An ultrasound guided thoracentesis was thoroughly discussed with the patient and questions  answered. The benefits, risks, alternatives and complications were also discussed. The patient understands and wishes to proceed with the procedure. Written consent was obtained. Ultrasound was performed to localize and mark an adequate pocket of fluid in the right chest. The area was then prepped and draped in the normal sterile fashion. 1% Lidocaine was used for local anesthesia. Under ultrasound guidance a 6 Fr Safe-T-Centesis catheter was introduced. Thoracentesis was performed. The catheter was removed and a dressing applied. FINDINGS: A total of approximately 250 of amber fluid was removed. Samples were sent to the laboratory as requested by the clinical team. No significant left pleural fluid identified with ultrasound. Follow-up chest radiograph was negative for a pneumothorax. IMPRESSION: Successful ultrasound guided right thoracentesis yielding 250 mL of pleural fluid. Electronically Signed   By: Markus Daft M.D.   On: 09/23/2019 16:22    Medications:   . ascorbic acid  1,000 mg Oral Daily  . buPROPion  150 mg Oral Daily  . calcium-vitamin D  1 tablet Oral Daily  . clonazePAM  1 mg Oral BID  . docusate sodium  100 mg Oral BID  . feeding supplement (ENSURE ENLIVE)  237 mL Oral Q24H  . fludrocortisone  0.1 mg Oral Daily  . fluticasone  2 spray Each Nare BID  . fluticasone furoate-vilanterol  1 puff Inhalation Daily  . furosemide  40 mg Intravenous BID  . guaiFENesin  1,200 mg Oral Daily  . heparin  5,000 Units Subcutaneous Q8H  . insulin aspart  0-15 Units Subcutaneous TID WC  . insulin aspart  0-5 Units Subcutaneous QHS  . loratadine  10 mg Oral Daily  . lurasidone  20 mg Oral BID  . melatonin  5 mg Oral QHS  . multivitamin with minerals  1 tablet Oral Daily  . nicotine  21 mg Transdermal Daily  . NIFEdipine  90 mg Oral Daily  . OLANZapine  10 mg Oral BID  . pantoprazole  40 mg Oral Daily  . polyethylene glycol  17 g Oral Daily  . sodium chloride flush  3 mL Intravenous  Q12H  .  spironolactone  25 mg Oral Daily  . sucralfate  1 g Oral TID WC & HS  . valproic acid  500 mg Oral TID  . zinc sulfate  220 mg Oral Daily   Continuous Infusions: . sodium chloride       LOS: 2 days   Kaylany Tesoriero  [facility]     09/24/2019, 4:22 PM

## 2019-09-25 DIAGNOSIS — I5043 Acute on chronic combined systolic (congestive) and diastolic (congestive) heart failure: Secondary | ICD-10-CM

## 2019-09-25 LAB — BASIC METABOLIC PANEL
Anion gap: 9 (ref 5–15)
BUN: 23 mg/dL — ABNORMAL HIGH (ref 6–20)
CO2: 42 mmol/L — ABNORMAL HIGH (ref 22–32)
Calcium: 8.8 mg/dL — ABNORMAL LOW (ref 8.9–10.3)
Chloride: 80 mmol/L — ABNORMAL LOW (ref 98–111)
Creatinine, Ser: 0.63 mg/dL (ref 0.44–1.00)
GFR calc Af Amer: >60 mL/min (ref >60)
GFR calc non Af Amer: >60 mL/min (ref >60)
Glucose, Bld: 104 mg/dL — ABNORMAL HIGH (ref 70–99)
Potassium: 4.8 mmol/L (ref 3.5–5.1)
Sodium: 131 mmol/L — ABNORMAL LOW (ref 135–145)

## 2019-09-25 LAB — GLUCOSE, CAPILLARY
Glucose-Capillary: 88 mg/dL (ref 70–99)
Glucose-Capillary: 103 mg/dL — ABNORMAL HIGH (ref 70–99)
Glucose-Capillary: 94 mg/dL (ref 70–99)
Glucose-Capillary: 130 mg/dL — ABNORMAL HIGH (ref 70–99)

## 2019-09-25 LAB — CYTOLOGY - NON PAP

## 2019-09-25 MED ORDER — FUROSEMIDE 40 MG PO TABS
40.0000 mg | ORAL_TABLET | Freq: Two times a day (BID) | ORAL | Status: DC
  Administered 2019-09-25 – 2019-09-26 (×3): 40 mg via ORAL
  Filled 2019-09-25 (×2): qty 1

## 2019-09-25 NOTE — Plan of Care (Signed)
  Problem: Health Behavior/Discharge Planning: Goal: Ability to manage health-related needs will improve Outcome: Not Progressing   

## 2019-09-25 NOTE — Plan of Care (Signed)
  Problem: Education: Goal: Knowledge of General Education information will improve Description: Including pain rating scale, medication(s)/side effects and non-pharmacologic comfort measures Outcome: Progressing   Problem: Health Behavior/Discharge Planning: Goal: Ability to manage health-related needs will improve Outcome: Progressing   Problem: Clinical Measurements: Goal: Ability to maintain clinical measurements within normal limits will improve Outcome: Progressing Goal: Will remain free from infection Outcome: Progressing Goal: Diagnostic test results will improve Outcome: Progressing Goal: Respiratory complications will improve Outcome: Progressing Goal: Cardiovascular complication will be avoided Outcome: Progressing   Problem: Activity: Goal: Risk for activity intolerance will decrease Outcome: Progressing   Problem: Nutrition: Goal: Adequate nutrition will be maintained Outcome: Progressing   Problem: Coping: Goal: Level of anxiety will decrease Outcome: Progressing   Problem: Elimination: Goal: Will not experience complications related to bowel motility Outcome: Progressing Goal: Will not experience complications related to urinary retention Outcome: Progressing   Problem: Pain Managment: Goal: General experience of comfort will improve Outcome: Progressing   Problem: Safety: Goal: Ability to remain free from injury will improve Outcome: Progressing   Problem: Skin Integrity: Goal: Risk for impaired skin integrity will decrease Outcome: Progressing   Problem: Education: Goal: Knowledge of General Education information will improve Description: Including pain rating scale, medication(s)/side effects and non-pharmacologic comfort measures Outcome: Progressing   Problem: Health Behavior/Discharge Planning: Goal: Ability to manage health-related needs will improve Outcome: Progressing   Problem: Clinical Measurements: Goal: Ability to maintain  clinical measurements within normal limits will improve Outcome: Progressing Goal: Will remain free from infection Outcome: Progressing Goal: Diagnostic test results will improve Outcome: Progressing Goal: Respiratory complications will improve Outcome: Progressing Goal: Cardiovascular complication will be avoided Outcome: Progressing   Problem: Activity: Goal: Risk for activity intolerance will decrease Outcome: Progressing   Problem: Nutrition: Goal: Adequate nutrition will be maintained Outcome: Progressing   Problem: Coping: Goal: Level of anxiety will decrease Outcome: Progressing   Problem: Elimination: Goal: Will not experience complications related to bowel motility Outcome: Progressing Goal: Will not experience complications related to urinary retention Outcome: Progressing   Problem: Pain Managment: Goal: General experience of comfort will improve Outcome: Progressing   Problem: Safety: Goal: Ability to remain free from injury will improve Outcome: Progressing   Problem: Skin Integrity: Goal: Risk for impaired skin integrity will decrease Outcome: Progressing   Problem: Education: Goal: Ability to demonstrate management of disease process will improve Outcome: Progressing Goal: Ability to verbalize understanding of medication therapies will improve Outcome: Progressing Goal: Individualized Educational Video(s) Outcome: Progressing   Problem: Activity: Goal: Capacity to carry out activities will improve Outcome: Progressing   Problem: Cardiac: Goal: Ability to achieve and maintain adequate cardiopulmonary perfusion will improve Outcome: Progressing   

## 2019-09-25 NOTE — Progress Notes (Signed)
OT Cancellation Note  Patient Details Name: VELVIA CIRESI MRN: [id number] DOB: [birth year]   Cancelled Treatment:    Reason Eval/Treat Not Completed: Other (comment)   Patient currently eating lunch.  Will follow up as appropriate and available.  Thank you.  Oren Binet 09/25/2019, 11:42 AM

## 2019-09-25 NOTE — Evaluation (Signed)
Physical Therapy Evaluation Patient Details Name: Natalie Thomas MRN: [id number] DOB: Nov 13, 1960 Today's Date: 09/25/2019   History of Present Illness  Pt is a 59 y.o. female presenting to hospital 09/22/19 with SOB.  Pt admitted with acute on chronic respiratory failure with hypoxia and hypercarbia, acute exacerbation systolic CHF, and B pleural effusion.  S/p thoracentesis 3/31.  PMH includes anemia, CHF, COPD, h/o R femur fx s/p IMN, DM, COVID-19, schizoaffective disorder.  Clinical Impression  Pt resting in recliner on 4.5 L O2 via nasal cannula upon PT arrival.  Prior to hospital admission, pt reports using manual w/c mostly but does get up to walk with RW and assist at times at facility.  Currently pt is CGA with transfers and ambulation 40 feet with RW; limited distance ambulating d/t SOB (O2 sats on 4 L O2 via nasal cannula decreased to 88% post ambulation but improved to 95-96% with sitting rest break and vc's for pursed lip breathing).  Pt would benefit from skilled PT to address noted impairments and functional limitations (see below for any additional details).  Upon hospital discharge, pt would benefit from HHPT at facility.    Follow Up Recommendations Supervision for mobility/OOB(DC back to facility with HHPT)    Equipment Recommendations  Rolling walker with 5" wheels;3in1 (PT)    Recommendations for Other Services OT consult     Precautions / Restrictions Precautions Precautions: Fall;Other (comment)(MRSA) Restrictions Weight Bearing Restrictions: No      Mobility  Bed Mobility     General bed mobility comments: Deferred (pt recently got OOB)  Transfers Overall transfer level: Needs assistance Equipment used: Rolling walker (2 wheeled) Transfers: Sit to/from Stand Sit to Stand: Min guard Stand pivot transfers: Min guard       General transfer comment: mild increased effort to stand from recliner on own; steady controlled descent noted sitting in recliner  post ambulation  Ambulation/Gait Ambulation/Gait assistance: Min guard Gait Distance (Feet): 40 Feet Assistive device: Rolling walker (2 wheeled)   Gait velocity: decreased   General Gait Details: partial step through gait pattern; increased BOS; steady with RW use  Stairs            Wheelchair Mobility    Modified Rankin (Stroke Patients Only)       Balance Overall balance assessment: Needs assistance Sitting-balance support: No upper extremity supported;Feet supported Sitting balance-Leahy Scale: Good Sitting balance - Comments: steady sitting reaching within BOS   Standing balance support: Single extremity supported Standing balance-Leahy Scale: Fair Standing balance comment: pt requiring at least single UE support for static standing balance                             Pertinent Vitals/Pain Pain Assessment: No/denies pain(pt reports itching under bandage on back (nurse present and aware)).  HR WFL during sessions activities.    Home Living Family/patient expects to be discharged to:: Skilled nursing facility                 Additional Comments: Resident of [facility]    Prior Function Level of Independence: Needs assistance   Gait / Transfers Assistance Needed: Pt reports using w/c mostly (propels with UE's/LE's on own) but also walks short distances with RW and assist  ADL's / Homemaking Assistance Needed: Pt reports needing assist with her care        Hand Dominance   Dominant Hand: Right    Extremity/Trunk Assessment  Upper Extremity Assessment Upper Extremity Assessment: Overall WFL for tasks assessed    Lower Extremity Assessment Lower Extremity Assessment: Generalized weakness    Cervical / Trunk Assessment Cervical / Trunk Assessment: (forward head/shoulders)  Communication   Communication: No difficulties  Cognition Arousal/Alertness: Awake/alert Behavior During Therapy: WFL for tasks  assessed/performed Overall Cognitive Status: No family/caregiver present to determine baseline cognitive functioning                                 General Comments: Mild confusion noted at times.  Oriented to person, place, and situation.      General Comments General comments (skin integrity, edema, etc.): pt noted to prefer breathing through mouth vs nose requiring cueing for pursed lip breathing    Exercises    Assessment/Plan    PT Assessment Patient needs continued PT services  PT Problem List Decreased strength;Decreased activity tolerance;Decreased balance;Decreased mobility;Cardiopulmonary status limiting activity       PT Treatment Interventions DME instruction;Gait training;Functional mobility training;Therapeutic activities;Therapeutic exercise;Balance training;Patient/family education    PT Goals (Current goals can be found in the Care Plan section)  Acute Rehab PT Goals Patient Stated Goal: "get my breathing under control" PT Goal Formulation: With patient Time For Goal Achievement: 10/09/19 Potential to Achieve Goals: Fair    Frequency Min 2X/week   Barriers to discharge        Co-evaluation               AM-PAC PT "6 Clicks" Mobility  Outcome Measure Help needed turning from your back to your side while in a flat bed without using bedrails?: A Little Help needed moving from lying on your back to sitting on the side of a flat bed without using bedrails?: A Little Help needed moving to and from a bed to a chair (including a wheelchair)?: A Little Help needed standing up from a chair using your arms (e.g., wheelchair or bedside chair)?: A Little Help needed to walk in hospital room?: A Little Help needed climbing 3-5 steps with a railing? : A Lot 6 Click Score: 17    End of Session Equipment Utilized During Treatment: Gait belt;Oxygen Activity Tolerance: Other (comment)(Limited d/t SOB with activity) Patient left: in chair;with call  bell/phone within reach;with chair alarm set Nurse Communication: Mobility status;Precautions PT Visit Diagnosis: Other abnormalities of gait and mobility (R26.89);Muscle weakness (generalized) (M62.81);Difficulty in walking, not elsewhere classified (R26.2)    Time: 1422-1450 PT Time Calculation (min) (ACUTE ONLY): 28 min   Charges:   PT Evaluation $PT Eval Low Complexity: 1 Low PT Treatments $Therapeutic Exercise: 8-22 mins        Leitha Bleak, PT 09/25/19, 3:24 PM

## 2019-09-25 NOTE — Progress Notes (Signed)
Progress Note    Natalie Thomas  [id number] DOB: [birth year]  DOA: 09/22/2019 PCP: System, Pcp Not In      Brief Narrative:    Medical records reviewed and are as summarized below:  Natalie Thomas is an 59 y.o. female with medical history significant of recent diagnosis of systolic dysfunction CHF, chronic hyponatremia, anxiety disorder, schizoaffective disorder, anemia, hyperlipidemia, hypertension and diabetes who was discharged from the hospital on March 24 to skilled nursing facility after admission for 3 days with acute respiratory failure secondary to hypoxemia from CHF and COPD.  During that admission she had echocardiogram showing EF of 40 to 45%.  Patient discharged on oxygen at 2 L/min.  She was seen by cardiology and home dose Lasix was increased from 20 mg to 40 mg.  Aldactone added at 25 mg.  Patient still to follow-up with cardiology.    She was brought back to to the hospital because of worsening shortness of breath cough and hypoxemia.  She was hypoxemic, requiring 5 L/min oxygen via nasal cannula. Work-up in the ER showed bilateral pleural effusion as well as pulmonary edema.  She was admitted to the hospital for acute exacerbation of chronic systolic CHF and bilateral pleural effusions.  She was treated with IV Lasix.  She underwent left-sided thoracentesis and to 250 cc of transudative fluid was removed.     Assessment/Plan:   Principal Problem:   Acute on chronic combined systolic and diastolic CHF (congestive heart failure) (HCC) Active Problems:   DM (diabetes mellitus), type 2 (HCC)   Schizoaffective disorder (HCC)   Essential hypertension   COPD (chronic obstructive pulmonary disease) (HCC)   GERD   Hyperlipidemia   Acute on chronic respiratory failure with hypoxia and hypercapnia (HCC)   Pleural effusion   Acute on chronic respiratory failure with hypoxemia (HCC)   Shortness of breath    acute on chronic respiratory failure with hypoxia  and hypercarbia/acute exacerbation of chronic systolic CHF/bilateral pleural effusions:  S/p left-sided thoracentesis on 09/23/2019 and only 250 mls of transudative fluid was removed.  Pleural fluid culture is pending.  Pleural fluid cytology was negative for malignancy.  Change IV Lasix to oral Lasix.  Monitor BMP, daily weights and urine output.  diabetes: Sliding scale insulin and home regimen  essential hypertension: Continue antihypertensives  schizoaffective disorder: Continue psychotropics  COPD:  Stable.  Continue bronchodilators  GERD: Continue PPI  hyperlipidemia: Continue with statin  Mild hypoglycemia: Improved.  Continue to monitor.  Body mass index is 41.8 kg/m.  (morbid obesity)   Family Communication/Anticipated D/C date and plan/Code Status   DVT prophylaxis: Subcu heparin Code Status: Full code Family Communication: Plan discussed with patient Disposition Plan: Patient is from group home.  Plan to discharge her to SNF tomorrow.  Awaiting placement.    Subjective:   Breathing is a little better today.  Objective:    Vitals:   09/25/19 0346 09/25/19 0500 09/25/19 0816 09/25/19 1205  BP: 116/67  128/85 123/73  Pulse: 72  71 72  Resp: 19  (!) 22 19  Temp: 98.6 F (37 C)  98 F (36.7 C) (!) 97.5 F (36.4 C)  TempSrc: Oral  Oral Oral  SpO2: 96%  98% 100%  Weight:  100.3 kg    Height:        Intake/Output Summary (Last 24 hours) at 09/25/2019 1611 Last data filed at 09/25/2019 1330 Gross per 24 hour  Intake 483 ml  Output 5100 ml  Net -4617 ml   Filed Weights   09/23/19 0451 09/24/19 0434 09/25/19 0500  Weight: 102.2 kg 101.2 kg 100.3 kg    Exam:  GEN: NAD SKIN: Multiple bruises on lower abdomen EYES: EOMI ENT: MMM CV: RRR PULM: Improved air entry bilaterally.  No wheezing or rales heard. ABD: soft, obese, NT, +BS CNS: AAO x 3, non focal EXT: b/l leg edema improved but left foot is still swollen, no tenderness   Data Reviewed:    I have personally reviewed following labs and imaging studies:  Labs: Labs show the following:   Basic Metabolic Panel: Recent Labs  Lab 09/22/19 1535 09/22/19 1535 09/22/19 2152 09/23/19 0417 09/23/19 0417 09/24/19 0454 09/25/19 0457  NA 129*  --   --  134*  --  133* 131*  K 4.9   < >  --  4.9   < > 4.4 4.8  CL 85*  --   --  88*  --  84* 80*  CO2 34*  --   --  38*  --  39* 42*  GLUCOSE 138*  --   --  151*  --  99 104*  BUN 18  --   --  21*  --  22* 23*  CREATININE 0.71  --  0.70 0.64  --  0.54 0.63  CALCIUM 8.9  --   --  8.4*  --  8.6* 8.8*   < > = values in this interval not displayed.   GFR Estimated Creatinine Clearance: 84.3 mL/min (by C-G formula based on SCr of 0.63 mg/dL). Liver Function Tests: Recent Labs  Lab 09/23/19 0923  AST 14*  ALT 11  ALKPHOS 51  BILITOT 0.5  PROT 6.6  ALBUMIN 3.1*   No results for input(s): LIPASE, AMYLASE in the last 168 hours. No results for input(s): AMMONIA in the last 168 hours. Coagulation profile No results for input(s): INR, PROTIME in the last 168 hours.  CBC: Recent Labs  Lab 09/22/19 1535 09/22/19 2152 09/23/19 0417  WBC 11.8* 7.3 5.5  NEUTROABS  --   --  4.8  HGB 12.6 11.8* 11.0*  HCT 39.3 36.5 34.3*  MCV 92.0 92.4 92.2  PLT 154 130* 135*   Cardiac Enzymes: No results for input(s): CKTOTAL, CKMB, CKMBINDEX, TROPONINI in the last 168 hours. BNP (last 3 results) No results for input(s): PROBNP in the last 8760 hours. CBG: Recent Labs  Lab 09/24/19 1132 09/24/19 1641 09/24/19 2119 09/25/19 0809 09/25/19 1201  GLUCAP 100* 162* 133* 88 103*   D-Dimer: No results for input(s): DDIMER in the last 72 hours. Hgb A1c: No results for input(s): HGBA1C in the last 72 hours. Lipid Profile: No results for input(s): CHOL, HDL, LDLCALC, TRIG, CHOLHDL, LDLDIRECT in the last 72 hours. Thyroid function studies: No results for input(s): TSH, T4TOTAL, T3FREE, THYROIDAB in the last 72 hours.  Invalid input(s):  FREET3 Anemia work up: No results for input(s): VITAMINB12, FOLATE, FERRITIN, TIBC, IRON, RETICCTPCT in the last 72 hours. Sepsis Labs: Recent Labs  Lab 09/22/19 1535 09/22/19 1751 09/22/19 1959 09/22/19 2152 09/23/19 0417  PROCALCITON <0.10  --   --   --   --   WBC 11.8*  --   --  7.3 5.5  LATICACIDVEN  --  1.2 1.1  --   --     Microbiology Recent Results (from the past 240 hour(s))  Blood culture (routine x 2)     Status: None (Preliminary result)   Collection Time: 09/22/19  3:35  PM   Specimen: BLOOD  Result Value Ref Range Status   Specimen Description BLOOD BLOOD RIGHT WRIST  Final   Special Requests   Final    BOTTLES DRAWN AEROBIC AND ANAEROBIC Blood Culture adequate volume   Culture   Final    NO GROWTH 3 DAYS Performed at [facility], [address] [place], [place] [address]    Report Status PENDING  Incomplete  Blood culture (routine x 2)     Status: None (Preliminary result)   Collection Time: 09/22/19  3:36 PM   Specimen: BLOOD  Result Value Ref Range Status   Specimen Description BLOOD BLOOD LEFT WRIST  Final   Special Requests   Final    BOTTLES DRAWN AEROBIC AND ANAEROBIC Blood Culture adequate volume   Culture   Final    NO GROWTH 3 DAYS Performed at [facility], [address], [place], [place] [address]    Report Status PENDING  Incomplete  Respiratory Panel by RT PCR (Flu A&B, Covid) - Nasopharyngeal Swab     Status: None   Collection Time: 09/22/19  7:59 PM   Specimen: Nasopharyngeal Swab  Result Value Ref Range Status   SARS Coronavirus 2 by RT PCR NEGATIVE NEGATIVE Final    Comment: (NOTE) SARS-CoV-2 target nucleic acids are NOT DETECTED. The SARS-CoV-2 RNA is generally detectable in upper respiratoy specimens during the acute phase of infection. The lowest concentration of SARS-CoV-2 viral copies this assay can detect is 131 copies/mL. A negative result does not preclude SARS-Cov-2 infection and should not be used as  the sole basis for treatment or other patient management decisions. A negative result may occur with  improper specimen collection/handling, submission of specimen other than nasopharyngeal swab, presence of viral mutation(s) within the areas targeted by this assay, and inadequate number of viral copies (<131 copies/mL). A negative result must be combined with clinical observations, patient history, and epidemiological information. The expected result is Negative. Fact Sheet for Patients:  PinkCheek.be Fact Sheet for Healthcare Providers:  GravelBags.it This test is not yet ap proved or cleared by the [place] FDA and  has been authorized for detection and/or diagnosis of SARS-CoV-2 by FDA under an Emergency Use Authorization (EUA). This EUA will remain  in effect (meaning this test can be used) for the duration of the COVID-19 declaration under Section 564(b)(1) of the Act, 21 U.S.C. section 360bbb-3(b)(1), unless the authorization is terminated or revoked sooner.    Influenza A by PCR NEGATIVE NEGATIVE Final   Influenza B by PCR NEGATIVE NEGATIVE Final    Comment: (NOTE) The Xpert Xpress SARS-CoV-2/FLU/RSV assay is intended as an aid in  the diagnosis of influenza from Nasopharyngeal swab specimens and  should not be used as a sole basis for treatment. Nasal washings and  aspirates are unacceptable for Xpert Xpress SARS-CoV-2/FLU/RSV  testing. Fact Sheet for Patients: PinkCheek.be Fact Sheet for Healthcare Providers: GravelBags.it This test is not yet approved or cleared by the [place] FDA and  has been authorized for detection and/or diagnosis of SARS-CoV-2 by  FDA under an Emergency Use Authorization (EUA). This EUA will remain  in effect (meaning this test can be used) for the duration of the  Covid-19 declaration under Section 564(b)(1) of the Act, 21    U.S.C. section 360bbb-3(b)(1), unless the authorization is  terminated or revoked. Performed at [facility], [address] [place], [place] [address]   Body fluid culture     Status: None (Preliminary  result)   Collection Time: 09/23/19  2:04 PM   Specimen: PATH Cytology Pleural fluid  Result Value Ref Range Status   Specimen Description   Final    PLEURAL Performed at [facility], [address] [place], [place] [address]    Special Requests   Final    NONE Performed at [facility], [address] [place], [place] [address]    Gram Stain   Final    MODERATE WBC PRESENT, PREDOMINANTLY MONONUCLEAR NO ORGANISMS SEEN    Culture   Final    NO GROWTH 2 DAYS Performed at [facility], [place] [address], [place], [place] [address]    Report Status PENDING  Incomplete    Procedures and diagnostic studies:  No results found.  Medications:   . ascorbic acid  1,000 mg Oral Daily  . buPROPion  150 mg Oral Daily  . calcium carbonate  1 tablet Oral TID WC  . calcium-vitamin D  1 tablet Oral Daily  . clonazePAM  1 mg Oral BID  . docusate sodium  100 mg Oral BID  . feeding supplement (ENSURE ENLIVE)  237 mL Oral Q24H  . fludrocortisone  0.1 mg Oral Daily  . fluticasone  2 spray Each Nare BID  . fluticasone furoate-vilanterol  1 puff Inhalation Daily  . furosemide  40 mg Oral BID  . guaiFENesin  1,200 mg Oral Daily  . heparin  5,000 Units Subcutaneous Q8H  . insulin aspart  0-5 Units Subcutaneous QHS  . loratadine  10 mg Oral Daily  . lurasidone  20 mg Oral BID  . melatonin  5 mg Oral QHS  . multivitamin with minerals  1 tablet Oral Daily  . nicotine  21 mg Transdermal Daily  . NIFEdipine  90 mg Oral Daily  . OLANZapine  10 mg Oral BID  . pantoprazole  40 mg Oral Daily  . polyethylene glycol  17 g Oral Daily  . sodium chloride flush  3 mL Intravenous Q12H  . spironolactone  25 mg Oral Daily  . sucralfate  1 g Oral TID WC & HS  .  valproic acid  500 mg Oral TID  . zinc sulfate  220 mg Oral Daily   Continuous Infusions: . sodium chloride       LOS: 3 days   Calin Ellery  [facility]     09/25/2019, 4:11 PM

## 2019-09-25 NOTE — Evaluation (Signed)
Occupational Therapy Evaluation Patient Details Name: CLYDIA DURFEY MRN: [id number] DOB: 05/21/1961 Today's Date: 09/25/2019    History of Present Illness Mcneary is an 59 y.o. female with medical history significant of recent diagnosis of systolic dysfunction CHF, chronic hyponatremia, anxiety disorder, schizoaffective disorder, anemia, hyperlipidemia, hypertension and diabetes who was discharged from the hospital on March 24 to skilled nursing facility after admission for 3 days with acute respiratory failure secondary to hypoxemia from CHF and COPD. She was admitted to the hospital for acute exacerbation of chronic systolic CHF and bilateral pleural effusions on 3/30. Underwent thoracentesis 3/31.   Clinical Impression   Patient supine in bed upon entry and agreeable to therapy.  Patient with no complaints of pain, only itching under bandage at thoracentesis site.  Notified nurse.  Unable to identify specifics of PLOF; patient states "I needed help for everything".  Vitals at 199/61, 74 HR and 98% 02 on 4.5L of supplemental 02.  Patient endorses no dizziness and states "yeah it's usually high" when questioned about BP.  02 remained >88% for duration of session during therapeutic activities.  Patient requesting bedpan initially as purwick not working correctly.  Patient performed bed mobility with SPV.  CGA for SPT to recliner using RW with cues on safety regarding 02 cord.  Educated patient that she is able to use [facility] with assistance; patient agreed and verbalized understanding.  Discussed use of AE to improve independence with self care.  Patient declined interest stating "it's harder to use than it looks".  Patient would benefit from continued occupational therapy services to address activity tolerance, compensatory techniques, energy conservation techniques and overall strength/endurance as relates to occupational tasks. Patient plans to return to previous SNF for rehabilitation.      Follow Up  Recommendations  SNF;Supervision - Intermittent(Patient plans to return to [place])    Equipment Recommendations  Other (comment)(defer to next level of care)    Recommendations for Other Services       Precautions / Restrictions Precautions Precautions: Fall;Other (comment)(MRSA) Restrictions Weight Bearing Restrictions: No      Mobility Bed Mobility Overal bed mobility: Needs Assistance Bed Mobility: Rolling;Supine to Sit;Sit to Supine Rolling: Supervision   Supine to sit: Supervision;HOB elevated Sit to supine: Supervision   General bed mobility comments: Patient aware of 02 cord but required minimal cues for safety  Transfers Overall transfer level: Needs assistance Equipment used: Rolling walker (2 wheeled) Transfers: Sit to/from [facility] Sit to Stand: Min guard Stand pivot transfers: Min guard       General transfer comment: Demonstrates good use of body mechanics and sequencing    Balance                                           ADL either performed or assessed with clinical judgement   ADL Overall ADL's : Needs assistance/impaired                         Toilet Transfer: Min guard;RW           Functional mobility during ADLs: Min guard;Rolling walker General ADL Comments: Patient requires assistance for all self care tasks at baseline.  Patient states she tried to do as much of her dressing as possible. Reluctant to utilize reacher.     Vision Patient Visual Report: No change from  baseline       Perception     Praxis      Pertinent Vitals/Pain Pain Assessment: No/denies pain(Only reports some itching under bandage on R trunk.)     Hand Dominance Right   Extremity/Trunk Assessment Upper Extremity Assessment Upper Extremity Assessment: Overall WFL for tasks assessed   Lower Extremity Assessment Lower Extremity Assessment: Defer to PT evaluation        Communication Communication Communication: No difficulties   Cognition Arousal/Alertness: Awake/alert Behavior During Therapy: WFL for tasks assessed/performed Overall Cognitive Status: No family/caregiver present to determine baseline cognitive functioning                                 General Comments: Patient with schizoaffective disorder.  Noted patient with mild confusion regarding room, asking "what's that?" (point to lights, IV, etc), "why is it so quiet?"  However patient is aware of situation, self and place.   General Comments  Patient requires cues to breath through nose during functional tasks instead of mouth.  Educated further on PLB.    Exercises Other Exercises Other Exercises: Provided education on goals and role of OT in acute care setting Other Exercises: Provided education on body mechanics, sequencing and safety during functional transfers Other Exercises: Provided education on use of PLB and EC techniques to improve safety during ADLs   Shoulder Instructions      Home Living Family/patient expects to be discharged to:: Skilled nursing facility                                 Additional Comments: [facility]      Prior Functioning/Environment Level of Independence: Needs assistance  Gait / Transfers Assistance Needed: Patient states she was able to use 2WW for functional transfers with SPV/SBA. ADL's / Homemaking Assistance Needed: Unable to report how independent she was, but states "I need help with all my care".            OT Problem List: Decreased activity tolerance;Impaired balance (sitting and/or standing);Decreased knowledge of precautions      OT Treatment/Interventions: Self-care/ADL training;Therapeutic exercise;Energy conservation;DME and/or AE instruction;Therapeutic activities;Patient/family education    OT Goals(Current goals can be found in the care plan section) Acute Rehab OT  Goals Patient Stated Goal: "get my breathing under control" OT Goal Formulation: With patient Time For Goal Achievement: 10/09/19 Potential to Achieve Goals: Good  OT Frequency: Min 1X/week   Barriers to D/C:            Co-evaluation              AM-PAC OT "6 Clicks" Daily Activity     Outcome Measure Help from another person eating meals?: None Help from another person taking care of personal grooming?: None Help from another person toileting, which includes using toliet, bedpan, or urinal?: A Little Help from another person bathing (including washing, rinsing, drying)?: A Lot Help from another person to put on and taking off regular upper body clothing?: None Help from another person to put on and taking off regular lower body clothing?: A Lot 6 Click Score: 19   End of Session Equipment Utilized During Treatment: Gait belt;Rolling walker Nurse Communication: Other (comment)(Discussed patient is in reliner, itching at surgical site and purwick not working.)  Activity Tolerance: Patient tolerated treatment well Patient left: in chair;with call bell/phone within  reach;with chair alarm set  OT Visit Diagnosis: Other (comment);Unsteadiness on feet (R26.81)(acute on chronic CHF exacerbation)                Time: [id number] OT Time Calculation (min): 57 min Charges:  OT General Charges $OT Visit: 1 Visit OT Evaluation $OT Eval Moderate Complexity: 1 Mod OT Treatments $Self Care/Home Management : 8-22 mins $Therapeutic Activity: 23-37 mins  Baldomero Lamy, MS, OTR/L 09/25/19, 2:38 PM

## 2019-09-25 NOTE — Care Management Important Message (Signed)
Important Message  Patient Details  Name: Natalie Thomas MRN: [id number] Date of Birth: 1960/12/18   Medicare Important Message Given:  Yes  Copy given to patient.  Called and spoke with legal guardian to make aware of Medicare IM.  Copy of information sent securely to email address provided: [email or web address].     Dannette Barbara 09/25/2019, 12:33 PM

## 2019-09-25 NOTE — Plan of Care (Signed)
Nutrition Education Note  RD consulted for nutrition education regarding CHF.  59 y.o. female with medical history significant of recent diagnosis of systolic dysfunction CHF, chronic hyponatremia, anxiety disorder, schizoaffective disorder, anemia, hyperlipidemia, hypertension and diabetes who was discharged from the hospital on March 24 to skilled nursing facility after admission for 3 days with acute respiratory failure secondary to hypoxemia from CHF and COPD.  RD attempted to provide pt with low sodium diet education. Pt appears to have some level of a learning barrier. Pt upset and confused of why we are telling her to eat less salt but her sodium levels in the hospital are low; pt is unable to understand explanation of why this is. Pt reports that she lives in a group home and has little choice over the food she is served. Pt is able to get snacks of to which she reports eating a lot of chips and pork rinds. Pt also reports eating a lot of soups. RD tried to focus education on choices that pt did have control over such as eliminating unhealthy snacks but pt is upset at everything she is told to avoid. Pt repeatedly saying that she is "being tricked". Pt is not really appropriate for education at this time.  RD did provide "Low Sodium Nutrition Therapy" handout from the Academy of [employer] and Dietetics.  Pt with poor dentition; requesting chopped foods   RD did discuss why it is important for patient to adhere to diet recommendations, and emphasized the role of fluids, foods to avoid, and importance of weighing self daily. Teach back method used.  Expect poor compliance.  Body mass index is 41.8 kg/m. Pt meets criteria for obesity based on current BMI.  Current diet order is HH, patient is consuming approximately 90-100% of meals at this time. Labs and medications reviewed. No further nutrition interventions warranted at this time. RD contact information provided. If additional nutrition  issues arise, please re-consult RD.   Natalie Distance MS, RD, LDN Please refer to [facility] for RD and/or RD on-call/weekend/after hours pager

## 2019-09-26 LAB — BASIC METABOLIC PANEL
Anion gap: 9 (ref 5–15)
BUN: 23 mg/dL — ABNORMAL HIGH (ref 6–20)
CO2: 42 mmol/L — ABNORMAL HIGH (ref 22–32)
Calcium: 9.2 mg/dL (ref 8.9–10.3)
Chloride: 79 mmol/L — ABNORMAL LOW (ref 98–111)
Creatinine, Ser: 0.78 mg/dL (ref 0.44–1.00)
GFR calc Af Amer: >60 mL/min (ref >60)
GFR calc non Af Amer: >60 mL/min (ref >60)
Glucose, Bld: 144 mg/dL — ABNORMAL HIGH (ref 70–99)
Potassium: 5.2 mmol/L — ABNORMAL HIGH (ref 3.5–5.1)
Sodium: 130 mmol/L — ABNORMAL LOW (ref 135–145)

## 2019-09-26 LAB — GLUCOSE, CAPILLARY
Glucose-Capillary: 128 mg/dL — ABNORMAL HIGH (ref 70–99)
Glucose-Capillary: 101 mg/dL — ABNORMAL HIGH (ref 70–99)
Glucose-Capillary: 112 mg/dL — ABNORMAL HIGH (ref 70–99)

## 2019-09-26 MED ORDER — SPIRONOLACTONE 25 MG PO TABS: 25.0000 mg | ORAL_TABLET | Freq: Every day | ORAL | Status: DC

## 2019-09-26 NOTE — Plan of Care (Signed)
  Problem: Education: Goal: Knowledge of General Education information will improve Description: Including pain rating scale, medication(s)/side effects and non-pharmacologic comfort measures Outcome: Progressing   Problem: Clinical Measurements: Goal: Respiratory complications will improve Outcome: Progressing   Problem: Safety: Goal: Ability to remain free from injury will improve Outcome: Progressing   Problem: Education: Goal: Ability to verbalize understanding of medication therapies will improve Outcome: Progressing

## 2019-09-26 NOTE — TOC Progression Note (Signed)
Transition of Care [facility]) - Discharge Note    Patient Details  Name: Natalie Thomas MRN: [id number] Date of Birth: 10-29-1960  [facility] of [facility]) CM/SW Contact  Mikya Don, [place], [name] Phone Number: 09/26/2019, 1:49 PM  Clinical Narrative:    Patient to discharge back to [facility] today. Claiborne Billings, admissions coordinator notified of patient's return. Discharge summary faxed to [facility]. Patient to return to room 74A by EMS transport.  Dadrian Ballantine, LCSW Clinical Social Work [phone]    Expected Discharge Plan: [place] Barriers to Discharge: Continued Medical Work up  Expected Discharge Plan and Services Expected Discharge Plan: [place]   Discharge Planning Services: CM Consult Post Acute Care Choice: [place] Living arrangements for the past 2 months: [place] Expected Discharge Date: 09/26/19                                     Social Determinants of Health (SDOH) Interventions    Readmission Risk Interventions Readmission Risk Prevention Plan 09/15/2019 09/08/2019 07/10/2019  Transportation Screening Complete Complete Complete  Medication Review Press photographer) Complete Complete Referral to Pharmacy  PCP or Specialist appointment within 3-5 days of discharge Complete Complete Complete  HRI or Home Care Consult Complete Complete Not Complete  HRI or Home Care Consult Pt Refusal Comments - - pt is resident of [facility]  SW Recovery Care/Counseling Consult - Complete Complete  Palliative Care Screening - Not Applicable Not [place] Complete Complete Not Applicable  Some recent data might be hidden

## 2019-09-26 NOTE — Discharge Summary (Addendum)
Physician Discharge Summary  Natalie Thomas [id number] DOB: 08/03/1960 DOA: 09/22/2019  PCP: System, Pcp Not In  Admit date: 09/22/2019 Discharge date: 09/26/2019  Discharge disposition: Skilled nursing facility   Recommendations for Outpatient Follow-Up:   Follow-up with physician at the nursing home within 3 days of discharge   Discharge Diagnosis:   Principal Problem:   Acute on chronic combined systolic and diastolic CHF (congestive heart failure) (HCC) Active Problems:   DM (diabetes mellitus), type 2 ([place])   Schizoaffective disorder ([place])   Essential hypertension   COPD (chronic obstructive pulmonary disease) ([place])   GERD   Hyperlipidemia   Acute on chronic respiratory failure with hypoxia and hypercapnia (HCC)   Pleural effusion   Acute on chronic respiratory failure with hypoxemia (HCC)   Shortness of breath    Discharge Condition: Stable.  Diet recommendation: Low-salt diet  Code status: Full code.    Hospital Course:   Natalie Thomas is an 59 y.o. female with medical history significant ofrecent diagnosis of systolic dysfunction CHF, chronic hyponatremia, anxiety disorder, schizoaffective disorder, anemia, hyperlipidemia, hypertension and diabetes who was discharged from the hospital on March 24 to skilled nursing facility after admission for 3 days with acute respiratory failure secondary to hypoxemia from CHF and COPD. During that admission she had echocardiogram showing EF of 40 to 45%. Patient discharged on oxygen at 2 L/min. She was seen by cardiology and home dose Lasix was increased from 20 mg to 40 mg. Aldactone added at 25 mg.   She was brought back to to the hospital because of worsening shortness of breath cough and hypoxemia. She was hypoxemic, requiring 5 L/min oxygen via nasal cannula.Work-up in the ER showed bilateral pleural effusion as well as pulmonary edema.  She was admitted to the hospital for acute exacerbation of  chronic systolic CHF and bilateral pleural effusions.  She was treated with IV Lasix.  She underwent left-sided thoracentesis and 250 cc of transudative fluid was removed.  Pleural fluid cytology was negative for malignancy and pleural fluid culture did not show any growth.  Her condition has improved and she is deemed stable for discharge to the nursing home today.     Discharge Exam:   Vitals:   09/26/19 0609 09/26/19 0809  BP:  (!) 143/90  Pulse:  97  Resp:  16  Temp:  98.2 F (36.8 C)  SpO2: 93% 98%   Vitals:   09/25/19 2025 09/26/19 0524 09/26/19 0609 09/26/19 0809  BP:  123/81  (!) 143/90  Pulse:  91  97  Resp:    16  Temp:  98.6 F (37 C)  98.2 F (36.8 C)  TempSrc:  Oral  Oral  SpO2: 99% 91% 93% 98%  Weight:  99.2 kg    Height:         GEN: NAD SKIN: No rash EYES: EOMI ENT: MMM CV: RRR PULM: CTA B ABD: soft, ND, NT, +BS CNS: AAO x 3, non focal EXT: No edema or tenderness   The results of significant diagnostics from this hospitalization (including imaging, microbiology, ancillary and laboratory) are listed below for reference.     Procedures and Diagnostic Studies:   DG Chest Port 1 View  Result Date: 09/23/2019 CLINICAL DATA:  Provided history: Pleural effusion on right. Status post thoracentesis. EXAM: PORTABLE CHEST 1 VIEW COMPARISON:  Chest radiograph 09/22/2019 FINDINGS: Unchanged cardiomegaly. Aortic atherosclerosis. Similar appearance of pulmonary vascular congestion with interstitial edema. No significant residual right pleural effusion is evident.  Probable persistent small left pleural effusion with left basilar atelectasis. No evidence of pneumothorax. No acute bony abnormality. IMPRESSION: Status post reported right thoracentesis. No residual right pleural effusion is evident. No evidence of pneumothorax. Unchanged cardiomegaly with pulmonary vascular congestion and interstitial edema. Persistent small left pleural effusion with left basilar  atelectasis. Electronically Signed   By: Kellie Simmering DO   On: 09/23/2019 14:21   DG Chest Portable 1 View  Result Date: 09/22/2019 CLINICAL DATA:  Difficulty breathing, recent COVID pneumonia EXAM: PORTABLE CHEST 1 VIEW COMPARISON:  09/13/2019 FINDINGS: Single frontal view of the chest demonstrates persistent enlargement the cardiac silhouette. Chronic bilateral vascular congestion with scattered ground-glass airspace disease and bilateral effusions. No pneumothorax. IMPRESSION: 1. Findings consistent with congestive heart failure. Electronically Signed   By: Randa Ngo M.D.   On: 09/22/2019 16:01   US THORACENTESIS ASP PLEURAL SPACE W/IMG GUIDE  Result Date: 09/23/2019 INDICATION: Acute on chronic respiratory failure. Small amount of pleural fluid on chest radiography. EXAM: ULTRASOUND GUIDED RIGHT THORACENTESIS MEDICATIONS: None. COMPLICATIONS: None immediate. PROCEDURE: An ultrasound guided thoracentesis was thoroughly discussed with the patient and questions answered. The benefits, risks, alternatives and complications were also discussed. The patient understands and wishes to proceed with the procedure. Written consent was obtained. Ultrasound was performed to localize and mark an adequate pocket of fluid in the right chest. The area was then prepped and draped in the normal sterile fashion. 1% Lidocaine was used for local anesthesia. Under ultrasound guidance a 6 Fr Safe-T-Centesis catheter was introduced. Thoracentesis was performed. The catheter was removed and a dressing applied. FINDINGS: A total of approximately 250 of amber fluid was removed. Samples were sent to the laboratory as requested by the clinical team. No significant left pleural fluid identified with ultrasound. Follow-up chest radiograph was negative for a pneumothorax. IMPRESSION: Successful ultrasound guided right thoracentesis yielding 250 mL of pleural fluid. Electronically Signed   By: Markus Daft M.D.   On: 09/23/2019 16:22       Labs:   Basic Metabolic Panel: Recent Labs  Lab 09/22/19 1535 09/22/19 1535 09/22/19 2152 09/23/19 0417 09/23/19 0417 09/24/19 0454 09/24/19 0454 09/25/19 0457 09/26/19 0443  NA 129*  --   --  134*  --  133*  --  131* 130*  K 4.9   < >  --  4.9   < > 4.4   < > 4.8 5.2*  CL 85*  --   --  88*  --  84*  --  80* 79*  CO2 34*  --   --  38*  --  39*  --  42* 42*  GLUCOSE 138*  --   --  151*  --  99  --  104* 144*  BUN 18  --   --  21*  --  22*  --  23* 23*  CREATININE 0.71   < > 0.70 0.64  --  0.54  --  0.63 0.78  CALCIUM 8.9  --   --  8.4*  --  8.6*  --  8.8* 9.2   < > = values in this interval not displayed.   GFR Estimated Creatinine Clearance: 83.8 mL/min (by C-G formula based on SCr of 0.78 mg/dL). Liver Function Tests: Recent Labs  Lab 09/23/19 0923  AST 14*  ALT 11  ALKPHOS 51  BILITOT 0.5  PROT 6.6  ALBUMIN 3.1*   No results for input(s): LIPASE, AMYLASE in the last 168 hours. No results for input(s): AMMONIA  in the last 168 hours. Coagulation profile No results for input(s): INR, PROTIME in the last 168 hours.  CBC: Recent Labs  Lab 09/22/19 1535 09/22/19 2152 09/23/19 0417  WBC 11.8* 7.3 5.5  NEUTROABS  --   --  4.8  HGB 12.6 11.8* 11.0*  HCT 39.3 36.5 34.3*  MCV 92.0 92.4 92.2  PLT 154 130* 135*   Cardiac Enzymes: No results for input(s): CKTOTAL, CKMB, CKMBINDEX, TROPONINI in the last 168 hours. BNP: Invalid input(s): POCBNP CBG: Recent Labs  Lab 09/25/19 0809 09/25/19 1201 09/25/19 1651 09/25/19 2108 09/26/19 0811  GLUCAP 88 103* 94 130* 128*   D-Dimer No results for input(s): DDIMER in the last 72 hours. Hgb A1c No results for input(s): HGBA1C in the last 72 hours. Lipid Profile No results for input(s): CHOL, HDL, LDLCALC, TRIG, CHOLHDL, LDLDIRECT in the last 72 hours. Thyroid function studies No results for input(s): TSH, T4TOTAL, T3FREE, THYROIDAB in the last 72 hours.  Invalid input(s): FREET3 Anemia work up No results  for input(s): VITAMINB12, FOLATE, FERRITIN, TIBC, IRON, RETICCTPCT in the last 72 hours. Microbiology Recent Results (from the past 240 hour(s))  Blood culture (routine x 2)     Status: None (Preliminary result)   Collection Time: 09/22/19  3:35 PM   Specimen: BLOOD  Result Value Ref Range Status   Specimen Description BLOOD BLOOD RIGHT WRIST  Final   Special Requests   Final    BOTTLES DRAWN AEROBIC AND ANAEROBIC Blood Culture adequate volume   Culture   Final    NO GROWTH 4 DAYS Performed at [facility], [address], [place], [place] [address]    Report Status PENDING  Incomplete  Blood culture (routine x 2)     Status: None (Preliminary result)   Collection Time: 09/22/19  3:36 PM   Specimen: BLOOD  Result Value Ref Range Status   Specimen Description BLOOD BLOOD LEFT WRIST  Final   Special Requests   Final    BOTTLES DRAWN AEROBIC AND ANAEROBIC Blood Culture adequate volume   Culture   Final    NO GROWTH 4 DAYS Performed at [facility], [address] [place], [place] [address]    Report Status PENDING  Incomplete  Respiratory Panel by RT PCR (Flu A&B, Covid) - Nasopharyngeal Swab     Status: None   Collection Time: 09/22/19  7:59 PM   Specimen: Nasopharyngeal Swab  Result Value Ref Range Status   SARS Coronavirus 2 by RT PCR NEGATIVE NEGATIVE Final    Comment: (NOTE) SARS-CoV-2 target nucleic acids are NOT DETECTED. The SARS-CoV-2 RNA is generally detectable in upper respiratoy specimens during the acute phase of infection. The lowest concentration of SARS-CoV-2 viral copies this assay can detect is 131 copies/mL. A negative result does not preclude SARS-Cov-2 infection and should not be used as the sole basis for treatment or other patient management decisions. A negative result may occur with  improper specimen collection/handling, submission of specimen other than nasopharyngeal swab, presence of viral mutation(s) within the areas targeted  by this assay, and inadequate number of viral copies (<131 copies/mL). A negative result must be combined with clinical observations, patient history, and epidemiological information. The expected result is Negative. Fact Sheet for Patients:  PinkCheek.be Fact Sheet for Healthcare Providers:  GravelBags.it This test is not yet ap proved or cleared by the [place] FDA and  has been authorized for detection and/or diagnosis of SARS-CoV-2 by FDA under an Emergency Use Authorization (EUA).  This EUA will remain  in effect (meaning this test can be used) for the duration of the COVID-19 declaration under Section 564(b)(1) of the Act, 21 U.S.C. section 360bbb-3(b)(1), unless the authorization is terminated or revoked sooner.    Influenza A by PCR NEGATIVE NEGATIVE Final   Influenza B by PCR NEGATIVE NEGATIVE Final    Comment: (NOTE) The Xpert Xpress SARS-CoV-2/FLU/RSV assay is intended as an aid in  the diagnosis of influenza from Nasopharyngeal swab specimens and  should not be used as a sole basis for treatment. Nasal washings and  aspirates are unacceptable for Xpert Xpress SARS-CoV-2/FLU/RSV  testing. Fact Sheet for Patients: PinkCheek.be Fact Sheet for Healthcare Providers: GravelBags.it This test is not yet approved or cleared by the [place] FDA and  has been authorized for detection and/or diagnosis of SARS-CoV-2 by  FDA under an Emergency Use Authorization (EUA). This EUA will remain  in effect (meaning this test can be used) for the duration of the  Covid-19 declaration under Section 564(b)(1) of the Act, 21  U.S.C. section 360bbb-3(b)(1), unless the authorization is  terminated or revoked. Performed at [facility], [address] [place], [place] [address]   Body fluid culture     Status: None (Preliminary result)   Collection Time:  09/23/19  2:04 PM   Specimen: PATH Cytology Pleural fluid  Result Value Ref Range Status   Specimen Description   Final    PLEURAL Performed at [facility], [address] [place], [place] [address]    Special Requests   Final    NONE Performed at [facility], [address] [place], [place] [address]    Gram Stain   Final    MODERATE WBC PRESENT, PREDOMINANTLY MONONUCLEAR NO ORGANISMS SEEN    Culture   Final    NO GROWTH 3 DAYS Performed at [facility], [place] [address], [place], [place] [address]    Report Status PENDING  Incomplete     Discharge Instructions:   Discharge Instructions    AMB referral to CHF clinic   Complete by: As directed    AMB referral to pulmonary rehabilitation   Complete by: As directed    Please select a program: Respiratory Care Services   Respiratory Care Services Diagnosis: Heart Failure   After initial evaluation and assessments completed: Virtual Based Care may be provided alone or in conjunction with Pulmonary Rehab/Respiratory Care services based on patient barriers.: Yes   Diet - low sodium heart healthy   Complete by: As directed    Discharge instructions   Complete by: As directed    Follow-up with physician at the nursing home within 3 days of discharge.  Monitor basic metabolic panel, especially potassium level, closely.   Increase activity slowly   Complete by: As directed      Allergies as of 09/26/2019      Reactions   Clarithromycin Itching   Penicillins Itching   Has tolerated cefazolin before Has patient had a PCN reaction causing immediate rash, facial/tongue/throat swelling, SOB or lightheadedness with hypotension: No Has patient had a PCN reaction causing severe rash involving mucus membranes or skin necrosis: No Has patient had a PCN reaction that required hospitalization: Unknown Has patient had a PCN reaction occurring within the last 10 years: No If all of the above answers are "NO", then  may proceed with Cephalosporin use.      Medication List    TAKE these medications   acetaminophen 500  MG tablet Commonly known as: TYLENOL Take 1,000 mg by mouth every 8 (eight) hours as needed for mild pain or moderate pain. Not to exceed 3g/24h of tylenol from all sources.   albuterol 108 (90 Base) MCG/ACT inhaler Commonly known as: VENTOLIN HFA Inhale 2 puffs into the lungs every 6 (six) hours as needed for wheezing or shortness of breath.   albuterol (2.5 MG/3ML) 0.083% nebulizer solution Commonly known as: PROVENTIL Take 3 mLs (2.5 mg total) by nebulization every 4 (four) hours as needed for wheezing or shortness of breath.   ANTACID LIQUID PO Take 30 mLs by mouth every 6 (six) hours as needed (heartburn).   ascorbic acid 1000 MG tablet Commonly known as: VITAMIN C Take 1 tablet (1,000 mg total) by mouth daily.   Breo Ellipta 100-25 MCG/INH Aepb Generic drug: fluticasone furoate-vilanterol Inhale 1 puff into the lungs daily.   buPROPion 150 MG 24 hr tablet Commonly known as: WELLBUTRIN XL Take 150 mg by mouth daily.   Calcium 600+D 600-800 MG-UNIT Tabs Generic drug: Calcium Carb-Cholecalciferol Take 1 tablet by mouth daily.   clonazePAM 1 MG tablet Commonly known as: KLONOPIN Take 1 tablet (1 mg total) by mouth 2 (two) times daily.   docusate sodium 100 MG capsule Commonly known as: COLACE Take 100 mg by mouth 2 (two) times daily.   feeding supplement (ENSURE ENLIVE) Liqd Take 237 mLs by mouth daily.   fludrocortisone 0.1 MG tablet Commonly known as: FLORINEF Take 1 tablet (0.1 mg total) by mouth daily.   fluticasone 50 MCG/ACT nasal spray Commonly known as: FLONASE Place 2 sprays into both nostrils 2 (two) times daily.   furosemide 40 MG tablet Commonly known as: LASIX Take 1 tablet (40 mg total) by mouth daily.   guaiFENesin 600 MG 12 hr tablet Commonly known as: MUCINEX Take 1,200 mg by mouth daily.   hydrOXYzine 25 MG tablet Commonly known  as: ATARAX/VISTARIL Take 25 mg by mouth every 6 (six) hours as needed for anxiety or itching.   loperamide 2 MG capsule Commonly known as: IMODIUM Take 2 mg by mouth every 6 (six) hours as needed for diarrhea or loose stools.   loratadine 10 MG tablet Commonly known as: CLARITIN Take 10 mg by mouth daily.   lurasidone 20 MG Tabs tablet Commonly known as: LATUDA Take 1 tablet (20 mg total) by mouth 2 (two) times daily.   melatonin 5 MG Tabs Take 5 mg by mouth at bedtime.   multivitamin with minerals Tabs tablet Take 1 tablet by mouth daily.   neomycin-bacitracin-polymyxin ointment Commonly known as: NEOSPORIN Apply 1 application topically as needed for wound care.   nicotine 21 mg/24hr patch Commonly known as: NICODERM CQ - dosed in mg/24 hours Place 21 mg onto the skin daily.   NIFEdipine 90 MG 24 hr tablet Commonly known as: PROCARDIA XL/NIFEDICAL-XL Take 90 mg by mouth daily.   nystatin powder Generic drug: nystatin Apply 1 application topically 2 (two) times daily as needed (irritation). Apply topically to reddened area twice daily as needed for irritation   OLANZapine 10 MG tablet Commonly known as: ZYPREXA Take 10 mg by mouth 2 (two) times daily.   ondansetron 8 MG tablet Commonly known as: ZOFRAN Take 8 mg by mouth every 6 (six) hours as needed for nausea or vomiting.   pantoprazole 40 MG tablet Commonly known as: Protonix Take 1 tablet (40 mg total) by mouth daily.   polyethylene glycol 17 g packet Commonly known as: MiraLax Take 17  g by mouth daily. What changed: additional instructions   sodium chloride 0.65 % Soln nasal spray Commonly known as: OCEAN Place 2 sprays into both nostrils every 2 (two) hours as needed (dryness, irritation).   spironolactone 25 MG tablet Commonly known as: ALDACTONE Take 1 tablet (25 mg total) by mouth daily. Start taking on: September 28, 2019 What changed: These instructions start on September 28, 2019. If you are unsure what  to do until then, ask your doctor or other care provider.   sucralfate 1 GM/10ML suspension Commonly known as: CARAFATE Take 10 mLs (1 g total) by mouth 4 (four) times daily -  with meals and at bedtime.   valproic acid 250 MG capsule Commonly known as: DEPAKENE Take 2 capsules (500 mg total) by mouth 3 (three) times daily.   zinc sulfate 220 (50 Zn) MG capsule Take 1 capsule (220 mg total) by mouth daily.      Follow-up Information    [facility] Follow up on 10/02/2019.   Specialty: Cardiology Why: at 12:30pm. Enter through the [place] entrance Contact information: [facility] [place] [phone]           Time coordinating discharge: 32 minutes  Signed:  Jennye Boroughs  [facility] 09/26/2019, 12:06 PM

## 2019-09-26 NOTE — Progress Notes (Signed)
Report called to [facility].  Report given to [place]. PIV and cardiac monitoring discontinued. Awaiting transportation via EMS.

## 2019-09-26 NOTE — Discharge Instructions (Signed)

## 2019-09-26 NOTE — Plan of Care (Signed)
Pt transported to [facility] via [place].   Problem: Education: Goal: Knowledge of General Education information will improve Description: Including pain rating scale, medication(s)/side effects and non-pharmacologic comfort measures Outcome: Adequate for Discharge   Problem: Health Behavior/Discharge Planning: Goal: Ability to manage health-related needs will improve Outcome: Adequate for Discharge   Problem: Clinical Measurements: Goal: Ability to maintain clinical measurements within normal limits will improve Outcome: Adequate for Discharge Goal: Will remain free from infection Outcome: Adequate for Discharge Goal: Diagnostic test results will improve Outcome: Adequate for Discharge Goal: Respiratory complications will improve Outcome: Adequate for Discharge Goal: Cardiovascular complication will be avoided Outcome: Adequate for Discharge   Problem: Activity: Goal: Risk for activity intolerance will decrease Outcome: Adequate for Discharge   Problem: Nutrition: Goal: Adequate nutrition will be maintained Outcome: Adequate for Discharge   Problem: Coping: Goal: Level of anxiety will decrease Outcome: Adequate for Discharge   Problem: Elimination: Goal: Will not experience complications related to bowel motility Outcome: Adequate for Discharge Goal: Will not experience complications related to urinary retention Outcome: Adequate for Discharge   Problem: Pain Managment: Goal: General experience of comfort will improve Outcome: Adequate for Discharge   Problem: Safety: Goal: Ability to remain free from injury will improve Outcome: Adequate for Discharge   Problem: Skin Integrity: Goal: Risk for impaired skin integrity will decrease Outcome: Adequate for Discharge   Problem: Education: Goal: Knowledge of General Education information will improve Description: Including pain rating scale, medication(s)/side effects and non-pharmacologic comfort  measures Outcome: Adequate for Discharge   Problem: Health Behavior/Discharge Planning: Goal: Ability to manage health-related needs will improve Outcome: Adequate for Discharge   Problem: Clinical Measurements: Goal: Ability to maintain clinical measurements within normal limits will improve Outcome: Adequate for Discharge Goal: Will remain free from infection Outcome: Adequate for Discharge Goal: Diagnostic test results will improve Outcome: Adequate for Discharge Goal: Respiratory complications will improve Outcome: Adequate for Discharge Goal: Cardiovascular complication will be avoided Outcome: Adequate for Discharge   Problem: Activity: Goal: Risk for activity intolerance will decrease Outcome: Adequate for Discharge   Problem: Nutrition: Goal: Adequate nutrition will be maintained Outcome: Adequate for Discharge   Problem: Coping: Goal: Level of anxiety will decrease Outcome: Adequate for Discharge   Problem: Elimination: Goal: Will not experience complications related to bowel motility Outcome: Adequate for Discharge Goal: Will not experience complications related to urinary retention Outcome: Adequate for Discharge   Problem: Pain Managment: Goal: General experience of comfort will improve Outcome: Adequate for Discharge   Problem: Safety: Goal: Ability to remain free from injury will improve Outcome: Adequate for Discharge   Problem: Skin Integrity: Goal: Risk for impaired skin integrity will decrease Outcome: Adequate for Discharge   Problem: Education: Goal: Ability to demonstrate management of disease process will improve Outcome: Adequate for Discharge Goal: Ability to verbalize understanding of medication therapies will improve Outcome: Adequate for Discharge Goal: Individualized Educational Video(s) Outcome: Adequate for Discharge   Problem: Activity: Goal: Capacity to carry out activities will improve Outcome: Adequate for Discharge    Problem: Cardiac: Goal: Ability to achieve and maintain adequate cardiopulmonary perfusion will improve Outcome: Adequate for Discharge   Problem: Acute Rehab OT Goals (only OT should resolve) Goal: Pt. Will Perform Lower Body Dressing Outcome: Adequate for Discharge Goal: Pt. Will Transfer To Toilet Outcome: Adequate for Discharge Goal: Pt. Will Perform Toileting-Clothing Manipulation Outcome: Adequate for Discharge Goal: OT Additional ADL Goal #1 Outcome: Adequate for Discharge   Problem: Acute Rehab PT Goals(only PT should  resolve) Goal: Pt Will Go Supine/Side To Sit Outcome: Adequate for Discharge Goal: Patient Will Transfer Sit To/From Stand Outcome: Adequate for Discharge Goal: Pt Will Transfer Bed To Chair/Chair To Bed Outcome: Adequate for Discharge Goal: Pt Will Ambulate Outcome: Adequate for Discharge Goal: Pt/caregiver will Perform Home Exercise Program Outcome: Adequate for Discharge   Problem: Food- and Nutrition-Related Knowledge Deficit (NB-1.1) Goal: Nutrition education Description: Formal process to instruct or train a patient/client in a skill or to impart knowledge to help patients/clients voluntarily manage or modify food choices and eating behavior to maintain or improve health. Outcome: Adequate for Discharge

## 2019-09-27 LAB — CULTURE, BLOOD (ROUTINE X 2)
Culture: NO GROWTH
Culture: NO GROWTH
Special Requests: ADEQUATE
Special Requests: ADEQUATE

## 2019-09-27 LAB — BODY FLUID CULTURE: Culture: NO GROWTH

## 2019-09-28 ENCOUNTER — Telehealth: Payer: Self-pay | Admitting: Family

## 2019-09-28 NOTE — Telephone Encounter (Signed)
Spoke with nurse who told me patient is doing well since discharged from hospital to [facility] on 4/3. She is following a low sodium diet, taking her medications without issues, and checking a daily weight. She has no current  Complaints and confirmed her new patient CHF Clinic appointment with [place] for 4/9.     Alyse Low, [place]

## 2019-10-01 ENCOUNTER — Inpatient Hospital Stay
Admission: EM | Admit: 2019-10-01 | Discharge: 2019-10-05 | DRG: 291 | Disposition: A | Discharge status: Skilled Nursing Facility | Payer: Medicare Other | Source: Skilled Nursing Facility | Attending: Internal Medicine | Admitting: Internal Medicine

## 2019-10-01 ENCOUNTER — Emergency Department: Payer: Medicare Other

## 2019-10-01 ENCOUNTER — Other Ambulatory Visit: Payer: Self-pay

## 2019-10-01 ENCOUNTER — Encounter: Payer: Self-pay | Admitting: Emergency Medicine

## 2019-10-01 DIAGNOSIS — Z66 Do not resuscitate: Secondary | ICD-10-CM | POA: Diagnosis not present

## 2019-10-01 DIAGNOSIS — M810 Age-related osteoporosis without current pathological fracture: Secondary | ICD-10-CM | POA: Diagnosis present

## 2019-10-01 DIAGNOSIS — Z515 Encounter for palliative care: Secondary | ICD-10-CM | POA: Diagnosis not present

## 2019-10-01 DIAGNOSIS — K439 Ventral hernia without obstruction or gangrene: Secondary | ICD-10-CM | POA: Diagnosis present

## 2019-10-01 DIAGNOSIS — F259 Schizoaffective disorder, unspecified: Secondary | ICD-10-CM | POA: Diagnosis not present

## 2019-10-01 DIAGNOSIS — E669 Obesity, unspecified: Secondary | ICD-10-CM | POA: Diagnosis not present

## 2019-10-01 DIAGNOSIS — E871 Hypo-osmolality and hyponatremia: Secondary | ICD-10-CM | POA: Diagnosis present

## 2019-10-01 DIAGNOSIS — F419 Anxiety disorder, unspecified: Secondary | ICD-10-CM | POA: Diagnosis present

## 2019-10-01 DIAGNOSIS — R451 Restlessness and agitation: Secondary | ICD-10-CM | POA: Diagnosis not present

## 2019-10-01 DIAGNOSIS — R0602 Shortness of breath: Secondary | ICD-10-CM | POA: Diagnosis present

## 2019-10-01 DIAGNOSIS — F250 Schizoaffective disorder, bipolar type: Secondary | ICD-10-CM | POA: Diagnosis present

## 2019-10-01 DIAGNOSIS — F329 Major depressive disorder, single episode, unspecified: Secondary | ICD-10-CM | POA: Diagnosis present

## 2019-10-01 DIAGNOSIS — I110 Hypertensive heart disease with heart failure: Secondary | ICD-10-CM | POA: Diagnosis present

## 2019-10-01 DIAGNOSIS — K219 Gastro-esophageal reflux disease without esophagitis: Secondary | ICD-10-CM | POA: Diagnosis present

## 2019-10-01 DIAGNOSIS — F05 Delirium due to known physiological condition: Secondary | ICD-10-CM | POA: Diagnosis not present

## 2019-10-01 DIAGNOSIS — J9622 Acute and chronic respiratory failure with hypercapnia: Secondary | ICD-10-CM | POA: Diagnosis not present

## 2019-10-01 DIAGNOSIS — J449 Chronic obstructive pulmonary disease, unspecified: Secondary | ICD-10-CM | POA: Diagnosis present

## 2019-10-01 DIAGNOSIS — Z7189 Other specified counseling: Secondary | ICD-10-CM

## 2019-10-01 DIAGNOSIS — Z20822 Contact with and (suspected) exposure to covid-19: Secondary | ICD-10-CM | POA: Diagnosis present

## 2019-10-01 DIAGNOSIS — Z880 Allergy status to penicillin: Secondary | ICD-10-CM

## 2019-10-01 DIAGNOSIS — Z6839 Body mass index (BMI) 39.0-39.9, adult: Secondary | ICD-10-CM | POA: Diagnosis not present

## 2019-10-01 DIAGNOSIS — Z6841 Body Mass Index (BMI) 40.0 and over, adult: Secondary | ICD-10-CM

## 2019-10-01 DIAGNOSIS — R0902 Hypoxemia: Secondary | ICD-10-CM

## 2019-10-01 DIAGNOSIS — Z9111 Patient's noncompliance with dietary regimen: Secondary | ICD-10-CM | POA: Diagnosis not present

## 2019-10-01 DIAGNOSIS — Z7951 Long term (current) use of inhaled steroids: Secondary | ICD-10-CM

## 2019-10-01 DIAGNOSIS — I5033 Acute on chronic diastolic (congestive) heart failure: Secondary | ICD-10-CM | POA: Diagnosis not present

## 2019-10-01 DIAGNOSIS — Z87891 Personal history of nicotine dependence: Secondary | ICD-10-CM

## 2019-10-01 DIAGNOSIS — J9621 Acute and chronic respiratory failure with hypoxia: Secondary | ICD-10-CM | POA: Diagnosis present

## 2019-10-01 DIAGNOSIS — Z881 Allergy status to other antibiotic agents status: Secondary | ICD-10-CM

## 2019-10-01 DIAGNOSIS — Z8616 Personal history of COVID-19: Secondary | ICD-10-CM

## 2019-10-01 DIAGNOSIS — J9620 Acute and chronic respiratory failure, unspecified whether with hypoxia or hypercapnia: Secondary | ICD-10-CM | POA: Diagnosis present

## 2019-10-01 DIAGNOSIS — E119 Type 2 diabetes mellitus without complications: Secondary | ICD-10-CM | POA: Diagnosis present

## 2019-10-01 DIAGNOSIS — R631 Polydipsia: Secondary | ICD-10-CM | POA: Diagnosis present

## 2019-10-01 DIAGNOSIS — E785 Hyperlipidemia, unspecified: Secondary | ICD-10-CM | POA: Diagnosis present

## 2019-10-01 DIAGNOSIS — Z79899 Other long term (current) drug therapy: Secondary | ICD-10-CM

## 2019-10-01 DIAGNOSIS — I5043 Acute on chronic combined systolic (congestive) and diastolic (congestive) heart failure: Secondary | ICD-10-CM | POA: Diagnosis present

## 2019-10-01 DIAGNOSIS — G4733 Obstructive sleep apnea (adult) (pediatric): Secondary | ICD-10-CM | POA: Diagnosis present

## 2019-10-01 DIAGNOSIS — Z9981 Dependence on supplemental oxygen: Secondary | ICD-10-CM

## 2019-10-01 DIAGNOSIS — Z7952 Long term (current) use of systemic steroids: Secondary | ICD-10-CM

## 2019-10-01 LAB — CBC WITH DIFFERENTIAL/PLATELET
Abs Immature Granulocytes: 0.04 10*3/uL (ref 0.00–0.07)
Basophils Absolute: 0 10*3/uL (ref 0.0–0.1)
Basophils Relative: 0 %
Eosinophils Absolute: 0.1 10*3/uL (ref 0.0–0.5)
Eosinophils Relative: 1 %
HCT: 38.9 % (ref 36.0–46.0)
Hemoglobin: 12.5 g/dL (ref 12.0–15.0)
Immature Granulocytes: 1 %
Lymphocytes Relative: 18 %
Lymphs Abs: 1.2 10*3/uL (ref 0.7–4.0)
MCH: 29.4 pg (ref 26.0–34.0)
MCHC: 32.1 g/dL (ref 30.0–36.0)
MCV: 91.5 fL (ref 80.0–100.0)
Monocytes Absolute: 0.7 10*3/uL (ref 0.1–1.0)
Monocytes Relative: 10 %
Neutro Abs: 4.7 10*3/uL (ref 1.7–7.7)
Neutrophils Relative %: 70 %
Platelets: 155 10*3/uL (ref 150–400)
RBC: 4.25 MIL/uL (ref 3.87–5.11)
RDW: 14.1 % (ref 11.5–15.5)
WBC: 6.7 10*3/uL (ref 4.0–10.5)
nRBC: 0 % (ref 0.0–0.2)

## 2019-10-01 LAB — COMPREHENSIVE METABOLIC PANEL
ALT: 12 U/L (ref 0–44)
AST: 13 U/L — ABNORMAL LOW (ref 15–41)
Albumin: 3.7 g/dL (ref 3.5–5.0)
Alkaline Phosphatase: 59 U/L (ref 38–126)
Anion gap: 8 (ref 5–15)
BUN: 19 mg/dL (ref 6–20)
CO2: 31 mmol/L (ref 22–32)
Calcium: 9.2 mg/dL (ref 8.9–10.3)
Chloride: 93 mmol/L — ABNORMAL LOW (ref 98–111)
Creatinine, Ser: 0.76 mg/dL (ref 0.44–1.00)
GFR calc Af Amer: >60 mL/min (ref >60)
GFR calc non Af Amer: >60 mL/min (ref >60)
Glucose, Bld: 131 mg/dL — ABNORMAL HIGH (ref 70–99)
Potassium: 4.6 mmol/L (ref 3.5–5.1)
Sodium: 132 mmol/L — ABNORMAL LOW (ref 135–145)
Total Bilirubin: 0.5 mg/dL (ref 0.3–1.2)
Total Protein: 7.7 g/dL (ref 6.5–8.1)

## 2019-10-01 LAB — PROTIME-INR
INR: 1 (ref 0.8–1.2)
Prothrombin Time: 12.7 seconds (ref 11.4–15.2)

## 2019-10-01 LAB — BRAIN NATRIURETIC PEPTIDE: B Natriuretic Peptide: 800 pg/mL — ABNORMAL HIGH (ref 0.0–100.0)

## 2019-10-01 LAB — TROPONIN I (HIGH SENSITIVITY)
Troponin I (High Sensitivity): 23 ng/L — ABNORMAL HIGH (ref <18)
Troponin I (High Sensitivity): 20 ng/L — ABNORMAL HIGH (ref <18)

## 2019-10-01 LAB — BLOOD GAS, VENOUS
Acid-Base Excess: 8.4 mmol/L — ABNORMAL HIGH (ref 0.0–2.0)
Bicarbonate: 34.9 mmol/L — ABNORMAL HIGH (ref 20.0–28.0)
O2 Saturation: 94 %
Patient temperature: 37
pCO2, Ven: 55 mmHg (ref 44.0–60.0)
pH, Ven: 7.41 (ref 7.250–7.430)
pO2, Ven: 70 mmHg — ABNORMAL HIGH (ref 32.0–45.0)

## 2019-10-01 LAB — RESPIRATORY PANEL BY RT PCR (FLU A&B, COVID)
Influenza A by PCR: NEGATIVE
Influenza B by PCR: NEGATIVE
SARS Coronavirus 2 by RT PCR: NEGATIVE

## 2019-10-01 LAB — APTT: aPTT: 35 seconds (ref 24–36)

## 2019-10-01 LAB — PROCALCITONIN: Procalcitonin: <0.1 ng/mL

## 2019-10-01 LAB — GLUCOSE, CAPILLARY: Glucose-Capillary: 112 mg/dL — ABNORMAL HIGH (ref 70–99)

## 2019-10-01 LAB — LIPASE, BLOOD: Lipase: 30 U/L (ref 11–51)

## 2019-10-01 MED ORDER — FUROSEMIDE 10 MG/ML IJ SOLN
40.0000 mg | Freq: Two times a day (BID) | INTRAMUSCULAR | Status: DC
  Administered 2019-10-01 – 2019-10-04 (×6): 40 mg via INTRAVENOUS
  Filled 2019-10-01 (×6): qty 4

## 2019-10-01 MED ORDER — FUROSEMIDE 10 MG/ML IJ SOLN
40.0000 mg | Freq: Once | INTRAMUSCULAR | Status: AC
  Administered 2019-10-01: 40 mg via INTRAVENOUS
  Filled 2019-10-01: qty 4

## 2019-10-01 MED ORDER — FLUDROCORTISONE ACETATE 0.1 MG PO TABS
0.1000 mg | ORAL_TABLET | Freq: Every day | ORAL | Status: DC
  Administered 2019-10-02 – 2019-10-05 (×4): 0.1 mg via ORAL
  Filled 2019-10-01 (×4): qty 1

## 2019-10-01 MED ORDER — OLANZAPINE 10 MG PO TABS
10.0000 mg | ORAL_TABLET | Freq: Two times a day (BID) | ORAL | Status: DC
  Administered 2019-10-01 – 2019-10-05 (×8): 10 mg via ORAL
  Filled 2019-10-01 (×9): qty 1

## 2019-10-01 MED ORDER — DOCUSATE SODIUM 100 MG PO CAPS
100.0000 mg | ORAL_CAPSULE | Freq: Two times a day (BID) | ORAL | Status: DC
  Administered 2019-10-02 – 2019-10-05 (×7): 100 mg via ORAL
  Filled 2019-10-01 (×7): qty 1

## 2019-10-01 MED ORDER — POLYETHYLENE GLYCOL 3350 17 G PO PACK: 17.0000 g | PACK | Freq: Every day | ORAL | Status: DC | PRN

## 2019-10-01 MED ORDER — MELATONIN 5 MG PO TABS
5.0000 mg | ORAL_TABLET | Freq: Every day | ORAL | Status: DC
  Administered 2019-10-01 – 2019-10-03 (×3): 5 mg via ORAL
  Filled 2019-10-01 (×3): qty 1

## 2019-10-01 MED ORDER — LORAZEPAM 2 MG/ML IJ SOLN
0.5000 mg | Freq: Once | INTRAMUSCULAR | Status: AC
  Administered 2019-10-01: 0.5 mg via INTRAVENOUS
  Filled 2019-10-01: qty 1

## 2019-10-01 MED ORDER — ONDANSETRON HCL 4 MG/2ML IJ SOLN
4.0000 mg | Freq: Four times a day (QID) | INTRAMUSCULAR | Status: DC | PRN
  Administered 2019-10-03: 4 mg via INTRAVENOUS
  Filled 2019-10-01: qty 2

## 2019-10-01 MED ORDER — ADULT MULTIVITAMIN W/MINERALS CH
1.0000 | ORAL_TABLET | Freq: Every day | ORAL | Status: DC
  Administered 2019-10-02 – 2019-10-05 (×4): 1 via ORAL
  Filled 2019-10-01 (×4): qty 1

## 2019-10-01 MED ORDER — ACETAMINOPHEN 325 MG PO TABS
650.0000 mg | ORAL_TABLET | Freq: Four times a day (QID) | ORAL | Status: DC | PRN
  Administered 2019-10-02 – 2019-10-05 (×7): 650 mg via ORAL
  Filled 2019-10-01 (×7): qty 2

## 2019-10-01 MED ORDER — ENOXAPARIN SODIUM 40 MG/0.4ML ~~LOC~~ SOLN
40.0000 mg | Freq: Two times a day (BID) | SUBCUTANEOUS | Status: DC
  Administered 2019-10-01 – 2019-10-02 (×2): 40 mg via SUBCUTANEOUS
  Filled 2019-10-01 (×2): qty 0.4

## 2019-10-01 MED ORDER — INSULIN ASPART 100 UNIT/ML ~~LOC~~ SOLN
0.0000 [IU] | Freq: Three times a day (TID) | SUBCUTANEOUS | Status: DC
  Administered 2019-10-02: 1 [IU] via SUBCUTANEOUS
  Administered 2019-10-05: 2 [IU] via SUBCUTANEOUS
  Filled 2019-10-01 (×2): qty 1

## 2019-10-01 MED ORDER — FLUTICASONE FUROATE-VILANTEROL 100-25 MCG/INH IN AEPB
1.0000 | INHALATION_SPRAY | Freq: Every day | RESPIRATORY_TRACT | Status: DC
  Administered 2019-10-02 – 2019-10-05 (×4): 1 via RESPIRATORY_TRACT
  Filled 2019-10-01: qty 28

## 2019-10-01 MED ORDER — CLONAZEPAM 1 MG PO TABS
1.0000 mg | ORAL_TABLET | Freq: Two times a day (BID) | ORAL | Status: DC
  Administered 2019-10-01 – 2019-10-05 (×8): 1 mg via ORAL
  Filled 2019-10-01 (×8): qty 1

## 2019-10-01 MED ORDER — ENSURE ENLIVE PO LIQD
237.0000 mL | ORAL | Status: DC
  Administered 2019-10-04: 237 mL via ORAL

## 2019-10-01 MED ORDER — LURASIDONE HCL 40 MG PO TABS
20.0000 mg | ORAL_TABLET | Freq: Two times a day (BID) | ORAL | Status: DC
  Administered 2019-10-01 – 2019-10-05 (×8): 20 mg via ORAL
  Filled 2019-10-01 (×10): qty 1

## 2019-10-01 MED ORDER — BUPROPION HCL ER (XL) 150 MG PO TB24
150.0000 mg | ORAL_TABLET | Freq: Every day | ORAL | Status: DC
  Administered 2019-10-02 – 2019-10-05 (×4): 150 mg via ORAL
  Filled 2019-10-01 (×4): qty 1

## 2019-10-01 MED ORDER — CHLORHEXIDINE GLUCONATE 0.12 % MT SOLN
15.0000 mL | Freq: Two times a day (BID) | OROMUCOSAL | Status: DC
  Administered 2019-10-02 – 2019-10-05 (×8): 15 mL via OROMUCOSAL
  Filled 2019-10-01 (×8): qty 15

## 2019-10-01 MED ORDER — SPIRONOLACTONE 25 MG PO TABS
25.0000 mg | ORAL_TABLET | Freq: Every day | ORAL | Status: DC
  Administered 2019-10-02 – 2019-10-05 (×4): 25 mg via ORAL
  Filled 2019-10-01 (×4): qty 1

## 2019-10-01 MED ORDER — NIFEDIPINE ER OSMOTIC RELEASE 30 MG PO TB24
90.0000 mg | ORAL_TABLET | Freq: Every day | ORAL | Status: DC
  Administered 2019-10-02 – 2019-10-05 (×4): 90 mg via ORAL
  Filled 2019-10-01 (×4): qty 3
  Filled 2019-10-01: qty 1

## 2019-10-01 MED ORDER — ALBUTEROL SULFATE (2.5 MG/3ML) 0.083% IN NEBU
2.5000 mg | INHALATION_SOLUTION | RESPIRATORY_TRACT | Status: DC | PRN
  Administered 2019-10-02: 2.5 mg via RESPIRATORY_TRACT
  Filled 2019-10-01: qty 3

## 2019-10-01 MED ORDER — ASCORBIC ACID 500 MG PO TABS
1000.0000 mg | ORAL_TABLET | Freq: Every day | ORAL | Status: DC
  Administered 2019-10-02 – 2019-10-05 (×4): 1000 mg via ORAL
  Filled 2019-10-01 (×4): qty 2

## 2019-10-01 MED ORDER — ACETAMINOPHEN 650 MG RE SUPP: 650.0000 mg | Freq: Four times a day (QID) | RECTAL | Status: DC | PRN

## 2019-10-01 MED ORDER — PANTOPRAZOLE SODIUM 40 MG PO TBEC
40.0000 mg | DELAYED_RELEASE_TABLET | Freq: Every day | ORAL | Status: DC
  Administered 2019-10-01 – 2019-10-05 (×5): 40 mg via ORAL
  Filled 2019-10-01 (×5): qty 1

## 2019-10-01 MED ORDER — CALCIUM CARBONATE-VITAMIN D 500-200 MG-UNIT PO TABS
1.0000 | ORAL_TABLET | Freq: Every day | ORAL | Status: DC
  Administered 2019-10-02 – 2019-10-05 (×4): 1 via ORAL
  Filled 2019-10-01 (×4): qty 1

## 2019-10-01 MED ORDER — INSULIN ASPART 100 UNIT/ML ~~LOC~~ SOLN: 0.0000 [IU] | Freq: Every day | SUBCUTANEOUS | Status: DC

## 2019-10-01 MED ORDER — ONDANSETRON HCL 4 MG PO TABS
4.0000 mg | ORAL_TABLET | Freq: Four times a day (QID) | ORAL | Status: DC | PRN
  Administered 2019-10-03 – 2019-10-05 (×2): 4 mg via ORAL
  Filled 2019-10-01 (×2): qty 1

## 2019-10-01 MED ORDER — MORPHINE SULFATE (PF) 2 MG/ML IV SOLN
2.0000 mg | Freq: Once | INTRAVENOUS | Status: AC
  Administered 2019-10-01: 2 mg via INTRAVENOUS
  Filled 2019-10-01: qty 1

## 2019-10-01 MED ORDER — SENNA 8.6 MG PO TABS
1.0000 | ORAL_TABLET | Freq: Two times a day (BID) | ORAL | Status: DC
  Administered 2019-10-02 – 2019-10-05 (×7): 8.6 mg via ORAL
  Filled 2019-10-01 (×7): qty 1

## 2019-10-01 MED ORDER — HYDROXYZINE HCL 25 MG PO TABS
25.0000 mg | ORAL_TABLET | Freq: Four times a day (QID) | ORAL | Status: DC | PRN
  Administered 2019-10-02 – 2019-10-05 (×7): 25 mg via ORAL
  Filled 2019-10-01 (×8): qty 1

## 2019-10-01 MED ORDER — VALPROIC ACID 250 MG PO CAPS
500.0000 mg | ORAL_CAPSULE | Freq: Three times a day (TID) | ORAL | Status: DC
  Administered 2019-10-01 – 2019-10-05 (×11): 500 mg via ORAL
  Filled 2019-10-01 (×13): qty 2

## 2019-10-01 MED ORDER — ORAL CARE MOUTH RINSE
15.0000 mL | Freq: Two times a day (BID) | OROMUCOSAL | Status: DC
  Administered 2019-10-02 (×2): 15 mL via OROMUCOSAL

## 2019-10-01 MED ORDER — POLYETHYLENE GLYCOL 3350 17 G PO PACK
17.0000 g | PACK | Freq: Every day | ORAL | Status: DC
  Administered 2019-10-02 – 2019-10-05 (×4): 17 g via ORAL
  Filled 2019-10-01 (×4): qty 1

## 2019-10-01 NOTE — Progress Notes (Signed)
Pt switched to CPAP per dr Joan Mayans, tolerating well at this time. Will continue to monitor.

## 2019-10-01 NOTE — Progress Notes (Signed)
Anticoagulation monitoring(Lovenox):  59 yo female ordered Lovenox 40 mg Q24h  Filed Weights   10/01/19 1521  Weight: 99 kg (218 lb 4.1 oz)   BMI 41.2   Lab Results  Component Value Date   CREATININE 0.76 10/01/2019   CREATININE 0.78 09/26/2019   CREATININE 0.63 09/25/2019   Estimated Creatinine Clearance: 82.6 mL/min (by C-G formula based on SCr of 0.76 mg/dL). Hemoglobin & Hematocrit     Component Value Date/Time   HGB 12.5 10/01/2019 1519   HGB 14.4 06/22/2012 0014   HCT 38.9 10/01/2019 1519   HCT 43.6 06/22/2012 0014     Per Protocol for Patient with estCrcl > 30 ml/min and BMI > 40, will transition to Lovenox 40 mg Q12h.

## 2019-10-01 NOTE — ED Triage Notes (Signed)
Pt arrival via [facility] from [name] due to increased shob. Pt is on 4 L of oxygen [place] at baseline but when EMS found her on this, she was in the mid 30's o2. EMS then put her on a non-rereather which she went up to high 80s, then they put her on CPAP and her last sat was 94%.   Pt VS with EMS; HR 82, CO2 50, BP 150/64 (first A999333 systolic)., RR 30.  Pt given 2 g of nitro paste by EMS on her left breast. The facility gave her an albuterol treatment but it is unknown as to if it helped or not.   Staff at bedside: Dr. Erlinda Hong, RN, [place], tech, and RT.   Pt complains of tightness in her chest at this time.

## 2019-10-01 NOTE — ED Provider Notes (Signed)
[facility] Emergency Department Provider Note  ____________________________________________   First MD Initiated Contact with Patient 10/01/19 1523     (approximate)  I have reviewed the triage vital signs and the nursing notes.  History  Chief Complaint Shortness of Breath    HPI Natalie Thomas is a 58 y.o. 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  Per EMS, staff noticed increased work of breathing that started today.  On EMS arrival patient was on 4 L nasal cannula with oxygen in the mid 70s.  Placed on NRB with improvement to the 80s.  Ultimately placed on CPAP with improvement in oxygen to the 90s and nitro placed applied to the chest (initially hypertensive A999333 systolic, arrived [id number]).  On arrival to the emergency department she was transitioned onto BiPAP with oxygen 95%, BiPAP at 10/5.  Patient complains of some tightness in her chest and states shortness of breath started today.  She reports a history of lower extremity edema.  She does admit to wanting to drink more than her "assigned" amount of water at meals, states she likes drinking cold water.   Caveat: History limited due to patient's clinical status and respiratory distress.  Of note, she reportedly tested positive for COVID on 09/01/19 at her living facility, but had multiple repeat negative COVID PCR testing here.   Past Medical Hx Past Medical History:  Diagnosis Date  . Anemia 10/16/2017  . Anxiety and depression   . CHF (congestive heart failure) ([place])   . Chronic hyponatremia 04/08/2007   Qualifier: Diagnosis of  By: Marca Ancona RMA, Lucy    . Closed comminuted intertrochanteric  fracture of right femur ([place]) 11/07/2017  . COPD 04/08/2007   Qualifier: Diagnosis of  By: Marca Ancona RMA, Lucy    . Depression   . Diabetes mellitus, type 2 ([place])    pt denies but states that she has been treated for DM  . Essential hypertension 04/08/2007   Qualifier: Diagnosis of  By: Marca Ancona RMA, Lucy    . GERD (gastroesophageal reflux disease)   . Hyperlipidemia 10/07/2017  . Lumbar disc disease 04/09/2011  . MENOPAUSE, PREMATURE 04/08/2007   Qualifier: Diagnosis of  By: Marca Ancona RMA, Lucy    . 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 Osteoporosis 04/08/2007   Qualifier: Diagnosis of  By: Reatha Armour, Lucy    . Polycythemia secondary to smoking 04/09/2011  . 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 Schizoaffective disorder, bipolar type ([place]) 07/05/2011    Problem List Patient Active Problem List   Diagnosis Date Noted  . Shortness of breath 09/22/2019  . Acute on chronic respiratory failure with hypercapnia ([place]) 09/13/2019  . Leukocytosis 09/13/2019  . Obesity, Class III, BMI 40-49.9 (morbid obesity) ([place])   . Acute on chronic respiratory failure with hypoxemia ([place]) 09/04/2019  . COVID-19 virus infection   . 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 Nausea   . Acute gastritis   . Abdominal pain, chronic, epigastric   . Hyponatremia 07/09/2019  . Ventral hernia 07/09/2019  . Hypertensive crisis 05/01/2019  . Acute abdominal pain 03/15/2019  . Pleural effusion 03/13/2019  . Atelectasis 03/13/2019  . Acute on chronic combined systolic and diastolic CHF (  congestive heart failure) ([place]) 03/13/2019  . Type 2 diabetes mellitus without complication ([place]) A999333  . Urinary tract bacterial infections 03/09/2019  . Acute cystitis without hematuria   . Constipation   . Acute hypoxemic respiratory failure ([place]) 12/03/2018  . Irritability 11/09/2018  . Aggressive behavior 11/09/2018  . Agitation   . S/P right hip fracture 11/07/2017  . Closed  comminuted intertrochanteric fracture of right femur ([place]) 11/07/2017  . Anxiety and depression   . Dyspnea 10/16/2017  . Anemia 10/16/2017  . COPD exacerbation ([place]) 10/07/2017  . Hyperlipidemia 10/07/2017  . Acute on chronic respiratory failure with hypoxia and hypercapnia ([place]) 10/07/2017  . Incontinence of urine 08/14/2011  . Schizoaffective disorder, bipolar type ([place]) 07/05/2011  . Cough 05/08/2011  . Lumbar disc disease 04/09/2011  . Polycythemia secondary to smoking 04/09/2011  . HIP PAIN, LEFT 12/12/2007  . 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 DM (diabetes mellitus), type 2 ([place]) 04/08/2007  . MENOPAUSE, PREMATURE 04/08/2007  . Chronic hyponatremia 04/08/2007  . Schizoaffective disorder ([place]) 04/08/2007  . Essential hypertension 04/08/2007  . COPD (chronic obstructive pulmonary disease) ([place]) 04/08/2007  . GERD 04/08/2007  . Osteoporosis 04/08/2007    Past Surgical Hx Past Surgical History:  Procedure Laterality Date  . BIOPSY  07/11/2019   Procedure: BIOPSY;  Surgeon: Jerene Bears, MD;  Location: [facility] ENDOSCOPY;  Service: Gastroenterology;;  . ESOPHAGOGASTRODUODENOSCOPY (EGD) WITH PROPOFOL N/A 07/11/2019   Procedure: ESOPHAGOGASTRODUODENOSCOPY (EGD) WITH PROPOFOL;  Surgeon: Jerene Bears, MD;  Location: [place];  Service: Gastroenterology;  Laterality: N/A;  . EYE SURGERY     on left  eye, pt states that she sees bad out of the right eye and needs surgery there  . INTRAMEDULLARY (IM) NAIL INTERTROCHANTERIC Right 11/13/2017   Procedure: INTRAMEDULLARY (IM) NAIL INTERTROCHANTRIC;  Surgeon: Shona Needles, MD;  Location: [place];  Service: Orthopedics;  Laterality: Right;  . PELVIC FRACTURE SURGERY      Medications Prior to Admission medications   Medication Sig Start Date End Date Taking? Authorizing Provider  acetaminophen (TYLENOL) 500 MG tablet Take 1,000 mg by mouth every 8 (eight) hours as needed for mild pain or moderate pain. Not to exceed 3g/24h of tylenol  from all sources.    [provider]  albuterol (PROVENTIL) (2.5 MG/3ML) 0.083% nebulizer solution Take 3 mLs (2.5 mg total) by nebulization every 4 (four) hours as needed for wheezing or shortness of breath. 12/11/18   Swayze, Ava, DO  albuterol (VENTOLIN HFA) 108 (90 Base) MCG/ACT inhaler Inhale 2 puffs into the lungs every 6 (six) hours as needed for wheezing or shortness of breath.    [provider]  Alum & Mag Hydroxide-Simeth (ANTACID LIQUID PO) Take 30 mLs by mouth every 6 (six) hours as needed (heartburn).    [provider]  ascorbic acid (VITAMIN C) 1000 MG tablet Take 1 tablet (1,000 mg total) by mouth daily. 09/17/19   Lorella [place], MD  buPROPion (WELLBUTRIN XL) 150 MG 24 hr tablet Take 150 mg by mouth daily.    [provider]  Calcium Carb-Cholecalciferol (CALCIUM 600+D) 600-800 MG-UNIT TABS Take 1 tablet by mouth daily.    [provider]  clonazePAM (KLONOPIN) 1 MG tablet Take 1 tablet (1 mg total) by mouth 2 (two) times daily. 07/16/19   Mercy Riding, MD  docusate sodium (COLACE) 100 MG capsule Take 100 mg by mouth 2 (two) times daily.     [provider]  feeding supplement, ENSURE ENLIVE, (ENSURE  ENLIVE) LIQD Take 237 mLs by mouth daily. 03/14/19   Desiree Hane, MD  fludrocortisone (FLORINEF) 0.1 MG tablet Take 1 tablet (0.1 mg total) by mouth daily. 07/17/19   Mercy Riding, MD  fluticasone (FLONASE) 50 MCG/ACT nasal spray Place 2 sprays into both nostrils 2 (two) times daily.    [provider]  fluticasone furoate-vilanterol (BREO ELLIPTA) 100-25 MCG/INH AEPB Inhale 1 puff into the lungs daily.    [provider]  furosemide (LASIX) 40 MG tablet Take 1 tablet (40 mg total) by mouth daily. 09/16/19   Lorella [place]rod, MD  hydrOXYzine (ATARAX/VISTARIL) 25 MG tablet Take 25 mg by mouth every 6 (six) hours as needed for anxiety or itching.    [provider]  loperamide (IMODIUM) 2 MG capsule Take 2 mg by  mouth every 6 (six) hours as needed for diarrhea or loose stools.  06/28/19   [provider]  loratadine (CLARITIN) 10 MG tablet Take 10 mg by mouth daily.    [provider]  lurasidone (LATUDA) 20 MG TABS tablet Take 1 tablet (20 mg total) by mouth 2 (two) times daily. 05/15/19   Hongalgi, Lenis Dickinson, MD  Melatonin 5 MG TABS Take 5 mg by mouth at bedtime.    [provider]  Multiple Vitamin (MULTIVITAMIN WITH MINERALS) TABS tablet Take 1 tablet by mouth daily. 03/15/19   Desiree Hane, MD  neomycin-bacitracin-polymyxin (NEOSPORIN) ointment Apply 1 application topically as needed for wound care.    [provider]  nicotine (NICODERM CQ - DOSED IN MG/24 HOURS) 21 mg/24hr patch Place 21 mg onto the skin daily.     [provider]  NIFEdipine (PROCARDIA XL/ADALAT-CC) 90 MG 24 hr tablet Take 90 mg by mouth daily.    [provider]  nystatin (NYSTATIN) powder Apply 1 application topically 2 (two) times daily as needed (irritation). Apply topically to reddened area twice daily as needed for irritation    [provider]  OLANZapine (ZYPREXA) 10 MG tablet Take 10 mg by mouth 2 (two) times daily.    [provider]  ondansetron (ZOFRAN) 8 MG tablet Take 8 mg by mouth every 6 (six) hours as needed for nausea or vomiting.    [provider]  pantoprazole (PROTONIX) 40 MG tablet Take 1 tablet (40 mg total) by mouth daily. 07/16/19 01/12/20  Mercy Riding, MD  polyethylene glycol (MIRALAX) 17 g packet Take 17 g by mouth daily. Patient taking differently: Take 17 g by mouth daily. Mix 1 capful (17 g) in 4-8 oz of water or juice and give by mouth once daily for bowel regimen 12/11/18   Swayze, Ava, DO  sodium chloride (OCEAN) 0.65 % SOLN nasal spray Place 2 sprays into both nostrils every 2 (two) hours as needed (dryness, irritation).    [provider]  spironolactone (ALDACTONE) 25 MG tablet Take 1 tablet (25 mg total) by  mouth daily. 09/28/19   Jennye Boroughs, MD  sucralfate (CARAFATE) 1 GM/10ML suspension Take 10 mLs (1 g total) by mouth 4 (four) times daily -  with meals and at bedtime. 07/16/19   Mercy Riding, MD  valproic acid (DEPAKENE) 250 MG capsule Take 2 capsules (500 mg total) by mouth 3 (three) times daily. 09/08/19   Fritzi Mandes, MD  zinc sulfate 220 (50 Zn) MG capsule Take 1 capsule (220 mg total) by mouth daily. 09/17/19   Lorella [place]rod, MD    Allergies Clarithromycin and Penicillins  Family Hx  History reviewed. No pertinent family history.  Social Hx Social History   Tobacco Use  . Smoking status: Former Smoker    Packs/day: 0.25    Types: Cigarettes    Quit date: 07/23/2019    Years since quitting: 0.1  . Smokeless tobacco: Never Used  Substance Use Topics  . Alcohol use: No  . Drug use: No     Review of Systems  Constitutional: Negative for fever. Negative for chills. Eyes: Negative for visual changes. ENT: Negative for sore throat. Cardiovascular: Positive for chest pain. Respiratory: Positive for shortness of breath. Gastrointestinal: Negative for nausea. Negative for vomiting.  Genitourinary: Negative for dysuria. Musculoskeletal: Negative for leg swelling. Skin: Negative for rash. Neurological: Negative for headaches.   Physical Exam  Vital Signs: ED Triage Vitals  Enc Vitals Group     BP 10/01/19 1523 (!) 138/91     Pulse Rate 10/01/19 1523 81     Resp 10/01/19 1523 (!) 22     Temp 10/01/19 1523 98.2 F (36.8 C)     Temp Source 10/01/19 1523 Axillary     SpO2 10/01/19 1523 95 %     Weight 10/01/19 1521 218 lb 4.1 oz (99 kg)     Height 10/01/19 1521 5\' 1"  (1.549 m)     Head Circumference --      Peak Flow --      Pain Score 10/01/19 1523 6     Pain Loc --      Pain Edu? --      Excl. in [place]? --     Constitutional: Alert and oriented.  Arrives on CPAP, appears mildly SOB.  Appears older than stated age. Head: Normocephalic. Atraumatic. Eyes: Conjunctivae  clear. Sclera anicteric. Pupils equal and symmetric. Nose: No masses or lesions. No congestion or rhinorrhea. Mouth/Throat: Wearing mask.  Neck: No stridor. Trachea midline.  Cardiovascular: Normal rate, regular rhythm. Extremities well perfused. Respiratory: Arrives on CPAP, transition to BiPAP, oxygen 95% on room air.  RR low 20s.  Coarse lung sounds bilaterally, though limited due to body habitus. Gastrointestinal: Soft. Non-distended. Non-tender.  Genitourinary: Deferred. Musculoskeletal: Bilateral lower extremity edema, left slightly greater than right. Neurologic:  Normal speech and language. No gross focal or lateralizing neurologic deficits are appreciated.  Skin: Skin is warm, dry and intact. No rash noted. Psychiatric: Mood and affect are appropriate for situation.  EKG  Personally reviewed and interpreted by myself.   Date: 10/01/2019 Time: 1523 Rate: 81 Rhythm: Sinus Axis: Normal Intervals: Normal Minimal ST changes inferior leads, does not meet STEMI criteria No STEMI    Radiology  Personally reviewed available imaging myself.   CXR IMPRESSION:  Cardiomegaly with vascular congestion and likely interstitial edema.  Basilar atelectasis/infiltrate with small bilateral effusions.    Procedures  Procedure(s) performed (including critical care):  Procedures   Initial Impression / Assessment and Plan / MDM / ED Course  59 y.o. female who presents to the ED for shortness of breath, respiratory distress, hypoxia.  History of COPD on 2 L nasal cannula as well as combined systolic and diastolic heart failure.  Ddx: HF exacerbation, COPD exacerbation, combination of both, pulmonary infection  Will plan for labs, EKG, imaging.  Transition to BiPAP on arrival with oxygen 95% and it seems work of breathing markedly improved compared to EMS description of their arrival at her living facility.  If work of breathing continues to remain improved and labs are reassuring, can  consider transition back to nasal cannula.  Will  leave Nitropaste in place for now.  Clinical Course as of Sep 30 1817  Thu Oct 01, 2019  1657 Transitioned patient to CPAP mode as she was not tolerating BiPAP.  Doing well with this.  Oxygen maintaining mid to high 90s with improvement in work of breathing.  BNP 800.  Mildly elevated HS troponin at 23, likely demand related, will trend. XR with vascular congestion.  Will give dose of Lasix and plan to admit.   [SM]  1815 COVID negative.    [SM]    Clinical Course User Index [SM] Lilia Pro., MD     _______________________________   As part of my medical decision making I have reviewed available labs, radiology tests, reviewed old records/chart review, obtained additional history from family.    Final Clinical Impression(s) / ED Diagnosis  Final diagnoses:  SOB (shortness of breath)  Hypoxia       Note:  This document was prepared using Dragon voice recognition software and may include unintentional dictation errors.   Lilia Pro., MD 10/01/19 [phone]

## 2019-10-01 NOTE — H&P (Signed)
[facility] [place] NAME: Natalie Thomas    MR#:  [id number]  DATE OF BIRTH:  18-Jan-1961  DATE OF ADMISSION:  10/01/2019  PRIMARY CARE PHYSICIAN: System, Pcp Not In   REQUESTING/REFERRING PHYSICIAN: Dr Joan Mayans  Patient coming from :[place]:  increasing shortness of breath  HISTORY OF PRESENT ILLNESS:  Natalie Thomas  is a 59 y.o. female with a known history of anxiety, chronic depression, schizoaffective disorder, COPD on home oxygen, type II diabetes, hypertension, morbid obesity comes from [place] with shortness of breath respiratory distress and hypoxia. Per EMS staff noticed increased work of breathing started today. She is normally on 4 L nasal cannula oxygen sats dropped out in the mid-70s. She was placed on not rebreather with improvement to 80s.   ED course: Ultimately placed on CPAP with improvement and in the emergency room she was transitioning to BiPAP with oxygen saturations 95%. Patient complained of some tightness tightness did not tolerate the BiPAP setting hands now change to CPAP. Patient currently is setting 97% on current CPAP setting. Her blood pressure is stable. She is able to complete sentence is remaining on CPAP.  pt has been reported patient has been drinking significant amount of water at the facility noted by staff despite her on fluid restriction.-- sHe has history of psychogenic polydipsia.  BNP was 800. She received Lasix.  Pt is being admitted with acute on chronic hypoxic respiratory failure secondary to CHF combined systolic diastolic. PAST MEDICAL HISTORY:   Past Medical History:  Diagnosis Date  . Anemia 10/16/2017  . Anxiety and depression   . CHF (congestive heart failure) ([place])   . Chronic hyponatremia 04/08/2007   Qualifier: Diagnosis of  By: Marca Ancona RMA, Lucy    . Closed comminuted intertrochanteric fracture of right femur ([place]) 11/07/2017  . COPD 04/08/2007   Qualifier:  Diagnosis of  By: Marca Ancona RMA, Lucy    . Depression   . Diabetes mellitus, type 2 ([place])    pt denies but states that she has been treated for DM  . Essential hypertension 04/08/2007   Qualifier: Diagnosis of  By: Marca Ancona RMA, Lucy    . GERD (gastroesophageal reflux disease)   . Hyperlipidemia 10/07/2017  . Lumbar disc disease 04/09/2011  . MENOPAUSE, PREMATURE 04/08/2007   Qualifier: Diagnosis of  By: Marca Ancona RMA, Lucy    . 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 Osteoporosis 04/08/2007   Qualifier: Diagnosis of  By: Reatha Armour, Lucy    . Polycythemia secondary to smoking 04/09/2011  . 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 Schizoaffective disorder, bipolar type ([place]) 07/05/2011    PAST SURGICAL HISTOIRY:   Past Surgical History:  Procedure Laterality Date  . BIOPSY  07/11/2019   Procedure: BIOPSY;  Surgeon: Jerene Bears, MD;  Location: [facility] ENDOSCOPY;  Service: Gastroenterology;;  . ESOPHAGOGASTRODUODENOSCOPY (EGD) WITH PROPOFOL N/A 07/11/2019   Procedure: ESOPHAGOGASTRODUODENOSCOPY (EGD) WITH PROPOFOL;  Surgeon: Jerene Bears, MD;  Location: [facility];  Service: Gastroenterology;  Laterality: N/A;  . EYE SURGERY     on left  eye, pt states that she sees bad out of the right eye and needs surgery there  . INTRAMEDULLARY (IM) NAIL INTERTROCHANTERIC Right 11/13/2017   Procedure: INTRAMEDULLARY (IM) NAIL INTERTROCHANTRIC;  Surgeon: Shona Needles, MD;  Location: [facility];  Service: Orthopedics;  Laterality: Right;  . PELVIC FRACTURE SURGERY  SOCIAL HISTORY:   Social History   Tobacco Use  . Smoking status: Former Smoker    Packs/day: 0.25    Types: Cigarettes    Quit date: 07/23/2019    Years since quitting: 0.1  . Smokeless tobacco: Never Used  Substance Use Topics  . Alcohol use: No    FAMILY HISTORY:  History reviewed. No pertinent family history.  DRUG ALLERGIES:   Allergies  Allergen Reactions  . Clarithromycin Itching   . Penicillins Itching    Has tolerated cefazolin before  Has patient had a PCN reaction causing immediate rash, facial/tongue/throat swelling, SOB or lightheadedness with hypotension: No Has patient had a PCN reaction causing severe rash involving mucus membranes or skin necrosis: No Has patient had a PCN reaction that required hospitalization: Unknown Has patient had a PCN reaction occurring within the last 10 years: No If all of the above answers are "NO", then may proceed with Cephalosporin use.     REVIEW OF SYSTEMS:  Review of Systems  Constitutional: Negative for chills, fever and weight loss.  HENT: Negative for ear discharge, ear pain and nosebleeds.   Eyes: Negative for blurred vision, pain and discharge.  Respiratory: Positive for shortness of breath. Negative for sputum production, wheezing and stridor.   Cardiovascular: Negative for chest pain, palpitations, orthopnea and PND.  Gastrointestinal: Negative for abdominal pain, diarrhea, nausea and vomiting.  Genitourinary: Negative for frequency and urgency.  Musculoskeletal: Negative for back pain and joint pain.  Neurological: Negative for sensory change, speech change, focal weakness and weakness.  Psychiatric/Behavioral: Negative for depression and hallucinations. The patient is not nervous/anxious.      MEDICATIONS AT HOME:   Prior to Admission medications   Medication Sig Start Date End Date Taking? Authorizing Provider  acetaminophen (TYLENOL) 500 MG tablet Take 1,000 mg by mouth every 8 (eight) hours as needed for mild pain or moderate pain. Not to exceed 3g/24h of tylenol from all sources.   Yes [provider]  albuterol (PROVENTIL) (2.5 MG/3ML) 0.083% nebulizer solution Take 3 mLs (2.5 mg total) by nebulization every 4 (four) hours as needed for wheezing or shortness of breath. 12/11/18  Yes Swayze, Ava, DO  albuterol (VENTOLIN HFA) 108 (90 Base) MCG/ACT inhaler Inhale 2 puffs into the lungs every 6 (six)  hours as needed for wheezing or shortness of breath.   Yes [provider]  Alum & Mag Hydroxide-Simeth (ANTACID LIQUID PO) Take 30 mLs by mouth every 6 (six) hours as needed (heartburn).    [provider]  ascorbic acid (VITAMIN C) 1000 MG tablet Take 1 tablet (1,000 mg total) by mouth daily. 09/17/19   Lorella Nimrod, MD  buPROPion (WELLBUTRIN XL) 150 MG 24 hr tablet Take 150 mg by mouth daily.    [provider]  clonazePAM (KLONOPIN) 1 MG tablet Take 1 tablet (1 mg total) by mouth 2 (two) times daily. 07/16/19   Mercy Riding, MD  docusate sodium (COLACE) 100 MG capsule Take 100 mg by mouth 2 (two) times daily.     [provider]  feeding supplement, ENSURE ENLIVE, (ENSURE ENLIVE) LIQD Take 237 mLs by mouth daily. 03/14/19   Desiree Hane, MD  fludrocortisone (FLORINEF) 0.1 MG tablet Take 1 tablet (0.1 mg total) by mouth daily. 07/17/19   Mercy Riding, MD  fluticasone furoate-vilanterol (BREO ELLIPTA) 100-25 MCG/INH AEPB Inhale 1 puff into the lungs daily.    [provider]  furosemide (LASIX) 40 MG tablet Take 1 tablet (40  mg total) by mouth daily. 09/16/19   Lorella Nimrod, MD  hydrOXYzine (ATARAX/VISTARIL) 25 MG tablet Take 25 mg by mouth every 6 (six) hours as needed for anxiety or itching.    [provider]  loperamide (IMODIUM) 2 MG capsule Take 2 mg by mouth every 6 (six) hours as needed for diarrhea or loose stools.  06/28/19   [provider]  loratadine (CLARITIN) 10 MG tablet Take 10 mg by mouth daily.    [provider]  lurasidone (LATUDA) 20 MG TABS tablet Take 1 tablet (20 mg total) by mouth 2 (two) times daily. 05/15/19   Hongalgi, Lenis Dickinson, MD  Melatonin 5 MG TABS Take 5 mg by mouth at bedtime.    [provider]  Multiple Vitamin (MULTIVITAMIN WITH MINERALS) TABS tablet Take 1 tablet by mouth daily. 03/15/19   Desiree Hane, MD  neomycin-bacitracin-polymyxin (NEOSPORIN) ointment Apply 1 application  topically as needed for wound care.    [provider]  nicotine (NICODERM CQ - DOSED IN MG/24 HOURS) 21 mg/24hr patch Place 21 mg onto the skin daily.     [provider]  NIFEdipine (PROCARDIA XL/ADALAT-CC) 90 MG 24 hr tablet Take 90 mg by mouth daily.    [provider]  nystatin (NYSTATIN) powder Apply 1 application topically 2 (two) times daily as needed (irritation). Apply topically to reddened area twice daily as needed for irritation    [provider]  OLANZapine (ZYPREXA) 10 MG tablet Take 10 mg by mouth 2 (two) times daily.    [provider]  ondansetron (ZOFRAN) 8 MG tablet Take 8 mg by mouth every 6 (six) hours as needed for nausea or vomiting.    [provider]  pantoprazole (PROTONIX) 40 MG tablet Take 1 tablet (40 mg total) by mouth daily. 07/16/19 01/12/20  Mercy Riding, MD  polyethylene glycol (MIRALAX) 17 g packet Take 17 g by mouth daily. Patient taking differently: Take 17 g by mouth daily. Mix 1 capful (17 g) in 4-8 oz of water or juice and give by mouth once daily for bowel regimen 12/11/18   Swayze, Ava, DO  sodium chloride (OCEAN) 0.65 % SOLN nasal spray Place 2 sprays into both nostrils every 2 (two) hours as needed (dryness, irritation).    [provider]  spironolactone (ALDACTONE) 25 MG tablet Take 1 tablet (25 mg total) by mouth daily. 09/28/19   Jennye Boroughs, MD  sucralfate (CARAFATE) 1 GM/10ML suspension Take 10 mLs (1 g total) by mouth 4 (four) times daily -  with meals and at bedtime. 07/16/19   Mercy Riding, MD  valproic acid (DEPAKENE) 250 MG capsule Take 2 capsules (500 mg total) by mouth 3 (three) times daily. 09/08/19   Fritzi Mandes, MD  zinc sulfate 220 (50 Zn) MG capsule Take 1 capsule (220 mg total) by mouth daily. 09/17/19   Lorella Nimrod, MD      VITAL SIGNS:  Blood pressure 126/75, pulse 70, temperature 98.2 F (36.8 C), temperature source Axillary, resp. rate 18, height 5\' 1"  (1.549 m),  weight 99 kg, SpO2 96 %.  PHYSICAL EXAMINATION:  GENERAL:  59 y.o.-year-old patient lying in the bed with no acute distress. Obese EYES: Pupils equal, round, reactive to light and accommodation. No scleral icterus.  HEENT: Head atraumatic, normocephalic. Oropharynx and nasopharynx clear. CPAP+ NECK:  Supple, no jugular venous distention. No thyroid enlargement, no tenderness.  LUNGS: Normal breath sounds bilaterally, no wheezing, rales,rhonchi or crepitation. No use of  accessory muscles of respiration.  CARDIOVASCULAR: S1, S2 normal. No murmurs, rubs, or gallops.  ABDOMEN: Soft, nontender, nondistended. Bowel sounds present. No organomegaly or mass.  EXTREMITIES: ++ pedal edema, no cyanosis, or clubbing.  NEUROLOGIC: Cranial nerves II through XII are intact. Muscle strength 5/5 in all extremities. Sensation intact. Gait not checked.  PSYCHIATRIC: The patient is alert and oriented x 2.  SKIN: No obvious rash, lesion, or ulcer.   LABORATORY PANEL:   CBC Recent Labs  Lab 10/01/19 1519  WBC 6.7  HGB 12.5  HCT 38.9  PLT 155   ------------------------------------------------------------------------------------------------------------------  Chemistries  Recent Labs  Lab 10/01/19 1519  NA 132*  K 4.6  CL 93*  CO2 31  GLUCOSE 131*  BUN 19  CREATININE 0.76  CALCIUM 9.2  AST 13*  ALT 12  ALKPHOS 59  BILITOT 0.5   ------------------------------------------------------------------------------------------------------------------  Cardiac Enzymes No results for input(s): TROPONINI in the last 168 hours. ------------------------------------------------------------------------------------------------------------------  RADIOLOGY:  DG Chest Port 1 View  Result Date: 10/01/2019 CLINICAL DATA:  Shortness of breath and chest tightness. EXAM: PORTABLE CHEST 1 VIEW COMPARISON:  09/23/2019 FINDINGS: 1554 hours. The cardio pericardial silhouette is enlarged. There is pulmonary vascular  congestion without overt pulmonary edema. Likely associated interstitial edema. Bibasilar atelectasis/infiltrate with small bilateral pleural effusions. Bones are diffusely demineralized. Telemetry leads overlie the chest. IMPRESSION: Cardiomegaly with vascular congestion and likely interstitial edema. Basilar atelectasis/infiltrate with small bilateral effusions. Electronically Signed   By: Misty Stanley M.D.   On: 10/01/2019 16:30    EKG:    IMPRESSION AND PLAN:  [name] a58 y.o.femalewith a known history of COPD on chronic 2 L nasal cannula oxygen, diastolic congestive heart failure chronic, hypertension, morbid obesity, hyperlipidemia, type II diabetes, schizoaffective disorder, chronic hyponatremia suspected psychogenic polydipsia comes to the emergency room from [place] with shortness of breath and increased oxygen requirement. Patient apparently has been drinking a lot of water according to the staff. Patient herself reports craving for ice water.  Acute on chronic hypoxic/ hypercarbic Respiratory failure secondary to congestive heart failure acute on chronic diastolic with excess water intake with history of secondary polydipsia -patient presented with increasing shortness of breath with increased oxygen requirement. -She is currently on CPAP. Sats are 97%. She is awake and alert -IV Lasix 40 mg BID -low sodium diet with fluid restriction 2 L -PRN inhalers -wean to nasal cannula oxygen sats remain greater than 92% -she advised on fluid restriction.  Type II diabetes-- not on any meds -sugars stable -sliding scale insulin  Hypertension -continue home meds  Schizoaffective disorder will continue Klonopin, Zyprexa, melatonin and Latuda  history of chronic hyponatremia -patient apparently is noted to sneak ice water at the facility -she has history of psychogenic polydipsia -on  water restriction will continue the same  Morbid obesity  Multiple  admissions for similar problems-- palliative care consultation  Family Communication :none Consults : Code Status :Full DVT prophylaxis :lovenox  TOTAL TIME TAKING CARE OF THIS PATIENT: *50* minutes.    Fritzi Mandes M.D  [facility]     CC: Primary care physician; System, Pcp Not In

## 2019-10-02 ENCOUNTER — Ambulatory Visit: Payer: Medicare Other | Admitting: Family

## 2019-10-02 ENCOUNTER — Encounter: Payer: Self-pay | Admitting: Internal Medicine

## 2019-10-02 DIAGNOSIS — Z7189 Other specified counseling: Secondary | ICD-10-CM

## 2019-10-02 DIAGNOSIS — F250 Schizoaffective disorder, bipolar type: Secondary | ICD-10-CM

## 2019-10-02 DIAGNOSIS — E669 Obesity, unspecified: Secondary | ICD-10-CM

## 2019-10-02 DIAGNOSIS — Z6839 Body mass index (BMI) 39.0-39.9, adult: Secondary | ICD-10-CM

## 2019-10-02 DIAGNOSIS — Z515 Encounter for palliative care: Secondary | ICD-10-CM

## 2019-10-02 LAB — GLUCOSE, CAPILLARY
Glucose-Capillary: 81 mg/dL (ref 70–99)
Glucose-Capillary: 119 mg/dL — ABNORMAL HIGH (ref 70–99)
Glucose-Capillary: 128 mg/dL — ABNORMAL HIGH (ref 70–99)
Glucose-Capillary: 105 mg/dL — ABNORMAL HIGH (ref 70–99)
Glucose-Capillary: 97 mg/dL (ref 70–99)

## 2019-10-02 LAB — MRSA PCR SCREENING: MRSA by PCR: POSITIVE — AB

## 2019-10-02 LAB — BASIC METABOLIC PANEL
Anion gap: 9 (ref 5–15)
BUN: 21 mg/dL — ABNORMAL HIGH (ref 6–20)
CO2: 32 mmol/L (ref 22–32)
Calcium: 8.7 mg/dL — ABNORMAL LOW (ref 8.9–10.3)
Chloride: 91 mmol/L — ABNORMAL LOW (ref 98–111)
Creatinine, Ser: 0.82 mg/dL (ref 0.44–1.00)
GFR calc Af Amer: >60 mL/min (ref >60)
GFR calc non Af Amer: >60 mL/min (ref >60)
Glucose, Bld: 135 mg/dL — ABNORMAL HIGH (ref 70–99)
Potassium: 3.4 mmol/L — ABNORMAL LOW (ref 3.5–5.1)
Sodium: 132 mmol/L — ABNORMAL LOW (ref 135–145)

## 2019-10-02 MED ORDER — NICOTINE 21 MG/24HR TD PT24
21.0000 mg | MEDICATED_PATCH | Freq: Every day | TRANSDERMAL | Status: DC
  Administered 2019-10-02 – 2019-10-05 (×4): 21 mg via TRANSDERMAL
  Filled 2019-10-02 (×4): qty 1

## 2019-10-02 MED ORDER — SUCRALFATE 1 GM/10ML PO SUSP
1.0000 g | Freq: Three times a day (TID) | ORAL | Status: DC
  Administered 2019-10-02 – 2019-10-05 (×12): 1 g via ORAL
  Filled 2019-10-02 (×14): qty 10

## 2019-10-02 MED ORDER — ALPRAZOLAM 0.5 MG PO TABS
1.0000 mg | ORAL_TABLET | Freq: Once | ORAL | Status: AC
  Administered 2019-10-02: 1 mg via ORAL
  Filled 2019-10-02: qty 2

## 2019-10-02 MED ORDER — CHLORHEXIDINE GLUCONATE CLOTH 2 % EX PADS
6.0000 | MEDICATED_PAD | Freq: Every day | CUTANEOUS | Status: DC
  Administered 2019-10-02 – 2019-10-05 (×3): 6 via TOPICAL

## 2019-10-02 MED ORDER — POTASSIUM CHLORIDE CRYS ER 20 MEQ PO TBCR
40.0000 meq | EXTENDED_RELEASE_TABLET | Freq: Once | ORAL | Status: AC
  Administered 2019-10-02: 40 meq via ORAL
  Filled 2019-10-02: qty 2

## 2019-10-02 MED ORDER — MUPIROCIN 2 % EX OINT
1.0000 "application " | TOPICAL_OINTMENT | Freq: Two times a day (BID) | CUTANEOUS | Status: DC
  Administered 2019-10-02 – 2019-10-05 (×8): 1 via NASAL
  Filled 2019-10-02: qty 22

## 2019-10-02 MED ORDER — KETOROLAC TROMETHAMINE 15 MG/ML IJ SOLN
15.0000 mg | Freq: Four times a day (QID) | INTRAMUSCULAR | Status: DC | PRN
  Administered 2019-10-02 – 2019-10-05 (×11): 15 mg via INTRAVENOUS
  Filled 2019-10-02 (×11): qty 1

## 2019-10-02 MED ORDER — LORAZEPAM 2 MG/ML IJ SOLN
1.0000 mg | Freq: Once | INTRAMUSCULAR | Status: AC
  Administered 2019-10-02: 1 mg via INTRAVENOUS
  Filled 2019-10-02: qty 1

## 2019-10-02 MED ORDER — HYDRALAZINE HCL 20 MG/ML IJ SOLN
10.0000 mg | Freq: Four times a day (QID) | INTRAMUSCULAR | Status: DC | PRN
  Administered 2019-10-02: 10 mg via INTRAVENOUS
  Filled 2019-10-02: qty 1

## 2019-10-02 MED ORDER — ENOXAPARIN SODIUM 40 MG/0.4ML ~~LOC~~ SOLN
40.0000 mg | SUBCUTANEOUS | Status: DC
  Administered 2019-10-03 – 2019-10-05 (×3): 40 mg via SUBCUTANEOUS
  Filled 2019-10-02 (×3): qty 0.4

## 2019-10-02 NOTE — Progress Notes (Signed)
Paged MD regarding elevated BP  MD to order IV Hydralazine

## 2019-10-02 NOTE — Progress Notes (Addendum)
Patient is paranoid, hallucinating and very aggravated.   I have given her pain and anti-anxiety medication.  Pt complains of SOB- gave breathing treatment.     Messaged MD-  Ask if can consult Psych.

## 2019-10-02 NOTE — Progress Notes (Signed)
Gave 1 dose of IV hydralazine for SBP > 180 (189/61)

## 2019-10-02 NOTE — Progress Notes (Addendum)
OVERNIGHT Patient with behaviors of acute delirium.  Calling out multiple times to nurses station. Hallucinating. She reports to me she has not received all of the medications that he takes at home and she has severe "physical and mental health needs" Discussed home regimen and and her current symptoms of anxiety likely multifactorial with hospitalization, issues with fit of CPAP  issues with oxygen in room and broken call light.  She is requesting additional. She had no effect of xanax given earlier. Additional benzo dose ordered. Monitoring closely in progressive unit

## 2019-10-02 NOTE — Progress Notes (Signed)
Patient is sleeping soundly now; resps even and unlabored.

## 2019-10-02 NOTE — Consult Note (Signed)
Consultation Note Date: 10/02/2019   Patient Name: Natalie Thomas  DOB: [birth year]  MRN: [id number]  Age / Sex: 59 y.o., female  PCP: System, Pcp Not In Referring Physician: Fritzi Mandes, MD  Reason for Consultation: Establishing goals of care and Psychosocial/spiritual support  HPI/Patient Profile: 59 y.o. female  with past medical history of heart failure, unable to keep compliance with fluid restrictions, chronic hyponatremia, HTN/HLD, DM 2, COPD, schizoaffective disorder, anxiety and depression, malignant neoplasm of vulva in 2008, osteoporosis, polycythemia secondary to smoking, right hip fracture with IM nailing 2019, pelvic fracture surgery date unknown admitted on 10/01/2019 with CHF exacerbation.   Clinical Assessment and Goals of Care: [name], Thomas, is resting quietly in bed.  Attending is at bedside talking about goals of care and the treatment plan.  Natalie Thomas is alert and oriented, able to make her basic needs known, but is distressed.  She has concerns about her breathing, states that she feels people are not listening to her, and is voicing feelings of persecution.   She can seem quite childlike at times.  At this point she seems fixated on "power of attorney".  We talked about her legal guardian through [place], but Natalie Thomas tells me that she wants to talk with her aunt [name].  Natalie Thomas tells me that she recently had a birthday, but she has not been able to get her Christmas gifts from her aunt.   I reassure her that we will continue to care for her.  Call to legal guardian, Natalie Thomas, DSS SW.    [name] states that she has been Administrator, sports for 2 to 3 years now, but that [name] has been with DSS for a long time now.  Until 2 to 3 months ago, [name] was living in assisted living, [place].  [name] tells me that [facility] felt that they could not continue to  care for [name] when she needed fluid restrictions.  She was transferred to [facility], just a few months ago.      We talked about her chronic illness burden including but not limited to heart failure.  We talked about fluid restrictions, and [name]'s inability to keep these restrictions.  We also talked about 8 hospital stays in the last 6 months.  We talked about the importance of fluid restrictions, and the delicate balance between keeping the heart dry and the kidneys wet.  We talked about CODE STATUS, "treat the treatable", but allow a natural death.  [name] shares that there is formalized paperwork from [place] that must be completed.  She will email me the forms.  We talked about the benefits of outpatient palliative services.  [name] is agreeable for outpatient palliative to follow.  Conference with attending, bedside nursing staff, transition of care team related to patient condition, needs, goals of care and CODE STATUS discussions, disposition plan to return to [facility] with outpatient palliative services.   HCPOA    LEGAL GUARDIAN -  Natalie Thomas [name]) for 2-3 years, but been with DSS  for long time.     SUMMARY OF RECOMMENDATIONS   Continue to treat the treatable Emailing DSS paperwork to complete DNR status Return to [facility] healthcare Outpatient palliative to follow  Code Status/Advance Care Planning:  Full code -DSS social worker is emailing states/County DNR forms  Symptom Management:   Per hospitalist, no additional needs at this time.  Palliative Prophylaxis:   Oral Care and Turn Reposition  Additional Recommendations (Limitations, Scope, Preferences):  Full Scope Treatment  Psycho-social/Spiritual:   Desire for further Chaplaincy support:no  Additional Recommendations: Caregiving  Support/Resources and Education on Hospice  Prognosis:   Unable to determine, based on outcomes.  6 months or less would not be surprising based on 8  hospital stays and 1 ED visit in the last 6 months, chronic illness burden, poor functional status is a resident of long-term care.  Discharge Planning: Anticipate return to residential long-term care facility, [facility]      Primary Diagnoses: Present on Admission: . Acute on chronic respiratory failure ([place])   I have reviewed the medical record, interviewed the patient and family, and examined the patient. The following aspects are pertinent.  Past Medical History:  Diagnosis Date  . Anemia 10/16/2017  . Anxiety and depression   . CHF (congestive heart failure) ([place])   . Chronic hyponatremia 04/08/2007   Qualifier: Diagnosis of  By: Marca Ancona RMA, Lucy    . Closed comminuted intertrochanteric fracture of right femur ([place]) 11/07/2017  . COPD 04/08/2007   Qualifier: Diagnosis of  By: Marca Ancona RMA, Lucy    . Depression   . Diabetes mellitus, type 2 ([place])    pt denies but states that she has been treated for DM  . Essential hypertension 04/08/2007   Qualifier: Diagnosis of  By: Marca Ancona RMA, Lucy    . GERD (gastroesophageal reflux disease)   . Hyperlipidemia 10/07/2017  . Lumbar disc disease 04/09/2011  . MENOPAUSE, PREMATURE 04/08/2007   Qualifier: Diagnosis of  By: Marca Ancona RMA, Lucy    . 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 Osteoporosis 04/08/2007   Qualifier: Diagnosis of  By: Reatha Armour, Lucy    . Polycythemia secondary to smoking 04/09/2011  . 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 Schizoaffective disorder, bipolar type ([place]) 07/05/2011   Social History   Socioeconomic History  . Marital status: Divorced    Spouse name: Not on file  . Number of children: Not on file  . Years of education: Not on file  . Highest education level: Not on file  Occupational History  . Occupation: Disabled  Tobacco Use  . Smoking status: Former Smoker    Packs/day: 0.25    Types: Cigarettes    Quit date: 07/23/2019    Years since  quitting: 0.1  . Smokeless tobacco: Never Used  Substance and Sexual Activity  . Alcohol use: No  . Drug use: No  . Sexual activity: Never  Other Topics Concern  . Not on file  Social History Narrative   single   Social Determinants of Health   Financial Resource Strain:   . Difficulty of Paying Living Expenses:   Food Insecurity:   . Worried About Charity fundraiser in the Last Year:   . Arboriculturist in the Last Year:   Transportation Needs:   . Film/video editor (Medical):   Marland Kitchen Lack of Transportation (Non-Medical):   Physical Activity:   . Days of Exercise per  Week:   . Minutes of Exercise per Session:   Stress:   . Feeling of Stress :   Social Connections:   . Frequency of Communication with Friends and Family:   . Frequency of Social Gatherings with Friends and Family:   . Attends Religious Services:   . Active Member of Clubs or Organizations:   . Attends Archivist Meetings:   Marland Kitchen Marital Status:    History reviewed. No pertinent family history. Scheduled Meds: . ascorbic acid  1,000 mg Oral Daily  . buPROPion  150 mg Oral Daily  . calcium-vitamin D  1 tablet Oral Daily  . chlorhexidine  15 mL Mouth Rinse BID  . Chlorhexidine Gluconate Cloth  6 each Topical Q0600  . clonazePAM  1 mg Oral BID  . docusate sodium  100 mg Oral BID  . enoxaparin (LOVENOX) injection  40 mg Subcutaneous Q12H  . feeding supplement (ENSURE ENLIVE)  237 mL Oral Q24H  . fludrocortisone  0.1 mg Oral Daily  . fluticasone furoate-vilanterol  1 puff Inhalation Daily  . furosemide  40 mg Intravenous Q12H  . insulin aspart  0-5 Units Subcutaneous QHS  . insulin aspart  0-9 Units Subcutaneous TID WC  . lurasidone  20 mg Oral BID  . mouth rinse  15 mL Mouth Rinse q12n4p  . melatonin  5 mg Oral QHS  . multivitamin with minerals  1 tablet Oral Daily  . mupirocin ointment  1 application Nasal BID  . NIFEdipine  90 mg Oral Daily  . OLANZapine  10 mg Oral BID  . pantoprazole  40  mg Oral Daily  . polyethylene glycol  17 g Oral Daily  . senna  1 tablet Oral BID  . spironolactone  25 mg Oral Daily  . sucralfate  1 g Oral TID WC & HS  . valproic acid  500 mg Oral TID   Continuous Infusions: PRN Meds:.acetaminophen **OR** acetaminophen, albuterol, hydrOXYzine, ketorolac, ondansetron **OR** ondansetron (ZOFRAN) IV, polyethylene glycol Medications Prior to Admission:  Prior to Admission medications   Medication Sig Start Date End Date Taking? Authorizing Provider  acetaminophen (TYLENOL) 500 MG tablet Take 1,000 mg by mouth every 8 (eight) hours as needed for mild pain or moderate pain. Not to exceed 3g/24h of tylenol from all sources.   Yes [provider]  albuterol (PROVENTIL) (2.5 MG/3ML) 0.083% nebulizer solution Take 3 mLs (2.5 mg total) by nebulization every 4 (four) hours as needed for wheezing or shortness of breath. 12/11/18  Yes Swayze, Ava, DO  albuterol (VENTOLIN HFA) 108 (90 Base) MCG/ACT inhaler Inhale 2 puffs into the lungs every 6 (six) hours as needed for wheezing or shortness of breath.   Yes [provider]  Alum & Mag Hydroxide-Simeth (ANTACID LIQUID PO) Take 30 mLs by mouth every 6 (six) hours as needed (heartburn).   Yes [provider]  ascorbic acid (VITAMIN C) 1000 MG tablet Take 1 tablet (1,000 mg total) by mouth daily. 09/17/19  Yes Lorella Nimrod, MD  buPROPion (WELLBUTRIN XL) 150 MG 24 hr tablet Take 150 mg by mouth daily.   Yes [provider]  Calcium Carb-Cholecalciferol 600-400 MG-UNIT TABS Take 1 tablet by mouth daily.   Yes [provider]  clonazePAM (KLONOPIN) 1 MG tablet Take 1 tablet (1 mg total) by mouth 2 (two) times daily. 07/16/19  Yes Mercy Riding, MD  docusate sodium (COLACE) 100 MG capsule Take 100 mg by mouth 2 (two) times daily.    Yes  [provider]  feeding supplement, ENSURE ENLIVE, (ENSURE ENLIVE) LIQD Take 237 mLs by mouth daily. 03/14/19  Yes Oretha Milch D, MD    fludrocortisone (FLORINEF) 0.1 MG tablet Take 1 tablet (0.1 mg total) by mouth daily. 07/17/19  Yes Gonfa, Charlesetta Ivory, MD  fluticasone furoate-vilanterol (BREO ELLIPTA) 100-25 MCG/INH AEPB Inhale 1 puff into the lungs daily.   Yes [provider]  furosemide (LASIX) 40 MG tablet Take 1 tablet (40 mg total) by mouth daily. 09/16/19  Yes Lorella Nimrod, MD  hydrOXYzine (ATARAX/VISTARIL) 25 MG tablet Take 25 mg by mouth every 6 (six) hours as needed for anxiety or itching.   Yes [provider]  loperamide (IMODIUM) 2 MG capsule Take 2 mg by mouth every 6 (six) hours as needed for diarrhea or loose stools.  06/28/19  Yes [provider]  loratadine (CLARITIN) 10 MG tablet Take 10 mg by mouth daily.   Yes [provider]  lurasidone (LATUDA) 20 MG TABS tablet Take 1 tablet (20 mg total) by mouth 2 (two) times daily. 05/15/19  Yes Hongalgi, Lenis Dickinson, MD  Melatonin 5 MG TABS Take 5 mg by mouth at bedtime.   Yes [provider]  Multiple Vitamin (MULTIVITAMIN WITH MINERALS) TABS tablet Take 1 tablet by mouth daily. 03/15/19  Yes Oretha Milch D, MD  neomycin-bacitracin-polymyxin (NEOSPORIN) ointment Apply 1 application topically as needed for wound care.   Yes [provider]  nicotine (NICODERM CQ - DOSED IN MG/24 HOURS) 21 mg/24hr patch Place 21 mg onto the skin daily.    Yes [provider]  NIFEdipine (PROCARDIA XL/ADALAT-CC) 90 MG 24 hr tablet Take 90 mg by mouth daily.   Yes [provider]  nystatin (NYSTATIN) powder Apply 1 application topically 2 (two) times daily as needed (irritation). Apply topically to reddened area twice daily as needed for irritation   Yes [provider]  OLANZapine (ZYPREXA) 10 MG tablet Take 10 mg by mouth 2 (two) times daily.   Yes [provider]  ondansetron (ZOFRAN) 8 MG tablet Take 8 mg by mouth every 6 (six) hours as needed for nausea or vomiting.   Yes [provider]   pantoprazole (PROTONIX) 40 MG tablet Take 1 tablet (40 mg total) by mouth daily. 07/16/19 01/12/20 Yes Mercy Riding, MD  polyethylene glycol (MIRALAX) 17 g packet Take 17 g by mouth daily. Patient taking differently: Take 17 g by mouth daily. Mix 1 capful (17 g) in 4-8 oz of water or juice and give by mouth once daily for bowel regimen 12/11/18  Yes Swayze, Ava, DO  sodium chloride (OCEAN) 0.65 % SOLN nasal spray Place 2 sprays into both nostrils every 2 (two) hours as needed (dryness, irritation).   Yes [provider]  spironolactone (ALDACTONE) 25 MG tablet Take 1 tablet (25 mg total) by mouth daily. 09/28/19  Yes Jennye Boroughs, MD  sucralfate (CARAFATE) 1 GM/10ML suspension Take 10 mLs (1 g total) by mouth 4 (four) times daily -  with meals and at bedtime. 07/16/19  Yes Mercy Riding, MD  valproic acid (DEPAKENE) 250 MG capsule Take 2 capsules (500 mg total) by mouth 3 (three) times daily. 09/08/19  Yes Fritzi Mandes, MD   Allergies  Allergen Reactions  . Clarithromycin Itching  . Penicillins Itching    Has tolerated cefazolin before  Has patient had a PCN reaction causing immediate rash, facial/tongue/throat swelling, SOB or lightheadedness with hypotension: No Has patient had a PCN reaction causing  severe rash involving mucus membranes or skin necrosis: No Has patient had a PCN reaction that required hospitalization: Unknown Has patient had a PCN reaction occurring within the last 10 years: No If all of the above answers are "NO", then may proceed with Cephalosporin use.    Review of Systems  Unable to perform ROS: Psychiatric disorder    Physical Exam Vitals and nursing note reviewed.  Constitutional:      General: She is not in acute distress.    Appearance: She is obese. She is ill-appearing.  Cardiovascular:     Rate and Rhythm: Regular rhythm.  Pulmonary:     Effort: Pulmonary effort is normal. No tachypnea.  Musculoskeletal:     Right lower leg: No edema.     Left  lower leg: No edema.  Skin:    General: Skin is warm and dry.  Neurological:     Mental Status: She is alert and oriented to person, place, and time.  Psychiatric:        Mood and Affect: Mood is anxious.        Behavior: Behavior is agitated.     Vital Signs: BP (!) 158/60   Pulse 78   Temp 98.1 F (36.7 C) (Oral)   Resp (!) 21   Ht 5\' 1"  (1.549 m)   Wt 95.5 kg   SpO2 98%   BMI 39.79 kg/m  Pain Scale: 0-10   Pain Score: 6    SpO2: SpO2: 98 % O2 Device:SpO2: 98 % O2 Flow Rate: .O2 Flow Rate (L/min): 4 L/min  IO: Intake/output summary:   Intake/Output Summary (Last 24 hours) at 10/02/2019 1325 Last data filed at 10/02/2019 0950 Gross per 24 hour  Intake 360 ml  Output 1507 ml  Net -1147 ml    LBM:   Baseline Weight: Weight: 99 kg Most recent weight: Weight: 95.5 kg     Palliative Assessment/Data:   Flowsheet Rows     Most Recent Value  Intake Tab  Referral Department  Hospitalist  Unit at Time of Referral  Cardiac/Telemetry Unit  Palliative Care Primary Diagnosis  Cardiac  Date Notified  10/01/19  Palliative Care Type  New Palliative care  Reason for referral  Clarify Goals of Care  Date of Admission  10/01/19  Date first seen by Palliative Care  10/02/19  # of days Palliative referral response time  1 Day(s)  # of days IP prior to Palliative referral  0  Clinical Assessment  Palliative Performance Scale Score  40%  Pain Max last 24 hours  Not able to report  Pain Min Last 24 hours  Not able to report  Dyspnea Max Last 24 Hours  Not able to report  Dyspnea Min Last 24 hours  Not able to report  Psychosocial & Spiritual Assessment  Palliative Care Outcomes      Time In: 1230 Time Out: 1340 Time Total: 70 minutes  Greater than 50%  of this time was spent counseling and coordinating care related to the above assessment and plan.  Signed by: Drue Novel, NP   Please contact Palliative Medicine Team phone at [phone] for questions and concerns.    For individual provider: See Shea Evans

## 2019-10-02 NOTE — Progress Notes (Signed)
Anticoagulation monitoring(Lovenox):  59 yo female ordered Lovenox 40 mg Q12h  Filed Weights   10/01/19 2116 10/02/19 0300 10/02/19 0500  Weight: 95.5 kg (210 lb 9.6 oz) 95.5 kg (210 lb 9.6 oz) 95.5 kg (210 lb 9.6 oz)   BMI 41.2   Lab Results  Component Value Date   CREATININE 0.82 10/02/2019   CREATININE 0.76 10/01/2019   CREATININE 0.78 09/26/2019   Estimated Creatinine Clearance: 79 mL/min (by C-G formula based on SCr of 0.82 mg/dL). Hemoglobin & Hematocrit     Component Value Date/Time   HGB 12.5 10/01/2019 1519   HGB 14.4 06/22/2012 0014   HCT 38.9 10/01/2019 1519   HCT 43.6 06/22/2012 0014     Per Protocol for Patient with estCrcl > 30 ml/min and BMI < 40, will transition to Lovenox 40 mg Q24h.     Pearla Dubonnet, PharmD Clinical Pharmacist 10/02/2019 3:04 PM

## 2019-10-02 NOTE — NC FL2 (Signed)
[place] LEVEL OF CARE SCREENING TOOL     IDENTIFICATION  Patient Name: Natalie Thomas Birthdate: 12/15/1960 Sex: female Admission Date (Current Location): 10/01/2019  [facility] and [place] Number:  Engineering geologist and Address:  [facility], [address] [place], [place] [address]      Provider Number: [id number]  Attending Physician Name and Address:  Fritzi Mandes, MD  Relative Name and Phone Number:       Current Level of Care: Hospital Recommended Level of Care: [place] Prior Approval Number:    Date Approved/Denied:   PASRR Number:    Discharge Plan: SNF    Current Diagnoses: Patient Active Problem List   Diagnosis Date Noted  . Acute on chronic respiratory failure ([place]) 10/01/2019  . Acute on chronic diastolic CHF (congestive heart failure) ([place])   . Shortness of breath 09/22/2019  . Acute on chronic respiratory failure with hypercapnia ([place]) 09/13/2019  . Leukocytosis 09/13/2019  . Obesity, Class III, BMI 40-49.9 (morbid obesity) ([place])   . Acute on chronic respiratory failure with hypoxemia ([place]) 09/04/2019  . COVID-19 virus infection   . 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 Nausea   . Acute gastritis   . Abdominal pain, chronic, epigastric   . Hyponatremia 07/09/2019  . Ventral hernia 07/09/2019  . Hypertensive crisis 05/01/2019  . Acute abdominal pain 03/15/2019  . Pleural effusion 03/13/2019  . Atelectasis 03/13/2019  . Acute on chronic combined systolic and diastolic CHF (congestive heart failure) ([place]) 03/13/2019  . Type 2 diabetes mellitus without complication ([place]) [id number]  . Urinary tract bacterial infections 03/09/2019  . Acute cystitis without hematuria   . Constipation   . Acute hypoxemic respiratory failure ([place]) 12/03/2018  . Irritability 11/09/2018  . Aggressive behavior 11/09/2018  . Agitation   . S/P right hip fracture 11/07/2017  . Closed comminuted intertrochanteric  fracture of right femur ([place]) 11/07/2017  . Anxiety and depression   . Dyspnea 10/16/2017  . Anemia 10/16/2017  . COPD exacerbation ([place]) 10/07/2017  . Hyperlipidemia 10/07/2017  . Acute on chronic respiratory failure with hypoxia and hypercapnia ([place]) 10/07/2017  . Incontinence of urine 08/14/2011  . Schizoaffective disorder, bipolar type ([place]) 07/05/2011  . Cough 05/08/2011  . Lumbar disc disease 04/09/2011  . Polycythemia secondary to smoking 04/09/2011  . HIP PAIN, LEFT 12/12/2007  . 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 DM (diabetes mellitus), type 2 ([place]) 04/08/2007  . MENOPAUSE, PREMATURE 04/08/2007  . Chronic hyponatremia 04/08/2007  . Schizoaffective disorder ([place]) 04/08/2007  . Essential hypertension 04/08/2007  . COPD (chronic obstructive pulmonary disease) ([place]) 04/08/2007  . GERD 04/08/2007  . Osteoporosis 04/08/2007    Orientation RESPIRATION BLADDER Height & Weight     Self, Situation  O2(4L) External catheter(4/8) Weight: 210 lb 9.6 oz (95.5 kg) Height:  5\' 1"  (154.9 cm)  BEHAVIORAL SYMPTOMS/MOOD NEUROLOGICAL BOWEL NUTRITION STATUS      Continent Diet(heart healthy, thin liquids)  AMBULATORY STATUS COMMUNICATION OF NEEDS Skin   Limited Assist Verbally Normal                       Personal Care Assistance Level of Assistance  Bathing, Feeding, Dressing Bathing Assistance: Limited assistance Feeding assistance: Independent Dressing Assistance: Limited assistance     Functional Limitations Info  Sight, Hearing, Speech Sight Info: Adequate Hearing Info: Adequate Speech Info: Adequate    SPECIAL CARE FACTORS FREQUENCY  PT (By licensed PT), OT (By licensed OT)  PT Frequency: 5x OT Frequency: 5x            Contractures Contractures Info: Not present    Additional Factors Info  Code Status, Allergies Code Status Info: Full [place] Allergies Info: Clarithromycin, Penicillins           Current Medications (10/02/2019):  This is the  current hospital active medication list Current Facility-Administered Medications  Medication Dose Route Frequency Provider Last Rate Last Admin  . acetaminophen (TYLENOL) tablet 650 mg  650 mg Oral Q6H PRN Fritzi Mandes, MD   650 mg at 10/02/19 1228   Or  . acetaminophen (TYLENOL) suppository 650 mg  650 mg Rectal Q6H PRN Fritzi Mandes, MD      . albuterol (PROVENTIL) (2.5 MG/3ML) 0.083% nebulizer solution 2.5 mg  2.5 mg Nebulization Q2H PRN Fritzi Mandes, MD      . ascorbic acid (VITAMIN C) tablet 1,000 mg  1,000 mg Oral Daily Fritzi Mandes, MD   1,000 mg at 10/02/19 0916  . buPROPion (WELLBUTRIN XL) 24 hr tablet 150 mg  150 mg Oral Daily Fritzi Mandes, MD   150 mg at 10/02/19 0917  . calcium-vitamin D (OSCAL WITH D) 500-200 MG-UNIT per tablet 1 tablet  1 tablet Oral Daily Fritzi Mandes, MD   1 tablet at 10/02/19 0916  . chlorhexidine (PERIDEX) 0.12 % solution 15 mL  15 mL Mouth Rinse BID Fritzi Mandes, MD   15 mL at 10/02/19 0917  . Chlorhexidine Gluconate Cloth 2 % PADS 6 each  6 each Topical Q0600 Fritzi Mandes, MD      . clonazePAM Bobbye [place]) tablet 1 mg  1 mg Oral BID Fritzi Mandes, MD   1 mg at 10/02/19 0916  . docusate sodium (COLACE) capsule 100 mg  100 mg Oral BID Fritzi Mandes, MD   100 mg at 10/02/19 0916  . enoxaparin (LOVENOX) injection 40 mg  40 mg Subcutaneous Q12H Fritzi Mandes, MD   40 mg at 10/02/19 0918  . feeding supplement (ENSURE ENLIVE) (ENSURE ENLIVE) liquid 237 mL  237 mL Oral Q24H Fritzi Mandes, MD      . fludrocortisone (FLORINEF) tablet 0.1 mg  0.1 mg Oral Daily Fritzi Mandes, MD   0.1 mg at 10/02/19 0919  . fluticasone furoate-vilanterol (BREO ELLIPTA) 100-25 MCG/INH 1 puff  1 puff Inhalation Daily Fritzi Mandes, MD   1 puff at 10/02/19 0920  . furosemide (LASIX) injection 40 mg  40 mg Intravenous Q12H Fritzi Mandes, MD   40 mg at 10/02/19 0916  . hydrOXYzine (ATARAX/VISTARIL) tablet 25 mg  25 mg Oral Q6H PRN Fritzi Mandes, MD   25 mg at 10/02/19 1228  . insulin aspart (novoLOG) injection 0-5  Units  0-5 Units Subcutaneous QHS Fritzi Mandes, MD      . insulin aspart (novoLOG) injection 0-9 Units  0-9 Units Subcutaneous TID WC Fritzi Mandes, MD      . ketorolac (TORADOL) 15 MG/ML injection 15 mg  15 mg Intravenous Q6H PRN Sharion Settler, NP   15 mg at 10/02/19 0944  . lurasidone (LATUDA) tablet 20 mg  20 mg Oral BID Fritzi Mandes, MD   20 mg at 10/02/19 0918  . MEDLINE mouth rinse  15 mL Mouth Rinse q12n4p Fritzi Mandes, MD   15 mL at 10/02/19 1231  . melatonin tablet 5 mg  5 mg Oral QHS Fritzi Mandes, MD   5 mg at 10/01/19 2228  . multivitamin with minerals tablet 1 tablet  1 tablet Oral Daily Fritzi Mandes, MD  1 tablet at 10/02/19 0916  . mupirocin ointment (BACTROBAN) 2 % 1 application  1 application Nasal BID Fritzi Mandes, MD   1 application at [id number] [phone]  . NIFEdipine (PROCARDIA-XL/NIFEDICAL-XL) 24 hr tablet 90 mg  90 mg Oral Daily Fritzi Mandes, MD   90 mg at 10/02/19 0919  . OLANZapine (ZYPREXA) tablet 10 mg  10 mg Oral BID Fritzi Mandes, MD   10 mg at 10/02/19 0919  . ondansetron (ZOFRAN) tablet 4 mg  4 mg Oral Q6H PRN Fritzi Mandes, MD       Or  . ondansetron [facility]) injection 4 mg  4 mg Intravenous Q6H PRN Fritzi Mandes, MD      . pantoprazole (PROTONIX) EC tablet 40 mg  40 mg Oral Daily Fritzi Mandes, MD   40 mg at 10/02/19 0916  . polyethylene glycol (MIRALAX / GLYCOLAX) packet 17 g  17 g Oral Daily PRN Fritzi Mandes, MD      . polyethylene glycol (MIRALAX / GLYCOLAX) packet 17 g  17 g Oral Daily Fritzi Mandes, MD   17 g at 10/02/19 0917  . senna (SENOKOT) tablet 8.6 mg  1 tablet Oral BID Fritzi Mandes, MD   8.6 mg at 10/02/19 0916  . spironolactone (ALDACTONE) tablet 25 mg  25 mg Oral Daily Fritzi Mandes, MD   25 mg at 10/02/19 0917  . sucralfate (CARAFATE) 1 GM/10ML suspension 1 g  1 g Oral TID WC & HS Fritzi Mandes, MD      . valproic acid (DEPAKENE) 250 MG capsule 500 mg  500 mg Oral TID Fritzi Mandes, MD   500 mg at 10/02/19 0919     Discharge Medications: Please see discharge summary for a  list of discharge medications.  Relevant Imaging Results:  Relevant Lab Results:   Additional Information [id number]  Eileen Stanford, LCSW

## 2019-10-02 NOTE — Progress Notes (Addendum)
Patient unable or unwilling to keep CPAP maks in place. Complains of mask being too tight and also that it blocks her nose. RT has been in to assess; nasal cannula has been replaced at 4 liters and O2 sat. Patient yelling out, exhibits paranoid behavior, despite having anxiolytics earlier . B. Randol Kern made aware and in to assess patient. Patient has been placed on continuous O2 sat and MX2 monitor.   Patient given Ativan 1 mg IVP at 0500. Will CTM. Received notification that patient is MRSA positive; CHG mouth rinse given and mupiricin ointment given.

## 2019-10-02 NOTE — Progress Notes (Signed)
Patient was calling out complaining of severe pain and anxiety, multiple times. Had been given Tylenol without  Relief, as well as Klonepin and antipsychotics as scheduled. Discussed with BRandol Kern. Patient was given Toradol and Xanax and also given snack.   Verbalized feeling a little better, will CTM

## 2019-10-02 NOTE — Progress Notes (Signed)
[place] at [place] NAME: Natalie Thomas    MR#:  [id number]  DATE OF BIRTH:  19-Mar-1961  SUBJECTIVE:   Patient quite anxious. Tells me she cannot breathe. Her sats are 97% on 4L (home) she had some issues with her CPAP mask. Received Ativan last night ordered by nurse practitioner REVIEW OF SYSTEMS:   Review of Systems  Constitutional: Negative for chills, fever and weight loss.  HENT: Negative for ear discharge, ear pain and nosebleeds.   Eyes: Negative for blurred vision, pain and discharge.  Respiratory: Positive for shortness of breath. Negative for sputum production, wheezing and stridor.   Cardiovascular: Negative for chest pain, palpitations, orthopnea and PND.  Gastrointestinal: Negative for abdominal pain, diarrhea, nausea and vomiting.  Genitourinary: Negative for frequency and urgency.  Musculoskeletal: Negative for back pain and joint pain.  Neurological: Positive for weakness. Negative for sensory change, speech change and focal weakness.  Psychiatric/Behavioral: Negative for depression and hallucinations. The patient is nervous/anxious.    Tolerating Diet:yes Tolerating PT:   DRUG ALLERGIES:   Allergies  Allergen Reactions  . Clarithromycin Itching  . Penicillins Itching    Has tolerated cefazolin before  Has patient had a PCN reaction causing immediate rash, facial/tongue/throat swelling, SOB or lightheadedness with hypotension: No Has patient had a PCN reaction causing severe rash involving mucus membranes or skin necrosis: No Has patient had a PCN reaction that required hospitalization: Unknown Has patient had a PCN reaction occurring within the last 10 years: No If all of the above answers are "NO", then may proceed with Cephalosporin use.     VITALS:  Blood pressure (!) 185/167, pulse 73, temperature 98.1 F (36.7 C), temperature source Oral, resp. rate 17, height 5\' 1"  (1.549 m), weight 95.5 kg, SpO2 97  %.  PHYSICAL EXAMINATION:   Physical Exam  GENERAL:  59 y.o.-year-old patient lying in the bed with no acute distress. obese EYES: Pupils equal, round, reactive to light and accommodation. No scleral icterus.   HEENT: Head atraumatic, normocephalic. Oropharynx and nasopharynx clear.  NECK:  Supple, no jugular venous distention. No thyroid enlargement, no tenderness.  LUNGS: decreased breath sounds bilaterally, no wheezing, rales, rhonchi. No use of accessory muscles of respiration. Shallow breath sounds CARDIOVASCULAR: S1, S2 normal. No murmurs, rubs, or gallops.  ABDOMEN: Soft, nontender, nondistended. Bowel sounds present. No organomegaly or mass.  EXTREMITIES: No cyanosis, clubbing or edema b/l.    NEUROLOGIC: no focal motor deficit. Moves all extremities well. Generalized weakness and deconditioned  PSYCHIATRIC:  patient is alert and oriented x 2. Anxious+  SKIN: No obvious rash, lesion, or ulcer.  .pres LABORATORY PANEL:  CBC Recent Labs  Lab 10/01/19 1519  WBC 6.7  HGB 12.5  HCT 38.9  PLT 155    Chemistries  Recent Labs  Lab 10/01/19 1519 10/01/19 1519 10/02/19 0438  NA 132*   < > 132*  K 4.6   < > 3.4*  CL 93*   < > 91*  CO2 31   < > 32  GLUCOSE 131*   < > 135*  BUN 19   < > 21*  CREATININE 0.76   < > 0.82  CALCIUM 9.2   < > 8.7*  AST 13*  --   --   ALT 12  --   --   ALKPHOS 59  --   --   BILITOT 0.5  --   --    < > = values in  this interval not displayed.   Cardiac Enzymes No results for input(s): TROPONINI in the last 168 hours. RADIOLOGY:  DG Chest Port 1 View  Result Date: 10/01/2019 CLINICAL DATA:  Shortness of breath and chest tightness. EXAM: PORTABLE CHEST 1 VIEW COMPARISON:  09/23/2019 FINDINGS: 1554 hours. The cardio pericardial silhouette is enlarged. There is pulmonary vascular congestion without overt pulmonary edema. Likely associated interstitial edema. Bibasilar atelectasis/infiltrate with small bilateral pleural effusions. Bones are  diffusely demineralized. Telemetry leads overlie the chest. IMPRESSION: Cardiomegaly with vascular congestion and likely interstitial edema. Basilar atelectasis/infiltrate with small bilateral effusions. Electronically Signed   By: Misty Stanley M.D.   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 Patient apparently has been drinking a lot of water according to the staff at the facility Patient herself reports craving for ice water.  Acute on chronic hypoxic/ hypercarbic Respiratory failure secondary to congestive heart failure acute on chronic diastolic with excess water intake with history of secondary polydipsia -patient presented with increasing shortness of breath with increased oxygen requirement. -She is currently on CPAP prn. Sats are 97%. She is awake and alert -IV Lasix 40 mg BID--good UOP -low sodium diet with fluid restriction 2 L -PRN inhalers -wean to nasal cannula oxygen sats remain greater than 92% -she advised on fluid restriction. -Currently sats 97% on 4 L (chronic)  Type II diabetes-- not on any meds -sugars stable -sliding scale insulin  Hypertension -continue home meds  Schizoaffective disorder will continue Klonopin, Zyprexa, melatonin and Latuda -patient has anxiety spells. She has been getting Ativan intermittent in the evening ordered by nurse practitioner. -Requested psychiatry consultation. Informed about it. -I am not comfortable giving benzodiazepines given respiratory status and patient being on multiple psych meds  history of chronic hyponatremia -patient  apparently is noted to sneak ice water at the facility -she has history of psychogenic polydipsia -on  water restriction will continue the same  Morbid obesity  Multiple admissions for similar problems-- palliative care consultation appreciated-- they spoke with patient's legal guardian [name]. - Patient is DNR according to palliative care. Paperwork will be signed and faxed back to her legal guardian.  Family Communication : Pt has legal guardian--left message of for Ms [name] Consults :Psych Code Status :DNR per NP--who has d/w legal guardian. DVT prophylaxis :lovenox   TOTAL TIME TAKING CARE OF THIS PATIENT: *30* minutes.  >50% time spent on counselling and coordination of care  Note: This dictation was prepared with Dragon dictation along with smaller phrase technology. Any transcriptional errors that result from this process are unintentional.  Fritzi Mandes M.D    [facility]   CC: Primary care physician; System, Pcp Not InPatient ID: Natalie Thomas, female   DOB: 07/10/1960, 59 y.o.   MRN: [id number]

## 2019-10-03 LAB — GLUCOSE, CAPILLARY
Glucose-Capillary: 116 mg/dL — ABNORMAL HIGH (ref 70–99)
Glucose-Capillary: 107 mg/dL — ABNORMAL HIGH (ref 70–99)
Glucose-Capillary: 111 mg/dL — ABNORMAL HIGH (ref 70–99)
Glucose-Capillary: 115 mg/dL — ABNORMAL HIGH (ref 70–99)

## 2019-10-03 MED ORDER — MAGNESIUM HYDROXIDE 400 MG/5ML PO SUSP
30.0000 mL | Freq: Every day | ORAL | Status: DC | PRN
  Administered 2019-10-03: 30 mL via ORAL
  Filled 2019-10-03: qty 30

## 2019-10-03 NOTE — Progress Notes (Signed)
[place] at [place] NAME: Natalie Thomas    MR#:  [id number]  DATE OF BIRTH:  [birth year]  SUBJECTIVE:   Patient quite anxious. Tells me she cannot breathe. Her sats are 97% on 4L (home) Eating well REVIEW OF SYSTEMS:   Review of Systems  Constitutional: Negative for chills, fever and weight loss.  HENT: Negative for ear discharge, ear pain and nosebleeds.   Eyes: Negative for blurred vision, pain and discharge.  Respiratory: Positive for shortness of breath. Negative for sputum production, wheezing and stridor.   Cardiovascular: Negative for chest pain, palpitations, orthopnea and PND.  Gastrointestinal: Negative for abdominal pain, diarrhea, nausea and vomiting.  Genitourinary: Negative for frequency and urgency.  Musculoskeletal: Negative for back pain and joint pain.  Neurological: Positive for weakness. Negative for sensory change, speech change and focal weakness.  Psychiatric/Behavioral: Negative for depression and hallucinations. The patient is nervous/anxious.    Tolerating Diet:yes Tolerating PT: SNF  DRUG ALLERGIES:   Allergies  Allergen Reactions  . Clarithromycin Itching  . Penicillins Itching    Has tolerated cefazolin before  Has patient had a PCN reaction causing immediate rash, facial/tongue/throat swelling, SOB or lightheadedness with hypotension: No Has patient had a PCN reaction causing severe rash involving mucus membranes or skin necrosis: No Has patient had a PCN reaction that required hospitalization: Unknown Has patient had a PCN reaction occurring within the last 10 years: No If all of the above answers are "NO", then may proceed with Cephalosporin use.     VITALS:  Blood pressure (!) 165/84, pulse 80, temperature 98.3 F (36.8 C), temperature source Oral, resp. rate 19, height 5\' 1"  (1.549 m), weight 95.5 kg, SpO2 97 %.  PHYSICAL EXAMINATION:   Physical Exam  GENERAL:  59 y.o.-year-old patient  lying in the bed with no acute distress. obese EYES: Pupils equal, round, reactive to light and accommodation. No scleral icterus.   HEENT: Head atraumatic, normocephalic. Oropharynx and nasopharynx clear.  NECK:  Supple, no jugular venous distention. No thyroid enlargement, no tenderness.  LUNGS: decreased breath sounds bilaterally, no wheezing, rales, rhonchi. No use of accessory muscles of respiration. Shallow breath sounds CARDIOVASCULAR: S1, S2 normal. No murmurs, rubs, or gallops.  ABDOMEN: Soft, nontender, nondistended. Bowel sounds present. No organomegaly or mass.  EXTREMITIES: No cyanosis, clubbing or edema b/l.    NEUROLOGIC: no focal motor deficit. Moves all extremities well. Generalized weakness and deconditioned  PSYCHIATRIC:  patient is alert and oriented x 2. Anxious+  SKIN: No obvious rash, lesion, or ulcer.  .pres LABORATORY PANEL:  CBC Recent Labs  Lab 10/01/19 1519  WBC 6.7  HGB 12.5  HCT 38.9  PLT 155    Chemistries  Recent Labs  Lab 10/01/19 1519 10/01/19 1519 10/02/19 0438  NA 132*   < > 132*  K 4.6   < > 3.4*  CL 93*   < > 91*  CO2 31   < > 32  GLUCOSE 131*   < > 135*  BUN 19   < > 21*  CREATININE 0.76   < > 0.82  CALCIUM 9.2   < > 8.7*  AST 13*  --   --   ALT 12  --   --   ALKPHOS 59  --   --   BILITOT 0.5  --   --    < > = values in this interval not displayed.   Cardiac Enzymes No results for input(s): TROPONINI in  the last 168 hours. RADIOLOGY:  No results found. 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 Patient apparently has been drinking a lot of water  according to the staff at the facility Patient herself reports craving for ice water.  Acute on chronic hypoxic/ hypercarbic Respiratory failure secondary to congestive heart failure acute on chronic diastolic with excess water intake with history of secondary polydipsia -patient presented with increasing shortness of breath with increased oxygen requirement. -She is currently on CPAP prn. Sats are 97%. She is awake and alert -IV Lasix 40 mg BID--good UOP -low sodium diet with fluid restriction 2 L -PRN inhalers -wean to nasal cannula oxygen sats remain greater than 92% -she advised on fluid restriction. -Currently sats 97% on 4 L (chronic)  Type II diabetes-- not on any meds -sugars stable -sliding scale insulin  Hypertension -continue home meds  Schizoaffective disorder will continue Klonopin, Zyprexa, melatonin and Latuda -patient has anxiety spells.  -Requested psychiatry consultation.recommends cont same meds -I am not comfortable giving benzodiazepines given respiratory status and patient being on multiple psych meds  history of chronic hyponatremia -patient apparently is noted to sneak ice water at the facility -she has history of psychogenic polydipsia -on  water restriction will continue the same  Morbid obesity  Multiple admissions for similar problems-- palliative care consultation appreciated-- they spoke with patient's legal guardian [name]. - Patient is DNR according to palliative care once  Paperwork is signed and faxed back to her legal guardian on Monday--till then keep her Full code  Family Communication : Pt has legal guardian--left message of for Ms [name] Consults :Psych Code Status :Full code for now  DVT prophylaxis :lovenox Discharge disposition: to Rehab on Monday if remains stable TOC informed   TOTAL TIME TAKING CARE OF THIS PATIENT: *30* minutes.  >50% time spent on counselling and coordination of care  Note: This dictation  was prepared with Dragon dictation along with smaller phrase technology. Any transcriptional errors that result from this process are unintentional.  Fritzi Mandes M.D    [facility]   CC: Primary care physician; System, Pcp Not InPatient ID: Natalie Thomas, female   DOB: June 11, 1961, 59 y.o.   MRN: [id number]

## 2019-10-03 NOTE — Progress Notes (Signed)
Spoke to patient's aunt, Natalie Thomas by phone last night. She had called with concerns because patient had called her multiple times, and that her current mental state is not her baseline. She reported that patient has no family other than herself and that patient is a "ward of the state".  She had previously cared for her but is no longer able to care for her.   Ms. Natalie Thomas contact number is [phone] and requests that a provider contact her to discuss her concerns

## 2019-10-03 NOTE — Consult Note (Signed)
Consulted with Dr. Weber Cooks and also talked to Dr. Posey Pronto.  After reviewing patient's chart and discussion with Dr. Posey Pronto felt that continuing the patient on her current home medications of Klonopin would be best indicated for her anxiety.  Patient is on various psychiatric medications already and feel that this would be the best option to manage her anxiety better.  Dr. Posey Pronto was in agreement with this.

## 2019-10-03 NOTE — Evaluation (Signed)
Physical Therapy Evaluation Patient Details Name: Natalie Thomas MRN: [id number] DOB: Nov 18, 1960 Today's Date: 10/03/2019   History of Present Illness  Patient is a 59 year old female with known history of anxiety, chronic depression, schizoaffective disorder, COPD on home oxygen, type II diabetes, hypertension, morbid obesity comes from [facility] with shortness of breath respiratory distress and hypoxia. Per EMS staff noticed increased work of breathing started today. She is normally on 4 L nasal cannula oxygen sats dropped out in the mid-70s. She was placed on not rebreather with improvement to 80s  Clinical Impression  Patient received in bed, anxious, tangential with speech, thoughts. Patient is agreeable to sit up and try to walk. Patient is somewhat paranoid during session, thinks the Oxygen is not working, she is not clean, they are not giving her the right medications, etc. She is able to perform bed mobility with min assist. Performs transfers with min guard and is able to take a few steps with min guard and rolling walker. She is limited this session by shortness of breath and anxiety. Patient will continue to benefit from skilled PT while here to improve strength and functional independence.     Follow Up Recommendations SNF;Supervision for mobility/OOB    Equipment Recommendations  None recommended by PT(to be determined next venue)    Recommendations for Other Services       Precautions / Restrictions Precautions Precautions: Fall Restrictions Weight Bearing Restrictions: No      Mobility  Bed Mobility Overal bed mobility: Needs Assistance Bed Mobility: Supine to Sit;Sit to Supine     Supine to sit: Min assist Sit to supine: Min assist   General bed mobility comments: requires assistance to get fully upright and positioned on side of bed, needs assist to bring LEs back up onto bed.  Transfers Overall transfer level: Needs assistance Equipment used:  Rolling walker (2 wheeled)   Sit to Stand: Min guard            Ambulation/Gait Ambulation/Gait assistance: Min guard Gait Distance (Feet): 10 Feet Assistive device: Rolling walker (2 wheeled) Gait Pattern/deviations: Step-to pattern     General Gait Details: patient able to take a few steps near bed x 2. Patient unwilling to allow [place] to get tank or O2 line extension to walk further. Also sob wtih limited mobility. O2 saturations remained > 90% throughout on 4 lpm.  Stairs            Wheelchair Mobility    Modified Rankin (Stroke Patients Only)       Balance Overall balance assessment: Needs assistance Sitting-balance support: Feet supported;Single extremity supported Sitting balance-Leahy Scale: Good     Standing balance support: Bilateral upper extremity supported;During functional activity Standing balance-Leahy Scale: Fair                               Pertinent Vitals/Pain Pain Assessment: Faces Faces Pain Scale: Hurts little more Pain Location: back, abdomen Pain Descriptors / Indicators: Discomfort;Grimacing Pain Intervention(s): Monitored during session    Home Living Family/patient expects to be discharged to:: Skilled nursing facility                 Additional Comments: Resident of [facility]    Prior Function Level of Independence: Needs assistance   Gait / Transfers Assistance Needed: Pt reports using w/c mostly (propels with UE's/LE's on own) but also walks short distances with RW and assist  ADL's / Homemaking Assistance Needed: Pt reports needing assist with her care  Comments: pt is reluctant to answer some questions     Hand Dominance   Dominant Hand: Right    Extremity/Trunk Assessment   Upper Extremity Assessment Upper Extremity Assessment: Generalized weakness    Lower Extremity Assessment Lower Extremity Assessment: Generalized weakness    Cervical / Trunk Assessment Cervical /  Trunk Assessment: Normal  Communication   Communication: No difficulties  Cognition Arousal/Alertness: Awake/alert Behavior During Therapy: Anxious Overall Cognitive Status: No family/caregiver present to determine baseline cognitive functioning                                 General Comments: tangential thoughts and speech. Focused on Oxygen, pain, sob      General Comments      Exercises     Assessment/Plan    PT Assessment Patient needs continued PT services  PT Problem List Decreased strength;Decreased mobility;Decreased safety awareness;Decreased activity tolerance;Decreased cognition;Cardiopulmonary status limiting activity       PT Treatment Interventions DME instruction;Gait training;Functional mobility training;Therapeutic activities;Therapeutic exercise;Balance training;Patient/family education    PT Goals (Current goals can be found in the Care Plan section)  Acute Rehab PT Goals Patient Stated Goal: "I need my meds" PT Goal Formulation: With patient Time For Goal Achievement: 10/17/19 Potential to Achieve Goals: Fair    Frequency Min 2X/week   Barriers to discharge        Co-evaluation               AM-PAC PT "6 Clicks" Mobility  Outcome Measure Help needed turning from your back to your side while in a flat bed without using bedrails?: A Little Help needed moving from lying on your back to sitting on the side of a flat bed without using bedrails?: A Little Help needed moving to and from a bed to a chair (including a wheelchair)?: A Little Help needed standing up from a chair using your arms (e.g., wheelchair or bedside chair)?: A Little Help needed to walk in hospital room?: A Lot Help needed climbing 3-5 steps with a railing? : A Lot 6 Click Score: 16    End of Session Equipment Utilized During Treatment: Oxygen Activity Tolerance: Other (comment)(limited by SOB, Anxiety)   Nurse Communication: Mobility status PT Visit  Diagnosis: Difficulty in walking, not elsewhere classified (R26.2);Muscle weakness (generalized) (M62.81)    Time: [id number] PT Time Calculation (min) (ACUTE ONLY): 26 min   Charges:   PT Evaluation $PT Eval Moderate Complexity: 1 Mod PT Treatments $Gait Training: 8-22 mins        Jaray Boliver, PT, GCS 10/03/19,3:30 PM

## 2019-10-04 LAB — BASIC METABOLIC PANEL
Anion gap: 11 (ref 5–15)
BUN: 28 mg/dL — ABNORMAL HIGH (ref 6–20)
CO2: 32 mmol/L (ref 22–32)
Calcium: 8.7 mg/dL — ABNORMAL LOW (ref 8.9–10.3)
Chloride: 86 mmol/L — ABNORMAL LOW (ref 98–111)
Creatinine, Ser: 0.68 mg/dL (ref 0.44–1.00)
GFR calc Af Amer: >60 mL/min (ref >60)
GFR calc non Af Amer: >60 mL/min (ref >60)
Glucose, Bld: 96 mg/dL (ref 70–99)
Potassium: 4.6 mmol/L (ref 3.5–5.1)
Sodium: 129 mmol/L — ABNORMAL LOW (ref 135–145)

## 2019-10-04 LAB — GLUCOSE, CAPILLARY
Glucose-Capillary: 108 mg/dL — ABNORMAL HIGH (ref 70–99)
Glucose-Capillary: 117 mg/dL — ABNORMAL HIGH (ref 70–99)
Glucose-Capillary: 91 mg/dL (ref 70–99)
Glucose-Capillary: 120 mg/dL — ABNORMAL HIGH (ref 70–99)

## 2019-10-04 MED ORDER — FUROSEMIDE 40 MG PO TABS
40.0000 mg | ORAL_TABLET | Freq: Every day | ORAL | Status: DC
  Administered 2019-10-05: 40 mg via ORAL
  Filled 2019-10-04: qty 1

## 2019-10-04 NOTE — Progress Notes (Signed)
Patient was placed on cpap earlier. Wore for appx 15 min then requested to come off.

## 2019-10-04 NOTE — Progress Notes (Signed)
ReDS Pro not appropriate for this patient due to BMI >28.

## 2019-10-04 NOTE — Progress Notes (Signed)
Patient is awake eating multiple things at this time. Remains on nasal o2 in no distress. cpap is available on standby. Touched basis with RN.  Patient has not been complaint with using cpap. Will continue to monitor.

## 2019-10-04 NOTE — TOC Initial Note (Signed)
Transition of Care [facility]) - Initial/Assessment Note    Patient Details  Name: Natalie Thomas MRN: [id number] Date of Birth: [birth year]  Transition of Care [facility]) CM/SW Contact:    Eileen Stanford, LCSW Phone Number: 10/04/2019, 1:17 PM  Clinical Narrative: CSW spoke with pt's legal guardian, [name]. [name] confirmed pt is a long term resident at [facility] and the plan is for pt to return at d/c. CSW will follow up with facility regarding d/c.                  Expected Discharge Plan: Skilled Nursing Facility Barriers to Discharge: Continued Medical Work up   Patient Goals and CMS Choice Patient states their goals for this hospitalization and ongoing recovery are:: to go back to [facility]- per Legal Guardian      Expected Discharge Plan and Services Expected Discharge Plan: [place] In-house Referral: Clinical Social Work   Post Acute Care Choice: [place] Living arrangements for the past 2 months: [place]                                      Prior Living Arrangements/Services Living arrangements for the past 2 months: [place] Lives with:: Self Patient language and need for interpreter reviewed:: Yes Do you feel safe going back to the place where you live?: Yes      Need for Family Participation in Patient Care: Yes (Comment) Care giver support system in place?: Yes (comment)   Criminal Activity/Legal Involvement Pertinent to Current Situation/Hospitalization: No - Comment as needed  Activities of Daily Living Home Assistive Devices/Equipment: Oxygen, Walker (specify type), Wheelchair ADL Screening (condition at time of admission) Patient's cognitive ability adequate to safely complete daily activities?: Yes Is the patient deaf or have difficulty hearing?: No Does the patient have difficulty seeing, even when wearing glasses/contacts?: No Does the patient have difficulty concentrating,  remembering, or making decisions?: No Patient able to express need for assistance with ADLs?: Yes Does the patient have difficulty dressing or bathing?: Yes Independently performs ADLs?: Yes (appropriate for developmental age) Communication: Independent Does the patient have difficulty walking or climbing stairs?: Yes Weakness of Legs: Both Weakness of Arms/Hands: Both  Permission Sought/Granted Permission sought to share information with : Guardian    Share Information with NAME: [name]  Permission granted to share info w AGENCY: [place] granted to share info w Relationship: Legal Guardian     Emotional Assessment Appearance:: Appears stated age Attitude/Demeanor/Rapport: Unable to Assess Affect (typically observed): Unable to Assess Orientation: : Oriented to Self, Oriented to Place, Oriented to  Time Alcohol / Substance Use: Not Applicable Psych Involvement: No (comment)  Admission diagnosis:  SOB (shortness of breath) [R06.02] Hypoxia [R09.02] Acute on chronic respiratory failure (HCC) [J96.20] Patient Active Problem List   Diagnosis Date Noted  . DNR (do not resuscitate) discussion   . Goals of care, counseling/discussion   . Palliative care by specialist   . Acute on chronic respiratory failure ([place]) 10/01/2019  . Acute on chronic diastolic CHF (congestive heart failure) ([place])   . Shortness of breath 09/22/2019  . Acute on chronic respiratory failure with hypercapnia ([place]) 09/13/2019  . Leukocytosis 09/13/2019  . Class 2 obesity with body mass index (BMI) of 39.0 to 39.9 in adult   . Acute on chronic respiratory failure with hypoxemia ([place]) 09/04/2019  .  COVID-19 virus infection   . 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 Nausea   . Acute gastritis   . Abdominal pain, chronic, epigastric   . Hyponatremia 07/09/2019  . Ventral hernia 07/09/2019  . Hypertensive crisis 05/01/2019  . Acute abdominal pain 03/15/2019  . Pleural effusion  03/13/2019  . Atelectasis 03/13/2019  . Acute on chronic combined systolic and diastolic CHF (congestive heart failure) ([place]) 03/13/2019  . Type 2 diabetes mellitus without complication ([place]) [id number]  . Urinary tract bacterial infections 03/09/2019  . Acute cystitis without hematuria   . Constipation   . Acute hypoxemic respiratory failure ([place]) 12/03/2018  . Irritability 11/09/2018  . Aggressive behavior 11/09/2018  . Agitation   . S/P right hip fracture 11/07/2017  . Closed comminuted intertrochanteric fracture of right femur ([place]) 11/07/2017  . Anxiety and depression   . Dyspnea 10/16/2017  . Anemia 10/16/2017  . COPD exacerbation ([place]) 10/07/2017  . Hyperlipidemia 10/07/2017  . Acute on chronic respiratory failure with hypoxia and hypercapnia ([place]) 10/07/2017  . Incontinence of urine 08/14/2011  . Schizoaffective disorder, bipolar type ([place]) 07/05/2011  . Cough 05/08/2011  . Lumbar disc disease 04/09/2011  . Polycythemia secondary to smoking 04/09/2011  . HIP PAIN, LEFT 12/12/2007  . 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 DM (diabetes mellitus), type 2 ([place]) 04/08/2007  . MENOPAUSE, PREMATURE 04/08/2007  . Chronic hyponatremia 04/08/2007  . Schizoaffective disorder ([place]) 04/08/2007  . Essential hypertension 04/08/2007  . COPD (chronic obstructive pulmonary disease) ([place]) 04/08/2007  . GERD 04/08/2007  . Osteoporosis 04/08/2007   PCP:  System, Pcp Not In Pharmacy:   [place] [place] [address] Phone: [phone] Fax: [phone]     Social Determinants of Health (SDOH) Interventions    Readmission Risk Interventions Readmission Risk Prevention Plan 09/15/2019 09/08/2019 07/10/2019  Transportation Screening Complete Complete Complete  Medication Review Press photographer) Complete Complete Referral to Pharmacy  PCP or Specialist appointment within 3-5 days of discharge Complete Complete Complete  HRI  or Home Care Consult Complete Complete Not Complete  HRI or Home Care Consult Pt Refusal Comments - - pt is resident of [facility]  SW Recovery Care/Counseling Consult - Complete Complete  Palliative Care Screening - Not Applicable Not [place] Complete Complete Not Applicable  Some recent data might be hidden

## 2019-10-04 NOTE — Progress Notes (Signed)
Natalie Thomas at [place] NAME: Natalie Thomas    MR#:  [id number]  DATE OF BIRTH:  10-Jan-1961  SUBJECTIVE:   Patient remains with anxious. Tells me she cannot breathe. Her sats are 97% on 4L (home) Eating well No respiraotry distress. Pt gets irritable easily REVIEW OF SYSTEMS:   Review of Systems  Constitutional: Negative for chills, fever and weight loss.  HENT: Negative for ear discharge, ear pain and nosebleeds.   Eyes: Negative for blurred vision, pain and discharge.  Respiratory: Positive for shortness of breath. Negative for sputum production, wheezing and stridor.   Cardiovascular: Negative for chest pain, palpitations, orthopnea and PND.  Gastrointestinal: Negative for abdominal pain, diarrhea, nausea and vomiting.  Genitourinary: Negative for frequency and urgency.  Musculoskeletal: Negative for back pain and joint pain.  Neurological: Positive for weakness. Negative for sensory change, speech change and focal weakness.  Psychiatric/Behavioral: Negative for depression and hallucinations. The patient is nervous/anxious.    Tolerating Diet:yes Tolerating PT: SNF  DRUG ALLERGIES:   Allergies  Allergen Reactions  . Clarithromycin Itching  . Penicillins Itching    Has tolerated cefazolin before  Has patient had a PCN reaction causing immediate rash, facial/tongue/throat swelling, SOB or lightheadedness with hypotension: No Has patient had a PCN reaction causing severe rash involving mucus membranes or skin necrosis: No Has patient had a PCN reaction that required hospitalization: Unknown Has patient had a PCN reaction occurring within the last 10 years: No If all of the above answers are "NO", then may proceed with Cephalosporin use.     VITALS:  Blood pressure 108/74, pulse 74, temperature 98 F (36.7 C), resp. rate 20, height 5\' 1"  (1.549 m), weight 97.8 kg, SpO2 98 %.  PHYSICAL EXAMINATION:   Physical Exam  GENERAL:   59 y.o.-year-old patient lying in the bed with no acute distress. obese EYES: Pupils equal, round, reactive to light and accommodation. No scleral icterus.   HEENT: Head atraumatic, normocephalic. Oropharynx and nasopharynx clear.  NECK:  Supple, no jugular venous distention. No thyroid enlargement, no tenderness.  LUNGS: decreased breath sounds bilaterally, no wheezing, rales, rhonchi. No use of accessory muscles of respiration. Shallow breath sounds CARDIOVASCULAR: S1, S2 normal. No murmurs, rubs, or gallops.  ABDOMEN: Soft, nontender, nondistended. Bowel sounds present. No organomegaly or mass.  EXTREMITIES: No cyanosis, clubbing or edema b/l.    NEUROLOGIC: no focal motor deficit. Moves all extremities well. Generalized weakness and deconditioned  PSYCHIATRIC:  patient is alert and oriented x 2. Anxious+  SKIN: No obvious rash, lesion, or ulcer.  .pres LABORATORY PANEL:  CBC Recent Labs  Lab 10/01/19 1519  WBC 6.7  HGB 12.5  HCT 38.9  PLT 155    Chemistries  Recent Labs  Lab 10/01/19 1519 10/02/19 0438 10/04/19 0912  NA 132*   < > 129*  K 4.6   < > 4.6  CL 93*   < > 86*  CO2 31   < > 32  GLUCOSE 131*   < > 96  BUN 19   < > 28*  CREATININE 0.76   < > 0.68  CALCIUM 9.2   < > 8.7*  AST 13*  --   --   ALT 12  --   --   ALKPHOS 59  --   --   BILITOT 0.5  --   --    < > = values in this interval not displayed.   Cardiac Enzymes No results  for input(s): TROPONINI in the last 168 hours. RADIOLOGY:  No results found. 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 Patient apparently has been drinking a  lot of water according to the staff at the facility Patient herself reports craving for ice water.  Acute on chronic hypoxic/ hypercarbic Respiratory failure secondary to congestive heart failure acute on chronic diastolic with excess water intake with history of secondary polydipsia -patient presented with increasing shortness of breath with increased oxygen requirement. -She is currently on CPAP prn. Sats are 97%. She is awake and alert -IV Lasix 40 mg BID--good UOP >8 liters so far--change to lasix 40 mg qd -low sodium diet with fluid restriction 2 L -PRN inhalers -wean to nasal cannula oxygen sats remain greater than 92% -she advised on fluid restriction. -Currently sats 97% on 4 L (chronic)  Type II diabetes-- not on any meds -sugars stable -sliding scale insulin  Hypertension -continue home meds  Schizoaffective disorder will continue Klonopin, Zyprexa, melatonin and Latuda -patient has anxiety spells.  -Requested psychiatry consultation.recommends cont same meds. -I am not comfortable giving benzodiazepines given respiratory status and patient being on multiple psych meds  history of chronic hyponatremia -patient apparently is noted to sneak ice water at the facility -she has history of psychogenic polydipsia -on  water restriction will continue the same  Morbid obesity  Multiple admissions for similar problems-- palliative care consultation appreciated-- they spoke with patient's legal guardian Natalie Thomas. - Patient is DNR according to palliative care once  Paperwork is signed and faxed back to her legal guardian on Monday--till then keep her Full code  Pt is at a very High risk of readmission due to her chronic medical problems and her issues with anxiety around not able to breathe  Family Communication : Pt has legal guardian--left message of for Ms [name] [facility] care has spoken with legal guardian Consults :Psych Code Status :Full code for now   DVT prophylaxis :lovenox Discharge disposition: to Rehab on Monday if remains stable TOC informed   TOTAL TIME TAKING CARE OF THIS PATIENT: *30* minutes.  >50% time spent on counselling and coordination of care  Note: This dictation was prepared with Dragon dictation along with smaller phrase technology. Any transcriptional errors that result from this process are unintentional.  Fritzi Mandes M.D    [facility]   CC: Primary care physician; System, Pcp Not InPatient ID: SEILA FORTMAN, female   DOB: 09/25/1960, 59 y.o.   MRN: [id number]

## 2019-10-04 NOTE — Plan of Care (Signed)
Pt agitated and irritate intermittently throughout shift.  VSS. NAD noted.    Problem: Education: Goal: Knowledge of General Education information will improve Description: Including pain rating scale, medication(s)/side effects and non-pharmacologic comfort measures Outcome: Progressing   Problem: Health Behavior/Discharge Planning: Goal: Ability to manage health-related needs will improve Outcome: Progressing   Problem: Clinical Measurements: Goal: Ability to maintain clinical measurements within normal limits will improve Outcome: Progressing Goal: Will remain free from infection Outcome: Progressing Goal: Diagnostic test results will improve Outcome: Progressing Goal: Respiratory complications will improve Outcome: Progressing Goal: Cardiovascular complication will be avoided Outcome: Progressing   Problem: Activity: Goal: Risk for activity intolerance will decrease Outcome: Progressing   Problem: Nutrition: Goal: Adequate nutrition will be maintained Outcome: Progressing   Problem: Coping: Goal: Level of anxiety will decrease Outcome: Progressing   Problem: Elimination: Goal: Will not experience complications related to bowel motility Outcome: Progressing Goal: Will not experience complications related to urinary retention Outcome: Progressing   Problem: Pain Managment: Goal: General experience of comfort will improve Outcome: Progressing   Problem: Safety: Goal: Ability to remain free from injury will improve Outcome: Progressing   Problem: Skin Integrity: Goal: Risk for impaired skin integrity will decrease Outcome: Progressing   Problem: Education: Goal: Knowledge of disease or condition will improve Outcome: Progressing Goal: Knowledge of the prescribed therapeutic regimen will improve Outcome: Progressing Goal: Individualized Educational Video(s) Outcome: Progressing   Problem: Activity: Goal: Ability to tolerate increased activity will  improve Outcome: Progressing Goal: Will verbalize the importance of balancing activity with adequate rest periods Outcome: Progressing   Problem: Respiratory: Goal: Ability to maintain a clear airway will improve Outcome: Progressing Goal: Levels of oxygenation will improve Outcome: Progressing Goal: Ability to maintain adequate ventilation will improve Outcome: Progressing

## 2019-10-05 LAB — GLUCOSE, CAPILLARY
Glucose-Capillary: 96 mg/dL (ref 70–99)
Glucose-Capillary: 166 mg/dL — ABNORMAL HIGH (ref 70–99)

## 2019-10-05 MED ORDER — CLONAZEPAM 1 MG PO TABS: 1.0000 mg | ORAL_TABLET | Freq: Two times a day (BID) | ORAL | 0 refills | Status: DC

## 2019-10-05 NOTE — Care Management Important Message (Signed)
Important Message  Patient Details  Name: NIALA CABBAGESTALK MRN: [id number] Date of Birth: [birth year]   Medicare Important Message Given:  Yes  Legal guardian aware of Medicare IM right.     Dannette Barbara 10/05/2019, 9:11 AM

## 2019-10-05 NOTE — Progress Notes (Signed)
Patient ID: Natalie Thomas, female   DOB: [birth year], 59 y.o.   MRN: [id number]  [facility] Palliative NP and I have sent the signed form required by DSS for DNR

## 2019-10-05 NOTE — Discharge Summary (Signed)
[facility] [place] NAME: Natalie Thomas    MR#:  [id number]  DATE OF BIRTH:  08-14-1960  DATE OF ADMISSION:  10/01/2019 ADMITTING PHYSICIAN: Natalie Mandes, MD  DATE OF DISCHARGE: 10/05/2019  PRIMARY CARE PHYSICIAN: System, Pcp Not In    ADMISSION DIAGNOSIS:  SOB (shortness of breath) [R06.02] Hypoxia [R09.02] Acute on chronic respiratory failure (HCC) [J96.20]  DISCHARGE DIAGNOSIS:  Acute on Chronic Respiratory failure due to acute on chronic systolic/diastolic CHF exacerbation Chronic Respiratory failure on chronic home oxygen 4 liter  SECONDARY DIAGNOSIS:   Past Medical History:  Diagnosis Date  . Anemia 10/16/2017  . Anxiety and depression   . CHF (congestive heart failure) ([place])   . Chronic hyponatremia 04/08/2007   Qualifier: Diagnosis of  By: Marca Ancona RMA, Lucy    . Closed comminuted intertrochanteric fracture of right femur ([place]) 11/07/2017  . COPD 04/08/2007   Qualifier: Diagnosis of  By: Marca Ancona RMA, Lucy    . Depression   . Diabetes mellitus, type 2 ([place])    pt denies but states that she has been treated for DM  . Essential hypertension 04/08/2007   Qualifier: Diagnosis of  By: Marca Ancona RMA, Lucy    . GERD (gastroesophageal reflux disease)   . Hyperlipidemia 10/07/2017  . Lumbar disc disease 04/09/2011  . MENOPAUSE, PREMATURE 04/08/2007   Qualifier: Diagnosis of  By: Marca Ancona RMA, Lucy    . 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 Osteoporosis 04/08/2007   Qualifier: Diagnosis of  By: Reatha Armour, Lucy    . Polycythemia secondary to smoking 04/09/2011  . 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 Schizoaffective disorder, bipolar type ([place]) 07/05/2011    HOSPITAL COURSE:  [name] a59 y.o.femalewith a known history of COPD on chronic 2 L nasal cannula oxygen, diastolic congestive heart failure chronic, hypertension, morbid obesity, hyperlipidemia, type II diabetes,  schizoaffective disorder, chronic hyponatremia suspected psychogenic polydipsia comes to the emergency room from [facility] with shortness of breath and increased oxygen requirement. Patient apparently has been drinking a lot of water according to the staff at the facility Patient herself reports craving for ice water.  Acute on chronic hypoxic/hypercarbicRespiratory failure secondary to congestive heart failure acute on chronic diastolic/systolic with excess water intake with history of secondary polydipsia -patient presented with increasing shortness of breath with increased oxygen requirement. -She is not tolerating CPAP  - Sats are 97% on 4 L --chronic. She is awake and alert -IV Lasix 40 mg BID--good UOP >11 liters so far--change to lasix 40 mg qd -low sodium diet with fluid restriction 2 L -PRN inhalers -wean to nasal cannula oxygen sats remain greater than 92% -she advised on fluid restriction. -Currently sats 97% on 4 L (chronic)  Type II diabetes--not on any meds -sugars stable -sliding scale insulin  Hypertension -continue home meds  Schizoaffective disorder will continue wellbutrin,Klonopin, Zyprexa,depakote, melatonin and Latuda -patient has chronic intermittent anxiety spells.  -Requested psychiatry consultation appreciated recommends cont same meds.  history of chronic hyponatremia -patient apparently is noted to sneak ice water at the facility -she has history of psychogenic polydipsia -on water restriction will continue the same here and advise SNF to do the same  Morbid obesity with suspected OSA -intolerant to CPAP  Multiple admissions for similar problems--palliative care consultation appreciated-- they spoke with patient's legal guardian Natalie Thomas. - Patient is DNR according to palliative care conversation with legal guardian once paperwork is signed  by me and faxed back to her legal guardian on Monday and approved by DSS supervisor--till then  keep her Full code.  Pt is at a very High risk of readmission due to her chronic medical problems and her issues with anxiety and psych issues. Pt currently is best at baseline.  Family Communication: Pt has legal guardian-d/w Ms Natalie Thomas. Pt will discharge back to [facility] today--agreeable Palliatve care has spoken with legal guardian Consults:Psych Code Status:Full code for now  DVT prophylaxis:lovenox Discharge disposition: to Rehab on Monday today TOC informed   CONSULTS OBTAINED:  Treatment Team:  Dixie Dials, MD  DRUG ALLERGIES:   Allergies  Allergen Reactions  . Clarithromycin Itching  . Penicillins Itching    Has tolerated cefazolin before  Has patient had a PCN reaction causing immediate rash, facial/tongue/throat swelling, SOB or lightheadedness with hypotension: No Has patient had a PCN reaction causing severe rash involving mucus membranes or skin necrosis: No Has patient had a PCN reaction that required hospitalization: Unknown Has patient had a PCN reaction occurring within the last 10 years: No If all of the above answers are "NO", then may proceed with Cephalosporin use.     DISCHARGE MEDICATIONS:   Allergies as of 10/05/2019      Reactions   Clarithromycin Itching   Penicillins Itching   Has tolerated cefazolin before Has patient had a PCN reaction causing immediate rash, facial/tongue/throat swelling, SOB or lightheadedness with hypotension: No Has patient had a PCN reaction causing severe rash involving mucus membranes or skin necrosis: No Has patient had a PCN reaction that required hospitalization: Unknown Has patient had a PCN reaction occurring within the last 10 years: No If all of the above answers are "NO", then may proceed with Cephalosporin use.      Medication List    TAKE these medications   acetaminophen 500 MG tablet Commonly known as: TYLENOL Take 1,000 mg by mouth every 8 (eight) hours as needed for mild pain or  moderate pain. Not to exceed 3g/24h of tylenol from all sources.   albuterol 108 (90 Base) MCG/ACT inhaler Commonly known as: VENTOLIN HFA Inhale 2 puffs into the lungs every 6 (six) hours as needed for wheezing or shortness of breath.   albuterol (2.5 MG/3ML) 0.083% nebulizer solution Commonly known as: PROVENTIL Take 3 mLs (2.5 mg total) by nebulization every 4 (four) hours as needed for wheezing or shortness of breath.   ANTACID LIQUID PO Take 30 mLs by mouth every 6 (six) hours as needed (heartburn).   ascorbic acid 1000 MG tablet Commonly known as: VITAMIN C Take 1 tablet (1,000 mg total) by mouth daily.   Breo Ellipta 100-25 MCG/INH Aepb Generic drug: fluticasone furoate-vilanterol Inhale 1 puff into the lungs daily.   buPROPion 150 MG 24 hr tablet Commonly known as: WELLBUTRIN XL Take 150 mg by mouth daily.   Calcium Carb-Cholecalciferol 600-400 MG-UNIT Tabs Take 1 tablet by mouth daily.   clonazePAM 1 MG tablet Commonly known as: KLONOPIN Take 1 tablet (1 mg total) by mouth 2 (two) times daily.   docusate sodium 100 MG capsule Commonly known as: COLACE Take 100 mg by mouth 2 (two) times daily.   feeding supplement (ENSURE ENLIVE) Liqd Take 237 mLs by mouth daily.   fludrocortisone 0.1 MG tablet Commonly known as: FLORINEF Take 1 tablet (0.1 mg total) by mouth daily.   furosemide 40 MG tablet Commonly known as: LASIX Take 1 tablet (40 mg total) by mouth daily.   hydrOXYzine  25 MG tablet Commonly known as: ATARAX/VISTARIL Take 25 mg by mouth every 6 (six) hours as needed for anxiety or itching.   loperamide 2 MG capsule Commonly known as: IMODIUM Take 2 mg by mouth every 6 (six) hours as needed for diarrhea or loose stools.   loratadine 10 MG tablet Commonly known as: CLARITIN Take 10 mg by mouth daily.   lurasidone 20 MG Tabs tablet Commonly known as: LATUDA Take 1 tablet (20 mg total) by mouth 2 (two) times daily.   melatonin 5 MG Tabs Take 5 mg  by mouth at bedtime.   multivitamin with minerals Tabs tablet Take 1 tablet by mouth daily.   neomycin-bacitracin-polymyxin ointment Commonly known as: NEOSPORIN Apply 1 application topically as needed for wound care.   nicotine 21 mg/24hr patch Commonly known as: NICODERM CQ - dosed in mg/24 hours Place 21 mg onto the skin daily.   NIFEdipine 90 MG 24 hr tablet Commonly known as: PROCARDIA XL/NIFEDICAL-XL Take 90 mg by mouth daily.   nystatin powder Generic drug: nystatin Apply 1 application topically 2 (two) times daily as needed (irritation). Apply topically to reddened area twice daily as needed for irritation   OLANZapine 10 MG tablet Commonly known as: ZYPREXA Take 10 mg by mouth 2 (two) times daily.   ondansetron 8 MG tablet Commonly known as: ZOFRAN Take 8 mg by mouth every 6 (six) hours as needed for nausea or vomiting.   pantoprazole 40 MG tablet Commonly known as: Protonix Take 1 tablet (40 mg total) by mouth daily.   polyethylene glycol 17 g packet Commonly known as: MiraLax Take 17 g by mouth daily. What changed: additional instructions   sodium chloride 0.65 % Soln nasal spray Commonly known as: OCEAN Place 2 sprays into both nostrils every 2 (two) hours as needed (dryness, irritation).   spironolactone 25 MG tablet Commonly known as: ALDACTONE Take 1 tablet (25 mg total) by mouth daily.   sucralfate 1 GM/10ML suspension Commonly known as: CARAFATE Take 10 mLs (1 g total) by mouth 4 (four) times daily -  with meals and at bedtime.   valproic acid 250 MG capsule Commonly known as: DEPAKENE Take 2 capsules (500 mg total) by mouth 3 (three) times daily.       If you experience worsening of your admission symptoms, develop shortness of breath, life threatening emergency, suicidal or homicidal thoughts you must seek medical attention immediately by calling 911 or calling your MD immediately  if symptoms less severe.  You Must read complete  instructions/literature along with all the possible adverse reactions/side effects for all the Medicines you take and that have been prescribed to you. Take any new Medicines after you have completely understood and accept all the possible adverse reactions/side effects.   Please note  You were cared for by a hospitalist during your hospital stay. If you have any questions about your discharge medications or the care you received while you were in the hospital after you are discharged, you can call the unit and asked to speak with the hospitalist on call if the hospitalist that took care of you is not available. Once you are discharged, your primary care physician will handle any further medical issues. Please note that NO REFILLS for any discharge medications will be authorized once you are discharged, as it is imperative that you return to your primary care physician (or establish a relationship with a primary care physician if you do not have one) for your aftercare needs so  that they can reassess your need for medications and monitor your lab values. Today   SUBJECTIVE   No new issues. Per RN sats 97-98 % on 4 liter [place] Eating well  VITAL SIGNS:  Blood pressure 127/90, pulse 72, temperature 98 F (36.7 C), temperature source Oral, resp. rate 18, height 5\' 1"  (1.549 m), weight 97.5 kg, SpO2 98 %.  I/O:    Intake/Output Summary (Last 24 hours) at 10/05/2019 1118 Last data filed at 10/05/2019 [id number] Gross per 24 hour  Intake 960 ml  Output 2725 ml  Net -1765 ml    PHYSICAL EXAMINATION:  GENERAL:  59 y.o.-year-old patient lying in the bed with no acute distress. obesity EYES: Pupils equal, round, reactive to light and accommodation. No scleral icterus.  HEENT: Head atraumatic, normocephalic. Oropharynx and nasopharynx clear.  NECK:  Supple, no jugular venous distention. No thyroid enlargement, no tenderness.  LUNGS: Normal breath sounds bilaterally, no wheezing, rales,rhonchi or  crepitation. No use of accessory muscles of respiration.  CARDIOVASCULAR: S1, S2 normal. No murmurs, rubs, or gallops.  ABDOMEN: Soft, non-tender, non-distended. Bowel sounds present. No organomegaly or mass. abdominal obesity EXTREMITIES: No pedal edema, cyanosis, or clubbing.  NEUROLOGIC: Cranial nerves II through XII are intact. Muscle strength 5/5 in all extremities. Sensation intact. Gait not checked.  PSYCHIATRIC:ax[place] 3--anxious SKIN: No obvious rash, lesion, or ulcer.        DATA REVIEW:   CBC  Recent Labs  Lab 10/01/19 1519  WBC 6.7  HGB 12.5  HCT 38.9  PLT 155    Chemistries  Recent Labs  Lab 10/01/19 1519 10/02/19 0438 10/04/19 0912  NA 132*   < > 129*  K 4.6   < > 4.6  CL 93*   < > 86*  CO2 31   < > 32  GLUCOSE 131*   < > 96  BUN 19   < > 28*  CREATININE 0.76   < > 0.68  CALCIUM 9.2   < > 8.7*  AST 13*  --   --   ALT 12  --   --   ALKPHOS 59  --   --   BILITOT 0.5  --   --    < > = values in this interval not displayed.    Microbiology Results   Recent Results (from the past 240 hour(s))  Respiratory Panel by RT PCR (Flu A&B, Covid) - Nasopharyngeal Swab     Status: None   Collection Time: 10/01/19  4:13 PM   Specimen: Nasopharyngeal Swab  Result Value Ref Range Status   SARS Coronavirus 2 by RT PCR NEGATIVE NEGATIVE Final    Comment: (NOTE) SARS-CoV-2 target nucleic acids are NOT DETECTED. The SARS-CoV-2 RNA is generally detectable in upper respiratoy specimens during the acute phase of infection. The lowest concentration of SARS-CoV-2 viral copies this assay can detect is 131 copies/mL. A negative result does not preclude SARS-Cov-2 infection and should not be used as the sole basis for treatment or other patient management decisions. A negative result may occur with  improper specimen collection/handling, submission of specimen other than nasopharyngeal swab, presence of viral mutation(s) within the areas targeted by this assay, and  inadequate number of viral copies (<131 copies/mL). A negative result must be combined with clinical observations, patient history, and epidemiological information. The expected result is Negative. Fact Sheet for Patients:  PinkCheek.be Fact Sheet for Healthcare Providers:  GravelBags.it This test is not yet ap proved or cleared by the [place]  FDA and  has been authorized for detection and/or diagnosis of SARS-CoV-2 by FDA under an Emergency Use Authorization (EUA). This EUA will remain  in effect (meaning this test can be used) for the duration of the COVID-19 declaration under Section 564(b)(1) of the Act, 21 U.S.C. section 360bbb-3(b)(1), unless the authorization is terminated or revoked sooner.    Influenza A by PCR NEGATIVE NEGATIVE Final   Influenza B by PCR NEGATIVE NEGATIVE Final    Comment: (NOTE) The Xpert Xpress SARS-CoV-2/FLU/RSV assay is intended as an aid in  the diagnosis of influenza from Nasopharyngeal swab specimens and  should not be used as a sole basis for treatment. Nasal washings and  aspirates are unacceptable for Xpert Xpress SARS-CoV-2/FLU/RSV  testing. Fact Sheet for Patients: PinkCheek.be Fact Sheet for Healthcare Providers: GravelBags.it This test is not yet approved or cleared by the [place] FDA and  has been authorized for detection and/or diagnosis of SARS-CoV-2 by  FDA under an Emergency Use Authorization (EUA). This EUA will remain  in effect (meaning this test can be used) for the duration of the  Covid-19 declaration under Section 564(b)(1) of the Act, 21  U.S.C. section 360bbb-3(b)(1), unless the authorization is  terminated or revoked. Performed at [facility], [address] [place], [place] [address]   MRSA PCR Screening     Status: Abnormal   Collection Time: 10/01/19 11:05 PM   Specimen:  Nasopharyngeal  Result Value Ref Range Status   MRSA by PCR POSITIVE (A) NEGATIVE Final    Comment:        The GeneXpert MRSA Assay (FDA approved for NASAL specimens only), is one component of a comprehensive MRSA colonization surveillance program. It is not intended to diagnose MRSA infection nor to guide or monitor treatment for MRSA infections. RESULT CALLED TO, READ BACK BY AND VERIFIED WITH: [place] [phone] 10/02/19 HNM Performed at [facility], [address], [place], [place] [address]     RADIOLOGY:  No results found.   CODE STATUS:     Code Status Orders  (From admission, onward)         Start     Ordered   10/01/19 1952  Full code  Continuous     10/01/19 1951        Code Status History    Date Active Date Inactive Code Status Order ID Comments User Context   09/22/2019 2053 09/26/2019 2354 Full Code [id number]  Elwyn Reach, MD ED   09/13/2019 1620 09/16/2019 2329 Full Code [id number]  Collier Bullock, MD ED   09/04/2019 1743 09/08/2019 1935 Full Code [id number]  Natalie Mandes, MD ED   07/09/2019 1127 07/17/2019 2007 Full Code [id number]  Norval Morton, MD ED   06/30/2019 2159 07/06/2019 1341 Full Code [id number]  Orene Desanctis, DO ED   05/01/2019 1438 05/16/2019 0020 Full Code [id number]  Karmen Bongo, MD ED   04/22/2019 0100 04/24/2019 2206 Full Code [id number]  Elwyn Reach, MD Inpatient   03/09/2019 1918 03/16/2019 1713 Full Code [id number]  Orene Desanctis, DO ED   12/03/2018 1624 12/18/2018 2147 Full Code [id number]  Germain Osgood, PA-C ED   02/27/2018 2258 02/28/2018 0447 Full Code [id number]  Tivis Ringer, Cortni S, PA-C ED   11/09/2017 0035 11/20/2017 1756 Full Code [id number]  Omar Person, NP Inpatient   11/07/2017 0854 11/09/2017 0035 Full Code [id number]  Rondel Jumbo, PA-C ED   11/07/2017 0854 11/07/2017 0854 Full Code [id number]  Elease Hashimoto ED   10/16/2017 1003 10/19/2017 1908 Full Code [id number]  Jani Gravel, MD ED   10/07/2017 0030  10/08/2017 2013 Full Code [id number]  Norval Morton, MD ED   07/31/2017 0309 08/12/2017 1840 Full Code [id number]  Damita Lack, MD ED   08/18/2011 2311 08/21/2011 1248 Full Code [id number]  Mackie Pai., MD ED   08/18/2011 1803 08/18/2011 1819 Full Code [id number]  Linus Mako, [place] ED   07/21/2011 0310 07/24/2011 2323 Full Code [id number]  Garald Balding, NP ED   07/02/2011 1258 07/04/2011 1914 Full Code [id number]  Sheliah Mends ED   06/16/2011 0606 06/17/2011 2024 Full Code [id number]  Linus Mako, PA ED   Advance Care Planning Activity       TOTAL TIME TAKING CARE OF THIS PATIENT: *40* minutes.    Natalie Thomas M.D  [facility]    CC: Primary care physician; System, Pcp Not In

## 2019-10-05 NOTE — Progress Notes (Signed)
Discharge instructions gone over with Natalie Thomas at [facility]. Attempted to call legal guardian, [name] without any success. MD already spoke with legal guardian this am about discharge and was notified by her that patient was discharging today. EMS has already been called by case manager. Patient discharging back to [name].

## 2019-10-05 NOTE — Progress Notes (Signed)
Palliative: Natalie Thomas, Mean, is sitting up in the bed.  She greets me making and somewhat keeping eye contact.  She appears chronically ill, but is able to make her basic needs known.  She is a ward of the state with the legal guardian through [place].  I share with Natalie Thomas that she will likely be returning to her facility today.  She has no questions at this time.  Paperwork completed with attending for Natalie Thomas to have DNR status.  Email update to her DSS social worker, [name].    Plan: Return to long-term care facility with palliative/hospice services.  22 minutes Quinn Axe, NP Palliative Medicine Team Team Phone # [phone] Greater than 50% of this time was spent counseling and coordinating care related to the above assessment and plan.

## 2019-10-05 NOTE — Discharge Instructions (Signed)
FLUID RESTRICTION PER DAY 2000 ml 2 gram sodium diet

## 2019-10-07 ENCOUNTER — Encounter: Payer: Self-pay | Admitting: Nurse Practitioner

## 2019-10-07 ENCOUNTER — Other Ambulatory Visit: Payer: Self-pay

## 2019-10-07 ENCOUNTER — Non-Acute Institutional Stay: Payer: Medicare Other | Admitting: Nurse Practitioner

## 2019-10-07 VITALS — BP 132/64 | HR 98 | Temp 97.2°F | Resp 18 | Wt 240.0 lb

## 2019-10-07 DIAGNOSIS — I509 Heart failure, unspecified: Secondary | ICD-10-CM

## 2019-10-07 DIAGNOSIS — Z515 Encounter for palliative care: Secondary | ICD-10-CM

## 2019-10-07 NOTE — Progress Notes (Signed)
[employer] Palliative Care Consult Note Telephone: [phone]  Fax: [phone]  PATIENT NAME: Natalie Thomas DOB: 24-Dec-1960 MRN: [id number]  PRIMARY CARE PROVIDER:  Dr Yves Dill PROVIDER:  Dr Hodges/[facility] RESPONSIBLE PARTY:   [name] [id number], [phone]  I was asked by Dr Nyra Capes to see Natalie Thomas for Palliative care consult for goals of care  RECOMMENDATIONS and PLAN:  1. ACP: Full code, with discussion of DSS guardian seeking DNR in review process; discussed MOST form; Discussed option of Hospice services   2. Palliative care encounter; Palliative medicine team will continue to support patient, patient's family, and medical team. Visit consisted of counseling and education dealing with the complex and emotionally intense issues of symptom management and palliative care in the setting of serious and potentially life-threatening illness  I spent 70 minutes providing this consultation,  from 2:20pm to 3:30pm. More than 50% of the time in this consultation was spent coordinating communication.   HISTORY OF PRESENT ILLNESS:  Natalie Thomas is a 59 y.o. year old female with multiple medical problems including Malignant neoplasm volvulus, polycythemia, diastolic congestive heart failure, history of pleural effusion, chronic respiratory failure with hypoxia and hypercapnia, anemia, COPD, hypertension, diabetes, chronic hyponatremia, hyperlipidemia, gerd, lumbar disc disease, osteoporosis, schizoaffective disorder bipolar type, anxiety, depression, right intramedullary nail intertrochanteric, pelvic surgery for fracture, eye surgery.   Hospitalized 10 / 27 / 2020 to 10 / 30 / 2020 hospitalized with vomiting, abdominal pain. Workout malignant hypertensive urgency on labetalol drip, acute on chronic hyponatremia range 125 to 130. Abdominal pain with CT umbilical hernia of bowel [place] without evidence of  incarceration. COPD with chronic hypoxic respiratory failure. Natalie Thomas was discharged to [facility] facility.   Hospitalized 05/01/2019 to 05/15/2019 for worsening hyponatremia, hypertensive crisis. Psychiatry consulted for anxiety and pulling at medical devices polypharmacy. Acute metabolic encephalopathy in the setting of mental illness EEG without seizures. CHF compensated.   Hospitalize 1 / 10/2019 to 1 / 11 / 2021 diffuse worsening abdominal pain with workup unknown etiology abdominal x-ray moderate stool burden CT abdomen pelvis unremarkable mild thickening along the anterior right wall of bladder.  Hospitalized 1 / 14 / 2021 to 1 / 22 / 2021 for epigastric abdominal pain with nausea with sodium 118 workout significant for hyponatremia concern for psychogenic polydipsia vs siadh with Nephrology consulted. Celexa discontinued in addition to Lisinopril. 2 liter fluid restriction. Urine sodium slightly high NaCl and demeclocycline stopped by nephrology. Continue florinef. Chronic diastolic congestive heart failure appears euvolemic.   Hospitalize 3 / 12 / 2021 to 3 / 16 / 2021 for shortness of breath work of significant for acute on chronic hypoxic with hypercapnia respiratory failure secondary to COPD in the setting of covid pneumonia and congestive heart failure. She refused to wear bipap.  Multiple CT scans for abdominal pain rebuilt ventricular hernia without incarceration in the past EGD done 06/2019 show the disease gastritis.  Hospitalized 09/13/2019 to 3 / 24 / 2021 for hypoxia placed on CPAP then changed to BiPAP treated with IV Lasix, develop bradycardia heart rate in the 40s the remained asymptomatic held meditope brawl. She transitioned from HFNC from BiPAP then to 4L/[place]ley completed Decadron.  Hospitalized 3 / 30 / 2021 to 4 / 3 / 2021 for respiratory failure secondary to hypoxia from congestive heart failure and COPD. Workup significant for bilateral pleural effusions with  pulmonary edema treated with IV Lasix and underwent left-sided thoracentesis with  250 ccs fluid returned. Fluid cytology negative for malignancy. Symptoms improved in discharge back to Skilled facility.   Hospitalized 4/ 8 / 2021 to 4 / 12 / 2021 for shortness of breath, acute respiratory with work up significant for acute on chronic hypoxic with hypercarbic respiratory failure secondary to congestive heart failure acute on chronic diastolic systolic with excess water intake and history of secondary polydipsia not tolerating CPAP. Did receive IV Lasix. Continued for Schizoaffective disorder Wellbutrin, Klonopin, Zyprexa, Depakote, melatonin, latuda with intermittent chronic anxiety spells with Psychiatry consultative. History of chronic hyponatremia where she is noted to sneak water at the facility. Morbid obesity with suspected OSA intolerant to CPAP. Palliative care they consult during hospitalization. PC talked with [name] Guardian for documentation patient was living and assisted living [facility] and they cannot continue work are due to or fluid restrictions. She was transferred to [facility]. Code status was discussed treat the treatable but allow a natural death and formalize paperwork from DSS to be completed agreeable for DNR but has to be approved by DSS supervisor, until then full code  Natalie Thomas currently resides at [facility]. Natalie Thomas does require assistance for adl's, transfer to the wheelchair where she is mobile. Natalie Thomas does require assistance with ADLs as she does become fatigued with exertion. Natalie Thomas does remain on 2 liters of oxygen. Natalie Thomas does feed herself an appetite varies depending on the meal. At present Natalie Thomas is lying in bed. Natalie Thomas endorses she got out of her wheelchair and was having more problems with anxiety. We talked about purpose for palliative care visit. We talked about how she was feeling today. Natalie Thomas endorses  seems to be having a harder time today. Natalie Thomas shared she does have more shortness of breath with exertion that comes and goes. We talked about her being dependent on oxygen. Natalie Thomas talked about her anxiety. We talked about her appetite. Natalie Thomas was cooperative with assessment. We talked about residing at facility with challenges that concerns her. Natalie Thomas talked about we talked about role of palliative care and plan of care. Emotional support provided. I did contact and leave a message for [name] for further discuss the medical goals of care and to check on the code status, DNR pending form. I updated nursing staff any changes at present time. We'll revisit in 2 days for follow-up and further discussion of medical goals of care. [name] return my call and updated on palliative care visit. We talked about DNR and we talked about option of hospice benefit under Medicare. [name] will follow up with family and we'll send notes. . Palliative Care was asked to help address goals of care.   CODE STATUS: Full code  PPS: 40% HOSPICE ELIGIBILITY/DIAGNOSIS: TBD  PAST MEDICAL HISTORY:  Past Medical History:  Diagnosis Date  . Anemia 10/16/2017  . Anxiety and depression   . CHF (congestive heart failure) ([place])   . Chronic hyponatremia 04/08/2007   Qualifier: Diagnosis of  By: Marca Ancona RMA, Lucy    . Closed comminuted intertrochanteric fracture of right femur ([place]) 11/07/2017  . COPD 04/08/2007   Qualifier: Diagnosis of  By: Marca Ancona RMA, Lucy    . Depression   . Diabetes mellitus, type 2 ([place])    pt denies but states that she has been treated for DM  . Essential hypertension 04/08/2007   Qualifier: Diagnosis of  By: Marca Ancona RMA, Lucy    . GERD (gastroesophageal reflux disease)   .  Hyperlipidemia 10/07/2017  . Lumbar disc disease 04/09/2011  . MENOPAUSE, PREMATURE 04/08/2007   Qualifier: Diagnosis of  By: Marca Ancona RMA, Lucy    . NEOPLASM, MALIGNANT, VULVA 04/08/2007   Qualifier: Diagnosis of   By: Marca Ancona RMA, Lucy    . Osteoporosis 04/08/2007   Qualifier: Diagnosis of  By: Reatha Armour, Lucy    . Polycythemia secondary to smoking 04/09/2011  . 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 Schizoaffective disorder, bipolar type ([place]) 07/05/2011    SOCIAL HX:  Social History   Tobacco Use  . Smoking status: Former Smoker    Packs/day: 0.25    Types: Cigarettes    Quit date: 07/23/2019    Years since quitting: 0.2  . Smokeless tobacco: Never Used  Substance Use Topics  . Alcohol use: No    ALLERGIES:  Allergies  Allergen Reactions  . Clarithromycin Itching  . Penicillins Itching    Has tolerated cefazolin before  Has patient had a PCN reaction causing immediate rash, facial/tongue/throat swelling, SOB or lightheadedness with hypotension: No Has patient had a PCN reaction causing severe rash involving mucus membranes or skin necrosis: No Has patient had a PCN reaction that required hospitalization: Unknown Has patient had a PCN reaction occurring within the last 10 years: No If all of the above answers are "NO", then may proceed with Cephalosporin use.      PERTINENT MEDICATIONS:  Outpatient Encounter Medications as of 10/07/2019  Medication Sig  . acetaminophen (TYLENOL) 500 MG tablet Take 1,000 mg by mouth every 8 (eight) hours as needed for mild pain or moderate pain. Not to exceed 3g/24h of tylenol from all sources.  Marland Kitchen albuterol (PROVENTIL) (2.5 MG/3ML) 0.083% nebulizer solution Take 3 mLs (2.5 mg total) by nebulization every 4 (four) hours as needed for wheezing or shortness of breath.  Marland Kitchen albuterol (VENTOLIN HFA) 108 (90 Base) MCG/ACT inhaler Inhale 2 puffs into the lungs every 6 (six) hours as needed for wheezing or shortness of breath.  . Alum & Mag Hydroxide-Simeth (ANTACID LIQUID PO) Take 30 mLs by mouth every 6 (six) hours as needed (heartburn).  Marland Kitchen ascorbic acid (VITAMIN C) 1000 MG tablet Take 1 tablet (1,000 mg total) by mouth daily.  Marland Kitchen buPROPion (WELLBUTRIN XL) 150 MG 24  hr tablet Take 150 mg by mouth daily.  . Calcium Carb-Cholecalciferol 600-400 MG-UNIT TABS Take 1 tablet by mouth daily.  . clonazePAM (KLONOPIN) 1 MG tablet Take 1 tablet (1 mg total) by mouth 2 (two) times daily.  Marland Kitchen docusate sodium (COLACE) 100 MG capsule Take 100 mg by mouth 2 (two) times daily.   . feeding supplement, ENSURE ENLIVE, (ENSURE ENLIVE) LIQD Take 237 mLs by mouth daily.  . fludrocortisone (FLORINEF) 0.1 MG tablet Take 1 tablet (0.1 mg total) by mouth daily.  . fluticasone furoate-vilanterol (BREO ELLIPTA) 100-25 MCG/INH AEPB Inhale 1 puff into the lungs daily.  . furosemide (LASIX) 40 MG tablet Take 1 tablet (40 mg total) by mouth daily.  . hydrOXYzine (ATARAX/VISTARIL) 25 MG tablet Take 25 mg by mouth every 6 (six) hours as needed for anxiety or itching.  . loperamide (IMODIUM) 2 MG capsule Take 2 mg by mouth every 6 (six) hours as needed for diarrhea or loose stools.   Marland Kitchen loratadine (CLARITIN) 10 MG tablet Take 10 mg by mouth daily.  Marland Kitchen lurasidone (LATUDA) 20 MG TABS tablet Take 1 tablet (20 mg total) by mouth 2 (two) times daily.  . Melatonin 5 MG TABS Take 5 mg by mouth at bedtime.  Marland Kitchen  Multiple Vitamin (MULTIVITAMIN WITH MINERALS) TABS tablet Take 1 tablet by mouth daily.  Marland Kitchen neomycin-bacitracin-polymyxin (NEOSPORIN) ointment Apply 1 application topically as needed for wound care.  . nicotine (NICODERM CQ - DOSED IN MG/24 HOURS) 21 mg/24hr patch Place 21 mg onto the skin daily.   Marland Kitchen NIFEdipine (PROCARDIA XL/ADALAT-CC) 90 MG 24 hr tablet Take 90 mg by mouth daily.  Marland Kitchen nystatin (NYSTATIN) powder Apply 1 application topically 2 (two) times daily as needed (irritation). Apply topically to reddened area twice daily as needed for irritation  . OLANZapine (ZYPREXA) 10 MG tablet Take 10 mg by mouth 2 (two) times daily.  . ondansetron (ZOFRAN) 8 MG tablet Take 8 mg by mouth every 6 (six) hours as needed for nausea or vomiting.  . pantoprazole (PROTONIX) 40 MG tablet Take 1 tablet (40 mg  total) by mouth daily.  . polyethylene glycol (MIRALAX) 17 g packet Take 17 g by mouth daily. (Patient taking differently: Take 17 g by mouth daily. Mix 1 capful (17 g) in 4-8 oz of water or juice and give by mouth once daily for bowel regimen)  . sodium chloride (OCEAN) 0.65 % SOLN nasal spray Place 2 sprays into both nostrils every 2 (two) hours as needed (dryness, irritation).  Marland Kitchen spironolactone (ALDACTONE) 25 MG tablet Take 1 tablet (25 mg total) by mouth daily.  . sucralfate (CARAFATE) 1 GM/10ML suspension Take 10 mLs (1 g total) by mouth 4 (four) times daily -  with meals and at bedtime.  . valproic acid (DEPAKENE) 250 MG capsule Take 2 capsules (500 mg total) by mouth 3 (three) times daily.   No facility-administered encounter medications on file as of 10/07/2019.    PHYSICAL EXAM:   General: debilitated, O2 dependent, chronically ill, anxious female Cardiovascular: regular rate and rhythm Pulmonary: few wheezes Extremities: +BLE edema, no joint deformities Neurological: w/c dependent Amilah Greenspan Ihor Gully, NP

## 2019-10-08 ENCOUNTER — Telehealth: Payer: Self-pay | Admitting: Family

## 2019-10-08 NOTE — Telephone Encounter (Signed)
Spoke with Patients legal guardian [name] and staff at [facility] to confirm patients NP [facility]F [facility] appointment for 4/28. Staff stated patient is doing well, taking her medications without issues, following a low sodium diet, and has no complaints. They are taking her daily weights as well.   Alyse Low, [place]

## 2019-10-13 ENCOUNTER — Ambulatory Visit: Payer: Medicare Other | Admitting: Family

## 2019-10-18 ENCOUNTER — Other Ambulatory Visit: Payer: Self-pay

## 2019-10-18 ENCOUNTER — Emergency Department: Payer: Medicare Other

## 2019-10-18 ENCOUNTER — Inpatient Hospital Stay
Admission: EM | Admit: 2019-10-18 | Discharge: 2019-10-26 | DRG: 291 | Disposition: A | Discharge status: Skilled Nursing Facility | Payer: Medicare Other | Source: Skilled Nursing Facility | Attending: Internal Medicine | Admitting: Internal Medicine

## 2019-10-18 DIAGNOSIS — K5900 Constipation, unspecified: Secondary | ICD-10-CM | POA: Diagnosis present

## 2019-10-18 DIAGNOSIS — E871 Hypo-osmolality and hyponatremia: Secondary | ICD-10-CM | POA: Diagnosis present

## 2019-10-18 DIAGNOSIS — J9601 Acute respiratory failure with hypoxia: Principal | ICD-10-CM

## 2019-10-18 DIAGNOSIS — D649 Anemia, unspecified: Secondary | ICD-10-CM | POA: Diagnosis not present

## 2019-10-18 DIAGNOSIS — Z87891 Personal history of nicotine dependence: Secondary | ICD-10-CM

## 2019-10-18 DIAGNOSIS — Z66 Do not resuscitate: Secondary | ICD-10-CM | POA: Diagnosis present

## 2019-10-18 DIAGNOSIS — Z79899 Other long term (current) drug therapy: Secondary | ICD-10-CM

## 2019-10-18 DIAGNOSIS — I110 Hypertensive heart disease with heart failure: Secondary | ICD-10-CM | POA: Diagnosis present

## 2019-10-18 DIAGNOSIS — F329 Major depressive disorder, single episode, unspecified: Secondary | ICD-10-CM | POA: Diagnosis present

## 2019-10-18 DIAGNOSIS — Z6841 Body Mass Index (BMI) 40.0 and over, adult: Secondary | ICD-10-CM

## 2019-10-18 DIAGNOSIS — I081 Rheumatic disorders of both mitral and tricuspid valves: Secondary | ICD-10-CM | POA: Diagnosis present

## 2019-10-18 DIAGNOSIS — E875 Hyperkalemia: Secondary | ICD-10-CM | POA: Diagnosis not present

## 2019-10-18 DIAGNOSIS — J9622 Acute and chronic respiratory failure with hypercapnia: Secondary | ICD-10-CM | POA: Diagnosis present

## 2019-10-18 DIAGNOSIS — J9621 Acute and chronic respiratory failure with hypoxia: Secondary | ICD-10-CM | POA: Diagnosis present

## 2019-10-18 DIAGNOSIS — M810 Age-related osteoporosis without current pathological fracture: Secondary | ICD-10-CM | POA: Diagnosis present

## 2019-10-18 DIAGNOSIS — R631 Polydipsia: Secondary | ICD-10-CM | POA: Diagnosis present

## 2019-10-18 DIAGNOSIS — R451 Restlessness and agitation: Secondary | ICD-10-CM | POA: Diagnosis present

## 2019-10-18 DIAGNOSIS — M954 Acquired deformity of chest and rib: Secondary | ICD-10-CM | POA: Diagnosis present

## 2019-10-18 DIAGNOSIS — I5043 Acute on chronic combined systolic (congestive) and diastolic (congestive) heart failure: Secondary | ICD-10-CM | POA: Diagnosis present

## 2019-10-18 DIAGNOSIS — E669 Obesity, unspecified: Secondary | ICD-10-CM | POA: Diagnosis not present

## 2019-10-18 DIAGNOSIS — R0602 Shortness of breath: Secondary | ICD-10-CM | POA: Diagnosis present

## 2019-10-18 DIAGNOSIS — I10 Essential (primary) hypertension: Secondary | ICD-10-CM | POA: Diagnosis present

## 2019-10-18 DIAGNOSIS — K219 Gastro-esophageal reflux disease without esophagitis: Secondary | ICD-10-CM | POA: Diagnosis present

## 2019-10-18 DIAGNOSIS — J9602 Acute respiratory failure with hypercapnia: Secondary | ICD-10-CM

## 2019-10-18 DIAGNOSIS — M5136 Other intervertebral disc degeneration, lumbar region: Secondary | ICD-10-CM | POA: Diagnosis present

## 2019-10-18 DIAGNOSIS — J441 Chronic obstructive pulmonary disease with (acute) exacerbation: Secondary | ICD-10-CM | POA: Diagnosis present

## 2019-10-18 DIAGNOSIS — Z8544 Personal history of malignant neoplasm of other female genital organs: Secondary | ICD-10-CM

## 2019-10-18 DIAGNOSIS — I5031 Acute diastolic (congestive) heart failure: Secondary | ICD-10-CM | POA: Diagnosis present

## 2019-10-18 DIAGNOSIS — I160 Hypertensive urgency: Secondary | ICD-10-CM | POA: Diagnosis present

## 2019-10-18 DIAGNOSIS — R454 Irritability and anger: Secondary | ICD-10-CM | POA: Diagnosis present

## 2019-10-18 DIAGNOSIS — Z79890 Hormone replacement therapy (postmenopausal): Secondary | ICD-10-CM

## 2019-10-18 DIAGNOSIS — F250 Schizoaffective disorder, bipolar type: Secondary | ICD-10-CM | POA: Diagnosis present

## 2019-10-18 DIAGNOSIS — I313 Pericardial effusion (noninflammatory): Secondary | ICD-10-CM | POA: Diagnosis present

## 2019-10-18 DIAGNOSIS — E876 Hypokalemia: Secondary | ICD-10-CM | POA: Diagnosis present

## 2019-10-18 DIAGNOSIS — Z9981 Dependence on supplemental oxygen: Secondary | ICD-10-CM | POA: Diagnosis not present

## 2019-10-18 DIAGNOSIS — I340 Nonrheumatic mitral (valve) insufficiency: Secondary | ICD-10-CM | POA: Diagnosis not present

## 2019-10-18 DIAGNOSIS — R4689 Other symptoms and signs involving appearance and behavior: Secondary | ICD-10-CM | POA: Diagnosis not present

## 2019-10-18 DIAGNOSIS — Z880 Allergy status to penicillin: Secondary | ICD-10-CM

## 2019-10-18 DIAGNOSIS — Z8616 Personal history of COVID-19: Secondary | ICD-10-CM

## 2019-10-18 DIAGNOSIS — J449 Chronic obstructive pulmonary disease, unspecified: Secondary | ICD-10-CM | POA: Diagnosis not present

## 2019-10-18 DIAGNOSIS — E119 Type 2 diabetes mellitus without complications: Secondary | ICD-10-CM | POA: Diagnosis present

## 2019-10-18 DIAGNOSIS — Z881 Allergy status to other antibiotic agents status: Secondary | ICD-10-CM

## 2019-10-18 DIAGNOSIS — J9600 Acute respiratory failure, unspecified whether with hypoxia or hypercapnia: Secondary | ICD-10-CM | POA: Diagnosis present

## 2019-10-18 DIAGNOSIS — F419 Anxiety disorder, unspecified: Secondary | ICD-10-CM | POA: Diagnosis not present

## 2019-10-18 LAB — CBC WITH DIFFERENTIAL/PLATELET
Abs Immature Granulocytes: 0.12 10*3/uL — ABNORMAL HIGH (ref 0.00–0.07)
Basophils Absolute: 0 10*3/uL (ref 0.0–0.1)
Basophils Relative: 0 %
Eosinophils Absolute: 0 10*3/uL (ref 0.0–0.5)
Eosinophils Relative: 0 %
HCT: 33.3 % — ABNORMAL LOW (ref 36.0–46.0)
Hemoglobin: 10.6 g/dL — ABNORMAL LOW (ref 12.0–15.0)
Immature Granulocytes: 1 %
Lymphocytes Relative: 10 %
Lymphs Abs: 1.1 10*3/uL (ref 0.7–4.0)
MCH: 28.9 pg (ref 26.0–34.0)
MCHC: 31.8 g/dL (ref 30.0–36.0)
MCV: 90.7 fL (ref 80.0–100.0)
Monocytes Absolute: 1.1 10*3/uL — ABNORMAL HIGH (ref 0.1–1.0)
Monocytes Relative: 10 %
Neutro Abs: 8.7 10*3/uL — ABNORMAL HIGH (ref 1.7–7.7)
Neutrophils Relative %: 79 %
Platelets: 162 10*3/uL (ref 150–400)
RBC: 3.67 MIL/uL — ABNORMAL LOW (ref 3.87–5.11)
RDW: 13.1 % (ref 11.5–15.5)
WBC: 11 10*3/uL — ABNORMAL HIGH (ref 4.0–10.5)
nRBC: 0 % (ref 0.0–0.2)

## 2019-10-18 LAB — RESPIRATORY PANEL BY RT PCR (FLU A&B, COVID)
Influenza A by PCR: NEGATIVE
Influenza B by PCR: NEGATIVE
SARS Coronavirus 2 by RT PCR: NEGATIVE

## 2019-10-18 LAB — BLOOD GAS, VENOUS
Acid-Base Excess: 11.3 mmol/L — ABNORMAL HIGH (ref 0.0–2.0)
Bicarbonate: 40.6 mmol/L — ABNORMAL HIGH (ref 20.0–28.0)
Delivery systems: POSITIVE
FIO2: 0.5
O2 Saturation: 70.7 %
PEEP: 5 cmH2O
Patient temperature: 37
Pressure support: 16 cmH2O
pCO2, Ven: 77 mmHg (ref 44.0–60.0)
pH, Ven: 7.33 (ref 7.250–7.430)
pO2, Ven: 40 mmHg (ref 32.0–45.0)

## 2019-10-18 LAB — BASIC METABOLIC PANEL
Anion gap: 11 (ref 5–15)
BUN: 13 mg/dL (ref 6–20)
CO2: 30 mmol/L (ref 22–32)
Calcium: 8.5 mg/dL — ABNORMAL LOW (ref 8.9–10.3)
Chloride: 83 mmol/L — ABNORMAL LOW (ref 98–111)
Creatinine, Ser: 0.78 mg/dL (ref 0.44–1.00)
GFR calc Af Amer: >60 mL/min (ref >60)
GFR calc non Af Amer: >60 mL/min (ref >60)
Glucose, Bld: 128 mg/dL — ABNORMAL HIGH (ref 70–99)
Potassium: 4 mmol/L (ref 3.5–5.1)
Sodium: 124 mmol/L — ABNORMAL LOW (ref 135–145)

## 2019-10-18 LAB — BRAIN NATRIURETIC PEPTIDE: B Natriuretic Peptide: 580 pg/mL — ABNORMAL HIGH (ref 0.0–100.0)

## 2019-10-18 LAB — TROPONIN I (HIGH SENSITIVITY): Troponin I (High Sensitivity): 35 ng/L — ABNORMAL HIGH (ref <18)

## 2019-10-18 MED ORDER — IPRATROPIUM-ALBUTEROL 0.5-2.5 (3) MG/3ML IN SOLN
3.0000 mL | Freq: Four times a day (QID) | RESPIRATORY_TRACT | Status: DC
  Administered 2019-10-19 – 2019-10-21 (×10): 3 mL via RESPIRATORY_TRACT
  Filled 2019-10-18 (×10): qty 3

## 2019-10-18 MED ORDER — CLONAZEPAM 1 MG PO TABS
1.0000 mg | ORAL_TABLET | Freq: Two times a day (BID) | ORAL | Status: DC
  Administered 2019-10-18 – 2019-10-24 (×13): 1 mg via ORAL
  Filled 2019-10-18 (×7): qty 1
  Filled 2019-10-18: qty 2
  Filled 2019-10-18 (×5): qty 1

## 2019-10-18 MED ORDER — POLYETHYLENE GLYCOL 3350 17 G PO PACK
17.0000 g | PACK | Freq: Every day | ORAL | Status: DC
  Administered 2019-10-19 – 2019-10-25 (×6): 17 g via ORAL
  Filled 2019-10-18 (×7): qty 1

## 2019-10-18 MED ORDER — DOCUSATE SODIUM 100 MG PO CAPS
100.0000 mg | ORAL_CAPSULE | Freq: Two times a day (BID) | ORAL | Status: DC
  Administered 2019-10-18 – 2019-10-25 (×15): 100 mg via ORAL
  Filled 2019-10-18 (×16): qty 1

## 2019-10-18 MED ORDER — PANTOPRAZOLE SODIUM 40 MG PO TBEC
40.0000 mg | DELAYED_RELEASE_TABLET | Freq: Every day | ORAL | Status: DC
  Administered 2019-10-19 – 2019-10-26 (×8): 40 mg via ORAL
  Filled 2019-10-18 (×8): qty 1

## 2019-10-18 MED ORDER — SODIUM CHLORIDE 0.9 % IV SOLN: 250.0000 mL | INTRAVENOUS | Status: DC | PRN

## 2019-10-18 MED ORDER — ONDANSETRON HCL 4 MG/2ML IJ SOLN
INTRAMUSCULAR | Status: AC
  Filled 2019-10-18: qty 2

## 2019-10-18 MED ORDER — MORPHINE SULFATE (PF) 2 MG/ML IV SOLN
2.0000 mg | INTRAVENOUS | Status: DC | PRN
  Administered 2019-10-19 – 2019-10-24 (×14): 2 mg via INTRAVENOUS
  Filled 2019-10-18 (×14): qty 1

## 2019-10-18 MED ORDER — MELATONIN 5 MG PO TABS
5.0000 mg | ORAL_TABLET | Freq: Every day | ORAL | Status: DC
  Administered 2019-10-19 – 2019-10-25 (×8): 5 mg via ORAL
  Filled 2019-10-18 (×8): qty 1

## 2019-10-18 MED ORDER — HYDROCOD POLST-CPM POLST ER 10-8 MG/5ML PO SUER
5.0000 mL | Freq: Two times a day (BID) | ORAL | Status: DC | PRN
  Administered 2019-10-19 – 2019-10-24 (×5): 5 mL via ORAL
  Filled 2019-10-18 (×5): qty 5

## 2019-10-18 MED ORDER — FUROSEMIDE 10 MG/ML IJ SOLN
80.0000 mg | Freq: Once | INTRAMUSCULAR | Status: AC
  Administered 2019-10-18: 80 mg via INTRAVENOUS
  Filled 2019-10-18: qty 8

## 2019-10-18 MED ORDER — ENSURE ENLIVE PO LIQD
237.0000 mL | ORAL | Status: DC
  Administered 2019-10-21 – 2019-10-25 (×5): 237 mL via ORAL

## 2019-10-18 MED ORDER — LISINOPRIL 10 MG PO TABS
10.0000 mg | ORAL_TABLET | Freq: Every day | ORAL | Status: DC
  Administered 2019-10-19 – 2019-10-26 (×7): 10 mg via ORAL
  Filled 2019-10-18 (×6): qty 1
  Filled 2019-10-18: qty 2
  Filled 2019-10-18 (×2): qty 1

## 2019-10-18 MED ORDER — ALBUTEROL SULFATE (2.5 MG/3ML) 0.083% IN NEBU
2.5000 mg | INHALATION_SOLUTION | RESPIRATORY_TRACT | Status: DC | PRN
  Administered 2019-10-20 (×4): 2.5 mg via RESPIRATORY_TRACT
  Filled 2019-10-18 (×4): qty 3

## 2019-10-18 MED ORDER — ADULT MULTIVITAMIN W/MINERALS CH
1.0000 | ORAL_TABLET | Freq: Every day | ORAL | Status: DC
  Administered 2019-10-19 – 2019-10-26 (×8): 1 via ORAL
  Filled 2019-10-18 (×8): qty 1

## 2019-10-18 MED ORDER — SODIUM CHLORIDE 0.9% FLUSH
3.0000 mL | Freq: Two times a day (BID) | INTRAVENOUS | Status: DC
  Administered 2019-10-19 – 2019-10-26 (×14): 3 mL via INTRAVENOUS

## 2019-10-18 MED ORDER — ACETAMINOPHEN 325 MG PO TABS
650.0000 mg | ORAL_TABLET | ORAL | Status: DC | PRN
  Administered 2019-10-18 – 2019-10-26 (×8): 650 mg via ORAL
  Filled 2019-10-18 (×8): qty 2

## 2019-10-18 MED ORDER — LABETALOL HCL 5 MG/ML IV SOLN
20.0000 mg | INTRAVENOUS | Status: DC | PRN
  Administered 2019-10-18: 20 mg via INTRAVENOUS
  Filled 2019-10-18: qty 4

## 2019-10-18 MED ORDER — MAGNESIUM HYDROXIDE 400 MG/5ML PO SUSP
30.0000 mL | Freq: Every day | ORAL | Status: DC | PRN
  Administered 2019-10-21: 30 mL via ORAL
  Filled 2019-10-18: qty 30

## 2019-10-18 MED ORDER — NIFEDIPINE ER OSMOTIC RELEASE 90 MG PO TB24
90.0000 mg | ORAL_TABLET | Freq: Every day | ORAL | Status: DC
  Administered 2019-10-20: 90 mg via ORAL
  Filled 2019-10-18 (×3): qty 1

## 2019-10-18 MED ORDER — FUROSEMIDE 10 MG/ML IJ SOLN
60.0000 mg | Freq: Two times a day (BID) | INTRAMUSCULAR | Status: DC
  Administered 2019-10-19 – 2019-10-24 (×11): 60 mg via INTRAVENOUS
  Filled 2019-10-18 (×11): qty 8
  Filled 2019-10-18: qty 6

## 2019-10-18 MED ORDER — ONDANSETRON HCL 4 MG/2ML IJ SOLN
4.0000 mg | Freq: Once | INTRAMUSCULAR | Status: AC
  Administered 2019-10-18: 4 mg via INTRAVENOUS

## 2019-10-18 MED ORDER — HYDROXYZINE HCL 25 MG PO TABS
25.0000 mg | ORAL_TABLET | Freq: Four times a day (QID) | ORAL | Status: DC | PRN
  Administered 2019-10-19 – 2019-10-26 (×9): 25 mg via ORAL
  Filled 2019-10-18 (×12): qty 1

## 2019-10-18 MED ORDER — IPRATROPIUM-ALBUTEROL 0.5-2.5 (3) MG/3ML IN SOLN
RESPIRATORY_TRACT | Status: AC
  Administered 2019-10-18: 6 mL
  Filled 2019-10-18: qty 9

## 2019-10-18 MED ORDER — ASCORBIC ACID 500 MG PO TABS
1000.0000 mg | ORAL_TABLET | Freq: Every day | ORAL | Status: DC
  Administered 2019-10-19 – 2019-10-26 (×8): 1000 mg via ORAL
  Filled 2019-10-18 (×8): qty 2

## 2019-10-18 MED ORDER — ALUM & MAG HYDROXIDE-SIMETH 200-200-20 MG/5ML PO SUSP: 15.0000 mL | Freq: Four times a day (QID) | ORAL | Status: DC | PRN

## 2019-10-18 MED ORDER — ALBUTEROL SULFATE HFA 108 (90 BASE) MCG/ACT IN AERS: 2.0000 | INHALATION_SPRAY | Freq: Four times a day (QID) | RESPIRATORY_TRACT | Status: DC | PRN

## 2019-10-18 MED ORDER — ONDANSETRON HCL 4 MG/2ML IJ SOLN
4.0000 mg | Freq: Four times a day (QID) | INTRAMUSCULAR | Status: DC | PRN
  Administered 2019-10-26: 4 mg via INTRAVENOUS
  Filled 2019-10-18: qty 2

## 2019-10-18 MED ORDER — SODIUM CHLORIDE 0.9% FLUSH: 3.0000 mL | INTRAVENOUS | Status: DC | PRN

## 2019-10-18 MED ORDER — METHYLPREDNISOLONE SODIUM SUCC 40 MG IJ SOLR
40.0000 mg | Freq: Three times a day (TID) | INTRAMUSCULAR | Status: DC
  Administered 2019-10-19: 40 mg via INTRAVENOUS
  Filled 2019-10-18: qty 1

## 2019-10-18 MED ORDER — FLUDROCORTISONE ACETATE 0.1 MG PO TABS
0.1000 mg | ORAL_TABLET | Freq: Every day | ORAL | Status: DC
  Administered 2019-10-19 (×2): 0.1 mg via ORAL
  Filled 2019-10-18 (×2): qty 1

## 2019-10-18 MED ORDER — BUPROPION HCL ER (XL) 150 MG PO TB24
150.0000 mg | ORAL_TABLET | Freq: Every day | ORAL | Status: DC
  Administered 2019-10-19 – 2019-10-26 (×8): 150 mg via ORAL
  Filled 2019-10-18 (×8): qty 1

## 2019-10-18 MED ORDER — GUAIFENESIN ER 600 MG PO TB12
600.0000 mg | ORAL_TABLET | Freq: Two times a day (BID) | ORAL | Status: DC
  Administered 2019-10-19 – 2019-10-26 (×16): 600 mg via ORAL
  Filled 2019-10-18 (×16): qty 1

## 2019-10-18 MED ORDER — ASPIRIN EC 81 MG PO TBEC
81.0000 mg | DELAYED_RELEASE_TABLET | Freq: Every day | ORAL | Status: DC
  Administered 2019-10-19 – 2019-10-26 (×8): 81 mg via ORAL
  Filled 2019-10-18 (×8): qty 1

## 2019-10-18 MED ORDER — ENOXAPARIN SODIUM 40 MG/0.4ML ~~LOC~~ SOLN
40.0000 mg | SUBCUTANEOUS | Status: DC
  Administered 2019-10-18: 40 mg via SUBCUTANEOUS
  Filled 2019-10-18: qty 0.4

## 2019-10-18 MED ORDER — VALPROIC ACID 250 MG PO CAPS
500.0000 mg | ORAL_CAPSULE | Freq: Three times a day (TID) | ORAL | Status: DC
  Administered 2019-10-19 – 2019-10-24 (×17): 500 mg via ORAL
  Filled 2019-10-18 (×24): qty 2

## 2019-10-18 MED ORDER — LURASIDONE HCL 40 MG PO TABS
20.0000 mg | ORAL_TABLET | Freq: Two times a day (BID) | ORAL | Status: DC
  Administered 2019-10-19 – 2019-10-26 (×16): 20 mg via ORAL
  Filled 2019-10-18 (×20): qty 1

## 2019-10-18 MED ORDER — SPIRONOLACTONE 25 MG PO TABS
25.0000 mg | ORAL_TABLET | Freq: Every day | ORAL | Status: DC
  Administered 2019-10-19 – 2019-10-21 (×3): 25 mg via ORAL
  Filled 2019-10-18 (×4): qty 1

## 2019-10-18 MED ORDER — NICOTINE 21 MG/24HR TD PT24
21.0000 mg | MEDICATED_PATCH | Freq: Every day | TRANSDERMAL | Status: DC
  Administered 2019-10-19 – 2019-10-26 (×8): 21 mg via TRANSDERMAL
  Filled 2019-10-18 (×8): qty 1

## 2019-10-18 MED ORDER — ONDANSETRON HCL 4 MG PO TABS
8.0000 mg | ORAL_TABLET | Freq: Four times a day (QID) | ORAL | Status: DC | PRN
  Administered 2019-10-20 – 2019-10-21 (×2): 8 mg via ORAL
  Filled 2019-10-18 (×2): qty 2

## 2019-10-18 MED ORDER — SUCRALFATE 1 GM/10ML PO SUSP
1.0000 g | Freq: Three times a day (TID) | ORAL | Status: DC
  Administered 2019-10-19 – 2019-10-26 (×26): 1 g via ORAL
  Filled 2019-10-18 (×34): qty 10

## 2019-10-18 MED ORDER — ALUM & MAG HYDROXIDE-SIMETH 200-200-20 MG/5ML PO SUSP
30.0000 mL | ORAL | Status: DC | PRN
  Administered 2019-10-20: 30 mL via ORAL
  Filled 2019-10-18: qty 30

## 2019-10-18 MED ORDER — SALINE SPRAY 0.65 % NA SOLN
2.0000 | NASAL | Status: DC | PRN
  Filled 2019-10-18: qty 44

## 2019-10-18 MED ORDER — OLANZAPINE 5 MG PO TABS
10.0000 mg | ORAL_TABLET | Freq: Two times a day (BID) | ORAL | Status: DC
  Administered 2019-10-18 – 2019-10-26 (×15): 10 mg via ORAL
  Filled 2019-10-18 (×3): qty 2
  Filled 2019-10-18: qty 1
  Filled 2019-10-18 (×3): qty 2
  Filled 2019-10-18: qty 1
  Filled 2019-10-18 (×4): qty 2
  Filled 2019-10-18: qty 1
  Filled 2019-10-18 (×4): qty 2

## 2019-10-18 MED ORDER — SODIUM CHLORIDE 0.9 % IV SOLN
1.0000 g | INTRAVENOUS | Status: DC
  Administered 2019-10-19: 1 g via INTRAVENOUS
  Filled 2019-10-18 (×2): qty 10

## 2019-10-18 MED ORDER — LORATADINE 10 MG PO TABS
10.0000 mg | ORAL_TABLET | Freq: Every day | ORAL | Status: DC
  Administered 2019-10-19 – 2019-10-26 (×8): 10 mg via ORAL
  Filled 2019-10-18 (×8): qty 1

## 2019-10-18 MED ORDER — CALCIUM CARBONATE-VITAMIN D 500-200 MG-UNIT PO TABS
1.0000 | ORAL_TABLET | Freq: Every day | ORAL | Status: DC
  Administered 2019-10-19 – 2019-10-26 (×8): 1 via ORAL
  Filled 2019-10-18 (×9): qty 1

## 2019-10-18 NOTE — ED Notes (Signed)
Purewick external urinary catheter placed

## 2019-10-18 NOTE — ED Triage Notes (Signed)
Pt arrives on cpap in resp distress. Pt given 2 duonebs, 125mg  solumedrol by ems, 92% pox on cpap. md at bedside.

## 2019-10-18 NOTE — ED Provider Notes (Signed)
[facility] Emergency Department Provider Note   ____________________________________________   First MD Initiated Contact with Patient 10/18/19 1958     (approximate)  I have reviewed the triage vital signs and the nursing notes.   HISTORY  Chief Complaint Respiratory Distress    HPI Natalie Thomas is a 59 y.o. female with past medical history of schizophrenia, psychogenic polydipsia, CHF on 4 L nasal cannula, hypertension, diabetes who presents to the ED for shortness of breath.  History is limited due to patient's respiratory distress.  Per EMS, she has been having increasing difficulty breathing over the past couple of days, staff was not sure if she was drinking more water than usual.  EMS found patient sitting upright on her bed giving herself a breathing treatment, sats noted to be in the 70s on her usual supplemental oxygen.  CPAP was placed and patient was given 2 duo nebs with slight improvement.  Patient denies any recent fevers or chest pain.        Past Medical History:  Diagnosis Date  . Anemia 10/16/2017  . Anxiety and depression   . CHF (congestive heart failure) ([place])   . Chronic hyponatremia 04/08/2007   Qualifier: Diagnosis of  By: Marca Ancona RMA, Lucy    . Closed comminuted intertrochanteric fracture of right femur ([place]) 11/07/2017  . COPD 04/08/2007   Qualifier: Diagnosis of  By: Marca Ancona RMA, Lucy    . Depression   . Diabetes mellitus, type 2 ([place])    pt denies but states that she has been treated for DM  . Essential hypertension 04/08/2007   Qualifier: Diagnosis of  By: Marca Ancona RMA, Lucy    . GERD (gastroesophageal reflux disease)   . Hyperlipidemia 10/07/2017  . Lumbar disc disease 04/09/2011  . MENOPAUSE, PREMATURE 04/08/2007   Qualifier: Diagnosis of  By: Marca Ancona RMA, Lucy    . 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 Osteoporosis 04/08/2007   Qualifier: Diagnosis of  By: Reatha Armour, Lucy    . Polycythemia secondary to smoking 04/09/2011  . 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 Schizoaffective disorder, bipolar type ([place]) 07/05/2011    Patient Active Problem List   Diagnosis Date Noted  . Acute respiratory failure ([place]) 10/18/2019  . DNR (do not resuscitate) discussion   . Goals of care, counseling/discussion   . Palliative care by specialist   . Acute on chronic respiratory failure ([place]) 10/01/2019  . Acute on chronic diastolic CHF (congestive heart failure) ([place])   . Shortness of breath 09/22/2019  . Acute on chronic respiratory failure with hypercapnia ([place]) 09/13/2019  . Leukocytosis 09/13/2019  . Class 2 obesity with body mass index (BMI) of 39.0 to 39.9 in adult   . Acute on chronic respiratory failure with hypoxemia ([place]) 09/04/2019  . COVID-19 virus infection   . 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 Nausea   . Acute gastritis   . Abdominal pain, chronic, epigastric   . Hyponatremia 07/09/2019  . Ventral hernia 07/09/2019  . Hypertensive crisis 05/01/2019  . Acute abdominal pain 03/15/2019  . Pleural effusion 03/13/2019  . Atelectasis 03/13/2019  . Acute on chronic combined systolic and diastolic CHF (congestive heart failure) ([place]) 03/13/2019  . Type 2 diabetes mellitus without complication ([place]) [id number]  . Urinary tract bacterial infections 03/09/2019  . Acute cystitis without hematuria   . Constipation   . Acute hypoxemic respiratory failure ([place]) 12/03/2018  . Irritability 11/09/2018  .  Aggressive behavior 11/09/2018  . Agitation   . S/P right hip fracture 11/07/2017  . Closed comminuted intertrochanteric fracture of right femur ([place]) 11/07/2017  . Anxiety and depression   . Dyspnea 10/16/2017  . Anemia 10/16/2017  . COPD exacerbation ([place]) 10/07/2017  . Hyperlipidemia 10/07/2017  . Acute on chronic respiratory failure with hypoxia and hypercapnia ([place]) 10/07/2017  . Incontinence of urine 08/14/2011  . Schizoaffective disorder,  bipolar type ([place]) 07/05/2011  . Cough 05/08/2011  . Lumbar disc disease 04/09/2011  . Polycythemia secondary to smoking 04/09/2011  . HIP PAIN, LEFT 12/12/2007  . 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 DM (diabetes mellitus), type 2 ([place]) 04/08/2007  . MENOPAUSE, PREMATURE 04/08/2007  . Chronic hyponatremia 04/08/2007  . Schizoaffective disorder ([place]) 04/08/2007  . Essential hypertension 04/08/2007  . COPD (chronic obstructive pulmonary disease) ([place]) 04/08/2007  . GERD 04/08/2007  . Osteoporosis 04/08/2007    Past Surgical History:  Procedure Laterality Date  . BIOPSY  07/11/2019   Procedure: BIOPSY;  Surgeon: Jerene Bears, MD;  Location: [facility] ENDOSCOPY;  Service: Gastroenterology;;  . ESOPHAGOGASTRODUODENOSCOPY (EGD) WITH PROPOFOL N/A 07/11/2019   Procedure: ESOPHAGOGASTRODUODENOSCOPY (EGD) WITH PROPOFOL;  Surgeon: Jerene Bears, MD;  Location: [facility];  Service: Gastroenterology;  Laterality: N/A;  . EYE SURGERY     on left  eye, pt states that she sees bad out of the right eye and needs surgery there  . INTRAMEDULLARY (IM) NAIL INTERTROCHANTERIC Right 11/13/2017   Procedure: INTRAMEDULLARY (IM) NAIL INTERTROCHANTRIC;  Surgeon: Shona Needles, MD;  Location: [facility];  Service: Orthopedics;  Laterality: Right;  . PELVIC FRACTURE SURGERY      Prior to Admission medications   Medication Sig Start Date End Date Taking? Authorizing Provider  acetaminophen (TYLENOL) 500 MG tablet Take 1,000 mg by mouth every 8 (eight) hours as needed for mild pain or moderate pain. Not to exceed 3g/24h of tylenol from all sources.    [provider]  albuterol (PROVENTIL) (2.5 MG/3ML) 0.083% nebulizer solution Take 3 mLs (2.5 mg total) by nebulization every 4 (four) hours as needed for wheezing or shortness of breath. 12/11/18   Swayze, Ava, DO  albuterol (VENTOLIN HFA) 108 (90 Base) MCG/ACT inhaler Inhale 2 puffs into the lungs every 6 (six) hours as needed for wheezing or shortness of  breath.    [provider]  Alum & Mag Hydroxide-Simeth (ANTACID LIQUID PO) Take 30 mLs by mouth every 6 (six) hours as needed (heartburn).    [provider]  ascorbic acid (VITAMIN C) 1000 MG tablet Take 1 tablet (1,000 mg total) by mouth daily. 09/17/19   Lorella Nimrod, MD  buPROPion (WELLBUTRIN XL) 150 MG 24 hr tablet Take 150 mg by mouth daily.    [provider]  Calcium Carb-Cholecalciferol 600-400 MG-UNIT TABS Take 1 tablet by mouth daily.    [provider]  clonazePAM (KLONOPIN) 1 MG tablet Take 1 tablet (1 mg total) by mouth 2 (two) times daily. 10/05/19   Fritzi Mandes, MD  docusate sodium (COLACE) 100 MG capsule Take 100 mg by mouth 2 (two) times daily.     [provider]  feeding supplement, ENSURE ENLIVE, (ENSURE ENLIVE) LIQD Take 237 mLs by mouth daily. 03/14/19   Desiree Hane, MD  fludrocortisone (FLORINEF) 0.1 MG tablet Take 1 tablet (0.1 mg total) by mouth daily. 07/17/19   Mercy Riding, MD  fluticasone furoate-vilanterol (BREO ELLIPTA) 100-25 MCG/INH AEPB Inhale 1 puff into the lungs daily.  [provider]  furosemide (LASIX) 40 MG tablet Take 1 tablet (40 mg total) by mouth daily. 09/16/19   Lorella Nimrod, MD  hydrOXYzine (ATARAX/VISTARIL) 25 MG tablet Take 25 mg by mouth every 6 (six) hours as needed for anxiety or itching.    [provider]  loperamide (IMODIUM) 2 MG capsule Take 2 mg by mouth every 6 (six) hours as needed for diarrhea or loose stools.  06/28/19   [provider]  loratadine (CLARITIN) 10 MG tablet Take 10 mg by mouth daily.    [provider]  lurasidone (LATUDA) 20 MG TABS tablet Take 1 tablet (20 mg total) by mouth 2 (two) times daily. 05/15/19   Hongalgi, Lenis Dickinson, MD  Melatonin 5 MG TABS Take 5 mg by mouth at bedtime.    [provider]  Multiple Vitamin (MULTIVITAMIN WITH MINERALS) TABS tablet Take 1 tablet by mouth daily. 03/15/19   Desiree Hane, MD    neomycin-bacitracin-polymyxin (NEOSPORIN) ointment Apply 1 application topically as needed for wound care.    [provider]  nicotine (NICODERM CQ - DOSED IN MG/24 HOURS) 21 mg/24hr patch Place 21 mg onto the skin daily.     [provider]  NIFEdipine (PROCARDIA XL/ADALAT-CC) 90 MG 24 hr tablet Take 90 mg by mouth daily.    [provider]  nystatin (NYSTATIN) powder Apply 1 application topically 2 (two) times daily as needed (irritation). Apply topically to reddened area twice daily as needed for irritation    [provider]  OLANZapine (ZYPREXA) 10 MG tablet Take 10 mg by mouth 2 (two) times daily.    [provider]  ondansetron (ZOFRAN) 8 MG tablet Take 8 mg by mouth every 6 (six) hours as needed for nausea or vomiting.    [provider]  pantoprazole (PROTONIX) 40 MG tablet Take 1 tablet (40 mg total) by mouth daily. 07/16/19 01/12/20  Mercy Riding, MD  polyethylene glycol (MIRALAX) 17 g packet Take 17 g by mouth daily. Patient taking differently: Take 17 g by mouth daily. Mix 1 capful (17 g) in 4-8 oz of water or juice and give by mouth once daily for bowel regimen 12/11/18   Swayze, Ava, DO  sodium chloride (OCEAN) 0.65 % SOLN nasal spray Place 2 sprays into both nostrils every 2 (two) hours as needed (dryness, irritation).    [provider]  spironolactone (ALDACTONE) 25 MG tablet Take 1 tablet (25 mg total) by mouth daily. 09/28/19   Jennye Boroughs, MD  sucralfate (CARAFATE) 1 GM/10ML suspension Take 10 mLs (1 g total) by mouth 4 (four) times daily -  with meals and at bedtime. 07/16/19   Mercy Riding, MD  valproic acid (DEPAKENE) 250 MG capsule Take 2 capsules (500 mg total) by mouth 3 (three) times daily. 09/08/19   Fritzi Mandes, MD    Allergies Clarithromycin and Penicillins  History reviewed. No pertinent family history.  Social History Social History   Tobacco Use  . Smoking status: Former Smoker    Packs/day:  0.25    Types: Cigarettes    Quit date: 07/23/2019    Years since quitting: 0.2  . Smokeless tobacco: Never Used  Substance Use Topics  . Alcohol use: No  . Drug use: No    Review of Systems  Constitutional: No fever/chills Eyes: No visual changes. ENT: No sore throat. Cardiovascular: Denies chest pain. Respiratory: Positive for cough and shortness of breath. Gastrointestinal: No abdominal pain.  No nausea, no  vomiting.  No diarrhea.  No constipation. Genitourinary: Negative for dysuria. Musculoskeletal: Negative for back pain. Skin: Negative for rash. Neurological: Negative for headaches, focal weakness or numbness.  ____________________________________________   PHYSICAL EXAM:  VITAL SIGNS: ED Triage Vitals  Enc Vitals Group     BP 10/18/19 1956 (!) 209/106     Pulse Rate 10/18/19 1956 (!) 120     Resp 10/18/19 1954 (!) 36     Temp --      Temp src --      SpO2 10/18/19 1956 100 %     Weight 10/18/19 1955 242 lb 8.1 oz (110 kg)     Height 10/18/19 1955 5\' 1"  (1.549 m)     Head Circumference --      Peak Flow --      Pain Score --      Pain Loc --      Pain Edu? --      Excl. in [place]? --     Constitutional: Alert and oriented. Eyes: Conjunctivae are normal. Head: Atraumatic. Nose: No congestion/rhinnorhea. Mouth/Throat: Mucous membranes are moist. Neck: Normal ROM Cardiovascular: Tachycardic, regular rhythm. Grossly normal heart sounds. Respiratory: Moderate respiratory distress with tachypnea and accessory muscle use.  Crackles to bilateral bases with end expiratory wheezing. Gastrointestinal: Soft and nontender. No distention. Genitourinary: deferred Musculoskeletal: 2+ pitting edema to bilateral lower extremities, no calf tenderness noted. Neurologic:  Normal speech and language. No gross focal neurologic deficits are appreciated. Skin:  Skin is warm, dry and intact. No rash noted. Psychiatric: Mood and affect are normal. Speech and behavior are  normal.  ____________________________________________   LABS (all labs ordered are listed, but only abnormal results are displayed)  Labs Reviewed  BASIC METABOLIC PANEL - Abnormal; Notable for the following components:      Result Value   Sodium 124 (*)    Chloride 83 (*)    Glucose, Bld 128 (*)    Calcium 8.5 (*)    All other components within normal limits  CBC WITH DIFFERENTIAL/PLATELET - Abnormal; Notable for the following components:   WBC 11.0 (*)    RBC 3.67 (*)    Hemoglobin 10.6 (*)    HCT 33.3 (*)    Neutro Abs 8.7 (*)    Monocytes Absolute 1.1 (*)    Abs Immature Granulocytes 0.12 (*)    All other components within normal limits  BRAIN NATRIURETIC PEPTIDE - Abnormal; Notable for the following components:   B Natriuretic Peptide 580.0 (*)    All other components within normal limits  BLOOD GAS, VENOUS - Abnormal; Notable for the following components:   pCO2, Ven 77 (*)    Bicarbonate 40.6 (*)    Acid-Base Excess 11.3 (*)    All other components within normal limits  TROPONIN I (HIGH SENSITIVITY) - Abnormal; Notable for the following components:   Troponin I (High Sensitivity) 35 (*)    All other components within normal limits  RESPIRATORY PANEL BY RT PCR (FLU A&B, COVID)   ____________________________________________  EKG  ED ECG REPORT I, Blake Divine, the attending physician, personally viewed and interpreted this ECG.   Date: 10/18/2019  EKG Time: 19:59  Rate: 111  Rhythm: sinus tachycardia  Axis: Normal  Intervals:none  ST&T Change: None   PROCEDURES  Procedure(s) performed (including Critical Care):  .Critical Care Performed by: Blake Divine, MD Authorized by: Blake Divine, MD   Critical care provider statement:    Critical care time (minutes):  45   Critical  care time was exclusive of:  Separately billable procedures and treating other patients and teaching time   Critical care was necessary to treat or prevent imminent or  life-threatening deterioration of the following conditions:  Respiratory failure   Critical care was time spent personally by me on the following activities:  Discussions with consultants, evaluation of patient's response to treatment, examination of patient, ordering and performing treatments and interventions, ordering and review of laboratory studies, ordering and review of radiographic studies, pulse oximetry, re-evaluation of patient's condition, obtaining history from patient or surrogate and review of old charts   I assumed direction of critical care for this patient from another provider in my specialty: no   .1-3 Lead EKG Interpretation Performed by: Blake Divine, MD Authorized by: Blake Divine, MD     Interpretation: normal     ECG rate:  101   ECG rate assessment: tachycardic     Rhythm: sinus tachycardia     Ectopy: none     Conduction: normal       ____________________________________________   INITIAL IMPRESSION / ASSESSMENT AND PLAN / ED COURSE       59 year old female with history of schizophrenia, CHF, COP, and psychogenic polydipsia presents to the ED for shortness of breath and respiratory distress, placed on CPAP by EMS prior to arrival.  She arrives in ongoing respiratory distress with increased respiratory effort and accessory muscle usage.  She was placed on BiPAP and given additional nebs, she did receive steroids prior to arrival.  She does appear fluid overloaded and chest x-ray consistent with pulmonary edema, we will give dose of IV Lasix.  CO2 noted to be elevated on VBG, however this is likely chronic given her relatively normal pH.  Remainder of labs are unremarkable and case was discussed with hospitalist for admission.  Overall, her respiratory effort is improving on BiPAP.      ____________________________________________   FINAL CLINICAL IMPRESSION(S) / ED DIAGNOSES  Final diagnoses:  Acute respiratory failure with hypoxia and hypercapnia  (HCC)  Acute on chronic combined systolic and diastolic congestive heart failure ([place])  Hyponatremia     ED Discharge Orders    None       Note:  This document was prepared using Dragon voice recognition software and may include unintentional dictation errors.   Blake Divine, MD 10/18/19 2112

## 2019-10-18 NOTE — H&P (Addendum)
[facility] [place] NAME: Natalie Thomas    MR#:  [id number]  DATE OF BIRTH:  [birth year]  DATE OF ADMISSION:  10/18/2019  PRIMARY CARE PHYSICIAN: System, Pcp Not In   REQUESTING/REFERRING PHYSICIAN: Blake Divine, MD  CHIEF COMPLAINT:   Chief Complaint  Patient presents with  . Respiratory Distress    HISTORY OF PRESENT ILLNESS:  Natalie Thomas  is a 59 y.o. 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  She was feeling warm but did not have any reported fever or chills.  She felt chest heaviness and denied any palpitations.  No hemoptysis or other bleeding diathesis.  She admitted to nausea without vomiting or abdominal pain or diarrhea.  No dysuria, oliguria or hematuria or flank pain.  Upon presentation to the emergency room, blood pressure was 200/106 with a heart rate of 120, respiratory to 21 and pulse oximetry was 100% on BiPAP.  Labs revealed hyponatremia 124-we will chloremia beginning compared to 129 and 86 on 4/11 and BNP of 580 and a high-sensitivity troponin I of 35.  CBC showed WBCs of 11 with hemoglobin of 10.6 and hematocrit 33.3, lower than previous levels from 10/01/2019.  EKG showed sinus tachycardia with a rate of 111 with PACs and poor R wave progression.  Formal chest x-ray showed cardiomegaly with findings of CHF and probable small bilateral pleural effusions with pneumonia not excluded.  Respiratory panel is currently pending including influenza antigens and COVID-19 PCR.  The patient was given 80 mg of IV Lasix and 4 mg of IV Zofran.  She will be admitted to a stepdown unit bed for further evaluation and management.  PAST MEDICAL  HISTORY:   Past Medical History:  Diagnosis Date  . Anemia 10/16/2017  . Anxiety and depression   . CHF (congestive heart failure) ([place])   . Chronic hyponatremia 04/08/2007   Qualifier: Diagnosis of  By: Marca Ancona RMA, Lucy    . Closed comminuted intertrochanteric fracture of right femur ([place]) 11/07/2017  . COPD 04/08/2007   Qualifier: Diagnosis of  By: Marca Ancona RMA, Lucy    . Depression   . Diabetes mellitus, type 2 ([place])    pt denies but states that she has been treated for DM  . Essential hypertension 04/08/2007   Qualifier: Diagnosis of  By: Marca Ancona RMA, Lucy    . GERD (gastroesophageal reflux disease)   . Hyperlipidemia 10/07/2017  . Lumbar disc disease 04/09/2011  . MENOPAUSE, PREMATURE 04/08/2007   Qualifier: Diagnosis of  By: Marca Ancona RMA, Lucy    . 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 Osteoporosis 04/08/2007   Qualifier: Diagnosis of  By: Reatha Armour, Lucy    . Polycythemia secondary to smoking 04/09/2011  . 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 Schizoaffective disorder, bipolar type ([place]) 07/05/2011    PAST SURGICAL HISTORY:   Past Surgical History:  Procedure Laterality Date  . BIOPSY  07/11/2019   Procedure: BIOPSY;  Surgeon: Jerene Bears, MD;  Location: [facility] ENDOSCOPY;  Service: Gastroenterology;;  . ESOPHAGOGASTRODUODENOSCOPY (EGD) WITH PROPOFOL N/A 07/11/2019   Procedure: ESOPHAGOGASTRODUODENOSCOPY (EGD) WITH PROPOFOL;  Surgeon: Jerene Bears, MD;  Location: [facility];  Service: Gastroenterology;  Laterality: N/A;  . EYE SURGERY     on left  eye, pt states that she sees bad out of the right eye and needs surgery there  . INTRAMEDULLARY (IM) NAIL INTERTROCHANTERIC Right 11/13/2017   Procedure: INTRAMEDULLARY (IM) NAIL INTERTROCHANTRIC;  Surgeon: Shona Needles, MD;  Location: [facility];  Service: Orthopedics;  Laterality: Right;  . PELVIC FRACTURE SURGERY      SOCIAL HISTORY:   Social History   Tobacco Use  . Smoking  status: Former Smoker    Packs/day: 0.25    Types: Cigarettes    Quit date: 07/23/2019    Years since quitting: 0.2  . Smokeless tobacco: Never Used  Substance Use Topics  . Alcohol use: No    FAMILY HISTORY:  History reviewed. No pertinent family history.  DRUG ALLERGIES:   Allergies  Allergen Reactions  . Clarithromycin Itching  . Penicillins Itching    Has tolerated cefazolin before  Has patient had a PCN reaction causing immediate rash, facial/tongue/throat swelling, SOB or lightheadedness with hypotension: No Has patient had a PCN reaction causing severe rash involving mucus membranes or skin necrosis: No Has patient had a PCN reaction that required hospitalization: Unknown Has patient had a PCN reaction occurring within the last 10 years: No If all of the above answers are "NO", then may proceed with Cephalosporin use.     REVIEW OF SYSTEMS:   ROS As per history of present illness. All pertinent systems were reviewed above. Constitutional,  HEENT, cardiovascular, respiratory, GI, GU, musculoskeletal, neuro, psychiatric, endocrine,  integumentary and hematologic systems were reviewed and are otherwise  negative/unremarkable except for positive findings mentioned above in the HPI.   MEDICATIONS AT HOME:   Prior to Admission medications   Medication Sig Start Date End Date Taking? Authorizing Provider  acetaminophen (TYLENOL) 500 MG tablet Take 1,000 mg by mouth every 8 (eight) hours as needed for mild pain or moderate pain. Not to exceed 3g/24h of tylenol from all sources.    [provider]  albuterol (PROVENTIL) (2.5 MG/3ML) 0.083% nebulizer solution Take 3 mLs (2.5 mg total) by nebulization every 4 (four) hours as needed for wheezing or shortness of breath. 12/11/18   Swayze, Ava, DO  albuterol (VENTOLIN HFA) 108 (90 Base) MCG/ACT inhaler Inhale 2 puffs into the lungs every 6 (six) hours as needed for wheezing or shortness of breath.    [provider]  Alum & Mag Hydroxide-Simeth (ANTACID LIQUID PO) Take 30 mLs by mouth every 6 (six) hours as needed (heartburn).    [provider]  ascorbic acid (VITAMIN C) 1000 MG tablet Take 1 tablet (1,000 mg total) by mouth daily. 09/17/19   Lorella Nimrod, MD  buPROPion (WELLBUTRIN XL) 150 MG 24 hr tablet Take 150 mg by mouth daily.    [provider]  Calcium Carb-Cholecalciferol 600-400 MG-UNIT TABS Take 1 tablet by mouth daily.    [provider]  clonazePAM (KLONOPIN) 1 MG tablet Take 1 tablet (1 mg total) by mouth 2 (two) times daily. 10/05/19   Fritzi Mandes, MD  docusate sodium (COLACE) 100 MG capsule Take 100 mg by mouth 2 (two) times daily.     [provider]  feeding supplement, ENSURE ENLIVE, (ENSURE ENLIVE) LIQD Take 237 mLs by mouth daily. 03/14/19   Desiree Hane, MD  fludrocortisone (FLORINEF) 0.1 MG tablet Take 1 tablet (0.1 mg total) by mouth daily. 07/17/19   Mercy Riding, MD  fluticasone furoate-vilanterol (BREO ELLIPTA) 100-25 MCG/INH AEPB  Inhale 1 puff into the lungs daily.    [provider]  furosemide (LASIX) 40 MG tablet Take 1 tablet (40 mg total) by mouth daily. 09/16/19   Lorella Nimrod, MD  hydrOXYzine (ATARAX/VISTARIL) 25 MG tablet Take 25 mg by mouth every 6 (six) hours as needed for anxiety or itching.    [provider]  loperamide (IMODIUM) 2 MG capsule Take 2 mg by mouth every 6 (six) hours as needed for diarrhea or loose stools.  06/28/19   [provider]  loratadine (CLARITIN) 10 MG tablet Take 10 mg by mouth daily.    [provider]  lurasidone (LATUDA) 20 MG TABS tablet Take 1 tablet (20 mg total) by mouth 2 (two) times daily. 05/15/19   Hongalgi, Lenis Dickinson, MD  Melatonin 5 MG TABS Take 5 mg by mouth at bedtime.    [provider]  Multiple Vitamin (MULTIVITAMIN WITH MINERALS) TABS tablet Take 1 tablet by mouth daily. 03/15/19   Desiree Hane, MD  neomycin-bacitracin-polymyxin (NEOSPORIN)  ointment Apply 1 application topically as needed for wound care.    [provider]  nicotine (NICODERM CQ - DOSED IN MG/24 HOURS) 21 mg/24hr patch Place 21 mg onto the skin daily.     [provider]  NIFEdipine (PROCARDIA XL/ADALAT-CC) 90 MG 24 hr tablet Take 90 mg by mouth daily.    [provider]  nystatin (NYSTATIN) powder Apply 1 application topically 2 (two) times daily as needed (irritation). Apply topically to reddened area twice daily as needed for irritation    [provider]  OLANZapine (ZYPREXA) 10 MG tablet Take 10 mg by mouth 2 (two) times daily.    [provider]  ondansetron (ZOFRAN) 8 MG tablet Take 8 mg by mouth every 6 (six) hours as needed for nausea or vomiting.    [provider]  pantoprazole (PROTONIX) 40 MG tablet Take 1 tablet (40 mg total) by mouth daily. 07/16/19 01/12/20  Mercy Riding, MD  polyethylene glycol (MIRALAX) 17 g packet Take 17 g by mouth daily. Patient taking differently: Take 17 g by mouth daily. Mix 1 capful (17 g) in 4-8 oz of water or juice and give by mouth once daily for bowel regimen 12/11/18   Swayze, Ava, DO  sodium chloride (OCEAN) 0.65 % SOLN nasal spray Place 2 sprays into both nostrils every 2 (two) hours as needed (dryness, irritation).    [provider]  spironolactone (ALDACTONE) 25 MG tablet Take 1 tablet (25 mg total) by mouth daily. 09/28/19   Jennye Boroughs, MD  sucralfate (CARAFATE) 1 GM/10ML suspension Take 10 mLs (1 g total) by mouth 4 (four) times daily -  with meals and at bedtime. 07/16/19   Mercy Riding, MD  valproic acid (DEPAKENE) 250 MG capsule Take 2 capsules (500 mg total) by mouth 3 (three) times daily. 09/08/19   Fritzi Mandes, MD      VITAL SIGNS:  Blood pressure (!) 215/92, pulse (!) 120, temperature 98.1 F (36.7 C), temperature source Axillary, resp. rate (!) 36, height 5\' 1"  (1.549 m), weight 110 kg, SpO2 100 %.  PHYSICAL EXAMINATION:  Physical  Exam  GENERAL:  59 y.o.-year-old female patient lying in the bed with mild respiratory distress on BiPAP. EYES: Pupils equal, round, reactive to light and accommodation. No scleral icterus. Extraocular muscles intact.  HEENT: Head atraumatic, normocephalic. Oropharynx and nasopharynx clear.  NECK:  Supple, no jugular venous distention. No thyroid enlargement, no tenderness.  LUNGS: Diminished  bibasal breath sounds with bibasal rales and diffuse expiratory wheezes with diminished expiratory airflow. CARDIOVASCULAR: Regular rate and rhythm, S1, S2 normal. No murmurs, rubs, or gallops.  ABDOMEN: Soft, nondistended, nontender. Bowel sounds present. No organomegaly or mass.  EXTREMITIES: 1-2+ bilateral lower extremity pitting edema, with no cyanosis, or clubbing.  NEUROLOGIC: Cranial nerves II through XII are intact. Muscle strength 5/5 in all extremities. Sensation intact. Gait not checked.  PSYCHIATRIC: The patient is alert and oriented x 3.  Normal affect and good eye contact. SKIN: No obvious rash, lesion, or ulcer.   LABORATORY PANEL:   CBC Recent Labs  Lab 10/18/19 2002  WBC 11.0*  HGB 10.6*  HCT 33.3*  PLT 162   ------------------------------------------------------------------------------------------------------------------  Chemistries  Recent Labs  Lab 10/18/19 2002  NA 124*  K 4.0  CL 83*  CO2 30  GLUCOSE 128*  BUN 13  CREATININE 0.78  CALCIUM 8.5*   ------------------------------------------------------------------------------------------------------------------  Cardiac Enzymes No results for input(s): TROPONINI in the last 168 hours. ------------------------------------------------------------------------------------------------------------------  RADIOLOGY:  DG Chest Portable 1 View  Result Date: 10/18/2019 CLINICAL DATA:  59 year old female with shortness of breath. EXAM: PORTABLE CHEST 1 VIEW COMPARISON:  Chest radiograph dated 10/01/2019. FINDINGS:  Cardiomegaly with vascular congestion and edema. Probable small bilateral pleural effusions and bibasilar atelectasis or infiltrate. No pneumothorax. Atherosclerotic calcification of the aorta. No acute osseous pathology. IMPRESSION: Cardiomegaly with findings of CHF and probable small bilateral pleural effusions. Pneumonia is not excluded. Electronically Signed   By: Anner Crete M.D.   On: 10/18/2019 20:14      IMPRESSION AND PLAN:   1.  Acute on chronic systolic and diastolic CHF with subsequent acute respiratory failure requiring BiPAP. -The patient will be admitted to the stepdown unit bed. -BiPAP will be continued. -We will continue diuresis with IV Lasix and continue Aldactone. -Most recent 2D echo was on 09/14/2019 revealing an EF of 40 to 45% with grade 3 diastolic dysfunction, circumferential pericardial effusion and large pleural effusion with trace mitral regurgitation. -Cardiology consult to be obtained in a.m. -I notified Dr. Humphrey Rolls about the patient.  2.  Hypertensive urgency. -This could be the culprit for #1. -We will optimize blood pressure control. -Patient will be placed on as needed IV labetalol and will continue her antihypertensives.  3.  COPD exacerbation, contributing to her acute respiratory failure. -We will place her on scheduled and as needed duo nebs, IV steroid therapy as well as IV antibiotic therapy with Rocephin and Zithromax. -We will follow blood culture as well as sputum culture.  4.  Hyponatremia and hypokalemia. -This is likely hypovolemic from psychogenic polydipsia. -Hyponatremia work-up will be obtained. -We will follow BMPs.  5.  Psychogenic polydipsia. -This could be another culprit for #1. -The patient will have fluid restriction. -We will follow her sodium and chloride.  6.  Schizoaffective bipolar disorder. -We will continue Zyprexa, Depakene, Latuda and Wellbutrin XL as well as Klonopin.  7.  Type 2 diabetes mellitus. -The patient  will be placed on supplemental coverage with NovoLog.  8.  DVT prophylaxis. -Subcutaneous Lovenox.  All the records are reviewed and case discussed with ED provider. The plan of care was discussed in details with the patient (and family). I answered all questions. The patient agreed to proceed with the above mentioned plan. Further management will depend upon hospital course.   CODE STATUS: Full code  Status is: Inpatient  Remains inpatient appropriate because:Hemodynamically unstable, Ongoing diagnostic testing needed not appropriate for outpatient work up, Unsafe  d/c plan, IV treatments appropriate due to intensity of illness or inability to take PO and Inpatient level of care appropriate due to severity of illness   Dispo: The patient is from: Home              Anticipated d/c is to: Home              Anticipated d/c date is: 2 days              Patient currently is not medically stable to d/c.  Plan  TOTAL TIME TAKING CARE OF THIS PATIENT: 55 minutes.    Christel Mormon M.D on 10/18/2019 at 9:29 PM  [facility]   From 7 PM-7 AM, contact night-coverage [email or web address]  CC: Primary care physician; System, Pcp Not In   Note: This dictation was prepared with Dragon dictation along with smaller phrase technology. Any transcriptional errors that result from this process are unintentional.

## 2019-10-19 DIAGNOSIS — J9601 Acute respiratory failure with hypoxia: Secondary | ICD-10-CM

## 2019-10-19 DIAGNOSIS — E871 Hypo-osmolality and hyponatremia: Secondary | ICD-10-CM

## 2019-10-19 DIAGNOSIS — J9602 Acute respiratory failure with hypercapnia: Secondary | ICD-10-CM

## 2019-10-19 LAB — BLOOD CULTURE ID PANEL (REFLEXED)

## 2019-10-19 LAB — BASIC METABOLIC PANEL
Anion gap: 11 (ref 5–15)
BUN: 15 mg/dL (ref 6–20)
CO2: 33 mmol/L — ABNORMAL HIGH (ref 22–32)
Calcium: 8.5 mg/dL — ABNORMAL LOW (ref 8.9–10.3)
Chloride: 81 mmol/L — ABNORMAL LOW (ref 98–111)
Creatinine, Ser: 0.68 mg/dL (ref 0.44–1.00)
GFR calc Af Amer: >60 mL/min (ref >60)
GFR calc non Af Amer: >60 mL/min (ref >60)
Glucose, Bld: 166 mg/dL — ABNORMAL HIGH (ref 70–99)
Potassium: 4.8 mmol/L (ref 3.5–5.1)
Sodium: 125 mmol/L — ABNORMAL LOW (ref 135–145)

## 2019-10-19 LAB — CBC WITH DIFFERENTIAL/PLATELET
Abs Immature Granulocytes: 0.05 10*3/uL (ref 0.00–0.07)
Basophils Absolute: 0 10*3/uL (ref 0.0–0.1)
Basophils Relative: 0 %
Eosinophils Absolute: 0 10*3/uL (ref 0.0–0.5)
Eosinophils Relative: 0 %
HCT: 29.3 % — ABNORMAL LOW (ref 36.0–46.0)
Hemoglobin: 9.9 g/dL — ABNORMAL LOW (ref 12.0–15.0)
Immature Granulocytes: 1 %
Lymphocytes Relative: 5 %
Lymphs Abs: 0.4 10*3/uL — ABNORMAL LOW (ref 0.7–4.0)
MCH: 29.5 pg (ref 26.0–34.0)
MCHC: 33.8 g/dL (ref 30.0–36.0)
MCV: 87.2 fL (ref 80.0–100.0)
Monocytes Absolute: 0.1 10*3/uL (ref 0.1–1.0)
Monocytes Relative: 1 %
Neutro Abs: 6.3 10*3/uL (ref 1.7–7.7)
Neutrophils Relative %: 93 %
Platelets: 147 10*3/uL — ABNORMAL LOW (ref 150–400)
RBC: 3.36 MIL/uL — ABNORMAL LOW (ref 3.87–5.11)
RDW: 13 % (ref 11.5–15.5)
WBC: 6.8 10*3/uL (ref 4.0–10.5)
nRBC: 0 % (ref 0.0–0.2)

## 2019-10-19 LAB — MRSA PCR SCREENING: MRSA by PCR: NEGATIVE

## 2019-10-19 LAB — TROPONIN I (HIGH SENSITIVITY): Troponin I (High Sensitivity): 45 ng/L — ABNORMAL HIGH (ref <18)

## 2019-10-19 LAB — PROCALCITONIN: Procalcitonin: <0.1 ng/mL

## 2019-10-19 LAB — GLUCOSE, CAPILLARY: Glucose-Capillary: 157 mg/dL — ABNORMAL HIGH (ref 70–99)

## 2019-10-19 MED ORDER — PREDNISONE 20 MG PO TABS
40.0000 mg | ORAL_TABLET | Freq: Every day | ORAL | Status: DC
  Administered 2019-10-20: 40 mg via ORAL
  Filled 2019-10-19: qty 2

## 2019-10-19 MED ORDER — METOPROLOL SUCCINATE ER 25 MG PO TB24
12.5000 mg | ORAL_TABLET | Freq: Every day | ORAL | Status: DC
  Administered 2019-10-19 – 2019-10-25 (×7): 12.5 mg via ORAL
  Filled 2019-10-19 (×5): qty 1
  Filled 2019-10-19: qty 0.5
  Filled 2019-10-19 (×3): qty 1

## 2019-10-19 MED ORDER — ENOXAPARIN SODIUM 40 MG/0.4ML ~~LOC~~ SOLN
40.0000 mg | Freq: Two times a day (BID) | SUBCUTANEOUS | Status: DC
  Administered 2019-10-19 – 2019-10-26 (×14): 40 mg via SUBCUTANEOUS
  Filled 2019-10-19 (×14): qty 0.4

## 2019-10-19 MED ORDER — CHLORHEXIDINE GLUCONATE CLOTH 2 % EX PADS
6.0000 | MEDICATED_PAD | Freq: Every day | CUTANEOUS | Status: DC
  Filled 2019-10-19: qty 6

## 2019-10-19 NOTE — Progress Notes (Signed)
PHARMACIST - PHYSICIAN COMMUNICATION  CONCERNING:  Enoxaparin (Lovenox) for DVT Prophylaxis    RECOMMENDATION: Patient was prescribed enoxaprin 40mg  q24 hours for VTE prophylaxis.   Filed Weights   10/18/19 1955  Weight: 110 kg (242 lb 8.1 oz)    Body mass index is 45.82 kg/m.  Estimated Creatinine Clearance: 88 mL/min (by C-G formula based on SCr of 0.68 mg/dL).   Based on [place] patient is candidate for enoxaparin 40mg  every 12 hour dosing due to BMI being >40.  DESCRIPTION: Pharmacy has adjusted enoxaparin dose per [facility] policy.  Patient is now receiving enoxaparin 40mg  every 12 hours.   Lu Duffel, PharmD Clinical Pharmacist  10/19/2019 1:59 PM

## 2019-10-19 NOTE — ED Notes (Signed)
Peri care provided, pt moved to hospital bed, linen change, purewick placed.

## 2019-10-19 NOTE — ED Notes (Signed)
Waiting on meds to be verified by pharmacy

## 2019-10-19 NOTE — TOC Initial Note (Signed)
Transition of Care [facility]) - Initial/Assessment Note    Patient Details  Name: Natalie Natalie MRN: [id number] Date of Birth: 1960/09/06  [facility] of [facility]) CM/SW Contact:    Magnus Ivan, LCSW Phone Number: 10/19/2019, 11:24 AM  Clinical Narrative:                Patient has high risk for readmission. CSW spoke with patient's Legal Guardian, [name]. [name] confirmed patient is a long term resident at [name] and the plan is for her to return there at discharge. [name] reported patient has a pattern of coming into the hospital and saying she does not want to return to [facility]. [name] reported as Guardian, she makes the decisions for patient and that patient has to go back to [facility] because other SNFs will not accept her. [name] reported she would be happy to talk with patient about this if needed prior to discharge.   CSW spoke with [name] who reported they can accept patient back when she is medically ready for discharge.   Expected Discharge Plan: Skilled Nursing Facility Barriers to Discharge: Continued Medical Work up   Patient Goals and CMS Choice Patient states their goals for this hospitalization and ongoing recovery are:: to go back to [name]- per Legal Guardian   Choice offered to / list presented to : NA  Expected Discharge Plan and Services Expected Discharge Plan: [place] arrangements for the past 2 months: [facility]                                      Prior Living Arrangements/Services Living arrangements for the past 2 months: [place] Lives with:: Facility Resident Patient language and need for interpreter reviewed:: Yes Do you feel safe going back to the place where you live?: Yes      Need for Family Participation in Patient Care: Yes (Comment) Care giver support system in place?: Yes (comment) Current  home services: DME Criminal Activity/Legal Involvement Pertinent to Current Situation/Hospitalization: No - Comment as needed  Activities of Daily Living      Permission Sought/Granted Permission sought to share information with : Customer service manager, Guardian Permission granted to share information with : Yes, Verbal Permission Granted              Emotional Assessment         Alcohol / Substance Use: Not Applicable Psych Involvement: No (comment)  Admission diagnosis:  Acute respiratory failure (HCC) [J96.00] Hyponatremia [E87.1] Acute on chronic combined systolic and diastolic congestive heart failure (HCC) [I50.43] Acute respiratory failure with hypoxia and hypercapnia ([place]) [J96.01, J96.02] Patient Active Problem List   Diagnosis Date Noted  . Acute respiratory failure ([name]) 10/18/2019  . DNR (do not resuscitate) discussion   . Goals of care, counseling/discussion   . Palliative care by specialist   . Acute on chronic respiratory failure ([name]) 10/01/2019  . Acute on chronic diastolic CHF (congestive heart failure) ([name])   . Shortness of breath 09/22/2019  . Acute on chronic respiratory failure with hypercapnia ([name]) 09/13/2019  . Leukocytosis 09/13/2019  . Class 2 obesity with body mass index (BMI) of 39.0 to 39.9 in adult   . Acute on chronic respiratory failure with hypoxemia ([name]) 09/04/2019  . COVID-19 virus infection   . 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  Nausea   . Acute gastritis   . Abdominal pain, chronic, epigastric   . Hyponatremia 07/09/2019  . Ventral hernia 07/09/2019  . Hypertensive crisis 05/01/2019  . Acute abdominal pain 03/15/2019  . Pleural effusion 03/13/2019  . Atelectasis 03/13/2019  . Acute on chronic combined systolic and diastolic CHF (congestive heart failure) ([name]) 03/13/2019  . Type 2 diabetes mellitus without complication ([name]) [id number]  . Urinary tract bacterial infections 03/09/2019  . Acute cystitis without  hematuria   . Constipation   . Acute hypoxemic respiratory failure ([name]) 12/03/2018  . Irritability 11/09/2018  . Aggressive behavior 11/09/2018  . Agitation   . S/P right hip fracture 11/07/2017  . Closed comminuted intertrochanteric fracture of right femur ([name]) 11/07/2017  . Anxiety and depression   . Dyspnea 10/16/2017  . Anemia 10/16/2017  . COPD exacerbation ([name]) 10/07/2017  . Hyperlipidemia 10/07/2017  . Acute on chronic respiratory failure with hypoxia and hypercapnia ([name]) 10/07/2017  . Incontinence of urine 08/14/2011  . Schizoaffective disorder, bipolar type ([name]) 07/05/2011  . Cough 05/08/2011  . Lumbar disc disease 04/09/2011  . Polycythemia secondary to smoking 04/09/2011  . HIP PAIN, LEFT 12/12/2007  . 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 DM (diabetes mellitus), type 2 ([name]) 04/08/2007  . MENOPAUSE, PREMATURE 04/08/2007  . Chronic hyponatremia 04/08/2007  . Schizoaffective disorder ([name]) 04/08/2007  . Essential hypertension 04/08/2007  . COPD (chronic obstructive pulmonary disease) ([place]) 04/08/2007  . GERD 04/08/2007  . Osteoporosis 04/08/2007   PCP:  System, Pcp Not In Pharmacy:   [facility] [place] [address] Phone: [phone] Fax: [phone]     Social Determinants of Health (SDOH) Interventions    Readmission Risk Interventions Readmission Risk Prevention Plan 10/19/2019 09/15/2019 09/08/2019  Transportation Screening Complete Complete Complete  Medication Review Press photographer) Complete Complete Complete  PCP or Specialist appointment within 3-5 days of discharge Complete Complete Complete  HRI or Home Care Consult Complete Complete Complete  HRI or Home Care Consult Pt Refusal Comments - - -  SW Recovery Care/Counseling Consult - - Complete  Palliative Care Screening - - Not Applicable  Skilled Nursing Facility Complete Complete Complete  Some recent data might be hidden

## 2019-10-19 NOTE — Consult Note (Signed)
Natalie Thomas is a 59 y.o. female  [id number]  Primary Cardiologist: Neoma Laming Reason for Consultation: SOB  HPI: Patient is a 59 year old Caucasian female with past medical history of combined HFpEF and HFrEF, dyslipidemia, schizophrenia, and chronic hyponatremia.  Patient presented to the emergency room with respiratory distress that the patient states was worsening over 3 days.  Initial blood pressure was 200/106 with tachycardia of 120.  Patient required BiPAP due to her respiratory distress.  Troponin was on significant.  Chest x-ray showed cardiomegaly with findings of CHF and probable small bilateral pleural effusions.  Patient was given 80 mg of IV Lasix and is currently stable on stepdown unit.  Most recent echocardiogram showed an ejection fraction of 40 to 45% with left ventricular global hypokinesis and grade 3 diastolic dysfunction, without any significant structural abnormalities.   Review of Systems: Patient denies chest pain.  Patient continues to attest to shortness of breath but states it is improved from yesterday.   Past Medical History:  Diagnosis Date  . Anemia 10/16/2017  . Anxiety and depression   . CHF (congestive heart failure) ([place])   . Chronic hyponatremia 04/08/2007   Qualifier: Diagnosis of  By: Marca Ancona RMA, Lucy    . Closed comminuted intertrochanteric fracture of right femur ([place]) 11/07/2017  . COPD 04/08/2007   Qualifier: Diagnosis of  By: Marca Ancona RMA, Lucy    . Depression   . Diabetes mellitus, type 2 ([place])    pt denies but states that she has been treated for DM  . Essential hypertension 04/08/2007   Qualifier: Diagnosis of  By: Marca Ancona RMA, Lucy    . GERD (gastroesophageal reflux disease)   . Hyperlipidemia 10/07/2017  . Lumbar disc disease 04/09/2011  . MENOPAUSE, PREMATURE 04/08/2007   Qualifier: Diagnosis of  By: Marca Ancona RMA, Lucy    . 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 Osteoporosis  04/08/2007   Qualifier: Diagnosis of  By: Reatha Armour, Lucy    . Polycythemia secondary to smoking 04/09/2011  . 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 Schizoaffective disorder, bipolar type ([place]) 07/05/2011    Medications Prior to Admission  Medication Sig Dispense Refill  . acetaminophen (TYLENOL) 500 MG tablet Take 1,000 mg by mouth every 8 (eight) hours as needed for mild pain or moderate pain. Not to exceed 3g/24h of tylenol from all sources.    Marland Kitchen albuterol (PROVENTIL) (2.5 MG/3ML) 0.083% nebulizer solution Take 3 mLs (2.5 mg total) by nebulization every 4 (four) hours as needed for wheezing or shortness of breath. 75 mL 12  . albuterol (VENTOLIN HFA) 108 (90 Base) MCG/ACT inhaler Inhale 2 puffs into the lungs every 6 (six) hours as needed for wheezing or shortness of breath.    . Alum & Mag Hydroxide-Simeth (ANTACID LIQUID PO) Take 30 mLs by mouth every 6 (six) hours as needed (heartburn).    Marland Kitchen ascorbic acid (VITAMIN C) 1000 MG tablet Take 1 tablet (1,000 mg total) by mouth daily.    Marland Kitchen buPROPion (WELLBUTRIN XL) 150 MG 24 hr tablet Take 150 mg by mouth daily.    . Calcium Carb-Cholecalciferol 600-400 MG-UNIT TABS Take 1 tablet by mouth daily.    . clonazePAM (KLONOPIN) 1 MG tablet Take 1 tablet (1 mg total) by mouth 2 (two) times daily. 15 tablet 0  . docusate sodium (COLACE) 100 MG capsule Take 100 mg by mouth 2 (two) times daily.     . fludrocortisone (  FLORINEF) 0.1 MG tablet Take 1 tablet (0.1 mg total) by mouth daily. 60 tablet 1  . fluticasone furoate-vilanterol (BREO ELLIPTA) 100-25 MCG/INH AEPB Inhale 1 puff into the lungs daily.    . furosemide (LASIX) 40 MG tablet Take 1 tablet (40 mg total) by mouth daily.    . hydrOXYzine (ATARAX/VISTARIL) 25 MG tablet Take 25 mg by mouth every 6 (six) hours as needed for anxiety or itching.    . loperamide (IMODIUM) 2 MG capsule Take 2 mg by mouth every 6 (six) hours as needed for diarrhea or loose stools.     Marland Kitchen loratadine (CLARITIN) 10 MG tablet  Take 10 mg by mouth daily.    Marland Kitchen lurasidone (LATUDA) 20 MG TABS tablet Take 1 tablet (20 mg total) by mouth 2 (two) times daily.    . Melatonin 5 MG TABS Take 5 mg by mouth at bedtime.    . Multiple Vitamin (MULTIVITAMIN WITH MINERALS) TABS tablet Take 1 tablet by mouth daily.    . nicotine (NICODERM CQ - DOSED IN MG/24 HOURS) 21 mg/24hr patch Place 21 mg onto the skin daily.     Marland Kitchen NIFEdipine (PROCARDIA XL/ADALAT-CC) 90 MG 24 hr tablet Take 90 mg by mouth daily.    Marland Kitchen nystatin (NYSTATIN) powder Apply 1 application topically 2 (two) times daily as needed (irritation). Apply topically to reddened area twice daily as needed for irritation    . OLANZapine (ZYPREXA) 10 MG tablet Take 10 mg by mouth 2 (two) times daily.    . ondansetron (ZOFRAN) 8 MG tablet Take 8 mg by mouth every 6 (six) hours as needed for nausea or vomiting.    . pantoprazole (PROTONIX) 40 MG tablet Take 1 tablet (40 mg total) by mouth daily. 90 tablet 1  . polyethylene glycol (MIRALAX) 17 g packet Take 17 g by mouth daily. (Patient taking differently: Take 17 g by mouth daily. Mix 1 capful (17 g) in 4-8 oz of water or juice and give by mouth once daily for bowel regimen) 14 each 0  . sodium chloride (OCEAN) 0.65 % SOLN nasal spray Place 2 sprays into both nostrils every 2 (two) hours as needed (dryness, irritation).    Marland Kitchen spironolactone (ALDACTONE) 25 MG tablet Take 1 tablet (25 mg total) by mouth daily.    . sucralfate (CARAFATE) 1 GM/10ML suspension Take 10 mLs (1 g total) by mouth 4 (four) times daily -  with meals and at bedtime. 420 mL 1  . valproic acid (DEPAKENE) 250 MG capsule Take 2 capsules (500 mg total) by mouth 3 (three) times daily. 30 capsule 0  . feeding supplement, ENSURE ENLIVE, (ENSURE ENLIVE) LIQD Take 237 mLs by mouth daily. 237 mL 12  . neomycin-bacitracin-polymyxin (NEOSPORIN) ointment Apply 1 application topically as needed for wound care.       Marland Kitchen ascorbic acid  1,000 mg Oral Daily  . aspirin EC  81 mg Oral  Daily  . buPROPion  150 mg Oral Daily  . calcium-vitamin D  1 tablet Oral Daily  . Chlorhexidine Gluconate Cloth  6 each Topical Daily  . clonazePAM  1 mg Oral BID  . docusate sodium  100 mg Oral BID  . enoxaparin (LOVENOX) injection  40 mg Subcutaneous Q24H  . feeding supplement (ENSURE ENLIVE)  237 mL Oral Q24H  . fludrocortisone  0.1 mg Oral Daily  . furosemide  60 mg Intravenous Q12H  . guaiFENesin  600 mg Oral BID  . ipratropium-albuterol  3 mL Nebulization QID  .  lisinopril  10 mg Oral Daily  . loratadine  10 mg Oral Daily  . lurasidone  20 mg Oral BID  . melatonin  5 mg Oral QHS  . methylPREDNISolone (SOLU-MEDROL) injection  40 mg Intravenous Q8H  . multivitamin with minerals  1 tablet Oral Daily  . nicotine  21 mg Transdermal Daily  . NIFEdipine  90 mg Oral Daily  . OLANZapine  10 mg Oral BID  . pantoprazole  40 mg Oral Daily  . polyethylene glycol  17 g Oral Daily  . sodium chloride flush  3 mL Intravenous Q12H  . spironolactone  25 mg Oral Daily  . sucralfate  1 g Oral TID WC & HS  . valproic acid  500 mg Oral TID    Infusions: . sodium chloride    . cefTRIAXone (ROCEPHIN)  IV Stopped (10/19/19 0235)    Allergies  Allergen Reactions  . Clarithromycin Itching  . Penicillins Itching    Has tolerated cefazolin before  Has patient had a PCN reaction causing immediate rash, facial/tongue/throat swelling, SOB or lightheadedness with hypotension: No Has patient had a PCN reaction causing severe rash involving mucus membranes or skin necrosis: No Has patient had a PCN reaction that required hospitalization: Unknown Has patient had a PCN reaction occurring within the last 10 years: No If all of the above answers are "NO", then may proceed with Cephalosporin use.     Social History   Socioeconomic History  . Marital status: Divorced    Spouse name: Not on file  . Number of children: Not on file  . Years of education: Not on file  . Highest education level: Not  on file  Occupational History  . Occupation: Disabled  Tobacco Use  . Smoking status: Former Smoker    Packs/day: 0.25    Types: Cigarettes    Quit date: 07/23/2019    Years since quitting: 0.2  . Smokeless tobacco: Never Used  Substance and Sexual Activity  . Alcohol use: No  . Drug use: No  . Sexual activity: Never  Other Topics Concern  . Not on file  Social History Narrative   single   Social Determinants of Health   Financial Resource Strain:   . Difficulty of Paying Living Expenses:   Food Insecurity:   . Worried About Charity fundraiser in the Last Year:   . Arboriculturist in the Last Year:   Transportation Needs:   . Film/video editor (Medical):   Marland Kitchen Lack of Transportation (Non-Medical):   Physical Activity:   . Days of Exercise per Week:   . Minutes of Exercise per Session:   Stress:   . Feeling of Stress :   Social Connections:   . Frequency of Communication with Friends and Family:   . Frequency of Social Gatherings with Friends and Family:   . Attends Religious Services:   . Active Member of Clubs or Organizations:   . Attends Archivist Meetings:   Marland Kitchen Marital Status:   Intimate Partner Violence:   . Fear of Current or Ex-Partner:   . Emotionally Abused:   Marland Kitchen Physically Abused:   . Sexually Abused:     History reviewed. No pertinent family history.  PHYSICAL EXAM: Vitals:   10/19/19 0758 10/19/19 0859  BP:  119/81  Pulse:    Resp:    Temp:    SpO2: 93%      Intake/Output Summary (Last 24 hours) at 10/19/2019 [id number] Last data filed  at 10/19/2019 0906 Gross per 24 hour  Intake 106 ml  Output --  Net 106 ml    General:  Well appearing. No respiratory difficulty HEENT: normal Neck: supple. no JVD. Carotids 2+ bilat; no bruits. No lymphadenopathy or thryomegaly appreciated. Cor: PMI nondisplaced. Regular rate & rhythm. No rubs, gallops or murmurs. Lungs: Scattered expiratory wheezes Abdomen: soft, nontender, nondistended. No  hepatosplenomegaly. No bruits or masses. Good bowel sounds. Extremities: no cyanosis, clubbing, rash, +2 bilateral pedal edema Neuro: alert & oriented x 3, cranial nerves grossly intact. moves all 4 extremities w/o difficulty. Affect pleasant.  ECG: Sinus tachycardia with PAC.  111/bpm  Results for orders placed or performed during the hospital encounter of 10/18/19 (from the past 24 hour(s))  Basic metabolic panel     Status: Abnormal   Collection Time: 10/18/19  8:02 PM  Result Value Ref Range   Sodium 124 (L) 135 - 145 mmol/L   Potassium 4.0 3.5 - 5.1 mmol/L   Chloride 83 (L) 98 - 111 mmol/L   CO2 30 22 - 32 mmol/L   Glucose, Bld 128 (H) 70 - 99 mg/dL   BUN 13 6 - 20 mg/dL   Creatinine, Ser 0.78 0.44 - 1.00 mg/dL   Calcium 8.5 (L) 8.9 - 10.3 mg/dL   GFR calc non Af Amer >60 >60 mL/min   GFR calc Af Amer >60 >60 mL/min   Anion gap 11 5 - 15  CBC with Differential     Status: Abnormal   Collection Time: 10/18/19  8:02 PM  Result Value Ref Range   WBC 11.0 (H) 4.0 - 10.5 K/uL   RBC 3.67 (L) 3.87 - 5.11 MIL/uL   Hemoglobin 10.6 (L) 12.0 - 15.0 g/dL   HCT 33.3 (L) 36.0 - 46.0 %   MCV 90.7 80.0 - 100.0 fL   MCH 28.9 26.0 - 34.0 pg   MCHC 31.8 30.0 - 36.0 g/dL   RDW 13.1 11.5 - 15.5 %   Platelets 162 150 - 400 K/uL   nRBC 0.0 0.0 - 0.2 %   Neutrophils Relative % 79 %   Neutro Abs 8.7 (H) 1.7 - 7.7 K/uL   Lymphocytes Relative 10 %   Lymphs Abs 1.1 0.7 - 4.0 K/uL   Monocytes Relative 10 %   Monocytes Absolute 1.1 (H) 0.1 - 1.0 K/uL   Eosinophils Relative 0 %   Eosinophils Absolute 0.0 0.0 - 0.5 K/uL   Basophils Relative 0 %   Basophils Absolute 0.0 0.0 - 0.1 K/uL   Immature Granulocytes 1 %   Abs Immature Granulocytes 0.12 (H) 0.00 - 0.07 K/uL  Troponin I (High Sensitivity)     Status: Abnormal   Collection Time: 10/18/19  8:02 PM  Result Value Ref Range   Troponin I (High Sensitivity) 35 (H) <18 ng/L  Brain natriuretic peptide     Status: Abnormal   Collection Time:  10/18/19  8:02 PM  Result Value Ref Range   B Natriuretic Peptide 580.0 (H) 0.0 - 100.0 pg/mL  Blood gas, venous     Status: Abnormal   Collection Time: 10/18/19  8:07 PM  Result Value Ref Range   FIO2 0.50    Delivery systems BILEVEL POSITIVE AIRWAY PRESSURE    Peep/cpap 5.0 cm H20   Pressure support 16 cm H20   pH, Ven 7.33 7.250 - 7.430   pCO2, Ven 77 (HH) 44.0 - 60.0 mmHg   pO2, Ven 40.0 32.0 - 45.0 mmHg   Bicarbonate 40.6 (H)  20.0 - 28.0 mmol/L   Acid-Base Excess 11.3 (H) 0.0 - 2.0 mmol/L   O2 Saturation 70.7 %   Patient temperature 37.0    Collection site LINE    Sample type VENOUS   Respiratory Panel by RT PCR (Flu A&B, Covid) - Nasopharyngeal Swab     Status: None   Collection Time: 10/18/19  8:56 PM   Specimen: Nasopharyngeal Swab  Result Value Ref Range   SARS Coronavirus 2 by RT PCR NEGATIVE NEGATIVE   Influenza A by PCR NEGATIVE NEGATIVE   Influenza B by PCR NEGATIVE NEGATIVE  Troponin I (High Sensitivity)     Status: Abnormal   Collection Time: 10/18/19 11:42 PM  Result Value Ref Range   Troponin I (High Sensitivity) 45 (H) <18 ng/L  CULTURE, BLOOD (ROUTINE X 2) w Reflex to ID Panel     Status: None (Preliminary result)   Collection Time: 10/18/19 11:42 PM   Specimen: BLOOD  Result Value Ref Range   Specimen Description BLOOD BLOOD LEFT FOREARM    Special Requests      BOTTLES DRAWN AEROBIC AND ANAEROBIC Blood Culture results may not be optimal due to an inadequate volume of blood received in culture bottles   Culture      NO GROWTH < 12 HOURS Performed at [facility], [address] [place], [place] [address]    Report Status PENDING   CULTURE, BLOOD (ROUTINE X 2) w Reflex to ID Panel     Status: None (Preliminary result)   Collection Time: 10/19/19  4:24 AM   Specimen: BLOOD  Result Value Ref Range   Specimen Description BLOOD BLOOD RIGHT WRIST    Special Requests      BOTTLES DRAWN AEROBIC AND ANAEROBIC Blood Culture results may not be  optimal due to an excessive volume of blood received in culture bottles   Culture      NO GROWTH <12 HOURS Performed at [facility], [address] [place], [place] [address]    Report Status PENDING   Basic metabolic panel     Status: Abnormal   Collection Time: 10/19/19  4:25 AM  Result Value Ref Range   Sodium 125 (L) 135 - 145 mmol/L   Potassium 4.8 3.5 - 5.1 mmol/L   Chloride 81 (L) 98 - 111 mmol/L   CO2 33 (H) 22 - 32 mmol/L   Glucose, Bld 166 (H) 70 - 99 mg/dL   BUN 15 6 - 20 mg/dL   Creatinine, Ser 0.68 0.44 - 1.00 mg/dL   Calcium 8.5 (L) 8.9 - 10.3 mg/dL   GFR calc non Af Amer >60 >60 mL/min   GFR calc Af Amer >60 >60 mL/min   Anion gap 11 5 - 15  CBC WITH DIFFERENTIAL     Status: Abnormal   Collection Time: 10/19/19  4:25 AM  Result Value Ref Range   WBC 6.8 4.0 - 10.5 K/uL   RBC 3.36 (L) 3.87 - 5.11 MIL/uL   Hemoglobin 9.9 (L) 12.0 - 15.0 g/dL   HCT 29.3 (L) 36.0 - 46.0 %   MCV 87.2 80.0 - 100.0 fL   MCH 29.5 26.0 - 34.0 pg   MCHC 33.8 30.0 - 36.0 g/dL   RDW 13.0 11.5 - 15.5 %   Platelets 147 (L) 150 - 400 K/uL   nRBC 0.0 0.0 - 0.2 %   Neutrophils Relative % 93 %   Neutro Abs 6.3 1.7 - 7.7 K/uL   Lymphocytes Relative 5 %  Lymphs Abs 0.4 (L) 0.7 - 4.0 K/uL   Monocytes Relative 1 %   Monocytes Absolute 0.1 0.1 - 1.0 K/uL   Eosinophils Relative 0 %   Eosinophils Absolute 0.0 0.0 - 0.5 K/uL   Basophils Relative 0 %   Basophils Absolute 0.0 0.0 - 0.1 K/uL   Immature Granulocytes 1 %   Abs Immature Granulocytes 0.05 0.00 - 0.07 K/uL  Procalcitonin - Baseline     Status: None   Collection Time: 10/19/19  4:25 AM  Result Value Ref Range   Procalcitonin <0.10 ng/mL   DG Chest Portable 1 View  Result Date: 10/18/2019 CLINICAL DATA:  59 year old female with shortness of breath. EXAM: PORTABLE CHEST 1 VIEW COMPARISON:  Chest radiograph dated 10/01/2019. FINDINGS: Cardiomegaly with vascular congestion and edema. Probable small bilateral pleural effusions  and bibasilar atelectasis or infiltrate. No pneumothorax. Atherosclerotic calcification of the aorta. No acute osseous pathology. IMPRESSION: Cardiomegaly with findings of CHF and probable small bilateral pleural effusions. Pneumonia is not excluded. Electronically Signed   By: Anner Crete M.D.   On: 10/18/2019 20:14     ASSESSMENT AND PLAN: Patient presenting to the emergency department in respiratory distress requiring BiPAP.  Patient has since been transitioned back to her home oxygen level of 4 L nasal cannula.  Would recommend continuing her 60 mg IV Lasix q12H. With patient's combined HFpEF and HFrEF, will start her on low-dose beta-blocker. Pleaze continue to monitor the patient as she has a history of being bradycardic especially during her last hospitalization.  Please continue her lisinopril and spironolactone.  Will not plan on repeating echocardiogram as was completed 1 month ago and patient is currently improving.  If patient's respiratory status was to worsen again we will repeat at that time.  We will continue to follow patient  Adaline Sill NP-C

## 2019-10-19 NOTE — Progress Notes (Signed)
[facility]) Hospital Liaison RN note  This is a current outpatient palliative care patient followed by [facility]. When patient is medically stable for discharge, plan will be for patient to be admitted to [facility] services.  Pt is currently under DSS custody.   DNR paperwork received and placed on shadow chart. Dr. Reesa Chew notified to change orders and code status.  Please call with any questions.  Thank you. Margaretmary Eddy, BSN, RN [facility] Liaison [phone]

## 2019-10-19 NOTE — Progress Notes (Signed)
PROGRESS NOTE    Natalie Thomas  [id number] DOB: April 25, 1961 DOA: 10/18/2019 PCP: System, Pcp Not In   Brief Narrative:  Natalie Thomas  is a 59 y.o. 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   She was in respiratory distress initially requiring BiPAP, weaned off by this morning and back to her baseline of 4 L.  Being treated for acute on chronic heart failure.  Subjective: Patient was feeling little better when seen today.  No new complaints.  She does not want to go back to [facility] stating that each time she goes that she gets sick.  Assessment & Plan:   Active Problems:   Acute respiratory failure (HCC)  Acute on chronic respiratory failure secondary to acute on chronic combined heart failure.  Initially requiring BiPAP, now back to her baseline oxygen of 4L. Most recent 2D echo was on 09/14/2019 revealing an EF of 40 to 45% with grade 3 diastolic dysfunction, circumferential pericardial effusion and large pleural effusion with trace mitral regurgitation. -Cardiology is following-greatly appreciate their recommendations. -Continue IV Lasix 60 mg twice daily. -Continue to monitor strict intake and output with daily weight. -Daily BMP -Cardiology added low-dose beta-blocker today, patient did develop asymptomatic bradycardia when she was on beta-blocker prior to this admission. -Transfer her to [place] with cardiac monitor. -Continue lisinopril. -Patient is on fludrocortisone for a long time-could not find any valid reason why it was started, it can contribute to fluid retention and should be discontinued. -Discontinue fludrocortisone-she can be started on midodrine if  needed. - Multiple recent admissions with similar complaints. -Palliative care consult.  Hypertension.  Blood pressure within goal. -Continue her home antihypertensives.  She is on Lasix, spironolactone, nifedipine and lisinopril.  COPD exacerbation.  Due to concern of COPD exacerbation as patient was having wheeze on admission she was started on Solu-Medrol along with azithromycin and ceftriaxone. -Pro Calcitonin less than 0.1-discontinue antibiotics. -Discontinue Solu-Medrol and start her on prednisone 40 mg daily for another 3 days. -Continue DuoNeb.  Hyponatremia.  Patient has an history of chronic hyponatremia most likely secondary to psychogenic polydipsia. -Sodium remained stable around 126 today. -Continue to monitor with fluid restriction.  Hypokalemia.  Resolved -Continue to monitor and replete as needed.  Schizoaffective bipolar disorder. Patient on multiple psych drugs. -Continue Zyprexa, Depakene, Latuda, Wellbutrin and Klonopin  Type 2 diabetes mellitus.  CBG within goal. -Continue with SSI.  Objective: Vitals:   10/19/19 0400 10/19/19 0750 10/19/19 0758 10/19/19 0859  BP: 106/71   119/81  Pulse: 63     Resp: 19     Temp:      TempSrc:      SpO2: 98% 98% 93%   Weight:      Height:        Intake/Output Summary (Last 24 hours) at 10/19/2019 1058 Last data filed at 10/19/2019 0906 Gross per 24 hour  Intake 346 ml  Output --  Net 346 ml   Filed Weights   10/18/19 1955  Weight: 110 kg    Examination:  General exam: Appears calm and comfortable  Respiratory system: Very few scattered wheeze. Respiratory effort normal. Cardiovascular system: S1 & S2 heard, RRR. No JVD,  Gastrointestinal system: Soft, nontender, nondistended, bowel sounds positive. Central nervous system:  Alert and oriented. No focal neurological deficits. Extremities: No edema, no cyanosis, pulses intact and symmetrical. Psychiatry: Judgement and insight appear normal.    DVT  prophylaxis: Lovenox Code Status: Full-according to prior discharge note she was made DNR and paper were signed and given to guardian which needs to be attested. Family Communication: Patient has a guardian. Disposition Plan:  Status is: Inpatient  Remains inpatient appropriate because:IV treatments appropriate due to intensity of illness or inability to take PO and Inpatient level of care appropriate due to severity of illness   Dispo: The patient is from: SNF              Anticipated d/c is to: SNF              Anticipated d/c date is: 1 day              Patient currently is not medically stable to d/c.  Patient with multiple recent admissions with acute on chronic respiratory failure secondary to COPD and CHF exacerbation.  Very high risk for readmission. Her CODE STATUS needs to be addressed. -Palliative care was consulted.  Consultants:   Cardiology  Palliative Care  Procedures:  Antimicrobials:   Data Reviewed: I have personally reviewed following labs and imaging studies  CBC: Recent Labs  Lab 10/18/19 2002 10/19/19 0425  WBC 11.0* 6.8  NEUTROABS 8.7* 6.3  HGB 10.6* 9.9*  HCT 33.3* 29.3*  MCV 90.7 87.2  PLT 162 [id number]*   Basic Metabolic Panel: Recent Labs  Lab 10/18/19 2002 10/19/19 0425  NA 124* 125*  K 4.0 4.8  CL 83* 81*  CO2 30 33*  GLUCOSE 128* 166*  BUN 13 15  CREATININE 0.78 0.68  CALCIUM 8.5* 8.5*   GFR: Estimated Creatinine Clearance: 88 mL/min (by C-G formula based on SCr of 0.68 mg/dL). Liver Function Tests: No results for input(s): AST, ALT, ALKPHOS, BILITOT, PROT, ALBUMIN in the last 168 hours. No results for input(s): LIPASE, AMYLASE in the last 168 hours. No results for input(s): AMMONIA in the last 168 hours. Coagulation Profile: No results for input(s): INR, PROTIME in the last 168 hours. Cardiac Enzymes: No results for input(s): CKTOTAL, CKMB, CKMBINDEX, TROPONINI in the last 168 hours. BNP (last 3 results) No results for  input(s): PROBNP in the last 8760 hours. HbA1C: No results for input(s): HGBA1C in the last 72 hours. CBG: No results for input(s): GLUCAP in the last 168 hours. Lipid Profile: No results for input(s): CHOL, HDL, LDLCALC, TRIG, CHOLHDL, LDLDIRECT in the last 72 hours. Thyroid Function Tests: No results for input(s): TSH, T4TOTAL, FREET4, T3FREE, THYROIDAB in the last 72 hours. Anemia Panel: No results for input(s): VITAMINB12, FOLATE, FERRITIN, TIBC, IRON, RETICCTPCT in the last 72 hours. Sepsis Labs: Recent Labs  Lab 10/19/19 0425  PROCALCITON <0.10    Recent Results (from the past 240 hour(s))  Respiratory Panel by RT PCR (Flu A&B, Covid) - Nasopharyngeal Swab     Status: None   Collection Time: 10/18/19  8:56 PM   Specimen: Nasopharyngeal Swab  Result Value Ref Range Status   SARS Coronavirus 2 by RT PCR NEGATIVE NEGATIVE Final    Comment: (NOTE) SARS-CoV-2 target nucleic acids are NOT DETECTED. The SARS-CoV-2 RNA is generally detectable in upper respiratoy specimens during the acute phase of infection. The lowest concentration of SARS-CoV-2 viral copies this assay can detect is 131 copies/mL. A negative result does not preclude SARS-Cov-2 infection and should not be used as the sole basis for treatment  or other patient management decisions. A negative result may occur with  improper specimen collection/handling, submission of specimen other than nasopharyngeal swab, presence of viral mutation(s) within the areas targeted by this assay, and inadequate number of viral copies (<131 copies/mL). A negative result must be combined with clinical observations, patient history, and epidemiological information. The expected result is Negative. Fact Sheet for Patients:  PinkCheek.be Fact Sheet for Healthcare Providers:  GravelBags.it This test is not yet ap proved or cleared by the [place] FDA and  has been authorized  for detection and/or diagnosis of SARS-CoV-2 by FDA under an Emergency Use Authorization (EUA). This EUA will remain  in effect (meaning this test can be used) for the duration of the COVID-19 declaration under Section 564(b)(1) of the Act, 21 U.S.C. section 360bbb-3(b)(1), unless the authorization is terminated or revoked sooner.    Influenza A by PCR NEGATIVE NEGATIVE Final   Influenza B by PCR NEGATIVE NEGATIVE Final    Comment: (NOTE) The Xpert Xpress SARS-CoV-2/FLU/RSV assay is intended as an aid in  the diagnosis of influenza from Nasopharyngeal swab specimens and  should not be used as a sole basis for treatment. Nasal washings and  aspirates are unacceptable for Xpert Xpress SARS-CoV-2/FLU/RSV  testing. Fact Sheet for Patients: PinkCheek.be Fact Sheet for Healthcare Providers: GravelBags.it This test is not yet approved or cleared by the [place] FDA and  has been authorized for detection and/or diagnosis of SARS-CoV-2 by  FDA under an Emergency Use Authorization (EUA). This EUA will remain  in effect (meaning this test can be used) for the duration of the  Covid-19 declaration under Section 564(b)(1) of the Act, 21  U.S.C. section 360bbb-3(b)(1), unless the authorization is  terminated or revoked. Performed at [facility], [address] [place], [place] [address]   CULTURE, BLOOD (ROUTINE X 2) w Reflex to ID Panel     Status: None (Preliminary result)   Collection Time: 10/18/19 11:42 PM   Specimen: BLOOD  Result Value Ref Range Status   Specimen Description BLOOD BLOOD LEFT FOREARM  Final   Special Requests   Final    BOTTLES DRAWN AEROBIC AND ANAEROBIC Blood Culture results may not be optimal due to an inadequate volume of blood received in culture bottles   Culture   Final    NO GROWTH < 12 HOURS Performed at [facility], [address] [place], [place] [address]    Report Status  PENDING  Incomplete  CULTURE, BLOOD (ROUTINE X 2) w Reflex to ID Panel     Status: None (Preliminary result)   Collection Time: 10/19/19  4:24 AM   Specimen: BLOOD  Result Value Ref Range Status   Specimen Description BLOOD BLOOD RIGHT WRIST  Final   Special Requests   Final    BOTTLES DRAWN AEROBIC AND ANAEROBIC Blood Culture results may not be optimal due to an excessive volume of blood received in culture bottles   Culture   Final    NO GROWTH <12 HOURS Performed at [facility], [address] [place], [place] [address]    Report Status PENDING  Incomplete     Radiology Studies: DG Chest Portable 1 View  Result Date: 10/18/2019 CLINICAL DATA:  59 year old female with shortness of breath. EXAM: PORTABLE CHEST 1 VIEW COMPARISON:  Chest radiograph dated 10/01/2019. FINDINGS: Cardiomegaly with vascular congestion and edema. Probable small bilateral pleural effusions and bibasilar atelectasis or infiltrate. No pneumothorax. Atherosclerotic calcification of the aorta. No acute osseous  pathology. IMPRESSION: Cardiomegaly with findings of CHF and probable small bilateral pleural effusions. Pneumonia is not excluded. Electronically Signed   By: Anner Crete M.D.   On: 10/18/2019 20:14    Scheduled Meds: . ascorbic acid  1,000 mg Oral Daily  . aspirin EC  81 mg Oral Daily  . buPROPion  150 mg Oral Daily  . calcium-vitamin D  1 tablet Oral Daily  . Chlorhexidine Gluconate Cloth  6 each Topical Daily  . clonazePAM  1 mg Oral BID  . docusate sodium  100 mg Oral BID  . enoxaparin (LOVENOX) injection  40 mg Subcutaneous Q24H  . feeding supplement (ENSURE ENLIVE)  237 mL Oral Q24H  . fludrocortisone  0.1 mg Oral Daily  . furosemide  60 mg Intravenous Q12H  . guaiFENesin  600 mg Oral BID  . ipratropium-albuterol  3 mL Nebulization QID  . lisinopril  10 mg Oral Daily  . loratadine  10 mg Oral Daily  . lurasidone  20 mg Oral BID  . melatonin  5 mg Oral QHS  . methylPREDNISolone  (SOLU-MEDROL) injection  40 mg Intravenous Q8H  . metoprolol succinate  12.5 mg Oral Daily  . multivitamin with minerals  1 tablet Oral Daily  . nicotine  21 mg Transdermal Daily  . NIFEdipine  90 mg Oral Daily  . OLANZapine  10 mg Oral BID  . pantoprazole  40 mg Oral Daily  . polyethylene glycol  17 g Oral Daily  . sodium chloride flush  3 mL Intravenous Q12H  . spironolactone  25 mg Oral Daily  . sucralfate  1 g Oral TID WC & HS  . valproic acid  500 mg Oral TID   Continuous Infusions: . sodium chloride    . cefTRIAXone (ROCEPHIN)  IV Stopped (10/19/19 0235)     LOS: 1 day   Time spent: 45 minutes.  Lorella Nimrod, MD [facility]  If 7PM-7AM, please contact night-coverage [email or web address]  10/19/2019, 10:58 AM   This record has been created using Systems analyst. Errors have been sought and corrected,but may not always be located. Such creation errors do not reflect on the standard of care.

## 2019-10-19 NOTE — Progress Notes (Signed)
PHARMACY - PHYSICIAN COMMUNICATION CRITICAL VALUE ALERT - BLOOD CULTURE IDENTIFICATION (BCID)  Natalie Thomas is an 59 y.o. female who presented to [place] [facility] on 10/18/2019  Assessment: 1/4 bottles staph species, mecA+. Patient is afebrile, WBC normal, PCT < 0.10  Name of physician (or Provider) Contacted: Dr. Reesa Chew  Current antibiotics: none  Changes to prescribed antibiotics recommended: continue to monitor off antibiotics for now, suspect contamination  Results for orders placed or performed during the hospital encounter of 10/18/19  Blood Culture ID Panel (Reflexed) (Collected: 10/18/2019 11:42 PM)  Result Value Ref Range   Enterococcus species NOT DETECTED NOT DETECTED   Listeria monocytogenes NOT DETECTED NOT DETECTED   Staphylococcus species DETECTED (A) NOT DETECTED   Staphylococcus aureus (BCID) NOT DETECTED NOT DETECTED   Methicillin resistance DETECTED (A) NOT DETECTED   Streptococcus species NOT DETECTED NOT DETECTED   Streptococcus agalactiae NOT DETECTED NOT DETECTED   Streptococcus pneumoniae NOT DETECTED NOT DETECTED   Streptococcus pyogenes NOT DETECTED NOT DETECTED   Acinetobacter baumannii NOT DETECTED NOT DETECTED   Enterobacteriaceae species NOT DETECTED NOT DETECTED   Enterobacter cloacae complex NOT DETECTED NOT DETECTED   Escherichia coli NOT DETECTED NOT DETECTED   Klebsiella oxytoca NOT DETECTED NOT DETECTED   Klebsiella pneumoniae NOT DETECTED NOT DETECTED   Proteus species NOT DETECTED NOT DETECTED   Serratia marcescens NOT DETECTED NOT DETECTED   Haemophilus influenzae NOT DETECTED NOT DETECTED   Neisseria meningitidis NOT DETECTED NOT DETECTED   Pseudomonas aeruginosa NOT DETECTED NOT DETECTED   Candida albicans NOT DETECTED NOT DETECTED   Candida glabrata NOT DETECTED NOT DETECTED   Candida krusei NOT DETECTED NOT DETECTED   Candida parapsilosis NOT DETECTED NOT DETECTED   Candida tropicalis NOT DETECTED NOT DETECTED    Tawnya Crook,  PharmD 10/19/2019  5:47 PM

## 2019-10-20 ENCOUNTER — Inpatient Hospital Stay: Payer: Medicare Other

## 2019-10-20 LAB — CBC WITH DIFFERENTIAL/PLATELET
Abs Immature Granulocytes: 0.06 10*3/uL (ref 0.00–0.07)
Basophils Absolute: 0 10*3/uL (ref 0.0–0.1)
Basophils Relative: 0 %
Eosinophils Absolute: 0.1 10*3/uL (ref 0.0–0.5)
Eosinophils Relative: 1 %
HCT: 29.2 % — ABNORMAL LOW (ref 36.0–46.0)
Hemoglobin: 9.6 g/dL — ABNORMAL LOW (ref 12.0–15.0)
Immature Granulocytes: 1 %
Lymphocytes Relative: 22 %
Lymphs Abs: 1.7 10*3/uL (ref 0.7–4.0)
MCH: 29.1 pg (ref 26.0–34.0)
MCHC: 32.9 g/dL (ref 30.0–36.0)
MCV: 88.5 fL (ref 80.0–100.0)
Monocytes Absolute: 0.9 10*3/uL (ref 0.1–1.0)
Monocytes Relative: 12 %
Neutro Abs: 5.1 10*3/uL (ref 1.7–7.7)
Neutrophils Relative %: 64 %
Platelets: 161 10*3/uL (ref 150–400)
RBC: 3.3 MIL/uL — ABNORMAL LOW (ref 3.87–5.11)
RDW: 13.4 % (ref 11.5–15.5)
WBC: 7.8 10*3/uL (ref 4.0–10.5)
nRBC: 0 % (ref 0.0–0.2)

## 2019-10-20 LAB — BASIC METABOLIC PANEL
Anion gap: 11 (ref 5–15)
BUN: 32 mg/dL — ABNORMAL HIGH (ref 6–20)
CO2: 35 mmol/L — ABNORMAL HIGH (ref 22–32)
Calcium: 8.4 mg/dL — ABNORMAL LOW (ref 8.9–10.3)
Chloride: 78 mmol/L — ABNORMAL LOW (ref 98–111)
Creatinine, Ser: 0.91 mg/dL (ref 0.44–1.00)
GFR calc Af Amer: >60 mL/min (ref >60)
GFR calc non Af Amer: >60 mL/min (ref >60)
Glucose, Bld: 125 mg/dL — ABNORMAL HIGH (ref 70–99)
Potassium: 4.3 mmol/L (ref 3.5–5.1)
Sodium: 124 mmol/L — ABNORMAL LOW (ref 135–145)

## 2019-10-20 LAB — PROCALCITONIN: Procalcitonin: <0.1 ng/mL

## 2019-10-20 MED ORDER — METHYLPREDNISOLONE SODIUM SUCC 125 MG IJ SOLR
60.0000 mg | Freq: Every day | INTRAMUSCULAR | Status: DC
  Administered 2019-10-20 – 2019-10-24 (×5): 60 mg via INTRAVENOUS
  Filled 2019-10-20 (×5): qty 2

## 2019-10-20 MED ORDER — NIFEDIPINE ER 60 MG PO TB24
60.0000 mg | ORAL_TABLET | Freq: Every day | ORAL | Status: DC
  Administered 2019-10-21 – 2019-10-26 (×6): 60 mg via ORAL
  Filled 2019-10-20 (×7): qty 1

## 2019-10-20 NOTE — Progress Notes (Signed)
PALLIATIVE NOTE:  Referral received for goals of care discussion. Chart reviewed. Per notations goals appear to be set and clear as mentioned by Natalie Thomas, CSW "Natalie Thomas reported as Guardian, she makes the decisions for patient and that patient has to go back to [place] because other SNFs will not accept her."   Patient is also established with outpatient Palliative Teacher, early years/pre) per notations from [place], RN ([place]) "When patient is medically stable for discharge, plan will be for patient to be admitted to [facility]. Pt is currently under DSS custody.  DNR paperwork received and placed on shadow chart. Dr. Reesa Chew notified to change orders and code status."  Patient's code status has been changed by Dr. Reesa Chew in [facility].   Given goals are clearly stated and set for Natalie Thomas, no needs or interventions from Palliative are needed. If further Palliative needs are identified, please do not hesitate to contact our team, as we will be happy to assist in Natalie Thomas care.   Thank you for your referral.   [name], AGPCNP-BC Palliative Medicine Team  Phone: [phone] Pager: [phone] Amion: [place]

## 2019-10-20 NOTE — Progress Notes (Signed)
To 5lnc after neb tx

## 2019-10-20 NOTE — Progress Notes (Addendum)
PROGRESS NOTE    Natalie Thomas  [id number] DOB: 03-Jun-1961 DOA: 10/18/2019 PCP: System, Pcp Not In   Brief Narrative:  Natalie Thomas  is a 59 y.o. 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   She was in respiratory distress initially requiring BiPAP, weaned off by this morning and back to her baseline of 4 L.  Being treated for acute on chronic heart failure.  Subjective: Patient was not feeling well when seen today, she was complaining of shortness of breath and some nausea.  Denies any chest pain.  Assessment & Plan:   Active Problems:   Acute on chronic combined systolic and diastolic congestive heart failure (HCC)   Acute respiratory failure (HCC)  Acute on chronic respiratory failure secondary to acute on chronic combined heart failure.  Initially requiring BiPAP, now back to her baseline oxygen of 4L. Most recent 2D echo was on 09/14/2019 revealing an EF of 40 to 45% with grade 3 diastolic dysfunction, circumferential pericardial effusion and large pleural effusion with trace mitral regurgitation. -Cardiology is following-greatly appreciate their recommendations. -Continue IV Lasix 60 mg twice daily. -Continue to monitor strict intake and output with daily weight. -Daily BMP -Cardiology added low-dose beta-blocker today, patient did develop asymptomatic bradycardia when she was on beta-blocker prior to this admission. -Continue lisinopril. -Patient is on fludrocortisone for a long time-could not find any valid reason why it was started, it can contribute to fluid retention and should be discontinued. -Discontinue fludrocortisone-she can be started on midodrine if  needed. - Multiple recent admissions with similar complaints. -Palliative care consult.  Hypertension.  Blood pressure within goal, with at times some softer readings. -  She is on Lasix, spironolactone, nifedipine and lisinopril. -Please a dose of nifedipine from 90 to 60 mg daily. -Continue lisinopril and spironolactone.  COPD exacerbation.  Due to concern of COPD exacerbation as patient was having wheeze on admission she was started on Solu-Medrol along with azithromycin and ceftriaxone. She was having a wheeze this morning. -Pro Calcitonin less than 0.1-discontinue antibiotics. -Continue DuoNeb. -Solu-Medrol 60 mg daily.  Positive blood culture.  1 out of 4 bottles were growing coagulase-negative Staphylococcus, methicillin-resistant.  Most likely a contaminant as patient remained afebrile, no leukocytosis and negative procalcitonin.  No need for antibiotics for now. -Continue to monitor  Hyponatremia.  Patient has an history of chronic hyponatremia most likely secondary to psychogenic polydipsia. -Sodium remained stable. -Continue to monitor with fluid restriction.  Hypokalemia.  Resolved -Continue to monitor and replete as needed.  Schizoaffective bipolar disorder. Patient on multiple psych drugs. -Continue Zyprexa, Depakene, Latuda, Wellbutrin and Klonopin  Type 2 diabetes mellitus.  CBG within goal. -Continue with SSI.  Objective: Vitals:   10/20/19 0230 10/20/19 0406 10/20/19 0736 10/20/19 0809  BP:   95/61   Pulse:   60   Resp:      Temp:   97.7 F (36.5 C)   TempSrc:   Oral   SpO2: 96%  100% 100%  Weight:  104.6 kg    Height:        Intake/Output Summary (Last 24 hours) at 10/20/2019 1351 Last data filed at 10/20/2019 1300 Gross per 24 hour  Intake 240 ml  Output 1850 ml  Net -1610 ml  Filed Weights   10/18/19 1955 10/20/19 0406  Weight: 110 kg 104.6 kg    Examination:  General exam: Chronically ill-appearing lady, appears little anxious, in no  acute distress. Respiratory system:  scattered wheeze. Respiratory effort normal. Cardiovascular system: S1 & S2 heard, RRR. No JVD,  Gastrointestinal system: Soft, nontender, nondistended, bowel sounds positive. Central nervous system: Alert and oriented. No focal neurological deficits. Extremities: No edema, no cyanosis, pulses intact and symmetrical. Psychiatry: Judgement and insight appear impaired.   DVT prophylaxis: Lovenox Code Status: DNR-there was some discrepancy between DNR and full code, cleared from [facility] services that patient has legally signed papers for DNR. Family Communication: Patient has a guardian. Disposition Plan:  Status is: Inpatient  Remains inpatient appropriate because:IV treatments appropriate due to intensity of illness or inability to take PO and Inpatient level of care appropriate due to severity of illness   Dispo: The patient is from: SNF              Anticipated d/c is to: SNF              Anticipated d/c date is: 1 day              Patient currently is not medically stable to d/c.  Patient with multiple recent admissions with acute on chronic respiratory failure secondary to COPD and CHF exacerbation.  Very high risk for readmission, deterioration and death. -Palliative care was consulted.  Consultants:   Cardiology  Palliative Care  Procedures:  Antimicrobials:   Data Reviewed: I have personally reviewed following labs and imaging studies  CBC: Recent Labs  Lab 10/18/19 2002 10/19/19 0425 10/20/19 0556  WBC 11.0* 6.8 7.8  NEUTROABS 8.7* 6.3 5.1  HGB 10.6* 9.9* 9.6*  HCT 33.3* 29.3* 29.2*  MCV 90.7 87.2 88.5  PLT 162 147* [id number]   Basic Metabolic Panel: Recent Labs  Lab 10/18/19 2002 10/19/19 0425 10/20/19 0556  NA 124* 125* 124*  K 4.0 4.8 4.3  CL 83* 81* 78*  CO2 30 33* 35*  GLUCOSE 128* 166* 125*  BUN 13 15 32*  CREATININE 0.78 0.68 0.91  CALCIUM 8.5* 8.5* 8.4*   GFR: Estimated Creatinine Clearance: 75  mL/min (by C-G formula based on SCr of 0.91 mg/dL). Liver Function Tests: No results for input(s): AST, ALT, ALKPHOS, BILITOT, PROT, ALBUMIN in the last 168 hours. No results for input(s): LIPASE, AMYLASE in the last 168 hours. No results for input(s): AMMONIA in the last 168 hours. Coagulation Profile: No results for input(s): INR, PROTIME in the last 168 hours. Cardiac Enzymes: No results for input(s): CKTOTAL, CKMB, CKMBINDEX, TROPONINI in the last 168 hours. BNP (last 3 results) No results for input(s): PROBNP in the last 8760 hours. HbA1C: No results for input(s): HGBA1C in the last 72 hours. CBG: Recent Labs  Lab 10/19/19 0322  GLUCAP 157*   Lipid Profile: No results for input(s): CHOL, HDL, LDLCALC, TRIG, CHOLHDL, LDLDIRECT in the last 72 hours. Thyroid Function Tests: No results for input(s): TSH, T4TOTAL, FREET4, T3FREE, THYROIDAB in the last 72 hours. Anemia Panel: No results for input(s): VITAMINB12, FOLATE, FERRITIN, TIBC, IRON, RETICCTPCT in the last 72 hours. Sepsis Labs: Recent Labs  Lab 10/19/19 0425 10/20/19 0556  PROCALCITON <0.10 <0.10    Recent Results (from the past 240 hour(s))  Respiratory Panel by RT PCR (Flu A&B, Covid) - Nasopharyngeal Swab     Status: None   Collection Time: 10/18/19  8:56 PM   Specimen: Nasopharyngeal Swab  Result Value Ref Range Status   SARS Coronavirus 2 by RT PCR NEGATIVE NEGATIVE Final    Comment: (NOTE) SARS-CoV-2 target nucleic acids are NOT DETECTED. The SARS-CoV-2 RNA is generally detectable in upper respiratoy specimens during the acute phase of infection. The lowest concentration of SARS-CoV-2 viral copies this assay can detect is 131 copies/mL. A negative result does not preclude SARS-Cov-2 infection and should not be used as the sole basis for treatment or other patient management decisions. A negative result may occur with  improper specimen collection/handling, submission of specimen other than nasopharyngeal  swab, presence of viral mutation(s) within the areas targeted by this assay, and inadequate number of viral copies (<131 copies/mL). A negative result must be combined with clinical observations, patient history, and epidemiological information. The expected result is Negative. Fact Sheet for Patients:  PinkCheek.be Fact Sheet for Healthcare Providers:  GravelBags.it This test is not yet ap proved or cleared by the [place] FDA and  has been authorized for detection and/or diagnosis of SARS-CoV-2 by FDA under an Emergency Use Authorization (EUA). This EUA will remain  in effect (meaning this test can be used) for the duration of the COVID-19 declaration under Section 564(b)(1) of the Act, 21 U.S.C. section 360bbb-3(b)(1), unless the authorization is terminated or revoked sooner.    Influenza A by PCR NEGATIVE NEGATIVE Final   Influenza B by PCR NEGATIVE NEGATIVE Final    Comment: (NOTE) The Xpert Xpress SARS-CoV-2/FLU/RSV assay is intended as an aid in  the diagnosis of influenza from Nasopharyngeal swab specimens and  should not be used as a sole basis for treatment. Nasal washings and  aspirates are unacceptable for Xpert Xpress SARS-CoV-2/FLU/RSV  testing. Fact Sheet for Patients: PinkCheek.be Fact Sheet for Healthcare Providers: GravelBags.it This test is not yet approved or cleared by the [place] FDA and  has been authorized for detection and/or diagnosis of SARS-CoV-2 by  FDA under an Emergency Use Authorization (EUA). This EUA will remain  in effect (meaning this test can be used) for the duration of the  Covid-19 declaration under Section 564(b)(1) of the Act, 21  U.S.C. section 360bbb-3(b)(1), unless the authorization is  terminated or revoked. Performed at [facility], [address] [place], [place] [address]   CULTURE, BLOOD  (ROUTINE X 2) w Reflex to ID Panel     Status: Abnormal (Preliminary result)   Collection Time: 10/18/19 11:42 PM   Specimen: BLOOD  Result Value Ref Range Status   Specimen Description   Final    BLOOD BLOOD LEFT FOREARM Performed at [facility], [address] [place], [place] [address]    Special Requests   Final    BOTTLES DRAWN AEROBIC AND ANAEROBIC Blood Culture results may not be optimal due to an inadequate volume of blood received in culture bottles Performed at [facility], [address], [place], [place] [address]    Culture  Setup Time   Final    GRAM POSITIVE COCCI IN BOTH AEROBIC AND ANAEROBIC BOTTLES CRITICAL RESULT CALLED TO, READ BACK BY AND VERIFIED WITH: ABBY ELLINGTON AT 1658 4826/21 SDR    Culture (A)  Final    STAPHYLOCOCCUS SPECIES (COAGULASE NEGATIVE) THE SIGNIFICANCE OF ISOLATING THIS ORGANISM FROM A SINGLE SET OF BLOOD CULTURES WHEN MULTIPLE SETS ARE DRAWN IS UNCERTAIN. PLEASE NOTIFY THE MICROBIOLOGY DEPARTMENT WITHIN ONE WEEK IF SPECIATION AND SENSITIVITIES ARE REQUIRED. Performed at [facility], [place] [address] [place], [place] [address]    Report  Status PENDING  Incomplete  Blood Culture ID Panel (Reflexed)     Status: Abnormal   Collection Time: 10/18/19 11:42 PM  Result Value Ref Range Status   Enterococcus species NOT DETECTED NOT DETECTED Final   Listeria monocytogenes NOT DETECTED NOT DETECTED Final   Staphylococcus species DETECTED (A) NOT DETECTED Final    Comment: Methicillin (oxacillin) resistant coagulase negative staphylococcus. Possible blood culture contaminant (unless isolated from more than one blood culture draw or clinical case suggests pathogenicity). No antibiotic treatment is indicated for blood  culture contaminants. CRITICAL RESULT CALLED TO, READ BACK BY AND VERIFIED WITH:  ABBY ELLINGTON AT [id number] 10/19/19 SDR    Staphylococcus aureus (BCID) NOT DETECTED NOT DETECTED Final   Methicillin resistance  DETECTED (A) NOT DETECTED Final    Comment: CRITICAL RESULT CALLED TO, READ BACK BY AND VERIFIED WITH:  ABBY ELLINGTON AT [id number] 10/19/19 SDR    Streptococcus species NOT DETECTED NOT DETECTED Final   Streptococcus agalactiae NOT DETECTED NOT DETECTED Final   Streptococcus pneumoniae NOT DETECTED NOT DETECTED Final   Streptococcus pyogenes NOT DETECTED NOT DETECTED Final   Acinetobacter baumannii NOT DETECTED NOT DETECTED Final   Enterobacteriaceae species NOT DETECTED NOT DETECTED Final   Enterobacter cloacae complex NOT DETECTED NOT DETECTED Final   Escherichia coli NOT DETECTED NOT DETECTED Final   Klebsiella oxytoca NOT DETECTED NOT DETECTED Final   Klebsiella pneumoniae NOT DETECTED NOT DETECTED Final   Proteus species NOT DETECTED NOT DETECTED Final   Serratia marcescens NOT DETECTED NOT DETECTED Final   Haemophilus influenzae NOT DETECTED NOT DETECTED Final   Neisseria meningitidis NOT DETECTED NOT DETECTED Final   Pseudomonas aeruginosa NOT DETECTED NOT DETECTED Final   Candida albicans NOT DETECTED NOT DETECTED Final   Candida glabrata NOT DETECTED NOT DETECTED Final   Candida krusei NOT DETECTED NOT DETECTED Final   Candida parapsilosis NOT DETECTED NOT DETECTED Final   Candida tropicalis NOT DETECTED NOT DETECTED Final    Comment: Performed at [facility], [address] [place], [place] [address]  CULTURE, BLOOD (ROUTINE X 2) w Reflex to ID Panel     Status: None (Preliminary result)   Collection Time: 10/19/19  4:24 AM   Specimen: BLOOD  Result Value Ref Range Status   Specimen Description BLOOD BLOOD RIGHT WRIST  Final   Special Requests   Final    BOTTLES DRAWN AEROBIC AND ANAEROBIC Blood Culture results may not be optimal due to an excessive volume of blood received in culture bottles   Culture   Final    NO GROWTH 1 DAY Performed at [facility], [address] [place], [place] [address]    Report Status PENDING  Incomplete  MRSA PCR Screening      Status: None   Collection Time: 10/19/19  2:00 PM   Specimen: Nasal Mucosa; Nasopharyngeal  Result Value Ref Range Status   MRSA by PCR NEGATIVE NEGATIVE Final    Comment:        The GeneXpert MRSA Assay (FDA approved for NASAL specimens only), is one component of a comprehensive MRSA colonization surveillance program. It is not intended to diagnose MRSA infection nor to guide or monitor treatment for MRSA infections. Performed at [facility], [address] [place], [place] [address]      Radiology Studies: DG Chest Portable 1 View  Result Date: 10/18/2019 CLINICAL DATA:  59 year old female with shortness of breath. EXAM: PORTABLE CHEST 1 VIEW COMPARISON:  Chest radiograph dated 10/01/2019. FINDINGS:  Cardiomegaly with vascular congestion and edema. Probable small bilateral pleural effusions and bibasilar atelectasis or infiltrate. No pneumothorax. Atherosclerotic calcification of the aorta. No acute osseous pathology. IMPRESSION: Cardiomegaly with findings of CHF and probable small bilateral pleural effusions. Pneumonia is not excluded. Electronically Signed   By: Anner Crete M.D.   On: 10/18/2019 20:14    Scheduled Meds: . ascorbic acid  1,000 mg Oral Daily  . aspirin EC  81 mg Oral Daily  . buPROPion  150 mg Oral Daily  . calcium-vitamin D  1 tablet Oral Daily  . Chlorhexidine Gluconate Cloth  6 each Topical Daily  . clonazePAM  1 mg Oral BID  . docusate sodium  100 mg Oral BID  . enoxaparin (LOVENOX) injection  40 mg Subcutaneous Q12H  . feeding supplement (ENSURE ENLIVE)  237 mL Oral Q24H  . furosemide  60 mg Intravenous Q12H  . guaiFENesin  600 mg Oral BID  . ipratropium-albuterol  3 mL Nebulization QID  . lisinopril  10 mg Oral Daily  . loratadine  10 mg Oral Daily  . lurasidone  20 mg Oral BID  . melatonin  5 mg Oral QHS  . methylPREDNISolone (SOLU-MEDROL) injection  60 mg Intravenous Daily  . metoprolol succinate  12.5 mg Oral Daily  .  multivitamin with minerals  1 tablet Oral Daily  . nicotine  21 mg Transdermal Daily  . NIFEdipine  90 mg Oral Daily  . OLANZapine  10 mg Oral BID  . pantoprazole  40 mg Oral Daily  . polyethylene glycol  17 g Oral Daily  . sodium chloride flush  3 mL Intravenous Q12H  . spironolactone  25 mg Oral Daily  . sucralfate  1 g Oral TID WC & HS  . valproic acid  500 mg Oral TID   Continuous Infusions: . sodium chloride       LOS: 2 days   Time spent: 45 minutes.  Lorella Nimrod, MD [facility]  If 7PM-7AM, please contact night-coverage [email or web address]  10/20/2019, 1:51 PM   This record has been created using Systems analyst. Errors have been sought and corrected,but may not always be located. Such creation errors do not reflect on the standard of care.

## 2019-10-20 NOTE — Progress Notes (Signed)
SUBJECTIVE: Patient with complaints of difficulty breathing and nausea this morning.  Patient denies chest pain.   Vitals:   10/20/19 0230 10/20/19 0406 10/20/19 0736 10/20/19 0809  BP:   95/61   Pulse:   60   Resp:      Temp:   97.7 F (36.5 C)   TempSrc:   Oral   SpO2: 96%  100% 100%  Weight:  104.6 kg    Height:        Intake/Output Summary (Last 24 hours) at 10/20/2019 0947 Last data filed at 10/20/2019 0300 Gross per 24 hour  Intake 240 ml  Output 1500 ml  Net -1260 ml    LABS: Basic Metabolic Panel: Recent Labs    10/19/19 0425 10/20/19 0556  NA 125* 124*  K 4.8 4.3  CL 81* 78*  CO2 33* 35*  GLUCOSE 166* 125*  BUN 15 32*  CREATININE 0.68 0.91  CALCIUM 8.5* 8.4*   Liver Function Tests: No results for input(s): AST, ALT, ALKPHOS, BILITOT, PROT, ALBUMIN in the last 72 hours. No results for input(s): LIPASE, AMYLASE in the last 72 hours. CBC: Recent Labs    10/19/19 0425 10/20/19 0556  WBC 6.8 7.8  NEUTROABS 6.3 5.1  HGB 9.9* 9.6*  HCT 29.3* 29.2*  MCV 87.2 88.5  PLT 147* 161   Cardiac Enzymes: No results for input(s): CKTOTAL, CKMB, CKMBINDEX, TROPONINI in the last 72 hours. BNP: Invalid input(s): POCBNP D-Dimer: No results for input(s): DDIMER in the last 72 hours. Hemoglobin A1C: No results for input(s): HGBA1C in the last 72 hours. Fasting Lipid Panel: No results for input(s): CHOL, HDL, LDLCALC, TRIG, CHOLHDL, LDLDIRECT in the last 72 hours. Thyroid Function Tests: No results for input(s): TSH, T4TOTAL, T3FREE, THYROIDAB in the last 72 hours.  Invalid input(s): FREET3 Anemia Panel: No results for input(s): VITAMINB12, FOLATE, FERRITIN, TIBC, IRON, RETICCTPCT in the last 72 hours.   PHYSICAL EXAM General: Well developed, well nourished, in no acute distress HEENT:  Normocephalic and atramatic Neck:  No JVD.  Lungs: Expiratory wheezes throughout. Heart: HRRR . Normal S1 and S2 without gallops or murmurs.  Abdomen: Bowel sounds are  positive, abdomen soft and non-tender  Msk:  Back normal, normal gait. Normal strength and tone for age. Extremities: No clubbing, cyanosis, + 2 pedal edema bilaterally.    Neuro: Alert and oriented X 3. Psych:  Good affect, responds appropriately  TELEMETRY: NSR 80/bpm  ASSESSMENT AND PLAN: Patient presenting to the emergency department in respiratory distress requiring BiPAP.  Patient's oxygen requirement has increased to 6 L [place] from 4 L and therefore will repeat echocardiogram.  Would recommend continuing her 60 mg IV Lasix q12H. Patient's blood pressure mildly soft this morning, with patient's combined HFpEF and HFrEF please continue low-dose beta-blocker and hold other antihypertensives appropriately.  We will continue to follow patient.  Active Problems:   Acute on chronic combined systolic and diastolic congestive heart failure ([place])   Acute respiratory failure ([place])    Adaline Sill, NP-C 10/20/2019 9:47 AM

## 2019-10-21 ENCOUNTER — Ambulatory Visit
Admit: 2019-10-21 | Discharge: 2019-10-21 | Disposition: A | Discharge status: Home / Self Care | Payer: Medicare Other | Source: Ambulatory Visit | Attending: Nurse Practitioner | Admitting: Nurse Practitioner

## 2019-10-21 ENCOUNTER — Ambulatory Visit: Payer: Medicare Other | Admitting: Family

## 2019-10-21 ENCOUNTER — Inpatient Hospital Stay: Payer: Medicare Other

## 2019-10-21 DIAGNOSIS — J449 Chronic obstructive pulmonary disease, unspecified: Secondary | ICD-10-CM | POA: Insufficient documentation

## 2019-10-21 DIAGNOSIS — I340 Nonrheumatic mitral (valve) insufficiency: Secondary | ICD-10-CM | POA: Insufficient documentation

## 2019-10-21 DIAGNOSIS — I110 Hypertensive heart disease with heart failure: Secondary | ICD-10-CM | POA: Diagnosis not present

## 2019-10-21 DIAGNOSIS — I5031 Acute diastolic (congestive) heart failure: Secondary | ICD-10-CM | POA: Insufficient documentation

## 2019-10-21 LAB — BASIC METABOLIC PANEL
Anion gap: 9 (ref 5–15)
BUN: 36 mg/dL — ABNORMAL HIGH (ref 6–20)
CO2: 38 mmol/L — ABNORMAL HIGH (ref 22–32)
Calcium: 8.7 mg/dL — ABNORMAL LOW (ref 8.9–10.3)
Chloride: 76 mmol/L — ABNORMAL LOW (ref 98–111)
Creatinine, Ser: 0.78 mg/dL (ref 0.44–1.00)
GFR calc Af Amer: >60 mL/min (ref >60)
GFR calc non Af Amer: >60 mL/min (ref >60)
Glucose, Bld: 151 mg/dL — ABNORMAL HIGH (ref 70–99)
Potassium: 4.8 mmol/L (ref 3.5–5.1)
Sodium: 123 mmol/L — ABNORMAL LOW (ref 135–145)

## 2019-10-21 LAB — CBC WITH DIFFERENTIAL/PLATELET
Abs Immature Granulocytes: 0.06 10*3/uL (ref 0.00–0.07)
Basophils Absolute: 0 10*3/uL (ref 0.0–0.1)
Basophils Relative: 0 %
Eosinophils Absolute: 0 10*3/uL (ref 0.0–0.5)
Eosinophils Relative: 0 %
HCT: 30.1 % — ABNORMAL LOW (ref 36.0–46.0)
Hemoglobin: 10.2 g/dL — ABNORMAL LOW (ref 12.0–15.0)
Immature Granulocytes: 1 %
Lymphocytes Relative: 13 %
Lymphs Abs: 1.1 10*3/uL (ref 0.7–4.0)
MCH: 29.3 pg (ref 26.0–34.0)
MCHC: 33.9 g/dL (ref 30.0–36.0)
MCV: 86.5 fL (ref 80.0–100.0)
Monocytes Absolute: 0.6 10*3/uL (ref 0.1–1.0)
Monocytes Relative: 7 %
Neutro Abs: 7.1 10*3/uL (ref 1.7–7.7)
Neutrophils Relative %: 79 %
Platelets: 210 10*3/uL (ref 150–400)
RBC: 3.48 MIL/uL — ABNORMAL LOW (ref 3.87–5.11)
RDW: 13.3 % (ref 11.5–15.5)
WBC: 8.9 10*3/uL (ref 4.0–10.5)
nRBC: 0 % (ref 0.0–0.2)

## 2019-10-21 LAB — PROCALCITONIN: Procalcitonin: <0.1 ng/mL

## 2019-10-21 MED ORDER — BISACODYL 10 MG RE SUPP: 10.0000 mg | Freq: Every day | RECTAL | Status: DC | PRN

## 2019-10-21 MED ORDER — BISACODYL 5 MG PO TBEC: 10.0000 mg | DELAYED_RELEASE_TABLET | Freq: Every day | ORAL | Status: DC | PRN

## 2019-10-21 MED ORDER — FLEET ENEMA 7-19 GM/118ML RE ENEM: 1.0000 | ENEMA | Freq: Every day | RECTAL | Status: DC | PRN

## 2019-10-21 MED ORDER — IPRATROPIUM-ALBUTEROL 0.5-2.5 (3) MG/3ML IN SOLN
3.0000 mL | Freq: Four times a day (QID) | RESPIRATORY_TRACT | Status: DC
  Administered 2019-10-21 – 2019-10-22 (×4): 3 mL via RESPIRATORY_TRACT
  Filled 2019-10-21 (×3): qty 3

## 2019-10-21 NOTE — Progress Notes (Signed)
RN notified this RT that Pt removed her mask, pt placed on 4Lnc

## 2019-10-21 NOTE — Progress Notes (Signed)
Pt placed on Auto BiPAP for sleep at this time.

## 2019-10-21 NOTE — Progress Notes (Signed)
SUBJECTIVE: Patient complaining of shortness of breath this morning.  Patient was actively receiving a breathing treatment when assessed.  Patient denies chest pain and states her nausea from yesterday has resolved.   Vitals:   10/20/19 2317 10/21/19 0500 10/21/19 0819 10/21/19 0825  BP: 95/65   (!) 179/71  Pulse: 67  73 73  Resp: 16  18 16   Temp: 98.3 F (36.8 C)   97.6 F (36.4 C)  TempSrc:    Axillary  SpO2: 96%  91% 100%  Weight:  104.5 kg    Height:        Intake/Output Summary (Last 24 hours) at 10/21/2019 1003 Last data filed at 10/21/2019 0308 Gross per 24 hour  Intake --  Output 3350 ml  Net -3350 ml    LABS: Basic Metabolic Panel: Recent Labs    10/20/19 0556 10/21/19 0421  NA 124* 123*  K 4.3 4.8  CL 78* 76*  CO2 35* 38*  GLUCOSE 125* 151*  BUN 32* 36*  CREATININE 0.91 0.78  CALCIUM 8.4* 8.7*   Liver Function Tests: No results for input(s): AST, ALT, ALKPHOS, BILITOT, PROT, ALBUMIN in the last 72 hours. No results for input(s): LIPASE, AMYLASE in the last 72 hours. CBC: Recent Labs    10/20/19 0556 10/21/19 0421  WBC 7.8 8.9  NEUTROABS 5.1 7.1  HGB 9.6* 10.2*  HCT 29.2* 30.1*  MCV 88.5 86.5  PLT 161 210   Cardiac Enzymes: No results for input(s): CKTOTAL, CKMB, CKMBINDEX, TROPONINI in the last 72 hours. BNP: Invalid input(s): POCBNP D-Dimer: No results for input(s): DDIMER in the last 72 hours. Hemoglobin A1C: No results for input(s): HGBA1C in the last 72 hours. Fasting Lipid Panel: No results for input(s): CHOL, HDL, LDLCALC, TRIG, CHOLHDL, LDLDIRECT in the last 72 hours. Thyroid Function Tests: No results for input(s): TSH, T4TOTAL, T3FREE, THYROIDAB in the last 72 hours.  Invalid input(s): FREET3 Anemia Panel: No results for input(s): VITAMINB12, FOLATE, FERRITIN, TIBC, IRON, RETICCTPCT in the last 72 hours.   PHYSICAL EXAM General: Well developed, well nourished, in no acute distress HEENT:  Normocephalic and atramatic Neck:   No JVD.  Lungs: Expiratory wheezes throughout Heart: HRRR . Normal S1 and S2 without gallops or murmurs.  Abdomen: Bowel sounds are positive, abdomen soft and non-tender  Msk:  Back normal, normal gait. Normal strength and tone for age. Extremities: No clubbing, cyanosis or edema.   Neuro: Alert and oriented X 3. Psych:  Good affect, responds appropriately  TELEMETRY: NSR 77/bpm  ASSESSMENT AND PLAN: Patient presenting to the emergency department in respiratory distress requiring BiPAP. Patient's oxygen requirement has decreased back to 4 L nasal cannula but patient with worsening expiratory wheezes.  In discussing with respiratory therapist patient only wore CPAP for 3 hours last night.  Patient educated on the importance of wearing her CPAP all night.  Patient's edema has greatly improved but with her respiratory status as it is, would recommend continuing her 60 mg IV Lasix q12H. Patient's blood pressure with rebound hypertension today as most antihypertensives were held yesterday.  Please ensure patient receives her medications today andwith patient's combined HFpEF and HFrEF please continue low-dose beta-blocker. We will continue to follow patient.  Active Problems:   Acute on chronic combined systolic and diastolic congestive heart failure ([place])   Acute respiratory failure ([place])    Adaline Sill, NP-C 10/21/2019 10:03 AM

## 2019-10-21 NOTE — TOC Progression Note (Signed)
Transition of Care [facility]) - Progression Note    Patient Details  Name: Natalie Thomas MRN: [id number] Date of Birth: 07/07/1960  Transition of Care [facility]) CM/SW Contact  Su Hilt, RN Phone Number: 10/21/2019, 2:51 PM  Clinical Narrative:     The patient has DSS as a legal guardian and is a long term resident at [facility], she will return to [facility], she is now a DNR, to return to [facility] at [place], she is still wheezing and not ready to DC  Expected Discharge Plan: [place] Barriers to Discharge: Continued Medical Work up  Expected Discharge Plan and Services Expected Discharge Plan: [place]       Living arrangements for the past 2 months: [place]                                       Social Determinants of Health (SDOH) Interventions    Readmission Risk Interventions Readmission Risk Prevention Plan 10/19/2019 09/15/2019 09/08/2019  Transportation Screening Complete Complete Complete  Medication Review Press photographer) Complete Complete Complete  PCP or Specialist appointment within 3-5 days of discharge Complete Complete Complete  HRI or Home Care Consult Complete Complete Complete  [place] or Home Care Consult Pt Refusal Comments - - -  SW Recovery Care/Counseling Consult - - Complete  Palliative Care Screening - - Not Applicable  Skilled Nursing Facility Complete Complete Complete  Some recent data might be hidden

## 2019-10-21 NOTE — Progress Notes (Signed)
*  PRELIMINARY RESULTS* Echocardiogram 2D Echocardiogram has been performed.  Sherrie Sport 10/21/2019, 10:05 AM

## 2019-10-21 NOTE — Progress Notes (Signed)
PROGRESS NOTE    Natalie Thomas  [id number] DOB: 03-07-1961 DOA: 10/18/2019 PCP: System, Pcp Not In   Brief Narrative:  Natalie Thomas  is a 59 y.o. 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   She was in respiratory distress initially requiring BiPAP, weaned off by this morning and back to her baseline of 4 L.  Being treated for acute on chronic heart failure.  Subjective:  Patient denies any new complaints.  Denies new shortness of breath.  Admits that she mostly lies in bed but denies shortness of breath with speaking or eating.  We discussed her dropping sodium and my concern that she might be drinking too much fluid. Patient is upset when I told her that we need to decrease how much fluid she was drinking.  She shouted "I need to drink, I need to drink."  She also said that her fruit juice helped her with constipation and that constipation was a problem for her.   Assessment & Plan:   Active Problems:   Acute on chronic combined systolic and diastolic congestive heart failure (HCC)   Acute respiratory failure (HCC)  Acute on chronic respiratory failure secondary  Thought to be secondary to acute on chronic combined heart failure as well as COPD exacerbation.  Patient also has marked obesity and barrel chest with clearly some restrictive lung disease as well.  Acute on chronic combined heart failure.  Patient has been diuresing well on Lasix 60 IV twice daily Weight has decreased from 110 to 104 but stable overnight. Appreciate ongoing cardiology consultation. Patient seems to be tolerating beta-blocker which was initiated during this hospitalization, she had previously  developed asymptomatic bradycardia in the past on a beta-blocker. Continue lisinopril. Florinef was recently discontinued, consideration of midodrine if blood pressure drops per previous notes.  COPD exacerbation.   Patient has been tapered down to Solu-Medrol 60 mg daily  Patient has an expiratory wheeze which may be upper respiratory rather than bronchial. We will continue inhaled bronchodilators and Solu-Medrol at present dose. Procalcitonin less than 0.1, antibiotics were discontinued.  Hyponatremia.   Sodium decreased to 123 today, down from 125 on admission and 129 earlier this month. Patient has an history of chronic hyponatremia most likely secondary to psychogenic polydipsia. Will place patient on 1200 cc fluid restriction.  When I went to her bedside she had a jug full of water and her breakfast tray had 2 cups of coffee and 2 containers of juice.  Discussed with RN that she should not be having any water at bedside.  She seems to be asymptomatic from hyponatremia standpoint. Follow sodium carefully.  Hypertension.   Blood pressure within goal, with at times some softer readings. Continue Lasix, spironolactone, nifedipine and lisinopril  Positive blood culture.   1 out of 4 bottles were growing coagulase-negative Staphylococcus, methicillin-resistant.   Most likely a contaminant as patient remained afebrile, no leukocytosis and negative procalcitonin.  No need for antibiotics for now.  Hypokalemia.   Resolved Continue to monitor and replete as needed.  Schizoaffective bipolar disorder. Continue Zyprexa, Depakene, Latuda, Wellbutrin and Klonopin  Type 2 diabetes mellitus.  CBG within goal. CBG under good control. Continue with SSI.  Objective: Vitals:   10/21/19 0500 10/21/19 0819 10/21/19  0825 10/21/19 1351  BP:   (!) 179/71   Pulse:  73 73 74  Resp:  18 16 18   Temp:   97.6 F (36.4 C)   TempSrc:   Axillary   SpO2:  91% 100% 100%  Weight: 104.5 kg     Height:         Intake/Output Summary (Last 24 hours) at 10/21/2019 1628 Last data filed at 10/21/2019 1326 Gross per 24 hour  Intake 240 ml  Output 3300 ml  Net -3060 ml   Filed Weights   10/18/19 1955 10/20/19 0406 10/21/19 0500  Weight: 110 kg 104.6 kg 104.5 kg    Examination:  General exam: Obese, barrel chested female sitting up in bed trying to eat breakfast.   Respiratory system: She has a expiratory wheeze which may be transmitted upper respiratory sound versus alveolar.  No increased work of breathing.  Able to speak in full sentences without difficulty.  Reasonable air entry.  She does have rales at bases. cardiovascular system: S1 & S2 heard, RRR.  Gastrointestinal system: Obese, firm but not hard, nontender, nondistended, bowel sounds positive. Central nervous system: Alert and oriented. No focal neurological deficits.  DVT prophylaxis: Lovenox Code Status: DNR-there was some discrepancy between DNR and full code, cleared from [place] services that patient has legally signed papers for DNR. Family Communication: Patient has a guardian. Disposition Plan:  Status is: Inpatient  Remains inpatient appropriate because:IV treatments appropriate due to intensity of illness or inability to take PO and Inpatient level of care appropriate due to severity of illness   Dispo: The patient is from: SNF              Anticipated d/c is to: SNF              Anticipated d/c date is: 1 day              Patient currently is not medically stable to d/c.  Patient with multiple recent admissions with acute on chronic respiratory failure secondary to COPD and CHF exacerbation.  Very high risk for readmission, deterioration and death. -Palliative care was consulted.  Consultants:   Cardiology  Palliative Care  Procedures:  Antimicrobials:   Data Reviewed: I have personally reviewed following labs and imaging studies  CBC: Recent Labs  Lab 10/18/19 2002 10/19/19 0425  10/20/19 0556 10/21/19 0421  WBC 11.0* 6.8 7.8 8.9  NEUTROABS 8.7* 6.3 5.1 7.1  HGB 10.6* 9.9* 9.6* 10.2*  HCT 33.3* 29.3* 29.2* 30.1*  MCV 90.7 87.2 88.5 86.5  PLT 162 147* 161 [id number]   Basic Metabolic Panel: Recent Labs  Lab 10/18/19 2002 10/19/19 0425 10/20/19 0556 10/21/19 0421  NA 124* 125* 124* 123*  K 4.0 4.8 4.3 4.8  CL 83* 81* 78* 76*  CO2 30 33* 35* 38*  GLUCOSE 128* 166* 125* 151*  BUN 13 15 32* 36*  CREATININE 0.78 0.68 0.91 0.78  CALCIUM 8.5* 8.5* 8.4* 8.7*   GFR: Estimated Creatinine Clearance: 85.3 mL/min (by C-G formula based on SCr of 0.78 mg/dL). Liver Function Tests: No results for input(s): AST, ALT, ALKPHOS, BILITOT, PROT, ALBUMIN in the last 168 hours. No results for input(s): LIPASE, AMYLASE in the last 168 hours. No results for input(s): AMMONIA in the last 168 hours. Coagulation Profile: No results for input(s): INR, PROTIME in the last 168 hours. Cardiac Enzymes: No results for input(s): CKTOTAL, CKMB, CKMBINDEX, TROPONINI in the last 168 hours. BNP (last 3 results) No results for  input(s): PROBNP in the last 8760 hours. HbA1C: No results for input(s): HGBA1C in the last 72 hours. CBG: Recent Labs  Lab 10/19/19 0322  GLUCAP 157*   Lipid Profile: No results for input(s): CHOL, HDL, LDLCALC, TRIG, CHOLHDL, LDLDIRECT in the last 72 hours. Thyroid Function Tests: No results for input(s): TSH, T4TOTAL, FREET4, T3FREE, THYROIDAB in the last 72 hours. Anemia Panel: No results for input(s): VITAMINB12, FOLATE, FERRITIN, TIBC, IRON, RETICCTPCT in the last 72 hours. Sepsis Labs: Recent Labs  Lab 10/19/19 0425 10/20/19 0556 10/21/19 0421  PROCALCITON <0.10 <0.10 <0.10    Recent Results (from the past 240 hour(s))  Respiratory Panel by RT PCR (Flu A&B, Covid) - Nasopharyngeal Swab     Status: None   Collection Time: 10/18/19  8:56 PM   Specimen: Nasopharyngeal Swab  Result Value Ref Range Status   SARS Coronavirus 2 by RT PCR NEGATIVE  NEGATIVE Final    Comment: (NOTE) SARS-CoV-2 target nucleic acids are NOT DETECTED. The SARS-CoV-2 RNA is generally detectable in upper respiratoy specimens during the acute phase of infection. The lowest concentration of SARS-CoV-2 viral copies this assay can detect is 131 copies/mL. A negative result does not preclude SARS-Cov-2 infection and should not be used as the sole basis for treatment or other patient management decisions. A negative result may occur with  improper specimen collection/handling, submission of specimen other than nasopharyngeal swab, presence of viral mutation(s) within the areas targeted by this assay, and inadequate number of viral copies (<131 copies/mL). A negative result must be combined with clinical observations, patient history, and epidemiological information. The expected result is Negative. Fact Sheet for Patients:  PinkCheek.be Fact Sheet for Healthcare Providers:  GravelBags.it This test is not yet ap proved or cleared by the [place] FDA and  has been authorized for detection and/or diagnosis of SARS-CoV-2 by FDA under an Emergency Use Authorization (EUA). This EUA will remain  in effect (meaning this test can be used) for the duration of the COVID-19 declaration under Section 564(b)(1) of the Act, 21 U.S.C. section 360bbb-3(b)(1), unless the authorization is terminated or revoked sooner.    Influenza A by PCR NEGATIVE NEGATIVE Final   Influenza B by PCR NEGATIVE NEGATIVE Final    Comment: (NOTE) The Xpert Xpress SARS-CoV-2/FLU/RSV assay is intended as an aid in  the diagnosis of influenza from Nasopharyngeal swab specimens and  should not be used as a sole basis for treatment. Nasal washings and  aspirates are unacceptable for Xpert Xpress SARS-CoV-2/FLU/RSV  testing. Fact Sheet for Patients: PinkCheek.be Fact Sheet for Healthcare  Providers: GravelBags.it This test is not yet approved or cleared by the [place] FDA and  has been authorized for detection and/or diagnosis of SARS-CoV-2 by  FDA under an Emergency Use Authorization (EUA). This EUA will remain  in effect (meaning this test can be used) for the duration of the  Covid-19 declaration under Section 564(b)(1) of the Act, 21  U.S.C. section 360bbb-3(b)(1), unless the authorization is  terminated or revoked. Performed at [facility], [address] [place], [place] [address]   CULTURE, BLOOD (ROUTINE X 2) w Reflex to ID Panel     Status: Abnormal (Preliminary result)   Collection Time: 10/18/19 11:42 PM   Specimen: BLOOD  Result Value Ref Range Status   Specimen Description   Final    BLOOD BLOOD LEFT FOREARM Performed at [facility], [address] [place], [place] [address]    Special Requests   Final  BOTTLES DRAWN AEROBIC AND ANAEROBIC Blood Culture results may not be optimal due to an inadequate volume of blood received in culture bottles Performed at [facility], [address] [place], [place] [address]    Culture  Setup Time   Final    GRAM POSITIVE COCCI IN BOTH AEROBIC AND ANAEROBIC BOTTLES CRITICAL RESULT CALLED TO, READ BACK BY AND VERIFIED WITH: ABBY ELLINGTON AT [id number] 4826/21 SDR    Culture (A)  Final    STAPHYLOCOCCUS SPECIES (COAGULASE NEGATIVE) THE SIGNIFICANCE OF ISOLATING THIS ORGANISM FROM A SINGLE SET OF BLOOD CULTURES WHEN MULTIPLE SETS ARE DRAWN IS UNCERTAIN. PLEASE NOTIFY THE MICROBIOLOGY DEPARTMENT WITHIN ONE WEEK IF SPECIATION AND SENSITIVITIES ARE REQUIRED. Performed at [facility], [place] [address], [place], [place] [address]    Report Status PENDING  Incomplete  Blood Culture ID Panel (Reflexed)     Status: Abnormal   Collection Time: 10/18/19 11:42 PM  Result Value Ref Range Status   Enterococcus species NOT DETECTED NOT DETECTED Final    Listeria monocytogenes NOT DETECTED NOT DETECTED Final   Staphylococcus species DETECTED (A) NOT DETECTED Final    Comment: Methicillin (oxacillin) resistant coagulase negative staphylococcus. Possible blood culture contaminant (unless isolated from more than one blood culture draw or clinical case suggests pathogenicity). No antibiotic treatment is indicated for blood  culture contaminants. CRITICAL RESULT CALLED TO, READ BACK BY AND VERIFIED WITH:  ABBY ELLINGTON AT [id number] 10/19/19 SDR    Staphylococcus aureus (BCID) NOT DETECTED NOT DETECTED Final   Methicillin resistance DETECTED (A) NOT DETECTED Final    Comment: CRITICAL RESULT CALLED TO, READ BACK BY AND VERIFIED WITH:  ABBY ELLINGTON AT [id number] 10/19/19 SDR    Streptococcus species NOT DETECTED NOT DETECTED Final   Streptococcus agalactiae NOT DETECTED NOT DETECTED Final   Streptococcus pneumoniae NOT DETECTED NOT DETECTED Final   Streptococcus pyogenes NOT DETECTED NOT DETECTED Final   Acinetobacter baumannii NOT DETECTED NOT DETECTED Final   Enterobacteriaceae species NOT DETECTED NOT DETECTED Final   Enterobacter cloacae complex NOT DETECTED NOT DETECTED Final   Escherichia coli NOT DETECTED NOT DETECTED Final   Klebsiella oxytoca NOT DETECTED NOT DETECTED Final   Klebsiella pneumoniae NOT DETECTED NOT DETECTED Final   Proteus species NOT DETECTED NOT DETECTED Final   Serratia marcescens NOT DETECTED NOT DETECTED Final   Haemophilus influenzae NOT DETECTED NOT DETECTED Final   Neisseria meningitidis NOT DETECTED NOT DETECTED Final   Pseudomonas aeruginosa NOT DETECTED NOT DETECTED Final   Candida albicans NOT DETECTED NOT DETECTED Final   Candida glabrata NOT DETECTED NOT DETECTED Final   Candida krusei NOT DETECTED NOT DETECTED Final   Candida parapsilosis NOT DETECTED NOT DETECTED Final   Candida tropicalis NOT DETECTED NOT DETECTED Final    Comment: Performed at [facility], [address] [place], [place] [address]   CULTURE, BLOOD (ROUTINE X 2) w Reflex to ID Panel     Status: None (Preliminary result)   Collection Time: 10/19/19  4:24 AM   Specimen: BLOOD  Result Value Ref Range Status   Specimen Description BLOOD BLOOD RIGHT WRIST  Final   Special Requests   Final    BOTTLES DRAWN AEROBIC AND ANAEROBIC Blood Culture results may not be optimal due to an excessive volume of blood received in culture bottles   Culture   Final    NO GROWTH 2 DAYS Performed at [facility], [address], [place], [place] [address]    Report Status  PENDING  Incomplete  MRSA PCR Screening     Status: None   Collection Time: 10/19/19  2:00 PM   Specimen: Nasal Mucosa; Nasopharyngeal  Result Value Ref Range Status   MRSA by PCR NEGATIVE NEGATIVE Final    Comment:        The GeneXpert MRSA Assay (FDA approved for NASAL specimens only), is one component of a comprehensive MRSA colonization surveillance program. It is not intended to diagnose MRSA infection nor to guide or monitor treatment for MRSA infections. Performed at [facility], [address], [place], [place] [address]      Radiology Studies: No results found.  Scheduled Meds: . ascorbic acid  1,000 mg Oral Daily  . aspirin EC  81 mg Oral Daily  . buPROPion  150 mg Oral Daily  . calcium-vitamin D  1 tablet Oral Daily  . Chlorhexidine Gluconate Cloth  6 each Topical Daily  . clonazePAM  1 mg Oral BID  . docusate sodium  100 mg Oral BID  . enoxaparin (LOVENOX) injection  40 mg Subcutaneous Q12H  . feeding supplement (ENSURE ENLIVE)  237 mL Oral Q24H  . furosemide  60 mg Intravenous Q12H  . guaiFENesin  600 mg Oral BID  . ipratropium-albuterol  3 mL Nebulization Q6H  . lisinopril  10 mg Oral Daily  . loratadine  10 mg Oral Daily  . lurasidone  20 mg Oral BID  . melatonin  5 mg Oral QHS  . methylPREDNISolone (SOLU-MEDROL) injection  60 mg Intravenous Daily  . metoprolol succinate  12.5 mg Oral Daily  . multivitamin with  minerals  1 tablet Oral Daily  . nicotine  21 mg Transdermal Daily  . NIFEdipine  60 mg Oral Daily  . OLANZapine  10 mg Oral BID  . pantoprazole  40 mg Oral Daily  . polyethylene glycol  17 g Oral Daily  . sodium chloride flush  3 mL Intravenous Q12H  . spironolactone  25 mg Oral Daily  . sucralfate  1 g Oral TID WC & HS  . valproic acid  500 mg Oral TID   Continuous Infusions: . sodium chloride       LOS: 3 days   Time spent: 45 minutes.  Vashti Hey, MD [facility]  If 7PM-7AM, please contact night-coverage [email or web address]  10/21/2019, 4:28 PM   This record has been created using Systems analyst. Errors have been sought and corrected,but may not always be located. Such creation errors do not reflect on the standard of care.

## 2019-10-21 NOTE — Care Management Important Message (Signed)
Important Message  Patient Details  Name: Natalie Thomas MRN: [id number] Date of Birth: 05/20/1961   Medicare Important Message Given:  N/A - LOS <3 / Initial given by admissions     Juliann Pulse A [place] Rabenold 10/21/2019, 8:03 AM

## 2019-10-22 LAB — CBC WITH DIFFERENTIAL/PLATELET
Abs Immature Granulocytes: 0.04 10*3/uL (ref 0.00–0.07)
Basophils Absolute: 0 10*3/uL (ref 0.0–0.1)
Basophils Relative: 0 %
Eosinophils Absolute: 0 10*3/uL (ref 0.0–0.5)
Eosinophils Relative: 0 %
HCT: 32.3 % — ABNORMAL LOW (ref 36.0–46.0)
Hemoglobin: 10.3 g/dL — ABNORMAL LOW (ref 12.0–15.0)
Immature Granulocytes: 1 %
Lymphocytes Relative: 22 %
Lymphs Abs: 1.9 10*3/uL (ref 0.7–4.0)
MCH: 28.5 pg (ref 26.0–34.0)
MCHC: 31.9 g/dL (ref 30.0–36.0)
MCV: 89.5 fL (ref 80.0–100.0)
Monocytes Absolute: 0.7 10*3/uL (ref 0.1–1.0)
Monocytes Relative: 8 %
Neutro Abs: 6 10*3/uL (ref 1.7–7.7)
Neutrophils Relative %: 69 %
Platelets: 233 10*3/uL (ref 150–400)
RBC: 3.61 MIL/uL — ABNORMAL LOW (ref 3.87–5.11)
RDW: 13.4 % (ref 11.5–15.5)
WBC: 8.8 10*3/uL (ref 4.0–10.5)
nRBC: 0 % (ref 0.0–0.2)

## 2019-10-22 LAB — BASIC METABOLIC PANEL
Anion gap: 10 (ref 5–15)
BUN: 50 mg/dL — ABNORMAL HIGH (ref 6–20)
CO2: 39 mmol/L — ABNORMAL HIGH (ref 22–32)
Calcium: 8.7 mg/dL — ABNORMAL LOW (ref 8.9–10.3)
Chloride: 77 mmol/L — ABNORMAL LOW (ref 98–111)
Creatinine, Ser: 0.79 mg/dL (ref 0.44–1.00)
GFR calc Af Amer: >60 mL/min (ref >60)
GFR calc non Af Amer: >60 mL/min (ref >60)
Glucose, Bld: 90 mg/dL (ref 70–99)
Potassium: 5.4 mmol/L — ABNORMAL HIGH (ref 3.5–5.1)
Sodium: 126 mmol/L — ABNORMAL LOW (ref 135–145)

## 2019-10-22 LAB — CULTURE, BLOOD (ROUTINE X 2)

## 2019-10-22 LAB — ECHOCARDIOGRAM COMPLETE
Height: 61 in
Weight: 3686.09 oz

## 2019-10-22 MED ORDER — IPRATROPIUM-ALBUTEROL 0.5-2.5 (3) MG/3ML IN SOLN
3.0000 mL | Freq: Three times a day (TID) | RESPIRATORY_TRACT | Status: DC
  Administered 2019-10-22 – 2019-10-26 (×11): 3 mL via RESPIRATORY_TRACT
  Filled 2019-10-22 (×12): qty 3

## 2019-10-22 MED ORDER — IPRATROPIUM-ALBUTEROL 0.5-2.5 (3) MG/3ML IN SOLN
RESPIRATORY_TRACT | Status: AC
  Filled 2019-10-22: qty 3

## 2019-10-22 NOTE — Care Management Important Message (Signed)
Important Message  Patient Details  Name: Natalie Thomas MRN: [id number] Date of Birth: Nov 19, 1960   Medicare Important Message Given:  Yes     Juliann Pulse A Tamara Kenyon 10/22/2019, 10:30 AM

## 2019-10-22 NOTE — Progress Notes (Addendum)
PROGRESS NOTE    Natalie Thomas  [id number] DOB: 1961/01/20 DOA: 10/18/2019 PCP: System, Pcp Not In   Brief Narrative:  Natalie Thomas  is a 59 y.o. 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   She was in respiratory distress initially requiring BiPAP, weaned off by this morning and back to her baseline of 4 L.  Being treated for acute on chronic heart failure.  Subjective:  She states she is not feeling well today.  Would like to have her breakfast.  Not sure why it has not arrived yet.  Denies change in baseline shortness of breath.   Assessment & Plan:   Active Problems:   Acute on chronic combined systolic and diastolic congestive heart failure (HCC)   Acute respiratory failure (HCC)  Acute on chronic respiratory failure secondary  Thought to be secondary to acute on chronic combined heart failure as well as COPD exacerbation.  Patient also has marked obesity and barrel chest with clearly some restrictive lung disease as well.  Acute on chronic combined heart failure.  Patient has been diuresing well on Lasix 60 IV twice daily Weight has decreased from 110.  She lost another kilo overnight down to 103.  Will change to Lasix 60 p.o. twice daily per cardiology recommendations. They note echocardiogram with EF to 50 to 55% with global hypokinesis and grade 1 diastolic dysfunction, much improved from last month.  Patient initiated on Toprol XL 12.5 daily which she is tolerating well.   She had previously developed symptomatic bradycardia on a beta-blocker, follow heart rate closely. Continue lisinopril. Florinef was recently discontinued, consideration of midodrine if blood  pressure drops per previous notes.  Hyperkalemia Calcium has been rising consistently, will continue spironolactone today and follow. While she would benefit from the spironolactone from a cardiac standpoint, hyperkalemia may occlude.  COPD exacerbation.   Patient has been tapered down to Solu-Medrol 60 mg daily  Wheezing is much less today than it was yesterday.   We will continue inhaled bronchodilators and Solu-Medrol at present dose. Procalcitonin less than 0.1, antibiotics were discontinued.  Hyponatremia.   Sodium improved to 126 today with 1200 cc fluid restriction initiated yesterday. Stressed to nurses that she is not to have any water at bedside. Sodium had been 129 earlier this month. Patient has an history of chronic hyponatremia most likely secondary to psychogenic polydipsia. She seems to be asymptomatic from hyponatremia standpoint. Follow sodium carefully.  Hypertension.   Blood pressure within goal, with at times some softer readings. Continue Lasix, Toprol-XL, nifedipine and lisinopril. As noted above spironolactone was discontinued today due to hyperkalemia.  Positive blood culture.   1 out of 4 bottles were growing coagulase-negative Staphylococcus, methicillin-resistant.   Most likely a contaminant as patient remained afebrile, no leukocytosis and negative procalcitonin.  No need for antibiotics for now.  Hypokalemia.   Resolved Continue to monitor and replete as needed.  Schizoaffective bipolar disorder. Continue Zyprexa, Depakene, Latuda, Wellbutrin and Klonopin  Type 2 diabetes mellitus.  CBG within goal. CBG under good control. Continue with SSI.  Objective: Vitals:   10/22/19 0700 10/22/19 0726 10/22/19 0756 10/22/19 1440  BP:  100/67    Pulse:  64 65 66  Resp:  18 16 16   Temp:  97.6 F (36.4 C)    TempSrc:  Oral    SpO2:  91% 92% 90%  Weight: 103.2 kg     Height:        Intake/Output Summary (Last 24 hours) at 10/22/2019 1519 Last data  filed at 10/22/2019 1350 Gross per 24 hour  Intake 1167 ml  Output 2450 ml  Net -1283 ml   Filed Weights   10/21/19 0500 10/22/19 0500 10/22/19 0700  Weight: 104.5 kg 103 kg 103.2 kg    Examination:  General exam: Obese, barrel chested female lying in bed at 20 degrees in NAD. Respiratory system: Rare expiratory wheeze.  Reasonable air entry bilaterally.  No increased work of breathing.  Able to speak in full sentences without difficulty.   She does have rales at bases. cardiovascular system: S1 & S2 heard, RRR.  Gastrointestinal system: Obese, firm but not hard, nontender, nondistended, bowel sounds positive. Central nervous system: Alert and oriented. No focal neurological deficits.  DVT prophylaxis: Lovenox Code Status: DNR-there was some discrepancy between DNR and full code, cleared from [facility] services that patient has legally signed papers for DNR. Family Communication: Patient has a guardian. Disposition Plan:  Status is: Inpatient  Remains inpatient appropriate because: Transitioning over to oral diuretics today.  Can consider discharge in the morning.  Dispo: The patient is from: SNF              Anticipated d/c is to: SNF              Anticipated d/c date is: 1 day              Patient currently is not medically stable to d/c.  Patient with multiple recent admissions with acute on chronic respiratory failure secondary to COPD and CHF exacerbation.  Very high risk for readmission, deterioration and death. -Palliative care was consulted.  Consultants:   Cardiology  Palliative Care  Procedures:  Antimicrobials:   Data Reviewed: I have personally reviewed following labs and imaging studies  CBC: Recent Labs  Lab 10/18/19 2002 10/19/19 0425 10/20/19 0556 10/21/19 0421 10/22/19 0440  WBC 11.0* 6.8 7.8 8.9 8.8  NEUTROABS 8.7* 6.3 5.1 7.1 6.0  HGB 10.6* 9.9* 9.6* 10.2* 10.3*  HCT 33.3* 29.3* 29.2* 30.1* 32.3*  MCV 90.7 87.2 88.5 86.5 89.5  PLT  162 147* 161 210 0000000   Basic Metabolic Panel: Recent Labs  Lab 10/18/19 2002 10/19/19 0425 10/20/19 0556 10/21/19 0421 10/22/19 0440  NA 124* 125* 124* 123* 126*  K 4.0 4.8 4.3 4.8 5.4*  CL 83* 81* 78* 76* 77*  CO2 30 33* 35* 38* 39*  GLUCOSE 128* 166* 125* 151* 90  BUN 13 15 32* 36* 50*  CREATININE 0.78 0.68 0.91 0.78 0.79  CALCIUM 8.5* 8.5* 8.4* 8.7* 8.7*   GFR: Estimated Creatinine Clearance: 84.7 mL/min (by C-G formula based on SCr of 0.79 mg/dL). Liver Function Tests: No results for input(s): AST, ALT, ALKPHOS, BILITOT, PROT, ALBUMIN in the last 168 hours. No results for input(s): LIPASE, AMYLASE in the last 168 hours. No results for input(s): AMMONIA in the last 168 hours. Coagulation Profile: No results for input(s): INR, PROTIME in the last 168 hours. Cardiac Enzymes: No results for input(s): CKTOTAL, CKMB, CKMBINDEX, TROPONINI in the last 168 hours. BNP (last 3 results) No results for input(s): PROBNP in the last 8760 hours. HbA1C: No results for input(s): HGBA1C in the last 72 hours. CBG: Recent Labs  Lab 10/19/19 0322  GLUCAP 157*  Lipid Profile: No results for input(s): CHOL, HDL, LDLCALC, TRIG, CHOLHDL, LDLDIRECT in the last 72 hours. Thyroid Function Tests: No results for input(s): TSH, T4TOTAL, FREET4, T3FREE, THYROIDAB in the last 72 hours. Anemia Panel: No results for input(s): VITAMINB12, FOLATE, FERRITIN, TIBC, IRON, RETICCTPCT in the last 72 hours. Sepsis Labs: Recent Labs  Lab 10/19/19 0425 10/20/19 0556 10/21/19 0421  PROCALCITON <0.10 <0.10 <0.10    Recent Results (from the past 240 hour(s))  Respiratory Panel by RT PCR (Flu A&B, Covid) - Nasopharyngeal Swab     Status: None   Collection Time: 10/18/19  8:56 PM   Specimen: Nasopharyngeal Swab  Result Value Ref Range Status   SARS Coronavirus 2 by RT PCR NEGATIVE NEGATIVE Final    Comment: (NOTE) SARS-CoV-2 target nucleic acids are NOT DETECTED. The SARS-CoV-2 RNA is generally  detectable in upper respiratoy specimens during the acute phase of infection. The lowest concentration of SARS-CoV-2 viral copies this assay can detect is 131 copies/mL. A negative result does not preclude SARS-Cov-2 infection and should not be used as the sole basis for treatment or other patient management decisions. A negative result may occur with  improper specimen collection/handling, submission of specimen other than nasopharyngeal swab, presence of viral mutation(s) within the areas targeted by this assay, and inadequate number of viral copies (<131 copies/mL). A negative result must be combined with clinical observations, patient history, and epidemiological information. The expected result is Negative. Fact Sheet for Patients:  PinkCheek.be Fact Sheet for Healthcare Providers:  GravelBags.it This test is not yet ap proved or cleared by the [place] FDA and  has been authorized for detection and/or diagnosis of SARS-CoV-2 by FDA under an Emergency Use Authorization (EUA). This EUA will remain  in effect (meaning this test can be used) for the duration of the COVID-19 declaration under Section 564(b)(1) of the Act, 21 U.S.C. section 360bbb-3(b)(1), unless the authorization is terminated or revoked sooner.    Influenza A by PCR NEGATIVE NEGATIVE Final   Influenza B by PCR NEGATIVE NEGATIVE Final    Comment: (NOTE) The Xpert Xpress SARS-CoV-2/FLU/RSV assay is intended as an aid in  the diagnosis of influenza from Nasopharyngeal swab specimens and  should not be used as a sole basis for treatment. Nasal washings and  aspirates are unacceptable for Xpert Xpress SARS-CoV-2/FLU/RSV  testing. Fact Sheet for Patients: PinkCheek.be Fact Sheet for Healthcare Providers: GravelBags.it This test is not yet approved or cleared by the [place] FDA and  has been  authorized for detection and/or diagnosis of SARS-CoV-2 by  FDA under an Emergency Use Authorization (EUA). This EUA will remain  in effect (meaning this test can be used) for the duration of the  Covid-19 declaration under Section 564(b)(1) of the Act, 21  U.S.C. section 360bbb-3(b)(1), unless the authorization is  terminated or revoked. Performed at [facility], [address] [place], [place] [address]   CULTURE, BLOOD (ROUTINE X 2) w Reflex to ID Panel     Status: Abnormal   Collection Time: 10/18/19 11:42 PM   Specimen: BLOOD  Result Value Ref Range Status   Specimen Description   Final    BLOOD BLOOD LEFT FOREARM Performed at [facility], [address] [place], [place] [address]    Special Requests   Final    BOTTLES DRAWN AEROBIC AND ANAEROBIC Blood Culture results may not be optimal due to an inadequate volume of blood received in culture bottles Performed at [facility], [address] [place], [place] [address]    Culture  Setup Time   Final    GRAM POSITIVE COCCI IN BOTH AEROBIC AND ANAEROBIC BOTTLES CRITICAL RESULT CALLED TO, READ BACK BY AND VERIFIED WITH: ABBY ELLINGTON AT [id number] 4826/21 SDR    Culture (A)  Final    STAPHYLOCOCCUS SPECIES (COAGULASE NEGATIVE) THE SIGNIFICANCE OF ISOLATING THIS ORGANISM FROM A SINGLE SET OF BLOOD CULTURES WHEN MULTIPLE SETS ARE DRAWN IS UNCERTAIN. PLEASE NOTIFY THE MICROBIOLOGY DEPARTMENT WITHIN ONE WEEK IF SPECIATION AND SENSITIVITIES ARE REQUIRED. Performed at [facility], [place] [address], [place], [place] [address]    Report Status 10/22/2019 FINAL  Final  Blood Culture ID Panel (Reflexed)     Status: Abnormal   Collection Time: 10/18/19 11:42 PM  Result Value Ref Range Status   Enterococcus species NOT DETECTED NOT DETECTED Final   Listeria monocytogenes NOT DETECTED NOT DETECTED Final   Staphylococcus species DETECTED (A) NOT DETECTED Final    Comment: Methicillin (oxacillin) resistant  coagulase negative staphylococcus. Possible blood culture contaminant (unless isolated from more than one blood culture draw or clinical case suggests pathogenicity). No antibiotic treatment is indicated for blood  culture contaminants. CRITICAL RESULT CALLED TO, READ BACK BY AND VERIFIED WITH:  ABBY ELLINGTON AT [id number] 10/19/19 SDR    Staphylococcus aureus (BCID) NOT DETECTED NOT DETECTED Final   Methicillin resistance DETECTED (A) NOT DETECTED Final    Comment: CRITICAL RESULT CALLED TO, READ BACK BY AND VERIFIED WITH:  ABBY ELLINGTON AT [id number] 10/19/19 SDR    Streptococcus species NOT DETECTED NOT DETECTED Final   Streptococcus agalactiae NOT DETECTED NOT DETECTED Final   Streptococcus pneumoniae NOT DETECTED NOT DETECTED Final   Streptococcus pyogenes NOT DETECTED NOT DETECTED Final   Acinetobacter baumannii NOT DETECTED NOT DETECTED Final   Enterobacteriaceae species NOT DETECTED NOT DETECTED Final   Enterobacter cloacae complex NOT DETECTED NOT DETECTED Final   Escherichia coli NOT DETECTED NOT DETECTED Final   Klebsiella oxytoca NOT DETECTED NOT DETECTED Final   Klebsiella pneumoniae NOT DETECTED NOT DETECTED Final   Proteus species NOT DETECTED NOT DETECTED Final   Serratia marcescens NOT DETECTED NOT DETECTED Final   Haemophilus influenzae NOT DETECTED NOT DETECTED Final   Neisseria meningitidis NOT DETECTED NOT DETECTED Final   Pseudomonas aeruginosa NOT DETECTED NOT DETECTED Final   Candida albicans NOT DETECTED NOT DETECTED Final   Candida glabrata NOT DETECTED NOT DETECTED Final   Candida krusei NOT DETECTED NOT DETECTED Final   Candida parapsilosis NOT DETECTED NOT DETECTED Final   Candida tropicalis NOT DETECTED NOT DETECTED Final    Comment: Performed at [facility], [address] [place], [place] [address]  CULTURE, BLOOD (ROUTINE X 2) w Reflex to ID Panel     Status: None (Preliminary result)   Collection Time: 10/19/19  4:24 AM   Specimen: BLOOD  Result  Value Ref Range Status   Specimen Description BLOOD BLOOD RIGHT WRIST  Final   Special Requests   Final    BOTTLES DRAWN AEROBIC AND ANAEROBIC Blood Culture results may not be optimal due to an excessive volume of blood received in culture bottles   Culture   Final    NO GROWTH 3 DAYS Performed at [facility], [address] [place], [place] [address]    Report Status PENDING  Incomplete  MRSA PCR Screening     Status: None   Collection Time: 10/19/19  2:00 PM   Specimen: Nasal Mucosa; Nasopharyngeal  Result  Value Ref Range Status   MRSA by PCR NEGATIVE NEGATIVE Final    Comment:        The GeneXpert MRSA Assay (FDA approved for NASAL specimens only), is one component of a comprehensive MRSA colonization surveillance program. It is not intended to diagnose MRSA infection nor to guide or monitor treatment for MRSA infections. Performed at [facility], [address] [place], [place] [address]      Radiology Studies: ECHOCARDIOGRAM COMPLETE  Result Date: 10/22/2019    ECHOCARDIOGRAM REPORT   Patient Name:   ZANDRIA LAFRANCE Date of Exam: 10/21/2019 Medical Rec #:  [id number]          Height:       61.0 in Accession #:    [id number]         Weight:       230.4 lb Date of Birth:  [birth year]           BSA:          2.006 m Patient Age:    32 years           BP:           179/71 mmHg Patient Gender: F                  HR:           73 bpm. Exam Location:  [facility] Procedure: 2D Echo, Cardiac Doppler and Color Doppler Indications:     CHF- acute diastolic [id number], CHF-acute systolic [id number]  History:         Patient has prior history of Echocardiogram examinations, most                  recent 09/14/2019. CHF, COPD; Risk Factors:Hypertension.  Sonographer:     Sherrie Sport RDCS (AE) Referring Phys:  [id number] [place] Diagnosing Phys: Neoma Laming MD  Sonographer Comments: Technically challenging study due to limited acoustic windows, no apical window and no subcostal  window. IMPRESSIONS  1. Left ventricular ejection fraction, by estimation, is 50 to 55%. The left ventricle has low normal function. The left ventricle demonstrates global hypokinesis. Left ventricular diastolic parameters are consistent with Grade I diastolic dysfunction (impaired relaxation).  2. Right ventricular systolic function is normal. The right ventricular size is normal.  3. The mitral valve is normal in structure. Mild to moderate mitral valve regurgitation. No evidence of mitral stenosis.  4. The aortic valve is normal in structure. Aortic valve regurgitation is not visualized. No aortic stenosis is present.  5. The inferior vena cava is normal in size with greater than 50% respiratory variability, suggesting right atrial pressure of 3 mmHg. FINDINGS  Left Ventricle: Left ventricular ejection fraction, by estimation, is 50 to 55%. The left ventricle has low normal function. The left ventricle demonstrates global hypokinesis. The left ventricular internal cavity size was normal in size. There is no left ventricular hypertrophy. Left ventricular diastolic parameters are consistent with Grade I diastolic dysfunction (impaired relaxation). Right Ventricle: The right ventricular size is normal. No increase in right ventricular wall thickness. Right ventricular systolic function is normal. Left Atrium: Left atrial size was normal in size. Right Atrium: Right atrial size was normal in size. Pericardium: A small pericardial effusion is present. The pericardial effusion is circumferential, posterior and lateral to the left ventricle and posterior to the left ventricle. Mitral Valve: The mitral valve is normal in structure. Normal mobility of the mitral valve leaflets. Mild to moderate  mitral valve regurgitation. No evidence of mitral valve stenosis. Tricuspid Valve: The tricuspid valve is normal in structure. Tricuspid valve regurgitation is mild . No evidence of tricuspid stenosis. Aortic Valve: The aortic  valve is normal in structure. Aortic valve regurgitation is not visualized. No aortic stenosis is present. Pulmonic Valve: The pulmonic valve was normal in structure. Pulmonic valve regurgitation is trivial. No evidence of pulmonic stenosis. Aorta: The aortic root is normal in size and structure. Venous: The inferior vena cava is normal in size with greater than 50% respiratory variability, suggesting right atrial pressure of 3 mmHg. IAS/Shunts: No atrial level shunt detected by color flow Doppler.  LEFT VENTRICLE PLAX 2D LVIDd:         4.77 cm LVIDs:         3.45 cm LV PW:         1.37 cm LV IVS:        1.78 cm LVOT diam:     2.00 cm LVOT Area:     3.14 cm  LEFT ATRIUM         Index LA diam:    5.90 cm 2.94 cm/m                        PULMONIC VALVE AORTA                 PV Vmax:        0.90 m/s Ao Root diam: 2.80 cm PV Peak grad:   3.2 mmHg                       RVOT Peak grad: 4 mmHg   SHUNTS Systemic Diam: 2.00 cm Neoma Laming MD Electronically signed by Neoma Laming MD Signature Date/Time: 10/22/2019/10:39:19 AM    Final     Scheduled Meds: . ascorbic acid  1,000 mg Oral Daily  . aspirin EC  81 mg Oral Daily  . buPROPion  150 mg Oral Daily  . calcium-vitamin D  1 tablet Oral Daily  . Chlorhexidine Gluconate Cloth  6 each Topical Daily  . clonazePAM  1 mg Oral BID  . docusate sodium  100 mg Oral BID  . enoxaparin (LOVENOX) injection  40 mg Subcutaneous Q12H  . feeding supplement (ENSURE ENLIVE)  237 mL Oral Q24H  . furosemide  60 mg Intravenous Q12H  . guaiFENesin  600 mg Oral BID  . ipratropium-albuterol  3 mL Nebulization TID  . ipratropium-albuterol      . lisinopril  10 mg Oral Daily  . loratadine  10 mg Oral Daily  . lurasidone  20 mg Oral BID  . melatonin  5 mg Oral QHS  . methylPREDNISolone (SOLU-MEDROL) injection  60 mg Intravenous Daily  . metoprolol succinate  12.5 mg Oral Daily  . multivitamin with minerals  1 tablet Oral Daily  . nicotine  21 mg Transdermal Daily  .  NIFEdipine  60 mg Oral Daily  . OLANZapine  10 mg Oral BID  . pantoprazole  40 mg Oral Daily  . polyethylene glycol  17 g Oral Daily  . sodium chloride flush  3 mL Intravenous Q12H  . sucralfate  1 g Oral TID WC & HS  . valproic acid  500 mg Oral TID   Continuous Infusions: . sodium chloride       LOS: 4 days   Time spent: 45 minutes.  Vashti Hey, MD [facility]  If 7PM-7AM, please contact  night-coverage [email or web address]  10/22/2019, 3:19 PM   This record has been created using Systems analyst. Errors have been sought and corrected,but may not always be located. Such creation errors do not reflect on the standard of care.

## 2019-10-22 NOTE — Progress Notes (Signed)
Patient placed on Bipap for the night. Patient has been tachypneic and short of breath. Patient gets anxious and agitated at times, pulling off the bipap and oxygen. O2 saturations fall to 70's- low 80's during this time. Attempted to placed patient back on Bipap due to work of breathing and upper airway wheezes. Tolerating at the moment. Will continue to monitor.

## 2019-10-22 NOTE — Progress Notes (Signed)
SUBJECTIVE: Patient still complaining of shortness of breath this morning. Patient denies chest pain.   Vitals:   10/22/19 0500 10/22/19 0700 10/22/19 0726 10/22/19 0756  BP:   100/67   Pulse:   64 65  Resp:   18 16  Temp:   97.6 F (36.4 C)   TempSrc:   Oral   SpO2:   91% 92%  Weight: 103 kg 103.2 kg    Height:        Intake/Output Summary (Last 24 hours) at 10/22/2019 1302 Last data filed at 10/22/2019 1017 Gross per 24 hour  Intake 1407 ml  Output 2600 ml  Net -1193 ml    LABS: Basic Metabolic Panel: Recent Labs    10/21/19 0421 10/22/19 0440  NA 123* 126*  K 4.8 5.4*  CL 76* 77*  CO2 38* 39*  GLUCOSE 151* 90  BUN 36* 50*  CREATININE 0.78 0.79  CALCIUM 8.7* 8.7*   Liver Function Tests: No results for input(s): AST, ALT, ALKPHOS, BILITOT, PROT, ALBUMIN in the last 72 hours. No results for input(s): LIPASE, AMYLASE in the last 72 hours. CBC: Recent Labs    10/21/19 0421 10/22/19 0440  WBC 8.9 8.8  NEUTROABS 7.1 6.0  HGB 10.2* 10.3*  HCT 30.1* 32.3*  MCV 86.5 89.5  PLT 210 233   Cardiac Enzymes: No results for input(s): CKTOTAL, CKMB, CKMBINDEX, TROPONINI in the last 72 hours. BNP: Invalid input(s): POCBNP D-Dimer: No results for input(s): DDIMER in the last 72 hours. Hemoglobin A1C: No results for input(s): HGBA1C in the last 72 hours. Fasting Lipid Panel: No results for input(s): CHOL, HDL, LDLCALC, TRIG, CHOLHDL, LDLDIRECT in the last 72 hours. Thyroid Function Tests: No results for input(s): TSH, T4TOTAL, T3FREE, THYROIDAB in the last 72 hours.  Invalid input(s): FREET3 Anemia Panel: No results for input(s): VITAMINB12, FOLATE, FERRITIN, TIBC, IRON, RETICCTPCT in the last 72 hours.   PHYSICAL EXAM General: Well developed, well nourished, in no acute distress HEENT:  Normocephalic and atramatic Neck:  No JVD.  Lungs: expiratory wheezes Heart: HRRR . Normal S1 and S2 without gallops or murmurs.  Abdomen: Bowel sounds are positive, abdomen  soft and non-tender  Msk:  Back normal, normal gait. Normal strength and tone for age. Extremities: No clubbing, cyanosis or edema.   Neuro: Alert and oriented X 3. Psych:  Good affect, responds appropriately  TELEMETRY: NSR. 71/bpm  ASSESSMENT AND PLAN: Patient presenting to the emergency department in respiratory distress requiring BiPAP. Patient'soxygen requirement has decreased back to 4 L nasal cannula and wheezing has improved this morning. Patient compliant with the BiPAP all night.  Patient's edema has greatly improved. Would recommend slowly converting her 60 mg IV Lasix q12H to BID oral dosing. BP well controlled today. Echocardiogram much improved from last month.  Patient's EF 50-55% with global hypokinesis, grade 1 diastolic dysfunction, mild to moderate MR, mild TR, and small pericardial effusion.  We will continue patient's current HFrEF / HFpEF medication regimen.  Will continue to follow patient.  Active Problems:   Acute on chronic combined systolic and diastolic congestive heart failure ([place])   Acute respiratory failure ([place])    Adaline Sill, NP-C 10/22/2019 1:02 PM

## 2019-10-23 DIAGNOSIS — R451 Restlessness and agitation: Secondary | ICD-10-CM

## 2019-10-23 DIAGNOSIS — R454 Irritability and anger: Secondary | ICD-10-CM

## 2019-10-23 DIAGNOSIS — D649 Anemia, unspecified: Secondary | ICD-10-CM

## 2019-10-23 DIAGNOSIS — F250 Schizoaffective disorder, bipolar type: Secondary | ICD-10-CM

## 2019-10-23 DIAGNOSIS — E669 Obesity, unspecified: Secondary | ICD-10-CM

## 2019-10-23 DIAGNOSIS — R4689 Other symptoms and signs involving appearance and behavior: Secondary | ICD-10-CM

## 2019-10-23 DIAGNOSIS — I10 Essential (primary) hypertension: Secondary | ICD-10-CM

## 2019-10-23 DIAGNOSIS — E119 Type 2 diabetes mellitus without complications: Secondary | ICD-10-CM

## 2019-10-23 DIAGNOSIS — F329 Major depressive disorder, single episode, unspecified: Secondary | ICD-10-CM

## 2019-10-23 DIAGNOSIS — K219 Gastro-esophageal reflux disease without esophagitis: Secondary | ICD-10-CM

## 2019-10-23 DIAGNOSIS — Z6839 Body mass index (BMI) 39.0-39.9, adult: Secondary | ICD-10-CM

## 2019-10-23 DIAGNOSIS — F419 Anxiety disorder, unspecified: Secondary | ICD-10-CM

## 2019-10-23 LAB — BASIC METABOLIC PANEL
Anion gap: 9 (ref 5–15)
BUN: 60 mg/dL — ABNORMAL HIGH (ref 6–20)
CO2: 41 mmol/L — ABNORMAL HIGH (ref 22–32)
Calcium: 8.8 mg/dL — ABNORMAL LOW (ref 8.9–10.3)
Chloride: 77 mmol/L — ABNORMAL LOW (ref 98–111)
Creatinine, Ser: 1.03 mg/dL — ABNORMAL HIGH (ref 0.44–1.00)
GFR calc Af Amer: >60 mL/min (ref >60)
GFR calc non Af Amer: 60 mL/min — ABNORMAL LOW (ref >60)
Glucose, Bld: 113 mg/dL — ABNORMAL HIGH (ref 70–99)
Potassium: 5 mmol/L (ref 3.5–5.1)
Sodium: 127 mmol/L — ABNORMAL LOW (ref 135–145)

## 2019-10-23 LAB — CBC WITH DIFFERENTIAL/PLATELET
Abs Immature Granulocytes: 0.08 10*3/uL — ABNORMAL HIGH (ref 0.00–0.07)
Basophils Absolute: 0 10*3/uL (ref 0.0–0.1)
Basophils Relative: 0 %
Eosinophils Absolute: 0 10*3/uL (ref 0.0–0.5)
Eosinophils Relative: 0 %
HCT: 31.2 % — ABNORMAL LOW (ref 36.0–46.0)
Hemoglobin: 10.4 g/dL — ABNORMAL LOW (ref 12.0–15.0)
Immature Granulocytes: 1 %
Lymphocytes Relative: 21 %
Lymphs Abs: 2.1 10*3/uL (ref 0.7–4.0)
MCH: 29.3 pg (ref 26.0–34.0)
MCHC: 33.3 g/dL (ref 30.0–36.0)
MCV: 87.9 fL (ref 80.0–100.0)
Monocytes Absolute: 1 10*3/uL (ref 0.1–1.0)
Monocytes Relative: 10 %
Neutro Abs: 6.7 10*3/uL (ref 1.7–7.7)
Neutrophils Relative %: 68 %
Platelets: 253 10*3/uL (ref 150–400)
RBC: 3.55 MIL/uL — ABNORMAL LOW (ref 3.87–5.11)
RDW: 13.5 % (ref 11.5–15.5)
WBC: 9.9 10*3/uL (ref 4.0–10.5)
nRBC: 0 % (ref 0.0–0.2)

## 2019-10-23 MED ORDER — CHLORTHALIDONE 25 MG PO TABS
50.0000 mg | ORAL_TABLET | Freq: Every day | ORAL | Status: DC
  Administered 2019-10-24 – 2019-10-26 (×3): 50 mg via ORAL
  Filled 2019-10-23 (×4): qty 2

## 2019-10-23 MED ORDER — HYDRALAZINE HCL 20 MG/ML IJ SOLN
10.0000 mg | Freq: Three times a day (TID) | INTRAMUSCULAR | Status: DC | PRN
  Filled 2019-10-23: qty 0.5

## 2019-10-23 MED ORDER — INSULIN ASPART 100 UNIT/ML ~~LOC~~ SOLN: 0.0000 [IU] | Freq: Every day | SUBCUTANEOUS | Status: DC

## 2019-10-23 MED ORDER — INSULIN ASPART 100 UNIT/ML ~~LOC~~ SOLN
0.0000 [IU] | Freq: Three times a day (TID) | SUBCUTANEOUS | Status: DC
  Administered 2019-10-24 (×2): 1 [IU] via SUBCUTANEOUS
  Administered 2019-10-24 – 2019-10-25 (×2): 2 [IU] via SUBCUTANEOUS
  Filled 2019-10-23 (×4): qty 1

## 2019-10-23 NOTE — Progress Notes (Signed)
SUBJECTIVE: Patient resting comfortably   Vitals:   10/23/19 0646 10/23/19 0741 10/23/19 0829 10/23/19 1006  BP:   (!) 84/60 (!) 186/58  Pulse: 77 72 60 76  Resp:  20    Temp:   97.9 F (36.6 C)   TempSrc:   Oral   SpO2: 90% 98% 96% 92%  Weight:      Height:        Intake/Output Summary (Last 24 hours) at 10/23/2019 1224 Last data filed at 10/23/2019 1019 Gross per 24 hour  Intake 1080 ml  Output 2450 ml  Net -1370 ml    LABS: Basic Metabolic Panel: Recent Labs    10/22/19 0440 10/23/19 0537  NA 126* 127*  K 5.4* 5.0  CL 77* 77*  CO2 39* 41*  GLUCOSE 90 113*  BUN 50* 60*  CREATININE 0.79 1.03*  CALCIUM 8.7* 8.8*   Liver Function Tests: No results for input(s): AST, ALT, ALKPHOS, BILITOT, PROT, ALBUMIN in the last 72 hours. No results for input(s): LIPASE, AMYLASE in the last 72 hours. CBC: Recent Labs    10/22/19 0440 10/23/19 0537  WBC 8.8 9.9  NEUTROABS 6.0 6.7  HGB 10.3* 10.4*  HCT 32.3* 31.2*  MCV 89.5 87.9  PLT 233 253   Cardiac Enzymes: No results for input(s): CKTOTAL, CKMB, CKMBINDEX, TROPONINI in the last 72 hours. BNP: Invalid input(s): POCBNP D-Dimer: No results for input(s): DDIMER in the last 72 hours. Hemoglobin A1C: No results for input(s): HGBA1C in the last 72 hours. Fasting Lipid Panel: No results for input(s): CHOL, HDL, LDLCALC, TRIG, CHOLHDL, LDLDIRECT in the last 72 hours. Thyroid Function Tests: No results for input(s): TSH, T4TOTAL, T3FREE, THYROIDAB in the last 72 hours.  Invalid input(s): FREET3 Anemia Panel: No results for input(s): VITAMINB12, FOLATE, FERRITIN, TIBC, IRON, RETICCTPCT in the last 72 hours.   PHYSICAL EXAM General: Well developed, well nourished, in no acute distress HEENT:  Normocephalic and atramatic Neck:  No JVD.  Lungs: expiratory wheezing. Heart: HRRR . Normal S1 and S2 without gallops or murmurs.  Abdomen: Bowel sounds are positive, abdomen soft and non-tender  Msk:  Back normal, normal  gait. Normal strength and tone for age. Extremities: No clubbing, cyanosis or edema.   Neuro: Alert and oriented X 3. Psych:  Good affect, responds appropriately  TELEMETRY: NSR 74/bpm.  ASSESSMENT AND PLAN: Patient presenting to the emergency department in respiratory distress requiring BiPAP. Patient'soxygen requirementhas decreased back to 4 L nasal cannula and wheezing has improved this morning. Patient's edema has greatly improved. Would recommend slowly converting her 60 mg IV Lasix q12H to BID oral dosing. BP well controlled this morning but has increased throughout the day. Will hold on increasing any doses as patient has previously been soft but will add PRN hydralazine for systolic [id number].Echocardiogram much improved from last month. Spironolactone dc'd d/t hyperkalemia yesterday. Please resume when appropriate. We will continue patient's current HFrEF / HFpEF medication regimen.  Will continue to follow patient.  Active Problems:   Acute on chronic combined systolic and diastolic congestive heart failure ([place])   Acute respiratory failure ([place])    Adaline Sill, NP-C 10/23/2019 12:24 PM

## 2019-10-23 NOTE — NC FL2 (Signed)
[place] LEVEL OF CARE SCREENING TOOL     IDENTIFICATION  Patient Name: Natalie Thomas Birthdate: 1961-06-25 Sex: female Admission Date (Current Location): 10/18/2019  [facility] and [place] Number:  Engineering geologist and Address:  [facility], [address] [place], [place] [address]      Provider Number: [id number]  Attending Physician Name and Address:  Karie Kirks, DO  Relative Name and Phone Number:  DSS legal Guardian    Current Level of Care: Hospital Recommended Level of Care: [place] Prior Approval Number:    Date Approved/Denied:   PASRR Number:    Discharge Plan: SNF    Current Diagnoses: Patient Active Problem List   Diagnosis Date Noted  . Acute respiratory failure ([place]) 10/18/2019  . DNR (do not resuscitate) discussion   . Goals of care, counseling/discussion   . Palliative care by specialist   . Acute on chronic respiratory failure ([place]) 10/01/2019  . Acute on chronic diastolic CHF (congestive heart failure) ([place])   . Shortness of breath 09/22/2019  . Acute on chronic respiratory failure with hypercapnia ([place]) 09/13/2019  . Leukocytosis 09/13/2019  . Class 2 obesity with body mass index (BMI) of 39.0 to 39.9 in adult   . Acute on chronic respiratory failure with hypoxemia ([place]) 09/04/2019  . COVID-19 virus infection   . 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 Nausea   . Acute gastritis   . Abdominal pain, chronic, epigastric   . Hyponatremia 07/09/2019  . Ventral hernia 07/09/2019  . Hypertensive crisis 05/01/2019  . Acute abdominal pain 03/15/2019  . Pleural effusion 03/13/2019  . Atelectasis 03/13/2019  . Acute on chronic combined systolic and diastolic congestive heart failure ([place]) 03/13/2019  . Type 2 diabetes mellitus without complication ([place]) [id number]  . Urinary tract bacterial infections 03/09/2019  . Acute cystitis without hematuria   . Constipation   . Acute hypoxemic  respiratory failure ([place]) 12/03/2018  . Irritability 11/09/2018  . Aggressive behavior 11/09/2018  . Agitation   . S/P right hip fracture 11/07/2017  . Closed comminuted intertrochanteric fracture of right femur ([place]) 11/07/2017  . Anxiety and depression   . Dyspnea 10/16/2017  . Anemia 10/16/2017  . COPD exacerbation ([place]) 10/07/2017  . Hyperlipidemia 10/07/2017  . Acute on chronic respiratory failure with hypoxia and hypercapnia ([place]) 10/07/2017  . Incontinence of urine 08/14/2011  . Schizoaffective disorder, bipolar type ([place]) 07/05/2011  . Cough 05/08/2011  . Lumbar disc disease 04/09/2011  . Polycythemia secondary to smoking 04/09/2011  . HIP PAIN, LEFT 12/12/2007  . 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 DM (diabetes mellitus), type 2 ([place]) 04/08/2007  . MENOPAUSE, PREMATURE 04/08/2007  . Chronic hyponatremia 04/08/2007  . Schizoaffective disorder ([place]) 04/08/2007  . Essential hypertension 04/08/2007  . COPD (chronic obstructive pulmonary disease) ([place]) 04/08/2007  . GERD 04/08/2007  . Osteoporosis 04/08/2007    Orientation RESPIRATION BLADDER Height & Weight     Self, Situation  O2(3 liters) External catheter Weight: 103.2 kg Height:  5\' 1"  (154.9 cm)  BEHAVIORAL SYMPTOMS/MOOD NEUROLOGICAL BOWEL NUTRITION STATUS      Continent    AMBULATORY STATUS COMMUNICATION OF NEEDS Skin   Limited Assist Verbally Normal                       Personal Care Assistance Level of Assistance  Bathing, Feeding, Dressing Bathing Assistance: Limited assistance Feeding assistance: Independent Dressing Assistance: Limited assistance     Functional Limitations Info  Sight, Hearing, Speech Sight Info: Adequate Hearing Info: Adequate Speech Info: Adequate    SPECIAL CARE FACTORS FREQUENCY  PT (By licensed PT), OT (By licensed OT)     PT Frequency: 5 times per week OT Frequency: 5 times per week            Contractures Contractures Info: Not present    Additional  Factors Info  Code Status, Allergies Code Status Info: DNR Allergies Info: CLarithromycin, Pennicillin           Current Medications (10/23/2019):  This is the current hospital active medication list Current Facility-Administered Medications  Medication Dose Route Frequency Provider Last Rate Last Admin  . 0.9 %  sodium chloride infusion  250 mL Intravenous PRN Lorella Nimrod, MD      . acetaminophen (TYLENOL) tablet 650 mg  650 mg Oral Q4H PRN Lorella Nimrod, MD   650 mg at 10/21/19 1403  . albuterol (PROVENTIL) (2.5 MG/3ML) 0.083% nebulizer solution 2.5 mg  2.5 mg Nebulization Q4H PRN Lorella Nimrod, MD   2.5 mg at 10/20/19 1754  . alum & mag hydroxide-simeth (MAALOX/MYLANTA) 200-200-20 MG/5ML suspension 30 mL  30 mL Oral Q4H PRN Lorella Nimrod, MD   30 mL at 10/20/19 1852  . ascorbic acid (VITAMIN C) tablet 1,000 mg  1,000 mg Oral Daily Lorella Nimrod, MD   1,000 mg at 10/23/19 1021  . aspirin EC tablet 81 mg  81 mg Oral Daily Lorella Nimrod, MD   81 mg at 10/23/19 1021  . bisacodyl (DULCOLAX) EC tablet 10 mg  10 mg Oral Daily PRN Bonnell Public Tublu, MD      . bisacodyl (DULCOLAX) suppository 10 mg  10 mg Rectal Daily PRN Sharion Settler, NP      . buPROPion (WELLBUTRIN XL) 24 hr tablet 150 mg  150 mg Oral Daily Lorella Nimrod, MD   150 mg at 10/23/19 1022  . calcium-vitamin D (OSCAL WITH D) 500-200 MG-UNIT per tablet 1 tablet  1 tablet Oral Daily Lorella Nimrod, MD   1 tablet at 10/23/19 1021  . Chlorhexidine Gluconate Cloth 2 % PADS 6 each  6 each Topical Daily Lorella Nimrod, MD      . chlorpheniramine-HYDROcodone (TUSSIONEX) 10-8 MG/5ML suspension 5 mL  5 mL Oral Q12H PRN Lorella Nimrod, MD   5 mL at 10/22/19 1111  . clonazePAM (KLONOPIN) tablet 1 mg  1 mg Oral BID Lorella Nimrod, MD   1 mg at 10/23/19 1022  . docusate sodium (COLACE) capsule 100 mg  100 mg Oral BID Lorella Nimrod, MD   100 mg at 10/23/19 1021  . enoxaparin (LOVENOX) injection 40 mg  40 mg Subcutaneous Q12H Lu Duffel, RPH   40 mg at 10/23/19 1022  . feeding supplement (ENSURE ENLIVE) (ENSURE ENLIVE) liquid 237 mL  237 mL Oral Q24H Lorella Nimrod, MD   237 mL at 10/22/19 2224  . furosemide (LASIX) injection 60 mg  60 mg Intravenous Velora Heckler, MD   60 mg at 10/23/19 0641  . guaiFENesin (MUCINEX) 12 hr tablet 600 mg  600 mg Oral BID Lorella Nimrod, MD   600 mg at 10/23/19 1021  . hydrOXYzine (ATARAX/VISTARIL) tablet 25 mg  25 mg Oral Q6H PRN Lorella Nimrod, MD   25 mg at 10/23/19 1023  . ipratropium-albuterol (DUONEB) 0.5-2.5 (3) MG/3ML nebulizer solution 3 mL  3 mL Nebulization TID Vashti Hey, MD   3 mL at 10/23/19 0741  . lisinopril (ZESTRIL) tablet 10 mg  10  mg Oral Daily Lorella Nimrod, MD   10 mg at 10/23/19 1021  . loratadine (CLARITIN) tablet 10 mg  10 mg Oral Daily Lorella Nimrod, MD   10 mg at 10/23/19 1022  . lurasidone (LATUDA) tablet 20 mg  20 mg Oral BID Lorella Nimrod, MD   20 mg at 10/23/19 1022  . magnesium hydroxide (MILK OF MAGNESIA) suspension 30 mL  30 mL Oral Daily PRN Lorella Nimrod, MD   30 mL at 10/21/19 0356  . melatonin tablet 5 mg  5 mg Oral QHS Lorella Nimrod, MD   5 mg at 10/22/19 2225  . methylPREDNISolone sodium succinate (SOLU-MEDROL) 125 mg/2 mL injection 60 mg  60 mg Intravenous Daily Lorella Nimrod, MD   60 mg at 10/23/19 1024  . metoprolol succinate (TOPROL-XL) 24 hr tablet 12.5 mg  12.5 mg Oral Daily Lorella Nimrod, MD   12.5 mg at 10/23/19 1021  . morphine 2 MG/ML injection 2 mg  2 mg Intravenous Q4H PRN Lorella Nimrod, MD   2 mg at 10/23/19 1022  . multivitamin with minerals tablet 1 tablet  1 tablet Oral Daily Lorella Nimrod, MD   1 tablet at 10/23/19 1022  . nicotine (NICODERM CQ - dosed in mg/24 hours) patch 21 mg  21 mg Transdermal Daily Lorella Nimrod, MD   21 mg at 10/23/19 1034  . NIFEdipine (ADALAT CC) 24 hr tablet 60 mg  60 mg Oral Daily Lorella Nimrod, MD   60 mg at 10/23/19 1023  . OLANZapine (ZYPREXA) tablet 10 mg  10 mg Oral BID Lorella Nimrod,  MD   10 mg at 10/23/19 1021  . ondansetron (ZOFRAN) injection 4 mg  4 mg Intravenous Q6H PRN Lorella Nimrod, MD      . ondansetron (ZOFRAN) tablet 8 mg  8 mg Oral Q6H PRN Lorella Nimrod, MD   8 mg at 10/21/19 0626  . pantoprazole (PROTONIX) EC tablet 40 mg  40 mg Oral Daily Lorella Nimrod, MD   40 mg at 10/23/19 1021  . polyethylene glycol (MIRALAX / GLYCOLAX) packet 17 g  17 g Oral Daily Lorella Nimrod, MD   17 g at 10/23/19 1024  . sodium chloride (OCEAN) 0.65 % nasal spray 2 spray  2 spray Each Nare Q2H PRN Lorella Nimrod, MD      . sodium chloride flush (NS) 0.9 % injection 3 mL  3 mL Intravenous Q12H Lorella Nimrod, MD   3 mL at 10/23/19 1025  . sodium chloride flush (NS) 0.9 % injection 3 mL  3 mL Intravenous PRN Lorella Nimrod, MD      . sodium phosphate (FLEET) 7-19 GM/118ML enema 1 enema  1 enema Rectal Daily PRN Bonnell Public Tublu, MD      . sucralfate (CARAFATE) 1 GM/10ML suspension 1 g  1 g Oral TID WC & HS Lorella Nimrod, MD   1 g at 10/23/19 1023  . valproic acid (DEPAKENE) 250 MG capsule 500 mg  500 mg Oral TID Lorella Nimrod, MD   500 mg at 10/23/19 1023     Discharge Medications: Please see discharge summary for a list of discharge medications.  Relevant Imaging Results:  Relevant Lab Results:   Additional Information [id number]  Su Hilt, RN

## 2019-10-23 NOTE — Progress Notes (Addendum)
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  She was in respiratory distress initially requiring BiPAP, weaned off by this morning and back to her baseline of 4 L.  Being treated for acute on chronic heart failure.  [facility] were consulted to admit the patient for further evaluation and treatment. She was admitted to a telemetry bed. Cardiology was consulted. She was given diuresis, nebulizer treatments and steroids.   The patient states that she is feeling a little better. She is back to her baseline oxygen requirements.   Consultants  . Cardiology  Procedures  . None  Antibiotics   Anti-infectives (From admission, onward)   Start     Dose/Rate Route Frequency Ordered Stop   10/18/19 2245  cefTRIAXone (ROCEPHIN) 1 g in sodium chloride 0.9 % 100 mL IVPB  Status:  Discontinued     1 g 200 mL/hr over 30 Minutes Intravenous Every 24 hours 10/18/19 2244 10/19/19 1122    .  Subjective  The patient is resting in bed. No new complaints.  Objective   Vitals:  Vitals:   10/23/19 1409 10/23/19 1714  BP:  (!) 184/54  Pulse: 67 70  Resp: 18 16  Temp:  98.9 F (37.2 C)  SpO2: 92% 94%   Exam:  Constitutional:  . The patient is awake, alert, and oriented x 3. Mild increased work of breathing with conversation. Respiratory:  Marland Kitchen Mild increased work of breathing with conversation. . No wheezes, rales, or rhonchi . No tactile fremitus Cardiovascular:  . Regular rate and rhythm . No murmurs, ectopy, or  gallups. . No lateral PMI. No thrills. Abdomen:  . Abdomen is soft, non-tender, non-distended . No hernias, masses, or organomegaly . Normoactive bowel sounds.  Musculoskeletal:  . No cyanosis, clubbing . +3 pitting edema of lower extremities bilaterally Skin:  . No rashes, lesions, ulcers . palpation of skin: no induration or nodules Neurologic:  . CN 2-12 intact . Sensation all 4 extremities intact Psychiatric:  . Mental status o Mood, affect appropriate o Orientation to person, place, time  . judgment and insight appear intact  I have personally reviewed the following:   Today's Data  Vitals, BMP, CBC  Micro Data  . 1/2 blood cultures positive for coag negative staph. 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 ascorbic acid  1,000 mg Oral Daily  . aspirin EC  81 mg Oral Daily  . buPROPion  150 mg Oral Daily  . calcium-vitamin D  1 tablet Oral Daily  . Chlorhexidine Gluconate Cloth  6 each Topical Daily  . clonazePAM  1 mg Oral BID  . docusate sodium  100 mg Oral BID  . enoxaparin (LOVENOX) injection  40 mg Subcutaneous Q12H  . feeding supplement (ENSURE ENLIVE)  237 mL Oral Q24H  . furosemide  60 mg Intravenous Q12H  . guaiFENesin  600 mg Oral BID  . ipratropium-albuterol  3 mL Nebulization TID  . lisinopril  10 mg Oral Daily  . loratadine  10 mg Oral Daily  . lurasidone  20 mg Oral BID  . melatonin  5 mg Oral QHS  .  methylPREDNISolone (SOLU-MEDROL) injection  60 mg Intravenous Daily  . metoprolol succinate  12.5 mg Oral Daily  . multivitamin with minerals  1 tablet Oral Daily  . nicotine  21 mg Transdermal Daily  . NIFEdipine  60 mg Oral Daily  . OLANZapine  10 mg Oral BID  . pantoprazole  40 mg Oral Daily  . polyethylene glycol  17 g Oral Daily  . sodium chloride flush  3 mL Intravenous Q12H  . sucralfate  1 g Oral TID WC & HS  . valproic acid  500 mg Oral TID   Continuous Infusions: . sodium chloride      Active Problems:   Acute on chronic  combined systolic and diastolic congestive heart failure ([facility])   Acute respiratory failure ([facility])   LOS: 5 days   A & P   Problem  Class 2 Obesity With Body Mass Index (Bmi) of 39.0 to 39.9 in Adult  Acute On Chronic Respiratory Failure With Hypoxemia ([facility])  Hyponatremia  Type 2 Diabetes Mellitus Without Complication ([facility])  Acute Hypoxemic Respiratory Failure ([facility])  Irritability  Aggressive Behavior  Agitation  Anxiety and Depression  Anemia  Copd Exacerbation ([facility])  Acute On Chronic Respiratory Failure With Hypoxia and Hypercapnia ([facility])  Schizoaffective disorder, bipolar type ([facility])  DM (Diabetes Mellitus), Type 2 ([facility])  Chronic Hyponatremia  Schizoaffective Disorder ([facility])  Essential Hypertension  Copd (Chronic Obstructive Pulmonary Disease) ([facility])  GERD    Acute on chronic hypoxic respiratory failure: Etiology COPD with acute on chronic combined heart failure. Likely some element of cor pulmonale as well due to restrictive lung disease.  Acute on chronic combined heart failure: I appreciate cardiology's assistance.  Patient has been diuresing well on Lasix 60 IV twice daily. She is now negative 7.1L.  Will change to Lasix 60 p.o. twice daily per cardiology recommendations. They note echocardiogram with EF to 50 to 55% with global hypokinesis and grade 1 diastolic dysfunction, much improved from last month.  Patient initiated on Toprol XL 12.5 daily which she is tolerating well.   She had previously developed symptomatic bradycardia on a beta-blocker, follow heart rate closely. Continue lisinopril. Florinef was recently discontinued, consideration of midodrine if blood pressure drops per previous notes.  Hyperkalemia: Patient is on spironolactone.   COPD exacerbation: Patient has been tapered down to Solu-Medrol 60 mg daily. Oxygen requirements are at baseline.  Continue bronchodilators. No sign of pulmonary infection.  Hyponatremia: Acute on chronic likely due to  psychogenic polydipsia. 129 earlier this month. Slowly improving with fluid restriction. Monitor. Asymptomatic.  Hypertension: Blood pressures high today. Spironolactone discontinued. Chlorthalidone added to lasix, metoprolol, nifedipine, and lisinopril.   Positive blood culture: 1/4 positive for coag-negative staph. Contaminant. Surveillance cultures have had no growth.   Hypokalemia: Resolved. Monitor.  Schizoaffective bipolar disorder: Continue Zyprexa, Depakene, Latuda, Wellbutrin and Klonopin.  Type 2 diabetes mellitus: Fasting glucose is well controlled. Will check HbA1c and follow glucoses with FSBS and SSI.  Schizoaffective bipolar disorder: Continue Zyprexa, Depakene, Latuda, Wellbutrin and Klonopin.  I have seen and examined this patient myself. I have spent 34 minutes in her evaluation and care.  DVT Prophylaxis: Lovenox CODE STATUS: DNR Family Communication: None available Disposition: Patient is from SNF. Anticipate discharge to SNF. Barriers to discharge include continuing edema and hypoxia. Discharge when cardiology signs off.  Sneha Willig, DO [facility] Direct contact: see [email or web address]  7PM-7AM contact night coverage as above 10/23/2019, 6:23 PM  LOS: 5 days

## 2019-10-24 LAB — GLUCOSE, POCT (MANUAL RESULT ENTRY)
POC Glucose: 144 mg/dl — AB (ref 70–99)
POC Glucose: 124 mg/dL — AB (ref 70–99)
POC Glucose: 177 mg/dL — AB (ref 70–99)

## 2019-10-24 LAB — BASIC METABOLIC PANEL WITH GFR
Anion gap: 12 (ref 5–15)
Anion gap: 14 (ref 5–15)
BUN: 74 mg/dL — ABNORMAL HIGH (ref 6–20)
BUN: 73 mg/dL — ABNORMAL HIGH (ref 6–20)
CO2: 39 mmol/L — ABNORMAL HIGH (ref 22–32)
CO2: 34 mmol/L — ABNORMAL HIGH (ref 22–32)
Calcium: 8.7 mg/dL — ABNORMAL LOW (ref 8.9–10.3)
Calcium: 8.8 mg/dL — ABNORMAL LOW (ref 8.9–10.3)
Chloride: 75 mmol/L — ABNORMAL LOW (ref 98–111)
Chloride: 78 mmol/L — ABNORMAL LOW (ref 98–111)
Creatinine, Ser: 1.1 mg/dL — ABNORMAL HIGH (ref 0.44–1.00)
Creatinine, Ser: 1.2 mg/dL — ABNORMAL HIGH (ref 0.44–1.00)
GFR calc Af Amer: >60 mL/min (ref >60)
GFR calc Af Amer: 58 mL/min — ABNORMAL LOW (ref >60)
GFR calc non Af Amer: 55 mL/min — ABNORMAL LOW (ref >60)
GFR calc non Af Amer: 50 mL/min — ABNORMAL LOW (ref >60)
Glucose, Bld: 127 mg/dL — ABNORMAL HIGH (ref 70–99)
Glucose, Bld: 174 mg/dL — ABNORMAL HIGH (ref 70–99)
Potassium: 5.1 mmol/L (ref 3.5–5.1)
Potassium: 5.7 mmol/L — ABNORMAL HIGH (ref 3.5–5.1)
Sodium: 126 mmol/L — ABNORMAL LOW (ref 135–145)
Sodium: 126 mmol/L — ABNORMAL LOW (ref 135–145)

## 2019-10-24 LAB — CULTURE, BLOOD (ROUTINE X 2): Culture: NO GROWTH

## 2019-10-24 LAB — CBC WITH DIFFERENTIAL/PLATELET
Abs Immature Granulocytes: 0.1 10*3/uL — ABNORMAL HIGH (ref 0.00–0.07)
Basophils Absolute: 0 10*3/uL (ref 0.0–0.1)
Basophils Relative: 0 %
Eosinophils Absolute: 0 10*3/uL (ref 0.0–0.5)
Eosinophils Relative: 0 %
HCT: 30.6 % — ABNORMAL LOW (ref 36.0–46.0)
Hemoglobin: 10 g/dL — ABNORMAL LOW (ref 12.0–15.0)
Immature Granulocytes: 1 %
Lymphocytes Relative: 20 %
Lymphs Abs: 2.2 10*3/uL (ref 0.7–4.0)
MCH: 29 pg (ref 26.0–34.0)
MCHC: 32.7 g/dL (ref 30.0–36.0)
MCV: 88.7 fL (ref 80.0–100.0)
Monocytes Absolute: 0.9 10*3/uL (ref 0.1–1.0)
Monocytes Relative: 8 %
Neutro Abs: 7.9 10*3/uL — ABNORMAL HIGH (ref 1.7–7.7)
Neutrophils Relative %: 71 %
Platelets: 255 10*3/uL (ref 150–400)
RBC: 3.45 MIL/uL — ABNORMAL LOW (ref 3.87–5.11)
RDW: 13.7 % (ref 11.5–15.5)
WBC: 11.2 10*3/uL — ABNORMAL HIGH (ref 4.0–10.5)
nRBC: 0 % (ref 0.0–0.2)

## 2019-10-24 LAB — HEMOGLOBIN A1C
Hgb A1c MFr Bld: 5.6 % (ref 4.8–5.6)
Mean Plasma Glucose: 114.02 mg/dL

## 2019-10-24 MED ORDER — SODIUM CHLORIDE 0.9 % IV SOLN: INTRAVENOUS | Status: DC

## 2019-10-24 MED ORDER — FUROSEMIDE 40 MG PO TABS
60.0000 mg | ORAL_TABLET | Freq: Two times a day (BID) | ORAL | Status: DC
  Administered 2019-10-24 – 2019-10-26 (×4): 60 mg via ORAL
  Filled 2019-10-24 (×4): qty 1

## 2019-10-24 MED ORDER — VALPROIC ACID 250 MG/5ML PO SOLN
500.0000 mg | Freq: Three times a day (TID) | ORAL | Status: DC
  Administered 2019-10-25 – 2019-10-26 (×4): 500 mg via ORAL
  Filled 2019-10-24 (×9): qty 10

## 2019-10-24 NOTE — Progress Notes (Signed)
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  She was in respiratory distress initially requiring BiPAP, weaned off by this morning and back to her baseline of 4 L.  Being treated for acute on chronic heart failure.  [facility] were consulted to admit the patient for further evaluation and treatment. She was admitted to a telemetry bed. Cardiology was consulted. She was given diuresis, nebulizer treatments and steroids.   The patient states that she is feeling a little better. She is back to her baseline oxygen requirements.   Consultants  . Cardiology  Procedures  . None  Antibiotics   Anti-infectives (From admission, onward)   Start     Dose/Rate Route Frequency Ordered Stop   10/18/19 2245  cefTRIAXone (ROCEPHIN) 1 g in sodium chloride 0.9 % 100 mL IVPB  Status:  Discontinued     1 g 200 mL/hr over 30 Minutes Intravenous Every 24 hours 10/18/19 2244 10/19/19 1122     Subjective  The patient is resting in bed. No new complaints.  Objective   Vitals:  Vitals:   10/23/19 2209 10/24/19 0742  BP: (!) 104/57 107/64  Pulse: 65 (!) 57  Resp: 16   Temp: 98.4 F (36.9 C) 97.8 F (36.6 C)  SpO2: 97% 98%   Exam:  Constitutional:  The patient is awake, alert, and oriented x 3. No acute distress. Respiratory:  Marland Kitchen Mild increased work of breathing with conversation. . No wheezes, rales, or rhonchi . No tactile fremitus Cardiovascular:  . Regular rate and rhythm . No murmurs, ectopy, or gallups. . No lateral  PMI. No thrills. Abdomen:  . Abdomen is soft, non-tender, non-distended . No hernias, masses, or organomegaly . Normoactive bowel sounds.  Musculoskeletal:  . No cyanosis, clubbing . +3 pitting edema of lower extremities bilaterally Skin:  . No rashes, lesions, ulcers . palpation of skin: no induration or nodules Neurologic:  . CN 2-12 intact . Sensation all 4 extremities intact Psychiatric:  . Mental status o Mood, affect appropriate o Orientation to person, place, time  . judgment and insight appear intact  I have personally reviewed the following:   Today's Data  Vitals, BMP, CBC  Micro Data  . 1/2 blood cultures positive for coag negative staph. 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 ascorbic acid  1,000 mg Oral Daily  . aspirin EC  81 mg Oral Daily  . buPROPion  150 mg Oral Daily  . calcium-vitamin D  1 tablet Oral Daily  . Chlorhexidine Gluconate Cloth  6 each Topical Daily  . chlorthalidone  50 mg Oral Daily  . clonazePAM  1 mg Oral BID  . docusate sodium  100 mg Oral BID  . enoxaparin (LOVENOX) injection  40 mg Subcutaneous Q12H  . feeding supplement (ENSURE ENLIVE)  237 mL Oral Q24H  . furosemide  60 mg Intravenous Q12H  . guaiFENesin  600 mg Oral BID  . insulin aspart  0-5 Units Subcutaneous QHS  . insulin aspart  0-9 Units Subcutaneous TID WC  . ipratropium-albuterol  3 mL Nebulization TID  . lisinopril  10 mg Oral Daily  .  loratadine  10 mg Oral Daily  . lurasidone  20 mg Oral BID  . melatonin  5 mg Oral QHS  . methylPREDNISolone (SOLU-MEDROL) injection  60 mg Intravenous Daily  . metoprolol succinate  12.5 mg Oral Daily  . multivitamin with minerals  1 tablet Oral Daily  . nicotine  21 mg Transdermal Daily  . NIFEdipine  60 mg Oral Daily  . OLANZapine  10 mg Oral BID  . pantoprazole  40 mg Oral Daily  . polyethylene glycol  17 g Oral Daily  . sodium chloride flush  3 mL Intravenous Q12H  . sucralfate  1 g Oral TID WC & HS  . valproic acid   500 mg Oral TID   Continuous Infusions: . sodium chloride    . sodium chloride 75 mL/hr at 10/24/19 1232    Active Problems:   Chronic hyponatremia   Essential hypertension   GERD   Schizoaffective disorder, bipolar type (HCC)   COPD exacerbation (HCC)   Acute on chronic respiratory failure with hypoxia and hypercapnia (HCC)   Anemia   Anxiety and depression   Irritability   Aggressive behavior   Agitation   Type 2 diabetes mellitus without complication (HCC)   Acute on chronic combined systolic and diastolic congestive heart failure (HCC)   Hyponatremia   Acute on chronic respiratory failure with hypoxemia (HCC)   Class 2 obesity with body mass index (BMI) of 39.0 to 39.9 in adult   Acute respiratory failure ([place])   LOS: 6 days   A & P   No problems updated.  Acute on chronic hypoxic respiratory failure: Etiology COPD with acute on chronic combined heart failure. Likely some element of cor pulmonale as well due to restrictive lung disease. Sh eis currently saturating 98% on 3 L by nasal cannula which is her baseline.  Acute on chronic combined heart failure: I appreciate cardiology's assistance.  Patient has been diuresing well on Lasix 60 IV twice daily. She is now negative 7.1L.  Will change to Lasix 60 p.o. twice daily per cardiology recommendations. They note echocardiogram with EF to 50 to 55% with global hypokinesis and grade 1 diastolic dysfunction, much improved from last month. She is currently 9.23 L negative in terms of fluid balance. Will convert to oral lasix.   Patient initiated on Toprol XL 12.5 daily which she is tolerating well.   She had previously developed symptomatic bradycardia on a beta-blocker, follow heart rate closely. Continue lisinopril. Florinef was recently discontinued, consideration of midodrine if blood pressure drops per previous notes.  Hyperkalemia: Patient is on spironolactone.   COPD exacerbation: Patient has been tapered down to  Solu-Medrol 60 mg daily. Oxygen requirements are at baseline.  Continue bronchodilators. No sign of pulmonary infection.  Hyponatremia: Acute on chronic likely due to psychogenic polydipsia. 129 earlier this month. 126 today. Drop is likely due to over diuresis. Will convert to oral lasix.  Monitor.  Hypertension: Blood pressures high today. Spironolactone discontinued. Chlorthalidone added to lasix, metoprolol, nifedipine, and lisinopril.   Positive blood culture: 1/4 positive for coag-negative staph. Contaminant. Surveillance cultures have had no growth.   Hypokalemia: Resolved. Monitor.  Schizoaffective bipolar disorder: Continue Zyprexa, Depakene, Latuda, Wellbutrin and Klonopin.  Type 2 diabetes mellitus: Fasting glucose is well controlled. Will check HbA1c and follow glucoses with FSBS and SSI.  I have seen and examined this patient myself. I have spent 32 minutes in her evaluation and care.  DVT Prophylaxis: Lovenox CODE STATUS: DNR Family Communication: None  available Disposition: Patient is from SNF. Anticipate discharge to SNF. Barriers to discharge include Na of 126.   Zarek Relph, DO [facility] Direct contact: see [email or web address]  7PM-7AM contact night coverage as above 10/24/2019, 12:51 PM  LOS: 5 days

## 2019-10-24 NOTE — Progress Notes (Signed)
SUBJECTIVE: Patient is feeling much better she states that she is as well as she can be.   Vitals:   10/23/19 1947 10/23/19 2209 10/24/19 0500 10/24/19 0742  BP:  (!) 104/57  107/64  Pulse:  65  (!) 57  Resp:  16    Temp:  98.4 F (36.9 C)  97.8 F (36.6 C)  TempSrc:  Oral  Oral  SpO2: 94% 97%  98%  Weight:   103.5 kg   Height:        Intake/Output Summary (Last 24 hours) at 10/24/2019 1443 Last data filed at 10/24/2019 1232 Gross per 24 hour  Intake 569.74 ml  Output 2700 ml  Net -2130.26 ml    LABS: Basic Metabolic Panel: Recent Labs    10/23/19 0537 10/24/19 0506  NA 127* 126*  K 5.0 5.1  CL 77* 75*  CO2 41* 39*  GLUCOSE 113* 127*  BUN 60* 74*  CREATININE 1.03* 1.10*  CALCIUM 8.8* 8.7*   Liver Function Tests: No results for input(s): AST, ALT, ALKPHOS, BILITOT, PROT, ALBUMIN in the last 72 hours. No results for input(s): LIPASE, AMYLASE in the last 72 hours. CBC: Recent Labs    10/23/19 0537 10/24/19 0506  WBC 9.9 11.2*  NEUTROABS 6.7 7.9*  HGB 10.4* 10.0*  HCT 31.2* 30.6*  MCV 87.9 88.7  PLT 253 255   Cardiac Enzymes: No results for input(s): CKTOTAL, CKMB, CKMBINDEX, TROPONINI in the last 72 hours. BNP: Invalid input(s): POCBNP D-Dimer: No results for input(s): DDIMER in the last 72 hours. Hemoglobin A1C: Recent Labs    10/23/19 0537  HGBA1C 5.6   Fasting Lipid Panel: No results for input(s): CHOL, HDL, LDLCALC, TRIG, CHOLHDL, LDLDIRECT in the last 72 hours. Thyroid Function Tests: No results for input(s): TSH, T4TOTAL, T3FREE, THYROIDAB in the last 72 hours.  Invalid input(s): FREET3 Anemia Panel: No results for input(s): VITAMINB12, FOLATE, FERRITIN, TIBC, IRON, RETICCTPCT in the last 72 hours.   PHYSICAL EXAM General: Well developed, well nourished, in no acute distress HEENT:  Normocephalic and atramatic Neck:  No JVD.  Lungs: Clear bilaterally to auscultation and percussion. Heart: HRRR . Normal S1 and S2 without gallops or  murmurs.  Abdomen: Bowel sounds are positive, abdomen soft and non-tender  Msk:  Back normal, normal gait. Normal strength and tone for age. Extremities: No clubbing, cyanosis or edema.   Neuro: Alert and oriented X 3. Psych:  Good affect, responds appropriately  TELEMETRY: Sinus rhythm 65 bpm  ASSESSMENT AND PLAN: Congestive heart failure with left ventricular ejection fraction with left ventricular ejection fraction 50 to 55%.  On metoprolol ACE inhibitor's and Lasix 60 twice daily.  Clinically improving. Active Problems:   Chronic hyponatremia   Essential hypertension   GERD   Schizoaffective disorder, bipolar type (HCC)   COPD exacerbation (HCC)   Acute on chronic respiratory failure with hypoxia and hypercapnia (HCC)   Anemia   Anxiety and depression   Irritability   Aggressive behavior   Agitation   Type 2 diabetes mellitus without complication (HCC)   Acute on chronic combined systolic and diastolic congestive heart failure (HCC)   Hyponatremia   Acute on chronic respiratory failure with hypoxemia (HCC)   Class 2 obesity with body mass index (BMI) of 39.0 to 39.9 in adult   Acute respiratory failure (HCC)    Karem Farha A, MD, [facility] 10/24/2019 2:43 PM

## 2019-10-24 NOTE — Progress Notes (Signed)
PT Cancellation Note  Patient Details Name: Natalie Thomas MRN: [id number] DOB: 09-09-1960   Cancelled Treatment:    Reason Eval/Treat Not Completed: Patient declined, no reason specified. Patient is lethargic and having difficulty keeping her eyes open.    [address] [place] DPT 10/24/2019, 1:55 PM

## 2019-10-25 LAB — GLUCOSE, POCT (MANUAL RESULT ENTRY)
POC Glucose: 71 mg/dl (ref 70–99)
POC Glucose: 117 mg/dl — AB (ref 70–99)
POC Glucose: 170 mg/dl — AB (ref 70–99)

## 2019-10-25 LAB — CBC WITH DIFFERENTIAL/PLATELET
Abs Immature Granulocytes: 0.09 10*3/uL — ABNORMAL HIGH (ref 0.00–0.07)
Basophils Absolute: 0 10*3/uL (ref 0.0–0.1)
Basophils Relative: 0 %
Eosinophils Absolute: 0 10*3/uL (ref 0.0–0.5)
Eosinophils Relative: 0 %
HCT: 32.7 % — ABNORMAL LOW (ref 36.0–46.0)
Hemoglobin: 10.7 g/dL — ABNORMAL LOW (ref 12.0–15.0)
Immature Granulocytes: 1 %
Lymphocytes Relative: 20 %
Lymphs Abs: 2.2 10*3/uL (ref 0.7–4.0)
MCH: 28.9 pg (ref 26.0–34.0)
MCHC: 32.7 g/dL (ref 30.0–36.0)
MCV: 88.4 fL (ref 80.0–100.0)
Monocytes Absolute: 0.9 10*3/uL (ref 0.1–1.0)
Monocytes Relative: 8 %
Neutro Abs: 7.7 10*3/uL (ref 1.7–7.7)
Neutrophils Relative %: 71 %
Platelets: 264 10*3/uL (ref 150–400)
RBC: 3.7 MIL/uL — ABNORMAL LOW (ref 3.87–5.11)
RDW: 13.5 % (ref 11.5–15.5)
WBC: 10.9 10*3/uL — ABNORMAL HIGH (ref 4.0–10.5)
nRBC: 0 % (ref 0.0–0.2)

## 2019-10-25 LAB — BASIC METABOLIC PANEL
Anion gap: 9 (ref 5–15)
BUN: 71 mg/dL — ABNORMAL HIGH (ref 6–20)
CO2: 40 mmol/L — ABNORMAL HIGH (ref 22–32)
Calcium: 8.6 mg/dL — ABNORMAL LOW (ref 8.9–10.3)
Chloride: 81 mmol/L — ABNORMAL LOW (ref 98–111)
Creatinine, Ser: 0.95 mg/dL (ref 0.44–1.00)
GFR calc Af Amer: >60 mL/min (ref >60)
GFR calc non Af Amer: >60 mL/min (ref >60)
Glucose, Bld: 120 mg/dL — ABNORMAL HIGH (ref 70–99)
Potassium: 4.9 mmol/L (ref 3.5–5.1)
Sodium: 130 mmol/L — ABNORMAL LOW (ref 135–145)

## 2019-10-25 MED ORDER — METOPROLOL SUCCINATE ER 25 MG PO TB24: 12.5000 mg | ORAL_TABLET | Freq: Every day | ORAL | 0 refills | Status: AC

## 2019-10-25 MED ORDER — PREDNISONE 20 MG PO TABS: ORAL_TABLET | ORAL | 0 refills | Status: AC

## 2019-10-25 MED ORDER — NIFEDIPINE ER 60 MG PO TB24: 60.0000 mg | ORAL_TABLET | Freq: Every day | ORAL | 0 refills | Status: AC

## 2019-10-25 MED ORDER — CLONAZEPAM 0.5 MG PO TABS
0.5000 mg | ORAL_TABLET | Freq: Two times a day (BID) | ORAL | Status: DC
  Administered 2019-10-25 – 2019-10-26 (×3): 0.5 mg via ORAL
  Filled 2019-10-25 (×3): qty 1

## 2019-10-25 MED ORDER — PREDNISONE 50 MG PO TABS
60.0000 mg | ORAL_TABLET | Freq: Every day | ORAL | Status: AC
  Administered 2019-10-25 – 2019-10-26 (×2): 60 mg via ORAL
  Filled 2019-10-25 (×2): qty 1

## 2019-10-25 MED ORDER — LISINOPRIL 10 MG PO TABS: 10.0000 mg | ORAL_TABLET | Freq: Every day | ORAL | 0 refills | Status: DC

## 2019-10-25 MED ORDER — MAGNESIUM HYDROXIDE 400 MG/5ML PO SUSP: 30.0000 mL | Freq: Every day | ORAL | 0 refills | Status: AC | PRN

## 2019-10-25 MED ORDER — FUROSEMIDE 20 MG PO TABS: 60.0000 mg | ORAL_TABLET | Freq: Two times a day (BID) | ORAL | 0 refills | Status: DC

## 2019-10-25 MED ORDER — CHLORTHALIDONE 50 MG PO TABS: 50.0000 mg | ORAL_TABLET | Freq: Every day | ORAL | 0 refills | Status: DC

## 2019-10-25 MED ORDER — CLONAZEPAM 1 MG PO TABS: 0.5000 mg | ORAL_TABLET | Freq: Two times a day (BID) | ORAL | 0 refills | Status: DC

## 2019-10-25 MED ORDER — ASPIRIN 81 MG PO TBEC: 81.0000 mg | DELAYED_RELEASE_TABLET | Freq: Every day | ORAL | 0 refills | Status: DC

## 2019-10-25 MED ORDER — PREDNISONE 50 MG PO TABS: 60.0000 mg | ORAL_TABLET | Freq: Every day | ORAL | Status: DC

## 2019-10-25 MED ORDER — IPRATROPIUM-ALBUTEROL 0.5-2.5 (3) MG/3ML IN SOLN: 3.0000 mL | Freq: Three times a day (TID) | RESPIRATORY_TRACT | 0 refills | Status: AC

## 2019-10-25 NOTE — Progress Notes (Signed)
OT Cancellation Note  Patient Details Name: Natalie Thomas MRN: [id number] DOB: 1960/07/10   Cancelled Treatment:    Reason Eval/Treat Not Completed: Fatigue/lethargy limiting ability to participate. 1st attempt to room pt stated she would get back to the chair after lunch, requesting OT return in 1 hour. OT agreeable per PT note on pt fluctuating desire to engage in mobility. Upon arrival for 2nd attempt pt found in bed asleep, not easily woken. Pt grunted when asked to initiate OT services and did not open eyes. OT will re-attempt as available.   Dessie Coma, M.S. OTR/L  10/25/19, 1:04 PM

## 2019-10-25 NOTE — Evaluation (Signed)
Physical Therapy Evaluation Patient Details Name: Natalie Thomas MRN: [id number] DOB: 09/05/1960 Today's Date: 10/25/2019   History of Present Illness  Pt is a 59 y.o. female presenting to hospital 4/25 with SOB and respiratory distress.  Pt admitted with acute on chronic systolic and diastolic CHF with subsequent acute respiratory failure requiring Bipap, hypertensive urgency, COPD exacerbation, hyponatremia, and hypokalemia.  PMH includes anemia, anxiety, COPD, Covid-19, R hip fx 2019, s/p R intertrochanteric IMN 10/2017, and h/o pelvic fx surgery.  Clinical Impression  Prior to hospital admission, pt reports being ambulatory with RW (working on using rollator); is a long term care resident of [facility].  Pt resting in bed upon PT arrival (had already finished eating her breakfast) and agreeable to therapy with some encouragement.  As therapist was setting up pt's room, pt then stated she did not want to get OOB right now but then stating multiple things she needed in order to get up (and would get up with these things in place) so therapist retrieved items and set-up pt's room per pt request.  Before removing purewick, therapist verified with pt that she was willing to get OOB and participate and pt agreeable.  After Purewick removed, pt then refusing physical therapy and appearing upset that therapist was trying to encourage her to get OOB.  Therapist apologized and said she would leave so pt could rest (after making sure pt was set-up) but then pt appearing upset that therapist was going to leave without getting her up and walking; then pt suddenly got up to sit on edge of bed on her own and requesting to walk.  Currently pt is modified independent semi-supine to sitting edge of bed; CGA with transfers; and CGA with ambulating 80 feet with RW.  Pt on 3 L O2 via nasal cannula throughout therapy session:  Pt's O2 decreased to 86% post ambulation but at rest otherwise >93% (beginning/end of  session).  Pt apologetic to therapist end of session for giving therapist a hard time and stated that she just did not feel well and wanted to rest (pt requesting to stay in recliner to rest).  Pt would benefit from skilled PT to address noted impairments and functional limitations (see below for any additional details).  Upon hospital discharge, pt would benefit from discharging back to previous facility with HHPT.    Follow Up Recommendations Home health PT;Supervision for mobility/OOB    Equipment Recommendations  (pt already has RW)    Recommendations for Other Services       Precautions / Restrictions Precautions Precautions: Fall Precaution Comments: Strict I/O Restrictions Weight Bearing Restrictions: No      Mobility  Bed Mobility   Bed Mobility: Supine to Sit     Supine to sit: Supervision;HOB elevated(SBA for lines)     General bed mobility comments: no difficulties noted  Transfers Overall transfer level: Needs assistance Equipment used: Rolling walker (2 wheeled) Transfers: Sit to/from [facility] Sit to Stand: Min guard Stand pivot transfers: Min guard       General transfer comment: x1 trial standing from bed and x1 trial standing from recliner up to RW; stand step turn bed to recliner with RW CGA  Ambulation/Gait Ambulation/Gait assistance: Min guard Gait Distance (Feet): 80 Feet Assistive device: Rolling walker (2 wheeled)   Gait velocity: mildly decreased   General Gait Details: partial step through gait pattern; steady with RW use  Stairs  Wheelchair Mobility    Modified Rankin (Stroke Patients Only)       Balance Overall balance assessment: Needs assistance Sitting-balance support: No upper extremity supported;Feet supported Sitting balance-Leahy Scale: Good Sitting balance - Comments: steady sitting reaching within BOS   Standing balance support: No upper extremity supported Standing balance-Leahy  Scale: Good Standing balance comment: steady standing reaching within BOS                             Pertinent Vitals/Pain Pain Assessment: Faces Faces Pain Scale: No hurt Pain Intervention(s): Limited activity within patient's tolerance;Monitored during session;Repositioned  HR WFL during sessions activities.    Home Living Family/patient expects to be discharged to:: Skilled nursing facility                 Additional Comments: Long term care resident of [facility]    Prior Function     Gait / Transfers Assistance Needed: Pt reports ambulating with RW (short and and longer distances) but progressing to rollator recently  ADL's / Homemaking Assistance Needed: Pt reports needing assist with her care        Hand Dominance        Extremity/Trunk Assessment   Upper Extremity Assessment Upper Extremity Assessment: Generalized weakness    Lower Extremity Assessment Lower Extremity Assessment: Generalized weakness    Cervical / Trunk Assessment Cervical / Trunk Assessment: Normal  Communication   Communication: No difficulties  Cognition Arousal/Alertness: Awake/alert Behavior During Therapy: Anxious;Impulsive Overall Cognitive Status: No family/caregiver present to determine baseline cognitive functioning                                 General Comments: Oriented to at least person and place      General Comments   Nursing cleared pt for participation in physical therapy.    Exercises   Transfers and ambulation   Assessment/Plan    PT Assessment Patient needs continued PT services  PT Problem List Decreased strength;Decreased activity tolerance;Decreased balance;Decreased mobility;Cardiopulmonary status limiting activity       PT Treatment Interventions DME instruction;Gait training;Functional mobility training;Therapeutic activities;Therapeutic exercise;Balance training;Patient/family education    PT Goals  (Current goals can be found in the Care Plan section)  Acute Rehab PT Goals Patient Stated Goal: to be able to walk PT Goal Formulation: With patient Time For Goal Achievement: 11/08/19 Potential to Achieve Goals: Good    Frequency Min 2X/week   Barriers to discharge        Co-evaluation               AM-PAC PT "6 Clicks" Mobility  Outcome Measure Help needed turning from your back to your side while in a flat bed without using bedrails?: None Help needed moving from lying on your back to sitting on the side of a flat bed without using bedrails?: None Help needed moving to and from a bed to a chair (including a wheelchair)?: A Little Help needed standing up from a chair using your arms (e.g., wheelchair or bedside chair)?: A Little Help needed to walk in hospital room?: A Little Help needed climbing 3-5 steps with a railing? : A Lot 6 Click Score: 19    End of Session Equipment Utilized During Treatment: Gait belt;Oxygen(3 L O2 via nasal cannula) Activity Tolerance: Patient tolerated treatment well Patient left: in chair;with call bell/phone within reach;with chair  alarm set Nurse Communication: Mobility status;Precautions(pt requesting Diet Shasta Cola) PT Visit Diagnosis: Other abnormalities of gait and mobility (R26.89);Muscle weakness (generalized) (M62.81)    Time: [id number] PT Time Calculation (min) (ACUTE ONLY): 46 min   Charges:   PT Evaluation $PT Eval Low Complexity: 1 Low PT Treatments $Gait Training: 8-22 mins $Therapeutic Activity: 8-22 mins       Leitha Bleak, PT 10/25/19, 10:06 AM

## 2019-10-25 NOTE — Progress Notes (Signed)
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  She was in respiratory distress initially requiring BiPAP, weaned off by this morning and back to her baseline of 4 L.  Being treated for acute on chronic heart failure.  [facility] were consulted to admit the patient for further evaluation and treatment. She was admitted to a telemetry bed. Cardiology was consulted. She was given diuresis, nebulizer treatments and steroids.   The patient states that she is feeling a little better. She is back to her baseline oxygen requirements.   Consultants  . Cardiology  Procedures  . None  Antibiotics   Anti-infectives (From admission, onward)   Start     Dose/Rate Route Frequency Ordered Stop   10/18/19 2245  cefTRIAXone (ROCEPHIN) 1 g in sodium chloride 0.9 % 100 mL IVPB  Status:  Discontinued     1 g 200 mL/hr over 30 Minutes Intravenous Every 24 hours 10/18/19 2244 10/19/19 1122     Subjective  The patient is resting comfortably. No new complaints.  Objective   Vitals:  Vitals:   10/25/19 1028 10/25/19 1115  BP: (!) 204/55 115/77  Pulse: 61 62  Resp:    Temp:    SpO2:  95%   Exam:  Constitutional:  The patient is awake, alert, and oriented x 3. No acute distress. Respiratory:  Marland Kitchen Mild increased work of breathing with conversation. . No wheezes, rales, or rhonchi . No tactile fremitus Cardiovascular:  . Regular rate and rhythm . No murmurs, ectopy, or gallups. . No lateral PMI. No thrills. Abdomen:  . Abdomen  is soft, non-tender, non-distended . No hernias, masses, or organomegaly . Normoactive bowel sounds.  Musculoskeletal:  . No cyanosis, clubbing . +3 pitting edema of lower extremities bilaterally Skin:  . No rashes, lesions, ulcers . palpation of skin: no induration or nodules Neurologic:  . CN 2-12 intact . Sensation all 4 extremities intact Psychiatric:  . Mental status o Mood, affect appropriate o Orientation to person, place, time  . judgment and insight appear intact  I have personally reviewed the following:   Today's Data  Vitals, BMP, CBC  Micro Data  . 1/2 blood cultures positive for coag negative staph. 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 ascorbic acid  1,000 mg Oral Daily  . aspirin EC  81 mg Oral Daily  . buPROPion  150 mg Oral Daily  . calcium-vitamin D  1 tablet Oral Daily  . Chlorhexidine Gluconate Cloth  6 each Topical Daily  . chlorthalidone  50 mg Oral Daily  . clonazePAM  0.5 mg Oral BID  . docusate sodium  100 mg Oral BID  . enoxaparin (LOVENOX) injection  40 mg Subcutaneous Q12H  . feeding supplement (ENSURE ENLIVE)  237 mL Oral Q24H  . furosemide  60 mg Oral BID  . guaiFENesin  600 mg Oral BID  . insulin aspart  0-5 Units Subcutaneous QHS  . insulin aspart  0-9 Units Subcutaneous TID WC  . ipratropium-albuterol  3 mL Nebulization TID  . lisinopril  10 mg Oral Daily  . loratadine  10 mg Oral Daily  .  lurasidone  20 mg Oral BID  . melatonin  5 mg Oral QHS  . metoprolol succinate  12.5 mg Oral Daily  . multivitamin with minerals  1 tablet Oral Daily  . nicotine  21 mg Transdermal Daily  . NIFEdipine  60 mg Oral Daily  . OLANZapine  10 mg Oral BID  . pantoprazole  40 mg Oral Daily  . polyethylene glycol  17 g Oral Daily  . predniSONE  60 mg Oral Q breakfast  . sodium chloride flush  3 mL Intravenous Q12H  . sucralfate  1 g Oral TID WC & HS  . valproic acid  500 mg Oral TID   Continuous Infusions: . sodium chloride    . sodium  chloride 75 mL/hr at 10/25/19 1250    Active Problems:   Chronic hyponatremia   Essential hypertension   GERD   Schizoaffective disorder, bipolar type (HCC)   COPD exacerbation (HCC)   Acute on chronic respiratory failure with hypoxia and hypercapnia (HCC)   Anemia   Anxiety and depression   Irritability   Aggressive behavior   Agitation   Type 2 diabetes mellitus without complication (HCC)   Acute on chronic combined systolic and diastolic congestive heart failure (HCC)   Hyponatremia   Acute on chronic respiratory failure with hypoxemia (HCC)   Class 2 obesity with body mass index (BMI) of 39.0 to 39.9 in adult   Acute respiratory failure ([place])   LOS: 7 days   A & P   No problems updated.  Acute on chronic hypoxic respiratory failure: Etiology COPD with acute on chronic combined heart failure. Likely some element of cor pulmonale as well due to restrictive lung disease. Sh eis currently saturating 98% on 3 L by nasal cannula which is her baseline.  Acute on chronic combined heart failure: I appreciate cardiology's assistance.  Patient has been diuresing well on Lasix 60 IV twice daily. She is now negative 7.1L.  Will change to Lasix 60 p.o. twice daily per cardiology recommendations. They note echocardiogram with EF to 50 to 55% with global hypokinesis and grade 1 diastolic dysfunction, much improved from last month. She is currently 9.23 L negative in terms of fluid balance. Will convert to oral lasix.   Patient initiated on Toprol XL 12.5 daily which she is tolerating well.   She had previously developed symptomatic bradycardia on a beta-blocker, follow heart rate closely. Continue lisinopril. Florinef was recently discontinued, consideration of midodrine if blood pressure drops per previous notes.  Hyperkalemia: Patient is on spironolactone.   COPD exacerbation: Patient has been tapered down to Solu-Medrol 60 mg daily. Oxygen requirements are at baseline.  Continue  bronchodilators. No sign of pulmonary infection.  Hyponatremia: Acute on chronic likely due to psychogenic polydipsia. 129 earlier this month. 126 today. Drop is likely due to over diuresis. Will convert to oral lasix.  Monitor.  Hypertension: Blood pressures high today. Spironolactone discontinued. Chlorthalidone added to lasix, metoprolol, nifedipine, and lisinopril.   Positive blood culture: 1/4 positive for coag-negative staph. Contaminant. Surveillance cultures have had no growth.   Hypokalemia: Resolved. Monitor.  Schizoaffective bipolar disorder: Continue Zyprexa, Depakene, Latuda, Wellbutrin and Klonopin.  Type 2 diabetes mellitus: Fasting glucose is well controlled. Will check HbA1c and follow glucoses with FSBS and SSI.  I have seen and examined this patient myself. I have spent 32 minutes in her evaluation and care.  DVT Prophylaxis: Lovenox CODE STATUS: DNR Family Communication: None available Disposition: Patient is from SNF. Anticipate discharge to  SNF. The patient is medically cleared for discharge.  Jac Romulus, DO [facility] Direct contact: see [email or web address]  7PM-7AM contact night coverage as above 10/25/2019, 4:10 PM  LOS: 5 days

## 2019-10-25 NOTE — Progress Notes (Signed)
I concur with [employer]oyt [employer] nursing student documentation. BBR

## 2019-10-25 NOTE — TOC Progression Note (Signed)
Transition of Care [facility]) - Progression Note    Patient Details  Name: Natalie Thomas MRN: [id number] Date of Birth: [birth year]  Transition of Care [facility]) CM/SW Contact  Boris Sharper, LCSW Phone Number: 10/25/2019, 3:14 PM  Clinical Narrative:    Pt medially stable to discharge per MD. CSW is unable to get legal guardian on the phone at this time, to notify her of discharge. Voicemail is not set up for legal guardian. CSW contacted pt and she gave a new number [phone] and this number did not work either. CSW contacted  pt's brother for a better number and he stated that he had not talked to the legal guardian in awhile and did not have a number for her.    Expected Discharge Plan: Skilled Nursing Facility Barriers to Discharge: Continued Medical Work up  Expected Discharge Plan and Services Expected Discharge Plan: [place] arrangements for the past 2 months: [place] Expected Discharge Date: 10/25/19                                     Social Determinants of Health (SDOH) Interventions    Readmission Risk Interventions Readmission Risk Prevention Plan 10/19/2019 09/15/2019 09/08/2019  Transportation Screening Complete Complete Complete  Medication Review Press photographer) Complete Complete Complete  PCP or Specialist appointment within 3-5 days of discharge Complete Complete Complete  HRI or Home Care Consult Complete Complete Complete  [place] or Home Care Consult Pt Refusal Comments - - -  SW Recovery Care/Counseling Consult - - Complete  Palliative Care Screening - - Not Applicable  Skilled Nursing Facility Complete Complete Complete  Some recent data might be hidden

## 2019-10-25 NOTE — Discharge Summary (Signed)
Physician Discharge Summary  Natalie Thomas [id number] DOB: May 14, 1961 DOA: 10/18/2019  PCP: System, Pcp Not In  Admit date: 10/18/2019 Discharge date: 10/25/2019  Recommendations for Outpatient Follow-up:  Discharge to SNF with PT/OT Follow up with Cardiology 10:00 am on Tuesday 10/27/2019. Follow up with PCP in 7-10 days. Chemistry to be drawn on the day of the visit. Weigh patient daily and record weights. Notify facility physician if the patient gains 3 lbs or more in 24 hours or 5 lbs or more in one week. Follow-up Information    [place] Follow up on 10/30/2019.   Specialty: Cardiology Why: at 12:00pm. Enter through the [place] entrance Contact information: [place] [phone]         Discharge Diagnoses: Principal diagnosis is #1 1. Acute on chronic hypoxic respiratory failure 2. Acute on chronic systolic CHF 3. Hyperkalemia 4. DM II 5. COPD exacerbation 6. Hypokalemia 7. Hypertension 8. Schizophrenia  9. Bipolar disorder  Discharge Condition: Fair  Disposition: SNF  Diet recommendation: Heart healthy and carbohydrate modified  Filed Weights   10/22/19 0700 10/24/19 0500 10/25/19 [id number]  Weight: 103.2 kg 103.5 kg 103.8 kg   History of present illness:  Natalie Thomas  is a 59 y.o. female with a known history of anxiety, chronic depression, schizoaffective disorder, COPD on home oxygen, type II diabetes, hypertension, morbid obesity comes from [place] with shortness of breath respiratory distress and hypoxia. Per EMS staff noticed increased work of breathing started today. She is normally on 4 L nasal cannula oxygen sats dropped out in the mid-70s. She was placed on not rebreather with improvement to 80s.   ED course: Ultimately placed on CPAP with improvement and in the emergency room she was transitioning to BiPAP with oxygen saturations  95%. Patient complained of some tightness tightness did not tolerate the BiPAP setting hands now change to CPAP. Patient currently is setting 97% on current CPAP setting. Her blood pressure is stable. She is able to complete sentence is remaining on CPAP.  pt has been reported patient has been drinking significant amount of water at the facility noted by staff despite her on fluid restriction.-- sHe has history of psychogenic polydipsia.  BNP was 800. She received Lasix.  Pt is being admitted with acute on chronic hypoxic respiratory failure secondary to CHF combined systolic diastolic.  Hospital Course:  [facility] were consulted to admit the patient for further evaluation and treatment. She was admitted to a telemetry bed. Cardiology was consulted. She was given diuresis, nebulizer treatments and steroids.   The patient states that she is feeling a little better. She is back to her baseline oxygen requirements.  The patient will be discharged back to her facility. She will follow up with cardiology as outpatient on 10/27/2019 at 10:00.  Today's assessment: S: The patient is resting comfortably. No new complaints. O: Vitals:  Vitals:   10/25/19 1028 10/25/19 1115  BP: (!) 204/55 115/77  Pulse: 61 62  Resp:    Temp:    SpO2:  95%   Exam:  Constitutional:  The patient is awake, alert, and oriented x 3. No acute distress. Respiratory:   Mild increased work of breathing with conversation.  No wheezes, rales, or rhonchi  No tactile fremitus Cardiovascular:   Regular rate and rhythm  No murmurs, ectopy, or gallups.  No lateral PMI. No thrills. Abdomen:   Abdomen is soft, non-tender, non-distended  No hernias, masses, or organomegaly  Normoactive bowel sounds.  Musculoskeletal:   No cyanosis, clubbing  +3 pitting edema of lower extremities bilaterally Skin:   No rashes, lesions, ulcers  palpation of skin: no induration or  nodules Neurologic:   CN 2-12 intact  Sensation all 4 extremities intact Psychiatric:   Mental status ? Mood, affect appropriate ? Orientation to person, place, time   judgment and insight appear intact  Discharge Instructions  Discharge Instructions    Activity as tolerated - No restrictions   Complete by: As directed    Call MD for:   Complete by: As directed    Increasing edema.   Call MD for:  difficulty breathing, headache or visual disturbances   Complete by: As directed    Call MD for:  persistant dizziness or light-headedness   Complete by: As directed    Diet - low sodium heart healthy   Complete by: As directed    Discharge instructions   Complete by: As directed    Discharge to SNF with PT/OT Follow up with Cardiology 10:00 am on Tuesday 10/27/2019. Follow up with PCP in 7-10 days. Chemistry to be drawn on the day of the visit. Weigh patient daily and record weights. Notify facility physician if the patient gains 3 lbs or more in 24 hours or 5 lbs or more in one week.   Increase activity slowly   Complete by: As directed      Allergies as of 10/25/2019      Reactions   Clarithromycin Itching   Penicillins Itching   Has tolerated cefazolin before Has patient had a PCN reaction causing immediate rash, facial/tongue/throat swelling, SOB or lightheadedness with hypotension: No Has patient had a PCN reaction causing severe rash involving mucus membranes or skin necrosis: No Has patient had a PCN reaction that required hospitalization: Unknown Has patient had a PCN reaction occurring within the last 10 years: No If all of the above answers are "NO", then may proceed with Cephalosporin use.      Medication List    STOP taking these medications   fludrocortisone 0.1 MG tablet Commonly known as: FLORINEF   loperamide 2 MG capsule Commonly known as: IMODIUM   spironolactone 25 MG tablet Commonly known as: ALDACTONE     TAKE these medications    acetaminophen 500 MG tablet Commonly known as: TYLENOL Take 1,000 mg by mouth every 8 (eight) hours as needed for mild pain or moderate pain. Not to exceed 3g/24h of tylenol from all sources.   albuterol 108 (90 Base) MCG/ACT inhaler Commonly known as: VENTOLIN HFA Inhale 2 puffs into the lungs every 6 (six) hours as needed for wheezing or shortness of breath.   albuterol (2.5 MG/3ML) 0.083% nebulizer solution Commonly known as: PROVENTIL Take 3 mLs (2.5 mg total) by nebulization every 4 (four) hours as needed for wheezing or shortness of breath.   ANTACID LIQUID PO Take 30 mLs by mouth every 6 (six) hours as needed (heartburn).   ascorbic acid 1000 MG tablet Commonly known as: VITAMIN C Take 1 tablet (1,000 mg total) by mouth daily.   aspirin 81 MG EC tablet Take 1 tablet (81 mg total) by mouth daily. Start taking on: Oct 26, 2019   Breo Ellipta 100-25 MCG/INH Aepb Generic drug: fluticasone furoate-vilanterol Inhale 1 puff into the lungs daily.   buPROPion 150 MG 24 hr tablet Commonly known as: WELLBUTRIN XL Take 150 mg by mouth daily.   Calcium Carb-Cholecalciferol 600-400 MG-UNIT Tabs  Take 1 tablet by mouth daily.   chlorthalidone 50 MG tablet Commonly known as: HYGROTON Take 1 tablet (50 mg total) by mouth daily. Start taking on: Oct 26, 2019   clonazePAM 1 MG tablet Commonly known as: KLONOPIN Take 0.5 tablets (0.5 mg total) by mouth 2 (two) times daily. What changed: how much to take   docusate sodium 100 MG capsule Commonly known as: COLACE Take 100 mg by mouth 2 (two) times daily.   feeding supplement (ENSURE ENLIVE) Liqd Take 237 mLs by mouth daily.   furosemide 20 MG tablet Commonly known as: LASIX Take 3 tablets (60 mg total) by mouth 2 (two) times daily. What changed:   medication strength  how much to take  when to take this   hydrOXYzine 25 MG tablet Commonly known as: ATARAX/VISTARIL Take 25 mg by mouth every 6 (six) hours as needed for  anxiety or itching.   ipratropium-albuterol 0.5-2.5 (3) MG/3ML Soln Commonly known as: DUONEB Take 3 mLs by nebulization 3 (three) times daily.   lisinopril 10 MG tablet Commonly known as: ZESTRIL Take 1 tablet (10 mg total) by mouth daily. Start taking on: Oct 26, 2019   loratadine 10 MG tablet Commonly known as: CLARITIN Take 10 mg by mouth daily.   lurasidone 20 MG Tabs tablet Commonly known as: LATUDA Take 1 tablet (20 mg total) by mouth 2 (two) times daily.   magnesium hydroxide 400 MG/5ML suspension Commonly known as: MILK OF MAGNESIA Take 30 mLs by mouth daily as needed for mild constipation or moderate constipation.   melatonin 5 MG Tabs Take 5 mg by mouth at bedtime.   metoprolol succinate 25 MG 24 hr tablet Commonly known as: TOPROL-XL Take 0.5 tablets (12.5 mg total) by mouth daily. Start taking on: Oct 26, 2019   multivitamin with minerals Tabs tablet Take 1 tablet by mouth daily.   neomycin-bacitracin-polymyxin ointment Commonly known as: NEOSPORIN Apply 1 application topically as needed for wound care.   nicotine 21 mg/24hr patch Commonly known as: NICODERM CQ - dosed in mg/24 hours Place 21 mg onto the skin daily.   NIFEdipine 60 MG 24 hr tablet Commonly known as: ADALAT CC Take 1 tablet (60 mg total) by mouth daily. Start taking on: Oct 26, 2019 What changed:   medication strength  how much to take   nystatin powder Generic drug: nystatin Apply 1 application topically 2 (two) times daily as needed (irritation). Apply topically to reddened area twice daily as needed for irritation   OLANZapine 10 MG tablet Commonly known as: ZYPREXA Take 10 mg by mouth 2 (two) times daily.   ondansetron 8 MG tablet Commonly known as: ZOFRAN Take 8 mg by mouth every 6 (six) hours as needed for nausea or vomiting.   pantoprazole 40 MG tablet Commonly known as: Protonix Take 1 tablet (40 mg total) by mouth daily.   polyethylene glycol 17 g packet Commonly  known as: MiraLax Take 17 g by mouth daily. What changed: additional instructions   predniSONE 20 MG tablet Commonly known as: DELTASONE Take 3 tablets (60 mg total) by mouth daily with breakfast for 2 days, THEN 2.5 tablets (50 mg total) daily with breakfast for 3 days, THEN 2 tablets (40 mg total) daily with breakfast for 3 days, THEN 1.5 tablets (30 mg total) daily with breakfast for 3 days, THEN 1 tablet (20 mg total) daily with breakfast for 3 days, THEN 0.5 tablets (10 mg total) daily with breakfast for 3 days. Start taking  on: Oct 26, 2019   sodium chloride 0.65 % Soln nasal spray Commonly known as: OCEAN Place 2 sprays into both nostrils every 2 (two) hours as needed (dryness, irritation).   sucralfate 1 GM/10ML suspension Commonly known as: CARAFATE Take 10 mLs (1 g total) by mouth 4 (four) times daily -  with meals and at bedtime.   valproic acid 250 MG capsule Commonly known as: DEPAKENE Take 2 capsules (500 mg total) by mouth 3 (three) times daily.      Allergies  Allergen Reactions  . Clarithromycin Itching  . Penicillins Itching    Has tolerated cefazolin before  Has patient had a PCN reaction causing immediate rash, facial/tongue/throat swelling, SOB or lightheadedness with hypotension: No Has patient had a PCN reaction causing severe rash involving mucus membranes or skin necrosis: No Has patient had a PCN reaction that required hospitalization: Unknown Has patient had a PCN reaction occurring within the last 10 years: No If all of the above answers are "NO", then may proceed with Cephalosporin use.     The results of significant diagnostics from this hospitalization (including imaging, microbiology, ancillary and laboratory) are listed below for reference.    Significant Diagnostic Studies: DG Chest Portable 1 View  Result Date: 10/18/2019 CLINICAL DATA:  59 year old female with shortness of breath. EXAM: PORTABLE CHEST 1 VIEW COMPARISON:  Chest radiograph  dated 10/01/2019. FINDINGS: Cardiomegaly with vascular congestion and edema. Probable small bilateral pleural effusions and bibasilar atelectasis or infiltrate. No pneumothorax. Atherosclerotic calcification of the aorta. No acute osseous pathology. IMPRESSION: Cardiomegaly with findings of CHF and probable small bilateral pleural effusions. Pneumonia is not excluded. Electronically Signed   By: Anner Crete M.D.   On: 10/18/2019 20:14   DG Chest Port 1 View  Result Date: 10/01/2019 CLINICAL DATA:  Shortness of breath and chest tightness. EXAM: PORTABLE CHEST 1 VIEW COMPARISON:  09/23/2019 FINDINGS: 1554 hours. The cardio pericardial silhouette is enlarged. There is pulmonary vascular congestion without overt pulmonary edema. Likely associated interstitial edema. Bibasilar atelectasis/infiltrate with small bilateral pleural effusions. Bones are diffusely demineralized. Telemetry leads overlie the chest. IMPRESSION: Cardiomegaly with vascular congestion and likely interstitial edema. Basilar atelectasis/infiltrate with small bilateral effusions. Electronically Signed   By: Misty Stanley M.D.   On: 10/01/2019 16:30   ECHOCARDIOGRAM COMPLETE  Result Date: 10/22/2019    ECHOCARDIOGRAM REPORT   Patient Name:   Natalie Thomas Date of Exam: 10/21/2019 Medical Rec #:  [id number]          Height:       61.0 in Accession #:    [id number]         Weight:       230.4 lb Date of Birth:  [birth year]           BSA:          2.006 m Patient Age:    53 years           BP:           179/71 mmHg Patient Gender: F                  HR:           73 bpm. Exam Location:  [facility] Procedure: 2D Echo, Cardiac Doppler and Color Doppler Indications:     CHF- acute diastolic [id number], CHF-acute systolic [id number]  History:         Patient has prior history of Echocardiogram examinations, most  recent 09/14/2019. CHF, COPD; Risk Factors:Hypertension.  Sonographer:     Sherrie Sport RDCS (AE) Referring Phys:  [id number] [place] Diagnosing Phys: Neoma Laming MD  Sonographer Comments: Technically challenging study due to limited acoustic windows, no apical window and no subcostal window. IMPRESSIONS  1. Left ventricular ejection fraction, by estimation, is 50 to 55%. The left ventricle has low normal function. The left ventricle demonstrates global hypokinesis. Left ventricular diastolic parameters are consistent with Grade I diastolic dysfunction (impaired relaxation).  2. Right ventricular systolic function is normal. The right ventricular size is normal.  3. The mitral valve is normal in structure. Mild to moderate mitral valve regurgitation. No evidence of mitral stenosis.  4. The aortic valve is normal in structure. Aortic valve regurgitation is not visualized. No aortic stenosis is present.  5. The inferior vena cava is normal in size with greater than 50% respiratory variability, suggesting right atrial pressure of 3 mmHg. FINDINGS  Left Ventricle: Left ventricular ejection fraction, by estimation, is 50 to 55%. The left ventricle has low normal function. The left ventricle demonstrates global hypokinesis. The left ventricular internal cavity size was normal in size. There is no left ventricular hypertrophy. Left ventricular diastolic parameters are consistent with Grade I diastolic dysfunction (impaired relaxation). Right Ventricle: The right ventricular size is normal. No increase in right ventricular wall thickness. Right ventricular systolic function is normal. Left Atrium: Left atrial size was normal in size. Right Atrium: Right atrial size was normal in size. Pericardium: A small pericardial effusion is present. The pericardial effusion is circumferential, posterior and lateral to the left ventricle and posterior to the left ventricle. Mitral Valve: The mitral valve is normal in structure. Normal mobility of the mitral valve leaflets. Mild to moderate mitral valve regurgitation. No evidence of mitral valve stenosis.  Tricuspid Valve: The tricuspid valve is normal in structure. Tricuspid valve regurgitation is mild . No evidence of tricuspid stenosis. Aortic Valve: The aortic valve is normal in structure. Aortic valve regurgitation is not visualized. No aortic stenosis is present. Pulmonic Valve: The pulmonic valve was normal in structure. Pulmonic valve regurgitation is trivial. No evidence of pulmonic stenosis. Aorta: The aortic root is normal in size and structure. Venous: The inferior vena cava is normal in size with greater than 50% respiratory variability, suggesting right atrial pressure of 3 mmHg. IAS/Shunts: No atrial level shunt detected by color flow Doppler.  LEFT VENTRICLE PLAX 2D LVIDd:         4.77 cm LVIDs:         3.45 cm LV PW:         1.37 cm LV IVS:        1.78 cm LVOT diam:     2.00 cm LVOT Area:     3.14 cm  LEFT ATRIUM         Index LA diam:    5.90 cm 2.94 cm/m                        PULMONIC VALVE AORTA                 PV Vmax:        0.90 m/s Ao Root diam: 2.80 cm PV Peak grad:   3.2 mmHg                       RVOT Peak grad: 4 mmHg   SHUNTS Systemic Diam: 2.00 cm [employer]  Humphrey Rolls MD Electronically signed by Neoma Laming MD Signature Date/Time: 10/22/2019/10:39:19 AM    Final     Microbiology: Recent Results (from the past 240 hour(s))  Respiratory Panel by RT PCR (Flu A&B, Covid) - Nasopharyngeal Swab     Status: None   Collection Time: 10/18/19  8:56 PM   Specimen: Nasopharyngeal Swab  Result Value Ref Range Status   SARS Coronavirus 2 by RT PCR NEGATIVE NEGATIVE Final    Comment: (NOTE) SARS-CoV-2 target nucleic acids are NOT DETECTED. The SARS-CoV-2 RNA is generally detectable in upper respiratoy specimens during the acute phase of infection. The lowest concentration of SARS-CoV-2 viral copies this assay can detect is 131 copies/mL. A negative result does not preclude SARS-Cov-2 infection and should not be used as the sole basis for treatment or other patient management decisions. A  negative result may occur with  improper specimen collection/handling, submission of specimen other than nasopharyngeal swab, presence of viral mutation(s) within the areas targeted by this assay, and inadequate number of viral copies (<131 copies/mL). A negative result must be combined with clinical observations, patient history, and epidemiological information. The expected result is Negative. Fact Sheet for Patients:  PinkCheek.be Fact Sheet for Healthcare Providers:  GravelBags.it This test is not yet ap proved or cleared by the [place] FDA and  has been authorized for detection and/or diagnosis of SARS-CoV-2 by FDA under an Emergency Use Authorization (EUA). This EUA will remain  in effect (meaning this test can be used) for the duration of the COVID-19 declaration under Section 564(b)(1) of the Act, 21 U.S.C. section 360bbb-3(b)(1), unless the authorization is terminated or revoked sooner.    Influenza A by PCR NEGATIVE NEGATIVE Final   Influenza B by PCR NEGATIVE NEGATIVE Final    Comment: (NOTE) The Xpert Xpress SARS-CoV-2/FLU/RSV assay is intended as an aid in  the diagnosis of influenza from Nasopharyngeal swab specimens and  should not be used as a sole basis for treatment. Nasal washings and  aspirates are unacceptable for Xpert Xpress SARS-CoV-2/FLU/RSV  testing. Fact Sheet for Patients: PinkCheek.be Fact Sheet for Healthcare Providers: GravelBags.it This test is not yet approved or cleared by the [place] FDA and  has been authorized for detection and/or diagnosis of SARS-CoV-2 by  FDA under an Emergency Use Authorization (EUA). This EUA will remain  in effect (meaning this test can be used) for the duration of the  Covid-19 declaration under Section 564(b)(1) of the Act, 21  U.S.C. section 360bbb-3(b)(1), unless the authorization is   terminated or revoked. Performed at [facility], [address] [place], [place] [address]   CULTURE, BLOOD (ROUTINE X 2) w Reflex to ID Panel     Status: Abnormal   Collection Time: 10/18/19 11:42 PM   Specimen: BLOOD  Result Value Ref Range Status   Specimen Description   Final    BLOOD BLOOD LEFT FOREARM Performed at [facility], [address] [place], [place] [address]    Special Requests   Final    BOTTLES DRAWN AEROBIC AND ANAEROBIC Blood Culture results may not be optimal due to an inadequate volume of blood received in culture bottles Performed at [facility], [address] [place], [place] [address]    Culture  Setup Time   Final    GRAM POSITIVE COCCI IN BOTH AEROBIC AND ANAEROBIC BOTTLES CRITICAL RESULT CALLED TO, READ BACK BY AND VERIFIED WITH: ABBY ELLINGTON AT [id number] 4826/21 SDR    Culture (A)  Final  STAPHYLOCOCCUS SPECIES (COAGULASE NEGATIVE) THE SIGNIFICANCE OF ISOLATING THIS ORGANISM FROM A SINGLE SET OF BLOOD CULTURES WHEN MULTIPLE SETS ARE DRAWN IS UNCERTAIN. PLEASE NOTIFY THE MICROBIOLOGY DEPARTMENT WITHIN ONE WEEK IF SPECIATION AND SENSITIVITIES ARE REQUIRED. Performed at [facility], [place] [address], [place], [place] [address]    Report Status 10/22/2019 FINAL  Final  Blood Culture ID Panel (Reflexed)     Status: Abnormal   Collection Time: 10/18/19 11:42 PM  Result Value Ref Range Status   Enterococcus species NOT DETECTED NOT DETECTED Final   Listeria monocytogenes NOT DETECTED NOT DETECTED Final   Staphylococcus species DETECTED (A) NOT DETECTED Final    Comment: Methicillin (oxacillin) resistant coagulase negative staphylococcus. Possible blood culture contaminant (unless isolated from more than one blood culture draw or clinical case suggests pathogenicity). No antibiotic treatment is indicated for blood  culture contaminants. CRITICAL RESULT CALLED TO, READ BACK BY AND VERIFIED WITH:  ABBY ELLINGTON AT  [id number] 10/19/19 SDR    Staphylococcus aureus (BCID) NOT DETECTED NOT DETECTED Final   Methicillin resistance DETECTED (A) NOT DETECTED Final    Comment: CRITICAL RESULT CALLED TO, READ BACK BY AND VERIFIED WITH:  ABBY ELLINGTON AT [id number] 10/19/19 SDR    Streptococcus species NOT DETECTED NOT DETECTED Final   Streptococcus agalactiae NOT DETECTED NOT DETECTED Final   Streptococcus pneumoniae NOT DETECTED NOT DETECTED Final   Streptococcus pyogenes NOT DETECTED NOT DETECTED Final   Acinetobacter baumannii NOT DETECTED NOT DETECTED Final   Enterobacteriaceae species NOT DETECTED NOT DETECTED Final   Enterobacter cloacae complex NOT DETECTED NOT DETECTED Final   Escherichia coli NOT DETECTED NOT DETECTED Final   Klebsiella oxytoca NOT DETECTED NOT DETECTED Final   Klebsiella pneumoniae NOT DETECTED NOT DETECTED Final   Proteus species NOT DETECTED NOT DETECTED Final   Serratia marcescens NOT DETECTED NOT DETECTED Final   Haemophilus influenzae NOT DETECTED NOT DETECTED Final   Neisseria meningitidis NOT DETECTED NOT DETECTED Final   Pseudomonas aeruginosa NOT DETECTED NOT DETECTED Final   Candida albicans NOT DETECTED NOT DETECTED Final   Candida glabrata NOT DETECTED NOT DETECTED Final   Candida krusei NOT DETECTED NOT DETECTED Final   Candida parapsilosis NOT DETECTED NOT DETECTED Final   Candida tropicalis NOT DETECTED NOT DETECTED Final    Comment: Performed at [facility], [address] [place], [place] [address]  CULTURE, BLOOD (ROUTINE X 2) w Reflex to ID Panel     Status: None   Collection Time: 10/19/19  4:24 AM   Specimen: BLOOD  Result Value Ref Range Status   Specimen Description BLOOD BLOOD RIGHT WRIST  Final   Special Requests   Final    BOTTLES DRAWN AEROBIC AND ANAEROBIC Blood Culture results may not be optimal due to an excessive volume of blood received in culture bottles   Culture   Final    NO GROWTH 5 DAYS Performed at [facility], [address] [place], [place] [address]    Report Status 10/24/2019 FINAL  Final  MRSA PCR Screening     Status: None   Collection Time: 10/19/19  2:00 PM   Specimen: Nasal Mucosa; Nasopharyngeal  Result Value Ref Range Status   MRSA by PCR NEGATIVE NEGATIVE Final    Comment:        The GeneXpert MRSA Assay (FDA approved for NASAL specimens only), is one component of a comprehensive MRSA colonization surveillance program. It is not intended to diagnose MRSA infection nor to guide  or monitor treatment for MRSA infections. Performed at [facility], [address] [place], [place] [address]      Labs: Basic Metabolic Panel: Recent Labs  Lab 10/22/19 0440 10/23/19 0537 10/24/19 0506 10/24/19 1744 10/25/19 0349  NA 126* 127* 126* 126* 130*  K 5.4* 5.0 5.1 5.7* 4.9  CL 77* 77* 75* 78* 81*  CO2 39* 41* 39* 34* 40*  GLUCOSE 90 113* 127* 174* 120*  BUN 50* 60* 74* 73* 71*  CREATININE 0.79 1.03* 1.10* 1.20* 0.95  CALCIUM 8.7* 8.8* 8.7* 8.8* 8.6*   Liver Function Tests: No results for input(s): AST, ALT, ALKPHOS, BILITOT, PROT, ALBUMIN in the last 168 hours. No results for input(s): LIPASE, AMYLASE in the last 168 hours. No results for input(s): AMMONIA in the last 168 hours. CBC: Recent Labs  Lab 10/21/19 0421 10/22/19 0440 10/23/19 0537 10/24/19 0506 10/25/19 0349  WBC 8.9 8.8 9.9 11.2* 10.9*  NEUTROABS 7.1 6.0 6.7 7.9* 7.7  HGB 10.2* 10.3* 10.4* 10.0* 10.7*  HCT 30.1* 32.3* 31.2* 30.6* 32.7*  MCV 86.5 89.5 87.9 88.7 88.4  PLT 210 233 253 255 264   Cardiac Enzymes: No results for input(s): CKTOTAL, CKMB, CKMBINDEX, TROPONINI in the last 168 hours. BNP: BNP (last 3 results) Recent Labs    09/22/19 1535 10/01/19 1519 10/18/19 2002  BNP 624.0* 800.0* 580.0*    ProBNP (last 3 results) No results for input(s): PROBNP in the last 8760 hours.  CBG: Recent Labs  Lab 10/19/19 0322  GLUCAP 157*    Active Problems:   Chronic hyponatremia   Essential  hypertension   GERD   Schizoaffective disorder, bipolar type (HCC)   COPD exacerbation (HCC)   Acute on chronic respiratory failure with hypoxia and hypercapnia (HCC)   Anemia   Anxiety and depression   Irritability   Aggressive behavior   Agitation   Type 2 diabetes mellitus without complication (HCC)   Acute on chronic combined systolic and diastolic congestive heart failure (HCC)   Hyponatremia   Acute on chronic respiratory failure with hypoxemia (HCC)   Class 2 obesity with body mass index (BMI) of 39.0 to 39.9 in adult   Acute respiratory failure ([place])  Time coordinating discharge: 38 minutes.  Signed:        Khyren Hing, DO [facility]  10/25/2019, 3:12 PM

## 2019-10-25 NOTE — Progress Notes (Signed)
Patient's BP was elevated 204/55 with pulse of 61 so she was given Metroprolol. She did have a bowel movement and when her BP was rechecked afterwards it was 115/77 with a pulse of 88. The nurse was notified.

## 2019-10-26 LAB — GLUCOSE, CAPILLARY
Glucose-Capillary: 192 mg/dL — ABNORMAL HIGH (ref 70–99)
Glucose-Capillary: 144 mg/dL — ABNORMAL HIGH (ref 70–99)
Glucose-Capillary: 124 mg/dL — ABNORMAL HIGH (ref 70–99)
Glucose-Capillary: 177 mg/dL — ABNORMAL HIGH (ref 70–99)
Glucose-Capillary: 144 mg/dL — ABNORMAL HIGH (ref 70–99)
Glucose-Capillary: 71 mg/dL (ref 70–99)
Glucose-Capillary: 117 mg/dL — ABNORMAL HIGH (ref 70–99)
Glucose-Capillary: 170 mg/dL — ABNORMAL HIGH (ref 70–99)
Glucose-Capillary: 187 mg/dL — ABNORMAL HIGH (ref 70–99)
Glucose-Capillary: 92 mg/dL (ref 70–99)
Glucose-Capillary: 96 mg/dL (ref 70–99)

## 2019-10-26 LAB — CBC WITH DIFFERENTIAL/PLATELET
Abs Immature Granulocytes: 0.07 10*3/uL (ref 0.00–0.07)
Basophils Absolute: 0 10*3/uL (ref 0.0–0.1)
Basophils Relative: 0 %
Eosinophils Absolute: 0 10*3/uL (ref 0.0–0.5)
Eosinophils Relative: 0 %
HCT: 32.4 % — ABNORMAL LOW (ref 36.0–46.0)
Hemoglobin: 10.6 g/dL — ABNORMAL LOW (ref 12.0–15.0)
Immature Granulocytes: 1 %
Lymphocytes Relative: 16 %
Lymphs Abs: 1.6 10*3/uL (ref 0.7–4.0)
MCH: 29.2 pg (ref 26.0–34.0)
MCHC: 32.7 g/dL (ref 30.0–36.0)
MCV: 89.3 fL (ref 80.0–100.0)
Monocytes Absolute: 0.7 10*3/uL (ref 0.1–1.0)
Monocytes Relative: 7 %
Neutro Abs: 8 10*3/uL — ABNORMAL HIGH (ref 1.7–7.7)
Neutrophils Relative %: 76 %
Platelets: 282 10*3/uL (ref 150–400)
RBC: 3.63 MIL/uL — ABNORMAL LOW (ref 3.87–5.11)
RDW: 13.5 % (ref 11.5–15.5)
WBC: 10.5 10*3/uL (ref 4.0–10.5)
nRBC: 0 % (ref 0.0–0.2)

## 2019-10-26 LAB — SARS CORONAVIRUS 2 (TAT 6-24 HRS): SARS Coronavirus 2: NEGATIVE

## 2019-10-26 NOTE — TOC Progression Note (Signed)
Transition of Care [facility]) - Progression Note    Patient Details  Name: Natalie Thomas MRN: [id number] Date of Birth: [birth year]  Transition of Care [facility]) CM/SW [place], RN Phone Number: 10/26/2019, 9:24 AM  Clinical Narrative:     DC packet on the chart, I sent a secure message to the Bedside nurse alerting her of the DC being ready for her to call report to the SNF and let me know once completed so RNCM can call ems  Expected Discharge Plan: [place] Barriers to Discharge: Continued Medical Work up  Expected Discharge Plan and Services Expected Discharge Plan: [place] arrangements for the past 2 months: [place] Expected Discharge Date: 10/25/19                                     Social Determinants of Health (SDOH) Interventions    Readmission Risk Interventions Readmission Risk Prevention Plan 10/19/2019 09/15/2019 09/08/2019  Transportation Screening Complete Complete Complete  Medication Review Press photographer) Complete Complete Complete  PCP or Specialist appointment within 3-5 days of discharge Complete Complete Complete  HRI or Home Care Consult Complete Complete Complete  [place] or Home Care Consult Pt Refusal Comments - - -  SW Recovery Care/Counseling Consult - - Complete  Palliative Care Screening - - Not Applicable  Skilled Nursing Facility Complete Complete Complete  Some recent data might be hidden

## 2019-10-26 NOTE — TOC Progression Note (Signed)
Transition of Care [facility]) - Progression Note    Patient Details  Name: Natalie Thomas MRN: [id number] Date of Birth: [birth year]  Transition of Care [facility]) CM/SW Contact  Su Hilt, RN Phone Number: 10/26/2019, 10:08 AM  Clinical Narrative:    Bedside nurse called report to the SNF, RNCM called EMS for transportation   Expected Discharge Plan: [place] Barriers to Discharge: Continued Medical Work up  Expected Discharge Plan and Services Expected Discharge Plan: [place] arrangements for the past 2 months: [place] Expected Discharge Date: 10/26/19                                     Social Determinants of Health (SDOH) Interventions    Readmission Risk Interventions Readmission Risk Prevention Plan 10/19/2019 09/15/2019 09/08/2019  Transportation Screening Complete Complete Complete  Medication Review Press photographer) Complete Complete Complete  PCP or Specialist appointment within 3-5 days of discharge Complete Complete Complete  HRI or Home Care Consult Complete Complete Complete  [place] or Home Care Consult Pt Refusal Comments - - -  SW Recovery Care/Counseling Consult - - Complete  Palliative Care Screening - - Not Applicable  Skilled Nursing Facility Complete Complete Complete  Some recent data might be hidden

## 2019-10-26 NOTE — Plan of Care (Signed)
  Problem: Education: Goal: Knowledge of General Education information will improve Description: Including pain rating scale, medication(s)/side effects and non-pharmacologic comfort measures Outcome: Progressing   Problem: Clinical Measurements: Goal: Ability to maintain clinical measurements within normal limits will improve Outcome: Progressing Goal: Will remain free from infection Outcome: Progressing Goal: Diagnostic test results will improve Outcome: Progressing Goal: Respiratory complications will improve Outcome: Progressing Goal: Cardiovascular complication will be avoided Outcome: Progressing   Problem: Coping: Goal: Level of anxiety will decrease Outcome: Progressing   Problem: Pain Managment: Goal: General experience of comfort will improve Outcome: Progressing   Problem: Safety: Goal: Ability to remain free from injury will improve Outcome: Progressing   

## 2019-10-26 NOTE — TOC Progression Note (Signed)
Transition of Care [facility]) - Progression Note    Patient Details  Name: Natalie Thomas MRN: [id number] Date of Birth: 03-27-1961  Transition of Care [facility]) CM/SW Contact  Su Hilt, RN Phone Number: 10/26/2019, 8:34 AM  Clinical Narrative:    Damaris Schooner to [name] the Legal Guardian with DSS, notified her that the patient would return to [facility] today, she is in agreement I called [facility] and she is able to return today, I notified the physician   Expected Discharge Plan: [place] Barriers to Discharge: Continued Medical Work up  Expected Discharge Plan and Services Expected Discharge Plan: [place]       Living arrangements for the past 2 months: [place] Expected Discharge Date: 10/25/19                                     Social Determinants of Health (SDOH) Interventions    Readmission Risk Interventions Readmission Risk Prevention Plan 10/19/2019 09/15/2019 09/08/2019  Transportation Screening Complete Complete Complete  Medication Review Press photographer) Complete Complete Complete  PCP or Specialist appointment within 3-5 days of discharge Complete Complete Complete  HRI or Home Care Consult Complete Complete Complete  [place] or Home Care Consult Pt Refusal Comments - - -  SW Recovery Care/Counseling Consult - - Complete  Palliative Care Screening - - Not Applicable  Skilled Nursing Facility Complete Complete Complete  Some recent data might be hidden

## 2019-10-26 NOTE — Discharge Summary (Signed)
Physician Discharge Summary  Natalie Thomas [id number] DOB: 05/06/1961 DOA: 10/18/2019  PCP: System, Pcp Not In  Admit date: 10/18/2019 Discharge date: 10/26/2019  Recommendations for Outpatient Follow-up:  Discharge to SNF with PT/OT Follow up with Cardiology 10:00 am on Tuesday 10/27/2019. Follow up with PCP in 7-10 days. Chemistry to be drawn on the day of the visit. Weigh patient daily and record weights. Notify facility physician if the patient gains 3 lbs or more in 24 hours or 5 lbs or more in one week. 1200 Fluid Restriction. No water at bedside.  Follow-up Information    [facility] Follow up on 10/30/2019.   Specialty: Cardiology Why: at 12:00pm. Enter through the [facility] entrance Contact information: [place] [phone]         Discharge Diagnoses: Principal diagnosis is #1 1. Acute on chronic hypoxic respiratory failure 2. Acute on chronic systolic CHF 3. Hyperkalemia 4. DM II 5. COPD exacerbation 6. Hypokalemia 7. Hypertension 8. Schizophrenia  9. Bipolar disorder  Discharge Condition: Fair  Disposition: SNF  Diet recommendation: Heart healthy and carbohydrate modified  Filed Weights   10/24/19 0500 10/25/19 [id number] 10/26/19 0500  Weight: 103.5 kg 103.8 kg 102.6 kg   History of present illness:  Natalie Thomas  is a 59 y.o. female with a known history of anxiety, chronic depression, schizoaffective disorder, COPD on home oxygen, type II diabetes, hypertension, morbid obesity comes from [place] with shortness of breath respiratory distress and hypoxia. Per EMS staff noticed increased work of breathing started today. She is normally on 4 L nasal cannula oxygen sats dropped out in the mid-70s. She was placed on not rebreather with improvement to 80s.   ED course: Ultimately placed on CPAP with improvement and in the emergency room she was  transitioning to BiPAP with oxygen saturations 95%. Patient complained of some tightness tightness did not tolerate the BiPAP setting hands now change to CPAP. Patient currently is setting 97% on current CPAP setting. Her blood pressure is stable. She is able to complete sentence is remaining on CPAP.  pt has been reported patient has been drinking significant amount of water at the facility noted by staff despite her on fluid restriction.-- sHe has history of psychogenic polydipsia.  BNP was 800. She received Lasix.  Pt is being admitted with acute on chronic hypoxic respiratory failure secondary to CHF combined systolic diastolic.  Hospital Course:  [facility] were consulted to admit the patient for further evaluation and treatment. She was admitted to a telemetry bed. Cardiology was consulted. She was given diuresis, nebulizer treatments and steroids.   The patient states that she is feeling a little better. She is back to her baseline oxygen requirements.  The patient will be discharged back to her facility. She will follow up with cardiology as outpatient on 10/27/2019 at 10:00.  Today's assessment: S: The patient is resting comfortably. No new complaints. O: Vitals:  Vitals:   10/26/19 0729 10/26/19 0802  BP: (!) 128/97   Pulse: (!) 55   Resp:    Temp: (!) 97.5 F (36.4 C)   SpO2: 99% 96%   Exam:  Constitutional:  The patient is awake, alert, and oriented x 3. No acute distress. Respiratory:   Mild increased work of breathing with conversation.  No wheezes, rales, or rhonchi  No tactile fremitus Cardiovascular:   Regular rate and rhythm  No murmurs, ectopy, or gallups.  No lateral PMI. No thrills. Abdomen:   Abdomen is soft, non-tender, non-distended  No hernias, masses, or organomegaly  Normoactive bowel sounds.  Musculoskeletal:   No cyanosis, clubbing  +3 pitting edema of lower extremities bilaterally Skin:   No rashes,  lesions, ulcers  palpation of skin: no induration or nodules Neurologic:   CN 2-12 intact  Sensation all 4 extremities intact Psychiatric:   Mental status ? Mood, affect appropriate ? Orientation to person, place, time   judgment and insight appear intact  Discharge Instructions  Discharge Instructions    Activity as tolerated - No restrictions   Complete by: As directed    Call MD for:   Complete by: As directed    Increasing edema.   Call MD for:  difficulty breathing, headache or visual disturbances   Complete by: As directed    Call MD for:  persistant dizziness or light-headedness   Complete by: As directed    Diet - low sodium heart healthy   Complete by: As directed    Discharge instructions   Complete by: As directed    Discharge to SNF with PT/OT Follow up with Cardiology 10:00 am on Tuesday 10/27/2019. Follow up with PCP in 7-10 days. Chemistry to be drawn on the day of the visit. Weigh patient daily and record weights. Notify facility physician if the patient gains 3 lbs or more in 24 hours or 5 lbs or more in one week.   Increase activity slowly   Complete by: As directed      Allergies as of 10/26/2019      Reactions   Clarithromycin Itching   Penicillins Itching   Has tolerated cefazolin before Has patient had a PCN reaction causing immediate rash, facial/tongue/throat swelling, SOB or lightheadedness with hypotension: No Has patient had a PCN reaction causing severe rash involving mucus membranes or skin necrosis: No Has patient had a PCN reaction that required hospitalization: Unknown Has patient had a PCN reaction occurring within the last 10 years: No If all of the above answers are "NO", then may proceed with Cephalosporin use.      Medication List    STOP taking these medications   fludrocortisone 0.1 MG tablet Commonly known as: FLORINEF   loperamide 2 MG capsule Commonly known as: IMODIUM   spironolactone 25 MG tablet Commonly known as:  ALDACTONE     TAKE these medications   acetaminophen 500 MG tablet Commonly known as: TYLENOL Take 1,000 mg by mouth every 8 (eight) hours as needed for mild pain or moderate pain. Not to exceed 3g/24h of tylenol from all sources.   albuterol 108 (90 Base) MCG/ACT inhaler Commonly known as: VENTOLIN HFA Inhale 2 puffs into the lungs every 6 (six) hours as needed for wheezing or shortness of breath.   albuterol (2.5 MG/3ML) 0.083% nebulizer solution Commonly known as: PROVENTIL Take 3 mLs (2.5 mg total) by nebulization every 4 (four) hours as needed for wheezing or shortness of breath.   ANTACID LIQUID PO Take 30 mLs by mouth every 6 (six) hours as needed (heartburn).   ascorbic acid 1000 MG tablet Commonly known as: VITAMIN C Take 1 tablet (1,000 mg total) by mouth daily.   aspirin 81 MG EC tablet Take 1 tablet (81 mg total) by mouth daily.   Breo Ellipta 100-25 MCG/INH Aepb Generic drug: fluticasone furoate-vilanterol Inhale 1 puff into the lungs daily.   buPROPion 150 MG 24 hr tablet Commonly known as: WELLBUTRIN XL Take 150 mg by mouth  daily.   Calcium Carb-Cholecalciferol 600-400 MG-UNIT Tabs Take 1 tablet by mouth daily.   chlorthalidone 50 MG tablet Commonly known as: HYGROTON Take 1 tablet (50 mg total) by mouth daily.   clonazePAM 1 MG tablet Commonly known as: KLONOPIN Take 0.5 tablets (0.5 mg total) by mouth 2 (two) times daily. What changed: how much to take   docusate sodium 100 MG capsule Commonly known as: COLACE Take 100 mg by mouth 2 (two) times daily.   feeding supplement (ENSURE ENLIVE) Liqd Take 237 mLs by mouth daily.   furosemide 20 MG tablet Commonly known as: LASIX Take 3 tablets (60 mg total) by mouth 2 (two) times daily. What changed:   medication strength  how much to take  when to take this   hydrOXYzine 25 MG tablet Commonly known as: ATARAX/VISTARIL Take 25 mg by mouth every 6 (six) hours as needed for anxiety or  itching.   ipratropium-albuterol 0.5-2.5 (3) MG/3ML Soln Commonly known as: DUONEB Take 3 mLs by nebulization 3 (three) times daily.   lisinopril 10 MG tablet Commonly known as: ZESTRIL Take 1 tablet (10 mg total) by mouth daily.   loratadine 10 MG tablet Commonly known as: CLARITIN Take 10 mg by mouth daily.   lurasidone 20 MG Tabs tablet Commonly known as: LATUDA Take 1 tablet (20 mg total) by mouth 2 (two) times daily.   magnesium hydroxide 400 MG/5ML suspension Commonly known as: MILK OF MAGNESIA Take 30 mLs by mouth daily as needed for mild constipation or moderate constipation.   melatonin 5 MG Tabs Take 5 mg by mouth at bedtime.   metoprolol succinate 25 MG 24 hr tablet Commonly known as: TOPROL-XL Take 0.5 tablets (12.5 mg total) by mouth daily.   multivitamin with minerals Tabs tablet Take 1 tablet by mouth daily.   neomycin-bacitracin-polymyxin ointment Commonly known as: NEOSPORIN Apply 1 application topically as needed for wound care.   nicotine 21 mg/24hr patch Commonly known as: NICODERM CQ - dosed in mg/24 hours Place 21 mg onto the skin daily.   NIFEdipine 60 MG 24 hr tablet Commonly known as: ADALAT CC Take 1 tablet (60 mg total) by mouth daily. What changed:   medication strength  how much to take   nystatin powder Generic drug: nystatin Apply 1 application topically 2 (two) times daily as needed (irritation). Apply topically to reddened area twice daily as needed for irritation   OLANZapine 10 MG tablet Commonly known as: ZYPREXA Take 10 mg by mouth 2 (two) times daily.   ondansetron 8 MG tablet Commonly known as: ZOFRAN Take 8 mg by mouth every 6 (six) hours as needed for nausea or vomiting.   pantoprazole 40 MG tablet Commonly known as: Protonix Take 1 tablet (40 mg total) by mouth daily.   polyethylene glycol 17 g packet Commonly known as: MiraLax Take 17 g by mouth daily. What changed: additional instructions   predniSONE 20  MG tablet Commonly known as: DELTASONE Take 3 tablets (60 mg total) by mouth daily with breakfast for 2 days, THEN 2.5 tablets (50 mg total) daily with breakfast for 3 days, THEN 2 tablets (40 mg total) daily with breakfast for 3 days, THEN 1.5 tablets (30 mg total) daily with breakfast for 3 days, THEN 1 tablet (20 mg total) daily with breakfast for 3 days, THEN 0.5 tablets (10 mg total) daily with breakfast for 3 days. Start taking on: Oct 26, 2019   sodium chloride 0.65 % Soln nasal spray Commonly known as:  OCEAN Place 2 sprays into both nostrils every 2 (two) hours as needed (dryness, irritation).   sucralfate 1 GM/10ML suspension Commonly known as: CARAFATE Take 10 mLs (1 g total) by mouth 4 (four) times daily -  with meals and at bedtime.   valproic acid 250 MG capsule Commonly known as: DEPAKENE Take 2 capsules (500 mg total) by mouth 3 (three) times daily.      Allergies  Allergen Reactions  . Clarithromycin Itching  . Penicillins Itching    Has tolerated cefazolin before  Has patient had a PCN reaction causing immediate rash, facial/tongue/throat swelling, SOB or lightheadedness with hypotension: No Has patient had a PCN reaction causing severe rash involving mucus membranes or skin necrosis: No Has patient had a PCN reaction that required hospitalization: Unknown Has patient had a PCN reaction occurring within the last 10 years: No If all of the above answers are "NO", then may proceed with Cephalosporin use.     The results of significant diagnostics from this hospitalization (including imaging, microbiology, ancillary and laboratory) are listed below for reference.    Significant Diagnostic Studies: DG Chest Portable 1 View  Result Date: 10/18/2019 CLINICAL DATA:  59 year old female with shortness of breath. EXAM: PORTABLE CHEST 1 VIEW COMPARISON:  Chest radiograph dated 10/01/2019. FINDINGS: Cardiomegaly with vascular congestion and edema. Probable small bilateral  pleural effusions and bibasilar atelectasis or infiltrate. No pneumothorax. Atherosclerotic calcification of the aorta. No acute osseous pathology. IMPRESSION: Cardiomegaly with findings of CHF and probable small bilateral pleural effusions. Pneumonia is not excluded. Electronically Signed   By: Anner Crete M.D.   On: 10/18/2019 20:14   DG Chest Port 1 View  Result Date: 10/01/2019 CLINICAL DATA:  Shortness of breath and chest tightness. EXAM: PORTABLE CHEST 1 VIEW COMPARISON:  09/23/2019 FINDINGS: 1554 hours. The cardio pericardial silhouette is enlarged. There is pulmonary vascular congestion without overt pulmonary edema. Likely associated interstitial edema. Bibasilar atelectasis/infiltrate with small bilateral pleural effusions. Bones are diffusely demineralized. Telemetry leads overlie the chest. IMPRESSION: Cardiomegaly with vascular congestion and likely interstitial edema. Basilar atelectasis/infiltrate with small bilateral effusions. Electronically Signed   By: Misty Stanley M.D.   On: 10/01/2019 16:30   ECHOCARDIOGRAM COMPLETE  Result Date: 10/22/2019    ECHOCARDIOGRAM REPORT   Patient Name:   SAREENA BENEDICK Date of Exam: 10/21/2019 Medical Rec #:  [id number]          Height:       61.0 in Accession #:    [id number]         Weight:       230.4 lb Date of Birth:  [birth year]           BSA:          2.006 m Patient Age:    74 years           BP:           179/71 mmHg Patient Gender: F                  HR:           73 bpm. Exam Location:  [facility] Procedure: 2D Echo, Cardiac Doppler and Color Doppler Indications:     CHF- acute diastolic [id number], CHF-acute systolic [id number]  History:         Patient has prior history of Echocardiogram examinations, most                  recent 09/14/2019. CHF, COPD; Risk  Factors:Hypertension.  Sonographer:     Sherrie Sport RDCS (AE) Referring Phys:  [id number] [place] Diagnosing Phys: Neoma Laming MD  Sonographer Comments: Technically challenging study due to limited  acoustic windows, no apical window and no subcostal window. IMPRESSIONS  1. Left ventricular ejection fraction, by estimation, is 50 to 55%. The left ventricle has low normal function. The left ventricle demonstrates global hypokinesis. Left ventricular diastolic parameters are consistent with Grade I diastolic dysfunction (impaired relaxation).  2. Right ventricular systolic function is normal. The right ventricular size is normal.  3. The mitral valve is normal in structure. Mild to moderate mitral valve regurgitation. No evidence of mitral stenosis.  4. The aortic valve is normal in structure. Aortic valve regurgitation is not visualized. No aortic stenosis is present.  5. The inferior vena cava is normal in size with greater than 50% respiratory variability, suggesting right atrial pressure of 3 mmHg. FINDINGS  Left Ventricle: Left ventricular ejection fraction, by estimation, is 50 to 55%. The left ventricle has low normal function. The left ventricle demonstrates global hypokinesis. The left ventricular internal cavity size was normal in size. There is no left ventricular hypertrophy. Left ventricular diastolic parameters are consistent with Grade I diastolic dysfunction (impaired relaxation). Right Ventricle: The right ventricular size is normal. No increase in right ventricular wall thickness. Right ventricular systolic function is normal. Left Atrium: Left atrial size was normal in size. Right Atrium: Right atrial size was normal in size. Pericardium: A small pericardial effusion is present. The pericardial effusion is circumferential, posterior and lateral to the left ventricle and posterior to the left ventricle. Mitral Valve: The mitral valve is normal in structure. Normal mobility of the mitral valve leaflets. Mild to moderate mitral valve regurgitation. No evidence of mitral valve stenosis. Tricuspid Valve: The tricuspid valve is normal in structure. Tricuspid valve regurgitation is mild . No evidence  of tricuspid stenosis. Aortic Valve: The aortic valve is normal in structure. Aortic valve regurgitation is not visualized. No aortic stenosis is present. Pulmonic Valve: The pulmonic valve was normal in structure. Pulmonic valve regurgitation is trivial. No evidence of pulmonic stenosis. Aorta: The aortic root is normal in size and structure. Venous: The inferior vena cava is normal in size with greater than 50% respiratory variability, suggesting right atrial pressure of 3 mmHg. IAS/Shunts: No atrial level shunt detected by color flow Doppler.  LEFT VENTRICLE PLAX 2D LVIDd:         4.77 cm LVIDs:         3.45 cm LV PW:         1.37 cm LV IVS:        1.78 cm LVOT diam:     2.00 cm LVOT Area:     3.14 cm  LEFT ATRIUM         Index LA diam:    5.90 cm 2.94 cm/m                        PULMONIC VALVE AORTA                 PV Vmax:        0.90 m/s Ao Root diam: 2.80 cm PV Peak grad:   3.2 mmHg                       RVOT Peak grad: 4 mmHg   SHUNTS Systemic Diam: 2.00 cm Neoma Laming MD Electronically signed by  Neoma Laming MD Signature Date/Time: 10/22/2019/10:39:19 AM    Final     Microbiology: Recent Results (from the past 240 hour(s))  Respiratory Panel by RT PCR (Flu A&B, Covid) - Nasopharyngeal Swab     Status: None   Collection Time: 10/18/19  8:56 PM   Specimen: Nasopharyngeal Swab  Result Value Ref Range Status   SARS Coronavirus 2 by RT PCR NEGATIVE NEGATIVE Final    Comment: (NOTE) SARS-CoV-2 target nucleic acids are NOT DETECTED. The SARS-CoV-2 RNA is generally detectable in upper respiratoy specimens during the acute phase of infection. The lowest concentration of SARS-CoV-2 viral copies this assay can detect is 131 copies/mL. A negative result does not preclude SARS-Cov-2 infection and should not be used as the sole basis for treatment or other patient management decisions. A negative result may occur with  improper specimen collection/handling, submission of specimen other than  nasopharyngeal swab, presence of viral mutation(s) within the areas targeted by this assay, and inadequate number of viral copies (<131 copies/mL). A negative result must be combined with clinical observations, patient history, and epidemiological information. The expected result is Negative. Fact Sheet for Patients:  PinkCheek.be Fact Sheet for Healthcare Providers:  GravelBags.it This test is not yet ap proved or cleared by the [place] FDA and  has been authorized for detection and/or diagnosis of SARS-CoV-2 by FDA under an Emergency Use Authorization (EUA). This EUA will remain  in effect (meaning this test can be used) for the duration of the COVID-19 declaration under Section 564(b)(1) of the Act, 21 U.S.C. section 360bbb-3(b)(1), unless the authorization is terminated or revoked sooner.    Influenza A by PCR NEGATIVE NEGATIVE Final   Influenza B by PCR NEGATIVE NEGATIVE Final    Comment: (NOTE) The Xpert Xpress SARS-CoV-2/FLU/RSV assay is intended as an aid in  the diagnosis of influenza from Nasopharyngeal swab specimens and  should not be used as a sole basis for treatment. Nasal washings and  aspirates are unacceptable for Xpert Xpress SARS-CoV-2/FLU/RSV  testing. Fact Sheet for Patients: PinkCheek.be Fact Sheet for Healthcare Providers: GravelBags.it This test is not yet approved or cleared by the [place] FDA and  has been authorized for detection and/or diagnosis of SARS-CoV-2 by  FDA under an Emergency Use Authorization (EUA). This EUA will remain  in effect (meaning this test can be used) for the duration of the  Covid-19 declaration under Section 564(b)(1) of the Act, 21  U.S.C. section 360bbb-3(b)(1), unless the authorization is  terminated or revoked. Performed at [facility], [address] [place], [place] [address]    CULTURE, BLOOD (ROUTINE X 2) w Reflex to ID Panel     Status: Abnormal   Collection Time: 10/18/19 11:42 PM   Specimen: BLOOD  Result Value Ref Range Status   Specimen Description   Final    BLOOD BLOOD LEFT FOREARM Performed at [facility], [address], [place], [place] [address]    Special Requests   Final    BOTTLES DRAWN AEROBIC AND ANAEROBIC Blood Culture results may not be optimal due to an inadequate volume of blood received in culture bottles Performed at [facility], [address], [place], [place] [address]    Culture  Setup Time   Final    GRAM POSITIVE COCCI IN BOTH AEROBIC AND ANAEROBIC BOTTLES CRITICAL RESULT CALLED TO, READ BACK BY AND VERIFIED WITH: ABBY ELLINGTON AT [id number] 4826/21 SDR    Culture (A)  Final    STAPHYLOCOCCUS SPECIES (  COAGULASE NEGATIVE) THE SIGNIFICANCE OF ISOLATING THIS ORGANISM FROM A SINGLE SET OF BLOOD CULTURES WHEN MULTIPLE SETS ARE DRAWN IS UNCERTAIN. PLEASE NOTIFY THE MICROBIOLOGY DEPARTMENT WITHIN ONE WEEK IF SPECIATION AND SENSITIVITIES ARE REQUIRED. Performed at [facility], [place] [address], [place], [place] [address]    Report Status 10/22/2019 FINAL  Final  Blood Culture ID Panel (Reflexed)     Status: Abnormal   Collection Time: 10/18/19 11:42 PM  Result Value Ref Range Status   Enterococcus species NOT DETECTED NOT DETECTED Final   Listeria monocytogenes NOT DETECTED NOT DETECTED Final   Staphylococcus species DETECTED (A) NOT DETECTED Final    Comment: Methicillin (oxacillin) resistant coagulase negative staphylococcus. Possible blood culture contaminant (unless isolated from more than one blood culture draw or clinical case suggests pathogenicity). No antibiotic treatment is indicated for blood  culture contaminants. CRITICAL RESULT CALLED TO, READ BACK BY AND VERIFIED WITH:  ABBY ELLINGTON AT [id number] 10/19/19 SDR    Staphylococcus aureus (BCID) NOT DETECTED NOT DETECTED Final   Methicillin resistance  DETECTED (A) NOT DETECTED Final    Comment: CRITICAL RESULT CALLED TO, READ BACK BY AND VERIFIED WITH:  ABBY ELLINGTON AT [id number] 10/19/19 SDR    Streptococcus species NOT DETECTED NOT DETECTED Final   Streptococcus agalactiae NOT DETECTED NOT DETECTED Final   Streptococcus pneumoniae NOT DETECTED NOT DETECTED Final   Streptococcus pyogenes NOT DETECTED NOT DETECTED Final   Acinetobacter baumannii NOT DETECTED NOT DETECTED Final   Enterobacteriaceae species NOT DETECTED NOT DETECTED Final   Enterobacter cloacae complex NOT DETECTED NOT DETECTED Final   Escherichia coli NOT DETECTED NOT DETECTED Final   Klebsiella oxytoca NOT DETECTED NOT DETECTED Final   Klebsiella pneumoniae NOT DETECTED NOT DETECTED Final   Proteus species NOT DETECTED NOT DETECTED Final   Serratia marcescens NOT DETECTED NOT DETECTED Final   Haemophilus influenzae NOT DETECTED NOT DETECTED Final   Neisseria meningitidis NOT DETECTED NOT DETECTED Final   Pseudomonas aeruginosa NOT DETECTED NOT DETECTED Final   Candida albicans NOT DETECTED NOT DETECTED Final   Candida glabrata NOT DETECTED NOT DETECTED Final   Candida krusei NOT DETECTED NOT DETECTED Final   Candida parapsilosis NOT DETECTED NOT DETECTED Final   Candida tropicalis NOT DETECTED NOT DETECTED Final    Comment: Performed at [facility], [address] [place], [place] [address]  CULTURE, BLOOD (ROUTINE X 2) w Reflex to ID Panel     Status: None   Collection Time: 10/19/19  4:24 AM   Specimen: BLOOD  Result Value Ref Range Status   Specimen Description BLOOD BLOOD RIGHT WRIST  Final   Special Requests   Final    BOTTLES DRAWN AEROBIC AND ANAEROBIC Blood Culture results may not be optimal due to an excessive volume of blood received in culture bottles   Culture   Final    NO GROWTH 5 DAYS Performed at [facility], [address] [place], [place] [address]    Report Status 10/24/2019 FINAL  Final  MRSA PCR Screening     Status:  None   Collection Time: 10/19/19  2:00 PM   Specimen: Nasal Mucosa; Nasopharyngeal  Result Value Ref Range Status   MRSA by PCR NEGATIVE NEGATIVE Final    Comment:        The GeneXpert MRSA Assay (FDA approved for NASAL specimens only), is one component of a comprehensive MRSA colonization surveillance program. It is not intended to diagnose MRSA infection nor to guide or monitor  treatment for MRSA infections. Performed at [facility], [place], [place] [address]   SARS CORONAVIRUS 2 (TAT 6-24 HRS) Nasopharyngeal Nasopharyngeal Swab     Status: None   Collection Time: 10/25/19  5:05 PM   Specimen: Nasopharyngeal Swab  Result Value Ref Range Status   SARS Coronavirus 2 NEGATIVE NEGATIVE Final    Comment: (NOTE) SARS-CoV-2 target nucleic acids are NOT DETECTED. The SARS-CoV-2 RNA is generally detectable in upper and lower respiratory specimens during the acute phase of infection. Negative results do not preclude SARS-CoV-2 infection, do not rule out co-infections with other pathogens, and should not be used as the sole basis for treatment or other patient management decisions. Negative results must be combined with clinical observations, patient history, and epidemiological information. The expected result is Negative. Fact Sheet for Patients: SugarRoll.be Fact Sheet for Healthcare Providers: https://www.woods-mathews.com/ This test is not yet approved or cleared by the [place] FDA and  has been authorized for detection and/or diagnosis of SARS-CoV-2 by FDA under an Emergency Use Authorization (EUA). This EUA will remain  in effect (meaning this test can be used) for the duration of the COVID-19 declaration under Section 56 4(b)(1) of the Act, 21 U.S.C. section 360bbb-3(b)(1), unless the authorization is terminated or revoked sooner. Performed at [facility], [place] [address], [place],  [place] [address]      Labs: Basic Metabolic Panel: Recent Labs  Lab 10/22/19 0440 10/23/19 0537 10/24/19 0506 10/24/19 1744 10/25/19 0349  NA 126* 127* 126* 126* 130*  K 5.4* 5.0 5.1 5.7* 4.9  CL 77* 77* 75* 78* 81*  CO2 39* 41* 39* 34* 40*  GLUCOSE 90 113* 127* 174* 120*  BUN 50* 60* 74* 73* 71*  CREATININE 0.79 1.03* 1.10* 1.20* 0.95  CALCIUM 8.7* 8.8* 8.7* 8.8* 8.6*   Liver Function Tests: No results for input(s): AST, ALT, ALKPHOS, BILITOT, PROT, ALBUMIN in the last 168 hours. No results for input(s): LIPASE, AMYLASE in the last 168 hours. No results for input(s): AMMONIA in the last 168 hours. CBC: Recent Labs  Lab 10/22/19 0440 10/23/19 0537 10/24/19 0506 10/25/19 0349 10/26/19 0325  WBC 8.8 9.9 11.2* 10.9* 10.5  NEUTROABS 6.0 6.7 7.9* 7.7 8.0*  HGB 10.3* 10.4* 10.0* 10.7* 10.6*  HCT 32.3* 31.2* 30.6* 32.7* 32.4*  MCV 89.5 87.9 88.7 88.4 89.3  PLT 233 253 255 264 282   Cardiac Enzymes: No results for input(s): CKTOTAL, CKMB, CKMBINDEX, TROPONINI in the last 168 hours. BNP: BNP (last 3 results) Recent Labs    09/22/19 1535 10/01/19 1519 10/18/19 2002  BNP 624.0* 800.0* 580.0*    ProBNP (last 3 results) No results for input(s): PROBNP in the last 8760 hours.  CBG: Recent Labs  Lab 10/25/19 1159 10/25/19 1633 10/25/19 2101 10/26/19 0733 10/26/19 0807  GLUCAP 117* 170* 187* 96 92    Active Problems:   Chronic hyponatremia   Essential hypertension   GERD   Schizoaffective disorder, bipolar type (HCC)   COPD exacerbation (HCC)   Acute on chronic respiratory failure with hypoxia and hypercapnia (HCC)   Anemia   Anxiety and depression   Irritability   Aggressive behavior   Agitation   Type 2 diabetes mellitus without complication (HCC)   Acute on chronic combined systolic and diastolic congestive heart failure (HCC)   Hyponatremia   Acute on chronic respiratory failure with hypoxemia (HCC)   Class 2 obesity with body mass index (BMI) of 39.0  to 39.9 in adult  Acute respiratory failure ([place])  Time coordinating discharge: 38 minutes.  Signed:        Ava Swayze, DO [facility]  10/26/2019, 9:09 AM

## 2019-10-26 NOTE — Care Management Important Message (Signed)
Important Message  Patient Details  Name: JULIE-ANN STOCKBRIDGE MRN: [id number] Date of Birth: 1960/11/07   Medicare Important Message Given:  Yes     Juliann Pulse A Jeffrie Lofstrom 10/26/2019, 11:14 AM

## 2019-10-26 NOTE — Progress Notes (Signed)
SUBJECTIVE: Patient resting comfortably. Denies shortness of breath or chest pain.   Vitals:   10/25/19 2352 10/26/19 0500 10/26/19 0729 10/26/19 0802  BP: 102/76  (!) 128/97   Pulse: 64  (!) 55   Resp: 17     Temp: 98.3 F (36.8 C)  (!) 97.5 F (36.4 C)   TempSrc: Oral  Oral   SpO2: 95%  99% 96%  Weight:  102.6 kg    Height:        Intake/Output Summary (Last 24 hours) at 10/26/2019 1025 Last data filed at 10/26/2019 1014 Gross per 24 hour  Intake 2795.61 ml  Output 4200 ml  Net -1404.39 ml    LABS: Basic Metabolic Panel: Recent Labs    10/24/19 1744 10/25/19 0349  NA 126* 130*  K 5.7* 4.9  CL 78* 81*  CO2 34* 40*  GLUCOSE 174* 120*  BUN 73* 71*  CREATININE 1.20* 0.95  CALCIUM 8.8* 8.6*   Liver Function Tests: No results for input(s): AST, ALT, ALKPHOS, BILITOT, PROT, ALBUMIN in the last 72 hours. No results for input(s): LIPASE, AMYLASE in the last 72 hours. CBC: Recent Labs    10/25/19 0349 10/26/19 0325  WBC 10.9* 10.5  NEUTROABS 7.7 8.0*  HGB 10.7* 10.6*  HCT 32.7* 32.4*  MCV 88.4 89.3  PLT 264 282   Cardiac Enzymes: No results for input(s): CKTOTAL, CKMB, CKMBINDEX, TROPONINI in the last 72 hours. BNP: Invalid input(s): POCBNP D-Dimer: No results for input(s): DDIMER in the last 72 hours. Hemoglobin A1C: No results for input(s): HGBA1C in the last 72 hours. Fasting Lipid Panel: No results for input(s): CHOL, HDL, LDLCALC, TRIG, CHOLHDL, LDLDIRECT in the last 72 hours. Thyroid Function Tests: No results for input(s): TSH, T4TOTAL, T3FREE, THYROIDAB in the last 72 hours.  Invalid input(s): FREET3 Anemia Panel: No results for input(s): VITAMINB12, FOLATE, FERRITIN, TIBC, IRON, RETICCTPCT in the last 72 hours.   PHYSICAL EXAM General: Well developed, well nourished, in no acute distress HEENT:  Normocephalic and atramatic Neck:  No JVD.  Lungs: Scattered expiratory wheezes. Heart: HRRR . Normal S1 and S2 without gallops or murmurs.   Abdomen: Bowel sounds are positive, abdomen soft and non-tender  Msk:  Back normal, normal gait. Normal strength and tone for age. Extremities: No clubbing, cyanosis or edema.   Neuro: Alert and oriented X 3. Psych:  Good affect, responds appropriately  TELEMETRY: NSR 82/bpm  ASSESSMENT AND PLAN: Patient presenting to the emergency department in respiratory distress requiring BiPAP. Patient'soxygen requirementhas decreased back to 4 L nasal cannulaand wheezing continues to improve Echocardiogram much improved from last month.HTN controlled. Please continue current regimen and maintain current lasix dosing. Continue to hold spironolactone as patient recently hyperkalemic to 5.7. From a cardiac stand point patient is stable for discharge as she is back to her baseline. Patient has an appt at our office 5/6 1015AM for continued HFrEF / HFpEF management. Thank you for involving [place] in her care.   Active Problems:   Chronic hyponatremia   Essential hypertension   GERD   Schizoaffective disorder, bipolar type (HCC)   COPD exacerbation (HCC)   Acute on chronic respiratory failure with hypoxia and hypercapnia (HCC)   Anemia   Anxiety and depression   Irritability   Aggressive behavior   Agitation   Type 2 diabetes mellitus without complication (HCC)   Acute on chronic combined systolic and diastolic congestive heart failure (HCC)   Hyponatremia   Acute on chronic respiratory failure with hypoxemia ([place])  Class 2 obesity with body mass index (BMI) of 39.0 to 39.9 in adult   Acute respiratory failure ([place])    Adaline Sill, NP-C  10/26/2019 10:25 AM

## 2019-10-27 ENCOUNTER — Telehealth: Payer: Self-pay | Admitting: Family

## 2019-10-27 NOTE — Telephone Encounter (Signed)
LVM 5/3 & 5/4 to confirm patients transportation to appointment with [facility].

## 2019-10-29 NOTE — Progress Notes (Deleted)
   Patient ID: Natalie Thomas, female    DOB: 22-Jan-1961, 59 y.o.   MRN: [id number]  HPI  Ms Dumas is a 59 y/o female with a history of  Echo report from 10/21/19 reviewed and showed an EF of 50-55% along with mild/moderate MR.  Admitted 10/18/19 due to acute on chronic HF. Cardiology consult obtained. Initially given CPAP then moved to bipap and then transitioned off. Initially given IV lasix and then transitioned to oral diuretics. Discharged after 8 days. Admitted 10/01/19 due to acute on chronic HF. Psychiatry and palliative care consults obtained. Unable to tolerate CPAP. Initially given IV lasix and then transitioned to oral diuretics. Advised to decrease fluid consumption. Discharged after 4 days.   She presents today for his initial visit with a chief complaint of  Review of Systems    Physical Exam   Assessment & Plan:  1: Chronic heart failure with preserved ejection fraction without structural changes- - NYHA class - BNP 10/18/19 was 580.0  2: HTN- - BP - currently seeing PCP at facility - [facility] 10/25/19 reviewed and showed sodium 130, potassium 4.9, creatinine 0.95 and GFR >60  3: DM- - A1c 10/23/19 was 5.6%  4: Schizophrenia-

## 2019-10-30 ENCOUNTER — Ambulatory Visit: Payer: Medicare Other | Admitting: Family

## 2019-11-06 ENCOUNTER — Ambulatory Visit: Payer: Medicare Other | Admitting: Family

## 2019-11-06 NOTE — Progress Notes (Deleted)
   Patient ID: Natalie Thomas, female    DOB: [birth year], 59 y.o.   MRN: [id number]  HPI  Natalie Thomas is a 59 y/o female with a history of  Echo report from 10/21/19 reviewed and showed an EF of 50-55% along with mild/moderate MR.  Admitted 10/18/19 due to acute on chronic HF. Cardiology consult obtained. Initially given CPAP then moved to bipap and then transitioned off. Initially given IV lasix and then transitioned to oral diuretics. Discharged after 8 days. Admitted 10/01/19 due to acute on chronic HF. Psychiatry and palliative care consults obtained. Unable to tolerate CPAP. Initially given IV lasix and then transitioned to oral diuretics. Advised to decrease fluid consumption. Discharged after 4 days.   She presents today for his initial visit with a chief complaint of  Review of Systems    Physical Exam   Assessment & Plan:  1: Chronic heart failure with preserved ejection fraction without structural changes- - NYHA class - BNP 10/18/19 was 580.0  2: HTN- - BP - currently seeing PCP at facility - [facility] 10/25/19 reviewed and showed sodium 130, potassium 4.9, creatinine 0.95 and GFR >60  3: DM- - A1c 10/23/19 was 5.6%  4: Schizophrenia-

## 2019-11-18 ENCOUNTER — Non-Acute Institutional Stay: Payer: Medicare Other | Admitting: Nurse Practitioner

## 2019-11-18 ENCOUNTER — Encounter: Payer: Self-pay | Admitting: Nurse Practitioner

## 2019-11-18 DIAGNOSIS — I509 Heart failure, unspecified: Secondary | ICD-10-CM

## 2019-11-18 DIAGNOSIS — Z515 Encounter for palliative care: Secondary | ICD-10-CM

## 2019-11-18 NOTE — Progress Notes (Signed)
[facility] Palliative Care Consult Note Telephone: [phone]  Fax: [phone]  PATIENT NAME: Natalie Thomas DOB: Nov 27, 1960 MRN: [id number]   PRIMARY CARE PROVIDER:  Dr Yves Dill PROVIDER:  Dr Hodges/[facility] RESPONSIBLE PARTY:   [name] Guardian [phone], [phone]  RECOMMENDATIONS and PLAN: 1.ACP: DNR in place  2.Palliative care encounter; Palliative medicine team will continue to support patient, patient's family, and medical team. Visit consisted of counseling and education dealing with the complex and emotionally intense issues of symptom management and palliative care in the setting of serious and potentially life-threatening illness  I spent 60 minutes providing this consultation,  Starting at 9:30am. More than 50% of the time in this consultation was spent coordinating communication.   HISTORY OF PRESENT ILLNESS:  Natalie Thomas is a 59 y.o. year old female with multiple medical problems including Malignant neoplasm volvulus, polycythemia, diastolic congestive heart failure, history of pleural effusion, chronic respiratory failure with hypoxia and hypercapnia, anemia, COPD, hypertension, diabetes, chronic hyponatremia, hyperlipidemia, gerd, lumbar disc disease, osteoporosis, schizoaffective disorder bipolar type, anxiety, depression, right intramedullary nail intertrochanteric, pelvic surgery for fracture, eye surgery.Hospitalized for 10/2019 to 5 / 3 / 2021 acute on chronic hypoxic respiratory failure, acute on chronic congestive heart failure, hyperkalemia, diabetes, COPD exacerbation. She is on continuous oxygen did require CPAP with Improvement in the emergency department. BNP 800 and receive Lasix. Discharge back to [facility] where she currently resides long-term. She is told by Psychiatry was last date of service 5 / 20 / 2021 for anxiety for what she is prescribed clonazepam and  hydroxyzine. Hydroxyzine was renewed at this visit. Natalie Thomas continues to reside at [place] [facility]. Natalie Thomas require assistance for transferring, mobility, adl's. Natalie Thomas is sitting in the wheelchair. Natalie Thomas does feed herself and appetite has improved. Natalie Thomas does continue to wear oxygen. Natalie Thomas has completed therapy with improved strength. At present misfires is sitting in the wheelchair in the hall. Natalie Thomas endorses she is doing much better. We talked about purpose of palliative care visit. Natalie Thomas in agreement. We talked about how she was feeling today. Natalie Thomas endorses she does continue to get some shortness of breath with exertion but improves with rest. Natalie Thomas denies pain. We talked about appetite. Natalie Thomas endorses she has been improving since hospitalization. We talked about her functional abilities. We talked about the importance of being out of bed, self Independence as possible. Talked about challenges regarding its skilled facility. Review medical goals as under DSS guardianship. The [place] does seem to be improving at present time. Discussed will continue to follow monitor with palliative care until further discussion of goals of care with DSS. I updated nursing staff no new changes at present times symptoms currently controlled the chronic disease progression, and increase risk of re-hospitalization with comorbidities.  Palliative Care was asked to help to continue to address goals of care.   CODE STATUS: DNR  PPS: 50% HOSPICE ELIGIBILITY/DIAGNOSIS: TBD  PAST MEDICAL HISTORY:  Past Medical History:  Diagnosis Date  . Anemia 10/16/2017  . Anxiety and depression   . CHF (congestive heart failure) ([place])   . Chronic hyponatremia 04/08/2007   Qualifier: Diagnosis of  By: Marca Ancona RMA, Lucy    . Closed comminuted intertrochanteric fracture of right femur ([place]) 11/07/2017  . COPD 04/08/2007   Qualifier: Diagnosis  of  By: Marca Ancona RMA, Lucy    .  Depression   . Diabetes mellitus, type 2 ([place])    pt denies but states that she has been treated for DM  . Essential hypertension 04/08/2007   Qualifier: Diagnosis of  By: Marca Ancona RMA, Lucy    . GERD (gastroesophageal reflux disease)   . Hyperlipidemia 10/07/2017  . Lumbar disc disease 04/09/2011  . MENOPAUSE, PREMATURE 04/08/2007   Qualifier: Diagnosis of  By: Marca Ancona RMA, Lucy    . 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 Osteoporosis 04/08/2007   Qualifier: Diagnosis of  By: Reatha Armour, Lucy    . Polycythemia secondary to smoking 04/09/2011  . 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 Schizoaffective disorder, bipolar type ([place]) 07/05/2011    SOCIAL HX:  Social History   Tobacco Use  . Smoking status: Former Smoker    Packs/day: 0.25    Types: Cigarettes    Quit date: 07/23/2019    Years since quitting: 0.3  . Smokeless tobacco: Never Used  Substance Use Topics  . Alcohol use: No    ALLERGIES:  Allergies  Allergen Reactions  . Clarithromycin Itching  . Penicillins Itching    Has tolerated cefazolin before  Has patient had a PCN reaction causing immediate rash, facial/tongue/throat swelling, SOB or lightheadedness with hypotension: No Has patient had a PCN reaction causing severe rash involving mucus membranes or skin necrosis: No Has patient had a PCN reaction that required hospitalization: Unknown Has patient had a PCN reaction occurring within the last 10 years: No If all of the above answers are "NO", then may proceed with Cephalosporin use.      PERTINENT MEDICATIONS:  Outpatient Encounter Medications as of 11/18/2019  Medication Sig  . acetaminophen (TYLENOL) 500 MG tablet Take 1,000 mg by mouth every 8 (eight) hours as needed for mild pain or moderate pain. Not to exceed 3g/24h of tylenol from all sources.  Marland Kitchen albuterol (PROVENTIL) (2.5 MG/3ML) 0.083% nebulizer solution Take 3 mLs (2.5 mg total)  by nebulization every 4 (four) hours as needed for wheezing or shortness of breath.  Marland Kitchen albuterol (VENTOLIN HFA) 108 (90 Base) MCG/ACT inhaler Inhale 2 puffs into the lungs every 6 (six) hours as needed for wheezing or shortness of breath.  . Alum & Mag Hydroxide-Simeth (ANTACID LIQUID PO) Take 30 mLs by mouth every 6 (six) hours as needed (heartburn).  Marland Kitchen ascorbic acid (VITAMIN C) 1000 MG tablet Take 1 tablet (1,000 mg total) by mouth daily.  Marland Kitchen aspirin EC 81 MG EC tablet Take 1 tablet (81 mg total) by mouth daily.  Marland Kitchen buPROPion (WELLBUTRIN XL) 150 MG 24 hr tablet Take 150 mg by mouth daily.  . Calcium Carb-Cholecalciferol 600-400 MG-UNIT TABS Take 1 tablet by mouth daily.  . chlorthalidone (HYGROTON) 50 MG tablet Take 1 tablet (50 mg total) by mouth daily.  . clonazePAM (KLONOPIN) 1 MG tablet Take 0.5 tablets (0.5 mg total) by mouth 2 (two) times daily.  Marland Kitchen docusate sodium (COLACE) 100 MG capsule Take 100 mg by mouth 2 (two) times daily.   . feeding supplement, ENSURE ENLIVE, (ENSURE ENLIVE) LIQD Take 237 mLs by mouth daily.  . fluticasone furoate-vilanterol (BREO ELLIPTA) 100-25 MCG/INH AEPB Inhale 1 puff into the lungs daily.  . furosemide (LASIX) 20 MG tablet Take 3 tablets (60 mg total) by mouth 2 (two) times daily.  . hydrOXYzine (ATARAX/VISTARIL) 25 MG tablet Take 25 mg by mouth every 6 (six) hours as needed for anxiety or itching.  Marland Kitchen ipratropium-albuterol (  DUONEB) 0.5-2.5 (3) MG/3ML SOLN Take 3 mLs by nebulization 3 (three) times daily.  Marland Kitchen lisinopril (ZESTRIL) 10 MG tablet Take 1 tablet (10 mg total) by mouth daily.  Marland Kitchen loratadine (CLARITIN) 10 MG tablet Take 10 mg by mouth daily.  Marland Kitchen lurasidone (LATUDA) 20 MG TABS tablet Take 1 tablet (20 mg total) by mouth 2 (two) times daily.  . magnesium hydroxide (MILK OF MAGNESIA) 400 MG/5ML suspension Take 30 mLs by mouth daily as needed for mild constipation or moderate constipation.  . Melatonin 5 MG TABS Take 5 mg by mouth at bedtime.  . metoprolol  succinate (TOPROL-XL) 25 MG 24 hr tablet Take 0.5 tablets (12.5 mg total) by mouth daily.  . Multiple Vitamin (MULTIVITAMIN WITH MINERALS) TABS tablet Take 1 tablet by mouth daily.  Marland Kitchen neomycin-bacitracin-polymyxin (NEOSPORIN) ointment Apply 1 application topically as needed for wound care.  . nicotine (NICODERM CQ - DOSED IN MG/24 HOURS) 21 mg/24hr patch Place 21 mg onto the skin daily.   Marland Kitchen NIFEdipine (ADALAT CC) 60 MG 24 hr tablet Take 1 tablet (60 mg total) by mouth daily.  Marland Kitchen nystatin (NYSTATIN) powder Apply 1 application topically 2 (two) times daily as needed (irritation). Apply topically to reddened area twice daily as needed for irritation  . OLANZapine (ZYPREXA) 10 MG tablet Take 10 mg by mouth 2 (two) times daily.  . ondansetron (ZOFRAN) 8 MG tablet Take 8 mg by mouth every 6 (six) hours as needed for nausea or vomiting.  . pantoprazole (PROTONIX) 40 MG tablet Take 1 tablet (40 mg total) by mouth daily.  . polyethylene glycol (MIRALAX) 17 g packet Take 17 g by mouth daily. (Patient taking differently: Take 17 g by mouth daily. Mix 1 capful (17 g) in 4-8 oz of water or juice and give by mouth once daily for bowel regimen)  . sodium chloride (OCEAN) 0.65 % SOLN nasal spray Place 2 sprays into both nostrils every 2 (two) hours as needed (dryness, irritation).  . sucralfate (CARAFATE) 1 GM/10ML suspension Take 10 mLs (1 g total) by mouth 4 (four) times daily -  with meals and at bedtime.  . valproic acid (DEPAKENE) 250 MG capsule Take 2 capsules (500 mg total) by mouth 3 (three) times daily.   No facility-administered encounter medications on file as of 11/18/2019.    PHYSICAL EXAM:   General: obese, chronically ill, w/c dependent female Cardiovascular: regular rate and rhythm Pulmonary: clear ant fields Extremities: mild BLE edema, no joint deformities Neurological: w/c dependent  Teosha Casso Ihor Gully, NP

## 2019-11-19 ENCOUNTER — Other Ambulatory Visit: Payer: Self-pay

## 2019-12-09 IMAGING — DX PORTABLE CHEST - 1 VIEW
1 series · 1 of 1 positions shown · non-contrast
Comparison: November 08, 2018

CLINICAL DATA: Hypoxia

EXAM:
PORTABLE CHEST 1 VIEW

[chest ap]
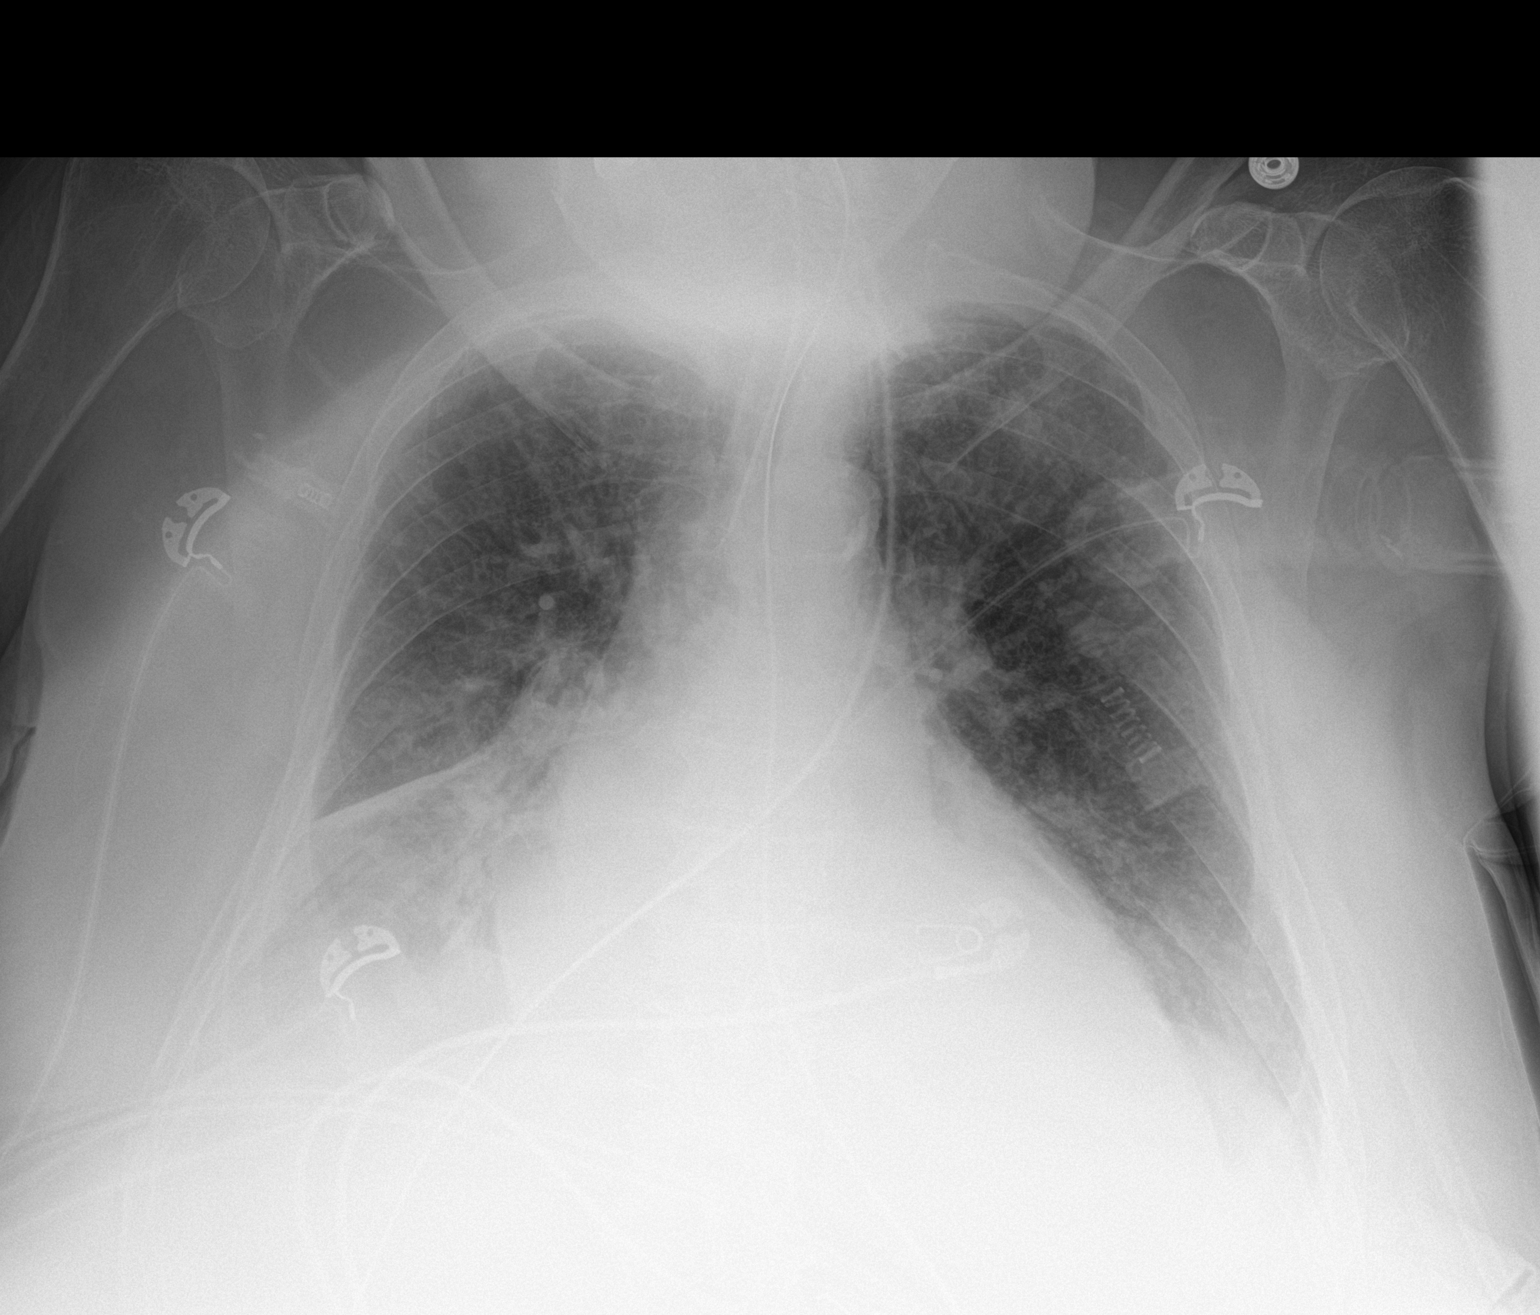

[1 of 1 positions shown; findings below may reference images not displayed]

FINDINGS: Endotracheal tube tip is 1.2 cm above the carina. Nasogastric tube
tip and side port are below the diaphragm. No pneumothorax. There is
pleural effusion with airspace consolidation in much of the right
middle and lower lobes. There is consolidation in the medial left
base. There is cardiomegaly with pulmonary vascularity normal. There
is aortic atherosclerosis. No adenopathy. No bone lesions.
IMPRESSION: Tube positions as described without pneumothorax. Note that the
endotracheal tube tip is near the carina; it may be prudent to
consider withdrawing endotracheal tube 2 to 2.5 cm.

Right pleural effusion with consolidation throughout much of the
right middle and lower lobes. There is also consolidation in the
medial left base. There is stable cardiomegaly. Aortic
Atherosclerosis ([id number]).

## 2019-12-09 IMAGING — DX PORTABLE CHEST - 1 VIEW
2 series · 2 of 2 positions shown · non-contrast
Comparison: Single-view of the chest 11/08/2018. PA and lateral
chest 07/04/2018 and 06/09/2018. CT chest 05/14/2018.

CLINICAL DATA: Oxygen desaturation.

EXAM:
PORTABLE CHEST 1 VIEW

[chest ap]
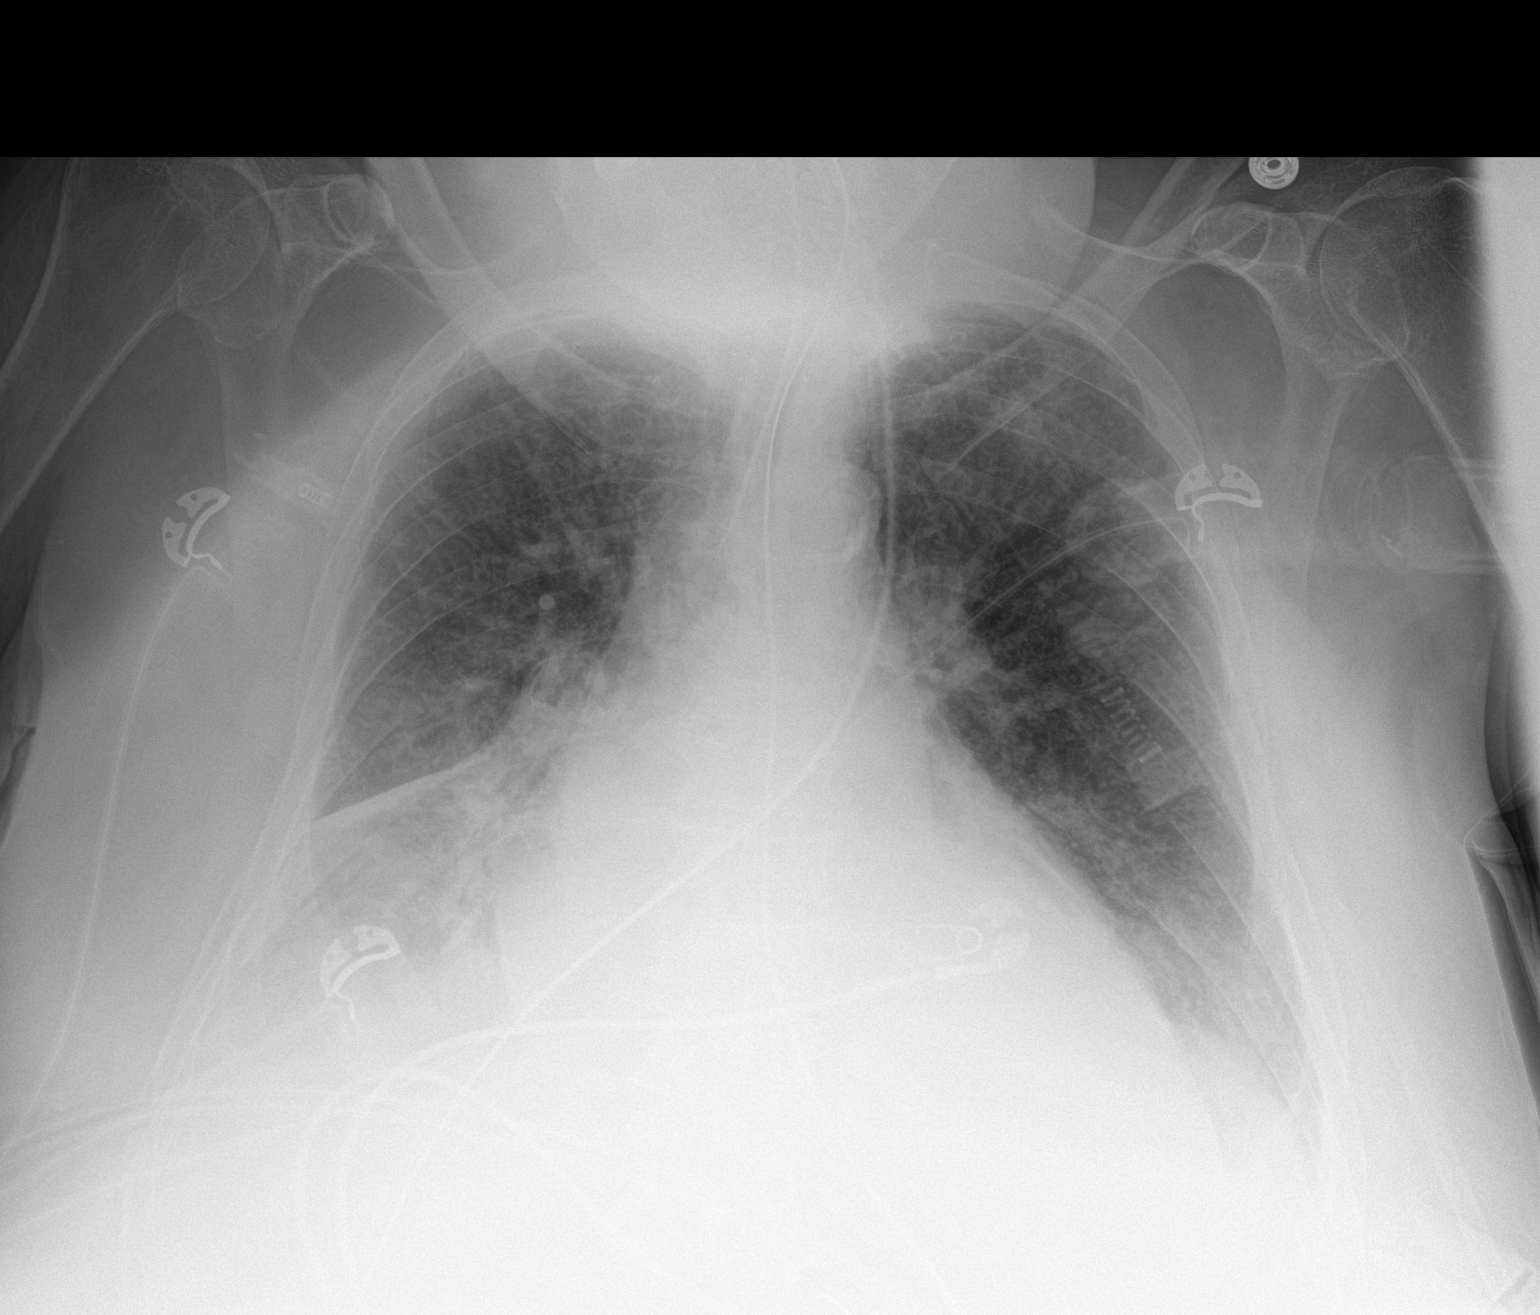

[chest]
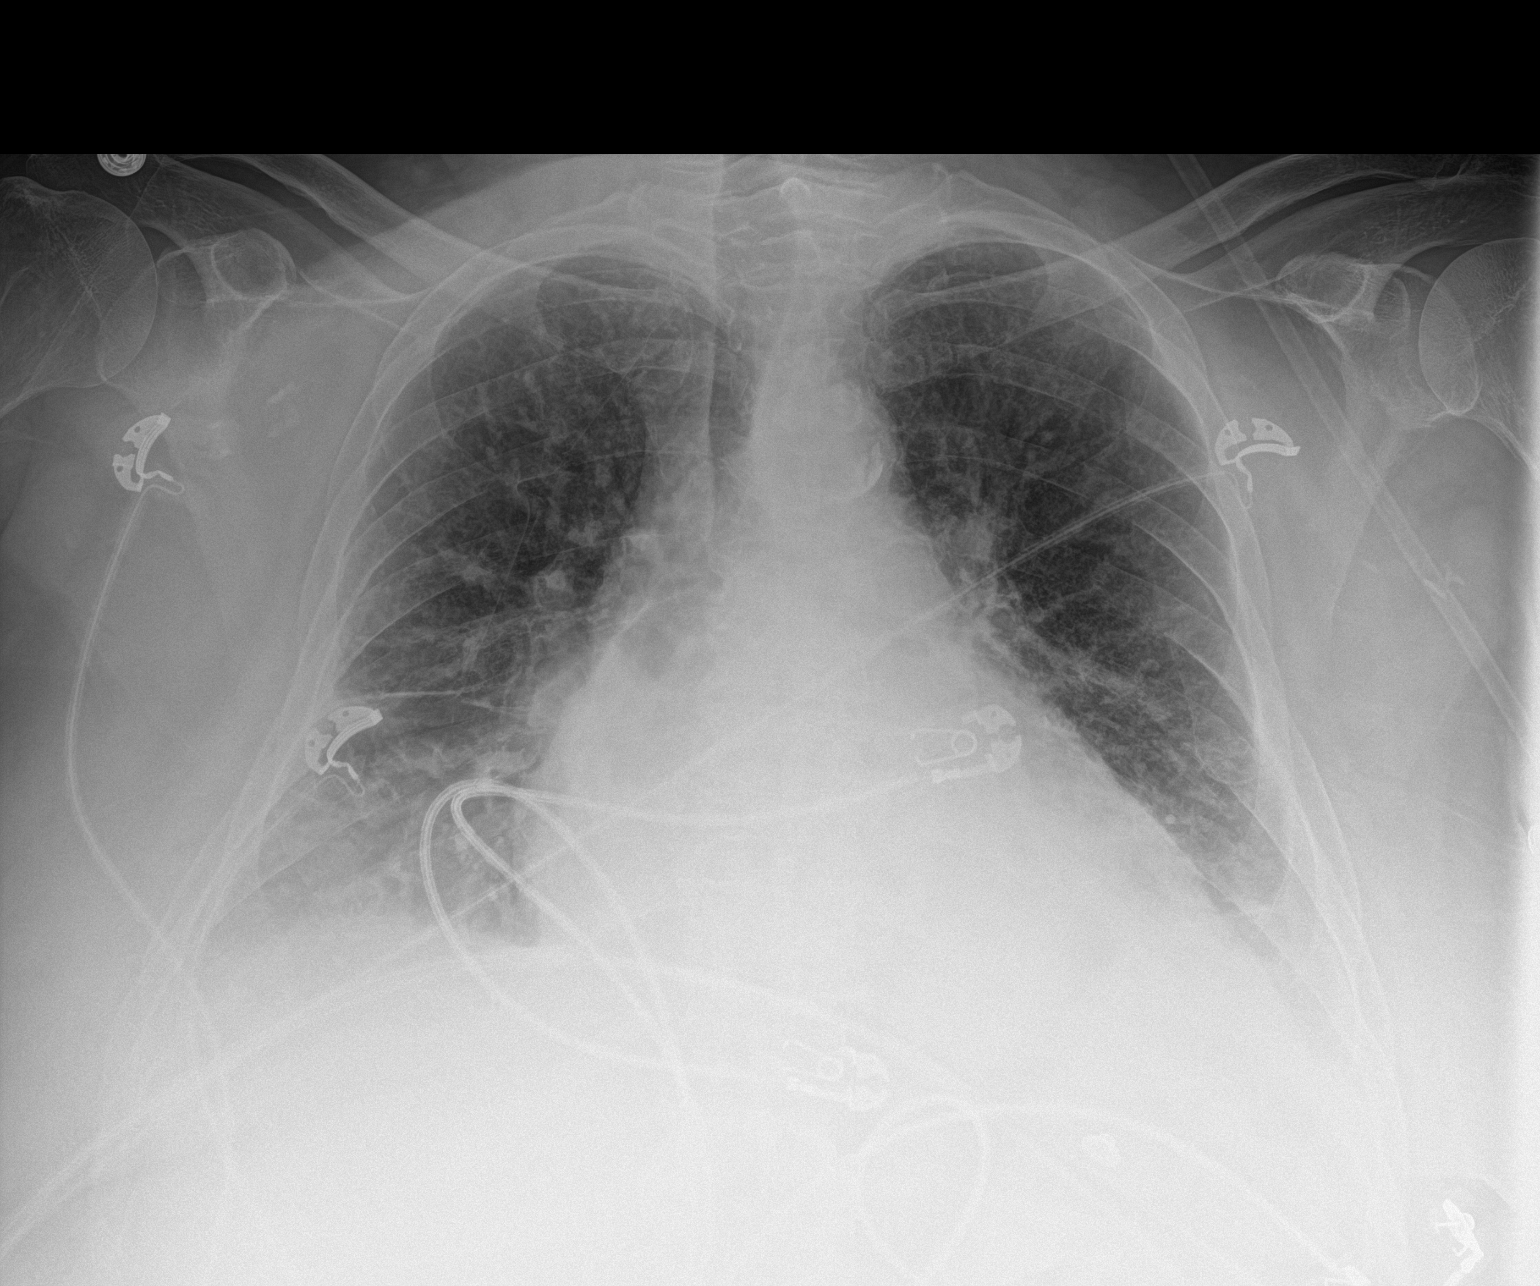

[2 of 2 positions shown; findings below may reference images not displayed]

FINDINGS: The lungs are emphysematous. There is marked cardiomegaly. Mild
interstitial edema is present. Hazy opacities in the lung bases
likely represent small effusions. No pneumothorax. Atherosclerosis
noted. No acute bony abnormality.
IMPRESSION: Cardiomegaly with mild interstitial edema and small pleural
effusions.

Atherosclerosis.

Emphysema.

## 2019-12-10 IMAGING — DX PORTABLE CHEST - 1 VIEW
1 series · 1 of 1 positions shown · non-contrast
Comparison: 12/03/2018

CLINICAL DATA: respiratory failurerespiratory failure

EXAM:
PORTABLE CHEST 1 VIEW

[chest ap]
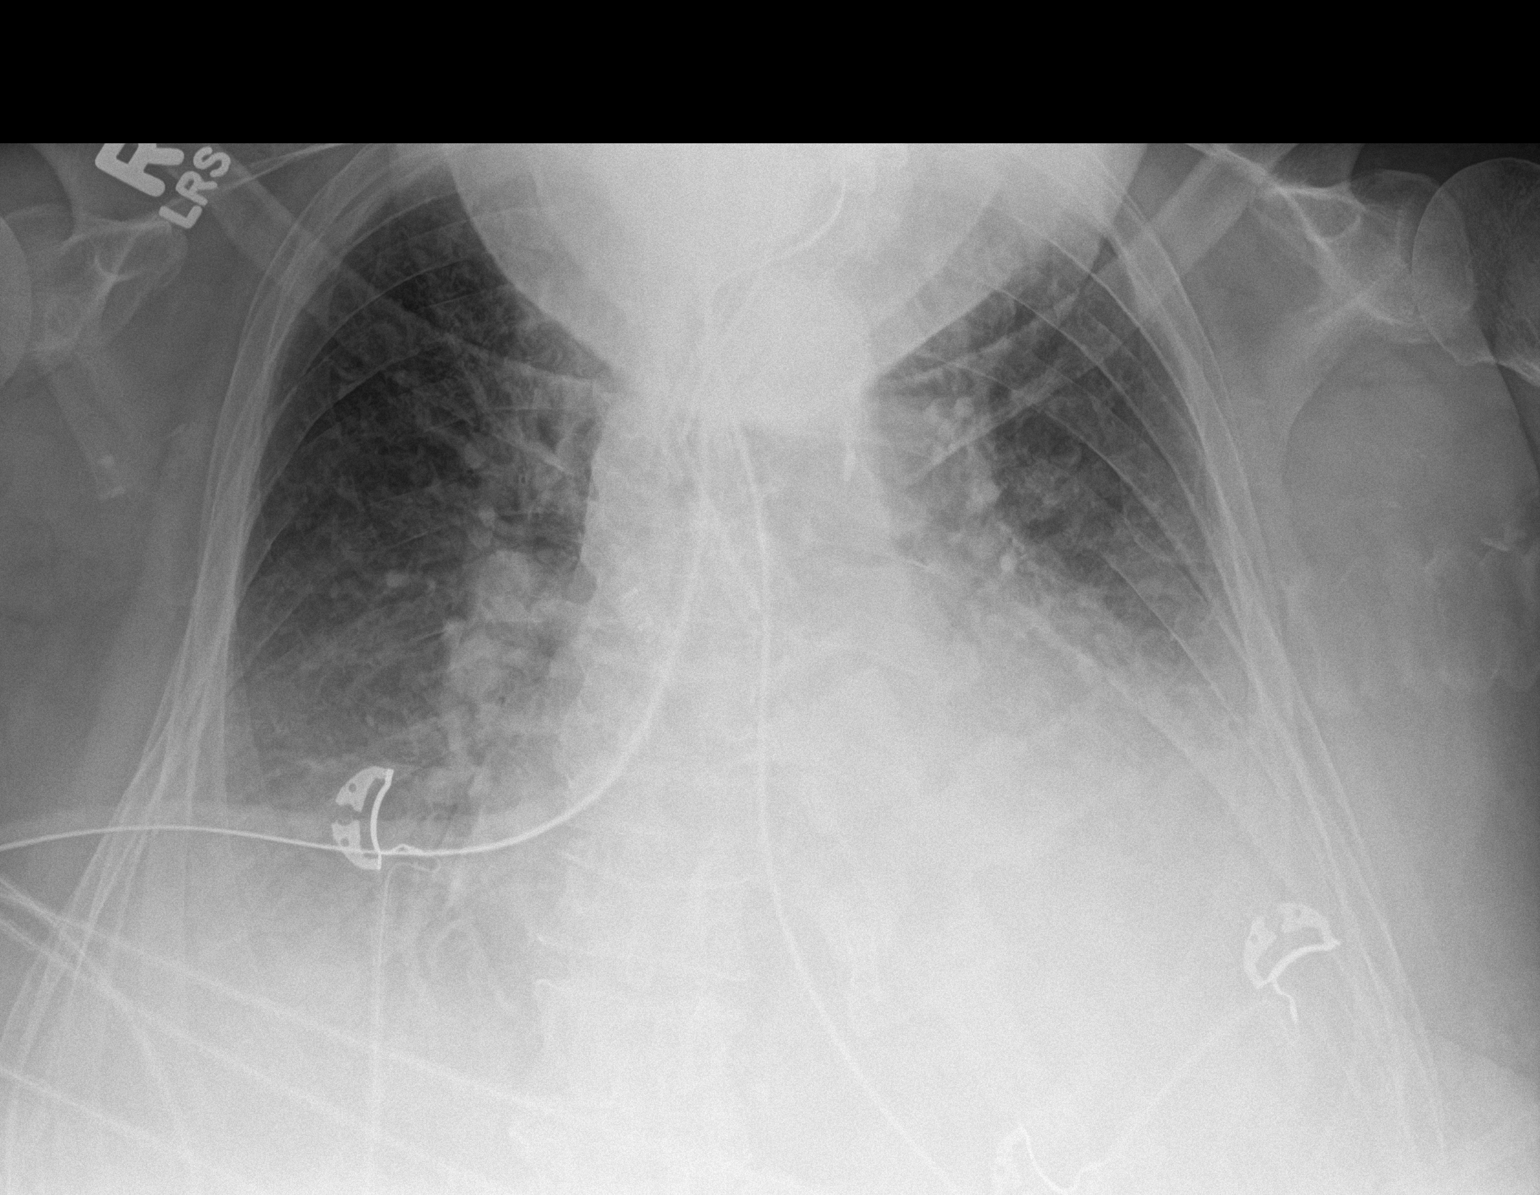

[1 of 1 positions shown; findings below may reference images not displayed]

FINDINGS: Endotracheal tube appears in good position. NG tube extends into the
stomach. Stable cardiomegaly. There is bibasilar atelectasis and
bibasilar pleural effusions. Upper lungs are clear.
IMPRESSION: 1. No significant change.
2. Stable support apparatus.
3. bilateral pleural effusions and bibasilar atelectasis

## 2019-12-11 IMAGING — DX PORTABLE CHEST - 1 VIEW
1 series · 1 of 1 positions shown · non-contrast
Comparison: 12/04/2018

CLINICAL DATA: Acute respiratory failure

EXAM:
PORTABLE CHEST 1 VIEW

[chest ap]
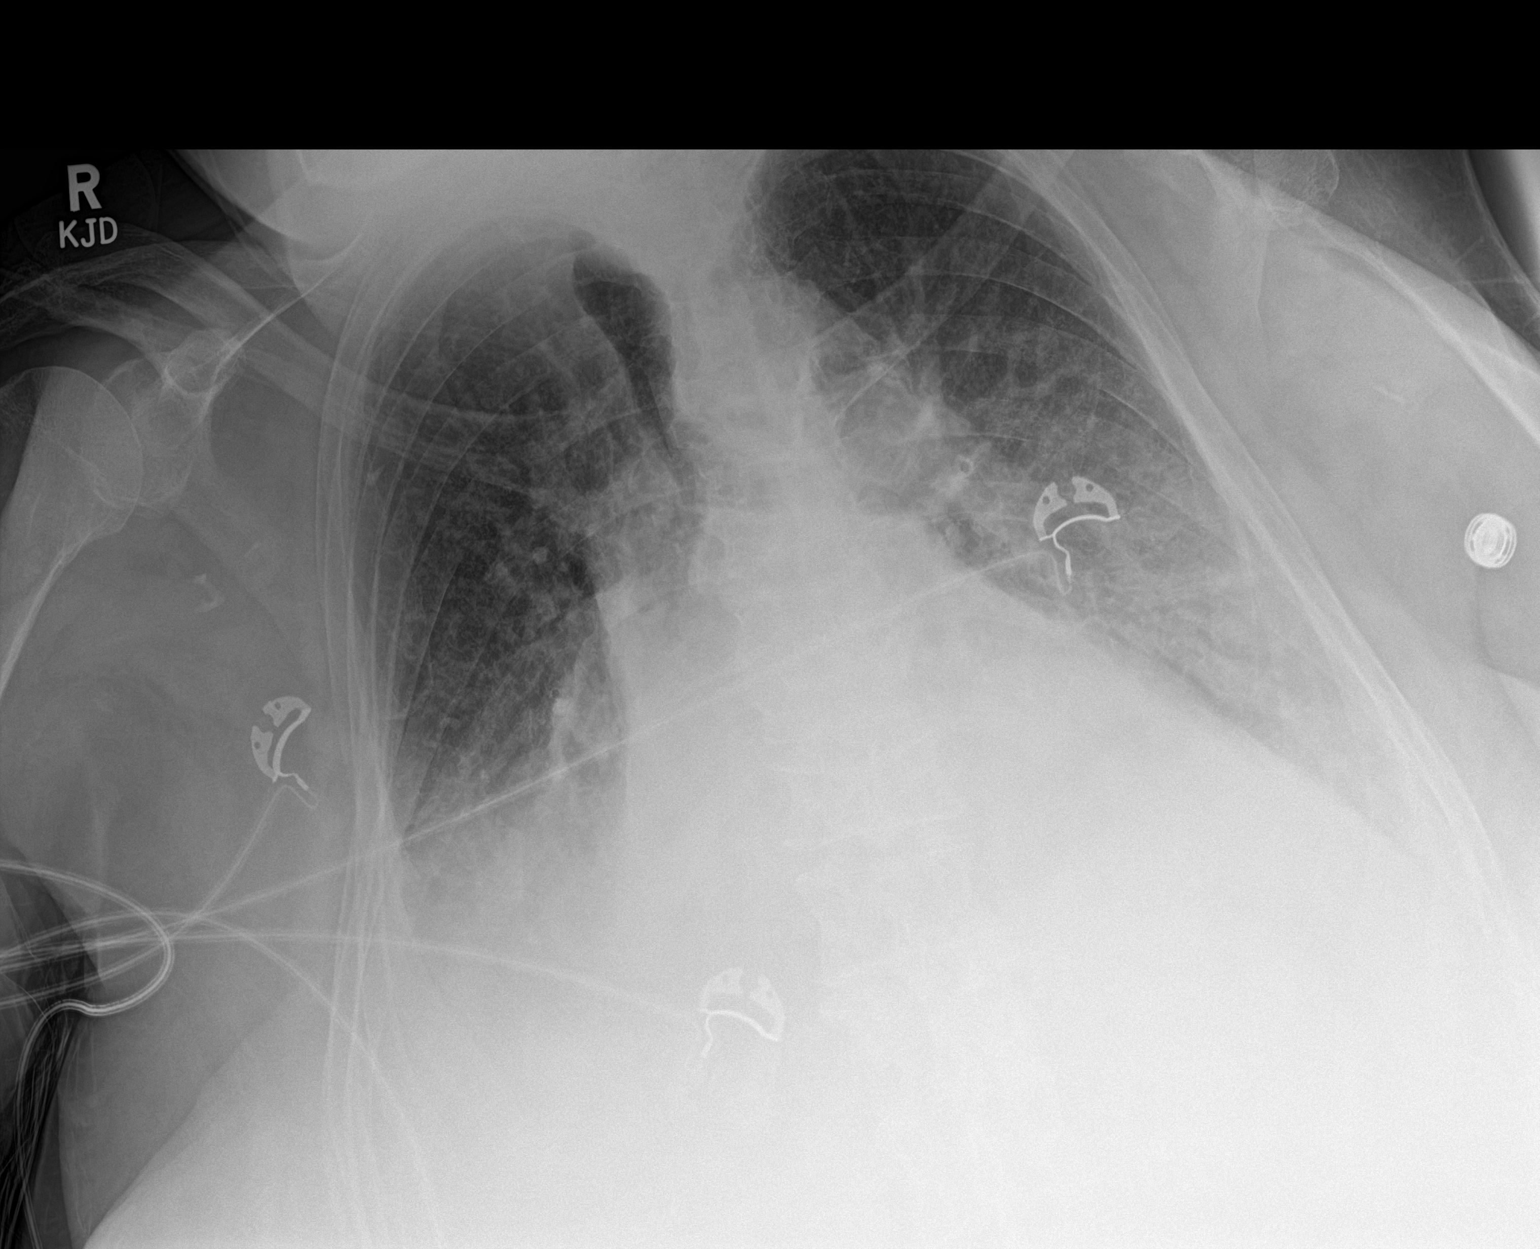

[1 of 1 positions shown; findings below may reference images not displayed]

FINDINGS: Cardiac shadow is enlarged but stable. Aortic calcifications are
again seen. Bilateral pleural effusions with underlying atelectatic
changes are again seen and stable. Endotracheal tube and gastric
catheter have been removed in the interval.
IMPRESSION: Stable effusions and atelectatic changes. No new focal abnormality
is noted.

## 2019-12-14 ENCOUNTER — Other Ambulatory Visit: Payer: Self-pay | Admitting: Internal Medicine

## 2019-12-14 DIAGNOSIS — E871 Hypo-osmolality and hyponatremia: Secondary | ICD-10-CM

## 2019-12-23 ENCOUNTER — Encounter: Payer: Self-pay | Admitting: Nurse Practitioner

## 2019-12-23 ENCOUNTER — Other Ambulatory Visit: Payer: Self-pay

## 2019-12-23 ENCOUNTER — Non-Acute Institutional Stay: Payer: Medicare Other | Admitting: Nurse Practitioner

## 2019-12-23 VITALS — BP 141/50 | HR 85 | Temp 97.9°F | Wt 211.0 lb

## 2019-12-23 DIAGNOSIS — I509 Heart failure, unspecified: Secondary | ICD-10-CM

## 2019-12-23 DIAGNOSIS — Z515 Encounter for palliative care: Secondary | ICD-10-CM

## 2019-12-23 NOTE — Progress Notes (Signed)
[facility] Consult Note Telephone: [phone]  Fax: [phone]  PATIENT NAME: Natalie Thomas DOB: 08/23/1960 MRN: [id number]  PRIMARY CARE PROVIDER:Dr Yves Dill PROVIDER:Dr Hodges/[place] [phone], [phone]  RECOMMENDATIONS and PLAN: 1.ACP:DNR in place  2.Palliative care encounter; Palliative medicine team will continue to support patient, patient's family, and medical team. Visit consisted of counseling and education dealing with the complex and emotionally intense issues of symptom management and palliative care in the setting of serious and potentially life-threatening illness  I spent 60 minutes providing this consultation,  Start at 12:30pm. More than 50% of the time in this consultation was spent coordinating communication.   HISTORY OF PRESENT ILLNESS:  Natalie Thomas is a 59 y.o. year old female with multiple medical problems including Malignant neoplasm volvulus, polycythemia, diastolic congestive heart failure, history of pleural effusion, chronic respiratory failure with hypoxia and hypercapnia, anemia, COPD, hypertension, diabetes, chronic hyponatremia, hyperlipidemia, gerd, lumbar disc disease, osteoporosis, schizoaffective disorder bipolar type, anxiety, depression, right intramedullary nail intertrochanteric, pelvic surgery for fracture, eye surgery. Natalie Thomas continues to reside at [place] at [facility]. Ms. Christon does transfer to the wheelchair on her own. Natalie Thomas is mobile in her wheelchair throughout the facility on her own though she does get tired out easily and fatigued. Natalie Thomas does remain on continuous oxygen. Natalie Thomas does require assistance for adl's, toileting. Natalie Thomas does feed herself though it takes her a while to eat as she does become intermittently short  of breath at times. 10/18/2019 to 5 /08/2019 hospitalized for acute on chronic hypoxic respiratory failure, acute on chronic congestive heart failure, hyperkalemia. She was placed on CPAP with Improvement, bnp 800 received Lasix. She was discharged back to [facility]. She is followed by Psychotherapy for talk therapy in addition to Psychiatry. Last psychiatric visit 5 / 20 / 2021 for anxiety, prescribed clonazepam and hydralazine. Staff endorses no new changes are concerns. At present Natalie Thomas is sitting up in bed. Natalie Thomas appears comfortable the chronically ill, debilitated. No visitors present. I visited and observed Natalie Thomas. We talked about purpose of palliative care visit. Natalie Thomas in agreement. We talked about how she was feeling. Natalie Thomas endorses she has chest x-ray was pending. We talked about the importance of being out of bed, mobility. Natalie Thomas at present time was able to speak full sentences without difficulty. Few wheezing with auscultation. Natalie Thomas was cooperative with this estimate. We talked about symptoms of pain or shortness of breath what she denies. We talked about her appetite which has been improving since hospitalization. We talked about debility. Medical goals reviewed. Natalie Thomas does have a DSS guardian and will contact for updates, further discussions medical goals of care in addition to faxing note. Natalie Thomas and I talked about facility, challenges with limited visiting though hopefully that will change soon. Most of palliative care visit supportive. Therapeutic listening and emotional support provided. Natalie Thomas talked about friends that she has. At present no new changes to current goals are plan of care. Prior to hospitalization discussion of hospice but wishes were to continue with therapy as able. I have updated nursing staff in the new changes to current goals or plan of care  Palliative Care was asked to help to continue to address  goals of care.   CODE STATUS: DNR  PPS: 50% HOSPICE ELIGIBILITY/DIAGNOSIS: TBD  PAST MEDICAL HISTORY:  Past Medical History:  Diagnosis Date  . Anemia 10/16/2017  . Anxiety and depression   . CHF (congestive heart failure) ([place])   . Chronic hyponatremia 04/08/2007   Qualifier: Diagnosis of  By: Marca Ancona RMA, Lucy    . Closed comminuted intertrochanteric fracture of right femur ([place]) 11/07/2017  . COPD 04/08/2007   Qualifier: Diagnosis of  By: Marca Ancona RMA, Lucy    . Depression   . Diabetes mellitus, type 2 ([place])    pt denies but states that she has been treated for DM  . Essential hypertension 04/08/2007   Qualifier: Diagnosis of  By: Marca Ancona RMA, Lucy    . GERD (gastroesophageal reflux disease)   . Hyperlipidemia 10/07/2017  . Lumbar disc disease 04/09/2011  . MENOPAUSE, PREMATURE 04/08/2007   Qualifier: Diagnosis of  By: Marca Ancona RMA, Lucy    . 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 Osteoporosis 04/08/2007   Qualifier: Diagnosis of  By: Reatha Armour, Lucy    . Polycythemia secondary to smoking 04/09/2011  . 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 Schizoaffective disorder, bipolar type ([place]) 07/05/2011    SOCIAL HX:  Social History   Tobacco Use  . Smoking status: Former Smoker    Packs/day: 0.25    Types: Cigarettes    Quit date: 07/23/2019    Years since quitting: 0.4  . Smokeless tobacco: Never Used  Substance Use Topics  . Alcohol use: No    ALLERGIES:  Allergies  Allergen Reactions  . Clarithromycin Itching  . Penicillins Itching    Has tolerated cefazolin before  Has patient had a PCN reaction causing immediate rash, facial/tongue/throat swelling, SOB or lightheadedness with hypotension: No Has patient had a PCN reaction causing severe rash involving mucus membranes or skin necrosis: No Has patient had a PCN reaction that required hospitalization: Unknown Has patient had a PCN reaction occurring within the last 10 years:  No If all of the above answers are "NO", then may proceed with Cephalosporin use.      PERTINENT MEDICATIONS:  Outpatient Encounter Medications as of 12/23/2019  Medication Sig  . acetaminophen (TYLENOL) 500 MG tablet Take 1,000 mg by mouth every 8 (eight) hours as needed for mild pain or moderate pain. Not to exceed 3g/24h of tylenol from all sources.  Marland Kitchen albuterol (PROVENTIL) (2.5 MG/3ML) 0.083% nebulizer solution Take 3 mLs (2.5 mg total) by nebulization every 4 (four) hours as needed for wheezing or shortness of breath.  Marland Kitchen albuterol (VENTOLIN HFA) 108 (90 Base) MCG/ACT inhaler Inhale 2 puffs into the lungs every 6 (six) hours as needed for wheezing or shortness of breath.  . Alum & Mag Hydroxide-Simeth (ANTACID LIQUID PO) Take 30 mLs by mouth every 6 (six) hours as needed (heartburn).  Marland Kitchen ascorbic acid (VITAMIN C) 1000 MG tablet Take 1 tablet (1,000 mg total) by mouth daily.  Marland Kitchen aspirin EC 81 MG EC tablet Take 1 tablet (81 mg total) by mouth daily.  Marland Kitchen buPROPion (WELLBUTRIN XL) 150 MG 24 hr tablet Take 150 mg by mouth daily.  . Calcium Carb-Cholecalciferol 600-400 MG-UNIT TABS Take 1 tablet by mouth daily.  . chlorthalidone (HYGROTON) 50 MG tablet Take 1 tablet (50 mg total) by mouth daily.  . clonazePAM (KLONOPIN) 1 MG tablet Take 0.5 tablets (0.5 mg total) by mouth 2 (two) times daily.  Marland Kitchen docusate sodium (COLACE) 100 MG capsule Take 100 mg by mouth 2 (two) times daily.   Marland Kitchen  feeding supplement, ENSURE ENLIVE, (ENSURE ENLIVE) LIQD Take 237 mLs by mouth daily.  . fluticasone furoate-vilanterol (BREO ELLIPTA) 100-25 MCG/INH AEPB Inhale 1 puff into the lungs daily.  . furosemide (LASIX) 20 MG tablet Take 3 tablets (60 mg total) by mouth 2 (two) times daily.  . hydrOXYzine (ATARAX/VISTARIL) 25 MG tablet Take 25 mg by mouth every 6 (six) hours as needed for anxiety or itching.  Marland Kitchen ipratropium-albuterol (DUONEB) 0.5-2.5 (3) MG/3ML SOLN Take 3 mLs by nebulization 3 (three) times daily.  Marland Kitchen lisinopril  (ZESTRIL) 10 MG tablet Take 1 tablet (10 mg total) by mouth daily.  Marland Kitchen loratadine (CLARITIN) 10 MG tablet Take 10 mg by mouth daily.  Marland Kitchen lurasidone (LATUDA) 20 MG TABS tablet Take 1 tablet (20 mg total) by mouth 2 (two) times daily.  . magnesium hydroxide (MILK OF MAGNESIA) 400 MG/5ML suspension Take 30 mLs by mouth daily as needed for mild constipation or moderate constipation.  . Melatonin 5 MG TABS Take 5 mg by mouth at bedtime.  . metoprolol succinate (TOPROL-XL) 25 MG 24 hr tablet Take 0.5 tablets (12.5 mg total) by mouth daily.  . Multiple Vitamin (MULTIVITAMIN WITH MINERALS) TABS tablet Take 1 tablet by mouth daily.  Marland Kitchen neomycin-bacitracin-polymyxin (NEOSPORIN) ointment Apply 1 application topically as needed for wound care.  . nicotine (NICODERM CQ - DOSED IN MG/24 HOURS) 21 mg/24hr patch Place 21 mg onto the skin daily.   Marland Kitchen NIFEdipine (ADALAT CC) 60 MG 24 hr tablet Take 1 tablet (60 mg total) by mouth daily.  Marland Kitchen nystatin (NYSTATIN) powder Apply 1 application topically 2 (two) times daily as needed (irritation). Apply topically to reddened area twice daily as needed for irritation  . OLANZapine (ZYPREXA) 10 MG tablet Take 10 mg by mouth 2 (two) times daily.  . ondansetron (ZOFRAN) 8 MG tablet Take 8 mg by mouth every 6 (six) hours as needed for nausea or vomiting.  . pantoprazole (PROTONIX) 40 MG tablet Take 1 tablet (40 mg total) by mouth daily.  . polyethylene glycol (MIRALAX) 17 g packet Take 17 g by mouth daily. (Patient taking differently: Take 17 g by mouth daily. Mix 1 capful (17 g) in 4-8 oz of water or juice and give by mouth once daily for bowel regimen)  . sodium chloride (OCEAN) 0.65 % SOLN nasal spray Place 2 sprays into both nostrils every 2 (two) hours as needed (dryness, irritation).  . sucralfate (CARAFATE) 1 GM/10ML suspension Take 10 mLs (1 g total) by mouth 4 (four) times daily -  with meals and at bedtime.  . valproic acid (DEPAKENE) 250 MG capsule Take 2 capsules (500 mg  total) by mouth 3 (three) times daily.   No facility-administered encounter medications on file as of 12/23/2019.    PHYSICAL EXAM:   General: chronically ill, debilitated, O2 dependent female Cardiovascular: regular rate and rhythm Pulmonary: few wheezes Neurological: w/c dependent  Sandor Arboleda Ihor Gully, NP

## 2020-01-15 ENCOUNTER — Non-Acute Institutional Stay: Payer: Medicare Other | Admitting: Nurse Practitioner

## 2020-01-15 ENCOUNTER — Encounter: Payer: Self-pay | Admitting: Nurse Practitioner

## 2020-01-15 NOTE — Progress Notes (Signed)
[facility] Consult Note Telephone: [phone]  Fax: [phone]  PATIENT NAME: Natalie Thomas DOB: 1961-01-07 MRN: [id number] PRIMARY CARE PROVIDER:Dr Yves Dill PROVIDER:Dr Hodges/[place] [phone], [phone]  RECOMMENDATIONS and PLAN: 1.ACP:DNR in place  2.Palliative care encounter; Palliative medicine team will continue to support patient, patient's family, and medical team. Visit consisted of counseling and education dealing with the complex and emotionally intense issues of symptom management and palliative care in the setting of serious and potentially life-threatening illness  I spent 60 minutes providing this consultation, start at 12:30pm. More than 50% of the time in this consultation was spent coordinating communication.   HISTORY OF PRESENT ILLNESS:  Natalie Thomas is a 59 y.o. year old female with multiple medical problems including Malignant neoplasm volvulus, polycythemia, diastolic congestive heart failure, history of pleural effusion, chronic respiratory failure with hypoxia and hypercapnia, anemia, COPD, hypertension, diabetes, chronic hyponatremia, hyperlipidemia, gerd, lumbar disc disease, osteoporosis, schizoaffective disorder bipolar type, anxiety, depression, right intramedullary nail intertrochanteric, pelvic surgery for fracture, eye surgery.  Natalie Thomas continues to reside in [place] at [facility]. Natalie Thomas does assistance for transfers to the wheelchair. Ms Thomas is mobile in wheelchair. Natalie Thomas does propel herself up and down the halls. Natalie Thomas does require assistance with adl's as she easily fatigues. Natalie Thomas is no longer on continuous oxygen just PRN in addition to her nebulizer treatments. Natalie Thomas does feed herself an appetite has been good. Staff endorse is no  new wounds, falls, hospitalization, infections. Last name by Psychiatry 12/31/2019 for schizophrenia continued on latuda with hydroxyzine and clonazepam for anxiety. She also takes melatonin for sleep and no new orders at this visit. Medical goals are DNR in place. At present Natalie Thomas is sitting in the wheelchair in the hall propelling herself. Natalie Thomas appears obese, comfortable, no distress. I visited and observed Natalie Thomas. No visitors present. We talked about purpose of palliative care visit. Natalie Thomas endorses that she is "doing okay". We talked about symptoms of pain and shortness of breath which she denies. Assessment completed no wheezing noted. We talked about challenges residing in [place]. Natalie Thomas endorse is she feels sometimes that the staff does not listen to her. We talked about her concerns and continue to support and refer back to facility staff. We talked about challenges residing at facility with coping strategies. We talked about realistic expectations. We talked about chronic disease progression. Limited discussion with Natalie Thomas as she has mild cognitive impairmemt.  Therapeutic listening,  emotional support provided. I attempted to contact Natalie Thomas, message left. No new recommendations at present time will continue to follow a monitor with next palliative visit in 2 months. Prior to hospitalization it was discussed about possibly Hospice is she was critically ill at that time though since she has improved in his doing well. Natalie Thomas is no longer hospice eligible as she does appear stable at present time. I updated nursing staff  Palliative Care was asked to help to continue to address goals of care.   CODE STATUS: DNR  PPS: 50% HOSPICE ELIGIBILITY/DIAGNOSIS: TBD  PAST MEDICAL HISTORY:  Past Medical History:  Diagnosis Date  . Anemia 10/16/2017  . Anxiety and depression   . CHF (congestive heart failure) ([place])   . Chronic hyponatremia 04/08/2007    Qualifier: Diagnosis of  By: Marca Ancona RMA, Lucy    .  Closed comminuted intertrochanteric fracture of right femur ([place]) 11/07/2017  . COPD 04/08/2007   Qualifier: Diagnosis of  By: Marca Ancona RMA, Lucy    . Depression   . Diabetes mellitus, type 2 ([place])    pt denies but states that she has been treated for DM  . Essential hypertension 04/08/2007   Qualifier: Diagnosis of  By: Marca Ancona RMA, Lucy    . GERD (gastroesophageal reflux disease)   . Hyperlipidemia 10/07/2017  . Lumbar disc disease 04/09/2011  . MENOPAUSE, PREMATURE 04/08/2007   Qualifier: Diagnosis of  By: Marca Ancona RMA, Lucy    . 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 Osteoporosis 04/08/2007   Qualifier: Diagnosis of  By: Reatha Armour, Lucy    . Polycythemia secondary to smoking 04/09/2011  . 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 Schizoaffective disorder, bipolar type ([place]) 07/05/2011    SOCIAL HX:  Social History   Tobacco Use  . Smoking status: Former Smoker    Packs/day: 0.25    Types: Cigarettes    Quit date: 07/23/2019    Years since quitting: 0.4  . Smokeless tobacco: Never Used  Substance Use Topics  . Alcohol use: No    ALLERGIES:  Allergies  Allergen Reactions  . Clarithromycin Itching  . Penicillins Itching    Has tolerated cefazolin before  Has patient had a PCN reaction causing immediate rash, facial/tongue/throat swelling, SOB or lightheadedness with hypotension: No Has patient had a PCN reaction causing severe rash involving mucus membranes or skin necrosis: No Has patient had a PCN reaction that required hospitalization: Unknown Has patient had a PCN reaction occurring within the last 10 years: No If all of the above answers are "NO", then may proceed with Cephalosporin use.      PERTINENT MEDICATIONS:  Outpatient Encounter Medications as of 01/15/2020  Medication Sig  . acetaminophen (TYLENOL) 500 MG tablet Take 1,000 mg by mouth every 8 (eight) hours as needed  for mild pain or moderate pain. Not to exceed 3g/24h of tylenol from all sources.  Marland Kitchen albuterol (PROVENTIL) (2.5 MG/3ML) 0.083% nebulizer solution Take 3 mLs (2.5 mg total) by nebulization every 4 (four) hours as needed for wheezing or shortness of breath.  Marland Kitchen albuterol (VENTOLIN HFA) 108 (90 Base) MCG/ACT inhaler Inhale 2 puffs into the lungs every 6 (six) hours as needed for wheezing or shortness of breath.  . Alum & Mag Hydroxide-Simeth (ANTACID LIQUID PO) Take 30 mLs by mouth every 6 (six) hours as needed (heartburn).  Marland Kitchen ascorbic acid (VITAMIN C) 1000 MG tablet Take 1 tablet (1,000 mg total) by mouth daily.  Marland Kitchen aspirin EC 81 MG EC tablet Take 1 tablet (81 mg total) by mouth daily.  Marland Kitchen buPROPion (WELLBUTRIN XL) 150 MG 24 hr tablet Take 150 mg by mouth daily.  . Calcium Carb-Cholecalciferol 600-400 MG-UNIT TABS Take 1 tablet by mouth daily.  . chlorthalidone (HYGROTON) 50 MG tablet Take 1 tablet (50 mg total) by mouth daily.  . clonazePAM (KLONOPIN) 1 MG tablet Take 0.5 tablets (0.5 mg total) by mouth 2 (two) times daily.  Marland Kitchen docusate sodium (COLACE) 100 MG capsule Take 100 mg by mouth 2 (two) times daily.   . feeding supplement, ENSURE ENLIVE, (ENSURE ENLIVE) LIQD Take 237 mLs by mouth daily.  . fluticasone furoate-vilanterol (BREO ELLIPTA) 100-25 MCG/INH AEPB Inhale 1 puff into the lungs daily.  . furosemide (LASIX) 20 MG tablet Take 3 tablets (60 mg total) by mouth 2 (two)  times daily.  . hydrOXYzine (ATARAX/VISTARIL) 25 MG tablet Take 25 mg by mouth every 6 (six) hours as needed for anxiety or itching.  Marland Kitchen ipratropium-albuterol (DUONEB) 0.5-2.5 (3) MG/3ML SOLN Take 3 mLs by nebulization 3 (three) times daily.  Marland Kitchen lisinopril (ZESTRIL) 10 MG tablet Take 1 tablet (10 mg total) by mouth daily.  Marland Kitchen loratadine (CLARITIN) 10 MG tablet Take 10 mg by mouth daily.  Marland Kitchen lurasidone (LATUDA) 20 MG TABS tablet Take 1 tablet (20 mg total) by mouth 2 (two) times daily.  . magnesium hydroxide (MILK OF MAGNESIA) 400  MG/5ML suspension Take 30 mLs by mouth daily as needed for mild constipation or moderate constipation.  . Melatonin 5 MG TABS Take 5 mg by mouth at bedtime.  . metoprolol succinate (TOPROL-XL) 25 MG 24 hr tablet Take 0.5 tablets (12.5 mg total) by mouth daily.  . Multiple Vitamin (MULTIVITAMIN WITH MINERALS) TABS tablet Take 1 tablet by mouth daily.  Marland Kitchen neomycin-bacitracin-polymyxin (NEOSPORIN) ointment Apply 1 application topically as needed for wound care.  . nicotine (NICODERM CQ - DOSED IN MG/24 HOURS) 21 mg/24hr patch Place 21 mg onto the skin daily.   Marland Kitchen NIFEdipine (ADALAT CC) 60 MG 24 hr tablet Take 1 tablet (60 mg total) by mouth daily.  Marland Kitchen nystatin (NYSTATIN) powder Apply 1 application topically 2 (two) times daily as needed (irritation). Apply topically to reddened area twice daily as needed for irritation  . OLANZapine (ZYPREXA) 10 MG tablet Take 10 mg by mouth 2 (two) times daily.  . ondansetron (ZOFRAN) 8 MG tablet Take 8 mg by mouth every 6 (six) hours as needed for nausea or vomiting.  . pantoprazole (PROTONIX) 40 MG tablet Take 1 tablet (40 mg total) by mouth daily.  . polyethylene glycol (MIRALAX) 17 g packet Take 17 g by mouth daily. (Patient taking differently: Take 17 g by mouth daily. Mix 1 capful (17 g) in 4-8 oz of water or juice and give by mouth once daily for bowel regimen)  . sodium chloride (OCEAN) 0.65 % SOLN nasal spray Place 2 sprays into both nostrils every 2 (two) hours as needed (dryness, irritation).  . sucralfate (CARAFATE) 1 GM/10ML suspension Take 10 mLs (1 g total) by mouth 4 (four) times daily -  with meals and at bedtime.  . valproic acid (DEPAKENE) 250 MG capsule Take 2 capsules (500 mg total) by mouth 3 (three) times daily.   No facility-administered encounter medications on file as of 01/15/2020.    PHYSICAL EXAM:   General: obese, debilitated, female Cardiovascular: regular rate and rhythm Pulmonary: clear ant fields Neurological: w/c  dependent  Veronica Guerrant Ihor Gully, NP

## 2020-01-24 ENCOUNTER — Emergency Department: Payer: Medicare Other

## 2020-01-24 ENCOUNTER — Encounter: Payer: Self-pay | Admitting: Radiology

## 2020-01-24 ENCOUNTER — Inpatient Hospital Stay
Admission: EM | Admit: 2020-01-24 | Discharge: 2020-02-02 | DRG: 644 | Disposition: A | Discharge status: Skilled Nursing Facility | Payer: Medicare Other | Attending: Internal Medicine | Admitting: Internal Medicine

## 2020-01-24 DIAGNOSIS — Z66 Do not resuscitate: Secondary | ICD-10-CM | POA: Diagnosis present

## 2020-01-24 DIAGNOSIS — I5032 Chronic diastolic (congestive) heart failure: Secondary | ICD-10-CM

## 2020-01-24 DIAGNOSIS — J9611 Chronic respiratory failure with hypoxia: Secondary | ICD-10-CM | POA: Diagnosis not present

## 2020-01-24 DIAGNOSIS — Z87891 Personal history of nicotine dependence: Secondary | ICD-10-CM

## 2020-01-24 DIAGNOSIS — I5033 Acute on chronic diastolic (congestive) heart failure: Secondary | ICD-10-CM | POA: Diagnosis present

## 2020-01-24 DIAGNOSIS — R0600 Dyspnea, unspecified: Secondary | ICD-10-CM

## 2020-01-24 DIAGNOSIS — R112 Nausea with vomiting, unspecified: Secondary | ICD-10-CM | POA: Diagnosis present

## 2020-01-24 DIAGNOSIS — K449 Diaphragmatic hernia without obstruction or gangrene: Secondary | ICD-10-CM | POA: Diagnosis present

## 2020-01-24 DIAGNOSIS — J449 Chronic obstructive pulmonary disease, unspecified: Secondary | ICD-10-CM | POA: Diagnosis present

## 2020-01-24 DIAGNOSIS — F250 Schizoaffective disorder, bipolar type: Secondary | ICD-10-CM | POA: Diagnosis present

## 2020-01-24 DIAGNOSIS — N179 Acute kidney failure, unspecified: Secondary | ICD-10-CM | POA: Diagnosis not present

## 2020-01-24 DIAGNOSIS — E119 Type 2 diabetes mellitus without complications: Secondary | ICD-10-CM | POA: Diagnosis present

## 2020-01-24 DIAGNOSIS — R631 Polydipsia: Secondary | ICD-10-CM

## 2020-01-24 DIAGNOSIS — K5900 Constipation, unspecified: Secondary | ICD-10-CM | POA: Diagnosis not present

## 2020-01-24 DIAGNOSIS — K429 Umbilical hernia without obstruction or gangrene: Secondary | ICD-10-CM | POA: Diagnosis present

## 2020-01-24 DIAGNOSIS — E669 Obesity, unspecified: Secondary | ICD-10-CM | POA: Diagnosis present

## 2020-01-24 DIAGNOSIS — Z9981 Dependence on supplemental oxygen: Secondary | ICD-10-CM

## 2020-01-24 DIAGNOSIS — E871 Hypo-osmolality and hyponatremia: Secondary | ICD-10-CM

## 2020-01-24 DIAGNOSIS — E785 Hyperlipidemia, unspecified: Secondary | ICD-10-CM | POA: Diagnosis present

## 2020-01-24 DIAGNOSIS — F54 Psychological and behavioral factors associated with disorders or diseases classified elsewhere: Secondary | ICD-10-CM

## 2020-01-24 DIAGNOSIS — Z20822 Contact with and (suspected) exposure to covid-19: Secondary | ICD-10-CM | POA: Diagnosis present

## 2020-01-24 DIAGNOSIS — E222 Syndrome of inappropriate secretion of antidiuretic hormone: Secondary | ICD-10-CM | POA: Diagnosis not present

## 2020-01-24 DIAGNOSIS — R109 Unspecified abdominal pain: Secondary | ICD-10-CM

## 2020-01-24 DIAGNOSIS — F329 Major depressive disorder, single episode, unspecified: Secondary | ICD-10-CM | POA: Diagnosis present

## 2020-01-24 DIAGNOSIS — M810 Age-related osteoporosis without current pathological fracture: Secondary | ICD-10-CM | POA: Diagnosis present

## 2020-01-24 DIAGNOSIS — Z880 Allergy status to penicillin: Secondary | ICD-10-CM

## 2020-01-24 DIAGNOSIS — F419 Anxiety disorder, unspecified: Secondary | ICD-10-CM | POA: Diagnosis present

## 2020-01-24 DIAGNOSIS — N3289 Other specified disorders of bladder: Secondary | ICD-10-CM | POA: Diagnosis present

## 2020-01-24 DIAGNOSIS — Z79899 Other long term (current) drug therapy: Secondary | ICD-10-CM

## 2020-01-24 DIAGNOSIS — Z8544 Personal history of malignant neoplasm of other female genital organs: Secondary | ICD-10-CM

## 2020-01-24 DIAGNOSIS — Z6837 Body mass index (BMI) 37.0-37.9, adult: Secondary | ICD-10-CM

## 2020-01-24 DIAGNOSIS — J42 Unspecified chronic bronchitis: Secondary | ICD-10-CM | POA: Diagnosis not present

## 2020-01-24 DIAGNOSIS — E875 Hyperkalemia: Secondary | ICD-10-CM | POA: Diagnosis not present

## 2020-01-24 DIAGNOSIS — K219 Gastro-esophageal reflux disease without esophagitis: Secondary | ICD-10-CM | POA: Diagnosis present

## 2020-01-24 DIAGNOSIS — Z881 Allergy status to other antibiotic agents status: Secondary | ICD-10-CM

## 2020-01-24 DIAGNOSIS — I2510 Atherosclerotic heart disease of native coronary artery without angina pectoris: Secondary | ICD-10-CM | POA: Diagnosis present

## 2020-01-24 DIAGNOSIS — Z7951 Long term (current) use of inhaled steroids: Secondary | ICD-10-CM

## 2020-01-24 DIAGNOSIS — I110 Hypertensive heart disease with heart failure: Secondary | ICD-10-CM | POA: Diagnosis present

## 2020-01-24 LAB — URINALYSIS, COMPLETE (UACMP) WITH MICROSCOPIC
Bacteria, UA: NONE SEEN
Bilirubin Urine: NEGATIVE
Glucose, UA: NEGATIVE mg/dL
Hgb urine dipstick: NEGATIVE
Ketones, ur: NEGATIVE mg/dL
Nitrite: NEGATIVE
Protein, ur: NEGATIVE mg/dL
Specific Gravity, Urine: 1.003 — ABNORMAL LOW (ref 1.005–1.030)
Squamous Epithelial / HPF: NONE SEEN (ref 0–5)
pH: 8 (ref 5.0–8.0)

## 2020-01-24 LAB — COMPREHENSIVE METABOLIC PANEL
ALT: 10 U/L (ref 0–44)
AST: 17 U/L (ref 15–41)
Albumin: 3.8 g/dL (ref 3.5–5.0)
Alkaline Phosphatase: 60 U/L (ref 38–126)
Anion gap: 13 (ref 5–15)
BUN: 21 mg/dL — ABNORMAL HIGH (ref 6–20)
CO2: 31 mmol/L (ref 22–32)
Calcium: 9.1 mg/dL (ref 8.9–10.3)
Chloride: 81 mmol/L — ABNORMAL LOW (ref 98–111)
Creatinine, Ser: 0.79 mg/dL (ref 0.44–1.00)
GFR calc Af Amer: >60 mL/min (ref >60)
GFR calc non Af Amer: >60 mL/min (ref >60)
Glucose, Bld: 116 mg/dL — ABNORMAL HIGH (ref 70–99)
Potassium: 4.1 mmol/L (ref 3.5–5.1)
Sodium: 125 mmol/L — ABNORMAL LOW (ref 135–145)
Total Bilirubin: 0.6 mg/dL (ref 0.3–1.2)
Total Protein: 7.9 g/dL (ref 6.5–8.1)

## 2020-01-24 LAB — CBC WITH DIFFERENTIAL/PLATELET
Abs Immature Granulocytes: 0.03 10*3/uL (ref 0.00–0.07)
Basophils Absolute: 0 10*3/uL (ref 0.0–0.1)
Basophils Relative: 0 %
Eosinophils Absolute: 0.1 10*3/uL (ref 0.0–0.5)
Eosinophils Relative: 1 %
HCT: 35.5 % — ABNORMAL LOW (ref 36.0–46.0)
Hemoglobin: 12.1 g/dL (ref 12.0–15.0)
Immature Granulocytes: 0 %
Lymphocytes Relative: 17 %
Lymphs Abs: 1.4 10*3/uL (ref 0.7–4.0)
MCH: 28.6 pg (ref 26.0–34.0)
MCHC: 34.1 g/dL (ref 30.0–36.0)
MCV: 83.9 fL (ref 80.0–100.0)
Monocytes Absolute: 0.7 10*3/uL (ref 0.1–1.0)
Monocytes Relative: 8 %
Neutro Abs: 5.9 10*3/uL (ref 1.7–7.7)
Neutrophils Relative %: 74 %
Platelets: 195 10*3/uL (ref 150–400)
RBC: 4.23 MIL/uL (ref 3.87–5.11)
RDW: 13.2 % (ref 11.5–15.5)
WBC: 8.1 10*3/uL (ref 4.0–10.5)
nRBC: 0 % (ref 0.0–0.2)

## 2020-01-24 LAB — SARS CORONAVIRUS 2 BY RT PCR (HOSPITAL ORDER, PERFORMED IN ~~LOC~~ HOSPITAL LAB): SARS Coronavirus 2: NEGATIVE

## 2020-01-24 LAB — LIPASE, BLOOD: Lipase: 25 U/L (ref 11–51)

## 2020-01-24 LAB — TROPONIN I (HIGH SENSITIVITY)
Troponin I (High Sensitivity): 18 ng/L — ABNORMAL HIGH (ref <18)
Troponin I (High Sensitivity): 20 ng/L — ABNORMAL HIGH (ref <18)

## 2020-01-24 MED ORDER — LORAZEPAM 2 MG/ML IJ SOLN
2.0000 mg | Freq: Once | INTRAMUSCULAR | Status: AC
  Administered 2020-01-24: 2 mg via INTRAVENOUS
  Filled 2020-01-24: qty 1

## 2020-01-24 MED ORDER — SODIUM CHLORIDE 0.9 % IV SOLN: Freq: Once | INTRAVENOUS | Status: AC

## 2020-01-24 MED ORDER — IOHEXOL 300 MG/ML  SOLN
100.0000 mL | Freq: Once | INTRAMUSCULAR | Status: AC | PRN
  Administered 2020-01-24: 100 mL via INTRAVENOUS

## 2020-01-24 MED ORDER — FENTANYL CITRATE (PF) 100 MCG/2ML IJ SOLN
50.0000 ug | Freq: Once | INTRAMUSCULAR | Status: AC
  Administered 2020-01-24: 50 ug via INTRAVENOUS
  Filled 2020-01-24: qty 2

## 2020-01-24 NOTE — ED Notes (Signed)
Attending MD at bedside.

## 2020-01-24 NOTE — ED Notes (Signed)
Pt requesting night time medications in frantic tone. This RN explained to pt I will give her medication list to pharmacy tech for review. Pt understands.

## 2020-01-24 NOTE — ED Provider Notes (Signed)
[facility] Emergency Department Provider Note   ____________________________________________   I have reviewed the triage vital signs and the nursing notes.   HISTORY  Chief Complaint Abdominal Pain   History limited by: Not Limited   HPI Natalie Thomas is a 59 y.o. female who presents to the emergency department today with primary complaint of abdominal pain. Patient states it has been going on for quite a while although it does come and go. Cannot identify anything that makes it come on. The patient says the pain is located throughout her stomach. It is associated with decreased appetite. In addition the patient has complaint of chest pain. Denies any shortness of breath. Denies any fevers.   Records reviewed. Per medical record review patient has a history of CHF, GERD, COPD.  Past Medical History:  Diagnosis Date  . Anemia 10/16/2017  . Anxiety and depression   . CHF (congestive heart failure) ([place])   . Chronic hyponatremia 04/08/2007   Qualifier: Diagnosis of  By: Marca Ancona RMA, Lucy    . Closed comminuted intertrochanteric fracture of right femur ([place]) 11/07/2017  . COPD 04/08/2007   Qualifier: Diagnosis of  By: Marca Ancona RMA, Lucy    . Depression   . Diabetes mellitus, type 2 ([place])    pt denies but states that she has been treated for DM  . Essential hypertension 04/08/2007   Qualifier: Diagnosis of  By: Marca Ancona RMA, Lucy    . GERD (gastroesophageal reflux disease)   . Hyperlipidemia 10/07/2017  . Lumbar disc disease 04/09/2011  . MENOPAUSE, PREMATURE 04/08/2007   Qualifier: Diagnosis of  By: Marca Ancona RMA, Lucy    . 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 Osteoporosis 04/08/2007   Qualifier: Diagnosis of  By: Reatha Armour, Lucy    . Polycythemia secondary to smoking 04/09/2011  . 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 Schizoaffective disorder, bipolar type ([place]) 07/05/2011    Patient Active Problem List    Diagnosis Date Noted  . Acute respiratory failure ([place]) 10/18/2019  . DNR (do not resuscitate) discussion   . Goals of care, counseling/discussion   . Palliative care by specialist   . Acute on chronic respiratory failure ([place]) 10/01/2019  . Acute on chronic diastolic CHF (congestive heart failure) ([place])   . Shortness of breath 09/22/2019  . Acute on chronic respiratory failure with hypercapnia ([place]) 09/13/2019  . Leukocytosis 09/13/2019  . Class 2 obesity with body mass index (BMI) of 39.0 to 39.9 in adult   . Acute on chronic respiratory failure with hypoxemia ([place]) 09/04/2019  . COVID-19 virus infection   . 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 Nausea   . Acute gastritis   . Abdominal pain, chronic, epigastric   . Hyponatremia 07/09/2019  . Ventral hernia 07/09/2019  . Hypertensive crisis 05/01/2019  . Acute abdominal pain 03/15/2019  . Pleural effusion 03/13/2019  . Atelectasis 03/13/2019  . Acute on chronic combined systolic and diastolic congestive heart failure ([place]) 03/13/2019  . Type 2 diabetes mellitus without complication ([place]) 55/73/2202  . Urinary tract bacterial infections 03/09/2019  . Acute cystitis without hematuria   . Constipation   . Acute hypoxemic respiratory failure ([place]) 12/03/2018  . Irritability 11/09/2018  . Aggressive behavior 11/09/2018  . Agitation   . S/P right hip fracture 11/07/2017  . Closed comminuted intertrochanteric fracture of right femur ([place]) 11/07/2017  . Anxiety and depression   . Dyspnea 10/16/2017  . Anemia 10/16/2017  .  COPD exacerbation ([place]) 10/07/2017  . Hyperlipidemia 10/07/2017  . Acute on chronic respiratory failure with hypoxia and hypercapnia ([place]) 10/07/2017  . Incontinence of urine 08/14/2011  . Schizoaffective disorder, bipolar type ([place]) 07/05/2011  . Cough 05/08/2011  . Lumbar disc disease 04/09/2011  . Polycythemia secondary to smoking 04/09/2011  . HIP PAIN, LEFT 12/12/2007  . NEOPLASM, MALIGNANT, VULVA  04/08/2007  . DM (diabetes mellitus), type 2 ([place]) 04/08/2007  . MENOPAUSE, PREMATURE 04/08/2007  . Chronic hyponatremia 04/08/2007  . Schizoaffective disorder ([place]) 04/08/2007  . Essential hypertension 04/08/2007  . COPD (chronic obstructive pulmonary disease) ([place]) 04/08/2007  . GERD 04/08/2007  . Osteoporosis 04/08/2007    Past Surgical History:  Procedure Laterality Date  . BIOPSY  07/11/2019   Procedure: BIOPSY;  Surgeon: Jerene Bears, MD;  Location: [facility] ENDOSCOPY;  Service: Gastroenterology;;  . ESOPHAGOGASTRODUODENOSCOPY (EGD) WITH PROPOFOL N/A 07/11/2019   Procedure: ESOPHAGOGASTRODUODENOSCOPY (EGD) WITH PROPOFOL;  Surgeon: Jerene Bears, MD;  Location: [place];  Service: Gastroenterology;  Laterality: N/A;  . EYE SURGERY     on left  eye, pt states that she sees bad out of the right eye and needs surgery there  . INTRAMEDULLARY (IM) NAIL INTERTROCHANTERIC Right 11/13/2017   Procedure: INTRAMEDULLARY (IM) NAIL INTERTROCHANTRIC;  Surgeon: Shona Needles, MD;  Location: [place];  Service: Orthopedics;  Laterality: Right;  . PELVIC FRACTURE SURGERY      Prior to Admission medications   Medication Sig Start Date End Date Taking? Authorizing Provider  acetaminophen (TYLENOL) 500 MG tablet Take 1,000 mg by mouth every 8 (eight) hours as needed for mild pain or moderate pain. Not to exceed 3g/24h of tylenol from all sources.    [provider]  albuterol (PROVENTIL) (2.5 MG/3ML) 0.083% nebulizer solution Take 3 mLs (2.5 mg total) by nebulization every 4 (four) hours as needed for wheezing or shortness of breath. 12/11/18   Swayze, Ava, DO  albuterol (VENTOLIN HFA) 108 (90 Base) MCG/ACT inhaler Inhale 2 puffs into the lungs every 6 (six) hours as needed for wheezing or shortness of breath.    [provider]  Alum & Mag Hydroxide-Simeth (ANTACID LIQUID PO) Take 30 mLs by mouth every 6 (six) hours as needed (heartburn).    [provider]  ascorbic acid  (VITAMIN C) 1000 MG tablet Take 1 tablet (1,000 mg total) by mouth daily. 09/17/19   Lorella Nimrod, MD  aspirin EC 81 MG EC tablet Take 1 tablet (81 mg total) by mouth daily. 10/26/19   Swayze, Ava, DO  buPROPion (WELLBUTRIN XL) 150 MG 24 hr tablet Take 150 mg by mouth daily.    [provider]  Calcium Carb-Cholecalciferol 600-400 MG-UNIT TABS Take 1 tablet by mouth daily.    [provider]  chlorthalidone (HYGROTON) 50 MG tablet Take 1 tablet (50 mg total) by mouth daily. 10/26/19   Swayze, Ava, DO  clonazePAM (KLONOPIN) 1 MG tablet Take 0.5 tablets (0.5 mg total) by mouth 2 (two) times daily. 10/25/19   Swayze, Ava, DO  docusate sodium (COLACE) 100 MG capsule Take 100 mg by mouth 2 (two) times daily.     [provider]  feeding supplement, ENSURE ENLIVE, (ENSURE ENLIVE) LIQD Take 237 mLs by mouth daily. 03/14/19   Oretha Milch D, MD  fluticasone furoate-vilanterol (BREO ELLIPTA) 100-25 MCG/INH AEPB Inhale 1 puff into the lungs daily.    [provider]  furosemide (LASIX) 20 MG tablet Take 3 tablets (60 mg total) by mouth 2 (two)  times daily. 10/25/19   Swayze, Ava, DO  hydrOXYzine (ATARAX/VISTARIL) 25 MG tablet Take 25 mg by mouth every 6 (six) hours as needed for anxiety or itching.    [provider]  ipratropium-albuterol (DUONEB) 0.5-2.5 (3) MG/3ML SOLN Take 3 mLs by nebulization 3 (three) times daily. 10/25/19   Swayze, Ava, DO  lisinopril (ZESTRIL) 10 MG tablet Take 1 tablet (10 mg total) by mouth daily. 10/26/19   Swayze, Ava, DO  loratadine (CLARITIN) 10 MG tablet Take 10 mg by mouth daily.    [provider]  lurasidone (LATUDA) 20 MG TABS tablet Take 1 tablet (20 mg total) by mouth 2 (two) times daily. 05/15/19   Hongalgi, Lenis Dickinson, MD  magnesium hydroxide (MILK OF MAGNESIA) 400 MG/5ML suspension Take 30 mLs by mouth daily as needed for mild constipation or moderate constipation. 10/25/19   Swayze, Ava, DO  Melatonin 5 MG TABS Take 5 mg by mouth  at bedtime.    [provider]  metoprolol succinate (TOPROL-XL) 25 MG 24 hr tablet Take 0.5 tablets (12.5 mg total) by mouth daily. 10/26/19   Swayze, Ava, DO  Multiple Vitamin (MULTIVITAMIN WITH MINERALS) TABS tablet Take 1 tablet by mouth daily. 03/15/19   Desiree Hane, MD  neomycin-bacitracin-polymyxin (NEOSPORIN) ointment Apply 1 application topically as needed for wound care.    [provider]  nicotine (NICODERM CQ - DOSED IN MG/24 HOURS) 21 mg/24hr patch Place 21 mg onto the skin daily.     [provider]  NIFEdipine (ADALAT CC) 60 MG 24 hr tablet Take 1 tablet (60 mg total) by mouth daily. 10/26/19   Swayze, Ava, DO  nystatin (NYSTATIN) powder Apply 1 application topically 2 (two) times daily as needed (irritation). Apply topically to reddened area twice daily as needed for irritation    [provider]  OLANZapine (ZYPREXA) 10 MG tablet Take 10 mg by mouth 2 (two) times daily.    [provider]  ondansetron (ZOFRAN) 8 MG tablet Take 8 mg by mouth every 6 (six) hours as needed for nausea or vomiting.    [provider]  pantoprazole (PROTONIX) 40 MG tablet Take 1 tablet (40 mg total) by mouth daily. 07/16/19 01/12/20  Mercy Riding, MD  polyethylene glycol (MIRALAX) 17 g packet Take 17 g by mouth daily. Patient taking differently: Take 17 g by mouth daily. Mix 1 capful (17 g) in 4-8 oz of water or juice and give by mouth once daily for bowel regimen 12/11/18   Swayze, Ava, DO  sodium chloride (OCEAN) 0.65 % SOLN nasal spray Place 2 sprays into both nostrils every 2 (two) hours as needed (dryness, irritation).    [provider]  sucralfate (CARAFATE) 1 GM/10ML suspension Take 10 mLs (1 g total) by mouth 4 (four) times daily -  with meals and at bedtime. 07/16/19   Mercy Riding, MD  valproic acid (DEPAKENE) 250 MG capsule Take 2 capsules (500 mg total) by mouth 3 (three) times daily. 09/08/19   Fritzi Mandes, MD     Allergies Clarithromycin and Penicillins  No family history on file.  Social History Social History   Tobacco Use  . Smoking status: Former Smoker    Packs/day: 0.25    Types: Cigarettes    Quit date: 07/23/2019    Years since quitting: 0.5  . Smokeless tobacco: Never Used  Vaping Use  . Vaping Use: Never used  Substance Use Topics  . Alcohol use: No  . Drug  use: No    Review of Systems Constitutional: No fever/chills Eyes: No visual changes. ENT: No sore throat. Cardiovascular: Positive for chest pain. Respiratory: Denies shortness of breath. Gastrointestinal: Positive for abdominal pain.  Genitourinary: Negative for dysuria. Musculoskeletal: Negative for back pain. Skin: Negative for rash. Neurological: Negative for headaches, focal weakness or numbness.  ____________________________________________   PHYSICAL EXAM:  VITAL SIGNS: ED Triage Vitals  Enc Vitals Group     BP 01/24/20 1803 (!) 166/98     Pulse Rate 01/24/20 1803 88     Resp 01/24/20 1803 18     Temp 01/24/20 1803 98.2 F (36.8 C)     Temp Source 01/24/20 1803 Oral     SpO2 01/24/20 1803 100 %     Weight 01/24/20 1808 (!) 224 lb 13.9 oz (102 kg)     Height 01/24/20 1808 5\' 5"  (1.651 m)     Head Circumference --      Peak Flow --      Pain Score 01/24/20 1808 8    Constitutional: Alert and oriented.  Eyes: Conjunctivae are normal.  ENT      Head: Normocephalic and atraumatic.      Nose: No congestion/rhinnorhea.      Mouth/Throat: Mucous membranes are moist.      Neck: No stridor. Hematological/Lymphatic/Immunilogical: No cervical lymphadenopathy. Cardiovascular: Normal rate, regular rhythm.  No murmurs, rubs, or gallops. Respiratory: Normal respiratory effort without tachypnea nor retractions. Breath sounds are clear and equal bilaterally. No wheezes/rales/rhonchi. Gastrointestinal: Soft and minimally diffusely tender to palpation.  Genitourinary: Deferred Musculoskeletal: Normal  range of motion in all extremities. No lower extremity edema. Neurologic:  Normal speech and language. No gross focal neurologic deficits are appreciated.  Skin:  Skin is warm, dry and intact. No rash noted. Psychiatric: Mood and affect are normal. Speech and behavior are normal. Patient exhibits appropriate insight and judgment.  ____________________________________________    LABS (pertinent positives/negatives)  Trop hs 18 to 20 Lipase 25 UA clear, trace leukocytes, 0-5 rbc and wbc CBC wbc 8.1, hgb 12.1, plt 195 CMP na 125, k 4.1, cl 81, glu 116, cr 0.79 COVID negative ____________________________________________   EKG  I, Nance Pear, attending physician, personally viewed and interpreted this EKG  EKG Time: 1801 Rate: 80 Rhythm: sinus rhythm Axis: normal Intervals: qtc 469 QRS: narrow, q waves v1 ST changes: no st elevation Impression: abnormal ekg   ____________________________________________    RADIOLOGY  CXR Chronic cardiomegaly with vascular congestion.  ____________________________________________   PROCEDURES  Procedures  ____________________________________________   INITIAL IMPRESSION / ASSESSMENT AND PLAN / ED COURSE  Pertinent labs & imaging results that were available during my care of the patient were reviewed by me and considered in my medical decision making (see chart for details).   Patient presented to the emergency department today with complaints of abdominal pain.  On exam she had somewhat diffuse abdominal tenderness but no focal tenderness.  Initial blood work did show hyponatremia.  Per chart review she has had this in the past.  Thought to be somewhat related to polydipsia.  No leukocytosis however given persistence of discomfort a CT abdomen pelvis was performed which did not show any acute or concerning findings.  The patient also had troponin levels drawn given some chest pain and chest x-ray.  None of this showed concerning  findings.  Given hyponatremia however will plan on admission.  ____________________________________________   FINAL CLINICAL IMPRESSION(S) / ED DIAGNOSES  Final diagnoses:  Hyponatremia  Abdominal pain,  unspecified abdominal location     Note: This dictation was prepared with Dragon dictation. Any transcriptional errors that result from this process are unintentional     Nance Pear, MD 01/24/20 2303

## 2020-01-24 NOTE — ED Notes (Signed)
Pt transported to CT ?

## 2020-01-24 NOTE — ED Triage Notes (Signed)
Pt biba via stretcher for c/o abd pain today. Transferred to ed bed 3 via drawsheet.  Alert and oriented to person and event, however does have repetitive speech.  Airway patent, rr even/unlabored, clear bilaterally, abd soft/obese/nontender to papation.  No bruising, masses noted. Skin warm/pink/dry, VSS placed on bedside cardiac monitor.

## 2020-01-24 NOTE — H&P (Signed)
History and Physical    Natalie Thomas KGM:[id number] DOB: 1961/02/01 DOA: 01/24/2020  PCP: Dr. Nyra Capes, [facility]  Patient coming from: Home   Chief Complaint: Nausea, malaise, abdominal cramping   HPI: Natalie Thomas is a 59 y.o. female with medical history significant for vulvar cancer, COPD, chronic hypoxic respiratory failure, schizoaffective disorder, chronic hyponatremia, hypertension, and chronic diastolic CHF, now presenting to the emergency department for evaluation of several complaints including nausea, malaise, and abdominal discomfort.  Patient has a difficult time providing history but indicates longstanding cramping discomfort in her abdomen that has recently worsened, nausea, and episode of nonbloody vomiting yesterday morning, and general malaise.  She denies any fevers, worsening shortness of breath or cough, or chest pain.  ED Course: Upon arrival to the ED, patient is found to be afebrile, saturating well on her usual 4 L/min of supplemental oxygen, and with stable blood pressure.  EKG features sinus rhythm with LVH.  Chest x-ray notable for chronic cardiomegaly and vascular congestion with improved pulmonary edema from prior study.  CT of the abdomen and pelvis demonstrates some increase in fat and bowel-containing umbilical hernia but without any obstruction or vascular compromise.  Chemistry panel notable for sodium of 125, down from 130 in May 2021.  CBC unremarkable.  Troponin slightly elevated.  Urinalysis with low specific gravity.  Covid PCR is negative.  Patient was given 150 cc of saline, fentanyl, and Ativan in the ED and hospitalist asked to admit for hyponatremia.  Review of Systems:  All other systems reviewed and apart from HPI, are negative.  Past Medical History:  Diagnosis Date  . Anemia 10/16/2017  . Anxiety and depression   . CHF (congestive heart failure) ([place])   . Chronic hyponatremia 04/08/2007   Qualifier: Diagnosis of  By:  Marca Ancona RMA, Lucy    . Closed comminuted intertrochanteric fracture of right femur ([place]) 11/07/2017  . COPD 04/08/2007   Qualifier: Diagnosis of  By: Marca Ancona RMA, Lucy    . Depression   . Diabetes mellitus, type 2 ([place])    pt denies but states that she has been treated for DM  . Essential hypertension 04/08/2007   Qualifier: Diagnosis of  By: Marca Ancona RMA, Lucy    . GERD (gastroesophageal reflux disease)   . Hyperlipidemia 10/07/2017  . Lumbar disc disease 04/09/2011  . MENOPAUSE, PREMATURE 04/08/2007   Qualifier: Diagnosis of  By: Marca Ancona RMA, Lucy    . 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 Osteoporosis 04/08/2007   Qualifier: Diagnosis of  By: Reatha Armour, Lucy    . Polycythemia secondary to smoking 04/09/2011  . 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 Schizoaffective disorder, bipolar type ([place]) 07/05/2011    Past Surgical History:  Procedure Laterality Date  . BIOPSY  07/11/2019   Procedure: BIOPSY;  Surgeon: Jerene Bears, MD;  Location: [facility] ENDOSCOPY;  Service: Gastroenterology;;  . ESOPHAGOGASTRODUODENOSCOPY (EGD) WITH PROPOFOL N/A 07/11/2019   Procedure: ESOPHAGOGASTRODUODENOSCOPY (EGD) WITH PROPOFOL;  Surgeon: Jerene Bears, MD;  Location: [place];  Service: Gastroenterology;  Laterality: N/A;  . EYE SURGERY     on left  eye, pt states that she sees bad out of the right eye and needs surgery there  . INTRAMEDULLARY (IM) NAIL INTERTROCHANTERIC Right 11/13/2017   Procedure: INTRAMEDULLARY (IM) NAIL INTERTROCHANTRIC;  Surgeon: Shona Needles, MD;  Location: [place];  Service: Orthopedics;  Laterality: Right;  . PELVIC FRACTURE SURGERY  Social History:   reports that she quit smoking about 6 months ago. Her smoking use included cigarettes. She smoked 0.25 packs per day. She has never used smokeless tobacco. She reports that she does not drink alcohol and does not use drugs.  Allergies  Allergen Reactions  . Clarithromycin Itching  .  Penicillins Itching    Has tolerated cefazolin before  Has patient had a PCN reaction causing immediate rash, facial/tongue/throat swelling, SOB or lightheadedness with hypotension: No Has patient had a PCN reaction causing severe rash involving mucus membranes or skin necrosis: No Has patient had a PCN reaction that required hospitalization: Unknown Has patient had a PCN reaction occurring within the last 10 years: No If all of the above answers are "NO", then may proceed with Cephalosporin use.     History reviewed. No pertinent family history.   Prior to Admission medications   Medication Sig Start Date End Date Taking? Authorizing Provider  acetaminophen (TYLENOL) 500 MG tablet Take 1,000 mg by mouth every 8 (eight) hours as needed for mild pain or moderate pain. Not to exceed 3g/24h of tylenol from all sources.   Yes [provider]  albuterol (PROVENTIL) (2.5 MG/3ML) 0.083% nebulizer solution Take 3 mLs (2.5 mg total) by nebulization every 4 (four) hours as needed for wheezing or shortness of breath. 12/11/18  Yes Swayze, Ava, DO  albuterol (VENTOLIN HFA) 108 (90 Base) MCG/ACT inhaler Inhale 2 puffs into the lungs every 6 (six) hours as needed for wheezing or shortness of breath.   Yes [provider]  Alum & Mag Hydroxide-Simeth (ANTACID LIQUID PO) Take 30 mLs by mouth every 6 (six) hours as needed (heartburn).   Yes [provider]  ascorbic acid (VITAMIN C) 1000 MG tablet Take 1 tablet (1,000 mg total) by mouth daily. 09/17/19  Yes Lorella Nimrod, MD  buPROPion (WELLBUTRIN XL) 150 MG 24 hr tablet Take 150 mg by mouth daily.   Yes [provider]  Calcium Carb-Cholecalciferol 600-400 MG-UNIT TABS Take 1 tablet by mouth daily.   Yes [provider]  chlorthalidone (HYGROTON) 50 MG tablet Take 1 tablet (50 mg total) by mouth daily. 10/26/19  Yes Swayze, Ava, DO  clonazePAM (KLONOPIN) 1 MG tablet Take 0.5 tablets (0.5 mg total) by mouth 2 (two)  times daily. 10/25/19  Yes Swayze, Ava, DO  divalproex (DEPAKOTE ER) 250 MG 24 hr tablet Take 250 mg by mouth 2 (two) times daily.   Yes [provider]  docusate sodium (COLACE) 100 MG capsule Take 100 mg by mouth 2 (two) times daily.    Yes [provider]  fluticasone furoate-vilanterol (BREO ELLIPTA) 100-25 MCG/INH AEPB Inhale 1 puff into the lungs daily.   Yes [provider]  furosemide (LASIX) 20 MG tablet Take 3 tablets (60 mg total) by mouth 2 (two) times daily. Patient taking differently: Take 20 mg by mouth 2 (two) times daily.  10/25/19  Yes Swayze, Ava, DO  guaiFENesin (ROBITUSSIN) 100 MG/5ML SOLN Take 15 mLs by mouth every 4 (four) hours as needed for cough or to loosen phlegm.   Yes [provider]  hydrOXYzine (ATARAX/VISTARIL) 25 MG tablet Take 25 mg by mouth every 6 (six) hours as needed for anxiety or itching.   Yes [provider]  ipratropium-albuterol (DUONEB) 0.5-2.5 (3) MG/3ML SOLN Take 3 mLs by nebulization 3 (three) times daily. 10/25/19  Yes Swayze, Ava, DO  lisinopril (ZESTRIL) 10 MG tablet Take 1 tablet (10 mg total) by mouth daily. 10/26/19  Yes Swayze, Ava, DO  loratadine (CLARITIN) 10 MG tablet Take 10 mg by mouth daily.   Yes [provider]  lurasidone (LATUDA) 20 MG TABS tablet Take 1 tablet (20 mg total) by mouth 2 (two) times daily. 05/15/19  Yes Hongalgi, Lenis Dickinson, MD  magnesium hydroxide (MILK OF MAGNESIA) 400 MG/5ML suspension Take 30 mLs by mouth daily as needed for mild constipation or moderate constipation. 10/25/19  Yes Swayze, Ava, DO  Melatonin 5 MG TABS Take 5 mg by mouth at bedtime.   Yes [provider]  metoprolol succinate (TOPROL-XL) 25 MG 24 hr tablet Take 0.5 tablets (12.5 mg total) by mouth daily. 10/26/19  Yes Swayze, Ava, DO  Multiple Vitamin (MULTIVITAMIN WITH MINERALS) TABS tablet Take 1 tablet by mouth daily. 03/15/19  Yes Oretha Milch D, MD  neomycin-bacitracin-polymyxin (NEOSPORIN)  ointment Apply 1 application topically as needed for wound care.   Yes [provider]  nicotine (NICODERM CQ - DOSED IN MG/24 HOURS) 21 mg/24hr patch Place 21 mg onto the skin daily.    Yes [provider]  NIFEdipine (ADALAT CC) 60 MG 24 hr tablet Take 1 tablet (60 mg total) by mouth daily. 10/26/19  Yes Swayze, Ava, DO  nystatin (NYSTATIN) powder Apply 1 application topically 2 (two) times daily as needed (irritation). Apply topically to reddened area twice daily as needed for irritation   Yes [provider]  OLANZapine (ZYPREXA) 10 MG tablet Take 10 mg by mouth 2 (two) times daily.   Yes [provider]  ondansetron (ZOFRAN) 8 MG tablet Take 8 mg by mouth every 6 (six) hours as needed for nausea or vomiting.   Yes [provider]  pantoprazole (PROTONIX) 40 MG tablet Take 1 tablet (40 mg total) by mouth daily. 07/16/19 01/24/20 Yes Mercy Riding, MD  polyethylene glycol (MIRALAX) 17 g packet Take 17 g by mouth daily. Patient taking differently: Take 17 g by mouth daily. Mix 1 capful (17 g) in 4-8 oz of water or juice and give by mouth once daily for bowel regimen 12/11/18  Yes Swayze, Ava, DO  sodium chloride (OCEAN) 0.65 % SOLN nasal spray Place 2 sprays into both nostrils every 2 (two) hours as needed (dryness, irritation).   Yes [provider]  sucralfate (CARAFATE) 1 GM/10ML suspension Take 10 mLs (1 g total) by mouth 4 (four) times daily -  with meals and at bedtime. 07/16/19  Yes Mercy Riding, MD  aspirin EC 81 MG EC tablet Take 1 tablet (81 mg total) by mouth daily. Patient not taking: Reported on 01/24/2020 10/26/19   Swayze, Ava, DO  feeding supplement, ENSURE ENLIVE, (ENSURE ENLIVE) LIQD Take 237 mLs by mouth daily. Patient not taking: Reported on 01/24/2020 03/14/19   Oretha Milch D, MD    Physical Exam: Vitals:   01/24/20 2100 01/24/20 2134 01/24/20 2230 01/24/20 2342  BP: (!) 127/89 (!) 125/86 123/81 (!) 161/97  Pulse: 64 66 65 63    Resp: 17 17 17 22   Temp:    97.7 F (36.5 C)  TempSrc:    Oral  SpO2: 97% 100% 98% 100%  Weight:      Height:        Constitutional: NAD, calm  Eyes: PERTLA, lids and conjunctivae normal ENMT: Mucous membranes are moist. Exam limited by surgical mask.   Neck: normal, supple, no masses, no thyromegaly Respiratory: no wheezing, no crackles. No accessory muscle use.  Cardiovascular: S1 & S2 heard, regular rate and rhythm.  Mild pitting edema bilateral LEs.   Abdomen: No distension, soft, generally tender without rebound pain or guarding. Bowel sounds active.  Musculoskeletal: no clubbing / cyanosis. No joint deformity upper and lower extremities.   Skin: no significant rashes, lesions, ulcers. Warm, dry, well-perfused. Neurologic: CN 2-12 grossly intact. Sensation intact. Moving all extremites.  Psychiatric: Alert and oriented to person, place, and situation. Tearful at times, easily distracted, cooperative.    Labs and Imaging on Admission: I have personally reviewed following labs and imaging studies  CBC: Recent Labs  Lab 01/24/20 1849  WBC 8.1  NEUTROABS 5.9  HGB 12.1  HCT 35.5*  MCV 83.9  PLT 532   Basic Metabolic Panel: Recent Labs  Lab 01/24/20 1849  NA 125*  K 4.1  CL 81*  CO2 31  GLUCOSE 116*  BUN 21*  CREATININE 0.79  CALCIUM 9.1   GFR: Estimated Creatinine Clearance: 90.8 mL/min (by C-G formula based on SCr of 0.79 mg/dL). Liver Function Tests: Recent Labs  Lab 01/24/20 1849  AST 17  ALT 10  ALKPHOS 60  BILITOT 0.6  PROT 7.9  ALBUMIN 3.8   Recent Labs  Lab 01/24/20 1849  LIPASE 25   No results for input(s): AMMONIA in the last 168 hours. Coagulation Profile: No results for input(s): INR, PROTIME in the last 168 hours. Cardiac Enzymes: No results for input(s): CKTOTAL, CKMB, CKMBINDEX, TROPONINI in the last 168 hours. BNP (last 3 results) No results for input(s): PROBNP in the last 8760 hours. HbA1C: No results for input(s): HGBA1C in  the last 72 hours. CBG: No results for input(s): GLUCAP in the last 168 hours. Lipid Profile: No results for input(s): CHOL, HDL, LDLCALC, TRIG, CHOLHDL, LDLDIRECT in the last 72 hours. Thyroid Function Tests: No results for input(s): TSH, T4TOTAL, FREET4, T3FREE, THYROIDAB in the last 72 hours. Anemia Panel: No results for input(s): VITAMINB12, FOLATE, FERRITIN, TIBC, IRON, RETICCTPCT in the last 72 hours. Urine analysis:    Component Value Date/Time   COLORURINE COLORLESS (A) 01/24/2020 1920   APPEARANCEUR CLEAR (A) 01/24/2020 1920   APPEARANCEUR Clear 06/22/2012 0618   LABSPEC 1.003 (L) 01/24/2020 1920   LABSPEC 1.003 06/22/2012 0618   PHURINE 8.0 01/24/2020 1920   GLUCOSEU NEGATIVE 01/24/2020 1920   GLUCOSEU Negative 06/22/2012 0618   GLUCOSEU NEGATIVE 04/09/2011 1058   HGBUR NEGATIVE 01/24/2020 1920   BILIRUBINUR NEGATIVE 01/24/2020 1920   BILIRUBINUR Negative 06/22/2012 0618   KETONESUR NEGATIVE 01/24/2020 1920   PROTEINUR NEGATIVE 01/24/2020 1920   UROBILINOGEN 1.0 08/18/2011 1805   NITRITE NEGATIVE 01/24/2020 1920   LEUKOCYTESUR TRACE (A) 01/24/2020 1920   LEUKOCYTESUR Trace 06/22/2012 0618   Sepsis Labs: @LABRCNTIP (procalcitonin:4,lacticidven:4) ) Recent Results (from the past 240 hour(s))  SARS Coronavirus 2 by RT PCR (hospital order, performed in [place] hospital lab) Nasopharyngeal Nasopharyngeal Swab     Status: None   Collection Time: 01/24/20  7:49 PM   Specimen: Nasopharyngeal Swab  Result Value Ref Range Status   SARS Coronavirus 2 NEGATIVE NEGATIVE Final    Comment: (NOTE) SARS-CoV-2 target nucleic acids are NOT DETECTED.  The SARS-CoV-2 RNA is generally detectable in upper and lower respiratory specimens during the acute phase of infection. The lowest concentration of SARS-CoV-2 viral copies this assay can detect is 250 copies / mL. A negative result does not preclude SARS-CoV-2 infection and should not be used as the sole basis for treatment or  other patient management decisions.  A negative result may occur with improper specimen collection / handling,  submission of specimen other than nasopharyngeal swab, presence of viral mutation(s) within the areas targeted by this assay, and inadequate number of viral copies (<250 copies / mL). A negative result must be combined with clinical observations, patient history, and epidemiological information.  Fact Sheet for Patients:   StrictlyIdeas.no  Fact Sheet for Healthcare Providers: BankingDealers.co.za  This test is not yet approved or  cleared by the [place] FDA and has been authorized for detection and/or diagnosis of SARS-CoV-2 by FDA under an Emergency Use Authorization (EUA).  This EUA will remain in effect (meaning this test can be used) for the duration of the COVID-19 declaration under Section 564(b)(1) of the Act, 21 U.S.C. section 360bbb-3(b)(1), unless the authorization is terminated or revoked sooner.  Performed at [facility], [address] [place], [place] [address]      Radiological Exams on Admission: CT ABDOMEN PELVIS W CONTRAST  Result Date: 01/24/2020 CLINICAL DATA:  Abdominal pain EXAM: CT ABDOMEN AND PELVIS WITH CONTRAST TECHNIQUE: Multidetector CT imaging of the abdomen and pelvis was performed using the standard protocol following bolus administration of intravenous contrast. CONTRAST:  158mL OMNIPAQUE IOHEXOL 300 MG/ML  SOLN COMPARISON:  CT 07/01/2019 FINDINGS: Lower chest: Trace left pleural effusion with associated atelectasis in the left lung base. Right basilar atelectasis noted as well. Mild cardiomegaly with coronary artery calcifications and a small pericardial effusion. Hepatobiliary: No focal liver abnormality is seen. Patient is post cholecystectomy. Slight prominence of the biliary tree likely related to reservoir effect. No calcified intraductal gallstones. Pancreas: Unremarkable. No  pancreatic ductal dilatation or surrounding inflammatory changes. Spleen: Normal in size. No concerning splenic lesions. Adrenals/Urinary Tract: Normal adrenal glands. Kidneys enhance and excrete symmetrically. Few subcentimeter hypoattenuating foci too small to fully characterize on CT imaging but statistically likely benign. Stable mild bilateral perinephric stranding, nonspecific. No concerning renal mass. Bilateral nonobstructing nephrolithiasis as well as vascular calcifications. Scarring seen in the interpolar right kidney. No obstructive urolithiasis or hydronephrosis. Mild bladder distention with circumferential bladder wall thickening and indentation of the bladder base by an enlarged prostate. Stomach/Bowel: Small sliding-type hiatal hernia with fluid in the distal thoracic esophagus, correlate for reflux. Stomach and duodenum are unremarkable. No proximal small bowel thickening or dilatation. Loops of small bowel protrude into a fat and bowel containing umbilical hernia. No resulting mechanical obstruction or evidence of vascular compromise at this time. No colonic dilatation or wall thickening. Scattered colonic diverticula without focal inflammation to suggest diverticulitis. Vascular/Lymphatic: Extensive atherosclerotic calcifications within the abdominal aorta and branch vessels including exuberant calcification in the mid abdominal aorta below the renal artery origins which may result in some high-grade stenosis (2/30). Additional plaque narrowing of the celiac, SMA, renal artery and IMA origins is likely present though incompletely assessed on this non angiographic technique. No aneurysm or ectasia. No enlarged abdominopelvic lymph nodes. Reproductive: Anteverted uterus.  No concerning adnexal lesions. Other: Fat and bowel containing umbilical hernia increased in size from comparison without evidence of resulting mechanical obstruction or vascular compromise at this time. Mild edematous changes of  the bilateral flanks. Soft tissue nodules in the subcutaneous fat of the anterolateral abdominal wall may reflect injectable use. Musculoskeletal: Multilevel degenerative changes are present in the imaged portions of the spine. Unchanged compression deformities at T11 and L1 as well as the superior endplate L3. Exaggerated lumbar lordosis similar to comparison. Hyperdense changes at the SI joints may reflect augmentation of prior insufficiency fractures. 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 Additional remote posttraumatic deformities of the right superior pubic ramus. IMPRESSION: 1. Fat and bowel containing umbilical hernia increased in size from comparison without evidence of resulting mechanical obstruction or vascular compromise at this time. 2. Mild circumferential bladder wall thickening and bladder distension. Correlate for urinary retention symptoms and consider urinalysis to exclude cystitis. 3. Small sliding-type hiatal hernia with fluid in the distal thoracic esophagus, correlate for reflux. 4. Trace left pleural effusion with associated atelectasis in the left lung base. 5. Cardiomegaly and coronary atherosclerosis with stable small pericardial effusion. 6. 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 7. Remote compression deformities T11, L1. Likely augmentation of remote bilateral sacral insufficiency fractures. Correlate with procedure history. 8. Aortic Atherosclerosis (ICD10-I70.0). Electronically Signed   By: Lovena Le M.D.   On: 01/24/2020 21:44   DG Chest Portable 1 View  Result Date: 01/24/2020 CLINICAL DATA:  Chest pain.  Abdominal pain today. EXAM: PORTABLE CHEST 1 VIEW COMPARISON:  Chest radiograph 10/18/2019 FINDINGS: Chronic cardiomegaly with slight improvement from prior  exam. Aortic atherosclerosis. Vascular congestion. Improved pulmonary edema from prior exam. There is mild central peribronchial thickening. No large pleural effusion. No evidence of pneumothorax or focal airspace disease. IMPRESSION: Chronic cardiomegaly with vascular congestion. Improved pulmonary edema from prior exam. Mild central peribronchial thickening may be bronchitic or congestive. Electronically Signed   By: Keith Rake M.D.   On: 01/24/2020 18:59    EKG: Independently reviewed. Sinus rhythm, LVH.   Assessment/Plan   1. Hyponatremia  - Presents with various complaints including nausea and abdominal discomfort and is found to have serum sodium of 125, down from 130 in May 2021 - Her nausea and malaise could be secondary to this though no severe signs/symptoms of hyponatremia present  - She has hx of psychogenic polydipsia, is on diuretics including chlorthalidone, and appears a little hypervolemic  - She was given 150 cc NS in ED  - SLIV, restrict free-water, hold chlorthalidone, continue Lasix, and repeat chem panel in am   2. Chronic diastolic CHF  - Appears hypervolemic but no dyspnea and CXR looks improved  - EF was preserved in April 2021  - SLIV, continue Lasix, continue ACE and beta-blocker, monitor weights and I/Os   3. COPD; chronic hypoxic respiratory failure  - No cough or wheezing on admission, stable on her usual 4 Lpm supplemental O2  - Continue ICS/LABA, Duonebs, supplemental O2    4. Schizoaffective disorder  - Continue home regimen with Zyprexa, Depakote, Latuda, Wellbutrin, Klonopin    5. Hypertension  - BP at goal, continue lisinopril, metoprolol, Lasix   DVT prophylaxis: Lovenox  Code Status: DNR, confirmed on admission  Family Communication: Discussed with patient  Disposition Plan:  Patient is from: SNF  Anticipated d/c is to: SNF Anticipated d/c date is: 01/25/20 Patient currently: Pending repeat chem panel  Consults called: None  Admission  status: Observation     Vianne Bulls, MD [facility]  01/25/2020, 12:18 AM

## 2020-01-25 ENCOUNTER — Other Ambulatory Visit: Payer: Self-pay

## 2020-01-25 DIAGNOSIS — R631 Polydipsia: Secondary | ICD-10-CM | POA: Diagnosis present

## 2020-01-25 DIAGNOSIS — F419 Anxiety disorder, unspecified: Secondary | ICD-10-CM | POA: Diagnosis present

## 2020-01-25 DIAGNOSIS — E875 Hyperkalemia: Secondary | ICD-10-CM | POA: Diagnosis not present

## 2020-01-25 DIAGNOSIS — Z20822 Contact with and (suspected) exposure to covid-19: Secondary | ICD-10-CM | POA: Diagnosis present

## 2020-01-25 DIAGNOSIS — E871 Hypo-osmolality and hyponatremia: Secondary | ICD-10-CM | POA: Diagnosis present

## 2020-01-25 DIAGNOSIS — Z66 Do not resuscitate: Secondary | ICD-10-CM | POA: Diagnosis present

## 2020-01-25 DIAGNOSIS — J449 Chronic obstructive pulmonary disease, unspecified: Secondary | ICD-10-CM | POA: Diagnosis present

## 2020-01-25 DIAGNOSIS — Z87891 Personal history of nicotine dependence: Secondary | ICD-10-CM | POA: Diagnosis not present

## 2020-01-25 DIAGNOSIS — E119 Type 2 diabetes mellitus without complications: Secondary | ICD-10-CM | POA: Diagnosis present

## 2020-01-25 DIAGNOSIS — I110 Hypertensive heart disease with heart failure: Secondary | ICD-10-CM | POA: Diagnosis present

## 2020-01-25 DIAGNOSIS — F250 Schizoaffective disorder, bipolar type: Secondary | ICD-10-CM | POA: Diagnosis present

## 2020-01-25 DIAGNOSIS — N179 Acute kidney failure, unspecified: Secondary | ICD-10-CM | POA: Diagnosis not present

## 2020-01-25 DIAGNOSIS — K429 Umbilical hernia without obstruction or gangrene: Secondary | ICD-10-CM | POA: Diagnosis present

## 2020-01-25 DIAGNOSIS — M810 Age-related osteoporosis without current pathological fracture: Secondary | ICD-10-CM | POA: Diagnosis present

## 2020-01-25 DIAGNOSIS — J9611 Chronic respiratory failure with hypoxia: Secondary | ICD-10-CM | POA: Diagnosis present

## 2020-01-25 DIAGNOSIS — E222 Syndrome of inappropriate secretion of antidiuretic hormone: Secondary | ICD-10-CM | POA: Diagnosis present

## 2020-01-25 DIAGNOSIS — Z6837 Body mass index (BMI) 37.0-37.9, adult: Secondary | ICD-10-CM | POA: Diagnosis not present

## 2020-01-25 DIAGNOSIS — E785 Hyperlipidemia, unspecified: Secondary | ICD-10-CM | POA: Diagnosis present

## 2020-01-25 DIAGNOSIS — K5900 Constipation, unspecified: Secondary | ICD-10-CM | POA: Diagnosis not present

## 2020-01-25 DIAGNOSIS — I5032 Chronic diastolic (congestive) heart failure: Secondary | ICD-10-CM | POA: Diagnosis present

## 2020-01-25 DIAGNOSIS — Z79899 Other long term (current) drug therapy: Secondary | ICD-10-CM | POA: Diagnosis not present

## 2020-01-25 DIAGNOSIS — N3289 Other specified disorders of bladder: Secondary | ICD-10-CM | POA: Diagnosis present

## 2020-01-25 DIAGNOSIS — E669 Obesity, unspecified: Secondary | ICD-10-CM | POA: Diagnosis present

## 2020-01-25 DIAGNOSIS — Z9981 Dependence on supplemental oxygen: Secondary | ICD-10-CM | POA: Diagnosis not present

## 2020-01-25 DIAGNOSIS — I2510 Atherosclerotic heart disease of native coronary artery without angina pectoris: Secondary | ICD-10-CM | POA: Diagnosis present

## 2020-01-25 LAB — CBC WITH DIFFERENTIAL/PLATELET
Abs Immature Granulocytes: 0.02 10*3/uL (ref 0.00–0.07)
Basophils Absolute: 0 10*3/uL (ref 0.0–0.1)
Basophils Relative: 0 %
Eosinophils Absolute: 0.1 10*3/uL (ref 0.0–0.5)
Eosinophils Relative: 1 %
HCT: 33.2 % — ABNORMAL LOW (ref 36.0–46.0)
Hemoglobin: 11.6 g/dL — ABNORMAL LOW (ref 12.0–15.0)
Immature Granulocytes: 0 %
Lymphocytes Relative: 14 %
Lymphs Abs: 0.9 10*3/uL (ref 0.7–4.0)
MCH: 29.4 pg (ref 26.0–34.0)
MCHC: 34.9 g/dL (ref 30.0–36.0)
MCV: 84.1 fL (ref 80.0–100.0)
Monocytes Absolute: 0.7 10*3/uL (ref 0.1–1.0)
Monocytes Relative: 11 %
Neutro Abs: 4.6 10*3/uL (ref 1.7–7.7)
Neutrophils Relative %: 74 %
Platelets: 168 10*3/uL (ref 150–400)
RBC: 3.95 MIL/uL (ref 3.87–5.11)
RDW: 13.3 % (ref 11.5–15.5)
WBC: 6.3 10*3/uL (ref 4.0–10.5)
nRBC: 0 % (ref 0.0–0.2)

## 2020-01-25 LAB — BASIC METABOLIC PANEL
Anion gap: 8 (ref 5–15)
BUN: 18 mg/dL (ref 6–20)
CO2: 31 mmol/L (ref 22–32)
Calcium: 8.7 mg/dL — ABNORMAL LOW (ref 8.9–10.3)
Chloride: 87 mmol/L — ABNORMAL LOW (ref 98–111)
Creatinine, Ser: 0.77 mg/dL (ref 0.44–1.00)
GFR calc Af Amer: >60 mL/min (ref >60)
GFR calc non Af Amer: >60 mL/min (ref >60)
Glucose, Bld: 140 mg/dL — ABNORMAL HIGH (ref 70–99)
Potassium: 3.8 mmol/L (ref 3.5–5.1)
Sodium: 126 mmol/L — ABNORMAL LOW (ref 135–145)

## 2020-01-25 LAB — MRSA PCR SCREENING: MRSA by PCR: POSITIVE — AB

## 2020-01-25 LAB — HIV ANTIBODY (ROUTINE TESTING W REFLEX): HIV Screen 4th Generation wRfx: NONREACTIVE

## 2020-01-25 MED ORDER — SODIUM CHLORIDE 0.9 % IV SOLN: 250.0000 mL | INTRAVENOUS | Status: DC | PRN

## 2020-01-25 MED ORDER — SUCRALFATE 1 GM/10ML PO SUSP
1.0000 g | Freq: Three times a day (TID) | ORAL | Status: DC
  Administered 2020-01-25 – 2020-02-02 (×33): 1 g via ORAL
  Filled 2020-01-25 (×33): qty 10

## 2020-01-25 MED ORDER — MUPIROCIN 2 % EX OINT
1.0000 "application " | TOPICAL_OINTMENT | Freq: Two times a day (BID) | CUTANEOUS | Status: AC
  Administered 2020-01-25 – 2020-01-29 (×9): 1 via NASAL
  Filled 2020-01-25: qty 22

## 2020-01-25 MED ORDER — TRAZODONE HCL 50 MG PO TABS
50.0000 mg | ORAL_TABLET | Freq: Every evening | ORAL | Status: DC | PRN
  Administered 2020-01-25 – 2020-02-01 (×8): 50 mg via ORAL
  Filled 2020-01-25 (×8): qty 1

## 2020-01-25 MED ORDER — ENOXAPARIN SODIUM 40 MG/0.4ML ~~LOC~~ SOLN
40.0000 mg | SUBCUTANEOUS | Status: DC
  Administered 2020-01-25 – 2020-01-26 (×3): 40 mg via SUBCUTANEOUS
  Filled 2020-01-25 (×3): qty 0.4

## 2020-01-25 MED ORDER — POLYETHYLENE GLYCOL 3350 17 G PO PACK
17.0000 g | PACK | Freq: Two times a day (BID) | ORAL | Status: DC
  Administered 2020-01-25 – 2020-02-02 (×14): 17 g via ORAL
  Filled 2020-01-25 (×15): qty 1

## 2020-01-25 MED ORDER — ACETAMINOPHEN 325 MG PO TABS
650.0000 mg | ORAL_TABLET | Freq: Four times a day (QID) | ORAL | Status: DC | PRN
  Administered 2020-01-25 – 2020-01-30 (×4): 650 mg via ORAL
  Filled 2020-01-25 (×4): qty 2

## 2020-01-25 MED ORDER — FLUTICASONE FUROATE-VILANTEROL 100-25 MCG/INH IN AEPB
1.0000 | INHALATION_SPRAY | Freq: Every day | RESPIRATORY_TRACT | Status: DC
  Administered 2020-01-25 – 2020-02-02 (×9): 1 via RESPIRATORY_TRACT
  Filled 2020-01-25: qty 28

## 2020-01-25 MED ORDER — IPRATROPIUM-ALBUTEROL 0.5-2.5 (3) MG/3ML IN SOLN
3.0000 mL | Freq: Three times a day (TID) | RESPIRATORY_TRACT | Status: DC
  Administered 2020-01-25 – 2020-01-31 (×19): 3 mL via RESPIRATORY_TRACT
  Filled 2020-01-25 (×20): qty 3

## 2020-01-25 MED ORDER — NIFEDIPINE ER 60 MG PO TB24
60.0000 mg | ORAL_TABLET | Freq: Every day | ORAL | Status: DC
  Administered 2020-01-25 – 2020-02-02 (×9): 60 mg via ORAL
  Filled 2020-01-25 (×10): qty 1

## 2020-01-25 MED ORDER — MORPHINE SULFATE (PF) 2 MG/ML IV SOLN
2.0000 mg | Freq: Once | INTRAVENOUS | Status: AC
  Administered 2020-01-25: 2 mg via INTRAVENOUS
  Filled 2020-01-25: qty 1

## 2020-01-25 MED ORDER — SODIUM CHLORIDE 0.9% FLUSH
3.0000 mL | Freq: Two times a day (BID) | INTRAVENOUS | Status: DC
  Administered 2020-01-25 – 2020-02-02 (×13): 3 mL via INTRAVENOUS

## 2020-01-25 MED ORDER — LISINOPRIL 10 MG PO TABS
10.0000 mg | ORAL_TABLET | Freq: Every day | ORAL | Status: DC
  Administered 2020-01-25 – 2020-01-27 (×3): 10 mg via ORAL
  Filled 2020-01-25 (×3): qty 1

## 2020-01-25 MED ORDER — ONDANSETRON HCL 4 MG PO TABS
4.0000 mg | ORAL_TABLET | Freq: Four times a day (QID) | ORAL | Status: DC | PRN
  Administered 2020-01-28 – 2020-02-02 (×4): 4 mg via ORAL
  Filled 2020-01-25 (×4): qty 1

## 2020-01-25 MED ORDER — DIVALPROEX SODIUM ER 250 MG PO TB24
250.0000 mg | ORAL_TABLET | Freq: Two times a day (BID) | ORAL | Status: DC
  Administered 2020-01-25 (×2): 250 mg via ORAL
  Filled 2020-01-25 (×3): qty 1

## 2020-01-25 MED ORDER — POLYETHYLENE GLYCOL 3350 17 G PO PACK
17.0000 g | PACK | Freq: Every day | ORAL | Status: DC
  Administered 2020-01-25: 17 g via ORAL
  Filled 2020-01-25: qty 1

## 2020-01-25 MED ORDER — BUPROPION HCL ER (XL) 150 MG PO TB24
150.0000 mg | ORAL_TABLET | Freq: Every day | ORAL | Status: DC
  Administered 2020-01-25 – 2020-02-02 (×9): 150 mg via ORAL
  Filled 2020-01-25 (×9): qty 1

## 2020-01-25 MED ORDER — CLONAZEPAM 0.5 MG PO TABS
0.5000 mg | ORAL_TABLET | Freq: Two times a day (BID) | ORAL | Status: DC
  Administered 2020-01-25 – 2020-02-02 (×17): 0.5 mg via ORAL
  Filled 2020-01-25 (×17): qty 1

## 2020-01-25 MED ORDER — DOCUSATE SODIUM 100 MG PO CAPS
100.0000 mg | ORAL_CAPSULE | Freq: Two times a day (BID) | ORAL | Status: DC
  Administered 2020-01-25 – 2020-02-02 (×17): 100 mg via ORAL
  Filled 2020-01-25 (×18): qty 1

## 2020-01-25 MED ORDER — ACETAMINOPHEN 650 MG RE SUPP: 650.0000 mg | Freq: Four times a day (QID) | RECTAL | Status: DC | PRN

## 2020-01-25 MED ORDER — CHLORHEXIDINE GLUCONATE CLOTH 2 % EX PADS
6.0000 | MEDICATED_PAD | Freq: Every day | CUTANEOUS | Status: AC
  Administered 2020-01-25 – 2020-01-29 (×3): 6 via TOPICAL

## 2020-01-25 MED ORDER — POLYVINYL ALCOHOL 1.4 % OP SOLN
1.0000 [drp] | OPHTHALMIC | Status: DC | PRN
  Administered 2020-01-29 – 2020-02-02 (×5): 1 [drp] via OPHTHALMIC
  Filled 2020-01-25: qty 15

## 2020-01-25 MED ORDER — MELATONIN 5 MG PO TABS
5.0000 mg | ORAL_TABLET | Freq: Every day | ORAL | Status: DC
  Administered 2020-01-25: 5 mg via ORAL
  Filled 2020-01-25: qty 1

## 2020-01-25 MED ORDER — OLANZAPINE 10 MG PO TABS
10.0000 mg | ORAL_TABLET | Freq: Two times a day (BID) | ORAL | Status: DC
  Administered 2020-01-25 – 2020-02-02 (×18): 10 mg via ORAL
  Filled 2020-01-25 (×21): qty 1

## 2020-01-25 MED ORDER — PANTOPRAZOLE SODIUM 40 MG PO TBEC
40.0000 mg | DELAYED_RELEASE_TABLET | Freq: Every day | ORAL | Status: DC
  Administered 2020-01-25 – 2020-02-02 (×9): 40 mg via ORAL
  Filled 2020-01-25 (×9): qty 1

## 2020-01-25 MED ORDER — FUROSEMIDE 20 MG PO TABS
20.0000 mg | ORAL_TABLET | Freq: Two times a day (BID) | ORAL | Status: DC
  Administered 2020-01-25 – 2020-01-26 (×4): 20 mg via ORAL
  Filled 2020-01-25 (×4): qty 1

## 2020-01-25 MED ORDER — DIPHENHYDRAMINE HCL 25 MG PO CAPS
25.0000 mg | ORAL_CAPSULE | Freq: Four times a day (QID) | ORAL | Status: DC | PRN
  Administered 2020-01-25 – 2020-01-26 (×2): 25 mg via ORAL
  Filled 2020-01-25 (×2): qty 1

## 2020-01-25 MED ORDER — METOPROLOL SUCCINATE ER 25 MG PO TB24
12.5000 mg | ORAL_TABLET | Freq: Every day | ORAL | Status: DC
  Administered 2020-01-25 – 2020-02-02 (×9): 12.5 mg via ORAL
  Filled 2020-01-25 (×9): qty 1

## 2020-01-25 MED ORDER — OXYCODONE HCL 5 MG PO TABS
5.0000 mg | ORAL_TABLET | Freq: Once | ORAL | Status: AC
  Administered 2020-01-25: 5 mg via ORAL
  Filled 2020-01-25: qty 1

## 2020-01-25 MED ORDER — LURASIDONE HCL 40 MG PO TABS
20.0000 mg | ORAL_TABLET | Freq: Two times a day (BID) | ORAL | Status: DC
  Administered 2020-01-25 – 2020-02-02 (×18): 20 mg via ORAL
  Filled 2020-01-25 (×21): qty 1

## 2020-01-25 MED ORDER — GUAIFENESIN 100 MG/5ML PO SOLN
15.0000 mL | ORAL | Status: DC | PRN
  Administered 2020-01-25: 300 mg via ORAL
  Filled 2020-01-25 (×3): qty 15

## 2020-01-25 MED ORDER — NICOTINE 21 MG/24HR TD PT24
21.0000 mg | MEDICATED_PATCH | Freq: Every day | TRANSDERMAL | Status: DC
  Administered 2020-01-25 – 2020-02-02 (×9): 21 mg via TRANSDERMAL
  Filled 2020-01-25 (×9): qty 1

## 2020-01-25 MED ORDER — DICYCLOMINE HCL 20 MG PO TABS
20.0000 mg | ORAL_TABLET | Freq: Three times a day (TID) | ORAL | Status: DC | PRN
  Administered 2020-01-25 – 2020-02-02 (×8): 20 mg via ORAL
  Filled 2020-01-25 (×9): qty 1

## 2020-01-25 MED ORDER — BACITRACIN-NEOMYCIN-POLYMYXIN 400-5-5000 EX OINT
1.0000 "application " | TOPICAL_OINTMENT | CUTANEOUS | Status: DC | PRN
  Administered 2020-01-26 – 2020-02-01 (×3): 1 via TOPICAL
  Filled 2020-01-25 (×3): qty 1

## 2020-01-25 MED ORDER — ALBUTEROL SULFATE (2.5 MG/3ML) 0.083% IN NEBU
2.5000 mg | INHALATION_SOLUTION | Freq: Four times a day (QID) | RESPIRATORY_TRACT | Status: DC | PRN
  Administered 2020-01-26 – 2020-01-30 (×2): 2.5 mg via RESPIRATORY_TRACT
  Filled 2020-01-25 (×2): qty 3

## 2020-01-25 MED ORDER — ONDANSETRON HCL 4 MG/2ML IJ SOLN: 4.0000 mg | Freq: Four times a day (QID) | INTRAMUSCULAR | Status: DC | PRN

## 2020-01-25 MED ORDER — MORPHINE SULFATE (PF) 2 MG/ML IV SOLN
1.0000 mg | Freq: Once | INTRAVENOUS | Status: AC
  Administered 2020-01-25: 1 mg via INTRAVENOUS
  Filled 2020-01-25: qty 1

## 2020-01-25 MED ORDER — HYDROXYZINE HCL 25 MG PO TABS
25.0000 mg | ORAL_TABLET | Freq: Four times a day (QID) | ORAL | Status: DC | PRN
  Administered 2020-01-25 (×3): 25 mg via ORAL
  Filled 2020-01-25 (×3): qty 1

## 2020-01-25 MED ORDER — DIVALPROEX SODIUM ER 500 MG PO TB24
500.0000 mg | ORAL_TABLET | Freq: Two times a day (BID) | ORAL | Status: DC
  Administered 2020-01-25 – 2020-02-02 (×16): 500 mg via ORAL
  Filled 2020-01-25 (×19): qty 1

## 2020-01-25 MED ORDER — SODIUM CHLORIDE 0.9% FLUSH: 3.0000 mL | INTRAVENOUS | Status: DC | PRN

## 2020-01-25 MED ORDER — OXYCODONE HCL 5 MG PO TABS
5.0000 mg | ORAL_TABLET | ORAL | Status: DC | PRN
  Administered 2020-01-25 – 2020-01-30 (×20): 5 mg via ORAL
  Filled 2020-01-25 (×20): qty 1

## 2020-01-25 MED ORDER — BISACODYL 5 MG PO TBEC
10.0000 mg | DELAYED_RELEASE_TABLET | Freq: Every day | ORAL | Status: DC
  Administered 2020-01-26 – 2020-02-02 (×7): 10 mg via ORAL
  Filled 2020-01-25 (×7): qty 2

## 2020-01-25 NOTE — Progress Notes (Signed)
PROGRESS NOTE    Natalie Thomas  SWN:[id number] DOB: 11-25-1960 DOA: 01/24/2020 PCP: System, Pcp Not In    Brief Narrative:Natalie Thomas is a 59 y.o. female with medical history significant for vulvar cancer, COPD, chronic hypoxic respiratory failure, schizoaffective disorder, chronic hyponatremia, hypertension, and chronic diastolic CHF, now presenting to the emergency department for evaluation of several complaints including nausea, malaise, and abdominal discomfort.  Patient has a difficult time providing history but indicates longstanding cramping discomfort in her abdomen that has recently worsened, nausea, and episode of nonbloody vomiting yesterday morning, and general malaise.  She denies any fevers, worsening shortness of breath or cough, or chest pain.  ED Course: Upon arrival to the ED, patient is found to be afebrile, saturating well on her usual 4 L/min of supplemental oxygen, and with stable blood pressure.  EKG features sinus rhythm with LVH.  Chest x-ray notable for chronic cardiomegaly and vascular congestion with improved pulmonary edema from prior study.  CT of the abdomen and pelvis demonstrates some increase in fat and bowel-containing umbilical hernia but without any obstruction or vascular compromise.  Chemistry panel notable for sodium of 125, down from 130 in May 2021.  CBC unremarkable.  Troponin slightly elevated.  Urinalysis with low specific gravity.  Covid PCR is negative.  Patient was given 150 cc of saline, fentanyl, and Ativan in the ED and hospitalist asked to admit for hyponatremia.  Assessment & Plan:   Principal Problem:   Hyponatremia Active Problems:   DM (diabetes mellitus), type 2 (HCC)   Chronic hyponatremia   COPD (chronic obstructive pulmonary disease) (HCC)   Schizoaffective disorder, bipolar type (HCC)   Chronic respiratory failure with hypoxia (HCC)   Type 2 diabetes mellitus without complication (HCC)   Psychogenic polydipsia   Chronic  diastolic CHF (congestive heart failure) (HCC)     Pressure Injury 10/19/19 Foot Left Deep Tissue Pressure Injury - Purple or maroon localized area of discolored intact skin or blood-filled blister due to damage of underlying soft tissue from pressure and/or shear. (Active)  10/19/19 0340  Location: Foot  Location Orientation: Left  Staging: Deep Tissue Pressure Injury - Purple or maroon localized area of discolored intact skin or blood-filled blister due to damage of underlying soft tissue from pressure and/or shear.  Wound Description (Comments):   Present on Admission: Yes    #1 hypervolemic hyponatremia patient with history of psychogenic polydipsia, continue fluid restriction and Lasix.  Sodium trending up 126. Hold HCTZ  #2 abdominal pain patient reports that she has been complaining of abdominal pain for a long time and is having an abdominal hernia and CT scan suggest a hiatal hernia.  She is complaining of 10 out of 10 abdominal pain requesting morphine.  I will consult surgery and see if they have anything to offer.   CT scan of the abdomen and pelvis-Fat and bowel containing umbilical hernia increased in size from comparison without evidence of resulting mechanical obstruction or vascular compromise at this time.  Mild circumferential bladder wall thickening and bladder distension. Correlate for urinary retention symptoms and consider urinalysis to exclude cystitis.  Small sliding-type hiatal hernia with fluid in the distal thoracic esophagus, correlate for reflux.  Trace left pleural effusion with associated atelectasis in the left lung base.  Cardiomegaly and coronary atherosclerosis with stable small pericardial effusion.  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 Remote compression deformities T11, L1. Likely augmentation  of remote bilateral sacral insufficiency  fractures. Correlate with procedure history. UA on admission urine is colorless trace amount of leukocytes specific gravity is 1.003 low. Lipase 25 LFTs otherwise normal.  White count on admission 8.1 hemoglobin 12.1.  #3 chronic diastolic heart failure continue Lasix ACE inhibitor and beta-blocker.  #4 history of COPD and chronic hypoxic respiratory failure on 4 L of oxygen prior to admission.  #5 she is affective disorder continue Zyprexa Depakote Wellbutrin Klonopin and Latuda.  #6 hypertension on lisinopril metoprolol and Lasix.  Her blood pressure is 133/72.   Estimated body mass index is 37.42 kg/m as calculated from the following:   Height as of this encounter: 5\' 5"  (1.651 m).   Weight as of this encounter: 102 kg.  DVT prophylaxis: lovenox Code Status:dnr Family Communication: none Disposition Plan:    Dispo: The patient is from: Group home              Anticipated d/c is to: Group home              Anticipated d/c date is: > 3 days              Patient currently is not medically stable to d/c.   Consultants: surgery  Procedures: none Antimicrobials:none  Subjective:  C/o 10/10 abdominal pain says no one cares  Asking for morphine No vomiting diarrhea Objective: Vitals:   01/24/20 2342 01/25/20 0553 01/25/20 0625 01/25/20 0745  BP: (!) 161/97 (!) 149/89  (!) 133/72  Pulse: 63 64  62  Resp: 22 18  16   Temp: 97.7 F (36.5 C) 98.2 F (36.8 C)    TempSrc: Oral Oral    SpO2: 100% 100%  91%  Weight:   (!) 102 kg   Height:        Intake/Output Summary (Last 24 hours) at 01/25/2020 1216 Last data filed at 01/25/2020 0434 Gross per 24 hour  Intake 243 ml  Output 1000 ml  Net -757 ml   Filed Weights   01/24/20 1808 01/25/20 0625  Weight: (!) 102 kg (!) 102 kg    Examination:  General exam: Appears calm and comfortable  Respiratory system: diminished at bases to auscultation. Respiratory effort normal. Cardiovascular system: S1 & S2 heard, RRR. No JVD,  murmurs, rubs, gallops or clicks. No pedal edema. Gastrointestinal system: Abdomen is nondistended, soft and nontender. No organomegaly or masses felt. Normal bowel sounds heard. Central nervous system: Alert and oriented. No focal neurological deficits. Extremities: 1 plus edema left more than right bruises left leg Skin: No rashes, lesions or ulcers Psychiatry: Judgement and insight appear normal. Mood & affect appropriate.     Data Reviewed: I have personally reviewed following labs and imaging studies  CBC: Recent Labs  Lab 01/24/20 1849 01/25/20 0425  WBC 8.1 6.3  NEUTROABS 5.9 4.6  HGB 12.1 11.6*  HCT 35.5* 33.2*  MCV 83.9 84.1  PLT 195 563   Basic Metabolic Panel: Recent Labs  Lab 01/24/20 1849 01/25/20 0425  NA 125* 126*  K 4.1 3.8  CL 81* 87*  CO2 31 31  GLUCOSE 116* 140*  BUN 21* 18  CREATININE 0.79 0.77  CALCIUM 9.1 8.7*   GFR: Estimated Creatinine Clearance: 90.8 mL/min (by C-G formula based on SCr of 0.77 mg/dL). Liver Function Tests: Recent Labs  Lab 01/24/20 1849  AST 17  ALT 10  ALKPHOS 60  BILITOT 0.6  PROT 7.9  ALBUMIN 3.8   Recent Labs  Lab 01/24/20 1849  LIPASE 25   No results for input(s): AMMONIA in the last 168 hours. Coagulation Profile: No results for input(s): INR, PROTIME in the last 168 hours. Cardiac Enzymes: No results for input(s): CKTOTAL, CKMB, CKMBINDEX, TROPONINI in the last 168 hours. BNP (last 3 results) No results for input(s): PROBNP in the last 8760 hours. HbA1C: No results for input(s): HGBA1C in the last 72 hours. CBG: No results for input(s): GLUCAP in the last 168 hours. Lipid Profile: No results for input(s): CHOL, HDL, LDLCALC, TRIG, CHOLHDL, LDLDIRECT in the last 72 hours. Thyroid Function Tests: No results for input(s): TSH, T4TOTAL, FREET4, T3FREE, THYROIDAB in the last 72 hours. Anemia Panel: No results for input(s): VITAMINB12, FOLATE, FERRITIN, TIBC, IRON, RETICCTPCT in the last 72  hours. Sepsis Labs: No results for input(s): PROCALCITON, LATICACIDVEN in the last 168 hours.  Recent Results (from the past 240 hour(s))  SARS Coronavirus 2 by RT PCR (hospital order, performed in [facility] hospital lab) Nasopharyngeal Nasopharyngeal Swab     Status: None   Collection Time: 01/24/20  7:49 PM   Specimen: Nasopharyngeal Swab  Result Value Ref Range Status   SARS Coronavirus 2 NEGATIVE NEGATIVE Final    Comment: (NOTE) SARS-CoV-2 target nucleic acids are NOT DETECTED.  The SARS-CoV-2 RNA is generally detectable in upper and lower respiratory specimens during the acute phase of infection. The lowest concentration of SARS-CoV-2 viral copies this assay can detect is 250 copies / mL. A negative result does not preclude SARS-CoV-2 infection and should not be used as the sole basis for treatment or other patient management decisions.  A negative result may occur with improper specimen collection / handling, submission of specimen other than nasopharyngeal swab, presence of viral mutation(s) within the areas targeted by this assay, and inadequate number of viral copies (<250 copies / mL). A negative result must be combined with clinical observations, patient history, and epidemiological information.  Fact Sheet for Patients:   StrictlyIdeas.no  Fact Sheet for Healthcare Providers: BankingDealers.co.za  This test is not yet approved or  cleared by the [place] FDA and has been authorized for detection and/or diagnosis of SARS-CoV-2 by FDA under an Emergency Use Authorization (EUA).  This EUA will remain in effect (meaning this test can be used) for the duration of the COVID-19 declaration under Section 564(b)(1) of the Act, 21 U.S.C. section 360bbb-3(b)(1), unless the authorization is terminated or revoked sooner.  Performed at [facility], [address] [place], [place] [address]   MRSA PCR Screening      Status: Abnormal   Collection Time: 01/25/20  1:37 AM   Specimen: Nasopharyngeal  Result Value Ref Range Status   MRSA by PCR POSITIVE (A) NEGATIVE Final    Comment:        The GeneXpert MRSA Assay (FDA approved for NASAL specimens only), is one component of a comprehensive MRSA colonization surveillance program. It is not intended to diagnose MRSA infection nor to guide or monitor treatment for MRSA infections. RESULT CALLED TO, READ BACK BY AND VERIFIED WITH: stacey clay at [place] on 01/25/20 rww Performed at [facility], [address] [place],  [address]          Radiology Studies: CT ABDOMEN PELVIS W CONTRAST  Result Date: 01/24/2020 CLINICAL DATA:  Abdominal pain EXAM: CT ABDOMEN AND PELVIS WITH CONTRAST TECHNIQUE: Multidetector CT imaging of the abdomen and pelvis was performed using the standard protocol following bolus administration of intravenous contrast. CONTRAST:  165mL OMNIPAQUE IOHEXOL 300  MG/ML  SOLN COMPARISON:  CT 07/01/2019 FINDINGS: Lower chest: Trace left pleural effusion with associated atelectasis in the left lung base. Right basilar atelectasis noted as well. Mild cardiomegaly with coronary artery calcifications and a small pericardial effusion. Hepatobiliary: No focal liver abnormality is seen. Patient is post cholecystectomy. Slight prominence of the biliary tree likely related to reservoir effect. No calcified intraductal gallstones. Pancreas: Unremarkable. No pancreatic ductal dilatation or surrounding inflammatory changes. Spleen: Normal in size. No concerning splenic lesions. Adrenals/Urinary Tract: Normal adrenal glands. Kidneys enhance and excrete symmetrically. Few subcentimeter hypoattenuating foci too small to fully characterize on CT imaging but statistically likely benign. Stable mild bilateral perinephric stranding, nonspecific. No concerning renal mass. Bilateral nonobstructing nephrolithiasis as well as vascular calcifications.  Scarring seen in the interpolar right kidney. No obstructive urolithiasis or hydronephrosis. Mild bladder distention with circumferential bladder wall thickening and indentation of the bladder base by an enlarged prostate. Stomach/Bowel: Small sliding-type hiatal hernia with fluid in the distal thoracic esophagus, correlate for reflux. Stomach and duodenum are unremarkable. No proximal small bowel thickening or dilatation. Loops of small bowel protrude into a fat and bowel containing umbilical hernia. No resulting mechanical obstruction or evidence of vascular compromise at this time. No colonic dilatation or wall thickening. Scattered colonic diverticula without focal inflammation to suggest diverticulitis. Vascular/Lymphatic: Extensive atherosclerotic calcifications within the abdominal aorta and branch vessels including exuberant calcification in the mid abdominal aorta below the renal artery origins which may result in some high-grade stenosis (2/30). Additional plaque narrowing of the celiac, SMA, renal artery and IMA origins is likely present though incompletely assessed on this non angiographic technique. No aneurysm or ectasia. No enlarged abdominopelvic lymph nodes. Reproductive: Anteverted uterus.  No concerning adnexal lesions. Other: Fat and bowel containing umbilical hernia increased in size from comparison without evidence of resulting mechanical obstruction or vascular compromise at this time. Mild edematous changes of the bilateral flanks. Soft tissue nodules in the subcutaneous fat of the anterolateral abdominal wall may reflect injectable use. Musculoskeletal: Multilevel degenerative changes are present in the imaged portions of the spine. Unchanged compression deformities at T11 and L1 as well as the superior endplate L3. Exaggerated lumbar lordosis similar to comparison. Hyperdense changes at the SI joints may reflect augmentation of prior insufficiency fractures. 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 Additional remote posttraumatic deformities of the right superior pubic ramus. IMPRESSION: 1. Fat and bowel containing umbilical hernia increased in size from comparison without evidence of resulting mechanical obstruction or vascular compromise at this time. 2. Mild circumferential bladder wall thickening and bladder distension. Correlate for urinary retention symptoms and consider urinalysis to exclude cystitis. 3. Small sliding-type hiatal hernia with fluid in the distal thoracic esophagus, correlate for reflux. 4. Trace left pleural effusion with associated atelectasis in the left lung base. 5. Cardiomegaly and coronary atherosclerosis with stable small pericardial effusion. 6. 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 7. Remote compression deformities T11, L1. Likely augmentation of remote bilateral sacral insufficiency fractures. Correlate with procedure history. 8. Aortic Atherosclerosis (ICD10-I70.0). Electronically Signed   By: Lovena Le M.D.   On: 01/24/2020 21:44   DG Chest Portable 1 View  Result Date: 01/24/2020 CLINICAL DATA:  Chest pain.  Abdominal pain today. EXAM: PORTABLE CHEST 1 VIEW COMPARISON:  Chest radiograph 10/18/2019 FINDINGS: Chronic cardiomegaly with slight improvement from  prior exam. Aortic atherosclerosis. Vascular congestion. Improved pulmonary edema from prior exam. There is mild central peribronchial thickening. No large pleural effusion. No evidence of pneumothorax or focal airspace disease. IMPRESSION: Chronic cardiomegaly with vascular congestion. Improved pulmonary edema from prior exam. Mild central peribronchial thickening may be bronchitic or congestive. Electronically Signed   By: Keith Rake M.D.   On: 01/24/2020 18:59         Scheduled Meds: . buPROPion  150 mg Oral Daily  . Chlorhexidine Gluconate Cloth  6 each Topical Q0600  . clonazePAM  0.5 mg Oral BID  . divalproex  250 mg Oral BID  . docusate sodium  100 mg Oral BID  . enoxaparin (LOVENOX) injection  40 mg Subcutaneous Q24H  . fluticasone furoate-vilanterol  1 puff Inhalation Daily  . furosemide  20 mg Oral BID  . ipratropium-albuterol  3 mL Nebulization TID  . lisinopril  10 mg Oral Daily  . lurasidone  20 mg Oral BID  . metoprolol succinate  12.5 mg Oral Daily  . mupirocin ointment  1 application Nasal BID  . nicotine  21 mg Transdermal Daily  . NIFEdipine  60 mg Oral Daily  . OLANZapine  10 mg Oral BID  . pantoprazole  40 mg Oral Daily  . polyethylene glycol  17 g Oral Daily  . sodium chloride flush  3 mL Intravenous Q12H  . sucralfate  1 g Oral TID WC & HS   Continuous Infusions: . sodium chloride       LOS: 0 days     Georgette Shell, MD  01/25/2020, 12:16 PM
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
## 2020-01-25 NOTE — Consult Note (Signed)
SURGICAL CONSULTATION NOTE   HISTORY OF PRESENT ILLNESS (HPI):  59 y.o. female presented to [facility] ED for evaluation of abdominal pain. Patient unable to give a coherent history comes abdominal pain.  Patient cannot specify when the pain started.  She reports that she has been having abdominal pain for a long time.  Patient reports that the pain is the middle of the abdomen.  She does not report any pain radiation to other part of the body.  Patient cannot specify any alleviating or aggravating factors.  Patient during our conversation is focus most of the time saying that she does not want to hurt anybody and she does not want to be hurt.  She was admitted with hyponatremia.  Due to the complaint of abdominal pain CT scan abdomen was done.  This showed an umbilical hernia.  Otherwise no sign of complication.  I personally evaluated the images.  Surgery is consulted by Dr. Zigmund Daniel in this context for evaluation and management of ventral hernia.  PAST MEDICAL HISTORY (PMH):  Past Medical History:  Diagnosis Date  . Anemia 10/16/2017  . Anxiety and depression   . CHF (congestive heart failure) ([place])   . Chronic hyponatremia 04/08/2007   Qualifier: Diagnosis of  By: Marca Ancona RMA, Lucy    . Closed comminuted intertrochanteric fracture of right femur ([place]) 11/07/2017  . COPD 04/08/2007   Qualifier: Diagnosis of  By: Marca Ancona RMA, Lucy    . Depression   . Diabetes mellitus, type 2 ([place])    pt denies but states that she has been treated for DM  . Essential hypertension 04/08/2007   Qualifier: Diagnosis of  By: Marca Ancona RMA, Lucy    . GERD (gastroesophageal reflux disease)   . Hyperlipidemia 10/07/2017  . Lumbar disc disease 04/09/2011  . MENOPAUSE, PREMATURE 04/08/2007   Qualifier: Diagnosis of  By: Marca Ancona RMA, Lucy    . 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 Osteoporosis 04/08/2007   Qualifier: Diagnosis of  By: Reatha Armour, Lucy    . Polycythemia  secondary to smoking 04/09/2011  . 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 Schizoaffective disorder, bipolar type ([place]) 07/05/2011     PAST SURGICAL HISTORY ([place]):  Past Surgical History:  Procedure Laterality Date  . BIOPSY  07/11/2019   Procedure: BIOPSY;  Surgeon: Jerene Bears, MD;  Location: [facility] ENDOSCOPY;  Service: Gastroenterology;;  . ESOPHAGOGASTRODUODENOSCOPY (EGD) WITH PROPOFOL N/A 07/11/2019   Procedure: ESOPHAGOGASTRODUODENOSCOPY (EGD) WITH PROPOFOL;  Surgeon: Jerene Bears, MD;  Location: [facility];  Service: Gastroenterology;  Laterality: N/A;  . EYE SURGERY     on left  eye, pt states that she sees bad out of the right eye and needs surgery there  . INTRAMEDULLARY (IM) NAIL INTERTROCHANTERIC Right 11/13/2017   Procedure: INTRAMEDULLARY (IM) NAIL INTERTROCHANTRIC;  Surgeon: Shona Needles, MD;  Location: [place];  Service: Orthopedics;  Laterality: Right;  . PELVIC FRACTURE SURGERY       MEDICATIONS:  Prior to Admission medications   Medication Sig Start Date End Date Taking? Authorizing Provider  acetaminophen (TYLENOL) 500 MG tablet Take 1,000 mg by mouth every 8 (eight) hours as needed for mild pain or moderate pain. Not to exceed 3g/24h of tylenol from all sources.   Yes [provider]  albuterol (PROVENTIL) (2.5 MG/3ML) 0.083% nebulizer solution Take 3 mLs (2.5 mg total) by nebulization every 4 (four) hours as needed for wheezing or shortness of breath. 12/11/18  Yes Swayze, Ava, DO  albuterol (VENTOLIN HFA) 108 (90 Base) MCG/ACT inhaler Inhale 2 puffs into the lungs every 6 (six) hours as needed for wheezing or shortness of breath.   Yes [provider]  Alum & Mag Hydroxide-Simeth (ANTACID LIQUID PO) Take 30 mLs by mouth every 6 (six) hours as needed (heartburn).   Yes [provider]  ascorbic acid (VITAMIN C) 1000 MG tablet Take 1 tablet (1,000 mg total) by mouth daily. 09/17/19  Yes Lorella Nimrod, MD  buPROPion (WELLBUTRIN XL) 150 MG 24 hr tablet  Take 150 mg by mouth daily.   Yes [provider]  Calcium Carb-Cholecalciferol 600-400 MG-UNIT TABS Take 1 tablet by mouth daily.   Yes [provider]  chlorthalidone (HYGROTON) 50 MG tablet Take 1 tablet (50 mg total) by mouth daily. 10/26/19  Yes Swayze, Ava, DO  clonazePAM (KLONOPIN) 1 MG tablet Take 0.5 tablets (0.5 mg total) by mouth 2 (two) times daily. 10/25/19  Yes Swayze, Ava, DO  divalproex (DEPAKOTE ER) 250 MG 24 hr tablet Take 250 mg by mouth 2 (two) times daily.   Yes [provider]  docusate sodium (COLACE) 100 MG capsule Take 100 mg by mouth 2 (two) times daily.    Yes [provider]  fluticasone furoate-vilanterol (BREO ELLIPTA) 100-25 MCG/INH AEPB Inhale 1 puff into the lungs daily.   Yes [provider]  furosemide (LASIX) 20 MG tablet Take 3 tablets (60 mg total) by mouth 2 (two) times daily. Patient taking differently: Take 20 mg by mouth 2 (two) times daily.  10/25/19  Yes Swayze, Ava, DO  guaiFENesin (ROBITUSSIN) 100 MG/5ML SOLN Take 15 mLs by mouth every 4 (four) hours as needed for cough or to loosen phlegm.   Yes [provider]  hydrOXYzine (ATARAX/VISTARIL) 25 MG tablet Take 25 mg by mouth every 6 (six) hours as needed for anxiety or itching.   Yes [provider]  ipratropium-albuterol (DUONEB) 0.5-2.5 (3) MG/3ML SOLN Take 3 mLs by nebulization 3 (three) times daily. 10/25/19  Yes Swayze, Ava, DO  lisinopril (ZESTRIL) 10 MG tablet Take 1 tablet (10 mg total) by mouth daily. 10/26/19  Yes Swayze, Ava, DO  loratadine (CLARITIN) 10 MG tablet Take 10 mg by mouth daily.   Yes [provider]  lurasidone (LATUDA) 20 MG TABS tablet Take 1 tablet (20 mg total) by mouth 2 (two) times daily. 05/15/19  Yes Hongalgi, Lenis Dickinson, MD  magnesium hydroxide (MILK OF MAGNESIA) 400 MG/5ML suspension Take 30 mLs by mouth daily as needed for mild constipation or moderate constipation. 10/25/19  Yes Swayze, Ava, DO  Melatonin 5 MG  TABS Take 5 mg by mouth at bedtime.   Yes [provider]  metoprolol succinate (TOPROL-XL) 25 MG 24 hr tablet Take 0.5 tablets (12.5 mg total) by mouth daily. 10/26/19  Yes Swayze, Ava, DO  Multiple Vitamin (MULTIVITAMIN WITH MINERALS) TABS tablet Take 1 tablet by mouth daily. 03/15/19  Yes Oretha Milch D, MD  neomycin-bacitracin-polymyxin (NEOSPORIN) ointment Apply 1 application topically as needed for wound care.   Yes [provider]  nicotine (NICODERM CQ - DOSED IN MG/24 HOURS) 21 mg/24hr patch Place 21 mg onto the skin daily.    Yes [provider]  NIFEdipine (ADALAT CC) 60 MG 24 hr tablet Take 1 tablet (60 mg total) by mouth daily. 10/26/19  Yes Swayze, Ava, DO  nystatin (NYSTATIN) powder Apply 1 application topically 2 (two) times daily as needed (irritation). Apply topically to reddened  area twice daily as needed for irritation   Yes [provider]  OLANZapine (ZYPREXA) 10 MG tablet Take 10 mg by mouth 2 (two) times daily.   Yes [provider]  ondansetron (ZOFRAN) 8 MG tablet Take 8 mg by mouth every 6 (six) hours as needed for nausea or vomiting.   Yes [provider]  pantoprazole (PROTONIX) 40 MG tablet Take 1 tablet (40 mg total) by mouth daily. 07/16/19 01/24/20 Yes Mercy Riding, MD  polyethylene glycol (MIRALAX) 17 g packet Take 17 g by mouth daily. Patient taking differently: Take 17 g by mouth daily. Mix 1 capful (17 g) in 4-8 oz of water or juice and give by mouth once daily for bowel regimen 12/11/18  Yes Swayze, Ava, DO  sodium chloride (OCEAN) 0.65 % SOLN nasal spray Place 2 sprays into both nostrils every 2 (two) hours as needed (dryness, irritation).   Yes [provider]  sucralfate (CARAFATE) 1 GM/10ML suspension Take 10 mLs (1 g total) by mouth 4 (four) times daily -  with meals and at bedtime. 07/16/19  Yes Mercy Riding, MD  aspirin EC 81 MG EC tablet Take 1 tablet (81 mg total) by mouth daily. Patient not taking:  Reported on 01/24/2020 10/26/19   Swayze, Ava, DO  feeding supplement, ENSURE ENLIVE, (ENSURE ENLIVE) LIQD Take 237 mLs by mouth daily. Patient not taking: Reported on 01/24/2020 03/14/19   Desiree Hane, MD     ALLERGIES:  Allergies  Allergen Reactions  . Clarithromycin Itching  . Penicillins Itching    Has tolerated cefazolin before  Has patient had a PCN reaction causing immediate rash, facial/tongue/throat swelling, SOB or lightheadedness with hypotension: No Has patient had a PCN reaction causing severe rash involving mucus membranes or skin necrosis: No Has patient had a PCN reaction that required hospitalization: Unknown Has patient had a PCN reaction occurring within the last 10 years: No If all of the above answers are "NO", then may proceed with Cephalosporin use.      SOCIAL HISTORY:  Social History   Socioeconomic History  . Marital status: Divorced    Spouse name: Not on file  . Number of children: Not on file  . Years of education: Not on file  . Highest education level: Not on file  Occupational History  . Occupation: Disabled  Tobacco Use  . Smoking status: Former Smoker    Packs/day: 0.25    Types: Cigarettes    Quit date: 07/23/2019    Years since quitting: 0.5  . Smokeless tobacco: Never Used  Vaping Use  . Vaping Use: Never used  Substance and Sexual Activity  . Alcohol use: No  . Drug use: No  . Sexual activity: Never  Other Topics Concern  . Not on file  Social History Narrative   single   Social Determinants of Health   Financial Resource Strain:   . Difficulty of Paying Living Expenses:   Food Insecurity:   . Worried About Charity fundraiser in the Last Year:   . Arboriculturist in the Last Year:   Transportation Needs:   . Film/video editor (Medical):   Marland Kitchen Lack of Transportation (Non-Medical):   Physical Activity:   . Days of Exercise per Week:   . Minutes of Exercise per Session:   Stress:   . Feeling of Stress :   Social  Connections:   . Frequency of Communication with Friends and Family:   . Frequency  of Social Gatherings with Friends and Family:   . Attends Religious Services:   . Active Member of Clubs or Organizations:   . Attends Archivist Meetings:   Marland Kitchen Marital Status:   Intimate Partner Violence:   . Fear of Current or Ex-Partner:   . Emotionally Abused:   Marland Kitchen Physically Abused:   . Sexually Abused:       FAMILY HISTORY:  History reviewed. No pertinent family history.   REVIEW OF SYSTEMS:  Constitutional: denies weight loss, fever, chills, or sweats  Eyes: denies any other vision changes, history of eye injury  ENT: denies sore throat, hearing problems  Respiratory: Positive shortness of breath, wheezing  Cardiovascular: denies chest pain, palpitations  Gastrointestinal: Positive abdominal pain, nausea and vomiting Genitourinary: denies burning with urination or urinary frequency Musculoskeletal: denies any other joint pains or cramps  Skin: denies any other rashes or skin discolorations  Neurological: denies any other headache, dizziness, weakness  Psychiatric: Positive any other depression, anxiety   All other review of systems were negative   VITAL SIGNS:  Temp:  [97.7 F (36.5 C)-98.2 F (36.8 C)] 98.2 F (36.8 C) (08/02 0553) Pulse Rate:  [62-88] 62 (08/02 0745) Resp:  [16-22] 16 (08/02 0745) BP: (123-166)/(72-98) 133/72 (08/02 0745) SpO2:  [91 %-100 %] 97 % (08/02 1309) Weight:  [102 kg] 102 kg (08/02 0625)     Height: 5\' 5"  (165.1 cm) Weight: (!) 102 kg BMI (Calculated): 37.42   INTAKE/OUTPUT:  This shift: No intake/output data recorded.  Last 2 shifts: @IOLAST2SHIFTS @   PHYSICAL EXAM:  Constitutional:  --Obese -- Awake, alert, teary and worried Eyes:  -- Pupils equally round and reactive to light  -- No scleral icterus  Ear, nose, and throat:  -- No jugular venous distension  Pulmonary:  -- No crackles  -- Equal breath sounds bilaterally -- Breathing  non-labored at rest Cardiovascular:  -- S1, S2 present  -- No pericardial rubs Gastrointestinal:  -- Abdomen soft, nontender, non-distended, no guarding or rebound tenderness --Due to the obesity I was not able to palpate the hernia defect no sign of incarcerated hernia. Musculoskeletal and Integumentary:  -- Wounds: None appreciated -- Extremities: B/L UE and LE FROM, hands and feet warm, no edema  Neurologic:  -- Motor function: intact and symmetric -- Sensation: intact and symmetric   Labs:  CBC Latest Ref Rng & Units 01/25/2020 01/24/2020 10/26/2019  WBC 4.0 - 10.5 K/uL 6.3 8.1 10.5  Hemoglobin 12.0 - 15.0 g/dL 11.6(L) 12.1 10.6(L)  Hematocrit 36 - 46 % 33.2(L) 35.5(L) 32.4(L)  Platelets 150 - 400 K/uL 168 195 282   CMP Latest Ref Rng & Units 01/25/2020 01/24/2020 10/25/2019  Glucose 70 - 99 mg/dL 140(H) 116(H) 120(H)  BUN 6 - 20 mg/dL 18 21(H) 71(H)  Creatinine 0.44 - 1.00 mg/dL 0.77 0.79 0.95  Sodium 135 - 145 mmol/L 126(L) 125(L) 130(L)  Potassium 3.5 - 5.1 mmol/L 3.8 4.1 4.9  Chloride 98 - 111 mmol/L 87(L) 81(L) 81(L)  CO2 22 - 32 mmol/L 31 31 40(H)  Calcium 8.9 - 10.3 mg/dL 8.7(L) 9.1 8.6(L)  Total Protein 6.5 - 8.1 g/dL - 7.9 -  Total Bilirubin 0.3 - 1.2 mg/dL - 0.6 -  Alkaline Phos 38 - 126 U/L - 60 -  AST 15 - 41 U/L - 17 -  ALT 0 - 44 U/L - 10 -    Imaging studies:  EXAM: CT ABDOMEN AND PELVIS WITH CONTRAST  TECHNIQUE: Multidetector CT imaging of the  abdomen and pelvis was performed using the standard protocol following bolus administration of intravenous contrast.  CONTRAST:  138mL OMNIPAQUE IOHEXOL 300 MG/ML  SOLN  COMPARISON:  CT 07/01/2019  FINDINGS: Lower chest: Trace left pleural effusion with associated atelectasis in the left lung base. Right basilar atelectasis noted as well. Mild cardiomegaly with coronary artery calcifications and a small pericardial effusion.  Hepatobiliary: No focal liver abnormality is seen. Patient is  post cholecystectomy. Slight prominence of the biliary tree likely related to reservoir effect. No calcified intraductal gallstones.  Pancreas: Unremarkable. No pancreatic ductal dilatation or surrounding inflammatory changes.  Spleen: Normal in size. No concerning splenic lesions.  Adrenals/Urinary Tract: Normal adrenal glands. Kidneys enhance and excrete symmetrically. Few subcentimeter hypoattenuating foci too small to fully characterize on CT imaging but statistically likely benign. Stable mild bilateral perinephric stranding, nonspecific. No concerning renal mass. Bilateral nonobstructing nephrolithiasis as well as vascular calcifications. Scarring seen in the interpolar right kidney. No obstructive urolithiasis or hydronephrosis. Mild bladder distention with circumferential bladder wall thickening and indentation of the bladder base by an enlarged prostate.  Stomach/Bowel: Small sliding-type hiatal hernia with fluid in the distal thoracic esophagus, correlate for reflux. Stomach and duodenum are unremarkable. No proximal small bowel thickening or dilatation. Loops of small bowel protrude into a fat and bowel containing umbilical hernia. No resulting mechanical obstruction or evidence of vascular compromise at this time. No colonic dilatation or wall thickening. Scattered colonic diverticula without focal inflammation to suggest diverticulitis.  Vascular/Lymphatic: Extensive atherosclerotic calcifications within the abdominal aorta and branch vessels including exuberant calcification in the mid abdominal aorta below the renal artery origins which may result in some high-grade stenosis (2/30). Additional plaque narrowing of the celiac, SMA, renal artery and IMA origins is likely present though incompletely assessed on this non angiographic technique. No aneurysm or ectasia. No enlarged abdominopelvic lymph nodes.  Reproductive: Anteverted uterus.  No concerning adnexal  lesions.  Other: Fat and bowel containing umbilical hernia increased in size from comparison without evidence of resulting mechanical obstruction or vascular compromise at this time. Mild edematous changes of the bilateral flanks. Soft tissue nodules in the subcutaneous fat of the anterolateral abdominal wall may reflect injectable use.  Musculoskeletal: Multilevel degenerative changes are present in the imaged portions of the spine. Unchanged compression deformities at T11 and L1 as well as the superior endplate L3. Exaggerated lumbar lordosis similar to comparison. Hyperdense changes at the SI joints may reflect augmentation of prior insufficiency fractures. 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 Additional remote posttraumatic deformities of the right superior pubic ramus.  IMPRESSION: 1. Fat and bowel containing umbilical hernia increased in size from comparison without evidence of resulting mechanical obstruction or vascular compromise at this time. 2. Mild circumferential bladder wall thickening and bladder distension. Correlate for urinary retention symptoms and consider urinalysis to exclude cystitis. 3. Small sliding-type hiatal hernia with fluid in the distal thoracic esophagus, correlate for reflux. 4. Trace left pleural effusion with associated atelectasis in the left lung base. 5. Cardiomegaly and coronary atherosclerosis with stable small pericardial effusion. 6. 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 7. Remote compression deformities T11, L1. Likely augmentation of remote bilateral sacral insufficiency fractures. Correlate with procedure history. 8. Aortic Atherosclerosis (ICD10-I70.0).   Electronically Signed   By: Lovena Le M.D.   On:  01/24/2020 21:44  Assessment/Plan:  59 y.o. female with ventral hernia, complicated by pertinent comorbidities including CHF, COPD, diabetes, hypertension, schizophrenia, bipolar disorder, needing assistance for basic ADLs.  Patient consulted for evaluation of ventral hernia.  The main reason for inpatient consult for the ventral hernia sac patient is complaining of 10 out of 10 in the abdomen.  During my evaluation the patient is mainly fixated during the conversations stating that she does not want to hurt anybody and she does not want to get hurt.  She does mention abdominal pain.  Unable to pinpoint exactly where the pain is where she said mainly in the center of the abdomen.  Due to the difficulty of the physical exam due to obesity and the difficulty of the history I did an extensive chart evaluation.  The patient has been going to the hospital in the last 2 years pretty frequently.  Most of the admission has been due to hyponatremia and abdominal pain.  This is basically the 2 reason that she is admitted at this time.  She was previously evaluated by [facility] surgeons for this hernia and due to patient clinical status it was not recommended to have any repair.  My assessment is that the patient has been deteriorating clinically during the last year.  If you see her multiple admissions to the hospital which initially were mainly due to hyponatremia and abdominal pain they have been getting more complicated with exacerbation of COPD and CHF.  As an example, in April 2021 she was just using 2 L of oxygen supplement.  At this admission when she came into the hospital it was reported that the patient is in her usual 4 L per oxygen.  Meaning that the patient is worsening her COPD and/or her CHF.  Also I was not able to see how this patient has been managed as outpatient to keep her COPD has CHF under control.  There are multiple telephone encounters from [place] [facility] but I  was not able to see any MD note.  I did not find any pulmonary note either.  At this moment I do not think that this patient is having pain 10 out of 10 from the hernia seen on the CT scan.  I think that there is a combination of psychological, social and medical issues that are contributing to this patient complaints.  I think that at this moment considering surgery will be a higher risk to do harm the benefit.  The patient is unable to understand what dose hernia means, the surgical management or its risk.  I do not think that this patient pain is gone to improve with the repair of this hernia at this moment.  It can even aggravate her pain and if there is any complication will definitely affect negatively her quality of life which is already very difficult for her and her caregivers.  I do not recommend any surgical management at this moment.  Continue medical management.  We will sign off.   Arnold Long, MD

## 2020-01-25 NOTE — Progress Notes (Signed)
Patient complaining of severe pain and requesting stronger pain medication. Appropriate orders were placed.  Natalie Thomas  01/25/2020  3:17 AM

## 2020-01-25 NOTE — Consult Note (Signed)
[facility] Face-to-Face Psychiatry Consult   Reason for Consult: Patient with a history of chronic mental health problems came into the hospital anxious dysphoric and requesting to see psychiatry Referring Physician: Zigmund Daniel Patient Identification: Natalie Thomas MRN:  [id number] Principal Diagnosis: Schizoaffective disorder, bipolar type ([place]) Diagnosis:  Principal Problem:   Schizoaffective disorder, bipolar type ([place]) Active Problems:   DM (diabetes mellitus), type 2 ([place])   Chronic hyponatremia   COPD (chronic obstructive pulmonary disease) ([place])   Chronic respiratory failure with hypoxia ([place])   Type 2 diabetes mellitus without complication (HCC)   Hyponatremia   Psychogenic polydipsia   Chronic diastolic CHF (congestive heart failure) ([place])   Total Time spent with patient: 1 hour  Subjective:   Natalie Thomas is a 59 y.o. female patient admitted with "I had a nervous breakdown".  HPI: Patient seen and chart reviewed.  59 year old woman with a longstanding chronic mental health problem brought to the hospital with complaints of abdominal pain.  Patient is not a very good historian.  It is very difficult to get her to stay on topic and she has multiple shifting complaints rather than 1 chief complaint.  Patient does say that she came to the hospital because she was feeling bad inside.  However, she cannot really give me a clear explanation for this.  She mentions several times "I had a nervous breakdown" referring to what happened to her in her 77s.  This is a typical thing for her to perseverate on.  Patient reports being in a bad mood but quickly changes the topic to how she does not want to go back to [place].  She denied hallucinations but later told me that she felt like she heard her name being called and that they were talking about her on the TV.  She denied suicidal or homicidal ideation.  Patient reports her mood is being anxious and dysphoric but denies any  suicidal intent or plan.  Patient is unable to give me any clear history about whether she feels that her medicine has been better or worse for her.  Apparently she has recently been staying at [facility] for rehabilitation from something.  Prior to that I assume that she was still at her usual group home.  No recent alcohol or drug abuse.  Currently medications are pretty similar to what she is usually on.  She is on at least 2 antipsychotics at this time.  Past Psychiatric History: Past history of chronic mental health problems going back to her 28s.  Carries a diagnosis of schizoaffective disorder.  Patient perseverates on this and tells me several times that her outpatient psychiatrist told her that she did not have schizoaffective disorder but she cannot tell me what she supposedly had otherwise.  She does not have a known history of suicide attempts but has [facility].  At one time when we first met her in 2013 she had been doing reasonably well on lithium but was switched to Depakote in part in response to her persistent hyponatremia.  Nevertheless she seems to have done well on Depakote and antipsychotics also.  I am not able to really identify any pattern from her chart as to whether medicines impact her outcome much.  Risk to Self:   Risk to Others:   Prior Inpatient Therapy:   Prior Outpatient Therapy:    Past Medical History:  Past Medical History:  Diagnosis Date  . Anemia 10/16/2017  .  Anxiety and depression   . CHF (congestive heart failure) ([place])   . Chronic hyponatremia 04/08/2007   Qualifier: Diagnosis of  By: Marca Ancona RMA, Lucy    . Closed comminuted intertrochanteric fracture of right femur ([place]) 11/07/2017  . COPD 04/08/2007   Qualifier: Diagnosis of  By: Marca Ancona RMA, Lucy    . Depression   . Diabetes mellitus, type 2 ([place])    pt denies but states that she has been treated for DM  . Essential hypertension 04/08/2007    Qualifier: Diagnosis of  By: Marca Ancona RMA, Lucy    . GERD (gastroesophageal reflux disease)   . Hyperlipidemia 10/07/2017  . Lumbar disc disease 04/09/2011  . MENOPAUSE, PREMATURE 04/08/2007   Qualifier: Diagnosis of  By: Marca Ancona RMA, Lucy    . 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 Osteoporosis 04/08/2007   Qualifier: Diagnosis of  By: Reatha Armour, Lucy    . Polycythemia secondary to smoking 04/09/2011  . 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 Schizoaffective disorder, bipolar type ([place]) 07/05/2011    Past Surgical History:  Procedure Laterality Date  . BIOPSY  07/11/2019   Procedure: BIOPSY;  Surgeon: Jerene Bears, MD;  Location: [facility] ENDOSCOPY;  Service: Gastroenterology;;  . ESOPHAGOGASTRODUODENOSCOPY (EGD) WITH PROPOFOL N/A 07/11/2019   Procedure: ESOPHAGOGASTRODUODENOSCOPY (EGD) WITH PROPOFOL;  Surgeon: Jerene Bears, MD;  Location: [place];  Service: Gastroenterology;  Laterality: N/A;  . EYE SURGERY     on left  eye, pt states that she sees bad out of the right eye and needs surgery there  . INTRAMEDULLARY (IM) NAIL INTERTROCHANTERIC Right 11/13/2017   Procedure: INTRAMEDULLARY (IM) NAIL INTERTROCHANTRIC;  Surgeon: Shona Needles, MD;  Location: [facility];  Service: Orthopedics;  Laterality: Right;  . PELVIC FRACTURE SURGERY     Family History: History reviewed. No pertinent family history. Family Psychiatric  History: None reported Social History:  Social History   Substance and Sexual Activity  Alcohol Use No     Social History   Substance and Sexual Activity  Drug Use No    Social History   Socioeconomic History  . Marital status: Divorced    Spouse name: Not on file  . Number of children: Not on file  . Years of education: Not on file  . Highest education level: Not on file  Occupational History  . Occupation: Disabled  Tobacco Use  . Smoking status: Former Smoker    Packs/day: 0.25    Types: Cigarettes    Quit  date: 07/23/2019    Years since quitting: 0.5  . Smokeless tobacco: Never Used  Vaping Use  . Vaping Use: Never used  Substance and Sexual Activity  . Alcohol use: No  . Drug use: No  . Sexual activity: Never  Other Topics Concern  . Not on file  Social History Narrative   single   Social Determinants of Health   Financial Resource Strain:   . Difficulty of Paying Living Expenses:   Food Insecurity:   . Worried About Charity fundraiser in the Last Year:   . Arboriculturist in the Last Year:   Transportation Needs:   . Film/video editor (Medical):   Marland Kitchen Lack of Transportation (Non-Medical):   Physical Activity:   . Days of Exercise per Week:   . Minutes of Exercise per Session:   Stress:   . Feeling of Stress :   Social Connections:   .  Frequency of Communication with Friends and Family:   . Frequency of Social Gatherings with Friends and Family:   . Attends Religious Services:   . Active Member of Clubs or Organizations:   . Attends Archivist Meetings:   Marland Kitchen Marital Status:    Additional Social History:    Allergies:   Allergies  Allergen Reactions  . Clarithromycin Itching  . Penicillins Itching    Has tolerated cefazolin before  Has patient had a PCN reaction causing immediate rash, facial/tongue/throat swelling, SOB or lightheadedness with hypotension: No Has patient had a PCN reaction causing severe rash involving mucus membranes or skin necrosis: No Has patient had a PCN reaction that required hospitalization: Unknown Has patient had a PCN reaction occurring within the last 10 years: No If all of the above answers are "NO", then may proceed with Cephalosporin use.     Labs:  Results for orders placed or performed during the hospital encounter of 01/24/20 (from the past 48 hour(s))  CBC with Differential     Status: Abnormal   Collection Time: 01/24/20  6:49 PM  Result Value Ref Range   WBC 8.1 4.0 - 10.5 K/uL   RBC 4.23 3.87 - 5.11 MIL/uL    Hemoglobin 12.1 12.0 - 15.0 g/dL   HCT 35.5 (L) 36 - 46 %   MCV 83.9 80.0 - 100.0 fL   MCH 28.6 26.0 - 34.0 pg   MCHC 34.1 30.0 - 36.0 g/dL   RDW 13.2 11.5 - 15.5 %   Platelets 195 150 - 400 K/uL   nRBC 0.0 0.0 - 0.2 %   Neutrophils Relative % 74 %   Neutro Abs 5.9 1.7 - 7.7 K/uL   Lymphocytes Relative 17 %   Lymphs Abs 1.4 0.7 - 4.0 K/uL   Monocytes Relative 8 %   Monocytes Absolute 0.7 0 - 1 K/uL   Eosinophils Relative 1 %   Eosinophils Absolute 0.1 0 - 0 K/uL   Basophils Relative 0 %   Basophils Absolute 0.0 0 - 0 K/uL   Immature Granulocytes 0 %   Abs Immature Granulocytes 0.03 0.00 - 0.07 K/uL    Comment: Performed at [facility], [address] [place], [place] [address]  Comprehensive metabolic panel     Status: Abnormal   Collection Time: 01/24/20  6:49 PM  Result Value Ref Range   Sodium 125 (L) 135 - 145 mmol/L   Potassium 4.1 3.5 - 5.1 mmol/L   Chloride 81 (L) 98 - 111 mmol/L   CO2 31 22 - 32 mmol/L   Glucose, Bld 116 (H) 70 - 99 mg/dL    Comment: Glucose reference range applies only to samples taken after fasting for at least 8 hours.   BUN 21 (H) 6 - 20 mg/dL   Creatinine, Ser 0.79 0.44 - 1.00 mg/dL   Calcium 9.1 8.9 - 10.3 mg/dL   Total Protein 7.9 6.5 - 8.1 g/dL   Albumin 3.8 3.5 - 5.0 g/dL   AST 17 15 - 41 U/L   ALT 10 0 - 44 U/L   Alkaline Phosphatase 60 38 - 126 U/L   Total Bilirubin 0.6 0.3 - 1.2 mg/dL   GFR calc non Af Amer >60 >60 mL/min   GFR calc Af Amer >60 >60 mL/min   Anion gap 13 5 - 15    Comment: Performed at [facility], [address] [place], [place] [address]  Lipase, blood     Status: None  Collection Time: 01/24/20  6:49 PM  Result Value Ref Range   Lipase 25 11 - 51 U/L    Comment: Performed at [facility], [place], [place] [address]  Troponin I (High Sensitivity)     Status: Abnormal   Collection Time: 01/24/20  6:49 PM  Result Value Ref Range   Troponin I (High Sensitivity)  18 (H) <18 ng/L    Comment: (NOTE) Elevated high sensitivity troponin I (hsTnI) values and significant  changes across serial measurements may suggest ACS but many other  chronic and acute conditions are known to elevate hsTnI results.  Refer to the "Links" section for chest pain algorithms and additional  guidance. Performed at [facility], [address] [place], [place] [address]   Urinalysis, Complete w Microscopic     Status: Abnormal   Collection Time: 01/24/20  7:20 PM  Result Value Ref Range   Color, Urine COLORLESS (A) YELLOW   APPearance CLEAR (A) CLEAR   Specific Gravity, Urine 1.003 (L) 1.005 - 1.030   pH 8.0 5.0 - 8.0   Glucose, UA NEGATIVE NEGATIVE mg/dL   Hgb urine dipstick NEGATIVE NEGATIVE   Bilirubin Urine NEGATIVE NEGATIVE   Ketones, ur NEGATIVE NEGATIVE mg/dL   Protein, ur NEGATIVE NEGATIVE mg/dL   Nitrite NEGATIVE NEGATIVE   Leukocytes,Ua TRACE (A) NEGATIVE   RBC / HPF 0-5 0 - 5 RBC/hpf   WBC, UA 0-5 0 - 5 WBC/hpf   Bacteria, UA NONE SEEN NONE SEEN   Squamous Epithelial / LPF NONE SEEN 0 - 5    Comment: Performed at [facility], [address] [place], [place] [address]  SARS Coronavirus 2 by RT PCR (hospital order, performed in [facility] hospital lab) Nasopharyngeal Nasopharyngeal Swab     Status: None   Collection Time: 01/24/20  7:49 PM   Specimen: Nasopharyngeal Swab  Result Value Ref Range   SARS Coronavirus 2 NEGATIVE NEGATIVE    Comment: (NOTE) SARS-CoV-2 target nucleic acids are NOT DETECTED.  The SARS-CoV-2 RNA is generally detectable in upper and lower respiratory specimens during the acute phase of infection. The lowest concentration of SARS-CoV-2 viral copies this assay can detect is 250 copies / mL. A negative result does not preclude SARS-CoV-2 infection and should not be used as the sole basis for treatment or other patient management decisions.  A negative result may occur with improper specimen collection /  handling, submission of specimen other than nasopharyngeal swab, presence of viral mutation(s) within the areas targeted by this assay, and inadequate number of viral copies (<250 copies / mL). A negative result must be combined with clinical observations, patient history, and epidemiological information.  Fact Sheet for Patients:   StrictlyIdeas.no  Fact Sheet for Healthcare Providers: BankingDealers.co.za  This test is not yet approved or  cleared by the [place] FDA and has been authorized for detection and/or diagnosis of SARS-CoV-2 by FDA under an Emergency Use Authorization (EUA).  This EUA will remain in effect (meaning this test can be used) for the duration of the COVID-19 declaration under Section 564(b)(1) of the Act, 21 U.S.C. section 360bbb-3(b)(1), unless the authorization is terminated or revoked sooner.  Performed at [facility], [address] [place], [place] [address]   Troponin I (High Sensitivity)     Status: Abnormal   Collection Time: 01/24/20  8:38 PM  Result Value Ref Range   Troponin I (High Sensitivity) 20 (H) <18 ng/L    Comment: (NOTE)  Elevated high sensitivity troponin I (hsTnI) values and significant  changes across serial measurements may suggest ACS but many other  chronic and acute conditions are known to elevate hsTnI results.  Refer to the "Links" section for chest pain algorithms and additional  guidance. Performed at [facility], [address] [place], [place] [address]   MRSA PCR Screening     Status: Abnormal   Collection Time: 01/25/20  1:37 AM   Specimen: Nasopharyngeal  Result Value Ref Range   MRSA by PCR POSITIVE (A) NEGATIVE    Comment:        The GeneXpert MRSA Assay (FDA approved for NASAL specimens only), is one component of a comprehensive MRSA colonization surveillance program. It is not intended to diagnose MRSA infection nor to guide  or monitor treatment for MRSA infections. RESULT CALLED TO, READ BACK BY AND VERIFIED WITH: stacey clay at [facility] on 01/25/20 rww Performed at [facility], [address] [place], [place] [address]   HIV Antibody (routine testing w rflx)     Status: None   Collection Time: 01/25/20  4:25 AM  Result Value Ref Range   HIV Screen 4th Generation wRfx Non Reactive Non Reactive    Comment: Performed at [facility], [place] [address], [place], [place] [address]  Basic metabolic panel     Status: Abnormal   Collection Time: 01/25/20  4:25 AM  Result Value Ref Range   Sodium 126 (L) 135 - 145 mmol/L   Potassium 3.8 3.5 - 5.1 mmol/L   Chloride 87 (L) 98 - 111 mmol/L   CO2 31 22 - 32 mmol/L   Glucose, Bld 140 (H) 70 - 99 mg/dL    Comment: Glucose reference range applies only to samples taken after fasting for at least 8 hours.   BUN 18 6 - 20 mg/dL   Creatinine, Ser 0.77 0.44 - 1.00 mg/dL   Calcium 8.7 (L) 8.9 - 10.3 mg/dL   GFR calc non Af Amer >60 >60 mL/min   GFR calc Af Amer >60 >60 mL/min   Anion gap 8 5 - 15    Comment: Performed at [facility], [address] [place], [place] [address]  CBC WITH DIFFERENTIAL     Status: Abnormal   Collection Time: 01/25/20  4:25 AM  Result Value Ref Range   WBC 6.3 4.0 - 10.5 K/uL   RBC 3.95 3.87 - 5.11 MIL/uL   Hemoglobin 11.6 (L) 12.0 - 15.0 g/dL   HCT 33.2 (L) 36 - 46 %   MCV 84.1 80.0 - 100.0 fL   MCH 29.4 26.0 - 34.0 pg   MCHC 34.9 30.0 - 36.0 g/dL   RDW 13.3 11.5 - 15.5 %   Platelets 168 150 - 400 K/uL   nRBC 0.0 0.0 - 0.2 %   Neutrophils Relative % 74 %   Neutro Abs 4.6 1.7 - 7.7 K/uL   Lymphocytes Relative 14 %   Lymphs Abs 0.9 0.7 - 4.0 K/uL   Monocytes Relative 11 %   Monocytes Absolute 0.7 0 - 1 K/uL   Eosinophils Relative 1 %   Eosinophils Absolute 0.1 0 - 0 K/uL   Basophils Relative 0 %   Basophils Absolute 0.0 0 - 0 K/uL   Immature Granulocytes 0 %   Abs Immature Granulocytes 0.02 0.00 - 0.07 K/uL     Comment: Performed at [facility], [address] [place], [place] [address]    Current Facility-Administered Medications  Medication Dose Route Frequency Provider Last Rate Last Admin  . 0.9 %  sodium chloride infusion  250 mL Intravenous PRN Opyd, Ilene Qua, MD      . acetaminophen (TYLENOL) tablet 650 mg  650 mg Oral Q6H PRN Opyd, Ilene Qua, MD   650 mg at 01/25/20 0053   Or  . acetaminophen (TYLENOL) suppository 650 mg  650 mg Rectal Q6H PRN Opyd, Ilene Qua, MD      . albuterol (PROVENTIL) (2.5 MG/3ML) 0.083% nebulizer solution 2.5 mg  2.5 mg Inhalation Q6H PRN Opyd, Ilene Qua, MD      . bisacodyl (DULCOLAX) EC tablet 10 mg  10 mg Oral Daily Georgette Shell, MD      . buPROPion (WELLBUTRIN XL) 24 hr tablet 150 mg  150 mg Oral Daily Opyd, Ilene Qua, MD   150 mg at 01/25/20 7943  . Chlorhexidine Gluconate Cloth 2 % PADS 6 each  6 each Topical Q0600 Opyd, Ilene Qua, MD   6 each at 01/25/20 0522  . clonazePAM (KLONOPIN) tablet 0.5 mg  0.5 mg Oral BID Opyd, Ilene Qua, MD   0.5 mg at 01/25/20 2761  . dicyclomine (BENTYL) tablet 20 mg  20 mg Oral TID PRN Sharion Settler, NP   20 mg at 01/25/20 0122  . divalproex (DEPAKOTE ER) 24 hr tablet 500 mg  500 mg Oral BID Daray Polgar T, MD      . docusate sodium (COLACE) capsule 100 mg  100 mg Oral BID Opyd, Ilene Qua, MD   100 mg at 01/25/20 0823  . enoxaparin (LOVENOX) injection 40 mg  40 mg Subcutaneous Q24H Opyd, Ilene Qua, MD   40 mg at 01/25/20 0053  . fluticasone furoate-vilanterol (BREO ELLIPTA) 100-25 MCG/INH 1 puff  1 puff Inhalation Daily Opyd, Ilene Qua, MD   1 puff at 01/25/20 1013  . furosemide (LASIX) tablet 20 mg  20 mg Oral BID Vianne Bulls, MD   20 mg at 01/25/20 4709  . guaiFENesin (ROBITUSSIN) 100 MG/5ML solution 300 mg  15 mL Oral Q4H PRN Opyd, Ilene Qua, MD      . hydrOXYzine (ATARAX/VISTARIL) tablet 25 mg  25 mg Oral Q6H PRN Opyd, Ilene Qua, MD   25 mg at 01/25/20 0851  . ipratropium-albuterol (DUONEB)  0.5-2.5 (3) MG/3ML nebulizer solution 3 mL  3 mL Nebulization TID Opyd, Ilene Qua, MD   3 mL at 01/25/20 1307  . lisinopril (ZESTRIL) tablet 10 mg  10 mg Oral Daily Opyd, Ilene Qua, MD   10 mg at 01/25/20 2957  . lurasidone (LATUDA) tablet 20 mg  20 mg Oral BID Opyd, Ilene Qua, MD   20 mg at 01/25/20 0823  . metoprolol succinate (TOPROL-XL) 24 hr tablet 12.5 mg  12.5 mg Oral Daily Opyd, Ilene Qua, MD   12.5 mg at 01/25/20 4734  . mupirocin ointment (BACTROBAN) 2 % 1 application  1 application Nasal BID Opyd, Ilene Qua, MD   1 application at 03/70/96 [phone]  . neomycin-bacitracin-polymyxin (NEOSPORIN) ointment packet 1 application  1 application Topical PRN Opyd, Ilene Qua, MD      . nicotine (NICODERM CQ - dosed in mg/24 hours) patch 21 mg  21 mg Transdermal Daily Opyd, Ilene Qua, MD   21 mg at 01/25/20 0828  . NIFEdipine (ADALAT CC) 24 hr tablet 60 mg  60 mg Oral Daily Opyd, Ilene Qua, MD   60 mg at 01/25/20 0823  . OLANZapine (ZYPREXA) tablet 10 mg  10 mg Oral  BID Vianne Bulls, MD   10 mg at 01/25/20 0820  . ondansetron (ZOFRAN) tablet 4 mg  4 mg Oral Q6H PRN Opyd, Ilene Qua, MD       Or  . ondansetron (ZOFRAN) injection 4 mg  4 mg Intravenous Q6H PRN Opyd, Ilene Qua, MD      . oxyCODONE (Oxy IR/ROXICODONE) immediate release tablet 5 mg  5 mg Oral Q3H PRN Georgette Shell, MD   5 mg at 01/25/20 1500  . pantoprazole (PROTONIX) EC tablet 40 mg  40 mg Oral Daily Opyd, Ilene Qua, MD   40 mg at 01/25/20 0820  . polyethylene glycol (MIRALAX / GLYCOLAX) packet 17 g  17 g Oral BID Georgette Shell, MD      . polyvinyl alcohol (LIQUIFILM TEARS) 1.4 % ophthalmic solution 1 drop  1 drop Both Eyes PRN Opyd, Ilene Qua, MD      . sodium chloride flush (NS) 0.9 % injection 3 mL  3 mL Intravenous Q12H Opyd, Ilene Qua, MD   3 mL at 01/25/20 1013  . sodium chloride flush (NS) 0.9 % injection 3 mL  3 mL Intravenous PRN Opyd, Ilene Qua, MD      . sucralfate (CARAFATE) 1 GM/10ML suspension 1 g  1 g Oral TID  WC & HS Opyd, Ilene Qua, MD   1 g at 01/25/20 1205  . traZODone (DESYREL) tablet 50 mg  50 mg Oral QHS PRN Sharion Settler, NP        Musculoskeletal: Strength & Muscle Tone: within normal limits Gait & Station: unsteady Patient leans: N/A  Psychiatric Specialty Exam: Physical Exam Vitals and nursing note reviewed.  Constitutional:      Appearance: She is well-developed.  HENT:     Head: Normocephalic and atraumatic.  Eyes:     Conjunctiva/sclera: Conjunctivae normal.     Pupils: Pupils are equal, round, and reactive to light.  Cardiovascular:     Heart sounds: Normal heart sounds.  Pulmonary:     Effort: Pulmonary effort is normal.  Abdominal:     Palpations: Abdomen is soft.  Musculoskeletal:        General: Normal range of motion.     Cervical back: Normal range of motion.  Skin:    General: Skin is warm and dry.  Neurological:     General: No focal deficit present.     Mental Status: She is alert.  Psychiatric:        Attention and Perception: She is inattentive.        Mood and Affect: Mood is anxious and depressed.        Speech: Speech is rapid and pressured and tangential.        Behavior: Behavior is agitated. Behavior is not aggressive.        Thought Content: Thought content is paranoid. Thought content does not include homicidal or suicidal ideation.        Cognition and Memory: Cognition is impaired.        Judgment: Judgment is impulsive and inappropriate.     Review of Systems  Constitutional: Negative.   HENT: Negative.   Eyes: Negative.   Respiratory: Negative.   Cardiovascular: Negative.   Gastrointestinal: Positive for abdominal pain.  Musculoskeletal: Negative.   Skin: Negative.   Neurological: Negative.   Psychiatric/Behavioral: Positive for dysphoric mood. The patient is nervous/anxious.     Blood pressure 115/74, pulse 70, temperature 98.2 F (36.8 C), temperature source Oral, resp. rate 20, height  5' 5"  (1.651 m), weight (!) 102 kg,  SpO2 98 %.Body mass index is 37.42 kg/m.  General Appearance: Disheveled  Eye Contact:  Minimal  Speech:  Garbled  Volume:  Increased  Mood:  Anxious  Affect:  Constricted  Thought Process:  Disorganized  Orientation:  Full (Time, Place, and Person)  Thought Content:  Illogical, Rumination and Tangential  Suicidal Thoughts:  No  Homicidal Thoughts:  No  Memory:  Immediate;   Fair Recent;   Poor Remote;   Poor  Judgement:  Impaired  Insight:  Shallow  Psychomotor Activity:  Normal  Concentration:  Concentration: Poor  Recall:  Poor  Fund of Knowledge:  Fair  Language:  Poor  Akathisia:  No  Handed:  Right  AIMS (if indicated):     Assets:  Desire for Improvement  ADL's:  Impaired  Cognition:  Impaired,  Mild  Sleep:        Treatment Plan Summary: Daily contact with patient to assess and evaluate symptoms and progress in treatment, Medication management and Plan Patient is anxious.  Not really able to identify any clear syndrome that is different from her usual condition.  Patient is chronically pan symptomatic and nervous.  Current condition could be due mostly to agitation from her pain.  I looked through her medicines and decided to go ahead and increase her Depakote to 500 twice a day as that previously had been a better dose for her.  As far as her hyponatremia, the patient swears that she was on fluid restriction at [facility] and was not able to get any extra water.  She has had longstanding problems with hyponatremia and has never been completely clear how much of this was due to psychogenic polydipsia.  On the other hand, medicines have been changed multiple times in an attempt to treat this without any lasting benefit either.  Disposition: No evidence of imminent risk to self or others at present.   Patient does not meet criteria for psychiatric inpatient admission. Supportive therapy provided about ongoing stressors. Discussed crisis plan, support from social  network, calling 911, coming to the Emergency Department, and calling Suicide Hotline.  Alethia Berthold, MD 01/25/2020 5:13 PM

## 2020-01-26 LAB — CBC WITH DIFFERENTIAL/PLATELET
Abs Immature Granulocytes: 0.05 10*3/uL (ref 0.00–0.07)
Basophils Absolute: 0 10*3/uL (ref 0.0–0.1)
Basophils Relative: 0 %
Eosinophils Absolute: 0.1 10*3/uL (ref 0.0–0.5)
Eosinophils Relative: 1 %
HCT: 34.6 % — ABNORMAL LOW (ref 36.0–46.0)
Hemoglobin: 11.3 g/dL — ABNORMAL LOW (ref 12.0–15.0)
Immature Granulocytes: 1 %
Lymphocytes Relative: 18 %
Lymphs Abs: 1.6 10*3/uL (ref 0.7–4.0)
MCH: 28.5 pg (ref 26.0–34.0)
MCHC: 32.7 g/dL (ref 30.0–36.0)
MCV: 87.2 fL (ref 80.0–100.0)
Monocytes Absolute: 0.9 10*3/uL (ref 0.1–1.0)
Monocytes Relative: 10 %
Neutro Abs: 6.2 10*3/uL (ref 1.7–7.7)
Neutrophils Relative %: 70 %
Platelets: 198 10*3/uL (ref 150–400)
RBC: 3.97 MIL/uL (ref 3.87–5.11)
RDW: 13.6 % (ref 11.5–15.5)
WBC: 8.9 10*3/uL (ref 4.0–10.5)
nRBC: 0 % (ref 0.0–0.2)

## 2020-01-26 LAB — BASIC METABOLIC PANEL
Anion gap: 10 (ref 5–15)
BUN: 20 mg/dL (ref 6–20)
CO2: 32 mmol/L (ref 22–32)
Calcium: 8.9 mg/dL (ref 8.9–10.3)
Chloride: 82 mmol/L — ABNORMAL LOW (ref 98–111)
Creatinine, Ser: 0.93 mg/dL (ref 0.44–1.00)
GFR calc Af Amer: >60 mL/min (ref >60)
GFR calc non Af Amer: >60 mL/min (ref >60)
Glucose, Bld: 154 mg/dL — ABNORMAL HIGH (ref 70–99)
Potassium: 4.3 mmol/L (ref 3.5–5.1)
Sodium: 124 mmol/L — ABNORMAL LOW (ref 135–145)

## 2020-01-26 MED ORDER — HYDROXYZINE HCL 10 MG PO TABS
10.0000 mg | ORAL_TABLET | Freq: Three times a day (TID) | ORAL | Status: DC | PRN
  Administered 2020-01-26 – 2020-02-02 (×15): 10 mg via ORAL
  Filled 2020-01-26 (×18): qty 1

## 2020-01-26 MED ORDER — SORBITOL 70 % SOLN
960.0000 mL | TOPICAL_OIL | Freq: Once | ORAL | Status: AC
  Administered 2020-01-26: 960 mL via RECTAL
  Filled 2020-01-26: qty 473

## 2020-01-26 NOTE — TOC Progression Note (Addendum)
Transition of Care [facility]) - Progression Note    Patient Details  Name: Natalie Thomas MRN: [id number] Date of Birth: 02-Sep-1960  Transition [facility] CM/SW Contact  Beverly Sessions, RN Phone Number: 01/26/2020, 10:08 AM  Clinical Narrative:     Level 2 PASRR expires today Fl2 sent for signature.  Clinical submitted for PASRR for new level 2.  Per Claiborne Billings at [facility] they can accept her back with level 2 pending   Update:  Received call from [place] representative "B"  She is to call me tomorrow at 10am to to complete a phone interview    Expected Discharge Plan: Long Term Nursing Home Barriers to Discharge: No Barriers Identified  Expected Discharge Plan and Services Expected Discharge Plan: [place]   Discharge Planning Services: CM Consult   Living arrangements for the past 2 months: [place]                                       Social Determinants of Health (SDOH) Interventions    Readmission Risk Interventions Readmission Risk Prevention Plan 01/25/2020 10/19/2019 09/15/2019  Transportation Screening Complete Complete Complete  Medication Review Press photographer) Complete Complete Complete  PCP or Specialist appointment within 3-5 days of discharge - Complete Complete  HRI or [place] - Complete Complete  HRI or Home Care Consult Pt Refusal Comments - - -  SW Recovery Care/Counseling Consult - - -  Palliative Care Screening Not Applicable - -  Skilled Nursing Facility Complete Complete Complete  Some recent data might be hidden

## 2020-01-26 NOTE — NC FL2 (Signed)
[place] LEVEL OF CARE SCREENING TOOL     IDENTIFICATION  Patient Name: Natalie Thomas Birthdate: 1961/04/17 Sex: female Admission Date (Current Location): 01/24/2020  [facility] and [place] Number:  Engineering geologist and Address:         Provider Number: [id number]  Attending Physician Name and Address:  Georgette Shell, MD  Relative Name and Phone Number:       Current Level of Care: Hospital Recommended Level of Care: [place] Prior Approval Number:    Date Approved/Denied:   PASRR Number: pending  Discharge Plan: SNF    Current Diagnoses: Patient Active Problem List   Diagnosis Date Noted  . Acute respiratory failure ([place]) 10/18/2019  . DNR (do not resuscitate) discussion   . Goals of care, counseling/discussion   . Palliative care by specialist   . Acute on chronic respiratory failure ([place]) 10/01/2019  . Chronic diastolic CHF (congestive heart failure) ([place])   . Shortness of breath 09/22/2019  . Acute on chronic respiratory failure with hypercapnia ([place]) 09/13/2019  . Leukocytosis 09/13/2019  . Class 2 obesity with body mass index (BMI) of 39.0 to 39.9 in adult   . Acute on chronic respiratory failure with hypoxemia ([place]) 09/04/2019  . COVID-19 virus infection   . 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 Nausea   . Acute gastritis   . Abdominal pain, chronic, epigastric   . Hyponatremia 07/09/2019  . Ventral hernia 07/09/2019  . Hypertensive crisis 05/01/2019  . Acute abdominal pain 03/15/2019  . Pleural effusion 03/13/2019  . Atelectasis 03/13/2019  . Acute on chronic combined systolic and diastolic congestive heart failure ([place]) 03/13/2019  . Type 2 diabetes mellitus without complication ([place]) 82/99/3716  . Urinary tract bacterial infections 03/09/2019  . Acute cystitis without hematuria   . Constipation   . Acute hypoxemic respiratory failure ([place]) 12/03/2018  . Irritability 11/09/2018  . Aggressive behavior  11/09/2018  . Agitation   . S/P right hip fracture 11/07/2017  . Closed comminuted intertrochanteric fracture of right femur ([place]) 11/07/2017  . Anxiety and depression   . Dyspnea 10/16/2017  . Anemia 10/16/2017  . COPD exacerbation ([place]) 10/07/2017  . Hyperlipidemia 10/07/2017  . Chronic respiratory failure with hypoxia ([place]) 10/07/2017  . Acute on chronic respiratory failure with hypoxia and hypercapnia ([place]) 10/07/2017  . Incontinence of urine 08/14/2011  . Schizoaffective disorder, bipolar type ([place]) 07/05/2011  . Cough 05/08/2011  . Lumbar disc disease 04/09/2011  . Polycythemia secondary to smoking 04/09/2011  . HIP PAIN, LEFT 12/12/2007  . 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 DM (diabetes mellitus), type 2 ([place]) 04/08/2007  . MENOPAUSE, PREMATURE 04/08/2007  . Chronic hyponatremia 04/08/2007  . Schizoaffective disorder ([place]) 04/08/2007  . Essential hypertension 04/08/2007  . COPD (chronic obstructive pulmonary disease) ([place]) 04/08/2007  . GERD 04/08/2007  . Osteoporosis 04/08/2007    Orientation RESPIRATION BLADDER Height & Weight     Self, Time  O2 (3L) Incontinent Weight: 99.9 kg Height:  5\' 5"  (165.1 cm)  BEHAVIORAL SYMPTOMS/MOOD NEUROLOGICAL BOWEL NUTRITION STATUS      Continent Diet (Heart healthy carb modifed)  AMBULATORY STATUS COMMUNICATION OF NEEDS Skin   Supervision Verbally Bruising                       Personal Care Assistance Level of Assistance              Functional Limitations Info  SPECIAL CARE FACTORS FREQUENCY                       Contractures Contractures Info: Not present    Additional Factors Info  Code Status, Allergies Code Status Info: DNR Allergies Info: Clarithromycin, penicillin           Current Medications (01/26/2020):  This is the current hospital active medication list Current Facility-Administered Medications  Medication Dose Route Frequency Provider Last Rate Last Admin  . 0.9 %   sodium chloride infusion  250 mL Intravenous PRN Opyd, Ilene Qua, MD      . acetaminophen (TYLENOL) tablet 650 mg  650 mg Oral Q6H PRN Opyd, Ilene Qua, MD   650 mg at 01/25/20 0053   Or  . acetaminophen (TYLENOL) suppository 650 mg  650 mg Rectal Q6H PRN Opyd, Ilene Qua, MD      . albuterol (PROVENTIL) (2.5 MG/3ML) 0.083% nebulizer solution 2.5 mg  2.5 mg Inhalation Q6H PRN Opyd, Ilene Qua, MD   2.5 mg at 01/26/20 0241  . bisacodyl (DULCOLAX) EC tablet 10 mg  10 mg Oral Daily Georgette Shell, MD      . buPROPion (WELLBUTRIN XL) 24 hr tablet 150 mg  150 mg Oral Daily Opyd, Ilene Qua, MD   150 mg at 01/25/20 8416  . Chlorhexidine Gluconate Cloth 2 % PADS 6 each  6 each Topical Q0600 Opyd, Ilene Qua, MD   6 each at 01/25/20 0522  . clonazePAM (KLONOPIN) tablet 0.5 mg  0.5 mg Oral BID Opyd, Ilene Qua, MD   0.5 mg at 01/25/20 2135  . dicyclomine (BENTYL) tablet 20 mg  20 mg Oral TID PRN Sharion Settler, NP   20 mg at 01/25/20 0122  . diphenhydrAMINE (BENADRYL) capsule 25 mg  25 mg Oral Q6H PRN Sharion Settler, NP   25 mg at 01/25/20 2343  . divalproex (DEPAKOTE ER) 24 hr tablet 500 mg  500 mg Oral BID Clapacs, Madie [place]o, MD   500 mg at 01/25/20 2135  . docusate sodium (COLACE) capsule 100 mg  100 mg Oral BID Vianne Bulls, MD   100 mg at 01/25/20 2136  . enoxaparin (LOVENOX) injection 40 mg  40 mg Subcutaneous Q24H Opyd, Ilene Qua, MD   40 mg at 01/25/20 2343  . fluticasone furoate-vilanterol (BREO ELLIPTA) 100-25 MCG/INH 1 puff  1 puff Inhalation Daily Opyd, Ilene Qua, MD   1 puff at 01/25/20 1013  . furosemide (LASIX) tablet 20 mg  20 mg Oral BID Opyd, Ilene Qua, MD   20 mg at 01/25/20 1803  . guaiFENesin (ROBITUSSIN) 100 MG/5ML solution 300 mg  15 mL Oral Q4H PRN Opyd, Ilene Qua, MD   300 mg at 01/25/20 2219  . ipratropium-albuterol (DUONEB) 0.5-2.5 (3) MG/3ML nebulizer solution 3 mL  3 mL Nebulization TID Opyd, Ilene Qua, MD   3 mL at 01/26/20 0734  . lisinopril (ZESTRIL) tablet 10 mg  10 mg  Oral Daily Opyd, Ilene Qua, MD   10 mg at 01/25/20 6063  . lurasidone (LATUDA) tablet 20 mg  20 mg Oral BID Opyd, Ilene Qua, MD   20 mg at 01/25/20 2135  . metoprolol succinate (TOPROL-XL) 24 hr tablet 12.5 mg  12.5 mg Oral Daily Opyd, Ilene Qua, MD   12.5 mg at 01/25/20 0160  . mupirocin ointment (BACTROBAN) 2 % 1 application  1 application Nasal BID Opyd, Ilene Qua, MD   1 application at 10/93/23 2135  . neomycin-bacitracin-polymyxin (  NEOSPORIN) ointment packet 1 application  1 application Topical PRN Opyd, Ilene Qua, MD   1 application at 09/04/79 0231  . nicotine (NICODERM CQ - dosed in mg/24 hours) patch 21 mg  21 mg Transdermal Daily Opyd, Ilene Qua, MD   21 mg at 01/25/20 0828  . NIFEdipine (ADALAT CC) 24 hr tablet 60 mg  60 mg Oral Daily Opyd, Ilene Qua, MD   60 mg at 01/25/20 0823  . OLANZapine (ZYPREXA) tablet 10 mg  10 mg Oral BID Opyd, Ilene Qua, MD   10 mg at 01/25/20 2135  . ondansetron (ZOFRAN) tablet 4 mg  4 mg Oral Q6H PRN Opyd, Ilene Qua, MD       Or  . ondansetron (ZOFRAN) injection 4 mg  4 mg Intravenous Q6H PRN Opyd, Ilene Qua, MD      . oxyCODONE (Oxy IR/ROXICODONE) immediate release tablet 5 mg  5 mg Oral Q3H PRN Georgette Shell, MD   5 mg at 01/26/20 0036  . pantoprazole (PROTONIX) EC tablet 40 mg  40 mg Oral Daily Opyd, Ilene Qua, MD   40 mg at 01/25/20 0820  . polyethylene glycol (MIRALAX / GLYCOLAX) packet 17 g  17 g Oral BID Georgette Shell, MD   17 g at 01/25/20 2134  . polyvinyl alcohol (LIQUIFILM TEARS) 1.4 % ophthalmic solution 1 drop  1 drop Both Eyes PRN Opyd, Ilene Qua, MD      . sodium chloride flush (NS) 0.9 % injection 3 mL  3 mL Intravenous Q12H Opyd, Ilene Qua, MD   3 mL at 01/25/20 2140  . sucralfate (CARAFATE) 1 GM/10ML suspension 1 g  1 g Oral TID WC & HS Opyd, Ilene Qua, MD   1 g at 01/25/20 2134  . traZODone (DESYREL) tablet 50 mg  50 mg Oral QHS PRN Sharion Settler, NP   50 mg at 01/25/20 2219     Discharge Medications: Please see  discharge summary for a list of discharge medications.  Relevant Imaging Results:  Relevant Lab Results:   Additional Information [id number]  Beverly Sessions, RN

## 2020-01-26 NOTE — Progress Notes (Signed)
RT to patient bedside for PRN breathing treatment. Patient has bilateral expiratory wheezes noted. Breathing treatment given at this time. Will continue to monitor.

## 2020-01-26 NOTE — Progress Notes (Signed)
PROGRESS NOTE    Natalie Thomas  HDQ:[id number] DOB: 21-Jan-1961 DOA: 01/24/2020 PCP: System, Pcp Not In   Brief Narrative: :[name] a 59 y.o.femalewith medical history significant forvulvar cancer, COPD, chronic hypoxic respiratory failure, schizoaffective disorder, chronic hyponatremia, hypertension, and chronic diastolic CHF, now presenting to the emergency department for evaluation of several complaints including nausea, malaise, and abdominal discomfort. Patient has a difficult time providing history but indicates longstanding cramping discomfort in her abdomen that has recently worsened, nausea, and episode of nonbloody vomiting yesterday morning, and general malaise. She denies any fevers, worsening shortness of breath or cough, or chest pain.  ED Course:Upon arrival to the ED, patient is found to be afebrile, saturating well on her usual 4 L/min of supplemental oxygen, and with stable blood pressure. EKG features sinus rhythm with LVH. Chest x-ray notable for chronic cardiomegaly and vascular congestion with improved pulmonary edema from prior study. CT of the abdomen and pelvis demonstrates some increase in fat and bowel-containing umbilical hernia but without any obstruction or vascular compromise. Chemistry panel notable for sodium of 125, down from 130 in May 2021. CBC unremarkable. Troponin slightly elevated. Urinalysis with low specific gravity. Covid PCR is negative. Patient was given 150 cc of saline, fentanyl, and Ativan in the ED and hospitalist asked to admit for hyponatremia.  Assessment & Plan:   Principal Problem:   Schizoaffective disorder, bipolar type ([place]) Active Problems:   DM (diabetes mellitus), type 2 ([place])   Chronic hyponatremia   COPD (chronic obstructive pulmonary disease) (HCC)   Chronic respiratory failure with hypoxia (HCC)   Type 2 diabetes mellitus without complication (HCC)   Hyponatremia   Psychogenic polydipsia   Chronic  diastolic CHF (congestive heart failure) ([place])  #1 hypervolemic hyponatremia patient with history of psychogenic polydipsia, continue fluid restriction and Lasix.  Unfortunately sodium dropped to 124 from 126.  She is on a fluid restriction 1500 cc.  I will change that to 200 cc recheck labs tomorrow.  Continue to hold hydrochlorothiazide.  Continue Lasix.   #2 abdominal pain patient reports that she has been complaining of abdominal pain for a long time and is having an abdominal hernia and CT scan suggest a hiatal hernia.  She is complaining of 10 out of 10 abdominal pain requesting morphine.  I will consult surgery and see if they have anything to offer.   Appreciate surgery input.  She is not a surgical candidate.  CT scan of the abdomen and pelvis-Fat and bowel containing umbilical hernia increased in size from comparison without evidence of resulting mechanical obstruction or vascular compromise at this time.  Mild circumferential bladder wall thickening and bladder distension. Correlate for urinary retention symptoms and consider urinalysis to exclude cystitis.  Small sliding-type hiatal hernia with fluid in the distal thoracic esophagus, correlate for reflux.  Trace left pleural effusion with associated atelectasis in the left lung base.  Cardiomegaly and coronary atherosclerosis with stable small pericardial effusion.  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 Likely augmentation of remote bilateral sacral insufficiency fractures.   UA on admission urine is colorless trace amount of leukocytes specific gravity is 1.003 low.  Lipase 25 LFTs otherwise normal.  White count on admission 8.1 hemoglobin 12.1.  #3 chronic diastolic heart failure continue Lasix ACE inhibitor and beta-blocker.  She is negative by 3400 cc.  #4 history of COPD and  chronic hypoxic respiratory  failure on 4 L of oxygen prior to admission.  Titrate oxygen to keep her sats above 88%.  #5 she is affective disorder continue Zyprexa Depakote Wellbutrin Klonopin and Latuda.  #6 hypertension on lisinopril metoprolol and Lasix.  Her blood pressure is 133/72   Estimated body mass index is 36.65 kg/m as calculated from the following:   Height as of this encounter: 5\' 5"  (1.651 m).   Weight as of this encounter: 99.9 kg.   DVT prophylaxis: lovenox Code Status:dnr Family Communication: none Disposition Plan:  below  Dispo: The patient is from: Group home  Anticipated d/c is to: Group home  Anticipated d/c date is: > 2 days  Patient currently is not medically stable to d/c.  She is severely hyponatremic need to correct sodium prior to discharge.  Procedures: none Antimicrobials:none Consultants: psych surgery   Subjective: Patient is sitting up and eating an egg mushroom omelette without any distress.  However she tells me her belly is hurting.  She has not had a BM in few days.  She is chronically on oxygen at this time she is on 4 L saturating 94%.  She has a lot of questions and unfortunately she does not like anything that I have offered.  She continued to request for morphine for severe back pain abdominal pain, I explained to her that I cannot give her any IV narcotics.  She is also adamant about not going back to where she came from.  She does not have any family and she is a ward of the state.  Objective: Vitals:   01/26/20 0500 01/26/20 0542 01/26/20 1027 01/26/20 1301  BP:  123/72 114/82 123/78  Pulse:  69 80 71  Resp:  20  14  Temp:  98.4 F (36.9 C)  98.3 F (36.8 C)  TempSrc:  Oral  Oral  SpO2:  97%  96%  Weight: 99.9 kg     Height:        Intake/Output Summary (Last 24 hours) at 01/26/2020 1400 Last data filed at 01/26/2020 1043 Gross per 24 hour  Intake 360 ml  Output 2750 ml  Net -2390 ml    Filed Weights   01/24/20 1808 01/25/20 0625 01/26/20 0500  Weight: (!) 102 kg (!) 102 kg 99.9 kg    Examination:  General exam: Appears calm and comfortable  Respiratory system: Clear to auscultation. Respiratory effort normal. Cardiovascular system: S1 & S2 heard, RRR. No JVD, murmurs, rubs, gallops or clicks. No pedal edema. Gastrointestinal system: Abdomen is nondistended, soft and nontender. No organomegaly or masses felt. Normal bowel sounds heard. Central nervous system: Alert and oriented. No focal neurological deficits. Extremities: Symmetric 5 x 5 power. Skin: No rashes, lesions or ulcers Psychiatry: Judgement and insight appear normal. Mood & affect appropriate.   Data Reviewed: I have personally reviewed following labs and imaging studies  CBC: Recent Labs  Lab 01/24/20 1849 01/25/20 0425 01/26/20 0411  WBC 8.1 6.3 8.9  NEUTROABS 5.9 4.6 6.2  HGB 12.1 11.6* 11.3*  HCT 35.5* 33.2* 34.6*  MCV 83.9 84.1 87.2  PLT 195 168 814   Basic Metabolic Panel: Recent Labs  Lab 01/24/20 1849 01/25/20 0425 01/26/20 0411  NA 125* 126* 124*  K 4.1 3.8 4.3  CL 81* 87* 82*  CO2 31 31 32  GLUCOSE 116* 140* 154*  BUN 21* 18 20  CREATININE 0.79 0.77 0.93  CALCIUM 9.1 8.7* 8.9   GFR: Estimated Creatinine Clearance: 77.2 mL/min (by C-G formula based on  SCr of 0.93 mg/dL). Liver Function Tests: Recent Labs  Lab 01/24/20 1849  AST 17  ALT 10  ALKPHOS 60  BILITOT 0.6  PROT 7.9  ALBUMIN 3.8   Recent Labs  Lab 01/24/20 1849  LIPASE 25   No results for input(s): AMMONIA in the last 168 hours. Coagulation Profile: No results for input(s): INR, PROTIME in the last 168 hours. Cardiac Enzymes: No results for input(s): CKTOTAL, CKMB, CKMBINDEX, TROPONINI in the last 168 hours. BNP (last 3 results) No results for input(s): PROBNP in the last 8760 hours. HbA1C: No results for input(s): HGBA1C in the last 72 hours. CBG: No results for input(s): GLUCAP in the last 168  hours. Lipid Profile: No results for input(s): CHOL, HDL, LDLCALC, TRIG, CHOLHDL, LDLDIRECT in the last 72 hours. Thyroid Function Tests: No results for input(s): TSH, T4TOTAL, FREET4, T3FREE, THYROIDAB in the last 72 hours. Anemia Panel: No results for input(s): VITAMINB12, FOLATE, FERRITIN, TIBC, IRON, RETICCTPCT in the last 72 hours. Sepsis Labs: No results for input(s): PROCALCITON, LATICACIDVEN in the last 168 hours.  Recent Results (from the past 240 hour(s))  SARS Coronavirus 2 by RT PCR (hospital order, performed in [facility] hospital lab) Nasopharyngeal Nasopharyngeal Swab     Status: None   Collection Time: 01/24/20  7:49 PM   Specimen: Nasopharyngeal Swab  Result Value Ref Range Status   SARS Coronavirus 2 NEGATIVE NEGATIVE Final    Comment: (NOTE) SARS-CoV-2 target nucleic acids are NOT DETECTED.  The SARS-CoV-2 RNA is generally detectable in upper and lower respiratory specimens during the acute phase of infection. The lowest concentration of SARS-CoV-2 viral copies this assay can detect is 250 copies / mL. A negative result does not preclude SARS-CoV-2 infection and should not be used as the sole basis for treatment or other patient management decisions.  A negative result may occur with improper specimen collection / handling, submission of specimen other than nasopharyngeal swab, presence of viral mutation(s) within the areas targeted by this assay, and inadequate number of viral copies (<250 copies / mL). A negative result must be combined with clinical observations, patient history, and epidemiological information.  Fact Sheet for Patients:   StrictlyIdeas.no  Fact Sheet for Healthcare Providers: BankingDealers.co.za  This test is not yet approved or  cleared by the [place] FDA and has been authorized for detection and/or diagnosis of SARS-CoV-2 by FDA under an Emergency Use Authorization (EUA).  This EUA  will remain in effect (meaning this test can be used) for the duration of the COVID-19 declaration under Section 564(b)(1) of the Act, 21 U.S.C. section 360bbb-3(b)(1), unless the authorization is terminated or revoked sooner.  Performed at [facility], [address] [place], [place] [address]   MRSA PCR Screening     Status: Abnormal   Collection Time: 01/25/20  1:37 AM   Specimen: Nasopharyngeal  Result Value Ref Range Status   MRSA by PCR POSITIVE (A) NEGATIVE Final    Comment:        The GeneXpert MRSA Assay (FDA approved for NASAL specimens only), is one component of a comprehensive MRSA colonization surveillance program. It is not intended to diagnose MRSA infection nor to guide or monitor treatment for MRSA infections. RESULT CALLED TO, READ BACK BY AND VERIFIED WITH: stacey clay at 0348 on 01/25/20 rww Performed at [facility], [address], [place], [place] [address]          Radiology Studies: CT ABDOMEN PELVIS W CONTRAST  Result Date:  01/24/2020 CLINICAL DATA:  Abdominal pain EXAM: CT ABDOMEN AND PELVIS WITH CONTRAST TECHNIQUE: Multidetector CT imaging of the abdomen and pelvis was performed using the standard protocol following bolus administration of intravenous contrast. CONTRAST:  116mL OMNIPAQUE IOHEXOL 300 MG/ML  SOLN COMPARISON:  CT 07/01/2019 FINDINGS: Lower chest: Trace left pleural effusion with associated atelectasis in the left lung base. Right basilar atelectasis noted as well. Mild cardiomegaly with coronary artery calcifications and a small pericardial effusion. Hepatobiliary: No focal liver abnormality is seen. Patient is post cholecystectomy. Slight prominence of the biliary tree likely related to reservoir effect. No calcified intraductal gallstones. Pancreas: Unremarkable. No pancreatic ductal dilatation or surrounding inflammatory changes. Spleen: Normal in size. No concerning splenic lesions. Adrenals/Urinary Tract: Normal  adrenal glands. Kidneys enhance and excrete symmetrically. Few subcentimeter hypoattenuating foci too small to fully characterize on CT imaging but statistically likely benign. Stable mild bilateral perinephric stranding, nonspecific. No concerning renal mass. Bilateral nonobstructing nephrolithiasis as well as vascular calcifications. Scarring seen in the interpolar right kidney. No obstructive urolithiasis or hydronephrosis. Mild bladder distention with circumferential bladder wall thickening and indentation of the bladder base by an enlarged prostate. Stomach/Bowel: Small sliding-type hiatal hernia with fluid in the distal thoracic esophagus, correlate for reflux. Stomach and duodenum are unremarkable. No proximal small bowel thickening or dilatation. Loops of small bowel protrude into a fat and bowel containing umbilical hernia. No resulting mechanical obstruction or evidence of vascular compromise at this time. No colonic dilatation or wall thickening. Scattered colonic diverticula without focal inflammation to suggest diverticulitis. Vascular/Lymphatic: Extensive atherosclerotic calcifications within the abdominal aorta and branch vessels including exuberant calcification in the mid abdominal aorta below the renal artery origins which may result in some high-grade stenosis (2/30). Additional plaque narrowing of the celiac, SMA, renal artery and IMA origins is likely present though incompletely assessed on this non angiographic technique. No aneurysm or ectasia. No enlarged abdominopelvic lymph nodes. Reproductive: Anteverted uterus.  No concerning adnexal lesions. Other: Fat and bowel containing umbilical hernia increased in size from comparison without evidence of resulting mechanical obstruction or vascular compromise at this time. Mild edematous changes of the bilateral flanks. Soft tissue nodules in the subcutaneous fat of the anterolateral abdominal wall may reflect injectable use. Musculoskeletal:  Multilevel degenerative changes are present in the imaged portions of the spine. Unchanged compression deformities at T11 and L1 as well as the superior endplate L3. Exaggerated lumbar lordosis similar to comparison. Hyperdense changes at the SI joints may reflect augmentation of prior insufficiency fractures. 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 Additional remote posttraumatic deformities of the right superior pubic ramus. IMPRESSION: 1. Fat and bowel containing umbilical hernia increased in size from comparison without evidence of resulting mechanical obstruction or vascular compromise at this time. 2. Mild circumferential bladder wall thickening and bladder distension. Correlate for urinary retention symptoms and consider urinalysis to exclude cystitis. 3. Small sliding-type hiatal hernia with fluid in the distal thoracic esophagus, correlate for reflux. 4. Trace left pleural effusion with associated atelectasis in the left lung base. 5. Cardiomegaly and coronary atherosclerosis with stable small pericardial effusion. 6. 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 7. Remote compression deformities T11, L1. Likely augmentation of remote bilateral sacral insufficiency fractures. Correlate with procedure history. 8. Aortic Atherosclerosis (ICD10-I70.0). Electronically Signed   By: Elwin Sleight.D.  On: 01/24/2020 21:44   DG Chest Portable 1 View  Result Date: 01/24/2020 CLINICAL DATA:  Chest pain.  Abdominal pain today. EXAM: PORTABLE CHEST 1 VIEW COMPARISON:  Chest radiograph 10/18/2019 FINDINGS: Chronic cardiomegaly with slight improvement from prior exam. Aortic atherosclerosis. Vascular congestion. Improved pulmonary edema from prior exam. There is mild central peribronchial thickening. No  large pleural effusion. No evidence of pneumothorax or focal airspace disease. IMPRESSION: Chronic cardiomegaly with vascular congestion. Improved pulmonary edema from prior exam. Mild central peribronchial thickening may be bronchitic or congestive. Electronically Signed   By: Keith Rake M.D.   On: 01/24/2020 18:59        Scheduled Meds: . bisacodyl  10 mg Oral Daily  . buPROPion  150 mg Oral Daily  . Chlorhexidine Gluconate Cloth  6 each Topical Q0600  . clonazePAM  0.5 mg Oral BID  . divalproex  500 mg Oral BID  . docusate sodium  100 mg Oral BID  . enoxaparin (LOVENOX) injection  40 mg Subcutaneous Q24H  . fluticasone furoate-vilanterol  1 puff Inhalation Daily  . furosemide  20 mg Oral BID  . ipratropium-albuterol  3 mL Nebulization TID  . lisinopril  10 mg Oral Daily  . lurasidone  20 mg Oral BID  . metoprolol succinate  12.5 mg Oral Daily  . mupirocin ointment  1 application Nasal BID  . nicotine  21 mg Transdermal Daily  . NIFEdipine  60 mg Oral Daily  . OLANZapine  10 mg Oral BID  . pantoprazole  40 mg Oral Daily  . polyethylene glycol  17 g Oral BID  . sodium chloride flush  3 mL Intravenous Q12H  . sorbitol, milk of mag, mineral oil, glycerin (SMOG) enema  960 mL Rectal Once  . sucralfate  1 g Oral TID WC & HS   Continuous Infusions: . sodium chloride       LOS: 1 day     Georgette Shell, MD  01/26/2020, 2:00 PM

## 2020-01-27 DIAGNOSIS — E119 Type 2 diabetes mellitus without complications: Secondary | ICD-10-CM

## 2020-01-27 LAB — CBC WITH DIFFERENTIAL/PLATELET
Abs Immature Granulocytes: 0.04 10*3/uL (ref 0.00–0.07)
Basophils Absolute: 0 10*3/uL (ref 0.0–0.1)
Basophils Relative: 0 %
Eosinophils Absolute: 0.1 10*3/uL (ref 0.0–0.5)
Eosinophils Relative: 1 %
HCT: 34 % — ABNORMAL LOW (ref 36.0–46.0)
Hemoglobin: 11.2 g/dL — ABNORMAL LOW (ref 12.0–15.0)
Immature Granulocytes: 0 %
Lymphocytes Relative: 14 %
Lymphs Abs: 1.3 10*3/uL (ref 0.7–4.0)
MCH: 28.2 pg (ref 26.0–34.0)
MCHC: 32.9 g/dL (ref 30.0–36.0)
MCV: 85.6 fL (ref 80.0–100.0)
Monocytes Absolute: 0.9 10*3/uL (ref 0.1–1.0)
Monocytes Relative: 10 %
Neutro Abs: 6.6 10*3/uL (ref 1.7–7.7)
Neutrophils Relative %: 75 %
Platelets: 185 10*3/uL (ref 150–400)
RBC: 3.97 MIL/uL (ref 3.87–5.11)
RDW: 13.5 % (ref 11.5–15.5)
WBC: 8.9 10*3/uL (ref 4.0–10.5)
nRBC: 0 % (ref 0.0–0.2)

## 2020-01-27 LAB — BASIC METABOLIC PANEL
Anion gap: 12 (ref 5–15)
BUN: 35 mg/dL — ABNORMAL HIGH (ref 6–20)
CO2: 28 mmol/L (ref 22–32)
Calcium: 9 mg/dL (ref 8.9–10.3)
Chloride: 83 mmol/L — ABNORMAL LOW (ref 98–111)
Creatinine, Ser: 1.21 mg/dL — ABNORMAL HIGH (ref 0.44–1.00)
GFR calc Af Amer: 57 mL/min — ABNORMAL LOW (ref >60)
GFR calc non Af Amer: 49 mL/min — ABNORMAL LOW (ref >60)
Glucose, Bld: 110 mg/dL — ABNORMAL HIGH (ref 70–99)
Potassium: 4.8 mmol/L (ref 3.5–5.1)
Sodium: 123 mmol/L — ABNORMAL LOW (ref 135–145)

## 2020-01-27 MED ORDER — ENOXAPARIN SODIUM 40 MG/0.4ML ~~LOC~~ SOLN
40.0000 mg | SUBCUTANEOUS | Status: DC
  Administered 2020-01-27 – 2020-02-01 (×6): 40 mg via SUBCUTANEOUS
  Filled 2020-01-27 (×6): qty 0.4

## 2020-01-27 NOTE — Progress Notes (Signed)
[facility]) Hospital Liaison RN note:  This patient is currently followed by Outpatient Palliative with [facility]. We will follow for disposition and notify the team.  Please call with any questions or concerns.  Thank you.  Zandra Abts, RN [facility] Liaison [phone]

## 2020-01-27 NOTE — TOC Progression Note (Signed)
Transition of Care [facility]) - Progression Note    Patient Details  Name: KAELIN HOLFORD MRN: [id number] Date of Birth: March 19, 1961  Transition of Care [facility]) CM/SW Contact  Beverly Sessions, RN Phone Number: 01/27/2020, 10:27 AM  Clinical Narrative:    Telephone assessment completed with Pamala Hurry from [facility]   Expected Discharge Plan: Long Term Nursing Home Barriers to Discharge: No Barriers Identified  Expected Discharge Plan and Services Expected Discharge Plan: [place]   Discharge Planning Services: CM Consult   Living arrangements for the past 2 months: [place]                                       Social Determinants of Health (SDOH) Interventions    Readmission Risk Interventions Readmission Risk Prevention Plan 01/25/2020 10/19/2019 09/15/2019  Transportation Screening Complete Complete Complete  Medication Review Press photographer) Complete Complete Complete  PCP or Specialist appointment within 3-5 days of discharge - Complete Complete  HRI or [place] - Complete Complete  HRI or Home Care Consult Pt Refusal Comments - - -  SW Recovery Care/Counseling Consult - - -  Palliative Care Screening Not Applicable - -  Skilled Nursing Facility Complete Complete Complete  Some recent data might be hidden

## 2020-01-27 NOTE — Progress Notes (Signed)
PROGRESS NOTE  Natalie Thomas IWO:[id number] DOB: [birth year] DOA: 01/24/2020 PCP: System, Pcp Not In  HPI/Recap of past 24 hours: HPI from Dr Ginnie Smart Spiresis a 58 y.o.femalewith medical history significant forvulvar cancer, COPD, chronic hypoxic respiratory failure, schizoaffective disorder, chronic hyponatremia, hypertension, and chronic diastolic CHF, now presenting to the emergency department for evaluation of several complaints including nausea, malaise, and abdominal discomfort. Patient has a difficult time providing history but indicates longstanding cramping discomfort in her abdomen that has recently worsened, nausea, and episode of nonbloody vomiting yesterday morning, and general malaise. She denies any fevers, worsening shortness of breath or cough, or chest pain. In the ED, patient is found to be afebrile, saturating well on her usual 4 L/min of supplemental oxygen, and with stable blood pressure. EKG features sinus rhythm with LVH. Chest x-ray notable for chronic cardiomegaly and vascular congestion with improved pulmonary edema from prior study. CT of the abdomen and pelvis demonstrates some increase in fat and bowel-containing umbilical hernia but without any obstruction or vascular compromise. Chemistry panel notable for sodium of 125, down from 130 in May 2021. CBC unremarkable. Troponin slightly elevated. Urinalysis with low specific gravity. Covid PCR is negative. Patient was given 150 cc of saline, fentanyl, and Ativan in the ED and hospitalist asked to admit for hyponatremia.    Today, patient continues to have multiple complaints including abdominal pain, constipation, paranoid behavior.  No new complaints noted.     Assessment/Plan: Principal Problem:   Schizoaffective disorder, bipolar type ([place]) Active Problems:   DM (diabetes mellitus), type 2 ([place])   Chronic hyponatremia   COPD (chronic obstructive pulmonary disease) (HCC)   Chronic  respiratory failure with hypoxia (HCC)   Type 2 diabetes mellitus without complication (HCC)   Hyponatremia   Psychogenic polydipsia   Chronic diastolic CHF (congestive heart failure) (HCC)   Chronic hypervolemic hyponatremia History of psychogenic polydipsia Sodium continues to dip downwards, may involve nephrology if no improvement or continues to worsen Continue fluid restriction, held Lasix due to rising creatinine Continue to hold HCTZ  Chronic abdominal pain Lipase 25, LFTs WNL Afebrile, with no leukocytosis UA with trace amount of leukocytes, with low specific gravity CT abdomen/pelvis showed fat and bowel-containing umbilical hernia increased in size, without evidence of mechanical obstruction or vascular compromise General surgery consulted, no further recs, not a surgical candidate Pain management, monitor closely  Chronic diastolic HF Continue beta-blocker, nifedipine Held Lasix, lisinopril for now due to AKI  Hypertension Stable Continue meds as above  Chronic hypoxic respiratory failure History of COPD Continue supplemental oxygen  Schizoaffective disorder Continue Zyprexa, Depakote, Wellbutrin, Klonopin, Latuda  Obesity Lifestyle modification advised        Malnutrition Type:      Malnutrition Characteristics:      Nutrition Interventions:       Estimated body mass index is 36.39 kg/m as calculated from the following:   Height as of this encounter: 5\' 5"  (1.651 m).   Weight as of this encounter: 99.2 kg.     Code Status: DNR  Family Communication: None at bedside  Disposition Plan: Status is: Inpatient  Remains inpatient appropriate because:Inpatient level of care appropriate due to severity of illness   Dispo: The patient is from: SNF              Anticipated d/c is to: SNF              Anticipated d/c date is: 2 days  Patient currently is not medically stable to d/c.     Consultants:  Psychiatry  General  surgery  Procedures:  None  Antimicrobials:  None  DVT prophylaxis: Lovenox   Objective: Vitals:   01/27/20 0500 01/27/20 0500 01/27/20 0802 01/27/20 1507  BP:  124/64  118/66  Pulse:  71  77  Resp:  20  20  Temp:  98.6 F (37 C)  98.2 F (36.8 C)  TempSrc:  Oral  Oral  SpO2:  100% 97% 100%  Weight: 99.2 kg     Height:        Intake/Output Summary (Last 24 hours) at 01/27/2020 1852 Last data filed at 01/27/2020 1110 Gross per 24 hour  Intake 120 ml  Output 500 ml  Net -380 ml   Filed Weights   01/25/20 0625 01/26/20 0500 01/27/20 0500  Weight: (!) 102 kg 99.9 kg 99.2 kg    Exam:  General: NAD   Cardiovascular: S1, S2 present  Respiratory: CTAB  Abdomen: Soft, nontender, obese, nondistended, bowel sounds present  Musculoskeletal: No bilateral pedal edema noted  Skin: Normal  Psychiatry:  Paranoia    Data Reviewed: CBC: Recent Labs  Lab 01/24/20 1849 01/25/20 0425 01/26/20 0411 01/27/20 0428  WBC 8.1 6.3 8.9 8.9  NEUTROABS 5.9 4.6 6.2 6.6  HGB 12.1 11.6* 11.3* 11.2*  HCT 35.5* 33.2* 34.6* 34.0*  MCV 83.9 84.1 87.2 85.6  PLT 195 168 198 314   Basic Metabolic Panel: Recent Labs  Lab 01/24/20 1849 01/25/20 0425 01/26/20 0411 01/27/20 0428  NA 125* 126* 124* 123*  K 4.1 3.8 4.3 4.8  CL 81* 87* 82* 83*  CO2 31 31 32 28  GLUCOSE 116* 140* 154* 110*  BUN 21* 18 20 35*  CREATININE 0.79 0.77 0.93 1.21*  CALCIUM 9.1 8.7* 8.9 9.0   GFR: Estimated Creatinine Clearance: 59.1 mL/min (A) (by C-G formula based on SCr of 1.21 mg/dL (H)). Liver Function Tests: Recent Labs  Lab 01/24/20 1849  AST 17  ALT 10  ALKPHOS 60  BILITOT 0.6  PROT 7.9  ALBUMIN 3.8   Recent Labs  Lab 01/24/20 1849  LIPASE 25   No results for input(s): AMMONIA in the last 168 hours. Coagulation Profile: No results for input(s): INR, PROTIME in the last 168 hours. Cardiac Enzymes: No results for input(s): CKTOTAL, CKMB, CKMBINDEX, TROPONINI in the last 168  hours. BNP (last 3 results) No results for input(s): PROBNP in the last 8760 hours. HbA1C: No results for input(s): HGBA1C in the last 72 hours. CBG: No results for input(s): GLUCAP in the last 168 hours. Lipid Profile: No results for input(s): CHOL, HDL, LDLCALC, TRIG, CHOLHDL, LDLDIRECT in the last 72 hours. Thyroid Function Tests: No results for input(s): TSH, T4TOTAL, FREET4, T3FREE, THYROIDAB in the last 72 hours. Anemia Panel: No results for input(s): VITAMINB12, FOLATE, FERRITIN, TIBC, IRON, RETICCTPCT in the last 72 hours. Urine analysis:    Component Value Date/Time   COLORURINE COLORLESS (A) 01/24/2020 1920   APPEARANCEUR CLEAR (A) 01/24/2020 1920   APPEARANCEUR Clear 06/22/2012 0618   LABSPEC 1.003 (L) 01/24/2020 1920   LABSPEC 1.003 06/22/2012 0618   PHURINE 8.0 01/24/2020 1920   GLUCOSEU NEGATIVE 01/24/2020 1920   GLUCOSEU Negative 06/22/2012 0618   GLUCOSEU NEGATIVE 04/09/2011 [place] 01/24/2020 1920   BILIRUBINUR NEGATIVE 01/24/2020 1920   BILIRUBINUR Negative 06/22/2012 0618   KETONESUR NEGATIVE 01/24/2020 1920   PROTEINUR NEGATIVE 01/24/2020 1920   UROBILINOGEN 1.0 08/18/2011 1805  NITRITE NEGATIVE 01/24/2020 1920   LEUKOCYTESUR TRACE (A) 01/24/2020 1920   LEUKOCYTESUR Trace 06/22/2012 0618   Sepsis Labs: @LABRCNTIP (procalcitonin:4,lacticidven:4)  ) Recent Results (from the past 240 hour(s))  SARS Coronavirus 2 by RT PCR (hospital order, performed in [facility] hospital lab) Nasopharyngeal Nasopharyngeal Swab     Status: None   Collection Time: 01/24/20  7:49 PM   Specimen: Nasopharyngeal Swab  Result Value Ref Range Status   SARS Coronavirus 2 NEGATIVE NEGATIVE Final    Comment: (NOTE) SARS-CoV-2 target nucleic acids are NOT DETECTED.  The SARS-CoV-2 RNA is generally detectable in upper and lower respiratory specimens during the acute phase of infection. The lowest concentration of SARS-CoV-2 viral copies this assay can detect is  250 copies / mL. A negative result does not preclude SARS-CoV-2 infection and should not be used as the sole basis for treatment or other patient management decisions.  A negative result may occur with improper specimen collection / handling, submission of specimen other than nasopharyngeal swab, presence of viral mutation(s) within the areas targeted by this assay, and inadequate number of viral copies (<250 copies / mL). A negative result must be combined with clinical observations, patient history, and epidemiological information.  Fact Sheet for Patients:   StrictlyIdeas.no  Fact Sheet for Healthcare Providers: BankingDealers.co.za  This test is not yet approved or  cleared by the [place] FDA and has been authorized for detection and/or diagnosis of SARS-CoV-2 by FDA under an Emergency Use Authorization (EUA).  This EUA will remain in effect (meaning this test can be used) for the duration of the COVID-19 declaration under Section 564(b)(1) of the Act, 21 U.S.C. section 360bbb-3(b)(1), unless the authorization is terminated or revoked sooner.  Performed at [facility], [name] [place], [place] [address]   MRSA PCR Screening     Status: Abnormal   Collection Time: 01/25/20  1:37 AM   Specimen: Nasopharyngeal  Result Value Ref Range Status   MRSA by PCR POSITIVE (A) NEGATIVE Final    Comment:        The GeneXpert MRSA Assay (FDA approved for NASAL specimens only), is one component of a comprehensive MRSA colonization surveillance program. It is not intended to diagnose MRSA infection nor to guide or monitor treatment for MRSA infections. RESULT CALLED TO, READ BACK BY AND VERIFIED WITH: stacey clay at [place] on 01/25/20 rww Performed at [facility], [address] [place], [place] [address]       Studies: No results found.  Scheduled Meds: . bisacodyl  10 mg Oral Daily  . buPROPion   150 mg Oral Daily  . Chlorhexidine Gluconate Cloth  6 each Topical Q0600  . clonazePAM  0.5 mg Oral BID  . divalproex  500 mg Oral BID  . docusate sodium  100 mg Oral BID  . enoxaparin (LOVENOX) injection  40 mg Subcutaneous Q24H  . fluticasone furoate-vilanterol  1 puff Inhalation Daily  . ipratropium-albuterol  3 mL Nebulization TID  . lisinopril  10 mg Oral Daily  . lurasidone  20 mg Oral BID  . metoprolol succinate  12.5 mg Oral Daily  . mupirocin ointment  1 application Nasal BID  . nicotine  21 mg Transdermal Daily  . NIFEdipine  60 mg Oral Daily  . OLANZapine  10 mg Oral BID  . pantoprazole  40 mg Oral Daily  . polyethylene glycol  17 g Oral BID  . sodium chloride flush  3 mL Intravenous Q12H  . sucralfate  1 g Oral TID WC & HS    Continuous Infusions: . sodium chloride       LOS: 2 days     Alma Friendly, MD [facility]  If 7PM-7AM, please contact night-coverage [email or web address] 01/27/2020, 6:52 PM

## 2020-01-28 LAB — OSMOLALITY, URINE: Osmolality, Ur: 333 mOsm/kg (ref 300–900)

## 2020-01-28 LAB — BASIC METABOLIC PANEL
Anion gap: 12 (ref 5–15)
BUN: 45 mg/dL — ABNORMAL HIGH (ref 6–20)
CO2: 27 mmol/L (ref 22–32)
Calcium: 8.9 mg/dL (ref 8.9–10.3)
Chloride: 80 mmol/L — ABNORMAL LOW (ref 98–111)
Creatinine, Ser: 1.4 mg/dL — ABNORMAL HIGH (ref 0.44–1.00)
GFR calc Af Amer: 48 mL/min — ABNORMAL LOW (ref >60)
GFR calc non Af Amer: 41 mL/min — ABNORMAL LOW (ref >60)
Glucose, Bld: 116 mg/dL — ABNORMAL HIGH (ref 70–99)
Potassium: 5.5 mmol/L — ABNORMAL HIGH (ref 3.5–5.1)
Sodium: 119 mmol/L — CL (ref 135–145)

## 2020-01-28 LAB — SODIUM, URINE, RANDOM: Sodium, Ur: <10 mmol/L

## 2020-01-28 LAB — URIC ACID: Uric Acid, Serum: 7.1 mg/dL (ref 2.5–7.1)

## 2020-01-28 LAB — OSMOLALITY: Osmolality: 265 mOsm/kg — ABNORMAL LOW (ref 275–295)

## 2020-01-28 LAB — CORTISOL: Cortisol, Plasma: 15.6 ug/dL

## 2020-01-28 LAB — TSH: TSH: 1.918 u[IU]/mL (ref 0.350–4.500)

## 2020-01-28 MED ORDER — SODIUM ZIRCONIUM CYCLOSILICATE 10 G PO PACK
10.0000 g | PACK | Freq: Every day | ORAL | Status: DC
  Administered 2020-01-28 – 2020-02-01 (×5): 10 g via ORAL
  Filled 2020-01-28 (×5): qty 1

## 2020-01-28 MED ORDER — SODIUM CHLORIDE 0.9 % IV SOLN: INTRAVENOUS | Status: DC

## 2020-01-28 NOTE — TOC Progression Note (Signed)
Transition of Care [facility]) - Progression Note    Patient Details  Name: HAZLEY DEZEEUW MRN: [id number] Date of Birth: 31-Jul-1960  Transition of Care [facility]) CM/SW Contact  Beverly Sessions, RN Phone Number: 01/28/2020, 12:46 PM  Clinical Narrative:    Level 2 PASRR obtained [id number] F   Expected Discharge Plan: Long Term Nursing Home Barriers to Discharge: No Barriers Identified  Expected Discharge Plan and Services Expected Discharge Plan: [place]   Discharge Planning Services: CM Consult   Living arrangements for the past 2 months: [place]                                       Social Determinants of Health (SDOH) Interventions    Readmission Risk Interventions Readmission Risk Prevention Plan 01/25/2020 10/19/2019 09/15/2019  Transportation Screening Complete Complete Complete  Medication Review [employer] photographer) Complete Complete Complete  PCP or Specialist appointment within 3-5 days of discharge - Complete Complete  HRI or Home Care Consult - Complete Complete  HRI or Home Care Consult Pt Refusal Comments - - -  SW Recovery Care/Counseling Consult - - -  Palliative Care Screening Not Applicable - -  Skilled Nursing Facility Complete Complete Complete  Some recent data might be hidden

## 2020-01-28 NOTE — Progress Notes (Signed)
PROGRESS NOTE  Natalie Thomas MGQ:[id number] DOB: 1960-07-29 DOA: 01/24/2020 PCP: System, Pcp Not In  HPI/Recap of past 24 hours: HPI from Dr Ginnie Smart Spiresis a 59 y.o.femalewith medical history significant forvulvar cancer, COPD, chronic hypoxic respiratory failure, schizoaffective disorder, chronic hyponatremia, hypertension, and chronic diastolic CHF, now presenting to the emergency department for evaluation of several complaints including nausea, malaise, and abdominal discomfort. Patient has a difficult time providing history but indicates longstanding cramping discomfort in her abdomen that has recently worsened, nausea, and episode of nonbloody vomiting yesterday morning, and general malaise. She denies any fevers, worsening shortness of breath or cough, or chest pain. In the ED, patient is found to be afebrile, saturating well on her usual 4 L/min of supplemental oxygen, and with stable blood pressure. EKG features sinus rhythm with LVH. Chest x-ray notable for chronic cardiomegaly and vascular congestion with improved pulmonary edema from prior study. CT of the abdomen and pelvis demonstrates some increase in fat and bowel-containing umbilical hernia but without any obstruction or vascular compromise. Chemistry panel notable for sodium of 125, down from 130 in May 2021. CBC unremarkable. Troponin slightly elevated. Urinalysis with low specific gravity. Covid PCR is negative. Patient was given 150 cc of saline, fentanyl, and Ativan in the ED and hospitalist asked to admit for hyponatremia.    Today, pt denies any new complains, continues to c/o chronic abdominal pain, requesting more pain meds. Noted worsening hyponatremia. 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 Sodium continues to dip downwards, likely now from possible vol depletion Nephrology consulted, started on gentle hydration  Continue to hold Lasix, HCTZ due to rising creatinine Daily DMP  Chronic abdominal pain Lipase 25, LFTs WNL Afebrile, with no leukocytosis UA with trace amount of leukocytes, with low specific gravity CT abdomen/pelvis showed fat and bowel-containing umbilical hernia increased in size, without evidence of mechanical obstruction or vascular compromise General surgery consulted, no further recs, not a surgical candidate Pain management, monitor closely  Chronic diastolic HF Continue beta-blocker, nifedipine Held Lasix, lisinopril for now due to AKI  Hypertension Stable Continue meds as above  Chronic hypoxic respiratory failure History of COPD Continue supplemental oxygen  Schizoaffective disorder Continue Zyprexa, Depakote, Wellbutrin, Klonopin, Latuda  Obesity Lifestyle modification advised        Malnutrition Type:      Malnutrition Characteristics:      Nutrition Interventions:       Estimated body mass index is 36.39 kg/m as calculated from the following:   Height as of this encounter: 5\' 5"  (1.651 m).   Weight as of this encounter: 99.2 kg.     Code Status: DNR  Family Communication: None at bedside  Disposition Plan: Status is: Inpatient  Remains inpatient appropriate because:Inpatient level of care appropriate due to severity of illness   Dispo: The patient is from: SNF              Anticipated d/c is to: SNF  Anticipated d/c date is: 2 days              Patient currently is not medically stable  to d/c.     Consultants:  Psychiatry  General surgery  Nephrology   Procedures:  None  Antimicrobials:  None  DVT prophylaxis: Lovenox   Objective: Vitals:   01/27/20 2019 01/27/20 2019 01/28/20 0550 01/28/20 1138  BP:  102/85 104/64 (!) 198/59  Pulse:  80 86 82  Resp:  16 18 20   Temp:  98.5 F (36.9 C) 98.4 F (36.9 C) 98.2 F (36.8 C)  TempSrc:   Oral Oral  SpO2: 94% 96% 96% 99%  Weight:      Height:        Intake/Output Summary (Last 24 hours) at 01/28/2020 1644 Last data filed at 01/28/2020 1300 Gross per 24 hour  Intake 480 ml  Output 850 ml  Net -370 ml   Filed Weights   01/25/20 0625 01/26/20 0500 01/27/20 0500  Weight: (!) 102 kg 99.9 kg 99.2 kg    Exam:  General: NAD   Cardiovascular: S1, S2 present  Respiratory: CTAB  Abdomen: Soft, nontender, obese, nondistended, bowel sounds present  Musculoskeletal: No bilateral pedal edema noted  Skin: Normal  Psychiatry:  Paranoia    Data Reviewed: CBC: Recent Labs  Lab 01/24/20 1849 01/25/20 0425 01/26/20 0411 01/27/20 0428  WBC 8.1 6.3 8.9 8.9  NEUTROABS 5.9 4.6 6.2 6.6  HGB 12.1 11.6* 11.3* 11.2*  HCT 35.5* 33.2* 34.6* 34.0*  MCV 83.9 84.1 87.2 85.6  PLT 195 168 198 517   Basic Metabolic Panel: Recent Labs  Lab 01/24/20 1849 01/25/20 0425 01/26/20 0411 01/27/20 0428 01/28/20 0539  NA 125* 126* 124* 123* 119*  K 4.1 3.8 4.3 4.8 5.5*  CL 81* 87* 82* 83* 80*  CO2 31 31 32 28 27  GLUCOSE 116* 140* 154* 110* 116*  BUN 21* 18 20 35* 45*  CREATININE 0.79 0.77 0.93 1.21* 1.40*  CALCIUM 9.1 8.7* 8.9 9.0 8.9   GFR: Estimated Creatinine Clearance: 51.1 mL/min (A) (by C-G formula based on SCr of 1.4 mg/dL (H)). Liver Function Tests: Recent Labs  Lab 01/24/20 1849  AST 17  ALT 10  ALKPHOS 60  BILITOT 0.6  PROT 7.9  ALBUMIN 3.8   Recent Labs  Lab 01/24/20 1849  LIPASE 25   No results for input(s): AMMONIA in the last 168 hours. Coagulation Profile: No results  for input(s): INR, PROTIME in the last 168 hours. Cardiac Enzymes: No results for input(s): CKTOTAL, CKMB, CKMBINDEX, TROPONINI in the last 168 hours. BNP (last 3 results) No results for input(s): PROBNP in the last 8760 hours. HbA1C: No results for input(s): HGBA1C in the last 72 hours. CBG: No results for input(s): GLUCAP in the last 168 hours. Lipid Profile: No results for input(s): CHOL, HDL, LDLCALC, TRIG, CHOLHDL, LDLDIRECT in the last 72 hours. Thyroid Function Tests: Recent Labs    01/28/20 0947  TSH 1.918   Anemia Panel: No results for input(s): VITAMINB12, FOLATE, FERRITIN, TIBC, IRON, RETICCTPCT in the last 72 hours. Urine analysis:    Component Value Date/Time   COLORURINE COLORLESS (A) 01/24/2020 1920   APPEARANCEUR CLEAR (A) 01/24/2020 1920   APPEARANCEUR Clear 06/22/2012 0618   LABSPEC 1.003 (L) 01/24/2020 1920   LABSPEC 1.003 06/22/2012 0618   PHURINE 8.0 01/24/2020 1920   GLUCOSEU NEGATIVE 01/24/2020 1920   GLUCOSEU Negative 06/22/2012 0618   GLUCOSEU NEGATIVE 04/09/2011 1058   HGBUR NEGATIVE 01/24/2020  [place] 01/24/2020 1920   BILIRUBINUR Negative 06/22/2012 0618   KETONESUR NEGATIVE 01/24/2020 1920   PROTEINUR NEGATIVE 01/24/2020 1920   UROBILINOGEN 1.0 08/18/2011 1805   NITRITE NEGATIVE 01/24/2020 1920   LEUKOCYTESUR TRACE (A) 01/24/2020 1920   LEUKOCYTESUR Trace 06/22/2012 0618   Sepsis Labs: @LABRCNTIP (procalcitonin:4,lacticidven:4)  ) Recent Results (from the past 240 hour(s))  SARS Coronavirus 2 by RT PCR (hospital order, performed in [place] hospital lab) Nasopharyngeal Nasopharyngeal Swab     Status: None   Collection Time: 01/24/20  7:49 PM   Specimen: Nasopharyngeal Swab  Result Value Ref Range Status   SARS Coronavirus 2 NEGATIVE NEGATIVE Final    Comment: (NOTE) SARS-CoV-2 target nucleic acids are NOT DETECTED.  The SARS-CoV-2 RNA is generally detectable in upper and lower respiratory specimens during the  acute phase of infection. The lowest concentration of SARS-CoV-2 viral copies this assay can detect is 250 copies / mL. A negative result does not preclude SARS-CoV-2 infection and should not be used as the sole basis for treatment or other patient management decisions.  A negative result may occur with improper specimen collection / handling, submission of specimen other than nasopharyngeal swab, presence of viral mutation(s) within the areas targeted by this assay, and inadequate number of viral copies (<250 copies / mL). A negative result must be combined with clinical observations, patient history, and epidemiological information.  Fact Sheet for Patients:   StrictlyIdeas.no  Fact Sheet for Healthcare Providers: BankingDealers.co.za  This test is not yet approved or  cleared by the [place] FDA and has been authorized for detection and/or diagnosis of SARS-CoV-2 by FDA under an Emergency Use Authorization (EUA).  This EUA will remain in effect (meaning this test can be used) for the duration of the COVID-19 declaration under Section 564(b)(1) of the Act, 21 U.S.C. section 360bbb-3(b)(1), unless the authorization is terminated or revoked sooner.  Performed at [facility], [address] [place], [place] [address]   MRSA PCR Screening     Status: Abnormal   Collection Time: 01/25/20  1:37 AM   Specimen: Nasopharyngeal  Result Value Ref Range Status   MRSA by PCR POSITIVE (A) NEGATIVE Final    Comment:        The GeneXpert MRSA Assay (FDA approved for NASAL specimens only), is one component of a comprehensive MRSA colonization surveillance program. It is not intended to diagnose MRSA infection nor to guide or monitor treatment for MRSA infections. RESULT CALLED TO, READ BACK BY AND VERIFIED WITH: stacey clay at [place] on 01/25/20 rww Performed at [facility], [address] [place], [place]  [address]       Studies: No results found.  Scheduled Meds: . bisacodyl  10 mg Oral Daily  . buPROPion  150 mg Oral Daily  . Chlorhexidine Gluconate Cloth  6 each Topical Q0600  . clonazePAM  0.5 mg Oral BID  . divalproex  500 mg Oral BID  . docusate sodium  100 mg Oral BID  . enoxaparin (LOVENOX) injection  40 mg Subcutaneous Q24H  . fluticasone furoate-vilanterol  1 puff Inhalation Daily  . ipratropium-albuterol  3 mL Nebulization TID  . lurasidone  20 mg Oral BID  . metoprolol succinate  12.5 mg Oral Daily  . mupirocin ointment  1 application Nasal BID  . nicotine  21 mg Transdermal Daily  . NIFEdipine  60 mg Oral Daily  . OLANZapine  10 mg Oral BID  . pantoprazole  40  mg Oral Daily  . polyethylene glycol  17 g Oral BID  . sodium chloride flush  3 mL Intravenous Q12H  . sodium zirconium cyclosilicate  10 g Oral Daily  . sucralfate  1 g Oral TID WC & HS    Continuous Infusions: . sodium chloride    . sodium chloride 75 mL/hr at 01/28/20 0956     LOS: 3 days     Alma Friendly, MD [facility]  If 7PM-7AM, please contact night-coverage [email or web address] 01/28/2020, 4:44 PM

## 2020-01-28 NOTE — Consult Note (Signed)
[facility] CONSULT NOTE    Date: 01/28/2020                  Patient Name:  Natalie Thomas  MRN: [id number]  DOB: 1960/12/04  Age / Sex: 59 y.o., female         PCP: System, Pcp Not In                 Service Requesting Consult:  Hospitalist                 Reason for Consult:  Hyponatremia            History of Present Illness: Patient is a 59 y.o. female with a PMHx of congestive heart failure, acute on chronic hyponatremia, COPD, depression, diabetes mellitus type 2, hypertension, GERD, hyperlipidemia, vulvar cancer, schizoaffective disorder, history of psychogenic polydipsia who was admitted to [facility] on 01/24/2020 for evaluation of nausea, malaise, and abdominal cramping.  She is a rather poor historian given her history of schizoaffective disorder and has some difficulties concentrating on questions posed during interview.  We are asked to see her for evaluation management of worsening hyponatremia.  She appears to have a significant chronic component as well.  Serum sodium this a.m. was found to be low at 119.  However potassium was also up to 5.5.  In addition BUN and creatinine were higher at 45 and 1.4.   Medications: Outpatient medications: Medications Prior to Admission  Medication Sig Dispense Refill Last Dose  . acetaminophen (TYLENOL) 500 MG tablet Take 1,000 mg by mouth every 8 (eight) hours as needed for mild pain or moderate pain. Not to exceed 3g/24h of tylenol from all sources.   prn at prn  . albuterol (PROVENTIL) (2.5 MG/3ML) 0.083% nebulizer solution Take 3 mLs (2.5 mg total) by nebulization every 4 (four) hours as needed for wheezing or shortness of breath. 75 mL 12 prn at prn  . albuterol (VENTOLIN HFA) 108 (90 Base) MCG/ACT inhaler Inhale 2 puffs into the lungs every 6 (six) hours as needed for wheezing or shortness of breath.   prn at prn  . Alum & Mag Hydroxide-Simeth (ANTACID LIQUID PO) Take 30 mLs by mouth every 6 (six) hours as needed  (heartburn).   prn at prn  . ascorbic acid (VITAMIN C) 1000 MG tablet Take 1 tablet (1,000 mg total) by mouth daily.   01/24/2020 at Unknown time  . buPROPion (WELLBUTRIN XL) 150 MG 24 hr tablet Take 150 mg by mouth daily.   01/24/2020 at Unknown time  . Calcium Carb-Cholecalciferol 600-400 MG-UNIT TABS Take 1 tablet by mouth daily.   01/24/2020 at Unknown time  . chlorthalidone (HYGROTON) 50 MG tablet Take 1 tablet (50 mg total) by mouth daily. 30 tablet 0 01/24/2020 at Unknown time  . clonazePAM (KLONOPIN) 1 MG tablet Take 0.5 tablets (0.5 mg total) by mouth 2 (two) times daily. 15 tablet 0 01/24/2020 at Unknown time  . divalproex (DEPAKOTE ER) 250 MG 24 hr tablet Take 250 mg by mouth 2 (two) times daily.   01/24/2020 at Unknown time  . docusate sodium (COLACE) 100 MG capsule Take 100 mg by mouth 2 (two) times daily.    01/24/2020 at Unknown time  . fluticasone furoate-vilanterol (BREO ELLIPTA) 100-25 MCG/INH AEPB Inhale 1 puff into the lungs daily.   01/24/2020 at Unknown time  . furosemide (LASIX) 20 MG tablet Take 3 tablets (60 mg total) by mouth 2 (two) times daily. (Patient taking  differently: Take 20 mg by mouth 2 (two) times daily. ) 30 tablet 0 01/24/2020 at Unknown time  . guaiFENesin (ROBITUSSIN) 100 MG/5ML SOLN Take 15 mLs by mouth every 4 (four) hours as needed for cough or to loosen phlegm.   prn at prn  . hydrOXYzine (ATARAX/VISTARIL) 25 MG tablet Take 25 mg by mouth every 6 (six) hours as needed for anxiety or itching.   prn at prn  . ipratropium-albuterol (DUONEB) 0.5-2.5 (3) MG/3ML SOLN Take 3 mLs by nebulization 3 (three) times daily. 360 mL 0 01/24/2020 at Unknown time  . lisinopril (ZESTRIL) 10 MG tablet Take 1 tablet (10 mg total) by mouth daily. 30 tablet 0 01/24/2020 at Unknown time  . loratadine (CLARITIN) 10 MG tablet Take 10 mg by mouth daily.   01/24/2020 at Unknown time  . lurasidone (LATUDA) 20 MG TABS tablet Take 1 tablet (20 mg total) by mouth 2 (two) times daily.   01/24/2020 at Unknown time   . magnesium hydroxide (MILK OF MAGNESIA) 400 MG/5ML suspension Take 30 mLs by mouth daily as needed for mild constipation or moderate constipation. 355 mL 0 prn at prn  . Melatonin 5 MG TABS Take 5 mg by mouth at bedtime.   01/23/2020 at Unknown time  . metoprolol succinate (TOPROL-XL) 25 MG 24 hr tablet Take 0.5 tablets (12.5 mg total) by mouth daily. 30 tablet 0 01/24/2020 at Unknown time  . Multiple Vitamin (MULTIVITAMIN WITH MINERALS) TABS tablet Take 1 tablet by mouth daily.   01/24/2020 at Unknown time  . neomycin-bacitracin-polymyxin (NEOSPORIN) ointment Apply 1 application topically as needed for wound care.   prn at prn  . nicotine (NICODERM CQ - DOSED IN MG/24 HOURS) 21 mg/24hr patch Place 21 mg onto the skin daily.    01/24/2020 at Unknown time  . NIFEdipine (ADALAT CC) 60 MG 24 hr tablet Take 1 tablet (60 mg total) by mouth daily. 30 tablet 0 01/24/2020 at Unknown time  . nystatin (NYSTATIN) powder Apply 1 application topically 2 (two) times daily as needed (irritation). Apply topically to reddened area twice daily as needed for irritation   prn at prn  . OLANZapine (ZYPREXA) 10 MG tablet Take 10 mg by mouth 2 (two) times daily.   01/24/2020 at Unknown time  . ondansetron (ZOFRAN) 8 MG tablet Take 8 mg by mouth every 6 (six) hours as needed for nausea or vomiting.   prn at prn  . pantoprazole (PROTONIX) 40 MG tablet Take 1 tablet (40 mg total) by mouth daily. 90 tablet 1 01/24/2020 at Unknown time  . polyethylene glycol (MIRALAX) 17 g packet Take 17 g by mouth daily. (Patient taking differently: Take 17 g by mouth daily. Mix 1 capful (17 g) in 4-8 oz of water or juice and give by mouth once daily for bowel regimen) 14 each 0 01/24/2020 at Unknown time  . sodium chloride (OCEAN) 0.65 % SOLN nasal spray Place 2 sprays into both nostrils every 2 (two) hours as needed (dryness, irritation).   prn at prn  . sucralfate (CARAFATE) 1 GM/10ML suspension Take 10 mLs (1 g total) by mouth 4 (four) times daily -   with meals and at bedtime. 420 mL 1 01/24/2020 at Unknown time  . aspirin EC 81 MG EC tablet Take 1 tablet (81 mg total) by mouth daily. (Patient not taking: Reported on 01/24/2020) 30 tablet 0 Not Taking at Unknown time  . feeding supplement, ENSURE ENLIVE, (ENSURE ENLIVE) LIQD Take 237 mLs by mouth daily. (Patient  not taking: Reported on 01/24/2020) 237 mL 12 Not Taking at Unknown time    Current medications: Current Facility-Administered Medications  Medication Dose Route Frequency Provider Last Rate Last Admin  . 0.9 %  sodium chloride infusion  250 mL Intravenous PRN Opyd, Ilene Qua, MD      . 0.9 %  sodium chloride infusion   Intravenous Continuous Holley Raring, Georjean Toya, MD 75 mL/hr at 01/28/20 0956 New Bag at 01/28/20 0956  . acetaminophen (TYLENOL) tablet 650 mg  650 mg Oral Q6H PRN Opyd, Ilene Qua, MD   650 mg at 01/25/20 0053   Or  . acetaminophen (TYLENOL) suppository 650 mg  650 mg Rectal Q6H PRN Opyd, Ilene Qua, MD      . albuterol (PROVENTIL) (2.5 MG/3ML) 0.083% nebulizer solution 2.5 mg  2.5 mg Inhalation Q6H PRN Opyd, Ilene Qua, MD   2.5 mg at 01/26/20 0241  . bisacodyl (DULCOLAX) EC tablet 10 mg  10 mg Oral Daily Georgette Shell, MD   10 mg at 01/28/20 0916  . buPROPion (WELLBUTRIN XL) 24 hr tablet 150 mg  150 mg Oral Daily Opyd, Ilene Qua, MD   150 mg at 01/28/20 0917  . Chlorhexidine Gluconate Cloth 2 % PADS 6 each  6 each Topical Q0600 Opyd, Ilene Qua, MD   6 each at 01/28/20 0533  . clonazePAM (KLONOPIN) tablet 0.5 mg  0.5 mg Oral BID Opyd, Ilene Qua, MD   0.5 mg at 01/28/20 0916  . dicyclomine (BENTYL) tablet 20 mg  20 mg Oral TID PRN Sharion Settler, NP   20 mg at 01/26/20 2104  . divalproex (DEPAKOTE ER) 24 hr tablet 500 mg  500 mg Oral BID Clapacs, Madie Reno, MD   500 mg at 01/28/20 0917  . docusate sodium (COLACE) capsule 100 mg  100 mg Oral BID Vianne Bulls, MD   100 mg at 01/28/20 0916  . enoxaparin (LOVENOX) injection 40 mg  40 mg Subcutaneous Q24H Alma Friendly, MD    40 mg at 01/27/20 2117  . fluticasone furoate-vilanterol (BREO ELLIPTA) 100-25 MCG/INH 1 puff  1 puff Inhalation Daily Opyd, Ilene Qua, MD   1 puff at 01/28/20 0916  . guaiFENesin (ROBITUSSIN) 100 MG/5ML solution 300 mg  15 mL Oral Q4H PRN Opyd, Ilene Qua, MD   300 mg at 01/25/20 2219  . hydrOXYzine (ATARAX/VISTARIL) tablet 10 mg  10 mg Oral TID PRN Georgette Shell, MD   10 mg at 01/28/20 0605  . ipratropium-albuterol (DUONEB) 0.5-2.5 (3) MG/3ML nebulizer solution 3 mL  3 mL Nebulization TID Opyd, Ilene Qua, MD   3 mL at 01/28/20 0751  . lurasidone (LATUDA) tablet 20 mg  20 mg Oral BID Opyd, Ilene Qua, MD   20 mg at 01/28/20 0916  . metoprolol succinate (TOPROL-XL) 24 hr tablet 12.5 mg  12.5 mg Oral Daily Opyd, Ilene Qua, MD   12.5 mg at 01/28/20 0917  . mupirocin ointment (BACTROBAN) 2 % 1 application  1 application Nasal BID Opyd, Ilene Qua, MD   1 application at 84/53/64 2117  . neomycin-bacitracin-polymyxin (NEOSPORIN) ointment packet 1 application  1 application Topical PRN Opyd, Ilene Qua, MD   1 application at 68/03/21 1529  . nicotine (NICODERM CQ - dosed in mg/24 hours) patch 21 mg  21 mg Transdermal Daily Opyd, Ilene Qua, MD   21 mg at 01/28/20 0919  . NIFEdipine (ADALAT CC) 24 hr tablet 60 mg  60 mg Oral Daily Opyd, Ilene Qua, MD  60 mg at 01/28/20 0917  . OLANZapine (ZYPREXA) tablet 10 mg  10 mg Oral BID Opyd, Ilene Qua, MD   10 mg at 01/28/20 0917  . ondansetron (ZOFRAN) tablet 4 mg  4 mg Oral Q6H PRN Opyd, Ilene Qua, MD   4 mg at 01/28/20 4174   Or  . ondansetron (ZOFRAN) injection 4 mg  4 mg Intravenous Q6H PRN Opyd, Ilene Qua, MD      . oxyCODONE (Oxy IR/ROXICODONE) immediate release tablet 5 mg  5 mg Oral Q3H PRN Georgette Shell, MD   5 mg at 01/28/20 0605  . pantoprazole (PROTONIX) EC tablet 40 mg  40 mg Oral Daily Opyd, Ilene Qua, MD   40 mg at 01/28/20 0916  . polyethylene glycol (MIRALAX / GLYCOLAX) packet 17 g  17 g Oral BID Georgette Shell, MD   17 g at  01/28/20 0814  . polyvinyl alcohol (LIQUIFILM TEARS) 1.4 % ophthalmic solution 1 drop  1 drop Both Eyes PRN Opyd, Ilene Qua, MD      . sodium chloride flush (NS) 0.9 % injection 3 mL  3 mL Intravenous Q12H Opyd, Ilene Qua, MD   3 mL at 01/28/20 0918  . sodium zirconium cyclosilicate (LOKELMA) packet 10 g  10 g Oral Daily Alma Friendly, MD   10 g at 01/28/20 0915  . sucralfate (CARAFATE) 1 GM/10ML suspension 1 g  1 g Oral TID WC & HS Opyd, Ilene Qua, MD   1 g at 01/28/20 1205  . traZODone (DESYREL) tablet 50 mg  50 mg Oral QHS PRN Sharion Settler, NP   50 mg at 01/27/20 2117      Allergies: Allergies  Allergen Reactions  . Clarithromycin Itching  . Penicillins Itching    Has tolerated cefazolin before  Has patient had a PCN reaction causing immediate rash, facial/tongue/throat swelling, SOB or lightheadedness with hypotension: No Has patient had a PCN reaction causing severe rash involving mucus membranes or skin necrosis: No Has patient had a PCN reaction that required hospitalization: Unknown Has patient had a PCN reaction occurring within the last 10 years: No If all of the above answers are "NO", then may proceed with Cephalosporin use.       Past Medical History: Past Medical History:  Diagnosis Date  . Anemia 10/16/2017  . Anxiety and depression   . CHF (congestive heart failure) ([place])   . Chronic hyponatremia 04/08/2007   Qualifier: Diagnosis of  By: Marca Ancona RMA, Lucy    . Closed comminuted intertrochanteric fracture of right femur ([place]) 11/07/2017  . COPD 04/08/2007   Qualifier: Diagnosis of  By: Marca Ancona RMA, Lucy    . Depression   . Diabetes mellitus, type 2 ([place])    pt denies but states that she has been treated for DM  . Essential hypertension 04/08/2007   Qualifier: Diagnosis of  By: Marca Ancona RMA, Lucy    . GERD (gastroesophageal reflux disease)   . Hyperlipidemia 10/07/2017  . Lumbar disc disease 04/09/2011  . MENOPAUSE, PREMATURE 04/08/2007   Qualifier:  Diagnosis of  By: Marca Ancona RMA, Lucy    . 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 Osteoporosis 04/08/2007   Qualifier: Diagnosis of  By: Reatha Armour, Lucy    . Polycythemia secondary to smoking 04/09/2011  . 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 Schizoaffective disorder, bipolar type ([place]) 07/05/2011     Past Surgical History: Past Surgical History:  Procedure Laterality  Date  . BIOPSY  07/11/2019   Procedure: BIOPSY;  Surgeon: Jerene Bears, MD;  Location: [facility] ENDOSCOPY;  Service: Gastroenterology;;  . ESOPHAGOGASTRODUODENOSCOPY (EGD) WITH PROPOFOL N/A 07/11/2019   Procedure: ESOPHAGOGASTRODUODENOSCOPY (EGD) WITH PROPOFOL;  Surgeon: Jerene Bears, MD;  Location: [place];  Service: Gastroenterology;  Laterality: N/A;  . EYE SURGERY     on left  eye, pt states that she sees bad out of the right eye and needs surgery there  . INTRAMEDULLARY (IM) NAIL INTERTROCHANTERIC Right 11/13/2017   Procedure: INTRAMEDULLARY (IM) NAIL INTERTROCHANTRIC;  Surgeon: Shona Needles, MD;  Location: [facility];  Service: Orthopedics;  Laterality: Right;  . PELVIC FRACTURE SURGERY       Family History: History reviewed. No pertinent family history.   Social History: Social History   Socioeconomic History  . Marital status: Divorced    Spouse name: Not on file  . Number of children: Not on file  . Years of education: Not on file  . Highest education level: Not on file  Occupational History  . Occupation: Disabled  Tobacco Use  . Smoking status: Former Smoker    Packs/day: 0.25    Types: Cigarettes    Quit date: 07/23/2019    Years since quitting: 0.5  . Smokeless tobacco: Never Used  Vaping Use  . Vaping Use: Never used  Substance and Sexual Activity  . Alcohol use: No  . Drug use: No  . Sexual activity: Never  Other Topics Concern  . Not on file  Social History Narrative   single   Social Determinants of Health   Financial Resource Strain:    . Difficulty of Paying Living Expenses:   Food Insecurity:   . Worried About Charity fundraiser in the Last Year:   . Arboriculturist in the Last Year:   Transportation Needs:   . Film/video editor (Medical):   Marland Kitchen Lack of Transportation (Non-Medical):   Physical Activity:   . Days of Exercise per Week:   . Minutes of Exercise per Session:   Stress:   . Feeling of Stress :   Social Connections:   . Frequency of Communication with Friends and Family:   . Frequency of Social Gatherings with Friends and Family:   . Attends Religious Services:   . Active Member of Clubs or Organizations:   . Attends Archivist Meetings:   Marland Kitchen Marital Status:   Intimate Partner Violence:   . Fear of Current or Ex-Partner:   . Emotionally Abused:   Marland Kitchen Physically Abused:   . Sexually Abused:      Review of Systems: Unable to obtain accurate review of systems given her underlying schizoaffective disorder  Vital Signs: Blood pressure (!) 198/59, pulse 82, temperature 98.2 F (36.8 C), temperature source Oral, resp. rate 20, height 5\' 5"  (1.651 m), weight 99.2 kg, SpO2 99 %.  Weight trends: Filed Weights   01/25/20 0625 01/26/20 0500 01/27/20 0500  Weight: (!) 102 kg 99.9 kg 99.2 kg    Physical Exam: Physical Exam: General:  No acute distress  Head:  Normocephalic, atraumatic. Moist oral mucosal membranes  Eyes:  Anicteric  Neck:  Supple  Lungs:   Clear to auscultation, normal effort  Heart:  S1S2 no rubs  Abdomen:   Soft, nontender, bowel sounds present  Extremities:  No peripheral edema.  Neurologic:  Awake, alert, following commands, confused at times  Skin:  No lesions  GU:  No suprapubic tenderness  Lab results: Basic Metabolic Panel: Recent Labs  Lab 01/26/20 0411 01/27/20 0428 01/28/20 0539  NA 124* 123* 119*  K 4.3 4.8 5.5*  CL 82* 83* 80*  CO2 32 28 27  GLUCOSE 154* 110* 116*  BUN 20 35* 45*  CREATININE 0.93 1.21* 1.40*  CALCIUM 8.9 9.0 8.9     Liver Function Tests: Recent Labs  Lab 01/24/20 1849  AST 17  ALT 10  ALKPHOS 60  BILITOT 0.6  PROT 7.9  ALBUMIN 3.8   Recent Labs  Lab 01/24/20 1849  LIPASE 25   No results for input(s): AMMONIA in the last 168 hours.  CBC: Recent Labs  Lab 01/24/20 1849 01/24/20 1849 01/25/20 0425 01/26/20 0411 01/27/20 0428  WBC 8.1   < > 6.3 8.9 8.9  NEUTROABS 5.9   < > 4.6 6.2 6.6  HGB 12.1   < > 11.6* 11.3* 11.2*  HCT 35.5*   < > 33.2* 34.6* 34.0*  MCV 83.9  --  84.1 87.2 85.6  PLT 195   < > 168 198 185   < > = values in this interval not displayed.    Cardiac Enzymes: No results for input(s): CKTOTAL, CKMB, CKMBINDEX, TROPONINI in the last 168 hours.  BNP: Invalid input(s): POCBNP  CBG: No results for input(s): GLUCAP in the last 168 hours.  Microbiology: Results for orders placed or performed during the hospital encounter of 01/24/20  SARS Coronavirus 2 by RT PCR (hospital order, performed in [facility] hospital lab) Nasopharyngeal Nasopharyngeal Swab     Status: None   Collection Time: 01/24/20  7:49 PM   Specimen: Nasopharyngeal Swab  Result Value Ref Range Status   SARS Coronavirus 2 NEGATIVE NEGATIVE Final    Comment: (NOTE) SARS-CoV-2 target nucleic acids are NOT DETECTED.  The SARS-CoV-2 RNA is generally detectable in upper and lower respiratory specimens during the acute phase of infection. The lowest concentration of SARS-CoV-2 viral copies this assay can detect is 250 copies / mL. A negative result does not preclude SARS-CoV-2 infection and should not be used as the sole basis for treatment or other patient management decisions.  A negative result may occur with improper specimen collection / handling, submission of specimen other than nasopharyngeal swab, presence of viral mutation(s) within the areas targeted by this assay, and inadequate number of viral copies (<250 copies / mL). A negative result must be combined with clinical observations,  patient history, and epidemiological information.  Fact Sheet for Patients:   StrictlyIdeas.no  Fact Sheet for Healthcare Providers: BankingDealers.co.za  This test is not yet approved or  cleared by the [place] FDA and has been authorized for detection and/or diagnosis of SARS-CoV-2 by FDA under an Emergency Use Authorization (EUA).  This EUA will remain in effect (meaning this test can be used) for the duration of the COVID-19 declaration under Section 564(b)(1) of the Act, 21 U.S.C. section 360bbb-3(b)(1), unless the authorization is terminated or revoked sooner.  Performed at [facility], [address] [place], [place] [address]   MRSA PCR Screening     Status: Abnormal   Collection Time: 01/25/20  1:37 AM   Specimen: Nasopharyngeal  Result Value Ref Range Status   MRSA by PCR POSITIVE (A) NEGATIVE Final    Comment:        The GeneXpert MRSA Assay (FDA approved for NASAL specimens only), is one component of a comprehensive MRSA colonization surveillance program. It is not intended to diagnose MRSA infection nor to guide  or monitor treatment for MRSA infections. RESULT CALLED TO, READ BACK BY AND VERIFIED WITH: stacey clay at [place] on 01/25/20 rww Performed at [facility], [address] [place], [place] [address]     Coagulation Studies: No results for input(s): LABPROT, INR in the last 72 hours.  Urinalysis: No results for input(s): COLORURINE, LABSPEC, PHURINE, GLUCOSEU, HGBUR, BILIRUBINUR, KETONESUR, PROTEINUR, UROBILINOGEN, NITRITE, LEUKOCYTESUR in the last 72 hours.  Invalid input(s): APPERANCEUR    Imaging:  No results found.   Assessment & Plan: Pt is a 59 y.o. female with a PMHx of congestive heart failure, acute on chronic hyponatremia, COPD, depression, diabetes mellitus type 2, hypertension, GERD, hyperlipidemia, vulvar cancer, schizoaffective disorder, history of  psychogenic polydipsia who was admitted to [facility] on 01/24/2020 for evaluation of nausea, malaise, and abdominal cramping.  1.  Hyponatremia, suspect some component of volume loop patient given rising BUN and creatinine. 2.  Hyperkalemia serum potassium 5.5. 3.  Acute kidney injury with baseline creatinine of 0.7 and creatinine now up to 1.4.  Plan: Patient appears to have acute on chronic hyponatremia.  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  However currently BUN and creatinine rising and patient also with hyperkalemia therefore suggesting that there is some element of volume depletion.  This is likely the case given her acute GI complaints as well.  We will start the patient on 0.9 normal saline at 75 cc/h.  She was previously found to have a normal ejection fraction in April 2021 therefore I doubt that she would go into any form of significant volume overload with this level of hydration.  We also plan to recheck serum osmolality, urine osmolality, TSH, cortisol, and uric acid.  Agree with use of Lokelma to treat the hyperkalemia.  Further plan as patient progresses.  Thanks for consultation.

## 2020-01-28 NOTE — Progress Notes (Signed)
Critical sodium 119 reported to this nurse from outgoing shift. MD notified, will consult with nephrology. Patient is alert in room. Will continue to monitor.

## 2020-01-28 NOTE — Care Management Important Message (Signed)
Important Message  Patient Details  Name: Natalie Thomas MRN: [id number] Date of Birth: January 15, 1961   Medicare Important Message Given:  Yes     Juliann Pulse A Makayle Krahn 01/28/2020, 12:09 PM

## 2020-01-29 LAB — BASIC METABOLIC PANEL
Anion gap: 8 (ref 5–15)
BUN: 46 mg/dL — ABNORMAL HIGH (ref 6–20)
CO2: 28 mmol/L (ref 22–32)
Calcium: 8.3 mg/dL — ABNORMAL LOW (ref 8.9–10.3)
Chloride: 86 mmol/L — ABNORMAL LOW (ref 98–111)
Creatinine, Ser: 1.23 mg/dL — ABNORMAL HIGH (ref 0.44–1.00)
GFR calc Af Amer: 56 mL/min — ABNORMAL LOW (ref >60)
GFR calc non Af Amer: 48 mL/min — ABNORMAL LOW (ref >60)
Glucose, Bld: 100 mg/dL — ABNORMAL HIGH (ref 70–99)
Potassium: 5.1 mmol/L (ref 3.5–5.1)
Sodium: 122 mmol/L — ABNORMAL LOW (ref 135–145)

## 2020-01-29 NOTE — Progress Notes (Signed)
[facility]  ROUNDING NOTE   Subjective:  Patient seen and evaluated at bedside. Serum sodium currently 122. Creatinine down to 1.2. Potassium also down to 5.1.   Objective:  Vital signs in last 24 hours:  Temp:  [98.2 F (36.8 C)-99.3 F (37.4 C)] 98.2 F (36.8 C) (08/06 0556) Pulse Rate:  [80-87] 87 (08/06 0556) Resp:  [16-20] 20 (08/06 0556) BP: (103-127)/(71-72) 127/71 (08/06 0556) SpO2:  [97 %-100 %] 100 % (08/06 0556)  Weight change:  Filed Weights   01/25/20 0625 01/26/20 0500 01/27/20 0500  Weight: (!) 102 kg 99.9 kg 99.2 kg    Intake/Output: I/O last 3 completed shifts: In: 2053 [P.O.:720; I.V.:1333] Out: 2150 [Urine:2150]   Intake/Output this shift:  No intake/output data recorded.  Physical Exam: General:  No acute distress  Head:  Normocephalic, atraumatic. Moist oral mucosal membranes  Eyes:  Anicteric  Neck:  Supple  Lungs:   Clear to auscultation, normal effort  Heart:  S1S2 no rubs  Abdomen:   Soft, nontender, bowel sounds present  Extremities:  Trace peripheral edema.  Neurologic:  Awake, alert, following commands  Skin:  No lesions       Basic Metabolic Panel: Recent Labs  Lab 01/25/20 0425 01/25/20 0425 01/26/20 0411 01/26/20 0411 01/27/20 0428 01/28/20 0539 01/29/20 0420  NA 126*  --  124*  --  123* 119* 122*  K 3.8  --  4.3  --  4.8 5.5* 5.1  CL 87*  --  82*  --  83* 80* 86*  CO2 31  --  32  --  28 27 28   GLUCOSE 140*  --  154*  --  110* 116* 100*  BUN 18  --  20  --  35* 45* 46*  CREATININE 0.77  --  0.93  --  1.21* 1.40* 1.23*  CALCIUM 8.7*   < > 8.9   < > 9.0 8.9 8.3*   < > = values in this interval not displayed.    Liver Function Tests: Recent Labs  Lab 01/24/20 1849  AST 17  ALT 10  ALKPHOS 60  BILITOT 0.6  PROT 7.9  ALBUMIN 3.8   Recent Labs  Lab 01/24/20 1849  LIPASE 25   No results for input(s): AMMONIA in the last 168 hours.  CBC: Recent Labs  Lab 01/24/20 1849 01/25/20 0425  01/26/20 0411 01/27/20 0428  WBC 8.1 6.3 8.9 8.9  NEUTROABS 5.9 4.6 6.2 6.6  HGB 12.1 11.6* 11.3* 11.2*  HCT 35.5* 33.2* 34.6* 34.0*  MCV 83.9 84.1 87.2 85.6  PLT 195 168 198 185    Cardiac Enzymes: No results for input(s): CKTOTAL, CKMB, CKMBINDEX, TROPONINI in the last 168 hours.  BNP: Invalid input(s): POCBNP  CBG: No results for input(s): GLUCAP in the last 168 hours.  Microbiology: Results for orders placed or performed during the hospital encounter of 01/24/20  SARS Coronavirus 2 by RT PCR (hospital order, performed in [facility] hospital lab) Nasopharyngeal Nasopharyngeal Swab     Status: None   Collection Time: 01/24/20  7:49 PM   Specimen: Nasopharyngeal Swab  Result Value Ref Range Status   SARS Coronavirus 2 NEGATIVE NEGATIVE Final    Comment: (NOTE) SARS-CoV-2 target nucleic acids are NOT DETECTED.  The SARS-CoV-2 RNA is generally detectable in upper and lower respiratory specimens during the acute phase of infection. The lowest concentration of SARS-CoV-2 viral copies this assay can detect is 250 copies / mL. A negative result does not preclude SARS-CoV-2  infection and should not be used as the sole basis for treatment or other patient management decisions.  A negative result may occur with improper specimen collection / handling, submission of specimen other than nasopharyngeal swab, presence of viral mutation(s) within the areas targeted by this assay, and inadequate number of viral copies (<250 copies / mL). A negative result must be combined with clinical observations, patient history, and epidemiological information.  Fact Sheet for Patients:   StrictlyIdeas.no  Fact Sheet for Healthcare Providers: BankingDealers.co.za  This test is not yet approved or  cleared by the [place] FDA and has been authorized for detection and/or diagnosis of SARS-CoV-2 by FDA under an Emergency Use Authorization (EUA).   This EUA will remain in effect (meaning this test can be used) for the duration of the COVID-19 declaration under Section 564(b)(1) of the Act, 21 U.S.C. section 360bbb-3(b)(1), unless the authorization is terminated or revoked sooner.  Performed at [facility] [place] [facility], [address] [place], [place] [address]   MRSA PCR Screening     Status: Abnormal   Collection Time: 01/25/20  1:37 AM   Specimen: Nasopharyngeal  Result Value Ref Range Status   MRSA by PCR POSITIVE (A) NEGATIVE Final    Comment:        The GeneXpert MRSA Assay (FDA approved for NASAL specimens only), is one component of a comprehensive MRSA colonization surveillance program. It is not intended to diagnose MRSA infection nor to guide or monitor treatment for MRSA infections. RESULT CALLED TO, READ BACK BY AND VERIFIED WITH: stacey clay at [place] on 01/25/20 rww Performed at [facility], [address] [place], [place] [address]     Coagulation Studies: No results for input(s): LABPROT, INR in the last 72 hours.  Urinalysis: No results for input(s): COLORURINE, LABSPEC, PHURINE, GLUCOSEU, HGBUR, BILIRUBINUR, KETONESUR, PROTEINUR, UROBILINOGEN, NITRITE, LEUKOCYTESUR in the last 72 hours.  Invalid input(s): APPERANCEUR    Imaging: No results found.   Medications:   . sodium chloride    . sodium chloride 75 mL/hr at 01/29/20 1057   . bisacodyl  10 mg Oral Daily  . buPROPion  150 mg Oral Daily  . Chlorhexidine Gluconate Cloth  6 each Topical Q0600  . clonazePAM  0.5 mg Oral BID  . divalproex  500 mg Oral BID  . docusate sodium  100 mg Oral BID  . enoxaparin (LOVENOX) injection  40 mg Subcutaneous Q24H  . fluticasone furoate-vilanterol  1 puff Inhalation Daily  . ipratropium-albuterol  3 mL Nebulization TID  . lurasidone  20 mg Oral BID  . metoprolol succinate  12.5 mg Oral Daily  . mupirocin ointment  1 application Nasal BID  . nicotine  21 mg Transdermal Daily  . NIFEdipine  60  mg Oral Daily  . OLANZapine  10 mg Oral BID  . pantoprazole  40 mg Oral Daily  . polyethylene glycol  17 g Oral BID  . sodium chloride flush  3 mL Intravenous Q12H  . sodium zirconium cyclosilicate  10 g Oral Daily  . sucralfate  1 g Oral TID WC & HS   sodium chloride, acetaminophen **OR** acetaminophen, albuterol, dicyclomine, guaiFENesin, hydrOXYzine, neomycin-bacitracin-polymyxin, ondansetron **OR** ondansetron (ZOFRAN) IV, oxyCODONE, polyvinyl alcohol, traZODone  Assessment/ Plan:  59 y.o. female with a PMHx of congestive heart failure, acute on chronic hyponatremia, COPD, depression, diabetes mellitus type 2, hypertension, GERD, hyperlipidemia, vulvar cancer, schizoaffective disorder, history of psychogenic polydipsia who was admitted to [facility] on 01/24/2020 for evaluation of nausea, malaise,  and abdominal cramping.  1.  Hyponatremia, suspect some component of volume loop patient given elevated BUN and creatinine and admission.Marland Kitchen  However some component of underlying SIADH as there is a chronic element of hyponatremia. 2.  Hyperkalemia, improved potassium down to 5.1. 3.  Acute kidney injury with baseline creatinine of 0.7 and creatinine up to 1.4 at admission.  Plan: Renal function has partially improved.  Creatinine down to 1.2.  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  Continue IV fluid hydration for 1 additional day.  May need to consider tolvaptan also but hold off for now.  Follow serum sodium.  Hyperkalemia improved a serum potassium down to 5.1.  Continue sodium zirconium 10 g daily.  Further plan as patient progresses.   LOS: 4 Jevin Camino 8/6/202112:55 PM

## 2020-01-29 NOTE — Progress Notes (Signed)
PROGRESS NOTE  Natalie Thomas GUR:[id number] DOB: Jul 14, 1960 DOA: 01/24/2020 PCP: System, Pcp Not In  HPI/Recap of past 24 hours: HPI from Dr Ginnie Smart Spiresis a 59 y.o.femalewith medical history significant forvulvar cancer, COPD, chronic hypoxic respiratory failure, schizoaffective disorder, chronic hyponatremia, hypertension, and chronic diastolic CHF, now presenting to the emergency department for evaluation of several complaints including nausea, malaise, and abdominal discomfort. Patient has a difficult time providing history but indicates longstanding cramping discomfort in her abdomen that has recently worsened, nausea, and episode of nonbloody vomiting yesterday morning, and general malaise. She denies any fevers, worsening shortness of breath or cough, or chest pain. In the ED, patient is found to be afebrile, saturating well on her usual 4 L/min of supplemental oxygen, and with stable blood pressure. EKG features sinus rhythm with LVH. Chest x-ray notable for chronic cardiomegaly and vascular congestion with improved pulmonary edema from prior study. CT of the abdomen and pelvis demonstrates some increase in fat and bowel-containing umbilical hernia but without any obstruction or vascular compromise. Chemistry panel notable for sodium of 125, down from 130 in May 2021. CBC unremarkable. Troponin slightly elevated. Urinalysis with low specific gravity. Covid PCR is negative. Patient was given 150 cc of saline, fentanyl, and Ativan in the ED and hospitalist asked to admit for hyponatremia.    Today, patient continues to complain of chronic abdominal pain, back pain.  Denies any other new complaints.  Continues to ask for more pain medication.     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, some component of volume depletion Sodium improving today Nephrology consulted, continue on gentle hydration  Continue to hold Lasix, HCTZ due to rising creatinine Daily BMP  Chronic abdominal pain Lipase 25, LFTs WNL Afebrile, with no leukocytosis UA with trace amount of leukocytes, with low specific gravity CT abdomen/pelvis showed fat and bowel-containing umbilical hernia increased in size, without evidence of mechanical obstruction or vascular compromise General surgery consulted, no further recs, not a surgical candidate Pain management, monitor closely  Chronic diastolic HF Continue beta-blocker, nifedipine Held Lasix, lisinopril for now due to AKI  Hypertension Stable Continue meds as above  Chronic hypoxic respiratory failure History of COPD Continue supplemental oxygen  Schizoaffective disorder Continue Zyprexa, Depakote, Wellbutrin, Klonopin, Latuda  Obesity Lifestyle modification advised        Malnutrition Type:      Malnutrition Characteristics:      Nutrition Interventions:       Estimated body mass index is 36.39 kg/m as calculated from the following:   Height as of this encounter: 5\' 5"  (1.651 m).   Weight as of this encounter: 99.2 kg.     Code Status: DNR  Family Communication: None at bedside  Disposition Plan: Status is: Inpatient  Remains inpatient appropriate because:Inpatient level of care appropriate due to severity of illness   Dispo: The patient is from: SNF              Anticipated d/c is to: SNF  Anticipated d/c date is: 2 days              Patient currently is not medically stable to  d/c.     Consultants:  Psychiatry  General surgery  Nephrology   Procedures:  None  Antimicrobials:  None  DVT prophylaxis: Lovenox   Objective: Vitals:   01/28/20 1924 01/28/20 2114 01/29/20 0556 01/29/20 1445  BP: 103/72  127/71 127/73  Pulse: 80  87 87  Resp: 16  20 20   Temp: 99.3 F (37.4 C)  98.2 F (36.8 C) 98.6 F (37 C)  TempSrc: Oral  Oral Oral  SpO2: 97% 97% 100% 95%  Weight:      Height:        Intake/Output Summary (Last 24 hours) at 01/29/2020 1714 Last data filed at 01/29/2020 0600 Gross per 24 hour  Intake 1572.99 ml  Output 1300 ml  Net 272.99 ml   Filed Weights   01/25/20 0625 01/26/20 0500 01/27/20 0500  Weight: (!) 102 kg 99.9 kg 99.2 kg    Exam:  General: NAD   Cardiovascular: S1, S2 present  Respiratory: CTAB  Abdomen: Soft, nontender, obese, nondistended, bowel sounds present  Musculoskeletal: No bilateral pedal edema noted  Skin: Normal  Psychiatry:  Paranoia    Data Reviewed: CBC: Recent Labs  Lab 01/24/20 1849 01/25/20 0425 01/26/20 0411 01/27/20 0428  WBC 8.1 6.3 8.9 8.9  NEUTROABS 5.9 4.6 6.2 6.6  HGB 12.1 11.6* 11.3* 11.2*  HCT 35.5* 33.2* 34.6* 34.0*  MCV 83.9 84.1 87.2 85.6  PLT 195 168 198 967   Basic Metabolic Panel: Recent Labs  Lab 01/25/20 0425 01/26/20 0411 01/27/20 0428 01/28/20 0539 01/29/20 0420  NA 126* 124* 123* 119* 122*  K 3.8 4.3 4.8 5.5* 5.1  CL 87* 82* 83* 80* 86*  CO2 31 32 28 27 28   GLUCOSE 140* 154* 110* 116* 100*  BUN 18 20 35* 45* 46*  CREATININE 0.77 0.93 1.21* 1.40* 1.23*  CALCIUM 8.7* 8.9 9.0 8.9 8.3*   GFR: Estimated Creatinine Clearance: 58.2 mL/min (A) (by C-G formula based on SCr of 1.23 mg/dL (H)). Liver Function Tests: Recent Labs  Lab 01/24/20 1849  AST 17  ALT 10  ALKPHOS 60  BILITOT 0.6  PROT 7.9  ALBUMIN 3.8   Recent Labs  Lab 01/24/20 1849  LIPASE 25   No results for input(s): AMMONIA in the last 168 hours. Coagulation Profile: No  results for input(s): INR, PROTIME in the last 168 hours. Cardiac Enzymes: No results for input(s): CKTOTAL, CKMB, CKMBINDEX, TROPONINI in the last 168 hours. BNP (last 3 results) No results for input(s): PROBNP in the last 8760 hours. HbA1C: No results for input(s): HGBA1C in the last 72 hours. CBG: No results for input(s): GLUCAP in the last 168 hours. Lipid Profile: No results for input(s): CHOL, HDL, LDLCALC, TRIG, CHOLHDL, LDLDIRECT in the last 72 hours. Thyroid Function Tests: Recent Labs    01/28/20 0947  TSH 1.918   Anemia Panel: No results for input(s): VITAMINB12, FOLATE, FERRITIN, TIBC, IRON, RETICCTPCT in the last 72 hours. Urine analysis:    Component Value Date/Time   COLORURINE COLORLESS (A) 01/24/2020 1920   APPEARANCEUR CLEAR (A) 01/24/2020 1920   APPEARANCEUR Clear 06/22/2012 0618   LABSPEC 1.003 (L) 01/24/2020 1920   LABSPEC 1.003 06/22/2012 0618   PHURINE 8.0 01/24/2020 1920   GLUCOSEU NEGATIVE 01/24/2020 1920   GLUCOSEU Negative 06/22/2012 0618   GLUCOSEU NEGATIVE 04/09/2011 1058   HGBUR NEGATIVE 01/24/2020 1920  BILIRUBINUR NEGATIVE 01/24/2020 1920   BILIRUBINUR Negative 06/22/2012 0618   KETONESUR NEGATIVE 01/24/2020 1920   PROTEINUR NEGATIVE 01/24/2020 1920   UROBILINOGEN 1.0 08/18/2011 1805   NITRITE NEGATIVE 01/24/2020 1920   LEUKOCYTESUR TRACE (A) 01/24/2020 1920   LEUKOCYTESUR Trace 06/22/2012 0618   Sepsis Labs: @LABRCNTIP (procalcitonin:4,lacticidven:4)  ) Recent Results (from the past 240 hour(s))  SARS Coronavirus 2 by RT PCR (hospital order, performed in [facility] hospital lab) Nasopharyngeal Nasopharyngeal Swab     Status: None   Collection Time: 01/24/20  7:49 PM   Specimen: Nasopharyngeal Swab  Result Value Ref Range Status   SARS Coronavirus 2 NEGATIVE NEGATIVE Final    Comment: (NOTE) SARS-CoV-2 target nucleic acids are NOT DETECTED.  The SARS-CoV-2 RNA is generally detectable in upper and lower respiratory specimens  during the acute phase of infection. The lowest concentration of SARS-CoV-2 viral copies this assay can detect is 250 copies / mL. A negative result does not preclude SARS-CoV-2 infection and should not be used as the sole basis for treatment or other patient management decisions.  A negative result may occur with improper specimen collection / handling, submission of specimen other than nasopharyngeal swab, presence of viral mutation(s) within the areas targeted by this assay, and inadequate number of viral copies (<250 copies / mL). A negative result must be combined with clinical observations, patient history, and epidemiological information.  Fact Sheet for Patients:   StrictlyIdeas.no  Fact Sheet for Healthcare Providers: BankingDealers.co.za  This test is not yet approved or  cleared by the [place] FDA and has been authorized for detection and/or diagnosis of SARS-CoV-2 by FDA under an Emergency Use Authorization (EUA).  This EUA will remain in effect (meaning this test can be used) for the duration of the COVID-19 declaration under Section 564(b)(1) of the Act, 21 U.S.C. section 360bbb-3(b)(1), unless the authorization is terminated or revoked sooner.  Performed at [facility], [address] [place], [place] [address]   MRSA PCR Screening     Status: Abnormal   Collection Time: 01/25/20  1:37 AM   Specimen: Nasopharyngeal  Result Value Ref Range Status   MRSA by PCR POSITIVE (A) NEGATIVE Final    Comment:        The GeneXpert MRSA Assay (FDA approved for NASAL specimens only), is one component of a comprehensive MRSA colonization surveillance program. It is not intended to diagnose MRSA infection nor to guide or monitor treatment for MRSA infections. RESULT CALLED TO, READ BACK BY AND VERIFIED WITH: stacey clay at [place] on 01/25/20 rww Performed at [facility], [address]  [place], [place] [address]       Studies: No results found.  Scheduled Meds: . bisacodyl  10 mg Oral Daily  . buPROPion  150 mg Oral Daily  . Chlorhexidine Gluconate Cloth  6 each Topical Q0600  . clonazePAM  0.5 mg Oral BID  . divalproex  500 mg Oral BID  . docusate sodium  100 mg Oral BID  . enoxaparin (LOVENOX) injection  40 mg Subcutaneous Q24H  . fluticasone furoate-vilanterol  1 puff Inhalation Daily  . ipratropium-albuterol  3 mL Nebulization TID  . lurasidone  20 mg Oral BID  . metoprolol succinate  12.5 mg Oral Daily  . mupirocin ointment  1 application Nasal BID  . nicotine  21 mg Transdermal Daily  . NIFEdipine  60 mg Oral Daily  . OLANZapine  10 mg Oral BID  . pantoprazole  40 mg Oral Daily  .  polyethylene glycol  17 g Oral BID  . sodium chloride flush  3 mL Intravenous Q12H  . sodium zirconium cyclosilicate  10 g Oral Daily  . sucralfate  1 g Oral TID WC & HS    Continuous Infusions: . sodium chloride    . sodium chloride 75 mL/hr at 01/29/20 1057     LOS: 4 days     Alma Friendly, MD [facility]  If 7PM-7AM, please contact night-coverage [email or web address] 01/29/2020, 5:14 PM

## 2020-01-30 ENCOUNTER — Inpatient Hospital Stay: Payer: Medicare Other

## 2020-01-30 LAB — BASIC METABOLIC PANEL
Anion gap: 8 (ref 5–15)
BUN: 25 mg/dL — ABNORMAL HIGH (ref 6–20)
CO2: 27 mmol/L (ref 22–32)
Calcium: 8.2 mg/dL — ABNORMAL LOW (ref 8.9–10.3)
Chloride: 90 mmol/L — ABNORMAL LOW (ref 98–111)
Creatinine, Ser: 0.83 mg/dL (ref 0.44–1.00)
GFR calc Af Amer: >60 mL/min (ref >60)
GFR calc non Af Amer: >60 mL/min (ref >60)
Glucose, Bld: 117 mg/dL — ABNORMAL HIGH (ref 70–99)
Potassium: 4.3 mmol/L (ref 3.5–5.1)
Sodium: 125 mmol/L — ABNORMAL LOW (ref 135–145)

## 2020-01-30 MED ORDER — OXYCODONE HCL 5 MG PO TABS
10.0000 mg | ORAL_TABLET | ORAL | Status: DC | PRN
  Administered 2020-01-31 – 2020-02-02 (×7): 10 mg via ORAL
  Filled 2020-01-30 (×8): qty 2

## 2020-01-30 MED ORDER — HYDROMORPHONE HCL 1 MG/ML IJ SOLN: 1.0000 mg | Freq: Once | INTRAMUSCULAR | Status: DC

## 2020-01-30 MED ORDER — HYDROMORPHONE HCL 1 MG/ML IJ SOLN
0.5000 mg | Freq: Once | INTRAMUSCULAR | Status: AC
  Administered 2020-01-30: 0.5 mg via INTRAVENOUS
  Filled 2020-01-30: qty 0.5

## 2020-01-30 NOTE — Evaluation (Signed)
Physical Therapy Evaluation Patient Details Name: Natalie Thomas MRN: [id number] DOB: [birth year] Today's Date: 01/30/2020   History of Present Illness  Pt is a 59 y.o. female presenting to hospital 8/1 with abdominal pain, nausea, and malaise.  Pt admitted with hyponatremia and abdominal pain.  PMH includes schizoaffective disorder, anemia, anxiety, COPD, Covid-19, vulvar CA, R hip fx 2019, s/p R intertrochanteric IMN 10/2017, and h/o pelvic fx surgery.  Clinical Impression  Prior to hospital admission, pt reports using w/c (although pt inconsistent with assist levels, use of RW, and whether she is ambulatory or not) and per chart is a long term care resident at [name].  Currently pt is SBA semi-supine to sitting edge of bed; CGA with transfers; and CGA to ambulate a few feet bed to recliner with RW.  Overall pt did fairly well with functional mobility and LE ex's in bed.  Pt would benefit from skilled PT to address noted impairments and functional limitations (see below for any additional details).  Upon hospital discharge, pt would benefit from HHPT at facility.    Follow Up Recommendations Home health PT;Supervision for mobility/OOB    Equipment Recommendations   (pt reports having RW and w/c at facility)    Recommendations for Other Services       Precautions / Restrictions Precautions Precautions: Fall Restrictions Weight Bearing Restrictions: No      Mobility  Bed Mobility Overal bed mobility: Needs Assistance Bed Mobility: Supine to Sit     Supine to sit: Supervision;HOB elevated     General bed mobility comments: mild increased effort to perform on own  Transfers Overall transfer level: Needs assistance Equipment used: Rolling walker (2 wheeled) Transfers: Sit to/from [facility] Sit to Stand: Min guard         General transfer comment: fairly strong stand from bed noted (up to RW); controlled descent noted sitting in  recliner  Ambulation/Gait Ambulation/Gait assistance: Min guard Gait Distance (Feet): 3 Feet (bed to recliner) Assistive device: Rolling walker (2 wheeled)   Gait velocity: decreased   General Gait Details: decreased B LE step length/foot clearance/heelstrike; steady  Science writer    Modified Rankin (Stroke Patients Only)       Balance Overall balance assessment: Needs assistance Sitting-balance support: No upper extremity supported;Feet supported Sitting balance-Leahy Scale: Good Sitting balance - Comments: steady sitting reaching within BOS   Standing balance support: Single extremity supported Standing balance-Leahy Scale: Poor Standing balance comment: pt requiring at least single UE support for static standing balance                             Pertinent Vitals/Pain Pain Assessment: No/denies pain  Vitals (HR and O2 on 3 L via nasal cannula) stable and WFL throughout treatment session.    Home Living Family/patient expects to be discharged to:: Skilled nursing facility                 Additional Comments: Long term care resident of [facility]    Prior Function           Comments: Pt reporting transferring to w/c at baseline (although inconsistent with whether she used walker or not for transfer and how much assist required) and initially reporting she did not walk anymore but later in session was asking to walk; pt would not specifically answer if she was able  to propel her w/c on her own or if someone pushed her in the w/c     Hand Dominance        Extremity/Trunk Assessment   Upper Extremity Assessment Upper Extremity Assessment: Generalized weakness    Lower Extremity Assessment Lower Extremity Assessment: Generalized weakness    Cervical / Trunk Assessment Cervical / Trunk Assessment: Other exceptions (forward shoulders/head)  Communication   Communication: No difficulties   Cognition Arousal/Alertness: Awake/alert Behavior During Therapy:  (Inconsistent with information)                                   General Comments: Oriented to person, place, and situation.      General Comments   Nursing cleared pt for participation in physical therapy.  Pt agreeable to PT session.    Exercises Total Joint Exercises Ankle Circles/Pumps: AROM;Strengthening;Both;10 reps;Supine Short Arc Quad: AROM;Strengthening;Both;10 reps;Supine Heel Slides: AROM;Strengthening;Both;10 reps;Supine Hip ABduction/ADduction: AROM;Strengthening;Both;10 reps;Supine   Assessment/Plan    PT Assessment Patient needs continued PT services  PT Problem List Decreased strength;Decreased mobility;Decreased balance;Decreased activity tolerance       PT Treatment Interventions DME instruction;Gait training;Functional mobility training;Therapeutic activities;Therapeutic exercise;Balance training;Patient/family education    PT Goals (Current goals can be found in the Care Plan section)  Acute Rehab PT Goals Patient Stated Goal: to improve strength and mobility PT Goal Formulation: With patient Time For Goal Achievement: 02/13/20 Potential to Achieve Goals: Fair    Frequency Min 2X/week   Barriers to discharge        Co-evaluation               AM-PAC PT "6 Clicks" Mobility  Outcome Measure Help needed turning from your back to your side while in a flat bed without using bedrails?: None Help needed moving from lying on your back to sitting on the side of a flat bed without using bedrails?: None Help needed moving to and from a bed to a chair (including a wheelchair)?: A Little Help needed standing up from a chair using your arms (e.g., wheelchair or bedside chair)?: A Little Help needed to walk in hospital room?: A Little Help needed climbing 3-5 steps with a railing? : Total 6 Click Score: 18    End of Session Equipment Utilized During Treatment: Gait  belt Activity Tolerance: Patient tolerated treatment well Patient left: in chair;with call bell/phone within reach;with chair alarm set;Other (comment) (B heels floating via pillow) Nurse Communication: Mobility status;Precautions PT Visit Diagnosis: Muscle weakness (generalized) (M62.81);Other abnormalities of gait and mobility (R26.89)    Time: 1610-9604 PT Time Calculation (min) (ACUTE ONLY): 36 min   Charges:   PT Evaluation $PT Eval Low Complexity: 1 Low PT Treatments $Therapeutic Exercise: 8-22 mins $Therapeutic Activity: 8-22 mins       Leitha Bleak, PT 01/30/20, 4:11 PM

## 2020-01-30 NOTE — Progress Notes (Signed)
Patient complains of increased abdominal pain that is not relieved with oxycodone 5mg . NP Ouma made aware and new orders placed.

## 2020-01-30 NOTE — Progress Notes (Signed)
PROGRESS NOTE  Natalie Thomas QMG:[id number] DOB: Oct 24, 1960 DOA: 01/24/2020 PCP: System, Pcp Not In  HPI/Recap of past 24 hours: HPI from Dr Ginnie Smart Spiresis a 59 y.o.femalewith medical history significant forvulvar cancer, COPD, chronic hypoxic respiratory failure, schizoaffective disorder, chronic hyponatremia, hypertension, and chronic diastolic CHF, now presenting to the emergency department for evaluation of several complaints including nausea, malaise, and abdominal discomfort. Patient has a difficult time providing history but indicates longstanding cramping discomfort in her abdomen that has recently worsened, nausea, and episode of nonbloody vomiting yesterday morning, and general malaise. She denies any fevers, worsening shortness of breath or cough, or chest pain. In the ED, patient is found to be afebrile, saturating well on her usual 4 L/min of supplemental oxygen, and with stable blood pressure. EKG features sinus rhythm with LVH. Chest x-ray notable for chronic cardiomegaly and vascular congestion with improved pulmonary edema from prior study. CT of the abdomen and pelvis demonstrates some increase in fat and bowel-containing umbilical hernia but without any obstruction or vascular compromise. Chemistry panel notable for sodium of 125, down from 130 in May 2021. CBC unremarkable. Troponin slightly elevated. Urinalysis with low specific gravity. Covid PCR is negative. Patient was given 150 cc of saline, fentanyl, and Ativan in the ED and hospitalist asked to admit for hyponatremia.      Today, patient noted to be more sleepy, arousable, but drifts back to sleep.  Denies any new complaints.  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, some component of volume depletion Sodium improving today Nephrology consulted, continue on gentle hydration  Continue to hold Lasix, HCTZ due to rising creatinine Chest x-ray pending to monitor for volume overload Daily BMP  Chronic abdominal pain Lipase 25, LFTs WNL Afebrile, with no leukocytosis UA with trace amount of leukocytes, with low specific gravity CT abdomen/pelvis showed fat and bowel-containing umbilical hernia increased in size, without evidence of mechanical obstruction or vascular compromise General surgery consulted, no further recs, not a surgical candidate Pain management, monitor closely  Chronic diastolic HF Continue beta-blocker, nifedipine Held Lasix, lisinopril for now due to AKI  Hypertension Stable Continue meds as above  Chronic hypoxic respiratory failure History of COPD Continue supplemental oxygen  Schizoaffective disorder Continue Zyprexa, Depakote, Wellbutrin, Klonopin, Latuda  Obesity Lifestyle modification advised        Malnutrition Type:      Malnutrition Characteristics:      Nutrition Interventions:       Estimated body mass index is 36.39 kg/m as calculated from the following:   Height as of this encounter: 5\' 5"  (1.651 m).   Weight as of this encounter: 99.2 kg.     Code Status: DNR  Family Communication: None at bedside  Disposition Plan: Status is: Inpatient  Remains inpatient appropriate because:Inpatient level of care appropriate due to severity of illness   Dispo: The patient is from: SNF              Anticipated d/c is to: SNF  Anticipated d/c date is: 2 days              Patient  currently is not medically stable to d/c.     Consultants:  Psychiatry  General surgery  Nephrology   Procedures:  None  Antimicrobials:  None  DVT prophylaxis: Lovenox   Objective: Vitals:   01/30/20 0521 01/30/20 0810 01/30/20 1415 01/30/20 1430  BP: (!) 142/71  127/73   Pulse: 91 67 79 88  Resp: 18 18 20 18   Temp: 99 F (37.2 C)  99 F (37.2 C)   TempSrc: Oral  Oral   SpO2: 93% 98% 98% 96%  Weight:      Height:        Intake/Output Summary (Last 24 hours) at 01/30/2020 1534 Last data filed at 01/30/2020 5726 Gross per 24 hour  Intake 1838.6 ml  Output 2800 ml  Net -961.4 ml   Filed Weights   01/25/20 0625 01/26/20 0500 01/27/20 0500  Weight: (!) 102 kg 99.9 kg 99.2 kg    Exam:  General: NAD, drowsy   Cardiovascular: S1, S2 present  Respiratory: CTAB  Abdomen: Soft, nontender, obese, nondistended, bowel sounds present  Musculoskeletal: No bilateral pedal edema noted  Skin: Normal  Psychiatry:  Paranoia    Data Reviewed: CBC: Recent Labs  Lab 01/24/20 1849 01/25/20 0425 01/26/20 0411 01/27/20 0428  WBC 8.1 6.3 8.9 8.9  NEUTROABS 5.9 4.6 6.2 6.6  HGB 12.1 11.6* 11.3* 11.2*  HCT 35.5* 33.2* 34.6* 34.0*  MCV 83.9 84.1 87.2 85.6  PLT 195 168 198 203   Basic Metabolic Panel: Recent Labs  Lab 01/26/20 0411 01/27/20 0428 01/28/20 0539 01/29/20 0420 01/30/20 0433  NA 124* 123* 119* 122* 125*  K 4.3 4.8 5.5* 5.1 4.3  CL 82* 83* 80* 86* 90*  CO2 32 28 27 28 27   GLUCOSE 154* 110* 116* 100* 117*  BUN 20 35* 45* 46* 25*  CREATININE 0.93 1.21* 1.40* 1.23* 0.83  CALCIUM 8.9 9.0 8.9 8.3* 8.2*   GFR: Estimated Creatinine Clearance: 86.2 mL/min (by C-G formula based on SCr of 0.83 mg/dL). Liver Function Tests: Recent Labs  Lab 01/24/20 1849  AST 17  ALT 10  ALKPHOS 60  BILITOT 0.6  PROT 7.9  ALBUMIN 3.8   Recent Labs  Lab 01/24/20 1849  LIPASE 25   No results for input(s): AMMONIA in the last 168 hours. Coagulation  Profile: No results for input(s): INR, PROTIME in the last 168 hours. Cardiac Enzymes: No results for input(s): CKTOTAL, CKMB, CKMBINDEX, TROPONINI in the last 168 hours. BNP (last 3 results) No results for input(s): PROBNP in the last 8760 hours. HbA1C: No results for input(s): HGBA1C in the last 72 hours. CBG: No results for input(s): GLUCAP in the last 168 hours. Lipid Profile: No results for input(s): CHOL, HDL, LDLCALC, TRIG, CHOLHDL, LDLDIRECT in the last 72 hours. Thyroid Function Tests: Recent Labs    01/28/20 0947  TSH 1.918   Anemia Panel: No results for input(s): VITAMINB12, FOLATE, FERRITIN, TIBC, IRON, RETICCTPCT in the last 72 hours. Urine analysis:    Component Value Date/Time   COLORURINE COLORLESS (A) 01/24/2020 1920   APPEARANCEUR CLEAR (A) 01/24/2020 1920   APPEARANCEUR Clear 06/22/2012 0618   LABSPEC 1.003 (L) 01/24/2020 1920   LABSPEC 1.003 06/22/2012 0618   PHURINE 8.0 01/24/2020 1920   GLUCOSEU NEGATIVE 01/24/2020 1920   GLUCOSEU Negative 06/22/2012 0618   GLUCOSEU NEGATIVE 04/09/2011 1058   HGBUR NEGATIVE 01/24/2020 1920   BILIRUBINUR  NEGATIVE 01/24/2020 1920   BILIRUBINUR Negative 06/22/2012 0618   KETONESUR NEGATIVE 01/24/2020 1920   PROTEINUR NEGATIVE 01/24/2020 1920   UROBILINOGEN 1.0 08/18/2011 1805   NITRITE NEGATIVE 01/24/2020 1920   LEUKOCYTESUR TRACE (A) 01/24/2020 1920   LEUKOCYTESUR Trace 06/22/2012 0618   Sepsis Labs: @LABRCNTIP (procalcitonin:4,lacticidven:4)  ) Recent Results (from the past 240 hour(s))  SARS Coronavirus 2 by RT PCR (hospital order, performed in [place] hospital lab) Nasopharyngeal Nasopharyngeal Swab     Status: None   Collection Time: 01/24/20  7:49 PM   Specimen: Nasopharyngeal Swab  Result Value Ref Range Status   SARS Coronavirus 2 NEGATIVE NEGATIVE Final    Comment: (NOTE) SARS-CoV-2 target nucleic acids are NOT DETECTED.  The SARS-CoV-2 RNA is generally detectable in upper and lower respiratory  specimens during the acute phase of infection. The lowest concentration of SARS-CoV-2 viral copies this assay can detect is 250 copies / mL. A negative result does not preclude SARS-CoV-2 infection and should not be used as the sole basis for treatment or other patient management decisions.  A negative result may occur with improper specimen collection / handling, submission of specimen other than nasopharyngeal swab, presence of viral mutation(s) within the areas targeted by this assay, and inadequate number of viral copies (<250 copies / mL). A negative result must be combined with clinical observations, patient history, and epidemiological information.  Fact Sheet for Patients:   StrictlyIdeas.no  Fact Sheet for Healthcare Providers: BankingDealers.co.za  This test is not yet approved or  cleared by the [place] FDA and has been authorized for detection and/or diagnosis of SARS-CoV-2 by FDA under an Emergency Use Authorization (EUA).  This EUA will remain in effect (meaning this test can be used) for the duration of the COVID-19 declaration under Section 564(b)(1) of the Act, 21 U.S.C. section 360bbb-3(b)(1), unless the authorization is terminated or revoked sooner.  Performed at [facility], [address] [place], [place] [address]   MRSA PCR Screening     Status: Abnormal   Collection Time: 01/25/20  1:37 AM   Specimen: Nasopharyngeal  Result Value Ref Range Status   MRSA by PCR POSITIVE (A) NEGATIVE Final    Comment:        The GeneXpert MRSA Assay (FDA approved for NASAL specimens only), is one component of a comprehensive MRSA colonization surveillance program. It is not intended to diagnose MRSA infection nor to guide or monitor treatment for MRSA infections. RESULT CALLED TO, READ BACK BY AND VERIFIED WITH: stacey clay at [facility] on 01/25/20 rww Performed at [facility], [address]  [place], [place] [address]       Studies: No results found.  Scheduled Meds: . bisacodyl  10 mg Oral Daily  . buPROPion  150 mg Oral Daily  . clonazePAM  0.5 mg Oral BID  . divalproex  500 mg Oral BID  . docusate sodium  100 mg Oral BID  . enoxaparin (LOVENOX) injection  40 mg Subcutaneous Q24H  . fluticasone furoate-vilanterol  1 puff Inhalation Daily  . ipratropium-albuterol  3 mL Nebulization TID  . lurasidone  20 mg Oral BID  . metoprolol succinate  12.5 mg Oral Daily  . nicotine  21 mg Transdermal Daily  . NIFEdipine  60 mg Oral Daily  . OLANZapine  10 mg Oral BID  . pantoprazole  40 mg Oral Daily  . polyethylene glycol  17 g Oral BID  . sodium chloride flush  3 mL Intravenous Q12H  .  sodium zirconium cyclosilicate  10 g Oral Daily  . sucralfate  1 g Oral TID WC & HS    Continuous Infusions: . sodium chloride    . sodium chloride 75 mL/hr at 01/30/20 0455     LOS: 5 days     Alma Friendly, MD [facility]  If 7PM-7AM, please contact night-coverage [email or web address] 01/30/2020, 3:34 PM

## 2020-01-30 NOTE — Evaluation (Signed)
Occupational Therapy Evaluation Patient Details Name: Natalie Thomas MRN: [id number] DOB: [birth year] Today's Date: 01/30/2020    History of Present Illness Pt is a 59 y.o. female presenting to hospital 8/1 with abdominal pain, nausea, and malaise.  Pt admitted with hyponatremia and abdominal pain.  PMH includes schizoaffective disorder, anemia, anxiety, COPD, Covid-19, vulvar CA, R hip fx 2019, s/p R intertrochanteric IMN 10/2017, and h/o pelvic fx surgery.   Clinical Impression   Natalie Thomas was seen for OT evaluation this date. Prior to hospital admission, pt was receiving care in a long-term care facility. She reports that she needs assistance at baseline, but does not provide direct information regarding PLOF. Per chart review, pt requires assistance with BADL management including bathing and dressing from facility staff, and uses a RW for at least brief transfers within her facility. Pt presents at or near baseline level of functional independence. She is able to reach B feet to adjust hospital socks and performs SPT from room recliner back to bed with supervision for safety. She is generally resistant to OT education regarding safety with AE and functional transfers and states, "All I need OT for is to get my arms strong again". BUE strength generally WFL for tasks assessed this date. All education/assessment completed at time of OT evaluation. No further skilled OT needs identified. Will sign off at this time. Please re-consult if additional OT needs arise. At this time to not anticipate f/u OT needs upon hospital admission. Recommend pt return to her LTC facility with 24/7 assist/supervision.      Follow Up Recommendations  Supervision/Assistance - 24 hour;No OT follow up    Equipment Recommendations       Recommendations for Other Services       Precautions / Restrictions Precautions Precautions: Fall Restrictions Weight Bearing Restrictions: No      Mobility Bed  Mobility Overal bed mobility: Needs Assistance Bed Mobility: Sit to Supine     Supine to sit: Supervision;HOB elevated Sit to supine: Supervision;HOB elevated   General bed mobility comments: mild increased effort to perform on own  Transfers Overall transfer level: Needs assistance Equipment used: Rolling walker (2 wheeled) Transfers: Sit to/from [facility] Sit to Stand: Supervision Stand pivot transfers: Supervision       General transfer comment: Stands from recliner w/o physical assist. Safe use of RW noted. Pt able to take small steps to EOB w/o physical assistance or overt LOB appreciated with assessment.    Balance Overall balance assessment: Needs assistance Sitting-balance support: No upper extremity supported;Feet supported Sitting balance-Leahy Scale: Good Sitting balance - Comments: steady sitting reaching within BOS   Standing balance support: During functional activity;Bilateral upper extremity supported;Single extremity supported Standing balance-Leahy Scale: Fair Standing balance comment: BUE support required with functional transfer. Pt able to briefly stand at EOB unassisted, reaches for bed rail when sitting onto EOB.                           ADL either performed or assessed with clinical judgement   ADL Overall ADL's : At baseline                                       General ADL Comments: Pt presents at or near baseline level of functional independence. She endorses needing assistance with bathing and dressing at baseline. On this date she  is able to reach B feet to adjust socks, and performs functional transfer with supervision for safety. Pt resident of LTC facility at baseline. Has assist needed.     Vision   Additional Comments: Pt appears to visually track appropriately t/o session. Denies visual complaints.     Perception     Praxis      Pertinent Vitals/Pain Pain Assessment: Faces Faces Pain  Scale: Hurts even more Pain Location: Pt endorses pain in her "coccyx", but later in session states "I hurt all over!" Pain Descriptors / Indicators: Aching;Grimacing;Guarding Pain Intervention(s): Limited activity within patient's tolerance;Monitored during session;Repositioned     Hand Dominance Right   Extremity/Trunk Assessment Upper Extremity Assessment Upper Extremity Assessment: Overall WFL for tasks assessed   Lower Extremity Assessment Lower Extremity Assessment: Generalized weakness   Cervical / Trunk Assessment Cervical / Trunk Assessment: Other exceptions (forward shoulders/head)   Communication Communication Communication: No difficulties   Cognition Arousal/Alertness: Awake/alert Behavior During Therapy: Agitated;Anxious Overall Cognitive Status: No family/caregiver present to determine baseline cognitive functioning                                 General Comments: Oriented to person, place, situation. Very directive of care and consistently questions therapist actions (e.g. IV unplugged to aid in safe transfer, pt demanding to know why it was unplugged, etc.). Provides conflicting information, and generally avoids direct answers to questions regarding PLOF.   General Comments  Vitals monitored t/o session. HR noted to remain 85-95 with pt at rest/during activity. spO2 remains WFL 92% with pt on 3L [place] t/o session.    Exercises Total Joint Exercises Ankle Circles/Pumps: AROM;Strengthening;Both;10 reps;Supine Short Arc Quad: AROM;Strengthening;Both;10 reps;Supine Heel Slides: AROM;Strengthening;Both;10 reps;Supine Hip ABduction/ADduction: AROM;Strengthening;Both;10 reps;Supine Other Exercises Other Exercises: Pt educated on role of OT in acute setting, falls prevention strategies, safe use of AE for functional mobility, and safe transfer technique this date. Education limited, pt resistant to education regarding OT services. States she was getting OT at  her facility.   Shoulder Instructions      Home Living Family/patient expects to be discharged to:: Skilled nursing facility                                 Additional Comments: Long term care resident of [facility]      Prior Functioning/Environment Level of Independence: Needs assistance    ADL's / Homemaking Assistance Needed: Pt reports needing assist with her care from rehab staff. States "I do the best I can. Per chart, requires assist with BADL management at baseline.   Comments: Pt provides conflicting information regarding PLOF. Per chart, was using a RW for transfers/possible functional mobility, however pt states "I don't walk anymore" when asked about assistance needed for mobility. May be using a WC at facility for longer distances.        OT Problem List: Decreased strength;Decreased coordination;Decreased activity tolerance;Decreased safety awareness;Impaired balance (sitting and/or standing)      OT Treatment/Interventions:      OT Goals(Current goals can be found in the care plan section) Acute Rehab OT Goals Patient Stated Goal: To have less pain OT Goal Formulation: All assessment and education complete, DC therapy Time For Goal Achievement: 01/30/20 Potential to Achieve Goals: Good  OT Frequency:     Barriers to D/C:  Co-evaluation              AM-PAC OT "6 Clicks" Daily Activity     Outcome Measure Help from another person eating meals?: None Help from another person taking care of personal grooming?: None Help from another person toileting, which includes using toliet, bedpan, or urinal?: A Little Help from another person bathing (including washing, rinsing, drying)?: A Little Help from another person to put on and taking off regular upper body clothing?: A Little Help from another person to put on and taking off regular lower body clothing?: A Little 6 Click Score: 20   End of Session Equipment Utilized  During Treatment: Gait belt;Rolling walker Nurse Communication: Mobility status;Other (comment) (Pt back to bed with clean external catheter placed.)  Activity Tolerance: Patient tolerated treatment well Patient left: in bed;with call bell/phone within reach;with bed alarm set  OT Visit Diagnosis: Other abnormalities of gait and mobility (R26.89);Muscle weakness (generalized) (M62.81)                Time: 1610-9604 OT Time Calculation (min): 25 min Charges:  OT General Charges $OT Visit: 1 Visit OT Evaluation $OT Eval Moderate Complexity: 1 Mod OT Treatments $Self Care/Home Management : 8-22 mins  Shara Blazing, M.S., OTR/L Ascom: [phone] 01/30/20, 3:37 PM

## 2020-01-30 NOTE — Progress Notes (Signed)
[facility]  ROUNDING NOTE   Subjective:   UOP 3310 Na 125 (122) (119) NS at 64mL/hr  Creatinine 0.83 (1.23)  Patient was difficult to arouse this morning. She states she did not sleep last night.    Objective:  Vital signs in last 24 hours:  Temp:  [98.6 F (37 C)-99.9 F (37.7 C)] 99 F (37.2 C) (08/07 1415) Pulse Rate:  [67-94] 88 (08/07 1430) Resp:  [18-20] 18 (08/07 1430) BP: (107-142)/(71-80) 127/73 (08/07 1415) SpO2:  [83 %-98 %] 96 % (08/07 1430)  Weight change:  Filed Weights   01/25/20 0625 01/26/20 0500 01/27/20 0500  Weight: (!) 102 kg 99.9 kg 99.2 kg    Intake/Output: I/O last 3 completed shifts: In: 2732.8 [P.O.:360; I.V.:2372.8] Out: 6967 [Urine:4610]   Intake/Output this shift:  No intake/output data recorded.  Physical Exam: General:  No acute distress  Head:  Normocephalic, atraumatic. Moist oral mucosal membranes  Eyes:  Anicteric  Neck:  Supple  Lungs:   Clear to auscultation, normal effort  Heart:  regular  Abdomen:   Soft, nontender, bowel sounds present  Extremities:  Trace peripheral edema.  Neurologic:  Awake, alert, following commands  Skin:  No lesions       Basic Metabolic Panel: Recent Labs  Lab 01/26/20 0411 01/26/20 0411 01/27/20 0428 01/27/20 0428 01/28/20 0539 01/29/20 0420 01/30/20 0433  NA 124*  --  123*  --  119* 122* 125*  K 4.3  --  4.8  --  5.5* 5.1 4.3  CL 82*  --  83*  --  80* 86* 90*  CO2 32  --  28  --  27 28 27   GLUCOSE 154*  --  110*  --  116* 100* 117*  BUN 20  --  35*  --  45* 46* 25*  CREATININE 0.93  --  1.21*  --  1.40* 1.23* 0.83  CALCIUM 8.9   < > 9.0   < > 8.9 8.3* 8.2*   < > = values in this interval not displayed.    Liver Function Tests: Recent Labs  Lab 01/24/20 1849  AST 17  ALT 10  ALKPHOS 60  BILITOT 0.6  PROT 7.9  ALBUMIN 3.8   Recent Labs  Lab 01/24/20 1849  LIPASE 25   No results for input(s): AMMONIA in the last 168 hours.  CBC: Recent Labs  Lab  01/24/20 1849 01/25/20 0425 01/26/20 0411 01/27/20 0428  WBC 8.1 6.3 8.9 8.9  NEUTROABS 5.9 4.6 6.2 6.6  HGB 12.1 11.6* 11.3* 11.2*  HCT 35.5* 33.2* 34.6* 34.0*  MCV 83.9 84.1 87.2 85.6  PLT 195 168 198 185    Cardiac Enzymes: No results for input(s): CKTOTAL, CKMB, CKMBINDEX, TROPONINI in the last 168 hours.  BNP: Invalid input(s): POCBNP  CBG: No results for input(s): GLUCAP in the last 168 hours.  Microbiology: Results for orders placed or performed during the hospital encounter of 01/24/20  SARS Coronavirus 2 by RT PCR (hospital order, performed in [facility] hospital lab) Nasopharyngeal Nasopharyngeal Swab     Status: None   Collection Time: 01/24/20  7:49 PM   Specimen: Nasopharyngeal Swab  Result Value Ref Range Status   SARS Coronavirus 2 NEGATIVE NEGATIVE Final    Comment: (NOTE) SARS-CoV-2 target nucleic acids are NOT DETECTED.  The SARS-CoV-2 RNA is generally detectable in upper and lower respiratory specimens during the acute phase of infection. The lowest concentration of SARS-CoV-2 viral copies this assay can detect is 250  copies / mL. A negative result does not preclude SARS-CoV-2 infection and should not be used as the sole basis for treatment or other patient management decisions.  A negative result may occur with improper specimen collection / handling, submission of specimen other than nasopharyngeal swab, presence of viral mutation(s) within the areas targeted by this assay, and inadequate number of viral copies (<250 copies / mL). A negative result must be combined with clinical observations, patient history, and epidemiological information.  Fact Sheet for Patients:   StrictlyIdeas.no  Fact Sheet for Healthcare Providers: BankingDealers.co.za  This test is not yet approved or  cleared by the [place] FDA and has been authorized for detection and/or diagnosis of SARS-CoV-2 by FDA under an  Emergency Use Authorization (EUA).  This EUA will remain in effect (meaning this test can be used) for the duration of the COVID-19 declaration under Section 564(b)(1) of the Act, 21 U.S.C. section 360bbb-3(b)(1), unless the authorization is terminated or revoked sooner.  Performed at [facility], [address] [place], [name] [address]   MRSA PCR Screening     Status: Abnormal   Collection Time: 01/25/20  1:37 AM   Specimen: Nasopharyngeal  Result Value Ref Range Status   MRSA by PCR POSITIVE (A) NEGATIVE Final    Comment:        The GeneXpert MRSA Assay (FDA approved for NASAL specimens only), is one component of a comprehensive MRSA colonization surveillance program. It is not intended to diagnose MRSA infection nor to guide or monitor treatment for MRSA infections. RESULT CALLED TO, READ BACK BY AND VERIFIED WITH: stacey clay at [place] on 01/25/20 rww Performed at [facility], [address] [place], [place] [address]     Coagulation Studies: No results for input(s): LABPROT, INR in the last 72 hours.  Urinalysis: No results for input(s): COLORURINE, LABSPEC, PHURINE, GLUCOSEU, HGBUR, BILIRUBINUR, KETONESUR, PROTEINUR, UROBILINOGEN, NITRITE, LEUKOCYTESUR in the last 72 hours.  Invalid input(s): APPERANCEUR    Imaging: No results found.   Medications:   . sodium chloride    . sodium chloride 75 mL/hr at 01/30/20 0455   . bisacodyl  10 mg Oral Daily  . buPROPion  150 mg Oral Daily  . clonazePAM  0.5 mg Oral BID  . divalproex  500 mg Oral BID  . docusate sodium  100 mg Oral BID  . enoxaparin (LOVENOX) injection  40 mg Subcutaneous Q24H  . fluticasone furoate-vilanterol  1 puff Inhalation Daily  . ipratropium-albuterol  3 mL Nebulization TID  . lurasidone  20 mg Oral BID  . metoprolol succinate  12.5 mg Oral Daily  . nicotine  21 mg Transdermal Daily  . NIFEdipine  60 mg Oral Daily  . OLANZapine  10 mg Oral BID  . pantoprazole  40 mg  Oral Daily  . polyethylene glycol  17 g Oral BID  . sodium chloride flush  3 mL Intravenous Q12H  . sodium zirconium cyclosilicate  10 g Oral Daily  . sucralfate  1 g Oral TID WC & HS   sodium chloride, acetaminophen **OR** acetaminophen, albuterol, dicyclomine, guaiFENesin, hydrOXYzine, neomycin-bacitracin-polymyxin, ondansetron **OR** ondansetron (ZOFRAN) IV, oxyCODONE, polyvinyl alcohol, traZODone  Assessment/ Plan:   Ms. Natalie Thomas is a 59 y.o. white female with congestive heart failure, chronic hyponatremia, COPD, depression, diabetes mellitus type 2, hypertension, GERD, hyperlipidemia, vulvar cancer, schizoaffective disorder, history of psychogenic polydipsia who was admitted to [facility] on 01/24/2020 for Hyponatremia [E87.1] Abdominal pain, unspecified abdominal location [R10.9]  1.  Hyponatremia: acute on chronic. Edema on examination. Suspect hypovolemic hyponatremia, thiazide diuretics and reset osmostat.  - Continue IV normal saline  2. Acute kidney injury with hyperkalemia: improving with IV fluids consistent with prerenal azotemia.  Baseline creatinine of 0.95, normal GFR on 10/25/19. - Currently on lokelma for hyperkalemia  - holding chlorthalidone, furosemide and lisinopril.  3. Hypertension: blood pressure is at goal, 127/73.  - nifedipine and metoprolol   LOS: 5 Artis Buechele 8/7/20212:33 PM

## 2020-01-31 LAB — BASIC METABOLIC PANEL
Anion gap: 9 (ref 5–15)
BUN: 17 mg/dL (ref 6–20)
CO2: 28 mmol/L (ref 22–32)
Calcium: 8.6 mg/dL — ABNORMAL LOW (ref 8.9–10.3)
Chloride: 89 mmol/L — ABNORMAL LOW (ref 98–111)
Creatinine, Ser: 0.57 mg/dL (ref 0.44–1.00)
GFR calc Af Amer: >60 mL/min (ref >60)
GFR calc non Af Amer: >60 mL/min (ref >60)
Glucose, Bld: 99 mg/dL (ref 70–99)
Potassium: 4.8 mmol/L (ref 3.5–5.1)
Sodium: 126 mmol/L — ABNORMAL LOW (ref 135–145)

## 2020-01-31 LAB — SODIUM: Sodium: 124 mmol/L — ABNORMAL LOW (ref 135–145)

## 2020-01-31 MED ORDER — FUROSEMIDE 20 MG PO TABS
20.0000 mg | ORAL_TABLET | Freq: Two times a day (BID) | ORAL | Status: DC
  Administered 2020-01-31 – 2020-02-02 (×4): 20 mg via ORAL
  Filled 2020-01-31 (×4): qty 1

## 2020-01-31 MED ORDER — ALBUTEROL SULFATE (2.5 MG/3ML) 0.083% IN NEBU
2.5000 mg | INHALATION_SOLUTION | RESPIRATORY_TRACT | Status: DC | PRN
  Administered 2020-02-01: 2.5 mg via RESPIRATORY_TRACT
  Filled 2020-01-31: qty 3

## 2020-01-31 NOTE — Progress Notes (Signed)
PROGRESS NOTE  Natalie Thomas OBS:[id number] DOB: [birth year] DOA: 01/24/2020 PCP: System, Pcp Not In  HPI/Recap of past 24 hours: HPI from Dr Ginnie Smart Spiresis a 59 y.o.femalewith medical history significant forvulvar cancer, COPD, chronic hypoxic respiratory failure, schizoaffective disorder, chronic hyponatremia, hypertension, and chronic diastolic CHF, now presenting to the emergency department for evaluation of several complaints including nausea, malaise, and abdominal discomfort. Patient has a difficult time providing history but indicates longstanding cramping discomfort in her abdomen that has recently worsened, nausea, and episode of nonbloody vomiting yesterday morning, and general malaise. She denies any fevers, worsening shortness of breath or cough, or chest pain. In the ED, patient is found to be afebrile, saturating well on her usual 4 L/min of supplemental oxygen, and with stable blood pressure. EKG features sinus rhythm with LVH. Chest x-ray notable for chronic cardiomegaly and vascular congestion with improved pulmonary edema from prior study. CT of the abdomen and pelvis demonstrates some increase in fat and bowel-containing umbilical hernia but without any obstruction or vascular compromise. Chemistry panel notable for sodium of 125, down from 130 in May 2021. CBC unremarkable. Troponin slightly elevated. Urinalysis with low specific gravity. Covid PCR is negative. Patient was given 150 cc of saline, fentanyl, and Ativan in the ED and hospitalist asked to admit for hyponatremia.      Today, patient continues to complain of abdominal pain, requesting for more pain meds.  Denies any other new complaints.     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, some component of volume depletion Sodium improving today Nephrology consulted, DC IV fluids Restart Lasix, hold HCTZ Repeat chest x-ray showed unchanged bilateral interstitial pulmonary edema, no focal airspace opacity Daily BMP  Chronic abdominal pain Lipase 25, LFTs WNL Afebrile, with no leukocytosis UA with trace amount of leukocytes, with low specific gravity CT abdomen/pelvis showed fat and bowel-containing umbilical hernia increased in size, without evidence of mechanical obstruction or vascular compromise General surgery consulted, no further recs, not a surgical candidate Pain management, monitor closely  Chronic diastolic HF Continue beta-blocker, nifedipine Restarted Lasix, hold lisinopril   Hypertension Stable Continue meds as above  Chronic hypoxic respiratory failure History of COPD Continue supplemental oxygen  Schizoaffective disorder Continue Zyprexa, Depakote, Wellbutrin, Klonopin, Latuda  Obesity Lifestyle modification advised        Malnutrition Type:      Malnutrition Characteristics:      Nutrition Interventions:       Estimated body mass index is 36.39 kg/m as calculated from the following:   Height as of this encounter: 5\' 5"  (1.651 m).   Weight as of this encounter: 99.2 kg.     Code Status: DNR  Family Communication: None at bedside  Disposition Plan: Status is: Inpatient  Remains inpatient appropriate because:Inpatient level of care appropriate due to severity of illness   Dispo: The patient is from: SNF              Anticipated d/c is to: SNF              Anticipated  d/c date is: 2 days              Patient currently is not medically stable to  d/c.     Consultants:  Psychiatry  General surgery  Nephrology   Procedures:  None  Antimicrobials:  None  DVT prophylaxis: Lovenox   Objective: Vitals:   01/30/20 2001 01/30/20 2124 01/31/20 0457 01/31/20 1427  BP: 133/77  117/73 (!) 183/62  Pulse: 82  92 72  Resp: 20  20 16   Temp: 98.2 F (36.8 C)  98.8 F (37.1 C) 97.7 F (36.5 C)  TempSrc: Oral  Oral Oral  SpO2: 98% 98% 96% 97%  Weight:      Height:        Intake/Output Summary (Last 24 hours) at 01/31/2020 1432 Last data filed at 01/30/2020 2249 Gross per 24 hour  Intake 3 ml  Output 1801 ml  Net -1798 ml   Filed Weights   01/25/20 0625 01/26/20 0500 01/27/20 0500  Weight: (!) 102 kg 99.9 kg 99.2 kg    Exam:  General: NAD  Cardiovascular: S1, S2 present  Respiratory: CTAB  Abdomen: Soft, nontender, obese, nondistended, bowel sounds present  Musculoskeletal: No bilateral pedal edema noted  Skin: Normal  Psychiatry:  Paranoia    Data Reviewed: CBC: Recent Labs  Lab 01/24/20 1849 01/25/20 0425 01/26/20 0411 01/27/20 0428  WBC 8.1 6.3 8.9 8.9  NEUTROABS 5.9 4.6 6.2 6.6  HGB 12.1 11.6* 11.3* 11.2*  HCT 35.5* 33.2* 34.6* 34.0*  MCV 83.9 84.1 87.2 85.6  PLT 195 168 198 342   Basic Metabolic Panel: Recent Labs  Lab 01/27/20 0428 01/28/20 0539 01/29/20 0420 01/30/20 0433 01/31/20 0438  NA 123* 119* 122* 125* 126*  K 4.8 5.5* 5.1 4.3 4.8  CL 83* 80* 86* 90* 89*  CO2 28 27 28 27 28   GLUCOSE 110* 116* 100* 117* 99  BUN 35* 45* 46* 25* 17  CREATININE 1.21* 1.40* 1.23* 0.83 0.57  CALCIUM 9.0 8.9 8.3* 8.2* 8.6*   GFR: Estimated Creatinine Clearance: 89.4 mL/min (by C-G formula based on SCr of 0.57 mg/dL). Liver Function Tests: Recent Labs  Lab 01/24/20 1849  AST 17  ALT 10  ALKPHOS 60  BILITOT 0.6  PROT 7.9  ALBUMIN 3.8   Recent Labs  Lab 01/24/20 1849  LIPASE 25   No results for input(s): AMMONIA in the last 168 hours. Coagulation Profile: No results for  input(s): INR, PROTIME in the last 168 hours. Cardiac Enzymes: No results for input(s): CKTOTAL, CKMB, CKMBINDEX, TROPONINI in the last 168 hours. BNP (last 3 results) No results for input(s): PROBNP in the last 8760 hours. HbA1C: No results for input(s): HGBA1C in the last 72 hours. CBG: No results for input(s): GLUCAP in the last 168 hours. Lipid Profile: No results for input(s): CHOL, HDL, LDLCALC, TRIG, CHOLHDL, LDLDIRECT in the last 72 hours. Thyroid Function Tests: No results for input(s): TSH, T4TOTAL, FREET4, T3FREE, THYROIDAB in the last 72 hours. Anemia Panel: No results for input(s): VITAMINB12, FOLATE, FERRITIN, TIBC, IRON, RETICCTPCT in the last 72 hours. Urine analysis:    Component Value Date/Time   COLORURINE COLORLESS (A) 01/24/2020 1920   APPEARANCEUR CLEAR (A) 01/24/2020 1920   APPEARANCEUR Clear 06/22/2012 0618   LABSPEC 1.003 (L) 01/24/2020 1920   LABSPEC 1.003 06/22/2012 0618   PHURINE 8.0 01/24/2020 1920   GLUCOSEU NEGATIVE 01/24/2020 1920   GLUCOSEU Negative 06/22/2012 0618   GLUCOSEU NEGATIVE 04/09/2011 1058   HGBUR NEGATIVE 01/24/2020 1920  BILIRUBINUR NEGATIVE 01/24/2020 1920   BILIRUBINUR Negative 06/22/2012 0618   KETONESUR NEGATIVE 01/24/2020 1920   PROTEINUR NEGATIVE 01/24/2020 1920   UROBILINOGEN 1.0 08/18/2011 1805   NITRITE NEGATIVE 01/24/2020 1920   LEUKOCYTESUR TRACE (A) 01/24/2020 1920   LEUKOCYTESUR Trace 06/22/2012 0618   Sepsis Labs: @LABRCNTIP (procalcitonin:4,lacticidven:4)  ) Recent Results (from the past 240 hour(s))  SARS Coronavirus 2 by RT PCR (hospital order, performed in [place] hospital lab) Nasopharyngeal Nasopharyngeal Swab     Status: None   Collection Time: 01/24/20  7:49 PM   Specimen: Nasopharyngeal Swab  Result Value Ref Range Status   SARS Coronavirus 2 NEGATIVE NEGATIVE Final    Comment: (NOTE) SARS-CoV-2 target nucleic acids are NOT DETECTED.  The SARS-CoV-2 RNA is generally detectable in upper and  lower respiratory specimens during the acute phase of infection. The lowest concentration of SARS-CoV-2 viral copies this assay can detect is 250 copies / mL. A negative result does not preclude SARS-CoV-2 infection and should not be used as the sole basis for treatment or other patient management decisions.  A negative result may occur with improper specimen collection / handling, submission of specimen other than nasopharyngeal swab, presence of viral mutation(s) within the areas targeted by this assay, and inadequate number of viral copies (<250 copies / mL). A negative result must be combined with clinical observations, patient history, and epidemiological information.  Fact Sheet for Patients:   StrictlyIdeas.no  Fact Sheet for Healthcare Providers: BankingDealers.co.za  This test is not yet approved or  cleared by the [place] FDA and has been authorized for detection and/or diagnosis of SARS-CoV-2 by FDA under an Emergency Use Authorization (EUA).  This EUA will remain in effect (meaning this test can be used) for the duration of the COVID-19 declaration under Section 564(b)(1) of the Act, 21 U.S.C. section 360bbb-3(b)(1), unless the authorization is terminated or revoked sooner.  Performed at [facility], [address] [place], [place] [address]   MRSA PCR Screening     Status: Abnormal   Collection Time: 01/25/20  1:37 AM   Specimen: Nasopharyngeal  Result Value Ref Range Status   MRSA by PCR POSITIVE (A) NEGATIVE Final    Comment:        The GeneXpert MRSA Assay (FDA approved for NASAL specimens only), is one component of a comprehensive MRSA colonization surveillance program. It is not intended to diagnose MRSA infection nor to guide or monitor treatment for MRSA infections. RESULT CALLED TO, READ BACK BY AND VERIFIED WITH: stacey clay at [place] on 01/25/20 rww Performed at [facility], [address] [place], [place] [address]       Studies: [facility] Chest [facility] 1 View  Result Date: 01/30/2020 CLINICAL DATA:  Abdominal pain, dyspnea EXAM: PORTABLE CHEST 1 VIEW COMPARISON:  01/24/2020 FINDINGS: Cardiomegaly. Unchanged diffuse bilateral interstitial pulmonary opacity. The visualized skeletal structures are unremarkable. IMPRESSION: 1. Unchanged diffuse bilateral interstitial pulmonary opacity, most consistent with edema. No focal airspace opacity. 2. Cardiomegaly. Electronically Signed   By: Eddie Candle M.D.   On: 01/30/2020 16:56    Scheduled Meds: . bisacodyl  10 mg Oral Daily  . buPROPion  150 mg Oral Daily  . clonazePAM  0.5 mg Oral BID  . divalproex  500 mg Oral BID  . docusate sodium  100 mg Oral BID  . enoxaparin (LOVENOX) injection  40 mg Subcutaneous Q24H  . fluticasone furoate-vilanterol  1 puff Inhalation Daily  . furosemide  20 mg Oral BID  .  ipratropium-albuterol  3 mL Nebulization TID  . lurasidone  20 mg Oral BID  . metoprolol succinate  12.5 mg Oral Daily  . nicotine  21 mg Transdermal Daily  . NIFEdipine  60 mg Oral Daily  . OLANZapine  10 mg Oral BID  . pantoprazole  40 mg Oral Daily  . polyethylene glycol  17 g Oral BID  . sodium chloride flush  3 mL Intravenous Q12H  . sodium zirconium cyclosilicate  10 g Oral Daily  . sucralfate  1 g Oral TID WC & HS    Continuous Infusions: . sodium chloride       LOS: 6 days     Alma Friendly, MD [facility]  If 7PM-7AM, please contact night-coverage [email or web address] 01/31/2020, 2:32 PM

## 2020-01-31 NOTE — Progress Notes (Signed)
[facility]  ROUNDING NOTE   Subjective:   Na 126 (125)  Patient has many complaints and questions.    Objective:  Vital signs in last 24 hours:  Temp:  [98.2 F (36.8 C)-99 F (37.2 C)] 98.8 F (37.1 C) (08/08 0457) Pulse Rate:  [79-92] 92 (08/08 0457) Resp:  [18-20] 20 (08/08 0457) BP: (117-133)/(73-77) 117/73 (08/08 0457) SpO2:  [96 %-98 %] 96 % (08/08 0457)  Weight change:  Filed Weights   01/25/20 0625 01/26/20 0500 01/27/20 0500  Weight: (!) 102 kg 99.9 kg 99.2 kg    Intake/Output: I/O last 3 completed shifts: In: 1841.6 [P.O.:120; I.V.:1721.6] Out: 3601 [Urine:3600; Stool:1]   Intake/Output this shift:  No intake/output data recorded.  Physical Exam: General:  No acute distress  Head:  Normocephalic, atraumatic. Moist oral mucosal membranes  Eyes:  Anicteric  Neck:  Supple  Lungs:   Clear to auscultation, normal effort  Heart:  regular  Abdomen:   +tender periumbilical. +ventral hernia.   Extremities:  Trace peripheral edema.  Neurologic:  Awake, alert, following commands  Skin:  No lesions       Basic Metabolic Panel: Recent Labs  Lab 01/27/20 0428 01/27/20 0428 01/28/20 0539 01/28/20 0539 01/29/20 0420 01/30/20 0433 01/31/20 0438  NA 123*  --  119*  --  122* 125* 126*  K 4.8  --  5.5*  --  5.1 4.3 4.8  CL 83*  --  80*  --  86* 90* 89*  CO2 28  --  27  --  28 27 28   GLUCOSE 110*  --  116*  --  100* 117* 99  BUN 35*  --  45*  --  46* 25* 17  CREATININE 1.21*  --  1.40*  --  1.23* 0.83 0.57  CALCIUM 9.0   < > 8.9   < > 8.3* 8.2* 8.6*   < > = values in this interval not displayed.    Liver Function Tests: Recent Labs  Lab 01/24/20 1849  AST 17  ALT 10  ALKPHOS 60  BILITOT 0.6  PROT 7.9  ALBUMIN 3.8   Recent Labs  Lab 01/24/20 1849  LIPASE 25   No results for input(s): AMMONIA in the last 168 hours.  CBC: Recent Labs  Lab 01/24/20 1849 01/25/20 0425 01/26/20 0411 01/27/20 0428  WBC 8.1 6.3 8.9 8.9   NEUTROABS 5.9 4.6 6.2 6.6  HGB 12.1 11.6* 11.3* 11.2*  HCT 35.5* 33.2* 34.6* 34.0*  MCV 83.9 84.1 87.2 85.6  PLT 195 168 198 185    Cardiac Enzymes: No results for input(s): CKTOTAL, CKMB, CKMBINDEX, TROPONINI in the last 168 hours.  BNP: Invalid input(s): POCBNP  CBG: No results for input(s): GLUCAP in the last 168 hours.  Microbiology: Results for orders placed or performed during the hospital encounter of 01/24/20  SARS Coronavirus 2 by RT PCR (hospital order, performed in [facility] hospital lab) Nasopharyngeal Nasopharyngeal Swab     Status: None   Collection Time: 01/24/20  7:49 PM   Specimen: Nasopharyngeal Swab  Result Value Ref Range Status   SARS Coronavirus 2 NEGATIVE NEGATIVE Final    Comment: (NOTE) SARS-CoV-2 target nucleic acids are NOT DETECTED.  The SARS-CoV-2 RNA is generally detectable in upper and lower respiratory specimens during the acute phase of infection. The lowest concentration of SARS-CoV-2 viral copies this assay can detect is 250 copies / mL. A negative result does not preclude SARS-CoV-2 infection and should not be used as the  sole basis for treatment or other patient management decisions.  A negative result may occur with improper specimen collection / handling, submission of specimen other than nasopharyngeal swab, presence of viral mutation(s) within the areas targeted by this assay, and inadequate number of viral copies (<250 copies / mL). A negative result must be combined with clinical observations, patient history, and epidemiological information.  Fact Sheet for Patients:   StrictlyIdeas.no  Fact Sheet for Healthcare Providers: BankingDealers.co.za  This test is not yet approved or  cleared by the [place] FDA and has been authorized for detection and/or diagnosis of SARS-CoV-2 by FDA under an Emergency Use Authorization (EUA).  This EUA will remain in effect (meaning this test  can be used) for the duration of the COVID-19 declaration under Section 564(b)(1) of the Act, 21 U.S.C. section 360bbb-3(b)(1), unless the authorization is terminated or revoked sooner.  Performed at [facility], [address] [place], [place] [address]   MRSA PCR Screening     Status: Abnormal   Collection Time: 01/25/20  1:37 AM   Specimen: Nasopharyngeal  Result Value Ref Range Status   MRSA by PCR POSITIVE (A) NEGATIVE Final    Comment:        The GeneXpert MRSA Assay (FDA approved for NASAL specimens only), is one component of a comprehensive MRSA colonization surveillance program. It is not intended to diagnose MRSA infection nor to guide or monitor treatment for MRSA infections. RESULT CALLED TO, READ BACK BY AND VERIFIED WITH: stacey clay at [place] on 01/25/20 rww Performed at [facility], [address] [place], [place] [address]     Coagulation Studies: No results for input(s): LABPROT, INR in the last 72 hours.  Urinalysis: No results for input(s): COLORURINE, LABSPEC, PHURINE, GLUCOSEU, HGBUR, BILIRUBINUR, KETONESUR, PROTEINUR, UROBILINOGEN, NITRITE, LEUKOCYTESUR in the last 72 hours.  Invalid input(s): APPERANCEUR    Imaging: DG Chest Port 1 View  Result Date: 01/30/2020 CLINICAL DATA:  Abdominal pain, dyspnea EXAM: PORTABLE CHEST 1 VIEW COMPARISON:  01/24/2020 FINDINGS: Cardiomegaly. Unchanged diffuse bilateral interstitial pulmonary opacity. The visualized skeletal structures are unremarkable. IMPRESSION: 1. Unchanged diffuse bilateral interstitial pulmonary opacity, most consistent with edema. No focal airspace opacity. 2. Cardiomegaly. Electronically Signed   By: Eddie Candle M.D.   On: 01/30/2020 16:56     Medications:   . sodium chloride     . bisacodyl  10 mg Oral Daily  . buPROPion  150 mg Oral Daily  . clonazePAM  0.5 mg Oral BID  . divalproex  500 mg Oral BID  . docusate sodium  100 mg Oral BID  . enoxaparin (LOVENOX)  injection  40 mg Subcutaneous Q24H  . fluticasone furoate-vilanterol  1 puff Inhalation Daily  . furosemide  20 mg Oral BID  . ipratropium-albuterol  3 mL Nebulization TID  . lurasidone  20 mg Oral BID  . metoprolol succinate  12.5 mg Oral Daily  . nicotine  21 mg Transdermal Daily  . NIFEdipine  60 mg Oral Daily  . OLANZapine  10 mg Oral BID  . pantoprazole  40 mg Oral Daily  . polyethylene glycol  17 g Oral BID  . sodium chloride flush  3 mL Intravenous Q12H  . sodium zirconium cyclosilicate  10 g Oral Daily  . sucralfate  1 g Oral TID WC & HS   sodium chloride, acetaminophen **OR** acetaminophen, albuterol, dicyclomine, guaiFENesin, hydrOXYzine, neomycin-bacitracin-polymyxin, ondansetron **OR** ondansetron (ZOFRAN) IV, oxyCODONE, polyvinyl alcohol, traZODone  Assessment/ Plan:   Ms. Dalena  D Rekowski is a 59 y.o. white female with congestive heart failure, chronic hyponatremia, COPD, depression, diabetes mellitus type 2, hypertension, GERD, hyperlipidemia, vulvar cancer, schizoaffective disorder, history of psychogenic polydipsia who was admitted to [facility] on 01/24/2020 for Hyponatremia [E87.1] Abdominal pain, unspecified abdominal location [R10.9]  1.  Hyponatremia: acute on chronic. Edema on examination. Suspect hypovolemic hyponatremia, thiazide diuretics and reset osmostat.  - Discontinue normal saline - restart furosemide - fluid restriction at 1516mL.   2. Acute kidney injury with hyperkalemia: secondary to prerenal azotemia.  Creatinine back to baseline.  - Currently on lokelma for hyperkalemia  - holding chlorthalidone and lisinopril. - Restarted furosemide as above  3. Hypertension: blood pressure is at goal 117/73 - nifedipine and metoprolol   LOS: 6 Natalie Thomas 8/8/202112:35 PM

## 2020-01-31 NOTE — Progress Notes (Signed)
Patient educated about fluid restriction diet 1200 mls however patient is not compliant. Pt frequently requesting drinks from staff.

## 2020-02-01 LAB — BASIC METABOLIC PANEL
Anion gap: 9 (ref 5–15)
BUN: 15 mg/dL (ref 6–20)
CO2: 32 mmol/L (ref 22–32)
Calcium: 8.7 mg/dL — ABNORMAL LOW (ref 8.9–10.3)
Chloride: 88 mmol/L — ABNORMAL LOW (ref 98–111)
Creatinine, Ser: 0.67 mg/dL (ref 0.44–1.00)
GFR calc Af Amer: >60 mL/min (ref >60)
GFR calc non Af Amer: >60 mL/min (ref >60)
Glucose, Bld: 96 mg/dL (ref 70–99)
Potassium: 4.3 mmol/L (ref 3.5–5.1)
Sodium: 129 mmol/L — ABNORMAL LOW (ref 135–145)

## 2020-02-01 MED ORDER — HYDRALAZINE HCL 20 MG/ML IJ SOLN: 10.0000 mg | Freq: Three times a day (TID) | INTRAMUSCULAR | Status: DC | PRN

## 2020-02-01 MED ORDER — ENSURE MAX PROTEIN PO LIQD
11.0000 [oz_av] | Freq: Every day | ORAL | Status: DC
  Administered 2020-02-02: 11 [oz_av] via ORAL
  Filled 2020-02-01: qty 330

## 2020-02-01 NOTE — Progress Notes (Signed)
PROGRESS NOTE  Natalie HARBIN OXB:[id number] DOB: June 09, 1961 DOA: 01/24/2020 PCP: System, Pcp Not In  HPI/Recap of past 24 hours: HPI from Dr Ginnie Smart Spiresis a 59 y.o.femalewith medical history significant forvulvar cancer, COPD, chronic hypoxic respiratory failure, schizoaffective disorder, chronic hyponatremia, hypertension, and chronic diastolic CHF, now presenting to the emergency department for evaluation of several complaints including nausea, malaise, and abdominal discomfort. Patient has a difficult time providing history but indicates longstanding cramping discomfort in her abdomen that has recently worsened, nausea, and episode of nonbloody vomiting yesterday morning, and general malaise. She denies any fevers, worsening shortness of breath or cough, or chest pain. In the ED, patient is found to be afebrile, saturating well on her usual 4 L/min of supplemental oxygen, and with stable blood pressure. EKG features sinus rhythm with LVH. Chest x-ray notable for chronic cardiomegaly and vascular congestion with improved pulmonary edema from prior study. CT of the abdomen and pelvis demonstrates some increase in fat and bowel-containing umbilical hernia but without any obstruction or vascular compromise. Chemistry panel notable for sodium of 125, down from 130 in May 2021. CBC unremarkable. Troponin slightly elevated. Urinalysis with low specific gravity. Covid PCR is negative. Patient was given 150 cc of saline, fentanyl, and Ativan in the ED and hospitalist asked to admit for hyponatremia.      Today, patient continues to have multiple complaints including abdominal pain, the need to have a bowel movement, more medication for anxiety abdominal pain.     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, some component of volume depletion Sodium improving today Nephrology consulted, DC IV fluids Restart Lasix, hold HCTZ Repeat chest x-ray showed unchanged bilateral interstitial pulmonary edema, no focal airspace opacity Daily BMP  Chronic abdominal pain Lipase 25, LFTs WNL Afebrile, with no leukocytosis UA with trace amount of leukocytes, with low specific gravity CT abdomen/pelvis showed fat and bowel-containing umbilical hernia increased in size, without evidence of mechanical obstruction or vascular compromise General surgery consulted, no further recs, not a surgical candidate Pain management, monitor closely  Chronic diastolic HF Continue beta-blocker, nifedipine Restarted Lasix, hold lisinopril   Hypertension Stable Continue meds as above  Chronic hypoxic respiratory failure History of COPD Continue supplemental oxygen  Schizoaffective disorder Continue Zyprexa, Depakote, Wellbutrin, Klonopin, Latuda  Obesity Lifestyle modification advised        Malnutrition Type:  Nutrition Problem: Inadequate oral intake Etiology: acute illness   Malnutrition Characteristics:  Signs/Symptoms: meal completion < 50%   Nutrition Interventions:  Interventions: Ensure Enlive (each supplement provides 350kcal and 20 grams of protein), Liberalize Diet, MVI    Estimated body mass index is 37.42 kg/m as calculated from the following:   Height as of this encounter: 5\' 5"  (1.651 m).   Weight as of this encounter: 102 kg.     Code Status: DNR  Family Communication: None at bedside  Disposition Plan: Status is: Inpatient  Remains inpatient appropriate  because:Inpatient level of care appropriate due to severity of illness   Dispo: The patient is from: SNF  Anticipated d/c is to: SNF              Anticipated d/c date is: 1 day              Patient currently is not medically stable to d/c.     Consultants:  Psychiatry  General surgery  Nephrology   Procedures:  None  Antimicrobials:  None  DVT prophylaxis: Lovenox   Objective: Vitals:   02/01/20 1017 02/01/20 1138 02/01/20 1140 02/01/20 1143  BP: 133/74 (!) 197/64 (!) 185/65 135/75  Pulse: 75 77 77 75  Resp: 16 18    Temp: 98.1 F (36.7 C) 98.1 F (36.7 C)    TempSrc: Oral Oral    SpO2: 97% 97%    Weight:      Height:        Intake/Output Summary (Last 24 hours) at 02/01/2020 1452 Last data filed at 02/01/2020 1336 Gross per 24 hour  Intake 600 ml  Output 2800 ml  Net -2200 ml   Filed Weights   01/26/20 0500 01/27/20 0500 02/01/20 0500  Weight: 99.9 kg 99.2 kg 102 kg    Exam:  General: NAD  Cardiovascular: S1, S2 present  Respiratory: CTAB  Abdomen: Soft, nontender, obese, nondistended, bowel sounds present  Musculoskeletal: No bilateral pedal edema noted  Skin: Normal  Psychiatry:  Paranoia, anxious    Data Reviewed: CBC: Recent Labs  Lab 01/26/20 0411 01/27/20 0428  WBC 8.9 8.9  NEUTROABS 6.2 6.6  HGB 11.3* 11.2*  HCT 34.6* 34.0*  MCV 87.2 85.6  PLT 198 818   Basic Metabolic Panel: Recent Labs  Lab 01/28/20 0539 01/28/20 0539 01/29/20 0420 01/30/20 0433 01/31/20 0438 01/31/20 1812 02/01/20 0545  NA 119*   < > 122* 125* 126* 124* 129*  K 5.5*  --  5.1 4.3 4.8  --  4.3  CL 80*  --  86* 90* 89*  --  88*  CO2 27  --  28 27 28   --  32  GLUCOSE 116*  --  100* 117* 99  --  96  BUN 45*  --  46* 25* 17  --  15  CREATININE 1.40*  --  1.23* 0.83 0.57  --  0.67  CALCIUM 8.9  --  8.3* 8.2* 8.6*  --  8.7*   < > = values in this interval not displayed.   GFR: Estimated Creatinine Clearance: 90.8 mL/min (by C-G  formula based on SCr of 0.67 mg/dL). Liver Function Tests: No results for input(s): AST, ALT, ALKPHOS, BILITOT, PROT, ALBUMIN in the last 168 hours. No results for input(s): LIPASE, AMYLASE in the last 168 hours. No results for input(s): AMMONIA in the last 168 hours. Coagulation Profile: No results for input(s): INR, PROTIME in the last 168 hours. Cardiac Enzymes: No results for input(s): CKTOTAL, CKMB, CKMBINDEX, TROPONINI in the last 168 hours. BNP (last 3 results) No results for input(s): PROBNP in the last 8760 hours. HbA1C: No results for input(s): HGBA1C in the last 72 hours. CBG: No results for input(s): GLUCAP in the last 168 hours. Lipid Profile: No results for input(s): CHOL, HDL, LDLCALC, TRIG, CHOLHDL, LDLDIRECT in the last 72 hours. Thyroid Function Tests: No results for input(s): TSH, T4TOTAL, FREET4, T3FREE, THYROIDAB in the last 72 hours. Anemia Panel: No results for input(s): VITAMINB12, FOLATE, FERRITIN, TIBC, IRON, RETICCTPCT in the last 72 hours. Urine analysis:    Component Value Date/Time   COLORURINE COLORLESS (A) 01/24/2020 1920   APPEARANCEUR CLEAR (A) 01/24/2020  1920   APPEARANCEUR Clear 06/22/2012 0618   LABSPEC 1.003 (L) 01/24/2020 1920   LABSPEC 1.003 06/22/2012 0618   PHURINE 8.0 01/24/2020 1920   GLUCOSEU NEGATIVE 01/24/2020 1920   GLUCOSEU Negative 06/22/2012 0618   GLUCOSEU NEGATIVE 04/09/2011 1058   HGBUR NEGATIVE 01/24/2020 1920   BILIRUBINUR NEGATIVE 01/24/2020 1920   BILIRUBINUR Negative 06/22/2012 0618   KETONESUR NEGATIVE 01/24/2020 1920   PROTEINUR NEGATIVE 01/24/2020 1920   UROBILINOGEN 1.0 08/18/2011 1805   NITRITE NEGATIVE 01/24/2020 1920   LEUKOCYTESUR TRACE (A) 01/24/2020 1920   LEUKOCYTESUR Trace 06/22/2012 0618   Sepsis Labs: @LABRCNTIP (procalcitonin:4,lacticidven:4)  ) Recent Results (from the past 240 hour(s))  SARS Coronavirus 2 by RT PCR (hospital order, performed in [place] hospital lab) Nasopharyngeal  Nasopharyngeal Swab     Status: None   Collection Time: 01/24/20  7:49 PM   Specimen: Nasopharyngeal Swab  Result Value Ref Range Status   SARS Coronavirus 2 NEGATIVE NEGATIVE Final    Comment: (NOTE) SARS-CoV-2 target nucleic acids are NOT DETECTED.  The SARS-CoV-2 RNA is generally detectable in upper and lower respiratory specimens during the acute phase of infection. The lowest concentration of SARS-CoV-2 viral copies this assay can detect is 250 copies / mL. A negative result does not preclude SARS-CoV-2 infection and should not be used as the sole basis for treatment or other patient management decisions.  A negative result may occur with improper specimen collection / handling, submission of specimen other than nasopharyngeal swab, presence of viral mutation(s) within the areas targeted by this assay, and inadequate number of viral copies (<250 copies / mL). A negative result must be combined with clinical observations, patient history, and epidemiological information.  Fact Sheet for Patients:   StrictlyIdeas.no  Fact Sheet for Healthcare Providers: BankingDealers.co.za  This test is not yet approved or  cleared by the [place] FDA and has been authorized for detection and/or diagnosis of SARS-CoV-2 by FDA under an Emergency Use Authorization (EUA).  This EUA will remain in effect (meaning this test can be used) for the duration of the COVID-19 declaration under Section 564(b)(1) of the Act, 21 U.S.C. section 360bbb-3(b)(1), unless the authorization is terminated or revoked sooner.  Performed at [facility], [address] [place], [place] [address]   MRSA PCR Screening     Status: Abnormal   Collection Time: 01/25/20  1:37 AM   Specimen: Nasopharyngeal  Result Value Ref Range Status   MRSA by PCR POSITIVE (A) NEGATIVE Final    Comment:        The GeneXpert MRSA Assay (FDA approved for NASAL  specimens only), is one component of a comprehensive MRSA colonization surveillance program. It is not intended to diagnose MRSA infection nor to guide or monitor treatment for MRSA infections. RESULT CALLED TO, READ BACK BY AND VERIFIED WITH: stacey clay at [employer] on 01/25/20 rww Performed at [facility], [address] [place], [place] [address]       Studies: No results found.  Scheduled Meds: . bisacodyl  10 mg Oral Daily  . buPROPion  150 mg Oral Daily  . clonazePAM  0.5 mg Oral BID  . divalproex  500 mg Oral BID  . docusate sodium  100 mg Oral BID  . enoxaparin (LOVENOX) injection  40 mg Subcutaneous Q24H  . fluticasone furoate-vilanterol  1 puff Inhalation Daily  . furosemide  20 mg Oral BID  . lurasidone  20 mg Oral BID  . metoprolol succinate  12.5 mg Oral  Daily  . nicotine  21 mg Transdermal Daily  . NIFEdipine  60 mg Oral Daily  . OLANZapine  10 mg Oral BID  . pantoprazole  40 mg Oral Daily  . polyethylene glycol  17 g Oral BID  . Ensure Max Protein  11 oz Oral Daily  . sodium chloride flush  3 mL Intravenous Q12H  . sucralfate  1 g Oral TID WC & HS    Continuous Infusions: . sodium chloride       LOS: 7 days     Alma Friendly, MD [facility]  If 7PM-7AM, please contact night-coverage [email or web address] 02/01/2020, 2:52 PM

## 2020-02-01 NOTE — Progress Notes (Signed)
Initial Nutrition Assessment  DOCUMENTATION CODES:   Obesity unspecified  INTERVENTION:   Ensure Max protein supplement po daily, each supplement provides 150kcal and 30g of protein.  Liberalize diet   NUTRITION DIAGNOSIS:   Inadequate oral intake related to acute illness as evidenced by meal completion < 50%.  GOAL:   Patient will meet greater than or equal to 90% of their needs  MONITOR:   PO intake, Supplement acceptance, Labs, Weight trends, Skin, I & O's  REASON FOR ASSESSMENT:   Malnutrition Screening Tool    ASSESSMENT:   59 y.o. female with a PMHx of ventral hernia, congestive heart failure, acute on chronic hyponatremia, COPD, depression, diabetes mellitus type 2, hypertension, GERD, hyperlipidemia, vulvar cancer, schizoaffective disorder, history of psychogenic polydipsia who was admitted to [facility] on 01/24/2020 for evaluation of nausea, malaise, and abdominal cramping.   Met with pt in room today. Pt is well known to this RD from multiple previous admits. Pt is a poor historian at baseline. Pt very difficult to keep on topic to answer questions; pt mainly focused on getting her diet liberalized today. Pt is generally a good eater at baseline. Pt does report today that food sometimes feels like it gets stuck in her chest; pt also reports that if she drinks too much liquid, it will come back up and out her nose. Pt had eaten 50% of her lunch at time of RD visit today and reports that she was going to try and eat the rest. RD will add daily supplements to help pt meet her estimated needs. RD will liberalize pt's diet to a low sodium diet only as the heart healthy diet is restrictive of protein. Per chart, pt is weight stable at baseline.   Medications reviewed and include: dulcolax, colace, lovenox, lasix, protonix, miralax, carafate  Labs reviewed: Na 129(L)  NUTRITION - FOCUSED PHYSICAL EXAM:    Most Recent Value  Orbital Region No depletion  Upper Arm Region No  depletion  Thoracic and Lumbar Region No depletion  Buccal Region No depletion  Temple Region No depletion  Clavicle Bone Region No depletion  Clavicle and Acromion Bone Region No depletion  Scapular Bone Region No depletion  Dorsal Hand No depletion  Patellar Region No depletion  Anterior Thigh Region No depletion  Posterior Calf Region No depletion  Edema (RD Assessment) Mild  Hair Reviewed  Eyes Reviewed  Mouth Reviewed  Skin Reviewed  Nails Reviewed     Diet Order:   Diet Order            Diet 2 gram sodium Room service appropriate? Yes; Fluid consistency: Thin; Fluid restriction: 1500 mL Fluid  Diet effective now                EDUCATION NEEDS:   Education needs have been addressed  Skin:  Skin Assessment: Reviewed RN Assessment (ecchymosis)  Last BM:  8/8- type 2  Height:   Ht Readings from Last 1 Encounters:  01/24/20 _0  (1.651 m)    Weight:   Wt Readings from Last 1 Encounters:  02/01/20 102 kg    Ideal Body Weight:  56.8 kg  BMI:  Body mass index is 37.42 kg/m.  Estimated Nutritional Needs:   Kcal:  1900-2200kcal/day  Protein:  95-105g/day  Fluid:  1.7-2L/day  Natalie Distance MS, RD, LDN Please refer to [facility] for RD and/or RD on-call/weekend/after hours pager

## 2020-02-01 NOTE — Progress Notes (Signed)
[facility]  ROUNDING NOTE   Subjective:   Na 129 (126) (125)  Restart on furosemide yesterday.   Objective:  Vital signs in last 24 hours:  Temp:  [97.7 F (36.5 C)-98.7 F (37.1 C)] 98.1 F (36.7 C) (08/09 1138) Pulse Rate:  [72-91] 75 (08/09 1143) Resp:  [16-18] 18 (08/09 1138) BP: (105-210)/(50-75) 135/75 (08/09 1143) SpO2:  [94 %-97 %] 97 % (08/09 1138) Weight:  [102 kg] 102 kg (08/09 0500)  Weight change:  Filed Weights   01/26/20 0500 01/27/20 0500 02/01/20 0500  Weight: 99.9 kg 99.2 kg 102 kg    Intake/Output: I/O last 3 completed shifts: In: 363 [P.O.:360; I.V.:3] Out: 2701 [Urine:2700; Stool:1]   Intake/Output this shift:  Total I/O In: -  Out: 1100 [Urine:1100]  Physical Exam: General:  No acute distress  Head:  Normocephalic, atraumatic. Moist oral mucosal membranes  Eyes:  Anicteric  Neck:  Supple  Lungs:   Clear to auscultation, normal effort  Heart:  regular  Abdomen:   +tender periumbilical. +ventral hernia.   Extremities:  no peripheral edema.  Neurologic:  Awake, alert, following commands  Skin:  No lesions       Basic Metabolic Panel: Recent Labs  Lab 01/28/20 0539 01/28/20 0539 01/29/20 0420 01/29/20 0420 01/30/20 0433 01/31/20 0438 01/31/20 1812 02/01/20 0545  NA 119*   < > 122*  --  125* 126* 124* 129*  K 5.5*  --  5.1  --  4.3 4.8  --  4.3  CL 80*  --  86*  --  90* 89*  --  88*  CO2 27  --  28  --  27 28  --  32  GLUCOSE 116*  --  100*  --  117* 99  --  96  BUN 45*  --  46*  --  25* 17  --  15  CREATININE 1.40*  --  1.23*  --  0.83 0.57  --  0.67  CALCIUM 8.9   < > 8.3*   < > 8.2* 8.6*  --  8.7*   < > = values in this interval not displayed.    Liver Function Tests: No results for input(s): AST, ALT, ALKPHOS, BILITOT, PROT, ALBUMIN in the last 168 hours. No results for input(s): LIPASE, AMYLASE in the last 168 hours. No results for input(s): AMMONIA in the last 168 hours.  CBC: Recent Labs  Lab  01/26/20 0411 01/27/20 0428  WBC 8.9 8.9  NEUTROABS 6.2 6.6  HGB 11.3* 11.2*  HCT 34.6* 34.0*  MCV 87.2 85.6  PLT 198 185    Cardiac Enzymes: No results for input(s): CKTOTAL, CKMB, CKMBINDEX, TROPONINI in the last 168 hours.  BNP: Invalid input(s): POCBNP  CBG: No results for input(s): GLUCAP in the last 168 hours.  Microbiology: Results for orders placed or performed during the hospital encounter of 01/24/20  SARS Coronavirus 2 by RT PCR (hospital order, performed in [facility] hospital lab) Nasopharyngeal Nasopharyngeal Swab     Status: None   Collection Time: 01/24/20  7:49 PM   Specimen: Nasopharyngeal Swab  Result Value Ref Range Status   SARS Coronavirus 2 NEGATIVE NEGATIVE Final    Comment: (NOTE) SARS-CoV-2 target nucleic acids are NOT DETECTED.  The SARS-CoV-2 RNA is generally detectable in upper and lower respiratory specimens during the acute phase of infection. The lowest concentration of SARS-CoV-2 viral copies this assay can detect is 250 copies / mL. A negative result does not preclude SARS-CoV-2 infection  and should not be used as the sole basis for treatment or other patient management decisions.  A negative result may occur with improper specimen collection / handling, submission of specimen other than nasopharyngeal swab, presence of viral mutation(s) within the areas targeted by this assay, and inadequate number of viral copies (<250 copies / mL). A negative result must be combined with clinical observations, patient history, and epidemiological information.  Fact Sheet for Patients:   StrictlyIdeas.no  Fact Sheet for Healthcare Providers: BankingDealers.co.za  This test is not yet approved or  cleared by the [place] FDA and has been authorized for detection and/or diagnosis of SARS-CoV-2 by FDA under an Emergency Use Authorization (EUA).  This EUA will remain in effect (meaning this test can  be used) for the duration of the COVID-19 declaration under Section 564(b)(1) of the Act, 21 U.S.C. section 360bbb-3(b)(1), unless the authorization is terminated or revoked sooner.  Performed at [facility], [address] [place], [place] [address]   MRSA PCR Screening     Status: Abnormal   Collection Time: 01/25/20  1:37 AM   Specimen: Nasopharyngeal  Result Value Ref Range Status   MRSA by PCR POSITIVE (A) NEGATIVE Final    Comment:        The GeneXpert MRSA Assay (FDA approved for NASAL specimens only), is one component of a comprehensive MRSA colonization surveillance program. It is not intended to diagnose MRSA infection nor to guide or monitor treatment for MRSA infections. RESULT CALLED TO, READ BACK BY AND VERIFIED WITH: stacey clay at [place] on 01/25/20 rww Performed at [facility], [address] [place], [place] [address]     Coagulation Studies: No results for input(s): LABPROT, INR in the last 72 hours.  Urinalysis: No results for input(s): COLORURINE, LABSPEC, PHURINE, GLUCOSEU, HGBUR, BILIRUBINUR, KETONESUR, PROTEINUR, UROBILINOGEN, NITRITE, LEUKOCYTESUR in the last 72 hours.  Invalid input(s): APPERANCEUR    Imaging: DG Chest Port 1 View  Result Date: 01/30/2020 CLINICAL DATA:  Abdominal pain, dyspnea EXAM: PORTABLE CHEST 1 VIEW COMPARISON:  01/24/2020 FINDINGS: Cardiomegaly. Unchanged diffuse bilateral interstitial pulmonary opacity. The visualized skeletal structures are unremarkable. IMPRESSION: 1. Unchanged diffuse bilateral interstitial pulmonary opacity, most consistent with edema. No focal airspace opacity. 2. Cardiomegaly. Electronically Signed   By: Eddie Candle M.D.   On: 01/30/2020 16:56     Medications:   . sodium chloride     . bisacodyl  10 mg Oral Daily  . buPROPion  150 mg Oral Daily  . clonazePAM  0.5 mg Oral BID  . divalproex  500 mg Oral BID  . docusate sodium  100 mg Oral BID  . enoxaparin (LOVENOX)  injection  40 mg Subcutaneous Q24H  . fluticasone furoate-vilanterol  1 puff Inhalation Daily  . furosemide  20 mg Oral BID  . lurasidone  20 mg Oral BID  . metoprolol succinate  12.5 mg Oral Daily  . nicotine  21 mg Transdermal Daily  . NIFEdipine  60 mg Oral Daily  . OLANZapine  10 mg Oral BID  . pantoprazole  40 mg Oral Daily  . polyethylene glycol  17 g Oral BID  . sodium chloride flush  3 mL Intravenous Q12H  . sodium zirconium cyclosilicate  10 g Oral Daily  . sucralfate  1 g Oral TID WC & HS   sodium chloride, acetaminophen **OR** acetaminophen, albuterol, dicyclomine, guaiFENesin, hydrALAZINE, hydrOXYzine, neomycin-bacitracin-polymyxin, ondansetron **OR** ondansetron (ZOFRAN) IV, oxyCODONE, polyvinyl alcohol, traZODone  Assessment/ Plan:   Natalie Thomas is a 59 y.o. white female with congestive heart failure, chronic hyponatremia, COPD, depression, diabetes mellitus type 2, hypertension, GERD, hyperlipidemia, vulvar cancer, schizoaffective disorder, history of psychogenic polydipsia who was admitted to [facility] on 01/24/2020 for Hyponatremia [E87.1] Abdominal pain, unspecified abdominal location [R10.9]  1.  Hyponatremia: acute on chronic. Edema on examination. Suspect hypovolemic hyponatremia, thiazide diuretics and reset osmostat.  - Continue furosemide 20mg  PO bid - fluid restriction at 158mL.   2. Acute kidney injury with hyperkalemia: secondary to prerenal azotemia.  Creatinine back to baseline.  - Discontinue lokelma for hyperkalemia  - holding chlorthalidone and lisinopril.  3. Hypertension: blood pressure is at goal 135/75 - nifedipine and metoprolol   LOS: 7 Natalie Thomas 8/9/20211:10 PM

## 2020-02-01 NOTE — Care Management Important Message (Signed)
Important Message  Patient Details  Name: Natalie Thomas MRN: [id number] Date of Birth: 1961-04-24   Medicare Important Message Given:  Yes     Dannette Barbara 02/01/2020, 11:48 AM

## 2020-02-02 LAB — BASIC METABOLIC PANEL
Anion gap: 10 (ref 5–15)
BUN: 21 mg/dL — ABNORMAL HIGH (ref 6–20)
CO2: 33 mmol/L — ABNORMAL HIGH (ref 22–32)
Calcium: 8.7 mg/dL — ABNORMAL LOW (ref 8.9–10.3)
Chloride: 87 mmol/L — ABNORMAL LOW (ref 98–111)
Creatinine, Ser: 0.61 mg/dL (ref 0.44–1.00)
GFR calc Af Amer: >60 mL/min (ref >60)
GFR calc non Af Amer: >60 mL/min (ref >60)
Glucose, Bld: 108 mg/dL — ABNORMAL HIGH (ref 70–99)
Potassium: 4.2 mmol/L (ref 3.5–5.1)
Sodium: 130 mmol/L — ABNORMAL LOW (ref 135–145)

## 2020-02-02 LAB — SARS CORONAVIRUS 2 BY RT PCR (HOSPITAL ORDER, PERFORMED IN ~~LOC~~ HOSPITAL LAB): SARS Coronavirus 2: NEGATIVE

## 2020-02-02 LAB — GLUCOSE, CAPILLARY: Glucose-Capillary: 96 mg/dL (ref 70–99)

## 2020-02-02 MED ORDER — FUROSEMIDE 20 MG PO TABS: 20.0000 mg | ORAL_TABLET | Freq: Two times a day (BID) | ORAL | Status: DC

## 2020-02-02 NOTE — Progress Notes (Signed)
Natalie Thomas to be D/C'd [name] per MD order.  Discussed prescriptions and follow up appointments with the patient. Prescriptions given to patient, medication list explained in detail. Pt verbalized understanding.  Allergies as of 02/02/2020      Reactions   Clarithromycin Itching   Penicillins Itching   Has tolerated cefazolin before Has patient had a PCN reaction causing immediate rash, facial/tongue/throat swelling, SOB or lightheadedness with hypotension: No Has patient had a PCN reaction causing severe rash involving mucus membranes or skin necrosis: No Has patient had a PCN reaction that required hospitalization: Unknown Has patient had a PCN reaction occurring within the last 10 years: No If all of the above answers are "NO", then may proceed with Cephalosporin use.      Medication List    STOP taking these medications   chlorthalidone 50 MG tablet Commonly known as: HYGROTON   lisinopril 10 MG tablet Commonly known as: ZESTRIL     TAKE these medications   acetaminophen 500 MG tablet Commonly known as: TYLENOL Take 1,000 mg by mouth every 8 (eight) hours as needed for mild pain or moderate pain. Not to exceed 3g/24h of tylenol from all sources.   albuterol 108 (90 Base) MCG/ACT inhaler Commonly known as: VENTOLIN HFA Inhale 2 puffs into the lungs every 6 (six) hours as needed for wheezing or shortness of breath.   albuterol (2.5 MG/3ML) 0.083% nebulizer solution Commonly known as: PROVENTIL Take 3 mLs (2.5 mg total) by nebulization every 4 (four) hours as needed for wheezing or shortness of breath.   ANTACID LIQUID PO Take 30 mLs by mouth every 6 (six) hours as needed (heartburn).   ascorbic acid 1000 MG tablet Commonly known as: VITAMIN C Take 1 tablet (1,000 mg total) by mouth daily.   aspirin 81 MG EC tablet Take 1 tablet (81 mg total) by mouth daily.   Breo Ellipta 100-25 MCG/INH Aepb Generic drug: fluticasone furoate-vilanterol Inhale 1  puff into the lungs daily.   buPROPion 150 MG 24 hr tablet Commonly known as: WELLBUTRIN XL Take 150 mg by mouth daily.   Calcium Carb-Cholecalciferol 600-400 MG-UNIT Tabs Take 1 tablet by mouth daily.   clonazePAM 1 MG tablet Commonly known as: KLONOPIN Take 0.5 tablets (0.5 mg total) by mouth 2 (two) times daily.   divalproex 250 MG 24 hr tablet Commonly known as: DEPAKOTE ER Take 250 mg by mouth 2 (two) times daily.   docusate sodium 100 MG capsule Commonly known as: COLACE Take 100 mg by mouth 2 (two) times daily.   feeding supplement (ENSURE ENLIVE) Liqd Take 237 mLs by mouth daily.   furosemide 20 MG tablet Commonly known as: LASIX Take 1 tablet (20 mg total) by mouth 2 (two) times daily.   guaiFENesin 100 MG/5ML Soln Commonly known as: ROBITUSSIN Take 15 mLs by mouth every 4 (four) hours as needed for cough or to loosen phlegm.   hydrOXYzine 25 MG tablet Commonly known as: ATARAX/VISTARIL Take 25 mg by mouth every 6 (six) hours as needed for anxiety or itching.   ipratropium-albuterol 0.5-2.5 (3) MG/3ML Soln Commonly known as: DUONEB Take 3 mLs by nebulization 3 (three) times daily.   loratadine 10 MG tablet Commonly known as: CLARITIN Take 10 mg by mouth daily.   lurasidone 20 MG Tabs tablet Commonly known as: LATUDA Take 1 tablet (20 mg total) by mouth 2 (two) times daily.   magnesium hydroxide 400 MG/5ML suspension Commonly known as: MILK OF MAGNESIA Take 30 mLs by  mouth daily as needed for mild constipation or moderate constipation.   melatonin 5 MG Tabs Take 5 mg by mouth at bedtime.   metoprolol succinate 25 MG 24 hr tablet Commonly known as: TOPROL-XL Take 0.5 tablets (12.5 mg total) by mouth daily.   multivitamin with minerals Tabs tablet Take 1 tablet by mouth daily.   neomycin-bacitracin-polymyxin ointment Commonly known as: NEOSPORIN Apply 1 application topically as needed for wound care.   nicotine 21 mg/24hr patch Commonly known  as: NICODERM CQ - dosed in mg/24 hours Place 21 mg onto the skin daily.   NIFEdipine 60 MG 24 hr tablet Commonly known as: ADALAT CC Take 1 tablet (60 mg total) by mouth daily.   nystatin powder Generic drug: nystatin Apply 1 application topically 2 (two) times daily as needed (irritation). Apply topically to reddened area twice daily as needed for irritation   OLANZapine 10 MG tablet Commonly known as: ZYPREXA Take 10 mg by mouth 2 (two) times daily.   ondansetron 8 MG tablet Commonly known as: ZOFRAN Take 8 mg by mouth every 6 (six) hours as needed for nausea or vomiting.   pantoprazole 40 MG tablet Commonly known as: Protonix Take 1 tablet (40 mg total) by mouth daily.   polyethylene glycol 17 g packet Commonly known as: MiraLax Take 17 g by mouth daily. What changed: additional instructions   sodium chloride 0.65 % Soln nasal spray Commonly known as: OCEAN Place 2 sprays into both nostrils every 2 (two) hours as needed (dryness, irritation).   sucralfate 1 GM/10ML suspension Commonly known as: CARAFATE Take 10 mLs (1 g total) by mouth 4 (four) times daily -  with meals and at bedtime.       Vitals:   02/02/20 0429 02/02/20 1300  BP: (!) 143/78 138/83  Pulse: 80 79  Resp: 18 16  Temp: 97.7 F (36.5 C) 97.6 F (36.4 C)  SpO2: 99% 98%    Skin clean, dry and intact without evidence of skin break down, no evidence of skin tears noted. IV catheter discontinued intact. Site without signs and symptoms of complications. Dressing and pressure applied. Pt denies pain at this time. No complaints noted.  An After Visit Summary was printed and given to the patient. Patient escorted via stretcher and D/C EMS to [facility].  Fuller Mandril, RN

## 2020-02-02 NOTE — Progress Notes (Signed)
Called report to [facility]. EMS called, patient 3rd in queue for pick up.   Fuller Mandril, RN

## 2020-02-02 NOTE — Progress Notes (Signed)
Natalie Thomas  ROUNDING NOTE   Subjective:   Na 130 (129) (126) (125)  Patient is complaining about her fluid restriction  Objective:  Vital signs in last 24 hours:  Temp:  [97.6 F (36.4 C)-98.1 F (36.7 C)] 97.6 F (36.4 C) (08/10 1300) Pulse Rate:  [79-84] 79 (08/10 1300) Resp:  [16-18] 16 (08/10 1300) BP: (131-143)/(72-85) 138/83 (08/10 1300) SpO2:  [98 %-99 %] 98 % (08/10 1300) Weight:  [103.1 kg] 103.1 kg (08/10 0441)  Weight change: 1.1 kg Filed Weights   01/27/20 0500 02/01/20 0500 02/02/20 0441  Weight: 99.2 kg 102 kg 103.1 kg    Intake/Output: I/O last 3 completed shifts: In: 960 [P.O.:960] Out: 3975 [Urine:3975]   Intake/Output this shift:  No intake/output data recorded.  Physical Exam: General:  No acute distress  Head:  Normocephalic, atraumatic. Moist oral mucosal membranes  Eyes:  Anicteric  Neck:  Supple  Lungs:   Clear to auscultation, normal effort  Heart:  regular  Abdomen:   +tender periumbilical. +ventral hernia.   Extremities:  no peripheral edema.  Neurologic:  Awake, alert, following commands  Skin:  No lesions       Basic Metabolic Panel: Recent Labs  Lab 01/29/20 0420 01/29/20 0420 01/30/20 0433 01/30/20 0433 01/31/20 0438 01/31/20 1812 02/01/20 0545 02/02/20 0617  NA 122*   < > 125*  --  126* 124* 129* 130*  K 5.1  --  4.3  --  4.8  --  4.3 4.2  CL 86*  --  90*  --  89*  --  88* 87*  CO2 28  --  27  --  28  --  32 33*  GLUCOSE 100*  --  117*  --  99  --  96 108*  BUN 46*  --  25*  --  17  --  15 21*  CREATININE 1.23*  --  0.83  --  0.57  --  0.67 0.61  CALCIUM 8.3*   < > 8.2*   < > 8.6*  --  8.7* 8.7*   < > = values in this interval not displayed.    Liver Function Tests: No results for input(s): AST, ALT, ALKPHOS, BILITOT, PROT, ALBUMIN in the last 168 hours. No results for input(s): LIPASE, AMYLASE in the last 168 hours. No results for input(s): AMMONIA in the last 168 hours.  CBC: Recent Labs  Lab  01/27/20 0428  WBC 8.9  NEUTROABS 6.6  HGB 11.2*  HCT 34.0*  MCV 85.6  PLT 185    Cardiac Enzymes: No results for input(s): CKTOTAL, CKMB, CKMBINDEX, TROPONINI in the last 168 hours.  BNP: Invalid input(s): POCBNP  CBG: Recent Labs  Lab 02/02/20 0729  GLUCAP 31    Microbiology: Results for orders placed or performed during the hospital encounter of 01/24/20  SARS Coronavirus 2 by RT PCR (hospital order, performed in [facility] hospital lab) Nasopharyngeal Nasopharyngeal Swab     Status: None   Collection Time: 01/24/20  7:49 PM   Specimen: Nasopharyngeal Swab  Result Value Ref Range Status   SARS Coronavirus 2 NEGATIVE NEGATIVE Final    Comment: (NOTE) SARS-CoV-2 target nucleic acids are NOT DETECTED.  The SARS-CoV-2 RNA is generally detectable in upper and lower respiratory specimens during the acute phase of infection. The lowest concentration of SARS-CoV-2 viral copies this assay can detect is 250 copies / mL. A negative result does not preclude SARS-CoV-2 infection and should not be used as the sole basis  for treatment or other patient management decisions.  A negative result may occur with improper specimen collection / handling, submission of specimen other than nasopharyngeal swab, presence of viral mutation(s) within the areas targeted by this assay, and inadequate number of viral copies (<250 copies / mL). A negative result must be combined with clinical observations, patient history, and epidemiological information.  Fact Sheet for Patients:   StrictlyIdeas.no  Fact Sheet for Healthcare Providers: BankingDealers.co.za  This test is not yet approved or  cleared by the [place] FDA and has been authorized for detection and/or diagnosis of SARS-CoV-2 by FDA under an Emergency Use Authorization (EUA).  This EUA will remain in effect (meaning this test can be used) for the duration of the COVID-19  declaration under Section 564(b)(1) of the Act, 21 U.S.C. section 360bbb-3(b)(1), unless the authorization is terminated or revoked sooner.  Performed at [facility], [address] [place], [place] [address]   MRSA PCR Screening     Status: Abnormal   Collection Time: 01/25/20  1:37 AM   Specimen: Nasopharyngeal  Result Value Ref Range Status   MRSA by PCR POSITIVE (A) NEGATIVE Final    Comment:        The GeneXpert MRSA Assay (FDA approved for NASAL specimens only), is one component of a comprehensive MRSA colonization surveillance program. It is not intended to diagnose MRSA infection nor to guide or monitor treatment for MRSA infections. RESULT CALLED TO, READ BACK BY AND VERIFIED WITH: stacey clay at [place] on 01/25/20 rww Performed at [facility], [address] [place], [place] [address]     Coagulation Studies: No results for input(s): LABPROT, INR in the last 72 hours.  Urinalysis: No results for input(s): COLORURINE, LABSPEC, PHURINE, GLUCOSEU, HGBUR, BILIRUBINUR, KETONESUR, PROTEINUR, UROBILINOGEN, NITRITE, LEUKOCYTESUR in the last 72 hours.  Invalid input(s): APPERANCEUR    Imaging: No results found.   Medications:   . sodium chloride     . bisacodyl  10 mg Oral Daily  . buPROPion  150 mg Oral Daily  . clonazePAM  0.5 mg Oral BID  . divalproex  500 mg Oral BID  . docusate sodium  100 mg Oral BID  . enoxaparin (LOVENOX) injection  40 mg Subcutaneous Q24H  . fluticasone furoate-vilanterol  1 puff Inhalation Daily  . furosemide  20 mg Oral BID  . lurasidone  20 mg Oral BID  . metoprolol succinate  12.5 mg Oral Daily  . nicotine  21 mg Transdermal Daily  . NIFEdipine  60 mg Oral Daily  . OLANZapine  10 mg Oral BID  . pantoprazole  40 mg Oral Daily  . polyethylene glycol  17 g Oral BID  . Ensure Max Protein  11 oz Oral Daily  . sodium chloride flush  3 mL Intravenous Q12H  . sucralfate  1 g Oral TID WC & HS   sodium chloride,  acetaminophen **OR** acetaminophen, albuterol, dicyclomine, guaiFENesin, hydrALAZINE, hydrOXYzine, neomycin-bacitracin-polymyxin, ondansetron **OR** ondansetron (ZOFRAN) IV, oxyCODONE, polyvinyl alcohol, traZODone  Assessment/ Plan:   Natalie Thomas is a 59 y.o. white female with congestive heart failure, chronic hyponatremia, COPD, depression, diabetes mellitus type 2, hypertension, GERD, hyperlipidemia, vulvar cancer, schizoaffective disorder, history of psychogenic polydipsia who was admitted to [facility] on 01/24/2020 for Hyponatremia [E87.1] Abdominal pain, unspecified abdominal location [R10.9]  1.  Hyponatremia: acute on chronic. Edema on examination. Suspect hypovolemic hyponatremia, thiazide diuretics and reset osmostat.  - Continue furosemide 20mg  PO bid - fluid restriction at 1560mL.  2. Acute Thomas injury with hyperkalemia: secondary to prerenal azotemia.  Creatinine back to baseline.  - Do not restart chlorthalidone and lisinopril at this time.   3. Hypertension: blood pressure is at goal 138/83 - furosemide, nifedipine and metoprolol   LOS: 8 Andrews Tener 8/10/20211:13 PM

## 2020-02-02 NOTE — Discharge Summary (Addendum)
Discharge Summary  NONNA RENNINGER AST:[id number] DOB: 06/26/1960  PCP: System, Pcp Not In  Admit date: 01/24/2020 Discharge date: 02/02/2020  Time spent: 40 mins  Recommendations for Outpatient Follow-up:  1. Follow-up with PCP as scheduled 2. Follow-up with nephrology as scheduled   Discharge Diagnoses:  Active Hospital Problems   Diagnosis Date Noted  . Schizoaffective disorder, bipolar type ([place]) 07/05/2011  . Chronic diastolic CHF (congestive heart failure) ([place])   . 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 Hyponatremia 07/09/2019  . Type 2 diabetes mellitus without complication ([place]) 98/92/1194  . Chronic respiratory failure with hypoxia ([place]) 10/07/2017  . DM (diabetes mellitus), type 2 ([place]) 04/08/2007  . COPD (chronic obstructive pulmonary disease) ([place]) 04/08/2007  . Chronic hyponatremia 04/08/2007    Resolved Hospital Problems  No resolved problems to display.    Discharge Condition: Stable  Diet recommendation: Heart healthy/moderate carb/fluid restriction  Vitals:   02/02/20 0429 02/02/20 1300  BP: (!) 143/78 138/83  Pulse: 80 79  Resp: 18 16  Temp: 97.7 F (36.5 C) 97.6 F (36.4 C)  SpO2: 99% 98%    History of present illness:  MAHASIN RIVIERE a 59 y.o.femalewith medical history significant forvulvar cancer, COPD, chronic hypoxic respiratory failure, schizoaffective disorder, chronic hyponatremia, hypertension, and chronic diastolic CHF, now presenting to the emergency department for evaluation of several complaints including nausea, malaise, and abdominal discomfort. Patient has a difficult time providing history but indicates longstanding cramping discomfort in her abdomen that has recently worsened, nausea, and episode of nonbloody vomiting yesterday morning, and general malaise. She denies any fevers, worsening shortness of breath or cough, or chest pain. In the ED, patient is found to be afebrile, saturating well on her usual 4 L/min of  supplemental oxygen, and with stable blood pressure. EKG features sinus rhythm with LVH. Chest x-ray notable for chronic cardiomegaly and vascular congestion with improved pulmonary edema from prior study. CT of the abdomen and pelvis demonstrates some increase in fat and bowel-containing umbilical hernia but without any obstruction or vascular compromise. Chemistry panel notable for sodium of 125, down from 130 in May 2021. CBC unremarkable. Troponin slightly elevated. Urinalysis with low specific gravity. Covid PCR is negative. Patient was given 150 cc of saline, fentanyl, and Ativan in the ED and hospitalist asked to admit for hyponatremia.      Today, patient continues to have multiple complaints including abdominal pain, complaining about her fluid restriction.  Denies any chest pain, shortness of breath, nausea/vomiting, fever/chills. Otherwise patient stable to be discharged to SNF for further rehab needs.    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, some component of volume depletion Sodium much improved today Nephrology consulted, appreciate recs Restart Lasix, hold chlorthalidone Ensure fluid restriction to 1500 mls per day Repeat chest x-ray showed unchanged bilateral interstitial pulmonary edema, no focal airspace opacity Follow-up with nephrology, PCP  Chronic abdominal pain Lipase 25, LFTs WNL Afebrile, with no leukocytosis UA with trace amount of leukocytes, with low specific gravity CT  abdomen/pelvis showed fat and bowel-containing umbilical hernia increased in size, without evidence  of mechanical obstruction or vascular compromise General surgery consulted, no further recs, not a surgical candidate Pain management, monitor closely  Chronic diastolic HF Continue beta-blocker, nifedipine Restarted Lasix, hold lisinopril  Hypertension Stable Continue meds as above  Chronic hypoxic respiratory failure History of COPD Continue supplemental oxygen  Schizoaffective disorder Continue Zyprexa, Depakote, Wellbutrin, Klonopin, Latuda  Obesity Lifestyle modification advised          Malnutrition Type:  Nutrition Problem: Inadequate oral intake Etiology: acute illness   Malnutrition Characteristics:  Signs/Symptoms: meal completion < 50%   Nutrition Interventions:  Interventions: Ensure Enlive (each supplement provides 350kcal and 20 grams of protein), Liberalize Diet, MVI   Estimated body mass index is 37.82 kg/m as calculated from the following:   Height as of this encounter: 5\' 5"  (1.651 m).   Weight as of this encounter: 103.1 kg.    Procedures:  None  Consultations:  Nephrology  Discharge Exam: BP 138/83 (BP Location: Left Arm)   Pulse 79   Temp 97.6 F (36.4 C) (Oral)   Resp 16   Ht 5\' 5"  (1.651 m)   Wt 103.1 kg   SpO2 98%   BMI 37.82 kg/m   General: NAD Cardiovascular: S1, S2 present Respiratory: Diminished breath sounds bilaterally Abdomen: Soft, nontender to touch, obese, bowel sounds present    Discharge Instructions You were cared for by a hospitalist during your hospital stay. If you have any questions about your discharge medications or the care you received while you were in the hospital after you are discharged, you can call the unit and asked to speak with the hospitalist on call if the hospitalist that took care of you is not available. Once you are discharged, your primary care physician will handle any  further medical issues. Please note that NO REFILLS for any discharge medications will be authorized once you are discharged, as it is imperative that you return to your primary care physician (or establish a relationship with a primary care physician if you do not have one) for your aftercare needs so that they can reassess your need for medications and monitor your lab values.  Discharge Instructions    Diet - low sodium heart healthy   Complete by: As directed    Increase activity slowly   Complete by: As directed      Allergies as of 02/02/2020      Reactions   Clarithromycin Itching   Penicillins Itching   Has tolerated cefazolin before Has patient had a PCN reaction causing immediate rash, facial/tongue/throat swelling, SOB or lightheadedness with hypotension: No Has patient had a PCN reaction causing severe rash involving mucus membranes or skin necrosis: No Has patient had a PCN reaction that required hospitalization: Unknown Has patient had a PCN reaction occurring within the last 10 years: No If all of the above answers are "NO", then may proceed with Cephalosporin use.      Medication List    STOP taking these medications   chlorthalidone 50 MG tablet Commonly known as: HYGROTON   lisinopril 10 MG tablet Commonly known as: ZESTRIL     TAKE these medications   acetaminophen 500 MG tablet Commonly known as: TYLENOL Take 1,000 mg by mouth every 8 (eight) hours as needed for mild pain or moderate pain. Not to exceed 3g/24h of tylenol from all sources.   albuterol 108 (90 Base) MCG/ACT inhaler Commonly known as: VENTOLIN HFA Inhale 2 puffs into the lungs every 6 (six) hours as needed for wheezing or  shortness of breath.   albuterol (2.5 MG/3ML) 0.083% nebulizer solution Commonly known as: PROVENTIL Take 3 mLs (2.5 mg total) by nebulization every 4 (four) hours as needed for wheezing or shortness of breath.   ANTACID LIQUID PO Take 30 mLs by mouth every 6 (six) hours  as needed (heartburn).   ascorbic acid 1000 MG tablet Commonly known as: VITAMIN C Take 1 tablet (1,000 mg total) by mouth daily.   aspirin 81 MG EC tablet Take 1 tablet (81 mg total) by mouth daily.   Breo Ellipta 100-25 MCG/INH Aepb Generic drug: fluticasone furoate-vilanterol Inhale 1 puff into the lungs daily.   buPROPion 150 MG 24 hr tablet Commonly known as: WELLBUTRIN XL Take 150 mg by mouth daily.   Calcium Carb-Cholecalciferol 600-400 MG-UNIT Tabs Take 1 tablet by mouth daily.   clonazePAM 1 MG tablet Commonly known as: KLONOPIN Take 0.5 tablets (0.5 mg total) by mouth 2 (two) times daily.   divalproex 250 MG 24 hr tablet Commonly known as: DEPAKOTE ER Take 250 mg by mouth 2 (two) times daily.   docusate sodium 100 MG capsule Commonly known as: COLACE Take 100 mg by mouth 2 (two) times daily.   feeding supplement (ENSURE ENLIVE) Liqd Take 237 mLs by mouth daily.   furosemide 20 MG tablet Commonly known as: LASIX Take 1 tablet (20 mg total) by mouth 2 (two) times daily.   guaiFENesin 100 MG/5ML Soln Commonly known as: ROBITUSSIN Take 15 mLs by mouth every 4 (four) hours as needed for cough or to loosen phlegm.   hydrOXYzine 25 MG tablet Commonly known as: ATARAX/VISTARIL Take 25 mg by mouth every 6 (six) hours as needed for anxiety or itching.   ipratropium-albuterol 0.5-2.5 (3) MG/3ML Soln Commonly known as: DUONEB Take 3 mLs by nebulization 3 (three) times daily.   loratadine 10 MG tablet Commonly known as: CLARITIN Take 10 mg by mouth daily.   lurasidone 20 MG Tabs tablet Commonly known as: LATUDA Take 1 tablet (20 mg total) by mouth 2 (two) times daily.   magnesium hydroxide 400 MG/5ML suspension Commonly known as: MILK OF MAGNESIA Take 30 mLs by mouth daily as needed for mild constipation or moderate constipation.   melatonin 5 MG Tabs Take 5 mg by mouth at bedtime.   metoprolol succinate 25 MG 24 hr tablet Commonly known as:  TOPROL-XL Take 0.5 tablets (12.5 mg total) by mouth daily.   multivitamin with minerals Tabs tablet Take 1 tablet by mouth daily.   neomycin-bacitracin-polymyxin ointment Commonly known as: NEOSPORIN Apply 1 application topically as needed for wound care.   nicotine 21 mg/24hr patch Commonly known as: NICODERM CQ - dosed in mg/24 hours Place 21 mg onto the skin daily.   NIFEdipine 60 MG 24 hr tablet Commonly known as: ADALAT CC Take 1 tablet (60 mg total) by mouth daily.   nystatin powder Generic drug: nystatin Apply 1 application topically 2 (two) times daily as needed (irritation). Apply topically to reddened area twice daily as needed for irritation   OLANZapine 10 MG tablet Commonly known as: ZYPREXA Take 10 mg by mouth 2 (two) times daily.   ondansetron 8 MG tablet Commonly known as: ZOFRAN Take 8 mg by mouth every 6 (six) hours as needed for nausea or vomiting.   pantoprazole 40 MG tablet Commonly known as: Protonix Take 1 tablet (40 mg total) by mouth daily.   polyethylene glycol 17 g packet Commonly known as: MiraLax Take 17 g by mouth daily. What changed: additional  instructions   sodium chloride 0.65 % Soln nasal spray Commonly known as: OCEAN Place 2 sprays into both nostrils every 2 (two) hours as needed (dryness, irritation).   sucralfate 1 GM/10ML suspension Commonly known as: CARAFATE Take 10 mLs (1 g total) by mouth 4 (four) times daily -  with meals and at bedtime.      Allergies  Allergen Reactions  . Clarithromycin Itching  . Penicillins Itching    Has tolerated cefazolin before  Has patient had a PCN reaction causing immediate rash, facial/tongue/throat swelling, SOB or lightheadedness with hypotension: No Has patient had a PCN reaction causing severe rash involving mucus membranes or skin necrosis: No Has patient had a PCN reaction that required hospitalization: Unknown Has patient had a PCN reaction occurring within the last 10 years:  No If all of the above answers are "NO", then may proceed with Cephalosporin use.     Follow-up Information    Lavonia Dana, MD. Call.   Specialty: Nephrology Why: For an appointment Contact information: [place] [address] [phone]                The results of significant diagnostics from this hospitalization (including imaging, microbiology, ancillary and laboratory) are listed below for reference.    Significant Diagnostic Studies: CT ABDOMEN PELVIS W CONTRAST  Result Date: 01/24/2020 CLINICAL DATA:  Abdominal pain EXAM: CT ABDOMEN AND PELVIS WITH CONTRAST TECHNIQUE: Multidetector CT imaging of the abdomen and pelvis was performed using the standard protocol following bolus administration of intravenous contrast. CONTRAST:  18mL OMNIPAQUE IOHEXOL 300 MG/ML  SOLN COMPARISON:  CT 07/01/2019 FINDINGS: Lower chest: Trace left pleural effusion with associated atelectasis in the left lung base. Right basilar atelectasis noted as well. Mild cardiomegaly with coronary artery calcifications and a small pericardial effusion. Hepatobiliary: No focal liver abnormality is seen. Patient is post cholecystectomy. Slight prominence of the biliary tree likely related to reservoir effect. No calcified intraductal gallstones. Pancreas: Unremarkable. No pancreatic ductal dilatation or surrounding inflammatory changes. Spleen: Normal in size. No concerning splenic lesions. Adrenals/Urinary Tract: Normal adrenal glands. Kidneys enhance and excrete symmetrically. Few subcentimeter hypoattenuating foci too small to fully characterize on CT imaging but statistically likely benign. Stable mild bilateral perinephric stranding, nonspecific. No concerning renal mass. Bilateral nonobstructing nephrolithiasis as well as vascular calcifications. Scarring seen in the interpolar right kidney. No obstructive urolithiasis or hydronephrosis. Mild bladder distention with  circumferential bladder wall thickening and indentation of the bladder base by an enlarged prostate. Stomach/Bowel: Small sliding-type hiatal hernia with fluid in the distal thoracic esophagus, correlate for reflux. Stomach and duodenum are unremarkable. No proximal small bowel thickening or dilatation. Loops of small bowel protrude into a fat and bowel containing umbilical hernia. No resulting mechanical obstruction or evidence of vascular compromise at this time. No colonic dilatation or wall thickening. Scattered colonic diverticula without focal inflammation to suggest diverticulitis. Vascular/Lymphatic: Extensive atherosclerotic calcifications within the abdominal aorta and branch vessels including exuberant calcification in the mid abdominal aorta below the renal artery origins which may result in some high-grade stenosis (2/30). Additional plaque narrowing of the celiac, SMA, renal artery and IMA origins is likely present though incompletely assessed on this non angiographic technique. No aneurysm or ectasia. No enlarged abdominopelvic lymph nodes. Reproductive: Anteverted uterus.  No concerning adnexal lesions. Other: Fat and bowel containing umbilical hernia increased in size from comparison without evidence of resulting mechanical obstruction or vascular compromise at this time. Mild edematous changes of the  bilateral flanks. Soft tissue nodules in the subcutaneous fat of the anterolateral abdominal wall may reflect injectable use. Musculoskeletal: Multilevel degenerative changes are present in the imaged portions of the spine. Unchanged compression deformities at T11 and L1 as well as the superior endplate L3. Exaggerated lumbar lordosis similar to comparison. Hyperdense changes at the SI joints may reflect augmentation of prior insufficiency fractures. 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  Additional remote posttraumatic deformities of the right superior pubic ramus. IMPRESSION: 1. Fat and bowel containing umbilical hernia increased in size from comparison without evidence of resulting mechanical obstruction or vascular compromise at this time. 2. Mild circumferential bladder wall thickening and bladder distension. Correlate for urinary retention symptoms and consider urinalysis to exclude cystitis. 3. Small sliding-type hiatal hernia with fluid in the distal thoracic esophagus, correlate for reflux. 4. Trace left pleural effusion with associated atelectasis in the left lung base. 5. Cardiomegaly and coronary atherosclerosis with stable small pericardial effusion. 6. 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 7. Remote compression deformities T11, L1. Likely augmentation of remote bilateral sacral insufficiency fractures. Correlate with procedure history. 8. Aortic Atherosclerosis (ICD10-I70.0). Electronically Signed   By: Lovena Le M.D.   On: 01/24/2020 21:44   DG Chest Port 1 View  Result Date: 01/30/2020 CLINICAL DATA:  Abdominal pain, dyspnea EXAM: PORTABLE CHEST 1 VIEW COMPARISON:  01/24/2020 FINDINGS: Cardiomegaly. Unchanged diffuse bilateral interstitial pulmonary opacity. The visualized skeletal structures are unremarkable. IMPRESSION: 1. Unchanged diffuse bilateral interstitial pulmonary opacity, most consistent with edema. No focal airspace opacity. 2. Cardiomegaly. Electronically Signed   By: Eddie Candle M.D.   On: 01/30/2020 16:56   DG Chest Portable 1 View  Result Date: 01/24/2020 CLINICAL DATA:  Chest pain.  Abdominal pain today. EXAM: PORTABLE CHEST 1 VIEW COMPARISON:  Chest radiograph 10/18/2019 FINDINGS: Chronic cardiomegaly with slight improvement from prior exam. Aortic atherosclerosis. Vascular congestion. Improved pulmonary edema from prior exam. There is mild central  peribronchial thickening. No large pleural effusion. No evidence of pneumothorax or focal airspace disease. IMPRESSION: Chronic cardiomegaly with vascular congestion. Improved pulmonary edema from prior exam. Mild central peribronchial thickening may be bronchitic or congestive. Electronically Signed   By: Keith Rake M.D.   On: 01/24/2020 18:59    Microbiology: Recent Results (from the past 240 hour(s))  SARS Coronavirus 2 by RT PCR (hospital order, performed in [facility] hospital lab) Nasopharyngeal Nasopharyngeal Swab     Status: None   Collection Time: 01/24/20  7:49 PM   Specimen: Nasopharyngeal Swab  Result Value Ref Range Status   SARS Coronavirus 2 NEGATIVE NEGATIVE Final    Comment: (NOTE) SARS-CoV-2 target nucleic acids are NOT DETECTED.  The SARS-CoV-2 RNA is generally detectable in upper and lower respiratory specimens during the acute phase of infection. The lowest concentration of SARS-CoV-2 viral copies this assay can detect is 250 copies / mL. A negative result does not preclude SARS-CoV-2 infection and should not be used as the sole basis for treatment or other patient management decisions.  A negative result may occur with improper specimen collection / handling, submission of specimen other than nasopharyngeal swab, presence of viral mutation(s) within the areas targeted by this assay, and inadequate number of viral copies (<250 copies / mL). A negative result must be combined with clinical observations, patient history, and epidemiological information.  Fact  Sheet for Patients:   StrictlyIdeas.no  Fact Sheet for Healthcare Providers: BankingDealers.co.za  This test is not yet approved or  cleared by the [place] FDA and has been authorized for detection and/or diagnosis of SARS-CoV-2 by FDA under an Emergency Use Authorization (EUA).  This EUA will remain in effect (meaning this test can be used) for the  duration of the COVID-19 declaration under Section 564(b)(1) of the Act, 21 U.S.C. section 360bbb-3(b)(1), unless the authorization is terminated or revoked sooner.  Performed at [facility] [address] [place], [place] [address]   MRSA PCR Screening     Status: Abnormal   Collection Time: 01/25/20  1:37 AM   Specimen: Nasopharyngeal  Result Value Ref Range Status   MRSA by PCR POSITIVE (A) NEGATIVE Final    Comment:        The GeneXpert MRSA Assay (FDA approved for NASAL specimens only), is one component of a comprehensive MRSA colonization surveillance program. It is not intended to diagnose MRSA infection nor to guide or monitor treatment for MRSA infections. RESULT CALLED TO, READ BACK BY AND VERIFIED WITH: stacey clay at 0348 on 01/25/20 rww Performed at [facility], [address] [place], [place] [address]      Labs: Basic Metabolic Panel: Recent Labs  Lab 01/29/20 0420 01/29/20 0420 01/30/20 0433 01/31/20 0438 01/31/20 1812 02/01/20 0545 02/02/20 0617  NA 122*   < > 125* 126* 124* 129* 130*  K 5.1  --  4.3 4.8  --  4.3 4.2  CL 86*  --  90* 89*  --  88* 87*  CO2 28  --  27 28  --  32 33*  GLUCOSE 100*  --  117* 99  --  96 108*  BUN 46*  --  25* 17  --  15 21*  CREATININE 1.23*  --  0.83 0.57  --  0.67 0.61  CALCIUM 8.3*  --  8.2* 8.6*  --  8.7* 8.7*   < > = values in this interval not displayed.   Liver Function Tests: No results for input(s): AST, ALT, ALKPHOS, BILITOT, PROT, ALBUMIN in the last 168 hours. No results for input(s): LIPASE, AMYLASE in the last 168 hours. No results for input(s): AMMONIA in the last 168 hours. CBC: Recent Labs  Lab 01/27/20 0428  WBC 8.9  NEUTROABS 6.6  HGB 11.2*  HCT 34.0*  MCV 85.6  PLT 185   Cardiac Enzymes: No results for input(s): CKTOTAL, CKMB, CKMBINDEX, TROPONINI in the last 168 hours. BNP: BNP (last 3 results) Recent Labs    09/22/19 1535 10/01/19 1519 10/18/19 2002  BNP  624.0* 800.0* 580.0*    ProBNP (last 3 results) No results for input(s): PROBNP in the last 8760 hours.  CBG: Recent Labs  Lab 02/02/20 0729  GLUCAP 96       Signed:  Alma Friendly, MD [facility] 02/02/2020, 1:52 PM

## 2020-02-10 ENCOUNTER — Non-Acute Institutional Stay: Payer: Medicare Other | Admitting: Nurse Practitioner

## 2020-02-10 ENCOUNTER — Encounter: Payer: Self-pay | Admitting: Nurse Practitioner

## 2020-02-10 VITALS — BP 133/80 | Temp 97.3°F | Wt 210.0 lb

## 2020-02-10 DIAGNOSIS — I509 Heart failure, unspecified: Secondary | ICD-10-CM

## 2020-02-10 DIAGNOSIS — Z515 Encounter for palliative care: Secondary | ICD-10-CM

## 2020-02-10 NOTE — Progress Notes (Signed)
[facility] Consult Note Telephone: [phone]  Fax: [phone]  PATIENT NAME: Natalie Thomas DOB: [birth year] MRN: [id number]  PRIMARY CARE PROVIDER:Dr Yves Dill PROVIDER:Dr Hodges/Condon Coleridge [phone], [phone]  RECOMMENDATIONS and PLAN: 1.ACP:DNR in place  2.Palliative care encounter; Palliative medicine team will continue to support patient, patient's family, and medical team. Visit consisted of counseling and education dealing with the complex and emotionally intense issues of symptom management and palliative care in the setting of serious and potentially life-threatening illness  I spent 60 minutes providing this consultation, start at 1:30pm. More than 50% of the time in this consultation was spent coordinating communication.   HISTORY OF PRESENT ILLNESS:  Natalie Thomas is a 59 y.o. year old female with multiple medical problems including Malignant neoplasm volvulus, polycythemia, diastolic congestive heart failure, history of pleural effusion, chronic respiratory failure with hypoxia and hypercapnia, anemia, COPD, hypertension, diabetes, chronic hyponatremia, hyperlipidemia, gerd, lumbar disc disease, osteoporosis, schizoaffective disorder bipolar type, anxiety, depression, right intramedullary nail intertrochanteric, pelvic surgery for fracture, eye surgery.    Hospitalized 3 / 05/2020 to 3 /16 /2021 for acute on chronic respiratory failure with hypoxia secondary to covid pneumonia and congestive heart failure  Hospitalized 09/13/2019 to 3 / 24 / 2021 for acute on chronic respiratory failure with hypoxia, hypercapnia requiring BiPAP, chronic hyponatremia  Hospitalized 3 / 30 / 2021 to 4 / 3 / 2021 for worsening shortness of breath with work up showing bilateral pleural effusions, pulmonary edema admitted for acute exacerbation of chronic  systolic congestive heart failure and bilateral pleural effusions. She didn't undergo a left-sided thoracentesis.  Hospitalized for 01/2020 to 4 / 12 / 2021 for acute on chronic respiratory failure due to acute on chronic systolic and diastolic congestive heart failure  Hospitalized 4 / 25/2021 to 5 / 3 / 2021 for increase work of breathing requiring CPAP then BiPAP and drinking significant amount of water. We're going to significant for acute on chronic hypoxic respiratory failure secondary to congestive heart failure combined Systolic/Diastolic   Hospitalize 8 /08/8180 to 02/02/2020 for nausea, abdominal discomfort, malaise with chest x-ray noted for chronic cardiomegaly and Vascular congestion with improved pulmonary edema from prior study. CT abdomen and pelvis increase fat and foul containing umbilical hernia without any obstruction or vascular compromise. Sodium 125. 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 Chronic abdominal pain. Chronic diastolic heart failure. Chronic hypoxic respiratory failure continued on oxygen. Schizoaffective  Ms. Northrop was discharged back to [facility] where she does continue to resize long-term care. Ms. Borjon transfers to the wheelchair with maximum assistance. Ms Petrakis is able to maneuver her wheelchair and propel in the halls at the facility. Ms. Nickle does require total ADL assistance including toileting. Ms. Bergthold feed herself and appetite remains to declines. Ms. Alvelo is on continuous oxygen. Staff endorses that she seems to be back to her baseline re-hospitalization. At present Ms. Skellenger is sitting in the wheelchair in the hall. Ms. Fletes appears overall chronically ill though no distress, she is wearing her oxygen. I visited and Mr. Sides. Ms. Rhudy and I talked about purpose of palliative care visit though limited with mild cognitive impairment. We talked  about symptoms of pain and shortness of breath when she endorsed is she currently is not experienced thing. We talked about her declined appetite. We talked about her recent hospitalization. We talked  about importance of mobility. Ms. Aguado endorses Ms. Besaw "wants to go home". We talked about facility, coping strategies has this is her permanent home. Ms. Horney was cooperative with assessment. Medical goals reviewed. Will continue to monitor Ms. Locken closely with palliative care if she has a high risk for rehabilitation. Ms. Mello is under DSS guardianship will contact for further discussion of medical goals of care. I updated nursing staff many changes at present time medical goals for plan of care. Therapeutic listening and emotional support provided. Contact information. Questions answered to satisfaction.  Palliative Care was asked to help to continue to address goals of care.   CODE STATUS: DNR  PPS: 40% HOSPICE ELIGIBILITY/DIAGNOSIS: TBD  PAST MEDICAL HISTORY:  Past Medical History:  Diagnosis Date  . Anemia 10/16/2017  . Anxiety and depression   . CHF (congestive heart failure) ([place])   . Chronic hyponatremia 04/08/2007   Qualifier: Diagnosis of  By: Marca Ancona RMA, Lucy    . Closed comminuted intertrochanteric fracture of right femur ([place]) 11/07/2017  . COPD 04/08/2007   Qualifier: Diagnosis of  By: Marca Ancona RMA, Lucy    . Depression   . Diabetes mellitus, type 2 ([place])    pt denies but states that she has been treated for DM  . Essential hypertension 04/08/2007   Qualifier: Diagnosis of  By: Marca Ancona RMA, Lucy    . GERD (gastroesophageal reflux disease)   . Hyperlipidemia 10/07/2017  . Lumbar disc disease 04/09/2011  . MENOPAUSE, PREMATURE 04/08/2007   Qualifier: Diagnosis of  By: Marca Ancona RMA, Lucy    . 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 Osteoporosis 04/08/2007   Qualifier: Diagnosis of  By: Reatha Armour, Lucy    . Polycythemia  secondary to smoking 04/09/2011  . 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 Schizoaffective disorder, bipolar type ([place]) 07/05/2011    SOCIAL HX:  Social History   Tobacco Use  . Smoking status: Former Smoker    Packs/day: 0.25    Types: Cigarettes    Quit date: 07/23/2019    Years since quitting: 0.5  . Smokeless tobacco: Never Used  Substance Use Topics  . Alcohol use: No    ALLERGIES:  Allergies  Allergen Reactions  . Clarithromycin Itching  . Penicillins Itching    Has tolerated cefazolin before  Has patient had a PCN reaction causing immediate rash, facial/tongue/throat swelling, SOB or lightheadedness with hypotension: No Has patient had a PCN reaction causing severe rash involving mucus membranes or skin necrosis: No Has patient had a PCN reaction that required hospitalization: Unknown Has patient had a PCN reaction occurring within the last 10 years: No If all of the above answers are "NO", then may proceed with Cephalosporin use.      PERTINENT MEDICATIONS:  Outpatient Encounter Medications as of 02/10/2020  Medication Sig  . acetaminophen (TYLENOL) 500 MG tablet Take 1,000 mg by mouth every 8 (eight) hours as needed for mild pain or moderate pain. Not to exceed 3g/24h of tylenol from all sources.  Marland Kitchen albuterol (PROVENTIL) (2.5 MG/3ML) 0.083% nebulizer solution Take 3 mLs (2.5 mg total) by nebulization every 4 (four) hours as needed for wheezing or shortness of breath.  Marland Kitchen albuterol (VENTOLIN HFA) 108 (90 Base) MCG/ACT inhaler Inhale 2 puffs into the lungs every 6 (six) hours as needed for wheezing or shortness of breath.  . Alum & Mag Hydroxide-Simeth (ANTACID LIQUID PO) Take 30 mLs by mouth every 6 (six) hours  as needed (heartburn).  Marland Kitchen ascorbic acid (VITAMIN C) 1000 MG tablet Take 1 tablet (1,000 mg total) by mouth daily.  Marland Kitchen aspirin EC 81 MG EC tablet Take 1 tablet (81 mg total) by mouth daily. (Patient not taking: Reported on 01/24/2020)  . buPROPion (WELLBUTRIN XL) 150 MG 24  hr tablet Take 150 mg by mouth daily.  . Calcium Carb-Cholecalciferol 600-400 MG-UNIT TABS Take 1 tablet by mouth daily.  . clonazePAM (KLONOPIN) 1 MG tablet Take 0.5 tablets (0.5 mg total) by mouth 2 (two) times daily.  . divalproex (DEPAKOTE ER) 250 MG 24 hr tablet Take 250 mg by mouth 2 (two) times daily.  Marland Kitchen docusate sodium (COLACE) 100 MG capsule Take 100 mg by mouth 2 (two) times daily.   . feeding supplement, ENSURE ENLIVE, (ENSURE ENLIVE) LIQD Take 237 mLs by mouth daily. (Patient not taking: Reported on 01/24/2020)  . fluticasone furoate-vilanterol (BREO ELLIPTA) 100-25 MCG/INH AEPB Inhale 1 puff into the lungs daily.  . furosemide (LASIX) 20 MG tablet Take 1 tablet (20 mg total) by mouth 2 (two) times daily.  Marland Kitchen guaiFENesin (ROBITUSSIN) 100 MG/5ML SOLN Take 15 mLs by mouth every 4 (four) hours as needed for cough or to loosen phlegm.  . hydrOXYzine (ATARAX/VISTARIL) 25 MG tablet Take 25 mg by mouth every 6 (six) hours as needed for anxiety or itching.  Marland Kitchen ipratropium-albuterol (DUONEB) 0.5-2.5 (3) MG/3ML SOLN Take 3 mLs by nebulization 3 (three) times daily.  Marland Kitchen loratadine (CLARITIN) 10 MG tablet Take 10 mg by mouth daily.  Marland Kitchen lurasidone (LATUDA) 20 MG TABS tablet Take 1 tablet (20 mg total) by mouth 2 (two) times daily.  . magnesium hydroxide (MILK OF MAGNESIA) 400 MG/5ML suspension Take 30 mLs by mouth daily as needed for mild constipation or moderate constipation.  . Melatonin 5 MG TABS Take 5 mg by mouth at bedtime.  . metoprolol succinate (TOPROL-XL) 25 MG 24 hr tablet Take 0.5 tablets (12.5 mg total) by mouth daily.  . Multiple Vitamin (MULTIVITAMIN WITH MINERALS) TABS tablet Take 1 tablet by mouth daily.  Marland Kitchen neomycin-bacitracin-polymyxin (NEOSPORIN) ointment Apply 1 application topically as needed for wound care.  . nicotine (NICODERM CQ - DOSED IN MG/24 HOURS) 21 mg/24hr patch Place 21 mg onto the skin daily.   Marland Kitchen NIFEdipine (ADALAT CC) 60 MG 24 hr tablet Take 1 tablet (60 mg total) by  mouth daily.  Marland Kitchen nystatin (NYSTATIN) powder Apply 1 application topically 2 (two) times daily as needed (irritation). Apply topically to reddened area twice daily as needed for irritation  . OLANZapine (ZYPREXA) 10 MG tablet Take 10 mg by mouth 2 (two) times daily.  . ondansetron (ZOFRAN) 8 MG tablet Take 8 mg by mouth every 6 (six) hours as needed for nausea or vomiting.  . pantoprazole (PROTONIX) 40 MG tablet Take 1 tablet (40 mg total) by mouth daily.  . polyethylene glycol (MIRALAX) 17 g packet Take 17 g by mouth daily. (Patient taking differently: Take 17 g by mouth daily. Mix 1 capful (17 g) in 4-8 oz of water or juice and give by mouth once daily for bowel regimen)  . sodium chloride (OCEAN) 0.65 % SOLN nasal spray Place 2 sprays into both nostrils every 2 (two) hours as needed (dryness, irritation).  . sucralfate (CARAFATE) 1 GM/10ML suspension Take 10 mLs (1 g total) by mouth 4 (four) times daily -  with meals and at bedtime.   No facility-administered encounter medications on file as of 02/10/2020.    PHYSICAL EXAM:  General: obese, chronically ill, O2 dependent, debilitated female Cardiovascular: regular rate and rhythm Pulmonary: clear ant fields Neurological: non-ambulatory; w/c dependent  Foy Mungia Ihor Gully, NP

## 2020-02-11 ENCOUNTER — Other Ambulatory Visit: Payer: Self-pay

## 2020-02-13 ENCOUNTER — Other Ambulatory Visit: Payer: Self-pay

## 2020-02-13 ENCOUNTER — Emergency Department: Payer: Medicare Other

## 2020-02-13 ENCOUNTER — Inpatient Hospital Stay
Admission: EM | Admit: 2020-02-13 | Discharge: 2020-02-19 | DRG: 291 | Disposition: A | Discharge status: Skilled Nursing Facility | Payer: Medicare Other | Source: Skilled Nursing Facility | Attending: Internal Medicine | Admitting: Internal Medicine

## 2020-02-13 ENCOUNTER — Inpatient Hospital Stay: Payer: Medicare Other

## 2020-02-13 DIAGNOSIS — F250 Schizoaffective disorder, bipolar type: Secondary | ICD-10-CM | POA: Diagnosis present

## 2020-02-13 DIAGNOSIS — R062 Wheezing: Secondary | ICD-10-CM

## 2020-02-13 DIAGNOSIS — E785 Hyperlipidemia, unspecified: Secondary | ICD-10-CM | POA: Diagnosis present

## 2020-02-13 DIAGNOSIS — J9621 Acute and chronic respiratory failure with hypoxia: Secondary | ICD-10-CM | POA: Diagnosis present

## 2020-02-13 DIAGNOSIS — J9620 Acute and chronic respiratory failure, unspecified whether with hypoxia or hypercapnia: Secondary | ICD-10-CM | POA: Diagnosis not present

## 2020-02-13 DIAGNOSIS — E119 Type 2 diabetes mellitus without complications: Secondary | ICD-10-CM | POA: Diagnosis present

## 2020-02-13 DIAGNOSIS — I5033 Acute on chronic diastolic (congestive) heart failure: Secondary | ICD-10-CM

## 2020-02-13 DIAGNOSIS — F32A Depression, unspecified: Secondary | ICD-10-CM | POA: Diagnosis present

## 2020-02-13 DIAGNOSIS — Z880 Allergy status to penicillin: Secondary | ICD-10-CM

## 2020-02-13 DIAGNOSIS — M810 Age-related osteoporosis without current pathological fracture: Secondary | ICD-10-CM | POA: Diagnosis present

## 2020-02-13 DIAGNOSIS — R631 Polydipsia: Secondary | ICD-10-CM | POA: Diagnosis present

## 2020-02-13 DIAGNOSIS — K219 Gastro-esophageal reflux disease without esophagitis: Secondary | ICD-10-CM | POA: Diagnosis present

## 2020-02-13 DIAGNOSIS — I509 Heart failure, unspecified: Secondary | ICD-10-CM

## 2020-02-13 DIAGNOSIS — R0602 Shortness of breath: Secondary | ICD-10-CM

## 2020-02-13 DIAGNOSIS — F458 Other somatoform disorders: Secondary | ICD-10-CM | POA: Diagnosis present

## 2020-02-13 DIAGNOSIS — I161 Hypertensive emergency: Secondary | ICD-10-CM | POA: Diagnosis present

## 2020-02-13 DIAGNOSIS — Z79899 Other long term (current) drug therapy: Secondary | ICD-10-CM

## 2020-02-13 DIAGNOSIS — F17210 Nicotine dependence, cigarettes, uncomplicated: Secondary | ICD-10-CM | POA: Diagnosis present

## 2020-02-13 DIAGNOSIS — I110 Hypertensive heart disease with heart failure: Principal | ICD-10-CM | POA: Diagnosis present

## 2020-02-13 DIAGNOSIS — Z20822 Contact with and (suspected) exposure to covid-19: Secondary | ICD-10-CM | POA: Diagnosis present

## 2020-02-13 DIAGNOSIS — E871 Hypo-osmolality and hyponatremia: Secondary | ICD-10-CM | POA: Diagnosis present

## 2020-02-13 DIAGNOSIS — I5031 Acute diastolic (congestive) heart failure: Secondary | ICD-10-CM | POA: Diagnosis present

## 2020-02-13 DIAGNOSIS — Z716 Tobacco abuse counseling: Secondary | ICD-10-CM

## 2020-02-13 DIAGNOSIS — Z881 Allergy status to other antibiotic agents status: Secondary | ICD-10-CM

## 2020-02-13 DIAGNOSIS — J9622 Acute and chronic respiratory failure with hypercapnia: Secondary | ICD-10-CM | POA: Diagnosis present

## 2020-02-13 DIAGNOSIS — Z6837 Body mass index (BMI) 37.0-37.9, adult: Secondary | ICD-10-CM

## 2020-02-13 DIAGNOSIS — Z9981 Dependence on supplemental oxygen: Secondary | ICD-10-CM | POA: Diagnosis not present

## 2020-02-13 DIAGNOSIS — R778 Other specified abnormalities of plasma proteins: Secondary | ICD-10-CM | POA: Diagnosis present

## 2020-02-13 DIAGNOSIS — F419 Anxiety disorder, unspecified: Secondary | ICD-10-CM | POA: Diagnosis present

## 2020-02-13 DIAGNOSIS — J441 Chronic obstructive pulmonary disease with (acute) exacerbation: Secondary | ICD-10-CM | POA: Diagnosis present

## 2020-02-13 DIAGNOSIS — E669 Obesity, unspecified: Secondary | ICD-10-CM | POA: Diagnosis present

## 2020-02-13 DIAGNOSIS — Z8616 Personal history of COVID-19: Secondary | ICD-10-CM

## 2020-02-13 DIAGNOSIS — Z7982 Long term (current) use of aspirin: Secondary | ICD-10-CM

## 2020-02-13 DIAGNOSIS — I340 Nonrheumatic mitral (valve) insufficiency: Secondary | ICD-10-CM | POA: Diagnosis present

## 2020-02-13 DIAGNOSIS — Z7952 Long term (current) use of systemic steroids: Secondary | ICD-10-CM

## 2020-02-13 LAB — CBC
HCT: 33.8 % — ABNORMAL LOW (ref 36.0–46.0)
Hemoglobin: 10.9 g/dL — ABNORMAL LOW (ref 12.0–15.0)
MCH: 28.8 pg (ref 26.0–34.0)
MCHC: 32.2 g/dL (ref 30.0–36.0)
MCV: 89.2 fL (ref 80.0–100.0)
Platelets: 200 10*3/uL (ref 150–400)
RBC: 3.79 MIL/uL — ABNORMAL LOW (ref 3.87–5.11)
RDW: 13.9 % (ref 11.5–15.5)
WBC: 9 10*3/uL (ref 4.0–10.5)
nRBC: 0 % (ref 0.0–0.2)

## 2020-02-13 LAB — BRAIN NATRIURETIC PEPTIDE: B Natriuretic Peptide: 732.2 pg/mL — ABNORMAL HIGH (ref 0.0–100.0)

## 2020-02-13 LAB — BLOOD GAS, VENOUS
Acid-Base Excess: 16.5 mmol/L — ABNORMAL HIGH (ref 0.0–2.0)
Bicarbonate: 44.4 mmol/L — ABNORMAL HIGH (ref 20.0–28.0)
FIO2: 0.55
O2 Saturation: 99.8 %
Patient temperature: 37
pCO2, Ven: 70 mmHg — ABNORMAL HIGH (ref 44.0–60.0)
pH, Ven: 7.41 (ref 7.250–7.430)
pO2, Ven: 232 mmHg — ABNORMAL HIGH (ref 32.0–45.0)

## 2020-02-13 LAB — TROPONIN I (HIGH SENSITIVITY)
Troponin I (High Sensitivity): 22 ng/L — ABNORMAL HIGH (ref <18)
Troponin I (High Sensitivity): 25 ng/L — ABNORMAL HIGH (ref <18)

## 2020-02-13 LAB — LIPASE, BLOOD: Lipase: 27 U/L (ref 11–51)

## 2020-02-13 LAB — BASIC METABOLIC PANEL
Anion gap: 11 (ref 5–15)
BUN: 13 mg/dL (ref 6–20)
CO2: 33 mmol/L — ABNORMAL HIGH (ref 22–32)
Calcium: 9 mg/dL (ref 8.9–10.3)
Chloride: 84 mmol/L — ABNORMAL LOW (ref 98–111)
Creatinine, Ser: 0.71 mg/dL (ref 0.44–1.00)
GFR calc Af Amer: >60 mL/min (ref >60)
GFR calc non Af Amer: >60 mL/min (ref >60)
Glucose, Bld: 113 mg/dL — ABNORMAL HIGH (ref 70–99)
Potassium: 4.3 mmol/L (ref 3.5–5.1)
Sodium: 128 mmol/L — ABNORMAL LOW (ref 135–145)

## 2020-02-13 LAB — SARS CORONAVIRUS 2 BY RT PCR (HOSPITAL ORDER, PERFORMED IN ~~LOC~~ HOSPITAL LAB): SARS Coronavirus 2: NEGATIVE

## 2020-02-13 MED ORDER — LORATADINE 10 MG PO TABS
10.0000 mg | ORAL_TABLET | Freq: Every day | ORAL | Status: DC
  Administered 2020-02-14 – 2020-02-19 (×6): 10 mg via ORAL
  Filled 2020-02-13 (×6): qty 1

## 2020-02-13 MED ORDER — CALCIUM CARBONATE-VITAMIN D 500-200 MG-UNIT PO TABS
1.0000 | ORAL_TABLET | Freq: Every day | ORAL | Status: DC
  Administered 2020-02-14 – 2020-02-19 (×6): 1 via ORAL
  Filled 2020-02-13 (×7): qty 1

## 2020-02-13 MED ORDER — ASPIRIN EC 81 MG PO TBEC
81.0000 mg | DELAYED_RELEASE_TABLET | Freq: Every day | ORAL | Status: DC
  Administered 2020-02-13 – 2020-02-19 (×7): 81 mg via ORAL
  Filled 2020-02-13 (×7): qty 1

## 2020-02-13 MED ORDER — DOCUSATE SODIUM 100 MG PO CAPS
100.0000 mg | ORAL_CAPSULE | Freq: Two times a day (BID) | ORAL | Status: DC
  Administered 2020-02-13 – 2020-02-19 (×12): 100 mg via ORAL
  Filled 2020-02-13 (×12): qty 1

## 2020-02-13 MED ORDER — ACETAMINOPHEN 325 MG PO TABS
650.0000 mg | ORAL_TABLET | ORAL | Status: DC | PRN
  Administered 2020-02-13 – 2020-02-19 (×3): 650 mg via ORAL
  Filled 2020-02-13 (×3): qty 2

## 2020-02-13 MED ORDER — DIVALPROEX SODIUM ER 250 MG PO TB24
250.0000 mg | ORAL_TABLET | Freq: Two times a day (BID) | ORAL | Status: DC
  Administered 2020-02-13 – 2020-02-14 (×2): 250 mg via ORAL
  Filled 2020-02-13 (×3): qty 1

## 2020-02-13 MED ORDER — METOPROLOL SUCCINATE ER 25 MG PO TB24
12.5000 mg | ORAL_TABLET | Freq: Every day | ORAL | Status: DC
  Administered 2020-02-13 – 2020-02-19 (×7): 12.5 mg via ORAL
  Filled 2020-02-13: qty 1
  Filled 2020-02-13: qty 0.5
  Filled 2020-02-13: qty 1
  Filled 2020-02-13: qty 0.5
  Filled 2020-02-13 (×4): qty 1

## 2020-02-13 MED ORDER — NITROGLYCERIN 0.4 MG SL SUBL
SUBLINGUAL_TABLET | SUBLINGUAL | Status: AC
  Filled 2020-02-13: qty 1

## 2020-02-13 MED ORDER — LABETALOL HCL 5 MG/ML IV SOLN
10.0000 mg | Freq: Once | INTRAVENOUS | Status: AC
  Administered 2020-02-13: 10 mg via INTRAVENOUS
  Filled 2020-02-13: qty 4

## 2020-02-13 MED ORDER — SODIUM CHLORIDE 0.9 % IV SOLN: 250.0000 mL | INTRAVENOUS | Status: DC | PRN

## 2020-02-13 MED ORDER — MORPHINE SULFATE (PF) 4 MG/ML IV SOLN
4.0000 mg | Freq: Once | INTRAVENOUS | Status: AC
  Administered 2020-02-13: 4 mg via INTRAVENOUS
  Filled 2020-02-13: qty 1

## 2020-02-13 MED ORDER — ACETAMINOPHEN 500 MG PO TABS: 1000.0000 mg | ORAL_TABLET | Freq: Three times a day (TID) | ORAL | Status: DC | PRN

## 2020-02-13 MED ORDER — IPRATROPIUM-ALBUTEROL 0.5-2.5 (3) MG/3ML IN SOLN
3.0000 mL | Freq: Once | RESPIRATORY_TRACT | Status: AC
  Administered 2020-02-13: 3 mL via RESPIRATORY_TRACT
  Filled 2020-02-13: qty 3

## 2020-02-13 MED ORDER — ADULT MULTIVITAMIN W/MINERALS CH
1.0000 | ORAL_TABLET | Freq: Every day | ORAL | Status: DC
  Administered 2020-02-14 – 2020-02-19 (×6): 1 via ORAL
  Filled 2020-02-13 (×6): qty 1

## 2020-02-13 MED ORDER — METHYLPREDNISOLONE SODIUM SUCC 125 MG IJ SOLR
125.0000 mg | Freq: Once | INTRAMUSCULAR | Status: AC
  Administered 2020-02-13: 125 mg via INTRAVENOUS
  Filled 2020-02-13: qty 2

## 2020-02-13 MED ORDER — HYDROXYZINE HCL 25 MG PO TABS
25.0000 mg | ORAL_TABLET | Freq: Four times a day (QID) | ORAL | Status: DC | PRN
  Administered 2020-02-13 – 2020-02-19 (×16): 25 mg via ORAL
  Filled 2020-02-13 (×18): qty 1

## 2020-02-13 MED ORDER — FLUTICASONE FUROATE-VILANTEROL 100-25 MCG/INH IN AEPB
1.0000 | INHALATION_SPRAY | Freq: Every day | RESPIRATORY_TRACT | Status: DC
  Administered 2020-02-14 – 2020-02-19 (×6): 1 via RESPIRATORY_TRACT
  Filled 2020-02-13: qty 28

## 2020-02-13 MED ORDER — CLONAZEPAM 0.5 MG PO TABS
0.5000 mg | ORAL_TABLET | Freq: Two times a day (BID) | ORAL | Status: DC | PRN
  Administered 2020-02-13 – 2020-02-19 (×13): 0.5 mg via ORAL
  Filled 2020-02-13 (×14): qty 1

## 2020-02-13 MED ORDER — SODIUM CHLORIDE 0.9% FLUSH
3.0000 mL | Freq: Two times a day (BID) | INTRAVENOUS | Status: DC
  Administered 2020-02-13 – 2020-02-18 (×11): 3 mL via INTRAVENOUS

## 2020-02-13 MED ORDER — ENOXAPARIN SODIUM 40 MG/0.4ML ~~LOC~~ SOLN
40.0000 mg | SUBCUTANEOUS | Status: DC
  Administered 2020-02-13 – 2020-02-18 (×6): 40 mg via SUBCUTANEOUS
  Filled 2020-02-13 (×6): qty 0.4

## 2020-02-13 MED ORDER — ALUM & MAG HYDROXIDE-SIMETH 200-200-20 MG/5ML PO SUSP
30.0000 mL | Freq: Four times a day (QID) | ORAL | Status: DC | PRN
  Administered 2020-02-18: 30 mL via ORAL
  Filled 2020-02-13: qty 30

## 2020-02-13 MED ORDER — MORPHINE SULFATE (PF) 2 MG/ML IV SOLN
2.0000 mg | INTRAVENOUS | Status: DC | PRN
  Administered 2020-02-14 – 2020-02-18 (×21): 2 mg via INTRAVENOUS
  Filled 2020-02-13 (×21): qty 1

## 2020-02-13 MED ORDER — NITROGLYCERIN 0.4 MG SL SUBL
0.4000 mg | SUBLINGUAL_TABLET | Freq: Once | SUBLINGUAL | Status: AC
  Administered 2020-02-13: 0.4 mg via SUBLINGUAL

## 2020-02-13 MED ORDER — ASCORBIC ACID 500 MG PO TABS
1000.0000 mg | ORAL_TABLET | Freq: Every day | ORAL | Status: DC
  Administered 2020-02-14 – 2020-02-19 (×6): 1000 mg via ORAL
  Filled 2020-02-13 (×6): qty 2

## 2020-02-13 MED ORDER — ONDANSETRON HCL 4 MG/2ML IJ SOLN: 4.0000 mg | Freq: Four times a day (QID) | INTRAMUSCULAR | Status: DC | PRN

## 2020-02-13 MED ORDER — NIFEDIPINE ER 60 MG PO TB24
60.0000 mg | ORAL_TABLET | Freq: Every day | ORAL | Status: DC
  Administered 2020-02-13 – 2020-02-19 (×7): 60 mg via ORAL
  Filled 2020-02-13 (×8): qty 1

## 2020-02-13 MED ORDER — IPRATROPIUM-ALBUTEROL 0.5-2.5 (3) MG/3ML IN SOLN
3.0000 mL | Freq: Four times a day (QID) | RESPIRATORY_TRACT | Status: DC | PRN
  Administered 2020-02-13 – 2020-02-15 (×3): 3 mL via RESPIRATORY_TRACT
  Filled 2020-02-13 (×3): qty 3

## 2020-02-13 MED ORDER — LURASIDONE HCL 20 MG PO TABS
20.0000 mg | ORAL_TABLET | Freq: Two times a day (BID) | ORAL | Status: DC
  Administered 2020-02-14 – 2020-02-19 (×9): 20 mg via ORAL
  Filled 2020-02-13 (×12): qty 1

## 2020-02-13 MED ORDER — SODIUM CHLORIDE 0.9% FLUSH: 3.0000 mL | INTRAVENOUS | Status: DC | PRN

## 2020-02-13 MED ORDER — OLANZAPINE 10 MG PO TABS
10.0000 mg | ORAL_TABLET | Freq: Two times a day (BID) | ORAL | Status: DC
  Administered 2020-02-13 – 2020-02-19 (×12): 10 mg via ORAL
  Filled 2020-02-13 (×13): qty 1

## 2020-02-13 MED ORDER — HYDRALAZINE HCL 20 MG/ML IJ SOLN
10.0000 mg | Freq: Once | INTRAMUSCULAR | Status: AC
  Administered 2020-02-13: 10 mg via INTRAVENOUS
  Filled 2020-02-13: qty 1

## 2020-02-13 MED ORDER — FUROSEMIDE 10 MG/ML IJ SOLN
40.0000 mg | Freq: Two times a day (BID) | INTRAMUSCULAR | Status: DC
  Administered 2020-02-13 – 2020-02-17 (×8): 40 mg via INTRAVENOUS
  Filled 2020-02-13 (×9): qty 4

## 2020-02-13 MED ORDER — IPRATROPIUM-ALBUTEROL 0.5-2.5 (3) MG/3ML IN SOLN
RESPIRATORY_TRACT | Status: AC
  Filled 2020-02-13: qty 3

## 2020-02-13 MED ORDER — NICOTINE 21 MG/24HR TD PT24
21.0000 mg | MEDICATED_PATCH | Freq: Every day | TRANSDERMAL | Status: DC
  Administered 2020-02-13 – 2020-02-18 (×6): 21 mg via TRANSDERMAL
  Filled 2020-02-13 (×6): qty 1

## 2020-02-13 MED ORDER — BUPROPION HCL ER (XL) 150 MG PO TB24
150.0000 mg | ORAL_TABLET | Freq: Every day | ORAL | Status: DC
  Administered 2020-02-13 – 2020-02-19 (×7): 150 mg via ORAL
  Filled 2020-02-13 (×7): qty 1

## 2020-02-13 MED ORDER — PANTOPRAZOLE SODIUM 40 MG PO TBEC
40.0000 mg | DELAYED_RELEASE_TABLET | Freq: Every day | ORAL | Status: DC
  Administered 2020-02-14 – 2020-02-19 (×6): 40 mg via ORAL
  Filled 2020-02-13 (×6): qty 1

## 2020-02-13 MED ORDER — FUROSEMIDE 10 MG/ML IJ SOLN
40.0000 mg | Freq: Once | INTRAMUSCULAR | Status: AC
  Administered 2020-02-13: 40 mg via INTRAVENOUS
  Filled 2020-02-13: qty 4

## 2020-02-13 MED ORDER — FUROSEMIDE 10 MG/ML IJ SOLN
40.0000 mg | Freq: Once | INTRAMUSCULAR | Status: AC
  Administered 2020-02-13: 40 mg via INTRAVENOUS

## 2020-02-13 MED ORDER — SUCRALFATE 1 GM/10ML PO SUSP
1.0000 g | Freq: Three times a day (TID) | ORAL | Status: DC
  Administered 2020-02-14 – 2020-02-19 (×21): 1 g via ORAL
  Filled 2020-02-13 (×28): qty 10

## 2020-02-13 MED ORDER — MELATONIN 5 MG PO TABS
5.0000 mg | ORAL_TABLET | Freq: Every day | ORAL | Status: DC
  Administered 2020-02-14 – 2020-02-18 (×6): 5 mg via ORAL
  Filled 2020-02-13 (×7): qty 1

## 2020-02-13 NOTE — ED Notes (Signed)
Diet cola and [place] sandwich tray brought to pt. Informed pt meds currently being verified by pharmacy.

## 2020-02-13 NOTE — H&P (Signed)
History and Physical    Natalie Thomas TOI:[id number] DOB: 1960/07/06 DOA: 02/13/2020  PCP: System, Pcp Not In   Patient coming from: Skilled nursing facility  I have personally briefly reviewed patient's old medical records in [place]  Chief Complaint: Shortness of breath  HPI: Natalie Thomas is a 59 y.o. female  obese Caucasian female with a known history of  chronic diastolic CHF, schizophrenia, chronic hyponatremia, chronic respiratory failure on 3 L of oxygen, dyslipidemia and psychogenic polydipsia who was sent to the ER from the skilled nursing facility for evaluation of shortness of breath which she has had for the last 3 days.  Patient states that her symptoms have been worsening over the last 3 days associated with a cough productive of clear phlegm and wheezing.  She states that she checked her pulse ox on room air and it was in the 60s and so she notified the nursing home staff that she would like to be sent to the ER for evaluation.  She also complains of chest tightness that is nonradiating.  Upon arrival to the ER patient was noted to have an increased oxygen requirement and was on 6 L to maintain pulse oximetry greater than 92%.  She received a dose of Lasix and bronchodilator therapy and has been weaned down to 3 to 4 L to maintain sats greater than 92% She denies having any nausea, no vomiting, no changes in her bowel habits, no urinary symptoms, no dizziness or lightheadedness. Vital signs show significant elevated blood pressure with systolic blood pressure above 245mmHg with respiratory rate of 26.  She is afebrile Labs reveal sodium of 128, potassium 4.3, bicarb 33, glucose 113, BUN 13, creatinine 0.71, BNP 732, troponin 22 >> 25, white count 9.0, hemoglobin 10.9 Chest x-ray reviewed by me shows cardiomegaly and small bilateral pleural effusions. Twelve-lead EKG reviewed by me shows sinus rhythm with PVCs.   ED Course: Patient is a 59 year old Caucasian  female with multiple medical problems who resides in a skilled nursing facility and was sent to the ER for evaluation of shortness of breath.  Upon arrival to the ER she was noted to have increased work of breathing as well as an increased oxygen requirement 3L >> 6L to maintain pulse oximetry greater than 92%.  She had diffuse wheezes and received bronchodilator therapy in the ER.  Chest x-ray shows CHF.  Patient will be admitted to the hospital for further evaluation.  Review of Systems: As per HPI otherwise 10 point review of systems negative.    Past Medical History:  Diagnosis Date  . Anemia 10/16/2017  . Anxiety and depression   . CHF (congestive heart failure) ([place])   . Chronic hyponatremia 04/08/2007   Qualifier: Diagnosis of  By: Marca Ancona RMA, Lucy    . Closed comminuted intertrochanteric fracture of right femur ([place]) 11/07/2017  . COPD 04/08/2007   Qualifier: Diagnosis of  By: Marca Ancona RMA, Lucy    . Depression   . Diabetes mellitus, type 2 ([place])    pt denies but states that she has been treated for DM  . Essential hypertension 04/08/2007   Qualifier: Diagnosis of  By: Marca Ancona RMA, Lucy    . GERD (gastroesophageal reflux disease)   . Hyperlipidemia 10/07/2017  . Lumbar disc disease 04/09/2011  . MENOPAUSE, PREMATURE 04/08/2007   Qualifier: Diagnosis of  By: Marca Ancona RMA, Lucy    . 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  Osteoporosis 04/08/2007   Qualifier: Diagnosis of  By: Reatha Armour, Lucy    . Polycythemia secondary to smoking 04/09/2011  . 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 Schizoaffective disorder, bipolar type ([place]) 07/05/2011    Past Surgical History:  Procedure Laterality Date  . BIOPSY  07/11/2019   Procedure: BIOPSY;  Surgeon: Jerene Bears, MD;  Location: [facility] ENDOSCOPY;  Service: Gastroenterology;;  . ESOPHAGOGASTRODUODENOSCOPY (EGD) WITH PROPOFOL N/A 07/11/2019   Procedure: ESOPHAGOGASTRODUODENOSCOPY (EGD) WITH PROPOFOL;   Surgeon: Jerene Bears, MD;  Location: [place];  Service: Gastroenterology;  Laterality: N/A;  . EYE SURGERY     on left  eye, pt states that she sees bad out of the right eye and needs surgery there  . INTRAMEDULLARY (IM) NAIL INTERTROCHANTERIC Right 11/13/2017   Procedure: INTRAMEDULLARY (IM) NAIL INTERTROCHANTRIC;  Surgeon: Shona Needles, MD;  Location: [place];  Service: Orthopedics;  Laterality: Right;  . PELVIC FRACTURE SURGERY       reports that she quit smoking about 6 months ago. Her smoking use included cigarettes. She smoked 0.25 packs per day. She has never used smokeless tobacco. She reports that she does not drink alcohol and does not use drugs.  Allergies  Allergen Reactions  . Clarithromycin Itching  . Penicillins Itching    Has tolerated cefazolin before  Has patient had a PCN reaction causing immediate rash, facial/tongue/throat swelling, SOB or lightheadedness with hypotension: No Has patient had a PCN reaction causing severe rash involving mucus membranes or skin necrosis: No Has patient had a PCN reaction that required hospitalization: Unknown Has patient had a PCN reaction occurring within the last 10 years: No If all of the above answers are "NO", then may proceed with Cephalosporin use.     No family history on file.   Prior to Admission medications   Medication Sig Start Date End Date Taking? Authorizing Provider  acetaminophen (TYLENOL) 500 MG tablet Take 1,000 mg by mouth every 8 (eight) hours as needed for mild pain or moderate pain. Not to exceed 3g/24h of tylenol from all sources.    [provider]  albuterol (PROVENTIL) (2.5 MG/3ML) 0.083% nebulizer solution Take 3 mLs (2.5 mg total) by nebulization every 4 (four) hours as needed for wheezing or shortness of breath. 12/11/18   Swayze, Ava, DO  albuterol (VENTOLIN HFA) 108 (90 Base) MCG/ACT inhaler Inhale 2 puffs into the lungs every 6 (six) hours as needed for wheezing or shortness of breath.     [provider]  Alum & Mag Hydroxide-Simeth (ANTACID LIQUID PO) Take 30 mLs by mouth every 6 (six) hours as needed (heartburn).    [provider]  ascorbic acid (VITAMIN C) 1000 MG tablet Take 1 tablet (1,000 mg total) by mouth daily. 09/17/19   Lorella Nimrod, MD  aspirin EC 81 MG EC tablet Take 1 tablet (81 mg total) by mouth daily. Patient not taking: Reported on 01/24/2020 10/26/19   Swayze, Ava, DO  buPROPion (WELLBUTRIN XL) 150 MG 24 hr tablet Take 150 mg by mouth daily.    [provider]  Calcium Carb-Cholecalciferol 600-400 MG-UNIT TABS Take 1 tablet by mouth daily.    [provider]  clonazePAM (KLONOPIN) 1 MG tablet Take 0.5 tablets (0.5 mg total) by mouth 2 (two) times daily. 10/25/19   Swayze, Ava, DO  divalproex (DEPAKOTE ER) 250 MG 24 hr tablet Take 250 mg by mouth 2 (two) times daily.    [provider]  docusate sodium (COLACE) 100 MG capsule  Take 100 mg by mouth 2 (two) times daily.     [provider]  feeding supplement, ENSURE ENLIVE, (ENSURE ENLIVE) LIQD Take 237 mLs by mouth daily. Patient not taking: Reported on 01/24/2020 03/14/19   Oretha Milch D, MD  fluticasone furoate-vilanterol (BREO ELLIPTA) 100-25 MCG/INH AEPB Inhale 1 puff into the lungs daily.    [provider]  furosemide (LASIX) 20 MG tablet Take 1 tablet (20 mg total) by mouth 2 (two) times daily. 02/02/20   Alma Friendly, MD  guaiFENesin (ROBITUSSIN) 100 MG/5ML SOLN Take 15 mLs by mouth every 4 (four) hours as needed for cough or to loosen phlegm.    [provider]  hydrOXYzine (ATARAX/VISTARIL) 25 MG tablet Take 25 mg by mouth every 6 (six) hours as needed for anxiety or itching.    [provider]  ipratropium-albuterol (DUONEB) 0.5-2.5 (3) MG/3ML SOLN Take 3 mLs by nebulization 3 (three) times daily. 10/25/19   Swayze, Ava, DO  loratadine (CLARITIN) 10 MG tablet Take 10 mg by mouth daily.    [provider]    lurasidone (LATUDA) 20 MG TABS tablet Take 1 tablet (20 mg total) by mouth 2 (two) times daily. 05/15/19   Hongalgi, Lenis Dickinson, MD  magnesium hydroxide (MILK OF MAGNESIA) 400 MG/5ML suspension Take 30 mLs by mouth daily as needed for mild constipation or moderate constipation. 10/25/19   Swayze, Ava, DO  Melatonin 5 MG TABS Take 5 mg by mouth at bedtime.    [provider]  metoprolol succinate (TOPROL-XL) 25 MG 24 hr tablet Take 0.5 tablets (12.5 mg total) by mouth daily. 10/26/19   Swayze, Ava, DO  Multiple Vitamin (MULTIVITAMIN WITH MINERALS) TABS tablet Take 1 tablet by mouth daily. 03/15/19   Desiree Hane, MD  neomycin-bacitracin-polymyxin (NEOSPORIN) ointment Apply 1 application topically as needed for wound care.    [provider]  nicotine (NICODERM CQ - DOSED IN MG/24 HOURS) 21 mg/24hr patch Place 21 mg onto the skin daily.     [provider]  NIFEdipine (ADALAT CC) 60 MG 24 hr tablet Take 1 tablet (60 mg total) by mouth daily. 10/26/19   Swayze, Ava, DO  nystatin (NYSTATIN) powder Apply 1 application topically 2 (two) times daily as needed (irritation). Apply topically to reddened area twice daily as needed for irritation    [provider]  OLANZapine (ZYPREXA) 10 MG tablet Take 10 mg by mouth 2 (two) times daily.    [provider]  ondansetron (ZOFRAN) 8 MG tablet Take 8 mg by mouth every 6 (six) hours as needed for nausea or vomiting.    [provider]  pantoprazole (PROTONIX) 40 MG tablet Take 1 tablet (40 mg total) by mouth daily. 07/16/19 01/24/20  Mercy Riding, MD  polyethylene glycol (MIRALAX) 17 g packet Take 17 g by mouth daily. Patient taking differently: Take 17 g by mouth daily. Mix 1 capful (17 g) in 4-8 oz of water or juice and give by mouth once daily for bowel regimen 12/11/18   Swayze, Ava, DO  sodium chloride (OCEAN) 0.65 % SOLN nasal spray Place 2 sprays into both nostrils every 2 (two) hours as needed (dryness,  irritation).    [provider]  sucralfate (CARAFATE) 1 GM/10ML suspension Take 10 mLs (1 g total) by mouth 4 (four) times daily -  with meals and at bedtime. 07/16/19   Mercy Riding, MD    Physical Exam: Vitals:   02/13/20 1330 02/13/20 1400 02/13/20  1430 02/13/20 1530  BP: (!) 215/94 (!) 216/84 (!) 218/89 (!) 163/73  Pulse: 94 82 91 73  Resp: (!) 26 (!) 23 20 18   Temp:      TempSrc:      SpO2: 95% 94% 95% 93%  Weight:      Height:         Vitals:   02/13/20 1330 02/13/20 1400 02/13/20 1430 02/13/20 1530  BP: (!) 215/94 (!) 216/84 (!) 218/89 (!) 163/73  Pulse: 94 82 91 73  Resp: (!) 26 (!) 23 20 18   Temp:      TempSrc:      SpO2: 95% 94% 95% 93%  Weight:      Height:        Constitutional: NAD, alert and oriented x 3 Eyes: PERRL, lids and conjunctivae normal ENMT: Mucous membranes are moist.  Neck: normal, supple, no masses, no thyromegaly Respiratory: Crackles at the bases, scattered wheezing, no crackles.  Increased work of breathing. No accessory muscle use.  Cardiovascular: Regular rate and rhythm, no murmurs / rubs / gallops. No extremity edema. 2+ pedal pulses. No carotid bruits.  Abdomen: no tenderness, no masses palpated. No hepatosplenomegaly. Bowel sounds positive.  Increased abdominal girth, central adiposity Musculoskeletal: no clubbing / cyanosis. No joint deformity upper and lower extremities.  Skin: no rashes, lesions, ulcers.  Neurologic: No gross focal neurologic deficit.  Generalized weakness Psychiatric: Flat affect.   Labs on Admission: I have personally reviewed following labs and imaging studies  CBC: Recent Labs  Lab 02/13/20 0744  WBC 9.0  HGB 10.9*  HCT 33.8*  MCV 89.2  PLT 161   Basic Metabolic Panel: Recent Labs  Lab 02/13/20 0744  NA 128*  K 4.3  CL 84*  CO2 33*  GLUCOSE 113*  BUN 13  CREATININE 0.71  CALCIUM 9.0   GFR: Estimated Creatinine Clearance: 89.7 mL/min (by C-G formula based on SCr of 0.71  mg/dL). Liver Function Tests: No results for input(s): AST, ALT, ALKPHOS, BILITOT, PROT, ALBUMIN in the last 168 hours. Recent Labs  Lab 02/13/20 0744  LIPASE 27   No results for input(s): AMMONIA in the last 168 hours. Coagulation Profile: No results for input(s): INR, PROTIME in the last 168 hours. Cardiac Enzymes: No results for input(s): CKTOTAL, CKMB, CKMBINDEX, TROPONINI in the last 168 hours. BNP (last 3 results) No results for input(s): PROBNP in the last 8760 hours. HbA1C: No results for input(s): HGBA1C in the last 72 hours. CBG: No results for input(s): GLUCAP in the last 168 hours. Lipid Profile: No results for input(s): CHOL, HDL, LDLCALC, TRIG, CHOLHDL, LDLDIRECT in the last 72 hours. Thyroid Function Tests: No results for input(s): TSH, T4TOTAL, FREET4, T3FREE, THYROIDAB in the last 72 hours. Anemia Panel: No results for input(s): VITAMINB12, FOLATE, FERRITIN, TIBC, IRON, RETICCTPCT in the last 72 hours. Urine analysis:    Component Value Date/Time   COLORURINE COLORLESS (A) 01/24/2020 1920   APPEARANCEUR CLEAR (A) 01/24/2020 1920   APPEARANCEUR Clear 06/22/2012 0618   LABSPEC 1.003 (L) 01/24/2020 1920   LABSPEC 1.003 06/22/2012 0618   PHURINE 8.0 01/24/2020 1920   GLUCOSEU NEGATIVE 01/24/2020 1920   GLUCOSEU Negative 06/22/2012 0618   GLUCOSEU NEGATIVE 04/09/2011 1058   HGBUR NEGATIVE 01/24/2020 1920   BILIRUBINUR NEGATIVE 01/24/2020 1920   BILIRUBINUR Negative 06/22/2012 0618   KETONESUR NEGATIVE 01/24/2020 1920   PROTEINUR NEGATIVE 01/24/2020 1920   UROBILINOGEN 1.0 08/18/2011 1805   NITRITE NEGATIVE 01/24/2020 1920   LEUKOCYTESUR TRACE (A) 01/24/2020  1920   LEUKOCYTESUR Trace 06/22/2012 0618    Radiological Exams on Admission: DG Chest 2 View  Result Date: 02/13/2020 CLINICAL DATA:  Shortness of breath. EXAM: CHEST - 2 VIEW COMPARISON:  January 30, 2020 FINDINGS: Stable cardiomegaly. The hila and mediastinum are unchanged. No pneumothorax. Small  bilateral pleural effusions with underlying atelectasis. Mild pulmonary venous congestion not excluded. No overt edema. IMPRESSION: 1. Cardiomegaly and small bilateral pleural effusions. Mild pulmonary venous congestion, particularly on the left, not excluded. No overt edema. 2. No other acute abnormalities. Electronically Signed   By: Dorise Bullion III M.D   On: 02/13/2020 09:38    EKG: Independently reviewed.  Sinus rhythm with PACs   Assessment/Plan Principal Problem:   Acute on chronic diastolic CHF (congestive heart failure) ([facility]) Active Problems:   DM (diabetes mellitus), type 2 ([facility])   Chronic hyponatremia   Schizoaffective disorder, bipolar type ([place])   Anxiety and depression   Acute on chronic respiratory failure ([place])     1.  Acute on chronic diastolic dysfunction CHF Patient presents for evaluation of worsening shortness of breath from her baseline and states that her pulse oximetry at rest on room air was in the 60s. Her last known LVEF was 50 to 55% She had an increased oxygen requirement due to work of breathing and was placed on 6 L of oxygen via nasal cannula to maintain pulse oximetry greater than 92% Chest x-ray shows bilateral pleural effusions We will place patient on Lasix 40 mg IV every 12 We will wean oxygen down to patient's baseline of 3 L Optimize blood pressure control Maintain low-sodium diet    2.  Acute on chronic respiratory failure Most likely secondary to acute diastolic dysfunction CHF At baseline patient wears 3 L of oxygen but due to her increased work of breathing and hypoxemia despite being on 3 L she was placed on 6 L Following diuresis as well as bronchodilator therapy patient has been weaned back down to 4 L of oxygen   3. Hyponatremia  Chronic and stable Appears to be multifactorial and may be secondary to patient's antipsychotics, volume overload from CHF as well as psychogenic polydipsia Continue diuretics and place patient on a  fluid restriction Repeat sodium levels in a.m.   4. COPD; chronic hypoxic respiratory failure  Patient with a cough productive of clear phlegm as well as diffuse wheezes on exam Continue scheduled and as needed bronchodilator therapy Continue inhaled steroids   5. Schizoaffective disorder  Continue home regimen with Zyprexa, Depakote, Latuda, Wellbutrin, Klonopin     6. Hypertension  Poorly controlled Continue Metoprolol and Nifedipine   7. Nicotine Dependence Smoking cessation has been discussed with patient in detail Continue nicotine transdermal patch 21 mg daily     DVT prophylaxis: Lovenox Code Status: Full code Family Communication: Greater than 50% of time was spent discussing patient's condition and plan of care with her in detail.  She verbalizes understanding and agrees with the plan. Disposition Plan: Back to previous home environment Consults called: None    Clora Ohmer MD [facility]     02/13/2020, 3:58 PM

## 2020-02-13 NOTE — Progress Notes (Addendum)
° ° °  BRIEF OVERNIGHT PROGRESS REPORT  SUBJECTIVE: Per patient's RN patient c/o "difficulty breathing. SPO2 is 97% on 4L and RR is 24. She sounds wet to me and her BP is 226/87"  OBJECTIVE:On assessment,  he was afebrile with blood pressure 226/87 mm Hg and pulse rate 81 beats/min. There were no focal neurological deficits; he was alert and oriented x4, and he did not demonstrate any memory deficits.   ASSESSMENT AND PLAN:  Acute Hypoxic Hypercapnic Respiratory Failure secondary to Acute Decompensated HFpEF &  COPD with evidence of acute exacerbation - Supplemental O2 as needed to maintain O2 saturations 88 to 92% - May need BiPAP - High risk for intubation - Follow intermittent ABG and chest x-ray as needed - Repeat CXR on 8/21 with stable vascular congestion; no new focal infiltrate or hyperinflation noted;  - IV Lasix as blood pressure and renal function permits; currently on Lasix 40 mg IV BID - As needed bronchodilators   Acute on chronic HFpEF (last known EF 50 to 55%) -Hypertension Hx: HLD  - Continuous cardiac monitoring  -Maintain MAP greater than 65 - IV Lasix 40 mg IV BID - Continue Nifedipine and metoprolol. - Repeat 2D Echocardiogram  Hypertensive emergency - severe elevations in blood pressure (systolic blood pressure [SBP] greater than 200 mm Hg with target organ dysfunction - Patient also on steroids and anxious which may contribute to BP elevation - PRN hydralazine or IV labetalol - Continue Nifedipine and Metoprolol - If no improvement with PRNs as above will consider starting Nicardipine gtt  Elevated troponin -likely due to demand ischemia from CHF exacerbation and  hypertensive crisis  - Will continue to trend troponins     Rufina Falco, BSN, MSN, DNP, CCRN,FNP-C  [facility] Nurse Practitioner  [facility]

## 2020-02-13 NOTE — ED Notes (Signed)
Dr. Francine Graven informed that pts blood pressure still high. Verbal order given for hydralazine 10 mg IV x 1

## 2020-02-13 NOTE — ED Notes (Signed)
Attending MD at bedside with pt.

## 2020-02-13 NOTE — ED Triage Notes (Signed)
Pt arrives via ems from [facility], states that for the past 4 days she has been feeling bad and it has gotten worse, states that she has been having sob, chest pain and some abd pain, pt states that she thinks fluid has increased around her heart again, pt reports that her O2 has been increased to 4L

## 2020-02-13 NOTE — ED Notes (Signed)
MD aware of pt BP  

## 2020-02-13 NOTE — ED Notes (Signed)
Left message for [name], Legal guardian to call back

## 2020-02-13 NOTE — ED Notes (Signed)
Pt requesting med reconciliation for home meds. Pharm tech aware and has sent request to living facility. Pt informed.

## 2020-02-13 NOTE — ED Notes (Signed)
Pt is requesting IV morphine for pain. Per Dr. Francine Graven, pt cannot have IV morphine for pain at this time.

## 2020-02-13 NOTE — ED Notes (Signed)
ABG collected by RT

## 2020-02-13 NOTE — ED Provider Notes (Signed)
[facility] Emergency Department Provider Note   ____________________________________________    I have reviewed the triage vital signs and the nursing notes.   HISTORY  Chief Complaint  Shortness of breath chest tightness   HPI Natalie Thomas is a 59 y.o. female who presents with primary complaint of shortness of breath.  Patient presents from [place], reports over the last several days she has been having worsening shortness of breath.  She is typically on 3 L nasal cannula, has had to increase to 4 L but still feels quite short of breath with increased work of breathing.  Denies fevers chills or cough.  Reports a history of COPD and CHF, she is concerned that she may have pulmonary edema again as this feels similar but she is also wheezing.   Past Medical History:  Diagnosis Date  . Anemia 10/16/2017  . Anxiety and depression   . CHF (congestive heart failure) ([place])   . Chronic hyponatremia 04/08/2007   Qualifier: Diagnosis of  By: Marca Ancona RMA, Lucy    . Closed comminuted intertrochanteric fracture of right femur ([place]) 11/07/2017  . COPD 04/08/2007   Qualifier: Diagnosis of  By: Marca Ancona RMA, Lucy    . Depression   . Diabetes mellitus, type 2 ([place])    pt denies but states that she has been treated for DM  . Essential hypertension 04/08/2007   Qualifier: Diagnosis of  By: Marca Ancona RMA, Lucy    . GERD (gastroesophageal reflux disease)   . Hyperlipidemia 10/07/2017  . Lumbar disc disease 04/09/2011  . MENOPAUSE, PREMATURE 04/08/2007   Qualifier: Diagnosis of  By: Marca Ancona RMA, Lucy    . 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 Osteoporosis 04/08/2007   Qualifier: Diagnosis of  By: Reatha Armour, Lucy    . Polycythemia secondary to smoking 04/09/2011  . 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 Schizoaffective disorder, bipolar type ([place]) 07/05/2011    Patient Active Problem List   Diagnosis Date  Noted  . Acute CHF (congestive heart failure) ([address]) 02/13/2020  . Acute respiratory failure ([place]) 10/18/2019  . DNR (do not resuscitate) discussion   . Goals of care, counseling/discussion   . Palliative care by specialist   . Acute on chronic respiratory failure ([place]) 10/01/2019  . Chronic diastolic CHF (congestive heart failure) ([place])   . Shortness of breath 09/22/2019  . Acute on chronic respiratory failure with hypercapnia ([place]) 09/13/2019  . Leukocytosis 09/13/2019  . Class 2 obesity with body mass index (BMI) of 39.0 to 39.9 in adult   . Acute on chronic respiratory failure with hypoxemia ([place]) 09/04/2019  . COVID-19 virus infection   . 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 Nausea   . Acute gastritis   . Abdominal pain, chronic, epigastric   . Hyponatremia 07/09/2019  . Ventral hernia 07/09/2019  . Hypertensive crisis 05/01/2019  . Acute abdominal pain 03/15/2019  . Pleural effusion 03/13/2019  . Atelectasis 03/13/2019  . Acute on chronic combined systolic and diastolic congestive heart failure ([place]) 03/13/2019  . Type 2 diabetes mellitus without complication ([place]) 94/80/1655  . Urinary tract bacterial infections 03/09/2019  . Acute cystitis without hematuria   . Constipation   . Acute hypoxemic respiratory failure ([place]) 12/03/2018  . Irritability 11/09/2018  . Aggressive behavior 11/09/2018  . Agitation   . S/P right hip fracture 11/07/2017  . Closed comminuted intertrochanteric fracture of right femur ([place]) 11/07/2017  . Anxiety and  depression   . Dyspnea 10/16/2017  . Anemia 10/16/2017  . COPD exacerbation ([place]) 10/07/2017  . Hyperlipidemia 10/07/2017  . Chronic respiratory failure with hypoxia ([place]) 10/07/2017  . Acute on chronic respiratory failure with hypoxia and hypercapnia ([place]) 10/07/2017  . Incontinence of urine 08/14/2011  . Schizoaffective disorder, bipolar type ([place]) 07/05/2011  . Cough 05/08/2011  . Lumbar disc disease 04/09/2011  . Polycythemia  secondary to smoking 04/09/2011  . HIP PAIN, LEFT 12/12/2007  . 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 DM (diabetes mellitus), type 2 ([place]) 04/08/2007  . MENOPAUSE, PREMATURE 04/08/2007  . Chronic hyponatremia 04/08/2007  . Schizoaffective disorder ([place]) 04/08/2007  . Essential hypertension 04/08/2007  . COPD (chronic obstructive pulmonary disease) ([place]) 04/08/2007  . GERD 04/08/2007  . Osteoporosis 04/08/2007    Past Surgical History:  Procedure Laterality Date  . BIOPSY  07/11/2019   Procedure: BIOPSY;  Surgeon: Jerene Bears, MD;  Location: [facility] ENDOSCOPY;  Service: Gastroenterology;;  . ESOPHAGOGASTRODUODENOSCOPY (EGD) WITH PROPOFOL N/A 07/11/2019   Procedure: ESOPHAGOGASTRODUODENOSCOPY (EGD) WITH PROPOFOL;  Surgeon: Jerene Bears, MD;  Location: [facility];  Service: Gastroenterology;  Laterality: N/A;  . EYE SURGERY     on left  eye, pt states that she sees bad out of the right eye and needs surgery there  . INTRAMEDULLARY (IM) NAIL INTERTROCHANTERIC Right 11/13/2017   Procedure: INTRAMEDULLARY (IM) NAIL INTERTROCHANTRIC;  Surgeon: Shona Needles, MD;  Location: [facility];  Service: Orthopedics;  Laterality: Right;  . PELVIC FRACTURE SURGERY      Prior to Admission medications   Medication Sig Start Date End Date Taking? Authorizing Provider  acetaminophen (TYLENOL) 500 MG tablet Take 1,000 mg by mouth every 8 (eight) hours as needed for mild pain or moderate pain. Not to exceed 3g/24h of tylenol from all sources.    [provider]  albuterol (PROVENTIL) (2.5 MG/3ML) 0.083% nebulizer solution Take 3 mLs (2.5 mg total) by nebulization every 4 (four) hours as needed for wheezing or shortness of breath. 12/11/18   Swayze, Ava, DO  albuterol (VENTOLIN HFA) 108 (90 Base) MCG/ACT inhaler Inhale 2 puffs into the lungs every 6 (six) hours as needed for wheezing or shortness of breath.    [provider]  Alum & Mag Hydroxide-Simeth (ANTACID LIQUID PO) Take 30 mLs by mouth  every 6 (six) hours as needed (heartburn).    [provider]  ascorbic acid (VITAMIN C) 1000 MG tablet Take 1 tablet (1,000 mg total) by mouth daily. 09/17/19   Lorella Nimrod, MD  aspirin EC 81 MG EC tablet Take 1 tablet (81 mg total) by mouth daily. Patient not taking: Reported on 01/24/2020 10/26/19   Swayze, Ava, DO  buPROPion (WELLBUTRIN XL) 150 MG 24 hr tablet Take 150 mg by mouth daily.    [provider]  Calcium Carb-Cholecalciferol 600-400 MG-UNIT TABS Take 1 tablet by mouth daily.    [provider]  clonazePAM (KLONOPIN) 1 MG tablet Take 0.5 tablets (0.5 mg total) by mouth 2 (two) times daily. 10/25/19   Swayze, Ava, DO  divalproex (DEPAKOTE ER) 250 MG 24 hr tablet Take 250 mg by mouth 2 (two) times daily.    [provider]  docusate sodium (COLACE) 100 MG capsule Take 100 mg by mouth 2 (two) times daily.     [provider]  feeding supplement, ENSURE ENLIVE, (ENSURE ENLIVE) LIQD Take 237 mLs by mouth daily. Patient not taking: Reported on 01/24/2020 03/14/19   Desiree Hane, MD  fluticasone furoate-vilanterol (BREO ELLIPTA) 100-25 MCG/INH AEPB Inhale 1 puff into the lungs daily.    [provider]  furosemide (LASIX) 20 MG tablet Take 1 tablet (20 mg total) by mouth 2 (two) times daily. 02/02/20   Alma Friendly, MD  guaiFENesin (ROBITUSSIN) 100 MG/5ML SOLN Take 15 mLs by mouth every 4 (four) hours as needed for cough or to loosen phlegm.    [provider]  hydrOXYzine (ATARAX/VISTARIL) 25 MG tablet Take 25 mg by mouth every 6 (six) hours as needed for anxiety or itching.    [provider]  ipratropium-albuterol (DUONEB) 0.5-2.5 (3) MG/3ML SOLN Take 3 mLs by nebulization 3 (three) times daily. 10/25/19   Swayze, Ava, DO  loratadine (CLARITIN) 10 MG tablet Take 10 mg by mouth daily.    [provider]  lurasidone (LATUDA) 20 MG TABS tablet Take 1 tablet (20 mg total) by mouth 2 (two) times daily. 05/15/19    Hongalgi, Lenis Dickinson, MD  magnesium hydroxide (MILK OF MAGNESIA) 400 MG/5ML suspension Take 30 mLs by mouth daily as needed for mild constipation or moderate constipation. 10/25/19   Swayze, Ava, DO  Melatonin 5 MG TABS Take 5 mg by mouth at bedtime.    [provider]  metoprolol succinate (TOPROL-XL) 25 MG 24 hr tablet Take 0.5 tablets (12.5 mg total) by mouth daily. 10/26/19   Swayze, Ava, DO  Multiple Vitamin (MULTIVITAMIN WITH MINERALS) TABS tablet Take 1 tablet by mouth daily. 03/15/19   Desiree Hane, MD  neomycin-bacitracin-polymyxin (NEOSPORIN) ointment Apply 1 application topically as needed for wound care.    [provider]  nicotine (NICODERM CQ - DOSED IN MG/24 HOURS) 21 mg/24hr patch Place 21 mg onto the skin daily.     [provider]  NIFEdipine (ADALAT CC) 60 MG 24 hr tablet Take 1 tablet (60 mg total) by mouth daily. 10/26/19   Swayze, Ava, DO  nystatin (NYSTATIN) powder Apply 1 application topically 2 (two) times daily as needed (irritation). Apply topically to reddened area twice daily as needed for irritation    [provider]  OLANZapine (ZYPREXA) 10 MG tablet Take 10 mg by mouth 2 (two) times daily.    [provider]  ondansetron (ZOFRAN) 8 MG tablet Take 8 mg by mouth every 6 (six) hours as needed for nausea or vomiting.    [provider]  pantoprazole (PROTONIX) 40 MG tablet Take 1 tablet (40 mg total) by mouth daily. 07/16/19 01/24/20  Mercy Riding, MD  polyethylene glycol (MIRALAX) 17 g packet Take 17 g by mouth daily. Patient taking differently: Take 17 g by mouth daily. Mix 1 capful (17 g) in 4-8 oz of water or juice and give by mouth once daily for bowel regimen 12/11/18   Swayze, Ava, DO  sodium chloride (OCEAN) 0.65 % SOLN nasal spray Place 2 sprays into both nostrils every 2 (two) hours as needed (dryness, irritation).    [provider]  sucralfate (CARAFATE) 1 GM/10ML suspension Take 10 mLs (1 g total) by mouth  4 (four) times daily -  with meals and at bedtime. 07/16/19   Mercy Riding, MD     Allergies Clarithromycin and Penicillins  No family history on file.  Social History Social History   Tobacco Use  . Smoking status: Former Smoker    Packs/day: 0.25    Types: Cigarettes    Quit date: 07/23/2019    Years since quitting: 0.5  . Smokeless tobacco: Never Used  Vaping Use  . Vaping Use: Never used  Substance Use Topics  . Alcohol use: No  . Drug use: No    Review of Systems  Constitutional: No fever/chills Eyes: No visual changes.  ENT: No sore throat. Cardiovascular: As above Respiratory: As above Gastrointestinal: No abdominal pain.   Genitourinary: Negative for dysuria. Musculoskeletal: Negative for back pain. Skin: Negative for rash. Neurological: Negative for headaches    ____________________________________________   PHYSICAL EXAM:  VITAL SIGNS: ED Triage Vitals  Enc Vitals Group     BP 02/13/20 0738 132/87     Pulse Rate 02/13/20 0738 84     Resp 02/13/20 0738 18     Temp 02/13/20 0738 98.9 F (37.2 C)     Temp Source 02/13/20 0738 Oral     SpO2 02/13/20 0738 99 %     Weight 02/13/20 0740 99.8 kg (220 lb)     Height 02/13/20 0740 1.651 m (5\' 5" )     Head Circumference --      Peak Flow --      Pain Score 02/13/20 0757 7     Pain Loc --      Pain Edu? --      Excl. in [place]? --     Constitutional: Alert and oriented.  Ill-appearing Eyes: Conjunctivae are normal.   Nose: No congestion/rhinnorhea. Mouth/Throat: Mucous membranes are moist.    Cardiovascular: Tachycardia, regular rhythm. Grossly normal heart sounds.  Good peripheral circulation. Respiratory: Increased respiratory effort with tachypnea upon being brought back to the emergency department room, poor airflow, scattered wheezes, bibasilar rales Gastrointestinal: Soft and nontender. No distention.  No CVA tenderness.  Musculoskeletal:Warm and well perfused Neurologic:  Normal speech and  language. No gross focal neurologic deficits are appreciated.  Skin:  Skin is warm, dry and intact. No rash noted. Psychiatric: Mood and affect are normal. Speech and behavior are normal.  ____________________________________________   LABS (all labs ordered are listed, but only abnormal results are displayed)  Labs Reviewed  BASIC METABOLIC PANEL - Abnormal; Notable for the following components:      Result Value   Sodium 128 (*)    Chloride 84 (*)    CO2 33 (*)    Glucose, Bld 113 (*)    All other components within normal limits  CBC - Abnormal; Notable for the following components:   RBC 3.79 (*)    Hemoglobin 10.9 (*)    HCT 33.8 (*)    All other components within normal limits  BRAIN NATRIURETIC PEPTIDE - Abnormal; Notable for the following components:   B Natriuretic Peptide 732.2 (*)    All other components within normal limits  TROPONIN I (HIGH SENSITIVITY) - Abnormal; Notable for the following components:   Troponin I (High Sensitivity) 22 (*)    All other components within normal limits  TROPONIN I (HIGH SENSITIVITY) - Abnormal; Notable for the following components:   Troponin I (High Sensitivity) 25 (*)    All other components within normal limits  SARS CORONAVIRUS 2 BY RT PCR (HOSPITAL ORDER, [facility])  LIPASE, BLOOD  BLOOD GAS, VENOUS   ____________________________________________  EKG  ED ECG REPORT I, Lavonia Drafts, the attending physician, personally viewed and interpreted this ECG.  Date: 02/13/2020  Rhythm: normal sinus rhythm QRS Axis: normal Intervals: normal ST/T Wave abnormalities: normal Narrative Interpretation: no evidence of acute ischemia  ____________________________________________  RADIOLOGY  Chest x-ray reviewed by me, pulmonary vascular congestion, cardiomegaly ____________________________________________   PROCEDURES  Procedure(s) performed: No  Procedures   Critical Care performed:  yes  CRITICAL CARE Performed by: Lavonia Drafts   Total critical care time: 30  minutes  Critical care time was exclusive of separately billable procedures and treating other patients.  Critical care was necessary to treat or prevent imminent or life-threatening deterioration.  Critical care was time spent personally by me on the following activities: development of treatment plan with patient and/or surrogate as well as nursing, discussions with consultants, evaluation of patient's response to treatment, examination of patient, obtaining history from patient or surrogate, ordering and performing treatments and interventions, ordering and review of laboratory studies, ordering and review of radiographic studies, pulse oximetry and re-evaluation of patient's condition.  ____________________________________________   INITIAL IMPRESSION / ASSESSMENT AND PLAN / ED COURSE  Pertinent labs & imaging results that were available during my care of the patient were reviewed by me and considered in my medical decision making (see chart for details).  Patient presents with shortness of breath with increased oxygen requirement, wheezing on exam but also possible rails.  Needed to increase her oxygen to 6 L to prevent hypoxia.  Suspicious for COPD exacerbation versus CHF/pulmonary edema or combination of both.  No fevers or chills to suggest pneumonia or COVID-19 however this is also on the differential.  Duo nebs ordered with IV Solu-Medrol given significant wheezing component on exam.  Chest x-ray reviewed which demonstrates some pulmonary venous congestion as well as pleural effusions.  IV Lasix added.  White blood cell count is normal.  BNP is elevated, Troponin is mildly elevated likely related to increased work of breathing/strain  Mild hyponatremia noted.  Pending VBG.  ----------------------------------------- 2:07 PM on 02/13/2020 -----------------------------------------  Patient  reevaluated, breathing is better able to back her down to 4 L nasal cannula ----------------------------------------- 2:48 PM on 02/13/2020 -----------------------------------------   Notified of elevated CO2 on VBG, patient continues to improve clinically   ____________________________________________   FINAL CLINICAL IMPRESSION(S) / ED DIAGNOSES  Final diagnoses:  COPD exacerbation ([place])  Shortness of breath  Acute on chronic congestive heart failure, unspecified heart failure type ([place])  Acute on chronic respiratory failure with hypoxia and hypercapnia ([place])        Note:  This document was prepared using Dragon voice recognition software and may include unintentional dictation errors.   Lavonia Drafts, MD 02/13/20 [phone]

## 2020-02-13 NOTE — ED Notes (Signed)
Pt resting in bed. States pain still 7/10 "all over body". Med reconciliation in process.

## 2020-02-14 ENCOUNTER — Encounter: Payer: Self-pay | Admitting: Internal Medicine

## 2020-02-14 LAB — BLOOD GAS, VENOUS
Acid-Base Excess: 19.6 mmol/L — ABNORMAL HIGH (ref 0.0–2.0)
Bicarbonate: 46.9 mmol/L — ABNORMAL HIGH (ref 20.0–28.0)
O2 Saturation: 83.4 %
Patient temperature: 37
pCO2, Ven: 69 mmHg — ABNORMAL HIGH (ref 44.0–60.0)
pH, Ven: 7.44 — ABNORMAL HIGH (ref 7.250–7.430)
pO2, Ven: 46 mmHg — ABNORMAL HIGH (ref 32.0–45.0)

## 2020-02-14 LAB — BASIC METABOLIC PANEL
Anion gap: 9 (ref 5–15)
BUN: 23 mg/dL — ABNORMAL HIGH (ref 6–20)
CO2: 34 mmol/L — ABNORMAL HIGH (ref 22–32)
Calcium: 8.7 mg/dL — ABNORMAL LOW (ref 8.9–10.3)
Chloride: 84 mmol/L — ABNORMAL LOW (ref 98–111)
Creatinine, Ser: 0.8 mg/dL (ref 0.44–1.00)
GFR calc Af Amer: >60 mL/min (ref >60)
GFR calc non Af Amer: >60 mL/min (ref >60)
Glucose, Bld: 174 mg/dL — ABNORMAL HIGH (ref 70–99)
Potassium: 4.2 mmol/L (ref 3.5–5.1)
Sodium: 127 mmol/L — ABNORMAL LOW (ref 135–145)

## 2020-02-14 LAB — MRSA PCR SCREENING: MRSA by PCR: NEGATIVE

## 2020-02-14 MED ORDER — DIVALPROEX SODIUM 250 MG PO DR TAB
250.0000 mg | DELAYED_RELEASE_TABLET | Freq: Three times a day (TID) | ORAL | Status: DC
  Administered 2020-02-14 – 2020-02-19 (×15): 250 mg via ORAL
  Filled 2020-02-14 (×18): qty 1

## 2020-02-14 MED ORDER — LORAZEPAM 1 MG PO TABS
1.0000 mg | ORAL_TABLET | ORAL | Status: DC | PRN
  Administered 2020-02-14 – 2020-02-15 (×3): 1 mg via ORAL
  Filled 2020-02-14 (×3): qty 1

## 2020-02-14 MED ORDER — BUPROPION HCL ER (SR) 150 MG PO TB12: 150.0000 mg | ORAL_TABLET | Freq: Two times a day (BID) | ORAL | Status: DC

## 2020-02-14 MED ORDER — METHYLPREDNISOLONE SODIUM SUCC 40 MG IJ SOLR
40.0000 mg | Freq: Two times a day (BID) | INTRAMUSCULAR | Status: AC
  Administered 2020-02-14 – 2020-02-15 (×3): 40 mg via INTRAVENOUS
  Filled 2020-02-14 (×3): qty 1

## 2020-02-14 MED ORDER — ORAL CARE MOUTH RINSE
15.0000 mL | Freq: Two times a day (BID) | OROMUCOSAL | Status: DC
  Administered 2020-02-14 – 2020-02-19 (×11): 15 mL via OROMUCOSAL

## 2020-02-14 NOTE — Plan of Care (Signed)
  Problem: Education: Goal: Knowledge of General Education information will improve Description Including pain rating scale, medication(s)/side effects and non-pharmacologic comfort measures Outcome: Progressing   Problem: Health Behavior/Discharge Planning: Goal: Ability to manage health-related needs will improve Outcome: Progressing   

## 2020-02-14 NOTE — Plan of Care (Signed)
  Problem: Activity: Goal: Risk for activity intolerance will decrease Outcome: Progressing   Problem: Pain Managment: Goal: General experience of comfort will improve Outcome: Progressing   Problem: Safety: Goal: Ability to remain free from injury will improve Outcome: Progressing   Problem: Cardiac: Goal: Ability to achieve and maintain adequate cardiopulmonary perfusion will improve Outcome: Progressing   Problem: Clinical Measurements: Goal: Respiratory complications will improve Outcome: Not Progressing Note: Patient remains on 4L of oxygen. Her baseline is 3L [place]

## 2020-02-14 NOTE — Progress Notes (Signed)
Physical Therapy Evaluation Patient Details Name: Natalie Thomas MRN: [id number] DOB: [birth year] Today's Date: 02/14/2020   History of Present Illness  Per MD note: Natalie Thomas is a 59 y.o. female obese Caucasian femalewith a known history of  chronic diastolic CHF, schizophrenia, chronic hyponatremia, chronic respiratory failure on 3 L of oxygen, dyslipidemia and psychogenic polydipsia who was sent to the ER from the skilled nursing facility for evaluation of shortness of breath which she has had for the last 3 days.  Patient states that her symptoms have been worsening over the last 3 days associated with a cough productive of clear phlegm and wheezing.  She states that she checked her pulse ox on room air and it was in the 60s and so she notified the nursing home staff that she would like to be sent to the ER for evaluation.  She also complains of chest tightness that is nonradiating.  Upon arrival to the ER patient was noted to have an increased oxygen requirement and was on 6 L to maintain pulse oximetry greater than 92%.  She received a dose of Lasix and bronchodilator therapy and has been weaned down to 3 to 4 L to maintain sats greater than 92%  Clinical Impression  Patient agrees to PT evaluation. She reports pain in R hip. She has decreased strength BLE hip -3/5, 3/5 knee. She needs min assist for supine <> sit . She has good sitting balance with UE assist. She is not able to transfer sit to stand due to fatigue and weakness. She is on 4 L O2 with labored breathing. She performs BLE seated exercises including hip marching and LAQ x 20 . She will benefit from skilled PT to improve mobility and strength.     Follow Up Recommendations SNF    Equipment Recommendations  Rolling walker with 5" wheels    Recommendations for Other Services       Precautions / Restrictions Precautions Precautions: None Restrictions Weight Bearing Restrictions: No      Mobility  Bed  Mobility Overal bed mobility: Needs Assistance Bed Mobility: Supine to Sit;Sit to Supine     Supine to sit: Min assist Sit to supine: Min assist      Transfers Overall transfer level:  (unable due to BLE weakness / fatigue)                  Ambulation/Gait Ambulation/Gait assistance:  (unable)              Stairs            Wheelchair Mobility    Modified Rankin (Stroke Patients Only)       Balance Overall balance assessment: Needs assistance Sitting-balance support: No upper extremity supported;Feet supported Sitting balance-Leahy Scale: Good                                       Pertinent Vitals/Pain Pain Assessment: Faces Faces Pain Scale: Hurts a little bit    Home Living Family/patient expects to be discharged to:: Skilled nursing facility                      Prior Function Level of Independence: Needs assistance   Gait / Transfers Assistance Needed: pivots bed <> wc           Hand Dominance        Extremity/Trunk Assessment  Upper Extremity Assessment Upper Extremity Assessment: Overall WFL for tasks assessed    Lower Extremity Assessment Lower Extremity Assessment: RLE deficits/detail;LLE deficits/detail RLE Deficits / Details: -3/5 RLE, knee ext 3/5 LLE Deficits / Details: -3/5 RLE, knee ext 3/5       Communication   Communication: No difficulties  Cognition Arousal/Alertness: Awake/alert Behavior During Therapy: WFL for tasks assessed/performed                                          General Comments      Exercises     Assessment/Plan    PT Assessment Patient needs continued PT services  PT Problem List Decreased strength;Decreased activity tolerance;Decreased mobility       PT Treatment Interventions Gait training;Therapeutic activities;Therapeutic exercise;Balance training    PT Goals (Current goals can be found in the Care Plan section)  Acute Rehab PT  Goals Patient Stated Goal: to get stronger PT Goal Formulation: With patient Time For Goal Achievement: 02/28/20 Potential to Achieve Goals: Good    Frequency Min 2X/week   Barriers to discharge        Co-evaluation               AM-PAC PT "6 Clicks" Mobility  Outcome Measure Help needed turning from your back to your side while in a flat bed without using bedrails?: A Little Help needed moving from lying on your back to sitting on the side of a flat bed without using bedrails?: A Lot Help needed moving to and from a bed to a chair (including a wheelchair)?: A Lot Help needed standing up from a chair using your arms (e.g., wheelchair or bedside chair)?: Total Help needed to walk in hospital room?: Total Help needed climbing 3-5 steps with a railing? : Total 6 Click Score: 10    End of Session Equipment Utilized During Treatment: Gait belt;Oxygen Activity Tolerance: Patient limited by fatigue;Patient limited by lethargy Patient left: in bed;with bed alarm set Nurse Communication: Mobility status PT Visit Diagnosis: Unsteadiness on feet (R26.81);Muscle weakness (generalized) (M62.81);Difficulty in walking, not elsewhere classified (R26.2)    Time: 1345-1405 PT Time Calculation (min) (ACUTE ONLY): 20 min   Charges:   PT Evaluation $PT Eval Low Complexity: 1 Low PT Treatments $Therapeutic Exercise: 8-22 mins          Alanson Puls, PT DPT 02/14/2020, 2:41 PM

## 2020-02-14 NOTE — Progress Notes (Signed)
PROGRESS NOTE    Natalie Thomas  NWG:[id number] DOB: 01-23-1961 DOA: 02/13/2020 PCP: System, Pcp Not In   Brief Narrative:  Natalie Thomas is a 59 y.o. female obese Caucasian femalewith a known history of  chronic diastolic CHF, schizophrenia, chronic hyponatremia, chronic respiratory failure on 3 L of oxygen, dyslipidemia and psychogenic polydipsia who was sent to the ER from the skilled nursing facility for evaluation of shortness of breath which she has had for the last 3 days.  Patient states that her symptoms have been worsening over the last 3 days associated with a cough productive of clear phlegm and wheezing.  She states that she checked her pulse ox on room air and it was in the 60s and so she notified the nursing home staff that she would like to be sent to the ER for evaluation.  She also complains of chest tightness that is nonradiating.  Upon arrival to the ER patient was noted to have an increased oxygen requirement and was on 6 L to maintain pulse oximetry greater than 92%.  She received a dose of Lasix and bronchodilator therapy and has been weaned down to 3 to 4 L to maintain sats greater than 92% She denies having any nausea, no vomiting, no changes in her bowel habits, no urinary symptoms, no dizziness or lightheadedness. Vital signs show significant elevated blood pressure with systolic blood pressure above 266mmHg with respiratory rate of 26.  She is afebrile Labs reveal sodium of 128, potassium 4.3, bicarb 33, glucose 113, BUN 13, creatinine 0.71, BNP 732, troponin 22 >> 25, white count 9.0, hemoglobin 10.9 Chest x-ray reviewed by me shows cardiomegaly and small bilateral pleural effusions. Twelve-lead EKG reviewed by me shows sinus rhythm with PVCs. Patient is a 60 year old Caucasian female with multiple medical problems who resides in a skilled nursing facility and was sent to the ER for evaluation of shortness of breath.  Upon arrival to the ER she was noted to have  increased work of breathing as well as an increased oxygen requirement 3L >> 6L to maintain pulse oximetry greater than 92%.  She had diffuse wheezes and received bronchodilator therapy in the ER.  Chest x-ray shows CHF.  Patient will be admitted to the hospital for further evaluation.   Assessment & Plan:   Principal Problem:   Acute on chronic diastolic CHF (congestive heart failure) (HCC) Active Problems:   DM (diabetes mellitus), type 2 (HCC)   Chronic hyponatremia   Schizoaffective disorder, bipolar type (HCC)   Anxiety and depression   Acute on chronic respiratory failure (HCC)   Acute diastolic (congestive) heart failure ([place])  1.  Acute on chronic diastolic dysfunction CHF Patient presents for evaluation of worsening shortness of breath from her baseline and states that her pulse oximetry at rest on room air was in the 60s. Her last known LVEF was 50 to 55% She had an increased oxygen requirement due to work of breathing and was placed on 6 L of oxygen via nasal cannula to maintain pulse oximetry greater than 92% Chest x-ray shows bilateral pleural effusions We will place patient on Lasix 40 mg IV every 12 We will wean oxygen down to patient's baseline of 3 L Optimize blood pressure control Maintain low-sodium diet.    2. Acute on chronic respiratory failure Most likely secondary to acute diastolic dysfunction CHF At baseline patient wears 3 L of oxygen but due to her increased work of breathing and hypoxemia despite being on 3 L she  was placed on 6 L Following diuresis as well as bronchodilator therapy patient has been weaned back down to 4 L of oxygen   3.Hyponatremia Chronic and stable Appears to be multifactorial and may be secondary to patient's antipsychotics, volume overload from CHF as well as psychogenic polydipsia Continue diuretics and place patient on a fluid restriction Repeat sodium levels in a.m.   4.COPD; chronic hypoxic respiratory  failure Patient with a cough productive of clear phlegm as well as diffuse wheezes on exam Continue scheduled and as needed bronchodilator therapy Continue inhaled steroids.   5.Schizoaffective disorder Continue home regimen with Zyprexa, Depakote, Latuda, Wellbutrin, Klonopin  6.Hypertension Poorly controlled Continue Metoprolol and Nifedipine. Pain control and anxiety meds.    7. Nicotine Dependence Smoking cessation has been discussed with patient in detail Continue nicotine transdermal patch 21 mg daily.    DVT prophylaxis: Lovenox Code Status: Full code Family Communication: Greater than 50% of time was spent discussing patient's condition and plan of care with her in detail.  She verbalizes understanding and agrees with the plan. Disposition Plan: Back to previous home environment Consults called: None   Subjective: Pt seen today,   Objective: Vitals:   02/14/20 0400 02/14/20 0834 02/14/20 1143 02/14/20 1209  BP:  133/79 116/90   Pulse:  91 71   Resp:  19 18   Temp:  98.3 F (36.8 C)    TempSrc:  Oral    SpO2:  100% 98% 98%  Weight: 100.5 kg     Height: 5\' 5"  (1.651 m)       Intake/Output Summary (Last 24 hours) at 02/14/2020 1423 Last data filed at 02/14/2020 1300 Gross per 24 hour  Intake 963 ml  Output 2100 ml  Net -1137 ml   Filed Weights   02/13/20 0740 02/14/20 0400  Weight: 99.8 kg 100.5 kg    Examination: Blood pressure 116/90, pulse 71, temperature 98.3 F (36.8 C), temperature source Oral, resp. rate 18, height 5\' 5"  (1.651 m), weight 100.5 kg, SpO2 98 %. General exam: Appears calm and comfortable  Respiratory system: Clear to auscultation. Respiratory effort normal. Cardiovascular system: S1 & S2 heard, RRR. No JVD, murmurs, rubs, gallops or clicks. No pedal edema. Gastrointestinal system: Abdomen is nondistended, soft and nontender. No organomegaly or masses felt. Normal bowel sounds heard. Central nervous system: Alert  and oriented. No focal neurological deficits. Extremities: pt moving all her ext. Skin: No rashes, lesions or ulcers Psychiatry: Judgement and insight appear normal. Mood & affect appropriate.   Data Reviewed: I have personally reviewed following labs and imaging studies  CBC: Recent Labs  Lab 02/13/20 0744  WBC 9.0  HGB 10.9*  HCT 33.8*  MCV 89.2  PLT 188   Basic Metabolic Panel: Recent Labs  Lab 02/13/20 0744 02/14/20 0509  NA 128* 127*  K 4.3 4.2  CL 84* 84*  CO2 33* 34*  GLUCOSE 113* 174*  BUN 13 23*  CREATININE 0.71 0.80  CALCIUM 9.0 8.7*   GFR: Estimated Creatinine Clearance: 90 mL/min (by C-G formula based on SCr of 0.8 mg/dL).   Recent Labs  Lab 02/13/20 0744  LIPASE 27    Recent Results (from the past 240 hour(s))  SARS Coronavirus 2 by RT PCR (hospital order, performed in [facility] hospital lab) Nasopharyngeal Nasopharyngeal Swab     Status: None   Collection Time: 02/13/20  2:21 PM   Specimen: Nasopharyngeal Swab  Result Value Ref Range Status   SARS Coronavirus 2 NEGATIVE NEGATIVE Final  Comment: (NOTE) SARS-CoV-2 target nucleic acids are NOT DETECTED.  The SARS-CoV-2 RNA is generally detectable in upper and lower respiratory specimens during the acute phase of infection. The lowest concentration of SARS-CoV-2 viral copies this assay can detect is 250 copies / mL. A negative result does not preclude SARS-CoV-2 infection and should not be used as the sole basis for treatment or other patient management decisions.  A negative result may occur with improper specimen collection / handling, submission of specimen other than nasopharyngeal swab, presence of viral mutation(s) within the areas targeted by this assay, and inadequate number of viral copies (<250 copies / mL). A negative result must be combined with clinical observations, patient history, and epidemiological information.  Fact Sheet for Patients:    StrictlyIdeas.no  Fact Sheet for Healthcare Providers: BankingDealers.co.za  This test is not yet approved or  cleared by the [place] FDA and has been authorized for detection and/or diagnosis of SARS-CoV-2 by FDA under an Emergency Use Authorization (EUA).  This EUA will remain in effect (meaning this test can be used) for the duration of the COVID-19 declaration under Section 564(b)(1) of the Act, 21 U.S.C. section 360bbb-3(b)(1), unless the authorization is terminated or revoked sooner.  Performed at [facility], [address] [place], [place] [address]   MRSA PCR Screening     Status: None   Collection Time: 02/14/20  5:51 AM   Specimen: Nasopharyngeal  Result Value Ref Range Status   MRSA by PCR NEGATIVE NEGATIVE Final    Comment:        The GeneXpert MRSA Assay (FDA approved for NASAL specimens only), is one component of a comprehensive MRSA colonization surveillance program. It is not intended to diagnose MRSA infection nor to guide or monitor treatment for MRSA infections. Performed at [facility], [address] [place], [place] [address]      Radiology Studies: DG Chest 1 View  Result Date: 02/13/2020 CLINICAL DATA:  Short of breath, chest pain, abdominal pain, symptoms for 4 days EXAM: CHEST  1 VIEW COMPARISON:  02/13/2020 at 8:55 a.m. FINDINGS: Two frontal views of the chest demonstrates stable enlargement of the cardiac silhouette. Stable central vascular congestion without airspace disease. Trace bilateral effusions unchanged. No pneumothorax. No acute bony abnormalities. IMPRESSION: 1. Stable vascular congestion and trace bilateral pleural effusions. No overt edema. Electronically Signed   By: Randa Ngo M.D.   On: 02/13/2020 20:55   DG Chest 2 View  Result Date: 02/13/2020 CLINICAL DATA:  Shortness of breath. EXAM: CHEST - 2 VIEW COMPARISON:  January 30, 2020 FINDINGS: Stable  cardiomegaly. The hila and mediastinum are unchanged. No pneumothorax. Small bilateral pleural effusions with underlying atelectasis. Mild pulmonary venous congestion not excluded. No overt edema. IMPRESSION: 1. Cardiomegaly and small bilateral pleural effusions. Mild pulmonary venous congestion, particularly on the left, not excluded. No overt edema. 2. No other acute abnormalities. Electronically Signed   By: Dorise Bullion III M.D   On: 02/13/2020 09:38   Scheduled Meds: . ascorbic acid  1,000 mg Oral Daily  . aspirin EC  81 mg Oral Daily  . buPROPion  150 mg Oral BID  . buPROPion  150 mg Oral Daily  . calcium-vitamin D  1 tablet Oral Daily  . divalproex  250 mg Oral BID  . divalproex  250 mg Oral TID  . docusate sodium  100 mg Oral BID  . enoxaparin (LOVENOX) injection  40 mg Subcutaneous Q24H  . fluticasone furoate-vilanterol  1 puff  Inhalation Daily  . furosemide  40 mg Intravenous Q12H  . loratadine  10 mg Oral Daily  . lurasidone  20 mg Oral BID  . mouth rinse  15 mL Mouth Rinse BID  . melatonin  5 mg Oral QHS  . methylPREDNISolone (SOLU-MEDROL) injection  40 mg Intravenous Q12H  . metoprolol succinate  12.5 mg Oral Daily  . multivitamin with minerals  1 tablet Oral Daily  . nicotine  21 mg Transdermal Daily  . NIFEdipine  60 mg Oral Daily  . OLANZapine  10 mg Oral BID  . pantoprazole  40 mg Oral Daily  . sodium chloride flush  3 mL Intravenous Q12H  . sucralfate  1 g Oral TID WC & HS   Continuous Infusions: . sodium chloride       LOS: 1 day    Para Skeans, MD [facility] Pager [phone] If 7PM-7AM, please contact night-coverage [email or web address] Password [facility] 02/14/2020, 2:23 PM

## 2020-02-15 ENCOUNTER — Inpatient Hospital Stay: Payer: Medicare Other

## 2020-02-15 LAB — CBC WITH DIFFERENTIAL/PLATELET
Abs Immature Granulocytes: 0.03 10*3/uL (ref 0.00–0.07)
Basophils Absolute: 0 10*3/uL (ref 0.0–0.1)
Basophils Relative: 0 %
Eosinophils Absolute: 0 10*3/uL (ref 0.0–0.5)
Eosinophils Relative: 0 %
HCT: 32.8 % — ABNORMAL LOW (ref 36.0–46.0)
Hemoglobin: 10.2 g/dL — ABNORMAL LOW (ref 12.0–15.0)
Immature Granulocytes: 0 %
Lymphocytes Relative: 8 %
Lymphs Abs: 0.7 10*3/uL (ref 0.7–4.0)
MCH: 28.3 pg (ref 26.0–34.0)
MCHC: 31.1 g/dL (ref 30.0–36.0)
MCV: 90.9 fL (ref 80.0–100.0)
Monocytes Absolute: 0.3 10*3/uL (ref 0.1–1.0)
Monocytes Relative: 4 %
Neutro Abs: 7.3 10*3/uL (ref 1.7–7.7)
Neutrophils Relative %: 88 %
Platelets: 212 10*3/uL (ref 150–400)
RBC: 3.61 MIL/uL — ABNORMAL LOW (ref 3.87–5.11)
RDW: 14.1 % (ref 11.5–15.5)
WBC: 8.4 10*3/uL (ref 4.0–10.5)
nRBC: 0 % (ref 0.0–0.2)

## 2020-02-15 LAB — COMPREHENSIVE METABOLIC PANEL
ALT: 15 U/L (ref 0–44)
AST: 11 U/L — ABNORMAL LOW (ref 15–41)
Albumin: 3.6 g/dL (ref 3.5–5.0)
Alkaline Phosphatase: 69 U/L (ref 38–126)
Anion gap: 9 (ref 5–15)
BUN: 33 mg/dL — ABNORMAL HIGH (ref 6–20)
CO2: 35 mmol/L — ABNORMAL HIGH (ref 22–32)
Calcium: 9 mg/dL (ref 8.9–10.3)
Chloride: 84 mmol/L — ABNORMAL LOW (ref 98–111)
Creatinine, Ser: 0.75 mg/dL (ref 0.44–1.00)
GFR calc Af Amer: >60 mL/min (ref >60)
GFR calc non Af Amer: >60 mL/min (ref >60)
Glucose, Bld: 168 mg/dL — ABNORMAL HIGH (ref 70–99)
Potassium: 4.8 mmol/L (ref 3.5–5.1)
Sodium: 128 mmol/L — ABNORMAL LOW (ref 135–145)
Total Bilirubin: 0.5 mg/dL (ref 0.3–1.2)
Total Protein: 7.2 g/dL (ref 6.5–8.1)

## 2020-02-15 LAB — VALPROIC ACID LEVEL: Valproic Acid Lvl: 45 ug/mL — ABNORMAL LOW (ref 50.0–100.0)

## 2020-02-15 LAB — BLOOD GAS, VENOUS
Acid-Base Excess: 16.7 mmol/L — ABNORMAL HIGH (ref 0.0–2.0)
Acid-Base Excess: 17.9 mmol/L — ABNORMAL HIGH (ref 0.0–2.0)
Bicarbonate: 44.5 mmol/L — ABNORMAL HIGH (ref 20.0–28.0)
Bicarbonate: 43.9 mmol/L — ABNORMAL HIGH (ref 20.0–28.0)
O2 Saturation: 99.8 %
O2 Saturation: 99.5 %
Patient temperature: 37
Patient temperature: 37
pCO2, Ven: 67 mmHg — ABNORMAL HIGH (ref 44.0–60.0)
pCO2, Ven: 55 mmHg (ref 44.0–60.0)
pH, Ven: 7.43 (ref 7.250–7.430)
pH, Ven: 7.51 — ABNORMAL HIGH (ref 7.250–7.430)
pO2, Ven: 222 mmHg — ABNORMAL HIGH (ref 32.0–45.0)
pO2, Ven: 153 mmHg — ABNORMAL HIGH (ref 32.0–45.0)

## 2020-02-15 LAB — MAGNESIUM: Magnesium: 2 mg/dL (ref 1.7–2.4)

## 2020-02-15 MED ORDER — IPRATROPIUM-ALBUTEROL 0.5-2.5 (3) MG/3ML IN SOLN
3.0000 mL | Freq: Once | RESPIRATORY_TRACT | Status: AC
  Administered 2020-02-15: 3 mL via RESPIRATORY_TRACT

## 2020-02-15 MED ORDER — METHYLPREDNISOLONE SODIUM SUCC 40 MG IJ SOLR
40.0000 mg | Freq: Two times a day (BID) | INTRAMUSCULAR | Status: AC
  Administered 2020-02-15 – 2020-02-16 (×3): 40 mg via INTRAVENOUS
  Filled 2020-02-15 (×3): qty 1

## 2020-02-15 MED ORDER — GUAIFENESIN-DM 100-10 MG/5ML PO SYRP
5.0000 mL | ORAL_SOLUTION | ORAL | Status: DC | PRN
  Administered 2020-02-15 – 2020-02-18 (×4): 5 mL via ORAL
  Filled 2020-02-15 (×4): qty 5

## 2020-02-15 NOTE — Progress Notes (Signed)
PROGRESS NOTE    Natalie Thomas  UJW:[id number] DOB: 1960/10/14 DOA: 02/13/2020 PCP: System, Pcp Not In   Brief Narrative:  Natalie Thomas is a 59 y.o. female obese Caucasian femalewith a known history of  chronic diastolic CHF, schizophrenia, chronic hyponatremia, chronic respiratory failure on 3 L of oxygen, dyslipidemia and psychogenic polydipsia who was sent to the ER from the skilled nursing facility for evaluation of shortness of breath which she has had for the last 3 days.  Patient states that her symptoms have been worsening over the last 3 days associated with a cough productive of clear phlegm and wheezing.  She states that she checked her pulse ox on room air and it was in the 60s and so she notified the nursing home staff that she would like to be sent to the ER for evaluation.  She also complains of chest tightness that is nonradiating.  Upon arrival to the ER patient was noted to have an increased oxygen requirement and was on 6 L to maintain pulse oximetry greater than 92%.  She received a dose of Lasix and bronchodilator therapy and has been weaned down to 3 to 4 L to maintain sats greater than 92% She denies having any nausea, no vomiting, no changes in her bowel habits, no urinary symptoms, no dizziness or lightheadedness. Vital signs show significant elevated blood pressure with systolic blood pressure above 235mmHg with respiratory rate of 26.  She is afebrile Labs reveal sodium of 128, potassium 4.3, bicarb 33, glucose 113, BUN 13, creatinine 0.71, BNP 732, troponin 22 >> 25, white count 9.0, hemoglobin 10.9 Chest x-ray reviewed by me shows cardiomegaly and small bilateral pleural effusions. Twelve-lead EKG reviewed by me shows sinus rhythm with PVCs. Patient is a 59 year old Caucasian female with multiple medical problems who resides in a skilled nursing facility and was sent to the ER for evaluation of shortness of breath.  Upon arrival to the ER she was noted to have  increased work of breathing as well as an increased oxygen requirement 3L >> 6L to maintain pulse oximetry greater than 92%.  She had diffuse wheezes and received bronchodilator therapy in the ER.  Chest x-ray shows CHF.  Patient will be admitted to the hospital for further evaluation.   Assessment & Plan:   Principal Problem:   Acute on chronic diastolic CHF (congestive heart failure) (HCC) Active Problems:   DM (diabetes mellitus), type 2 (HCC)   Chronic hyponatremia   Schizoaffective disorder, bipolar type (HCC)   Anxiety and depression   Acute on chronic respiratory failure (HCC)   Acute diastolic (congestive) heart failure ([place])  1.  Acute on chronic diastolic dysfunction CHF Patient presents for evaluation of worsening shortness of breath from her baseline and states that her pulse oximetry at rest on room air was in the 60s. Her last known LVEF was 50 to 55% She had an increased oxygen requirement due to work of breathing and was placed on 6 L of oxygen via nasal cannula to maintain pulse oximetry greater than 92% Chest x-ray shows bilateral pleural effusions We will place patient on Lasix 40 mg IV every 12 We will wean oxygen down to patient's baseline of 3 L Optimize blood pressure control Maintain low-sodium diet.    2. Acute on chronic respiratory failure Most likely secondary to acute diastolic dysfunction CHF At baseline patient wears 3 L of oxygen but due to her increased work of breathing and hypoxemia despite being on 3 L she  was placed on 6 L Following diuresis as well as bronchodilator therapy patient has been weaned back down to 4 L of oxygen.  Pt to continue solumedrol and transition to po medrol dose pack.   3.Hyponatremia Chronic and stable Appears to be multifactorial and may be secondary to patient's antipsychotics, volume overload from CHF as well as psychogenic polydipsia Continue diuretics and place patient on a fluid restriction Repeat sodium  levels in a.m and follow.   4.COPD; chronic hypoxic respiratory failure Patient with a cough productive of clear phlegm as well as diffuse wheezes on exam Continue scheduled and as needed bronchodilator therapy Continue inhaled steroids and iv steroids and neb treatment.    5.Schizoaffective disorder Continue home regimen with Zyprexa, Depakote, Latuda, Wellbutrin, Klonopin  6.Hypertension Poorly controlled Continue Metoprolol and Nifedipine. Pain control and anxiety meds.    7. Nicotine Dependence Smoking cessation has been discussed with patient in detail Continue nicotine transdermal patch 21 mg daily.    DVT prophylaxis: Lovenox Code Status: Full code Family Communication: Greater than 50% of time was spent discussing patient's condition and plan of care with her in detail.  She verbalizes understanding and agrees with the plan. Disposition Plan: Back to previous home environment Consults called: None   Subjective: Pt seen today,   Objective: Vitals:   02/15/20 0814 02/15/20 1212 02/15/20 1401 02/15/20 1555  BP: (!) 147/89 130/88  110/69  Pulse: 82 69  70  Resp: 20 18  18   Temp: 98 F (36.7 C) (!) 97.5 F (36.4 C)  98.2 F (36.8 C)  TempSrc: Oral     SpO2: 100% 97% 96% 96%  Weight:      Height:        Intake/Output Summary (Last 24 hours) at 02/15/2020 1822 Last data filed at 02/15/2020 1820 Gross per 24 hour  Intake 600 ml  Output 4200 ml  Net -3600 ml   Filed Weights   02/13/20 0740 02/14/20 0400 02/15/20 0459  Weight: 99.8 kg 100.5 kg 100.1 kg    Examination: Blood pressure 110/69, pulse 70, temperature 98.2 F (36.8 C), resp. rate 18, height 5\' 5"  (1.651 m), weight 100.1 kg, SpO2 96 %. General exam: Appears calm and comfortable  Respiratory system: Clear to auscultation. Respiratory effort normal. Cardiovascular system: S1 & S2 heard, RRR. No JVD, murmurs, rubs, gallops or clicks. No pedal edema. Gastrointestinal system:  Abdomen is nondistended, soft and nontender. No organomegaly or masses felt. Normal bowel sounds heard. Central nervous system: Alert and oriented. No focal neurological deficits. Extremities: pt moving all her ext. Skin: No rashes, lesions or ulcers Psychiatry: Judgement and insight appear normal. Mood & affect appropriate.   Data Reviewed: I have personally reviewed following labs and imaging studies  CBC: Recent Labs  Lab 02/13/20 0744 02/15/20 0452  WBC 9.0 8.4  NEUTROABS  --  7.3  HGB 10.9* 10.2*  HCT 33.8* 32.8*  MCV 89.2 90.9  PLT 200 169   Basic Metabolic Panel: Recent Labs  Lab 02/13/20 0744 02/14/20 0509 02/15/20 0452  NA 128* 127* 128*  K 4.3 4.2 4.8  CL 84* 84* 84*  CO2 33* 34* 35*  GLUCOSE 113* 174* 168*  BUN 13 23* 33*  CREATININE 0.71 0.80 0.75  CALCIUM 9.0 8.7* 9.0  MG  --   --  2.0   GFR: Estimated Creatinine Clearance: 89.8 mL/min (by C-G formula based on SCr of 0.75 mg/dL).   Recent Labs  Lab 02/13/20 0744  LIPASE 27  Recent Results (from the past 240 hour(s))  SARS Coronavirus 2 by RT PCR (hospital order, performed in [facility] hospital lab) Nasopharyngeal Nasopharyngeal Swab     Status: None   Collection Time: 02/13/20  2:21 PM   Specimen: Nasopharyngeal Swab  Result Value Ref Range Status   SARS Coronavirus 2 NEGATIVE NEGATIVE Final    Comment: (NOTE) SARS-CoV-2 target nucleic acids are NOT DETECTED.  The SARS-CoV-2 RNA is generally detectable in upper and lower respiratory specimens during the acute phase of infection. The lowest concentration of SARS-CoV-2 viral copies this assay can detect is 250 copies / mL. A negative result does not preclude SARS-CoV-2 infection and should not be used as the sole basis for treatment or other patient management decisions.  A negative result may occur with improper specimen collection / handling, submission of specimen other than nasopharyngeal swab, presence of viral mutation(s) within  the areas targeted by this assay, and inadequate number of viral copies (<250 copies / mL). A negative result must be combined with clinical observations, patient history, and epidemiological information.  Fact Sheet for Patients:   StrictlyIdeas.no  Fact Sheet for Healthcare Providers: BankingDealers.co.za  This test is not yet approved or  cleared by the [place] FDA and has been authorized for detection and/or diagnosis of SARS-CoV-2 by FDA under an Emergency Use Authorization (EUA).  This EUA will remain in effect (meaning this test can be used) for the duration of the COVID-19 declaration under Section 564(b)(1) of the Act, 21 U.S.C. section 360bbb-3(b)(1), unless the authorization is terminated or revoked sooner.  Performed at [facility], [address] [place], [place] [address]   MRSA PCR Screening     Status: None   Collection Time: 02/14/20  5:51 AM   Specimen: Nasopharyngeal  Result Value Ref Range Status   MRSA by PCR NEGATIVE NEGATIVE Final    Comment:        The GeneXpert MRSA Assay (FDA approved for NASAL specimens only), is one component of a comprehensive MRSA colonization surveillance program. It is not intended to diagnose MRSA infection nor to guide or monitor treatment for MRSA infections. Performed at [facility], [address], [place], [place] [address]      Radiology Studies: DG Chest 1 View  Result Date: 02/15/2020 CLINICAL DATA:  Shortness of breath EXAM: CHEST  1 VIEW COMPARISON:  02/13/2020 FINDINGS: No substantial change. Persistent interstitial prominence with pulmonary vascular congestion. Stable mild cardiomegaly. No pleural effusion or pneumothorax. IMPRESSION: No substantial change since 02/13/2019. Pulmonary vascular congestion and mild cardiomegaly. Electronically Signed   By: Macy Mis M.D.   On: 02/15/2020 10:32   DG Chest 1 View  Result Date:  02/13/2020 CLINICAL DATA:  Short of breath, chest pain, abdominal pain, symptoms for 4 days EXAM: CHEST  1 VIEW COMPARISON:  02/13/2020 at 8:55 a.m. FINDINGS: Two frontal views of the chest demonstrates stable enlargement of the cardiac silhouette. Stable central vascular congestion without airspace disease. Trace bilateral effusions unchanged. No pneumothorax. No acute bony abnormalities. IMPRESSION: 1. Stable vascular congestion and trace bilateral pleural effusions. No overt edema. Electronically Signed   By: Randa Ngo M.D.   On: 02/13/2020 20:55   Scheduled Meds: . ascorbic acid  1,000 mg Oral Daily  . aspirin EC  81 mg Oral Daily  . buPROPion  150 mg Oral Daily  . calcium-vitamin D  1 tablet Oral Daily  . divalproex  250 mg Oral TID  . docusate sodium  100  mg Oral BID  . enoxaparin (LOVENOX) injection  40 mg Subcutaneous Q24H  . fluticasone furoate-vilanterol  1 puff Inhalation Daily  . furosemide  40 mg Intravenous Q12H  . loratadine  10 mg Oral Daily  . lurasidone  20 mg Oral BID  . mouth rinse  15 mL Mouth Rinse BID  . melatonin  5 mg Oral QHS  . methylPREDNISolone (SOLU-MEDROL) injection  40 mg Intravenous Q12H  . metoprolol succinate  12.5 mg Oral Daily  . multivitamin with minerals  1 tablet Oral Daily  . nicotine  21 mg Transdermal Daily  . NIFEdipine  60 mg Oral Daily  . OLANZapine  10 mg Oral BID  . pantoprazole  40 mg Oral Daily  . sodium chloride flush  3 mL Intravenous Q12H  . sucralfate  1 g Oral TID WC & HS   Continuous Infusions: . sodium chloride       LOS: 2 days  Anticipate d/c in am   Para Skeans, MD [facility] Pager [phone] If 7PM-7AM, please contact night-coverage [email or web address] Password [facility] 02/15/2020, 6:22 PM

## 2020-02-15 NOTE — TOC Initial Note (Signed)
Transition of Care [facility]) - Initial/Assessment Note    Patient Details  Name: Natalie Thomas MRN: [id number] Date of Birth: Oct 11, 1960  Transition of Care [facility]) CM/SW Contact:    Victorino Dike, RN Phone Number: 02/15/2020, 10:17 AM  Clinical Narrative:                 Patient has legal guardian and chooses for patient to return to [facility].  [facility] reports they can receive patient today and new covid test not necessary if discharged today.  Room 80A  Expected Discharge Plan: Long Term Nursing Home Barriers to Discharge: No Barriers Identified   Patient Goals and CMS Choice Patient states their goals for this hospitalization and ongoing recovery are:: return to [name] CMS Medicare.gov Compare Post Acute Care list provided to:: Legal Guardian Choice offered to / list presented to : [facility] POA / Guardian  Expected Discharge Plan and Services Expected Discharge Plan: [place]   Discharge Planning Services: CM Consult Post Acute Care Choice: Nursing Home Living arrangements for the past 2 months: [place]                                      Prior Living Arrangements/Services Living arrangements for the past 2 months: [place] Lives with:: Facility Resident Patient language and need for interpreter reviewed:: Yes        Need for Family Participation in Patient Care: Yes (Comment) Care giver support system in place?: Yes (comment) (Legal Guardian)   Criminal Activity/Legal Involvement Pertinent to Current Situation/Hospitalization: No - Comment as needed  Activities of Daily Living Home Assistive Devices/Equipment: Wheelchair ADL Screening (condition at time of admission) Patient's cognitive ability adequate to safely complete daily activities?: Yes Is the patient deaf or have difficulty hearing?: No Does the patient have difficulty seeing, even when wearing glasses/contacts?:  No Does the patient have difficulty concentrating, remembering, or making decisions?: No Patient able to express need for assistance with ADLs?: Yes Does the patient have difficulty dressing or bathing?: Yes Independently performs ADLs?: No Communication: Independent Dressing (OT): Needs assistance Is this a change from baseline?: Pre-admission baseline Grooming: Needs assistance Is this a change from baseline?: Pre-admission baseline Feeding: Independent Bathing: Needs assistance Is this a change from baseline?: Pre-admission baseline Toileting: Needs assistance Is this a change from baseline?: Pre-admission baseline In/Out Bed: Needs assistance Is this a change from baseline?: Pre-admission baseline Walks in Home: Needs assistance Is this a change from baseline?: Pre-admission baseline Does the patient have difficulty walking or climbing stairs?: Yes Weakness of Legs: Both Weakness of Arms/Hands: None  Permission Sought/Granted                  Emotional Assessment       Orientation: : Oriented to Self, Oriented to Place, Oriented to Situation, Oriented to  Time Alcohol / Substance Use: Not Applicable Psych Involvement: No (comment)  Admission diagnosis:  Shortness of breath [R06.02] SOB (shortness of breath) [R06.02] COPD exacerbation (HCC) [J44.1] Acute CHF (congestive heart failure) ([place]) [I50.9] Acute on chronic respiratory failure with hypoxia and hypercapnia (HCC) [J96.21, L46.50] Acute diastolic (congestive) heart failure (HCC) [I50.31] Acute on chronic congestive heart failure, unspecified heart failure type ([place]) [I50.9] Patient Active Problem List   Diagnosis Date Noted  . Acute CHF (congestive heart failure) ([place]) 02/13/2020  . Acute diastolic (congestive) heart failure (  [place]) 02/13/2020  . Acute respiratory failure ([place]) 10/18/2019  . DNR (do not resuscitate) discussion   . Goals of care, counseling/discussion   . Palliative care by specialist   .  Acute on chronic respiratory failure ([place]) 10/01/2019  . Acute on chronic diastolic CHF (congestive heart failure) ([place])   . Shortness of breath 09/22/2019  . Acute on chronic respiratory failure with hypercapnia ([place]) 09/13/2019  . Leukocytosis 09/13/2019  . Class 2 obesity with body mass index (BMI) of 39.0 to 39.9 in adult   . Acute on chronic respiratory failure with hypoxemia ([place]) 09/04/2019  . COVID-19 virus infection   . 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 Nausea   . Acute gastritis   . Abdominal pain, chronic, epigastric   . Hyponatremia 07/09/2019  . Ventral hernia 07/09/2019  . Hypertensive crisis 05/01/2019  . Acute abdominal pain 03/15/2019  . Pleural effusion 03/13/2019  . Atelectasis 03/13/2019  . Acute on chronic combined systolic and diastolic congestive heart failure ([place]) 03/13/2019  . Type 2 diabetes mellitus without complication ([place]) 16/06/930  . Urinary tract bacterial infections 03/09/2019  . Acute cystitis without hematuria   . Constipation   . Acute hypoxemic respiratory failure ([place]) 12/03/2018  . Irritability 11/09/2018  . Aggressive behavior 11/09/2018  . Agitation   . S/P right hip fracture 11/07/2017  . Closed comminuted intertrochanteric fracture of right femur ([place]) 11/07/2017  . Anxiety and depression   . Dyspnea 10/16/2017  . Anemia 10/16/2017  . COPD exacerbation ([place]) 10/07/2017  . Hyperlipidemia 10/07/2017  . Chronic respiratory failure with hypoxia ([place]) 10/07/2017  . Acute on chronic respiratory failure with hypoxia and hypercapnia ([place]) 10/07/2017  . Incontinence of urine 08/14/2011  . Schizoaffective disorder, bipolar type ([place]) 07/05/2011  . Cough 05/08/2011  . Lumbar disc disease 04/09/2011  . Polycythemia secondary to smoking 04/09/2011  . HIP PAIN, LEFT 12/12/2007  . 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 DM (diabetes mellitus), type 2 ([place]) 04/08/2007  . MENOPAUSE, PREMATURE 04/08/2007  . Chronic hyponatremia 04/08/2007   . Schizoaffective disorder ([place]) 04/08/2007  . Essential hypertension 04/08/2007  . COPD (chronic obstructive pulmonary disease) ([place]) 04/08/2007  . GERD 04/08/2007  . Osteoporosis 04/08/2007   PCP:  System, Pcp Not In Pharmacy:   [place], [place] [address] Phone: [phone] Fax: [place], [place] [place]. [place] [address] Phone: [phone] Fax: [phone]     Social Determinants of Health (SDOH) Interventions    Readmission Risk Interventions Readmission Risk Prevention Plan 01/25/2020 10/19/2019 09/15/2019  Transportation Screening Complete Complete Complete  Medication Review Press photographer) Complete Complete Complete  PCP or Specialist appointment within 3-5 days of discharge - Complete Complete  HRI or Home Care Consult - Complete Complete  HRI or Home Care Consult Pt Refusal Comments - - -  SW Recovery Care/Counseling Consult - - -  Palliative Care Screening Not Applicable - -  [place] Complete Complete Complete  Some recent data might be hidden

## 2020-02-16 ENCOUNTER — Other Ambulatory Visit: Payer: Self-pay | Admitting: Emergency Medicine

## 2020-02-16 LAB — CBC
HCT: 35.2 % — ABNORMAL LOW (ref 36.0–46.0)
Hemoglobin: 11.2 g/dL — ABNORMAL LOW (ref 12.0–15.0)
MCH: 28.5 pg (ref 26.0–34.0)
MCHC: 31.8 g/dL (ref 30.0–36.0)
MCV: 89.6 fL (ref 80.0–100.0)
Platelets: 263 10*3/uL (ref 150–400)
RBC: 3.93 MIL/uL (ref 3.87–5.11)
RDW: 13.9 % (ref 11.5–15.5)
WBC: 9.4 10*3/uL (ref 4.0–10.5)
nRBC: 0 % (ref 0.0–0.2)

## 2020-02-16 LAB — BASIC METABOLIC PANEL
Anion gap: 10 (ref 5–15)
BUN: 30 mg/dL — ABNORMAL HIGH (ref 6–20)
CO2: 36 mmol/L — ABNORMAL HIGH (ref 22–32)
Calcium: 8.9 mg/dL (ref 8.9–10.3)
Chloride: 81 mmol/L — ABNORMAL LOW (ref 98–111)
Creatinine, Ser: 0.6 mg/dL (ref 0.44–1.00)
GFR calc Af Amer: >60 mL/min (ref >60)
GFR calc non Af Amer: >60 mL/min (ref >60)
Glucose, Bld: 146 mg/dL — ABNORMAL HIGH (ref 70–99)
Potassium: 5 mmol/L (ref 3.5–5.1)
Sodium: 127 mmol/L — ABNORMAL LOW (ref 135–145)

## 2020-02-16 LAB — BLOOD GAS, VENOUS
Acid-Base Excess: 12.3 mmol/L — ABNORMAL HIGH (ref 0.0–2.0)
Acid-Base Excess: 20.2 mmol/L — ABNORMAL HIGH (ref 0.0–2.0)
Bicarbonate: 41.1 mmol/L — ABNORMAL HIGH (ref 20.0–28.0)
Bicarbonate: 48.2 mmol/L — ABNORMAL HIGH (ref 20.0–28.0)
FIO2: 0.21
FIO2: 0.36
O2 Saturation: 93.1 %
O2 Saturation: 77.1 %
Patient temperature: 37
Patient temperature: 37
pCO2, Ven: 78 mmHg (ref 44.0–60.0)
pCO2, Ven: 71 mmHg (ref 44.0–60.0)
pH, Ven: 7.33 (ref 7.250–7.430)
pH, Ven: 7.44 — ABNORMAL HIGH (ref 7.250–7.430)
pO2, Ven: 63 mmHg — ABNORMAL HIGH (ref 32.0–45.0)
pO2, Ven: 40 mmHg (ref 32.0–45.0)

## 2020-02-16 MED ORDER — LACTULOSE 10 GM/15ML PO SOLN
10.0000 g | Freq: Once | ORAL | Status: AC
  Administered 2020-02-16: 10 g via ORAL
  Filled 2020-02-16: qty 30

## 2020-02-16 NOTE — Care Management Important Message (Signed)
Important Message  Patient Details  Name: Natalie Thomas MRN: [id number] Date of Birth: 01/17/1961   Medicare Important Message Given:  Yes  Initial Medicare IM given by Patient Access Associate on 02/15/2020 at 11:44am.    Dannette Barbara 02/16/2020, 9:08 AM

## 2020-02-16 NOTE — Progress Notes (Signed)
Mobility Specialist - Progress Note   02/16/20 1400  Mobility  Activity Transferred:  Bed to chair  Level of Assistance Contact guard assist, steadying assist  Assistive Device Front wheel walker  Distance Ambulated (ft) 5 ft  Mobility Response Tolerated well  Mobility performed by Mobility specialist  $Mobility charge 1 Mobility    Pre-mobility: 65 HR, 124/83 BP, 97% SpO2 During mobility: 73 HR, 92% SpO2 Post-mobility: 68 HR, 137/66 BP, 97% SpO2   Pt was lying in bed upon arrival utilizing 2L [place]. Pt agreed to session. Pt was minA in sit-to-stand and CGA while ambulating with RW. Pt c/o SOB while getting to recliner, O2 desat to 92%. Heavy breathing was noted. Pt requested breathing treatment, RN was notified. Overall, pt tolerated session well. Pt was left in recliner with phone/call bell in reach.    Kathee Delton Mobility Specialist 02/16/20, 2:06 PM

## 2020-02-16 NOTE — Progress Notes (Signed)
PROGRESS NOTE    [name]  WRU:[id number] DOB: [birth year] DOA: 02/13/2020 PCP: System, Pcp Not In   Brief Narrative:  Natalie Thomas is a 59 y.o. female obese Caucasian femalewith a known history of  chronic diastolic CHF, schizophrenia, chronic hyponatremia, chronic respiratory failure on 3 L of oxygen, dyslipidemia and psychogenic polydipsia who was sent to the ER from the skilled nursing facility for evaluation of shortness of breath which she has had for the last 3 days.  Patient states that her symptoms have been worsening over the last 3 days associated with a cough productive of clear phlegm and wheezing.  She states that she checked her pulse ox on room air and it was in the 60s and so she notified the nursing home staff that she would like to be sent to the ER for evaluation.  She also complains of chest tightness that is nonradiating.  Upon arrival to the ER patient was noted to have an increased oxygen requirement and was on 6 L to maintain pulse oximetry greater than 92%.  She received a dose of Lasix and bronchodilator therapy and has been weaned down to 3 to 4 L to maintain sats greater than 92% She denies having any nausea, no vomiting, no changes in her bowel habits, no urinary symptoms, no dizziness or lightheadedness. Vital signs show significant elevated blood pressure with systolic blood pressure above 276mmHg with respiratory rate of 26.  She is afebrile Labs reveal sodium of 128, potassium 4.3, bicarb 33, glucose 113, BUN 13, creatinine 0.71, BNP 732, troponin 22 >> 25, white count 9.0, hemoglobin 10.9 Chest x-ray reviewed by me shows cardiomegaly and small bilateral pleural effusions. Twelve-lead EKG reviewed by me shows sinus rhythm with PVCs. Patient is a 59 year old Caucasian female with multiple medical problems who resides in a skilled nursing facility and was sent to the ER for evaluation of shortness of breath.  Upon arrival to the ER she was noted to have  increased work of breathing as well as an increased oxygen requirement 3L >> 6L to maintain pulse oximetry greater than 92%.  She had diffuse wheezes and received bronchodilator therapy in the ER.  Chest x-ray shows CHF.  Patient will be admitted to the hospital for further evaluation.  Patient seen from August 22 till today August 24 of 2021 by my service, hospital stay has been stable her breathing has improved slowly she is currently still wheezing and short of breath. Reviewed her respiratory failure secondary to COPD exacerbation and CHF exacerbation.  Continue IV steroids and transition to p.o. tomorrow continue nebulizer treatment.  Has not been started on any antibiotic regimen.  Chest x-ray shows no significant change interstitial prominence with pulmonary vascular congestion.  Patient's last echo was in April 2021 results as follows: 1. Left ventricular ejection fraction, by estimation, is 50 to 55%. The left ventricle has low normal function. The left ventricle demonstrates global hypokinesis. Left ventricular diastolic parameters are consistent with Grade I diastolic dysfunction (impaired relaxation). 2. Right ventricular systolic function is normal. The right ventricular size is normal. 3. The mitral valve is normal in structure. Mild to moderate mitral valve regurgitation. No evidence of mitral stenosis. 4. The aortic valve is normal in structure. Aortic valve regurgitation is not visualized. No aortic stenosis is present. 5. The inferior vena cava is normal in size with greater than 50% respiratory variability, suggesting right atrial pressure of 3 mmHg.  Assessment & Plan:   Principal Problem:   Acute  on chronic diastolic CHF (congestive heart failure) (HCC) Active Problems:   DM (diabetes mellitus), type 2 (HCC)   Chronic hyponatremia   Schizoaffective disorder, bipolar type (HCC)   Anxiety and depression   Acute on chronic respiratory failure (HCC)   Acute diastolic  (congestive) heart failure ([place])  1.  Acute on chronic diastolic dysfunction CHF Patient presents for evaluation of worsening shortness of breath from her baseline and states that her pulse oximetry at rest on room air was in the 60s. Her last known LVEF was 50 to 55% She had an increased oxygen requirement due to work of breathing and was placed on 6 L of oxygen via nasal cannula to maintain pulse oximetry greater than 92% Chest x-ray shows bilateral pleural effusions We will place patient on Lasix 40 mg IV every 12 We will wean oxygen down to patient's baseline of 3 L Optimize blood pressure control Maintain low-sodium diet. Output has been nearly 4 L so far.   2. Acute on chronic respiratory failure Most likely secondary to acute diastolic dysfunction CHF At baseline patient wears 3 L of oxygen but due to her increased work of breathing and hypoxemia despite being on 3 L she was placed on 6 L Following diuresis as well as bronchodilator therapy patient has been weaned back down to 4 L of oxygen.  Pt to continue solumedrol and transition to po medrol dose pack.   3.Hyponatremia Chronic and stable Appears to be multifactorial and may be secondary to patient's antipsychotics, volume overload from CHF as well as psychogenic polydipsia Continue diuretics and place patient on a fluid restriction Repeat sodium levels in a.m and follow.   4.COPD; chronic hypoxic respiratory failure Patient with a cough productive of clear phlegm as well as diffuse wheezes on exam Continue scheduled and as needed bronchodilator therapy Continue inhaled steroids and iv steroids and neb treatment.    5.Schizoaffective disorder Continue home regimen with Zyprexa, Depakote, Latuda, Wellbutrin, Klonopin  6.Hypertension Poorly controlled Continue Metoprolol and Nifedipine. Pain control and anxiety meds.    7. Nicotine Dependence Smoking cessation has been discussed with patient in  detail Continue nicotine transdermal patch 21 mg daily.    DVT prophylaxis: Lovenox Code Status: Full code Family Communication: Greater than 50% of time was spent discussing patient's condition and plan of care with her in detail.  She verbalizes understanding and agrees with the plan. Disposition Plan: Back to previous home environment Consults called: None   Subjective: Pt seen today stable today.  Patient seen today she is alert awake oriented ABG shows elevated oxygen level, patient weaned to 2 L.  He is on home O2,baseline was said to be 3 L at home.  Objective: Vitals:   02/16/20 0817 02/16/20 0900 02/16/20 1140 02/16/20 1552  BP: 136/85  133/77 135/78  Pulse: 80  62 76  Resp: 19  18 18   Temp: 98.2 F (36.8 C)  97.8 F (36.6 C) 98.2 F (36.8 C)  TempSrc:   Oral Oral  SpO2: 96% 94% 95% 96%  Weight:      Height:        Intake/Output Summary (Last 24 hours) at 02/16/2020 1632 Last data filed at 02/16/2020 1116 Gross per 24 hour  Intake 240 ml  Output 4300 ml  Net -4060 ml   Filed Weights   02/14/20 0400 02/15/20 0459 02/16/20 0459  Weight: 100.5 kg 100.1 kg 102.6 kg    Examination: Blood pressure 135/78, pulse 76, temperature 98.2 F (36.8 C), temperature  source Oral, resp. rate 18, height 5\' 5"  (1.651 m), weight 102.6 kg, SpO2 96 %. General exam: Appears calm and comfortable  Respiratory system: Clear to auscultation. Respiratory effort normal. Cardiovascular system: S1 & S2 heard, RRR. No JVD, murmurs, rubs, gallops or clicks. No pedal edema. Gastrointestinal system: Abdomen is nondistended, soft and nontender. No organomegaly or masses felt. Normal bowel sounds heard. Central nervous system: Alert and oriented. No focal neurological deficits. Extremities: pt moving all her ext. Skin: No rashes, lesions or ulcers Psychiatry: Judgement and insight appear normal. Mood & affect appropriate.   Data Reviewed: I have personally reviewed following labs and  imaging studies  CBC: Recent Labs  Lab 02/13/20 0744 02/15/20 0452 02/16/20 0506  WBC 9.0 8.4 9.4  NEUTROABS  --  7.3  --   HGB 10.9* 10.2* 11.2*  HCT 33.8* 32.8* 35.2*  MCV 89.2 90.9 89.6  PLT 200 212 119   Basic Metabolic Panel: Recent Labs  Lab 02/13/20 0744 02/14/20 0509 02/15/20 0452 02/16/20 0505  NA 128* 127* 128* 127*  K 4.3 4.2 4.8 5.0  CL 84* 84* 84* 81*  CO2 33* 34* 35* 36*  GLUCOSE 113* 174* 168* 146*  BUN 13 23* 33* 30*  CREATININE 0.71 0.80 0.75 0.60  CALCIUM 9.0 8.7* 9.0 8.9  MG  --   --  2.0  --    GFR: Estimated Creatinine Clearance: 91 mL/min (by C-G formula based on SCr of 0.6 mg/dL).   Recent Labs  Lab 02/13/20 0744  LIPASE 27    Recent Results (from the past 240 hour(s))  SARS Coronavirus 2 by RT PCR (hospital order, performed in [facility] hospital lab) Nasopharyngeal Nasopharyngeal Swab     Status: None   Collection Time: 02/13/20  2:21 PM   Specimen: Nasopharyngeal Swab  Result Value Ref Range Status   SARS Coronavirus 2 NEGATIVE NEGATIVE Final    Comment: (NOTE) SARS-CoV-2 target nucleic acids are NOT DETECTED.  The SARS-CoV-2 RNA is generally detectable in upper and lower respiratory specimens during the acute phase of infection. The lowest concentration of SARS-CoV-2 viral copies this assay can detect is 250 copies / mL. A negative result does not preclude SARS-CoV-2 infection and should not be used as the sole basis for treatment or other patient management decisions.  A negative result may occur with improper specimen collection / handling, submission of specimen other than nasopharyngeal swab, presence of viral mutation(s) within the areas targeted by this assay, and inadequate number of viral copies (<250 copies / mL). A negative result must be combined with clinical observations, patient history, and epidemiological information.  Fact Sheet for Patients:   StrictlyIdeas.no  Fact Sheet for  Healthcare Providers: BankingDealers.co.za  This test is not yet approved or  cleared by the [place] FDA and has been authorized for detection and/or diagnosis of SARS-CoV-2 by FDA under an Emergency Use Authorization (EUA).  This EUA will remain in effect (meaning this test can be used) for the duration of the COVID-19 declaration under Section 564(b)(1) of the Act, 21 U.S.C. section 360bbb-3(b)(1), unless the authorization is terminated or revoked sooner.  Performed at [facility], [address] [place], [place] [address]   MRSA PCR Screening     Status: None   Collection Time: 02/14/20  5:51 AM   Specimen: Nasopharyngeal  Result Value Ref Range Status   MRSA by PCR NEGATIVE NEGATIVE Final    Comment:        The GeneXpert MRSA Assay (  FDA approved for NASAL specimens only), is one component of a comprehensive MRSA colonization surveillance program. It is not intended to diagnose MRSA infection nor to guide or monitor treatment for MRSA infections. Performed at [facility], [address], [place], [place] [address]      Radiology Studies: DG Chest 1 View  Result Date: 02/15/2020 CLINICAL DATA:  Shortness of breath EXAM: CHEST  1 VIEW COMPARISON:  02/13/2020 FINDINGS: No substantial change. Persistent interstitial prominence with pulmonary vascular congestion. Stable mild cardiomegaly. No pleural effusion or pneumothorax. IMPRESSION: No substantial change since 02/13/2019. Pulmonary vascular congestion and mild cardiomegaly. Electronically Signed   By: Macy Mis M.D.   On: 02/15/2020 10:32   Scheduled Meds: . ascorbic acid  1,000 mg Oral Daily  . aspirin EC  81 mg Oral Daily  . buPROPion  150 mg Oral Daily  . calcium-vitamin D  1 tablet Oral Daily  . divalproex  250 mg Oral TID  . docusate sodium  100 mg Oral BID  . enoxaparin (LOVENOX) injection  40 mg Subcutaneous Q24H  . fluticasone furoate-vilanterol  1 puff  Inhalation Daily  . furosemide  40 mg Intravenous Q12H  . loratadine  10 mg Oral Daily  . lurasidone  20 mg Oral BID  . mouth rinse  15 mL Mouth Rinse BID  . melatonin  5 mg Oral QHS  . methylPREDNISolone (SOLU-MEDROL) injection  40 mg Intravenous Q12H  . metoprolol succinate  12.5 mg Oral Daily  . multivitamin with minerals  1 tablet Oral Daily  . nicotine  21 mg Transdermal Daily  . NIFEdipine  60 mg Oral Daily  . OLANZapine  10 mg Oral BID  . pantoprazole  40 mg Oral Daily  . sodium chloride flush  3 mL Intravenous Q12H  . sucralfate  1 g Oral TID WC & HS   Continuous Infusions: . sodium chloride       LOS: 3 days  Anticipate d/c in am   Para Skeans, MD [facility] Pager [phone] If 7PM-7AM, please contact night-coverage [email or web address] Password [facility] 02/16/2020, 4:32 PM

## 2020-02-17 DIAGNOSIS — F250 Schizoaffective disorder, bipolar type: Secondary | ICD-10-CM

## 2020-02-17 DIAGNOSIS — E871 Hypo-osmolality and hyponatremia: Secondary | ICD-10-CM

## 2020-02-17 LAB — BASIC METABOLIC PANEL
Anion gap: 10 (ref 5–15)
BUN: 36 mg/dL — ABNORMAL HIGH (ref 6–20)
CO2: 36 mmol/L — ABNORMAL HIGH (ref 22–32)
Calcium: 9.1 mg/dL (ref 8.9–10.3)
Chloride: 80 mmol/L — ABNORMAL LOW (ref 98–111)
Creatinine, Ser: 0.81 mg/dL (ref 0.44–1.00)
GFR calc Af Amer: >60 mL/min (ref >60)
GFR calc non Af Amer: >60 mL/min (ref >60)
Glucose, Bld: 226 mg/dL — ABNORMAL HIGH (ref 70–99)
Potassium: 4.6 mmol/L (ref 3.5–5.1)
Sodium: 126 mmol/L — ABNORMAL LOW (ref 135–145)

## 2020-02-17 LAB — BLOOD GAS, VENOUS
Acid-Base Excess: 18.2 mmol/L — ABNORMAL HIGH (ref 0.0–2.0)
Bicarbonate: 43.5 mmol/L — ABNORMAL HIGH (ref 20.0–28.0)
O2 Saturation: 99.5 %
Patient temperature: 37
pCO2, Ven: 52 mmHg (ref 44.0–60.0)
pH, Ven: 7.53 — ABNORMAL HIGH (ref 7.250–7.430)
pO2, Ven: 154 mmHg — ABNORMAL HIGH (ref 32.0–45.0)

## 2020-02-17 MED ORDER — FUROSEMIDE 40 MG PO TABS
40.0000 mg | ORAL_TABLET | Freq: Every day | ORAL | Status: DC
  Administered 2020-02-18 – 2020-02-19 (×2): 40 mg via ORAL
  Filled 2020-02-17 (×2): qty 1

## 2020-02-17 MED ORDER — AZITHROMYCIN 250 MG PO TABS
500.0000 mg | ORAL_TABLET | Freq: Every day | ORAL | Status: AC
  Administered 2020-02-17: 500 mg via ORAL
  Filled 2020-02-17: qty 2

## 2020-02-17 MED ORDER — IPRATROPIUM-ALBUTEROL 0.5-2.5 (3) MG/3ML IN SOLN
3.0000 mL | Freq: Four times a day (QID) | RESPIRATORY_TRACT | Status: DC
  Administered 2020-02-17 – 2020-02-18 (×4): 3 mL via RESPIRATORY_TRACT
  Filled 2020-02-17 (×4): qty 3

## 2020-02-17 MED ORDER — GUAIFENESIN ER 600 MG PO TB12
600.0000 mg | ORAL_TABLET | Freq: Two times a day (BID) | ORAL | Status: DC
  Administered 2020-02-17 – 2020-02-19 (×5): 600 mg via ORAL
  Filled 2020-02-17 (×5): qty 1

## 2020-02-17 MED ORDER — ALBUTEROL SULFATE (2.5 MG/3ML) 0.083% IN NEBU: 3.0000 mL | INHALATION_SOLUTION | RESPIRATORY_TRACT | Status: DC | PRN

## 2020-02-17 MED ORDER — AZITHROMYCIN 250 MG PO TABS
250.0000 mg | ORAL_TABLET | Freq: Every day | ORAL | Status: DC
  Administered 2020-02-18 – 2020-02-19 (×2): 250 mg via ORAL
  Filled 2020-02-17 (×2): qty 1

## 2020-02-17 MED ORDER — METHYLPREDNISOLONE SODIUM SUCC 125 MG IJ SOLR
60.0000 mg | Freq: Two times a day (BID) | INTRAMUSCULAR | Status: DC
  Administered 2020-02-17 – 2020-02-19 (×4): 60 mg via INTRAVENOUS
  Filled 2020-02-17 (×4): qty 2

## 2020-02-17 NOTE — Progress Notes (Signed)
PROGRESS NOTE    Natalie Thomas   OFB:[id number]  DOB: Nov 07, 1960  PCP: System, Pcp Not In    DOA: 02/13/2020 LOS: 4   Brief Narrative   Natalie Thomas is a 59 y.o. female  obese Caucasian female with a known history of  chronic diastolic CHF, schizophrenia, chronic hyponatremia, chronic respiratory failure on 3 L of oxygen, dyslipidemia and psychogenic polydipsia who was sent to the ER from the skilled nursing facility for evaluation of shortness of breath which she has had for the last 3 days.  Patient states that her symptoms have been worsening over the last 3 days associated with a cough productive of clear phlegm and wheezing.  She states that she checked her pulse ox on room air and it was in the 60s and so she notified the nursing home staff that she would like to be sent to the ER for evaluation.  She also complains of chest tightness that is nonradiating.  Upon arrival to the ER patient was noted to have an increased oxygen requirement and was on 6 L to maintain pulse oximetry greater than 92%.  She received a dose of Lasix and bronchodilator therapy and has been weaned down to 3 to 4 L to maintain sats greater than 92% She denies having any nausea, no vomiting, no changes in her bowel habits, no urinary symptoms, no dizziness or lightheadedness. Vital signs show significant elevated blood pressure with systolic blood pressure above 22mmHg with respiratory rate of 26.  She is afebrile Labs reveal sodium of 128, potassium 4.3, bicarb 33, glucose 113, BUN 13, creatinine 0.71, BNP 732, troponin 22 >> 25, white count 9.0, hemoglobin 10.9 Chest x-ray reviewed by me shows cardiomegaly and small bilateral pleural effusions. Twelve-lead EKG reviewed by me shows sinus rhythm with PVCs. Patient is a 59 year old Caucasian female with multiple medical problems who resides in a skilled nursing facility and was sent to the ER for evaluation of shortness of breath.  Upon arrival to the ER she  was noted to have increased work of breathing as well as an increased oxygen requirement 3L >> 6L to maintain pulse oximetry greater than 92%.  She had diffuse wheezes and received bronchodilator therapy in the ER.  Chest x-ray shows CHF.  Patient will be admitted to the hospital for further evaluation.   Patient seen from August 22 till today August 24 of 2021 by my service, hospital stay has been stable her breathing has improved slowly she is currently still wheezing and short of breath. Reviewed her respiratory failure secondary to COPD exacerbation and CHF exacerbation.  Continue IV steroids and transition to p.o. tomorrow continue nebulizer treatment.  Has not been started on any antibiotic regimen.  Chest x-ray shows no significant change interstitial prominence with pulmonary vascular congestion.  Patient's last echo was in April 2021 results as follows: 1. Left ventricular ejection fraction, by estimation, is 50 to 55%. The left ventricle has low normal function. The left ventricle demonstrates global hypokinesis. Left ventricular diastolic parameters are consistent with Grade I diastolic dysfunction (impaired relaxation). 2. Right ventricular systolic function is normal. The right ventricular size is normal. 3. The mitral valve is normal in structure. Mild to moderate mitral valve regurgitation. No evidence of mitral stenosis. 4. The aortic valve is normal in structure. Aortic valve regurgitation is not visualized. No aortic stenosis is present. 5. The inferior vena cava is normal in size with greater than 50% respiratory variability, suggesting right atrial pressure of 3  mmHg.     Assessment & Plan   Principal Problem:   Acute on chronic diastolic CHF (congestive heart failure) (HCC) Active Problems:   DM (diabetes mellitus), type 2 (HCC)   Chronic hyponatremia   Schizoaffective disorder, bipolar type (HCC)   Anxiety and depression   Acute on chronic respiratory failure (HCC)    Acute diastolic (congestive) heart failure (HCC)  Acute on chronic respiratory failure with hypoxia and hypercapnea - baseline uses 3 L/min.  Hypercapnia is compensated (pH 7.44).  Bipap trial overnight.    Acute on chronic diastolic dysfunction CHF - presented with worsening shortness of breath, pulse oximetry at rest on room air was in the 60s at home prior to arrival, required 6 L/min supplemental O2 to maintain sats.  Prior echo showed EF was 50 to 55%.  Chest xray with bilateral pleural effusions.  Presentation and imaging consistent with acutely decompensated CHF. Improving with IV diuresis.   Inaccurate I/O's (no IN's charted) but outputs have been good 3 L on 8/22 / 4.6 L on 8/23 / 2.9 L on 8/24. Appears euvolemic on exam today. --Transition Lasix to 40 mg PO daily --wean oxygen as tolerated to baseline 3 L/min or less --strict I/O's, daily weights  --Low sodium diet and fluid restriction 1200 cc/day  COPD with Acute Exacerbation - present on admission, and continues with diffuse wheezing on exam.  Had received IV steroids earlier this admission which had been stopped.  I resumed Solu-medrol 60 mg IV q12h today.  Scheduled Duonebs q6h while awake, PRN albuterol.  Bipap trial overnight.  Repeat VBG tomorrow AM.  Z-pack started today.  Hyponatremia - chronic and close to baseline.  Multifactorial - patient takes antipsychotics, presented volume overloaded with CHF, and has psychogenic polydipsia.   --Continue Lasix as above --Fluid restriction! --BMP's to monitor --Consider nephrology consult if worsening  Schizoaffective disorder - Continue home Zyprexa, Depakote, Latuda, Wellbutrin, Klonopin  Hypertension - chronic, poorly controlled.  Continue Metoprolol and Nifedipine.  Control pain and anxiety as well.  Nicotine Dependence - Smoking cessation discussed and advised.  Nicotine patch 21 mg daily.  Obesity: Body mass index is 37.64 kg/m.  Complicates overall care and  prognosis.  DVT prophylaxis: Place and maintain sequential compression device Start: 02/15/20 1354 enoxaparin (LOVENOX) injection 40 mg Start: 02/13/20 2200   Diet:  Diet Orders (From admission, onward)    Start     Ordered   02/17/20 1125  Diet heart healthy/carb modified Room service appropriate? Yes; Fluid consistency: Thin; Fluid restriction: 1200 mL Fluid  Diet effective now       Question Answer Comment  Diet-HS Snack? Nothing   Room service appropriate? Yes   Fluid consistency: Thin   Fluid restriction: 1200 mL Fluid      02/17/20 1124            Code Status: Full Code    Subjective 02/17/20    Patient seen up in chair this AM.  Says she feels worse than she did yesterday.  Continues to have cough and wheezing.  No fevers or chills.  No other specific acute complaints.    Disposition Plan & Communication   Status is: Inpatient  Remains inpatient appropriate because:IV treatments appropriate due to intensity of illness or inability to take PO   Dispo: The patient is from: SNF              Anticipated d/c is to: SNF  Anticipated d/c date is: 1 day              Patient currently is not medically stable to d/c.   Family Communication: none at bedside, will attempt to call.    Consults, Procedures, Significant Events   Consultants:   none  Procedures:   none   Objective   Vitals:   02/17/20 0820 02/17/20 1257 02/17/20 1428 02/17/20 1540  BP: 139/89 (!) 137/92  134/90  Pulse: 63 65  72  Resp: 16   18  Temp: 98.2 F (36.8 C) 97.9 F (36.6 C)  (!) 97.5 F (36.4 C)  TempSrc: Oral Oral  Oral  SpO2: 98% 97% 97% 94%  Weight:      Height:        Intake/Output Summary (Last 24 hours) at 02/17/2020 1657 Last data filed at 02/17/2020 1421 Gross per 24 hour  Intake 120 ml  Output 3100 ml  Net -2980 ml   Filed Weights   02/14/20 0400 02/15/20 0459 02/16/20 0459  Weight: 100.5 kg 100.1 kg 102.6 kg    Physical Exam:  General exam:  awake, alert, no acute distress, obese Respiratory system: diffuse expiratory wheezes, normal respiratory effort, on 2 L/min oxygen by [place] Cardiovascular system: normal S1/S2, RRR, no pedal edema.   Gastrointestinal system: soft, NT, ND, no HSM felt, +bowel sounds. Central nervous system: A&O x3. no gross focal neurologic deficits, normal speech Extremities: moves all, no cyanosis, normal tone Psychiatry: normal mood, congruent affect  Labs   Data Reviewed: I have personally reviewed following labs and imaging studies  CBC: Recent Labs  Lab 02/13/20 0744 02/15/20 0452 02/16/20 0506  WBC 9.0 8.4 9.4  NEUTROABS  --  7.3  --   HGB 10.9* 10.2* 11.2*  HCT 33.8* 32.8* 35.2*  MCV 89.2 90.9 89.6  PLT 200 212 443   Basic Metabolic Panel: Recent Labs  Lab 02/13/20 0744 02/14/20 0509 02/15/20 0452 02/16/20 0505 02/17/20 0414  NA 128* 127* 128* 127* 126*  K 4.3 4.2 4.8 5.0 4.6  CL 84* 84* 84* 81* 80*  CO2 33* 34* 35* 36* 36*  GLUCOSE 113* 174* 168* 146* 226*  BUN 13 23* 33* 30* 36*  CREATININE 0.71 0.80 0.75 0.60 0.81  CALCIUM 9.0 8.7* 9.0 8.9 9.1  MG  --   --  2.0  --   --    GFR: Estimated Creatinine Clearance: 89.9 mL/min (by C-G formula based on SCr of 0.81 mg/dL). Liver Function Tests: Recent Labs  Lab 02/15/20 0452  AST 11*  ALT 15  ALKPHOS 69  BILITOT 0.5  PROT 7.2  ALBUMIN 3.6   Recent Labs  Lab 02/13/20 0744  LIPASE 27   No results for input(s): AMMONIA in the last 168 hours. Coagulation Profile: No results for input(s): INR, PROTIME in the last 168 hours. Cardiac Enzymes: No results for input(s): CKTOTAL, CKMB, CKMBINDEX, TROPONINI in the last 168 hours. BNP (last 3 results) No results for input(s): PROBNP in the last 8760 hours. HbA1C: No results for input(s): HGBA1C in the last 72 hours. CBG: No results for input(s): GLUCAP in the last 168 hours. Lipid Profile: No results for input(s): CHOL, HDL, LDLCALC, TRIG, CHOLHDL, LDLDIRECT in the last 72  hours. Thyroid Function Tests: No results for input(s): TSH, T4TOTAL, FREET4, T3FREE, THYROIDAB in the last 72 hours. Anemia Panel: No results for input(s): VITAMINB12, FOLATE, FERRITIN, TIBC, IRON, RETICCTPCT in the last 72 hours. Sepsis Labs: No results for input(s): PROCALCITON, LATICACIDVEN in  the last 168 hours.  Recent Results (from the past 240 hour(s))  SARS Coronavirus 2 by RT PCR (hospital order, performed in [facility] hospital lab) Nasopharyngeal Nasopharyngeal Swab     Status: None   Collection Time: 02/13/20  2:21 PM   Specimen: Nasopharyngeal Swab  Result Value Ref Range Status   SARS Coronavirus 2 NEGATIVE NEGATIVE Final    Comment: (NOTE) SARS-CoV-2 target nucleic acids are NOT DETECTED.  The SARS-CoV-2 RNA is generally detectable in upper and lower respiratory specimens during the acute phase of infection. The lowest concentration of SARS-CoV-2 viral copies this assay can detect is 250 copies / mL. A negative result does not preclude SARS-CoV-2 infection and should not be used as the sole basis for treatment or other patient management decisions.  A negative result may occur with improper specimen collection / handling, submission of specimen other than nasopharyngeal swab, presence of viral mutation(s) within the areas targeted by this assay, and inadequate number of viral copies (<250 copies / mL). A negative result must be combined with clinical observations, patient history, and epidemiological information.  Fact Sheet for Patients:   StrictlyIdeas.no  Fact Sheet for Healthcare Providers: BankingDealers.co.za  This test is not yet approved or  cleared by the [place] FDA and has been authorized for detection and/or diagnosis of SARS-CoV-2 by FDA under an Emergency Use Authorization (EUA).  This EUA will remain in effect (meaning this test can be used) for the duration of the COVID-19 declaration under  Section 564(b)(1) of the Act, 21 U.S.C. section 360bbb-3(b)(1), unless the authorization is terminated or revoked sooner.  Performed at [facility], [address] [place], [place] [address]   MRSA PCR Screening     Status: None   Collection Time: 02/14/20  5:51 AM   Specimen: Nasopharyngeal  Result Value Ref Range Status   MRSA by PCR NEGATIVE NEGATIVE Final    Comment:        The GeneXpert MRSA Assay (FDA approved for NASAL specimens only), is one component of a comprehensive MRSA colonization surveillance program. It is not intended to diagnose MRSA infection nor to guide or monitor treatment for MRSA infections. Performed at [facility], [address], [place], [place] [address]       Imaging Studies   No results found.   Medications   Scheduled Meds: . ascorbic acid  1,000 mg Oral Daily  . aspirin EC  81 mg Oral Daily  . buPROPion  150 mg Oral Daily  . calcium-vitamin D  1 tablet Oral Daily  . divalproex  250 mg Oral TID  . docusate sodium  100 mg Oral BID  . enoxaparin (LOVENOX) injection  40 mg Subcutaneous Q24H  . fluticasone furoate-vilanterol  1 puff Inhalation Daily  . [START ON 02/18/2020] furosemide  40 mg Oral Daily  . guaiFENesin  600 mg Oral BID  . ipratropium-albuterol  3 mL Nebulization Q6H WA  . loratadine  10 mg Oral Daily  . lurasidone  20 mg Oral BID  . mouth rinse  15 mL Mouth Rinse BID  . melatonin  5 mg Oral QHS  . methylPREDNISolone (SOLU-MEDROL) injection  60 mg Intravenous Q12H  . metoprolol succinate  12.5 mg Oral Daily  . multivitamin with minerals  1 tablet Oral Daily  . nicotine  21 mg Transdermal Daily  . NIFEdipine  60 mg Oral Daily  . OLANZapine  10 mg Oral BID  . pantoprazole  40 mg Oral Daily  .  sodium chloride flush  3 mL Intravenous Q12H  . sucralfate  1 g Oral TID WC & HS   Continuous Infusions: . sodium chloride         LOS: 4 days    Time spent: 30 minutes    Ezekiel Slocumb,  DO [facility]  02/17/2020, 4:57 PM    If 7PM-7AM, please contact night-coverage. How to contact the [facility] Attending or Consulting provider [place] or covering provider during after hours [place], for this patient?    1. Check the care team in [facility] and look for a) attending/consulting TRH provider listed and b) the [facility] team listed 2. Log into [email or web address] and use [place]'s universal password to access. If you do not have the password, please contact the hospital operator. 3. Locate the [facility] provider you are looking for under [facility] and page to a number that you can be directly reached. 4. If you still have difficulty reaching the provider, please page the [facility] (Director on Call) for the Hospitalists listed on amion for assistance.

## 2020-02-17 NOTE — Progress Notes (Signed)
Dr. Arbutus Ped made aware of patient's venous blood gas results. Orders for continuous BiPAP put in.

## 2020-02-17 NOTE — TOC Progression Note (Signed)
Transition of Care [facility]) - Progression Note    Patient Details  Name: Natalie Thomas MRN: [id number] Date of Birth: 06-11-1961  [facility] CM/SW [place], RN Phone Number: 02/17/2020, 12:35 PM  Clinical Narrative:     Palliative care to follow patient after discharge at [facility] .    Expected Discharge Plan: Long Term Nursing Home Barriers to Discharge: No Barriers Identified  Expected Discharge Plan and Services Expected Discharge Plan: [place]   Discharge Planning Services: CM Consult Post Acute Care Choice: Nursing Home Living arrangements for the past 2 months: [place]                                       Social Determinants of Health (SDOH) Interventions    Readmission Risk Interventions Readmission Risk Prevention Plan 01/25/2020 10/19/2019 09/15/2019  Transportation Screening Complete Complete Complete  Medication Review Press photographer) Complete Complete Complete  PCP or Specialist appointment within 3-5 days of discharge - Complete Complete  HRI or [place] - Complete Complete  HRI or Home Care Consult Pt Refusal Comments - - -  SW Recovery Care/Counseling Consult - - -  Palliative Care Screening Not Applicable - -  Skilled Nursing Facility Complete Complete Complete  Some recent data might be hidden

## 2020-02-17 NOTE — Progress Notes (Signed)
Mobility Specialist - Progress Note   02/17/20 1511  Mobility  Activity  (Heel Slides, Quad sets, Straight leg raises, Hip Iso)  Range of Motion/Exercises Right leg;Left leg  Level of Assistance Standby assist, set-up cues, supervision of patient - no hands on  Assistive Device None  Distance Ambulated (ft) 0 ft  Mobility Response Tolerated well  Mobility performed by Mobility specialist  $Mobility charge 1 Mobility    Pre-mobility: 68 HR, 123/77 BP, 96% SpO2 During mobility: 75 HR, 96% SpO2 Post-mobility: 72 HR, 125/80 BP, 96% SpO2   Pt was lying in bed upon arrival. Pt agreed to session. Pt c/o pain in back and LE "8/10". Pt was able to perform bed exercises (heel slides, straight leg raises, ankle pumps, hip isometrics, and quad sets). Noticed signs of struggle on her face while completing heel slides, pt's attempt was limited d/t pain in back. Overall, pt tolerated session well. Pt c/o of tiredness after session. Pt remains in bed with phone/call bell in reach. Nurse was notified.    Kathee Delton Mobility Specialist 02/17/20, 3:22 PM

## 2020-02-17 NOTE — Hospital Course (Addendum)
Natalie Thomas is a 59 y.o. female  obese Caucasian female with a known history of  chronic diastolic CHF, schizophrenia, chronic hyponatremia, chronic respiratory failure on 3 L of oxygen, dyslipidemia and psychogenic polydipsia who was sent to the ER from the skilled nursing facility for evaluation of worsening shortness of breath which she has had for the last 3 days.  Also reported cough productive of clear phlegm, wheezing, mild chest tightness.  Reported she checked her pulse ox on room air and it was in the 60s and so she notified the nursing home staff and requested to come to the ER for evaluation.   In the ED, increased oxygen requirement and was on 6 L/min to maintain O2 sats.   Vital signs showed significant elevated blood pressure with SBP>273mmHg, RR of 26, afebrile.  Treated with Lasix and bronchodilator therapy and was able to be weaned down to 3 to 4 L/min oxygen.    Labs reveal sodium of 128, potassium 4.3, bicarb 33, glucose 113, BUN 13, creatinine 0.71, BNP 732, troponin 22 >> 25, white count 9.0, hemoglobin 10.9.  Chest x-ray showed cardiomegaly and small bilateral pleural effusions.  Admitted to hospitalist service for further management of acute on chronic respiratory failure with hypoxia secondary CHF decompensation and COPD exacerbation.  Impression from 2D Echo in April 2021:  1. Left ventricular ejection fraction, by estimation, is 50 to 55%. The left ventricle has low normal function. The left ventricle demonstrates global hypokinesis. Left ventricular diastolic parameters are consistent with Grade I diastolic dysfunction (impaired relaxation). 2. Right ventricular systolic function is normal. The right ventricular size is normal. 3. The mitral valve is normal in structure. Mild to moderate mitral valve regurgitation. No evidence of mitral stenosis. 4. The aortic valve is normal in structure. Aortic valve regurgitation is not visualized. No aortic stenosis is  present. 5. The inferior vena cava is normal in size with greater than 50% respiratory variability, suggesting right atrial pressure of 3 mmHg.

## 2020-02-17 NOTE — Progress Notes (Signed)
Physical Therapy Treatment Patient Details Name: Natalie Thomas MRN: [id number] DOB: [birth year] Today's Date: 02/17/2020    History of Present Illness Per MD note: Natalie Thomas is a 59 y.o. female obese Caucasian femalewith a known history of  chronic diastolic CHF, schizophrenia, chronic hyponatremia, chronic respiratory failure on 3 L of oxygen, dyslipidemia and psychogenic polydipsia who was sent to the ER from the skilled nursing facility for evaluation of shortness of breath which she has had for the last 3 days.  Patient states that her symptoms have been worsening over the last 3 days associated with a cough productive of clear phlegm and wheezing.  She states that she checked her pulse ox on room air and it was in the 60s and so she notified the nursing home staff that she would like to be sent to the ER for evaluation.  She also complains of chest tightness that is nonradiating.  Upon arrival to the ER patient was noted to have an increased oxygen requirement and was on 6 L to maintain pulse oximetry greater than 92%.  She received a dose of Lasix and bronchodilator therapy and has been weaned down to 3 to 4 L to maintain sats greater than 92%    PT Comments    Pt was long sitting in bed upon arriving with 2 L O2 [place]. sao2 98% and HR 82 bpm. Pt agrees to PT session and is motivated for OOB activity. She was able to exit L side of bed with Min assist, stand to RW with close supervision, and then ambulate ~ 25 ft without LOB. CGA for safety. Pt's vitals are stable but she endorses fatigue and request to have seated rest. At conclusion of session; she was sitting in recliner, with call bell in reach, chair alarm in place, and pt resting comfortably.    Follow Up Recommendations  SNF     Equipment Recommendations  Other (comment) (defer to next level of care)    Recommendations for Other Services       Precautions / Restrictions Precautions Precautions:  None Restrictions Weight Bearing Restrictions: No    Mobility  Bed Mobility Overal bed mobility: Needs Assistance Bed Mobility: Supine to Sit;Sit to Supine     Supine to sit: Min assist     General bed mobility comments: Min assist to exit L side of bed.  Transfers Overall transfer level: Needs assistance Equipment used: Rolling walker (2 wheeled) Transfers: Sit to/from Stand Sit to Stand: Supervision         General transfer comment: Pt was able to stand from EOB and recliner with supervision + vcs for improved technique.   Ambulation/Gait Ambulation/Gait assistance: Min guard Gait Distance (Feet): 30 Feet Assistive device: Rolling walker (2 wheeled) Gait Pattern/deviations: Trunk flexed;Decreased step length - right;Decreased step length - left;Step-through pattern Gait velocity: decreased   General Gait Details: pt was able to ambulate in room ~ 25 ft with RW without LOB. Pt does endorse fatigue however HR/and O2 stable. pt was on 2 L o2 [place] thorughout session   Stairs             Wheelchair Mobility    Modified Rankin (Stroke Patients Only)       Balance Overall balance assessment: Needs assistance Sitting-balance support: No upper extremity supported;Feet supported Sitting balance-Leahy Scale: Good Sitting balance - Comments: steady sitting reaching within BOS   Standing balance support: During functional activity;Bilateral upper extremity supported;Single extremity supported Standing balance-Leahy Scale: Fair Standing balance  comment: BUE support required with functional transfer. Pt able to briefly stand at EOB unassisted, reaches for bed rail when sitting onto EOB.                            Cognition Arousal/Alertness: Awake/alert Behavior During Therapy: WFL for tasks assessed/performed Overall Cognitive Status: No family/caregiver present to determine baseline cognitive functioning                                  General Comments: Pt was alert and oriented x 3. slightly disoriented to situation. Overall, pt able to follow commands and very pleasant      Exercises      General Comments        Pertinent Vitals/Pain Pain Assessment: 0-10 Pain Score: 2  Faces Pain Scale: Hurts a little bit Pain Location: BLEs posterior calves Pain Descriptors / Indicators: Aching;Grimacing;Guarding Pain Intervention(s): Limited activity within patient's tolerance;Monitored during session;Premedicated before session;Repositioned    Home Living                      Prior Function            PT Goals (current goals can now be found in the care plan section) Acute Rehab PT Goals Patient Stated Goal: to get stronger Progress towards PT goals: Progressing toward goals    Frequency    Min 2X/week      PT Plan Current plan remains appropriate    Co-evaluation              AM-PAC PT "6 Clicks" Mobility   Outcome Measure  Help needed turning from your back to your side while in a flat bed without using bedrails?: A Little Help needed moving from lying on your back to sitting on the side of a flat bed without using bedrails?: A Lot Help needed moving to and from a bed to a chair (including a wheelchair)?: A Lot Help needed standing up from a chair using your arms (e.g., wheelchair or bedside chair)?: A Lot Help needed to walk in hospital room?: A Lot Help needed climbing 3-5 steps with a railing? : A Lot 6 Click Score: 13    End of Session Equipment Utilized During Treatment: Gait belt;Oxygen Activity Tolerance: Patient tolerated treatment well;Patient limited by fatigue Patient left: in chair;with call bell/phone within reach;with chair alarm set Nurse Communication: Mobility status PT Visit Diagnosis: Unsteadiness on feet (R26.81);Muscle weakness (generalized) (M62.81);Difficulty in walking, not elsewhere classified (R26.2)     Time: 1660-6301 PT Time Calculation (min) (ACUTE  ONLY): 24 min  Charges:  $Gait Training: 8-22 mins $Therapeutic Activity: 8-22 mins                     Julaine Fusi PTA 02/17/20, 12:18 PM

## 2020-02-17 NOTE — Progress Notes (Addendum)
[facility]) Hospital Liaison RN note:  This patient is currently followed by Out Patient Based Palliative Care with [employer]. Roff, TOC is aware.  We will follow for disposition and update team.  Thank you.  Zandra Abts, RN [facility] Liaison [phone]

## 2020-02-18 LAB — BASIC METABOLIC PANEL
Anion gap: 9 (ref 5–15)
BUN: 33 mg/dL — ABNORMAL HIGH (ref 6–20)
CO2: 33 mmol/L — ABNORMAL HIGH (ref 22–32)
Calcium: 9.1 mg/dL (ref 8.9–10.3)
Chloride: 85 mmol/L — ABNORMAL LOW (ref 98–111)
Creatinine, Ser: 0.85 mg/dL (ref 0.44–1.00)
GFR calc Af Amer: >60 mL/min (ref >60)
GFR calc non Af Amer: >60 mL/min (ref >60)
Glucose, Bld: 189 mg/dL — ABNORMAL HIGH (ref 70–99)
Potassium: 4.5 mmol/L (ref 3.5–5.1)
Sodium: 127 mmol/L — ABNORMAL LOW (ref 135–145)

## 2020-02-18 LAB — BLOOD GAS, ARTERIAL
Acid-Base Excess: 12.9 mmol/L — ABNORMAL HIGH (ref 0.0–2.0)
Bicarbonate: 38.6 mmol/L — ABNORMAL HIGH (ref 20.0–28.0)
FIO2: 0.28
O2 Saturation: 96.9 %
Patient temperature: 37
pCO2 arterial: 53 mmHg — ABNORMAL HIGH (ref 32.0–48.0)
pH, Arterial: 7.47 — ABNORMAL HIGH (ref 7.350–7.450)
pO2, Arterial: 84 mmHg (ref 83.0–108.0)

## 2020-02-18 LAB — LIPID PANEL
Cholesterol: 158 mg/dL (ref 0–200)
HDL: 59 mg/dL (ref >40)
LDL Cholesterol: 70 mg/dL (ref 0–99)
Total CHOL/HDL Ratio: 2.7 RATIO
Triglycerides: 145 mg/dL (ref <150)
VLDL: 29 mg/dL (ref 0–40)

## 2020-02-18 MED ORDER — TRAZODONE HCL 50 MG PO TABS
50.0000 mg | ORAL_TABLET | Freq: Every evening | ORAL | Status: DC | PRN
  Administered 2020-02-18: 50 mg via ORAL
  Filled 2020-02-18: qty 1

## 2020-02-18 MED ORDER — IPRATROPIUM-ALBUTEROL 0.5-2.5 (3) MG/3ML IN SOLN
3.0000 mL | Freq: Three times a day (TID) | RESPIRATORY_TRACT | Status: DC
  Administered 2020-02-18 – 2020-02-19 (×4): 3 mL via RESPIRATORY_TRACT
  Filled 2020-02-18 (×4): qty 3

## 2020-02-18 MED ORDER — HYDROCODONE-ACETAMINOPHEN 5-325 MG PO TABS
1.0000 | ORAL_TABLET | Freq: Four times a day (QID) | ORAL | Status: DC | PRN
  Administered 2020-02-18 – 2020-02-19 (×3): 1 via ORAL
  Filled 2020-02-18 (×3): qty 1

## 2020-02-18 NOTE — Progress Notes (Signed)
Mobility Specialist - Progress Note   02/18/20 1628  Mobility  Activity Transferred:  Chair to bed  Range of Motion/Exercises Right leg;Left leg (Heel slides, seated march, straight leg raises, ankle pumps)  Level of Assistance Contact guard assist, steadying assist  Assistive Device Front wheel walker  Distance Ambulated (ft) 5 ft  Mobility Response Tolerated well  Mobility performed by Mobility specialist  $Mobility charge 1 Mobility    Pre-mobility: 63 HR, 119/82 BP, 97% SpO2 During mobility: 74 HR, 94% SpO2 Post-mobility: 71 HR, 111/73 BP, 95% SpO2   Pt was sitting in recliner upon arrival with nurse present in room. Pt agreed to session. Pt c/o pain in her legs, buttocks, and back "8/10". Pt was SBA in sit-to-stand and CGA while ambulating with RW, utilizing 2L [place] throughout session. After getting pt to EOB, pt c/o SOB. O2 desat to 94%. However, pt was motivated to perform EOB exercises (ankle pumps, heel slides, seated march, straight leg raises). Pt was left in bed with phone/call bell in reach. Nurse entered room at end of session.    Kathee Delton Mobility Specialist 02/18/20, 4:36 PM

## 2020-02-18 NOTE — Progress Notes (Signed)
PROGRESS NOTE    Natalie Thomas   QJJ:[id number]  DOB: May 07, 1961  PCP: System, Pcp Not In    DOA: 02/13/2020 LOS: 5   Brief Narrative   Natalie Thomas is a 59 y.o. female  obese Caucasian female with a known history of  chronic diastolic CHF, schizophrenia, chronic hyponatremia, chronic respiratory failure on 3 L of oxygen, dyslipidemia and psychogenic polydipsia who was sent to the ER from the skilled nursing facility for evaluation of shortness of breath which she has had for the last 3 days.  Patient states that her symptoms have been worsening over the last 3 days associated with a cough productive of clear phlegm and wheezing.  She states that she checked her pulse ox on room air and it was in the 60s and so she notified the nursing home staff that she would like to be sent to the ER for evaluation.  She also complains of chest tightness that is nonradiating.  Upon arrival to the ER patient was noted to have an increased oxygen requirement and was on 6 L to maintain pulse oximetry greater than 92%.  She received a dose of Lasix and bronchodilator therapy and has been weaned down to 3 to 4 L to maintain sats greater than 92% She denies having any nausea, no vomiting, no changes in her bowel habits, no urinary symptoms, no dizziness or lightheadedness. Vital signs show significant elevated blood pressure with systolic blood pressure above 217mmHg with respiratory rate of 26.  She is afebrile Labs reveal sodium of 128, potassium 4.3, bicarb 33, glucose 113, BUN 13, creatinine 0.71, BNP 732, troponin 22 >> 25, white count 9.0, hemoglobin 10.9 Chest x-ray reviewed by me shows cardiomegaly and small bilateral pleural effusions. Twelve-lead EKG reviewed by me shows sinus rhythm with PVCs. Patient is a 59 year old Caucasian female with multiple medical problems who resides in a skilled nursing facility and was sent to the ER for evaluation of shortness of breath.  Upon arrival to the ER she  was noted to have increased work of breathing as well as an increased oxygen requirement 3L >> 6L to maintain pulse oximetry greater than 92%.  She had diffuse wheezes and received bronchodilator therapy in the ER.  Chest x-ray shows CHF.  Patient will be admitted to the hospital for further evaluation.   Patient seen from August 22 till today August 24 of 2021 by my service, hospital stay has been stable her breathing has improved slowly she is currently still wheezing and short of breath. Reviewed her respiratory failure secondary to COPD exacerbation and CHF exacerbation.  Continue IV steroids and transition to p.o. tomorrow continue nebulizer treatment.  Has not been started on any antibiotic regimen.  Chest x-ray shows no significant change interstitial prominence with pulmonary vascular congestion.  Patient's last echo was in April 2021 results as follows: 1. Left ventricular ejection fraction, by estimation, is 50 to 55%. The left ventricle has low normal function. The left ventricle demonstrates global hypokinesis. Left ventricular diastolic parameters are consistent with Grade I diastolic dysfunction (impaired relaxation). 2. Right ventricular systolic function is normal. The right ventricular size is normal. 3. The mitral valve is normal in structure. Mild to moderate mitral valve regurgitation. No evidence of mitral stenosis. 4. The aortic valve is normal in structure. Aortic valve regurgitation is not visualized. No aortic stenosis is present. 5. The inferior vena cava is normal in size with greater than 50% respiratory variability, suggesting right atrial pressure of 3  mmHg.     Assessment & Plan   Principal Problem:   Acute on chronic diastolic CHF (congestive heart failure) (HCC) Active Problems:   DM (diabetes mellitus), type 2 (HCC)   Chronic hyponatremia   Schizoaffective disorder, bipolar type (HCC)   Anxiety and depression   Acute on chronic respiratory failure (HCC)    Acute diastolic (congestive) heart failure (HCC)   Acute on chronic respiratory failure with hypoxia and hypercapnea - due to COPD and CHF.  Baseline uses 3 L/min.  Hypercapnia improved after Bipap trial overnight.  Continue nightly Bipap.  Would recommend Bipap at SNF if feasible.    Acute on chronic diastolic dysfunction CHF - presented with worsening shortness of breath, pulse oximetry at rest on room air was in the 60s at home prior to arrival, required 6 L/min supplemental O2 to maintain sats.  Prior echo showed EF was 50 to 55%.  Chest xray with bilateral pleural effusions.  Presentation and imaging consistent with acutely decompensated CHF. Improving with IV diuresis.   Inaccurate I/O's (no IN's charted) but outputs have been good 3 L on 8/22 / 4.6 L on 8/23 / 2.9 L on 8/24 / 3.5 L on 8/25.Marland Kitchen Appears euvolemic on exam.  IV Lasix stopped on 8/25. --Lasix to 40 mg PO daily --wean oxygen as tolerated to baseline 3 L/min or less --strict I/O's, daily weights  --Low sodium diet and fluid restriction 1200 cc/day  COPD with Acute Exacerbation - present on admission, and continues with diffuse wheezing on exam.  Had received IV steroids earlier this admission which had been stopped or expired.   Resumed Solu-medrol 60 mg IV q12h on 8/25 - continue.   Scheduled Duonebs q6h while awake, PRN albuterol.   Bipap overnight.   Z-pack.  Hyponatremia - chronic and close to baseline.  Multifactorial - patient takes antipsychotics, presented volume overloaded with CHF, and has psychogenic polydipsia.   --Continue Lasix as above --Fluid restriction! --BMP's to monitor --Consider nephrology consult if worsening  Schizoaffective disorder - Continue home Zyprexa, Depakote, Latuda, Wellbutrin, Klonopin  Hypertension - chronic, poorly controlled.  Continue Metoprolol and Nifedipine.  Control pain and anxiety as well.  Nicotine Dependence - Smoking cessation discussed and advised.  Nicotine patch 21  mg daily.  Obesity: Body mass index is 37.19 kg/m.  Complicates overall care and prognosis.  DVT prophylaxis: Place and maintain sequential compression device Start: 02/15/20 1354 enoxaparin (LOVENOX) injection 40 mg Start: 02/13/20 2200   Diet:  Diet Orders (From admission, onward)    Start     Ordered   02/17/20 1125  Diet heart healthy/carb modified Room service appropriate? Yes; Fluid consistency: Thin; Fluid restriction: 1200 mL Fluid  Diet effective now       Question Answer Comment  Diet-HS Snack? Nothing   Room service appropriate? Yes   Fluid consistency: Thin   Fluid restriction: 1200 mL Fluid      02/17/20 1124            Code Status: Full Code    Subjective 02/18/20    Patient seen at bedside this AM.  She is upset about her anxiety medications, thinks they are being given differently than at SNF.  Says she feels a little better, but still wheezy.  No fever/chills or other acute complaints.    Disposition Plan & Communication   Status is: Inpatient  Remains inpatient appropriate because:IV treatments appropriate due to intensity of illness or inability to take PO.  Remains on IV steroids  for COPD exacerbation at this time.   Dispo: The patient is from: SNF              Anticipated d/c is to: SNF              Anticipated d/c date is: 1-2 days              Patient currently is not medically stable to d/c.   Family Communication: none at bedside, will attempt to call.    Consults, Procedures, Significant Events   Consultants:   none  Procedures:   none   Objective   Vitals:   02/17/20 2013 02/18/20 0210 02/18/20 0446 02/18/20 0722  BP:   (!) 142/74 125/79  Pulse:   63 62  Resp:    18  Temp:   97.7 F (36.5 C) 98.1 F (36.7 C)  TempSrc:   Oral   SpO2: 98% 97% 99% 99%  Weight:   101.4 kg   Height:        Intake/Output Summary (Last 24 hours) at 02/18/2020 0740 Last data filed at 02/18/2020 7124 Gross per 24 hour  Intake 480 ml    Output 4000 ml  Net -3520 ml   Filed Weights   02/15/20 0459 02/16/20 0459 02/18/20 0446  Weight: 100.1 kg 102.6 kg 101.4 kg    Physical Exam:  General exam: awake, alert, no acute distress, obese Respiratory system: poor aeration, still with diffuse expiratory wheezes, normal respiratory effort, on 2 L/min oxygen by [place] Cardiovascular system: normal S1/S2, RRR, no pedal edema.   Central nervous system: A&O x3. no gross focal neurologic deficits, normal speech Extremities: moves all, no cyanosis, normal tone Psychiatry: anxious mood, congruent affect  Labs   Data Reviewed: I have personally reviewed following labs and imaging studies  CBC: Recent Labs  Lab 02/13/20 0744 02/15/20 0452 02/16/20 0506  WBC 9.0 8.4 9.4  NEUTROABS  --  7.3  --   HGB 10.9* 10.2* 11.2*  HCT 33.8* 32.8* 35.2*  MCV 89.2 90.9 89.6  PLT 200 212 580   Basic Metabolic Panel: Recent Labs  Lab 02/13/20 0744 02/14/20 0509 02/15/20 0452 02/16/20 0505 02/17/20 0414  NA 128* 127* 128* 127* 126*  K 4.3 4.2 4.8 5.0 4.6  CL 84* 84* 84* 81* 80*  CO2 33* 34* 35* 36* 36*  GLUCOSE 113* 174* 168* 146* 226*  BUN 13 23* 33* 30* 36*  CREATININE 0.71 0.80 0.75 0.60 0.81  CALCIUM 9.0 8.7* 9.0 8.9 9.1  MG  --   --  2.0  --   --    GFR: Estimated Creatinine Clearance: 89.4 mL/min (by C-G formula based on SCr of 0.81 mg/dL). Liver Function Tests: Recent Labs  Lab 02/15/20 0452  AST 11*  ALT 15  ALKPHOS 69  BILITOT 0.5  PROT 7.2  ALBUMIN 3.6   Recent Labs  Lab 02/13/20 0744  LIPASE 27   No results for input(s): AMMONIA in the last 168 hours. Coagulation Profile: No results for input(s): INR, PROTIME in the last 168 hours. Cardiac Enzymes: No results for input(s): CKTOTAL, CKMB, CKMBINDEX, TROPONINI in the last 168 hours. BNP (last 3 results) No results for input(s): PROBNP in the last 8760 hours. HbA1C: No results for input(s): HGBA1C in the last 72 hours. CBG: No results for input(s):  GLUCAP in the last 168 hours. Lipid Profile: No results for input(s): CHOL, HDL, LDLCALC, TRIG, CHOLHDL, LDLDIRECT in the last 72 hours. Thyroid Function Tests: No results for  input(s): TSH, T4TOTAL, FREET4, T3FREE, THYROIDAB in the last 72 hours. Anemia Panel: No results for input(s): VITAMINB12, FOLATE, FERRITIN, TIBC, IRON, RETICCTPCT in the last 72 hours. Sepsis Labs: No results for input(s): PROCALCITON, LATICACIDVEN in the last 168 hours.  Recent Results (from the past 240 hour(s))  SARS Coronavirus 2 by RT PCR (hospital order, performed in [facility] hospital lab) Nasopharyngeal Nasopharyngeal Swab     Status: None   Collection Time: 02/13/20  2:21 PM   Specimen: Nasopharyngeal Swab  Result Value Ref Range Status   SARS Coronavirus 2 NEGATIVE NEGATIVE Final    Comment: (NOTE) SARS-CoV-2 target nucleic acids are NOT DETECTED.  The SARS-CoV-2 RNA is generally detectable in upper and lower respiratory specimens during the acute phase of infection. The lowest concentration of SARS-CoV-2 viral copies this assay can detect is 250 copies / mL. A negative result does not preclude SARS-CoV-2 infection and should not be used as the sole basis for treatment or other patient management decisions.  A negative result may occur with improper specimen collection / handling, submission of specimen other than nasopharyngeal swab, presence of viral mutation(s) within the areas targeted by this assay, and inadequate number of viral copies (<250 copies / mL). A negative result must be combined with clinical observations, patient history, and epidemiological information.  Fact Sheet for Patients:   StrictlyIdeas.no  Fact Sheet for Healthcare Providers: BankingDealers.co.za  This test is not yet approved or  cleared by the [place] FDA and has been authorized for detection and/or diagnosis of SARS-CoV-2 by FDA under an Emergency Use  Authorization (EUA).  This EUA will remain in effect (meaning this test can be used) for the duration of the COVID-19 declaration under Section 564(b)(1) of the Act, 21 U.S.C. section 360bbb-3(b)(1), unless the authorization is terminated or revoked sooner.  Performed at [facility], [address] [place], [place] [address]   MRSA PCR Screening     Status: None   Collection Time: 02/14/20  5:51 AM   Specimen: Nasopharyngeal  Result Value Ref Range Status   MRSA by PCR NEGATIVE NEGATIVE Final    Comment:        The GeneXpert MRSA Assay (FDA approved for NASAL specimens only), is one component of a comprehensive MRSA colonization surveillance program. It is not intended to diagnose MRSA infection nor to guide or monitor treatment for MRSA infections. Performed at [facility], [address], [place], [place] [address]       Imaging Studies   No results found.   Medications   Scheduled Meds: . ascorbic acid  1,000 mg Oral Daily  . aspirin EC  81 mg Oral Daily  . azithromycin  250 mg Oral Daily  . buPROPion  150 mg Oral Daily  . calcium-vitamin D  1 tablet Oral Daily  . divalproex  250 mg Oral TID  . docusate sodium  100 mg Oral BID  . enoxaparin (LOVENOX) injection  40 mg Subcutaneous Q24H  . fluticasone furoate-vilanterol  1 puff Inhalation Daily  . furosemide  40 mg Oral Daily  . guaiFENesin  600 mg Oral BID  . ipratropium-albuterol  3 mL Nebulization Q6H WA  . loratadine  10 mg Oral Daily  . lurasidone  20 mg Oral BID  . mouth rinse  15 mL Mouth Rinse BID  . melatonin  5 mg Oral QHS  . methylPREDNISolone (SOLU-MEDROL) injection  60 mg Intravenous Q12H  . metoprolol succinate  12.5 mg Oral Daily  .  multivitamin with minerals  1 tablet Oral Daily  . nicotine  21 mg Transdermal Daily  . NIFEdipine  60 mg Oral Daily  . OLANZapine  10 mg Oral BID  . pantoprazole  40 mg Oral Daily  . sodium chloride flush  3 mL Intravenous Q12H  .  sucralfate  1 g Oral TID WC & HS   Continuous Infusions: . sodium chloride         LOS: 5 days    Time spent: 30 minutes with > 50% spent in coordination of care and direct patient contact.    Ezekiel Slocumb, DO [facility]  02/18/2020, 7:40 AM    If 7PM-7AM, please contact night-coverage. How to contact the [facility] Attending or Consulting provider [place] or covering provider during after hours [place], for this patient?    1. Check the care team in [facility] and look for a) attending/consulting TR[facility] provider listed and b) the [facility] team listed 2. Log into [email or web address] and use [place]'s universal password to access. If you do not have the password, please contact the hospital operator. 3. Locate the [facility] provider you are looking for under [facility] and page to a number that you can be directly reached. 4. If you still have difficulty reaching the provider, please page the [facility] (Director on Call) for the Hospitalists listed on amion for assistance.

## 2020-02-18 NOTE — Care Management Important Message (Signed)
Important Message  Patient Details  Name: Natalie Thomas MRN: [id number] Date of Birth: 03-25-1961   Medicare Important Message Given:  Yes     Dannette Barbara 02/18/2020, 1:39 PM

## 2020-02-18 NOTE — TOC Progression Note (Signed)
Transition of Care [facility]) - Progression Note    Patient Details  Name: Natalie Thomas MRN: [id number] Date of Birth: 1960-11-08  Transition of Care [facility]) CM/SW [place], RN Phone Number: 02/18/2020, 12:24 PM  Clinical Narrative:      Patient started back on IV Steroids, will not be discharging today.  Palliative care to follow paitent at [facility] after discharge.  Spoke with Kenney Houseman to let her know patient is not discharging.    Expected Discharge Plan: Long Term Nursing Home Barriers to Discharge: No Barriers Identified  Expected Discharge Plan and Services Expected Discharge Plan: [place]   Discharge Planning Services: CM Consult Post Acute Care Choice: Nursing Home Living arrangements for the past 2 months: [place]                                       Social Determinants of Health (SDOH) Interventions    Readmission Risk Interventions Readmission Risk Prevention Plan 01/25/2020 10/19/2019 09/15/2019  Transportation Screening Complete Complete Complete  Medication Review Press photographer) Complete Complete Complete  PCP or Specialist appointment within 3-5 days of discharge - Complete Complete  HRI or [place] - Complete Complete  HRI or Home Care Consult Pt Refusal Comments - - -  SW Recovery Care/Counseling Consult - - -  Palliative Care Screening Not Applicable - -  Skilled Nursing Facility Complete Complete Complete  Some recent data might be hidden

## 2020-02-19 LAB — RESP PANEL BY RT PCR (RSV, FLU A&B, COVID)
Influenza A by PCR: NEGATIVE
Influenza B by PCR: NEGATIVE
Respiratory Syncytial Virus by PCR: NEGATIVE
SARS Coronavirus 2 by RT PCR: NEGATIVE

## 2020-02-19 LAB — BASIC METABOLIC PANEL
Anion gap: 13 (ref 5–15)
BUN: 38 mg/dL — ABNORMAL HIGH (ref 6–20)
CO2: 33 mmol/L — ABNORMAL HIGH (ref 22–32)
Calcium: 9.2 mg/dL (ref 8.9–10.3)
Chloride: 85 mmol/L — ABNORMAL LOW (ref 98–111)
Creatinine, Ser: 0.73 mg/dL (ref 0.44–1.00)
GFR calc Af Amer: >60 mL/min (ref >60)
GFR calc non Af Amer: >60 mL/min (ref >60)
Glucose, Bld: 195 mg/dL — ABNORMAL HIGH (ref 70–99)
Potassium: 4.6 mmol/L (ref 3.5–5.1)
Sodium: 131 mmol/L — ABNORMAL LOW (ref 135–145)

## 2020-02-19 LAB — MAGNESIUM: Magnesium: 2.3 mg/dL (ref 1.7–2.4)

## 2020-02-19 MED ORDER — LORAZEPAM 0.5 MG PO TABS
0.5000 mg | ORAL_TABLET | Freq: Once | ORAL | Status: AC
  Administered 2020-02-19: 0.5 mg via ORAL
  Filled 2020-02-19: qty 1

## 2020-02-19 MED ORDER — AZITHROMYCIN 250 MG PO TABS: 500.0000 mg | ORAL_TABLET | Freq: Every day | ORAL | 0 refills | Status: AC

## 2020-02-19 MED ORDER — ASPIRIN 81 MG PO TBEC: 81.0000 mg | DELAYED_RELEASE_TABLET | Freq: Every day | ORAL | 0 refills | Status: AC

## 2020-02-19 MED ORDER — SALINE SPRAY 0.65 % NA SOLN
1.0000 | NASAL | Status: DC | PRN
  Filled 2020-02-19: qty 44

## 2020-02-19 MED ORDER — PREDNISONE 10 MG PO TABS: ORAL_TABLET | ORAL | 0 refills | Status: AC

## 2020-02-19 MED ORDER — FUROSEMIDE 40 MG PO TABS: 40.0000 mg | ORAL_TABLET | Freq: Every day | ORAL | Status: AC

## 2020-02-19 MED ORDER — METHYLPREDNISOLONE SODIUM SUCC 125 MG IJ SOLR
60.0000 mg | INTRAMUSCULAR | Status: DC
  Filled 2020-02-19: qty 2

## 2020-02-19 MED ORDER — GUAIFENESIN-DM 100-10 MG/5ML PO SYRP: 5.0000 mL | ORAL_SOLUTION | ORAL | 0 refills | Status: AC | PRN

## 2020-02-19 NOTE — Progress Notes (Signed)
Report called to [facility].  [facility] to arrive around 1400 for transport.  Legal guardian notified by CM/SW.    D/c telemetry.

## 2020-02-19 NOTE — Discharge Summary (Addendum)
Physician Discharge Summary  DARBIE BIANCARDI YTK:[id number] DOB: Jul 09, 1960 DOA: 02/13/2020  PCP: System, Pcp Not In  Admit date: 02/13/2020 Discharge date: 02/19/2020  Admitted From: SNF [facility] Disposition:  SNF [place]  Recommendations for Outpatient Follow-up:  1. Follow up with PCP in 1-2 weeks 2. Please obtain BMP/CBC in one week   Home Health: No  Equipment/Devices: None   Discharge Condition: Stable  CODE STATUS: Full  Diet recommendation: Heart Healthy    Discharge Diagnoses: Principal Problem:   Acute on chronic diastolic CHF (congestive heart failure) (HCC) Active Problems:   DM (diabetes mellitus), type 2 (HCC)   Chronic hyponatremia   Schizoaffective disorder, bipolar type (HCC)   Anxiety and depression   Acute on chronic respiratory failure (HCC)   Acute diastolic (congestive) heart failure (HCC)    Summary of HPI and Hospital Course:  Natalie Thomas is a 59 y.o. female  obese Caucasian female with a known history of  chronic diastolic CHF, schizophrenia, chronic hyponatremia, chronic respiratory failure on 3 L of oxygen, dyslipidemia and psychogenic polydipsia who was sent to the ER from the skilled nursing facility for evaluation of worsening shortness of breath which she has had for the last 3 days.  Also reported cough productive of clear phlegm, wheezing, mild chest tightness.  Reported she checked her pulse ox on room air and it was in the 60s and so she notified the nursing home staff and requested to come to the ER for evaluation.   In the ED, increased oxygen requirement and was on 6 L/min to maintain O2 sats.   Vital signs showed significant elevated blood pressure with SBP>290mmHg, RR of 26, afebrile.  Treated with Lasix and bronchodilator therapy and was able to be weaned down to 3 to 4 L/min oxygen.    Labs reveal sodium of 128, potassium 4.3, bicarb 33, glucose 113, BUN 13, creatinine 0.71, BNP 732, troponin 22 >> 25,  white count 9.0, hemoglobin 10.9.  Chest x-ray showed cardiomegaly and small bilateral pleural effusions.  Admitted to hospitalist service for further management of acute on chronic respiratory failure with hypoxia secondary CHF decompensation and COPD exacerbation.  Impression from 2D Echo in April 2021:  1. Left ventricular ejection fraction, by estimation, is 50 to 55%. The left ventricle has low normal function. The left ventricle demonstrates global hypokinesis. Left ventricular diastolic parameters are consistent with Grade I diastolic dysfunction (impaired relaxation). 2. Right ventricular systolic function is normal. The right ventricular size is normal. 3. The mitral valve is normal in structure. Mild to moderate mitral valve regurgitation. No evidence of mitral stenosis. 4. The aortic valve is normal in structure. Aortic valve regurgitation is not visualized. No aortic stenosis is present. 5. The inferior vena cava is normal in size with greater than 50% respiratory variability, suggesting right atrial pressure of 3 mmHg.    Acute on chronic respiratory failure with hypoxia and hypercapnea - due to COPD and CHF.  Baseline uses 3 L/min.  Hypercapnia improved after Bipap overnight, but after first night, patient did not tolerate Bipap and refused it.      Acute on chronic diastolic dysfunction CHF - presented with worsening shortness of breath, pulse oximetry at rest on room air was in the 60s at home prior to arrival, required 6 L/min supplemental O2 to maintain sats.  Prior echo showed EF was 50 to 55%.  Chest xray with bilateral pleural effusions.  Presentation and imaging consistent with acutely decompensated CHF. Improved with  IV diuresis.   Good urine outputs:  - 3 L on 8/22 / 4.6 L on 8/23 / 2.9 L on 8/24 / 3.5 L on 8/25 Now euvolemic on exam.  IV Lasix stopped on 8/25. --Continue Lasix to 40 mg PO daily --wean oxygen as tolerated to baseline 3 L/min or less --Continue daily  weights  --Low sodium diet and fluid restriction 1200 cc/day  COPD with Acute Exacerbation - present on admission, with diffuse wheezing on exam.  Improved with IV steroids, more frequent bronchodilators and Zithromax.    --Prednisone taper over 4 days: 40 / 30 / 20 / 10 mg, 1 day each --3 more days Zithromax --Continue her bronchodilators  Hyponatremia - chronic and at her baseline.  Multifactorial - patient takes antipsychotics, presented volume overloaded with CHF, and also has psychogenic polydipsia.   --Continue Lasix as above --Fluid restriction!  Schizoaffective disorder - Continue Zyprexa, Depakote, Latuda, Wellbutrin, Klonopin  Hypertension - chronic, poorly controlled.  Continue Metoprolol and Nifedipine.  Control pain and anxiety as well.  Nicotine Dependence - Smoking cessation discussed and advised.  Nicotine patch 21 mg daily.  On Wellbutrin.  Obesity: Body mass index is 37.19 kg/m.  Complicates overall care and prognosis.  Palliative care to follow patient at [facility] after discharge.   Discharge Instructions   Discharge Instructions    (HEART FAILURE PATIENTS) Call MD:  Anytime you have any of the following symptoms: 1) 3 pound weight gain in 24 hours or 5 pounds in 1 week 2) shortness of breath, with or without a dry hacking cough 3) swelling in the hands, feet or stomach 4) if you have to sleep on extra pillows at night in order to breathe.   Complete by: As directed    Call MD for:   Complete by: As directed    Worsening shortness of breath or wheezing, excessive somnolence / lethargy   Call MD for:  extreme fatigue   Complete by: As directed    Call MD for:  persistant dizziness or light-headedness   Complete by: As directed    Call MD for:  temperature >100.4   Complete by: As directed    Diet - low sodium heart healthy   Complete by: As directed    Discharge instructions   Complete by: As directed    Take prednisone taper over the next 4 days,  starting tomorrow morning.   Please take azithromycin for 3 more days as well.   Increase activity slowly   Complete by: As directed      Allergies as of 02/19/2020      Reactions   Clarithromycin Itching   Penicillins Itching   Has tolerated cefazolin before Has patient had a PCN reaction causing immediate rash, facial/tongue/throat swelling, SOB or lightheadedness with hypotension: No Has patient had a PCN reaction causing severe rash involving mucus membranes or skin necrosis: No Has patient had a PCN reaction that required hospitalization: Unknown Has patient had a PCN reaction occurring within the last 10 years: No If all of the above answers are "NO", then may proceed with Cephalosporin use.      Medication List    TAKE these medications   acetaminophen 500 MG tablet Commonly known as: TYLENOL Take 1,000 mg by mouth every 8 (eight) hours as needed for mild pain or moderate pain. Not to exceed 3g/24h of tylenol from all sources.   albuterol 108 (90 Base) MCG/ACT inhaler Commonly known as: VENTOLIN HFA Inhale 2 puffs into the lungs  every 6 (six) hours as needed for wheezing or shortness of breath.   albuterol (2.5 MG/3ML) 0.083% nebulizer solution Commonly known as: PROVENTIL Take 3 mLs (2.5 mg total) by nebulization every 4 (four) hours as needed for wheezing or shortness of breath.   ANTACID LIQUID PO Take 30 mLs by mouth every 6 (six) hours as needed (heartburn).   ascorbic acid 1000 MG tablet Commonly known as: VITAMIN C Take 1 tablet (1,000 mg total) by mouth daily.   aspirin 81 MG EC tablet Take 1 tablet (81 mg total) by mouth daily.   azithromycin 250 MG tablet Commonly known as: ZITHROMAX Take 2 tablets (500 mg total) by mouth daily for 3 days. Take 1 tablet daily for 3 days.   Breo Ellipta 100-25 MCG/INH Aepb Generic drug: fluticasone furoate-vilanterol Inhale 1 puff into the lungs daily.   buPROPion 150 MG 12 hr tablet Commonly known as: WELLBUTRIN  SR Take 150 mg by mouth 2 (two) times daily. What changed: Another medication with the same name was removed. Continue taking this medication, and follow the directions you see here.   Calcium Carb-Cholecalciferol 600-800 MG-UNIT Tabs Take 1 tablet by mouth daily.   clonazePAM 0.5 MG tablet Commonly known as: KLONOPIN Take 0.5 mg by mouth 2 (two) times daily.   divalproex 250 MG DR tablet Commonly known as: DEPAKOTE Take 250 mg by mouth 3 (three) times daily.   docusate sodium 100 MG capsule Commonly known as: COLACE Take 100 mg by mouth every 12 (twelve) hours as needed for mild constipation or moderate constipation.   furosemide 40 MG tablet Commonly known as: LASIX Take 1 tablet (40 mg total) by mouth daily. Start taking on: February 20, 2020 What changed:   medication strength  how much to take  when to take this   guaiFENesin 100 MG/5ML Soln Commonly known as: ROBITUSSIN Take 15 mLs by mouth every 4 (four) hours as needed for cough or to loosen phlegm.   guaiFENesin-dextromethorphan 100-10 MG/5ML syrup Commonly known as: ROBITUSSIN DM Take 5 mLs by mouth every 4 (four) hours as needed for cough.   hydrOXYzine 25 MG tablet Commonly known as: ATARAX/VISTARIL Take 25 mg by mouth every 6 (six) hours as needed for anxiety or itching.   ipratropium-albuterol 0.5-2.5 (3) MG/3ML Soln Commonly known as: DUONEB Take 3 mLs by nebulization 3 (three) times daily.   loratadine 10 MG tablet Commonly known as: CLARITIN Take 10 mg by mouth daily.   LORazepam 1 MG tablet Commonly known as: ATIVAN Take 1 mg by mouth every 4 (four) hours as needed for anxiety.   lurasidone 20 MG Tabs tablet Commonly known as: LATUDA Take 1 tablet (20 mg total) by mouth 2 (two) times daily.   magnesium hydroxide 400 MG/5ML suspension Commonly known as: MILK OF MAGNESIA Take 30 mLs by mouth daily as needed for mild constipation or moderate constipation.   Melatonin 10 MG Tabs Take 10 mg  by mouth at bedtime.   metoprolol succinate 25 MG 24 hr tablet Commonly known as: TOPROL-XL Take 0.5 tablets (12.5 mg total) by mouth daily.   multivitamin with minerals Tabs tablet Take 1 tablet by mouth daily.   nicotine 21 mg/24hr patch Commonly known as: NICODERM CQ - dosed in mg/24 hours Place 21 mg onto the skin daily.   NIFEdipine 60 MG 24 hr tablet Commonly known as: ADALAT CC Take 1 tablet (60 mg total) by mouth daily.   nystatin powder Generic drug: nystatin Apply 1 application topically  2 (two) times daily as needed (irritation). Apply topically to reddened area twice daily as needed for irritation   OLANZapine 5 MG tablet Commonly known as: ZYPREXA Take 10 mg by mouth 2 (two) times daily.   ondansetron 8 MG tablet Commonly known as: ZOFRAN Take 8 mg by mouth every 6 (six) hours as needed for nausea or vomiting.   pantoprazole 40 MG tablet Commonly known as: PROTONIX Take 40 mg by mouth daily.   polyethylene glycol 17 g packet Commonly known as: MiraLax Take 17 g by mouth daily. What changed: additional instructions   predniSONE 10 MG tablet Commonly known as: DELTASONE Take 4 tablets (40 mg total) by mouth daily for 1 day, THEN 3 tablets (30 mg total) daily for 1 day, THEN 2 tablets (20 mg total) daily for 1 day, THEN 1 tablet (10 mg total) daily for 1 day. Start taking on: February 19, 2020   sodium chloride 0.65 % Soln nasal spray Commonly known as: OCEAN Place 2 sprays into both nostrils every 2 (two) hours as needed (dryness, irritation).   sucralfate 1 GM/10ML suspension Commonly known as: CARAFATE Take 10 mLs (1 g total) by mouth 4 (four) times daily -  with meals and at bedtime.       Follow-up Information    [facility] Follow up on 02/25/2020.   Specialty: Cardiology Why: at 2:30pm. Enter through the [facility] entrance Contact information: [place]  [place]             Allergies  Allergen Reactions  . Clarithromycin Itching  . Penicillins Itching    Has tolerated cefazolin before  Has patient had a PCN reaction causing immediate rash, facial/tongue/throat swelling, SOB or lightheadedness with hypotension: No Has patient had a PCN reaction causing severe rash involving mucus membranes or skin necrosis: No Has patient had a PCN reaction that required hospitalization: Unknown Has patient had a PCN reaction occurring within the last 10 years: No If all of the above answers are "NO", then may proceed with Cephalosporin use.     Consultations:  None    Procedures/Studies: DG Chest 1 View  Result Date: 02/15/2020 CLINICAL DATA:  Shortness of breath EXAM: CHEST  1 VIEW COMPARISON:  02/13/2020 FINDINGS: No substantial change. Persistent interstitial prominence with pulmonary vascular congestion. Stable mild cardiomegaly. No pleural effusion or pneumothorax. IMPRESSION: No substantial change since 02/13/2019. Pulmonary vascular congestion and mild cardiomegaly. Electronically Signed   By: Macy Mis M.D.   On: 02/15/2020 10:32   DG Chest 1 View  Result Date: 02/13/2020 CLINICAL DATA:  Short of breath, chest pain, abdominal pain, symptoms for 4 days EXAM: CHEST  1 VIEW COMPARISON:  02/13/2020 at 8:55 a.m. FINDINGS: Two frontal views of the chest demonstrates stable enlargement of the cardiac silhouette. Stable central vascular congestion without airspace disease. Trace bilateral effusions unchanged. No pneumothorax. No acute bony abnormalities. IMPRESSION: 1. Stable vascular congestion and trace bilateral pleural effusions. No overt edema. Electronically Signed   By: Randa Ngo M.D.   On: 02/13/2020 20:55   DG Chest 2 View  Result Date: 02/13/2020 CLINICAL DATA:  Shortness of breath. EXAM: CHEST - 2 VIEW COMPARISON:  January 30, 2020 FINDINGS: Stable cardiomegaly. The hila and mediastinum are unchanged. No  pneumothorax. Small bilateral pleural effusions with underlying atelectasis. Mild pulmonary venous congestion not excluded. No overt edema. IMPRESSION: 1. Cardiomegaly and small bilateral pleural effusions. Mild pulmonary venous  congestion, particularly on the left, not excluded. No overt edema. 2. No other acute abnormalities. Electronically Signed   By: Dorise Bullion III M.D   On: 02/13/2020 09:38   CT ABDOMEN PELVIS W CONTRAST  Result Date: 01/24/2020 CLINICAL DATA:  Abdominal pain EXAM: CT ABDOMEN AND PELVIS WITH CONTRAST TECHNIQUE: Multidetector CT imaging of the abdomen and pelvis was performed using the standard protocol following bolus administration of intravenous contrast. CONTRAST:  148mL OMNIPAQUE IOHEXOL 300 MG/ML  SOLN COMPARISON:  CT 07/01/2019 FINDINGS: Lower chest: Trace left pleural effusion with associated atelectasis in the left lung base. Right basilar atelectasis noted as well. Mild cardiomegaly with coronary artery calcifications and a small pericardial effusion. Hepatobiliary: No focal liver abnormality is seen. Patient is post cholecystectomy. Slight prominence of the biliary tree likely related to reservoir effect. No calcified intraductal gallstones. Pancreas: Unremarkable. No pancreatic ductal dilatation or surrounding inflammatory changes. Spleen: Normal in size. No concerning splenic lesions. Adrenals/Urinary Tract: Normal adrenal glands. Kidneys enhance and excrete symmetrically. Few subcentimeter hypoattenuating foci too small to fully characterize on CT imaging but statistically likely benign. Stable mild bilateral perinephric stranding, nonspecific. No concerning renal mass. Bilateral nonobstructing nephrolithiasis as well as vascular calcifications. Scarring seen in the interpolar right kidney. No obstructive urolithiasis or hydronephrosis. Mild bladder distention with circumferential bladder wall thickening and indentation of the bladder base by an enlarged prostate.  Stomach/Bowel: Small sliding-type hiatal hernia with fluid in the distal thoracic esophagus, correlate for reflux. Stomach and duodenum are unremarkable. No proximal small bowel thickening or dilatation. Loops of small bowel protrude into a fat and bowel containing umbilical hernia. No resulting mechanical obstruction or evidence of vascular compromise at this time. No colonic dilatation or wall thickening. Scattered colonic diverticula without focal inflammation to suggest diverticulitis. Vascular/Lymphatic: Extensive atherosclerotic calcifications within the abdominal aorta and branch vessels including exuberant calcification in the mid abdominal aorta below the renal artery origins which may result in some high-grade stenosis (2/30). Additional plaque narrowing of the celiac, SMA, renal artery and IMA origins is likely present though incompletely assessed on this non angiographic technique. No aneurysm or ectasia. No enlarged abdominopelvic lymph nodes. Reproductive: Anteverted uterus.  No concerning adnexal lesions. Other: Fat and bowel containing umbilical hernia increased in size from comparison without evidence of resulting mechanical obstruction or vascular compromise at this time. Mild edematous changes of the bilateral flanks. Soft tissue nodules in the subcutaneous fat of the anterolateral abdominal wall may reflect injectable use. Musculoskeletal: Multilevel degenerative changes are present in the imaged portions of the spine. Unchanged compression deformities at T11 and L1 as well as the superior endplate L3. Exaggerated lumbar lordosis similar to comparison. Hyperdense changes at the SI joints may reflect augmentation of prior insufficiency fractures. 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 Additional remote posttraumatic deformities of the right superior pubic ramus. IMPRESSION: 1. Fat  and bowel containing umbilical hernia increased in size from comparison without evidence of resulting mechanical obstruction or vascular compromise at this time. 2. Mild circumferential bladder wall thickening and bladder distension. Correlate for urinary retention symptoms and consider urinalysis to exclude cystitis. 3. Small sliding-type hiatal hernia with fluid in the distal thoracic esophagus, correlate for reflux. 4. Trace left pleural effusion with associated atelectasis in the left lung base. 5. Cardiomegaly and coronary atherosclerosis with stable small pericardial effusion. 6. 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 7. Remote compression deformities T11, L1. Likely augmentation of remote bilateral sacral insufficiency fractures. Correlate with procedure history. 8. Aortic Atherosclerosis (ICD10-I70.0). Electronically Signed   By: Lovena Le M.D.   On: 01/24/2020 21:44   DG Chest Port 1 View  Result Date: 01/30/2020 CLINICAL DATA:  Abdominal pain, dyspnea EXAM: PORTABLE CHEST 1 VIEW COMPARISON:  01/24/2020 FINDINGS: Cardiomegaly. Unchanged diffuse bilateral interstitial pulmonary opacity. The visualized skeletal structures are unremarkable. IMPRESSION: 1. Unchanged diffuse bilateral interstitial pulmonary opacity, most consistent with edema. No focal airspace opacity. 2. Cardiomegaly. Electronically Signed   By: Eddie Candle M.D.   On: 01/30/2020 16:56   DG Chest Portable 1 View  Result Date: 01/24/2020 CLINICAL DATA:  Chest pain.  Abdominal pain today. EXAM: PORTABLE CHEST 1 VIEW COMPARISON:  Chest radiograph 10/18/2019 FINDINGS: Chronic cardiomegaly with slight improvement from prior exam. Aortic atherosclerosis. Vascular congestion. Improved pulmonary edema from prior exam. There is mild central peribronchial thickening. No large pleural effusion. No evidence of pneumothorax or focal airspace  disease. IMPRESSION: Chronic cardiomegaly with vascular congestion. Improved pulmonary edema from prior exam. Mild central peribronchial thickening may be bronchitic or congestive. Electronically Signed   By: Keith Rake M.D.   On: 01/24/2020 18:59       Subjective: Patient tearful when seen this morning.  Asks "what's going to happen to me today?"  Said she could not tolerate Bipap overnight, unable to sleep and makes her more anxious.  Breathing is better.  No fever/chills, CP, SOB at rest, N/V/D or other acute complaints.   Discharge Exam: Vitals:   02/19/20 0816 02/19/20 1150  BP: (!) 150/92 (!) 149/93  Pulse: 84 65  Resp: 19 19  Temp: 97.6 F (36.4 C) 97.7 F (36.5 C)  SpO2: 96% 100%   Vitals:   02/19/20 0406 02/19/20 0740 02/19/20 0816 02/19/20 1150  BP: 120/69  (!) 150/92 (!) 149/93  Pulse: 67  84 65  Resp: 16  19 19   Temp: (!) 97.5 F (36.4 C)  97.6 F (36.4 C) 97.7 F (36.5 C)  TempSrc: Oral  Oral Oral  SpO2: 96% 97% 96% 100%  Weight: 101.6 kg     Height:        General: Pt is alert, awake, not in acute distress Cardiovascular: RRR, S1/S2 +, no rubs, no gallops Respiratory: improved aeration and minimal intermittent expiratory wheezes today, no rhonchi Abdominal: Soft, NT, ND, bowel sounds + Extremities: no edema, no cyanosis    The results of significant diagnostics from this hospitalization (including imaging, microbiology, ancillary and laboratory) are listed below for reference.     Microbiology: Recent Results (from the past 240 hour(s))  SARS Coronavirus 2 by RT PCR (hospital order, performed in [facility] hospital lab) Nasopharyngeal Nasopharyngeal Swab     Status: None   Collection Time: 02/13/20  2:21 PM   Specimen: Nasopharyngeal Swab  Result Value Ref Range Status   SARS Coronavirus 2 NEGATIVE NEGATIVE Final    Comment: (NOTE) SARS-CoV-2 target nucleic acids are NOT DETECTED.  The SARS-CoV-2 RNA is generally detectable in upper and  lower respiratory specimens during the acute phase of infection. The lowest concentration of SARS-CoV-2 viral copies this assay can detect is 250 copies / mL. A negative result does not preclude SARS-CoV-2 infection and should not be used as the sole basis for treatment or other patient management decisions.  A negative result may occur with improper specimen collection / handling, submission of specimen other  than nasopharyngeal swab, presence of viral mutation(s) within the areas targeted by this assay, and inadequate number of viral copies (<250 copies / mL). A negative result must be combined with clinical observations, patient history, and epidemiological information.  Fact Sheet for Patients:   StrictlyIdeas.no  Fact Sheet for Healthcare Providers: BankingDealers.co.za  This test is not yet approved or  cleared by the [place] FDA and has been authorized for detection and/or diagnosis of SARS-CoV-2 by FDA under an Emergency Use Authorization (EUA).  This EUA will remain in effect (meaning this test can be used) for the duration of the COVID-19 declaration under Section 564(b)(1) of the Act, 21 U.S.C. section 360bbb-3(b)(1), unless the authorization is terminated or revoked sooner.  Performed at [facility], [place], [address] [place], [place] [address]   MRSA PCR Screening     Status: None   Collection Time: 02/14/20  5:51 AM   Specimen: Nasopharyngeal  Result Value Ref Range Status   MRSA by PCR NEGATIVE NEGATIVE Final    Comment:        The GeneXpert MRSA Assay (FDA approved for NASAL specimens only), is one component of a comprehensive MRSA colonization surveillance program. It is not intended to diagnose MRSA infection nor to guide or monitor treatment for MRSA infections. Performed at [facility], [address] [place], [place] [address]      Labs: BNP (last 3 results) Recent Labs     10/01/19 1519 10/18/19 2002 02/13/20 0744  BNP 800.0* 580.0* 253.6*   Basic Metabolic Panel: Recent Labs  Lab 02/15/20 0452 02/16/20 0505 02/17/20 0414 02/18/20 0734 02/19/20 0648  NA 128* 127* 126* 127* 131*  K 4.8 5.0 4.6 4.5 4.6  CL 84* 81* 80* 85* 85*  CO2 35* 36* 36* 33* 33*  GLUCOSE 168* 146* 226* 189* 195*  BUN 33* 30* 36* 33* 38*  CREATININE 0.75 0.60 0.81 0.85 0.73  CALCIUM 9.0 8.9 9.1 9.1 9.2  MG 2.0  --   --   --  2.3   Liver Function Tests: Recent Labs  Lab 02/15/20 0452  AST 11*  ALT 15  ALKPHOS 69  BILITOT 0.5  PROT 7.2  ALBUMIN 3.6   Recent Labs  Lab 02/13/20 0744  LIPASE 27   No results for input(s): AMMONIA in the last 168 hours. CBC: Recent Labs  Lab 02/13/20 0744 02/15/20 0452 02/16/20 0506  WBC 9.0 8.4 9.4  NEUTROABS  --  7.3  --   HGB 10.9* 10.2* 11.2*  HCT 33.8* 32.8* 35.2*  MCV 89.2 90.9 89.6  PLT 200 212 263   Cardiac Enzymes: No results for input(s): CKTOTAL, CKMB, CKMBINDEX, TROPONINI in the last 168 hours. BNP: Invalid input(s): POCBNP CBG: No results for input(s): GLUCAP in the last 168 hours. D-Dimer No results for input(s): DDIMER in the last 72 hours. Hgb A1c No results for input(s): HGBA1C in the last 72 hours. Lipid Profile Recent Labs    02/18/20 0734  CHOL 158  HDL 59  LDLCALC 70  TRIG 145  CHOLHDL 2.7   Thyroid function studies No results for input(s): TSH, T4TOTAL, T3FREE, THYROIDAB in the last 72 hours.  Invalid input(s): FREET3 Anemia work up No results for input(s): VITAMINB12, FOLATE, FERRITIN, TIBC, IRON, RETICCTPCT in the last 72 hours. Urinalysis    Component Value Date/Time   COLORURINE COLORLESS (A) 01/24/2020 1920   APPEARANCEUR CLEAR (A) 01/24/2020 1920   APPEARANCEUR Clear 06/22/2012 0618   LABSPEC 1.003 (L) 01/24/2020 1920  LABSPEC 1.003 06/22/2012 0618   PHURINE 8.0 01/24/2020 1920   GLUCOSEU NEGATIVE 01/24/2020 1920   GLUCOSEU Negative 06/22/2012 0618   GLUCOSEU NEGATIVE  04/09/2011 1058   HGBUR NEGATIVE 01/24/2020 1920   BILIRUBINUR NEGATIVE 01/24/2020 1920   BILIRUBINUR Negative 06/22/2012 0618   KETONESUR NEGATIVE 01/24/2020 1920   PROTEINUR NEGATIVE 01/24/2020 1920   UROBILINOGEN 1.0 08/18/2011 1805   NITRITE NEGATIVE 01/24/2020 1920   LEUKOCYTESUR TRACE (A) 01/24/2020 1920   LEUKOCYTESUR Trace 06/22/2012 0618   Sepsis Labs Invalid input(s): PROCALCITONIN,  WBC,  LACTICIDVEN Microbiology Recent Results (from the past 240 hour(s))  SARS Coronavirus 2 by RT PCR (hospital order, performed in [facility] hospital lab) Nasopharyngeal Nasopharyngeal Swab     Status: None   Collection Time: 02/13/20  2:21 PM   Specimen: Nasopharyngeal Swab  Result Value Ref Range Status   SARS Coronavirus 2 NEGATIVE NEGATIVE Final    Comment: (NOTE) SARS-CoV-2 target nucleic acids are NOT DETECTED.  The SARS-CoV-2 RNA is generally detectable in upper and lower respiratory specimens during the acute phase of infection. The lowest concentration of SARS-CoV-2 viral copies this assay can detect is 250 copies / mL. A negative result does not preclude SARS-CoV-2 infection and should not be used as the sole basis for treatment or other patient management decisions.  A negative result may occur with improper specimen collection / handling, submission of specimen other than nasopharyngeal swab, presence of viral mutation(s) within the areas targeted by this assay, and inadequate number of viral copies (<250 copies / mL). A negative result must be combined with clinical observations, patient history, and epidemiological information.  Fact Sheet for Patients:   StrictlyIdeas.no  Fact Sheet for Healthcare Providers: BankingDealers.co.za  This test is not yet approved or  cleared by the [place] FDA and has been authorized for detection and/or diagnosis of SARS-CoV-2 by FDA under an Emergency Use Authorization (EUA).  This  EUA will remain in effect (meaning this test can be used) for the duration of the COVID-19 declaration under Section 564(b)(1) of the Act, 21 U.S.C. section 360bbb-3(b)(1), unless the authorization is terminated or revoked sooner.  Performed at [facility], [address] [place], [place] [address]   MRSA PCR Screening     Status: None   Collection Time: 02/14/20  5:51 AM   Specimen: Nasopharyngeal  Result Value Ref Range Status   MRSA by PCR NEGATIVE NEGATIVE Final    Comment:        The GeneXpert MRSA Assay (FDA approved for NASAL specimens only), is one component of a comprehensive MRSA colonization surveillance program. It is not intended to diagnose MRSA infection nor to guide or monitor treatment for MRSA infections. Performed at [facility], [address] [place], [place] [address]      Time coordinating discharge: Over 30 minutes  SIGNED:   Ezekiel Slocumb, DO [facility] 02/19/2020, 1:02 PM   If 7PM-7AM, please contact night-coverage [email or web address]

## 2020-02-19 NOTE — TOC Transition Note (Signed)
Transition of Care [facility]) - CM/SW Discharge Note   Patient Details  Name: Natalie Thomas MRN: [id number] Date of Birth: 13-Nov-1960  Transition of Care [facility]) CM/SW Contact:  Victorino Dike, RN Phone Number: 02/19/2020, 12:45 PM   Clinical Narrative:     Patient would like a different long term facility, passed this information onto [facility] so they can work with her to find new placement. Patient to transfer to [facility].     Final next level of care: Long Term Nursing Home Barriers to Discharge: No Barriers Identified   Patient Goals and CMS Choice Patient states their goals for this hospitalization and ongoing recovery are:: return to [name] Medicare.gov Compare Post Acute Care list provided to:: Legal Guardian Choice offered to / list presented to : [name] POA / [name]  Discharge Placement              Patient chooses bed at: [facility] Patient to be transferred to facility by: [facility] Name of family member notified: patient Patient and family notified of of transfer: 02/19/20  Discharge Plan and Services   Discharge Planning Services: CM Consult Post Acute Care Choice: Nursing Home                               Social Determinants of Health (SDOH) Interventions     Readmission Risk Interventions Readmission Risk Prevention Plan 01/25/2020 10/19/2019 09/15/2019  Transportation Screening Complete Complete Complete  Medication Review Press photographer) Complete Complete Complete  PCP or Specialist appointment within 3-5 days of discharge - Complete Complete  HRI or Home Care Consult - Complete Complete  HRI or Home Care Consult Pt Refusal Comments - - -  SW Recovery Care/Counseling Consult - - -  Palliative Care Screening Not Applicable - -  Skilled Nursing Facility Complete Complete Complete  Some recent data might be hidden

## 2020-02-19 NOTE — Plan of Care (Signed)
  Problem: Education: Goal: Knowledge of General Education information will improve Description: Including pain rating scale, medication(s)/side effects and non-pharmacologic comfort measures Outcome: Progressing   Problem: Clinical Measurements: Goal: Ability to maintain clinical measurements within normal limits will improve Outcome: Progressing   

## 2020-02-24 ENCOUNTER — Other Ambulatory Visit: Payer: Self-pay

## 2020-02-24 ENCOUNTER — Non-Acute Institutional Stay: Payer: Medicare Other | Admitting: Nurse Practitioner

## 2020-02-24 ENCOUNTER — Encounter: Payer: Self-pay | Admitting: Nurse Practitioner

## 2020-02-24 VITALS — BP 137/87 | HR 72 | Temp 97.6°F | Wt 210.0 lb

## 2020-02-24 DIAGNOSIS — Z515 Encounter for palliative care: Secondary | ICD-10-CM

## 2020-02-24 DIAGNOSIS — I509 Heart failure, unspecified: Secondary | ICD-10-CM

## 2020-02-24 NOTE — Progress Notes (Signed)
[facility] Consult Note Telephone: [phone]  Fax: [phone]  PATIENT NAME: Natalie Thomas DOB: 1961/04/25 MRN: [id number]  PRIMARY CARE PROVIDER:Dr Yves Dill PROVIDER:Dr Hodges/[place] [phone] or [phone]  RECOMMENDATIONS and PLAN: 1.ACP:DNR in place  2.Palliative care encounter; Palliative medicine team will continue to support patient, patient's family, and medical team. Visit consisted of counseling and education dealing with the complex and emotionally intense issues of symptom management and palliative care in the setting of serious and potentially life-threatening illness  3. F/u 4 weeks for ongoing monitor symptoms anxiety, insomnia, appetite, dyspnea, high re-hospitalization risk, monitor chronic disease progression  I spent 75 minutes providing this consultation,  Start at 10am. More than 50% of the time in this consultation was spent coordinating communication.   HISTORY OF PRESENT ILLNESS:  Natalie Thomas is a 59 y.o. year old female with multiple medical problems including Malignant neoplasm volvulus, polycythemia, diastolic congestive heart failure, history of pleural effusion, chronic respiratory failure with hypoxia and hypercapnia, anemia, COPD, hypertension, diabetes, chronic hyponatremia, hyperlipidemia, gerd, lumbar disc disease, osteoporosis, schizoaffective disorder bipolar type, anxiety, depression, right intramedullary nail intertrochanteric, pelvic surgery for fracture, eye surgery.   Hospitalized 3 / 05/2020 to 3 /16 /2021 for acute on chronic respiratory failure with hypoxia secondary to covid pneumonia and congestive heart failure  Hospitalized 09/13/2019 to 3 / 24 / 2021 for acute on chronic respiratory failure with hypoxia, hypercapnia requiring BiPAP, chronic hyponatremia  Hospitalized 3 / 30 / 2021 to 4 / 3 /  2021 for worsening shortness of breath with work up showing bilateral pleural effusions, pulmonary edema admitted for acute exacerbation of chronic systolic congestive heart failure and bilateral pleural effusions. She didn't undergo a left-sided thoracentesis.  Hospitalized for 01/2020 to 4 / 12 / 2021 for acute on chronic respiratory failure due to acute on chronic systolic and diastolic congestive heart failure  Hospitalized 4 / 25/2021 to 5 / 3 / 2021 for increase work of breathing requiring CPAP then BiPAP and drinking significant amount of water. We're going to significant for acute on chronic hypoxic respiratory failure secondary to congestive heart failure combined Systolic/Diastolic  Hospitalize 8 /10/4268 to 02/02/2020 for nausea, abdominal discomfort, malaise with chest x-ray noted for chronic cardiomegaly and Vascular congestion with improved pulmonary edema from prior study. CT abdomen and pelvis increase fat and foul containing umbilical hernia without any obstruction or vascular compromise. Sodium 125. 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 Chronic abdominal pain. Chronic diastolic heart failure. Chronic hypoxic respiratory failure continued on oxygen. Schizoaffective.  Hospitalized 02/13/2020 to 02/19/2020 for acute on chronic diastolic heart failure, chronic respiratory failure, 6LNC, CM with small pleural effusions, COPD exacerbation, hyponatremia  Ms. Kristensen continues to reside at [facility] Ms. Laine does continue to require assistance with transferring to the wheelchair. Ms. Northrop does require ADL assistance for bathing, dressing with increased shortness of breath and weakness. Ms. Stamour is able to maneuver her wheelchair and be mobile at the facility. Staff endorses Ms. Muma has been taking melatonin for sleep. Staff endorses  Ms. Laser has been asking for her ativan again to be restarted for her anxiety. She was recently 59 / 65 / 2021 start on Levaquin for URI. Staff endorses no other changes are concerns. Ms. Moskal does feed herself with their appetite. At present Ms. Acree  is lying in bed. Ms. Nantz appears chronically ill, 02 dependent but currently comfortable. No visitors present. I visited and observed Ms. Lohmann. We talked about purpose of palliative care visit. We talked about how she was feeling today. We talked about symptoms of pain but she currently denies. We talked about shortness of breath. We talked about o to dependency. We talked about increasing anxiety. Ms. Hyder endorses she would like to have her ativan restarted. Misfires talked about having more trouble sleeping and is requesting her trazodone to be restarted. We talked about melatonin, sleep patterns and sleep hygiene. We talked about risk of ativan and trazodone with chronic respiratory failure. We talked about not medication modalities. We talked about her appetite which has improved. We talked about recent hospitalization. We talked about challenges with residing at skilled facility. Ms. Howley is currently under DSS guardianship. Medical goals are reviewed. We talked about role of palliative care and plan of care. Most of palliative care visit was supportive. No new medication recommendations. Recommendations for sleep hygiene and now medication modalities for anxiety and insomnia. Will fax DSS Guardian notes for further discussion of goals of care though new changes recommended at present time to plan of care. Therapeutic listening and emotional support provided. Contact information. Questions answered to satisfaction.   Palliative Care was asked to help to continue to address goals of care.    CODE STATUS: DNR at facility, remains  PPS: 50% HOSPICE ELIGIBILITY/DIAGNOSIS: TBD  PAST MEDICAL HISTORY:  Past Medical History:  Diagnosis Date  .  Anemia 10/16/2017  . Anxiety and depression   . CHF (congestive heart failure) ([place])   . Chronic hyponatremia 04/08/2007   Qualifier: Diagnosis of  By: Marca Ancona RMA, Lucy    . Closed comminuted intertrochanteric fracture of right femur ([place]) 11/07/2017  . COPD 04/08/2007   Qualifier: Diagnosis of  By: Marca Ancona RMA, Lucy    . Depression   . Diabetes mellitus, type 2 ([place])    pt denies but states that she has been treated for DM  . Essential hypertension 04/08/2007   Qualifier: Diagnosis of  By: Marca Ancona RMA, Lucy    . GERD (gastroesophageal reflux disease)   . Hyperlipidemia 10/07/2017  . Lumbar disc disease 04/09/2011  . MENOPAUSE, PREMATURE 04/08/2007   Qualifier: Diagnosis of  By: Marca Ancona RMA, Lucy    . 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 Osteoporosis 04/08/2007   Qualifier: Diagnosis of  By: Reatha Armour, Lucy    . Polycythemia secondary to smoking 04/09/2011  . 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 Schizoaffective disorder, bipolar type ([place]) 07/05/2011    SOCIAL HX:  Social History   Tobacco Use  . Smoking status: Former Smoker    Packs/day: 0.25    Types: Cigarettes    Quit date: 07/23/2019    Years since quitting: 0.5  . Smokeless tobacco: Never Used  Substance Use Topics  . Alcohol use: No    ALLERGIES:  Allergies  Allergen Reactions  . Clarithromycin Itching  . Penicillins Itching    Has tolerated cefazolin before  Has patient had a PCN reaction causing immediate rash, facial/tongue/throat swelling, SOB or lightheadedness with hypotension: No Has patient had a PCN reaction causing severe rash involving mucus membranes or skin necrosis: No Has patient had a PCN reaction that required hospitalization: Unknown Has patient had a PCN reaction occurring within the last 10 years: No If all of the above answers are "NO",  then may proceed with Cephalosporin use.      PERTINENT MEDICATIONS:  Outpatient Encounter Medications as of  02/24/2020  Medication Sig  . acetaminophen (TYLENOL) 500 MG tablet Take 1,000 mg by mouth every 8 (eight) hours as needed for mild pain or moderate pain. Not to exceed 3g/24h of tylenol from all sources.  Marland Kitchen albuterol (PROVENTIL) (2.5 MG/3ML) 0.083% nebulizer solution Take 3 mLs (2.5 mg total) by nebulization every 4 (four) hours as needed for wheezing or shortness of breath.  Marland Kitchen albuterol (VENTOLIN HFA) 108 (90 Base) MCG/ACT inhaler Inhale 2 puffs into the lungs every 6 (six) hours as needed for wheezing or shortness of breath.  . Alum & Mag Hydroxide-Simeth (ANTACID LIQUID PO) Take 30 mLs by mouth every 6 (six) hours as needed (heartburn).  Marland Kitchen ascorbic acid (VITAMIN C) 1000 MG tablet Take 1 tablet (1,000 mg total) by mouth daily.  Marland Kitchen aspirin 81 MG EC tablet Take 1 tablet (81 mg total) by mouth daily.  Marland Kitchen buPROPion (WELLBUTRIN SR) 150 MG 12 hr tablet Take 150 mg by mouth 2 (two) times daily.  . Calcium Carb-Cholecalciferol 600-800 MG-UNIT TABS Take 1 tablet by mouth daily.   . clonazePAM (KLONOPIN) 0.5 MG tablet Take 0.5 mg by mouth 2 (two) times daily.   . divalproex (DEPAKOTE) 250 MG DR tablet Take 250 mg by mouth 3 (three) times daily.  Marland Kitchen docusate sodium (COLACE) 100 MG capsule Take 100 mg by mouth every 12 (twelve) hours as needed for mild constipation or moderate constipation.   . fluticasone furoate-vilanterol (BREO ELLIPTA) 100-25 MCG/INH AEPB Inhale 1 puff into the lungs daily.  . furosemide (LASIX) 40 MG tablet Take 1 tablet (40 mg total) by mouth daily.  Marland Kitchen guaiFENesin (ROBITUSSIN) 100 MG/5ML SOLN Take 15 mLs by mouth every 4 (four) hours as needed for cough or to loosen phlegm.  Marland Kitchen guaiFENesin-dextromethorphan (ROBITUSSIN DM) 100-10 MG/5ML syrup Take 5 mLs by mouth every 4 (four) hours as needed for cough.  . hydrOXYzine (ATARAX/VISTARIL) 25 MG tablet Take 25 mg by mouth every 6 (six) hours as needed for anxiety or itching.  Marland Kitchen ipratropium-albuterol (DUONEB) 0.5-2.5 (3) MG/3ML SOLN Take 3 mLs by  nebulization 3 (three) times daily.  Marland Kitchen loratadine (CLARITIN) 10 MG tablet Take 10 mg by mouth daily.  Marland Kitchen LORazepam (ATIVAN) 1 MG tablet Take 1 mg by mouth every 4 (four) hours as needed for anxiety.  Marland Kitchen lurasidone (LATUDA) 20 MG TABS tablet Take 1 tablet (20 mg total) by mouth 2 (two) times daily.  . magnesium hydroxide (MILK OF MAGNESIA) 400 MG/5ML suspension Take 30 mLs by mouth daily as needed for mild constipation or moderate constipation.  . Melatonin 10 MG TABS Take 10 mg by mouth at bedtime.   . metoprolol succinate (TOPROL-XL) 25 MG 24 hr tablet Take 0.5 tablets (12.5 mg total) by mouth daily.  . Multiple Vitamin (MULTIVITAMIN WITH MINERALS) TABS tablet Take 1 tablet by mouth daily.  . nicotine (NICODERM CQ - DOSED IN MG/24 HOURS) 21 mg/24hr patch Place 21 mg onto the skin daily.   Marland Kitchen NIFEdipine (ADALAT CC) 60 MG 24 hr tablet Take 1 tablet (60 mg total) by mouth daily.  Marland Kitchen nystatin (NYSTATIN) powder Apply 1 application topically 2 (two) times daily as needed (irritation). Apply topically to reddened area twice daily as needed for irritation  . OLANZapine (ZYPREXA) 5 MG tablet Take 10 mg by mouth 2 (two) times daily.   . ondansetron (ZOFRAN) 8 MG tablet Take 8 mg by mouth  every 6 (six) hours as needed for nausea or vomiting.  . pantoprazole (PROTONIX) 40 MG tablet Take 40 mg by mouth daily.  . polyethylene glycol (MIRALAX) 17 g packet Take 17 g by mouth daily. (Patient taking differently: Take 17 g by mouth daily. Mix 1 capful (17 g) in 4-8 oz of water or juice and give by mouth once daily for bowel regimen)  . sodium chloride (OCEAN) 0.65 % SOLN nasal spray Place 2 sprays into both nostrils every 2 (two) hours as needed (dryness, irritation).  . sucralfate (CARAFATE) 1 GM/10ML suspension Take 10 mLs (1 g total) by mouth 4 (four) times daily -  with meals and at bedtime.   No facility-administered encounter medications on file as of 02/24/2020.    PHYSICAL EXAM:   General: chronically ill, O2  dependent debilitated female Cardiovascular: regular rate and rhythm Pulmonary: few wheezes, decrease bases Neurological: w/c dependent, generalized weakness  Wilber Fini Ihor Gully, NP

## 2020-02-25 ENCOUNTER — Ambulatory Visit: Payer: Medicare Other | Admitting: Family

## 2020-03-12 ENCOUNTER — Emergency Department
Admission: EM | Admit: 2020-03-12 | Discharge: 2020-03-12 | Disposition: A | Discharge status: Skilled Nursing Facility | Payer: Medicare Other | Attending: Emergency Medicine | Admitting: Emergency Medicine

## 2020-03-12 ENCOUNTER — Other Ambulatory Visit: Payer: Self-pay

## 2020-03-12 ENCOUNTER — Emergency Department: Payer: Medicare Other

## 2020-03-12 DIAGNOSIS — I110 Hypertensive heart disease with heart failure: Secondary | ICD-10-CM | POA: Diagnosis not present

## 2020-03-12 DIAGNOSIS — Z7982 Long term (current) use of aspirin: Secondary | ICD-10-CM | POA: Diagnosis not present

## 2020-03-12 DIAGNOSIS — Z7951 Long term (current) use of inhaled steroids: Secondary | ICD-10-CM | POA: Diagnosis not present

## 2020-03-12 DIAGNOSIS — E119 Type 2 diabetes mellitus without complications: Secondary | ICD-10-CM | POA: Insufficient documentation

## 2020-03-12 DIAGNOSIS — J441 Chronic obstructive pulmonary disease with (acute) exacerbation: Secondary | ICD-10-CM | POA: Insufficient documentation

## 2020-03-12 DIAGNOSIS — Z87891 Personal history of nicotine dependence: Secondary | ICD-10-CM | POA: Diagnosis not present

## 2020-03-12 DIAGNOSIS — R0602 Shortness of breath: Secondary | ICD-10-CM | POA: Diagnosis present

## 2020-03-12 DIAGNOSIS — I5043 Acute on chronic combined systolic (congestive) and diastolic (congestive) heart failure: Secondary | ICD-10-CM | POA: Diagnosis not present

## 2020-03-12 DIAGNOSIS — Z79899 Other long term (current) drug therapy: Secondary | ICD-10-CM | POA: Diagnosis not present

## 2020-03-12 DIAGNOSIS — I509 Heart failure, unspecified: Secondary | ICD-10-CM

## 2020-03-12 LAB — BASIC METABOLIC PANEL
Anion gap: 8 (ref 5–15)
BUN: 12 mg/dL (ref 6–20)
CO2: 32 mmol/L (ref 22–32)
Calcium: 8.8 mg/dL — ABNORMAL LOW (ref 8.9–10.3)
Chloride: 94 mmol/L — ABNORMAL LOW (ref 98–111)
Creatinine, Ser: 0.54 mg/dL (ref 0.44–1.00)
GFR calc Af Amer: >60 mL/min (ref >60)
GFR calc non Af Amer: >60 mL/min (ref >60)
Glucose, Bld: 132 mg/dL — ABNORMAL HIGH (ref 70–99)
Potassium: 3.9 mmol/L (ref 3.5–5.1)
Sodium: 134 mmol/L — ABNORMAL LOW (ref 135–145)

## 2020-03-12 LAB — CBC
HCT: 37.2 % (ref 36.0–46.0)
Hemoglobin: 11.9 g/dL — ABNORMAL LOW (ref 12.0–15.0)
MCH: 28.6 pg (ref 26.0–34.0)
MCHC: 32 g/dL (ref 30.0–36.0)
MCV: 89.4 fL (ref 80.0–100.0)
Platelets: 193 10*3/uL (ref 150–400)
RBC: 4.16 MIL/uL (ref 3.87–5.11)
RDW: 13.5 % (ref 11.5–15.5)
WBC: 6.6 10*3/uL (ref 4.0–10.5)
nRBC: 0 % (ref 0.0–0.2)

## 2020-03-12 LAB — TROPONIN I (HIGH SENSITIVITY)
Troponin I (High Sensitivity): 27 ng/L — ABNORMAL HIGH (ref <18)
Troponin I (High Sensitivity): 27 ng/L — ABNORMAL HIGH (ref <18)

## 2020-03-12 MED ORDER — IPRATROPIUM-ALBUTEROL 0.5-2.5 (3) MG/3ML IN SOLN
3.0000 mL | Freq: Once | RESPIRATORY_TRACT | Status: AC
  Administered 2020-03-12: 3 mL via RESPIRATORY_TRACT
  Filled 2020-03-12: qty 3

## 2020-03-12 MED ORDER — MORPHINE SULFATE (PF) 2 MG/ML IV SOLN
2.0000 mg | Freq: Once | INTRAVENOUS | Status: AC
  Administered 2020-03-12: 2 mg via INTRAVENOUS
  Filled 2020-03-12: qty 1

## 2020-03-12 MED ORDER — SUCRALFATE 1 GM/10ML PO SUSP
1.0000 g | Freq: Once | ORAL | Status: DC
  Filled 2020-03-12: qty 10

## 2020-03-12 MED ORDER — LABETALOL HCL 5 MG/ML IV SOLN
10.0000 mg | Freq: Once | INTRAVENOUS | Status: AC
  Administered 2020-03-12: 10 mg via INTRAVENOUS
  Filled 2020-03-12: qty 4

## 2020-03-12 MED ORDER — TRAMADOL HCL 50 MG PO TABS
50.0000 mg | ORAL_TABLET | Freq: Once | ORAL | Status: AC
  Administered 2020-03-12: 50 mg via ORAL
  Filled 2020-03-12: qty 1

## 2020-03-12 MED ORDER — FUROSEMIDE 10 MG/ML IJ SOLN
40.0000 mg | Freq: Once | INTRAMUSCULAR | Status: AC
  Administered 2020-03-12: 40 mg via INTRAVENOUS
  Filled 2020-03-12: qty 4

## 2020-03-12 MED ORDER — PREDNISONE 50 MG PO TABS: 50.0000 mg | ORAL_TABLET | Freq: Every day | ORAL | 0 refills | Status: AC

## 2020-03-12 MED ORDER — METHYLPREDNISOLONE SODIUM SUCC 125 MG IJ SOLR
125.0000 mg | Freq: Once | INTRAMUSCULAR | Status: AC
  Administered 2020-03-12: 125 mg via INTRAVENOUS
  Filled 2020-03-12: qty 2

## 2020-03-12 MED ORDER — DIVALPROEX SODIUM 250 MG PO DR TAB: 250.0000 mg | DELAYED_RELEASE_TABLET | Freq: Once | ORAL | Status: DC

## 2020-03-12 NOTE — ED Notes (Signed)
Called Terressa Koyanagi ([place]) Office: [phone], Cell: [phone] legal guardian of pt. Informed her that pt was here in the ED to be evaluated.

## 2020-03-12 NOTE — ED Notes (Signed)
O2 tank changed out to a full one. Patient placed on 3L

## 2020-03-12 NOTE — Discharge Instructions (Signed)
Patient with reassuring workup today in the ED. CXR is at her baseline. No increased O2 requirement. Given lasix, steroids and duonebs with significant improvement. Close follow up with PCP recommended

## 2020-03-12 NOTE — ED Triage Notes (Signed)
Pt comes via EMS from [facility] care with c/o chest tightness and some SOB that started 3-4 days ago. Pt wears 3L [place] chronically.  EMS gave duoneb. BP-142/102,BS-149, RR-22. Pt states she just quit smoking week ago. Pt states she just doesn't feel like she breathing right. Pt states pain all over.  Pt states COVID test yesterday and it was negative.

## 2020-03-12 NOTE — ED Notes (Signed)
Pt given snack per her request: diet cola and graham crackers

## 2020-03-12 NOTE — ED Notes (Signed)
Provided update to PCP Vickii Chafe, NP via telephone to include discharge plan of care

## 2020-03-12 NOTE — ED Provider Notes (Signed)
[facility] Emergency Department Provider Note   ____________________________________________    I have reviewed the triage vital signs and the nursing notes.   HISTORY  Chief Complaint Shortness of breath    HPI Natalie Thomas is a 59 y.o. female with a history of CHF, anxiety, diabetes, COPD on chronic oxygen 3 L who presents today with complaints of shortness of breath.  She noticed that her breathing over the last 2 to 3 days seems more labored than typical.  She denies fevers or chills.  Had negative Covid swab yesterday.  Denies chest pain to me.  No significant cough.  Reviewed medical record, patient last admitted in August for shortness of breath required diuresis at that time  Past Medical History:  Diagnosis Date   Anemia 10/16/2017   Anxiety and depression    CHF (congestive heart failure) ([place])    Chronic hyponatremia 04/08/2007   Qualifier: Diagnosis of  By: Marca Ancona RMA, Lucy     Closed comminuted intertrochanteric fracture of right femur ([place]) 11/07/2017   COPD 04/08/2007   Qualifier: Diagnosis of  By: Marca Ancona RMA, Lucy     Depression    Diabetes mellitus, type 2 ([place])    pt denies but states that she has been treated for DM   Essential hypertension 04/08/2007   Qualifier: Diagnosis of  By: Marca Ancona RMA, Lucy     GERD (gastroesophageal reflux disease)    Hyperlipidemia 10/07/2017   Lumbar disc disease 04/09/2011   MENOPAUSE, PREMATURE 04/08/2007   Qualifier: Diagnosis of  By: Marca Ancona RMA, Lucy     NEOPLASM, MALIGNANT, VULVA 04/08/2007   Qualifier: Diagnosis of  By: Marca Ancona RMA, Lucy     Osteoporosis 04/08/2007   Qualifier: Diagnosis of  By: Marca Ancona RMA, Lucy     Polycythemia secondary to smoking 04/09/2011   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   Schizoaffective disorder, bipolar type ([place]) 07/05/2011    Patient Active Problem List   Diagnosis Date Noted   Acute CHF (congestive heart failure) ([place]) 37/29/0211   Acute  diastolic (congestive) heart failure ([place]) 02/13/2020   Acute respiratory failure ([place]) 10/18/2019   DNR (do not resuscitate) discussion    Goals of care, counseling/discussion    Palliative care by specialist    Acute on chronic respiratory failure ([place]) 10/01/2019   Acute on chronic diastolic CHF (congestive heart failure) ([place])    Shortness of breath 09/22/2019   Acute on chronic respiratory failure with hypercapnia ([place]) 09/13/2019   Leukocytosis 09/13/2019   Class 2 obesity with body mass index (BMI) of 39.0 to 39.9 in adult    Acute on chronic respiratory failure with hypoxemia ([place]) 09/04/2019   COVID-19 virus infection    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   Nausea    Acute gastritis    Abdominal pain, chronic, epigastric    Hyponatremia 07/09/2019   Ventral hernia 07/09/2019   Hypertensive crisis 05/01/2019   Acute abdominal pain 03/15/2019   Pleural effusion 03/13/2019   Atelectasis 03/13/2019   Acute on chronic combined systolic and diastolic congestive heart failure ([place]) 03/13/2019   Type 2 diabetes mellitus without complication ([employer]) 15/52/0802   Urinary tract bacterial infections 03/09/2019   Acute cystitis without hematuria    Constipation    Acute hypoxemic respiratory failure ([place]) 12/03/2018   Irritability 11/09/2018   Aggressive behavior 11/09/2018   Agitation    S/P right hip fracture 11/07/2017   Closed comminuted intertrochanteric fracture of right femur ([place]) 11/07/2017   Anxiety and depression  Dyspnea 10/16/2017   Anemia 10/16/2017   COPD exacerbation ([place]) 10/07/2017   Hyperlipidemia 10/07/2017   Chronic respiratory failure with hypoxia ([place]) 10/07/2017   Acute on chronic respiratory failure with hypoxia and hypercapnia (HCC) 10/07/2017   Incontinence of urine 08/14/2011   Schizoaffective disorder, bipolar type ([place]) 07/05/2011   Cough 05/08/2011   Lumbar disc disease 04/09/2011   Polycythemia  secondary to smoking 04/09/2011   HIP PAIN, LEFT 12/12/2007   NEOPLASM, MALIGNANT, VULVA 04/08/2007   DM (diabetes mellitus), type 2 ([place]) 04/08/2007   MENOPAUSE, PREMATURE 04/08/2007   Chronic hyponatremia 04/08/2007   Schizoaffective disorder ([place]) 04/08/2007   Essential hypertension 04/08/2007   COPD (chronic obstructive pulmonary disease) ([place]) 04/08/2007   GERD 04/08/2007   Osteoporosis 04/08/2007    Past Surgical History:  Procedure Laterality Date   BIOPSY  07/11/2019   Procedure: BIOPSY;  Surgeon: Jerene Bears, MD;  Location: [facility];  Service: Gastroenterology;;   ESOPHAGOGASTRODUODENOSCOPY (EGD) WITH PROPOFOL N/A 07/11/2019   Procedure: ESOPHAGOGASTRODUODENOSCOPY (EGD) WITH PROPOFOL;  Surgeon: Jerene Bears, MD;  Location: [facility];  Service: Gastroenterology;  Laterality: N/A;   EYE SURGERY     on left  eye, pt states that she sees bad out of the right eye and needs surgery there   INTRAMEDULLARY (IM) NAIL INTERTROCHANTERIC Right 11/13/2017   Procedure: INTRAMEDULLARY (IM) NAIL INTERTROCHANTRIC;  Surgeon: Shona Needles, MD;  Location: [facility];  Service: Orthopedics;  Laterality: Right;   PELVIC FRACTURE SURGERY      Prior to Admission medications   Medication Sig Start Date End Date Taking? Authorizing Provider  acetaminophen (TYLENOL) 500 MG tablet Take 1,000 mg by mouth every 8 (eight) hours as needed for mild pain or moderate pain. Not to exceed 3g/24h of tylenol from all sources.    [provider]  albuterol (PROVENTIL) (2.5 MG/3ML) 0.083% nebulizer solution Take 3 mLs (2.5 mg total) by nebulization every 4 (four) hours as needed for wheezing or shortness of breath. 12/11/18   Swayze, Ava, DO  albuterol (VENTOLIN HFA) 108 (90 Base) MCG/ACT inhaler Inhale 2 puffs into the lungs every 6 (six) hours as needed for wheezing or shortness of breath.    [provider]  Alum & Mag Hydroxide-Simeth (ANTACID LIQUID PO) Take 30 mLs by mouth  every 6 (six) hours as needed (heartburn).    [provider]  ascorbic acid (VITAMIN C) 1000 MG tablet Take 1 tablet (1,000 mg total) by mouth daily. 09/17/19   Lorella Nimrod, MD  aspirin 81 MG EC tablet Take 1 tablet (81 mg total) by mouth daily. 02/19/20   Ezekiel Slocumb, DO  buPROPion (WELLBUTRIN SR) 150 MG 12 hr tablet Take 150 mg by mouth 2 (two) times daily. 02/14/20 05/08/20  [provider]  Calcium Carb-Cholecalciferol 600-800 MG-UNIT TABS Take 1 tablet by mouth daily.     [provider]  clonazePAM (KLONOPIN) 0.5 MG tablet Take 0.5 mg by mouth 2 (two) times daily.     [provider]  divalproex (DEPAKOTE) 250 MG DR tablet Take 250 mg by mouth 3 (three) times daily.    [provider]  docusate sodium (COLACE) 100 MG capsule Take 100 mg by mouth every 12 (twelve) hours as needed for mild constipation or moderate constipation.     [provider]  fluticasone furoate-vilanterol (BREO ELLIPTA) 100-25 MCG/INH AEPB Inhale 1 puff into the lungs daily.    [provider]  furosemide (LASIX) 40 MG tablet Take 1 tablet (  40 mg total) by mouth daily. 02/20/20   Ezekiel Slocumb, DO  guaiFENesin (ROBITUSSIN) 100 MG/5ML SOLN Take 15 mLs by mouth every 4 (four) hours as needed for cough or to loosen phlegm.    [provider]  guaiFENesin-dextromethorphan (ROBITUSSIN DM) 100-10 MG/5ML syrup Take 5 mLs by mouth every 4 (four) hours as needed for cough. 02/19/20   Nicole Kindred A, DO  hydrOXYzine (ATARAX/VISTARIL) 25 MG tablet Take 25 mg by mouth every 6 (six) hours as needed for anxiety or itching.    [provider]  ipratropium-albuterol (DUONEB) 0.5-2.5 (3) MG/3ML SOLN Take 3 mLs by nebulization 3 (three) times daily. 10/25/19   Swayze, Ava, DO  loratadine (CLARITIN) 10 MG tablet Take 10 mg by mouth daily.    [provider]  LORazepam (ATIVAN) 1 MG tablet Take 1 mg by mouth every 4 (four) hours as needed for  anxiety.    [provider]  lurasidone (LATUDA) 20 MG TABS tablet Take 1 tablet (20 mg total) by mouth 2 (two) times daily. 05/15/19   Hongalgi, Lenis Dickinson, MD  magnesium hydroxide (MILK OF MAGNESIA) 400 MG/5ML suspension Take 30 mLs by mouth daily as needed for mild constipation or moderate constipation. 10/25/19   Swayze, Ava, DO  Melatonin 10 MG TABS Take 10 mg by mouth at bedtime.     [provider]  metoprolol succinate (TOPROL-XL) 25 MG 24 hr tablet Take 0.5 tablets (12.5 mg total) by mouth daily. 10/26/19   Swayze, Ava, DO  Multiple Vitamin (MULTIVITAMIN WITH MINERALS) TABS tablet Take 1 tablet by mouth daily. 03/15/19   Oretha Milch D, MD  nicotine (NICODERM CQ - DOSED IN MG/24 HOURS) 21 mg/24hr patch Place 21 mg onto the skin daily.     [provider]  NIFEdipine (ADALAT CC) 60 MG 24 hr tablet Take 1 tablet (60 mg total) by mouth daily. 10/26/19   Swayze, Ava, DO  nystatin (NYSTATIN) powder Apply 1 application topically 2 (two) times daily as needed (irritation). Apply topically to reddened area twice daily as needed for irritation    [provider]  OLANZapine (ZYPREXA) 5 MG tablet Take 10 mg by mouth 2 (two) times daily.     [provider]  ondansetron (ZOFRAN) 8 MG tablet Take 8 mg by mouth every 6 (six) hours as needed for nausea or vomiting.    [provider]  pantoprazole (PROTONIX) 40 MG tablet Take 40 mg by mouth daily.    [provider]  polyethylene glycol (MIRALAX) 17 g packet Take 17 g by mouth daily. Patient taking differently: Take 17 g by mouth daily. Mix 1 capful (17 g) in 4-8 oz of water or juice and give by mouth once daily for bowel regimen 12/11/18   Swayze, Ava, DO  predniSONE (DELTASONE) 50 MG tablet Take 1 tablet (50 mg total) by mouth daily with breakfast. 03/12/20   Lavonia Drafts, MD  sodium chloride (OCEAN) 0.65 % SOLN nasal spray Place 2 sprays into both nostrils every 2 (two) hours as needed (dryness,  irritation).    [provider]  sucralfate (CARAFATE) 1 GM/10ML suspension Take 10 mLs (1 g total) by mouth 4 (four) times daily -  with meals and at bedtime. 07/16/19   Mercy Riding, MD     Allergies Clarithromycin and Penicillins  No family history on file.  Social History Social History   Tobacco Use   Smoking status: Former Smoker    Packs/day: 0.25  Types: Cigarettes    Quit date: 07/23/2019    Years since quitting: 0.6   Smokeless tobacco: Never Used  Vaping Use   Vaping Use: Never used  Substance Use Topics   Alcohol use: No   Drug use: No    Review of Systems  Constitutional: No fever/chills Eyes: No visual changes.  ENT: No sore throat. Cardiovascular: Denies chest pain. Respiratory: As above Gastrointestinal: No abdominal pain.  No nausea, no vomiting.   Genitourinary: Negative for dysuria. Musculoskeletal: Negative for back pain. Skin: Negative for rash. Neurological: Negative for headaches   ____________________________________________   PHYSICAL EXAM:  VITAL SIGNS: ED Triage Vitals  Enc Vitals Group     BP 03/12/20 0734 (!) 137/92     Pulse Rate 03/12/20 0734 79     Resp 03/12/20 0734 (!) 21     Temp 03/12/20 0734 98 F (36.7 C)     Temp Source 03/12/20 1211 Oral     SpO2 03/12/20 0734 96 %     Weight 03/12/20 0735 95.3 kg (210 lb)     Height 03/12/20 0735 1.651 m (5\' 5" )     Head Circumference --      Peak Flow --      Pain Score 03/12/20 0735 6     Pain Loc --      Pain Edu? --      Excl. in [place]? --     Constitutional: Alert and oriented.   Nose: No congestion/rhinnorhea. Mouth/Throat: Mucous membranes are moist.    Cardiovascular: Normal rate, regular rhythm. Grossly normal heart sounds.  Good peripheral circulation. Respiratory: Mild tachypnea, no retractions, scattered wheezes, prolonged expiratory phase Gastrointestinal: Soft and nontender. No distention.  No CVA tenderness.  Musculoskeletal: No lower  extremity tenderness nor edema.  Warm and well perfused Neurologic:  Normal speech and language. No gross focal neurologic deficits are appreciated.  Skin:  Skin is warm, dry and intact. No rash noted. Psychiatric: Mood and affect are normal. Speech and behavior are normal.  ____________________________________________   LABS (all labs ordered are listed, but only abnormal results are displayed)  Labs Reviewed  BASIC METABOLIC PANEL - Abnormal; Notable for the following components:      Result Value   Sodium 134 (*)    Chloride 94 (*)    Glucose, Bld 132 (*)    Calcium 8.8 (*)    All other components within normal limits  CBC - Abnormal; Notable for the following components:   Hemoglobin 11.9 (*)    All other components within normal limits  TROPONIN I (HIGH SENSITIVITY) - Abnormal; Notable for the following components:   Troponin I (High Sensitivity) 27 (*)    All other components within normal limits  TROPONIN I (HIGH SENSITIVITY) - Abnormal; Notable for the following components:   Troponin I (High Sensitivity) 27 (*)    All other components within normal limits   ____________________________________________  EKG  ED ECG REPORT I, Lavonia Drafts, the attending physician, personally viewed and interpreted this ECG.  Date: 03/12/2020  Rhythm: normal sinus rhythm QRS Axis: normal Intervals: normal ST/T Wave abnormalities: Occasional PVCs Narrative Interpretation: no evidence of acute ischemia  ____________________________________________  RADIOLOGY  Chest x-ray reviewed by me, possibly mild interstitial edema, no effusions ____________________________________________   PROCEDURES  Procedure(s) performed: No  Procedures   Critical Care performed: No ____________________________________________   INITIAL IMPRESSION / ASSESSMENT AND PLAN / ED COURSE  Pertinent labs & imaging results that were available during my care of  the patient were reviewed by me and  considered in my medical decision making (see chart for details).  Patient with chronic respiratory failure related to COPD and CHF presents with complaints of mild shortness of breath.  Overall she is well-appearing and in no acute distress.  She is on her normal 3 L nasal cannula and satting well.  She does have some scattered wheezes which are likely chronic.  No rales on exam.  Will treat with DuoNeb, Solu-Medrol while we await chest x-ray  Chest x-ray viewed by me, demonstrates possible mild residual edema, again likely chronic based on medical record review.  Treated with IV Lasix with excellent diuresis, patient has been at least 1 L  Lab work is unremarkable, white blood cell count is normal.  Delta troponin unchanged and consistent with her chronically elevated troponins.  Upon reeval patient reports she is feeling better, she is resting quietly, oxygen saturations 94 to 96%.  Blood pressure 134/81 at this time, she did have some elevated blood pressures during her stay here, improved significantly with labetalol.  Discussed discharge with patient, she is comfortable with this plan back to the [facility], she knows she can return anytime if any worsening symptoms.  Will Rx prednisone, close follow-up with CHF clinic.   ____________________________________________   FINAL CLINICAL IMPRESSION(S) / ED DIAGNOSES  Final diagnoses:  COPD exacerbation ([place])  Acute on chronic congestive heart failure, unspecified heart failure type [facility])        Note:  This document was prepared using Dragon voice recognition software and may include unintentional dictation errors.   Lavonia Drafts, MD 03/12/20 [phone]

## 2020-03-15 NOTE — Progress Notes (Deleted)
   Patient ID: Natalie Thomas, female    DOB: Nov 03, 1960, 59 y.o.   MRN: [id number]  HPI  Natalie Thomas is a 59 y/o female with a history of  Echo report from 10/21/19 reviewed and showed an EF of 50-55% along with mild/moderate MR.   Was in the ED 03/12/20 due to shortness of breath. Given solu-medrol and nebulizer treatment due to wheezing. CXR showed edema so IV lasix given with resultant loss of >1L. Released back to facility. Admitted twice August 2021.   She presents today for her initial visit with a chief complaint of  Review of Systems    Physical Exam    Assessment & Plan:  1: Chronic heart failure with preserved ejection fraction without structural changes- - NYHA class - palliative care visit done 02/24/20 - BNP 02/13/20 was 732.2  2: HTN- - BP - currently seeing PCP at facility - [facility] 03/12/20 reviewed and showed sodium 134, potassium 3.9, creatinine 0.54 and GFR >60  3: DM-  - A1c 10/23/19 was 5.6%

## 2020-03-16 ENCOUNTER — Ambulatory Visit: Payer: Medicare Other | Admitting: Family

## 2020-03-16 ENCOUNTER — Non-Acute Institutional Stay: Payer: Medicare Other | Admitting: Nurse Practitioner

## 2020-03-16 ENCOUNTER — Telehealth: Payer: Self-pay | Admitting: Family

## 2020-03-16 ENCOUNTER — Encounter: Payer: Self-pay | Admitting: Nurse Practitioner

## 2020-03-16 ENCOUNTER — Other Ambulatory Visit: Payer: Self-pay

## 2020-03-16 DIAGNOSIS — I509 Heart failure, unspecified: Secondary | ICD-10-CM

## 2020-03-16 DIAGNOSIS — Z515 Encounter for palliative care: Secondary | ICD-10-CM

## 2020-03-16 NOTE — Progress Notes (Signed)
[facility] Consult Note Telephone: [phone]  Fax: [phone]  PATIENT NAME: Natalie Thomas DOB: [birth year] MRN: [id number]   PRIMARY CARE PROVIDER:Dr Yves Dill PROVIDER:Dr Hodges/[place] [phone], [phone]  RECOMMENDATIONS and PLAN: 1.ACP:DNR in place; Hospice order  2.Palliative care encounter; Palliative medicine team will continue to support patient, patient's family, and medical team. Visit consisted of counseling and education dealing with the complex and emotionally intense issues of symptom management and palliative care in the setting of serious and potentially life-threatening illness  I spent 65 minutes providing this consultation, start at 12:30pm. More than 50% of the time in this consultation was spent coordinating communication.   HISTORY OF PRESENT ILLNESS:  Natalie Thomas is a 59 y.o. year old female with multiple medical problems including Natalie Thomas is a 59 y.o. year old female with multiple medical problems including Malignant neoplasm volvulus, polycythemia, diastolic congestive heart failure, history of pleural effusion, chronic respiratory failure with hypoxia and hypercapnia, anemia, COPD, hypertension, diabetes, chronic hyponatremia, hyperlipidemia, gerd, lumbar disc disease, osteoporosis, schizoaffective disorder bipolar type, anxiety, depression, right intramedullary nail intertrochanteric, pelvic surgery for fracture, eye surgery.     Hospitalized 3 / 05/2020 to 3 /16 /2021 for acute on chronic respiratory failure with hypoxia secondary to covid pneumonia and congestive heart failure   Hospitalized 09/13/2019 to 3 / 24 / 2021 for acute on chronic respiratory failure with hypoxia, hypercapnia requiring BiPAP, chronic hyponatremia   Hospitalized 3 / 30 / 2021 to 4 / 3 / 2021 for worsening shortness of breath  with work up showing bilateral pleural effusions, pulmonary edema admitted for acute exacerbation of chronic systolic congestive heart failure and bilateral pleural effusions. She didn't undergo a left-sided thoracentesis.   Hospitalized for 01/2020 to 4 / 12 / 2021 for acute on chronic respiratory failure due to acute on chronic systolic and diastolic congestive heart failure   Hospitalized 4 / 25/2021 to 5 / 3 / 2021 for increase work of breathing requiring CPAP then BiPAP and drinking significant amount of water. We're going to significant for acute on chronic hypoxic respiratory failure secondary to congestive heart failure combined Systolic/Diastolic    Hospitalize 8 /06/2019 to 02/02/2020 for nausea, abdominal discomfort, malaise with chest x-ray noted for chronic cardiomegaly and Vascular congestion with improved pulmonary edema from prior study. CT abdomen and pelvis increase fat and foul containing umbilical hernia without any obstruction or vascular compromise. Sodium 125. 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 Chronic abdominal pain. Chronic diastolic heart failure. Chronic hypoxic respiratory failure continued on oxygen. Schizoaffective.   Hospitalized 02/13/2020 to 02/19/2020 for acute on chronic diastolic heart failure, chronic respiratory failure, 6LNC, CM with small pleural effusions, COPD exacerbation, hyponatremia   03/12/2020 to [facility] ED for COPD exacerbation  Follow up Palliative care visit for Natalie Thomas with on going to decline. Natalie Thomas does continue to be total ADL dependence with incontinence. Natalie Thomas requires staff to assist for turning position, transfers, bathing, dressing. Natalie Thomas does continue to have a declined appetite and requires assistance with feeding. Natalie Thomas does become very short of breath with attempting to eat, speak as she does remain on continuous oxygen. Natalie Thomas  does desaturate quickly with any type of exertion or activity. Natalie Thomas currently has pneumonia and being treated with antibiotics with on going decline. Natalie Thomas is under DSS Guardianship  with wishes to proceed with Hospice Services if eligible. At present Natalie Thomas is lying in bed, appears chronically ill, mild respiratory difficulties, audible wheezing, debilitated, no visitors present. I visited and observed Natalie Thomas. Natalie Thomas was a little confused on time and date she was oriented to self. Natalie Thomas did answer questions tonight symptoms of pain. Nebulizer treatment received. Natalie Thomas endorses she was having more trouble sleeping and one of the sleeping pill. We talked about quality of life. Limited discussion with cognitive impairment and shortness of breath. Natalie Thomas was cooperative with assessment. Emotional support provided. Willl have Hospice Physicians review for eligibility and deemed eligible so we'll proceed with hospice order. I updated nursing staff.  Palliative Care was asked to help to continue to address goals of care.   CODE STATUS: DNR  PPS: 30% HOSPICE ELIGIBILITY/DIAGNOSIS: yes per Hospice Physicians   PAST MEDICAL HISTORY:  Past Medical History:  Diagnosis Date  . Anemia 10/16/2017  . Anxiety and depression   . CHF (congestive heart failure) ([place])   . Chronic hyponatremia 04/08/2007   Qualifier: Diagnosis of  By: Marca Ancona RMA, Lucy    . Closed comminuted intertrochanteric fracture of right femur ([place]) 11/07/2017  . COPD 04/08/2007   Qualifier: Diagnosis of  By: Marca Ancona RMA, Lucy    . Depression   . Diabetes mellitus, type 2 ([place])    pt denies but states that she has been treated for DM  . Essential hypertension 04/08/2007   Qualifier: Diagnosis of  By: Marca Ancona RMA, Lucy    . GERD (gastroesophageal reflux disease)   . Hyperlipidemia 10/07/2017  . Lumbar disc disease 04/09/2011  . MENOPAUSE, PREMATURE 04/08/2007   Qualifier: Diagnosis of  By: Marca Ancona RMA, Lucy      . 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 Osteoporosis 04/08/2007   Qualifier: Diagnosis of  By: Reatha Armour, Lucy    . Polycythemia secondary to smoking 04/09/2011  . 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 Schizoaffective disorder, bipolar type ([place]) 07/05/2011    SOCIAL HX:  Social History   Tobacco Use  . Smoking status: Former Smoker    Packs/day: 0.25    Types: Cigarettes    Quit date: 07/23/2019    Years since quitting: 0.6  . Smokeless tobacco: Never Used  Substance Use Topics  . Alcohol use: No    ALLERGIES:  Allergies  Allergen Reactions  . Clarithromycin Itching  . Penicillins Itching    Has tolerated cefazolin before  Has patient had a PCN reaction causing immediate rash, facial/tongue/throat swelling, SOB or lightheadedness with hypotension: No Has patient had a PCN reaction causing severe rash involving mucus membranes or skin necrosis: No Has patient had a PCN reaction that required hospitalization: Unknown Has patient had a PCN reaction occurring within the last 10 years: No If all of the above answers are "NO", then may proceed with Cephalosporin use.      PERTINENT MEDICATIONS:  Outpatient Encounter Medications as of 03/16/2020  Medication Sig  . acetaminophen (TYLENOL) 500 MG tablet Take 1,000 mg by mouth every 8 (eight) hours as needed for mild pain or moderate pain. Not to exceed 3g/24h of tylenol from all sources.  Marland Kitchen albuterol (PROVENTIL) (2.5 MG/3ML) 0.083% nebulizer solution Take 3 mLs (2.5 mg total) by nebulization every 4 (four) hours as needed for wheezing or shortness of breath.  Marland Kitchen albuterol (VENTOLIN HFA) 108 (90 Base) MCG/ACT inhaler Inhale 2 puffs into the  lungs every 6 (six) hours as needed for wheezing or shortness of breath.  . Alum & Mag Hydroxide-Simeth (ANTACID LIQUID PO) Take 30 mLs by mouth every 6 (six) hours as needed (heartburn).  Marland Kitchen ascorbic acid (VITAMIN C) 1000 MG tablet Take 1 tablet  (1,000 mg total) by mouth daily.  Marland Kitchen aspirin 81 MG EC tablet Take 1 tablet (81 mg total) by mouth daily.  Marland Kitchen buPROPion (WELLBUTRIN SR) 150 MG 12 hr tablet Take 150 mg by mouth 2 (two) times daily.  . Calcium Carb-Cholecalciferol 600-800 MG-UNIT TABS Take 1 tablet by mouth daily.   . clonazePAM (KLONOPIN) 0.5 MG tablet Take 0.5 mg by mouth 2 (two) times daily.   . divalproex (DEPAKOTE) 250 MG DR tablet Take 250 mg by mouth 3 (three) times daily.  Marland Kitchen docusate sodium (COLACE) 100 MG capsule Take 100 mg by mouth every 12 (twelve) hours as needed for mild constipation or moderate constipation.   . fluticasone furoate-vilanterol (BREO ELLIPTA) 100-25 MCG/INH AEPB Inhale 1 puff into the lungs daily.  . furosemide (LASIX) 40 MG tablet Take 1 tablet (40 mg total) by mouth daily.  Marland Kitchen guaiFENesin (ROBITUSSIN) 100 MG/5ML SOLN Take 15 mLs by mouth every 4 (four) hours as needed for cough or to loosen phlegm.  Marland Kitchen guaiFENesin-dextromethorphan (ROBITUSSIN DM) 100-10 MG/5ML syrup Take 5 mLs by mouth every 4 (four) hours as needed for cough.  . hydrOXYzine (ATARAX/VISTARIL) 25 MG tablet Take 25 mg by mouth every 6 (six) hours as needed for anxiety or itching.  Marland Kitchen ipratropium-albuterol (DUONEB) 0.5-2.5 (3) MG/3ML SOLN Take 3 mLs by nebulization 3 (three) times daily.  Marland Kitchen loratadine (CLARITIN) 10 MG tablet Take 10 mg by mouth daily.  Marland Kitchen LORazepam (ATIVAN) 1 MG tablet Take 1 mg by mouth every 4 (four) hours as needed for anxiety.  Marland Kitchen lurasidone (LATUDA) 20 MG TABS tablet Take 1 tablet (20 mg total) by mouth 2 (two) times daily.  . magnesium hydroxide (MILK OF MAGNESIA) 400 MG/5ML suspension Take 30 mLs by mouth daily as needed for mild constipation or moderate constipation.  . Melatonin 10 MG TABS Take 10 mg by mouth at bedtime.   . metoprolol succinate (TOPROL-XL) 25 MG 24 hr tablet Take 0.5 tablets (12.5 mg total) by mouth daily.  . Multiple Vitamin (MULTIVITAMIN WITH MINERALS) TABS tablet Take 1 tablet by mouth daily.  .  nicotine (NICODERM CQ - DOSED IN MG/24 HOURS) 21 mg/24hr patch Place 21 mg onto the skin daily.   Marland Kitchen NIFEdipine (ADALAT CC) 60 MG 24 hr tablet Take 1 tablet (60 mg total) by mouth daily.  Marland Kitchen nystatin (NYSTATIN) powder Apply 1 application topically 2 (two) times daily as needed (irritation). Apply topically to reddened area twice daily as needed for irritation  . OLANZapine (ZYPREXA) 5 MG tablet Take 10 mg by mouth 2 (two) times daily.   . ondansetron (ZOFRAN) 8 MG tablet Take 8 mg by mouth every 6 (six) hours as needed for nausea or vomiting.  . pantoprazole (PROTONIX) 40 MG tablet Take 40 mg by mouth daily.  . polyethylene glycol (MIRALAX) 17 g packet Take 17 g by mouth daily. (Patient taking differently: Take 17 g by mouth daily. Mix 1 capful (17 g) in 4-8 oz of water or juice and give by mouth once daily for bowel regimen)  . predniSONE (DELTASONE) 50 MG tablet Take 1 tablet (50 mg total) by mouth daily with breakfast.  . sodium chloride (OCEAN) 0.65 % SOLN nasal spray Place 2 sprays into both  nostrils every 2 (two) hours as needed (dryness, irritation).  . sucralfate (CARAFATE) 1 GM/10ML suspension Take 10 mLs (1 g total) by mouth 4 (four) times daily -  with meals and at bedtime.   No facility-administered encounter medications on file as of 03/16/2020.    PHYSICAL EXAM:   General: obese, chronically ill, debilitated, confused female Cardiovascular: regular rate and rhythm Pulmonary: few wheezing; poor air movement Neurological: functionally quadriplegic  [place], NP

## 2020-03-16 NOTE — Telephone Encounter (Signed)
Patient did not show for her [facility] appointment on 03/16/20. Will attempt to reschedule.

## 2020-05-25 DEATH — deceased
